# Patient Record
Sex: Male | Born: 1935 | Race: White | Hispanic: No | Marital: Married
Health system: Southern US, Community
[De-identification: ages and names within clinical notes are randomized; demographics above are authoritative.]

## PROBLEM LIST (undated history)

## (undated) DIAGNOSIS — K589 Irritable bowel syndrome without diarrhea: Secondary | ICD-10-CM

## (undated) DIAGNOSIS — N281 Cyst of kidney, acquired: Secondary | ICD-10-CM

## (undated) DIAGNOSIS — F329 Major depressive disorder, single episode, unspecified: Secondary | ICD-10-CM

## (undated) DIAGNOSIS — I483 Typical atrial flutter: Secondary | ICD-10-CM

## (undated) DIAGNOSIS — I501 Left ventricular failure: Secondary | ICD-10-CM

## (undated) DIAGNOSIS — I499 Cardiac arrhythmia, unspecified: Secondary | ICD-10-CM

## (undated) DIAGNOSIS — I2510 Atherosclerotic heart disease of native coronary artery without angina pectoris: Secondary | ICD-10-CM

## (undated) DIAGNOSIS — E785 Hyperlipidemia, unspecified: Secondary | ICD-10-CM

## (undated) DIAGNOSIS — J449 Chronic obstructive pulmonary disease, unspecified: Secondary | ICD-10-CM

## (undated) DIAGNOSIS — I10 Essential (primary) hypertension: Secondary | ICD-10-CM

## (undated) DIAGNOSIS — N390 Urinary tract infection, site not specified: Secondary | ICD-10-CM

## (undated) DIAGNOSIS — C801 Malignant (primary) neoplasm, unspecified: Secondary | ICD-10-CM

## (undated) DIAGNOSIS — I480 Paroxysmal atrial fibrillation: Secondary | ICD-10-CM

## (undated) DIAGNOSIS — I714 Abdominal aortic aneurysm, without rupture, unspecified: Secondary | ICD-10-CM

## (undated) DIAGNOSIS — M1990 Unspecified osteoarthritis, unspecified site: Secondary | ICD-10-CM

## (undated) DIAGNOSIS — J189 Pneumonia, unspecified organism: Secondary | ICD-10-CM

## (undated) DIAGNOSIS — F32A Depression, unspecified: Secondary | ICD-10-CM

## (undated) DIAGNOSIS — D649 Anemia, unspecified: Secondary | ICD-10-CM

## (undated) DIAGNOSIS — E079 Disorder of thyroid, unspecified: Secondary | ICD-10-CM

## (undated) DIAGNOSIS — I6529 Occlusion and stenosis of unspecified carotid artery: Secondary | ICD-10-CM

## (undated) DIAGNOSIS — I509 Heart failure, unspecified: Secondary | ICD-10-CM

## (undated) DIAGNOSIS — I82409 Acute embolism and thrombosis of unspecified deep veins of unspecified lower extremity: Secondary | ICD-10-CM

## (undated) DIAGNOSIS — IMO0001 Reserved for inherently not codable concepts without codable children: Secondary | ICD-10-CM

## (undated) DIAGNOSIS — K760 Fatty (change of) liver, not elsewhere classified: Secondary | ICD-10-CM

## (undated) DIAGNOSIS — K219 Gastro-esophageal reflux disease without esophagitis: Secondary | ICD-10-CM

## (undated) HISTORY — PX: KNEE SURGERY: SHX244

## (undated) HISTORY — PX: EYE SURGERY: SHX253

## (undated) HISTORY — DX: Pneumonia, unspecified organism: J18.9

## (undated) HISTORY — PX: INGUINAL HERNIA REPAIR: SUR1180

## (undated) HISTORY — DX: Abdominal aortic aneurysm, without rupture, unspecified: I71.40

## (undated) HISTORY — DX: Paroxysmal atrial fibrillation: I48.0

## (undated) HISTORY — PX: HERNIA REPAIR: SHX51

## (undated) HISTORY — PX: CARDIAC CATHETERIZATION: SHX172

## (undated) HISTORY — DX: Disorder of thyroid, unspecified: E07.9

## (undated) HISTORY — DX: Irritable bowel syndrome, unspecified: K58.9

## (undated) HISTORY — DX: Hyperlipidemia, unspecified: E78.5

## (undated) HISTORY — PX: CORONARY ANGIOPLASTY: SHX604

## (undated) HISTORY — DX: Anemia, unspecified: D64.9

## (undated) HISTORY — DX: Essential (primary) hypertension: I10

## (undated) HISTORY — DX: Acute embolism and thrombosis of unspecified deep veins of unspecified lower extremity: I82.409

## (undated) HISTORY — DX: Malignant (primary) neoplasm, unspecified: C80.1

## (undated) HISTORY — DX: Typical atrial flutter: I48.3

## (undated) HISTORY — DX: Depression, unspecified: F32.A

## (undated) HISTORY — DX: Gastro-esophageal reflux disease without esophagitis: K21.9

## (undated) HISTORY — DX: Abdominal aortic aneurysm, without rupture: I71.4

## (undated) HISTORY — DX: Atherosclerotic heart disease of native coronary artery without angina pectoris: I25.10

## (undated) HISTORY — DX: Heart failure, unspecified: I50.9

## (undated) HISTORY — DX: Major depressive disorder, single episode, unspecified: F32.9

## (undated) HISTORY — DX: Occlusion and stenosis of unspecified carotid artery: I65.29

## (undated) HISTORY — DX: Fatty (change of) liver, not elsewhere classified: K76.0

## (undated) HISTORY — DX: Left ventricular failure, unspecified: I50.1

## (undated) HISTORY — DX: Unspecified osteoarthritis, unspecified site: M19.90

---

## 1990-12-26 HISTORY — PX: CORONARY ARTERY BYPASS GRAFT: SHX141

## 2000-12-26 HISTORY — PX: ABDOMINAL AORTIC ANEURYSM REPAIR: SUR1152

## 2001-05-18 ENCOUNTER — Ambulatory Visit (HOSPITAL_COMMUNITY): Admission: RE | Admit: 2001-05-18 | Discharge: 2001-05-18 | Payer: Self-pay | Admitting: *Deleted

## 2001-05-22 ENCOUNTER — Inpatient Hospital Stay (HOSPITAL_COMMUNITY): Admission: AD | Admit: 2001-05-22 | Discharge: 2001-05-24 | Payer: Self-pay | Admitting: Cardiology

## 2001-05-22 ENCOUNTER — Encounter: Payer: Self-pay | Admitting: Cardiology

## 2001-05-23 ENCOUNTER — Encounter: Payer: Self-pay | Admitting: Cardiology

## 2001-09-14 ENCOUNTER — Encounter (INDEPENDENT_AMBULATORY_CARE_PROVIDER_SITE_OTHER): Payer: Self-pay | Admitting: *Deleted

## 2003-08-27 HISTORY — PX: QUADRICEPS TENDON REPAIR: SHX756

## 2003-09-07 ENCOUNTER — Encounter: Payer: Self-pay | Admitting: Emergency Medicine

## 2003-09-07 ENCOUNTER — Encounter: Payer: Self-pay | Admitting: Orthopedic Surgery

## 2003-09-07 ENCOUNTER — Inpatient Hospital Stay (HOSPITAL_COMMUNITY): Admission: EM | Admit: 2003-09-07 | Discharge: 2003-09-11 | Payer: Self-pay | Admitting: Emergency Medicine

## 2003-09-09 ENCOUNTER — Encounter: Payer: Self-pay | Admitting: Orthopedic Surgery

## 2003-09-10 ENCOUNTER — Encounter: Payer: Self-pay | Admitting: Internal Medicine

## 2003-09-11 ENCOUNTER — Inpatient Hospital Stay (HOSPITAL_COMMUNITY)
Admission: RE | Admit: 2003-09-11 | Discharge: 2003-09-18 | Payer: Self-pay | Admitting: Physical Medicine & Rehabilitation

## 2003-12-27 HISTORY — PX: CORONARY ANGIOPLASTY WITH STENT PLACEMENT: SHX49

## 2004-01-21 ENCOUNTER — Ambulatory Visit (HOSPITAL_COMMUNITY): Admission: RE | Admit: 2004-01-21 | Discharge: 2004-01-22 | Payer: Self-pay | Admitting: Cardiology

## 2004-11-23 ENCOUNTER — Ambulatory Visit: Payer: Self-pay | Admitting: Family Medicine

## 2004-12-30 ENCOUNTER — Ambulatory Visit: Payer: Self-pay | Admitting: Cardiology

## 2005-01-11 ENCOUNTER — Ambulatory Visit: Payer: Self-pay

## 2005-02-16 ENCOUNTER — Ambulatory Visit: Payer: Self-pay | Admitting: Family Medicine

## 2005-02-22 ENCOUNTER — Ambulatory Visit: Payer: Self-pay | Admitting: Family Medicine

## 2005-03-21 ENCOUNTER — Ambulatory Visit: Payer: Self-pay | Admitting: Family Medicine

## 2005-06-23 ENCOUNTER — Ambulatory Visit: Payer: Self-pay | Admitting: Family Medicine

## 2005-08-14 ENCOUNTER — Inpatient Hospital Stay (HOSPITAL_COMMUNITY): Admission: EM | Admit: 2005-08-14 | Discharge: 2005-08-15 | Payer: Self-pay | Admitting: Emergency Medicine

## 2005-08-14 ENCOUNTER — Ambulatory Visit: Payer: Self-pay | Admitting: Internal Medicine

## 2005-08-18 ENCOUNTER — Ambulatory Visit: Payer: Self-pay | Admitting: Family Medicine

## 2005-08-25 ENCOUNTER — Ambulatory Visit: Payer: Self-pay | Admitting: Family Medicine

## 2005-09-01 ENCOUNTER — Ambulatory Visit: Payer: Self-pay | Admitting: Family Medicine

## 2005-09-16 ENCOUNTER — Ambulatory Visit: Payer: Self-pay | Admitting: Family Medicine

## 2005-09-28 ENCOUNTER — Ambulatory Visit: Payer: Self-pay | Admitting: Family Medicine

## 2005-10-12 ENCOUNTER — Ambulatory Visit: Payer: Self-pay | Admitting: Family Medicine

## 2005-11-02 ENCOUNTER — Ambulatory Visit: Payer: Self-pay | Admitting: Family Medicine

## 2005-11-10 ENCOUNTER — Ambulatory Visit: Payer: Self-pay | Admitting: Family Medicine

## 2005-11-18 ENCOUNTER — Ambulatory Visit: Payer: Self-pay

## 2005-12-13 ENCOUNTER — Ambulatory Visit: Payer: Self-pay | Admitting: Family Medicine

## 2006-01-13 ENCOUNTER — Ambulatory Visit: Payer: Self-pay | Admitting: Family Medicine

## 2006-01-23 ENCOUNTER — Ambulatory Visit: Payer: Self-pay | Admitting: Cardiology

## 2006-01-27 ENCOUNTER — Ambulatory Visit: Payer: Self-pay | Admitting: Family Medicine

## 2006-02-13 ENCOUNTER — Ambulatory Visit: Payer: Self-pay

## 2006-02-23 ENCOUNTER — Ambulatory Visit: Payer: Self-pay | Admitting: Family Medicine

## 2006-03-27 ENCOUNTER — Ambulatory Visit: Payer: Self-pay | Admitting: Family Medicine

## 2006-04-25 ENCOUNTER — Ambulatory Visit: Payer: Self-pay | Admitting: Family Medicine

## 2006-04-26 ENCOUNTER — Ambulatory Visit: Payer: Self-pay | Admitting: Family Medicine

## 2006-05-10 ENCOUNTER — Ambulatory Visit: Payer: Self-pay | Admitting: Family Medicine

## 2006-05-30 ENCOUNTER — Ambulatory Visit: Payer: Self-pay | Admitting: Family Medicine

## 2006-06-27 ENCOUNTER — Ambulatory Visit: Payer: Self-pay | Admitting: Family Medicine

## 2006-07-27 ENCOUNTER — Ambulatory Visit: Payer: Self-pay | Admitting: Family Medicine

## 2006-08-31 ENCOUNTER — Ambulatory Visit: Payer: Self-pay | Admitting: Family Medicine

## 2006-09-19 ENCOUNTER — Ambulatory Visit: Payer: Self-pay | Admitting: Family Medicine

## 2006-10-03 ENCOUNTER — Ambulatory Visit: Payer: Self-pay | Admitting: Family Medicine

## 2006-10-31 ENCOUNTER — Ambulatory Visit: Payer: Self-pay | Admitting: Family Medicine

## 2006-11-01 ENCOUNTER — Ambulatory Visit: Payer: Self-pay | Admitting: Gastroenterology

## 2006-11-09 ENCOUNTER — Ambulatory Visit: Payer: Self-pay | Admitting: Family Medicine

## 2006-12-01 ENCOUNTER — Ambulatory Visit: Payer: Self-pay | Admitting: Family Medicine

## 2006-12-26 DIAGNOSIS — K760 Fatty (change of) liver, not elsewhere classified: Secondary | ICD-10-CM

## 2006-12-26 HISTORY — DX: Fatty (change of) liver, not elsewhere classified: K76.0

## 2007-01-02 ENCOUNTER — Ambulatory Visit: Payer: Self-pay | Admitting: Family Medicine

## 2007-01-04 ENCOUNTER — Ambulatory Visit: Payer: Self-pay | Admitting: Gastroenterology

## 2007-01-04 LAB — CONVERTED CEMR LAB: BUN: 22 mg/dL (ref 6–23)

## 2007-01-08 ENCOUNTER — Ambulatory Visit: Payer: Self-pay | Admitting: Cardiology

## 2007-02-13 ENCOUNTER — Ambulatory Visit: Payer: Self-pay | Admitting: Family Medicine

## 2007-02-27 ENCOUNTER — Ambulatory Visit: Payer: Self-pay | Admitting: Gastroenterology

## 2007-03-08 ENCOUNTER — Encounter (INDEPENDENT_AMBULATORY_CARE_PROVIDER_SITE_OTHER): Payer: Self-pay | Admitting: Specialist

## 2007-03-08 ENCOUNTER — Ambulatory Visit: Payer: Self-pay | Admitting: Gastroenterology

## 2007-03-08 ENCOUNTER — Encounter (INDEPENDENT_AMBULATORY_CARE_PROVIDER_SITE_OTHER): Payer: Self-pay | Admitting: *Deleted

## 2007-03-13 ENCOUNTER — Ambulatory Visit: Payer: Self-pay | Admitting: Family Medicine

## 2007-03-28 ENCOUNTER — Ambulatory Visit: Payer: Self-pay | Admitting: Family Medicine

## 2007-04-19 ENCOUNTER — Ambulatory Visit: Payer: Self-pay | Admitting: Family Medicine

## 2007-05-31 ENCOUNTER — Ambulatory Visit: Payer: Self-pay | Admitting: Family Medicine

## 2007-06-14 ENCOUNTER — Encounter: Payer: Self-pay | Admitting: Family Medicine

## 2007-06-14 DIAGNOSIS — Z86718 Personal history of other venous thrombosis and embolism: Secondary | ICD-10-CM

## 2007-06-14 DIAGNOSIS — E1169 Type 2 diabetes mellitus with other specified complication: Secondary | ICD-10-CM

## 2007-06-14 DIAGNOSIS — I10 Essential (primary) hypertension: Secondary | ICD-10-CM

## 2007-06-14 DIAGNOSIS — E1159 Type 2 diabetes mellitus with other circulatory complications: Secondary | ICD-10-CM

## 2007-06-14 DIAGNOSIS — E785 Hyperlipidemia, unspecified: Secondary | ICD-10-CM

## 2007-06-14 DIAGNOSIS — K219 Gastro-esophageal reflux disease without esophagitis: Secondary | ICD-10-CM | POA: Insufficient documentation

## 2007-07-03 ENCOUNTER — Ambulatory Visit: Payer: Self-pay | Admitting: Family Medicine

## 2007-07-31 ENCOUNTER — Ambulatory Visit: Payer: Self-pay | Admitting: Family Medicine

## 2007-07-31 LAB — CONVERTED CEMR LAB
INR: 3.1
Prothrombin Time: 21.3 s

## 2007-08-28 ENCOUNTER — Ambulatory Visit: Payer: Self-pay | Admitting: Family Medicine

## 2007-08-28 LAB — CONVERTED CEMR LAB
INR: 1.9
Prothrombin Time: 17.2 s

## 2007-10-16 ENCOUNTER — Ambulatory Visit: Payer: Self-pay | Admitting: Family Medicine

## 2007-10-16 LAB — CONVERTED CEMR LAB: INR: 2.6

## 2007-10-25 ENCOUNTER — Ambulatory Visit: Payer: Self-pay | Admitting: Cardiology

## 2007-11-06 ENCOUNTER — Encounter: Payer: Self-pay | Admitting: Family Medicine

## 2007-11-06 ENCOUNTER — Ambulatory Visit: Payer: Self-pay

## 2007-11-20 ENCOUNTER — Ambulatory Visit: Payer: Self-pay | Admitting: Family Medicine

## 2007-11-20 ENCOUNTER — Ambulatory Visit: Payer: Self-pay | Admitting: Vascular Surgery

## 2007-11-20 DIAGNOSIS — E1165 Type 2 diabetes mellitus with hyperglycemia: Secondary | ICD-10-CM | POA: Insufficient documentation

## 2007-11-20 DIAGNOSIS — E118 Type 2 diabetes mellitus with unspecified complications: Secondary | ICD-10-CM

## 2007-11-20 DIAGNOSIS — IMO0002 Reserved for concepts with insufficient information to code with codable children: Secondary | ICD-10-CM | POA: Insufficient documentation

## 2007-11-21 ENCOUNTER — Ambulatory Visit: Payer: Self-pay | Admitting: Family Medicine

## 2007-12-05 LAB — CONVERTED CEMR LAB
BUN: 21 mg/dL (ref 6–23)
Basophils Absolute: 0 10*3/uL (ref 0.0–0.1)
Chloride: 101 meq/L (ref 96–112)
Eosinophils Relative: 4 % (ref 0–5)
Glucose, Bld: 306 mg/dL — ABNORMAL HIGH (ref 70–99)
HCT: 41.2 % (ref 39.0–52.0)
Hgb A1c MFr Bld: 12.4 % — ABNORMAL HIGH (ref 4.6–6.1)
LDL Cholesterol: 84 mg/dL (ref 0–99)
Lymphocytes Relative: 24 % (ref 12–46)
Monocytes Relative: 10 % (ref 3–12)
Neutro Abs: 3.3 10*3/uL (ref 1.7–7.7)
Neutrophils Relative %: 62 % (ref 43–77)
PSA, Free: 0.3 ng/mL
PSA: 1.38 ng/mL (ref 0.10–4.00)
RDW: 12.5 % (ref 11.5–15.5)
Sodium: 141 meq/L (ref 135–145)
Total CHOL/HDL Ratio: 4.4
VLDL: 44 mg/dL — ABNORMAL HIGH (ref 0–40)
WBC: 5.4 10*3/uL (ref 4.0–10.5)

## 2007-12-10 ENCOUNTER — Encounter: Payer: Self-pay | Admitting: Family Medicine

## 2007-12-10 ENCOUNTER — Encounter: Admission: RE | Admit: 2007-12-10 | Discharge: 2007-12-11 | Payer: Self-pay | Admitting: Family Medicine

## 2008-01-16 ENCOUNTER — Telehealth: Payer: Self-pay | Admitting: Family Medicine

## 2008-01-29 ENCOUNTER — Ambulatory Visit: Payer: Self-pay | Admitting: Family Medicine

## 2008-01-29 DIAGNOSIS — K589 Irritable bowel syndrome without diarrhea: Secondary | ICD-10-CM

## 2008-02-21 LAB — CONVERTED CEMR LAB
Hgb A1c MFr Bld: 9.5 % — ABNORMAL HIGH (ref 4.6–6.0)
Microalb Creat Ratio: 58.5 mg/g — ABNORMAL HIGH (ref 0.0–30.0)
Microalb, Ur: 11.2 mg/dL — ABNORMAL HIGH (ref 0.0–1.9)

## 2008-03-12 ENCOUNTER — Telehealth: Payer: Self-pay | Admitting: Family Medicine

## 2008-05-15 ENCOUNTER — Ambulatory Visit: Payer: Self-pay | Admitting: *Deleted

## 2008-05-22 ENCOUNTER — Ambulatory Visit: Payer: Self-pay | Admitting: Cardiology

## 2008-05-27 ENCOUNTER — Ambulatory Visit: Payer: Self-pay | Admitting: Family Medicine

## 2008-05-27 DIAGNOSIS — M1990 Unspecified osteoarthritis, unspecified site: Secondary | ICD-10-CM | POA: Insufficient documentation

## 2008-09-25 ENCOUNTER — Ambulatory Visit: Payer: Self-pay | Admitting: Family Medicine

## 2008-10-30 ENCOUNTER — Ambulatory Visit: Payer: Self-pay | Admitting: Cardiology

## 2008-11-28 ENCOUNTER — Telehealth: Payer: Self-pay | Admitting: Family Medicine

## 2008-12-10 ENCOUNTER — Ambulatory Visit: Payer: Self-pay | Admitting: Cardiology

## 2008-12-10 ENCOUNTER — Inpatient Hospital Stay (HOSPITAL_COMMUNITY): Admission: AD | Admit: 2008-12-10 | Discharge: 2008-12-12 | Payer: Self-pay | Admitting: Cardiology

## 2008-12-12 ENCOUNTER — Encounter: Payer: Self-pay | Admitting: Cardiology

## 2009-02-03 ENCOUNTER — Ambulatory Visit: Payer: Self-pay | Admitting: Family Medicine

## 2009-02-03 DIAGNOSIS — E559 Vitamin D deficiency, unspecified: Secondary | ICD-10-CM | POA: Insufficient documentation

## 2009-02-03 DIAGNOSIS — R350 Frequency of micturition: Secondary | ICD-10-CM

## 2009-02-03 DIAGNOSIS — E039 Hypothyroidism, unspecified: Secondary | ICD-10-CM

## 2009-02-03 DIAGNOSIS — T50995A Adverse effect of other drugs, medicaments and biological substances, initial encounter: Secondary | ICD-10-CM

## 2009-02-03 DIAGNOSIS — I6529 Occlusion and stenosis of unspecified carotid artery: Secondary | ICD-10-CM

## 2009-02-03 LAB — CONVERTED CEMR LAB
Blood in Urine, dipstick: NEGATIVE
Glucose, Urine, Semiquant: NEGATIVE
Nitrite: NEGATIVE
Specific Gravity, Urine: 1.015

## 2009-02-03 LAB — HM COLONOSCOPY

## 2009-02-05 ENCOUNTER — Encounter: Payer: Self-pay | Admitting: Family Medicine

## 2009-02-05 ENCOUNTER — Ambulatory Visit: Payer: Self-pay

## 2009-02-09 LAB — CONVERTED CEMR LAB
ALT: 41 units/L (ref 0–53)
AST: 34 units/L (ref 0–37)
Albumin: 4 g/dL (ref 3.5–5.2)
Alkaline Phosphatase: 34 units/L — ABNORMAL LOW (ref 39–117)
BUN: 31 mg/dL — ABNORMAL HIGH (ref 6–23)
Basophils Absolute: 0 10*3/uL (ref 0.0–0.1)
Basophils Relative: 0.7 % (ref 0.0–3.0)
CO2: 31 meq/L (ref 19–32)
Calcium: 9.5 mg/dL (ref 8.4–10.5)
Cholesterol: 137 mg/dL (ref 0–200)
Eosinophils Absolute: 0.2 10*3/uL (ref 0.0–0.7)
GFR calc Af Amer: 47 mL/min
Glucose, Bld: 182 mg/dL — ABNORMAL HIGH (ref 70–99)
HCT: 39.4 % (ref 39.0–52.0)
HDL: 35.9 mg/dL — ABNORMAL LOW (ref >39.0)
Hemoglobin: 13.4 g/dL (ref 13.0–17.0)
Hgb A1c MFr Bld: 7.9 % — ABNORMAL HIGH (ref 4.6–6.0)
Monocytes Absolute: 0.5 10*3/uL (ref 0.1–1.0)
Monocytes Relative: 9 % (ref 3.0–12.0)
Neutro Abs: 3.7 10*3/uL (ref 1.4–7.7)
Potassium: 3.8 meq/L (ref 3.5–5.1)
RBC: 4.04 M/uL — ABNORMAL LOW (ref 4.22–5.81)
RDW: 11.7 % (ref 11.5–14.6)
Total CHOL/HDL Ratio: 3.8
Total Protein: 7.1 g/dL (ref 6.0–8.3)
Triglycerides: 143 mg/dL (ref 0–149)

## 2009-02-26 ENCOUNTER — Ambulatory Visit: Payer: Self-pay | Admitting: Cardiology

## 2009-02-26 ENCOUNTER — Ambulatory Visit: Payer: Self-pay | Admitting: Family Medicine

## 2009-02-26 LAB — CONVERTED CEMR LAB
OCCULT 1: NEGATIVE
OCCULT 3: NEGATIVE

## 2009-03-02 ENCOUNTER — Encounter: Payer: Self-pay | Admitting: Family Medicine

## 2009-05-07 ENCOUNTER — Ambulatory Visit: Payer: Self-pay | Admitting: Family Medicine

## 2009-05-21 ENCOUNTER — Ambulatory Visit: Payer: Self-pay | Admitting: *Deleted

## 2009-05-26 LAB — CONVERTED CEMR LAB: Hgb A1c MFr Bld: 6.9 % — ABNORMAL HIGH (ref 4.6–6.5)

## 2009-06-01 ENCOUNTER — Ambulatory Visit: Payer: Self-pay | Admitting: Cardiology

## 2009-11-03 ENCOUNTER — Telehealth (INDEPENDENT_AMBULATORY_CARE_PROVIDER_SITE_OTHER): Payer: Self-pay | Admitting: *Deleted

## 2009-11-04 ENCOUNTER — Ambulatory Visit: Payer: Self-pay | Admitting: Family Medicine

## 2010-01-12 ENCOUNTER — Ambulatory Visit: Payer: Self-pay | Admitting: Vascular Surgery

## 2010-02-09 ENCOUNTER — Encounter: Payer: Self-pay | Admitting: Family Medicine

## 2010-02-10 ENCOUNTER — Encounter: Payer: Self-pay | Admitting: Cardiology

## 2010-02-10 ENCOUNTER — Ambulatory Visit: Payer: Self-pay

## 2010-02-23 ENCOUNTER — Ambulatory Visit: Payer: Self-pay | Admitting: Cardiology

## 2010-02-23 DIAGNOSIS — I25810 Atherosclerosis of coronary artery bypass graft(s) without angina pectoris: Secondary | ICD-10-CM

## 2010-03-16 ENCOUNTER — Encounter (INDEPENDENT_AMBULATORY_CARE_PROVIDER_SITE_OTHER): Payer: Self-pay | Admitting: *Deleted

## 2010-03-18 ENCOUNTER — Ambulatory Visit: Payer: Self-pay | Admitting: Family Medicine

## 2010-03-18 ENCOUNTER — Encounter (INDEPENDENT_AMBULATORY_CARE_PROVIDER_SITE_OTHER): Payer: Self-pay | Admitting: *Deleted

## 2010-03-18 DIAGNOSIS — F325 Major depressive disorder, single episode, in full remission: Secondary | ICD-10-CM | POA: Insufficient documentation

## 2010-03-18 DIAGNOSIS — E669 Obesity, unspecified: Secondary | ICD-10-CM | POA: Insufficient documentation

## 2010-03-23 LAB — CONVERTED CEMR LAB
ALT: 51 units/L (ref 0–53)
AST: 37 units/L (ref 0–37)
Albumin: 4.1 g/dL (ref 3.5–5.2)
Alkaline Phosphatase: 40 units/L (ref 39–117)
Basophils Absolute: 0 10*3/uL (ref 0.0–0.1)
Basophils Relative: 0.4 % (ref 0.0–3.0)
Calcium: 9.3 mg/dL (ref 8.4–10.5)
Cholesterol: 147 mg/dL (ref 0–200)
Creatinine,U: 138.4 mg/dL
Eosinophils Relative: 3.9 % (ref 0.0–5.0)
HCT: 38.7 % — ABNORMAL LOW (ref 39.0–52.0)
HDL: 42 mg/dL (ref >39.00)
Hemoglobin: 13 g/dL (ref 13.0–17.0)
Hgb A1c MFr Bld: 8.6 % — ABNORMAL HIGH (ref 4.6–6.5)
Lymphs Abs: 1 10*3/uL (ref 0.7–4.0)
MCHC: 33.5 g/dL (ref 30.0–36.0)
MCV: 97.8 fL (ref 78.0–100.0)
Microalb Creat Ratio: 328.8 mg/g — ABNORMAL HIGH (ref 0.0–30.0)
Monocytes Absolute: 0.5 10*3/uL (ref 0.1–1.0)
Neutro Abs: 3.6 10*3/uL (ref 1.4–7.7)
PSA: 2.59 ng/mL (ref 0.10–4.00)
Platelets: 165 10*3/uL (ref 150.0–400.0)
Sodium: 142 meq/L (ref 135–145)
TSH: 1.77 microintl units/mL (ref 0.35–5.50)
Vit D, 25-Hydroxy: 36 ng/mL (ref 30–89)

## 2010-03-30 ENCOUNTER — Ambulatory Visit: Payer: Self-pay | Admitting: Family Medicine

## 2010-04-06 ENCOUNTER — Ambulatory Visit: Payer: Self-pay | Admitting: Family Medicine

## 2010-04-06 DIAGNOSIS — R609 Edema, unspecified: Secondary | ICD-10-CM

## 2010-04-08 ENCOUNTER — Ambulatory Visit: Payer: Self-pay | Admitting: Gastroenterology

## 2010-04-08 ENCOUNTER — Encounter (INDEPENDENT_AMBULATORY_CARE_PROVIDER_SITE_OTHER): Payer: Self-pay | Admitting: *Deleted

## 2010-04-10 LAB — CONVERTED CEMR LAB
OCCULT 1: NEGATIVE
OCCULT 2: NEGATIVE
OCCULT 3: NEGATIVE

## 2010-04-13 ENCOUNTER — Encounter: Payer: Self-pay | Admitting: Family Medicine

## 2010-05-12 ENCOUNTER — Ambulatory Visit: Payer: Self-pay | Admitting: Gastroenterology

## 2010-05-12 DIAGNOSIS — Z860100 Personal history of colon polyps, unspecified: Secondary | ICD-10-CM | POA: Insufficient documentation

## 2010-07-01 ENCOUNTER — Ambulatory Visit: Payer: Self-pay | Admitting: Family Medicine

## 2010-07-06 LAB — CONVERTED CEMR LAB: Hgb A1c MFr Bld: 8.9 % — ABNORMAL HIGH (ref 4.6–6.5)

## 2010-07-08 ENCOUNTER — Ambulatory Visit: Payer: Self-pay | Admitting: Family Medicine

## 2010-07-20 ENCOUNTER — Ambulatory Visit: Payer: Self-pay | Admitting: Vascular Surgery

## 2010-08-31 ENCOUNTER — Encounter: Payer: Self-pay | Admitting: Family Medicine

## 2010-08-31 ENCOUNTER — Ambulatory Visit: Payer: Self-pay | Admitting: Vascular Surgery

## 2010-09-09 ENCOUNTER — Ambulatory Visit: Payer: Self-pay | Admitting: Family Medicine

## 2010-09-10 ENCOUNTER — Ambulatory Visit: Payer: Self-pay | Admitting: Cardiology

## 2010-11-09 ENCOUNTER — Ambulatory Visit: Payer: Self-pay | Admitting: Cardiology

## 2010-11-09 DIAGNOSIS — I2510 Atherosclerotic heart disease of native coronary artery without angina pectoris: Secondary | ICD-10-CM | POA: Insufficient documentation

## 2010-11-10 ENCOUNTER — Telehealth: Payer: Self-pay | Admitting: Cardiology

## 2010-11-10 LAB — CONVERTED CEMR LAB
AST: 39 units/L — ABNORMAL HIGH (ref 0–37)
Albumin: 4.1 g/dL (ref 3.5–5.2)
Alkaline Phosphatase: 38 units/L — ABNORMAL LOW (ref 39–117)
Bilirubin, Direct: 0.1 mg/dL (ref 0.0–0.3)
HDL: 41.2 mg/dL (ref >39.00)
Total Bilirubin: 0.6 mg/dL (ref 0.3–1.2)
VLDL: 28.6 mg/dL (ref 0.0–40.0)

## 2011-01-04 ENCOUNTER — Encounter: Payer: Self-pay | Admitting: Family Medicine

## 2011-01-06 ENCOUNTER — Encounter: Payer: Self-pay | Admitting: Family Medicine

## 2011-01-27 NOTE — Letter (Signed)
Summary: [facility]  [facility]   Imported By: Laural Benes 09/20/2010 15:31:31  _____________________________________________________________________  External Attachment:    Type:   Image     Comment:   External Document

## 2011-01-27 NOTE — Assessment & Plan Note (Signed)
Summary: DIABETES F/U / REVIEW [place] // RS   Vital Signs:  Patient profile:   77 year old male Weight:      214 pounds BMI:     33.64 O2 Sat:      93 % Temp:     98 degrees F Pulse rate:   104 / minute Pulse rhythm:   regular BP sitting:   136 / 64  (left arm) Cuff size:   large  Vitals Entered By: Levora Angel, RN (July 08, 2010 9:20 AM) CC: go over labs states "feeling great"  Is Patient Diabetic? Yes Did you bring your meter with you today? 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 with lab tests revealing hemoglobin A1c of 89 He admits he does not follow the diet and has more snacks cookies and sweets His other complaints of that of a pack cough from postnasal drainage and would like something for this  Cardiac-wise no chest pain no shortness of breath or unusual shortness of breath blood pressure has been controlled       Allergies (verified): No Known Drug Allergies  Past History:  Past Medical History: Last updated: 05/06/2010 CORONARY ARTERY DISEASE (ICD-414.00) LEFT VENTRICULAR FAILURE (ICD-428.1) CAROTID ARTERY STENOSIS, RIGHT (ICD-433.10) HYPERTENSION (ICD-401.9) HYPERLIPIDEMIA (ICD-272.4) COUMADIN THERAPY (ICD-V58.61) DVT, HX OF (ICD-V12.51) GERD (ICD-530.81) DIABETES MELLITUS, TYPE II, UNCONTROLLED (ICD-250.02) SPECIAL SCREENING MALIGNANT NEOPLASM OF PROSTATE (ICD-V76.44) HYPOTHYROIDISM (ICD-244.9) ANEMIA (ICD-285.9) VITAMIN D DEFICIENCY (ICD-268.9) UNS ADVRS EFF OTH RX MEDICINAL&BIOLOGICAL SBSTNC (ICD-995.29) ARTHRITIS (ICD-716.90) IRRITABLE BOWEL SYNDROME (ICD-564.1) ENCOUNTER FOR THERAPEUTIC DRUG MONITORING (ICD-V58.83) FREQUENCY, URINARY (ICD-788.41)  Adenomatous Polyp 2008 Diverticulosis  Past Surgical History: Last updated: 04/11/2009 Abdominal aortic aneurysm repair Coronary artery bypass graft...1992 Total knee replacement  Family History: Last updated:  05/12/2010 Family History of Coronary Artery Disease: Brother Family History of Diabetes: Brother and grandmother Neg for stroke Family History of Colon Cancer: Father Brother has "abdominal cancer" unsure which organ ? 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  quit 1985 Alcohol Use - no Drug Use - no  Risk Factors: Smoking Status: quit (04/11/2009)  Review of Systems      See HPI  The patient denies anorexia, fever, weight loss, weight gain, vision loss, decreased hearing, hoarseness, chest pain, syncope, dyspnea on exertion, peripheral edema, prolonged cough, headaches, hemoptysis, abdominal pain, melena, hematochezia, severe indigestion/heartburn, hematuria, incontinence, genital sores, muscle weakness, suspicious skin lesions, transient blindness, difficulty walking, depression, unusual weight change, abnormal bleeding, enlarged lymph nodes, angioedema, breast masses, and testicular masses.    Physical Exam  General:  Well-developed,well-nourished,in no acute distress; alert,appropriate and cooperative throughout examinationoverweight-appearing.   Head:  Normocephalic and atraumatic without obvious abnormalities. No apparent alopecia or balding. Eyes:  No corneal or conjunctival inflammation noted. EOMI. Perrla. Funduscopic exam benign, without hemorrhages, exudates or papilledema. Vision grossly normal. Ears:  External ear exam shows no significant lesions or deformities.  Otoscopic examination reveals clear canals, tympanic membranes are intact bilaterally without bulging, retraction, inflammation or discharge. Hearing is grossly normal bilaterally. Nose:  swollen boggy nasal mucosa with with clear drainage anteriorly and posteriorly Mouth:  Oral mucosa and oropharynx without lesions or exudates.  Teeth in good repair. Lungs:  Normal respiratory effort, chest expands  symmetrically. Lungs are clear to auscultation, no crackles or wheezes. Heart:  Normal rate and regular rhythm. S1 and S2 normal without gallop, murmur,  click, rub or other extra sounds.   Impression & Recommendations:  Problem # 1:  DIABETES MELLITUS, TYPE II, UNCONTROLLED (ICD-250.02) Assessment Deteriorated  His updated medication list for this problem includes:    Aspirin Ec 325 Mg Tbec (Aspirin) .Marland Kitchen... Take one tablet by mouth daily    Diovan 80 Mg Tabs (Valsartan) .Marland Kitchen... Take 1 tablet by mouth once a day    Glucotrol Xl 10 Mg Tb24 (Glipizide) .Marland Kitchen..Marland Kitchen Two times a day    Glucophage 500 Mg Tabs (Metformin hcl) .Marland Kitchen... 1 two times a day for diabetes  Problem # 2:  PERIPHERAL EDEMA (ICD-782.3) Assessment: Improved  His updated medication list for this problem includes:    Furosemide 40 Mg Tabs (Furosemide) .Marland Kitchen... 1 tablet one times a day  Problem # 3:  ANXIETY DEPRESSION (ICD-300.4) Assessment: Improved  Problem # 4:  CAD, AUTOLOGOUS BYPASS GRAFT (ICD-414.02) Assessment: Improved  His updated medication list for this problem includes:    Aspirin Ec 325 Mg Tbec (Aspirin) .Marland Kitchen... Take one tablet by mouth daily    Diovan 80 Mg Tabs (Valsartan) .Marland Kitchen... Take 1 tablet by mouth once a day    Furosemide 40 Mg Tabs (Furosemide) .Marland Kitchen... 1 tablet one times a day    Nitroquick 0.4 Mg Subl (Nitroglycerin) .Marland Kitchen... Place 1 tablet under tongue as directed    Pletal 100 Mg Tabs (Cilostazol) .Marland Kitchen... Take 1 tablet by mouth twice a day    Nifedipine 30 Mg Tb24 (Nifedipine) ..... One by mouth daily    Plavix 75 Mg Tabs (Clopidogrel bisulfate) ..... Once daily    Toprol Xl 50 Mg Xr24h-tab (Metoprolol succinate) ..... One tablet once time a day  Problem # 5:  HYPERTENSION (ICD-401.9) Assessment: Improved  His updated medication list for this problem includes:    Diovan 80 Mg Tabs (Valsartan) .Marland Kitchen... Take 1 tablet by mouth once a day    Furosemide 40 Mg Tabs (Furosemide) .Marland Kitchen... 1 tablet one times a day    Nifedipine  30 Mg Tb24 (Nifedipine) ..... One by mouth daily    Toprol Xl 50 Mg Xr24h-tab (Metoprolol succinate) ..... One tablet once time a day  Problem # 6:  HYPERLIPIDEMIA (ICD-272.4) Assessment: Improved  His updated medication list for this problem includes:    Zetia 10 Mg Tabs (Ezetimibe) .Marland Kitchen... Take 1 tablet by mouth once a day    Simvastatin 80 Mg Tabs (Simvastatin) .Marland Kitchen... Take 1 at  bedtime.    Tricor 145 Mg Tabs (Fenofibrate) ..... Once daily  Problem # 7:  DVT, HX OF (ICD-V12.51) Assessment: Improved  Problem # 8:  ARTHRITIS (ICD-716.90) Assessment: Improved Celebrex 200 mg once or twice daily  Complete Medication List: 1)  Aspirin Ec 325 Mg Tbec (Aspirin) .... Take one tablet by mouth daily 2)  Alprazolam 0.25 Mg Tabs (Alprazolam) .... Take 1 tablet by mouth daily 3)  Diovan 80 Mg Tabs (Valsartan) .... Take 1 tablet by mouth once a day 4)  Furosemide 40 Mg Tabs (Furosemide) .Marland Kitchen.. 1 tablet one times a day 5)  Nitroquick 0.4 Mg Subl (Nitroglycerin) .... Place 1 tablet under tongue as directed 6)  Pletal 100 Mg Tabs (Cilostazol) .... Take 1 tablet by mouth twice a day 7)  Prozac 10 Mg Caps (Fluoxetine hcl) .... 2 qd 8)  Temazepam 30 Mg Caps (Temazepam) 9)  Zetia 10 Mg Tabs (Ezetimibe) .... Take 1 tablet by mouth once a day 10)  Celebrex 200 Mg Caps (Celecoxib) .Marland Kitchen.. 1 two times a day after meals 11)  Nifedipine 30  Mg Tb24 (Nifedipine) .... One by mouth daily 12)  Glucotrol Xl 10 Mg Tb24 (Glipizide) .... Two times a day 13)  Simvastatin 80 Mg Tabs (Simvastatin) .... Take 1 at  bedtime. 14)  Plavix 75 Mg Tabs (Clopidogrel bisulfate) .... Once daily 15)  Toprol Xl 50 Mg Xr24h-tab (Metoprolol succinate) .... One tablet once time a day 16)  Tricor 145 Mg Tabs (Fenofibrate) .... Once daily 17)  Otc Potassium  .Marland Kitchen.. 1 tab qd 18)  Fish Oil Oil (Fish oil) .... 2 tabs qd 19)  Multivitamins Tabs (Multiple vitamin) .Marland Kitchen.. 1 tab once daily 20)  Glucosamine Sulfate Powd (Glucosamine sulfate) .... 2  tabs once daily 21)  Accu-chek Aviva Strp (Glucose blood) .... Check blood sugar four times a day 22)  Glucophage 500 Mg Tabs (Metformin hcl) .Marland Kitchen.. 1 two times a day for diabetes  Patient Instructions: 1)  hGBa1c   8.9  2)  CONTINUE TO TAKE METFORMIN 500 MG two times a day 3)  GLIPIZIDE 10 MG  2 PER DAY 4)   pLEASE WATCH DIET MUCH BETTER 5)  CONTINUE  TO EXERCISE 6)  return 3 months for hemoglobin A1c Prescriptions: PROZAC 10 MG CAPS (FLUOXETINE HCL) 2 QD  #60 x 11   Entered and Authorized by:   Emeterio Reeve MD   Signed by:   Emeterio Reeve MD on 07/08/2010   Method used:   Electronically to        [facility] (retail)       [place], [place]  [id number]       Ph: [phone]       Fax: [phone]   RxID:   [id number]

## 2011-01-27 NOTE — Assessment & Plan Note (Signed)
Summary: 6 mo f/u ./cy  Medications Added FUROSEMIDE 40 MG TABS (FUROSEMIDE) 1 tablet one times a day PLETAL 100 MG TABS (CILOSTAZOL) Take 1 tablet by mouth twice a day TEMAZEPAM 30 MG CAPS (TEMAZEPAM) 1 cap at bedtime LIPITOR 40 MG TABS (ATORVASTATIN CALCIUM) Take 1 tablet at bedtime METOPROLOL TARTRATE 50 MG TABS (METOPROLOL TARTRATE) 1 tab two times a day      Allergies Added: NKDA  Visit Type:  6 mo f/u Primary Provider:  Lorenda Cahill   CC:  pt states the only time he has any chest discomfort is after any exertion...Marland Kitchenotherwise no other complaints today.  History of Present Illness: Andrew [name] times a day for his evaluation and management of coronary disease and mixed hyperlipidemia. He is having no angina or ischemic symptoms. He is on simvastatin 80 and also TriCor.  Blood work from earlier this year reviewed. His chart was rubs or increased despite this maximum therapy. His HDLs always been borderline low this time 42. LDL was not quite at goal at 78.6.  He's had a good summer. His weight is stable. He also has nonobstructive carotid disease but the studies were stable in February of this year. He is having no symptoms of TIAs or mini strokes.  Clinical Reports Reviewed:  Cardiac Cath:  12/11/2008: Cardiac Cath Findings:  HEMODYNAMIC FINDINGS:  Central aortic pressure 168/88.  Left ventricular   pressure 179/35.  End-diastolic pressure 48.      IMPRESSION:   1. Successful percutaneous coronary intervention with placement of a       bare-metal stent in a severely tortuous, ectatic right coronary       artery.   2. Single-vessel coronary artery disease status post three-vessel       coronary bypass surgery with all 3 of the grafts leading to the       right coronary artery.  Only one of these grafts is patent       currently.      RECOMMENDATIONS:  The patient will have an echocardiogram to assess his   left ventricular function.  He is to be continued on aspirin  and Plavix   for at least 6 weeks.  I would prefer that he remain on the Plavix for a   year if he tolerates this medication.  His end-diastolic filling   pressures are also elevated because of this, I think he would benefit   from some diuresis.               Lauree Chandler, MD   Electronically Signed      01/21/2004: Cardiac Cath Findings:   IMPRESSION/RECOMMENDATIONS:  Successful drug-eluding stent placement in the  severe stenosis of the posterior descending artery ostium.  Will plan on a  trial of medical therapy for the significant posterior left ventricle  lesion.  Its small size would be accompanied by a high restenosis rate.  It  is technically amenable to percutaneous coronary intervention should his  symptoms warrant.  I will treat him with Plavix for 1 year.  Aspirin will be  continued indefinitely.                                             Ethelle Lyon, M.D.  Carotid Doppler:  02/10/2010:  Impressions: Technically challenging study. Stable, mild carotid artery disease, bilaterally. 40-59% bilateral ICA stenosis.  Legrand Como  Burt Knack, MD  02/10/2010:  Impressions:  Technically challenging study. Stable, mild cartoid artery disease, bilaterally. 40-59% bilateral ICA stenosis.  Sherren Mocha, MD  Nuclear Study:  11/06/2007:  Excerise capacity: Adenosine study with no exercise  Blood Pressure response: Normal blood pressure response  Clinical symptoms:  ECG impression: Baseline: NSR;. No significant ST segement change with addenosine.  Overall impression: Abnormal stress nuclear study. Previous anterolateral infarct with mild to moderate peri-infarct ischemia. EF 37%.  Glori Bickers, MD      01/11/2005:  Final Interpretation: Abnormal Myoview with no diagnostic electrocardiographic changes. The scnitigraphic results showed a prior lateral infarct, and there is mild to moderate peri-infarct ischemia. The gated ejection fraction was 41%, and  there was akinesis at the lateral wall.  Biran S. Stanford Breed, MD, [facility]  Lipid Panel: 03/18/2010:  Chol:  147   HDL:  42.00    02/03/2009:  Chol:  137   HDL:  35.9   LDL:  73    11/21/2007:  Chol:  166   HDL:  38   LDL:  84      Current Medications (verified): 1)  Aspirin Ec 325 Mg Tbec (Aspirin) .... Take One Tablet By Mouth Daily 2)  Diovan 80 Mg Tabs (Valsartan) .... Take 1 Tablet By Mouth Once A Day 3)  Furosemide 40 Mg Tabs (Furosemide) .Marland Kitchen.. 1 Tablet One Times A Day 4)  Nitroquick 0.4 Mg Subl (Nitroglycerin) .... Place 1 Tablet Under Tongue As Directed 5)  Pletal 100 Mg Tabs (Cilostazol) .... Take 1 Tablet By Mouth Twice A Day 6)  Prozac 10 Mg Caps (Fluoxetine Hcl) .... 2 Qd 7)  Temazepam 30 Mg Caps (Temazepam) .Marland Kitchen.. 1 Cap At Bedtime 8)  Zetia 10 Mg Tabs (Ezetimibe) .... Take 1 Tablet By Mouth Once A Day 9)  Celebrex 200 Mg  Caps (Celecoxib) .Marland Kitchen.. 1 Two Times A Day After Meals 10)  Nifedipine 30 Mg  Tb24 (Nifedipine) .... One By Mouth Daily 11)  Glucotrol Xl 10 Mg  Tb24 (Glipizide) .... Two Times A Day 12)  Simvastatin 80 Mg  Tabs (Simvastatin) .... Take 1 At  Bedtime. 13)  Plavix 75 Mg Tabs (Clopidogrel Bisulfate) .... Once Daily 14)  Metoprolol Tartrate 50 Mg Tabs (Metoprolol Tartrate) .Marland Kitchen.. 1 Tab Two Times A Day 15)  Tricor 145 Mg Tabs (Fenofibrate) .... Once Daily 16)  Otc Potassium .Marland Kitchen.. 1 Tab Qd 17)  Fish Oil   Oil (Fish Oil) .... 2 Tabs Qd 18)  Multivitamins   Tabs (Multiple Vitamin) .Marland Kitchen.. 1 Tab Once Daily 19)  Glucosamine Sulfate   Powd (Glucosamine Sulfate) .... 2 Tabs Once Daily 20)  Accu-Chek Aviva  Strp (Glucose Blood) .... Check Blood Sugar Four Times A Day 21)  Glucophage 500 Mg Tabs (Metformin Hcl) .Marland Kitchen.. 1 Two Times A Day For Diabetes 22)  Alprazolam 0.5 Mg  Tabs (Alprazolam) .Marland Kitchen.. 1 By Mouth Three Times A Day Stress Anxiiety  Allergies (verified): No Known Drug Allergies  Past History:  Past Medical History: Last updated: 05/06/2010 CORONARY ARTERY DISEASE  (ICD-414.00) LEFT VENTRICULAR FAILURE (ICD-428.1) CAROTID ARTERY STENOSIS, RIGHT (ICD-433.10) HYPERTENSION (ICD-401.9) HYPERLIPIDEMIA (ICD-272.4) COUMADIN THERAPY (ICD-V58.61) DVT, HX OF (ICD-V12.51) GERD (ICD-530.81) DIABETES MELLITUS, TYPE II, UNCONTROLLED (ICD-250.02) SPECIAL SCREENING MALIGNANT NEOPLASM OF PROSTATE (ICD-V76.44) HYPOTHYROIDISM (ICD-244.9) ANEMIA (ICD-285.9) VITAMIN D DEFICIENCY (ICD-268.9) UNS ADVRS EFF OTH RX MEDICINAL&BIOLOGICAL SBSTNC (ICD-995.29) ARTHRITIS (ICD-716.90) IRRITABLE BOWEL SYNDROME (ICD-564.1) ENCOUNTER FOR THERAPEUTIC DRUG MONITORING (ICD-V58.83) FREQUENCY, URINARY (ICD-788.41)  Adenomatous Polyp 2008 Diverticulosis  Past Surgical History: Last updated: 04/11/2009 Abdominal  aortic aneurysm repair Coronary artery bypass graft...1992 Total knee replacement  Family History: Last updated: 05/12/2010 Family History of Coronary Artery Disease: Brother Family History of Diabetes: Brother and grandmother Neg for stroke Family History of Colon Cancer: Father Brother has "abdominal cancer" unsure which organ ? 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  quit 1985 Alcohol Use - no Drug Use - no  Risk Factors: Smoking Status: quit (04/11/2009)  Review of Systems       negative other than history of present illness  Vital Signs:  Patient profile:   77 year old male Height:      67 inches Weight:      214 pounds BMI:     33.64 Pulse rate:   100 / minute Pulse rhythm:   regular BP sitting:   118 / 70  (left arm) Cuff size:   large  Vitals Entered By: Julaine Hua, CMA (September 10, 2010 10:43 AM)  Physical Exam  General:  obese.  no acute distress Head:  normocephalic and atraumatic Eyes:  PERRLA/EOM intact; conjunctiva and lids normal. Neck:  Neck supple, no JVD. No masses, thyromegaly or abnormal cervical  nodes. Chest Wall:  no deformities or breast masses noted Lungs:  Clear bilaterally to auscultation and percussion. Heart:  PMI difficult to appreciate, normal S1-S2, no gallop. Carotids equal bilaterally without bruits Msk:  decreased ROM.   Pulses:  pulses normal in all 4 extremities Extremities:  No clubbing or cyanosis. Neurologic:  Alert and oriented x 3. Skin:  Intact without lesions or rashes. Psych:  Normal affect.   Impression & Recommendations:  Problem # 1:  CAD, AUTOLOGOUS BYPASS GRAFT (ICD-414.02) Assessment Unchanged  His updated medication list for this problem includes:    Aspirin Ec 325 Mg Tbec (Aspirin) .Marland Kitchen... Take one tablet by mouth daily    Nitroquick 0.4 Mg Subl (Nitroglycerin) .Marland Kitchen... Place 1 tablet under tongue as directed    Pletal 100 Mg Tabs (Cilostazol) .Marland Kitchen... Take 1 tablet by mouth twice a day    Nifedipine 30 Mg Tb24 (Nifedipine) ..... One by mouth daily    Plavix 75 Mg Tabs (Clopidogrel bisulfate) ..... Once daily    Metoprolol Tartrate 50 Mg Tabs (Metoprolol tartrate) .Marland Kitchen... 1 tab two times a day  Problem # 2:  CAROTID ARTERY STENOSIS, RIGHT (ICD-433.10) Assessment: Unchanged  His updated medication list for this problem includes:    Aspirin Ec 325 Mg Tbec (Aspirin) .Marland Kitchen... Take one tablet by mouth daily    Pletal 100 Mg Tabs (Cilostazol) .Marland Kitchen... Take 1 tablet by mouth twice a day    Plavix 75 Mg Tabs (Clopidogrel bisulfate) ..... Once daily  Problem # 3:  HYPERTENSION (ICD-401.9) Assessment: Improved  His updated medication list for this problem includes:    Aspirin Ec 325 Mg Tbec (Aspirin) .Marland Kitchen... Take one tablet by mouth daily    Diovan 80 Mg Tabs (Valsartan) .Marland Kitchen... Take 1 tablet by mouth once a day    Furosemide 40 Mg Tabs (Furosemide) .Marland Kitchen... 1 tablet one times a day    Nifedipine 30 Mg Tb24 (Nifedipine) ..... One by mouth daily    Metoprolol Tartrate 50 Mg Tabs (Metoprolol tartrate) .Marland Kitchen... 1 tab two times a day  Problem # 4:  HYPERLIPIDEMIA  (ICD-272.4) He needs to be changed to Lipitor 40. We'll discontinue high-dose simvastatin. Blood work in 6-8 weeks. His updated medication list for this problem includes:  Zetia 10 Mg Tabs (Ezetimibe) .Marland Kitchen... Take 1 tablet by mouth once a day    Lipitor 40 Mg Tabs (Atorvastatin calcium) .Marland Kitchen... Take 1 tablet at bedtime    Tricor 145 Mg Tabs (Fenofibrate) ..... Once daily  Patient Instructions: 1)  Your physician recommends that you schedule a follow-up appointment in: 6 months with Dr. Verl Blalock 2)  Your physician recommends that you return for a FASTING lipid profile, liver function test in 6-8 weeks272.4, 414. 3)  04.  A reminder will be sent to you to schedule this appt. 4)  Your physician has recommended you make the following change in your medication:  Prescriptions: LIPITOR 40 MG TABS (ATORVASTATIN CALCIUM) Take 1 tablet at bedtime  #30 x 11   Entered by:   Joelyn Oms RN   Authorized by:   Renella Cunas, MD, [facility]   Signed by:   Joelyn Oms RN on 09/10/2010   Method used:   Electronically to        [facility] (retail)       [place], [place]  [id number]       Ph: [phone]       Fax: [phone]   RxID:   [id number]

## 2011-01-27 NOTE — Letter (Signed)
Summary: New Patient letter  [facility]  [address] [place], [place] [address]   Phone: [phone]  Fax: [phone]       04/08/2010 MRN: [id number]  Andrew [name] [place], [place]  [id number]  Dear Andrew [name],  Welcome to the Gastroenterology Division at [facility].    You are scheduled to see Dr. Deatra Ina on 05/12/2010 at 10:00AM on the 3rd floor at [facility] [place] [employer].  We ask that you try to arrive at our office 15 minutes prior to your appointment time to allow for check-in.  We would like you to complete the enclosed self-administered evaluation form prior to your visit and bring it with you on the day of your appointment.  We will review it with you.  Also, please bring a complete list of all your medications or, if you prefer, bring the medication bottles and we will list them.  Please bring your insurance card so that we may make a copy of it.  If your insurance requires a referral to see a specialist, please bring your referral form from your primary care physician.  Co-payments are due at the time of your visit and may be paid by cash, check or credit card.     Your office visit will consist of a consult with your physician (includes a physical exam), any laboratory testing he/she may order, scheduling of any necessary diagnostic testing (e.g. x-ray, ultrasound, CT-scan), and scheduling of a procedure (e.g. Endoscopy, Colonoscopy) if required.  Please allow enough time on your schedule to allow for any/all of these possibilities.    If you cannot keep your appointment, please call [phone] to cancel or reschedule prior to your appointment date.  This allows [place] the opportunity to schedule an appointment for another patient in need of care.  If you do not cancel or reschedule by 5 p.m. the business day prior to your appointment date, you will be charged a $50.00 late cancellation/no-show fee.    Thank you for choosing  [facility] for your medical needs.  We appreciate the opportunity to care for you.  Please visit [place] at our website  to learn more about our practice.                     Sincerely,                                                             The Gastroenterology Division

## 2011-01-27 NOTE — Procedures (Signed)
Summary: colonoscopy   Andrew [name]   Colonoscopy  Procedure date:  09/14/2001  Findings:      Results: Diverticulosis.       Location:  [place].    Comments:      Repeat colonoscopy in 5 years.  Patient Name: Andrew, [name]. MRN:  Procedure Procedures: Colonoscopy CPT: [phone].  Personnel: Endoscopist: Clarene Reamer, MD.  Referred By: Bing Quarry, MD.  Exam Location: Exam performed in Outpatient Clinic. Outpatient  Patient Consent: Procedure, Alternatives, Risks and Benefits discussed, consent obtained, from patient.  Indications  Surveillance of: Adenomatous Polyp(s).  History  Pre-Exam Physical: Performed Sep 14, 2001. Cardio-pulmonary exam, Rectal exam, HEENT exam , Abdominal exam, Extremity exam, Mental status exam WNL.  Exam Exam: Extent of exam reached: Cecum, extent intended: Cecum.  The cecum was identified by appendiceal orifice and IC valve. Patient position: on left side. Colon retroflexion performed. Images were not taken. ASA Classification: II. Tolerance: good.  Monitoring: Pulse and BP monitoring, Oximetry used. Supplemental O2 given.  Colon Prep Prep results: good.  Sedation Meds: Patient assessed and found to be appropriate for moderate (conscious) sedation. Fentanyl 75 mcg. Versed 5 mg.  Findings - MUCOSAL ABNORMALITY: Ascending Colon to Sigmoid Colon.   Assessment Abnormal examination, see findings above.  Events  Unplanned Interventions: No intervention was required.  Unplanned Events: There were no complications. Plans Medication Plan: Continue current medications.  Patient Education: Patient given standard instructions for: Diverticulosis. Yearly hemoccult testing recommended. Patient instructed to get routine colonoscopy every 5 years.  Disposition: After procedure patient sent to recovery. After recovery patient sent home.   This report was created from the original endoscopy report, which was  reviewed and signed by the above listed endoscopist.

## 2011-01-27 NOTE — Letter (Signed)
Summary: Previsit letter  [facility]  [place], [place] [address]   Phone: [phone]  Fax: [phone]       03/18/2010 MRN: [id number]  DAMARE URY [place], [place]  [address]  Dear Mr. Pablo Ledger,  Welcome to the Gastroenterology Division at [facility].    You are scheduled to see a nurse for your pre-procedure visit on 4.14-11 at 10:00a.m. on the 3rd floor at [facility], [place] [employer].  We ask that you try to arrive at our office 15 minutes prior to your appointment time to allow for check-in.  Your nurse visit will consist of discussing your medical and surgical history, your immediate family medical history, and your medications.    Please bring a complete list of all your medications or, if you prefer, bring the medication bottles and we will list them.  We will need to be aware of both prescribed and over the counter drugs.  We will need to know exact dosage information as well.  If you are on blood thinners (Coumadin, Plavix, Aggrenox, Ticlid, etc.) please call our office today/prior to your appointment, as we need to consult with your physician about holding your medication.   Please be prepared to read and sign documents such as consent forms, a financial agreement, and acknowledgement forms.  If necessary, and with your consent, a friend or relative is welcome to sit-in on the nurse visit with you.  Please bring your insurance card so that we may make a copy of it.  If your insurance requires a referral to see a specialist, please bring your referral form from your primary care physician.  No co-pay is required for this nurse visit.     If you cannot keep your appointment, please call [phone] to cancel or reschedule prior to your appointment date.  This allows [place] the opportunity to schedule an appointment for another patient in need of care.    Thank you for choosing [facility] for your medical  needs.  We appreciate the opportunity to care for you.  Please visit [place] at our website  to learn more about our practice.                     Sincerely.                                                                                                                   The Gastroenterology Division

## 2011-01-27 NOTE — Letter (Signed)
Summary: Generic Letter  [name]   [place], [place] [address]   Phone: [phone]  Fax: [phone]    04/13/2010  ZALMAN GANSER [address] [place], [place]  [address]  Dear Mr. Parma,   Hemocult cards were negative.         Sincerely,   DR Haskell Riling STAFFORD,MD

## 2011-01-27 NOTE — Assessment & Plan Note (Signed)
Summary: pt req cortisone inj in back/per Dr. Felicie Morn   Vital Signs:  Patient profile:   77 year old male Weight:      220 pounds O2 Sat:      95 % Temp:     97 degrees F oral Pulse rate:   111 / minute BP sitting:   102 / 70  Vitals Entered By: Deanna Artis CMA (April 06, 2010 1:44 PM) CC: back pain Is Patient Diabetic? Yes Pain Assessment Patient in pain? yes     Location: lower back   History of Present Illness: This 77 year old white married male, nondiabetic with obesity and not complaining of pain in the back especially on the right side over the right sacroiliac joint as well as multiple in this addendum over the past 10 days duration. Has increased in severity over the past 2 days. No pain on coughing distal movement Started patient on metformin to better control his diabetes but the full tablet of metformin 500 mg b.i.d. call somehow some mental agitation so to decrease dosage to one half tab b.i.d. then recheck hemoglobin A1c in 3 months Has had persistent peripheral edema  Current Medications (verified): 1)  Aspirin Ec 325 Mg Tbec (Aspirin) .... Take One Tablet By Mouth Daily 2)  Alprazolam 0.25 Mg Tabs (Alprazolam) .... Take 1 Tablet By Mouth Daily 3)  Diovan 80 Mg Tabs (Valsartan) .... Take 1 Tablet By Mouth Once A Day 4)  Furosemide 40 Mg Tabs (Furosemide) .Marland Kitchen.. 1 Tablet One Times A Day 5)  Nitroquick 0.4 Mg Subl (Nitroglycerin) .... Place 1 Tablet Under Tongue As Directed 6)  Pletal 100 Mg Tabs (Cilostazol) .... Take 1 Tablet By Mouth Twice A Day 7)  Prozac 10 Mg Caps (Fluoxetine Hcl) .... 3 Capsule By Mouth Two Times A Day 8)  Temazepam 30 Mg Caps (Temazepam) 9)  Zetia 10 Mg Tabs (Ezetimibe) .... Take 1 Tablet By Mouth Once A Day 10)  Celebrex 200 Mg  Caps (Celecoxib) .Marland Kitchen.. 1 Two Times A Day After Meals 11)  Nifedipine 30 Mg  Tb24 (Nifedipine) .... One By Mouth Daily 12)  Glucotrol Xl 10 Mg  Tb24 (Glipizide) .... Two Times A Day 13)  Simvastatin 80 Mg  Tabs  (Simvastatin) .... Take 1 At  Bedtime. 14)  Plavix 75 Mg Tabs (Clopidogrel Bisulfate) .... Once Daily 15)  Toprol Xl 50 Mg Xr24h-Tab (Metoprolol Succinate) .... One Tablet Once Time A Day 16)  Tricor 145 Mg Tabs (Fenofibrate) .... Once Daily 17)  Otc Potassium .Marland Kitchen.. 1 Tab Qd 18)  Fish Oil   Oil (Fish Oil) .... 2 Tabs Qd 19)  Multivitamins   Tabs (Multiple Vitamin) .Marland Kitchen.. 1 Tab Once Daily 20)  Glucosamine Sulfate   Powd (Glucosamine Sulfate) .... 2 Tabs Once Daily 21)  Ketoconozole .... Apply To Affected Area Weekly For 2 Weeks 22)  Metformin Hcl 500 Mg Tabs (Metformin Hcl) .Marland Kitchen.. 1 Two Times A Day, Am, Pm For Diabetews 23)  Hydromet 5-1.5 Mg/2ml Syrp (Hydrocodone-Homatropine) .... Take 1-2 Tsp As Needed Cough 24)  Lotrisone 1-0.05 % Crea (Clotrimazole-Betamethasone) .... Apply B.i.d. For 14 Days  Allergies (verified): No Known Drug Allergies  Past History:  Past Medical History: Last updated: 04/11/2009 CORONARY ARTERY DISEASE (ICD-414.00) LEFT VENTRICULAR FAILURE (ICD-428.1) CAROTID ARTERY STENOSIS, RIGHT (ICD-433.10) HYPERTENSION (ICD-401.9) HYPERLIPIDEMIA (ICD-272.4) COUMADIN THERAPY (ICD-V58.61) DVT, HX OF (ICD-V12.51) GERD (ICD-530.81) DIABETES MELLITUS, TYPE II, UNCONTROLLED (ICD-250.02) SPECIAL SCREENING MALIGNANT NEOPLASM OF PROSTATE (ICD-V76.44) HYPOTHYROIDISM (ICD-244.9) ANEMIA (ICD-285.9) VITAMIN D DEFICIENCY (ICD-268.9) UNS ADVRS EFF  OTH RX MEDICINAL&BIOLOGICAL SBSTNC (ICD-995.29) ARTHRITIS (ICD-716.90) IRRITABLE BOWEL SYNDROME (ICD-564.1) ENCOUNTER FOR THERAPEUTIC DRUG MONITORING (ICD-V58.83) FREQUENCY, URINARY [id number])    Past Surgical History: Last updated: 04/11/2009 Abdominal aortic aneurysm repair Coronary artery bypass graft...1992 Total knee replacement  Social History: Last updated: 04/11/2009 Full Time Married  Tobacco Use - Former.  quit 1985 Alcohol Use - no Drug Use - no  Risk Factors: Smoking Status: quit (04/11/2009)  Review of  Systems      See HPI  The patient denies anorexia, fever, weight loss, weight gain, vision loss, decreased hearing, hoarseness, chest pain, syncope, dyspnea on exertion, peripheral edema, prolonged cough, headaches, hemoptysis, abdominal pain, melena, hematochezia, severe indigestion/heartburn, hematuria, incontinence, genital sores, muscle weakness, suspicious skin lesions, transient blindness, difficulty walking, depression, unusual weight change, abnormal bleeding, enlarged lymph nodes, angioedema, breast masses, and testicular masses.    Physical Exam  General:  Well-developed,well-nourished,in no acute distress; alert,appropriate and cooperative throughout examination Lungs:  Normal respiratory effort, chest expands symmetrically. Lungs are clear to auscultation, no crackles or wheezes. Heart:  Normal rate and regular rhythm. S1 and S2 normal without gallop, murmur, click, rub or other extra sounds. Abdomen:  obese, LS and J. nonpalpable Msk:  tender rt SI joint muscle spasm lumbar region no limitation straight  leg raising Extremities:  pretibial edema 1 plus  bilaterally   Impression & Recommendations:  Problem # 1:  PERIPHERAL EDEMA (ICD-782.3) Assessment Deteriorated  His updated medication list for this problem includes:    Furosemide 40 Mg Tabs (Furosemide) .Marland Kitchen... 1 tablet one times a day  Problem # 2:  SACROILIAC STRAIN, ACUTE (ICD-846.9) Assessment: New fexeril 10 mg three times a day celebrex 200 mg two times a day depomedrol 120 mg IM  Problem # 3:  LUMBOSACRAL STRAIN (ICD-846.0) Assessment: New  Flexeril 10 mg tid  Orders: Depo- Medrol 80mg  (J1040) Admin of Therapeutic Inj  intramuscular or subcutaneous [id number])  Problem # 4:  MUSCLE SPASM, BACK (ICD-724.8) Assessment: Deteriorated  His updated medication list for this problem includes:    Aspirin Ec 325 Mg Tbec (Aspirin) .Marland Kitchen... Take one tablet by mouth daily    Celebrex 200 Mg Caps (Celecoxib) .Marland Kitchen... 1 two  times a day after meals    Flexeril 10 Mg Tabs (Cyclobenzaprine hcl) .Marland Kitchen... 1 morn midafternoon and hs for muscle spasm  Orders: Prescription Created Electronically [phone])  Problem # 5:  ANXIETY DEPRESSION (ICD-300.4) Assessment: Improved  Problem # 6:  HYPERTENSION (ICD-401.9) Assessment: Improved  His updated medication list for this problem includes:    Diovan 80 Mg Tabs (Valsartan) .Marland Kitchen... Take 1 tablet by mouth once a day    Furosemide 40 Mg Tabs (Furosemide) .Marland Kitchen... 1 tablet one times a day    Nifedipine 30 Mg Tb24 (Nifedipine) ..... One by mouth daily    Toprol Xl 50 Mg Xr24h-tab (Metoprolol succinate) ..... One tablet once time a day  Problem # 7:  DIABETES MELLITUS, TYPE II, UNCONTROLLED (ICD-250.02) Assessment: Improved  His updated medication list for this problem includes:    Aspirin Ec 325 Mg Tbec (Aspirin) .Marland Kitchen... Take one tablet by mouth daily    Diovan 80 Mg Tabs (Valsartan) .Marland Kitchen... Take 1 tablet by mouth once a day    Glucotrol Xl 10 Mg Tb24 (Glipizide) .Marland Kitchen..Marland Kitchen Two times a day    Metformin Hcl 500 Mg Tabs (Metformin hcl) .Marland Kitchen... 1/2 tab hs  Problem # 8:  EXOGENOUS OBESITY (ICD-278.00) Assessment: Unchanged  Complete Medication List: 1)  Aspirin Ec 325 Mg Tbec (Aspirin) .Marland KitchenMarland KitchenMarland Kitchen  Take one tablet by mouth daily 2)  Alprazolam 0.25 Mg Tabs (Alprazolam) .... Take 1 tablet by mouth daily 3)  Diovan 80 Mg Tabs (Valsartan) .... Take 1 tablet by mouth once a day 4)  Furosemide 40 Mg Tabs (Furosemide) .Marland Kitchen.. 1 tablet one times a day 5)  Nitroquick 0.4 Mg Subl (Nitroglycerin) .... Place 1 tablet under tongue as directed 6)  Pletal 100 Mg Tabs (Cilostazol) .... Take 1 tablet by mouth twice a day 7)  Prozac 10 Mg Caps (Fluoxetine hcl) .... 3 capsule by mouth two times a day 8)  Temazepam 30 Mg Caps (Temazepam) 9)  Zetia 10 Mg Tabs (Ezetimibe) .... Take 1 tablet by mouth once a day 10)  Celebrex 200 Mg Caps (Celecoxib) .Marland Kitchen.. 1 two times a day after meals 11)  Nifedipine 30 Mg Tb24  (Nifedipine) .... One by mouth daily 12)  Glucotrol Xl 10 Mg Tb24 (Glipizide) .... Two times a day 13)  Simvastatin 80 Mg Tabs (Simvastatin) .... Take 1 at  bedtime. 14)  Plavix 75 Mg Tabs (Clopidogrel bisulfate) .... Once daily 15)  Toprol Xl 50 Mg Xr24h-tab (Metoprolol succinate) .... One tablet once time a day 16)  Tricor 145 Mg Tabs (Fenofibrate) .... Once daily 17)  Otc Potassium  .Marland Kitchen.. 1 tab qd 18)  Fish Oil Oil (Fish oil) .... 2 tabs qd 19)  Multivitamins Tabs (Multiple vitamin) .Marland Kitchen.. 1 tab once daily 20)  Glucosamine Sulfate Powd (Glucosamine sulfate) .... 2 tabs once daily 21)  Ketoconozole  .... Apply to affected area weekly for 2 weeks 22)  Metformin Hcl 500 Mg Tabs (Metformin hcl) .... 1/2 tab hs 23)  Hydromet 5-1.5 Mg/4ml Syrp (Hydrocodone-homatropine) .... Take 1-2 tsp as needed cough 24)  Lotrisone 1-0.05 % Crea (Clotrimazole-betamethasone) .... Apply b.i.d. for 14 days 25)  Flexeril 10 Mg Tabs (Cyclobenzaprine hcl) .Marland Kitchen.. 1 morn midafternoon and hs for muscle spasm  Patient Instructions: 1)  acute Ls strain with SI strain  2)  muscle spasm 3)  Flexeeril 10 mg three times a day 4)   for muscle spasm 5)  For edema legs take lasix when needed 6)  continue cellebrex 7)  take 1/2 tab metformin since it bothewrs you mentalyy 8)  return as needed 9)  Depo-Medrol 120 mg IM Prescriptions: FLEXERIL 10 MG TABS (CYCLOBENZAPRINE HCL) 1 morn midafternoon and hs for muscle spasm  #90 x 3   Entered and Authorized by:   Emeterio Reeve MD   Signed by:   Emeterio Reeve MD on 04/06/2010   Method used:   Electronically to        [facility] (retail)       [address] [place], [place]  [id number]       Ph: [phone]       Fax: [phone]   RxID:   [id number]    Medication Administration  Injection # 1:    Medication: Depo- Medrol 80mg     Diagnosis: LUMBOSACRAL STRAIN (ICD-846.0)    Route: IM    Site: RUOQ gluteus    Exp Date:  10/31/2012    Lot #: [id number]    Mfr: [employer]    Patient tolerated injection without complications    Given by: Deanna Artis CMA (April 06, 2010 2:40 PM)  Injection # 2:    Medication: Depo- Medrol 40mg     Diagnosis: LUMBOSACRAL STRAIN (ICD-846.0)    Route: IM    Site: RUOQ gluteus  Exp Date: 10/31/2012    Lot #: [id number]    Patient tolerated injection without complications    Given by: Deanna Artis CMA (April 06, 2010 2:41 PM)  Orders Added: 1)  Depo- Medrol 80mg  [J1040] 2)  Admin of Therapeutic Inj  intramuscular or subcutaneous [[id number]] 3)  Prescription Created Electronically [id number] 4)  Est. Patient Level III [id number]

## 2011-01-27 NOTE — Assessment & Plan Note (Signed)
Summary: rec col/bt's...as.   History of Present Illness Visit Type: new patient Primary GI MD: Erskine Emery MD [facility] Primary Provider: Lorenda Cahill  Chief Complaint: recall colon on plavix and pletal, pt denies any GI problems.  Pt wil be having cataract surgery next week History of Present Illness:   Andrew [name] is a pleasant 77 year old white male referred at the request of Dr. Joni Fears for recall colonoscopy.  In 2008 a 40mm  adenomatous polyp was removed from the ascending colon.  Diverticulosis also was seen.  He has no GI complaints including change in bowel habits, abdominal pain, melena or hematochezia.  Patient has coronary artery disease and peripheral vascular disease for which he is taking pletal,  aspirin, and Plavix  He is a non-insulin-dependent diabetic.   GI Review of Systems      Denies abdominal pain, acid reflux, belching, bloating, chest pain, dysphagia with liquids, dysphagia with solids, heartburn, loss of appetite, nausea, vomiting, vomiting blood, weight loss, and  weight gain.        Denies anal fissure, black tarry stools, change in bowel habit, constipation, diarrhea, diverticulosis, fecal incontinence, heme positive stool, hemorrhoids, irritable bowel syndrome, jaundice, light color stool, liver problems, rectal bleeding, and  rectal pain.    Current Medications (verified): 1)  Aspirin Ec 325 Mg Tbec (Aspirin) .... Take One Tablet By Mouth Daily 2)  Alprazolam 0.25 Mg Tabs (Alprazolam) .... Take 1 Tablet By Mouth Daily 3)  Diovan 80 Mg Tabs (Valsartan) .... Take 1 Tablet By Mouth Once A Day 4)  Furosemide 40 Mg Tabs (Furosemide) .Marland Kitchen.. 1 Tablet One Times A Day 5)  Nitroquick 0.4 Mg Subl (Nitroglycerin) .... Place 1 Tablet Under Tongue As Directed 6)  Pletal 100 Mg Tabs (Cilostazol) .... Take 1 Tablet By Mouth Twice A Day 7)  Prozac 10 Mg Caps (Fluoxetine Hcl) .... 3 Capsule By Mouth Two Times A Day 8)  Temazepam 30 Mg Caps (Temazepam) 9)  Zetia 10 Mg  Tabs (Ezetimibe) .... Take 1 Tablet By Mouth Once A Day 10)  Celebrex 200 Mg  Caps (Celecoxib) .Marland Kitchen.. 1 Two Times A Day After Meals 11)  Nifedipine 30 Mg  Tb24 (Nifedipine) .... One By Mouth Daily 12)  Glucotrol Xl 10 Mg  Tb24 (Glipizide) .... Two Times A Day 13)  Simvastatin 80 Mg  Tabs (Simvastatin) .... Take 1 At  Bedtime. 14)  Plavix 75 Mg Tabs (Clopidogrel Bisulfate) .... Once Daily 15)  Toprol Xl 50 Mg Xr24h-Tab (Metoprolol Succinate) .... One Tablet Once Time A Day 16)  Tricor 145 Mg Tabs (Fenofibrate) .... Once Daily 17)  Otc Potassium .Marland Kitchen.. 1 Tab Qd 18)  Fish Oil   Oil (Fish Oil) .... 2 Tabs Qd 19)  Multivitamins   Tabs (Multiple Vitamin) .Marland Kitchen.. 1 Tab Once Daily 20)  Glucosamine Sulfate   Powd (Glucosamine Sulfate) .... 2 Tabs Once Daily  Allergies (verified): No Known Drug Allergies  Past History:  Past Medical History: Reviewed history from 05/06/2010 and no changes required. CORONARY ARTERY DISEASE (ICD-414.00) LEFT VENTRICULAR FAILURE (ICD-428.1) CAROTID ARTERY STENOSIS, RIGHT (ICD-433.10) HYPERTENSION (ICD-401.9) HYPERLIPIDEMIA (ICD-272.4) COUMADIN THERAPY (ICD-V58.61) DVT, HX OF (ICD-V12.51) GERD (ICD-530.81) DIABETES MELLITUS, TYPE II, UNCONTROLLED (ICD-250.02) SPECIAL SCREENING MALIGNANT NEOPLASM OF PROSTATE (ICD-V76.44) HYPOTHYROIDISM (ICD-244.9) ANEMIA (ICD-285.9) VITAMIN D DEFICIENCY (ICD-268.9) UNS ADVRS EFF OTH RX MEDICINAL&BIOLOGICAL SBSTNC (ICD-995.29) ARTHRITIS (ICD-716.90) IRRITABLE BOWEL SYNDROME (ICD-564.1) ENCOUNTER FOR THERAPEUTIC DRUG MONITORING (ICD-V58.83) FREQUENCY, URINARY (ICD-788.41)  Adenomatous Polyp 2008 Diverticulosis  Past Surgical History: Reviewed history from 04/11/2009 and no changes  required. Abdominal aortic aneurysm repair Coronary artery bypass graft...1992 Total knee replacement  Family History: Family History of Coronary Artery Disease: Brother Family History of Diabetes: Brother and grandmother Neg for stroke Family  History of Colon Cancer: Father Brother has "abdominal cancer" unsure which organ ? 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  quit 1985 Alcohol Use - no Drug Use - no  Review of Systems  The patient denies allergy/sinus, anemia, anxiety-new, arthritis/joint pain, back pain, blood in urine, breast changes/lumps, confusion, cough, coughing up blood, depression-new, fainting, fatigue, fever, headaches-new, hearing problems, heart murmur, heart rhythm changes, itching, muscle pains/cramps, night sweats, nosebleeds, shortness of breath, skin rash, sleeping problems, sore throat, swelling of feet/legs, swollen lymph glands, thirst - excessive, urination - excessive, urination changes/pain, urine leakage, vision changes, and voice change.    Vital Signs:  Patient profile:   77 year old male Height:      67 inches Weight:      214 pounds BMI:     33.64 Pulse rate:   60 / minute Pulse rhythm:   regular BP sitting:   134 / 66  (left arm) Cuff size:   regular  Vitals Entered By: Abelino Derrick CMA Deborra Medina) (May 12, 2010 10:26 AM)  Physical Exam  Additional Exam:  Is well-developed well-nourished male  skin: anicteric HEENT: normocephalic; PEERLA; no nasal or pharyngeal abnormalities neck: supple nodes: no cervical lymphadenopathy chest: clear to ausculatation and percussion heart: no murmurs, gallops, or rubs abd: soft, nontender; BS normoactive; no abdominal masses, tenderness, organomegaly rectal: deferred ext: no cynanosis, clubbing, edema; chronic venous stasis changes are present in the lower extremities skeletal: no deformities neuro: oriented x 3; no focal abnormalities    Impression & Recommendations:  Problem # 1:  PERSONAL HISTORY OF COLONIC POLYPS (ICD-V12.72) 5 year followup is adequate in view of his prior colonoscopic findings.  Accordingly, colonoscopy will be deferred another  2 years.  Problem # 2:  CAROTID ARTERY STENOSIS, RIGHT (ICD-433.10) Assessment: Comment Only  Problem # 3:  DIABETES MELLITUS, TYPE II, UNCONTROLLED (ICD-250.02) Assessment: Comment Only  Patient Instructions: 1)  Copy sent to : Andrew Stafford,MD 2)  We will send you a letter when you are due for another colonoscopy. 3)  The medication list was reviewed and reconciled.  All changed / newly prescribed medications were explained.  A complete medication list was provided to the patient / caregiver.  Appended Document: rec col/bt's...as. reviewed

## 2011-01-27 NOTE — Assessment & Plan Note (Signed)
Summary: emp---will fast//ccm   Vital Signs:  Patient profile:   77 year old male Height:      67 inches Weight:      213 pounds BMI:     33.48 O2 Sat:      94 % Temp:     97.8 degrees F Pulse rate:   100 / minute Pulse rhythm:   regular BP sitting:   134 / 76  (left arm) Cuff size:   large  Vitals Entered By: Levora Angel, RN (March 18, 2010 8:50 AM) CC: REFILLS GO OVER PROBLEMS FASTING  Is Patient Diabetic? No   History of Present Illness: To 77 year old white male patient who has had CABG, diabetes, arthritis and has had a total knee replacement. He has had congestive heart failure and past but is on the good control at this time. Also has hypertension is under control today he is in no distress his problems as well as refill of all of his medications. He has a history of chronic depression and anxiety which had been controlled with alprazolam and Prozac. Would discuss his weight problem and he states he didn't attempting to lose weight but has been unsuccessful. He also relates his CBGs were 140s to 109 and sugar the diabetes is not under control. He has no cardiac symptoms at this time no dyspnea which he has had in the past Who was recently on a cruise and had no problems on the she'll poor eater at the airport Blood pressure control  Preventive Screening-Counseling & Management  Alcohol-Tobacco     Year Quit: 1981  Allergies (verified): No Known Drug Allergies  Past History:  Past Medical History: Last updated: 04/11/2009 CORONARY ARTERY DISEASE (ICD-414.00) LEFT VENTRICULAR FAILURE (ICD-428.1) CAROTID ARTERY STENOSIS, RIGHT (ICD-433.10) HYPERTENSION (ICD-401.9) HYPERLIPIDEMIA (ICD-272.4) COUMADIN THERAPY (ICD-V58.61) DVT, HX OF (ICD-V12.51) GERD (ICD-530.81) DIABETES MELLITUS, TYPE II, UNCONTROLLED (ICD-250.02) SPECIAL SCREENING MALIGNANT NEOPLASM OF PROSTATE (ICD-V76.44) HYPOTHYROIDISM (ICD-244.9) ANEMIA (ICD-285.9) VITAMIN D DEFICIENCY (ICD-268.9) UNS  ADVRS EFF OTH RX MEDICINAL&BIOLOGICAL SBSTNC (ICD-995.29) ARTHRITIS (ICD-716.90) IRRITABLE BOWEL SYNDROME (ICD-564.1) ENCOUNTER FOR THERAPEUTIC DRUG MONITORING (ICD-V58.83) FREQUENCY, URINARY [id number])    Past Surgical History: Last updated: 04/11/2009 Abdominal aortic aneurysm repair Coronary artery bypass graft...1992 Total knee replacement  Social History: Last updated: 04/11/2009 Full Time Married  Tobacco Use - Former.  quit 1985 Alcohol Use - no Drug Use - no  Risk Factors: Smoking Status: quit (04/11/2009)  Past History:  Care Management: Cardiology:  Dr Harold Hedge  Podiatry: [facility]  Gastroenterology: Dr Deatra Ina Ophthalmology: [facility].   Review of Systems  The patient denies anorexia, fever, weight loss, weight gain, vision loss, decreased hearing, hoarseness, chest pain, syncope, dyspnea on exertion, peripheral edema, prolonged cough, headaches, hemoptysis, abdominal pain, melena, hematochezia, severe indigestion/heartburn, hematuria, incontinence, genital sores, muscle weakness, suspicious skin lesions, transient blindness, difficulty walking, depression, unusual weight change, abnormal bleeding, enlarged lymph nodes, angioedema, breast masses, and testicular masses.    Physical Exam  General:  Well-developed,well-nourished,in no acute distress; alert,appropriate and cooperative throughout examinationoverweight-appearing.   Head:  Normocephalic and atraumatic without obvious abnormalities. No apparent alopecia or balding. Eyes:  No corneal or conjunctival inflammation noted. EOMI. Perrla. Funduscopic exam benign, without hemorrhages, exudates or papilledema. Vision grossly normal. Ears:  External ear exam shows no significant lesions or deformities.  Otoscopic examination reveals clear canals, tympanic membranes are intact bilaterally without bulging, retraction, inflammation or discharge. Hearing is grossly normal bilaterally. Nose:  External nasal  examination shows no deformity or inflammation.  Nasal mucosa are pink and moist without lesions or exudates. Mouth:  Oral mucosa and oropharynx without lesions or exudates.  Teeth in good repair. Neck:  No deformities, masses, or tenderness noted. Chest Wall:  CABG scar Breasts:  No masses or gynecomastia noted Lungs:  Normal respiratory effort, chest expands symmetrically. Lungs are clear to auscultation, no crackles or wheezes. Heart:  borderline cardiomegalynormal rate, regular rhythm, no murmur, no gallop, no rub, no JVD, and physiological split S2.   Abdomen:  obesenormal bowel sounds, no distention, no masses, no guarding, no rigidity, no rebound tenderness, no abdominal hernia, no inguinal hernia, no hepatomegaly, and no splenomegaly.   Rectal:  perianal fungal rash otherwise negative Genitalia:  Testes bilaterally descended without nodularity, tenderness or masses. No scrotal masses or lesions. No penis lesions or urethral discharge. Prostate:  1+ enlarged.   Msk:  right total knee replacement Pulses:  R and L carotid,radial,femoral,dorsalis pedis and posterior tibial pulses are full and equal bilaterally Extremities:  trace pretibial edema bilaterally Neurologic:  No cranial nerve deficits noted. Station and gait are normal. Plantar reflexes are down-going bilaterally. DTRs are symmetrical throughout. Sensory, motor and coordinative functions appear intact. Skin:  Intact without suspicious lesions or rashes Cervical Nodes:  No lymphadenopathy noted Axillary Nodes:  No palpable lymphadenopathy Inguinal Nodes:  No significant adenopathy Psych:  Cognition and judgment appear intact. Alert and cooperative with normal attention span and concentration. No apparent delusions, illusions, hallucinations   Impression & Recommendations:  Problem # 1:  TINEA CORPORIS (ICD-110.5) Assessment New Lotrisone two times a day form14 days  Problem # 2:  CAROTID ARTERY STENOSIS, RIGHT  (ICD-433.10) Assessment: Improved  His updated medication list for this problem includes:    Aspirin Ec 325 Mg Tbec (Aspirin) .Marland Kitchen... Take one tablet by mouth daily    Pletal 100 Mg Tabs (Cilostazol) .Marland Kitchen... Take 1 tablet by mouth twice a day    Plavix 75 Mg Tabs (Clopidogrel bisulfate) ..... Once daily  Problem # 3:  HYPERTENSION (ICD-401.9) Assessment: Improved  His updated medication list for this problem includes:    Diovan 80 Mg Tabs (Valsartan) .Marland Kitchen... Take 1 tablet by mouth once a day    Furosemide 40 Mg Tabs (Furosemide) .Marland Kitchen... 1 tablet one times a day    Nifedipine 30 Mg Tb24 (Nifedipine) ..... One by mouth daily    Toprol Xl 50 Mg Xr24h-tab (Metoprolol succinate) ..... One tablet once time a day  Orders: Prescription Created Electronically [id number])  Problem # 4:  HYPERLIPIDEMIA (ICD-272.4) Assessment: Improved  His updated medication list for this problem includes:    Zetia 10 Mg Tabs (Ezetimibe) .Marland Kitchen... Take 1 tablet by mouth once a day    Simvastatin 80 Mg Tabs (Simvastatin) .Marland Kitchen... Take 1 at  bedtime.    Tricor 145 Mg Tabs (Fenofibrate) ..... Once daily  Orders: TLB-Lipid Panel (80061-LIPID) TLB-Hepatic/Liver Function Pnl (80076-HEPATIC)  Problem # 5:  COUMADIN THERAPY (ICD-V58.61) Assessment: Unchanged  Problem # 6:  DVT, HX OF (ICD-V12.51) Assessment: Improved  His updated medication list for this problem includes:    Aspirin Ec 325 Mg Tbec (Aspirin) .Marland Kitchen... Take one tablet by mouth daily    Pletal 100 Mg Tabs (Cilostazol) .Marland Kitchen... Take 1 tablet by mouth twice a day    Plavix 75 Mg Tabs (Clopidogrel bisulfate) ..... Once daily  Problem # 7:  GERD (ICD-530.81) Assessment: Improved  Problem # 8:  DIABETES MELLITUS, TYPE II, UNCONTROLLED (ICD-250.02) Assessment: Deteriorated  His updated medication list for this problem includes:  Aspirin Ec 325 Mg Tbec (Aspirin) .Marland Kitchen... Take one tablet by mouth daily    Diovan 80 Mg Tabs (Valsartan) .Marland Kitchen... Take 1 tablet by mouth once a  day    Glucotrol Xl 10 Mg Tb24 (Glipizide) .Marland Kitchen..Marland Kitchen Two times a day    Metformin Hcl 500 Mg Tabs (Metformin hcl) .Marland Kitchen... 1 two times a day, am, pm for diabetews  Orders: Venipuncture [id number]) TLB-A1C / Hgb A1C (Glycohemoglobin) (83036-A1C) TLB-Microalbumin/Creat Ratio, Urine (82043-MALB) Prescription Created Electronically [phone])  Problem # 9:  ARTHRITIS (ICD-716.90) Assessment: Improved Celebrex 200 mg b.i.d.  Problem # 10:  FREQUENCY, URINARY (ICD-788.41) Assessment: Unchanged  Orders: UA Dipstick w/o Micro (automated)  (81003)  Problem # 11:  EXOGENOUS OBESITY (ICD-278.00) Assessment: Unchanged  Problem # 12:  ANXIETY DEPRESSION (ICD-300.4) Assessment: Unchanged Prozac 3 years and uses alprazolam p.r.n. for stress  Complete Medication List: 1)  Aspirin Ec 325 Mg Tbec (Aspirin) .... Take one tablet by mouth daily 2)  Alprazolam 0.25 Mg Tabs (Alprazolam) .... Take 1 tablet by mouth daily 3)  Diovan 80 Mg Tabs (Valsartan) .... Take 1 tablet by mouth once a day 4)  Furosemide 40 Mg Tabs (Furosemide) .Marland Kitchen.. 1 tablet one times a day 5)  Nitroquick 0.4 Mg Subl (Nitroglycerin) .... Place 1 tablet under tongue as directed 6)  Pletal 100 Mg Tabs (Cilostazol) .... Take 1 tablet by mouth twice a day 7)  Prozac 10 Mg Caps (Fluoxetine hcl) .... 3 capsule by mouth two times a day 8)  Temazepam 30 Mg Caps (Temazepam) 9)  Zetia 10 Mg Tabs (Ezetimibe) .... Take 1 tablet by mouth once a day 10)  Celebrex 200 Mg Caps (Celecoxib) .Marland Kitchen.. 1 two times a day after meals 11)  Nifedipine 30 Mg Tb24 (Nifedipine) .... One by mouth daily 12)  Glucotrol Xl 10 Mg Tb24 (Glipizide) .... Two times a day 13)  Simvastatin 80 Mg Tabs (Simvastatin) .... Take 1 at  bedtime. 14)  Plavix 75 Mg Tabs (Clopidogrel bisulfate) .... Once daily 15)  Toprol Xl 50 Mg Xr24h-tab (Metoprolol succinate) .... One tablet once time a day 16)  Tricor 145 Mg Tabs (Fenofibrate) .... Once daily 17)  Otc Potassium  .Marland Kitchen.. 1 tab qd 18)  Fish  Oil Oil (Fish oil) .... 2 tabs qd 19)  Multivitamins Tabs (Multiple vitamin) .Marland Kitchen.. 1 tab once daily 20)  Glucosamine Sulfate Powd (Glucosamine sulfate) .... 2 tabs once daily 21)  Ketoconozole  .... Apply to affected area weekly for 2 weeks 22)  Metformin Hcl 500 Mg Tabs (Metformin hcl) .Marland Kitchen.. 1 two times a day, am, pm for diabetews 23)  Hydromet 5-1.5 Mg/52ml Syrp (Hydrocodone-homatropine) .... Take 1-2 tsp as needed cough 24)  Lotrisone 1-0.05 % Crea (Clotrimazole-betamethasone) .... Apply b.i.d. for 14 days  Other Orders: T-Vitamin D (25-Hydroxy) [id number]) TLB-BMP (Basic Metabolic Panel-BMET) (99991111) TLB-CBC Platelet - w/Differential (85025-CBCD) TLB-TSH (Thyroid Stimulating Hormone) (84443-TSH) TLB-PSA (Prostate Specific Antigen) (84153-PSA)  Patient Instructions: 1)  you must work harder on weight reduction as well as better control of diabetes 2)  Increase her activities or exercise more 3)  Will refill medications 4)  We'll call results of lab Prescriptions: HYDROMET 5-1.5 MG/5ML SYRP (HYDROCODONE-HOMATROPINE) take 1-2 tsp as needed cough  #27ml x 1   Entered by:   Levora Angel, RN   Authorized by:   Emeterio Reeve MD   Signed by:   Emeterio Reeve MD on 03/26/2010   Method used:   Telephoned to [facility]  [facility] (retail)       [address] [place], [place]  [id number]       Ph: [phone]       Fax: [phone]   RxID:   [id number] METFORMIN HCL 500 MG TABS (METFORMIN HCL) 1 two times a day, AM, PM for diabetews  #60 x 11   Entered and Authorized by:   Emeterio Reeve MD   Signed by:   Emeterio Reeve MD on 03/23/2010   Method used:   Electronically to        [facility] (retail)       [address] [place], [place]  [id number]       Ph: [phone]       Fax: [phone]   RxID:   [id number] [place] apply to affected area weekly for 2 weeks  #30 gms x 1   Entered and  Authorized by:   Emeterio Reeve MD   Signed by:   Emeterio Reeve MD on 03/18/2010   Method used:   Print then Give to Patient   RxID:   [id number]

## 2011-01-27 NOTE — Letter (Signed)
Summary: Colonoscopy Letter  [facility]  [place], [place] [address]   Phone: [phone]  Fax: [phone]      March 16, 2010 MRN: [id number]   Andrew [name] [address] [place], [place]  [address]   Dear [name],   According to your medical record, it is time for you to schedule a Colonoscopy. The American Cancer Society recommends this procedure as a method to detect early colon cancer. Patients with a family history of colon cancer, or a personal history of colon polyps or inflammatory bowel disease are at increased risk.  This letter has beeen generated based on the recommendations made at the time of your procedure. If you feel that in your particular situation this may no longer apply, please contact our office.  Please call our office at [phone] to schedule this appointment or to update your records at your earliest convenience.  Thank you for cooperating with [place] to provide you with the very best care possible.   Sincerely,  Sandy Salaam. Deatra Ina, M.D.  [facility] [phone]

## 2011-01-27 NOTE — Procedures (Signed)
Summary: colonoscopy  [name]   Colonoscopy  Procedure date:  03/08/2007  Findings:      Results: Polyp.  Results: Hemorrhoids.     Results: Diverticulosis.       Location:  [facility].    Comments:      Repeat colonoscopy in 3 years.    Patient Name: Tevis, Kirin. MRN:  Procedure Procedures: Colonoscopy CPT: [phone].    with Hot Biopsy(s)CPT: [id number].  Personnel: Endoscopist: Clarene Reamer, MD.  Referred By: Tempie Hoist, MD.  Exam Location: Exam performed in Outpatient Clinic. Outpatient  Patient Consent: Procedure, Alternatives, Risks and Benefits discussed, consent obtained, from patient. Consent was obtained by the RN.  Indications  Surveillance of: Adenomatous Polyp(s).  Increased Risk Screening: For family history of colorectal neoplasia, in  parent  History  Current Medications: Patient is not currently taking Coumadin.  Pre-Exam Physical: Entire physical exam was normal.  Comments: Pt. history reviewed/updated, physical exam performed prior to initiation of sedation? Exam Exam: Extent of exam reached: Cecum, extent intended: Cecum.  The cecum was identified by appendiceal orifice and IC valve. Colon retroflexion performed. Images taken. ASA Classification: II. Tolerance: good.  Monitoring: Pulse and BP monitoring, Oximetry used. Supplemental O2 given.  Colon Prep Prep results: good.  Sedation Meds: Patient assessed and found to be appropriate for moderate (conscious) sedation. Fentanyl 50 mcg. given IV. Versed 5 mg. given IV.  Findings - DIVERTICULOSIS: Ascending Colon to Sigmoid Colon. ICD9: Diverticulosis, Colon: 562.10. Comments: mild-moderate.  POLYP: Ascending Colon, Maximum size: 4 mm. sessile polyp. Procedure:  hot biopsy, removed, retrieved, Polyp sent to pathology. ICD9: Colon Polyps: 211.3.  - HEMORRHOIDS: External. Size: Grade I. ICD9: Hemorrhoids, External: 455.3.   Assessment Abnormal examination, see  findings above.  Diagnoses: 562.10: Diverticulosis, Colon.  211.3: Colon Polyps.  455.3: Hemorrhoids, External.   Events  Unplanned Interventions: No intervention was required.  Unplanned Events: There were no complications. Plans Medication Plan: Await pathology. Continue current medications.  Patient Education: Patient given standard instructions for: Polyps. Diverticulosis. Hemorrhoids. Patient instructed to get routine colonoscopy every 3 years.  Disposition: After procedure patient sent to recovery. After recovery patient sent home.  This report was created from the original endoscopy report, which was reviewed and signed by the above listed endoscopist.    cc. Eligha Bridegroom

## 2011-01-27 NOTE — Medication Information (Signed)
Summary: Order for Diabetic Testing Supplies  Order for Diabetic Testing Supplies   Imported By: Laural Benes 01/11/2011 09:40:14  _____________________________________________________________________  External Attachment:    Type:   Image     Comment:   External Document

## 2011-01-27 NOTE — Medication Information (Signed)
Summary: Order for Diabetic Testing Supplies  Order for Diabetic Testing Supplies   Imported By: Laural Benes 01/13/2011 09:57:12  _____________________________________________________________________  External Attachment:    Type:   Image     Comment:   External Document

## 2011-01-27 NOTE — Assessment & Plan Note (Signed)
Summary: FLU SHOT/CJR   Nurse Visit   Review of Systems       Flu Vaccine Consent Questions     Do you have a history of severe allergic reactions to this vaccine? no    Any prior history of allergic reactions to egg and/or gelatin? no    Do you have a sensitivity to the preservative Thimersol? no    Do you have a past history of Guillan-Barre Syndrome? no    Do you currently have an acute febrile illness? no    Have you ever had a severe reaction to latex? no    Vaccine information given and explained to patient? yes    Are you currently pregnant? no    Lot Number:[id number]   Exp Date:06/25/2011   Site Given  Left Deltoid IM Gardenia Phlegm RMA  September 09, 2010 3:12 PM    Allergies: No Known Drug Allergies  Orders Added: 1)  Flu Vaccine 73yrs + MEDICARE PATIENTS [Q2039] 2)  Administration Flu vaccine - MCR [id number]

## 2011-01-27 NOTE — Miscellaneous (Signed)
Summary: [facility] Previsit/prep  Clinical Lists Changes     Pt. on Pletal and Plavix.  Ofiice visit scheduled.

## 2011-01-27 NOTE — Progress Notes (Signed)
Summary: pt rtn call  Medications Added LIPITOR 80 MG TABS (ATORVASTATIN CALCIUM) take 1 tablet at bedtime       Phone Note Call from Patient Call back at Home Phone [phone]   Caller: Patient Reason for Call: Talk to Nurse, Talk to Doctor Summary of Call: pt rtn call  Initial call taken by: Shelda Pal,  November 10, 2010 3:06 PM  Follow-up for Phone Call        Pt is aware of labs and medication change. Horton Chin RN    New/Updated Medications: LIPITOR 80 MG TABS (ATORVASTATIN CALCIUM) take 1 tablet at bedtime Prescriptions: LIPITOR 80 MG TABS (ATORVASTATIN CALCIUM) take 1 tablet at bedtime  #30 x 11   Entered by:   Joelyn Oms RN   Authorized by:   Renella Cunas, MD, [facility]   Signed by:   Joelyn Oms RN on 11/10/2010   Method used:   Electronically to        [facility] (retail)       [place], [place]  [id number]       Ph: [phone]       Fax: [phone]   RxID:   [id number]

## 2011-01-27 NOTE — Assessment & Plan Note (Signed)
Summary: CAD/ANAS      Allergies Added: NKDA  Primary Provider:  Emeterio Reeve MD   History of Present Illness: Andrew [name] returns today for evaluation and management of his coronary disease and carotid artery disease.  He is having no symptoms that he had prior to having a stent placed in his right coronary artery in December 2009. At that time he presented with exertional shortness of breath and some chest pain.  He denies any orthopnea, PND or peripheral edema. He's had noticed symptoms of strokes or mini strokes.  He's compliant with his medications. Laboratory data followed by Dr. Joni Fears of primary care  Clinical Reports Reviewed:  Cardiac Cath:  12/11/2008: Cardiac Cath Findings:  HEMODYNAMIC FINDINGS:  Central aortic pressure 168/88.  Left ventricular   pressure 179/35.  End-diastolic pressure 48.      IMPRESSION:   1. Successful percutaneous coronary intervention with placement of a       bare-metal stent in a severely tortuous, ectatic right coronary       artery.   2. Single-vessel coronary artery disease status post three-vessel       coronary bypass surgery with all 3 of the grafts leading to the       right coronary artery.  Only one of these grafts is patent       currently.      RECOMMENDATIONS:  The patient will have an echocardiogram to assess his   left ventricular function.  He is to be continued on aspirin and Plavix   for at least 6 weeks.  I would prefer that he remain on the Plavix for a   year if he tolerates this medication.  His end-diastolic filling   pressures are also elevated because of this, I think he would benefit   from some diuresis.               Lauree Chandler, MD   Electronically Signed      01/21/2004: Cardiac Cath Findings:   IMPRESSION/RECOMMENDATIONS:  Successful drug-eluding stent placement in the  severe stenosis of the posterior descending artery ostium.  Will plan on a  trial of medical therapy for the  significant posterior left ventricle  lesion.  Its small size would be accompanied by a high restenosis rate.  It  is technically amenable to percutaneous coronary intervention should his  symptoms warrant.  I will treat him with Plavix for 1 year.  Aspirin will be  continued indefinitely.                                             Ethelle Lyon, M.D.  Nuclear Study:  11/06/2007:  Excerise capacity: Adenosine study with no exercise  Blood Pressure response: Normal blood pressure response  Clinical symptoms:  ECG impression: Baseline: NSR;. No significant ST segement change with addenosine.  Overall impression: Abnormal stress nuclear study. Previous anterolateral infarct with mild to moderate peri-infarct ischemia. EF 37%.  Glori Bickers, MD      01/11/2005:  Final Interpretation: Abnormal Myoview with no diagnostic electrocardiographic changes. The scnitigraphic results showed a prior lateral infarct, and there is mild to moderate peri-infarct ischemia. The gated ejection fraction was 41%, and there was akinesis at the lateral Keyen Marban.  Biran S. Stanford Breed, MD, [facility]  Carotid Doppler:  02/10/2010:  Impressions:  Technically challenging study. Stable, mild cartoid artery disease,  bilaterally. 40-59% bilateral ICA stenosis.  Sherren Mocha, MD   Current Medications (verified): 1)  Aspirin Ec 325 Mg Tbec (Aspirin) .... Take One Tablet By Mouth Daily 2)  Alprazolam 0.25 Mg Tabs (Alprazolam) .... Take 1 Tablet By Mouth Three Times A Day 3)  Diovan 80 Mg Tabs (Valsartan) .... Take 1 Tablet By Mouth Once A Day 4)  Furosemide 40 Mg Tabs (Furosemide) .Marland Kitchen.. 1 Tablet One Times A Day 5)  Nitroquick 0.4 Mg Subl (Nitroglycerin) .... Place 1 Tablet Under Tongue As Directed 6)  Pletal 100 Mg Tabs (Cilostazol) .... Take 1 Tablet By Mouth Twice A Day 7)  Prozac 10 Mg Caps (Fluoxetine Hcl) .... 3 Capsule By Mouth Once A Day 8)  Temazepam 30 Mg Caps (Temazepam) 9)  Zetia 10 Mg Tabs  (Ezetimibe) .... Take 1 Tablet By Mouth Once A Day 10)  Celebrex 200 Mg  Caps (Celecoxib) .Marland Kitchen.. 1 Two Times A Day After Meals 11)  Nifedipine 30 Mg  Tb24 (Nifedipine) .... One By Mouth Daily 12)  Glucotrol Xl 10 Mg  Tb24 (Glipizide) .... Two Times A Day 13)  Simvastatin 80 Mg  Tabs (Simvastatin) .... Take 1 At  Bedtime. 14)  Plavix 75 Mg Tabs (Clopidogrel Bisulfate) .... Once Daily 15)  Toprol Xl 50 Mg Xr24h-Tab (Metoprolol Succinate) .... One Tablet Once Time A Day 16)  Tricor 145 Mg Tabs (Fenofibrate) .... Once Daily 17)  Otc Potassium .Marland Kitchen.. 1 Tab Qd 18)  Fish Oil   Oil (Fish Oil) .... 2 Tabs Qd 19)  Multivitamins   Tabs (Multiple Vitamin) .Marland Kitchen.. 1 Tab Once Daily 20)  Glucosamine Sulfate   Powd (Glucosamine Sulfate) .... 2 Tabs Once Daily  Allergies (verified): No Known Drug Allergies  Past History:  Past Medical History: Last updated: 04/11/2009 CORONARY ARTERY DISEASE (ICD-414.00) LEFT VENTRICULAR FAILURE (ICD-428.1) CAROTID ARTERY STENOSIS, RIGHT (ICD-433.10) HYPERTENSION (ICD-401.9) HYPERLIPIDEMIA (ICD-272.4) COUMADIN THERAPY (ICD-V58.61) DVT, HX OF (ICD-V12.51) GERD (ICD-530.81) DIABETES MELLITUS, TYPE II, UNCONTROLLED (ICD-250.02) SPECIAL SCREENING MALIGNANT NEOPLASM OF PROSTATE (ICD-V76.44) HYPOTHYROIDISM (ICD-244.9) ANEMIA (ICD-285.9) VITAMIN D DEFICIENCY (ICD-268.9) UNS ADVRS EFF OTH RX MEDICINAL&BIOLOGICAL SBSTNC (ICD-995.29) ARTHRITIS (ICD-716.90) IRRITABLE BOWEL SYNDROME (ICD-564.1) ENCOUNTER FOR THERAPEUTIC DRUG MONITORING (ICD-V58.83) FREQUENCY, URINARY [id number])    Past Surgical History: Last updated: 04/11/2009 Abdominal aortic aneurysm repair Coronary artery bypass graft...1992 Total knee replacement  Family History: Last updated: 04/11/2009 Family History of Coronary Artery Disease: Brother Family History of Diabetes: Brother and grandmother Neg for stroke  Social History: Last updated: 04/11/2009 Full Time Married  Tobacco Use - Former.   quit 1985 Alcohol Use - no Drug Use - no  Risk Factors: Smoking Status: quit (04/11/2009)  Review of Systems       negative history of present illness  Vital Signs:  Patient profile:   77 year old male Height:      67 inches Weight:      212 pounds Pulse rate:   102 / minute Pulse rhythm:   irregular BP sitting:   100 / 62  (left arm) Cuff size:   large  Vitals Entered By: Julaine Hua, CMA (February 23, 2010 2:36 PM)  Physical Exam  General:  obese.   Head:  normocephalic and atraumatic Eyes:  PERRLA/EOM intact; conjunctiva and lids normal. Mouth:  Teeth, gums and palate normal. Oral mucosa normal. Neck:  Neck supple, no JVD. No masses, thyromegaly or abnormal cervical nodes. Chest Elianis Fischbach:  no deformities or breast masses noted Lungs:  Clear bilaterally to auscultation and percussion. Heart:  Non-displaced  PMI, chest non-tender; regular rate and rhythm, S1, S2 without murmurs, rubs or gallops. Carotid upstroke normal, no bruit. Normal abdominal aortic size, no bruits. Femorals normal pulses, no bruits. Pedals normal pulses. No edema, no varicosities. Msk:  Back normal, normal gait. Muscle strength and tone normal. Pulses:  diminished in the lower extremities posterior tibials palpable Extremities:  trace left pedal edema and trace right pedal edema.   Neurologic:  Alert and oriented x 3. Skin:  Intact without lesions or rashes. Psych:  Normal affect.   Problems:  Medical Problems Added: 1)  Dx of Cad, Autologous Bypass Graft  (ICD-414.02)  Impression & Recommendations:  Problem # 1:  CAD, AUTOLOGOUS BYPASS GRAFT (ICD-414.02) Assessment Unchanged He is status post bare-metal stent to the right coronary artery over a year ago. He had typical symptoms prior to procedure. He is currently asymptomatic. Will forego any stress testing. Followup in 6 months.  His updated medication list for this problem includes:    Aspirin Ec 325 Mg Tbec (Aspirin) .Marland Kitchen... Take one tablet by  mouth daily    Nitroquick 0.4 Mg Subl (Nitroglycerin) .Marland Kitchen... Place 1 tablet under tongue as directed    Pletal 100 Mg Tabs (Cilostazol) .Marland Kitchen... Take 1 tablet by mouth twice a day    Nifedipine 30 Mg Tb24 (Nifedipine) ..... One by mouth daily    Plavix 75 Mg Tabs (Clopidogrel bisulfate) ..... Once daily    Toprol Xl 50 Mg Xr24h-tab (Metoprolol succinate) ..... One tablet once time a day  Problem # 2:  CAROTID ARTERY STENOSIS, RIGHT (ICD-433.10) Assessment: Unchanged  His updated medication list for this problem includes:    Aspirin Ec 325 Mg Tbec (Aspirin) .Marland Kitchen... Take one tablet by mouth daily    Pletal 100 Mg Tabs (Cilostazol) .Marland Kitchen... Take 1 tablet by mouth twice a day    Plavix 75 Mg Tabs (Clopidogrel bisulfate) ..... Once daily  Problem # 3:  HYPERTENSION (ICD-401.9) Assessment: Unchanged  His updated medication list for this problem includes:    Aspirin Ec 325 Mg Tbec (Aspirin) .Marland Kitchen... Take one tablet by mouth daily    Diovan 80 Mg Tabs (Valsartan) .Marland Kitchen... Take 1 tablet by mouth once a day    Furosemide 40 Mg Tabs (Furosemide) .Marland Kitchen... 1 tablet one times a day    Nifedipine 30 Mg Tb24 (Nifedipine) ..... One by mouth daily    Toprol Xl 50 Mg Xr24h-tab (Metoprolol succinate) ..... One tablet once time a day  His updated medication list for this problem includes:    Aspirin Ec 325 Mg Tbec (Aspirin) .Marland Kitchen... Take one tablet by mouth daily    Diovan 80 Mg Tabs (Valsartan) .Marland Kitchen... Take 1 tablet by mouth once a day    Furosemide 40 Mg Tabs (Furosemide) .Marland Kitchen... 1 tablet one times a day    Nifedipine 30 Mg Tb24 (Nifedipine) ..... One by mouth daily    Toprol Xl 50 Mg Xr24h-tab (Metoprolol succinate) ..... One tablet once time a day  Other Orders: EKG w/ Interpretation (93000)  Patient Instructions: 1)  Your physician recommends that you schedule a follow-up appointment in: [place] 2)  Your physician recommends that you continue on your current medications as directed. Please refer to the  Current Medication list given to you today.

## 2011-01-27 NOTE — Miscellaneous (Signed)
Summary: Orders Update  Clinical Lists Changes  Orders: Added new Test order of Carotid Duplex (Carotid Duplex) - Signed 

## 2011-01-31 ENCOUNTER — Other Ambulatory Visit: Payer: Self-pay | Admitting: Family Medicine

## 2011-01-31 DIAGNOSIS — I10 Essential (primary) hypertension: Secondary | ICD-10-CM

## 2011-02-03 ENCOUNTER — Other Ambulatory Visit: Payer: Self-pay | Admitting: *Deleted

## 2011-02-03 DIAGNOSIS — I6529 Occlusion and stenosis of unspecified carotid artery: Secondary | ICD-10-CM

## 2011-02-03 MED ORDER — CLOPIDOGREL BISULFATE 75 MG PO TABS: 75.0000 mg | ORAL_TABLET | Freq: Every day | ORAL | Status: DC

## 2011-02-11 ENCOUNTER — Encounter: Payer: Self-pay | Admitting: Cardiology

## 2011-02-16 NOTE — Miscellaneous (Signed)
Summary: Orders Update  Clinical Lists Changes  Orders: Added new Test order of Carotid Duplex (Carotid Duplex) - Signed 

## 2011-02-25 ENCOUNTER — Encounter: Payer: Self-pay | Admitting: Cardiology

## 2011-02-25 ENCOUNTER — Encounter (INDEPENDENT_AMBULATORY_CARE_PROVIDER_SITE_OTHER): Payer: Medicare Other

## 2011-02-25 DIAGNOSIS — I6529 Occlusion and stenosis of unspecified carotid artery: Secondary | ICD-10-CM

## 2011-03-17 ENCOUNTER — Other Ambulatory Visit: Payer: Self-pay | Admitting: Family Medicine

## 2011-03-22 ENCOUNTER — Encounter: Payer: Self-pay | Admitting: Family Medicine

## 2011-04-07 ENCOUNTER — Encounter: Payer: Self-pay | Admitting: Cardiology

## 2011-04-08 ENCOUNTER — Ambulatory Visit (INDEPENDENT_AMBULATORY_CARE_PROVIDER_SITE_OTHER): Payer: Medicare Other | Admitting: Cardiology

## 2011-04-08 ENCOUNTER — Telehealth: Payer: Self-pay | Admitting: Cardiology

## 2011-04-08 ENCOUNTER — Encounter: Payer: Self-pay | Admitting: Cardiology

## 2011-04-08 DIAGNOSIS — I2510 Atherosclerotic heart disease of native coronary artery without angina pectoris: Secondary | ICD-10-CM

## 2011-04-08 DIAGNOSIS — I6529 Occlusion and stenosis of unspecified carotid artery: Secondary | ICD-10-CM

## 2011-04-08 DIAGNOSIS — I10 Essential (primary) hypertension: Secondary | ICD-10-CM

## 2011-04-08 DIAGNOSIS — R000 Tachycardia, unspecified: Secondary | ICD-10-CM

## 2011-04-08 MED ORDER — METOPROLOL TARTRATE 50 MG PO TABS: ORAL_TABLET | ORAL | Status: DC

## 2011-04-08 NOTE — Telephone Encounter (Signed)
Metoprolol prescription called in to [facility]. Pt wife is aware. Horton Chin RN

## 2011-04-08 NOTE — Assessment & Plan Note (Signed)
Will stop nifedipine and increase his metoprolol to 75mg  bid.

## 2011-04-08 NOTE — Assessment & Plan Note (Signed)
stable °

## 2011-04-08 NOTE — Telephone Encounter (Signed)
Pt calling re metoprolol. Pt states its needs to be called [facility] to [facility] [phone]

## 2011-04-08 NOTE — Patient Instructions (Signed)
Your physician recommends that you schedule a follow-up appointment in:  02/2012 with Dr. Verl Blalock Your physician has recommended you make the following change in your medication:  Stop Nifedical Increase: Lopressor to 75mg  twice a day

## 2011-04-08 NOTE — Progress Notes (Signed)
   Patient ID: Andrew [name], male    DOB: 1928/11/29, 77 y.o.   MRN: [id number]  HPI  Andrew [name] comes in today for E and M of his CAD and CVD. He is very active in his yard and garden without angina or chest pain. Recent carotids are stable in March. Dr Joni Fears is checking his blood work. BP has been good. He thinks he took his metoprolol this a but will check back with [place].  EKG shows Sinus tachycardia with LAFB.    Review of Systems  All other systems reviewed and are negative.      Physical Exam  Constitutional: He is oriented to person, place, and time. He appears well-developed. No distress.       obese  HENT:  Head: Normocephalic and atraumatic.  Eyes: EOM are normal. Pupils are equal, round, and reactive to light.  Neck: Neck supple. No JVD present. No tracheal deviation present. No thyromegaly present.       Bilateral bruits  Cardiovascular: Normal rate, regular rhythm, normal heart sounds and intact distal pulses.  Exam reveals no gallop.   No murmur heard. Pulmonary/Chest: Effort normal and breath sounds normal.  Abdominal: Soft. Bowel sounds are normal.  Musculoskeletal: Normal range of motion. He exhibits edema.  Neurological: He is alert and oriented to person, place, and time.  Skin: Skin is warm and dry.  Psychiatric: He has a normal mood and affect.

## 2011-04-08 NOTE — Assessment & Plan Note (Signed)
Good, note med changes above

## 2011-04-08 NOTE — Assessment & Plan Note (Signed)
Stable, dopplers in March 2013.

## 2011-04-09 ENCOUNTER — Other Ambulatory Visit: Payer: Self-pay | Admitting: *Deleted

## 2011-04-09 DIAGNOSIS — I2510 Atherosclerotic heart disease of native coronary artery without angina pectoris: Secondary | ICD-10-CM

## 2011-04-09 MED ORDER — METOPROLOL TARTRATE 50 MG PO TABS: ORAL_TABLET | ORAL | Status: DC

## 2011-04-11 ENCOUNTER — Telehealth: Payer: Self-pay | Admitting: Cardiology

## 2011-04-11 NOTE — Telephone Encounter (Signed)
Pt calling re reaction to metoprolol-wants new med [facility]

## 2011-04-11 NOTE — Telephone Encounter (Signed)
This message should have gone to Dr. Yolanda Bonine nurse instead of the refill desk top pt wants to switch back to nifedipine last visit the directions for this pt was to stop nifedipine and increase his metoprolol to 75mg  bid.I spoke with pt he states he has had a reaction to metoprolol which was anxiety also increased heart rate pt has stopped taken metoprolol and begon  nifedipine once a day he wants to know if Dr. Verl Blalock agrees with this or if he should increase to BID

## 2011-04-12 ENCOUNTER — Ambulatory Visit (INDEPENDENT_AMBULATORY_CARE_PROVIDER_SITE_OTHER): Payer: Medicare Other | Admitting: Family Medicine

## 2011-04-12 ENCOUNTER — Encounter: Payer: Self-pay | Admitting: Family Medicine

## 2011-04-12 DIAGNOSIS — E119 Type 2 diabetes mellitus without complications: Secondary | ICD-10-CM

## 2011-04-12 DIAGNOSIS — E785 Hyperlipidemia, unspecified: Secondary | ICD-10-CM

## 2011-04-12 DIAGNOSIS — E559 Vitamin D deficiency, unspecified: Secondary | ICD-10-CM

## 2011-04-12 DIAGNOSIS — D649 Anemia, unspecified: Secondary | ICD-10-CM

## 2011-04-12 DIAGNOSIS — I10 Essential (primary) hypertension: Secondary | ICD-10-CM

## 2011-04-12 DIAGNOSIS — N138 Other obstructive and reflux uropathy: Secondary | ICD-10-CM

## 2011-04-12 DIAGNOSIS — I6529 Occlusion and stenosis of unspecified carotid artery: Secondary | ICD-10-CM

## 2011-04-12 DIAGNOSIS — N401 Enlarged prostate with lower urinary tract symptoms: Secondary | ICD-10-CM

## 2011-04-12 DIAGNOSIS — E039 Hypothyroidism, unspecified: Secondary | ICD-10-CM

## 2011-04-12 LAB — POCT URINALYSIS DIPSTICK
Bilirubin, UA: NEGATIVE
Ketones, UA: NEGATIVE
Leukocytes, UA: NEGATIVE
Nitrite, UA: NEGATIVE

## 2011-04-12 LAB — CBC WITH DIFFERENTIAL/PLATELET
Basophils Absolute: 0 10*3/uL (ref 0.0–0.1)
HCT: 37.6 % — ABNORMAL LOW (ref 39.0–52.0)
Lymphs Abs: 1.2 10*3/uL (ref 0.7–4.0)
MCHC: 34.8 g/dL (ref 30.0–36.0)
MCV: 95.9 fl (ref 78.0–100.0)
Monocytes Absolute: 0.5 10*3/uL (ref 0.1–1.0)
Neutro Abs: 4.4 10*3/uL (ref 1.4–7.7)
Platelets: 169 10*3/uL (ref 150.0–400.0)
RDW: 12.7 % (ref 11.5–14.6)

## 2011-04-12 LAB — HEPATIC FUNCTION PANEL
ALT: 58 U/L — ABNORMAL HIGH (ref 0–53)
AST: 43 U/L — ABNORMAL HIGH (ref 0–37)
Alkaline Phosphatase: 38 U/L — ABNORMAL LOW (ref 39–117)
Bilirubin, Direct: 0.1 mg/dL (ref 0.0–0.3)
Total Bilirubin: 0.6 mg/dL (ref 0.3–1.2)

## 2011-04-12 LAB — TSH: TSH: 2.51 u[IU]/mL (ref 0.35–5.50)

## 2011-04-12 LAB — BASIC METABOLIC PANEL
Calcium: 9.2 mg/dL (ref 8.4–10.5)
Chloride: 104 mEq/L (ref 96–112)
Creatinine, Ser: 1.3 mg/dL (ref 0.4–1.5)

## 2011-04-12 LAB — PSA: PSA: 2.51 ng/mL (ref 0.10–4.00)

## 2011-04-12 LAB — LIPID PANEL: Cholesterol: 127 mg/dL (ref 0–200)

## 2011-04-12 MED ORDER — CILOSTAZOL 100 MG PO TABS: 100.0000 mg | ORAL_TABLET | Freq: Two times a day (BID) | ORAL | Status: DC

## 2011-04-12 MED ORDER — CLOPIDOGREL BISULFATE 75 MG PO TABS: 75.0000 mg | ORAL_TABLET | Freq: Every day | ORAL | Status: DC

## 2011-04-12 MED ORDER — FLUOXETINE HCL 10 MG PO CAPS: 10.0000 mg | ORAL_CAPSULE | Freq: Every day | ORAL | Status: DC

## 2011-04-12 MED ORDER — NIFEDIPINE 20 MG PO CAPS: 20.0000 mg | ORAL_CAPSULE | Freq: Every day | ORAL | Status: DC

## 2011-04-12 MED ORDER — VALSARTAN 80 MG PO TABS: 80.0000 mg | ORAL_TABLET | Freq: Every day | ORAL | Status: DC

## 2011-04-12 MED ORDER — ATORVASTATIN CALCIUM 80 MG PO TABS: 80.0000 mg | ORAL_TABLET | Freq: Every day | ORAL | Status: DC

## 2011-04-12 MED ORDER — GLIPIZIDE ER 10 MG PO TB24: 10.0000 mg | ORAL_TABLET | Freq: Two times a day (BID) | ORAL | Status: DC

## 2011-04-12 MED ORDER — CELECOXIB 200 MG PO CAPS: 200.0000 mg | ORAL_CAPSULE | Freq: Two times a day (BID) | ORAL | Status: DC

## 2011-04-12 MED ORDER — EZETIMIBE 10 MG PO TABS: 10.0000 mg | ORAL_TABLET | Freq: Every day | ORAL | Status: DC

## 2011-04-12 MED ORDER — FENOFIBRATE 145 MG PO TABS: 145.0000 mg | ORAL_TABLET | Freq: Every day | ORAL | Status: DC

## 2011-04-12 MED ORDER — METFORMIN HCL 500 MG PO TABS: 500.0000 mg | ORAL_TABLET | Freq: Every day | ORAL | Status: DC

## 2011-04-12 NOTE — Telephone Encounter (Signed)
Pt is not taking metoprolol at this time due to his reaction as stated below.  He is on Nifedipine 30mg  once a day. Horton Chin RN

## 2011-04-13 LAB — VITAMIN D 25 HYDROXY (VIT D DEFICIENCY, FRACTURES): Vit D, 25-Hydroxy: 43 ng/mL (ref 30–89)

## 2011-04-14 ENCOUNTER — Other Ambulatory Visit: Payer: Self-pay

## 2011-04-14 ENCOUNTER — Telehealth: Payer: Self-pay

## 2011-04-14 MED ORDER — TAMSULOSIN HCL 0.4 MG PO CAPS: 0.4000 mg | ORAL_CAPSULE | Freq: Every day | ORAL | Status: DC

## 2011-04-14 NOTE — Telephone Encounter (Signed)
rx faxed to [facility]

## 2011-04-14 NOTE — Telephone Encounter (Signed)
Message from the pharmacist about two medications:  Procardia xl 30 mg on 02/01/11 but the most current rx was for procardia 20 mg qd that is not recommended for patients over 75 years old. There are interactions between the two medications.  Please advise or call the pharmacist back at [phone] option 2; reference number [id number]

## 2011-04-15 ENCOUNTER — Encounter: Payer: Self-pay | Admitting: Family Medicine

## 2011-04-15 NOTE — Telephone Encounter (Signed)
Discontinue procardia 20 mg per Dr. Joni Fears; will continue use of procardia xl 30 mg

## 2011-04-15 NOTE — Progress Notes (Signed)
  Subjective:    Patient ID: Andrew [name], male    DOB: 01-31-1928, 77 y.o.   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 He has had sinus obesity but has overt difficult time losing any weight Complaint of nocturia x6 Has had chronic anxiety and pressure in his dental medications for years. He is also a diabetic and controls as far well CBGs ranging from 90 to 1:30 Hypertension have been well controlled, but has not had as much problem with Exertionaldyspnea As a year ago       Review of Systemssee history of present illness     Objective:   Physical Exam this white male patient  Obese very cheerful and in no distress vital signs are good HEENT normal carotid pulses are good thyroid lump out will Chest examination reveals CABG scar Lungs clear to palpation percussion auscultation no rales no wheezing no dullness Abdomen obese Genitalia normal, rectal prostate 1+ enlarged Extremities trace peripheral edema Skin well tanned no nevi Neurological no positive         Assessment & Plan:  RRR disease and perirectal wall and to continue present medication BPH with urinary frequency and not. We'll start Flomax Arthritis generalized to increase Celebrex to one b.i.d. Exogenous obesity might effort to lose weight Anxiety depression continue his present medications Hypertension under control continue same treatment GERD and irritable bowel syndrome both doing well no change in treatment peripheral edema has improved continue furosemide 4 mg q.d. Diabetes mellitus attempt to diet better increase exercise

## 2011-04-15 NOTE — Patient Instructions (Signed)
In general I think your doing her well  Continue medications as prescribed We'll call results of lab studies Will start Flomax in an effort to improve nocturia Refill medications

## 2011-04-18 ENCOUNTER — Other Ambulatory Visit: Payer: Self-pay | Admitting: Family Medicine

## 2011-04-19 ENCOUNTER — Telehealth: Payer: Self-pay

## 2011-04-19 ENCOUNTER — Other Ambulatory Visit: Payer: Self-pay | Admitting: Family Medicine

## 2011-04-19 MED ORDER — METFORMIN HCL 500 MG PO TABS: ORAL_TABLET | ORAL | Status: DC

## 2011-04-19 NOTE — Telephone Encounter (Signed)
Faxing back a form to [facility] For certificate of medical necessity detailed written physician order/prior authorization request for home blood glucose testing and a form was faxed to to [facility] For to clarify for Procardia 30 mg and Fluoxetine 10 mg

## 2011-04-20 ENCOUNTER — Telehealth: Payer: Self-pay | Admitting: *Deleted

## 2011-04-20 NOTE — Telephone Encounter (Signed)
Dr Joni Fears called in cialis for this pt last night, but didn't give a strength- please ask dr Joni Fears and call [facility]

## 2011-04-20 NOTE — Telephone Encounter (Signed)
Pharmacy called Dr Joni Fears and he has already taken care of this

## 2011-04-29 ENCOUNTER — Other Ambulatory Visit: Payer: Self-pay

## 2011-04-29 MED ORDER — METFORMIN HCL 1000 MG PO TABS: 1000.0000 mg | ORAL_TABLET | Freq: Two times a day (BID) | ORAL | Status: DC

## 2011-04-29 NOTE — Telephone Encounter (Signed)
Fax sent to [facility] for metformin hcl 1000mg  #60 with 11 refills ok per Dr. Joni Fears

## 2011-05-10 NOTE — Assessment & Plan Note (Signed)
[facility]                            CARDIOLOGY OFFICE NOTE   NAME:Mcadam, Andrew [name]                     MRN:          [id number]  DATE:05/22/2008                            DOB:          10-10-1928    Andrew [name] comes in today for further management of his coronary  disease.  He has no complaints of angina or ischemia.  He has planted  his garden including his roses and he says it is just immaculate and he  is so excited!   He continues to eat without restriction and he is a full blown diabetic.  Last time he was thirsty and sure enough he tested positive for diabetes  with a hemoglobin A1c of 9.5%.  Dr. Joni Fears has started him on  glipizide.   He denies orthopnea, PND or peripheral edema.  He does have some dyspnea  on exertion.  His last stress Myoview was November 06, 2007:  EF 37%, LV  was dilated, anterior lateral hypokinesia, previous anterior lateral  wall infarct with mild peri-infarct ischemia.  This was felt to be  stable and he has been treated medically.   He is also seeing Dr. Drucie Opitz who feels like he has bilateral  superficial femoral artery stenosis, but is treating medically.   He has hypertension, obesity, mixed hyperlipidemia, ACE intolerance with  cough, but is on Diovan and tolerating.  Has a history of DVT and we  stopped this last time.   Current medications are:  1. Zetia 10 mg a day.  2. Aspirin 81 mg a day.  3. Multivitamin.  4. Nifedipine 30 mg a day.  5. Toprol XL 50 mg a day.  6. Diovan 80 mg a day.  7. Celebrex 400 mg  aday.  8. Alprazolam 0.25 p.o. daily  9. Glipizide 20 mg a day.  10.Cilostazol 200 mg a day.  11.Lasix40mg  a day.  12.Tricor 134 mg a day.  13.Lipitor 80 mg a day.   Blood pressure today is 103/58, his heart rate was 106.  EKG confirms  sinus tach with no acute EKG changes.  His weight is 217 which is  actually up 4.   HEENT is unchanged.  NECK: Carotids upstrokes are equal  bilaterally without bruits, no JVD.  Thyroid is not enlarged.  Trachea is midline.  LUNGS:  Clear.  HEART:  Reveals a poorly appreciated PMI.  He has no S3 gallop.  ABDOMINAL EXAM:  Protuberant with good bowel sounds.  There was no  tenderness.  Organomegaly could not be assessed.  EXTREMITIES:  Reveals no significant edema.  Pulses are intact.  NEURO:  Exam is intact.   ASSESSMENT/PLAN:  Andrew [name]' heart rate is up and I suspect some of  this is probably from dehydration.  He drinks a lot of caffeine and does  not like water.  His blood sugars are obviously out of control.   PLAN:  1. Increase hydration with free water.  2. Blood sugar control.  3. Increase Toprol XL to 50 mg p.o. b.i.d.   If his blood pressure becomes an issue, I would discontinue  his  nifedipine.   I will plan on seeing him back again in November.     Andrew C. Verl Blalock, MD, [facility]  Electronically Signed    TCW/MedQ  DD: 05/22/2008  DT: 05/22/2008  Job #: [id number]   cc:   Andrew Cisco., MD

## 2011-05-10 NOTE — Discharge Summary (Signed)
NAME:  Andrew [name], Andrew [name] NO.:  [id number]   MEDICAL RECORD NO.:  [id number]          PATIENT TYPE:  INP   LOCATION:  [id number]                         FACILITY:  [facility]   PHYSICIAN:  Marijo Conception. Wall, MD, FACCDATE OF BIRTH:  10-20-1928   DATE OF ADMISSION:  12/10/2008  DATE OF DISCHARGE:  12/12/2008                               DISCHARGE SUMMARY   PROCEDURES:  1. 2-D echocardiogram.  2. Cardiac catheterization.  3. Coronary arteriogram.  4. Saphenous vein graft angiogram.   PRIMARY/FINAL DISCHARGE DIAGNOSIS:  Unstable anginal pain, status post  percutaneous transluminal coronary angioplasty and bare-metal stent to  mid right coronary artery.   SECONDARY DIAGNOSES:  1. Status post aortocoronary bypass surgery in 1992 with right      internal mammary artery to posterior descending artery and      saphenous vein graft to posterolateral branches 1 and 2, right      internal mammary artery known to be occluded and saphenous vein      graft to posterolateral branch 1, only patent graft at this time.  2. Mild left ventricular dysfunction with an ejection fraction of 53%      at cath in 2005, echo pending.  3. Acute right lower extremity deep vein thrombosis in 2006.  4. Status post abdominal aortic aneurysm resection.  5. Diabetes.  6. Hypertension.  7. Hyperlipidemia.  8. Allergy or intolerance to PRINIVIL.  9. Peripheral vascular disease.  10.Status post bilateral knee replacements.  11.Family history of coronary artery disease in his brother.   TIME AT DISCHARGE:  36 minutes.   HOSPITAL COURSE:  Andrew [name] is a 77 year old male with known coronary  artery disease.  He was having increasing chest pain and symptoms  consistent with crescendo angina.  He came to the hospital and was  admitted for further evaluation.   He had a crescendo in his cardiac enzymes with the last set of enzymes  showing a CK-MB of 131/4.9, index 3.7, and troponin I of 0.2.  It was  decided that cardiac catheterization was indicated, and he went to the  cath lab on December 11, 2008.   The cardiac catheterization showed a left main that arises from the  right coronary cusp and atrial branch having an 80% stenosis  (unchanged).  Circumflex and LAD are small systems, and there was no  significant obstructive disease in either vessel.  The RCA was a  superdominant vessel and there was a 99% stenosis in the mid RCA between  2 large aneurysmal segments.  The stented segment of the PDA was patent.  There was no distal obstructive disease.  The SVG to PDA branch was  patent in the proximal limb, but the distal limb was occluded and the  RIMA was known to be occluded.  He had PTCA and a bare-metal stent  reducing the stenosis to 0.  He tolerated the procedure well.   On December 11, 2008, an echocardiogram is pending to assess his left  ventricular function.  Andrew [name] was seen by Cardiac Rehab and  ambulated without chest pain or shortness of breath.  He was evaluated  by Dr. Verl Blalock, who considered him stable for discharge with outpatient  followup arranged.   DISCHARGE INSTRUCTIONS:  1. His activity level is to be increased gradually with no driving for      2 days and no lifting for 2 weeks.  2. He is to call our office for problems with the cath site.  3. He is encouraged to stick to a low-fat diabetic diet.  4. He is to follow up with Dr. Verl Blalock on Friday, January 30, 2009 at      1:45.  He is to follow up with Dr. Joni Fears as needed.   DISCHARGE MEDICATIONS:  1. Zetia 10 mg daily.  2. Aspirin 325 mg daily.  3. Metoprolol 50 mg b.i.d.  4. Diovan 80 mg daily.  5. Celebrex 400 mg a day.  6. Alprazolam 0.25 mg as prior to admission.  7. Glipizide 20 mg b.i.d.  8. Cilostazol 200 mg daily.  9. Lasix 40 mg b.i.d.  10.TriCor 145 mg daily.  11.Simvastatin 80 mg a day.  12.Nifedipine 30 mg a day.  13.Potassium OTC daily.  14.Multivitamin daily.  15.Nitroglycerin  sublingual p.r.n.      Rosaria Ferries, PA-C      [name], MD, [name]  Electronically Signed    RB/MEDQ  D:  12/12/2008  T:  12/12/2008  Job:  [id number]   cc:   Shanna Cisco., MD

## 2011-05-10 NOTE — Assessment & Plan Note (Signed)
OFFICE VISIT   Andrew [name], Andrew [name]  DOB:  09-17-1928                                       05/15/2008  [id number]   The patient is a 77 year old gentleman with a history of abdominal  aortic aneurysm repair and left renal artery bypass carried out in 1997.  He underwent coronary artery bypass in 1992.   He has mild symptoms of right lower extremity claudication.  This is in  the right calf.  It occurs after two to three blocks.  With slow-paced  walking he can walk further than this.  Denies rest pain or night pain.  He is able to carry on his general activities of daily living without  significant disability.   PAST HISTORY:  Includes hypertension and coronary artery disease.   Lower extremity Doppler evaluation at this time reveals ankle brachial  indices 0.60 on the right, 0.78 on the left.  He has monophasic tibial  wave forms bilaterally.   On evaluation today, the patient is alert and oriented.  No acute  distress.  BP 103/59, pulse 93 per minute.  Lower extremity exam reveals  intact femoral pulses bilaterally.  No popliteal, posterior tibial,  dorsalis pedis pulses.  Feet well-perfused.  No ankle edema.   The patient's findings are most consistent with bilateral superficial  femoral artery occlusive disease.  No acute deterioration in symptoms.  Fairly stable claudication.  I have offered the option of potential  arteriography, he does not seem very interested in this at present.  Therefore, I will plan on following up with him again in 1 year with  lower extremity Dopplers.   Dorothea Glassman, M.D.  Electronically Signed   PGH/MEDQ  D:  05/15/2008  T:  05/16/2008  Job:  [id number]   cc:   Thomas C. Wall, MD, [facility]

## 2011-05-10 NOTE — Assessment & Plan Note (Signed)
[facility] OFFICE NOTE   LEAL, PASCALL                     MRN:          [id number]  DATE:02/26/2009                            DOB:          [birth year]    Ms. Lakeman comes in today after being admitted with unstable angina on  December 10, 2008.   His catheterization showed a large superdominant right coronary artery  that wrapped the apex of the heart and provided blood supply to at least  the distal third of the anterior wall and apex entirely.  He had a 99%  lesion in this vessel with 2 large aneurysmal segments.  A previous  stent the PDA was patent.  He had a sequential vein graft to a posterior  lateral segment with 1 arm of this graft being patent, the other  occluded.  This was known.  His LAD was a small vessel with no  significant disease.  The circumflex was relatively small vessel as well  with no significant disease.   He had a bare-metal stent placed to the ectatic, tortuous large right  coronary artery with success.   Since then, he has had no further chest discomfort and shortness of  breath has improved.   His other problems are listed on previous office notes as well as his  discharge summary.  They are extensive.   He had carotid Dopplers in February which showed nonobstructive plaque  and stable anatomy.  He is having no symptoms of TIAs.   His medications, please refer to the discharge summary in [place].   PHYSICAL EXAMINATION:  GENERAL:  His exam today, he is in no acute  distress.  He is very pleasant and funny as always.  VITAL SIGNS:  Blood pressure is excellent 106/57, his pulse is 90 and  regular, his weight is 219.  HEENT:  Normal.  Carotid upstrokes were equal bilaterally with a soft  right carotid bruit.  Thyroid is not enlarged.  Trachea is midline.  LUNGS:  Clear to auscultation and percussion.  HEART:  Nondisplaced PMI.  Normal S1 and S2.  No gallop or rub.  ABDOMEN:  Slightly protuberant.  Good bowel sounds.  No midline bruit.  EXTREMITIES:  No cyanosis, clubbing, or edema.  Pulses were reduced.  Capillary reflex is intact.  He has venous varicosities.  No sign of  DVT.  NEURO:  Intact.  SKIN:  Shows a few ecchymoses, otherwise negative.   ASSESSMENT/PLAN:  Ms. Demont his symptoms of angina and ischemia have  resolved.  I suspect the stent to the large ectatic right coronary  artery is open.  I have encouraged him to  continue with risk factor modification, continue the same medications,  and to see [place] back in June.  At that time, we will perform a perfusion  scan.     Thomas C. Verl Blalock, MD, [facility]  Electronically Signed    TCW/MedQ  DD: 02/26/2009  DT: 02/26/2009  Job #: [id number]

## 2011-05-10 NOTE — Consult Note (Signed)
NEW PATIENT CONSULTATION   Andrew [name], GROSECLOSE A  DOB:  [birth year]                                       08/31/2010  [id number]   The patient is a 77 year old male patient having previously been managed  by Dr. Drucie Opitz.  He had abdominal aortic aneurysm resection left  renal artery bypass performed in 1997 and has been followed for  claudication symptoms since that time which have been fairly stable.  His right leg is worse than the left but both calves were becoming tight  after walking about 2 blocks.  He is unable to walk much further and  rests briefly and then is able to proceed.  He has no history of rest  pain, nonhealing ulcers, infection, gangrene or other problems.   CHRONIC MEDICAL PROBLEMS:  1. Coronary artery disease status post coronary artery bypass grafting      in '92 followed by Dr. Verl Blalock.  2. Type 2 diabetes mellitus.  3. Hypertension.  4. Hyperlipidemia.  5. Chronic umbilical hernia.  6. Negative for COPD or stroke.   SOCIAL HISTORY:  He is married, has two children and is retired.  Has  not smoked in 25 years.  Does not use alcohol.   FAMILY HISTORY:  Positive for coronary artery disease in one brother,  diabetes in a grandmother.  Negative for stroke.   REVIEW OF SYSTEMS:  Positive for decreasing visual acuity, history of  DVT in the right leg a few years ago and briefly took Coumadin.  Has had  no further problems.  Does have arthritis and previous knee replacements  bilaterally, dyspnea on exertion which is mild and urinary frequency.  All other systems are negative.   PHYSICAL EXAMINATION:  VITAL SIGNS:  Blood pressure 107/62, heart rate  100, respirations 14, temperature 98.  General:  He is a well-developed,  well-nourished male in no apparent distress, alert and oriented x3.  HEENT:  Exam normal for age.  EOMs intact.  Lungs:  Clear to  auscultation.  No rhonchi or wheezes.  Cardiovascular:  Regular rhythm  with no  murmurs.  Carotid pulses 3+, no bruits.  Abdomen:  Is obese.  No  palpable masses.  He does have an easily reducible umbilical hernia.  Musculoskeletal:  Free of major deformities.  Neurological:  Normal.  Skin:  Free of rashes.  Lower extremity:  Exam reveals 3+ femoral and  popliteal pulses bilaterally with no distal pulses palpable.  Both feet  are warm and well-perfused.   I reviewed his lower extremity arterial Doppler exam and interpreted it  as well.  His ABIs are stable with ABI on the left being 1.16 and on the  right being 0.87 with biphasic flow distally.   This patient does have superficial femoral occlusive disease and tibial  disease, right worse than left, but he is quite stable and does not  require any further treatment.  He will continue to take his Pletal  twice daily which does seem to be improving the situation and we will  see him on an annual basis to follow this problem.  If his symptoms  worsen he will be in touch with me.     [name], M.D.  Electronically Signed   JDL/MEDQ  D:  08/31/2010  T:  09/01/2010  Job:  [id number]   cc:  Shanna Cisco., MD  Marijo Conception Wall, MD, [facility]

## 2011-05-10 NOTE — Consult Note (Signed)
VASCULAR SURGERY CONSULTATION   Andrew, [name] A  DOB:  [birth year]                                       11/20/2007  [id number]   This is a vascular surgery consultation.   The patient is a 77 year old male with a history of an abdominal aortic  aneurysm resection, left renal artery bypass graft performed by Dr. Drucie [name] in 1997.  He has also had coronary artery bypass graft in 1992.  He has recently developed increasing discomfort in his right calf with  ambulation usually about 2 blocks requiring him to stop for about a  minute and then able to proceed.  He has had no history of rest pain,  nonhealing ulcers, infection, or numbness in the foot.  He has no  symptoms in the contralateral left leg.  He does have history of deep  venous thrombosis in the right leg 2 years ago and was treated with  anticoagulation __________ Coumadin until recently when this was  discontinued by Dr. Mar [name].  He is currently on aspirin only.  There  is some question of a localized occlusion of his right superficial  femoral artery on a previous study and he has been followed by Dr. Rushie [name] and the [facility] prior to Dr. Albertine [name] moving out of town.   PAST MEDICAL HISTORY:  1. Hypertension.  2. Coronary artery disease status post coronary artery bypass graft in      1992.  3. Negative for diabetes, COPD, stroke, or hyperlipidemia.   PREVIOUS SURGERY:  1. Coronary artery bypass grafting.  2. Abdominal aortic aneurysm left renal artery bypass.  3. Bilateral knee surgery, no replacements.   FAMILY HISTORY:  Positive for coronary artery disease in a brother,  diabetes in a brother and a grandmother, and negative for stroke.   SOCIAL HISTORY:  He is married and has 2 children.  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  He has not used cigarettes since 1985  and does not use alcohol.   REVIEW OF SYSTEMS:  Please see health history form.   MEDICATIONS:   Please see health history form.   ALLERGIES:  NONE.   PHYSICAL EXAM:  Blood pressure 150/82.  Heart rate is 80.  Respirations  are 12.  General:  He is an obese, elderly male in no apparent distress.  Alert and oriented x3.  Neck:  Supple, 3+ carotid pulses are palpable.  No bruits are audible.  Neurologic:  Exam is normal.  No palpable  adenopathy in the neck.  Upper Extremities:  Pulses 3+ bilaterally.  Chest:  Clear to auscultation. Cardiovascular:  Exam reveals a regular  rhythm with no murmurs.  Skin:  Exam reveals some scaly dermatitis in  both lower extremities in the lower third of the legs with no  ulceration.  Abdomen:  Soft and obese.  No palpable masses.  He has 3+  femoral and 2+ popliteal pulses bilaterally.  Left leg has a 2+  posterior tibial and right leg a 1 to 2+ posterior tibial pulse.  He  appears to have some chronic venous insufficiency in both lower  extremities.   Lower extremity arterial Dopplers today in our office revealed ABI 0.87  on the right and 1.09 on the left with triphasic flow on the left and  biphasic flow on the  right.   I suspect he does have some right superficial femoral occlusive disease  but he is getting along relatively well at the present time and is not  being limited terribly.  He will try to increase his exercise and walk  longer distances to see if this will improve his walking ability.  If he  decides he would like to have this further evaluated in the short term  he will let us know and we will set him up for an angiogram by Dr. Amedeo [name]  with possible intervention to the right superficial femoral artery.  Otherwise, he will return in 6 months with repeat ABIs for further  evaluation by Dr. Amedeo [name].   Andrew [name], M.D.  Electronically Signed  Andrew [name]/MEDQ  D:  11/20/2007  T:  11/21/2007  Job:  [id number]   cc:   Andrew C. Verl Blalock, MD, [facility]  Andrew [name]., MD

## 2011-05-10 NOTE — Assessment & Plan Note (Signed)
[place] OFFICE NOTE   Andrew [name], Andrew [name]                     MRN:          [id number]  DATE:10/25/2007                            DOB:          [birth year]    SUBJECTIVE:  Andrew [name] returns today for further management of the  following issues:  1. Coronary artery disease, status post coronary artery bypass      grafting in 1995.  He has also had a stent placed in his posterior      descending vessel of the right coronary artery in January of 2005.      His last adenosine Myoview in 2006 showed an ejection fraction of      41% with mild to moderate peri-infarct ischemia of the lateral      wall.  He had akinesia of the lateral wall.  He had a posterior      lateral branch that was too small to stent.  We have been treating      him medically.  He is currently having no angina.  He does have      some dyspnea on exertion.  2. Peripheral vascular disease, status post abdominal aortic aneurysm      repair and left femoral artery bypass by Dr. Nancie Neas in 1997.      He has been treated off and on for claudication, particularly of      the right lower extremity.  Dr. Albertine Patricia has seen him in the past and      recommended medical therapy.  This is getting worse and happens      within about two blocks of walking.  He would really like a second      opinion.  Dr. Albertine Patricia has now moved to [place].  I am going to      send him back to Dr. Amedeo Plenty.  3. Hypertension.  4. Obesity.  5. Mixed hyperlipidemia.  6. ACE INHIBITOR INTOLERANCE secondary to cough.  7. His biggest complaint is being extra thirsty.  He says he has never      had much appeal for water but now it is his drink of choice. He is      worried about diabetes.  8. History of deep venous thrombosis of the right lower extremity in      2006.  He has been on Warfarin ever since.   CURRENT MEDICATIONS:  1. Zetia 10 mg daily.  2. Lipitor 20 mg  daily.  3. Tricor 145 mg daily.  4. Nexium p.r.n.  5. Aspirin 81 mg daily.  6. Multivitamin daily.  7. Lasix 40 mg every other day.  8. Nifedipine 30 mg a day.  9. Toprol XL 50 mg a day.  10.Diovan 80 mg daily.  11.Warfarin as directed.  12.Glucosamine chondroitin daily.  13.Potassium over the counter.   PHYSICAL EXAMINATION:  VITAL SIGNS:  His blood pressure today is 124/78,  his pulse is 87 and regular.  His electrocardiogram shows normal sinus  rhythm with left anterior fascicular block, no changes from before.  He  has somewhat low  voltage in the leads.  His weight is 213, which is down  7.  HEENT:  Normocephalic, atraumatic. Pupils equal, round, reactive to  light and accommodation. Extraocular movements intact. Sclerae clear.  Facial symmetry is normal.  He has a mustache.  NECK:  Carotid upstrokes are equal bilaterally without bruits.  No  jugular venous distention.  Thyroid is not enlarged.  Trachea is  midline.  LUNGS:  Clear.  HEART:  Reveals a regular rate and rhythm without gallop.  PMI could not  be appreciated.  ABDOMEN:  Protuberant, with good bowel sounds.  There is no obvious  bruit.  EXTREMITIES:  Reveal his feet to be dusky, particularly the right.  He  has chronic ischemic changes of both legs from the knees down.  His  dorsalis pedis pulse on the right is 1 at most.  Posterior tibial was  difficult to feel.  On the left he has a 2+/4+ dorsalis pedis pulse,  very difficult to feel posterior tibial.  Capillary refill time is  obviously down.  NEUROLOGICAL:  Exam is intact.  SKIN:  Unremarkable.   DISCUSSION:  I had a long talk with Andrew [name] today.  I am concerned  about his claudication worsening and he wants to look into this further.  I am going to send him back to Dr. Nancie Neas for reassessment.  In  addition, he has been on Warfarin for a long enough time for his history  of deep venous thrombosis.  We will go ahead and stop this also because   Dr. Amedeo Plenty is probably going to have to do a procedure.  In addition, I  have him to get fasting blood work including a blood sugar with Dr.  Joni Fears to rule out diabetes.   We will obtain an adenosine Myoview to rule out any increased ischemia  from his previous study.  If this is stable, we will see him back again  in six months.     Thomas C. Verl Blalock, MD, [facility]  Electronically Signed    TCW/MedQ  DD: 10/25/2007  DT: 10/26/2007  Job #: [id number]   cc:   Dorothea Glassman, M.D.  Shanna Cisco., MD

## 2011-05-10 NOTE — H&P (Signed)
NAME:  Andrew [name], Andrew [name] NO.:  [id number]   MEDICAL RECORD NO.:  [id number]          PATIENT TYPE:  INP   LOCATION:  [id number]                         FACILITY:  [facility]   PHYSICIAN:  Marijo Conception. Wall, MD, FACCDATE OF BIRTH:  09-Sep-1928   DATE OF ADMISSION:  12/10/2008  DATE OF DISCHARGE:                              HISTORY & PHYSICAL   CHIEF COMPLAINT:  Chest discomfort, even at night over the last 3 days.   HISTORY OF PRESENT ILLNESS:  [name] is a 77 year old gentleman,  well known to our service who comes in with what sounds like crescendo  angina.  He has had a history of some chronic angina with medically  treated coronary artery disease.  Please see the past medical history  for details.   Over the last 3 days, he has had substernal chest discomfort and at  times very sharp pain as well.  He responses to nitroglycerin within  about 5 minutes.  He has taken several nitroglycerin, which is very  unusual for him.  Usually, his symptoms are exertion related.   He denies any nausea, vomiting, pleuritic chest pain, or hemoptysis.   He came to the office with this story where we admitted him for further  evaluation and diagnostic catheterization.   PAST MEDICAL HISTORY:  He is intolerant of PRINIVIL with cough.   CURRENT MEDICATIONS:  1. Zetia 10 mg a day.  2. Aspirin 81 mg a day.  3. Multivitamin daily.  4. Nifedipine 30 mg per day.  5. Diovan 80 mg per day.  6. B vitamin every other day.  7. Glucosamine chondroitin b.i.d.  8. Potassium over the counter.  9. Celebrex 400 mg b.i.d.  10.Alprazolam 0.25 a half daily.  11.Glipizide 20 mg b.i.d.  12.Simvastatin 80 mg daily.  13.Toprol-XL 50 mg b.i.d.  14.TriCor 134 mg per day.  15.Lasix 40 one to two a day.  16.Pletal 200 mg per day.   He has a history of very complex coronary artery disease.  He is status  post coronary artery bypass grafting x3 in 1995.  His last  catheterization was in May 2002, at which  time he was found to have a  superdominant right coronary artery with a 95% lesion in the PDA.  This  was subsequently stented in the Taxus trial.  This was on May 22, 2001.  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  He has had a history of  chronic angina and some atypical chest pain but rarely uses  nitroglycerin until last 3 days.   His last adenosine Myoview was on November 06, 2007, which showed EF 37%  with anterolateral wall infarct with mild-to-moderate peri-infarct  ischemia.  This was very similar to a Myoview that had been done in  2006.  At that time, we reviewed his films and felt like the  posterolateral branch will be too small to intervene upon.   He also has poorly controlled diabetes.  He is very noncompliant with  his diet.  Hemoglobin A1c  is routinely run 9-11%.  He has history of  obesity, sedentary lifestyle, hypertension, chronic kidney disease with  a creatinine of 1.5 baseline, hyperlipidemia, and peripheral vascular  disease treated medically.  He is being followed by Dr. Nancie Neas for  bilateral superficial femoral artery stenoses.  He has a history of  abdominal aortic aneurysm with left renal artery bypass in the past.  He  has had bilateral knee replacements.   His family history is positive for coronary artery disease in a brother,  diabetes in a brother and is negative for CVA.   SOCIAL HISTORY:  He is married and has 2 children.  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  He has not smoked any  cigarettes since 1985 and does not use any alcohol.   His review of systems other than the HPI is negative.   PHYSICAL EXAMINATION:  GENERAL:  He is very pleasant as always.  He  seems more worried than usual, however.  He is usually fairly jovial.  VITAL SIGNS:  His blood pressure is 146/82.  His  pulse is 83 and  regular.  His weight is 218.  HEENT:  He has a somewhat ruddy complexion.  Arcus senilis.  PERRLA.  Extraocular movement is intact.  Facial symmetry is normal.  Dentition  need some repair.  NECK:  Supple.  Carotid upstrokes are equal bilaterally without obvious  bruits.  Thyroid is not enlarged.  Trachea is midline.  LUNGS:  Clear to auscultation and percussion.  HEART:  Poorly appreciated PMI, normal S1 and S2.  No murmur, rub, or  gallop.  ABDOMEN:  Obese with good bowel sounds.  No obvious midline bruit or  flank bruit, it was difficult to hear.  No obvious organomegaly.  EXTREMITIES:  Only 1+ pitting edema.  Pulses were 2+/4+ bilaterally  symmetrical.  Femoral pulses were palpable bilaterally.  NEURO:  Grossly intact.   EKG is remarkable that shows normal sinus rhythm with left anterior  fascicular block, which is stable.   ASSESSMENT:  [name] is a very complicated vascular patient who is  having unstable or crescendo angina.  He has used nitroglycerin over the  past 3 days, as he had 1 episode of nocturnal pain.  As outlined above,  he has a very complex coronary anatomy and has had previous coronary  artery bypass surgery x3 with his last cath on May 22, 2001, showing 2  posterolateral saphenous vein grafts to be patent with a superdominant  right coronary artery, which was subsequently stented on May 22, 2001.  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  He has had a stable Myoview over the last several  years with anterolateral wall infarct with peri-infarct ischemia, which  has been treated medically.  He has reduced left ventricular function  with ejection fraction around 40%.   He has uncontrolled risk factors including poorly controlled diabetes,  obesity, but is compliant with his medications.  He also has peripheral  vascular disease with abdominal aortic aneurysm, renal artery bypass, as  well as  lower extremity disease as well.   PLAN:  1. Admit to telemetry by EMS from the office.  2. IV heparin per Pharmacy.  3. Hydration.  4. IV nitroglycerin.  5. Check cardiac enzymes.  6. Cardiac catheterization with coronaries and grafts only in the  morning.  We will try to limit the amount of dye exposure.  7. A 2-D echocardiogram to assess LV function.   Indications, risks, and benefits were discussed with the patient.  He  agrees to proceed.      Thomas C. Verl Blalock, MD, [facility]  Electronically Signed     TCW/MEDQ  D:  12/10/2008  T:  12/11/2008  Job:  [id number]   cc:   Shanna Cisco., MD  [facility] [place]

## 2011-05-10 NOTE — Cardiovascular Report (Signed)
NAME:  Andrew [name], Andrew [name] NO.:  [id number]   MEDICAL RECORD NO.:  [id number]          PATIENT TYPE:  INP   LOCATION:  2807                         FACILITY:  [facility]   PHYSICIAN:  Lauree Chandler, MDDATE OF BIRTH:  November 02, 1928   DATE OF PROCEDURE:  12/11/2008  DATE OF DISCHARGE:                            CARDIAC CATHETERIZATION   PRIMARY CARDIOLOGIST:  Marcello Moores C. Wall, MD, [facility]   PROCEDURES PERFORMED:  1. Left heart catheterization  2. Selective coronary angiography.  3. Saphenous vein graft angiography.  4. Placement of a Vision bare-metal stent in the mid right coronary      artery.   OPERATOR:  Lauree Chandler, MD   INDICATIONS:  Unstable angina in a patient with known coronary artery  disease status post three-vessel CABG in 1992 and subsequent placement  of a drug-eluting stent in the posterior descending artery in 2005.   PROCEDURE IN DETAIL:  The patient was brought to Cardiac Catheterization  Laboratory after signing informed consent for the procedure.  The right  groin was prepped and draped in a sterile fashion.  A 5-French sheath  was inserted into the right femoral artery without difficulty.  Lidocaine 1% was used for local anesthesia.  I initially performed a  selective injection of the right coronary artery with a JR-4 catheter.  The JR-4 catheter was also used to selectively engage and inject the  saphenous vein graft to the PDA.  I did not attempt to engage the right  internal mammary artery graft and this was known to be occluded.  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  Following  selective injection of the left coronary system, a pigtail catheter was  used to cross the aortic valve.  No left ventricular angiogram was  performed.  The pigtail catheter was pullback across the aortic valve  with no significant pressure gradient  measured.   The patient had a 99% stenosis of the mid right coronary artery.  I  elected to perform intervention of this vessel.  During the diagnostic  portion of the procedure, the right coronary artery which is a very  large superdominant vessel had TIMI III flow.  When I initially engaged  my guiding catheter, there was no flow past the lesion in the mid right  coronary artery.  The patient began to complain of chest pain.  Angiomax  was being used for anticoagulation.  I also elected to use Integrilin in  this situation with complete shutdown of the vessel.  I initially  attempted to use a Cougar intracoronary wire, but could not cross the  lesion.  This midportion of the vessel was severely ectatic with  aneurysmal areas and because of this it was difficult to get a wire in  the true lumen.  I then used an over the wire balloon and positioned  this in the distal portion of the aneurysmal segment.  A Fielder wire  was then used to pass down the length of the right coronary artery.  Flow was reestablished  with wire placement.  A 1.5 x 15 mm balloon was  then inflated in the area of tightest stenosis.  I then used a 2.5 x 15  mm balloon for dilatation.  A 4.0 x 15 mm Vision bare-metal stent was  then placed in the mid right coronary artery.  A 4.5 x 12 mm  noncompliant balloon was used for post-dilatation of the lesion.  There  was an acceptable angiographic result.  Once again, this vessel was  severely tortuous with aneurysmal segments.  The stent was well-opposed  in the area of tightest stenosis.  The patient tolerated the procedure  well.  Angiomax was stopped in the cath lab.  The Integrilin will be  continued for 18 hours.  The patient was given 600 mg of Plavix on the  table.  Manual pressure will be used for hemostasis.   ANGIOGRAPHIC FINDINGS:  1. The right coronary artery is a very large superdominant vessel that      courses around the apex of the heart to supply at least  the distal      third of the anterior wall and the entirety of the apex.  The      proximal and mid right coronary artery are severely ectatic with      aneurysmal segments.  There is a 99% stenosis in the mid right      coronary artery between two large aneurysmal segments.  The stented      segment of the posterior descending artery is patent.  The      posterior descending artery wraps around the apex and has no      significant obstructive disease.  2. Sequential saphenous vein grafts to the posterolateral segment      showed that one arm of this graft to the posterolateral segment is      patent.  The other arm of the sequential vein graft is known to be      occluded.  In comparing this film to the film for 4 years ago,      there is a limb of the posterolateral segment that was filling      retrograde from this vein graft, however, now the vein graft only      supplies one posterolateral segment.  The right internal mammary      artery graft to the PDA is known to be occluded.  3. The left main coronary artery arises from the right coronary cusp.      There is an atrial branch that arises early and has an 80% proximal      stenosis, but is unchanged from the prior film.  The left main then      continues and gives rise to the circumflex and LAD that are both      very small systems.  There is some plaque disease noted in the      ostium of the left main, but this does not seem to be significant.  4. The left anterior descending is a small vessel that supplies only      approximately half of the anterior wall with the rest being      supplied by the right PDA.  There is no significant obstructive      disease in the left anterior descending vessel.  5. The circumflex artery is a relatively small vessel that gives rise      to a single obtuse marginal branch.  There is no significant  disease noted.  6. No left ventricular angiogram was performed.   HEMODYNAMIC FINDINGS:   Central aortic pressure 168/88.  Left ventricular  pressure 179/35.  End-diastolic pressure 48.   IMPRESSION:  1. Successful percutaneous coronary intervention with placement of a      bare-metal stent in a severely tortuous, ectatic right coronary      artery.  2. Single-vessel coronary artery disease status post three-vessel      coronary bypass surgery with all 3 of the grafts leading to the      right coronary artery.  Only one of these grafts is patent      currently.   RECOMMENDATIONS:  The patient will have an echocardiogram to assess his  left ventricular function.  He is to be continued on aspirin and Plavix  for at least 6 weeks.  I would prefer that he remain on the Plavix for a  year if he tolerates this medication.  His end-diastolic filling  pressures are also elevated because of this, I think he would benefit  from some diuresis.      Lauree Chandler, MD  Electronically Signed     CM/MEDQ  D:  12/11/2008  T:  12/11/2008  Job:  [id number]   cc:   Thomas C. Wall, MD, [facility]

## 2011-05-10 NOTE — Assessment & Plan Note (Signed)
[facility] OFFICE NOTE   Naetochukwu, Mastandrea TOLUWALASE MITROVIC                     MRN:          [id number]  DATE:10/30/2008                            DOB:          [birth year]    Hamp comes in today for followup.  He has been having a little bit  more of tightness in his chest and shortness of breath, particularly  after eating a large meal and trying to walk.  He denies any nocturnal  or rest discomfort.   He is also very much plagued by his knees.  I think there is a lot of  effort to walking with his weight and his knee problems.   His medications are unchanged since last visit.   His last stress Myoview was in November 2008, which showed no change.  He has anterolateral hypokinesia.  His LV is mildly dilated.  He has an  EF of 37% with moderate peri-infarct ischemia.  We have been treating  him medically.   PHYSICAL EXAMINATION:  He is very pleasant as usual.  His blood pressure  is 132/80, his pulse 78 and regular.  His weight is 221 which is up  additional 4 pounds.  HEENT is normal.  Neck shows no JVD.  Carotid  upstrokes were equal bilaterally without bruits.  No thyromegaly.  Lungs  are clear to auscultation and percussion.  Heart reveals soft S1 and S2.  PMI is hard to appreciate.  Abdominal exam is protuberant.  Good bowel  sounds.  No obvious pulsatile mass or bruit.  Extremities reveal some  chronic brawny edematous changes.  His pulses are reduced, but present.  Neuro exam is intact.   His EKG today shows normal sinus rhythm with a left anterior fascicular  block.  There has been no change.   I had a long talk with Iona Beard today.  I have encouraged him to take  nitroglycerin when he has chest discomfort.  If he takes more than 2, he  needs call 911.  In addition, I think cutting back on meal size and  trying to reduce his weight will help dramatically.  I do expect him to  have some exertional angina.   If his cycles or frequency increase, he will let me know.  Otherwise, I  will see him back again in 3 months for close followup.     Thomas C. Verl Blalock, MD, [facility]  Electronically Signed    TCW/MedQ  DD: 10/30/2008  DT: 10/30/2008  Job #: [id number]

## 2011-05-13 NOTE — Discharge Summary (Signed)
NAME:  Andrew [name], Andrew [name] NO.:  [id number]   MEDICAL RECORD NO.:  [id number]          PATIENT TYPE:  INP   LOCATION:  1408                         FACILITY:  [facility]   PHYSICIAN:  Gwendolyn Grant, M.D. LHCDATE OF BIRTH:  12/12/1928   DATE OF ADMISSION:  08/14/2005  DATE OF DISCHARGE:                                 DISCHARGE SUMMARY   OBSERVATION STAY   DISCHARGE DIAGNOSES:  1.  Acute right lower extremity deep venous thrombosis common femoral vein      by Doppler ultrasound. CT negative for pulmonary embolus. Continue      Lovenox plus Coumadin minimum 9-12 months anticoagulation treatment, to      be followed by primary M.D. Consider referral to Coumadin Clinic at      primary M.D.'s discretion. Interim follow-up with home health for      Lovenox administration and protime checks to be called to Dr. Joni Fears.  2.  History of coronary disease status post coronary artery bypass grafting      1992, status post catheterization January 2005 with stent to posterior      descending coronary artery. Continue medical management - Dr. Verl Blalock,      cardiologist, p.r.n.  3.  Dyslipidemia.  4.  Hypertension.  5.  Abdominal aortic aneurysm surgery in 1997.  6.  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  7.  History of gastroesophageal reflux disease.   DISCHARGE MEDICATIONS:  1.  Lovenox 100 mg subcu q.12h. x5-7 days until INR greater than or equal to      2 for 24-hour overlap.  2.  Coumadin 5 mg p.o. q.p.m. or doses to be determined by primary M.D.      according to Coumadin titration in the next several days.   HOSPITAL FOLLOW-UP:  With primary M.D., Dr. Joni Fears, for Thursday, August 18, 2005 at 1 p.m. In the meanwhile, home health will check a protime on  Wednesday August 23 and call these results of the INR to primary M.D.'s  office for further instructions on Coumadin. Continue Lovenox until INR  greater than or equal to 2 x24 hours.   OTHER  MEDICATIONS:  Are as prior to admission and include:  1.  Diovan 80 mg daily.  2.  Prozac 20 mg daily.  3.  Lasix 40 mg every-other day.  4.  Toprol-XL 50 mg daily.  5.  Xanax 0.25 mg daily.  6.  Crestor dose unknown one q.h.s., continue same.  7.  __________ 30 mg p.o. daily.  8.  Vitamin B three times weekly.  9.  Cilostazol 100 mg p.o. b.i.d.  10. Aspirin 81 mg daily (reduced from full-dose strength).  11. NitroQuick p.r.n.  12. Potassium p.r.n. with Lasix.  13. Multivitamin one daily.  14. Restoril 30 mg p.o. q.h.s. p.r.n.  15. Glucosamine b.i.d.  16. Aleve p.r.n., Tylenol p.r.n.  17. Also given a prescription for Vicodin one to two p.o. q.4h. p.r.n.      severe pain associated with right leg swelling.   DISPOSITION:  The patient is discharged home with explanation of diagnosis  and  outpatient treatment and need for follow-up. He will be followed by home  health being arranged at time of discharge assuming the patient is approved  by his insurance for outpatient treatment with Lovenox of his DVT.   HOSPITAL COURSE BY PROBLEM:  #1 - ACUTE RIGHT LOWER EXTREMITY DEEP VENOUS  THROMBOSIS. The patient is a pleasant 77 year old gentleman who had acute  onset swelling in his right lower extremity the day prior to admission. He  came to the emergency room because of continued pain and firmness in his  right thigh where lower extremity Dopplers confirmed a common femoral DVT  occlusion. He was seen in consultation by CVTS, Dr. Kellie Simmering, who agreed with  Lovenox and Coumadin and referred to primary care for hospital admission to  coordinate outpatient versus inpatient care. CT chest was negative for PE.  No other abnormalities could be identified besides the acute swelling  associated with DVT. The patient was treated with subcutaneous Lovenox  q.12h. per pharmacy protocol at a dosing of 1 mg/kg as well as initiation of  Coumadin. Prior to discharge the patient's pain and swelling had been  well  controlled with only Tylenol and he is ambulatory with minimal pain.  Discussed need for continued anticoagulation with Lovenox plus Coumadin in  the short interim, as well as long-term Coumadin 9-12 months, perhaps longer  depending on course though no other history of previous clots thus unlikely  to need lifelong Coumadin at this time. As stated above, will be followed by  home health for Lovenox administration, protime monitoring until further  follow-up with primary M.D. on Thursday of this week. The patient is  instructed to return to the emergency room for problems of increased pain,  swelling, bleeding, or other issues if unable to have these answered by  primary M.D. or home health.   #2 - OTHER MEDICAL ISSUES. The patient's other medical issues are as prior  to admission according to the problem list above. He is to continue his home  medications as prior to admission with the minor change of reducing full-  dose aspirin to 81 mg daily. Hospital follow-up with Dr. Verl Blalock of cardiology  on an as-needed basis.      Gwendolyn Grant, M.D. [facility]  Electronically Signed     VL/MEDQ  D:  08/15/2005  T:  08/15/2005  Job:  [id number]

## 2011-05-13 NOTE — H&P (Signed)
NAME:  Andrew [name], Andrew [name] NO.:  [id number]   MEDICAL RECORD NO.:  [id number]          PATIENT TYPE:  INP   LOCATION:  1408                         [facility]ACILITY:  [facility]   PHYSICIAN:  Evie Lacks. Plotnikov, M.D. LHCDATE OF BIRTH:  1928/11/04   DATE OF ADMISSION:  08/14/2005  DATE OF DISCHARGE:                                HISTORY & PHYSICAL   CHIEF COMPLAINT:  Right leg swelling and pain.   HISTORY OF PRESENT ILLNESS:  The patient is a 77 year old male who played  water volleyball yesterday around 4 p.m. and developed pain and swelling in  the right leg, continued to play for an hour, the pain was getting worse.  He went to ER, was seen by Dr. Kellie Simmering around 3:30 a.m., CT of the leg and  abdomen with pelvis was ordered.  He denies chest pain or shortness of  breath; no recent travel, no previous problems with blood clots.   CURRENT MEDICINES:  1.  Diovan 80 mg daily.  2.  Prozac 20 mg daily.  3.  Furosemide 40 mg daily.  4.  Toprol XL 50 mg daily.  5.  Alprazolam 0.25 mg daily.  6.  Foltx daily.  7.  Nifediac 30 mg daily.  8.  Pletal 100 mg twice a day.  9.  Temazepam p.r.n.  10. NitroQuick p.r.n.   ALLERGIES:  None.   PAST MEDICAL HISTORY:  Coronary disease, bypass surgery, AAA surgery by Dr.  Amedeo Plenty, hypertension, bilateral knee surgery remote.   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.  Quit smoking 20 years ago.  He is married.  Very little alcohol.   FAMILY HISTORY:  Negative for DVT.   REVIEW OF SYSTEMS:  Again, negative for chest pain, shortness of breath,  quite a bit of pain in the right leg, has a history of borderline diabetes,  occasional knee pain, he did not take aspirin for the past week, the rest is  negative.   PHYSICAL EXAMINATION:  VITAL SIGNS:  Temperature 97.8, blood pressure  137/71, heart rate 103, respirations 20, saturations 98% on room air.  GENERAL:  He is in no acute distress.  HEENT:  Moist mucosa.  Neck supple.  LUNGS:   Clear; no wheezes or rales.  HEART:  S1-S2; no murmur, no gallop.  ABDOMEN:  Soft, nontender; no organomegaly or mass felt.  EXTREMITIES:  Left lower extremity without edema.  Scars on both knees well  healed.  Right leg swollen up to upper thigh 1+ to 2+, tender to palpation,  slightly cooler.  Peripheral pulses decreased on the right, apparently  normal in the left.  Skin without much discoloration; no rashes.   LABORATORIES:  Hemoglobin 11, platelets 206, white count 7.1, sodium 137,  potassium 4, creatinine 1.3, glucose 179, ALT 45, alkaline phosphatase 41,  fibrin D-dimer 5.63 elevated, INR 1.   ASSESSMENT AND PLAN:  1.  Right lower extremity swelling and pain most likely acute deep vein      thrombosis.  CT scan is pending.  He was started on Lovenox.  CT of the  abdomen and leg pending.  Status post cardiovascular thoracic surgery      consultation by Dr. Kellie Simmering.  2.  Elevated glucose.  I will check A1c.  Start sliding scale Humalog.  3.  Hypertension.  On therapy.  4.  Coronary artery disease.  On therapy.  5.  Peripheral vascular disease.  On Pletal.           ______________________________  Evie Lacks Plotnikov, M.D. [facility]     AVP/MEDQ  D:  08/14/2005  T:  08/14/2005  Job:  [id number]   cc:   Dr. Joni Fears   Dr. [facility]

## 2011-05-13 NOTE — Consult Note (Signed)
NAME:  Andrew [name], PIVARNIK NO.:  [id number]   MEDICAL RECORD NO.:  [id number]          PATIENT TYPE:  INP   LOCATION:  [place]                         FACILITY:  [facility]   PHYSICIAN:  Nelda Severe. Kellie Simmering, M.D.  DATE OF BIRTH:  01/30/1928   DATE OF CONSULTATION:  08/14/2005  DATE OF DISCHARGE:                                   CONSULTATION   REFERRED BY:  Vikki Ports, M.D. in the [facility] Emergency Department.   CHIEF COMPLAINT:  Swelling and pain, right leg.   HISTORY OF PRESENT ILLNESS:  This 77 year old patient developed acute onset  of swelling and pain in the right leg beginning in the groin area and  extending down into the calf. This started at 4:00 p.m. while playing water  polo. He has no history of swelling in the right leg or deep venous  thrombosis or pulmonary emboli and has no symptoms in the contralateral left  leg. He does have a chronic history of right calf claudication and has been  told by Dr. Verl Blalock that he has blockages in his right leg. These symptoms  have been quite stable. He also notes that he has had some right-sided  discomfort over the left 3-4 months in the flank and back regions.   PAST MEDICAL HISTORY:  1.  Hypertension.  2.  Coronary artery disease.  3.  Borderline diabetes mellitus.  4.  Negative for CVA, COPD, or pulmonary emboli.   PAST SURGICAL HISTORY:  1.  Coronary artery bypass grafting.  2.  Right knee surgery.  3.  Abdominal aortic aneurysm resection (Dr. Amedeo Plenty).   ALLERGIES:  None known.   PHYSICAL EXAMINATION:  VITAL SIGNS:  Temperature 97.8, blood pressure  137/74, heart rate 100, respirations 20.  GENERAL:  He is an obese male patient who is in no apparent distress. He is  alert and oriented x3. His neck is supple; 3+ carotid pulses palpable. Upper  extremity pulses 3+ bilaterally.  CHEST:  Clear to auscultation.  CARDIOVASCULAR:  Reveals regular rhythm. No murmurs.  ABDOMEN:  Soft and nontender, slightly obese. He  has a midline abdominal  incision, well healed. He does have a umbilical hernia.  EXTREMITIES: Reveals diffuse 3+ edema throughout the right leg from the  groin to the ankle with the calf and thigh being tight and about 3-4 cm  larger than the contralateral left side. He has no swelling of the left leg.  Arterial pulses 3+ left femoral popliteal, dorsalis pedis area. Right leg  has a 2-3+ femoral pulse. Popliteal and distal pulses are not palpable. He  does have good Doppler flow in the right dorsalis pedis which is biphasic.  Bedside Doppler exam revealed very diminished flow in the right common  femoral vein with no augmentation to distal compression.   IMPRESSION:  1.  Acute iliofemoral deep venous thrombosis, right leg.  2.  History of coronary artery disease.  3.  Hypertension.  4.  Status post abdominal aortic aneurysm resection.  5.  History of stable claudication, right leg.   RECOMMENDATIONS:  Would admit the patient for IV heparin  and then convert to  Lovenox and Coumadin as an outpatient. He needs long-leg elastic compression  stocking on the right leg and maximal elevation to treat the edema and would  continue Coumadin for 9-12 months..           ______________________________  [name], M.D.     JDL/MEDQ  D:  08/14/2005  T:  08/14/2005  Job:  [id number]

## 2011-05-13 NOTE — Discharge Summary (Signed)
[place]. [facility] ([place])  Patient:    Andrew [name], Andrew [name]                     MRN: [id number] Adm. Date:  [id number] Disc. Date: [id number] Attending:  Fatima Sanger Dictator:   Jannette Fogo, M.D. CC:         Scharlene Gloss., M.D.  Thomas C. Wall, M.D. [facility]  Bruce R. Olevia Perches, M.D. [facility]   Discharge Summary  DISCHARGE DIAGNOSES: 1. Coronary artery disease, status post cardiac catheterization on May 24, and    stenting of the posterior descending branch of the right coronary artery    (95% to 0%) on May 22, 2001.  Postoperative bleeding complication at    catheterization site. 2. Hypertension. 3. Hyperlipidemia. 4. Status post abdominal aortic aneurysm repair with left renal artery graft    in 1997. 5. Status post coronary artery bypass graft x 3 in 1992. 6. 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  DISCHARGE MEDICATIONS: 1. Lasix 40 mg one p.o. q.d. 2. Pravachol 40 mg one p.o. q.h.s. 3. Xanax 0.5 mg 1/2 tablet p.o. q.d. 4. Coated aspirin daily. 5. Prozac 40 mg one p.o. q.d. 6. Procardia XL 30 mg one p.o. q.d. 7. Diovan 80 mg one p.o. q.d. 8. Toprol XL 50 mg one p.o. q.d. 9. Plavix 75 mg one p.o. q.d. x 4 weeks.  FOLLOWUP:  [name] has a follow-up appointment at [facility] on June 14, at 9:15 a.m. for a wound check.  An abdominal CT obtained on May 29, to rule out retroperitoneal hematoma showed some questionable low attenuation lesions in the liver as well as bilateral renal cysts.  These were incidental findings and will need to be followed up on an as needed basis by his primary care physician, Dr. Joni Fears.  CONSULTATIONS:  None.  PROCEDURES: 1. Cardiac catheterization with stenting of the posterior descending branch of    the right coronary artery with improvement and percent diameter narrowing    from 95% to 0%.  The patient enrolled in TAXUS IV trial.  Previous    catheterization by Dr.  Vicenta Aly on May 18, 2001, showed a 95% stenosis in    the posterior descending branch of the right coronary artery which was not    grafted.  A patent vein graft to the two big posterolateral branches of the    right coronary artery were noted.  The left coronary system arose from the    right coronary cusp and consisted of a small circumflex artery and a small    left anterior descending which had a 70% stenosis.  Left ventricular    function was good. 2. Computed tomography of head without contrast media on May 22, 2001.  No    hemorrhage, infarction, mass, lesion or other significant abnormality. 3. Computed tomography of abdomen/pelvis without contrast media on May 23, 2001.  Negative for retroperitoneal hematoma.  Low attenuation region in    the posterior right liver of uncertain significance.  A mass or a vascular    abnormality, including infarct, cannot be excluded.  A contrast-enhanced    liver computed tomography or magnetic resonance imaging is recommended for    further evaluation once the patient is stable.  Bilateral renal masses,    possibly cysts, are noted.  HISTORY OF PRESENT ILLNESS:  [name] is a 77 year old, white male with known coronary artery disease, status post coronary artery bypass graft x  3 in 1992, with numerous cardiac risks factors including obesity, hypertension and hyperlipidemia.  He was seen by Dr. Jenell Milliner on Apr 25, 2001, (for the first time in three years) and subsequently underwent a CL scan which showed significant inferior and mild to moderate anterolateral ischemia.  Ejection fraction was 52%.  He was admitted to [facility] on May 24, for cardiac catheterization by Dr. Elta Guadeloupe Pulsipher.  He was found to have diffuse ectasia in the native RCA with 95% stenosis in the posterior descending branch which was not grafted.  He had a patent graft to the two large posterolateral branches of the RCA.  The left coronary system arose from the right  coronary cusp and consisted of a small circumflex artery and a small LAD which had 70% stenosis.  Left ventricular function was good with an estimated ejection fraction of 58%.  He was discharged after the procedure and readmitted on May 28, for intervention of the posterior descending branch of the RCA.  LABORATORY DATA AND X-RAY FINDINGS:  None.  HOSPITAL COURSE:  [name] was admitted and underwent stenting of the posterior branch of the RCA as outlined above.  He tolerated the procedure well, however, he developed bleeding at the right groin catheterization site approximately two hours after sheath removal, when the wedge dressing covering the wound became dislodged.  He became hypotensive with a blood pressure in the 60s/30s and was given a fluid bolus.  His condition rapidly improved after the fluid bolus and reapplication of the pressure dressing.  Approximately four hours later, however, he suffered what appeared to be a seizure with generalized shaking of his extremities for approximately one minute.  This was witnessed by the patients wife and a nurse.  He was not postictal after the event.  Head CT was obtained to rule out intracranial hemorrhage and was negative.  He had no further seizure like activity during hospitalization.  On the following morning, an abdominal CT was obtained to rule out a retroperitoneal bleed and as indicated under the procedure section, was negative.  His hemoglobin, when the bleeding first began, was approximately 11.3 and dropped to a low of 9.0.  He received two units of packed red blood cells during his hospitalization.  On the morning of discharge, he felt fine and was asymptomatic with stable blood pressure.  DISCHARGE LABORATORY DATA AND X-RAY FINDINGS:  Sodium 138, potassium 3.9, chloride 105, bicarb 27, BUN 11, creatinine 1.2, glucose 117, hemoglobin 10.6, platelets 171. DD:  05/25/01  TD:  05/25/01 Job: [id number]

## 2011-05-13 NOTE — Cardiovascular Report (Signed)
NAME:  Andrew [name], Andrew [name]                        ACCOUNT NO.:  [id number]   MEDICAL RECORD NO.:  [id number]                   PATIENT TYPE:  OIB   LOCATION:  6522                                 FACILITY:  [facility]   PHYSICIAN:  Ethelle Lyon, M.D.             DATE OF BIRTH:  04/06/1928   DATE OF PROCEDURE:  01/21/2004  DATE OF DISCHARGE:  01/22/2004                              CARDIAC CATHETERIZATION   PROCEDURE:  Left heart catheterization, left ventriculography, coronary and  saphenous vein graft angiography, right subclavian angiography, drug-eluding  stent placement in the right posterior descending artery.   INDICATIONS:  Mr. Erdos is a 77 year old gentleman status post coronary  artery bypass grafting in 1992.  He is status post TAXUS stenting of the mid  PDA in May 2002.  He has generally done well.  However, he gets minimal  exercise.  He recently underwent exercise tolerance test.  This demonstrated  a predominantly fixed anterolateral defect with peri-infarct ischemia and  reversible inferior defect.  He was, therefore, referred for cardiac  catheterization and consideration of percutaneous revascularization.   PROCEDURAL TECHNIQUE:  Informed consent was obtained.  Under 1% lidocaine  local anesthesia, a 6-French sheath was placed in the right femoral artery.  Diagnostic angiography was performed using JR4 catheter for the native right  coronary and saphenous vein graft to the PLD.  The JL4 catheter was then  used to select the innominate artery.  Angiography was performed by hand  injection.  The catheter was then advanced over a wire into the right  subclavian artery.  Subclavian angiography to assess the in situ RIMA to PDA  was performed by hand injection.  This demonstrated the Sanford to be ectatic  so it was not selectively engaged.  The left main coronary artery arises  from the right cusp and is just superior and anterior to the takeoff of the  right coronary  artery with very nearly a common ostium.  This was eventually  selectively engaged using a Williams right catheter.  Ventriculography was  then performed by power injection.   Diagnostic images demonstrated occlusion of the RIMA to the PDA.  The  previously placed mid PDA stent was widely patent.  However, there was 90%  stenosis of the ostium of the right PDA.  Decision was made to treat this  percutaneously.  Anticoagulation was initiated with heparin and eptifibatide  to achieve and maintain an ACT of greater than 200 seconds.  Plavix 300 mg  was administered.  A 6-French JR4 guide was advanced over a wire and engaged  in the ostium of the right coronary artery.  A Luge wire was advanced  without difficulty into the distal portion of the PDA.  The lesion was  predilated using a 3 x 9-mm Maverick at 6 atm.  The lesion yielded readily.  Attempt was then made to place a 3.5 x 8-mm CYPHER.  However, I was  unable  to push the stent beyond the very sharp angle into the ostium of the PDA.  Therefore, an over-the-wire balloon catheter was advanced up the Luge wire  using a magnet.  The wire was removed and replaced with a Mailman wire.  The  balloon was then removed.  With this stiffer wire, I was able to manipulate  the 3.5 x 8-mm CYPHER into place.  It was deployed at 16 atm.  A 3.5 x 8-mm  Quantum was then used to postdilate the proximal portion of the stent at 16  atm.  The balloon was positioned so as to overhang the proximal margin of  the stent out into the very large RCA where the 3.5 balloon would not touch  the walls.  Final angiography demonstrated no residual stenosis and TIMI-3  flow to the distal vasculature.   Hand injection via the right femoral artery sheath was then performed to  assess the options for closure of the arteriotomy.  This demonstrated  occlusion of the right SFA proximally with the sheath entering at the  bifurcation.  The patient had no leg pain, a warm foot,  and no change in his  pulses from prior to the procedure.  He describes a longstanding history of  right calf claudication.  It was, therefore, supposed that this was a  chronic occlusion.  Plan was made to remove the sheath with manual  compression.  The patient was transferred to the holding room in stable  condition, having tolerated the procedure well.   COMPLICATIONS:  None.   FINDINGS:  1. LV:  EF 53% with mid anterior hypokinesis.  2. No aortic stenosis or mitral regurgitation.  3. Occluded right superficial femoral artery  4. RCA:  The RCA is a very large superdominant artery.  The PDA wraps     around the apex of the heart to supply at least the distal third of the     anterior wall and the entirety of the apex.  The proximal and mid vessel     are ectatic without significant stenosis.  The very large PDA had a 90%     stenosis at its ostium treated with drug-eluding stent as described     above.  The previously placed drug-eluding stent in the midportion of the     PDA is widely patent.  The RIMA graft to the PDA is occluded.  The very     large posterior left ventricular branch is occluded at its origin from     the distal RCA.  There was a sequential saphenous vein graft to 2     branches of this PLV.  The second limb of this graft is newly occluded.     There is an 80% stenosis of the proximal portion of the first     posterolateral branch which may inhibit retrograde filling of PL2 with     flow from the vein graft.  However, this is a 2-mm vessel though a     relatively large territory.  5. Left main:  The left main arises from the right coronary cusp as     described in detail above.  There is an atrial branch arising very early.     This atrial branch has an 80% proximal stenosis.  The left main then     continues.  There is no significant stenosis.  6. LAD:  The LAD is a small vessel which supplies only approximately half    the anterior  wall with the rest being  supplied by the right PDA.  There     is no significant disease.  7. Circumflex:  A relatively small vessel giving rise to a single obtuse     marginal.  There is no significant disease.   IMPRESSION/RECOMMENDATIONS:  Successful drug-eluding stent placement in the  severe stenosis of the posterior descending artery ostium.  Will plan on a  trial of medical therapy for the significant posterior left ventricle  lesion.  Its small size would be accompanied by a high restenosis rate.  It  is technically amenable to percutaneous coronary intervention should his  symptoms warrant.  I will treat him with Plavix for 1 year.  Aspirin will be  continued indefinitely.                                               Ethelle Lyon, M.D.    WED/MEDQ  D:  01/22/2004  T:  01/22/2004  Job:  [id number]   cc:   Thomas C. Wall, M.D.   Shanna Cisco., M.D.  [address] [place]  [place] [address]  Fax: [phone]

## 2011-05-13 NOTE — Op Note (Signed)
NAME:  Andrew [name], Andrew [name]                        ACCOUNT NO.:  [id number]   MEDICAL RECORD NO.:  [id number]                   PATIENT TYPE:  INP   LOCATION:  0101                                 FACILITY:  [facility]   PHYSICIAN:  [name] L. Rendall III, M.D.           DATE OF BIRTH:  08/19/1928   DATE OF PROCEDURE:  09/07/2003  DATE OF DISCHARGE:                                 OPERATIVE REPORT   PREOPERATIVE DIAGNOSIS:  Bilateral quadriceps tendon avulsion from the  patella.   OPERATION/PROCEDURE:  Bilateral repair of quadriceps tendon to the patella.   POSTOPERATIVE DIAGNOSIS:  Bilateral quadriceps tendon avulsion from the  patella.   SURGEON:  [name] L. Rendall, M.D.   ASSISTANTHarlin Heys.   ANESTHESIA:  General.   PATHOLOGY:  The patient fell earlier today, carrying a laundry basket,  landed on both knee caps and subsequently was unable to extend either knee.  The left knee had minimal swelling and a palpable defect.  The right knee  had a very swollen knee but absolutely no ability to straighten the leg and  to palpation when the knee was flexed, there was a suggestion of a defect.  At surgery, nearly identical avulsions were found.   DESCRIPTION OF PROCEDURE:  Under general anesthesia, both legs were prepared  with DuraPrep and draped as a sterile field.  The right leg was done first.  It was wrapped out with an Esmarch and tourniquet used to 300 mmHg.  Midline  incision approximately five inches in length was made over the center of the  patella up to the inferior quad tendon.  Dissection down through skin and  subcutaneous tissue evacuated a large hematoma clot.  This was washed out  with saline.  The proximal end of the patella was then freshened, removing  an overhanging bone ledge with bone cutter and rongeur.  The bone at the  proximal end of the patella was then freshened with the rongeur and a Key  elevator.  The quad tendon was then exposed.  It was divided for repair  purposes in half by repairing first one-half with a #2 fiber wire in a  modified Tajima stitch up into the tendon, placing it down through two drill  holes into the patella.  The other half of the quad tendon was then done  similarly.  Both were then pulled together and tied sequentially, repairing  the quad tendon to the patella.  Medial and lateral capsular and retinacular  avulsions were then repaired with #1 Ticron interrupted mattress sutures.  Once this was completed, subcutaneous tissue was closed with 0 Vicryl and 2-  0 Vicryl and skin clips.   Attention was then turned to the other knee where virtually identical  procedure was carried out.  At the end of repairing the second tendon, and  closing the wound, both knees were infiltrated with Marcaine 0.25% with  epinephrine, 20 mL each.  Sterile dressing and compression bandage with knee  immobilizer were applied to both knees.  Operative time - one hour for both  knees total.      JLR/MEDQ  D:  09/07/2003  T:  09/07/2003  Job:  [id number]

## 2011-05-13 NOTE — Cardiovascular Report (Signed)
[place]. [facility]  Patient:    Andrew [name], Andrew [name]                     MRN: [id number] Proc. Date: 05/22/01 Adm. Date:  [id number] Attending:  Fatima Sanger CC:         Allene Dillon, M.D. [facility] C. Wall, M.D. [facility]  Cath Lab   Cardiac Catheterization  INDICATIONS:  Mr. Scibelli is 77 years old and had bypass surgery 10 years ago and recently was admitted with unstable angina.  He was studied by Dr. Vicenta Aly and found to have diffuse ectasia in the native right coronary artery with a 95% stenosis in the posterior descending branch which was not grafted.  He had a patent vein graft to the two big posterolateral branches of the right coronary artery.  His left coronary system arose anomalacy from the right coronary cusp and consisted of a small circumflex artery and a small LAD which had a 70% stenosis.  His LV function was good.  We made a decision to proceed with intervention on the posterior descending branch of the right coronary artery.  The patient was done on Friday and because of the potential difficult nature of the procedure, he was scheduled for intervention this week and went home and came back as an outpatient.  DESCRIPTION OF PROCEDURE:  The procedure was performed via the right femoral artery using arterial sheath and 7 French right saphenuos vein graft bypass graft guiding catheter.  The patient was given weight-adjusted heparin for an ACT greater than 200 seconds and was given double bolus Integrilin infusion. He received aspirin and was on Plavix.  He was enrolled in the Taxus Trial. We were able to cross the lesion in the posterior descending branch of the right coronary artery with a long Luge wire without too much difficulty.  We initially predilated with a 3.25 x 15 mm Quantum Ranger, performing two inflations of 8 and 12 atmospheres for 40 and 37 seconds.  We then chose a 3.5 x 16 mm stent and received an Express stent  which was either coated or uncoated according to the Taxis randomization.  We deployed the stent with one inflation at 14 atmospheres for 47 seconds.  We then postdilated witha  3.75 x 12 mm Quantum Ranger performing one inflation of 19 atmospheres for 41 seconds.  Repeat diagnostic study was then performed through the guiding catheter.  The patient tolerated the procedure well and left the laboratory in satisfactory condition.  RESULTS:  Initially the stenosis in the posterior descending branch was estimated at 90 to 95%.  Following stenting, this improved to 0%.  There was no dissection seen.  CONCLUSION:  Successful stenting of the posterior descending branch of the right coronary artery with improvement in percent diameter narrowing from 95% to 0% (Taxus IV Trial).  DISPOSITION:  The patient was brought to the recovery room for further observation. DD:  05/22/01 TD:  05/22/01 Job: [id number]

## 2011-05-13 NOTE — Assessment & Plan Note (Signed)
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  He had one a number of years  ago.  His father had colon cancer and he thinks a brother had colon  cancer.  He has had some small polyps in the past himself but nothing of  any significance.  His last colonoscopy was in 1990 and he had two small  polyps and some mild diverticular disease.  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  I would assume Dr. Joni Fears advised that he  have this procedure done.   PAST MEDICAL HISTORY:  Reveals that he has had cardiac surgery, bypass  with stents, hemorrhoid surgery, and skin cancer.  He has also had some  eye surgery, aortic aneurysm, and knee surgery.   REVIEW OF SYSTEMS:  Reveals he has some swelling of feet sometimes,  muscle cramps and back pain at times.   PHYSICAL EXAMINATION:  He is slightly overweight, being 5 feet 7 inches  and weighs 215.  Blood pressure 124/60, pulse 78 and regular.  Neck,  heart, and extremities are all unremarkable.   IMPRESSION:  1. Status post colon polyps with a family history of colon cancer.  2. Mild obesity.  3. Dyslipidemia.  4. Hypertension.  5. Aortic aneurysm surgery.  6. Arteriosclerotic cardiovascular disease status post stents.  7. History of gastroesophageal reflux disease.  8. Right lower extremity deep venous thrombosis, patient on Coumadin.   Medications include Lovenox, Coumadin, Diovan, Prozac, Lasix, Toprol-XL,  Xanax, Crestor, vitamins, aspirin, NitroQuick, potassium, Restoril,  glucosamine, Aleve, and so on.   RECOMMENDATIONS:  Discussed with him per colonoscopic examination  sometime in the near future and because at  the end of our exam he  mentioned that he had some right-sided abdominal pain that was kind of  worrisome to him, it was kind of sharp, and has been going on for  several months, I thought that he certainly deserved to have a CT of his  abdomen to be sure there was nothing here significant.  I worry about  maybe some underlying occult lesions versus  maybe a kidney stone.  Hopefully the CT will be helpful to [place], and we  will also schedule  him for the upper endoscopy and keep him on the  above medications he is presently on.     Clarene Reamer, MD  Electronically Signed    SML/MedQ  DD: 01/04/2007  DT: 01/04/2007  Job #: [id number]   cc:   Shanna Cisco., MD

## 2011-05-13 NOTE — Cardiovascular Report (Signed)
[place]. [facility]  Patient:    JAESHAWN, DEMSKI                     MRN: [id number] Proc. Date: 05/18/01 Adm. Date:  [id number] Attending:  Allene Dillon CC:         Marijo Conception. Wall, M.D. [facility]  Cardiac Catheterization Laboratory   Cardiac Catheterization  PROCEDURES PERFORMED:  Left heart catheterization with coronary angiography, bypass graft angiography, and abdominal aortography.  INDICATIONS:  Mr. Fossett is a 77 year old male who is status post three-vessel coronary artery bypass surgery in 1992.  In 1997, cardiac catheterization showed a patent sequential vein graft to the first and second posterolateral branches and an occluded right internal mammary artery graft. He has been asymptomatic, but a recent Cardiolite showed significant ischemia of the inferior wall with mild anterolateral ischemia.  He also has a history of abdominal aortic aneurysm and is status post aneurysm repair.  Because of the significant ischemia on his Cardiolite, he was referred for cardiac catheterization.  PROCEDURAL NOTE:  A 6-French sheath was placed in the right femoral artery. Coronary angiography and bypass graft angiography was performed with a 6-French JR-4 catheter.  Left ventriculography was performed with an angled pigtail.  Contrast was Omnipaque.  There were no complications.  RESULTS:  HEMODYNAMICS:  Left ventricular pressure 136/18, aortic pressure 136/76. There was no aortic valve gradient.  LEFT VENTRICULOGRAM:  Wall motion is normal.  Ejection fraction is calculated at 58%.  There is no mitral regurgitation.  ABDOMINAL AORTOGRAM:  There is an intact abdominal aortic graft, status post abdominal aortic aneurysm repair.  It is difficult to delineate the renal arteries, but they do appear patent.  There is significant tortuosity of the left common iliac artery.  The right common iliac artery has a 40% stenosis proximally.  CORONARY ARTERIOGRAPHY  (RIGHT DOMINANT): The left main has an aberrant origin from the right coronary cusp.  The left main is very short in nature and of relatively small caliber.  It gives rise to a diminutive LAD, which supplies only the proximal portion of the anterior septum.  There is also a left circumflex arising from the left main which gives off some atrial branches and then two small obtuse marginal branches. In the proximal portion of the LAD, shortly after its origin from the left main, is a 70% stenosis.  The right coronary artery is a superdominant vessel supplying a large posterior descending artery and two relatively large posterolateral branches which supply the majority of the inferolateral wall.  The posterior descending artery curls the apex and supplies the distal anterior wall.  The right coronary artery has diffuse severe ectasia throughout the proximal mid and distal vessel.  In the mid vessel is a 60% stenosis and in the distal vessel a 30% stenosis.  Beyond the posterior descending artery in the AV groove portion of the right coronary artery there is a 95% stenosis followed by diffuse 80% stenosis.  The distal posterolateral is filled via saphenous vein graft.  The posterior descending artery itself is large in nature, as described above and has a 60% stenosis at its origin, followed by a relatively sharp bend, followed by a 95% stenosis in the mid body.  This is new in comparison with the last catheterization.  There is a sequential saphenous vein graft to the first and second posterolateral branches.  This is widely patent throughout its course.  The first posterolateral branch has a 40%  stenosis distally.  The second posterolateral branch has a 30% stenosis just after anastomosis of the vein graft, followed by a 40% stenosis in the mid to distal vessel.  The second posterolateral branch is 100% occluded proximally above the vein graft insertion.  Right internal mammary artery to  the posterior descending artery is 100% occluded in the proximal portion.  IMPRESSIONS: 1. Functional three-vessel coronary artery disease, as described, with a    superdominant right coronary artery and aberrant origin of the left    coronary arteries from the right coronary artery cusp.  There is severe    diffuse ectasia throughout the length of the right coronary artery.    There is a new critical lesion in the mid posterior descending artery. 2. Patent sequential graft to the first and second posterolateral branches    with an occluded right internal mammary to the posterior descending artery.  PLAN:  The patients anatomy presents a very difficult challenge.  Options at this point include high-risk percutaneous intervention versus redo single-vessel CABG versus medical therapy.  These findings will be reviewed with my colleagues with further recommendations to be made. DD:  05/18/01 TD:  05/18/01 Job: [id number]

## 2011-05-13 NOTE — Discharge Summary (Signed)
NAME:  Andrew [name], Andrew [name]                        ACCOUNT NO.:  [id number]   MEDICAL RECORD NO.:  [id number]                   PATIENT TYPE:  IPS   LOCATION:  [id number]                                 FACILITY:  [facility]   PHYSICIAN:  Meredith Staggers, M.D.             DATE OF BIRTH:  1928/03/26   DATE OF ADMISSION:  09/11/2003  DATE OF DISCHARGE:  09/18/2003                                 DISCHARGE SUMMARY   DISCHARGE DIAGNOSES:  1. Bilateral quadriceps tendon rupture with evulsion fracture status post     repair.  2. Hypertension.  3. Urinary retention, resolved.   HISTORY OF PRESENT ILLNESS:  This is a 78 year old male who tripped and fell  on September 07, 2003 sustaining bilateral quadriceps tendon tears with  evulsion fracture of the patellae.  He was evaluated by Dr. Telford Nab and  underwent tendon repair the same day.  He is currently weightbearing as  tolerated, knee immobilizers in place, and no range of motion of the knees  to be done.  He has had problems with hypoxia past surgery as well as  urinary retention.  Follow-up chest x-rays have shown no acute changes.  He  was started on Urecholine to help his voiding function.  Physical therapy  also initiated and the patient found to be total assist plus to 50% to  transfer, close supervision to ambulate 75% with a rolling walker.   PAST MEDICAL HISTORY:  1. Coronary artery disease status post CABG.  2. Hypertension.  3. Dyslipidemia.  4. Depression.  5. AAA repair.  6. Insomnia.  7. Frequency with nocturia.   ALLERGIES:  No known drug allergies.   SOCIAL HISTORY:  The patient is married, lives in a split-level home.  Was  independent and active prior to admission.  His wife has had surgery three  weeks prior to his admission.  The patient does not use any tobacco or  alcohol.   HOSPITAL COURSE:  [name] was admitted to rehab on September 11, 2003 for inpatient therapies to consist of PT/OT daily.  Past  admission he  was maintained on subcu Lovenox for DVT prophylaxis.  His knee incisions  were noted to be intact with some effusion bilaterally as well as some  tibial edema.  Labs done past admission showed hemoglobin 8.7, hematocrit  25.0, white count 6.3, platelets 203.  Sodium 138, potassium 3.9, chloride  104, CO2 29, BUN 30, creatinine 1.1, glucose 132.  The patient was noted to  have some problems with urinary retention initially with PVRs in the 120s to  200 mL.  He was started on Flomax and has been tapered off Urecholine by  time of discharge.  The patient is currently voiding without difficulty.  UA/UCS done was negative showing no growth.  The patient was maintained on  iron supplement for his postoperative anemia.  Follow-up H&H shows some  improvement with hemoglobin at 9.3,  hematocrit 26.6.  The patient's  incisions remain intact.  Staples were discontinued and Steri-Strips applied  to area on postoperative day #11 prior to discharge.  During his stay in  rehab [name] has progressed to being modified independent for ADLs with  assist for his donning and doffing of knee immobilizers.  He is modified  independent for transfers, modified independent for ambulating short  distances with the rolling walker.  Further follow-up therapies to include  home health PT/OT by [name].  Semi-electric hospital bed also  delivered to help with ease of transfers in and out of bed.  On September 18, 2003 the patient is discharged to home.   DISCHARGE MEDICATIONS:  1. Toprol-XL 50 mg a day.  2. Flomax 0.4 mg a day.  3. Lexapro 20 mg a day.  4. Diovan 80 mg a day.  5. Crestor 20 mg q.h.s.  6. Procardia XL 30 mg a day.  7. Trinsicon one p.o. b.i.d.  8. Senokot one to two q.h.s. p.r.n.  9. Oxycodone IR 5-10 mg q.4-6h. p.r.n. pain.   ACTIVITY:  Wear bilateral knee immobilizer at all times.  No flexion of  knees.   WOUND CARE:  Keep area clean and dry.   SPECIAL  INSTRUCTIONS:  1. No alcohol, no smoking, no driving.  2. [facility] to provide PT/OT and aide.   FOLLOW-UP:  The patient to follow up with Dr. Telford Nab for an appointment in  two to three weeks.  Follow up with Dr. Joni Fears in six to eight weeks.  Follow up with Dr. Naaman Plummer as needed.      Thornton Dales, P.A.                    Meredith Staggers, M.D.    PP/MEDQ  D:  09/18/2003  T:  09/20/2003  Job:  [id number]   cc:   Shanna Cisco., M.D.  [address] [place]  [place] [address]  Fax: [place] Rendall III, M.D.  [place] [place]  [place] [address]  Fax: [phone]

## 2011-05-13 NOTE — Discharge Summary (Signed)
NAME:  DAETON, LEFEVRE                        ACCOUNT NO.:  [id number]   MEDICAL RECORD NO.:  [id number]                   PATIENT TYPE:  OIB   LOCATION:  [id number]                                 FACILITY:  [facility]   PHYSICIAN:  Keslie Gritz Dictator                    DATE OF BIRTH:  01-Nov-1928   DATE OF ADMISSION:  01/21/2004  DATE OF DISCHARGE:  01/22/2004                           DISCHARGE SUMMARY - REFERRING   SUMMARY OF HISTORY:  Mr. Schuth is a 77 year old male usually followed by  Dr. Verl Blalock.  He was seen in the office on January 14, 2004.  The patient has  done well with his last Cardiolite in January of 2004 showing no ischemia.  However, in November of 2004, he was seen in the office with some complaints  of chest discomfort which were consistent with dyspepsia.  Improved symptom  on Protonix.  He was seen in followup with Dr. Verl Blalock on December 29, 2003, and  was set up for a screening Cardiolite which was performed on January 08, 2004.  The patient exercised for five minutes, achieving a workload to 6.3  mets and his heart rate responded to 92% of his maximum.  He denied any  chest discomfort and his EF was 51%.  However, his perfusion imaging  demonstrated a large perfusion defect involving the lateral and  anterolateral wall, which was partially reversible.  After reviewing the  findings, Dr. Verl Blalock felt that the patient should undergo cardiac  catheterization.   PAST MEDICAL HISTORY:  His history is notable for:  1. Known coronary artery disease, status post bypass grafting in 1992.  His     last catheterization in May of 2002 revealed a diminutive LAD supplying     the proximal portion of the anterior septum.  The circumflex rose from     the left main and gave off some atrial branches and two small obtuse     marginals.  There was a 70% stenosis in the proximal portion of the LAD.     The RCA was super dominant and had severe ectasia throughout the proximal     mid and distal  vessel.  There was a 60% mid RCA lesion and a 30% distal     RCA lesion.  Beyond the PDA and AV groove of the right coronary artery     there was a 95% stenosis followed by an 80% stenosis.  The distal PL     branch filled via the saphenous vein graft.  The PDA was large and had a     60% stenosis at the origin and 95% in the mid portion.  The sequential     vein grafts to the first and second PL branches were patent.  The right     internal mammary artery to the PDA was totally occluded, which was felt     to be an old finding.  He was enrolled in the TAXUS 4 trial and received     a TAXUS stent to the PDA, reducing the lesion to 0%.  2. Treated dyslipidemia.  3. Hypertension.  4. Status post abdominal aortic aneurysm repair with left renal artery graft     in 1997.  5. Left femoral artery bruit.  6. Status post bilateral knee surgery in September of 2004.  7. GERD.   ALLERGIES:  PRINIVIL causes a cough.   LABORATORY DATA:  The preadmission hemoglobin was 13.3, hematocrit 40.0,  normal indices, platelets 228, and WBC 7.0.  PT 14.4, INR 1.2, PTT 35.  Sodium 135, potassium 4.4, glucose 89, BUN 29, creatinine 1.3.  EKGs post  procedure showed normal sinus rhythm, normal intervals, left axis deviation,  left anterior fascicular block, nonspecific ST-T wave changes, and slight T-  wave inversion in 1 and aVL.  Post procedure and prior to discharge on  January 22, 2004, the hemoglobin was 11.9, hematocrit 34.1, normal indices,  platelets 196, and WBC 7.6.  Sodium 136, potassium 3.7, BUN 17, creatinine  1.0, glucose 86.  CK-MBs were within normal limits.   HISTORY AND PHYSICAL:  Mr. Valbuena was brought in for outpatient cardiac  catheterization.  This was performed on January 21, 2004, by Dr. Albertine Patricia.  According to Dr. Christy Sartorius note, he had a small LAD which did not reach the  apex.  He noted that the left coronary rose from the right cusp.  An atrial  branch off of the left main had an 80%  lesion.  The circumflex was intact.  The RCA was ectatic with a 100% PLA lesion, 80% proximal PLA, and 90%  proximal PDA.  The PDA stent was patent.  The RIMA to the PDA was occluded.  The left saphenous femoral artery was also occluded.  The EF was 53% with  mid anterior hypokinesis.  A drug-eluding Cypher stent was placed in PDA  ostium, reducing the 90% lesion to 0% without difficulty.  Dr. Albertine Patricia  recommended Plavix for at least one year and aspirin indefinitely.  Post  procedure, the patient was doing well.  Post bed rest, the patient was  ambulating without difficulty.  The catheterization site was intact.  After  review on January 22, 2004, Dr. Albertine Patricia felt that the patient could be  discharged home with outpatient followup with himself in regards to  claudication and peripheral vascular disease and with Dr. Verl Blalock for continue  cardiology followup.   DISCHARGE DIAGNOSES:  1. Positive stress Cardiolite.  2. Progressive coronary artery disease, status post drug-eluding Cypher     stent to the PDA as previously described.  3. History as previously described.   DISCHARGE MEDICATIONS:  He received new prescriptions for Plavix 75 mg daily  for at least one year and Pletal 100 mg p.o. b.i.d.  Dr. Albertine Patricia instructed  him that he may have some associated diarrhea and palpitations, but these  should resolve within less than a week.  He was asked to continue his  addition medications, which including coated aspirin 325 mg daily, Lasix 40  mg every other day, Xanax as previously, Diovan 80 mg daily, Toprol XL 50 mg  daily, Procardia 30 mg daily, Lexapro 20 mg daily, Foltx daily, Protonix 40  mg daily, Prozac 20 mg daily, Lipitor 20 mg q.h.s., nitroglycerin as needed,  multivitamins, glucosamine chondroitin, and potassium as previously.   ACTIVITY:  He was advised no lifting, driving, sexual activity, or heavy  exertion for two days.  DIET:  Maintain a low-salt, low-fat, low-cholesterol  diet.  WOUND CARE:  If he has any problems with his catheterization site, he was  asked to call the office.   FOLLOWUP:  The office will call him this afternoon at home with a followup  appointment with Dr. Verl Blalock for general cardiology management and with Dr.  Albertine Patricia for management of peripheral vascular disease.      Sharyl Nimrod, P.A. [facility]                    Elza Varricchio Dictator    EW/MEDQ  D:  01/22/2004  T:  01/22/2004  Job:  [id number]   cc:   Thomas C. Wall, M.D.   Shanna Cisco., M.D.  [address] [place]  [place] [address]  Fax: [phone]

## 2011-05-13 NOTE — Discharge Summary (Signed)
NAME:  Andrew [name], Andrew [name]                        ACCOUNT NO.:  [id number]   MEDICAL RECORD NO.:  [id number]                   PATIENT TYPE:  INP   LOCATION:  [place]                                 [facility]ACILITY:  [facility]   PHYSICIAN:  [name] L. Rendall, M.D.               DATE OF BIRTH:  03-08-1928   DATE OF ADMISSION:  09/07/2003  DATE OF DISCHARGE:  09/11/2003                                 DISCHARGE SUMMARY   ADMISSION DIAGNOSES:  1. Bilateral avulsion quadriceps tendon.  2. Coronary artery disease with history of myocardial infarction and     coronary artery bypass grafting.  3. Hypertension.  4. Hyperlipidemia.  5. Depression.   DISCHARGE DIAGNOSES:  1. Bilateral quadriceps tendon avulsion, status post open reduction and     internal fixation and repair.  2. Pyrexia.  3. Constipation.  4. Urinary retention.  5. Atelectasis.  6. Acute blood loss anemia secondary to surgery.  7. Coronary artery disease with history of myocardial infarction and     coronary artery bypass grafting.  8. Hypertension.  9. Hyperlipidemia.  10.      Depression.   SURGICAL PROCEDURE:  On September 07, 2003, [name] underwent open  reduction and internal fixation and repair of bilateral quadriceps tendon  avulsion by Dr. Jenny Reichmann L. Rendall, assisted by Erskine Emery, P.A.C.   COMPLICATIONS:  None.   CONSULTS:  1. Physical therapy and case management consult September 08, 2003.  2. Occupational therapy and rehabilitation medicine consult September 09, 2003.  3. Internal medicine consult by [facility] hospitalist September 10, 2003.   HISTORY OF PRESENT ILLNESS:  This 77 year old white male patient presented  to the ER with bilateral knee deformities after missing the last step or two  of a stairwell while carrying a laundry basket, and he fell, landing on both  knees.  He had swelling and pain since the fall, and he was unable to bear  weight and walk due to muscle weakness.  He was brought to the  ER where he  was found to have bilateral quadriceps tendon avulsion fractures.  He is  admitted for surgical fixation.   HOSPITAL COURSE:  [name] tolerated his surgical procedure well, without  immediate postoperative complications.  He was transferred to [facility] [address].  He  did have inability to void that night, and an I&O catheterization was done.  His t-max was 98.1, vital signs were stable.  Hemoglobin was 11, hematocrit  31.8.  Pain was well controlled.  He was weaned off oxygen, started on  therapy, and was to be assisted to stand to help with voiding.   On postoperative day #2 t-max was 103.7, vital signs were stable.  He had to  have his Foley placed during the night due to inability to void.  He was  started on Urecholine.  He did have some complaints of constipation, and  that was  treated with a laxative.  Chest x-ray showed possible increasing  left lower lobe atelectasis or infiltrate.  Aggressive pulmonary toilet was  started, and he was continued on O2.   He did well with therapy over the next day or two.  He still had an elevated  temperature and was unable to be weaned off oxygen, with saturations going  down to as low as 62%.  He did have a few crackles noted in his lungs.  Internal medicine consult was obtained, and they followed him throughout the  rest of his hospitalization.  Hemoglobin was 9.1, hematocrit 26.  He was  started on Rocephin on September 10, 2003, once medicine saw him and added  on albuterol nebulizers.   On postoperative day #4 he was doing well with therapy, but it was felt he  was probably not going to be doing well enough to go home.  A rehabilitation  bed was being searched for.  His O2 saturations had improved to 90-92% on 2  L of oxygen.  Repeat chest x-ray at that time was negative.  Hemoglobin was  9.4, hematocrit 26.9.  Knee incisions were both well approximated with  staples and minimal drainage.  Legs were neurovascularly intact.   Medicine  saw him at that time and thought he was fine to be switched to p.o.  antibiotics and transferred to rehabilitation.  He was transferred to  rehabilitation later that day.   DISCHARGE INSTRUCTIONS:  1. Diet:  He can continue his current hospitalization diet with adjustments     per the rehabilitation physicians.  2. Medications:  Continue current hospitalization medicines with adjustments     to be made per those physicians.  3. Activity:  He is to be out of bed, weightbearing as tolerated on both     knees with the knee immobilizers in place at all times.  He is to use the     walker.  PT and OT per rehabilitation protocols.  4. Wound care:  Please clean both knee incisions with Betadine daily and     apply a dry dressing.  Staples can be removed and Steri-Strips with     Benzoin applied on postoperative day #14 if he is still in rehabilitation     at that time.  Otherwise, he needs follow-up with Dr. Telford Nab at that     time.  Please notify Dr. Telford Nab if temperature greater than 101.5,     chills, pain unrelieved by pain medicines, or foul-smelling drainage from     the wound.  5. He needs to follow up with Dr. Telford Nab in our office at about     postoperative day #14 if his staples have not been removed while in     rehabilitation.  Otherwise, if staples have been removed in     rehabilitation, he can follow up with [place] one to two weeks after discharge     from rehabilitation.  He needs to follow up with his [place] primary care     doctor to follow up his anemia workup and possible sleep apnea.   LABORATORY DATA:  ABG done on September 10, 2003, showed a pH of 7.406, pCO2  of 41.2, pO2 of 56.5, bicarbonate of 25.3.   Hemoglobin ranged from a high of 12.8 with hematocrit of 36.8 on September  12, to a low of 9.1 hemoglobin and 26 hematocrit on September 10, 2003.  On September 11, 2003, white count was 7.8, hemoglobin 9.4, hematocrit 26.9,  and platelets 180.   On  September 07, 2003, PT was 13.4, PTT 43.   On September 08, 2003, glucose 103, calcium 7.8.  On September 09, 2003,  glucose was 122, calcium 8.   On September 10, 2003, iron was 15 mcg/dl, TIBC was 231, and percent  saturation was 6%.   Urinalysis on admission on September 07, 2003, showed cloudy yellow urine  with 100 mg/dl of protein, negative nitrite, leukocyte esterase, 0-2 white  cells, and 7-10 red cells.  Repeat UA on September 09, 2003, was negative  and normal.  All other laboratory studies were within normal limits.   Chest x-ray done on September 07, 2003, showed cardiomegaly without evidence  for acute pulmonary abnormality.  Chest x-ray done on September 09, 2003,  showed increase in left basilar opacity, atelectasis versus infiltrate, and  cardiomegaly.  Repeat chest x-ray done on September 10, 2003, showed no  active disease.  X-rays taken of both knees on September 07, 2003, showed on  the right knee a large joint effusion with possible occult fracture or  ligamentous injury, and follow-up films were recommended.  Left knee film  showed presence of a joint effusion which also raised suspicious for  fracture or ligamentous injury.  All other laboratory studies were within  normal limits.     Vonita Moss Duffy, P.A.                      [name] L. Telford Nab, M.D.    KED/MEDQ  D:  09/17/2003  T:  09/17/2003  Job:  [id number]

## 2011-05-16 ENCOUNTER — Other Ambulatory Visit: Payer: Self-pay | Admitting: Family Medicine

## 2011-06-14 ENCOUNTER — Other Ambulatory Visit: Payer: Self-pay | Admitting: Family Medicine

## 2011-06-27 ENCOUNTER — Telehealth: Payer: Self-pay | Admitting: Family Medicine

## 2011-06-27 NOTE — Telephone Encounter (Signed)
Pt called and is req to get a cortisone shot in back for severe back pain. Pt req work in appt asap.

## 2011-06-28 NOTE — Telephone Encounter (Signed)
Called and spoke with pt per Dr. Joni Fears.  Pt stated that he wanted an appointment on 06/30/2011.  Pt is aware that an rx has been set of 06/30/2011 at 3 pm.

## 2011-06-30 ENCOUNTER — Ambulatory Visit (INDEPENDENT_AMBULATORY_CARE_PROVIDER_SITE_OTHER): Payer: Medicare Other | Admitting: Family Medicine

## 2011-06-30 ENCOUNTER — Encounter: Payer: Self-pay | Admitting: Family Medicine

## 2011-06-30 VITALS — BP 124/64 | Temp 98.7°F | Wt 203.0 lb

## 2011-06-30 DIAGNOSIS — I10 Essential (primary) hypertension: Secondary | ICD-10-CM

## 2011-06-30 DIAGNOSIS — M129 Arthropathy, unspecified: Secondary | ICD-10-CM

## 2011-06-30 DIAGNOSIS — E119 Type 2 diabetes mellitus without complications: Secondary | ICD-10-CM

## 2011-06-30 DIAGNOSIS — S39012A Strain of muscle, fascia and tendon of lower back, initial encounter: Secondary | ICD-10-CM

## 2011-06-30 DIAGNOSIS — M1990 Unspecified osteoarthritis, unspecified site: Secondary | ICD-10-CM

## 2011-06-30 DIAGNOSIS — S336XXA Sprain of sacroiliac joint, initial encounter: Secondary | ICD-10-CM

## 2011-06-30 DIAGNOSIS — M62838 Other muscle spasm: Secondary | ICD-10-CM

## 2011-06-30 MED ORDER — METHYLPREDNISOLONE ACETATE 80 MG/ML IJ SUSP
160.0000 mg | Freq: Once | INTRAMUSCULAR | Status: AC
  Administered 2011-06-30: 160 mg via INTRAMUSCULAR

## 2011-07-01 LAB — HEMOGLOBIN A1C: Hgb A1c MFr Bld: 6.9 % — ABNORMAL HIGH (ref 4.6–6.5)

## 2011-07-20 ENCOUNTER — Encounter: Payer: Self-pay | Admitting: Podiatry

## 2011-07-26 NOTE — Progress Notes (Signed)
  Subjective:    Patient ID: Andrew [name], male    DOB: 08-20-1928, 77 y.o.   MRN: [id number] The patient is an 77 year old white male who is in today is a known diabetic complaining of low back pain over the past 2 weeks He has noted some muscle spasm along with the pain he relates his diabetes has been under good control 100 1:30 CBGs Hypertension has been well-controlled irritable bowel syndrome has been controlled Finished out problem with obesity weighing 203 HPI   Review of Systems see history of present illness patient does have carotid artery disease has had a bypass CABG Other symptoms on review     Objective:   Physical Exam the patient is a well-built well-nourished obese white male who appears to be in pain complaining of back pain on movement Heart examination reveals no acute changes and lung examination clear to palpation percussion and auscultation Examination of the low back reveals marked tenderness of both SI joints as well as moderate muscle spasm of the lumbar region no limitation straight leg raising              Assessment & Plan:  Acute lumbosacral strain especially sacroiliac strain with muscle spasm Treatment consists of Depo-Medrol 120 mg IM, Celebrex 200 mg twice a day as well as Flexeril 10 mg 3 times a day Diabetes mellitus to continue same treatment at this time however to get a hemoglobin A1c today Hypertension controlled Exogenous obesity recommend initiate and weight control with Weight [employer]

## 2011-07-26 NOTE — Patient Instructions (Signed)
Acute lumbo sacral strain and degerative arthritis, to treat with Depo-Medrol 120 mg I am as well as continue Celebrex 200 mg twice a day also continue Flexeril 10 mg 3 times a day  To get a hemoglobin A1c today

## 2011-08-30 ENCOUNTER — Encounter (INDEPENDENT_AMBULATORY_CARE_PROVIDER_SITE_OTHER): Payer: Medicare Other

## 2011-08-30 DIAGNOSIS — I739 Peripheral vascular disease, unspecified: Secondary | ICD-10-CM

## 2011-09-07 ENCOUNTER — Encounter: Payer: Self-pay | Admitting: Vascular Surgery

## 2011-09-15 ENCOUNTER — Other Ambulatory Visit: Payer: Self-pay | Admitting: Vascular Surgery

## 2011-09-15 DIAGNOSIS — Z48812 Encounter for surgical aftercare following surgery on the circulatory system: Secondary | ICD-10-CM

## 2011-09-15 DIAGNOSIS — I739 Peripheral vascular disease, unspecified: Secondary | ICD-10-CM

## 2011-09-15 DIAGNOSIS — I70219 Atherosclerosis of native arteries of extremities with intermittent claudication, unspecified extremity: Secondary | ICD-10-CM

## 2011-09-15 DIAGNOSIS — I714 Abdominal aortic aneurysm, without rupture: Secondary | ICD-10-CM

## 2011-09-15 DIAGNOSIS — I701 Atherosclerosis of renal artery: Secondary | ICD-10-CM

## 2011-09-29 LAB — BASIC METABOLIC PANEL
BUN: 12 mg/dL (ref 6–23)
BUN: 21 mg/dL (ref 6–23)
Calcium: 8.6 mg/dL (ref 8.4–10.5)
Calcium: 9.4 mg/dL (ref 8.4–10.5)
Creatinine, Ser: 0.96 mg/dL (ref 0.4–1.5)
Creatinine, Ser: 1.44 mg/dL (ref 0.4–1.5)
GFR calc Af Amer: >60 mL/min (ref >60)
GFR calc non Af Amer: 47 mL/min — ABNORMAL LOW (ref >60)
GFR calc non Af Amer: >60 mL/min (ref >60)
Glucose, Bld: 82 mg/dL (ref 70–99)

## 2011-09-29 LAB — CARDIAC PANEL(CRET KIN+CKTOT+MB+TROPI)
Relative Index: 2.4 (ref 0.0–2.5)
Relative Index: 2.8 — ABNORMAL HIGH (ref 0.0–2.5)
Total CK: 128 U/L (ref 7–232)
Total CK: 131 U/L (ref 7–232)
Troponin I: 0.01 ng/mL (ref 0.00–0.06)
Troponin I: 0.03 ng/mL (ref 0.00–0.06)
Troponin I: 0.2 ng/mL — ABNORMAL HIGH (ref 0.00–0.06)

## 2011-09-29 LAB — GLUCOSE, CAPILLARY
Glucose-Capillary: 123 mg/dL — ABNORMAL HIGH (ref 70–99)
Glucose-Capillary: 123 mg/dL — ABNORMAL HIGH (ref 70–99)
Glucose-Capillary: 124 mg/dL — ABNORMAL HIGH (ref 70–99)
Glucose-Capillary: 187 mg/dL — ABNORMAL HIGH (ref 70–99)
Glucose-Capillary: 94 mg/dL (ref 70–99)

## 2011-09-29 LAB — DIFFERENTIAL
Basophils Absolute: 0 10*3/uL (ref 0.0–0.1)
Eosinophils Absolute: 0.2 10*3/uL (ref 0.0–0.7)
Eosinophils Relative: 3 % (ref 0–5)
Lymphocytes Relative: 26 % (ref 12–46)
Monocytes Absolute: 0.8 10*3/uL (ref 0.1–1.0)

## 2011-09-29 LAB — MAGNESIUM: Magnesium: 2 mg/dL (ref 1.5–2.5)

## 2011-09-29 LAB — CBC
HCT: 37.3 % — ABNORMAL LOW (ref 39.0–52.0)
Hemoglobin: 12.7 g/dL — ABNORMAL LOW (ref 13.0–17.0)
MCV: 96.3 fL (ref 78.0–100.0)
Platelets: 162 10*3/uL (ref 150–400)
Platelets: 175 10*3/uL (ref 150–400)
RBC: 3.97 MIL/uL — ABNORMAL LOW (ref 4.22–5.81)
RDW: 12.2 % (ref 11.5–15.5)
WBC: 8.4 10*3/uL (ref 4.0–10.5)

## 2011-10-07 ENCOUNTER — Other Ambulatory Visit: Payer: Self-pay | Admitting: Family Medicine

## 2011-10-12 ENCOUNTER — Other Ambulatory Visit: Payer: Self-pay

## 2011-10-12 MED ORDER — CLOTRIMAZOLE-BETAMETHASONE 1-0.05 % EX CREA: TOPICAL_CREAM | Freq: Two times a day (BID) | CUTANEOUS | Status: DC

## 2011-10-12 NOTE — Telephone Encounter (Signed)
Ok per Dr. Joni Fears to fill Lotrisone cream.

## 2011-11-22 ENCOUNTER — Other Ambulatory Visit: Payer: Self-pay | Admitting: Family Medicine

## 2011-11-23 ENCOUNTER — Telehealth: Payer: Self-pay | Admitting: Family Medicine

## 2011-11-23 NOTE — Telephone Encounter (Signed)
Called to check on status of faxed req for DME for vac system that was sent on 11/21/11.

## 2011-11-24 NOTE — Telephone Encounter (Signed)
Fax received waiting on approval.

## 2011-12-02 ENCOUNTER — Telehealth: Payer: Self-pay | Admitting: Family Medicine

## 2011-12-02 NOTE — Telephone Encounter (Signed)
Mali contacted on behalf of the pt regarding Vac Pump approval. Requesting call back @ [phone] ex [phone]

## 2011-12-05 NOTE — Telephone Encounter (Signed)
Paperwork faxed back on 12/02/11.

## 2012-01-02 ENCOUNTER — Encounter: Payer: Self-pay | Admitting: Family

## 2012-01-02 ENCOUNTER — Ambulatory Visit (INDEPENDENT_AMBULATORY_CARE_PROVIDER_SITE_OTHER): Payer: Medicare Other | Admitting: Family

## 2012-01-02 VITALS — BP 116/60 | Temp 98.2°F | Wt 204.0 lb

## 2012-01-02 DIAGNOSIS — H9190 Unspecified hearing loss, unspecified ear: Secondary | ICD-10-CM

## 2012-01-02 DIAGNOSIS — H93239 Hyperacusis, unspecified ear: Secondary | ICD-10-CM

## 2012-01-02 DIAGNOSIS — H6120 Impacted cerumen, unspecified ear: Secondary | ICD-10-CM

## 2012-01-02 NOTE — Progress Notes (Signed)
  Subjective:    Patient ID: Andrew [name], male    DOB: 1928/04/28, 78 y.o.   MRN: [id number]  HPI 78 year old white male, former smoker, patient of Dr. Joni Fears in today with one week of decreased hearing in his left ear. He believes he may have a cerumen impaction. He has a history of cerumen impactions. He denies any sneezing, coughing, or congestion.   Review of Systems  Constitutional: Negative.   HENT: Positive for hearing loss.   Eyes: Negative.   Respiratory: Negative.   Cardiovascular: Negative.   Musculoskeletal: Negative.   Skin: Negative.   Neurological: Negative.   Psychiatric/Behavioral: Negative.        Objective:   Physical Exam  Constitutional: He is oriented to person, place, and time. He appears well-developed and well-nourished.  HENT:  Right Ear: External ear normal.  Nose: Nose normal.  Mouth/Throat: Oropharynx is clear and moist.       Left cerumen impaction  Neck: Normal range of motion. Neck supple.  Cardiovascular: Normal rate, regular rhythm and normal heart sounds.   Pulmonary/Chest: Effort normal and breath sounds normal.  Musculoskeletal: Normal range of motion.  Neurological: He is alert and oriented to person, place, and time.  Skin: Skin is warm and dry.  Psychiatric: He has a normal mood and affect.     Informed consent was obtained and peroxide gel was inserted into the left ear using the lavage kit the ear were lavaged until clean.Inspection with a cerumen spoon removed residual wax. Patient tolerated the procedure well.     Assessment & Plan:  Assessment: Acute hearing loss, cerumen impaction  Plan: Patient here was lavaged with great results. Over-the-counter Debrox advised for future cerumen impactions. Recheck with PCP as scheduled on the 21st and sooner when necessary.

## 2012-01-02 NOTE — Patient Instructions (Signed)
Cerumen Impaction A cerumen impaction is when the wax in your ear forms a plug. This plug usually causes reduced hearing. Sometimes it also causes an earache or dizziness. Removing a cerumen impaction can be difficult and painful. The wax sticks to the ear canal. The canal is sensitive and bleeds easily. If you try to remove a heavy wax buildup with a cotton tipped swab, you may push it in further. Irrigation with water, suction, and small ear curettes may be used to clear out the wax. If the impaction is fixed to the skin in the ear canal, ear drops may be needed for a few days to loosen the wax. People who build up a lot of wax frequently can use ear wax removal products available in your local drugstore. SEEK MEDICAL CARE IF:  You develop an earache, increased hearing loss, or marked dizziness. Document Released: 01/19/2005 Document Revised: 08/24/2011 Document Reviewed: 03/11/2010 [facility] Patient Information 2012 [place].

## 2012-01-16 ENCOUNTER — Other Ambulatory Visit (INDEPENDENT_AMBULATORY_CARE_PROVIDER_SITE_OTHER): Payer: Medicare Other

## 2012-01-16 ENCOUNTER — Ambulatory Visit (INDEPENDENT_AMBULATORY_CARE_PROVIDER_SITE_OTHER): Payer: Medicare Other | Admitting: Internal Medicine

## 2012-01-16 ENCOUNTER — Encounter: Payer: Self-pay | Admitting: Internal Medicine

## 2012-01-16 DIAGNOSIS — E039 Hypothyroidism, unspecified: Secondary | ICD-10-CM

## 2012-01-16 DIAGNOSIS — E785 Hyperlipidemia, unspecified: Secondary | ICD-10-CM

## 2012-01-16 DIAGNOSIS — Z23 Encounter for immunization: Secondary | ICD-10-CM

## 2012-01-16 DIAGNOSIS — R609 Edema, unspecified: Secondary | ICD-10-CM

## 2012-01-16 DIAGNOSIS — E559 Vitamin D deficiency, unspecified: Secondary | ICD-10-CM

## 2012-01-16 DIAGNOSIS — I10 Essential (primary) hypertension: Secondary | ICD-10-CM

## 2012-01-16 LAB — CBC WITH DIFFERENTIAL/PLATELET
Basophils Absolute: 0 10*3/uL (ref 0.0–0.1)
HCT: 34.9 % — ABNORMAL LOW (ref 39.0–52.0)
Hemoglobin: 12 g/dL — ABNORMAL LOW (ref 13.0–17.0)
Lymphs Abs: 1.2 10*3/uL (ref 0.7–4.0)
MCHC: 34.4 g/dL (ref 30.0–36.0)
MCV: 96 fl (ref 78.0–100.0)
Monocytes Absolute: 0.5 10*3/uL (ref 0.1–1.0)
Monocytes Relative: 10.5 % (ref 3.0–12.0)
Neutro Abs: 3.1 10*3/uL (ref 1.4–7.7)
RDW: 13.2 % (ref 11.5–14.6)

## 2012-01-16 LAB — LIPID PANEL: VLDL: 9.8 mg/dL (ref 0.0–40.0)

## 2012-01-16 LAB — HEMOGLOBIN A1C: Hgb A1c MFr Bld: 6 % (ref 4.6–6.5)

## 2012-01-16 LAB — URINALYSIS, ROUTINE W REFLEX MICROSCOPIC
Bilirubin Urine: NEGATIVE
Ketones, ur: NEGATIVE
Total Protein, Urine: NEGATIVE
pH: 5.5 (ref 5.0–8.0)

## 2012-01-16 LAB — COMPREHENSIVE METABOLIC PANEL
AST: 24 U/L (ref 0–37)
Albumin: 4 g/dL (ref 3.5–5.2)
Alkaline Phosphatase: 24 U/L — ABNORMAL LOW (ref 39–117)
Chloride: 105 mEq/L (ref 96–112)
Glucose, Bld: 113 mg/dL — ABNORMAL HIGH (ref 70–99)
Potassium: 4.2 mEq/L (ref 3.5–5.1)
Sodium: 141 mEq/L (ref 135–145)
Total Protein: 6.8 g/dL (ref 6.0–8.3)

## 2012-01-16 LAB — TSH: TSH: 2.81 u[IU]/mL (ref 0.35–5.50)

## 2012-01-16 NOTE — Assessment & Plan Note (Signed)
It sounds like his blood sugars are well controlled, I will check his [id number] today to be certain and I will also check his renal function

## 2012-01-16 NOTE — Assessment & Plan Note (Signed)
He is due for a TSH check today

## 2012-01-16 NOTE — Assessment & Plan Note (Signed)
He is doing well on lipitor 

## 2012-01-16 NOTE — Assessment & Plan Note (Signed)
It appears that this has improved

## 2012-01-16 NOTE — Progress Notes (Signed)
Subjective:    Patient ID: Andrew [name], male    DOB: October 14, 1928, 78 y.o.   MRN: [id number]  Diabetes He presents for his follow-up diabetic visit. He has type 2 diabetes mellitus. His disease course has been stable. There are no hypoglycemic associated symptoms. Pertinent negatives for hypoglycemia include no dizziness, headaches, pallor, seizures, speech difficulty, sweats or tremors. Pertinent negatives for diabetes include no blurred vision, no chest pain, no fatigue, no foot paresthesias, no foot ulcerations, no polydipsia, no polyphagia, no polyuria, no visual change, no weakness and no weight loss. There are no hypoglycemic complications. Symptoms are stable. Diabetic complications include heart disease. Current diabetic treatment includes oral agent (monotherapy). He is compliant with treatment most of the time. His weight is stable. He is following a generally healthy diet. Meal planning includes avoidance of concentrated sweets. He has not had a previous visit with a dietician. He participates in exercise intermittently. There is no change in his home blood glucose trend. An ACE inhibitor/angiotensin II receptor blocker is being taken. He does not see a podiatrist.Eye exam is current.  Hypertension This is a chronic problem. The current episode started more than 1 year ago. The problem has been gradually improving since onset. The problem is controlled. Pertinent negatives include no anxiety, blurred vision, chest pain, headaches, malaise/fatigue, neck pain, orthopnea, palpitations, peripheral edema, PND, shortness of breath or sweats. There are no associated agents to hypertension. Past treatments include calcium channel blockers, angiotensin blockers and diuretics. The current treatment provides moderate improvement. Compliance problems include exercise and diet.       Review of Systems  Constitutional: Negative for fever, chills, weight loss, malaise/fatigue, diaphoresis, activity  change, appetite change, fatigue and unexpected weight change.  HENT: Negative.  Negative for neck pain.   Eyes: Negative.  Negative for blurred vision.  Respiratory: Negative for cough, chest tightness, shortness of breath, wheezing and stridor.   Cardiovascular: Negative for chest pain, palpitations, orthopnea, leg swelling and PND.  Gastrointestinal: Negative for nausea, vomiting, abdominal pain, diarrhea, constipation, blood in stool and abdominal distention.  Genitourinary: Negative for dysuria, urgency, polyuria, frequency, hematuria, flank pain, decreased urine volume, enuresis and difficulty urinating.  Musculoskeletal: Negative for myalgias, back pain, joint swelling, arthralgias and gait problem.  Skin: Negative for color change, pallor, rash and wound.  Neurological: Negative for dizziness, tremors, seizures, syncope, facial asymmetry, speech difficulty, weakness, light-headedness, numbness and headaches.  Hematological: Negative for polydipsia, polyphagia and adenopathy. Does not bruise/bleed easily.  Psychiatric/Behavioral: Negative.        Objective:   Physical Exam  Vitals reviewed. Constitutional: He is oriented to person, place, and time. He appears well-developed and well-nourished. No distress.  HENT:  Head: Normocephalic and atraumatic.  Mouth/Throat: Oropharynx is clear and moist. No oropharyngeal exudate.  Eyes: Conjunctivae are normal. Right eye exhibits no discharge. Left eye exhibits no discharge. No scleral icterus.  Neck: Normal range of motion. Neck supple. No JVD present. No tracheal deviation present. No thyromegaly present.  Cardiovascular: Normal rate, regular rhythm, normal heart sounds and intact distal pulses.  Exam reveals no gallop and no friction rub.   No murmur heard. Pulmonary/Chest: Effort normal and breath sounds normal. No stridor. No respiratory distress. He has no wheezes. He has no rales. He exhibits no tenderness.  Abdominal: Soft. Bowel  sounds are normal. He exhibits no distension and no mass. There is no tenderness. There is no rebound and no guarding.  Musculoskeletal: Normal range of motion. He exhibits edema (trace edema  in both legs). He exhibits no tenderness.  Lymphadenopathy:    He has no cervical adenopathy.  Neurological: He is oriented to person, place, and time.  Skin: Skin is warm and dry. No rash noted. He is not diaphoretic. No erythema. No pallor.  Psychiatric: He has a normal mood and affect. His behavior is normal. Judgment and thought content normal.      Lab Results  Component Value Date   WBC 6.4 04/12/2011   HGB 13.1 04/12/2011   HCT 37.6* 04/12/2011   PLT 169.0 04/12/2011   GLUCOSE 186* 04/12/2011   CHOL 127 04/12/2011   TRIG 119.0 04/12/2011   HDL 37.20* 04/12/2011   LDLDIRECT 78.6 03/18/2010   LDLCALC 66 04/12/2011   ALT 58* 04/12/2011   AST 43* 04/12/2011   NA 142 04/12/2011   K 4.0 04/12/2011   CL 104 04/12/2011   CREATININE 1.3 04/12/2011   BUN 22 04/12/2011   CO2 29 04/12/2011   TSH 2.51 04/12/2011   PSA 2.51 04/12/2011   INR 1.0 12/10/2008   HGBA1C 6.9* 06/30/2011   MICROALBUR 45.5* 03/18/2010      Assessment & Plan:

## 2012-01-16 NOTE — Assessment & Plan Note (Signed)
His BP is well controlled, I will check his lytes and renal function 

## 2012-01-16 NOTE — Patient Instructions (Signed)

## 2012-01-27 ENCOUNTER — Telehealth: Payer: Self-pay

## 2012-01-27 NOTE — Telephone Encounter (Signed)
Patient called [facility] stating that per his lab letter he needs a two month follow up.

## 2012-02-21 ENCOUNTER — Other Ambulatory Visit: Payer: Self-pay | Admitting: Family Medicine

## 2012-03-12 ENCOUNTER — Encounter: Payer: Self-pay | Admitting: Gastroenterology

## 2012-03-19 ENCOUNTER — Ambulatory Visit (INDEPENDENT_AMBULATORY_CARE_PROVIDER_SITE_OTHER): Payer: Medicare Other | Admitting: Internal Medicine

## 2012-03-19 ENCOUNTER — Encounter: Payer: Self-pay | Admitting: Internal Medicine

## 2012-03-19 ENCOUNTER — Other Ambulatory Visit (INDEPENDENT_AMBULATORY_CARE_PROVIDER_SITE_OTHER): Payer: Medicare Other

## 2012-03-19 VITALS — BP 116/68 | HR 76 | Temp 98.4°F | Resp 16 | Wt 195.5 lb

## 2012-03-19 DIAGNOSIS — I10 Essential (primary) hypertension: Secondary | ICD-10-CM

## 2012-03-19 DIAGNOSIS — D510 Vitamin B12 deficiency anemia due to intrinsic factor deficiency: Secondary | ICD-10-CM

## 2012-03-19 DIAGNOSIS — H9192 Unspecified hearing loss, left ear: Secondary | ICD-10-CM | POA: Insufficient documentation

## 2012-03-19 DIAGNOSIS — E039 Hypothyroidism, unspecified: Secondary | ICD-10-CM

## 2012-03-19 DIAGNOSIS — D649 Anemia, unspecified: Secondary | ICD-10-CM

## 2012-03-19 DIAGNOSIS — H9190 Unspecified hearing loss, unspecified ear: Secondary | ICD-10-CM

## 2012-03-19 DIAGNOSIS — IMO0001 Reserved for inherently not codable concepts without codable children: Secondary | ICD-10-CM

## 2012-03-19 LAB — CBC WITH DIFFERENTIAL/PLATELET
Basophils Absolute: 0 10*3/uL (ref 0.0–0.1)
Eosinophils Absolute: 0.1 10*3/uL (ref 0.0–0.7)
HCT: 37.5 % — ABNORMAL LOW (ref 39.0–52.0)
Hemoglobin: 12.4 g/dL — ABNORMAL LOW (ref 13.0–17.0)
Lymphs Abs: 1.3 10*3/uL (ref 0.7–4.0)
MCHC: 33.2 g/dL (ref 30.0–36.0)
Monocytes Relative: 8 % (ref 3.0–12.0)
Neutro Abs: 4.6 10*3/uL (ref 1.4–7.7)
Platelets: 197 10*3/uL (ref 150.0–400.0)
RDW: 13.4 % (ref 11.5–14.6)

## 2012-03-19 LAB — COMPREHENSIVE METABOLIC PANEL
ALT: 28 U/L (ref 0–53)
AST: 29 U/L (ref 0–37)
Albumin: 4.1 g/dL (ref 3.5–5.2)
BUN: 26 mg/dL — ABNORMAL HIGH (ref 6–23)
Calcium: 9.3 mg/dL (ref 8.4–10.5)
Chloride: 106 mEq/L (ref 96–112)
Potassium: 3.9 mEq/L (ref 3.5–5.1)
Sodium: 141 mEq/L (ref 135–145)
Total Protein: 7.2 g/dL (ref 6.0–8.3)

## 2012-03-19 LAB — VITAMIN B12: Vitamin B-12: 561 pg/mL (ref 211–911)

## 2012-03-19 LAB — TSH: TSH: 2.06 u[IU]/mL (ref 0.35–5.50)

## 2012-03-19 LAB — IBC PANEL: Saturation Ratios: 26.9 % (ref 20.0–50.0)

## 2012-03-19 NOTE — Assessment & Plan Note (Signed)
Labs today

## 2012-03-19 NOTE — Progress Notes (Signed)
Subjective:    Patient ID: Andrew [name], male    DOB: June 03, 1928, 78 y.o.   MRN: [id number]  Anemia Presents for follow-up visit. There has been no abdominal pain, anorexia, bruising/bleeding easily, confusion, fever, leg swelling, light-headedness, malaise/fatigue, pallor, palpitations, paresthesias, pica or weight loss. Signs of blood loss that are not present include hematemesis, hematochezia and melena. There are no compliance problems.   Diabetes He presents for his follow-up diabetic visit. He has type 2 diabetes mellitus. His disease course has been stable. There are no hypoglycemic associated symptoms. Pertinent negatives for hypoglycemia include no confusion, dizziness, headaches, pallor, seizures, speech difficulty or tremors. Pertinent negatives for diabetes include no blurred vision, no chest pain, no fatigue, no foot paresthesias, no foot ulcerations, no polydipsia, no polyphagia, no polyuria, no visual change, no weakness and no weight loss. There are no hypoglycemic complications. Symptoms are stable. There are no diabetic complications. He is compliant with treatment all of the time. His weight is stable. He is following a generally healthy diet. Meal planning includes avoidance of concentrated sweets. He has not had a previous visit with a dietician. He participates in exercise intermittently. There is no change in his home blood glucose trend.      Review of Systems  Constitutional: Negative for fever, chills, weight loss, malaise/fatigue, diaphoresis, activity change, appetite change, fatigue and unexpected weight change.  HENT: Positive for hearing loss. Negative for ear pain, nosebleeds, congestion, sore throat, facial swelling, rhinorrhea, sneezing, drooling, mouth sores, trouble swallowing, neck pain, neck stiffness, dental problem, voice change, postnasal drip, sinus pressure, tinnitus and ear discharge.   Eyes: Negative.  Negative for blurred vision.  Respiratory:  Negative for apnea, cough, choking, chest tightness, shortness of breath, wheezing and stridor.   Cardiovascular: Negative for chest pain and palpitations.  Gastrointestinal: Negative.  Negative for abdominal pain, melena, hematochezia, anorexia and hematemesis.  Genitourinary: Negative.  Negative for polyuria.  Musculoskeletal: Negative for myalgias, back pain, joint swelling, arthralgias and gait problem.  Skin: Negative for pallor.  Neurological: Negative for dizziness, tremors, seizures, syncope, facial asymmetry, speech difficulty, weakness, light-headedness, numbness, headaches and paresthesias.  Hematological: Negative for polydipsia, polyphagia and adenopathy. Does not bruise/bleed easily.  Psychiatric/Behavioral: Negative.  Negative for confusion.       Objective:   Physical Exam  Vitals reviewed. Constitutional: He is oriented to person, place, and time. He appears well-developed and well-nourished. No distress.  HENT:  Head: Normocephalic and atraumatic.  Right Ear: Hearing, tympanic membrane, external ear and ear canal normal. No decreased hearing is noted.  Left Ear: Tympanic membrane, external ear and ear canal normal. Decreased hearing is noted.  Mouth/Throat: Oropharynx is clear and moist. No oropharyngeal exudate.  Eyes: Conjunctivae are normal. Right eye exhibits no discharge. Left eye exhibits no discharge. No scleral icterus.  Neck: Normal range of motion. Neck supple. No JVD present. No tracheal deviation present. No thyromegaly present.  Cardiovascular: Normal rate, regular rhythm, normal heart sounds and intact distal pulses.  Exam reveals no gallop and no friction rub.   No murmur heard. Pulmonary/Chest: Effort normal and breath sounds normal. No stridor. No respiratory distress. He has no wheezes. He has no rales. He exhibits no tenderness.  Abdominal: Soft. Bowel sounds are normal. He exhibits no distension and no mass. There is no tenderness. There is no rebound  and no guarding.  Musculoskeletal: Normal range of motion. He exhibits no edema and no tenderness.  Lymphadenopathy:    He has no cervical adenopathy.  Neurological: He is oriented to person, place, and time.  Skin: Skin is warm and dry. No rash noted. He is not diaphoretic. No erythema. No pallor.  Psychiatric: He has a normal mood and affect. His behavior is normal. Judgment and thought content normal.      Lab Results  Component Value Date   WBC 4.9 01/16/2012   HGB 12.0* 01/16/2012   HCT 34.9* 01/16/2012   PLT 163.0 01/16/2012   GLUCOSE 113* 01/16/2012   CHOL 101 01/16/2012   TRIG 49.0 01/16/2012   HDL 43.10 01/16/2012   LDLDIRECT 78.6 03/18/2010   LDLCALC 48 01/16/2012   ALT 22 01/16/2012   AST 24 01/16/2012   NA 141 01/16/2012   K 4.2 01/16/2012   CL 105 01/16/2012   CREATININE 1.5 01/16/2012   BUN 23 01/16/2012   CO2 28 01/16/2012   TSH 2.81 01/16/2012   PSA 2.51 04/12/2011   INR 1.0 12/10/2008   HGBA1C 6.0 01/16/2012   MICROALBUR 45.5* 03/18/2010      Assessment & Plan:

## 2012-03-19 NOTE — Assessment & Plan Note (Signed)
Check his TSH level today

## 2012-03-19 NOTE — Assessment & Plan Note (Signed)
Audiology referral.

## 2012-03-19 NOTE — Patient Instructions (Signed)
Diabetes, Type 2 Diabetes is a long-lasting (chronic) disease. In type 2 diabetes, the pancreas does not make enough insulin (a hormone), and the body does not respond normally to the insulin that is made. This type of diabetes was also previously called adult-onset diabetes. It usually occurs after the age of 22, but it can occur at any age.  CAUSES  Type 2 diabetes happens because the pancreasis not making enough insulin or your body has trouble using the insulin that your pancreas does make properly. SYMPTOMS   Drinking more than usual.   Urinating more than usual.   Blurred vision.   Dry, itchy skin.   Frequent infections.   Feeling more tired than usual (fatigue).  DIAGNOSIS The diagnosis of type 2 diabetes is usually made by one of the following tests:  Fasting blood glucose test. You will not eat for at least 8 hours and then take a blood test.   Random blood glucose test. Your blood glucose (sugar) is checked at any time of the day regardless of when you ate.   Oral glucose tolerance test (OGTT). Your blood glucose is measured after you have not eaten (fasted) and then after you drink a glucose containing beverage.  TREATMENT   Healthy eating.   Exercise.   Medicine, if needed.   Monitoring blood glucose.   Seeing your caregiver regularly.  HOME CARE INSTRUCTIONS   Check your blood glucose at least once a day. More frequent monitoring may be necessary, depending on your medicines and on how well your diabetes is controlled. Your caregiver will advise you.   Take your medicine as directed by your caregiver.   Do not smoke.   Make wise food choices. Ask your caregiver for information. Weight loss can improve your diabetes.   Learn about low blood glucose (hypoglycemia) and how to treat it.   Get your eyes checked regularly.   Have a yearly physical exam. Have your blood pressure checked and your blood and urine tested.   Wear a pendant or bracelet saying  that you have diabetes.   Check your feet every night for cuts, sores, blisters, and redness. Let your caregiver know if you have any problems.  SEEK MEDICAL CARE IF:   You have problems keeping your blood glucose in target range.   You have problems with your medicines.   You have symptoms of an illness that do not improve after 24 hours.   You have a sore or wound that is not healing.   You notice a change in vision or a new problem with your vision.   You have a fever.  MAKE SURE YOU:  Understand these instructions.   Will watch your condition.   Will get help right away if you are not doing well or get worse.  Document Released: 12/12/2005 Document Revised: 12/01/2011 Document Reviewed: 05/30/2011 [facility] Patient Information 2012 [email or web address], Nonspecific Your exam and blood tests show you are anemic. This means your blood (hemoglobin) level is low. Normal hemoglobin values are 12 to 15 g/dL for females and 14 to 17 g/dL for males. Make a note of your hemoglobin level today. The hematocrit percent is also used to measure anemia. A normal hematocrit is 38% to 46% in females and 42% to 49% in males. Make a note of your hematocrit level today. CAUSES  Anemia can be due to many different causes.  Excessive bleeding from periods (in women).   Intestinal bleeding.   Poor nutrition.   Kidney, thyroid,  liver, and bone marrow diseases.  SYMPTOMS  Anemia can come on suddenly (acute). It can also come on slowly. Symptoms can include:  Minor weakness.   Dizziness.   Palpitations.   Shortness of breath.  Symptoms may be absent until half your hemoglobin is missing if it comes on slowly. Anemia due to acute blood loss from an injury or internal bleeding may require blood transfusion if the loss is severe. Hospital care is needed if you are anemic and there is significant continual blood loss. TREATMENT   Stool tests for blood (Hemoccult) and additional lab tests are  often needed. This determines the best treatment.   Further checking on your condition and your response to treatment is very important. It often takes many weeks to correct anemia.  Depending on the cause, treatment can include:  Supplements of iron.   Vitamins [id number] and folic acid.   Hormone medicines.If your anemia is due to bleeding, finding the cause of the blood loss is very important. This will help avoid further problems.  SEEK IMMEDIATE MEDICAL CARE IF:   You develop fainting, extreme weakness, shortness of breath, or chest pain.   You develop heavy vaginal bleeding.   You develop bloody or black, tarry stools or vomit up blood.   You develop a high fever, rash, repeated vomiting, or dehydration.  Document Released: 01/19/2005 Document Revised: 12/01/2011 Document Reviewed: 10/27/2009 [facility] Patient Information 2012 [place].

## 2012-03-19 NOTE — Assessment & Plan Note (Signed)
He has no s/s of blood loss, today I will check his CBC and will screen him for vitamin deficiencies

## 2012-03-19 NOTE — Assessment & Plan Note (Signed)
I will check his a1c and will monitor his renal function 

## 2012-03-19 NOTE — Assessment & Plan Note (Signed)
His BP is well controlled, I will check his lytes today 

## 2012-03-20 ENCOUNTER — Other Ambulatory Visit: Payer: Self-pay | Admitting: *Deleted

## 2012-03-20 DIAGNOSIS — I6529 Occlusion and stenosis of unspecified carotid artery: Secondary | ICD-10-CM

## 2012-03-21 ENCOUNTER — Telehealth: Payer: Self-pay

## 2012-03-21 ENCOUNTER — Other Ambulatory Visit: Payer: Self-pay | Admitting: Family Medicine

## 2012-03-21 MED ORDER — CYCLOBENZAPRINE HCL 10 MG PO TABS: 10.0000 mg | ORAL_TABLET | Freq: Three times a day (TID) | ORAL | Status: AC | PRN

## 2012-03-21 NOTE — Telephone Encounter (Signed)
Received fax request from [facility] for cyclobenzaprine 10mg  TID PRN. Please advise if ok to fill Thanks

## 2012-03-21 NOTE — Telephone Encounter (Signed)
sure

## 2012-03-26 ENCOUNTER — Other Ambulatory Visit: Payer: Self-pay | Admitting: Family Medicine

## 2012-03-30 ENCOUNTER — Encounter (INDEPENDENT_AMBULATORY_CARE_PROVIDER_SITE_OTHER): Payer: Medicare Other

## 2012-03-30 DIAGNOSIS — I6529 Occlusion and stenosis of unspecified carotid artery: Secondary | ICD-10-CM

## 2012-04-09 ENCOUNTER — Encounter: Payer: Self-pay | Admitting: Internal Medicine

## 2012-04-11 ENCOUNTER — Telehealth: Payer: Self-pay | Admitting: *Deleted

## 2012-04-11 ENCOUNTER — Encounter: Payer: Self-pay | Admitting: Gastroenterology

## 2012-04-11 ENCOUNTER — Ambulatory Visit (INDEPENDENT_AMBULATORY_CARE_PROVIDER_SITE_OTHER): Payer: Medicare Other | Admitting: Gastroenterology

## 2012-04-11 VITALS — BP 118/60 | HR 80 | Ht 67.0 in | Wt 196.0 lb

## 2012-04-11 DIAGNOSIS — Z86010 Personal history of colonic polyps: Secondary | ICD-10-CM

## 2012-04-11 MED ORDER — PEG-KCL-NACL-NASULF-NA ASC-C 100 G PO SOLR: 1.0000 | Freq: Once | ORAL | Status: DC

## 2012-04-11 NOTE — Patient Instructions (Signed)
Your Colonoscopy is scheduled on 04/24/2012 at 9am We will contact Dr Ronnald Ramp about holding your Plavix prior to your procedure  Colonoscopy A colonoscopy is an exam to evaluate your entire colon. In this exam, your colon is cleansed. A long fiberoptic tube is inserted through your rectum and into your colon. The fiberoptic scope (endoscope) is a long bundle of enclosed and very flexible fibers. These fibers transmit light to the area examined and send images from that area to your caregiver. Discomfort is usually minimal. You may be given a drug to help you sleep (sedative) during or prior to the procedure. This exam helps to detect lumps (tumors), polyps, inflammation, and areas of bleeding. Your caregiver may also take a small piece of tissue (biopsy) that will be examined under a microscope. LET YOUR CAREGIVER KNOW ABOUT:   Allergies to food or medicine.   Medicines taken, including vitamins, herbs, eyedrops, over-the-counter medicines, and creams.   Use of steroids (by mouth or creams).   Previous problems with anesthetics or numbing medicines.   History of bleeding problems or blood clots.   Previous surgery.   Other health problems, including diabetes and kidney problems.   Possibility of pregnancy, if this applies.  BEFORE THE PROCEDURE   A clear liquid diet may be required for 2 days before the exam.   Ask your caregiver about changing or stopping your regular medications.   Liquid injections (enemas) or laxatives may be required.   A large amount of electrolyte solution may be given to you to drink over a short period of time. This solution is used to clean out your colon.   You should be present 60 minutes prior to your procedure or as directed by your caregiver.  AFTER THE PROCEDURE   If you received a sedative or pain relieving medication, you will need to arrange for someone to drive you home.   Occasionally, there is a little blood passed with the first bowel  movement. Do not be concerned.  FINDING OUT THE RESULTS OF YOUR TEST Not all test results are available during your visit. If your test results are not back during the visit, make an appointment with your caregiver to find out the results. Do not assume everything is normal if you have not heard from your caregiver or the medical facility. It is important for you to follow up on all of your test results. HOME CARE INSTRUCTIONS   It is not unusual to pass moderate amounts of gas and experience mild abdominal cramping following the procedure. This is due to air being used to inflate your colon during the exam. Walking or a warm pack on your belly (abdomen) may help.   You may resume all normal meals and activities after sedatives and medicines have worn off.   Only take over-the-counter or prescription medicines for pain, discomfort, or fever as directed by your caregiver. Do not use aspirin or blood thinners if a biopsy was taken. Consult your caregiver for medicine usage if biopsies were taken.  SEEK IMMEDIATE MEDICAL CARE IF:   You have a fever.   You pass large blood clots or fill a toilet with blood following the procedure. This may also occur 10 to 14 days following the procedure. This is more likely if a biopsy was taken.   You develop abdominal pain that keeps getting worse and cannot be relieved with medicine.  Document Released: 12/09/2000 Document Revised: 12/01/2011 Document Reviewed: 07/24/2008 [facility] Patient Information 2012 [place].

## 2012-04-11 NOTE — Telephone Encounter (Signed)
He should hold plavix for 2 weeks prior to the procedure

## 2012-04-11 NOTE — Telephone Encounter (Signed)
[place] [phone]   04/11/2012    RE: Andrew [name] DOB: 20-May-1928 MRN: [id number]   Dear  Dr Ronnald Ramp,    We have scheduled the above patient for an endoscopic procedure. Our records show that he is on anticoagulation therapy.   Please advise as to how long the patient may come off his therapy of Plavix prior to the procedure, which is scheduled for 04/24/2012.  Please fax back/ or route the completed form to [facility] at [phone].   Sincerely,  Genella Mech

## 2012-04-11 NOTE — Progress Notes (Signed)
History of Present Illness: Andrew [name] is a pleasant 78 year old white male with history of colon polyps referred at the request of Dr. Ronnald Ramp for followup colonoscopy. Adenomatous polyps were removed in 2008. He has no GI complaints including change of bowel habits, abdominal pain, melena or hematochezia. Family history is pertinent for his father who had colon cancer in his 62s. The patient has coronary artery disease and is on Plavix.    Past Medical History  Diagnosis Date  . CAD (coronary artery disease)   . LVF (left ventricular failure)   . Carotid artery stenosis   . Hyperlipidemia   . Hypertension   . GERD (gastroesophageal reflux disease)   . IBS (irritable bowel syndrome)   . Arthritis   . Diabetes mellitus   . Thyroid disease   . Anemia   . Vitamin d deficiency   . Depression   . Fatty liver 2008   Past Surgical History  Procedure Date  . Coronary artery bypass graft   . Total knee arthroplasty   . Abdominal aortic aneurysm repair    family history includes Cancer in his brother; Colon cancer in his father; Coronary artery disease in his brother; and Diabetes in his brother. Current Outpatient Prescriptions  Medication Sig Dispense Refill  . ALPRAZolam (XANAX) 0.5 MG tablet       . aspirin 325 MG tablet Take 325 mg by mouth daily.        Marland Kitchen atorvastatin (LIPITOR) 80 MG tablet TAKE 1 TABLET DAILY  90 tablet  2  . celecoxib (CELEBREX) 200 MG capsule Take 1 capsule (200 mg total) by mouth 2 (two) times daily.  180 capsule  3  . cilostazol (PLETAL) 100 MG tablet Take 1 tablet (100 mg total) by mouth 2 (two) times daily.  180 tablet  3  . clopidogrel (PLAVIX) 75 MG tablet Take 1 tablet (75 mg total) by mouth daily.  90 tablet  3  . DIOVAN 80 MG tablet TAKE 1 TABLET DAILY  90 tablet  2  . erythromycin with ethanol (THERAMYCIN) 2 % external solution APPLY TWICE DAILY.  60 mL  11  . ezetimibe (ZETIA) 10 MG tablet Take 1 tablet (10 mg total) by mouth daily.  90 tablet  3  .  fenofibrate (TRICOR) 145 MG tablet Take 1 tablet (145 mg total) by mouth daily.  90 tablet  3  . fish oil-omega-3 fatty acids 1000 MG capsule Take 2 g by mouth daily.        Marland Kitchen FLUoxetine (PROZAC) 10 MG capsule TAKE 1 CAPSULE DAILY  90 capsule  2  . furosemide (LASIX) 40 MG tablet TAKE 1 TABLET DAILY  180 tablet  0  . GLIPIZIDE XL 10 MG 24 hr tablet TAKE 1 TABLET TWICE A DAY  180 tablet  2  . glucosamine-chondroitin 500-400 MG tablet Take 1 tablet by mouth 2 (two) times daily.        Marland Kitchen glucose blood test strip 1 each by Other route as needed. Use as instructed       . metFORMIN (GLUCOPHAGE) 1000 MG tablet Take 1 tablet (1,000 mg total) by mouth 2 (two) times daily with a meal.  60 tablet  11  . Misc Natural Products (NF FORMULAS TESTOSTERONE) CAPS Take 2 capsules by mouth daily.      Marland Kitchen NIFEDICAL XL 30 MG 24 hr tablet TAKE 1 TABLET DAILY  90 tablet  2  . NIFEdipine (PROCARDIA-XL/ADALAT CC) 30 MG 24 hr tablet Take 30 mg  by mouth daily.        . nitroGLYCERIN (NITROSTAT) 0.4 MG SL tablet Place 0.4 mg under the tongue every 5 (five) minutes as needed.        . Tamsulosin HCl (FLOMAX) 0.4 MG CAPS Take 1 capsule (0.4 mg total) by mouth daily.  30 capsule  11  . temazepam (RESTORIL) 30 MG capsule Take 30 mg by mouth at bedtime as needed.         Allergies as of 04/11/2012 - Review Complete 04/11/2012  Allergen Reaction Noted  . Metoprolol  04/12/2011    reports that he has quit smoking. He has never used smokeless tobacco. He reports that he does not drink alcohol or use illicit drugs.     Review of Systems: He has occasional joint pains. Pertinent positive and negative review of systems were noted in the above HPI section. All other review of systems were otherwise negative.  Vital signs were reviewed in today's medical record Physical Exam: General: Well developed , well nourished, no acute distress Head: Normocephalic and atraumatic Eyes:  sclerae anicteric, EOMI Ears: Normal auditory  acuity Mouth: No deformity or lesions Neck: Supple, no masses or thyromegaly Lungs: Clear throughout to auscultation Heart: Regular rate and rhythm; no murmurs, rubs or bruits Abdomen: Soft, non tender and non distended. No masses, hepatosplenomegaly  noted. Normal Bowel sounds there is a small reducible umbilical hernia Rectal:deferred Musculoskeletal: Symmetrical with no gross deformities  Skin: No lesions on visible extremities Pulses:  Normal pulses noted Extremities: No clubbing, cyanosis, edema or deformities noted Neurological: Alert oriented x 4, grossly nonfocal Cervical Nodes:  No significant cervical adenopathy Inguinal Nodes: No significant inguinal adenopathy Psychological:  Alert and cooperative. Normal mood and affect

## 2012-04-11 NOTE — Assessment & Plan Note (Signed)
Plan followup colonoscopy. Plavix will be held if this is approved by his PCP

## 2012-04-12 NOTE — Telephone Encounter (Signed)
Dr Deatra Ina, Dr Ronnald Ramp wants patient to hold there Plavix 2 weeks. Do we need to hold the Plavix for more than 7 days?

## 2012-04-13 NOTE — Telephone Encounter (Signed)
7 days is sufficient

## 2012-04-13 NOTE — Telephone Encounter (Signed)
Called patient to inform ok for him to hold plavix 7 days before his  procedure

## 2012-04-24 ENCOUNTER — Encounter: Payer: Self-pay | Admitting: Gastroenterology

## 2012-04-24 ENCOUNTER — Ambulatory Visit (AMBULATORY_SURGERY_CENTER): Payer: Medicare Other | Admitting: Gastroenterology

## 2012-04-24 VITALS — BP 169/93 | HR 95 | Temp 97.2°F | Resp 15 | Ht 67.0 in | Wt 196.0 lb

## 2012-04-24 DIAGNOSIS — D126 Benign neoplasm of colon, unspecified: Secondary | ICD-10-CM

## 2012-04-24 DIAGNOSIS — Z86010 Personal history of colonic polyps: Secondary | ICD-10-CM

## 2012-04-24 DIAGNOSIS — K5730 Diverticulosis of large intestine without perforation or abscess without bleeding: Secondary | ICD-10-CM

## 2012-04-24 LAB — GLUCOSE, CAPILLARY
Glucose-Capillary: 74 mg/dL (ref 70–99)
Glucose-Capillary: 67 mg/dL — ABNORMAL LOW (ref 70–99)
Glucose-Capillary: 188 mg/dL — ABNORMAL HIGH (ref 70–99)

## 2012-04-24 MED ORDER — SODIUM CHLORIDE 0.9 % IV SOLN: 500.0000 mL | INTRAVENOUS | Status: DC

## 2012-04-24 NOTE — Op Note (Signed)
[place] [employer]. [place], [place]  [id number]  COLONOSCOPY PROCEDURE REPORT  PATIENT:  Andrew [name], Andrew [name]  MR#:  [id number] BIRTHDATE:  [birth year], 83 yrs. old  GENDER:  male ENDOSCOPIST:  Sandy Salaam. Deatra Ina, MD REF. BY: PROCEDURE DATE:  04/24/2012 PROCEDURE:  Colonoscopy with snare polypectomy ASA CLASS:  Class II INDICATIONS:  Screening, history of pre-cancerous (adenomatous) colon polyps Index polypectomy 2008 MEDICATIONS:   MAC sedation, administered by CRNA propofol 150mg IV  DESCRIPTION OF PROCEDURE:   After the risks benefits and alternatives of the procedure were thoroughly explained, informed consent was obtained.  Digital rectal exam was performed and revealed no abnormalities.   The LB CF-[id number] endoscope was introduced through the anus and advanced to the cecum, which was identified by the ileocecal valve, limited by poor preparation.  Moderate amount of retained liquid stool  The quality of the prep was Moviprep fair.  The instrument was then slowly withdrawn as the colon was fully examined. <<PROCEDUREIMAGES>>  FINDINGS:  A sessile polyp was found in the ascending colon. It was 3 mm in size. Sessile, flat polyp Polyp was snared without cautery. Retrieval was successful (see image4). snare polyp Moderate diverticulosis was found in the sigmoid colon (see image1).  This was otherwise a normal examination of the colon (see image3, image5, and image6).   Retroflexed views in the rectum revealed no abnormalities.    The time to cecum =  1) 5.50 minutes. The scope was then withdrawn in  1) 11.0  minutes from the cecum and the procedure completed. COMPLICATIONS:  None ENDOSCOPIC IMPRESSION: 1) 3 mm sessile polyp in the ascending colon 2) Moderate diverticulosis in the sigmoid colon 3) Otherwise normal examination RECOMMENDATIONS: 1) If the polyp(s) removed today are proven to be adenomatous (pre-cancerous) polyps, you will need a repeat  colonoscopy in 5 years. Otherwise you should continue to follow colorectal cancer screening guidelines for "routine risk" patients with colonoscopy in 10 years. You will receive a letter within 1-2 weeks with the results of your biopsy as well as final recommendations. Please call my office if you have not received a letter after 3 weeks. 2) Resume plavix in 5 days REPEAT EXAM:   You will receive a letter from Dr. Deatra Ina in 1-2 weeks, after reviewing the final pathology, with followup recommendations.  ______________________________ Sandy Salaam Deatra Ina, MD  CC:  Janith Lima, MD  n. Lorrin Mais:   Sandy Salaam. Alex Leahy at 04/24/2012 10:12 AM  Carlus Pavlov, [id number]

## 2012-04-24 NOTE — Progress Notes (Signed)
The pt tolerated the colonoscopy very well. Maw   

## 2012-04-24 NOTE — Progress Notes (Signed)
Propofol was administered by Levin Erp ,CRNA.  Per the pt he has been off plavix for 1 week.  Dr. Deatra Ina was made ware of this pre-procedure. Maw

## 2012-04-24 NOTE — Patient Instructions (Addendum)
Resume plavix in 5 days  YOU HAD AN ENDOSCOPIC PROCEDURE TODAY AT [facility]: Refer to the procedure report that was given to you for any specific questions about what was found during the examination.  If the procedure report does not answer your questions, please call your gastroenterologist to clarify.  If you requested that your care partner not be given the details of your procedure findings, then the procedure report has been included in a sealed envelope for you to review at your convenience later.  YOU SHOULD EXPECT: Some feelings of bloating in the abdomen. Passage of more gas than usual.  Walking can help get rid of the air that was put into your GI tract during the procedure and reduce the bloating. If you had a lower endoscopy (such as a colonoscopy or flexible sigmoidoscopy) you may notice spotting of blood in your stool or on the toilet paper. If you underwent a bowel prep for your procedure, then you may not have a normal bowel movement for a few days.  DIET: Your first meal following the procedure should be a light meal and then it is ok to progress to your normal diet.  A half-sandwich or bowl of soup is an example of a good first meal.  Heavy or fried foods are harder to digest and may make you feel nauseous or bloated.  Likewise meals heavy in dairy and vegetables can cause extra gas to form and this can also increase the bloating.  Drink plenty of fluids but you should avoid alcoholic beverages for 24 hours.  ACTIVITY: Your care partner should take you home directly after the procedure.  You should plan to take it easy, moving slowly for the rest of the day.  You can resume normal activity the day after the procedure however you should NOT DRIVE or use heavy machinery for 24 hours (because of the sedation medicines used during the test).    SYMPTOMS TO REPORT IMMEDIATELY: A gastroenterologist can be reached at any hour.  During normal business hours, 8:30 AM to 5:00  PM Monday through Friday, call [phone].  After hours and on weekends, please call the GI answering service at [phone] who will take a message and have the physician on call contact you.   Following lower endoscopy (colonoscopy or flexible sigmoidoscopy):  Excessive amounts of blood in the stool  Significant tenderness or worsening of abdominal pains  Swelling of the abdomen that is new, acute  Fever of 100F or higher  FOLLOW UP: If any biopsies were taken you will be contacted by phone or by letter within the next 1-3 weeks.  Call your gastroenterologist if you have not heard about the biopsies in 3 weeks.  Our staff will call the home number listed on your records the next business day following your procedure to check on you and address any questions or concerns that you may have at that time regarding the information given to you following your procedure. This is a courtesy call and so if there is no answer at the home number and we have not heard from you through the emergency physician on call, we will assume that you have returned to your regular daily activities without incident.  SIGNATURES/CONFIDENTIALITY: You and/or your care partner have signed paperwork which will be entered into your electronic medical record.  These signatures attest to the fact that that the information above on your After Visit Summary has been reviewed and is understood.  Full  responsibility of the confidentiality of this discharge information lies with you and/or your care-partner.  

## 2012-04-24 NOTE — Progress Notes (Signed)
Patient did not experience any of the following events: a burn prior to discharge; a fall within the facility; wrong site/side/patient/procedure/implant event; or a hospital transfer or hospital admission upon discharge from the facility. (G8907) Patient did not have preoperative order for IV antibiotic SSI prophylaxis. (G8918)  

## 2012-04-25 ENCOUNTER — Telehealth: Payer: Self-pay | Admitting: *Deleted

## 2012-04-25 ENCOUNTER — Other Ambulatory Visit: Payer: Self-pay | Admitting: Family Medicine

## 2012-04-25 NOTE — Telephone Encounter (Signed)
  Follow up Call-  Call back number 04/24/2012  Post procedure Call Back phone  # [phone]  Permission to leave phone message Yes     No answer and no answering machine picked up.

## 2012-04-26 ENCOUNTER — Other Ambulatory Visit: Payer: Self-pay | Admitting: Family Medicine

## 2012-04-27 LAB — HM COLONOSCOPY

## 2012-05-02 ENCOUNTER — Encounter: Payer: Self-pay | Admitting: Gastroenterology

## 2012-05-09 ENCOUNTER — Encounter: Payer: Self-pay | Admitting: Cardiology

## 2012-05-09 ENCOUNTER — Ambulatory Visit (INDEPENDENT_AMBULATORY_CARE_PROVIDER_SITE_OTHER): Payer: Medicare Other | Admitting: Cardiology

## 2012-05-09 VITALS — BP 140/64 | HR 96 | Ht 67.0 in | Wt 192.0 lb

## 2012-05-09 DIAGNOSIS — I2510 Atherosclerotic heart disease of native coronary artery without angina pectoris: Secondary | ICD-10-CM

## 2012-05-09 DIAGNOSIS — I10 Essential (primary) hypertension: Secondary | ICD-10-CM

## 2012-05-09 DIAGNOSIS — K219 Gastro-esophageal reflux disease without esophagitis: Secondary | ICD-10-CM

## 2012-05-09 DIAGNOSIS — I6529 Occlusion and stenosis of unspecified carotid artery: Secondary | ICD-10-CM

## 2012-05-09 DIAGNOSIS — E785 Hyperlipidemia, unspecified: Secondary | ICD-10-CM

## 2012-05-09 NOTE — Assessment & Plan Note (Signed)
I have asked him to enroll in the ACCELERATE trial. He would like to participate. This would give him an opportunity to significantly increase his HDL.

## 2012-05-09 NOTE — Progress Notes (Signed)
HPI Andrew [name] comes in today for evaluation and management of his coronary artery disease, carotid artery disease, and multiple cardiovascular risk factors. He is now being followed by Dr. Ronnald Ramp for primary care.  He's been very active in his garden. He's not having any chest discomfort or angina. He was intolerant to metoprolol with shortness of breath.  Meds reviewed he is compliant. His weight has been stable. Carotid Dopplers in April were stable.  His lipids are well controlled except for his HDL being low.    Past Medical History  Diagnosis Date  . CAD (coronary artery disease)   . LVF (left ventricular failure)   . Carotid artery stenosis   . Hyperlipidemia   . Hypertension   . GERD (gastroesophageal reflux disease)   . IBS (irritable bowel syndrome)   . Arthritis   . Diabetes mellitus   . Thyroid disease   . Anemia   . Vitamin d deficiency   . Depression   . Fatty liver 2008    Current Outpatient Prescriptions  Medication Sig Dispense Refill  . ALPRAZolam (XANAX) 0.5 MG tablet       . aspirin 325 MG tablet Take 325 mg by mouth daily.        Marland Kitchen atorvastatin (LIPITOR) 80 MG tablet TAKE 1 TABLET DAILY  90 tablet  2  . CELEBREX 200 MG capsule TAKE 1 CAPSULE TWICE DAILY  180 capsule  2  . cilostazol (PLETAL) 100 MG tablet TAKE 1 TABLET TWICE A DAY  180 tablet  2  . DIOVAN 80 MG tablet TAKE 1 TABLET DAILY  90 tablet  2  . ezetimibe (ZETIA) 10 MG tablet Take 1 tablet (10 mg total) by mouth daily.  90 tablet  3  . fenofibrate (TRICOR) 145 MG tablet Take 1 tablet (145 mg total) by mouth daily.  90 tablet  3  . fish oil-omega-3 fatty acids 1000 MG capsule Take 2 g by mouth daily.        Marland Kitchen FLUoxetine (PROZAC) 10 MG capsule TAKE 1 CAPSULE DAILY  90 capsule  2  . furosemide (LASIX) 40 MG tablet TAKE 1 TABLET DAILY  180 tablet  0  . GLIPIZIDE XL 10 MG 24 hr tablet TAKE 1 TABLET TWICE A DAY  180 tablet  2  . glucosamine-chondroitin 500-400 MG tablet Take 1 tablet by mouth 2 (two)  times daily.        Marland Kitchen glucose blood test strip 1 each by Other route as needed. Use as instructed       . Misc Natural Products (NF FORMULAS TESTOSTERONE) CAPS Take 2 capsules by mouth daily.      Marland Kitchen NIFEDICAL XL 30 MG 24 hr tablet TAKE 1 TABLET DAILY  90 tablet  2  . NIFEdipine (PROCARDIA-XL/ADALAT CC) 30 MG 24 hr tablet Take 30 mg by mouth daily.        . nitroGLYCERIN (NITROSTAT) 0.4 MG SL tablet Place 0.4 mg under the tongue every 5 (five) minutes as needed.        . Tamsulosin HCl (FLOMAX) 0.4 MG CAPS Take 1 capsule (0.4 mg total) by mouth daily.  30 capsule  11  . temazepam (RESTORIL) 30 MG capsule Take 30 mg by mouth at bedtime as needed.          Allergies  Allergen Reactions  . Metoprolol     Heart starts racing. Shallow breathing     Family History  Problem Relation Age of Onset  . Colon cancer  Father   . Coronary artery disease Brother   . Diabetes Brother   . Cancer Brother     throat and abdominal    History   Social History  . Marital Status: Married    Spouse Name: N/A    Number of Children: N/A  . Years of Education: N/A   Occupational History  . Retired    Social History Main Topics  . 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 Smokeless tobacco: Never Used   Comment: quit atleast 25 yrs ago, per pt  . Alcohol Use: Yes     socially  . Drug Use: No  . Sexually Active: Yes   Other Topics Concern  . Not on file   Social History Narrative  . No narrative on file    ROS ALL NEGATIVE EXCEPT THOSE NOTED IN HPI  PE  General Appearance: well developed, well nourished in no acute distress, overweight HEENT: symmetrical face, PERRLA, good dentition  Neck: no JVD, thyromegaly, or adenopathy, trachea midline Chest: symmetric without deformity Cardiac: PMI non-displaced, RRR, normal S1, S2, no gallop or murmur Lung: clear to ausculation and percussion Vascular: all pulses full without bruits  Abdominal: nondistended, nontender, good bowel sounds, no HSM, no  bruits Extremities: no cyanosis, clubbing or edema, no sign of DVT, no varicosities  Skin: normal color, no rashes Neuro: alert and oriented x 3, non-focal Pysch: normal affect  EKG Normal sinus rhythm with first-degree AV block, PVC. Left anterior fascicular block. No acute change BMET    Component Value Date/Time   NA 141 03/19/2012 1024   K 3.9 03/19/2012 1024   CL 106 03/19/2012 1024   CO2 27 03/19/2012 1024   GLUCOSE 60* 03/19/2012 1024   BUN 26* 03/19/2012 1024   CREATININE 1.4 03/19/2012 1024   CALCIUM 9.3 03/19/2012 1024   GFRNONAA 51.89 03/18/2010 0854   GFRAA 47 02/03/2009 0946    Lipid Panel     Component Value Date/Time   CHOL 101 01/16/2012 1121   TRIG 49.0 01/16/2012 1121   HDL 43.10 01/16/2012 1121   CHOLHDL 2 01/16/2012 1121   VLDL 9.8 01/16/2012 1121   LDLCALC 48 01/16/2012 1121    CBC    Component Value Date/Time   WBC 6.7 03/19/2012 1024   RBC 3.87* 03/19/2012 1024   HGB 12.4* 03/19/2012 1024   HCT 37.5* 03/19/2012 1024   PLT 197.0 03/19/2012 1024   MCV 96.9 03/19/2012 1024   MCHC 33.2 03/19/2012 1024   RDW 13.4 03/19/2012 1024   LYMPHSABS 1.3 03/19/2012 1024   MONOABS 0.5 03/19/2012 1024   EOSABS 0.1 03/19/2012 1024   BASOSABS 0.0 03/19/2012 1024

## 2012-05-09 NOTE — Assessment & Plan Note (Signed)
Stable. Continue secondary preventative therapy. He is intolerant to beta blockers.

## 2012-05-09 NOTE — Patient Instructions (Signed)
Your physician recommends that you continue on your current medications as directed. Please refer to the Current Medication list given to you today.  Your physician wants you to follow-up in: 1 year. You will receive a reminder letter in the mail two months in advance. If you don't receive a letter, please call our office to schedule the follow-up appointment.  

## 2012-05-14 ENCOUNTER — Other Ambulatory Visit: Payer: Self-pay | Admitting: Family Medicine

## 2012-05-25 ENCOUNTER — Encounter: Payer: Self-pay | Admitting: Internal Medicine

## 2012-05-25 ENCOUNTER — Ambulatory Visit (INDEPENDENT_AMBULATORY_CARE_PROVIDER_SITE_OTHER): Payer: Medicare Other | Admitting: Internal Medicine

## 2012-05-25 VITALS — BP 108/58 | HR 86 | Temp 98.1°F | Resp 16 | Wt 195.0 lb

## 2012-05-25 DIAGNOSIS — S39012A Strain of muscle, fascia and tendon of lower back, initial encounter: Secondary | ICD-10-CM

## 2012-05-25 DIAGNOSIS — I10 Essential (primary) hypertension: Secondary | ICD-10-CM

## 2012-05-25 NOTE — Assessment & Plan Note (Signed)
He has LBP but no s/s of radiculopathy. He was given an injection of depo-medrol IM to reduce any inflammation that may have developed from the digging activity, he will continue the other meds with no changes

## 2012-05-25 NOTE — Assessment & Plan Note (Signed)
His BP is well controlled 

## 2012-05-25 NOTE — Progress Notes (Signed)
Subjective:    Patient ID: Andrew [name], male    DOB: [birth year], 78 y.o.   MRN: [id number]  Back Pain This is a recurrent problem. The current episode started yesterday. The problem occurs intermittently. The problem is unchanged. The pain is present in the lumbar spine. The quality of the pain is described as aching. The pain does not radiate. The pain is at a severity of 2/10. The pain is mild. The pain is worse during the day. The symptoms are aggravated by bending. Stiffness is present all day. Pertinent negatives include no abdominal pain, bladder incontinence, bowel incontinence, chest pain, dysuria, fever, headaches, leg pain, numbness, paresis, paresthesias, pelvic pain, perianal numbness, tingling, weakness or weight loss. Risk factors: he did some digging yesterday. He has tried NSAIDs for the symptoms. The treatment provided moderate relief.      Review of Systems  Constitutional: Negative.  Negative for fever and weight loss.  HENT: Negative.   Eyes: Negative.   Respiratory: Negative for cough, chest tightness, shortness of breath, wheezing and stridor.   Cardiovascular: Negative for chest pain, palpitations and leg swelling.  Gastrointestinal: Negative for nausea, vomiting, abdominal pain, diarrhea, constipation, abdominal distention and bowel incontinence.  Genitourinary: Negative for bladder incontinence, dysuria and pelvic pain.  Musculoskeletal: Positive for back pain. Negative for myalgias, joint swelling, arthralgias and gait problem.  Skin: Negative.   Neurological: Negative.  Negative for tingling, weakness, numbness, headaches and paresthesias.  Hematological: Negative for adenopathy. Does not bruise/bleed easily.  Psychiatric/Behavioral: Negative.        Objective:   Physical Exam  Vitals reviewed. Constitutional: He is oriented to person, place, and time. He appears well-developed and well-nourished. No distress.  HENT:  Head: Normocephalic and atraumatic.    Mouth/Throat: Oropharynx is clear and moist. No oropharyngeal exudate.  Eyes: Conjunctivae are normal. Right eye exhibits no discharge. Left eye exhibits no discharge. No scleral icterus.  Neck: Normal range of motion. Neck supple. No JVD present. No tracheal deviation present. No thyromegaly present.  Cardiovascular: Normal rate, normal heart sounds and intact distal pulses.  Exam reveals no gallop and no friction rub.   No murmur heard. Pulmonary/Chest: Effort normal and breath sounds normal. No respiratory distress. He has no wheezes. He has no rales. He exhibits no tenderness.  Abdominal: Soft. Bowel sounds are normal. He exhibits no distension and no mass. There is no tenderness. There is no rebound and no guarding.  Musculoskeletal: Normal range of motion. He exhibits no edema.       Lumbar back: Normal. He exhibits normal range of motion, no tenderness, no bony tenderness, no swelling, no edema, no deformity, no laceration, no pain and no spasm.  Lymphadenopathy:    He has no cervical adenopathy.  Neurological: He is alert and oriented to person, place, and time. He has normal strength. He displays no atrophy, no tremor and normal reflexes. No cranial nerve deficit or sensory deficit. He exhibits normal muscle tone. He displays a negative Romberg sign. He displays no seizure activity. Coordination and gait normal. He displays no Babinski's sign on the right side. He displays no Babinski's sign on the left side.  Reflex Scores:      Tricep reflexes are 1+ on the right side and 1+ on the left side.      Bicep reflexes are 1+ on the right side.      Brachioradialis reflexes are 1+ on the right side and 1+ on the left side.  Patellar reflexes are 1+ on the right side and 1+ on the left side.      Achilles reflexes are 1+ on the right side and 1+ on the left side.      - SLR in BLE  Skin: Skin is warm and dry. No rash noted. He is not diaphoretic. No erythema. No pallor.  Psychiatric: He  has a normal mood and affect. His behavior is normal. Judgment and thought content normal.      Lab Results  Component Value Date   WBC 6.7 03/19/2012   HGB 12.4* 03/19/2012   HCT 37.5* 03/19/2012   PLT 197.0 03/19/2012   GLUCOSE 60* 03/19/2012   CHOL 101 01/16/2012   TRIG 49.0 01/16/2012   HDL 43.10 01/16/2012   LDLDIRECT 78.6 03/18/2010   LDLCALC 48 01/16/2012   ALT 28 03/19/2012   AST 29 03/19/2012   NA 141 03/19/2012   K 3.9 03/19/2012   CL 106 03/19/2012   CREATININE 1.4 03/19/2012   BUN 26* 03/19/2012   CO2 27 03/19/2012   TSH 2.06 03/19/2012   PSA 2.51 04/12/2011   INR 1.0 12/10/2008   HGBA1C 6.0 03/19/2012   MICROALBUR 45.5* 03/18/2010      Assessment & Plan:

## 2012-05-25 NOTE — Patient Instructions (Signed)
Lumbosacral Strain Lumbosacral strain is one of the most common causes of back pain. There are many causes of back pain. Most are not serious conditions. CAUSES  Your backbone (spinal column) is made up of 24 main vertebral bodies, the sacrum, and the coccyx. These are held together by muscles and tough, fibrous tissue (ligaments). Nerve roots pass through the openings between the vertebrae. A sudden move or injury to the back may cause injury to, or pressure on, these nerves. This may result in localized back pain or pain movement (radiation) into the buttocks, down the leg, and into the foot. Sharp, shooting pain from the buttock down the back of the leg (sciatica) is frequently associated with a ruptured (herniated) disk. Pain may be caused by muscle spasm alone. Your caregiver can often find the cause of your pain by the details of your symptoms and an exam. In some cases, you may need tests (such as X-rays). Your caregiver will work with you to decide if any tests are needed based on your specific exam. HOME CARE INSTRUCTIONS   Avoid an underactive lifestyle. Active exercise, as directed by your caregiver, is your greatest weapon against back pain.   Avoid hard physical activities (tennis, racquetball, waterskiing) if you are not in proper physical condition for it. This may aggravate or create problems.   If you have a back problem, avoid sports requiring sudden body movements. Swimming and walking are generally safer activities.   Maintain good posture.   Avoid becoming overweight (obese).   Use bed rest for only the most extreme, sudden (acute) episode. Your caregiver will help you determine how much bed rest is necessary.   For acute conditions, you may put ice on the injured area.   Put ice in a plastic bag.   Place a towel between your skin and the bag.   Leave the ice on for 15 to 20 minutes at a time, every 2 hours, or as needed.   After you are improved and more active, it  may help to apply heat for 30 minutes before activities.  See your caregiver if you are having pain that lasts longer than expected. Your caregiver can advise appropriate exercises or therapy if needed. With conditioning, most back problems can be avoided. SEEK IMMEDIATE MEDICAL CARE IF:   You have numbness, tingling, weakness, or problems with the use of your arms or legs.   You experience severe back pain not relieved with medicines.   There is a change in bowel or bladder control.   You have increasing pain in any area of the body, including your belly (abdomen).   You notice shortness of breath, dizziness, or feel faint.   You feel sick to your stomach (nauseous), are throwing up (vomiting), or become sweaty.   You notice discoloration of your toes or legs, or your feet get very cold.   Your back pain is getting worse.   You have a fever.  MAKE SURE YOU:   Understand these instructions.   Will watch your condition.   Will get help right away if you are not doing well or get worse.  Document Released: 09/21/2005 Document Revised: 12/01/2011 Document Reviewed: 03/13/2009 [employer] Patient Information 2012 [employer]. 

## 2012-05-25 NOTE — Progress Notes (Signed)
Addended by: Estell Harpin T on: 05/25/2012 11:55 AM   Modules accepted: Orders

## 2012-06-04 ENCOUNTER — Other Ambulatory Visit: Payer: Self-pay | Admitting: Family Medicine

## 2012-06-25 ENCOUNTER — Other Ambulatory Visit: Payer: Self-pay

## 2012-06-25 DIAGNOSIS — I6529 Occlusion and stenosis of unspecified carotid artery: Secondary | ICD-10-CM

## 2012-06-25 MED ORDER — CLOPIDOGREL BISULFATE 75 MG PO TABS: 75.0000 mg | ORAL_TABLET | Freq: Every day | ORAL | Status: DC

## 2012-06-25 MED ORDER — FENOFIBRATE 145 MG PO TABS: 145.0000 mg | ORAL_TABLET | Freq: Every day | ORAL | Status: DC

## 2012-07-02 ENCOUNTER — Ambulatory Visit (INDEPENDENT_AMBULATORY_CARE_PROVIDER_SITE_OTHER): Payer: Medicare Other | Admitting: Internal Medicine

## 2012-07-02 ENCOUNTER — Encounter: Payer: Self-pay | Admitting: Internal Medicine

## 2012-07-02 VITALS — BP 106/58 | HR 80 | Temp 98.6°F | Resp 16 | Wt 196.0 lb

## 2012-07-02 DIAGNOSIS — L989 Disorder of the skin and subcutaneous tissue, unspecified: Secondary | ICD-10-CM

## 2012-07-02 NOTE — Assessment & Plan Note (Signed)
This looks like a BCC so I have referred him to dermatology

## 2012-07-02 NOTE — Progress Notes (Signed)
  Subjective:    Patient ID: Andrew [name], male    DOB: [birth year], 78 y.o.   MRN: [id number]  HPI  He returns c/o a mole on his back that frequently gets irritated.  Review of Systems  Constitutional: Negative.   HENT: Negative.   Eyes: Negative.   Respiratory: Negative.   Cardiovascular: Negative.   Gastrointestinal: Negative.   Genitourinary: Negative.   Musculoskeletal: Negative.   Skin: Positive for color change. Negative for pallor, rash and wound.  Neurological: Negative.   Hematological: Negative.   Psychiatric/Behavioral: Negative.        Objective:   Physical Exam  Skin: Skin is warm, dry and intact. Lesion noted. No abrasion, no bruising, no burn, no ecchymosis, no laceration and no rash noted. Rash is not maculopapular. He is not diaphoretic. No erythema. No pallor.             Assessment & Plan:

## 2012-07-06 ENCOUNTER — Telehealth: Payer: Self-pay

## 2012-07-06 MED ORDER — ALPRAZOLAM 0.5 MG PO TABS: 0.5000 mg | ORAL_TABLET | Freq: Three times a day (TID) | ORAL | Status: DC | PRN

## 2012-07-06 MED ORDER — TEMAZEPAM 30 MG PO CAPS: 30.0000 mg | ORAL_CAPSULE | Freq: Every evening | ORAL | Status: DC | PRN

## 2012-07-06 NOTE — Telephone Encounter (Signed)
yes

## 2012-07-06 NOTE — Telephone Encounter (Signed)
Please advise if ok to refill temazepam 30 mg 1qhs and alprazolam 0.5 mg tid Thanks

## 2012-07-24 ENCOUNTER — Encounter: Payer: Self-pay | Admitting: Internal Medicine

## 2012-07-24 ENCOUNTER — Other Ambulatory Visit (INDEPENDENT_AMBULATORY_CARE_PROVIDER_SITE_OTHER): Payer: Medicare Other

## 2012-07-24 ENCOUNTER — Ambulatory Visit (INDEPENDENT_AMBULATORY_CARE_PROVIDER_SITE_OTHER): Payer: Medicare Other | Admitting: Internal Medicine

## 2012-07-24 VITALS — BP 118/58 | HR 72 | Temp 97.6°F | Resp 16 | Ht 64.0 in | Wt 193.0 lb

## 2012-07-24 DIAGNOSIS — E1165 Type 2 diabetes mellitus with hyperglycemia: Secondary | ICD-10-CM

## 2012-07-24 DIAGNOSIS — I10 Essential (primary) hypertension: Secondary | ICD-10-CM

## 2012-07-24 DIAGNOSIS — D649 Anemia, unspecified: Secondary | ICD-10-CM

## 2012-07-24 DIAGNOSIS — E039 Hypothyroidism, unspecified: Secondary | ICD-10-CM

## 2012-07-24 DIAGNOSIS — E785 Hyperlipidemia, unspecified: Secondary | ICD-10-CM

## 2012-07-24 LAB — CBC WITH DIFFERENTIAL/PLATELET
Basophils Absolute: 0 10*3/uL (ref 0.0–0.1)
Eosinophils Absolute: 0.2 10*3/uL (ref 0.0–0.7)
Eosinophils Relative: 3.8 % (ref 0.0–5.0)
Lymphs Abs: 1 10*3/uL (ref 0.7–4.0)
MCHC: 33.5 g/dL (ref 30.0–36.0)
MCV: 97.7 fl (ref 78.0–100.0)
Monocytes Absolute: 0.5 10*3/uL (ref 0.1–1.0)
Neutrophils Relative %: 66.3 % (ref 43.0–77.0)
Platelets: 175 10*3/uL (ref 150.0–400.0)
RDW: 13.2 % (ref 11.5–14.6)
WBC: 5.4 10*3/uL (ref 4.5–10.5)

## 2012-07-24 LAB — COMPREHENSIVE METABOLIC PANEL
ALT: 36 U/L (ref 0–53)
AST: 36 U/L (ref 0–37)
Albumin: 4 g/dL (ref 3.5–5.2)
Alkaline Phosphatase: 29 U/L — ABNORMAL LOW (ref 39–117)
Potassium: 4.3 mEq/L (ref 3.5–5.1)
Sodium: 140 mEq/L (ref 135–145)
Total Bilirubin: 0.8 mg/dL (ref 0.3–1.2)
Total Protein: 6.6 g/dL (ref 6.0–8.3)

## 2012-07-24 LAB — TSH: TSH: 2.31 u[IU]/mL (ref 0.35–5.50)

## 2012-07-24 LAB — URINALYSIS, ROUTINE W REFLEX MICROSCOPIC
Hgb urine dipstick: NEGATIVE
Ketones, ur: NEGATIVE
Leukocytes, UA: NEGATIVE
Specific Gravity, Urine: 1.01 (ref 1.000–1.030)
Urobilinogen, UA: 0.2 (ref 0.0–1.0)

## 2012-07-24 NOTE — Assessment & Plan Note (Signed)
He is doing well on lipitor and zetia

## 2012-07-24 NOTE — Progress Notes (Signed)
Subjective:    Patient ID: Andrew [name], male    DOB: 03-04-1928, 78 y.o.   MRN: [id number]  Diabetes He presents for his follow-up diabetic visit. He has type 2 diabetes mellitus. His disease course has been stable. Hypoglycemia symptoms include confusion, hunger and sweats. Pertinent negatives for hypoglycemia include no nervousness/anxiousness or pallor. Pertinent negatives for diabetes include no blurred vision, no chest pain, no fatigue, no foot paresthesias, no foot ulcerations, no polydipsia, no polyphagia, no polyuria, no visual change, no weakness and no weight loss. Hypoglycemia complications include nocturnal hypoglycemia. Diabetic complications include heart disease. Current diabetic treatment includes oral agent (monotherapy). He is compliant with treatment all of the time. His weight is stable. He is following a generally healthy diet. Meal planning includes avoidance of concentrated sweets. He has not had a previous visit with a dietician. He participates in exercise intermittently. His home blood glucose trend is decreasing steadily. An ACE inhibitor/angiotensin II receptor blocker is being taken. He does not see a podiatrist.Eye exam is current.      Review of Systems  Constitutional: Negative for fever, chills, weight loss, diaphoresis, activity change, appetite change, fatigue and unexpected weight change.  HENT: Negative.   Eyes: Negative.  Negative for blurred vision.  Respiratory: Negative for cough, chest tightness, shortness of breath, wheezing and stridor.   Cardiovascular: Negative for chest pain, palpitations and leg swelling.  Gastrointestinal: Negative for nausea, vomiting, abdominal pain, diarrhea, constipation and anal bleeding.  Genitourinary: Negative.  Negative for polyuria.  Musculoskeletal: Negative for myalgias, back pain, joint swelling, arthralgias and gait problem.  Skin: Negative for color change, pallor, rash and wound.  Neurological: Negative.   Negative for weakness.  Hematological: Negative for polydipsia, polyphagia and adenopathy. Does not bruise/bleed easily.  Psychiatric/Behavioral: Positive for confusion and decreased concentration. Negative for suicidal ideas, hallucinations, behavioral problems, disturbed wake/sleep cycle, self-injury, dysphoric mood and agitation. The patient is not nervous/anxious and is not hyperactive.        Objective:   Physical Exam  Vitals reviewed. Constitutional: He is oriented to person, place, and time. He appears well-developed and well-nourished. No distress.  HENT:  Head: Normocephalic and atraumatic.  Mouth/Throat: Oropharynx is clear and moist. No oropharyngeal exudate.  Eyes: Conjunctivae are normal. Right eye exhibits no discharge. Left eye exhibits no discharge. No scleral icterus.  Neck: Normal range of motion. Neck supple. No JVD present. No tracheal deviation present. No thyromegaly present.  Cardiovascular: Normal rate, regular rhythm, normal heart sounds and intact distal pulses.  Exam reveals no gallop and no friction rub.   No murmur heard. Pulmonary/Chest: Effort normal and breath sounds normal. No stridor. No respiratory distress. He has no wheezes. He has no rales. He exhibits no tenderness.  Abdominal: Soft. Bowel sounds are normal. He exhibits no distension and no mass. There is no tenderness. There is no rebound and no guarding.  Musculoskeletal: Normal range of motion. He exhibits no edema and no tenderness.  Lymphadenopathy:    He has no cervical adenopathy.  Neurological: He is oriented to person, place, and time.  Skin: Skin is warm and dry. No rash noted. He is not diaphoretic. No erythema. No pallor.  Psychiatric: He has a normal mood and affect. His behavior is normal. Judgment and thought content normal.      Lab Results  Component Value Date   WBC 6.7 03/19/2012   HGB 12.4* 03/19/2012   HCT 37.5* 03/19/2012   PLT 197.0 03/19/2012   GLUCOSE 60* 03/19/2012  CHOL 101 01/16/2012   TRIG 49.0 01/16/2012   HDL 43.10 01/16/2012   LDLDIRECT 78.6 03/18/2010   LDLCALC 48 01/16/2012   ALT 28 03/19/2012   AST 29 03/19/2012   NA 141 03/19/2012   K 3.9 03/19/2012   CL 106 03/19/2012   CREATININE 1.4 03/19/2012   BUN 26* 03/19/2012   CO2 27 03/19/2012   TSH 2.06 03/19/2012   PSA 2.51 04/12/2011   INR 1.0 12/10/2008   HGBA1C 6.0 03/19/2012   MICROALBUR 45.5* 03/18/2010      Assessment & Plan:

## 2012-07-24 NOTE — Assessment & Plan Note (Signed)
I will check his TSH today 

## 2012-07-24 NOTE — Assessment & Plan Note (Signed)
His A1C is low and her reports episodes of hypoglycemia so I have asked hi to stop taking glipizide, his renal function is stable

## 2012-07-24 NOTE — Assessment & Plan Note (Signed)
His BP is well controlled 

## 2012-07-24 NOTE — Assessment & Plan Note (Signed)
Repeat CBC today 

## 2012-07-24 NOTE — Patient Instructions (Signed)

## 2012-08-22 ENCOUNTER — Other Ambulatory Visit: Payer: Self-pay | Admitting: Dermatology

## 2012-09-06 ENCOUNTER — Ambulatory Visit: Payer: Medicare Other | Admitting: Neurosurgery

## 2012-09-13 ENCOUNTER — Encounter: Payer: Self-pay | Admitting: Neurosurgery

## 2012-09-14 ENCOUNTER — Encounter (INDEPENDENT_AMBULATORY_CARE_PROVIDER_SITE_OTHER): Payer: Medicare Other | Admitting: *Deleted

## 2012-09-14 ENCOUNTER — Encounter: Payer: Self-pay | Admitting: Neurosurgery

## 2012-09-14 ENCOUNTER — Ambulatory Visit (INDEPENDENT_AMBULATORY_CARE_PROVIDER_SITE_OTHER): Payer: Medicare Other | Admitting: Neurosurgery

## 2012-09-14 VITALS — BP 137/77 | HR 78 | Resp 16 | Ht 67.0 in | Wt 187.0 lb

## 2012-09-14 DIAGNOSIS — I739 Peripheral vascular disease, unspecified: Secondary | ICD-10-CM | POA: Insufficient documentation

## 2012-09-14 DIAGNOSIS — Z48812 Encounter for surgical aftercare following surgery on the circulatory system: Secondary | ICD-10-CM

## 2012-09-14 DIAGNOSIS — I70219 Atherosclerosis of native arteries of extremities with intermittent claudication, unspecified extremity: Secondary | ICD-10-CM | POA: Insufficient documentation

## 2012-09-14 NOTE — Progress Notes (Signed)
[facility] Office Note  CC: PVD surveillance Referring Physician: Kellie Simmering  History of Present Illness: 78 year old male patient of Dr. Kellie Simmering status post AAA resection left renal artery bypass graft 1997 by Dr. Amedeo Plenty. The patient denies claudication, rest pain, ulcerations on the lower extremities. The patient has no unusual abdominal or back pain and denies any new medical diagnoses or recent surgery. The patient states he is able to do yard work and walk without difficulty and feels like he is doing quite well at this point.  Past Medical History  Diagnosis Date  . CAD (coronary artery disease)   . LVF (left ventricular failure)   . Carotid artery stenosis   . Hyperlipidemia   . Hypertension   . GERD (gastroesophageal reflux disease)   . IBS (irritable bowel syndrome)   . Arthritis   . Diabetes mellitus   . Thyroid disease   . Anemia   . Vitamin d deficiency   . Depression   . Fatty liver 2008  . Cancer     Melanoma - Back    ROS: [x]  Positive   [ ]  Denies    General: [ ]  Weight loss, [ ]  Fever, [ ]  chills Neurologic: [ ]  Dizziness, [ ]  Blackouts, [ ]  Seizure [ ]  Stroke, [ ]  "Mini stroke", [ ]  Slurred speech, [ ]  Temporary blindness; [ ]  weakness in arms or legs, [ ]  Hoarseness Cardiac: [ ]  Chest pain/pressure, [ ]  Shortness of breath at rest [ ]  Shortness of breath with exertion, [ ]  Atrial fibrillation or irregular heartbeat Vascular: [ ]  Pain in legs with walking, [ ]  Pain in legs at rest, [ ]  Pain in legs at night,  [ ]  Non-healing ulcer, [ ]  Blood clot in vein/DVT,   Pulmonary: [ ]  Home oxygen, [ ]  Productive cough, [ ]  Coughing up blood, [ ]  Asthma,  [ ]  Wheezing Musculoskeletal:  [ ]  Arthritis, [ ]  Low back pain, [ ]  Joint pain Hematologic: [ ]  Easy Bruising, [ ]  Anemia; [ ]  Hepatitis Gastrointestinal: [ ]  Blood in stool, [ ]  Gastroesophageal Reflux/heartburn, [ ]  Trouble swallowing Urinary: [ ]  chronic Kidney disease, [ ]  on HD  - [ ]  MWF or [ ]  TTHS, [ ]  Burning with urination, [ ]  Difficulty urinating Skin: [ ]  Rashes, [ ]  Wounds Psychological: [ ]  Anxiety, [ ]  Depression   Social History History  Substance Use Topics  . 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 Smokeless tobacco: Never Used   Comment: quit atleast 25 yrs ago, per pt  . Alcohol Use: 1.8 oz/week    3 Shots of liquor per week     socially    Family History Family History  Problem Relation Age of Onset  . Colon cancer Father   . Coronary artery disease Brother   . Diabetes Brother   . Cancer Brother     throat and abdominal    Allergies  Allergen Reactions  . Metoprolol     Heart starts racing. Shallow breathing     Current Outpatient Prescriptions  Medication Sig Dispense Refill  . ALPRAZolam (XANAX) 0.5 MG tablet Take 1 tablet (0.5 mg total) by mouth 3 (three) times daily as needed for sleep.  90 tablet  5  . aspirin 325 MG tablet Take 325 mg by mouth daily.        Marland Kitchen atorvastatin (LIPITOR) 80 MG tablet TAKE 1 TABLET DAILY  90 tablet  2  . CELEBREX 200 MG  capsule TAKE 1 CAPSULE TWICE DAILY  180 capsule  2  . cilostazol (PLETAL) 100 MG tablet TAKE 1 TABLET TWICE A DAY  180 tablet  2  . clopidogrel (PLAVIX) 75 MG tablet Take 1 tablet (75 mg total) by mouth daily.  90 tablet  3  . DIOVAN 80 MG tablet TAKE 1 TABLET DAILY  90 tablet  2  . fenofibrate (TRICOR) 145 MG tablet Take 1 tablet (145 mg total) by mouth daily.  90 tablet  3  . fish oil-omega-3 fatty acids 1000 MG capsule Take 2 g by mouth daily.        Marland Kitchen FLUoxetine (PROZAC) 10 MG capsule TAKE 1 CAPSULE DAILY  90 capsule  2  . furosemide (LASIX) 40 MG tablet TAKE 1 TABLET DAILY  90 tablet  1  . glucosamine-chondroitin 500-400 MG tablet Take 1 tablet by mouth 2 (two) times daily.        Marland Kitchen glucose blood test strip 1 each by Other route as needed. Use as instructed       . Misc Natural Products (NF FORMULAS TESTOSTERONE) CAPS Take 2 capsules by mouth daily.      Marland Kitchen NIFEDICAL XL 30 MG 24  hr tablet TAKE 1 TABLET DAILY  90 tablet  2  . nitroGLYCERIN (NITROSTAT) 0.4 MG SL tablet Place 0.4 mg under the tongue every 5 (five) minutes as needed.        . Tamsulosin HCl (FLOMAX) 0.4 MG CAPS Take 1 capsule (0.4 mg total) by mouth daily.  30 capsule  11  . temazepam (RESTORIL) 30 MG capsule Take 1 capsule (30 mg total) by mouth at bedtime as needed.  30 capsule  5  . ZETIA 10 MG tablet TAKE 1 TABLET DAILY  90 tablet  2    Physical Examination  Filed Vitals:   09/14/12 1039  BP: 137/77  Pulse: 78  Resp: 16    Body mass index is 29.29 kg/(m^2).  General:  WDWN in NAD Gait: Normal HEENT: WNL Eyes: Pupils equal Pulmonary: normal non-labored breathing , without Rales, rhonchi,  wheezing Cardiac: RRR, without  Murmurs, rubs or gallops; No carotid bruits Abdomen: soft, NT, no masses Skin: no rashes, ulcers noted Vascular Exam/Pulses: Palpable femoral pulses bilaterally, no carotid bruits are heard, lower extremities are pink and well-perfused  Extremities without ischemic changes, no Gangrene , no cellulitis; no open wounds;  Musculoskeletal: no muscle wasting or atrophy  Neurologic: A&O X 3; Appropriate Affect ; SENSATION: normal; MOTOR FUNCTION:  moving all extremities equally. Speech is fluent/normal  Non-Invasive Vascular Imaging: ABIs today are 0.94 and biphasic on the right, 1.01 biphasic on the left  ASSESSMENT/PLAN: Asymptomatic patient with no complaints of claudication. The patient will followup here in one year with repeat ABI study, he is in agreement with this plan, his questions were encouraged and answered.  Beatris Ship ANP  Clinic M.D.: Bridgett Larsson

## 2012-09-14 NOTE — Addendum Note (Signed)
Addended by: Mena Goes on: 09/14/2012 02:45 PM   Modules accepted: Orders

## 2012-10-08 ENCOUNTER — Other Ambulatory Visit: Payer: Self-pay | Admitting: Internal Medicine

## 2012-10-23 ENCOUNTER — Other Ambulatory Visit: Payer: Self-pay | Admitting: Internal Medicine

## 2012-10-25 ENCOUNTER — Other Ambulatory Visit: Payer: Self-pay | Admitting: Internal Medicine

## 2012-11-23 ENCOUNTER — Other Ambulatory Visit: Payer: Self-pay | Admitting: Internal Medicine

## 2012-11-28 ENCOUNTER — Other Ambulatory Visit: Payer: Self-pay | Admitting: Internal Medicine

## 2012-12-24 ENCOUNTER — Other Ambulatory Visit: Payer: Self-pay

## 2012-12-24 MED ORDER — TAMSULOSIN HCL 0.4 MG PO CAPS: 0.4000 mg | ORAL_CAPSULE | Freq: Every day | ORAL | Status: DC

## 2012-12-26 DIAGNOSIS — J189 Pneumonia, unspecified organism: Secondary | ICD-10-CM

## 2012-12-26 HISTORY — DX: Pneumonia, unspecified organism: J18.9

## 2012-12-31 ENCOUNTER — Other Ambulatory Visit: Payer: Self-pay

## 2012-12-31 MED ORDER — TAMSULOSIN HCL 0.4 MG PO CAPS: 0.4000 mg | ORAL_CAPSULE | Freq: Every day | ORAL | Status: DC

## 2013-01-23 ENCOUNTER — Encounter: Payer: Self-pay | Admitting: Internal Medicine

## 2013-01-23 ENCOUNTER — Ambulatory Visit (INDEPENDENT_AMBULATORY_CARE_PROVIDER_SITE_OTHER): Payer: Medicare PPO | Admitting: Internal Medicine

## 2013-01-23 ENCOUNTER — Other Ambulatory Visit (INDEPENDENT_AMBULATORY_CARE_PROVIDER_SITE_OTHER): Payer: Medicare PPO

## 2013-01-23 VITALS — BP 94/52 | HR 92 | Temp 97.6°F | Resp 16 | Wt 197.0 lb

## 2013-01-23 DIAGNOSIS — R10815 Periumbilic abdominal tenderness: Secondary | ICD-10-CM

## 2013-01-23 DIAGNOSIS — D510 Vitamin B12 deficiency anemia due to intrinsic factor deficiency: Secondary | ICD-10-CM

## 2013-01-23 DIAGNOSIS — I10 Essential (primary) hypertension: Secondary | ICD-10-CM

## 2013-01-23 DIAGNOSIS — E039 Hypothyroidism, unspecified: Secondary | ICD-10-CM

## 2013-01-23 DIAGNOSIS — E1165 Type 2 diabetes mellitus with hyperglycemia: Secondary | ICD-10-CM

## 2013-01-23 DIAGNOSIS — E118 Type 2 diabetes mellitus with unspecified complications: Secondary | ICD-10-CM

## 2013-01-23 LAB — COMPREHENSIVE METABOLIC PANEL
Albumin: 3.9 g/dL (ref 3.5–5.2)
Alkaline Phosphatase: 27 U/L — ABNORMAL LOW (ref 39–117)
BUN: 25 mg/dL — ABNORMAL HIGH (ref 6–23)
CO2: 28 mEq/L (ref 19–32)
GFR: 41.75 mL/min — ABNORMAL LOW (ref >60.00)
Glucose, Bld: 96 mg/dL (ref 70–99)
Potassium: 4.1 mEq/L (ref 3.5–5.1)
Total Bilirubin: 0.7 mg/dL (ref 0.3–1.2)
Total Protein: 6.5 g/dL (ref 6.0–8.3)

## 2013-01-23 LAB — URINALYSIS, ROUTINE W REFLEX MICROSCOPIC
Bilirubin Urine: NEGATIVE
Hgb urine dipstick: NEGATIVE
Leukocytes, UA: NEGATIVE
Nitrite: NEGATIVE
Urobilinogen, UA: 0.2 (ref 0.0–1.0)
pH: 6 (ref 5.0–8.0)

## 2013-01-23 LAB — CBC WITH DIFFERENTIAL/PLATELET
Basophils Relative: 0.6 % (ref 0.0–3.0)
Eosinophils Relative: 4.1 % (ref 0.0–5.0)
HCT: 34 % — ABNORMAL LOW (ref 39.0–52.0)
Hemoglobin: 11.6 g/dL — ABNORMAL LOW (ref 13.0–17.0)
Lymphs Abs: 1.3 10*3/uL (ref 0.7–4.0)
MCV: 95.9 fl (ref 78.0–100.0)
Monocytes Absolute: 0.6 10*3/uL (ref 0.1–1.0)
Monocytes Relative: 9.1 % (ref 3.0–12.0)
RBC: 3.55 Mil/uL — ABNORMAL LOW (ref 4.22–5.81)
WBC: 6 10*3/uL (ref 4.5–10.5)

## 2013-01-23 LAB — TSH: TSH: 2.38 u[IU]/mL (ref 0.35–5.50)

## 2013-01-23 LAB — LIPID PANEL
LDL Cholesterol: 30 mg/dL (ref 0–99)
Total CHOL/HDL Ratio: 3
VLDL: 28 mg/dL (ref 0.0–40.0)

## 2013-01-23 LAB — AMYLASE: Amylase: 65 U/L (ref 27–131)

## 2013-01-23 NOTE — Progress Notes (Signed)
Subjective:    Patient ID: Andrew [name], male    DOB: [birth year], 79 y.o.   MRN: [id number]  Abdominal Pain This is a recurrent problem. Episode onset: 3 months. The onset quality is gradual. The problem occurs intermittently. The most recent episode lasted 3 months. The problem has been unchanged. The pain is located in the suprapubic region. The pain is at a severity of 1/10. The pain is mild. The quality of the pain is aching. The abdominal pain does not radiate. Pertinent negatives include no anorexia, arthralgias, belching, constipation, diarrhea, dysuria, fever, flatus, frequency, headaches, hematochezia, hematuria, melena, myalgias, nausea, vomiting or weight loss. Nothing aggravates the pain. The pain is relieved by nothing. He has tried nothing for the symptoms. The treatment provided no relief.      Review of Systems  Constitutional: Negative for fever, chills, weight loss, diaphoresis, activity change, appetite change, fatigue and unexpected weight change.  HENT: Negative.   Eyes: Negative.   Respiratory: Negative for cough, chest tightness, shortness of breath, wheezing and stridor.   Cardiovascular: Negative for chest pain, palpitations and leg swelling.  Gastrointestinal: Positive for abdominal pain. Negative for nausea, vomiting, diarrhea, constipation, blood in stool, melena, hematochezia, abdominal distention, anal bleeding, rectal pain, anorexia and flatus.  Genitourinary: Negative for dysuria, urgency, frequency, hematuria, flank pain, decreased urine volume, discharge, penile swelling, scrotal swelling, enuresis, difficulty urinating, genital sores, penile pain and testicular pain.  Musculoskeletal: Negative for myalgias, back pain, joint swelling, arthralgias and gait problem.  Skin: Negative.   Neurological: Negative for dizziness, seizures, weakness, light-headedness and headaches.  Hematological: Negative for adenopathy. Does not bruise/bleed easily.    Psychiatric/Behavioral: Negative.        Objective:   Physical Exam  Vitals reviewed. Constitutional: He is oriented to person, place, and time. He appears well-developed and well-nourished.  Non-toxic appearance. He does not have a sickly appearance. He does not appear ill. No distress.  HENT:  Head: Normocephalic and atraumatic.  Mouth/Throat: Oropharynx is clear and moist. No oropharyngeal exudate.  Eyes: Conjunctivae normal are normal. Right eye exhibits no discharge. Left eye exhibits no discharge. No scleral icterus.  Neck: Normal range of motion. Neck supple. No JVD present. No tracheal deviation present. No thyromegaly present.  Cardiovascular: Normal rate, regular rhythm, normal heart sounds and intact distal pulses.  Exam reveals no gallop and no friction rub.   No murmur heard. Pulmonary/Chest: Effort normal and breath sounds normal. No stridor. No respiratory distress. He has no wheezes. He has no rales. He exhibits no tenderness.  Abdominal: Soft. Normal appearance and bowel sounds are normal. He exhibits no shifting dullness, no distension, no pulsatile liver, no fluid wave, no abdominal bruit, no ascites, no pulsatile midline mass and no mass. There is no hepatosplenomegaly, splenomegaly or hepatomegaly. There is no tenderness. There is no rigidity, no rebound, no guarding, no CVA tenderness, no tenderness at McBurney's point and negative Murphy's sign. A hernia is present. Hernia confirmed positive in the ventral area. Hernia confirmed negative in the right inguinal area and confirmed negative in the left inguinal area.  Genitourinary: Rectum normal, prostate normal, testes normal and penis normal. Rectal exam shows no external hemorrhoid, no internal hemorrhoid, no fissure, no mass, no tenderness and anal tone normal. Guaiac negative stool. Prostate is not enlarged and not tender. Right testis shows no mass, no swelling and no tenderness. Right testis is descended. Left testis  shows no mass, no swelling and no tenderness. Left testis is descended. Uncircumcised. No  phimosis, paraphimosis, hypospadias, penile erythema or penile tenderness. No discharge found.  Musculoskeletal: Normal range of motion. He exhibits no edema and no tenderness.  Lymphadenopathy:    He has no cervical adenopathy.       Right: No inguinal adenopathy present.       Left: No inguinal adenopathy present.  Neurological: He is oriented to person, place, and time.  Skin: Skin is warm and dry. No rash noted. He is not diaphoretic. No erythema. No pallor.  Psychiatric: He has a normal mood and affect. His behavior is normal. Judgment and thought content normal.     Lab Results  Component Value Date   WBC 5.4 07/24/2012   HGB 11.7* 07/24/2012   HCT 34.9* 07/24/2012   PLT 175.0 07/24/2012   GLUCOSE 75 07/24/2012   CHOL 101 01/16/2012   TRIG 49.0 01/16/2012   HDL 43.10 01/16/2012   LDLDIRECT 78.6 03/18/2010   LDLCALC 48 01/16/2012   ALT 36 07/24/2012   AST 36 07/24/2012   NA 140 07/24/2012   K 4.3 07/24/2012   CL 104 07/24/2012   CREATININE 1.4 07/24/2012   BUN 25* 07/24/2012   CO2 29 07/24/2012   TSH 2.31 07/24/2012   PSA 2.51 04/12/2011   INR 1.0 12/10/2008   HGBA1C 5.7 07/24/2012   MICROALBUR 45.5* 03/18/2010       Assessment & Plan:

## 2013-01-23 NOTE — Patient Instructions (Signed)
Abdominal Pain  Abdominal pain can be caused by many things. Your caregiver decides the seriousness of your pain by an examination and possibly blood tests and X-rays. Many cases can be observed and treated at home. Most abdominal pain is not caused by a disease and will probably improve without treatment. However, in many cases, more time must pass before a clear cause of the pain can be found. Before that point, it may not be known if you need more testing, or if hospitalization or surgery is needed.  HOME CARE INSTRUCTIONS   · Do not take laxatives unless directed by your caregiver.  · Take pain medicine only as directed by your caregiver.  · Only take over-the-counter or prescription medicines for pain, discomfort, or fever as directed by your caregiver.  · Try a clear liquid diet (broth, tea, or water) for as long as directed by your caregiver. Slowly move to a bland diet as tolerated.  SEEK IMMEDIATE MEDICAL CARE IF:   · The pain does not go away.  · You have a fever.  · You keep throwing up (vomiting).  · The pain is felt only in portions of the abdomen. Pain in the right side could possibly be appendicitis. In an adult, pain in the left lower portion of the abdomen could be colitis or diverticulitis.  · You pass bloody or black tarry stools.  MAKE SURE YOU:   · Understand these instructions.  · Will watch your condition.  · Will get help right away if you are not doing well or get worse.  Document Released: 09/21/2005 Document Revised: 03/05/2012 Document Reviewed: 07/30/2008  [employer]® Patient Information ©2013 [employer].

## 2013-01-24 ENCOUNTER — Encounter: Payer: Self-pay | Admitting: Internal Medicine

## 2013-01-24 ENCOUNTER — Ambulatory Visit (INDEPENDENT_AMBULATORY_CARE_PROVIDER_SITE_OTHER)
Admission: RE | Admit: 2013-01-24 | Discharge: 2013-01-24 | Disposition: A | Discharge status: Home / Self Care | Payer: Medicare PPO | Source: Ambulatory Visit | Attending: Internal Medicine | Admitting: Internal Medicine

## 2013-01-24 DIAGNOSIS — R10815 Periumbilic abdominal tenderness: Secondary | ICD-10-CM

## 2013-01-24 NOTE — Assessment & Plan Note (Signed)
I will check his TSH and will adjust his dose if needed 

## 2013-01-24 NOTE — Assessment & Plan Note (Addendum)
I will check his labs to see if there is am explanation for his pain I will get a plain film done to see if he has stool retention, SBO, kidney stone, etc

## 2013-01-24 NOTE — Assessment & Plan Note (Signed)
His BP is well controlled 

## 2013-01-24 NOTE — Assessment & Plan Note (Signed)
I will check his a1c and will treat if it is indicated

## 2013-02-14 ENCOUNTER — Other Ambulatory Visit: Payer: Self-pay | Admitting: *Deleted

## 2013-02-14 ENCOUNTER — Other Ambulatory Visit (INDEPENDENT_AMBULATORY_CARE_PROVIDER_SITE_OTHER): Payer: Medicare PPO

## 2013-02-14 DIAGNOSIS — E118 Type 2 diabetes mellitus with unspecified complications: Secondary | ICD-10-CM

## 2013-02-14 DIAGNOSIS — I10 Essential (primary) hypertension: Secondary | ICD-10-CM

## 2013-02-14 LAB — COMPREHENSIVE METABOLIC PANEL
ALT: 19 U/L (ref 0–53)
AST: 24 U/L (ref 0–37)
CO2: 28 mEq/L (ref 19–32)
Creatinine, Ser: 1.5 mg/dL (ref 0.4–1.5)
GFR: 48.32 mL/min — ABNORMAL LOW (ref >60.00)
Sodium: 139 mEq/L (ref 135–145)
Total Bilirubin: 0.5 mg/dL (ref 0.3–1.2)
Total Protein: 6.7 g/dL (ref 6.0–8.3)

## 2013-02-14 LAB — HEMOGLOBIN A1C: Hgb A1c MFr Bld: 6.8 % — ABNORMAL HIGH (ref 4.6–6.5)

## 2013-02-15 ENCOUNTER — Telehealth: Payer: Self-pay | Admitting: Internal Medicine

## 2013-02-15 MED ORDER — ATORVASTATIN CALCIUM 80 MG PO TABS: 80.0000 mg | ORAL_TABLET | Freq: Every day | ORAL | Status: DC

## 2013-02-15 NOTE — Telephone Encounter (Signed)
Patient can no longer use [facility] due to his insurance and he needs a refill on his atorvastatin sent to [facility]

## 2013-02-16 ENCOUNTER — Telehealth: Payer: Self-pay

## 2013-02-16 NOTE — Telephone Encounter (Signed)
Please advise if ok to refill alprazolam 0.5 TID. Medication last filled 07/06/12 Thanks

## 2013-02-18 NOTE — Telephone Encounter (Signed)
yes

## 2013-02-19 MED ORDER — ALPRAZOLAM 0.5 MG PO TABS: 0.5000 mg | ORAL_TABLET | Freq: Three times a day (TID) | ORAL | Status: DC | PRN

## 2013-03-05 ENCOUNTER — Telehealth: Payer: Self-pay | Admitting: Internal Medicine

## 2013-03-05 DIAGNOSIS — I739 Peripheral vascular disease, unspecified: Secondary | ICD-10-CM

## 2013-03-05 DIAGNOSIS — M129 Arthropathy, unspecified: Secondary | ICD-10-CM

## 2013-03-05 DIAGNOSIS — S39012S Strain of muscle, fascia and tendon of lower back, sequela: Secondary | ICD-10-CM

## 2013-03-05 DIAGNOSIS — I70219 Atherosclerosis of native arteries of extremities with intermittent claudication, unspecified extremity: Secondary | ICD-10-CM

## 2013-03-05 NOTE — Telephone Encounter (Signed)
Caller: Cadence/Patient; Phone: [phone]; Reason for Call: [facility] no longer serving [place].  Pt would like Cilostazol and Celebrex refills sent to [facility], [phone].  Pt last seen on 01-23-13.  PLEASE REVIEW W/ MD FOR REFILL REQUEST.

## 2013-03-06 MED ORDER — CILOSTAZOL 100 MG PO TABS: 100.0000 mg | ORAL_TABLET | Freq: Two times a day (BID) | ORAL | Status: DC

## 2013-03-06 MED ORDER — CELECOXIB 200 MG PO CAPS: 200.0000 mg | ORAL_CAPSULE | Freq: Every day | ORAL | Status: DC

## 2013-03-06 NOTE — Telephone Encounter (Signed)
Notified pt rx sen to [place]...Andrew [name]

## 2013-03-06 NOTE — Telephone Encounter (Signed)
done

## 2013-03-14 ENCOUNTER — Telehealth: Payer: Self-pay

## 2013-03-14 DIAGNOSIS — M129 Arthropathy, unspecified: Secondary | ICD-10-CM

## 2013-03-14 DIAGNOSIS — S39012S Strain of muscle, fascia and tendon of lower back, sequela: Secondary | ICD-10-CM

## 2013-03-14 MED ORDER — CELECOXIB 200 MG PO CAPS: 200.0000 mg | ORAL_CAPSULE | Freq: Two times a day (BID) | ORAL | Status: DC

## 2013-03-14 NOTE — Telephone Encounter (Signed)
Received fax from pharmacy regarding rx for celebrex 200 mg qd, per pt he states bid, however Epic does not reflect that. Please advise if ok Thanks

## 2013-03-14 NOTE — Telephone Encounter (Signed)
done

## 2013-03-19 ENCOUNTER — Other Ambulatory Visit: Payer: Self-pay

## 2013-03-19 DIAGNOSIS — S39012S Strain of muscle, fascia and tendon of lower back, sequela: Secondary | ICD-10-CM

## 2013-03-19 DIAGNOSIS — M129 Arthropathy, unspecified: Secondary | ICD-10-CM

## 2013-03-19 MED ORDER — CLOPIDOGREL BISULFATE 75 MG PO TABS: 75.0000 mg | ORAL_TABLET | Freq: Every day | ORAL | Status: DC

## 2013-03-19 MED ORDER — FENOFIBRATE 145 MG PO TABS: 145.0000 mg | ORAL_TABLET | Freq: Every day | ORAL | Status: DC

## 2013-03-19 MED ORDER — CELECOXIB 200 MG PO CAPS: 200.0000 mg | ORAL_CAPSULE | Freq: Two times a day (BID) | ORAL | Status: DC

## 2013-03-19 NOTE — Telephone Encounter (Signed)
Received fax from [facility] requesting fluoxetine, plavix, tricor (90 day), no longer wants mail order

## 2013-03-20 ENCOUNTER — Other Ambulatory Visit: Payer: Self-pay | Admitting: Internal Medicine

## 2013-03-20 DIAGNOSIS — I2510 Atherosclerotic heart disease of native coronary artery without angina pectoris: Secondary | ICD-10-CM

## 2013-03-20 DIAGNOSIS — F341 Dysthymic disorder: Secondary | ICD-10-CM

## 2013-03-20 DIAGNOSIS — E785 Hyperlipidemia, unspecified: Secondary | ICD-10-CM

## 2013-03-20 MED ORDER — FLUOXETINE HCL 10 MG PO CAPS
10.0000 mg | ORAL_CAPSULE | Freq: Every day | ORAL | Status: DC
Start: 2013-03-20 — End: 2014-04-18

## 2013-03-20 MED ORDER — CLOPIDOGREL BISULFATE 75 MG PO TABS: 75.0000 mg | ORAL_TABLET | Freq: Every day | ORAL | Status: AC

## 2013-03-20 MED ORDER — FENOFIBRATE 145 MG PO TABS: 145.0000 mg | ORAL_TABLET | Freq: Every day | ORAL | Status: DC

## 2013-03-27 ENCOUNTER — Telehealth: Payer: Self-pay

## 2013-03-27 NOTE — Telephone Encounter (Signed)
[place] would like to know if patient should be taking both clopidogrel 75 mg and Pletel. Please advise

## 2013-03-28 ENCOUNTER — Encounter (HOSPITAL_COMMUNITY): Payer: Self-pay

## 2013-03-28 ENCOUNTER — Inpatient Hospital Stay (HOSPITAL_COMMUNITY): Payer: Medicare PPO

## 2013-03-28 ENCOUNTER — Emergency Department (HOSPITAL_COMMUNITY): Payer: Medicare PPO

## 2013-03-28 ENCOUNTER — Inpatient Hospital Stay (HOSPITAL_COMMUNITY)
Admission: EM | Admit: 2013-03-28 | Discharge: 2013-03-30 | DRG: 193 | Disposition: A | Discharge status: Home / Self Care | Payer: Medicare PPO | Attending: Internal Medicine | Admitting: Internal Medicine

## 2013-03-28 DIAGNOSIS — J9601 Acute respiratory failure with hypoxia: Secondary | ICD-10-CM

## 2013-03-28 DIAGNOSIS — E785 Hyperlipidemia, unspecified: Secondary | ICD-10-CM

## 2013-03-28 DIAGNOSIS — D696 Thrombocytopenia, unspecified: Secondary | ICD-10-CM | POA: Diagnosis present

## 2013-03-28 DIAGNOSIS — R0902 Hypoxemia: Secondary | ICD-10-CM

## 2013-03-28 DIAGNOSIS — Z87891 Personal history of nicotine dependence: Secondary | ICD-10-CM

## 2013-03-28 DIAGNOSIS — N181 Chronic kidney disease, stage 1: Secondary | ICD-10-CM | POA: Diagnosis present

## 2013-03-28 DIAGNOSIS — I714 Abdominal aortic aneurysm, without rupture, unspecified: Secondary | ICD-10-CM | POA: Diagnosis present

## 2013-03-28 DIAGNOSIS — K7689 Other specified diseases of liver: Secondary | ICD-10-CM | POA: Diagnosis present

## 2013-03-28 DIAGNOSIS — E039 Hypothyroidism, unspecified: Secondary | ICD-10-CM | POA: Diagnosis present

## 2013-03-28 DIAGNOSIS — Z79899 Other long term (current) drug therapy: Secondary | ICD-10-CM

## 2013-03-28 DIAGNOSIS — F411 Generalized anxiety disorder: Secondary | ICD-10-CM | POA: Diagnosis present

## 2013-03-28 DIAGNOSIS — I129 Hypertensive chronic kidney disease with stage 1 through stage 4 chronic kidney disease, or unspecified chronic kidney disease: Secondary | ICD-10-CM | POA: Diagnosis present

## 2013-03-28 DIAGNOSIS — E1165 Type 2 diabetes mellitus with hyperglycemia: Secondary | ICD-10-CM | POA: Diagnosis present

## 2013-03-28 DIAGNOSIS — I10 Essential (primary) hypertension: Secondary | ICD-10-CM

## 2013-03-28 DIAGNOSIS — Z96659 Presence of unspecified artificial knee joint: Secondary | ICD-10-CM

## 2013-03-28 DIAGNOSIS — F3289 Other specified depressive episodes: Secondary | ICD-10-CM | POA: Diagnosis present

## 2013-03-28 DIAGNOSIS — I2510 Atherosclerotic heart disease of native coronary artery without angina pectoris: Secondary | ICD-10-CM

## 2013-03-28 DIAGNOSIS — I152 Hypertension secondary to endocrine disorders: Secondary | ICD-10-CM | POA: Diagnosis present

## 2013-03-28 DIAGNOSIS — F341 Dysthymic disorder: Secondary | ICD-10-CM

## 2013-03-28 DIAGNOSIS — I70209 Unspecified atherosclerosis of native arteries of extremities, unspecified extremity: Secondary | ICD-10-CM | POA: Diagnosis present

## 2013-03-28 DIAGNOSIS — F329 Major depressive disorder, single episode, unspecified: Secondary | ICD-10-CM | POA: Diagnosis present

## 2013-03-28 DIAGNOSIS — D649 Anemia, unspecified: Secondary | ICD-10-CM

## 2013-03-28 DIAGNOSIS — R609 Edema, unspecified: Secondary | ICD-10-CM

## 2013-03-28 DIAGNOSIS — E118 Type 2 diabetes mellitus with unspecified complications: Secondary | ICD-10-CM

## 2013-03-28 DIAGNOSIS — I517 Cardiomegaly: Secondary | ICD-10-CM | POA: Diagnosis present

## 2013-03-28 DIAGNOSIS — K219 Gastro-esophageal reflux disease without esophagitis: Secondary | ICD-10-CM

## 2013-03-28 DIAGNOSIS — R6510 Systemic inflammatory response syndrome (SIRS) of non-infectious origin without acute organ dysfunction: Secondary | ICD-10-CM

## 2013-03-28 DIAGNOSIS — IMO0002 Reserved for concepts with insufficient information to code with codable children: Secondary | ICD-10-CM

## 2013-03-28 DIAGNOSIS — N179 Acute kidney failure, unspecified: Secondary | ICD-10-CM | POA: Diagnosis present

## 2013-03-28 DIAGNOSIS — E119 Type 2 diabetes mellitus without complications: Secondary | ICD-10-CM | POA: Diagnosis present

## 2013-03-28 DIAGNOSIS — I6529 Occlusion and stenosis of unspecified carotid artery: Secondary | ICD-10-CM | POA: Diagnosis present

## 2013-03-28 DIAGNOSIS — F325 Major depressive disorder, single episode, in full remission: Secondary | ICD-10-CM | POA: Diagnosis present

## 2013-03-28 DIAGNOSIS — E1169 Type 2 diabetes mellitus with other specified complication: Secondary | ICD-10-CM | POA: Diagnosis present

## 2013-03-28 DIAGNOSIS — J9600 Acute respiratory failure, unspecified whether with hypoxia or hypercapnia: Secondary | ICD-10-CM | POA: Diagnosis present

## 2013-03-28 DIAGNOSIS — I739 Peripheral vascular disease, unspecified: Secondary | ICD-10-CM | POA: Diagnosis present

## 2013-03-28 DIAGNOSIS — J189 Pneumonia, unspecified organism: Principal | ICD-10-CM | POA: Diagnosis present

## 2013-03-28 LAB — BLOOD GAS, ARTERIAL
Acid-Base Excess: 2 mmol/L (ref 0.0–2.0)
Bicarbonate: 25.6 mEq/L — ABNORMAL HIGH (ref 20.0–24.0)
Drawn by: 257701
O2 Content: 3 L/min
pCO2 arterial: 37.9 mmHg (ref 35.0–45.0)
pO2, Arterial: 75.9 mmHg — ABNORMAL LOW (ref 80.0–100.0)

## 2013-03-28 LAB — BASIC METABOLIC PANEL
CO2: 27 mEq/L (ref 19–32)
Chloride: 100 mEq/L (ref 96–112)
Creatinine, Ser: 1.33 mg/dL (ref 0.50–1.35)
GFR calc Af Amer: 56 mL/min — ABNORMAL LOW (ref >90)
Potassium: 3.6 mEq/L (ref 3.5–5.1)

## 2013-03-28 LAB — CBC WITH DIFFERENTIAL/PLATELET
Basophils Absolute: 0 10*3/uL (ref 0.0–0.1)
Basophils Relative: 0 % (ref 0–1)
Lymphocytes Relative: 7 % — ABNORMAL LOW (ref 12–46)
MCH: 32 pg (ref 26.0–34.0)
MCV: 95.1 fL (ref 78.0–100.0)
Monocytes Absolute: 0.5 10*3/uL (ref 0.1–1.0)
Neutro Abs: 6.2 10*3/uL (ref 1.7–7.7)
Platelets: 138 10*3/uL — ABNORMAL LOW (ref 150–400)
RBC: 4.06 MIL/uL — ABNORMAL LOW (ref 4.22–5.81)

## 2013-03-28 LAB — TSH: TSH: 1.416 u[IU]/mL (ref 0.350–4.500)

## 2013-03-28 LAB — URINALYSIS, ROUTINE W REFLEX MICROSCOPIC
Glucose, UA: NEGATIVE mg/dL
Ketones, ur: NEGATIVE mg/dL
Leukocytes, UA: NEGATIVE
Nitrite: NEGATIVE
Specific Gravity, Urine: 1.018 (ref 1.005–1.030)
pH: 6 (ref 5.0–8.0)

## 2013-03-28 LAB — PRO B NATRIURETIC PEPTIDE: Pro B Natriuretic peptide (BNP): 484.7 pg/mL — ABNORMAL HIGH (ref 0–450)

## 2013-03-28 LAB — URINE MICROSCOPIC-ADD ON

## 2013-03-28 LAB — MRSA PCR SCREENING: MRSA by PCR: NEGATIVE

## 2013-03-28 LAB — APTT: aPTT: 34 seconds (ref 24–37)

## 2013-03-28 LAB — INFLUENZA PANEL BY PCR (TYPE A & B)
Influenza A By PCR: NEGATIVE
Influenza B By PCR: NEGATIVE

## 2013-03-28 LAB — PHOSPHORUS: Phosphorus: 2.8 mg/dL (ref 2.3–4.6)

## 2013-03-28 LAB — GLUCOSE, CAPILLARY: Glucose-Capillary: 144 mg/dL — ABNORMAL HIGH (ref 70–99)

## 2013-03-28 MED ORDER — CELECOXIB 200 MG PO CAPS
200.0000 mg | ORAL_CAPSULE | Freq: Two times a day (BID) | ORAL | Status: DC
  Administered 2013-03-28 – 2013-03-30 (×4): 200 mg via ORAL
  Filled 2013-03-28 (×5): qty 1

## 2013-03-28 MED ORDER — SODIUM CHLORIDE 0.9 % IV SOLN: INTRAVENOUS | Status: DC

## 2013-03-28 MED ORDER — ASPIRIN 325 MG PO TABS
325.0000 mg | ORAL_TABLET | Freq: Every day | ORAL | Status: DC
  Administered 2013-03-29 – 2013-03-30 (×2): 325 mg via ORAL
  Filled 2013-03-28 (×3): qty 1

## 2013-03-28 MED ORDER — ALBUTEROL SULFATE (5 MG/ML) 0.5% IN NEBU: 2.5000 mg | INHALATION_SOLUTION | RESPIRATORY_TRACT | Status: AC | PRN

## 2013-03-28 MED ORDER — OMEGA-3-ACID ETHYL ESTERS 1 G PO CAPS
2.0000 g | ORAL_CAPSULE | Freq: Every day | ORAL | Status: DC
  Administered 2013-03-28 – 2013-03-30 (×3): 2 g via ORAL
  Filled 2013-03-28 (×3): qty 2

## 2013-03-28 MED ORDER — FENOFIBRATE 160 MG PO TABS
160.0000 mg | ORAL_TABLET | Freq: Every day | ORAL | Status: DC
  Administered 2013-03-28 – 2013-03-30 (×3): 160 mg via ORAL
  Filled 2013-03-28 (×3): qty 1

## 2013-03-28 MED ORDER — GLUCOSAMINE-CHONDROITIN 500-400 MG PO TABS: 1.0000 | ORAL_TABLET | Freq: Two times a day (BID) | ORAL | Status: DC

## 2013-03-28 MED ORDER — FLUOXETINE HCL 10 MG PO CAPS
10.0000 mg | ORAL_CAPSULE | Freq: Every day | ORAL | Status: DC
  Administered 2013-03-28 – 2013-03-30 (×3): 10 mg via ORAL
  Filled 2013-03-28 (×3): qty 1

## 2013-03-28 MED ORDER — ALBUTEROL SULFATE (5 MG/ML) 0.5% IN NEBU
2.5000 mg | INHALATION_SOLUTION | Freq: Four times a day (QID) | RESPIRATORY_TRACT | Status: DC
  Administered 2013-03-28: 2.5 mg via RESPIRATORY_TRACT
  Filled 2013-03-28: qty 0.5

## 2013-03-28 MED ORDER — IPRATROPIUM BROMIDE 0.02 % IN SOLN: 0.5000 mg | RESPIRATORY_TRACT | Status: DC | PRN

## 2013-03-28 MED ORDER — CEFTRIAXONE SODIUM 1 G IJ SOLR
1.0000 g | INTRAMUSCULAR | Status: DC
  Administered 2013-03-28 – 2013-03-30 (×3): 1 g via INTRAVENOUS
  Filled 2013-03-28 (×3): qty 10

## 2013-03-28 MED ORDER — ALBUTEROL SULFATE (5 MG/ML) 0.5% IN NEBU
5.0000 mg | INHALATION_SOLUTION | Freq: Once | RESPIRATORY_TRACT | Status: AC
  Administered 2013-03-28: 5 mg via RESPIRATORY_TRACT
  Filled 2013-03-28: qty 1

## 2013-03-28 MED ORDER — TAMSULOSIN HCL 0.4 MG PO CAPS
0.4000 mg | ORAL_CAPSULE | Freq: Every day | ORAL | Status: DC
  Administered 2013-03-28 – 2013-03-29 (×2): 0.4 mg via ORAL
  Filled 2013-03-28 (×4): qty 1

## 2013-03-28 MED ORDER — DEXTROSE 5 % IV SOLN: 500.0000 mg | INTRAVENOUS | Status: DC

## 2013-03-28 MED ORDER — SODIUM CHLORIDE 0.9 % IJ SOLN
3.0000 mL | Freq: Two times a day (BID) | INTRAMUSCULAR | Status: DC
  Administered 2013-03-28 – 2013-03-29 (×2): 3 mL via INTRAVENOUS

## 2013-03-28 MED ORDER — ACETAMINOPHEN 650 MG RE SUPP: 650.0000 mg | Freq: Four times a day (QID) | RECTAL | Status: DC | PRN

## 2013-03-28 MED ORDER — FLUOXETINE HCL 10 MG PO CAPS
10.0000 mg | ORAL_CAPSULE | Freq: Every day | ORAL | Status: DC
  Filled 2013-03-28: qty 1

## 2013-03-28 MED ORDER — ACETAMINOPHEN 325 MG PO TABS
650.0000 mg | ORAL_TABLET | Freq: Four times a day (QID) | ORAL | Status: DC | PRN
  Administered 2013-03-28: 650 mg via ORAL
  Filled 2013-03-28: qty 2

## 2013-03-28 MED ORDER — TEMAZEPAM 15 MG PO CAPS: 30.0000 mg | ORAL_CAPSULE | Freq: Every evening | ORAL | Status: DC | PRN

## 2013-03-28 MED ORDER — ATORVASTATIN CALCIUM 80 MG PO TABS
80.0000 mg | ORAL_TABLET | Freq: Every day | ORAL | Status: DC
  Administered 2013-03-28 – 2013-03-30 (×3): 80 mg via ORAL
  Filled 2013-03-28 (×3): qty 1

## 2013-03-28 MED ORDER — AZITHROMYCIN 500 MG PO TABS
500.0000 mg | ORAL_TABLET | Freq: Every day | ORAL | Status: DC
  Administered 2013-03-28: 500 mg via ORAL
  Filled 2013-03-28 (×2): qty 1

## 2013-03-28 MED ORDER — CLOPIDOGREL BISULFATE 75 MG PO TABS
75.0000 mg | ORAL_TABLET | Freq: Every day | ORAL | Status: DC
  Administered 2013-03-28 – 2013-03-30 (×3): 75 mg via ORAL
  Filled 2013-03-28 (×3): qty 1

## 2013-03-28 MED ORDER — CILOSTAZOL 100 MG PO TABS
100.0000 mg | ORAL_TABLET | Freq: Two times a day (BID) | ORAL | Status: DC
  Administered 2013-03-28 – 2013-03-30 (×4): 100 mg via ORAL
  Filled 2013-03-28 (×5): qty 1

## 2013-03-28 MED ORDER — NIFEDIPINE ER 30 MG PO TB24
30.0000 mg | ORAL_TABLET | Freq: Every day | ORAL | Status: DC
  Administered 2013-03-28 – 2013-03-30 (×3): 30 mg via ORAL
  Filled 2013-03-28 (×3): qty 1

## 2013-03-28 MED ORDER — ENOXAPARIN SODIUM 30 MG/0.3ML ~~LOC~~ SOLN
30.0000 mg | SUBCUTANEOUS | Status: DC
  Filled 2013-03-28: qty 0.3

## 2013-03-28 MED ORDER — FUROSEMIDE 40 MG PO TABS
40.0000 mg | ORAL_TABLET | Freq: Every day | ORAL | Status: DC
  Filled 2013-03-28: qty 1

## 2013-03-28 MED ORDER — IPRATROPIUM BROMIDE 0.02 % IN SOLN
0.5000 mg | Freq: Once | RESPIRATORY_TRACT | Status: AC
  Administered 2013-03-28: 0.5 mg via RESPIRATORY_TRACT
  Filled 2013-03-28: qty 2.5

## 2013-03-28 MED ORDER — OMEGA-3 FATTY ACIDS 1000 MG PO CAPS: 2.0000 g | ORAL_CAPSULE | Freq: Every day | ORAL | Status: DC

## 2013-03-28 MED ORDER — ACETAMINOPHEN 325 MG PO TABS
650.0000 mg | ORAL_TABLET | Freq: Once | ORAL | Status: AC
  Administered 2013-03-28: 650 mg via ORAL
  Filled 2013-03-28: qty 2

## 2013-03-28 MED ORDER — IRBESARTAN 75 MG PO TABS
75.0000 mg | ORAL_TABLET | Freq: Every day | ORAL | Status: DC
  Administered 2013-03-28 – 2013-03-30 (×3): 75 mg via ORAL
  Filled 2013-03-28 (×3): qty 1

## 2013-03-28 MED ORDER — ONDANSETRON HCL 4 MG PO TABS: 4.0000 mg | ORAL_TABLET | Freq: Four times a day (QID) | ORAL | Status: DC | PRN

## 2013-03-28 MED ORDER — IRBESARTAN 75 MG PO TABS
75.0000 mg | ORAL_TABLET | Freq: Every day | ORAL | Status: DC
  Filled 2013-03-28: qty 1

## 2013-03-28 MED ORDER — IOHEXOL 350 MG/ML SOLN
100.0000 mL | Freq: Once | INTRAVENOUS | Status: AC | PRN
  Administered 2013-03-28: 100 mL via INTRAVENOUS

## 2013-03-28 MED ORDER — ALPRAZOLAM 0.5 MG PO TABS: 0.5000 mg | ORAL_TABLET | Freq: Three times a day (TID) | ORAL | Status: DC | PRN

## 2013-03-28 MED ORDER — ONDANSETRON HCL 4 MG/2ML IJ SOLN: 4.0000 mg | Freq: Four times a day (QID) | INTRAMUSCULAR | Status: DC | PRN

## 2013-03-28 MED ORDER — EZETIMIBE 10 MG PO TABS
10.0000 mg | ORAL_TABLET | Freq: Every day | ORAL | Status: DC
  Administered 2013-03-28 – 2013-03-30 (×3): 10 mg via ORAL
  Filled 2013-03-28 (×3): qty 1

## 2013-03-28 MED ORDER — HYDROCODONE-ACETAMINOPHEN 5-325 MG PO TABS: 1.0000 | ORAL_TABLET | ORAL | Status: DC | PRN

## 2013-03-28 MED ORDER — SODIUM CHLORIDE 0.9 % IV SOLN
INTRAVENOUS | Status: AC
  Administered 2013-03-28: 10:00:00 via INTRAVENOUS

## 2013-03-28 MED ORDER — SODIUM CHLORIDE 0.9 % IV BOLUS (SEPSIS)
500.0000 mL | Freq: Once | INTRAVENOUS | Status: AC
  Administered 2013-03-28: 500 mL via INTRAVENOUS

## 2013-03-28 MED ORDER — NITROGLYCERIN 0.4 MG SL SUBL: 0.4000 mg | SUBLINGUAL_TABLET | SUBLINGUAL | Status: DC | PRN

## 2013-03-28 NOTE — ED Notes (Signed)
Pulse Ox while ambulating was 89%. HR 125. Domenic Moras PA, notified.

## 2013-03-28 NOTE — Telephone Encounter (Signed)
They have different mechanisms of action

## 2013-03-28 NOTE — Progress Notes (Signed)
PHARMACIST - PHYSICIAN ORDER COMMUNICATION  CONCERNING: P&T Medication Policy on Herbal Medications  DESCRIPTION:  This patient's order for:  Glucosamine-Chondroitin  has been noted.  This product(s) is classified as an "herbal" or natural product. Due to a lack of definitive safety studies or FDA approval, nonstandard manufacturing practices, plus the potential risk of unknown drug-drug interactions while on inpatient medications, the Pharmacy and Therapeutics Committee does not permit the use of "herbal" or natural products of this type within [place].   ACTION TAKEN: The pharmacy department is unable to verify this order at this time and your patient has been informed of this safety policy. Please reevaluate patient's clinical condition at discharge and address if the herbal or natural product(s) should be resumed at that time.   

## 2013-03-28 NOTE — ED Notes (Signed)
Rona Ravens PA at bedside.

## 2013-03-28 NOTE — H&P (Addendum)
[facility] History and Physical  Andrew [name] [id number] DOB: 12-Feb-1928 DOA: 03/28/2013  Referring physician: ED physician PCP: [name], MD   Chief Complaint: shortness of breath  HPI:  79 year old male with past medical history including but not limited to hypertension, diabetes, CAD, hypothyroidism who presented to [facility] ED 03/28/2013 with progressively worsening shortness of breath over past few days prior to this admission associated with non productive cough and fevers. Patient reported no associated chest pain, no palpitations. On EMS arrival patient was noted to have O2 saturation of 87% while breathing ambient air. No reportes of abdominal pain, no nausea or vomiting. No lightheadedness or dizziness or loss of consciousness. In ED, patient was found to have O2 saturation of 91% on 2 L nasal canula. His BP was 158/81 and HR of 127. His CBC except for mild thrombocytopenia of 138 was essentially unremarkable.CXR showed no acute cardiopulmonary process. CT angio chest ruled out pulmonary embolism but it did reveal right lower lobe pneumonia and additional lymph nodes in subcarinal, hilar and mediastinal area thought to be of reactive etiology.  Assessment and Plan:  Principal Problem:   *Acute respiratory failure with hypoxia - likely due to right lower lobe pneumonia as evidenced on CT angio chest. No evidence of pulmonary embolism - pneumonia order set in place; follow up blood culture results, legionella, H.flu and strep pneumoniae - started azithromycin and ceftriaxone for community acquire pneumonia - albuterol and atrovent nebulizers as needed every 4 hours - Oxygen support via nasal canula to keep O2 saturation above 90%  Active Problems:   Thrombocytopenia - mild, platelet count 138 on admission - use SCD's for DVT prophylaxis   Peripheral vascular disease - continue aspirin and pletal   Other and unspecified hyperlipidemia   ANXIETY AND DEPRESSION - continue  Prozac 10 mg daily   HYPERTENSION - continue nifedipine 30 mg daily, Avapro 75 mg daily   CORONARY ATHEROSCLEROSIS NATIVE CORONARY ARTERY - continue aspirin and Plavix - continue atorvastatin 80 mg daily   CAROTID ARTERY STENOSIS, RIGHT - on aspirin and plavix   PERIPHERAL EDEMA - hold Lasix 40 mg daily   DYSLIPIDEMIA  - continue atorvastatin 80 mg daily, Ezetimibe 10 mg daily and fenofibrate 160 mg daily - may continue lovaza 2 gm daily   DVT prophylaxis - use SCD's bilaterally  Code Status: Full Family Communication: Pt at bedside Disposition Plan: PT evaluation - follow up on recomendations  Antibiotics:  Azithromycin 03/28/2013 -->  Ceftriaxone 03/28/2013 -->  [facility] [id number]  Review of Systems:  Constitutional: Positive for fever, chills and malaise/fatigue. Negative for diaphoresis.  HENT: Negative for hearing loss, ear pain, nosebleeds, congestion, sore throat, neck pain, tinnitus and ear discharge.   Eyes: Negative for blurred vision, double vision, photophobia, pain, discharge and redness.  Respiratory: per HPI.   Cardiovascular: Negative for chest pain, palpitations, orthopnea, claudication and leg swelling.  Gastrointestinal: Negative for nausea, vomiting and abdominal pain. Negative for heartburn, constipation, blood in stool and melena.  Genitourinary: Negative for dysuria, urgency, frequency, hematuria and flank pain.  Musculoskeletal: Negative for myalgias, back pain, joint pain and falls.  Skin: Negative for itching and rash.  Neurological: Negative for dizziness and weakness. Negative for tingling, tremors, sensory change, speech change, focal weakness, loss of consciousness and headaches.  Endo/Heme/Allergies: Negative for environmental allergies and polydipsia. Does not bruise/bleed easily.  Psychiatric/Behavioral: Negative for suicidal ideas. The patient is not nervous/anxious.      Past Medical History  Diagnosis Date  . CAD (coronary artery  disease)   . LVF (left ventricular failure)   . Carotid artery stenosis   . Hyperlipidemia   . Hypertension   . GERD (gastroesophageal reflux disease)   . IBS (irritable bowel syndrome)   . Arthritis   . Diabetes mellitus   . Thyroid disease   . Anemia   . Vitamin D deficiency   . Depression   . Fatty liver 2008  . Cancer     Melanoma - Back  . AAA (abdominal aortic aneurysm)     Past Surgical History  Procedure Laterality Date  . Total knee arthroplasty      bilateral  . Cardiac stents  2005  . Coronary artery bypass graft  1992  . Joint replacement      Bilateral knee arthroplasty  . Eye surgery      Catarart  . Abdominal aortic aneurysm repair  2002    Social History:  reports that he has quit smoking. He has never used smokeless tobacco. He reports that he drinks about 1.8 ounces of alcohol per week. He reports that he does not use illicit drugs.  Allergies  Allergen Reactions  . Metoprolol     Heart starts racing. Shallow breathing     Family History  Problem Relation Age of Onset  . Colon cancer Father   . Coronary artery disease Brother   . Diabetes Brother   . Cancer Brother     throat and abdominal  . Heart disease Brother   . Hyperlipidemia Brother   . Peripheral vascular disease Brother     Varicose Veins  . Cancer Mother   . Diabetes Son   . Hyperlipidemia Son     Prior to Admission medications   Medication Sig Start Date End Date Taking? Authorizing Provider  ALPRAZolam Duanne Moron) 0.5 MG tablet Take 1 tablet (0.5 mg total) by mouth 3 (three) times daily as needed for sleep. 02/19/13  Yes Janith Lima, MD  aspirin 325 MG tablet Take 325 mg by mouth daily.     Yes Historical Provider, MD  atorvastatin (LIPITOR) 80 MG tablet Take 1 tablet (80 mg total) by mouth daily. 02/15/13  Yes Janith Lima, MD  celecoxib (CELEBREX) 200 MG capsule Take 1 capsule (200 mg total) by mouth 2 (two) times daily. 03/19/13  Yes Janith Lima, MD  clopidogrel (PLAVIX)  75 MG tablet Take 1 tablet (75 mg total) by mouth daily. 03/20/13 03/20/14 Yes Janith Lima, MD  ezetimibe (ZETIA) 10 MG tablet Take 10 mg by mouth daily.   Yes Historical Provider, MD  fenofibrate (TRICOR) 145 MG tablet Take 1 tablet (145 mg total) by mouth daily. 03/20/13  Yes Janith Lima, MD  fish oil-omega-3 fatty acids 1000 MG capsule Take 2 g by mouth daily.     Yes Historical Provider, MD  FLUoxetine (PROZAC) 10 MG capsule Take 1 capsule (10 mg total) by mouth daily. 03/20/13  Yes Janith Lima, MD  furosemide (LASIX) 40 MG tablet Take 40 mg by mouth daily.   Yes Historical Provider, MD  glucosamine-chondroitin 500-400 MG tablet Take 1 tablet by mouth 2 (two) times daily.     Yes Historical Provider, MD  NIFEdipine (PROCARDIA-XL/ADALAT-CC/NIFEDICAL-XL) 30 MG 24 hr tablet Take 30 mg by mouth daily.   Yes Historical Provider, MD  nitroGLYCERIN (NITROSTAT) 0.4 MG SL tablet Place 0.4 mg under the tongue every 5 (five) minutes as needed for chest pain.  Yes Historical Provider, MD  valsartan (DIOVAN) 80 MG tablet Take 80 mg by mouth daily.   Yes Historical Provider, MD  cilostazol (PLETAL) 100 MG tablet Take 1 tablet (100 mg total) by mouth 2 (two) times daily. 03/06/13   Janith Lima, MD  Misc Natural Products (NF FORMULAS TESTOSTERONE) CAPS Take 2 capsules by mouth daily.    Historical Provider, MD  Tamsulosin HCl (FLOMAX) 0.4 MG CAPS Take 1 capsule (0.4 mg total) by mouth daily. 12/31/12   Janith Lima, MD  temazepam (RESTORIL) 30 MG capsule Take 1 capsule (30 mg total) by mouth at bedtime as needed. 07/06/12   Janith Lima, MD    Physical Exam: Filed Vitals:   03/28/13 [id number] 03/28/13 [id number] 03/28/13 0949 03/28/13 1030  BP:  134/54 136/59 123/48  Pulse:  91 82 80  Temp:  102.7 F (39.3 C) 99.4 F (37.4 C) 99.4 F (37.4 C)  TempSrc:  Oral Oral Oral  Resp:  18  26  Height:    5\' 7"  (1.702 m)  Weight:    87.6 kg (193 lb 2 oz)  SpO2: 95% 95% 94% 96%    Physical Exam   Constitutional: Appears in mild distress.   HENT: Normocephalic. External right and left ear normal. Oropharynx is clear and moist.  Eyes: Conjunctivae and EOM are normal. PERRLA, no scleral icterus.  Neck: Normal ROM. Neck supple. No JVD. No tracheal deviation. No thyromegaly.  CVS: RRR, S1/S2 +, no murmurs, no gallops, no carotid bruit.  Pulmonary: diminished breath sounds, rhonchi in right mid lung lobe Abdominal: Soft. BS +,  no distension, tenderness, rebound or guarding.  Musculoskeletal: Normal range of motion. LE pitting edema but no tenderness.  Lymphadenopathy: No lymphadenopathy noted, cervical, inguinal. Neuro: Alert. Normal reflexes, muscle tone coordination. No cranial nerve deficit. Skin: Skin is warm and dry. No rash noted. Not diaphoretic. No erythema. No pallor.  Psychiatric: Normal mood and affect. Behavior, judgment, thought content normal.   Labs on Admission:  Basic Metabolic Panel:  Recent Labs Lab 03/28/13 0700  NA 138  K 3.6  CL 100  CO2 27  GLUCOSE 135*  BUN 21  CREATININE 1.33  CALCIUM 9.3   CBC:  Recent Labs Lab 03/28/13 0700  WBC 7.4  NEUTROABS 6.2  HGB 13.0  HCT 38.6*  MCV 95.1  PLT 138*   CBG:  Recent Labs Lab 03/28/13 0638  GLUCAP 144*    Radiological Exams on Admission: Dg Chest 2 View 03/28/2013  *IMPRESSION: No acute cardiopulmonary disease.  Cardiomegaly with mild to moderate right hemidiaphragm elevation; secondary right infrahilar volume loss.      Faye Ramsay, MD  [facility] Pager [phone]  If 7PM-7AM, please contact night-coverage [email or web address] Password [facility] 03/28/2013, 12:28 PM

## 2013-03-28 NOTE — ED Notes (Signed)
[id number] Expected date:<BR> Expected time:<BR> Means of arrival:<BR> Comments:<BR> EMS/79 yo male with SOB/chills

## 2013-03-28 NOTE — ED Provider Notes (Signed)
History     CSN: [id number]  Arrival date & time 03/28/13  [id number]   First MD Initiated Contact with Patient 03/28/13 0636      Chief Complaint  Patient presents with  . Shortness of Breath  . Fever    (Consider location/radiation/quality/duration/timing/severity/associated sxs/prior treatment) HPI  79 year old male with history of diabetes, CAD, and hypertension presents with cold symptoms. Patient reports for the past 3-4 days he has gradual onset of cold symptoms including throat irritation, nasal drainage, nonproductive cough. Last night he develops fever and chills with increased shortness of breath. He also endorsed pleuritic chest pain worsened with cough. The chills was getting progressively worse this AM prompting him to call EMS. EMS noticed that initially his oxygen level was 87% on room air, thus giving him O2 supplementation. Patient states he felt better after receiving supplemental oxygenation prior to arrival. Patient reports taking ibuprofen for the past several days with some relief. He denies headache, neck stiffness, dyspnea on exertion, hemoptysis, nausea, vomiting, diarrhea, abdominal pain, dysuria, or rash . Patient reports his wife has similar cold symptoms but has improved. He was a former smoker but has not smoked for more than 30 years. He has a significant history of cardiac disease, however denies any typical chest pain.    Past Medical History  Diagnosis Date  . CAD (coronary artery disease)   . LVF (left ventricular failure)   . Carotid artery stenosis   . Hyperlipidemia   . Hypertension   . GERD (gastroesophageal reflux disease)   . IBS (irritable bowel syndrome)   . Arthritis   . Diabetes mellitus   . Thyroid disease   . Anemia   . Vitamin D deficiency   . Depression   . Fatty liver 2008  . Cancer     Melanoma - Back  . AAA (abdominal aortic aneurysm)     Past Surgical History  Procedure Laterality Date  . Total knee arthroplasty       bilateral  . Cardiac stents  2005  . Coronary artery bypass graft  1992  . Joint replacement      Bilateral knee arthroplasty  . Eye surgery      Catarart  . Abdominal aortic aneurysm repair  2002    Family History  Problem Relation Age of Onset  . Colon cancer Father   . Coronary artery disease Brother   . Diabetes Brother   . Cancer Brother     throat and abdominal  . Heart disease Brother   . Hyperlipidemia Brother   . Peripheral vascular disease Brother     Varicose Veins  . Cancer Mother   . Diabetes Son   . Hyperlipidemia Son     History  Substance Use Topics  . 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 Smokeless tobacco: Never Used     Comment: quit atleast 25 yrs ago, per pt  . Alcohol Use: 1.8 oz/week    3 Shots of liquor per week     Comment: socially      Review of Systems  Constitutional:       A complete 10 system review of systems was obtained and all systems are negative except as noted in the HPI and PMH.    Allergies  Metoprolol  Home Medications   Current Outpatient Rx  Name  Route  Sig  Dispense  Refill  . ACCU-CHEK AVIVA PLUS test strip      CHECK BLOOD SUGAR 4 TIMES A DAY.  100 each   PRN   . ALPRAZolam (XANAX) 0.5 MG tablet   Oral   Take 1 tablet (0.5 mg total) by mouth 3 (three) times daily as needed for sleep.   90 tablet   5   . aspirin 325 MG tablet   Oral   Take 325 mg by mouth daily.           Marland Kitchen atorvastatin (LIPITOR) 80 MG tablet   Oral   Take 1 tablet (80 mg total) by mouth daily.   90 tablet   1   . celecoxib (CELEBREX) 200 MG capsule   Oral   Take 1 capsule (200 mg total) by mouth 2 (two) times daily.   180 capsule   3   . cilostazol (PLETAL) 100 MG tablet   Oral   Take 1 tablet (100 mg total) by mouth 2 (two) times daily.   180 tablet   3   . clopidogrel (PLAVIX) 75 MG tablet   Oral   Take 1 tablet (75 mg total) by mouth daily.   90 tablet   3   . DIOVAN 80 MG tablet      TAKE 1 TABLET DAILY   90  tablet   1   . fenofibrate (TRICOR) 145 MG tablet   Oral   Take 1 tablet (145 mg total) by mouth daily.   90 tablet   3   . fish oil-omega-3 fatty acids 1000 MG capsule   Oral   Take 2 g by mouth daily.           Marland Kitchen FLUoxetine (PROZAC) 10 MG capsule   Oral   Take 1 capsule (10 mg total) by mouth daily.   90 capsule   3   . furosemide (LASIX) 40 MG tablet      TAKE 1 TABLET DAILY   90 tablet   0   . glucosamine-chondroitin 500-400 MG tablet   Oral   Take 1 tablet by mouth 2 (two) times daily.           . Misc Natural Products (NF FORMULAS TESTOSTERONE) CAPS   Oral   Take 2 capsules by mouth daily.         Marland Kitchen NIFEdipine (PROCARDIA-XL/ADALAT-CC/NIFEDICAL-XL) 30 MG 24 hr tablet      TAKE 1 TABLET DAILY   90 tablet   1   . nitroGLYCERIN (NITROSTAT) 0.4 MG SL tablet   Sublingual   Place 0.4 mg under the tongue every 5 (five) minutes as needed.           . Tamsulosin HCl (FLOMAX) 0.4 MG CAPS   Oral   Take 1 capsule (0.4 mg total) by mouth daily.   30 capsule   6   . temazepam (RESTORIL) 30 MG capsule   Oral   Take 1 capsule (30 mg total) by mouth at bedtime as needed.   30 capsule   5   . ZETIA 10 MG tablet      TAKE 1 TABLET DAILY   90 tablet   2     SpO2 94%  Physical Exam  Nursing note and vitals reviewed. Constitutional: He is oriented to person, place, and time. He appears well-developed and well-nourished. No distress (in mild respiratory distress, tachypneic but speaking in full complete sentences.).  Awake, alert, nontoxic appearance  HENT:  Head: Atraumatic.  Right Ear: External ear normal.  Left Ear: External ear normal.  Nose: Nose normal.  Mouth/Throat: Oropharynx is clear and moist.  No oropharyngeal exudate.  Eyes: Conjunctivae are normal. Right eye exhibits no discharge. Left eye exhibits no discharge.  Neck: Normal range of motion. Neck supple. No JVD present.  Cardiovascular: Regular rhythm and intact distal pulses.    Tachycardia without murmurs, rubs, or gallops  Pulmonary/Chest: Effort normal. No respiratory distress. He has no wheezes. He exhibits no tenderness.  Scatter rhonchi is without rales, or wheezes heard  Abdominal: Soft. There is no tenderness. There is no rebound.  Musculoskeletal: He exhibits no edema and no tenderness.  ROM appears intact, no obvious focal weakness  Lymphadenopathy:    He has no cervical adenopathy.  Neurological: He is alert and oriented to person, place, and time.  Skin: Skin is warm and dry. No rash noted.  Psychiatric: He has a normal mood and affect.    ED Course  Procedures (including critical care time)   Date: 03/28/2013  Rate: 127  Rhythm: atrial flutter  QRS Axis: left  Intervals: normal  ST/T Wave abnormalities: nonspecific ST/T changes  Conduction Disutrbances:left anterior fascicular block  Narrative Interpretation:   Old EKG Reviewed: unchanged    6:57 AM Patient presents with cold symptoms, fever and shortness of breath.  Presents with rigors. He however appears to be much better after receiving O2 supplementation. Has a temperature of 100.5. Tylenol given. Albuterol and Atrovent retreatment given.work up initiated.   7:41 AM Chest x-ray shows no evidence of pneumonia. Will workup further with blood cultures, and we'll check a lactic acid. Patient will receive 500 mL of normal saline for tachycardia. We'll continue to monitor patient. If patient is unable to maintain a good oxygenation level while ambulating, we'll consider admission. Care discussed with attending.  8:44 AM Temp 102.  When ambulate, O2 is 89%, HR 125.  Will call for admission for further management.  My attending has seen pt and agrees with plan.  Pt meets SIRs criteria.  Pt also score moderate on Well's criteria with tachycardia and malignancy.  Will obtain CT chest angio to r/o PE.     Labs Reviewed  CBC WITH DIFFERENTIAL - Abnormal; Notable for the following:    RBC 4.06  (*)    HCT 38.6 (*)    Platelets 138 (*)    Neutrophils Relative 84 (*)    Lymphocytes Relative 7 (*)    Lymphs Abs 0.5 (*)    All other components within normal limits  BASIC METABOLIC PANEL - Abnormal; Notable for the following:    Glucose, Bld 135 (*)    GFR calc non Af Amer 48 (*)    GFR calc Af Amer 56 (*)    All other components within normal limits  URINALYSIS, ROUTINE W REFLEX MICROSCOPIC - Abnormal; Notable for the following:    Hgb urine dipstick TRACE (*)    Protein, ur 100 (*)    All other components within normal limits  PRO B NATRIURETIC PEPTIDE - Abnormal; Notable for the following:    Pro B Natriuretic peptide (BNP) 484.7 (*)    All other components within normal limits  URINE MICROSCOPIC-ADD ON - Abnormal; Notable for the following:    Casts GRANULAR CAST (*)    All other components within normal limits  CULTURE, BLOOD (ROUTINE X 2)  CULTURE, BLOOD (ROUTINE X 2)  BLOOD GAS, ARTERIAL  CG4 I-STAT (LACTIC ACID)   Dg Chest 2 View  03/28/2013  *RADIOLOGY REPORT*  Clinical Data: Cough.  Congestion.  Shortness of breath and fever for 2 days.  Coronary artery  disease.  Diabetes.  CHEST - 2 VIEW  Comparison: 01/24/2003  Findings: Lateral view degraded by patient arm position.  Prior median sternotomy. Midline trachea.  Moderate cardiomegaly. Mild to moderate right hemidiaphragm elevation. No pleural effusion or pneumothorax.  Mild right infrahilar volume loss, slightly increased. No lobar consolidation.  Mild basilar interstitial thickening which is nonspecific.  IMPRESSION: No acute cardiopulmonary disease.  Cardiomegaly with mild to moderate right hemidiaphragm elevation; secondary right infrahilar volume loss.   Original Report Authenticated By: Abigail Miyamoto, M.D.      1. SIRS (systemic inflammatory response syndrome)   2. Hypoxia       MDM  BP 134/62  Pulse 80  Temp(Src) 97.7 F (36.5 C) (Oral)  Resp 21  Ht 5\' 7"  (1.702 m)  Wt 194 lb 10.7 oz (88.3 kg)  BMI  30.48 kg/m2  SpO2 92%  I have reviewed nursing notes and vital signs. I personally reviewed the imaging tests through PACS system  I reviewed available ER/hospitalization records thought the EMR         Domenic Moras, PA-C 03/29/13 0600

## 2013-03-28 NOTE — ED Notes (Signed)
Report received. Airway intact. No S&S of distress noted.

## 2013-03-28 NOTE — ED Notes (Signed)
Per EMS pt c/o "cold" symptoms 2 days ago this AM woke up with fever chills and SOB. Fire Department report SPO2 of 87% on RA. Pt on NRB on EMS arrival SPO2 94%. Pt denies CP.

## 2013-03-28 NOTE — ED Notes (Signed)
NPO until CTA completed.

## 2013-03-28 NOTE — ED Notes (Signed)
RT notified of treatments.

## 2013-03-28 NOTE — Telephone Encounter (Signed)
Prior authorization needed per pharmacy in order for both meds to be covered. PA form received and pending completion.

## 2013-03-29 DIAGNOSIS — J189 Pneumonia, unspecified organism: Principal | ICD-10-CM

## 2013-03-29 LAB — LEGIONELLA ANTIGEN, URINE: Legionella Antigen, Urine: NEGATIVE

## 2013-03-29 LAB — GLUCOSE, CAPILLARY: Glucose-Capillary: 108 mg/dL — ABNORMAL HIGH (ref 70–99)

## 2013-03-29 LAB — COMPREHENSIVE METABOLIC PANEL
CO2: 29 mEq/L (ref 19–32)
Calcium: 8.9 mg/dL (ref 8.4–10.5)
Creatinine, Ser: 1.67 mg/dL — ABNORMAL HIGH (ref 0.50–1.35)
GFR calc Af Amer: 42 mL/min — ABNORMAL LOW (ref >90)
GFR calc non Af Amer: 37 mL/min — ABNORMAL LOW (ref >90)
Glucose, Bld: 104 mg/dL — ABNORMAL HIGH (ref 70–99)

## 2013-03-29 LAB — CBC
Hemoglobin: 11 g/dL — ABNORMAL LOW (ref 13.0–17.0)
RBC: 3.4 MIL/uL — ABNORMAL LOW (ref 4.22–5.81)
WBC: 9.2 10*3/uL (ref 4.0–10.5)

## 2013-03-29 MED ORDER — AZITHROMYCIN 500 MG PO TABS
500.0000 mg | ORAL_TABLET | Freq: Every day | ORAL | Status: DC
  Administered 2013-03-30: 500 mg via ORAL
  Filled 2013-03-29: qty 1

## 2013-03-29 MED ORDER — DEXTROSE 5 % IV SOLN
500.0000 mg | Freq: Once | INTRAVENOUS | Status: AC
  Administered 2013-03-29: 500 mg via INTRAVENOUS
  Filled 2013-03-29: qty 500

## 2013-03-29 MED ORDER — FUROSEMIDE 40 MG PO TABS
40.0000 mg | ORAL_TABLET | Freq: Every day | ORAL | Status: DC
  Administered 2013-03-30: 40 mg via ORAL
  Filled 2013-03-29: qty 1

## 2013-03-29 MED ORDER — FUROSEMIDE 40 MG PO TABS
40.0000 mg | ORAL_TABLET | Freq: Every day | ORAL | Status: DC
  Filled 2013-03-29: qty 1

## 2013-03-29 NOTE — Evaluation (Addendum)
Physical Therapy Evaluation Patient Details Name: Andrew [name] MRN: [id number] DOB: 1928/11/02 Today's Date: 03/29/2013 Time: [id number] PT Time Calculation (min): 22 min  PT Assessment / Plan / Recommendation Clinical Impression  Pt.  is 79 yo male admitted early 03/28/13 with chills, SOB. Pt. found to have pneumonia. Pt. was independent PTA. Pt. ambulated x 125 ft on RA with sats drop to 83% briefly. Quick return to 94% on 4 l after back to room. Pt. will benefit from pt WHILE IN ACUTE CARE TO RETURN TO INDEPENDENT LEVEL.May benefit from [facility] at DC.    PT Assessment  Patient needs continued PT services    Follow Up Recommendations  Home health PT    Does the patient have the potential to tolerate intense rehabilitation      Barriers to Discharge        Equipment Recommendations  None recommended by PT    Recommendations for Other Services OT consult   Frequency Min 3X/week    Precautions / Restrictions Precautions Precaution Comments: monitor sats.   Pertinent Vitals/Pain On 4 l 100%, 83% while walking on RA, replaced 4 l and back to 94% quickly.      Mobility  Bed Mobility Bed Mobility: Supine to Sit Supine to Sit: 5: Supervision;HOB elevated Transfers Transfers: Sit to Stand;Stand to Sit;Stand Pivot Transfers Sit to Stand: 4: Min assist from bed Stand to Sit: 5: Supervision to bed and recliner with armrests Stand Pivot Transfers: 4: Min assist with HH from bed to recliner. Details for Transfer Assistance: pt. unsteady upon standing up and required staedy assist. Ambulation/Gait Ambulation/Gait Assistance: 4: Min assist Ambulation Distance (Feet): 125 Feet Assistive device: Rolling walker Ambulation/Gait Assistance Details: pt. ambulates fast, better balance with RW this session. encouraged pursed lip breaths. Gait Pattern: Step-through pattern    Exercises     PT Diagnosis: Difficulty walking;Generalized weakness  PT Problem List: Decreased activity  tolerance;Decreased balance;Decreased mobility;Cardiopulmonary status limiting activity;Decreased knowledge of precautions;Decreased safety awareness;Decreased knowledge of use of DME PT Treatment Interventions: DME instruction;Gait training;Stair training;Functional mobility training;Therapeutic activities;Therapeutic exercise;Balance training;Patient/family education   PT Goals Acute Rehab PT Goals PT Goal Formulation: With patient Time For Goal Achievement: 04/12/13 Potential to Achieve Goals: Good Pt will go Supine/Side to Sit: Independently PT Goal: Supine/Side to Sit - Progress: Goal set today Pt will go Sit to Supine/Side: Independently PT Goal: Sit to Supine/Side - Progress: Goal set today Pt will go Sit to Stand: Independently PT Goal: Sit to Stand - Progress: Goal set today Pt will go Stand to Sit: Independently PT Goal: Stand to Sit - Progress: Goal set today Pt will Ambulate: >150 feet;with modified independence;with least restrictive assistive device PT Goal: Ambulate - Progress: Goal set today Pt will Go Up / Down Stairs: 3-5 stairs;with supervision;with least restrictive assistive device PT Goal: Up/Down Stairs - Progress: Goal set today Pt will Perform Home Exercise Program: Independently PT Goal: Perform Home Exercise Program - Progress: Goal set today  Visit Information  Last PT Received On: 03/29/13 Assistance Needed: +1    Subjective Data  Subjective: I am getting better. I feel  it in my bones. Patient Stated Goal: to walk   Prior Functioning  Home Living Lives With: Spouse Available Help at Discharge: Family;Available 24 hours/day Type of Home: House Home Access: Stairs to enter [employer] of Steps: 3 Entrance Stairs-Rails: Right;Left Home Layout: Two level;Able to live on main level with bedroom/bathroom Bathroom Toilet: Standard Home Adaptive Equipment: None Prior Function Level of Independence:  Independent Able to Take Stairs?:  Yes Driving: Yes Vocation: Part time employment Communication Communication: HOH    Cognition  Cognition Overall Cognitive Status: Appears within functional limits for tasks assessed/performed Arousal/Alertness: Awake/alert Orientation Level: Appears intact for tasks assessed Behavior During Session: [facility] for tasks performed    Extremity/Trunk Assessment Right Upper Extremity Assessment RUE ROM/Strength/Tone: Within functional levels Left Upper Extremity Assessment LUE ROM/Strength/Tone: Within functional levels Right Lower Extremity Assessment RLE ROM/Strength/Tone: Within functional levels Left Lower Extremity Assessment LLE ROM/Strength/Tone: Within functional levels Trunk Assessment Trunk Assessment: Normal   Balance Balance Balance Assessed: Yes Static Sitting Balance Static Sitting - Balance Support: No upper extremity supported Static Sitting - Level of Assistance: 4: Min assist Static Sitting - Comment/# of Minutes: supervision with RW.  End of Session PT - End of Session Activity Tolerance: Patient tolerated treatment well Patient left: in chair;with call bell/phone within reach Nurse Communication: Mobility status (sats dropped to 83% RA)  GP     Claretha Cooper 03/29/2013, 9:42 AM  Tresa Endo PT [phone]

## 2013-03-29 NOTE — Progress Notes (Signed)
Patient transferring to room 1345.  Report called to [place], Therapist, sports.  Patient to travel by wheelchair.  Will continue to monitor.

## 2013-03-29 NOTE — Progress Notes (Signed)
CARE MANAGEMENT NOTE 03/29/2013  Patient:  Andrew [name], Andrew [name]   Account Number:  [id number]  Date Initiated:  03/29/2013  Documentation initiated by:  DAVIS,RHONDA  Subjective/Objective Assessment:   pt with confirmed pna, o2 sats down to 89%     Action/Plan:   from home, lives with spouse is normal ind. in adls   Anticipated DC Date:  04/01/2013   Anticipated DC Plan:  HOME/SELF CARE  In-house referral  NA      DC Planning Services  NA      [facility] Choice  NA   Choice offered to / List presented to:  NA   DME arranged  NA      DME agency  NA     [place] arranged  NA      [place] agency  NA   Status of service:  In process, will continue to follow Medicare Important Message given?  NA - LOS <3 / Initial given by admissions (If response is "NO", the following Medicare IM given date fields will be blank) Date Medicare IM given:   Date Additional Medicare IM given:    Discharge Disposition:    Per UR Regulation:  Reviewed for med. necessity/level of care/duration of stay  If discussed at [place] of Stay Meetings, dates discussed:    Comments:  [id number] Rosana Hoes, RN, BSN, CCM:  CHART REVIEWED AND UPDATED.  Next chart review due on [id number]. NO DISCHARGE NEEDS PRESENT AT THIS TIME. CASE MANAGEMENT [phone]

## 2013-03-29 NOTE — Progress Notes (Signed)
Patient ID: Andrew [name], male   DOB: 09/18/1928, 79 y.o.   MRN: [id number]  [facility] PROGRESS NOTE  Andrew [name] [id number] DOB: 1928-05-06 DOA: 03/28/2013 PCP: [name], MD  Brief narrative: 79 year old male with past medical history including but not limited to hypertension, diabetes, CAD, hypothyroidism who presented to [facility] ED 03/28/2013 with progressively worsening shortness of breath over past few days prior to this admission associated with non productive cough and fevers. Patient reported no associated chest pain, no palpitations. On EMS arrival patient was noted to have O2 saturation of 87% while breathing ambient air. No reportes of abdominal pain, no nausea or vomiting. No lightheadedness or dizziness or loss of consciousness.   In ED, patient was found to have O2 saturation of 91% on 2 L nasal canula. His BP was 158/81 and HR of 127. His CBC except for mild thrombocytopenia of 138 was essentially unremarkable.CXR showed no acute cardiopulmonary process. CT angio chest ruled out pulmonary embolism but it did reveal right lower lobe pneumonia and additional lymph nodes in subcarinal, hilar and mediastinal area thought to be of reactive etiology.   Assessment and Plan:  Principal Problem:  *Acute respiratory failure with hypoxia  - likely due to right lower lobe pneumonia as evidenced on CT angio chest. No evidence of pulmonary embolism  - pneumonia order set in place; follow up blood culture results, legionella, H.flu and strep pneumoniae  - started azithromycin and ceftriaxone for community acquire pneumonia  - albuterol and atrovent nebulizers as needed every 4 hours  - Oxygen support via nasal canula to keep O2 saturation above 90%  Active Problems:  Thrombocytopenia  - mild, platelet count 138 on admission  - use SCD's for DVT prophylaxis   Peripheral vascular disease  - continue aspirin and pletal   ANXIETY AND DEPRESSION  - continue Prozac 10 mg daily   HYPERTENSION  - continue nifedipine 30 mg daily, Avapro 75 mg daily  - continue to monitor vitals per protocol  CORONARY ATHEROSCLEROSIS NATIVE CORONARY ARTERY  - continue aspirin and Plavix  - continue atorvastatin 80 mg daily   CAROTID ARTERY STENOSIS, RIGHT  - on aspirin and plavix   PERIPHERAL EDEMA  - resume Lasix   Acute renal failure - imposed on chronic kidney disease stage I - resume Lasix and obtain BMP in AM  DYSLIPIDEMIA  - continue atorvastatin 80 mg daily, Ezetimibe 10 mg daily and fenofibrate 160 mg daily  - may continue lovaza 2 gm daily   DVT prophylaxis  - use SCD's bilaterally   Code Status: Full  Family Communication: Pt at bedside  Disposition Plan: PT evaluation - follow up on recomendations   Antibiotics:  Azithromycin 03/28/2013 -->  Ceftriaxone 03/28/2013 -->  Procedures/Studies: Dg Chest 2 View 03/28/2013   No acute cardiopulmonary disease.  Cardiomegaly with mild to moderate right hemidiaphragm elevation; secondary right infrahilar volume loss.     Ct Angio Chest Pe W/cm &/or Wo Cm 03/28/2013   No evidence of pulmonary embolism.  Suspected right lower lobe pneumonia.     HPI/Subjective: No events overnight.   Objective: Filed Vitals:   03/29/13 1400 03/29/13 1500 03/29/13 1600 03/29/13 1700  BP: 138/46  101/80   Pulse: 90 87 97 81  Temp:   98.2 F (36.8 C)   TempSrc:   Oral   Resp: 21 20 21 17   Height:      Weight:      SpO2: 99% 96% 98% 97%  Intake/Output Summary (Last 24 hours) at 03/29/13 1736 Last data filed at 03/29/13 1700  Gross per 24 hour  Intake   1220 ml  Output    900 ml  Net    320 ml    Exam:   General:  Pt is alert, follows commands appropriately, not in acute distress  Cardiovascular: Regular rate and rhythm, S1/S2, no murmurs, no rubs, no gallops  Respiratory: Clear to auscultation bilaterally, no wheezing, no crackles, rales at bases   Abdomen: Soft, non tender, non distended, bowel sounds present, no  guarding  Extremities: No edema, pulses DP and PT palpable bilaterally  Neuro: Grossly nonfocal  Data Reviewed: Basic Metabolic Panel:  Recent Labs Lab 03/28/13 0700 03/28/13 1426 03/29/13 0341  NA 138  --  138  K 3.6  --  3.5  CL 100  --  102  CO2 27  --  29  GLUCOSE 135*  --  104*  BUN 21  --  26*  CREATININE 1.33  --  1.67*  CALCIUM 9.3  --  8.9  MG  --  1.8  --   PHOS  --  2.8  --    Liver Function Tests:  Recent Labs Lab 03/29/13 0341  AST 24  ALT 17  ALKPHOS 21*  BILITOT 0.3  PROT 6.0  ALBUMIN 3.2*   CBC:  Recent Labs Lab 03/28/13 0700 03/29/13 0341  WBC 7.4 9.2  NEUTROABS 6.2  --   HGB 13.0 11.0*  HCT 38.6* 32.2*  MCV 95.1 94.7  PLT 138* 116*   CBG:  Recent Labs Lab 03/28/13 0638 03/29/13 0830  GLUCAP 144* 108*    Recent Results (from the past 240 hour(s))  CULTURE, BLOOD (ROUTINE X 2)     Status: None   Collection Time    03/28/13  8:00 AM      Result Value Range Status   Specimen Description BLOOD RIGHT ANTECUBITAL   Final   Special Requests BOTTLES DRAWN AEROBIC AND ANAEROBIC 4CC EACH   Final   Culture  Setup Time 03/28/2013 10:43   Final   Culture     Final   Value:        BLOOD CULTURE RECEIVED NO GROWTH TO DATE CULTURE WILL BE HELD FOR 5 DAYS BEFORE ISSUING A FINAL NEGATIVE REPORT   Report Status PENDING   Incomplete  CULTURE, BLOOD (ROUTINE X 2)     Status: None   Collection Time    03/28/13  8:01 AM      Result Value Range Status   Specimen Description BLOOD LEFT ANTECUBITAL   Final   Special Requests BOTTLES DRAWN AEROBIC AND ANAEROBIC 5CC EACH   Final   Culture  Setup Time 03/28/2013 10:43   Final   Culture     Final   Value:        BLOOD CULTURE RECEIVED NO GROWTH TO DATE CULTURE WILL BE HELD FOR 5 DAYS BEFORE ISSUING A FINAL NEGATIVE REPORT   Report Status PENDING   Incomplete  MRSA PCR SCREENING     Status: None   Collection Time    03/28/13 10:32 AM      Result Value Range Status   MRSA by PCR NEGATIVE  NEGATIVE  Final   Comment:            The GeneXpert MRSA Assay (FDA     approved for NASAL specimens     only), is one component of a     comprehensive MRSA  colonization     surveillance program. It is not     intended to diagnose MRSA     infection nor to guide or     monitor treatment for     MRSA infections.     Scheduled Meds: . aspirin  325 mg Oral Daily  . atorvastatin  80 mg Oral Daily  . azithromycin  500 mg Oral Daily  . cefTRIAXone  IV  1 g Intravenous Q24H  . celecoxib  200 mg Oral BID  . cilostazol  100 mg Oral BID  . clopidogrel  75 mg Oral Daily  . ezetimibe  10 mg Oral Daily  . fenofibrate  160 mg Oral Daily  . FLUoxetine  10 mg Oral Daily  . irbesartan  75 mg Oral Daily  . NIFEdipine  30 mg Oral Daily  . tamsulosin  0.4 mg Oral QHS   Continuous Infusions: . sodium chloride Stopped (03/28/13 1700)   Faye Ramsay, MD  [facility] Pager [phone]  If 7PM-7AM, please contact night-coverage [email or web address] Password [email or web address] 03/29/2013, 5:36 PM   LOS: 1 day

## 2013-03-29 NOTE — ED Provider Notes (Signed)
Medical screening examination/treatment/procedure(s) were conducted as a shared visit with non-physician practitioner(s) and myself.  I personally evaluated the patient during the encounter  Patient presents with fever and shortness of breath. Chest x-ray does not show definitive pneumonia however symptoms are consistent with respiratory infection. Patient has hypoxia and tachycardia. His vitals suggest early SIRS criteria. Patient will be started and IV antibiotics and admitted to the hospital for further treatment  Kathalene Frames, MD 03/29/13 [phone]

## 2013-03-30 LAB — BASIC METABOLIC PANEL
CO2: 30 mEq/L (ref 19–32)
Calcium: 8.7 mg/dL (ref 8.4–10.5)
Chloride: 102 mEq/L (ref 96–112)
GFR calc Af Amer: 55 mL/min — ABNORMAL LOW (ref >90)
Sodium: 139 mEq/L (ref 135–145)

## 2013-03-30 LAB — CBC
Platelets: 122 10*3/uL — ABNORMAL LOW (ref 150–400)
RBC: 3.32 MIL/uL — ABNORMAL LOW (ref 4.22–5.81)
WBC: 6.2 10*3/uL (ref 4.0–10.5)

## 2013-03-30 LAB — GLUCOSE, CAPILLARY: Glucose-Capillary: 98 mg/dL (ref 70–99)

## 2013-03-30 MED ORDER — LEVOFLOXACIN 500 MG PO TABS: 500.0000 mg | ORAL_TABLET | Freq: Every day | ORAL | Status: DC

## 2013-03-30 NOTE — Discharge Summary (Signed)
Physician Discharge Summary  Andrew [name] [id number] DOB: 07-19-1928 DOA: 03/28/2013  PCP: [name], MD  Admit date: 03/28/2013 Discharge date: 03/30/2013  Recommendations for Outpatient Follow-up:  1. Follow up with PCP in 1-2 weeks post discharge or sooner if symptoms persist   Discharge Diagnoses:  Principal Problem:   Acute respiratory failure with hypoxia Active Problems:   Other and unspecified hyperlipidemia   ANXIETY DEPRESSION   HYPERTENSION   CORONARY ATHEROSCLEROSIS NATIVE CORONARY ARTERY   CAROTID ARTERY STENOSIS, RIGHT   GERD   PERIPHERAL EDEMA   Anemia   Thrombocytopenia, unspecified   CAP (community acquired pneumonia)  Discharge Condition: medically stable for discharge home today  Diet recommendation: as tolerated  History of present illness:  79 year old male with past medical history including but not limited to hypertension, diabetes, CAD, hypothyroidism who presented to [facility] ED 03/28/2013 with progressively worsening shortness of breath over past few days prior to this admission associated with non productive cough and fevers.  In ED, patient was found to have O2 saturation of 91% on 2 L nasal canula. His BP was 158/81 and HR of 127. His CBC except for mild thrombocytopenia of 138 was essentially unremarkable.CXR showed no acute cardiopulmonary process. CT angio chest ruled out pulmonary embolism but it did reveal right lower lobe pneumonia and additional lymph nodes in subcarinal, hilar and mediastinal area thought to be of reactive etiology.   Assessment and Plan:  Principal Problem:  *Acute respiratory failure with hypoxia  - likely due to right lower lobe pneumonia as evidenced on CT angio chest. No evidence of pulmonary embolism  - blood culture results to date are all negative; strep pneumoniae, legionella nd flu test are all negative  - continue Levaquin on discharge for 10 days  Active Problems:  Thrombocytopenia  - likely reactive; platelet  count 122  - no sign so of active bleed  Peripheral vascular disease  - continue aspirin and pletal  ANXIETY AND DEPRESSION  - continue Prozac 10 mg daily  HYPERTENSION  - continue nifedipine 30 mg daily, Avapro 75 mg daily  CORONARY ATHEROSCLEROSIS NATIVE CORONARY ARTERY  - continue aspirin and Plavix  - continue atorvastatin 80 mg daily  CAROTID ARTERY STENOSIS, RIGHT  - on aspirin and plavix  PERIPHERAL EDEMA  - resumed Lasix 40 mg daily PO  Acute renal failure  - imposed on chronic kidney disease stage I  - resumed lasix  - renal function WNL  DYSLIPIDEMIA  - continue atorvastatin 80 mg daily, Ezetimibe 10 mg daily and fenofibrate 160 mg daily  - continue lovaza 2 gm daily  DVT prophylaxis  - used SCD's bilaterally   Code Status: Full  Family Communication: family not at bedside   Antibiotics:  Azithromycin 03/28/2013 --> 03/30/13 Ceftriaxone 03/28/2013 --> 03/30/13  Procedures/Studies:  Dg Chest 2 View 03/28/2013 No acute cardiopulmonary disease. Cardiomegaly with mild to moderate right hemidiaphragm elevation; secondary right infrahilar volume loss.  Ct Angio Chest Pe W/cm &/or Wo Cm 03/28/2013 No evidence of pulmonary embolism. Suspected right lower lobe pneumonia   Leisa Lenz, MD  [facility]  Pager [phone]   Discharge Exam: Filed Vitals:   03/30/13 0533  BP: 138/81  Pulse: 100  Temp: 98 F (36.7 C)  Resp: 20   Filed Vitals:   03/30/13 0500 03/30/13 0533 03/30/13 0815 03/30/13 1520  BP:  138/81    Pulse:  100    Temp:  98 F (36.7 C)    TempSrc:  Oral  Resp:  20    Height:      Weight: 89.994 kg (198 lb 6.4 oz)     SpO2:  95% 99% 97%    General: Pt is alert, follows commands appropriately, not in acute distress Cardiovascular: Regular rate and rhythm, S1/S2 +, no murmurs, no rubs, no gallops Respiratory: diminished breath sounds bilaterally, no wheezing, no crackles, no rhonchi Abdominal: Soft, non tender, non distended, bowel sounds +, no  guarding Extremities: no edema, no cyanosis, pulses palpable bilaterally DP and PT Neuro: Grossly nonfocal  Discharge Instructions  Discharge Orders   Future Appointments Provider Department Dept Phone   04/05/2013 9:00 AM [facility] Pv 4 [id number] [id number]   05/22/2013 2:15 PM Renella Cunas, MD [place]) [phone]   09/17/2013 10:30 AM [facility] Lab 1 [facility] [phone]   09/17/2013 11:00 AM Princess Perna, NP [facility] [phone]   Future Orders Complete By Expires     Call MD for:  difficulty breathing, headache or visual disturbances  As directed     Call MD for:  persistant dizziness or light-headedness  As directed     Call MD for:  persistant nausea and vomiting  As directed     Call MD for:  severe uncontrolled pain  As directed     Diet - low sodium heart healthy  As directed     Increase activity slowly  As directed         Medication List    TAKE these medications       ALPRAZolam 0.5 MG tablet  Commonly known as:  XANAX  Take 0.25-0.5 mg by mouth 3 (three) times daily as needed for sleep or anxiety.     aspirin 325 MG tablet  Take 325 mg by mouth daily.     atorvastatin 80 MG tablet  Commonly known as:  LIPITOR  Take 1 tablet (80 mg total) by mouth daily.     celecoxib 200 MG capsule  Commonly known as:  CELEBREX  Take 1 capsule (200 mg total) by mouth 2 (two) times daily.     cilostazol 100 MG tablet  Commonly known as:  PLETAL  Take 1 tablet (100 mg total) by mouth 2 (two) times daily.     clopidogrel 75 MG tablet  Commonly known as:  PLAVIX  Take 1 tablet (75 mg total) by mouth daily.     ezetimibe 10 MG tablet  Commonly known as:  ZETIA  Take 10 mg by mouth daily.     fenofibrate 145 MG tablet  Commonly known as:  TRICOR  Take 1 tablet (145 mg total) by mouth daily.     FLUoxetine 10 MG capsule  Commonly known as:  PROZAC  Take 1 capsule (10 mg  total) by mouth daily.     furosemide 40 MG tablet  Commonly known as:  LASIX  Take 40 mg by mouth daily.     glucosamine-chondroitin 500-400 MG tablet  Take 1 tablet by mouth 2 (two) times daily.     levofloxacin 500 MG tablet  Commonly known as:  LEVAQUIN  Take 1 tablet (500 mg total) by mouth daily.     NF FORMULAS TESTOSTERONE Caps  Take 1 capsule by mouth 2 (two) times daily.     NIFEdipine 30 MG 24 hr tablet  Commonly known as:  PROCARDIA-XL/ADALAT-CC/NIFEDICAL-XL  Take 30 mg by mouth daily.     nitroGLYCERIN 0.4 MG SL tablet  Commonly known as:  NITROSTAT  Place 0.4 mg under the tongue every 5 (five) minutes as needed for chest pain.     omega-3 acid ethyl esters 1 G capsule  Commonly known as:  LOVAZA  Take 1 g by mouth 2 (two) times daily.     potassium gluconate 595 MG Tabs  Take 595 mg by mouth daily.     tamsulosin 0.4 MG Caps  Commonly known as:  FLOMAX  Take 1 capsule (0.4 mg total) by mouth daily.     temazepam 30 MG capsule  Commonly known as:  RESTORIL  Take 30 mg by mouth at bedtime as needed for sleep.     valsartan 80 MG tablet  Commonly known as:  DIOVAN  Take 80 mg by mouth daily.           Follow-up Information   Schedule an appointment as soon as possible for a visit with [name], MD.   Contact information:   [address], [address] [place] [address] [phone]        The results of significant diagnostics from this hospitalization (including imaging, microbiology, ancillary and laboratory) are listed below for reference.    Significant Diagnostic Studies: Dg Chest 2 View  03/28/2013  *RADIOLOGY REPORT*  Clinical Data: Cough.  Congestion.  Shortness of breath and fever for 2 days.  Coronary artery disease.  Diabetes.  CHEST - 2 VIEW  Comparison: 01/24/2003  Findings: Lateral view degraded by patient arm position.  Prior median sternotomy. Midline trachea.  Moderate cardiomegaly. Mild to moderate right  hemidiaphragm elevation. No pleural effusion or pneumothorax.  Mild right infrahilar volume loss, slightly increased. No lobar consolidation.  Mild basilar interstitial thickening which is nonspecific.  IMPRESSION: No acute cardiopulmonary disease.  Cardiomegaly with mild to moderate right hemidiaphragm elevation; secondary right infrahilar volume loss.   Original Report Authenticated By: Abigail Miyamoto, M.D.    Ct Angio Chest Pe W/cm &/or Wo Cm  03/28/2013  *RADIOLOGY REPORT*  Clinical Data: Fever, shortness of breath, history of AAA repair, evaluate for PE  CT ANGIOGRAPHY CHEST  Technique:  Multidetector CT imaging of the chest using the standard protocol during bolus administration of intravenous contrast. Multiplanar reconstructed images including MIPs were obtained and reviewed to evaluate the vascular anatomy.  Contrast: 176mL OMNIPAQUE IOHEXOL 350 MG/ML SOLN  Comparison: CTA chest dated 08/14/2005  Findings: No evidence of pulmonary embolism.  Mild patchy right lower lobe opacity, suspicious for pneumonia. Minimal patchy/nodular opacity in the right middle lobe (series 7/image 64), likely infectious/inflammatory. No pleural effusion or pneumothorax.  Visualized thyroid is grossly unremarkable.  The heart is normal in size.  No pericardial effusion.  Coronary atherosclerosis. Postsurgical changes related to prior CABG. Atherosclerotic calcifications of the aortic arch.  11 mm short-axis subcarinal node (series 4/image 52).  Additional subcentimeter mediastinal nodes.  9 mm short-axis right hilar node (series 4/image 56).  Findings are favored to be reactive.  No suspicious axillary lymphadenopathy.  Visualized upper abdomen is grossly unremarkable.  Degenerative changes of the visualized thoracolumbar spine.  IMPRESSION: No evidence of pulmonary embolism.  Suspected right lower lobe pneumonia.   Original Report Authenticated By: Julian Hy, M.D.     Microbiology: Recent Results (from the past 240  hour(s))  CULTURE, BLOOD (ROUTINE X 2)     Status: None   Collection Time    03/28/13  8:00 AM      Result Value Range Status   Specimen  Description BLOOD RIGHT ANTECUBITAL   Final   Special Requests BOTTLES DRAWN AEROBIC AND ANAEROBIC 4CC EACH   Final   Culture  Setup Time 03/28/2013 10:43   Final   Culture     Final   Value:        BLOOD CULTURE RECEIVED NO GROWTH TO DATE CULTURE WILL BE HELD FOR 5 DAYS BEFORE ISSUING A FINAL NEGATIVE REPORT   Report Status PENDING   Incomplete  CULTURE, BLOOD (ROUTINE X 2)     Status: None   Collection Time    03/28/13  8:01 AM      Result Value Range Status   Specimen Description BLOOD LEFT ANTECUBITAL   Final   Special Requests BOTTLES DRAWN AEROBIC AND ANAEROBIC 5CC EACH   Final   Culture  Setup Time 03/28/2013 10:43   Final   Culture     Final   Value:        BLOOD CULTURE RECEIVED NO GROWTH TO DATE CULTURE WILL BE HELD FOR 5 DAYS BEFORE ISSUING A FINAL NEGATIVE REPORT   Report Status PENDING   Incomplete  MRSA PCR SCREENING     Status: None   Collection Time    03/28/13 10:32 AM      Result Value Range Status   MRSA by PCR NEGATIVE  NEGATIVE Final   Comment:            The GeneXpert MRSA Assay (FDA     approved for NASAL specimens     only), is one component of a     comprehensive MRSA colonization     surveillance program. It is not     intended to diagnose MRSA     infection nor to guide or     monitor treatment for     MRSA infections.     Labs: Basic Metabolic Panel:  Recent Labs Lab 03/28/13 0700 03/28/13 1426 03/29/13 0341 03/30/13 0455  NA 138  --  138 139  K 3.6  --  3.5 3.9  CL 100  --  102 102  CO2 27  --  29 30  GLUCOSE 135*  --  104* 133*  BUN 21  --  26* 28*  CREATININE 1.33  --  1.67* 1.35  CALCIUM 9.3  --  8.9 8.7  MG  --  1.8  --   --   PHOS  --  2.8  --   --    Liver Function Tests:  Recent Labs Lab 03/29/13 0341  AST 24  ALT 17  ALKPHOS 21*  BILITOT 0.3  PROT 6.0  ALBUMIN 3.2*   No  results found for this basename: LIPASE, AMYLASE,  in the last 168 hours No results found for this basename: AMMONIA,  in the last 168 hours CBC:  Recent Labs Lab 03/28/13 0700 03/29/13 0341 03/30/13 0455  WBC 7.4 9.2 6.2  NEUTROABS 6.2  --   --   HGB 13.0 11.0* 10.6*  HCT 38.6* 32.2* 31.3*  MCV 95.1 94.7 94.3  PLT 138* 116* 122*   Cardiac Enzymes: No results found for this basename: CKTOTAL, CKMB, CKMBINDEX, TROPONINI,  in the last 168 hours BNP: BNP (last 3 results)  Recent Labs  03/28/13 0700  PROBNP 484.7*   CBG:  Recent Labs Lab 03/28/13 0638 03/29/13 0830 03/30/13 0803  GLUCAP 144* 108* 98    Time coordinating discharge: Over 30 minutes  Signed:  Leisa Lenz, MD  [facility]  03/30/2013, 4:55 PM  Pager #:  [phone]   

## 2013-03-30 NOTE — Progress Notes (Addendum)
At shift change, I noticed patient had cardiac monitoring orders to be continued upon transfer to floor.  Since this is not a tele floor , triad hospitalist, T. Rogue Bussing was notified.  Orders were received to transfer patient to a tele floor d/t heart history.  Patient was assessed, given meds, and sent to room 1409 via wheelchair by 2 RN techs.  Patients vitals and condition stable. Report given to [place], 4th floor RN.Azzie Glatter [place]

## 2013-03-30 NOTE — Progress Notes (Signed)
[facility] PROGRESS NOTE  DANILO MCGREGOR [id number] DOB: 17-Sep-1928 DOA: 03/28/2013 PCP: [name], MD  Brief narrative: 79 year old male with past medical history including but not limited to hypertension, diabetes, CAD, hypothyroidism who presented to [facility] ED 03/28/2013 with progressively worsening shortness of breath over past few days prior to this admission associated with non productive cough and fevers.  In ED, patient was found to have O2 saturation of 91% on 2 L nasal canula. His BP was 158/81 and HR of 127. His CBC except for mild thrombocytopenia of 138 was essentially unremarkable.CXR showed no acute cardiopulmonary process. CT angio chest ruled out pulmonary embolism but it did reveal right lower lobe pneumonia and additional lymph nodes in subcarinal, hilar and mediastinal area thought to be of reactive etiology.   Assessment and Plan:   Principal Problem:  *Acute respiratory failure with hypoxia  - likely due to right lower lobe pneumonia as evidenced on CT angio chest. No evidence of pulmonary embolism  - blood culture results to date are all negative; strep pneumoniae, legionella nd flu test are all negative - continue azithromycin and ceftriaxone  - albuterol and atrovent nebulizers as needed every 4 hours  - Oxygen support via nasal canula to keep O2 saturation above 90%  Active Problems:  Thrombocytopenia  - likely reactive; platelet count 122 today - no sign so f active bleed Peripheral vascular disease  - continue aspirin and pletal  ANXIETY AND DEPRESSION  - continue Prozac 10 mg daily  HYPERTENSION  - continue nifedipine 30 mg daily, Avapro 75 mg daily  CORONARY ATHEROSCLEROSIS NATIVE CORONARY ARTERY  - continue aspirin and Plavix  - continue atorvastatin 80 mg daily  CAROTID ARTERY STENOSIS, RIGHT  - on aspirin and plavix  PERIPHERAL EDEMA  - resumed Lasix 40 mg daily PO Acute renal failure  - imposed on chronic kidney disease stage I  -  resumed lasix - renal function WNL DYSLIPIDEMIA  - continue atorvastatin 80 mg daily, Ezetimibe 10 mg daily and fenofibrate 160 mg daily  - may continue lovaza 2 gm daily  DVT prophylaxis  - use SCD's bilaterally   Code Status: Full  Family Communication: family not at bedside Disposition Plan: home when stable  Antibiotics:   Azithromycin 03/28/2013 -->  Ceftriaxone 03/28/2013 --> Procedures/Studies:   Dg Chest 2 View 03/28/2013 No acute cardiopulmonary disease. Cardiomegaly with mild to moderate right hemidiaphragm elevation; secondary right infrahilar volume loss.   Ct Angio Chest Pe W/cm &/or Wo Cm 03/28/2013  No evidence of pulmonary embolism. Suspected right lower lobe pneumonia  Leisa Lenz, MD  [facility] Pager [phone]  If 7PM-7AM, please contact night-coverage [email or web address] Password [facility] 03/30/2013, 7:17 AM   LOS: 2 days   HPI/Subjective: No acute overnight events.  Objective: Filed Vitals:   03/29/13 1700 03/29/13 2144 03/30/13 0500 03/30/13 0533  BP:  147/59  138/81  Pulse: 81 87  100  Temp:  98.4 F (36.9 C)  98 F (36.7 C)  TempSrc:  Oral  Oral  Resp: 17 18  20   Height:      Weight:   89.994 kg (198 lb 6.4 oz)   SpO2: 97% 95%  95%    Intake/Output Summary (Last 24 hours) at 03/30/13 0717 Last data filed at 03/30/13 0630  Gross per 24 hour  Intake    860 ml  Output   1050 ml  Net   -190 ml    Exam:   General:  Pt is sleeping, not  in acute distress  Cardiovascular: Regular rate and rhythm, S1/S2 appreciated  Respiratory: mild wheezing in upper lobes  Abdomen: Soft, non tender, non distended, bowel sounds present, no guarding  Extremities: No edema, pulses DP and PT palpable bilaterally  Neuro: Grossly nonfocal  Data Reviewed: Basic Metabolic Panel:  Recent Labs Lab 03/28/13 0700 03/28/13 1426 03/29/13 0341 03/30/13 0455  NA 138  --  138 139  K 3.6  --  3.5 3.9  CL 100  --  102 102  CO2 27  --  29 30  GLUCOSE 135*  --  104* 133*  BUN  21  --  26* 28*  CREATININE 1.33  --  1.67* 1.35  CALCIUM 9.3  --  8.9 8.7  MG  --  1.8  --   --   PHOS  --  2.8  --   --    Liver Function Tests:  Recent Labs Lab 03/29/13 0341  AST 24  ALT 17  ALKPHOS 21*  BILITOT 0.3  PROT 6.0  ALBUMIN 3.2*   No results found for this basename: LIPASE, AMYLASE,  in the last 168 hours No results found for this basename: AMMONIA,  in the last 168 hours CBC:  Recent Labs Lab 03/28/13 0700 03/29/13 0341 03/30/13 0455  WBC 7.4 9.2 6.2  NEUTROABS 6.2  --   --   HGB 13.0 11.0* 10.6*  HCT 38.6* 32.2* 31.3*  MCV 95.1 94.7 94.3  PLT 138* 116* 122*   Cardiac Enzymes: No results found for this basename: CKTOTAL, CKMB, CKMBINDEX, TROPONINI,  in the last 168 hours BNP: No components found with this basename: POCBNP,  CBG:  Recent Labs Lab 03/28/13 0638 03/29/13 0830  GLUCAP 144* 108*    CULTURE, BLOOD (ROUTINE X 2)     Status: None   Collection Time    03/28/13  8:00 AM      Result Value Range Status   Culture  Setup Time 03/28/2013 10:43   Final   Culture     Final   Value:        BLOOD CULTURE RECEIVED NO GROWTH TO DATE    Report Status PENDING   Incomplete  CULTURE, BLOOD (ROUTINE X 2)     Status: None   Collection Time    03/28/13  8:01 AM      Result Value Range Status   Culture  Setup Time 03/28/2013 10:43   Final   Culture     Final   Value:        BLOOD CULTURE RECEIVED NO GROWTH TO DATE    Report Status PENDING   Incomplete  MRSA PCR SCREENING     Status: None   Collection Time    03/28/13 10:32 AM      Result Value Range Status   MRSA by PCR NEGATIVE  NEGATIVE Final     Studies: Ct Angio Chest Pe W/cm &/or Wo Cm 03/28/2013   IMPRESSION: No evidence of pulmonary embolism.  Suspected right lower lobe pneumonia.      Scheduled Meds: . aspirin  325 mg Oral Daily  . atorvastatin  80 mg Oral Daily  . azithromycin  500 mg Oral Daily  . cefTRIAXone   1 g Intravenous Q24H  . celecoxib  200 mg Oral BID  . cilostazol   100 mg Oral BID  . clopidogrel  75 mg Oral Daily  . ezetimibe  10 mg Oral Daily  . fenofibrate  160 mg Oral Daily  .  FLUoxetine  10 mg Oral Daily  . furosemide  40 mg Oral Daily  . irbesartan  75 mg Oral Daily  . NIFEdipine  30 mg Oral Daily  . omega-3 acid ethyl est  2 g Oral Daily  . tamsulosin  0.4 mg Oral QHS

## 2013-04-01 NOTE — Telephone Encounter (Signed)
PA faxed back to insurance

## 2013-04-02 NOTE — Telephone Encounter (Signed)
Medication approved per insurance and pharmacy notified

## 2013-04-03 ENCOUNTER — Other Ambulatory Visit: Payer: Self-pay | Admitting: Cardiology

## 2013-04-03 LAB — CULTURE, BLOOD (ROUTINE X 2)
Culture: NO GROWTH
Culture: NO GROWTH

## 2013-04-04 ENCOUNTER — Ambulatory Visit (INDEPENDENT_AMBULATORY_CARE_PROVIDER_SITE_OTHER): Payer: Medicare PPO | Admitting: Internal Medicine

## 2013-04-04 ENCOUNTER — Ambulatory Visit (INDEPENDENT_AMBULATORY_CARE_PROVIDER_SITE_OTHER)
Admission: RE | Admit: 2013-04-04 | Discharge: 2013-04-04 | Disposition: A | Discharge status: Home / Self Care | Payer: Medicare PPO | Source: Ambulatory Visit | Attending: Internal Medicine | Admitting: Internal Medicine

## 2013-04-04 ENCOUNTER — Encounter: Payer: Self-pay | Admitting: Internal Medicine

## 2013-04-04 VITALS — BP 110/58 | HR 91 | Temp 97.1°F | Resp 16 | Wt 192.0 lb

## 2013-04-04 DIAGNOSIS — Z23 Encounter for immunization: Secondary | ICD-10-CM

## 2013-04-04 DIAGNOSIS — E1129 Type 2 diabetes mellitus with other diabetic kidney complication: Secondary | ICD-10-CM | POA: Insufficient documentation

## 2013-04-04 DIAGNOSIS — E1165 Type 2 diabetes mellitus with hyperglycemia: Secondary | ICD-10-CM

## 2013-04-04 DIAGNOSIS — N2889 Other specified disorders of kidney and ureter: Secondary | ICD-10-CM

## 2013-04-04 DIAGNOSIS — IMO0002 Reserved for concepts with insufficient information to code with codable children: Secondary | ICD-10-CM

## 2013-04-04 DIAGNOSIS — N182 Chronic kidney disease, stage 2 (mild): Secondary | ICD-10-CM | POA: Insufficient documentation

## 2013-04-04 DIAGNOSIS — J189 Pneumonia, unspecified organism: Secondary | ICD-10-CM

## 2013-04-04 DIAGNOSIS — I10 Essential (primary) hypertension: Secondary | ICD-10-CM

## 2013-04-04 DIAGNOSIS — E785 Hyperlipidemia, unspecified: Secondary | ICD-10-CM

## 2013-04-04 DIAGNOSIS — E118 Type 2 diabetes mellitus with unspecified complications: Secondary | ICD-10-CM

## 2013-04-04 NOTE — Assessment & Plan Note (Signed)
Will control BP and BS He will avoid nephrotoxic meds

## 2013-04-04 NOTE — Addendum Note (Signed)
Addended by: Estell Harpin T on: 04/04/2013 11:15 AM   Modules accepted: Orders

## 2013-04-04 NOTE — Progress Notes (Signed)
  Subjective:    Patient ID: Andrew [name], male    DOB: 04/22/1928, 79 y.o.   MRN: [id number]  Cough This is a recurrent problem. The current episode started 1 to 4 weeks ago. The problem has been resolved. Pertinent negatives include no chest pain, chills, ear congestion, ear pain, fever, headaches, heartburn, hemoptysis, myalgias, nasal congestion, postnasal drip, rash, rhinorrhea, sore throat, shortness of breath, sweats, weight loss or wheezing. Treatments tried: levaquin. The treatment provided significant relief. His past medical history is significant for pneumonia.      Review of Systems  Constitutional: Negative for fever, chills and weight loss.  HENT: Negative for ear pain, sore throat, rhinorrhea and postnasal drip.   Respiratory: Positive for cough. Negative for hemoptysis, shortness of breath and wheezing.   Cardiovascular: Negative for chest pain.  Gastrointestinal: Negative for heartburn.  Musculoskeletal: Negative for myalgias.  Skin: Negative for rash.  Neurological: Negative for headaches.       Objective:   Physical Exam    Lab Results  Component Value Date   WBC 6.2 03/30/2013   HGB 10.6* 03/30/2013   HCT 31.3* 03/30/2013   PLT 122* 03/30/2013   GLUCOSE 133* 03/30/2013   CHOL 96 01/23/2013   TRIG 140.0 01/23/2013   HDL 37.70* 01/23/2013   LDLDIRECT 78.6 03/18/2010   LDLCALC 30 01/23/2013   ALT 17 03/29/2013   AST 24 03/29/2013   NA 139 03/30/2013   K 3.9 03/30/2013   CL 102 03/30/2013   CREATININE 1.35 03/30/2013   BUN 28* 03/30/2013   CO2 30 03/30/2013   TSH 1.416 03/28/2013   PSA 2.79 01/23/2013   INR 1.10 03/28/2013   HGBA1C 6.8* 02/14/2013   MICROALBUR 45.5* 03/18/2010      Assessment & Plan:

## 2013-04-04 NOTE — Patient Instructions (Signed)
Pneumonia, Adult °Pneumonia is an infection of the lungs.  °CAUSES °Pneumonia may be caused by bacteria or a virus. Usually, these infections are caused by breathing infectious particles into the lungs (respiratory tract). °SYMPTOMS  °· Cough. °· Fever. °· Chest pain. °· Increased rate of breathing. °· Wheezing. °· Mucus production. °DIAGNOSIS  °If you have the common symptoms of pneumonia, your caregiver will typically confirm the diagnosis with a chest X-ray. The X-ray will show an abnormality in the lung (pulmonary infiltrate) if you have pneumonia. Other tests of your blood, urine, or sputum may be done to find the specific cause of your pneumonia. Your caregiver may also do tests (blood gases or pulse oximetry) to see how well your lungs are working. °TREATMENT  °Some forms of pneumonia may be spread to other people when you cough or sneeze. You may be asked to wear a mask before and during your exam. Pneumonia that is caused by bacteria is treated with antibiotic medicine. Pneumonia that is caused by the influenza virus may be treated with an antiviral medicine. Most other viral infections must run their course. These infections will not respond to antibiotics.  °PREVENTION °A pneumococcal shot (vaccine) is available to prevent a common bacterial cause of pneumonia. This is usually suggested for: °· People over 65 years old. °· Patients on chemotherapy. °· People with chronic lung problems, such as bronchitis or emphysema. °· People with immune system problems. °If you are over 65 or have a high risk condition, you may receive the pneumococcal vaccine if you have not received it before. In some countries, a routine influenza vaccine is also recommended. This vaccine can help prevent some cases of pneumonia. You may be offered the influenza vaccine as part of your care. °If you smoke, it is time to quit. You may receive instructions on how to stop smoking. Your caregiver can provide medicines and counseling to  help you quit. °HOME CARE INSTRUCTIONS  °· Cough suppressants may be used if you are losing too much rest. However, coughing protects you by clearing your lungs. You should avoid using cough suppressants if you can. °· Your caregiver may have prescribed medicine if he or she thinks your pneumonia is caused by a bacteria or influenza. Finish your medicine even if you start to feel better. °· Your caregiver may also prescribe an expectorant. This loosens the mucus to be coughed up. °· Only take over-the-counter or prescription medicines for pain, discomfort, or fever as directed by your caregiver. °· Do not smoke. Smoking is a common cause of bronchitis and can contribute to pneumonia. If you are a smoker and continue to smoke, your cough may last several weeks after your pneumonia has cleared. °· A cold steam vaporizer or humidifier in your room or home may help loosen mucus. °· Coughing is often worse at night. Sleeping in a semi-upright position in a recliner or using a couple pillows under your head will help with this. °· Get rest as you feel it is needed. Your body will usually let you know when you need to rest. °SEEK IMMEDIATE MEDICAL CARE IF:  °· Your illness becomes worse. This is especially true if you are elderly or weakened from any other disease. °· You cannot control your cough with suppressants and are losing sleep. °· You begin coughing up blood. °· You develop pain which is getting worse or is uncontrolled with medicines. °· You have a fever. °· Any of the symptoms which initially brought you in for treatment   You begin coughing up blood.   You develop pain which is getting worse or is uncontrolled with medicines.   You have a fever.   Any of the symptoms which initially brought you in for treatment are getting worse rather than better.   You develop shortness of breath or chest pain.  MAKE SURE YOU:    Understand these instructions.   Will watch your condition.   Will get help right away if you are not doing well or get worse.  Document Released: 12/12/2005 Document Revised: 03/05/2012 Document Reviewed: 03/03/2011  [employer] Patient Information 2013 [employer].

## 2013-04-04 NOTE — Assessment & Plan Note (Signed)
His BP is well controlled 

## 2013-04-04 NOTE — Assessment & Plan Note (Signed)
He appears to be doing very well His CXR shows that this is clearing

## 2013-04-04 NOTE — Assessment & Plan Note (Signed)
He has achieved this goal

## 2013-04-05 ENCOUNTER — Encounter (INDEPENDENT_AMBULATORY_CARE_PROVIDER_SITE_OTHER): Payer: Medicare PPO

## 2013-04-05 DIAGNOSIS — I6529 Occlusion and stenosis of unspecified carotid artery: Secondary | ICD-10-CM

## 2013-04-17 ENCOUNTER — Telehealth: Payer: Self-pay

## 2013-04-17 DIAGNOSIS — H9192 Unspecified hearing loss, left ear: Secondary | ICD-10-CM

## 2013-04-17 NOTE — Telephone Encounter (Signed)
Pt has appt for hearing evaluation and need a referral sent to [facility]

## 2013-04-17 NOTE — Telephone Encounter (Signed)
done

## 2013-05-14 ENCOUNTER — Ambulatory Visit (INDEPENDENT_AMBULATORY_CARE_PROVIDER_SITE_OTHER): Payer: Medicare PPO | Admitting: Internal Medicine

## 2013-05-14 ENCOUNTER — Encounter: Payer: Self-pay | Admitting: Internal Medicine

## 2013-05-14 VITALS — BP 126/64 | HR 78 | Temp 97.6°F | Resp 16 | Wt 188.2 lb

## 2013-05-14 DIAGNOSIS — R270 Ataxia, unspecified: Secondary | ICD-10-CM

## 2013-05-14 DIAGNOSIS — R279 Unspecified lack of coordination: Secondary | ICD-10-CM

## 2013-05-14 DIAGNOSIS — R42 Dizziness and giddiness: Secondary | ICD-10-CM

## 2013-05-14 MED ORDER — MECLIZINE HCL 25 MG PO TABS: 25.0000 mg | ORAL_TABLET | Freq: Three times a day (TID) | ORAL | Status: DC | PRN

## 2013-05-14 NOTE — Assessment & Plan Note (Signed)
I have ordered an MRI of the brain to see if he has hydrocephalus, CVA, mass, tumor, atrophy, demyelination

## 2013-05-14 NOTE — Progress Notes (Signed)
Subjective:    Patient ID: Andrew [name], male    DOB: [birth year], 79 y.o.   MRN: [id number]  HPI  He returns c/o a one month hx of ataxia, dizziness, and mild vertigo.  Review of Systems  Constitutional: Negative.   HENT: Negative.   Eyes: Negative.   Respiratory: Negative.  Negative for cough, chest tightness, shortness of breath, wheezing and stridor.   Cardiovascular: Negative.  Negative for chest pain and leg swelling.  Gastrointestinal: Negative.   Endocrine: Negative.   Genitourinary: Negative.   Musculoskeletal: Positive for gait problem. Negative for myalgias, back pain and joint swelling.  Skin: Negative.   Allergic/Immunologic: Negative.   Neurological: Positive for dizziness. Negative for tremors, seizures, syncope, facial asymmetry, speech difficulty, weakness, light-headedness, numbness and headaches.  Hematological: Negative.  Negative for adenopathy. Does not bruise/bleed easily.  Psychiatric/Behavioral: Negative.        Objective:   Physical Exam  Vitals reviewed. Constitutional: He is oriented to person, place, and time. He appears well-developed and well-nourished. No distress.  HENT:  Head: Normocephalic and atraumatic.  Mouth/Throat: Oropharynx is clear and moist. No oropharyngeal exudate.  Eyes: Conjunctivae and EOM are normal. Pupils are equal, round, and reactive to light. Right eye exhibits no discharge. Left eye exhibits no discharge. No scleral icterus.  Neck: Normal range of motion. Neck supple. No JVD present. No tracheal deviation present. No thyromegaly present.  Cardiovascular: Normal rate, regular rhythm, normal heart sounds and intact distal pulses.  Exam reveals no gallop and no friction rub.   No murmur heard. Pulmonary/Chest: Effort normal and breath sounds normal. No stridor. No respiratory distress. He has no wheezes. He has no rales. He exhibits no tenderness.  Abdominal: Soft. Bowel sounds are normal. He exhibits no distension and no  mass. There is no tenderness. There is no rebound and no guarding.  Musculoskeletal: Normal range of motion. He exhibits no edema and no tenderness.  Lymphadenopathy:    He has no cervical adenopathy.  Neurological: He is alert and oriented to person, place, and time. He displays no atrophy, no tremor and normal reflexes. No cranial nerve deficit or sensory deficit. He exhibits abnormal muscle tone (mild diffuse weakness). He displays a negative Romberg sign. He displays no seizure activity. Coordination and gait (mild ataxia) abnormal. He displays no Babinski's sign on the right side. He displays no Babinski's sign on the left side.  Reflex Scores:      Tricep reflexes are 0 on the right side and 0 on the left side.      Bicep reflexes are 0 on the right side and 0 on the left side.      Brachioradialis reflexes are 0 on the right side and 0 on the left side.      Patellar reflexes are 0 on the right side and 0 on the left side.      Achilles reflexes are 0 on the right side and 0 on the left side. Skin: Skin is warm and dry. No rash noted. He is not diaphoretic. No erythema. No pallor.  Psychiatric: He has a normal mood and affect. His behavior is normal. Judgment and thought content normal.     Lab Results  Component Value Date   WBC 6.2 03/30/2013   HGB 10.6* 03/30/2013   HCT 31.3* 03/30/2013   PLT 122* 03/30/2013   GLUCOSE 133* 03/30/2013   CHOL 96 01/23/2013   TRIG 140.0 01/23/2013   HDL 37.70* 01/23/2013   LDLDIRECT 78.6 03/18/2010  LDLCALC 30 01/23/2013   ALT 17 03/29/2013   AST 24 03/29/2013   NA 139 03/30/2013   K 3.9 03/30/2013   CL 102 03/30/2013   CREATININE 1.35 03/30/2013   BUN 28* 03/30/2013   CO2 30 03/30/2013   TSH 1.416 03/28/2013   PSA 2.79 01/23/2013   INR 1.10 03/28/2013   HGBA1C 6.8* 02/14/2013   MICROALBUR 45.5* 03/18/2010       Assessment & Plan:

## 2013-05-14 NOTE — Patient Instructions (Signed)
Vertigo Vertigo means you feel like you or your surroundings are moving when they are not. Vertigo can be dangerous if it occurs when you are at work, driving, or performing difficult activities.  CAUSES  Vertigo occurs when there is a conflict of signals sent to your brain from the visual and sensory systems in your body. There are many different causes of vertigo, including:  Infections, especially in the inner ear.  A bad reaction to a drug or misuse of alcohol and medicines.  Withdrawal from drugs or alcohol.  Rapidly changing positions, such as lying down or rolling over in bed.  A migraine headache.  Decreased blood flow to the brain.  Increased pressure in the brain from a head injury, infection, tumor, or bleeding. SYMPTOMS  You may feel as though the world is spinning around or you are falling to the ground. Because your balance is upset, vertigo can cause nausea and vomiting. You may have involuntary eye movements (nystagmus). DIAGNOSIS  Vertigo is usually diagnosed by physical exam. If the cause of your vertigo is unknown, your caregiver may perform imaging tests, such as an MRI scan (magnetic resonance imaging). TREATMENT  Most cases of vertigo resolve on their own, without treatment. Depending on the cause, your caregiver may prescribe certain medicines. If your vertigo is related to body position issues, your caregiver may recommend movements or procedures to correct the problem. In rare cases, if your vertigo is caused by certain inner ear problems, you may need surgery. HOME CARE INSTRUCTIONS   Follow your caregiver's instructions.  Avoid driving.  Avoid operating heavy machinery.  Avoid performing any tasks that would be dangerous to you or others during a vertigo episode.  Tell your caregiver if you notice that certain medicines seem to be causing your vertigo. Some of the medicines used to treat vertigo episodes can actually make them worse in some people. SEEK  IMMEDIATE MEDICAL CARE IF:   Your medicines do not relieve your vertigo or are making it worse.  You develop problems with talking, walking, weakness, or using your arms, hands, or legs.  You develop severe headaches.  Your nausea or vomiting continues or gets worse.  You develop visual changes.  A family member notices behavioral changes.  Your condition gets worse. MAKE SURE YOU:  Understand these instructions.  Will watch your condition.  Will get help right away if you are not doing well or get worse. Document Released: 09/21/2005 Document Revised: 03/05/2012 Document Reviewed: 06/30/2011 [employer] Patient Information 2013 [employer].  

## 2013-05-14 NOTE — Assessment & Plan Note (Signed)
I reviewed his recent labs and see nothing that would explain dizziness Will try meclizine as needed for now for symptom relief

## 2013-05-22 ENCOUNTER — Ambulatory Visit
Admission: RE | Admit: 2013-05-22 | Discharge: 2013-05-22 | Disposition: A | Discharge status: Home / Self Care | Payer: Medicare PPO | Source: Ambulatory Visit | Attending: Internal Medicine | Admitting: Internal Medicine

## 2013-05-22 ENCOUNTER — Encounter: Payer: Self-pay | Admitting: Internal Medicine

## 2013-05-22 ENCOUNTER — Ambulatory Visit (INDEPENDENT_AMBULATORY_CARE_PROVIDER_SITE_OTHER): Payer: Medicare PPO | Admitting: Cardiology

## 2013-05-22 ENCOUNTER — Encounter: Payer: Self-pay | Admitting: Cardiology

## 2013-05-22 VITALS — BP 122/64 | HR 92 | Ht 67.0 in | Wt 189.4 lb

## 2013-05-22 DIAGNOSIS — I2510 Atherosclerotic heart disease of native coronary artery without angina pectoris: Secondary | ICD-10-CM

## 2013-05-22 DIAGNOSIS — R42 Dizziness and giddiness: Secondary | ICD-10-CM

## 2013-05-22 DIAGNOSIS — I6529 Occlusion and stenosis of unspecified carotid artery: Secondary | ICD-10-CM

## 2013-05-22 DIAGNOSIS — N182 Chronic kidney disease, stage 2 (mild): Secondary | ICD-10-CM

## 2013-05-22 DIAGNOSIS — E1165 Type 2 diabetes mellitus with hyperglycemia: Secondary | ICD-10-CM

## 2013-05-22 DIAGNOSIS — Z9889 Other specified postprocedural states: Secondary | ICD-10-CM

## 2013-05-22 DIAGNOSIS — E669 Obesity, unspecified: Secondary | ICD-10-CM

## 2013-05-22 DIAGNOSIS — E118 Type 2 diabetes mellitus with unspecified complications: Secondary | ICD-10-CM

## 2013-05-22 DIAGNOSIS — R270 Ataxia, unspecified: Secondary | ICD-10-CM

## 2013-05-22 NOTE — Progress Notes (Signed)
HPI Andrew [name] returns today for evaluation and management his coronary artery disease, carotid artery stenosis, hypertension, hyperlipidemia, and abdominal aortic aneurysm.  He denies any angina or ischemic symptoms. His blood work and blood pressure been under good control. He remains very active in his garden and playing piano. He has lost 35 pounds.  Past Medical History  Diagnosis Date  . CAD (coronary artery disease)   . LVF (left ventricular failure)   . Carotid artery stenosis   . Hyperlipidemia   . Hypertension   . GERD (gastroesophageal reflux disease)   . IBS (irritable bowel syndrome)   . Arthritis   . Diabetes mellitus   . Thyroid disease   . Anemia   . Vitamin D deficiency   . Depression   . Fatty liver 2008  . Cancer     Melanoma - Back  . AAA (abdominal aortic aneurysm)     Current Outpatient Prescriptions  Medication Sig Dispense Refill  . ALPRAZolam (XANAX) 0.5 MG tablet Take 0.25-0.5 mg by mouth 3 (three) times daily as needed for sleep or anxiety.      Marland Kitchen aspirin 325 MG tablet Take 325 mg by mouth daily.        Marland Kitchen atorvastatin (LIPITOR) 80 MG tablet Take 1 tablet (80 mg total) by mouth daily.  90 tablet  1  . celecoxib (CELEBREX) 200 MG capsule Take 1 capsule (200 mg total) by mouth 2 (two) times daily.  180 capsule  3  . cilostazol (PLETAL) 100 MG tablet Take 1 tablet (100 mg total) by mouth 2 (two) times daily.  180 tablet  3  . clopidogrel (PLAVIX) 75 MG tablet Take 1 tablet (75 mg total) by mouth daily.  90 tablet  3  . ezetimibe (ZETIA) 10 MG tablet Take 10 mg by mouth daily.      . fenofibrate (TRICOR) 145 MG tablet Take 1 tablet (145 mg total) by mouth daily.  90 tablet  3  . FLUoxetine (PROZAC) 10 MG capsule Take 1 capsule (10 mg total) by mouth daily.  90 capsule  3  . furosemide (LASIX) 40 MG tablet Take 40 mg by mouth daily.      Marland Kitchen glucosamine-chondroitin 500-400 MG tablet Take 1 tablet by mouth 2 (two) times daily.        . meclizine (ANTIVERT) 25 MG  tablet Take 1 tablet (25 mg total) by mouth 3 (three) times daily as needed.  60 tablet  1  . Misc Natural Products (NF FORMULAS TESTOSTERONE) CAPS Take 1 capsule by mouth 2 (two) times daily.      Marland Kitchen NIFEdipine (PROCARDIA-XL/ADALAT-CC/NIFEDICAL-XL) 30 MG 24 hr tablet Take 30 mg by mouth daily.      Marland Kitchen NITROSTAT 0.4 MG SL tablet DISSOLVE 1 TABLET UNDER TONGUE AS NEEDED FOR CHEST PAIN,MAY REPEAT IN5 MINUTES FOR 2 DOSES.  25 tablet  5  . omega-3 acid ethyl esters (LOVAZA) 1 G capsule Take 1 g by mouth 2 (two) times daily.      . potassium gluconate 595 MG TABS Take 595 mg by mouth daily.      . Tamsulosin HCl (FLOMAX) 0.4 MG CAPS Take 1 capsule (0.4 mg total) by mouth daily.  30 capsule  6  . temazepam (RESTORIL) 30 MG capsule Take 30 mg by mouth at bedtime as needed for sleep.      . valsartan (DIOVAN) 80 MG tablet Take 80 mg by mouth daily.       No current facility-administered medications for this  visit.    Allergies  Allergen Reactions  . Metformin And Related Nausea And Vomiting  . Metoprolol     Heart starts racing. Shallow breathing     Family History  Problem Relation Age of Onset  . Colon cancer Father   . Coronary artery disease Brother   . Diabetes Brother   . Cancer Brother     throat and abdominal  . Heart disease Brother   . Hyperlipidemia Brother   . Peripheral vascular disease Brother     Varicose Veins  . Cancer Mother   . Diabetes Son   . Hyperlipidemia Son     History   Social History  . Marital Status: Married    Spouse Name: N/A    Number of Children: N/A  . Years of Education: N/A   Occupational History  . Retired    Social History Main Topics  . 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 Smokeless tobacco: Never Used     Comment: quit atleast 25 yrs ago, per pt  . Alcohol Use: 1.8 oz/week    3 Shots of liquor per week     Comment: socially  . Drug Use: No  . Sexually Active: Yes   Other Topics Concern  . Not on file   Social History Narrative  .  No narrative on file    ROS ALL NEGATIVE EXCEPT THOSE NOTED IN HPI  PE  General Appearance: well developed, well nourished in no acute distress, much thinner HEENT: symmetrical face, PERRLA, good dentition  Neck: no JVD, thyromegaly, or adenopathy, trachea midline Chest: symmetric without deformity Cardiac: PMI non-displaced, RRR, normal S1, S2, no gallop or murmur Lung: clear to ausculation and percussion Vascular: all pulses full , soft carotid bruit Abdominal: nondistended, nontender, good bowel sounds, no HSM, no bruits Extremities: no cyanosis, clubbing or edema, no sign of DVT, no varicosities  Skin: normal color, no rashes Neuro: alert and oriented x 3, non-focal Pysch: normal affect  EKG Not repeated BMET    Component Value Date/Time   NA 139 03/30/2013 0455   K 3.9 03/30/2013 0455   CL 102 03/30/2013 0455   CO2 30 03/30/2013 0455   GLUCOSE 133* 03/30/2013 0455   BUN 28* 03/30/2013 0455   CREATININE 1.35 03/30/2013 0455   CALCIUM 8.7 03/30/2013 0455   GFRNONAA 47* 03/30/2013 0455   GFRAA 55* 03/30/2013 0455    Lipid Panel     Component Value Date/Time   CHOL 96 01/23/2013 1507   TRIG 140.0 01/23/2013 1507   HDL 37.70* 01/23/2013 1507   CHOLHDL 3 01/23/2013 1507   VLDL 28.0 01/23/2013 1507   LDLCALC 30 01/23/2013 1507    CBC    Component Value Date/Time   WBC 6.2 03/30/2013 0455   RBC 3.32* 03/30/2013 0455   HGB 10.6* 03/30/2013 0455   HCT 31.3* 03/30/2013 0455   PLT 122* 03/30/2013 0455   MCV 94.3 03/30/2013 0455   MCH 31.9 03/30/2013 0455   MCHC 33.9 03/30/2013 0455   RDW 12.8 03/30/2013 0455   LYMPHSABS 0.5* 03/28/2013 0700   MONOABS 0.5 03/28/2013 0700   EOSABS 0.2 03/28/2013 0700   BASOSABS 0.0 03/28/2013 0700

## 2013-05-22 NOTE — Patient Instructions (Addendum)
Your physician recommends that you continue on your current medications as directed. Please refer to the Current Medication list given to you today.  Your physician wants you to follow-up in: 1 year with Dr. [name]. You will receive a reminder letter in the mail two months in advance. If you don't receive a letter, please call our office to schedule the follow-up appointment.

## 2013-05-22 NOTE — Assessment & Plan Note (Signed)
Stable. Continue secondary preventative therapy. Return the office to see Dr. [name] in one year.

## 2013-05-22 NOTE — Assessment & Plan Note (Signed)
Stable. Repeat carotid Dopplers in one year. Continue secondary preventative therapy.

## 2013-06-10 ENCOUNTER — Encounter (INDEPENDENT_AMBULATORY_CARE_PROVIDER_SITE_OTHER): Payer: Medicare PPO

## 2013-06-10 DIAGNOSIS — R0989 Other specified symptoms and signs involving the circulatory and respiratory systems: Secondary | ICD-10-CM

## 2013-06-17 ENCOUNTER — Other Ambulatory Visit: Payer: Self-pay | Admitting: Internal Medicine

## 2013-07-10 ENCOUNTER — Other Ambulatory Visit: Payer: Self-pay | Admitting: Internal Medicine

## 2013-09-16 ENCOUNTER — Encounter: Payer: Self-pay | Admitting: Family

## 2013-09-17 ENCOUNTER — Encounter (INDEPENDENT_AMBULATORY_CARE_PROVIDER_SITE_OTHER): Payer: Medicare PPO | Admitting: *Deleted

## 2013-09-17 ENCOUNTER — Encounter: Payer: Self-pay | Admitting: Family

## 2013-09-17 ENCOUNTER — Ambulatory Visit (INDEPENDENT_AMBULATORY_CARE_PROVIDER_SITE_OTHER): Payer: Medicare PPO | Admitting: Family

## 2013-09-17 VITALS — BP 152/84 | HR 80 | Resp 16 | Ht 67.0 in | Wt 187.0 lb

## 2013-09-17 DIAGNOSIS — Z48812 Encounter for surgical aftercare following surgery on the circulatory system: Secondary | ICD-10-CM

## 2013-09-17 DIAGNOSIS — I70219 Atherosclerosis of native arteries of extremities with intermittent claudication, unspecified extremity: Secondary | ICD-10-CM

## 2013-09-17 DIAGNOSIS — I739 Peripheral vascular disease, unspecified: Secondary | ICD-10-CM

## 2013-09-17 DIAGNOSIS — I6529 Occlusion and stenosis of unspecified carotid artery: Secondary | ICD-10-CM

## 2013-09-17 NOTE — Progress Notes (Signed)
[facility] [place] HISTORY AND PHYSICAL   MRN : [id number]  History of Present Illness:   Andrew [name] is a 79 y.o. male patient of Dr. Kellie Simmering status post AAA resection left renal artery bypass graft 1997 by Dr. Amedeo Plenty. After walking a couple of blocks he has bilateral buttocks, thighs, and calves aching; this has been improving over time.  After resting for 30 seconds he can resume walking. He denies rest pain, denies non-healing wounds or ulcers. Patient states that his blood pressure is usually [id number] systolic.   Patient has Negative history of TIA or stroke symptom.  The patient denies amaurosis fugax or monocular blindness.  The patient  denies facial drooping.  Pt. denies hemiplegia.  The patient denies receptive or expressive aphasia.  Pt. denies weakness.   The patient has not had sudden back or abdominal pain.  Pt Diabetic: No, is now diet controlled Pt smoker: former smoker, quit at age 58  Current Outpatient Prescriptions  Medication Sig Dispense Refill  . ALPRAZolam (XANAX) 0.5 MG tablet Take 0.25-0.5 mg by mouth 3 (three) times daily as needed for sleep or anxiety.      Marland Kitchen aspirin 325 MG tablet Take 325 mg by mouth daily.        Marland Kitchen atorvastatin (LIPITOR) 80 MG tablet Take 1 tablet (80 mg total) by mouth daily.  90 tablet  1  . celecoxib (CELEBREX) 200 MG capsule Take 1 capsule (200 mg total) by mouth 2 (two) times daily.  180 capsule  3  . cilostazol (PLETAL) 100 MG tablet Take 1 tablet (100 mg total) by mouth 2 (two) times daily.  180 tablet  3  . clopidogrel (PLAVIX) 75 MG tablet Take 1 tablet (75 mg total) by mouth daily.  90 tablet  3  . DIOVAN 80 MG tablet TAKE 1 TABLET ONCE DAILY.  90 tablet  3  . erythromycin with ethanol (THERAMYCIN) 2 % external solution APPLY TWICE DAILY.  60 mL  11  . fenofibrate (TRICOR) 145 MG tablet Take 1 tablet (145 mg total) by mouth daily.  90 tablet  3  . FLUoxetine (PROZAC) 10 MG capsule Take 1 capsule (10 mg total)  by mouth daily.  90 capsule  3  . furosemide (LASIX) 40 MG tablet Take 40 mg by mouth daily.      Marland Kitchen glucosamine-chondroitin 500-400 MG tablet Take 1 tablet by mouth 2 (two) times daily.        . meclizine (ANTIVERT) 25 MG tablet Take 1 tablet (25 mg total) by mouth 3 (three) times daily as needed.  60 tablet  1  . Misc Natural Products (NF FORMULAS TESTOSTERONE) CAPS Take 1 capsule by mouth 2 (two) times daily.      Marland Kitchen NIFEdipine (PROCARDIA-XL/ADALAT CC) 30 MG 24 hr tablet TAKE 1 TABLET ONCE DAILY.  90 tablet  3  . NIFEdipine (PROCARDIA-XL/ADALAT-CC/NIFEDICAL-XL) 30 MG 24 hr tablet Take 30 mg by mouth daily.      Marland Kitchen NITROSTAT 0.4 MG SL tablet DISSOLVE 1 TABLET UNDER TONGUE AS NEEDED FOR CHEST PAIN,MAY REPEAT IN5 MINUTES FOR 2 DOSES.  25 tablet  5  . omega-3 acid ethyl esters (LOVAZA) 1 G capsule Take 1 g by mouth 2 (two) times daily.      . potassium gluconate 595 MG TABS Take 595 mg by mouth daily.      . Tamsulosin HCl (FLOMAX) 0.4 MG CAPS Take 1 capsule (0.4 mg total) by mouth daily.  30 capsule  6  . temazepam (RESTORIL) 30 MG capsule Take 30 mg by mouth at bedtime as needed for sleep.      Marland Kitchen ZETIA 10 MG tablet TAKE (1) TABLET DAILY.  90 tablet  3   No current facility-administered medications for this visit.    Pt meds include: Statin :Yes Betablocker: No ASA: Yes Other anticoagulants/antiplatelets: Plavix  Past Medical History  Diagnosis Date  . CAD (coronary artery disease)   . LVF (left ventricular failure)   . Carotid artery stenosis   . Hyperlipidemia   . Hypertension   . GERD (gastroesophageal reflux disease)   . IBS (irritable bowel syndrome)   . Arthritis   . Diabetes mellitus   . Thyroid disease   . Anemia   . Vitamin D deficiency   . Depression   . Fatty liver 2008  . Cancer     Melanoma - Back  . AAA (abdominal aortic aneurysm)     Past Surgical History  Procedure Laterality Date  . Total knee arthroplasty      bilateral  . Cardiac stents  2005  .  Coronary artery bypass graft  1992  . Joint replacement      Bilateral knee arthroplasty  . Eye surgery      Catarart  . Abdominal aortic aneurysm repair  2002    Social History History  Substance Use Topics  . 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 Smokeless tobacco: Never Used     Comment: quit atleast 25 yrs ago, per pt  . Alcohol Use: 1.8 oz/week    3 Shots of liquor per week     Comment: socially    Family History Family History  Problem Relation Age of Onset  . Colon cancer Father   . Coronary artery disease Brother   . Diabetes Brother   . Cancer Brother     throat and abdominal  . Heart disease Brother   . Hyperlipidemia Brother   . Peripheral vascular disease Brother     Varicose Veins  . Cancer Mother   . Diabetes Son   . Hyperlipidemia Son     Allergies  Allergen Reactions  . Metformin And Related Nausea And Vomiting  . Metoprolol     Heart starts racing. Shallow breathing      REVIEW OF SYSTEMS  General: [ ]  Weight loss, [ ]  Fever, [ ]  chills Neurologic: [ ]  Dizziness, [ ]  Blackouts, [ ]  Seizure [ ]  Stroke, [ ]  "Mini stroke", [ ]  Slurred speech, [ ]  Temporary blindness; [ ]  weakness in arms or legs, [ ]  Hoarseness [ ]  Dysphagia Cardiac: [ ]  Chest pain/pressure, [ ]  Shortness of breath at rest Valu.Nieves ] Shortness of breath with exertion, [ ]  Atrial fibrillation or irregular heartbeat  Vascular: Valu.Nieves ] Pain in legs with walking, [ ]  Pain in legs at rest, [ ]  Pain in legs at night,  [ ]  Non-healing ulcer, [ ]  Blood clot in vein/DVT,   Pulmonary: [ ]  Home oxygen, [ ]  Productive cough, [ ]  Coughing up blood, [ ]  Asthma,  [ ]  Wheezing [ ]  COPD Musculoskeletal:  Valu.Nieves ] Arthritis, hands [ ]  Low back pain, [ ]  Joint pain Hematologic: [ ]  Easy Bruising, [ ]  Anemia; [ ]  Hepatitis Gastrointestinal: [ ]  Blood in stool, [ ]  Gastroesophageal Reflux/heartburn, Urinary: [ ]  chronic Kidney disease, [ ]  on HD - [ ]  MWF or [ ]  TTHS, [ ]  Burning with urination, [ ]  Difficulty  urinating. Positive for frequent  urination. Skin: [ ]  Rashes, [ ]  Wounds Psychological: [ ]  Anxiety, [ ]  Depression  Physical Examination Filed Vitals:   09/17/13 1156  BP: 152/84  Pulse: 80  Resp: 16   Filed Weights   09/17/13 1156  Weight: 187 lb (84.823 kg)   Body mass index is 29.28 kg/(m^2).  General:  WDWN in NAD Gait: Normal HENT: WNL Eyes: Pupils equal Pulmonary: normal non-labored breathing , without Rales, rhonchi,  wheezing Cardiac: RRR, without  Murmurs, rubs or gallops; No carotid bruits Abdomen: soft, NT, no masses Skin: no rashes, ulcers noted;  no Gangrene , no cellulitis; no open wounds;   Vascular Exam/Pulses: VASCULAR EXAM  Carotid Bruits Left Right   Negative Negative                             VASCULAR EXAM: Extremities without ischemic changes  without Gangrene; without open wounds.                                                                                                          LE Pulses LEFT RIGHT       FEMORAL   palpable   palpable        POPLITEAL  not palpable   not palpable       POSTERIOR TIBIAL  not palpable   not palpable        DORSALIS PEDIS      ANTERIOR TIBIAL  palpable   palpable      Musculoskeletal: no muscle wasting or atrophy; no edema  Neurologic: A&O X 3; Appropriate Affect ;  SENSATION: normal; MOTOR FUNCTION: 5/5 Symmetric, CN 2-12 intact Speech is fluent/normal   Non-Invasive Vascular Imaging:   ABI's: right: 0.91, biphasic, left: 1.09, biphasic  ASSESSMENT:  Andrew [name] is a 79 y.o. male patient of Dr. Kellie Simmering status post AAA resection left renal artery bypass graft 1997 by Dr. Amedeo Plenty. No change in ABI's from previous study a year ago, normal. After walking a couple of blocks he has bilateral buttocks, thighs, and calves aching; this has been improving over time.  With his history of CAD, CABG, cardiac stents, AAA repair, and PAD, will all check for carotid disease when he returns in 1  year.   PLAN:  Return in 1 year for bilateral carotid Duplex, aorto-iliac Duplex, and LE Duplex, bilateral. I discussed in depth with the patient the nature of atherosclerosis, and emphasized the importance of maximal medical management including strict control of blood pressure, blood glucose, and lipid levels, obtaining regular exercise, and continued cessation of smoking.  The patient is aware that without maximal medical management the underlying atherosclerotic disease process will progress, limiting the benefit of any interventions. The patient was given information about stroke prevention and what symptoms should prompt the patient to seek immediate medical care. The patient was given information about AAA including signs, symptoms, treatment,  what symptoms should prompt the patient to seek immediate medical care, and how to minimize the risk of enlargement and  rupture of aneurysms. The patient was given information about PAD including signs, symptoms, treatment, what symptoms should prompt the patient to seek immediate medical care, and risk reduction measures to take.  Clemon Chambers, RN, MSN, FNP-C [facility] Office: [phone]  Clinic MD: Kellie Simmering 09/17/2013 10:53 AM

## 2013-09-17 NOTE — Patient Instructions (Signed)
Abdominal Aortic Aneurysm  An aneurysm is the enlargement (dilatation), bulging, or ballooning out of part of the wall of a vein or artery. An aortic aneurysm is a bulging in the largest artery of the body. This artery supplies blood from the heart to the rest of the body.  The first part of the aorta is called the thoracic aorta. It leaves the heart, rises (ascends), arches, and goes down (descends) through the chest until it reaches the diaphragm. The diaphragm is the muscular part between the chest and abdomen.  The second part of the aorta is called the abdominal aorta after it has passed the diaphragm and continues down through the abdomen. The abdominal aorta ends where it splits to form the two iliac arteries that go to the legs. Aortic aneurysms can develop anywhere along the length of the aorta. The majority are located along the abdominal aorta. The major concern with an aortic aneurysm is that it can enlarge and rupture. This can cause death unless diagnosed and treated promptly. Aneurysms can also develop blood clots or infections. CAUSES  Many aortic aneurysms are caused by arteriosclerosis. Arteriosclerosis can weaken the aortic wall. The pressure of the blood being pumped through the aorta causes it to balloon out at the site of weakness. Therefore, high blood pressure (hypertension) is associated with aneurysm. Other risk factors include:  Age over 31.  Tobacco use.  Being male.    White race.  Family history of aneurysm.  Less frequent causes of abdominal aortic aneurysms include:  Connective tissue diseases.  Abdominal trauma.  Inflammation of blood vessles (arteritis).  Inherited (congenital) malformations.  Infection. SYMPTOMS  The signs and symptoms of an unruptured aneurysm will partly depend on its size and rate of growth.   Abdominal aortic aneurysms may cause pain. The pain typically has a deep quality as if it is piercing into the person. It is felt most  often in the lower back area. The pain is usually steady but may be relieved by changing your body position.  The person may also become aware of an abnormally prominent pulse in the belly (abdominal pulsation). DIAGNOSIS  An aortic aneurysm may be discovered by chance on physical exam, or on X-ray studies done for other reasons. It may be suspected because of other problems such as back or abdominal pain. The following tests may help identify the problem.  X-rays of the abdomen can show calcium deposits in the aneurysm wall.  CT scanning of the abdomen, particularly with contrast medium, is accurate at showing the exact size and shape of the aneurysm.  Ultrasounds give a clear picture of the size of an aneurysm (about 98% accuracy).  MRI scanning is accurate, but often unnecessary.  An abdominal angiogram shows the source of the major blood vessels arising from the aorta. It reveals the size and extent of any aneurysm. It can also show a clot clinging to the wall of the aneurysm (mural thrombus). TREATMENT  Treating an abdominal aortic aneurysm depends on the size. A rupture of an aneurysm is uncommon when they are less than 5 cm wide (2 inches). Rupture is far more common in aneurysms that are over 6 cm wide (2.4 inches).  Surgical repair is usually recommended for all aneurysms over 6 cm wide (2.4 inches). This depends on the health, age, and other circumstances of the individual. This type of surgery consists of opening the abdomen, removing the aneurysm, and sewing a synthetic graft (similar to a cloth tube) in its  place. A less invasive form of this surgery, using stent grafts, is sometimes recommended.  For most patients, elective repair is recommended for aneurysms between 4 and 6 cm (1.6 and 2.4 inches). Elective means the surgery can be done at your convenience. This should not be put off too long if surgery is recommended.  If you smoke, stop immediately. Smoking is a major risk  factor for enlargement and rupture.  Medications may be used to help decrease complications  these include medicine to lower blood pressure and control cholesterol. HOME CARE INSTRUCTIONS   If you smoke, stop. Do not start smoking.  Take all medications as prescribed.  Your caregiver will tell you when to have your aneurysm rechecked, either by ultrasound or CT scan.  If your caregiver has given you a follow-up appointment, it is very important to keep that appointment. Not keeping the appointment could result in a chronic or permanent injury, pain, or disability. If there is any problem keeping the appointment, you must call back to this facility for assistance. SEEK MEDICAL CARE IF:   You develop mild abdominal pain or pressure.  You are able to feel or perceive your aneurysm, and you sense any change. SEEK IMMEDIATE MEDICAL CARE IF:   You develop severe abdominal pain, or severe pain moving (radiating) to your back.  You suddenly develop cold or blue toes or feet.  You suddenly develop lightheadedness or fainting spells. MAKE SURE YOU:   Understand these instructions.  Will watch your condition.  Will get help right away if you are not doing well or get worse. Document Released: 09/21/2005 Document Revised: 03/05/2012 Document Reviewed: 07/15/2008 [facility] Patient Information 2014 [place].   Peripheral Vascular Disease Peripheral Vascular Disease (PVD), also called Peripheral Arterial Disease (PAD), is a circulation problem caused by cholesterol (atherosclerotic plaque) deposits in the arteries. PVD commonly occurs in the lower extremities (legs) but it can occur in other areas of the body, such as your arms. The cholesterol buildup in the arteries reduces blood flow which can cause pain and other serious problems. The presence of PVD can place a person at risk for Coronary Artery Disease (CAD).  CAUSES  Causes of PVD can be many. It is usually associated with more  than one risk factor such as:   High Cholesterol.  Smoking.  Diabetes.  Lack of exercise or inactivity.  High blood pressure (hypertension).  Obesity.  Family history. SYMPTOMS   When the lower extremities are affected, patients with PVD may experience:  Leg pain with exertion or physical activity. This is called INTERMITTENT CLAUDICATION. This may present as cramping or numbness with physical activity. The location of the pain is associated with the level of blockage. For example, blockage at the abdominal level (distal abdominal aorta) may result in buttock or hip pain. Lower leg arterial blockage may result in calf pain.  As PVD becomes more severe, pain can develop with less physical activity.  In people with severe PVD, leg pain may occur at rest.  Other PVD signs and symptoms:  Leg numbness or weakness.  Coldness in the affected leg or foot, especially when compared to the other leg.  A change in leg color.  Patients with significant PVD are more prone to ulcers or sores on toes, feet or legs. These may take longer to heal or may reoccur. The ulcers or sores can become infected.  If signs and symptoms of PVD are ignored, gangrene may occur. This can result in the loss of  toes or loss of an entire limb.  Not all leg pain is related to PVD. Other medical conditions can cause leg pain such as:  Blood clots (embolism) or Deep Vein Thrombosis.  Inflammation of the blood vessels (vasculitis).  Spinal stenosis. DIAGNOSIS  Diagnosis of PVD can involve several different types of tests. These can include:  Pulse Volume Recording Method (PVR). This test is simple, painless and does not involve the use of X-rays. PVR involves measuring and comparing the blood pressure in the arms and legs. An ABI (Ankle-Brachial Index) is calculated. The normal ratio of blood pressures is 1. As this number becomes smaller, it indicates more severe disease.  < 0.95  indicates significant  narrowing in one or more leg vessels.  <0.8 there will usually be pain in the foot, leg or buttock with exercise.  <0.4 will usually have pain in the legs at rest.  <0.25  usually indicates limb threatening PVD.  Doppler detection of pulses in the legs. This test is painless and checks to see if you have a pulses in your legs/feet.  A dye or contrast material (a substance that highlights the blood vessels so they show up on x-ray) may be given to help your caregiver better see the arteries for the following tests. The dye is eliminated from your body by the kidney's. Your caregiver may order blood work to check your kidney function and other laboratory values before the following tests are performed:  Magnetic Resonance Angiography (MRA). An MRA is a picture study of the blood vessels and arteries. The MRA machine uses a large magnet to produce images of the blood vessels.  Computed Tomography Angiography (CTA). A CTA is a specialized x-ray that looks at how the blood flows in your blood vessels. An IV may be inserted into your arm so contrast dye can be injected.  Angiogram. Is a procedure that uses x-rays to look at your blood vessels. This procedure is minimally invasive, meaning a small incision (cut) is made in your groin. A small tube (catheter) is then inserted into the artery of your groin. The catheter is guided to the blood vessel or artery your caregiver wants to examine. Contrast dye is injected into the catheter. X-rays are then taken of the blood vessel or artery. After the images are obtained, the catheter is taken out. TREATMENT  Treatment of PVD involves many interventions which may include:  Lifestyle changes:  Quitting smoking.  Exercise.  Following a low fat, low cholesterol diet.  Control of diabetes.  Foot care is very important to the PVD patient. Good foot care can help prevent infection.  Medication:  Cholesterol-lowering medicine.  Blood pressure  medicine.  Anti-platelet drugs.  Certain medicines may reduce symptoms of Intermittent Claudication.  Interventional/Surgical options:  Angioplasty. An Angioplasty is a procedure that inflates a balloon in the blocked artery. This opens the blocked artery to improve blood flow.  Stent Implant. A wire mesh tube (stent) is placed in the artery. The stent expands and stays in place, allowing the artery to remain open.  Peripheral Bypass Surgery. This is a surgical procedure that reroutes the blood around a blocked artery to help improve blood flow. This type of procedure may be performed if Angioplasty or stent implants are not an option. SEEK IMMEDIATE MEDICAL CARE IF:   You develop pain or numbness in your arms or legs.  Your arm or leg turns cold, becomes blue in color.  You develop redness, warmth, swelling and pain in  your arms or legs. MAKE SURE YOU:   Understand these instructions.  Will watch your condition.  Will get help right away if you are not doing well or get worse. Document Released: 01/19/2005 Document Revised: 03/05/2012 Document Reviewed: 12/16/2008 [facility] Patient Information 2014 [place], [place].  Stroke Prevention Some medical conditions and behaviors are associated with an increased chance of having a stroke. You may prevent a stroke by making healthy choices and managing medical conditions. Reduce your risk of having a stroke by:  Staying physically active. Get at least 30 minutes of activity on most or all days.  Not smoking. It may also be helpful to avoid exposure to secondhand smoke.  Limiting alcohol use. Moderate alcohol use is considered to be:  No more than 2 drinks per day for men.  No more than 1 drink per day for nonpregnant women.  Eating healthy foods.  Include 5 or more servings of fruits and vegetables a day.  Certain diets may be prescribed to address high blood pressure, high cholesterol, diabetes, or obesity.  Managing your  cholesterol levels.  A low-saturated fat, low-trans fat, low-cholesterol, and high-fiber diet may control cholesterol levels.  Take any prescribed medicines to control cholesterol as directed by your caregiver.  Managing your diabetes.  A controlled-carbohydrate, controlled-sugar diet is recommended to manage diabetes.  Take any prescribed medicines to control diabetes as directed by your caregiver.  Controlling your high blood pressure (hypertension).  A low-salt (sodium), low-saturated fat, low-trans fat, and low-cholesterol diet is recommended to manage high blood pressure.  Take any prescribed medicines to control hypertension as directed by your caregiver.  Maintaining a healthy weight.  A reduced-calorie, low-sodium, low-saturated fat, low-trans fat, low-cholesterol diet is recommended to manage weight.  Stopping drug abuse.  Avoiding birth control pills.  Talk to your caregiver about the risks of taking birth control pills if you are over 19 years old, smoke, get migraines, or have ever had a blood clot.  Getting evaluated for sleep disorders (sleep apnea).  Talk to your caregiver about getting a sleep evaluation if you snore a lot or have excessive sleepiness.  Taking medicines as directed by your caregiver.  For some people, aspirin or blood thinners (anticoagulants) are helpful in reducing the risk of forming abnormal blood clots that can lead to stroke. If you have the irregular heart rhythm of atrial fibrillation, you should be on a blood thinner unless there is a good reason you cannot take them.  Understand all your medicine instructions. SEEK IMMEDIATE MEDICAL CARE IF:   You have sudden weakness or numbness of the face, arm, or leg, especially on one side of the body.  You have sudden confusion.  You have trouble speaking (aphasia) or understanding.  You have sudden trouble seeing in one or both eyes.  You have sudden trouble walking.  You have  dizziness.  You have a loss of balance or coordination.  You have a sudden, severe headache with no known cause.  You have new chest pain or an irregular heartbeat. Any of these symptoms may represent a serious problem that is an emergency. Do not wait to see if the symptoms will go away. Get medical help right away. Call your local emergency services (911 in [place] Do not drive yourself to the hospital. Document Released: 01/19/2005 Document Revised: 03/05/2012 Document Reviewed: 08/01/2011 [facility] Patient Information 2014 [place], [place].

## 2013-09-18 NOTE — Addendum Note (Signed)
Addended by: Mena Goes on: 09/18/2013 11:25 AM   Modules accepted: Orders

## 2013-10-04 ENCOUNTER — Other Ambulatory Visit: Payer: Self-pay | Admitting: Internal Medicine

## 2013-10-21 ENCOUNTER — Other Ambulatory Visit: Payer: Self-pay

## 2013-10-21 MED ORDER — TAMSULOSIN HCL 0.4 MG PO CAPS: 0.4000 mg | ORAL_CAPSULE | Freq: Every day | ORAL | Status: DC

## 2013-10-22 ENCOUNTER — Other Ambulatory Visit: Payer: Self-pay

## 2013-10-22 ENCOUNTER — Telehealth: Payer: Self-pay

## 2013-10-22 NOTE — Telephone Encounter (Signed)
Phoned in medication to [facility].

## 2013-10-22 NOTE — Telephone Encounter (Signed)
Fax came from pharmacy requesting a refill for Xanax 0.5mg  with 90 tabs. The patient was last seen on 05/14/2013, on the medication list the medicine is listed as being prescribed by a historical provider.   Please advise,   Thanks!

## 2013-10-22 NOTE — Telephone Encounter (Signed)
ok 

## 2013-10-23 ENCOUNTER — Ambulatory Visit (INDEPENDENT_AMBULATORY_CARE_PROVIDER_SITE_OTHER): Payer: Medicare PPO | Admitting: Podiatrist

## 2013-10-23 ENCOUNTER — Encounter: Payer: Self-pay | Admitting: Podiatrist

## 2013-10-23 VITALS — BP 134/86 | HR 65 | Resp 12 | Ht 67.0 in | Wt 185.0 lb

## 2013-10-23 DIAGNOSIS — M79609 Pain in unspecified limb: Secondary | ICD-10-CM

## 2013-10-23 DIAGNOSIS — B351 Tinea unguium: Secondary | ICD-10-CM

## 2013-10-23 NOTE — Progress Notes (Signed)
HPI:  Patient presents today for follow up of foot and nail care. Denies any new complaints today. Presents today with his daughter Helene Kelp. Patient is seen in his wheelchair today as well.  Objective:  Patients chart is reviewed.  Neurovascular status unchanged with faintly palpable pedal pulses. Rubor upon dependency also noted. Capillary refill time is immediate..  Patients nails are thickened, discolored, distrophic, friable and brittle with yellow-brown discoloration. Patient subjectively relates they are painful bilateral  Assessment:  Symptomatic onychomycosis  Plan:  Discussed treatment options and alternatives.  The symptomatic toenails were debrided through manual an mechanical means without complication.  Return appointment recommended at routine intervals of 3 months

## 2013-11-29 ENCOUNTER — Telehealth: Payer: Self-pay

## 2013-11-29 NOTE — Telephone Encounter (Signed)
He is due for a visit with me I can make these changes during a f/up visit

## 2013-11-29 NOTE — Telephone Encounter (Signed)
LM w/wife for him to call for an appt.

## 2013-11-29 NOTE — Telephone Encounter (Signed)
Received fax from pharmacy stating pt is trying to lower medication cost for 2015.He is requesting a cheaper alternative  to diovan. Pharmacy would like to know if there is a generic ARB he could try such as losartan. Please advise and  Rx is need for new medication.

## 2013-11-29 NOTE — Telephone Encounter (Signed)
Appt on Dec 8.

## 2013-11-29 NOTE — Telephone Encounter (Signed)
Pharmacy also asking if generic mobic is an option to replace celebrex. Thanks

## 2013-12-02 ENCOUNTER — Ambulatory Visit (INDEPENDENT_AMBULATORY_CARE_PROVIDER_SITE_OTHER): Payer: Medicare PPO | Admitting: Internal Medicine

## 2013-12-02 ENCOUNTER — Encounter: Payer: Self-pay | Admitting: Internal Medicine

## 2013-12-02 ENCOUNTER — Other Ambulatory Visit (INDEPENDENT_AMBULATORY_CARE_PROVIDER_SITE_OTHER): Payer: Medicare PPO

## 2013-12-02 VITALS — BP 120/70 | HR 66 | Temp 98.1°F | Resp 16 | Ht 67.0 in | Wt 193.0 lb

## 2013-12-02 DIAGNOSIS — D696 Thrombocytopenia, unspecified: Secondary | ICD-10-CM

## 2013-12-02 DIAGNOSIS — E1165 Type 2 diabetes mellitus with hyperglycemia: Secondary | ICD-10-CM

## 2013-12-02 DIAGNOSIS — IMO0002 Reserved for concepts with insufficient information to code with codable children: Secondary | ICD-10-CM

## 2013-12-02 DIAGNOSIS — I10 Essential (primary) hypertension: Secondary | ICD-10-CM

## 2013-12-02 DIAGNOSIS — N182 Chronic kidney disease, stage 2 (mild): Secondary | ICD-10-CM

## 2013-12-02 DIAGNOSIS — N2889 Other specified disorders of kidney and ureter: Secondary | ICD-10-CM

## 2013-12-02 DIAGNOSIS — M1710 Unilateral primary osteoarthritis, unspecified knee: Secondary | ICD-10-CM

## 2013-12-02 LAB — CBC WITH DIFFERENTIAL/PLATELET
Basophils Absolute: 0 10*3/uL (ref 0.0–0.1)
Eosinophils Absolute: 0.1 10*3/uL (ref 0.0–0.7)
Hemoglobin: 11.8 g/dL — ABNORMAL LOW (ref 13.0–17.0)
Lymphocytes Relative: 23 % (ref 12.0–46.0)
MCHC: 33.7 g/dL (ref 30.0–36.0)
Monocytes Relative: 9.5 % (ref 3.0–12.0)
Neutrophils Relative %: 64.3 % (ref 43.0–77.0)
Platelets: 163 10*3/uL (ref 150.0–400.0)
RDW: 13.2 % (ref 11.5–14.6)

## 2013-12-02 LAB — BASIC METABOLIC PANEL
CO2: 28 mEq/L (ref 19–32)
Calcium: 9.4 mg/dL (ref 8.4–10.5)
Creatinine, Ser: 1.4 mg/dL (ref 0.4–1.5)
GFR: 52.72 mL/min — ABNORMAL LOW (ref >60.00)
Glucose, Bld: 90 mg/dL (ref 70–99)
Sodium: 140 mEq/L (ref 135–145)

## 2013-12-02 LAB — HEMOGLOBIN A1C: Hgb A1c MFr Bld: 6.4 % (ref 4.6–6.5)

## 2013-12-02 MED ORDER — LOSARTAN POTASSIUM 100 MG PO TABS: 100.0000 mg | ORAL_TABLET | Freq: Every day | ORAL | Status: DC

## 2013-12-02 MED ORDER — MELOXICAM 7.5 MG PO TABS: 7.5000 mg | ORAL_TABLET | Freq: Every day | ORAL | Status: DC

## 2013-12-02 NOTE — Progress Notes (Signed)
Subjective:    Patient ID: Andrew [name], male    DOB: May 19, 1928, 79 y.o.   MRN: [id number]  Hypertension This is a chronic problem. The current episode started more than 1 year ago. The problem is unchanged. The problem is controlled. Pertinent negatives include no anxiety, blurred vision, chest pain, headaches, malaise/fatigue, neck pain, orthopnea, palpitations, peripheral edema, PND, shortness of breath or sweats. Agents associated with hypertension include NSAIDs. Risk factors for coronary artery disease include obesity. Past treatments include calcium channel blockers, angiotensin blockers and diuretics. The current treatment provides moderate improvement. Compliance problems include medication cost, exercise and diet.  Hypertensive end-organ damage includes PVD.      Review of Systems  Constitutional: Negative.  Negative for fever, chills, malaise/fatigue, diaphoresis, appetite change and fatigue.  HENT: Negative.   Eyes: Negative.  Negative for blurred vision.  Respiratory: Negative.  Negative for cough, choking, chest tightness, shortness of breath, wheezing and stridor.   Cardiovascular: Negative.  Negative for chest pain, palpitations, orthopnea, leg swelling and PND.  Gastrointestinal: Negative.  Negative for nausea, vomiting, abdominal pain, diarrhea, constipation and blood in stool.  Endocrine: Negative.   Genitourinary: Negative.   Musculoskeletal: Positive for arthralgias (both knees). Negative for back pain, gait problem, joint swelling, myalgias, neck pain and neck stiffness.  Skin: Negative.   Allergic/Immunologic: Negative.   Neurological: Negative.  Negative for dizziness, tremors, seizures, syncope, facial asymmetry, speech difficulty, light-headedness, numbness and headaches.  Hematological: Negative.  Negative for adenopathy. Does not bruise/bleed easily.  Psychiatric/Behavioral: Negative.        Objective:   Physical Exam  Vitals reviewed. Constitutional:  He is oriented to person, place, and time. He appears well-developed and well-nourished. No distress.  HENT:  Head: Normocephalic and atraumatic.  Mouth/Throat: Oropharynx is clear and moist. No oropharyngeal exudate.  Eyes: Conjunctivae are normal. Right eye exhibits no discharge. Left eye exhibits no discharge. No scleral icterus.  Neck: Normal range of motion. Neck supple. No JVD present. No tracheal deviation present. No thyromegaly present.  Cardiovascular: Normal rate, regular rhythm, normal heart sounds and intact distal pulses.  Exam reveals no gallop and no friction rub.   No murmur heard. Pulmonary/Chest: Effort normal and breath sounds normal. No stridor. No respiratory distress. He has no wheezes. He has no rales. He exhibits no tenderness.  Abdominal: Soft. Bowel sounds are normal. He exhibits no distension and no mass. There is no tenderness. There is no rebound and no guarding.  Musculoskeletal: Normal range of motion. He exhibits no tenderness. Edema: trace pitting edema in BLE.  Lymphadenopathy:    He has no cervical adenopathy.  Neurological: He is oriented to person, place, and time.  Skin: Skin is warm and dry. No rash noted. He is not diaphoretic. No erythema. No pallor.  Psychiatric: He has a normal mood and affect. His behavior is normal. Judgment and thought content normal.     Lab Results  Component Value Date   WBC 6.2 03/30/2013   HGB 10.6* 03/30/2013   HCT 31.3* 03/30/2013   PLT 122* 03/30/2013   GLUCOSE 133* 03/30/2013   CHOL 96 01/23/2013   TRIG 140.0 01/23/2013   HDL 37.70* 01/23/2013   LDLDIRECT 78.6 03/18/2010   LDLCALC 30 01/23/2013   ALT 17 03/29/2013   AST 24 03/29/2013   NA 139 03/30/2013   K 3.9 03/30/2013   CL 102 03/30/2013   CREATININE 1.35 03/30/2013   BUN 28* 03/30/2013   CO2 30 03/30/2013   TSH 1.416  03/28/2013   PSA 2.79 01/23/2013   INR 1.10 03/28/2013   HGBA1C 6.8* 02/14/2013   MICROALBUR 45.5* 03/18/2010       Assessment & Plan:

## 2013-12-02 NOTE — Assessment & Plan Note (Signed)
I will recheck his renal function today and will address if it is declining

## 2013-12-02 NOTE — Assessment & Plan Note (Signed)
His BP is well controlled Diovan was changed to losartan for cost considerations I will check his lytes and renal function today

## 2013-12-02 NOTE — Assessment & Plan Note (Signed)
I have changed celebrex to mobic for his cost considerations

## 2013-12-02 NOTE — Progress Notes (Signed)
Pre visit review using our clinic review tool, if applicable. No additional management support is needed unless otherwise documented below in the visit note. 

## 2013-12-02 NOTE — Assessment & Plan Note (Signed)
I think this is related to meds and EtOH I will recheck his level today and if it is not stable will evaluate further

## 2013-12-02 NOTE — Assessment & Plan Note (Signed)
I will recheck his A1C and will treat if it is above 7.5

## 2013-12-02 NOTE — Patient Instructions (Signed)
Type 2 Diabetes Mellitus, Adult Type 2 diabetes mellitus, often simply referred to as type 2 diabetes, is a long-lasting (chronic) disease. In type 2 diabetes, the pancreas does not make enough insulin (a hormone), the cells are less responsive to the insulin that is made (insulin resistance), or both. Normally, insulin moves sugars from food into the tissue cells. The tissue cells use the sugars for energy. The lack of insulin or the lack of normal response to insulin causes excess sugars to build up in the blood instead of going into the tissue cells. As a result, high blood sugar (hyperglycemia) develops. The effect of high sugar (glucose) levels can cause many complications. Type 2 diabetes was also previously called adult-onset diabetes but it can occur at any age.  RISK FACTORS  A person is predisposed to developing type 2 diabetes if someone in the family has the disease and also has one or more of the following primary risk factors:  Overweight.  An inactive lifestyle.  A history of consistently eating high-calorie foods. Maintaining a normal weight and regular physical activity can reduce the chance of developing type 2 diabetes. SYMPTOMS  A person with type 2 diabetes may not show symptoms initially. The symptoms of type 2 diabetes appear slowly. The symptoms include:  Increased thirst (polydipsia).  Increased urination (polyuria).  Increased urination during the night (nocturia).  Weight loss. This weight loss may be rapid.  Frequent, recurring infections.  Tiredness (fatigue).  Weakness.  Vision changes, such as blurred vision.  Fruity smell to your breath.  Abdominal pain.  Nausea or vomiting.  Cuts or bruises which are slow to heal.  Tingling or numbness in the hands or feet. DIAGNOSIS Type 2 diabetes is frequently not diagnosed until complications of diabetes are present. Type 2 diabetes is diagnosed when symptoms or complications are present and when blood  glucose levels are increased. Your blood glucose level may be checked by one or more of the following blood tests:  A fasting blood glucose test. You will not be allowed to eat for at least 8 hours before a blood sample is taken.  A random blood glucose test. Your blood glucose is checked at any time of the day regardless of when you ate.  A hemoglobin A1c blood glucose test. A hemoglobin A1c test provides information about blood glucose control over the previous 3 months.  An oral glucose tolerance test (OGTT). Your blood glucose is measured after you have not eaten (fasted) for 2 hours and then after you drink a glucose-containing beverage. TREATMENT   You may need to take insulin or diabetes medicine daily to keep blood glucose levels in the desired range.  You will need to match insulin dosing with exercise and healthy food choices. The treatment goal is to maintain the before meal blood sugar (preprandial glucose) level at 70 130 mg/dL. HOME CARE INSTRUCTIONS   Have your hemoglobin A1c level checked twice a year.  Perform daily blood glucose monitoring as directed by your caregiver.  Monitor urine ketones when you are ill and as directed by your caregiver.  Take your diabetes medicine or insulin as directed by your caregiver to maintain your blood glucose levels in the desired range.  Never run out of diabetes medicine or insulin. It is needed every day.  Adjust insulin based on your intake of carbohydrates. Carbohydrates can raise blood glucose levels but need to be included in your diet. Carbohydrates provide vitamins, minerals, and fiber which are an essential part of   a healthy diet. Carbohydrates are found in fruits, vegetables, whole grains, dairy products, legumes, and foods containing added sugars.    Eat healthy foods. Alternate 3 meals with 3 snacks.  Lose weight if overweight.  Carry a medical alert card or wear your medical alert jewelry.  Carry a 15 gram  carbohydrate snack with you at all times to treat low blood glucose (hypoglycemia). Some examples of 15 gram carbohydrate snacks include:  Glucose tablets, 3 or 4   Glucose gel, 15 gram tube  Raisins, 2 tablespoons (24 grams)  Jelly beans, 6  Animal crackers, 8  Regular pop, 4 ounces (120 mL)  Gummy treats, 9  Recognize hypoglycemia. Hypoglycemia occurs with blood glucose levels of 70 mg/dL and below. The risk for hypoglycemia increases when fasting or skipping meals, during or after intense exercise, and during sleep. Hypoglycemia symptoms can include:  Tremors or shakes.  Decreased ability to concentrate.  Sweating.  Increased heart rate.  Headache.  Dry mouth.  Hunger.  Irritability.  Anxiety.  Restless sleep.  Altered speech or coordination.  Confusion.  Treat hypoglycemia promptly. If you are alert and able to safely swallow, follow the 15:15 rule:  Take 15 20 grams of rapid-acting glucose or carbohydrate. Rapid-acting options include glucose gel, glucose tablets, or 4 ounces (120 mL) of fruit juice, regular soda, or low fat milk.  Check your blood glucose level 15 minutes after taking the glucose.  Take 15 20 grams more of glucose if the repeat blood glucose level is still 70 mg/dL or below.  Eat a meal or snack within 1 hour once blood glucose levels return to normal.    Be alert to polyuria and polydipsia which are early signs of hyperglycemia. An early awareness of hyperglycemia allows for prompt treatment. Treat hyperglycemia as directed by your caregiver.  Engage in at least 150 minutes of moderate-intensity physical activity a week, spread over at least 3 days of the week or as directed by your caregiver. In addition, you should engage in resistance exercise at least 2 times a week or as directed by your caregiver.  Adjust your medicine and food intake as needed if you start a new exercise or sport.  Follow your sick day plan at any time you  are unable to eat or drink as usual.  Avoid tobacco use.  Limit alcohol intake to no more than 1 drink per day for nonpregnant women and 2 drinks per day for men. You should drink alcohol only when you are also eating food. Talk with your caregiver whether alcohol is safe for you. Tell your caregiver if you drink alcohol several times a week.  Follow up with your caregiver regularly.  Schedule an eye exam soon after the diagnosis of type 2 diabetes and then annually.  Perform daily skin and foot care. Examine your skin and feet daily for cuts, bruises, redness, nail problems, bleeding, blisters, or sores. A foot exam by a caregiver should be done annually.  Brush your teeth and gums at least twice a day and floss at least once a day. Follow up with your dentist regularly.  Share your diabetes management plan with your workplace or school.  Stay up-to-date with immunizations.  Learn to manage stress.  Obtain ongoing diabetes education and support as needed.  Participate in, or seek rehabilitation as needed to maintain or improve independence and quality of life. Request a physical or occupational therapy referral if you are having foot or hand numbness or difficulties with grooming,   dressing, eating, or physical activity. SEEK MEDICAL CARE IF:   You are unable to eat food or drink fluids for more than 6 hours.  You have nausea and vomiting for more than 6 hours.  Your blood glucose level is over 240 mg/dL.  There is a change in mental status.  You develop an additional serious illness.  You have diarrhea for more than 6 hours.  You have been sick or have had a fever for a couple of days and are not getting better.  You have pain during any physical activity.  SEEK IMMEDIATE MEDICAL CARE IF:  You have difficulty breathing.  You have moderate to large ketone levels. MAKE SURE YOU:  Understand these instructions.  Will watch your condition.  Will get help right away if  you are not doing well or get worse. Document Released: 12/12/2005 Document Revised: 09/05/2012 Document Reviewed: 07/10/2012 Exit[employer] Patient Information 2014 [employer].  

## 2013-12-10 ENCOUNTER — Telehealth: Payer: Self-pay

## 2013-12-10 MED ORDER — ALPRAZOLAM 0.5 MG PO TABS: 0.2500 mg | ORAL_TABLET | Freq: Three times a day (TID) | ORAL | Status: DC | PRN

## 2013-12-10 NOTE — Telephone Encounter (Signed)
Yes, see Rx

## 2013-12-10 NOTE — Telephone Encounter (Signed)
Please advise if ok to fill alprazolam 0.5 mg TID #90 for this patient. Thanks

## 2014-01-02 ENCOUNTER — Encounter: Payer: Self-pay | Admitting: Podiatrist

## 2014-01-02 ENCOUNTER — Ambulatory Visit (INDEPENDENT_AMBULATORY_CARE_PROVIDER_SITE_OTHER): Payer: Medicare PPO | Admitting: Podiatrist

## 2014-01-02 VITALS — BP 179/80 | HR 99 | Resp 20

## 2014-01-02 DIAGNOSIS — I739 Peripheral vascular disease, unspecified: Secondary | ICD-10-CM

## 2014-01-02 DIAGNOSIS — M79609 Pain in unspecified limb: Secondary | ICD-10-CM

## 2014-01-02 DIAGNOSIS — B351 Tinea unguium: Secondary | ICD-10-CM

## 2014-01-02 NOTE — Progress Notes (Signed)
HPI:  Patient presents today for follow up of foot and nail care. Denies any new complaints today.  Objective:  Patients chart is reviewed.  Neurovascular status unchanged.  Patients nails are thickened, discolored, distrophic, friable and brittle with yellow-brown discoloration. Patient subjectively relates they are painful with shoes and with ambulation of bilateral feet.  Assessment:  Symptomatic onychomycosis  Plan:  Discussed treatment options and alternatives.  The symptomatic toenails were debrided through manual an mechanical means without complication.  Return appointment recommended at routine intervals of 3 months    Nikeia Henkes, DPM   

## 2014-02-18 ENCOUNTER — Other Ambulatory Visit: Payer: Self-pay | Admitting: Family

## 2014-02-18 DIAGNOSIS — I739 Peripheral vascular disease, unspecified: Secondary | ICD-10-CM

## 2014-02-18 DIAGNOSIS — I6529 Occlusion and stenosis of unspecified carotid artery: Secondary | ICD-10-CM

## 2014-03-07 ENCOUNTER — Encounter: Payer: Self-pay | Admitting: Cardiology

## 2014-04-01 ENCOUNTER — Other Ambulatory Visit: Payer: Self-pay | Admitting: Internal Medicine

## 2014-04-02 ENCOUNTER — Other Ambulatory Visit (HOSPITAL_COMMUNITY): Payer: Self-pay | Admitting: *Deleted

## 2014-04-02 DIAGNOSIS — I6529 Occlusion and stenosis of unspecified carotid artery: Secondary | ICD-10-CM

## 2014-04-03 ENCOUNTER — Ambulatory Visit (INDEPENDENT_AMBULATORY_CARE_PROVIDER_SITE_OTHER): Payer: Medicare PPO | Admitting: Podiatrist

## 2014-04-03 ENCOUNTER — Encounter: Payer: Self-pay | Admitting: Podiatrist

## 2014-04-03 VITALS — BP 116/49 | HR 92 | Resp 18 | Ht 67.0 in | Wt 185.0 lb

## 2014-04-03 DIAGNOSIS — B351 Tinea unguium: Secondary | ICD-10-CM

## 2014-04-03 DIAGNOSIS — M79609 Pain in unspecified limb: Secondary | ICD-10-CM

## 2014-04-03 NOTE — Progress Notes (Signed)
HPI:  Patient presents today for follow up of foot and nail care. Denies any new complaints today.  Objective:  Patients chart is reviewed.  Vascular status reveals pedal pulses noted at 2 out of 4 dp and pt bilateral .  Neurological sensation is Normal to Semmes Weinstein monofilament bilateral.  Patients nails are thickened, discolored, distrophic, friable and brittle with yellow-brown discoloration. Patient subjectively relates they are painful with shoes and with ambulation of bilateral feet.  Assessment:  Symptomatic onychomycosis  Plan:  Discussed treatment options and alternatives.  The symptomatic toenails were debrided through manual an mechanical means without complication.  Return appointment recommended at routine intervals of 3 months    Andrew [name], DPM   

## 2014-04-10 ENCOUNTER — Ambulatory Visit (HOSPITAL_COMMUNITY): Payer: Medicare PPO | Attending: Cardiology | Admitting: Cardiology

## 2014-04-10 ENCOUNTER — Encounter: Payer: Self-pay | Admitting: Cardiology

## 2014-04-10 DIAGNOSIS — I6529 Occlusion and stenosis of unspecified carotid artery: Secondary | ICD-10-CM

## 2014-04-10 NOTE — Progress Notes (Signed)
Carotid duplex performed 

## 2014-04-18 ENCOUNTER — Other Ambulatory Visit: Payer: Self-pay | Admitting: Internal Medicine

## 2014-04-28 ENCOUNTER — Other Ambulatory Visit: Payer: Self-pay | Admitting: Internal Medicine

## 2014-05-22 ENCOUNTER — Encounter: Payer: Self-pay | Admitting: *Deleted

## 2014-05-22 ENCOUNTER — Encounter: Payer: Self-pay | Admitting: Cardiology

## 2014-05-22 ENCOUNTER — Ambulatory Visit (INDEPENDENT_AMBULATORY_CARE_PROVIDER_SITE_OTHER): Payer: Medicare PPO | Admitting: Cardiology

## 2014-05-22 VITALS — BP 128/64 | HR 88 | Ht 67.0 in | Wt 188.0 lb

## 2014-05-22 DIAGNOSIS — R0602 Shortness of breath: Secondary | ICD-10-CM

## 2014-05-22 DIAGNOSIS — I2589 Other forms of chronic ischemic heart disease: Secondary | ICD-10-CM

## 2014-05-22 DIAGNOSIS — I739 Peripheral vascular disease, unspecified: Secondary | ICD-10-CM

## 2014-05-22 DIAGNOSIS — I6529 Occlusion and stenosis of unspecified carotid artery: Secondary | ICD-10-CM

## 2014-05-22 DIAGNOSIS — E785 Hyperlipidemia, unspecified: Secondary | ICD-10-CM

## 2014-05-22 DIAGNOSIS — I2510 Atherosclerotic heart disease of native coronary artery without angina pectoris: Secondary | ICD-10-CM

## 2014-05-22 DIAGNOSIS — I255 Ischemic cardiomyopathy: Secondary | ICD-10-CM

## 2014-05-22 LAB — BASIC METABOLIC PANEL
BUN: 29 mg/dL — AB (ref 6–23)
CHLORIDE: 105 meq/L (ref 96–112)
CO2: 28 mEq/L (ref 19–32)
CREATININE: 1.9 mg/dL — AB (ref 0.4–1.5)
Calcium: 9.4 mg/dL (ref 8.4–10.5)
GFR: 37.23 mL/min — ABNORMAL LOW (ref >60.00)
GLUCOSE: 92 mg/dL (ref 70–99)
Potassium: 3.9 mEq/L (ref 3.5–5.1)
Sodium: 140 mEq/L (ref 135–145)

## 2014-05-22 LAB — CBC WITH DIFFERENTIAL/PLATELET
Basophils Absolute: 0 10*3/uL (ref 0.0–0.1)
Basophils Relative: 0.5 % (ref 0.0–3.0)
EOS ABS: 0.2 10*3/uL (ref 0.0–0.7)
EOS PCT: 3 % (ref 0.0–5.0)
HCT: 33.2 % — ABNORMAL LOW (ref 39.0–52.0)
Hemoglobin: 11.2 g/dL — ABNORMAL LOW (ref 13.0–17.0)
LYMPHS ABS: 1.2 10*3/uL (ref 0.7–4.0)
Lymphocytes Relative: 23.4 % (ref 12.0–46.0)
MCHC: 33.8 g/dL (ref 30.0–36.0)
MCV: 96.2 fl (ref 78.0–100.0)
MONO ABS: 0.5 10*3/uL (ref 0.1–1.0)
Monocytes Relative: 9.1 % (ref 3.0–12.0)
Neutro Abs: 3.4 10*3/uL (ref 1.4–7.7)
Neutrophils Relative %: 64 % (ref 43.0–77.0)
PLATELETS: 174 10*3/uL (ref 150.0–400.0)
RBC: 3.45 Mil/uL — ABNORMAL LOW (ref 4.22–5.81)
RDW: 12.8 % (ref 11.5–15.5)
WBC: 5.3 10*3/uL (ref 4.0–10.5)

## 2014-05-22 LAB — LIPID PANEL
Cholesterol: 154 mg/dL (ref 0–200)
HDL: 41.1 mg/dL (ref >39.00)
LDL CALC: 91 mg/dL (ref 0–99)
TRIGLYCERIDES: 112 mg/dL (ref 0.0–149.0)
Total CHOL/HDL Ratio: 4
VLDL: 22.4 mg/dL (ref 0.0–40.0)

## 2014-05-22 NOTE — Patient Instructions (Signed)
Decrease aspirin to 81mg  daily.  Your physician recommends that you return for lab work today--Lipid profile/BNMET/CBCd  Your physician has requested that you have an echocardiogram. Echocardiography is a painless test that uses sound waves to create images of your heart. It provides your doctor with information about the size and shape of your heart and how well your heart's chambers and valves are working. This procedure takes approximately one hour. There are no restrictions for this procedure.  Your physician wants you to follow-up in: 6 months with Dr [name]. (November 2015).  You will receive a reminder letter in the mail two months in advance. If you don't receive a letter, please call our office to schedule the follow-up appointment.

## 2014-05-23 ENCOUNTER — Telehealth: Payer: Self-pay | Admitting: *Deleted

## 2014-05-23 DIAGNOSIS — R944 Abnormal results of kidney function studies: Secondary | ICD-10-CM

## 2014-05-23 DIAGNOSIS — I255 Ischemic cardiomyopathy: Secondary | ICD-10-CM | POA: Insufficient documentation

## 2014-05-23 DIAGNOSIS — Z79899 Other long term (current) drug therapy: Secondary | ICD-10-CM

## 2014-05-23 NOTE — Progress Notes (Signed)
Patient ID: CHENG FUGITT, male   DOB: Nov 15, 1928, 80 y.o.   MRN: [id number] PCP: Dr. Ronnald Ramp  80 yo with history of CKD, CAD s/p CABG, and ischemic cardiomyopathy presents for cardiology followup.  He has been seen by Dr. Verl Blalock in the past and is seen by me for the first time today.  Main complaint is stable leg pain. He has thigh and calf pain in both legs after walking about 2 blocks.  This resolves with rest.  He has a history of stable PAD followed at [facility].  He has rare, mild chest pain only with very heavy exertion.  No significant exertional dyspnea.  He can climb a flight of steps, walk his dog, and garden without problems.  He has lost 35 lbs over the last few years.    Labs (12/14): K 4.8, creatinine 1.5  ECG: NSR, 1st degree AV block, LAFB, poor anterior R wave progression  PMH: 1. CKD 2. HTN 3. Hyperlipidemia 4. AAA: s/p resection with left renal artery bypass in 1997 5. GERD 6. IBS 7. Melanoma s/p reception 8. Diabetes: Diet-controlled 9. Carotid stenosis: Carotid dopplers (4/15) with 40-59% bilateral stenosis.  10. PAD: 9/14 ABIs 0.91 right 1.09 left.  11. CAD: Has super-dominant RCA. s/p CABG 1992.  He had Taxus DES to mid PDA in 5/02. LHC (1/05) with 90% ostial PDA (had DES to this vessel), totally occluded RIMA-PDA, totally occluded PLV, sequential SVG-PLV with 1 branch occluded.  12. Ischemic cardiomyopathy: Echo (12/09) with EF 45-50%, basal to mid inferolateral hypokinesis.   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  FH: Brother with CAD.   ROS: All systems reviewed and negative except as per HPI.   Current Outpatient Prescriptions  Medication Sig Dispense Refill  . ALPRAZolam (XANAX) 0.5 MG tablet Take 0.5-1 tablets (0.25-0.5 mg total) by mouth 3 (three) times daily as needed for sleep or anxiety.  90 tablet  4  . atorvastatin (LIPITOR) 80 MG tablet TAKE 1 TABLET ONCE DAILY.  90 tablet  0  . cilostazol (PLETAL) 100  MG tablet TAKE 1 TABLET TWICE DAILY.  180 tablet  3  . clopidogrel (PLAVIX) 75 MG tablet TAKE 1 TABLET ONCE DAILY.  90 tablet  3  . erythromycin with ethanol (THERAMYCIN) 2 % external solution APPLY TWICE DAILY.  60 mL  11  . fenofibrate (TRICOR) 145 MG tablet TAKE 1 TABLET ONCE DAILY.  90 tablet  3  . FLUoxetine (PROZAC) 10 MG capsule TAKE (1) CAPSULE DAILY.  90 capsule  3  . furosemide (LASIX) 40 MG tablet Take 40 mg by mouth daily.      Marland Kitchen glucosamine-chondroitin 500-400 MG tablet Take 1 tablet by mouth 2 (two) times daily.        Marland Kitchen losartan (COZAAR) 100 MG tablet Take 1 tablet (100 mg total) by mouth daily.  90 tablet  3  . meloxicam (MOBIC) 7.5 MG tablet Take 1 tablet (7.5 mg total) by mouth daily.  90 tablet  3  . Misc Natural Products (NF FORMULAS TESTOSTERONE) CAPS Take 1 capsule by mouth 2 (two) times daily.      . Multiple Vitamin (MULTIVITAMIN) tablet Take 1 tablet by mouth daily.      Marland Kitchen NIFEdipine (PROCARDIA-XL/ADALAT-CC/NIFEDICAL-XL) 30 MG 24 hr tablet Take 30 mg by mouth daily.      Marland Kitchen NITROSTAT 0.4 MG SL tablet DISSOLVE 1 TABLET UNDER TONGUE AS NEEDED FOR CHEST PAIN,MAY REPEAT IN5 MINUTES FOR 2 DOSES.  25 tablet  5  . omega-3 acid ethyl esters (LOVAZA) 1 G capsule Take 1 g by mouth 2 (two) times daily.      . potassium gluconate 595 MG TABS Take 595 mg by mouth daily.      . tamsulosin (FLOMAX) 0.4 MG CAPS capsule Take 1 capsule (0.4 mg total) by mouth daily.  30 capsule  6  . temazepam (RESTORIL) 30 MG capsule Take 30 mg by mouth at bedtime as needed for sleep.      Marland Kitchen ZETIA 10 MG tablet TAKE (1) TABLET DAILY.  90 tablet  3  . aspirin EC 81 MG tablet Take 1 tablet (81 mg total) by mouth daily.       No current facility-administered medications for this visit.     BP 128/64  Pulse 88  Ht 5\' 7"  (1.702 m)  Wt 85.276 kg (188 lb)  BMI 29.44 kg/m2 General: NAD Neck: No JVD, no thyromegaly or thyroid nodule.  Lungs: Clear to auscultation bilaterally with normal respiratory  effort. CV: Nondisplaced PMI.  Heart regular S1/S2, no S3/S4, no murmur.  1+ ankle edema.  No carotid bruit.  Normal pedal pulses.  Abdomen: Soft, nontender, no hepatosplenomegaly, no distention.  Skin: Intact without lesions or rashes.  Neurologic: Alert and oriented x 3.  Psych: Normal affect. Extremities: No clubbing or cyanosis.   Assessment/Plan:  1. CAD: s/p CABG.  No significant ischemic symptoms.   - Can decrease ASA to 81 daily as he is also on Plavix. - Continue Plavix, statin, ARB.  2. Ischemic cardiomyopathy: EF 45-50% on last echo in 12/09.  He appears euvolemic, no significant dyspnea.  I will get an echo to reassess LV systolic function.  If EF is decreased still, he should be on a beta blocker.  3. Carotid stenosis: Repeat carotid dopplers 4/16.  4. PAD: Followed at [facility].  Stable claudication.  He is on cilostazol.  5. Hyperlipidemia: Will check lipids today.  6. CKD: Check BMET today.   Larey Dresser 05/23/2014 11:53 AM

## 2014-05-23 NOTE — Telephone Encounter (Signed)
Spoke with patient and he will return Monday for labs   Notes Recorded by Earvin Hansen on 05/23/2014 at 5:34 PM Left message to call back at home number, no answer work number. Will try again Notes Recorded by Earvin Hansen on 05/23/2014 at 5:33 PM Dr Harrington Challenger spoke with Dr [name] and will just have patient repeat on Monday.

## 2014-05-26 ENCOUNTER — Telehealth: Payer: Self-pay

## 2014-05-26 ENCOUNTER — Other Ambulatory Visit (INDEPENDENT_AMBULATORY_CARE_PROVIDER_SITE_OTHER): Payer: Medicare PPO

## 2014-05-26 ENCOUNTER — Telehealth: Payer: Self-pay | Admitting: Internal Medicine

## 2014-05-26 DIAGNOSIS — H9192 Unspecified hearing loss, left ear: Secondary | ICD-10-CM

## 2014-05-26 DIAGNOSIS — R944 Abnormal results of kidney function studies: Secondary | ICD-10-CM

## 2014-05-26 DIAGNOSIS — Z79899 Other long term (current) drug therapy: Secondary | ICD-10-CM

## 2014-05-26 LAB — BASIC METABOLIC PANEL
BUN: 23 mg/dL (ref 6–23)
CALCIUM: 9.3 mg/dL (ref 8.4–10.5)
CO2: 27 mEq/L (ref 19–32)
Chloride: 104 mEq/L (ref 96–112)
Creatinine, Ser: 1.6 mg/dL — ABNORMAL HIGH (ref 0.4–1.5)
GFR: 45.33 mL/min — AB (ref >60.00)
Glucose, Bld: 105 mg/dL — ABNORMAL HIGH (ref 70–99)
Potassium: 3.7 mEq/L (ref 3.5–5.1)
SODIUM: 139 meq/L (ref 135–145)

## 2014-05-26 MED ORDER — TEMAZEPAM 30 MG PO CAPS: 30.0000 mg | ORAL_CAPSULE | Freq: Every evening | ORAL | Status: DC | PRN

## 2014-05-26 NOTE — Telephone Encounter (Signed)
Pt request referral to [facility], pt is seeing them for hearing aid. Pt has [employer] for insurance and they require referral. Pt has an appt 06/16/14. Please advise.

## 2014-05-26 NOTE — Telephone Encounter (Signed)
Please advise if ok to refill temazepam for this patient. Thanks

## 2014-05-26 NOTE — Telephone Encounter (Signed)
yes

## 2014-05-27 ENCOUNTER — Telehealth: Payer: Self-pay | Admitting: Cardiology

## 2014-05-27 NOTE — Telephone Encounter (Signed)
LMTCB

## 2014-05-27 NOTE — Telephone Encounter (Signed)
New message ° ° ° ° ° °Returning a nurses call °

## 2014-05-27 NOTE — Telephone Encounter (Signed)
Spoke with patient about lab results.

## 2014-06-17 ENCOUNTER — Ambulatory Visit (HOSPITAL_COMMUNITY): Payer: Medicare PPO | Attending: Cardiology | Admitting: Radiology

## 2014-06-17 DIAGNOSIS — I714 Abdominal aortic aneurysm, without rupture, unspecified: Secondary | ICD-10-CM | POA: Insufficient documentation

## 2014-06-17 DIAGNOSIS — I2510 Atherosclerotic heart disease of native coronary artery without angina pectoris: Secondary | ICD-10-CM | POA: Insufficient documentation

## 2014-06-17 DIAGNOSIS — E785 Hyperlipidemia, unspecified: Secondary | ICD-10-CM | POA: Insufficient documentation

## 2014-06-17 DIAGNOSIS — I10 Essential (primary) hypertension: Secondary | ICD-10-CM | POA: Insufficient documentation

## 2014-06-17 DIAGNOSIS — R0602 Shortness of breath: Secondary | ICD-10-CM

## 2014-06-17 DIAGNOSIS — I2589 Other forms of chronic ischemic heart disease: Secondary | ICD-10-CM | POA: Insufficient documentation

## 2014-06-17 DIAGNOSIS — E119 Type 2 diabetes mellitus without complications: Secondary | ICD-10-CM | POA: Insufficient documentation

## 2014-06-17 DIAGNOSIS — Z87891 Personal history of nicotine dependence: Secondary | ICD-10-CM | POA: Insufficient documentation

## 2014-06-17 DIAGNOSIS — N289 Disorder of kidney and ureter, unspecified: Secondary | ICD-10-CM | POA: Insufficient documentation

## 2014-06-17 NOTE — Progress Notes (Signed)
Echocardiogram performed.  

## 2014-06-19 ENCOUNTER — Ambulatory Visit (INDEPENDENT_AMBULATORY_CARE_PROVIDER_SITE_OTHER): Payer: Medicare PPO | Admitting: Podiatrist

## 2014-06-19 DIAGNOSIS — M79609 Pain in unspecified limb: Secondary | ICD-10-CM

## 2014-06-19 DIAGNOSIS — M79673 Pain in unspecified foot: Secondary | ICD-10-CM

## 2014-06-19 DIAGNOSIS — B351 Tinea unguium: Secondary | ICD-10-CM

## 2014-06-19 DIAGNOSIS — I739 Peripheral vascular disease, unspecified: Secondary | ICD-10-CM

## 2014-06-19 NOTE — Progress Notes (Signed)
HPI: Patient presents today for follow up of foot and nail care. Denies any new complaints today.  Objective: Patients chart is reviewed. Vascular status reveals pedal pulses noted at 2 out of 4 dp and pt bilateral . Neurological sensation is Normal to Semmes Weinstein monofilament bilateral. Patients nails are thickened, discolored, distrophic, friable and brittle with yellow-brown discoloration. Patient subjectively relates they are painful with shoes and with ambulation of bilateral feet.  Assessment: Symptomatic onychomycosis  Plan: Discussed treatment options and alternatives. The symptomatic toenails were debrided through manual an mechanical means without complication. Return appointment recommended at routine intervals of 3 months   

## 2014-06-20 ENCOUNTER — Other Ambulatory Visit: Payer: Self-pay | Admitting: Internal Medicine

## 2014-06-23 ENCOUNTER — Other Ambulatory Visit: Payer: Self-pay | Admitting: Internal Medicine

## 2014-06-26 ENCOUNTER — Ambulatory Visit: Payer: Medicare PPO | Admitting: Podiatrist

## 2014-07-03 ENCOUNTER — Encounter: Payer: Self-pay | Admitting: Internal Medicine

## 2014-07-03 ENCOUNTER — Ambulatory Visit: Payer: Medicare PPO | Admitting: Podiatrist

## 2014-07-07 ENCOUNTER — Ambulatory Visit: Payer: Medicare PPO | Admitting: Internal Medicine

## 2014-07-09 ENCOUNTER — Other Ambulatory Visit: Payer: Self-pay | Admitting: Internal Medicine

## 2014-07-10 ENCOUNTER — Encounter: Payer: Self-pay | Admitting: Cardiology

## 2014-07-17 ENCOUNTER — Other Ambulatory Visit: Payer: Self-pay | Admitting: Internal Medicine

## 2014-07-21 ENCOUNTER — Other Ambulatory Visit (INDEPENDENT_AMBULATORY_CARE_PROVIDER_SITE_OTHER): Payer: Medicare PPO

## 2014-07-21 ENCOUNTER — Encounter: Payer: Self-pay | Admitting: Internal Medicine

## 2014-07-21 ENCOUNTER — Ambulatory Visit (INDEPENDENT_AMBULATORY_CARE_PROVIDER_SITE_OTHER): Payer: Medicare PPO | Admitting: Internal Medicine

## 2014-07-21 VITALS — BP 140/72 | HR 86 | Temp 99.0°F | Resp 16 | Ht 67.0 in | Wt 189.0 lb

## 2014-07-21 DIAGNOSIS — M545 Low back pain, unspecified: Secondary | ICD-10-CM | POA: Insufficient documentation

## 2014-07-21 DIAGNOSIS — F341 Dysthymic disorder: Secondary | ICD-10-CM

## 2014-07-21 DIAGNOSIS — E1165 Type 2 diabetes mellitus with hyperglycemia: Secondary | ICD-10-CM

## 2014-07-21 DIAGNOSIS — I6529 Occlusion and stenosis of unspecified carotid artery: Secondary | ICD-10-CM

## 2014-07-21 DIAGNOSIS — E1129 Type 2 diabetes mellitus with other diabetic kidney complication: Secondary | ICD-10-CM

## 2014-07-21 DIAGNOSIS — K219 Gastro-esophageal reflux disease without esophagitis: Secondary | ICD-10-CM

## 2014-07-21 DIAGNOSIS — I10 Essential (primary) hypertension: Secondary | ICD-10-CM

## 2014-07-21 DIAGNOSIS — E785 Hyperlipidemia, unspecified: Secondary | ICD-10-CM

## 2014-07-21 LAB — HEMOGLOBIN A1C: Hgb A1c MFr Bld: 6.1 % (ref 4.6–6.5)

## 2014-07-21 MED ORDER — METHYLPREDNISOLONE ACETATE 80 MG/ML IJ SUSP
80.0000 mg | Freq: Once | INTRAMUSCULAR | Status: AC
  Administered 2014-07-21: 80 mg via INTRAMUSCULAR

## 2014-07-21 NOTE — Assessment & Plan Note (Signed)
I will recheck his A1C and will address if needed

## 2014-07-21 NOTE — Patient Instructions (Signed)
Back Pain, Adult Low back pain is very common. About 1 in 5 people have back pain.The cause of low back pain is rarely dangerous. The pain often gets better over time.About half of people with a sudden onset of back pain feel better in just 2 weeks. About 8 in 10 people feel better by 6 weeks.  CAUSES Some common causes of back pain include:  Strain of the muscles or ligaments supporting the spine.  Wear and tear (degeneration) of the spinal discs.  Arthritis.  Direct injury to the back. DIAGNOSIS Most of the time, the direct cause of low back pain is not known.However, back pain can be treated effectively even when the exact cause of the pain is unknown.Answering your caregiver's questions about your overall health and symptoms is one of the most accurate ways to make sure the cause of your pain is not dangerous. If your caregiver needs more information, he or she may order lab work or imaging tests (X-rays or MRIs).However, even if imaging tests show changes in your back, this usually does not require surgery. HOME CARE INSTRUCTIONS For many people, back pain returns.Since low back pain is rarely dangerous, it is often a condition that people can learn to manageon their own.   Remain active. It is stressful on the back to sit or stand in one place. Do not sit, drive, or stand in one place for more than 30 minutes at a time. Take short walks on level surfaces as soon as pain allows.Try to increase the length of time you walk each day.  Do not stay in bed.Resting more than 1 or 2 days can delay your recovery.  Do not avoid exercise or work.Your body is made to move.It is not dangerous to be active, even though your back may hurt.Your back will likely heal faster if you return to being active before your pain is gone.  Pay attention to your body when you bend and lift. Many people have less discomfortwhen lifting if they bend their knees, keep the load close to their bodies,and  avoid twisting. Often, the most comfortable positions are those that put less stress on your recovering back.  Find a comfortable position to sleep. Use a firm mattress and lie on your side with your knees slightly bent. If you lie on your back, put a pillow under your knees.  Only take over-the-counter or prescription medicines as directed by your caregiver. Over-the-counter medicines to reduce pain and inflammation are often the most helpful.Your caregiver may prescribe muscle relaxant drugs.These medicines help dull your pain so you can more quickly return to your normal activities and healthy exercise.  Put ice on the injured area.  Put ice in a plastic bag.  Place a towel between your skin and the bag.  Leave the ice on for 15-20 minutes, 03-04 times a day for the first 2 to 3 days. After that, ice and heat may be alternated to reduce pain and spasms.  Ask your caregiver about trying back exercises and gentle massage. This may be of some benefit.  Avoid feeling anxious or stressed.Stress increases muscle tension and can worsen back pain.It is important to recognize when you are anxious or stressed and learn ways to manage it.Exercise is a great option. SEEK MEDICAL CARE IF:  You have pain that is not relieved with rest or medicine.  You have pain that does not improve in 1 week.  You have new symptoms.  You are generally not feeling well. SEEK   IMMEDIATE MEDICAL CARE IF:   You have pain that radiates from your back into your legs.  You develop new bowel or bladder control problems.  You have unusual weakness or numbness in your arms or legs.  You develop nausea or vomiting.  You develop abdominal pain.  You feel faint. Document Released: 12/12/2005 Document Revised: 06/12/2012 Document Reviewed: 04/15/2014 Exit[employer] Patient Information 2015 [employer]. This information is not intended to replace advice given to you by your health care provider. Make sure you  discuss any questions you have with your health care provider.  

## 2014-07-21 NOTE — Assessment & Plan Note (Signed)
His LBP is improving, he was not willing to do a plain film today He will cont the nsaids and will give an injection of depo-medrol IM for additional sx relief

## 2014-07-21 NOTE — Progress Notes (Signed)
Subjective:    Patient ID: Andrew [name], male    DOB: [birth year], 80 y.o.   MRN: [id number]  Back Pain This is a new problem. The current episode started 1 to 4 weeks ago. The problem occurs intermittently. The problem has been gradually improving since onset. The pain is present in the lumbar spine. The quality of the pain is described as aching. The pain does not radiate. The pain is at a severity of 3/10. The pain is mild. The pain is worse during the day. The symptoms are aggravated by bending and position. Pertinent negatives include no abdominal pain, bladder incontinence, bowel incontinence, chest pain, dysuria, fever, headaches, leg pain, numbness, paresis, paresthesias, pelvic pain, perianal numbness, tingling, weakness or weight loss. He has tried NSAIDs for the symptoms. The treatment provided moderate relief.      Review of Systems  Constitutional: Negative.  Negative for fever, chills, weight loss, diaphoresis, appetite change and fatigue.  HENT: Negative.   Eyes: Negative.   Respiratory: Negative.  Negative for cough, choking, chest tightness, shortness of breath and stridor.   Cardiovascular: Negative.  Negative for chest pain, palpitations and leg swelling.  Gastrointestinal: Negative.  Negative for nausea, vomiting, abdominal pain, diarrhea, constipation and bowel incontinence.  Endocrine: Negative.  Negative for polydipsia, polyphagia and polyuria.  Genitourinary: Negative.  Negative for bladder incontinence, dysuria and pelvic pain.  Musculoskeletal: Positive for back pain. Negative for arthralgias, joint swelling, myalgias, neck pain and neck stiffness.  Skin: Negative.  Negative for rash.  Allergic/Immunologic: Negative.   Neurological: Negative.  Negative for tingling, weakness, numbness, headaches and paresthesias.  Hematological: Negative.  Negative for adenopathy. Does not bruise/bleed easily.  Psychiatric/Behavioral: Negative.        Objective:   Physical  Exam  Vitals reviewed. Constitutional: He is oriented to person, place, and time. He appears well-developed and well-nourished. No distress.  HENT:  Head: Normocephalic and atraumatic.  Mouth/Throat: Oropharynx is clear and moist. No oropharyngeal exudate.  Eyes: Conjunctivae are normal. Right eye exhibits no discharge. Left eye exhibits no discharge. No scleral icterus.  Neck: Normal range of motion. Neck supple. No JVD present. No tracheal deviation present. No thyromegaly present.  Cardiovascular: Normal rate, regular rhythm, normal heart sounds and intact distal pulses.  Exam reveals no gallop and no friction rub.   No murmur heard. Pulmonary/Chest: Effort normal and breath sounds normal. No stridor. No respiratory distress. He has no wheezes. He has no rales. He exhibits no tenderness.  Abdominal: Soft. Bowel sounds are normal. He exhibits no distension and no mass. There is no tenderness. There is no rebound and no guarding.  Musculoskeletal: Normal range of motion. He exhibits no edema and no tenderness.       Lumbar back: Normal. He exhibits normal range of motion, no tenderness, no bony tenderness, no swelling, no edema, no deformity, no laceration, no pain, no spasm and normal pulse.  Lymphadenopathy:    He has no cervical adenopathy.  Neurological: He is alert and oriented to person, place, and time. He has normal strength. He displays no atrophy, no tremor and normal reflexes. No cranial nerve deficit or sensory deficit. He exhibits normal muscle tone. He displays a negative Romberg sign. He displays no seizure activity. Coordination and gait normal.  Reflex Scores:      Tricep reflexes are 0 on the right side and 0 on the left side.      Bicep reflexes are 0 on the right side and 0 on  the left side.      Brachioradialis reflexes are 0 on the right side and 0 on the left side.      Patellar reflexes are 0 on the right side and 0 on the left side.      Achilles reflexes are 0 on the  right side and 0 on the left side. Skin: Skin is warm and dry. No rash noted. He is not diaphoretic. No erythema. No pallor.  Psychiatric: He has a normal mood and affect. His behavior is normal. Judgment and thought content normal.     Lab Results  Component Value Date   WBC 5.3 05/22/2014   HGB 11.2* 05/22/2014   HCT 33.2* 05/22/2014   PLT 174.0 05/22/2014   GLUCOSE 105* 05/26/2014   CHOL 154 05/22/2014   TRIG 112.0 05/22/2014   HDL 41.10 05/22/2014   LDLDIRECT 78.6 03/18/2010   LDLCALC 91 05/22/2014   ALT 17 03/29/2013   AST 24 03/29/2013   NA 139 05/26/2014   K 3.7 05/26/2014   CL 104 05/26/2014   CREATININE 1.6* 05/26/2014   BUN 23 05/26/2014   CO2 27 05/26/2014   TSH 1.416 03/28/2013   PSA 2.79 01/23/2013   INR 1.10 03/28/2013   HGBA1C 6.4 12/02/2013   MICROALBUR 45.5* 03/18/2010       Assessment & Plan:

## 2014-07-21 NOTE — Assessment & Plan Note (Signed)
His BP is well controlled 

## 2014-07-21 NOTE — Progress Notes (Signed)
Pre visit review using our clinic review tool, if applicable. No additional management support is needed unless otherwise documented below in the visit note. 

## 2014-08-27 ENCOUNTER — Other Ambulatory Visit: Payer: Self-pay | Admitting: Internal Medicine

## 2014-08-28 ENCOUNTER — Ambulatory Visit: Payer: Medicare PPO | Admitting: Podiatrist

## 2014-09-11 ENCOUNTER — Ambulatory Visit (INDEPENDENT_AMBULATORY_CARE_PROVIDER_SITE_OTHER): Payer: Medicare PPO | Admitting: Podiatrist

## 2014-09-11 DIAGNOSIS — I739 Peripheral vascular disease, unspecified: Secondary | ICD-10-CM

## 2014-09-11 DIAGNOSIS — B351 Tinea unguium: Secondary | ICD-10-CM

## 2014-09-11 DIAGNOSIS — M79676 Pain in unspecified toe(s): Secondary | ICD-10-CM

## 2014-09-11 DIAGNOSIS — M79609 Pain in unspecified limb: Secondary | ICD-10-CM

## 2014-09-12 ENCOUNTER — Other Ambulatory Visit: Payer: Self-pay | Admitting: Internal Medicine

## 2014-09-12 NOTE — Progress Notes (Signed)
HPI: Patient presents today for follow up of foot and nail care. Denies any new complaints today.  Objective: Patients chart is reviewed. Vascular status reveals pedal pulses noted at 2 out of 4 dp and pt bilateral . Neurological sensation is Normal to Semmes Weinstein monofilament bilateral. Patients nails are thickened, discolored, distrophic, friable and brittle with yellow-brown discoloration. Patient subjectively relates they are painful with shoes and with ambulation of bilateral feet.  Assessment: Symptomatic onychomycosis  Plan: Discussed treatment options and alternatives. The symptomatic toenails were debrided through manual an mechanical means without complication. Return appointment recommended at routine intervals of 3 months   

## 2014-09-15 ENCOUNTER — Other Ambulatory Visit: Payer: Self-pay | Admitting: *Deleted

## 2014-09-15 DIAGNOSIS — Z48812 Encounter for surgical aftercare following surgery on the circulatory system: Secondary | ICD-10-CM

## 2014-09-15 DIAGNOSIS — I701 Atherosclerosis of renal artery: Secondary | ICD-10-CM

## 2014-09-29 ENCOUNTER — Encounter: Payer: Self-pay | Admitting: Family

## 2014-09-30 ENCOUNTER — Ambulatory Visit (HOSPITAL_COMMUNITY)
Admission: RE | Admit: 2014-09-30 | Discharge: 2014-09-30 | Disposition: A | Discharge status: Home / Self Care | Payer: Medicare PPO | Source: Ambulatory Visit | Attending: Family | Admitting: Family

## 2014-09-30 ENCOUNTER — Encounter: Payer: Self-pay | Admitting: Family

## 2014-09-30 ENCOUNTER — Ambulatory Visit (INDEPENDENT_AMBULATORY_CARE_PROVIDER_SITE_OTHER)
Admission: RE | Admit: 2014-09-30 | Discharge: 2014-09-30 | Disposition: A | Discharge status: Home / Self Care | Payer: Medicare PPO | Source: Ambulatory Visit | Attending: Family | Admitting: Family

## 2014-09-30 ENCOUNTER — Other Ambulatory Visit: Payer: Self-pay | Admitting: Family

## 2014-09-30 ENCOUNTER — Other Ambulatory Visit: Payer: Self-pay | Admitting: Vascular Surgery

## 2014-09-30 ENCOUNTER — Other Ambulatory Visit (HOSPITAL_COMMUNITY): Payer: Medicare PPO

## 2014-09-30 ENCOUNTER — Ambulatory Visit (INDEPENDENT_AMBULATORY_CARE_PROVIDER_SITE_OTHER): Payer: Medicare PPO | Admitting: Family

## 2014-09-30 VITALS — BP 141/78 | HR 44 | Resp 16 | Ht 67.0 in | Wt 184.0 lb

## 2014-09-30 DIAGNOSIS — I701 Atherosclerosis of renal artery: Secondary | ICD-10-CM

## 2014-09-30 DIAGNOSIS — I6523 Occlusion and stenosis of bilateral carotid arteries: Secondary | ICD-10-CM

## 2014-09-30 DIAGNOSIS — I6529 Occlusion and stenosis of unspecified carotid artery: Secondary | ICD-10-CM | POA: Insufficient documentation

## 2014-09-30 DIAGNOSIS — Z48812 Encounter for surgical aftercare following surgery on the circulatory system: Secondary | ICD-10-CM

## 2014-09-30 DIAGNOSIS — I714 Abdominal aortic aneurysm, without rupture, unspecified: Secondary | ICD-10-CM | POA: Insufficient documentation

## 2014-09-30 DIAGNOSIS — I739 Peripheral vascular disease, unspecified: Secondary | ICD-10-CM

## 2014-09-30 NOTE — Progress Notes (Signed)
[facility] [place] HISTORY AND PHYSICAL   MRN : [id number]  History of Present Illness:   Andrew [name] is a 80 y.o. male patient of Dr. Kellie [name] who is status post AAA repair and left renal artery bypass graft 1997 by Dr. Amedeo [name].  He also has PAD. He returns today for follow up. After walking a couple of blocks he has bilateral buttocks, thighs, and calves aching; this has been improving over time.  After resting for 30 seconds he can resume walking.  He does have known arthritis. He denies rest pain, denies non-healing wounds or ulcers.  Patient states that his blood pressure is usually 123456 systolic.   Patient has a negative history of TIA or stroke symptoms, specifically he denies amaurosis fugax or monocular blindness, denies facial drooping, denies hemiplegia, denies receptive or expressive aphasia.   The patient has not had sudden back or abdominal pain.  He uses a stair stepper exerciser three times/week.  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  He is in a study for a medication that raises HDL. He had an intentional weight loss of 40 pounds since early 2014.  Pt Diabetic: No, is now diet controlled  Pt smoker: former smoker, quit at age 60   Pt meds include:  Statin :Yes  Betablocker: No  ASA: Yes  Other anticoagulants/antiplatelets: Plavix, Pletal   Current Outpatient Prescriptions  Medication Sig Dispense Refill  . ALPRAZolam (XANAX) 0.5 MG tablet TAKE 1/2 TO 1 TABLET BY MOUTH 3 TIMES DAILY AS NEEDED FOR SLEEP/ANXIETY.  90 tablet  3  . aspirin EC 81 MG tablet Take 1 tablet (81 mg total) by mouth daily.      Marland Kitchen atorvastatin (LIPITOR) 80 MG tablet TAKE 1 TABLET ONCE DAILY.  90 tablet  0  . cilostazol (PLETAL) 100 MG tablet TAKE 1 TABLET TWICE DAILY.  180 tablet  3  . clopidogrel (PLAVIX) 75 MG tablet TAKE 1 TABLET ONCE DAILY.  90 tablet  3  . erythromycin with ethanol (THERAMYCIN) 2 % external solution APPLY TWICE DAILY.  60 mL  11  . fenofibrate (TRICOR)  145 MG tablet TAKE 1 TABLET ONCE DAILY.  90 tablet  3  . FLUoxetine (PROZAC) 10 MG capsule TAKE (1) CAPSULE DAILY.  90 capsule  3  . furosemide (LASIX) 40 MG tablet TAKE 1 TABLET EACH DAY.  90 tablet  1  . glucosamine-chondroitin 500-400 MG tablet Take 1 tablet by mouth 2 (two) times daily.        Marland Kitchen losartan (COZAAR) 100 MG tablet Take 1 tablet (100 mg total) by mouth daily.  90 tablet  3  . meloxicam (MOBIC) 7.5 MG tablet Take 1 tablet (7.5 mg total) by mouth daily.  90 tablet  3  . Misc Natural Products (NF FORMULAS TESTOSTERONE) CAPS Take 1 capsule by mouth 2 (two) times daily.      . Multiple Vitamin (MULTIVITAMIN) tablet Take 1 tablet by mouth daily.      Marland Kitchen NIFEdipine (PROCARDIA-XL/ADALAT-CC/NIFEDICAL-XL) 30 MG 24 hr tablet Take 30 mg by mouth daily.      Marland Kitchen NITROSTAT 0.4 MG SL tablet DISSOLVE 1 TABLET UNDER TONGUE AS NEEDED FOR CHEST PAIN,MAY REPEAT IN5 MINUTES FOR 2 DOSES.  25 tablet  5  . omega-3 acid ethyl esters (LOVAZA) 1 G capsule Take 1 g by mouth 2 (two) times daily.      . potassium gluconate 595 MG TABS Take 595 mg by mouth daily.      . tamsulosin (  FLOMAX) 0.4 MG CAPS capsule TAKE (1) CAPSULE DAILY.  30 capsule  11  . temazepam (RESTORIL) 30 MG capsule Take 1 capsule (30 mg total) by mouth at bedtime as needed for sleep.  30 capsule  5  . ZETIA 10 MG tablet TAKE (1) TABLET DAILY.  90 tablet  3   No current facility-administered medications for this visit.    Past Medical History  Diagnosis Date  . CAD (coronary artery disease)   . LVF (left ventricular failure)   . Carotid artery stenosis   . Hyperlipidemia   . Hypertension   . GERD (gastroesophageal reflux disease)   . IBS (irritable bowel syndrome)   . Arthritis   . Diabetes mellitus   . Thyroid disease   . Anemia   . Vitamin D deficiency   . Depression   . Fatty liver 2008  . Cancer     Melanoma - Back  . AAA (abdominal aortic aneurysm)   . Pneumonia Jan. 2014  . CHF (congestive heart failure)   . DVT (deep  venous thrombosis)     Social History History  Substance Use Topics  . 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 Smokeless tobacco: Never Used     Comment: quit atleast 25 yrs ago, per pt  . Alcohol Use: 1.8 oz/week    3 Shots of liquor per week     Comment: socially    Family History Family History  Problem Relation Age of Onset  . Colon cancer Father   . Cancer Father   . Coronary artery disease Brother   . Diabetes Brother   . Cancer Brother     throat and abdominal  . Heart disease Brother   . Hyperlipidemia Brother   . Peripheral vascular disease Brother     Varicose Veins  . Diabetes Son   . Hyperlipidemia Son     Surgical History Past Surgical History  Procedure Laterality Date  . Total knee arthroplasty      bilateral- Pt fell 3 steps  . Cardiac stents  2005  . Coronary artery bypass graft  1992  . Joint replacement      Bilateral knee arthroplasty  . Eye surgery      Catarart  . Abdominal aortic aneurysm repair  2002    Allergies  Allergen Reactions  . Metoprolol Shortness Of Breath and Palpitations    Heart starts racing. Shallow breathing   . Metformin And Related Nausea And Vomiting    Current Outpatient Prescriptions  Medication Sig Dispense Refill  . ALPRAZolam (XANAX) 0.5 MG tablet TAKE 1/2 TO 1 TABLET BY MOUTH 3 TIMES DAILY AS NEEDED FOR SLEEP/ANXIETY.  90 tablet  3  . aspirin EC 81 MG tablet Take 1 tablet (81 mg total) by mouth daily.      Marland Kitchen atorvastatin (LIPITOR) 80 MG tablet TAKE 1 TABLET ONCE DAILY.  90 tablet  0  . cilostazol (PLETAL) 100 MG tablet TAKE 1 TABLET TWICE DAILY.  180 tablet  3  . clopidogrel (PLAVIX) 75 MG tablet TAKE 1 TABLET ONCE DAILY.  90 tablet  3  . erythromycin with ethanol (THERAMYCIN) 2 % external solution APPLY TWICE DAILY.  60 mL  11  . fenofibrate (TRICOR) 145 MG tablet TAKE 1 TABLET ONCE DAILY.  90 tablet  3  . FLUoxetine (PROZAC) 10 MG capsule TAKE (1) CAPSULE DAILY.  90 capsule  3  . furosemide (LASIX) 40 MG tablet  TAKE 1 TABLET EACH DAY.  90 tablet  1  .  glucosamine-chondroitin 500-400 MG tablet Take 1 tablet by mouth 2 (two) times daily.        Marland Kitchen losartan (COZAAR) 100 MG tablet Take 1 tablet (100 mg total) by mouth daily.  90 tablet  3  . meloxicam (MOBIC) 7.5 MG tablet Take 1 tablet (7.5 mg total) by mouth daily.  90 tablet  3  . Misc Natural Products (NF FORMULAS TESTOSTERONE) CAPS Take 1 capsule by mouth 2 (two) times daily.      . Multiple Vitamin (MULTIVITAMIN) tablet Take 1 tablet by mouth daily.      Marland Kitchen NIFEdipine (PROCARDIA-XL/ADALAT-CC/NIFEDICAL-XL) 30 MG 24 hr tablet Take 30 mg by mouth daily.      Marland Kitchen NITROSTAT 0.4 MG SL tablet DISSOLVE 1 TABLET UNDER TONGUE AS NEEDED FOR CHEST PAIN,MAY REPEAT IN5 MINUTES FOR 2 DOSES.  25 tablet  5  . omega-3 acid ethyl esters (LOVAZA) 1 G capsule Take 1 g by mouth 2 (two) times daily.      . potassium gluconate 595 MG TABS Take 595 mg by mouth daily.      . tamsulosin (FLOMAX) 0.4 MG CAPS capsule TAKE (1) CAPSULE DAILY.  30 capsule  11  . temazepam (RESTORIL) 30 MG capsule Take 1 capsule (30 mg total) by mouth at bedtime as needed for sleep.  30 capsule  5  . ZETIA 10 MG tablet TAKE (1) TABLET DAILY.  90 tablet  3   No current facility-administered medications for this visit.     REVIEW OF SYSTEMS: See HPI for pertinent positives and negatives.  Physical Examination Filed Vitals:   09/30/14 1207 09/30/14 1210  BP: 134/77 141/78  Pulse: 71 44  Resp:  16  Height:  5\' 7"  (1.702 m)  Weight:  184 lb (83.462 kg)  SpO2:  97%   Body mass index is 28.81 kg/(m^2).  General: WDWN in NAD  Gait: Normal  HENT: WNL  Eyes: Pupils equal  Pulmonary: normal non-labored breathing , without Rales, rhonchi, wheezing  Cardiac: RRR, no detected Murmur.   Abdomen: soft, NT, no masses palpated. Skin: no rashes, no ulcers noted; no Gangrene , no cellulitis; no open wounds.  VASCULAR EXAM  Carotid Bruits  Left  Right    Negative  Negative    VASCULAR EXAM:   Extremities without ischemic changes  without Gangrene; without open wounds.   LE Pulses  LEFT  RIGHT   FEMORAL  palpable  palpable   POPLITEAL  not palpable  not palpable   POSTERIOR TIBIAL  not palpable  not palpable   DORSALIS PEDIS  ANTERIOR TIBIAL  2+palpable  2+palpable    Musculoskeletal: no muscle wasting or atrophy; no edema  Neurologic: A&O X 3; Appropriate Affect ;  SENSATION: normal;  MOTOR FUNCTION: 5/5 Symmetric, CN 2-12 intact  Speech is fluent/normal   Non-Invasive Vascular Imaging (09/30/2014):   CEREBROVASCULAR DUPLEX EVALUATION    INDICATION: Carotid artery stenosis    PREVIOUS INTERVENTION(S): N/A    DUPLEX EXAM:     RIGHT  LEFT  Peak Systolic Velocities (cm/s) End Diastolic Velocities (cm/s) Plaque LOCATION Peak Systolic Velocities (cm/s) End Diastolic Velocities (cm/s) Plaque  110 14  CCA PROXIMAL 119 11 HT  94 13 HT CCA MID 85 13 HT  72 14 CP CCA DISTAL 81 17 CP  115 10 CP ECA 116 8 CP  182 42 CP ICA PROXIMAL 112 26 CP  123 30  ICA MID 70 20   70 13  ICA DISTAL 72 13  1.94 ICA / CCA Ratio (PSV) 1.32  Antegrade Vertebral Flow Antegrade  [id number] Brachial Systolic Pressure (mmHg) 123456  Triphasic Brachial Artery Waveforms Triphasic    Plaque Morphology:  HM = Homogeneous, HT = Heterogeneous, CP = Calcific Plaque, SP = Smooth Plaque, IP = Irregular Plaque     ADDITIONAL FINDINGS:     IMPRESSION: Right internal carotid artery stenosis present in the 40%-59% range, which may be underestimated due to calcific plaque present making Doppler interrogation difficult. Left internal carotid artery stenosis present of less than 40%.    Compared to the previous exam:  No previous study to compare.     RENAL ARTERY DUPLEX EVALUATION    INDICATION: Renal artery stenosis    PREVIOUS INTERVENTION(S): Left renal artery bypass graft 1997    DUPLEX EXAM:     AORTA Peak Systolic Velocity (cm/s): 76    RIGHT  LEFT   Peak Systolic Velocities (cm/s)  Comments  Peak Systolic Velocities (cm/s) Comments  n/v  Renal Artery Origin/Proximal 133/69   n/v  Renal Artery Mid 77   n/v  Renal Artery Distal 50   n/v  Accessory Renal Artery (when present)    n/v  Renal / Aortic Ratio (RAR) 1.75  n/v Kidney Size (cm) 13.6  n/v  Renal Vein Spontaneous and Phasic    ADDITIONAL FINDINGS:     IMPRESSION: Patent abdominal aorta with history of resection for abdominal aortic repair, minimal hyperplasia present at the proximal resection without hemodynamically significant changes present. Note is made of left distal common iliac artery aneurysmal dilatation measuring 1.57cm x 1.96cm, without intramural thrombus present. Patent left renal artery bypass graft, no hemodynamically significant plaque or changes are visualized.    Compared to the previous exam:  No previous Duplex to compare.   LOWER EXTREMITY ARTERIAL DUPLEX EVALUATION    INDICATION: Peripheral vascular disease    PREVIOUS INTERVENTION(S): History of abdominal aortic aneurysm resection and left renal artery bypass 1997    DUPLEX EXAM:     RIGHT  LEFT   Peak Systolic Velocity (cm/s) Ratio (if abnormal) Waveform  Peak Systolic Velocity (cm/s) Ratio (if abnormal) Waveform  150  B Common Femoral Artery 198  B  162  B Deep Femoral Artery 158  B  0  occluded Superficial Femoral Artery Proximal 200/75/138  B/B/B  31  B Superficial Femoral Artery Mid 61/129/132 2.1 B/B/B  81  B Superficial Femoral Artery Distal 50/225/44 4.5 B/B/B  40/57  B/B Popliteal Artery 77/56  B/B  17  M Posterior Tibial Artery Dist 22  B  47  B Anterior Tibial Artery Distal 46  B  43  B Peroneal Artery Distal 35  B  1.03/1.35 Today's ABI / TBI 1.08/1.15  0.91/0.90 Previous ABI / TBI (09/17/2013  ) 1.09/0.99    Waveform:    M - Monophasic       B - Biphasic       T - Triphasic  If Ankle Brachial Index (ABI) or Toe Brachial Index (TBI) performed, please see complete report  ADDITIONAL FINDINGS:     IMPRESSION:  Hemodynamically significant stenosis of greater than 50% present involving the left mid/distal superficial femoral artery. No color or spectral Doppler waveform could be obtained involving the right proximal/mid superficial femoral artery suggestive of vessel occlusion. Remainder of the bilateral lower extremity arterial systems remain patent.    Compared to the previous exam:  Stable ankle brachial indices since previous study on 09/17/2013.     ASSESSMENT:  Andrew [name] is a 80 y.o. male who is status post AAA repair and left renal artery bypass graft 1997 by Dr. Amedeo [name].  He also has PAD with moderate claudication, no tissue loss. Bilateral LE arterial Duplex today demonstrates a hemodynamically significant stenosis of greater than 50% present involving the left mid/distal superficial femoral artery. No color or spectral Doppler waveform could be obtained involving the right proximal/mid superficial femoral artery suggestive of vessel occlusion. Normal and stable ankle brachial indices since previous study on 09/17/2013, waveforms are all biphasic. Remainder of the bilateral lower extremity arterial systems remain patent. Today's renal artery Duplex demonstrates a patent abdominal aorta with history of resection for abdominal aortic repair, minimal hyperplasia present at the proximal resection without hemodynamically significant changes present. Note is made of left distal common iliac artery aneurysmal dilatation measuring 1.57cm x 1.96cm, without intramural thrombus present. Patent left renal artery bypass graft, no hemodynamically significant plaque or changes are visualized. His blood pressure is normal.  Today's carotid Duplex demonstrates right internal carotid artery stenosis in the 40%-59% range, which may be underestimated due to calcific plaque present making Doppler interrogation difficult. Left internal carotid artery stenosis less than 40%. He has no history of TIA or stroke  but does have a history of CAD, LVF, and CHF. He exercises daily.   PLAN:   Based on today's exam and non-invasive vascular lab results, and after discussing with Dr. Kellie [name], the patient will follow up in 1 year with the following tests: aortoiliac Duplex, ABI's, carotid Duplex. I discussed in depth with the patient the nature of atherosclerosis, and emphasized the importance of maximal medical management including strict control of blood pressure, blood glucose, and lipid levels, obtaining regular exercise, and cessation of smoking.  The patient is aware that without maximal medical management the underlying atherosclerotic disease process will progress, limiting the benefit of any interventions.  The patient was given information about stroke prevention and what symptoms should prompt the patient to seek immediate medical care.  The patient was given information about PAD including signs, symptoms, treatment, what symptoms should prompt the patient to seek immediate medical care, and risk reduction measures to take. Thank you for allowing [place] to participate in this patient's care.  Clemon Chambers, RN, MSN, FNP-C [facility] Office: [phone]  Clinic MD: Early 09/30/2014 12:23 PM

## 2014-09-30 NOTE — Patient Instructions (Signed)

## 2014-10-02 ENCOUNTER — Other Ambulatory Visit: Payer: Self-pay | Admitting: *Deleted

## 2014-10-02 DIAGNOSIS — I739 Peripheral vascular disease, unspecified: Secondary | ICD-10-CM

## 2014-10-02 DIAGNOSIS — I714 Abdominal aortic aneurysm, without rupture, unspecified: Secondary | ICD-10-CM

## 2014-10-02 DIAGNOSIS — I779 Disorder of arteries and arterioles, unspecified: Secondary | ICD-10-CM

## 2014-10-28 ENCOUNTER — Ambulatory Visit (INDEPENDENT_AMBULATORY_CARE_PROVIDER_SITE_OTHER): Payer: Medicare PPO | Admitting: Cardiology

## 2014-10-28 ENCOUNTER — Telehealth: Payer: Self-pay | Admitting: Physician Assistant

## 2014-10-28 ENCOUNTER — Encounter: Payer: Self-pay | Admitting: *Deleted

## 2014-10-28 ENCOUNTER — Inpatient Hospital Stay (HOSPITAL_COMMUNITY): Admission: AD | Admit: 2014-10-28 | Payer: Medicare PPO | Source: Ambulatory Visit | Admitting: Cardiology

## 2014-10-28 ENCOUNTER — Encounter: Payer: Self-pay | Admitting: Cardiology

## 2014-10-28 ENCOUNTER — Other Ambulatory Visit: Payer: Self-pay | Admitting: *Deleted

## 2014-10-28 VITALS — BP 100/52 | HR 80 | Ht 67.0 in | Wt 184.8 lb

## 2014-10-28 DIAGNOSIS — I25110 Atherosclerotic heart disease of native coronary artery with unstable angina pectoris: Secondary | ICD-10-CM

## 2014-10-28 DIAGNOSIS — N189 Chronic kidney disease, unspecified: Secondary | ICD-10-CM

## 2014-10-28 DIAGNOSIS — I10 Essential (primary) hypertension: Secondary | ICD-10-CM

## 2014-10-28 DIAGNOSIS — I208 Other forms of angina pectoris: Secondary | ICD-10-CM | POA: Insufficient documentation

## 2014-10-28 DIAGNOSIS — E785 Hyperlipidemia, unspecified: Secondary | ICD-10-CM

## 2014-10-28 DIAGNOSIS — N182 Chronic kidney disease, stage 2 (mild): Secondary | ICD-10-CM

## 2014-10-28 DIAGNOSIS — I739 Peripheral vascular disease, unspecified: Secondary | ICD-10-CM

## 2014-10-28 LAB — CBC WITH DIFFERENTIAL/PLATELET
Basophils Absolute: 0 10*3/uL (ref 0.0–0.1)
Basophils Relative: 0.6 % (ref 0.0–3.0)
EOS ABS: 0.1 10*3/uL (ref 0.0–0.7)
Eosinophils Relative: 2 % (ref 0.0–5.0)
HCT: 38.1 % — ABNORMAL LOW (ref 39.0–52.0)
Hemoglobin: 12.6 g/dL — ABNORMAL LOW (ref 13.0–17.0)
LYMPHS PCT: 23.8 % (ref 12.0–46.0)
Lymphs Abs: 1.3 10*3/uL (ref 0.7–4.0)
MCHC: 33 g/dL (ref 30.0–36.0)
MCV: 98.5 fl (ref 78.0–100.0)
MONOS PCT: 9 % (ref 3.0–12.0)
Monocytes Absolute: 0.5 10*3/uL (ref 0.1–1.0)
NEUTROS PCT: 64.6 % (ref 43.0–77.0)
Neutro Abs: 3.7 10*3/uL (ref 1.4–7.7)
Platelets: 175 10*3/uL (ref 150.0–400.0)
RBC: 3.87 Mil/uL — AB (ref 4.22–5.81)
RDW: 13.3 % (ref 11.5–15.5)
WBC: 5.7 10*3/uL (ref 4.0–10.5)

## 2014-10-28 LAB — BASIC METABOLIC PANEL
BUN: 27 mg/dL — ABNORMAL HIGH (ref 6–23)
CO2: 31 mEq/L (ref 19–32)
CREATININE: 1.6 mg/dL — AB (ref 0.4–1.5)
Calcium: 9.8 mg/dL (ref 8.4–10.5)
Chloride: 103 mEq/L (ref 96–112)
GFR: 43.35 mL/min — ABNORMAL LOW (ref >60.00)
GLUCOSE: 94 mg/dL (ref 70–99)
Potassium: 3.9 mEq/L (ref 3.5–5.1)
Sodium: 142 mEq/L (ref 135–145)

## 2014-10-28 LAB — PROTIME-INR
INR: 1 ratio (ref 0.8–1.0)
PROTHROMBIN TIME: 11.5 s (ref 9.6–13.1)

## 2014-10-28 LAB — TROPONIN I: TROPONIN I: 0.05 ng/mL (ref <0.06)

## 2014-10-28 LAB — APTT: APTT: 27.8 s (ref 23.4–32.7)

## 2014-10-28 NOTE — Assessment & Plan Note (Signed)
controlled 

## 2014-10-28 NOTE — Assessment & Plan Note (Signed)
Followed by [facility]. Last ABIs were normal.

## 2014-10-28 NOTE — Assessment & Plan Note (Signed)
Check labs today.

## 2014-10-28 NOTE — Telephone Encounter (Signed)
     I spoke with Mr Litterio and notified him that his troponin returned negative. He was appreciative of the call.    Angelena Form PA-C  [facility]

## 2014-10-28 NOTE — Assessment & Plan Note (Signed)
Lipid Panel     Component Value Date/Time   CHOL 154 05/22/2014 1430   TRIG 112.0 05/22/2014 1430   HDL 41.10 05/22/2014 1430   CHOLHDL 4 05/22/2014 1430   VLDL 22.4 05/22/2014 1430   LDLCALC 91 05/22/2014 1430   LDLDIRECT 78.6 03/18/2010 0854   Continue current meds.

## 2014-10-28 NOTE — Assessment & Plan Note (Signed)
Hx CABG 1992, per cath report in 2009 3 VGs to RCA with hx of stent to PDA.

## 2014-10-28 NOTE — Assessment & Plan Note (Addendum)
Cresendo angina over the last 6 months, today with 30 min of angina at rest.  Will admit, r/o MI, serial troponin and plan for cath tomorrow.  Discussed 1% chance of stroke, heart attack or death on table.  Also chance of bleeding with artery.  Pt agrees to proceed.  Pt decided after bed was obtained that he could not miss work on [employer].  Therefore against our advice he will have labs today including troponin and come in for elective cath on Thursday.  If pain returns he will go to ER.  Dr. Radford Pax has seen pt as well.

## 2014-10-28 NOTE — Patient Instructions (Addendum)
Your physician recommends that you continue on your current medications as directed. Please refer to the Current Medication list given to you today.    Your physician has requested that you have a cardiac catheterization. Cardiac catheterization is used to diagnose and/or treat various heart conditions. Doctors may recommend this procedure for a number of different reasons. The most common reason is to evaluate chest pain. Chest pain can be a symptom of coronary artery disease (CAD), and cardiac catheterization can show whether plaque is narrowing or blocking your heart's arteries. This procedure is also used to evaluate the valves, as well as measure the blood flow and oxygen levels in different parts of your heart. For further information please visit [email or web address]. Please follow instruction sheet, as given.    Your physician recommends that you return for lab work  TODAY CBC W/DIFF BMP PT/INR  PTT

## 2014-10-28 NOTE — Progress Notes (Signed)
10/28/2014   PCP: [name], MD   Chief Complaint  Patient presents with  . Acute Visit    chest pain, cough    Primary Cardiologist:Dr. [name]  HPI:  80 yo with history of CKD, CAD s/p CABG, and ischemic cardiomyopathy presents for cardiology followup. He has been seen by Dr. Verl Blalock in the past and is now seen by Dr. [name].  This resolves with rest. He has a history of stable PAD followed at [facility]. He has rare, mild chest pain only with very heavy exertion. No significant exertional dyspnea. He can climb a flight of steps, walk his dog, and garden without problems. He exercises on the stair climber.  Over last 6 months his episodes of chest pain have increased to 2 a week. In August he was playing water volleyball and had to stop for chest pain and take NTG with relief.  Today after waking he developed chest pain midsternal without radiation and no associated symptoms.  It lasted 30 min, longer than the brief episodes he normally has. He was seen at [facility] today and sent for office visit.  No recurrence of chest pain.  EKG without acute changes.  He does have mild post nasal drip cough.    Last Echo 06/17/14   - Left ventricle: The cavity size was normal. Wall thickness was increased in a pattern of mild LVH. Systolic function was mildly reduced. The estimated ejection fraction was in the range of 45% to 50%. There is akinesis of the mid-apicalinferolateral myocardium. Doppler parameters are consistent with abnormal left ventricular relaxation (grade 1 diastolic dysfunction). Doppler parameters are consistent with high ventricular filling pressure. - Mitral valve: Moderately calcified annulus. - Left atrium: The atrium was moderately dilated.  Last cath 2009.  Allergies  Allergen Reactions  . Metoprolol Shortness Of Breath and Palpitations    Heart starts racing. Shallow breathing   . Metformin And Related Nausea And Vomiting    Current  Outpatient Prescriptions  Medication Sig Dispense Refill  . ALPRAZolam (XANAX) 0.5 MG tablet TAKE 1/2 TO 1 TABLET BY MOUTH 3 TIMES DAILY AS NEEDED FOR SLEEP/ANXIETY. 90 tablet 3  . aspirin EC 81 MG tablet Take 1 tablet (81 mg total) by mouth daily.    Marland Kitchen atorvastatin (LIPITOR) 80 MG tablet TAKE 1 TABLET ONCE DAILY. 90 tablet 0  . cilostazol (PLETAL) 100 MG tablet TAKE 1 TABLET TWICE DAILY. 180 tablet 3  . clopidogrel (PLAVIX) 75 MG tablet TAKE 1 TABLET ONCE DAILY. 90 tablet 3  . erythromycin with ethanol (THERAMYCIN) 2 % external solution APPLY TWICE DAILY. 60 mL 11  . fenofibrate (TRICOR) 145 MG tablet TAKE 1 TABLET ONCE DAILY. 90 tablet 3  . FLUoxetine (PROZAC) 10 MG capsule TAKE (1) CAPSULE DAILY. 90 capsule 3  . furosemide (LASIX) 40 MG tablet TAKE 1 TABLET EACH DAY. 90 tablet 1  . glucosamine-chondroitin 500-400 MG tablet Take 1 tablet by mouth 2 (two) times daily.      Marland Kitchen losartan (COZAAR) 100 MG tablet Take 1 tablet (100 mg total) by mouth daily. 90 tablet 3  . meloxicam (MOBIC) 7.5 MG tablet Take 1 tablet (7.5 mg total) by mouth daily. 90 tablet 3  . Misc Natural Products (NF FORMULAS TESTOSTERONE) CAPS Take 1 capsule by mouth 2 (two) times daily.    . Multiple Vitamin (MULTIVITAMIN) tablet Take 1 tablet by mouth daily.    Marland Kitchen NIFEdipine (PROCARDIA-XL/ADALAT-CC/NIFEDICAL-XL) 30 MG 24 hr tablet Take 30  mg by mouth daily.    Marland Kitchen NITROSTAT 0.4 MG SL tablet DISSOLVE 1 TABLET UNDER TONGUE AS NEEDED FOR CHEST PAIN,MAY REPEAT IN5 MINUTES FOR 2 DOSES. 25 tablet 5  . omega-3 acid ethyl esters (LOVAZA) 1 G capsule Take 1 g by mouth 2 (two) times daily.    . potassium gluconate 595 MG TABS Take 595 mg by mouth daily.    . tamsulosin (FLOMAX) 0.4 MG CAPS capsule TAKE (1) CAPSULE DAILY. 30 capsule 11  . temazepam (RESTORIL) 30 MG capsule Take 1 capsule (30 mg total) by mouth at bedtime as needed for sleep. 30 capsule 5  . ZETIA 10 MG tablet TAKE (1) TABLET DAILY. 90 tablet 3   No current  facility-administered medications for this visit.    Past Medical History  Diagnosis Date  . CAD (coronary artery disease)   . LVF (left ventricular failure)   . Carotid artery stenosis   . Hyperlipidemia   . Hypertension   . GERD (gastroesophageal reflux disease)   . IBS (irritable bowel syndrome)   . Arthritis   . Diabetes mellitus   . Thyroid disease   . Anemia   . Vitamin D deficiency   . Depression   . Fatty liver 2008  . Cancer     Melanoma - Back  . AAA (abdominal aortic aneurysm)   . Pneumonia Jan. 2014  . CHF (congestive heart failure)   . DVT (deep venous thrombosis)     Past Surgical History  Procedure Laterality Date  . Total knee arthroplasty      bilateral- Pt fell 3 steps  . Cardiac stents  2005  . Coronary artery bypass graft  1992  . Joint replacement      Bilateral knee arthroplasty  . Eye surgery      Catarart  . Abdominal aortic aneurysm repair  2002  . Cardiac catheterization      [id number] colds or fevers, no weight changes Skin:no rashes or ulcers HEENT:no blurred vision, no congestion CV:see HPI PUL:see HPI GI:no diarrhea constipation or melena, no indigestion GU:no hematuria, no dysuria MS:no joint pain, no claudication Neuro:no syncope, no lightheadedness Endo:no diabetes, no thyroid disease  Wt Readings from Last 3 Encounters:  10/28/14 184 lb 12.8 oz (83.825 kg)  09/30/14 184 lb (83.462 kg)  07/21/14 189 lb (85.73 kg)    PHYSICAL EXAM BP 100/52 mmHg  Pulse 80  Ht 5\' 7"  (1.702 m)  Wt 184 lb 12.8 oz (83.825 kg)  BMI 28.94 kg/m2  SpO2 97% General:Pleasant affect, NAD Skin:Warm and dry, brisk capillary refill HEENT:normocephalic, sclera clear, mucus membranes moist Neck:supple, no JVD, no bruits, no adenopathy  Heart:S1S2 RRR without murmur, gallup, rub or click Lungs:clear without rales, rhonchi, or wheezes [id number], non tender, + BS, do not palpate liver spleen or masses Ext:no lower ext edema, 2+ pedal pulses, 2+  radial pulses Neuro:alert and oriented X 3, MAE, follows commands, + facial symmetry  EKG:SR no acute changes from previous EKG  ASSESSMENT AND PLAN Angina decubitus Cresendo angina over the last 6 months, today with 30 min of angina at rest.  Will admit, r/o MI, serial troponin and plan for cath tomorrow.  Discussed 1% chance of stroke, heart attack or death on table.  Also chance of bleeding with artery.  Pt agrees to proceed.  Pt decided after bed was obtained that he could not miss work on [employer].  Therefore against our advice he will have labs today including troponin and come in for elective  cath on Thursday.  If pain returns he will go to ER.  Dr. Radford Pax has seen pt as well.   CORONARY ATHEROSCLEROSIS NATIVE CORONARY ARTERY Hx CABG 1992, per cath report in 2009 3 VGs to RCA with hx of stent to PDA.      Chronic renal insufficiency, stage II (mild) Check labs today  Hyperlipidemia with target LDL less than 100 Lipid Panel     Component Value Date/Time   CHOL 154 05/22/2014 1430   TRIG 112.0 05/22/2014 1430   HDL 41.10 05/22/2014 1430   CHOLHDL 4 05/22/2014 1430   VLDL 22.4 05/22/2014 1430   LDLCALC 91 05/22/2014 1430   LDLDIRECT 78.6 03/18/2010 0854   Continue current meds.  Peripheral vascular disease Followed by [facility]. Last ABIs were normal.  Essential hypertension controlled    I have personally seen, interviewed and examined patient.  He has a history of CAD with CABG in 1992 and subsequent PCI in 2005.  He has a history of chronic stable angina but over the past 6 months recently has increased.  He had an episode in August with exertion where he had to stop playing water volleyball due to CP and had to take NTG which relieved his CP.  Today he was sent over to the office due to chest pain that was midsternal and lasted about 30 minutes.  Currently he is pain free.  It was recommended that the patient be admitted for rule out and plan for cath in the am but patient refused.   He stated that he had too many things to do at home and could not go now.  I explained to him that he could be at risk for MI or death.  He understands and is willing to take the risk.  He is going to have cath as an outpt on Thursday.  He was instructed to go to the ER immediately if he has any chest pain that does not resolve with NTG or rest.  He agrees.

## 2014-10-30 ENCOUNTER — Ambulatory Visit (HOSPITAL_COMMUNITY)
Admission: RE | Admit: 2014-10-30 | Discharge: 2014-10-30 | Disposition: A | Discharge status: Home / Self Care | Payer: Medicare PPO | Source: Ambulatory Visit | Attending: Cardiology | Admitting: Cardiology

## 2014-10-30 ENCOUNTER — Encounter (HOSPITAL_COMMUNITY)
Admission: RE | Disposition: A | Discharge status: Home / Self Care | Payer: Self-pay | Source: Ambulatory Visit | Attending: Cardiology

## 2014-10-30 DIAGNOSIS — I2510 Atherosclerotic heart disease of native coronary artery without angina pectoris: Secondary | ICD-10-CM | POA: Insufficient documentation

## 2014-10-30 DIAGNOSIS — Z7902 Long term (current) use of antithrombotics/antiplatelets: Secondary | ICD-10-CM | POA: Insufficient documentation

## 2014-10-30 DIAGNOSIS — I2582 Chronic total occlusion of coronary artery: Secondary | ICD-10-CM | POA: Diagnosis not present

## 2014-10-30 DIAGNOSIS — N182 Chronic kidney disease, stage 2 (mild): Secondary | ICD-10-CM | POA: Insufficient documentation

## 2014-10-30 DIAGNOSIS — K219 Gastro-esophageal reflux disease without esophagitis: Secondary | ICD-10-CM | POA: Diagnosis not present

## 2014-10-30 DIAGNOSIS — E119 Type 2 diabetes mellitus without complications: Secondary | ICD-10-CM | POA: Diagnosis not present

## 2014-10-30 DIAGNOSIS — I129 Hypertensive chronic kidney disease with stage 1 through stage 4 chronic kidney disease, or unspecified chronic kidney disease: Secondary | ICD-10-CM | POA: Insufficient documentation

## 2014-10-30 DIAGNOSIS — Z7982 Long term (current) use of aspirin: Secondary | ICD-10-CM | POA: Diagnosis not present

## 2014-10-30 DIAGNOSIS — Z791 Long term (current) use of non-steroidal anti-inflammatories (NSAID): Secondary | ICD-10-CM | POA: Insufficient documentation

## 2014-10-30 DIAGNOSIS — Z79899 Other long term (current) drug therapy: Secondary | ICD-10-CM | POA: Diagnosis not present

## 2014-10-30 DIAGNOSIS — R079 Chest pain, unspecified: Secondary | ICD-10-CM | POA: Diagnosis present

## 2014-10-30 DIAGNOSIS — T82858A Stenosis of vascular prosthetic devices, implants and grafts, initial encounter: Secondary | ICD-10-CM | POA: Insufficient documentation

## 2014-10-30 DIAGNOSIS — Z951 Presence of aortocoronary bypass graft: Secondary | ICD-10-CM | POA: Diagnosis not present

## 2014-10-30 DIAGNOSIS — Y831 Surgical operation with implant of artificial internal device as the cause of abnormal reaction of the patient, or of later complication, without mention of misadventure at the time of the procedure: Secondary | ICD-10-CM | POA: Diagnosis not present

## 2014-10-30 DIAGNOSIS — I509 Heart failure, unspecified: Secondary | ICD-10-CM | POA: Insufficient documentation

## 2014-10-30 HISTORY — PX: LEFT HEART CATHETERIZATION WITH CORONARY ANGIOGRAM: SHX5451

## 2014-10-30 LAB — BASIC METABOLIC PANEL
ANION GAP: 12 (ref 5–15)
BUN: 30 mg/dL — ABNORMAL HIGH (ref 6–23)
CALCIUM: 9.7 mg/dL (ref 8.4–10.5)
CO2: 26 mEq/L (ref 19–32)
CREATININE: 1.54 mg/dL — AB (ref 0.50–1.35)
Chloride: 105 mEq/L (ref 96–112)
GFR calc Af Amer: 46 mL/min — ABNORMAL LOW (ref >90)
GFR calc non Af Amer: 40 mL/min — ABNORMAL LOW (ref >90)
Glucose, Bld: 92 mg/dL (ref 70–99)
Potassium: 4.1 mEq/L (ref 3.7–5.3)
Sodium: 143 mEq/L (ref 137–147)

## 2014-10-30 LAB — GLUCOSE, CAPILLARY: Glucose-Capillary: 90 mg/dL (ref 70–99)

## 2014-10-30 SURGERY — LEFT HEART CATHETERIZATION WITH CORONARY ANGIOGRAM
Anesthesia: LOCAL

## 2014-10-30 MED ORDER — SODIUM CHLORIDE 0.9 % IV SOLN
INTRAVENOUS | Status: DC
  Administered 2014-10-30: 12:00:00 via INTRAVENOUS

## 2014-10-30 MED ORDER — ASPIRIN 81 MG PO CHEW: 81.0000 mg | CHEWABLE_TABLET | ORAL | Status: DC

## 2014-10-30 MED ORDER — SODIUM CHLORIDE 0.9 % IJ SOLN: 3.0000 mL | INTRAMUSCULAR | Status: DC | PRN

## 2014-10-30 MED ORDER — HEPARIN (PORCINE) IN NACL 2-0.9 UNIT/ML-% IJ SOLN
INTRAMUSCULAR | Status: AC
  Filled 2014-10-30: qty 1500

## 2014-10-30 MED ORDER — NITROGLYCERIN 1 MG/10 ML FOR IR/CATH LAB
INTRA_ARTERIAL | Status: AC
  Filled 2014-10-30: qty 10

## 2014-10-30 MED ORDER — SODIUM CHLORIDE 0.9 % IV SOLN: 250.0000 mL | INTRAVENOUS | Status: DC | PRN

## 2014-10-30 MED ORDER — VERAPAMIL HCL 2.5 MG/ML IV SOLN
INTRAVENOUS | Status: AC
  Filled 2014-10-30: qty 2

## 2014-10-30 MED ORDER — ONDANSETRON HCL 4 MG/2ML IJ SOLN: 4.0000 mg | Freq: Four times a day (QID) | INTRAMUSCULAR | Status: DC | PRN

## 2014-10-30 MED ORDER — SODIUM CHLORIDE 0.9 % IV SOLN: INTRAVENOUS | Status: DC

## 2014-10-30 MED ORDER — LIDOCAINE HCL (PF) 1 % IJ SOLN
INTRAMUSCULAR | Status: AC
Start: 2014-10-30 — End: 2014-10-30
  Filled 2014-10-30: qty 30

## 2014-10-30 MED ORDER — MIDAZOLAM HCL 2 MG/2ML IJ SOLN
INTRAMUSCULAR | Status: AC
  Filled 2014-10-30: qty 2

## 2014-10-30 MED ORDER — HEPARIN SODIUM (PORCINE) 1000 UNIT/ML IJ SOLN
INTRAMUSCULAR | Status: AC
  Filled 2014-10-30: qty 1

## 2014-10-30 MED ORDER — FENTANYL CITRATE 0.05 MG/ML IJ SOLN
INTRAMUSCULAR | Status: AC
  Filled 2014-10-30: qty 2

## 2014-10-30 MED ORDER — ACETAMINOPHEN 325 MG PO TABS: 650.0000 mg | ORAL_TABLET | ORAL | Status: DC | PRN

## 2014-10-30 MED ORDER — SODIUM CHLORIDE 0.9 % IJ SOLN: 3.0000 mL | Freq: Two times a day (BID) | INTRAMUSCULAR | Status: DC

## 2014-10-30 NOTE — H&P (View-Only) (Signed)
10/28/2014   PCP: [name], MD   Chief Complaint  Patient presents with  . Acute Visit    chest pain, cough    Primary Cardiologist:Dr. [name]  HPI:  80 yo with history of CKD, CAD s/p CABG, and ischemic cardiomyopathy presents for cardiology followup. He has been seen by Dr. Verl Blalock in the past and is now seen by Dr. [name].  This resolves with rest. He has a history of stable PAD followed at [facility]. He has rare, mild chest pain only with very heavy exertion. No significant exertional dyspnea. He can climb a flight of steps, walk his dog, and garden without problems. He exercises on the stair climber.  Over last 6 months his episodes of chest pain have increased to 2 a week. In August he was playing water volleyball and had to stop for chest pain and take NTG with relief.  Today after waking he developed chest pain midsternal without radiation and no associated symptoms.  It lasted 30 min, longer than the brief episodes he normally has. He was seen at [facility] today and sent for office visit.  No recurrence of chest pain.  EKG without acute changes.  He does have mild post nasal drip cough.    Last Echo 06/17/14   - Left ventricle: The cavity size was normal. Wall thickness was increased in a pattern of mild LVH. Systolic function was mildly reduced. The estimated ejection fraction was in the range of 45% to 50%. There is akinesis of the mid-apicalinferolateral myocardium. Doppler parameters are consistent with abnormal left ventricular relaxation (grade 1 diastolic dysfunction). Doppler parameters are consistent with high ventricular filling pressure. - Mitral valve: Moderately calcified annulus. - Left atrium: The atrium was moderately dilated.  Last cath 2009.  Allergies  Allergen Reactions  . Metoprolol Shortness Of Breath and Palpitations    Heart starts racing. Shallow breathing   . Metformin And Related Nausea And Vomiting    Current  Outpatient Prescriptions  Medication Sig Dispense Refill  . ALPRAZolam (XANAX) 0.5 MG tablet TAKE 1/2 TO 1 TABLET BY MOUTH 3 TIMES DAILY AS NEEDED FOR SLEEP/ANXIETY. 90 tablet 3  . aspirin EC 81 MG tablet Take 1 tablet (81 mg total) by mouth daily.    Marland Kitchen atorvastatin (LIPITOR) 80 MG tablet TAKE 1 TABLET ONCE DAILY. 90 tablet 0  . cilostazol (PLETAL) 100 MG tablet TAKE 1 TABLET TWICE DAILY. 180 tablet 3  . clopidogrel (PLAVIX) 75 MG tablet TAKE 1 TABLET ONCE DAILY. 90 tablet 3  . erythromycin with ethanol (THERAMYCIN) 2 % external solution APPLY TWICE DAILY. 60 mL 11  . fenofibrate (TRICOR) 145 MG tablet TAKE 1 TABLET ONCE DAILY. 90 tablet 3  . FLUoxetine (PROZAC) 10 MG capsule TAKE (1) CAPSULE DAILY. 90 capsule 3  . furosemide (LASIX) 40 MG tablet TAKE 1 TABLET EACH DAY. 90 tablet 1  . glucosamine-chondroitin 500-400 MG tablet Take 1 tablet by mouth 2 (two) times daily.      Marland Kitchen losartan (COZAAR) 100 MG tablet Take 1 tablet (100 mg total) by mouth daily. 90 tablet 3  . meloxicam (MOBIC) 7.5 MG tablet Take 1 tablet (7.5 mg total) by mouth daily. 90 tablet 3  . Misc Natural Products (NF FORMULAS TESTOSTERONE) CAPS Take 1 capsule by mouth 2 (two) times daily.    . Multiple Vitamin (MULTIVITAMIN) tablet Take 1 tablet by mouth daily.    Marland Kitchen NIFEdipine (PROCARDIA-XL/ADALAT-CC/NIFEDICAL-XL) 30 MG 24 hr tablet Take 30  mg by mouth daily.    Marland Kitchen NITROSTAT 0.4 MG SL tablet DISSOLVE 1 TABLET UNDER TONGUE AS NEEDED FOR CHEST PAIN,MAY REPEAT IN5 MINUTES FOR 2 DOSES. 25 tablet 5  . omega-3 acid ethyl esters (LOVAZA) 1 G capsule Take 1 g by mouth 2 (two) times daily.    . potassium gluconate 595 MG TABS Take 595 mg by mouth daily.    . tamsulosin (FLOMAX) 0.4 MG CAPS capsule TAKE (1) CAPSULE DAILY. 30 capsule 11  . temazepam (RESTORIL) 30 MG capsule Take 1 capsule (30 mg total) by mouth at bedtime as needed for sleep. 30 capsule 5  . ZETIA 10 MG tablet TAKE (1) TABLET DAILY. 90 tablet 3   No current  facility-administered medications for this visit.    Past Medical History  Diagnosis Date  . CAD (coronary artery disease)   . LVF (left ventricular failure)   . Carotid artery stenosis   . Hyperlipidemia   . Hypertension   . GERD (gastroesophageal reflux disease)   . IBS (irritable bowel syndrome)   . Arthritis   . Diabetes mellitus   . Thyroid disease   . Anemia   . Vitamin D deficiency   . Depression   . Fatty liver 2008  . Cancer     Melanoma - Back  . AAA (abdominal aortic aneurysm)   . Pneumonia Jan. 2014  . CHF (congestive heart failure)   . DVT (deep venous thrombosis)     Past Surgical History  Procedure Laterality Date  . Total knee arthroplasty      bilateral- Pt fell 3 steps  . Cardiac stents  2005  . Coronary artery bypass graft  1992  . Joint replacement      Bilateral knee arthroplasty  . Eye surgery      Catarart  . Abdominal aortic aneurysm repair  2002  . Cardiac catheterization      [id number] colds or fevers, no weight changes Skin:no rashes or ulcers HEENT:no blurred vision, no congestion CV:see HPI PUL:see HPI GI:no diarrhea constipation or melena, no indigestion GU:no hematuria, no dysuria MS:no joint pain, no claudication Neuro:no syncope, no lightheadedness Endo:no diabetes, no thyroid disease  Wt Readings from Last 3 Encounters:  10/28/14 184 lb 12.8 oz (83.825 kg)  09/30/14 184 lb (83.462 kg)  07/21/14 189 lb (85.73 kg)    PHYSICAL EXAM BP 100/52 mmHg  Pulse 80  Ht 5\' 7"  (1.702 m)  Wt 184 lb 12.8 oz (83.825 kg)  BMI 28.94 kg/m2  SpO2 97% General:Pleasant affect, NAD Skin:Warm and dry, brisk capillary refill HEENT:normocephalic, sclera clear, mucus membranes moist Neck:supple, no JVD, no bruits, no adenopathy  Heart:S1S2 RRR without murmur, gallup, rub or click Lungs:clear without rales, rhonchi, or wheezes [id number], non tender, + BS, do not palpate liver spleen or masses Ext:no lower ext edema, 2+ pedal pulses, 2+  radial pulses Neuro:alert and oriented X 3, MAE, follows commands, + facial symmetry  EKG:SR no acute changes from previous EKG  ASSESSMENT AND PLAN Angina decubitus Cresendo angina over the last 6 months, today with 30 min of angina at rest.  Will admit, r/o MI, serial troponin and plan for cath tomorrow.  Discussed 1% chance of stroke, heart attack or death on table.  Also chance of bleeding with artery.  Pt agrees to proceed.  Pt decided after bed was obtained that he could not miss work on [employer].  Therefore against our advice he will have labs today including troponin and come in for elective  cath on Thursday.  If pain returns he will go to ER.  Dr. Radford Pax has seen pt as well.   CORONARY ATHEROSCLEROSIS NATIVE CORONARY ARTERY Hx CABG 1992, per cath report in 2009 3 VGs to RCA with hx of stent to PDA.      Chronic renal insufficiency, stage II (mild) Check labs today  Hyperlipidemia with target LDL less than 100 Lipid Panel     Component Value Date/Time   CHOL 154 05/22/2014 1430   TRIG 112.0 05/22/2014 1430   HDL 41.10 05/22/2014 1430   CHOLHDL 4 05/22/2014 1430   VLDL 22.4 05/22/2014 1430   LDLCALC 91 05/22/2014 1430   LDLDIRECT 78.6 03/18/2010 0854   Continue current meds.  Peripheral vascular disease Followed by [facility]. Last ABIs were normal.  Essential hypertension controlled    I have personally seen, interviewed and examined patient.  He has a history of CAD with CABG in 1992 and subsequent PCI in 2005.  He has a history of chronic stable angina but over the past 6 months recently has increased.  He had an episode in August with exertion where he had to stop playing water volleyball due to CP and had to take NTG which relieved his CP.  Today he was sent over to the office due to chest pain that was midsternal and lasted about 30 minutes.  Currently he is pain free.  It was recommended that the patient be admitted for rule out and plan for cath in the am but patient refused.   He stated that he had too many things to do at home and could not go now.  I explained to him that he could be at risk for MI or death.  He understands and is willing to take the risk.  He is going to have cath as an outpt on Thursday.  He was instructed to go to the ER immediately if he has any chest pain that does not resolve with NTG or rest.  He agrees.

## 2014-10-30 NOTE — Discharge Instructions (Signed)
Radial Site Care °Refer to this sheet in the next few weeks. These instructions provide you with information on caring for yourself after your procedure. Your caregiver may also give you more specific instructions. Your treatment has been planned according to current medical practices, but problems sometimes occur. Call your caregiver if you have any problems or questions after your procedure. °HOME CARE INSTRUCTIONS °· You may shower the day after the procedure. Remove the bandage (dressing) and gently wash the site with plain soap and water. Gently pat the site dry. °· Do not apply powder or lotion to the site. °· Do not submerge the affected site in water for 3 to 5 days. °· Inspect the site at least twice daily. °· Do not flex or bend the affected arm for 24 hours. °· No lifting over 5 pounds (2.3 kg) for 5 days after your procedure. °· Do not drive home if you are discharged the same day of the procedure. Have someone else drive you. °· You may drive 24 hours after the procedure unless otherwise instructed by your caregiver. °· Do not operate machinery or power tools for 24 hours. °· A responsible adult should be with you for the first 24 hours after you arrive home. °What to expect: °· Any bruising will usually fade within 1 to 2 weeks. °· Blood that collects in the tissue (hematoma) may be painful to the touch. It should usually decrease in size and tenderness within 1 to 2 weeks. °SEEK IMMEDIATE MEDICAL CARE IF: °· You have unusual pain at the radial site. °· You have redness, warmth, swelling, or pain at the radial site. °· You have drainage (other than a small amount of blood on the dressing). °· You have chills. °· You have a fever or persistent symptoms for more than 72 hours. °· You have a fever and your symptoms suddenly get worse. °· Your arm becomes pale, cool, tingly, or numb. °· You have heavy bleeding from the site. Hold pressure on the site and call 911. °Document Released: 01/14/2011 Document  Revised: 03/05/2012 Document Reviewed: 01/14/2011 °[employer]® Patient Information ©2015 [employer]. This information is not intended to replace advice given to you by your health care provider. Make sure you discuss any questions you have with your health care provider. ° °

## 2014-10-30 NOTE — Interval H&P Note (Signed)
Cath Lab Visit (complete for each Cath Lab visit)  Clinical Evaluation Leading to the Procedure:   ACS: No.  Non-ACS:    Anginal Classification: CCS IV  Anti-ischemic medical therapy: Minimal Therapy (1 class of medications)  Non-Invasive Test Results: No non-invasive testing performed  Prior CABG: Previous CABG      History and Physical Interval Note:  10/30/2014 12:32 PM  Andrew [name]  has presented today for surgery, with the diagnosis of cp at rest  The various methods of treatment have been discussed with the patient and family. After consideration of risks, benefits and other options for treatment, the patient has consented to  Procedure(s): LEFT HEART CATHETERIZATION WITH CORONARY ANGIOGRAM (N/A) as a surgical intervention .  The patient's history has been reviewed, patient examined, no change in status, stable for surgery.  I have reviewed the patient's chart and labs.  Questions were answered to the patient's satisfaction.     Andrew [name]

## 2014-10-30 NOTE — CV Procedure (Addendum)
    Cardiac Catheterization Procedure Note  Name: Andrew [name] MRN: [id number] DOB: 05-10-1928  Procedure: Left Heart Cath, Selective Coronary Angiography, SVG angiography  Indication: Chest pain concerning for accelerated angina.    Procedural Details: The right wrist was prepped, draped, and anesthetized with 1% lidocaine. Using the modified Seldinger technique, a 5 French sheath was introduced into the right radial artery. 3 mg of verapamil was administered through the sheath, weight-based unfractionated heparin was administered intravenously. JR4 was used to engage the SVG-PDA and the RCA, Williams right catheter used to engage anomalous left coronary artery. Catheter exchanges were performed over an exchange length guidewire. There were no immediate procedural complications. A TR band was used for radial hemostasis at the completion of the procedure.  The patient was transferred to the post catheterization recovery area for further monitoring.  Procedural Findings: Hemodynamics: AO 118/53 LV 118/11  Coronary angiography: Coronary dominance: right  Left main: Arises from the right cusp in very close proximity to the ostium of the RCA.  The left main is a long, tortuous vessel.  It is heavily calcified at the ostium with probably around 40-50% ostial stenosis.  There is a small atrial branch that arises from the right cusp in close proximity to the left main ostium.  This branch has 50-60% ostial/proximal stenosis.  Left circumflex (LCx): This vessel is relatively small and gives rise to several small obtuse marginal vessels.  There are mild to moderate diffuse luminal irregularities.    Left anterior descending (LAD): The LAD is occluded proximally.  The mid to distal LAD fills by collaterals from the PDA.  The LAD is a relatively small vessel also given the large RCA.   Right coronary artery (RCA): The RCA was a large, super-dominant vessel.  The proximal to mid RCA is severely  ectatic.  Mid RCA stent patent with about 50% in-stent restenosis and good flow down the vessel.  Diffuse mid to moderate disease throughout the large RCA in the 30-40% stenosis range.  50% ostial PDA. PDA wraps around the apex.  SVG-PLV is patent, supplying a relatively small PLV branch.  30% stenosis at the ostium of the graft.  The right internal mammary artery graft to the PDA is known to be occluded and was not injected.  The PDA provides collaterals to the totally occluded LAD.   Left ventriculography: Not done, CKD  Contrast: 108 cc  Final Conclusions:  Diffuse CAD, described above.  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  Patient has a chronic total occlusion of the LAD (relatively small LAD) with good collaterals from the RCA.  This would be very difficult to approach for PCI.  There is moderate disease in multiple locations, including calcified ostial left main with probably 40-50% ostial stenosis.  LM stenosis probably is nonobstructive but would also be very difficult to approach percutaneously.  Would plan medical management at this time.  Will start Imdur 30 mg daily.  Will hold losartan for 1 day and Lasix until Monday will hydrate with 100 cc/hr NS x 6 hours post-cath. Close followup.    Loralie Champagne MD, [facility] 10/30/2014, 1:28 PM

## 2014-11-14 ENCOUNTER — Ambulatory Visit (INDEPENDENT_AMBULATORY_CARE_PROVIDER_SITE_OTHER): Payer: Medicare PPO | Admitting: Cardiology

## 2014-11-14 VITALS — BP 118/70 | HR 76 | Ht 67.0 in | Wt 188.0 lb

## 2014-11-14 DIAGNOSIS — I2510 Atherosclerotic heart disease of native coronary artery without angina pectoris: Secondary | ICD-10-CM

## 2014-11-14 DIAGNOSIS — I739 Peripheral vascular disease, unspecified: Secondary | ICD-10-CM

## 2014-11-14 DIAGNOSIS — E785 Hyperlipidemia, unspecified: Secondary | ICD-10-CM

## 2014-11-14 DIAGNOSIS — I10 Essential (primary) hypertension: Secondary | ICD-10-CM

## 2014-11-14 LAB — LIPID PANEL
CHOL/HDL RATIO: 4
CHOLESTEROL: 172 mg/dL (ref 0–200)
HDL: 42.2 mg/dL (ref >39.00)
LDL CALC: 103 mg/dL — AB (ref 0–99)
NONHDL: 129.8
Triglycerides: 132 mg/dL (ref 0.0–149.0)
VLDL: 26.4 mg/dL (ref 0.0–40.0)

## 2014-11-14 LAB — BASIC METABOLIC PANEL
BUN: 29 mg/dL — AB (ref 6–23)
CO2: 28 meq/L (ref 19–32)
CREATININE: 1.8 mg/dL — AB (ref 0.4–1.5)
Calcium: 9.4 mg/dL (ref 8.4–10.5)
Chloride: 105 mEq/L (ref 96–112)
GFR: 39.65 mL/min — ABNORMAL LOW (ref >60.00)
Glucose, Bld: 125 mg/dL — ABNORMAL HIGH (ref 70–99)
POTASSIUM: 3.9 meq/L (ref 3.5–5.1)
Sodium: 139 mEq/L (ref 135–145)

## 2014-11-14 NOTE — Patient Instructions (Signed)
Your physician recommends that you have lab work today--Lipid profile/BMET.  Your physician recommends that you schedule a follow-up appointment in: 3 months with Dr [name].

## 2014-11-16 ENCOUNTER — Encounter: Payer: Self-pay | Admitting: Cardiology

## 2014-11-16 NOTE — Progress Notes (Signed)
Patient ID: SCIPIO PRESSNALL, male   DOB: 1928/09/25, 80 y.o.   MRN: [id number] PCP: Dr. Ronnald Ramp  80 yo with history of CKD, CAD s/p CABG, and ischemic cardiomyopathy presents for cardiology followup.  He has stable leg pain. He has thigh and calf pain in both legs after walking about 2 blocks.  This resolves with rest.  He has a history of PAD followed at [facility].    Over the last few months, he had developed exertional chest pressure (with heavier exertion like stairs).  Pain would resolve with rest and never occurred at rest. I took him for [facility] in 11/15. 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  He had had occlusion of a relatively small LAD, there were right to left collaterals and no intervention was done.  I started him on Imdur.  Since starting Imdur, he has had no further chest pain.  He also does not get exertional dyspnea.  As above, his main problem remains claudication.    Labs (12/14): K 4.8, creatinine 1.5 Labs (5/15): LDL 92 Labs (11/15): K 4.1, creatinine 1.5  ECG: NSR, 1st degree AV block, blocked PAC, LAFB  PMH: 1. CKD 2. HTN 3. Hyperlipidemia 4. AAA: s/p resection with left renal artery bypass in 1997 5. GERD 6. IBS 7. Melanoma s/p reception 8. Diabetes: Diet-controlled 9. Carotid stenosis: Carotid dopplers (4/15) with 40-59% bilateral stenosis.  10. PAD: 9/14 ABIs 0.91 right 1.09 left.  11. CAD: Has super-dominant RCA. s/p CABG 1992.  He had Taxus DES to mid PDA in 5/02. LHC (1/05) with 90% ostial PDA (had DES to this vessel), totally occluded RIMA-PDA, totally occluded PLV, sequential SVG-PLV with 1 branch occluded.  LHC (11/15) with left main off the right cusp, 40-50% ostial left main, total occlusion of the proximal LAD (small vessel) with right to left collaterals, large super-dominant RCA with 50% ISR mid RCA, 50% ostial PDA, patent SVG-PLV, RIMA-PAD known atretic.  12. Ischemic cardiomyopathy: Echo (12/09) with EF 45-50%, basal to mid inferolateral hypokinesis.  Echo (6/15) with EF 45-50%, akinesis of the mid to apical inferolateral wall.   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  FH: Brother with CAD.   ROS: All systems reviewed and negative except as per HPI.   Current Outpatient Prescriptions  Medication Sig Dispense Refill  . ALPRAZolam (XANAX) 0.5 MG tablet Take 0.25 mg by mouth 2 (two) times daily as needed for anxiety.    Marland Kitchen aspirin EC 81 MG tablet Take 1 tablet (81 mg total) by mouth daily.    Marland Kitchen atorvastatin (LIPITOR) 80 MG tablet Take 80 mg by mouth daily.    . cilostazol (PLETAL) 100 MG tablet Take 100 mg by mouth 2 (two) times daily.    . clopidogrel (PLAVIX) 75 MG tablet Take 75 mg by mouth daily.    Marland Kitchen erythromycin with ethanol (THERAMYCIN) 2 % external solution Apply 1 application topically daily as needed (for skin irritation around neck).    . ezetimibe (ZETIA) 10 MG tablet Take 10 mg by mouth daily.    . fenofibrate (TRICOR) 145 MG tablet Take 145 mg by mouth daily.    Marland Kitchen FLUoxetine (PROZAC) 10 MG capsule Take 10 mg by mouth daily.    Marland Kitchen FLUZONE HIGH-DOSE 0.5 ML SUSY     . furosemide (LASIX) 40 MG tablet Take 40 mg by mouth every morning.    Marland Kitchen glucosamine-chondroitin 500-400 MG tablet Take 1 tablet by mouth daily.     Marland Kitchen  Hypromellose (GENTEAL MILD) 0.2 % SOLN Place 1 drop into both eyes daily as needed (for dry eyes).     . isosorbide mononitrate (IMDUR) 30 MG 24 hr tablet     . losartan (COZAAR) 100 MG tablet Take 100 mg by mouth daily.    . meloxicam (MOBIC) 7.5 MG tablet Take 7.5 mg by mouth daily.    . Misc Natural Products (NF FORMULAS TESTOSTERONE) CAPS Take 1 capsule by mouth 2 (two) times daily.    . Multiple Vitamin (MULTIVITAMIN) tablet Take 1 tablet by mouth daily.    Marland Kitchen NIFEdipine (PROCARDIA-XL/ADALAT-CC/NIFEDICAL-XL) 30 MG 24 hr tablet Take 30 mg by mouth daily.    . nitroGLYCERIN (NITROSTAT) 0.4 MG SL tablet Place 0.4 mg under the tongue every 5 (five) minutes as needed  for chest pain.    Marland Kitchen omega-3 acid ethyl esters (LOVAZA) 1 G capsule Take 1 g by mouth 2 (two) times daily.    . potassium gluconate 595 MG TABS Take 595 mg by mouth daily.    . tamsulosin (FLOMAX) 0.4 MG CAPS capsule Take 0.4 mg by mouth daily after supper.    . temazepam (RESTORIL) 30 MG capsule Take 30 mg by mouth at bedtime as needed for sleep.     No current facility-administered medications for this visit.     BP 118/70 mmHg  Pulse 76  Ht 5\' 7"  (1.702 m)  Wt 188 lb (85.276 kg)  BMI 29.44 kg/m2 General: NAD Neck: No JVD, no thyromegaly or thyroid nodule.  Lungs: Clear to auscultation bilaterally with normal respiratory effort. CV: Nondisplaced PMI.  Heart regular S1/S2, no S3/S4, no murmur.  Trace ankle edema.  Right carotid bruit.  Normal pedal pulses.  Abdomen: Soft, nontender, no hepatosplenomegaly, no distention.  Skin: Intact without lesions or rashes.  Neurologic: Alert and oriented x 3.  Psych: Normal affect. Extremities: No clubbing or cyanosis.   Assessment/Plan:  1. CAD: s/p CABG.  Recent cath showed occluded LAD with left to right collaterals.  The LAD was a relatively small vessel (super-dominant right).  I managed him medically and started Imdur, which has resolved his anginal pain.  - Continue ASA 81 + Plavix.  - Continue statin, ARB, Imdur.  2. Ischemic cardiomyopathy: EF 45-50% on last echo in 6/15, stable compared to the past.  He appears euvolemic, no significant dyspnea.   3. Carotid stenosis: Repeat carotid dopplers 4/16.  4. PAD: Followed at [facility].  Stable claudication.  He is on cilostazol.  5. Hyperlipidemia: LDL 92 in 5/15. I will recheck lipids today. If LDL still, high, will change statin over to Crestor.  6. CKD: Check BMET today.   Loralie Champagne 11/16/2014 10:00 PM

## 2014-11-18 ENCOUNTER — Other Ambulatory Visit: Payer: Self-pay

## 2014-11-18 ENCOUNTER — Telehealth: Payer: Self-pay | Admitting: Cardiology

## 2014-11-18 DIAGNOSIS — E785 Hyperlipidemia, unspecified: Secondary | ICD-10-CM

## 2014-11-18 DIAGNOSIS — I10 Essential (primary) hypertension: Secondary | ICD-10-CM

## 2014-11-18 NOTE — Telephone Encounter (Signed)
The pts appointments have been clarified and he verbalized understanding.

## 2014-11-18 NOTE — Telephone Encounter (Signed)
New message     Patient calling stating he was suppose to be having lab work & office visit with Dr. [name] .     Read the last office visit AVS to patient regarding lab work drawn today . To be seen in  3 months.     Patient is asking for clarification on this.

## 2014-11-28 ENCOUNTER — Other Ambulatory Visit: Payer: Self-pay

## 2014-11-28 MED ORDER — ATORVASTATIN CALCIUM 80 MG PO TABS: 80.0000 mg | ORAL_TABLET | Freq: Every day | ORAL | Status: DC

## 2014-12-01 ENCOUNTER — Other Ambulatory Visit: Payer: Medicare PPO

## 2014-12-01 DIAGNOSIS — I10 Essential (primary) hypertension: Secondary | ICD-10-CM

## 2014-12-02 ENCOUNTER — Other Ambulatory Visit (INDEPENDENT_AMBULATORY_CARE_PROVIDER_SITE_OTHER): Payer: Medicare PPO | Admitting: *Deleted

## 2014-12-02 DIAGNOSIS — E785 Hyperlipidemia, unspecified: Secondary | ICD-10-CM

## 2014-12-02 LAB — HEPATIC FUNCTION PANEL
ALT: 22 U/L (ref 0–53)
AST: 27 U/L (ref 0–37)
Albumin: 4.1 g/dL (ref 3.5–5.2)
Alkaline Phosphatase: 19 U/L — ABNORMAL LOW (ref 39–117)
BILIRUBIN DIRECT: 0 mg/dL (ref 0.0–0.3)
BILIRUBIN TOTAL: 0.7 mg/dL (ref 0.2–1.2)
Total Protein: 6.9 g/dL (ref 6.0–8.3)

## 2014-12-02 LAB — LIPID PANEL
CHOLESTEROL: 177 mg/dL (ref 0–200)
HDL: 47.3 mg/dL (ref >39.00)
LDL CALC: 116 mg/dL — AB (ref 0–99)
NonHDL: 129.7
TRIGLYCERIDES: 71 mg/dL (ref 0.0–149.0)
Total CHOL/HDL Ratio: 4
VLDL: 14.2 mg/dL (ref 0.0–40.0)

## 2014-12-03 ENCOUNTER — Other Ambulatory Visit (INDEPENDENT_AMBULATORY_CARE_PROVIDER_SITE_OTHER): Payer: Medicare PPO

## 2014-12-03 DIAGNOSIS — E869 Volume depletion, unspecified: Secondary | ICD-10-CM

## 2014-12-03 LAB — BASIC METABOLIC PANEL
BUN: 24 mg/dL — ABNORMAL HIGH (ref 6–23)
CO2: 25 mEq/L (ref 19–32)
CREATININE: 1.5 mg/dL (ref 0.4–1.5)
Calcium: 9.2 mg/dL (ref 8.4–10.5)
Chloride: 103 mEq/L (ref 96–112)
GFR: 45.95 mL/min — ABNORMAL LOW (ref >60.00)
GLUCOSE: 98 mg/dL (ref 70–99)
POTASSIUM: 4 meq/L (ref 3.5–5.1)
Sodium: 138 mEq/L (ref 135–145)

## 2014-12-04 ENCOUNTER — Encounter (HOSPITAL_COMMUNITY): Payer: Self-pay | Admitting: Cardiology

## 2014-12-09 ENCOUNTER — Telehealth: Payer: Self-pay | Admitting: General Practice

## 2014-12-09 NOTE — Telephone Encounter (Signed)
ERROR

## 2014-12-09 NOTE — Telephone Encounter (Signed)
NEW MESSAGE        Patient returning call from yesterday regarding lab work, please call patient.

## 2014-12-09 NOTE — Telephone Encounter (Signed)
Lm w/wife for him to call back 

## 2014-12-09 NOTE — Telephone Encounter (Signed)
Notified of lab results. 

## 2014-12-11 ENCOUNTER — Encounter: Payer: Self-pay | Admitting: Podiatrist

## 2014-12-11 ENCOUNTER — Ambulatory Visit (INDEPENDENT_AMBULATORY_CARE_PROVIDER_SITE_OTHER): Payer: Medicare PPO | Admitting: Podiatrist

## 2014-12-11 DIAGNOSIS — M79676 Pain in unspecified toe(s): Secondary | ICD-10-CM

## 2014-12-11 DIAGNOSIS — B351 Tinea unguium: Secondary | ICD-10-CM

## 2014-12-15 ENCOUNTER — Encounter: Payer: Self-pay | Admitting: Cardiology

## 2014-12-15 NOTE — Progress Notes (Signed)
HPI: Patient presents today for follow up of foot and nail care. Denies any new complaints today.  Objective: Patients chart is reviewed. Vascular status reveals pedal pulses noted at 2 out of 4 dp and pt bilateral . Neurological sensation is Normal to Semmes Weinstein monofilament bilateral. Patients nails are thickened, discolored, distrophic, friable and brittle with yellow-brown discoloration. Patient subjectively relates they are painful with shoes and with ambulation of bilateral feet.  Assessment: Symptomatic onychomycosis  Plan: Discussed treatment options and alternatives. The symptomatic toenails were debrided through manual an mechanical means without complication. Return appointment recommended at routine intervals of 3 months   

## 2014-12-17 ENCOUNTER — Other Ambulatory Visit: Payer: Self-pay | Admitting: Internal Medicine

## 2015-01-19 ENCOUNTER — Other Ambulatory Visit (INDEPENDENT_AMBULATORY_CARE_PROVIDER_SITE_OTHER): Payer: Medicare PPO | Admitting: *Deleted

## 2015-01-19 DIAGNOSIS — E785 Hyperlipidemia, unspecified: Secondary | ICD-10-CM

## 2015-01-19 LAB — LIPID PANEL
CHOLESTEROL: 110 mg/dL (ref 0–200)
HDL: 47.2 mg/dL (ref >39.00)
LDL Cholesterol: 51 mg/dL (ref 0–99)
NonHDL: 62.8
Total CHOL/HDL Ratio: 2
Triglycerides: 57 mg/dL (ref 0.0–149.0)
VLDL: 11.4 mg/dL (ref 0.0–40.0)

## 2015-01-19 LAB — HEPATIC FUNCTION PANEL
ALT: 21 U/L (ref 0–53)
AST: 26 U/L (ref 0–37)
Albumin: 4.2 g/dL (ref 3.5–5.2)
Alkaline Phosphatase: 21 U/L — ABNORMAL LOW (ref 39–117)
Bilirubin, Direct: 0.2 mg/dL (ref 0.0–0.3)
Total Bilirubin: 0.6 mg/dL (ref 0.2–1.2)
Total Protein: 6.9 g/dL (ref 6.0–8.3)

## 2015-01-19 NOTE — Addendum Note (Signed)
Addended by: Eulis Foster on: 01/19/2015 10:35 AM   Modules accepted: Orders

## 2015-01-19 NOTE — Addendum Note (Signed)
Addended by: Eulis Foster on: 01/19/2015 10:36 AM   Modules accepted: Orders

## 2015-01-20 ENCOUNTER — Telehealth: Payer: Self-pay | Admitting: Cardiology

## 2015-01-20 ENCOUNTER — Other Ambulatory Visit: Payer: Self-pay | Admitting: Internal Medicine

## 2015-01-20 NOTE — Telephone Encounter (Signed)
New Msg ° ° ° ° ° ° °Pt returning call. ° ° °Please call back. °

## 2015-01-20 NOTE — Telephone Encounter (Signed)
Pt is aware of results. 

## 2015-02-13 ENCOUNTER — Encounter: Payer: Self-pay | Admitting: Cardiology

## 2015-02-13 ENCOUNTER — Ambulatory Visit (INDEPENDENT_AMBULATORY_CARE_PROVIDER_SITE_OTHER): Payer: Medicare PPO | Admitting: Cardiology

## 2015-02-13 ENCOUNTER — Encounter: Payer: Self-pay | Admitting: *Deleted

## 2015-02-13 VITALS — BP 136/60 | HR 103 | Ht 67.0 in | Wt 191.8 lb

## 2015-02-13 DIAGNOSIS — I739 Peripheral vascular disease, unspecified: Secondary | ICD-10-CM

## 2015-02-13 DIAGNOSIS — I6523 Occlusion and stenosis of bilateral carotid arteries: Secondary | ICD-10-CM

## 2015-02-13 DIAGNOSIS — I2510 Atherosclerotic heart disease of native coronary artery without angina pectoris: Secondary | ICD-10-CM

## 2015-02-13 LAB — BASIC METABOLIC PANEL
BUN: 32 mg/dL — ABNORMAL HIGH (ref 6–23)
CALCIUM: 9.5 mg/dL (ref 8.4–10.5)
CO2: 31 mEq/L (ref 19–32)
CREATININE: 1.62 mg/dL — AB (ref 0.40–1.50)
Chloride: 105 mEq/L (ref 96–112)
GFR: 43.32 mL/min — ABNORMAL LOW (ref >60.00)
GLUCOSE: 126 mg/dL — AB (ref 70–99)
Potassium: 3.9 mEq/L (ref 3.5–5.1)
Sodium: 141 mEq/L (ref 135–145)

## 2015-02-13 NOTE — Patient Instructions (Signed)
Your physician recommends that you have  lab work today--BMET  Your physician has requested that you have a carotid duplex. This test is an ultrasound of the carotid arteries in your neck. It looks at blood flow through these arteries that supply the brain with blood. Allow one hour for this exam. There are no restrictions or special instructions. April 2016  Your physician recommends that you schedule a follow-up appointment in: 4 months with Dr [name].

## 2015-02-15 NOTE — Progress Notes (Addendum)
Patient ID: Andrew [name], male   DOB: September 07, 1928, 81 y.o.   MRN: [id number] PPCP: Dr. Ronnald Ramp  81 yo with history of CKD, CAD s/p CABG, and ischemic cardiomyopathy presents for cardiology followup.  He has stable leg pain. He has thigh and calf pain in both legs after walking about 4-5 blocks.  This resolves with rest.  He has a history of PAD followed at [facility].    I took him for [facility] in 11/15. 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  He had had occlusion of a relatively small LAD, there were right to left collaterals and no intervention was done.  I started him on Imdur.  Since starting Imdur, he has had no further chest pain.  He also does not get exertional dyspnea.  As above, his main problem remains claudication.    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  He has been taking daily meloxicam.  Labs (12/14): K 4.8, creatinine 1.5 Labs (5/15): LDL 92 Labs (11/15): K 4.1, creatinine 1.5 Labs (12/15): K 4, creatinine 1.5 Labs (1/16): LFTs normal, LDL 51, HDL 47  PMH: 1. CKD 2. HTN 3. Hyperlipidemia 4. AAA: s/p resection with left renal artery bypass in 1997 5. GERD 6. IBS 7. Melanoma s/p reception 8. Diabetes: Diet-controlled 9. Carotid stenosis: Carotid dopplers (4/15) with 40-59% bilateral stenosis.  10. PAD: 9/14 ABIs 0.91 right 1.09 left.  11. CAD: Has super-dominant RCA. s/p CABG 1992.  He had Taxus DES to mid PDA in 5/02. LHC (1/05) with 90% ostial PDA (had DES to this vessel), totally occluded RIMA-PDA, totally occluded PLV, sequential SVG-PLV with 1 branch occluded.  LHC (11/15) with left main off the right cusp, 40-50% ostial left main, total occlusion of the proximal LAD (small vessel) with right to left collaterals, large super-dominant RCA with 50% ISR mid RCA, 50% ostial PDA, patent SVG-PLV, RIMA-PAD known atretic.  12. Ischemic cardiomyopathy: Echo (12/09) with EF 45-50%, basal to mid inferolateral hypokinesis. Echo (6/15)  with EF 45-50%, akinesis of the mid to apical inferolateral wall.  13. 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  FH: Brother with CAD.   ROS: All systems reviewed and negative except as per HPI.   Current Outpatient Prescriptions  Medication Sig Dispense Refill  . ALPRAZolam (XANAX) 0.5 MG tablet Take 0.25 mg by mouth 2 (two) times daily as needed for anxiety.    Marland Kitchen aspirin EC 81 MG tablet Take 1 tablet (81 mg total) by mouth daily.    Marland Kitchen atorvastatin (LIPITOR) 80 MG tablet Take 1 tablet (80 mg total) by mouth daily. 30 tablet 6  . cilostazol (PLETAL) 100 MG tablet Take 100 mg by mouth 2 (two) times daily.    . clopidogrel (PLAVIX) 75 MG tablet Take 75 mg by mouth daily.    Marland Kitchen erythromycin with ethanol (THERAMYCIN) 2 % external solution Apply 1 application topically daily as needed (for skin irritation around neck).    . ezetimibe (ZETIA) 10 MG tablet Take 10 mg by mouth daily.    . fenofibrate (TRICOR) 145 MG tablet Take 145 mg by mouth daily.    Marland Kitchen FLUoxetine (PROZAC) 10 MG capsule Take 10 mg by mouth daily.    Marland Kitchen FLUZONE HIGH-DOSE 0.5 ML SUSY     . furosemide (LASIX) 40 MG tablet Take 40 mg by mouth every morning.    Marland Kitchen glucosamine-chondroitin 500-400 MG tablet Take  1 tablet by mouth daily.     . Hypromellose (GENTEAL MILD) 0.2 % SOLN Place 1 drop into both eyes daily as needed (for dry eyes).     . isosorbide mononitrate (IMDUR) 30 MG 24 hr tablet Take 30 mg by mouth daily.     Marland Kitchen losartan (COZAAR) 100 MG tablet TAKE 1 TABLET ONCE DAILY. 90 tablet 3  . meloxicam (MOBIC) 7.5 MG tablet TAKE 1 TABLET ONCE DAILY. 90 tablet 3  . Misc Natural Products (NF FORMULAS TESTOSTERONE) CAPS Take 1 capsule by mouth 2 (two) times daily.    . Multiple Vitamin (MULTIVITAMIN) tablet Take 1 tablet by mouth daily.    Marland Kitchen NIFEdipine (PROCARDIA-XL/ADALAT-CC/NIFEDICAL-XL) 30 MG 24 hr tablet Take 30 mg by mouth daily.    . nitroGLYCERIN (NITROSTAT) 0.4 MG SL  tablet Place 0.4 mg under the tongue every 5 (five) minutes as needed for chest pain.    Marland Kitchen omega-3 acid ethyl esters (LOVAZA) 1 G capsule Take 1 g by mouth 2 (two) times daily.    . potassium gluconate 595 MG TABS Take 595 mg by mouth daily.    . tamsulosin (FLOMAX) 0.4 MG CAPS capsule Take 0.4 mg by mouth daily after supper.    . temazepam (RESTORIL) 30 MG capsule Take 30 mg by mouth at bedtime as needed for sleep.     No current facility-administered medications for this visit.     BP 136/60 mmHg  Pulse 103  Ht 5\' 7"  (1.702 m)  Wt 191 lb 12.8 oz (87 kg)  BMI 30.03 kg/m2  SpO2 97% General: NAD Neck: No JVD, no thyromegaly or thyroid nodule.  Lungs: Clear to auscultation bilaterally with normal respiratory effort. CV: Nondisplaced PMI.  Heart regular S1/S2, no S3/S4, 2/6 early SEM RUSB.  1+ bilateral ankle edema.  Right carotid bruit.  Difficult to palpate pedal pulses.  Abdomen: Soft, nontender, no hepatosplenomegaly, no distention.  Skin: Intact without lesions or rashes.  Neurologic: Alert and oriented x 3.  Psych: Normal affect. Extremities: No clubbing or cyanosis.   Assessment/Plan:  1. CAD: s/p CABG.  Recent cath showed occluded LAD with left to right collaterals.  The LAD was a relatively small vessel (super-dominant right).  I managed him medically and started Imdur, which has resolved his anginal pain.  - Continue ASA 81 + Plavix.  - Continue statin, ARB, Imdur.  2. Ischemic cardiomyopathy: EF 45-50% on last echo in 6/15, stable compared to the past.  He appears euvolemic, no significant dyspnea.   3. Carotid stenosis: Repeat carotid dopplers 4/16.  4. PAD: Followed at [facility].  Claudication seems a bit improved.  He is on cilostazol.  5. Hyperlipidemia: Good lipids in 1/16.  6. CKD: Stable creatinine. He has been using meloxicam daily.  I asked him to try to decrease use of meloxicam and to use Tylenol preferentially.    Followup in 4 months.    Loralie Champagne 02/15/2015

## 2015-02-20 ENCOUNTER — Other Ambulatory Visit: Payer: Self-pay | Admitting: Internal Medicine

## 2015-03-05 ENCOUNTER — Telehealth: Payer: Self-pay | Admitting: Internal Medicine

## 2015-03-05 DIAGNOSIS — F341 Dysthymic disorder: Secondary | ICD-10-CM

## 2015-03-05 MED ORDER — FLUOXETINE HCL 20 MG PO CAPS: 20.0000 mg | ORAL_CAPSULE | Freq: Every day | ORAL | Status: DC

## 2015-03-05 NOTE — Telephone Encounter (Signed)
Pt called stated that he need to up the dosage on FLUoxetine (PROZAC) 10 MG, pt stated he feel a little bit of anxiety.

## 2015-03-05 NOTE — Telephone Encounter (Signed)
Dose changed Please tell him that he is way overdue for an appt with me

## 2015-03-05 NOTE — Telephone Encounter (Signed)
Pt aware of the medication sent in and to make an appt with Dr. Ronnald Ramp

## 2015-03-19 ENCOUNTER — Encounter: Payer: Self-pay | Admitting: Podiatrist

## 2015-03-19 ENCOUNTER — Ambulatory Visit (INDEPENDENT_AMBULATORY_CARE_PROVIDER_SITE_OTHER): Payer: Medicare PPO | Admitting: Podiatrist

## 2015-03-19 DIAGNOSIS — M79676 Pain in unspecified toe(s): Secondary | ICD-10-CM

## 2015-03-19 DIAGNOSIS — B351 Tinea unguium: Secondary | ICD-10-CM

## 2015-03-23 ENCOUNTER — Encounter: Payer: Self-pay | Admitting: Internal Medicine

## 2015-03-23 ENCOUNTER — Other Ambulatory Visit (INDEPENDENT_AMBULATORY_CARE_PROVIDER_SITE_OTHER): Payer: Medicare PPO

## 2015-03-23 ENCOUNTER — Ambulatory Visit (INDEPENDENT_AMBULATORY_CARE_PROVIDER_SITE_OTHER): Payer: Medicare PPO | Admitting: Internal Medicine

## 2015-03-23 VITALS — BP 102/56 | HR 89 | Temp 98.5°F | Resp 16 | Wt 187.0 lb

## 2015-03-23 DIAGNOSIS — D696 Thrombocytopenia, unspecified: Secondary | ICD-10-CM | POA: Diagnosis not present

## 2015-03-23 DIAGNOSIS — Z0000 Encounter for general adult medical examination without abnormal findings: Secondary | ICD-10-CM

## 2015-03-23 DIAGNOSIS — E785 Hyperlipidemia, unspecified: Secondary | ICD-10-CM

## 2015-03-23 DIAGNOSIS — I10 Essential (primary) hypertension: Secondary | ICD-10-CM

## 2015-03-23 DIAGNOSIS — N189 Chronic kidney disease, unspecified: Secondary | ICD-10-CM

## 2015-03-23 DIAGNOSIS — E1122 Type 2 diabetes mellitus with diabetic chronic kidney disease: Secondary | ICD-10-CM

## 2015-03-23 DIAGNOSIS — N182 Chronic kidney disease, stage 2 (mild): Secondary | ICD-10-CM

## 2015-03-23 DIAGNOSIS — Z23 Encounter for immunization: Secondary | ICD-10-CM

## 2015-03-23 DIAGNOSIS — F341 Dysthymic disorder: Secondary | ICD-10-CM

## 2015-03-23 LAB — COMPREHENSIVE METABOLIC PANEL
ALT: 22 U/L (ref 0–53)
AST: 24 U/L (ref 0–37)
Albumin: 4 g/dL (ref 3.5–5.2)
Alkaline Phosphatase: 19 U/L — ABNORMAL LOW (ref 39–117)
BILIRUBIN TOTAL: 0.5 mg/dL (ref 0.2–1.2)
BUN: 32 mg/dL — AB (ref 6–23)
CO2: 30 mEq/L (ref 19–32)
Calcium: 9.6 mg/dL (ref 8.4–10.5)
Chloride: 106 mEq/L (ref 96–112)
Creatinine, Ser: 1.73 mg/dL — ABNORMAL HIGH (ref 0.40–1.50)
GFR: 40.15 mL/min — ABNORMAL LOW (ref >60.00)
GLUCOSE: 101 mg/dL — AB (ref 70–99)
Potassium: 4 mEq/L (ref 3.5–5.1)
Sodium: 139 mEq/L (ref 135–145)
TOTAL PROTEIN: 6.8 g/dL (ref 6.0–8.3)

## 2015-03-23 LAB — CBC WITH DIFFERENTIAL/PLATELET
BASOS ABS: 0 10*3/uL (ref 0.0–0.1)
Basophils Relative: 0.4 % (ref 0.0–3.0)
Eosinophils Absolute: 0.1 10*3/uL (ref 0.0–0.7)
Eosinophils Relative: 1.4 % (ref 0.0–5.0)
HEMATOCRIT: 32.3 % — AB (ref 39.0–52.0)
HEMOGLOBIN: 11.1 g/dL — AB (ref 13.0–17.0)
Lymphocytes Relative: 18.5 % (ref 12.0–46.0)
Lymphs Abs: 1.2 10*3/uL (ref 0.7–4.0)
MCHC: 34.3 g/dL (ref 30.0–36.0)
MCV: 96.3 fl (ref 78.0–100.0)
MONOS PCT: 8.7 % (ref 3.0–12.0)
Monocytes Absolute: 0.5 10*3/uL (ref 0.1–1.0)
NEUTROS ABS: 4.5 10*3/uL (ref 1.4–7.7)
Neutrophils Relative %: 71 % (ref 43.0–77.0)
Platelets: 179 10*3/uL (ref 150.0–400.0)
RBC: 3.35 Mil/uL — ABNORMAL LOW (ref 4.22–5.81)
RDW: 13.8 % (ref 11.5–15.5)
WBC: 6.3 10*3/uL (ref 4.0–10.5)

## 2015-03-23 LAB — URINALYSIS, ROUTINE W REFLEX MICROSCOPIC
Bilirubin Urine: NEGATIVE
HGB URINE DIPSTICK: NEGATIVE
Ketones, ur: NEGATIVE
Leukocytes, UA: NEGATIVE
NITRITE: NEGATIVE
Specific Gravity, Urine: 1.015 (ref 1.000–1.030)
UROBILINOGEN UA: 0.2 (ref 0.0–1.0)
Urine Glucose: NEGATIVE
pH: 5.5 (ref 5.0–8.0)

## 2015-03-23 LAB — MICROALBUMIN / CREATININE URINE RATIO
CREATININE, U: 121.4 mg/dL
MICROALB/CREAT RATIO: 10.9 mg/g (ref 0.0–30.0)
Microalb, Ur: 13.2 mg/dL — ABNORMAL HIGH (ref 0.0–1.9)

## 2015-03-23 LAB — TSH: TSH: 2.34 u[IU]/mL (ref 0.35–4.50)

## 2015-03-23 LAB — HEMOGLOBIN A1C: Hgb A1c MFr Bld: 6.4 % (ref 4.6–6.5)

## 2015-03-23 NOTE — Progress Notes (Signed)
Pre visit review using our clinic review tool, if applicable. No additional management support is needed unless otherwise documented below in the visit note. 

## 2015-03-23 NOTE — Progress Notes (Signed)
Subjective:    Patient ID: Andrew [name], male    DOB: Sep 01, 1928, 81 y.o.   MRN: [id number]  Diabetes Pertinent negatives for hypoglycemia include no dizziness, headaches, speech difficulty or sweats. Pertinent negatives for diabetes include no blurred vision, no chest pain and no fatigue. Diabetic complications include PVD.  Hyperlipidemia Pertinent negatives include no chest pain, myalgias or shortness of breath.  Hypertension This is a chronic problem. The current episode started more than 1 year ago. The problem is unchanged. The problem is controlled. Associated symptoms include anxiety. Pertinent negatives include no blurred vision, chest pain, headaches, malaise/fatigue, neck pain, orthopnea, palpitations, peripheral edema, PND, shortness of breath or sweats. Agents associated with hypertension include NSAIDs. Past treatments include diuretics, calcium channel blockers and angiotensin blockers. The current treatment provides moderate improvement. There are no compliance problems.  Hypertensive end-organ damage includes heart failure and PVD.      Review of Systems  Constitutional: Negative.  Negative for fever, chills, malaise/fatigue, diaphoresis, appetite change and fatigue.  HENT: Negative.   Eyes: Negative.  Negative for blurred vision.  Respiratory: Negative.  Negative for cough, choking, chest tightness, shortness of breath and stridor.   Cardiovascular: Negative.  Negative for chest pain, palpitations, orthopnea, leg swelling and PND.  Gastrointestinal: Negative.  Negative for nausea, vomiting, abdominal pain, diarrhea and blood in stool.  Endocrine: Negative.   Genitourinary: Negative.   Musculoskeletal: Positive for arthralgias. Negative for myalgias, back pain, gait problem and neck pain.  Skin: Negative.  Negative for rash.  Allergic/Immunologic: Negative.   Neurological: Negative.  Negative for dizziness, syncope, speech difficulty, light-headedness, numbness and  headaches.  Hematological: Negative.  Negative for adenopathy. Does not bruise/bleed easily.  Psychiatric/Behavioral: Negative.        Objective:   Physical Exam  Constitutional: He is oriented to person, place, and time. He appears well-developed and well-nourished. No distress.  HENT:  Head: Normocephalic and atraumatic.  Mouth/Throat: Oropharynx is clear and moist. No oropharyngeal exudate.  Eyes: Conjunctivae are normal. Right eye exhibits no discharge. Left eye exhibits no discharge. No scleral icterus.  Neck: Normal range of motion. Neck supple. No JVD present. No tracheal deviation present. No thyromegaly present.  Cardiovascular: Normal rate, regular rhythm, normal heart sounds and intact distal pulses.  Exam reveals no gallop and no friction rub.   No murmur heard. Pulmonary/Chest: Effort normal and breath sounds normal. No stridor. No respiratory distress. He has no wheezes. He has no rales. He exhibits no tenderness.  Abdominal: Soft. Bowel sounds are normal. He exhibits no distension and no mass. There is no tenderness. There is no rebound and no guarding.  Musculoskeletal: Normal range of motion. He exhibits no edema or tenderness.  Lymphadenopathy:    He has no cervical adenopathy.  Neurological: He is oriented to person, place, and time.  Skin: Skin is warm and dry. No rash noted. He is not diaphoretic. No erythema. No pallor.  Psychiatric: He has a normal mood and affect. His behavior is normal. Judgment and thought content normal.  Vitals reviewed.     Lab Results  Component Value Date   WBC 5.7 10/28/2014   HGB 12.6* 10/28/2014   HCT 38.1* 10/28/2014   PLT 175.0 10/28/2014   GLUCOSE 126* 02/13/2015   CHOL 110 01/19/2015   TRIG 57.0 01/19/2015   HDL 47.20 01/19/2015   LDLDIRECT 78.6 03/18/2010   LDLCALC 51 01/19/2015   ALT 21 01/19/2015   AST 26 01/19/2015   NA 141 02/13/2015  K 3.9 02/13/2015   CL 105 02/13/2015   CREATININE 1.62* 02/13/2015   BUN 32*  02/13/2015   CO2 31 02/13/2015   TSH 1.416 03/28/2013   PSA 2.79 01/23/2013   INR 1.0 10/28/2014   HGBA1C 6.1 07/21/2014   MICROALBUR 45.5* 03/18/2010      Assessment & Plan:

## 2015-03-23 NOTE — Patient Instructions (Signed)

## 2015-03-24 ENCOUNTER — Encounter: Payer: Self-pay | Admitting: Internal Medicine

## 2015-03-24 DIAGNOSIS — Z0000 Encounter for general adult medical examination without abnormal findings: Secondary | ICD-10-CM | POA: Insufficient documentation

## 2015-03-24 NOTE — Assessment & Plan Note (Signed)
His renal function is stable He is not willing to start taking nsaids Will control the blood pressure and blood sugar Will follow

## 2015-03-24 NOTE — Assessment & Plan Note (Signed)

## 2015-03-24 NOTE — Assessment & Plan Note (Signed)
His BP is well controlled Lytes and renal function are stable 

## 2015-03-24 NOTE — Assessment & Plan Note (Signed)
He has achieved his LDL goal 

## 2015-03-24 NOTE — Assessment & Plan Note (Signed)
This is in remission with prozac

## 2015-03-24 NOTE — Assessment & Plan Note (Signed)
There are no signs of bleeding or bruising The PLT ct is normal now - will follow

## 2015-03-28 NOTE — Progress Notes (Signed)
HPI: Patient presents today for follow up of foot and nail care. Denies any new complaints today.  Objective: Patients chart is reviewed. Vascular status reveals pedal pulses noted at 2 out of 4 dp and pt bilateral . Neurological sensation is Normal to Semmes Weinstein monofilament bilateral. Patients nails are thickened, discolored, distrophic, friable and brittle with yellow-brown discoloration. Patient subjectively relates they are painful with shoes and with ambulation of bilateral feet.  Assessment: Symptomatic onychomycosis  Plan: Discussed treatment options and alternatives. The symptomatic toenails were debrided through manual an mechanical means without complication. Return appointment recommended at routine intervals of 3 months   

## 2015-04-01 ENCOUNTER — Other Ambulatory Visit: Payer: Self-pay | Admitting: Internal Medicine

## 2015-04-16 ENCOUNTER — Telehealth: Payer: Self-pay

## 2015-04-16 MED ORDER — ALPRAZOLAM 0.5 MG PO TABS: 0.2500 mg | ORAL_TABLET | Freq: Two times a day (BID) | ORAL | Status: DC | PRN

## 2015-04-16 NOTE — Telephone Encounter (Signed)
ok 

## 2015-04-16 NOTE — Telephone Encounter (Signed)
Request for alprazolam 0.5 mg refill sent to [facility]

## 2015-04-24 ENCOUNTER — Other Ambulatory Visit: Payer: Self-pay | Admitting: Internal Medicine

## 2015-04-29 ENCOUNTER — Telehealth: Payer: Self-pay

## 2015-04-29 NOTE — Telephone Encounter (Signed)
Received pharmacy rejection stating that insurance will not cover Plavix  without a prior authorization. PA form received from [facility], completed and faxed back, approval now pending their decision.

## 2015-05-27 ENCOUNTER — Ambulatory Visit (HOSPITAL_COMMUNITY)
Admission: RE | Admit: 2015-05-27 | Discharge: 2015-05-27 | Disposition: A | Discharge status: Home / Self Care | Payer: Medicare PPO | Source: Ambulatory Visit | Attending: Cardiology | Admitting: Cardiology

## 2015-05-27 ENCOUNTER — Encounter (HOSPITAL_COMMUNITY): Payer: Self-pay

## 2015-05-27 VITALS — BP 120/45 | HR 98 | Resp 20 | Wt 188.8 lb

## 2015-05-27 DIAGNOSIS — Z7901 Long term (current) use of anticoagulants: Secondary | ICD-10-CM | POA: Diagnosis not present

## 2015-05-27 DIAGNOSIS — Z7982 Long term (current) use of aspirin: Secondary | ICD-10-CM | POA: Diagnosis not present

## 2015-05-27 DIAGNOSIS — I6529 Occlusion and stenosis of unspecified carotid artery: Secondary | ICD-10-CM | POA: Insufficient documentation

## 2015-05-27 DIAGNOSIS — E785 Hyperlipidemia, unspecified: Secondary | ICD-10-CM | POA: Insufficient documentation

## 2015-05-27 DIAGNOSIS — I739 Peripheral vascular disease, unspecified: Secondary | ICD-10-CM | POA: Insufficient documentation

## 2015-05-27 DIAGNOSIS — I4891 Unspecified atrial fibrillation: Secondary | ICD-10-CM | POA: Diagnosis not present

## 2015-05-27 DIAGNOSIS — I129 Hypertensive chronic kidney disease with stage 1 through stage 4 chronic kidney disease, or unspecified chronic kidney disease: Secondary | ICD-10-CM | POA: Diagnosis not present

## 2015-05-27 DIAGNOSIS — I255 Ischemic cardiomyopathy: Secondary | ICD-10-CM | POA: Diagnosis not present

## 2015-05-27 DIAGNOSIS — N189 Chronic kidney disease, unspecified: Secondary | ICD-10-CM

## 2015-05-27 DIAGNOSIS — I480 Paroxysmal atrial fibrillation: Secondary | ICD-10-CM

## 2015-05-27 DIAGNOSIS — E119 Type 2 diabetes mellitus without complications: Secondary | ICD-10-CM | POA: Diagnosis not present

## 2015-05-27 DIAGNOSIS — I2510 Atherosclerotic heart disease of native coronary artery without angina pectoris: Secondary | ICD-10-CM | POA: Insufficient documentation

## 2015-05-27 DIAGNOSIS — N182 Chronic kidney disease, stage 2 (mild): Secondary | ICD-10-CM

## 2015-05-27 DIAGNOSIS — Z951 Presence of aortocoronary bypass graft: Secondary | ICD-10-CM | POA: Insufficient documentation

## 2015-05-27 DIAGNOSIS — R0683 Snoring: Secondary | ICD-10-CM | POA: Diagnosis not present

## 2015-05-27 DIAGNOSIS — R0789 Other chest pain: Secondary | ICD-10-CM | POA: Diagnosis not present

## 2015-05-27 DIAGNOSIS — K219 Gastro-esophageal reflux disease without esophagitis: Secondary | ICD-10-CM | POA: Diagnosis not present

## 2015-05-27 DIAGNOSIS — Z87891 Personal history of nicotine dependence: Secondary | ICD-10-CM | POA: Diagnosis not present

## 2015-05-27 DIAGNOSIS — I6523 Occlusion and stenosis of bilateral carotid arteries: Secondary | ICD-10-CM

## 2015-05-27 DIAGNOSIS — I5032 Chronic diastolic (congestive) heart failure: Secondary | ICD-10-CM | POA: Insufficient documentation

## 2015-05-27 DIAGNOSIS — Z79899 Other long term (current) drug therapy: Secondary | ICD-10-CM | POA: Diagnosis not present

## 2015-05-27 DIAGNOSIS — D696 Thrombocytopenia, unspecified: Secondary | ICD-10-CM

## 2015-05-27 DIAGNOSIS — I25118 Atherosclerotic heart disease of native coronary artery with other forms of angina pectoris: Secondary | ICD-10-CM

## 2015-05-27 DIAGNOSIS — N390 Urinary tract infection, site not specified: Secondary | ICD-10-CM

## 2015-05-27 HISTORY — DX: Urinary tract infection, site not specified: N39.0

## 2015-05-27 LAB — BASIC METABOLIC PANEL
ANION GAP: 10 (ref 5–15)
BUN: 37 mg/dL — ABNORMAL HIGH (ref 6–20)
CALCIUM: 9.1 mg/dL (ref 8.9–10.3)
CO2: 23 mmol/L (ref 22–32)
Chloride: 106 mmol/L (ref 101–111)
Creatinine, Ser: 1.79 mg/dL — ABNORMAL HIGH (ref 0.61–1.24)
GFR calc Af Amer: 38 mL/min — ABNORMAL LOW (ref >60)
GFR calc non Af Amer: 33 mL/min — ABNORMAL LOW (ref >60)
Glucose, Bld: 142 mg/dL — ABNORMAL HIGH (ref 65–99)
POTASSIUM: 4.8 mmol/L (ref 3.5–5.1)
Sodium: 139 mmol/L (ref 135–145)

## 2015-05-27 LAB — CBC
HEMATOCRIT: 28.7 % — AB (ref 39.0–52.0)
Hemoglobin: 9.4 g/dL — ABNORMAL LOW (ref 13.0–17.0)
MCH: 31.8 pg (ref 26.0–34.0)
MCHC: 32.8 g/dL (ref 30.0–36.0)
MCV: 97 fL (ref 78.0–100.0)
Platelets: 304 10*3/uL (ref 150–400)
RBC: 2.96 MIL/uL — AB (ref 4.22–5.81)
RDW: 13.5 % (ref 11.5–15.5)
WBC: 9.8 10*3/uL (ref 4.0–10.5)

## 2015-05-27 LAB — BRAIN NATRIURETIC PEPTIDE: B NATRIURETIC PEPTIDE 5: 321.7 pg/mL — AB (ref 0.0–100.0)

## 2015-05-27 MED ORDER — FUROSEMIDE 40 MG PO TABS: ORAL_TABLET | ORAL | Status: DC

## 2015-05-27 MED ORDER — APIXABAN 2.5 MG PO TABS: 2.5000 mg | ORAL_TABLET | Freq: Two times a day (BID) | ORAL | Status: DC

## 2015-05-27 NOTE — Patient Instructions (Signed)
Stop Plavix  Stop Pletal  Start Eliquis 2.5 mg Twice daily   Increase Furosemide to 40 mg (1 tab) in AM and 20 mg (1/2 tab) in PM  Labs today  Your physician has requested that you have an echocardiogram. Echocardiography is a painless test that uses sound waves to create images of your heart. It provides your doctor with information about the size and shape of your heart and how well your heart's chambers and valves are working. This procedure takes approximately one hour. There are no restrictions for this procedure.  Your physician has requested that you have en exercise stress myoview. For further information please visit [email or web address]. Please follow instruction sheet, as given.  Your physician has requested that you have a carotid duplex. This test is an ultrasound of the carotid arteries in your neck. It looks at blood flow through these arteries that supply the brain with blood. Allow one hour for this exam. There are no restrictions or special instructions.  Your physician has requested that you have an ankle brachial index (ABI). During this test an ultrasound and blood pressure cuff are used to evaluate the arteries that supply the arms and legs with blood. Allow thirty minutes for this exam. There are no restrictions or special instructions.  Overnight oximetry test, [facility] will contact you to set this up in your home  Your physician has recommended that you have a sleep study. This test records several body functions during sleep, including: brain activity, eye movement, oxygen and carbon dioxide blood levels, heart rate and rhythm, breathing rate and rhythm, the flow of air through your mouth and nose, snoring, body muscle movements, and chest and belly movement.  Your physician recommends that you schedule a follow-up appointment in: 2 weeks with Dr [name]

## 2015-05-27 NOTE — Progress Notes (Signed)
Patient ambulated approx. 18ft to check sats on RA with ambulation, maintained 94-6% with no SOB noted.  Renee Pain

## 2015-05-28 ENCOUNTER — Telehealth (HOSPITAL_COMMUNITY): Payer: Self-pay | Admitting: Cardiology

## 2015-05-28 ENCOUNTER — Telehealth (HOSPITAL_COMMUNITY): Payer: Self-pay | Admitting: Surgery

## 2015-05-28 DIAGNOSIS — I5032 Chronic diastolic (congestive) heart failure: Secondary | ICD-10-CM | POA: Insufficient documentation

## 2015-05-28 DIAGNOSIS — I480 Paroxysmal atrial fibrillation: Secondary | ICD-10-CM | POA: Insufficient documentation

## 2015-05-28 NOTE — Telephone Encounter (Signed)
-----   Message from Larey Dresser, MD sent at 05/27/2015  5:57 PM EDT ----- Creatinine is stable, he is anemic.  May be product of recent hospitalization, will need to follow.

## 2015-05-28 NOTE — Telephone Encounter (Signed)
Pt aware and voiced understanding 

## 2015-05-28 NOTE — Telephone Encounter (Signed)
Called to inform patient of recent labwork.  Left message to return phone call.

## 2015-05-28 NOTE — Progress Notes (Signed)
Patient ID: Andrew [name], male   DOB: 1928/11/28, 81 y.o.   MRN: [id number] PCP: Dr. Ronnald Ramp  81 yo with history of CKD, CAD s/p CABG, and ischemic cardiomyopathy with primarily diastolic CHF presents for cardiology followup.  He has a history of PAD followed at [facility].  I took him for [facility] in 11/15. 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  He had had occlusion of a relatively small LAD, there were right to left collaterals and no intervention was done (medical management).    He was stable when I last saw him with no chest pain and minimal exertional dyspnea.  He and his wife went on a cruise along the [employer] [place] in 5/16.  He admits to considerable dietary indiscretion (high sodium diet).  It appears that he developed a hypertensive crisis along with chest pain while on the ship. He went into atrial fibrillation and developed CHF.  He was taken to the hospital in [place], [place].  He was in the ICU for about a week on Bipap intermittently.  Troponin peaked at 0.09 during this admission.  He was diuresed and after about 2 wks left the hospital and was able to fly home.  He got back home on Sunday.   Given the severity of his problems during this last hospitalization, Mr Ponzi seems to be doing quite well now.  Oxygen saturation is 93% at rest but rises to 94-96% with ambulation.  He has no dyspnea walking on flat ground.  He spent 3 hours working in his yard today without problems.  No further chest pain. He has hip and knee pain, but actually does not seem to have significant claudication-type symptoms.  He does, of note, have daytime sleepiness and snores loudly.   ECG: NSR, 1st degree AV block, LAFB, poor RWP  Labs (12/14): K 4.8, creatinine 1.5 Labs (5/15): LDL 92 Labs (11/15): K 4.1, creatinine 1.5 Labs (12/15): K 4, creatinine 1.5 Labs (1/16): LFTs normal, LDL 51, HDL 47 Labs (5/16): TnI 0.09  PMH: 1. CKD 2. HTN 3. Hyperlipidemia 4. AAA: s/p resection with left renal artery bypass  in 1997 5. GERD 6. IBS 7. Melanoma s/p reception 8. Diabetes: Diet-controlled 9. Carotid stenosis: Carotid dopplers (4/15) with 40-59% bilateral stenosis.  10. PAD: 9/14 ABIs 0.91 right 1.09 left.  11. CAD: Has super-dominant RCA. s/p CABG 1992.  He had Taxus DES to mid PDA in 5/02. LHC (1/05) with 90% ostial PDA (had DES to this vessel), totally occluded RIMA-PDA, totally occluded PLV, sequential SVG-PLV with 1 branch occluded.  LHC (11/15) with left main off the right cusp, 40-50% ostial left main, total occlusion of the proximal LAD (small vessel) with right to left collaterals, large super-dominant RCA with 50% ISR mid RCA, 50% ostial PDA, patent SVG-PLV, RIMA-PDA known atretic.  12. Ischemic cardiomyopathy: Echo (12/09) with EF 45-50%, basal to mid inferolateral hypokinesis. Echo (6/15) with EF 45-50%, akinesis of the mid to apical inferolateral wall.  13. OA 14. Atrial fibrillation: Paroxysmal.  Noted during 5/16 hospitalization in [place].   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  FH: Brother with CAD.   ROS: All systems reviewed and negative except as per HPI.   Current Outpatient Prescriptions  Medication Sig Dispense Refill  . ALPRAZolam (XANAX) 0.5 MG tablet Take 0.5 tablets (0.25 mg total) by mouth 2 (two) times daily as needed for anxiety. 60 tablet 5  . aspirin EC 81 MG tablet  Take 1 tablet (81 mg total) by mouth daily.    Marland Kitchen atorvastatin (LIPITOR) 80 MG tablet Take 1 tablet (80 mg total) by mouth daily. 30 tablet 6  . erythromycin with ethanol (THERAMYCIN) 2 % external solution Apply 1 application topically daily as needed (for skin irritation around neck).    . ezetimibe (ZETIA) 10 MG tablet Take 10 mg by mouth daily.    . fenofibrate (TRICOR) 145 MG tablet TAKE 1 TABLET ONCE DAILY. 90 tablet 1  . FLUoxetine (PROZAC) 20 MG capsule Take 1 capsule (20 mg total) by mouth daily. 90 capsule 3  . furosemide (LASIX) 40 MG tablet  Take 1 tab in AM and 1/2 tab in PM 90 tablet 1  . glucosamine-chondroitin 500-400 MG tablet Take 1 tablet by mouth daily.     . Hypromellose (GENTEAL MILD) 0.2 % SOLN Place 1 drop into both eyes daily as needed (for dry eyes).     Marland Kitchen losartan (COZAAR) 100 MG tablet TAKE 1 TABLET ONCE DAILY. 90 tablet 3  . meloxicam (MOBIC) 7.5 MG tablet TAKE 1 TABLET ONCE DAILY. 90 tablet 3  . Misc Natural Products (NF FORMULAS TESTOSTERONE) CAPS Take 1 capsule by mouth 2 (two) times daily.    . Multiple Vitamin (MULTIVITAMIN) tablet Take 1 tablet by mouth daily.    Marland Kitchen NIFEdipine (PROCARDIA-XL/ADALAT-CC/NIFEDICAL-XL) 30 MG 24 hr tablet Take 30 mg by mouth daily.    . nitroGLYCERIN (NITROSTAT) 0.4 MG SL tablet Place 0.4 mg under the tongue every 5 (five) minutes as needed for chest pain.    Marland Kitchen omega-3 acid ethyl esters (LOVAZA) 1 G capsule Take 1 g by mouth 2 (two) times daily.    . potassium gluconate 595 MG TABS Take 595 mg by mouth daily.    . tamsulosin (FLOMAX) 0.4 MG CAPS capsule Take 0.4 mg by mouth daily after supper.    . temazepam (RESTORIL) 30 MG capsule TAKE 1 CAPSULE AT BEDTIME AS NEEDED FOR SLEEP. 30 capsule 5  . apixaban (ELIQUIS) 2.5 MG TABS tablet Take 1 tablet (2.5 mg total) by mouth 2 (two) times daily. 60 tablet 3   No current facility-administered medications for this encounter.     BP 120/45 mmHg  Pulse 98  Resp 20  Wt 188 lb 12 oz (85.616 kg)  SpO2 93% General: NAD Neck: JVP 7 cm with HJR, no thyromegaly or thyroid nodule.  Lungs: Clear to auscultation bilaterally with normal respiratory effort. CV: Nondisplaced PMI.  Heart regular S1/S2, no S3/S4, 2/6 early SEM RUSB.  1+ bilateral ankle edema.  Right carotid bruit.  Difficult to palpate pedal pulses.  Abdomen: Soft, nontender, no hepatosplenomegaly, no distention.  Skin: Intact without lesions or rashes.  Neurologic: Alert and oriented x 3.  Psych: Normal affect. Extremities: No clubbing or cyanosis.   Assessment/Plan:  1. CAD:  s/p CABG.  Recent cath in 11/15 showed occluded LAD with left to right collaterals.  The LAD was a relatively small vessel (super-dominant right).  I managed him medically. He had chest pain in the setting of his recent admission to hospital in [place] with CHF exacerbation.  No chest pain since he has been home.  - Given atrial fibrillation, I am going to put him on Eliquis.  I will have him continue ASA 81 daily for now but stop Plavix.  - Continue statin, ARB.  - Given recent event in [place], I will arrange for ETT-Cardiolite to risk stratify.  2. Ischemic cardiomyopathy with primarily diastolic CHF: EF [id number]  on last echo in 6/15.  He was admitted to a hospital in [place] in 5/16 with CHF exacerbation in the setting of atrial fibrillation and chest pain.  On exam today, he is very mildly volume overloaded.  NYHA class II symptoms. - Increase Lasix to 40 qam, 20 qpm.  BMET today and at followup in 2 wks.  - I will arrange for a repeat echo.  3. Carotid stenosis: Due for repeat carotid dopplers.  4. PAD: Followed at [facility].  Leg symptoms now seem mainly joint-related rather than claudication.  As I am starting Eliquis and as he has developed CHF, I will stop cilostazol.  5. Hyperlipidemia: Good lipids in 1/16.  6. CKD: BMET today to followup on creatinine.  7. Atrial fibrillation: Paroxysmal, first noted in [place].  He is in NSR today.  As above, will start Eliquis 2.5 mg bid (based on age and renal function).  I will continue ASA 81 daily but will stop Plavix and cilostazol.  8. Suspected OSA: I suspect he has OSA.  Will arrange for sleep study.  As it may take a while to get this done, I am going to see if we can go ahead and get an overnight oximetry.   Followup in 2 wks    Loralie Champagne 05/28/2015

## 2015-05-28 NOTE — Telephone Encounter (Signed)
-----   Message from Larey Dresser, MD sent at 05/28/2015  1:18 AM EDT ----- Andrew [name]' creatinine is 1.79.  Needs to take Eliquis 2.5 mg bid rather than 5 mg bid.  Please call him to make this change, thanks.

## 2015-06-01 ENCOUNTER — Other Ambulatory Visit (HOSPITAL_COMMUNITY): Payer: Medicare PPO

## 2015-06-02 ENCOUNTER — Ambulatory Visit: Payer: Medicare PPO | Admitting: Family

## 2015-06-03 ENCOUNTER — Encounter: Payer: Self-pay | Admitting: Cardiology

## 2015-06-03 ENCOUNTER — Ambulatory Visit (HOSPITAL_COMMUNITY): Payer: Medicare PPO

## 2015-06-04 ENCOUNTER — Telehealth (HOSPITAL_COMMUNITY): Payer: Self-pay

## 2015-06-04 NOTE — Telephone Encounter (Signed)
Left message on voicemail in reference to upcoming appointment scheduled for 06-09-2015. Phone number given for a call back so details instructions can be given. Oletta Lamas, Jammal Sarr A

## 2015-06-08 ENCOUNTER — Telehealth (HOSPITAL_COMMUNITY): Payer: Self-pay | Admitting: *Deleted

## 2015-06-08 NOTE — Telephone Encounter (Signed)
Left message on voicemail in reference to upcoming appointment scheduled for 06/09/15. Phone number given for a call back so details instructions can be given. Andrew [name], Ranae Palms

## 2015-06-09 ENCOUNTER — Telehealth (HOSPITAL_COMMUNITY): Payer: Self-pay | Admitting: Cardiology

## 2015-06-09 ENCOUNTER — Telehealth (HOSPITAL_COMMUNITY): Payer: Self-pay | Admitting: Vascular Surgery

## 2015-06-09 ENCOUNTER — Ambulatory Visit (HOSPITAL_COMMUNITY): Payer: Medicare PPO | Attending: Cardiology

## 2015-06-09 DIAGNOSIS — I2510 Atherosclerotic heart disease of native coronary artery without angina pectoris: Secondary | ICD-10-CM | POA: Insufficient documentation

## 2015-06-09 DIAGNOSIS — R0789 Other chest pain: Secondary | ICD-10-CM

## 2015-06-09 DIAGNOSIS — I4891 Unspecified atrial fibrillation: Secondary | ICD-10-CM | POA: Diagnosis not present

## 2015-06-09 DIAGNOSIS — R9439 Abnormal result of other cardiovascular function study: Secondary | ICD-10-CM | POA: Diagnosis not present

## 2015-06-09 DIAGNOSIS — R0609 Other forms of dyspnea: Secondary | ICD-10-CM | POA: Diagnosis not present

## 2015-06-09 DIAGNOSIS — Z951 Presence of aortocoronary bypass graft: Secondary | ICD-10-CM | POA: Insufficient documentation

## 2015-06-09 LAB — MYOCARDIAL PERFUSION IMAGING
CHL CUP MPHR: 136 {beats}/min
CHL CUP NUCLEAR SRS: 16
CHL CUP RESTING HR STRESS: 77 {beats}/min
CSEPEW: 4.6 METS
Exercise duration (min): 4 min
Exercise duration (sec): 0 s
LV sys vol: 94 mL
LVDIAVOL: 164 mL
NUC STRESS EF: 42 %
Peak HR: 123 {beats}/min
Percent HR: 90 %
RATE: 0.42
SDS: 0
SSS: 16
TID: 0.97

## 2015-06-09 MED ORDER — TECHNETIUM TC 99M SESTAMIBI GENERIC - CARDIOLITE
11.0000 | Freq: Once | INTRAVENOUS | Status: AC | PRN
Start: 2015-06-09 — End: 2015-06-09
  Administered 2015-06-09: 11 via INTRAVENOUS

## 2015-06-09 MED ORDER — TECHNETIUM TC 99M SESTAMIBI GENERIC - CARDIOLITE
33.0000 | Freq: Once | INTRAVENOUS | Status: AC | PRN
  Administered 2015-06-09: 33 via INTRAVENOUS

## 2015-06-09 NOTE — Telephone Encounter (Signed)
Left pt second message about changing his echo before appt ON 6/17

## 2015-06-09 NOTE — Telephone Encounter (Signed)
Pt scheduled for sleep study per Dr.McLean on 08/11/15 Cpt code 95811 With pts current insurance [facility] Pre cert # [id number]

## 2015-06-11 NOTE — Telephone Encounter (Signed)
Echo resch to 6/17 at 8am

## 2015-06-12 ENCOUNTER — Encounter (HOSPITAL_COMMUNITY): Payer: Self-pay

## 2015-06-12 ENCOUNTER — Telehealth (HOSPITAL_COMMUNITY): Payer: Self-pay | Admitting: *Deleted

## 2015-06-12 ENCOUNTER — Ambulatory Visit (HOSPITAL_COMMUNITY)
Admission: RE | Admit: 2015-06-12 | Discharge: 2015-06-12 | Disposition: A | Discharge status: Home / Self Care | Payer: Medicare PPO | Source: Ambulatory Visit | Attending: Cardiology | Admitting: Cardiology

## 2015-06-12 ENCOUNTER — Other Ambulatory Visit (HOSPITAL_COMMUNITY): Payer: Medicare PPO

## 2015-06-12 ENCOUNTER — Ambulatory Visit (HOSPITAL_BASED_OUTPATIENT_CLINIC_OR_DEPARTMENT_OTHER)
Admission: RE | Admit: 2015-06-12 | Discharge: 2015-06-12 | Disposition: A | Discharge status: Home / Self Care | Payer: Medicare PPO | Source: Ambulatory Visit | Attending: Cardiology | Admitting: Cardiology

## 2015-06-12 VITALS — BP 130/72 | HR 71 | Wt 186.5 lb

## 2015-06-12 DIAGNOSIS — I6523 Occlusion and stenosis of bilateral carotid arteries: Secondary | ICD-10-CM | POA: Insufficient documentation

## 2015-06-12 DIAGNOSIS — I739 Peripheral vascular disease, unspecified: Secondary | ICD-10-CM | POA: Insufficient documentation

## 2015-06-12 DIAGNOSIS — I129 Hypertensive chronic kidney disease with stage 1 through stage 4 chronic kidney disease, or unspecified chronic kidney disease: Secondary | ICD-10-CM | POA: Diagnosis not present

## 2015-06-12 DIAGNOSIS — Z7982 Long term (current) use of aspirin: Secondary | ICD-10-CM | POA: Diagnosis not present

## 2015-06-12 DIAGNOSIS — Z7901 Long term (current) use of anticoagulants: Secondary | ICD-10-CM | POA: Diagnosis not present

## 2015-06-12 DIAGNOSIS — I5032 Chronic diastolic (congestive) heart failure: Secondary | ICD-10-CM | POA: Insufficient documentation

## 2015-06-12 DIAGNOSIS — Z951 Presence of aortocoronary bypass graft: Secondary | ICD-10-CM | POA: Diagnosis not present

## 2015-06-12 DIAGNOSIS — N189 Chronic kidney disease, unspecified: Secondary | ICD-10-CM | POA: Diagnosis not present

## 2015-06-12 DIAGNOSIS — I2510 Atherosclerotic heart disease of native coronary artery without angina pectoris: Secondary | ICD-10-CM | POA: Diagnosis not present

## 2015-06-12 DIAGNOSIS — E119 Type 2 diabetes mellitus without complications: Secondary | ICD-10-CM | POA: Insufficient documentation

## 2015-06-12 DIAGNOSIS — I255 Ischemic cardiomyopathy: Secondary | ICD-10-CM

## 2015-06-12 DIAGNOSIS — I480 Paroxysmal atrial fibrillation: Secondary | ICD-10-CM | POA: Diagnosis not present

## 2015-06-12 DIAGNOSIS — N183 Chronic kidney disease, stage 3 unspecified: Secondary | ICD-10-CM

## 2015-06-12 DIAGNOSIS — Z79899 Other long term (current) drug therapy: Secondary | ICD-10-CM | POA: Insufficient documentation

## 2015-06-12 DIAGNOSIS — E785 Hyperlipidemia, unspecified: Secondary | ICD-10-CM | POA: Insufficient documentation

## 2015-06-12 DIAGNOSIS — K219 Gastro-esophageal reflux disease without esophagitis: Secondary | ICD-10-CM | POA: Insufficient documentation

## 2015-06-12 MED ORDER — FUROSEMIDE 40 MG PO TABS: 40.0000 mg | ORAL_TABLET | Freq: Two times a day (BID) | ORAL | Status: DC

## 2015-06-12 MED ORDER — AMIODARONE HCL 200 MG PO TABS: 200.0000 mg | ORAL_TABLET | Freq: Two times a day (BID) | ORAL | Status: DC

## 2015-06-12 MED ORDER — CILOSTAZOL 100 MG PO TABS
100.0000 mg | ORAL_TABLET | Freq: Two times a day (BID) | ORAL | Status: DC
Start: 2015-06-12 — End: 2015-06-22

## 2015-06-12 NOTE — Progress Notes (Signed)
Echocardiogram 2D Echocardiogram has been performed.  Andrew [name] 06/12/2015, 8:51 AM

## 2015-06-12 NOTE — Telephone Encounter (Signed)
Spoke with pts wife advised her to tell pt to take 2.5mg  of eliquis bid instead of 5mg  bid (per Dr.McLean) she verbally stated she understood and would make the medication change for pt.

## 2015-06-12 NOTE — Patient Instructions (Addendum)
Increase Furosemide (Lasix) to 40 mg Twice daily   Restart Pletal 100 mg Twice daily   Start Amiodarone 200 mg Twice daily   Stop Meloxicam  Labs in 1 week  Your physician recommends that you schedule a follow-up appointment in: 2 weeks

## 2015-06-14 DIAGNOSIS — N183 Chronic kidney disease, stage 3 (moderate): Secondary | ICD-10-CM

## 2015-06-14 DIAGNOSIS — N1832 Chronic kidney disease, stage 3b: Secondary | ICD-10-CM | POA: Insufficient documentation

## 2015-06-14 NOTE — Progress Notes (Signed)
Patient ID: Andrew [name], male   DOB: 07/27/1928, 81 y.o.   MRN: [id number] PCP: Dr. Ronnald Ramp  81 yo with history of CKD, CAD s/p CABG, and ischemic cardiomyopathy with primarily diastolic CHF presents for cardiology followup.  He has a history of PAD followed at [facility].  I took him for [facility] in 11/15. 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  He had had occlusion of a relatively small LAD, there were right to left collaterals and no intervention was done (medical management).    He and his wife went on a cruise along the [employer] in 5/16.  He admits to considerable dietary indiscretion (high sodium diet).  It appears that he developed a hypertensive crisis along with chest pain while on the ship. He went into atrial fibrillation and developed CHF.  He was taken to the hospital in [place], [place].  He was in the ICU for about a week on Bipap intermittently.  Troponin peaked at 0.09 during this admission.  He was diuresed and after about 2 wks left the hospital and was able to fly home.    At last appointment, I started him on apixaban and had him stop Plavix and cilostazol.  His leg pain considerably worsened off cilostazol and he asked to restart it.  This is not ideal but very poor quality of life without it so he is back on it.  He gets pain in his calves and thighs now only with walking up hills.  No problems on flat ground.  Since last appointment, he had echo (6/16) showing EF 50-55%, mild LVH.  Cardiolite (6/16) showed EF 42%, prior anterolateral MI, no ischemia.  No chest pain other than an episode of reflux after a large meal.  Feeling better overall.  Not short of breath walking on flat ground.  Weight down 2 lbs.  Today, he was noted to be back in atrial fibrillation.   ECG: Atrial fibrillation, left axis deviation.   Labs (12/14): K 4.8, creatinine 1.5 Labs (5/15): LDL 92 Labs (11/15): K 4.1, creatinine 1.5 Labs (12/15): K 4, creatinine 1.5 Labs (1/16): LFTs normal, LDL 51, HDL  47 Labs (5/16): TnI 0.09 Labs (6/16): K 4.8, creatinine 1.79, HCT 28.7  PMH: 1. CKD 2. HTN 3. Hyperlipidemia 4. AAA: s/p resection with left renal artery bypass in 1997 5. GERD 6. IBS 7. Melanoma s/p reception 8. Diabetes: Diet-controlled 9. Carotid stenosis: Carotid dopplers (4/15) with 40-59% bilateral stenosis.  10. PAD: 9/14 ABIs 0.91 right 1.09 left.  11. CAD: Has super-dominant RCA. s/p CABG 1992.  He had Taxus DES to mid PDA in 5/02. LHC (1/05) with 90% ostial PDA (had DES to this vessel), totally occluded RIMA-PDA, totally occluded PLV, sequential SVG-PLV with 1 branch occluded.  LHC (11/15) with left main off the right cusp, 40-50% ostial left main, total occlusion of the proximal LAD (small vessel) with right to left collaterals, large super-dominant RCA with 50% ISR mid RCA, 50% ostial PDA, patent SVG-PLV, RIMA-PDA known atretic. Lexiscan Cardiolite (6/16) with EF 42%, prior anterolateral MI, no ischemia.  12. Ischemic cardiomyopathy: Echo (12/09) with EF 45-50%, basal to mid inferolateral hypokinesis. Echo (6/15) with EF 45-50%, akinesis of the mid to apical inferolateral wall.  Echo (6/16) with EF 50-55%, mild LVH, inferior hypokinesis, mild MR.  13. OA 14. Atrial fibrillation: Paroxysmal.  Noted during 5/16 hospitalization in [place] and again in 6/16.   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  FH: Brother with CAD.   ROS: All systems reviewed and negative except as per HPI.   Current Outpatient Prescriptions  Medication Sig Dispense Refill  . ALPRAZolam (XANAX) 0.5 MG tablet Take 0.5 tablets (0.25 mg total) by mouth 2 (two) times daily as needed for anxiety. 60 tablet 5  . apixaban (ELIQUIS) 2.5 MG TABS tablet Take 1 tablet (2.5 mg total) by mouth 2 (two) times daily. 60 tablet 3  . aspirin EC 81 MG tablet Take 1 tablet (81 mg total) by mouth daily.    Marland Kitchen atorvastatin (LIPITOR) 80 MG tablet Take 1 tablet (80 mg total) by  mouth daily. 30 tablet 6  . erythromycin with ethanol (THERAMYCIN) 2 % external solution Apply 1 application topically daily as needed (for skin irritation around neck).    . ezetimibe (ZETIA) 10 MG tablet Take 10 mg by mouth daily.    . fenofibrate (TRICOR) 145 MG tablet TAKE 1 TABLET ONCE DAILY. 90 tablet 1  . FLUoxetine (PROZAC) 20 MG capsule Take 1 capsule (20 mg total) by mouth daily. 90 capsule 3  . furosemide (LASIX) 40 MG tablet Take 1 tablet (40 mg total) by mouth 2 (two) times daily. 60 tablet 3  . glucosamine-chondroitin 500-400 MG tablet Take 1 tablet by mouth daily.     . Hypromellose (GENTEAL MILD) 0.2 % SOLN Place 1 drop into both eyes daily as needed (for dry eyes).     Marland Kitchen losartan (COZAAR) 100 MG tablet TAKE 1 TABLET ONCE DAILY. 90 tablet 3  . Misc Natural Products (NF FORMULAS TESTOSTERONE) CAPS Take 1 capsule by mouth 2 (two) times daily.    . Multiple Vitamin (MULTIVITAMIN) tablet Take 1 tablet by mouth daily.    Marland Kitchen NIFEdipine (PROCARDIA-XL/ADALAT-CC/NIFEDICAL-XL) 30 MG 24 hr tablet Take 30 mg by mouth daily.    . nitroGLYCERIN (NITROSTAT) 0.4 MG SL tablet Place 0.4 mg under the tongue every 5 (five) minutes as needed for chest pain.    Marland Kitchen omega-3 acid ethyl esters (LOVAZA) 1 G capsule Take 1 g by mouth 2 (two) times daily.    . potassium gluconate 595 MG TABS Take 595 mg by mouth daily.    . tamsulosin (FLOMAX) 0.4 MG CAPS capsule Take 0.4 mg by mouth daily after supper.    . temazepam (RESTORIL) 30 MG capsule TAKE 1 CAPSULE AT BEDTIME AS NEEDED FOR SLEEP. 30 capsule 5  . amiodarone (PACERONE) 200 MG tablet Take 1 tablet (200 mg total) by mouth 2 (two) times daily. 60 tablet 3  . cilostazol (PLETAL) 100 MG tablet Take 1 tablet (100 mg total) by mouth 2 (two) times daily.     No current facility-administered medications for this encounter.     BP 130/72 mmHg  Pulse 71  Wt 186 lb 8 oz (84.596 kg)  SpO2 97% General: NAD Neck: JVP 8-9 cm with HJR, no thyromegaly or thyroid  nodule.  Lungs: Clear to auscultation bilaterally with normal respiratory effort. CV: Nondisplaced PMI.  Heart irregular S1/S2, no S3/S4, 2/6 early SEM RUSB.  1+ edema to knees bilaterally.  Right carotid bruit.  Difficult to palpate pedal pulses.  Abdomen: Soft, nontender, no hepatosplenomegaly, no distention.  Skin: Intact without lesions or rashes.  Neurologic: Alert and oriented x 3.  Psych: Normal affect. Extremities: No clubbing or cyanosis.   Assessment/Plan:  1. CAD: s/p CABG.  Cath in 11/15 showed occluded LAD with left to right collaterals.  The LAD was a relatively small vessel (super-dominant right).  I managed him medically. He had chest pain in the setting of his recent admission to hospital in [place] with CHF exacerbation.  No chest pain since he has been home. Lexiscan Cardiolite in 6/16 with infarction but no ischemia.  - Continue ASA 81.   - Continue statin, ARB.  2. Ischemic cardiomyopathy with primarily diastolic CHF: EF [id number] on last echo in 6/16.  He was admitted to a hospital in [place] in 5/16 with CHF exacerbation in the setting of atrial fibrillation and chest pain.  NYHA class II symptoms but looks more volume overloaded today.  This may be due to going back into atrial fibrilaltion. - Increase Lasix to 40 mg bid.  BMET/BNP in 1 week.  3. Carotid stenosis: Due for repeat carotid dopplers, to be done at [facility].  4. PAD: Followed at [facility].  I stopped cilostazol due to CHF and starting Eliquis.  However, off cilostazol, he developed very severe claudication.  Although not ideal, given quality of life concerns I restarted cilostazol, and he feels much better.  5. Hyperlipidemia: Good lipids in 1/16.  6. CKD: BMET in 1 week.  7. Atrial fibrillation: He is back in atrial fibrillation today.  I suspect this may be contributing to worsening volume overload.   - Start amiodarone 200 mg bid.  Will need to follow LFTs, TSH and get regular eye exams. He will return in 2 wks.  If he  remains in atrial fibrillation, can plan DCCV (he has been on Eliquis).  - With Eliquis use, I asked him to stop meloxicam.  He will use Tylenol for pain.  8. Suspected OSA: Awaiting sleep study.  Followup in 2 wks    Loralie Champagne 06/14/2015

## 2015-06-17 ENCOUNTER — Ambulatory Visit (HOSPITAL_COMMUNITY)
Admission: RE | Admit: 2015-06-17 | Discharge: 2015-06-17 | Disposition: A | Discharge status: Home / Self Care | Payer: Medicare PPO | Source: Ambulatory Visit | Attending: Internal Medicine | Admitting: Internal Medicine

## 2015-06-17 DIAGNOSIS — I5032 Chronic diastolic (congestive) heart failure: Secondary | ICD-10-CM | POA: Diagnosis present

## 2015-06-17 DIAGNOSIS — I5022 Chronic systolic (congestive) heart failure: Secondary | ICD-10-CM | POA: Diagnosis not present

## 2015-06-17 LAB — BRAIN NATRIURETIC PEPTIDE: B Natriuretic Peptide: 245.4 pg/mL — ABNORMAL HIGH (ref 0.0–100.0)

## 2015-06-17 LAB — BASIC METABOLIC PANEL
Anion gap: 10 (ref 5–15)
BUN: 34 mg/dL — ABNORMAL HIGH (ref 6–20)
CHLORIDE: 103 mmol/L (ref 101–111)
CO2: 27 mmol/L (ref 22–32)
CREATININE: 2.26 mg/dL — AB (ref 0.61–1.24)
Calcium: 9.6 mg/dL (ref 8.9–10.3)
GFR calc Af Amer: 29 mL/min — ABNORMAL LOW (ref >60)
GFR calc non Af Amer: 25 mL/min — ABNORMAL LOW (ref >60)
GLUCOSE: 143 mg/dL — AB (ref 65–99)
Potassium: 4.4 mmol/L (ref 3.5–5.1)
Sodium: 140 mmol/L (ref 135–145)

## 2015-06-19 ENCOUNTER — Ambulatory Visit (INDEPENDENT_AMBULATORY_CARE_PROVIDER_SITE_OTHER): Payer: Medicare PPO | Admitting: Podiatry

## 2015-06-19 DIAGNOSIS — I10 Essential (primary) hypertension: Secondary | ICD-10-CM

## 2015-06-19 DIAGNOSIS — M79676 Pain in unspecified toe(s): Secondary | ICD-10-CM | POA: Diagnosis not present

## 2015-06-19 DIAGNOSIS — I739 Peripheral vascular disease, unspecified: Secondary | ICD-10-CM

## 2015-06-19 DIAGNOSIS — B351 Tinea unguium: Secondary | ICD-10-CM | POA: Diagnosis not present

## 2015-06-20 NOTE — Progress Notes (Signed)
Patient ID: Andrew [name], male   DOB: [birth year], 81 y.o.   MRN: [id number]  Subjective: 81 y.o.-year-old male returns the office today for painful, elongated, thickened toenails which he is unable to trim himself. Denies any redness or drainage around the nails. Denies any acute changes since last appointment and no new complaints today. Denies any systemic complaints such as fevers, chills, nausea, vomiting.   Objective: AAO 3, NAD DP/PT pulses 1/4, CRT less than 3 seconds Protective sensation intact with Simms Weinstein monofilament Nails hypertrophic, dystrophic, elongated, brittle, discolored 10. There is tenderness overlying the nails 1-5 bilaterally. There is no surrounding erythema or drainage along the nail sites. No open lesions identified.  No other areas of tenderness bilateral lower extremities. No overlying edema, erythema, increased warmth. No pain with calf compression, swelling, warmth, erythema.  Assessment: Patient presents with symptomatic onychomycosis  Plan: -Treatment options including alternatives, risks, complications were discussed -Nails sharply debrided 10 without complication/bleeding. -Discussed daily foot inspection. If there are any changes, to call the office immediately.  -Follow-up in 3 months or sooner if any problems are to arise. In the meantime, encouraged to call the office with any questions, concerns, changes symptoms.  Celesta Gentile, DPM

## 2015-06-22 ENCOUNTER — Other Ambulatory Visit: Payer: Self-pay | Admitting: Internal Medicine

## 2015-06-30 ENCOUNTER — Ambulatory Visit (HOSPITAL_COMMUNITY)
Admission: RE | Admit: 2015-06-30 | Discharge: 2015-06-30 | Disposition: A | Discharge status: Home / Self Care | Payer: Medicare PPO | Source: Ambulatory Visit | Attending: Cardiology | Admitting: Cardiology

## 2015-06-30 ENCOUNTER — Encounter (HOSPITAL_COMMUNITY): Payer: Self-pay

## 2015-06-30 VITALS — BP 111/53 | HR 73 | Resp 18 | Wt 183.5 lb

## 2015-06-30 DIAGNOSIS — N189 Chronic kidney disease, unspecified: Secondary | ICD-10-CM | POA: Insufficient documentation

## 2015-06-30 DIAGNOSIS — I129 Hypertensive chronic kidney disease with stage 1 through stage 4 chronic kidney disease, or unspecified chronic kidney disease: Secondary | ICD-10-CM | POA: Diagnosis not present

## 2015-06-30 DIAGNOSIS — I2510 Atherosclerotic heart disease of native coronary artery without angina pectoris: Secondary | ICD-10-CM | POA: Insufficient documentation

## 2015-06-30 DIAGNOSIS — I255 Ischemic cardiomyopathy: Secondary | ICD-10-CM | POA: Diagnosis not present

## 2015-06-30 DIAGNOSIS — Z79899 Other long term (current) drug therapy: Secondary | ICD-10-CM | POA: Diagnosis not present

## 2015-06-30 DIAGNOSIS — Z7982 Long term (current) use of aspirin: Secondary | ICD-10-CM | POA: Diagnosis not present

## 2015-06-30 DIAGNOSIS — I739 Peripheral vascular disease, unspecified: Secondary | ICD-10-CM

## 2015-06-30 DIAGNOSIS — E785 Hyperlipidemia, unspecified: Secondary | ICD-10-CM | POA: Diagnosis not present

## 2015-06-30 DIAGNOSIS — M1990 Unspecified osteoarthritis, unspecified site: Secondary | ICD-10-CM | POA: Diagnosis not present

## 2015-06-30 DIAGNOSIS — Z85820 Personal history of malignant melanoma of skin: Secondary | ICD-10-CM | POA: Insufficient documentation

## 2015-06-30 DIAGNOSIS — I6523 Occlusion and stenosis of bilateral carotid arteries: Secondary | ICD-10-CM | POA: Insufficient documentation

## 2015-06-30 DIAGNOSIS — I5032 Chronic diastolic (congestive) heart failure: Secondary | ICD-10-CM | POA: Diagnosis not present

## 2015-06-30 DIAGNOSIS — I480 Paroxysmal atrial fibrillation: Secondary | ICD-10-CM | POA: Diagnosis not present

## 2015-06-30 DIAGNOSIS — I5030 Unspecified diastolic (congestive) heart failure: Secondary | ICD-10-CM | POA: Insufficient documentation

## 2015-06-30 DIAGNOSIS — K219 Gastro-esophageal reflux disease without esophagitis: Secondary | ICD-10-CM | POA: Insufficient documentation

## 2015-06-30 DIAGNOSIS — Z7902 Long term (current) use of antithrombotics/antiplatelets: Secondary | ICD-10-CM | POA: Insufficient documentation

## 2015-06-30 DIAGNOSIS — Z951 Presence of aortocoronary bypass graft: Secondary | ICD-10-CM | POA: Diagnosis not present

## 2015-06-30 DIAGNOSIS — E1122 Type 2 diabetes mellitus with diabetic chronic kidney disease: Secondary | ICD-10-CM | POA: Diagnosis not present

## 2015-06-30 LAB — COMPREHENSIVE METABOLIC PANEL
ALK PHOS: 25 U/L — AB (ref 38–126)
ALT: 24 U/L (ref 17–63)
AST: 29 U/L (ref 15–41)
Albumin: 3.8 g/dL (ref 3.5–5.0)
Anion gap: 9 (ref 5–15)
BUN: 40 mg/dL — AB (ref 6–20)
CO2: 27 mmol/L (ref 22–32)
Calcium: 9.4 mg/dL (ref 8.9–10.3)
Chloride: 104 mmol/L (ref 101–111)
Creatinine, Ser: 2.21 mg/dL — ABNORMAL HIGH (ref 0.61–1.24)
GFR calc Af Amer: 30 mL/min — ABNORMAL LOW (ref >60)
GFR calc non Af Amer: 26 mL/min — ABNORMAL LOW (ref >60)
Glucose, Bld: 121 mg/dL — ABNORMAL HIGH (ref 65–99)
POTASSIUM: 5 mmol/L (ref 3.5–5.1)
Sodium: 140 mmol/L (ref 135–145)
TOTAL PROTEIN: 7 g/dL (ref 6.5–8.1)
Total Bilirubin: 0.6 mg/dL (ref 0.3–1.2)

## 2015-06-30 LAB — CBC
HEMATOCRIT: 31.2 % — AB (ref 39.0–52.0)
HEMOGLOBIN: 10.2 g/dL — AB (ref 13.0–17.0)
MCH: 31.3 pg (ref 26.0–34.0)
MCHC: 32.7 g/dL (ref 30.0–36.0)
MCV: 95.7 fL (ref 78.0–100.0)
Platelets: 191 10*3/uL (ref 150–400)
RBC: 3.26 MIL/uL — ABNORMAL LOW (ref 4.22–5.81)
RDW: 13.5 % (ref 11.5–15.5)
WBC: 7.4 10*3/uL (ref 4.0–10.5)

## 2015-06-30 LAB — TSH: TSH: 3.916 u[IU]/mL (ref 0.350–4.500)

## 2015-06-30 MED ORDER — AMIODARONE HCL 200 MG PO TABS: 200.0000 mg | ORAL_TABLET | Freq: Every day | ORAL | Status: DC

## 2015-06-30 NOTE — Patient Instructions (Signed)
DECREASE Amiodarone to 200mg  once daily.  Routine lab work today. Will notify you of abnormal results, otherwise no news is good news!  Follow up 6 weeks.  Do the following things EVERYDAY: 1) Weigh yourself in the morning before breakfast. Write it down and keep it in a log. 2) Take your medicines as prescribed 3) Eat low salt foods-Limit salt (sodium) to 2000 mg per day.  4) Stay as active as you can everyday 5) Limit all fluids for the day to less than 2 liters

## 2015-07-01 ENCOUNTER — Telehealth (HOSPITAL_COMMUNITY): Payer: Self-pay | Admitting: Cardiology

## 2015-07-01 MED ORDER — LOSARTAN POTASSIUM 100 MG PO TABS: 50.0000 mg | ORAL_TABLET | Freq: Every day | ORAL | Status: DC

## 2015-07-01 NOTE — Telephone Encounter (Signed)
Pt aware and voiced understanding Lab appt 7/16 @ 1130am

## 2015-07-01 NOTE — Progress Notes (Signed)
Patient ID: Andrew [name], male   DOB: [birth year], 81 y.o.   MRN: [id number] PCP: Dr. Ronnald Ramp  81 yo with history of CKD, CAD s/p CABG, and ischemic cardiomyopathy with primarily diastolic CHF presents for cardiology followup.  He has a history of PAD followed at [facility].  I took him for [facility] in 11/15. 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  He had had occlusion of a relatively small LAD, there were right to left collaterals and no intervention was done (medical management).    He and his wife went on a cruise along the [employer] in 5/16.  He admits to considerable dietary indiscretion (high sodium diet).  It appears that he developed a hypertensive crisis along with chest pain while on the ship. He went into atrial fibrillation and developed CHF.  He was taken to the hospital in [place], [place].  He was in the ICU for about a week on Bipap intermittently.  Troponin peaked at 0.09 during this admission.  He was diuresed and after about 2 wks left the hospital and was able to fly home.    At last appointment, Andrew [name] was back in atrial fibrillation and appeared more volume overloaded.  He was started on amiodarone and Lasix was increased to 40 mg bid.  He is back in NSR today.  Breathing is improved. He is not short of breath on flat ground.  He can work in his yard for up to 2 hours at a time.  Weight down 3 lbs.  No chest pain.  No lightheadedness.  No BRBPR/melena.   He gets pain in his calves and thighs now only with walking up hills.  No problems on flat ground.  He had echo (6/16) showing EF 50-55%, mild LVH.  Cardiolite (6/16) showed EF 42%, prior anterolateral MI, no ischemia.  He cancelled his sleep study.   ECG: NSR, 1st degree AVB, IVCD 126 msec, old anterior MI   Labs (12/14): K 4.8, creatinine 1.5 Labs (5/15): LDL 92 Labs (11/15): K 4.1, creatinine 1.5 Labs (12/15): K 4, creatinine 1.5 Labs (1/16): LFTs normal, LDL 51, HDL 47 Labs (5/16): TnI 0.09 Labs (6/16): K 4.8 => 4.4,  creatinine 1.79 => 2.26, HCT 28.7  PMH: 1. CKD 2. HTN 3. Hyperlipidemia 4. AAA: s/p resection with left renal artery bypass in 1997 5. GERD 6. IBS 7. Melanoma s/p reception 8. Diabetes: Diet-controlled 9. Carotid stenosis: Carotid dopplers (4/15) with 40-59% bilateral stenosis.  10. PAD: 9/14 ABIs 0.91 right 1.09 left.  11. CAD: Has super-dominant RCA. s/p CABG 1992.  He had Taxus DES to mid PDA in 5/02. LHC (1/05) with 90% ostial PDA (had DES to this vessel), totally occluded RIMA-PDA, totally occluded PLV, sequential SVG-PLV with 1 branch occluded.  LHC (11/15) with left main off the right cusp, 40-50% ostial left main, total occlusion of the proximal LAD (small vessel) with right to left collaterals, large super-dominant RCA with 50% ISR mid RCA, 50% ostial PDA, patent SVG-PLV, RIMA-PDA known atretic. Lexiscan Cardiolite (6/16) with EF 42%, prior anterolateral MI, no ischemia.  12. Ischemic cardiomyopathy: Echo (12/09) with EF 45-50%, basal to mid inferolateral hypokinesis. Echo (6/15) with EF 45-50%, akinesis of the mid to apical inferolateral wall.  Echo (6/16) with EF 50-55%, mild LVH, inferior hypokinesis, mild Andrew.  13. OA 14. Atrial fibrillation: Paroxysmal.  Noted during 5/16 hospitalization in [place] and again in 6/16.   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  FH: Brother with CAD.   ROS: All systems reviewed and negative except as per HPI.   Current Outpatient Prescriptions  Medication Sig Dispense Refill  . ALPRAZolam (XANAX) 0.5 MG tablet Take 0.5 tablets (0.25 mg total) by mouth 2 (two) times daily as needed for anxiety. 60 tablet 5  . amiodarone (PACERONE) 200 MG tablet Take 1 tablet (200 mg total) by mouth daily. 30 tablet 6  . apixaban (ELIQUIS) 2.5 MG TABS tablet Take 1 tablet (2.5 mg total) by mouth 2 (two) times daily. 60 tablet 3  . aspirin EC 81 MG tablet Take 1 tablet (81 mg total) by mouth daily.    Marland Kitchen  atorvastatin (LIPITOR) 80 MG tablet Take 1 tablet (80 mg total) by mouth daily. 30 tablet 6  . cilostazol (PLETAL) 100 MG tablet TAKE 1 TABLET TWICE DAILY. 180 tablet 3  . erythromycin with ethanol (THERAMYCIN) 2 % external solution Apply 1 application topically daily as needed (for skin irritation around neck).    . ezetimibe (ZETIA) 10 MG tablet Take 10 mg by mouth daily.    . fenofibrate (TRICOR) 145 MG tablet TAKE 1 TABLET ONCE DAILY. 90 tablet 1  . FLUoxetine (PROZAC) 20 MG capsule Take 1 capsule (20 mg total) by mouth daily. 90 capsule 3  . furosemide (LASIX) 40 MG tablet Take 1 tablet (40 mg total) by mouth 2 (two) times daily. 60 tablet 3  . glucosamine-chondroitin 500-400 MG tablet Take 1 tablet by mouth daily.     . Hypromellose (GENTEAL MILD) 0.2 % SOLN Place 1 drop into both eyes daily as needed (for dry eyes).     Marland Kitchen losartan (COZAAR) 100 MG tablet TAKE 1 TABLET ONCE DAILY. 90 tablet 3  . Misc Natural Products (NF FORMULAS TESTOSTERONE) CAPS Take 1 capsule by mouth 2 (two) times daily.    . Multiple Vitamin (MULTIVITAMIN) tablet Take 1 tablet by mouth daily.    Marland Kitchen NIFEdipine (PROCARDIA-XL/ADALAT-CC/NIFEDICAL-XL) 30 MG 24 hr tablet Take 30 mg by mouth daily.    . nitroGLYCERIN (NITROSTAT) 0.4 MG SL tablet Place 0.4 mg under the tongue every 5 (five) minutes as needed for chest pain.    Marland Kitchen omega-3 acid ethyl esters (LOVAZA) 1 G capsule Take 1 g by mouth 2 (two) times daily.    . potassium gluconate 595 MG TABS Take 595 mg by mouth daily.    . tamsulosin (FLOMAX) 0.4 MG CAPS capsule Take 0.4 mg by mouth daily after supper.    . temazepam (RESTORIL) 30 MG capsule TAKE 1 CAPSULE AT BEDTIME AS NEEDED FOR SLEEP. 30 capsule 5   No current facility-administered medications for this encounter.   BP 111/53 mmHg  Pulse 73  Resp 18  Wt 183 lb 8 oz (83.235 kg)  SpO2 92% General: NAD Neck: JVP not elevated, no thyromegaly or thyroid nodule.  Lungs: Clear to auscultation bilaterally with normal  respiratory effort. CV: Nondisplaced PMI.  Heart irregular S1/S2, no S3/S4, 2/6 early SEM RUSB.  Trace ankle edema.  Right carotid bruit.  Difficult to palpate pedal pulses.  Abdomen: Soft, nontender, no hepatosplenomegaly, no distention.  Skin: Intact without lesions or rashes.  Neurologic: Alert and oriented x 3.  Psych: Normal affect. Extremities: No clubbing or cyanosis.   Assessment/Plan:  1. CAD: s/p CABG.  Cath in 11/15 showed occluded LAD with left to right collaterals.  The LAD was a relatively small vessel (super-dominant right).  I managed him medically. He had chest pain in the setting  of his recent admission to hospital in [place] with CHF exacerbation.  No chest pain since he has been home. Lexiscan Cardiolite in 6/16 with infarction but no ischemia.  - Continue ASA 81.   - Continue statin, ARB.  2. Ischemic cardiomyopathy with primarily diastolic CHF: EF [id number] on last echo in 6/16.  He was admitted to a hospital in [place] in 5/16 with CHF exacerbation in the setting of atrial fibrillation and chest pain.  NYHA class II symptoms currently, volume status looks better.  Creatinine did increase with increase in Lasix.  - Continue Lasix to 40 mg bid.  BMET today.  3. Carotid stenosis: Due for repeat carotid dopplers, to be done at [facility].  4. PAD: Followed at [facility].  I stopped cilostazol due to CHF and starting Eliquis.  However, off cilostazol, he developed very severe claudication.  Although not ideal, given quality of life concerns I restarted cilostazol, and he feels much better.  5. Hyperlipidemia: Good lipids in 1/16.  6. CKD: BMET now. If creatinine continues to be above baseline, I may cut back on losartan.  7. Atrial fibrillation: He is back in NSR today.  Atrial fibrillation may have been contributing to worsened CHF at last appointment.    - Decrease amiodarone to 200 mg daily.  Check LFTs and TSH today.  Will need to get regular eye exams.  - Continue Eliquis.  8. Suspected OSA:  He cancelled his sleep study.  Followup in 6 wks    Loralie Champagne 07/01/2015

## 2015-07-01 NOTE — Telephone Encounter (Signed)
-----   Message from Larey Dresser, MD sent at 06/30/2015  5:30 PM EDT ----- Please call Mr Borsellino, have him lower his losartan to 50 mg daily (from 100 mg daily).  Stop KCl supplement for now.  Repeat BMET in 1 week.

## 2015-07-05 ENCOUNTER — Ambulatory Visit (INDEPENDENT_AMBULATORY_CARE_PROVIDER_SITE_OTHER): Payer: Medicare PPO | Admitting: Family Medicine

## 2015-07-05 ENCOUNTER — Ambulatory Visit (INDEPENDENT_AMBULATORY_CARE_PROVIDER_SITE_OTHER): Payer: Medicare PPO

## 2015-07-05 VITALS — BP 138/58 | HR 89 | Temp 99.2°F | Resp 16 | Ht 66.0 in | Wt 180.6 lb

## 2015-07-05 DIAGNOSIS — M5440 Lumbago with sciatica, unspecified side: Secondary | ICD-10-CM

## 2015-07-05 DIAGNOSIS — R509 Fever, unspecified: Secondary | ICD-10-CM

## 2015-07-05 DIAGNOSIS — R8281 Pyuria: Secondary | ICD-10-CM

## 2015-07-05 DIAGNOSIS — N390 Urinary tract infection, site not specified: Secondary | ICD-10-CM | POA: Diagnosis not present

## 2015-07-05 LAB — POCT URINALYSIS DIPSTICK
Glucose, UA: NEGATIVE
Ketones, UA: NEGATIVE
Nitrite, UA: NEGATIVE
Protein, UA: 100
Spec Grav, UA: 1.02
Urobilinogen, UA: 0.2
pH, UA: 5.5

## 2015-07-05 LAB — CBC
HCT: 29.7 % — ABNORMAL LOW (ref 39.0–52.0)
Hemoglobin: 10.1 g/dL — ABNORMAL LOW (ref 13.0–17.0)
MCH: 31.6 pg (ref 26.0–34.0)
MCHC: 34 g/dL (ref 30.0–36.0)
MCV: 92.8 fL (ref 78.0–100.0)
MPV: 8.8 fL (ref 8.6–12.4)
Platelets: 194 10*3/uL (ref 150–400)
RBC: 3.2 MIL/uL — ABNORMAL LOW (ref 4.22–5.81)
RDW: 13.4 % (ref 11.5–15.5)
WBC: 13.9 10*3/uL — ABNORMAL HIGH (ref 4.0–10.5)

## 2015-07-05 LAB — POCT UA - MICROSCOPIC ONLY
Casts, Ur, LPF, POC: NEGATIVE
Crystals, Ur, HPF, POC: NEGATIVE
Mucus, UA: NEGATIVE
Yeast, UA: NEGATIVE

## 2015-07-05 LAB — POCT SEDIMENTATION RATE: POCT SED RATE: 128 mm/hr — AB (ref 0–22)

## 2015-07-05 MED ORDER — LEVOFLOXACIN 500 MG PO TABS: 500.0000 mg | ORAL_TABLET | Freq: Every day | ORAL | Status: DC

## 2015-07-05 MED ORDER — METHYLPREDNISOLONE ACETATE 80 MG/ML IJ SUSP
80.0000 mg | Freq: Once | INTRAMUSCULAR | Status: AC
  Administered 2015-07-05: 80 mg via INTRAMUSCULAR

## 2015-07-05 MED ORDER — METHYLPREDNISOLONE ACETATE 40 MG/ML INJ SUSP (RADIOLOG: 80.0000 mg | Freq: Once | INTRAMUSCULAR | Status: DC

## 2015-07-05 NOTE — Addendum Note (Signed)
Addended by: Robyn Haber on: 07/05/2015 10:38 AM   Modules accepted: Orders

## 2015-07-05 NOTE — Progress Notes (Signed)
Subjective:    Patient ID: Andrew [name], male    DOB: 02-Jul-1928, 81 y.o.   MRN: [id number] This chart was scribed for Andrew Haber, MD by Zola Button, Medical Scribe. This patient was seen in Room 4 and the patient's care was started at 9:42 AM.   HPI HPI Comments: SOUMIL [name] is a 81 y.o. male with a hx of CHF, atrial fibrillation, and peripheral vascular disease who presents to the [facility] complaining of intermittent fever and chills that started 2 days ago. Patient was sent here by his cardiologist, Dr. [name], to have testing done. He does have urinary frequency at baseline due to taking Lasix. He denies any other urinary or bowel symptoms.  Patient has also had some mild abdominal pain. He does not have much pain currently. He denies nausea.  Patient also reports having aching, non-radiating, lower back pain, worse on the right side, that started 2 months ago. He had congestive heart failure while he was on a cruise at that time.   Patient is partially retired. 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.  Review of Systems  Constitutional: Positive for fever and chills.  Gastrointestinal: Positive for abdominal pain. Negative for vomiting, diarrhea and constipation.  Genitourinary: Positive for frequency. Negative for hematuria.  Musculoskeletal: Positive for back pain.       Objective:   Physical Exam CONSTITUTIONAL: Well developed/well nourished HEAD: Normocephalic/atraumatic EYES: EOM/PERRL ENMT: Mucous membranes moist NECK: supple no meningeal signs SPINE: entire spine nontender CV: S1/S2 noted, no murmurs/rubs/gallops noted LUNGS: Bibasilar rales ABDOMEN: Umbilical hernia, nontender with no masses palpated, no guarding or rebound GU: no cva tenderness NEURO: Pt is awake/alert, moves all extremitiesx4, he moves slowly when getting up from a lying position and seemed to have stiffness in  his back. EXTREMITIES: pulses normal, full ROM, no edema SKIN: warm, color normal PSYCH: no abnormalities of mood noted  [facility] (PRIMARY) x-ray report read by Dr. Joseph Art: Chest film shows hazy density at the left lower lobe. There is also suggestion of some right lower lobe atelectasis. No definite infiltrate is seen. Lumbar spine shows marked spondylosis and mild scoliosis. There are no erosions of the vertebral bodies.  Results for orders placed or performed in visit on 07/05/15  POCT UA - Microscopic Only  Result Value Ref Range   WBC, Ur, HPF, POC 15-20    RBC, urine, microscopic 8-10    Bacteria, U Microscopic 3+    Mucus, UA neg    Epithelial cells, urine per micros 2-3    Crystals, Ur, HPF, POC neg    Casts, Ur, LPF, POC neg    Yeast, UA neg   POCT urinalysis dipstick  Result Value Ref Range   Color, UA yellow    Clarity, UA cloudy    Glucose, UA neg    Bilirubin, UA small    Ketones, UA neg    Spec Grav, UA 1.020    Blood, UA mod    pH, UA 5.5    Protein, UA 100    Urobilinogen, UA 0.2    Nitrite, UA neg    Leukocytes, UA large (3+) (A) Negative       Assessment & Plan:  Patient's symptoms and signs are consistent with a urinary tract infection. He has the pyuria, back pain, fever and chills. The x-ray does not suggest a pneumonia. Nevertheless, patient does have some rales in  the bases so covering for early pneumonia is also indicated.  Although there is a drug interaction with amiodarone, I feel Levaquin is indicated because benefits outweigh risks.  This chart was scribed in my presence and reviewed by me personally.    ICD-9-CM ICD-10-CM   1. Fever and chills 780.60 R50.9 CBC     POCT UA - Microscopic Only     POCT urinalysis dipstick     DG Chest 2 View     DG Lumbar Spine 2-3 Views     POCT SEDIMENTATION RATE     Urine culture     levofloxacin (LEVAQUIN) 500 MG tablet  2. Midline low back pain with sciatica, sciatica laterality unspecified 724.3  M54.40 DG Lumbar Spine 2-3 Views     Urine culture     levofloxacin (LEVAQUIN) 500 MG tablet  3. Pyuria 791.9 N39.0 Urine culture     levofloxacin (LEVAQUIN) 500 MG tablet    Signed, Andrew Haber, MD   Andrew Haber, MD

## 2015-07-05 NOTE — Patient Instructions (Signed)
You have a urinary infection. This often happens tremendously get older because there is an infection in the bladder as well as the prostate. I want you to take the antibiotic will once a day. You need to have the urine rechecked in 3 days by your primary care provider to make sure that the infection is resolving.

## 2015-07-05 NOTE — Addendum Note (Signed)
Addended by: Ivor Reining on: 07/05/2015 10:50 AM   Modules accepted: Orders

## 2015-07-06 ENCOUNTER — Encounter (HOSPITAL_COMMUNITY): Payer: Self-pay

## 2015-07-06 ENCOUNTER — Inpatient Hospital Stay (HOSPITAL_COMMUNITY)
Admission: EM | Admit: 2015-07-06 | Discharge: 2015-07-12 | DRG: 689 | Disposition: A | Discharge status: Home Health | Payer: Medicare PPO | Attending: Internal Medicine | Admitting: Internal Medicine

## 2015-07-06 ENCOUNTER — Emergency Department (HOSPITAL_COMMUNITY): Payer: Medicare PPO

## 2015-07-06 DIAGNOSIS — N281 Cyst of kidney, acquired: Secondary | ICD-10-CM

## 2015-07-06 DIAGNOSIS — I480 Paroxysmal atrial fibrillation: Secondary | ICD-10-CM | POA: Diagnosis not present

## 2015-07-06 DIAGNOSIS — E860 Dehydration: Secondary | ICD-10-CM | POA: Diagnosis present

## 2015-07-06 DIAGNOSIS — J9601 Acute respiratory failure with hypoxia: Secondary | ICD-10-CM | POA: Diagnosis not present

## 2015-07-06 DIAGNOSIS — E876 Hypokalemia: Secondary | ICD-10-CM | POA: Diagnosis present

## 2015-07-06 DIAGNOSIS — Z8249 Family history of ischemic heart disease and other diseases of the circulatory system: Secondary | ICD-10-CM

## 2015-07-06 DIAGNOSIS — K760 Fatty (change of) liver, not elsewhere classified: Secondary | ICD-10-CM | POA: Diagnosis present

## 2015-07-06 DIAGNOSIS — I152 Hypertension secondary to endocrine disorders: Secondary | ICD-10-CM | POA: Diagnosis present

## 2015-07-06 DIAGNOSIS — R0600 Dyspnea, unspecified: Secondary | ICD-10-CM

## 2015-07-06 DIAGNOSIS — Z955 Presence of coronary angioplasty implant and graft: Secondary | ICD-10-CM

## 2015-07-06 DIAGNOSIS — Z951 Presence of aortocoronary bypass graft: Secondary | ICD-10-CM | POA: Diagnosis not present

## 2015-07-06 DIAGNOSIS — K219 Gastro-esophageal reflux disease without esophagitis: Secondary | ICD-10-CM | POA: Diagnosis present

## 2015-07-06 DIAGNOSIS — R911 Solitary pulmonary nodule: Secondary | ICD-10-CM | POA: Diagnosis present

## 2015-07-06 DIAGNOSIS — E1129 Type 2 diabetes mellitus with other diabetic kidney complication: Secondary | ICD-10-CM | POA: Diagnosis present

## 2015-07-06 DIAGNOSIS — Z79899 Other long term (current) drug therapy: Secondary | ICD-10-CM

## 2015-07-06 DIAGNOSIS — J441 Chronic obstructive pulmonary disease with (acute) exacerbation: Secondary | ICD-10-CM | POA: Diagnosis present

## 2015-07-06 DIAGNOSIS — N390 Urinary tract infection, site not specified: Secondary | ICD-10-CM | POA: Diagnosis present

## 2015-07-06 DIAGNOSIS — E1122 Type 2 diabetes mellitus with diabetic chronic kidney disease: Secondary | ICD-10-CM | POA: Diagnosis present

## 2015-07-06 DIAGNOSIS — D631 Anemia in chronic kidney disease: Secondary | ICD-10-CM | POA: Diagnosis present

## 2015-07-06 DIAGNOSIS — Z85820 Personal history of malignant melanoma of skin: Secondary | ICD-10-CM | POA: Diagnosis not present

## 2015-07-06 DIAGNOSIS — T502X5A Adverse effect of carbonic-anhydrase inhibitors, benzothiadiazides and other diuretics, initial encounter: Secondary | ICD-10-CM | POA: Diagnosis present

## 2015-07-06 DIAGNOSIS — Z7982 Long term (current) use of aspirin: Secondary | ICD-10-CM | POA: Diagnosis not present

## 2015-07-06 DIAGNOSIS — Z800 Family history of malignant neoplasm of digestive organs: Secondary | ICD-10-CM

## 2015-07-06 DIAGNOSIS — I10 Essential (primary) hypertension: Secondary | ICD-10-CM

## 2015-07-06 DIAGNOSIS — N2889 Other specified disorders of kidney and ureter: Secondary | ICD-10-CM | POA: Diagnosis present

## 2015-07-06 DIAGNOSIS — R1031 Right lower quadrant pain: Secondary | ICD-10-CM | POA: Diagnosis not present

## 2015-07-06 DIAGNOSIS — N179 Acute kidney failure, unspecified: Secondary | ICD-10-CM | POA: Diagnosis present

## 2015-07-06 DIAGNOSIS — Z833 Family history of diabetes mellitus: Secondary | ICD-10-CM

## 2015-07-06 DIAGNOSIS — E785 Hyperlipidemia, unspecified: Secondary | ICD-10-CM | POA: Diagnosis present

## 2015-07-06 DIAGNOSIS — M1990 Unspecified osteoarthritis, unspecified site: Secondary | ICD-10-CM | POA: Diagnosis present

## 2015-07-06 DIAGNOSIS — K5900 Constipation, unspecified: Secondary | ICD-10-CM

## 2015-07-06 DIAGNOSIS — N136 Pyonephrosis: Secondary | ICD-10-CM | POA: Diagnosis present

## 2015-07-06 DIAGNOSIS — R062 Wheezing: Secondary | ICD-10-CM

## 2015-07-06 DIAGNOSIS — E1169 Type 2 diabetes mellitus with other specified complication: Secondary | ICD-10-CM | POA: Diagnosis present

## 2015-07-06 DIAGNOSIS — K5901 Slow transit constipation: Secondary | ICD-10-CM | POA: Diagnosis not present

## 2015-07-06 DIAGNOSIS — N182 Chronic kidney disease, stage 2 (mild): Secondary | ICD-10-CM | POA: Diagnosis present

## 2015-07-06 DIAGNOSIS — R1030 Lower abdominal pain, unspecified: Secondary | ICD-10-CM | POA: Insufficient documentation

## 2015-07-06 DIAGNOSIS — Z8679 Personal history of other diseases of the circulatory system: Secondary | ICD-10-CM | POA: Diagnosis not present

## 2015-07-06 DIAGNOSIS — I129 Hypertensive chronic kidney disease with stage 1 through stage 4 chronic kidney disease, or unspecified chronic kidney disease: Secondary | ICD-10-CM | POA: Diagnosis present

## 2015-07-06 DIAGNOSIS — K589 Irritable bowel syndrome without diarrhea: Secondary | ICD-10-CM | POA: Diagnosis present

## 2015-07-06 DIAGNOSIS — I2510 Atherosclerotic heart disease of native coronary artery without angina pectoris: Secondary | ICD-10-CM | POA: Diagnosis present

## 2015-07-06 DIAGNOSIS — N200 Calculus of kidney: Secondary | ICD-10-CM

## 2015-07-06 DIAGNOSIS — K30 Functional dyspepsia: Secondary | ICD-10-CM | POA: Diagnosis present

## 2015-07-06 DIAGNOSIS — B9620 Unspecified Escherichia coli [E. coli] as the cause of diseases classified elsewhere: Secondary | ICD-10-CM | POA: Diagnosis present

## 2015-07-06 DIAGNOSIS — N189 Chronic kidney disease, unspecified: Secondary | ICD-10-CM | POA: Diagnosis not present

## 2015-07-06 DIAGNOSIS — Z87891 Personal history of nicotine dependence: Secondary | ICD-10-CM | POA: Diagnosis not present

## 2015-07-06 DIAGNOSIS — Z888 Allergy status to other drugs, medicaments and biological substances status: Secondary | ICD-10-CM | POA: Diagnosis not present

## 2015-07-06 DIAGNOSIS — I714 Abdominal aortic aneurysm, without rupture, unspecified: Secondary | ICD-10-CM | POA: Diagnosis present

## 2015-07-06 DIAGNOSIS — R509 Fever, unspecified: Secondary | ICD-10-CM

## 2015-07-06 DIAGNOSIS — I5032 Chronic diastolic (congestive) heart failure: Secondary | ICD-10-CM | POA: Diagnosis present

## 2015-07-06 DIAGNOSIS — I5033 Acute on chronic diastolic (congestive) heart failure: Secondary | ICD-10-CM

## 2015-07-06 DIAGNOSIS — E1159 Type 2 diabetes mellitus with other circulatory complications: Secondary | ICD-10-CM | POA: Diagnosis present

## 2015-07-06 DIAGNOSIS — R1011 Right upper quadrant pain: Secondary | ICD-10-CM

## 2015-07-06 LAB — COMPREHENSIVE METABOLIC PANEL
ALT: 27 U/L (ref 17–63)
AST: 28 U/L (ref 15–41)
Albumin: 3.4 g/dL — ABNORMAL LOW (ref 3.5–5.0)
Alkaline Phosphatase: 31 U/L — ABNORMAL LOW (ref 38–126)
Anion gap: 10 (ref 5–15)
BUN: 56 mg/dL — ABNORMAL HIGH (ref 6–20)
CO2: 23 mmol/L (ref 22–32)
Calcium: 8.9 mg/dL (ref 8.9–10.3)
Chloride: 100 mmol/L — ABNORMAL LOW (ref 101–111)
Creatinine, Ser: 2.75 mg/dL — ABNORMAL HIGH (ref 0.61–1.24)
GFR calc Af Amer: 23 mL/min — ABNORMAL LOW (ref >60)
GFR calc non Af Amer: 20 mL/min — ABNORMAL LOW (ref >60)
Glucose, Bld: 177 mg/dL — ABNORMAL HIGH (ref 65–99)
Potassium: 4.4 mmol/L (ref 3.5–5.1)
Sodium: 133 mmol/L — ABNORMAL LOW (ref 135–145)
Total Bilirubin: 0.7 mg/dL (ref 0.3–1.2)
Total Protein: 7.4 g/dL (ref 6.5–8.1)

## 2015-07-06 LAB — URINALYSIS, ROUTINE W REFLEX MICROSCOPIC
Bilirubin Urine: NEGATIVE
Glucose, UA: NEGATIVE mg/dL
Ketones, ur: NEGATIVE mg/dL
Nitrite: POSITIVE — AB
Protein, ur: 30 mg/dL — AB
Specific Gravity, Urine: 1.015 (ref 1.005–1.030)
Urobilinogen, UA: 0.2 mg/dL (ref 0.0–1.0)
pH: 6 (ref 5.0–8.0)

## 2015-07-06 LAB — PHOSPHORUS: Phosphorus: 2.2 mg/dL — ABNORMAL LOW (ref 2.5–4.6)

## 2015-07-06 LAB — CBC WITH DIFFERENTIAL/PLATELET
Basophils Absolute: 0 10*3/uL (ref 0.0–0.1)
Basophils Relative: 0 % (ref 0–1)
Eosinophils Absolute: 0 10*3/uL (ref 0.0–0.7)
Eosinophils Relative: 0 % (ref 0–5)
HCT: 27.5 % — ABNORMAL LOW (ref 39.0–52.0)
Hemoglobin: 9.1 g/dL — ABNORMAL LOW (ref 13.0–17.0)
Lymphocytes Relative: 3 % — ABNORMAL LOW (ref 12–46)
Lymphs Abs: 0.4 10*3/uL — ABNORMAL LOW (ref 0.7–4.0)
MCH: 31.6 pg (ref 26.0–34.0)
MCHC: 33.1 g/dL (ref 30.0–36.0)
MCV: 95.5 fL (ref 78.0–100.0)
Monocytes Absolute: 1.4 10*3/uL — ABNORMAL HIGH (ref 0.1–1.0)
Monocytes Relative: 10 % (ref 3–12)
Neutro Abs: 12.9 10*3/uL — ABNORMAL HIGH (ref 1.7–7.7)
Neutrophils Relative %: 87 % — ABNORMAL HIGH (ref 43–77)
Platelets: 182 10*3/uL (ref 150–400)
RBC: 2.88 MIL/uL — ABNORMAL LOW (ref 4.22–5.81)
RDW: 13.9 % (ref 11.5–15.5)
WBC: 14.7 10*3/uL — ABNORMAL HIGH (ref 4.0–10.5)

## 2015-07-06 LAB — URINE MICROSCOPIC-ADD ON

## 2015-07-06 LAB — MAGNESIUM: Magnesium: 2.2 mg/dL (ref 1.7–2.4)

## 2015-07-06 LAB — I-STAT CG4 LACTIC ACID, ED
Lactic Acid, Venous: 0.96 mmol/L (ref 0.5–2.0)
Lactic Acid, Venous: 0.37 mmol/L — ABNORMAL LOW (ref 0.5–2.0)
Lactic Acid, Venous: <0.3 mmol/L — ABNORMAL LOW (ref 0.5–2.0)

## 2015-07-06 LAB — LIPASE, BLOOD: Lipase: 18 U/L — ABNORMAL LOW (ref 22–51)

## 2015-07-06 MED ORDER — APIXABAN 2.5 MG PO TABS
2.5000 mg | ORAL_TABLET | Freq: Two times a day (BID) | ORAL | Status: DC
  Administered 2015-07-06 – 2015-07-12 (×12): 2.5 mg via ORAL
  Filled 2015-07-06 (×13): qty 1

## 2015-07-06 MED ORDER — CEFTRIAXONE SODIUM IN DEXTROSE 20 MG/ML IV SOLN
1.0000 g | INTRAVENOUS | Status: DC
  Administered 2015-07-07 – 2015-07-11 (×5): 1 g via INTRAVENOUS
  Filled 2015-07-06 (×6): qty 50

## 2015-07-06 MED ORDER — MORPHINE SULFATE 2 MG/ML IJ SOLN: 1.0000 mg | INTRAMUSCULAR | Status: DC | PRN

## 2015-07-06 MED ORDER — FENOFIBRATE 160 MG PO TABS
160.0000 mg | ORAL_TABLET | Freq: Every day | ORAL | Status: DC
  Administered 2015-07-06 – 2015-07-12 (×7): 160 mg via ORAL
  Filled 2015-07-06 (×7): qty 1

## 2015-07-06 MED ORDER — ALPRAZOLAM 0.25 MG PO TABS: 0.2500 mg | ORAL_TABLET | Freq: Two times a day (BID) | ORAL | Status: DC | PRN

## 2015-07-06 MED ORDER — SODIUM CHLORIDE 0.9 % IV BOLUS (SEPSIS)
1000.0000 mL | Freq: Once | INTRAVENOUS | Status: AC
  Administered 2015-07-06: 1000 mL via INTRAVENOUS

## 2015-07-06 MED ORDER — ATORVASTATIN CALCIUM 80 MG PO TABS
80.0000 mg | ORAL_TABLET | Freq: Every day | ORAL | Status: DC
  Administered 2015-07-06 – 2015-07-11 (×6): 80 mg via ORAL
  Filled 2015-07-06 (×7): qty 1

## 2015-07-06 MED ORDER — SODIUM CHLORIDE 0.9 % IV SOLN
INTRAVENOUS | Status: DC
  Administered 2015-07-06: 09:00:00 via INTRAVENOUS

## 2015-07-06 MED ORDER — NITROGLYCERIN 0.4 MG SL SUBL: 0.4000 mg | SUBLINGUAL_TABLET | SUBLINGUAL | Status: DC | PRN

## 2015-07-06 MED ORDER — CILOSTAZOL 100 MG PO TABS
100.0000 mg | ORAL_TABLET | Freq: Two times a day (BID) | ORAL | Status: DC
  Administered 2015-07-06 – 2015-07-12 (×12): 100 mg via ORAL
  Filled 2015-07-06 (×14): qty 1

## 2015-07-06 MED ORDER — AMIODARONE HCL 200 MG PO TABS
200.0000 mg | ORAL_TABLET | Freq: Every day | ORAL | Status: DC
  Administered 2015-07-06 – 2015-07-12 (×7): 200 mg via ORAL
  Filled 2015-07-06 (×7): qty 1

## 2015-07-06 MED ORDER — SODIUM CHLORIDE 0.9 % IV SOLN
INTRAVENOUS | Status: DC
  Administered 2015-07-06: 17:00:00 via INTRAVENOUS

## 2015-07-06 MED ORDER — HEPARIN SODIUM (PORCINE) 5000 UNIT/ML IJ SOLN
5000.0000 [IU] | Freq: Three times a day (TID) | INTRAMUSCULAR | Status: DC
  Filled 2015-07-06 (×2): qty 1

## 2015-07-06 MED ORDER — OMEGA-3-ACID ETHYL ESTERS 1 G PO CAPS
1.0000 g | ORAL_CAPSULE | Freq: Two times a day (BID) | ORAL | Status: DC
  Administered 2015-07-06 – 2015-07-12 (×12): 1 g via ORAL
  Filled 2015-07-06 (×13): qty 1

## 2015-07-06 MED ORDER — INSULIN ASPART 100 UNIT/ML ~~LOC~~ SOLN
0.0000 [IU] | Freq: Three times a day (TID) | SUBCUTANEOUS | Status: DC
  Administered 2015-07-07 (×2): 2 [IU] via SUBCUTANEOUS
  Administered 2015-07-07 – 2015-07-09 (×5): 3 [IU] via SUBCUTANEOUS
  Administered 2015-07-09: 2 [IU] via SUBCUTANEOUS
  Administered 2015-07-10: 3 [IU] via SUBCUTANEOUS
  Administered 2015-07-10 (×2): 2 [IU] via SUBCUTANEOUS
  Administered 2015-07-11: 11 [IU] via SUBCUTANEOUS
  Administered 2015-07-11: 5 [IU] via SUBCUTANEOUS
  Administered 2015-07-11: 2 [IU] via SUBCUTANEOUS
  Administered 2015-07-12: 5 [IU] via SUBCUTANEOUS
  Administered 2015-07-12: 3 [IU] via SUBCUTANEOUS

## 2015-07-06 MED ORDER — CEFTRIAXONE SODIUM 1 G IJ SOLR
1.0000 g | Freq: Once | INTRAMUSCULAR | Status: AC
  Administered 2015-07-06: 1 g via INTRAVENOUS
  Filled 2015-07-06: qty 10

## 2015-07-06 MED ORDER — OXYCODONE HCL 5 MG PO TABS
5.0000 mg | ORAL_TABLET | ORAL | Status: DC | PRN
  Administered 2015-07-06 – 2015-07-12 (×13): 5 mg via ORAL
  Filled 2015-07-06 (×14): qty 1

## 2015-07-06 MED ORDER — TAMSULOSIN HCL 0.4 MG PO CAPS
0.4000 mg | ORAL_CAPSULE | Freq: Every day | ORAL | Status: DC
  Administered 2015-07-06 – 2015-07-11 (×6): 0.4 mg via ORAL
  Filled 2015-07-06 (×7): qty 1

## 2015-07-06 MED ORDER — FLUOXETINE HCL 20 MG PO CAPS
20.0000 mg | ORAL_CAPSULE | Freq: Every day | ORAL | Status: DC
  Administered 2015-07-06 – 2015-07-12 (×7): 20 mg via ORAL
  Filled 2015-07-06 (×7): qty 1

## 2015-07-06 MED ORDER — NIFEDIPINE ER 30 MG PO TB24
30.0000 mg | ORAL_TABLET | Freq: Every day | ORAL | Status: DC
  Administered 2015-07-06 – 2015-07-12 (×7): 30 mg via ORAL
  Filled 2015-07-06 (×7): qty 1

## 2015-07-06 MED ORDER — SENNOSIDES-DOCUSATE SODIUM 8.6-50 MG PO TABS
1.0000 | ORAL_TABLET | Freq: Every evening | ORAL | Status: DC | PRN
  Administered 2015-07-09: 1 via ORAL
  Filled 2015-07-06: qty 1

## 2015-07-06 MED ORDER — EZETIMIBE 10 MG PO TABS
10.0000 mg | ORAL_TABLET | Freq: Every day | ORAL | Status: DC
  Administered 2015-07-06 – 2015-07-12 (×7): 10 mg via ORAL
  Filled 2015-07-06 (×7): qty 1

## 2015-07-06 MED ORDER — SODIUM CHLORIDE 0.9 % IV SOLN: INTRAVENOUS | Status: DC

## 2015-07-06 MED ORDER — ACETAMINOPHEN 325 MG PO TABS
650.0000 mg | ORAL_TABLET | Freq: Once | ORAL | Status: AC
  Administered 2015-07-06: 650 mg via ORAL
  Filled 2015-07-06: qty 2

## 2015-07-06 MED ORDER — ASPIRIN EC 81 MG PO TBEC
81.0000 mg | DELAYED_RELEASE_TABLET | Freq: Every day | ORAL | Status: DC
  Administered 2015-07-06 – 2015-07-12 (×7): 81 mg via ORAL
  Filled 2015-07-06 (×7): qty 1

## 2015-07-06 MED ORDER — ONDANSETRON HCL 4 MG/2ML IJ SOLN: 4.0000 mg | Freq: Three times a day (TID) | INTRAMUSCULAR | Status: DC | PRN

## 2015-07-06 NOTE — ED Provider Notes (Signed)
CSN: [id number]     Arrival date & time 07/06/15  0735 History   First MD Initiated Contact with Patient 07/06/15 0820     Chief Complaint  Patient presents with  . Abdominal Pain  . Back Pain  . Fever     (Consider location/radiation/quality/duration/timing/severity/associated sxs/prior Treatment) HPI   86yM with fever and generalized weakness. Onset about 3 days ago and progressively worsening. Seen at [facility] and diagnosed with UTI. Prescribed levaquin. Reports pharmacy would not fill it though because of potential interaction with amiodarone. Feels generally weak. Thirsty. Mild nausea. No vomiting. No specific urinary complaints. No diarrhea. Mid abdominal pain.   Past Medical History  Diagnosis Date  . CAD (coronary artery disease)   . LVF (left ventricular failure)   . Carotid artery stenosis   . Hyperlipidemia   . Hypertension   . GERD (gastroesophageal reflux disease)   . IBS (irritable bowel syndrome)   . Arthritis   . Diabetes mellitus   . Thyroid disease   . Anemia   . Vitamin D deficiency   . Depression   . Fatty liver 2008  . Cancer     Melanoma - Back  . AAA (abdominal aortic aneurysm)   . Pneumonia Jan. 2014  . CHF (congestive heart failure)   . DVT (deep venous thrombosis)    Past Surgical History  Procedure Laterality Date  . Total knee arthroplasty      bilateral- Pt fell 3 steps  . Cardiac stents  2005  . Coronary artery bypass graft  1992  . Joint replacement      Bilateral knee arthroplasty  . Eye surgery      Catarart  . Abdominal aortic aneurysm repair  2002  . Cardiac catheterization    . Left heart catheterization with coronary angiogram N/A 10/30/2014    Procedure: LEFT HEART CATHETERIZATION WITH CORONARY ANGIOGRAM;  Surgeon: Larey Dresser, MD;  Location: [facility] CATH LAB;  Service: Cardiovascular;  Laterality: N/A;   Family History  Problem Relation Age of Onset  . Colon cancer Father   . Cancer Father   . Coronary artery disease Brother    . Diabetes Brother   . Cancer Brother     throat and abdominal  . Heart disease Brother   . Hyperlipidemia Brother   . Peripheral vascular disease Brother     Varicose Veins  . Diabetes Son   . Hyperlipidemia Son    History  Substance Use Topics  . 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 Smokeless tobacco: Never Used     Comment: quit atleast 25 yrs ago, per pt  . Alcohol Use: 1.8 oz/week    3 Shots of liquor per week     Comment: socially    Review of Systems  All systems reviewed and negative, other than as noted in HPI.   Allergies  Metoprolol and Metformin and related  Home Medications   Prior to Admission medications   Medication Sig Start Date End Date Taking? Authorizing Provider  ALPRAZolam Duanne Moron) 0.5 MG tablet Take 0.5 tablets (0.25 mg total) by mouth 2 (two) times daily as needed for anxiety. 04/16/15   Janith Lima, MD  amiodarone (PACERONE) 200 MG tablet Take 1 tablet (200 mg total) by mouth daily. 06/30/15   Larey Dresser, MD  apixaban (ELIQUIS) 2.5 MG TABS tablet Take 1 tablet (2.5 mg total) by mouth 2 (two) times daily. 05/27/15   Larey Dresser, MD  aspirin EC 81 MG tablet Take  1 tablet (81 mg total) by mouth daily. 05/22/14   Larey Dresser, MD  atorvastatin (LIPITOR) 80 MG tablet Take 1 tablet (80 mg total) by mouth daily. 11/28/14   Larey Dresser, MD  cilostazol (PLETAL) 100 MG tablet TAKE 1 TABLET TWICE DAILY. 06/22/15   Janith Lima, MD  erythromycin with ethanol [facility]) 2 % external solution Apply 1 application topically daily as needed (for skin irritation around neck).    Historical Provider, MD  ezetimibe (ZETIA) 10 MG tablet Take 10 mg by mouth daily.    Historical Provider, MD  fenofibrate (TRICOR) 145 MG tablet TAKE 1 TABLET ONCE DAILY. 04/01/15   Janith Lima, MD  FLUoxetine (PROZAC) 20 MG capsule Take 1 capsule (20 mg total) by mouth daily. 03/05/15   Janith Lima, MD  furosemide (LASIX) 40 MG tablet Take 1 tablet (40 mg total) by mouth 2  (two) times daily. 06/12/15   Larey Dresser, MD  glucosamine-chondroitin 500-400 MG tablet Take 1 tablet by mouth daily.     Historical Provider, MD  Hypromellose (GENTEAL MILD) 0.2 % SOLN Place 1 drop into both eyes daily as needed (for dry eyes).     Historical Provider, MD  levofloxacin (LEVAQUIN) 500 MG tablet Take 1 tablet (500 mg total) by mouth daily. 07/05/15   Robyn Haber, MD  losartan (COZAAR) 100 MG tablet Take 0.5 tablets (50 mg total) by mouth daily. 07/01/15   Larey Dresser, MD  Misc Natural Products (NF FORMULAS TESTOSTERONE) CAPS Take 1 capsule by mouth 2 (two) times daily.    Historical Provider, MD  Multiple Vitamin (MULTIVITAMIN) tablet Take 1 tablet by mouth daily.    Historical Provider, MD  NIFEdipine (PROCARDIA-XL/ADALAT-CC/NIFEDICAL-XL) 30 MG 24 hr tablet Take 30 mg by mouth daily.    Historical Provider, MD  nitroGLYCERIN (NITROSTAT) 0.4 MG SL tablet Place 0.4 mg under the tongue every 5 (five) minutes as needed for chest pain.    Historical Provider, MD  omega-3 acid ethyl esters (LOVAZA) 1 G capsule Take 1 g by mouth 2 (two) times daily.    Historical Provider, MD  tamsulosin (FLOMAX) 0.4 MG CAPS capsule Take 0.4 mg by mouth daily after supper.    Historical Provider, MD  temazepam (RESTORIL) 30 MG capsule TAKE 1 CAPSULE AT BEDTIME AS NEEDED FOR SLEEP. 02/20/15   Janith Lima, MD   BP 123/59 mmHg  Pulse 99  Temp(Src) 101 F (38.3 C) (Oral)  Resp 26  Ht 5\' 8"  (1.727 m)  Wt 178 lb (80.74 kg)  BMI 27.07 kg/m2  SpO2 96% Physical Exam  Constitutional: He is oriented to person, place, and time. He appears well-developed and well-nourished. No distress.  HENT:  Head: Normocephalic and atraumatic.  Eyes: Conjunctivae are normal. Right eye exhibits no discharge. Left eye exhibits no discharge.  Neck: Neck supple.  Cardiovascular: Regular rhythm and normal heart sounds.  Exam reveals no gallop and no friction rub.   No murmur heard. Mild tachcyardia   Pulmonary/Chest: Effort normal and breath sounds normal. No respiratory distress.  Abdominal: Soft. He exhibits no distension. There is tenderness.  Tenderness RUQ  Musculoskeletal: He exhibits no edema or tenderness.  Neurological: He is alert and oriented to person, place, and time.  Skin: Skin is warm and dry.  Psychiatric: He has a normal mood and affect. His behavior is normal. Thought content normal.  Nursing note and vitals reviewed.   ED Course  Procedures (including critical care time) Labs Review  Labs Reviewed  CBC WITH DIFFERENTIAL/PLATELET - Abnormal; Notable for the following:    WBC 14.7 (*)    RBC 2.88 (*)    Hemoglobin 9.1 (*)    HCT 27.5 (*)    Neutrophils Relative % 87 (*)    Neutro Abs 12.9 (*)    Lymphocytes Relative 3 (*)    Lymphs Abs 0.4 (*)    Monocytes Absolute 1.4 (*)    All other components within normal limits  CULTURE, BLOOD (ROUTINE X 2)  CULTURE, BLOOD (ROUTINE X 2)  COMPREHENSIVE METABOLIC PANEL  URINALYSIS, ROUTINE W REFLEX MICROSCOPIC (NOT AT [facility])  LIPASE, BLOOD  I-STAT CG4 LACTIC ACID, ED    Imaging Review Dg Chest 2 View  07/05/2015   CLINICAL DATA:  Fever and chills for 2 days.  EXAM: CHEST  2 VIEW  COMPARISON:  04/04/2013 and prior chest radiographs  FINDINGS: The cardiomediastinal silhouette is unremarkable.  CABG changes again noted.  Mild bibasilar scarring and elevation of the right hemidiaphragm are unchanged.  There is no evidence of focal airspace disease, pulmonary edema, suspicious pulmonary nodule/mass, pleural effusion, or pneumothorax. No acute bony abnormalities are identified.  IMPRESSION: No active cardiopulmonary disease.   Electronically Signed   By: Margarette Canada M.D.   On: 07/05/2015 11:53   Dg Lumbar Spine 2-3 Views  07/05/2015   CLINICAL DATA:  Two month history of back pain.  EXAM: LUMBAR SPINE - 2-3 VIEW  COMPARISON:  CT scan from 2008.  FINDINGS: Severe degenerative lumbar spondylosis with severe multilevel disc  disease and facet disease. This appears progressive since the prior CT scan. There is also chronic L5 spondylolisthesis due to bilateral pars defects. No acute bony findings. Moderate aortoiliac calcifications are noted without definite aneurysm.  IMPRESSION: Severe degenerative lumbar spondylosis with severe multilevel disc disease and facet disease.  Bilateral pars defects at L5 with a grade 1 spondylolisthesis.  No acute bony findings.   Electronically Signed   By: Marijo Sanes M.D.   On: 07/05/2015 11:56   US Abdomen Limited Ruq  07/06/2015   CLINICAL DATA:  Right upper quadrant pain x3 weeks, intermittent fever x5 days  EXAM: US ABDOMEN LIMITED - RIGHT UPPER QUADRANT  COMPARISON:  CT abdomen pelvis dated 01/08/2007  FINDINGS: Gallbladder:  5 mm gallstone in the gallbladder neck. No gallbladder wall thickening or pericholecystic fluid. Negative sonographic Murphy's sign.  Common bile duct:  Diameter: 3 mm  Liver:  No focal lesion identified. Within normal limits in parenchymal echogenicity.  Additional comments:  Severe right hydronephrosis, chronic.  IMPRESSION: 5 mm gallstone, without associated sonographic findings suggest acute cholecystitis.  Severe right hydronephrosis, chronic.   Electronically Signed   By: Julian Hy M.D.   On: 07/06/2015 10:26     EKG Interpretation None      MDM   Final diagnoses:  UTI (lower urinary tract infection)  AKI (acute kidney injury)  Dehydration    86yM with fever and generalized weakness. UA from yesterday reviewed. Likely UTI. Rocephin, urine culture. No specific respiratory complaints. CXR from yesterday w/o acute abnormality. AKI. Baseline Cr seems to be around 1.7 - 1.8. IVF. RUQ pain which may simply be from cholelithiasis. No [place] evidence of cholecystitis.  Will discuss with medicine for admission.     Virgel Manifold, MD 07/06/15 1039

## 2015-07-06 NOTE — ED Notes (Signed)
Resting quietly with eye closed. Easily arousable. Verbally responsive. Resp even and unlabored. ABC's intact. IV x2 patent and intact. NAD noted. Family at bedside.

## 2015-07-06 NOTE — H&P (Signed)
[facility] History and Physical  Andrew [name] [id number] DOB: [birth year] DOA: 07/06/2015  Referring physician: Wilson Singer PCP: [name], MD   Chief Complaint: abdominal discomfort  HPI: Andrew [name] is a 81 y.o. male  Patient is an 81 year old Caucasian male with history of atrial fibrillation, congestive heart failure on ARB and Lasix, GERD, abdominal aortic aneurysm, hyperlipidemia, chronic renal insufficiency stage II. Who presents to the hospital complaining of 2 weeks of abdominal discomfort. He noticed that the pain was worse within the last week. Nothing he is aware of makes it better or worse and the pain is persistent. The pain is located in his right lower quadrant does not radiate. The pain has gradually gotten worse as a result patient presented to the ED for further evaluation recommendations.  While in the ED patient was noticed to have an elevated serum creatinine, elevated white blood cell count, and urinalysis suspicious for UTI. We were subsequently consulted for further medical evaluation recommendations.   Review of Systems:  Constitutional:  No weight loss, night sweats, + Fevers, chills, fatigue.  HEENT:  No headaches, Difficulty swallowing,Tooth/dental problems,Sore throat,  No sneezing, itching, ear ache, nasal congestion, post nasal drip,  Cardio-vascular:  No chest pain, Orthopnea, PND, swelling in lower extremities, anasarca, dizziness, palpitations  GI:  No heartburn, indigestion, + abdominal pain, nausea, vomiting, diarrhea, change in bowel habits, loss of appetite  Resp:  No shortness of breath with exertion or at rest. No excess mucus, no productive cough, No non-productive cough, No coughing up of blood.No change in color of mucus.No wheezing.No chest wall deformity  Skin:  no rash or lesions.  GU:  no dysuria, change in color of urine, no urgency or frequency. No flank pain.  Musculoskeletal:  No joint pain or swelling. No decreased  range of motion. No back pain.  Psych:  No change in mood or affect. No depression or anxiety. No memory loss.   Past Medical History  Diagnosis Date  . CAD (coronary artery disease)   . LVF (left ventricular failure)   . Carotid artery stenosis   . Hyperlipidemia   . Hypertension   . GERD (gastroesophageal reflux disease)   . IBS (irritable bowel syndrome)   . Arthritis   . Diabetes mellitus   . Thyroid disease   . Anemia   . Vitamin D deficiency   . Depression   . Fatty liver 2008  . Cancer     Melanoma - Back  . AAA (abdominal aortic aneurysm)   . Pneumonia Jan. 2014  . CHF (congestive heart failure)   . DVT (deep venous thrombosis)    Past Surgical History  Procedure Laterality Date  . Total knee arthroplasty      bilateral- Pt fell 3 steps  . Cardiac stents  2005  . Coronary artery bypass graft  1992  . Joint replacement      Bilateral knee arthroplasty  . Eye surgery      Catarart  . Abdominal aortic aneurysm repair  2002  . Cardiac catheterization    . Left heart catheterization with coronary angiogram N/A 10/30/2014    Procedure: LEFT HEART CATHETERIZATION WITH CORONARY ANGIOGRAM;  Surgeon: Larey Dresser, MD;  Location: [facility] CATH LAB;  Service: Cardiovascular;  Laterality: N/A;   Social History:  reports that he has quit smoking. He has never used smokeless tobacco. He reports that he drinks about 1.8 oz of alcohol per week. He reports that he does not use illicit drugs.  Allergies  Allergen Reactions  . Metoprolol Shortness Of Breath and Palpitations    Heart starts racing. Shallow breathing   . Metformin And Related Nausea And Vomiting    Family History  Problem Relation Age of Onset  . Colon cancer Father   . Cancer Father   . Coronary artery disease Brother   . Diabetes Brother   . Cancer Brother     throat and abdominal  . Heart disease Brother   . Hyperlipidemia Brother   . Peripheral vascular disease Brother     Varicose Veins  . Diabetes  Son   . Hyperlipidemia Son     Prior to Admission medications   Medication Sig Start Date End Date Taking? Authorizing Provider  acetaminophen (TYLENOL) 500 MG tablet Take 1,000 mg by mouth 3 (three) times daily.   Yes Historical Provider, MD  ALPRAZolam Duanne Moron) 0.5 MG tablet Take 0.5 tablets (0.25 mg total) by mouth 2 (two) times daily as needed for anxiety. 04/16/15  Yes Janith Lima, MD  amiodarone (PACERONE) 200 MG tablet Take 1 tablet (200 mg total) by mouth daily. 06/30/15  Yes Larey Dresser, MD  apixaban (ELIQUIS) 2.5 MG TABS tablet Take 1 tablet (2.5 mg total) by mouth 2 (two) times daily. 05/27/15  Yes Larey Dresser, MD  aspirin 325 MG tablet Take 650 mg by mouth every 4 (four) hours as needed for fever.   Yes Historical Provider, MD  aspirin EC 81 MG tablet Take 1 tablet (81 mg total) by mouth daily. 05/22/14  Yes Larey Dresser, MD  atorvastatin (LIPITOR) 80 MG tablet Take 1 tablet (80 mg total) by mouth daily. 11/28/14  Yes Larey Dresser, MD  cilostazol (PLETAL) 100 MG tablet TAKE 1 TABLET TWICE DAILY. 06/22/15  Yes Janith Lima, MD  Coenzyme Q10 (CO Q 10 PO) Take 1 tablet by mouth daily.   Yes Historical Provider, MD  erythromycin with ethanol (THERAMYCIN) 2 % external solution Apply 1 application topically daily as needed (for skin irritation around neck).   Yes Historical Provider, MD  ezetimibe (ZETIA) 10 MG tablet Take 10 mg by mouth daily.   Yes Historical Provider, MD  fenofibrate (TRICOR) 145 MG tablet TAKE 1 TABLET ONCE DAILY. 04/01/15  Yes Janith Lima, MD  FLUoxetine (PROZAC) 20 MG capsule Take 1 capsule (20 mg total) by mouth daily. 03/05/15  Yes Janith Lima, MD  furosemide (LASIX) 40 MG tablet Take 1 tablet (40 mg total) by mouth 2 (two) times daily. 06/12/15  Yes Larey Dresser, MD  glucosamine-chondroitin 500-400 MG tablet Take 1 tablet by mouth 2 (two) times daily.    Yes Historical Provider, MD  Hypromellose (GENTEAL MILD) 0.2 % SOLN Place 1 drop into both eyes  daily as needed (for dry eyes).    Yes Historical Provider, MD  losartan (COZAAR) 100 MG tablet Take 0.5 tablets (50 mg total) by mouth daily. 07/01/15  Yes Larey Dresser, MD  Misc Natural Products (NF FORMULAS TESTOSTERONE) CAPS Take 1 capsule by mouth 2 (two) times daily.   Yes Historical Provider, MD  Multiple Vitamin (MULTIVITAMIN) tablet Take 1 tablet by mouth daily.   Yes Historical Provider, MD  NIFEdipine (PROCARDIA-XL/ADALAT-CC/NIFEDICAL-XL) 30 MG 24 hr tablet Take 30 mg by mouth daily.   Yes Historical Provider, MD  nitroGLYCERIN (NITROSTAT) 0.4 MG SL tablet Place 0.4 mg under the tongue every 5 (five) minutes as needed for chest pain.   Yes Historical Provider, MD  omega-3 acid  ethyl esters (LOVAZA) 1 G capsule Take 1 g by mouth 2 (two) times daily.   Yes Historical Provider, MD  tamsulosin (FLOMAX) 0.4 MG CAPS capsule Take 0.4 mg by mouth daily after supper.   Yes Historical Provider, MD  temazepam (RESTORIL) 30 MG capsule TAKE 1 CAPSULE AT BEDTIME AS NEEDED FOR SLEEP. 02/20/15  Yes Janith Lima, MD  levofloxacin (LEVAQUIN) 500 MG tablet Take 1 tablet (500 mg total) by mouth daily. Patient not taking: Reported on 07/06/2015 07/05/15   Robyn Haber, MD   Physical Exam: Filed Vitals:   07/06/15 1000 07/06/15 1030 07/06/15 1200 07/06/15 1308  BP: 123/59 130/58 150/69   Pulse: 99 96 96   Temp: 99.3 F (37.4 C)  99 F (37.2 C)   TempSrc: Oral     Resp:   20   Height:    5\' 8"  (1.727 m)  Weight:    81.5 kg (179 lb 10.8 oz)  SpO2: 96% 91% 99%     Wt Readings from Last 3 Encounters:  07/06/15 81.5 kg (179 lb 10.8 oz)  07/05/15 81.92 kg (180 lb 9.6 oz)  06/30/15 83.235 kg (183 lb 8 oz)    General:  Appears calm and comfortable Eyes: PERRL, normal lids, irises & conjunctiva ENT: grossly normal hearing, lips & tongue Neck: no LAD, masses or thyromegaly Cardiovascular: RRR, no m/r/g. No LE edema. Respiratory: CTA bilaterally, no w/r/r. Normal respiratory effort. Abdomen:  Soft, discomfort at lower quadrants more right than left, no rebound tenderness, negative Murphy sign, hurting Skin: no rash or induration seen on limited exam Musculoskeletal: grossly normal tone BUE/BLE Psychiatric: grossly normal mood and affect, speech fluent and appropriate Neurologic: grossly non-focal.          Labs on Admission:  Basic Metabolic Panel:  Recent Labs Lab 06/30/15 1630 07/06/15 0855  NA 140 133*  K 5.0 4.4  CL 104 100*  CO2 27 23  GLUCOSE 121* 177*  BUN 40* 56*  CREATININE 2.21* 2.75*  CALCIUM 9.4 8.9   Liver Function Tests:  Recent Labs Lab 06/30/15 1630 07/06/15 0855  AST 29 28  ALT 24 27  ALKPHOS 25* 31*  BILITOT 0.6 0.7  PROT 7.0 7.4  ALBUMIN 3.8 3.4*    Recent Labs Lab 07/06/15 0855  LIPASE 18*   No results for input(s): AMMONIA in the last 168 hours. CBC:  Recent Labs Lab 06/30/15 1630 07/05/15 0950 07/06/15 0855  WBC 7.4 13.9* 14.7*  NEUTROABS  --   --  12.9*  HGB 10.2* 10.1* 9.1*  HCT 31.2* 29.7* 27.5*  MCV 95.7 92.8 95.5  PLT 191 194 182   Cardiac Enzymes: No results for input(s): CKTOTAL, CKMB, CKMBINDEX, TROPONINI in the last 168 hours.  BNP (last 3 results)  Recent Labs  05/27/15 1606 06/17/15 1050  BNP 321.7* 245.4*    ProBNP (last 3 results) No results for input(s): PROBNP in the last 8760 hours.  CBG: No results for input(s): GLUCAP in the last 168 hours.  Radiological Exams on Admission: Dg Chest 2 View  07/05/2015   CLINICAL DATA:  Fever and chills for 2 days.  EXAM: CHEST  2 VIEW  COMPARISON:  04/04/2013 and prior chest radiographs  FINDINGS: The cardiomediastinal silhouette is unremarkable.  CABG changes again noted.  Mild bibasilar scarring and elevation of the right hemidiaphragm are unchanged.  There is no evidence of focal airspace disease, pulmonary edema, suspicious pulmonary nodule/mass, pleural effusion, or pneumothorax. No acute bony abnormalities are identified.  IMPRESSION: No active  cardiopulmonary disease.   Electronically Signed   By: Margarette Canada M.D.   On: 07/05/2015 11:53   Dg Lumbar Spine 2-3 Views  07/05/2015   CLINICAL DATA:  Two month history of back pain.  EXAM: LUMBAR SPINE - 2-3 VIEW  COMPARISON:  CT scan from 2008.  FINDINGS: Severe degenerative lumbar spondylosis with severe multilevel disc disease and facet disease. This appears progressive since the prior CT scan. There is also chronic L5 spondylolisthesis due to bilateral pars defects. No acute bony findings. Moderate aortoiliac calcifications are noted without definite aneurysm.  IMPRESSION: Severe degenerative lumbar spondylosis with severe multilevel disc disease and facet disease.  Bilateral pars defects at L5 with a grade 1 spondylolisthesis.  No acute bony findings.   Electronically Signed   By: Marijo Sanes M.D.   On: 07/05/2015 11:56   US Abdomen Limited Ruq  07/06/2015   CLINICAL DATA:  Right upper quadrant pain x3 weeks, intermittent fever x5 days  EXAM: US ABDOMEN LIMITED - RIGHT UPPER QUADRANT  COMPARISON:  CT abdomen pelvis dated 01/08/2007  FINDINGS: Gallbladder:  5 mm gallstone in the gallbladder neck. No gallbladder wall thickening or pericholecystic fluid. Negative sonographic Murphy's sign.  Common bile duct:  Diameter: 3 mm  Liver:  No focal lesion identified. Within normal limits in parenchymal echogenicity.  Additional comments:  Severe right hydronephrosis, chronic.  IMPRESSION: 5 mm gallstone, without associated sonographic findings suggest acute cholecystitis.  Severe right hydronephrosis, chronic.   Electronically Signed   By: Julian Hy M.D.   On: 07/06/2015 10:26    EKG: Independently reviewed. Sinus rhythm with no ST elevations or depressions  Assessment/Plan Active Problems:     UTI (lower urinary tract infection) -We'll place patient on Rocephin -Monitor white blood cell count -Continue supportive therapy as suspect patient's abdominal discomfort reported as lower quadrant  discomfort is most likely secondary to UTI. Ultrasound obtained negative for findings consistent with acute cholecystitis based on report    AKI (acute kidney injury) -Most likely in the setting of dehydration and use of Lasix and ARB. We'll hold nephrotoxic agents - Gently hydrate given history of CHF - Reassess serum creatinine next a.m.  Hyperlipidemia with target LDL less than 100 - continue home regimen, stable    Essential hypertension -We'll hold ARB but otherwise continue home medication regimen.    DM (diabetes mellitus), type 2 with renal complications - Place on diabetic diet and sliding scale insulin    Chronic diastolic CHF (congestive heart failure) -As mentioned above will hold ARB and Lasix and will gently hydrate. Otherwise continue home medication regimen    Paroxysmal atrial fibrillation - Continue anticoagulation with eliquis although will have to be careful given his elevated serum creatinine and current creatinine clearance. -Patient is currently rate controlled with amiodarone   Code Status: Full DVT Prophylaxis: Patient is on Eliquis  Family Communication: Discussed directly with patient Disposition Plan: Med surg  Time spent: > 70 minutes  Velvet Bathe [facility] Pager [phone]

## 2015-07-06 NOTE — ED Notes (Addendum)
Pt reported RUQ pain with palpation and pt reported UTI per Urgent Care from yesterday.

## 2015-07-06 NOTE — ED Notes (Signed)
Awake. Verbally responsive. A/O x4. Resp even and unlabored. No audible adventitious breath sounds noted. ABC's intact. IV's patent and intact infusing NS.

## 2015-07-06 NOTE — ED Notes (Signed)
Awake. Verbally responsive. A/O x4. Resp even and unlabored. No audible adventitious breath sounds noted. ABC's intact. IV x2 patent and intact infusing NS without difficulty.

## 2015-07-06 NOTE — ED Notes (Signed)
US at bedside

## 2015-07-06 NOTE — ED Notes (Addendum)
Patient is aware we need urine. Will reassess him when fluid has been given

## 2015-07-06 NOTE — ED Notes (Signed)
Pt presents with c/o abdominal pain and centralized back pain that has been presents for approx 3 weeks. Pt reports he was diagnosed with a UTI yesterday at [facility] but the antibiotics they prescribed him were not compatible with his CHF medication according to his pharmacy. Pt also c/o constipation at this time as well.

## 2015-07-07 LAB — GLUCOSE, CAPILLARY
GLUCOSE-CAPILLARY: 129 mg/dL — AB (ref 65–99)
GLUCOSE-CAPILLARY: 156 mg/dL — AB (ref 65–99)
Glucose-Capillary: 138 mg/dL — ABNORMAL HIGH (ref 65–99)
Glucose-Capillary: 122 mg/dL — ABNORMAL HIGH (ref 65–99)
Glucose-Capillary: 184 mg/dL — ABNORMAL HIGH (ref 65–99)
Glucose-Capillary: 153 mg/dL — ABNORMAL HIGH (ref 65–99)
Glucose-Capillary: 141 mg/dL — ABNORMAL HIGH (ref 65–99)
Glucose-Capillary: 128 mg/dL — ABNORMAL HIGH (ref 65–99)

## 2015-07-07 LAB — CBC
HCT: 25.7 % — ABNORMAL LOW (ref 39.0–52.0)
Hemoglobin: 8.2 g/dL — ABNORMAL LOW (ref 13.0–17.0)
MCH: 30 pg (ref 26.0–34.0)
MCHC: 31.9 g/dL (ref 30.0–36.0)
MCV: 94.1 fL (ref 78.0–100.0)
Platelets: 153 10*3/uL (ref 150–400)
RBC: 2.73 MIL/uL — ABNORMAL LOW (ref 4.22–5.81)
RDW: 14.3 % (ref 11.5–15.5)
WBC: 13.8 10*3/uL — AB (ref 4.0–10.5)

## 2015-07-07 LAB — URINE CULTURE: Colony Count: >=100000

## 2015-07-07 LAB — COMPREHENSIVE METABOLIC PANEL
ALBUMIN: 3 g/dL — AB (ref 3.5–5.0)
ALT: 33 U/L (ref 17–63)
AST: 40 U/L (ref 15–41)
Alkaline Phosphatase: 28 U/L — ABNORMAL LOW (ref 38–126)
Anion gap: 10 (ref 5–15)
BUN: 51 mg/dL — ABNORMAL HIGH (ref 6–20)
CALCIUM: 8.2 mg/dL — AB (ref 8.9–10.3)
CHLORIDE: 104 mmol/L (ref 101–111)
CO2: 22 mmol/L (ref 22–32)
Creatinine, Ser: 2.02 mg/dL — ABNORMAL HIGH (ref 0.61–1.24)
GFR calc Af Amer: 33 mL/min — ABNORMAL LOW (ref >60)
GFR calc non Af Amer: 29 mL/min — ABNORMAL LOW (ref >60)
Glucose, Bld: 143 mg/dL — ABNORMAL HIGH (ref 65–99)
Potassium: 4.3 mmol/L (ref 3.5–5.1)
Sodium: 136 mmol/L (ref 135–145)
TOTAL PROTEIN: 7 g/dL (ref 6.5–8.1)
Total Bilirubin: 0.6 mg/dL (ref 0.3–1.2)

## 2015-07-07 MED ORDER — ALBUTEROL SULFATE (2.5 MG/3ML) 0.083% IN NEBU
2.5000 mg | INHALATION_SOLUTION | Freq: Four times a day (QID) | RESPIRATORY_TRACT | Status: DC | PRN
  Administered 2015-07-07 – 2015-07-09 (×4): 2.5 mg via RESPIRATORY_TRACT
  Filled 2015-07-07 (×4): qty 3

## 2015-07-07 MED ORDER — CETYLPYRIDINIUM CHLORIDE 0.05 % MT LIQD
7.0000 mL | Freq: Two times a day (BID) | OROMUCOSAL | Status: DC
  Administered 2015-07-07 – 2015-07-12 (×11): 7 mL via OROMUCOSAL

## 2015-07-07 NOTE — Progress Notes (Signed)
[facility] PROGRESS NOTE  Andrew [name] [id number] DOB: [birth year] DOA: 07/06/2015 PCP: [name], MD   Brief narrative: Patient is an 81 year old with history of paroxysmal atrial fibrillation, chronic diastolic CHF, DM type II, essential hypertension, hyperlipidemia. Presenting to the hospital complaining of lower abdominal discomfort. Diagnosed with UTI and AK on a secondary to dehydration and concurrent use of Lasix and ARB.  Assessment/Plan: Active Problems:  UTI (lower urinary tract infection) -Continue patient on Rocephin -Urine culture growing out Escherichia coli - Most likely source of abdominal discomfort given negative right upper quadrant ultrasound   AKI (acute kidney injury) -Most likely in the setting of dehydration and use of Lasix and ARB. We'll hold nephrotoxic agents - Continue gentle fluid hydration. - Reassess serum creatinine next a.m.  Hyperlipidemia with target LDL less than 100 - continue home regimen, stable   Essential hypertension -We'll hold ARB but otherwise continue home medication regimen.   DM (diabetes mellitus), type 2 with renal complications - Place on diabetic diet and sliding scale insulin   Chronic diastolic CHF (congestive heart failure) -As mentioned above will hold ARB and Lasix and will gently hydrate. Otherwise continue home medication regimen   Paroxysmal atrial fibrillation - Continue anticoagulation with eliquis although will have to be careful given his elevated serum creatinine and current creatinine clearance. -Patient is currently rate controlled with amiodarone   Code Status: Full Family Communication: Discussed with spouse and patient at bedside Disposition Plan: Pending improvement in condition. Would recommend transitioning to oral antibiotics once patient is fever free for more in 24 hours   Consultants:  None  Procedures:  None  Antibiotics:  Rocephin  HPI/Subjective: Patient has  no new complaints. No acute issues overnight.  Objective: Filed Vitals:   07/07/15 1518  BP: 134/52  Pulse: 84  Temp: 99.1 F (37.3 C)  Resp: 20    Intake/Output Summary (Last 24 hours) at 07/07/15 1710 Last data filed at 07/07/15 1518  Gross per 24 hour  Intake    720 ml  Output    800 ml  Net    -80 ml   Filed Weights   07/06/15 0908 07/06/15 1308  Weight: 80.74 kg (178 lb) 81.5 kg (179 lb 10.8 oz)    Exam:   General:  Patient in no acute distress, alert and awake  Cardiovascular: Regular rate and rhythm, no murmurs or rubs  Respiratory: Clear to auscultation bilaterally, no wheezes  Abdomen: Right lower quadrant discomfort, no guarding, soft  Musculoskeletal: No cyanosis or clubbing   Data Reviewed: Basic Metabolic Panel:  Recent Labs Lab 07/06/15 0855 07/07/15 0408  NA 133* 136  K 4.4 4.3  CL 100* 104  CO2 23 22  GLUCOSE 177* 143*  BUN 56* 51*  CREATININE 2.75* 2.02*  CALCIUM 8.9 8.2*  MG 2.2  --   PHOS 2.2*  --    Liver Function Tests:  Recent Labs Lab 07/06/15 0855 07/07/15 0408  AST 28 40  ALT 27 33  ALKPHOS 31* 28*  BILITOT 0.7 0.6  PROT 7.4 7.0  ALBUMIN 3.4* 3.0*    Recent Labs Lab 07/06/15 0855  LIPASE 18*   No results for input(s): AMMONIA in the last 168 hours. CBC:  Recent Labs Lab 07/05/15 0950 07/06/15 0855 07/07/15 0408  WBC 13.9* 14.7* 13.8*  NEUTROABS  --  12.9*  --   HGB 10.1* 9.1* 8.2*  HCT 29.7* 27.5* 25.7*  MCV 92.8 95.5 94.1  PLT 194 182 153   Cardiac  Enzymes: No results for input(s): CKTOTAL, CKMB, CKMBINDEX, TROPONINI in the last 168 hours. BNP (last 3 results)  Recent Labs  05/27/15 1606 06/17/15 1050  BNP 321.7* 245.4*    ProBNP (last 3 results) No results for input(s): PROBNP in the last 8760 hours.  CBG:  Recent Labs Lab 08/04/2015 1852 Aug 04, 2015 2350 07/07/15 0811 07/07/15 1233 07/07/15 1418  GLUCAP 129* 156* 122* 184* 153*    Recent Results (from the past 240 hour(s))  Urine  culture     Status: None   Collection Time: 07/05/15 10:52 AM  Result Value Ref Range Status   Culture ESCHERICHIA COLI  Final   Colony Count >=100,000 COLONIES/ML  Final   Organism ID, Bacteria ESCHERICHIA COLI  Final      Susceptibility   Escherichia coli -  (no method available)    AMPICILLIN <=2 Sensitive     AMOX/CLAVULANIC <=2 Sensitive     AMPICILLIN/SULBACTAM <=2 Sensitive     PIP/TAZO <=4 Sensitive     IMIPENEM <=0.25 Sensitive     CEFAZOLIN <=4 Sensitive     CEFTRIAXONE <=1 Sensitive     CEFTAZIDIME <=1 Sensitive     CEFEPIME <=1 Sensitive     GENTAMICIN <=1 Sensitive     TOBRAMYCIN <=1 Sensitive     CIPROFLOXACIN <=0.25 Sensitive     LEVOFLOXACIN <=0.12 Sensitive     NITROFURANTOIN <=16 Sensitive     TRIMETH/SULFA <=20 Sensitive   Blood culture (routine x 2)     Status: None (Preliminary result)   Collection Time: 08/04/2015  8:30 AM  Result Value Ref Range Status   Specimen Description BLOOD RIGHT ANTECUBITAL  Final   Special Requests BOTTLES DRAWN AEROBIC AND ANAEROBIC 5ML  Final   Culture   Final    NO GROWTH 1 DAY Performed at [facility]    Report Status PENDING  Incomplete  Blood culture (routine x 2)     Status: None (Preliminary result)   Collection Time: Aug 04, 2015  8:35 AM  Result Value Ref Range Status   Specimen Description BLOOD RIGHT FOREARM  Final   Special Requests BOTTLES DRAWN AEROBIC AND ANAEROBIC 5ML  Final   Culture   Final    NO GROWTH 1 DAY Performed at [facility]    Report Status PENDING  Incomplete  Urine culture     Status: None (Preliminary result)   Collection Time: August 04, 2015 10:57 AM  Result Value Ref Range Status   Specimen Description URINE, CLEAN CATCH  Final   Special Requests NONE  Final   Culture   Final    >=100,000 COLONIES/mL ESCHERICHIA COLI Performed at [facility]    Report Status PENDING  Incomplete     Studies: US Abdomen Limited Ruq  08/04/15   CLINICAL DATA:  Right upper quadrant  pain x3 weeks, intermittent fever x5 days  EXAM: US ABDOMEN LIMITED - RIGHT UPPER QUADRANT  COMPARISON:  CT abdomen pelvis dated 01/08/2007  FINDINGS: Gallbladder:  5 mm gallstone in the gallbladder neck. No gallbladder wall thickening or pericholecystic fluid. Negative sonographic Murphy's sign.  Common bile duct:  Diameter: 3 mm  Liver:  No focal lesion identified. Within normal limits in parenchymal echogenicity.  Additional comments:  Severe right hydronephrosis, chronic.  IMPRESSION: 5 mm gallstone, without associated sonographic findings suggest acute cholecystitis.  Severe right hydronephrosis, chronic.   Electronically Signed   By: Julian Hy M.D.   On: 08/04/2015 10:26    Scheduled Meds: . amiodarone  200  mg Oral Daily  . antiseptic oral rinse  7 mL Mouth Rinse BID  . apixaban  2.5 mg Oral BID  . aspirin EC  81 mg Oral Daily  . atorvastatin  80 mg Oral q1800  . cefTRIAXone (ROCEPHIN)  IV  1 g Intravenous Q24H  . cilostazol  100 mg Oral BID  . ezetimibe  10 mg Oral Daily  . fenofibrate  160 mg Oral Daily  . FLUoxetine  20 mg Oral Daily  . insulin aspart  0-15 Units Subcutaneous TID WC  . NIFEdipine  30 mg Oral Daily  . omega-3 acid ethyl esters  1 g Oral BID  . tamsulosin  0.4 mg Oral QPC supper   Continuous Infusions: . sodium chloride 75 mL/hr at 07/06/15 1719     Time spent: > 35 minutes    Velvet Bathe  [facility] Pager [phone]. If 7PM-7AM, please contact night-coverage at [email or web address], password [facility] 07/07/2015, 5:10 PM  LOS: 1 day

## 2015-07-07 NOTE — Evaluation (Signed)
Physical Therapy Evaluation Patient Details Name: Andrew [name] MRN: [id number] DOB: April 22, 1928 Today's Date: 07/07/2015   History of Present Illness  81 yo male admitted with UTI, weakness. Hx of Afib, CHF, AAA, HTN, CAD, chronic renal insufficiency, DM  Clinical Impression  On eval, pt required Min assist for mobility-able to ambulate ~125 feet with RW. Dyspnea 2/4 and pt fatigued fairly easily with activity. Pt had bowel incontinence during the session-total assist for hygiene. Pt will likely require 24 hour supervision initially.     Follow Up Recommendations Home health PT;Supervision/Assistance - 24 hour    Equipment Recommendations  Rolling walker with 5" wheels    Recommendations for Other Services       Precautions / Restrictions Precautions Precautions: Fall Restrictions Weight Bearing Restrictions: No      Mobility  Bed Mobility Overal bed mobility: Needs Assistance Bed Mobility: Supine to Sit;Sit to Supine     Supine to sit: Min guard Sit to supine: Min guard   General bed mobility comments: close guard for safety. Increased time.   Transfers Overall transfer level: Needs assistance Equipment used: Rolling walker (2 wheeled) Transfers: Sit to/from Stand Sit to Stand: Min assist         General transfer comment: Assist to rise, stabilize, control descent. Sit to stand x2.   Ambulation/Gait Ambulation/Gait assistance: Min assist Ambulation Distance (Feet): 115 Feet Assistive device: Rolling walker (2 wheeled)       General Gait Details: Assist to stabilize intermittently. Pt tends to move a quick pace, at times out of control.   Stairs            Wheelchair Mobility    Modified Rankin (Stroke Patients Only)       Balance Overall balance assessment: Needs assistance         Standing balance support: Bilateral upper extremity supported;During functional activity Standing balance-Leahy Scale: Poor                                Pertinent Vitals/Pain Pain Assessment: 0-10 Pain Score: 5  Pain Location: lower abdomen Pain Descriptors / Indicators: Sore Pain Intervention(s): Monitored during session    Home Living Family/patient expects to be discharged to:: Private residence Living Arrangements: Spouse/significant other Available Help at Discharge: Family Type of Home: House Home Access: Stairs to enter Entrance Stairs-Rails: Right Entrance Stairs-Number of Steps: 3 Home Layout: Two level;Able to live on main level with bedroom/bathroom Home Equipment: None      Prior Function Level of Independence: Independent with assistive device(s)               Hand Dominance        Extremity/Trunk Assessment   Upper Extremity Assessment: Generalized weakness           Lower Extremity Assessment: Generalized weakness      Cervical / Trunk Assessment: Kyphotic  Communication   Communication: HOH  Cognition Arousal/Alertness: Awake/alert Behavior During Therapy: WFL for tasks assessed/performed Overall Cognitive Status: Within Functional Limits for tasks assessed                      General Comments      Exercises        Assessment/Plan    PT Assessment Patient needs continued PT services  PT Diagnosis Difficulty walking;Generalized weakness;Acute pain   PT Problem List Decreased strength;Decreased activity tolerance;Decreased balance;Decreased mobility;Decreased knowledge of use of DME;Pain  PT Treatment Interventions DME instruction;Gait training;Functional mobility training;Therapeutic activities;Therapeutic exercise;Patient/family education;Balance training   PT Goals (Current goals can be found in the Care Plan section) Acute Rehab PT Goals Patient Stated Goal: to get stronger PT Goal Formulation: With patient Time For Goal Achievement: 07/21/15 Potential to Achieve Goals: Good    Frequency Min 3X/week   Barriers to discharge        Co-evaluation                End of Session Equipment Utilized During Treatment: Gait belt Activity Tolerance: Patient limited by fatigue Patient left: in bed;with call bell/phone within reach;with bed alarm set           Time: 1355-1426 PT Time Calculation (min) (ACUTE ONLY): 31 min   Charges:   PT Evaluation $Initial PT Evaluation Tier I: 1 Procedure PT Treatments $Gait Training: 8-22 mins   PT G Codes:        Weston Anna, MPT Pager: [phone]

## 2015-07-07 NOTE — Progress Notes (Signed)
Patient verbalized that he felt better. Comfortable  and on a upward position at this time. Denies SOB. Endorsed to night nurse.

## 2015-07-07 NOTE — Progress Notes (Signed)
At approximately 1810,patient c/o SOB,has audible wheezing. Patient alert and orientedx3. Vital signs obtained,O2 sat 92% at 2L,BP- 139/71,PR-63,RR-21.MD notified,ordered Albuterol neb and  D/c'd IVF. RT notified and Albuterol administered. Will continue to monitor.

## 2015-07-08 ENCOUNTER — Other Ambulatory Visit (HOSPITAL_COMMUNITY): Payer: Medicare PPO

## 2015-07-08 ENCOUNTER — Inpatient Hospital Stay (HOSPITAL_COMMUNITY): Payer: Medicare PPO

## 2015-07-08 DIAGNOSIS — E1122 Type 2 diabetes mellitus with diabetic chronic kidney disease: Secondary | ICD-10-CM

## 2015-07-08 DIAGNOSIS — I5032 Chronic diastolic (congestive) heart failure: Secondary | ICD-10-CM

## 2015-07-08 DIAGNOSIS — N189 Chronic kidney disease, unspecified: Secondary | ICD-10-CM

## 2015-07-08 DIAGNOSIS — N179 Acute kidney failure, unspecified: Secondary | ICD-10-CM

## 2015-07-08 LAB — URINE CULTURE: Culture: >=100000

## 2015-07-08 LAB — CBC
HCT: 25.6 % — ABNORMAL LOW (ref 39.0–52.0)
Hemoglobin: 8.6 g/dL — ABNORMAL LOW (ref 13.0–17.0)
MCH: 31.9 pg (ref 26.0–34.0)
MCHC: 33.6 g/dL (ref 30.0–36.0)
MCV: 94.8 fL (ref 78.0–100.0)
Platelets: 201 10*3/uL (ref 150–400)
RBC: 2.7 MIL/uL — ABNORMAL LOW (ref 4.22–5.81)
RDW: 14.5 % (ref 11.5–15.5)
WBC: 13.7 10*3/uL — AB (ref 4.0–10.5)

## 2015-07-08 LAB — BASIC METABOLIC PANEL
ANION GAP: 9 (ref 5–15)
BUN: 65 mg/dL — ABNORMAL HIGH (ref 6–20)
CALCIUM: 9 mg/dL (ref 8.9–10.3)
CO2: 24 mmol/L (ref 22–32)
Chloride: 105 mmol/L (ref 101–111)
Creatinine, Ser: 2.18 mg/dL — ABNORMAL HIGH (ref 0.61–1.24)
GFR calc Af Amer: 30 mL/min — ABNORMAL LOW (ref >60)
GFR calc non Af Amer: 26 mL/min — ABNORMAL LOW (ref >60)
Glucose, Bld: 165 mg/dL — ABNORMAL HIGH (ref 65–99)
Potassium: 4.1 mmol/L (ref 3.5–5.1)
Sodium: 138 mmol/L (ref 135–145)

## 2015-07-08 LAB — GLUCOSE, CAPILLARY
GLUCOSE-CAPILLARY: 195 mg/dL — AB (ref 65–99)
GLUCOSE-CAPILLARY: 150 mg/dL — AB (ref 65–99)
Glucose-Capillary: 149 mg/dL — ABNORMAL HIGH (ref 65–99)
Glucose-Capillary: 170 mg/dL — ABNORMAL HIGH (ref 65–99)

## 2015-07-08 MED ORDER — FUROSEMIDE 10 MG/ML IJ SOLN
40.0000 mg | Freq: Two times a day (BID) | INTRAMUSCULAR | Status: DC
  Administered 2015-07-08 – 2015-07-09 (×2): 40 mg via INTRAVENOUS
  Filled 2015-07-08 (×4): qty 4

## 2015-07-08 MED ORDER — POTASSIUM CHLORIDE CRYS ER 20 MEQ PO TBCR
20.0000 meq | EXTENDED_RELEASE_TABLET | Freq: Every day | ORAL | Status: DC
  Administered 2015-07-08 – 2015-07-11 (×4): 20 meq via ORAL
  Filled 2015-07-08 (×4): qty 1

## 2015-07-08 MED ORDER — IPRATROPIUM-ALBUTEROL 0.5-2.5 (3) MG/3ML IN SOLN
3.0000 mL | Freq: Three times a day (TID) | RESPIRATORY_TRACT | Status: DC
  Administered 2015-07-08: 3 mL via RESPIRATORY_TRACT
  Filled 2015-07-08: qty 3

## 2015-07-08 NOTE — Care Management Note (Signed)
Case Management Note  Patient Details  Name: Andrew [name] MRN: [id number] Date of Birth: 06-Jul-1928  Subjective/Objective:               81 yo admitted with UTI     Action/Plan: From home with wife  Expected Discharge Date:   (UNKNOWN)               Expected Discharge Plan:  Home/Self Care  In-House Referral:     Discharge planning Services  CM Consult  Post Acute Care Choice:    Choice offered to:     DME Arranged:    DME Agency:     HH Arranged:    HH Agency:     Status of Service:  In process, will continue to follow  Medicare Important Message Given:  Yes-second notification given Date Medicare IM Given:    Medicare IM give by:    Date Additional Medicare IM Given:    Additional Medicare Important Message give by:     If discussed at [place] of Stay Meetings, dates discussed:    Additional Comments: Met with pt at bedside to offer choice for HHPT. Pt politely declines [facility] services at this time. Pt states his wife helps him and he uses his walker at home. [facility] provider list left with pt and pt encouraged to let his RN know if he changes his mind about [facility] services. Pt appreciative of CM assistance. Lynnell Catalan, RN 07/08/2015, 2:54 PM

## 2015-07-08 NOTE — Care Management Important Message (Signed)
Important Message  Patient Details  Name: Andrew [name] MRN: [id number] Date of Birth: [birth year]   Medicare Important Message Given:  Yes-second notification given    Camillo Flaming 07/08/2015, 10:44 AMImportant Message  Patient Details  Name: Andrew [name] MRN: [id number] Date of Birth: March 02, 1928   Medicare Important Message Given:  Yes-second notification given    Camillo Flaming 07/08/2015, 10:44 AM

## 2015-07-08 NOTE — Clinical Documentation Improvement (Signed)
Possible Clinical Conditions?  Septicemia / Sepsis Severe Sepsis Septic Shock Sepsis with UTI Sepsis due to an internal device  Other Condition  Cannot clinically Determine   Supporting Information :(As per notes) pt has UTI and AKI  Vitals upon admission: BP 123/59 mmHg  Pulse 99  Temp(Src) 101 F (38.3 C) (Oral)  Resp 26  Ht 5\' 8"  (1.727 m)  Wt 178 lb (80.74 kg)  BMI 27.07 kg/m2  SpO2 96%  WBC= 14.7 (*)   Thank You, Alessandra Grout, RN, BSN, CCDS,Clinical Documentation Specialist:  [phone]  [phone]=[facility] [place]- Health Information Management

## 2015-07-08 NOTE — Progress Notes (Signed)
[facility] PROGRESS NOTE  Andrew [name] [id number] DOB: Oct 18, 1928 DOA: 07/06/2015 PCP: [name], MD  Brief Summary  Patient is an 81 year old with history of paroxysmal atrial fibrillation, chronic diastolic CHF, DM type II, essential hypertension, hyperlipidemia who presented with lower abdominal discomfort. Diagnosed with UTI and AKI on a secondary to dehydration and concurrent use of Lasix and ARB.  Assessment/Plan  UTI (lower urinary tract infection) -Continue Rocephin -E. Coli pansensitive - Most likely source of abdominal discomfort given negative right upper quadrant ultrasound  AKI (acute kidney injury), creatinine now stable near 2-2.2 - minimize nephrotoxins and renally dose medications - will need f/u with PCP and nephrology -  No acidosis or hyperkalemia  Acute on chronic diastolic CHF (congestive heart failure)  -  Resume lasix, but may need higher doses for response given GFR -  Daily weights -  Strict I/O -  Trend creatinine carefully  Hyperlipidemia with target LDL less than 100 - continue home regimen, stable   Essential hypertension - continue to hold ARB due to recent AKI  DM (diabetes mellitus), type 2 with renal complications, CBG well controlled - Place on diabetic diet and sliding scale insulin   Paroxysmal atrial fibrillation - Continue anticoagulation with eliquis although will have to be careful given his elevated serum creatinine and current creatinine clearance.  - Rate controlled with amiodarone  Anemia of chronic renal disease, hemoglobin stable at 8.6mg /dl  Mild leukocytosis, trending down with antibiotics -  Repeat CBC in AM  Diet:  diabetic Access:  PIV IVF:  off Proph:  apixaban  Code Status: full Family Communication: patient alone Disposition Plan: pending diuresis to help with breathing.  Will need ambulator pulse ox prior to discharge.  [facility] with 24h supervision and rolling walker with 5"  wheels   Consultants:  None  Procedures:  None  Antibiotics:  Rocephin 7/11 >   HPI/Subjective:  SOB at rest since last night.  Denies chest pains, nausea, vomiting, diarrhea.  Nebulizer treatments not helping much.    Objective: Filed Vitals:   07/08/15 0839 07/08/15 1008 07/08/15 1330 07/08/15 1349  BP:  138/62  142/72  Pulse:  82  82  Temp:    98.8 F (37.1 C)  TempSrc:    Oral  Resp:    20  Height:      Weight:      SpO2: 93%  92% 94%    Intake/Output Summary (Last 24 hours) at 07/08/15 1802 Last data filed at 07/08/15 1723  Gross per 24 hour  Intake    600 ml  Output    750 ml  Net   -150 ml   Filed Weights   07/06/15 0908 07/06/15 1308  Weight: 80.74 kg (178 lb) 81.5 kg (179 lb 10.8 oz)   Body mass index is 27.33 kg/(m^2).  Exam:   General:  Overweight male, mild respiratory distress, breathless when talking and needs breaks during speech to catch breath  HEENT:  NCAT, MMM  Cardiovascular:  IRRR, nl S1, S2 no mrg, 2+ pulses, warm extremities  Respiratory:  Rales at the bases and wheeze throughout, no rhonchi  Abdomen:   NABS, soft, mildly distended, nontender  MSK:   Normal tone and bulk, no LEE  Neuro:  Grossly intact  Data Reviewed: Basic Metabolic Panel:  Recent Labs Lab 07/06/15 0855 07/07/15 0408 07/08/15 0417  NA 133* 136 138  K 4.4 4.3 4.1  CL 100* 104 105  CO2 23 22 24   GLUCOSE 177*  143* 165*  BUN 56* 51* 65*  CREATININE 2.75* 2.02* 2.18*  CALCIUM 8.9 8.2* 9.0  MG 2.2  --   --   PHOS 2.2*  --   --    Liver Function Tests:  Recent Labs Lab 07/06/15 0855 07/07/15 0408  AST 28 40  ALT 27 33  ALKPHOS 31* 28*  BILITOT 0.7 0.6  PROT 7.4 7.0  ALBUMIN 3.4* 3.0*    Recent Labs Lab 07/06/15 0855  LIPASE 18*   No results for input(s): AMMONIA in the last 168 hours. CBC:  Recent Labs Lab 07/05/15 0950 07/06/15 0855 07/07/15 0408 07/08/15 0417  WBC 13.9* 14.7* 13.8* 13.7*  NEUTROABS  --  12.9*  --   --    HGB 10.1* 9.1* 8.2* 8.6*  HCT 29.7* 27.5* 25.7* 25.6*  MCV 92.8 95.5 94.1 94.8  PLT 194 182 153 201    Recent Results (from the past 240 hour(s))  Urine culture     Status: None   Collection Time: 07/05/15 10:52 AM  Result Value Ref Range Status   Culture ESCHERICHIA COLI  Final   Colony Count >=100,000 COLONIES/ML  Final   Organism ID, Bacteria ESCHERICHIA COLI  Final      Susceptibility   Escherichia coli -  (no method available)    AMPICILLIN <=2 Sensitive     AMOX/CLAVULANIC <=2 Sensitive     AMPICILLIN/SULBACTAM <=2 Sensitive     PIP/TAZO <=4 Sensitive     IMIPENEM <=0.25 Sensitive     CEFAZOLIN <=4 Sensitive     CEFTRIAXONE <=1 Sensitive     CEFTAZIDIME <=1 Sensitive     CEFEPIME <=1 Sensitive     GENTAMICIN <=1 Sensitive     TOBRAMYCIN <=1 Sensitive     CIPROFLOXACIN <=0.25 Sensitive     LEVOFLOXACIN <=0.12 Sensitive     NITROFURANTOIN <=16 Sensitive     TRIMETH/SULFA <=20 Sensitive   Blood culture (routine x 2)     Status: None (Preliminary result)   Collection Time: 07/06/15  8:30 AM  Result Value Ref Range Status   Specimen Description BLOOD RIGHT ANTECUBITAL  Final   Special Requests BOTTLES DRAWN AEROBIC AND ANAEROBIC 5ML  Final   Culture   Final    NO GROWTH 2 DAYS Performed at [facility]    Report Status PENDING  Incomplete  Blood culture (routine x 2)     Status: None (Preliminary result)   Collection Time: 07/06/15  8:35 AM  Result Value Ref Range Status   Specimen Description BLOOD RIGHT FOREARM  Final   Special Requests BOTTLES DRAWN AEROBIC AND ANAEROBIC 5ML  Final   Culture   Final    NO GROWTH 2 DAYS Performed at [facility]    Report Status PENDING  Incomplete  Urine culture     Status: None   Collection Time: 07/06/15 10:57 AM  Result Value Ref Range Status   Specimen Description URINE, CLEAN CATCH  Final   Special Requests NONE  Final   Culture   Final    >=100,000 COLONIES/mL ESCHERICHIA COLI Performed at [facility]    Report Status 07/08/2015 FINAL  Final   Organism ID, Bacteria ESCHERICHIA COLI  Final      Susceptibility   Escherichia coli - MIC*    AMPICILLIN <=2 SENSITIVE Sensitive     CEFAZOLIN <=4 SENSITIVE Sensitive     CEFTRIAXONE <=1 SENSITIVE Sensitive     CIPROFLOXACIN <=0.25 SENSITIVE Sensitive     GENTAMICIN <=  1 SENSITIVE Sensitive     IMIPENEM <=0.25 SENSITIVE Sensitive     NITROFURANTOIN <=16 SENSITIVE Sensitive     TRIMETH/SULFA <=20 SENSITIVE Sensitive     AMPICILLIN/SULBACTAM <=2 SENSITIVE Sensitive     PIP/TAZO <=4 SENSITIVE Sensitive     * >=100,000 COLONIES/mL ESCHERICHIA COLI     Studies: Dg Chest Port 1 View  07/08/2015   CLINICAL DATA:  Wheezing.  Nonproductive cough.  Fever.  EXAM: PORTABLE CHEST - 1 VIEW  COMPARISON:  07/05/2015  FINDINGS: Previous median sternotomy and CABG procedure. Aortic atherosclerosis noted. There is asymmetric elevation of right hemidiaphragm. Diffuse bronchial wall thickening is noted bilaterally. Atelectasis and volume loss is identified in the right lung base.  IMPRESSION: 1. Diffuse bronchitic changes. 2. Atelectasis and volume loss within the right lung base.   Electronically Signed   By: Kerby Moors M.D.   On: 07/08/2015 14:19    Scheduled Meds: . amiodarone  200 mg Oral Daily  . antiseptic oral rinse  7 mL Mouth Rinse BID  . apixaban  2.5 mg Oral BID  . aspirin EC  81 mg Oral Daily  . atorvastatin  80 mg Oral q1800  . cefTRIAXone (ROCEPHIN)  IV  1 g Intravenous Q24H  . cilostazol  100 mg Oral BID  . ezetimibe  10 mg Oral Daily  . fenofibrate  160 mg Oral Daily  . FLUoxetine  20 mg Oral Daily  . furosemide  40 mg Intravenous BID  . insulin aspart  0-15 Units Subcutaneous TID WC  . NIFEdipine  30 mg Oral Daily  . omega-3 acid ethyl esters  1 g Oral BID  . potassium chloride  20 mEq Oral Daily  . tamsulosin  0.4 mg Oral QPC supper   Continuous Infusions:   Active Problems:   Hyperlipidemia with target LDL less than  100   Essential hypertension   Chronic renal insufficiency, stage II (mild)   DM (diabetes mellitus), type 2 with renal complications   Chronic diastolic CHF (congestive heart failure)   Paroxysmal atrial fibrillation   UTI (lower urinary tract infection)   AKI (acute kidney injury)    Time spent: 30 min    Tracie Lindbloom, [place] [facility] Pager [phone]. If 7PM-7AM, please contact night-coverage at [email or web address], password [facility] 07/08/2015, 6:02 PM  LOS: 2 days

## 2015-07-09 ENCOUNTER — Ambulatory Visit: Payer: Medicare PPO | Admitting: Family Medicine

## 2015-07-09 DIAGNOSIS — I480 Paroxysmal atrial fibrillation: Secondary | ICD-10-CM

## 2015-07-09 DIAGNOSIS — I5033 Acute on chronic diastolic (congestive) heart failure: Secondary | ICD-10-CM

## 2015-07-09 LAB — CBC
HCT: 27.1 % — ABNORMAL LOW (ref 39.0–52.0)
Hemoglobin: 8.8 g/dL — ABNORMAL LOW (ref 13.0–17.0)
MCH: 30.6 pg (ref 26.0–34.0)
MCHC: 32.5 g/dL (ref 30.0–36.0)
MCV: 94.1 fL (ref 78.0–100.0)
PLATELETS: 238 10*3/uL (ref 150–400)
RBC: 2.88 MIL/uL — ABNORMAL LOW (ref 4.22–5.81)
RDW: 14.4 % (ref 11.5–15.5)
WBC: 13.4 10*3/uL — ABNORMAL HIGH (ref 4.0–10.5)

## 2015-07-09 LAB — GLUCOSE, CAPILLARY
GLUCOSE-CAPILLARY: 143 mg/dL — AB (ref 65–99)
GLUCOSE-CAPILLARY: 154 mg/dL — AB (ref 65–99)
Glucose-Capillary: 191 mg/dL — ABNORMAL HIGH (ref 65–99)
Glucose-Capillary: 154 mg/dL — ABNORMAL HIGH (ref 65–99)

## 2015-07-09 LAB — BASIC METABOLIC PANEL
ANION GAP: 10 (ref 5–15)
BUN: 66 mg/dL — ABNORMAL HIGH (ref 6–20)
CO2: 25 mmol/L (ref 22–32)
CREATININE: 2.01 mg/dL — AB (ref 0.61–1.24)
Calcium: 9.5 mg/dL (ref 8.9–10.3)
Chloride: 103 mmol/L (ref 101–111)
GFR calc non Af Amer: 29 mL/min — ABNORMAL LOW (ref >60)
GFR, EST AFRICAN AMERICAN: 33 mL/min — AB (ref >60)
Glucose, Bld: 160 mg/dL — ABNORMAL HIGH (ref 65–99)
Potassium: 4.4 mmol/L (ref 3.5–5.1)
SODIUM: 138 mmol/L (ref 135–145)

## 2015-07-09 MED ORDER — ALBUTEROL SULFATE (2.5 MG/3ML) 0.083% IN NEBU
2.5000 mg | INHALATION_SOLUTION | RESPIRATORY_TRACT | Status: DC | PRN
  Administered 2015-07-09 – 2015-07-10 (×2): 2.5 mg via RESPIRATORY_TRACT
  Filled 2015-07-09 (×2): qty 3

## 2015-07-09 MED ORDER — FUROSEMIDE 10 MG/ML IJ SOLN
60.0000 mg | Freq: Three times a day (TID) | INTRAMUSCULAR | Status: DC
  Administered 2015-07-09 – 2015-07-12 (×8): 60 mg via INTRAVENOUS
  Filled 2015-07-09 (×10): qty 6

## 2015-07-09 NOTE — Progress Notes (Signed)
[facility] PROGRESS NOTE  Andrew [name] [id number] DOB: [birth year] DOA: 07/06/2015 PCP: [name], MD  Brief Summary  Patient is an 81 year old with history of paroxysmal atrial fibrillation, chronic diastolic CHF, DM type II, essential hypertension, hyperlipidemia who presented with lower abdominal discomfort. Diagnosed with UTI and AKI on a secondary to dehydration and concurrent use of Lasix and ARB.  Assessment/Plan  UTI (lower urinary tract infection), present at time of admission -Continue Rocephin -E. Coli pansensitive - Most likely source of abdominal discomfort given negative right upper quadrant ultrasound  AKI (acute kidney injury), creatinine now stable near 2-2.2 - minimize nephrotoxins and renally dose medications - will need f/u with PCP and nephrology -  No acidosis or hyperkalemia  Acute on chronic diastolic CHF (congestive heart failure)  -  85 kg and only -280 mL -  Trend creatinine carefully  Hyperlipidemia with target LDL less than 100 - continue home regimen, stable  Essential hypertension - continue to hold ARB due to recent AKI  DM (diabetes mellitus), type 2 with renal complications, CBG well controlled - continue diabetic diet and sliding scale insulin  Paroxysmal atrial fibrillation - Continue anticoagulation with eliquis although will have to be careful given his elevated serum creatinine and current creatinine clearance.  - Rate controlled with amiodarone  Anemia of chronic renal disease, hemoglobin stable at 8.6mg /dl  Mild leukocytosis, trending down with antibiotics -  Repeat CBC in AM  Diet:  diabetic Access:  PIV IVF:  off Proph:  apixaban  Code Status: full Family Communication: patient alone Disposition Plan: pending further diuresis to help with breathing.  Will need ambulator pulse ox prior to discharge.  HH PT with 24h supervision and rolling walker with 5" wheels.  Will need home  O2   Consultants:  None  Procedures:  None  Antibiotics:  Rocephin 7/11 >   HPI/Subjective:  Still SOB at rest.  Has cough, wheeze, and chest tightness   Objective: Filed Vitals:   07/09/15 0748 07/09/15 1230 07/09/15 1417 07/09/15 1535  BP:   155/60   Pulse:   88   Temp:   98.7 F (37.1 C)   TempSrc:   Oral   Resp:   20   Height:      Weight: 42.3 kg (93 lb 4.1 oz) 85.276 kg (188 lb)    SpO2:   86% 92%    Intake/Output Summary (Last 24 hours) at 07/09/15 1735 Last data filed at 07/09/15 1400  Gross per 24 hour  Intake    290 ml  Output   1050 ml  Net   -760 ml   Filed Weights   07/06/15 1308 07/09/15 0748 07/09/15 1230  Weight: 81.5 kg (179 lb 10.8 oz) 42.3 kg (93 lb 4.1 oz) 85.276 kg (188 lb)   Body mass index is 28.59 kg/(m^2).  Exam:   General:  Overweight male, slightly less breathless this morning  HEENT:  NCAT, MMM  Cardiovascular:  IRRR, nl S1, S2 no mrg, 2+ pulses, warm extremities  Respiratory:  Rales at the bases and wheeze throughout, no rhonchi  Abdomen:   NABS, softer, less distended, nontender  MSK:   Normal tone and bulk, no LEE  Neuro:  Grossly intact  Data Reviewed: Basic Metabolic Panel:  Recent Labs Lab 07/06/15 0855 07/07/15 0408 07/08/15 0417 07/09/15 0405  NA 133* 136 138 138  K 4.4 4.3 4.1 4.4  CL 100* 104 105 103  CO2 23 22 24 25   GLUCOSE 177* 143* 165*  160*  BUN 56* 51* 65* 66*  CREATININE 2.75* 2.02* 2.18* 2.01*  CALCIUM 8.9 8.2* 9.0 9.5  MG 2.2  --   --   --   PHOS 2.2*  --   --   --    Liver Function Tests:  Recent Labs Lab 07/06/15 0855 07/07/15 0408  AST 28 40  ALT 27 33  ALKPHOS 31* 28*  BILITOT 0.7 0.6  PROT 7.4 7.0  ALBUMIN 3.4* 3.0*    Recent Labs Lab 07/06/15 0855  LIPASE 18*   No results for input(s): AMMONIA in the last 168 hours. CBC:  Recent Labs Lab 07/05/15 0950 07/06/15 0855 07/07/15 0408 07/08/15 0417 07/09/15 0405  WBC 13.9* 14.7* 13.8* 13.7* 13.4*  NEUTROABS   --  12.9*  --   --   --   HGB 10.1* 9.1* 8.2* 8.6* 8.8*  HCT 29.7* 27.5* 25.7* 25.6* 27.1*  MCV 92.8 95.5 94.1 94.8 94.1  PLT 194 182 153 201 238    Recent Results (from the past 240 hour(s))  Urine culture     Status: None   Collection Time: 07/05/15 10:52 AM  Result Value Ref Range Status   Culture ESCHERICHIA COLI  Final   Colony Count >=100,000 COLONIES/ML  Final   Organism ID, Bacteria ESCHERICHIA COLI  Final      Susceptibility   Escherichia coli -  (no method available)    AMPICILLIN <=2 Sensitive     AMOX/CLAVULANIC <=2 Sensitive     AMPICILLIN/SULBACTAM <=2 Sensitive     PIP/TAZO <=4 Sensitive     IMIPENEM <=0.25 Sensitive     CEFAZOLIN <=4 Sensitive     CEFTRIAXONE <=1 Sensitive     CEFTAZIDIME <=1 Sensitive     CEFEPIME <=1 Sensitive     GENTAMICIN <=1 Sensitive     TOBRAMYCIN <=1 Sensitive     CIPROFLOXACIN <=0.25 Sensitive     LEVOFLOXACIN <=0.12 Sensitive     NITROFURANTOIN <=16 Sensitive     TRIMETH/SULFA <=20 Sensitive   Blood culture (routine x 2)     Status: None (Preliminary result)   Collection Time: 07/06/15  8:30 AM  Result Value Ref Range Status   Specimen Description BLOOD RIGHT ANTECUBITAL  Final   Special Requests BOTTLES DRAWN AEROBIC AND ANAEROBIC 5ML  Final   Culture   Final    NO GROWTH 3 DAYS Performed at [facility]    Report Status PENDING  Incomplete  Blood culture (routine x 2)     Status: None (Preliminary result)   Collection Time: 07/06/15  8:35 AM  Result Value Ref Range Status   Specimen Description BLOOD RIGHT FOREARM  Final   Special Requests BOTTLES DRAWN AEROBIC AND ANAEROBIC 5ML  Final   Culture   Final    NO GROWTH 3 DAYS Performed at [facility]    Report Status PENDING  Incomplete  Urine culture     Status: None   Collection Time: 07/06/15 10:57 AM  Result Value Ref Range Status   Specimen Description URINE, CLEAN CATCH  Final   Special Requests NONE  Final   Culture   Final    >=100,000  COLONIES/mL ESCHERICHIA COLI Performed at [facility]    Report Status 07/08/2015 FINAL  Final   Organism ID, Bacteria ESCHERICHIA COLI  Final      Susceptibility   Escherichia coli - MIC*    AMPICILLIN <=2 SENSITIVE Sensitive     CEFAZOLIN <=4 SENSITIVE Sensitive  CEFTRIAXONE <=1 SENSITIVE Sensitive     CIPROFLOXACIN <=0.25 SENSITIVE Sensitive     GENTAMICIN <=1 SENSITIVE Sensitive     IMIPENEM <=0.25 SENSITIVE Sensitive     NITROFURANTOIN <=16 SENSITIVE Sensitive     TRIMETH/SULFA <=20 SENSITIVE Sensitive     AMPICILLIN/SULBACTAM <=2 SENSITIVE Sensitive     PIP/TAZO <=4 SENSITIVE Sensitive     * >=100,000 COLONIES/mL ESCHERICHIA COLI     Studies: Dg Chest Port 1 View  07/08/2015   CLINICAL DATA:  Wheezing.  Nonproductive cough.  Fever.  EXAM: PORTABLE CHEST - 1 VIEW  COMPARISON:  07/05/2015  FINDINGS: Previous median sternotomy and CABG procedure. Aortic atherosclerosis noted. There is asymmetric elevation of right hemidiaphragm. Diffuse bronchial wall thickening is noted bilaterally. Atelectasis and volume loss is identified in the right lung base.  IMPRESSION: 1. Diffuse bronchitic changes. 2. Atelectasis and volume loss within the right lung base.   Electronically Signed   By: Kerby Moors M.D.   On: 07/08/2015 14:19    Scheduled Meds: . amiodarone  200 mg Oral Daily  . antiseptic oral rinse  7 mL Mouth Rinse BID  . apixaban  2.5 mg Oral BID  . aspirin EC  81 mg Oral Daily  . atorvastatin  80 mg Oral q1800  . cefTRIAXone (ROCEPHIN)  IV  1 g Intravenous Q24H  . cilostazol  100 mg Oral BID  . ezetimibe  10 mg Oral Daily  . fenofibrate  160 mg Oral Daily  . FLUoxetine  20 mg Oral Daily  . furosemide  40 mg Intravenous BID  . insulin aspart  0-15 Units Subcutaneous TID WC  . NIFEdipine  30 mg Oral Daily  . omega-3 acid ethyl esters  1 g Oral BID  . potassium chloride  20 mEq Oral Daily  . tamsulosin  0.4 mg Oral QPC supper   Continuous Infusions:   Active  Problems:   Hyperlipidemia with target LDL less than 100   Essential hypertension   Chronic renal insufficiency, stage II (mild)   DM (diabetes mellitus), type 2 with renal complications   Chronic diastolic CHF (congestive heart failure)   Paroxysmal atrial fibrillation   UTI (lower urinary tract infection)   AKI (acute kidney injury)    Time spent: 30 min    Alonia Dibuono, [place] [facility] Pager [phone]. If 7PM-7AM, please contact night-coverage at [email or web address], password [email or web address] 07/09/2015, 5:35 PM  LOS: 3 days

## 2015-07-09 NOTE — Progress Notes (Signed)
Physical Therapy Treatment Patient Details Name: Andrew [name] MRN: [id number] DOB: [birth year] Today's Date: 07/09/2015    History of Present Illness 81 yo male admitted with UTI, weakness. Hx of Afib, CHF, AAA, HTN, CAD, chronic renal insufficiency, DM    PT Comments    Pt requiring min assist for mobility as well as supplemental oxygen (see below).  Spouse reports pt likely to refuse SNF however would like HHPT upon d/c.    Follow Up Recommendations  Home health PT;Supervision/Assistance - 24 hour   SATURATION QUALIFICATIONS: (This note is used to comply with regulatory documentation for home oxygen)  Patient Saturations on Room Air at Rest = 91%  Patient Saturations on Room Air while Ambulating = 86%  Patient Saturations on 3 Liters of oxygen while Ambulating = 88-91%  Please briefly explain why patient needs home oxygen: to maintain oxygen saturations above 88% during functional tasks such as ambulation   Equipment Recommendations  Rolling walker with 5" wheels    Recommendations for Other Services       Precautions / Restrictions Precautions Precautions: Fall Precaution Comments: monitor sats    Mobility  Bed Mobility Overal bed mobility: Needs Assistance Bed Mobility: Supine to Sit     Supine to sit: Supervision;HOB elevated        Transfers Overall transfer level: Needs assistance Equipment used: Rolling walker (2 wheeled) Transfers: Sit to/from Stand Sit to Stand: Min assist         General transfer comment: assist to rise and steady, verbal cues for safe technique  Ambulation/Gait Ambulation/Gait assistance: Min assist Ambulation Distance (Feet): 100 Feet Assistive device: Rolling walker (2 wheeled) Gait Pattern/deviations: Step-through pattern     General Gait Details: assist for steadying with turning, verbal cues for pursed lip breathing and rest break, dyspnea 3/4 requiring rest break and 3L O2   Stairs            Wheelchair  Mobility    Modified Rankin (Stroke Patients Only)       Balance                                    Cognition Arousal/Alertness: Awake/alert Behavior During Therapy: WFL for tasks assessed/performed Overall Cognitive Status: Within Functional Limits for tasks assessed                      Exercises      General Comments        Pertinent Vitals/Pain Pain Assessment: 0-10 Pain Score: 4  Pain Location: lower abdomen Pain Descriptors / Indicators: Sore Pain Intervention(s): Monitored during session;Repositioned    Home Living                      Prior Function            PT Goals (current goals can now be found in the care plan section) Progress towards PT goals: Progressing toward goals    Frequency  Min 3X/week    PT Plan Current plan remains appropriate    Co-evaluation             End of Session Equipment Utilized During Treatment: Oxygen Activity Tolerance: Patient limited by fatigue Patient left: with call bell/phone within reach;in chair;with family/visitor present     Time: 1131-1144 PT Time Calculation (min) (ACUTE ONLY): 13 min  Charges:  $Gait Training: 8-22 mins  G Codes:      Arjay Jaskiewicz,KATHrine E 2015/08/05, 1:28 PM Carmelia Bake, PT, DPT 08-05-2015 Pager: [phone]

## 2015-07-10 DIAGNOSIS — N390 Urinary tract infection, site not specified: Principal | ICD-10-CM

## 2015-07-10 DIAGNOSIS — I5033 Acute on chronic diastolic (congestive) heart failure: Secondary | ICD-10-CM

## 2015-07-10 LAB — BASIC METABOLIC PANEL
ANION GAP: 8 (ref 5–15)
BUN: 65 mg/dL — ABNORMAL HIGH (ref 6–20)
CO2: 26 mmol/L (ref 22–32)
Calcium: 9.6 mg/dL (ref 8.9–10.3)
Chloride: 102 mmol/L (ref 101–111)
Creatinine, Ser: 1.85 mg/dL — ABNORMAL HIGH (ref 0.61–1.24)
GFR calc non Af Amer: 32 mL/min — ABNORMAL LOW (ref >60)
GFR, EST AFRICAN AMERICAN: 37 mL/min — AB (ref >60)
GLUCOSE: 183 mg/dL — AB (ref 65–99)
POTASSIUM: 4.3 mmol/L (ref 3.5–5.1)
SODIUM: 136 mmol/L (ref 135–145)

## 2015-07-10 LAB — CBC
HEMATOCRIT: 26.3 % — AB (ref 39.0–52.0)
Hemoglobin: 8.6 g/dL — ABNORMAL LOW (ref 13.0–17.0)
MCH: 30.4 pg (ref 26.0–34.0)
MCHC: 32.7 g/dL (ref 30.0–36.0)
MCV: 92.9 fL (ref 78.0–100.0)
Platelets: 260 10*3/uL (ref 150–400)
RBC: 2.83 MIL/uL — ABNORMAL LOW (ref 4.22–5.81)
RDW: 14.5 % (ref 11.5–15.5)
WBC: 12.5 10*3/uL — AB (ref 4.0–10.5)

## 2015-07-10 LAB — GLUCOSE, CAPILLARY
GLUCOSE-CAPILLARY: 137 mg/dL — AB (ref 65–99)
GLUCOSE-CAPILLARY: 144 mg/dL — AB (ref 65–99)
Glucose-Capillary: 181 mg/dL — ABNORMAL HIGH (ref 65–99)
Glucose-Capillary: 178 mg/dL — ABNORMAL HIGH (ref 65–99)

## 2015-07-10 MED ORDER — IPRATROPIUM-ALBUTEROL 0.5-2.5 (3) MG/3ML IN SOLN
3.0000 mL | Freq: Three times a day (TID) | RESPIRATORY_TRACT | Status: DC
  Administered 2015-07-11 – 2015-07-12 (×5): 3 mL via RESPIRATORY_TRACT
  Filled 2015-07-10 (×5): qty 3

## 2015-07-10 MED ORDER — BISACODYL 10 MG RE SUPP
10.0000 mg | Freq: Once | RECTAL | Status: AC
  Administered 2015-07-10: 10 mg via RECTAL
  Filled 2015-07-10: qty 1

## 2015-07-10 MED ORDER — POLYETHYLENE GLYCOL 3350 17 G PO PACK
17.0000 g | PACK | Freq: Every day | ORAL | Status: DC
  Administered 2015-07-11 – 2015-07-12 (×2): 17 g via ORAL
  Filled 2015-07-10 (×2): qty 1

## 2015-07-10 MED ORDER — GI COCKTAIL ~~LOC~~
30.0000 mL | Freq: Three times a day (TID) | ORAL | Status: DC | PRN
  Filled 2015-07-10: qty 30

## 2015-07-10 MED ORDER — ALBUTEROL SULFATE (2.5 MG/3ML) 0.083% IN NEBU: 2.5000 mg | INHALATION_SOLUTION | Freq: Four times a day (QID) | RESPIRATORY_TRACT | Status: DC | PRN

## 2015-07-10 MED ORDER — PREDNISONE 50 MG PO TABS
60.0000 mg | ORAL_TABLET | Freq: Every day | ORAL | Status: DC
  Administered 2015-07-11 – 2015-07-12 (×2): 60 mg via ORAL
  Filled 2015-07-10 (×4): qty 1

## 2015-07-10 MED ORDER — IPRATROPIUM-ALBUTEROL 0.5-2.5 (3) MG/3ML IN SOLN
3.0000 mL | Freq: Four times a day (QID) | RESPIRATORY_TRACT | Status: DC
  Administered 2015-07-10: 3 mL via RESPIRATORY_TRACT
  Filled 2015-07-10: qty 3

## 2015-07-10 MED ORDER — FAMOTIDINE 20 MG PO TABS
20.0000 mg | ORAL_TABLET | Freq: Two times a day (BID) | ORAL | Status: DC
  Administered 2015-07-10 – 2015-07-12 (×4): 20 mg via ORAL
  Filled 2015-07-10 (×5): qty 1

## 2015-07-10 NOTE — Progress Notes (Addendum)
[facility] PROGRESS NOTE  Andrew [name] [id number] DOB: 30-Jun-1928 DOA: 07/06/2015 PCP: [name], MD  Brief Summary  Patient is an 81 year old with history of paroxysmal atrial fibrillation, chronic diastolic CHF, DM type II, essential hypertension, hyperlipidemia who presented with lower abdominal discomfort. Diagnosed with UTI and AKI on a secondary to dehydration and concurrent use of Lasix and ARB.  Assessment/Plan  UTI (lower urinary tract infection), present at time of admission -Continue Rocephin -E. Coli pansensitive - Most likely source of abdominal discomfort given negative right upper quadrant ultrasound  AKI (acute kidney injury), creatinine now stable near 2-2.2 - minimize nephrotoxins and renally dose medications - will need f/u with PCP and nephrology -  No acidosis or hyperkalemia  Acute hypoxic respiratory failure secondary to Acute on chronic diastolic CHF (congestive heart failure)  -  85 kg and only -935 mL -  Weight is a proximally stable -  Continue Lasix 60 mg IV 3 times a day -  Trend creatinine carefully  Although patient does not have a history of asthma, he states that the breathing treatments of helping him so will do a trial of treatment for acute asthma/COPD exacerbation -  Start scheduled duo nebs -  Start prednisone daily -  Already on antibiotics for urinary tract infection  Hyperlipidemia with target LDL less than 100 - continue home regimen, stable  Essential hypertension - continue to hold ARB due to recent AKI  DM (diabetes mellitus), type 2 with renal complications, CBG well controlled - continue diabetic diet and sliding scale insulin  Paroxysmal atrial fibrillation - Continue anticoagulation with eliquis although will have to be careful given his elevated serum creatinine and current creatinine clearance.  - Rate controlled with amiodarone  Anemia of chronic renal disease, hemoglobin stable at 8.6mg /dl  Mild  leukocytosis, trending down with antibiotics -  Repeat CBC in AM  GERD with indigestion, start PPI and GI cocktail when necessary  Constipation, start MiraLAX, bisacodyl  Diet:  diabetic Access:  PIV IVF:  off Proph:  apixaban  Code Status: full Family Communication: patient alone Disposition Plan: pending further diuresis to help with breathing.  HH PT with 24h supervision and rolling walker with 5" wheels.  Will need home O2   Consultants:  None  Procedures:  None  Antibiotics:  Rocephin 7/11 >   HPI/Subjective:  Still SOB at rest.  Has cough, wheeze, and chest tightness that are essentially unchanged from yesterday. Had some heartburn. Still no bowel movement since admission.   Objective: Filed Vitals:   07/10/15 0508 07/10/15 1202 07/10/15 1310 07/10/15 1411  BP: 143/65  138/65   Pulse: 76  98   Temp: 98.4 F (36.9 C)  97.7 F (36.5 C)   TempSrc: Oral  Oral   Resp: 18  16   Height:      Weight: 85.276 kg (188 lb)     SpO2: 94% 96% 93% 85%    Intake/Output Summary (Last 24 hours) at 07/10/15 2020 Last data filed at 07/10/15 1800  Gross per 24 hour  Intake    480 ml  Output   1500 ml  Net  -1020 ml   Filed Weights   07/09/15 0748 07/09/15 1230 07/10/15 0508  Weight: 42.3 kg (93 lb 4.1 oz) 85.276 kg (188 lb) 85.276 kg (188 lb)   Body mass index is 28.59 kg/(m^2).  Exam:   General:  Overweight male, still breathless, audible rales and wheeze from across the room  HEENT:  NCAT, MMM  Cardiovascular:  IRRR, nl S1, S2 no mrg, 2+ pulses, warm extremities  Respiratory:  Rales at the bases and wheeze throughout, very diminished throughout, no rhonchi  Abdomen:   NABS, soft, moderately distended, nontender  MSK:   Normal tone and bulk, no LEE  Neuro:  Grossly intact  Data Reviewed: Basic Metabolic Panel:  Recent Labs Lab 07/06/15 0855 07/07/15 0408 07/08/15 0417 07/09/15 0405 07/10/15 0350  NA 133* 136 138 138 136  K 4.4 4.3 4.1 4.4  4.3  CL 100* 104 105 103 102  CO2 23 22 24 25 26   GLUCOSE 177* 143* 165* 160* 183*  BUN 56* 51* 65* 66* 65*  CREATININE 2.75* 2.02* 2.18* 2.01* 1.85*  CALCIUM 8.9 8.2* 9.0 9.5 9.6  MG 2.2  --   --   --   --   PHOS 2.2*  --   --   --   --    Liver Function Tests:  Recent Labs Lab 07/06/15 0855 07/07/15 0408  AST 28 40  ALT 27 33  ALKPHOS 31* 28*  BILITOT 0.7 0.6  PROT 7.4 7.0  ALBUMIN 3.4* 3.0*    Recent Labs Lab 07/06/15 0855  LIPASE 18*   No results for input(s): AMMONIA in the last 168 hours. CBC:  Recent Labs Lab 07/06/15 0855 07/07/15 0408 07/08/15 0417 07/09/15 0405 07/10/15 0350  WBC 14.7* 13.8* 13.7* 13.4* 12.5*  NEUTROABS 12.9*  --   --   --   --   HGB 9.1* 8.2* 8.6* 8.8* 8.6*  HCT 27.5* 25.7* 25.6* 27.1* 26.3*  MCV 95.5 94.1 94.8 94.1 92.9  PLT 182 153 201 238 260    Recent Results (from the past 240 hour(s))  Urine culture     Status: None   Collection Time: 07/05/15 10:52 AM  Result Value Ref Range Status   Culture ESCHERICHIA COLI  Final   Colony Count >=100,000 COLONIES/ML  Final   Organism ID, Bacteria ESCHERICHIA COLI  Final      Susceptibility   Escherichia coli -  (no method available)    AMPICILLIN <=2 Sensitive     AMOX/CLAVULANIC <=2 Sensitive     AMPICILLIN/SULBACTAM <=2 Sensitive     PIP/TAZO <=4 Sensitive     IMIPENEM <=0.25 Sensitive     CEFAZOLIN <=4 Sensitive     CEFTRIAXONE <=1 Sensitive     CEFTAZIDIME <=1 Sensitive     CEFEPIME <=1 Sensitive     GENTAMICIN <=1 Sensitive     TOBRAMYCIN <=1 Sensitive     CIPROFLOXACIN <=0.25 Sensitive     LEVOFLOXACIN <=0.12 Sensitive     NITROFURANTOIN <=16 Sensitive     TRIMETH/SULFA <=20 Sensitive   Blood culture (routine x 2)     Status: None (Preliminary result)   Collection Time: 07/06/15  8:30 AM  Result Value Ref Range Status   Specimen Description BLOOD RIGHT ANTECUBITAL  Final   Special Requests BOTTLES DRAWN AEROBIC AND ANAEROBIC 5ML  Final   Culture   Final    NO  GROWTH 4 DAYS Performed at [facility]    Report Status PENDING  Incomplete  Blood culture (routine x 2)     Status: None (Preliminary result)   Collection Time: 07/06/15  8:35 AM  Result Value Ref Range Status   Specimen Description BLOOD RIGHT FOREARM  Final   Special Requests BOTTLES DRAWN AEROBIC AND ANAEROBIC 5ML  Final   Culture   Final    NO GROWTH 4 DAYS Performed at [facility]    Report Status PENDING  Incomplete  Urine culture     Status: None   Collection Time: 07/06/15 10:57 AM  Result Value Ref Range Status   Specimen Description URINE, CLEAN CATCH  Final   Special Requests NONE  Final   Culture   Final    >=100,000 COLONIES/mL ESCHERICHIA COLI Performed at [facility]    Report Status 07/08/2015 FINAL  Final   Organism ID, Bacteria ESCHERICHIA COLI  Final      Susceptibility   Escherichia coli - MIC*    AMPICILLIN <=2 SENSITIVE Sensitive     CEFAZOLIN <=4 SENSITIVE Sensitive     CEFTRIAXONE <=1 SENSITIVE Sensitive     CIPROFLOXACIN <=0.25 SENSITIVE Sensitive     GENTAMICIN <=1 SENSITIVE Sensitive     IMIPENEM <=0.25 SENSITIVE Sensitive     NITROFURANTOIN <=16 SENSITIVE Sensitive     TRIMETH/SULFA <=20 SENSITIVE Sensitive     AMPICILLIN/SULBACTAM <=2 SENSITIVE Sensitive     PIP/TAZO <=4 SENSITIVE Sensitive     * >=100,000 COLONIES/mL ESCHERICHIA COLI     Studies: No results found.  Scheduled Meds: . amiodarone  200 mg Oral Daily  . antiseptic oral rinse  7 mL Mouth Rinse BID  . apixaban  2.5 mg Oral BID  . aspirin EC  81 mg Oral Daily  . atorvastatin  80 mg Oral q1800  . cefTRIAXone (ROCEPHIN)  IV  1 g Intravenous Q24H  . cilostazol  100 mg Oral BID  . ezetimibe  10 mg Oral Daily  . famotidine  20 mg Oral BID  . fenofibrate  160 mg Oral Daily  . FLUoxetine  20 mg Oral Daily  . furosemide  60 mg Intravenous TID  . insulin aspart  0-15 Units Subcutaneous TID WC  . ipratropium-albuterol  3 mL Nebulization QID  . NIFEdipine   30 mg Oral Daily  . omega-3 acid ethyl esters  1 g Oral BID  . polyethylene glycol  17 g Oral Daily  . potassium chloride  20 mEq Oral Daily  . predniSONE  60 mg Oral Q breakfast  . tamsulosin  0.4 mg Oral QPC supper   Continuous Infusions:   Active Problems:   Hyperlipidemia with target LDL less than 100   Essential hypertension   Chronic renal insufficiency, stage II (mild)   DM (diabetes mellitus), type 2 with renal complications   Chronic diastolic CHF (congestive heart failure)   Paroxysmal atrial fibrillation   UTI (lower urinary tract infection)   AKI (acute kidney injury)   Acute on chronic diastolic heart failure    Time spent: 30 min    Cason Luffman, [place] [facility] Pager [phone]. If 7PM-7AM, please contact night-coverage at [email or web address], password [facility] 07/10/2015, 8:20 PM  LOS: 4 days

## 2015-07-10 NOTE — Progress Notes (Signed)
Physical Therapy Treatment Patient Details Name: Andrew [name] MRN: [id number] DOB: 1928-08-12 Today's Date: 07/10/2015    History of Present Illness 81 yo male admitted with UTI, weakness. Hx of Afib, CHF, AAA, HTN, CAD, chronic renal insufficiency, DM    PT Comments    Pt ambulated 150' with RW and 4L O2, SaO2 90%. SaO2 86% on RA walking. Pt reports he's feeling a bit better. He tolerated increased gait distance today.   Follow Up Recommendations  Home health PT;Supervision/Assistance - 24 hour     Equipment Recommendations  Rolling walker with 5" wheels;Other (comment) (oxygen)    Recommendations for Other Services       Precautions / Restrictions Precautions Precautions: Fall Precaution Comments: monitor sats Restrictions Weight Bearing Restrictions: No    Mobility  Bed Mobility Overal bed mobility: Modified Independent Bed Mobility: Supine to Sit           General bed mobility comments: HOB up, used rail  Transfers Overall transfer level: Needs assistance Equipment used: Rolling walker (2 wheeled) Transfers: Sit to/from Stand Sit to Stand: Min guard         General transfer comment: verbal cues for hand placment  Ambulation/Gait Ambulation/Gait assistance: Supervision Ambulation Distance (Feet): 150 Feet Assistive device: Rolling walker (2 wheeled) Gait Pattern/deviations: Step-through pattern   Gait velocity interpretation: Below normal speed for age/gender General Gait Details: 90% on 4L O2 [place], cues for pursed lip breathing, no LOB, 1/4 dyspnea   Stairs            Wheelchair Mobility    Modified Rankin (Stroke Patients Only)       Balance     Sitting balance-Leahy Scale: Good       Standing balance-Leahy Scale: Poor                      Cognition Arousal/Alertness: Awake/alert Behavior During Therapy: WFL for tasks assessed/performed Overall Cognitive Status: Within Functional Limits for tasks assessed                       Exercises      General Comments        Pertinent Vitals/Pain Pain Score: 3  Pain Location: lower abdomen Pain Descriptors / Indicators: Sore Pain Intervention(s): Monitored during session;Limited activity within patient's tolerance    Home Living                      Prior Function            PT Goals (current goals can now be found in the care plan section) Acute Rehab PT Goals Patient Stated 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, 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 PT Goal Formulation: With patient/family Time For Goal Achievement: 07/21/15 Potential to Achieve Goals: Good Progress towards PT goals: Progressing toward goals    Frequency  Min 3X/week    PT Plan Current plan remains appropriate    Co-evaluation             End of Session Equipment Utilized During Treatment: Oxygen Activity Tolerance: Patient tolerated treatment well Patient left: with call bell/phone within reach;in chair;with family/visitor present     Time: 1340-1410 PT Time Calculation (min) (ACUTE ONLY): 30 min  Charges:  $Gait Training: 8-22 mins $Therapeutic Activity: 8-22 mins                    G Codes:  Blondell Reveal Kistler 07/10/2015, 2:27 PM [phone]

## 2015-07-10 NOTE — Progress Notes (Signed)
SATURATION QUALIFICATIONS: (This note is used to comply with regulatory documentation for home oxygen)  Patient Saturations on Room Air at Rest = 90%  Patient Saturations on Room Air while Ambulating = 85%  Patient Saturations on 4 Liters of oxygen while Ambulating = 90%  Please briefly explain why patient needs home oxygen: to maintain SaO2 levels greater than 90%.  Blondell Reveal Kistler PT 07/10/2015  [phone]

## 2015-07-11 ENCOUNTER — Inpatient Hospital Stay (HOSPITAL_COMMUNITY): Payer: Medicare PPO

## 2015-07-11 DIAGNOSIS — R1030 Lower abdominal pain, unspecified: Secondary | ICD-10-CM | POA: Insufficient documentation

## 2015-07-11 DIAGNOSIS — R1031 Right lower quadrant pain: Secondary | ICD-10-CM

## 2015-07-11 LAB — CULTURE, BLOOD (ROUTINE X 2)
Culture: NO GROWTH
Culture: NO GROWTH

## 2015-07-11 LAB — GLUCOSE, CAPILLARY
GLUCOSE-CAPILLARY: 338 mg/dL — AB (ref 65–99)
Glucose-Capillary: 140 mg/dL — ABNORMAL HIGH (ref 65–99)
Glucose-Capillary: 238 mg/dL — ABNORMAL HIGH (ref 65–99)
Glucose-Capillary: 380 mg/dL — ABNORMAL HIGH (ref 65–99)

## 2015-07-11 LAB — BASIC METABOLIC PANEL
ANION GAP: 9 (ref 5–15)
BUN: 60 mg/dL — ABNORMAL HIGH (ref 6–20)
CALCIUM: 9.1 mg/dL (ref 8.9–10.3)
CO2: 28 mmol/L (ref 22–32)
Chloride: 101 mmol/L (ref 101–111)
Creatinine, Ser: 1.73 mg/dL — ABNORMAL HIGH (ref 0.61–1.24)
GFR calc Af Amer: 40 mL/min — ABNORMAL LOW (ref >60)
GFR, EST NON AFRICAN AMERICAN: 34 mL/min — AB (ref >60)
Glucose, Bld: 154 mg/dL — ABNORMAL HIGH (ref 65–99)
POTASSIUM: 3.4 mmol/L — AB (ref 3.5–5.1)
SODIUM: 138 mmol/L (ref 135–145)

## 2015-07-11 LAB — CBC
HCT: 25.7 % — ABNORMAL LOW (ref 39.0–52.0)
HEMOGLOBIN: 8.8 g/dL — AB (ref 13.0–17.0)
MCH: 31.5 pg (ref 26.0–34.0)
MCHC: 34.2 g/dL (ref 30.0–36.0)
MCV: 92.1 fL (ref 78.0–100.0)
PLATELETS: 337 10*3/uL (ref 150–400)
RBC: 2.79 MIL/uL — AB (ref 4.22–5.81)
RDW: 14.4 % (ref 11.5–15.5)
WBC: 11.7 10*3/uL — ABNORMAL HIGH (ref 4.0–10.5)

## 2015-07-11 MED ORDER — SENNOSIDES-DOCUSATE SODIUM 8.6-50 MG PO TABS
1.0000 | ORAL_TABLET | Freq: Two times a day (BID) | ORAL | Status: DC
  Administered 2015-07-11 – 2015-07-12 (×3): 1 via ORAL
  Filled 2015-07-11 (×4): qty 1

## 2015-07-11 MED ORDER — POTASSIUM CHLORIDE CRYS ER 20 MEQ PO TBCR
40.0000 meq | EXTENDED_RELEASE_TABLET | Freq: Every day | ORAL | Status: DC
  Administered 2015-07-12: 40 meq via ORAL
  Filled 2015-07-11: qty 2

## 2015-07-11 NOTE — Progress Notes (Signed)
[facility] PROGRESS NOTE  Andrew [name] [id number] DOB: 03/20/1928 DOA: 07/06/2015 PCP: [name], MD  Brief Summary  Patient is an 81 year old with history of paroxysmal atrial fibrillation, chronic diastolic CHF, DM type II, essential hypertension, hyperlipidemia who presented with lower abdominal discomfort. Diagnosed with UTI and AKI on a secondary to dehydration and concurrent use of Lasix and ARB.  Assessment/Plan  UTI (lower urinary tract infection), present at time of admission -Continue Rocephin, day 6 of 7 -E. Coli pansensitive  RLQ pain persistent despite treatment of UTI, relief of constipation - KUB:  Distended loops of bowel without obvious obstruction -  CT abd/pelvis with oral and without IV contrast to eval for appendicitis, hernia, diverticulitis, obstruction  AKI (acute kidney injury), creatinine trending down with diuresis - minimize nephrotoxins and renally dose medications - will need f/u with PCP and nephrology -  No acidosis or hyperkalemia  Acute hypoxic respiratory failure secondary to Acute on chronic diastolic CHF (congestive heart failure), states breathing is getting better -  CXR:  Worsening infiltrates -  85 kg, -1.8L -  Continue Lasix 60 mg IV 3 times a day -  Trend creatinine carefully  Although patient does not have a history of asthma, he states that the breathing treatments of helping him so will do a trial of treatment for acute asthma/COPD exacerbation -  continue scheduled duo nebs -  Continue prednisone daily day 2 -  Already on antibiotics for urinary tract infection, tomorrow is last day  Hyperlipidemia with target LDL less than 100 - continue home regimen, stable  Essential hypertension - continue to hold ARB due to recent AKI  DM (diabetes mellitus), type 2 with renal complications, CBG well controlled - continue diabetic diet and sliding scale insulin  Paroxysmal atrial fibrillation - Continue anticoagulation  with eliquis although will have to be careful given his elevated serum creatinine and current creatinine clearance.  - Rate controlled with amiodarone  Anemia of chronic renal disease, hemoglobin stable at 8.6mg /dl  Mild leukocytosis, trending down with antibiotics -  Repeat CBC in AM  GERD with indigestion, start PPI and GI cocktail when necessary  Constipation, continue MiraLAX, bisacodyl -  Schedule senna-colace  Hypokalemia due to diuresis -  Increase to potassium 34meq daily  Diet:  diabetic Access:  PIV IVF:  off Proph:  apixaban  Code Status: full Family Communication: patient alone Disposition Plan: pending further diuresis to help with breathing.  CT ab/pelvis. HH PT with 24h supervision and rolling walker with 5" wheels.  Will need home O2   Consultants:  None  Procedures:  None  Antibiotics:  Rocephin 7/11 >   HPI/Subjective:  States heartburn and breathing are better.  He had a large BM yesterday.  He continues to have severe pain in the RLQ that has not improved since admission despite BM, passing flatus, and treatment for UTI.  It is preventing him from sleeping.    Objective: Filed Vitals:   07/11/15 0504 07/11/15 0914 07/11/15 0944 07/11/15 1304  BP: 134/55  136/50 133/60  Pulse: 74  75 73  Temp: 98.4 F (36.9 C)   98.3 F (36.8 C)  TempSrc: Oral   Oral  Resp: 16   16  Height:      Weight: 85.004 kg (187 lb 6.4 oz)     SpO2: 94% 95%  96%    Intake/Output Summary (Last 24 hours) at 07/11/15 1512 Last data filed at 07/11/15 1304  Gross per 24 hour  Intake  960 ml  Output   2400 ml  Net  -1440 ml   Filed Weights   07/09/15 1230 07/10/15 0508 07/11/15 0504  Weight: 85.276 kg (188 lb) 85.276 kg (188 lb) 85.004 kg (187 lb 6.4 oz)   Body mass index is 28.5 kg/(m^2).  Exam:   General:  Overweight male, mild increased WOB after talking for a while  HEENT:  NCAT, MMM  Cardiovascular:  IRRR, nl S1, S2 no mrg, 2+ pulses, warm  extremities  Respiratory:  Very diminished throughout, no rhonchi, rales.  Scattered wheeze  Abdomen:   NABS, soft, moderately distended, nontender  MSK:   Normal tone and bulk, no LEE  Neuro:  Grossly intact  Data Reviewed: Basic Metabolic Panel:  Recent Labs Lab 07/06/15 0855 07/07/15 0408 07/08/15 0417 07/09/15 0405 07/10/15 0350 07/11/15 0530  NA 133* 136 138 138 136 138  K 4.4 4.3 4.1 4.4 4.3 3.4*  CL 100* 104 105 103 102 101  CO2 23 22 24 25 26 28   GLUCOSE 177* 143* 165* 160* 183* 154*  BUN 56* 51* 65* 66* 65* 60*  CREATININE 2.75* 2.02* 2.18* 2.01* 1.85* 1.73*  CALCIUM 8.9 8.2* 9.0 9.5 9.6 9.1  MG 2.2  --   --   --   --   --   PHOS 2.2*  --   --   --   --   --    Liver Function Tests:  Recent Labs Lab 07/06/15 0855 07/07/15 0408  AST 28 40  ALT 27 33  ALKPHOS 31* 28*  BILITOT 0.7 0.6  PROT 7.4 7.0  ALBUMIN 3.4* 3.0*    Recent Labs Lab 07/06/15 0855  LIPASE 18*   No results for input(s): AMMONIA in the last 168 hours. CBC:  Recent Labs Lab 07/06/15 0855 07/07/15 0408 07/08/15 0417 07/09/15 0405 07/10/15 0350 07/11/15 0530  WBC 14.7* 13.8* 13.7* 13.4* 12.5* 11.7*  NEUTROABS 12.9*  --   --   --   --   --   HGB 9.1* 8.2* 8.6* 8.8* 8.6* 8.8*  HCT 27.5* 25.7* 25.6* 27.1* 26.3* 25.7*  MCV 95.5 94.1 94.8 94.1 92.9 92.1  PLT 182 153 201 238 260 337    Recent Results (from the past 240 hour(s))  Urine culture     Status: None   Collection Time: 07/05/15 10:52 AM  Result Value Ref Range Status   Culture ESCHERICHIA COLI  Final   Colony Count >=100,000 COLONIES/ML  Final   Organism ID, Bacteria ESCHERICHIA COLI  Final      Susceptibility   Escherichia coli -  (no method available)    AMPICILLIN <=2 Sensitive     AMOX/CLAVULANIC <=2 Sensitive     AMPICILLIN/SULBACTAM <=2 Sensitive     PIP/TAZO <=4 Sensitive     IMIPENEM <=0.25 Sensitive     CEFAZOLIN <=4 Sensitive     CEFTRIAXONE <=1 Sensitive     CEFTAZIDIME <=1 Sensitive     CEFEPIME  <=1 Sensitive     GENTAMICIN <=1 Sensitive     TOBRAMYCIN <=1 Sensitive     CIPROFLOXACIN <=0.25 Sensitive     LEVOFLOXACIN <=0.12 Sensitive     NITROFURANTOIN <=16 Sensitive     TRIMETH/SULFA <=20 Sensitive   Blood culture (routine x 2)     Status: None   Collection Time: 07/06/15  8:30 AM  Result Value Ref Range Status   Specimen Description BLOOD RIGHT ANTECUBITAL  Final   Special Requests BOTTLES DRAWN AEROBIC AND ANAEROBIC 5ML  Final   Culture   Final    NO GROWTH 5 DAYS Performed at [facility]    Report Status 07/11/2015 FINAL  Final  Blood culture (routine x 2)     Status: None   Collection Time: 07/06/15  8:35 AM  Result Value Ref Range Status   Specimen Description BLOOD RIGHT FOREARM  Final   Special Requests BOTTLES DRAWN AEROBIC AND ANAEROBIC 5ML  Final   Culture   Final    NO GROWTH 5 DAYS Performed at [facility]    Report Status 07/11/2015 FINAL  Final  Urine culture     Status: None   Collection Time: 07/06/15 10:57 AM  Result Value Ref Range Status   Specimen Description URINE, CLEAN CATCH  Final   Special Requests NONE  Final   Culture   Final    >=100,000 COLONIES/mL ESCHERICHIA COLI Performed at [facility]    Report Status 07/08/2015 FINAL  Final   Organism ID, Bacteria ESCHERICHIA COLI  Final      Susceptibility   Escherichia coli - MIC*    AMPICILLIN <=2 SENSITIVE Sensitive     CEFAZOLIN <=4 SENSITIVE Sensitive     CEFTRIAXONE <=1 SENSITIVE Sensitive     CIPROFLOXACIN <=0.25 SENSITIVE Sensitive     GENTAMICIN <=1 SENSITIVE Sensitive     IMIPENEM <=0.25 SENSITIVE Sensitive     NITROFURANTOIN <=16 SENSITIVE Sensitive     TRIMETH/SULFA <=20 SENSITIVE Sensitive     AMPICILLIN/SULBACTAM <=2 SENSITIVE Sensitive     PIP/TAZO <=4 SENSITIVE Sensitive     * >=100,000 COLONIES/mL ESCHERICHIA COLI     Studies: Dg Chest Port 1 View  07/11/2015   CLINICAL DATA:  Dyspnea. Ongoing shortness of breath. No chest pain, cough,  congestion. History of CAD, cardiac stents, previous smoker.  EXAM: PORTABLE CHEST - 1 VIEW  COMPARISON:  07/08/2015  FINDINGS: Status post median sternotomy. Heart is mildly enlarged. There are patchy densities within the lungs bilaterally, more confluent at the left lung base and right mid lung zone compared prior studies. Findings favor infectious process.  IMPRESSION: Developing bilateral infiltrates, favoring infectious process.   Electronically Signed   By: Nolon Nations M.D.   On: 07/11/2015 08:24   Dg Abd Portable 1v  07/11/2015   CLINICAL DATA:  Right abdominal pain for 2 days.  EXAM: PORTABLE ABDOMEN - 1 VIEW  COMPARISON:  01/24/2013  FINDINGS: There is generalized increased bowel gas, but no evidence of obstruction. No convincing renal or ureteral stones. There are aortoiliac femoral vascular calcifications. Degenerative changes noted throughout the visualized spine.  IMPRESSION: Generalized increased bowel gas, but no evidence of bowel obstruction. No acute finding.   Electronically Signed   By: Lajean Manes M.D.   On: 07/11/2015 14:12    Scheduled Meds: . amiodarone  200 mg Oral Daily  . antiseptic oral rinse  7 mL Mouth Rinse BID  . apixaban  2.5 mg Oral BID  . aspirin EC  81 mg Oral Daily  . atorvastatin  80 mg Oral q1800  . cefTRIAXone (ROCEPHIN)  IV  1 g Intravenous Q24H  . cilostazol  100 mg Oral BID  . ezetimibe  10 mg Oral Daily  . famotidine  20 mg Oral BID  . fenofibrate  160 mg Oral Daily  . FLUoxetine  20 mg Oral Daily  . furosemide  60 mg Intravenous TID  . insulin aspart  0-15 Units Subcutaneous TID WC  . ipratropium-albuterol  3 mL Nebulization  TID  . NIFEdipine  30 mg Oral Daily  . omega-3 acid ethyl esters  1 g Oral BID  . polyethylene glycol  17 g Oral Daily  . potassium chloride  20 mEq Oral Daily  . predniSONE  60 mg Oral Q breakfast  . tamsulosin  0.4 mg Oral QPC supper   Continuous Infusions:   Active Problems:   Hyperlipidemia with target LDL less  than 100   Essential hypertension   Chronic renal insufficiency, stage II (mild)   DM (diabetes mellitus), type 2 with renal complications   Chronic diastolic CHF (congestive heart failure)   Paroxysmal atrial fibrillation   UTI (lower urinary tract infection)   AKI (acute kidney injury)   Acute on chronic diastolic heart failure    Time spent: 30 min    Parish Dubose, [facility] Pager [phone]. If 7PM-7AM, please contact night-coverage at [email or web address], password [email or web address] 07/11/2015, 3:12 PM  LOS: 5 days

## 2015-07-12 ENCOUNTER — Inpatient Hospital Stay (HOSPITAL_COMMUNITY): Payer: Medicare PPO

## 2015-07-12 ENCOUNTER — Other Ambulatory Visit: Payer: Self-pay | Admitting: Internal Medicine

## 2015-07-12 DIAGNOSIS — K5900 Constipation, unspecified: Secondary | ICD-10-CM

## 2015-07-12 DIAGNOSIS — J9601 Acute respiratory failure with hypoxia: Secondary | ICD-10-CM

## 2015-07-12 DIAGNOSIS — R911 Solitary pulmonary nodule: Secondary | ICD-10-CM

## 2015-07-12 DIAGNOSIS — N200 Calculus of kidney: Secondary | ICD-10-CM

## 2015-07-12 DIAGNOSIS — I10 Essential (primary) hypertension: Secondary | ICD-10-CM

## 2015-07-12 DIAGNOSIS — N281 Cyst of kidney, acquired: Secondary | ICD-10-CM

## 2015-07-12 DIAGNOSIS — K5901 Slow transit constipation: Secondary | ICD-10-CM

## 2015-07-12 LAB — BASIC METABOLIC PANEL
Anion gap: 10 (ref 5–15)
BUN: 63 mg/dL — AB (ref 6–20)
CALCIUM: 8.6 mg/dL — AB (ref 8.9–10.3)
CO2: 29 mmol/L (ref 22–32)
Chloride: 98 mmol/L — ABNORMAL LOW (ref 101–111)
Creatinine, Ser: 1.85 mg/dL — ABNORMAL HIGH (ref 0.61–1.24)
GFR calc non Af Amer: 32 mL/min — ABNORMAL LOW (ref >60)
GFR, EST AFRICAN AMERICAN: 37 mL/min — AB (ref >60)
GLUCOSE: 295 mg/dL — AB (ref 65–99)
Potassium: 3.5 mmol/L (ref 3.5–5.1)
SODIUM: 137 mmol/L (ref 135–145)

## 2015-07-12 LAB — GLUCOSE, CAPILLARY
GLUCOSE-CAPILLARY: 153 mg/dL — AB (ref 65–99)
Glucose-Capillary: 210 mg/dL — ABNORMAL HIGH (ref 65–99)

## 2015-07-12 LAB — CBC
HCT: 23.6 % — ABNORMAL LOW (ref 39.0–52.0)
Hemoglobin: 8.2 g/dL — ABNORMAL LOW (ref 13.0–17.0)
MCH: 31.9 pg (ref 26.0–34.0)
MCHC: 34.7 g/dL (ref 30.0–36.0)
MCV: 91.8 fL (ref 78.0–100.0)
PLATELETS: 342 10*3/uL (ref 150–400)
RBC: 2.57 MIL/uL — ABNORMAL LOW (ref 4.22–5.81)
RDW: 14.7 % (ref 11.5–15.5)
WBC: 11.7 10*3/uL — AB (ref 4.0–10.5)

## 2015-07-12 MED ORDER — FUROSEMIDE 40 MG PO TABS: 60.0000 mg | ORAL_TABLET | Freq: Two times a day (BID) | ORAL | Status: DC

## 2015-07-12 MED ORDER — ALBUTEROL SULFATE HFA 108 (90 BASE) MCG/ACT IN AERS: 2.0000 | INHALATION_SPRAY | RESPIRATORY_TRACT | Status: DC | PRN

## 2015-07-12 MED ORDER — POTASSIUM CHLORIDE CRYS ER 20 MEQ PO TBCR: 20.0000 meq | EXTENDED_RELEASE_TABLET | Freq: Every day | ORAL | Status: DC

## 2015-07-12 MED ORDER — IOHEXOL 300 MG/ML  SOLN
50.0000 mL | Freq: Once | INTRAMUSCULAR | Status: AC | PRN
  Administered 2015-07-12: 50 mL via ORAL

## 2015-07-12 MED ORDER — PREDNISONE 20 MG PO TABS: 40.0000 mg | ORAL_TABLET | Freq: Every day | ORAL | Status: DC

## 2015-07-12 MED ORDER — POLYETHYLENE GLYCOL 3350 17 GM/SCOOP PO POWD: 17.0000 g | Freq: Two times a day (BID) | ORAL | Status: DC

## 2015-07-12 NOTE — Care Management Note (Signed)
Case Management Note  Patient Details  Name: NOLON HANNEMAN MRN: [id number] Date of Birth: March 10, 1928  Subjective/Objective:        UTI, CHF, AKI            Action/Plan: oxygen  Expected Discharge Date:  07/12/2015              Expected Discharge Plan:  Home/Self Care  In-House Referral:     Discharge planning Services  CM Consult  Post Acute Care Choice:    Choice offered to:     DME Arranged:  Oxygen DME Agency:  [place]:  Patient Refused [facility] Agency:     Status of Service:  Completed, signed off  Medicare Important Message Given:  Yes-second notification given Date Medicare IM Given:    Medicare IM give by:    Date Additional Medicare IM Given:    Additional Medicare Important Message give by:     If discussed at [place] of Stay Meetings, dates discussed:    Additional Comments: Consulted for oxygen. NCM spoke to pt and explained [facility] will bring portable oxygen to his room and concentrator to his home once he arrives. Pt refusing HH at this time. Has RW at home.  Erenest Rasher, RN 07/12/2015, 2:20 PM

## 2015-07-12 NOTE — Progress Notes (Signed)
Pt after consulting with wife is agreeable to [facility]. Wife requesting [facility] for [facility]. Contacted [facility] for [facility] for home. Jonnie Finner RN CCM Case Mgmt phone [phone]

## 2015-07-12 NOTE — Discharge Summary (Signed)
Physician Discharge Summary  Andrew [name] [id number] DOB: November 01, 1928 DOA: 07/06/2015  PCP: [name], MD  Admit date: 07/06/2015 Discharge date: 07/12/2015  Recommendations for Outpatient Follow-up:  1. F/u with primary care doctor:  Has AAA that vascular surgery could monitor.  Radiologist recommending repeat abd [place] in 3 years, however, I believe this is chronic and already followed by vascular surgery.  RML lung nodule:  Recommending repeat CT chest in 6-12 months.  Bilateral renal cysts, not well characterized on this exam.  Consider [place] or noncontrast MRI (given kidney function) to better clarify.  Please refer for PFTs and sleep study 2. F/u with cardiology for heart failure exacerbation and weight check.  I stopped his fenofibrate due to borderline kidney function (and pill burden) and increased his lasix to 60mg  po BID.  I held his losartan due to his AKI for now but could be resumed at next visit if creatinine stable.   3. Started [name] prednisone burst and combivent inhaler - consider outpatient PFTs 4. Constipation - recommend treatment with multiple dose miralax  Discharge Diagnoses:  Principal Problem:   UTI (lower urinary tract infection) Active Problems:   Hyperlipidemia with target LDL less than 100   Essential hypertension   Chronic renal insufficiency, stage II (mild)   DM (diabetes mellitus), type 2 with renal complications   AAA (abdominal aortic aneurysm) without rupture   Chronic diastolic CHF (congestive heart failure)   Paroxysmal atrial fibrillation   AKI (acute kidney injury)   Acute on chronic diastolic heart failure   RLQ abdominal pain   Bilateral renal cysts   Lung nodule, 57mm RML CT 07/12/15   Constipation   Kidney stone on right side   Acute respiratory failure with hypoxia   Discharge Condition: stable, improved  Diet recommendation: diabetic diet  Wt Readings from Last 3 Encounters:  07/12/15 82.101 kg (181 lb)  07/05/15 81.92 kg (180 lb  9.6 oz)  06/30/15 83.235 kg (183 lb 8 oz)    History of present illness:  Patient is an 81 year old with history of paroxysmal atrial fibrillation, chronic diastolic CHF, DM type II, essential hypertension, hyperlipidemia who presented with lower abdominal discomfort. Diagnosed with UTI and AKI on a secondary to dehydration and concurrent use of Lasix and ARB.  Hospital Course:   RLQ pain was likely multifactorial.  He has chronic constipation and had partial relief after BM.  He may have had a kidney stone and/or pyelonephritis on the right which were demonstrated on CT scan on 7/17.  His pain resolved sponteneously on 7/16 and did not recur, making me think that he likely passed a kidney stone which responsible for the majority of his discomfort.    UTI (lower urinary tract infection), present at time of admission with pansensitive E.coli.  Finished 7-day course of antibiotics in hospital.    AKI (acute kidney injury), creatinine trended down with diuresis and passing of kidney stone.  No acidosis or hyperkalemia.  Acute hypoxic respiratory failure secondary to Acute on chronic diastolic CHF (congestive heart failure).  He developed SOB after receiving IVF for treatment of his AKI.  He was diuresed and had some improvement in his breathing but given his persistent wheeze and evidence of bronchial thickening on CXR, he was also started on treatment for acute COPD exacerbation with steroids and nebulizer treatments, which helped.  He had evidence of infiltrates on CXR, but I feel that this was due to pulmonary edema for heart failure and am less suspicious for  infection.  He responded to lasix 60mg  IV TID and his dose was increased to 60mg  po BID at discharge.  He was advised to weigh himself daily and call Dr. [name] if he gains more than 3-lbs in one day or 5-lbs in 1 week.  Regardless, he should f/u with cardiology in 1 week. He was set up for home oxygen.  PCP to please refer for PFTs since I am  not convinced that his symptoms were from "bronchitis."   Hyperlipidemia with target LDL less than 100, continued home regimen, stable  Essential hypertension, continue to hold ARB due to recent AKI.  F/u in 1-2 weeks for repeat BMP and if creatinine back to baseline, consider resuming ARB.  DM (diabetes mellitus), type 2 with renal complications, CBG well controlled.  Resume home regimen.   Paroxysmal atrial fibrillation, rate controlled on amiodarone.  Continued anticoagulation with eliquis.    Anemia of chronic renal disease, hemoglobin stable. Defer to PCP.    Mild leukocytosis, trended down with antibiotics  GERD with indigestion, start PPI and GI cocktail when necessary  Constipation, continue MiraLAX, bisacodyl.  Scheduled senna-colace  Hypokalemia due to diuresis.  Resolved with oral potassium supplementation.    Consultants:  None  Procedures:  CT abd/pelvis noncon:  Stranding on right side c/w passed kidney stone or pyelonephritis, AAA, RML lung nodule, multiple renal cysts which were not well characterized  Antibiotics:  Rocephin 7/11 > 7/17  Discharge Exam: Filed Vitals:   07/12/15 0452  BP: 131/62  Pulse: 85  Temp: 97.5 F (36.4 C)  Resp: 16   Filed Vitals:   07/11/15 2031 07/12/15 0452 07/12/15 0750 07/12/15 1222  BP: 125/55 131/62    Pulse: 74 85    Temp: 98.5 F (36.9 C) 97.5 F (36.4 C)    TempSrc: Oral Oral    Resp: 16 16    Height:      Weight:  40.9 kg (90 lb 2.7 oz)  82.101 kg (181 lb)  SpO2: 97% 97% 97%      General: Overweight male, NAD  HEENT: NCAT, MMM  Cardiovascular: IRRR, nl S1, S2 no mrg, 2+ pulses, warm extremities  Respiratory: Very diminished, no wheezes, rhonchi, rales.  Abdomen: NABS, soft, moderately distended, mild TTP in the left lateral abdomen over colon that feels full of stool.  MSK: Normal tone and bulk, no LEE  Neuro: Grossly intact  Discharge Instructions      Discharge Instructions     (HEART FAILURE PATIENTS) Call MD:  Anytime you have any of the following symptoms: 1) 3 pound weight gain in 24 hours or 5 pounds in 1 week 2) shortness of breath, with or without a dry hacking cough 3) swelling in the hands, feet or stomach 4) if you have to sleep on extra pillows at night in order to breathe.    Complete by:  As directed      Call MD for:  difficulty breathing, headache or visual disturbances    Complete by:  As directed      Call MD for:  extreme fatigue    Complete by:  As directed      Call MD for:  hives    Complete by:  As directed      Call MD for:  persistant dizziness or light-headedness    Complete by:  As directed      Call MD for:  persistant nausea and vomiting    Complete by:  As directed  Call MD for:  severe uncontrolled pain    Complete by:  As directed      Call MD for:  temperature >100.4    Complete by:  As directed      Diet - low sodium heart healthy    Complete by:  As directed      Diet Carb Modified    Complete by:  As directed      Discharge instructions    Complete by:  As directed   You likely had a urinary tract infection, probable kidney stone, and some constipation which caused your abdominal pain.  You have completed treatment for your urinary tract infection and you have passed your kidney stone.  For constipation, please take miralax 17gm twice a day and use either bisacodyl or senna (both available over the counter) to help have at least one bowel movement per day.  Please increase your lasix to 60mg  twice a day (one and a half tabs twice a day) until you can follow up with Dr. [name].  Please call the cardiology office on Monday to schedule an appointment.  Because your kidneys were not working well earlier during your hospitalization, please stop your losartan and your fenofibrate until your bloodwork can be repeated by either your primary care doctor or Dr. [name].  They will tell you when it may be safe to restart these medications.      Increase activity slowly    Complete by:  As directed             Medication List    STOP taking these medications        fenofibrate 145 MG tablet  Commonly known as:  TRICOR     levofloxacin 500 MG tablet  Commonly known as:  LEVAQUIN     losartan 100 MG tablet  Commonly known as:  COZAAR     temazepam 30 MG capsule  Commonly known as:  RESTORIL      TAKE these medications        acetaminophen 500 MG tablet  Commonly known as:  TYLENOL  Take 1,000 mg by mouth 3 (three) times daily.     albuterol 108 (90 BASE) MCG/ACT inhaler  Commonly known as:  PROVENTIL HFA;VENTOLIN HFA  Inhale 2 puffs into the lungs every 4 (four) hours as needed for wheezing or shortness of breath.     ALPRAZolam 0.5 MG tablet  Commonly known as:  XANAX  Take 0.5 tablets (0.25 mg total) by mouth 2 (two) times daily as needed for anxiety.     amiodarone 200 MG tablet  Commonly known as:  PACERONE  Take 1 tablet (200 mg total) by mouth daily.     apixaban 2.5 MG Tabs tablet  Commonly known as:  ELIQUIS  Take 1 tablet (2.5 mg total) by mouth 2 (two) times daily.     aspirin EC 81 MG tablet  Take 1 tablet (81 mg total) by mouth daily.     atorvastatin 80 MG tablet  Commonly known as:  LIPITOR  Take 1 tablet (80 mg total) by mouth daily.     cilostazol 100 MG tablet  Commonly known as:  PLETAL  TAKE 1 TABLET TWICE DAILY.     CO Q 10 PO  Take 1 tablet by mouth daily.     erythromycin with ethanol 2 % external solution  Commonly known as:  THERAMYCIN  Apply 1 application topically daily as needed (for skin irritation around neck).  ezetimibe 10 MG tablet  Commonly known as:  ZETIA  Take 10 mg by mouth daily.     FLUoxetine 20 MG capsule  Commonly known as:  PROZAC  Take 1 capsule (20 mg total) by mouth daily.     furosemide 40 MG tablet  Commonly known as:  LASIX  Take 1.5 tablets (60 mg total) by mouth 2 (two) times daily.     GENTEAL MILD 0.2 % Soln  Generic drug:   Hypromellose  Place 1 drop into both eyes daily as needed (for dry eyes).     glucosamine-chondroitin 500-400 MG tablet  Take 1 tablet by mouth 2 (two) times daily.     multivitamin tablet  Take 1 tablet by mouth daily.     NF FORMULAS TESTOSTERONE Caps  Take 1 capsule by mouth 2 (two) times daily.     NIFEdipine 30 MG 24 hr tablet  Commonly known as:  PROCARDIA-XL/ADALAT-CC/NIFEDICAL-XL  Take 30 mg by mouth daily.     nitroGLYCERIN 0.4 MG SL tablet  Commonly known as:  NITROSTAT  Place 0.4 mg under the tongue every 5 (five) minutes as needed for chest pain.     omega-3 acid ethyl esters 1 G capsule  Commonly known as:  LOVAZA  Take 1 g by mouth 2 (two) times daily.     polyethylene glycol powder powder  Commonly known as:  GLYCOLAX/MIRALAX  Take 17 g by mouth 2 (two) times daily.     potassium chloride SA 20 MEQ tablet  Commonly known as:  K-DUR,KLOR-CON  Take 1 tablet (20 mEq total) by mouth daily.     predniSONE 20 MG tablet  Commonly known as:  DELTASONE  Take 2 tablets (40 mg total) by mouth daily with breakfast.     tamsulosin 0.4 MG Caps capsule  Commonly known as:  FLOMAX  Take 0.4 mg by mouth daily after supper.       Follow-up Information    Follow up with [name], MD. Schedule an appointment as soon as possible for a visit in 1 week.   Specialty:  Internal Medicine   Contact information:   [address] [address] [phone]       Follow up with Loralie Champagne, MD. Schedule an appointment as soon as possible for a visit in 2 weeks.   Specialty:  Cardiology   Contact information:   [id number] [place] [address] [phone]        The results of significant diagnostics from this hospitalization (including imaging, microbiology, ancillary and laboratory) are listed below for reference.    Significant Diagnostic Studies: Ct Abdomen Pelvis Wo Contrast  07/12/2015   CLINICAL DATA:  Right  lower quadrant abdominal pain  EXAM: CT ABDOMEN AND PELVIS WITHOUT CONTRAST  TECHNIQUE: Multidetector CT imaging of the abdomen and pelvis was performed following the standard protocol without IV contrast.  COMPARISON:  1/14/8  FINDINGS: Lower chest: Small pleural effusions and mild pleural thickening is identified in the lung bases, left greater than right. There is a nodule identified in the right middle lobe measuring 6 mm, image 2/series 4.  Hepatobiliary: No focal liver abnormality. Gallbladder appears unremarkable. No biliary dilatation.  Pancreas: Normal appearance of the pancreas.  Spleen: The spleen is unremarkable.  Adrenals/Urinary Tract: The adrenal glands are both normal. Bilateral renal cysts are noted which are incompletely characterized without IV contrast. The right kidney contains several large renal sinus cysts. The largest measures  9 cm, image 93/series 5. Previously this measured 6.2 cm. Several hyper dense cysts are noted within both kidneys. The largest arises from the upper pole of the right kidney measuring 1 cm, image 76/series 5. Bilateral nephrolithiasis noted. The largest right renal calculus is in the inferior pole measuring 4 mm, image 82/series 5 the largest left renal calculus is in the inferior pole measuring 2-3 mm, image 104/ series 5. Bilateral renal vascular calcifications noted. Right-sided perinephric fat stranding has increased from previous exam and is indeterminate. No right-sided hydronephrosis or hydroureter noted. No left-sided hydronephrosis or hydroureter. The urinary bladder appears normal for degree of distention.  Stomach/Bowel: Small hiatal hernia. The small bowel loops have a normal course and caliber without evidence for bowel obstruction. The appendix is visualized and appears normal. Moderate stool burden identified within the colon. There is no evidence for bowel obstruction.  Vascular/Lymphatic: Calcified atherosclerotic disease involves the abdominal aorta.  The infrarenal abdominal aorta measures 3.2 cm in maximum AP dimension. No enlarged retroperitoneal or mesenteric adenopathy. No enlarged pelvic or inguinal lymph nodes.  Reproductive: Prostate gland and seminal vesicles are unremarkable.  Other: There is no ascites or focal fluid collections within the abdomen or pelvis.  Musculoskeletal: Degenerative disc disease is noted within the lumbar spine. There is a anterolisthesis of L5 on S1. Bilateral L5 pars defects noted.  IMPRESSION: 1. Bilateral nephrolithiasis without definite evidence for obstructive uropathy. 2. There is increase in nonspecific right-sided perinephric fat stranding. This may be a manifestation of pyelonephritis but could also be seen with passage of urinary tract calculi. 3. Bilateral renal cysts including a large right-sided parapelvic cyst measuring 9 cm. Cysts are of varying density and are incompletely characterized without IV contrast material. 4. Aortic atherosclerosis 5. Infrarenal abdominal aortic aneurysm. Recommend followup by ultrasound in 3 years. This recommendation follows ACR consensus guidelines: White Paper of the ACR Incidental Findings Committee II on Vascular Findings. J Am Coll Radiol 2013; [id number] 6. Lumbar spondylosis. 7. Bilateral L5 pars defects with anterolisthesis of L5 on S1. 8. Right middle lobe nodule measures 6 mm. If the patient is at high risk for bronchogenic carcinoma, follow-up chest CT at 6-12 months is recommended. If the patient is at low risk for bronchogenic carcinoma, follow-up chest CT at 12 months is recommended. This recommendation follows the consensus statement: Guidelines for Management of Small Pulmonary Nodules Detected on CT Scans: A Statement from the [place] as published in Radiology 2005;237:395-400.   Electronically Signed   By: Kerby Moors M.D.   On: 07/12/2015 10:08   Dg Chest 2 View  07/05/2015   CLINICAL DATA:  Fever and chills for 2 days.  EXAM: CHEST  2 VIEW   COMPARISON:  04/04/2013 and prior chest radiographs  FINDINGS: The cardiomediastinal silhouette is unremarkable.  CABG changes again noted.  Mild bibasilar scarring and elevation of the right hemidiaphragm are unchanged.  There is no evidence of focal airspace disease, pulmonary edema, suspicious pulmonary nodule/mass, pleural effusion, or pneumothorax. No acute bony abnormalities are identified.  IMPRESSION: No active cardiopulmonary disease.   Electronically Signed   By: Margarette Canada M.D.   On: 07/05/2015 11:53   Dg Lumbar Spine 2-3 Views  07/05/2015   CLINICAL DATA:  Two month history of back pain.  EXAM: LUMBAR SPINE - 2-3 VIEW  COMPARISON:  CT scan from 2008.  FINDINGS: Severe degenerative lumbar spondylosis with severe multilevel disc disease and facet disease. This appears progressive since the prior CT scan. There is  also chronic L5 spondylolisthesis due to bilateral pars defects. No acute bony findings. Moderate aortoiliac calcifications are noted without definite aneurysm.  IMPRESSION: Severe degenerative lumbar spondylosis with severe multilevel disc disease and facet disease.  Bilateral pars defects at L5 with a grade 1 spondylolisthesis.  No acute bony findings.   Electronically Signed   By: Marijo Sanes M.D.   On: 07/05/2015 11:56   Dg Chest Port 1 View  07/11/2015   CLINICAL DATA:  Dyspnea. Ongoing shortness of breath. No chest pain, cough, congestion. History of CAD, cardiac stents, previous smoker.  EXAM: PORTABLE CHEST - 1 VIEW  COMPARISON:  07/08/2015  FINDINGS: Status post median sternotomy. Heart is mildly enlarged. There are patchy densities within the lungs bilaterally, more confluent at the left lung base and right mid lung zone compared prior studies. Findings favor infectious process.  IMPRESSION: Developing bilateral infiltrates, favoring infectious process.   Electronically Signed   By: Nolon Nations M.D.   On: 07/11/2015 08:24   Dg Chest Port 1 View  07/08/2015   CLINICAL  DATA:  Wheezing.  Nonproductive cough.  Fever.  EXAM: PORTABLE CHEST - 1 VIEW  COMPARISON:  07/05/2015  FINDINGS: Previous median sternotomy and CABG procedure. Aortic atherosclerosis noted. There is asymmetric elevation of right hemidiaphragm. Diffuse bronchial wall thickening is noted bilaterally. Atelectasis and volume loss is identified in the right lung base.  IMPRESSION: 1. Diffuse bronchitic changes. 2. Atelectasis and volume loss within the right lung base.   Electronically Signed   By: Kerby Moors M.D.   On: 07/08/2015 14:19   Dg Abd Portable 1v  07/11/2015   CLINICAL DATA:  Right abdominal pain for 2 days.  EXAM: PORTABLE ABDOMEN - 1 VIEW  COMPARISON:  01/24/2013  FINDINGS: There is generalized increased bowel gas, but no evidence of obstruction. No convincing renal or ureteral stones. There are aortoiliac femoral vascular calcifications. Degenerative changes noted throughout the visualized spine.  IMPRESSION: Generalized increased bowel gas, but no evidence of bowel obstruction. No acute finding.   Electronically Signed   By: Lajean Manes M.D.   On: 07/11/2015 14:12   US Abdomen Limited Ruq  07/06/2015   CLINICAL DATA:  Right upper quadrant pain x3 weeks, intermittent fever x5 days  EXAM: US ABDOMEN LIMITED - RIGHT UPPER QUADRANT  COMPARISON:  CT abdomen pelvis dated 01/08/2007  FINDINGS: Gallbladder:  5 mm gallstone in the gallbladder neck. No gallbladder wall thickening or pericholecystic fluid. Negative sonographic Murphy's sign.  Common bile duct:  Diameter: 3 mm  Liver:  No focal lesion identified. Within normal limits in parenchymal echogenicity.  Additional comments:  Severe right hydronephrosis, chronic.  IMPRESSION: 5 mm gallstone, without associated sonographic findings suggest acute cholecystitis.  Severe right hydronephrosis, chronic.   Electronically Signed   By: Julian Hy M.D.   On: 07/06/2015 10:26    Microbiology: Recent Results (from the past 240 hour(s))  Urine  culture     Status: None   Collection Time: 07/05/15 10:52 AM  Result Value Ref Range Status   Culture ESCHERICHIA COLI  Final   Colony Count >=100,000 COLONIES/ML  Final   Organism ID, Bacteria ESCHERICHIA COLI  Final      Susceptibility   Escherichia coli -  (no method available)    AMPICILLIN <=2 Sensitive     AMOX/CLAVULANIC <=2 Sensitive     AMPICILLIN/SULBACTAM <=2 Sensitive     PIP/TAZO <=4 Sensitive     IMIPENEM <=0.25 Sensitive     CEFAZOLIN <=4  Sensitive     CEFTRIAXONE <=1 Sensitive     CEFTAZIDIME <=1 Sensitive     CEFEPIME <=1 Sensitive     GENTAMICIN <=1 Sensitive     TOBRAMYCIN <=1 Sensitive     CIPROFLOXACIN <=0.25 Sensitive     LEVOFLOXACIN <=0.12 Sensitive     NITROFURANTOIN <=16 Sensitive     TRIMETH/SULFA <=20 Sensitive   Blood culture (routine x 2)     Status: None   Collection Time: 07/06/15  8:30 AM  Result Value Ref Range Status   Specimen Description BLOOD RIGHT ANTECUBITAL  Final   Special Requests BOTTLES DRAWN AEROBIC AND ANAEROBIC 5ML  Final   Culture   Final    NO GROWTH 5 DAYS Performed at [facility]    Report Status 07/11/2015 FINAL  Final  Blood culture (routine x 2)     Status: None   Collection Time: 07/06/15  8:35 AM  Result Value Ref Range Status   Specimen Description BLOOD RIGHT FOREARM  Final   Special Requests BOTTLES DRAWN AEROBIC AND ANAEROBIC 5ML  Final   Culture   Final    NO GROWTH 5 DAYS Performed at [facility]    Report Status 07/11/2015 FINAL  Final  Urine culture     Status: None   Collection Time: 07/06/15 10:57 AM  Result Value Ref Range Status   Specimen Description URINE, CLEAN CATCH  Final   Special Requests NONE  Final   Culture   Final    >=100,000 COLONIES/mL ESCHERICHIA COLI Performed at [facility]    Report Status 07/08/2015 FINAL  Final   Organism ID, Bacteria ESCHERICHIA COLI  Final      Susceptibility   Escherichia coli - MIC*    AMPICILLIN <=2 SENSITIVE Sensitive      CEFAZOLIN <=4 SENSITIVE Sensitive     CEFTRIAXONE <=1 SENSITIVE Sensitive     CIPROFLOXACIN <=0.25 SENSITIVE Sensitive     GENTAMICIN <=1 SENSITIVE Sensitive     IMIPENEM <=0.25 SENSITIVE Sensitive     NITROFURANTOIN <=16 SENSITIVE Sensitive     TRIMETH/SULFA <=20 SENSITIVE Sensitive     AMPICILLIN/SULBACTAM <=2 SENSITIVE Sensitive     PIP/TAZO <=4 SENSITIVE Sensitive     * >=100,000 COLONIES/mL ESCHERICHIA COLI     Labs: Basic Metabolic Panel:  Recent Labs Lab 07/06/15 0855  07/08/15 0417 07/09/15 0405 07/10/15 0350 07/11/15 0530 07/12/15 0418  NA 133*  < > 138 138 136 138 137  K 4.4  < > 4.1 4.4 4.3 3.4* 3.5  CL 100*  < > 105 103 102 101 98*  CO2 23  < > 24 25 26 28 29   GLUCOSE 177*  < > 165* 160* 183* 154* 295*  BUN 56*  < > 65* 66* 65* 60* 63*  CREATININE 2.75*  < > 2.18* 2.01* 1.85* 1.73* 1.85*  CALCIUM 8.9  < > 9.0 9.5 9.6 9.1 8.6*  MG 2.2  --   --   --   --   --   --   PHOS 2.2*  --   --   --   --   --   --   < > = values in this interval not displayed. Liver Function Tests:  Recent Labs Lab 07/06/15 0855 07/07/15 0408  AST 28 40  ALT 27 33  ALKPHOS 31* 28*  BILITOT 0.7 0.6  PROT 7.4 7.0  ALBUMIN 3.4* 3.0*    Recent Labs Lab 07/06/15 0855  LIPASE 18*  No results for input(s): AMMONIA in the last 168 hours. CBC:  Recent Labs Lab 07/06/15 0855  07/08/15 0417 07/09/15 0405 07/10/15 0350 07/11/15 0530 07/12/15 0418  WBC 14.7*  < > 13.7* 13.4* 12.5* 11.7* 11.7*  NEUTROABS 12.9*  --   --   --   --   --   --   HGB 9.1*  < > 8.6* 8.8* 8.6* 8.8* 8.2*  HCT 27.5*  < > 25.6* 27.1* 26.3* 25.7* 23.6*  MCV 95.5  < > 94.8 94.1 92.9 92.1 91.8  PLT 182  < > 201 238 260 337 342  < > = values in this interval not displayed. Cardiac Enzymes: No results for input(s): CKTOTAL, CKMB, CKMBINDEX, TROPONINI in the last 168 hours. BNP: BNP (last 3 results)  Recent Labs  05/27/15 1606 06/17/15 1050  BNP 321.7* 245.4*    ProBNP (last 3 results) No results  for input(s): PROBNP in the last 8760 hours.  CBG:  Recent Labs Lab 07/11/15 1145 07/11/15 1652 07/11/15 2130 07/12/15 0725 07/12/15 1149  GLUCAP 238* 338* 380* 210* 153*    Time coordinating discharge: 35 minutes  Signed:  Dulce Martian  [facility] 07/12/2015, 12:40 PM

## 2015-07-13 ENCOUNTER — Telehealth: Payer: Self-pay

## 2015-07-13 NOTE — Telephone Encounter (Signed)
Pt has an appt with PCP on Wednesday 07/15/15. DX was UTI and Dehydration.   Transition Care Management Follow-up Telephone Call  Date discharged? 07/12/15  How have you been since you were released from the hospital? Pt is doing okay. Still has some discomfort 3/10 pain scale.   Do you understand why you were in the hospital?  Pt states that he understands.   Do you understand the discharge instrcutions? Pt states that he understands.   Where were you discharged to? Home  Items Reviewed:  Medications reviewed: Pt understands all of his medications.   Allergies reviewed: No new allergies  Dietary changes reviewed: No changes  Referrals reviewed: Reviewed.   Functional Questionnaire:   Activities of Daily Living (ADLs):   He states they are independent in the following: ALL ADL's   States they require assistance with the following: no issues with ADL's  Any transportation issues/concerns?: no  Any patient concerns? Wants to know about what is causing kidney issues.   Confirmed importance and date/time of follow-up visits scheduled: Pt understands the importance.   Provider Appointment booked with Dr. Ronnald Ramp (PCP).   Confirmed with patient if condition begins to worsen call PCP or go to the ER.  Patient was given the office number and encouraged to call back with question or concerns: Pt knows to call.

## 2015-07-14 ENCOUNTER — Encounter: Payer: Self-pay | Admitting: Family

## 2015-07-15 ENCOUNTER — Encounter: Payer: Self-pay | Admitting: Internal Medicine

## 2015-07-15 ENCOUNTER — Other Ambulatory Visit (INDEPENDENT_AMBULATORY_CARE_PROVIDER_SITE_OTHER): Payer: Medicare PPO

## 2015-07-15 ENCOUNTER — Ambulatory Visit (INDEPENDENT_AMBULATORY_CARE_PROVIDER_SITE_OTHER): Payer: Medicare PPO | Admitting: Internal Medicine

## 2015-07-15 VITALS — BP 130/68 | HR 75 | Temp 98.5°F | Resp 16 | Ht 68.0 in | Wt 172.0 lb

## 2015-07-15 DIAGNOSIS — N179 Acute kidney failure, unspecified: Secondary | ICD-10-CM

## 2015-07-15 DIAGNOSIS — D510 Vitamin B12 deficiency anemia due to intrinsic factor deficiency: Secondary | ICD-10-CM | POA: Insufficient documentation

## 2015-07-15 DIAGNOSIS — N390 Urinary tract infection, site not specified: Secondary | ICD-10-CM

## 2015-07-15 LAB — CBC WITH DIFFERENTIAL/PLATELET
BASOS PCT: 0.2 % (ref 0.0–3.0)
Basophils Absolute: 0 10*3/uL (ref 0.0–0.1)
Eosinophils Absolute: 0.1 10*3/uL (ref 0.0–0.7)
Eosinophils Relative: 0.4 % (ref 0.0–5.0)
HCT: 29.9 % — ABNORMAL LOW (ref 39.0–52.0)
Hemoglobin: 10 g/dL — ABNORMAL LOW (ref 13.0–17.0)
LYMPHS PCT: 11.8 % — AB (ref 12.0–46.0)
Lymphs Abs: 1.7 10*3/uL (ref 0.7–4.0)
MCHC: 33.6 g/dL (ref 30.0–36.0)
MCV: 94.1 fl (ref 78.0–100.0)
Monocytes Absolute: 0.8 10*3/uL (ref 0.1–1.0)
Monocytes Relative: 5.5 % (ref 3.0–12.0)
NEUTROS PCT: 82.1 % — AB (ref 43.0–77.0)
Neutro Abs: 12 10*3/uL — ABNORMAL HIGH (ref 1.4–7.7)
Platelets: 524 10*3/uL — ABNORMAL HIGH (ref 150.0–400.0)
RBC: 3.18 Mil/uL — ABNORMAL LOW (ref 4.22–5.81)
RDW: 15.3 % (ref 11.5–15.5)
WBC: 14.6 10*3/uL — ABNORMAL HIGH (ref 4.0–10.5)

## 2015-07-15 LAB — URINALYSIS, ROUTINE W REFLEX MICROSCOPIC
Bilirubin Urine: NEGATIVE
Hgb urine dipstick: NEGATIVE
Ketones, ur: NEGATIVE
Leukocytes, UA: NEGATIVE
Nitrite: NEGATIVE
PH: 7 (ref 5.0–8.0)
RBC / HPF: NONE SEEN (ref 0–?)
Specific Gravity, Urine: 1.01 (ref 1.000–1.030)
TOTAL PROTEIN, URINE-UPE24: NEGATIVE
Urine Glucose: NEGATIVE
Urobilinogen, UA: 0.2 (ref 0.0–1.0)
WBC UA: NONE SEEN (ref 0–?)

## 2015-07-15 LAB — BASIC METABOLIC PANEL
BUN: 58 mg/dL — ABNORMAL HIGH (ref 6–23)
CO2: 38 mEq/L — ABNORMAL HIGH (ref 19–32)
CREATININE: 1.98 mg/dL — AB (ref 0.40–1.50)
Calcium: 10.1 mg/dL (ref 8.4–10.5)
Chloride: 94 mEq/L — ABNORMAL LOW (ref 96–112)
GFR: 34.33 mL/min — ABNORMAL LOW (ref >60.00)
Glucose, Bld: 131 mg/dL — ABNORMAL HIGH (ref 70–99)
Potassium: 4.2 mEq/L (ref 3.5–5.1)
Sodium: 140 mEq/L (ref 135–145)

## 2015-07-15 LAB — VITAMIN B12: Vitamin B-12: 1355 pg/mL — ABNORMAL HIGH (ref 211–911)

## 2015-07-15 LAB — FERRITIN: Ferritin: 319.8 ng/mL (ref 22.0–322.0)

## 2015-07-15 LAB — IBC PANEL
IRON: 81 ug/dL (ref 42–165)
Saturation Ratios: 21.7 % (ref 20.0–50.0)
Transferrin: 267 mg/dL (ref 212.0–360.0)

## 2015-07-15 LAB — FOLATE: Folate: 16 ng/mL (ref >5.9)

## 2015-07-15 LAB — FECAL OCCULT BLOOD, GUAIAC
Fecal Occult Blood: NEGATIVE
Fecal Occult Blood: POSITIVE

## 2015-07-15 NOTE — Progress Notes (Signed)
Pre visit review using our clinic review tool, if applicable. No additional management support is needed unless otherwise documented below in the visit note. 

## 2015-07-15 NOTE — Patient Instructions (Signed)
Anemia, Nonspecific Anemia is a condition in which the concentration of red blood cells or hemoglobin in the blood is below normal. Hemoglobin is a substance in red blood cells that carries oxygen to the tissues of the body. Anemia results in not enough oxygen reaching these tissues.  CAUSES  Common causes of anemia include:   Excessive bleeding. Bleeding may be internal or external. This includes excessive bleeding from periods (in women) or from the intestine.   Poor nutrition.   Chronic kidney, thyroid, and liver disease.  Bone marrow disorders that decrease red blood cell production.  Cancer and treatments for cancer.  HIV, AIDS, and their treatments.  Spleen problems that increase red blood cell destruction.  Blood disorders.  Excess destruction of red blood cells due to infection, medicines, and autoimmune disorders. SIGNS AND SYMPTOMS   Minor weakness.   Dizziness.   Headache.  Palpitations.   Shortness of breath, especially with exercise.   Paleness.  Cold sensitivity.  Indigestion.  Nausea.  Difficulty sleeping.  Difficulty concentrating. Symptoms may occur suddenly or they may develop slowly.  DIAGNOSIS  Additional blood tests are often needed. These help your health care provider determine the best treatment. Your health care provider will check your stool for blood and look for other causes of blood loss.  TREATMENT  Treatment varies depending on the cause of the anemia. Treatment can include:   Supplements of iron, vitamin B12, or folic acid.   Hormone medicines.   A blood transfusion. This may be needed if blood loss is severe.   Hospitalization. This may be needed if there is significant continual blood loss.   Dietary changes.  Spleen removal. HOME CARE INSTRUCTIONS Keep all follow-up appointments. It often takes many weeks to correct anemia, and having your health care provider check on your condition and your response to  treatment is very important. SEEK IMMEDIATE MEDICAL CARE IF:   You develop extreme weakness, shortness of breath, or chest pain.   You become dizzy or have trouble concentrating.  You develop heavy vaginal bleeding.   You develop a rash.   You have bloody or black, tarry stools.   You faint.   You vomit up blood.   You vomit repeatedly.   You have abdominal pain.  You have a fever or persistent symptoms for more than 2-3 days.   You have a fever and your symptoms suddenly get worse.   You are dehydrated.  MAKE SURE YOU:  Understand these instructions.  Will watch your condition.  Will get help right away if you are not doing well or get worse. Document Released: 01/19/2005 Document Revised: 08/14/2013 Document Reviewed: 06/07/2013 [employer] Patient Information 2015 [employer]. This information is not intended to replace advice given to you by your health care provider. Make sure you discuss any questions you have with your health care provider.  

## 2015-07-15 NOTE — Progress Notes (Signed)
Subjective:  Patient ID: Andrew [name], male    DOB: November 30, 1928  Age: 81 y.o. MRN: [id number]  CC: Urinary Tract Infection and Anemia   HPI Andrew [name] presents for a hospital admission follow-up. He was treated for urosepsis related to a right-sided kidney stone. He tells me that he feels much better, his only complaint today is mild weakness and fatigue.Marland Kitchen He was found to be anemic in the hospital but he does not report any signs of blood loss.  Outpatient Prescriptions Prior to Visit  Medication Sig Dispense Refill  . acetaminophen (TYLENOL) 500 MG tablet Take 1,000 mg by mouth 3 (three) times daily.    Marland Kitchen albuterol (PROVENTIL HFA;VENTOLIN HFA) 108 (90 BASE) MCG/ACT inhaler Inhale 2 puffs into the lungs every 4 (four) hours as needed for wheezing or shortness of breath. 1 Inhaler 0  . ALPRAZolam (XANAX) 0.5 MG tablet Take 0.5 tablets (0.25 mg total) by mouth 2 (two) times daily as needed for anxiety. 60 tablet 5  . amiodarone (PACERONE) 200 MG tablet Take 1 tablet (200 mg total) by mouth daily. 30 tablet 6  . apixaban (ELIQUIS) 2.5 MG TABS tablet Take 1 tablet (2.5 mg total) by mouth 2 (two) times daily. 60 tablet 3  . aspirin EC 81 MG tablet Take 1 tablet (81 mg total) by mouth daily.    Marland Kitchen atorvastatin (LIPITOR) 80 MG tablet Take 1 tablet (80 mg total) by mouth daily. 30 tablet 6  . cilostazol (PLETAL) 100 MG tablet TAKE 1 TABLET TWICE DAILY. 180 tablet 3  . Coenzyme Q10 (CO Q 10 PO) Take 1 tablet by mouth daily.    Marland Kitchen erythromycin with ethanol (THERAMYCIN) 2 % external solution Apply 1 application topically daily as needed (for skin irritation around neck).    . ezetimibe (ZETIA) 10 MG tablet Take 10 mg by mouth daily.    Marland Kitchen FLUoxetine (PROZAC) 20 MG capsule Take 1 capsule (20 mg total) by mouth daily. 90 capsule 3  . furosemide (LASIX) 40 MG tablet Take 1.5 tablets (60 mg total) by mouth 2 (two) times daily. 90 tablet 0  . glucosamine-chondroitin 500-400 MG tablet Take 1 tablet by  mouth 2 (two) times daily.     . Hypromellose (GENTEAL MILD) 0.2 % SOLN Place 1 drop into both eyes daily as needed (for dry eyes).     . Misc Natural Products (NF FORMULAS TESTOSTERONE) CAPS Take 1 capsule by mouth 2 (two) times daily.    . Multiple Vitamin (MULTIVITAMIN) tablet Take 1 tablet by mouth daily.    Marland Kitchen NIFEdipine (PROCARDIA-XL/ADALAT-CC/NIFEDICAL-XL) 30 MG 24 hr tablet Take 30 mg by mouth daily.    Marland Kitchen NITROSTAT 0.4 MG SL tablet DISSOLVE 1 TABLET UNDER TONGUE AS NEEDED FOR CHEST PAIN,MAY REPEAT IN5 MINUTES FOR 2 DOSES. 25 tablet 3  . omega-3 acid ethyl esters (LOVAZA) 1 G capsule Take 1 g by mouth 2 (two) times daily.    . polyethylene glycol powder (GLYCOLAX/MIRALAX) powder Take 17 g by mouth 2 (two) times daily. 850 g 0  . potassium chloride SA (K-DUR,KLOR-CON) 20 MEQ tablet Take 1 tablet (20 mEq total) by mouth daily. 30 tablet 0  . predniSONE (DELTASONE) 20 MG tablet Take 2 tablets (40 mg total) by mouth daily with breakfast. 4 tablet 0  . tamsulosin (FLOMAX) 0.4 MG CAPS capsule Take 0.4 mg by mouth daily after supper.     No facility-administered medications prior to visit.    ROS Review of Systems  Constitutional: Positive  for fatigue. Negative for fever, chills, diaphoresis, activity change, appetite change and unexpected weight change.  HENT: Negative.  Negative for trouble swallowing.   Eyes: Negative.   Respiratory: Negative.  Negative for cough, choking, chest tightness, shortness of breath and stridor.   Cardiovascular: Negative.  Negative for chest pain, palpitations and leg swelling.  Gastrointestinal: Negative.  Negative for nausea, vomiting, abdominal pain, diarrhea, constipation and blood in stool.  Endocrine: Negative.   Genitourinary: Negative.  Negative for dysuria, urgency, frequency, hematuria, flank pain, decreased urine volume, enuresis and difficulty urinating.  Musculoskeletal: Negative.  Negative for myalgias, back pain, joint swelling and arthralgias.    Skin: Negative.  Negative for color change and rash.  Allergic/Immunologic: Negative.   Neurological: Positive for weakness. Negative for dizziness, tremors, light-headedness and numbness.  Hematological: Negative.  Negative for adenopathy. Does not bruise/bleed easily.  Psychiatric/Behavioral: Negative.     Objective:  BP 130/68 mmHg  Pulse 75  Temp(Src) 98.5 F (36.9 C) (Oral)  Resp 16  Ht 5\' 8"  (1.727 m)  Wt 172 lb (78.019 kg)  BMI 26.16 kg/m2  SpO2 96%  BP Readings from Last 3 Encounters:  07/15/15 130/68  07/12/15 148/67  07/05/15 138/58    Wt Readings from Last 3 Encounters:  07/15/15 172 lb (78.019 kg)  07/12/15 181 lb (82.101 kg)  07/05/15 180 lb 9.6 oz (81.92 kg)    Physical Exam  Constitutional: He is oriented to person, place, and time.  Non-toxic appearance. He does not have a sickly appearance. He does not appear ill. No distress.  HENT:  Mouth/Throat: Oropharynx is clear and moist. No oropharyngeal exudate.  Eyes: Conjunctivae are normal. Right eye exhibits no discharge. Left eye exhibits no discharge. No scleral icterus.  Neck: Normal range of motion. Neck supple. No JVD present. No tracheal deviation present. No thyromegaly present.  Cardiovascular: Normal rate, regular rhythm, normal heart sounds and intact distal pulses.  Exam reveals no gallop and no friction rub.   No murmur heard. Pulmonary/Chest: Effort normal and breath sounds normal. No stridor. No respiratory distress. He has no wheezes. He has no rales. He exhibits no tenderness.  Abdominal: Soft. Normal appearance and bowel sounds are normal. He exhibits no distension and no mass. There is no hepatosplenomegaly, splenomegaly or hepatomegaly. There is no tenderness. There is no rigidity, no rebound, no guarding, no CVA tenderness, no tenderness at McBurney's point and negative Murphy's sign. Hernia confirmed negative in the right inguinal area and confirmed negative in the left inguinal area.   Genitourinary: Testes normal. Rectal exam shows no internal hemorrhoid, no fissure, no mass, no tenderness and anal tone normal. Guaiac negative stool. Prostate is enlarged (1+ smooth symm BPH). Prostate is not tender. Right testis shows no mass, no swelling and no tenderness. Right testis is descended. Left testis shows no mass, no swelling and no tenderness. Left testis is descended. No penile erythema or penile tenderness. No discharge found.  Musculoskeletal: Normal range of motion. He exhibits no edema or tenderness.  Lymphadenopathy:    He has no cervical adenopathy.       Right: No inguinal adenopathy present.       Left: No inguinal adenopathy present.  Neurological: He is oriented to person, place, and time.  Skin: Skin is warm and dry. No rash noted. He is not diaphoretic. No erythema. No pallor.  Psychiatric: He has a normal mood and affect. His behavior is normal. Judgment and thought content normal.    Lab Results  Component  Value Date   WBC 14.6* 07/15/2015   HGB 10.0* 07/15/2015   HCT 29.9* 07/15/2015   PLT 524.0* 07/15/2015   GLUCOSE 131* 07/15/2015   CHOL 110 01/19/2015   TRIG 57.0 01/19/2015   HDL 47.20 01/19/2015   LDLDIRECT 78.6 03/18/2010   LDLCALC 51 01/19/2015   ALT 33 07/07/2015   AST 40 07/07/2015   NA 140 07/15/2015   K 4.2 07/15/2015   CL 94* 07/15/2015   CREATININE 1.98* 07/15/2015   BUN 58* 07/15/2015   CO2 38* 07/15/2015   TSH 3.916 06/30/2015   PSA 2.79 01/23/2013   INR 1.0 10/28/2014   HGBA1C 6.4 03/23/2015   MICROALBUR 13.2* 03/23/2015    US Abdomen Limited Ruq  07/06/2015   CLINICAL DATA:  Right upper quadrant pain x3 weeks, intermittent fever x5 days  EXAM: US ABDOMEN LIMITED - RIGHT UPPER QUADRANT  COMPARISON:  CT abdomen pelvis dated 01/08/2007  FINDINGS: Gallbladder:  5 mm gallstone in the gallbladder neck. No gallbladder wall thickening or pericholecystic fluid. Negative sonographic Murphy's sign.  Common bile duct:  Diameter: 3 mm   Liver:  No focal lesion identified. Within normal limits in parenchymal echogenicity.  Additional comments:  Severe right hydronephrosis, chronic.  IMPRESSION: 5 mm gallstone, without associated sonographic findings suggest acute cholecystitis.  Severe right hydronephrosis, chronic.   Electronically Signed   By: Julian Hy M.D.   On: 07/06/2015 10:26    Assessment & Plan:   Andrew [name] was seen today for urinary tract infection and anemia.  Diagnoses and all orders for this visit:  AKI (acute kidney injury)- his renal function is stable, his electrolytes are normal with the exception of a low chloride, he will avoid nephrotoxic agents, will continue to maintain good blood pressure control. Orders: -     Basic metabolic panel; Future  UTI (lower urinary tract infection)- this appears to have resolved, will recheck his UA and urine culture and proceed if there is an abnormal result. Orders: -     Basic metabolic panel; Future -     Urinalysis, Routine w reflex microscopic (not at [facility]); Future -     CULTURE, URINE COMPREHENSIVE; Future  Pernicious anemia- his hemoglobin and hematocrit have improved, his white cell count continues to be slightly elevated but there is no evidence of infection at this time, his vitamin levels are normal. This may be the anemia of chronic disease or acute inflammation and I will follow for now. Orders: -     CBC with Differential/Platelet; Future -     IBC panel; Future -     Folate; Future -     Ferritin; Future -     Vitamin B12; Future  I am having Andrew [name] maintain his glucosamine-chondroitin, NIFEdipine, omega-3 acid ethyl esters, NF FORMULAS TESTOSTERONE, multivitamin, aspirin EC, tamsulosin, ezetimibe, Hypromellose, erythromycin with ethanol, atorvastatin, FLUoxetine, ALPRAZolam, apixaban, cilostazol, amiodarone, acetaminophen, Coenzyme Q10 (CO Q 10 PO), polyethylene glycol powder, predniSONE, potassium chloride SA, furosemide, albuterol, and  NITROSTAT.  No orders of the defined types were placed in this encounter.     Follow-up: Return in about 4 weeks (around 08/12/2015).  [name], MD

## 2015-07-16 ENCOUNTER — Telehealth: Payer: Self-pay | Admitting: Internal Medicine

## 2015-07-16 NOTE — Telephone Encounter (Signed)
Margaretha Sheffield from [facility] [phone]) called to let you know patient got stuck in crawl space under his house and ems had to help get him out. The checked him out and Margaretha Sheffield did a nurse check on him. He had no apparent injures besides soreness and small abrasion to left knee. Ems put a bandaid on it.

## 2015-07-17 ENCOUNTER — Ambulatory Visit (INDEPENDENT_AMBULATORY_CARE_PROVIDER_SITE_OTHER)
Admission: RE | Admit: 2015-07-17 | Discharge: 2015-07-17 | Disposition: A | Discharge status: Home / Self Care | Payer: Medicare PPO | Source: Ambulatory Visit | Attending: Family | Admitting: Family

## 2015-07-17 ENCOUNTER — Ambulatory Visit (HOSPITAL_COMMUNITY)
Admission: RE | Admit: 2015-07-17 | Discharge: 2015-07-17 | Disposition: A | Discharge status: Home / Self Care | Payer: Medicare PPO | Source: Ambulatory Visit | Attending: Family | Admitting: Family

## 2015-07-17 ENCOUNTER — Ambulatory Visit (INDEPENDENT_AMBULATORY_CARE_PROVIDER_SITE_OTHER): Payer: Medicare PPO | Admitting: Family

## 2015-07-17 ENCOUNTER — Encounter: Payer: Self-pay | Admitting: Family

## 2015-07-17 ENCOUNTER — Other Ambulatory Visit (HOSPITAL_COMMUNITY): Payer: Self-pay | Admitting: *Deleted

## 2015-07-17 VITALS — BP 117/61 | HR 84 | Temp 97.5°F | Resp 16 | Ht 66.0 in | Wt 166.0 lb

## 2015-07-17 DIAGNOSIS — I714 Abdominal aortic aneurysm, without rupture, unspecified: Secondary | ICD-10-CM

## 2015-07-17 DIAGNOSIS — Z8679 Personal history of other diseases of the circulatory system: Secondary | ICD-10-CM

## 2015-07-17 DIAGNOSIS — I739 Peripheral vascular disease, unspecified: Secondary | ICD-10-CM | POA: Diagnosis not present

## 2015-07-17 DIAGNOSIS — I6523 Occlusion and stenosis of bilateral carotid arteries: Secondary | ICD-10-CM

## 2015-07-17 DIAGNOSIS — I70219 Atherosclerosis of native arteries of extremities with intermittent claudication, unspecified extremity: Secondary | ICD-10-CM

## 2015-07-17 DIAGNOSIS — Z48812 Encounter for surgical aftercare following surgery on the circulatory system: Secondary | ICD-10-CM

## 2015-07-17 DIAGNOSIS — Z9889 Other specified postprocedural states: Secondary | ICD-10-CM

## 2015-07-17 DIAGNOSIS — I779 Disorder of arteries and arterioles, unspecified: Secondary | ICD-10-CM

## 2015-07-17 DIAGNOSIS — Z4889 Encounter for other specified surgical aftercare: Secondary | ICD-10-CM

## 2015-07-17 LAB — CULTURE, URINE COMPREHENSIVE
COLONY COUNT: NO GROWTH
Organism ID, Bacteria: NO GROWTH

## 2015-07-17 MED ORDER — AMIODARONE HCL 200 MG PO TABS: 200.0000 mg | ORAL_TABLET | Freq: Every day | ORAL | Status: DC

## 2015-07-17 NOTE — Patient Instructions (Signed)

## 2015-07-17 NOTE — Progress Notes (Signed)
[facility] [place] HISTORY AND PHYSICAL   MRN : [id number]  History of Present Illness:   Andrew [name] is a 81 y.o. male patient of Dr. Kellie Simmering who is status post AAA repair and left renal artery bypass graft in 1997 by Dr. Amedeo Plenty.  He also has PAD and carotid artery stenosis. He returns today for follow up. After walking a couple of blocks he has bilateral buttocks, thighs, and calves aching; this has been improving over time.  He denies any known lumbar spine problems. After resting for 30 seconds he can resume walking.  He does have known arthritis. He denies rest pain, denies non-healing wounds or ulcers.  Patient states that his blood pressure is usually 123456 systolic.  He had CHF in June 2016 while in [place], then had severe UTI early July 2016; since then he has had dehydration, anemia, weakness; feels he is slowly improving, is walking with a cane.  Patient has a negative history of TIA or stroke symptoms, specifically he denies amaurosis fugax or monocular blindness, denies facial drooping, denies hemiplegia, denies receptive or expressive aphasia.   The patient has not had sudden back or abdominal pain.  He was using a stair stepper exerciser three times/week before he became ill in June 2016.  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   Pt Diabetic: No, is now diet controlled  Pt smoker: former smoker, quit at age 16   Pt meds include:  Statin :Yes  Betablocker: No  ASA: Yes  Other anticoagulants/antiplatelets:  Pletal; Eliquis started early June 2016, prescribed by his cardiologist (per pt). It appears that he had transient atrial fib.    Current Outpatient Prescriptions  Medication Sig Dispense Refill  . acetaminophen (TYLENOL) 500 MG tablet Take 1,000 mg by mouth 3 (three) times daily.    Marland Kitchen albuterol (PROVENTIL HFA;VENTOLIN HFA) 108 (90 BASE) MCG/ACT inhaler Inhale 2 puffs into the lungs every 4 (four) hours as needed for wheezing or shortness  of breath. 1 Inhaler 0  . ALPRAZolam (XANAX) 0.5 MG tablet Take 0.5 tablets (0.25 mg total) by mouth 2 (two) times daily as needed for anxiety. 60 tablet 5  . amiodarone (PACERONE) 200 MG tablet Take 1 tablet (200 mg total) by mouth daily. 30 tablet 6  . apixaban (ELIQUIS) 2.5 MG TABS tablet Take 1 tablet (2.5 mg total) by mouth 2 (two) times daily. 60 tablet 3  . aspirin EC 81 MG tablet Take 1 tablet (81 mg total) by mouth daily.    Marland Kitchen atorvastatin (LIPITOR) 80 MG tablet Take 1 tablet (80 mg total) by mouth daily. 30 tablet 6  . cilostazol (PLETAL) 100 MG tablet TAKE 1 TABLET TWICE DAILY. 180 tablet 3  . Coenzyme Q10 (CO Q 10 PO) Take 1 tablet by mouth daily.    Marland Kitchen erythromycin with ethanol (THERAMYCIN) 2 % external solution Apply 1 application topically daily as needed (for skin irritation around neck).    . ezetimibe (ZETIA) 10 MG tablet Take 10 mg by mouth daily.    Marland Kitchen FLUoxetine (PROZAC) 20 MG capsule Take 1 capsule (20 mg total) by mouth daily. 90 capsule 3  . furosemide (LASIX) 40 MG tablet Take 1.5 tablets (60 mg total) by mouth 2 (two) times daily. 90 tablet 0  . glucosamine-chondroitin 500-400 MG tablet Take 1 tablet by mouth 2 (two) times daily.     . Hypromellose (GENTEAL MILD) 0.2 % SOLN Place 1 drop into both eyes daily as needed (for dry eyes).     Marland Kitchen  Misc Natural Products (NF FORMULAS TESTOSTERONE) CAPS Take 1 capsule by mouth 2 (two) times daily.    . Multiple Vitamin (MULTIVITAMIN) tablet Take 1 tablet by mouth daily.    Marland Kitchen NIFEdipine (PROCARDIA-XL/ADALAT-CC/NIFEDICAL-XL) 30 MG 24 hr tablet Take 30 mg by mouth daily.    Marland Kitchen NITROSTAT 0.4 MG SL tablet DISSOLVE 1 TABLET UNDER TONGUE AS NEEDED FOR CHEST PAIN,MAY REPEAT IN5 MINUTES FOR 2 DOSES. 25 tablet 3  . omega-3 acid ethyl esters (LOVAZA) 1 G capsule Take 1 g by mouth 2 (two) times daily.    . polyethylene glycol powder (GLYCOLAX/MIRALAX) powder Take 17 g by mouth 2 (two) times daily. 850 g 0  . potassium chloride SA (K-DUR,KLOR-CON)  20 MEQ tablet Take 1 tablet (20 mEq total) by mouth daily. 30 tablet 0  . predniSONE (DELTASONE) 20 MG tablet Take 2 tablets (40 mg total) by mouth daily with breakfast. 4 tablet 0  . tamsulosin (FLOMAX) 0.4 MG CAPS capsule Take 0.4 mg by mouth daily after supper.     No current facility-administered medications for this visit.    Past Medical History  Diagnosis Date  . CAD (coronary artery disease)   . LVF (left ventricular failure)   . Carotid artery stenosis   . Hyperlipidemia   . Hypertension   . GERD (gastroesophageal reflux disease)   . IBS (irritable bowel syndrome)   . Arthritis   . Diabetes mellitus   . Thyroid disease   . Anemia   . Vitamin D deficiency   . Depression   . Fatty liver 2008  . Cancer     Melanoma - Back  . AAA (abdominal aortic aneurysm)   . Pneumonia Jan. 2014  . CHF (congestive heart failure)   . DVT (deep venous thrombosis)     Social History History  Substance Use Topics  . 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 Smokeless tobacco: Never Used     Comment: quit atleast 25 yrs ago, per pt  . Alcohol Use: 1.8 oz/week    3 Shots of liquor per week     Comment: socially    Family History Family History  Problem Relation Age of Onset  . Colon cancer Father   . Cancer Father   . Coronary artery disease Brother   . Diabetes Brother   . Cancer Brother     throat and abdominal  . Heart disease Brother   . Hyperlipidemia Brother   . Peripheral vascular disease Brother     Varicose Veins  . Diabetes Son   . Hyperlipidemia Son     Surgical History Past Surgical History  Procedure Laterality Date  . Total knee arthroplasty      bilateral- Pt fell 3 steps  . Cardiac stents  2005  . Coronary artery bypass graft  1992  . Joint replacement      Bilateral knee arthroplasty  . Eye surgery      Catarart  . Abdominal aortic aneurysm repair  2002  . Cardiac catheterization    . Left heart catheterization with coronary angiogram N/A 10/30/2014     Procedure: LEFT HEART CATHETERIZATION WITH CORONARY ANGIOGRAM;  Surgeon: Larey Dresser, MD;  Location[facility] CATH LAB;  Service: Cardiovascular;  Laterality: N/A;  . Uti  June 2016    While in [place]  2016    Allergies  Allergen Reactions  . Metoprolol Shortness Of Breath and Palpitations    Heart starts racing. Shallow breathing   . Metformin And Related Nausea And  Vomiting    Current Outpatient Prescriptions  Medication Sig Dispense Refill  . acetaminophen (TYLENOL) 500 MG tablet Take 1,000 mg by mouth 3 (three) times daily.    Marland Kitchen albuterol (PROVENTIL HFA;VENTOLIN HFA) 108 (90 BASE) MCG/ACT inhaler Inhale 2 puffs into the lungs every 4 (four) hours as needed for wheezing or shortness of breath. 1 Inhaler 0  . ALPRAZolam (XANAX) 0.5 MG tablet Take 0.5 tablets (0.25 mg total) by mouth 2 (two) times daily as needed for anxiety. 60 tablet 5  . amiodarone (PACERONE) 200 MG tablet Take 1 tablet (200 mg total) by mouth daily. 30 tablet 6  . apixaban (ELIQUIS) 2.5 MG TABS tablet Take 1 tablet (2.5 mg total) by mouth 2 (two) times daily. 60 tablet 3  . aspirin EC 81 MG tablet Take 1 tablet (81 mg total) by mouth daily.    Marland Kitchen atorvastatin (LIPITOR) 80 MG tablet Take 1 tablet (80 mg total) by mouth daily. 30 tablet 6  . cilostazol (PLETAL) 100 MG tablet TAKE 1 TABLET TWICE DAILY. 180 tablet 3  . Coenzyme Q10 (CO Q 10 PO) Take 1 tablet by mouth daily.    Marland Kitchen erythromycin with ethanol (THERAMYCIN) 2 % external solution Apply 1 application topically daily as needed (for skin irritation around neck).    . ezetimibe (ZETIA) 10 MG tablet Take 10 mg by mouth daily.    Marland Kitchen FLUoxetine (PROZAC) 20 MG capsule Take 1 capsule (20 mg total) by mouth daily. 90 capsule 3  . furosemide (LASIX) 40 MG tablet Take 1.5 tablets (60 mg total) by mouth 2 (two) times daily. 90 tablet 0  . glucosamine-chondroitin 500-400 MG tablet Take 1 tablet by mouth 2 (two) times daily.     . Hypromellose (GENTEAL MILD) 0.2 % SOLN Place 1 drop  into both eyes daily as needed (for dry eyes).     . Misc Natural Products (NF FORMULAS TESTOSTERONE) CAPS Take 1 capsule by mouth 2 (two) times daily.    . Multiple Vitamin (MULTIVITAMIN) tablet Take 1 tablet by mouth daily.    Marland Kitchen NIFEdipine (PROCARDIA-XL/ADALAT-CC/NIFEDICAL-XL) 30 MG 24 hr tablet Take 30 mg by mouth daily.    Marland Kitchen NITROSTAT 0.4 MG SL tablet DISSOLVE 1 TABLET UNDER TONGUE AS NEEDED FOR CHEST PAIN,MAY REPEAT IN5 MINUTES FOR 2 DOSES. 25 tablet 3  . omega-3 acid ethyl esters (LOVAZA) 1 G capsule Take 1 g by mouth 2 (two) times daily.    . polyethylene glycol powder (GLYCOLAX/MIRALAX) powder Take 17 g by mouth 2 (two) times daily. 850 g 0  . potassium chloride SA (K-DUR,KLOR-CON) 20 MEQ tablet Take 1 tablet (20 mEq total) by mouth daily. 30 tablet 0  . predniSONE (DELTASONE) 20 MG tablet Take 2 tablets (40 mg total) by mouth daily with breakfast. 4 tablet 0  . tamsulosin (FLOMAX) 0.4 MG CAPS capsule Take 0.4 mg by mouth daily after supper.     No current facility-administered medications for this visit.     REVIEW OF SYSTEMS: See HPI for pertinent positives and negatives.  Physical Examination Filed Vitals:   07/17/15 1138  BP: 117/61  Pulse: 84  Temp: 97.5 F (36.4 C)  TempSrc: Oral  Resp: 16  Height: 5\' 6"  (1.676 m)  Weight: 166 lb (75.297 kg)  SpO2: 100%   Body mass index is 26.81 kg/(m^2).  General: WDWN in NAD  Gait: slow, deliberate, using cane HENT: WNL  Eyes: Pupils equal  Pulmonary: normal non-labored breathing , without Rales, rhonchi, wheezing  Cardiac: RRR,  no detected Murmur.   Abdomen: soft, NT, moderate sized asymptomatic reducible ventral hernia with use of abdominal muscles. Skin: no rashes, no ulcers noted; no Gangrene , no cellulitis; no open wounds.  VASCULAR EXAM  Carotid Bruits  Left  Right    Negative  Negative    Radial pulses are 2+ palpable bilaterally Aorta is not palpable  VASCULAR EXAM:  Extremities without ischemic  changes  without Gangrene; without open wounds.   LE Pulses  LEFT  RIGHT   FEMORAL  palpable  palpable   POPLITEAL  not palpable  not palpable   POSTERIOR TIBIAL  not palpable  not palpable   DORSALIS PEDIS  ANTERIOR TIBIAL  Faintly palpable  2+palpable    Musculoskeletal: mild generalized muscle wasting and atrophy; no peripheral edema  Neurologic: A&O X 3; Appropriate Affect ;   MOTOR FUNCTION: 4/5 in upper extremities, 3/5 in lower extremities, Symmetric,  CN 2-12 intact except is somewhat hard of hearing. Speech is fluent/normal          Non-Invasive Vascular Imaging (07/17/2015):  CEREBROVASCULAR DUPLEX EVALUATION    INDICATION: Carotid artery disease    PREVIOUS INTERVENTION(S): None    DUPLEX EXAM: Carotid duplex    RIGHT  LEFT  Peak Systolic Velocities (cm/s) End Diastolic Velocities (cm/s) Plaque LOCATION Peak Systolic Velocities (cm/s) End Diastolic Velocities (cm/s) Plaque  80 11 HT CCA PROXIMAL 120 14 HT  80 9 HT CCA MID 80 11 HT  80 15 HT CCA DISTAL 87 12 HT  88 8  ECA 106 13 HT  167 33 CP ICA PROXIMAL 151 38 CP  99 20  ICA MID 110 19   93 23  ICA DISTAL 93 16     2.0 ICA / CCA Ratio (PSV) 1.8  Antegrade Vertebral Flow Antegrade  [id number] Brachial Systolic Pressure (mmHg) 123XX123  Triphasic Brachial Artery Waveforms Triphasic    Plaque Morphology:  HM = Homogeneous, HT = Heterogeneous, CP = Calcific Plaque, SP = Smooth Plaque, IP = Irregular Plaque  ADDITIONAL FINDINGS:     IMPRESSION: 1. Less than 40% bilateral internal carotid artery stenosis, calcific plaque may obscure higher velocity    Compared to the previous exam:  Unable to obtain higher velocity on the right      [facility] - ILIAC DUPLEX EVALUATION    INDICATION: PVD    PREVIOUS INTERVENTION(S): Abdominal aorta repair and left renal artery bypass graft 1997 by Dr. Amedeo Plenty    DUPLEX EXAM:      Peak Systolic Velocity (cm/s)  AORTA - Proximal 69  AORTA - Mid 83  AORTA -  Distal 39    RIGHT  LEFT  Peak Systolic Velocity (cm/s) Ratio (if abnormal) Waveform  Peak Systolic Velocity (cm/s) Ratio (if abnormal) Waveform  183  B Common Iliac Artery - Proximal 124  B  152  B Common Iliac Artery - Mid 106  B  109  B Common Iliac Artery - Distal 141  B  171  B External Iliac Artery - Proximal 194  B  159  B External Iliac Artery - Mid 125  B  159  B External Iliac Artery - Distal 68  B  -  B Internal Iliac Artery -  B  .86 Today's ABI / TBI 1.1  1.0 Previous ABI / TBI (  09/30/14) 1.0    Waveform:    M - Monophasic       B - Biphasic       T -  Triphasic  If Ankle Brachial Index (ABI) or Toe Brachial Index (TBI) performed, please see complete report     ADDITIONAL FINDINGS: Right CFA 76 cms biphasic, Left CFA 145 cms broad biphasic. Left renal artery bypass:  proximal 145 cms, distal 115 cms     IMPRESSION: 1. Less than 50% iliac stenosis 2. Left renal artery bypass appears patent    Compared to the previous exam:  No prior exam     ASSESSMENT:  Andrew [name] is a 81 y.o. male who is status post AAA repair and left renal artery bypass graft in 1997. He also has PAD and carotid artery stenosis. He has mild claudication with walking, no tissue loss in his lower extremities. He has no history of stroke or TIA. His blood pressure remains in control. Today's carotid Duplex suggests minimal bilateral ICA stenoses; this is improved in the right and stable in the left ICA. Today's bilateral aortoiliac Duplex suggests less than 50% bilateral iliac artery stenosis and a patent left renal artery bypass.   He is deconditioned from his two recent illnesses; he feels like he is slowly improving.   Face to face time with patient was 25 minutes. Over 50% of this time was spent on counseling and coordination of care.   PLAN:   Based on today's exam and non-invasive vascular lab results, the patient will follow up in 1 year with aortoiliac Duplex, ABI's, and carotid  Duplex.  I discussed in depth with the patient the nature of atherosclerosis, and emphasized the importance of maximal medical management including strict control of blood pressure, blood glucose, and lipid levels, obtaining regular exercise, and cessation of smoking.  The patient is aware that without maximal medical management the underlying atherosclerotic disease process will progress, limiting the benefit of any interventions.  The patient was given information about stroke prevention and what symptoms should prompt the patient to seek immediate medical care.  The patient was given information about PAD including signs, symptoms, treatment, what symptoms should prompt the patient to seek immediate medical care, and risk reduction measures to take. Thank you for allowing [place] to participate in this patient's care.  Clemon Chambers, RN, MSN, FNP-C [facility] Office: [phone]  Clinic MD: Bridgett Larsson  07/17/2015 11:45 AM

## 2015-07-19 ENCOUNTER — Other Ambulatory Visit: Payer: Self-pay | Admitting: Internal Medicine

## 2015-07-27 ENCOUNTER — Other Ambulatory Visit: Payer: Self-pay | Admitting: Cardiology

## 2015-07-27 ENCOUNTER — Telehealth: Payer: Self-pay | Admitting: Internal Medicine

## 2015-07-27 NOTE — Telephone Encounter (Signed)
Pt called in and said that he is having pain.  He wanted to know if Dr Ronnald Ramp could call in something for his pain.  Tried to setup an appt.  He just kept saying Dr Ronnald Ramp know what is going on with him.  Please advise?

## 2015-07-27 NOTE — Telephone Encounter (Signed)
When I last saw him he did not have any pain, where as his pain located today and what is the description of the pain?

## 2015-07-28 ENCOUNTER — Other Ambulatory Visit: Payer: Self-pay | Admitting: Internal Medicine

## 2015-07-28 DIAGNOSIS — N200 Calculus of kidney: Secondary | ICD-10-CM

## 2015-07-28 DIAGNOSIS — N281 Cyst of kidney, acquired: Secondary | ICD-10-CM

## 2015-07-28 MED ORDER — OXYCODONE-ACETAMINOPHEN 7.5-325 MG PO TABS: 1.0000 | ORAL_TABLET | ORAL | Status: DC | PRN

## 2015-07-28 NOTE — Telephone Encounter (Signed)
I wrote a prescription for Percocet. He will have to come pick up the prescription.

## 2015-07-28 NOTE — Telephone Encounter (Signed)
Can you also order something for pain until patient is seen?---please advise, thanks

## 2015-07-28 NOTE — Telephone Encounter (Signed)
Sounds like he is having another kidney stone, I have sent an urgent referral to urology

## 2015-07-28 NOTE — Telephone Encounter (Signed)
Pain is located in right upper and lower quadrant, sometimes radiates toward umbilicus---mostly constant mild pain with periods of sharp,stabbing pain that lasts for a few seconds---patient states sharp pains are almost unbearable---please advise, i will call him back, thanks

## 2015-07-28 NOTE — Telephone Encounter (Signed)
Advised patients wife that percocet rx is ready for pick up at front office--also that urgent referral has been sent to urology (verified with mary that referral has been placed)--and advised that if pain gets too bad to wait for urology appt, patient will need to go to ED--

## 2015-07-30 ENCOUNTER — Ambulatory Visit (HOSPITAL_COMMUNITY)
Admission: RE | Admit: 2015-07-30 | Discharge: 2015-07-30 | Disposition: A | Discharge status: Home / Self Care | Payer: Medicare PPO | Source: Ambulatory Visit | Attending: Cardiology | Admitting: Cardiology

## 2015-07-30 ENCOUNTER — Encounter (HOSPITAL_COMMUNITY): Payer: Self-pay | Admitting: Cardiology

## 2015-07-30 ENCOUNTER — Telehealth: Payer: Self-pay | Admitting: Internal Medicine

## 2015-07-30 VITALS — BP 136/52 | HR 84 | Wt 180.8 lb

## 2015-07-30 DIAGNOSIS — K219 Gastro-esophageal reflux disease without esophagitis: Secondary | ICD-10-CM | POA: Insufficient documentation

## 2015-07-30 DIAGNOSIS — I2510 Atherosclerotic heart disease of native coronary artery without angina pectoris: Secondary | ICD-10-CM | POA: Insufficient documentation

## 2015-07-30 DIAGNOSIS — I129 Hypertensive chronic kidney disease with stage 1 through stage 4 chronic kidney disease, or unspecified chronic kidney disease: Secondary | ICD-10-CM | POA: Diagnosis not present

## 2015-07-30 DIAGNOSIS — I6523 Occlusion and stenosis of bilateral carotid arteries: Secondary | ICD-10-CM | POA: Diagnosis not present

## 2015-07-30 DIAGNOSIS — N189 Chronic kidney disease, unspecified: Secondary | ICD-10-CM | POA: Diagnosis not present

## 2015-07-30 DIAGNOSIS — E785 Hyperlipidemia, unspecified: Secondary | ICD-10-CM | POA: Insufficient documentation

## 2015-07-30 DIAGNOSIS — I255 Ischemic cardiomyopathy: Secondary | ICD-10-CM | POA: Insufficient documentation

## 2015-07-30 DIAGNOSIS — Z7902 Long term (current) use of antithrombotics/antiplatelets: Secondary | ICD-10-CM | POA: Diagnosis not present

## 2015-07-30 DIAGNOSIS — Z79899 Other long term (current) drug therapy: Secondary | ICD-10-CM | POA: Insufficient documentation

## 2015-07-30 DIAGNOSIS — I4892 Unspecified atrial flutter: Secondary | ICD-10-CM | POA: Insufficient documentation

## 2015-07-30 DIAGNOSIS — Z95828 Presence of other vascular implants and grafts: Secondary | ICD-10-CM | POA: Insufficient documentation

## 2015-07-30 DIAGNOSIS — I4891 Unspecified atrial fibrillation: Secondary | ICD-10-CM | POA: Diagnosis not present

## 2015-07-30 DIAGNOSIS — Z7982 Long term (current) use of aspirin: Secondary | ICD-10-CM | POA: Diagnosis not present

## 2015-07-30 DIAGNOSIS — I5032 Chronic diastolic (congestive) heart failure: Secondary | ICD-10-CM | POA: Diagnosis not present

## 2015-07-30 DIAGNOSIS — I480 Paroxysmal atrial fibrillation: Secondary | ICD-10-CM

## 2015-07-30 DIAGNOSIS — Z87891 Personal history of nicotine dependence: Secondary | ICD-10-CM | POA: Diagnosis not present

## 2015-07-30 DIAGNOSIS — Z951 Presence of aortocoronary bypass graft: Secondary | ICD-10-CM | POA: Insufficient documentation

## 2015-07-30 DIAGNOSIS — E1122 Type 2 diabetes mellitus with diabetic chronic kidney disease: Secondary | ICD-10-CM | POA: Insufficient documentation

## 2015-07-30 DIAGNOSIS — N183 Chronic kidney disease, stage 3 unspecified: Secondary | ICD-10-CM

## 2015-07-30 DIAGNOSIS — Z85820 Personal history of malignant melanoma of skin: Secondary | ICD-10-CM | POA: Diagnosis not present

## 2015-07-30 LAB — CBC
HCT: 24.7 % — ABNORMAL LOW (ref 39.0–52.0)
HEMOGLOBIN: 8.2 g/dL — AB (ref 13.0–17.0)
MCH: 30.7 pg (ref 26.0–34.0)
MCHC: 33.2 g/dL (ref 30.0–36.0)
MCV: 92.5 fL (ref 78.0–100.0)
Platelets: 223 10*3/uL (ref 150–400)
RBC: 2.67 MIL/uL — ABNORMAL LOW (ref 4.22–5.81)
RDW: 14.7 % (ref 11.5–15.5)
WBC: 7.1 10*3/uL (ref 4.0–10.5)

## 2015-07-30 LAB — BRAIN NATRIURETIC PEPTIDE: B Natriuretic Peptide: 323.3 pg/mL — ABNORMAL HIGH (ref 0.0–100.0)

## 2015-07-30 MED ORDER — AMIODARONE HCL 200 MG PO TABS: 200.0000 mg | ORAL_TABLET | Freq: Two times a day (BID) | ORAL | Status: DC

## 2015-07-30 NOTE — Patient Instructions (Addendum)
INCREASE Amiodarone to 200 mg, one tab twice per day  Labs today  You have been referred to [facility]  Your physician has recommended that you have a Cardioversion (DCCV). Electrical Cardioversion uses a jolt of electricity to your heart either through paddles or wired patches attached to your chest. This is a controlled, usually prescheduled, procedure. Defibrillation is done under light anesthesia in the hospital, and you usually go home the day of the procedure. This is done to get your heart back into a normal rhythm. You are not awake for the procedure. Please see the instruction sheet given to you today.  Your physician has recommended that you have a pulmonary function test. Pulmonary Function Tests are a group of tests that measure how well air moves in and out of your lungs.   Your physician recommends that you schedule a follow-up appointment in: 4 weeks

## 2015-07-31 ENCOUNTER — Encounter (HOSPITAL_COMMUNITY): Payer: Self-pay | Admitting: Cardiology

## 2015-07-31 NOTE — Progress Notes (Signed)
Pt scheduled for DCCV with Dr.McLean on 08/11/2015 Cpt code 92960 With pts current insurance-[employer] No pre cert required

## 2015-08-01 NOTE — Progress Notes (Signed)
Patient ID: Andrew [name], male   DOB: [birth year], 81 y.o.   MRN: [id number] PCP: Dr. Ronnald Ramp  81 yo with history of CKD, CAD s/p CABG, and ischemic cardiomyopathy with primarily diastolic CHF presents for cardiology followup.  He has a history of PAD followed at [facility].  I took him for [facility] in 11/15. 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  He had had occlusion of a relatively small LAD, there were right to left collaterals and no intervention was done (medical management).    He and his wife went on a cruise along the [employer] in 5/16.  He admits to considerable dietary indiscretion (high sodium diet).  It appears that he developed a hypertensive crisis along with chest pain while on the ship. He went into atrial fibrillation and developed CHF.  He was taken to the hospital in [name], [place].  He was in the ICU for about a week on Bipap intermittently.  Troponin peaked at 0.09 during this admission.  He was diuresed and after about 2 wks left the hospital and was able to fly home.    He was admitted on 07/06/15 for RLQ pain and fever.  He was treated for UTI and had a kidney stone as well.  He developed SOB after IVF were given for AKI.  He was treated for acute COPD exacerbation as well as CHF decompensation.  He was diuresed with IV lasix.  His weight on admission was 179 and got as high as 188 after IVF. Discharge weight was 181.  He presents today for HF/ post hospital follow up. He was sent home from the hospital with lasix increased to 60 BID. We had increased it to 40 mg BID two visits ago. Weights at home stable around 175.  Has been as low as 166, without feeling dizzy or lightheadedness.  Breathing is greatly improved. Says it's been great since he's been out of the hospital. Thinks he would have to walk a few blocks quickly to get SOB.  Denies CP.  No BRBPR/melena.  Says he hasn't had any further claudication-like pain, but has not been very active.  No problem walking on flat grounds or  low incline hills. He had echo (6/16) showing EF 50-55%, mild LVH.  Cardiolite (6/16) showed EF 42%, prior anterolateral MI, no ischemia.  He cancelled his sleep study due to mask discomfort. Still having a lot of abd discomfort, in different places. Sees his PCP for that, waiting to see urologist.  Denies decreased energy level or palpitations but back in aflutter on ECG.  ECG: atrial flutter, old anterior MI   Labs (12/14): K 4.8, creatinine 1.5 Labs (5/15): LDL 92 Labs (11/15): K 4.1, creatinine 1.5 Labs (12/15): K 4, creatinine 1.5 Labs (1/16): LFTs normal, LDL 51, HDL 47 Labs (5/16): TnI 0.09 Labs (6/16): K 4.8 => 4.4, creatinine 1.79 => 2.26, HCT 28.7 Labs (7/16) K 4.2, creatinine 1.98, LFTs normal, TSH normal  PMH: 1. CKD 2. HTN 3. Hyperlipidemia 4. AAA: s/p resection with left renal artery bypass in 1997 5. GERD 6. IBS 7. Melanoma s/p reception 8. Diabetes: Diet-controlled 9. Carotid stenosis: Carotid dopplers (4/15) with 40-59% bilateral stenosis. Carotid dopplers (7/16) with < 40% BICA stenosis.  10. PAD: 9/14 ABIs 0.91 right 1.09 left.  7/16 ABIs 0.86 right, 1.1 left; aortoiliac duplex with < 50% bilateral iliac stenosis.  11. CAD: Has super-dominant RCA. s/p CABG 1992.  He had Taxus DES to mid PDA in 5/02. LHC (  1/05) with 90% ostial PDA (had DES to this vessel), totally occluded RIMA-PDA, totally occluded PLV, sequential SVG-PLV with 1 branch occluded.  LHC (11/15) with left main off the right cusp, 40-50% ostial left main, total occlusion of the proximal LAD (small vessel) with right to left collaterals, large super-dominant RCA with 50% ISR mid RCA, 50% ostial PDA, patent SVG-PLV, RIMA-PDA known atretic. Lexiscan Cardiolite (6/16) with EF 42%, prior anterolateral MI, no ischemia.  12. Ischemic cardiomyopathy: Echo (12/09) with EF 45-50%, basal to mid inferolateral hypokinesis. Echo (6/15) with EF 45-50%, akinesis of the mid to apical inferolateral wall.  Echo (6/16) with EF  50-55%, mild LVH, inferior hypokinesis, mild MR.  13. OA 14. Atrial fibrillation/flutter: Paroxysmal.  Noted during 5/16 hospitalization in [place] and in 6/16.  He developed atrial flutter in 8/16.   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  FH: Brother with CAD.   ROS: All systems reviewed and negative except as per HPI.   Current Outpatient Prescriptions  Medication Sig Dispense Refill  . acetaminophen (TYLENOL) 500 MG tablet Take 1,000 mg by mouth 3 (three) times daily.    Marland Kitchen albuterol (PROVENTIL HFA;VENTOLIN HFA) 108 (90 BASE) MCG/ACT inhaler Inhale 2 puffs into the lungs every 4 (four) hours as needed for wheezing or shortness of breath. 1 Inhaler 0  . ALPRAZolam (XANAX) 0.5 MG tablet Take 0.5 tablets (0.25 mg total) by mouth 2 (two) times daily as needed for anxiety. 60 tablet 5  . amiodarone (PACERONE) 200 MG tablet Take 1 tablet (200 mg total) by mouth 2 (two) times daily. 60 tablet 3  . apixaban (ELIQUIS) 2.5 MG TABS tablet Take 1 tablet (2.5 mg total) by mouth 2 (two) times daily. 60 tablet 3  . aspirin EC 81 MG tablet Take 1 tablet (81 mg total) by mouth daily.    Marland Kitchen atorvastatin (LIPITOR) 80 MG tablet TAKE 1 TABLET ONCE DAILY. 30 tablet 0  . cilostazol (PLETAL) 100 MG tablet TAKE 1 TABLET TWICE DAILY. 180 tablet 3  . Coenzyme Q10 (CO Q 10 PO) Take 1 tablet by mouth daily.    Marland Kitchen erythromycin with ethanol (THERAMYCIN) 2 % external solution Apply 1 application topically daily as needed (for skin irritation around neck).    Marland Kitchen FLUoxetine (PROZAC) 20 MG capsule Take 1 capsule (20 mg total) by mouth daily. 90 capsule 3  . furosemide (LASIX) 40 MG tablet Take 1.5 tablets (60 mg total) by mouth 2 (two) times daily. 90 tablet 0  . glucosamine-chondroitin 500-400 MG tablet Take 1 tablet by mouth 2 (two) times daily.     . Hypromellose (GENTEAL MILD) 0.2 % SOLN Place 1 drop into both eyes daily as needed (for dry eyes).     . Misc Natural  Products (NF FORMULAS TESTOSTERONE) CAPS Take 1 capsule by mouth 2 (two) times daily.    . Multiple Vitamin (MULTIVITAMIN) tablet Take 1 tablet by mouth daily.    Marland Kitchen NIFEdipine (PROCARDIA-XL/ADALAT-CC/NIFEDICAL-XL) 30 MG 24 hr tablet Take 30 mg by mouth daily.    Marland Kitchen NITROSTAT 0.4 MG SL tablet DISSOLVE 1 TABLET UNDER TONGUE AS NEEDED FOR CHEST PAIN,MAY REPEAT IN5 MINUTES FOR 2 DOSES. 25 tablet 3  . omega-3 acid ethyl esters (LOVAZA) 1 G capsule Take 1 g by mouth 2 (two) times daily.    Marland Kitchen oxyCODONE-acetaminophen (PERCOCET) 7.5-325 MG per tablet Take 1 tablet by mouth every 4 (four) hours as needed. 30 tablet 0  . polyethylene glycol powder (GLYCOLAX/MIRALAX)  powder Take 17 g by mouth 2 (two) times daily. 850 g 0  . potassium chloride SA (K-DUR,KLOR-CON) 20 MEQ tablet Take 1 tablet (20 mEq total) by mouth daily. 30 tablet 0  . tamsulosin (FLOMAX) 0.4 MG CAPS capsule Take 0.4 mg by mouth daily after supper.    Marland Kitchen ZETIA 10 MG tablet TAKE (1) TABLET DAILY. 90 tablet 3   No current facility-administered medications for this encounter.   BP 136/52 mmHg  Pulse 84  Wt 180 lb 12 oz (81.988 kg)  SpO2 92% General: NAD Neck: JVP 6-7, no thyromegaly or thyroid nodule.  Lungs: Clear to auscultation bilaterally with normal respiratory effort. CV: Nondisplaced PMI.  Heart irregular S1/S2, no S3/S4, 2/6 early SEM RUSB.  Trace ankle edema.  Right carotid bruit.  Difficult to palpate pedal pulses.  Abdomen: Soft, nontender, no hepatosplenomegaly, no distention.  Skin: Intact without lesions or rashes.  Neurologic: Alert and oriented x 3.  Psych: Normal affect. Extremities: No clubbing or cyanosis.   Assessment/Plan:  1. CAD: s/p CABG.  Cath in 11/15 showed occluded LAD with left to right collaterals.  The LAD was a relatively small vessel (super-dominant right).  Managed medically. He had chest pain in the setting of an admission to hospital in [place] with CHF exacerbation.  No chest pain since. Lexiscan  Cardiolite in 6/16 with infarction but no ischemia.  - Continue ASA 81.   - Continue statin.  2. Ischemic cardiomyopathy with primarily diastolic CHF: EF [id number] on last echo in 6/16.  NYHA class II symptoms currently, volume status looks ok. Became fluid overloaded during admission on 07/06/15 due to IVF but seems to be near-euvolemic on Lasix 60 mg bid. - Continue Lasix to 60 mg bid.  BMET today.  3. Carotid stenosis: Stable, followed at [facility].  4. PAD: Followed at [facility].  Stable ABIs recently.  Lots of pain when tried to come off cilostazol so he is continuing it.  5. Hyperlipidemia: Good lipids in 1/16.  6. CKD: BMET today.  Off losartan since last admission due to elevated creatinine.  7. Atrial fibrillation/flutter: He is in atrial flutter today.  UTI may have triggered this.  He has tolerated atrial arrhythmias poorly. - Increase amiodarone to 200 mg BID.  LFTs and TSH normal in July.  Will need to get regular eye exams.  - Continue Eliquis 2.5 bid.  - Schedule outpatient DCCV the week after next.  No TEE if stays on Eliquis. - Refer to EP => tolerates atrial arrhythmias poorly, ?flutter/fibrillation ablation.  8. Suspected OSA: He cancelled his sleep study due to mask discomfort 9. History of smoking/low oxygen saturation: O2 sat 92% today after walking in.  ?COPD component given prior smoking.  I will get PFTs.   Followup in 6 wks    Legrand Como "Jonni Sanger" Tillery PA-C 07/30/15  Patient seen with PA, agree with the above note.  Volume stable.  Back in atrial flutter, tolerates atrial arrhythmias poorly.  Will plan DCCV the week after next (has been on Eliquis without missing doses) and will increase amiodarone to 200 mg bid prior.  I will refer him to EP to assess for candidacy for atrial flutter/fibrillation ablation.   Loralie Champagne 08/01/2015

## 2015-08-04 ENCOUNTER — Other Ambulatory Visit: Payer: Self-pay | Admitting: *Deleted

## 2015-08-04 ENCOUNTER — Other Ambulatory Visit: Payer: Self-pay | Admitting: Internal Medicine

## 2015-08-05 DIAGNOSIS — Z87440 Personal history of urinary (tract) infections: Secondary | ICD-10-CM | POA: Diagnosis not present

## 2015-08-05 DIAGNOSIS — N281 Cyst of kidney, acquired: Secondary | ICD-10-CM | POA: Diagnosis not present

## 2015-08-07 ENCOUNTER — Other Ambulatory Visit: Payer: Self-pay | Admitting: Urology

## 2015-08-07 ENCOUNTER — Ambulatory Visit (HOSPITAL_COMMUNITY)
Admission: RE | Admit: 2015-08-07 | Discharge: 2015-08-07 | Disposition: A | Discharge status: Home / Self Care | Payer: Medicare PPO | Source: Ambulatory Visit | Attending: Cardiology | Admitting: Cardiology

## 2015-08-07 ENCOUNTER — Telehealth (HOSPITAL_COMMUNITY): Payer: Self-pay | Admitting: *Deleted

## 2015-08-07 DIAGNOSIS — N281 Cyst of kidney, acquired: Secondary | ICD-10-CM

## 2015-08-07 DIAGNOSIS — I5032 Chronic diastolic (congestive) heart failure: Secondary | ICD-10-CM

## 2015-08-07 LAB — PULMONARY FUNCTION TEST
DL/VA % pred: 65 %
DL/VA: 2.81 ml/min/mmHg/L
DLCO COR % PRED: 40 %
DLCO COR: 11.05 ml/min/mmHg
DLCO UNC: 8.36 ml/min/mmHg
DLCO unc % pred: 31 %
FEF 25-75 PRE: 0.37 L/s
FEF 25-75 Post: 0.93 L/sec
FEF2575-%Change-Post: 150 %
FEF2575-%PRED-PRE: 27 %
FEF2575-%Pred-Post: 67 %
FEV1-%Change-Post: 16 %
FEV1-%Pred-Post: 86 %
FEV1-%Pred-Pre: 74 %
FEV1-Post: 1.88 L
FEV1-Pre: 1.62 L
FEV1FVC-%Change-Post: 22 %
FEV1FVC-%Pred-Pre: 81 %
FEV6-%Change-Post: 10 %
FEV6-%PRED-POST: 89 %
FEV6-%PRED-PRE: 80 %
FEV6-PRE: 2.35 L
FEV6-Post: 2.59 L
FEV6FVC-%Change-Post: 16 %
FEV6FVC-%PRED-POST: 106 %
FEV6FVC-%Pred-Pre: 91 %
FVC-%Change-Post: -4 %
FVC-%Pred-Post: 84 %
FVC-%Pred-Pre: 88 %
FVC-POST: 2.66 L
FVC-PRE: 2.79 L
POST FEV6/FVC RATIO: 98 %
Post FEV1/FVC ratio: 71 %
Pre FEV1/FVC ratio: 58 %
Pre FEV6/FVC Ratio: 84 %
RV % pred: 104 %
RV: 2.67 L
TLC % PRED: 83 %
TLC: 5.25 L

## 2015-08-07 MED ORDER — ALBUTEROL SULFATE (2.5 MG/3ML) 0.083% IN NEBU
2.5000 mg | INHALATION_SOLUTION | Freq: Once | RESPIRATORY_TRACT | Status: AC
  Administered 2015-08-07: 2.5 mg via RESPIRATORY_TRACT

## 2015-08-07 NOTE — Telephone Encounter (Signed)
Received call from [facility], pt is scheduled to have a peripelvic cyst removed on 8/18 and they need clearance for him to be off all blood thinning agents: 1-ASA 5 days prior 2-Pletal 4 days prior 3-Eliquis 2 days prior   They would like note of clearance faxed to Dr Jeffie Pollock at [phone]  Will send to Dr [name] for review/approval

## 2015-08-09 NOTE — Telephone Encounter (Signed)
This would be ok.  We will need to reschedule his DCCV, which was to be this week, for 1 month after he has restarted Eliquis.

## 2015-08-11 ENCOUNTER — Ambulatory Visit (HOSPITAL_COMMUNITY): Admission: RE | Admit: 2015-08-11 | Payer: Medicare PPO | Source: Ambulatory Visit | Admitting: Cardiology

## 2015-08-11 ENCOUNTER — Ambulatory Visit (HOSPITAL_BASED_OUTPATIENT_CLINIC_OR_DEPARTMENT_OTHER): Payer: Medicare PPO

## 2015-08-11 ENCOUNTER — Encounter (HOSPITAL_COMMUNITY): Admission: RE | Payer: Self-pay | Source: Ambulatory Visit

## 2015-08-11 SURGERY — CARDIOVERSION
Anesthesia: Monitor Anesthesia Care

## 2015-08-11 NOTE — Telephone Encounter (Signed)
Note faxed.

## 2015-08-12 ENCOUNTER — Other Ambulatory Visit: Payer: Self-pay | Admitting: Radiology

## 2015-08-12 ENCOUNTER — Ambulatory Visit: Payer: Medicare PPO | Admitting: Internal Medicine

## 2015-08-12 DIAGNOSIS — I5032 Chronic diastolic (congestive) heart failure: Secondary | ICD-10-CM | POA: Diagnosis not present

## 2015-08-13 ENCOUNTER — Ambulatory Visit (HOSPITAL_COMMUNITY)
Admission: RE | Admit: 2015-08-13 | Discharge: 2015-08-13 | Disposition: A | Discharge status: Home / Self Care | Payer: Medicare PPO | Source: Ambulatory Visit | Attending: Urology | Admitting: Urology

## 2015-08-13 ENCOUNTER — Encounter (HOSPITAL_COMMUNITY): Payer: Medicare PPO

## 2015-08-13 ENCOUNTER — Encounter (HOSPITAL_COMMUNITY): Payer: Self-pay

## 2015-08-13 DIAGNOSIS — Z79899 Other long term (current) drug therapy: Secondary | ICD-10-CM | POA: Insufficient documentation

## 2015-08-13 DIAGNOSIS — N281 Cyst of kidney, acquired: Secondary | ICD-10-CM | POA: Diagnosis not present

## 2015-08-13 LAB — PROTIME-INR
INR: 1.19 (ref 0.00–1.49)
Prothrombin Time: 15.3 seconds — ABNORMAL HIGH (ref 11.6–15.2)

## 2015-08-13 LAB — CBC
HEMATOCRIT: 27.1 % — AB (ref 39.0–52.0)
HEMOGLOBIN: 8.5 g/dL — AB (ref 13.0–17.0)
MCH: 29 pg (ref 26.0–34.0)
MCHC: 31.4 g/dL (ref 30.0–36.0)
MCV: 92.5 fL (ref 78.0–100.0)
Platelets: 246 10*3/uL (ref 150–400)
RBC: 2.93 MIL/uL — AB (ref 4.22–5.81)
RDW: 14.5 % (ref 11.5–15.5)
WBC: 7.1 10*3/uL (ref 4.0–10.5)

## 2015-08-13 LAB — APTT: APTT: 39 s — AB (ref 24–37)

## 2015-08-13 MED ORDER — MIDAZOLAM HCL 2 MG/2ML IJ SOLN
INTRAMUSCULAR | Status: AC
  Filled 2015-08-13: qty 6

## 2015-08-13 MED ORDER — FENTANYL CITRATE (PF) 100 MCG/2ML IJ SOLN
INTRAMUSCULAR | Status: DC
Start: 2015-08-13 — End: 2015-08-13
  Filled 2015-08-13: qty 4

## 2015-08-13 MED ORDER — SODIUM CHLORIDE 0.9 % IV SOLN
Freq: Once | INTRAVENOUS | Status: AC
  Administered 2015-08-13: 12:00:00 via INTRAVENOUS

## 2015-08-13 NOTE — Progress Notes (Signed)
Procedure rescheduled for next week.  Due to medication

## 2015-08-13 NOTE — Progress Notes (Signed)
Pre procedure instructions given by Abigail Butts PA. Procedure card with time and date given to patient. Instructed to cll # on card if any questions.

## 2015-08-18 ENCOUNTER — Other Ambulatory Visit: Payer: Self-pay | Admitting: Radiology

## 2015-08-19 ENCOUNTER — Ambulatory Visit (HOSPITAL_COMMUNITY)
Admission: RE | Admit: 2015-08-19 | Discharge: 2015-08-19 | Disposition: A | Discharge status: Home / Self Care | Payer: Medicare PPO | Source: Ambulatory Visit | Attending: Urology | Admitting: Urology

## 2015-08-19 ENCOUNTER — Encounter (HOSPITAL_COMMUNITY): Payer: Self-pay

## 2015-08-19 DIAGNOSIS — N281 Cyst of kidney, acquired: Secondary | ICD-10-CM | POA: Insufficient documentation

## 2015-08-19 DIAGNOSIS — Z86718 Personal history of other venous thrombosis and embolism: Secondary | ICD-10-CM | POA: Diagnosis not present

## 2015-08-19 DIAGNOSIS — R109 Unspecified abdominal pain: Secondary | ICD-10-CM | POA: Diagnosis not present

## 2015-08-19 DIAGNOSIS — E785 Hyperlipidemia, unspecified: Secondary | ICD-10-CM | POA: Insufficient documentation

## 2015-08-19 DIAGNOSIS — Z7902 Long term (current) use of antithrombotics/antiplatelets: Secondary | ICD-10-CM | POA: Insufficient documentation

## 2015-08-19 DIAGNOSIS — Z833 Family history of diabetes mellitus: Secondary | ICD-10-CM | POA: Diagnosis not present

## 2015-08-19 DIAGNOSIS — E119 Type 2 diabetes mellitus without complications: Secondary | ICD-10-CM | POA: Insufficient documentation

## 2015-08-19 DIAGNOSIS — Z87891 Personal history of nicotine dependence: Secondary | ICD-10-CM | POA: Insufficient documentation

## 2015-08-19 DIAGNOSIS — Z79899 Other long term (current) drug therapy: Secondary | ICD-10-CM | POA: Insufficient documentation

## 2015-08-19 DIAGNOSIS — Z7982 Long term (current) use of aspirin: Secondary | ICD-10-CM | POA: Insufficient documentation

## 2015-08-19 DIAGNOSIS — Z8249 Family history of ischemic heart disease and other diseases of the circulatory system: Secondary | ICD-10-CM | POA: Insufficient documentation

## 2015-08-19 DIAGNOSIS — I10 Essential (primary) hypertension: Secondary | ICD-10-CM | POA: Insufficient documentation

## 2015-08-19 DIAGNOSIS — E079 Disorder of thyroid, unspecified: Secondary | ICD-10-CM | POA: Diagnosis not present

## 2015-08-19 DIAGNOSIS — K219 Gastro-esophageal reflux disease without esophagitis: Secondary | ICD-10-CM | POA: Insufficient documentation

## 2015-08-19 DIAGNOSIS — I2510 Atherosclerotic heart disease of native coronary artery without angina pectoris: Secondary | ICD-10-CM | POA: Diagnosis not present

## 2015-08-19 LAB — CBC
HCT: 25.1 % — ABNORMAL LOW (ref 39.0–52.0)
HEMOGLOBIN: 8.2 g/dL — AB (ref 13.0–17.0)
MCH: 30.5 pg (ref 26.0–34.0)
MCHC: 32.7 g/dL (ref 30.0–36.0)
MCV: 93.3 fL (ref 78.0–100.0)
Platelets: 233 10*3/uL (ref 150–400)
RBC: 2.69 MIL/uL — AB (ref 4.22–5.81)
RDW: 15 % (ref 11.5–15.5)
WBC: 7.2 10*3/uL (ref 4.0–10.5)

## 2015-08-19 LAB — APTT: APTT: 39 s — AB (ref 24–37)

## 2015-08-19 LAB — PROTIME-INR
INR: 1.27 (ref 0.00–1.49)
PROTHROMBIN TIME: 16.1 s — AB (ref 11.6–15.2)

## 2015-08-19 MED ORDER — MIDAZOLAM HCL 2 MG/2ML IJ SOLN
INTRAMUSCULAR | Status: AC
  Filled 2015-08-19: qty 2

## 2015-08-19 MED ORDER — FENTANYL CITRATE (PF) 100 MCG/2ML IJ SOLN
INTRAMUSCULAR | Status: AC | PRN
  Administered 2015-08-19: 25 ug via INTRAVENOUS

## 2015-08-19 MED ORDER — FENTANYL CITRATE (PF) 100 MCG/2ML IJ SOLN
INTRAMUSCULAR | Status: DC
Start: 2015-08-19 — End: 2015-08-20
  Filled 2015-08-19: qty 2

## 2015-08-19 MED ORDER — MIDAZOLAM HCL 2 MG/2ML IJ SOLN
INTRAMUSCULAR | Status: AC | PRN
  Administered 2015-08-19: 0.5 mg via INTRAVENOUS

## 2015-08-19 MED ORDER — LIDOCAINE-EPINEPHRINE 2 %-1:100000 IJ SOLN
INTRAMUSCULAR | Status: AC
  Filled 2015-08-19: qty 1

## 2015-08-19 MED ORDER — SODIUM CHLORIDE 0.9 % IV SOLN
Freq: Once | INTRAVENOUS | Status: AC
  Administered 2015-08-19: 12:00:00 via INTRAVENOUS

## 2015-08-19 NOTE — H&P (Signed)
Chief Complaint: Patient was seen in consultation today for abdominal pain- parapelvic lymphatic cyst at the request of Andrew [name]  Referring Physician(s): Andrew [name]  History of Present Illness: Andrew [name] is a 81 y.o. male   Pt developed abdominal pain 4 weeks ago Was treated for UTI Continues to be some painful Noted parapelvic lymphatic cyst on work up CT 07/12/15  Bilateral renal cysts including a large right-sided parapelvic cyst measuring 9 cm. Cysts are of varying density and are incompletely characterized without IV contrast material.  Request for aspiration of cyst per Dr Jeffie Pollock Now scheduled for same LD Eliquis Sunday LD Pletal 1 week ago  Past Medical History  Diagnosis Date  . CAD (coronary artery disease)   . LVF (left ventricular failure)   . Carotid artery stenosis   . Hyperlipidemia   . Hypertension   . GERD (gastroesophageal reflux disease)   . IBS (irritable bowel syndrome)   . Arthritis   . Diabetes mellitus   . Thyroid disease   . Anemia   . Vitamin D deficiency   . Depression   . Fatty liver 2008  . Cancer     Melanoma - Back  . AAA (abdominal aortic aneurysm)   . Pneumonia Jan. 2014  . CHF (congestive heart failure)   . DVT (deep venous thrombosis)     Past Surgical History  Procedure Laterality Date  . Total knee arthroplasty      bilateral- Pt fell 3 steps  . Cardiac stents  2005  . Coronary artery bypass graft  1992  . Joint replacement      Bilateral knee arthroplasty  . Eye surgery      Catarart  . Abdominal aortic aneurysm repair  2002  . Cardiac catheterization    . Left heart catheterization with coronary angiogram N/A 10/30/2014    Procedure: LEFT HEART CATHETERIZATION WITH CORONARY ANGIOGRAM;  Surgeon: Larey Dresser, MD;  Location: [facility] CATH LAB;  Service: Cardiovascular;  Laterality: N/A;  . Uti  June 2016    While in [place]  2016    Allergies: Metoprolol and Metformin and related  Medications: Prior to  Admission medications   Medication Sig Start Date End Date Taking? Authorizing Provider  acetaminophen (TYLENOL) 500 MG tablet Take 1,000 mg by mouth 3 (three) times daily.   Yes Historical Provider, MD  ALPRAZolam Duanne Moron) 0.5 MG tablet Take 0.5 tablets (0.25 mg total) by mouth 2 (two) times daily as needed for anxiety. 04/16/15  Yes Janith Lima, MD  Coenzyme Q10 (CO Q 10 PO) Take 1 tablet by mouth daily.   Yes Historical Provider, MD  FLUoxetine (PROZAC) 20 MG capsule Take 1 capsule (20 mg total) by mouth daily. 03/05/15  Yes Janith Lima, MD  furosemide (LASIX) 40 MG tablet Take 1.5 tablets (60 mg total) by mouth 2 (two) times daily. 07/12/15  Yes Janece Canterbury, MD  glucosamine-chondroitin 500-400 MG tablet Take 1 tablet by mouth 2 (two) times daily.    Yes Historical Provider, MD  Multiple Vitamin (MULTIVITAMIN) tablet Take 1 tablet by mouth daily.   Yes Historical Provider, MD  NIFEdipine (PROCARDIA-XL/ADALAT CC) 30 MG 24 hr tablet TAKE 1 TABLET ONCE DAILY. 08/04/15  Yes Janith Lima, MD  omega-3 acid ethyl esters (LOVAZA) 1 G capsule Take 1 g by mouth 2 (two) times daily.   Yes Historical Provider, MD  potassium chloride SA (K-DUR,KLOR-CON) 20 MEQ tablet Take 1 tablet (20 mEq total) by mouth daily. 07/12/15  Yes Janece Canterbury, MD  tamsulosin (FLOMAX) 0.4 MG CAPS capsule TAKE (1) CAPSULE DAILY. 08/04/15  Yes Janith Lima, MD  temazepam (RESTORIL) 30 MG capsule Take 30 mg by mouth at bedtime as needed for sleep.   Yes Historical Provider, MD  ZETIA 10 MG tablet TAKE (1) TABLET DAILY. 07/19/15  Yes Janith Lima, MD  albuterol (PROVENTIL HFA;VENTOLIN HFA) 108 (90 BASE) MCG/ACT inhaler Inhale 2 puffs into the lungs every 4 (four) hours as needed for wheezing or shortness of breath. 07/12/15   Janece Canterbury, MD  amiodarone (PACERONE) 200 MG tablet Take 1 tablet (200 mg total) by mouth 2 (two) times daily. 07/30/15   Larey Dresser, MD  apixaban (ELIQUIS) 2.5 MG TABS tablet Take 1 tablet (2.5 mg  total) by mouth 2 (two) times daily. 05/27/15   Larey Dresser, MD  aspirin EC 81 MG tablet Take 1 tablet (81 mg total) by mouth daily. 05/22/14   Larey Dresser, MD  atorvastatin (LIPITOR) 80 MG tablet TAKE 1 TABLET ONCE DAILY. 07/27/15   Larey Dresser, MD  cilostazol (PLETAL) 100 MG tablet TAKE 1 TABLET TWICE DAILY. 06/22/15   Janith Lima, MD  erythromycin with ethanol [facility]) 2 % external solution Apply 1 application topically daily as needed (for skin irritation around neck).    Historical Provider, MD  Hypromellose (GENTEAL MILD) 0.2 % SOLN Place 1 drop into both eyes daily as needed (for dry eyes).     Historical Provider, MD  losartan (COZAAR) 100 MG tablet Take 100 mg by mouth daily.    Historical Provider, MD  Misc Natural Products (NF FORMULAS TESTOSTERONE) CAPS Take 1 capsule by mouth 2 (two) times daily.    Historical Provider, MD  NIFEdipine (PROCARDIA-XL/ADALAT-CC/NIFEDICAL-XL) 30 MG 24 hr tablet Take 30 mg by mouth daily.    Historical Provider, MD  NITROSTAT 0.4 MG SL tablet DISSOLVE 1 TABLET UNDER TONGUE AS NEEDED FOR CHEST PAIN,MAY REPEAT IN5 MINUTES FOR 2 DOSES. 07/13/15   Janith Lima, MD  oxyCODONE-acetaminophen (PERCOCET) 7.5-325 MG per tablet Take 1 tablet by mouth every 4 (four) hours as needed. Patient taking differently: Take 1 tablet by mouth every 4 (four) hours as needed (pain).  07/28/15   Janith Lima, MD  polyethylene glycol powder (GLYCOLAX/MIRALAX) powder Take 17 g by mouth 2 (two) times daily. 07/12/15   Janece Canterbury, MD     Family History  Problem Relation Age of Onset  . Colon cancer Father   . Cancer Father   . Coronary artery disease Brother   . Diabetes Brother   . Cancer Brother     throat and abdominal  . Heart disease Brother   . Hyperlipidemia Brother   . Peripheral vascular disease Brother     Varicose Veins  . Diabetes Son   . Hyperlipidemia Son     Social History   Social History  . Marital Status: Married    Spouse Name: N/A    . Number of Children: N/A  . Years of Education: N/A   Occupational History  . Retired    Social History Main Topics  . 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 Smokeless tobacco: Never Used     Comment: quit atleast 25 yrs ago, per pt  . Alcohol Use: 1.8 oz/week    3 Shots of liquor per week     Comment: socially  . Drug Use: No  . Sexual Activity: Yes   Other Topics Concern  . None  Social History Narrative    Review of Systems: A 12 point ROS discussed and pertinent positives are indicated in the HPI above.  All other systems are negative.  Review of Systems  Constitutional: Positive for activity change. Negative for fever and fatigue.  Respiratory: Negative for shortness of breath.   Gastrointestinal: Positive for abdominal pain.  Genitourinary: Negative for difficulty urinating.  Musculoskeletal: Positive for back pain.  Neurological: Negative for weakness.  Psychiatric/Behavioral: Negative for behavioral problems and confusion.    Vital Signs: BP 149/65 mmHg  Pulse 70  Temp(Src) 97.6 F (36.4 C) (Oral)  Resp 18  SpO2 98%  Physical Exam  Constitutional: He is oriented to person, place, and time.  Cardiovascular: Normal rate.   No murmur heard. Irreg rate  Pulmonary/Chest: Effort normal and breath sounds normal. He has no wheezes.  Abdominal: Soft. Bowel sounds are normal. There is no tenderness.  Musculoskeletal: Normal range of motion.  Neurological: He is alert and oriented to person, place, and time.  Skin: Skin is warm and dry.  Psychiatric: He has a normal mood and affect. His behavior is normal. Judgment and thought content normal.  Nursing note and vitals reviewed.   Mallampati Score:  MD Evaluation Airway: WNL Heart: WNL Abdomen: WNL Chest/ Lungs: WNL ASA  Classification: 3 Mallampati/Airway Score: One  Imaging: No results found.  Labs:  CBC:  Recent Labs  07/15/15 1149 07/30/15 1030 08/13/15 1125 08/19/15 1111  WBC 14.6* 7.1 7.1  7.2  HGB 10.0* 8.2* 8.5* 8.2*  HCT 29.9* 24.7* 27.1* 25.1*  PLT 524.0* 223 246 233    COAGS:  Recent Labs  10/28/14 1516 08/13/15 1125 08/19/15 1111  INR 1.0 1.19 1.27  APTT 27.8 39* 39*    BMP:  Recent Labs  07/09/15 0405 07/10/15 0350 07/11/15 0530 07/12/15 0418 07/15/15 1149  NA 138 136 138 137 140  K 4.4 4.3 3.4* 3.5 4.2  CL 103 102 101 98* 94*  CO2 25 26 28 29  38*  GLUCOSE 160* 183* 154* 295* 131*  BUN 66* 65* 60* 63* 58*  CALCIUM 9.5 9.6 9.1 8.6* 10.1  CREATININE 2.01* 1.85* 1.73* 1.85* 1.98*  GFRNONAA 29* 32* 34* 32*  --   GFRAA 33* 37* 40* 37*  --     LIVER FUNCTION TESTS:  Recent Labs  03/23/15 1504 06/30/15 1630 07/06/15 0855 07/07/15 0408  BILITOT 0.5 0.6 0.7 0.6  AST 24 29 28  40  ALT 22 24 27  33  ALKPHOS 19* 25* 31* 28*  PROT 6.8 7.0 7.4 7.0  ALBUMIN 4.0 3.8 3.4* 3.0*    TUMOR MARKERS: No results for input(s): AFPTM, CEA, CA199, CHROMGRNA in the last 8760 hours.  Assessment and Plan:  Parapelvic cyst Increasing abd pain Now scheduled for cyst aspiration in IR Risks and Benefits discussed with the patient including, but not limited to bleeding, infection, damage to adjacent structures or low yield requiring additional tests. All of the patient's questions were answered, patient is agreeable to proceed. Consent signed and in chart.   Thank you for this interesting consult.  I greatly enjoyed meeting Dontaye Hrabik Milbrath and look forward to participating in their care.  A copy of this report was sent to the requesting provider on this date.  Signed: Ahmaya Ostermiller A 08/19/2015, 12:57 PM   I spent a total of  30 Minutes   in face to face in clinical consultation, greater than 50% of which was counseling/coordinating care for parapelvic cyst aspiration

## 2015-08-19 NOTE — Procedures (Signed)
Technically successful US guided R parapelvic renal cyst aspiration yielding approximately 320 cc of tan colored cloudy fluid. No immediate post procedural complications.

## 2015-08-19 NOTE — Discharge Instructions (Signed)
Incision Care  An incision is a surgical cut to open your skin. You need to take care of your incision. This helps you to not get an infection. HOME CARE  Only take medicine as told by your doctor.  Do not take off your bandage (dressing) or get your incision wet until your doctor approves. Change the bandage and call your doctor if the bandage gets wet, dirty, or starts to smell.  Take showers. Do not take baths, swim, or do anything that may soak your incision until it heals.  Return to your normal diet and activities as told or allowed by your doctor.  Avoid lifting any weight until your doctor approves.  Put medicine that helps lessen itching on your incision as told by your doctor. Do not pick or scratch at your incision.  Keep your doctor visit to have your stitches (sutures) or staples removed.  Drink enough fluids to keep your pee (urine) clear or pale yellow. GET HELP RIGHT AWAY IF:  You have a fever.  You have a rash.  You are dizzy, or you pass out (faint) while standing.  You have trouble breathing.  You have a reaction or side effects to medicine given to you.  You have redness, puffiness (swelling), or more pain in the incision and medicine does not help.  You have fluid, blood, or yellowish-white fluid (pus) coming from the incision lasting over 1 day.  You have muscle aches, chills, or you feel sick.  You have a bad smell coming from the incision or bandage.  Your incision opens up after stitches, staples, or sticky strips have been removed.  You keep feeling sick to your stomach (nauseous) or keep throwing up (vomiting). MAKE SURE YOU:   Understand these instructions.  Will watch your condition.  Will get help right away if you are not doing well or get worse. Document Released: 03/05/2012 Document Reviewed: 02/05/2014 [facility] Patient Information 2015 [place]. This information is not intended to replace advice given to you by your health  care provider. Make sure you discuss any questions you have with your health care provider.  Fine Needle Aspiration Fine needle aspiration is a procedure used to remove a piece of tissue. The tissue may be removed from a swelling or abnormal growth (tumor). It is also used to confirm a cyst. A cyst is a fluid filled sac. The procedure may be done by your caregiver or a specialist. LET YOUR CAREGIVER KNOW ABOUT:   Any medications you take, especially blood thinners like aspirin.  Any problems you may have had with similar procedures in the past. RISKS AND COMPLICATIONS This is a safe procedure. There is a very small risk of infection and or bleeding. Other complications can occur if the aspiration site is deep in the body. Your caregiver or specialist will explain this to you.  BEFORE THE PROCEDURE   No special preparation is needed in most cases. Your caregiver will let you know if there are special requirements, such as an empty stomach.  Be sure to ask your caregiver any questions before the procedure starts. PROCEDURE   This procedure is done under local or no anesthetic.  The skin is cleaned carefully.  A thin needle is directed into a lump. The needle is directed in several different directions into the lump while suction is applied to the needle. When samples are removed, the needle is withdrawn.  The contents obtained are placed on a slide. They are then fixed, stained and  examined under the microscope. The slide is examined by a specialist in the examination of tissue (pathologist).  A diagnosis can then be made. The pathologist will decide if the specimen is cancerous (malignant) or not cancerous (benign). If fluid is taken from a cyst, cells from the fluid can be examined. If no material is obtained from a fine needle aspiration, the sample may not rule out a problem. Sometimes the procedure is done again. The pathologist may need several days before a result is available. AFTER  THE PROCEDURE   There are usually no limits on diet or activity after a fine needle aspiration.  Your caregiver may give you instructions regarding the aspiration site. This will include information about keeping the site clean and dry. Follow these instructions carefully.  Call for your test results as instructed by your caregiver. Remember, it is your responsibility to get the results of your testing. Do not think everything is fine if you have not heard from your caregiver.  Keep a close watch on the aspiration site. Report any redness, swelling or drainage.  You should not have much pain at the aspiration site.  Take any medications as told by your caregiver. SEEK MEDICAL CARE IF:   You have pain or drainage at the aspiration site that does not go away.  Swelling at the aspiration site does not gradually go away. SEEK IMMEDIATE MEDICAL CARE IF:   You develop a fever a day or two after the aspiration.  You develop severe pain at the aspiration site.  You develop a warm, tender swelling at the aspiration site. Document Released: 12/09/2000 Document Revised: 03/05/2012 Document Reviewed: 08/22/2008 [facility] Patient Information 2015 [place], [place]. This information is not intended to replace advice given to you by your health care provider. Make sure you discuss any questions you have with your health care provider. Conscious Sedation, Adult, Care After Refer to this sheet in the next few weeks. These instructions provide you with information on caring for yourself after your procedure. Your health care provider may also give you more specific instructions. Your treatment has been planned according to current medical practices, but problems sometimes occur. Call your health care provider if you have any problems or questions after your procedure. WHAT TO EXPECT AFTER THE PROCEDURE  After your procedure:  You may feel sleepy, clumsy, and have poor balance for several  hours.  Vomiting may occur if you eat too soon after the procedure. HOME CARE INSTRUCTIONS  Do not participate in any activities where you could become injured for at least 24 hours. Do not:  Drive.  Swim.  Ride a bicycle.  Operate heavy machinery.  Cook.  Use power tools.  Climb ladders.  Work from a high place.  Do not make important decisions or sign legal documents until you are improved.  If you vomit, drink water, juice, or soup when you can drink without vomiting. Make sure you have little or no nausea before eating solid foods.  Only take over-the-counter or prescription medicines for pain, discomfort, or fever as directed by your health care provider.  Make sure you and your family fully understand everything about the medicines given to you, including what side effects may occur.  You should not drink alcohol, take sleeping pills, or take medicines that cause drowsiness for at least 24 hours.  If you smoke, do not smoke without supervision.  If you are feeling better, you may resume normal activities 24 hours after you were sedated.  Keep  all appointments with your health care provider. SEEK MEDICAL CARE IF:  Your skin is pale or bluish in color.  You continue to feel nauseous or vomit.  Your pain is getting worse and is not helped by medicine.  You have bleeding or swelling.  You are still sleepy or feeling clumsy after 24 hours. SEEK IMMEDIATE MEDICAL CARE IF:  You develop a rash.  You have difficulty breathing.  You develop any type of allergic problem.  You have a fever. MAKE SURE YOU:  Understand these instructions.  Will watch your condition.  Will get help right away if you are not doing well or get worse. Document Released: 10/02/2013 Document Reviewed: 10/02/2013 [facility] Patient Information 2015 [place], [place]. This information is not intended to replace advice given to you by your health care provider. Make sure you discuss any  questions you have with your health care provider.

## 2015-08-22 ENCOUNTER — Other Ambulatory Visit: Payer: Self-pay | Admitting: Internal Medicine

## 2015-08-22 LAB — CULTURE, ROUTINE-ABSCESS
CULTURE: NO GROWTH
Special Requests: NORMAL

## 2015-08-26 ENCOUNTER — Encounter: Payer: Self-pay | Admitting: Internal Medicine

## 2015-08-26 ENCOUNTER — Ambulatory Visit (INDEPENDENT_AMBULATORY_CARE_PROVIDER_SITE_OTHER): Payer: Medicare PPO | Admitting: Internal Medicine

## 2015-08-26 VITALS — BP 110/50 | HR 86 | Ht 66.0 in | Wt 175.4 lb

## 2015-08-26 DIAGNOSIS — I5032 Chronic diastolic (congestive) heart failure: Secondary | ICD-10-CM | POA: Diagnosis not present

## 2015-08-26 DIAGNOSIS — I483 Typical atrial flutter: Secondary | ICD-10-CM | POA: Diagnosis not present

## 2015-08-26 DIAGNOSIS — I4892 Unspecified atrial flutter: Secondary | ICD-10-CM | POA: Insufficient documentation

## 2015-08-26 DIAGNOSIS — H43813 Vitreous degeneration, bilateral: Secondary | ICD-10-CM | POA: Diagnosis not present

## 2015-08-26 DIAGNOSIS — E119 Type 2 diabetes mellitus without complications: Secondary | ICD-10-CM | POA: Diagnosis not present

## 2015-08-26 DIAGNOSIS — H52203 Unspecified astigmatism, bilateral: Secondary | ICD-10-CM | POA: Diagnosis not present

## 2015-08-26 DIAGNOSIS — H35373 Puckering of macula, bilateral: Secondary | ICD-10-CM | POA: Diagnosis not present

## 2015-08-26 DIAGNOSIS — I480 Paroxysmal atrial fibrillation: Secondary | ICD-10-CM

## 2015-08-26 LAB — HM DIABETES EYE EXAM

## 2015-08-26 NOTE — Patient Instructions (Signed)
Medication Instructions:  Your physician recommends that you continue on your current medications as directed. Please refer to the Current Medication list given to you today.3  Labwork: None ordered  Testing/Procedures: None ordered  Follow-Up: No follow up is needed with Dr. Rayann Heman at this time.  He will see you on an as needed basis.  Any Other Special Instructions Will Be Listed Below (If Applicable). Thank you for choosing [facility]!!     Janan Halter, RN [phone]

## 2015-08-26 NOTE — Progress Notes (Signed)
Electrophysiology Office Note   Date:  08/26/2015   ID:  Andrew [name], DOB 03/08/1928, MRN [id number]  PCP:  [name], MD  Cardiologist:  Dr [name] Primary Electrophysiologist: Thompson Grayer, MD    Chief Complaint  Patient presents with  . PAF  . Chronic Diastolic CHF     History of Present Illness: Andrew [name] is a 81 y.o. male who presents today for electrophysiology evaluation.   He has a h/o afib and atrial flutter (typical).  He has had symptoms of CHF secondary to AF.  This appears to have initially presented in [place] 5/16.  He had afib at that time with CHF.  He was evaluated by Dr [name] and documented to have afib 6/16.  He was placed on eliquis and amiodarone.  He converted to sinus rhythm.  He developed UTI.  Upon follow-up with Dr [name] he was noted to have atrial flutter.  He converted spontaneously to sinus rhythm.  He presents today for further evaluation.  He is doing well presently. Today, he denies symptoms of palpitations, chest pain, shortness of breath, orthopnea, PND, lower extremity edema, claudication, dizziness, presyncope, syncope, bleeding, or neurologic sequela. The patient is tolerating medications without difficulties and is otherwise without complaint today.    Past Medical History  Diagnosis Date  . CAD (coronary artery disease)   . LVF (left ventricular failure)   . Carotid artery stenosis   . Hyperlipidemia   . Hypertension   . GERD (gastroesophageal reflux disease)   . IBS (irritable bowel syndrome)   . Arthritis   . Diabetes mellitus   . Thyroid disease   . Anemia   . Vitamin D deficiency   . Depression   . Fatty liver 2008  . Cancer     Melanoma - Back  . AAA (abdominal aortic aneurysm)   . Pneumonia Jan. 2014  . CHF (congestive heart failure)   . DVT (deep venous thrombosis)   . Paroxysmal atrial fibrillation   . Typical atrial flutter    Past Surgical History  Procedure Laterality Date  . Total knee arthroplasty        bilateral- Pt fell 3 steps  . Cardiac stents  2005  . Coronary artery bypass graft  1992  . Joint replacement      Bilateral knee arthroplasty  . Eye surgery      Catarart  . Abdominal aortic aneurysm repair  2002  . Cardiac catheterization    . Left heart catheterization with coronary angiogram N/A 10/30/2014    Procedure: LEFT HEART CATHETERIZATION WITH CORONARY ANGIOGRAM;  Surgeon: Larey Dresser, MD;  Location: [facility] CATH LAB;  Service: Cardiovascular;  Laterality: N/A;  . [place]  June 2016    While in [place]  2016     Current Outpatient Prescriptions  Medication Sig Dispense Refill  . acetaminophen (TYLENOL) 500 MG tablet Take 1,000 mg by mouth 2 (two) times daily.     Marland Kitchen albuterol (PROVENTIL HFA;VENTOLIN HFA) 108 (90 BASE) MCG/ACT inhaler Inhale 2 puffs into the lungs every 4 (four) hours as needed for wheezing or shortness of breath. 1 Inhaler 0  . ALPRAZolam (XANAX) 0.5 MG tablet Take 0.5 mg by mouth 2 (two) times daily as needed for anxiety or sleep.    Marland Kitchen amiodarone (PACERONE) 200 MG tablet Take 1 tablet (200 mg total) by mouth 2 (two) times daily. 60 tablet 3  . apixaban (ELIQUIS) 2.5 MG TABS tablet Take 1 tablet (2.5 mg total) by mouth  2 (two) times daily. 60 tablet 3  . aspirin EC 81 MG tablet Take 1 tablet (81 mg total) by mouth daily.    Marland Kitchen atorvastatin (LIPITOR) 80 MG tablet Take 80 mg by mouth daily.    . cilostazol (PLETAL) 100 MG tablet Take 100 mg by mouth 2 (two) times daily.    . clopidogrel (PLAVIX) 75 MG tablet Take 1 tablet by mouth daily.    . Coenzyme Q10 (CO Q 10 PO) Take 1 tablet by mouth daily.    Marland Kitchen erythromycin with ethanol (THERAMYCIN) 2 % external solution Apply 1 application topically daily as needed (for skin irritation around neck).    . ezetimibe (ZETIA) 10 MG tablet Take 10 mg by mouth daily.    Marland Kitchen FLUoxetine (PROZAC) 20 MG capsule Take 1 capsule (20 mg total) by mouth daily. 90 capsule 3  . furosemide (LASIX) 40 MG tablet Take 1.5 tablets (60 mg total)  by mouth 2 (two) times daily. 90 tablet 0  . glucosamine-chondroitin 500-400 MG tablet Take 1 tablet by mouth 2 (two) times daily.     . Hypromellose (GENTEAL MILD) 0.2 % SOLN Place 1 drop into both eyes daily as needed (for dry eyes).     Marland Kitchen losartan (COZAAR) 100 MG tablet Take 100 mg by mouth daily.    . Misc Natural Products (NF FORMULAS TESTOSTERONE) CAPS Take 1 capsule by mouth daily.     . Multiple Vitamin (MULTIVITAMIN) tablet Take 1 tablet by mouth daily.    Marland Kitchen NIFEdipine (PROCARDIA-XL/ADALAT-CC/NIFEDICAL-XL) 30 MG 24 hr tablet Take 30 mg by mouth daily.    Marland Kitchen NITROSTAT 0.4 MG SL tablet DISSOLVE 1 TABLET UNDER TONGUE AS NEEDED FOR CHEST PAIN,MAY REPEAT IN5 MINUTES FOR 2 DOSES. 25 tablet 3  . omega-3 acid ethyl esters (LOVAZA) 1 G capsule Take 1 g by mouth 2 (two) times daily.    Marland Kitchen oxyCODONE-acetaminophen (PERCOCET) 7.5-325 MG per tablet Take 1 tablet by mouth every 4 (four) hours as needed. (Patient taking differently: Take 1 tablet by mouth every 4 (four) hours as needed (pain). ) 30 tablet 0  . polyethylene glycol (MIRALAX / GLYCOLAX) packet Take 17 g by mouth daily as needed (constipation).    . potassium chloride SA (K-DUR,KLOR-CON) 20 MEQ tablet Take 20 mEq by mouth daily.    . tamsulosin (FLOMAX) 0.4 MG CAPS capsule Take 0.4 mg by mouth daily.    . temazepam (RESTORIL) 30 MG capsule Take 30 mg by mouth at bedtime as needed for sleep.     No current facility-administered medications for this visit.    Allergies:   Metoprolol and Metformin and related   Social History:  The patient  reports that he has quit smoking. He has never used smokeless tobacco. He reports that he drinks about 1.8 oz of alcohol per week. He reports that he does not use illicit drugs.   Family History:  The patient's  family history includes Cancer in his brother and father; Colon cancer in his father; Coronary artery disease in his brother; Diabetes in his brother and son; Heart disease in his brother;  Hyperlipidemia in his brother and son; Peripheral vascular disease in his brother.    ROS:  Please see the history of present illness.   All other systems are reviewed and negative.    PHYSICAL EXAM: VS:  BP 110/50 mmHg  Pulse 86  Ht 5\' 6"  (1.676 m)  Wt 79.561 kg (175 lb 6.4 oz)  BMI 28.32 kg/m2 , BMI Body  mass index is 28.32 kg/(m^2). GEN: Well nourished, well developed, in no acute distress HEENT: normal Neck: no JVD, carotid bruits, or masses Cardiac: RRR;,+1 chronic edema  Respiratory:  clear to auscultation bilaterally, normal work of breathing GI: soft, nontender, nondistended, + BS MS: age appropriate atrophy Skin: warm and dry  Neuro:  Strength and sensation are intact Psych: euthymic mood, full affect  EKG:  EKG is ordered today. The ekg ordered today shows sinus rhythm 86 bpm, PR 264, IVCD Prior ekgs documenting afib (6/16) and typical atrial flutter (8/16) are reviewed   Recent Labs: 06/30/2015: TSH 3.916 07/06/2015: Magnesium 2.2 07/07/2015: ALT 33 07/15/2015: BUN 58*; Creatinine, Ser 1.98*; Potassium 4.2; Sodium 140 07/30/2015: B Natriuretic Peptide 323.3* 08/19/2015: Hemoglobin 8.2*; Platelets 233    Lipid Panel     Component Value Date/Time   CHOL 110 01/19/2015 1036   TRIG 57.0 01/19/2015 1036   HDL 47.20 01/19/2015 1036   CHOLHDL 2 01/19/2015 1036   VLDL 11.4 01/19/2015 1036   LDLCALC 51 01/19/2015 1036   LDLDIRECT 78.6 03/18/2010 0854     Wt Readings from Last 3 Encounters:  08/26/15 79.561 kg (175 lb 6.4 oz)  08/13/15 79.379 kg (175 lb)  07/30/15 81.988 kg (180 lb 12 oz)      Other studies Reviewed: Additional studies/ records that were reviewed today include: Dr Oleh Genin notes, prior echos   ASSESSMENT AND PLAN:  Pt with multiple comoribidites including vascular disease, renal failure now presents for AF/AFl eval.  He is 1 hour late to his appointment and is wearing bedroom slippers to the office today.  1.  paroxysmal atrial fibrillation and  atrial flutter The patient has relatively recent diagnoses of afib and typical atrial flutter.  He has been initiated on amiodarone and is in sinus today.  His chads2vasc score is quite high.  He is appropriately anticoagulated with renally dosed eliquis.  Given renal failure and evidence of electrical system disease by ekg, his AAD options are limited.  He appears to be tolerating amiodarone and is in sinus rhythm today. Given advanced age, renal failure, and comorbidities, I would advise that we avoid ablation if possible.  Continue amiodarone with close follow-up with Dr [name].  Dr [name] to reduce amiodarone to 200mg  daily in the next few weeks.  Continue long term anticoagulation. If he has additional difficulty with atrial arrhythmias we could consider ablation at that time, though I think that a more conservative route would be preferred.  He will follow closely with Dr [name] and I will see as needed going forward.   Current medicines are reviewed at length with the patient today.   The patient does not have concerns regarding his medicines.  The following changes were made today:  none   Signed, Thompson Grayer, MD  08/26/2015 12:43 PM     [place] [address] [phone] (office) [phone] (fax)

## 2015-08-27 ENCOUNTER — Telehealth: Payer: Self-pay | Admitting: Internal Medicine

## 2015-08-27 ENCOUNTER — Inpatient Hospital Stay (HOSPITAL_COMMUNITY): Admission: RE | Admit: 2015-08-27 | Payer: Medicare PPO | Source: Ambulatory Visit

## 2015-08-27 NOTE — Telephone Encounter (Signed)
[facility] called to inform you that pt had fallen outside and EMS was called to assist. Only injuries pt had was scrapes on back of elbow, head and knee.

## 2015-08-29 ENCOUNTER — Other Ambulatory Visit: Payer: Self-pay | Admitting: Cardiology

## 2015-09-01 ENCOUNTER — Telehealth: Payer: Self-pay | Admitting: Cardiology

## 2015-09-01 DIAGNOSIS — N281 Cyst of kidney, acquired: Secondary | ICD-10-CM | POA: Diagnosis not present

## 2015-09-01 NOTE — Telephone Encounter (Signed)
Pt no showed for his appt w/Dr [name] in the CHF clinic on 8/18, [place] please call pt to reschedule appt

## 2015-09-01 NOTE — Telephone Encounter (Signed)
Patient followed in Heart Failure Clinic, will forward to HF.

## 2015-09-01 NOTE — Telephone Encounter (Signed)
New problem    Pt and his wife stated they spoke personally to Dr [name] and he stated his nurse would call them with an appt in 2wks, and they were waiting on call back from nurse with an appt. Please advise. They also stated they would see him at church tonight.

## 2015-09-03 ENCOUNTER — Telehealth: Payer: Self-pay | Admitting: Internal Medicine

## 2015-09-03 ENCOUNTER — Ambulatory Visit (INDEPENDENT_AMBULATORY_CARE_PROVIDER_SITE_OTHER): Payer: Medicare PPO

## 2015-09-03 ENCOUNTER — Ambulatory Visit (INDEPENDENT_AMBULATORY_CARE_PROVIDER_SITE_OTHER): Payer: Medicare PPO | Admitting: Family Medicine

## 2015-09-03 VITALS — BP 120/60 | HR 89 | Temp 97.1°F | Resp 18 | Ht 66.0 in | Wt 180.0 lb

## 2015-09-03 DIAGNOSIS — M25531 Pain in right wrist: Secondary | ICD-10-CM

## 2015-09-03 DIAGNOSIS — S0083XA Contusion of other part of head, initial encounter: Secondary | ICD-10-CM

## 2015-09-03 DIAGNOSIS — S5011XA Contusion of right forearm, initial encounter: Secondary | ICD-10-CM

## 2015-09-03 NOTE — Progress Notes (Addendum)
Subjective:  This chart was scribed for Andrew Parish, MD by [name], Medical Scribe. This patient was seen in Room 4 and the patient's care was started at 11:04 AM.   Patient ID: [name], male    DOB: July 14, 1928, 81 y.o.   MRN: [id number]  Chief Complaint  Patient presents with  . Arm Pain    right arm/ pt. fell in his home, x 2 days  . swelling and brusing    x 2 days   HPI HPI Comments: Andrew [name] is a 81 y.o. male with a past medical history of AAA and CHF who presents to [facility] complaining of a right arm injury, onset 2 days ago.  Pt notes that he fell by feeling unsteady in his feet and tripping on some furniture at home. Pt states that he has a previous history of frequent falls due to his unsteady gait. He indicates that the area on his arm is accompanied with swelling and bruising. He also indicates that he has injured his chin, however he would not like it to be evaluated.  Pt also presents with a wound on the scalp, also secondary to the fall. Pt denies loss of consciousness. Pt is on blood thinners.     Patient Active Problem List   Diagnosis Date Noted  . Atrial flutter 08/26/2015  . Peripelvic (lymphatic) cyst   . Pernicious anemia 07/15/2015  . Bilateral renal cysts 07/12/2015  . Lung nodule, 85mm RML CT 07/12/15 07/12/2015  . Constipation 07/12/2015  . Kidney stone on right side 07/12/2015  . UTI (lower urinary tract infection) 07/06/2015  . AKI (acute kidney injury) 07/06/2015  . CKD (chronic kidney disease), stage III 06/14/2015  . Chronic diastolic CHF (congestive heart failure) 05/28/2015  . Paroxysmal atrial fibrillation 05/28/2015  . Routine general medical examination at a health care facility 03/24/2015  . AAA (abdominal aortic aneurysm) without rupture 09/30/2014  . Left-sided low back pain without sciatica 07/21/2014  . Cardiomyopathy, ischemic 05/23/2014  . Peripheral vascular disease 09/17/2013  . DM  (diabetes mellitus), type 2 with renal complications 0000000  . Thrombocytopenia 03/28/2013  . CORONARY ATHEROSCLEROSIS NATIVE CORONARY ARTERY 11/09/2010  . ANXIETY DEPRESSION 03/18/2010  . Hyperlipidemia with target LDL less than 100 06/14/2007  . Essential hypertension 06/14/2007  . GERD 06/14/2007   Past Medical History  Diagnosis Date  . CAD (coronary artery disease)   . LVF (left ventricular failure)   . Carotid artery stenosis   . Hyperlipidemia   . Hypertension   . GERD (gastroesophageal reflux disease)   . IBS (irritable bowel syndrome)   . Arthritis   . Diabetes mellitus   . Thyroid disease   . Anemia   . Vitamin D deficiency   . Depression   . Fatty liver 2008  . Cancer     Melanoma - Back  . AAA (abdominal aortic aneurysm)   . Pneumonia Jan. 2014  . CHF (congestive heart failure)   . DVT (deep venous thrombosis)   . Paroxysmal atrial fibrillation   . Typical atrial flutter    Past Surgical History  Procedure Laterality Date  . Total knee arthroplasty      bilateral- Pt fell 3 steps  . Cardiac stents  2005  . Coronary artery bypass graft  1992  . Joint replacement      Bilateral knee arthroplasty  . Eye surgery      Catarart  . Abdominal aortic aneurysm repair  2002  . Cardiac catheterization    . Left heart catheterization with coronary angiogram N/A 10/30/2014    Procedure: LEFT HEART CATHETERIZATION WITH CORONARY ANGIOGRAM;  Surgeon: Larey Dresser, MD;  Location: [facility] CATH LAB;  Service: Cardiovascular;  Laterality: N/A;  . Uti  June 2016    While in [place]  2016   Allergies  Allergen Reactions  . Metoprolol Shortness Of Breath and Palpitations    Heart starts racing. Shallow breathing   . Metformin And Related Nausea And Vomiting   Prior to Admission medications   Medication Sig Start Date End Date Taking? Authorizing Provider  acetaminophen (TYLENOL) 500 MG tablet Take 1,000 mg by mouth 2 (two) times daily.    Yes Historical Provider, MD    ALPRAZolam Duanne Moron) 0.5 MG tablet Take 0.5 mg by mouth 2 (two) times daily as needed for anxiety or sleep.   Yes Historical Provider, MD  apixaban (ELIQUIS) 2.5 MG TABS tablet Take 1 tablet (2.5 mg total) by mouth 2 (two) times daily. 05/27/15  Yes Larey Dresser, MD  aspirin EC 81 MG tablet Take 1 tablet (81 mg total) by mouth daily. 05/22/14  Yes Larey Dresser, MD  atorvastatin (LIPITOR) 80 MG tablet TAKE 1 TABLET ONCE DAILY. 09/01/15  Yes Larey Dresser, MD  cilostazol (PLETAL) 100 MG tablet Take 100 mg by mouth 2 (two) times daily.   Yes Historical Provider, MD  clopidogrel (PLAVIX) 75 MG tablet Take 1 tablet by mouth daily. 08/05/15  Yes Historical Provider, MD  Coenzyme Q10 (CO Q 10 PO) Take 1 tablet by mouth daily.   Yes Historical Provider, MD  ezetimibe (ZETIA) 10 MG tablet Take 10 mg by mouth daily.   Yes Historical Provider, MD  FLUoxetine (PROZAC) 20 MG capsule Take 1 capsule (20 mg total) by mouth daily. 03/05/15  Yes Janith Lima, MD  furosemide (LASIX) 40 MG tablet Take 1.5 tablets (60 mg total) by mouth 2 (two) times daily. 07/12/15  Yes Janece Canterbury, MD  glucosamine-chondroitin 500-400 MG tablet Take 1 tablet by mouth 2 (two) times daily.    Yes Historical Provider, MD  losartan (COZAAR) 100 MG tablet Take 100 mg by mouth daily.   Yes Historical Provider, MD  Misc Natural Products (NF FORMULAS TESTOSTERONE) CAPS Take 1 capsule by mouth daily.    Yes Historical Provider, MD  Multiple Vitamin (MULTIVITAMIN) tablet Take 1 tablet by mouth daily.   Yes Historical Provider, MD  NIFEdipine (PROCARDIA-XL/ADALAT-CC/NIFEDICAL-XL) 30 MG 24 hr tablet Take 30 mg by mouth daily.   Yes Historical Provider, MD  omega-3 acid ethyl esters (LOVAZA) 1 G capsule Take 1 g by mouth 2 (two) times daily.   Yes Historical Provider, MD  polyethylene glycol (MIRALAX / GLYCOLAX) packet Take 17 g by mouth daily as needed (constipation).   Yes Historical Provider, MD  potassium chloride SA (K-DUR,KLOR-CON) 20  MEQ tablet Take 20 mEq by mouth daily.   Yes Historical Provider, MD  tamsulosin (FLOMAX) 0.4 MG CAPS capsule Take 0.4 mg by mouth daily.   Yes Historical Provider, MD  temazepam (RESTORIL) 30 MG capsule Take 30 mg by mouth at bedtime as needed for sleep.   Yes Historical Provider, MD  albuterol (PROVENTIL HFA;VENTOLIN HFA) 108 (90 BASE) MCG/ACT inhaler Inhale 2 puffs into the lungs every 4 (four) hours as needed for wheezing or shortness of breath. Patient not taking: Reported on 09/03/2015 07/12/15   Janece Canterbury, MD  amiodarone (PACERONE) 200 MG tablet Take 1 tablet (  200 mg total) by mouth 2 (two) times daily. Patient not taking: Reported on 09/03/2015 07/30/15   Larey Dresser, MD  erythromycin with ethanol [facility]) 2 % external solution Apply 1 application topically daily as needed (for skin irritation around neck).    Historical Provider, MD  Hypromellose (GENTEAL MILD) 0.2 % SOLN Place 1 drop into both eyes daily as needed (for dry eyes).     Historical Provider, MD  NITROSTAT 0.4 MG SL tablet DISSOLVE 1 TABLET UNDER TONGUE AS NEEDED FOR CHEST PAIN,MAY REPEAT IN5 MINUTES FOR 2 DOSES. Patient not taking: Reported on 09/03/2015 07/13/15   Janith Lima, MD  oxyCODONE-acetaminophen (PERCOCET) 7.5-325 MG per tablet Take 1 tablet by mouth every 4 (four) hours as needed. Patient not taking: Reported on 09/03/2015 07/28/15   Janith Lima, MD   Social History   Social History  . Marital Status: Married    Spouse Name: N/A  . Number of Children: N/A  . Years of Education: N/A   Occupational History  . Retired    Social History Main Topics  . 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 Smokeless tobacco: Never Used     Comment: quit atleast 25 yrs ago, per pt  . Alcohol Use: 1.8 oz/week    3 Shots of liquor per week     Comment: socially  . Drug Use: No  . Sexual Activity: Yes   Other Topics Concern  . Not on file   Social History Narrative   Pt lives in [place] with spouse.   Retired from  [employer].  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    Review of Systems  Musculoskeletal: Positive for gait problem.  Skin: Positive for color change and wound.      Objective:   Physical Exam  Constitutional: He is oriented to person, place, and time. He appears well-developed and well-nourished. No distress.  HENT:  Head: Normocephalic and atraumatic.  Eyes: EOM are normal. Pupils are equal, round, and reactive to light.  Neck: Neck supple.  Cardiovascular: Normal rate.   Pulmonary/Chest: Effort normal.  Musculoskeletal:  Right elbow- Full range of motion, no bony tenderness,  Right forearm- tenderness with some soft tissue swelling over the distal ulna, very large hematoma/soft tissue swelling on the mid to proximal area with dried blood in overlying bandage both here and on lateral elbow surface. After cleaning the area, there are two abrasions with a clot/ scab over the abrasion of his lateral elbow, and extensor surface of his forearm, no active bleeding.  Right wrist- non tender, equal range of motion to the left side.  Hand- Equal grip strength. Full range of motion of the fingers.    Chin- small abrasion at the mid aspect with ecchymosis with the left side, otherwise skin is intact. No bony tenderness.  Mouth- Bruising inside the lower lip. No bruised teeth. No open wound.    Scalp- Small abrasions, does not widen with tension.     Neurological: He is alert and oriented to person, place, and time. No cranial nerve deficit.  Skin: Skin is warm and dry.  Psychiatric: He has a normal mood and affect. His behavior is normal.  Nursing note and vitals reviewed.  Filed Vitals:   09/03/15 1040  BP: 120/60  Pulse: 89  Temp: 97.1 F (36.2 C)  TempSrc: Oral  Resp: 18  Height: 5\' 6"  (1.676 m)  Weight: 180 lb (81.647 kg)  SpO2: 96%   [facility] (  PRIMARY) x-ray report read by Dr. Corliss [name]- Right forearm: no apparent fracture.  There is a small abnormal area on the middle third of the radius at 10 cm distal to the radial head, but no fracture seen on other views.      Assessment & Plan:  Andrew [name] is a 81 y.o. male Pain in joint, forearm, right - Plan: DG Forearm Right  Contusion of chin, initial encounter  Contusion, forearm, right, initial encounter - Plan: DG Forearm Right  Multiple contusions and abrasions including chin, scalp, and arm with soft tissue swelling of forearm from likely bony contusion.. No apparent fracture. Wound care discussed, symptomatic care, and return to clinic precautions for these areas were discussed. Understanding expressed.  Wound care also discussed for the various abrasions.   No orders of the defined types were placed in this encounter.   Patient Instructions  I do not see any broken bones on your x-ray today, but will have the radiologist also read it. Continue Ace bandage as needed for the swelling, soap and water cleansing overall the abrasions and wounds, if any surrounding redness or drainage or other signs of infection, return here or your primary care provider right away to examine these areas. If the swelling and discomfort your forearm is not improving the next week to 10 days, recommend follow-up with myself or your primary care provider.  For the wounds on your scalp and chin, continue soap and water cleansing, but if any new headaches, lightheadedness, dizziness, blurry vision, or other new symptoms, recommend evaluation through the emergency room.  Contusion A contusion is a deep bruise. Contusions are the result of an injury that caused bleeding under the skin. The contusion may turn blue, purple, or yellow. Minor injuries will give you a painless contusion, but more severe contusions may stay painful and swollen for a few weeks.  CAUSES  A contusion is usually caused by a blow, trauma, or direct force to an area of the body. SYMPTOMS   Swelling and redness  of the injured area.  Bruising of the injured area.  Tenderness and soreness of the injured area.  Pain. DIAGNOSIS  The diagnosis can be made by taking a history and physical exam. An X-ray, CT scan, or MRI may be needed to determine if there were any associated injuries, such as fractures. TREATMENT  Specific treatment will depend on what area of the body was injured. In general, the best treatment for a contusion is resting, icing, elevating, and applying cold compresses to the injured area. Over-the-counter medicines may also be recommended for pain control. Ask your caregiver what the best treatment is for your contusion. HOME CARE INSTRUCTIONS   Put ice on the injured area.  Put ice in a plastic bag.  Place a towel between your skin and the bag.  Leave the ice on for 15-20 minutes, 3-4 times a day, or as directed by your health care provider.  Only take over-the-counter or prescription medicines for pain, discomfort, or fever as directed by your caregiver. Your caregiver may recommend avoiding anti-inflammatory medicines (aspirin, ibuprofen, and naproxen) for 48 hours because these medicines may increase bruising.  Rest the injured area.  If possible, elevate the injured area to reduce swelling. SEEK IMMEDIATE MEDICAL CARE IF:   You have increased bruising or swelling.  You have pain that is getting worse.  Your swelling or pain is not relieved with medicines. MAKE SURE YOU:   Understand these instructions.  Will watch your  condition.  Will get help right away if you are not doing well or get worse. Document Released: 09/21/2005 Document Revised: 12/17/2013 Document Reviewed: 10/17/2011 [facility] Patient Information 2015 [place], [place]. This information is not intended to replace advice given to you by your health care provider. Make sure you discuss any questions you have with your health care provider.  Abrasion An abrasion is a cut or scrape of the skin. Abrasions  do not extend through all layers of the skin and most heal within 10 days. It is important to care for your abrasion properly to prevent infection. CAUSES  Most abrasions are caused by falling on, or gliding across, the ground or other surface. When your skin rubs on something, the outer and inner layer of skin rubs off, causing an abrasion. DIAGNOSIS  Your caregiver will be able to diagnose an abrasion during a physical exam.  TREATMENT  Your treatment depends on how large and deep the abrasion is. Generally, your abrasion will be cleaned with water and a mild soap to remove any dirt or debris. An antibiotic ointment may be put over the abrasion to prevent an infection. A bandage (dressing) may be wrapped around the abrasion to keep it from getting dirty.  You may need a tetanus shot if:  You cannot remember when you had your last tetanus shot.  You have never had a tetanus shot.  The injury broke your skin. If you get a tetanus shot, your arm may swell, get red, and feel warm to the touch. This is common and not a problem. If you need a tetanus shot and you choose not to have one, there is a rare chance of getting tetanus. Sickness from tetanus can be serious.  HOME CARE INSTRUCTIONS   If a dressing was applied, change it at least once a day or as directed by your caregiver. If the bandage sticks, soak it off with warm water.   Wash the area with water and a mild soap to remove all the ointment 2 times a day. Rinse off the soap and pat the area dry with a clean towel.   Reapply any ointment as directed by your caregiver. This will help prevent infection and keep the bandage from sticking. Use gauze over the wound and under the dressing to help keep the bandage from sticking.   Change your dressing right away if it becomes wet or dirty.   Only take over-the-counter or prescription medicines for pain, discomfort, or fever as directed by your caregiver.   Follow up with your caregiver  within 24-48 hours for a wound check, or as directed. If you were not given a wound-check appointment, look closely at your abrasion for redness, swelling, or pus. These are signs of infection. SEEK IMMEDIATE MEDICAL CARE IF:   You have increasing pain in the wound.   You have redness, swelling, or tenderness around the wound.   You have pus coming from the wound.   You have a fever or persistent symptoms for more than 2-3 days.  You have a fever and your symptoms suddenly get worse.  You have a bad smell coming from the wound or dressing.  MAKE SURE YOU:   Understand these instructions.  Will watch your condition.  Will get help right away if you are not doing well or get worse. Document Released: 09/21/2005 Document Revised: 11/28/2012 Document Reviewed: 11/15/2011 [facility] Patient Information 2015 [place], [place]. This information is not intended to replace advice given to you by your  health care provider. Make sure you discuss any questions you have with your health care provider.     \ I personally performed the services described in this documentation, which was scribed in my presence. The recorded information has been reviewed and considered, and addended by me as needed.
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
## 2015-09-03 NOTE — Patient Instructions (Signed)
I do not see any broken bones on your x-ray today, but will have the radiologist also read it. Continue Ace bandage as needed for the swelling, soap and water cleansing overall the abrasions and wounds, if any surrounding redness or drainage or other signs of infection, return here or your primary care provider right away to examine these areas. If the swelling and discomfort your forearm is not improving the next week to 10 days, recommend follow-up with myself or your primary care provider.  For the wounds on your scalp and chin, continue soap and water cleansing, but if any new headaches, lightheadedness, dizziness, blurry vision, or other new symptoms, recommend evaluation through the emergency room.  Contusion A contusion is a deep bruise. Contusions are the result of an injury that caused bleeding under the skin. The contusion may turn blue, purple, or yellow. Minor injuries will give you a painless contusion, but more severe contusions may stay painful and swollen for a few weeks.  CAUSES  A contusion is usually caused by a blow, trauma, or direct force to an area of the body. SYMPTOMS   Swelling and redness of the injured area.  Bruising of the injured area.  Tenderness and soreness of the injured area.  Pain. DIAGNOSIS  The diagnosis can be made by taking a history and physical exam. An X-ray, CT scan, or MRI may be needed to determine if there were any associated injuries, such as fractures. TREATMENT  Specific treatment will depend on what area of the body was injured. In general, the best treatment for a contusion is resting, icing, elevating, and applying cold compresses to the injured area. Over-the-counter medicines may also be recommended for pain control. Ask your caregiver what the best treatment is for your contusion. HOME CARE INSTRUCTIONS   Put ice on the injured area.  Put ice in a plastic bag.  Place a towel between your skin and the bag.  Leave the ice on for  15-20 minutes, 3-4 times a day, or as directed by your health care provider.  Only take over-the-counter or prescription medicines for pain, discomfort, or fever as directed by your caregiver. Your caregiver may recommend avoiding anti-inflammatory medicines (aspirin, ibuprofen, and naproxen) for 48 hours because these medicines may increase bruising.  Rest the injured area.  If possible, elevate the injured area to reduce swelling. SEEK IMMEDIATE MEDICAL CARE IF:   You have increased bruising or swelling.  You have pain that is getting worse.  Your swelling or pain is not relieved with medicines. MAKE SURE YOU:   Understand these instructions.  Will watch your condition.  Will get help right away if you are not doing well or get worse. Document Released: 09/21/2005 Document Revised: 12/17/2013 Document Reviewed: 10/17/2011 [facility] Patient Information 2015 [place], [place]. This information is not intended to replace advice given to you by your health care provider. Make sure you discuss any questions you have with your health care provider.  Abrasion An abrasion is a cut or scrape of the skin. Abrasions do not extend through all layers of the skin and most heal within 10 days. It is important to care for your abrasion properly to prevent infection. CAUSES  Most abrasions are caused by falling on, or gliding across, the ground or other surface. When your skin rubs on something, the outer and inner layer of skin rubs off, causing an abrasion. DIAGNOSIS  Your caregiver will be able to diagnose an abrasion during a physical exam.  TREATMENT  Your treatment depends on how large and deep the abrasion is. Generally, your abrasion will be cleaned with water and a mild soap to remove any dirt or debris. An antibiotic ointment may be put over the abrasion to prevent an infection. A bandage (dressing) may be wrapped around the abrasion to keep it from getting dirty.  You may need a tetanus shot  if:  You cannot remember when you had your last tetanus shot.  You have never had a tetanus shot.  The injury broke your skin. If you get a tetanus shot, your arm may swell, get red, and feel warm to the touch. This is common and not a problem. If you need a tetanus shot and you choose not to have one, there is a rare chance of getting tetanus. Sickness from tetanus can be serious.  HOME CARE INSTRUCTIONS   If a dressing was applied, change it at least once a day or as directed by your caregiver. If the bandage sticks, soak it off with warm water.   Wash the area with water and a mild soap to remove all the ointment 2 times a day. Rinse off the soap and pat the area dry with a clean towel.   Reapply any ointment as directed by your caregiver. This will help prevent infection and keep the bandage from sticking. Use gauze over the wound and under the dressing to help keep the bandage from sticking.   Change your dressing right away if it becomes wet or dirty.   Only take over-the-counter or prescription medicines for pain, discomfort, or fever as directed by your caregiver.   Follow up with your caregiver within 24-48 hours for a wound check, or as directed. If you were not given a wound-check appointment, look closely at your abrasion for redness, swelling, or pus. These are signs of infection. SEEK IMMEDIATE MEDICAL CARE IF:   You have increasing pain in the wound.   You have redness, swelling, or tenderness around the wound.   You have pus coming from the wound.   You have a fever or persistent symptoms for more than 2-3 days.  You have a fever and your symptoms suddenly get worse.  You have a bad smell coming from the wound or dressing.  MAKE SURE YOU:   Understand these instructions.  Will watch your condition.  Will get help right away if you are not doing well or get worse. Document Released: 09/21/2005 Document Revised: 11/28/2012 Document Reviewed:  11/15/2011 [facility] Patient Information 2015 [place], [place]. This information is not intended to replace advice given to you by your health care provider. Make sure you discuss any questions you have with your health care provider.

## 2015-09-07 ENCOUNTER — Encounter: Payer: Self-pay | Admitting: Internal Medicine

## 2015-09-07 ENCOUNTER — Ambulatory Visit (INDEPENDENT_AMBULATORY_CARE_PROVIDER_SITE_OTHER): Payer: Medicare PPO | Admitting: Internal Medicine

## 2015-09-07 VITALS — BP 118/54 | HR 82 | Temp 98.3°F | Resp 16 | Wt 182.0 lb

## 2015-09-07 DIAGNOSIS — R270 Ataxia, unspecified: Secondary | ICD-10-CM | POA: Diagnosis not present

## 2015-09-07 DIAGNOSIS — T798XXA Other early complications of trauma, initial encounter: Secondary | ICD-10-CM

## 2015-09-07 DIAGNOSIS — N281 Cyst of kidney, acquired: Secondary | ICD-10-CM | POA: Diagnosis not present

## 2015-09-07 DIAGNOSIS — I10 Essential (primary) hypertension: Secondary | ICD-10-CM | POA: Diagnosis not present

## 2015-09-07 DIAGNOSIS — L089 Local infection of the skin and subcutaneous tissue, unspecified: Secondary | ICD-10-CM | POA: Insufficient documentation

## 2015-09-07 DIAGNOSIS — T148XXA Other injury of unspecified body region, initial encounter: Secondary | ICD-10-CM

## 2015-09-07 MED ORDER — MUPIROCIN CALCIUM 2 % EX CREA: 1.0000 "application " | TOPICAL_CREAM | Freq: Two times a day (BID) | CUTANEOUS | Status: DC

## 2015-09-07 MED ORDER — AMOXICILLIN-POT CLAVULANATE 875-125 MG PO TABS: 1.0000 | ORAL_TABLET | Freq: Two times a day (BID) | ORAL | Status: DC

## 2015-09-07 NOTE — Progress Notes (Signed)
Subjective:  Patient ID: Andrew [name], male    DOB: [birth year]  Age: 81 y.o. MRN: [id number]  CC: Wound Infection   HPI Andrew [name] presents for wounds on both lower extremities. He fell about 3 weeks ago and was stuck outside laying on the ground for about an hour or maybe an hour and a half. During that time his lower legs were exposed to sun and he developed a sunburn. He felt the sunburn was healing pretty well but now for the last few days on the left lower extremity he has noticed worsening pain, redness, swelling, some discharge from some of the scab and chills. His wife reports that over the last few months he has had multiple falls. He complains of ataxia but no other focal symptoms. He is not willing to adjust his activity level, to change his living situation, or do physical therapy for the ataxia. He wants to see a neurologist.  Outpatient Prescriptions Prior to Visit  Medication Sig Dispense Refill  . acetaminophen (TYLENOL) 500 MG tablet Take 1,000 mg by mouth 2 (two) times daily.     Marland Kitchen ALPRAZolam (XANAX) 0.5 MG tablet Take 0.5 mg by mouth 2 (two) times daily as needed for anxiety or sleep.    Marland Kitchen apixaban (ELIQUIS) 2.5 MG TABS tablet Take 1 tablet (2.5 mg total) by mouth 2 (two) times daily. 60 tablet 3  . aspirin EC 81 MG tablet Take 1 tablet (81 mg total) by mouth daily.    Marland Kitchen atorvastatin (LIPITOR) 80 MG tablet TAKE 1 TABLET ONCE DAILY. 30 tablet 0  . cilostazol (PLETAL) 100 MG tablet Take 100 mg by mouth 2 (two) times daily.    . clopidogrel (PLAVIX) 75 MG tablet Take 1 tablet by mouth daily.    . Coenzyme Q10 (CO Q 10 PO) Take 1 tablet by mouth daily.    Marland Kitchen erythromycin with ethanol (THERAMYCIN) 2 % external solution Apply 1 application topically daily as needed (for skin irritation around neck).    . ezetimibe (ZETIA) 10 MG tablet Take 10 mg by mouth daily.    Marland Kitchen FLUoxetine (PROZAC) 20 MG capsule Take 1 capsule (20 mg total) by mouth daily. 90 capsule 3  . furosemide  (LASIX) 40 MG tablet Take 1.5 tablets (60 mg total) by mouth 2 (two) times daily. 90 tablet 0  . glucosamine-chondroitin 500-400 MG tablet Take 1 tablet by mouth 2 (two) times daily.     . Hypromellose (GENTEAL MILD) 0.2 % SOLN Place 1 drop into both eyes daily as needed (for dry eyes).     Marland Kitchen losartan (COZAAR) 100 MG tablet Take 100 mg by mouth daily.    . Misc Natural Products (NF FORMULAS TESTOSTERONE) CAPS Take 1 capsule by mouth daily.     . Multiple Vitamin (MULTIVITAMIN) tablet Take 1 tablet by mouth daily.    Marland Kitchen NIFEdipine (PROCARDIA-XL/ADALAT-CC/NIFEDICAL-XL) 30 MG 24 hr tablet Take 30 mg by mouth daily.    Marland Kitchen NITROSTAT 0.4 MG SL tablet DISSOLVE 1 TABLET UNDER TONGUE AS NEEDED FOR CHEST PAIN,MAY REPEAT IN5 MINUTES FOR 2 DOSES. 25 tablet 3  . omega-3 acid ethyl esters (LOVAZA) 1 G capsule Take 1 g by mouth 2 (two) times daily.    . polyethylene glycol (MIRALAX / GLYCOLAX) packet Take 17 g by mouth daily as needed (constipation).    . potassium chloride SA (K-DUR,KLOR-CON) 20 MEQ tablet Take 20 mEq by mouth daily.    . tamsulosin (FLOMAX) 0.4 MG CAPS capsule Take  0.4 mg by mouth daily.    . temazepam (RESTORIL) 30 MG capsule Take 30 mg by mouth at bedtime as needed for sleep.     No facility-administered medications prior to visit.    ROS Review of Systems  Constitutional: Positive for chills. Negative for fever, diaphoresis, activity change, appetite change, fatigue and unexpected weight change.  HENT: Negative.   Eyes: Negative.   Respiratory: Negative.  Negative for cough, choking, chest tightness, shortness of breath and stridor.   Cardiovascular: Negative.  Negative for chest pain, palpitations and leg swelling.  Gastrointestinal: Negative.  Negative for nausea, vomiting, abdominal pain, diarrhea, constipation and blood in stool.  Endocrine: Negative.   Genitourinary: Negative.   Musculoskeletal: Negative.   Skin: Positive for color change and wound. Negative for pallor and rash.   Allergic/Immunologic: Negative.   Neurological: Negative.   Hematological: Negative.  Negative for adenopathy. Does not bruise/bleed easily.  Psychiatric/Behavioral: Negative.     Objective:  BP 118/54 mmHg  Pulse 82  Temp(Src) 98.3 F (36.8 C) (Oral)  Resp 16  Wt 182 lb (82.555 kg)  SpO2 96%  BP Readings from Last 3 Encounters:  09/07/15 118/54  09/03/15 120/60  08/26/15 110/50    Wt Readings from Last 3 Encounters:  09/07/15 182 lb (82.555 kg)  09/03/15 180 lb (81.647 kg)  08/26/15 175 lb 6.4 oz (79.561 kg)    Physical Exam  Constitutional: He is oriented to person, place, and time.  Non-toxic appearance. He does not have a sickly appearance. He does not appear ill. No distress.  HENT:  Mouth/Throat: Oropharynx is clear and moist. No oropharyngeal exudate.  Eyes: Conjunctivae are normal. Right eye exhibits no discharge. Left eye exhibits no discharge. No scleral icterus.  Neck: Normal range of motion. Neck supple. No JVD present. No tracheal deviation present. No thyromegaly present.  Cardiovascular: Normal rate, regular rhythm and intact distal pulses.  Exam reveals no gallop and no friction rub.   No murmur heard. Pulmonary/Chest: Effort normal and breath sounds normal. No stridor. No respiratory distress. He has no wheezes. He has no rales. He exhibits no tenderness.  Abdominal: Soft. Bowel sounds are normal. He exhibits no distension and no mass. There is no tenderness. There is no rebound and no guarding.  Musculoskeletal: Normal range of motion. He exhibits no edema or tenderness.       Legs: Lymphadenopathy:    He has no cervical adenopathy.  Neurological: He is oriented to person, place, and time.  Skin: Skin is warm and dry. No rash noted. He is not diaphoretic. There is erythema. No pallor.  Vitals reviewed.   Lab Results  Component Value Date   WBC 7.2 08/19/2015   HGB 8.2* 08/19/2015   HCT 25.1* 08/19/2015   PLT 233 08/19/2015   GLUCOSE 131*  07/15/2015   CHOL 110 01/19/2015   TRIG 57.0 01/19/2015   HDL 47.20 01/19/2015   LDLDIRECT 78.6 03/18/2010   LDLCALC 51 01/19/2015   ALT 33 07/07/2015   AST 40 07/07/2015   NA 140 07/15/2015   K 4.2 07/15/2015   CL 94* 07/15/2015   CREATININE 1.98* 07/15/2015   BUN 58* 07/15/2015   CO2 38* 07/15/2015   TSH 3.916 06/30/2015   PSA 2.79 01/23/2013   INR 1.27 08/19/2015   HGBA1C 6.4 03/23/2015   MICROALBUR 13.2* 03/23/2015    US Biopsy  08/19/2015   INDICATION: Concern for infection of large right-sided parapelvic cyst. Please perform ultrasound-guided aspiration for therapeutic and diagnostic purposes.  EXAM: ULTRASOUND GUIDED RIGHT-SIDED PARAPELVIC CYST ASPIRATION  COMPARISON:  CT abdomen pelvis - 07/12/2015 ; 01/08/2007  MEDICATIONS: Fentanyl 25 mcg IV; Versed 0.5 mg IV  ANESTHESIA/SEDATION: Total Moderate Sedation time  9 minutes  COMPLICATIONS: None immediate  PROCEDURE: Informed written consent was obtained from the patient after a discussion of the risks, benefits and alternatives to treatment. The patient understands and consents the procedure. A timeout was performed prior to the initiation of the procedure.  Ultrasound scanning was performed of the right flank demonstrates a large anechoic cyst compatible with the known dominant parapelvic cyst seen on preceding abdominal CT.  The midline of the right mid back was prepped and draped in usual sterile fashion. Utilizing a medial to lateral trajectory, the parapelvic cyst was accessed with a 10 cm 5-French multi side-hole Yueh sheath needle followed by the aspiration of approximately 320 cc of tan colored slightly cloudy appearing fluid. Ultrasound images were saved for documentation purposes. A sample of the aspirated fluid was capped and sent to the laboratory for analysis. Post procedural scanning demonstrated significant decompression/near resolution of right-sided parapelvic cyst. A dressing was placed. The patient tolerated the  procedure well without immediate post procedural complication.  IMPRESSION: Technically successful ultrasound guided aspiration of dominant right-sided para for pelvic cysts yielding approximately 320 cc of tan colored slightly cloudy appearing fluid. A sample of aspirated fluid was sent to the laboratory for cytologic and gram stain analysis.   Electronically Signed   By: Sandi Mariscal M.D.   On: 08/19/2015 14:57    Assessment & Plan:   Andrew [name] was seen today for wound infection.  Diagnoses and all orders for this visit:  Essential hypertension- his blood pressure is adequately well controlled.  Post-traumatic wound infection, initial encounter- there is no evidence of abscess or foreign body but there does appear to be a mild cellulitis. Will treat systemically with Augmentin and will treat topically with Bactroban cream. -     amoxicillin-clavulanate (AUGMENTIN) 875-125 MG per tablet; Take 1 tablet by mouth 2 (two) times daily. -     mupirocin cream (BACTROBAN) 2 %; Apply 1 application topically 2 (two) times daily.  Ataxia- he will see neurology for further evaluation of this. -     Ambulatory referral to Neurology  I am having Andrew [name] start on amoxicillin-clavulanate and mupirocin cream. I am also having him maintain his glucosamine-chondroitin, NIFEdipine, omega-3 acid ethyl esters, NF FORMULAS TESTOSTERONE, multivitamin, aspirin EC, Hypromellose, erythromycin with ethanol, FLUoxetine, apixaban, acetaminophen, Coenzyme Q10 (CO Q 10 PO), furosemide, NITROSTAT, temazepam, losartan, ALPRAZolam, polyethylene glycol, clopidogrel, cilostazol, tamsulosin, ezetimibe, potassium chloride SA, and atorvastatin.  Meds ordered this encounter  Medications  . amoxicillin-clavulanate (AUGMENTIN) 875-125 MG per tablet    Sig: Take 1 tablet by mouth 2 (two) times daily.    Dispense:  20 tablet    Refill:  1  . mupirocin cream (BACTROBAN) 2 %    Sig: Apply 1 application topically 2 (two) times daily.      Dispense:  30 g    Refill:  1     Follow-up: Return in about 1 week (around 09/14/2015).  [name], MD

## 2015-09-07 NOTE — Patient Instructions (Signed)
Wound Infection °A wound infection happens when a type of germ (bacteria) starts growing in the wound. In some cases, this can cause the wound to break open. If cared for properly, the infected wound will heal from the inside to the outside. Wound infections need treatment. °CAUSES °An infection is caused by bacteria growing in the wound.  °SYMPTOMS  °· Increase in redness, swelling, or pain at the wound site. °· Increase in drainage at the wound site. °· Wound or bandage (dressing) starts to smell bad. °· Fever. °· Feeling tired or fatigued. °· Pus draining from the wound. °TREATMENT  °Your health care provider will prescribe antibiotic medicine. The wound infection should improve within 24 to 48 hours. Any redness around the wound should stop spreading and the wound should be less painful.  °HOME CARE INSTRUCTIONS  °· Only take over-the-counter or prescription medicines for pain, discomfort, or fever as directed by your health care provider. °· Take your antibiotics as directed. Finish them even if you start to feel better. °· Gently wash the area with mild soap and water 2 times a day, or as directed. Rinse off the soap. Pat the area dry with a clean towel. Do not rub the wound. This may cause bleeding. °· Follow your health care provider's instructions for how often you need to change the dressing. °· Apply ointment and a dressing to the wound as directed. °· If the dressing sticks, moisten it with soapy water and gently remove it. °· Change the bandage right away if it becomes wet, dirty, or develops a bad smell. °· Take showers. Do not take tub baths, swim, or do anything that may soak the wound until it is healed. °· Avoid exercises that make you sweat heavily. °· Use anti-itch medicine as directed by your health care provider. The wound may itch when it is healing. Do not pick or scratch at the wound. °· Follow up with your health care provider to get your wound rechecked as directed. °SEEK MEDICAL CARE  IF: °· You have an increase in swelling, pain, or redness around the wound. °· You have an increase in the amount of pus coming from the wound. °· There is a bad smell coming from the wound. °· More of the wound breaks open. °· You have a fever. °MAKE SURE YOU:  °· Understand these instructions. °· Will watch your condition. °· Will get help right away if you are not doing well or get worse. °Document Released: 09/10/2003 Document Revised: 12/17/2013 Document Reviewed: 04/17/2011 °ExitCare® Patient Information ©2015 [employer]. This information is not intended to replace advice given to you by your health care provider. Make sure you discuss any questions you have with your health care provider. ° °

## 2015-09-07 NOTE — Progress Notes (Signed)
Pre visit review using our clinic review tool, if applicable. No additional management support is needed unless otherwise documented below in the visit note. 

## 2015-09-08 ENCOUNTER — Telehealth: Payer: Self-pay | Admitting: Internal Medicine

## 2015-09-08 NOTE — Telephone Encounter (Signed)
Yes

## 2015-09-08 NOTE — Telephone Encounter (Signed)
Ok to give verbal 

## 2015-09-08 NOTE — Telephone Encounter (Signed)
Andrew [name] [phone] [facility]  Need verbals   Was Pt and skilled nursing  1 week 4

## 2015-09-09 NOTE — Telephone Encounter (Signed)
Left message with Elleen: Verbal okay per PCP.

## 2015-09-10 ENCOUNTER — Ambulatory Visit (HOSPITAL_COMMUNITY)
Admission: RE | Admit: 2015-09-10 | Discharge: 2015-09-10 | Disposition: A | Discharge status: Home / Self Care | Payer: Medicare PPO | Source: Ambulatory Visit | Attending: Cardiology | Admitting: Cardiology

## 2015-09-10 ENCOUNTER — Encounter (HOSPITAL_COMMUNITY): Payer: Self-pay

## 2015-09-10 VITALS — BP 124/50 | HR 99 | Wt 183.8 lb

## 2015-09-10 DIAGNOSIS — Z87891 Personal history of nicotine dependence: Secondary | ICD-10-CM | POA: Diagnosis not present

## 2015-09-10 DIAGNOSIS — Z8249 Family history of ischemic heart disease and other diseases of the circulatory system: Secondary | ICD-10-CM | POA: Insufficient documentation

## 2015-09-10 DIAGNOSIS — I5032 Chronic diastolic (congestive) heart failure: Secondary | ICD-10-CM

## 2015-09-10 DIAGNOSIS — I4892 Unspecified atrial flutter: Secondary | ICD-10-CM | POA: Diagnosis not present

## 2015-09-10 DIAGNOSIS — J439 Emphysema, unspecified: Secondary | ICD-10-CM | POA: Insufficient documentation

## 2015-09-10 DIAGNOSIS — I6523 Occlusion and stenosis of bilateral carotid arteries: Secondary | ICD-10-CM | POA: Insufficient documentation

## 2015-09-10 DIAGNOSIS — I480 Paroxysmal atrial fibrillation: Secondary | ICD-10-CM

## 2015-09-10 DIAGNOSIS — N183 Chronic kidney disease, stage 3 unspecified: Secondary | ICD-10-CM

## 2015-09-10 DIAGNOSIS — I2510 Atherosclerotic heart disease of native coronary artery without angina pectoris: Secondary | ICD-10-CM | POA: Diagnosis not present

## 2015-09-10 DIAGNOSIS — Z85820 Personal history of malignant melanoma of skin: Secondary | ICD-10-CM | POA: Diagnosis not present

## 2015-09-10 DIAGNOSIS — Z79899 Other long term (current) drug therapy: Secondary | ICD-10-CM | POA: Insufficient documentation

## 2015-09-10 DIAGNOSIS — E785 Hyperlipidemia, unspecified: Secondary | ICD-10-CM | POA: Diagnosis not present

## 2015-09-10 DIAGNOSIS — N189 Chronic kidney disease, unspecified: Secondary | ICD-10-CM | POA: Diagnosis not present

## 2015-09-10 DIAGNOSIS — R296 Repeated falls: Secondary | ICD-10-CM | POA: Diagnosis not present

## 2015-09-10 DIAGNOSIS — Z951 Presence of aortocoronary bypass graft: Secondary | ICD-10-CM | POA: Insufficient documentation

## 2015-09-10 DIAGNOSIS — Z7902 Long term (current) use of antithrombotics/antiplatelets: Secondary | ICD-10-CM | POA: Insufficient documentation

## 2015-09-10 DIAGNOSIS — I129 Hypertensive chronic kidney disease with stage 1 through stage 4 chronic kidney disease, or unspecified chronic kidney disease: Secondary | ICD-10-CM | POA: Diagnosis not present

## 2015-09-10 DIAGNOSIS — I255 Ischemic cardiomyopathy: Secondary | ICD-10-CM | POA: Insufficient documentation

## 2015-09-10 DIAGNOSIS — E1122 Type 2 diabetes mellitus with diabetic chronic kidney disease: Secondary | ICD-10-CM | POA: Diagnosis not present

## 2015-09-10 DIAGNOSIS — K219 Gastro-esophageal reflux disease without esophagitis: Secondary | ICD-10-CM | POA: Insufficient documentation

## 2015-09-10 DIAGNOSIS — Z7982 Long term (current) use of aspirin: Secondary | ICD-10-CM | POA: Diagnosis not present

## 2015-09-10 LAB — CBC
HCT: 29.2 % — ABNORMAL LOW (ref 39.0–52.0)
Hemoglobin: 9.1 g/dL — ABNORMAL LOW (ref 13.0–17.0)
MCH: 29.3 pg (ref 26.0–34.0)
MCHC: 31.2 g/dL (ref 30.0–36.0)
MCV: 93.9 fL (ref 78.0–100.0)
PLATELETS: 193 10*3/uL (ref 150–400)
RBC: 3.11 MIL/uL — AB (ref 4.22–5.81)
RDW: 16.1 % — AB (ref 11.5–15.5)
WBC: 7.8 10*3/uL (ref 4.0–10.5)

## 2015-09-10 LAB — COMPREHENSIVE METABOLIC PANEL
ALT: 34 U/L (ref 17–63)
ANION GAP: 9 (ref 5–15)
AST: 32 U/L (ref 15–41)
Albumin: 3.5 g/dL (ref 3.5–5.0)
Alkaline Phosphatase: 57 U/L (ref 38–126)
BUN: 36 mg/dL — ABNORMAL HIGH (ref 6–20)
CHLORIDE: 105 mmol/L (ref 101–111)
CO2: 27 mmol/L (ref 22–32)
Calcium: 9.8 mg/dL (ref 8.9–10.3)
Creatinine, Ser: 1.88 mg/dL — ABNORMAL HIGH (ref 0.61–1.24)
GFR calc non Af Amer: 31 mL/min — ABNORMAL LOW (ref >60)
GFR, EST AFRICAN AMERICAN: 36 mL/min — AB (ref >60)
Glucose, Bld: 134 mg/dL — ABNORMAL HIGH (ref 65–99)
Potassium: 4.5 mmol/L (ref 3.5–5.1)
SODIUM: 141 mmol/L (ref 135–145)
Total Bilirubin: 0.6 mg/dL (ref 0.3–1.2)
Total Protein: 7.8 g/dL (ref 6.5–8.1)

## 2015-09-10 LAB — TSH: TSH: 4.761 u[IU]/mL — ABNORMAL HIGH (ref 0.350–4.500)

## 2015-09-10 MED ORDER — AMIODARONE HCL 200 MG PO TABS: 200.0000 mg | ORAL_TABLET | Freq: Every day | ORAL | Status: DC

## 2015-09-10 MED ORDER — FUROSEMIDE 40 MG PO TABS: ORAL_TABLET | ORAL | Status: DC

## 2015-09-10 NOTE — Progress Notes (Signed)
Patient ID: Andrew [name], male   DOB: April 22, 1928, 81 y.o.   MRN: [id number] PCP: Dr. Ronnald Ramp HF. Dr. [name]   81 yo with history of CKD, CAD s/p CABG, and ischemic cardiomyopathy with primarily diastolic CHF presents for cardiology followup.  He has a history of PAD followed at [facility].  I took him for [facility] in 11/15. 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  He had had occlusion of a relatively small LAD, there were right to left collaterals and no intervention was done (medical management).    He and his wife went on a cruise along the [employer] in 5/16.  He admits to considerable dietary indiscretion (high sodium diet).  It appears that he developed a hypertensive crisis along with chest pain while on the ship. He went into atrial fibrillation and developed CHF.  He was taken to the hospital in [place], [place].  He was in the ICU for about a week on Bipap intermittently.  Troponin peaked at 0.09 during this admission.  He was diuresed and after about 2 wks left the hospital and was able to fly home.    He had echo (6/16) showing EF 50-55%, mild LVH.  Cardiolite (6/16) showed EF 42%, prior anterolateral MI, no ischemia.  He was admitted on 07/06/15 for RLQ pain and fever.  He was treated for UTI and had a kidney stone as well.  He developed SOB after IVF were given for AKI.  He was treated for acute COPD exacerbation as well as CHF decompensation.  He was diuresed with IV lasix.  His weight on admission was 179 and got as high as 188 after IVF. Discharge weight was 181.  He presents today for HF follow up. Weight up 3 lb from previous visit.  He had gone into atrial flutter but now back in NSR on amiodarone. Had renal cyst aspiration 08/19/15.  Abdominal pain was good for a few weeks but is increasing again. To see Dr Jeffie Pollock. Breathing is fine, but has not been walking much with falls. To see a neurologist due to imbalance. Denies lightheadedness or dizziness.  Falls usually from losing balance. No CP  or bleeding problems. Unsure about claudication with ongoing leg wounds. Weights at home around 175 - 180. Energy level still down.  He is still taking Plavix though we thought he had stopped it.   ECG: Sinus rhythm with 1st degree AV block, left axis deviation, IVCD 132 msec  Labs (12/14): K 4.8, creatinine 1.5 Labs (5/15): LDL 92 Labs (11/15): K 4.1, creatinine 1.5 Labs (12/15): K 4, creatinine 1.5 Labs (1/16): LFTs normal, LDL 51, HDL 47 Labs (5/16): TnI 0.09 Labs (6/16): K 4.8 => 4.4, creatinine 1.79 => 2.26, HCT 28.7 Labs (7/16) K 4.2, creatinine 1.98, LFTs normal, TSH normal Labs (8/16): HCT 27.1  PMH: 1. CKD 2. HTN 3. Hyperlipidemia 4. AAA: s/p surgery with left renal artery bypass in 1997 5. GERD 6. IBS 7. Melanoma s/p reception 8. Diabetes: Diet-controlled 9. Carotid stenosis: Carotid dopplers (4/15) with 40-59% bilateral stenosis. Carotid dopplers (7/16) with < 40% BICA stenosis.  10. PAD: 9/14 ABIs 0.91 right 1.09 left.  7/16 ABIs 0.86 right, 1.1 left; aortoiliac duplex with < 50% bilateral iliac stenosis.  11. CAD: Has super-dominant RCA. s/p CABG 1992.  He had Taxus DES to mid PDA in 5/02. LHC (1/05) with 90% ostial PDA (had DES to this vessel), totally occluded RIMA-PDA, totally occluded PLV, sequential SVG-PLV with 1 branch occluded.  LHC (11/15) with left main off the right cusp, 40-50% ostial left main, total occlusion of the proximal LAD (small vessel) with right to left collaterals, large super-dominant RCA with 50% ISR mid RCA, 50% ostial PDA, patent SVG-PLV, RIMA-PDA known atretic. Lexiscan Cardiolite (6/16) with EF 42%, prior anterolateral MI, no ischemia.  12. Ischemic cardiomyopathy: Echo (12/09) with EF 45-50%, basal to mid inferolateral hypokinesis. Echo (6/15) with EF 45-50%, akinesis of the mid to apical inferolateral wall.  Echo (6/16) with EF 50-55%, mild LVH, inferior hypokinesis, mild MR.  13. OA 14. Atrial fibrillation/flutter: Paroxysmal.  Noted during  5/16 hospitalization in [place] and in 6/16.  He developed atrial flutter in 8/16, back in NSR by 9/16.  15. Emphysema: PFTs (8/16) with FVC 88%, FEV1 74%, ratio 81%, TLC 83%, DLCO 31%.  16. 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  FH: Brother with CAD.   ROS: All systems reviewed and negative except as per HPI.   Current Outpatient Prescriptions  Medication Sig Dispense Refill  . acetaminophen (TYLENOL) 500 MG tablet Take 1,000 mg by mouth 2 (two) times daily.     Marland Kitchen ALPRAZolam (XANAX) 0.5 MG tablet Take 0.5 mg by mouth 2 (two) times daily as needed for anxiety or sleep.    Marland Kitchen amiodarone (PACERONE) 200 MG tablet Take 200 mg by mouth 2 (two) times daily.    Marland Kitchen amoxicillin-clavulanate (AUGMENTIN) 875-125 MG per tablet Take 1 tablet by mouth 2 (two) times daily. 20 tablet 1  . apixaban (ELIQUIS) 2.5 MG TABS tablet Take 1 tablet (2.5 mg total) by mouth 2 (two) times daily. 60 tablet 3  . aspirin EC 81 MG tablet Take 1 tablet (81 mg total) by mouth daily.    Marland Kitchen atorvastatin (LIPITOR) 80 MG tablet TAKE 1 TABLET ONCE DAILY. 30 tablet 0  . cilostazol (PLETAL) 100 MG tablet Take 100 mg by mouth 2 (two) times daily.    . clopidogrel (PLAVIX) 75 MG tablet Take 1 tablet by mouth daily.    . Coenzyme Q10 (CO Q 10 PO) Take 1 tablet by mouth daily.    Marland Kitchen erythromycin with ethanol (THERAMYCIN) 2 % external solution Apply 1 application topically daily as needed (for skin irritation around neck).    . ezetimibe (ZETIA) 10 MG tablet Take 10 mg by mouth daily.    Marland Kitchen FLUoxetine (PROZAC) 20 MG capsule Take 1 capsule (20 mg total) by mouth daily. 90 capsule 3  . furosemide (LASIX) 40 MG tablet Take 1.5 tablets (60 mg total) by mouth 2 (two) times daily. 90 tablet 0  . glucosamine-chondroitin 500-400 MG tablet Take 1 tablet by mouth 2 (two) times daily.     . Hypromellose (GENTEAL MILD) 0.2 % SOLN Place 1 drop into both eyes daily as needed (for dry eyes).      Marland Kitchen losartan (COZAAR) 100 MG tablet Take 100 mg by mouth daily.    . Misc Natural Products (NF FORMULAS TESTOSTERONE) CAPS Take 1 capsule by mouth daily.     . Multiple Vitamin (MULTIVITAMIN) tablet Take 1 tablet by mouth daily.    . mupirocin cream (BACTROBAN) 2 % Apply 1 application topically 2 (two) times daily. 30 g 1  . NIFEdipine (PROCARDIA-XL/ADALAT-CC/NIFEDICAL-XL) 30 MG 24 hr tablet Take 30 mg by mouth daily.    Marland Kitchen NITROSTAT 0.4 MG SL tablet DISSOLVE 1 TABLET UNDER TONGUE AS NEEDED FOR CHEST PAIN,MAY REPEAT IN5 MINUTES FOR 2 DOSES. 25 tablet 3  .  omega-3 acid ethyl esters (LOVAZA) 1 G capsule Take 1 g by mouth 2 (two) times daily.    . polyethylene glycol (MIRALAX / GLYCOLAX) packet Take 17 g by mouth daily as needed (constipation).    . potassium chloride SA (K-DUR,KLOR-CON) 20 MEQ tablet Take 20 mEq by mouth daily.    . tamsulosin (FLOMAX) 0.4 MG CAPS capsule Take 0.4 mg by mouth daily.    . temazepam (RESTORIL) 30 MG capsule Take 30 mg by mouth at bedtime as needed for sleep.     No current facility-administered medications for this encounter.   BP 124/50 mmHg  Pulse 99  Wt 183 lb 12.8 oz (83.371 kg)  SpO2 93%   General: NAD Neck: JVP 8-9 cm, no thyromegaly or thyroid nodule.  Lungs: Clear to auscultation bilaterally with normal respiratory effort. CV: Nondisplaced PMI.  Heart irregular S1/S2, no S3/S4, 2/6 early SEM RUSB.  1+ ankle edema bilaterally.  Right carotid bruit. Difficult to palpate pedal pulses.  Abdomen: Soft, nontender, non-distended, no hepatosplenomegaly.  Skin: Bilateral LE wounds, covered. Dressing clean, dry, and intact. Neurologic: Alert and oriented x 3.  Psych: Normal affect. Extremities: No clubbing or cyanosis.   Assessment/Plan:  1. CAD: s/p CABG.  Cath in 11/15 showed occluded LAD with left to right collaterals.  The LAD was a relatively small vessel (super-dominant right).  Managed medically. He had chest pain in the setting of an admission to  hospital in [place] with CHF exacerbation.  No chest pain since. Lexiscan Cardiolite in 6/16 with infarction but no ischemia.  - Continue ASA 81.  Stop Plavix (thought he was already off this).   - Continue statin.  2. Ischemic cardiomyopathy with primarily diastolic CHF: EF [id number] on last echo in 6/16.  NYHA class II symptoms currently.  Weight is up, and he appears to have some volume overload on exam. - Increase Lasix to 80 mg am 60 mg pm.  BMET today.  3. Carotid stenosis: Stable, followed at [facility].  4. PAD: Followed at [facility].  Stable ABIs recently.  Continue cilostazol, unable to come off due to severe calf pain when he stops it.  5. Hyperlipidemia: Good lipids in 1/16.  6. CKD: BMET today.  Off losartan since last admission due to elevated creatinine.  7. Atrial fibrillation/flutter:  NSR today. - Decrease amiodarone to 200 mg.  LFTs and TSH today.  Will need to get regular eye exams.  - Continue Eliquis 2.5 bid. STOP Plavix as above. 8. Suspected OSA: He cancelled his sleep study due to mask discomfort 9. History of smoking/low oxygen saturation:  O2 sat 93% today after walking in. PFTs suggestive of emphysema.  10. Leg wounds - From prolonged sun exposure after fall in August.  Seeing PCP for same.  - On Augmentin and Bactroban. - Consider wound center if poor healing. 11. Frequent falls: To see neurology for evaluation of poor balance.  - Highly encouraged to use cane when walking.  Legrand Como "Jonni Sanger" Tillery PA-C 07/30/15  Patient seen with PA, agree with the above note.  He is in NSR, will decrease amiodarone to 200 mg daily and check CMET/TSH.  He is volume overloaded, will increase Lasix to 80 qam, 60 qpm.  He needs to stop Plavix, as above.   Loralie Champagne 09/11/2015

## 2015-09-10 NOTE — Patient Instructions (Signed)
Stop Plavix  Decrease Amiodarone to 200 mg daily  Increase Furosemide (Lasix) to 80 mg (2 tabs) in AM and 60 mg (1 & 1/2 tabs) in PM  Labs today  Your physician recommends that you schedule a follow-up appointment in: 1 month with Dr [name]

## 2015-09-11 ENCOUNTER — Telehealth: Payer: Self-pay | Admitting: Internal Medicine

## 2015-09-11 DIAGNOSIS — I129 Hypertensive chronic kidney disease with stage 1 through stage 4 chronic kidney disease, or unspecified chronic kidney disease: Secondary | ICD-10-CM | POA: Diagnosis not present

## 2015-09-11 DIAGNOSIS — N182 Chronic kidney disease, stage 2 (mild): Secondary | ICD-10-CM | POA: Diagnosis not present

## 2015-09-11 DIAGNOSIS — F329 Major depressive disorder, single episode, unspecified: Secondary | ICD-10-CM | POA: Diagnosis not present

## 2015-09-11 DIAGNOSIS — D649 Anemia, unspecified: Secondary | ICD-10-CM | POA: Diagnosis not present

## 2015-09-11 DIAGNOSIS — I2510 Atherosclerotic heart disease of native coronary artery without angina pectoris: Secondary | ICD-10-CM | POA: Diagnosis not present

## 2015-09-11 DIAGNOSIS — E1122 Type 2 diabetes mellitus with diabetic chronic kidney disease: Secondary | ICD-10-CM | POA: Diagnosis not present

## 2015-09-11 DIAGNOSIS — I5032 Chronic diastolic (congestive) heart failure: Secondary | ICD-10-CM | POA: Diagnosis not present

## 2015-09-11 DIAGNOSIS — Z9181 History of falling: Secondary | ICD-10-CM | POA: Diagnosis not present

## 2015-09-11 DIAGNOSIS — I4891 Unspecified atrial fibrillation: Secondary | ICD-10-CM | POA: Diagnosis not present

## 2015-09-11 NOTE — Telephone Encounter (Signed)
done

## 2015-09-11 NOTE — Telephone Encounter (Signed)
Clair Gulling, PT, called request verbal order to treat patient in home for fall risk reduction 2 times a week for 3 week and 1 time a week for 2 week. Please call him back

## 2015-09-12 DIAGNOSIS — I5032 Chronic diastolic (congestive) heart failure: Secondary | ICD-10-CM | POA: Diagnosis not present

## 2015-09-14 ENCOUNTER — Telehealth: Payer: Self-pay | Admitting: Internal Medicine

## 2015-09-14 ENCOUNTER — Other Ambulatory Visit: Payer: Self-pay | Admitting: Urology

## 2015-09-14 ENCOUNTER — Other Ambulatory Visit: Payer: Self-pay | Admitting: Internal Medicine

## 2015-09-14 DIAGNOSIS — I4891 Unspecified atrial fibrillation: Secondary | ICD-10-CM | POA: Diagnosis not present

## 2015-09-14 DIAGNOSIS — D649 Anemia, unspecified: Secondary | ICD-10-CM | POA: Diagnosis not present

## 2015-09-14 DIAGNOSIS — Z9181 History of falling: Secondary | ICD-10-CM | POA: Diagnosis not present

## 2015-09-14 DIAGNOSIS — N182 Chronic kidney disease, stage 2 (mild): Secondary | ICD-10-CM | POA: Diagnosis not present

## 2015-09-14 DIAGNOSIS — I5032 Chronic diastolic (congestive) heart failure: Secondary | ICD-10-CM | POA: Diagnosis not present

## 2015-09-14 DIAGNOSIS — I129 Hypertensive chronic kidney disease with stage 1 through stage 4 chronic kidney disease, or unspecified chronic kidney disease: Secondary | ICD-10-CM | POA: Diagnosis not present

## 2015-09-14 DIAGNOSIS — F329 Major depressive disorder, single episode, unspecified: Secondary | ICD-10-CM | POA: Diagnosis not present

## 2015-09-14 DIAGNOSIS — I2510 Atherosclerotic heart disease of native coronary artery without angina pectoris: Secondary | ICD-10-CM | POA: Diagnosis not present

## 2015-09-14 DIAGNOSIS — E1122 Type 2 diabetes mellitus with diabetic chronic kidney disease: Secondary | ICD-10-CM | POA: Diagnosis not present

## 2015-09-14 DIAGNOSIS — N281 Cyst of kidney, acquired: Secondary | ICD-10-CM

## 2015-09-14 NOTE — Telephone Encounter (Signed)
[name]  [phone]  Med review a level 1 was identified.  The name of the meds are amiodarone and cilostazol

## 2015-09-15 ENCOUNTER — Telehealth (HOSPITAL_COMMUNITY): Payer: Self-pay | Admitting: *Deleted

## 2015-09-15 ENCOUNTER — Encounter (HOSPITAL_COMMUNITY): Payer: Self-pay | Admitting: *Deleted

## 2015-09-15 ENCOUNTER — Emergency Department (HOSPITAL_COMMUNITY): Payer: Medicare PPO

## 2015-09-15 ENCOUNTER — Emergency Department (HOSPITAL_COMMUNITY)
Admission: EM | Admit: 2015-09-15 | Discharge: 2015-09-15 | Disposition: A | Discharge status: Home / Self Care | Payer: Medicare PPO | Attending: Emergency Medicine | Admitting: Emergency Medicine

## 2015-09-15 ENCOUNTER — Telehealth: Payer: Self-pay | Admitting: Internal Medicine

## 2015-09-15 DIAGNOSIS — Z86718 Personal history of other venous thrombosis and embolism: Secondary | ICD-10-CM | POA: Insufficient documentation

## 2015-09-15 DIAGNOSIS — M7989 Other specified soft tissue disorders: Secondary | ICD-10-CM | POA: Diagnosis not present

## 2015-09-15 DIAGNOSIS — I10 Essential (primary) hypertension: Secondary | ICD-10-CM | POA: Diagnosis not present

## 2015-09-15 DIAGNOSIS — R2243 Localized swelling, mass and lump, lower limb, bilateral: Secondary | ICD-10-CM | POA: Diagnosis present

## 2015-09-15 DIAGNOSIS — Z8701 Personal history of pneumonia (recurrent): Secondary | ICD-10-CM | POA: Diagnosis not present

## 2015-09-15 DIAGNOSIS — Z7982 Long term (current) use of aspirin: Secondary | ICD-10-CM | POA: Diagnosis not present

## 2015-09-15 DIAGNOSIS — N182 Chronic kidney disease, stage 2 (mild): Secondary | ICD-10-CM | POA: Diagnosis not present

## 2015-09-15 DIAGNOSIS — Z7901 Long term (current) use of anticoagulants: Secondary | ICD-10-CM | POA: Insufficient documentation

## 2015-09-15 DIAGNOSIS — Z79899 Other long term (current) drug therapy: Secondary | ICD-10-CM | POA: Diagnosis not present

## 2015-09-15 DIAGNOSIS — Z85828 Personal history of other malignant neoplasm of skin: Secondary | ICD-10-CM | POA: Insufficient documentation

## 2015-09-15 DIAGNOSIS — Z8719 Personal history of other diseases of the digestive system: Secondary | ICD-10-CM | POA: Insufficient documentation

## 2015-09-15 DIAGNOSIS — I509 Heart failure, unspecified: Secondary | ICD-10-CM | POA: Diagnosis not present

## 2015-09-15 DIAGNOSIS — E785 Hyperlipidemia, unspecified: Secondary | ICD-10-CM | POA: Insufficient documentation

## 2015-09-15 DIAGNOSIS — Z951 Presence of aortocoronary bypass graft: Secondary | ICD-10-CM | POA: Diagnosis not present

## 2015-09-15 DIAGNOSIS — E119 Type 2 diabetes mellitus without complications: Secondary | ICD-10-CM | POA: Diagnosis not present

## 2015-09-15 DIAGNOSIS — L03116 Cellulitis of left lower limb: Secondary | ICD-10-CM | POA: Insufficient documentation

## 2015-09-15 DIAGNOSIS — D649 Anemia, unspecified: Secondary | ICD-10-CM | POA: Diagnosis not present

## 2015-09-15 DIAGNOSIS — Z87891 Personal history of nicotine dependence: Secondary | ICD-10-CM | POA: Insufficient documentation

## 2015-09-15 DIAGNOSIS — I2510 Atherosclerotic heart disease of native coronary artery without angina pectoris: Secondary | ICD-10-CM | POA: Diagnosis not present

## 2015-09-15 DIAGNOSIS — I129 Hypertensive chronic kidney disease with stage 1 through stage 4 chronic kidney disease, or unspecified chronic kidney disease: Secondary | ICD-10-CM | POA: Diagnosis not present

## 2015-09-15 DIAGNOSIS — F329 Major depressive disorder, single episode, unspecified: Secondary | ICD-10-CM | POA: Insufficient documentation

## 2015-09-15 DIAGNOSIS — E1122 Type 2 diabetes mellitus with diabetic chronic kidney disease: Secondary | ICD-10-CM | POA: Diagnosis not present

## 2015-09-15 DIAGNOSIS — M1990 Unspecified osteoarthritis, unspecified site: Secondary | ICD-10-CM | POA: Diagnosis not present

## 2015-09-15 DIAGNOSIS — L03115 Cellulitis of right lower limb: Secondary | ICD-10-CM | POA: Diagnosis not present

## 2015-09-15 DIAGNOSIS — I5032 Chronic diastolic (congestive) heart failure: Secondary | ICD-10-CM | POA: Diagnosis not present

## 2015-09-15 DIAGNOSIS — Z9889 Other specified postprocedural states: Secondary | ICD-10-CM | POA: Insufficient documentation

## 2015-09-15 DIAGNOSIS — I480 Paroxysmal atrial fibrillation: Secondary | ICD-10-CM | POA: Insufficient documentation

## 2015-09-15 DIAGNOSIS — I4891 Unspecified atrial fibrillation: Secondary | ICD-10-CM | POA: Diagnosis not present

## 2015-09-15 DIAGNOSIS — Z9861 Coronary angioplasty status: Secondary | ICD-10-CM | POA: Insufficient documentation

## 2015-09-15 DIAGNOSIS — Z9181 History of falling: Secondary | ICD-10-CM | POA: Diagnosis not present

## 2015-09-15 LAB — CBC WITH DIFFERENTIAL/PLATELET
BASOS PCT: 0 %
Basophils Absolute: 0 10*3/uL (ref 0.0–0.1)
EOS ABS: 0.1 10*3/uL (ref 0.0–0.7)
EOS PCT: 2 %
HCT: 25.1 % — ABNORMAL LOW (ref 39.0–52.0)
HEMOGLOBIN: 8.2 g/dL — AB (ref 13.0–17.0)
Lymphocytes Relative: 13 %
Lymphs Abs: 0.8 10*3/uL (ref 0.7–4.0)
MCH: 30.9 pg (ref 26.0–34.0)
MCHC: 32.7 g/dL (ref 30.0–36.0)
MCV: 94.7 fL (ref 78.0–100.0)
Monocytes Absolute: 0.6 10*3/uL (ref 0.1–1.0)
Monocytes Relative: 9 %
NEUTROS PCT: 76 %
Neutro Abs: 5 10*3/uL (ref 1.7–7.7)
PLATELETS: 184 10*3/uL (ref 150–400)
RBC: 2.65 MIL/uL — AB (ref 4.22–5.81)
RDW: 15.6 % — ABNORMAL HIGH (ref 11.5–15.5)
WBC: 6.6 10*3/uL (ref 4.0–10.5)

## 2015-09-15 LAB — COMPREHENSIVE METABOLIC PANEL
ALK PHOS: 54 U/L (ref 38–126)
ALT: 29 U/L (ref 17–63)
AST: 30 U/L (ref 15–41)
Albumin: 3.3 g/dL — ABNORMAL LOW (ref 3.5–5.0)
Anion gap: 9 (ref 5–15)
BILIRUBIN TOTAL: 0.4 mg/dL (ref 0.3–1.2)
BUN: 48 mg/dL — AB (ref 6–20)
CALCIUM: 8.7 mg/dL — AB (ref 8.9–10.3)
CO2: 26 mmol/L (ref 22–32)
CREATININE: 1.81 mg/dL — AB (ref 0.61–1.24)
Chloride: 105 mmol/L (ref 101–111)
GFR, EST AFRICAN AMERICAN: 38 mL/min — AB (ref >60)
GFR, EST NON AFRICAN AMERICAN: 33 mL/min — AB (ref >60)
Glucose, Bld: 117 mg/dL — ABNORMAL HIGH (ref 65–99)
Potassium: 4.3 mmol/L (ref 3.5–5.1)
Sodium: 140 mmol/L (ref 135–145)
TOTAL PROTEIN: 7.4 g/dL (ref 6.5–8.1)

## 2015-09-15 MED ORDER — CLINDAMYCIN HCL 150 MG PO CAPS: 300.0000 mg | ORAL_CAPSULE | Freq: Three times a day (TID) | ORAL | Status: DC

## 2015-09-15 MED ORDER — CLINDAMYCIN PHOSPHATE 900 MG/50ML IV SOLN
900.0000 mg | Freq: Once | INTRAVENOUS | Status: AC
  Administered 2015-09-15: 900 mg via INTRAVENOUS
  Filled 2015-09-15: qty 50

## 2015-09-15 NOTE — Discharge Instructions (Signed)

## 2015-09-15 NOTE — Telephone Encounter (Signed)
[name] at [facility] called to see if Andrew [name] could hold his Eliquis for 2 days. Due to pt having a u/s ablation (cyst drainage).  Will message Dr [name] for ok.   Per Dr [name] ok for Andrew [name] to come off of his Eliquis for two days. Called IR @ [facility] and left message for them. Called pt and LMTCB.
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
## 2015-09-15 NOTE — Telephone Encounter (Signed)
[facility] [phone]   She saw the pt and and the right leg look worse the left leg, swollen and redness

## 2015-09-15 NOTE — Telephone Encounter (Signed)
He needs to be seen
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
## 2015-09-16 ENCOUNTER — Other Ambulatory Visit: Payer: Self-pay | Admitting: Radiology

## 2015-09-16 NOTE — Telephone Encounter (Signed)
Called pt to try no schedule. Several rings no VM

## 2015-09-16 NOTE — Telephone Encounter (Signed)
Called nurse no answer LMOM with md response. Also tried calling pt no answer x's 10 rings...Andrew [name]

## 2015-09-17 ENCOUNTER — Encounter (HOSPITAL_COMMUNITY): Payer: Self-pay

## 2015-09-17 ENCOUNTER — Other Ambulatory Visit: Payer: Self-pay | Admitting: Urology

## 2015-09-17 ENCOUNTER — Ambulatory Visit (HOSPITAL_COMMUNITY)
Admission: RE | Admit: 2015-09-17 | Discharge: 2015-09-17 | Disposition: A | Discharge status: Home / Self Care | Payer: Medicare PPO | Source: Ambulatory Visit | Attending: Urology | Admitting: Urology

## 2015-09-17 ENCOUNTER — Telehealth: Payer: Self-pay | Admitting: Internal Medicine

## 2015-09-17 DIAGNOSIS — I4891 Unspecified atrial fibrillation: Secondary | ICD-10-CM | POA: Diagnosis not present

## 2015-09-17 DIAGNOSIS — Z9181 History of falling: Secondary | ICD-10-CM | POA: Diagnosis not present

## 2015-09-17 DIAGNOSIS — Z7982 Long term (current) use of aspirin: Secondary | ICD-10-CM | POA: Insufficient documentation

## 2015-09-17 DIAGNOSIS — Z7902 Long term (current) use of antithrombotics/antiplatelets: Secondary | ICD-10-CM | POA: Insufficient documentation

## 2015-09-17 DIAGNOSIS — I2510 Atherosclerotic heart disease of native coronary artery without angina pectoris: Secondary | ICD-10-CM | POA: Diagnosis not present

## 2015-09-17 DIAGNOSIS — R0609 Other forms of dyspnea: Secondary | ICD-10-CM | POA: Diagnosis not present

## 2015-09-17 DIAGNOSIS — Z79899 Other long term (current) drug therapy: Secondary | ICD-10-CM | POA: Insufficient documentation

## 2015-09-17 DIAGNOSIS — N182 Chronic kidney disease, stage 2 (mild): Secondary | ICD-10-CM | POA: Diagnosis not present

## 2015-09-17 DIAGNOSIS — D649 Anemia, unspecified: Secondary | ICD-10-CM | POA: Diagnosis not present

## 2015-09-17 DIAGNOSIS — I129 Hypertensive chronic kidney disease with stage 1 through stage 4 chronic kidney disease, or unspecified chronic kidney disease: Secondary | ICD-10-CM | POA: Diagnosis not present

## 2015-09-17 DIAGNOSIS — I5032 Chronic diastolic (congestive) heart failure: Secondary | ICD-10-CM | POA: Diagnosis not present

## 2015-09-17 DIAGNOSIS — N281 Cyst of kidney, acquired: Secondary | ICD-10-CM

## 2015-09-17 DIAGNOSIS — F329 Major depressive disorder, single episode, unspecified: Secondary | ICD-10-CM | POA: Diagnosis not present

## 2015-09-17 DIAGNOSIS — E1122 Type 2 diabetes mellitus with diabetic chronic kidney disease: Secondary | ICD-10-CM | POA: Diagnosis not present

## 2015-09-17 LAB — CBC WITH DIFFERENTIAL/PLATELET
Basophils Absolute: 0 10*3/uL (ref 0.0–0.1)
Basophils Relative: 0 %
Eosinophils Absolute: 0.2 10*3/uL (ref 0.0–0.7)
Eosinophils Relative: 3 %
HEMATOCRIT: 25.5 % — AB (ref 39.0–52.0)
HEMOGLOBIN: 8.1 g/dL — AB (ref 13.0–17.0)
LYMPHS ABS: 0.9 10*3/uL (ref 0.7–4.0)
LYMPHS PCT: 17 %
MCH: 29.8 pg (ref 26.0–34.0)
MCHC: 31.8 g/dL (ref 30.0–36.0)
MCV: 93.8 fL (ref 78.0–100.0)
MONO ABS: 0.6 10*3/uL (ref 0.1–1.0)
MONOS PCT: 11 %
NEUTROS ABS: 3.7 10*3/uL (ref 1.7–7.7)
NEUTROS PCT: 69 %
Platelets: 198 10*3/uL (ref 150–400)
RBC: 2.72 MIL/uL — ABNORMAL LOW (ref 4.22–5.81)
RDW: 15.6 % — AB (ref 11.5–15.5)
WBC: 5.4 10*3/uL (ref 4.0–10.5)

## 2015-09-17 LAB — GRAM STAIN

## 2015-09-17 LAB — PROTIME-INR
INR: 1.18 (ref 0.00–1.49)
Prothrombin Time: 15.2 seconds (ref 11.6–15.2)

## 2015-09-17 LAB — GLUCOSE, CAPILLARY: Glucose-Capillary: 95 mg/dL (ref 65–99)

## 2015-09-17 LAB — APTT: aPTT: 44 seconds — ABNORMAL HIGH (ref 24–37)

## 2015-09-17 MED ORDER — MIDAZOLAM HCL 2 MG/2ML IJ SOLN
INTRAMUSCULAR | Status: AC | PRN
  Administered 2015-09-17: 0.5 mg via INTRAVENOUS

## 2015-09-17 MED ORDER — CEFAZOLIN SODIUM-DEXTROSE 2-3 GM-% IV SOLR: 2.0000 g | Freq: Once | INTRAVENOUS | Status: DC

## 2015-09-17 MED ORDER — SODIUM CHLORIDE 0.9 % IV SOLN
INTRAVENOUS | Status: DC
  Administered 2015-09-17: 500 mL via INTRAVENOUS

## 2015-09-17 MED ORDER — FLUMAZENIL 0.5 MG/5ML IV SOLN
INTRAVENOUS | Status: AC
  Filled 2015-09-17: qty 5

## 2015-09-17 MED ORDER — FENTANYL CITRATE (PF) 100 MCG/2ML IJ SOLN
INTRAMUSCULAR | Status: AC
  Filled 2015-09-17: qty 2

## 2015-09-17 MED ORDER — NALOXONE HCL 0.4 MG/ML IJ SOLN
INTRAMUSCULAR | Status: AC
  Filled 2015-09-17: qty 1

## 2015-09-17 MED ORDER — MIDAZOLAM HCL 2 MG/2ML IJ SOLN
INTRAMUSCULAR | Status: AC
  Filled 2015-09-17: qty 2

## 2015-09-17 MED ORDER — FENTANYL CITRATE (PF) 100 MCG/2ML IJ SOLN
INTRAMUSCULAR | Status: AC | PRN
  Administered 2015-09-17: 25 ug via INTRAVENOUS

## 2015-09-17 NOTE — Telephone Encounter (Signed)
Returned call. Order given

## 2015-09-17 NOTE — Procedures (Signed)
US guided aspiration of dominant right renal cyst.  250 ml of cloudy brown/red fluid was removed.  No immediate complication.  Minimal blood loss.

## 2015-09-17 NOTE — Telephone Encounter (Signed)
Pt has return call & made f/u appt with Dr Linna Darner for tomorrow 09/18/15...Johny Chess
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
## 2015-09-17 NOTE — Discharge Instructions (Signed)
Fine Needle Aspiration °Fine needle aspiration is a procedure used to remove a piece of tissue. The tissue may be removed from a swelling or abnormal growth (tumor). It is also used to confirm a cyst. A cyst is a fluid filled sac. The procedure may be done by your caregiver or a specialist. °LET YOUR CAREGIVER KNOW ABOUT:  °· Any medications you take, especially blood thinners like aspirin. °· Any problems you may have had with similar procedures in the past. °RISKS AND COMPLICATIONS °This is a safe procedure. There is a very small risk of infection and or bleeding. Other complications can occur if the aspiration site is deep in the body. Your caregiver or specialist will explain this to you.  °BEFORE THE PROCEDURE  °· No special preparation is needed in most cases. Your caregiver will let you know if there are special requirements, such as an empty stomach. °· Be sure to ask your caregiver any questions before the procedure starts. °PROCEDURE  °· This procedure is done under local or no anesthetic. °· The skin is cleaned carefully. °· A thin needle is directed into a lump. The needle is directed in several different directions into the lump while suction is applied to the needle. When samples are removed, the needle is withdrawn. °· The contents obtained are placed on a slide. They are then fixed, stained and examined under the microscope. The slide is examined by a specialist in the examination of tissue (pathologist). °· A diagnosis can then be made. The pathologist will decide if the specimen is cancerous (malignant) or not cancerous (benign). If fluid is taken from a cyst, cells from the fluid can be examined. If no material is obtained from a fine needle aspiration, the sample may not rule out a problem. Sometimes the procedure is done again. The pathologist may need several days before a result is available. °AFTER THE PROCEDURE  °· There are usually no limits on diet or activity after a fine needle  aspiration. °· Your caregiver may give you instructions regarding the aspiration site. This will include information about keeping the site clean and dry. Follow these instructions carefully. °· Call for your test results as instructed by your caregiver. Remember, it is your responsibility to get the results of your testing. Do not think everything is fine if you have not heard from your caregiver. °· Keep a close watch on the aspiration site. Report any redness, swelling or drainage. °· You should not have much pain at the aspiration site. °· Take any medications as told by your caregiver. °SEEK MEDICAL CARE IF:  °· You have pain or drainage at the aspiration site that does not go away. °· Swelling at the aspiration site does not gradually go away. °SEEK IMMEDIATE MEDICAL CARE IF:  °· You develop a fever a day or two after the aspiration. °· You develop severe pain at the aspiration site. °· You develop a warm, tender swelling at the aspiration site. °Document Released: 12/09/2000 Document Revised: 03/05/2012 Document Reviewed: 08/22/2008 °[employer]® Patient Information ©2015 [employer]. This information is not intended to replace advice given to you by your health care provider. Make sure you discuss any questions you have with your health care provider. °Conscious Sedation, Adult, Care After °Refer to this sheet in the next few weeks. These instructions provide you with information on caring for yourself after your procedure. Your health care provider may also give you more specific instructions. Your treatment has been planned according to current medical   practices, but problems sometimes occur. Call your health care provider if you have any problems or questions after your procedure. °WHAT TO EXPECT AFTER THE PROCEDURE  °After your procedure: °· You may feel sleepy, clumsy, and have poor balance for several hours. °· Vomiting may occur if you eat too soon after the procedure. °HOME CARE INSTRUCTIONS °· Do not  participate in any activities where you could become injured for at least 24 hours. Do not: °¨ Drive. °¨ Swim. °¨ Ride a bicycle. °¨ Operate heavy machinery. °¨ Cook. °¨ Use power tools. °¨ Climb ladders. °¨ Work from a high place. °· Do not make important decisions or sign legal documents until you are improved. °· If you vomit, drink water, juice, or soup when you can drink without vomiting. Make sure you have little or no nausea before eating solid foods. °· Only take over-the-counter or prescription medicines for pain, discomfort, or fever as directed by your health care provider. °· Make sure you and your family fully understand everything about the medicines given to you, including what side effects may occur. °· You should not drink alcohol, take sleeping pills, or take medicines that cause drowsiness for at least 24 hours. °· If you smoke, do not smoke without supervision. °· If you are feeling better, you may resume normal activities 24 hours after you were sedated. °· Keep all appointments with your health care provider. °SEEK MEDICAL CARE IF: °· Your skin is pale or bluish in color. °· You continue to feel nauseous or vomit. °· Your pain is getting worse and is not helped by medicine. °· You have bleeding or swelling. °· You are still sleepy or feeling clumsy after 24 hours. °SEEK IMMEDIATE MEDICAL CARE IF: °· You develop a rash. °· You have difficulty breathing. °· You develop any type of allergic problem. °· You have a fever. °MAKE SURE YOU: °· Understand these instructions. °· Will watch your condition. °· Will get help right away if you are not doing well or get worse. °Document Released: 10/02/2013 Document Reviewed: 10/02/2013 °Exit[employer]® Patient Information ©2015 [employer]. This information is not intended to replace advice given to you by your health care provider. Make sure you discuss any questions you have with your health care provider. ° °

## 2015-09-17 NOTE — Telephone Encounter (Signed)
[facility] from [facility] called and states patient is having some cognitive issues and she is requesting orders for speech therapy and cognitive evaluation. She also states he seems confused about the home health and physical therapy and needs closer evaluation. She can be reached at [phone]
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
## 2015-09-18 ENCOUNTER — Encounter: Payer: Self-pay | Admitting: Internal Medicine

## 2015-09-18 ENCOUNTER — Ambulatory Visit (INDEPENDENT_AMBULATORY_CARE_PROVIDER_SITE_OTHER): Payer: Medicare PPO | Admitting: Internal Medicine

## 2015-09-18 VITALS — BP 136/60 | HR 91 | Temp 98.4°F | Resp 18 | Ht 66.0 in | Wt 189.0 lb

## 2015-09-18 DIAGNOSIS — L03119 Cellulitis of unspecified part of limb: Secondary | ICD-10-CM

## 2015-09-18 DIAGNOSIS — Q6102 Congenital multiple renal cysts: Secondary | ICD-10-CM

## 2015-09-18 DIAGNOSIS — N281 Cyst of kidney, acquired: Secondary | ICD-10-CM

## 2015-09-18 DIAGNOSIS — N183 Chronic kidney disease, stage 3 unspecified: Secondary | ICD-10-CM

## 2015-09-18 NOTE — Patient Instructions (Addendum)
Please take a probiotic , Florastor OR Align, every day if the bowels are loose. This will replace the normal bacteria which  are necessary for formation of normal stool and processing of food. Do not apply any medication to the shin scabs.
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
## 2015-09-18 NOTE — Progress Notes (Signed)
Pre visit review using our clinic review tool, if applicable. No additional management support is needed unless otherwise documented below in the visit note. 

## 2015-09-21 ENCOUNTER — Other Ambulatory Visit: Payer: Self-pay | Admitting: Internal Medicine

## 2015-09-22 ENCOUNTER — Encounter: Payer: Self-pay | Admitting: Podiatry

## 2015-09-22 ENCOUNTER — Ambulatory Visit (INDEPENDENT_AMBULATORY_CARE_PROVIDER_SITE_OTHER): Payer: Medicare PPO | Admitting: Podiatry

## 2015-09-22 DIAGNOSIS — N182 Chronic kidney disease, stage 2 (mild): Secondary | ICD-10-CM | POA: Diagnosis not present

## 2015-09-22 DIAGNOSIS — M79676 Pain in unspecified toe(s): Secondary | ICD-10-CM

## 2015-09-22 DIAGNOSIS — I4891 Unspecified atrial fibrillation: Secondary | ICD-10-CM | POA: Diagnosis not present

## 2015-09-22 DIAGNOSIS — D649 Anemia, unspecified: Secondary | ICD-10-CM | POA: Diagnosis not present

## 2015-09-22 DIAGNOSIS — I2510 Atherosclerotic heart disease of native coronary artery without angina pectoris: Secondary | ICD-10-CM | POA: Diagnosis not present

## 2015-09-22 DIAGNOSIS — B351 Tinea unguium: Secondary | ICD-10-CM | POA: Diagnosis not present

## 2015-09-22 DIAGNOSIS — Z9181 History of falling: Secondary | ICD-10-CM | POA: Diagnosis not present

## 2015-09-22 DIAGNOSIS — I5032 Chronic diastolic (congestive) heart failure: Secondary | ICD-10-CM | POA: Diagnosis not present

## 2015-09-22 DIAGNOSIS — I129 Hypertensive chronic kidney disease with stage 1 through stage 4 chronic kidney disease, or unspecified chronic kidney disease: Secondary | ICD-10-CM | POA: Diagnosis not present

## 2015-09-22 DIAGNOSIS — F329 Major depressive disorder, single episode, unspecified: Secondary | ICD-10-CM | POA: Diagnosis not present

## 2015-09-22 DIAGNOSIS — E1122 Type 2 diabetes mellitus with diabetic chronic kidney disease: Secondary | ICD-10-CM | POA: Diagnosis not present

## 2015-09-22 LAB — CULTURE, BODY FLUID W GRAM STAIN -BOTTLE

## 2015-09-22 LAB — CULTURE, BODY FLUID-BOTTLE: CULTURE: NO GROWTH

## 2015-09-22 NOTE — Progress Notes (Signed)
Patient ID: Andrew [name], male   DOB: November 04, 1928, 81 y.o.   MRN: [id number] Complaint:  Visit Type: Patient returns to my office for continued preventative foot care services. Complaint: Patient states" my nails have grown long and thick and become painful to walk and wear shoes" Patient has been diagnosed with DM with no foot complications. The patient presents for preventative foot care services. No changes to ROS  Podiatric Exam: Vascular: dorsalis pedis and posterior tibial pulses are palpable bilateral. Capillary return is immediate. Temperature gradient is WNL. Skin turgor WNL  Sensorium: Normal Semmes Weinstein monofilament test. Normal tactile sensation bilaterally. Nail Exam: Pt has thick disfigured discolored nails with subungual debris noted bilateral entire nail hallux through fifth toenails Ulcer Exam: There is no evidence of ulcer or pre-ulcerative changes or infection. Orthopedic Exam: Muscle tone and strength are WNL. No limitations in general ROM. No crepitus or effusions noted. Foot type and digits show no abnormalities. Bony prominences are unremarkable. 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 Skin: No Porokeratosis. No infection or ulcers  Diagnosis:  Onychomycosis, , Pain in right toe, pain in left toes  Treatment & Plan Procedures and Treatment: Consent by patient was obtained for treatment procedures. The patient understood the discussion of treatment and procedures well. All questions were answered thoroughly reviewed. Debridement of mycotic and hypertrophic toenails, 1 through 5 bilateral and clearing of subungual debris. No ulceration, no infection noted.  Return Visit-Office Procedure: Patient instructed to return to the office for a follow up visit 3 months for continued evaluation and treatment.

## 2015-09-23 ENCOUNTER — Ambulatory Visit: Payer: Medicare PPO | Admitting: Internal Medicine

## 2015-09-24 DIAGNOSIS — E1122 Type 2 diabetes mellitus with diabetic chronic kidney disease: Secondary | ICD-10-CM | POA: Diagnosis not present

## 2015-09-24 DIAGNOSIS — I2510 Atherosclerotic heart disease of native coronary artery without angina pectoris: Secondary | ICD-10-CM | POA: Diagnosis not present

## 2015-09-24 DIAGNOSIS — Z9181 History of falling: Secondary | ICD-10-CM | POA: Diagnosis not present

## 2015-09-24 DIAGNOSIS — F329 Major depressive disorder, single episode, unspecified: Secondary | ICD-10-CM | POA: Diagnosis not present

## 2015-09-24 DIAGNOSIS — I129 Hypertensive chronic kidney disease with stage 1 through stage 4 chronic kidney disease, or unspecified chronic kidney disease: Secondary | ICD-10-CM | POA: Diagnosis not present

## 2015-09-24 DIAGNOSIS — I4891 Unspecified atrial fibrillation: Secondary | ICD-10-CM | POA: Diagnosis not present

## 2015-09-24 DIAGNOSIS — I5032 Chronic diastolic (congestive) heart failure: Secondary | ICD-10-CM | POA: Diagnosis not present

## 2015-09-24 DIAGNOSIS — D649 Anemia, unspecified: Secondary | ICD-10-CM | POA: Diagnosis not present

## 2015-09-24 DIAGNOSIS — N182 Chronic kidney disease, stage 2 (mild): Secondary | ICD-10-CM | POA: Diagnosis not present

## 2015-09-25 ENCOUNTER — Encounter: Payer: Self-pay | Admitting: Internal Medicine

## 2015-09-25 ENCOUNTER — Ambulatory Visit (INDEPENDENT_AMBULATORY_CARE_PROVIDER_SITE_OTHER): Payer: Medicare PPO | Admitting: Internal Medicine

## 2015-09-25 VITALS — BP 136/84 | HR 80 | Temp 97.9°F | Resp 16 | Wt 185.0 lb

## 2015-09-25 DIAGNOSIS — S5011XS Contusion of right forearm, sequela: Secondary | ICD-10-CM

## 2015-09-25 DIAGNOSIS — M25561 Pain in right knee: Secondary | ICD-10-CM | POA: Diagnosis not present

## 2015-09-25 DIAGNOSIS — M2550 Pain in unspecified joint: Secondary | ICD-10-CM | POA: Diagnosis not present

## 2015-09-25 MED ORDER — TRAMADOL HCL 50 MG PO TABS: 50.0000 mg | ORAL_TABLET | Freq: Two times a day (BID) | ORAL | Status: DC | PRN
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
## 2015-09-25 NOTE — Patient Instructions (Addendum)
Use warm moist compresses 3- 4 times a day to the right forearm.  You do not need additional antibiotic; complete the present supply

## 2015-09-25 NOTE — Progress Notes (Signed)
Pre visit review using our clinic review tool, if applicable. No additional management support is needed unless otherwise documented below in the visit note. 

## 2015-09-28 DIAGNOSIS — Z9181 History of falling: Secondary | ICD-10-CM | POA: Diagnosis not present

## 2015-09-28 DIAGNOSIS — I5032 Chronic diastolic (congestive) heart failure: Secondary | ICD-10-CM | POA: Diagnosis not present

## 2015-09-28 DIAGNOSIS — D649 Anemia, unspecified: Secondary | ICD-10-CM | POA: Diagnosis not present

## 2015-09-28 DIAGNOSIS — N182 Chronic kidney disease, stage 2 (mild): Secondary | ICD-10-CM | POA: Diagnosis not present

## 2015-09-28 DIAGNOSIS — E1122 Type 2 diabetes mellitus with diabetic chronic kidney disease: Secondary | ICD-10-CM | POA: Diagnosis not present

## 2015-09-28 DIAGNOSIS — I4891 Unspecified atrial fibrillation: Secondary | ICD-10-CM | POA: Diagnosis not present

## 2015-09-28 DIAGNOSIS — I2510 Atherosclerotic heart disease of native coronary artery without angina pectoris: Secondary | ICD-10-CM | POA: Diagnosis not present

## 2015-09-28 DIAGNOSIS — F329 Major depressive disorder, single episode, unspecified: Secondary | ICD-10-CM | POA: Diagnosis not present

## 2015-09-28 DIAGNOSIS — I129 Hypertensive chronic kidney disease with stage 1 through stage 4 chronic kidney disease, or unspecified chronic kidney disease: Secondary | ICD-10-CM | POA: Diagnosis not present

## 2015-09-29 DIAGNOSIS — E1122 Type 2 diabetes mellitus with diabetic chronic kidney disease: Secondary | ICD-10-CM | POA: Diagnosis not present

## 2015-09-29 DIAGNOSIS — I2510 Atherosclerotic heart disease of native coronary artery without angina pectoris: Secondary | ICD-10-CM | POA: Diagnosis not present

## 2015-09-29 DIAGNOSIS — N182 Chronic kidney disease, stage 2 (mild): Secondary | ICD-10-CM | POA: Diagnosis not present

## 2015-09-29 DIAGNOSIS — D649 Anemia, unspecified: Secondary | ICD-10-CM | POA: Diagnosis not present

## 2015-09-29 DIAGNOSIS — I4891 Unspecified atrial fibrillation: Secondary | ICD-10-CM | POA: Diagnosis not present

## 2015-09-29 DIAGNOSIS — Z9181 History of falling: Secondary | ICD-10-CM | POA: Diagnosis not present

## 2015-09-29 DIAGNOSIS — I129 Hypertensive chronic kidney disease with stage 1 through stage 4 chronic kidney disease, or unspecified chronic kidney disease: Secondary | ICD-10-CM | POA: Diagnosis not present

## 2015-09-29 DIAGNOSIS — I5032 Chronic diastolic (congestive) heart failure: Secondary | ICD-10-CM | POA: Diagnosis not present

## 2015-09-29 DIAGNOSIS — F329 Major depressive disorder, single episode, unspecified: Secondary | ICD-10-CM | POA: Diagnosis not present

## 2015-09-30 DIAGNOSIS — I2510 Atherosclerotic heart disease of native coronary artery without angina pectoris: Secondary | ICD-10-CM | POA: Diagnosis not present

## 2015-09-30 DIAGNOSIS — I129 Hypertensive chronic kidney disease with stage 1 through stage 4 chronic kidney disease, or unspecified chronic kidney disease: Secondary | ICD-10-CM | POA: Diagnosis not present

## 2015-09-30 DIAGNOSIS — D649 Anemia, unspecified: Secondary | ICD-10-CM | POA: Diagnosis not present

## 2015-09-30 DIAGNOSIS — Z9181 History of falling: Secondary | ICD-10-CM | POA: Diagnosis not present

## 2015-09-30 DIAGNOSIS — I5032 Chronic diastolic (congestive) heart failure: Secondary | ICD-10-CM | POA: Diagnosis not present

## 2015-09-30 DIAGNOSIS — F329 Major depressive disorder, single episode, unspecified: Secondary | ICD-10-CM | POA: Diagnosis not present

## 2015-09-30 DIAGNOSIS — I4891 Unspecified atrial fibrillation: Secondary | ICD-10-CM | POA: Diagnosis not present

## 2015-09-30 DIAGNOSIS — E1122 Type 2 diabetes mellitus with diabetic chronic kidney disease: Secondary | ICD-10-CM | POA: Diagnosis not present

## 2015-09-30 DIAGNOSIS — N182 Chronic kidney disease, stage 2 (mild): Secondary | ICD-10-CM | POA: Diagnosis not present

## 2015-10-01 DIAGNOSIS — I4891 Unspecified atrial fibrillation: Secondary | ICD-10-CM | POA: Diagnosis not present

## 2015-10-01 DIAGNOSIS — I129 Hypertensive chronic kidney disease with stage 1 through stage 4 chronic kidney disease, or unspecified chronic kidney disease: Secondary | ICD-10-CM | POA: Diagnosis not present

## 2015-10-01 DIAGNOSIS — E1122 Type 2 diabetes mellitus with diabetic chronic kidney disease: Secondary | ICD-10-CM | POA: Diagnosis not present

## 2015-10-01 DIAGNOSIS — D649 Anemia, unspecified: Secondary | ICD-10-CM | POA: Diagnosis not present

## 2015-10-01 DIAGNOSIS — N182 Chronic kidney disease, stage 2 (mild): Secondary | ICD-10-CM | POA: Diagnosis not present

## 2015-10-01 DIAGNOSIS — Z9181 History of falling: Secondary | ICD-10-CM | POA: Diagnosis not present

## 2015-10-01 DIAGNOSIS — I5032 Chronic diastolic (congestive) heart failure: Secondary | ICD-10-CM | POA: Diagnosis not present

## 2015-10-01 DIAGNOSIS — I2510 Atherosclerotic heart disease of native coronary artery without angina pectoris: Secondary | ICD-10-CM | POA: Diagnosis not present

## 2015-10-01 DIAGNOSIS — F329 Major depressive disorder, single episode, unspecified: Secondary | ICD-10-CM | POA: Diagnosis not present

## 2015-10-02 ENCOUNTER — Encounter: Payer: Self-pay | Admitting: Neurology

## 2015-10-02 ENCOUNTER — Other Ambulatory Visit (INDEPENDENT_AMBULATORY_CARE_PROVIDER_SITE_OTHER): Payer: Medicare PPO

## 2015-10-02 ENCOUNTER — Ambulatory Visit (INDEPENDENT_AMBULATORY_CARE_PROVIDER_SITE_OTHER): Payer: Medicare PPO | Admitting: Neurology

## 2015-10-02 VITALS — BP 104/80 | HR 70 | Ht 66.0 in | Wt 176.6 lb

## 2015-10-02 DIAGNOSIS — G609 Hereditary and idiopathic neuropathy, unspecified: Secondary | ICD-10-CM

## 2015-10-02 DIAGNOSIS — R270 Ataxia, unspecified: Secondary | ICD-10-CM | POA: Diagnosis not present
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
## 2015-10-02 NOTE — Progress Notes (Addendum)
NEUROLOGY CONSULTATION NOTE  RIKY DEGRAY MRN: [id number] DOB: 1928/12/07  Referring provider: Dr. Ronnald Ramp Primary care provider: Dr. Ronnald Ramp  Reason for consult:  ataxia  HISTORY OF PRESENT ILLNESS: Andrew [name] is an 81 year old right-handed male with paroxysmal atrial fibrillation (on anticoagulation), peripheral vascular disease, carotid artery stenosis, hypertension, CKD stage 3, hyperlipidemia, CHF, who presents for ataxia.  History obtained by patient and PCP note.  Images of brain MRI from 2014 reviewed.  Labs reviewed.  He has had multiple medical issues over the summer.  In May, he developed acute back pain that required injections in the back.  Then, he developed CHF exacerbation while on a cruise in the [place].  He was hospitalized in [place] for 2 weeks.  When he got home, he became ill and was found to have a UTI.  He then developed cellulitis on his legs.  After recovering from the UTI, he suddenly noted problems with balance.  While walking, he feels pretty good.  If he stands still, he feels his body swaying.  He denies dizziness, leg weakness or inability feeling the ground.  He denies neck pain or urinary incontinence.  He no longer has back pain.  He had two falls.  One fall occurred while he was outside in the yard trimming the hedges.  He turned and lost his balance.  He had another fall, in which he lost his balance when turning his body.  He currently receives home PT, which has helped.  In hindsight, he has had mild balance problems in the past, but never as pronounced as now.    His most recent head imaging was an MRI of the brain without contrast performed on 05/22/13 to evaluate dizziness.  It revealed generalized atrophy and mild to moderate chronic small vessel ischemic changes in the white matter.  Labs from last month include CBC which revealed chronic anemia, CMP with chronic elevated BUN and Cr of 48 and 1.81.  TSH was found to be mildly elevated at  4.761.  He has right carotid artery disease with 40-59% stenosis, which is monitored annually.  PAST MEDICAL HISTORY: Past Medical History  Diagnosis Date  . CAD (coronary artery disease)   . LVF (left ventricular failure) ([place])   . Carotid artery stenosis   . Hyperlipidemia   . Hypertension   . GERD (gastroesophageal reflux disease)   . IBS (irritable bowel syndrome)   . Arthritis   . Diabetes mellitus   . Thyroid disease   . Anemia   . Vitamin D deficiency   . Depression   . Fatty liver 2008  . Cancer ([place])     Melanoma - Back  . AAA (abdominal aortic aneurysm) ([place])   . Pneumonia Jan. 2014  . CHF (congestive heart failure) ([place])   . DVT (deep venous thrombosis) ([place])   . Paroxysmal atrial fibrillation ([facility])   . Typical atrial flutter ([place])     PAST SURGICAL HISTORY: Past Surgical History  Procedure Laterality Date  . Total knee arthroplasty      bilateral- Pt fell 3 steps  . Cardiac stents  2005  . Coronary artery bypass graft  1992  . Joint replacement      Bilateral knee arthroplasty  . Eye surgery      [place]  . Abdominal aortic aneurysm repair  2002  . Cardiac catheterization    . Left heart catheterization with coronary angiogram N/A 10/30/2014    Procedure: LEFT HEART CATHETERIZATION WITH CORONARY ANGIOGRAM;  Surgeon: Larey Dresser, MD;  Location: [facility] CATH [facility];  Service: Cardiovascular;  Laterality: N/A;  . Uti  June 2016    While in [place]  2016    MEDICATIONS: Current Outpatient Prescriptions on File Prior to Visit  Medication Sig Dispense Refill  . acetaminophen (TYLENOL) 500 MG tablet Take 1,000 mg by mouth every 8 (eight) hours as needed for moderate pain.     Marland Kitchen ALPRAZolam (XANAX) 0.5 MG tablet Take 0.25-0.5 mg by mouth 2 (two) times daily as needed for anxiety or sleep.     Marland Kitchen amiodarone (PACERONE) 200 MG tablet Take 1 tablet (200 mg total) by mouth daily.    Marland Kitchen apixaban (ELIQUIS) 2.5 MG TABS tablet Take 1 tablet (2.5 mg total) by mouth 2 (two)  times daily. 60 tablet 3  . aspirin EC 81 MG tablet Take 1 tablet (81 mg total) by mouth daily.    Marland Kitchen atorvastatin (LIPITOR) 80 MG tablet TAKE 1 TABLET ONCE DAILY. 30 tablet 0  . cilostazol (PLETAL) 100 MG tablet Take 100 mg by mouth 2 (two) times daily.    . clindamycin (CLEOCIN) 150 MG capsule Take 2 capsules (300 mg total) by mouth 3 (three) times daily. May dispense as 150mg  capsules 60 capsule 0  . Coenzyme Q10 (CO Q 10 PO) Take 1 tablet by mouth daily.    Marland Kitchen erythromycin with ethanol (THERAMYCIN) 2 % external solution Apply 1 application topically daily as needed (for skin irritation around neck).    . ezetimibe (ZETIA) 10 MG tablet Take 10 mg by mouth daily.    Marland Kitchen FLUoxetine (PROZAC) 20 MG capsule Take 1 capsule (20 mg total) by mouth daily. 90 capsule 3  . furosemide (LASIX) 40 MG tablet Take 2 tabs in AM and 1 (Patient taking differently: Take 40-60 mg by mouth 2 (two) times daily. Take 2 tabs in AM and 1.5 tab in the evening) 90 tablet 0  . glucosamine-chondroitin 500-400 MG tablet Take 1 tablet by mouth 2 (two) times daily.     . Hypromellose (GENTEAL MILD) 0.2 % SOLN Place 1 drop into both eyes daily as needed (for dry eyes).     Marland Kitchen losartan (COZAAR) 100 MG tablet Take 100 mg by mouth daily.    . Misc Natural Products (NF FORMULAS TESTOSTERONE) CAPS Take 1 capsule by mouth daily.     . Multiple Vitamin (MULTIVITAMIN) tablet Take 1 tablet by mouth daily.    . mupirocin cream (BACTROBAN) 2 % Apply 1 application topically 2 (two) times daily. 30 g 1  . NIFEdipine (PROCARDIA-XL/ADALAT-CC/NIFEDICAL-XL) 30 MG 24 hr tablet Take 30 mg by mouth daily.    Marland Kitchen NITROSTAT 0.4 MG SL tablet DISSOLVE 1 TABLET UNDER TONGUE AS NEEDED FOR CHEST PAIN,MAY REPEAT IN5 MINUTES FOR 2 DOSES. 25 tablet 3  . omega-3 acid ethyl esters (LOVAZA) 1 G capsule Take 1 g by mouth 2 (two) times daily.    . polyethylene glycol (MIRALAX / GLYCOLAX) packet Take 17 g by mouth daily as needed (constipation).    . potassium chloride  SA (K-DUR,KLOR-CON) 20 MEQ tablet TAKE 1 TABLET ONCE DAILY. 30 tablet 11  . tamsulosin (FLOMAX) 0.4 MG CAPS capsule Take 0.4 mg by mouth daily.    . temazepam (RESTORIL) 30 MG capsule TAKE 1 CAPSULE AT BEDTIME AS NEEDED FOR SLEEP. 30 capsule 3  . traMADol (ULTRAM) 50 MG tablet Take 1 tablet (50 mg total) by mouth every 12 (twelve) hours as needed. 30 tablet 0   No current  facility-administered medications on file prior to visit.    ALLERGIES: Allergies  Allergen Reactions  . Metoprolol Shortness Of Breath and Palpitations    Heart starts racing. Shallow breathing   . Metformin And Related Nausea And Vomiting    FAMILY HISTORY: Family History  Problem Relation Age of Onset  . Colon cancer Father   . Cancer Father   . Coronary artery disease Brother   . Diabetes Brother   . Cancer Brother     throat and abdominal  . Heart disease Brother   . Hyperlipidemia Brother   . Peripheral vascular disease Brother     Varicose Veins  . Diabetes Son   . Hyperlipidemia Son     SOCIAL HISTORY: Social History   Social History  . Marital Status: Married    Spouse Name: N/A  . Number of Children: N/A  . Years of Education: N/A   Occupational History  . Retired    Social History Main Topics  . 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 Smokeless tobacco: Never Used     Comment: quit atleast 25 yrs ago, per pt  . Alcohol Use: 1.8 oz/week    3 Shots of liquor per week     Comment: socially  . Drug Use: No  . Sexual Activity: Yes   Other Topics Concern  . Not on file   Social History Narrative   Pt lives in [place] with spouse.   Retired from [employer].  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    REVIEW OF SYSTEMS: Constitutional: No fevers, chills, or sweats, no generalized fatigue, change in appetite Eyes: No visual changes, double vision, eye pain Ear, nose and throat: No hearing loss, ear pain, nasal congestion, sore  throat Cardiovascular: No chest pain, palpitations Respiratory:  No shortness of breath at rest or with exertion, wheezes GastrointestinaI: No nausea, vomiting, diarrhea, abdominal pain, fecal incontinence Genitourinary:  No dysuria, urinary retention or frequency Musculoskeletal:  No neck pain, back pain Integumentary: healing cellulitis on legs Neurological: as above Psychiatric: No depression, insomnia, anxiety Endocrine: No palpitations, fatigue, diaphoresis, mood swings, change in appetite, change in weight, increased thirst Hematologic/Lymphatic:  No anemia, purpura, petechiae. Allergic/Immunologic: no itchy/runny eyes, nasal congestion, recent allergic reactions, rashes  PHYSICAL EXAM: Filed Vitals:   10/02/15 1246  BP: 104/80  Pulse: 70   General: No acute distress.  Patient appears well-groomed.  Head:  Normocephalic/atraumatic Eyes:  fundi unremarkable, without vessel changes, exudates, hemorrhages or papilledema. Neck: supple, no paraspinal tenderness, full range of motion Back: No paraspinal tenderness Heart: regular rate and rhythm Lungs: Clear to auscultation bilaterally. Vascular: No carotid bruits. Neurological Exam: Mental status: alert and oriented to person, place, and time, recent and remote memory intact, fund of knowledge intact, attention and concentration intact, speech fluent and not dysarthric, language intact. Cranial nerves: CN I: not tested CN II: pupils equal, round and reactive to light, visual fields intact, fundi unremarkable, without vessel changes, exudates, hemorrhages or papilledema. CN III, IV, VI:  full range of motion, no nystagmus, no ptosis CN V: facial sensation intact CN VII: upper and lower face symmetric CN VIII: hearing intact CN IX, X: gag intact, uvula midline CN XI: sternocleidomastoid and trapezius muscles intact CN XII: tongue midline Bulk & Tone: normal, no fasciculations. Motor:  5/5 throughout  Sensation:  Decreased  pinprick sensation in feet with significant decreased vibration sensation up to the knees. Deep Tendon Reflexes:  Trace in upper extremities, absent in  lower extremities, toes downgoing.  Finger to nose testing:  Postural and kinetic tremor but without dysmetria.   Heel to shin:  Without dysmetria.  Gait:  Mildly wide-based gait with some stumbling and mild veering.  Able to turn.  Unable to tandem walk.  Romberg with sway.  IMPRESSION & PLAN: Ataxia.  Probably secondary to an idiopathic peripheral neuropathy.    He does endorse some balance problems in the past, however it may have become more pronounced due to some deconditioning related to his recent illnesses.  Nevertheless, since he says this gait instability is so sudden onset, we will check MRI of the brain to look for any intracranial explanation for symptoms.  He does not exhibit symptoms of myelopathy.  We will also check B12 level.  I also advised him to use a cane since he is a fall risk on anticoagulation.  Thank you for allowing me to take part in the care of this patient.  Metta Clines, DO  CC:  [name], MD

## 2015-10-02 NOTE — Patient Instructions (Addendum)
I think the balance problems are due to neuropathy in your feet.  However, since the balance problems were so sudden, we will check MRI of the brain without contrast.  We will also check a vitamin B12 level, which may be a cause for neuropathy and balance problems.  We will contact you with results.  Your MRI is scheduled at [facility] on 1st floor on 10/16/2015 at 5pm arrive at 4:45pm

## 2015-10-05 ENCOUNTER — Other Ambulatory Visit: Payer: Self-pay | Admitting: Cardiology

## 2015-10-05 DIAGNOSIS — F329 Major depressive disorder, single episode, unspecified: Secondary | ICD-10-CM | POA: Diagnosis not present

## 2015-10-05 DIAGNOSIS — I129 Hypertensive chronic kidney disease with stage 1 through stage 4 chronic kidney disease, or unspecified chronic kidney disease: Secondary | ICD-10-CM | POA: Diagnosis not present

## 2015-10-05 DIAGNOSIS — Z9181 History of falling: Secondary | ICD-10-CM | POA: Diagnosis not present

## 2015-10-05 DIAGNOSIS — I5032 Chronic diastolic (congestive) heart failure: Secondary | ICD-10-CM | POA: Diagnosis not present

## 2015-10-05 DIAGNOSIS — D649 Anemia, unspecified: Secondary | ICD-10-CM | POA: Diagnosis not present

## 2015-10-05 DIAGNOSIS — I4891 Unspecified atrial fibrillation: Secondary | ICD-10-CM | POA: Diagnosis not present

## 2015-10-05 DIAGNOSIS — N182 Chronic kidney disease, stage 2 (mild): Secondary | ICD-10-CM | POA: Diagnosis not present

## 2015-10-05 DIAGNOSIS — I2510 Atherosclerotic heart disease of native coronary artery without angina pectoris: Secondary | ICD-10-CM | POA: Diagnosis not present

## 2015-10-05 DIAGNOSIS — E1122 Type 2 diabetes mellitus with diabetic chronic kidney disease: Secondary | ICD-10-CM | POA: Diagnosis not present

## 2015-10-05 LAB — VITAMIN B12: Vitamin B-12: 1086 pg/mL — ABNORMAL HIGH (ref 211–911)

## 2015-10-06 ENCOUNTER — Encounter (HOSPITAL_COMMUNITY): Payer: Medicare PPO

## 2015-10-06 ENCOUNTER — Other Ambulatory Visit (HOSPITAL_COMMUNITY): Payer: Medicare PPO

## 2015-10-06 ENCOUNTER — Ambulatory Visit: Payer: Medicare PPO | Admitting: Family

## 2015-10-06 DIAGNOSIS — F329 Major depressive disorder, single episode, unspecified: Secondary | ICD-10-CM | POA: Diagnosis not present

## 2015-10-06 DIAGNOSIS — Z9181 History of falling: Secondary | ICD-10-CM | POA: Diagnosis not present

## 2015-10-06 DIAGNOSIS — N182 Chronic kidney disease, stage 2 (mild): Secondary | ICD-10-CM | POA: Diagnosis not present

## 2015-10-06 DIAGNOSIS — I2510 Atherosclerotic heart disease of native coronary artery without angina pectoris: Secondary | ICD-10-CM | POA: Diagnosis not present

## 2015-10-06 DIAGNOSIS — I129 Hypertensive chronic kidney disease with stage 1 through stage 4 chronic kidney disease, or unspecified chronic kidney disease: Secondary | ICD-10-CM | POA: Diagnosis not present

## 2015-10-06 DIAGNOSIS — D649 Anemia, unspecified: Secondary | ICD-10-CM | POA: Diagnosis not present

## 2015-10-06 DIAGNOSIS — E1122 Type 2 diabetes mellitus with diabetic chronic kidney disease: Secondary | ICD-10-CM | POA: Diagnosis not present

## 2015-10-06 DIAGNOSIS — I5032 Chronic diastolic (congestive) heart failure: Secondary | ICD-10-CM | POA: Diagnosis not present

## 2015-10-06 DIAGNOSIS — I4891 Unspecified atrial fibrillation: Secondary | ICD-10-CM | POA: Diagnosis not present

## 2015-10-12 ENCOUNTER — Ambulatory Visit (HOSPITAL_COMMUNITY)
Admission: RE | Admit: 2015-10-12 | Discharge: 2015-10-12 | Disposition: A | Discharge status: Home / Self Care | Payer: Medicare PPO | Source: Ambulatory Visit | Attending: Cardiology | Admitting: Cardiology

## 2015-10-12 VITALS — BP 124/58 | HR 71 | Wt 180.0 lb

## 2015-10-12 DIAGNOSIS — N189 Chronic kidney disease, unspecified: Secondary | ICD-10-CM | POA: Diagnosis not present

## 2015-10-12 DIAGNOSIS — N183 Chronic kidney disease, stage 3 unspecified: Secondary | ICD-10-CM

## 2015-10-12 DIAGNOSIS — E1122 Type 2 diabetes mellitus with diabetic chronic kidney disease: Secondary | ICD-10-CM | POA: Insufficient documentation

## 2015-10-12 DIAGNOSIS — I6523 Occlusion and stenosis of bilateral carotid arteries: Secondary | ICD-10-CM | POA: Diagnosis not present

## 2015-10-12 DIAGNOSIS — I4891 Unspecified atrial fibrillation: Secondary | ICD-10-CM | POA: Insufficient documentation

## 2015-10-12 DIAGNOSIS — J449 Chronic obstructive pulmonary disease, unspecified: Secondary | ICD-10-CM | POA: Diagnosis not present

## 2015-10-12 DIAGNOSIS — I255 Ischemic cardiomyopathy: Secondary | ICD-10-CM | POA: Diagnosis not present

## 2015-10-12 DIAGNOSIS — G609 Hereditary and idiopathic neuropathy, unspecified: Secondary | ICD-10-CM | POA: Insufficient documentation

## 2015-10-12 DIAGNOSIS — Z7902 Long term (current) use of antithrombotics/antiplatelets: Secondary | ICD-10-CM | POA: Diagnosis not present

## 2015-10-12 DIAGNOSIS — Z8249 Family history of ischemic heart disease and other diseases of the circulatory system: Secondary | ICD-10-CM | POA: Insufficient documentation

## 2015-10-12 DIAGNOSIS — E785 Hyperlipidemia, unspecified: Secondary | ICD-10-CM | POA: Diagnosis not present

## 2015-10-12 DIAGNOSIS — I480 Paroxysmal atrial fibrillation: Secondary | ICD-10-CM

## 2015-10-12 DIAGNOSIS — K219 Gastro-esophageal reflux disease without esophagitis: Secondary | ICD-10-CM | POA: Diagnosis not present

## 2015-10-12 DIAGNOSIS — I129 Hypertensive chronic kidney disease with stage 1 through stage 4 chronic kidney disease, or unspecified chronic kidney disease: Secondary | ICD-10-CM | POA: Diagnosis not present

## 2015-10-12 DIAGNOSIS — I4892 Unspecified atrial flutter: Secondary | ICD-10-CM

## 2015-10-12 DIAGNOSIS — I5032 Chronic diastolic (congestive) heart failure: Secondary | ICD-10-CM | POA: Diagnosis not present

## 2015-10-12 DIAGNOSIS — Z87891 Personal history of nicotine dependence: Secondary | ICD-10-CM | POA: Insufficient documentation

## 2015-10-12 DIAGNOSIS — Z79899 Other long term (current) drug therapy: Secondary | ICD-10-CM | POA: Diagnosis not present

## 2015-10-12 DIAGNOSIS — I2510 Atherosclerotic heart disease of native coronary artery without angina pectoris: Secondary | ICD-10-CM | POA: Diagnosis not present

## 2015-10-12 DIAGNOSIS — I739 Peripheral vascular disease, unspecified: Secondary | ICD-10-CM | POA: Insufficient documentation

## 2015-10-12 DIAGNOSIS — Z951 Presence of aortocoronary bypass graft: Secondary | ICD-10-CM | POA: Diagnosis not present

## 2015-10-12 LAB — BASIC METABOLIC PANEL
Anion gap: 7 (ref 5–15)
BUN: 40 mg/dL — ABNORMAL HIGH (ref 6–20)
CALCIUM: 9.5 mg/dL (ref 8.9–10.3)
CO2: 27 mmol/L (ref 22–32)
Chloride: 107 mmol/L (ref 101–111)
Creatinine, Ser: 1.79 mg/dL — ABNORMAL HIGH (ref 0.61–1.24)
GFR calc Af Amer: 38 mL/min — ABNORMAL LOW (ref >60)
GFR calc non Af Amer: 33 mL/min — ABNORMAL LOW (ref >60)
GLUCOSE: 113 mg/dL — AB (ref 65–99)
Potassium: 4.5 mmol/L (ref 3.5–5.1)
Sodium: 141 mmol/L (ref 135–145)

## 2015-10-12 LAB — BRAIN NATRIURETIC PEPTIDE: B Natriuretic Peptide: 376.3 pg/mL — ABNORMAL HIGH (ref 0.0–100.0)

## 2015-10-12 LAB — TSH: TSH: 5.086 u[IU]/mL — AB (ref 0.350–4.500)

## 2015-10-12 LAB — T4, FREE: Free T4: 1.05 ng/dL (ref 0.61–1.12)

## 2015-10-12 NOTE — Patient Instructions (Signed)
Continue current medications  Labs today will call if abnormal   Follow up in 2 months  Do the following things EVERYDAY: 1. Weigh yourself in the morning before breakfast. Write it down and keep it in a log. 2. Take your medicines as prescribed 3. Eat low salt foods-Limit salt (sodium) to 2000 mg per day.  4. Stay as active as you can everyday 5. Limit all fluids for the day to less than 2 liters

## 2015-10-12 NOTE — Progress Notes (Signed)
Advanced Heart Failure Medication Review by a Pharmacist  Does the patient  feel that his/her medications are working for him/her?  yes  Has the patient been experiencing any side effects to the medications prescribed?  no  Does the patient measure his/her own blood pressure or blood glucose at home?  yes   Does the patient have any problems obtaining medications due to transportation or finances?   no  Understanding of regimen: good Understanding of indications: good Potential of compliance: good Patient understands to avoid NSAIDs. Patient understands to avoid decongestants.  Issues to address at subsequent visits: None   Pharmacist comments: Andrew [name] is a pleasant 80 yo M presenting without a medication list but able to verify most of his medications including dosages to me. He reports good compliance but states that he may forget to take 1-2 of his afternoon/evening doses a week. He did not have any specific medication-related questions or concerns for me at this time.   Ruta Hinds. Velva Harman, PharmD, BCPS, CPP Clinical Pharmacist Pager: [phone] Phone: [phone] 10/12/2015 10:22 AM    Time with patient: 6 minutes Preparation and documentation time: 2 mintues Total time: 8 minutes

## 2015-10-12 NOTE — Progress Notes (Signed)
Patient ID: Andrew [name], male   DOB: 27-Apr-1928, 81 y.o.   MRN: [id number] PCP: Dr. Ronnald Ramp HF. Dr. [name]   81 yo with history of CKD, CAD s/p CABG, and ischemic cardiomyopathy with primarily diastolic CHF presents for cardiology followup.  He has a history of PAD followed at [facility].  I took him for [facility] in 11/15. 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  He had had occlusion of a relatively small LAD, there were right to left collaterals and no intervention was done (medical management).    He and his wife went on a cruise along the [place] in 5/16.  He admits to considerable dietary indiscretion (high sodium diet).  It appears that he developed a hypertensive crisis along with chest pain while on the ship. He went into atrial fibrillation and developed CHF.  He was taken to the hospital in [place], [place].  He was in the ICU for about a week on Bipap intermittently.  Troponin peaked at 0.09 during this admission.  He was diuresed and after about 2 wks left the hospital and was able to fly home.    He had echo (6/16) showing EF 50-55%, mild LVH.  Cardiolite (6/16) showed EF 42%, prior anterolateral MI, no ischemia.  He was admitted on 07/06/15 for RLQ pain and fever.  He was treated for UTI and had a kidney stone as well.  He developed SOB after IVF were given for AKI.  He was treated for acute COPD exacerbation as well as CHF decompensation.  He was diuresed with IV lasix.  His weight on admission was 179 and got as high as 188 after IVF. Discharge weight was 181.  At last appointment, I increased his Lasix due to volume overload.  He has lost 3 lbs.  He has dyspnea only with heavy exertion, no shortness of breath walking around his house, yard, or stores.  No chest pain.  He continues to get claudication in the bilateral calves after walking his dog 2-3 blocks, not limited by dyspnea with that distance however.  He has some balance problems that have been attributed to idiopathic peripheral  neuropathy (saw neurology recently).    ECG: Sinus rhythm with 1st degree AV block, IVCD  Labs (12/14): K 4.8, creatinine 1.5 Labs (5/15): LDL 92 Labs (11/15): K 4.1, creatinine 1.5 Labs (12/15): K 4, creatinine 1.5 Labs (1/16): LFTs normal, LDL 51, HDL 47 Labs (5/16): TnI 0.09 Labs (6/16): K 4.8 => 4.4, creatinine 1.79 => 2.26, HCT 28.7 Labs (7/16) K 4.2, creatinine 1.98, LFTs normal, TSH normal Labs (8/16): HCT 27.1 Labs (9/16): hgb 8.1, K 4.3, creatinine 1.8, LFTs normal, TSH elevated  PMH: 1. CKD 2. HTN 3. Hyperlipidemia 4. AAA: s/p surgery with left renal artery bypass in 1997 5. GERD 6. IBS 7. Melanoma s/p reception 8. Diabetes: Diet-controlled 9. Carotid stenosis: Carotid dopplers (4/15) with 40-59% bilateral stenosis. Carotid dopplers (7/16) with < 40% BICA stenosis.  10. PAD: 9/14 ABIs 0.91 right 1.09 left.  7/16 ABIs 0.86 right, 1.1 left; aortoiliac duplex with < 50% bilateral iliac stenosis.  11. CAD: Has super-dominant RCA. s/p CABG 1992.  He had Taxus DES to mid PDA in 5/02. LHC (1/05) with 90% ostial PDA (had DES to this vessel), totally occluded RIMA-PDA, totally occluded PLV, sequential SVG-PLV with 1 branch occluded.  LHC (11/15) with left main off the right cusp, 40-50% ostial left main, total occlusion of the proximal LAD (small vessel) with right to left  collaterals, large super-dominant RCA with 50% ISR mid RCA, 50% ostial PDA, patent SVG-PLV, RIMA-PDA known atretic. Lexiscan Cardiolite (6/16) with EF 42%, prior anterolateral MI, no ischemia.  12. Ischemic cardiomyopathy: Echo (12/09) with EF 45-50%, basal to mid inferolateral hypokinesis. Echo (6/15) with EF 45-50%, akinesis of the mid to apical inferolateral wall.  Echo (6/16) with EF 50-55%, mild LVH, inferior hypokinesis, mild MR.  13. OA 14. Atrial fibrillation/flutter: Paroxysmal.  Noted during 5/16 hospitalization in [place] and in 6/16.  He developed atrial flutter in 8/16, back in NSR by 9/16.  15. Emphysema:  PFTs (8/16) with FVC 88%, FEV1 74%, ratio 81%, TLC 83%, DLCO 31%.  16. Renal cysts 17. Idiopathic peripheral neuropathy.  18. Anemia.   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  FH: Brother with CAD.   ROS: All systems reviewed and negative except as per HPI.   Current Outpatient Prescriptions  Medication Sig Dispense Refill  . acetaminophen (TYLENOL) 500 MG tablet Take 1,000 mg by mouth every 8 (eight) hours as needed for moderate pain.     Marland Kitchen ALPRAZolam (XANAX) 0.5 MG tablet Take 0.25-0.5 mg by mouth 2 (two) times daily as needed for anxiety or sleep.     Marland Kitchen amiodarone (PACERONE) 200 MG tablet Take 1 tablet (200 mg total) by mouth daily.    Marland Kitchen apixaban (ELIQUIS) 2.5 MG TABS tablet Take 1 tablet (2.5 mg total) by mouth 2 (two) times daily. 60 tablet 3  . aspirin EC 81 MG tablet Take 1 tablet (81 mg total) by mouth daily.    Marland Kitchen atorvastatin (LIPITOR) 80 MG tablet TAKE 1 TABLET ONCE DAILY. 30 tablet 0  . cilostazol (PLETAL) 100 MG tablet Take 100 mg by mouth 2 (two) times daily.    . Coenzyme Q10 (CO Q 10 PO) Take 1 tablet by mouth daily.    Marland Kitchen erythromycin with ethanol (THERAMYCIN) 2 % external solution Apply 1 application topically daily as needed (for skin irritation around neck).    . ezetimibe (ZETIA) 10 MG tablet Take 10 mg by mouth daily.    Marland Kitchen FLUoxetine (PROZAC) 20 MG capsule Take 1 capsule (20 mg total) by mouth daily. 90 capsule 3  . furosemide (LASIX) 40 MG tablet Take 60-80 mg by mouth 2 (two) times daily. Take 80 mg (2 tablets) in the morning and 60 mg (1 and 1/2 tablets) in the afternoon    . glucosamine-chondroitin 500-400 MG tablet Take 1 tablet by mouth 2 (two) times daily.     . Hypromellose (GENTEAL MILD) 0.2 % SOLN Place 1 drop into both eyes daily as needed (for dry eyes).     Marland Kitchen losartan (COZAAR) 100 MG tablet Take 100 mg by mouth daily.    . Misc Natural Products (NF FORMULAS TESTOSTERONE) CAPS Take 1 capsule by mouth  daily.     . Multiple Vitamin (MULTIVITAMIN) tablet Take 1 tablet by mouth daily.    Marland Kitchen NIFEdipine (PROCARDIA-XL/ADALAT-CC/NIFEDICAL-XL) 30 MG 24 hr tablet Take 30 mg by mouth daily.    Marland Kitchen omega-3 acid ethyl esters (LOVAZA) 1 G capsule Take 1 g by mouth 2 (two) times daily.    . polyethylene glycol (MIRALAX / GLYCOLAX) packet Take 17 g by mouth daily as needed (constipation).    . potassium chloride SA (K-DUR,KLOR-CON) 20 MEQ tablet TAKE 1 TABLET ONCE DAILY. 30 tablet 11  . tamsulosin (FLOMAX) 0.4 MG CAPS capsule Take 0.4 mg by mouth daily.    . temazepam (RESTORIL)  30 MG capsule TAKE 1 CAPSULE AT BEDTIME AS NEEDED FOR SLEEP. 30 capsule 3  . NITROSTAT 0.4 MG SL tablet DISSOLVE 1 TABLET UNDER TONGUE AS NEEDED FOR CHEST PAIN,MAY REPEAT IN5 MINUTES FOR 2 DOSES. (Patient not taking: Reported on 10/12/2015) 25 tablet 3   No current facility-administered medications for this encounter.   BP 124/58 mmHg  Pulse 71  Wt 180 lb (81.647 kg)  SpO2 97%   General: NAD Neck: JVP 7 cm, no thyromegaly or thyroid nodule.  Lungs: Clear to auscultation bilaterally with normal respiratory effort. CV: Nondisplaced PMI.  Heart irregular S1/S2, no S3/S4, 2/6 early SEM RUSB.  1+ ankle edema bilaterally.  Right carotid bruit. Difficult to palpate pedal pulses.  Abdomen: Soft, nontender, non-distended, no hepatosplenomegaly.  Skin: Bilateral LE wounds, covered. Dressing clean, dry, and intact. Neurologic: Alert and oriented x 3.  Psych: Normal affect. Extremities: No clubbing or cyanosis.   Assessment/Plan:  1. CAD: s/p CABG.  Cath in 11/15 showed occluded LAD with left to right collaterals.  The LAD was a relatively small vessel (super-dominant right).  Managed medically. He had chest pain in the setting of an admission to hospital in [place] with CHF exacerbation.  No chest pain since. Lexiscan Cardiolite in 6/16 with infarction but no ischemia.  - Continue ASA 81.   - Continue statin.  2. Ischemic cardiomyopathy  with primarily diastolic CHF: EF 99991111 on last echo in 6/16.  NYHA class II symptoms currently.  Weight is down, volume status looks better today.  - Continue Lasix to 80 mg am 60 mg pm.  BMET/BNP today.  3. Carotid stenosis: Stable, followed at [facility].  4. PAD: Followed at [facility].  Stable ABIs recently.  Continue cilostazol, unable to come off due to severe calf pain when he stops it. He has claudication with moderate exertion chronically.  5. Hyperlipidemia: Good lipids in 1/16.  6. CKD: BMET today.  He is back on losartan now, will make sure that creatinine has not gone up.  7. Atrial fibrillation/flutter:  NSR today. - Continue amiodarone to 200 mg.  Recent LFTs normal but TSH high.  Will repeat TSH with free T3 and free T4.  Will need to get regular eye exams.  - Continue Eliquis 2.5 bid.  8. Suspected OSA: He cancelled his sleep study due to mask discomfort 9. COPD: History of smoking. PFTs suggestive of emphysema.  10. Leg wounds:  From prolonged sun exposure after fall in August.  Now healing.  11. Frequent falls:  No falls since last appointment.  Saw neurology, falls thought to be related to peripheral neuropathy.   Loralie Champagne 10/12/2015

## 2015-10-13 ENCOUNTER — Encounter: Payer: Self-pay | Admitting: *Deleted

## 2015-10-13 DIAGNOSIS — N182 Chronic kidney disease, stage 2 (mild): Secondary | ICD-10-CM | POA: Diagnosis not present

## 2015-10-13 DIAGNOSIS — F329 Major depressive disorder, single episode, unspecified: Secondary | ICD-10-CM | POA: Diagnosis not present

## 2015-10-13 DIAGNOSIS — I129 Hypertensive chronic kidney disease with stage 1 through stage 4 chronic kidney disease, or unspecified chronic kidney disease: Secondary | ICD-10-CM | POA: Diagnosis not present

## 2015-10-13 DIAGNOSIS — I5032 Chronic diastolic (congestive) heart failure: Secondary | ICD-10-CM | POA: Diagnosis not present

## 2015-10-13 DIAGNOSIS — Z006 Encounter for examination for normal comparison and control in clinical research program: Secondary | ICD-10-CM

## 2015-10-13 DIAGNOSIS — Z9181 History of falling: Secondary | ICD-10-CM | POA: Diagnosis not present

## 2015-10-13 DIAGNOSIS — D649 Anemia, unspecified: Secondary | ICD-10-CM | POA: Diagnosis not present

## 2015-10-13 DIAGNOSIS — E1122 Type 2 diabetes mellitus with diabetic chronic kidney disease: Secondary | ICD-10-CM | POA: Diagnosis not present

## 2015-10-13 DIAGNOSIS — I2510 Atherosclerotic heart disease of native coronary artery without angina pectoris: Secondary | ICD-10-CM | POA: Diagnosis not present

## 2015-10-13 DIAGNOSIS — I4891 Unspecified atrial fibrillation: Secondary | ICD-10-CM | POA: Diagnosis not present

## 2015-10-13 LAB — T3, FREE: T3, Free: 2 pg/mL (ref 2.0–4.4)

## 2015-10-13 NOTE — Progress Notes (Signed)
STATUS FIRST Informed Consent    Subject Name: Andrew [name]  Subject met inclusion and exclusion criteria.The informed consent form, study requirements and expectations were reviewed with the subject and questions and concerns were addressed prior to the signing of the consent form. The subject verbalized understanding of the trail requirements. The subject agreed to participate in the STATUS FIRST research study and signed the informed consent at 10:48 am on 10-12-15. The informed consent was obtained prior to performance of any protocol-specific procedures for the subject. A copy of the signed informed consent was given to the subject and a copy was placed in the subjects's medical record.      10/12/2015 10:48 

## 2015-10-15 DIAGNOSIS — N182 Chronic kidney disease, stage 2 (mild): Secondary | ICD-10-CM | POA: Diagnosis not present

## 2015-10-15 DIAGNOSIS — I5032 Chronic diastolic (congestive) heart failure: Secondary | ICD-10-CM | POA: Diagnosis not present

## 2015-10-15 DIAGNOSIS — F329 Major depressive disorder, single episode, unspecified: Secondary | ICD-10-CM | POA: Diagnosis not present

## 2015-10-15 DIAGNOSIS — I129 Hypertensive chronic kidney disease with stage 1 through stage 4 chronic kidney disease, or unspecified chronic kidney disease: Secondary | ICD-10-CM | POA: Diagnosis not present

## 2015-10-15 DIAGNOSIS — E1122 Type 2 diabetes mellitus with diabetic chronic kidney disease: Secondary | ICD-10-CM | POA: Diagnosis not present

## 2015-10-15 DIAGNOSIS — I2510 Atherosclerotic heart disease of native coronary artery without angina pectoris: Secondary | ICD-10-CM | POA: Diagnosis not present

## 2015-10-15 DIAGNOSIS — D649 Anemia, unspecified: Secondary | ICD-10-CM | POA: Diagnosis not present

## 2015-10-15 DIAGNOSIS — Z9181 History of falling: Secondary | ICD-10-CM | POA: Diagnosis not present

## 2015-10-15 DIAGNOSIS — I4891 Unspecified atrial fibrillation: Secondary | ICD-10-CM | POA: Diagnosis not present

## 2015-10-16 ENCOUNTER — Inpatient Hospital Stay (HOSPITAL_COMMUNITY)
Admission: EM | Admit: 2015-10-16 | Discharge: 2015-10-18 | DRG: 065 | Disposition: A | Discharge status: Home Health | Payer: Medicare PPO | Attending: Internal Medicine | Admitting: Internal Medicine

## 2015-10-16 ENCOUNTER — Emergency Department (HOSPITAL_COMMUNITY): Payer: Medicare PPO

## 2015-10-16 ENCOUNTER — Other Ambulatory Visit: Payer: Self-pay

## 2015-10-16 ENCOUNTER — Encounter (HOSPITAL_COMMUNITY): Payer: Self-pay | Admitting: Emergency Medicine

## 2015-10-16 ENCOUNTER — Telehealth: Payer: Self-pay | Admitting: Neurology

## 2015-10-16 ENCOUNTER — Ambulatory Visit (HOSPITAL_COMMUNITY)
Admission: RE | Admit: 2015-10-16 | Discharge: 2015-10-16 | Disposition: A | Discharge status: Home / Self Care | Payer: Medicare PPO | Source: Ambulatory Visit | Attending: Neurology | Admitting: Neurology

## 2015-10-16 DIAGNOSIS — I5042 Chronic combined systolic (congestive) and diastolic (congestive) heart failure: Secondary | ICD-10-CM | POA: Diagnosis present

## 2015-10-16 DIAGNOSIS — Z79899 Other long term (current) drug therapy: Secondary | ICD-10-CM

## 2015-10-16 DIAGNOSIS — Z7901 Long term (current) use of anticoagulants: Secondary | ICD-10-CM | POA: Diagnosis not present

## 2015-10-16 DIAGNOSIS — K760 Fatty (change of) liver, not elsewhere classified: Secondary | ICD-10-CM | POA: Diagnosis present

## 2015-10-16 DIAGNOSIS — I739 Peripheral vascular disease, unspecified: Secondary | ICD-10-CM | POA: Diagnosis present

## 2015-10-16 DIAGNOSIS — Z96653 Presence of artificial knee joint, bilateral: Secondary | ICD-10-CM | POA: Diagnosis present

## 2015-10-16 DIAGNOSIS — E1165 Type 2 diabetes mellitus with hyperglycemia: Secondary | ICD-10-CM | POA: Diagnosis present

## 2015-10-16 DIAGNOSIS — Z955 Presence of coronary angioplasty implant and graft: Secondary | ICD-10-CM | POA: Diagnosis not present

## 2015-10-16 DIAGNOSIS — I63441 Cerebral infarction due to embolism of right cerebellar artery: Principal | ICD-10-CM | POA: Diagnosis present

## 2015-10-16 DIAGNOSIS — Z888 Allergy status to other drugs, medicaments and biological substances status: Secondary | ICD-10-CM | POA: Diagnosis not present

## 2015-10-16 DIAGNOSIS — K219 Gastro-esophageal reflux disease without esophagitis: Secondary | ICD-10-CM | POA: Diagnosis present

## 2015-10-16 DIAGNOSIS — I130 Hypertensive heart and chronic kidney disease with heart failure and stage 1 through stage 4 chronic kidney disease, or unspecified chronic kidney disease: Secondary | ICD-10-CM | POA: Diagnosis present

## 2015-10-16 DIAGNOSIS — E559 Vitamin D deficiency, unspecified: Secondary | ICD-10-CM | POA: Diagnosis present

## 2015-10-16 DIAGNOSIS — I483 Typical atrial flutter: Secondary | ICD-10-CM | POA: Diagnosis not present

## 2015-10-16 DIAGNOSIS — Z7982 Long term (current) use of aspirin: Secondary | ICD-10-CM

## 2015-10-16 DIAGNOSIS — I480 Paroxysmal atrial fibrillation: Secondary | ICD-10-CM | POA: Diagnosis present

## 2015-10-16 DIAGNOSIS — G609 Hereditary and idiopathic neuropathy, unspecified: Secondary | ICD-10-CM

## 2015-10-16 DIAGNOSIS — N183 Chronic kidney disease, stage 3 (moderate): Secondary | ICD-10-CM | POA: Diagnosis not present

## 2015-10-16 DIAGNOSIS — E1169 Type 2 diabetes mellitus with other specified complication: Secondary | ICD-10-CM | POA: Diagnosis present

## 2015-10-16 DIAGNOSIS — Z8249 Family history of ischemic heart disease and other diseases of the circulatory system: Secondary | ICD-10-CM

## 2015-10-16 DIAGNOSIS — D649 Anemia, unspecified: Secondary | ICD-10-CM | POA: Diagnosis not present

## 2015-10-16 DIAGNOSIS — Z87891 Personal history of nicotine dependence: Secondary | ICD-10-CM

## 2015-10-16 DIAGNOSIS — E1151 Type 2 diabetes mellitus with diabetic peripheral angiopathy without gangrene: Secondary | ICD-10-CM | POA: Diagnosis not present

## 2015-10-16 DIAGNOSIS — E785 Hyperlipidemia, unspecified: Secondary | ICD-10-CM | POA: Diagnosis present

## 2015-10-16 DIAGNOSIS — I5032 Chronic diastolic (congestive) heart failure: Secondary | ICD-10-CM | POA: Diagnosis present

## 2015-10-16 DIAGNOSIS — I63541 Cerebral infarction due to unspecified occlusion or stenosis of right cerebellar artery: Secondary | ICD-10-CM | POA: Diagnosis not present

## 2015-10-16 DIAGNOSIS — I639 Cerebral infarction, unspecified: Secondary | ICD-10-CM

## 2015-10-16 DIAGNOSIS — E118 Type 2 diabetes mellitus with unspecified complications: Secondary | ICD-10-CM | POA: Diagnosis not present

## 2015-10-16 DIAGNOSIS — Z951 Presence of aortocoronary bypass graft: Secondary | ICD-10-CM

## 2015-10-16 DIAGNOSIS — Z833 Family history of diabetes mellitus: Secondary | ICD-10-CM | POA: Diagnosis not present

## 2015-10-16 DIAGNOSIS — IMO0002 Reserved for concepts with insufficient information to code with codable children: Secondary | ICD-10-CM | POA: Diagnosis present

## 2015-10-16 DIAGNOSIS — Z86718 Personal history of other venous thrombosis and embolism: Secondary | ICD-10-CM | POA: Diagnosis not present

## 2015-10-16 DIAGNOSIS — E1122 Type 2 diabetes mellitus with diabetic chronic kidney disease: Secondary | ICD-10-CM | POA: Diagnosis not present

## 2015-10-16 DIAGNOSIS — E039 Hypothyroidism, unspecified: Secondary | ICD-10-CM | POA: Diagnosis present

## 2015-10-16 DIAGNOSIS — I10 Essential (primary) hypertension: Secondary | ICD-10-CM

## 2015-10-16 DIAGNOSIS — N1832 Chronic kidney disease, stage 3b: Secondary | ICD-10-CM | POA: Diagnosis present

## 2015-10-16 DIAGNOSIS — R270 Ataxia, unspecified: Secondary | ICD-10-CM

## 2015-10-16 DIAGNOSIS — I6789 Other cerebrovascular disease: Secondary | ICD-10-CM | POA: Diagnosis not present

## 2015-10-16 DIAGNOSIS — F329 Major depressive disorder, single episode, unspecified: Secondary | ICD-10-CM | POA: Diagnosis present

## 2015-10-16 DIAGNOSIS — I2510 Atherosclerotic heart disease of native coronary artery without angina pectoris: Secondary | ICD-10-CM | POA: Diagnosis present

## 2015-10-16 LAB — CBC WITH DIFFERENTIAL/PLATELET
BASOS ABS: 0 10*3/uL (ref 0.0–0.1)
BASOS PCT: 0 %
EOS ABS: 0.2 10*3/uL (ref 0.0–0.7)
EOS PCT: 4 %
HEMATOCRIT: 29.4 % — AB (ref 39.0–52.0)
Hemoglobin: 9.5 g/dL — ABNORMAL LOW (ref 13.0–17.0)
Lymphocytes Relative: 20 %
Lymphs Abs: 1.2 10*3/uL (ref 0.7–4.0)
MCH: 30.5 pg (ref 26.0–34.0)
MCHC: 32.3 g/dL (ref 30.0–36.0)
MCV: 94.5 fL (ref 78.0–100.0)
MONO ABS: 0.5 10*3/uL (ref 0.1–1.0)
MONOS PCT: 8 %
NEUTROS ABS: 4.3 10*3/uL (ref 1.7–7.7)
Neutrophils Relative %: 68 %
PLATELETS: 211 10*3/uL (ref 150–400)
RBC: 3.11 MIL/uL — ABNORMAL LOW (ref 4.22–5.81)
RDW: 15.3 % (ref 11.5–15.5)
WBC: 6.3 10*3/uL (ref 4.0–10.5)

## 2015-10-16 LAB — BASIC METABOLIC PANEL
ANION GAP: 10 (ref 5–15)
BUN: 51 mg/dL — ABNORMAL HIGH (ref 6–20)
CALCIUM: 9 mg/dL (ref 8.9–10.3)
CO2: 25 mmol/L (ref 22–32)
CREATININE: 2.23 mg/dL — AB (ref 0.61–1.24)
Chloride: 100 mmol/L — ABNORMAL LOW (ref 101–111)
GFR, EST AFRICAN AMERICAN: 29 mL/min — AB (ref >60)
GFR, EST NON AFRICAN AMERICAN: 25 mL/min — AB (ref >60)
Glucose, Bld: 90 mg/dL (ref 65–99)
Potassium: 4.2 mmol/L (ref 3.5–5.1)
SODIUM: 135 mmol/L (ref 135–145)

## 2015-10-16 LAB — CBG MONITORING, ED: Glucose-Capillary: 92 mg/dL (ref 65–99)

## 2015-10-16 MED ORDER — INSULIN ASPART 100 UNIT/ML ~~LOC~~ SOLN
0.0000 [IU] | Freq: Three times a day (TID) | SUBCUTANEOUS | Status: DC
  Administered 2015-10-17 – 2015-10-18 (×2): 3 [IU] via SUBCUTANEOUS

## 2015-10-16 MED ORDER — INSULIN ASPART 100 UNIT/ML ~~LOC~~ SOLN: 0.0000 [IU] | Freq: Every day | SUBCUTANEOUS | Status: DC

## 2015-10-16 NOTE — Consult Note (Signed)
Neurology Consultation Reason for consult: Stroke Referring Physician: Lacinda Axon, B  CC: Ataxia  History is obtained from: Patient  HPI: Andrew [name] is a 81 y.o. male with a history of several months with ataxia for which she was evaluated by Dr. Tomi Likens earlier this month. Dr. Tomi Likens ordered an MRI to further evaluate the patient which was performed today and showed an acute cerebellar infarct. The patient's symptoms are not clearly correlated with this infarct as they have been going on for quite some time, and he feels that he might even be slightly better now than he was at the time of his evaluation by Dr. Tomi Likens.  He saw Dr. Tomi Likens not because his symptoms were necessarily worsening, but because he did have a fall. He points to the middle of the summer as when his symptoms started.  He has not noticed any changes over the past few days to weeks and denies vertigo, worsening gait problems, double vision. He does notice that he might of had a slight increase in clumsiness while playing the piano over the past day or 2.  On review of symptoms he does note that he has had significant increase in sleepiness over the past few months.  LKW: Unknown tpa given?: no, unknown time of onset  ROS: A 14 point ROS was performed and is negative except as noted in the HPI.   Past Medical History  Diagnosis Date  . CAD (coronary artery disease)   . LVF (left ventricular failure) ([place])   . Carotid artery stenosis   . Hyperlipidemia   . Hypertension   . GERD (gastroesophageal reflux disease)   . IBS (irritable bowel syndrome)   . Arthritis   . Diabetes mellitus   . Thyroid disease   . Anemia   . Vitamin D deficiency   . Depression   . Fatty liver 2008  . Cancer ([place])     Melanoma - Back  . AAA (abdominal aortic aneurysm) ([place])   . Pneumonia Jan. 2014  . CHF (congestive heart failure) ([place])   . DVT (deep venous thrombosis) ([place])   . Paroxysmal atrial fibrillation (HCC)   . Typical atrial  flutter (HCC)      Family History  Problem Relation Age of Onset  . Colon cancer Father   . Cancer Father   . Coronary artery disease Brother   . Diabetes Brother   . Cancer Brother     throat and abdominal  . Heart disease Brother   . Hyperlipidemia Brother   . Peripheral vascular disease Brother     Varicose Veins  . Diabetes Son   . Hyperlipidemia Son      Social History:  reports that he has quit smoking. He has never used smokeless tobacco. He reports that he drinks about 1.8 oz of alcohol per week. He reports that he does not use illicit drugs.   Exam: Current vital signs: BP 144/64 mmHg  Pulse 75  Temp(Src) 97.6 F (36.4 C) (Oral)  Resp 16  Ht 5\' 6"  (1.676 m)  Wt 79.833 kg (176 lb)  BMI 28.42 kg/m2  SpO2 92% Vital signs in last 24 hours: Temp:  [97.6 F (36.4 C)] 97.6 F (36.4 C) (10/21 1859) Pulse Rate:  [66-76] 75 (10/21 2145) Resp:  [15-20] 16 (10/21 2145) BP: (127-149)/(60-68) 144/64 mmHg (10/21 2145) SpO2:  [92 %-100 %] 92 % (10/21 2145) Weight:  [79.833 kg (176 lb)] 79.833 kg (176 lb) (10/21 1859)  Physical Exam  Constitutional: Appears well-developed  and well-nourished.  Psych: Affect appropriate to situation Eyes: No scleral injection HENT: No OP obstrucion Head: Normocephalic.  Cardiovascular: Normal rate and regular rhythm.  Respiratory: Effort normal and breath sounds normal to anterior ascultation GI: Soft.  No distension. There is no tenderness.  Skin: Bilateral eschar remaining from his cellulitis over the anterior shins.  Neuro: Mental Status: Patient is awake, alert, oriented to person, place, month, year, and situation. Patient is able to give a clear and coherent history. No signs of aphasia or neglect Cranial Nerves: II: Visual Fields are full. Pupils are equal, round, and reactive to light.   III,IV, VI: EOMI without ptosis or diploplia.  V: Facial sensation is symmetric to temperature VII: Facial movement is symmetric.   VIII: hearing is intact to voice X: Uvula elevates symmetrically XI: Shoulder shrug is symmetric. XII: tongue is midline without atrophy or fasciculations.  Motor: Tone is normal. Bulk is normal. 5/5 strength was present in all four extremities.  Sensory: Sensation is symmetric to light touch and temperature in the arms and legs. Deep Tendon Reflexes: 2+ and symmetric in the biceps and patellae.  Plantars: Toes are downgoing bilaterally.  Cerebellar: He does have mild ataxia in the right arm on finger-nose-finger, difficult to test heel-knee-shin due to     I have reviewed labs in epic and the results pertinent to this consultation are: Mildly elevated creatinine.  I have reviewed the images obtained: MRI brain-acute right cerebellar infarct with previous cerebellar infarcts  Impression: 81 year old male with acute cerebellar infarct. I suspect that he has had previous infarcts accounting for his symptoms and I'm concerned that this represents failure of medical therapy given that he is already on high-dose statin and anticoagulation.  Further imaging of the posterior circulation would be very helpful, but he does appear to have an acute increase in his creatinine be hesitant to give IV contrast at this time. It could be given safely after hydration, etc. then a CT angiogram of the head and neck may be helpful.  MR angiogram of the head can be performed without contrast and I would perform this.  Alternatively, this could be due to his atrial fibrillation.  Recommendations: 1. HgbA1c, fasting lipid panel 2. MRI, MRA  of the brain without contrast 3. Frequent neuro checks 4. Echocardiogram 5. Carotid dopplers if no other imaging modalities of the neck vessels are performed. 6. Prophylactic therapy-eliquis 7. Risk factor modification 8. Telemetry monitoring 9. PT consult, OT consult, Speech consult    Roland Rack, MD [facility] [phone]  If  7pm- 7am, please page neurology on call as listed in [place].

## 2015-10-16 NOTE — ED Notes (Signed)
CBG- 92 

## 2015-10-16 NOTE — Telephone Encounter (Signed)
Received call from radiology, Dr. Ashok Croon, that pt had acute cerebellar infarct on scan.  Dr. Ashok Croon had already sent pt to ED for further evaluation

## 2015-10-16 NOTE — ED Notes (Signed)
Dr Leonel Ramsay Neurologist in room with patient.

## 2015-10-16 NOTE — ED Provider Notes (Signed)
CSN: [id number]     Arrival date & time 10/16/15  1849 History   First MD Initiated Contact with Patient 10/16/15 1911     Chief Complaint  Patient presents with  . Weakness  . Abnormal Lab     (Consider location/radiation/quality/duration/timing/severity/associated sxs/prior Treatment) HPI....... unsteady gait since July 2016. Patient was initially seen by his primary care doctor and referred to a neurologist.  Outpatient MRI of brain ordered today.  Radiologist noted cerebellar infarct.  Pt states ataxic gait.  No motor or sensory deficits.  No fever, chills, stiff neck.  Severity of symptoms is mild to moderate.  Past Medical History  Diagnosis Date  . CAD (coronary artery disease)   . LVF (left ventricular failure) ([place])   . Carotid artery stenosis   . Hyperlipidemia   . Hypertension   . GERD (gastroesophageal reflux disease)   . IBS (irritable bowel syndrome)   . Arthritis   . Diabetes mellitus   . Thyroid disease   . Anemia   . Vitamin D deficiency   . Depression   . Fatty liver 2008  . Cancer ([place])     Melanoma - Back  . AAA (abdominal aortic aneurysm) ([place])   . Pneumonia Jan. 2014  . CHF (congestive heart failure) ([place])   . DVT (deep venous thrombosis) ([place])   . Paroxysmal atrial fibrillation (H[facility])   . Typical atrial flutter [facility])    Past Surgical History  Procedure Laterality Date  . Total knee arthroplasty      bilateral- Pt fell 3 steps  . Cardiac stents  2005  . Coronary artery bypass graft  1992  . Joint replacement      Bilateral knee arthroplasty  . Eye surgery      [place]  . Abdominal aortic aneurysm repair  2002  . Cardiac catheterization    . Left heart catheterization with coronary angiogram N/A 10/30/2014    Procedure: LEFT HEART CATHETERIZATION WITH CORONARY ANGIOGRAM;  Surgeon: Larey Dresser, MD;  Location: [facility] CATH LAB;  Service: Cardiovascular;  Laterality: N/A;  . [place]  June 2016    While in [place]  2016   Family History  Problem  Relation Age of Onset  . Colon cancer Father   . Cancer Father   . Coronary artery disease Brother   . Diabetes Brother   . Cancer Brother     throat and abdominal  . Heart disease Brother   . Hyperlipidemia Brother   . Peripheral vascular disease Brother     Varicose Veins  . Diabetes Son   . Hyperlipidemia Son    Social History  Substance Use Topics  . 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 Smokeless tobacco: Never Used     Comment: quit atleast 25 yrs ago, per pt  . Alcohol Use: 1.8 oz/week    3 Shots of liquor per week     Comment: socially    Review of Systems  All other systems reviewed and are negative.     Allergies  Metoprolol and Metformin and related  Home Medications   Prior to Admission medications   Medication Sig Start Date End Date Taking? Authorizing Provider  acetaminophen (TYLENOL) 500 MG tablet Take 1,000 mg by mouth every 8 (eight) hours as needed for moderate pain.     Historical Provider, MD  ALPRAZolam Duanne Moron) 0.5 MG tablet Take 0.25-0.5 mg by mouth 2 (two) times daily as needed for anxiety or sleep.     Historical Provider, MD  amiodarone (PACERONE) 200 MG tablet Take 1 tablet (200 mg total) by mouth daily. 09/10/15   Larey Dresser, MD  apixaban (ELIQUIS) 2.5 MG TABS tablet Take 1 tablet (2.5 mg total) by mouth 2 (two) times daily. 05/27/15   Larey Dresser, MD  aspirin EC 81 MG tablet Take 1 tablet (81 mg total) by mouth daily. 05/22/14   Larey Dresser, MD  atorvastatin (LIPITOR) 80 MG tablet TAKE 1 TABLET ONCE DAILY. 10/05/15   Larey Dresser, MD  cilostazol (PLETAL) 100 MG tablet Take 100 mg by mouth 2 (two) times daily.    Historical Provider, MD  Coenzyme Q10 (CO Q 10 PO) Take 1 tablet by mouth daily.    Historical Provider, MD  erythromycin with ethanol (THERAMYCIN) 2 % external solution Apply 1 application topically daily as needed (for skin irritation around neck).    Historical Provider, MD  ezetimibe (ZETIA) 10 MG tablet Take 10 mg by mouth  daily.    Historical Provider, MD  FLUoxetine (PROZAC) 20 MG capsule Take 1 capsule (20 mg total) by mouth daily. 03/05/15   Janith Lima, MD  furosemide (LASIX) 40 MG tablet Take 60-80 mg by mouth 2 (two) times daily. Take 80 mg (2 tablets) in the morning and 60 mg (1 and 1/2 tablets) in the afternoon    Historical Provider, MD  glucosamine-chondroitin 500-400 MG tablet Take 1 tablet by mouth 2 (two) times daily.     Historical Provider, MD  Hypromellose (GENTEAL MILD) 0.2 % SOLN Place 1 drop into both eyes daily as needed (for dry eyes).     Historical Provider, MD  losartan (COZAAR) 100 MG tablet Take 100 mg by mouth daily.    Historical Provider, MD  Misc Natural Products (NF FORMULAS TESTOSTERONE) CAPS Take 1 capsule by mouth daily.     Historical Provider, MD  Multiple Vitamin (MULTIVITAMIN) tablet Take 1 tablet by mouth daily.    Historical Provider, MD  NIFEdipine (PROCARDIA-XL/ADALAT-CC/NIFEDICAL-XL) 30 MG 24 hr tablet Take 30 mg by mouth daily.    Historical Provider, MD  NITROSTAT 0.4 MG SL tablet DISSOLVE 1 TABLET UNDER TONGUE AS NEEDED FOR CHEST PAIN,MAY REPEAT IN5 MINUTES FOR 2 DOSES. Patient not taking: Reported on 10/12/2015 07/13/15   Janith Lima, MD  omega-3 acid ethyl esters (LOVAZA) 1 G capsule Take 1 g by mouth 2 (two) times daily.    Historical Provider, MD  polyethylene glycol (MIRALAX / GLYCOLAX) packet Take 17 g by mouth daily as needed (constipation).    Historical Provider, MD  potassium chloride SA (K-DUR,KLOR-CON) 20 MEQ tablet TAKE 1 TABLET ONCE DAILY. 09/21/15   Janith Lima, MD  tamsulosin (FLOMAX) 0.4 MG CAPS capsule Take 0.4 mg by mouth daily.    Historical Provider, MD  temazepam (RESTORIL) 30 MG capsule TAKE 1 CAPSULE AT BEDTIME AS NEEDED FOR SLEEP. 09/14/15   Janith Lima, MD   BP 153/69 mmHg  Pulse 79  Temp(Src) 98.1 F (36.7 C) (Oral)  Resp 14  Ht 5\' 6"  (1.676 m)  Wt 176 lb (79.833 kg)  BMI 28.42 kg/m2  SpO2 97% Physical Exam  Constitutional: He  is oriented to person, place, and time. He appears well-developed and well-nourished.  HENT:  Head: Normocephalic and atraumatic.  Eyes: Conjunctivae and EOM are normal. Pupils are equal, round, and reactive to light.  Neck: Normal range of motion. Neck supple.  Cardiovascular: Normal rate and regular rhythm.   Pulmonary/Chest: Effort normal and breath sounds normal.  Abdominal: Soft. Bowel sounds are normal.  Musculoskeletal: Normal range of motion.  Neurological: He is alert and oriented to person, place, and time.  Ataxic gait by history.  Skin: Skin is warm and dry.  Psychiatric: He has a normal mood and affect. His behavior is normal.  Nursing note and vitals reviewed.   ED Course  Procedures (including critical care time) Labs Review Labs Reviewed  CBC WITH DIFFERENTIAL/PLATELET - Abnormal; Notable for the following:    RBC 3.11 (*)    Hemoglobin 9.5 (*)    HCT 29.4 (*)    All other components within normal limits  BASIC METABOLIC PANEL - Abnormal; Notable for the following:    Chloride 100 (*)    BUN 51 (*)    Creatinine, Ser 2.23 (*)    GFR calc non Af Amer 25 (*)    GFR calc Af Amer 29 (*)    All other components within normal limits    Imaging Review Mr Brain Wo Contrast  10/16/2015  CLINICAL DATA:  Hereditary neuropathy. Ataxia over the last few months. EXAM: MRI HEAD WITHOUT CONTRAST TECHNIQUE: Multiplanar, multiecho pulse sequences of the brain and surrounding structures were obtained without intravenous contrast. COMPARISON:  MRI brain 05/22/2013. FINDINGS: Acute nonhemorrhagic 1.5 cm infarct is present in the right cerebellum posteriorly. This likely accounts for the patient's ataxia. Moderate atrophy and periventricular white matter disease is otherwise stable. No acute hemorrhage or mass lesion present. The ventricles are proportionate to the degree of atrophy. Basal ganglia are intact. Dilated perivascular spaces are again noted, particularly in the right  caudate head. Flow is present in the major intracranial arteries the internal auditory canals are within normal limits. Bilateral lens replacements are present. The globes and orbits are otherwise intact. The paranasal sinuses are clear. There is some fluid in the mastoid air cells. No obstructing nasopharyngeal lesion is evident. The skullbase is within normal limits. Mild degenerative changes are present in the upper cervical spine. Midline structures are otherwise unremarkable. IMPRESSION: 1. Acute nonhemorrhagic 1.5 cm infarct within the right cerebellum. 2. Moderate generalized atrophy and white matter disease is otherwise stable. This is compatible with chronic microvascular ischemic change. 3. Mild degenerative changes within the upper cervical spine are also similar to the prior exam. Electronically Signed   By: San Morelle M.D.   On: 10/16/2015 18:52   I have personally reviewed and evaluated these images and lab results as part of my medical decision-making.   EKG Interpretation None      MDM   Final diagnoses:  Cerebellar infarct [facility])    CT scan shows an acute nonhemorrhagic 1.5 cm infarct in the right cerebellum. Discussed with neurology who will consult. Admit to general medicine.    Nat Christen, MD 10/16/15 [phone]

## 2015-10-16 NOTE — ED Notes (Signed)
Pt from PCP for eval of abnormal MRI that was done today. Pt states has been unsteady on his feet for the past few months so went to pcp to have a follow up and was told to have MRI done. Sent here for possible admission-MRI shows acute infarct. Pt alert and oriented, mild weakness noted to RUE. Pt states "I don't feel as unsteady as I did before, I actually feel better. "

## 2015-10-16 NOTE — H&P (Signed)
[facility] Admission History and Physical       Andrew [name] [id number] DOB: 1928-08-25 DOA: 10/16/2015  Referring physician: EDP PCP: [name], MD  Specialists:   Chief Complaint: Unsteady on his feet  HPI: Andrew [name] is a 81 y.o. male with Multiple medical problems who complaints of having intermittent dizziness, and being unsteady on his feet for the past 3-4 weeks who had an outpatient MRI performed today which revealed an acute nonhemorrhagic infarct of the right cerebellum and was sent to the ED for further evaluation.  He denies having any chest pain, or headaches or visual changes.  He was seen by Neurology in the ED dr. Leonel Ramsay and was referred for medical admission.      Review of Systems:  Constitutional: No Weight Loss, No Weight Gain, Night Sweats, Fevers, Chills, +Dizziness, Light Headedness, Fatigue, or Generalized Weakness HEENT: No Headaches, Difficulty Swallowing,Tooth/Dental Problems,Sore Throat,  No Sneezing, Rhinitis, Ear Ache, Nasal Congestion, or Post Nasal Drip,  Cardio-vascular:  No Chest pain, Orthopnea, PND, Edema in Lower Extremities, Anasarca, Dizziness, Palpitations  Resp: No Dyspnea, No DOE, No Productive Cough, No Non-Productive Cough, No Hemoptysis, No Wheezing.    GI: No Heartburn, Indigestion, Abdominal Pain, Nausea, Vomiting, Diarrhea, Constipation, Hematemesis, Hematochezia, Melena, Change in Bowel Habits,  Loss of Appetite  GU: No Dysuria, No Change in Color of Urine, No Urgency or Urinary Frequency, No Flank pain.  Musculoskeletal: No Joint Pain or Swelling, No Decreased Range of Motion, No Back Pain.  Neurologic: No Syncope, No Seizures, Muscle Weakness, Paresthesia, Vision Disturbance or Loss, No Diplopia, No Vertigo, +Ataxia, No Difficulty Walking,  Skin: No Rash or Lesions. Psych: No Change in Mood or Affect, No Depression or Anxiety, No Memory loss, No Confusion, or Hallucinations   Past Medical History    Diagnosis Date  . CAD (coronary artery disease)   . LVF (left ventricular failure) ([place])   . Carotid artery stenosis   . Hyperlipidemia   . Hypertension   . GERD (gastroesophageal reflux disease)   . IBS (irritable bowel syndrome)   . Arthritis   . Diabetes mellitus   . Thyroid disease   . Anemia   . Vitamin D deficiency   . Depression   . Fatty liver 2008  . Cancer ([place])     Melanoma - Back  . AAA (abdominal aortic aneurysm) ([place])   . Pneumonia Jan. 2014  . CHF (congestive heart failure) ([place])   . DVT (deep venous thrombosis) ([place])   . Paroxysmal atrial fibrillation (HCC)   . Typical atrial flutter [facility])      Past Surgical History  Procedure Laterality Date  . Total knee arthroplasty      bilateral- Pt fell 3 steps  . Cardiac stents  2005  . Coronary artery bypass graft  1992  . Joint replacement      Bilateral knee arthroplasty  . Eye surgery      Catarart  . Abdominal aortic aneurysm repair  2002  . Cardiac catheterization    . Left heart catheterization with coronary angiogram N/A 10/30/2014    Procedure: LEFT HEART CATHETERIZATION WITH CORONARY ANGIOGRAM;  Surgeon: Larey Dresser, MD;  Location: [facility] CATH LAB;  Service: Cardiovascular;  Laterality: N/A;  . [place]  June 2016    While in [place]  2016      Prior to Admission medications   Medication Sig Start Date End Date Taking? Authorizing Provider  acetaminophen (TYLENOL) 500 MG tablet Take 1,000 mg by  mouth every 8 (eight) hours as needed for moderate pain.     Historical Provider, MD  ALPRAZolam Duanne Moron) 0.5 MG tablet Take 0.25-0.5 mg by mouth 2 (two) times daily as needed for anxiety or sleep.     Historical Provider, MD  amiodarone (PACERONE) 200 MG tablet Take 1 tablet (200 mg total) by mouth daily. 09/10/15   Larey Dresser, MD  apixaban (ELIQUIS) 2.5 MG TABS tablet Take 1 tablet (2.5 mg total) by mouth 2 (two) times daily. 05/27/15   Larey Dresser, MD  aspirin EC 81 MG tablet Take 1 tablet (81 mg total) by  mouth daily. 05/22/14   Larey Dresser, MD  atorvastatin (LIPITOR) 80 MG tablet TAKE 1 TABLET ONCE DAILY. 10/05/15   Larey Dresser, MD  cilostazol (PLETAL) 100 MG tablet Take 100 mg by mouth 2 (two) times daily.    Historical Provider, MD  Coenzyme Q10 (CO Q 10 PO) Take 1 tablet by mouth daily.    Historical Provider, MD  erythromycin with ethanol (THERAMYCIN) 2 % external solution Apply 1 application topically daily as needed (for skin irritation around neck).    Historical Provider, MD  ezetimibe (ZETIA) 10 MG tablet Take 10 mg by mouth daily.    Historical Provider, MD  FLUoxetine (PROZAC) 20 MG capsule Take 1 capsule (20 mg total) by mouth daily. 03/05/15   Janith Lima, MD  furosemide (LASIX) 40 MG tablet Take 60-80 mg by mouth 2 (two) times daily. Take 80 mg (2 tablets) in the morning and 60 mg (1 and 1/2 tablets) in the afternoon    Historical Provider, MD  glucosamine-chondroitin 500-400 MG tablet Take 1 tablet by mouth 2 (two) times daily.     Historical Provider, MD  Hypromellose (GENTEAL MILD) 0.2 % SOLN Place 1 drop into both eyes daily as needed (for dry eyes).     Historical Provider, MD  losartan (COZAAR) 100 MG tablet Take 100 mg by mouth daily.    Historical Provider, MD  Misc Natural Products (NF FORMULAS TESTOSTERONE) CAPS Take 1 capsule by mouth daily.     Historical Provider, MD  Multiple Vitamin (MULTIVITAMIN) tablet Take 1 tablet by mouth daily.    Historical Provider, MD  NIFEdipine (PROCARDIA-XL/ADALAT-CC/NIFEDICAL-XL) 30 MG 24 hr tablet Take 30 mg by mouth daily.    Historical Provider, MD  NITROSTAT 0.4 MG SL tablet DISSOLVE 1 TABLET UNDER TONGUE AS NEEDED FOR CHEST PAIN,MAY REPEAT IN5 MINUTES FOR 2 DOSES. Patient not taking: Reported on 10/12/2015 07/13/15   Janith Lima, MD  omega-3 acid ethyl esters (LOVAZA) 1 G capsule Take 1 g by mouth 2 (two) times daily.    Historical Provider, MD  polyethylene glycol (MIRALAX / GLYCOLAX) packet Take 17 g by mouth daily as  needed (constipation).    Historical Provider, MD  potassium chloride SA (K-DUR,KLOR-CON) 20 MEQ tablet TAKE 1 TABLET ONCE DAILY. 09/21/15   Janith Lima, MD  tamsulosin (FLOMAX) 0.4 MG CAPS capsule Take 0.4 mg by mouth daily.    Historical Provider, MD  temazepam (RESTORIL) 30 MG capsule TAKE 1 CAPSULE AT BEDTIME AS NEEDED FOR SLEEP. 09/14/15   Janith Lima, MD     Allergies  Allergen Reactions  . Metoprolol Shortness Of Breath and Palpitations    Heart starts racing. Shallow breathing   . Metformin And Related Nausea And Vomiting    Social History:  reports that he has quit smoking. He has never used smokeless tobacco. He reports that  he drinks about 1.8 oz of alcohol per week. He reports that he does not use illicit drugs.    Family History  Problem Relation Age of Onset  . Colon cancer Father   . Cancer Father   . Coronary artery disease Brother   . Diabetes Brother   . Cancer Brother     throat and abdominal  . Heart disease Brother   . Hyperlipidemia Brother   . Peripheral vascular disease Brother     Varicose Veins  . Diabetes Son   . Hyperlipidemia Son        Physical Exam:  GEN:  Pleasant Elderly Well developed 81 y.o. Caucasian male examined and in no acute distress; cooperative with exam Filed Vitals:   10/16/15 2130 10/16/15 2145 10/16/15 2200 10/16/15 2215  BP: 147/64 144/64 148/62 153/69  Pulse: 75 75 79 79  Temp:      TempSrc:      Resp: 15 16 24 14   Height:      Weight:      SpO2: 95% 92% 93% 97%   Blood pressure 153/69, pulse 79, temperature 98.1 F (36.7 C), temperature source Oral, resp. rate 14, height 5\' 6"  (1.676 m), weight 79.833 kg (176 lb), SpO2 97 %. PSYCH: He is alert and oriented x4; does not appear anxious does not appear depressed; affect is normal HEENT: Normocephalic and Atraumatic, Mucous membranes pink; PERRLA; EOM intact; Fundi:  Benign;  No scleral icterus, Nares: Patent, Oropharynx: Clear, Fair Dentition,    Neck:  FROM, No  Cervical Lymphadenopathy nor Thyromegaly or Carotid Bruit; No JVD; Breasts:: Not examined CHEST WALL: No tenderness CHEST: Normal respiration, clear to auscultation bilaterally HEART: Regular rate and rhythm; no murmurs rubs or gallops BACK: No kyphosis or scoliosis; No CVA tenderness ABDOMEN: Positive Bowel Sounds, Obese, Soft Non-Tender, No Rebound or Guarding; No Masses, No Organomegaly Rectal Exam: Not done EXTREMITIES: No Cyanosis, Clubbing, or Edema; No Ulcerations. Genitalia: not examined PULSES: 2+ and symmetric SKIN: Normal hydration no rash or ulceration  CNS:  Alert and Oriented x 4, No Focal Deficits  Mental Status:  Alert, Oriented, Thought Content Appropriate. Speech Fluent without evidence of Aphasia. Able to follow 3 step commands without difficulty.  In No obvious pain.   Cranial Nerves:  II: Discs flat bilaterally; Visual fields Intact, Pupils equal and reactive.    III,IV, VI: Extra-ocular motions intact bilaterally    V,VII: smile symmetric, facial light touch sensation normal bilaterally    VIII: hearing intact bilaterally    IX,X: gag reflex present    XI: bilateral shoulder shrug    XII: midline tongue extension   Motor:  Right:  Upper extremity 5/5     Left:  Upper extremity 5/5     Right:  Lower extremity 5/5    Left:  Lower extremity 5/5     Tone and Bulk:  normal tone throughout; no atrophy noted   Sensory:  Pinprick and light touch intact throughout, bilaterally   Deep Tendon Reflexes: 2+ and symmetric throughout   Plantars/ Babinski:  Right: normal Left: normal    Cerebellar:  Finger to nose without difficulty.   Gait: deferred    Vascular: pulses palpable throughout    Labs on Admission:  Basic Metabolic Panel:  Recent Labs Lab 10/12/15 1058 10/16/15 2045  NA 141 135  K 4.5 4.2  CL 107 100*  CO2 27 25  GLUCOSE 113* 90  BUN 40* 51*  CREATININE 1.79* 2.23*  CALCIUM 9.5 9.0  Liver Function Tests: No results for input(s): AST,  ALT, ALKPHOS, BILITOT, PROT, ALBUMIN in the last 168 hours. No results for input(s): LIPASE, AMYLASE in the last 168 hours. No results for input(s): AMMONIA in the last 168 hours. CBC:  Recent Labs Lab 10/16/15 2045  WBC 6.3  NEUTROABS 4.3  HGB 9.5*  HCT 29.4*  MCV 94.5  PLT 211   Cardiac Enzymes: No results for input(s): CKTOTAL, CKMB, CKMBINDEX, TROPONINI in the last 168 hours.  BNP (last 3 results)  Recent Labs  06/17/15 1050 07/30/15 1030 10/12/15 1058  BNP 245.4* 323.3* 376.3*    ProBNP (last 3 results) No results for input(s): PROBNP in the last 8760 hours.  CBG: No results for input(s): GLUCAP in the last 168 hours.  Radiological Exams on Admission: Mr Brain Wo Contrast  10/16/2015  CLINICAL DATA:  Hereditary neuropathy. Ataxia over the last few months. EXAM: MRI HEAD WITHOUT CONTRAST TECHNIQUE: Multiplanar, multiecho pulse sequences of the brain and surrounding structures were obtained without intravenous contrast. COMPARISON:  MRI brain 05/22/2013. FINDINGS: Acute nonhemorrhagic 1.5 cm infarct is present in the right cerebellum posteriorly. This likely accounts for the patient's ataxia. Moderate atrophy and periventricular white matter disease is otherwise stable. No acute hemorrhage or mass lesion present. The ventricles are proportionate to the degree of atrophy. Basal ganglia are intact. Dilated perivascular spaces are again noted, particularly in the right caudate head. Flow is present in the major intracranial arteries the internal auditory canals are within normal limits. Bilateral lens replacements are present. The globes and orbits are otherwise intact. The paranasal sinuses are clear. There is some fluid in the mastoid air cells. No obstructing nasopharyngeal lesion is evident. The skullbase is within normal limits. Mild degenerative changes are present in the upper cervical spine. Midline structures are otherwise unremarkable. IMPRESSION: 1. Acute  nonhemorrhagic 1.5 cm infarct within the right cerebellum. 2. Moderate generalized atrophy and white matter disease is otherwise stable. This is compatible with chronic microvascular ischemic change. 3. Mild degenerative changes within the upper cervical spine are also similar to the prior exam. Electronically Signed   By: San Morelle M.D.   On: 10/16/2015 18:52     EKG: Independently reviewed.     Assessment/Plan:      81 y.o. male with  Principal Problem:   1.    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    CORONARY ATHEROSCLEROSIS NATIVE CORONARY ARTERY   Continue ASA, NTG, Atorvastatin, and         3.    Chronic diastolic CHF (congestive heart failure) (HCC)   Stable   Continue lasix, Losartan Rx     4.    Paroxysmal Atrial Fibrillation   Continue Amiodarone and Eliquis Rx     5.    Essential HTN   Continue Lasix, Losartan,       6.    Hypothyroidism   Continue Levothyroxine Rx     7.    DM (diabetes mellitus), type 2, uncontrolled with complications (HCC)   SSI coverage PRN     8.    Hyperlipidemia with target LDL less than 100   Continue Atorvastatin, Zetia, and Omega 3 Fatty Acids  9.    Peripheral vascular disease, unspecified ([place])   Continue Pletal Rx    10.    CKD (chronic kidney disease), stage III   Monitor BUN/Cr    11.    DVT Prophylaxis   Continue Eliquis Rx    Code Status:     FULL CODE       Family Communication:   No Family Present    Disposition Plan:    Inpatient Status        Time spent:   [facility] Pager [phone]   If [place] Please Contact the Day Rounding Team MD for [facility]  If 7PM-7AM, Please Contact Night-Floor Coverage   [email or web address] Password [facility] 10/16/2015, 11:11 PM   Patient was seen and examined on 10/16/2015

## 2015-10-17 ENCOUNTER — Other Ambulatory Visit (HOSPITAL_COMMUNITY): Payer: Medicare PPO

## 2015-10-17 ENCOUNTER — Inpatient Hospital Stay (HOSPITAL_COMMUNITY): Payer: Medicare PPO

## 2015-10-17 DIAGNOSIS — E039 Hypothyroidism, unspecified: Secondary | ICD-10-CM

## 2015-10-17 DIAGNOSIS — I63441 Cerebral infarction due to embolism of right cerebellar artery: Secondary | ICD-10-CM

## 2015-10-17 DIAGNOSIS — N183 Chronic kidney disease, stage 3 (moderate): Secondary | ICD-10-CM

## 2015-10-17 DIAGNOSIS — E1165 Type 2 diabetes mellitus with hyperglycemia: Secondary | ICD-10-CM

## 2015-10-17 DIAGNOSIS — I739 Peripheral vascular disease, unspecified: Secondary | ICD-10-CM

## 2015-10-17 DIAGNOSIS — I5032 Chronic diastolic (congestive) heart failure: Secondary | ICD-10-CM

## 2015-10-17 DIAGNOSIS — I2510 Atherosclerotic heart disease of native coronary artery without angina pectoris: Secondary | ICD-10-CM

## 2015-10-17 DIAGNOSIS — E118 Type 2 diabetes mellitus with unspecified complications: Secondary | ICD-10-CM

## 2015-10-17 DIAGNOSIS — E785 Hyperlipidemia, unspecified: Secondary | ICD-10-CM

## 2015-10-17 LAB — LIPID PANEL
CHOLESTEROL: 126 mg/dL (ref 0–200)
HDL: 45 mg/dL (ref >40)
LDL CALC: 74 mg/dL (ref 0–99)
TRIGLYCERIDES: 33 mg/dL (ref <150)
Total CHOL/HDL Ratio: 2.8 RATIO
VLDL: 7 mg/dL (ref 0–40)

## 2015-10-17 LAB — GLUCOSE, CAPILLARY
GLUCOSE-CAPILLARY: 98 mg/dL (ref 65–99)
Glucose-Capillary: 99 mg/dL (ref 65–99)
Glucose-Capillary: 216 mg/dL — ABNORMAL HIGH (ref 65–99)
Glucose-Capillary: 87 mg/dL (ref 65–99)

## 2015-10-17 MED ORDER — STROKE: EARLY STAGES OF RECOVERY BOOK
Freq: Once | Status: AC
  Administered 2015-10-17: 01:00:00
  Filled 2015-10-17: qty 1

## 2015-10-17 MED ORDER — TAMSULOSIN HCL 0.4 MG PO CAPS
0.4000 mg | ORAL_CAPSULE | Freq: Every day | ORAL | Status: DC
  Administered 2015-10-17 – 2015-10-18 (×2): 0.4 mg via ORAL
  Filled 2015-10-17 (×2): qty 1

## 2015-10-17 MED ORDER — ASPIRIN 81 MG PO CHEW
81.0000 mg | CHEWABLE_TABLET | Freq: Every day | ORAL | Status: DC
  Administered 2015-10-17 – 2015-10-18 (×2): 81 mg via ORAL
  Filled 2015-10-17 (×2): qty 1

## 2015-10-17 MED ORDER — SENNOSIDES-DOCUSATE SODIUM 8.6-50 MG PO TABS: 1.0000 | ORAL_TABLET | Freq: Every evening | ORAL | Status: DC | PRN

## 2015-10-17 MED ORDER — APIXABAN 2.5 MG PO TABS
2.5000 mg | ORAL_TABLET | Freq: Two times a day (BID) | ORAL | Status: DC
  Administered 2015-10-17 – 2015-10-18 (×4): 2.5 mg via ORAL
  Filled 2015-10-17 (×4): qty 1

## 2015-10-17 MED ORDER — LOSARTAN POTASSIUM 50 MG PO TABS
100.0000 mg | ORAL_TABLET | Freq: Every day | ORAL | Status: DC
  Administered 2015-10-17: 100 mg via ORAL
  Filled 2015-10-17: qty 2

## 2015-10-17 MED ORDER — AMIODARONE HCL 200 MG PO TABS
200.0000 mg | ORAL_TABLET | Freq: Every day | ORAL | Status: DC
  Administered 2015-10-17 – 2015-10-18 (×2): 200 mg via ORAL
  Filled 2015-10-17 (×2): qty 1

## 2015-10-17 MED ORDER — ADULT MULTIVITAMIN W/MINERALS CH
1.0000 | ORAL_TABLET | Freq: Every day | ORAL | Status: DC
  Administered 2015-10-17 – 2015-10-18 (×2): 1 via ORAL
  Filled 2015-10-17 (×2): qty 1

## 2015-10-17 MED ORDER — ALPRAZOLAM 0.25 MG PO TABS: 0.2500 mg | ORAL_TABLET | Freq: Two times a day (BID) | ORAL | Status: DC | PRN

## 2015-10-17 MED ORDER — FUROSEMIDE 40 MG PO TABS: 60.0000 mg | ORAL_TABLET | Freq: Every day | ORAL | Status: DC

## 2015-10-17 MED ORDER — ATORVASTATIN CALCIUM 80 MG PO TABS
80.0000 mg | ORAL_TABLET | Freq: Every day | ORAL | Status: DC
  Administered 2015-10-17 – 2015-10-18 (×2): 80 mg via ORAL
  Filled 2015-10-17 (×2): qty 1

## 2015-10-17 MED ORDER — TEMAZEPAM 15 MG PO CAPS: 30.0000 mg | ORAL_CAPSULE | Freq: Every evening | ORAL | Status: DC | PRN

## 2015-10-17 MED ORDER — OMEGA-3-ACID ETHYL ESTERS 1 G PO CAPS
1.0000 g | ORAL_CAPSULE | Freq: Two times a day (BID) | ORAL | Status: DC
  Administered 2015-10-17 – 2015-10-18 (×3): 1 g via ORAL
  Filled 2015-10-17 (×3): qty 1

## 2015-10-17 MED ORDER — POTASSIUM CHLORIDE CRYS ER 20 MEQ PO TBCR
20.0000 meq | EXTENDED_RELEASE_TABLET | Freq: Every day | ORAL | Status: DC
  Administered 2015-10-17 – 2015-10-18 (×2): 20 meq via ORAL
  Filled 2015-10-17 (×2): qty 1

## 2015-10-17 MED ORDER — CILOSTAZOL 50 MG PO TABS
100.0000 mg | ORAL_TABLET | Freq: Two times a day (BID) | ORAL | Status: DC
  Administered 2015-10-17 – 2015-10-18 (×4): 100 mg via ORAL
  Filled 2015-10-17 (×4): qty 2

## 2015-10-17 MED ORDER — EZETIMIBE 10 MG PO TABS
10.0000 mg | ORAL_TABLET | Freq: Every day | ORAL | Status: DC
  Administered 2015-10-17 – 2015-10-18 (×2): 10 mg via ORAL
  Filled 2015-10-17 (×2): qty 1

## 2015-10-17 MED ORDER — FLUOXETINE HCL 20 MG PO CAPS
20.0000 mg | ORAL_CAPSULE | Freq: Every day | ORAL | Status: DC
  Administered 2015-10-17 – 2015-10-18 (×2): 20 mg via ORAL
  Filled 2015-10-17 (×2): qty 1

## 2015-10-17 MED ORDER — FUROSEMIDE 40 MG PO TABS
80.0000 mg | ORAL_TABLET | Freq: Every day | ORAL | Status: DC
  Administered 2015-10-17: 80 mg via ORAL
  Filled 2015-10-17: qty 2

## 2015-10-17 MED ORDER — POLYETHYLENE GLYCOL 3350 17 G PO PACK: 17.0000 g | PACK | Freq: Every day | ORAL | Status: DC | PRN

## 2015-10-17 MED ORDER — NIFEDIPINE ER 30 MG PO TB24
30.0000 mg | ORAL_TABLET | Freq: Every day | ORAL | Status: DC
  Administered 2015-10-17 – 2015-10-18 (×2): 30 mg via ORAL
  Filled 2015-10-17 (×2): qty 1

## 2015-10-17 MED ORDER — ASPIRIN 325 MG PO TABS: 325.0000 mg | ORAL_TABLET | Freq: Every day | ORAL | Status: DC

## 2015-10-17 MED ORDER — NITROGLYCERIN 0.4 MG SL SUBL: 0.4000 mg | SUBLINGUAL_TABLET | SUBLINGUAL | Status: DC | PRN

## 2015-10-17 MED ORDER — ASPIRIN 300 MG RE SUPP: 300.0000 mg | Freq: Every day | RECTAL | Status: DC

## 2015-10-17 NOTE — Progress Notes (Signed)
[facility]                                                                              Patient Demographics  Andrew [name], is a 81 y.o. male, DOB - 10/12/1928, [id number]  Admit date - 10/16/2015   Admitting Physician Theressa Millard, MD  Outpatient Primary MD for the patient is [name], MD  LOS - 1   Chief Complaint  Patient presents with  . Weakness  . Abnormal Lab      HPI on 10/16/2015 by Dr. [name] Andrew [name] is a 81 y.o. male with Multiple medical problems who complaints of having intermittent dizziness, and being unsteady on his feet for the past 3-4 weeks who had an outpatient MRI performed today which revealed an acute nonhemorrhagic infarct of the right cerebellum and was sent to the ED for further evaluation. He denies having any chest pain, or headaches or visual changes. He was seen by Neurology in the ED dr. Leonel Ramsay and was referred for medical admission.   Assessment & Plan   Acute CVA -MRI brain: Acute nonhemorrhagic 1.5 cm infarct in the right cerebellum -Echocardiogram: pending -Carotid doppler: pending -LDL 74 -hemoglobin A1c pending -PT/OT consulted -Neurology consulted and appreciated -Continue Aspirin, statin, Eliquis  Coronary atherosclerosis -Currently stable, continue aspirin, statin  Chronic diastolic heart failure -Currently euvolemic and compensated -Lasix, losartan held -Echocardiogram pending  Paroxysmal  atrial fibrillation -CHADSVASC 8 (based on CVA, age, HTN, CHF, PVD, DM) -Continue amiodarone and Eliquis  Essential hypertension -Hold Lasix and losartan due to AKI  Hypothyroidism -Continue Synthroid  Diabetes mellitus, type II -Hemoglobin A1c pending -Continue insulin sliding scale CBG monitoring  Hyperlipidemia -Continue statin -Lipid panel: TC 126, TG 33, HDL 45, LDL 74  Peripheral vascular disease -Continue Pletal  Acute on Chronic kidney disease, stage III -Creatinine  currently 2.23 -Baseline creatinine 1.8 -Will hold Lasix and losartan  Normocytic anemia, chronic -Hemoglobin currently 9.5, baseline between 8-9 -Continue to monitor CBC  Code Status: Full  Family Communication: None at bedside  Disposition Plan: Admitted. Pending workup  Time Spent in minutes   30 minutes  Procedures  None  Consults   Neurology  DVT Prophylaxis  Eliquis  Lab Results  Component Value Date   PLT 211 10/16/2015    Medications  Scheduled Meds: . amiodarone  200 mg Oral Daily  . apixaban  2.5 mg Oral BID  . aspirin  81 mg Oral Daily  . atorvastatin  80 mg Oral Daily  . cilostazol  100 mg Oral BID  . ezetimibe  10 mg Oral Daily  . FLUoxetine  20 mg Oral Daily  . furosemide  60 mg Oral q1800  . furosemide  80 mg Oral Q breakfast  . insulin aspart  0-5 Units Subcutaneous QHS  . insulin aspart  0-9 Units Subcutaneous TID WC  . losartan  100 mg Oral Daily  . multivitamin with minerals  1 tablet Oral Daily  . NIFEdipine  30 mg Oral Daily  . omega-3 acid ethyl esters  1 g Oral BID  . potassium chloride SA  20 mEq Oral Daily  . tamsulosin  0.4 mg Oral Daily  Continuous Infusions:  PRN Meds:.ALPRAZolam, nitroGLYCERIN, polyethylene glycol, senna-docusate, temazepam  Antibiotics    Anti-infectives    None      Subjective:   Andrew [name] seen and examined today.  Patient denies pain at this time. States he has been unsteady for "a while."  Denies chest pain, shortness of breath, abdominal pain.   Objective:   Filed Vitals:   10/17/15 0405 10/17/15 0600 10/17/15 0800 10/17/15 0933  BP: 141/62 148/66 130/58 144/66  Pulse: 74 75    Temp: 98.2 F (36.8 C) 98.2 F (36.8 C)  98.1 F (36.7 C)  TempSrc: Oral Oral  Oral  Resp: 20 18  18   Height:      Weight:      SpO2: 97% 97% 97% 97%    Wt Readings from Last 3 Encounters:  10/17/15 80.797 kg (178 lb 2 oz)  10/12/15 81.647 kg (180 lb)  10/02/15 80.105 kg (176 lb 9.6 oz)      Intake/Output Summary (Last 24 hours) at 10/17/15 1115 Last data filed at 10/17/15 0100  Gross per 24 hour  Intake    230 ml  Output    650 ml  Net   -420 ml    Exam  General: Well developed, well nourished, NAD, appears stated age  HEENT: NCAT,  mucous membranes moist.   Cardiovascular: S1 S2 auscultated, no rubs, murmurs or gallops. IRR  Respiratory: Clear to auscultation bilaterally with equal chest rise  Abdomen: Soft, nontender, nondistended, + bowel sounds  Extremities: warm dry without cyanosis clubbing or edema.  Healing cellulitis/wounds on LE anteriorly   Neuro: AAOx3, nonfocal  Psych: Normal affect and demeanor with intact judgement and insight  Data Review   Micro Results No results found for this or any previous visit (from the past 240 hour(s)).  Radiology Reports Mr [name] Contrast  10/16/2015  CLINICAL DATA:  Initial evaluation for acute stroke, abnormal MRI performed earlier today. Patient has been unsteady on feet for past few months. EXAM: MRA HEAD WITHOUT CONTRAST TECHNIQUE: Angiographic images of the Circle of Willis were obtained using MRA technique without intravenous contrast. COMPARISON:  Prior MRI from earlier the same day. FINDINGS: ANTERIOR CIRCULATION: Visualized distal cervical segments of the internal carotid arteries are widely patent with antegrade flow. The petrous, cavernous, and supraclinoid segments are widely patent. A1 segments well opacified bilaterally. Anterior communicating artery normal. Anterior cerebral arteries widely patent. M1 segments well opacified without focal stenosis or occlusion. MCA bifurcations are normal. Distal MCA branches are well opacified and symmetric bilaterally. POSTERIOR CIRCULATION: The right vertebral artery is dominant and widely patent to the vertebrobasilar junction. The left vertebral artery is diminutive with attenuated flow with multi focal irregularity. The diminutive left vertebral artery  divides at the takeoff of the left posterior inferior cerebral artery with a smaller hypoplastic branch ascending towards the vertebrobasilar junction. The left posterior inferior cerebellar artery itself is opacified. The right posterior inferior cerebral artery is not visualized. Basilar artery is widely patent. There appears to be a dominant right anterior inferior cerebral artery. Basilar tip is somewhat ectatic without focal aneurysm. Superior cerebellar arteries patent bilaterally. Both posterior cerebral arteries arise in the basilar artery and are well opacified to their distal aspects. No aneurysm or vascular malformation. IMPRESSION: 1. Diminutive and attenuated left vertebral artery, which does remain patent to the vertebrobasilar junction. The dominant right vertebral artery is widely patent without focal stenosis or other acute abnormality. 2. Nonvisualization of the right posterior inferior cerebral  artery. There does appear to be a dominant right anterior inferior cerebral artery. 3. Widely patent anterior circulation. 4. Mild ectasia of the basilar tip without focal aneurysm. Electronically Signed   By: Jeannine Boga M.D.   On: 10/16/2015 23:44   Mr Brain Wo Contrast  10/16/2015  CLINICAL DATA:  Hereditary neuropathy. Ataxia over the last few months. EXAM: MRI HEAD WITHOUT CONTRAST TECHNIQUE: Multiplanar, multiecho pulse sequences of the brain and surrounding structures were obtained without intravenous contrast. COMPARISON:  MRI brain 05/22/2013. FINDINGS: Acute nonhemorrhagic 1.5 cm infarct is present in the right cerebellum posteriorly. This likely accounts for the patient's ataxia. Moderate atrophy and periventricular white matter disease is otherwise stable. No acute hemorrhage or mass lesion present. The ventricles are proportionate to the degree of atrophy. Basal ganglia are intact. Dilated perivascular spaces are again noted, particularly in the right caudate head. Flow is present  in the major intracranial arteries the internal auditory canals are within normal limits. Bilateral lens replacements are present. The globes and orbits are otherwise intact. The paranasal sinuses are clear. There is some fluid in the mastoid air cells. No obstructing nasopharyngeal lesion is evident. The skullbase is within normal limits. Mild degenerative changes are present in the upper cervical spine. Midline structures are otherwise unremarkable. IMPRESSION: 1. Acute nonhemorrhagic 1.5 cm infarct within the right cerebellum. 2. Moderate generalized atrophy and white matter disease is otherwise stable. This is compatible with chronic microvascular ischemic change. 3. Mild degenerative changes within the upper cervical spine are also similar to the prior exam. Electronically Signed   By: San Morelle M.D.   On: 10/16/2015 18:52   US Aspiration  09/17/2015  CLINICAL DATA:  81 year old with symptomatic right renal cyst. Patient complains of occasional pain and discomfort in the right flank which we believe is related to a parapelvic cyst. Patient had aspiration of this cyst on 08/19/2015 with temporary relief. He is having occasional symptoms again. EXAM: ULTRASOUND-GUIDED ASPIRATION OF RIGHT RENAL CYST Physician: Stephan Minister. Anselm Pancoast, MD FLUOROSCOPY TIME:  None MEDICATIONS: 0.5 mg Versed, 25 mcg fentanyl. A radiology nurse monitored the patient for moderate sedation. ANESTHESIA/SEDATION: Moderate sedation time: 10 minutes PROCEDURE: Patient was referred to intervention Radiology for sclerotherapy of this renal cyst. I discussed sclerotherapy of this renal cyst with the patient in depth. The parapelvic cyst appeared to be complex fluid during the previous aspiration. I explained to the patient that he would most likely need a percutaneous drain for few days before or after sclerotherapy to optimize treatment and for safety. After discussing placement of a drainage catheter, the patient preferred to undergo only  an aspiration procedure. The patient was placed on his left side. A large hypoechoic structure with internal echoes was identified in the right parapelvic region. This correspond with the previously aspirated cyst. Skin was prepped and draped in a sterile fashion. Skin was anesthetized with 1% lidocaine. 10 cm Yueh catheter was directed this collection with ultrasound guidance from an inferior approach, without going through renal parenchyma. Catheter placement confirmed within this cyst. 250 mL of cloudy brown/red fluid was removed. Yueh catheter was removed. Bandage placed over the puncture site. FINDINGS: Large round right parapelvic cyst measuring 9.6 x 7.8 x 9.6 cm. This structure has internal echoes. 250 mL of brown/red cloudy fluid was removed. After most of the fluid was removed, the cyst walls appeared thickened and slightly irregular. There is an adjacent anechoic cyst in the right kidney. Estimated blood loss: Minimal COMPLICATIONS: None IMPRESSION: Successful ultrasound-guided  aspiration of the complex right parapelvic cyst. 250 mL of fluid removed. This fluid was again sent for culture. Patient will contact us if he has recurrent symptoms from this renal cyst in the future. Based on the appearance of this cyst and the material which has been aspirated, I explained to the patient that he may be a better candidate for aspiration only rather than sclerotherapy. Electronically Signed   By: Markus Daft M.D.   On: 09/17/2015 18:09    CBC  Recent Labs Lab 10/16/15 2045  WBC 6.3  HGB 9.5*  HCT 29.4*  PLT 211  MCV 94.5  MCH 30.5  MCHC 32.3  RDW 15.3  LYMPHSABS 1.2  MONOABS 0.5  EOSABS 0.2  BASOSABS 0.0    Chemistries   Recent Labs Lab 10/12/15 1058 10/16/15 2045  NA 141 135  K 4.5 4.2  CL 107 100*  CO2 27 25  GLUCOSE 113* 90  BUN 40* 51*  CREATININE 1.79* 2.23*  CALCIUM 9.5 9.0    ------------------------------------------------------------------------------------------------------------------ estimated creatinine clearance is 24.2 mL/min (by C-G formula based on Cr of 2.23). ------------------------------------------------------------------------------------------------------------------ No results for input(s): HGBA1C in the last 72 hours. ------------------------------------------------------------------------------------------------------------------  Recent Labs  10/17/15 0413  CHOL 126  HDL 45  LDLCALC 74  TRIG 33  CHOLHDL 2.8   ------------------------------------------------------------------------------------------------------------------ No results for input(s): TSH, T4TOTAL, T3FREE, THYROIDAB in the last 72 hours.  Invalid input(s): FREET3 ------------------------------------------------------------------------------------------------------------------ No results for input(s): VITAMINB12, FOLATE, FERRITIN, TIBC, IRON, RETICCTPCT in the last 72 hours.  Coagulation profile No results for input(s): INR, PROTIME in the last 168 hours.  No results for input(s): DDIMER in the last 72 hours.  Cardiac Enzymes No results for input(s): CKMB, TROPONINI, MYOGLOBIN in the last 168 hours.  Invalid input(s): CK ------------------------------------------------------------------------------------------------------------------ Invalid input(s): POCBNP    Shanquita Ronning D.O. on 10/17/2015 at 11:15 AM  Between 7am to 7pm - Pager - [phone]  After 7pm go to [email or web address] - password [email or web address]  And look for the night coverage person covering for me after hours  [facility] Office  [phone]

## 2015-10-17 NOTE — Progress Notes (Signed)
Stroke Team Progress Note  HISTORY  Andrew [name] is a 81 y.o. male with a history of several months with ataxia for which she was evaluated by Dr. Tomi [name] earlier this month. Dr. Tomi [name] ordered an MRI to further evaluate the patient which was performed today and showed an acute cerebellar infarct. The patient's symptoms are not clearly correlated with this infarct as they have been going on for quite some time, and he feels that he might even be slightly better now than he was at the time of his evaluation by Dr. Tomi [name].  He saw Dr. Tomi [name] not because his symptoms were necessarily worsening, but because he did have a fall. He points to the middle of the summer as when his symptoms started.  He has not noticed any changes over the past few days to weeks and denies vertigo, worsening gait problems, double vision. He does notice that he might of had a slight increase in clumsiness while playing the piano over the past day or 2.  On review of symptoms he does note that he has had significant increase in sleepiness over the past few months.  LKW: Unknown tpa given?: no, unknown time of onset  SUBJECTIVE No family is at bedside. The patient is alert and cooperative, no significant complaints at this time.  OBJECTIVE Most recent Vital Signs: Filed Vitals:   10/17/15 0405 10/17/15 0600 10/17/15 0800 10/17/15 0933  BP: 141/62 148/66 130/58 144/66  Pulse: 74 75    Temp: 98.2 F (36.8 C) 98.2 F (36.8 C)  98.1 F (36.7 C)  TempSrc: Oral Oral  Oral  Resp: 20 18  18   Height:      Weight:      SpO2: 97% 97% 97% 97%   CBG (last 3)   Recent Labs  10/16/15 2349 10/17/15 0631  GLUCAP 92 99    IV Fluid Intake:     MEDICATIONS  . amiodarone  200 mg Oral Daily  . apixaban  2.5 mg Oral BID  . aspirin  81 mg Oral Daily  . atorvastatin  80 mg Oral Daily  . cilostazol  100 mg Oral BID  . ezetimibe  10 mg Oral Daily  . FLUoxetine  20 mg Oral Daily  . furosemide  60 mg Oral q1800  .  furosemide  80 mg Oral Q breakfast  . insulin aspart  0-5 Units Subcutaneous QHS  . insulin aspart  0-9 Units Subcutaneous TID WC  . losartan  100 mg Oral Daily  . multivitamin with minerals  1 tablet Oral Daily  . NIFEdipine  30 mg Oral Daily  . omega-3 acid ethyl esters  1 g Oral BID  . potassium chloride SA  20 mEq Oral Daily  . tamsulosin  0.4 mg Oral Daily   PRN:  ALPRAZolam, nitroGLYCERIN, polyethylene glycol, senna-docusate, temazepam  Diet:  Diet Heart Room service appropriate?: Yes; Fluid consistency:: Thin  liquids Activity:  Up with assistance DVT Prophylaxis:  apixaban  CLINICALLY SIGNIFICANT STUDIES Basic Metabolic Panel:  Recent Labs Lab 10/12/15 1058 10/16/15 2045  NA 141 135  K 4.5 4.2  CL 107 100*  CO2 27 25  GLUCOSE 113* 90  BUN 40* 51*  CREATININE 1.79* 2.23*  CALCIUM 9.5 9.0   Liver Function Tests: No results for input(s): AST, ALT, ALKPHOS, BILITOT, PROT, ALBUMIN in the last 168 hours. CBC:  Recent Labs Lab 10/16/15 2045  WBC 6.3  NEUTROABS 4.3  HGB 9.5*  HCT 29.4*  MCV 94.5  PLT  211   Coagulation: No results for input(s): LABPROT, INR in the last 168 hours. Cardiac Enzymes: No results for input(s): CKTOTAL, CKMB, CKMBINDEX, TROPONINI in the last 168 hours. Urinalysis: No results for input(s): COLORURINE, LABSPEC, PHURINE, GLUCOSEU, HGBUR, BILIRUBINUR, KETONESUR, PROTEINUR, UROBILINOGEN, NITRITE, LEUKOCYTESUR in the last 168 hours.  Invalid input(s): APPERANCEUR Lipid Panel    Component Value Date/Time   CHOL 126 10/17/2015 0413   TRIG 33 10/17/2015 0413   HDL 45 10/17/2015 0413   CHOLHDL 2.8 10/17/2015 0413   VLDL 7 10/17/2015 0413   LDLCALC 74 10/17/2015 0413   HgbA1C  Lab Results  Component Value Date   HGBA1C 6.4 03/23/2015    Urine Drug Screen:  No results found for: LABOPIA, COCAINSCRNUR, LABBENZ, AMPHETMU, THCU, LABBARB  Alcohol Level: No results for input(s): ETH in the last 168 hours.  Mr Andrew [name] Wo  Contrast  10/16/2015  CLINICAL DATA:  Initial evaluation for acute stroke, abnormal MRI performed earlier today. Patient has been unsteady on feet for past few months. EXAM: MRA HEAD WITHOUT CONTRAST TECHNIQUE: Angiographic images of the Circle of [place] were obtained using MRA technique without intravenous contrast. COMPARISON:  Prior MRI from earlier the same day. FINDINGS: ANTERIOR CIRCULATION: Visualized distal cervical segments of the internal carotid arteries are widely patent with antegrade flow. The petrous, cavernous, and supraclinoid segments are widely patent. A1 segments well opacified bilaterally. Anterior communicating artery normal. Anterior cerebral arteries widely patent. M1 segments well opacified without focal stenosis or occlusion. MCA bifurcations are normal. Distal MCA branches are well opacified and symmetric bilaterally. POSTERIOR CIRCULATION: The right vertebral artery is dominant and widely patent to the vertebrobasilar junction. The left vertebral artery is diminutive with attenuated flow with multi focal irregularity. The diminutive left vertebral artery divides at the takeoff of the left posterior inferior cerebral artery with a smaller hypoplastic branch ascending towards the vertebrobasilar junction. The left posterior inferior cerebellar artery itself is opacified. The right posterior inferior cerebral artery is not visualized. Basilar artery is widely patent. There appears to be a dominant right anterior inferior cerebral artery. Basilar tip is somewhat ectatic without focal aneurysm. Superior cerebellar arteries patent bilaterally. Both posterior cerebral arteries arise in the basilar artery and are well opacified to their distal aspects. No aneurysm or vascular malformation. IMPRESSION: 1. Diminutive and attenuated left vertebral artery, which does remain patent to the vertebrobasilar junction. The dominant right vertebral artery is widely patent without focal stenosis or other  acute abnormality. 2. Nonvisualization of the right posterior inferior cerebral artery. There does appear to be a dominant right anterior inferior cerebral artery. 3. Widely patent anterior circulation. 4. Mild ectasia of the basilar tip without focal aneurysm. Electronically Signed   By: Jeannine Boga M.D.   On: 10/16/2015 23:44   Mr Brain Wo Contrast  10/16/2015  CLINICAL DATA:  Hereditary neuropathy. Ataxia over the last few months. EXAM: MRI HEAD WITHOUT CONTRAST TECHNIQUE: Multiplanar, multiecho pulse sequences of the brain and surrounding structures were obtained without intravenous contrast. COMPARISON:  MRI brain 05/22/2013. FINDINGS: Acute nonhemorrhagic 1.5 cm infarct is present in the right cerebellum posteriorly. This likely accounts for the patient's ataxia. Moderate atrophy and periventricular white matter disease is otherwise stable. No acute hemorrhage or mass lesion present. The ventricles are proportionate to the degree of atrophy. Basal ganglia are intact. Dilated perivascular spaces are again noted, particularly in the right caudate head. Flow is present in the major intracranial arteries the internal auditory canals are within normal limits. Bilateral  lens replacements are present. The globes and orbits are otherwise intact. The paranasal sinuses are clear. There is some fluid in the mastoid air cells. No obstructing nasopharyngeal lesion is evident. The skullbase is within normal limits. Mild degenerative changes are present in the upper cervical spine. Midline structures are otherwise unremarkable. IMPRESSION: 1. Acute nonhemorrhagic 1.5 cm infarct within the right cerebellum. 2. Moderate generalized atrophy and white matter disease is otherwise stable. This is compatible with chronic microvascular ischemic change. 3. Mild degenerative changes within the upper cervical spine are also similar to the prior exam. Electronically Signed   By: San Morelle M.D.   On: 10/16/2015  18:52      MRI of the brain   IMPRESSION: 1. Acute nonhemorrhagic 1.5 cm infarct within the right cerebellum. 2. Moderate generalized atrophy and white matter disease is otherwise stable. This is compatible with chronic microvascular ischemic change. 3. Mild degenerative changes within the upper cervical spine are also similar to the prior exam. MRA of the brain   IMPRESSION: 1. Diminutive and attenuated left vertebral artery, which does remain patent to the vertebrobasilar junction. The dominant right vertebral artery is widely patent without focal stenosis or other acute abnormality. 2. Nonvisualization of the right posterior inferior cerebral artery. There does appear to be a dominant right anterior inferior cerebral artery. 3. Widely patent anterior circulation. 4. Mild ectasia of the basilar tip without focal aneurysm.  2D Echocardiogram  pending  Carotid Doppler  pending  EKG   Sinus rhythm with 1st degree A-V block Left axis deviation Non-specific intra-ventricular conduction block Cannot rule out Anterior infarct , age undetermined Abnormal ECG   Therapy Recommendations pending  Physical Exam  General: The patient is alert and cooperative at the time of the examination.  Respiratory: Lung fields are clear  Cardiovascular examination: Irregular heart rhythm, no murmurs  Skin: No significant peripheral edema is noted.   Neurologic Exam  Mental status: The patient is alert and oriented x 3 at the time of the examination. The patient has apparent normal recent and remote memory, with an apparently normal attention span and concentration ability.   Cranial nerves: Facial symmetry is present. Speech is normal, no aphasia or dysarthria is noted. Extraocular movements are full. Visual fields are full.  Motor: The patient has good strength in all 4 extremities.  Sensory examination: Soft touch sensation is symmetric on the face, arms, and legs.  Coordination:  The patient has good finger-nose-finger and heel-to-shin bilaterally.  Gait and station: The gait was not tested.  Reflexes: Deep tendon reflexes are symmetric.    ASSESSMENT [name] is a 81 y.o. male presenting with right cerebellar stroke, no definite clinical correlate. The patient was being evaluated for a pre-existing history of gait instability, he noted some right arm discomfort the day prior to admission, no definite change in coordination, gait change, headache, slurred speech, double vision, syncope. The patient was on Elavil requests and low-dose aspirin 81 mg prior to admission. He has a history of atrial fibrillation, diabetes, coronary artery disease, dyslipidemia, and hypertension.   Right cerebellar stroke  Atrial fibrillation  Coronary artery disease  Left ventricular failure  History of carotid artery stenosis  Hypertension  Diabetes  History of congestive heart failure  History of deep venous thrombosis  Hospital day # 1  This patient has multiple risk factors for stroke, the cerebellar stroke event is with unknown time of onset, patient was not a TPA candidate. The patient has minimal deficits  from this, undergoing some workup. He is already on maximal medical therapy. He likely will not require any physical occupational therapy.  TREATMENT/PLAN  No change medical therapy, the patient is on antiplatelet agents and anticoagulant medication at this time  2-D echocardiogram pending  Carotid Doppler study  Hemoglobin A1c pending  LDL 74, patient is on Zetia and Lipitor  Physical, occupational therapy evaluation  Mobilize patient  10/17/2015 10:40 AM  Lenor Coffin Phone [phone]

## 2015-10-17 NOTE — Evaluation (Signed)
Physical Therapy Evaluation and Discharge Patient Details Name: Andrew [name] MRN: [id number] DOB: [birth year] Today's Date: 10/17/2015   History of Present Illness  adm after 4 week h/o decreasing balance, with OP MRI showed acute Rt cerebellar CVA PMHx-pt reports since August hospitalization in [place] for CHF, he had noted declining strength/balance resulting in fall in his yard and could not get up (down 1.5 hrs with severe sunburn to bil lower legs and then cellulitis), a second fall prompted MD to order HHPT and pt had been improving until past 4 weeks    Clinical Impression  Patient evaluated by Physical Therapy with no further acute PT needs identified. All education has been completed and the patient has no further questions.  All further needs can be met by HHPT (and we discussed possible transition to OPPT when appropriate). PT is signing off. Thank you for this referral.     Follow Up Recommendations Home health PT    Equipment Recommendations  None recommended by PT    Recommendations for Other Services OT consult     Precautions / Restrictions Precautions Precautions: Fall      Mobility  Bed Mobility Overal bed mobility: Modified Independent             General bed mobility comments: hob flat, no rail, incr effort and 2 tries (reports this is his baseline)  Transfers Overall transfer level: Modified independent Equipment used: Straight cane;None             General transfer comment: x 6 throughout session  Ambulation/Gait Ambulation/Gait assistance: Supervision;Min guard Ambulation Distance (Feet): 400 Feet Assistive device: Straight cane;None Gait Pattern/deviations: Step-through pattern;Decreased stride length;Wide base of support Gait velocity: WFL   General Gait Details: minguard with higher level gait tasks; supervision with/without cane level surfaces  Stairs Stairs: Yes Stairs assistance: Min guard Stair Management: One rail  Left;Alternating pattern;Step to pattern;Sideways;Forwards Number of Stairs: 6 General stair comments: pt ascends forwards; descends sideways  Wheelchair Mobility    Modified Rankin (Stroke Patients Only) Modified Rankin (Stroke Patients Only) Pre-Morbid Rankin Score: No significant disability Modified Rankin: Slight disability     Balance Overall balance assessment: Needs assistance                           High level balance activites: Direction changes;Turns;Sudden stops;Head turns High Level Balance Comments: no imbalance with high level tasks except when he tried to turn head to Lt and turned entire shoulders/torso with LOB; when corrected and only turned head, he maintained balance Standardized Balance Assessment Standardized Balance Assessment : Berg Balance Test Berg Balance Test Sit to Stand: Able to stand  independently using hands Standing Unsupported: Able to stand safely 2 minutes Sitting with Back Unsupported but Feet Supported on Floor or Stool: Able to sit safely and securely 2 minutes Stand to Sit: Sits safely with minimal use of hands Transfers: Able to transfer safely, minor use of hands Standing Unsupported with Eyes Closed: Able to stand 10 seconds with supervision Standing Ubsupported with Feet Together: Able to place feet together independently but unable to hold for 30 seconds From Standing, Reach Forward with Outstretched Arm: Can reach forward >12 cm safely (5") From Standing Position, Pick up Object from Floor: Able to pick up shoe, needs supervision From Standing Position, Turn to Look Behind Over each Shoulder: Turn sideways only but maintains balance Turn 360 Degrees: Able to turn 360 degrees safely one side only in 4 seconds  or less Standing Unsupported, Alternately Place Feet on Step/Stool: Able to complete >2 steps/needs minimal assist Standing Unsupported, One Foot in Front: Able to plae foot ahead of the other independently and hold 30  seconds Standing on One Leg: Tries to lift leg/unable to hold 3 seconds but remains standing independently Total Score: 40         Pertinent Vitals/Pain Pain Assessment: No/denies pain    Home Living Family/patient expects to be discharged to:: Private residence Living Arrangements: Spouse/significant other Available Help at Discharge: Family;Available 24 hours/day Type of Home: House Home Access: Stairs to enter Entrance Stairs-Rails: Right;Left;Can reach both Entrance Stairs-Number of Steps: 6 Home Layout: Bed/bath upstairs;Multi-level Home Equipment: Cane - single point;Grab bars - tub/shower      Prior Function Level of Independence: Independent with assistive device(s)         Comments: walked with cane at times; ~4 wks declining balance with 2 falls; began using personal trainer     Hand Dominance   Dominant Hand: Right    Extremity/Trunk Assessment   Upper Extremity Assessment: Defer to OT evaluation           Lower Extremity Assessment: RLE deficits/detail      Cervical / Trunk Assessment: Kyphotic  Communication   Communication: HOH  Cognition Arousal/Alertness: Awake/alert Behavior During Therapy: WFL for tasks assessed/performed Overall Cognitive Status: Within Functional Limits for tasks assessed                      General Comments General comments (skin integrity, edema, etc.): Pt with excellent questions re: f/u HHPT vs OPPT. Pt with excellent safety awareness and is working hard to prevent another fall.    Exercises Other Exercises Other Exercises: Discussed how to progress standing exercises the HHPT had given him (lessening UE support/feedback)      Assessment/Plan    PT Assessment All further PT needs can be met in the next venue of care  PT Diagnosis Difficulty walking   PT Problem List Decreased balance;Decreased mobility  PT Treatment Interventions     PT Goals ([name] goals can be found in the Care Plan section)  Acute Rehab PT Goals PT Goal Formulation: All assessment and education complete, DC therapy    Frequency     Barriers to discharge        Co-evaluation               End of Session Equipment Utilized During Treatment: Gait belt Activity Tolerance: Patient tolerated treatment well Patient left: in chair;with call bell/phone within reach;with chair alarm set Nurse Communication: Mobility status;Other (comment) (consider sign-off sheet)         Time: 1350-1500 PT Time Calculation (min) (ACUTE ONLY): 70 min   Charges:   PT Evaluation $Initial PT Evaluation Tier I: 1 Procedure PT Treatments $Gait Training: 38-52 mins $Therapeutic Exercise: 8-22 mins   PT G Codes:        , 11-10-15, 3:28 PM Pager [phone]

## 2015-10-17 NOTE — Progress Notes (Signed)
OT Cancellation Note  Patient Details Name: Andrew [name] MRN: [id number] DOB: February 15, 1928   Cancelled Treatment:    Reason Eval/Treat Not Completed: Patient at procedure or test/ unavailable (in room Doppler)  Britt Bottom 10/17/2015, 11:59 AM

## 2015-10-17 NOTE — Progress Notes (Signed)
*  PRELIMINARY RESULTS* Vascular Ultrasound Carotid Duplex (Doppler) has been completed.  Preliminary findings: Bilateral 1-39% ICA stenosis, based on diastolic velocity. 40-59% ICA stenosis, based on systolic velocity and ratio.   Landry Mellow, RDMS, RVT  10/17/2015, 12:28 PM

## 2015-10-17 NOTE — Progress Notes (Signed)
Pt admitted from ED with stroke diagnosis, alert and oriented, denies any pain ,pt settled in bed with tele monitor on, call light within pt's reach, will however continue to monitor. [place], Akelia Husted Efe

## 2015-10-18 ENCOUNTER — Inpatient Hospital Stay (HOSPITAL_COMMUNITY): Payer: Medicare PPO

## 2015-10-18 DIAGNOSIS — N179 Acute kidney failure, unspecified: Secondary | ICD-10-CM

## 2015-10-18 DIAGNOSIS — I6789 Other cerebrovascular disease: Secondary | ICD-10-CM

## 2015-10-18 LAB — CBC
HCT: 29.8 % — ABNORMAL LOW (ref 39.0–52.0)
Hemoglobin: 9.4 g/dL — ABNORMAL LOW (ref 13.0–17.0)
MCH: 29.7 pg (ref 26.0–34.0)
MCHC: 31.5 g/dL (ref 30.0–36.0)
MCV: 94.3 fL (ref 78.0–100.0)
PLATELETS: 191 10*3/uL (ref 150–400)
RBC: 3.16 MIL/uL — AB (ref 4.22–5.81)
RDW: 14.9 % (ref 11.5–15.5)
WBC: 6.5 10*3/uL (ref 4.0–10.5)

## 2015-10-18 LAB — BASIC METABOLIC PANEL
Anion gap: 9 (ref 5–15)
BUN: 35 mg/dL — AB (ref 6–20)
CHLORIDE: 102 mmol/L (ref 101–111)
CO2: 26 mmol/L (ref 22–32)
CREATININE: 1.7 mg/dL — AB (ref 0.61–1.24)
Calcium: 8.9 mg/dL (ref 8.9–10.3)
GFR calc Af Amer: 41 mL/min — ABNORMAL LOW (ref >60)
GFR calc non Af Amer: 35 mL/min — ABNORMAL LOW (ref >60)
GLUCOSE: 99 mg/dL (ref 65–99)
POTASSIUM: 4 mmol/L (ref 3.5–5.1)
SODIUM: 137 mmol/L (ref 135–145)

## 2015-10-18 LAB — GLUCOSE, CAPILLARY
GLUCOSE-CAPILLARY: 106 mg/dL — AB (ref 65–99)
GLUCOSE-CAPILLARY: 204 mg/dL — AB (ref 65–99)

## 2015-10-18 NOTE — Care Management Note (Addendum)
Case Management Note  Patient Details  Name: Andrew [name] MRN: [id number] Date of Birth: 1928/12/13  Subjective/Objective:                  Chief Complaint: Unsteady on his feet  DX: Acute CVA  Action/Plan: CM spoke to patient at the bedside re: discharge planning and need for HHPT, RN, and ST. CM offered patient choice and pt would like to use [facility] PT, RN, and ST as he has used them before and was using prior to admission so only needed resumption. CM called and spoke with [name] for [facility] to advise of referral and referral accepted. Pt states that he lives at home with his spouse and is well supported. Pt denies DME needs. Has walker and cane at home along with grab bars in the shower. No difficulty obtaining medications per pt. CM remains available for further discharge planning needs.   Expected Discharge Date:  10/18/15               Expected Discharge Plan:  [place]  In-House Referral:     Discharge planning Services  CM Consult  Post Acute Care Choice:    Choice offered to:  Patient  DME Arranged:    DME Agency:     HH Arranged:  PT [place]:  [place]  Status of Service:  Completed, signed off  Medicare Important Message Given:    Date Medicare IM Given:    Medicare IM give by:    Date Additional Medicare IM Given:    Additional Medicare Important Message give by:     If discussed at [place] of Stay Meetings, dates discussed:    Additional Comments:  Guido Sander, RN 10/18/2015, 2:12 PM

## 2015-10-18 NOTE — Progress Notes (Signed)
Stroke Team Progress Note  HISTORY  Andrew [name] is a 81 y.o. male with a history of several months with ataxia for which she was evaluated by Dr. Tomi Likens earlier this month. Dr. Tomi Likens ordered an MRI to further evaluate the patient which was performed today and showed an acute cerebellar infarct. The patient's symptoms are not clearly correlated with this infarct as they have been going on for quite some time, and he feels that he might even be slightly better now than he was at the time of his evaluation by Dr. Tomi Likens.  He saw Dr. Tomi Likens not because his symptoms were necessarily worsening, but because he did have a fall. He points to the middle of the summer as when his symptoms started.  He has not noticed any changes over the past few days to weeks and denies vertigo, worsening gait problems, double vision. He does notice that he might of had a slight increase in clumsiness while playing the piano over the past day or 2.  On review of symptoms he does note that he has had significant increase in sleepiness over the past few months.  LKW: Unknown tpa given?: no, unknown time of onset  SUBJECTIVE No family is at bedside. The patient is alert and cooperative, no significant complaints at this time. The patient was ambulating yesterday, no significant issues.  OBJECTIVE Most recent Vital Signs: Filed Vitals:   10/17/15 2145 10/18/15 0119 10/18/15 0549 10/18/15 0919  BP: 149/66 141/64 147/67 125/62  Pulse: 67 86 84 85  Temp: 98.3 F (36.8 C) 98.1 F (36.7 C) 98.3 F (36.8 C) 98.1 F (36.7 C)  TempSrc: Oral Oral Oral Oral  Resp: 20 18 20 20   Height:      Weight:      SpO2: 96% 94% 94% 92%   CBG (last 3)   Recent Labs  10/17/15 1614 10/17/15 2140 10/18/15 0623  GLUCAP 216* 87 106*    IV Fluid Intake:     MEDICATIONS  . amiodarone  200 mg Oral Daily  . apixaban  2.5 mg Oral BID  . aspirin  81 mg Oral Daily  . atorvastatin  80 mg Oral Daily  . cilostazol  100 mg Oral BID   . ezetimibe  10 mg Oral Daily  . FLUoxetine  20 mg Oral Daily  . insulin aspart  0-5 Units Subcutaneous QHS  . insulin aspart  0-9 Units Subcutaneous TID WC  . multivitamin with minerals  1 tablet Oral Daily  . NIFEdipine  30 mg Oral Daily  . omega-3 acid ethyl esters  1 g Oral BID  . potassium chloride SA  20 mEq Oral Daily  . tamsulosin  0.4 mg Oral Daily   PRN:  ALPRAZolam, nitroGLYCERIN, polyethylene glycol, senna-docusate, temazepam  Diet:  Diet Heart Room service appropriate?: Yes; Fluid consistency:: Thin  liquids Activity:  Up with assistance DVT Prophylaxis:  apixaban  CLINICALLY SIGNIFICANT STUDIES Basic Metabolic Panel:   Recent Labs Lab 10/16/15 2045 10/18/15 0512  NA 135 137  K 4.2 4.0  CL 100* 102  CO2 25 26  GLUCOSE 90 99  BUN 51* 35*  CREATININE 2.23* 1.70*  CALCIUM 9.0 8.9   Liver Function Tests: No results for input(s): AST, ALT, ALKPHOS, BILITOT, PROT, ALBUMIN in the last 168 hours. CBC:   Recent Labs Lab 10/16/15 2045 10/18/15 0512  WBC 6.3 6.5  NEUTROABS 4.3  --   HGB 9.5* 9.4*  HCT 29.4* 29.8*  MCV 94.5 94.3  PLT  211 191   Coagulation: No results for input(s): LABPROT, INR in the last 168 hours. Cardiac Enzymes: No results for input(s): CKTOTAL, CKMB, CKMBINDEX, TROPONINI in the last 168 hours. Urinalysis: No results for input(s): COLORURINE, LABSPEC, PHURINE, GLUCOSEU, HGBUR, BILIRUBINUR, KETONESUR, PROTEINUR, UROBILINOGEN, NITRITE, LEUKOCYTESUR in the last 168 hours.  Invalid input(s): APPERANCEUR Lipid Panel    Component Value Date/Time   CHOL 126 10/17/2015 0413   TRIG 33 10/17/2015 0413   HDL 45 10/17/2015 0413   CHOLHDL 2.8 10/17/2015 0413   VLDL 7 10/17/2015 0413   LDLCALC 74 10/17/2015 0413   HgbA1C  Lab Results  Component Value Date   HGBA1C 6.4 03/23/2015    Urine Drug Screen:  No results found for: LABOPIA, COCAINSCRNUR, LABBENZ, AMPHETMU, THCU, LABBARB  Alcohol Level: No results for input(s): ETH in the last 168  hours.  Mr [name] Contrast  10/16/2015  CLINICAL DATA:  Initial evaluation for acute stroke, abnormal MRI performed earlier today. Patient has been unsteady on feet for past few months. EXAM: MRA HEAD WITHOUT CONTRAST TECHNIQUE: Angiographic images of the Circle of Willis were obtained using MRA technique without intravenous contrast. COMPARISON:  Prior MRI from earlier the same day. FINDINGS: ANTERIOR CIRCULATION: Visualized distal cervical segments of the internal carotid arteries are widely patent with antegrade flow. The petrous, cavernous, and supraclinoid segments are widely patent. A1 segments well opacified bilaterally. Anterior communicating artery normal. Anterior cerebral arteries widely patent. M1 segments well opacified without focal stenosis or occlusion. MCA bifurcations are normal. Distal MCA branches are well opacified and symmetric bilaterally. POSTERIOR CIRCULATION: The right vertebral artery is dominant and widely patent to the vertebrobasilar junction. The left vertebral artery is diminutive with attenuated flow with multi focal irregularity. The diminutive left vertebral artery divides at the takeoff of the left posterior inferior cerebral artery with a smaller hypoplastic branch ascending towards the vertebrobasilar junction. The left posterior inferior cerebellar artery itself is opacified. The right posterior inferior cerebral artery is not visualized. Basilar artery is widely patent. There appears to be a dominant right anterior inferior cerebral artery. Basilar tip is somewhat ectatic without focal aneurysm. Superior cerebellar arteries patent bilaterally. Both posterior cerebral arteries arise in the basilar artery and are well opacified to their distal aspects. No aneurysm or vascular malformation. IMPRESSION: 1. Diminutive and attenuated left vertebral artery, which does remain patent to the vertebrobasilar junction. The dominant right vertebral artery is widely patent without  focal stenosis or other acute abnormality. 2. Nonvisualization of the right posterior inferior cerebral artery. There does appear to be a dominant right anterior inferior cerebral artery. 3. Widely patent anterior circulation. 4. Mild ectasia of the basilar tip without focal aneurysm. Electronically Signed   By: Jeannine Boga M.D.   On: 10/16/2015 23:44   Mr Brain Wo Contrast  10/16/2015  CLINICAL DATA:  Hereditary neuropathy. Ataxia over the last few months. EXAM: MRI HEAD WITHOUT CONTRAST TECHNIQUE: Multiplanar, multiecho pulse sequences of the brain and surrounding structures were obtained without intravenous contrast. COMPARISON:  MRI brain 05/22/2013. FINDINGS: Acute nonhemorrhagic 1.5 cm infarct is present in the right cerebellum posteriorly. This likely accounts for the patient's ataxia. Moderate atrophy and periventricular white matter disease is otherwise stable. No acute hemorrhage or mass lesion present. The ventricles are proportionate to the degree of atrophy. Basal ganglia are intact. Dilated perivascular spaces are again noted, particularly in the right caudate head. Flow is present in the major intracranial arteries the internal auditory canals are within normal limits.  Bilateral lens replacements are present. The globes and orbits are otherwise intact. The paranasal sinuses are clear. There is some fluid in the mastoid air cells. No obstructing nasopharyngeal lesion is evident. The skullbase is within normal limits. Mild degenerative changes are present in the upper cervical spine. Midline structures are otherwise unremarkable. IMPRESSION: 1. Acute nonhemorrhagic 1.5 cm infarct within the right cerebellum. 2. Moderate generalized atrophy and white matter disease is otherwise stable. This is compatible with chronic microvascular ischemic change. 3. Mild degenerative changes within the upper cervical spine are also similar to the prior exam. Electronically Signed   By: San Morelle  M.D.   On: 10/16/2015 18:52      MRI of the brain   IMPRESSION: 1. Acute nonhemorrhagic 1.5 cm infarct within the right cerebellum. 2. Moderate generalized atrophy and white matter disease is otherwise stable. This is compatible with chronic microvascular ischemic change. 3. Mild degenerative changes within the upper cervical spine are also similar to the prior exam. MRA of the brain   IMPRESSION: 1. Diminutive and attenuated left vertebral artery, which does remain patent to the vertebrobasilar junction. The dominant right vertebral artery is widely patent without focal stenosis or other acute abnormality. 2. Nonvisualization of the right posterior inferior cerebral artery. There does appear to be a dominant right anterior inferior cerebral artery. 3. Widely patent anterior circulation. 4. Mild ectasia of the basilar tip without focal aneurysm.  2D Echocardiogram  pending  Carotid Doppler   Vascular Ultrasound Carotid Duplex (Doppler) has been completed. Preliminary findings: Bilateral 1-39% ICA stenosis, based on diastolic velocity. 40-59% ICA stenosis, based on systolic velocity and ratio. EKG   Sinus rhythm with 1st degree A-V block Left axis deviation Non-specific intra-ventricular conduction block Cannot rule out Anterior infarct , age undetermined Abnormal ECG   Therapy Recommendations pending  Physical Exam  General: The patient is alert and cooperative at the time of the examination.  Respiratory: Lung fields are clear  Cardiovascular examination: Irregular heart rhythm, no murmurs  Skin: No significant peripheral edema is noted.   Neurologic Exam  Mental status: The patient is alert and oriented x 3 at the time of the examination. The patient has apparent normal recent and remote memory, with an apparently normal attention span and concentration ability.   Cranial nerves: Facial symmetry is present. Speech is normal, no aphasia or dysarthria is noted.  Extraocular movements are full. Visual fields are full.  Motor: The patient has good strength in all 4 extremities.  Sensory examination: Soft touch sensation is symmetric on the face, arms, and legs.  Coordination: The patient has good finger-nose-finger and heel-to-shin bilaterally.  Gait and station: The gait was not tested.  Reflexes: Deep tendon reflexes are symmetric.    ASSESSMENT Andrew [name] is a 80 y.o. male presenting with right cerebellar stroke, no definite clinical correlate. The patient was being evaluated for a pre-existing history of gait instability, he noted some right arm discomfort the day prior to admission, no definite change in coordination, gait change, headache, slurred speech, double vision, syncope. The patient was on Elavil requests and low-dose aspirin 81 mg prior to admission. He has a history of atrial fibrillation, diabetes, coronary artery disease, dyslipidemia, and hypertension.   Right cerebellar stroke  Atrial fibrillation  Coronary artery disease  Left ventricular failure  History of carotid artery stenosis  Hypertension  Diabetes  History of congestive heart failure  History of deep venous thrombosis  Hospital day # 2  This patient  has multiple risk factors for stroke, the cerebellar stroke event is with unknown time of onset, patient was not a TPA candidate. The patient has minimal deficits from this, undergoing some workup. He is already on maximal medical therapy. He likely will not require any physical occupational therapy in the hospital.  TREATMENT/PLAN  No change medical therapy, the patient is on antiplatelet agents and anticoagulant medication at this time  2-D echocardiogram pending, in progress  Carotid Doppler study unremarkable  Hemoglobin A1c pending  LDL 74, patient is on Zetia and Lipitor  Physical, occupational therapy evaluation  Mobilize patient, physical therapy recommends home health  therapy  Probable discharge today following 2-D echocardiogram follow-up with Dr. Tomi Likens following admission.  10/18/2015 10:59 AM  Lenor Coffin Phone [phone]

## 2015-10-18 NOTE — Progress Notes (Signed)
Patient is d/c home. D/c instructions given and patient verbalized understanding. Consent given for [facility] management.

## 2015-10-18 NOTE — Discharge Summary (Signed)
Physician Discharge Summary  NITISH KESTEL [id number] DOB: October 06, 1928 DOA: 10/16/2015  PCP: [name], MD  Admit date: 10/16/2015 Discharge date: 10/18/2015  Time spent: 45 minutes  Recommendations for Outpatient Follow-up:  Patient will be discharged to home with home health PT.  Patient will need to follow up with primary care provider within one week of discharge.  Patient will also need to follow up with Dr. Tomi Likens. Follow up with Dr. [name], cardiology, call his office to set up appointment.  Patient should continue medications as prescribed.  Patient should follow a heart heathy/carb modified diet.   Discharge Diagnoses:  Acute CVA Coronary atherosclerosis Chronic diastolic heart failure Paroxysmal atrial fibrillation Essential hypertension Hypothyroidism Diabetes mellitus, type II Hyperlipidemia Peripheral vascular disease Acute on chronic kidney disease, stage III Normocytic anemia  Discharge Condition: Stable   Diet recommendation: heart heathy/carb modified  Filed Weights   10/16/15 1859 10/17/15 0155  Weight: 79.833 kg (176 lb) 80.797 kg (178 lb 2 oz)    History of present illness:  on 10/16/2015 by Dr. Bonney Roussel is a 81 y.o. male with Multiple medical problems who complaints of having intermittent dizziness, and being unsteady on his feet for the past 3-4 weeks who had an outpatient MRI performed today which revealed an acute nonhemorrhagic infarct of the right cerebellum and was sent to the ED for further evaluation. He denies having any chest pain, or headaches or visual changes. He was seen by Neurology in the ED dr. Leonel Ramsay and was referred for medical admission.   Hospital Course:  Acute CVA -MRI brain: Acute nonhemorrhagic 1.5 cm infarct in the right cerebellum -Echocardiogram:  EF 40-45% -Carotid doppler:Bilateral 1-39% ICA stenosis, based on diastolic velocity. 40-59% ICA stenosis, based on systolic velocity and  ratio. -LDL 74 -hemoglobin A1c 6.4  -PT consulted- recommended home health -Neurology consulted and appreciated -Continue Aspirin, statin, Eliquis  Coronary atherosclerosis -Currently stable, continue aspirin, statin  Chronic systolic and diastolic heart failure -Currently euvolemic and compensated -Lasix, losartan held -Echocardiogram EF 40-45% -Past echocardigram show EF ranging from 45-50% -Spoke with the patient, he willl follow up with Dr. [name]  Paroxysmal atrial fibrillation -CHADSVASC 8 (based on CVA, age, HTN, CHF, PVD, DM) -Continue amiodarone and Eliquis  Essential hypertension -Held Lasix and losartan due to AKI, continue at discharge -Continue procardia  Hypothyroidism -Continue Synthroid  Diabetes mellitus, type II -Hemoglobin A1c 6.4 -Was placed on insulin sliding scale CBG monitoring -Patient states he is diet controlled.  Advised patient to follow up with PCP regarding management.  Hyperlipidemia -Continue statin -Lipid panel: TC 126, TG 33, HDL 45, LDL 74  Peripheral vascular disease -Continue Pletal  Acute on Chronic kidney disease, stage III -Creatinine currently 1.7 -Baseline creatinine 1.8 -Held Lasix and losartan- may resume at discharge  Normocytic anemia, chronic -Hemoglobin currently 9.4, baseline between 8-9  Procedures  Echocardiogram Carotid doppler  Consults  Neurology  Discharge Exam: Filed Vitals:   10/18/15 0919  BP: 125/62  Pulse: 85  Temp: 98.1 F (36.7 C)  Resp: 20   Exam  General: Well developed, well nourished, NAD  HEENT: NCAT, mucous membranes moist.   Cardiovascular: S1 S2 auscultated, IRR  Respiratory: Clear to auscultation bilaterally   Abdomen: Soft, nontender, nondistended, + bowel sounds  Extremities: warm dry without cyanosis clubbing or edema. Healing cellulitis/wounds on LE anteriorly  Neuro: AAOx3, nonfocal  Psych: Normal affect and demeanor   Discharge Instructions        Discharge Instructions  Discharge instructions    Complete by:  As directed   Patient will be discharged to home with home health PT.  Patient will need to follow up with primary care provider within one week of discharge.  Patient will also need to follow up with Dr. Tomi Likens. Follow up with Dr. [name], cardiology, call his office to set up appointment.  Patient should continue medications as prescribed.  Patient should follow a heart heathy/carb modified diet.            Medication List    TAKE these medications        acetaminophen 500 MG tablet  Commonly known as:  TYLENOL  Take 1,000 mg by mouth every 8 (eight) hours as needed for moderate pain.     ALPRAZolam 0.5 MG tablet  Commonly known as:  XANAX  Take 0.25-0.5 mg by mouth 2 (two) times daily as needed for anxiety or sleep.     amiodarone 200 MG tablet  Commonly known as:  PACERONE  Take 1 tablet (200 mg total) by mouth daily.     apixaban 2.5 MG Tabs tablet  Commonly known as:  ELIQUIS  Take 1 tablet (2.5 mg total) by mouth 2 (two) times daily.     aspirin EC 81 MG tablet  Take 1 tablet (81 mg total) by mouth daily.     atorvastatin 80 MG tablet  Commonly known as:  LIPITOR  TAKE 1 TABLET ONCE DAILY.     cilostazol 100 MG tablet  Commonly known as:  PLETAL  Take 100 mg by mouth 2 (two) times daily.     CO Q 10 PO  Take 1 tablet by mouth daily.     erythromycin with ethanol 2 % external solution  Commonly known as:  THERAMYCIN  Apply 1 application topically daily as needed (for skin irritation around neck).     ezetimibe 10 MG tablet  Commonly known as:  ZETIA  Take 10 mg by mouth daily.     FLUoxetine 20 MG capsule  Commonly known as:  PROZAC  Take 1 capsule (20 mg total) by mouth daily.     furosemide 40 MG tablet  Commonly known as:  LASIX  Take 60-80 mg by mouth 2 (two) times daily. Take 80 mg (2 tablets) in the morning and 60 mg (1 and 1/2 tablets) in the afternoon     GENTEAL MILD 0.2 % Soln   Generic drug:  Hypromellose  Place 1 drop into both eyes daily as needed (for dry eyes).     glucosamine-chondroitin 500-400 MG tablet  Take 1 tablet by mouth 2 (two) times daily.     losartan 100 MG tablet  Commonly known as:  COZAAR  Take 100 mg by mouth daily.     multivitamin tablet  Take 1 tablet by mouth daily.     NF FORMULAS TESTOSTERONE Caps  Take 1 capsule by mouth daily.     NIFEdipine 30 MG 24 hr tablet  Commonly known as:  PROCARDIA-XL/ADALAT-CC/NIFEDICAL-XL  Take 30 mg by mouth daily.     NITROSTAT 0.4 MG SL tablet  Generic drug:  nitroGLYCERIN  DISSOLVE 1 TABLET UNDER TONGUE AS NEEDED FOR CHEST PAIN,MAY REPEAT IN5 MINUTES FOR 2 DOSES.     omega-3 acid ethyl esters 1 G capsule  Commonly known as:  LOVAZA  Take 1 g by mouth 2 (two) times daily.     polyethylene glycol packet  Commonly known as:  MIRALAX / GLYCOLAX  Take 17  g by mouth daily as needed (constipation).     potassium chloride SA 20 MEQ tablet  Commonly known as:  K-DUR,KLOR-CON  TAKE 1 TABLET ONCE DAILY.     tamsulosin 0.4 MG Caps capsule  Commonly known as:  FLOMAX  Take 0.4 mg by mouth daily.     temazepam 30 MG capsule  Commonly known as:  RESTORIL  TAKE 1 CAPSULE AT BEDTIME AS NEEDED FOR SLEEP.       Allergies  Allergen Reactions  . Metoprolol Shortness Of Breath and Palpitations    Heart starts racing. Shallow breathing   . Metformin And Related Nausea And Vomiting   Follow-up Information    Follow up with [name], MD. Schedule an appointment as soon as possible for a visit in 1 week.   Specialty:  Internal Medicine   Why:  Hospital follow up   Contact information:   [address] [address] [phone]       Follow up with Dudley Major, DO. Call in 2 weeks.   Specialty:  Neurology   Why:  Hospital follow up   Contact information:   [address] [place] [address] [phone]       Follow up with Loralie Champagne, MD.   Specialty:  Cardiology   Why:  Hospital follow up   Contact information:   [id number] [place] [address] [phone]        The results of significant diagnostics from this hospitalization (including imaging, microbiology, ancillary and laboratory) are listed below for reference.    Significant Diagnostic Studies: Mr [name] Contrast  10/16/2015  CLINICAL DATA:  Initial evaluation for acute stroke, abnormal MRI performed earlier today. Patient has been unsteady on feet for past few months. EXAM: MRA HEAD WITHOUT CONTRAST TECHNIQUE: Angiographic images of the Circle of Willis were obtained using MRA technique without intravenous contrast. COMPARISON:  Prior MRI from earlier the same day. FINDINGS: ANTERIOR CIRCULATION: Visualized distal cervical segments of the internal carotid arteries are widely patent with antegrade flow. The petrous, cavernous, and supraclinoid segments are widely patent. A1 segments well opacified bilaterally. Anterior communicating artery normal. Anterior cerebral arteries widely patent. M1 segments well opacified without focal stenosis or occlusion. MCA bifurcations are normal. Distal MCA branches are well opacified and symmetric bilaterally. POSTERIOR CIRCULATION: The right vertebral artery is dominant and widely patent to the vertebrobasilar junction. The left vertebral artery is diminutive with attenuated flow with multi focal irregularity. The diminutive left vertebral artery divides at the takeoff of the left posterior inferior cerebral artery with a smaller hypoplastic branch ascending towards the vertebrobasilar junction. The left posterior inferior cerebellar artery itself is opacified. The right posterior inferior cerebral artery is not visualized. Basilar artery is widely patent. There appears to be a dominant right anterior inferior cerebral artery. Basilar tip is somewhat ectatic without focal aneurysm. Superior cerebellar  arteries patent bilaterally. Both posterior cerebral arteries arise in the basilar artery and are well opacified to their distal aspects. No aneurysm or vascular malformation. IMPRESSION: 1. Diminutive and attenuated left vertebral artery, which does remain patent to the vertebrobasilar junction. The dominant right vertebral artery is widely patent without focal stenosis or other acute abnormality. 2. Nonvisualization of the right posterior inferior cerebral artery. There does appear to be a dominant right anterior inferior cerebral artery. 3. Widely patent anterior circulation. 4. Mild ectasia of the basilar tip without focal aneurysm. Electronically  Signed   By: Jeannine Boga M.D.   On: 10/16/2015 23:44   Mr Brain Wo Contrast  10/16/2015  CLINICAL DATA:  Hereditary neuropathy. Ataxia over the last few months. EXAM: MRI HEAD WITHOUT CONTRAST TECHNIQUE: Multiplanar, multiecho pulse sequences of the brain and surrounding structures were obtained without intravenous contrast. COMPARISON:  MRI brain 05/22/2013. FINDINGS: Acute nonhemorrhagic 1.5 cm infarct is present in the right cerebellum posteriorly. This likely accounts for the patient's ataxia. Moderate atrophy and periventricular white matter disease is otherwise stable. No acute hemorrhage or mass lesion present. The ventricles are proportionate to the degree of atrophy. Basal ganglia are intact. Dilated perivascular spaces are again noted, particularly in the right caudate head. Flow is present in the major intracranial arteries the internal auditory canals are within normal limits. Bilateral lens replacements are present. The globes and orbits are otherwise intact. The paranasal sinuses are clear. There is some fluid in the mastoid air cells. No obstructing nasopharyngeal lesion is evident. The skullbase is within normal limits. Mild degenerative changes are present in the upper cervical spine. Midline structures are otherwise unremarkable.  IMPRESSION: 1. Acute nonhemorrhagic 1.5 cm infarct within the right cerebellum. 2. Moderate generalized atrophy and white matter disease is otherwise stable. This is compatible with chronic microvascular ischemic change. 3. Mild degenerative changes within the upper cervical spine are also similar to the prior exam. Electronically Signed   By: San Morelle M.D.   On: 10/16/2015 18:52    Microbiology: No results found for this or any previous visit (from the past 240 hour(s)).   Labs: Basic Metabolic Panel:  Recent Labs Lab 10/12/15 1058 10/16/15 2045 10/18/15 0512  NA 141 135 137  K 4.5 4.2 4.0  CL 107 100* 102  CO2 27 25 26   GLUCOSE 113* 90 99  BUN 40* 51* 35*  CREATININE 1.79* 2.23* 1.70*  CALCIUM 9.5 9.0 8.9   Liver Function Tests: No results for input(s): AST, ALT, ALKPHOS, BILITOT, PROT, ALBUMIN in the last 168 hours. No results for input(s): LIPASE, AMYLASE in the last 168 hours. No results for input(s): AMMONIA in the last 168 hours. CBC:  Recent Labs Lab 10/16/15 2045 10/18/15 0512  WBC 6.3 6.5  NEUTROABS 4.3  --   HGB 9.5* 9.4*  HCT 29.4* 29.8*  MCV 94.5 94.3  PLT 211 191   Cardiac Enzymes: No results for input(s): CKTOTAL, CKMB, CKMBINDEX, TROPONINI in the last 168 hours. BNP: BNP (last 3 results)  Recent Labs  06/17/15 1050 07/30/15 1030 10/12/15 1058  BNP 245.4* 323.3* 376.3*    ProBNP (last 3 results) No results for input(s): PROBNP in the last 8760 hours.  CBG:  Recent Labs Lab 10/17/15 1137 10/17/15 1614 10/17/15 2140 10/18/15 0623 10/18/15 1107  GLUCAP 98 216* 87 106* 204*    Signed:  Kahlen Boyde  [facility] 10/18/2015, 1:29 PM

## 2015-10-18 NOTE — Progress Notes (Signed)
  Echocardiogram 2D Echocardiogram has been performed.  Andrew [name] 10/18/2015, 11:22 AM

## 2015-10-18 NOTE — Progress Notes (Signed)
Case manager called to notify about patient's home health PT. Awaiting call back.

## 2015-10-18 NOTE — Progress Notes (Signed)
Paged Echo to see when patient's procedure can be done. Got a call back and was told he is on the list for today.

## 2015-10-18 NOTE — Discharge Instructions (Signed)
Stroke Prevention Some medical conditions and behaviors are associated with an increased chance of having a stroke. You may prevent a stroke by making healthy choices and managing medical conditions. HOW CAN I REDUCE MY RISK OF HAVING A STROKE?   Stay physically active. Get at least 30 minutes of activity on most or all days.  Do not smoke. It may also be helpful to avoid exposure to secondhand smoke.  Limit alcohol use. Moderate alcohol use is considered to be:  No more than 2 drinks per day for men.  No more than 1 drink per day for nonpregnant women.  Eat healthy foods. This involves:  Eating 5 or more servings of fruits and vegetables a day.  Making dietary changes that address high blood pressure (hypertension), high cholesterol, diabetes, or obesity.  Manage your cholesterol levels.  Making food choices that are high in fiber and low in saturated fat, trans fat, and cholesterol may control cholesterol levels.  Take any prescribed medicines to control cholesterol as directed by your health care provider.  Manage your diabetes.  Controlling your carbohydrate and sugar intake is recommended to manage diabetes.  Take any prescribed medicines to control diabetes as directed by your health care provider.  Control your hypertension.  Making food choices that are low in salt (sodium), saturated fat, trans fat, and cholesterol is recommended to manage hypertension.  Ask your health care provider if you need treatment to lower your blood pressure. Take any prescribed medicines to control hypertension as directed by your health care provider.  If you are 18-39 years of age, have your blood pressure checked every 3-5 years. If you are 40 years of age or older, have your blood pressure checked every year.  Maintain a healthy weight.  Reducing calorie intake and making food choices that are low in sodium, saturated fat, trans fat, and cholesterol are recommended to manage  weight.  Stop drug abuse.  Avoid taking birth control pills.  Talk to your health care provider about the risks of taking birth control pills if you are over 35 years old, smoke, get migraines, or have ever had a blood clot.  Get evaluated for sleep disorders (sleep apnea).  Talk to your health care provider about getting a sleep evaluation if you snore a lot or have excessive sleepiness.  Take medicines only as directed by your health care provider.  For some people, aspirin or blood thinners (anticoagulants) are helpful in reducing the risk of forming abnormal blood clots that can lead to stroke. If you have the irregular heart rhythm of atrial fibrillation, you should be on a blood thinner unless there is a good reason you cannot take them.  Understand all your medicine instructions.  Make sure that other conditions (such as anemia or atherosclerosis) are addressed. SEEK IMMEDIATE MEDICAL CARE IF:   You have sudden weakness or numbness of the face, arm, or leg, especially on one side of the body.  Your face or eyelid droops to one side.  You have sudden confusion.  You have trouble speaking (aphasia) or understanding.  You have sudden trouble seeing in one or both eyes.  You have sudden trouble walking.  You have dizziness.  You have a loss of balance or coordination.  You have a sudden, severe headache with no known cause.  You have new chest pain or an irregular heartbeat. Any of these symptoms may represent a serious problem that is an emergency. Do not wait to see if the symptoms will   go away. Get medical help at once. Call your local emergency services (911 in [place] Do not drive yourself to the hospital.   This information is not intended to replace advice given to you by your health care provider. Make sure you discuss any questions you have with your health care provider.   Document Released: 01/19/2005 Document Revised: 01/02/2015 Document Reviewed:  06/14/2013 Elsevier Interactive Patient Education 2016 [employer]  

## 2015-10-18 NOTE — Evaluation (Signed)
SLP Cancellation Note  Patient Details Name: Andrew [name] MRN: [id number] DOB: Feb 05, 1928   Cancelled treatment:       Reason Eval/Treat Not Completed: SLP screened, no needs identified, will sign off   Luanna Salk, [facility] SLP [phone]

## 2015-10-19 ENCOUNTER — Telehealth: Payer: Self-pay | Admitting: Cardiology

## 2015-10-19 LAB — HEMOGLOBIN A1C
HEMOGLOBIN A1C: 5.9 % — AB (ref 4.8–5.6)
MEAN PLASMA GLUCOSE: 123 mg/dL

## 2015-10-19 NOTE — Telephone Encounter (Signed)
New Message   Pt calling he states he has a minor stroke this weekend and the results shown   For his heart is that his heart in in a 50 range and he wants Dr. [name] to be aware and to have a RN  Call him back to see what the next step will be for medications

## 2015-10-19 NOTE — Telephone Encounter (Signed)
Pt was in hospital for stroke, will send to Dr [name] to review

## 2015-10-19 NOTE — Telephone Encounter (Signed)
Make sure he is taking his Eliquis and would have him followup as scheduled as long as he is feeling ok.

## 2015-10-20 ENCOUNTER — Telehealth: Payer: Self-pay | Admitting: Internal Medicine

## 2015-10-20 ENCOUNTER — Telehealth: Payer: Self-pay | Admitting: *Deleted

## 2015-10-20 ENCOUNTER — Encounter: Payer: Self-pay | Admitting: Neurology

## 2015-10-20 ENCOUNTER — Telehealth: Payer: Self-pay | Admitting: Neurology

## 2015-10-20 ENCOUNTER — Ambulatory Visit (INDEPENDENT_AMBULATORY_CARE_PROVIDER_SITE_OTHER): Payer: Medicare PPO | Admitting: Neurology

## 2015-10-20 VITALS — BP 90/48 | HR 78 | Wt 183.0 lb

## 2015-10-20 DIAGNOSIS — E1122 Type 2 diabetes mellitus with diabetic chronic kidney disease: Secondary | ICD-10-CM | POA: Diagnosis not present

## 2015-10-20 DIAGNOSIS — D649 Anemia, unspecified: Secondary | ICD-10-CM | POA: Diagnosis not present

## 2015-10-20 DIAGNOSIS — I4891 Unspecified atrial fibrillation: Secondary | ICD-10-CM | POA: Diagnosis not present

## 2015-10-20 DIAGNOSIS — N182 Chronic kidney disease, stage 2 (mild): Secondary | ICD-10-CM | POA: Diagnosis not present

## 2015-10-20 DIAGNOSIS — I63441 Cerebral infarction due to embolism of right cerebellar artery: Secondary | ICD-10-CM | POA: Diagnosis not present

## 2015-10-20 DIAGNOSIS — I5032 Chronic diastolic (congestive) heart failure: Secondary | ICD-10-CM | POA: Diagnosis not present

## 2015-10-20 DIAGNOSIS — I2510 Atherosclerotic heart disease of native coronary artery without angina pectoris: Secondary | ICD-10-CM | POA: Diagnosis not present

## 2015-10-20 DIAGNOSIS — I129 Hypertensive chronic kidney disease with stage 1 through stage 4 chronic kidney disease, or unspecified chronic kidney disease: Secondary | ICD-10-CM | POA: Diagnosis not present

## 2015-10-20 DIAGNOSIS — F329 Major depressive disorder, single episode, unspecified: Secondary | ICD-10-CM | POA: Diagnosis not present

## 2015-10-20 DIAGNOSIS — Z9181 History of falling: Secondary | ICD-10-CM | POA: Diagnosis not present

## 2015-10-20 NOTE — Telephone Encounter (Signed)
Transition Care Management Follow-up Telephone Call   Date discharged? 10/18/15   How have you been since you were released from the hospital? Pt states he is alright   Do you understand why you were in the hospital? YES   Do you understand the discharge instructions? YES   Where were you discharged to? Home   Items Reviewed:  Medications reviewed: YES  Allergies reviewed: YES  Dietary changes reviewed: YES  Referrals reviewed: YES, he states he has not been contacted yet for PT   Functional Questionnaire:   Activities of Daily Living (ADLs):   He states they are independent in the following: ambulation, bathing and hygiene, feeding, continence, grooming, toileting and dressing States he require assistance with the following: ambulation   Any transportation issues/concerns?: YES   Any patient concerns? YES   Confirmed importance and date/time of follow-up visits scheduled YES, appt 10/21/15  Provider Appointment booked with Dr. Ronnald Ramp  Confirmed with patient if condition begins to worsen call PCP or go to the ER.  Patient was given the office number and encouraged to call back with question or concerns.  : YES
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
## 2015-10-20 NOTE — Patient Instructions (Signed)
Continue current management Follow up in 3 months
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
## 2015-10-20 NOTE — Telephone Encounter (Signed)
Spoke with pt and he is still taking Eliquis.  He is feeling well.

## 2015-10-21 ENCOUNTER — Other Ambulatory Visit: Payer: Self-pay | Admitting: Cardiology

## 2015-10-21 ENCOUNTER — Encounter: Payer: Self-pay | Admitting: Internal Medicine

## 2015-10-21 ENCOUNTER — Ambulatory Visit (INDEPENDENT_AMBULATORY_CARE_PROVIDER_SITE_OTHER): Payer: Medicare PPO | Admitting: Internal Medicine

## 2015-10-21 VITALS — BP 138/70 | HR 77 | Temp 98.5°F | Resp 16 | Ht 66.0 in | Wt 182.0 lb

## 2015-10-21 DIAGNOSIS — N183 Chronic kidney disease, stage 3 unspecified: Secondary | ICD-10-CM

## 2015-10-21 DIAGNOSIS — E1122 Type 2 diabetes mellitus with diabetic chronic kidney disease: Secondary | ICD-10-CM

## 2015-10-21 DIAGNOSIS — I63441 Cerebral infarction due to embolism of right cerebellar artery: Secondary | ICD-10-CM | POA: Diagnosis not present

## 2015-10-21 DIAGNOSIS — I480 Paroxysmal atrial fibrillation: Secondary | ICD-10-CM

## 2015-10-21 DIAGNOSIS — I639 Cerebral infarction, unspecified: Secondary | ICD-10-CM

## 2015-10-21 DIAGNOSIS — E038 Other specified hypothyroidism: Secondary | ICD-10-CM

## 2015-10-21 DIAGNOSIS — I10 Essential (primary) hypertension: Secondary | ICD-10-CM

## 2015-10-21 DIAGNOSIS — I5032 Chronic diastolic (congestive) heart failure: Secondary | ICD-10-CM | POA: Diagnosis not present

## 2015-10-21 NOTE — Assessment & Plan Note (Signed)
TSh is slightly elevated Will follow for now with no changes in dose

## 2015-10-21 NOTE — Patient Instructions (Signed)
Hypertension Hypertension, commonly called high blood pressure, is when the force of blood pumping through your arteries is too strong. Your arteries are the blood vessels that carry blood from your heart throughout your body. A blood pressure reading consists of a higher number over a lower number, such as 110/72. The higher number (systolic) is the pressure inside your arteries when your heart pumps. The lower number (diastolic) is the pressure inside your arteries when your heart relaxes. Ideally you want your blood pressure below 120/80. Hypertension forces your heart to work harder to pump blood. Your arteries may become narrow or stiff. Having untreated or uncontrolled hypertension can cause heart attack, stroke, kidney disease, and other problems. RISK FACTORS Some risk factors for high blood pressure are controllable. Others are not.  Risk factors you cannot control include:   Race. You may be at higher risk if you are African American.  Age. Risk increases with age.  Gender. Men are at higher risk than women before age 45 years. After age 65, women are at higher risk than men. Risk factors you can control include:  Not getting enough exercise or physical activity.  Being overweight.  Getting too much fat, sugar, calories, or salt in your diet.  Drinking too much alcohol. SIGNS AND SYMPTOMS Hypertension does not usually cause signs or symptoms. Extremely high blood pressure (hypertensive crisis) may cause headache, anxiety, shortness of breath, and nosebleed. DIAGNOSIS To check if you have hypertension, your health care provider will measure your blood pressure while you are seated, with your arm held at the level of your heart. It should be measured at least twice using the same arm. Certain conditions can cause a difference in blood pressure between your right and left arms. A blood pressure reading that is higher than normal on one occasion does not mean that you need treatment. If  it is not clear whether you have high blood pressure, you may be asked to return on a different day to have your blood pressure checked again. Or, you may be asked to monitor your blood pressure at home for 1 or more weeks. TREATMENT Treating high blood pressure includes making lifestyle changes and possibly taking medicine. Living a healthy lifestyle can help lower high blood pressure. You may need to change some of your habits. Lifestyle changes may include:  Following the DASH diet. This diet is high in fruits, vegetables, and whole grains. It is low in salt, red meat, and added sugars.  Keep your sodium intake below 2,300 mg per day.  Getting at least 30-45 minutes of aerobic exercise at least 4 times per week.  Losing weight if necessary.  Not smoking.  Limiting alcoholic beverages.  Learning ways to reduce stress. Your health care provider may prescribe medicine if lifestyle changes are not enough to get your blood pressure under control, and if one of the following is true:  You are 18-59 years of age and your systolic blood pressure is above 140.  You are 60 years of age or older, and your systolic blood pressure is above 150.  Your diastolic blood pressure is above 90.  You have diabetes, and your systolic blood pressure is over 140 or your diastolic blood pressure is over 90.  You have kidney disease and your blood pressure is above 140/90.  You have heart disease and your blood pressure is above 140/90. Your personal target blood pressure may vary depending on your medical conditions, your age, and other factors. HOME CARE INSTRUCTIONS    Have your blood pressure rechecked as directed by your health care provider.   Take medicines only as directed by your health care provider. Follow the directions carefully. Blood pressure medicines must be taken as prescribed. The medicine does not work as well when you skip doses. Skipping doses also puts you at risk for  problems.  Do not smoke.   Monitor your blood pressure at home as directed by your health care provider. SEEK MEDICAL CARE IF:   You think you are having a reaction to medicines taken.  You have recurrent headaches or feel dizzy.  You have swelling in your ankles.  You have trouble with your vision. SEEK IMMEDIATE MEDICAL CARE IF:  You develop a severe headache or confusion.  You have unusual weakness, numbness, or feel faint.  You have severe chest or abdominal pain.  You vomit repeatedly.  You have trouble breathing. MAKE SURE YOU:   Understand these instructions.  Will watch your condition.  Will get help right away if you are not doing well or get worse.   This information is not intended to replace advice given to you by your health care provider. Make sure you discuss any questions you have with your health care provider.   Document Released: 12/12/2005 Document Revised: 04/28/2015 Document Reviewed: 10/04/2013 Elsevier Interactive Patient Education 2016 [employer]  

## 2015-10-21 NOTE — Assessment & Plan Note (Signed)
His renal function is stable/improved

## 2015-10-21 NOTE — Progress Notes (Signed)
Subjective:  Patient ID: Andrew [name], male    DOB: Jul 16, 1928  Age: 81 y.o. 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 He is making a speedy recovery. He is back to doing all of his normal activities and other than some mild ataxia and dizziness, he does not offer any specific neurological complaints. He was told at the hospital recheck with me about treating his diabetes but his A1c is only 5.9% so he does not need a medication for diabetes. He is already taking all of the medications to help with secondary stroke prevention.  Outpatient Prescriptions Prior to Visit  Medication Sig Dispense Refill  . acetaminophen (TYLENOL) 500 MG tablet Take 1,000 mg by mouth every 8 (eight) hours as needed for moderate pain.     Marland Kitchen ALPRAZolam (XANAX) 0.5 MG tablet Take 0.25-0.5 mg by mouth 2 (two) times daily as needed for anxiety or sleep.     Marland Kitchen amiodarone (PACERONE) 200 MG tablet Take 1 tablet (200 mg total) by mouth daily.    Marland Kitchen apixaban (ELIQUIS) 2.5 MG TABS tablet Take 1 tablet (2.5 mg total) by mouth 2 (two) times daily. 60 tablet 3  . aspirin EC 81 MG tablet Take 1 tablet (81 mg total) by mouth daily.    Marland Kitchen atorvastatin (LIPITOR) 80 MG tablet TAKE 1 TABLET ONCE DAILY. 30 tablet 0  . cilostazol (PLETAL) 100 MG tablet Take 100 mg by mouth 2 (two) times daily.    . Coenzyme Q10 (CO Q 10 PO) Take 1 tablet by mouth daily.    Marland Kitchen erythromycin with ethanol (THERAMYCIN) 2 % external solution Apply 1 application topically daily as needed (for skin irritation around neck).    . ezetimibe (ZETIA) 10 MG tablet Take 10 mg by mouth daily.    Marland Kitchen FLUoxetine (PROZAC) 20 MG capsule Take 1 capsule (20 mg total) by mouth daily. 90 capsule 3  . furosemide (LASIX) 40 MG tablet Take 60-80 mg by mouth 2 (two) times daily. Take 80 mg (2 tablets) in the morning and 60  mg (1 and 1/2 tablets) in the afternoon    . glucosamine-chondroitin 500-400 MG tablet Take 1 tablet by mouth 2 (two) times daily.     . Hypromellose (GENTEAL MILD) 0.2 % SOLN Place 1 drop into both eyes daily as needed (for dry eyes).     Marland Kitchen losartan (COZAAR) 100 MG tablet Take 100 mg by mouth daily.    . Misc Natural Products (NF FORMULAS TESTOSTERONE) CAPS Take 1 capsule by mouth daily.     . Multiple Vitamin (MULTIVITAMIN) tablet Take 1 tablet by mouth daily.    Marland Kitchen NIFEdipine (PROCARDIA-XL/ADALAT-CC/NIFEDICAL-XL) 30 MG 24 hr tablet Take 30 mg by mouth daily.    Marland Kitchen NITROSTAT 0.4 MG SL tablet DISSOLVE 1 TABLET UNDER TONGUE AS NEEDED FOR CHEST PAIN,MAY REPEAT IN5 MINUTES FOR 2 DOSES. 25 tablet 3  . omega-3 acid ethyl esters (LOVAZA) 1 G capsule Take 1 g by mouth 2 (two) times daily.    . polyethylene glycol (MIRALAX / GLYCOLAX) packet Take 17 g by mouth daily as needed (constipation).    . potassium chloride SA (K-DUR,KLOR-CON) 20 MEQ tablet TAKE 1 TABLET ONCE DAILY. 30 tablet 11  . tamsulosin (FLOMAX) 0.4 MG CAPS capsule Take 0.4 mg by mouth daily.    . temazepam (RESTORIL) 30 MG capsule TAKE 1 CAPSULE AT  BEDTIME AS NEEDED FOR SLEEP. 30 capsule 3   No facility-administered medications prior to visit.    ROS Review of Systems  Constitutional: Negative.  Negative for fever, chills, diaphoresis, appetite change and fatigue.  HENT: Negative.   Eyes: Negative.   Respiratory: Negative.  Negative for cough, choking, chest tightness, shortness of breath and stridor.   Cardiovascular: Negative.  Negative for chest pain, palpitations and leg swelling.  Gastrointestinal: Negative.  Negative for nausea, vomiting, abdominal pain, diarrhea, constipation and blood in stool.  Endocrine: Negative.  Negative for polydipsia, polyphagia and polyuria.  Genitourinary: Negative.  Negative for dysuria, urgency, hematuria and difficulty urinating.  Musculoskeletal: Positive for gait problem. Negative for myalgias,  back pain, joint swelling, arthralgias, neck pain and neck stiffness.  Skin: Negative.  Negative for color change and rash.  Allergic/Immunologic: Negative.   Neurological: Positive for dizziness.  Hematological: Negative.  Negative for adenopathy. Does not bruise/bleed easily.  Psychiatric/Behavioral: Negative for sleep disturbance and decreased concentration. The patient is nervous/anxious.     Objective:  BP 138/70 mmHg  Pulse 77  Temp(Src) 98.5 F (36.9 C) (Oral)  Resp 16  Ht 5\' 6"  (1.676 m)  Wt 182 lb (82.555 kg)  BMI 29.39 kg/m2  SpO2 97%  BP Readings from Last 3 Encounters:  10/21/15 138/70  10/20/15 90/48  10/18/15 127/56    Wt Readings from Last 3 Encounters:  10/21/15 182 lb (82.555 kg)  10/20/15 183 lb (83.008 kg)  10/17/15 178 lb 2 oz (80.797 kg)    Physical Exam  Constitutional: No distress.  HENT:  Head: Normocephalic and atraumatic.  Mouth/Throat: Oropharynx is clear and moist. No oropharyngeal exudate.  Eyes: Conjunctivae are normal. Right eye exhibits no discharge. Left eye exhibits no discharge. No scleral icterus.  Neck: Normal range of motion. Neck supple. No JVD present. No tracheal deviation present. No thyromegaly present.  Cardiovascular: Normal rate, regular rhythm, normal heart sounds and intact distal pulses.  Exam reveals no gallop and no friction rub.   No murmur heard. Pulmonary/Chest: Effort normal and breath sounds normal. No stridor. No respiratory distress. He has no wheezes. He has no rales. He exhibits no tenderness.  Abdominal: Soft. Bowel sounds are normal. He exhibits no distension and no mass. There is no tenderness. There is no rebound and no guarding.  Lymphadenopathy:    He has no cervical adenopathy.  Neurological: He is alert. He has normal strength. He displays no atrophy, no tremor and normal reflexes. No cranial nerve deficit or sensory deficit. He exhibits normal muscle tone. He displays no seizure activity. Coordination  abnormal. Gait normal. He displays no Babinski's sign on the right side. He displays no Babinski's sign on the left side.  Reflex Scores:      Tricep reflexes are 1+ on the right side and 1+ on the left side.      Bicep reflexes are 1+ on the right side and 1+ on the left side.      Brachioradialis reflexes are 1+ on the right side and 1+ on the left side.      Patellar reflexes are 1+ on the right side and 1+ on the left side.      Achilles reflexes are 1+ on the right side and 1+ on the left side. Skin: Skin is warm and dry. No rash noted. He is not diaphoretic. No erythema. No pallor.  There are healed scabs over both shins anteriorly  Vitals reviewed.   Lab Results  Component Value Date  WBC 6.5 10/18/2015   HGB 9.4* 10/18/2015   HCT 29.8* 10/18/2015   PLT 191 10/18/2015   GLUCOSE 99 10/18/2015   CHOL 126 10/17/2015   TRIG 33 10/17/2015   HDL 45 10/17/2015   LDLDIRECT 78.6 03/18/2010   LDLCALC 74 10/17/2015   ALT 29 09/15/2015   AST 30 09/15/2015   NA 137 10/18/2015   K 4.0 10/18/2015   CL 102 10/18/2015   CREATININE 1.70* 10/18/2015   BUN 35* 10/18/2015   CO2 26 10/18/2015   TSH 5.086* 10/12/2015   PSA 2.79 01/23/2013   INR 1.18 09/17/2015   HGBA1C 5.9* 10/17/2015   MICROALBUR 13.2* 03/23/2015    Mr Mra Head Wo Contrast  10/16/2015  CLINICAL DATA:  Initial evaluation for acute stroke, abnormal MRI performed earlier today. Patient has been unsteady on feet for past few months. EXAM: MRA HEAD WITHOUT CONTRAST TECHNIQUE: Angiographic images of the Circle of Willis were obtained using MRA technique without intravenous contrast. COMPARISON:  Prior MRI from earlier the same day. FINDINGS: ANTERIOR CIRCULATION: Visualized distal cervical segments of the internal carotid arteries are widely patent with antegrade flow. The petrous, cavernous, and supraclinoid segments are widely patent. A1 segments well opacified bilaterally. Anterior communicating artery normal. Anterior  cerebral arteries widely patent. M1 segments well opacified without focal stenosis or occlusion. MCA bifurcations are normal. Distal MCA branches are well opacified and symmetric bilaterally. POSTERIOR CIRCULATION: The right vertebral artery is dominant and widely patent to the vertebrobasilar junction. The left vertebral artery is diminutive with attenuated flow with multi focal irregularity. The diminutive left vertebral artery divides at the takeoff of the left posterior inferior cerebral artery with a smaller hypoplastic branch ascending towards the vertebrobasilar junction. The left posterior inferior cerebellar artery itself is opacified. The right posterior inferior cerebral artery is not visualized. Basilar artery is widely patent. There appears to be a dominant right anterior inferior cerebral artery. Basilar tip is somewhat ectatic without focal aneurysm. Superior cerebellar arteries patent bilaterally. Both posterior cerebral arteries arise in the basilar artery and are well opacified to their distal aspects. No aneurysm or vascular malformation. IMPRESSION: 1. Diminutive and attenuated left vertebral artery, which does remain patent to the vertebrobasilar junction. The dominant right vertebral artery is widely patent without focal stenosis or other acute abnormality. 2. Nonvisualization of the right posterior inferior cerebral artery. There does appear to be a dominant right anterior inferior cerebral artery. 3. Widely patent anterior circulation. 4. Mild ectasia of the basilar tip without focal aneurysm. Electronically Signed   By: Jeannine Boga M.D.   On: 10/16/2015 23:44   Mr Brain Wo Contrast  10/16/2015  CLINICAL DATA:  Hereditary neuropathy. Ataxia over the last few months. EXAM: MRI HEAD WITHOUT CONTRAST TECHNIQUE: Multiplanar, multiecho pulse sequences of the brain and surrounding structures were obtained without intravenous contrast. COMPARISON:  MRI brain 05/22/2013. FINDINGS: Acute  nonhemorrhagic 1.5 cm infarct is present in the right cerebellum posteriorly. This likely accounts for the patient's ataxia. Moderate atrophy and periventricular white matter disease is otherwise stable. No acute hemorrhage or mass lesion present. The ventricles are proportionate to the degree of atrophy. Basal ganglia are intact. Dilated perivascular spaces are again noted, particularly in the right caudate head. Flow is present in the major intracranial arteries the internal auditory canals are within normal limits. Bilateral lens replacements are present. The globes and orbits are otherwise intact. The paranasal sinuses are clear. There is some fluid in the mastoid air cells. No obstructing nasopharyngeal lesion  is evident. The skullbase is within normal limits. Mild degenerative changes are present in the upper cervical spine. Midline structures are otherwise unremarkable. IMPRESSION: 1. Acute nonhemorrhagic 1.5 cm infarct within the right cerebellum. 2. Moderate generalized atrophy and white matter disease is otherwise stable. This is compatible with chronic microvascular ischemic change. 3. Mild degenerative changes within the upper cervical spine are also similar to the prior exam. Electronically Signed   By: San Morelle M.D.   On: 10/16/2015 18:52    Assessment & Plan:   There are no diagnoses linked to this encounter. I am having Mr. Cappell maintain his glucosamine-chondroitin, NIFEdipine, omega-3 acid ethyl esters, NF FORMULAS TESTOSTERONE, multivitamin, aspirin EC, Hypromellose, erythromycin with ethanol, FLUoxetine, apixaban, acetaminophen, Coenzyme Q10 (CO Q 10 PO), NITROSTAT, losartan, ALPRAZolam, polyethylene glycol, cilostazol, tamsulosin, ezetimibe, amiodarone, temazepam, potassium chloride SA, atorvastatin, and furosemide.  No orders of the defined types were placed in this encounter.     Follow-up: Return in about 4 months (around 02/21/2016).  [name], MD

## 2015-10-21 NOTE — Telephone Encounter (Signed)
Pt is in office today for f/u

## 2015-10-21 NOTE — Assessment & Plan Note (Signed)
He has good rate and rhythm control Will cont Eliquis

## 2015-10-21 NOTE — Progress Notes (Signed)
Pre visit review using our clinic review tool, if applicable. No additional management support is needed unless otherwise documented below in the visit note. 

## 2015-10-21 NOTE — Assessment & Plan Note (Signed)
He is improving with no complications Will cont with OT and PT Will cont current meds for secondary risk reduction

## 2015-10-21 NOTE — Assessment & Plan Note (Signed)
His BP is well controlled 

## 2015-10-21 NOTE — Assessment & Plan Note (Signed)
His A1C is only 5.9% No meds or insulin are needed at this time

## 2015-10-22 ENCOUNTER — Telehealth: Payer: Self-pay | Admitting: Internal Medicine

## 2015-10-22 DIAGNOSIS — E1122 Type 2 diabetes mellitus with diabetic chronic kidney disease: Secondary | ICD-10-CM | POA: Diagnosis not present

## 2015-10-22 DIAGNOSIS — D649 Anemia, unspecified: Secondary | ICD-10-CM | POA: Diagnosis not present

## 2015-10-22 DIAGNOSIS — I5032 Chronic diastolic (congestive) heart failure: Secondary | ICD-10-CM | POA: Diagnosis not present

## 2015-10-22 DIAGNOSIS — I129 Hypertensive chronic kidney disease with stage 1 through stage 4 chronic kidney disease, or unspecified chronic kidney disease: Secondary | ICD-10-CM | POA: Diagnosis not present

## 2015-10-22 DIAGNOSIS — N182 Chronic kidney disease, stage 2 (mild): Secondary | ICD-10-CM | POA: Diagnosis not present

## 2015-10-22 DIAGNOSIS — I2510 Atherosclerotic heart disease of native coronary artery without angina pectoris: Secondary | ICD-10-CM | POA: Diagnosis not present

## 2015-10-22 DIAGNOSIS — F329 Major depressive disorder, single episode, unspecified: Secondary | ICD-10-CM | POA: Diagnosis not present

## 2015-10-22 DIAGNOSIS — I4891 Unspecified atrial fibrillation: Secondary | ICD-10-CM | POA: Diagnosis not present

## 2015-10-22 DIAGNOSIS — Z9181 History of falling: Secondary | ICD-10-CM | POA: Diagnosis not present

## 2015-10-22 NOTE — Telephone Encounter (Signed)
ok 
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
## 2015-10-22 NOTE — Telephone Encounter (Signed)
Notified Jim with ms response...Johny Chess

## 2015-10-26 DIAGNOSIS — Z9181 History of falling: Secondary | ICD-10-CM | POA: Diagnosis not present

## 2015-10-26 DIAGNOSIS — I2510 Atherosclerotic heart disease of native coronary artery without angina pectoris: Secondary | ICD-10-CM | POA: Diagnosis not present

## 2015-10-26 DIAGNOSIS — E1122 Type 2 diabetes mellitus with diabetic chronic kidney disease: Secondary | ICD-10-CM | POA: Diagnosis not present

## 2015-10-26 DIAGNOSIS — N182 Chronic kidney disease, stage 2 (mild): Secondary | ICD-10-CM | POA: Diagnosis not present

## 2015-10-26 DIAGNOSIS — I4891 Unspecified atrial fibrillation: Secondary | ICD-10-CM | POA: Diagnosis not present

## 2015-10-26 DIAGNOSIS — I5032 Chronic diastolic (congestive) heart failure: Secondary | ICD-10-CM | POA: Diagnosis not present

## 2015-10-26 DIAGNOSIS — F329 Major depressive disorder, single episode, unspecified: Secondary | ICD-10-CM | POA: Diagnosis not present

## 2015-10-26 DIAGNOSIS — I129 Hypertensive chronic kidney disease with stage 1 through stage 4 chronic kidney disease, or unspecified chronic kidney disease: Secondary | ICD-10-CM | POA: Diagnosis not present

## 2015-10-26 DIAGNOSIS — D649 Anemia, unspecified: Secondary | ICD-10-CM | POA: Diagnosis not present

## 2015-10-27 DIAGNOSIS — Z9181 History of falling: Secondary | ICD-10-CM | POA: Diagnosis not present

## 2015-10-27 DIAGNOSIS — E1122 Type 2 diabetes mellitus with diabetic chronic kidney disease: Secondary | ICD-10-CM | POA: Diagnosis not present

## 2015-10-27 DIAGNOSIS — N182 Chronic kidney disease, stage 2 (mild): Secondary | ICD-10-CM | POA: Diagnosis not present

## 2015-10-27 DIAGNOSIS — D649 Anemia, unspecified: Secondary | ICD-10-CM | POA: Diagnosis not present

## 2015-10-27 DIAGNOSIS — I2510 Atherosclerotic heart disease of native coronary artery without angina pectoris: Secondary | ICD-10-CM | POA: Diagnosis not present

## 2015-10-27 DIAGNOSIS — I129 Hypertensive chronic kidney disease with stage 1 through stage 4 chronic kidney disease, or unspecified chronic kidney disease: Secondary | ICD-10-CM | POA: Diagnosis not present

## 2015-10-27 DIAGNOSIS — I5032 Chronic diastolic (congestive) heart failure: Secondary | ICD-10-CM | POA: Diagnosis not present

## 2015-10-27 DIAGNOSIS — I4891 Unspecified atrial fibrillation: Secondary | ICD-10-CM | POA: Diagnosis not present

## 2015-10-27 DIAGNOSIS — F329 Major depressive disorder, single episode, unspecified: Secondary | ICD-10-CM | POA: Diagnosis not present

## 2015-10-29 DIAGNOSIS — D649 Anemia, unspecified: Secondary | ICD-10-CM | POA: Diagnosis not present

## 2015-10-29 DIAGNOSIS — E1122 Type 2 diabetes mellitus with diabetic chronic kidney disease: Secondary | ICD-10-CM | POA: Diagnosis not present

## 2015-10-29 DIAGNOSIS — I2510 Atherosclerotic heart disease of native coronary artery without angina pectoris: Secondary | ICD-10-CM | POA: Diagnosis not present

## 2015-10-29 DIAGNOSIS — Z9181 History of falling: Secondary | ICD-10-CM | POA: Diagnosis not present

## 2015-10-29 DIAGNOSIS — F329 Major depressive disorder, single episode, unspecified: Secondary | ICD-10-CM | POA: Diagnosis not present

## 2015-10-29 DIAGNOSIS — I129 Hypertensive chronic kidney disease with stage 1 through stage 4 chronic kidney disease, or unspecified chronic kidney disease: Secondary | ICD-10-CM | POA: Diagnosis not present

## 2015-10-29 DIAGNOSIS — I4891 Unspecified atrial fibrillation: Secondary | ICD-10-CM | POA: Diagnosis not present

## 2015-10-29 DIAGNOSIS — N182 Chronic kidney disease, stage 2 (mild): Secondary | ICD-10-CM | POA: Diagnosis not present

## 2015-10-29 DIAGNOSIS — I5032 Chronic diastolic (congestive) heart failure: Secondary | ICD-10-CM | POA: Diagnosis not present

## 2015-10-30 ENCOUNTER — Other Ambulatory Visit: Payer: Self-pay

## 2015-10-30 ENCOUNTER — Other Ambulatory Visit: Payer: Self-pay | Admitting: Internal Medicine

## 2015-10-30 NOTE — Telephone Encounter (Signed)
Incoming fax for requested med. Ok to fill?

## 2015-11-02 DIAGNOSIS — I129 Hypertensive chronic kidney disease with stage 1 through stage 4 chronic kidney disease, or unspecified chronic kidney disease: Secondary | ICD-10-CM | POA: Diagnosis not present

## 2015-11-02 DIAGNOSIS — Z9181 History of falling: Secondary | ICD-10-CM | POA: Diagnosis not present

## 2015-11-02 DIAGNOSIS — E1122 Type 2 diabetes mellitus with diabetic chronic kidney disease: Secondary | ICD-10-CM | POA: Diagnosis not present

## 2015-11-02 DIAGNOSIS — N182 Chronic kidney disease, stage 2 (mild): Secondary | ICD-10-CM | POA: Diagnosis not present

## 2015-11-02 DIAGNOSIS — F329 Major depressive disorder, single episode, unspecified: Secondary | ICD-10-CM | POA: Diagnosis not present

## 2015-11-02 DIAGNOSIS — I5032 Chronic diastolic (congestive) heart failure: Secondary | ICD-10-CM | POA: Diagnosis not present

## 2015-11-02 DIAGNOSIS — I2510 Atherosclerotic heart disease of native coronary artery without angina pectoris: Secondary | ICD-10-CM | POA: Diagnosis not present

## 2015-11-02 DIAGNOSIS — I4891 Unspecified atrial fibrillation: Secondary | ICD-10-CM | POA: Diagnosis not present

## 2015-11-02 DIAGNOSIS — D649 Anemia, unspecified: Secondary | ICD-10-CM | POA: Diagnosis not present

## 2015-11-04 DIAGNOSIS — I5032 Chronic diastolic (congestive) heart failure: Secondary | ICD-10-CM | POA: Diagnosis not present

## 2015-11-04 DIAGNOSIS — I129 Hypertensive chronic kidney disease with stage 1 through stage 4 chronic kidney disease, or unspecified chronic kidney disease: Secondary | ICD-10-CM | POA: Diagnosis not present

## 2015-11-04 DIAGNOSIS — I2510 Atherosclerotic heart disease of native coronary artery without angina pectoris: Secondary | ICD-10-CM | POA: Diagnosis not present

## 2015-11-04 DIAGNOSIS — N182 Chronic kidney disease, stage 2 (mild): Secondary | ICD-10-CM | POA: Diagnosis not present

## 2015-11-04 DIAGNOSIS — F329 Major depressive disorder, single episode, unspecified: Secondary | ICD-10-CM | POA: Diagnosis not present

## 2015-11-04 DIAGNOSIS — Z9181 History of falling: Secondary | ICD-10-CM | POA: Diagnosis not present

## 2015-11-04 DIAGNOSIS — E1122 Type 2 diabetes mellitus with diabetic chronic kidney disease: Secondary | ICD-10-CM | POA: Diagnosis not present

## 2015-11-04 DIAGNOSIS — D649 Anemia, unspecified: Secondary | ICD-10-CM | POA: Diagnosis not present

## 2015-11-04 DIAGNOSIS — I4891 Unspecified atrial fibrillation: Secondary | ICD-10-CM | POA: Diagnosis not present

## 2015-11-05 ENCOUNTER — Other Ambulatory Visit: Payer: Self-pay | Admitting: Internal Medicine

## 2015-11-05 DIAGNOSIS — I129 Hypertensive chronic kidney disease with stage 1 through stage 4 chronic kidney disease, or unspecified chronic kidney disease: Secondary | ICD-10-CM | POA: Diagnosis not present

## 2015-11-05 DIAGNOSIS — I4891 Unspecified atrial fibrillation: Secondary | ICD-10-CM | POA: Diagnosis not present

## 2015-11-05 DIAGNOSIS — I5032 Chronic diastolic (congestive) heart failure: Secondary | ICD-10-CM | POA: Diagnosis not present

## 2015-11-05 DIAGNOSIS — Z9181 History of falling: Secondary | ICD-10-CM | POA: Diagnosis not present

## 2015-11-05 DIAGNOSIS — D649 Anemia, unspecified: Secondary | ICD-10-CM | POA: Diagnosis not present

## 2015-11-05 DIAGNOSIS — I2510 Atherosclerotic heart disease of native coronary artery without angina pectoris: Secondary | ICD-10-CM | POA: Diagnosis not present

## 2015-11-05 DIAGNOSIS — E1122 Type 2 diabetes mellitus with diabetic chronic kidney disease: Secondary | ICD-10-CM | POA: Diagnosis not present

## 2015-11-05 DIAGNOSIS — N182 Chronic kidney disease, stage 2 (mild): Secondary | ICD-10-CM | POA: Diagnosis not present

## 2015-11-05 DIAGNOSIS — F329 Major depressive disorder, single episode, unspecified: Secondary | ICD-10-CM | POA: Diagnosis not present

## 2015-11-05 MED ORDER — FUROSEMIDE 40 MG PO TABS: ORAL_TABLET | ORAL | Status: DC

## 2015-11-05 NOTE — Telephone Encounter (Signed)
Can you check the sig on this before I send? Dr. Ronnald Ramp out of office

## 2015-11-05 NOTE — Addendum Note (Signed)
Addended by: Levonne Lapping on: 11/05/2015 05:01 PM   Modules accepted: Orders

## 2015-11-05 NOTE — Telephone Encounter (Signed)
Pt called in and needs refill on his furosemide (LASIX) 40 MG tablet Andrew [name]

## 2015-11-06 ENCOUNTER — Other Ambulatory Visit: Payer: Self-pay

## 2015-11-06 DIAGNOSIS — I5032 Chronic diastolic (congestive) heart failure: Secondary | ICD-10-CM | POA: Diagnosis not present

## 2015-11-06 DIAGNOSIS — N182 Chronic kidney disease, stage 2 (mild): Secondary | ICD-10-CM | POA: Diagnosis not present

## 2015-11-06 DIAGNOSIS — I129 Hypertensive chronic kidney disease with stage 1 through stage 4 chronic kidney disease, or unspecified chronic kidney disease: Secondary | ICD-10-CM | POA: Diagnosis not present

## 2015-11-06 DIAGNOSIS — Z9181 History of falling: Secondary | ICD-10-CM | POA: Diagnosis not present

## 2015-11-06 DIAGNOSIS — F329 Major depressive disorder, single episode, unspecified: Secondary | ICD-10-CM | POA: Diagnosis not present

## 2015-11-06 DIAGNOSIS — I4891 Unspecified atrial fibrillation: Secondary | ICD-10-CM | POA: Diagnosis not present

## 2015-11-06 DIAGNOSIS — E1122 Type 2 diabetes mellitus with diabetic chronic kidney disease: Secondary | ICD-10-CM | POA: Diagnosis not present

## 2015-11-06 DIAGNOSIS — D649 Anemia, unspecified: Secondary | ICD-10-CM | POA: Diagnosis not present

## 2015-11-06 DIAGNOSIS — I2510 Atherosclerotic heart disease of native coronary artery without angina pectoris: Secondary | ICD-10-CM | POA: Diagnosis not present

## 2015-11-06 MED ORDER — ATORVASTATIN CALCIUM 80 MG PO TABS: 80.0000 mg | ORAL_TABLET | Freq: Every day | ORAL | Status: DC

## 2015-11-09 NOTE — Telephone Encounter (Signed)
Error

## 2015-11-10 ENCOUNTER — Telehealth: Payer: Self-pay | Admitting: Neurology

## 2015-11-10 ENCOUNTER — Telehealth: Payer: Self-pay | Admitting: Internal Medicine
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
## 2015-11-10 NOTE — Telephone Encounter (Signed)
[name] Vinnie Level called in regards to him giving up his drivers license/Dawn CB# [phone]

## 2015-11-10 NOTE — Telephone Encounter (Signed)
Returned call. No answer. VM left.

## 2015-11-11 ENCOUNTER — Observation Stay (HOSPITAL_COMMUNITY)
Admission: EM | Admit: 2015-11-11 | Discharge: 2015-11-12 | Disposition: A | Discharge status: Home / Self Care | Payer: Medicare PPO | Attending: Cardiology | Admitting: Cardiology

## 2015-11-11 ENCOUNTER — Emergency Department (HOSPITAL_COMMUNITY): Payer: Medicare PPO

## 2015-11-11 ENCOUNTER — Encounter (HOSPITAL_COMMUNITY): Payer: Self-pay | Admitting: General Practice

## 2015-11-11 DIAGNOSIS — Z87891 Personal history of nicotine dependence: Secondary | ICD-10-CM | POA: Diagnosis not present

## 2015-11-11 DIAGNOSIS — Z9181 History of falling: Secondary | ICD-10-CM | POA: Diagnosis not present

## 2015-11-11 DIAGNOSIS — Z7982 Long term (current) use of aspirin: Secondary | ICD-10-CM | POA: Insufficient documentation

## 2015-11-11 DIAGNOSIS — E785 Hyperlipidemia, unspecified: Secondary | ICD-10-CM | POA: Insufficient documentation

## 2015-11-11 DIAGNOSIS — Z96653 Presence of artificial knee joint, bilateral: Secondary | ICD-10-CM | POA: Diagnosis not present

## 2015-11-11 DIAGNOSIS — K589 Irritable bowel syndrome without diarrhea: Secondary | ICD-10-CM | POA: Diagnosis not present

## 2015-11-11 DIAGNOSIS — J449 Chronic obstructive pulmonary disease, unspecified: Secondary | ICD-10-CM | POA: Insufficient documentation

## 2015-11-11 DIAGNOSIS — Z951 Presence of aortocoronary bypass graft: Secondary | ICD-10-CM | POA: Insufficient documentation

## 2015-11-11 DIAGNOSIS — Z7901 Long term (current) use of anticoagulants: Secondary | ICD-10-CM | POA: Insufficient documentation

## 2015-11-11 DIAGNOSIS — K219 Gastro-esophageal reflux disease without esophagitis: Secondary | ICD-10-CM | POA: Insufficient documentation

## 2015-11-11 DIAGNOSIS — R918 Other nonspecific abnormal finding of lung field: Secondary | ICD-10-CM | POA: Diagnosis not present

## 2015-11-11 DIAGNOSIS — Z79899 Other long term (current) drug therapy: Secondary | ICD-10-CM | POA: Insufficient documentation

## 2015-11-11 DIAGNOSIS — I110 Hypertensive heart disease with heart failure: Principal | ICD-10-CM | POA: Insufficient documentation

## 2015-11-11 DIAGNOSIS — F329 Major depressive disorder, single episode, unspecified: Secondary | ICD-10-CM | POA: Diagnosis not present

## 2015-11-11 DIAGNOSIS — I5043 Acute on chronic combined systolic (congestive) and diastolic (congestive) heart failure: Secondary | ICD-10-CM | POA: Diagnosis not present

## 2015-11-11 DIAGNOSIS — Z7902 Long term (current) use of antithrombotics/antiplatelets: Secondary | ICD-10-CM | POA: Diagnosis not present

## 2015-11-11 DIAGNOSIS — I480 Paroxysmal atrial fibrillation: Secondary | ICD-10-CM | POA: Diagnosis not present

## 2015-11-11 DIAGNOSIS — I255 Ischemic cardiomyopathy: Secondary | ICD-10-CM | POA: Diagnosis not present

## 2015-11-11 DIAGNOSIS — N183 Chronic kidney disease, stage 3 (moderate): Secondary | ICD-10-CM | POA: Diagnosis not present

## 2015-11-11 DIAGNOSIS — I2510 Atherosclerotic heart disease of native coronary artery without angina pectoris: Secondary | ICD-10-CM | POA: Insufficient documentation

## 2015-11-11 DIAGNOSIS — I6529 Occlusion and stenosis of unspecified carotid artery: Secondary | ICD-10-CM | POA: Diagnosis not present

## 2015-11-11 DIAGNOSIS — I4892 Unspecified atrial flutter: Secondary | ICD-10-CM | POA: Insufficient documentation

## 2015-11-11 DIAGNOSIS — I501 Left ventricular failure: Secondary | ICD-10-CM | POA: Diagnosis not present

## 2015-11-11 DIAGNOSIS — Z8673 Personal history of transient ischemic attack (TIA), and cerebral infarction without residual deficits: Secondary | ICD-10-CM | POA: Diagnosis not present

## 2015-11-11 DIAGNOSIS — I739 Peripheral vascular disease, unspecified: Secondary | ICD-10-CM | POA: Insufficient documentation

## 2015-11-11 DIAGNOSIS — R0602 Shortness of breath: Secondary | ICD-10-CM | POA: Diagnosis not present

## 2015-11-11 DIAGNOSIS — R0600 Dyspnea, unspecified: Secondary | ICD-10-CM

## 2015-11-11 HISTORY — DX: Cyst of kidney, acquired: N28.1

## 2015-11-11 HISTORY — DX: Reserved for inherently not codable concepts without codable children: IMO0001

## 2015-11-11 LAB — COMPREHENSIVE METABOLIC PANEL
ALBUMIN: 3.3 g/dL — AB (ref 3.5–5.0)
ALT: 89 U/L — ABNORMAL HIGH (ref 17–63)
ANION GAP: 9 (ref 5–15)
AST: 84 U/L — ABNORMAL HIGH (ref 15–41)
Alkaline Phosphatase: 75 U/L (ref 38–126)
BUN: 27 mg/dL — ABNORMAL HIGH (ref 6–20)
CO2: 24 mmol/L (ref 22–32)
Calcium: 8.9 mg/dL (ref 8.9–10.3)
Chloride: 110 mmol/L (ref 101–111)
Creatinine, Ser: 1.37 mg/dL — ABNORMAL HIGH (ref 0.61–1.24)
GFR calc non Af Amer: 45 mL/min — ABNORMAL LOW (ref >60)
GFR, EST AFRICAN AMERICAN: 53 mL/min — AB (ref >60)
GLUCOSE: 132 mg/dL — AB (ref 65–99)
POTASSIUM: 4.1 mmol/L (ref 3.5–5.1)
SODIUM: 143 mmol/L (ref 135–145)
Total Bilirubin: 0.3 mg/dL (ref 0.3–1.2)
Total Protein: 7.1 g/dL (ref 6.5–8.1)

## 2015-11-11 LAB — CBC
HEMATOCRIT: 32.4 % — AB (ref 39.0–52.0)
Hemoglobin: 10.1 g/dL — ABNORMAL LOW (ref 13.0–17.0)
MCH: 29.5 pg (ref 26.0–34.0)
MCHC: 31.2 g/dL (ref 30.0–36.0)
MCV: 94.7 fL (ref 78.0–100.0)
PLATELETS: 144 10*3/uL — AB (ref 150–400)
RBC: 3.42 MIL/uL — ABNORMAL LOW (ref 4.22–5.81)
RDW: 14.3 % (ref 11.5–15.5)
WBC: 7.7 10*3/uL (ref 4.0–10.5)

## 2015-11-11 LAB — I-STAT TROPONIN, ED: TROPONIN I, POC: 0.02 ng/mL (ref 0.00–0.08)

## 2015-11-11 LAB — URINE MICROSCOPIC-ADD ON: Bacteria, UA: NONE SEEN

## 2015-11-11 LAB — URINALYSIS, ROUTINE W REFLEX MICROSCOPIC
Bilirubin Urine: NEGATIVE
GLUCOSE, UA: NEGATIVE mg/dL
Ketones, ur: NEGATIVE mg/dL
LEUKOCYTES UA: NEGATIVE
NITRITE: NEGATIVE
PH: 5.5 (ref 5.0–8.0)
PROTEIN: NEGATIVE mg/dL
Specific Gravity, Urine: 1.01 (ref 1.005–1.030)

## 2015-11-11 LAB — BRAIN NATRIURETIC PEPTIDE: B Natriuretic Peptide: 506.6 pg/mL — ABNORMAL HIGH (ref 0.0–100.0)

## 2015-11-11 MED ORDER — LOSARTAN POTASSIUM 50 MG PO TABS
50.0000 mg | ORAL_TABLET | Freq: Every day | ORAL | Status: DC
  Administered 2015-11-11: 50 mg via ORAL
  Filled 2015-11-11: qty 1

## 2015-11-11 MED ORDER — ATORVASTATIN CALCIUM 80 MG PO TABS
80.0000 mg | ORAL_TABLET | Freq: Every day | ORAL | Status: DC
  Administered 2015-11-11 – 2015-11-12 (×2): 80 mg via ORAL
  Filled 2015-11-11 (×2): qty 1

## 2015-11-11 MED ORDER — SODIUM CHLORIDE 0.9 % IV SOLN: 250.0000 mL | INTRAVENOUS | Status: DC | PRN

## 2015-11-11 MED ORDER — SODIUM CHLORIDE 0.9 % IJ SOLN
3.0000 mL | Freq: Two times a day (BID) | INTRAMUSCULAR | Status: DC
  Administered 2015-11-11 – 2015-11-12 (×3): 3 mL via INTRAVENOUS

## 2015-11-11 MED ORDER — ALBUTEROL SULFATE (2.5 MG/3ML) 0.083% IN NEBU
5.0000 mg | INHALATION_SOLUTION | Freq: Once | RESPIRATORY_TRACT | Status: AC
  Administered 2015-11-11: 5 mg via RESPIRATORY_TRACT
  Filled 2015-11-11: qty 6

## 2015-11-11 MED ORDER — ALPRAZOLAM 0.5 MG PO TABS
0.5000 mg | ORAL_TABLET | Freq: Two times a day (BID) | ORAL | Status: DC | PRN
  Filled 2015-11-11: qty 1

## 2015-11-11 MED ORDER — POTASSIUM CHLORIDE CRYS ER 20 MEQ PO TBCR
20.0000 meq | EXTENDED_RELEASE_TABLET | Freq: Every day | ORAL | Status: DC
  Administered 2015-11-11 – 2015-11-12 (×2): 20 meq via ORAL
  Filled 2015-11-11 (×2): qty 1

## 2015-11-11 MED ORDER — ONDANSETRON HCL 4 MG/2ML IJ SOLN: 4.0000 mg | Freq: Four times a day (QID) | INTRAMUSCULAR | Status: DC | PRN

## 2015-11-11 MED ORDER — ACETAMINOPHEN 500 MG PO TABS
1000.0000 mg | ORAL_TABLET | Freq: Three times a day (TID) | ORAL | Status: DC | PRN
Start: 2015-11-11 — End: 2015-11-11

## 2015-11-11 MED ORDER — AMIODARONE HCL 200 MG PO TABS
200.0000 mg | ORAL_TABLET | Freq: Every day | ORAL | Status: DC
  Administered 2015-11-11 – 2015-11-12 (×2): 200 mg via ORAL
  Filled 2015-11-11 (×2): qty 1

## 2015-11-11 MED ORDER — FLUOXETINE HCL 20 MG PO CAPS
20.0000 mg | ORAL_CAPSULE | Freq: Every day | ORAL | Status: DC
  Administered 2015-11-11 – 2015-11-12 (×2): 20 mg via ORAL
  Filled 2015-11-11 (×2): qty 1

## 2015-11-11 MED ORDER — TAMSULOSIN HCL 0.4 MG PO CAPS
0.4000 mg | ORAL_CAPSULE | Freq: Every day | ORAL | Status: DC
  Administered 2015-11-11 – 2015-11-12 (×2): 0.4 mg via ORAL
  Filled 2015-11-11 (×2): qty 1

## 2015-11-11 MED ORDER — SODIUM CHLORIDE 0.9 % IJ SOLN: 3.0000 mL | INTRAMUSCULAR | Status: DC | PRN

## 2015-11-11 MED ORDER — EZETIMIBE 10 MG PO TABS
10.0000 mg | ORAL_TABLET | Freq: Every day | ORAL | Status: DC
  Administered 2015-11-11 – 2015-11-12 (×2): 10 mg via ORAL
  Filled 2015-11-11 (×2): qty 1

## 2015-11-11 MED ORDER — ACETAMINOPHEN 325 MG PO TABS: 650.0000 mg | ORAL_TABLET | ORAL | Status: DC | PRN

## 2015-11-11 MED ORDER — POLYETHYLENE GLYCOL 3350 17 G PO PACK: 17.0000 g | PACK | Freq: Every day | ORAL | Status: DC | PRN

## 2015-11-11 MED ORDER — FUROSEMIDE 10 MG/ML IJ SOLN
80.0000 mg | Freq: Once | INTRAMUSCULAR | Status: AC
  Administered 2015-11-11: 80 mg via INTRAVENOUS
  Filled 2015-11-11: qty 8

## 2015-11-11 MED ORDER — FUROSEMIDE 10 MG/ML IJ SOLN
80.0000 mg | Freq: Two times a day (BID) | INTRAMUSCULAR | Status: DC
  Administered 2015-11-11 – 2015-11-12 (×2): 80 mg via INTRAVENOUS
  Filled 2015-11-11 (×2): qty 8

## 2015-11-11 MED ORDER — ASPIRIN 81 MG PO CHEW
81.0000 mg | CHEWABLE_TABLET | Freq: Every day | ORAL | Status: DC
  Administered 2015-11-12: 81 mg via ORAL
  Filled 2015-11-11: qty 1

## 2015-11-11 MED ORDER — APIXABAN 2.5 MG PO TABS
2.5000 mg | ORAL_TABLET | Freq: Two times a day (BID) | ORAL | Status: DC
  Administered 2015-11-11 – 2015-11-12 (×3): 2.5 mg via ORAL
  Filled 2015-11-11 (×3): qty 1
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
## 2015-11-11 NOTE — Progress Notes (Signed)
During pt's skin assessment, on the BLLE, skin abnormalities noted (black irregularities cursted skin), according to the patient it is already treated (he was on ABX for that), pt refused to consult the wound nurse for that, will continue to monitor the patient.

## 2015-11-11 NOTE — Telephone Encounter (Signed)
Do you want him in for an OV to address?
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
## 2015-11-11 NOTE — Telephone Encounter (Signed)
Called pt wife to inform of note below. She could maybe call the dmv and see how to proceed on that part. If there was a form to fill on on competence we could help a little more. Other than that she will have to take the keys away. Message is for wife only 1st attempt LMVOM

## 2015-11-11 NOTE — Progress Notes (Signed)
CM went to speak with patient regarding d/c planning needs and support system but patient was resting with eyes closed. No family at the bedside. CM will continue to monitor for discharge planning needs.
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
## 2015-11-11 NOTE — ED Provider Notes (Signed)
CSN: [id number]     Arrival date & time 11/11/15  0757 History   First MD Initiated Contact with Patient 11/11/15 0801     Chief Complaint  Patient presents with  . Shortness of Breath  . Chest Pain   HPI  Andrew [name] is an 81 year old male with PMHx of CAD, HTN, CHF and a fib on amiodarone and eliquis presenting with chest pain and shortness of breath. Patient reports that he woke at approximately 3 AM with shortness of breath and chest pain. The chest pain is described as a pressure over his central chest. He took 2 nitroglycerin tablets which did not relieve the pressure. He also reports taking a "sleeping pill" because he thought this would help with the pain. He denies radiation of the pain. He states that he had an unusually active day yesterday and spent the day gardening. He denies having chest pain or shortness of breath during this time. When EMS arrived, patient was satting at 87% on room air. He denies a history of oxygen requirement. When placed on oxygen by EMS, he reports resolution of his chest pain. He is now complaining only of shortness of breath. He states that he is having trouble catching his breath when trying to speak. His wife is present at bedside reports that he ate a large japanese meal last evening before onset of CP and SOB. Denies fevers, chills, dizziness, syncope, headache, cough, abdominal pain, nausea or vomiting  Past Medical History  Diagnosis Date  . CAD (coronary artery disease)   . LVF (left ventricular failure) ([place])   . Carotid artery stenosis   . Hyperlipidemia   . Hypertension   . GERD (gastroesophageal reflux disease)   . IBS (irritable bowel syndrome)   . Arthritis   . Diabetes mellitus   . Thyroid disease   . Anemia   . Vitamin D deficiency   . Depression   . Fatty liver 2008  . Cancer ([place])     Melanoma - Back  . AAA (abdominal aortic aneurysm) ([place])   . Pneumonia Jan. 2014  . CHF (congestive heart failure) ([place])   . DVT (deep venous  thrombosis) ([place])   . Paroxysmal atrial fibrillation (HCC)   . Typical atrial flutter [facility])    Past Surgical History  Procedure Laterality Date  . Total knee arthroplasty      bilateral- Pt fell 3 steps  . Cardiac stents  2005  . Coronary artery bypass graft  1992  . Joint replacement      Bilateral knee arthroplasty  . Eye surgery      [place]  . Abdominal aortic aneurysm repair  2002  . Cardiac catheterization    . Left heart catheterization with coronary angiogram N/A 10/30/2014    Procedure: LEFT HEART CATHETERIZATION WITH CORONARY ANGIOGRAM;  Surgeon: Larey Dresser, MD;  Location: [facility] CATH LAB;  Service: Cardiovascular;  Laterality: N/A;  . [place]  June 2016    While in [place]  2016   Family History  Problem Relation Age of Onset  . Colon cancer Father   . Cancer Father   . Coronary artery disease Brother   . Diabetes Brother   . Cancer Brother     throat and abdominal  . Heart disease Brother   . Hyperlipidemia Brother   . Peripheral vascular disease Brother     Varicose Veins  . Diabetes Son   . Hyperlipidemia Son    Social History  Substance Use Topics  .  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 Smokeless tobacco: Never Used     Comment: quit atleast 25 yrs ago, per pt  . Alcohol Use: 1.8 oz/week    3 Shots of liquor per week     Comment: socially    Review of Systems  Constitutional: Negative for fever and chills.  HENT: Negative for congestion and rhinorrhea.   Respiratory: Positive for shortness of breath. Negative for cough.   Cardiovascular: Positive for chest pain and leg swelling. Negative for palpitations.  Gastrointestinal: Negative for nausea, vomiting and abdominal pain.  Genitourinary: Negative for dysuria and flank pain.  Musculoskeletal: Negative for myalgias, arthralgias and neck pain.  Skin: Negative for rash.  Neurological: Negative for dizziness, syncope and headaches.  All other systems reviewed and are negative.     Allergies  Metoprolol  and Metformin and related  Home Medications   Prior to Admission medications   Medication Sig Start Date End Date Taking? Authorizing Provider  acetaminophen (TYLENOL) 500 MG tablet Take 1,000 mg by mouth every 8 (eight) hours as needed for moderate pain.    Yes Historical Provider, MD  ALPRAZolam (XANAX) 0.5 MG tablet TAKE 1/2 TABLET TWICE DAILY AS NEEDED FOR ANXIETY. 11/01/15  Yes Janith Lima, MD  amiodarone (PACERONE) 200 MG tablet Take 1 tablet (200 mg total) by mouth daily. 09/10/15  Yes Larey Dresser, MD  aspirin EC 81 MG tablet Take 1 tablet (81 mg total) by mouth daily. 05/22/14  Yes Larey Dresser, MD  atorvastatin (LIPITOR) 80 MG tablet Take 1 tablet (80 mg total) by mouth daily. 11/06/15  Yes Larey Dresser, MD  cilostazol (PLETAL) 100 MG tablet Take 100 mg by mouth 2 (two) times daily.   Yes Historical Provider, MD  Coenzyme Q10 (CO Q 10 PO) Take 1 tablet by mouth daily.   Yes Historical Provider, MD  ELIQUIS 2.5 MG TABS tablet TAKE 1 TABLET TWICE DAILY. 10/21/15  Yes Larey Dresser, MD  ezetimibe (ZETIA) 10 MG tablet Take 10 mg by mouth daily.   Yes Historical Provider, MD  FLUoxetine (PROZAC) 20 MG capsule Take 1 capsule (20 mg total) by mouth daily. 03/05/15  Yes Janith Lima, MD  furosemide (LASIX) 40 MG tablet Take 80 mg (2 tablets) in the morning and 60 mg (1 and 1/2 tablets) in the afternoon Patient taking differently: Take 60-80 mg by mouth 2 (two) times daily. Take 80 mg (2 tablets) in the morning and 60 mg (1 and 1/2 tablets) in the afternoon 11/05/15  Yes Golden Circle, FNP  glucosamine-chondroitin 500-400 MG tablet Take 1 tablet by mouth 2 (two) times daily.    Yes Historical Provider, MD  losartan (COZAAR) 100 MG tablet Take 100 mg by mouth daily.   Yes Historical Provider, MD  Misc Natural Products (NF FORMULAS TESTOSTERONE) CAPS Take 1 capsule by mouth daily.    Yes Historical Provider, MD  Multiple Vitamin (MULTIVITAMIN) tablet Take 1 tablet by mouth daily.    Yes Historical Provider, MD  NIFEdipine (PROCARDIA-XL/ADALAT-CC/NIFEDICAL-XL) 30 MG 24 hr tablet Take 30 mg by mouth daily.   Yes Historical Provider, MD  omega-3 acid ethyl esters (LOVAZA) 1 G capsule Take 1 g by mouth 2 (two) times daily.   Yes Historical Provider, MD  potassium chloride SA (K-DUR,KLOR-CON) 20 MEQ tablet TAKE 1 TABLET ONCE DAILY. 09/21/15  Yes Janith Lima, MD  tamsulosin (FLOMAX) 0.4 MG CAPS capsule Take 0.4 mg by mouth daily.   Yes Historical Provider,  MD  temazepam (RESTORIL) 30 MG capsule TAKE 1 CAPSULE AT BEDTIME AS NEEDED FOR SLEEP. 09/14/15  Yes Janith Lima, MD  erythromycin with ethanol [facility]) 2 % external solution Apply 1 application topically daily as needed (for skin irritation around neck).    Historical Provider, MD  furosemide (LASIX) 40 MG tablet Take 80 mg (2 tablets) in the morning and 60 mg (1 and 1/2 tablets) in the afternoon Patient not taking: Reported on 11/11/2015 11/05/15   Golden Circle, FNP  Hypromellose (GENTEAL MILD) 0.2 % SOLN Place 1 drop into both eyes daily as needed (for dry eyes).     Historical Provider, MD  NITROSTAT 0.4 MG SL tablet DISSOLVE 1 TABLET UNDER TONGUE AS NEEDED FOR CHEST PAIN,MAY REPEAT IN5 MINUTES FOR 2 DOSES. 07/13/15   Janith Lima, MD  polyethylene glycol [facility] Floria Raveling) packet Take 17 g by mouth daily as needed (constipation).    Historical Provider, MD   BP 173/80 mmHg  Pulse 83  Temp(Src) 97.7 F (36.5 C) (Oral)  Resp 18  Ht 5' 6.5" (1.689 m)  Wt 180 lb 3.2 oz (81.738 kg)  BMI 28.65 kg/m2  SpO2 98% Physical Exam  Constitutional: He is oriented to person, place, and time. He appears well-developed and well-nourished. No distress.  Drowsy  HENT:  Head: Normocephalic and atraumatic.  Mouth/Throat: Oropharynx is clear and moist.  Eyes: Conjunctivae and EOM are normal. Pupils are equal, round, and reactive to light. Right eye exhibits no discharge. Left eye exhibits no discharge. No scleral icterus.   Neck: Normal range of motion. Neck supple.  Cardiovascular: Normal rate, regular rhythm and normal heart sounds.   2+ pitting edema.   Pulmonary/Chest: Effort normal. No respiratory distress. He has no wheezes. He has rhonchi.  Increased effort in breathing. Able to speak in short sentences though it obviously causes him to become short of breath. Scattered rhonchi. No wheezes or rales.  Abdominal: Soft. Bowel sounds are normal. He exhibits no distension. There is no tenderness.  Musculoskeletal: Normal range of motion. He exhibits no edema or tenderness.  Moves extremities spontaneously  Neurological: He is alert and oriented to person, place, and time. No cranial nerve deficit. He exhibits normal muscle tone. Coordination normal.  Drowsy but oriented to person, place and time. Cranial nerves III through XII tested and intact.  Skin: Skin is warm and dry.  Healed scabs to bilateral anterior shins.  Psychiatric: He has a normal mood and affect. His behavior is normal.  Nursing note and vitals reviewed.   ED Course  Procedures (including critical care time) Labs Review Labs Reviewed  CBC - Abnormal; Notable for the following:    RBC 3.42 (*)    Hemoglobin 10.1 (*)    HCT 32.4 (*)    Platelets 144 (*)    All other components within normal limits  COMPREHENSIVE METABOLIC PANEL - Abnormal; Notable for the following:    Glucose, Bld 132 (*)    BUN 27 (*)    Creatinine, Ser 1.37 (*)    Albumin 3.3 (*)    AST 84 (*)    ALT 89 (*)    GFR calc non Af Amer 45 (*)    GFR calc Af Amer 53 (*)    All other components within normal limits  BRAIN NATRIURETIC PEPTIDE - Abnormal; Notable for the following:    B Natriuretic Peptide 506.6 (*)    All other components within normal limits  URINALYSIS, ROUTINE W REFLEX MICROSCOPIC (  NOT AT [facility])  Randolm Idol, ED    Imaging Review Dg Chest 2 View  11/11/2015  CLINICAL DATA:  Shortness of breath, coronary artery disease, diabetes mellitus,  hypertension, hyperlipidemia, personal history of CHF an LEFT ventricular failure, paroxysmal atrial fibrillation EXAM: CHEST  2 VIEW COMPARISON:  07/11/2015 FINDINGS: Enlargement of cardiac silhouette post CABG. Atherosclerotic calcification aorta. Pulmonary vascular congestion. BILATERAL pulmonary infiltrates LEFT greater than RIGHT question asymmetric pulmonary edema versus infection. Portion of density in the LEFT mid lung is somewhat more confluent/ nodular in appearance requiring follow-up. No gross pleural effusion or pneumothorax. Persistent elevation of RIGHT diaphragm. Osseous demineralization with diffuse idiopathic skeletal hyperostosis. IMPRESSION: Enlargement of cardiac silhouette with pulmonary vascular congestion post CABG. Asymmetric infiltrates LEFT greater than RIGHT question asymmetric edema versus infection. Due to more confluent/nodular appearance of an opacity in the LEFT mid lung, followup radiographs until resolution recommended to exclude tumor. Electronically Signed   By: Lavonia Dana M.D.   On: 11/11/2015 08:57   I have personally reviewed and evaluated these images and lab results as part of my medical decision-making.   EKG Interpretation   Date/Time:  Wednesday November 11 2015 08:05:30 EST Ventricular Rate:  90 PR Interval:  201 QRS Duration: 143 QT Interval:  408 QTC Calculation: 499 R Axis:   -75 Text Interpretation:  Sinus or ectopic atrial rhythm Nonspecific IVCD with  LAD Consider anterior infarct No significant change since last tracing  Confirmed by Maryan Rued  MD, Loree Fee ([id number]) on 11/11/2015 8:12:08 AM      MDM   Final diagnoses:  Dyspnea   Patient presenting with chest pain and shortness of breath. Acute onset at 3 AM this morning area and pain not relieved with nitroglycerin. Patient found to be satting 87% on room air by EMS. Patient reports resolution of symptoms after 4 L nasal cannula. Patient is nontoxic appearing he is drowsy, likely due to  taking sleeping pill before arrival. Effort of breathing increased with rhonchi throughout. Nonfocal neuro exam. BNP 506. Troponin 0.02. EKG unchanged. Consulted to cardiology for CHF exacerbation and new oxygen requirement. Cardiology will admit.    Josephina Gip, PA-C 11/11/15 [place], MD 11/13/15 [id number]

## 2015-11-12 ENCOUNTER — Observation Stay (HOSPITAL_COMMUNITY): Payer: Medicare PPO

## 2015-11-12 DIAGNOSIS — R918 Other nonspecific abnormal finding of lung field: Secondary | ICD-10-CM | POA: Diagnosis not present

## 2015-11-12 DIAGNOSIS — R0600 Dyspnea, unspecified: Secondary | ICD-10-CM | POA: Diagnosis not present

## 2015-11-12 DIAGNOSIS — I5043 Acute on chronic combined systolic (congestive) and diastolic (congestive) heart failure: Secondary | ICD-10-CM | POA: Diagnosis not present

## 2015-11-12 DIAGNOSIS — I480 Paroxysmal atrial fibrillation: Secondary | ICD-10-CM | POA: Diagnosis not present

## 2015-11-12 DIAGNOSIS — I5032 Chronic diastolic (congestive) heart failure: Secondary | ICD-10-CM | POA: Diagnosis not present

## 2015-11-12 DIAGNOSIS — N183 Chronic kidney disease, stage 3 (moderate): Secondary | ICD-10-CM | POA: Diagnosis not present

## 2015-11-12 LAB — BASIC METABOLIC PANEL
ANION GAP: 8 (ref 5–15)
BUN: 31 mg/dL — ABNORMAL HIGH (ref 6–20)
CO2: 29 mmol/L (ref 22–32)
Calcium: 8.6 mg/dL — ABNORMAL LOW (ref 8.9–10.3)
Chloride: 105 mmol/L (ref 101–111)
Creatinine, Ser: 1.47 mg/dL — ABNORMAL HIGH (ref 0.61–1.24)
GFR calc Af Amer: 48 mL/min — ABNORMAL LOW (ref >60)
GFR calc non Af Amer: 42 mL/min — ABNORMAL LOW (ref >60)
GLUCOSE: 107 mg/dL — AB (ref 65–99)
POTASSIUM: 3.5 mmol/L (ref 3.5–5.1)
Sodium: 142 mmol/L (ref 135–145)

## 2015-11-12 MED ORDER — FUROSEMIDE 80 MG PO TABS: 80.0000 mg | ORAL_TABLET | Freq: Two times a day (BID) | ORAL | Status: DC

## 2015-11-12 MED ORDER — LOSARTAN POTASSIUM 50 MG PO TABS
100.0000 mg | ORAL_TABLET | Freq: Every day | ORAL | Status: DC
  Administered 2015-11-12: 100 mg via ORAL
  Filled 2015-11-12: qty 2

## 2015-11-12 MED ORDER — FUROSEMIDE 40 MG PO TABS: 80.0000 mg | ORAL_TABLET | Freq: Two times a day (BID) | ORAL | Status: DC
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
## 2015-11-12 NOTE — Discharge Instructions (Signed)
Heart Failure  Heart failure means your heart has trouble pumping blood. This makes it hard for your body to work well. Heart failure is usually a long-term (chronic) condition. You must take good care of yourself and follow your doctor's treatment plan.  HOME CARE   Take your heart medicine as told by your doctor.    Do not stop taking medicine unless your doctor tells you to.    Do not skip any dose of medicine.    Refill your medicines before they run out.    Take other medicines only as told by your doctor or pharmacist.   Stay active if told by your doctor. The elderly and people with severe heart failure should talk with a doctor about physical activity.   Eat heart-healthy foods. Choose foods that are without trans fat and are low in saturated fat, cholesterol, and salt (sodium). This includes fresh or frozen fruits and vegetables, fish, lean meats, fat-free or low-fat dairy foods, whole grains, and high-fiber foods. Lentils and dried peas and beans (legumes) are also good choices.   Limit salt if told by your doctor.   Cook in a healthy way. Roast, grill, broil, bake, poach, steam, or stir-fry foods.   Limit fluids as told by your doctor.   Weigh yourself every morning. Do this after you pee (urinate) and before you eat breakfast. Write down your weight to give to your doctor.   Take your blood pressure and write it down if your doctor tells you to.   Ask your doctor how to check your pulse. Check your pulse as told.   Lose weight if told by your doctor.   Stop smoking or chewing tobacco. Do not use gum or patches that help you quit without your doctor's approval.   Schedule and go to doctor visits as told.   Nonpregnant women should have no more than 1 drink a day. Men should have no more than 2 drinks a day. Talk to your doctor about drinking alcohol.   Stop illegal drug use.   Stay current with shots (immunizations).   Manage your health conditions as told by your doctor.   Learn to  manage your stress.   Rest when you are tired.   If it is really hot outside:    Avoid intense activities.    Use air conditioning or fans, or get in a cooler place.    Avoid caffeine and alcohol.    Wear loose-fitting, lightweight, and light-colored clothing.   If it is really cold outside:    Avoid intense activities.    Layer your clothing.    Wear mittens or gloves, a hat, and a scarf when going outside.    Avoid alcohol.   Learn about heart failure and get support as needed.   Get help to maintain or improve your quality of life and your ability to care for yourself as needed.  GET HELP IF:    You gain weight quickly.   You are more short of breath than usual.   You cannot do your normal activities.   You tire easily.   You cough more than normal, especially with activity.   You have any or more puffiness (swelling) in areas such as your hands, feet, ankles, or belly (abdomen).   You cannot sleep because it is hard to breathe.   You feel like your heart is beating fast (palpitations).   You get dizzy or light-headed when you stand up.  GET HELP   RIGHT AWAY IF:    You have trouble breathing.   There is a change in mental status, such as becoming less alert or not being able to focus.   You have chest pain or discomfort.   You faint.  MAKE SURE YOU:    Understand these instructions.   Will watch your condition.   Will get help right away if you are not doing well or get worse.     This information is not intended to replace advice given to you by your health care provider. Make sure you discuss any questions you have with your health care provider.     Document Released: 09/20/2008 Document Revised: 01/02/2015 Document Reviewed: 01/28/2013  Elsevier Interactive Patient Education 2016 [employer]

## 2015-11-12 NOTE — Telephone Encounter (Signed)
I agree with those recommendations.
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
## 2015-11-12 NOTE — Discharge Summary (Signed)
Pt got discharged, discharge instructions provided and patient showed understanding to it, IV taken out,Telemonitor DC,pt left unit in wheelchair with all of the belongings accompanied with his wife.

## 2015-11-13 ENCOUNTER — Telehealth: Payer: Self-pay

## 2015-11-13 NOTE — Telephone Encounter (Signed)
Pt is on TCM List  DC to HF clinic

## 2015-11-15 ENCOUNTER — Other Ambulatory Visit: Payer: Self-pay | Admitting: Cardiology

## 2015-11-16 ENCOUNTER — Other Ambulatory Visit: Payer: Self-pay | Admitting: Pharmacist

## 2015-11-16 NOTE — Patient Outreach (Signed)
[facility]) Care Management  [place]   11/16/2015  Hayston Pingree Hanlon July 13, 1928 [id number]  Subjective: Andrew [name] is a 81yo with a recent hospitalization from 11/11/15 to 11/12/15 for acute on chronic combined systolic/diastolic heart failure (EF 40-45% on 10/18/15).  I identified patient as eligible for pharmacy transition of care project.  Chart reviewed prior to initiation of call and medication review completed.  I called patient and read consent.  Patient declined to participate in transition of care call.    Patient has hospital follow up scheduled for 12/01/15 at 3:20 PM.       Objective:   Current Medications: Current Outpatient Prescriptions  Medication Sig Dispense Refill  . acetaminophen (TYLENOL) 500 MG tablet Take 1,000 mg by mouth every 8 (eight) hours as needed for moderate pain.     Marland Kitchen ALPRAZolam (XANAX) 0.5 MG tablet TAKE 1/2 TABLET TWICE DAILY AS NEEDED FOR ANXIETY. 60 tablet 3  . amiodarone (PACERONE) 200 MG tablet Take 1 tablet (200 mg total) by mouth daily.    Marland Kitchen aspirin EC 81 MG tablet Take 1 tablet (81 mg total) by mouth daily.    Marland Kitchen atorvastatin (LIPITOR) 80 MG tablet Take 1 tablet (80 mg total) by mouth daily. 30 tablet 6  . cilostazol (PLETAL) 100 MG tablet Take 100 mg by mouth 2 (two) times daily.    . Coenzyme Q10 (CO Q 10 PO) Take 1 tablet by mouth daily.    Marland Kitchen ELIQUIS 2.5 MG TABS tablet TAKE 1 TABLET TWICE DAILY. 60 tablet 0  . erythromycin with ethanol (THERAMYCIN) 2 % external solution Apply 1 application topically daily as needed (for skin irritation around neck).    . ezetimibe (ZETIA) 10 MG tablet Take 10 mg by mouth daily.    Marland Kitchen FLUoxetine (PROZAC) 20 MG capsule Take 1 capsule (20 mg total) by mouth daily. 90 capsule 3  . furosemide (LASIX) 40 MG tablet Take 2 tablets (80 mg total) by mouth 2 (two) times daily. T 120 tablet 6  . glucosamine-chondroitin 500-400 MG tablet Take 1 tablet by mouth 2 (two) times daily.     .  Hypromellose (GENTEAL MILD) 0.2 % SOLN Place 1 drop into both eyes daily as needed (for dry eyes).     Marland Kitchen losartan (COZAAR) 100 MG tablet Take 100 mg by mouth daily.    . Misc Natural Products (NF FORMULAS TESTOSTERONE) CAPS Take 1 capsule by mouth daily.     . Multiple Vitamin (MULTIVITAMIN) tablet Take 1 tablet by mouth daily.    Marland Kitchen NIFEdipine (PROCARDIA-XL/ADALAT-CC/NIFEDICAL-XL) 30 MG 24 hr tablet Take 30 mg by mouth daily.    Marland Kitchen NITROSTAT 0.4 MG SL tablet DISSOLVE 1 TABLET UNDER TONGUE AS NEEDED FOR CHEST PAIN,MAY REPEAT IN5 MINUTES FOR 2 DOSES. 25 tablet 3  . omega-3 acid ethyl esters (LOVAZA) 1 G capsule Take 1 g by mouth 2 (two) times daily.    . polyethylene glycol (MIRALAX / GLYCOLAX) packet Take 17 g by mouth daily as needed (constipation).    . potassium chloride SA (K-DUR,KLOR-CON) 20 MEQ tablet TAKE 1 TABLET ONCE DAILY. 30 tablet 11  . tamsulosin (FLOMAX) 0.4 MG CAPS capsule Take 0.4 mg by mouth daily.    . temazepam (RESTORIL) 30 MG capsule TAKE 1 CAPSULE AT BEDTIME AS NEEDED FOR SLEEP. 30 capsule 3   No current facility-administered medications for this visit.   Functional Status: In your present state of health, do you have any difficulty performing the following activities:  11/11/2015 10/17/2015  Hearing? Tempie Donning  Vision? N N  Difficulty concentrating or making decisions? N N  Walking or climbing stairs? Y N  Dressing or bathing? N N  Doing errands, shopping? N N   Fall/Depression Screening: PHQ 2/9 Scores 10/21/2015 09/03/2015 07/05/2015 03/24/2015 03/23/2015  PHQ - 2 Score 0 0 0 0 0    Assessment: 1.  Medication review:  Drugs sorted by system:  Neurologic/Psychologic: alprazolam, fluoxetine, temazepam   Cardiovascular: amiodarone, apixaban, aspirin, atorvastatin, cilostazol, ezetimibe, furosemide, losartan, nifedipine, nitroglycerin SL, omega-3-acid ethyl esters, potassium  Pulmonary/Allergy: none  Gastrointestinal: polyethylene glycol  Endocrine: none  Renal:  none  Topical: erythromycin solution, hypromellose solution  Pain: acetaminophen  Vitamins/Minerals: multivitamin  Infectious Diseases: none  Miscellaneous: coenzyme Q10, glucosamine-chondroitin, tamsulosin   Duplications in therapy: benzodiazepines - alprazolam and temazepam  Gaps in therapy: beta-blocker for CAD - patient is not currently on beta-blocker due to allergy (history of shortness of breath, palpitations with metoprolol) Medications to avoid in the elderly: benzodiazepines (increased risk of cognitive impairment, delirium, falls, fractures, and motor vehicle crashes in older adults) Drug interactions: Eliquis and aspirin - increased risk of bleeding Other issues noted: cilostazol should be avoided in patient's with heart failure - there is evidence of decreased survival rates with phosphodiesterase inhibitors in patients with class III-IV heart failure, but warning is for patients with heart failure of any severity   2.  Medication adherence:  Unable to assess  Plan: 1.  Medication review:  Noted patient is on cilostazol for PAD.  It is recommended to avoid cilostazol in heart failure. Cardiologist previously states unable to stop due to severe calf pain.  Please continue to reassess use of cilostazol weighing the risks versus benefits.  Patient is also on Eliquis and aspirin which increases risk of bleeding.  Careful clinical monitoring for signs and symptoms of bleeding is warranted.  Please use caution with medications on the [facility] list.  Will send a fax to patient's cardiologist and primary care provider with this information.    2.  Medication adherence:  Unable to assess  3.  Patient has follow up with cardiology on 12/01/15 at 3:20 PM.     Elisabeth Most, Pharm.D. Pharmacy Resident [place] [phone]

## 2015-12-01 ENCOUNTER — Ambulatory Visit (HOSPITAL_COMMUNITY)
Admission: RE | Admit: 2015-12-01 | Discharge: 2015-12-01 | Disposition: A | Discharge status: Home / Self Care | Payer: Medicare PPO | Source: Ambulatory Visit | Attending: Cardiology | Admitting: Cardiology

## 2015-12-01 ENCOUNTER — Encounter (HOSPITAL_COMMUNITY): Payer: Self-pay

## 2015-12-01 VITALS — BP 102/58 | HR 84 | Wt 184.5 lb

## 2015-12-01 DIAGNOSIS — I252 Old myocardial infarction: Secondary | ICD-10-CM | POA: Diagnosis not present

## 2015-12-01 DIAGNOSIS — I5042 Chronic combined systolic (congestive) and diastolic (congestive) heart failure: Secondary | ICD-10-CM | POA: Insufficient documentation

## 2015-12-01 DIAGNOSIS — Z7982 Long term (current) use of aspirin: Secondary | ICD-10-CM | POA: Insufficient documentation

## 2015-12-01 DIAGNOSIS — Z79899 Other long term (current) drug therapy: Secondary | ICD-10-CM | POA: Insufficient documentation

## 2015-12-01 DIAGNOSIS — I5032 Chronic diastolic (congestive) heart failure: Secondary | ICD-10-CM | POA: Diagnosis not present

## 2015-12-01 DIAGNOSIS — I4891 Unspecified atrial fibrillation: Secondary | ICD-10-CM | POA: Diagnosis not present

## 2015-12-01 DIAGNOSIS — K589 Irritable bowel syndrome without diarrhea: Secondary | ICD-10-CM | POA: Insufficient documentation

## 2015-12-01 DIAGNOSIS — Z951 Presence of aortocoronary bypass graft: Secondary | ICD-10-CM | POA: Insufficient documentation

## 2015-12-01 DIAGNOSIS — I5022 Chronic systolic (congestive) heart failure: Secondary | ICD-10-CM | POA: Diagnosis not present

## 2015-12-01 DIAGNOSIS — R079 Chest pain, unspecified: Secondary | ICD-10-CM | POA: Diagnosis not present

## 2015-12-01 DIAGNOSIS — I129 Hypertensive chronic kidney disease with stage 1 through stage 4 chronic kidney disease, or unspecified chronic kidney disease: Secondary | ICD-10-CM | POA: Insufficient documentation

## 2015-12-01 DIAGNOSIS — Z85820 Personal history of malignant melanoma of skin: Secondary | ICD-10-CM | POA: Diagnosis not present

## 2015-12-01 DIAGNOSIS — I255 Ischemic cardiomyopathy: Secondary | ICD-10-CM | POA: Diagnosis not present

## 2015-12-01 DIAGNOSIS — E785 Hyperlipidemia, unspecified: Secondary | ICD-10-CM | POA: Insufficient documentation

## 2015-12-01 DIAGNOSIS — E119 Type 2 diabetes mellitus without complications: Secondary | ICD-10-CM | POA: Insufficient documentation

## 2015-12-01 DIAGNOSIS — Z87891 Personal history of nicotine dependence: Secondary | ICD-10-CM | POA: Diagnosis not present

## 2015-12-01 DIAGNOSIS — I2510 Atherosclerotic heart disease of native coronary artery without angina pectoris: Secondary | ICD-10-CM | POA: Diagnosis not present

## 2015-12-01 DIAGNOSIS — I639 Cerebral infarction, unspecified: Secondary | ICD-10-CM

## 2015-12-01 DIAGNOSIS — Z7901 Long term (current) use of anticoagulants: Secondary | ICD-10-CM | POA: Insufficient documentation

## 2015-12-01 DIAGNOSIS — I6523 Occlusion and stenosis of bilateral carotid arteries: Secondary | ICD-10-CM | POA: Insufficient documentation

## 2015-12-01 DIAGNOSIS — K219 Gastro-esophageal reflux disease without esophagitis: Secondary | ICD-10-CM | POA: Diagnosis not present

## 2015-12-01 DIAGNOSIS — I739 Peripheral vascular disease, unspecified: Secondary | ICD-10-CM | POA: Diagnosis not present

## 2015-12-01 DIAGNOSIS — I480 Paroxysmal atrial fibrillation: Secondary | ICD-10-CM | POA: Diagnosis not present

## 2015-12-01 LAB — CBC
HEMATOCRIT: 31.3 % — AB (ref 39.0–52.0)
HEMOGLOBIN: 10 g/dL — AB (ref 13.0–17.0)
MCH: 29.5 pg (ref 26.0–34.0)
MCHC: 31.9 g/dL (ref 30.0–36.0)
MCV: 92.3 fL (ref 78.0–100.0)
Platelets: 166 10*3/uL (ref 150–400)
RBC: 3.39 MIL/uL — AB (ref 4.22–5.81)
RDW: 14.2 % (ref 11.5–15.5)
WBC: 6.8 10*3/uL (ref 4.0–10.5)

## 2015-12-01 LAB — COMPREHENSIVE METABOLIC PANEL
ALBUMIN: 3.5 g/dL (ref 3.5–5.0)
ALK PHOS: 55 U/L (ref 38–126)
ALT: 42 U/L (ref 17–63)
ANION GAP: 9 (ref 5–15)
AST: 43 U/L — AB (ref 15–41)
BILIRUBIN TOTAL: 0.6 mg/dL (ref 0.3–1.2)
BUN: 46 mg/dL — AB (ref 6–20)
CHLORIDE: 101 mmol/L (ref 101–111)
CO2: 26 mmol/L (ref 22–32)
Calcium: 8.9 mg/dL (ref 8.9–10.3)
Creatinine, Ser: 2.28 mg/dL — ABNORMAL HIGH (ref 0.61–1.24)
GFR, EST AFRICAN AMERICAN: 28 mL/min — AB (ref >60)
GFR, EST NON AFRICAN AMERICAN: 25 mL/min — AB (ref >60)
Glucose, Bld: 135 mg/dL — ABNORMAL HIGH (ref 65–99)
POTASSIUM: 4.7 mmol/L (ref 3.5–5.1)
SODIUM: 136 mmol/L (ref 135–145)
TOTAL PROTEIN: 6.9 g/dL (ref 6.5–8.1)

## 2015-12-01 MED ORDER — CARVEDILOL 3.125 MG PO TABS
3.1250 mg | ORAL_TABLET | Freq: Two times a day (BID) | ORAL | Status: DC
Start: 2015-12-01 — End: 2016-01-01

## 2015-12-01 NOTE — Patient Instructions (Signed)
Stop nifedipine  Start Carvedilol 6.25 mg Twice daily   Labs today  Your physician recommends that you schedule a follow-up appointment in: 1 month

## 2015-12-02 DIAGNOSIS — I5022 Chronic systolic (congestive) heart failure: Secondary | ICD-10-CM | POA: Insufficient documentation
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
## 2015-12-04 ENCOUNTER — Ambulatory Visit (INDEPENDENT_AMBULATORY_CARE_PROVIDER_SITE_OTHER): Payer: Medicare PPO | Admitting: Internal Medicine

## 2015-12-04 ENCOUNTER — Other Ambulatory Visit (INDEPENDENT_AMBULATORY_CARE_PROVIDER_SITE_OTHER): Payer: Medicare PPO

## 2015-12-04 ENCOUNTER — Encounter: Payer: Self-pay | Admitting: Internal Medicine

## 2015-12-04 VITALS — BP 136/60 | HR 65 | Temp 97.6°F | Ht 66.0 in | Wt 186.1 lb

## 2015-12-04 DIAGNOSIS — R195 Other fecal abnormalities: Secondary | ICD-10-CM

## 2015-12-04 DIAGNOSIS — S8992XA Unspecified injury of left lower leg, initial encounter: Secondary | ICD-10-CM | POA: Diagnosis not present

## 2015-12-04 DIAGNOSIS — E1122 Type 2 diabetes mellitus with diabetic chronic kidney disease: Secondary | ICD-10-CM

## 2015-12-04 DIAGNOSIS — I5032 Chronic diastolic (congestive) heart failure: Secondary | ICD-10-CM | POA: Diagnosis not present

## 2015-12-04 LAB — HEMOGLOBIN A1C: HEMOGLOBIN A1C: 6.8 % — AB (ref 4.6–6.5)

## 2015-12-04 NOTE — Patient Instructions (Addendum)
There is no sign of cellulitis or abscess at this time. Protect the shins as we discussed.  I reviewed Dr Oleh Genin' notes 12/01/15. He wants you to take carvedilol 3.125 mg twice a day to prevent heart failure.  Please take a probiotic , Florastor OR Align, every day if the bowels are loose. This will replace the normal bacteria which  are necessary for formation of normal stool and processing of food.

## 2015-12-04 NOTE — Progress Notes (Signed)
Pre visit review using our clinic review tool, if applicable. No additional management support is needed unless otherwise documented below in the visit note. 

## 2015-12-04 NOTE — Progress Notes (Signed)
   Subjective:    Patient ID: Andrew [name], male    DOB: 04/13/1928, 81 y.o.   MRN: [id number]  HPI He is here to rule out active cellulitis. He recently bruised his left shin & was concerned that this may be a recurrence of cellulitis. He has dense eschar lesions over the shins which are slowly improving.  He was seen 12/01/15 by Dr. [name] whe recommended continuing amiodarone. That note was reviewed. Thyroid function tests are up-to-date. TSH was mildly elevated at 5.086 on 10/17. He recommended taking carvedilol 3.125 mg twice a day; the patient is NOT doing this. He has continued the amiodarone.  He states that amiodarone may be causing loose stools.  His A1c was 5.9% on 10/17/59  Review of Systems He denies associated fever, chills, sweats, weight loss. There has been no purulence from the shin lesions.  Chest pain, palpitations, tachycardia, exertional dyspnea, paroxysmal nocturnal dyspnea, claudication or edema are absent.  He denies unexplained weight loss, abdominal pain, significant dyspepsia, dysphagia, melena, rectal bleeding, or persistently small caliber stools.     Objective:   Physical Exam  Pertinent or positive findings include: Pattern alopecia is present. He has a mustache. Bilateral ptosis is present. Heart sounds are markedly distant. There is neck vein distention 5 cm at 0. Large ventral hernia is present. He has 1/2+ pitting edema. Posterior tibial pulses are decreased. He has large irregular eschar lesions over the shins with associated hyperpigmentation around these. There is no evidence of cellulitis or pustule formation.  General appearance :adequately nourished; in no distress.  Eyes: No conjunctival inflammation or scleral icterus is present.  Lungs:Chest clear to auscultation; no wheezes, rhonchi,rales ,or rubs present.No increased work of breathing.   Abdomen: bowel sounds normal, soft and non-tender without masses, or  organomegaly  noted.  No  guarding or rebound.   Vascular : all pulses equal ; no bruits present.  Skin:Warm & dry.  Intact without suspicious lesions or rashes ; no tenting or jaundice   Lymphatic: No lymphadenopathy is noted about the head, neck, axilla.   Neuro: Strength, tone decreased.     Assessment & Plan:  #1 shin injury; no evidence of cellulitis. Prior wounds with associated cellulitis had resolved. He was reassured that there is no evidence of active cellulitis.  #2 chronic CHF. He'll be asked to verify that he is taking carvedilol as directed by his cardiologist.  #3 loose stools which he attributes to amiodarone. A probiotic will be recommended as needed only.  #4 hyperglycemia; off DM meds. A1c recheck

## 2015-12-08 ENCOUNTER — Telehealth (HOSPITAL_COMMUNITY): Payer: Self-pay | Admitting: *Deleted

## 2015-12-08 MED ORDER — LOSARTAN POTASSIUM 100 MG PO TABS: 50.0000 mg | ORAL_TABLET | Freq: Every day | ORAL | Status: DC

## 2015-12-08 MED ORDER — FUROSEMIDE 40 MG PO TABS: ORAL_TABLET | ORAL | Status: DC

## 2015-12-08 NOTE — Telephone Encounter (Signed)
Notes Recorded by [name], RN on 12/08/2015 at 2:43 PM Finally spoke w/he is aware, agreeable and verbalizes understanding of med changes, repeat labs sch for 12/19

## 2015-12-08 NOTE — Telephone Encounter (Signed)
-----   Message from Larey Dresser, MD sent at 12/01/2015 10:29 PM EST ----- Creatinine is higher.  Decrease Lasix to 80 qam/40 qpm and decrease losartan to 50 mg daily.  BMET in 1 week.

## 2015-12-12 DIAGNOSIS — I5032 Chronic diastolic (congestive) heart failure: Secondary | ICD-10-CM | POA: Diagnosis not present

## 2015-12-14 ENCOUNTER — Ambulatory Visit (HOSPITAL_COMMUNITY)
Admission: RE | Admit: 2015-12-14 | Discharge: 2015-12-14 | Disposition: A | Discharge status: Home / Self Care | Payer: Medicare PPO | Source: Ambulatory Visit | Attending: Cardiology | Admitting: Cardiology

## 2015-12-14 DIAGNOSIS — I5022 Chronic systolic (congestive) heart failure: Secondary | ICD-10-CM | POA: Diagnosis not present

## 2015-12-14 LAB — BASIC METABOLIC PANEL
Anion gap: 8 (ref 5–15)
BUN: 29 mg/dL — AB (ref 6–20)
CHLORIDE: 106 mmol/L (ref 101–111)
CO2: 28 mmol/L (ref 22–32)
CREATININE: 1.58 mg/dL — AB (ref 0.61–1.24)
Calcium: 9.1 mg/dL (ref 8.9–10.3)
GFR, EST AFRICAN AMERICAN: 44 mL/min — AB (ref >60)
GFR, EST NON AFRICAN AMERICAN: 38 mL/min — AB (ref >60)
Glucose, Bld: 176 mg/dL — ABNORMAL HIGH (ref 65–99)
POTASSIUM: 4.3 mmol/L (ref 3.5–5.1)
SODIUM: 142 mmol/L (ref 135–145)

## 2015-12-29 ENCOUNTER — Encounter: Payer: Self-pay | Admitting: Podiatry

## 2015-12-29 ENCOUNTER — Ambulatory Visit (INDEPENDENT_AMBULATORY_CARE_PROVIDER_SITE_OTHER): Payer: Medicare PPO | Admitting: Podiatry

## 2015-12-29 DIAGNOSIS — B351 Tinea unguium: Secondary | ICD-10-CM | POA: Diagnosis not present

## 2015-12-29 DIAGNOSIS — M79676 Pain in unspecified toe(s): Secondary | ICD-10-CM | POA: Diagnosis not present

## 2015-12-29 NOTE — Progress Notes (Signed)
Patient ID: MONDRE CROME, male   DOB: 04-28-1928, 82 y.o.   MRN: [id number] Complaint:  Visit Type: Patient returns to my office for continued preventative foot care services. Complaint: Patient states" my nails have grown long and thick and become painful to walk and wear shoes" Patient has been diagnosed with DM with no foot complications. The patient presents for preventative foot care services. No changes to ROS  Podiatric Exam: Vascular: dorsalis pedis and posterior tibial pulses are palpable bilateral. Capillary return is immediate. Temperature gradient is WNL. Skin turgor WNL  Sensorium: Normal Semmes Weinstein monofilament test. Normal tactile sensation bilaterally. Nail Exam: Pt has thick disfigured discolored nails with subungual debris noted bilateral entire nail hallux through fifth toenails Ulcer Exam: There is no evidence of ulcer or pre-ulcerative changes or infection. Orthopedic Exam: Muscle tone and strength are WNL. No limitations in general ROM. No crepitus or effusions noted. Foot type and digits show no abnormalities. Bony prominences are unremarkable. 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 Skin: No Porokeratosis. No infection or ulcers  Diagnosis:  Onychomycosis, , Pain in right toe, pain in left toes  Treatment & Plan Procedures and Treatment: Consent by patient was obtained for treatment procedures. The patient understood the discussion of treatment and procedures well. All questions were answered thoroughly reviewed. Debridement of mycotic and hypertrophic toenails, 1 through 5 bilateral and clearing of subungual debris. No ulceration, no infection noted.  Return Visit-Office Procedure: Patient instructed to return to the office for a follow up visit 3 months for continued evaluation and treatment.   Gardiner Barefoot DPM

## 2016-01-01 ENCOUNTER — Ambulatory Visit (HOSPITAL_COMMUNITY)
Admission: RE | Admit: 2016-01-01 | Discharge: 2016-01-01 | Disposition: A | Discharge status: Home / Self Care | Payer: Medicare Other | Source: Ambulatory Visit | Attending: Cardiology | Admitting: Cardiology

## 2016-01-01 VITALS — BP 116/54 | HR 79 | Wt 181.0 lb

## 2016-01-01 DIAGNOSIS — Z7982 Long term (current) use of aspirin: Secondary | ICD-10-CM | POA: Insufficient documentation

## 2016-01-01 DIAGNOSIS — Z951 Presence of aortocoronary bypass graft: Secondary | ICD-10-CM | POA: Diagnosis not present

## 2016-01-01 DIAGNOSIS — Z79899 Other long term (current) drug therapy: Secondary | ICD-10-CM | POA: Diagnosis not present

## 2016-01-01 DIAGNOSIS — Z7901 Long term (current) use of anticoagulants: Secondary | ICD-10-CM | POA: Insufficient documentation

## 2016-01-01 DIAGNOSIS — I2510 Atherosclerotic heart disease of native coronary artery without angina pectoris: Secondary | ICD-10-CM | POA: Insufficient documentation

## 2016-01-01 DIAGNOSIS — I4891 Unspecified atrial fibrillation: Secondary | ICD-10-CM | POA: Insufficient documentation

## 2016-01-01 DIAGNOSIS — J449 Chronic obstructive pulmonary disease, unspecified: Secondary | ICD-10-CM | POA: Insufficient documentation

## 2016-01-01 DIAGNOSIS — Z87891 Personal history of nicotine dependence: Secondary | ICD-10-CM | POA: Diagnosis not present

## 2016-01-01 DIAGNOSIS — E119 Type 2 diabetes mellitus without complications: Secondary | ICD-10-CM | POA: Diagnosis not present

## 2016-01-01 DIAGNOSIS — I739 Peripheral vascular disease, unspecified: Secondary | ICD-10-CM | POA: Diagnosis not present

## 2016-01-01 DIAGNOSIS — I5022 Chronic systolic (congestive) heart failure: Secondary | ICD-10-CM | POA: Insufficient documentation

## 2016-01-01 DIAGNOSIS — I255 Ischemic cardiomyopathy: Secondary | ICD-10-CM | POA: Diagnosis not present

## 2016-01-01 DIAGNOSIS — N183 Chronic kidney disease, stage 3 unspecified: Secondary | ICD-10-CM

## 2016-01-01 DIAGNOSIS — I129 Hypertensive chronic kidney disease with stage 1 through stage 4 chronic kidney disease, or unspecified chronic kidney disease: Secondary | ICD-10-CM | POA: Diagnosis not present

## 2016-01-01 DIAGNOSIS — I480 Paroxysmal atrial fibrillation: Secondary | ICD-10-CM | POA: Diagnosis not present

## 2016-01-01 DIAGNOSIS — E785 Hyperlipidemia, unspecified: Secondary | ICD-10-CM | POA: Insufficient documentation

## 2016-01-01 DIAGNOSIS — I6523 Occlusion and stenosis of bilateral carotid arteries: Secondary | ICD-10-CM | POA: Diagnosis not present

## 2016-01-01 DIAGNOSIS — Z8673 Personal history of transient ischemic attack (TIA), and cerebral infarction without residual deficits: Secondary | ICD-10-CM | POA: Insufficient documentation

## 2016-01-01 LAB — COMPREHENSIVE METABOLIC PANEL
ALT: 43 U/L (ref 17–63)
AST: 42 U/L — AB (ref 15–41)
Albumin: 3.4 g/dL — ABNORMAL LOW (ref 3.5–5.0)
Alkaline Phosphatase: 55 U/L (ref 38–126)
Anion gap: 9 (ref 5–15)
BUN: 45 mg/dL — AB (ref 6–20)
CHLORIDE: 104 mmol/L (ref 101–111)
CO2: 28 mmol/L (ref 22–32)
CREATININE: 1.81 mg/dL — AB (ref 0.61–1.24)
Calcium: 8.8 mg/dL — ABNORMAL LOW (ref 8.9–10.3)
GFR calc Af Amer: 38 mL/min — ABNORMAL LOW (ref >60)
GFR calc non Af Amer: 32 mL/min — ABNORMAL LOW (ref >60)
Glucose, Bld: 139 mg/dL — ABNORMAL HIGH (ref 65–99)
POTASSIUM: 4.2 mmol/L (ref 3.5–5.1)
SODIUM: 141 mmol/L (ref 135–145)
Total Bilirubin: 0.4 mg/dL (ref 0.3–1.2)
Total Protein: 6.7 g/dL (ref 6.5–8.1)

## 2016-01-01 LAB — CBC
HEMATOCRIT: 30.1 % — AB (ref 39.0–52.0)
Hemoglobin: 9.6 g/dL — ABNORMAL LOW (ref 13.0–17.0)
MCH: 29.7 pg (ref 26.0–34.0)
MCHC: 31.9 g/dL (ref 30.0–36.0)
MCV: 93.2 fL (ref 78.0–100.0)
PLATELETS: 139 10*3/uL — AB (ref 150–400)
RBC: 3.23 MIL/uL — AB (ref 4.22–5.81)
RDW: 14.5 % (ref 11.5–15.5)
WBC: 5.9 10*3/uL (ref 4.0–10.5)

## 2016-01-01 MED ORDER — CARVEDILOL 6.25 MG PO TABS: 6.2500 mg | ORAL_TABLET | Freq: Two times a day (BID) | ORAL | Status: DC
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
## 2016-01-01 NOTE — Addendum Note (Signed)
Encounter addended by: [name], RN on: 01/01/2016  9:59 AM<BR>     Documentation filed: Orders, Dx Association, Patient Instructions Section

## 2016-01-01 NOTE — Patient Instructions (Signed)
Increase Carvedilol to 6.25 mg Twice daily, we have sent you in a new prescription for 6.25 mg tablets  Labs today  We will contact you in 3 months to schedule your next appointment.

## 2016-01-03 ENCOUNTER — Other Ambulatory Visit: Payer: Self-pay | Admitting: Cardiology

## 2016-01-05 ENCOUNTER — Ambulatory Visit: Payer: Medicare PPO | Admitting: Internal Medicine

## 2016-01-12 ENCOUNTER — Encounter: Payer: Self-pay | Admitting: Neurology

## 2016-01-12 ENCOUNTER — Ambulatory Visit (INDEPENDENT_AMBULATORY_CARE_PROVIDER_SITE_OTHER): Payer: Medicare Other | Admitting: Neurology

## 2016-01-12 ENCOUNTER — Other Ambulatory Visit: Payer: Self-pay | Admitting: Cardiology

## 2016-01-12 VITALS — BP 124/60 | HR 77 | Wt 182.3 lb

## 2016-01-12 DIAGNOSIS — I63441 Cerebral infarction due to embolism of right cerebellar artery: Secondary | ICD-10-CM | POA: Diagnosis not present

## 2016-01-12 DIAGNOSIS — E1122 Type 2 diabetes mellitus with diabetic chronic kidney disease: Secondary | ICD-10-CM

## 2016-01-12 DIAGNOSIS — I480 Paroxysmal atrial fibrillation: Secondary | ICD-10-CM

## 2016-01-12 DIAGNOSIS — E785 Hyperlipidemia, unspecified: Secondary | ICD-10-CM | POA: Diagnosis not present

## 2016-01-12 DIAGNOSIS — I6529 Occlusion and stenosis of unspecified carotid artery: Secondary | ICD-10-CM | POA: Insufficient documentation

## 2016-01-12 DIAGNOSIS — I6521 Occlusion and stenosis of right carotid artery: Secondary | ICD-10-CM

## 2016-01-12 NOTE — Patient Instructions (Signed)
Continue Eliquis Continue Lipitor and Zetia, however have Dr. Ronnald Ramp adjust dose further as LDL goal should now be less than 70 Blood pressure and glycemic control Monitor carotid arteries with vascular surgeon Mediterranean diet    Why follow it? Research shows. . Those who follow the Mediterranean diet have a reduced risk of heart disease  . The diet is associated with a reduced incidence of Parkinson's and Alzheimer's diseases . People following the diet may have longer life expectancies and lower rates of chronic diseases  . The Dietary Guidelines for Americans recommends the Mediterranean diet as an eating plan to promote health and prevent disease  What Is the Mediterranean Diet?  . Healthy eating plan based on typical foods and recipes of Mediterranean-style cooking . The diet is primarily a plant based diet; these foods should make up a majority of meals   Starches - Plant based foods should make up a majority of meals - They are an important sources of vitamins, minerals, energy, antioxidants, and fiber - Choose whole grains, foods high in fiber and minimally processed items  - Typical grain sources include wheat, oats, barley, corn, brown rice, bulgar, farro, millet, polenta, couscous  - Various types of beans include chickpeas, lentils, fava beans, black beans, white beans   Fruits  Veggies - Large quantities of antioxidant rich fruits & veggies; 6 or more servings  - Vegetables can be eaten raw or lightly drizzled with oil and cooked  - Vegetables common to the traditional Mediterranean Diet include: artichokes, arugula, beets, broccoli, brussel sprouts, cabbage, carrots, celery, collard greens, cucumbers, eggplant, kale, leeks, lemons, lettuce, mushrooms, okra, onions, peas, peppers, potatoes, pumpkin, radishes, rutabaga, shallots, spinach, sweet potatoes, turnips, zucchini - Fruits common to the Mediterranean Diet include: apples, apricots, avocados, cherries, clementines, dates,  figs, grapefruits, grapes, melons, nectarines, oranges, peaches, pears, pomegranates, strawberries, tangerines  Fats - Replace butter and margarine with healthy oils, such as olive oil, canola oil, and tahini  - Limit nuts to no more than a handful a day  - Nuts include walnuts, almonds, pecans, pistachios, pine nuts  - Limit or avoid candied, honey roasted or heavily salted nuts - Olives are central to the [employer] - can be eaten whole or used in a variety of dishes   Meats Protein - Limiting red meat: no more than a few times a month - When eating red meat: choose lean cuts and keep the portion to the size of deck of cards - Eggs: approx. 0 to 4 times a week  - Fish and lean poultry: at least 2 a week  - Healthy protein sources include, chicken, [place], lean beef, lamb - Increase intake of seafood such as tuna, salmon, trout, mackerel, shrimp, scallops - Avoid or limit high fat processed meats such as sausage and bacon  Dairy - Include moderate amounts of low fat dairy products  - Focus on healthy dairy such as fat free yogurt, skim milk, low or reduced fat cheese - Limit dairy products higher in fat such as whole or 2% milk, cheese, ice cream  Alcohol - Moderate amounts of red wine is ok  - No more than 5 oz daily for women (all ages) and men older than age 44  - No more than 10 oz of wine daily for men younger than 43  Other - Limit sweets and other desserts  - Use herbs and spices instead of salt to flavor foods  - Herbs and spices common to the traditional Mediterranean  Diet include: basil, bay leaves, chives, cloves, cumin, fennel, garlic, lavender, marjoram, mint, oregano, parsley, pepper, rosemary, sage, savory, sumac, tarragon, thyme   It's not just a diet, it's a lifestyle:  . The Mediterranean diet includes lifestyle factors typical of those in the region  . Foods, drinks and meals are best eaten with others and savored . Daily physical activity is important for  overall good health . This could be strenuous exercise like running and aerobics . This could also be more leisurely activities such as walking, housework, yard-work, or taking the stairs . Moderation is the key; a balanced and healthy diet accommodates most foods and drinks . Consider portion sizes and frequency of consumption of certain foods   Meal Ideas & Options:  . Breakfast:  o Whole wheat toast or whole wheat English muffins with peanut butter & hard boiled egg o Steel cut oats topped with apples & cinnamon and skim milk  o Fresh fruit: banana, strawberries, melon, berries, peaches  o Smoothies: strawberries, bananas, greek yogurt, peanut butter o Low fat greek yogurt with blueberries and granola  o Egg white omelet with spinach and mushrooms o Breakfast couscous: whole wheat couscous, apricots, skim milk, cranberries  . Sandwiches:  o Hummus and grilled vegetables (peppers, zucchini, squash) on whole wheat bread   o Grilled chicken on whole wheat pita with lettuce, tomatoes, cucumbers or tzatziki  o Tuna salad on whole wheat bread: tuna salad made with greek yogurt, olives, red peppers, capers, green onions o Garlic rosemary lamb pita: lamb sauted with garlic, rosemary, salt & pepper; add lettuce, cucumber, greek yogurt to pita - flavor with lemon juice and black pepper  . Seafood:  o Mediterranean grilled salmon, seasoned with garlic, basil, parsley, lemon juice and black pepper o Shrimp, lemon, and spinach whole-grain pasta salad made with low fat greek yogurt  o Seared scallops with lemon orzo  o Seared tuna steaks seasoned salt, pepper, coriander topped with tomato mixture of olives, tomatoes, olive oil, minced garlic, parsley, green onions and cappers  . Meats:  o Herbed greek chicken salad with kalamata olives, cucumber, feta  o Red bell peppers stuffed with spinach, bulgur, lean ground beef (or lentils) & topped with feta   o Kebabs: skewers of chicken, tomatoes, onions,  zucchini, squash  o [place] burgers: made with red onions, mint, dill, lemon juice, feta cheese topped with roasted red peppers . Vegetarian o Cucumber salad: cucumbers, artichoke hearts, celery, red onion, feta cheese, tossed in olive oil & lemon juice  o Hummus and whole grain pita points with a greek salad (lettuce, tomato, feta, olives, cucumbers, red onion) o Lentil soup with celery, carrots made with vegetable broth, garlic, salt and pepper  o Tabouli salad: parsley, bulgur, mint, scallions, cucumbers, tomato, radishes, lemon juice, olive oil, salt and pepper. Exercise Follow up as needed

## 2016-01-12 NOTE — Progress Notes (Signed)
NEUROLOGY FOLLOW UP OFFICE NOTE  BURK RAGNO [id number]  HISTORY OF PRESENT ILLNESS: Andrew [name] is an 82 year old right-handed male with paroxysmal atrial fibrillation (on anticoagulation), peripheral vascular disease, carotid artery stenosis, hypertension, CKD stage 3, hyperlipidemia, CHF, who follows up for acute stroke.  Labs reviewed.  UPDATE: He was advised to continue ASA, Eliquis, Lipitor and Zetia.  He has been doing well.  No recurrent symptoms, including dizziness.  Currently he has some low back pain, which is being evaluated by another physician.  Hgb A1c 6.8  HISTORY: He has had multiple medical issues over the summer.  In May, he developed acute back pain that required injections in the back.  Then, he developed CHF exacerbation while on a cruise in the [place].  He was hospitalized in [place] for 2 weeks.  When he got home, he became ill and was found to have a UTI.  He then developed cellulitis on his legs.  After recovering from the UTI, he suddenly noted problems with balance.  While walking, he feels pretty good.  If he stands still, he feels his body swaying.  He denies dizziness, leg weakness or inability feeling the ground.  He denies neck pain or urinary incontinence.  He no longer has back pain.  He had two falls.  One fall occurred while he was outside in the yard trimming the hedges.  He turned and lost his balance.  He had another fall, in which he lost his balance when turning his body.  He currently receives home PT, which has helped.  In hindsight, he has had mild balance problems in the past, but never as pronounced as now.    MRI of brain from 10/16/15 showed acute 1.5 cm ischemic infarct in the right cerebellum.  He was admitted to [facility] for further evaluation.  MRA of head showed nonvisualization of the right PICA.  Echo showed EF 40-45%.  Carotid doppler showed 1-39% bilateral ICA stenosis based on diastolic velocity, but [id number] based on  systolic velocity and ratio.  LDL from October was 74.    He has right carotid artery disease with 40-59% stenosis, which is monitored annually.  PAST MEDICAL HISTORY: Past Medical History  Diagnosis Date  . CAD (coronary artery disease)   . LVF (left ventricular failure) ([place])   . Carotid artery stenosis   . Hyperlipidemia   . Hypertension   . GERD (gastroesophageal reflux disease)   . IBS (irritable bowel syndrome)   . Arthritis   . Diabetes mellitus   . Thyroid disease   . Anemia   . Vitamin D deficiency   . Depression   . Fatty liver 2008  . Cancer ([place])     Melanoma - Back  . AAA (abdominal aortic aneurysm) ([place]   . Pneumonia Jan. 2014  . CHF (congestive heart failure) ([place])   . DVT (deep venous thrombosis) ([place])   . Paroxysmal atrial fibrillation ([facility])   . Typical atrial flutter ([place])   . Shortness of breath dyspnea   . Cyst of kidney, acquired     MEDICATIONS: Current Outpatient Prescriptions on File Prior to Visit  Medication Sig Dispense Refill  . acetaminophen (TYLENOL) 500 MG tablet Take 1,000 mg by mouth every 8 (eight) hours as needed for moderate pain.     Marland Kitchen ALPRAZolam (XANAX) 0.5 MG tablet TAKE 1/2 TABLET TWICE DAILY AS NEEDED FOR ANXIETY. 60 tablet 3  . amiodarone (PACERONE) 200 MG tablet Take 1 tablet (200 mg  total) by mouth daily.    Marland Kitchen aspirin EC 81 MG tablet Take 1 tablet (81 mg total) by mouth daily.    Marland Kitchen atorvastatin (LIPITOR) 80 MG tablet Take 1 tablet (80 mg total) by mouth daily. 30 tablet 6  . carvedilol (COREG) 6.25 MG tablet Take 1 tablet (6.25 mg total) by mouth 2 (two) times daily. 60 tablet 3  . cilostazol (PLETAL) 100 MG tablet Take 100 mg by mouth 2 (two) times daily.    . Coenzyme Q10 (CO Q 10 PO) Take 1 tablet by mouth daily.    Marland Kitchen ELIQUIS 2.5 MG TABS tablet TAKE 1 TABLET TWICE DAILY. 60 tablet 3  . erythromycin with ethanol (THERAMYCIN) 2 % external solution Apply 1 application topically daily as needed (for skin irritation around neck).     . ezetimibe (ZETIA) 10 MG tablet Take 10 mg by mouth daily.    Marland Kitchen FLUoxetine (PROZAC) 20 MG capsule Take 1 capsule (20 mg total) by mouth daily. 90 capsule 3  . furosemide (LASIX) 40 MG tablet Take 80 mg in AM and 40 mg in PM 120 tablet 6  . glucosamine-chondroitin 500-400 MG tablet Take 1 tablet by mouth 2 (two) times daily.     . Hypromellose (GENTEAL MILD) 0.2 % SOLN Place 1 drop into both eyes daily as needed (for dry eyes).     Marland Kitchen losartan (COZAAR) 100 MG tablet Take 0.5 tablets (50 mg total) by mouth daily.    . Misc Natural Products (NF FORMULAS TESTOSTERONE) CAPS Take 1 capsule by mouth daily.     . Multiple Vitamin (MULTIVITAMIN) tablet Take 1 tablet by mouth daily.    Marland Kitchen NITROSTAT 0.4 MG SL tablet DISSOLVE 1 TABLET UNDER TONGUE AS NEEDED FOR CHEST PAIN,MAY REPEAT IN5 MINUTES FOR 2 DOSES. 25 tablet 3  . omega-3 acid ethyl esters (LOVAZA) 1 G capsule Take 1 g by mouth 2 (two) times daily.    . polyethylene glycol (MIRALAX / GLYCOLAX) packet Take 17 g by mouth daily as needed (constipation).    . potassium chloride SA (K-DUR,KLOR-CON) 20 MEQ tablet TAKE 1 TABLET ONCE DAILY. 30 tablet 11  . tamsulosin (FLOMAX) 0.4 MG CAPS capsule Take 0.4 mg by mouth daily.    . temazepam (RESTORIL) 30 MG capsule TAKE 1 CAPSULE AT BEDTIME AS NEEDED FOR SLEEP. 30 capsule 3   No current facility-administered medications on file prior to visit.    ALLERGIES: Allergies  Allergen Reactions  . Metoprolol Shortness Of Breath and Palpitations    Heart starts racing. Shallow breathing   . Metformin And Related Nausea And Vomiting    FAMILY HISTORY: Family History  Problem Relation Age of Onset  . Colon cancer Father   . Cancer Father   . Coronary artery disease Brother   . Diabetes Brother   . Cancer Brother     throat and abdominal  . Heart disease Brother   . Hyperlipidemia Brother   . Peripheral vascular disease Brother     Varicose Veins  . Diabetes Son   . Hyperlipidemia Son     SOCIAL  HISTORY: Social History   Social History  . Marital Status: Married    Spouse Name: N/A  . Number of Children: N/A  . Years of Education: N/A   Occupational History  . Retired    Social History Main Topics  . 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 Smokeless tobacco: Never Used     Comment: quit atleast 25 yrs ago, per pt  .  Alcohol Use: 1.8 oz/week    3 Shots of liquor per week     Comment: socially  . Drug Use: No  . Sexual Activity: Yes   Other Topics Concern  . Not on file   Social History Narrative   Pt lives in [place] with spouse.   Retired from [employer].  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    REVIEW OF SYSTEMS: Constitutional: No fevers, chills, or sweats, no generalized fatigue, change in appetite Eyes: No visual changes, double vision, eye pain Ear, nose and throat: No hearing loss, ear pain, nasal congestion, sore throat Cardiovascular: No chest pain, palpitations Respiratory:  No shortness of breath at rest or with exertion, wheezes GastrointestinaI: No nausea, vomiting, diarrhea, abdominal pain, fecal incontinence Genitourinary:  No dysuria, urinary retention or frequency Musculoskeletal:  back pain Integumentary: No rash, pruritus, skin lesions Neurological: as above Psychiatric: No depression, insomnia, anxiety Endocrine: No palpitations, fatigue, diaphoresis, mood swings, change in appetite, change in weight, increased thirst Hematologic/Lymphatic:  No anemia, purpura, petechiae. Allergic/Immunologic: no itchy/runny eyes, nasal congestion, recent allergic reactions, rashes  PHYSICAL EXAM: Filed Vitals:   01/12/16 0956  BP: 124/60  Pulse: 77   General: No acute distress.  Patient appears well-groomed.   Head:  Normocephalic/atraumatic Eyes:  Fundoscopic exam unremarkable without vessel changes, exudates, hemorrhages or papilledema. Neck: supple, no paraspinal tenderness, full range of  motion Heart:  Regular rate and rhythm Lungs:  Clear to auscultation bilaterally Back: low bilateral tenderness to palpation Neurological Exam: alert and oriented to person, place, and time. Attention span and concentration intact, recent and remote memory intact, fund of knowledge intact.  Speech fluent and not dysarthric, language intact.  CN II-XII intact. Fundoscopic exam unremarkable without vessel changes, exudates, hemorrhages or papilledema.  Bulk and tone normal, muscle strength 5/5 throughout.  Sensation to light touch intact.  Deep tendon reflexes 1+ throughout.  Finger to nose and heel to shin testing intact.  Gait antalgic due to back pain, Romberg negative.  IMPRESSION: 1.  Acute right cerebellar infarct, cryptogenic but possibly cardioembolic given history of paroxysmal atrial fibrillation.  It appears to be unrelated to his symptoms of gait instability, as his symptoms have been ongoing since the summer. 2.  Right carotid artery stenosis 3.  Paroxysmal atrial fibrillation 4.  Hypertension 5.  Hyperlipidemia 6.  DM type 2  PLAN: 1.  Continue Eliquis for secondary stroke prevention 2.  On Lipitor 80mg  and Zetia.  Recommend PCP to see if can get LDL less than 70 (this should be the goal) 3.  Monitoring of carotid artery stenosis by vascular surgery 4.  Continue blood pressure and glycemic control 5.  Mediterranean diet and exercise 6.  Follow up as needed.  15 minutes spent face to face with patient, over 50% spent discussing management.  Metta Clines, DO  CC: [name], MD

## 2016-01-13 ENCOUNTER — Ambulatory Visit (INDEPENDENT_AMBULATORY_CARE_PROVIDER_SITE_OTHER): Payer: Medicare Other | Admitting: Internal Medicine

## 2016-01-13 ENCOUNTER — Other Ambulatory Visit (INDEPENDENT_AMBULATORY_CARE_PROVIDER_SITE_OTHER): Payer: Medicare Other

## 2016-01-13 ENCOUNTER — Encounter: Payer: Self-pay | Admitting: Internal Medicine

## 2016-01-13 VITALS — BP 106/50 | HR 73 | Temp 98.2°F | Resp 16 | Ht 66.0 in | Wt 180.0 lb

## 2016-01-13 DIAGNOSIS — D510 Vitamin B12 deficiency anemia due to intrinsic factor deficiency: Secondary | ICD-10-CM

## 2016-01-13 DIAGNOSIS — E038 Other specified hypothyroidism: Secondary | ICD-10-CM | POA: Diagnosis not present

## 2016-01-13 DIAGNOSIS — N183 Chronic kidney disease, stage 3 unspecified: Secondary | ICD-10-CM

## 2016-01-13 DIAGNOSIS — I10 Essential (primary) hypertension: Secondary | ICD-10-CM | POA: Diagnosis not present

## 2016-01-13 LAB — CBC WITH DIFFERENTIAL/PLATELET
Basophils Absolute: 0 10*3/uL (ref 0.0–0.1)
Basophils Relative: 0.4 % (ref 0.0–3.0)
EOS PCT: 3.1 % (ref 0.0–5.0)
Eosinophils Absolute: 0.2 10*3/uL (ref 0.0–0.7)
HEMATOCRIT: 33.9 % — AB (ref 39.0–52.0)
HEMOGLOBIN: 11.1 g/dL — AB (ref 13.0–17.0)
LYMPHS ABS: 1.3 10*3/uL (ref 0.7–4.0)
LYMPHS PCT: 20 % (ref 12.0–46.0)
MCHC: 32.8 g/dL (ref 30.0–36.0)
MCV: 91.6 fl (ref 78.0–100.0)
MONOS PCT: 10.3 % (ref 3.0–12.0)
Monocytes Absolute: 0.7 10*3/uL (ref 0.1–1.0)
Neutro Abs: 4.2 10*3/uL (ref 1.4–7.7)
Neutrophils Relative %: 66.2 % (ref 43.0–77.0)
Platelets: 193 10*3/uL (ref 150.0–400.0)
RBC: 3.7 Mil/uL — AB (ref 4.22–5.81)
RDW: 15.8 % — ABNORMAL HIGH (ref 11.5–15.5)
WBC: 6.4 10*3/uL (ref 4.0–10.5)

## 2016-01-13 LAB — URINALYSIS, ROUTINE W REFLEX MICROSCOPIC
Bilirubin Urine: NEGATIVE
HGB URINE DIPSTICK: NEGATIVE
KETONES UR: NEGATIVE
Leukocytes, UA: NEGATIVE
NITRITE: NEGATIVE
RBC / HPF: NONE SEEN (ref 0–?)
SPECIFIC GRAVITY, URINE: 1.01 (ref 1.000–1.030)
TOTAL PROTEIN, URINE-UPE24: NEGATIVE
URINE GLUCOSE: NEGATIVE
Urobilinogen, UA: 0.2 (ref 0.0–1.0)
pH: 6 (ref 5.0–8.0)

## 2016-01-13 LAB — BASIC METABOLIC PANEL
BUN: 48 mg/dL — AB (ref 6–23)
CALCIUM: 9 mg/dL (ref 8.4–10.5)
CHLORIDE: 102 meq/L (ref 96–112)
CO2: 32 meq/L (ref 19–32)
CREATININE: 2.15 mg/dL — AB (ref 0.40–1.50)
GFR: 31.18 mL/min — ABNORMAL LOW (ref >60.00)
GLUCOSE: 108 mg/dL — AB (ref 70–99)
Potassium: 4.5 mEq/L (ref 3.5–5.1)
Sodium: 141 mEq/L (ref 135–145)

## 2016-01-13 LAB — IBC PANEL
Iron: 107 ug/dL (ref 42–165)
Saturation Ratios: 27.4 % (ref 20.0–50.0)
Transferrin: 279 mg/dL (ref 212.0–360.0)

## 2016-01-13 LAB — FOLATE

## 2016-01-13 LAB — TSH: TSH: 5.39 u[IU]/mL — ABNORMAL HIGH (ref 0.35–4.50)

## 2016-01-13 LAB — VITAMIN B12: Vitamin B-12: 1053 pg/mL — ABNORMAL HIGH (ref 211–911)

## 2016-01-13 LAB — FERRITIN: FERRITIN: 35 ng/mL (ref 22.0–322.0)

## 2016-01-13 NOTE — Patient Instructions (Signed)
Anemia, Nonspecific Anemia is a condition in which the concentration of red blood cells or hemoglobin in the blood is below normal. Hemoglobin is a substance in red blood cells that carries oxygen to the tissues of the body. Anemia results in not enough oxygen reaching these tissues.  CAUSES  Common causes of anemia include:   Excessive bleeding. Bleeding may be internal or external. This includes excessive bleeding from periods (in women) or from the intestine.   Poor nutrition.   Chronic kidney, thyroid, and liver disease.  Bone marrow disorders that decrease red blood cell production.  Cancer and treatments for cancer.  HIV, AIDS, and their treatments.  Spleen problems that increase red blood cell destruction.  Blood disorders.  Excess destruction of red blood cells due to infection, medicines, and autoimmune disorders. SIGNS AND SYMPTOMS   Minor weakness.   Dizziness.   Headache.  Palpitations.   Shortness of breath, especially with exercise.   Paleness.  Cold sensitivity.  Indigestion.  Nausea.  Difficulty sleeping.  Difficulty concentrating. Symptoms may occur suddenly or they may develop slowly.  DIAGNOSIS  Additional blood tests are often needed. These help your health care provider determine the best treatment. Your health care provider will check your stool for blood and look for other causes of blood loss.  TREATMENT  Treatment varies depending on the cause of the anemia. Treatment can include:   Supplements of iron, vitamin B12, or folic acid.   Hormone medicines.   A blood transfusion. This may be needed if blood loss is severe.   Hospitalization. This may be needed if there is significant continual blood loss.   Dietary changes.  Spleen removal. HOME CARE INSTRUCTIONS Keep all follow-up appointments. It often takes many weeks to correct anemia, and having your health care provider check on your condition and your response to  treatment is very important. SEEK IMMEDIATE MEDICAL CARE IF:   You develop extreme weakness, shortness of breath, or chest pain.   You become dizzy or have trouble concentrating.  You develop heavy vaginal bleeding.   You develop a rash.   You have bloody or black, tarry stools.   You faint.   You vomit up blood.   You vomit repeatedly.   You have abdominal pain.  You have a fever or persistent symptoms for more than 2-3 days.   You have a fever and your symptoms suddenly get worse.   You are dehydrated.  MAKE SURE YOU:  Understand these instructions.  Will watch your condition.  Will get help right away if you are not doing well or get worse.   This information is not intended to replace advice given to you by your health care provider. Make sure you discuss any questions you have with your health care provider.   Document Released: 01/19/2005 Document Revised: 08/14/2013 Document Reviewed: 06/07/2013 Elsevier Interactive Patient Education 2016 Elsevier Inc.  

## 2016-01-13 NOTE — Progress Notes (Signed)
Subjective:  Patient ID: Andrew [name], male    DOB: Jun 19, 1928  Age: 82 y.o. MRN: [id number]  CC: Anemia and Hypertension   HPI Andrew [name] presents for a follow-up. He complains of chronic low back pain but offers no other symptoms today.  Outpatient Prescriptions Prior to Visit  Medication Sig Dispense Refill  . acetaminophen (TYLENOL) 500 MG tablet Take 1,000 mg by mouth every 8 (eight) hours as needed for moderate pain.     Marland Kitchen ALPRAZolam (XANAX) 0.5 MG tablet TAKE 1/2 TABLET TWICE DAILY AS NEEDED FOR ANXIETY. 60 tablet 3  . amiodarone (PACERONE) 200 MG tablet TAKE 1 TABLET TWICE DAILY. 60 tablet 0  . aspirin EC 81 MG tablet Take 1 tablet (81 mg total) by mouth daily.    Marland Kitchen atorvastatin (LIPITOR) 80 MG tablet Take 1 tablet (80 mg total) by mouth daily. 30 tablet 6  . carvedilol (COREG) 6.25 MG tablet Take 1 tablet (6.25 mg total) by mouth 2 (two) times daily. 60 tablet 3  . cilostazol (PLETAL) 100 MG tablet Take 100 mg by mouth 2 (two) times daily.    . Coenzyme Q10 (CO Q 10 PO) Take 1 tablet by mouth daily.    Marland Kitchen ELIQUIS 2.5 MG TABS tablet TAKE 1 TABLET TWICE DAILY. 60 tablet 3  . erythromycin with ethanol (THERAMYCIN) 2 % external solution Apply 1 application topically daily as needed (for skin irritation around neck).    . ezetimibe (ZETIA) 10 MG tablet Take 10 mg by mouth daily.    Marland Kitchen FLUoxetine (PROZAC) 20 MG capsule Take 1 capsule (20 mg total) by mouth daily. 90 capsule 3  . furosemide (LASIX) 40 MG tablet Take 80 mg in AM and 40 mg in PM 120 tablet 6  . glucosamine-chondroitin 500-400 MG tablet Take 1 tablet by mouth 2 (two) times daily.     . Hypromellose (GENTEAL MILD) 0.2 % SOLN Place 1 drop into both eyes daily as needed (for dry eyes).     Marland Kitchen losartan (COZAAR) 100 MG tablet Take 0.5 tablets (50 mg total) by mouth daily.    . Misc Natural Products (NF FORMULAS TESTOSTERONE) CAPS Take 1 capsule by mouth daily.     . Multiple Vitamin (MULTIVITAMIN) tablet Take 1 tablet  by mouth daily.    Marland Kitchen NITROSTAT 0.4 MG SL tablet DISSOLVE 1 TABLET UNDER TONGUE AS NEEDED FOR CHEST PAIN,MAY REPEAT IN5 MINUTES FOR 2 DOSES. 25 tablet 3  . omega-3 acid ethyl esters (LOVAZA) 1 G capsule Take 1 g by mouth 2 (two) times daily.    . polyethylene glycol (MIRALAX / GLYCOLAX) packet Take 17 g by mouth daily as needed (constipation).    . potassium chloride SA (K-DUR,KLOR-CON) 20 MEQ tablet TAKE 1 TABLET ONCE DAILY. 30 tablet 11  . tamsulosin (FLOMAX) 0.4 MG CAPS capsule Take 0.4 mg by mouth daily.    . temazepam (RESTORIL) 30 MG capsule TAKE 1 CAPSULE AT BEDTIME AS NEEDED FOR SLEEP. 30 capsule 3   No facility-administered medications prior to visit.    ROS Review of Systems  Constitutional: Negative.  Negative for chills, fatigue and unexpected weight change.  HENT: Negative.   Eyes: Negative.   Respiratory: Negative.  Negative for cough, choking, chest tightness, shortness of breath and stridor.   Cardiovascular: Negative.  Negative for chest pain, palpitations and leg swelling.  Gastrointestinal: Negative.  Negative for nausea, vomiting, abdominal pain, diarrhea and blood in stool.  Endocrine: Negative.   Genitourinary: Negative.  Negative  for dysuria, frequency, hematuria, flank pain and difficulty urinating.  Musculoskeletal: Positive for back pain. Negative for myalgias and arthralgias.  Skin: Negative.  Negative for color change, pallor, rash and wound.  Allergic/Immunologic: Negative.   Neurological: Negative.  Negative for dizziness, tremors, weakness, light-headedness, numbness and headaches.  Hematological: Negative.  Negative for adenopathy. Does not bruise/bleed easily.  Psychiatric/Behavioral: Negative.     Objective:  BP 106/50 mmHg  Pulse 73  Temp(Src) 98.2 F (36.8 C) (Oral)  Resp 16  Ht 5\' 6"  (1.676 m)  Wt 180 lb (81.647 kg)  BMI 29.07 kg/m2  SpO2 95%  BP Readings from Last 3 Encounters:  01/13/16 106/50  01/12/16 124/60  01/01/16 116/54    Wt  Readings from Last 3 Encounters:  01/13/16 180 lb (81.647 kg)  01/12/16 182 lb 4.8 oz (82.691 kg)  01/01/16 181 lb (82.101 kg)    Physical Exam  Constitutional: He is oriented to person, place, and time. He appears well-developed and well-nourished. No distress.  HENT:  Head: Normocephalic and atraumatic.  Mouth/Throat: Oropharynx is clear and moist. No oropharyngeal exudate.  Eyes: Conjunctivae are normal. Right eye exhibits no discharge. Left eye exhibits no discharge. No scleral icterus.  Neck: Normal range of motion. Neck supple. No JVD present. No tracheal deviation present. No thyromegaly present.  Cardiovascular: Normal rate, regular rhythm, normal heart sounds and intact distal pulses.  Exam reveals no gallop and no friction rub.   No murmur heard. Pulmonary/Chest: Effort normal and breath sounds normal. No stridor. No respiratory distress. He has no wheezes. He has no rales. He exhibits no tenderness.  Abdominal: Soft. Bowel sounds are normal. He exhibits no distension and no mass. There is no tenderness. There is no rebound and no guarding.  Musculoskeletal: Normal range of motion. He exhibits no edema or tenderness.  Lymphadenopathy:    He has no cervical adenopathy.  Neurological: He is oriented to person, place, and time.  Skin: Skin is warm and dry. No rash noted. He is not diaphoretic. No erythema. No pallor.  Vitals reviewed.   Lab Results  Component Value Date   WBC 6.4 01/13/2016   HGB 11.1* 01/13/2016   HCT 33.9* 01/13/2016   PLT 193.0 01/13/2016   GLUCOSE 108* 01/13/2016   CHOL 126 10/17/2015   TRIG 33 10/17/2015   HDL 45 10/17/2015   LDLDIRECT 78.6 03/18/2010   LDLCALC 74 10/17/2015   ALT 43 01/01/2016   AST 42* 01/01/2016   NA 141 01/13/2016   K 4.5 01/13/2016   CL 102 01/13/2016   CREATININE 2.15* 01/13/2016   BUN 48* 01/13/2016   CO2 32 01/13/2016   TSH 5.39* 01/13/2016   PSA 2.79 01/23/2013   INR 1.18 09/17/2015   HGBA1C 6.8* 12/04/2015    MICROALBUR 13.2* 03/23/2015    No results found.  Assessment & Plan:   Aldie was seen today for anemia and hypertension.  Diagnoses and all orders for this visit:  Essential hypertension- his blood pressure is well-controlled -     CBC with Differential/Platelet; Future -     Basic metabolic panel; Future  Other specified hypothyroidism- his TSH is stable in the 5-6 range, I do not think he would benefit from starting thyroid replacement therapy. -     TSH; Future  CKD (chronic kidney disease), stage III- his renal function is stable, he will continue to avoid nephrotoxic agents and will control his blood pressure. -     CBC with Differential/Platelet; Future -  Basic metabolic panel; Future -     Urinalysis, Routine w reflex microscopic (not at [facility]); Future  Pernicious anemia- improvement noted, his vitamin levels are normal, will continue to follow. -     IBC panel; Future -     Folate; Future -     Ferritin; Future -     Vitamin B12; Future  I am having Mr. Pacha maintain his glucosamine-chondroitin, omega-3 acid ethyl esters, NF FORMULAS TESTOSTERONE, multivitamin, aspirin EC, Hypromellose, erythromycin with ethanol, FLUoxetine, acetaminophen, Coenzyme Q10 (CO Q 10 PO), NITROSTAT, polyethylene glycol, cilostazol, tamsulosin, ezetimibe, temazepam, potassium chloride SA, ALPRAZolam, atorvastatin, furosemide, losartan, carvedilol, ELIQUIS, and amiodarone.  No orders of the defined types were placed in this encounter.     Follow-up: Return in about 3 months (around 04/12/2016).  [name], MD

## 2016-01-13 NOTE — Progress Notes (Signed)
Pre visit review using our clinic review tool, if applicable. No additional management support is needed unless otherwise documented below in the visit note. 

## 2016-01-14 ENCOUNTER — Encounter: Payer: Self-pay | Admitting: Internal Medicine

## 2016-02-03 ENCOUNTER — Encounter: Payer: Self-pay | Admitting: Internal Medicine

## 2016-02-03 ENCOUNTER — Ambulatory Visit (INDEPENDENT_AMBULATORY_CARE_PROVIDER_SITE_OTHER): Payer: Medicare Other | Admitting: Internal Medicine

## 2016-02-03 VITALS — BP 110/60 | HR 64 | Temp 97.8°F | Resp 16 | Ht 66.0 in | Wt 181.0 lb

## 2016-02-03 DIAGNOSIS — S61402D Unspecified open wound of left hand, subsequent encounter: Secondary | ICD-10-CM

## 2016-02-03 NOTE — Progress Notes (Signed)
Pre visit review using our clinic review tool, if applicable. No additional management support is needed unless otherwise documented below in the visit note. 

## 2016-02-04 NOTE — Progress Notes (Signed)
Subjective:  Patient ID: Andrew [name], male    DOB: 1928/02/23  Age: 82 y.o. MRN: [id number]  CC: Wound Check   HPI Crowley Krasner Cdebaca presents for a wound check on his left hand. About 18 days ago he was in [place] when he fell on escalator and injured his left hand. He ended up getting 4 stitches in his left hand that need to be removed. He feels like he has recovered from the injury. There is no pain or swelling in the left hand.  Outpatient Prescriptions Prior to Visit  Medication Sig Dispense Refill  . acetaminophen (TYLENOL) 500 MG tablet Take 1,000 mg by mouth every 8 (eight) hours as needed for moderate pain.     Marland Kitchen ALPRAZolam (XANAX) 0.5 MG tablet TAKE 1/2 TABLET TWICE DAILY AS NEEDED FOR ANXIETY. 60 tablet 3  . amiodarone (PACERONE) 200 MG tablet TAKE 1 TABLET TWICE DAILY. 60 tablet 0  . aspirin EC 81 MG tablet Take 1 tablet (81 mg total) by mouth daily.    Marland Kitchen atorvastatin (LIPITOR) 80 MG tablet Take 1 tablet (80 mg total) by mouth daily. 30 tablet 6  . carvedilol (COREG) 6.25 MG tablet Take 1 tablet (6.25 mg total) by mouth 2 (two) times daily. 60 tablet 3  . cilostazol (PLETAL) 100 MG tablet Take 100 mg by mouth 2 (two) times daily.    . Coenzyme Q10 (CO Q 10 PO) Take 1 tablet by mouth daily.    Marland Kitchen ELIQUIS 2.5 MG TABS tablet TAKE 1 TABLET TWICE DAILY. 60 tablet 3  . erythromycin with ethanol (THERAMYCIN) 2 % external solution Apply 1 application topically daily as needed (for skin irritation around neck).    . ezetimibe (ZETIA) 10 MG tablet Take 10 mg by mouth daily.    Marland Kitchen FLUoxetine (PROZAC) 20 MG capsule Take 1 capsule (20 mg total) by mouth daily. 90 capsule 3  . furosemide (LASIX) 40 MG tablet Take 80 mg in AM and 40 mg in PM 120 tablet 6  . glucosamine-chondroitin 500-400 MG tablet Take 1 tablet by mouth 2 (two) times daily.     . Hypromellose (GENTEAL MILD) 0.2 % SOLN Place 1 drop into both eyes daily as needed (for dry eyes).     Marland Kitchen losartan (COZAAR) 100 MG tablet  Take 0.5 tablets (50 mg total) by mouth daily.    . Misc Natural Products (NF FORMULAS TESTOSTERONE) CAPS Take 1 capsule by mouth daily.     . Multiple Vitamin (MULTIVITAMIN) tablet Take 1 tablet by mouth daily.    Marland Kitchen NITROSTAT 0.4 MG SL tablet DISSOLVE 1 TABLET UNDER TONGUE AS NEEDED FOR CHEST PAIN,MAY REPEAT IN5 MINUTES FOR 2 DOSES. 25 tablet 3  . omega-3 acid ethyl esters (LOVAZA) 1 G capsule Take 1 g by mouth 2 (two) times daily.    . polyethylene glycol (MIRALAX / GLYCOLAX) packet Take 17 g by mouth daily as needed (constipation).    . potassium chloride SA (K-DUR,KLOR-CON) 20 MEQ tablet TAKE 1 TABLET ONCE DAILY. 30 tablet 11  . tamsulosin (FLOMAX) 0.4 MG CAPS capsule Take 0.4 mg by mouth daily.    . temazepam (RESTORIL) 30 MG capsule TAKE 1 CAPSULE AT BEDTIME AS NEEDED FOR SLEEP. 30 capsule 3   No facility-administered medications prior to visit.    ROS Review of Systems  All other systems reviewed and are negative.   Objective:  BP 110/60 mmHg  Pulse 64  Temp(Src) 97.8 F (36.6 C) (Oral)  Resp 16  Ht 5\' 6"  (1.676 m)  Wt 181 lb (82.101 kg)  BMI 29.23 kg/m2  SpO2 94%  BP Readings from Last 3 Encounters:  02/03/16 110/60  01/13/16 106/50  01/12/16 124/60    Wt Readings from Last 3 Encounters:  02/03/16 181 lb (82.101 kg)  01/13/16 180 lb (81.647 kg)  01/12/16 182 lb 4.8 oz (82.691 kg)    Physical Exam  Musculoskeletal:       Hands:   Lab Results  Component Value Date   WBC 6.4 01/13/2016   HGB 11.1* 01/13/2016   HCT 33.9* 01/13/2016   PLT 193.0 01/13/2016   GLUCOSE 108* 01/13/2016   CHOL 126 10/17/2015   TRIG 33 10/17/2015   HDL 45 10/17/2015   LDLDIRECT 78.6 03/18/2010   LDLCALC 74 10/17/2015   ALT 43 01/01/2016   AST 42* 01/01/2016   NA 141 01/13/2016   K 4.5 01/13/2016   CL 102 01/13/2016   CREATININE 2.15* 01/13/2016   BUN 48* 01/13/2016   CO2 32 01/13/2016   TSH 5.39* 01/13/2016   PSA 2.79 01/23/2013   INR 1.18 09/17/2015   HGBA1C 6.8*  12/04/2015   MICROALBUR 13.2* 03/23/2015    No results found.  Assessment & Plan:   Dream was seen today for wound check.  Diagnoses and all orders for this visit:  Open wound of hand with complication, left, subsequent encounter- sutures removed, wound is healing well, he was given patient education material regarding wound care he will let me know if he develops any new symptoms.   I am having Mr. Mancilla maintain his glucosamine-chondroitin, omega-3 acid ethyl esters, NF FORMULAS TESTOSTERONE, multivitamin, aspirin EC, Hypromellose, erythromycin with ethanol, FLUoxetine, acetaminophen, Coenzyme Q10 (CO Q 10 PO), NITROSTAT, polyethylene glycol, cilostazol, tamsulosin, ezetimibe, temazepam, potassium chloride SA, ALPRAZolam, atorvastatin, furosemide, losartan, carvedilol, ELIQUIS, and amiodarone.  No orders of the defined types were placed in this encounter.     Follow-up: Return if symptoms worsen or fail to improve.  [name], MD

## 2016-02-04 NOTE — Patient Instructions (Signed)
Wound Care °Taking care of your wound properly can help to prevent pain and infection. It can also help your wound to heal more quickly.  °HOW TO CARE FOR YOUR WOUND  °· Take or apply over-the-counter and prescription medicines only as told by your health care provider. °· If you were prescribed antibiotic medicine, take or apply it as told by your health care provider. Do not stop using the antibiotic even if your condition improves. °· Clean the wound each day or as told by your health care provider. °¨ Wash the wound with mild soap and water. °¨ Rinse the wound with water to remove all soap. °¨ Pat the wound dry with a clean towel. Do not rub it. °· There are many different ways to close and cover a wound. For example, a wound can be covered with stitches (sutures), skin glue, or adhesive strips. Follow instructions from your health care provider about: °¨ How to take care of your wound. °¨ When and how you should change your bandage (dressing). °¨ When you should remove your dressing. °¨ Removing whatever was used to close your wound. °· Check your wound every day for signs of infection. Watch for: °¨ Redness, swelling, or pain. °¨ Fluid, blood, or pus. °· Keep the dressing dry until your health care provider says it can be removed. Do not take baths, swim, use a hot tub, or do anything that would put your wound underwater until your health care provider approves. °· Raise (elevate) the injured area above the level of your heart while you are sitting or lying down. °· Do not scratch or pick at the wound. °· Keep all follow-up visits as told by your health care provider. This is important. °SEEK MEDICAL CARE IF: °· You received a tetanus shot and you have swelling, severe pain, redness, or bleeding at the injection site. °· You have a fever. °· Your pain is not controlled with medicine. °· You have increased redness, swelling, or pain at the site of your wound. °· You have fluid, blood, or pus coming from your  wound. °· You notice a bad smell coming from your wound or your dressing. °SEEK IMMEDIATE MEDICAL CARE IF: °· You have a red streak going away from your wound. °  °This information is not intended to replace advice given to you by your health care provider. Make sure you discuss any questions you have with your health care provider. °  °Document Released: 09/20/2008 Document Revised: 04/28/2015 Document Reviewed: 12/08/2014 °Elsevier Interactive Patient Education ©2016 [employer]. ° °

## 2016-02-09 ENCOUNTER — Telehealth: Payer: Self-pay

## 2016-02-09 DIAGNOSIS — Z006 Encounter for examination for normal comparison and control in clinical research program: Secondary | ICD-10-CM

## 2016-02-09 NOTE — Telephone Encounter (Signed)
Prior auth for Eliquis 2.5mg  sent to [facility].

## 2016-02-09 NOTE — Progress Notes (Signed)
Patient ID: Andrew [name], male   DOB: October 23, 1928, 82 y.o.   MRN: [id number]

## 2016-02-09 NOTE — Progress Notes (Signed)
Patient present for screening visit to participate in [place]. Discussed study and patient signed informed consent before any research activity began. Vital signs, study related questions and labs followed.    Patient pleasant and states "I have had food poison related to a chicken I purchased at [employer]. I vomited for two days, but today I feel much better. I have not drank any water, so I know I am dry." Patient's blood is 92/47, 88/45 and 81/40. Pt states he feels ok, and request water. Pt consumed 2 cups of water and blood pressure rechecked 97/50. Otherwise vital signs are normal. Dr. Marigene Ehlers made aware and verbal order given to hold evening blood pressure medication today and hold lasix in the morning. Pt to be seen in the CHF clinic this week. Pt aware and verbalizes understanding to instructions. CHF nurse Nira Conn will schedule patient in clinic. Pt verbalized he will seek medical attention if he develops any hypotensive symptoms. Pt ambulated to check-out area without problem; his wife is here to drive him.

## 2016-02-10 ENCOUNTER — Telehealth: Payer: Self-pay

## 2016-02-10 NOTE — Telephone Encounter (Signed)
Eliquis approved through 12/25/2016 [id number].

## 2016-02-11 ENCOUNTER — Ambulatory Visit (HOSPITAL_COMMUNITY)
Admission: RE | Admit: 2016-02-11 | Discharge: 2016-02-11 | Disposition: A | Discharge status: Home / Self Care | Payer: Medicare Other | Source: Ambulatory Visit | Attending: Internal Medicine | Admitting: Internal Medicine

## 2016-02-11 ENCOUNTER — Telehealth (HOSPITAL_COMMUNITY): Payer: Self-pay | Admitting: Cardiology

## 2016-02-11 VITALS — BP 114/42 | HR 66 | Wt 178.2 lb

## 2016-02-11 DIAGNOSIS — I4892 Unspecified atrial flutter: Secondary | ICD-10-CM | POA: Insufficient documentation

## 2016-02-11 DIAGNOSIS — I2510 Atherosclerotic heart disease of native coronary artery without angina pectoris: Secondary | ICD-10-CM | POA: Diagnosis not present

## 2016-02-11 DIAGNOSIS — Z7902 Long term (current) use of antithrombotics/antiplatelets: Secondary | ICD-10-CM | POA: Insufficient documentation

## 2016-02-11 DIAGNOSIS — E1122 Type 2 diabetes mellitus with diabetic chronic kidney disease: Secondary | ICD-10-CM | POA: Diagnosis not present

## 2016-02-11 DIAGNOSIS — I69398 Other sequelae of cerebral infarction: Secondary | ICD-10-CM | POA: Insufficient documentation

## 2016-02-11 DIAGNOSIS — K219 Gastro-esophageal reflux disease without esophagitis: Secondary | ICD-10-CM | POA: Diagnosis not present

## 2016-02-11 DIAGNOSIS — R2689 Other abnormalities of gait and mobility: Secondary | ICD-10-CM | POA: Insufficient documentation

## 2016-02-11 DIAGNOSIS — Z8249 Family history of ischemic heart disease and other diseases of the circulatory system: Secondary | ICD-10-CM | POA: Diagnosis not present

## 2016-02-11 DIAGNOSIS — N183 Chronic kidney disease, stage 3 unspecified: Secondary | ICD-10-CM

## 2016-02-11 DIAGNOSIS — I5022 Chronic systolic (congestive) heart failure: Secondary | ICD-10-CM | POA: Insufficient documentation

## 2016-02-11 DIAGNOSIS — I739 Peripheral vascular disease, unspecified: Secondary | ICD-10-CM | POA: Diagnosis not present

## 2016-02-11 DIAGNOSIS — Z79899 Other long term (current) drug therapy: Secondary | ICD-10-CM | POA: Diagnosis not present

## 2016-02-11 DIAGNOSIS — I255 Ischemic cardiomyopathy: Secondary | ICD-10-CM

## 2016-02-11 DIAGNOSIS — I6523 Occlusion and stenosis of bilateral carotid arteries: Secondary | ICD-10-CM | POA: Insufficient documentation

## 2016-02-11 DIAGNOSIS — Z7982 Long term (current) use of aspirin: Secondary | ICD-10-CM | POA: Diagnosis not present

## 2016-02-11 DIAGNOSIS — E785 Hyperlipidemia, unspecified: Secondary | ICD-10-CM | POA: Insufficient documentation

## 2016-02-11 DIAGNOSIS — Z85820 Personal history of malignant melanoma of skin: Secondary | ICD-10-CM | POA: Insufficient documentation

## 2016-02-11 DIAGNOSIS — I130 Hypertensive heart and chronic kidney disease with heart failure and stage 1 through stage 4 chronic kidney disease, or unspecified chronic kidney disease: Secondary | ICD-10-CM | POA: Diagnosis not present

## 2016-02-11 DIAGNOSIS — Z87891 Personal history of nicotine dependence: Secondary | ICD-10-CM | POA: Insufficient documentation

## 2016-02-11 DIAGNOSIS — J449 Chronic obstructive pulmonary disease, unspecified: Secondary | ICD-10-CM | POA: Diagnosis not present

## 2016-02-11 DIAGNOSIS — I480 Paroxysmal atrial fibrillation: Secondary | ICD-10-CM

## 2016-02-11 DIAGNOSIS — M1990 Unspecified osteoarthritis, unspecified site: Secondary | ICD-10-CM | POA: Insufficient documentation

## 2016-02-11 DIAGNOSIS — Z951 Presence of aortocoronary bypass graft: Secondary | ICD-10-CM | POA: Diagnosis not present

## 2016-02-11 DIAGNOSIS — I4891 Unspecified atrial fibrillation: Secondary | ICD-10-CM | POA: Insufficient documentation

## 2016-02-11 MED ORDER — FUROSEMIDE 40 MG PO TABS: 40.0000 mg | ORAL_TABLET | Freq: Two times a day (BID) | ORAL | Status: DC
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
## 2016-02-11 NOTE — Progress Notes (Signed)
Advanced Heart Failure Medication Review by a Pharmacist  Does the patient  feel that his/her medications are working for him/her?  yes  Has the patient been experiencing any side effects to the medications prescribed?  no  Does the patient measure his/her own blood pressure or blood glucose at home?  yes   Does the patient have any problems obtaining medications due to transportation or finances?   no  Understanding of regimen: good Understanding of indications: good Potential of compliance: good Patient understands to avoid NSAIDs. Patient understands to avoid decongestants.  Issues to address at subsequent visits: None   Pharmacist comments:  Mr. Rierson is a pleasant 82 yo M presenting without a medication list but with great recall of his regimen including dosages. He reports good compliance with his medications but does state that he was taking lasix 80 mg BID instead of 80/40 mg as previously recommended since his bottle still had the old directions. He did not have any specific medication-related questions or concerns for me at this time.   Ruta Hinds. Velva Harman, PharmD, BCPS, CPP Clinical Pharmacist Pager: [phone] Phone: [phone] 02/11/2016 3:06 PM      Time with patient: 10 minutes Preparation and documentation time: 2 minutes Total time: 12 minutes

## 2016-02-11 NOTE — Telephone Encounter (Signed)
Per vo amy clegg,np patient should decrease coreg to 3.125 mg twice a day

## 2016-02-11 NOTE — Patient Instructions (Signed)
DECREASE Lasix to 80 mg (2 tabs) in the AM and 40 mg (1 tab) in the PM  Labs today  Your physician recommends that you schedule a follow-up appointment in: 2 months with Dr. [name]  Do the following things EVERYDAY: 1) Weigh yourself in the morning before breakfast. Write it down and keep it in a log. 2) Take your medicines as prescribed 3) Eat low salt foods-Limit salt (sodium) to 2000 mg per day.  4) Stay as active as you can everyday 5) Limit all fluids for the day to less than 2 liters 6)

## 2016-02-15 MED ORDER — CARVEDILOL 3.125 MG PO TABS: 3.1250 mg | ORAL_TABLET | Freq: Two times a day (BID) | ORAL | Status: DC

## 2016-02-15 NOTE — Telephone Encounter (Signed)
Pt aware and voiced understanding 

## 2016-02-17 ENCOUNTER — Other Ambulatory Visit: Payer: Self-pay | Admitting: Cardiology

## 2016-02-19 ENCOUNTER — Other Ambulatory Visit (HOSPITAL_COMMUNITY): Payer: Self-pay | Admitting: Cardiology

## 2016-02-29 DIAGNOSIS — Z006 Encounter for examination for normal comparison and control in clinical research program: Secondary | ICD-10-CM

## 2016-02-29 NOTE — Progress Notes (Signed)
Subject in today for Randomization into [employer]. Subject states "I feel great." Subject's vital signs are WNL's before and after study drug was administered. Subjected monitored by Study Coordinator for 1 1/2 hours after study medication was administered; per protocol, again, vital signs stable and subject verbalized understanding of study medication regimen. Subjected also signed consent for Future Biomedical Research witnessed by Sandie Ano RN. Next visit scheduled March 15, 2016 at 10am.

## 2016-03-14 ENCOUNTER — Other Ambulatory Visit (HOSPITAL_COMMUNITY): Payer: Self-pay | Admitting: Cardiology

## 2016-03-15 DIAGNOSIS — Z006 Encounter for examination for normal comparison and control in clinical research program: Secondary | ICD-10-CM

## 2016-03-18 NOTE — Progress Notes (Signed)
Subject in to clinic for Visit 3. Doing well, no complaints, verbalizes he has been compliant with Study Drug. Study drug titrated up per Study Protocol. Will return April 4th.

## 2016-03-22 ENCOUNTER — Emergency Department (HOSPITAL_COMMUNITY)
Admission: EM | Admit: 2016-03-22 | Discharge: 2016-03-22 | Disposition: A | Discharge status: Home / Self Care | Payer: Medicare Other | Attending: Emergency Medicine | Admitting: Emergency Medicine

## 2016-03-22 ENCOUNTER — Emergency Department (HOSPITAL_COMMUNITY): Payer: Medicare Other

## 2016-03-22 ENCOUNTER — Encounter (HOSPITAL_COMMUNITY): Payer: Self-pay

## 2016-03-22 DIAGNOSIS — Z9889 Other specified postprocedural states: Secondary | ICD-10-CM | POA: Diagnosis not present

## 2016-03-22 DIAGNOSIS — W01198A Fall on same level from slipping, tripping and stumbling with subsequent striking against other object, initial encounter: Secondary | ICD-10-CM | POA: Diagnosis not present

## 2016-03-22 DIAGNOSIS — S50311A Abrasion of right elbow, initial encounter: Secondary | ICD-10-CM

## 2016-03-22 DIAGNOSIS — I509 Heart failure, unspecified: Secondary | ICD-10-CM | POA: Insufficient documentation

## 2016-03-22 DIAGNOSIS — F329 Major depressive disorder, single episode, unspecified: Secondary | ICD-10-CM | POA: Insufficient documentation

## 2016-03-22 DIAGNOSIS — Z9861 Coronary angioplasty status: Secondary | ICD-10-CM | POA: Diagnosis not present

## 2016-03-22 DIAGNOSIS — Y998 Other external cause status: Secondary | ICD-10-CM | POA: Insufficient documentation

## 2016-03-22 DIAGNOSIS — Z79899 Other long term (current) drug therapy: Secondary | ICD-10-CM | POA: Insufficient documentation

## 2016-03-22 DIAGNOSIS — I2510 Atherosclerotic heart disease of native coronary artery without angina pectoris: Secondary | ICD-10-CM | POA: Diagnosis not present

## 2016-03-22 DIAGNOSIS — I480 Paroxysmal atrial fibrillation: Secondary | ICD-10-CM | POA: Insufficient documentation

## 2016-03-22 DIAGNOSIS — Z8719 Personal history of other diseases of the digestive system: Secondary | ICD-10-CM | POA: Insufficient documentation

## 2016-03-22 DIAGNOSIS — Z85820 Personal history of malignant melanoma of skin: Secondary | ICD-10-CM | POA: Diagnosis not present

## 2016-03-22 DIAGNOSIS — I10 Essential (primary) hypertension: Secondary | ICD-10-CM | POA: Diagnosis not present

## 2016-03-22 DIAGNOSIS — Y92007 Garden or yard of unspecified non-institutional (private) residence as the place of occurrence of the external cause: Secondary | ICD-10-CM | POA: Diagnosis not present

## 2016-03-22 DIAGNOSIS — S0001XA Abrasion of scalp, initial encounter: Secondary | ICD-10-CM | POA: Diagnosis not present

## 2016-03-22 DIAGNOSIS — Z7982 Long term (current) use of aspirin: Secondary | ICD-10-CM | POA: Insufficient documentation

## 2016-03-22 DIAGNOSIS — S0990XA Unspecified injury of head, initial encounter: Secondary | ICD-10-CM | POA: Diagnosis present

## 2016-03-22 DIAGNOSIS — Z862 Personal history of diseases of the blood and blood-forming organs and certain disorders involving the immune mechanism: Secondary | ICD-10-CM | POA: Insufficient documentation

## 2016-03-22 DIAGNOSIS — E785 Hyperlipidemia, unspecified: Secondary | ICD-10-CM | POA: Insufficient documentation

## 2016-03-22 DIAGNOSIS — Y9301 Activity, walking, marching and hiking: Secondary | ICD-10-CM | POA: Diagnosis not present

## 2016-03-22 DIAGNOSIS — Z951 Presence of aortocoronary bypass graft: Secondary | ICD-10-CM | POA: Insufficient documentation

## 2016-03-22 DIAGNOSIS — E119 Type 2 diabetes mellitus without complications: Secondary | ICD-10-CM | POA: Insufficient documentation

## 2016-03-22 DIAGNOSIS — S0091XA Abrasion of unspecified part of head, initial encounter: Secondary | ICD-10-CM

## 2016-03-22 DIAGNOSIS — Z86718 Personal history of other venous thrombosis and embolism: Secondary | ICD-10-CM | POA: Diagnosis not present

## 2016-03-22 DIAGNOSIS — Z8701 Personal history of pneumonia (recurrent): Secondary | ICD-10-CM | POA: Insufficient documentation

## 2016-03-22 DIAGNOSIS — M1990 Unspecified osteoarthritis, unspecified site: Secondary | ICD-10-CM | POA: Insufficient documentation

## 2016-03-22 DIAGNOSIS — S3992XA Unspecified injury of lower back, initial encounter: Secondary | ICD-10-CM | POA: Insufficient documentation

## 2016-03-22 DIAGNOSIS — N281 Cyst of kidney, acquired: Secondary | ICD-10-CM | POA: Diagnosis not present

## 2016-03-22 DIAGNOSIS — Z7901 Long term (current) use of anticoagulants: Secondary | ICD-10-CM | POA: Insufficient documentation

## 2016-03-22 DIAGNOSIS — W19XXXA Unspecified fall, initial encounter: Secondary | ICD-10-CM

## 2016-03-22 DIAGNOSIS — M549 Dorsalgia, unspecified: Secondary | ICD-10-CM

## 2016-03-22 MED ORDER — HYDROCODONE-ACETAMINOPHEN 5-325 MG PO TABS
1.0000 | ORAL_TABLET | Freq: Once | ORAL | Status: AC
  Administered 2016-03-22: 1 via ORAL
  Filled 2016-03-22: qty 1

## 2016-03-22 NOTE — Discharge Instructions (Signed)
You were seen in the emergency room today for evaluation after a fall. Your CT scan and x-rays showed no evidence of acute injury. You may keep the abrasions clean with warm water and soap. Take tylenol as needed for pain. You decided to hold off on a prescription for a stronger painkiller today. Please call your primary care provider to schedule a follow up appointment within one week. Return to the ER for new or worsening symptoms.

## 2016-03-22 NOTE — ED Notes (Signed)
Pt being transported to dialysis at this time. Report to be given to RN when patient gets room assigned.

## 2016-03-22 NOTE — ED Provider Notes (Signed)
CSN: [id number]     Arrival date & time 03/22/16  1842 History   First MD Initiated Contact with Patient 03/22/16 1851     Chief Complaint  Patient presents with  . Fall    HPI   Andrew [name] is an 82 y.o. male with history of stroke (09/2015), CAD, CHF, AAA, afib on eliquis who presents to the ED for evaluation after a fall. Pt states he was working in his garden and was walking on a stone pathway when he lost his balance and fell backwards. He states he hit the back of his head and his right elbow. He reports some soreness in the middle of his back. Denies LOC. Denies headache, dizziness, numbness, tingling, weakness, visual disturbance. Pt states he has had some issues with balance and gait instability over the past year, even before his stroke. Declines pain meds at this time.  Past Medical History  Diagnosis Date  . CAD (coronary artery disease)   . LVF (left ventricular failure) ([place])   . Carotid artery stenosis   . Hyperlipidemia   . Hypertension   . GERD (gastroesophageal reflux disease)   . IBS (irritable bowel syndrome)   . Arthritis   . Diabetes mellitus   . Thyroid disease   . Anemia   . Vitamin D deficiency   . Depression   . Fatty liver 2008  . Cancer ([place])     Melanoma - Back  . AAA (abdominal aortic aneurysm) ([place])   . Pneumonia Jan. 2014  . CHF (congestive heart failure) ([place])   . DVT (deep venous thrombosis) ([place])   . Paroxysmal atrial fibrillation ([facility])   . Typical atrial flutter ([place])   . Shortness of breath dyspnea   . Cyst of kidney, acquired    Past Surgical History  Procedure Laterality Date  . Total knee arthroplasty      bilateral- Pt fell 3 steps  . Cardiac stents  2005  . Coronary artery bypass graft  1992  . Joint replacement      Bilateral knee arthroplasty  . Eye surgery      [place]atar[place]  . Abdominal aortic aneurysm repair  2002  . Cardiac catheterization    . Left heart catheterization with coronary angiogram N/A 10/30/2014    Procedure:  LEFT HEART CATHETERIZATION WITH CORONARY ANGIOGRAM;  Surgeon: Larey Dresser, MD;  Location: [facility] CATH LAB;  Service: Cardiovascular;  Laterality: N/A;  . [place]  June 2016    While in [place]  2016   Family History  Problem Relation Age of Onset  . Colon cancer Father   . Cancer Father   . Coronary artery disease Brother   . Diabetes Brother   . Cancer Brother     throat and abdominal  . Heart disease Brother   . Hyperlipidemia Brother   . Peripheral vascular disease Brother     Varicose Veins  . Diabetes Son   . Hyperlipidemia Son    Social History  Substance Use Topics  . 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 Smokeless tobacco: Never Used     Comment: quit atleast 25 yrs ago, per pt  . Alcohol Use: 1.8 oz/week    3 Shots of liquor per week     Comment: socially    Review of Systems  All other systems reviewed and are negative.     Allergies  Metoprolol and Metformin and related  Home Medications   Prior to Admission medications   Medication Sig Start Date  End Date Taking? Authorizing Provider  acetaminophen (TYLENOL) 500 MG tablet Take 1,000 mg by mouth every 8 (eight) hours as needed for moderate pain.     Historical Provider, MD  ALPRAZolam (XANAX) 0.5 MG tablet TAKE 1/2 TABLET TWICE DAILY AS NEEDED FOR ANXIETY. 11/01/15   Janith Lima, MD  amiodarone (PACERONE) 200 MG tablet TAKE 1 TABLET TWICE DAILY. 02/17/16   Larey Dresser, MD  aspirin EC 81 MG tablet Take 1 tablet (81 mg total) by mouth daily. 05/22/14   Larey Dresser, MD  atorvastatin (LIPITOR) 80 MG tablet Take 1 tablet (80 mg total) by mouth daily. 11/06/15   Larey Dresser, MD  carvedilol (COREG) 3.125 MG tablet Take 1 tablet (3.125 mg total) by mouth 2 (two) times daily. 02/15/16   Amy D Ninfa Meeker, NP  cilostazol (PLETAL) 100 MG tablet Take 100 mg by mouth 2 (two) times daily.    Historical Provider, MD  Coenzyme Q10 (CO Q 10 PO) Take 1 tablet by mouth daily.    Historical Provider, MD  ELIQUIS 2.5 MG TABS tablet  TAKE 1 TABLET TWICE DAILY. 01/04/16   Larey Dresser, MD  erythromycin with ethanol [facility]) 2 % external solution Apply 1 application topically daily as needed (for skin irritation around neck).    Historical Provider, MD  ezetimibe (ZETIA) 10 MG tablet Take 10 mg by mouth daily.    Historical Provider, MD  FLUoxetine (PROZAC) 20 MG capsule Take 1 capsule (20 mg total) by mouth daily. 03/05/15   Janith Lima, MD  furosemide (LASIX) 40 MG tablet Take 1-2 tablets (40-80 mg total) by mouth 2 (two) times daily. Take 80 mg(2 tabs) in the AM and take 40 mg(1 tab) in the PM 02/11/16   Amy D Clegg, NP  glucosamine-chondroitin 500-400 MG tablet Take 1 tablet by mouth 2 (two) times daily.     Historical Provider, MD  Hypromellose (GENTEAL MILD) 0.2 % SOLN Place 1 drop into both eyes daily as needed (for dry eyes).     Historical Provider, MD  losartan (COZAAR) 100 MG tablet Take 0.5 tablets (50 mg total) by mouth daily. 12/08/15   Larey Dresser, MD  Misc Natural Products (NF FORMULAS TESTOSTERONE) CAPS Take 1 capsule by mouth daily.     Historical Provider, MD  Multiple Vitamin (MULTIVITAMIN) tablet Take 1 tablet by mouth daily.    Historical Provider, MD  NITROSTAT 0.4 MG SL tablet DISSOLVE 1 TABLET UNDER TONGUE AS NEEDED FOR CHEST PAIN,MAY REPEAT IN5 MINUTES FOR 2 DOSES. Patient not taking: Reported on 02/11/2016 07/13/15   Janith Lima, MD  omega-3 acid ethyl esters (LOVAZA) 1 G capsule Take 1 g by mouth 2 (two) times daily.    Historical Provider, MD  polyethylene glycol (MIRALAX / GLYCOLAX) packet Take 17 g by mouth daily as needed (constipation).    Historical Provider, MD  potassium chloride SA (K-DUR,KLOR-CON) 20 MEQ tablet TAKE 1 TABLET ONCE DAILY. 09/21/15   Janith Lima, MD  tamsulosin (FLOMAX) 0.4 MG CAPS capsule Take 0.4 mg by mouth daily.    Historical Provider, MD  temazepam (RESTORIL) 30 MG capsule TAKE 1 CAPSULE AT BEDTIME AS NEEDED FOR SLEEP. Patient not taking: Reported on 02/11/2016  09/14/15   Janith Lima, MD   BP 176/83 mmHg  SpO2 95% Physical Exam  Constitutional: He is oriented to person, place, and time.  HENT:  Head:    Right Ear: External ear normal.  Left Ear: External ear  normal.  Nose: Nose normal.  Mouth/Throat: Oropharynx is clear and moist. No oropharyngeal exudate.  Eyes: Conjunctivae and EOM are normal. Pupils are equal, round, and reactive to light.  Neck: Normal range of motion. Neck supple.  Cardiovascular: Normal rate, regular rhythm, normal heart sounds and intact distal pulses.   Pulmonary/Chest: Effort normal and breath sounds normal. No respiratory distress. He has no wheezes. He exhibits no tenderness.  Abdominal: Soft. Bowel sounds are normal. He exhibits no distension. There is no tenderness. There is no rebound and no guarding.  Musculoskeletal: He exhibits no edema.  Right elbow with 2cm posterior abrasion/skin tear. Bleeding well controlled. Mild posterior edema. No bruising.   No c-spine, t-spine, or l-spine tenderness  Neurological: He is alert and oriented to person, place, and time. No cranial nerve deficit.  Skin: Skin is warm and dry.  Psychiatric: He has a normal mood and affect.  Nursing note and vitals reviewed.   ED Course  Procedures (including critical care time) Labs Review Labs Reviewed - No data to display  Imaging Review Dg Thoracic Spine 2 View  03/22/2016  CLINICAL DATA:  Fall with upper back pain. EXAM: THORACIC SPINE 2 VIEWS COMPARISON:  11/12/2015 chest radiographs. FINDINGS: Thoracic vertebral body heights appear preserved, with no fracture, subluxation or suspicious focal osseous lesion. Moderate degenerative changes throughout the thoracic spine. Sternotomy wires appear aligned and intact. Atherosclerotic thoracic aorta. IMPRESSION: No fracture or subluxation detected in the thoracic spine. Moderate degenerative changes in the thoracic spine. Electronically Signed   By: Ilona Sorrel M.D.   On: 03/22/2016  21:29   Dg Elbow Complete Right  03/22/2016  CLINICAL DATA:  Posterior right elbow abrasion from fall. Anticoagulated. EXAM: RIGHT ELBOW - COMPLETE 3+ VIEW COMPARISON:  09/03/2015 right forearm radiographs. FINDINGS: The lateral view is slightly oblique, limiting evaluation for a joint effusion. No definite joint effusion. No fracture or dislocation. No suspicious focal osseous lesion. Small spurs are noted at the medial and lateral epicondyles. Tiny marginal osteophytes are present in the right elbow joint. Small enthesophyte at the right olecranon. Stable nonspecific soft tissue calcification anterior to the right distal humerus. IMPRESSION: 1. No fracture, joint effusion or malalignment, with limitations as described. 2. 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 Electronically Signed   By: Ilona Sorrel M.D.   On: 03/22/2016 20:26   Ct Head Wo Contrast  03/22/2016  CLINICAL DATA:  82 year old male with fall EXAM: CT HEAD WITHOUT CONTRAST TECHNIQUE: Contiguous axial images were obtained from the base of the skull through the vertex without intravenous contrast. COMPARISON:  Brain MRI dated 05/22/2013 FINDINGS: The ventricles are dilated and the sulci are prominent compatible with age-related atrophy. Periventricular and deep white matter hypodensities represent chronic microvascular ischemic changes. There is no intracranial hemorrhage. No mass effect or midline shift identified. The visualized paranasal sinuses and mastoid air cells are well aerated. The calvarium is intact. There is small posterior scalp hematoma. IMPRESSION: No acute intracranial hemorrhage. Age-related atrophy and chronic microvascular ischemic disease. Electronically Signed   By: Anner Crete M.D.   On: 03/22/2016 20:11   I have personally reviewed and evaluated these images and lab results as part of my medical decision-making.   EKG Interpretation None      MDM   Final diagnoses:  Fall, initial  encounter  Abrasion of head, initial encounter  Elbow abrasion, right, initial encounter  Mid back pain    CT and x-ray of elbow negative for acute  abnormality. After initial imaging pt states the pain in his back is getting worse. He continues to deny midline tenderness but would like an x-ray of his back. He states his pain is in his lower thoracic area.   Thoracic spine x-ray negative. Pt with pain relief with norco. Wounds were cleaned and dressed. Instructed to f/u with PCP within one week. Pt declines rx for pain meds at home, states he will take tylenol. Strict ER return precautions given. Pt verbalized his agreement and understanding with the plan.       Anne Ng, PA-C 03/23/16 0024  Carmin Muskrat, MD 03/29/16 [phone]

## 2016-03-22 NOTE — ED Notes (Signed)
Pt arrived via EMS from home c/o fall outside.  Pt fell backwards and hit head, denies LOC.  Abrasion to back of head, right arm and left knee, mid back pain.  Takes Eliquis.  No active bleeding.

## 2016-03-29 ENCOUNTER — Ambulatory Visit (INDEPENDENT_AMBULATORY_CARE_PROVIDER_SITE_OTHER): Payer: Medicare Other | Admitting: Podiatry

## 2016-03-29 ENCOUNTER — Encounter: Payer: Self-pay | Admitting: Podiatry

## 2016-03-29 DIAGNOSIS — M79676 Pain in unspecified toe(s): Secondary | ICD-10-CM

## 2016-03-29 DIAGNOSIS — I739 Peripheral vascular disease, unspecified: Secondary | ICD-10-CM

## 2016-03-29 DIAGNOSIS — B351 Tinea unguium: Secondary | ICD-10-CM | POA: Diagnosis not present

## 2016-03-29 NOTE — Progress Notes (Signed)
Patient ID: ABDIAZIZ KRETZER, male   DOB: 1928/01/08, 82 y.o.   MRN: [id number] Complaint:  Visit Type: Patient returns to my office for continued preventative foot care services. Complaint: Patient states" my nails have grown long and thick and become painful to walk and wear shoes" Patient has been diagnosed with DM with no foot complications. The patient presents for preventative foot care services. No changes to ROS  Podiatric Exam: Vascular: dorsalis pedis and posterior tibial pulses are palpable bilateral. Capillary return is immediate. Temperature gradient is WNL. Skin turgor WNL  Sensorium: Normal Semmes Weinstein monofilament test. Normal tactile sensation bilaterally. Nail Exam: Pt has thick disfigured discolored nails with subungual debris noted bilateral entire nail hallux through fifth toenails Ulcer Exam: There is no evidence of ulcer or pre-ulcerative changes or infection. Orthopedic Exam: Muscle tone and strength are WNL. No limitations in general ROM. No crepitus or effusions noted. Foot type and digits show no abnormalities. Bony prominences are unremarkable. 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 Skin: No Porokeratosis. No infection or ulcers  Diagnosis:  Onychomycosis, , Pain in right toe, pain in left toes  Treatment & Plan Procedures and Treatment: Consent by patient was obtained for treatment procedures. The patient understood the discussion of treatment and procedures well. All questions were answered thoroughly reviewed. Debridement of mycotic and hypertrophic toenails, 1 through 5 bilateral and clearing of subungual debris. No ulceration, no infection noted.  Return Visit-Office Procedure: Patient instructed to return to the office for a follow up visit 3 months for continued evaluation and treatment.   Gardiner Barefoot DPM

## 2016-03-30 ENCOUNTER — Other Ambulatory Visit (HOSPITAL_COMMUNITY): Payer: Self-pay | Admitting: Cardiology

## 2016-03-30 VITALS — BP 104/43 | HR 61 | Temp 98.6°F | Resp 18 | Wt 183.6 lb

## 2016-03-30 DIAGNOSIS — Z006 Encounter for examination for normal comparison and control in clinical research program: Secondary | ICD-10-CM

## 2016-03-31 ENCOUNTER — Telehealth (HOSPITAL_COMMUNITY): Payer: Self-pay | Admitting: *Deleted

## 2016-03-31 NOTE — Telephone Encounter (Signed)
Per Dr [name], [name] w/research stated pt's wt is up and he is having swelling in his legs, he has been non compliant with diet and has been eating [place] food.  Per Dr [name] pt needs to take extra dose of lasix for 3 days.  Called and spoke w/pt, he is aware and agreeable

## 2016-03-31 NOTE — Research (Signed)
1400 Spoke to subject on the phone following up on his weight gain. Up to 183.06 lbs from 174 lbs two weeks ago. Subject states he ate the rest of his asian food yesterday after he left here. Today his legs and feet are swollen. Instructed him writer will speak to Dr. [name] and I or Heart Failure clinic will follow up with him. He should call the Heart Failure Clinic himself if he starts to feel any symptoms of heart failure, SOB etc. He verbalized understanding.   1600 Dr. [name] aware of weight status, blood pressures and fall subject incurred on 3/28. Dr. [name] will have Nira Conn, RN call subject and increase his diuretics.

## 2016-03-31 NOTE — Progress Notes (Signed)
03-30-18 11am Late entry: Subject arrived for Research visit, states he is doing well. Subject stated that he fell, tripped over garden stones on 3/28 and came to the emergency room for assessment. Subject verbalized that all radiograph films were negative, ice and acetaminophen were prescribed for his discomfort. Subject states he is healing well, but continue to have mild discomfort in his right elbow, and posterior scalp. Overall, patient is very delightful. Subject realizes his weight is up as he weighs daily at home. States "I ate asian food last night and I know that is to salty, I will do better." Subject in no distress. All medications including study medication reviewed with subject. Subject  meets criteria to increase study drug dose from 5mg  to 10mg  as systolic blood pressure is >100. Subject very atune to the fact that the study drug may be "real or placebo."  No other questions from subject, next visit June 28th. Verbalizes he will call [employer] with any questions.

## 2016-04-07 ENCOUNTER — Other Ambulatory Visit: Payer: Self-pay | Admitting: Internal Medicine

## 2016-04-14 ENCOUNTER — Ambulatory Visit (HOSPITAL_COMMUNITY)
Admission: RE | Admit: 2016-04-14 | Discharge: 2016-04-14 | Disposition: A | Discharge status: Home / Self Care | Payer: Medicare Other | Source: Ambulatory Visit | Attending: Cardiology | Admitting: Cardiology

## 2016-04-14 ENCOUNTER — Encounter (HOSPITAL_COMMUNITY): Payer: Self-pay

## 2016-04-14 ENCOUNTER — Telehealth (HOSPITAL_COMMUNITY): Payer: Self-pay

## 2016-04-14 VITALS — BP 110/60 | HR 68 | Wt 173.8 lb

## 2016-04-14 DIAGNOSIS — I255 Ischemic cardiomyopathy: Secondary | ICD-10-CM | POA: Diagnosis not present

## 2016-04-14 DIAGNOSIS — E785 Hyperlipidemia, unspecified: Secondary | ICD-10-CM | POA: Diagnosis not present

## 2016-04-14 DIAGNOSIS — Z951 Presence of aortocoronary bypass graft: Secondary | ICD-10-CM | POA: Diagnosis not present

## 2016-04-14 DIAGNOSIS — Z87891 Personal history of nicotine dependence: Secondary | ICD-10-CM | POA: Insufficient documentation

## 2016-04-14 DIAGNOSIS — I6529 Occlusion and stenosis of unspecified carotid artery: Secondary | ICD-10-CM | POA: Diagnosis not present

## 2016-04-14 DIAGNOSIS — I69398 Other sequelae of cerebral infarction: Secondary | ICD-10-CM | POA: Diagnosis not present

## 2016-04-14 DIAGNOSIS — I739 Peripheral vascular disease, unspecified: Secondary | ICD-10-CM | POA: Insufficient documentation

## 2016-04-14 DIAGNOSIS — Z79899 Other long term (current) drug therapy: Secondary | ICD-10-CM | POA: Insufficient documentation

## 2016-04-14 DIAGNOSIS — I130 Hypertensive heart and chronic kidney disease with heart failure and stage 1 through stage 4 chronic kidney disease, or unspecified chronic kidney disease: Secondary | ICD-10-CM | POA: Insufficient documentation

## 2016-04-14 DIAGNOSIS — N183 Chronic kidney disease, stage 3 (moderate): Secondary | ICD-10-CM | POA: Insufficient documentation

## 2016-04-14 DIAGNOSIS — I5022 Chronic systolic (congestive) heart failure: Secondary | ICD-10-CM | POA: Diagnosis not present

## 2016-04-14 DIAGNOSIS — K589 Irritable bowel syndrome without diarrhea: Secondary | ICD-10-CM | POA: Diagnosis not present

## 2016-04-14 DIAGNOSIS — J449 Chronic obstructive pulmonary disease, unspecified: Secondary | ICD-10-CM | POA: Insufficient documentation

## 2016-04-14 DIAGNOSIS — Z85820 Personal history of malignant melanoma of skin: Secondary | ICD-10-CM | POA: Diagnosis not present

## 2016-04-14 DIAGNOSIS — Z7982 Long term (current) use of aspirin: Secondary | ICD-10-CM | POA: Diagnosis not present

## 2016-04-14 DIAGNOSIS — E1122 Type 2 diabetes mellitus with diabetic chronic kidney disease: Secondary | ICD-10-CM | POA: Insufficient documentation

## 2016-04-14 DIAGNOSIS — I252 Old myocardial infarction: Secondary | ICD-10-CM | POA: Insufficient documentation

## 2016-04-14 DIAGNOSIS — I480 Paroxysmal atrial fibrillation: Secondary | ICD-10-CM | POA: Insufficient documentation

## 2016-04-14 DIAGNOSIS — I2510 Atherosclerotic heart disease of native coronary artery without angina pectoris: Secondary | ICD-10-CM | POA: Diagnosis not present

## 2016-04-14 LAB — BASIC METABOLIC PANEL
ANION GAP: 10 (ref 5–15)
BUN: 40 mg/dL — AB (ref 6–20)
CALCIUM: 9.2 mg/dL (ref 8.9–10.3)
CO2: 31 mmol/L (ref 22–32)
Chloride: 104 mmol/L (ref 101–111)
Creatinine, Ser: 1.73 mg/dL — ABNORMAL HIGH (ref 0.61–1.24)
GFR calc Af Amer: 40 mL/min — ABNORMAL LOW (ref >60)
GFR calc non Af Amer: 34 mL/min — ABNORMAL LOW (ref >60)
GLUCOSE: 184 mg/dL — AB (ref 65–99)
POTASSIUM: 3.8 mmol/L (ref 3.5–5.1)
Sodium: 145 mmol/L (ref 135–145)

## 2016-04-14 MED ORDER — AMIODARONE HCL 200 MG PO TABS: 200.0000 mg | ORAL_TABLET | Freq: Every day | ORAL | Status: DC

## 2016-04-14 NOTE — Telephone Encounter (Signed)
Patient called to inform Dr. [name] after today's CHF clinic OV that he had been taking 2 amiodarone 200 mg tablets once daily.  Will now take 1 200 mg tablet onc edaily. Also reports taking 2 40 mg lasix tabs per day. Will forward to provider to make him aware.  Renee Pain

## 2016-04-14 NOTE — Patient Instructions (Signed)
PLEASE CALL us BACK TODAY ABOUT YOUR FUROSEMIDE (LASIX) DOSE  MAKE SURE TO ONLY TAKE AMIODARONE 200 MG DAILY  Labs today  We will contact you in 3 months to schedule your next appointment.

## 2016-04-16 NOTE — Progress Notes (Signed)
Patient ID: Andrew [name], male   DOB: May 24, 1928, 82 y.o.   MRN: [id number] PCP: Dr. Ronnald Ramp Cardiology: Dr. [name]   82 yo with history of CKD, CAD s/p CABG, and ischemic cardiomyopathy with primarily diastolic CHF presents for cardiology followup.  He has a history of PAD followed at [facility].  I took him for [facility] in 11/15. 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  He had had occlusion of a relatively small LAD, there were right to left collaterals and no intervention was done (medical management).    He and his wife went on a cruise along the [employer] in 5/16.  He admits to considerable dietary indiscretion (high sodium diet).  It appears that he developed a hypertensive crisis along with chest pain while on the ship. He went into atrial fibrillation and developed CHF.  He was taken to the hospital in [place], [place].  He was in the ICU for about a week on Bipap intermittently.  Troponin peaked at 0.09 during this admission.  He was diuresed and after about 2 wks left the hospital and was able to fly home.    Cardiolite (6/16) showed EF 42%, prior anterolateral MI, no ischemia. Echo in 10/16 with EF 40-45%.    He was admitted on 07/06/15 for RLQ pain and fever.  He was treated for UTI and had a kidney stone as well.  He developed SOB after IVF were given for AKI.  He was treated for acute COPD exacerbation as well as CHF decompensation.  He was diuresed with IV lasix.  His weight on admission was 179 and got as high as 188 after IVF. Discharge weight was 181.  He was admitted in 10/16 with a cerebellar CVA.  He has some resultant imbalance.   He was again admitted in 11/16 with acute on chronic systolic CHF.  This was a short admission that appeared to be precipitated by a sodium load from eating at a [employer] .   He returns for HF follow up. Weight is down about 5 lbs.  Overall, feels like he is doing well.  Now enrolled in the VICTORIA study.  He gardens and walks his dog, no  dyspnea with mild to moderate activity.  No chest pain.  No orthopnea or PND.  No BRBPR or melena. He is in NSR today.  Still has some imbalance but no fall in the last few weeks.   Labs (12/14): K 4.8, creatinine 1.5 Labs (5/15): LDL 92 Labs (11/15): K 4.1, creatinine 1.5 Labs (12/15): K 4, creatinine 1.5 Labs (1/16): LFTs normal, LDL 51, HDL 47 Labs (5/16): TnI 0.09 Labs (6/16): K 4.8 => 4.4, creatinine 1.79 => 2.26, HCT 28.7 Labs (7/16) K 4.2, creatinine 1.98, LFTs normal, TSH normal Labs (8/16): HCT 27.1 Labs (9/16): hgb 8.1, K 4.3, creatinine 1.8, LFTs normal, TSH elevated Labs (10/16): LDL 74, HDL 45, TSH 5.19 (increased), free T3 and free T4 normal.  Labs (11/16): K 3.5, creatinine 1.47, AST 84, ALT 89 Labs (12/16): K 4.3, creatinine 1.58, AST 43, ALT 42 Labs (3/17): K 4.2, creatinine 1.4, hgb 10.7, AST 36, ALT 45  ECG: NSR, 1st degree AV block, IVCD QRS 136 msec  PMH: 1. CKD 2. HTN 3. Hyperlipidemia 4. AAA: s/p surgery with left renal artery bypass in 1997 5. GERD 6. IBS 7. Melanoma s/p reception 8. Diabetes: Diet-controlled 9. Carotid stenosis: Carotid dopplers (4/15) with 40-59% bilateral stenosis. Carotid dopplers (7/16) with < 40% BICA stenosis.  Carotid dopplers (10/16) with 40-59% BICA stenosis.  10. PAD: 9/14 ABIs 0.91 right 1.09 left.  7/16 ABIs 0.86 right, 1.1 left; aortoiliac duplex with < 50% bilateral iliac stenosis.  11. CAD: Has super-dominant RCA. s/p CABG 1992.  He had Taxus DES to mid PDA in 5/02. LHC (1/05) with 90% ostial PDA (had DES to this vessel), totally occluded RIMA-PDA, totally occluded PLV, sequential SVG-PLV with 1 branch occluded.  LHC (11/15) with left main off the right cusp, 40-50% ostial left main, total occlusion of the proximal LAD (small vessel) with right to left collaterals, large super-dominant RCA with 50% ISR mid RCA, 50% ostial PDA, patent SVG-PLV, RIMA-PDA known atretic. Lexiscan Cardiolite (6/16) with EF 42%, prior anterolateral MI,  no ischemia.  12. Ischemic cardiomyopathy: Echo (12/09) with EF 45-50%, basal to mid inferolateral hypokinesis. Echo (6/15) with EF 45-50%, akinesis of the mid to apical inferolateral wall.  Echo (6/16) with EF 50-55%, mild LVH, inferior hypokinesis, mild MR.  Echo (10/16) with EF 40-45%, moderate LVH, mildly decreased RV systolic function.  13. OA 14. Atrial fibrillation/flutter: Paroxysmal.  Noted during 5/16 hospitalization in [place] and in 6/16.  He developed atrial flutter in 8/16, back in NSR by 9/16.  15. Emphysema: PFTs (8/16) with FVC 88%, FEV1 74%, ratio 81%, TLC 83%, DLCO 31%.  16. Renal cysts 17. Idiopathic peripheral neuropathy.  18. Anemia.  44. 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  FH: Brother with CAD.   ROS: All systems reviewed and negative except as per HPI.   Current Outpatient Prescriptions  Medication Sig Dispense Refill  . acetaminophen (TYLENOL) 500 MG tablet Take 1,000 mg by mouth every 8 (eight) hours as needed for moderate pain.     Marland Kitchen ALPRAZolam (XANAX) 0.5 MG tablet TAKE 1/2 TABLET TWICE DAILY AS NEEDED FOR ANXIETY. 60 tablet 3  . amiodarone (PACERONE) 200 MG tablet Take 1 tablet (200 mg total) by mouth daily. 30 tablet 6  . aspirin EC 81 MG tablet Take 1 tablet (81 mg total) by mouth daily.    Marland Kitchen atorvastatin (LIPITOR) 80 MG tablet Take 1 tablet (80 mg total) by mouth daily. 30 tablet 6  . carvedilol (COREG) 3.125 MG tablet Take 1 tablet (3.125 mg total) by mouth 2 (two) times daily. 60 tablet   . cilostazol (PLETAL) 100 MG tablet Take 100 mg by mouth 2 (two) times daily.    . Coenzyme Q10 (CO Q 10 PO) Take 1 tablet by mouth daily.    Marland Kitchen ELIQUIS 2.5 MG TABS tablet TAKE 1 TABLET TWICE DAILY. 60 tablet 3  . erythromycin with ethanol (THERAMYCIN) 2 % external solution Apply 1 application topically daily as needed (for skin irritation around neck).    . ezetimibe (ZETIA) 10 MG tablet  Take 10 mg by mouth daily.    Marland Kitchen FLUoxetine (PROZAC) 20 MG capsule TAKE (1) CAPSULE DAILY. 90 capsule 1  . furosemide (LASIX) 40 MG tablet Take 40 mg by mouth 2 (two) times daily.    Marland Kitchen glucosamine-chondroitin 500-400 MG tablet Take 1 tablet by mouth 2 (two) times daily.     . Hypromellose (GENTEAL MILD) 0.2 % SOLN Place 1 drop into both eyes daily as needed (for dry eyes).     Marland Kitchen losartan (COZAAR) 100 MG tablet TAKE 1 TABLET ONCE DAILY. 90 tablet 1  . Misc Natural Products (NF FORMULAS TESTOSTERONE) CAPS Take 1 capsule by mouth daily.     Marland Kitchen  Multiple Vitamin (MULTIVITAMIN) tablet Take 1 tablet by mouth daily.    Marland Kitchen NITROSTAT 0.4 MG SL tablet DISSOLVE 1 TABLET UNDER TONGUE AS NEEDED FOR CHEST PAIN,MAY REPEAT IN5 MINUTES FOR 2 DOSES. 25 tablet 3  . omega-3 acid ethyl esters (LOVAZA) 1 G capsule Take 1 g by mouth 2 (two) times daily.    . polyethylene glycol (MIRALAX / GLYCOLAX) packet Take 17 g by mouth daily as needed (constipation).    . potassium chloride SA (K-DUR,KLOR-CON) 20 MEQ tablet TAKE 1 TABLET ONCE DAILY. 30 tablet 11  . tamsulosin (FLOMAX) 0.4 MG CAPS capsule Take 0.4 mg by mouth daily.    . temazepam (RESTORIL) 30 MG capsule TAKE 1 CAPSULE AT BEDTIME AS NEEDED FOR SLEEP. 30 capsule 3   No current facility-administered medications for this encounter.   BP 110/60 mmHg  Pulse 68  Wt 173 lb 12 oz (78.812 kg)  SpO2 96%  General: NAD. Ambulated in the clinic without difficulty Neck: JVP flat . No thyromegaly or thyroid nodule.  Lungs: Clear to auscultation bilaterally with normal respiratory effort. CV: Nondisplaced PMI.  Heart irregular S1/S2, no S3/S4, 2/6 early SEM RUSB. No edema.  Right carotid bruit. Difficult to palpate pedal pulses.  Abdomen: Soft, nontender, non-distended, no hepatosplenomegaly.  Skin: Bilateral LE wounds, covered. Dressing clean, dry, and intact. Neurologic: Alert and oriented x 3.  Psych: Normal affect. Extremities: No clubbing or cyanosis. LUE  sling.  Assessment/Plan:  1. CAD: s/p CABG.  Cath in 11/15 showed occluded LAD with left to right collaterals.  The LAD was a relatively small vessel (super-dominant right).  Managed medically. He had chest pain in the setting of an admission to hospital in [place] with CHF exacerbation.  No chest pain since. Lexiscan Cardiolite in 6/16 with infarction but no ischemia.  - Continue ASA 81.   - Continue statin.  2. Ischemic cardiomyopathy with systolic CHF: EF [id number] on 10/16 echo.  NYHA class II symptoms currently.  Volume status ok.   - Continue current losartan 100 mg daily and Coreg 3.125 mg bid.  - Continue current Lasix, 80 mg bid.   3. Carotid stenosis: Stable, followed at [facility].  4. PAD: Followed at [facility].  Stable ABIs recently.  Continue cilostazol, unable to come off due to severe calf pain when he stops it. He has claudication with moderate exertion chronically.  5. Hyperlipidemia: Good lipids in 10/16.  6. CKD III: BMET today.   7. Atrial fibrillation/flutter:  Paroxysmal, NSR today.  - Continue amiodarone to 200 mg.  Slight LFT elevation in 3/17, continue to follow.  Will need to get regular eye exams.  - Continue Eliquis 2.5 bid.  8. Suspected OSA: He cancelled his sleep study due to mask discomfort 9. COPD: History of smoking. PFTs suggestive of emphysema.  10. CVA: Cerebellar, 10/16.  He has had some resulting imbalance.  He is no longer driving.   Follow up 3 months.   Loralie Champagne 04/16/2016

## 2016-05-01 ENCOUNTER — Other Ambulatory Visit (HOSPITAL_COMMUNITY): Payer: Self-pay | Admitting: Cardiology

## 2016-05-10 ENCOUNTER — Other Ambulatory Visit: Payer: Self-pay | Admitting: Internal Medicine

## 2016-05-11 NOTE — Telephone Encounter (Signed)
Faxed script back to [facility].../lmb 

## 2016-05-18 ENCOUNTER — Other Ambulatory Visit (HOSPITAL_COMMUNITY): Payer: Self-pay | Admitting: Cardiology

## 2016-05-25 ENCOUNTER — Other Ambulatory Visit (INDEPENDENT_AMBULATORY_CARE_PROVIDER_SITE_OTHER): Payer: Medicare Other

## 2016-05-25 ENCOUNTER — Encounter: Payer: Self-pay | Admitting: Internal Medicine

## 2016-05-25 ENCOUNTER — Ambulatory Visit (INDEPENDENT_AMBULATORY_CARE_PROVIDER_SITE_OTHER): Payer: Medicare Other | Admitting: Internal Medicine

## 2016-05-25 ENCOUNTER — Ambulatory Visit (HOSPITAL_COMMUNITY)
Admission: RE | Admit: 2016-05-25 | Discharge: 2016-05-25 | Disposition: A | Discharge status: Home / Self Care | Payer: Medicare Other | Source: Ambulatory Visit | Attending: Internal Medicine | Admitting: Internal Medicine

## 2016-05-25 VITALS — BP 136/62 | HR 64 | Temp 98.2°F | Resp 16 | Ht 66.0 in | Wt 180.0 lb

## 2016-05-25 DIAGNOSIS — R600 Localized edema: Secondary | ICD-10-CM | POA: Diagnosis not present

## 2016-05-25 DIAGNOSIS — E119 Type 2 diabetes mellitus without complications: Secondary | ICD-10-CM | POA: Diagnosis not present

## 2016-05-25 DIAGNOSIS — K219 Gastro-esophageal reflux disease without esophagitis: Secondary | ICD-10-CM | POA: Diagnosis not present

## 2016-05-25 DIAGNOSIS — E785 Hyperlipidemia, unspecified: Secondary | ICD-10-CM | POA: Insufficient documentation

## 2016-05-25 DIAGNOSIS — I5022 Chronic systolic (congestive) heart failure: Secondary | ICD-10-CM

## 2016-05-25 DIAGNOSIS — M79662 Pain in left lower leg: Secondary | ICD-10-CM | POA: Insufficient documentation

## 2016-05-25 DIAGNOSIS — M7989 Other specified soft tissue disorders: Secondary | ICD-10-CM | POA: Diagnosis not present

## 2016-05-25 DIAGNOSIS — I509 Heart failure, unspecified: Secondary | ICD-10-CM | POA: Insufficient documentation

## 2016-05-25 DIAGNOSIS — I10 Essential (primary) hypertension: Secondary | ICD-10-CM | POA: Diagnosis not present

## 2016-05-25 DIAGNOSIS — M79605 Pain in left leg: Secondary | ICD-10-CM | POA: Diagnosis not present

## 2016-05-25 DIAGNOSIS — I110 Hypertensive heart disease with heart failure: Secondary | ICD-10-CM | POA: Diagnosis not present

## 2016-05-25 DIAGNOSIS — M79669 Pain in unspecified lower leg: Secondary | ICD-10-CM | POA: Insufficient documentation

## 2016-05-25 DIAGNOSIS — E038 Other specified hypothyroidism: Secondary | ICD-10-CM

## 2016-05-25 DIAGNOSIS — F329 Major depressive disorder, single episode, unspecified: Secondary | ICD-10-CM | POA: Diagnosis not present

## 2016-05-25 LAB — CBC WITH DIFFERENTIAL/PLATELET
BASOS ABS: 0 10*3/uL (ref 0.0–0.1)
Basophils Relative: 0.3 % (ref 0.0–3.0)
EOS ABS: 0.2 10*3/uL (ref 0.0–0.7)
Eosinophils Relative: 2.7 % (ref 0.0–5.0)
HEMATOCRIT: 32.5 % — AB (ref 39.0–52.0)
Hemoglobin: 10.9 g/dL — ABNORMAL LOW (ref 13.0–17.0)
LYMPHS PCT: 18.7 % (ref 12.0–46.0)
Lymphs Abs: 1 10*3/uL (ref 0.7–4.0)
MCHC: 33.4 g/dL (ref 30.0–36.0)
MCV: 98 fl (ref 78.0–100.0)
Monocytes Absolute: 0.5 10*3/uL (ref 0.1–1.0)
Monocytes Relative: 8.2 % (ref 3.0–12.0)
NEUTROS ABS: 3.9 10*3/uL (ref 1.4–7.7)
NEUTROS PCT: 70.1 % (ref 43.0–77.0)
PLATELETS: 158 10*3/uL (ref 150.0–400.0)
RBC: 3.32 Mil/uL — ABNORMAL LOW (ref 4.22–5.81)
RDW: 14.1 % (ref 11.5–15.5)
WBC: 5.5 10*3/uL (ref 4.0–10.5)

## 2016-05-25 LAB — BRAIN NATRIURETIC PEPTIDE: Pro B Natriuretic peptide (BNP): 389 pg/mL — ABNORMAL HIGH (ref 0.0–100.0)

## 2016-05-25 LAB — BASIC METABOLIC PANEL
BUN: 40 mg/dL — ABNORMAL HIGH (ref 6–23)
CALCIUM: 9.3 mg/dL (ref 8.4–10.5)
CO2: 31 meq/L (ref 19–32)
CREATININE: 1.69 mg/dL — AB (ref 0.40–1.50)
Chloride: 105 mEq/L (ref 96–112)
GFR: 41.13 mL/min — ABNORMAL LOW (ref >60.00)
Glucose, Bld: 145 mg/dL — ABNORMAL HIGH (ref 70–99)
Potassium: 4 mEq/L (ref 3.5–5.1)
Sodium: 142 mEq/L (ref 135–145)

## 2016-05-25 LAB — TSH: TSH: 6.23 u[IU]/mL — AB (ref 0.35–4.50)

## 2016-05-25 NOTE — Patient Instructions (Signed)
Edema °Edema is an abnormal buildup of fluids in your body tissues. Edema is somewhat dependent on gravity to pull the fluid to the lowest place in your body. That makes the condition more common in the legs and thighs (lower extremities). Painless swelling of the feet and ankles is common and becomes more likely as you get older. It is also common in looser tissues, like around your eyes.  °When the affected area is squeezed, the fluid may move out of that spot and leave a dent for a few moments. This dent is called pitting.  °CAUSES  °There are many possible causes of edema. Eating too much salt and being on your feet or sitting for a long time can cause edema in your legs and ankles. Hot weather may make edema worse. Common medical causes of edema include: °· Heart failure. °· Liver disease. °· Kidney disease. °· Weak blood vessels in your legs. °· Cancer. °· An injury. °· Pregnancy. °· Some medications. °· Obesity.  °SYMPTOMS  °Edema is usually painless. Your skin may look swollen or shiny.  °DIAGNOSIS  °Your health care provider may be able to diagnose edema by asking about your medical history and doing a physical exam. You may need to have tests such as X-rays, an electrocardiogram, or blood tests to check for medical conditions that may cause edema.  °TREATMENT  °Edema treatment depends on the cause. If you have heart, liver, or kidney disease, you need the treatment appropriate for these conditions. General treatment may include: °· Elevation of the affected body part above the level of your heart. °· Compression of the affected body part. Pressure from elastic bandages or support stockings squeezes the tissues and forces fluid back into the blood vessels. This keeps fluid from entering the tissues. °· Restriction of fluid and salt intake. °· Use of a water pill (diuretic). These medications are appropriate only for some types of edema. They pull fluid out of your body and make you urinate more often. This  gets rid of fluid and reduces swelling, but diuretics can have side effects. Only use diuretics as directed by your health care provider. °HOME CARE INSTRUCTIONS  °· Keep the affected body part above the level of your heart when you are lying down.   °· Do not sit still or stand for prolonged periods.   °· Do not put anything directly under your knees when lying down. °· Do not wear constricting clothing or garters on your upper legs.   °· Exercise your legs to work the fluid back into your blood vessels. This may help the swelling go down.   °· Wear elastic bandages or support stockings to reduce ankle swelling as directed by your health care provider.   °· Eat a low-salt diet to reduce fluid if your health care provider recommends it.   °· Only take medicines as directed by your health care provider.  °SEEK MEDICAL CARE IF:  °· Your edema is not responding to treatment. °· You have heart, liver, or kidney disease and notice symptoms of edema. °· You have edema in your legs that does not improve after elevating them.   °· You have sudden and unexplained weight gain. °SEEK IMMEDIATE MEDICAL CARE IF:  °· You develop shortness of breath or chest pain.   °· You cannot breathe when you lie down. °· You develop pain, redness, or warmth in the swollen areas.   °· You have heart, liver, or kidney disease and suddenly get edema. °· You have a fever and your symptoms suddenly get worse. °MAKE SURE YOU:  °·   Understand these instructions. °· Will watch your condition. °· Will get help right away if you are not doing well or get worse. °  °This information is not intended to replace advice given to you by your health care provider. Make sure you discuss any questions you have with your health care provider. °  °Document Released: 12/12/2005 Document Revised: 01/02/2015 Document Reviewed: 10/04/2013 °Elsevier Interactive Patient Education ©2016 [employer] ° °

## 2016-05-25 NOTE — Progress Notes (Signed)
Pre visit review using our clinic review tool, if applicable. No additional management support is needed unless otherwise documented below in the visit note. 

## 2016-05-25 NOTE — Progress Notes (Signed)
Subjective:  Patient ID: Andrew [name], male    DOB: 1928/02/15  Age: 82 y.o. MRN: [id number]  CC: Leg Swelling; Hypertension; Hypothyroidism; and Congestive Heart Failure   HPI [name] presents for Follow-up on multiple medical problems but his main complaint today is a 2-3 week history of left calf pain and swelling. He denies trauma or injury. The symptoms are unilateral on the left side. He denies episodes of chest pain, shortness of breath, palpitations, coughing, hemoptysis, edema, or fatigue.  Outpatient Prescriptions Prior to Visit  Medication Sig Dispense Refill  . acetaminophen (TYLENOL) 500 MG tablet Take 1,000 mg by mouth every 8 (eight) hours as needed for moderate pain.     Marland Kitchen ALPRAZolam (XANAX) 0.5 MG tablet TAKE 1/2 TABLET TWICE DAILY AS NEEDED FOR ANXIETY. 60 tablet 5  . amiodarone (PACERONE) 200 MG tablet Take 1 tablet (200 mg total) by mouth daily. 30 tablet 6  . aspirin EC 81 MG tablet Take 1 tablet (81 mg total) by mouth daily.    Marland Kitchen atorvastatin (LIPITOR) 80 MG tablet TAKE 1 TABLET ONCE DAILY. 90 tablet 3  . carvedilol (COREG) 3.125 MG tablet Take 1 tablet (3.125 mg total) by mouth 2 (two) times daily. 60 tablet   . carvedilol (COREG) 6.25 MG tablet TAKE 1 TABLET TWICE DAILY. 60 tablet 3  . cilostazol (PLETAL) 100 MG tablet Take 100 mg by mouth 2 (two) times daily.    . Coenzyme Q10 (CO Q 10 PO) Take 1 tablet by mouth daily.    Marland Kitchen ELIQUIS 2.5 MG TABS tablet TAKE 1 TABLET TWICE DAILY. 60 tablet 3  . erythromycin with ethanol (THERAMYCIN) 2 % external solution Apply 1 application topically daily as needed (for skin irritation around neck).    . ezetimibe (ZETIA) 10 MG tablet Take 10 mg by mouth daily.    Marland Kitchen FLUoxetine (PROZAC) 20 MG capsule TAKE (1) CAPSULE DAILY. 90 capsule 1  . furosemide (LASIX) 40 MG tablet Take 40 mg by mouth 2 (two) times daily.    Marland Kitchen glucosamine-chondroitin 500-400 MG tablet Take 1 tablet by mouth 2 (two) times daily.     . Hypromellose  (GENTEAL MILD) 0.2 % SOLN Place 1 drop into both eyes daily as needed (for dry eyes).     Marland Kitchen losartan (COZAAR) 100 MG tablet TAKE 1 TABLET ONCE DAILY. 90 tablet 1  . Misc Natural Products (NF FORMULAS TESTOSTERONE) CAPS Take 1 capsule by mouth daily.     . Multiple Vitamin (MULTIVITAMIN) tablet Take 1 tablet by mouth daily.    Marland Kitchen NITROSTAT 0.4 MG SL tablet DISSOLVE 1 TABLET UNDER TONGUE AS NEEDED FOR CHEST PAIN,MAY REPEAT IN5 MINUTES FOR 2 DOSES. 25 tablet 3  . omega-3 acid ethyl esters (LOVAZA) 1 G capsule Take 1 g by mouth 2 (two) times daily.    . polyethylene glycol (MIRALAX / GLYCOLAX) packet Take 17 g by mouth daily as needed (constipation).    . potassium chloride SA (K-DUR,KLOR-CON) 20 MEQ tablet TAKE 1 TABLET ONCE DAILY. 30 tablet 11  . tamsulosin (FLOMAX) 0.4 MG CAPS capsule Take 0.4 mg by mouth daily.    . temazepam (RESTORIL) 30 MG capsule TAKE 1 CAPSULE AT BEDTIME AS NEEDED FOR SLEEP. 30 capsule 3   No facility-administered medications prior to visit.    ROS Review of Systems  Constitutional: Negative.  Negative for fever, chills, diaphoresis, appetite change and fatigue.  HENT: Negative.   Eyes: Negative.   Respiratory: Negative.  Negative for cough,  choking, chest tightness, shortness of breath and stridor.   Cardiovascular: Positive for leg swelling. Negative for chest pain and palpitations.  Gastrointestinal: Negative.  Negative for nausea, vomiting, abdominal pain, diarrhea, constipation and blood in stool.  Endocrine: Negative.   Genitourinary: Negative.   Musculoskeletal: Negative.  Negative for myalgias, back pain, arthralgias and neck pain.  Skin: Negative.  Negative for color change, pallor and rash.  Allergic/Immunologic: Negative.   Neurological: Negative.  Negative for dizziness, tremors, syncope, numbness and headaches.  Hematological: Negative.  Negative for adenopathy. Does not bruise/bleed easily.  Psychiatric/Behavioral: Negative.     Objective:  BP  136/62 mmHg  Pulse 64  Temp(Src) 98.2 F (36.8 C) (Oral)  Resp 16  Ht 5\' 6"  (1.676 m)  Wt 180 lb (81.647 kg)  BMI 29.07 kg/m2  SpO2 94%  BP Readings from Last 3 Encounters:  05/25/16 136/62  04/14/16 110/60  03/30/16 104/43    Wt Readings from Last 3 Encounters:  05/25/16 180 lb (81.647 kg)  04/14/16 173 lb 12 oz (78.812 kg)  03/30/16 183 lb 9.6 oz (83.28 kg)    Physical Exam  Constitutional: He is oriented to person, place, and time. No distress.  HENT:  Mouth/Throat: Oropharynx is clear and moist. No oropharyngeal exudate.  Eyes: Conjunctivae are normal. Right eye exhibits no discharge. Left eye exhibits no discharge. No scleral icterus.  Neck: Normal range of motion. Neck supple. No JVD present. No tracheal deviation present. No thyromegaly present.  Cardiovascular: Normal rate, regular rhythm, normal heart sounds and intact distal pulses.  Exam reveals no gallop and no friction rub.   No murmur heard. Pulmonary/Chest: Effort normal and breath sounds normal. No stridor. No respiratory distress. He has no wheezes. He has no rales. He exhibits no tenderness.  Abdominal: Soft. Bowel sounds are normal. He exhibits no distension and no mass. There is no tenderness. There is no rebound and no guarding.  Musculoskeletal: Normal range of motion. He exhibits edema (1+ pitting edema in the left lower extremity).       Left lower leg: He exhibits tenderness, swelling and edema. He exhibits no bony tenderness, no deformity and no laceration.  Lymphadenopathy:    He has no cervical adenopathy.  Neurological: He is oriented to person, place, and time.  Skin: Skin is warm and dry. No rash noted. He is not diaphoretic. No erythema. No pallor.  Vitals reviewed.   Lab Results  Component Value Date   WBC 5.5 05/25/2016   HGB 10.9* 05/25/2016   HCT 32.5* 05/25/2016   PLT 158.0 05/25/2016   GLUCOSE 145* 05/25/2016   CHOL 126 10/17/2015   TRIG 33 10/17/2015   HDL 45 10/17/2015    LDLDIRECT 78.6 03/18/2010   LDLCALC 74 10/17/2015   ALT 43 01/01/2016   AST 42* 01/01/2016   NA 142 05/25/2016   K 4.0 05/25/2016   CL 105 05/25/2016   CREATININE 1.69* 05/25/2016   BUN 40* 05/25/2016   CO2 31 05/25/2016   TSH 6.23* 05/25/2016   PSA 2.79 01/23/2013   INR 1.18 09/17/2015   HGBA1C 6.8* 12/04/2015   MICROALBUR 13.2* 03/23/2015    No results found.  Assessment & Plan:   Orlyn was seen today for leg swelling, hypertension, hypothyroidism and congestive heart failure.  Diagnoses and all orders for this visit:  Calf pain, left- his d-dimer is slightly elevated but I don't think the elevation is significant in light of his age and other comorbid BLD illnesses, the vascular ultrasound is  positive for a small Baker's cyst but is negative for deep venous thrombosis, nonetheless he is artery anticoagulated with Ellik was -     D-dimer, quantitative (not at [facility]); Future -     VAS [place] LOWER EXTREMITY VENOUS (DVT); Future  Leg edema, left- see above, the ultrasound shows a small Baker's cyst but I believe it's too small for any intervention, will reassure him that he does not have a deep venous thrombosis and ask him to elevate and ice the left lower extremity. -     D-dimer, quantitative (not at [facility]); Future -     Brain natriuretic peptide; Future -     VAS [place] LOWER EXTREMITY VENOUS (DVT); Future  Chronic systolic CHF (congestive heart failure) ([place])- he has unilateral edema caused by a Baker's cyst but there is no evidence of fluid overload elsewhere at this time, his BNP is lower than it was before so I think overall his heart failure is adequately well controlled. -     Brain natriuretic peptide; Future  Essential hypertension- his blood pressure is well-controlled, electrolytes and renal function are stable. -     Basic metabolic panel; Future -     CBC with Differential/Platelet; Future  Other specified hypothyroidism- this is most likely related to his amiodarone  therapy, his TSH is minimally elevated so for now will not treat but we'll continue to observe the trend in his TSH level and monitoring for any symptoms. -     TSH; Future   I am having Mr. Malcomson maintain his glucosamine-chondroitin, omega-3 acid ethyl esters, NF FORMULAS TESTOSTERONE, multivitamin, aspirin EC, Hypromellose, erythromycin with ethanol, acetaminophen, Coenzyme Q10 (CO Q 10 PO), NITROSTAT, polyethylene glycol, cilostazol, tamsulosin, ezetimibe, temazepam, potassium chloride SA, ELIQUIS, carvedilol, losartan, FLUoxetine, furosemide, amiodarone, atorvastatin, ALPRAZolam, and carvedilol.  No orders of the defined types were placed in this encounter.     Follow-up: Return in about 3 weeks (around 06/15/2016).  [name], MD

## 2016-05-26 LAB — D-DIMER, QUANTITATIVE: D-Dimer, Quant: 1.57 ug/mL-FEU — ABNORMAL HIGH (ref 0.00–0.48)

## 2016-05-30 ENCOUNTER — Other Ambulatory Visit (HOSPITAL_COMMUNITY): Payer: Self-pay | Admitting: *Deleted

## 2016-05-30 MED ORDER — FUROSEMIDE 40 MG PO TABS: 80.0000 mg | ORAL_TABLET | Freq: Two times a day (BID) | ORAL | Status: DC

## 2016-05-30 MED ORDER — FUROSEMIDE 40 MG PO TABS
40.0000 mg | ORAL_TABLET | Freq: Two times a day (BID) | ORAL | Status: DC
Start: 2016-05-30 — End: 2016-05-30

## 2016-06-03 ENCOUNTER — Other Ambulatory Visit (HOSPITAL_COMMUNITY): Payer: Self-pay | Admitting: Cardiology

## 2016-06-05 ENCOUNTER — Other Ambulatory Visit (HOSPITAL_COMMUNITY): Payer: Self-pay | Admitting: Cardiology

## 2016-06-09 DIAGNOSIS — Z006 Encounter for examination for normal comparison and control in clinical research program: Secondary | ICD-10-CM

## 2016-06-09 NOTE — Progress Notes (Signed)
Subject present for Visit [place]. States "I am doing well, my weight is down and I feel really good." No questions or concerns today. Vitals baseline. Will return for Research Visit Oct 5 @ 11am.

## 2016-07-05 ENCOUNTER — Ambulatory Visit (INDEPENDENT_AMBULATORY_CARE_PROVIDER_SITE_OTHER): Payer: Medicare Other | Admitting: Podiatry

## 2016-07-05 ENCOUNTER — Encounter: Payer: Self-pay | Admitting: Podiatry

## 2016-07-05 DIAGNOSIS — B351 Tinea unguium: Secondary | ICD-10-CM

## 2016-07-05 DIAGNOSIS — M79676 Pain in unspecified toe(s): Secondary | ICD-10-CM | POA: Diagnosis not present

## 2016-07-05 DIAGNOSIS — I739 Peripheral vascular disease, unspecified: Secondary | ICD-10-CM

## 2016-07-05 NOTE — Progress Notes (Signed)
Patient ID: Andrew [name], male   DOB: Nov 07, 1928, 82 y.o.   MRN: [id number] Complaint:  Visit Type: Patient returns to my office for continued preventative foot care services. Complaint: Patient states" my nails have grown long and thick and become painful to walk and wear shoes" Patient has been diagnosed with DM with no foot complications. The patient presents for preventative foot care services. No changes to ROS  Podiatric Exam: Vascular: dorsalis pedis and posterior tibial pulses are palpable bilateral. Capillary return is immediate. Temperature gradient is WNL. Skin turgor WNL  Sensorium: Normal Semmes Weinstein monofilament test. Normal tactile sensation bilaterally. Nail Exam: Pt has thick disfigured discolored nails with subungual debris noted bilateral entire nail hallux through fifth toenails Ulcer Exam: There is no evidence of ulcer or pre-ulcerative changes or infection. Orthopedic Exam: Muscle tone and strength are WNL. No limitations in general ROM. No crepitus or effusions noted. Foot type and digits show no abnormalities. Bony prominences are unremarkable. 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 Skin: No Porokeratosis. No infection or ulcers  Diagnosis:  Onychomycosis, , Pain in right toe, pain in left toes  Treatment & Plan Procedures and Treatment: Consent by patient was obtained for treatment procedures. The patient understood the discussion of treatment and procedures well. All questions were answered thoroughly reviewed. Debridement of mycotic and hypertrophic toenails, 1 through 5 bilateral and clearing of subungual debris. No ulceration, no infection noted.  Return Visit-Office Procedure: Patient instructed to return to the office for a follow up visit 3 months for continued evaluation and treatment.   Gardiner Barefoot DPM

## 2016-07-11 ENCOUNTER — Ambulatory Visit (INDEPENDENT_AMBULATORY_CARE_PROVIDER_SITE_OTHER): Payer: Medicare Other | Admitting: Family Medicine

## 2016-07-11 VITALS — BP 120/70 | HR 73 | Temp 98.9°F | Resp 16 | Ht 66.0 in | Wt 182.8 lb

## 2016-07-11 DIAGNOSIS — Z85828 Personal history of other malignant neoplasm of skin: Secondary | ICD-10-CM | POA: Diagnosis not present

## 2016-07-11 DIAGNOSIS — D225 Melanocytic nevi of trunk: Secondary | ICD-10-CM | POA: Diagnosis not present

## 2016-07-11 NOTE — Patient Instructions (Signed)
     IF you received an x-ray today, you will receive an invoice from [facility]. Please contact [facility] at [phone] with questions or concerns regarding your invoice.   IF you received labwork today, you will receive an invoice from [employer]. Please contact [facility] at [phone] with questions or concerns regarding your invoice.   Our billing staff will not be able to assist you with questions regarding bills from these companies.  You will be contacted with the lab results as soon as they are available. The fastest way to get your results is to activate your My Chart account. Instructions are located on the last page of this paperwork. If you have not heard from [place] regarding the results in 2 weeks, please contact this office.      The area on your back is suspicious for possible skin cancer. For that reason, I do not think we should biopsy here, but should be evaluated by dermatology. I have placed a referral to the dermatologist, so me know if you do not hear from their office within the next 10 days. Okay to place a bandage over the area to keep it from getting irritated or bleeding, but if any bleeding that does not stop, return here or emergency room for evaluation.

## 2016-07-11 NOTE — Progress Notes (Signed)
By signing my name below I, Tereasa Coop, attest that this documentation has been prepared under the direction and in the presence of Wendie Agreste, MD. Electonically Signed. Tereasa Coop, Scribe 07/11/2016 at 11:44 AM  Subjective:    Patient ID: Andrew [name], male    DOB: January 01, 1928, 81 y.o.   MRN: [id number]  Chief Complaint  Patient presents with  . mole removal    on back. bleeding and painful     HPI Andrew [name] is a 82 y.o. male who presents to the [name] complaining of bleeding and painful mole on his back. The mole has been bleeding off and on for the past 3 weeks and became painful and tender within the past 3 days. Pt reports having a cancerous mole on his left hand that was removed more than 10 years ago. Pt has not seen his dermatologist in several years. Pt used antibiotic ointment on mole.  Pt has history of A-fib and takes eliquis. Pt also has history of DM.    Patient Active Problem List   Diagnosis Date Noted  . Calf pain 05/25/2016  . Leg edema, left 05/25/2016  . Carotid artery stenosis 01/12/2016  . Chronic systolic CHF (congestive heart failure) ([place]) 12/02/2015  . Cerebellar infarct ([place]) 10/16/2015  . 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 ([place]) 10/16/2015  . PVD (peripheral vascular disease) ([place])   . Hereditary and idiopathic peripheral neuropathy 10/02/2015  . Peripelvic (lymphatic) cyst   . Pernicious anemia 07/15/2015  . Bilateral renal cysts 07/12/2015  . Lung nodule, 63mm RML CT 07/12/15 07/12/2015  . Constipation 07/12/2015  . Kidney stone on right side 07/12/2015  . CKD (chronic kidney disease), stage III 06/14/2015  . Chronic diastolic CHF (congestive heart failure) ([place]) 05/28/2015  . Paroxysmal atrial fibrillation ([place]) 05/28/2015  . Routine general medical examination at a [name] facility 03/24/2015  . AAA (abdominal aortic aneurysm) without rupture ([place]) 09/30/2014  . Left-sided low back  pain without sciatica 07/21/2014  . Cardiomyopathy, ischemic 05/23/2014  . DM (diabetes mellitus), type 2 with renal complications ([place]) 0000000  . CORONARY ATHEROSCLEROSIS NATIVE CORONARY ARTERY 11/09/2010  . ANXIETY DEPRESSION 03/18/2010  . Hypothyroidism 02/03/2009  . Hyperlipidemia with target LDL less than 100 06/14/2007  . Essential hypertension 06/14/2007  . GERD 06/14/2007   Past Medical History  Diagnosis Date  . CAD (coronary artery disease)   . LVF (left ventricular failure) ([place])   . Carotid artery stenosis   . Hyperlipidemia   . Hypertension   . GERD (gastroesophageal reflux disease)   . IBS (irritable bowel syndrome)   . Arthritis   . Diabetes mellitus   . Thyroid disease   . Anemia   . Vitamin D deficiency   . Depression   . Fatty liver 2008  . Cancer ([place])     Melanoma - Back  . AAA (abdominal aortic aneurysm) ([place])   . Pneumonia Jan. 2014  . CHF (congestive heart failure) ([place])   . DVT (deep venous thrombosis) ([place])   . Paroxysmal atrial fibrillation ([facility])   . Typical atrial flutter ([place])   . Shortness of breath dyspnea   . Cyst of kidney, acquired    Past Surgical History  Procedure Laterality Date  . Total knee arthroplasty      bilateral- Pt fell 3 steps  . Cardiac stents  2005  . Coronary artery bypass graft  1992  . Joint replacement  Bilateral knee arthroplasty  . Eye surgery      [place]atar[place]  . Abdominal aortic aneurysm repair  2002  . Cardiac catheterization    . Left heart catheterization with coronary angiogram N/A 10/30/2014    Procedure: LEFT HEART CATHETERIZATION WITH CORONARY ANGIOGRAM;  Surgeon: Larey Dresser, MD;  Location: [facility] CATH LAB;  Service: Cardiovascular;  Laterality: N/A;  . [place]  June 2016    While in [place]  2016   Allergies  Allergen Reactions  . Metoprolol Shortness Of Breath and Palpitations    Heart starts racing. Shallow breathing   . Metformin And Related Nausea And Vomiting   Prior to Admission  medications   Medication Sig Start Date End Date Taking? Authorizing Provider  acetaminophen (TYLENOL) 500 MG tablet Take 1,000 mg by mouth every 8 (eight) hours as needed for moderate pain.    Yes Historical Provider, MD  ALPRAZolam (XANAX) 0.5 MG tablet TAKE 1/2 TABLET TWICE DAILY AS NEEDED FOR ANXIETY. 05/11/16  Yes Janith Lima, MD  amiodarone (PACERONE) 200 MG tablet Take 1 tablet (200 mg total) by mouth daily. 04/14/16  Yes Larey Dresser, MD  aspirin EC 81 MG tablet Take 1 tablet (81 mg total) by mouth daily. 05/22/14  Yes Larey Dresser, MD  atorvastatin (LIPITOR) 80 MG tablet TAKE 1 TABLET ONCE DAILY. 05/02/16  Yes Larey Dresser, MD  carvedilol (COREG) 3.125 MG tablet Take 1 tablet (3.125 mg total) by mouth 2 (two) times daily. 02/15/16  Yes Amy D Clegg, NP  cilostazol (PLETAL) 100 MG tablet Take 100 mg by mouth 2 (two) times daily.   Yes Historical Provider, MD  ELIQUIS 2.5 MG TABS tablet TAKE 1 TABLET TWICE DAILY. 06/07/16  Yes Larey Dresser, MD  erythromycin with ethanol [facility]) 2 % external solution Apply 1 application topically daily as needed (for skin irritation around neck).   Yes Historical Provider, MD  ezetimibe (ZETIA) 10 MG tablet Take 10 mg by mouth daily.   Yes Historical Provider, MD  FLUoxetine (PROZAC) 20 MG capsule TAKE (1) CAPSULE DAILY. 04/07/16  Yes Janith Lima, MD  furosemide (LASIX) 40 MG tablet Take 2 tablets (80 mg total) by mouth 2 (two) times daily. 05/30/16  Yes Amy D Clegg, NP  glucosamine-chondroitin 500-400 MG tablet Take 1 tablet by mouth 2 (two) times daily.    Yes Historical Provider, MD  Hypromellose (GENTEAL MILD) 0.2 % SOLN Place 1 drop into both eyes daily as needed (for dry eyes).    Yes Historical Provider, MD  losartan (COZAAR) 100 MG tablet TAKE 1 TABLET ONCE DAILY. 04/07/16  Yes Janith Lima, MD  Multiple Vitamin (MULTIVITAMIN) tablet Take 1 tablet by mouth daily.   Yes Historical Provider, MD  NITROSTAT 0.4 MG SL tablet DISSOLVE 1 TABLET  UNDER TONGUE AS NEEDED FOR CHEST PAIN,MAY REPEAT IN5 MINUTES FOR 2 DOSES. 07/13/15  Yes Janith Lima, MD  potassium chloride SA (K-DUR,KLOR-CON) 20 MEQ tablet TAKE 1 TABLET ONCE DAILY. 09/21/15  Yes Janith Lima, MD  tamsulosin (FLOMAX) 0.4 MG CAPS capsule Take 0.4 mg by mouth daily.   Yes Historical Provider, MD  temazepam (RESTORIL) 30 MG capsule TAKE 1 CAPSULE AT BEDTIME AS NEEDED FOR SLEEP. 09/14/15  Yes Janith Lima, MD   Social History   Social History  . Marital Status: Married    Spouse Name: N/A  . Number of Children: N/A  . Years of Education: N/A   Occupational History  . Retired  Social History Main Topics  . 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 Smokeless tobacco: Never Used     Comment: quit atleast 25 yrs ago, per pt  . Alcohol Use: 1.8 oz/week    3 Shots of liquor per week     Comment: socially  . Drug Use: No  . Sexual Activity: Yes   Other Topics Concern  . Not on file   Social History Narrative   Pt lives in [place] with spouse.   Retired from [employer].  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      Review of Systems  Constitutional: Negative for fever.  Skin: Positive for wound (bleeding, tender mole on back).       Objective:   Physical Exam  Constitutional: He is oriented to person, place, and time. He appears well-developed and well-nourished. No distress.  HENT:  Head: Normocephalic and atraumatic.  Eyes: Conjunctivae are normal. Pupils are equal, round, and reactive to light.  Neck: Neck supple.  Cardiovascular: Normal rate.   Pulmonary/Chest: Effort normal.  Musculoskeletal: Normal range of motion.  Neurological: He is alert and oriented to person, place, and time.  Skin: Skin is warm and dry.  Pt has a raw open appearing area measuring 69mm by 61mm, on pt's Mid back center aspect, with slightly elevated surrounding pink to red skin, measuring 72mm by 67mm. There are some lines radiating  out from central lesion.   Psychiatric: He has a normal mood and affect. His behavior is normal.  Nursing note and vitals reviewed.    Filed Vitals:   07/11/16 1128  BP: 120/70  Pulse: 73  Temp: 98.9 F (37.2 C)  TempSrc: Oral  Resp: 16  Height: 5\' 6"  (1.676 m)  Weight: 182 lb 12.8 oz (82.918 kg)  SpO2: 95%        Assessment & Plan:   Andrew [name] is a 82 y.o. male Nevus of back - Plan: Ambulatory referral to Dermatology  History of skin cancer - Plan: Ambulatory referral to Dermatology  Lesion on back suspicious for skin cancer. We'll have him evaluated by dermatology, biopsy was not performed in our office. Continue to keep covered with bandage as needed, and RTC precautions given if bleeding or worsening.  No orders of the defined types were placed in this encounter.   Patient Instructions       IF you received an x-ray today, you will receive an invoice from [facility]. Please contact [facility] Radiology at [phone] with questions or concerns regarding your invoice.   IF you received labwork today, you will receive an invoice from [employer]. Please contact [facility] at [phone] with questions or concerns regarding your invoice.   Our billing staff will not be able to assist you with questions regarding bills from these companies.  You will be contacted with the lab results as soon as they are available. The fastest way to get your results is to activate your My Chart account. Instructions are located on the last page of this paperwork. If you have not heard from [place] regarding the results in 2 weeks, please contact this office.      The area on your back is suspicious for possible skin cancer. For that reason, I do not think we should biopsy here, but should be evaluated by dermatology. I have placed a referral to the dermatologist, so me know if you do not hear from their office within the next 10 days.  Okay to  place a bandage over the area to keep it from getting irritated or bleeding, but if any bleeding that does not stop, return here or emergency room for evaluation.     I personally performed the services described in this documentation, which was scribed in my presence. The recorded information has been reviewed and considered, and addended by me as needed.   Signed,   Merri Ray, MD [facility].  07/11/2016 11:47 AM

## 2016-07-13 ENCOUNTER — Telehealth: Payer: Self-pay

## 2016-07-13 ENCOUNTER — Encounter (HOSPITAL_COMMUNITY): Payer: Medicare Other

## 2016-07-13 NOTE — Telephone Encounter (Signed)
Patient is on the list for [employer] 2017 and may be a good candidate for an AWV in 2017. Please let me know if/when appt is scheduled.   

## 2016-07-19 ENCOUNTER — Encounter (HOSPITAL_COMMUNITY): Payer: Medicare PPO

## 2016-07-19 ENCOUNTER — Ambulatory Visit: Payer: Medicare Other | Admitting: Family

## 2016-07-25 ENCOUNTER — Other Ambulatory Visit: Payer: Self-pay | Admitting: Internal Medicine

## 2016-07-26 ENCOUNTER — Ambulatory Visit (HOSPITAL_COMMUNITY)
Admission: RE | Admit: 2016-07-26 | Discharge: 2016-07-26 | Disposition: A | Discharge status: Home / Self Care | Payer: Medicare Other | Source: Ambulatory Visit | Attending: Cardiology | Admitting: Cardiology

## 2016-07-26 ENCOUNTER — Encounter (HOSPITAL_COMMUNITY): Payer: Self-pay

## 2016-07-26 VITALS — BP 108/56 | HR 68 | Wt 182.2 lb

## 2016-07-26 DIAGNOSIS — Z85820 Personal history of malignant melanoma of skin: Secondary | ICD-10-CM | POA: Insufficient documentation

## 2016-07-26 DIAGNOSIS — I639 Cerebral infarction, unspecified: Secondary | ICD-10-CM

## 2016-07-26 DIAGNOSIS — Z951 Presence of aortocoronary bypass graft: Secondary | ICD-10-CM | POA: Diagnosis not present

## 2016-07-26 DIAGNOSIS — Z8673 Personal history of transient ischemic attack (TIA), and cerebral infarction without residual deficits: Secondary | ICD-10-CM | POA: Insufficient documentation

## 2016-07-26 DIAGNOSIS — E785 Hyperlipidemia, unspecified: Secondary | ICD-10-CM | POA: Diagnosis not present

## 2016-07-26 DIAGNOSIS — E1122 Type 2 diabetes mellitus with diabetic chronic kidney disease: Secondary | ICD-10-CM | POA: Diagnosis not present

## 2016-07-26 DIAGNOSIS — I6523 Occlusion and stenosis of bilateral carotid arteries: Secondary | ICD-10-CM | POA: Insufficient documentation

## 2016-07-26 DIAGNOSIS — I130 Hypertensive heart and chronic kidney disease with heart failure and stage 1 through stage 4 chronic kidney disease, or unspecified chronic kidney disease: Secondary | ICD-10-CM | POA: Diagnosis not present

## 2016-07-26 DIAGNOSIS — Z8249 Family history of ischemic heart disease and other diseases of the circulatory system: Secondary | ICD-10-CM | POA: Diagnosis not present

## 2016-07-26 DIAGNOSIS — I255 Ischemic cardiomyopathy: Secondary | ICD-10-CM | POA: Diagnosis not present

## 2016-07-26 DIAGNOSIS — E038 Other specified hypothyroidism: Secondary | ICD-10-CM | POA: Diagnosis not present

## 2016-07-26 DIAGNOSIS — N183 Chronic kidney disease, stage 3 (moderate): Secondary | ICD-10-CM | POA: Diagnosis not present

## 2016-07-26 DIAGNOSIS — I480 Paroxysmal atrial fibrillation: Secondary | ICD-10-CM

## 2016-07-26 DIAGNOSIS — I739 Peripheral vascular disease, unspecified: Secondary | ICD-10-CM | POA: Diagnosis not present

## 2016-07-26 DIAGNOSIS — K589 Irritable bowel syndrome without diarrhea: Secondary | ICD-10-CM | POA: Diagnosis not present

## 2016-07-26 DIAGNOSIS — I2510 Atherosclerotic heart disease of native coronary artery without angina pectoris: Secondary | ICD-10-CM | POA: Diagnosis not present

## 2016-07-26 DIAGNOSIS — K219 Gastro-esophageal reflux disease without esophagitis: Secondary | ICD-10-CM | POA: Insufficient documentation

## 2016-07-26 DIAGNOSIS — I5022 Chronic systolic (congestive) heart failure: Secondary | ICD-10-CM | POA: Insufficient documentation

## 2016-07-26 DIAGNOSIS — Z7901 Long term (current) use of anticoagulants: Secondary | ICD-10-CM | POA: Diagnosis not present

## 2016-07-26 DIAGNOSIS — J449 Chronic obstructive pulmonary disease, unspecified: Secondary | ICD-10-CM | POA: Diagnosis not present

## 2016-07-26 DIAGNOSIS — Z7982 Long term (current) use of aspirin: Secondary | ICD-10-CM | POA: Insufficient documentation

## 2016-07-26 DIAGNOSIS — I5032 Chronic diastolic (congestive) heart failure: Secondary | ICD-10-CM

## 2016-07-26 DIAGNOSIS — Z79899 Other long term (current) drug therapy: Secondary | ICD-10-CM | POA: Diagnosis not present

## 2016-07-26 DIAGNOSIS — Z87891 Personal history of nicotine dependence: Secondary | ICD-10-CM | POA: Insufficient documentation

## 2016-07-26 LAB — T4, FREE: Free T4: 0.91 ng/dL (ref 0.61–1.12)

## 2016-07-26 LAB — COMPREHENSIVE METABOLIC PANEL
ALBUMIN: 3.6 g/dL (ref 3.5–5.0)
ALT: 35 U/L (ref 17–63)
AST: 37 U/L (ref 15–41)
Alkaline Phosphatase: 55 U/L (ref 38–126)
Anion gap: 7 (ref 5–15)
BUN: 44 mg/dL — AB (ref 6–20)
CHLORIDE: 108 mmol/L (ref 101–111)
CO2: 25 mmol/L (ref 22–32)
CREATININE: 1.71 mg/dL — AB (ref 0.61–1.24)
Calcium: 9.3 mg/dL (ref 8.9–10.3)
GFR calc Af Amer: 40 mL/min — ABNORMAL LOW (ref >60)
GFR, EST NON AFRICAN AMERICAN: 35 mL/min — AB (ref >60)
GLUCOSE: 135 mg/dL — AB (ref 65–99)
POTASSIUM: 4.2 mmol/L (ref 3.5–5.1)
Sodium: 140 mmol/L (ref 135–145)
Total Bilirubin: 0.5 mg/dL (ref 0.3–1.2)
Total Protein: 6.3 g/dL — ABNORMAL LOW (ref 6.5–8.1)

## 2016-07-26 LAB — CBC
HEMATOCRIT: 32.4 % — AB (ref 39.0–52.0)
HEMOGLOBIN: 10.4 g/dL — AB (ref 13.0–17.0)
MCH: 32.3 pg (ref 26.0–34.0)
MCHC: 32.1 g/dL (ref 30.0–36.0)
MCV: 100.6 fL — AB (ref 78.0–100.0)
Platelets: 143 10*3/uL — ABNORMAL LOW (ref 150–400)
RBC: 3.22 MIL/uL — AB (ref 4.22–5.81)
RDW: 12.9 % (ref 11.5–15.5)
WBC: 5.7 10*3/uL (ref 4.0–10.5)

## 2016-07-26 LAB — TSH: TSH: 8.65 u[IU]/mL — AB (ref 0.350–4.500)

## 2016-07-26 NOTE — Patient Instructions (Signed)
Labs today  We will contact you in 3 months to schedule your next appointment.  

## 2016-07-26 NOTE — Progress Notes (Signed)
Patient ID: Andrew [name], male   DOB: 1928-09-16, 82 y.o.   MRN: [id number] PCP: Dr. Ronnald Ramp Cardiology: Dr. [name]   82 yo with history of CKD, CAD s/p CABG, and ischemic cardiomyopathy with primarily diastolic CHF presents for cardiology followup.  He has a history of PAD followed at [facility].  I took him for [facility] in 11/15. 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  He had had occlusion of a relatively small LAD, there were right to left collaterals and no intervention was done (medical management).    He and his wife went on a cruise along the [place] in 5/16.  He admits to considerable dietary indiscretion (high sodium diet).  It appears that he developed a hypertensive crisis along with chest pain while on the ship. He went into atrial fibrillation and developed CHF.  He was taken to the hospital in [place], [place].  He was in the ICU for about a week on Bipap intermittently.  Troponin peaked at 0.09 during this admission.  He was diuresed and after about 2 wks left the hospital and was able to fly home.    Cardiolite (6/16) showed EF 42%, prior anterolateral MI, no ischemia. Echo in 10/16 with EF 40-45%.    He was admitted on 07/06/15 for RLQ pain and fever.  He was treated for UTI and had a kidney stone as well.  He developed SOB after IVF were given for AKI.  He was treated for acute COPD exacerbation as well as CHF decompensation.  He was diuresed with IV lasix.  His weight on admission was 179 and got as high as 188 after IVF. Discharge weight was 181.  He was admitted in 10/16 with a cerebellar CVA.  He has some resultant imbalance.   He was again admitted in 11/16 with acute on chronic systolic CHF.  This was a short admission that appeared to be precipitated by a sodium load from eating at a [place] [employer] .   He returns for HF follow up. Weight is up again today.  Says he has been eating more and not exercising as much due to hot weather.  Overall, feels like he is doing well.   He gardens and walks his dog, no dyspnea with mild to moderate activity.  Mild dyspnea walking up a hill.  No chest pain.  No orthopnea or PND.  No BRBPR or melena. He is in NSR today.  No recent falls.    Labs (12/14): K 4.8, creatinine 1.5 Labs (5/15): LDL 92 Labs (11/15): K 4.1, creatinine 1.5 Labs (12/15): K 4, creatinine 1.5 Labs (1/16): LFTs normal, LDL 51, HDL 47 Labs (5/16): TnI 0.09 Labs (6/16): K 4.8 => 4.4, creatinine 1.79 => 2.26, HCT 28.7 Labs (7/16) K 4.2, creatinine 1.98, LFTs normal, TSH normal Labs (8/16): HCT 27.1 Labs (9/16): hgb 8.1, K 4.3, creatinine 1.8, LFTs normal, TSH elevated Labs (10/16): LDL 74, HDL 45, TSH 5.19 (increased), free T3 and free T4 normal.  Labs (11/16): K 3.5, creatinine 1.47, AST 84, ALT 89 Labs (12/16): K 4.3, creatinine 1.58, AST 43, ALT 42 Labs (3/17): K 4.2, creatinine 1.4, hgb 10.7, AST 36, ALT 45 Labs (6/17): K 4, creatinine 1.69, hgb 10.9, proBNP 389, TSH mildly elevated  ECG: NSR, 1st degree AV block, IVCD QRS 132 msec  PMH: 1. CKD 2. HTN 3. Hyperlipidemia 4. AAA: s/p surgery with left renal artery bypass in 1997 5. GERD 6. IBS 7. Melanoma s/p reception 8. Diabetes:  Diet-controlled 9. Carotid stenosis: Carotid dopplers (4/15) with 40-59% bilateral stenosis. Carotid dopplers (7/16) with < 40% BICA stenosis.  Carotid dopplers (10/16) with 40-59% BICA stenosis.  10. PAD: 9/14 ABIs 0.91 right 1.09 left.  7/16 ABIs 0.86 right, 1.1 left; aortoiliac duplex with < 50% bilateral iliac stenosis.  11. CAD: Has super-dominant RCA. s/p CABG 1992.  He had Taxus DES to mid PDA in 5/02. LHC (1/05) with 90% ostial PDA (had DES to this vessel), totally occluded RIMA-PDA, totally occluded PLV, sequential SVG-PLV with 1 branch occluded.  LHC (11/15) with left main off the right cusp, 40-50% ostial left main, total occlusion of the proximal LAD (small vessel) with right to left collaterals, large super-dominant RCA with 50% ISR mid RCA, 50% ostial PDA,  patent SVG-PLV, RIMA-PDA known atretic. Lexiscan Cardiolite (6/16) with EF 42%, prior anterolateral MI, no ischemia.  12. Ischemic cardiomyopathy: Echo (12/09) with EF 45-50%, basal to mid inferolateral hypokinesis. Echo (6/15) with EF 45-50%, akinesis of the mid to apical inferolateral wall.  Echo (6/16) with EF 50-55%, mild LVH, inferior hypokinesis, mild MR.  Echo (10/16) with EF 40-45%, moderate LVH, mildly decreased RV systolic function.  13. OA 14. Atrial fibrillation/flutter: Paroxysmal.  Noted during 5/16 hospitalization in [place] and in 6/16.  He developed atrial flutter in 8/16, back in NSR by 9/16.  15. Emphysema: PFTs (8/16) with FVC 88%, FEV1 74%, ratio 81%, TLC 83%, DLCO 31%.  16. Renal cysts 17. Idiopathic peripheral neuropathy.  18. Anemia.  68. 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  FH: Brother with CAD.   ROS: All systems reviewed and negative except as per HPI.   Current Outpatient Prescriptions  Medication Sig Dispense Refill  . acetaminophen (TYLENOL) 500 MG tablet Take 1,000 mg by mouth every 8 (eight) hours as needed for moderate pain.     Marland Kitchen ALPRAZolam (XANAX) 0.5 MG tablet TAKE 1/2 TABLET TWICE DAILY AS NEEDED FOR ANXIETY. 60 tablet 5  . amiodarone (PACERONE) 200 MG tablet Take 1 tablet (200 mg total) by mouth daily. 30 tablet 6  . aspirin EC 81 MG tablet Take 1 tablet (81 mg total) by mouth daily.    Marland Kitchen atorvastatin (LIPITOR) 80 MG tablet TAKE 1 TABLET ONCE DAILY. 90 tablet 3  . carvedilol (COREG) 3.125 MG tablet Take 1 tablet (3.125 mg total) by mouth 2 (two) times daily. 60 tablet   . cilostazol (PLETAL) 100 MG tablet Take 100 mg by mouth 2 (two) times daily.    Marland Kitchen ELIQUIS 2.5 MG TABS tablet TAKE 1 TABLET TWICE DAILY. 60 tablet 0  . erythromycin with ethanol (THERAMYCIN) 2 % external solution Apply 1 application topically daily as needed (for skin irritation around neck).    .  ezetimibe (ZETIA) 10 MG tablet TAKE (1) TABLET DAILY. 90 tablet 3  . FLUoxetine (PROZAC) 20 MG capsule TAKE (1) CAPSULE DAILY. 90 capsule 1  . furosemide (LASIX) 40 MG tablet Take 2 tablets (80 mg total) by mouth 2 (two) times daily. 120 tablet 3  . glucosamine-chondroitin 500-400 MG tablet Take 1 tablet by mouth 2 (two) times daily.     . Hypromellose (GENTEAL MILD) 0.2 % SOLN Place 1 drop into both eyes daily as needed (for dry eyes).     Marland Kitchen losartan (COZAAR) 100 MG tablet TAKE 1 TABLET ONCE DAILY. 90 tablet 1  . Multiple Vitamin (MULTIVITAMIN) tablet Take 1 tablet by mouth daily.    Marland Kitchen  potassium chloride SA (K-DUR,KLOR-CON) 20 MEQ tablet TAKE 1 TABLET ONCE DAILY. 30 tablet 11  . tamsulosin (FLOMAX) 0.4 MG CAPS capsule Take 0.4 mg by mouth daily.    . temazepam (RESTORIL) 30 MG capsule TAKE 1 CAPSULE AT BEDTIME AS NEEDED FOR SLEEP. 30 capsule 3  . NITROSTAT 0.4 MG SL tablet DISSOLVE 1 TABLET UNDER TONGUE AS NEEDED FOR CHEST PAIN,MAY REPEAT IN5 MINUTES FOR 2 DOSES. (Patient not taking: Reported on 07/26/2016) 25 tablet 3   No current facility-administered medications for this encounter.    BP (!) 108/56   Pulse 68   Wt 182 lb 4 oz (82.7 kg)   SpO2 97%   BMI 29.42 kg/m   General: NAD. Ambulated in the clinic without difficulty Neck: JVP flat . No thyromegaly or thyroid nodule.  Lungs: Clear to auscultation bilaterally with normal respiratory effort. CV: Nondisplaced PMI.  Heart irregular S1/S2, no S3/S4, 2/6 early SEM RUSB. Trace ankle edema.  Right carotid bruit. Difficult to palpate pedal pulses.  Abdomen: Soft, nontender, non-distended, no hepatosplenomegaly.  Skin: Bilateral LE wounds, covered. Dressing clean, dry, and intact. Neurologic: Alert and oriented x 3.  Psych: Normal affect. Extremities: No clubbing or cyanosis. LUE sling.  Assessment/Plan:  1. CAD: s/p CABG.  Cath in 11/15 showed occluded LAD with left to right collaterals.  The LAD was a relatively small vessel  (super-dominant right).  Managed medically. He had chest pain in the setting of an admission to hospital in [place] with CHF exacerbation.  No chest pain since. Lexiscan Cardiolite in 6/16 with infarction but no ischemia.  - Continue ASA 81.   - Continue statin.  2. Ischemic cardiomyopathy with systolic CHF: EF [id number] on 10/16 echo.  NYHA class II symptoms currently.  Volume status ok on exam.  I think his weight gain is caloric rather than CHF-related.   - Continue current losartan 100 mg daily and Coreg 3.125 mg bid.  - Continue current Lasix, 80 mg bid.   3. Carotid stenosis: Stable, followed at [facility].  4. PAD: Followed at [facility].  Stable ABIs when last done.  Continue cilostazol, unable to come off due to severe calf pain when he stops it. He has claudication with moderate exertion chronically.  5. Hyperlipidemia: Good lipids in 10/16.  6. CKD III: BMET today.   7. Atrial fibrillation/flutter:  Paroxysmal, NSR today.  - Continue amiodarone to 200 mg.  Check LFTs today.  Mildly elevated TSH recently, will repeat TSH and check free T4/free T3.  Will need to get regular eye exams.  - Continue Eliquis 2.5 bid.  8. Suspected OSA: He cancelled his sleep study due to mask discomfort 9. COPD: History of smoking. PFTs suggestive of emphysema.  10. CVA: Cerebellar, 10/16.  He has had some resulting imbalance.  He is no longer driving.   Follow up 3 months.   Loralie Champagne 07/26/2016

## 2016-07-27 LAB — T3, FREE: T3 FREE: 2.3 pg/mL (ref 2.0–4.4)

## 2016-08-02 ENCOUNTER — Other Ambulatory Visit (HOSPITAL_COMMUNITY): Payer: Self-pay | Admitting: Cardiology

## 2016-08-02 ENCOUNTER — Other Ambulatory Visit: Payer: Self-pay | Admitting: Internal Medicine

## 2016-08-03 ENCOUNTER — Encounter: Payer: Self-pay | Admitting: Internal Medicine

## 2016-08-08 NOTE — Progress Notes (Unsigned)
[facility] results sent to scan.

## 2016-08-10 ENCOUNTER — Encounter: Payer: Self-pay | Admitting: Family Medicine

## 2016-08-23 ENCOUNTER — Encounter: Payer: Self-pay | Admitting: Family

## 2016-08-25 ENCOUNTER — Ambulatory Visit (INDEPENDENT_AMBULATORY_CARE_PROVIDER_SITE_OTHER)
Admission: RE | Admit: 2016-08-25 | Discharge: 2016-08-25 | Disposition: A | Discharge status: Home / Self Care | Payer: Medicare Other | Source: Ambulatory Visit | Attending: Family | Admitting: Family

## 2016-08-25 ENCOUNTER — Ambulatory Visit (INDEPENDENT_AMBULATORY_CARE_PROVIDER_SITE_OTHER): Payer: Medicare Other | Admitting: Family

## 2016-08-25 ENCOUNTER — Encounter: Payer: Self-pay | Admitting: Family

## 2016-08-25 ENCOUNTER — Ambulatory Visit (HOSPITAL_COMMUNITY)
Admission: RE | Admit: 2016-08-25 | Discharge: 2016-08-25 | Disposition: A | Discharge status: Home / Self Care | Payer: Medicare Other | Source: Ambulatory Visit | Attending: Family | Admitting: Family

## 2016-08-25 VITALS — BP 143/72 | HR 65 | Temp 100.3°F | Resp 20 | Ht 66.0 in | Wt 175.0 lb

## 2016-08-25 DIAGNOSIS — I70219 Atherosclerosis of native arteries of extremities with intermittent claudication, unspecified extremity: Secondary | ICD-10-CM

## 2016-08-25 DIAGNOSIS — Z9889 Other specified postprocedural states: Secondary | ICD-10-CM | POA: Diagnosis not present

## 2016-08-25 DIAGNOSIS — I779 Disorder of arteries and arterioles, unspecified: Secondary | ICD-10-CM

## 2016-08-25 DIAGNOSIS — Z8679 Personal history of other diseases of the circulatory system: Secondary | ICD-10-CM

## 2016-08-25 DIAGNOSIS — Z4889 Encounter for other specified surgical aftercare: Secondary | ICD-10-CM

## 2016-08-25 DIAGNOSIS — Z87891 Personal history of nicotine dependence: Secondary | ICD-10-CM | POA: Insufficient documentation

## 2016-08-25 DIAGNOSIS — I6523 Occlusion and stenosis of bilateral carotid arteries: Secondary | ICD-10-CM | POA: Diagnosis not present

## 2016-08-25 DIAGNOSIS — R938 Abnormal findings on diagnostic imaging of other specified body structures: Secondary | ICD-10-CM | POA: Insufficient documentation

## 2016-08-25 DIAGNOSIS — I714 Abdominal aortic aneurysm, without rupture, unspecified: Secondary | ICD-10-CM

## 2016-08-25 DIAGNOSIS — E119 Type 2 diabetes mellitus without complications: Secondary | ICD-10-CM | POA: Diagnosis not present

## 2016-08-25 DIAGNOSIS — Z48812 Encounter for surgical aftercare following surgery on the circulatory system: Secondary | ICD-10-CM

## 2016-08-25 LAB — VAS US CAROTID
LCCADDIAS: -13 cm/s
LCCADSYS: -72 cm/s
LEFT ECA DIAS: -6 cm/s
LEFT VERTEBRAL DIAS: 12 cm/s
LICADDIAS: -26 cm/s
LICADSYS: -79 cm/s
LICAPDIAS: 25 cm/s
LICAPSYS: 90 cm/s
Left CCA prox dias: 14 cm/s
Left CCA prox sys: 79 cm/s
RCCADSYS: -66 cm/s
RCCAPSYS: -83 cm/s
RIGHT CCA MID DIAS: 14 cm/s
RIGHT ECA DIAS: -7 cm/s
Right CCA prox dias: -13 cm/s

## 2016-08-25 NOTE — Progress Notes (Signed)
[facility] [place] HISTORY AND PHYSICAL   MRN : [id number]  History of Present Illness:   Andrew [name] is a 82 y.o. male patient of Dr. Kellie Simmering who is status post AAA repair and left renal artery bypass graft in 1997 by Dr. Amedeo Plenty.  He also has PAD and carotid artery stenosis. He returns today for follow up. After walking 2 blocks he has bilateral buttocks, thighs, and calves aching; this has been improving over time.  He denies any known lumbar spine problems. After resting for 30 seconds he can resume walking.  He does have known arthritis. He denies rest pain, denies non-healing wounds or ulcers.  Patient states that his blood pressure is usually 123456 systolic.  He had CHF in June 2016 while in [place], then had severe UTI early July 2016; since then he has had dehydration, anemia, weakness; feels he is slowly improving, is walking with a cane.  He denies any history of TIA or stroke symptoms, specifically he denies a history of amaurosis fugax or monocular blindness, unilateral facial drooping, hemiplegia, or receptive or expressive aphasia.   He denies any new back or abdominal pain.  He remains physically active. 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   Pt Diabetic: No, is now diet controlled  Pt smoker: former smoker, quit at age 78   Pt meds include:  Statin :Yes  Betablocker: No  ASA: no  Other anticoagulants/antiplatelets:  Pletal; Eliquis started early June 2016, prescribed by his cardiologist (per pt). It appears that he had transient atrial fib.    Current Outpatient Prescriptions  Medication Sig Dispense Refill  . acetaminophen (TYLENOL) 500 MG tablet Take 1,000 mg by mouth every 8 (eight) hours as needed for moderate pain.     Marland Kitchen ALPRAZolam (XANAX) 0.5 MG tablet TAKE 1/2 TABLET TWICE DAILY AS NEEDED FOR ANXIETY. 60 tablet 5  . amiodarone (PACERONE) 200 MG tablet Take 1 tablet (200 mg total) by mouth daily. 30 tablet 6  . aspirin EC 81  MG tablet Take 1 tablet (81 mg total) by mouth daily.    Marland Kitchen atorvastatin (LIPITOR) 80 MG tablet TAKE 1 TABLET ONCE DAILY. 90 tablet 3  . carvedilol (COREG) 3.125 MG tablet Take 1 tablet (3.125 mg total) by mouth 2 (two) times daily. 60 tablet   . cilostazol (PLETAL) 100 MG tablet TAKE 1 TABLET TWICE DAILY. 180 tablet 3  . ELIQUIS 2.5 MG TABS tablet TAKE 1 TABLET TWICE DAILY. 60 tablet 3  . erythromycin with ethanol (THERAMYCIN) 2 % external solution Apply 1 application topically daily as needed (for skin irritation around neck).    . ezetimibe (ZETIA) 10 MG tablet TAKE (1) TABLET DAILY. 90 tablet 3  . FLUoxetine (PROZAC) 20 MG capsule TAKE (1) CAPSULE DAILY. 90 capsule 1  . furosemide (LASIX) 40 MG tablet Take 2 tablets (80 mg total) by mouth 2 (two) times daily. 120 tablet 3  . glucosamine-chondroitin 500-400 MG tablet Take 1 tablet by mouth 2 (two) times daily.     . Hypromellose (GENTEAL MILD) 0.2 % SOLN Place 1 drop into both eyes daily as needed (for dry eyes).     Marland Kitchen losartan (COZAAR) 100 MG tablet TAKE 1 TABLET ONCE DAILY. 90 tablet 1  . Multiple Vitamin (MULTIVITAMIN) tablet Take 1 tablet by mouth daily.    Marland Kitchen NITROSTAT 0.4 MG SL tablet DISSOLVE 1 TABLET UNDER TONGUE AS NEEDED FOR CHEST PAIN,MAY REPEAT IN5 MINUTES FOR 2 DOSES. 25 tablet 3  . potassium chloride  SA (K-DUR,KLOR-CON) 20 MEQ tablet TAKE 1 TABLET ONCE DAILY. 30 tablet 11  . tamsulosin (FLOMAX) 0.4 MG CAPS capsule Take 0.4 mg by mouth daily.    . temazepam (RESTORIL) 30 MG capsule TAKE 1 CAPSULE AT BEDTIME AS NEEDED FOR SLEEP. 30 capsule 3   No current facility-administered medications for this visit.     Past Medical History:  Diagnosis Date  . AAA (abdominal aortic aneurysm) ([place])   . Anemia   . Arthritis   . CAD (coronary artery disease)   . Cancer ([place])    Melanoma - Back  . Carotid artery stenosis   . CHF (congestive heart failure) ([place])   . Cyst of kidney, acquired   . Depression   . Diabetes mellitus   . DVT  (deep venous thrombosis) ([place])   . Fatty liver 2008  . GERD (gastroesophageal reflux disease)   . Hyperlipidemia   . Hypertension   . IBS (irritable bowel syndrome)   . LVF (left ventricular failure) ([place])   . Paroxysmal atrial fibrillation ([facility])   . Pneumonia Jan. 2014  . Shortness of breath dyspnea   . Thyroid disease   . Typical atrial flutter ([place])   . Vitamin D deficiency     Social History Social History  Substance Use Topics  . 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 Smokeless tobacco: Never Used     Comment: quit atleast 25 yrs ago, per pt  . Alcohol use 1.8 oz/week    3 Shots of liquor per week     Comment: socially    Family History Family History  Problem Relation Age of Onset  . Colon cancer Father   . Cancer Father   . Coronary artery disease Brother   . Diabetes Brother   . Cancer Brother     throat and abdominal  . Heart disease Brother   . Hyperlipidemia Brother   . Peripheral vascular disease Brother     Varicose Veins  . Diabetes Son   . Hyperlipidemia Son     Surgical History Past Surgical History:  Procedure Laterality Date  . ABDOMINAL AORTIC ANEURYSM REPAIR  2002  . CARDIAC CATHETERIZATION    . cardiac stents  2005  . CORONARY ARTERY BYPASS GRAFT  1992  . EYE SURGERY     [place]  . JOINT REPLACEMENT     Bilateral knee arthroplasty  . LEFT HEART CATHETERIZATION WITH CORONARY ANGIOGRAM N/A 10/30/2014   Procedure: LEFT HEART CATHETERIZATION WITH CORONARY ANGIOGRAM;  Surgeon: Larey Dresser, MD;  Location[facility] CATH LAB;  Service: Cardiovascular;  Laterality: N/A;  . TOTAL KNEE ARTHROPLASTY     bilateral- Pt fell 3 steps  . UTI  June 2016   While in [place]  2016    Allergies  Allergen Reactions  . Metoprolol Shortness Of Breath and Palpitations    Heart starts racing. Shallow breathing   . Metformin And Related Nausea And Vomiting    Current Outpatient Prescriptions  Medication Sig Dispense Refill  . acetaminophen (TYLENOL) 500 MG tablet  Take 1,000 mg by mouth every 8 (eight) hours as needed for moderate pain.     Marland Kitchen ALPRAZolam (XANAX) 0.5 MG tablet TAKE 1/2 TABLET TWICE DAILY AS NEEDED FOR ANXIETY. 60 tablet 5  . amiodarone (PACERONE) 200 MG tablet Take 1 tablet (200 mg total) by mouth daily. 30 tablet 6  . aspirin EC 81 MG tablet Take 1 tablet (81 mg total) by mouth daily.    Marland Kitchen atorvastatin (LIPITOR) 80 MG  tablet TAKE 1 TABLET ONCE DAILY. 90 tablet 3  . carvedilol (COREG) 3.125 MG tablet Take 1 tablet (3.125 mg total) by mouth 2 (two) times daily. 60 tablet   . cilostazol (PLETAL) 100 MG tablet TAKE 1 TABLET TWICE DAILY. 180 tablet 3  . ELIQUIS 2.5 MG TABS tablet TAKE 1 TABLET TWICE DAILY. 60 tablet 3  . erythromycin with ethanol (THERAMYCIN) 2 % external solution Apply 1 application topically daily as needed (for skin irritation around neck).    . ezetimibe (ZETIA) 10 MG tablet TAKE (1) TABLET DAILY. 90 tablet 3  . FLUoxetine (PROZAC) 20 MG capsule TAKE (1) CAPSULE DAILY. 90 capsule 1  . furosemide (LASIX) 40 MG tablet Take 2 tablets (80 mg total) by mouth 2 (two) times daily. 120 tablet 3  . glucosamine-chondroitin 500-400 MG tablet Take 1 tablet by mouth 2 (two) times daily.     . Hypromellose (GENTEAL MILD) 0.2 % SOLN Place 1 drop into both eyes daily as needed (for dry eyes).     Marland Kitchen losartan (COZAAR) 100 MG tablet TAKE 1 TABLET ONCE DAILY. 90 tablet 1  . Multiple Vitamin (MULTIVITAMIN) tablet Take 1 tablet by mouth daily.    Marland Kitchen NITROSTAT 0.4 MG SL tablet DISSOLVE 1 TABLET UNDER TONGUE AS NEEDED FOR CHEST PAIN,MAY REPEAT IN5 MINUTES FOR 2 DOSES. 25 tablet 3  . potassium chloride SA (K-DUR,KLOR-CON) 20 MEQ tablet TAKE 1 TABLET ONCE DAILY. 30 tablet 11  . tamsulosin (FLOMAX) 0.4 MG CAPS capsule Take 0.4 mg by mouth daily.    . temazepam (RESTORIL) 30 MG capsule TAKE 1 CAPSULE AT BEDTIME AS NEEDED FOR SLEEP. 30 capsule 3   No current facility-administered medications for this visit.      REVIEW OF SYSTEMS: See HPI for  pertinent positives and negatives.  Physical Examination Vitals:   08/25/16 1118 08/25/16 1120  BP: (!) 143/75 (!) 143/72  Pulse: 65   Resp: 20   Temp: 100.3 F (37.9 C)   TempSrc: Oral   SpO2: 97%   Weight: 175 lb (79.4 kg)   Height: 5\' 6"  (1.676 m)    Body mass index is 28.25 kg/m.  General: WDWN in NAD  Gait: normal HENT: WNL  Eyes: Pupils equal  Pulmonary: normal non-labored breathing, CTAB Cardiac: RRR, no detected murmur.   Abdomen: soft, NT, moderate sized asymptomatic reducible ventral hernia with use of abdominal muscles. Skin: no rashes, no ulcers, no cellulitis.  VASCULAR EXAM  Carotid Bruits  Left  Right    Negative  Negative    Radial pulses are 2+ palpable bilaterally Aorta is not palpable  VASCULAR EXAM:  Extremities without ischemic changes  without Gangrene; without open wounds.   LE Pulses  LEFT  RIGHT   FEMORAL  palpable  palpable   POPLITEAL  not palpable  not palpable   POSTERIOR TIBIAL  not palpable  not palpable   DORSALIS PEDIS  ANTERIOR TIBIAL  Faintly palpable  2+palpable    Musculoskeletal: mild generalized muscle wasting and atrophy; no peripheral edema  Neurologic: A&O X 3; Appropriate Affect ;   MOTOR FUNCTION: 4/5 in upper extremities, 3/5 in lower extremities, Symmetric,  CN 2-12 intact except is somewhat hard of hearing. Speech is fluent/normal     Non-Invasive Vascular Imaging:   ASSESSMENT:  Andrew [name] is a 82 y.o. male who is status post AAA repair and left renal artery bypass graft in 1997. He also has PAD and carotid artery stenosis. He has mild claudication with walking,  no signs of ischemia in his lower extremities. He has no history of stroke or TIA.  DATA Today's carotid Duplex suggests minimal bilateral ICA stenoses; vertebral arteries are antegrade, subclavian arteries are multiphasic.  Today's bilateral aortoiliac Duplex suggests less than 50% bilateral iliac  artery stenosis and a patent left renal artery bypass. No significant change compared to exam of 07/17/15. ABI's indicate normal perfusion in the right leg with mono and triphasic waveforms (normal TBI), and non compressible vessels in the left leg with mono and triphasic waveforms ( 0.66 TBI).  No significant stenosis in his iliac arteries to account for pain in his hips with walking.  He has fallen a few times, once with an evaluation recently in the ED. He is not a candidate for a graduated walking program, see Plan.  PLAN:   Daily seated leg exercises as discussed and demonstrated.  Based on today's exam and non-invasive vascular lab results, the patient will follow up in 1 year with the following tests: ABI's and carotid duplex.  I discussed in depth with the patient the nature of atherosclerosis, and emphasized the importance of maximal medical management including strict control of blood pressure, blood glucose, and lipid levels, obtaining regular exercise, and cessation of smoking.  The patient is aware that without maximal medical management the underlying atherosclerotic disease process will progress, limiting the benefit of any interventions.  The patient was given information about stroke prevention and what symptoms should prompt the patient to seek immediate medical care.  The patient was given information about PAD including signs, symptoms, treatment, what symptoms should prompt the patient to seek immediate medical care, and risk reduction measures to take. Thank you for allowing [place] to participate in this patient's care.  Clemon Chambers, RN, MSN, FNP-C [facility] Office: [phone]  Clinic MD: [facility] 08/25/2016 11:36 AM

## 2016-08-25 NOTE — Patient Instructions (Signed)
Peripheral Vascular Disease Peripheral vascular disease (PVD) is a disease of the blood vessels that are not part of your heart and brain. A simple term for PVD is poor circulation. In most cases, PVD narrows the blood vessels that carry blood from your heart to the rest of your body. This can result in a decreased supply of blood to your arms, legs, and internal organs, like your stomach or kidneys. However, it most often affects a person's lower legs and feet. There are two types of PVD.  Organic PVD. This is the more common type. It is caused by damage to the structure of blood vessels.  Functional PVD. This is caused by conditions that make blood vessels contract and tighten (spasm). Without treatment, PVD tends to get worse over time. PVD can also lead to acute ischemic limb. This is when an arm or limb suddenly has trouble getting enough blood. This is a medical emergency. CAUSES Each type of PVD has many different causes. The most common cause of PVD is buildup of a fatty material (plaque) inside of your arteries (atherosclerosis). Small amounts of plaque can break off from the walls of the blood vessels and become lodged in a smaller artery. This blocks blood flow and can cause acute ischemic limb. Other common causes of PVD include:  Blood clots that form inside of blood vessels.  Injuries to blood vessels.  Diseases that cause inflammation of blood vessels or cause blood vessel spasms.  Health behaviors and health history that increase your risk of developing PVD. RISK FACTORS  You may have a greater risk of PVD if you:  Have a family history of PVD.  Have certain medical conditions, including:  High cholesterol.  Diabetes.  High blood pressure (hypertension).  Coronary heart disease.  Past problems with blood clots.  Past injury, such as burns or a broken bone. These may have damaged blood vessels in your limbs.  Buerger disease. This is caused by inflamed blood  vessels in your hands and feet.  Some forms of arthritis.  Rare birth defects that affect the arteries in your legs.  Use tobacco.  Do not get enough exercise.  Are obese.  Are age 50 or older. SIGNS AND SYMPTOMS  PVD may cause many different symptoms. Your symptoms depend on what part of your body is not getting enough blood. Some common signs and symptoms include:  Cramps in your lower legs. This may be a symptom of poor leg circulation (claudication).  Pain and weakness in your legs while you are physically active that goes away when you rest (intermittent claudication).  Leg pain when at rest.  Leg numbness, tingling, or weakness.  Coldness in a leg or foot, especially when compared with the other leg.  Skin or hair changes. These can include:  Hair loss.  Shiny skin.  Pale or bluish skin.  Thick toenails.  Inability to get or maintain an erection (erectile dysfunction). People with PVD are more prone to developing ulcers and sores on their toes, feet, or legs. These may take longer than normal to heal. DIAGNOSIS Your health care provider may diagnose PVD from your signs and symptoms. The health care provider will also do a physical exam. You may have tests to find out what is causing your PVD and determine its severity. Tests may include:  Blood pressure recordings from your arms and legs and measurements of the strength of your pulses (pulse volume recordings).  Imaging studies using sound waves to take pictures of   the blood flow through your blood vessels (Doppler ultrasound).  Injecting a dye into your blood vessels before having imaging studies using:  X-rays (angiogram or arteriogram).  Computer-generated X-rays (CT angiogram).  A powerful electromagnetic field and a computer (magnetic resonance angiogram or MRA). TREATMENT Treatment for PVD depends on the cause of your condition and the severity of your symptoms. It also depends on your age. Underlying  causes need to be treated and controlled. These include long-lasting (chronic) conditions, such as diabetes, high cholesterol, and high blood pressure. You may need to first try making lifestyle changes and taking medicines. Surgery may be needed if these do not work. Lifestyle changes may include:  Quitting smoking.  Exercising regularly.  Following a low-fat, low-cholesterol diet. Medicines may include:  Blood thinners to prevent blood clots.  Medicines to improve blood flow.  Medicines to improve your blood cholesterol levels. Surgical procedures may include:  A procedure that uses an inflated balloon to open a blocked artery and improve blood flow (angioplasty).  A procedure to put in a tube (stent) to keep a blocked artery open (stent implant).  Surgery to reroute blood flow around a blocked artery (peripheral bypass surgery).  Surgery to remove dead tissue from an infected wound on the affected limb.  Amputation. This is surgical removal of the affected limb. This may be necessary in cases of acute ischemic limb that are not improved through medical or surgical treatments. HOME CARE INSTRUCTIONS  Take medicines only as directed by your health care provider.  Do not use any tobacco products, including cigarettes, chewing tobacco, or electronic cigarettes. If you need help quitting, ask your health care provider.  Lose weight if you are overweight, and maintain a healthy weight as directed by your health care provider.  Eat a diet that is low in fat and cholesterol. If you need help, ask your health care provider.  Exercise regularly. Ask your health care provider to suggest some good activities for you.  Use compression stockings or other mechanical devices as directed by your health care provider.  Take good care of your feet.  Wear comfortable shoes that fit well.  Check your feet often for any cuts or sores. SEEK MEDICAL CARE IF:  You have cramps in your legs  while walking.  You have leg pain when you are at rest.  You have coldness in a leg or foot.  Your skin changes.  You have erectile dysfunction.  You have cuts or sores on your feet that are not healing. SEEK IMMEDIATE MEDICAL CARE IF:  Your arm or leg turns cold and blue.  Your arms or legs become red, warm, swollen, painful, or numb.  You have chest pain or trouble breathing.  You suddenly have weakness in your face, arm, or leg.  You become very confused or lose the ability to speak.  You suddenly have a very bad headache or lose your vision.   This information is not intended to replace advice given to you by your health care provider. Make sure you discuss any questions you have with your health care provider.   Document Released: 01/19/2005 Document Revised: 01/02/2015 Document Reviewed: 05/22/2014 Elsevier Interactive Patient Education 2016 Elsevier Inc.    Stroke Prevention Some medical conditions and behaviors are associated with an increased chance of having a stroke. You may prevent a stroke by making healthy choices and managing medical conditions. HOW CAN I REDUCE MY RISK OF HAVING A STROKE?   Stay physically active. Get at   least 30 minutes of activity on most or all days.  Do not smoke. It may also be helpful to avoid exposure to secondhand smoke.  Limit alcohol use. Moderate alcohol use is considered to be:  No more than 2 drinks per day for men.  No more than 1 drink per day for nonpregnant women.  Eat healthy foods. This involves:  Eating 5 or more servings of fruits and vegetables a day.  Making dietary changes that address high blood pressure (hypertension), high cholesterol, diabetes, or obesity.  Manage your cholesterol levels.  Making food choices that are high in fiber and low in saturated fat, trans fat, and cholesterol may control cholesterol levels.  Take any prescribed medicines to control cholesterol as directed by your health care  provider.  Manage your diabetes.  Controlling your carbohydrate and sugar intake is recommended to manage diabetes.  Take any prescribed medicines to control diabetes as directed by your health care provider.  Control your hypertension.  Making food choices that are low in salt (sodium), saturated fat, trans fat, and cholesterol is recommended to manage hypertension.  Ask your health care provider if you need treatment to lower your blood pressure. Take any prescribed medicines to control hypertension as directed by your health care provider.  If you are 18-39 years of age, have your blood pressure checked every 3-5 years. If you are 40 years of age or older, have your blood pressure checked every year.  Maintain a healthy weight.  Reducing calorie intake and making food choices that are low in sodium, saturated fat, trans fat, and cholesterol are recommended to manage weight.  Stop drug abuse.  Avoid taking birth control pills.  Talk to your health care provider about the risks of taking birth control pills if you are over 35 years old, smoke, get migraines, or have ever had a blood clot.  Get evaluated for sleep disorders (sleep apnea).  Talk to your health care provider about getting a sleep evaluation if you snore a lot or have excessive sleepiness.  Take medicines only as directed by your health care provider.  For some people, aspirin or blood thinners (anticoagulants) are helpful in reducing the risk of forming abnormal blood clots that can lead to stroke. If you have the irregular heart rhythm of atrial fibrillation, you should be on a blood thinner unless there is a good reason you cannot take them.  Understand all your medicine instructions.  Make sure that other conditions (such as anemia or atherosclerosis) are addressed. SEEK IMMEDIATE MEDICAL CARE IF:   You have sudden weakness or numbness of the face, arm, or leg, especially on one side of the body.  Your face  or eyelid droops to one side.  You have sudden confusion.  You have trouble speaking (aphasia) or understanding.  You have sudden trouble seeing in one or both eyes.  You have sudden trouble walking.  You have dizziness.  You have a loss of balance or coordination.  You have a sudden, severe headache with no known cause.  You have new chest pain or an irregular heartbeat. Any of these symptoms may represent a serious problem that is an emergency. Do not wait to see if the symptoms will go away. Get medical help at once. Call your local emergency services (911 in [place] Do not drive yourself to the hospital.   This information is not intended to replace advice given to you by your health care provider. Make sure you discuss any questions   you have with your health care provider.   Document Released: 01/19/2005 Document Revised: 01/02/2015 Document Reviewed: 06/14/2013 Elsevier Interactive Patient Education 2016 Elsevier Inc.  

## 2016-08-26 LAB — HM DIABETES EYE EXAM

## 2016-08-26 NOTE — Addendum Note (Signed)
Addended by: Reola Calkins on: 08/26/2016 03:49 PM   Modules accepted: Orders

## 2016-08-30 ENCOUNTER — Encounter: Payer: Self-pay | Admitting: Internal Medicine

## 2016-08-30 ENCOUNTER — Telehealth: Payer: Self-pay

## 2016-08-30 ENCOUNTER — Other Ambulatory Visit: Payer: Self-pay | Admitting: Internal Medicine

## 2016-08-30 NOTE — Telephone Encounter (Signed)
Call to speak to Andrew [name];  Agreed to schedule fup with Dr. Ronnald Ramp at 10:30 am on 10/11 and will have his AWV p his visit at 11am   Also asked about test strips and they have been reordered

## 2016-09-22 ENCOUNTER — Other Ambulatory Visit: Payer: Self-pay | Admitting: Internal Medicine

## 2016-09-28 ENCOUNTER — Ambulatory Visit (INDEPENDENT_AMBULATORY_CARE_PROVIDER_SITE_OTHER): Payer: Medicare Other | Admitting: Cardiology

## 2016-09-28 ENCOUNTER — Encounter: Payer: Self-pay | Admitting: Cardiology

## 2016-09-28 ENCOUNTER — Ambulatory Visit: Payer: Medicare Other

## 2016-09-28 VITALS — BP 102/40 | HR 62 | Ht 66.0 in | Wt 182.0 lb

## 2016-09-28 DIAGNOSIS — I739 Peripheral vascular disease, unspecified: Secondary | ICD-10-CM

## 2016-09-28 DIAGNOSIS — I480 Paroxysmal atrial fibrillation: Secondary | ICD-10-CM | POA: Diagnosis not present

## 2016-09-28 DIAGNOSIS — I5022 Chronic systolic (congestive) heart failure: Secondary | ICD-10-CM | POA: Diagnosis not present

## 2016-09-28 DIAGNOSIS — I70213 Atherosclerosis of native arteries of extremities with intermittent claudication, bilateral legs: Secondary | ICD-10-CM | POA: Diagnosis not present

## 2016-09-28 NOTE — Patient Instructions (Signed)
Medication Instructions:  Your physician recommends that you continue on your current medications as directed. Please refer to the Current Medication list given to you today.   Labwork: None  Testing/Procedures: Your physician has requested that you have a lower extremity arterial duplex. This test is an ultrasound of the arteries in the legs . It looks at arterial blood flow in the legs . Allow one hour for Lower  Arterial scans. There are no restrictions or special instructions  Schedule an appointment for an aorta iliac duplex.  Follow-Up: Your physician recommends that you schedule a follow-up appointment in: December 2017 with Dr [name] in the [facility].        If you need a refill on your cardiac medications before your next appointment, please call your pharmacy.

## 2016-09-29 ENCOUNTER — Other Ambulatory Visit: Payer: Self-pay | Admitting: Cardiology

## 2016-09-29 DIAGNOSIS — I779 Disorder of arteries and arterioles, unspecified: Secondary | ICD-10-CM

## 2016-09-29 NOTE — Progress Notes (Signed)
Patient ID: Andrew [name], male   DOB: February 05, 1928, 82 y.o.   MRN: [id number] PCP: Dr. Ronnald Ramp Cardiology: Dr. [name]   82 yo with history of CKD, CAD s/p CABG, and ischemic cardiomyopathy with primarily diastolic CHF presents for cardiology followup.  He has a history of PAD followed at [facility].  I took him for [facility] in 11/15. 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  He had had occlusion of a relatively small LAD, there were right to left collaterals and no intervention was done (medical management).    He and his wife went on a cruise along the [employer] in 5/16.  He admits to considerable dietary indiscretion (high sodium diet).  It appears that he developed a hypertensive crisis along with chest pain while on the ship. He went into atrial fibrillation and developed CHF.  He was taken to the hospital in [place], [place].  He was in the ICU for about a week on Bipap intermittently.  Troponin peaked at 0.09 during this admission.  He was diuresed and after about 2 wks left the hospital and was able to fly home.    Cardiolite (6/16) showed EF 42%, prior anterolateral MI, no ischemia. Echo in 10/16 with EF 40-45%.    He was admitted on 07/06/15 for RLQ pain and fever.  He was treated for UTI and had a kidney stone as well.  He developed SOB after IVF were given for AKI.  He was treated for acute COPD exacerbation as well as CHF decompensation.  He was diuresed with IV lasix.  His weight on admission was 179 and got as high as 188 after IVF. Discharge weight was 181.  He was admitted in 10/16 with a cerebellar CVA.  He has some resultant imbalance.   He was again admitted in 11/16 with acute on chronic systolic CHF.  This was a short admission that appeared to be precipitated by a sodium load from eating at a [employer] .   No chest pain, palpitations, orthopnea/PND.  He was added on today due to increased leg pain with walking.  For the last several days, he has had pain in the thighs and  buttocks bilaterally after walking about 20 feet.  No pain at rest.  Aortoiliac doppler evaluation in 8/17 actually did not show severe disease.  However, he says that the current symptoms are a definite change.      Labs (12/14): K 4.8, creatinine 1.5 Labs (5/15): LDL 92 Labs (11/15): K 4.1, creatinine 1.5 Labs (12/15): K 4, creatinine 1.5 Labs (1/16): LFTs normal, LDL 51, HDL 47 Labs (5/16): TnI 0.09 Labs (6/16): K 4.8 => 4.4, creatinine 1.79 => 2.26, HCT 28.7 Labs (7/16) K 4.2, creatinine 1.98, LFTs normal, TSH normal Labs (8/16): HCT 27.1 Labs (9/16): hgb 8.1, K 4.3, creatinine 1.8, LFTs normal, TSH elevated Labs (10/16): LDL 74, HDL 45, TSH 5.19 (increased), free T3 and free T4 normal.  Labs (11/16): K 3.5, creatinine 1.47, AST 84, ALT 89 Labs (12/16): K 4.3, creatinine 1.58, AST 43, ALT 42 Labs (3/17): K 4.2, creatinine 1.4, hgb 10.7, AST 36, ALT 45 Labs (6/17): K 4, creatinine 1.69, hgb 10.9, proBNP 389, TSH mildly elevated Labs (8/17): Free T4 and T3 normal, TSH mildly elevated, K 4.2, creatinine 1.7, LFTs normal, hgb 10.4  PMH: 1. CKD 2. HTN 3. Hyperlipidemia 4. AAA: s/p surgery with left renal artery bypass in 1997 5. GERD 6. IBS 7. Melanoma s/p reception 8. Diabetes: Diet-controlled 9.  Carotid stenosis: Carotid dopplers (4/15) with 40-59% bilateral stenosis. Carotid dopplers (7/16) with < 40% BICA stenosis.  Carotid dopplers (10/16) with 40-59% BICA stenosis.  - Carotids (8/17) with minimal disease.  10. PAD: 9/14 ABIs 0.91 right 1.09 left.  7/16 ABIs 0.86 right, 1.1 left; aortoiliac duplex with < 50% bilateral iliac stenosis.  - Aortoiliac duplex (8/17) with < 50% bilateral iliac stenosis.   11. CAD: Has super-dominant RCA. s/p CABG 1992.  He had Taxus DES to mid PDA in 5/02. LHC (1/05) with 90% ostial PDA (had DES to this vessel), totally occluded RIMA-PDA, totally occluded PLV, sequential SVG-PLV with 1 branch occluded.  LHC (11/15) with left main off the right cusp,  40-50% ostial left main, total occlusion of the proximal LAD (small vessel) with right to left collaterals, large super-dominant RCA with 50% ISR mid RCA, 50% ostial PDA, patent SVG-PLV, RIMA-PDA known atretic. Lexiscan Cardiolite (6/16) with EF 42%, prior anterolateral MI, no ischemia.  12. Ischemic cardiomyopathy: Echo (12/09) with EF 45-50%, basal to mid inferolateral hypokinesis. Echo (6/15) with EF 45-50%, akinesis of the mid to apical inferolateral wall.  Echo (6/16) with EF 50-55%, mild LVH, inferior hypokinesis, mild MR.  Echo (10/16) with EF 40-45%, moderate LVH, mildly decreased RV systolic function.  13. OA 14. Atrial fibrillation/flutter: Paroxysmal.  Noted during 5/16 hospitalization in [place] and in 6/16.  He developed atrial flutter in 8/16, back in NSR by 9/16.  15. Emphysema: PFTs (8/16) with FVC 88%, FEV1 74%, ratio 81%, TLC 83%, DLCO 31%.  16. Renal cysts 17. Idiopathic peripheral neuropathy.  18. Anemia.  78. 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  FH: Brother with CAD.   ROS: All systems reviewed and negative except as per HPI.   Current Outpatient Prescriptions  Medication Sig Dispense Refill  . ACCU-CHEK AVIVA PLUS test strip CHECK BLOOD SUGAR 4 TIMES A DAY. 100 each 11  . acetaminophen (TYLENOL) 500 MG tablet Take 1,000 mg by mouth every 8 (eight) hours as needed for moderate pain.     Marland Kitchen ALPRAZolam (XANAX) 0.5 MG tablet TAKE 1/2 TABLET TWICE DAILY AS NEEDED FOR ANXIETY. 60 tablet 5  . amiodarone (PACERONE) 200 MG tablet Take 1 tablet (200 mg total) by mouth daily. 30 tablet 6  . aspirin EC 81 MG tablet Take 1 tablet (81 mg total) by mouth daily.    Marland Kitchen atorvastatin (LIPITOR) 80 MG tablet TAKE 1 TABLET ONCE DAILY. 90 tablet 3  . carvedilol (COREG) 3.125 MG tablet Take 1 tablet (3.125 mg total) by mouth 2 (two) times daily. 60 tablet   . cilostazol (PLETAL) 100 MG tablet TAKE 1 TABLET TWICE  DAILY. 180 tablet 3  . ELIQUIS 2.5 MG TABS tablet TAKE 1 TABLET TWICE DAILY. 60 tablet 3  . erythromycin with ethanol (THERAMYCIN) 2 % external solution Apply 1 application topically daily as needed (for skin irritation around neck).    . ezetimibe (ZETIA) 10 MG tablet TAKE (1) TABLET DAILY. 90 tablet 3  . FLUoxetine (PROZAC) 20 MG capsule TAKE (1) CAPSULE DAILY. 90 capsule 1  . furosemide (LASIX) 40 MG tablet Take 2 tablets (80 mg total) by mouth 2 (two) times daily. 120 tablet 3  . glucosamine-chondroitin 500-400 MG tablet Take 1 tablet by mouth 2 (two) times daily.     . Hypromellose (GENTEAL MILD) 0.2 % SOLN Place 1 drop into both eyes daily as needed (for dry eyes).     Marland Kitchen  losartan (COZAAR) 100 MG tablet TAKE 1 TABLET ONCE DAILY. 90 tablet 1  . Multiple Vitamin (MULTIVITAMIN) tablet Take 1 tablet by mouth daily.    Marland Kitchen NITROSTAT 0.4 MG SL tablet DISSOLVE 1 TABLET UNDER TONGUE AS NEEDED FOR CHEST PAIN,MAY REPEAT IN5 MINUTES FOR 2 DOSES. 25 tablet 3  . potassium chloride SA (K-DUR,KLOR-CON) 20 MEQ tablet TAKE 1 TABLET ONCE DAILY. 30 tablet 11  . tamsulosin (FLOMAX) 0.4 MG CAPS capsule Take 0.4 mg by mouth daily.    . temazepam (RESTORIL) 30 MG capsule TAKE 1 CAPSULE AT BEDTIME AS NEEDED FOR SLEEP. 30 capsule 3   No current facility-administered medications for this visit.    BP (!) 102/40   Pulse 62   Ht 5\' 6"  (1.676 m)   Wt 182 lb (82.6 kg)   BMI 29.38 kg/m   General: NAD. Ambulated in the clinic without difficulty Neck: JVP flat . No thyromegaly or thyroid nodule.  Lungs: Clear to auscultation bilaterally with normal respiratory effort. CV: Nondisplaced PMI.  Heart irregular S1/S2, no S3/S4, 2/6 early SEM RUSB. Trace ankle edema.  Right carotid bruit. Difficult to palpate pedal pulses.  Abdomen: Soft, nontender, non-distended, no hepatosplenomegaly.  Skin: Bilateral LE wounds, covered. Dressing clean, dry, and intact. Neurologic: Alert and oriented x 3.  Psych: Normal  affect. Extremities: No clubbing or cyanosis. LUE sling.  Assessment/Plan:  1. CAD: s/p CABG.  Cath in 11/15 showed occluded LAD with left to right collaterals.  The LAD was a relatively small vessel (super-dominant right).  Managed medically. He had chest pain in the setting of an admission to hospital in [place] with CHF exacerbation.  No chest pain since. Lexiscan Cardiolite in 6/16 with infarction but no ischemia.  - Continue ASA 81.   - Continue statin.  2. Ischemic cardiomyopathy with systolic CHF: EF 53-61% on 10/16 echo.  NYHA class II symptoms currently.  Volume status ok on exam.  - Continue current losartan 100 mg daily and Coreg 3.125 mg bid.  - Continue current Lasix, 80 mg bid.   3. Carotid stenosis: Stable, followed at [facility].  4. PAD: Increased claudication-like symptoms bilaterally over the last week, there has been a definite change.  - Continue cilostazol, unable to come off due to severe calf pain when he stops it.  - He had peripheral vascular evaluation in 8/17, but given significant recent change in his symptoms, I will repeat a peripheral arterial doppler evaluation.  If there has been significant change, he will need to go back to [facility]. 5. Hyperlipidemia: Good lipids in 10/16, repeat after next appointment.  6. CKD III.   7. Atrial fibrillation/flutter:  Paroxysmal, NSR today.  - Continue amiodarone 200 mg.  Recent LFTs normal.  Mildly elevated TSH recently but free T3 and T4 normal.  Will need to get regular eye exams. - Continue Eliquis 2.5 bid.  8. Suspected OSA: He cancelled his sleep study due to mask discomfort 9. COPD: History of smoking. PFTs suggestive of emphysema.  10. CVA: Cerebellar, 10/16.  He has had some resulting imbalance.  He is no longer driving.   Follow up in 12/17 in CHF clinic.    Loralie Champagne 09/29/2016

## 2016-09-30 NOTE — Addendum Note (Signed)
Addended by: Katrine Coho on: 09/30/2016 09:46 AM   Modules accepted: Orders

## 2016-10-03 DIAGNOSIS — Z006 Encounter for examination for normal comparison and control in clinical research program: Secondary | ICD-10-CM

## 2016-10-03 NOTE — Progress Notes (Signed)
Patient present for [place] Heart Failure Study visit 6. He states "I am feeling good, I have been taking my study medication without any problems." No adverse events per patient report. Good medication compliance as patient did return study medication bottles. Blood pressure within limits to dispense medication at maintained dose. Patient completed study questionnaires. No other questions or concerns today. Visit 7 scheduled for Jan 19, 2017.

## 2016-10-04 ENCOUNTER — Encounter: Payer: Self-pay | Admitting: Podiatry

## 2016-10-04 ENCOUNTER — Ambulatory Visit (INDEPENDENT_AMBULATORY_CARE_PROVIDER_SITE_OTHER): Payer: Medicare Other | Admitting: Podiatry

## 2016-10-04 DIAGNOSIS — B351 Tinea unguium: Secondary | ICD-10-CM | POA: Diagnosis not present

## 2016-10-04 DIAGNOSIS — I739 Peripheral vascular disease, unspecified: Secondary | ICD-10-CM

## 2016-10-04 DIAGNOSIS — M79676 Pain in unspecified toe(s): Secondary | ICD-10-CM | POA: Diagnosis not present

## 2016-10-04 NOTE — Progress Notes (Signed)
Patient ID: Andrew [name], male   DOB: 03/01/1928, 82 y.o.   MRN: [id number] Complaint:  Visit Type: Patient returns to my office for continued preventative foot care services. Complaint: Patient states" my nails have grown long and thick and become painful to walk and wear shoes" Patient has been diagnosed with DM with no foot complications. The patient presents for preventative foot care services. No changes to ROS  Podiatric Exam: Vascular: dorsalis pedis and posterior tibial pulses are palpable bilateral. Capillary return is immediate. Temperature gradient is WNL. Skin turgor WNL  Sensorium: Normal Semmes Weinstein monofilament test. Normal tactile sensation bilaterally. Nail Exam: Pt has thick disfigured discolored nails with subungual debris noted bilateral entire nail hallux through fifth toenails Ulcer Exam: There is no evidence of ulcer or pre-ulcerative changes or infection. Orthopedic Exam: Muscle tone and strength are WNL. No limitations in general ROM. No crepitus or effusions noted. Foot type and digits show no abnormalities. Bony prominences are unremarkable. 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 Skin: No Porokeratosis. No infection or ulcers  Diagnosis:  Onychomycosis, , Pain in right toe, pain in left toes  Treatment & Plan Procedures and Treatment: Consent by patient was obtained for treatment procedures. The patient understood the discussion of treatment and procedures well. All questions were answered thoroughly reviewed. Debridement of mycotic and hypertrophic toenails, 1 through 5 bilateral and clearing of subungual debris. No ulceration, no infection noted.  Return Visit-Office Procedure: Patient instructed to return to the office for a follow up visit 3 months for continued evaluation and treatment.   Gardiner Barefoot DPM

## 2016-10-05 ENCOUNTER — Ambulatory Visit: Payer: Medicare Other | Admitting: Internal Medicine

## 2016-10-10 ENCOUNTER — Other Ambulatory Visit: Payer: Self-pay | Admitting: Internal Medicine

## 2016-10-21 ENCOUNTER — Ambulatory Visit (HOSPITAL_COMMUNITY)
Admission: RE | Admit: 2016-10-21 | Discharge: 2016-10-21 | Disposition: A | Discharge status: Home / Self Care | Payer: Medicare Other | Source: Ambulatory Visit | Attending: Cardiology | Admitting: Cardiology

## 2016-10-21 DIAGNOSIS — I739 Peripheral vascular disease, unspecified: Secondary | ICD-10-CM | POA: Insufficient documentation

## 2016-10-21 DIAGNOSIS — I779 Disorder of arteries and arterioles, unspecified: Secondary | ICD-10-CM | POA: Insufficient documentation

## 2016-10-26 ENCOUNTER — Other Ambulatory Visit: Payer: Self-pay | Admitting: Internal Medicine

## 2016-11-03 ENCOUNTER — Other Ambulatory Visit: Payer: Self-pay | Admitting: Cardiology

## 2016-11-04 ENCOUNTER — Other Ambulatory Visit: Payer: Self-pay | Admitting: Internal Medicine

## 2016-11-09 ENCOUNTER — Other Ambulatory Visit: Payer: Self-pay | Admitting: Cardiology

## 2016-11-11 NOTE — Addendum Note (Signed)
Addended by: Lianne Cure A on: 11/11/2016 03:00 PM   Modules accepted: Orders

## 2016-11-19 ENCOUNTER — Other Ambulatory Visit: Payer: Self-pay | Admitting: Internal Medicine

## 2016-11-21 NOTE — Telephone Encounter (Signed)
rx faxed

## 2016-11-28 ENCOUNTER — Encounter (HOSPITAL_COMMUNITY): Payer: Self-pay

## 2016-11-28 ENCOUNTER — Ambulatory Visit (HOSPITAL_COMMUNITY)
Admission: RE | Admit: 2016-11-28 | Discharge: 2016-11-28 | Disposition: A | Discharge status: Home / Self Care | Payer: Medicare Other | Source: Ambulatory Visit | Attending: Cardiology | Admitting: Cardiology

## 2016-11-28 VITALS — BP 124/64 | HR 82 | Wt 190.8 lb

## 2016-11-28 DIAGNOSIS — Z7982 Long term (current) use of aspirin: Secondary | ICD-10-CM | POA: Diagnosis not present

## 2016-11-28 DIAGNOSIS — Z951 Presence of aortocoronary bypass graft: Secondary | ICD-10-CM | POA: Insufficient documentation

## 2016-11-28 DIAGNOSIS — E785 Hyperlipidemia, unspecified: Secondary | ICD-10-CM | POA: Insufficient documentation

## 2016-11-28 DIAGNOSIS — I4892 Unspecified atrial flutter: Secondary | ICD-10-CM | POA: Diagnosis not present

## 2016-11-28 DIAGNOSIS — K219 Gastro-esophageal reflux disease without esophagitis: Secondary | ICD-10-CM | POA: Insufficient documentation

## 2016-11-28 DIAGNOSIS — Z87891 Personal history of nicotine dependence: Secondary | ICD-10-CM | POA: Diagnosis not present

## 2016-11-28 DIAGNOSIS — I5022 Chronic systolic (congestive) heart failure: Secondary | ICD-10-CM | POA: Insufficient documentation

## 2016-11-28 DIAGNOSIS — Z8673 Personal history of transient ischemic attack (TIA), and cerebral infarction without residual deficits: Secondary | ICD-10-CM | POA: Diagnosis not present

## 2016-11-28 DIAGNOSIS — N281 Cyst of kidney, acquired: Secondary | ICD-10-CM | POA: Diagnosis not present

## 2016-11-28 DIAGNOSIS — Z9889 Other specified postprocedural states: Secondary | ICD-10-CM | POA: Diagnosis not present

## 2016-11-28 DIAGNOSIS — I252 Old myocardial infarction: Secondary | ICD-10-CM | POA: Insufficient documentation

## 2016-11-28 DIAGNOSIS — I130 Hypertensive heart and chronic kidney disease with heart failure and stage 1 through stage 4 chronic kidney disease, or unspecified chronic kidney disease: Secondary | ICD-10-CM | POA: Insufficient documentation

## 2016-11-28 DIAGNOSIS — D649 Anemia, unspecified: Secondary | ICD-10-CM | POA: Diagnosis not present

## 2016-11-28 DIAGNOSIS — N183 Chronic kidney disease, stage 3 (moderate): Secondary | ICD-10-CM | POA: Diagnosis not present

## 2016-11-28 DIAGNOSIS — J439 Emphysema, unspecified: Secondary | ICD-10-CM | POA: Insufficient documentation

## 2016-11-28 DIAGNOSIS — I6523 Occlusion and stenosis of bilateral carotid arteries: Secondary | ICD-10-CM | POA: Diagnosis not present

## 2016-11-28 DIAGNOSIS — I255 Ischemic cardiomyopathy: Secondary | ICD-10-CM | POA: Diagnosis not present

## 2016-11-28 DIAGNOSIS — I739 Peripheral vascular disease, unspecified: Secondary | ICD-10-CM | POA: Diagnosis not present

## 2016-11-28 DIAGNOSIS — K589 Irritable bowel syndrome without diarrhea: Secondary | ICD-10-CM | POA: Insufficient documentation

## 2016-11-28 DIAGNOSIS — I480 Paroxysmal atrial fibrillation: Secondary | ICD-10-CM | POA: Insufficient documentation

## 2016-11-28 DIAGNOSIS — I2510 Atherosclerotic heart disease of native coronary artery without angina pectoris: Secondary | ICD-10-CM | POA: Insufficient documentation

## 2016-11-28 DIAGNOSIS — Z85820 Personal history of malignant melanoma of skin: Secondary | ICD-10-CM | POA: Diagnosis not present

## 2016-11-28 DIAGNOSIS — E1122 Type 2 diabetes mellitus with diabetic chronic kidney disease: Secondary | ICD-10-CM | POA: Insufficient documentation

## 2016-11-28 LAB — LIPID PANEL
Cholesterol: 103 mg/dL (ref 0–200)
HDL: 39 mg/dL — ABNORMAL LOW (ref >40)
LDL CALC: 45 mg/dL (ref 0–99)
TRIGLYCERIDES: 93 mg/dL (ref <150)
Total CHOL/HDL Ratio: 2.6 RATIO
VLDL: 19 mg/dL (ref 0–40)

## 2016-11-28 LAB — COMPREHENSIVE METABOLIC PANEL
ALK PHOS: 45 U/L (ref 38–126)
ALT: 51 U/L (ref 17–63)
AST: 49 U/L — AB (ref 15–41)
Albumin: 3.6 g/dL (ref 3.5–5.0)
Anion gap: 6 (ref 5–15)
BUN: 37 mg/dL — AB (ref 6–20)
CALCIUM: 9.1 mg/dL (ref 8.9–10.3)
CO2: 27 mmol/L (ref 22–32)
CREATININE: 1.83 mg/dL — AB (ref 0.61–1.24)
Chloride: 107 mmol/L (ref 101–111)
GFR, EST AFRICAN AMERICAN: 37 mL/min — AB (ref >60)
GFR, EST NON AFRICAN AMERICAN: 32 mL/min — AB (ref >60)
Glucose, Bld: 84 mg/dL (ref 65–99)
Potassium: 4.4 mmol/L (ref 3.5–5.1)
SODIUM: 140 mmol/L (ref 135–145)
Total Bilirubin: 0.9 mg/dL (ref 0.3–1.2)
Total Protein: 6.4 g/dL — ABNORMAL LOW (ref 6.5–8.1)

## 2016-11-28 LAB — CBC
HEMATOCRIT: 32.9 % — AB (ref 39.0–52.0)
Hemoglobin: 10.8 g/dL — ABNORMAL LOW (ref 13.0–17.0)
MCH: 32.7 pg (ref 26.0–34.0)
MCHC: 32.8 g/dL (ref 30.0–36.0)
MCV: 99.7 fL (ref 78.0–100.0)
PLATELETS: 143 10*3/uL — AB (ref 150–400)
RBC: 3.3 MIL/uL — ABNORMAL LOW (ref 4.22–5.81)
RDW: 13 % (ref 11.5–15.5)
WBC: 6.2 10*3/uL (ref 4.0–10.5)

## 2016-11-28 LAB — BRAIN NATRIURETIC PEPTIDE: B Natriuretic Peptide: 238.8 pg/mL — ABNORMAL HIGH (ref 0.0–100.0)

## 2016-11-28 LAB — TSH: TSH: 10.504 u[IU]/mL — ABNORMAL HIGH (ref 0.350–4.500)

## 2016-11-28 MED ORDER — FUROSEMIDE 80 MG PO TABS: ORAL_TABLET | ORAL | 3 refills | Status: DC

## 2016-11-28 NOTE — Progress Notes (Signed)
Patient ID: Andrew [name], male   DOB: 1928-10-27, 82 y.o.   MRN: [id number] PCP: Dr. Ronnald Ramp Cardiology: Dr. [name]   82 yo with history of CKD, CAD s/p CABG, and ischemic cardiomyopathy with primarily diastolic CHF presents for cardiology followup.  He has a history of PAD followed at [facility].  I took him for [facility] in 11/15. 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  He had had occlusion of a relatively small LAD, there were right to left collaterals and no intervention was done (medical management).    He and his wife went on a cruise along the [employer] in 5/16.  He admits to considerable dietary indiscretion (high sodium diet).  It appears that he developed a hypertensive crisis along with chest pain while on the ship. He went into atrial fibrillation and developed CHF.  He was taken to the hospital in [place], [place].  He was in the ICU for about a week on Bipap intermittently.  Troponin peaked at 0.09 during this admission.  He was diuresed and after about 2 wks left the hospital and was able to fly home.    Cardiolite (6/16) showed EF 42%, prior anterolateral MI, no ischemia. Echo in 10/16 with EF 40-45%.    He was admitted on 07/06/15 for RLQ pain and fever.  He was treated for UTI and had a kidney stone as well.  He developed SOB after IVF were given for AKI.  He was treated for acute COPD exacerbation as well as CHF decompensation.  He was diuresed with IV lasix.  His weight on admission was 179 and got as high as 188 after IVF. Discharge weight was 181.  He was admitted in 10/16 with a cerebellar CVA.  He has some resultant imbalance.   He was again admitted in 11/16 with acute on chronic systolic CHF.  This was a short admission that appeared to be precipitated by a sodium load from eating at a [employer] .   No chest pain, palpitations, orthopnea/PND.  He is able to walk about 1 block, then has bilateral calf pain. He is not short of breath after walking a block.  Weight is up  8 lbs and he has more lower extremity edema.  Ok walking up a flight of steps.    Labs (12/14): K 4.8, creatinine 1.5 Labs (5/15): LDL 92 Labs (11/15): K 4.1, creatinine 1.5 Labs (12/15): K 4, creatinine 1.5 Labs (1/16): LFTs normal, LDL 51, HDL 47 Labs (5/16): TnI 0.09 Labs (6/16): K 4.8 => 4.4, creatinine 1.79 => 2.26, HCT 28.7 Labs (7/16) K 4.2, creatinine 1.98, LFTs normal, TSH normal Labs (8/16): HCT 27.1 Labs (9/16): hgb 8.1, K 4.3, creatinine 1.8, LFTs normal, TSH elevated Labs (10/16): LDL 74, HDL 45, TSH 5.19 (increased), free T3 and free T4 normal.  Labs (11/16): K 3.5, creatinine 1.47, AST 84, ALT 89 Labs (12/16): K 4.3, creatinine 1.58, AST 43, ALT 42 Labs (3/17): K 4.2, creatinine 1.4, hgb 10.7, AST 36, ALT 45 Labs (6/17): K 4, creatinine 1.69, hgb 10.9, proBNP 389, TSH mildly elevated Labs (8/17): Free T4 and T3 normal, TSH mildly elevated, K 4.2, creatinine 1.7, LFTs normal, hgb 10.4  PMH: 1. CKD 2. HTN 3. Hyperlipidemia 4. AAA: s/p surgery with left renal artery bypass in 1997 5. GERD 6. IBS 7. Melanoma s/p reception 8. Diabetes: Diet-controlled 9. Carotid stenosis: Carotid dopplers (4/15) with 40-59% bilateral stenosis. Carotid dopplers (7/16) with < 40% BICA stenosis.  Carotid dopplers (  10/16) with 40-59% BICA stenosis.  - Carotids (8/17) with minimal disease.  10. PAD: 9/14 ABIs 0.91 right 1.09 left.  7/16 ABIs 0.86 right, 1.1 left; aortoiliac duplex with < 50% bilateral iliac stenosis.  - Aortoiliac duplex (8/17) with < 50% bilateral iliac stenosis.   - ABIs (10/17): left 0.91, right 0.98, left TBI 0.44 (abnormal).  11. CAD: Has super-dominant RCA. s/p CABG 1992.  He had Taxus DES to mid PDA in 5/02. LHC (1/05) with 90% ostial PDA (had DES to this vessel), totally occluded RIMA-PDA, totally occluded PLV, sequential SVG-PLV with 1 branch occluded.  LHC (11/15) with left main off the right cusp, 40-50% ostial left main, total occlusion of the proximal LAD (small  vessel) with right to left collaterals, large super-dominant RCA with 50% ISR mid RCA, 50% ostial PDA, patent SVG-PLV, RIMA-PDA known atretic. Lexiscan Cardiolite (6/16) with EF 42%, prior anterolateral MI, no ischemia.  12. Ischemic cardiomyopathy: Echo (12/09) with EF 45-50%, basal to mid inferolateral hypokinesis. Echo (6/15) with EF 45-50%, akinesis of the mid to apical inferolateral wall.  Echo (6/16) with EF 50-55%, mild LVH, inferior hypokinesis, mild MR.  Echo (10/16) with EF 40-45%, moderate LVH, mildly decreased RV systolic function.  13. OA 14. Atrial fibrillation/flutter: Paroxysmal.  Noted during 5/16 hospitalization in [place] and in 6/16.  He developed atrial flutter in 8/16, back in NSR by 9/16.  15. Emphysema: PFTs (8/16) with FVC 88%, FEV1 74%, ratio 81%, TLC 83%, DLCO 31%.  16. Renal cysts 17. Idiopathic peripheral neuropathy.  18. Anemia.  17. 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  FH: Brother with CAD.   ROS: All systems reviewed and negative except as per HPI.   Current Outpatient Prescriptions  Medication Sig Dispense Refill  . ACCU-CHEK AVIVA PLUS test strip CHECK BLOOD SUGAR 4 TIMES A DAY. 100 each 11  . acetaminophen (TYLENOL) 500 MG tablet Take 1,000 mg by mouth every 8 (eight) hours as needed for moderate pain.     Andrew Kitchen ALPRAZolam (XANAX) 0.5 MG tablet TAKE 1/2 TABLET TWICE DAILY AS NEEDED FOR ANXIETY. 60 tablet 5  . amiodarone (PACERONE) 200 MG tablet Take 1 tablet (200 mg total) by mouth daily. 30 tablet 6  . aspirin EC 81 MG tablet Take 1 tablet (81 mg total) by mouth daily.    Andrew Kitchen atorvastatin (LIPITOR) 80 MG tablet TAKE 1 TABLET ONCE DAILY. 90 tablet 3  . carvedilol (COREG) 3.125 MG tablet Take 1 tablet (3.125 mg total) by mouth 2 (two) times daily. 60 tablet 3  . cilostazol (PLETAL) 100 MG tablet TAKE 1 TABLET TWICE DAILY. 180 tablet 3  . ELIQUIS 2.5 MG TABS tablet TAKE 1 TABLET  TWICE DAILY. 60 tablet 3  . erythromycin with ethanol (THERAMYCIN) 2 % external solution Apply 1 application topically daily as needed (for skin irritation around neck).    . ezetimibe (ZETIA) 10 MG tablet TAKE (1) TABLET DAILY. 90 tablet 3  . FLUoxetine (PROZAC) 20 MG capsule TAKE (1) CAPSULE DAILY. 90 capsule 3  . furosemide (LASIX) 80 MG tablet 120mg  (1.5 Tabs) in the AM, take 80mg  (1 Tab) in the PM. 75 tablet 3  . glucosamine-chondroitin 500-400 MG tablet Take 1 tablet by mouth 2 (two) times daily.     . Hypromellose (GENTEAL MILD) 0.2 % SOLN Place 1 drop into both eyes daily as needed (for dry eyes).     Andrew Kitchen losartan (COZAAR) 100 MG  tablet TAKE 1 TABLET ONCE DAILY. 90 tablet 3  . Multiple Vitamin (MULTIVITAMIN) tablet Take 1 tablet by mouth daily.    Andrew Kitchen NITROSTAT 0.4 MG SL tablet DISSOLVE 1 TABLET UNDER TONGUE AS NEEDED FOR CHEST PAIN,MAY REPEAT IN5 MINUTES FOR 2 DOSES. 10 tablet 1  . potassium chloride SA (K-DUR,KLOR-CON) 20 MEQ tablet TAKE 1 TABLET ONCE DAILY. 30 tablet 11  . tamsulosin (FLOMAX) 0.4 MG CAPS capsule Take 0.4 mg by mouth daily.    . temazepam (RESTORIL) 30 MG capsule TAKE 1 CAPSULE AT BEDTIME AS NEEDED FOR SLEEP. 30 capsule 3   No current facility-administered medications for this encounter.    BP 124/64   Pulse 82   Wt 190 lb 12.8 oz (86.5 kg)   SpO2 95%   BMI 30.80 kg/m   General: NAD. Ambulated in the clinic without difficulty Neck: JVP 8-9 cm. No thyromegaly or thyroid nodule.  Lungs: Clear to auscultation bilaterally with normal respiratory effort. CV: Nondisplaced PMI.  Heart irregular S1/S2, no S3/S4, 2/6 early SEM RUSB. 1+ edema to knees.  Right carotid bruit. Difficult to palpate pedal pulses.  Abdomen: Soft, nontender, non-distended, no hepatosplenomegaly.  Neurologic: Alert and oriented x 3.  Psych: Normal affect. Extremities: No clubbing or cyanosis. LUE sling.  Assessment/Plan:  1. CAD: s/p CABG.  Cath in 11/15 showed occluded LAD with left to right  collaterals.  The LAD was a relatively small vessel (super-dominant right).  Managed medically. He had chest pain in the setting of an admission to hospital in [place] with CHF exacerbation.  No chest pain since. Lexiscan Cardiolite in 6/16 with infarction but no ischemia.  - Continue ASA 81.   - Continue statin.  2. Ischemic cardiomyopathy with systolic CHF: EF 58-30% on 10/16 echo.  NYHA class II symptoms currently.  He has gained weight and has volume overload on exam.   - Continue current losartan 100 mg daily and Coreg 3.125 mg bid.  - Increase Lasix to 120 qam/80 qpm. BMET/BNP today and repeat BMET in 10 days. 3. Carotid stenosis: Stable, followed at [facility].  4. PAD: Stable claudication.  Most recent ABIs not markedly different from the past.    - Continue cilostazol, unable to come off due to severe calf pain when he stops it.  - Follows with [facility].  5. Hyperlipidemia: Check lipids today.  6. CKD III: BMET today. 7. Atrial fibrillation/flutter:  Paroxysmal, NSR today.  - Continue amiodarone 200 mg.  Check LFTs and TSH.  Will need to get regular eye exams. - Continue Eliquis 2.5 bid. CBC today.  8. Suspected OSA: He cancelled his sleep study due to mask discomfort 9. COPD: History of smoking. PFTs suggestive of emphysema.  10. CVA: Cerebellar, 10/16.  He has had some resulting imbalance.  He is no longer driving.   Followup in 1 month.     Loralie Champagne 11/28/2016

## 2016-11-28 NOTE — Patient Instructions (Signed)
Labs today (will call for abnormal results, otherwise no news is good news)  Labs in 10 Days  Increase lasix to 120 mg (1.5 Tab) in the AM, take 80mg  (1 Tab) in the PM.  Follow up in 1 month

## 2016-11-29 ENCOUNTER — Telehealth (HOSPITAL_COMMUNITY): Payer: Self-pay | Admitting: *Deleted

## 2016-11-29 DIAGNOSIS — R7989 Other specified abnormal findings of blood chemistry: Secondary | ICD-10-CM

## 2016-11-29 NOTE — Telephone Encounter (Signed)
-----   Message from Larey Dresser, MD sent at 11/28/2016  9:32 PM EST ----- Stable labs generally.  Needs free T3 and free T4 checked with high TSH.

## 2016-11-29 NOTE — Telephone Encounter (Signed)
Notes Recorded by Harvie Junior, CMA on 11/29/2016 at 2:10 PM EST Labs added to lab schedule on 12/14 ------  Notes Recorded by Larey Dresser, MD on 11/28/2016 at 9:32 PM EST Stable labs generally. Needs free T3 and free T4 checked with high TSH.    Ref Range & Units 1d ago 70mo ago   TSH 0.350 - 4.500 uIU/mL 10.504   8.650

## 2016-11-30 LAB — T4, FREE: FREE T4: 0.7 ng/dL (ref 0.61–1.12)

## 2016-12-01 LAB — T3, FREE: T3, Free: 2.5 pg/mL (ref 2.0–4.4)

## 2016-12-05 ENCOUNTER — Other Ambulatory Visit: Payer: Self-pay | Admitting: Internal Medicine

## 2016-12-08 ENCOUNTER — Other Ambulatory Visit (HOSPITAL_COMMUNITY): Payer: Self-pay | Admitting: *Deleted

## 2016-12-08 ENCOUNTER — Ambulatory Visit (HOSPITAL_COMMUNITY)
Admission: RE | Admit: 2016-12-08 | Discharge: 2016-12-08 | Disposition: A | Discharge status: Home / Self Care | Payer: Medicare Other | Source: Ambulatory Visit | Attending: Internal Medicine | Admitting: Internal Medicine

## 2016-12-08 DIAGNOSIS — R5383 Other fatigue: Secondary | ICD-10-CM | POA: Diagnosis present

## 2016-12-08 DIAGNOSIS — I5022 Chronic systolic (congestive) heart failure: Secondary | ICD-10-CM | POA: Diagnosis not present

## 2016-12-08 DIAGNOSIS — I5032 Chronic diastolic (congestive) heart failure: Secondary | ICD-10-CM

## 2016-12-08 LAB — TSH: TSH: 12.142 u[IU]/mL — ABNORMAL HIGH (ref 0.350–4.500)

## 2016-12-08 LAB — BASIC METABOLIC PANEL
ANION GAP: 7 (ref 5–15)
BUN: 38 mg/dL — ABNORMAL HIGH (ref 6–20)
CHLORIDE: 109 mmol/L (ref 101–111)
CO2: 26 mmol/L (ref 22–32)
Calcium: 9.3 mg/dL (ref 8.9–10.3)
Creatinine, Ser: 1.91 mg/dL — ABNORMAL HIGH (ref 0.61–1.24)
GFR calc Af Amer: 35 mL/min — ABNORMAL LOW (ref >60)
GFR, EST NON AFRICAN AMERICAN: 30 mL/min — AB (ref >60)
GLUCOSE: 142 mg/dL — AB (ref 65–99)
POTASSIUM: 4.5 mmol/L (ref 3.5–5.1)
Sodium: 142 mmol/L (ref 135–145)

## 2016-12-08 LAB — T4, FREE: Free T4: 0.65 ng/dL (ref 0.61–1.12)

## 2016-12-09 LAB — T3, FREE: T3 FREE: 2.5 pg/mL (ref 2.0–4.4)

## 2016-12-13 ENCOUNTER — Encounter: Payer: Self-pay | Admitting: Internal Medicine

## 2016-12-13 NOTE — Progress Notes (Signed)
Dematopathology report received from [facility].

## 2016-12-14 ENCOUNTER — Other Ambulatory Visit: Payer: Self-pay | Admitting: Cardiology

## 2016-12-22 ENCOUNTER — Other Ambulatory Visit: Payer: Self-pay | Admitting: Cardiology

## 2017-01-05 ENCOUNTER — Ambulatory Visit (HOSPITAL_COMMUNITY)
Admission: RE | Admit: 2017-01-05 | Discharge: 2017-01-05 | Disposition: A | Discharge status: Home / Self Care | Payer: Medicare Other | Source: Ambulatory Visit | Attending: Cardiology | Admitting: Cardiology

## 2017-01-05 ENCOUNTER — Encounter (HOSPITAL_COMMUNITY): Payer: Self-pay

## 2017-01-05 VITALS — BP 124/68 | HR 76 | Wt 184.5 lb

## 2017-01-05 DIAGNOSIS — K219 Gastro-esophageal reflux disease without esophagitis: Secondary | ICD-10-CM | POA: Diagnosis not present

## 2017-01-05 DIAGNOSIS — Z8673 Personal history of transient ischemic attack (TIA), and cerebral infarction without residual deficits: Secondary | ICD-10-CM | POA: Insufficient documentation

## 2017-01-05 DIAGNOSIS — E1151 Type 2 diabetes mellitus with diabetic peripheral angiopathy without gangrene: Secondary | ICD-10-CM | POA: Insufficient documentation

## 2017-01-05 DIAGNOSIS — I2510 Atherosclerotic heart disease of native coronary artery without angina pectoris: Secondary | ICD-10-CM | POA: Diagnosis not present

## 2017-01-05 DIAGNOSIS — Z87891 Personal history of nicotine dependence: Secondary | ICD-10-CM | POA: Diagnosis not present

## 2017-01-05 DIAGNOSIS — I252 Old myocardial infarction: Secondary | ICD-10-CM | POA: Insufficient documentation

## 2017-01-05 DIAGNOSIS — K589 Irritable bowel syndrome without diarrhea: Secondary | ICD-10-CM | POA: Insufficient documentation

## 2017-01-05 DIAGNOSIS — E039 Hypothyroidism, unspecified: Secondary | ICD-10-CM | POA: Diagnosis not present

## 2017-01-05 DIAGNOSIS — I6523 Occlusion and stenosis of bilateral carotid arteries: Secondary | ICD-10-CM | POA: Diagnosis not present

## 2017-01-05 DIAGNOSIS — Z7982 Long term (current) use of aspirin: Secondary | ICD-10-CM | POA: Insufficient documentation

## 2017-01-05 DIAGNOSIS — Z9889 Other specified postprocedural states: Secondary | ICD-10-CM | POA: Diagnosis not present

## 2017-01-05 DIAGNOSIS — I480 Paroxysmal atrial fibrillation: Secondary | ICD-10-CM

## 2017-01-05 DIAGNOSIS — N183 Chronic kidney disease, stage 3 (moderate): Secondary | ICD-10-CM | POA: Insufficient documentation

## 2017-01-05 DIAGNOSIS — Z951 Presence of aortocoronary bypass graft: Secondary | ICD-10-CM | POA: Insufficient documentation

## 2017-01-05 DIAGNOSIS — I739 Peripheral vascular disease, unspecified: Secondary | ICD-10-CM | POA: Insufficient documentation

## 2017-01-05 DIAGNOSIS — I70213 Atherosclerosis of native arteries of extremities with intermittent claudication, bilateral legs: Secondary | ICD-10-CM

## 2017-01-05 DIAGNOSIS — E1122 Type 2 diabetes mellitus with diabetic chronic kidney disease: Secondary | ICD-10-CM | POA: Diagnosis not present

## 2017-01-05 DIAGNOSIS — I255 Ischemic cardiomyopathy: Secondary | ICD-10-CM | POA: Diagnosis not present

## 2017-01-05 DIAGNOSIS — I4891 Unspecified atrial fibrillation: Secondary | ICD-10-CM | POA: Insufficient documentation

## 2017-01-05 DIAGNOSIS — J449 Chronic obstructive pulmonary disease, unspecified: Secondary | ICD-10-CM | POA: Insufficient documentation

## 2017-01-05 DIAGNOSIS — I4892 Unspecified atrial flutter: Secondary | ICD-10-CM | POA: Diagnosis not present

## 2017-01-05 DIAGNOSIS — Z85820 Personal history of malignant melanoma of skin: Secondary | ICD-10-CM | POA: Insufficient documentation

## 2017-01-05 DIAGNOSIS — I779 Disorder of arteries and arterioles, unspecified: Secondary | ICD-10-CM | POA: Diagnosis not present

## 2017-01-05 DIAGNOSIS — I5022 Chronic systolic (congestive) heart failure: Secondary | ICD-10-CM | POA: Insufficient documentation

## 2017-01-05 DIAGNOSIS — I5032 Chronic diastolic (congestive) heart failure: Secondary | ICD-10-CM | POA: Diagnosis not present

## 2017-01-05 DIAGNOSIS — E785 Hyperlipidemia, unspecified: Secondary | ICD-10-CM | POA: Diagnosis not present

## 2017-01-05 LAB — BASIC METABOLIC PANEL
ANION GAP: 9 (ref 5–15)
BUN: 36 mg/dL — AB (ref 6–20)
CHLORIDE: 103 mmol/L (ref 101–111)
CO2: 28 mmol/L (ref 22–32)
Calcium: 9.5 mg/dL (ref 8.9–10.3)
Creatinine, Ser: 1.93 mg/dL — ABNORMAL HIGH (ref 0.61–1.24)
GFR calc Af Amer: 35 mL/min — ABNORMAL LOW (ref >60)
GFR calc non Af Amer: 30 mL/min — ABNORMAL LOW (ref >60)
GLUCOSE: 116 mg/dL — AB (ref 65–99)
POTASSIUM: 4.4 mmol/L (ref 3.5–5.1)
Sodium: 140 mmol/L (ref 135–145)

## 2017-01-05 NOTE — Progress Notes (Signed)
Patient ID: Andrew [name], male   DOB: 05-28-1928, 83 y.o.   MRN: [id number] PCP: Dr. Ronnald Ramp Cardiology: Dr. [name]   83 yo with history of CKD, CAD s/p CABG, and ischemic cardiomyopathy with primarily diastolic CHF presents for cardiology followup.  He has a history of PAD followed at [facility].  I took him for [facility] in 11/15. 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  He had had occlusion of a relatively small LAD, there were right to left collaterals and no intervention was done (medical management).    He and his wife went on a cruise along the [employer] in 5/16.  He admits to considerable dietary indiscretion (high sodium diet).  It appears that he developed a hypertensive crisis along with chest pain while on the ship. He went into atrial fibrillation and developed CHF.  He was taken to the hospital in [place], [place].  He was in the ICU for about a week on Bipap intermittently.  Troponin peaked at 0.09 during this admission.  He was diuresed and after about 2 wks left the hospital and was able to fly home.    Cardiolite (6/16) showed EF 42%, prior anterolateral MI, no ischemia. Echo in 10/16 with EF 40-45%.    He was admitted on 07/06/15 for RLQ pain and fever.  He was treated for UTI and had a kidney stone as well.  He developed SOB after IVF were given for AKI.  He was treated for acute COPD exacerbation as well as CHF decompensation.  He was diuresed with IV lasix.  His weight on admission was 179 and got as high as 188 after IVF. Discharge weight was 181.  He was admitted in 10/16 with a cerebellar CVA.  He has some resultant imbalance.   He was again admitted in 11/16 with acute on chronic systolic CHF.  This was a short admission that appeared to be precipitated by a sodium load from eating at a [employer] .   No chest pain, palpitations, orthopnea/PND.  He is able to walk about 1 block, then has bilateral calf pain. This is stable. He is not short of breath after walking a  block.  Ok walking up a flight of steps.  Weight is down 6 lbs on higher Lasix dose.   ECG: NSR, 1st degree AVB, left axis deviation, poor RWP  Labs (12/14): K 4.8, creatinine 1.5 Labs (5/15): LDL 92 Labs (11/15): K 4.1, creatinine 1.5 Labs (12/15): K 4, creatinine 1.5 Labs (1/16): LFTs normal, LDL 51, HDL 47 Labs (5/16): TnI 0.09 Labs (6/16): K 4.8 => 4.4, creatinine 1.79 => 2.26, HCT 28.7 Labs (7/16) K 4.2, creatinine 1.98, LFTs normal, TSH normal Labs (8/16): HCT 27.1 Labs (9/16): hgb 8.1, K 4.3, creatinine 1.8, LFTs normal, TSH elevated Labs (10/16): LDL 74, HDL 45, TSH 5.19 (increased), free T3 and free T4 normal.  Labs (11/16): K 3.5, creatinine 1.47, AST 84, ALT 89 Labs (12/16): K 4.3, creatinine 1.58, AST 43, ALT 42 Labs (3/17): K 4.2, creatinine 1.4, hgb 10.7, AST 36, ALT 45 Labs (6/17): K 4, creatinine 1.69, hgb 10.9, proBNP 389, TSH mildly elevated Labs (8/17): Free T4 and T3 normal, TSH mildly elevated, K 4.2, creatinine 1.7, LFTs normal, hgb 10.4 Labs (12/17): TSH elevated but free T3 and T4 normal, K 4.5, creatinine 1.9, LDL 45, HDL 39, LFTs normal, HCT 32.9  PMH: 1. CKD 2. HTN 3. Hyperlipidemia 4. AAA: s/p surgery with left renal artery bypass in 1997  5. GERD 6. IBS 7. Melanoma s/p reception 8. Diabetes: Diet-controlled 9. Carotid stenosis: Carotid dopplers (4/15) with 40-59% bilateral stenosis. Carotid dopplers (7/16) with < 40% BICA stenosis.  Carotid dopplers (10/16) with 40-59% BICA stenosis.  - Carotids (8/17) with minimal disease.  10. PAD: 9/14 ABIs 0.91 right 1.09 left.  7/16 ABIs 0.86 right, 1.1 left; aortoiliac duplex with < 50% bilateral iliac stenosis.  - Aortoiliac duplex (8/17) with < 50% bilateral iliac stenosis.   - ABIs (10/17): left 0.91, right 0.98, left TBI 0.44 (abnormal).  11. CAD: Has super-dominant RCA. s/p CABG 1992.  He had Taxus DES to mid PDA in 5/02. LHC (1/05) with 90% ostial PDA (had DES to this vessel), totally occluded RIMA-PDA,  totally occluded PLV, sequential SVG-PLV with 1 branch occluded.  LHC (11/15) with left main off the right cusp, 40-50% ostial left main, total occlusion of the proximal LAD (small vessel) with right to left collaterals, large super-dominant RCA with 50% ISR mid RCA, 50% ostial PDA, patent SVG-PLV, RIMA-PDA known atretic. Lexiscan Cardiolite (6/16) with EF 42%, prior anterolateral MI, no ischemia.  12. Ischemic cardiomyopathy: Echo (12/09) with EF 45-50%, basal to mid inferolateral hypokinesis. Echo (6/15) with EF 45-50%, akinesis of the mid to apical inferolateral wall.  Echo (6/16) with EF 50-55%, mild LVH, inferior hypokinesis, mild MR.  Echo (10/16) with EF 40-45%, moderate LVH, mildly decreased RV systolic function.  13. OA 14. Atrial fibrillation/flutter: Paroxysmal.  Noted during 5/16 hospitalization in [place] and in 6/16.  He developed atrial flutter in 8/16, back in NSR by 9/16.  15. Emphysema: PFTs (8/16) with FVC 88%, FEV1 74%, ratio 81%, TLC 83%, DLCO 31%.  16. Renal cysts 17. Idiopathic peripheral neuropathy.  18. Anemia.  45. 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  FH: Brother with CAD.   ROS: All systems reviewed and negative except as per HPI.   Current Outpatient Prescriptions  Medication Sig Dispense Refill  . ACCU-CHEK AVIVA PLUS test strip CHECK BLOOD SUGAR 4 TIMES A DAY. 100 each 11  . acetaminophen (TYLENOL) 500 MG tablet Take 1,000 mg by mouth every 8 (eight) hours as needed for moderate pain.     Marland Kitchen ALPRAZolam (XANAX) 0.5 MG tablet TAKE 1/2 TABLET TWICE DAILY AS NEEDED FOR ANXIETY. 60 tablet 5  . amiodarone (PACERONE) 200 MG tablet TAKE 1 TABLET ONCE DAILY. 30 tablet 6  . aspirin EC 81 MG tablet Take 1 tablet (81 mg total) by mouth daily.    Marland Kitchen atorvastatin (LIPITOR) 80 MG tablet TAKE 1 TABLET ONCE DAILY. 90 tablet 3  . carvedilol (COREG) 3.125 MG tablet Take 1 tablet (3.125 mg total) by  mouth 2 (two) times daily. 60 tablet 3  . cilostazol (PLETAL) 100 MG tablet TAKE 1 TABLET TWICE DAILY. 180 tablet 3  . ELIQUIS 2.5 MG TABS tablet TAKE 1 TABLET TWICE DAILY. 60 tablet 0  . erythromycin with ethanol (THERAMYCIN) 2 % external solution Apply 1 application topically daily as needed (for skin irritation around neck).    . ezetimibe (ZETIA) 10 MG tablet TAKE (1) TABLET DAILY. 90 tablet 3  . FLUoxetine (PROZAC) 20 MG capsule TAKE (1) CAPSULE DAILY. 90 capsule 3  . furosemide (LASIX) 80 MG tablet 120mg  (1.5 Tabs) in the AM, take 80mg  (1 Tab) in the PM. 75 tablet 3  . glucosamine-chondroitin 500-400 MG tablet Take 1 tablet by mouth 2 (two) times daily.     Marland Kitchen  Hypromellose (GENTEAL MILD) 0.2 % SOLN Place 1 drop into both eyes daily as needed (for dry eyes).     Marland Kitchen losartan (COZAAR) 100 MG tablet TAKE 1 TABLET ONCE DAILY. 90 tablet 3  . Multiple Vitamin (MULTIVITAMIN) tablet Take 1 tablet by mouth daily.    Marland Kitchen NIFEdipine (PROCARDIA-XL/ADALAT CC) 30 MG 24 hr tablet TAKE 1 TABLET ONCE DAILY. 90 tablet 3  . potassium chloride SA (K-DUR,KLOR-CON) 20 MEQ tablet TAKE 1 TABLET ONCE DAILY. 30 tablet 11  . tamsulosin (FLOMAX) 0.4 MG CAPS capsule Take 0.4 mg by mouth daily.    . temazepam (RESTORIL) 30 MG capsule TAKE 1 CAPSULE AT BEDTIME AS NEEDED FOR SLEEP. 30 capsule 3  . NITROSTAT 0.4 MG SL tablet DISSOLVE 1 TABLET UNDER TONGUE AS NEEDED FOR CHEST PAIN,MAY REPEAT IN5 MINUTES FOR 2 DOSES. (Patient not taking: Reported on 01/05/2017) 10 tablet 1   No current facility-administered medications for this encounter.    BP 124/68   Pulse 76   Wt 184 lb 8 oz (83.7 kg)   SpO2 97%   BMI 29.78 kg/m   General: NAD. Ambulated in the clinic without difficulty Neck: JVP not elevated. No thyromegaly or thyroid nodule.  Lungs: Clear to auscultation bilaterally with normal respiratory effort. CV: Nondisplaced PMI.  Heart irregular S1/S2, no S3/S4, 2/6 early SEM RUSB. No edema.  Right carotid bruit. Difficult to  palpate pedal pulses.  Abdomen: Soft, nontender, non-distended, no hepatosplenomegaly.  Neurologic: Alert and oriented x 3.  Psych: Normal affect. Extremities: No clubbing or cyanosis.  Assessment/Plan:  1. CAD: s/p CABG.  Cath in 11/15 showed occluded LAD with left to right collaterals.  The LAD was a relatively small vessel (super-dominant right).  Managed medically. He had chest pain in the setting of an admission to hospital in [place] with CHF exacerbation.  No chest pain since. Lexiscan Cardiolite in 6/16 with infarction but no ischemia.  - Continue ASA 81.   - Continue statin.  2. Ischemic cardiomyopathy with systolic CHF: EF 09-38% on 10/16 echo.  NYHA class II symptoms currently.  Volume status looks better on higher Lasix.    - Continue current losartan 100 mg daily and Coreg 3.125 mg bid.  - Continue Lasix 120 qam/80 qpm. BMET today. 3. Carotid stenosis: Stable, followed at [facility].  4. PAD: Stable claudication.  Most recent ABIs not markedly different from the past.    - Continue cilostazol, unable to come off due to severe calf pain when he stops it.  - Follows with [facility].  5. Hyperlipidemia: Good lipids recently.  6. CKD III: BMET today. 7. Atrial fibrillation/flutter:  Paroxysmal, NSR today.  - Continue amiodarone 200 mg.  Recent LFTs normal.  Has subclinical hypothyroidism.  Will need to get regular eye exams. - Continue Eliquis 2.5 bid. CBC today.  8. Suspected OSA: He cancelled his sleep study due to mask discomfort 9. COPD: History of smoking. PFTs suggestive of emphysema.  10. CVA: Cerebellar, 10/16.  He has had some resulting imbalance.  He is no longer driving.  11. Hypertension: BP controlled.  He is on nifedipine XL.  I did not realize he was taking this but he says he has been on it for "years."  I asked him to bring all his meds to next appt to make sure we have an accurate list.   Loralie Champagne 01/05/2017

## 2017-01-05 NOTE — Patient Instructions (Signed)
Labs today (will call for abnormal results, otherwise no news is good news)  PLEASE bring all medications at your next appointment with [place]  Follow up in 3 months

## 2017-01-10 ENCOUNTER — Encounter: Payer: Self-pay | Admitting: Podiatry

## 2017-01-10 ENCOUNTER — Ambulatory Visit (INDEPENDENT_AMBULATORY_CARE_PROVIDER_SITE_OTHER): Payer: Medicare Other | Admitting: Podiatry

## 2017-01-10 DIAGNOSIS — B351 Tinea unguium: Secondary | ICD-10-CM

## 2017-01-10 DIAGNOSIS — M79676 Pain in unspecified toe(s): Secondary | ICD-10-CM | POA: Diagnosis not present

## 2017-01-10 DIAGNOSIS — I739 Peripheral vascular disease, unspecified: Secondary | ICD-10-CM

## 2017-01-10 NOTE — Progress Notes (Signed)
Patient ID: Andrew [name], male   DOB: 12/24/1928, 83 y.o.   MRN: [id number] Complaint:  Visit Type: Patient returns to my office for continued preventative foot care services. Complaint: Patient states" my nails have grown long and thick and become painful to walk and wear shoes" Patient has been diagnosed with DM with no foot complications. The patient presents for preventative foot care services. No changes to ROS  Podiatric Exam: Vascular: dorsalis pedis and posterior tibial pulses are palpable bilateral. Capillary return is immediate. Temperature gradient is WNL. Skin turgor WNL  Sensorium: Normal Semmes Weinstein monofilament test. Normal tactile sensation bilaterally. Nail Exam: Pt has thick disfigured discolored nails with subungual debris noted bilateral entire nail hallux through fifth toenails Ulcer Exam: There is no evidence of ulcer or pre-ulcerative changes or infection. Orthopedic Exam: Muscle tone and strength are WNL. No limitations in general ROM. No crepitus or effusions noted. Foot type and digits show no abnormalities. Bony prominences are unremarkable. 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 Skin: No Porokeratosis. No infection or ulcers  Diagnosis:  Onychomycosis, , Pain in right toe, pain in left toes  Treatment & Plan Procedures and Treatment: Consent by patient was obtained for treatment procedures. The patient understood the discussion of treatment and procedures well. All questions were answered thoroughly reviewed. Debridement of mycotic and hypertrophic toenails, 1 through 5 bilateral and clearing of subungual debris. No ulceration, no infection noted.  Return Visit-Office Procedure: Patient instructed to return to the office for a follow up visit 3 months for continued evaluation and treatment.   Gardiner Barefoot DPM

## 2017-01-15 ENCOUNTER — Other Ambulatory Visit: Payer: Self-pay | Admitting: Cardiology

## 2017-01-25 DIAGNOSIS — Z006 Encounter for examination for normal comparison and control in clinical research program: Secondary | ICD-10-CM

## 2017-01-30 NOTE — Progress Notes (Signed)
Patient present for [facility] HF Study visit 7. States he has been doing well. No use of nitroglycerin or ACS symptoms. States he did have excess water weight for which Dr. [name] increased his diuretic for a few days; weight returned to normal and he has been well. Verbalizes compliance with study medications and has no questions regarding study. Study questionnaires completed, labs drawn. Study IP dispensed. Patient states he left one bottle of IP at home, returned 3 of 4. Will call patient to schedule next appt. Very pleasant.

## 2017-02-02 ENCOUNTER — Encounter: Payer: Self-pay | Admitting: Gastroenterology

## 2017-02-23 ENCOUNTER — Ambulatory Visit (HOSPITAL_COMMUNITY)
Admission: RE | Admit: 2017-02-23 | Discharge: 2017-02-23 | Disposition: A | Discharge status: Home / Self Care | Payer: Medicare Other | Source: Ambulatory Visit | Attending: Cardiology | Admitting: Cardiology

## 2017-02-23 ENCOUNTER — Encounter (HOSPITAL_COMMUNITY): Payer: Self-pay

## 2017-02-23 VITALS — BP 124/63 | HR 87 | Wt 189.5 lb

## 2017-02-23 DIAGNOSIS — N183 Chronic kidney disease, stage 3 (moderate): Secondary | ICD-10-CM | POA: Insufficient documentation

## 2017-02-23 DIAGNOSIS — Z7982 Long term (current) use of aspirin: Secondary | ICD-10-CM | POA: Insufficient documentation

## 2017-02-23 DIAGNOSIS — I252 Old myocardial infarction: Secondary | ICD-10-CM | POA: Diagnosis not present

## 2017-02-23 DIAGNOSIS — Z8673 Personal history of transient ischemic attack (TIA), and cerebral infarction without residual deficits: Secondary | ICD-10-CM | POA: Diagnosis not present

## 2017-02-23 DIAGNOSIS — I5022 Chronic systolic (congestive) heart failure: Secondary | ICD-10-CM | POA: Insufficient documentation

## 2017-02-23 DIAGNOSIS — Z9889 Other specified postprocedural states: Secondary | ICD-10-CM | POA: Diagnosis not present

## 2017-02-23 DIAGNOSIS — E1122 Type 2 diabetes mellitus with diabetic chronic kidney disease: Secondary | ICD-10-CM | POA: Diagnosis not present

## 2017-02-23 DIAGNOSIS — R079 Chest pain, unspecified: Secondary | ICD-10-CM | POA: Diagnosis not present

## 2017-02-23 DIAGNOSIS — K589 Irritable bowel syndrome without diarrhea: Secondary | ICD-10-CM | POA: Diagnosis not present

## 2017-02-23 DIAGNOSIS — I4891 Unspecified atrial fibrillation: Secondary | ICD-10-CM | POA: Insufficient documentation

## 2017-02-23 DIAGNOSIS — I255 Ischemic cardiomyopathy: Secondary | ICD-10-CM | POA: Insufficient documentation

## 2017-02-23 DIAGNOSIS — I2510 Atherosclerotic heart disease of native coronary artery without angina pectoris: Secondary | ICD-10-CM | POA: Diagnosis not present

## 2017-02-23 DIAGNOSIS — J439 Emphysema, unspecified: Secondary | ICD-10-CM | POA: Diagnosis not present

## 2017-02-23 DIAGNOSIS — I4892 Unspecified atrial flutter: Secondary | ICD-10-CM | POA: Insufficient documentation

## 2017-02-23 DIAGNOSIS — I130 Hypertensive heart and chronic kidney disease with heart failure and stage 1 through stage 4 chronic kidney disease, or unspecified chronic kidney disease: Secondary | ICD-10-CM | POA: Diagnosis not present

## 2017-02-23 DIAGNOSIS — Z87891 Personal history of nicotine dependence: Secondary | ICD-10-CM | POA: Insufficient documentation

## 2017-02-23 DIAGNOSIS — I70213 Atherosclerosis of native arteries of extremities with intermittent claudication, bilateral legs: Secondary | ICD-10-CM

## 2017-02-23 DIAGNOSIS — N189 Chronic kidney disease, unspecified: Secondary | ICD-10-CM | POA: Insufficient documentation

## 2017-02-23 DIAGNOSIS — K219 Gastro-esophageal reflux disease without esophagitis: Secondary | ICD-10-CM | POA: Insufficient documentation

## 2017-02-23 DIAGNOSIS — I480 Paroxysmal atrial fibrillation: Secondary | ICD-10-CM

## 2017-02-23 DIAGNOSIS — Z951 Presence of aortocoronary bypass graft: Secondary | ICD-10-CM | POA: Diagnosis not present

## 2017-02-23 DIAGNOSIS — Z85820 Personal history of malignant melanoma of skin: Secondary | ICD-10-CM | POA: Diagnosis not present

## 2017-02-23 DIAGNOSIS — Z7901 Long term (current) use of anticoagulants: Secondary | ICD-10-CM | POA: Diagnosis not present

## 2017-02-23 DIAGNOSIS — I6523 Occlusion and stenosis of bilateral carotid arteries: Secondary | ICD-10-CM | POA: Diagnosis not present

## 2017-02-23 DIAGNOSIS — E785 Hyperlipidemia, unspecified: Secondary | ICD-10-CM | POA: Insufficient documentation

## 2017-02-23 LAB — LIPID PANEL
Cholesterol: 139 mg/dL (ref 0–200)
HDL: 49 mg/dL (ref >40)
LDL CALC: 75 mg/dL (ref 0–99)
Total CHOL/HDL Ratio: 2.8 RATIO
Triglycerides: 77 mg/dL (ref <150)
VLDL: 15 mg/dL (ref 0–40)

## 2017-02-23 LAB — COMPREHENSIVE METABOLIC PANEL
ALK PHOS: 68 U/L (ref 38–126)
ALT: 49 U/L (ref 17–63)
ANION GAP: 8 (ref 5–15)
AST: 33 U/L (ref 15–41)
Albumin: 3.6 g/dL (ref 3.5–5.0)
BUN: 34 mg/dL — ABNORMAL HIGH (ref 6–20)
CALCIUM: 9.4 mg/dL (ref 8.9–10.3)
CO2: 29 mmol/L (ref 22–32)
CREATININE: 1.87 mg/dL — AB (ref 0.61–1.24)
Chloride: 102 mmol/L (ref 101–111)
GFR, EST AFRICAN AMERICAN: 36 mL/min — AB (ref >60)
GFR, EST NON AFRICAN AMERICAN: 31 mL/min — AB (ref >60)
Glucose, Bld: 104 mg/dL — ABNORMAL HIGH (ref 65–99)
Potassium: 4.3 mmol/L (ref 3.5–5.1)
SODIUM: 139 mmol/L (ref 135–145)
TOTAL PROTEIN: 6.9 g/dL (ref 6.5–8.1)
Total Bilirubin: 0.7 mg/dL (ref 0.3–1.2)

## 2017-02-23 LAB — BRAIN NATRIURETIC PEPTIDE: B Natriuretic Peptide: 187.4 pg/mL — ABNORMAL HIGH (ref 0.0–100.0)

## 2017-02-23 LAB — CBC
HEMATOCRIT: 33.7 % — AB (ref 39.0–52.0)
Hemoglobin: 11.1 g/dL — ABNORMAL LOW (ref 13.0–17.0)
MCH: 32.5 pg (ref 26.0–34.0)
MCHC: 32.9 g/dL (ref 30.0–36.0)
MCV: 98.5 fL (ref 78.0–100.0)
Platelets: 174 10*3/uL (ref 150–400)
RBC: 3.42 MIL/uL — AB (ref 4.22–5.81)
RDW: 13.5 % (ref 11.5–15.5)
WBC: 9 10*3/uL (ref 4.0–10.5)

## 2017-02-23 NOTE — Patient Instructions (Signed)
Increase Furosemide to 120 mg (1 & 1/2 tabs) Twice daily FOR 5 DAYS ONLY, then back to 120 mg in AM and 80 mg in PM  Labs today  Your physician has requested that you have an echocardiogram. Echocardiography is a painless test that uses sound waves to create images of your heart. It provides your doctor with information about the size and shape of your heart and how well your heart's chambers and valves are working. This procedure takes approximately one hour. There are no restrictions for this procedure.  Your physician has requested that you have a lexiscan myoview. For further information please visit [email or web address]. Please follow instruction sheet, as given.  Your physician recommends that you schedule a follow-up appointment in: 1 month

## 2017-02-23 NOTE — Progress Notes (Signed)
Patient ID: Andrew [name], male   DOB: [birth year], 83 y.o.   MRN: [id number] PCP: Dr. Ronnald Ramp Cardiology: Dr. [name]   83 yo with history of CKD, CAD s/p CABG, and ischemic cardiomyopathy with primarily diastolic CHF presents for cardiology followup.  He has a history of PAD followed at [facility].  I took him for [facility] in 11/15. 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  He had had occlusion of a relatively small LAD, there were right to left collaterals and no intervention was done (medical management).    He and his wife went on a cruise along the [employer] in 5/16.  He admits to considerable dietary indiscretion (high sodium diet).  It appears that he developed a hypertensive crisis along with chest pain while on the ship. He went into atrial fibrillation and developed CHF.  He was taken to the hospital in [place], [place].  He was in the ICU for about a week on Bipap intermittently.  Troponin peaked at 0.09 during this admission.  He was diuresed and after about 2 wks left the hospital and was able to fly home.    Cardiolite (6/16) showed EF 42%, prior anterolateral MI, no ischemia. Echo in 10/16 with EF 40-45%.    He was admitted on 07/06/15 for RLQ pain and fever.  He was treated for UTI and had a kidney stone as well.  He developed SOB after IVF were given for AKI.  He was treated for acute COPD exacerbation as well as CHF decompensation.  He was diuresed with IV lasix.  His weight on admission was 179 and got as high as 188 after IVF. Discharge weight was 181.  He was admitted in 10/16 with a cerebellar CVA.  He has some resultant imbalance.   He was again admitted in 11/16 with acute on chronic systolic CHF.  This was a short admission that appeared to be precipitated by a sodium load from eating at a [employer] .   He recently went on a cruise to the [place].  He was not watching his diet on the cruise and his weight went up 11 lbs.  He noted dyspnea after walking about 1 block.  He  still has his chronic claudication in the calves after walking about a block as well.  He started to notice central chest tightness with moderate exertion (such as hoisting himself up on his bunk), but not with walking, while on his cruise.  He took NTG x 1 but says that it really did not seem to help (pain resolved on its own).  Since he has been back from this cruise, his weight has started to trend down and he has had no more chest pain.    ECG (personally reviewed): NSR, 1st degree AVB, IVCD 130 msec, poor RWP  Labs (12/14): K 4.8, creatinine 1.5 Labs (5/15): LDL 92 Labs (11/15): K 4.1, creatinine 1.5 Labs (12/15): K 4, creatinine 1.5 Labs (1/16): LFTs normal, LDL 51, HDL 47 Labs (5/16): TnI 0.09 Labs (6/16): K 4.8 => 4.4, creatinine 1.79 => 2.26, HCT 28.7 Labs (7/16) K 4.2, creatinine 1.98, LFTs normal, TSH normal Labs (8/16): HCT 27.1 Labs (9/16): hgb 8.1, K 4.3, creatinine 1.8, LFTs normal, TSH elevated Labs (10/16): LDL 74, HDL 45, TSH 5.19 (increased), free T3 and free T4 normal.  Labs (11/16): K 3.5, creatinine 1.47, AST 84, ALT 89 Labs (12/16): K 4.3, creatinine 1.58, AST 43, ALT 42 Labs (3/17): K 4.2, creatinine 1.4, hgb  10.7, AST 36, ALT 45 Labs (6/17): K 4, creatinine 1.69, hgb 10.9, proBNP 389, TSH mildly elevated Labs (8/17): Free T4 and T3 normal, TSH mildly elevated, K 4.2, creatinine 1.7, LFTs normal, hgb 10.4 Labs (12/17): TSH elevated but free T3 and T4 normal, K 4.5, creatinine 1.9, LDL 45, HDL 39, LFTs normal, HCT 32.9 Labs (1/18): K 4.4, creatinine 1.93  PMH: 1. CKD 2. HTN 3. Hyperlipidemia 4. AAA: s/p surgery with left renal artery bypass in 1997 5. GERD 6. IBS 7. Melanoma s/p reception 8. Diabetes: Diet-controlled 9. Carotid stenosis: Carotid dopplers (4/15) with 40-59% bilateral stenosis. Carotid dopplers (7/16) with < 40% BICA stenosis.  Carotid dopplers (10/16) with 40-59% BICA stenosis.  - Carotids (8/17) with minimal disease.  10. PAD: 9/14 ABIs 0.91  right 1.09 left.  7/16 ABIs 0.86 right, 1.1 left; aortoiliac duplex with < 50% bilateral iliac stenosis.  - Aortoiliac duplex (8/17) with < 50% bilateral iliac stenosis.   - ABIs (10/17): left 0.91, right 0.98, left TBI 0.44 (abnormal).  11. CAD: Has super-dominant RCA. s/p CABG 1992.  He had Taxus DES to mid PDA in 5/02. LHC (1/05) with 90% ostial PDA (had DES to this vessel), totally occluded RIMA-PDA, totally occluded PLV, sequential SVG-PLV with 1 branch occluded.  LHC (11/15) with left main off the right cusp, 40-50% ostial left main, total occlusion of the proximal LAD (small vessel) with right to left collaterals, large super-dominant RCA with 50% ISR mid RCA, 50% ostial PDA, patent SVG-PLV, RIMA-PDA known atretic. Lexiscan Cardiolite (6/16) with EF 42%, prior anterolateral MI, no ischemia.  12. Ischemic cardiomyopathy: Echo (12/09) with EF 45-50%, basal to mid inferolateral hypokinesis. Echo (6/15) with EF 45-50%, akinesis of the mid to apical inferolateral wall.  Echo (6/16) with EF 50-55%, mild LVH, inferior hypokinesis, mild MR.  Echo (10/16) with EF 40-45%, moderate LVH, mildly decreased RV systolic function.  13. OA 14. Atrial fibrillation/flutter: Paroxysmal.  Noted during 5/16 hospitalization in [place] and in 6/16.  He developed atrial flutter in 8/16, back in NSR by 9/16.  15. Emphysema: PFTs (8/16) with FVC 88%, FEV1 74%, ratio 81%, TLC 83%, DLCO 31%.  16. Renal cysts 17. Idiopathic peripheral neuropathy.  18. Anemia.  28. 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  FH: Brother with CAD.   ROS: All systems reviewed and negative except as per HPI.   Current Outpatient Prescriptions  Medication Sig Dispense Refill  . ACCU-CHEK AVIVA PLUS test strip CHECK BLOOD SUGAR 4 TIMES A DAY. 100 each 11  . acetaminophen (TYLENOL) 500 MG tablet Take 1,000 mg by mouth every 8 (eight) hours as needed for moderate pain.      Marland Kitchen ALPRAZolam (XANAX) 0.5 MG tablet TAKE 1/2 TABLET TWICE DAILY AS NEEDED FOR ANXIETY. 60 tablet 5  . amiodarone (PACERONE) 200 MG tablet TAKE 1 TABLET ONCE DAILY. 30 tablet 6  . aspirin EC 81 MG tablet Take 1 tablet (81 mg total) by mouth daily.    Marland Kitchen atorvastatin (LIPITOR) 80 MG tablet TAKE 1 TABLET ONCE DAILY. 90 tablet 3  . carvedilol (COREG) 3.125 MG tablet Take 1 tablet (3.125 mg total) by mouth 2 (two) times daily. 60 tablet 3  . cilostazol (PLETAL) 100 MG tablet TAKE 1 TABLET TWICE DAILY. 180 tablet 3  . ELIQUIS 2.5 MG TABS tablet TAKE 1 TABLET TWICE DAILY. 60 tablet 5  . erythromycin with ethanol (THERAMYCIN) 2 % external solution Apply  1 application topically daily as needed (for skin irritation around neck).    . ezetimibe (ZETIA) 10 MG tablet TAKE (1) TABLET DAILY. 90 tablet 3  . FLUoxetine (PROZAC) 20 MG capsule TAKE (1) CAPSULE DAILY. 90 capsule 3  . furosemide (LASIX) 80 MG tablet 120mg  (1.5 Tabs) in the AM, take 80mg  (1 Tab) in the PM. 75 tablet 3  . glucosamine-chondroitin 500-400 MG tablet Take 1 tablet by mouth 2 (two) times daily.     . Hypromellose (GENTEAL MILD) 0.2 % SOLN Place 1 drop into both eyes daily as needed (for dry eyes).     Marland Kitchen losartan (COZAAR) 100 MG tablet TAKE 1 TABLET ONCE DAILY. 90 tablet 3  . Multiple Vitamin (MULTIVITAMIN) tablet Take 1 tablet by mouth daily.    Marland Kitchen NIFEdipine (PROCARDIA-XL/ADALAT CC) 30 MG 24 hr tablet TAKE 1 TABLET ONCE DAILY. 90 tablet 3  . potassium chloride SA (K-DUR,KLOR-CON) 20 MEQ tablet TAKE 1 TABLET ONCE DAILY. 30 tablet 11  . tamsulosin (FLOMAX) 0.4 MG CAPS capsule Take 0.4 mg by mouth daily.    Marland Kitchen NITROSTAT 0.4 MG SL tablet DISSOLVE 1 TABLET UNDER TONGUE AS NEEDED FOR CHEST PAIN,MAY REPEAT IN5 MINUTES FOR 2 DOSES. (Patient not taking: Reported on 02/23/2017) 10 tablet 1  . temazepam (RESTORIL) 30 MG capsule TAKE 1 CAPSULE AT BEDTIME AS NEEDED FOR SLEEP. (Patient not taking: Reported on 02/23/2017) 30 capsule 3   No current  facility-administered medications for this encounter.    BP 124/63   Pulse 87   Wt 189 lb 8 oz (86 kg)   SpO2 95%   BMI 30.59 kg/m   General: NAD. Ambulated in the clinic without difficulty Neck: JVP 8-9 cm with HJR. No thyromegaly or thyroid nodule.  Lungs: Occasional rhonchi.  CV: Nondisplaced PMI.  Heart regular S1/S2, no S3/S4, 2/6 early SEM RUSB. 1+ edema 3/4 to knees bilaterally.  Right carotid bruit. Difficult to palpate pedal pulses.  Abdomen: Soft, nontender, non-distended, no hepatosplenomegaly.  Neurologic: Alert and oriented x 3.  Psych: Normal affect. Extremities: No clubbing or cyanosis.  Assessment/Plan:  1. CAD: s/p CABG.  Cath in 11/15 showed occluded LAD with left to right collaterals.  The LAD was a relatively small vessel (super-dominant right).  Managed medically. Lexiscan Cardiolite in 6/16 with infarction but no ischemia.  In the past, he has tended to get chest heaviness in association with CHF exacerbation.  He noted chest heaviness on recent cruise when weight went up 11 lbs.  No chest pain since he has been back from cruise and weight has been going down.  - Continue ASA 81.   - Continue statin. - Continue Coreg.   - I will arrange for Lexiscan Cardiolite to make sure that he does not have a new large area of ischemia.  Would have high threshold for cath given CKD.  2. Ischemic cardiomyopathy with systolic CHF: EF 14-78% on 10/16 echo.  NYHA class III symptoms currently.  He is volume overloaded, likely due to dietary indiscretion on recent cruise.  He is trying to cut back on sodium now and weight has started to trend down.     - Continue current losartan 100 mg daily and Coreg 3.125 mg bid.  - Increase Lasix to 120 mg bid x 5 days then decreased back to 120 qam/80 qpm.  BMET/BNP today.  - I am going to arrange an echocardiogram.  3. Carotid stenosis: Stable, followed at [facility].  4. PAD: Stable claudication.  Most recent ABIs  not markedly different from the past.     - Continue cilostazol, unable to come off due to severe calf pain when he stops it.  - Follows with [facility].  5. Hyperlipidemia: Check lipids today.  6. CKD III: BMET today. 7. Atrial fibrillation/flutter:  Paroxysmal, NSR today.  - Continue amiodarone 200 mg.  Check LFTs.  Had TSH recently.  Will need to get regular eye exams. - Continue Eliquis 2.5 bid. CBC today.  8. Suspected OSA: He cancelled his sleep study due to mask discomfort 9. COPD: History of smoking. PFTs suggestive of emphysema.  10. CVA: Cerebellar, 10/16.  He has had some resulting imbalance.  He is no longer driving.  11. Hypertension: BP controlled.    Loralie Champagne 02/23/2017

## 2017-03-02 ENCOUNTER — Other Ambulatory Visit (HOSPITAL_COMMUNITY): Payer: Self-pay | Admitting: Cardiology

## 2017-03-08 ENCOUNTER — Telehealth (HOSPITAL_COMMUNITY): Payer: Self-pay | Admitting: *Deleted

## 2017-03-08 NOTE — Telephone Encounter (Signed)
Patient given detailed instructions per Myocardial Perfusion Study Information Sheet for the test on 03/13/17 at 1000. Patient notified to arrive 15 minutes early and that it is imperative to arrive on time for appointment to keep from having the test rescheduled.  If you need to cancel or reschedule your appointment, please call the office within 24 hours of your appointment. Failure to do so may result in a cancellation of your appointment, and a $50 no show fee. Patient verbalized understanding.Buffi Ewton, Ranae Palms

## 2017-03-13 ENCOUNTER — Ambulatory Visit (HOSPITAL_COMMUNITY): Payer: Medicare Other | Attending: Cardiovascular Disease

## 2017-03-13 ENCOUNTER — Other Ambulatory Visit: Payer: Self-pay

## 2017-03-13 ENCOUNTER — Ambulatory Visit (HOSPITAL_BASED_OUTPATIENT_CLINIC_OR_DEPARTMENT_OTHER): Payer: Medicare Other

## 2017-03-13 DIAGNOSIS — N189 Chronic kidney disease, unspecified: Secondary | ICD-10-CM | POA: Insufficient documentation

## 2017-03-13 DIAGNOSIS — I5022 Chronic systolic (congestive) heart failure: Secondary | ICD-10-CM | POA: Insufficient documentation

## 2017-03-13 DIAGNOSIS — I2510 Atherosclerotic heart disease of native coronary artery without angina pectoris: Secondary | ICD-10-CM | POA: Insufficient documentation

## 2017-03-13 DIAGNOSIS — Z951 Presence of aortocoronary bypass graft: Secondary | ICD-10-CM | POA: Diagnosis not present

## 2017-03-13 DIAGNOSIS — I4891 Unspecified atrial fibrillation: Secondary | ICD-10-CM | POA: Insufficient documentation

## 2017-03-13 DIAGNOSIS — R079 Chest pain, unspecified: Secondary | ICD-10-CM | POA: Diagnosis not present

## 2017-03-13 LAB — MYOCARDIAL PERFUSION IMAGING
CHL CUP NUCLEAR SDS: 1
CHL CUP RESTING HR STRESS: 73 {beats}/min
LHR: 0.35
LV dias vol: 156 mL (ref 62–150)
LVSYSVOL: 76 mL
NUC STRESS TID: 0.92
Peak HR: 82 {beats}/min
SRS: 12
SSS: 13

## 2017-03-13 MED ORDER — TECHNETIUM TC 99M TETROFOSMIN IV KIT
10.9000 | PACK | Freq: Once | INTRAVENOUS | Status: AC | PRN
  Administered 2017-03-13: 10.9 via INTRAVENOUS
  Filled 2017-03-13: qty 11

## 2017-03-13 MED ORDER — REGADENOSON 0.4 MG/5ML IV SOLN
0.4000 mg | Freq: Once | INTRAVENOUS | Status: AC
  Administered 2017-03-13: 0.4 mg via INTRAVENOUS

## 2017-03-13 MED ORDER — TECHNETIUM TC 99M TETROFOSMIN IV KIT
32.6000 | PACK | Freq: Once | INTRAVENOUS | Status: AC | PRN
  Administered 2017-03-13: 32.6 via INTRAVENOUS
  Filled 2017-03-13: qty 33

## 2017-03-15 ENCOUNTER — Telehealth (HOSPITAL_COMMUNITY): Payer: Self-pay | Admitting: *Deleted

## 2017-03-15 NOTE — Telephone Encounter (Signed)
Myocardial Perfusion Imaging  Order: [id number]  Status:  Final result Visible to patient:  No (Not Released) Dx:  Chest pain, unspecified type  Notes recorded by Kennieth Rad, RN on 03/15/2017 at 10:50 AM EDT Spoke with patient and he is aware of results, no further questions at this time. ------  Notes recorded by Larey Dresser, MD on 03/13/2017 at 10:29 PM EDT Low risk study, no ischemia.

## 2017-03-15 NOTE — Telephone Encounter (Signed)
ECHOCARDIOGRAM COMPLETE  Order: [id number]  Status:  Final result Visible to patient:  No (Not Released) Dx:  Chronic systolic CHF (congestive hear...  Notes recorded by Kennieth Rad, RN on 03/15/2017 at 10:53 AM EDT Called and spoke with patient he is aware, no further questions. ------  Notes recorded by Larey Dresser, MD on 03/13/2017 at 10:28 PM EDT Stable LV function, EF 40-45%.

## 2017-03-20 ENCOUNTER — Other Ambulatory Visit (HOSPITAL_COMMUNITY): Payer: Self-pay | Admitting: Cardiology

## 2017-04-07 ENCOUNTER — Ambulatory Visit (HOSPITAL_COMMUNITY)
Admission: RE | Admit: 2017-04-07 | Discharge: 2017-04-07 | Disposition: A | Discharge status: Home / Self Care | Payer: Medicare Other | Source: Ambulatory Visit | Attending: Cardiology | Admitting: Cardiology

## 2017-04-07 ENCOUNTER — Encounter (HOSPITAL_COMMUNITY): Payer: Self-pay

## 2017-04-07 VITALS — BP 132/64 | HR 76 | Wt 185.2 lb

## 2017-04-07 DIAGNOSIS — N183 Chronic kidney disease, stage 3 (moderate): Secondary | ICD-10-CM | POA: Diagnosis not present

## 2017-04-07 DIAGNOSIS — I252 Old myocardial infarction: Secondary | ICD-10-CM | POA: Insufficient documentation

## 2017-04-07 DIAGNOSIS — J449 Chronic obstructive pulmonary disease, unspecified: Secondary | ICD-10-CM | POA: Insufficient documentation

## 2017-04-07 DIAGNOSIS — I6523 Occlusion and stenosis of bilateral carotid arteries: Secondary | ICD-10-CM | POA: Insufficient documentation

## 2017-04-07 DIAGNOSIS — Z7901 Long term (current) use of anticoagulants: Secondary | ICD-10-CM | POA: Insufficient documentation

## 2017-04-07 DIAGNOSIS — E785 Hyperlipidemia, unspecified: Secondary | ICD-10-CM | POA: Insufficient documentation

## 2017-04-07 DIAGNOSIS — E1122 Type 2 diabetes mellitus with diabetic chronic kidney disease: Secondary | ICD-10-CM | POA: Insufficient documentation

## 2017-04-07 DIAGNOSIS — K589 Irritable bowel syndrome without diarrhea: Secondary | ICD-10-CM | POA: Insufficient documentation

## 2017-04-07 DIAGNOSIS — I255 Ischemic cardiomyopathy: Secondary | ICD-10-CM | POA: Diagnosis not present

## 2017-04-07 DIAGNOSIS — I130 Hypertensive heart and chronic kidney disease with heart failure and stage 1 through stage 4 chronic kidney disease, or unspecified chronic kidney disease: Secondary | ICD-10-CM | POA: Diagnosis not present

## 2017-04-07 DIAGNOSIS — Z85820 Personal history of malignant melanoma of skin: Secondary | ICD-10-CM | POA: Insufficient documentation

## 2017-04-07 DIAGNOSIS — Z8673 Personal history of transient ischemic attack (TIA), and cerebral infarction without residual deficits: Secondary | ICD-10-CM | POA: Insufficient documentation

## 2017-04-07 DIAGNOSIS — K219 Gastro-esophageal reflux disease without esophagitis: Secondary | ICD-10-CM | POA: Diagnosis not present

## 2017-04-07 DIAGNOSIS — I480 Paroxysmal atrial fibrillation: Secondary | ICD-10-CM | POA: Insufficient documentation

## 2017-04-07 DIAGNOSIS — I5022 Chronic systolic (congestive) heart failure: Secondary | ICD-10-CM | POA: Diagnosis not present

## 2017-04-07 DIAGNOSIS — Z7982 Long term (current) use of aspirin: Secondary | ICD-10-CM | POA: Insufficient documentation

## 2017-04-07 DIAGNOSIS — Z951 Presence of aortocoronary bypass graft: Secondary | ICD-10-CM | POA: Insufficient documentation

## 2017-04-07 DIAGNOSIS — I2510 Atherosclerotic heart disease of native coronary artery without angina pectoris: Secondary | ICD-10-CM | POA: Diagnosis not present

## 2017-04-07 DIAGNOSIS — E1151 Type 2 diabetes mellitus with diabetic peripheral angiopathy without gangrene: Secondary | ICD-10-CM | POA: Insufficient documentation

## 2017-04-07 DIAGNOSIS — Z87891 Personal history of nicotine dependence: Secondary | ICD-10-CM | POA: Insufficient documentation

## 2017-04-07 DIAGNOSIS — I4892 Unspecified atrial flutter: Secondary | ICD-10-CM | POA: Diagnosis not present

## 2017-04-07 LAB — BASIC METABOLIC PANEL
Anion gap: 9 (ref 5–15)
BUN: 36 mg/dL — ABNORMAL HIGH (ref 6–20)
CHLORIDE: 102 mmol/L (ref 101–111)
CO2: 27 mmol/L (ref 22–32)
CREATININE: 1.7 mg/dL — AB (ref 0.61–1.24)
Calcium: 9.3 mg/dL (ref 8.9–10.3)
GFR calc non Af Amer: 35 mL/min — ABNORMAL LOW (ref >60)
GFR, EST AFRICAN AMERICAN: 40 mL/min — AB (ref >60)
GLUCOSE: 114 mg/dL — AB (ref 65–99)
Potassium: 4.1 mmol/L (ref 3.5–5.1)
Sodium: 138 mmol/L (ref 135–145)

## 2017-04-07 MED ORDER — CARVEDILOL 6.25 MG PO TABS
9.3750 mg | ORAL_TABLET | Freq: Two times a day (BID) | ORAL | 6 refills | Status: DC
Start: 2017-04-07 — End: 2017-04-21

## 2017-04-07 NOTE — Patient Instructions (Signed)
STOP Nifedipine.  INCREASE Carvedilol to 9.375 mg twice daily. Can take 3 tabs twice daily of current pills at home. New prescription has been sent to your pharmacy.  Once you run out of your current tablets at home, you will take 1.5 tabs twice daily of this new prescription.  Call in 2 weeks to report blood pressure.  Routine lab work today. Will notify you of abnormal results, otherwise no news is good news!  Follow up 2 months with Dr. [name].  Do the following things EVERYDAY: 1) Weigh yourself in the morning before breakfast. Write it down and keep it in a log. 2) Take your medicines as prescribed 3) Eat low salt foods-Limit salt (sodium) to 2000 mg per day.  4) Stay as active as you can everyday 5) Limit all fluids for the day to less than 2 liters

## 2017-04-09 NOTE — Progress Notes (Signed)
Patient ID: Andrew [name], male   DOB: 1928/11/27, 83 y.o.   MRN: [id number] PCP: Dr. Ronnald Ramp Cardiology: Dr. [name]   83 yo with history of CKD, CAD s/p CABG, and ischemic cardiomyopathy with primarily diastolic CHF presents for cardiology followup.  He has a history of PAD followed at [facility].  I took him for [facility] in 11/15. 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  He had had occlusion of a relatively small LAD, there were right to left collaterals and no intervention was done (medical management).    He and his wife went on a cruise along the [employer] in 5/16.  He admits to considerable dietary indiscretion (high sodium diet).  It appears that he developed a hypertensive crisis along with chest pain while on the ship. He went into atrial fibrillation and developed CHF.  He was taken to the hospital in [place], [place].  He was in the ICU for about a week on Bipap intermittently.  Troponin peaked at 0.09 during this admission.  He was diuresed and after about 2 wks left the hospital and was able to fly home.    Cardiolite (6/16) showed EF 42%, prior anterolateral MI, no ischemia. Echo in 10/16 with EF 40-45%.    He was admitted on 07/06/15 for RLQ pain and fever.  He was treated for UTI and had a kidney stone as well.  He developed SOB after IVF were given for AKI.  He was treated for acute COPD exacerbation as well as CHF decompensation.  He was diuresed with IV lasix.  His weight on admission was 179 and got as high as 188 after IVF. Discharge weight was 181.  He was admitted in 10/16 with a cerebellar CVA.  He has some resultant imbalance.   He was again admitted in 11/16 with acute on chronic systolic CHF.  This was a short admission that appeared to be precipitated by a sodium load from eating at a [employer] .   At last appointment, he was volume overloaded.  I increased his Lasix.  3/18 Echo showed stable EF 40-45%.  He was also having chest pain so I did a Cardiolite in 3/18, no  ischemia. Today, he is doing better.  Weight down 4 lbs.  Decreased dyspnea.  No problems walking on flat ground, able to do yardwork (garden, etc).  He has been sleeping in his [employer] long-term for his back.  No definite orthopnea/PND.  No lightheadedness.  BP controlled.   Labs (12/14): K 4.8, creatinine 1.5 Labs (5/15): LDL 92 Labs (11/15): K 4.1, creatinine 1.5 Labs (12/15): K 4, creatinine 1.5 Labs (1/16): LFTs normal, LDL 51, HDL 47 Labs (5/16): TnI 0.09 Labs (6/16): K 4.8 => 4.4, creatinine 1.79 => 2.26, HCT 28.7 Labs (7/16) K 4.2, creatinine 1.98, LFTs normal, TSH normal Labs (8/16): HCT 27.1 Labs (9/16): hgb 8.1, K 4.3, creatinine 1.8, LFTs normal, TSH elevated Labs (10/16): LDL 74, HDL 45, TSH 5.19 (increased), free T3 and free T4 normal.  Labs (11/16): K 3.5, creatinine 1.47, AST 84, ALT 89 Labs (12/16): K 4.3, creatinine 1.58, AST 43, ALT 42 Labs (3/17): K 4.2, creatinine 1.4, hgb 10.7, AST 36, ALT 45 Labs (6/17): K 4, creatinine 1.69, hgb 10.9, proBNP 389, TSH mildly elevated Labs (8/17): Free T4 and T3 normal, TSH mildly elevated, K 4.2, creatinine 1.7, LFTs normal, hgb 10.4 Labs (12/17): TSH elevated but free T3 and T4 normal, K 4.5, creatinine 1.9, LDL 45, HDL 39, LFTs  normal, HCT 32.9 Labs (1/18): K 4.4, creatinine 1.93 Labs (3/18): K 4.3, creatinine 1.87, BNP 187, hgb 11.1, LDL 75, LFTs normal  PMH: 1. CKD 2. HTN 3. Hyperlipidemia 4. AAA: s/p surgery with left renal artery bypass in 1997 5. GERD 6. IBS 7. Melanoma s/p reception 8. Diabetes: Diet-controlled 9. Carotid stenosis: Carotid dopplers (4/15) with 40-59% bilateral stenosis. Carotid dopplers (7/16) with < 40% BICA stenosis.  Carotid dopplers (10/16) with 40-59% BICA stenosis.  - Carotids (8/17) with minimal disease.  10. PAD: 9/14 ABIs 0.91 right 1.09 left.  7/16 ABIs 0.86 right, 1.1 left; aortoiliac duplex with < 50% bilateral iliac stenosis.  - Aortoiliac duplex (8/17) with < 50% bilateral iliac  stenosis.   - ABIs (10/17): left 0.91, right 0.98, left TBI 0.44 (abnormal).  11. CAD: Has super-dominant RCA. s/p CABG 1992.  He had Taxus DES to mid PDA in 5/02. LHC (1/05) with 90% ostial PDA (had DES to this vessel), totally occluded RIMA-PDA, totally occluded PLV, sequential SVG-PLV with 1 branch occluded.  LHC (11/15) with left main off the right cusp, 40-50% ostial left main, total occlusion of the proximal LAD (small vessel) with right to left collaterals, large super-dominant RCA with 50% ISR mid RCA, 50% ostial PDA, patent SVG-PLV, RIMA-PDA known atretic. Lexiscan Cardiolite (6/16) with EF 42%, prior anterolateral MI, no ischemia.  - Lexiscan Cardiolite (3/18): EF 51%, basal to mid anterolateral fixed defect with no ischemia.  12. Ischemic cardiomyopathy: Echo (12/09) with EF 45-50%, basal to mid inferolateral hypokinesis. Echo (6/15) with EF 45-50%, akinesis of the mid to apical inferolateral wall.  Echo (6/16) with EF 50-55%, mild LVH, inferior hypokinesis, mild MR.  Echo (10/16) with EF 40-45%, moderate LVH, mildly decreased RV systolic function.  - Echo (3/18) with EF 40-45%, mid anterior hypokinesis.  13. OA 14. Atrial fibrillation/flutter: Paroxysmal.  Noted during 5/16 hospitalization in [place] and in 6/16.  He developed atrial flutter in 8/16, back in NSR by 9/16.  15. Emphysema: PFTs (8/16) with FVC 88%, FEV1 74%, ratio 81%, TLC 83%, DLCO 31%.  16. Renal cysts 17. Idiopathic peripheral neuropathy.  18. Anemia.  35. 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  FH: Brother with CAD.   ROS: All systems reviewed and negative except as per HPI.   Current Outpatient Prescriptions  Medication Sig Dispense Refill  . ACCU-CHEK AVIVA PLUS test strip CHECK BLOOD SUGAR 4 TIMES A DAY. 100 each 11  . acetaminophen (TYLENOL) 500 MG tablet Take 1,000 mg by mouth every 8 (eight) hours as needed for moderate pain.      Marland Kitchen ALPRAZolam (XANAX) 0.5 MG tablet TAKE 1/2 TABLET TWICE DAILY AS NEEDED FOR ANXIETY. 60 tablet 5  . amiodarone (PACERONE) 200 MG tablet TAKE 1 TABLET ONCE DAILY. 30 tablet 6  . aspirin EC 81 MG tablet Take 1 tablet (81 mg total) by mouth daily.    Marland Kitchen atorvastatin (LIPITOR) 80 MG tablet TAKE 1 TABLET ONCE DAILY. 90 tablet 3  . carvedilol (COREG) 6.25 MG tablet Take 1.5 tablets (9.375 mg total) by mouth 2 (two) times daily. 90 tablet 6  . cilostazol (PLETAL) 100 MG tablet TAKE 1 TABLET TWICE DAILY. 180 tablet 3  . ELIQUIS 2.5 MG TABS tablet TAKE 1 TABLET TWICE DAILY. 60 tablet 5  . erythromycin with ethanol (THERAMYCIN) 2 % external solution Apply 1 application topically daily as needed (for skin irritation around neck).    Marland Kitchen  ezetimibe (ZETIA) 10 MG tablet TAKE (1) TABLET DAILY. 90 tablet 3  . FLUoxetine (PROZAC) 20 MG capsule TAKE (1) CAPSULE DAILY. 90 capsule 3  . furosemide (LASIX) 80 MG tablet 120mg  (1.5 Tabs) in the AM, take 80mg  (1 Tab) in the PM. 75 tablet 3  . glucosamine-chondroitin 500-400 MG tablet Take 1 tablet by mouth 2 (two) times daily.     . Hypromellose (GENTEAL MILD) 0.2 % SOLN Place 1 drop into both eyes daily as needed (for dry eyes).     Marland Kitchen losartan (COZAAR) 100 MG tablet TAKE 1 TABLET ONCE DAILY. 90 tablet 3  . Multiple Vitamin (MULTIVITAMIN) tablet Take 1 tablet by mouth daily.    . potassium chloride SA (K-DUR,KLOR-CON) 20 MEQ tablet TAKE 1 TABLET ONCE DAILY. 30 tablet 11  . tamsulosin (FLOMAX) 0.4 MG CAPS capsule Take 0.4 mg by mouth daily.    Marland Kitchen NITROSTAT 0.4 MG SL tablet DISSOLVE 1 TABLET UNDER TONGUE AS NEEDED FOR CHEST PAIN,MAY REPEAT IN5 MINUTES FOR 2 DOSES. (Patient not taking: Reported on 02/23/2017) 10 tablet 1  . temazepam (RESTORIL) 30 MG capsule TAKE 1 CAPSULE AT BEDTIME AS NEEDED FOR SLEEP. (Patient not taking: Reported on 02/23/2017) 30 capsule 3   No current facility-administered medications for this encounter.    BP 132/64   Pulse 76   Wt 185 lb 4 oz (84 kg)    SpO2 98%   BMI 29.01 kg/m   General: NAD. Ambulated in the clinic without difficulty Neck: JVP 7 cm. No thyromegaly or thyroid nodule.  Lungs: Occasional rhonchi.  CV: Nondisplaced PMI.  Heart regular S1/S2, no S3/S4, 2/6 early SEM RUSB. Trace ankle edema.  Right carotid bruit. Difficult to palpate pedal pulses.  Abdomen: Soft, nontender, non-distended, no hepatosplenomegaly.  Neurologic: Alert and oriented x 3.  Psych: Normal affect. Extremities: No clubbing or cyanosis.  Assessment/Plan:  1. CAD: s/p CABG.  Cath in 11/15 showed occluded LAD with left to right collaterals.  The LAD was a relatively small vessel (super-dominant right).  Managed medically. Lexiscan Cardiolite in 6/16 with infarction but no ischemia.  Cardiolite 3/18 with infarction, no ischemia. No further chest pain.  - Continue ASA 81.   - Continue statin. - Continue Coreg.   2. Ischemic cardiomyopathy with systolic CHF: EF 93-81% on 3/18 echo.  NYHA class II symptoms, improved.  He looks near-euvolemic with weight down. - Continue current losartan 100 mg daily  - I will have him stop nifedipine CR and increase Coreg to 9.375 mg bid.  He will check BP daily and call us with readings in 2 wks.   - Continue Lasix 120 qam/80 qpm.  BMET today.  3. Carotid stenosis: Stable, followed at [facility].  4. PAD: Stable claudication.  Most recent ABIs not markedly different from the past.    - Continue cilostazol, unable to come off due to severe calf pain when he stops it.  - Follows with [facility].  5. Hyperlipidemia: Good lipids in 3/18.  6. CKD III: BMET today. 7. Atrial fibrillation/flutter:  Paroxysmal, NSR today.  - Continue amiodarone 200 mg.  LFTs and TSH recently normal.  Will need to get regular eye exams. - Continue Eliquis 2.5 bid.  8. Suspected OSA: He cancelled his sleep study due to mask discomfort 9. COPD: History of smoking. PFTs suggestive of emphysema.  10. CVA: Cerebellar, 10/16.  He has had some resulting imbalance.   He is no longer driving.  11. Hypertension: Stopping nifedipine XR and increasing Coreg as  above.     Loralie Champagne 04/09/2017

## 2017-04-11 ENCOUNTER — Encounter: Payer: Self-pay | Admitting: Podiatry

## 2017-04-11 ENCOUNTER — Ambulatory Visit (INDEPENDENT_AMBULATORY_CARE_PROVIDER_SITE_OTHER): Payer: Medicare Other | Admitting: Podiatry

## 2017-04-11 DIAGNOSIS — B351 Tinea unguium: Secondary | ICD-10-CM | POA: Diagnosis not present

## 2017-04-11 DIAGNOSIS — M79676 Pain in unspecified toe(s): Secondary | ICD-10-CM

## 2017-04-11 DIAGNOSIS — I739 Peripheral vascular disease, unspecified: Secondary | ICD-10-CM

## 2017-04-11 NOTE — Progress Notes (Signed)
Patient ID: Andrew [name], male   DOB: [birth year], 83 y.o.   MRN: [id number] Complaint:  Visit Type: Patient returns to my office for continued preventative foot care services. Complaint: Patient states" my nails have grown long and thick and become painful to walk and wear shoes" Patient has been diagnosed with DM with no foot complications. The patient presents for preventative foot care services. No changes to ROS  Podiatric Exam: Vascular: dorsalis pedis and posterior tibial pulses are palpable bilateral. Capillary return is immediate. Temperature gradient is WNL. Skin turgor WNL  Sensorium: Normal Semmes Weinstein monofilament test. Normal tactile sensation bilaterally. Nail Exam: Pt has thick disfigured discolored nails with subungual debris noted bilateral entire nail hallux through fifth toenails Ulcer Exam: There is no evidence of ulcer or pre-ulcerative changes or infection. Orthopedic Exam: Muscle tone and strength are WNL. No limitations in general ROM. No crepitus or effusions noted. Foot type and digits show no abnormalities. Bony prominences are unremarkable. 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 Skin: No Porokeratosis. No infection or ulcers  Diagnosis:  Onychomycosis, , Pain in right toe, pain in left toes  Treatment & Plan Procedures and Treatment: Consent by patient was obtained for treatment procedures. The patient understood the discussion of treatment and procedures well. All questions were answered thoroughly reviewed. Debridement of mycotic and hypertrophic toenails, 1 through 5 bilateral and clearing of subungual debris. No ulceration, no infection noted.  Return Visit-Office Procedure: Patient instructed to return to the office for a follow up visit 10 weeks. for continued evaluation and treatment.   Gardiner Barefoot DPM

## 2017-04-21 ENCOUNTER — Telehealth (HOSPITAL_COMMUNITY): Payer: Self-pay | Admitting: *Deleted

## 2017-04-21 MED ORDER — CARVEDILOL 12.5 MG PO TABS: 12.5000 mg | ORAL_TABLET | Freq: Two times a day (BID) | ORAL | 3 refills | Status: DC

## 2017-04-21 NOTE — Telephone Encounter (Signed)
-----   Message from Larey Dresser, MD sent at 04/21/2017  2:50 PM EDT ----- Increase Coreg to 12.5 mg bid.

## 2017-04-21 NOTE — Telephone Encounter (Signed)
Pt aware of medication change 

## 2017-05-10 DIAGNOSIS — Z006 Encounter for examination for normal comparison and control in clinical research program: Secondary | ICD-10-CM

## 2017-05-10 NOTE — Progress Notes (Addendum)
Patient present for [facility] HF Study visit 8. States he has been doing well. States he used SL nitroglycerin x1 on Feb 10th the last night of his recent cruise at approximately 8:50pm; he was going to bed. 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  He states "I do not feel the chest pain I had was heart related as there was not difference after taking the nitroglycerin, it resolved in 10 minutes on it's own. I over ate salt the entire week of my cruise. I saw Dr. [name] at the beginning of March and I had a Cardiolite test that showed no damage to my heart." States he did have excess water weight for which Dr. [name] increased his diuretic for a 5 days; weight returned to normal and he has been well. Verbalizes compliance with study medications and has no questions regarding study. Study IP dispensed. Returned all used IP bottles.  Next research appointment Sept 6th 2018.  Very pleasant.

## 2017-05-16 ENCOUNTER — Other Ambulatory Visit (HOSPITAL_COMMUNITY): Payer: Self-pay | Admitting: Cardiology

## 2017-06-14 ENCOUNTER — Encounter (HOSPITAL_COMMUNITY): Payer: Medicare Other

## 2017-06-20 ENCOUNTER — Encounter: Payer: Self-pay | Admitting: Podiatry

## 2017-06-20 ENCOUNTER — Ambulatory Visit (INDEPENDENT_AMBULATORY_CARE_PROVIDER_SITE_OTHER): Payer: Medicare Other | Admitting: Podiatry

## 2017-06-20 DIAGNOSIS — E1159 Type 2 diabetes mellitus with other circulatory complications: Secondary | ICD-10-CM

## 2017-06-20 DIAGNOSIS — M79676 Pain in unspecified toe(s): Secondary | ICD-10-CM | POA: Diagnosis not present

## 2017-06-20 DIAGNOSIS — B351 Tinea unguium: Secondary | ICD-10-CM

## 2017-06-20 DIAGNOSIS — I739 Peripheral vascular disease, unspecified: Secondary | ICD-10-CM

## 2017-06-20 NOTE — Progress Notes (Signed)
Patient ID: DAY DEERY, male   DOB: [birth year], 83 y.o.   MRN: [id number] Complaint:  Visit Type: Patient returns to my office for continued preventative foot care services. Complaint: Patient states" my nails have grown long and thick and become painful to walk and wear shoes" Patient has been diagnosed with DM with no foot complications. The patient presents for preventative foot care services. No changes to ROS  Podiatric Exam: Vascular: dorsalis pedis and posterior tibial pulses are palpable bilateral. Capillary return is immediate. Temperature gradient is WNL. Skin turgor WNL  Sensorium: Normal Semmes Weinstein monofilament test. Normal tactile sensation bilaterally. Nail Exam: Pt has thick disfigured discolored nails with subungual debris noted bilateral entire nail hallux through fifth toenails Ulcer Exam: There is no evidence of ulcer or pre-ulcerative changes or infection. Orthopedic Exam: Muscle tone and strength are WNL. No limitations in general ROM. No crepitus or effusions noted. Foot type and digits show no abnormalities. Bony prominences are unremarkable. 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 Skin: No Porokeratosis. No infection or ulcers  Diagnosis:  Onychomycosis, , Pain in right toe, pain in left toes  Treatment & Plan Procedures and Treatment: Consent by patient was obtained for treatment procedures. The patient understood the discussion of treatment and procedures well. All questions were answered thoroughly reviewed. Debridement of mycotic and hypertrophic toenails, 1 through 5 bilateral and clearing of subungual debris. No ulceration, no infection noted.  Return Visit-Office Procedure: Patient instructed to return to the office for a follow up visit 10 weeks. for continued evaluation and treatment.   Gardiner Barefoot DPM

## 2017-06-22 ENCOUNTER — Other Ambulatory Visit: Payer: Self-pay | Admitting: *Deleted

## 2017-06-22 ENCOUNTER — Ambulatory Visit (HOSPITAL_COMMUNITY)
Admission: RE | Admit: 2017-06-22 | Discharge: 2017-06-22 | Disposition: A | Discharge status: Home / Self Care | Payer: Medicare Other | Source: Ambulatory Visit | Attending: Cardiology | Admitting: Cardiology

## 2017-06-22 ENCOUNTER — Encounter (HOSPITAL_COMMUNITY): Payer: Self-pay

## 2017-06-22 VITALS — BP 100/58 | HR 63 | Wt 189.8 lb

## 2017-06-22 DIAGNOSIS — K219 Gastro-esophageal reflux disease without esophagitis: Secondary | ICD-10-CM | POA: Diagnosis not present

## 2017-06-22 DIAGNOSIS — I6529 Occlusion and stenosis of unspecified carotid artery: Secondary | ICD-10-CM | POA: Diagnosis not present

## 2017-06-22 DIAGNOSIS — I2510 Atherosclerotic heart disease of native coronary artery without angina pectoris: Secondary | ICD-10-CM | POA: Diagnosis present

## 2017-06-22 DIAGNOSIS — I70213 Atherosclerosis of native arteries of extremities with intermittent claudication, bilateral legs: Secondary | ICD-10-CM | POA: Diagnosis not present

## 2017-06-22 DIAGNOSIS — Z7901 Long term (current) use of anticoagulants: Secondary | ICD-10-CM | POA: Diagnosis not present

## 2017-06-22 DIAGNOSIS — I2582 Chronic total occlusion of coronary artery: Secondary | ICD-10-CM | POA: Diagnosis not present

## 2017-06-22 DIAGNOSIS — E1122 Type 2 diabetes mellitus with diabetic chronic kidney disease: Secondary | ICD-10-CM | POA: Insufficient documentation

## 2017-06-22 DIAGNOSIS — Z7982 Long term (current) use of aspirin: Secondary | ICD-10-CM | POA: Diagnosis not present

## 2017-06-22 DIAGNOSIS — I130 Hypertensive heart and chronic kidney disease with heart failure and stage 1 through stage 4 chronic kidney disease, or unspecified chronic kidney disease: Secondary | ICD-10-CM | POA: Insufficient documentation

## 2017-06-22 DIAGNOSIS — E1151 Type 2 diabetes mellitus with diabetic peripheral angiopathy without gangrene: Secondary | ICD-10-CM | POA: Diagnosis not present

## 2017-06-22 DIAGNOSIS — I480 Paroxysmal atrial fibrillation: Secondary | ICD-10-CM | POA: Insufficient documentation

## 2017-06-22 DIAGNOSIS — I252 Old myocardial infarction: Secondary | ICD-10-CM | POA: Insufficient documentation

## 2017-06-22 DIAGNOSIS — E785 Hyperlipidemia, unspecified: Secondary | ICD-10-CM | POA: Insufficient documentation

## 2017-06-22 DIAGNOSIS — Z8673 Personal history of transient ischemic attack (TIA), and cerebral infarction without residual deficits: Secondary | ICD-10-CM | POA: Diagnosis not present

## 2017-06-22 DIAGNOSIS — I714 Abdominal aortic aneurysm, without rupture: Secondary | ICD-10-CM | POA: Diagnosis not present

## 2017-06-22 DIAGNOSIS — Z951 Presence of aortocoronary bypass graft: Secondary | ICD-10-CM | POA: Diagnosis present

## 2017-06-22 DIAGNOSIS — K589 Irritable bowel syndrome without diarrhea: Secondary | ICD-10-CM | POA: Diagnosis not present

## 2017-06-22 DIAGNOSIS — I255 Ischemic cardiomyopathy: Secondary | ICD-10-CM | POA: Diagnosis not present

## 2017-06-22 DIAGNOSIS — N189 Chronic kidney disease, unspecified: Secondary | ICD-10-CM | POA: Insufficient documentation

## 2017-06-22 DIAGNOSIS — Z87891 Personal history of nicotine dependence: Secondary | ICD-10-CM | POA: Diagnosis not present

## 2017-06-22 DIAGNOSIS — I70219 Atherosclerosis of native arteries of extremities with intermittent claudication, unspecified extremity: Secondary | ICD-10-CM

## 2017-06-22 DIAGNOSIS — I5022 Chronic systolic (congestive) heart failure: Secondary | ICD-10-CM

## 2017-06-22 LAB — BASIC METABOLIC PANEL
Anion gap: 6 (ref 5–15)
BUN: 42 mg/dL — AB (ref 6–20)
CHLORIDE: 102 mmol/L (ref 101–111)
CO2: 29 mmol/L (ref 22–32)
Calcium: 9.2 mg/dL (ref 8.9–10.3)
Creatinine, Ser: 2.37 mg/dL — ABNORMAL HIGH (ref 0.61–1.24)
GFR calc Af Amer: 27 mL/min — ABNORMAL LOW (ref >60)
GFR calc non Af Amer: 23 mL/min — ABNORMAL LOW (ref >60)
GLUCOSE: 137 mg/dL — AB (ref 65–99)
POTASSIUM: 5.3 mmol/L — AB (ref 3.5–5.1)
Sodium: 137 mmol/L (ref 135–145)

## 2017-06-22 LAB — CBC
HCT: 36.5 % — ABNORMAL LOW (ref 39.0–52.0)
Hemoglobin: 11.8 g/dL — ABNORMAL LOW (ref 13.0–17.0)
MCH: 31.6 pg (ref 26.0–34.0)
MCHC: 32.3 g/dL (ref 30.0–36.0)
MCV: 97.6 fL (ref 78.0–100.0)
Platelets: 151 10*3/uL (ref 150–400)
RBC: 3.74 MIL/uL — ABNORMAL LOW (ref 4.22–5.81)
RDW: 13.4 % (ref 11.5–15.5)
WBC: 7.1 10*3/uL (ref 4.0–10.5)

## 2017-06-22 NOTE — Patient Instructions (Addendum)
Labs today (will call for abnormal results, otherwise no news is good news)  You need to schedule a follow up with [facility] as soon as possible.   Follow up in 3 Months.

## 2017-06-23 NOTE — Progress Notes (Signed)
Patient ID: Andrew [name], male   DOB: 1928/12/10, 83 y.o.   MRN: [id number] PCP: Dr. Ronnald Ramp Cardiology: Dr. [name]   83 yo with history of CKD, CAD s/p CABG, and ischemic cardiomyopathy with primarily diastolic CHF presents for cardiology followup.  He has a history of PAD followed at [facility].  I took him for [facility] in 11/15. 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  He had had occlusion of a relatively small LAD, there were right to left collaterals and no intervention was done (medical management).    He and his wife went on a cruise along the [employer] in 5/16.  He admits to considerable dietary indiscretion (high sodium diet).  It appears that he developed a hypertensive crisis along with chest pain while on the ship. He went into atrial fibrillation and developed CHF.  He was taken to the hospital in [place], [place].  He was in the ICU for about a week on Bipap intermittently.  Troponin peaked at 0.09 during this admission.  He was diuresed and after about 2 wks left the hospital and was able to fly home.    Cardiolite (6/16) showed EF 42%, prior anterolateral MI, no ischemia. Echo in 10/16 with EF 40-45%.    He was admitted on 07/06/15 for RLQ pain and fever.  He was treated for UTI and had a kidney stone as well.  He developed SOB after IVF were given for AKI.  He was treated for acute COPD exacerbation as well as CHF decompensation.  He was diuresed with IV lasix.  His weight on admission was 179 and got as high as 188 after IVF. Discharge weight was 181.  He was admitted in 10/16 with a cerebellar CVA.  He has some resultant imbalance.   He was again admitted in 11/16 with acute on chronic systolic CHF.  This was a short admission that appeared to be precipitated by a sodium load from eating at a [employer] .   3/18 Echo showed stable EF 40-45%.  He was also having chest pain so I did a Cardiolite in 3/18, no ischemia.   He returns for followup today.  Main complaint is  increased calf cramping with walking.  He has to stop after about a block.  He does not get short of breath with normal exertion.  He is able to work in his garden without problems.  He has been playing pool volleyball at [facility].  He has been sleeping in his [place] long-term for his back.  No definite orthopnea/PND.  No lightheadedness.  BP controlled.   ECG (personally reviewed): NSR, 1st degree AV block, poor RWP  Labs (12/14): K 4.8, creatinine 1.5 Labs (5/15): LDL 92 Labs (11/15): K 4.1, creatinine 1.5 Labs (12/15): K 4, creatinine 1.5 Labs (1/16): LFTs normal, LDL 51, HDL 47 Labs (5/16): TnI 0.09 Labs (6/16): K 4.8 => 4.4, creatinine 1.79 => 2.26, HCT 28.7 Labs (7/16) K 4.2, creatinine 1.98, LFTs normal, TSH normal Labs (8/16): HCT 27.1 Labs (9/16): hgb 8.1, K 4.3, creatinine 1.8, LFTs normal, TSH elevated Labs (10/16): LDL 74, HDL 45, TSH 5.19 (increased), free T3 and free T4 normal.  Labs (11/16): K 3.5, creatinine 1.47, AST 84, ALT 89 Labs (12/16): K 4.3, creatinine 1.58, AST 43, ALT 42 Labs (3/17): K 4.2, creatinine 1.4, hgb 10.7, AST 36, ALT 45 Labs (6/17): K 4, creatinine 1.69, hgb 10.9, proBNP 389, TSH mildly elevated Labs (8/17): Free T4 and T3 normal,  TSH mildly elevated, K 4.2, creatinine 1.7, LFTs normal, hgb 10.4 Labs (12/17): TSH elevated but free T3 and T4 normal, K 4.5, creatinine 1.9, LDL 45, HDL 39, LFTs normal, HCT 32.9 Labs (1/18): K 4.4, creatinine 1.93 Labs (3/18): K 4.3, creatinine 1.87, BNP 187, hgb 11.1, LDL 75, LFTs normal Labs (4/18): K 4.1, creatinine 1.7  PMH: 1. CKD 2. HTN 3. Hyperlipidemia 4. AAA: s/p surgery with left renal artery bypass in 1997 5. GERD 6. IBS 7. Melanoma s/p reception 8. Diabetes: Diet-controlled 9. Carotid stenosis: Carotid dopplers (4/15) with 40-59% bilateral stenosis. Carotid dopplers (7/16) with < 40% BICA stenosis.  Carotid dopplers (10/16) with 40-59% BICA stenosis.  - Carotids (8/17) with minimal disease.   10. PAD: 9/14 ABIs 0.91 right 1.09 left.  7/16 ABIs 0.86 right, 1.1 left; aortoiliac duplex with < 50% bilateral iliac stenosis.  - Aortoiliac duplex (8/17) with < 50% bilateral iliac stenosis.   - ABIs (10/17): left 0.91, right 0.98, left TBI 0.44 (abnormal).  11. CAD: Has super-dominant RCA. s/p CABG 1992.  He had Taxus DES to mid PDA in 5/02. LHC (1/05) with 90% ostial PDA (had DES to this vessel), totally occluded RIMA-PDA, totally occluded PLV, sequential SVG-PLV with 1 branch occluded.  LHC (11/15) with left main off the right cusp, 40-50% ostial left main, total occlusion of the proximal LAD (small vessel) with right to left collaterals, large super-dominant RCA with 50% ISR mid RCA, 50% ostial PDA, patent SVG-PLV, RIMA-PDA known atretic. Lexiscan Cardiolite (6/16) with EF 42%, prior anterolateral MI, no ischemia.  - Lexiscan Cardiolite (3/18): EF 51%, basal to mid anterolateral fixed defect with no ischemia.  12. Ischemic cardiomyopathy: Echo (12/09) with EF 45-50%, basal to mid inferolateral hypokinesis. Echo (6/15) with EF 45-50%, akinesis of the mid to apical inferolateral wall.  Echo (6/16) with EF 50-55%, mild LVH, inferior hypokinesis, mild MR.  Echo (10/16) with EF 40-45%, moderate LVH, mildly decreased RV systolic function.  - Echo (3/18) with EF 40-45%, mid anterior hypokinesis.  13. OA 14. Atrial fibrillation/flutter: Paroxysmal.  Noted during 5/16 hospitalization in [place] and in 6/16.  He developed atrial flutter in 8/16, back in NSR by 9/16.  15. Emphysema: PFTs (8/16) with FVC 88%, FEV1 74%, ratio 81%, TLC 83%, DLCO 31%.  16. Renal cysts 17. Idiopathic peripheral neuropathy.  18. Anemia.  37. 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  FH: Brother with CAD.   ROS: All systems reviewed and negative except as per HPI.   Current Outpatient Prescriptions  Medication Sig Dispense Refill  . ACCU-CHEK  AVIVA PLUS test strip CHECK BLOOD SUGAR 4 TIMES A DAY. 100 each 11  . acetaminophen (TYLENOL) 500 MG tablet Take 1,000 mg by mouth every 8 (eight) hours as needed for moderate pain.     Marland Kitchen ALPRAZolam (XANAX) 0.5 MG tablet TAKE 1/2 TABLET TWICE DAILY AS NEEDED FOR ANXIETY. 60 tablet 5  . amiodarone (PACERONE) 200 MG tablet TAKE 1 TABLET ONCE DAILY. 30 tablet 6  . aspirin EC 81 MG tablet Take 1 tablet (81 mg total) by mouth daily.    Marland Kitchen atorvastatin (LIPITOR) 80 MG tablet TAKE 1 TABLET ONCE DAILY. 90 tablet 3  . carvedilol (COREG) 12.5 MG tablet Take 1 tablet (12.5 mg total) by mouth 2 (two) times daily. 60 tablet 3  . cilostazol (PLETAL) 100 MG tablet TAKE 1 TABLET TWICE DAILY. 180 tablet 3  . ELIQUIS 2.5 MG  TABS tablet TAKE 1 TABLET TWICE DAILY. 60 tablet 5  . erythromycin with ethanol (THERAMYCIN) 2 % external solution Apply 1 application topically daily as needed (for skin irritation around neck).    . ezetimibe (ZETIA) 10 MG tablet TAKE (1) TABLET DAILY. 90 tablet 3  . FLUoxetine (PROZAC) 20 MG capsule TAKE (1) CAPSULE DAILY. 90 capsule 3  . furosemide (LASIX) 80 MG tablet TAKE 1 & 1/2 TABLETS IN THE MORNING AND 1 EACH EVENING AS DIRECTED. 75 tablet 3  . glucosamine-chondroitin 500-400 MG tablet Take 1 tablet by mouth 2 (two) times daily.     . Hypromellose (GENTEAL MILD) 0.2 % SOLN Place 1 drop into both eyes daily as needed (for dry eyes).     Marland Kitchen losartan (COZAAR) 100 MG tablet TAKE 1 TABLET ONCE DAILY. 90 tablet 3  . Multiple Vitamin (MULTIVITAMIN) tablet Take 1 tablet by mouth daily.    Marland Kitchen NITROSTAT 0.4 MG SL tablet DISSOLVE 1 TABLET UNDER TONGUE AS NEEDED FOR CHEST PAIN,MAY REPEAT IN5 MINUTES FOR 2 DOSES. 10 tablet 1  . potassium chloride SA (K-DUR,KLOR-CON) 20 MEQ tablet TAKE 1 TABLET ONCE DAILY. 30 tablet 11  . tamsulosin (FLOMAX) 0.4 MG CAPS capsule Take 0.4 mg by mouth daily.    . temazepam (RESTORIL) 30 MG capsule TAKE 1 CAPSULE AT BEDTIME AS NEEDED FOR SLEEP. 30 capsule 3   No current  facility-administered medications for this encounter.    BP (!) 100/58   Pulse 63   Wt 189 lb 12 oz (86.1 kg)   SpO2 98%   BMI 29.72 kg/m   General: NAD. Ambulated in the clinic without difficulty Neck: JVP 7 cm. No thyromegaly or thyroid nodule.  Lungs: Clear to auscultation bilaterally.  CV: Nondisplaced PMI.  Heart regular S1/S2, no S3/S4, 2/6 early SEM RUSB. Trace ankle edema bilaterally.  Right carotid bruit. Difficult to palpate pedal pulses.  Abdomen: Soft, nontender, non-distended, no hepatosplenomegaly.  Neurologic: Alert and oriented x 3.  Psych: Normal affect. Extremities: No clubbing or cyanosis.  Assessment/Plan:  1. CAD: s/p CABG.  Cath in 11/15 showed occluded LAD with left to right collaterals.  The LAD was a relatively small vessel (super-dominant right).  Managed medically. Lexiscan Cardiolite in 6/16 with infarction but no ischemia.  Cardiolite 3/18 with infarction, no ischemia. No further chest pain.  - Continue ASA 81.   - Continue statin. - Continue Coreg.   2. Ischemic cardiomyopathy with systolic CHF: EF 16-10% on 3/18 echo.  NYHA class II symptoms, improved.  He looks near-euvolemic.  - Continue current losartan 100 mg daily  - Continue Coreg 12.5 mg bid.    - Continue Lasix 120 qam/80 qpm.  BMET today.  3. Carotid stenosis: Stable, followed at [facility].  4. PAD: Claudication seems to be worsening. Has to stop after 1 block.  - Continue cilostazol, unable to come off due to severe calf pain when he stops it.  - I will work on getting him an appt at [facility] where his PAD is followed.  5. Hyperlipidemia: Good lipids in 3/18.  6. CKD III: BMET today. 7. Atrial fibrillation/flutter:  Paroxysmal, NSR today.  - Continue amiodarone 200 mg.  LFTs and TSH recently normal.  Will need to get regular eye exams. - Continue Eliquis 2.5 bid.  8. Suspected OSA: He cancelled his sleep study due to mask discomfort 9. COPD: History of smoking. PFTs suggestive of emphysema.  10. CVA:  Cerebellar, 10/16.  He has had some resulting imbalance.  He  is no longer driving.  11. Hypertension: BP controlled.      Loralie Champagne 06/23/2017

## 2017-06-26 ENCOUNTER — Telehealth (HOSPITAL_COMMUNITY): Payer: Self-pay | Admitting: *Deleted

## 2017-06-26 ENCOUNTER — Telehealth: Payer: Self-pay

## 2017-06-26 DIAGNOSIS — I5022 Chronic systolic (congestive) heart failure: Secondary | ICD-10-CM

## 2017-06-26 MED ORDER — FUROSEMIDE 80 MG PO TABS: 80.0000 mg | ORAL_TABLET | Freq: Two times a day (BID) | ORAL | 3 refills | Status: DC

## 2017-06-26 NOTE — Telephone Encounter (Signed)
Pt aware of lab results and med changes 

## 2017-06-26 NOTE — Telephone Encounter (Signed)
Pt is due for an appt.  

## 2017-06-26 NOTE — Telephone Encounter (Signed)
-----   Message from Larey Dresser, MD sent at 06/25/2017  9:26 PM EDT ----- Stop KCl. Decrease Lasix to 80 mg bid.  Cut back on sodium in diet.  Repeat BMET in 1 week.

## 2017-06-26 NOTE — Addendum Note (Signed)
Addended by: Harvie Junior on: 06/26/2017 04:07 PM   Modules accepted: Orders

## 2017-06-27 ENCOUNTER — Ambulatory Visit: Payer: Medicare Other | Admitting: Family

## 2017-06-27 ENCOUNTER — Encounter (HOSPITAL_COMMUNITY): Payer: Medicare Other

## 2017-06-29 ENCOUNTER — Encounter: Payer: Self-pay | Admitting: Family

## 2017-06-29 ENCOUNTER — Ambulatory Visit (HOSPITAL_COMMUNITY)
Admission: RE | Admit: 2017-06-29 | Discharge: 2017-06-29 | Disposition: A | Discharge status: Home / Self Care | Payer: Medicare Other | Source: Ambulatory Visit | Attending: Vascular Surgery | Admitting: Vascular Surgery

## 2017-06-29 ENCOUNTER — Ambulatory Visit (INDEPENDENT_AMBULATORY_CARE_PROVIDER_SITE_OTHER): Payer: Medicare Other | Admitting: Family

## 2017-06-29 VITALS — BP 114/60 | HR 75 | Temp 97.2°F | Resp 20 | Ht 67.0 in | Wt 187.0 lb

## 2017-06-29 DIAGNOSIS — Z8679 Personal history of other diseases of the circulatory system: Secondary | ICD-10-CM

## 2017-06-29 DIAGNOSIS — I872 Venous insufficiency (chronic) (peripheral): Secondary | ICD-10-CM

## 2017-06-29 DIAGNOSIS — Z87891 Personal history of nicotine dependence: Secondary | ICD-10-CM

## 2017-06-29 DIAGNOSIS — Z9889 Other specified postprocedural states: Secondary | ICD-10-CM | POA: Diagnosis not present

## 2017-06-29 DIAGNOSIS — I70219 Atherosclerosis of native arteries of extremities with intermittent claudication, unspecified extremity: Secondary | ICD-10-CM | POA: Diagnosis not present

## 2017-06-29 DIAGNOSIS — N184 Chronic kidney disease, stage 4 (severe): Secondary | ICD-10-CM | POA: Diagnosis not present

## 2017-06-29 DIAGNOSIS — I779 Disorder of arteries and arterioles, unspecified: Secondary | ICD-10-CM

## 2017-06-29 DIAGNOSIS — I714 Abdominal aortic aneurysm, without rupture, unspecified: Secondary | ICD-10-CM

## 2017-06-29 DIAGNOSIS — I6523 Occlusion and stenosis of bilateral carotid arteries: Secondary | ICD-10-CM

## 2017-06-29 NOTE — Telephone Encounter (Signed)
Tried to call pt but no answer

## 2017-06-29 NOTE — Progress Notes (Signed)
[facility] [place] HISTORY AND PHYSICAL   MRN : [id number]  History of Present Illness:   Andrew [name] is a 83 y.o. male patient of Dr. Kellie Simmering who is status post AAA repair and left renal artery bypass graft in 1997 by Dr. Amedeo Plenty.  He also has PAD and carotid artery stenosis. He returns at the request of Dr. [name] due to worsening claudication.  After walking less than 1 block both calves start to cramp, relieved by rest, this has worsened from 2 blocks walking when he had he had bilateral buttocks, thighs, and calves aching. He does not currently c/o buttocks and thighs pain or weakness with walking.  He denies any known lumbar spine problems.  He does have known arthritis. He denies rest pain, denies non-healing wounds or ulcers.   He had an exacerbation of CHF in June 2016 while in [place], then had severe UTI early July 2016; since then he has had dehydration, anemia, weakness; feels he is slowly improving, is walking with a cane.  He denies any history of TIA or stroke symptoms, specifically he denies a history of amaurosis fugax or monocular blindness, unilateral facial drooping, hemiplegia, or receptive or expressive aphasia.   He denies any new back or abdominal pain.  He remains physically active. 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   Pt Diabetic: No, is now diet controlled, last A1C result on file was 6.2 on 01-25-17 His serum creatinine on 06-22-17 was 2.37, eGFR was 23 (stage 4 CKD).  Pt smoker: former smoker, quit at age 55  Pt meds include:  Statin :Yes Betablocker: No ASA: no Other anticoagulants/antiplatelets: Eliquis started early June 2016, prescribed by his cardiologist (per pt). It appears that he had transient atrial fib.    Current Outpatient Prescriptions  Medication Sig Dispense Refill  . ACCU-CHEK AVIVA PLUS test strip CHECK BLOOD SUGAR 4 TIMES A DAY. 100 each 11  . acetaminophen (TYLENOL) 500 MG tablet Take 1,000 mg by  mouth every 8 (eight) hours as needed for moderate pain.     Marland Kitchen ALPRAZolam (XANAX) 0.5 MG tablet TAKE 1/2 TABLET TWICE DAILY AS NEEDED FOR ANXIETY. 60 tablet 5  . amiodarone (PACERONE) 200 MG tablet TAKE 1 TABLET ONCE DAILY. 30 tablet 6  . aspirin EC 81 MG tablet Take 1 tablet (81 mg total) by mouth daily.    Marland Kitchen atorvastatin (LIPITOR) 80 MG tablet TAKE 1 TABLET ONCE DAILY. 90 tablet 3  . carvedilol (COREG) 12.5 MG tablet Take 1 tablet (12.5 mg total) by mouth 2 (two) times daily. 60 tablet 3  . cilostazol (PLETAL) 100 MG tablet TAKE 1 TABLET TWICE DAILY. 180 tablet 3  . ELIQUIS 2.5 MG TABS tablet TAKE 1 TABLET TWICE DAILY. 60 tablet 5  . erythromycin with ethanol (THERAMYCIN) 2 % external solution Apply 1 application topically daily as needed (for skin irritation around neck).    . ezetimibe (ZETIA) 10 MG tablet TAKE (1) TABLET DAILY. 90 tablet 3  . FLUoxetine (PROZAC) 20 MG capsule TAKE (1) CAPSULE DAILY. 90 capsule 3  . furosemide (LASIX) 80 MG tablet Take 1 tablet (80 mg total) by mouth 2 (two) times daily. 60 tablet 3  . glucosamine-chondroitin 500-400 MG tablet Take 1 tablet by mouth 2 (two) times daily.     . Hypromellose (GENTEAL MILD) 0.2 % SOLN Place 1 drop into both eyes daily as needed (for dry eyes).     Marland Kitchen losartan (COZAAR) 100 MG tablet TAKE 1 TABLET ONCE DAILY.  90 tablet 3  . Multiple Vitamin (MULTIVITAMIN) tablet Take 1 tablet by mouth daily.    Marland Kitchen NITROSTAT 0.4 MG SL tablet DISSOLVE 1 TABLET UNDER TONGUE AS NEEDED FOR CHEST PAIN,MAY REPEAT IN5 MINUTES FOR 2 DOSES. 10 tablet 1  . tamsulosin (FLOMAX) 0.4 MG CAPS capsule Take 0.4 mg by mouth daily.    . temazepam (RESTORIL) 30 MG capsule TAKE 1 CAPSULE AT BEDTIME AS NEEDED FOR SLEEP. 30 capsule 3   No current facility-administered medications for this visit.     Past Medical History:  Diagnosis Date  . AAA (abdominal aortic aneurysm) ([place])   . Anemia   . Arthritis   . CAD (coronary artery disease)   . Cancer ([place])    Melanoma  - Back  . Carotid artery stenosis   . CHF (congestive heart failure) ([place])   . Cyst of kidney, acquired   . Depression   . Diabetes mellitus   . DVT (deep venous thrombosis) ([place])   . Fatty liver 2008  . GERD (gastroesophageal reflux disease)   . Hyperlipidemia   . Hypertension   . IBS (irritable bowel syndrome)   . LVF (left ventricular failure) ([place])   . Paroxysmal atrial fibrillation ([facility])   . Pneumonia Jan. 2014  . Shortness of breath dyspnea   . Thyroid disease   . Typical atrial flutter ([place])   . Vitamin D deficiency     Social History Social History  Substance Use Topics  . 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 Smokeless tobacco: Never Used     Comment: quit atleast 25 yrs ago, per pt  . Alcohol use 1.8 oz/week    3 Shots of liquor per week     Comment: socially    Family History Family History  Problem Relation Age of Onset  . Colon cancer Father   . Coronary artery disease Brother   . Diabetes Brother   . Heart disease Brother   . Hyperlipidemia Brother   . Peripheral vascular disease Brother        Varicose Veins  . Throat cancer Brother        abdominal cancer?   . Diabetes Son   . Hyperlipidemia Son     Surgical History Past Surgical History:  Procedure Laterality Date  . ABDOMINAL AORTIC ANEURYSM REPAIR  2002  . CARDIAC CATHETERIZATION    . cardiac stents  2005  . CORONARY ARTERY BYPASS GRAFT  1992  . EYE SURGERY     [place]  . JOINT REPLACEMENT     Bilateral knee arthroplasty  . LEFT HEART CATHETERIZATION WITH CORONARY ANGIOGRAM N/A 10/30/2014   Procedure: LEFT HEART CATHETERIZATION WITH CORONARY ANGIOGRAM;  Surgeon: Larey Dresser, MD;  [facility] C[facility] LAB;  Service: Cardiovascular;  Laterality: N/A;  . TOTAL KNEE ARTHROPLASTY     bilateral- Pt fell 3 steps  . UTI  June 2016   While in [place]  2016    Allergies  Allergen Reactions  . Metoprolol Shortness Of Breath and Palpitations    Heart starts racing. Shallow breathing   .  Metformin And Related Nausea And Vomiting    Current Outpatient Prescriptions  Medication Sig Dispense Refill  . ACCU-CHEK AVIVA PLUS test strip CHECK BLOOD SUGAR 4 TIMES A DAY. 100 each 11  . acetaminophen (TYLENOL) 500 MG tablet Take 1,000 mg by mouth every 8 (eight) hours as needed for moderate pain.     Marland Kitchen ALPRAZolam (XANAX) 0.5 MG tablet TAKE 1/2 TABLET TWICE DAILY AS  NEEDED FOR ANXIETY. 60 tablet 5  . amiodarone (PACERONE) 200 MG tablet TAKE 1 TABLET ONCE DAILY. 30 tablet 6  . aspirin EC 81 MG tablet Take 1 tablet (81 mg total) by mouth daily.    Marland Kitchen atorvastatin (LIPITOR) 80 MG tablet TAKE 1 TABLET ONCE DAILY. 90 tablet 3  . carvedilol (COREG) 12.5 MG tablet Take 1 tablet (12.5 mg total) by mouth 2 (two) times daily. 60 tablet 3  . cilostazol (PLETAL) 100 MG tablet TAKE 1 TABLET TWICE DAILY. 180 tablet 3  . ELIQUIS 2.5 MG TABS tablet TAKE 1 TABLET TWICE DAILY. 60 tablet 5  . erythromycin with ethanol (THERAMYCIN) 2 % external solution Apply 1 application topically daily as needed (for skin irritation around neck).    . ezetimibe (ZETIA) 10 MG tablet TAKE (1) TABLET DAILY. 90 tablet 3  . FLUoxetine (PROZAC) 20 MG capsule TAKE (1) CAPSULE DAILY. 90 capsule 3  . furosemide (LASIX) 80 MG tablet Take 1 tablet (80 mg total) by mouth 2 (two) times daily. 60 tablet 3  . glucosamine-chondroitin 500-400 MG tablet Take 1 tablet by mouth 2 (two) times daily.     . Hypromellose (GENTEAL MILD) 0.2 % SOLN Place 1 drop into both eyes daily as needed (for dry eyes).     Marland Kitchen losartan (COZAAR) 100 MG tablet TAKE 1 TABLET ONCE DAILY. 90 tablet 3  . Multiple Vitamin (MULTIVITAMIN) tablet Take 1 tablet by mouth daily.    Marland Kitchen NITROSTAT 0.4 MG SL tablet DISSOLVE 1 TABLET UNDER TONGUE AS NEEDED FOR CHEST PAIN,MAY REPEAT IN5 MINUTES FOR 2 DOSES. 10 tablet 1  . tamsulosin (FLOMAX) 0.4 MG CAPS capsule Take 0.4 mg by mouth daily.    . temazepam (RESTORIL) 30 MG capsule TAKE 1 CAPSULE AT BEDTIME AS NEEDED FOR SLEEP. 30  capsule 3   No current facility-administered medications for this visit.      REVIEW OF SYSTEMS: See HPI for pertinent positives and negatives.  Physical Examination Vitals:   06/29/17 0942  BP: 114/60  Pulse: 75  Resp: 20  Temp: (!) 97.2 F (36.2 C)  TempSrc: Oral  SpO2: 93%  Weight: 187 lb (84.8 kg)  Height: 5' 7"  (1.702 m)   Body mass index is 29.29 kg/m.  General:  WDWN male in NAD  Gait: normal HENT: WNL  Eyes: PERRLA Pulmonary: normal non-labored breathing, CTAB, fair air moement Cardiac: RRR, no detected murmur.   Abdomen: soft, NT, moderate sized asymptomatic reducible ventral hernia with use of abdominal muscles. Skin: no rashes, no ulcers, no cellulitis, see Extremities.  VASCULAR EXAM Carotid Bruits Left Right   Negative  Negative    Radial pulses are 2+ palpable bilaterally Abdominal aortic pulse is not palpable  VASCULAR EXAM: Extremitieswithout ischemic changes  without Gangrene; without open wounds. Both lower legs with venous stasis dermatitis changes, with trace edema.   LE Pulses  LEFT  RIGHT   FEMORAL  palpable palpable   POPLITEAL  not palpable  not palpable  POSTERIOR TIBIAL  not palpable  not palpable   DORSALIS PEDIS ANTERIOR TIBIAL  Faintly palpable  Not palpable    Musculoskeletal: mild generalized muscle wasting and atrophy. Neurologic:A&O X 3; appropriate affect; MOTOR FUNCTION: 4/5 throughout, CN 2-12 intact except is somewhat hard of hearing. Speech is fluent/normal    ASSESSMENT:  Andrew [name] is a 83 y.o. male who is status post AAA repair and left renal artery bypass graft in 1997. He also has PAD and carotid artery stenosis. He  has worsening claudication in both calves with walking, no signs of ischemia in his lower extremities. The discoloration in his lower legs is consistent with venous stasis dermatitis changes, no edema in his lower legs at this time.  He has no history  of stroke or TIA.  I discussed with Dr. Oneida Alar pt worsening calf claudication, and also his serum creatinine last month of 2.37, eGFR was 23 (stage 4 CKD).  The next diagnostic and possible treatment step would be an arteriogram; the contrast used with this has the potential of causing the loss of his remaining renal function.  He has fallen a few times, once with an evaluation recently in the ED. He is not a candidate for a graduated walking program, see Plan.  Discussed with Andrew [name] that we advise conservative measures to address his PAOD: daily seated leg exercises in lieu of graduated walking program (since he has fallen several times) and maximal medical management. He is in agreement with this.   DATA  ABI (Date: 06/29/2017):  R:   ABI: 0.75 (was 0.98 on 08-25-16),   PT: mono (was mono)  DP: mono (was tri)  TBI:  0.46 (was 1.02)  L:   ABI: 0.97 (was 0.36 and Non Compressible),   PT: mono (was mono)  DP: bi (was tri)  TBI: 0.54 (was 0.66)   Bilateral Aortoiliac Duplex (08-25-16): < 50% bilateral iliac artery stenosis and a patent left renal artery bypass. No significant change compared to exam of 07/17/15.  Carotid Duplex (08-25-16): 1-39% bilateral ICA stenoses; vertebral arteries are antegrade, subclavian arteries are multiphasic. No change compared to 07-17-15.    PLAN:   Daily seated leg exercises as discussed and demonstrated.  Based on today's exam and non-invasive vascular lab results, the patient will follow up in 6 months with the following tests: ABI's and carotid duplex.  I advised him to notify us if he develops worsening symptoms in his legs or feet.   I discussed in depth with the patient the nature of atherosclerosis, and emphasized the importance of maximal medical management including strict control of blood pressure, blood glucose, and lipid levels, obtaining regular exercise, and cessation of smoking.  The patient is aware that without maximal  medical management the underlying atherosclerotic disease process will progress, limiting the benefit of any interventions.  The patient was given information about stroke prevention and what symptoms should prompt the patient to seek immediate medical care.  The patient was given information about PAD including signs, symptoms, treatment, what symptoms should prompt the patient to seek immediate medical care, and risk reduction measures to take.  Thank you for allowing [place] to participate in this patient's care.  Clemon Chambers, RN, MSN, FNP-C [facility] Office: [phone]  Clinic MD: [facility] 06/29/2017 9:48 AM

## 2017-06-29 NOTE — Patient Instructions (Signed)
Peripheral Vascular Disease Peripheral vascular disease (PVD) is a disease of the blood vessels that are not part of your heart and brain. A simple term for PVD is poor circulation. In most cases, PVD narrows the blood vessels that carry blood from your heart to the rest of your body. This can result in a decreased supply of blood to your arms, legs, and internal organs, like your stomach or kidneys. However, it most often affects a person's lower legs and feet. There are two types of PVD.  Organic PVD. This is the more common type. It is caused by damage to the structure of blood vessels.  Functional PVD. This is caused by conditions that make blood vessels contract and tighten (spasm).  Without treatment, PVD tends to get worse over time. PVD can also lead to acute ischemic limb. This is when an arm or limb suddenly has trouble getting enough blood. This is a medical emergency. Follow these instructions at home:  Take medicines only as told by your doctor.  Do not use any tobacco products, including cigarettes, chewing tobacco, or electronic cigarettes. If you need help quitting, ask your doctor.  Lose weight if you are overweight, and maintain a healthy weight as told by your doctor.  Eat a diet that is low in fat and cholesterol. If you need help, ask your doctor.  Exercise regularly. Ask your doctor for some good activities for you.  Take good care of your feet. ? Wear comfortable shoes that fit well. ? Check your feet often for any cuts or sores. Contact a doctor if:  You have cramps in your legs while walking.  You have leg pain when you are at rest.  You have coldness in a leg or foot.  Your skin changes.  You are unable to get or have an erection (erectile dysfunction).  You have cuts or sores on your feet that are not healing. Get help right away if:  Your arm or leg turns cold and blue.  Your arms or legs become red, warm, swollen, painful, or numb.  You have  chest pain or trouble breathing.  You suddenly have weakness in your face, arm, or leg.  You become very confused or you cannot speak.  You suddenly have a very bad headache.  You suddenly cannot see. This information is not intended to replace advice given to you by your health care provider. Make sure you discuss any questions you have with your health care provider. Document Released: 03/08/2010 Document Revised: 05/19/2016 Document Reviewed: 05/22/2014 Elsevier Interactive Patient Education  2017 [place].      Preventing Cerebrovascular Disease Arteries are blood vessels that carry blood that contains oxygen from the heart to all parts of the body. Cerebrovascular disease affects arteries that supply the brain. Any condition that blocks or disrupts blood flow to the brain can cause cerebrovascular disease. Brain cells that lose blood supply start to die within minutes (stroke). Stroke is the main danger of cerebrovascular disease. Atherosclerosis and high blood pressure are common causes of cerebrovascular disease. Atherosclerosis is narrowing and hardening of an artery that results when fat, cholesterol, calcium, or other substances (plaque) build up inside an artery. Plaque reduces blood flow through the artery. High blood pressure increases the risk of bleeding inside the brain. Making diet and lifestyle changes to prevent atherosclerosis and high blood pressure lowers your risk of cerebrovascular disease. What nutrition changes can be made? Eat more fruits, vegetables, and whole grains. Reduce how much saturated  fat you eat. To do this, eat less red meat and fewer full-fat dairy products. Eat healthy proteins instead of red meat. Healthy proteins include: Fish. Eat fish that contains heart-healthy omega-3 fatty acids, twice a week. Examples include salmon, albacore tuna, mackerel, and herring. Chicken. Nuts. Low-fat or nonfat yogurt. Avoid processed meats, like bacon and  lunchmeat. Avoid foods that contain: A lot of sugar, such as sweets and drinks with added sugar. A lot of salt (sodium). Avoid adding extra salt to your food, as told by your health care provider. Trans fats, such as margarine and baked goods. Trans fats may be listed as "partially hydrogenated oils" on food labels. Check food labels to see how much sodium, sugar, and trans fats are in foods. Use vegetable oils that contain low amounts of saturated fat, such as olive oil or canola oil. What lifestyle changes can be made? Drink alcohol in moderation. This means no more than 1 drink a day for nonpregnant women and 2 drinks a day for men. One drink equals 12 oz of beer, 5 oz of wine, or 1 oz of hard liquor. If you are overweight, ask your health care provider to recommend a weight-loss plan for you. Losing 5-10 lb (2.2-4.5 kg) can reduce your risk of diabetes, atherosclerosis, and high blood pressure. Exercise for 30?60 minutes on most days, or as much as told by your health care provider. Do moderate-intensity exercise, such as brisk walking, bicycling, and water aerobics. Ask your health care provider which activities are safe for you. Do not use any products that contain nicotine or tobacco, such as cigarettes and e-cigarettes. If you need help quitting, ask your health care provider. Why are these changes important? Making these changes lowers your risk of many diseases that can cause cerebrovascular disease and stroke. Stroke is a leading cause of death and disability. Making these changes also improves your overall health and quality of life. What can I do to lower my risk? The following factors make you more likely to develop cerebrovascular disease: Being overweight. Smoking. Being physically inactive. Eating a high-fat diet. Having certain health conditions, such as: Diabetes. High blood pressure. Heart disease. Atherosclerosis. High cholesterol. Sickle cell disease.  Talk with  your health care provider about your risk for cerebrovascular disease. Work with your health care provider to control diseases that you have that may contribute to cerebrovascular disease. Your health care provider may prescribe medicines to help prevent major causes of cerebrovascular disease. Where to find more information: Learn more about preventing cerebrovascular disease from: [place], Lung, and [place]: [employer] Centers for Disease Control and Prevention: http://www.curry-wood.biz/  Summary Cerebrovascular disease can lead to a stroke. Atherosclerosis and high blood pressure are major causes of cerebrovascular disease. Making diet and lifestyle changes can reduce your risk of cerebrovascular disease. Work with your health care provider to get your risk factors under control to reduce your risk of cerebrovascular disease. This information is not intended to replace advice given to you by your health care provider. Make sure you discuss any questions you have with your health care provider. Document Released: 12/27/2015 Document Revised: 07/01/2016 Document Reviewed: 12/27/2015 Elsevier Interactive Patient Education  2018 [employer].    Stroke Prevention Some medical conditions and behaviors are associated with an increased chance of having a stroke. You may prevent a stroke by making healthy choices and managing medical conditions. How can I reduce my risk of having a stroke?  Stay physically active. Get at least 30 minutes  of activity on most or all days.  Do not smoke. It may also be helpful to avoid exposure to secondhand smoke.  Limit alcohol use. Moderate alcohol use is considered to be: ? No more than 2 drinks per day for men. ? No more than 1 drink per day for nonpregnant women.  Eat healthy foods. This involves: ? Eating 5 or more servings of fruits and vegetables a day. ? Making dietary changes that address high  blood pressure (hypertension), high cholesterol, diabetes, or obesity.  Manage your cholesterol levels. ? Making food choices that are high in fiber and low in saturated fat, trans fat, and cholesterol may control cholesterol levels. ? Take any prescribed medicines to control cholesterol as directed by your health care provider.  Manage your diabetes. ? Controlling your carbohydrate and sugar intake is recommended to manage diabetes. ? Take any prescribed medicines to control diabetes as directed by your health care provider.  Control your hypertension. ? Making food choices that are low in salt (sodium), saturated fat, trans fat, and cholesterol is recommended to manage hypertension. ? Ask your health care provider if you need treatment to lower your blood pressure. Take any prescribed medicines to control hypertension as directed by your health care provider. ? If you are 83-57 years of age, have your blood pressure checked every 3-5 years. If you are 37 years of age or older, have your blood pressure checked every year.  Maintain a healthy weight. ? Reducing calorie intake and making food choices that are low in sodium, saturated fat, trans fat, and cholesterol are recommended to manage weight.  Stop drug abuse.  Avoid taking birth control pills. ? Talk to your health care provider about the risks of taking birth control pills if you are over 55 years old, smoke, get migraines, or have ever had a blood clot.  Get evaluated for sleep disorders (sleep apnea). ? Talk to your health care provider about getting a sleep evaluation if you snore a lot or have excessive sleepiness.  Take medicines only as directed by your health care provider. ? For some people, aspirin or blood thinners (anticoagulants) are helpful in reducing the risk of forming abnormal blood clots that can lead to stroke. If you have the irregular heart rhythm of atrial fibrillation, you should be on a blood thinner unless  there is a good reason you cannot take them. ? Understand all your medicine instructions.  Make sure that other conditions (such as anemia or atherosclerosis) are addressed. Get help right away if:  You have sudden weakness or numbness of the face, arm, or leg, especially on one side of the body.  Your face or eyelid droops to one side.  You have sudden confusion.  You have trouble speaking (aphasia) or understanding.  You have sudden trouble seeing in one or both eyes.  You have sudden trouble walking.  You have dizziness.  You have a loss of balance or coordination.  You have a sudden, severe headache with no known cause.  You have new chest pain or an irregular heartbeat. Any of these symptoms may represent a serious problem that is an emergency. Do not wait to see if the symptoms will go away. Get medical help at once. Call your local emergency services (911 in [place] Do not drive yourself to the hospital. This information is not intended to replace advice given to you by your health care provider. Make sure you discuss any questions you have with your health care  provider. Document Released: 01/19/2005 Document Revised: 05/19/2016 Document Reviewed: 06/14/2013 Elsevier Interactive Patient Education  2017 [employer].

## 2017-07-01 ENCOUNTER — Encounter (HOSPITAL_COMMUNITY): Payer: Self-pay | Admitting: Emergency Medicine

## 2017-07-01 ENCOUNTER — Emergency Department (HOSPITAL_COMMUNITY)
Admission: EM | Admit: 2017-07-01 | Discharge: 2017-07-01 | Disposition: A | Discharge status: Home / Self Care | Payer: Medicare Other | Attending: Emergency Medicine | Admitting: Emergency Medicine

## 2017-07-01 DIAGNOSIS — W57XXXA Bitten or stung by nonvenomous insect and other nonvenomous arthropods, initial encounter: Secondary | ICD-10-CM | POA: Insufficient documentation

## 2017-07-01 DIAGNOSIS — I4891 Unspecified atrial fibrillation: Secondary | ICD-10-CM | POA: Diagnosis not present

## 2017-07-01 DIAGNOSIS — Y939 Activity, unspecified: Secondary | ICD-10-CM | POA: Diagnosis not present

## 2017-07-01 DIAGNOSIS — E119 Type 2 diabetes mellitus without complications: Secondary | ICD-10-CM | POA: Diagnosis not present

## 2017-07-01 DIAGNOSIS — I509 Heart failure, unspecified: Secondary | ICD-10-CM | POA: Diagnosis not present

## 2017-07-01 DIAGNOSIS — S90861A Insect bite (nonvenomous), right foot, initial encounter: Secondary | ICD-10-CM | POA: Diagnosis present

## 2017-07-01 DIAGNOSIS — Y929 Unspecified place or not applicable: Secondary | ICD-10-CM | POA: Insufficient documentation

## 2017-07-01 DIAGNOSIS — Z7901 Long term (current) use of anticoagulants: Secondary | ICD-10-CM | POA: Insufficient documentation

## 2017-07-01 DIAGNOSIS — Z79899 Other long term (current) drug therapy: Secondary | ICD-10-CM | POA: Diagnosis not present

## 2017-07-01 DIAGNOSIS — Z87891 Personal history of nicotine dependence: Secondary | ICD-10-CM | POA: Diagnosis not present

## 2017-07-01 DIAGNOSIS — Z86718 Personal history of other venous thrombosis and embolism: Secondary | ICD-10-CM | POA: Diagnosis not present

## 2017-07-01 DIAGNOSIS — Y999 Unspecified external cause status: Secondary | ICD-10-CM | POA: Diagnosis not present

## 2017-07-01 DIAGNOSIS — I10 Essential (primary) hypertension: Secondary | ICD-10-CM | POA: Diagnosis not present

## 2017-07-01 MED ORDER — TRIAMCINOLONE 0.1 % CREAM:EUCERIN CREAM 1:1
TOPICAL_CREAM | Freq: Three times a day (TID) | CUTANEOUS | Status: DC | PRN
  Administered 2017-07-01: 1 via TOPICAL
  Filled 2017-07-01: qty 1

## 2017-07-01 NOTE — ED Notes (Signed)
Pt from home with complaints of itching on the back of the back of his left heel. Pt states he did not feel a bug bite him but he has been itching since last night. Pt denies pain. No swelling. Pt states he tried to put ice on the affected area with minimal relief. Pt has strong distal pulse

## 2017-07-01 NOTE — Discharge Instructions (Signed)
Please apply cream to affected area every 6 hrs as needed for your discomfort.  Monitor for sign of infection.  If you develop fever, increasing pain, or red streak extending from the affected site, please return for further care.  Otherwise follow up with your primary care provider.

## 2017-07-01 NOTE — ED Provider Notes (Signed)
[facility] DEPT Provider Note   CSN: [id number] Arrival date & time: 07/01/17  1438     History   Chief Complaint Chief Complaint  Patient presents with  . Insect Bite    HPI Andrew [name] is a 83 y.o. male.  HPI   83 year old male with history of melanoma, CHF, DVT, paroxysmal atrial fibrillation currently on Eliquis, diabetes presenting to ED from home with complaints of R foot discomfort. Patient states yesterday he noticed some localized skin irritation to the back of his right heel. He described it as an itchy sensation and felt that something may have bit him. He monitor that site and today it has become increasingly more painful. He described as a throbbing sensation, prompting him to come here for further evaluation. He denies any specific treatment tried. No associated fever, calf pain, focal numbness or weakness. He did not saw any particular insect that bit him. He has no other complaint.   Past Medical History:  Diagnosis Date  . AAA (abdominal aortic aneurysm) ([place])   . Anemia   . Arthritis   . CAD (coronary artery disease)   . Cancer ([place])    Melanoma - Back  . Carotid artery stenosis   . CHF (congestive heart failure) ([place])   . Cyst of kidney, acquired   . Depression   . Diabetes mellitus   . DVT (deep venous thrombosis) ([place])   . Fatty liver 2008  . GERD (gastroesophageal reflux disease)   . Hyperlipidemia   . Hypertension   . IBS (irritable bowel syndrome)   . LVF (left ventricular failure) ([place])   . Paroxysmal atrial fibrillation ([facility])   . Pneumonia Jan. 2014  . Shortness of breath dyspnea   . Thyroid disease   . Typical atrial flutter ([place])   . Vitamin D deficiency     Patient Active Problem List   Diagnosis Date Noted  . Calf pain 05/25/2016  . Leg edema, left 05/25/2016  . Carotid artery stenosis 01/12/2016  . Chronic systolic CHF (congestive heart failure) ([place]) 12/02/2015  . Cerebellar infarct ([place]) 10/16/2015  . 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 PVD (peripheral vascular disease) ([place])   . Hereditary and idiopathic peripheral neuropathy 10/02/2015  . Peripelvic (lymphatic) cyst   . Pernicious anemia 07/15/2015  . Bilateral renal cysts 07/12/2015  . Lung nodule, 86mm RML CT 07/12/15 07/12/2015  . Constipation 07/12/2015  . Kidney stone on right side 07/12/2015  . CKD (chronic kidney disease), stage III 06/14/2015  . Chronic diastolic CHF (congestive heart failure) ([place]) 05/28/2015  . Paroxysmal atrial fibrillation ([place]) 05/28/2015  . Routine general medical examination at a health care facility 03/24/2015  . AAA (abdominal aortic aneurysm) without rupture ([place]) 09/30/2014  . Left-sided low back pain without sciatica 07/21/2014  . Cardiomyopathy, ischemic 05/23/2014  . DM (diabetes mellitus), type 2 with renal complications ([place]) 92/10/9416  . CORONARY ATHEROSCLEROSIS NATIVE CORONARY ARTERY 11/09/2010  . ANXIETY DEPRESSION 03/18/2010  . Hypothyroidism 02/03/2009  . Hyperlipidemia with target LDL less than 100 06/14/2007  . Essential hypertension 06/14/2007  . GERD 06/14/2007    Past Surgical History:  Procedure Laterality Date  . ABDOMINAL AORTIC ANEURYSM REPAIR  2002  . CARDIAC CATHETERIZATION    . cardiac stents  2005  . CORONARY ARTERY BYPASS GRAFT  1992  . EYE SURGERY     [place]  . JOINT REPLACEMENT     Bilateral knee arthroplasty  . LEFT HEART CATHETERIZATION WITH  CORONARY ANGIOGRAM N/A 10/30/2014   Procedure: LEFT HEART CATHETERIZATION WITH CORONARY ANGIOGRAM;  Surgeon: Larey Dresser, MD;  Location: [facility] CATH LAB;  Service: Cardiovascular;  Laterality: N/A;  . TOTAL KNEE ARTHROPLASTY     bilateral- Pt fell 3 steps  . UTI  June 2016   While in [place]  2016       Home Medications    Prior to Admission medications   Medication Sig Start Date End Date Taking? Authorizing Provider  ACCU-CHEK AVIVA PLUS test strip CHECK BLOOD SUGAR 4 TIMES A DAY. 08/30/16    Janith Lima, MD  acetaminophen (TYLENOL) 500 MG tablet Take 1,000 mg by mouth every 8 (eight) hours as needed for moderate pain.     [provider]  ALPRAZolam (XANAX) 0.5 MG tablet TAKE 1/2 TABLET TWICE DAILY AS NEEDED FOR ANXIETY. 11/20/16   Janith Lima, MD  amiodarone (PACERONE) 200 MG tablet TAKE 1 TABLET ONCE DAILY. 12/22/16   Larey Dresser, MD  aspirin EC 81 MG tablet Take 1 tablet (81 mg total) by mouth daily. 05/22/14   Larey Dresser, MD  atorvastatin (LIPITOR) 80 MG tablet TAKE 1 TABLET ONCE DAILY. 05/02/16   Larey Dresser, MD  carvedilol (COREG) 12.5 MG tablet Take 1 tablet (12.5 mg total) by mouth 2 (two) times daily. 04/21/17   Larey Dresser, MD  cilostazol (PLETAL) 100 MG tablet TAKE 1 TABLET TWICE DAILY. 08/02/16   Janith Lima, MD  ELIQUIS 2.5 MG TABS tablet TAKE 1 TABLET TWICE DAILY. 01/16/17   Larey Dresser, MD  erythromycin with ethanol [facility]) 2 % external solution Apply 1 application topically daily as needed (for skin irritation around neck).    [provider]  ezetimibe (ZETIA) 10 MG tablet TAKE (1) TABLET DAILY. 07/25/16   Janith Lima, MD  FLUoxetine (PROZAC) 20 MG capsule TAKE (1) CAPSULE DAILY. 10/26/16   Janith Lima, MD  furosemide (LASIX) 80 MG tablet Take 1 tablet (80 mg total) by mouth 2 (two) times daily. 06/26/17   Larey Dresser, MD  glucosamine-chondroitin 500-400 MG tablet Take 1 tablet by mouth 2 (two) times daily.     [provider]  Hypromellose (GENTEAL MILD) 0.2 % SOLN Place 1 drop into both eyes daily as needed (for dry eyes).     [provider]  losartan (COZAAR) 100 MG tablet TAKE 1 TABLET ONCE DAILY. 10/10/16   Janith Lima, MD  Multiple Vitamin (MULTIVITAMIN) tablet Take 1 tablet by mouth daily.    [provider]  NITROSTAT 0.4 MG SL tablet DISSOLVE 1 TABLET UNDER TONGUE AS NEEDED FOR CHEST PAIN,MAY REPEAT IN5 MINUTES FOR 2 DOSES. 11/04/16   Janith Lima, MD  tamsulosin  (FLOMAX) 0.4 MG CAPS capsule Take 0.4 mg by mouth daily.    [provider]  temazepam (RESTORIL) 30 MG capsule TAKE 1 CAPSULE AT BEDTIME AS NEEDED FOR SLEEP. 09/14/15   Janith Lima, MD    Family History Family History  Problem Relation Age of Onset  . Colon cancer Father   . Coronary artery disease Brother   . Diabetes Brother   . Heart disease Brother   . Hyperlipidemia Brother   . Peripheral vascular disease Brother        Varicose Veins  . Throat cancer Brother        abdominal cancer?   . Diabetes Son   . Hyperlipidemia Son     Social History Social History  Substance Use Topics  . 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 Smokeless tobacco: Never Used     Comment: quit atleast 25 yrs ago, per pt  . Alcohol use 1.8 oz/week    3 Shots of liquor per week     Comment: socially     Allergies   Metoprolol and Metformin and related   Review of Systems Review of Systems  Constitutional: Negative for fever.  Skin: Negative for rash and wound.  Neurological: Negative for numbness.     Physical Exam Updated Vital Signs BP (!) 106/47 (BP Location: Left Arm)   Pulse 72   Temp 97.8 F (36.6 C) (Oral)   Resp 16   Ht 5\' 7"  (1.702 m)   Wt 81.6 kg (180 lb)   SpO2 95%   BMI 28.19 kg/m   Physical Exam  Constitutional: He appears well-developed and well-nourished. No distress.  Elderly male, well appearing in no acute discomfort.  HENT:  Head: Atraumatic.  Eyes: Conjunctivae are normal.  Neck: Neck supple.  Musculoskeletal:  Ambulate without difficulty.  Neurological: He is alert.  Skin: No rash noted.  Right foot: At the posterior heel there is a localized skin irritation approximately 4 mm in diameter without any significant signs of infection. Right ankle with full range of motion, no gross deformity. Dorsalis pedis pulse palpable.  Psychiatric: He has a normal mood and affect.  Nursing note and vitals reviewed.    ED Treatments / Results  Labs (all  labs ordered are listed, but only abnormal results are displayed) Labs Reviewed - No data to display  EKG  EKG Interpretation None       Radiology No results found.  Procedures Procedures (including critical care time)  Medications Ordered in ED Medications - No data to display   Initial Impression / Assessment and Plan / ED Course  I have reviewed the triage vital signs and the nursing notes.  Pertinent labs & imaging results that were available during my care of the patient were reviewed by me and considered in my medical decision making (see chart for details).     BP (!) 106/47 (BP Location: Left Arm)   Pulse 72   Temp 97.8 F (36.6 C) (Oral)   Resp 16   Ht 5\' 7"  (1.702 m)   Wt 81.6 kg (180 lb)   SpO2 95%   BMI 28.19 kg/m    Final Clinical Impressions(s) / ED Diagnoses   Final diagnoses:  Insect bite, initial encounter    New Prescriptions New Prescriptions   No medications on file   4:25 PM Patient here for evaluation of a localized skin irritation to the back of his right foot. This is likely due to an insect bite. It does not appears infected. No suspicion for septic joint. He is neurovascularly intact. Patient requesting for treatment, will treat with steroid cream as needed. Encourage patient to monitor for signs of infection to return if condition worsen. Otherwise he is stable for discharge. No suspicion for DVT.   Domenic Moras, PA-C 07/01/17 1652    Charlesetta Shanks, MD 07/01/17 [phone]

## 2017-07-04 ENCOUNTER — Ambulatory Visit (HOSPITAL_COMMUNITY)
Admission: RE | Admit: 2017-07-04 | Discharge: 2017-07-04 | Disposition: A | Discharge status: Home / Self Care | Payer: Medicare Other | Source: Ambulatory Visit | Attending: Internal Medicine | Admitting: Internal Medicine

## 2017-07-04 DIAGNOSIS — I5022 Chronic systolic (congestive) heart failure: Secondary | ICD-10-CM

## 2017-07-04 LAB — BASIC METABOLIC PANEL
ANION GAP: 11 (ref 5–15)
BUN: 39 mg/dL — ABNORMAL HIGH (ref 6–20)
CALCIUM: 9.2 mg/dL (ref 8.9–10.3)
CHLORIDE: 101 mmol/L (ref 101–111)
CO2: 25 mmol/L (ref 22–32)
CREATININE: 1.93 mg/dL — AB (ref 0.61–1.24)
GFR calc Af Amer: 35 mL/min — ABNORMAL LOW (ref >60)
GFR, EST NON AFRICAN AMERICAN: 30 mL/min — AB (ref >60)
GLUCOSE: 171 mg/dL — AB (ref 65–99)
POTASSIUM: 4.6 mmol/L (ref 3.5–5.1)
Sodium: 137 mmol/L (ref 135–145)

## 2017-07-05 ENCOUNTER — Other Ambulatory Visit: Payer: Self-pay | Admitting: Internal Medicine

## 2017-07-10 NOTE — Addendum Note (Signed)
Addended by: Lianne Cure A on: 07/10/2017 03:58 PM   Modules accepted: Orders

## 2017-07-20 ENCOUNTER — Ambulatory Visit (INDEPENDENT_AMBULATORY_CARE_PROVIDER_SITE_OTHER): Payer: Medicare Other | Admitting: Family Medicine

## 2017-07-20 ENCOUNTER — Encounter: Payer: Self-pay | Admitting: Family Medicine

## 2017-07-20 ENCOUNTER — Other Ambulatory Visit: Payer: Self-pay | Admitting: Internal Medicine

## 2017-07-20 ENCOUNTER — Other Ambulatory Visit: Payer: Medicare Other

## 2017-07-20 VITALS — BP 92/50 | HR 75 | Temp 98.0°F | Ht 67.0 in | Wt 189.0 lb

## 2017-07-20 DIAGNOSIS — L0291 Cutaneous abscess, unspecified: Secondary | ICD-10-CM | POA: Insufficient documentation

## 2017-07-20 MED ORDER — CLINDAMYCIN HCL 150 MG PO CAPS: 150.0000 mg | ORAL_CAPSULE | Freq: Three times a day (TID) | ORAL | 0 refills | Status: DC

## 2017-07-20 NOTE — Assessment & Plan Note (Signed)
Area of the abscess on his left tibia. Appears to have some local infection with swelling distal to this area. No signs of DVT - incised and drained today. - Wound culture collected - Clindamycin started - Advised to follow-up tomorrow to monitor.

## 2017-07-20 NOTE — Progress Notes (Signed)
Andrew [name] - 83 y.o. male MRN [id number]  Date of birth: Jul 09, 1928  SUBJECTIVE:  Including CC & ROS.  Chief Complaint  Patient presents with  . Leg Swelling    swelling in ankle and calf, nodule on lower left leg X3 weeks tender to the touch and itchy also dark red in color, patient states he thought it was a bug bite   Andrew [name] is an 83 year old male that is presenting with a left anterior leg pain. He reports this started about 3 weeks ago. He denies any trauma to the area. He denies any history of similar problems. He has had swelling of his left foot and ankle. He denies any medication use. He feels like it has gotten worse during this time. There is pain in this area. There is some fluctuance in this area.He denies any travel.   Review of Systems  Constitutional: Negative for chills and fever.  Respiratory: Negative for shortness of breath.   Cardiovascular: Negative for chest pain.  Musculoskeletal: Positive for joint swelling.  Skin: Positive for color change. Negative for rash.       Abscess on left anterior shin at the mid tibia   Allergic/Immunologic: Positive for environmental allergies.  Psychiatric/Behavioral: Negative for confusion.  Otherwise negative  HISTORY: Past Medical, Surgical, Social, and Family History Reviewed & Updated per EMR.   Pertinent Historical Findings include:  Past Medical History:  Diagnosis Date  . AAA (abdominal aortic aneurysm) ([place])   . Anemia   . Arthritis   . CAD (coronary artery disease)   . Cancer ([place])    Melanoma - Back  . Carotid artery stenosis   . CHF (congestive heart failure) ([place])   . Cyst of kidney, acquired   . Depression   . Diabetes mellitus   . DVT (deep venous thrombosis) ([place])   . Fatty liver 2008  . GERD (gastroesophageal reflux disease)   . Hyperlipidemia   . Hypertension   . IBS (irritable bowel syndrome)   . LVF (left ventricular failure) ([place])   . Paroxysmal atrial fibrillation (HCC)   . Pneumonia  Jan. 2014  . Shortness of breath dyspnea   . Thyroid disease   . Typical atrial flutter ([place])   . Vitamin D deficiency     Past Surgical History:  Procedure Laterality Date  . ABDOMINAL AORTIC ANEURYSM REPAIR  2002  . CARDIAC CATHETERIZATION    . cardiac stents  2005  . CORONARY ARTERY BYPASS GRAFT  1992  . EYE SURGERY     [facility]  . JOINT REPLACEMENT     Bilateral knee arthroplasty  . LEFT HEART CATHETERIZATION WITH CORONARY ANGIOGRAM N/A 10/30/2014   Procedure: LEFT HEART CATHETERIZATION WITH CORONARY ANGIOGRAM;  Surgeon: Larey Dresser, MD;  Location[facility] CATH LAB;  Service: Cardiovascular;  Laterality: N/A;  . TOTAL KNEE ARTHROPLASTY     bilateral- Pt fell 3 steps  . UTI  June 2016   While in [place]  2016    Allergies  Allergen Reactions  . Metoprolol Shortness Of Breath and Palpitations    Heart starts racing. Shallow breathing   . Metformin And Related Nausea And Vomiting    Family History  Problem Relation Age of Onset  . Colon cancer Father   . Coronary artery disease Brother   . Diabetes Brother   . Heart disease Brother   . Hyperlipidemia Brother   . Peripheral vascular disease Brother        Varicose Veins  . Throat  cancer Brother        abdominal cancer?   . Diabetes Son   . Hyperlipidemia Son      Social History   Social History  . Marital status: Married    Spouse name: N/A  . Number of children: N/A  . Years of education: N/A   Occupational History  . Retired    Social History Main Topics  . 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 Smokeless tobacco: Never Used     Comment: quit atleast 25 yrs ago, per pt  . Alcohol use 1.8 oz/week    3 Shots of liquor per week     Comment: socially  . Drug use: No  . Sexual activity: Yes   Other Topics Concern  . Not on file   Social History Narrative   Pt lives in [place] with spouse.   Retired from [employer].  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       PHYSICAL EXAM:  VS: BP (!) 92/50 (BP Location: Left Arm, Patient Position: Sitting, Cuff Size: Normal)   Pulse 75   Temp 98 F (36.7 C) (Oral)   Ht 5\' 7"  (1.702 m)   Wt 189 lb (85.7 kg)   SpO2 96%   BMI 29.60 kg/m  PHYSICAL EXAM: Gen: NAD, alert, cooperative with exam,  ENT: normal lips, normal nasal mucosa,  Eye: PERRL, normal conjunctiva and lids CV:  Regular rate and rhythm, +2 pedal pulses   Resp: no accessory muscle use, non-labored,  Skin: Area of fluctuance on his left anterior mid tibia, no overlying erythema, tenderness to palpation in this area fluctuance. Neuro: +2 patellar DTR's, normal sensation to touch Psych:  normal insight, alert and oriented MSK: normal gait, distal left ankle and foot swelling.  Incision and Drainage Procedure Note:  The affected area was cleaned and draped in a sterile fashion. Anesthesia was achieved using 6 mL of 2 % Lidocaine without epinephrine injected around the wound area using a 25-guage 1.5 inch needle. An 11-blade scalpel was used to incise the wound. A culture was obtained. A hemostat was used to break any loculations that were present.  A sterile dressing was applied to the area. The patient tolerated the procedure well. No complications were encountered.    ASSESSMENT & PLAN:   Abscess Area of the abscess on his left tibia. Appears to have some local infection with swelling distal to this area. No signs of DVT - incised and drained today. - Wound culture collected - Clindamycin started - Advised to follow-up tomorrow to monitor.

## 2017-07-20 NOTE — Patient Instructions (Signed)
Thank you for coming in,   Please follow-up with me tomorrow sometime between 9 and 3 to see how this is going. Please do not shower before you come in. Please pick up the antibiotics from the pharmacy and start these. Please try to take a probiotic when you're taking antibiotics.   Please feel free to call with any questions or concerns at any time, at [phone]. --Dr. Raeford Razor

## 2017-07-21 ENCOUNTER — Ambulatory Visit (INDEPENDENT_AMBULATORY_CARE_PROVIDER_SITE_OTHER): Payer: Medicare Other | Admitting: Family Medicine

## 2017-07-21 ENCOUNTER — Encounter: Payer: Self-pay | Admitting: Family Medicine

## 2017-07-21 DIAGNOSIS — L0291 Cutaneous abscess, unspecified: Secondary | ICD-10-CM

## 2017-07-21 NOTE — Patient Instructions (Signed)
Thank you for coming in,   Please take a probiotic with the antibiotics. Please follow-up with [place] if the leg appears worse redevelop a fever.   Please feel free to call with any questions or concerns at any time, at [phone]. --Dr. Raeford Razor

## 2017-07-21 NOTE — Progress Notes (Signed)
Andrew [name] - 83 y.o. male MRN [id number]  Date of birth: [birth year]  SUBJECTIVE:  Including CC & ROS.  Chief Complaint  Patient presents with  . Follow-up    leg I&D   [name] Is an 83 year male and is following up after his incision and drainage He is having some soreness over the area where his abscess was in size. He has been taking the antibiotics no complications. He denies any fevers or chills. He reports some swelling in his lower legs still.   Review of Systems  Constitutional: Negative for chills and fever.  Musculoskeletal: Negative for joint swelling.  Skin: Positive for wound. Negative for rash.    HISTORY: Past Medical, Surgical, Social, and Family History Reviewed & Updated per EMR.   Pertinent Historical Findings include:  Past Medical History:  Diagnosis Date  . AAA (abdominal aortic aneurysm) ([place])   . Anemia   . Arthritis   . CAD (coronary artery disease)   . Cancer ([place])    Melanoma - Back  . Carotid artery stenosis   . CHF (congestive heart failure) ([place])   . Cyst of kidney, acquired   . Depression   . Diabetes mellitus   . DVT (deep venous thrombosis) ([place])   . Fatty liver 2008  . GERD (gastroesophageal reflux disease)   . Hyperlipidemia   . Hypertension   . IBS (irritable bowel syndrome)   . LVF (left ventricular failure) ([place])   . Paroxysmal atrial fibrillation (HCC)   . Pneumonia Jan. 2014  . Shortness of breath dyspnea   . Thyroid disease   . Typical atrial flutter ([place])   . Vitamin D deficiency     Past Surgical History:  Procedure Laterality Date  . ABDOMINAL AORTIC ANEURYSM REPAIR  2002  . CARDIAC CATHETERIZATION    . cardiac stents  2005  . CORONARY ARTERY BYPASS GRAFT  1992  . EYE SURGERY     [facility]  . JOINT REPLACEMENT     Bilateral knee arthroplasty  . LEFT HEART CATHETERIZATION WITH CORONARY ANGIOGRAM N/A 10/30/2014   Procedure: LEFT HEART CATHETERIZATION WITH CORONARY ANGIOGRAM;  Surgeon: Larey Dresser, MD;   Location: [facility] CATH LAB;  Service: Cardiovascular;  Laterality: N/A;  . TOTAL KNEE ARTHROPLASTY     bilateral- Pt fell 3 steps  . UTI  June 2016   While in [place]  2016    Allergies  Allergen Reactions  . Metoprolol Shortness Of Breath and Palpitations    Heart starts racing. Shallow breathing   . Metformin And Related Nausea And Vomiting    Family History  Problem Relation Age of Onset  . Colon cancer Father   . Coronary artery disease Brother   . Diabetes Brother   . Heart disease Brother   . Hyperlipidemia Brother   . Peripheral vascular disease Brother        Varicose Veins  . Throat cancer Brother        abdominal cancer?   . Diabetes Son   . Hyperlipidemia Son      Social History   Social History  . Marital status: Married    Spouse name: N/A  . Number of children: N/A  . Years of education: N/A   Occupational History  . Retired    Social History Main Topics  . 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 Smokeless tobacco: Never Used     Comment: quit atleast 25 yrs ago, per pt  . Alcohol use 1.8 oz/week  3 Shots of liquor per week     Comment: socially  . Drug use: No  . Sexual activity: Yes   Other Topics Concern  . Not on file   Social History Narrative   Pt lives in [place] with spouse.   Retired from [employer].  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     PHYSICAL EXAM:  VS: BP (!) 150/80 (BP Location: Left Arm, Patient Position: Sitting, Cuff Size: Normal)   Pulse 70   Temp 97.8 F (36.6 C) (Oral)   Ht 5\' 7"  (1.702 m)   Wt 189 lb (85.7 kg)   SpO2 98%   BMI 29.60 kg/m  Physical Exam  Constitutional: He is oriented to person, place, and time. He appears well-developed and well-nourished.  HENT:  Head: Normocephalic and atraumatic.  Eyes: Conjunctivae and EOM are normal.  Neck: Normal range of motion. Neck supple.  Musculoskeletal: Normal range of motion. He exhibits tenderness.  Incision of  his abscess occurred on his mid tibia with no overlying erythema. Some swelling of his distal lower extremity to the abscess.   Neurological: He is alert and oriented to person, place, and time.  Skin: Skin is warm and dry.     ASSESSMENT & PLAN:   Abscess Some improvement of his  Swelling from yesterday. Has been taken the antibiotics with no complications -  Continue the clindamycin -  Follow-up if he has no improvement or symptoms worsen.

## 2017-07-21 NOTE — Assessment & Plan Note (Signed)
Some improvement of his  Swelling from yesterday. Has been taken the antibiotics with no complications -  Continue the clindamycin -  Follow-up if he has no improvement or symptoms worsen.

## 2017-07-22 LAB — WOUND CULTURE
Gram Stain: NONE SEEN
Gram Stain: NONE SEEN
Gram Stain: NONE SEEN
Organism ID, Bacteria: NO GROWTH

## 2017-07-31 ENCOUNTER — Other Ambulatory Visit (HOSPITAL_COMMUNITY): Payer: Self-pay | Admitting: Cardiology

## 2017-08-02 ENCOUNTER — Telehealth: Payer: Self-pay | Admitting: Internal Medicine

## 2017-08-02 NOTE — Telephone Encounter (Signed)
Patient would like to transfer to Dr. Raeford Razor when Cherylann Banas opens in October.

## 2017-08-02 NOTE — Telephone Encounter (Signed)
Ok with me 

## 2017-08-08 ENCOUNTER — Other Ambulatory Visit (HOSPITAL_COMMUNITY): Payer: Self-pay | Admitting: Cardiology

## 2017-08-09 ENCOUNTER — Other Ambulatory Visit (INDEPENDENT_AMBULATORY_CARE_PROVIDER_SITE_OTHER): Payer: Medicare Other

## 2017-08-09 ENCOUNTER — Ambulatory Visit (INDEPENDENT_AMBULATORY_CARE_PROVIDER_SITE_OTHER): Payer: Medicare Other | Admitting: Family Medicine

## 2017-08-09 ENCOUNTER — Encounter: Payer: Self-pay | Admitting: Family Medicine

## 2017-08-09 VITALS — BP 130/68 | HR 67 | Temp 97.7°F | Ht 67.0 in | Wt 192.0 lb

## 2017-08-09 DIAGNOSIS — N183 Chronic kidney disease, stage 3 unspecified: Secondary | ICD-10-CM

## 2017-08-09 DIAGNOSIS — L0291 Cutaneous abscess, unspecified: Secondary | ICD-10-CM

## 2017-08-09 LAB — BASIC METABOLIC PANEL
BUN: 47 mg/dL — ABNORMAL HIGH (ref 6–23)
CHLORIDE: 101 meq/L (ref 96–112)
CO2: 32 meq/L (ref 19–32)
Calcium: 9.3 mg/dL (ref 8.4–10.5)
Creatinine, Ser: 1.88 mg/dL — ABNORMAL HIGH (ref 0.40–1.50)
GFR: 36.27 mL/min — ABNORMAL LOW (ref >60.00)
Glucose, Bld: 131 mg/dL — ABNORMAL HIGH (ref 70–99)
Potassium: 4.1 mEq/L (ref 3.5–5.1)
Sodium: 141 mEq/L (ref 135–145)

## 2017-08-09 NOTE — Patient Instructions (Addendum)
Thank you for coming in,   I will call you with results from today. We will refer you to the kidney doctor.   Please feel free to call with any questions or concerns at any time, at [phone]. --Dr. Raeford Razor

## 2017-08-09 NOTE — Progress Notes (Signed)
Andrew [name] - 83 y.o. male MRN [id number]  Date of birth: Oct 23, 1928  SUBJECTIVE:  Including CC & ROS.  Chief Complaint  Patient presents with  . Groin Pain    X1 month sharp constant pain that radiates from right side to left side   Andrew [name] is presenting with chronic kidney disease in follow-up for his abscess. He reports his abscess has completely healed and has no pain anymore. He denies any fevers or chills.  He has a history of chronic kidney disease that has been occurring for some time. He has never seen a kidney doctor. He denies any changes in his voiding. He does take Lasix and an ACE inhibitor.  Has no leg swelling. He does have a history of heart failure. He is followed by cardiology.     Review of Systems See history of present illness   HISTORY: Past Medical, Surgical, Social, and Family History Reviewed & Updated per EMR.   Pertinent Historical Findings include:  Past Medical History:  Diagnosis Date  . AAA (abdominal aortic aneurysm) ([place])   . Anemia   . Arthritis   . CAD (coronary artery disease)   . Cancer ([place])    Melanoma - Back  . Carotid artery stenosis   . CHF (congestive heart failure) ([place])   . Cyst of kidney, acquired   . Depression   . Diabetes mellitus   . DVT (deep venous thrombosis) ([place])   . Fatty liver 2008  . GERD (gastroesophageal reflux disease)   . Hyperlipidemia   . Hypertension   . IBS (irritable bowel syndrome)   . LVF (left ventricular failure) ([place])   . Paroxysmal atrial fibrillation (HCC)   . Pneumonia Jan. 2014  . Shortness of breath dyspnea   . Thyroid disease   . Typical atrial flutter ([place])   . Vitamin D deficiency     Past Surgical History:  Procedure Laterality Date  . ABDOMINAL AORTIC ANEURYSM REPAIR  2002  . CARDIAC CATHETERIZATION    . cardiac stents  2005  . CORONARY ARTERY BYPASS GRAFT  1992  . EYE SURGERY     [facility]  . JOINT REPLACEMENT     Bilateral knee arthroplasty  . LEFT HEART  CATHETERIZATION WITH CORONARY ANGIOGRAM N/A 10/30/2014   Procedure: LEFT HEART CATHETERIZATION WITH CORONARY ANGIOGRAM;  Surgeon: Larey Dresser, MD;  [facility] CATH LAB;  Service: Cardiovascular;  Laterality: N/A;  . TOTAL KNEE ARTHROPLASTY     bilateral- Pt fell 3 steps  . UTI  June 2016   While in [place]  2016    Allergies  Allergen Reactions  . Metoprolol Shortness Of Breath and Palpitations    Heart starts racing. Shallow breathing   . Metformin And Related Nausea And Vomiting    Family History  Problem Relation Age of Onset  . Colon cancer Father   . Coronary artery disease Brother   . Diabetes Brother   . Heart disease Brother   . Hyperlipidemia Brother   . Peripheral vascular disease Brother        Varicose Veins  . Throat cancer Brother        abdominal cancer?   . Diabetes Son   . Hyperlipidemia Son      Social History   Social History  . Marital status: Married    Spouse name: N/A  . Number of children: N/A  . Years of education: N/A   Occupational History  . Retired    Social History Main  Topics  . 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 Smokeless tobacco: Never Used     Comment: quit atleast 25 yrs ago, per pt  . Alcohol use 1.8 oz/week    3 Shots of liquor per week     Comment: socially  . Drug use: No  . Sexual activity: Yes   Other Topics Concern  . Not on file   Social History Narrative   Pt lives in [place] with spouse.   Retired from [employer].  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     PHYSICAL EXAM:  VS: BP 130/68 (BP Location: Left Arm, Patient Position: Sitting, Cuff Size: Normal)   Pulse 67   Temp 97.7 F (36.5 C) (Oral)   Ht 5\' 7"  (1.702 m)   Wt 192 lb (87.1 kg)   SpO2 98%   BMI 30.07 kg/m  Physical Exam Gen: NAD, alert, cooperative with exam, well-appearing ENT: normal lips, normal nasal mucosa,  Eye: normal EOM, normal conjunctiva and lids CV:  no edema, +2 pedal  pulses   Resp: no accessory muscle use, non-labored,  Skin: no rashes, no area of erythema over the prior abscess site. Some scar tissue formation. Neuro: normal tone, normal sensation to touch Psych:  normal insight, alert and oriented MSK: Normal gait, normal strength      ASSESSMENT & PLAN:   Abscess This seems to have resolved and he is feeling much better.  Stage 3 chronic kidney disease His creatinine has trended up and down after some time. I reviewed his last metabolic panel which showed an decrease in his creatinine. - We'll obtain BMP today - Refer to nephrology

## 2017-08-09 NOTE — Assessment & Plan Note (Signed)
This seems to have resolved and he is feeling much better.

## 2017-08-09 NOTE — Assessment & Plan Note (Signed)
His creatinine has trended up and down after some time. I reviewed his last metabolic panel which showed an decrease in his creatinine. - We'll obtain BMP today - Refer to nephrology

## 2017-08-22 ENCOUNTER — Ambulatory Visit (INDEPENDENT_AMBULATORY_CARE_PROVIDER_SITE_OTHER): Payer: Medicare Other | Admitting: Podiatry

## 2017-08-22 DIAGNOSIS — I739 Peripheral vascular disease, unspecified: Secondary | ICD-10-CM

## 2017-08-22 DIAGNOSIS — M79676 Pain in unspecified toe(s): Secondary | ICD-10-CM | POA: Diagnosis not present

## 2017-08-22 DIAGNOSIS — E1159 Type 2 diabetes mellitus with other circulatory complications: Secondary | ICD-10-CM

## 2017-08-22 DIAGNOSIS — B351 Tinea unguium: Secondary | ICD-10-CM

## 2017-08-22 NOTE — Progress Notes (Signed)
Patient ID: ZAKAR BROSCH, male   DOB: [birth year], 83 y.o.   MRN: [id number] Complaint:  Visit Type: Patient returns to my office for continued preventative foot care services. Complaint: Patient states" my nails have grown long and thick and become painful to walk and wear shoes" Patient has been diagnosed with DM with no foot complications. The patient presents for preventative foot care services. No changes to ROS  Podiatric Exam: Vascular: dorsalis pedis and posterior tibial pulses are palpable bilateral. Capillary return is immediate. Temperature gradient is WNL. Skin turgor WNL  Sensorium: Normal Semmes Weinstein monofilament test. Normal tactile sensation bilaterally. Nail Exam: Pt has thick disfigured discolored nails with subungual debris noted bilateral entire nail hallux through fifth toenails Ulcer Exam: There is no evidence of ulcer or pre-ulcerative changes or infection. Orthopedic Exam: Muscle tone and strength are WNL. No limitations in general ROM. No crepitus or effusions noted. Foot type and digits show no abnormalities. Bony prominences are unremarkable. 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 Skin: No Porokeratosis. No infection or ulcers  Diagnosis:  Onychomycosis, , Pain in right toe, pain in left toes  Treatment & Plan Procedures and Treatment: Consent by patient was obtained for treatment procedures. The patient understood the discussion of treatment and procedures well. All questions were answered thoroughly reviewed. Debridement of mycotic and hypertrophic toenails, 1 through 5 bilateral and clearing of subungual debris. No ulceration, no infection noted.  Return Visit-Office Procedure: Patient instructed to return to the office for a follow up visit 10 weeks. for continued evaluation and treatment.   Gardiner Barefoot DPM

## 2017-08-27 ENCOUNTER — Encounter (HOSPITAL_COMMUNITY): Payer: Self-pay | Admitting: *Deleted

## 2017-08-27 ENCOUNTER — Emergency Department (HOSPITAL_COMMUNITY): Payer: Medicare Other

## 2017-08-27 ENCOUNTER — Emergency Department (HOSPITAL_COMMUNITY)
Admission: EM | Admit: 2017-08-27 | Discharge: 2017-08-27 | Disposition: A | Discharge status: Home / Self Care | Payer: Medicare Other | Attending: Emergency Medicine | Admitting: Emergency Medicine

## 2017-08-27 DIAGNOSIS — I130 Hypertensive heart and chronic kidney disease with heart failure and stage 1 through stage 4 chronic kidney disease, or unspecified chronic kidney disease: Secondary | ICD-10-CM | POA: Diagnosis not present

## 2017-08-27 DIAGNOSIS — R109 Unspecified abdominal pain: Secondary | ICD-10-CM

## 2017-08-27 DIAGNOSIS — Z79899 Other long term (current) drug therapy: Secondary | ICD-10-CM | POA: Diagnosis not present

## 2017-08-27 DIAGNOSIS — E1122 Type 2 diabetes mellitus with diabetic chronic kidney disease: Secondary | ICD-10-CM | POA: Insufficient documentation

## 2017-08-27 DIAGNOSIS — E039 Hypothyroidism, unspecified: Secondary | ICD-10-CM | POA: Insufficient documentation

## 2017-08-27 DIAGNOSIS — Z85820 Personal history of malignant melanoma of skin: Secondary | ICD-10-CM | POA: Insufficient documentation

## 2017-08-27 DIAGNOSIS — N183 Chronic kidney disease, stage 3 (moderate): Secondary | ICD-10-CM | POA: Insufficient documentation

## 2017-08-27 DIAGNOSIS — I5022 Chronic systolic (congestive) heart failure: Secondary | ICD-10-CM | POA: Insufficient documentation

## 2017-08-27 DIAGNOSIS — Z87891 Personal history of nicotine dependence: Secondary | ICD-10-CM | POA: Diagnosis not present

## 2017-08-27 DIAGNOSIS — Z96653 Presence of artificial knee joint, bilateral: Secondary | ICD-10-CM | POA: Diagnosis not present

## 2017-08-27 DIAGNOSIS — N289 Disorder of kidney and ureter, unspecified: Secondary | ICD-10-CM | POA: Diagnosis not present

## 2017-08-27 DIAGNOSIS — I2510 Atherosclerotic heart disease of native coronary artery without angina pectoris: Secondary | ICD-10-CM | POA: Insufficient documentation

## 2017-08-27 LAB — I-STAT CHEM 8, ED
BUN: 44 mg/dL — AB (ref 6–20)
CALCIUM ION: 1.14 mmol/L — AB (ref 1.15–1.40)
Chloride: 104 mmol/L (ref 101–111)
Creatinine, Ser: 1.8 mg/dL — ABNORMAL HIGH (ref 0.61–1.24)
Glucose, Bld: 127 mg/dL — ABNORMAL HIGH (ref 65–99)
HEMATOCRIT: 34 % — AB (ref 39.0–52.0)
Hemoglobin: 11.6 g/dL — ABNORMAL LOW (ref 13.0–17.0)
Potassium: 4 mmol/L (ref 3.5–5.1)
SODIUM: 142 mmol/L (ref 135–145)
TCO2: 29 mmol/L (ref 22–32)

## 2017-08-27 LAB — URINALYSIS, ROUTINE W REFLEX MICROSCOPIC
BACTERIA UA: NONE SEEN
BILIRUBIN URINE: NEGATIVE
Glucose, UA: NEGATIVE mg/dL
Hgb urine dipstick: NEGATIVE
Ketones, ur: NEGATIVE mg/dL
Leukocytes, UA: NEGATIVE
Nitrite: NEGATIVE
PROTEIN: NEGATIVE mg/dL
SPECIFIC GRAVITY, URINE: 1.006 (ref 1.005–1.030)
SQUAMOUS EPITHELIAL / LPF: NONE SEEN
pH: 5 (ref 5.0–8.0)

## 2017-08-27 LAB — CBC WITH DIFFERENTIAL/PLATELET
Basophils Absolute: 0 10*3/uL (ref 0.0–0.1)
Basophils Relative: 0 %
EOS PCT: 4 %
Eosinophils Absolute: 0.2 10*3/uL (ref 0.0–0.7)
HEMATOCRIT: 35.7 % — AB (ref 39.0–52.0)
Hemoglobin: 11.9 g/dL — ABNORMAL LOW (ref 13.0–17.0)
LYMPHS ABS: 1.6 10*3/uL (ref 0.7–4.0)
LYMPHS PCT: 25 %
MCH: 32.5 pg (ref 26.0–34.0)
MCHC: 33.3 g/dL (ref 30.0–36.0)
MCV: 97.5 fL (ref 78.0–100.0)
MONO ABS: 0.6 10*3/uL (ref 0.1–1.0)
MONOS PCT: 9 %
Neutro Abs: 3.9 10*3/uL (ref 1.7–7.7)
Neutrophils Relative %: 62 %
PLATELETS: 148 10*3/uL — AB (ref 150–400)
RBC: 3.66 MIL/uL — ABNORMAL LOW (ref 4.22–5.81)
RDW: 13.3 % (ref 11.5–15.5)
WBC: 6.3 10*3/uL (ref 4.0–10.5)

## 2017-08-27 MED ORDER — TRAMADOL HCL 50 MG PO TABS: 50.0000 mg | ORAL_TABLET | Freq: Four times a day (QID) | ORAL | 0 refills | Status: DC | PRN

## 2017-08-27 NOTE — ED Triage Notes (Signed)
Patient is alert and oriented x4.  He is being seen for RLQ pain that started  A month ago.  Patient states that he was seen by his PCP and referred to [place] [facility].  He has tried to schedule an appointment but no one would return his calls.  Currently he rates his pain 7 of 10.

## 2017-08-27 NOTE — ED Provider Notes (Signed)
[facility] DEPT Provider Note   CSN: [id number] Arrival date & time: 08/27/17  1107     History   Chief Complaint Chief Complaint  Patient presents with  . Abdominal Pain    HPI Andrew [name] is a 83 y.o. male.  HPI Patient presents with right lower abdominal pain. Has had it for the last month. States he went to his primary care doctor's been referred to the kidney doctors. States it feels as if the "fluid is back.". He states he has had kidney problems before. States the fluid had to be drained. States it had like a ball and socket out the went through his abdomen. States was seen by neurology for. He cannot really give much history on it. This was a couple years ago. Reviewing records appears both he has had both a renal cyst that is repeatedly require draining and has had hydronephrosis on the right side. Primary care doctor was referring to [facility] for mildly elevated creatinine that has been improving. No fevers. No constipation. No dysuria. Past Medical History:  Diagnosis Date  . AAA (abdominal aortic aneurysm) ([place])   . Anemia   . Arthritis   . CAD (coronary artery disease)   . Cancer ([place])    Melanoma - Back  . Carotid artery stenosis   . CHF (congestive heart failure) ([place])   . Cyst of kidney, acquired   . Depression   . Diabetes mellitus   . DVT (deep venous thrombosis) ([place])   . Fatty liver 2008  . GERD (gastroesophageal reflux disease)   . Hyperlipidemia   . Hypertension   . IBS (irritable bowel syndrome)   . LVF (left ventricular failure) ([place])   . Paroxysmal atrial fibrillation ([facility])   . Pneumonia Jan. 2014  . Shortness of breath dyspnea   . Thyroid disease   . Typical atrial flutter ([place])   . Vitamin D deficiency     Patient Active Problem List   Diagnosis Date Noted  . Abscess 07/20/2017  . Calf pain 05/25/2016  . Carotid artery stenosis 01/12/2016  . Chronic systolic CHF (congestive heart failure) ([place]) 12/02/2015  . Cerebellar  infarct ([place]) 10/16/2015  . 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 PVD (peripheral vascular disease) ([place])   . Hereditary and idiopathic peripheral neuropathy 10/02/2015  . Peripelvic (lymphatic) cyst   . Pernicious anemia 07/15/2015  . Bilateral renal cysts 07/12/2015  . Lung nodule, 14mm RML CT 07/12/15 07/12/2015  . Constipation 07/12/2015  . Kidney stone on right side 07/12/2015  . Stage 3 chronic kidney disease 06/14/2015  . Chronic diastolic CHF (congestive heart failure) ([place]) 05/28/2015  . Paroxysmal atrial fibrillation ([place]) 05/28/2015  . Routine general medical examination at a health care facility 03/24/2015  . AAA (abdominal aortic aneurysm) without rupture ([place]) 09/30/2014  . Left-sided low back pain without sciatica 07/21/2014  . Cardiomyopathy, ischemic 05/23/2014  . DM (diabetes mellitus), type 2 with renal complications ([place]) 31/49/7026  . CORONARY ATHEROSCLEROSIS NATIVE CORONARY ARTERY 11/09/2010  . ANXIETY DEPRESSION 03/18/2010  . Hypothyroidism 02/03/2009  . Hyperlipidemia with target LDL less than 100 06/14/2007  . Essential hypertension 06/14/2007  . GERD 06/14/2007    Past Surgical History:  Procedure Laterality Date  . ABDOMINAL AORTIC ANEURYSM REPAIR  2002  . CARDIAC CATHETERIZATION    . cardiac stents  2005  . CORONARY ARTERY BYPASS GRAFT  1992  . EYE SURGERY     [facility]  . JOINT REPLACEMENT  Bilateral knee arthroplasty  . LEFT HEART CATHETERIZATION WITH CORONARY ANGIOGRAM N/A 10/30/2014   Procedure: LEFT HEART CATHETERIZATION WITH CORONARY ANGIOGRAM;  Surgeon: Larey Dresser, MD;  Location: [facility] CATH LAB;  Service: Cardiovascular;  Laterality: N/A;  . TOTAL KNEE ARTHROPLASTY     bilateral- Pt fell 3 steps  . UTI  June 2016   While in [place]  2016       Home Medications    Prior to Admission medications   Medication Sig Start Date End Date Taking? Authorizing Provider  ACCU-CHEK AVIVA PLUS test strip CHECK  BLOOD SUGAR 4 TIMES A DAY. 08/30/16  Yes Janith Lima, MD  acetaminophen (TYLENOL) 500 MG tablet Take 1,000 mg by mouth every 8 (eight) hours as needed for moderate pain.    Yes [provider]  ALPRAZolam (XANAX) 0.5 MG tablet TAKE 1/2 TABLET TWICE DAILY AS NEEDED FOR ANXIETY. 11/20/16  Yes Janith Lima, MD  amiodarone (PACERONE) 200 MG tablet TAKE 1 TABLET ONCE DAILY. 08/09/17  Yes Bensimhon, Shaune Pascal, MD  atorvastatin (LIPITOR) 80 MG tablet TAKE 1 TABLET ONCE DAILY. 05/02/16  Yes Larey Dresser, MD  carvedilol (COREG) 12.5 MG tablet Take 1 tablet (12.5 mg total) by mouth 2 (two) times daily. 04/21/17  Yes Larey Dresser, MD  cilostazol (PLETAL) 100 MG tablet TAKE 1 TABLET TWICE DAILY. 08/02/16  Yes Janith Lima, MD  ELIQUIS 2.5 MG TABS tablet TAKE 1 TABLET TWICE DAILY. 07/31/17  Yes Larey Dresser, MD  ezetimibe (ZETIA) 10 MG tablet TAKE (1) TABLET DAILY. 07/25/16  Yes Janith Lima, MD  FLUoxetine (PROZAC) 20 MG capsule TAKE (1) CAPSULE DAILY. 10/26/16  Yes Janith Lima, MD  furosemide (LASIX) 80 MG tablet Take 1 tablet (80 mg total) by mouth 2 (two) times daily. Patient taking differently: Take 80-120 mg by mouth 2 (two) times daily. 120 mg in am and 80 in evening 06/26/17  Yes Larey Dresser, MD  glucosamine-chondroitin 500-400 MG tablet Take 1 tablet by mouth daily.    Yes [provider]  losartan (COZAAR) 100 MG tablet TAKE 1 TABLET ONCE DAILY. 10/10/16  Yes Janith Lima, MD  Multiple Vitamin (MULTIVITAMIN) tablet Take 1 tablet by mouth daily.   Yes [provider]  tamsulosin (FLOMAX) 0.4 MG CAPS capsule Take 0.4 mg by mouth daily.   Yes [provider]  NITROSTAT 0.4 MG SL tablet DISSOLVE 1 TABLET UNDER TONGUE AS NEEDED FOR CHEST PAIN,MAY REPEAT IN5 MINUTES FOR 2 DOSES. 07/21/17   Larey Dresser, MD  temazepam (RESTORIL) 30 MG capsule TAKE 1 CAPSULE AT BEDTIME AS NEEDED FOR SLEEP. Patient not taking: Reported on 08/27/2017 09/14/15   Janith Lima, MD  traMADol (ULTRAM) 50 MG tablet Take 1 tablet (50 mg total) by mouth every 6 (six) hours as needed. 08/27/17   Davonna Belling, MD    Family History Family History  Problem Relation Age of Onset  . Colon cancer Father   . Coronary artery disease Brother   . Diabetes Brother   . Heart disease Brother   . Hyperlipidemia Brother   . Peripheral vascular disease Brother        Varicose Veins  . Throat cancer Brother        abdominal cancer?   . Diabetes Son   . Hyperlipidemia Son     Social History Social History  Substance Use Topics  . 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 Smokeless tobacco: Never Used  Comment: quit atleast 25 yrs ago, per pt  . Alcohol use 1.8 oz/week    3 Shots of liquor per week     Comment: socially     Allergies   Metoprolol and Metformin and related   Review of Systems Review of Systems  Constitutional: Negative for appetite change.  HENT: Negative for congestion.   Respiratory: Negative for shortness of breath.   Cardiovascular: Negative for chest pain.  Gastrointestinal: Positive for abdominal pain.  Genitourinary: Negative for dysuria, frequency and genital sores.  Musculoskeletal: Negative for back pain.  Neurological: Negative for seizures.  Hematological: Negative for adenopathy.  Psychiatric/Behavioral: Negative for confusion.     Physical Exam Updated Vital Signs BP (!) 150/74 (BP Location: Right Arm)   Pulse 78   Temp 98.1 F (36.7 C) (Oral)   Resp 16   Ht 5\' 7"  (1.702 m)   Wt 86.2 kg (190 lb)   SpO2 98%   BMI 29.76 kg/m   Physical Exam  Constitutional: He appears well-developed.  HENT:  Head: Atraumatic.  Eyes: Pupils are equal, round, and reactive to light.  Neck: Neck supple.  Cardiovascular: Normal rate.   Pulmonary/Chest: Effort normal.  Abdominal: Soft. He exhibits no mass. There is no tenderness.  Genitourinary: Penis normal.  Genitourinary Comments: Testicular tenderness.  Musculoskeletal: He  exhibits no edema.  Neurological: He is alert.  Skin: Skin is warm. Capillary refill takes less than 2 seconds.     ED Treatments / Results  Labs (all labs ordered are listed, but only abnormal results are displayed) Labs Reviewed  CBC WITH DIFFERENTIAL/PLATELET - Abnormal; Notable for the following:       Result Value   RBC 3.66 (*)    Hemoglobin 11.9 (*)    HCT 35.7 (*)    Platelets 148 (*)    All other components within normal limits  URINALYSIS, ROUTINE W REFLEX MICROSCOPIC - Abnormal; Notable for the following:    Color, Urine STRAW (*)    All other components within normal limits  I-STAT CHEM 8, ED - Abnormal; Notable for the following:    BUN 44 (*)    Creatinine, Ser 1.80 (*)    Glucose, Bld 127 (*)    Calcium, Ion 1.14 (*)    Hemoglobin 11.6 (*)    HCT 34.0 (*)    All other components within normal limits    EKG  EKG Interpretation None       Radiology Ct Renal Stone Study  Result Date: 08/27/2017 CLINICAL DATA:  Right lower quadrant abdomen pain starting 1 month ago. EXAM: CT ABDOMEN AND PELVIS WITHOUT CONTRAST TECHNIQUE: Multidetector CT imaging of the abdomen and pelvis was performed following the standard protocol without IV contrast. COMPARISON:  July 12, 2015 FINDINGS: Lower chest: Mild dependent atelectasis of posterior lung bases are noted. 6 mm nodule in the right middle lobe is unchanged compared to July of 2016. Two year stability is established. The heart size is normal. Hepatobiliary: No focal liver abnormality is seen. No gallstones, gallbladder wall thickening, or biliary dilatation. Pancreas: Unremarkable. No pancreatic ductal dilatation or surrounding inflammatory changes. Spleen: Normal in size without focal abnormality. Adrenals/Urinary Tract: The adrenal glands are normal. Bilateral kidney low-density cysts are identified. There are bilateral hyperdense renal lesions present in 2016 likely hemorrhagic cysts. Bilateral renal vascular calcifications  are noted. There is no hydronephrosis bilaterally. The bladder is partially decompressed with diffuse thickened wall. Stomach/Bowel: Stomach is within normal limits. Appendix appears normal. No evidence of bowel  wall thickening, distention, or inflammatory changes. Vascular/Lymphatic: Aortic atherosclerosis. Small infrarenal abdominal aortic aneurysm measuring 3.1 cm is unchanged. No abdominal lymphadenopathy is noted. Reproductive: Prostate is unremarkable. Other: Bilateral inguinal herniation of mesenteric fat is noted. Musculoskeletal: Degenerative joint changes are noted throughout the lumbar spine. Bilateral pars defects of L5 are noted, unchanged. IMPRESSION: Normal appendix.  No bowel obstruction. Kidney cysts unchanged compared prior exam. Electronically Signed   By: Abelardo Diesel M.D.   On: 08/27/2017 16:27    Procedures Procedures (including critical care time)  Medications Ordered in ED Medications - No data to display   Initial Impression / Assessment and Plan / ED Course  I have reviewed the triage vital signs and the nursing notes.  Pertinent labs & imaging results that were available during my care of the patient were reviewed by me and considered in my medical decision making (see chart for details).     Patient with nonspecific abdominal pain. Has had it for the last month. CAT scan reassuring. Benign exam. Kidney function mildly elevated. CT scan done and stable to before. No hydronephrosis and stable renal cysts. Will need to follow with PCP and kidney as planned.  Final Clinical Impressions(s) / ED Diagnoses   Final diagnoses:  Abdominal pain, unspecified abdominal location  Renal insufficiency    New Prescriptions New Prescriptions   TRAMADOL (ULTRAM) 50 MG TABLET    Take 1 tablet (50 mg total) by mouth every 6 (six) hours as needed.     Davonna Belling, MD 08/27/17 [id number]

## 2017-08-27 NOTE — ED Notes (Signed)
MD at bedside. 

## 2017-08-29 ENCOUNTER — Encounter (HOSPITAL_COMMUNITY): Payer: Medicare Other

## 2017-08-29 ENCOUNTER — Ambulatory Visit: Payer: Medicare Other | Admitting: Family

## 2017-08-30 LAB — HM DIABETES EYE EXAM

## 2017-08-31 ENCOUNTER — Encounter: Payer: Self-pay | Admitting: Family Medicine

## 2017-08-31 DIAGNOSIS — Z006 Encounter for examination for normal comparison and control in clinical research program: Secondary | ICD-10-CM

## 2017-08-31 NOTE — Progress Notes (Signed)
Patient here for [place] Heart Failure Study Visit 9. He states he feels great from a heart failure standpoint. He has experienced some low abdominal pain for which he will see his PCP. He states he went to the emergency room and was told it was "nothing serious" and he should follow up with his PCP. He states "in the past I have had benign cyst on both kidney's, perhaps they are flaring up." He verbalized excellent IP compliance and good diet compliance. All aspects of study visit completed. Next research appointment Dec 27th @ 11am.

## 2017-08-31 NOTE — Progress Notes (Signed)
Abstracted and sent to scan  

## 2017-09-03 ENCOUNTER — Other Ambulatory Visit: Payer: Self-pay | Admitting: Internal Medicine

## 2017-09-03 ENCOUNTER — Other Ambulatory Visit (HOSPITAL_COMMUNITY): Payer: Self-pay | Admitting: Cardiology

## 2017-09-05 ENCOUNTER — Telehealth: Payer: Self-pay | Admitting: Family Medicine

## 2017-09-05 NOTE — Telephone Encounter (Signed)
Discussed with patient about his kidney issue. We will continue to monitor for now. His abdominal pain is his main concern.   Rosemarie Ax, MD [facility] 09/05/2017, 2:32 PM

## 2017-09-11 ENCOUNTER — Ambulatory Visit (INDEPENDENT_AMBULATORY_CARE_PROVIDER_SITE_OTHER): Payer: Medicare Other | Admitting: Family Medicine

## 2017-09-11 ENCOUNTER — Encounter: Payer: Self-pay | Admitting: Family Medicine

## 2017-09-11 VITALS — BP 122/56 | HR 76 | Temp 97.8°F | Ht 67.0 in | Wt 191.8 lb

## 2017-09-11 DIAGNOSIS — R1030 Lower abdominal pain, unspecified: Secondary | ICD-10-CM | POA: Diagnosis not present

## 2017-09-11 DIAGNOSIS — E1122 Type 2 diabetes mellitus with diabetic chronic kidney disease: Secondary | ICD-10-CM | POA: Diagnosis not present

## 2017-09-11 MED ORDER — TRAMADOL HCL 50 MG PO TABS: 50.0000 mg | ORAL_TABLET | Freq: Four times a day (QID) | ORAL | 0 refills | Status: DC | PRN

## 2017-09-11 NOTE — Assessment & Plan Note (Signed)
CT scan did show hernia of the mesenteric fat. Unclear if this is the source of his pain. The aortic aneurysm is unchanged from prior studies. Previous urinalysis did not show infection. CT did not show a stone. - Provided a refill on his tramadol today - Referral to surgery

## 2017-09-11 NOTE — Progress Notes (Signed)
Andrew [name] - 83 y.o. male MRN [id number]  Date of birth: 26-Mar-1928  SUBJECTIVE:  Including CC & ROS.  Chief Complaint  Patient presents with  . Abdominal Pain    Patient is here today C/O lower abd pain that is worse in RLQ.  Has been going on for 3-4 weeks now and states that he has been told that he has a small cyst on both kidneys but should not cause pain and were harmelss.  Denies any bloating, painful urination, or radiating to back. He has run out of Tramadol which helped to control the pain when he takes them at 1/2 tablet at a time.  Is requesting a refill.    Andrew [name] is an 83 year old male that is presenting with lower quadrant abdominal pain. He reports the pain has been present for about 2-3 weeks. He denies the pain being worse with food or movement. He reports the pain is constant and roughly a 3 out of 10. He was seen in the emergency department on 9/2. There is a nonspecific abdominal pain diagnosis at that time. He denies any trauma to his abdomen. Urinalysis from that visit did not show any infection. He denies any fevers or chills. The pain is dull in nature. He reports normal bowel movements. He has a history of kidney stones and this does not feel like that.   Review of his visit from the emergency department on 9/2 shows normal white count, some mild anemia, and improvement of his serum creatinine.  Review of his CT shows normal appendix and no bowel obstruction and kidney cysts that are unchanged from prior exam. It does show bilateral inguinal herniation of mesenteric fat.   Wife reports that he had an episode earlier last week where he was out of it. This lasted for about 30 minutes. He has had a history of a stroke. He has not had any more episodes of this.  Review of Systems  Constitutional: Negative for fever.  Gastrointestinal: Positive for abdominal pain. Negative for abdominal distention.  Musculoskeletal: Negative for gait problem.  Neurological: Negative  for weakness and numbness.    HISTORY: Past Medical, Surgical, Social, and Family History Reviewed & Updated per EMR.   Pertinent Historical Findings include:  Past Medical History:  Diagnosis Date  . AAA (abdominal aortic aneurysm) ([place])   . Anemia   . Arthritis   . CAD (coronary artery disease)   . Cancer ([place])    Melanoma - Back  . Carotid artery stenosis   . CHF (congestive heart failure) ([place])   . Cyst of kidney, acquired   . Depression   . Diabetes mellitus   . DVT (deep venous thrombosis) ([place])   . Fatty liver 2008  . GERD (gastroesophageal reflux disease)   . Hyperlipidemia   . Hypertension   . IBS (irritable bowel syndrome)   . LVF (left ventricular failure) ([place])   . Paroxysmal atrial fibrillation ([facility])   . Pneumonia Jan. 2014  . Shortness of breath dyspnea   . Thyroid disease   . Typical atrial flutter ([place])   . Vitamin D deficiency     Past Surgical History:  Procedure Laterality Date  . ABDOMINAL AORTIC ANEURYSM REPAIR  2002  . CARDIAC CATHETERIZATION    . cardiac stents  2005  . CORONARY ARTERY BYPASS GRAFT  1992  . EYE SURGERY     [facility]  . JOINT REPLACEMENT     Bilateral knee arthroplasty  . LEFT HEART CATHETERIZATION WITH  CORONARY ANGIOGRAM N/A 10/30/2014   Procedure: LEFT HEART CATHETERIZATION WITH CORONARY ANGIOGRAM;  Surgeon: Larey Dresser, MD;  Location: [facility] CATH LAB;  Service: Cardiovascular;  Laterality: N/A;  . TOTAL KNEE ARTHROPLASTY     bilateral- Pt fell 3 steps  . UTI  June 2016   While in [place]  2016    Allergies  Allergen Reactions  . Metoprolol Shortness Of Breath and Palpitations    Heart starts racing. Shallow breathing   . Metformin And Related Nausea And Vomiting    Family History  Problem Relation Age of Onset  . Colon cancer Father   . Coronary artery disease Brother   . Diabetes Brother   . Heart disease Brother   . Hyperlipidemia Brother   . Peripheral vascular disease Brother        Varicose Veins  . Throat  cancer Brother        abdominal cancer?   . Diabetes Son   . Hyperlipidemia Son      Social History   Social History  . Marital status: Married    Spouse name: N/A  . Number of children: N/A  . Years of education: N/A   Occupational History  . Retired    Social History Main Topics  . 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 Smokeless tobacco: Never Used     Comment: quit atleast 25 yrs ago, per pt  . Alcohol use 1.8 oz/week    3 Shots of liquor per week     Comment: socially  . Drug use: No  . Sexual activity: Yes   Other Topics Concern  . Not on file   Social History Narrative   Pt lives in [place] with spouse.   Retired from [employer].  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     PHYSICAL EXAM:  VS: BP (!) 122/56 (BP Location: Left Arm, Patient Position: Sitting, Cuff Size: Large)   Pulse 76   Temp 97.8 F (36.6 C) (Oral)   Ht 5\' 7"  (1.702 m)   Wt 191 lb 12.8 oz (87 kg)   SpO2 94%   BMI 30.04 kg/m  Physical Exam Gen: NAD, alert, cooperative with exam, well-appearing ENT: normal lips, normal nasal mucosa,  Eye: normal EOM, normal conjunctiva and lids CV:  no edema, +2 pedal pulses, S1-S2, regular rate and rhythm   Resp: no accessory muscle use, non-labored, clear to auscultation bilaterally GI: no masses or tenderness, no hernia, soft, nondistended, positive bowel sounds  Skin: no rashes, no areas of induration  Neuro: normal tone, normal sensation to touch Psych:  normal insight, alert and oriented MSK: Normal gait, normal strength      ASSESSMENT & PLAN:   Lower abdominal pain CT scan did show hernia of the mesenteric fat. Unclear if this is the source of his pain. The aortic aneurysm is unchanged from prior studies. Previous urinalysis did not show infection. CT did not show a stone. - Provided a refill on his tramadol today - Referral to surgery   DM (diabetes mellitus), type 2 with renal  complications He had an episode no sounds to be related to hypoglycemia or hyperglycemia. Does not sound like a TIA event but possible. He is not currently on any medications. His last A1c from 2016 was 6.8. - Advised to take his blood sugar when this occurs.

## 2017-09-11 NOTE — Patient Instructions (Signed)
Thank you for coming in,   I have made a referral to surgery. Please let us know if you don't hear from them.    Please feel free to call with any questions or concerns at any time, at [phone]. --Dr. Raeford Razor

## 2017-09-11 NOTE — Assessment & Plan Note (Addendum)
He had an episode no sounds to be related to hypoglycemia or hyperglycemia. Does not sound like a TIA event but possible. He is not currently on any medications. His last A1c from 2016 was 6.8. - Advised to take his blood sugar when this occurs.

## 2017-09-22 ENCOUNTER — Encounter (HOSPITAL_COMMUNITY): Payer: Medicare Other | Admitting: Cardiology

## 2017-09-25 ENCOUNTER — Ambulatory Visit (HOSPITAL_COMMUNITY)
Admission: RE | Admit: 2017-09-25 | Discharge: 2017-09-25 | Disposition: A | Discharge status: Home / Self Care | Payer: Medicare Other | Source: Ambulatory Visit | Attending: Cardiology | Admitting: Cardiology

## 2017-09-25 ENCOUNTER — Encounter (HOSPITAL_COMMUNITY): Payer: Self-pay | Admitting: Cardiology

## 2017-09-25 VITALS — BP 112/86 | HR 85 | Wt 193.5 lb

## 2017-09-25 DIAGNOSIS — J449 Chronic obstructive pulmonary disease, unspecified: Secondary | ICD-10-CM | POA: Diagnosis not present

## 2017-09-25 DIAGNOSIS — M1990 Unspecified osteoarthritis, unspecified site: Secondary | ICD-10-CM | POA: Diagnosis not present

## 2017-09-25 DIAGNOSIS — I130 Hypertensive heart and chronic kidney disease with heart failure and stage 1 through stage 4 chronic kidney disease, or unspecified chronic kidney disease: Secondary | ICD-10-CM | POA: Diagnosis not present

## 2017-09-25 DIAGNOSIS — I714 Abdominal aortic aneurysm, without rupture: Secondary | ICD-10-CM | POA: Insufficient documentation

## 2017-09-25 DIAGNOSIS — I2582 Chronic total occlusion of coronary artery: Secondary | ICD-10-CM | POA: Insufficient documentation

## 2017-09-25 DIAGNOSIS — I480 Paroxysmal atrial fibrillation: Secondary | ICD-10-CM | POA: Diagnosis not present

## 2017-09-25 DIAGNOSIS — E1151 Type 2 diabetes mellitus with diabetic peripheral angiopathy without gangrene: Secondary | ICD-10-CM | POA: Diagnosis not present

## 2017-09-25 DIAGNOSIS — K589 Irritable bowel syndrome without diarrhea: Secondary | ICD-10-CM | POA: Diagnosis not present

## 2017-09-25 DIAGNOSIS — I4892 Unspecified atrial flutter: Secondary | ICD-10-CM | POA: Diagnosis not present

## 2017-09-25 DIAGNOSIS — N183 Chronic kidney disease, stage 3 (moderate): Secondary | ICD-10-CM | POA: Insufficient documentation

## 2017-09-25 DIAGNOSIS — Z87891 Personal history of nicotine dependence: Secondary | ICD-10-CM | POA: Insufficient documentation

## 2017-09-25 DIAGNOSIS — I2510 Atherosclerotic heart disease of native coronary artery without angina pectoris: Secondary | ICD-10-CM | POA: Insufficient documentation

## 2017-09-25 DIAGNOSIS — Z951 Presence of aortocoronary bypass graft: Secondary | ICD-10-CM | POA: Diagnosis not present

## 2017-09-25 DIAGNOSIS — E785 Hyperlipidemia, unspecified: Secondary | ICD-10-CM | POA: Insufficient documentation

## 2017-09-25 DIAGNOSIS — I5022 Chronic systolic (congestive) heart failure: Secondary | ICD-10-CM

## 2017-09-25 DIAGNOSIS — Z9889 Other specified postprocedural states: Secondary | ICD-10-CM | POA: Insufficient documentation

## 2017-09-25 DIAGNOSIS — I252 Old myocardial infarction: Secondary | ICD-10-CM | POA: Diagnosis not present

## 2017-09-25 DIAGNOSIS — Z8673 Personal history of transient ischemic attack (TIA), and cerebral infarction without residual deficits: Secondary | ICD-10-CM | POA: Diagnosis not present

## 2017-09-25 DIAGNOSIS — E1122 Type 2 diabetes mellitus with diabetic chronic kidney disease: Secondary | ICD-10-CM | POA: Insufficient documentation

## 2017-09-25 DIAGNOSIS — I255 Ischemic cardiomyopathy: Secondary | ICD-10-CM | POA: Insufficient documentation

## 2017-09-25 DIAGNOSIS — Z85820 Personal history of malignant melanoma of skin: Secondary | ICD-10-CM | POA: Diagnosis not present

## 2017-09-25 DIAGNOSIS — E038 Other specified hypothyroidism: Secondary | ICD-10-CM

## 2017-09-25 DIAGNOSIS — K219 Gastro-esophageal reflux disease without esophagitis: Secondary | ICD-10-CM | POA: Diagnosis not present

## 2017-09-25 DIAGNOSIS — I6529 Occlusion and stenosis of unspecified carotid artery: Secondary | ICD-10-CM | POA: Diagnosis not present

## 2017-09-25 DIAGNOSIS — Z7902 Long term (current) use of antithrombotics/antiplatelets: Secondary | ICD-10-CM | POA: Insufficient documentation

## 2017-09-25 DIAGNOSIS — Z8249 Family history of ischemic heart disease and other diseases of the circulatory system: Secondary | ICD-10-CM | POA: Insufficient documentation

## 2017-09-25 LAB — COMPREHENSIVE METABOLIC PANEL
ALBUMIN: 3.8 g/dL (ref 3.5–5.0)
ALT: 28 U/L (ref 17–63)
ANION GAP: 9 (ref 5–15)
AST: 35 U/L (ref 15–41)
Alkaline Phosphatase: 48 U/L (ref 38–126)
BUN: 27 mg/dL — ABNORMAL HIGH (ref 6–20)
CO2: 28 mmol/L (ref 22–32)
Calcium: 9 mg/dL (ref 8.9–10.3)
Chloride: 101 mmol/L (ref 101–111)
Creatinine, Ser: 1.69 mg/dL — ABNORMAL HIGH (ref 0.61–1.24)
GFR calc Af Amer: 40 mL/min — ABNORMAL LOW (ref >60)
GFR calc non Af Amer: 35 mL/min — ABNORMAL LOW (ref >60)
GLUCOSE: 101 mg/dL — AB (ref 65–99)
POTASSIUM: 3.7 mmol/L (ref 3.5–5.1)
SODIUM: 138 mmol/L (ref 135–145)
TOTAL PROTEIN: 6.9 g/dL (ref 6.5–8.1)
Total Bilirubin: 0.7 mg/dL (ref 0.3–1.2)

## 2017-09-25 LAB — TSH: TSH: 126.043 u[IU]/mL — AB (ref 0.350–4.500)

## 2017-09-25 NOTE — Patient Instructions (Signed)
Labs drawn today (if we do not call you, then your lab work was stable)    Your physician recommends that you schedule a follow-up appointment in: 3 months   

## 2017-09-26 ENCOUNTER — Ambulatory Visit (HOSPITAL_COMMUNITY)
Admission: RE | Admit: 2017-09-26 | Discharge: 2017-09-26 | Disposition: A | Discharge status: Home / Self Care | Payer: Medicare Other | Source: Ambulatory Visit | Attending: Cardiology | Admitting: Cardiology

## 2017-09-26 ENCOUNTER — Telehealth (HOSPITAL_COMMUNITY): Payer: Self-pay

## 2017-09-26 DIAGNOSIS — E0590 Thyrotoxicosis, unspecified without thyrotoxic crisis or storm: Secondary | ICD-10-CM

## 2017-09-26 LAB — TSH: TSH: 139.264 u[IU]/mL — ABNORMAL HIGH (ref 0.350–4.500)

## 2017-09-26 LAB — T4, FREE: Free T4: 0.41 ng/dL — ABNORMAL LOW (ref 0.61–1.12)

## 2017-09-26 NOTE — Telephone Encounter (Signed)
Notes recorded by Shirley Muscat, RN on 09/26/2017 at 10:34 AM EDT Pt aware of results and agreeable to decrease amiodarone to 100 mg. Pt has apt to have labs done at 1330 09/26/17. ------  Notes recorded by Larey Dresser, MD on 09/26/2017 at 8:29 AM EDT TSH is very high. Suggestive of hypothyroidism. He needs repeat TSH along with free T3 and T4. Have him come in today for repeat, will need to start Levoxyl but would recommend coordinating this with his PCP, Dr. Raeford Razor who will need to follow this long-term. This may be related to amiodarone.

## 2017-09-26 NOTE — Addendum Note (Signed)
Encounter addended by: Larey Dresser, MD on: 09/26/2017  8:49 AM<BR>    Actions taken: Sign clinical note

## 2017-09-26 NOTE — Progress Notes (Addendum)
Patient ID: Andrew [name], male   DOB: 11/27/1928, 83 y.o.   MRN: [id number] PCP: Dr. Raeford Razor Cardiology: Dr. [name]   83 y.o. with history of CKD, CAD s/p CABG, and ischemic cardiomyopathy with primarily diastolic CHF presents for followup of CHF and CAD.  He has a history of PAD followed at [facility].  I took him for [facility] in 11/15. 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  He had had occlusion of a relatively small LAD, there were right to left collaterals and no intervention was done (medical management).    He and his wife went on a cruise along the [employer] in 5/16.  He admits to considerable dietary indiscretion (high sodium diet).  It appears that he developed a hypertensive crisis along with chest pain while on the ship. He went into atrial fibrillation and developed CHF.  He was taken to the hospital in [place], [place].  He was in the ICU for about a week on Bipap intermittently.  Troponin peaked at 0.09 during this admission.  He was diuresed and after about 2 wks left the hospital and was able to fly home.    Cardiolite (6/16) showed EF 42%, prior anterolateral MI, no ischemia. Echo in 10/16 with EF 40-45%.    He was admitted on 07/06/15 for RLQ pain and fever.  He was treated for UTI and had a kidney stone as well.  He developed SOB after IVF were given for AKI.  He was treated for acute COPD exacerbation as well as CHF decompensation.  He was diuresed with IV lasix.  His weight on admission was 179 and got as high as 188 after IVF. Discharge weight was 181.  He was admitted in 10/16 with a cerebellar CVA.  He has some resultant imbalance.   He was again admitted in 11/16 with acute on chronic systolic CHF.  This was a short admission that appeared to be precipitated by a sodium load from eating at a [employer] .   3/18 Echo showed stable EF 40-45%.  He was also having chest pain so I did a Cardiolite in 3/18, no ischemia.   He has stable calf cramping after walking about a  block.  No pedal ulcers.  He saw [facility] recently with progression of disease by ABIs but given stable symptoms, plan for medical management.  No chest pain.  No dyspnea walking on flat ground or up a flight of steps.  No orthopnea/PND.  +Bendopnea.  Has slept in [place] for a long time because of back pain.  Weight up about 4 lbs.    ECG (personally reviewed): NSR, 1st degree AV block, IVCD 136 msec  Labs (12/14): K 4.8, creatinine 1.5 Labs (5/15): LDL 92 Labs (11/15): K 4.1, creatinine 1.5 Labs (12/15): K 4, creatinine 1.5 Labs (1/16): LFTs normal, LDL 51, HDL 47 Labs (5/16): TnI 0.09 Labs (6/16): K 4.8 => 4.4, creatinine 1.79 => 2.26, HCT 28.7 Labs (7/16) K 4.2, creatinine 1.98, LFTs normal, TSH normal Labs (8/16): HCT 27.1 Labs (9/16): hgb 8.1, K 4.3, creatinine 1.8, LFTs normal, TSH elevated Labs (10/16): LDL 74, HDL 45, TSH 5.19 (increased), free T3 and free T4 normal.  Labs (11/16): K 3.5, creatinine 1.47, AST 84, ALT 89 Labs (12/16): K 4.3, creatinine 1.58, AST 43, ALT 42 Labs (3/17): K 4.2, creatinine 1.4, hgb 10.7, AST 36, ALT 45 Labs (6/17): K 4, creatinine 1.69, hgb 10.9, proBNP 389, TSH mildly elevated Labs (8/17): Free T4 and T3  normal, TSH mildly elevated, K 4.2, creatinine 1.7, LFTs normal, hgb 10.4 Labs (12/17): TSH elevated but free T3 and T4 normal, K 4.5, creatinine 1.9, LDL 45, HDL 39, LFTs normal, HCT 32.9 Labs (1/18): K 4.4, creatinine 1.93 Labs (3/18): K 4.3, creatinine 1.87, BNP 187, hgb 11.1, LDL 75, LFTs normal Labs (4/18): K 4.1, creatinine 1.7 Labs (9/18): K 4, creatinine 1.8, hgb 11.9  PMH: 1. CKD 2. HTN 3. Hyperlipidemia 4. AAA: s/p surgery with left renal artery bypass in 1997 5. GERD 6. IBS 7. Melanoma s/p reception 8. Diabetes: Diet-controlled 9. Carotid stenosis: Carotid dopplers (4/15) with 40-59% bilateral stenosis. Carotid dopplers (7/16) with < 40% BICA stenosis.  Carotid dopplers (10/16) with 40-59% BICA stenosis.  - Carotids (8/17) with  minimal disease.  10. PAD: 9/14 ABIs 0.91 right 1.09 left.  7/16 ABIs 0.86 right, 1.1 left; aortoiliac duplex with < 50% bilateral iliac stenosis.  - Aortoiliac duplex (8/17) with < 50% bilateral iliac stenosis.   - ABIs (10/17): left 0.91, right 0.98, left TBI 0.44 (abnormal).  - ABIs (7/18): right 0.97, right 0.75 11. CAD: Has super-dominant RCA. s/p CABG 1992.  He had Taxus DES to mid PDA in 5/02. LHC (1/05) with 90% ostial PDA (had DES to this vessel), totally occluded RIMA-PDA, totally occluded PLV, sequential SVG-PLV with 1 branch occluded.  LHC (11/15) with left main off the right cusp, 40-50% ostial left main, total occlusion of the proximal LAD (small vessel) with right to left collaterals, large super-dominant RCA with 50% ISR mid RCA, 50% ostial PDA, patent SVG-PLV, RIMA-PDA known atretic. Lexiscan Cardiolite (6/16) with EF 42%, prior anterolateral MI, no ischemia.  - Lexiscan Cardiolite (3/18): EF 51%, basal to mid anterolateral fixed defect with no ischemia.  12. Ischemic cardiomyopathy: Echo (12/09) with EF 45-50%, basal to mid inferolateral hypokinesis. Echo (6/15) with EF 45-50%, akinesis of the mid to apical inferolateral wall.  Echo (6/16) with EF 50-55%, mild LVH, inferior hypokinesis, mild MR.  Echo (10/16) with EF 40-45%, moderate LVH, mildly decreased RV systolic function.  - Echo (3/18) with EF 40-45%, mid anterior hypokinesis.  13. OA 14. Atrial fibrillation/flutter: Paroxysmal.  Noted during 5/16 hospitalization in [place] and in 6/16.  He developed atrial flutter in 8/16, back in NSR by 9/16.  15. Emphysema: PFTs (8/16) with FVC 88%, FEV1 74%, ratio 81%, TLC 83%, DLCO 31%.  16. Renal cysts 17. Idiopathic peripheral neuropathy.  18. Anemia.  87. 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  FH: Brother with CAD.   ROS: All systems reviewed and negative except as per HPI.   Current Outpatient  Prescriptions  Medication Sig Dispense Refill  . ACCU-CHEK AVIVA PLUS test strip CHECK BLOOD SUGAR 4 TIMES A DAY. 100 each 11  . acetaminophen (TYLENOL) 500 MG tablet Take 1,000 mg by mouth every 8 (eight) hours as needed for moderate pain.     Marland Kitchen ALPRAZolam (XANAX) 0.5 MG tablet TAKE 1/2 TABLET TWICE DAILY AS NEEDED FOR ANXIETY. 60 tablet 5  . amiodarone (PACERONE) 200 MG tablet TAKE 1 TABLET ONCE DAILY. 30 tablet 3  . atorvastatin (LIPITOR) 80 MG tablet TAKE 1 TABLET ONCE DAILY. 90 tablet 3  . carvedilol (COREG) 12.5 MG tablet Take 1 tablet (12.5 mg total) by mouth 2 (two) times daily. 60 tablet 3  . cilostazol (PLETAL) 100 MG tablet TAKE 1 TABLET TWICE DAILY. 180 tablet 3  . ELIQUIS 2.5 MG TABS tablet  TAKE 1 TABLET TWICE DAILY. 60 tablet 3  . ezetimibe (ZETIA) 10 MG tablet TAKE (1) TABLET DAILY. 90 tablet 3  . FLUoxetine (PROZAC) 20 MG capsule TAKE (1) CAPSULE DAILY. 90 capsule 3  . furosemide (LASIX) 80 MG tablet Take by mouth as directed. 120 mg in am and 80 in evening    . glucosamine-chondroitin 500-400 MG tablet Take 1 tablet by mouth daily.     Marland Kitchen losartan (COZAAR) 100 MG tablet TAKE 1 TABLET ONCE DAILY. 90 tablet 3  . Multiple Vitamin (MULTIVITAMIN) tablet Take 1 tablet by mouth daily.    Marland Kitchen NITROSTAT 0.4 MG SL tablet DISSOLVE 1 TABLET UNDER TONGUE AS NEEDED FOR CHEST PAIN,MAY REPEAT IN5 MINUTES FOR 2 DOSES. 25 tablet 6  . tamsulosin (FLOMAX) 0.4 MG CAPS capsule Take 0.4 mg by mouth daily.    . temazepam (RESTORIL) 30 MG capsule TAKE 1 CAPSULE AT BEDTIME AS NEEDED FOR SLEEP. 30 capsule 3  . traMADol (ULTRAM) 50 MG tablet Take 1 tablet (50 mg total) by mouth every 6 (six) hours as needed. 12 tablet 0   No current facility-administered medications for this encounter.    BP 112/86   Pulse 85   Wt 193 lb 8 oz (87.8 kg)   SpO2 94%   BMI 30.31 kg/m   General: NAD Neck: No JVD, no thyromegaly or thyroid nodule.  Lungs: Clear to auscultation bilaterally with normal respiratory  effort. CV: Nondisplaced PMI.  Heart regular S1/S2, no S3/S4, 1/6 SEM RUSB. 1+ edema 1/2 to knee on left, 1+ right ankle edema.  No carotid bruit.  Normal pedal pulses.  Abdomen: Soft, nontender, no hepatosplenomegaly, no distention.  Skin: Intact without lesions or rashes.  Neurologic: Alert and oriented x 3.  Psych: Normal affect. Extremities: No clubbing or cyanosis.  HEENT: Normal.   Assessment/Plan:  1. CAD: s/p CABG.  Cath in 11/15 showed occluded LAD with left to right collaterals.  The LAD was a relatively small vessel (super-dominant right).  Managed medically. Lexiscan Cardiolite in 6/16 with infarction but no ischemia.  Cardiolite 3/18 with infarction, no ischemia. No chest pain.  - Continue ASA 81.   - Continue statin. - Continue Coreg.   2. Ischemic cardiomyopathy with systolic CHF: EF 81-19% on 3/18 echo.  NYHA class II symptoms.  He looks near-euvolemic.  - Continue current losartan 100 mg daily  - Continue Coreg 12.5 mg bid.    - Continue Lasix 120 qam/80 qpm.  BMET today.  3. Carotid stenosis: Stable, followed at [facility].  4. PAD: Stable claudication, worsening of ABIs in 7/18.  No pedal ulcers.  Seen by [facility], plan for medical management.  - Continue cilostazol, unable to come off due to worsening claudication when he stops it.  5. Hyperlipidemia: Good lipids in 3/18.  6. CKD III: BMET today. 7. Atrial fibrillation/flutter:  Paroxysmal, NSR today.  Today's TSH came back markedly high suggesting hypothyroidism.  - Cut back on amiodarone to 100 mg daily. Will need to recheck TSH and free T3/T4 to confirm.  Will need hypothyroidism treatment, will send labs to PCP.  - Check LFTs today.  Will need to get regular eye exams with amiodarone use. - Continue Eliquis 2.5 bid.  8. Suspected OSA: He cancelled his sleep study due to mask discomfort 9. COPD: History of smoking. PFTs suggestive of emphysema.  10. CVA: Cerebellar, 10/16.  He has had some resulting imbalance.  He is no longer  driving.  11. Hypertension: BP controlled.  Loralie Champagne 09/26/2017

## 2017-09-27 LAB — T3, FREE: T3 FREE: 1.9 pg/mL — AB (ref 2.0–4.4)

## 2017-10-02 ENCOUNTER — Other Ambulatory Visit (HOSPITAL_COMMUNITY): Payer: Self-pay | Admitting: Cardiology

## 2017-10-11 ENCOUNTER — Other Ambulatory Visit: Payer: Self-pay | Admitting: Internal Medicine

## 2017-10-18 ENCOUNTER — Ambulatory Visit (HOSPITAL_COMMUNITY)
Admission: RE | Admit: 2017-10-18 | Discharge: 2017-10-18 | Disposition: A | Discharge status: Home / Self Care | Payer: Medicare Other | Source: Ambulatory Visit | Attending: Cardiology | Admitting: Cardiology

## 2017-10-18 DIAGNOSIS — I6523 Occlusion and stenosis of bilateral carotid arteries: Secondary | ICD-10-CM

## 2017-10-19 ENCOUNTER — Other Ambulatory Visit: Payer: Self-pay | Admitting: *Deleted

## 2017-10-19 DIAGNOSIS — I714 Abdominal aortic aneurysm, without rupture, unspecified: Secondary | ICD-10-CM

## 2017-10-27 ENCOUNTER — Other Ambulatory Visit (HOSPITAL_COMMUNITY): Payer: Self-pay | Admitting: Cardiology

## 2017-10-27 ENCOUNTER — Other Ambulatory Visit: Payer: Self-pay | Admitting: Internal Medicine

## 2017-10-31 ENCOUNTER — Encounter: Payer: Self-pay | Admitting: Podiatry

## 2017-10-31 ENCOUNTER — Ambulatory Visit: Payer: Medicare Other | Admitting: Podiatry

## 2017-10-31 DIAGNOSIS — E1159 Type 2 diabetes mellitus with other circulatory complications: Secondary | ICD-10-CM

## 2017-10-31 DIAGNOSIS — M79676 Pain in unspecified toe(s): Secondary | ICD-10-CM

## 2017-10-31 DIAGNOSIS — I739 Peripheral vascular disease, unspecified: Secondary | ICD-10-CM

## 2017-10-31 DIAGNOSIS — B351 Tinea unguium: Secondary | ICD-10-CM | POA: Diagnosis not present

## 2017-10-31 NOTE — Progress Notes (Signed)
Patient ID: Andrew [name], male   DOB: 08-05-1928, 83 y.o.   MRN: [id number] Complaint:  Visit Type: Patient returns to my office for continued preventative foot care services. Complaint: Patient states" my nails have grown long and thick and become painful to walk and wear shoes" Patient has been diagnosed with DM with no foot complications. The patient presents for preventative foot care services. No changes to ROS  Podiatric Exam: Vascular: dorsalis pedis and posterior tibial pulses are palpable bilateral. Capillary return is immediate. Temperature gradient is WNL. Skin turgor WNL  Sensorium: Normal Semmes Weinstein monofilament test. Normal tactile sensation bilaterally. Nail Exam: Pt has thick disfigured discolored nails with subungual debris noted bilateral entire nail hallux through fifth toenails Ulcer Exam: There is no evidence of ulcer or pre-ulcerative changes or infection. Orthopedic Exam: Muscle tone and strength are WNL. No limitations in general ROM. No crepitus or effusions noted. Foot type and digits show no abnormalities. Bony prominences are unremarkable. 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 Skin: No Porokeratosis. No infection or ulcers  Diagnosis:  Onychomycosis, , Pain in right toe, pain in left toes  Treatment & Plan Procedures and Treatment: Consent by patient was obtained for treatment procedures. The patient understood the discussion of treatment and procedures well. All questions were answered thoroughly reviewed. Debridement of mycotic and hypertrophic toenails, 1 through 5 bilateral and clearing of subungual debris. No ulceration, no infection noted.  Return Visit-Office Procedure: Patient instructed to return to the office for a follow up visit 10 weeks. for continued evaluation and treatment.   Gardiner Barefoot DPM

## 2017-11-12 ENCOUNTER — Other Ambulatory Visit: Payer: Self-pay | Admitting: Internal Medicine

## 2017-11-13 ENCOUNTER — Other Ambulatory Visit: Payer: Self-pay | Admitting: Internal Medicine

## 2017-11-15 ENCOUNTER — Ambulatory Visit (HOSPITAL_COMMUNITY)
Admission: RE | Admit: 2017-11-15 | Discharge: 2017-11-15 | Disposition: A | Discharge status: Home / Self Care | Payer: Medicare Other | Source: Ambulatory Visit | Attending: Cardiology | Admitting: Cardiology

## 2017-11-15 ENCOUNTER — Other Ambulatory Visit: Payer: Self-pay | Admitting: Internal Medicine

## 2017-11-15 DIAGNOSIS — I714 Abdominal aortic aneurysm, without rupture, unspecified: Secondary | ICD-10-CM

## 2017-11-17 NOTE — Telephone Encounter (Signed)
rq to refill fluoxetine. erx sent.

## 2017-11-20 ENCOUNTER — Ambulatory Visit: Payer: Self-pay | Admitting: Surgery

## 2017-11-20 NOTE — H&P (View-Only) (Signed)
History of Present Illness  The patient is a 83 year old male who presents with an inguinal hernia.  Referred by Dr. Clearance Coots for evaluation of inguinal hernias Cardiology - Dr. [name]  This is an 83 year old male with multiple medical and cardiac comorbidities who presented with vague RLQ/ right groin pain about 1 month ago. He does not remember any single initiating event. He was evaluated in the ED and a CT stone study was peformed on 08/27/17. This showed no kidney stones, normal bowel and appendix, bilateral renal cysts, and bilateral inguinal hernias containing only fat. The patient describes occasional "soreness" across his lower abdomen/ bilateral groins. Right side more uncomfortable than left. He has been managing this with just Tylenol. The patient requires the use of a cane for ambulation and doesn't spend much time on his feet. He denies any change in his bowel movements.  He has a previous midline incision from AAA repair in 2002.    Problem List/Past Medical  BILATERAL INGUINAL HERNIA WITHOUT OBSTRUCTION OR GANGRENE (O27.74)  UMBILICAL HERNIA WITHOUT OBSTRUCTION OR GANGRENE (K42.9)   Allergies  MetFORMIN HCl *ANTIDIABETICS*  Metoprolol Succinate *BETA BLOCKERS*   Medication History  Acetaminophen (500MG  Tablet, Oral) Active. ALPRAZolam (0.5MG  Tablet, Oral) Active. Amiodarone HCl (200MG  Tablet, Oral) Active. Eliquis (2.5MG  Tablet, Oral) Active. Atorvastatin Calcium (80MG  Tablet, Oral) Active. Carvedilol (12.5MG  Tablet, Oral) Active. Cilostazol (100MG  Tablet, Oral) Active. Ezetimibe (10MG  Tablet, Oral) Active. FLUoxetine HCl (20MG  Capsule, Oral) Active. Furosemide (80MG  Tablet, Oral) Active. Losartan Potassium (100MG  Tablet, Oral) Active. Nitroglycerin (0.4MG  Tab Sublingual, Sublingual) Active. Tamsulosin HCl (0.4MG  Capsule, Oral) Active. Temazepam (30MG  Capsule, Oral) Active. TraMADol HCl (50MG  Tablet, Oral) Active. NIFEdipine ER  (30MG  Tablet ER 24HR, Oral) Active. Medications Reconciled  Other Problems  Congestive Heart Failure  Diabetes Mellitus     Review of Systems  HEENT Present- Hearing Loss and Wears glasses/contact lenses. Not Present- Earache, Hoarseness, Nose Bleed, Oral Ulcers, Ringing in the Ears, Seasonal Allergies, Sinus Pain, Sore Throat, Visual Disturbances and Yellow Eyes. Respiratory Not Present- Bloody sputum, Chronic Cough, Difficulty Breathing, Snoring and Wheezing. Cardiovascular Present- Leg Cramps. Not Present- Chest Pain, Difficulty Breathing Lying Down, Palpitations, Rapid Heart Rate, Shortness of Breath and Swelling of Extremities. Gastrointestinal Present- Abdominal Pain. Not Present- Bloating, Bloody Stool, Change in Bowel Habits, Chronic diarrhea, Constipation, Difficulty Swallowing, Excessive gas, Gets full quickly at meals, Hemorrhoids, Indigestion, Nausea, Rectal Pain and Vomiting. Musculoskeletal Present- Back Pain. Not Present- Joint Pain, Joint Stiffness, Muscle Pain, Muscle Weakness and Swelling of Extremities. Neurological Not Present- Decreased Memory, Fainting, Headaches, Numbness, Seizures, Tingling, Tremor, Trouble walking and Weakness. Psychiatric Not Present- Anxiety, Bipolar, Change in Sleep Pattern, Depression, Fearful and Frequent crying. Hematology Present- Blood Thinners. Not Present- Easy Bruising, Excessive bleeding, Gland problems, HIV and Persistent Infections.  Vitals Weight: 194.6 lb Height: 67in Body Surface Area: 2 m Body Mass Index: 30.48 kg/m  Temp.: 97.38F(Oral)  Pulse: 74 (Regular)  BP: 102/62 (Sitting, Left Arm, Standard)       Physical Exam The physical exam findings are as follows: Note:WDWN in NAD - frail Eyes: Pupils equal, round; sclera anicteric Very hard of hearing HENT: Oral mucosa moist; good dentition Neck: No masses palpated, no thyromegaly Lungs: CTA bilaterally; normal respiratory effort CV: Regular rate and  rhythm; no murmurs; extremities well-perfused with no edema Abd: +bowel sounds, soft, non-tender, no palpable organomegaly; long midline incision - well-healed; protruding umbilical hernia - easily reduced GU: significant groin/ suprapubic adipose tissue. I am not able to palpate  an inguinal hernia on either side. He is mildly tender with direct pressure over the internal inguinal rings bilaterally Skin: Warm, dry; no sign of jaundice Psychiatric - alert and oriented x 4; calm mood and affect    Assessment & Plan  BILATERAL INGUINAL HERNIA WITHOUT OBSTRUCTION OR GANGRENE (I71.95) UMBILICAL HERNIA WITHOUT OBSTRUCTION OR GANGRENE (K42.9) Current Plans   The patient is minimally symptomatic at this time. There does not seem to be any involvement of the small intestine or colon. His discomfort has worsened and he would like to proceed with surgery.  We have obtained cardiac clearance.    Imogene Burn. Georgette Dover, MD, [facility]  General/ Trauma Surgery  11/20/2017 11:32 AM

## 2017-11-20 NOTE — H&P (Signed)
History of Present Illness  The patient is a 83 year old male who presents with an inguinal hernia.  Referred by Dr. Clearance Coots for evaluation of inguinal hernias Cardiology - Dr. [name]  This is an 83 year old male with multiple medical and cardiac comorbidities who presented with vague RLQ/ right groin pain about 1 month ago. He does not remember any single initiating event. He was evaluated in the ED and a CT stone study was peformed on 08/27/17. This showed no kidney stones, normal bowel and appendix, bilateral renal cysts, and bilateral inguinal hernias containing only fat. The patient describes occasional "soreness" across his lower abdomen/ bilateral groins. Right side more uncomfortable than left. He has been managing this with just Tylenol. The patient requires the use of a cane for ambulation and doesn't spend much time on his feet. He denies any change in his bowel movements.  He has a previous midline incision from AAA repair in 2002.    Problem List/Past Medical  BILATERAL INGUINAL HERNIA WITHOUT OBSTRUCTION OR GANGRENE (S06.30)  UMBILICAL HERNIA WITHOUT OBSTRUCTION OR GANGRENE (K42.9)   Allergies  MetFORMIN HCl *ANTIDIABETICS*  Metoprolol Succinate *BETA BLOCKERS*   Medication History  Acetaminophen (500MG  Tablet, Oral) Active. ALPRAZolam (0.5MG  Tablet, Oral) Active. Amiodarone HCl (200MG  Tablet, Oral) Active. Eliquis (2.5MG  Tablet, Oral) Active. Atorvastatin Calcium (80MG  Tablet, Oral) Active. Carvedilol (12.5MG  Tablet, Oral) Active. Cilostazol (100MG  Tablet, Oral) Active. Ezetimibe (10MG  Tablet, Oral) Active. FLUoxetine HCl (20MG  Capsule, Oral) Active. Furosemide (80MG  Tablet, Oral) Active. Losartan Potassium (100MG  Tablet, Oral) Active. Nitroglycerin (0.4MG  Tab Sublingual, Sublingual) Active. Tamsulosin HCl (0.4MG  Capsule, Oral) Active. Temazepam (30MG  Capsule, Oral) Active. TraMADol HCl (50MG  Tablet, Oral) Active. NIFEdipine ER  (30MG  Tablet ER 24HR, Oral) Active. Medications Reconciled  Other Problems  Congestive Heart Failure  Diabetes Mellitus     Review of Systems  HEENT Present- Hearing Loss and Wears glasses/contact lenses. Not Present- Earache, Hoarseness, Nose Bleed, Oral Ulcers, Ringing in the Ears, Seasonal Allergies, Sinus Pain, Sore Throat, Visual Disturbances and Yellow Eyes. Respiratory Not Present- Bloody sputum, Chronic Cough, Difficulty Breathing, Snoring and Wheezing. Cardiovascular Present- Leg Cramps. Not Present- Chest Pain, Difficulty Breathing Lying Down, Palpitations, Rapid Heart Rate, Shortness of Breath and Swelling of Extremities. Gastrointestinal Present- Abdominal Pain. Not Present- Bloating, Bloody Stool, Change in Bowel Habits, Chronic diarrhea, Constipation, Difficulty Swallowing, Excessive gas, Gets full quickly at meals, Hemorrhoids, Indigestion, Nausea, Rectal Pain and Vomiting. Musculoskeletal Present- Back Pain. Not Present- Joint Pain, Joint Stiffness, Muscle Pain, Muscle Weakness and Swelling of Extremities. Neurological Not Present- Decreased Memory, Fainting, Headaches, Numbness, Seizures, Tingling, Tremor, Trouble walking and Weakness. Psychiatric Not Present- Anxiety, Bipolar, Change in Sleep Pattern, Depression, Fearful and Frequent crying. Hematology Present- Blood Thinners. Not Present- Easy Bruising, Excessive bleeding, Gland problems, HIV and Persistent Infections.  Vitals Weight: 194.6 lb Height: 67in Body Surface Area: 2 m Body Mass Index: 30.48 kg/m  Temp.: 97.49F(Oral)  Pulse: 74 (Regular)  BP: 102/62 (Sitting, Left Arm, Standard)       Physical Exam The physical exam findings are as follows: Note:WDWN in NAD - frail Eyes: Pupils equal, round; sclera anicteric Very hard of hearing HENT: Oral mucosa moist; good dentition Neck: No masses palpated, no thyromegaly Lungs: CTA bilaterally; normal respiratory effort CV: Regular rate and  rhythm; no murmurs; extremities well-perfused with no edema Abd: +bowel sounds, soft, non-tender, no palpable organomegaly; long midline incision - well-healed; protruding umbilical hernia - easily reduced GU: significant groin/ suprapubic adipose tissue. I am not able to palpate  an inguinal hernia on either side. He is mildly tender with direct pressure over the internal inguinal rings bilaterally Skin: Warm, dry; no sign of jaundice Psychiatric - alert and oriented x 4; calm mood and affect    Assessment & Plan  BILATERAL INGUINAL HERNIA WITHOUT OBSTRUCTION OR GANGRENE (M76.80) UMBILICAL HERNIA WITHOUT OBSTRUCTION OR GANGRENE (K42.9) Current Plans   The patient is minimally symptomatic at this time. There does not seem to be any involvement of the small intestine or colon. His discomfort has worsened and he would like to proceed with surgery.  We have obtained cardiac clearance.    Imogene Burn. Georgette Dover, MD, [facility] Surgery  General/ Trauma Surgery  11/20/2017 11:32 AM

## 2017-11-27 ENCOUNTER — Encounter (HOSPITAL_COMMUNITY)
Admission: RE | Admit: 2017-11-27 | Discharge: 2017-11-27 | Disposition: A | Discharge status: Home / Self Care | Payer: Medicare Other | Source: Ambulatory Visit | Attending: Surgery | Admitting: Surgery

## 2017-11-27 ENCOUNTER — Encounter (HOSPITAL_COMMUNITY): Payer: Self-pay | Admitting: Anesthesiology

## 2017-11-27 ENCOUNTER — Other Ambulatory Visit: Payer: Self-pay

## 2017-11-27 ENCOUNTER — Encounter (HOSPITAL_COMMUNITY): Payer: Self-pay

## 2017-11-27 ENCOUNTER — Other Ambulatory Visit (HOSPITAL_COMMUNITY): Payer: Self-pay | Admitting: *Deleted

## 2017-11-27 DIAGNOSIS — Z7984 Long term (current) use of oral hypoglycemic drugs: Secondary | ICD-10-CM | POA: Diagnosis not present

## 2017-11-27 DIAGNOSIS — I2510 Atherosclerotic heart disease of native coronary artery without angina pectoris: Secondary | ICD-10-CM | POA: Diagnosis not present

## 2017-11-27 DIAGNOSIS — I110 Hypertensive heart disease with heart failure: Secondary | ICD-10-CM | POA: Diagnosis not present

## 2017-11-27 DIAGNOSIS — Z7902 Long term (current) use of antithrombotics/antiplatelets: Secondary | ICD-10-CM | POA: Diagnosis not present

## 2017-11-27 DIAGNOSIS — R296 Repeated falls: Secondary | ICD-10-CM | POA: Diagnosis not present

## 2017-11-27 DIAGNOSIS — Z87891 Personal history of nicotine dependence: Secondary | ICD-10-CM | POA: Diagnosis not present

## 2017-11-27 DIAGNOSIS — I714 Abdominal aortic aneurysm, without rupture: Secondary | ICD-10-CM | POA: Diagnosis not present

## 2017-11-27 DIAGNOSIS — K4020 Bilateral inguinal hernia, without obstruction or gangrene, not specified as recurrent: Secondary | ICD-10-CM | POA: Diagnosis not present

## 2017-11-27 DIAGNOSIS — I509 Heart failure, unspecified: Secondary | ICD-10-CM | POA: Diagnosis not present

## 2017-11-27 DIAGNOSIS — E039 Hypothyroidism, unspecified: Secondary | ICD-10-CM | POA: Diagnosis not present

## 2017-11-27 DIAGNOSIS — E1151 Type 2 diabetes mellitus with diabetic peripheral angiopathy without gangrene: Secondary | ICD-10-CM | POA: Diagnosis not present

## 2017-11-27 DIAGNOSIS — E785 Hyperlipidemia, unspecified: Secondary | ICD-10-CM | POA: Diagnosis not present

## 2017-11-27 DIAGNOSIS — I480 Paroxysmal atrial fibrillation: Secondary | ICD-10-CM | POA: Diagnosis not present

## 2017-11-27 DIAGNOSIS — R2681 Unsteadiness on feet: Secondary | ICD-10-CM | POA: Diagnosis not present

## 2017-11-27 DIAGNOSIS — K429 Umbilical hernia without obstruction or gangrene: Secondary | ICD-10-CM | POA: Diagnosis not present

## 2017-11-27 DIAGNOSIS — K219 Gastro-esophageal reflux disease without esophagitis: Secondary | ICD-10-CM | POA: Diagnosis not present

## 2017-11-27 DIAGNOSIS — Z79899 Other long term (current) drug therapy: Secondary | ICD-10-CM | POA: Diagnosis not present

## 2017-11-27 DIAGNOSIS — F329 Major depressive disorder, single episode, unspecified: Secondary | ICD-10-CM | POA: Diagnosis not present

## 2017-11-27 HISTORY — DX: Cardiac arrhythmia, unspecified: I49.9

## 2017-11-27 LAB — BASIC METABOLIC PANEL
Anion gap: 8 (ref 5–15)
BUN: 39 mg/dL — AB (ref 6–20)
CHLORIDE: 104 mmol/L (ref 101–111)
CO2: 31 mmol/L (ref 22–32)
CREATININE: 1.89 mg/dL — AB (ref 0.61–1.24)
Calcium: 9.4 mg/dL (ref 8.9–10.3)
GFR calc Af Amer: 35 mL/min — ABNORMAL LOW (ref >60)
GFR calc non Af Amer: 30 mL/min — ABNORMAL LOW (ref >60)
GLUCOSE: 108 mg/dL — AB (ref 65–99)
Potassium: 3.8 mmol/L (ref 3.5–5.1)
SODIUM: 143 mmol/L (ref 135–145)

## 2017-11-27 LAB — HEMOGLOBIN A1C
Hgb A1c MFr Bld: 6.6 % — ABNORMAL HIGH (ref 4.8–5.6)
Mean Plasma Glucose: 142.72 mg/dL

## 2017-11-27 LAB — GLUCOSE, CAPILLARY: GLUCOSE-CAPILLARY: 121 mg/dL — AB (ref 65–99)

## 2017-11-27 LAB — CBC
HEMATOCRIT: 37.4 % — AB (ref 39.0–52.0)
Hemoglobin: 12.1 g/dL — ABNORMAL LOW (ref 13.0–17.0)
MCH: 33.2 pg (ref 26.0–34.0)
MCHC: 32.4 g/dL (ref 30.0–36.0)
MCV: 102.5 fL — AB (ref 78.0–100.0)
PLATELETS: 138 10*3/uL — AB (ref 150–400)
RBC: 3.65 MIL/uL — ABNORMAL LOW (ref 4.22–5.81)
RDW: 13.5 % (ref 11.5–15.5)
WBC: 6.4 10*3/uL (ref 4.0–10.5)

## 2017-11-27 NOTE — Progress Notes (Signed)
Cardilogist Dr. Loralie Champagne   Cardiac Clearance in chart.

## 2017-11-27 NOTE — Pre-Procedure Instructions (Addendum)
[place]  11/27/2017      [place], [place] [address] Phone: [phone] Fax: [phone]    Your procedure is scheduled on 11-28-2017 Tuesday .  Report to [facility] Admitting at 5:30 A.M.   Call this number if you have problems the morning of surgery:  [phone]   Remember:  Do not eat food or drink liquids after midnight.   Take these medicines the morning of surgery with A SIP OF WATER Tylenol if needed,alprazolam(Xanax) if needed,amiodarone(Pacerone) carvedilol(Coreg),Fluoxetine(Prozac),Nifedipine(Procardia XL).tramadol(Ultram) if needed   Drink bottle of Ensure Pre-Surgical before leaving for the hospital   STOP ELIQUIS 3 DAYS PRIOR TO SURGERY STOP ASPIRIN 1 DAY PROIR TO SURGERI STOP PLENTAL  1 DAY PRIOR TO SURGERY     Do not wear jewelry,   Do not wear lotions, powders, or perfumes, or deoderant.  Do not shave 48 hours prior to surgery.  Men may shave face and neck.  Do not bring valuables to the hospital.   [facility] is not responsible for any belongings or valuables.  Contacts, dentures or bridgework may not be worn into surgery.  Leave your suitcase in the car.  After surgery it may be brought to your room.  For patients admitted to the hospital, discharge time will be determined by your treatment team.  Patients discharged the day of surgery will not be allowed to drive home.    Special Instructions: [place] - Preparing for Surgery  Before surgery, you can play an important role.  Because skin is not sterile, your skin needs to be as free of germs as possible.  You can reduce the number of germs on you skin by washing with CHG (chlorahexidine gluconate) soap before surgery.  CHG is an antiseptic cleaner which kills germs and bonds with the skin to continue killing germs even after washing.  Please DO NOT use if you have an allergy to  CHG or antibacterial soaps.  If your skin becomes reddened/irritated stop using the CHG and inform your nurse when you arrive at Short Stay.  Do not shave (including legs and underarms) for at least 48 hours prior to the first CHG shower.  You may shave your face.  Please follow these instructions carefully:   1.  Shower with CHG Soap the night before surgery and the   morning of Surgery.  2.  If you choose to wash your hair, wash your hair first as usual with your normal shampoo.  3.  After you shampoo, rinse your hair and body thoroughly to remove the  Shampoo.  4.  Use CHG as you would any other liquid soap.  You can apply chg directly  to the skin and wash gently with scrungie or a clean washcloth.  5.  Apply the CHG Soap to your body ONLY FROM THE NECK DOWN.   Do not use on open wounds or open sores.  Avoid contact with your eyes,  ears, mouth and genitals (private parts).  Wash genitals (private parts) with your normal soap.  6.  Wash thoroughly, paying special attention to the area where your surgery will be performed.  7.  Thoroughly rinse your body with warm water from the neck down.  8.  DO NOT shower/wash with your normal soap after using and rinsing o  the CHG Soap.  9.  Pat yourself dry with a clean towel.  10.  Wear clean pajamas.            11.  Place clean sheets on your bed the night of your first shower and do not sleep with pets.  Day of Surgery  Do not apply any lotions/deodorants the morning of surgery.  Please wear clean clothes to the hospital/surgery center.   Please read over the following fact sheets that you were given. Coughing and Deep Breathing and Surgical Site Infection Prevention

## 2017-11-27 NOTE — Progress Notes (Signed)
Anesthesia Chart Review:  Pt is an 83 year old male scheduled for laparoscopic bilateral inguinal hernia repair with mesh, umbilical hernia repair with mesh on 11/28/2017 with Donnie Mesa, MD  - PCP is Clearance Coots, MD - Cardiologist is Loralie Champagne, MD who cleared pt for surgery. Last office visit 09/25/17.    PMH includes:  CAD, CHF, PAF, AAA (s/p repair 2002), HTN, DM, hyperlipidemia, carotid artery stenosis, fatty liver, anemia, thyroid disease, GERD. Former smoker.  Medications include: Amiodarone, Lipitor, carvedilol, Pletal, Eliquis, Zetia, Lasix, losartan, nifedipine. Pt to stop eliquis 3 days before surgery, to stop pletal 1 day before surgery.   Preoperative labs reviewed.   - Hba1c 6.6, glucose 108 - Cr 1.89, BUN 39.  This is consistent with prior results.   EKG 09/25/17: NSR. First degree heart block. Left axis deviation. Non-specific intra-ventricular conduction block. Cannot rule out Anteroseptal infarct, age undetermined.   AAA duplex 11/15/17:  - There is evidence of abnormal dilitation of the Mid Abdominal aorta. The largest aortic measurement is 3.2 cm. The largest aortic diameter remains essentially unchanged compared to prior exam. Previous diameter measurement was 3.5 cm   Carotid duplex 10/18/17:  - Right Carotid: There is evidence in the right ICA of a 1-39% stenosis. Non-hemodynamically significant plaque <50% noted in the CCA. - Left Carotid: There is evidence in the left ICA of a high-end 1-39% stenosis.Non-hemodynamically significant plaque noted in the CCA. Technically challenging study to to neck girth. - Vertebrals: Both vertebral arteries were patent with antegrade flow. - Subclavians: Normal flow hemodynamics were seen in bilateral subclavian arteries.  Nuclear stress test 03/13/17:   Nuclear stress EF: 51%.  There was no ST segment deviation noted during stress.  No T wave inversion was noted during stress.  Defect 1: There is a small defect of  severe severity present in the basal anterolateral and mid anterolateral location.  Findings consistent with prior myocardial infarction.  The left ventricular ejection fraction is mildly decreased (45-54%).  This is a low risk study.  Echo 03/13/17:  - Left ventricle: The cavity size was normal. There was mild concentric hypertrophy. Systolic function was mildly to moderately reduced. The estimated ejection fraction was in the range of 40% to 45%. Hypokinesis of the mid-apicalanterior myocardium. Anterior septum motion is preserved. Doppler parameters are consistent with abnormal left ventricular relaxation (grade 1 diastolic dysfunction). Doppler parameters are consistent with elevated mean left atrial filling pressure. - Ventricular septum: Septal motion showed paradox. These changes are consistent with a post-thoracotomy state. - Mitral valve: Moderately calcified annulus. Mildly thickened, mildly calcified leaflets . - Left atrium: The atrium was mildly dilated.  Cardiac cath 10/30/14:  - LM: Arises from the right cusp in very close proximity to the ostium of the RCA.  The left main is a long, tortuous vessel.  It is heavily calcified at the ostium with probably around 40-50% ostial stenosis.  There is a small atrial branch that arises from the right cusp in close proximity to the left main ostium.  This branch has 50-60% ostial/proximal stenosis. - LCx: This vessel is relatively small and gives rise to several small obtuse marginal vessels.  There are mild to moderate diffuse luminal irregularities.   - LAD: The LAD is occluded proximally.  The mid to distal LAD fills by collaterals from the PDA.  The LAD is a relatively small vessel also given the large RCA.  - RCA: The RCA was a large, super-dominant vessel.  The proximal to mid  RCA is severely ectatic.  Mid RCA stent patent with about 50% in-stent restenosis and good flow down the vessel.  Diffuse mid to moderate disease throughout the large  RCA in the 30-40% stenosis range.  50% ostial PDA. PDA wraps around the apex.  SVG-PLV is patent, supplying a relatively small PLV branch.  30% stenosis at the ostium of the graft.  The right internal mammary artery graft to the PDA is known to be occluded and was not injected.  The PDA provides collaterals to the totally occluded LAD.   If no changes, I anticipate pt can proceed with surgery as scheduled.   Willeen Cass, FNP-BC [facility]/Anesthesiology Phone: [phone] 11/27/2017 3:36 PM

## 2017-11-27 NOTE — Anesthesia Preprocedure Evaluation (Addendum)
Anesthesia Evaluation  Patient identified by MRN, date of birth, ID band Patient awake    Reviewed: Allergy & Precautions, NPO status , Patient's Chart, lab work & pertinent test results  Airway Mallampati: III  TM Distance: >3 FB Neck ROM: Full    Dental  (+) Teeth Intact, Dental Advisory Given   Pulmonary former smoker,    breath sounds clear to auscultation       Cardiovascular hypertension, Pt. on home beta blockers and Pt. on medications + CAD, + CABG, + Peripheral Vascular Disease and +CHF  + dysrhythmias Atrial Fibrillation  Rhythm:Regular Rate:Normal     Neuro/Psych PSYCHIATRIC DISORDERS Depression  Neuromuscular disease CVA    GI/Hepatic GERD  ,  Endo/Other  diabetesHypothyroidism   Renal/GU Renal disease     Musculoskeletal  (+) Arthritis ,   Abdominal (+) + obese,   Peds  Hematology   Anesthesia Other Findings   Reproductive/Obstetrics                            Lab Results  Component Value Date   WBC 6.4 11/27/2017   HGB 12.1 (L) 11/27/2017   HCT 37.4 (L) 11/27/2017   MCV 102.5 (H) 11/27/2017   PLT 138 (L) 11/27/2017   Lab Results  Component Value Date   CREATININE 1.89 (H) 11/27/2017   BUN 39 (H) 11/27/2017   NA 143 11/27/2017   K 3.8 11/27/2017   CL 104 11/27/2017   CO2 31 11/27/2017   Lab Results  Component Value Date   INR 1.18 09/17/2015   INR 1.27 08/19/2015   INR 1.19 08/13/2015   ECho: Left ventricle: The cavity size was normal. There was mild   concentric hypertrophy. Systolic function was mildly to   moderately reduced. The estimated ejection fraction was in the   range of 40% to 45%. Hypokinesis of the mid-apicalanterior   myocardium. Anterior septum motion is preserved. Doppler   parameters are consistent with abnormal left ventricular   relaxation (grade 1 diastolic dysfunction). Doppler parameters   are consistent with elevated mean left atrial  filling pressure. - Ventricular septum: Septal motion showed paradox. These changes   are consistent with a post-thoracotomy state. - Mitral valve: Moderately calcified annulus. Mildly thickened,   mildly calcified leaflets . - Left atrium: The atrium was mildly dilated.  EKG: NSR, 1st degree AV Block  Anesthesia Physical Anesthesia Plan  ASA: III  Anesthesia Plan: General   Post-op Pain Management:  Regional for Post-op pain   Induction: Intravenous  PONV Risk Score and Plan: 3 and Ondansetron, Dexamethasone and Treatment may vary due to age or medical condition  Airway Management Planned: Oral ETT  Additional Equipment:   Intra-op Plan:   Post-operative Plan: Extubation in OR  Informed Consent: I have reviewed the patients History and Physical, chart, labs and discussed the procedure including the risks, benefits and alternatives for the proposed anesthesia with the patient or authorized representative who has indicated his/her understanding and acceptance.   Dental advisory given  Plan Discussed with: CRNA  Anesthesia Plan Comments:         Anesthesia Quick Evaluation

## 2017-11-28 ENCOUNTER — Encounter (HOSPITAL_COMMUNITY): Payer: Self-pay | Admitting: *Deleted

## 2017-11-28 ENCOUNTER — Ambulatory Visit (HOSPITAL_COMMUNITY)
Admission: RE | Admit: 2017-11-28 | Discharge: 2017-11-29 | Disposition: A | Discharge status: Skilled Nursing Facility | Payer: Medicare Other | Source: Ambulatory Visit | Attending: Surgery | Admitting: Surgery

## 2017-11-28 ENCOUNTER — Encounter (HOSPITAL_COMMUNITY)
Admission: RE | Disposition: A | Discharge status: Skilled Nursing Facility | Payer: Self-pay | Source: Ambulatory Visit | Attending: Surgery

## 2017-11-28 ENCOUNTER — Ambulatory Visit (HOSPITAL_COMMUNITY): Payer: Medicare Other | Admitting: Anesthesiology

## 2017-11-28 ENCOUNTER — Other Ambulatory Visit: Payer: Self-pay

## 2017-11-28 ENCOUNTER — Ambulatory Visit (HOSPITAL_COMMUNITY): Payer: Medicare Other | Admitting: Emergency Medicine

## 2017-11-28 DIAGNOSIS — I509 Heart failure, unspecified: Secondary | ICD-10-CM | POA: Diagnosis not present

## 2017-11-28 DIAGNOSIS — F329 Major depressive disorder, single episode, unspecified: Secondary | ICD-10-CM | POA: Insufficient documentation

## 2017-11-28 DIAGNOSIS — I2510 Atherosclerotic heart disease of native coronary artery without angina pectoris: Secondary | ICD-10-CM | POA: Insufficient documentation

## 2017-11-28 DIAGNOSIS — K4021 Bilateral inguinal hernia, without obstruction or gangrene, recurrent: Secondary | ICD-10-CM | POA: Diagnosis present

## 2017-11-28 DIAGNOSIS — I480 Paroxysmal atrial fibrillation: Secondary | ICD-10-CM | POA: Insufficient documentation

## 2017-11-28 DIAGNOSIS — K4020 Bilateral inguinal hernia, without obstruction or gangrene, not specified as recurrent: Secondary | ICD-10-CM | POA: Diagnosis not present

## 2017-11-28 DIAGNOSIS — R2681 Unsteadiness on feet: Secondary | ICD-10-CM | POA: Insufficient documentation

## 2017-11-28 DIAGNOSIS — K429 Umbilical hernia without obstruction or gangrene: Secondary | ICD-10-CM | POA: Diagnosis not present

## 2017-11-28 DIAGNOSIS — I110 Hypertensive heart disease with heart failure: Secondary | ICD-10-CM | POA: Insufficient documentation

## 2017-11-28 DIAGNOSIS — Z87891 Personal history of nicotine dependence: Secondary | ICD-10-CM | POA: Insufficient documentation

## 2017-11-28 DIAGNOSIS — K219 Gastro-esophageal reflux disease without esophagitis: Secondary | ICD-10-CM | POA: Insufficient documentation

## 2017-11-28 DIAGNOSIS — E039 Hypothyroidism, unspecified: Secondary | ICD-10-CM | POA: Insufficient documentation

## 2017-11-28 DIAGNOSIS — Z79899 Other long term (current) drug therapy: Secondary | ICD-10-CM | POA: Insufficient documentation

## 2017-11-28 DIAGNOSIS — Z7902 Long term (current) use of antithrombotics/antiplatelets: Secondary | ICD-10-CM | POA: Insufficient documentation

## 2017-11-28 DIAGNOSIS — Z7984 Long term (current) use of oral hypoglycemic drugs: Secondary | ICD-10-CM | POA: Insufficient documentation

## 2017-11-28 DIAGNOSIS — I714 Abdominal aortic aneurysm, without rupture: Secondary | ICD-10-CM | POA: Insufficient documentation

## 2017-11-28 DIAGNOSIS — E1151 Type 2 diabetes mellitus with diabetic peripheral angiopathy without gangrene: Secondary | ICD-10-CM | POA: Insufficient documentation

## 2017-11-28 DIAGNOSIS — R296 Repeated falls: Secondary | ICD-10-CM | POA: Insufficient documentation

## 2017-11-28 DIAGNOSIS — E785 Hyperlipidemia, unspecified: Secondary | ICD-10-CM | POA: Insufficient documentation

## 2017-11-28 HISTORY — PX: INSERTION OF MESH: SHX5868

## 2017-11-28 HISTORY — DX: Urinary tract infection, site not specified: N39.0

## 2017-11-28 HISTORY — PX: UMBILICAL HERNIA REPAIR: SHX196

## 2017-11-28 HISTORY — PX: LAPAROSCOPIC INGUINAL HERNIA WITH UMBILICAL HERNIA: SHX5658

## 2017-11-28 LAB — GLUCOSE, CAPILLARY: Glucose-Capillary: 247 mg/dL — ABNORMAL HIGH (ref 65–99)

## 2017-11-28 SURGERY — LAPAROSCOPIC INGUINAL HERNIA WITH UMBILICAL HERNIA
Anesthesia: General | Site: Abdomen

## 2017-11-28 MED ORDER — FENTANYL CITRATE (PF) 100 MCG/2ML IJ SOLN
INTRAMUSCULAR | Status: DC | PRN
  Administered 2017-11-28 (×2): 50 ug via INTRAVENOUS

## 2017-11-28 MED ORDER — BUPIVACAINE-EPINEPHRINE (PF) 0.25% -1:200000 IJ SOLN
INTRAMUSCULAR | Status: DC | PRN
  Administered 2017-11-28 (×2): 30 mL

## 2017-11-28 MED ORDER — GABAPENTIN 300 MG PO CAPS
ORAL_CAPSULE | ORAL | Status: AC
  Administered 2017-11-28: 300 mg via ORAL
  Filled 2017-11-28: qty 1

## 2017-11-28 MED ORDER — ACETAMINOPHEN 500 MG PO TABS
1000.0000 mg | ORAL_TABLET | ORAL | Status: AC
  Administered 2017-11-28: 1000 mg via ORAL

## 2017-11-28 MED ORDER — ROCURONIUM BROMIDE 10 MG/ML (PF) SYRINGE
PREFILLED_SYRINGE | INTRAVENOUS | Status: AC
  Filled 2017-11-28: qty 5

## 2017-11-28 MED ORDER — HYDRALAZINE HCL 20 MG/ML IJ SOLN: 10.0000 mg | INTRAMUSCULAR | Status: DC | PRN

## 2017-11-28 MED ORDER — DIPHENHYDRAMINE HCL 12.5 MG/5ML PO ELIX: 12.5000 mg | ORAL_SOLUTION | Freq: Four times a day (QID) | ORAL | Status: DC | PRN

## 2017-11-28 MED ORDER — HYDROCODONE-ACETAMINOPHEN 5-325 MG PO TABS
1.0000 | ORAL_TABLET | ORAL | Status: DC | PRN
  Administered 2017-11-28 (×2): 2 via ORAL
  Filled 2017-11-28 (×3): qty 2

## 2017-11-28 MED ORDER — DEXAMETHASONE SODIUM PHOSPHATE 10 MG/ML IJ SOLN
INTRAMUSCULAR | Status: AC
  Filled 2017-11-28: qty 1

## 2017-11-28 MED ORDER — ACETAMINOPHEN 325 MG PO TABS: 650.0000 mg | ORAL_TABLET | Freq: Four times a day (QID) | ORAL | Status: DC | PRN

## 2017-11-28 MED ORDER — EPHEDRINE SULFATE 50 MG/ML IJ SOLN
INTRAMUSCULAR | Status: DC | PRN
  Administered 2017-11-28: 5 mg via INTRAVENOUS
  Administered 2017-11-28 (×4): 10 mg via INTRAVENOUS
  Administered 2017-11-28: 5 mg via INTRAVENOUS

## 2017-11-28 MED ORDER — BUPIVACAINE-EPINEPHRINE (PF) 0.25% -1:200000 IJ SOLN
INTRAMUSCULAR | Status: AC
  Filled 2017-11-28: qty 30

## 2017-11-28 MED ORDER — PROPOFOL 10 MG/ML IV BOLUS
INTRAVENOUS | Status: DC | PRN
  Administered 2017-11-28: 130 mg via INTRAVENOUS

## 2017-11-28 MED ORDER — LACTATED RINGERS IV SOLN
INTRAVENOUS | Status: DC | PRN
  Administered 2017-11-28 (×2): via INTRAVENOUS

## 2017-11-28 MED ORDER — ONDANSETRON HCL 4 MG/2ML IJ SOLN
INTRAMUSCULAR | Status: DC | PRN
  Administered 2017-11-28: 4 mg via INTRAVENOUS

## 2017-11-28 MED ORDER — FUROSEMIDE 80 MG PO TABS
80.0000 mg | ORAL_TABLET | Freq: Every evening | ORAL | Status: DC
  Administered 2017-11-28 – 2017-11-29 (×2): 80 mg via ORAL
  Filled 2017-11-28 (×2): qty 1

## 2017-11-28 MED ORDER — GABAPENTIN 300 MG PO CAPS
300.0000 mg | ORAL_CAPSULE | Freq: Two times a day (BID) | ORAL | Status: DC
  Administered 2017-11-28 – 2017-11-29 (×3): 300 mg via ORAL
  Filled 2017-11-28 (×3): qty 1

## 2017-11-28 MED ORDER — LIDOCAINE 2% (20 MG/ML) 5 ML SYRINGE
INTRAMUSCULAR | Status: AC
  Filled 2017-11-28: qty 5

## 2017-11-28 MED ORDER — BUPIVACAINE-EPINEPHRINE 0.25% -1:200000 IJ SOLN
INTRAMUSCULAR | Status: DC | PRN
  Administered 2017-11-28: 30 mL

## 2017-11-28 MED ORDER — LIDOCAINE HCL (CARDIAC) 20 MG/ML IV SOLN
INTRAVENOUS | Status: DC | PRN
  Administered 2017-11-28: 100 mg via INTRAVENOUS

## 2017-11-28 MED ORDER — GLYCOPYRROLATE 0.2 MG/ML IJ SOLN
INTRAMUSCULAR | Status: DC | PRN
  Administered 2017-11-28: 0.2 mg via INTRAVENOUS
  Administered 2017-11-28: 0.4 mg via INTRAVENOUS

## 2017-11-28 MED ORDER — NEOSTIGMINE METHYLSULFATE 10 MG/10ML IV SOLN
INTRAVENOUS | Status: DC | PRN
  Administered 2017-11-28: 1 mg via INTRAVENOUS

## 2017-11-28 MED ORDER — CEFAZOLIN SODIUM-DEXTROSE 2-4 GM/100ML-% IV SOLN
2.0000 g | Freq: Three times a day (TID) | INTRAVENOUS | Status: AC
  Administered 2017-11-28: 2 g via INTRAVENOUS
  Filled 2017-11-28: qty 100

## 2017-11-28 MED ORDER — CEFAZOLIN SODIUM-DEXTROSE 2-4 GM/100ML-% IV SOLN
INTRAVENOUS | Status: AC
  Filled 2017-11-28: qty 100

## 2017-11-28 MED ORDER — SENNOSIDES-DOCUSATE SODIUM 8.6-50 MG PO TABS: 1.0000 | ORAL_TABLET | Freq: Every evening | ORAL | Status: DC | PRN

## 2017-11-28 MED ORDER — CEFAZOLIN SODIUM-DEXTROSE 2-4 GM/100ML-% IV SOLN
2.0000 g | INTRAVENOUS | Status: AC
  Administered 2017-11-28: 2 g via INTRAVENOUS

## 2017-11-28 MED ORDER — ALPRAZOLAM 0.25 MG PO TABS: 0.2500 mg | ORAL_TABLET | Freq: Two times a day (BID) | ORAL | Status: DC | PRN

## 2017-11-28 MED ORDER — EPHEDRINE 5 MG/ML INJ
INTRAVENOUS | Status: AC
  Filled 2017-11-28: qty 10

## 2017-11-28 MED ORDER — DEXAMETHASONE SODIUM PHOSPHATE 10 MG/ML IJ SOLN
INTRAMUSCULAR | Status: DC | PRN
  Administered 2017-11-28: 10 mg via INTRAVENOUS

## 2017-11-28 MED ORDER — DIPHENHYDRAMINE HCL 50 MG/ML IJ SOLN: 12.5000 mg | Freq: Four times a day (QID) | INTRAMUSCULAR | Status: DC | PRN

## 2017-11-28 MED ORDER — ONDANSETRON HCL 4 MG/2ML IJ SOLN: 4.0000 mg | Freq: Four times a day (QID) | INTRAMUSCULAR | Status: DC | PRN

## 2017-11-28 MED ORDER — CARVEDILOL 12.5 MG PO TABS
12.5000 mg | ORAL_TABLET | Freq: Two times a day (BID) | ORAL | Status: DC
  Administered 2017-11-28 – 2017-11-29 (×3): 12.5 mg via ORAL
  Filled 2017-11-28 (×3): qty 1

## 2017-11-28 MED ORDER — FLUOXETINE HCL 20 MG PO CAPS
20.0000 mg | ORAL_CAPSULE | Freq: Every day | ORAL | Status: DC
Start: 2017-11-29 — End: 2017-11-29
  Administered 2017-11-29: 20 mg via ORAL
  Filled 2017-11-28: qty 1

## 2017-11-28 MED ORDER — ROCURONIUM BROMIDE 100 MG/10ML IV SOLN
INTRAVENOUS | Status: DC | PRN
  Administered 2017-11-28: 10 mg via INTRAVENOUS
  Administered 2017-11-28: 50 mg via INTRAVENOUS

## 2017-11-28 MED ORDER — STERILE WATER FOR IRRIGATION IR SOLN
Status: DC | PRN
  Administered 2017-11-28: 200 mL

## 2017-11-28 MED ORDER — 0.9 % SODIUM CHLORIDE (POUR BTL) OPTIME
TOPICAL | Status: DC | PRN
  Administered 2017-11-28: 1000 mL

## 2017-11-28 MED ORDER — MIDAZOLAM HCL 2 MG/2ML IJ SOLN
INTRAMUSCULAR | Status: AC
  Filled 2017-11-28: qty 2

## 2017-11-28 MED ORDER — CHLORHEXIDINE GLUCONATE CLOTH 2 % EX PADS: 6.0000 | MEDICATED_PAD | Freq: Once | CUTANEOUS | Status: DC

## 2017-11-28 MED ORDER — MORPHINE SULFATE (PF) 4 MG/ML IV SOLN: 2.0000 mg | INTRAVENOUS | Status: DC | PRN

## 2017-11-28 MED ORDER — FENTANYL CITRATE (PF) 250 MCG/5ML IJ SOLN
INTRAMUSCULAR | Status: AC
  Filled 2017-11-28: qty 5

## 2017-11-28 MED ORDER — MIDAZOLAM HCL 5 MG/5ML IJ SOLN
INTRAMUSCULAR | Status: DC | PRN
  Administered 2017-11-28: 2 mg via INTRAVENOUS

## 2017-11-28 MED ORDER — NEOSTIGMINE METHYLSULFATE 5 MG/5ML IV SOSY
PREFILLED_SYRINGE | INTRAVENOUS | Status: AC
  Filled 2017-11-28: qty 5

## 2017-11-28 MED ORDER — ONDANSETRON 4 MG PO TBDP: 4.0000 mg | ORAL_TABLET | Freq: Four times a day (QID) | ORAL | Status: DC | PRN

## 2017-11-28 MED ORDER — GABAPENTIN 300 MG PO CAPS
300.0000 mg | ORAL_CAPSULE | ORAL | Status: AC
  Administered 2017-11-28: 300 mg via ORAL

## 2017-11-28 MED ORDER — FUROSEMIDE 80 MG PO TABS
120.0000 mg | ORAL_TABLET | Freq: Every day | ORAL | Status: DC
  Administered 2017-11-29: 120 mg via ORAL
  Filled 2017-11-28: qty 1

## 2017-11-28 MED ORDER — PROPOFOL 10 MG/ML IV BOLUS
INTRAVENOUS | Status: AC
  Filled 2017-11-28: qty 20

## 2017-11-28 MED ORDER — ACETAMINOPHEN 500 MG PO TABS
ORAL_TABLET | ORAL | Status: AC
  Administered 2017-11-28: 1000 mg via ORAL
  Filled 2017-11-28: qty 2

## 2017-11-28 MED ORDER — ACETAMINOPHEN 650 MG RE SUPP: 650.0000 mg | Freq: Four times a day (QID) | RECTAL | Status: DC | PRN

## 2017-11-28 MED ORDER — POTASSIUM CHLORIDE IN NACL 20-0.9 MEQ/L-% IV SOLN
INTRAVENOUS | Status: DC
  Administered 2017-11-28: 14:00:00 via INTRAVENOUS
  Filled 2017-11-28 (×2): qty 1000

## 2017-11-28 MED ORDER — MORPHINE SULFATE (PF) 2 MG/ML IV SOLN: 2.0000 mg | INTRAVENOUS | Status: DC | PRN

## 2017-11-28 MED ORDER — TRAMADOL HCL 50 MG PO TABS: 50.0000 mg | ORAL_TABLET | Freq: Four times a day (QID) | ORAL | Status: DC | PRN

## 2017-11-28 MED ORDER — LOSARTAN POTASSIUM 50 MG PO TABS
100.0000 mg | ORAL_TABLET | Freq: Every day | ORAL | Status: DC
  Administered 2017-11-28 – 2017-11-29 (×2): 100 mg via ORAL
  Filled 2017-11-28 (×2): qty 2

## 2017-11-28 MED ORDER — NIFEDIPINE ER OSMOTIC RELEASE 30 MG PO TB24
30.0000 mg | ORAL_TABLET | Freq: Every day | ORAL | Status: DC
  Administered 2017-11-29: 30 mg via ORAL
  Filled 2017-11-28: qty 1

## 2017-11-28 MED ORDER — NITROGLYCERIN 0.4 MG SL SUBL: 0.4000 mg | SUBLINGUAL_TABLET | SUBLINGUAL | Status: DC | PRN

## 2017-11-28 MED ORDER — SODIUM CHLORIDE 0.9 % IR SOLN
Status: DC | PRN
  Administered 2017-11-28: 1000 mL

## 2017-11-28 MED ORDER — ONDANSETRON HCL 4 MG/2ML IJ SOLN
INTRAMUSCULAR | Status: AC
  Filled 2017-11-28: qty 2

## 2017-11-28 MED ORDER — AMIODARONE HCL 200 MG PO TABS
200.0000 mg | ORAL_TABLET | Freq: Every day | ORAL | Status: DC
  Administered 2017-11-29: 200 mg via ORAL
  Filled 2017-11-28: qty 1

## 2017-11-28 SURGICAL SUPPLY — 56 items
APL SKNCLS STERI-STRIP NONHPOA (GAUZE/BANDAGES/DRESSINGS) ×2
APPLIER CLIP LOGIC TI 5 (MISCELLANEOUS) IMPLANT
APR CLP MED LRG 33X5 (MISCELLANEOUS)
BENZOIN TINCTURE PRP APPL 2/3 (GAUZE/BANDAGES/DRESSINGS) ×4 IMPLANT
CANISTER SUCT 3000ML PPV (MISCELLANEOUS) ×2 IMPLANT
CLOSURE WOUND 1/2 X4 (GAUZE/BANDAGES/DRESSINGS) ×1
COVER SURGICAL LIGHT HANDLE (MISCELLANEOUS) ×4 IMPLANT
DEVICE SECURE STRAP 25 ABSORB (INSTRUMENTS) ×4 IMPLANT
DISSECT BALLN SPACEMKR + OVL (BALLOONS) ×4
DISSECTOR BALLN SPACEMKR + OVL (BALLOONS) ×2 IMPLANT
DISSECTOR BLUNT TIP ENDO 5MM (MISCELLANEOUS) IMPLANT
DRSG TEGADERM 2-3/8X2-3/4 SM (GAUZE/BANDAGES/DRESSINGS) ×6 IMPLANT
DRSG TEGADERM 4X4.75 (GAUZE/BANDAGES/DRESSINGS) ×4 IMPLANT
ELECT CAUTERY BLADE 6.4 (BLADE) ×2 IMPLANT
ELECT PENCIL ROCKER SW 15FT (MISCELLANEOUS) ×2 IMPLANT
ELECT REM PT RETURN 9FT ADLT (ELECTROSURGICAL) ×4
ELECTRODE REM PT RTRN 9FT ADLT (ELECTROSURGICAL) ×2 IMPLANT
GLOVE BIO SURGEON STRL SZ7 (GLOVE) ×4 IMPLANT
GLOVE BIO SURGEON STRL SZ7.5 (GLOVE) ×2 IMPLANT
GLOVE BIOGEL PI IND STRL 7.5 (GLOVE) ×2 IMPLANT
GLOVE BIOGEL PI IND STRL 8 (GLOVE) IMPLANT
GLOVE BIOGEL PI IND STRL 9 (GLOVE) IMPLANT
GLOVE BIOGEL PI INDICATOR 7.5 (GLOVE) ×4
GLOVE BIOGEL PI INDICATOR 8 (GLOVE) ×4
GLOVE BIOGEL PI INDICATOR 9 (GLOVE) ×2
GLOVE SURG SS PI 6.0 STRL IVOR (GLOVE) ×2 IMPLANT
GLOVE SURG SS PI 8.5 STRL IVOR (GLOVE) ×4
GLOVE SURG SS PI 8.5 STRL STRW (GLOVE) IMPLANT
GOWN STRL REUS W/ TWL LRG LVL3 (GOWN DISPOSABLE) ×6 IMPLANT
GOWN STRL REUS W/ TWL XL LVL3 (GOWN DISPOSABLE) IMPLANT
GOWN STRL REUS W/TWL LRG LVL3 (GOWN DISPOSABLE) ×12
GOWN STRL REUS W/TWL XL LVL3 (GOWN DISPOSABLE) ×4
KIT BASIN OR (CUSTOM PROCEDURE TRAY) ×4 IMPLANT
KIT ROOM TURNOVER OR (KITS) ×4 IMPLANT
MESH 3DMAX LIGHT 4.1X6.2 LT LR (Mesh General) ×2 IMPLANT
MESH 3DMAX LIGHT 4.1X6.2 RT LR (Mesh General) ×2 IMPLANT
MESH VENTRALEX ST 1-7/10 CRC S (Mesh General) ×2 IMPLANT
NS IRRIG 1000ML POUR BTL (IV SOLUTION) ×4 IMPLANT
PAD ARMBOARD 7.5X6 YLW CONV (MISCELLANEOUS) ×8 IMPLANT
PENCIL FOOT CONTROL (ELECTROSURGICAL) ×2 IMPLANT
SCISSORS LAP 5X35 DISP (ENDOMECHANICALS) IMPLANT
SET IRRIG TUBING LAPAROSCOPIC (IRRIGATION / IRRIGATOR) ×2 IMPLANT
SET TROCAR LAP APPLE-HUNT 5MM (ENDOMECHANICALS) ×4 IMPLANT
SLEEVE SURGEON STRL (DRAPES) ×2 IMPLANT
STRIP CLOSURE SKIN 1/2X4 (GAUZE/BANDAGES/DRESSINGS) ×3 IMPLANT
SUT MNCRL AB 4-0 PS2 18 (SUTURE) ×4 IMPLANT
SUT NOVA 1 T20/GS 25DT (SUTURE) ×2 IMPLANT
SUT NOVA NAB GS-21 0 18 T12 DT (SUTURE) ×2 IMPLANT
SUT VIC AB 3-0 SH 27 (SUTURE) ×4
SUT VIC AB 3-0 SH 27XBRD (SUTURE) IMPLANT
TOWEL OR 17X24 6PK STRL BLUE (TOWEL DISPOSABLE) ×4 IMPLANT
TOWEL OR 17X26 10 PK STRL BLUE (TOWEL DISPOSABLE) ×4 IMPLANT
TRAY FOLEY CATH SILVER 16FR (SET/KITS/TRAYS/PACK) ×4 IMPLANT
TRAY LAPAROSCOPIC MC (CUSTOM PROCEDURE TRAY) ×4 IMPLANT
TROCAR BLADELESS 5MM (ENDOMECHANICALS) ×2 IMPLANT
TUBING INSUFFLATION (TUBING) ×6 IMPLANT

## 2017-11-28 NOTE — Interval H&P Note (Signed)
History and Physical Interval Note:  11/28/2017 7:18 AM  Andrew [name]  has presented today for surgery, with the diagnosis of bilateral inguinal hernia / umbilical hernia  The various methods of treatment have been discussed with the patient and family. After consideration of risks, benefits and other options for treatment, the patient has consented to  Procedure(s) with comments: LAPAROSCOPIC BILATERAL INGUINAL HERNIA REPAIR WITH MESH, UMBILICAL HERNIA REPAIR WITH MESH (N/A) - GENERAL AND TAP BLOCK INSERTION OF MESH (N/A) - GENERAL AND TAP BLOCK as a surgical intervention .  The patient's history has been reviewed, patient examined, no change in status, stable for surgery.  I have reviewed the patient's chart and labs.  Questions were answered to the patient's satisfaction.     [name]

## 2017-11-28 NOTE — Transfer of Care (Signed)
Immediate Anesthesia Transfer of Care Note  Patient: Andrew [name]  Procedure(s) Performed: LAPAROSCOPIC BILATERAL INGUINAL HERNIA REPAIR WITH MESH, UMBILICAL HERNIA REPAIR WITH MESH (Bilateral Abdomen) INSERTION OF MESH (N/A )  Patient Location: PACU  Anesthesia Type:General  Level of Consciousness: awake and patient cooperative  Airway & Oxygen Therapy: Patient Spontanous Breathing and Patient connected to face mask oxygen  Post-op Assessment: Report given to RN and Post -op Vital signs reviewed and stable  Post vital signs: Reviewed and stable  Last Vitals:  Vitals:   11/28/17 0700  BP: (!) 145/74  Pulse: 80  Resp: 18  Temp: 36.8 C  SpO2: 95%    Last Pain:  Vitals:   11/28/17 0700  TempSrc: Oral         Complications: No apparent anesthesia complications

## 2017-11-28 NOTE — Op Note (Signed)
Preop diagnosis: Bilateral inguinal hernias and umbilical hernia Postop diagnosis: Same Procedure performed: Laparoscopic preperitoneal bilateral inguinal hernia repairs with mesh and open umbilical hernia repair with mesh Surgeon: Donnie Mesa, MD Assistant: Sharyn Dross RNFA Anesthesia: General endotracheal with bilateral TaP blocks Indications: This is an 83 year old male with multiple medical and cardiac comorbidities who presented with vague RLQ/ right groin pain about 1 month ago. He does not remember any single initiating event. He was evaluated in the ED and a CT stone study was peformed on 08/27/17. This showed no kidney stones, normal bowel and appendix, bilateral renal cysts, and bilateral inguinal hernias containing only fat. The patient describes occasional "soreness" across his lower abdomen/ bilateral groins. Right side more uncomfortable than left. He has been managing this with just Tylenol. The patient requires the use of a cane for ambulation and doesn't spend much time on his feet. He denies any change in his bowel movements.  Due to the persistent discomfort, he would like to have these hernias repaired.  He also has an umbilical incisional hernia that has begun to protrude.  He has a previous midline incision from AAA repair in 2002.    Description of procedure: The patient is brought to the operating room after having T AP blocks placed.  He was placed in the supine position on the operating room table.  After an adequate level of general anesthesia was obtained, a Foley catheter was placed under sterile technique.  The patient's abdomen was shaved, prepped with ChloraPrep and draped in sterile fashion.  A timeout was taken to ensure the proper patient and proper procedure.  We began with the laparoscopic preperitoneal repair.  I made an incision several centimeters below the umbilicus just to the left of midline.  We dissected down to the anterior rectus sheath.  The  rectus sheath was incised transversely.  We entered the retro-muscular space.  We were able to introduce the Spacemaker balloon into this retro-muscular space.  We then inflated the balloon under direct vision with the laparoscope inside the balloon.  We held this in place for several minutes for hemostasis.  The balloon was deflated and removed.  We insufflated CO2 into the preperitoneal space maintaining a maximum pressure of 15 mmHg.  The patient seemed to tolerate this with no adverse effects.  We placed 2 5 mm ports in the lower midline.  We then opened the right preperitoneal space bluntly.  I identified the symphysis pubis as well as the inferior epigastric vessels.  We identified a fairly large indirect defect.  Reduce the fat out of this defect.  There is no direct defect.  Once we were satisfied with our dissection.  We took a right-sided large Bard 3 DMax light mesh and inserted it.  We deployed this across the hernia defects in the right side.  The anterior part of the mesh was secured with secure strap.  The lower edge of the mesh was tucked underneath the edge of the peritoneum.  We then turned our attention to the left side.  There was some oozing on the side from our dissection with the balloon.  We suctioned out any blood.  There is no active bleeding.  We then performed a similar dissection.  He also has indirect defect on the left side.  We reduced fat out of the hernia sac.  A left-sided Bard 3 DMax light mesh was then deployed.  We secured this with secure strap.  We inspected for hemostasis.  We released  our pneumoperitoneum under direct vision.  The trochars were removed.  The fascia was closed with 0 Vicryl.  The incisions were closed with 4-0 Monocryl.  We then turned our attention to the umbilical hernia.  I opened part of his midline incision just adjacent to the umbilicus.  We dissected behind the umbilicus and reduced a hernia sac.  The defect was approximately 1.8 cm across.  We opened  the preperitoneal space for the mesh.  I used a small ventral Lex mesh which we deployed in the preperitoneal space.  This was secured with 0 Novafil sutures.  The fascia was reapproximated with #1 Novafil sutures.  The base of the umbilicus was tacked down with 3-0 Vicryl.  3-0 Vicryl was used to close subcutaneous tissues and 4-0 Monocryl was used to close the skin.  Benzoin Steri-Strips were applied.  The patient was then extubated and brought to recovery room in stable condition.  All sponge, instrument, and needle counts are correct.  Imogene Burn. Georgette Dover, MD, [facility] Surgery  General/ Trauma Surgery  11/28/2017 9:53 AM

## 2017-11-28 NOTE — Anesthesia Postprocedure Evaluation (Signed)
Anesthesia Post Note  Patient: Andrew [name]  Procedure(s) Performed: LAPAROSCOPIC BILATERAL INGUINAL HERNIA REPAIR WITH MESH, UMBILICAL HERNIA REPAIR WITH MESH (Bilateral Abdomen) INSERTION OF MESH (N/A )     Patient location during evaluation: PACU Anesthesia Type: General and Regional Level of consciousness: awake and alert Pain management: pain level controlled Vital Signs Assessment: post-procedure vital signs reviewed and stable Respiratory status: spontaneous breathing, nonlabored ventilation, respiratory function stable and patient connected to nasal cannula oxygen Cardiovascular status: blood pressure returned to baseline and stable Postop Assessment: no apparent nausea or vomiting Anesthetic complications: no    Last Vitals:  Vitals:   11/28/17 1228 11/28/17 1245  BP:  119/62  Pulse: (!) 56 64  Resp: 16 16  Temp: (!) 36.2 C   SpO2: 94% 93%    Last Pain:  Vitals:   11/28/17 1245  TempSrc: Oral  PainSc:                  Effie Berkshire

## 2017-11-28 NOTE — Anesthesia Procedure Notes (Signed)
Anesthesia Regional Block: TAP block   Pre-Anesthetic Checklist: ,, timeout performed, Correct Patient, Correct Site, Correct Laterality, Correct Procedure, Correct Position, site marked, Risks and benefits discussed,  Surgical consent,  Pre-op evaluation,  At surgeon's request and post-op pain management  Laterality: Right  Prep: chloraprep       Needles:  Injection technique: Single-shot  Needle Type: Echogenic Needle     Needle Length: 9cm  Needle Gauge: 21     Additional Needles:   Procedures:,,,, ultrasound used (permanent image in chart),,,,  Narrative:  Start time: 11/28/2017 7:23 AM End time: 11/28/2017 7:30 AM Injection made incrementally with aspirations every 5 mL.  Performed by: Personally  Anesthesiologist: Effie Berkshire, MD  Additional Notes: Patient tolerated the procedure well. Local anesthetic introduced in an incremental fashion under minimal resistance after negative aspirations. No paresthesias were elicited. After completion of the procedure, no acute issues were identified and patient continued to be monitored by RN.

## 2017-11-28 NOTE — Anesthesia Procedure Notes (Signed)
Procedure Name: Intubation Date/Time: 11/28/2017 7:48 AM Performed by: Audry Pili, MD Pre-anesthesia Checklist: Patient identified, Emergency Drugs available, Suction available and Patient being monitored Patient Re-evaluated:Patient Re-evaluated prior to induction Oxygen Delivery Method: Circle system utilized Preoxygenation: Pre-oxygenation with 100% oxygen Induction Type: IV induction Ventilation: Mask ventilation without difficulty and Oral airway inserted - appropriate to patient size Laryngoscope Size: Glidescope and 4 Grade View: Grade I Tube type: Oral Tube size: 7.5 mm Number of attempts: 3 (First attempt by CRNA with Sabra Heck 3, grade 2B view. Second attempt by Dr. Fransisco Beau with Mil 3, grade 2B view. Third attempt with Glidescope) Airway Equipment and Method: Oral airway,  Video-laryngoscopy and Rigid stylet Placement Confirmation: ETT inserted through vocal cords under direct vision,  positive ETCO2 and breath sounds checked- equal and bilateral Secured at: 23 cm Tube secured with: Tape Dental Injury: Teeth and Oropharynx as per pre-operative assessment  Difficulty Due To: Difficult Airway- due to reduced neck mobility and Difficulty was anticipated Comments: Significant periglottic redundant tissue made visualization difficult, in addition to reduced neck mobility. Recommend glidescope as first line for future intubations.

## 2017-11-28 NOTE — Progress Notes (Signed)
Patient arrived 6n11, alert and oriented, VSS, IV fluids running, scds on. Patient noted to have a few lap sites on abdomen with steri strips and tegaderm, one site with new drainage. Patient reports mild pain. On 2L of oxygen at the moment. Family notified from PACU nurse that he was brought to [facility]. Oriented to room and staff will continue to monitor.

## 2017-11-28 NOTE — Anesthesia Procedure Notes (Signed)
Anesthesia Regional Block: TAP block   Pre-Anesthetic Checklist: ,, timeout performed, Correct Patient, Correct Site, Correct Laterality, Correct Procedure, Correct Position, site marked, Risks and benefits discussed,  Surgical consent,  Pre-op evaluation,  At surgeon's request and post-op pain management  Laterality: Left  Prep: chloraprep       Needles:  Injection technique: Single-shot  Needle Type: Echogenic Needle     Needle Length: 9cm  Needle Gauge: 21     Additional Needles:   Procedures:,,,, ultrasound used (permanent image in chart),,,,  Narrative:  Start time: 11/28/2017 7:15 AM End time: 11/28/2017 7:22 AM Injection made incrementally with aspirations every 5 mL.  Performed by: Personally  Anesthesiologist: Effie Berkshire, MD  Additional Notes: Patient tolerated the procedure well. Local anesthetic introduced in an incremental fashion under minimal resistance after negative aspirations. No paresthesias were elicited. After completion of the procedure, no acute issues were identified and patient continued to be monitored by RN.

## 2017-11-29 ENCOUNTER — Encounter (HOSPITAL_COMMUNITY): Payer: Self-pay | Admitting: Surgery

## 2017-11-29 DIAGNOSIS — K4020 Bilateral inguinal hernia, without obstruction or gangrene, not specified as recurrent: Secondary | ICD-10-CM | POA: Diagnosis not present

## 2017-11-29 LAB — CBC
HCT: 37.4 % — ABNORMAL LOW (ref 39.0–52.0)
Hemoglobin: 12.2 g/dL — ABNORMAL LOW (ref 13.0–17.0)
MCH: 32.7 pg (ref 26.0–34.0)
MCHC: 32.6 g/dL (ref 30.0–36.0)
MCV: 100.3 fL — AB (ref 78.0–100.0)
PLATELETS: 150 10*3/uL (ref 150–400)
RBC: 3.73 MIL/uL — AB (ref 4.22–5.81)
RDW: 13 % (ref 11.5–15.5)
WBC: 10.9 10*3/uL — AB (ref 4.0–10.5)

## 2017-11-29 LAB — BASIC METABOLIC PANEL
Anion gap: 13 (ref 5–15)
BUN: 37 mg/dL — AB (ref 6–20)
CALCIUM: 8.9 mg/dL (ref 8.9–10.3)
CO2: 29 mmol/L (ref 22–32)
CREATININE: 1.76 mg/dL — AB (ref 0.61–1.24)
Chloride: 95 mmol/L — ABNORMAL LOW (ref 101–111)
GFR calc Af Amer: 38 mL/min — ABNORMAL LOW (ref >60)
GFR calc non Af Amer: 33 mL/min — ABNORMAL LOW (ref >60)
Glucose, Bld: 175 mg/dL — ABNORMAL HIGH (ref 65–99)
Potassium: 3.9 mmol/L (ref 3.5–5.1)
Sodium: 137 mmol/L (ref 135–145)

## 2017-11-29 MED ORDER — HYDROCODONE-ACETAMINOPHEN 5-325 MG PO TABS: 1.0000 | ORAL_TABLET | ORAL | 0 refills | Status: DC | PRN

## 2017-11-29 NOTE — [place] FL2 (Signed)
[place] MEDICAID FL2 LEVEL OF CARE SCREENING TOOL     IDENTIFICATION  Patient Name: Andrew [name] Birthdate: Feb 03, 1928 Sex: male Admission Date (Current Location): 11/28/2017  [facility] and [place] Number:  Herbalist and Address:  [facility] [place], [place] [address] [place], [place] [address]      Provider Number: [id number]  Attending Physician Name and Address:  Donnie Mesa, MD  Relative Name and Phone Number:  Dervin Vore, spouse, [phone]    Current Level of Care: Hospital Recommended Level of Care: [place] Prior Approval Number:    Date Approved/Denied:   PASRR Number: [id number]  Discharge Plan: SNF    Current Diagnoses: Patient Active Problem List   Diagnosis Date Noted  . Bilateral recurrent inguinal hernias 11/28/2017  . Calf pain 05/25/2016  . Carotid artery stenosis 01/12/2016  . Chronic systolic CHF (congestive heart failure) ([place]) 12/02/2015  . Cerebellar infarct ([place]) 10/16/2015  . 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 ([place]) 10/16/2015  . PVD (peripheral vascular disease) ([place])   . Hereditary and idiopathic peripheral neuropathy 10/02/2015  . Peripelvic (lymphatic) cyst   . Pernicious anemia 07/15/2015  . Bilateral renal cysts 07/12/2015  . Lung nodule, 4mm RML CT 07/12/15 07/12/2015  . Constipation 07/12/2015  . Kidney stone on right side 07/12/2015  . Lower abdominal pain   . Stage 3 chronic kidney disease ([place]) 06/14/2015  . Chronic diastolic CHF (congestive heart failure) ([place]) 05/28/2015  . Paroxysmal atrial fibrillation ([place]) 05/28/2015  . Routine general medical examination at a health care facility 03/24/2015  . AAA (abdominal aortic aneurysm) without rupture ([place]) 09/30/2014  . Left-sided low back pain without sciatica 07/21/2014  . Cardiomyopathy, ischemic 05/23/2014  . DM (diabetes mellitus), type 2 with renal complications ([place]) 53/64/6803  . CORONARY  ATHEROSCLEROSIS NATIVE CORONARY ARTERY 11/09/2010  . ANXIETY DEPRESSION 03/18/2010  . Hypothyroidism 02/03/2009  . Hyperlipidemia with target LDL less than 100 06/14/2007  . Essential hypertension 06/14/2007  . GERD 06/14/2007    Orientation RESPIRATION BLADDER Height & Weight     Self, Time, Situation, Place  O2(nasal cannula 2L) Continent Weight: 189 lb (85.7 kg) Height:  5\' 7"  (170.2 cm)  BEHAVIORAL SYMPTOMS/MOOD NEUROLOGICAL BOWEL NUTRITION STATUS      Continent Diet(heart diet; please see DC summary)  AMBULATORY STATUS COMMUNICATION OF NEEDS Skin   Limited Assist(mod assist) Verbally Surgical wounds(closed incision, abdomen)                       Personal Care Assistance Level of Assistance  Bathing, Feeding, Dressing Bathing Assistance: Limited assistance Feeding assistance: Independent Dressing Assistance: Limited assistance     Functional Limitations Info  Sight, Hearing, Speech Sight Info: Adequate Hearing Info: Impaired Speech Info: Adequate    SPECIAL CARE FACTORS FREQUENCY  PT (By licensed PT)     PT Frequency: 5x/week              Contractures Contractures Info: Not present    Additional Factors Info  Code Status, Allergies Code Status Info: Full Allergies Info: Metoprolol, Metformin And Related           Current Medications (11/29/2017):  This is the current hospital active medication list Current Facility-Administered Medications  Medication Dose Route Frequency Provider Last Rate Last Dose  . acetaminophen (TYLENOL) tablet 650 mg  650 mg Oral Q6H PRN Donnie Mesa, MD       Or  . acetaminophen (TYLENOL) suppository 650  mg  650 mg Rectal Q6H PRN Donnie Mesa, MD      . ALPRAZolam Duanne Moron) tablet 0.25 mg  0.25 mg Oral BID PRN Donnie Mesa, MD      . amiodarone (PACERONE) tablet 200 mg  200 mg Oral Daily Donnie Mesa, MD   200 mg at 11/29/17 7628  . carvedilol (COREG) tablet 12.5 mg  12.5 mg Oral BID Charna Archer Donnie Mesa, MD   12.5 mg  at 11/29/17 3151  . diphenhydrAMINE (BENADRYL) 12.5 MG/5ML elixir 12.5 mg  12.5 mg Oral Q6H PRN Donnie Mesa, MD       Or  . diphenhydrAMINE (BENADRYL) injection 12.5 mg  12.5 mg Intravenous Q6H PRN Donnie Mesa, MD      . FLUoxetine (PROZAC) capsule 20 mg  20 mg Oral Daily Donnie Mesa, MD   20 mg at 11/29/17 7616  . furosemide (LASIX) tablet 120 mg  120 mg Oral QPC breakfast Donnie Mesa, MD   120 mg at 11/29/17 0737  . furosemide (LASIX) tablet 80 mg  80 mg Oral QPM Donnie Mesa, MD   80 mg at 11/28/17 1804  . gabapentin (NEURONTIN) capsule 300 mg  300 mg Oral BID Donnie Mesa, MD   300 mg at 11/29/17 1062  . hydrALAZINE (APRESOLINE) injection 10 mg  10 mg Intravenous Q2H PRN Donnie Mesa, MD      . HYDROcodone-acetaminophen (NORCO/VICODIN) 5-325 MG per tablet 1-2 tablet  1-2 tablet Oral Q4H PRN Donnie Mesa, MD   2 tablet at 11/28/17 2136  . losartan (COZAAR) tablet 100 mg  100 mg Oral Daily Donnie Mesa, MD   100 mg at 11/29/17 6948  . morphine 4 MG/ML injection 2-4 mg  2-4 mg Intravenous Q2H PRN Donnie Mesa, MD      . NIFEdipine (PROCARDIA-XL/ADALAT-CC/NIFEDICAL-XL) 24 hr tablet 30 mg  30 mg Oral Daily Donnie Mesa, MD   30 mg at 11/29/17 5462  . nitroGLYCERIN (NITROSTAT) SL tablet 0.4 mg  0.4 mg Sublingual Q5 min PRN Donnie Mesa, MD      . ondansetron (ZOFRAN-ODT) disintegrating tablet 4 mg  4 mg Oral Q6H PRN Donnie Mesa, MD       Or  . ondansetron (ZOFRAN) injection 4 mg  4 mg Intravenous Q6H PRN Donnie Mesa, MD      . senna-docusate (Senokot-S) tablet 1 tablet  1 tablet Oral QHS PRN Donnie Mesa, MD         Discharge Medications: Please see discharge summary for a list of discharge medications.  Relevant Imaging Results:  Relevant Lab Results:   Additional Information SSN: [id number]  Estanislado Emms, LCSW

## 2017-11-29 NOTE — Clinical Social Work Note (Signed)
Clinical Social Work Assessment  Patient Details  Name: Andrew [name] MRN: [id number] Date of Birth: 09-Dec-1928  Date of referral:  11/29/17               Reason for consult:  Facility Placement                Permission sought to share information with:  Facility Sport and exercise psychologist, Family Supports Permission granted to share information::  Yes, Verbal Permission Granted  Name::     Andrew [name]  Agency::  SNFs  Relationship::  spouse  Contact Information:  [phone]  Housing/Transportation Living arrangements for the past 2 months:  [place] of Information:  Patient, Spouse Patient Interpreter Needed:  None Criminal Activity/Legal Involvement Pertinent to Current Situation/Hospitalization:  No - Comment as needed Significant Relationships:  Spouse Lives with:  Spouse Do you feel safe going back to the place where you live?  Yes Need for family participation in patient care:  No (Coment)  Care giving concerns: Patient from home with spouse. PT recommending SNF.   Social Worker assessment / plan: CSW spoke to patient's wife via phone and met with patient at bedside. Patient alert and oriented. Wife hopeful for SNF placement, indicating that patient would have difficulty moving around the house, as they live in a split level home. 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 Patient and wife choose [facility]. CSW sent initial referrals and left message for [facility] admissions, awaiting call back. CSW to support with discharge to SNF today.  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  Patient/Family's Understanding of and Emotional Response to Diagnosis, Current Treatment, and Prognosis:  Patient and wife with understanding of patient's condition and agreeable to SNF.  Emotional Assessment Appearance:  Appears stated age Attitude/Demeanor/Rapport:  Other(appropriate) Affect (typically observed):  Calm, Pleasant Orientation:  Oriented to Self, Oriented to Place, Oriented to  Time, Oriented to Situation Alcohol / Substance use:  Not Applicable Psych involvement (Current and /or in the community):  No (Comment)  Discharge Needs  Concerns to be addressed:  Care Coordination, Discharge Planning Concerns Readmission within the last 30 days:  No Current discharge risk:  Physical Impairment Barriers to Discharge:  No Barriers Identified   Andrew Emms, LCSW 11/29/2017, 12:23 PM

## 2017-11-29 NOTE — Progress Notes (Signed)
Patient was d/c home this morning but MD wanted PT consult and after consult, PT is recommending SNF/Rehab. Since MD Tsuei is off call now, I notified PA [place]. She said she would change the d/c order. I also notified social work about the change, she said she would come in and talk with the patient.

## 2017-11-29 NOTE — Progress Notes (Signed)
Patient being transferred to [facility] facility. Report called to facility, patient stable upon d/c, all belongings taken with him. Wife was called by social work earlier and notified of transfer.

## 2017-11-29 NOTE — Evaluation (Addendum)
Physical Therapy Evaluation Patient Details Name: Andrew [name] MRN: [id number] DOB: 02-Sep-1928 Today's Date: 11/29/2017   History of Present Illness  Andrew [name]  has presented today for surgery, with the diagnosis of bilateral inguinal hernia / umbilical hernia  The various methods of treatment have been discussed with the patient and family. After consideration of risks, benefits and other options for treatment, the patient has consented to  Procedure(s) with comments:  Clinical Impression  Patient received seated in chair, pleasant but confused and willing to work with skilled PT services. Patient at 88-89% on Room air, placed on 2LPM O2 and increased to 92% on 2LPM [place]; very distractable and impulsive this morning, requiring cues for safety. Patient requires Mod assist and Max cues for safety during gait today, very impulsive and very unsafe. Noted O2 desat to 89% on 2LPM during gait as well, recovered with rest. Strongly recommend SNF moving forward due to poor safety awareness and high risk for fall at home. RN aware of recommendation by PT and safety concerns. Patient left sitting upright in chair with alarm set, O2 on 2LPM, and all other needs met.    Follow Up Recommendations SNF    Equipment Recommendations  None recommended by PT    Recommendations for Other Services       Precautions / Restrictions Precautions Precautions: Fall Precaution Comments: abdominal incisions Restrictions Weight Bearing Restrictions: No      Mobility  Bed Mobility               General bed mobility comments: DNT, patient received in chair   Transfers Overall transfer level: Needs assistance Equipment used: Rolling walker (2 wheeled) Transfers: Sit to/from Stand Sit to Stand: Min assist         General transfer comment: very unstady, cues for safety   Ambulation/Gait Ambulation/Gait assistance: Mod assist Ambulation Distance (Feet): 30 Feet Assistive device: Rolling  walker (2 wheeled) Gait Pattern/deviations: Step-through pattern;Decreased dorsiflexion - right;Decreased dorsiflexion - left;Trunk flexed     General Gait Details: very unsteady, Mod assist/Max cues for safety with walker   Stairs            Wheelchair Mobility    Modified Rankin (Stroke Patients Only)       Balance Overall balance assessment: Needs assistance Sitting-balance support: No upper extremity supported Sitting balance-Leahy Scale: Fair     Standing balance support: Bilateral upper extremity supported Standing balance-Leahy Scale: Poor                               Pertinent Vitals/Pain Pain Assessment: Faces Faces Pain Scale: No hurt    Home Living Family/patient expects to be discharged to:: Private residence Living Arrangements: Spouse/significant other Available Help at Discharge: Family             Additional Comments: patient distractable and impulsive, poor historian; uanble to establish prior level of function/prior equipment     Prior Function Level of Independence: Independent with assistive device(s)               Hand Dominance        Extremity/Trunk Assessment        Lower Extremity Assessment Lower Extremity Assessment: Overall WFL for tasks assessed    Cervical / Trunk Assessment Cervical / Trunk Assessment: Kyphotic  Communication   Communication: HOH  Cognition Arousal/Alertness: Awake/alert Behavior During Therapy: Impulsive;Restless  General Comments General comments (skin integrity, edema, etc.): very unsteady, max cues for safety     Exercises     Assessment/Plan    PT Assessment Patient needs continued PT services  PT Problem List Decreased mobility;Decreased safety awareness;Decreased coordination;Decreased activity tolerance;Decreased balance       PT Treatment Interventions DME instruction;Therapeutic activities;Gait  training;Therapeutic exercise;Patient/family education;Stair training;Balance training;Functional mobility training;Neuromuscular re-education;Manual techniques    PT Goals (Current goals can be found in the Care Plan section)  Acute Rehab PT Goals PT Goal Formulation: Patient unable to participate in goal setting    Frequency Min 3X/week   Barriers to discharge        Co-evaluation               AM-PAC PT "6 Clicks" Daily Activity  Outcome Measure Difficulty turning over in bed (including adjusting bedclothes, sheets and blankets)?: Unable Difficulty moving from lying on back to sitting on the side of the bed? : Unable Difficulty sitting down on and standing up from a chair with arms (e.g., wheelchair, bedside commode, etc,.)?: Unable Help needed moving to and from a bed to chair (including a wheelchair)?: A Lot Help needed walking in hospital room?: A Lot Help needed climbing 3-5 steps with a railing? : A Lot 6 Click Score: 9    End of Session Equipment Utilized During Treatment: Gait belt;Oxygen Activity Tolerance: Patient tolerated treatment well Patient left: in chair;with call bell/phone within reach;with chair alarm set Nurse Communication: Mobility status;Other (comment)(PT safety concerns ) PT Visit Diagnosis: Unsteadiness on feet (R26.81);Other abnormalities of gait and mobility (R26.89);Repeated falls (R29.6);History of falling (Z91.81)    Time: 5284-1324 PT Time Calculation (min) (ACUTE ONLY): 25 min   Charges:   PT Evaluation $PT Eval Low Complexity: 1 Low PT Treatments $Gait Training: 8-22 mins   PT G Codes:   PT G-Codes **NOT FOR INPATIENT CLASS** Functional Assessment Tool Used: AM-PAC 6 Clicks Basic Mobility;Clinical judgement Functional Limitation: Mobility: Walking and moving around Mobility: Walking and Moving Around Current Status (M0102): At least 60 percent but less than 80 percent impaired, limited or restricted Mobility: Walking and Moving  Around Goal Status [phone]): At least 40 percent but less than 60 percent impaired, limited or restricted    Deniece Ree PT, DPT, CBIS  Supplemental Physical Therapist [facility]

## 2017-11-29 NOTE — Discharge Instructions (Signed)
[facility]  UMBILICAL AND INGUINAL HERNIA REPAIR: POST OP INSTRUCTIONS  Always review your discharge instruction sheet given to you by the facility where your surgery was performed. IF YOU HAVE DISABILITY OR FAMILY LEAVE FORMS, YOU MUST BRING THEM TO THE OFFICE FOR PROCESSING.   DO NOT GIVE THEM TO YOUR DOCTOR.  1. A  prescription for pain medication may be given to you upon discharge.  Take your pain medication as prescribed, if needed.  If narcotic pain medicine is not needed, then you may take acetaminophen (Tylenol) or ibuprofen (Advil) as needed. 2. Take your usually prescribed medications unless otherwise directed. 3. If you need a refill on your pain medication, please contact your pharmacy.  They will contact our office to request authorization. Prescriptions will not be filled after 5 pm or on week-ends. 4. You should follow a light diet the first 24 hours after arrival home, such as soup and crackers, etc.  Be sure to include lots of fluids daily.  Resume your normal diet the day after surgery. 5. Most patients will experience some swelling and bruising around the umbilicus or in the groin and scrotum.  Ice packs and reclining will help.  Swelling and bruising can take several days to resolve.  6. It is common to experience some constipation if taking pain medication after surgery.  Increasing fluid intake and taking a stool softener (such as Colace) will usually help or prevent this problem from occurring.  A mild laxative (Milk of Magnesia or Miralax) should be taken according to package directions if there are no bowel movements after 48 hours. 7. Unless discharge instructions indicate otherwise, you may remove your bandages 24-48 hours after surgery, and you may shower at that time.  You will have steri-strips (small skin tapes) in place directly over the incision.  These strips should be left on the skin for 7-10 days. 8. ACTIVITIES:  You may resume regular (light)  daily activities beginning the next day--such as daily self-care, walking, climbing stairs--gradually increasing activities as tolerated.  You may have sexual intercourse when it is comfortable.  Refrain from any heavy lifting or straining until approved by your doctor. a. You may drive when you are no longer taking prescription pain medication, you can comfortably wear a seatbelt, and you can safely maneuver your car and apply brakes. b. RETURN TO WORK:  2-3 weeks with light duty - no lifting over 15 lbs. 9. You should see your doctor in the office for a follow-up appointment approximately 2-3 weeks after your surgery.  Make sure that you call for this appointment within a day or two after you arrive home to insure a convenient appointment time. 10. OTHER INSTRUCTIONS:  __________________________________________________________________________________________________________________________________________________________________________________________  WHEN TO CALL YOUR DOCTOR: 1. Fever over 101.0 2. Inability to urinate 3. Nausea and/or vomiting 4. Extreme swelling or bruising 5. Continued bleeding from incision. 6. Increased pain, redness, or drainage from the incision  The clinic staff is available to answer your questions during regular business hours.  Please dont hesitate to call and ask to speak to one of the nurses for clinical concerns.  If you have a medical emergency, go to the nearest emergency room or call 911.  A surgeon from [facility] is always on call at the hospital   [address], [place], [place], [place]  [address] ?  [address] [place], [place], [place]   [address] [phone]    FAX [phone] Web  site: [email or web address]    

## 2017-11-29 NOTE — Clinical Social Work Placement (Signed)
   CLINICAL SOCIAL WORK PLACEMENT  NOTE  Date:  11/29/2017  Patient Details  Name: Andrew [name] MRN: [id number] Date of Birth: 1928-08-15  Clinical Social Work is seeking post-discharge placement for this patient at the [place] level of care (*CSW will initial, date and re-position this form in  chart as items are completed):  Yes   Patient/family provided with [place] Work Department's list of facilities offering this level of care within the geographic area requested by the patient (or if unable, by the patient's family).  Yes   Patient/family informed of their freedom to choose among providers that offer the needed level of care, that participate in Medicare, Medicaid or managed care program needed by the patient, have an available bed and are willing to accept the patient.  Yes   Patient/family informed of [facility]'s ownership interest in [facility] and [facility], as well as of the fact that they are under no obligation to receive care at these facilities.  PASRR submitted to EDS on 11/29/17     PASRR number received on 11/29/17     Existing PASRR number confirmed on       FL2 transmitted to all facilities in geographic area requested by pt/family on 11/29/17     FL2 transmitted to all facilities within larger geographic area on       Patient informed that his/her managed care company has contracts with or will negotiate with certain facilities, including the following:  [facility]     Yes   Patient/family informed of bed offers received.  Patient chooses bed at [facility]     Physician recommends and patient chooses bed at      Patient to be transferred to [facility] on 11/29/17.  Patient to be transferred to facility by [facility]AR     Patient family notified on 11/29/17 of transfer.  Name of family member notified:  Andrew [name], spouse     PHYSICIAN Please sign FL2     Additional Comment:     _______________________________________________ [place]anislado Emms, LCSW 11/29/2017, 2:29 PM

## 2017-11-29 NOTE — Progress Notes (Signed)
Patient will discharge to [facility] Anticipated discharge date: 11/29/17 Family notified: Less [place], spouse Transportation by: Corey Harold  Nurse to call report to [phone].   CSW signing off.  Estanislado Emms, [place]  Clinical Social Worker

## 2017-11-29 NOTE — Discharge Summary (Signed)
Physician Discharge Summary  Patient ID: Andrew [name] MRN: [id number] DOB/AGE: 26-Apr-1928 83 y.o.  Admit date: 11/28/2017 Discharge date: 11/29/2017  Admission Diagnoses:  Bilateral inguinal hernias/ umbilical incisional hernia  Discharge Diagnoses:  Active Problems:   Bilateral recurrent inguinal hernias   Discharged Condition: good  Hospital Course: 11/28/17 - laparoscopic preperitoneal bilateral inguinal hernia repairs with mesh and open umbilical hernia repair with mesh.  Patient kept overnight for comorbidities and cardiac monitoring.  He has been voiding well and tolerating diet with minimal pain.  We have asked PT to evaluate him prior to discharge.  He does use a walker or cane prior to surgery.  Consults: physical therapy.    Treatments: surgery: as above  Discharge Exam: Blood pressure (!) 151/85, pulse 73, temperature 98.4 F (36.9 C), temperature source Oral, resp. rate 16, height 5\' 7"  (1.702 m), weight 85.7 kg (189 lb), SpO2 93 %. General appearance: alert, cooperative and no distress Resp: clear to auscultation bilaterally Cardio: regular rate and rhythm, S1, S2 normal, no murmur, click, rub or gallop GI: soft, minimal incisional tenderness; slight oozing underneath each dressing GU - slight swelling; no bruising  Disposition: 01-Home or Self Care  Discharge Instructions    Call MD for:  persistant nausea and vomiting   Complete by:  As directed    Call MD for:  redness, tenderness, or signs of infection (pain, swelling, redness, odor or green/yellow discharge around incision site)   Complete by:  As directed    Call MD for:  severe uncontrolled pain   Complete by:  As directed    Call MD for:  temperature >100.4   Complete by:  As directed    Diet general   Complete by:  As directed    Driving Restrictions   Complete by:  As directed    Do not drive while taking pain medications   Increase activity slowly   Complete by:  As directed    May shower /  Bathe   Complete by:  As directed      Allergies as of 11/29/2017      Reactions   Metoprolol Shortness Of Breath, Palpitations, Other (See Comments)   Heart starts racing. Shallow breathing    Metformin And Related Nausea And Vomiting      Medication List    TAKE these medications   ACCU-CHEK AVIVA PLUS test strip Generic drug:  glucose blood CHECK BLOOD SUGAR 4 TIMES A DAY.   acetaminophen 500 MG tablet Commonly known as:  TYLENOL Take 1,000 mg by mouth every 8 (eight) hours as needed for moderate pain.   ALPRAZolam 0.5 MG tablet Commonly known as:  XANAX TAKE 1/2 TABLET TWICE DAILY AS NEEDED FOR ANXIETY. What changed:  See the new instructions.   amiodarone 200 MG tablet Commonly known as:  PACERONE TAKE 1 TABLET ONCE DAILY. What changed:    how much to take  how to take this  when to take this   atorvastatin 80 MG tablet Commonly known as:  LIPITOR TAKE 1 TABLET ONCE DAILY.   carvedilol 12.5 MG tablet Commonly known as:  COREG TAKE 1 TABLET TWICE DAILY. What changed:    how much to take  how to take this  when to take this   cilostazol 100 MG tablet Commonly known as:  PLETAL TAKE 1 TABLET TWICE DAILY. What changed:    how much to take  how to take this  when to take this   ELIQUIS 2.5 MG  Tabs tablet Generic drug:  apixaban TAKE 1 TABLET TWICE DAILY. What changed:    how much to take  how to take this  when to take this   ezetimibe 10 MG tablet Commonly known as:  ZETIA TAKE (1) TABLET DAILY.   FLUoxetine 20 MG capsule Commonly known as:  PROZAC TAKE (1) CAPSULE DAILY. What changed:  See the new instructions.   furosemide 80 MG tablet Commonly known as:  LASIX TAKE 1 & 1/2 TABLETS IN THE MORNING AND 1 EACH EVENING AS DIRECTED. What changed:  See the new instructions.   HYDROcodone-acetaminophen 5-325 MG tablet Commonly known as:  NORCO/VICODIN Take 1 tablet by mouth every 4 (four) hours as needed for moderate pain.    losartan 100 MG tablet Commonly known as:  COZAAR TAKE 1 TABLET ONCE DAILY. What changed:    how much to take  how to take this  when to take this   multivitamin tablet Take 1 tablet by mouth daily.   NIFEdipine 30 MG 24 hr tablet Commonly known as:  PROCARDIA-XL/ADALAT CC Take 30 mg by mouth daily.   NITROSTAT 0.4 MG SL tablet Generic drug:  nitroGLYCERIN DISSOLVE 1 TABLET UNDER TONGUE AS NEEDED FOR CHEST PAIN,MAY REPEAT IN5 MINUTES FOR 2 DOSES. What changed:  See the new instructions.   temazepam 30 MG capsule Commonly known as:  RESTORIL TAKE 1 CAPSULE AT BEDTIME AS NEEDED FOR SLEEP. What changed:  See the new instructions.   traMADol 50 MG tablet Commonly known as:  ULTRAM Take 1 tablet (50 mg total) by mouth every 6 (six) hours as needed. What changed:  reasons to take this      Follow-up Information    Donnie Mesa, MD. Schedule an appointment as soon as possible for a visit in 1 month(s).   Specialty:  General Surgery Contact information: [address] [place] [address] [phone]           Signed: [name] 11/29/2017, 8:59 AM

## 2017-12-07 ENCOUNTER — Telehealth: Payer: Self-pay | Admitting: *Deleted

## 2017-12-07 NOTE — Telephone Encounter (Signed)
Spoke with Mrs. Hellmer on the phone about Mr. Knisley potential discharge from [place].  The plan is for Mr. Purdum to discharge home with home PT tomorrow, December 14th.  Mrs. Raliegh Ip is concerned because Kr. K's SpO2 drops to the low to mid 80s with ambulation and then goes above 90% at rest.  She states that Mr. Colley is insistent that he will not have oxygen at home.  She is getting an SpO2 monitor for home.  Spoke with [name].  [facility] appointment made for next Thursday.  Confirmed appointment with wife.

## 2017-12-10 ENCOUNTER — Other Ambulatory Visit: Payer: Self-pay | Admitting: Family Medicine

## 2017-12-10 ENCOUNTER — Other Ambulatory Visit: Payer: Self-pay | Admitting: Internal Medicine

## 2017-12-11 ENCOUNTER — Telehealth: Payer: Self-pay | Admitting: Family Medicine

## 2017-12-11 NOTE — Telephone Encounter (Signed)
Copied from [place] [id number]. Topic: Quick Communication - Rx Refill/Question >> Dec 11, 2017  2:21 PM Robina Ade, Helene Kelp D wrote: Has the patient contacted their pharmacy? Yes (Agent: If no, request that the patient contact the pharmacy for the refill.) Preferred Pharmacy (with phone number or street name): [place], [place]. Agent: Please be advised that RX refills may take up to 3 business days. We ask that you follow-up with your pharmacy. Patient needs refill on his ALPRAZolam (XANAX) 0.5 MG tablet.

## 2017-12-12 NOTE — Telephone Encounter (Signed)
Medication refill request for Xanax / LOV 09/11/17 with Dr. Hulan Saas /

## 2017-12-12 NOTE — Telephone Encounter (Signed)
Patient is transferring care to [place]. I don't see where he had a transfer care appointment yet, He has seen him for other things.

## 2017-12-14 ENCOUNTER — Encounter (HOSPITAL_COMMUNITY): Payer: Self-pay | Admitting: Cardiology

## 2017-12-14 ENCOUNTER — Ambulatory Visit (HOSPITAL_COMMUNITY)
Admission: RE | Admit: 2017-12-14 | Discharge: 2017-12-14 | Disposition: A | Discharge status: Home / Self Care | Payer: Medicare Other | Source: Ambulatory Visit | Attending: Cardiology | Admitting: Cardiology

## 2017-12-14 VITALS — BP 118/71 | HR 68 | Wt 202.0 lb

## 2017-12-14 DIAGNOSIS — Z8249 Family history of ischemic heart disease and other diseases of the circulatory system: Secondary | ICD-10-CM | POA: Diagnosis not present

## 2017-12-14 DIAGNOSIS — Z951 Presence of aortocoronary bypass graft: Secondary | ICD-10-CM | POA: Insufficient documentation

## 2017-12-14 DIAGNOSIS — E785 Hyperlipidemia, unspecified: Secondary | ICD-10-CM | POA: Diagnosis not present

## 2017-12-14 DIAGNOSIS — I2582 Chronic total occlusion of coronary artery: Secondary | ICD-10-CM | POA: Insufficient documentation

## 2017-12-14 DIAGNOSIS — I129 Hypertensive chronic kidney disease with stage 1 through stage 4 chronic kidney disease, or unspecified chronic kidney disease: Secondary | ICD-10-CM | POA: Diagnosis not present

## 2017-12-14 DIAGNOSIS — N189 Chronic kidney disease, unspecified: Secondary | ICD-10-CM | POA: Insufficient documentation

## 2017-12-14 DIAGNOSIS — I480 Paroxysmal atrial fibrillation: Secondary | ICD-10-CM

## 2017-12-14 DIAGNOSIS — E1151 Type 2 diabetes mellitus with diabetic peripheral angiopathy without gangrene: Secondary | ICD-10-CM | POA: Diagnosis not present

## 2017-12-14 DIAGNOSIS — Z87891 Personal history of nicotine dependence: Secondary | ICD-10-CM | POA: Insufficient documentation

## 2017-12-14 DIAGNOSIS — E1122 Type 2 diabetes mellitus with diabetic chronic kidney disease: Secondary | ICD-10-CM | POA: Insufficient documentation

## 2017-12-14 DIAGNOSIS — Z7901 Long term (current) use of anticoagulants: Secondary | ICD-10-CM | POA: Insufficient documentation

## 2017-12-14 DIAGNOSIS — Z85820 Personal history of malignant melanoma of skin: Secondary | ICD-10-CM | POA: Diagnosis not present

## 2017-12-14 DIAGNOSIS — I714 Abdominal aortic aneurysm, without rupture: Secondary | ICD-10-CM | POA: Insufficient documentation

## 2017-12-14 DIAGNOSIS — I6529 Occlusion and stenosis of unspecified carotid artery: Secondary | ICD-10-CM | POA: Diagnosis not present

## 2017-12-14 DIAGNOSIS — I4892 Unspecified atrial flutter: Secondary | ICD-10-CM | POA: Insufficient documentation

## 2017-12-14 DIAGNOSIS — E032 Hypothyroidism due to medicaments and other exogenous substances: Secondary | ICD-10-CM

## 2017-12-14 DIAGNOSIS — K219 Gastro-esophageal reflux disease without esophagitis: Secondary | ICD-10-CM | POA: Insufficient documentation

## 2017-12-14 DIAGNOSIS — J449 Chronic obstructive pulmonary disease, unspecified: Secondary | ICD-10-CM | POA: Diagnosis not present

## 2017-12-14 DIAGNOSIS — I2510 Atherosclerotic heart disease of native coronary artery without angina pectoris: Secondary | ICD-10-CM | POA: Insufficient documentation

## 2017-12-14 DIAGNOSIS — Z8673 Personal history of transient ischemic attack (TIA), and cerebral infarction without residual deficits: Secondary | ICD-10-CM | POA: Diagnosis not present

## 2017-12-14 DIAGNOSIS — I70213 Atherosclerosis of native arteries of extremities with intermittent claudication, bilateral legs: Secondary | ICD-10-CM | POA: Diagnosis not present

## 2017-12-14 DIAGNOSIS — I5022 Chronic systolic (congestive) heart failure: Secondary | ICD-10-CM

## 2017-12-14 DIAGNOSIS — Z79899 Other long term (current) drug therapy: Secondary | ICD-10-CM | POA: Insufficient documentation

## 2017-12-14 DIAGNOSIS — Z79891 Long term (current) use of opiate analgesic: Secondary | ICD-10-CM | POA: Insufficient documentation

## 2017-12-14 DIAGNOSIS — I255 Ischemic cardiomyopathy: Secondary | ICD-10-CM | POA: Insufficient documentation

## 2017-12-14 DIAGNOSIS — I252 Old myocardial infarction: Secondary | ICD-10-CM | POA: Insufficient documentation

## 2017-12-14 DIAGNOSIS — K589 Irritable bowel syndrome without diarrhea: Secondary | ICD-10-CM | POA: Diagnosis not present

## 2017-12-14 MED ORDER — LEVOTHYROXINE SODIUM 25 MCG PO TABS: 25.0000 ug | ORAL_TABLET | Freq: Every day | ORAL | 3 refills | Status: DC

## 2017-12-14 MED ORDER — AMIODARONE HCL 100 MG PO TABS: 100.0000 mg | ORAL_TABLET | Freq: Every day | ORAL | 3 refills | Status: DC

## 2017-12-14 MED ORDER — POTASSIUM CHLORIDE ER 20 MEQ PO TBCR: 20.0000 meq | EXTENDED_RELEASE_TABLET | Freq: Every day | ORAL | 3 refills | Status: DC

## 2017-12-14 MED ORDER — TIOTROPIUM BROMIDE MONOHYDRATE 18 MCG IN CAPS: 18.0000 ug | ORAL_CAPSULE | Freq: Every day | RESPIRATORY_TRACT | 12 refills | Status: DC

## 2017-12-14 NOTE — Progress Notes (Signed)
Patient ID: Andrew [name], male   DOB: [birth year], 83 y.o.   MRN: [id number] PCP: Dr. Raeford Razor Cardiology: Dr. [name]   83 y.o. with history of CKD, CAD s/p CABG, and ischemic cardiomyopathy with primarily diastolic CHF presents for followup of CHF and CAD.  He has a history of PAD followed at [facility].  I took him for [facility] in 11/15. 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  He had had occlusion of a relatively small LAD, there were right to left collaterals and no intervention was done (medical management).    He and his wife went on a cruise along the [employer] in 5/16.  He admits to considerable dietary indiscretion (high sodium diet).  It appears that he developed a hypertensive crisis along with chest pain while on the ship. He went into atrial fibrillation and developed CHF.  He was taken to the hospital in [place], [place].  He was in the ICU for about a week on Bipap intermittently.  Troponin peaked at 0.09 during this admission.  He was diuresed and after about 2 wks left the hospital and was able to fly home.    Cardiolite (6/16) showed EF 42%, prior anterolateral MI, no ischemia. Echo in 10/16 with EF 40-45%.    He was admitted on 07/06/15 for RLQ pain and fever.  He was treated for UTI and had a kidney stone as well.  He developed SOB after IVF were given for AKI.  He was treated for acute COPD exacerbation as well as CHF decompensation.  He was diuresed with IV lasix.  His weight on admission was 179 and got as high as 188 after IVF. Discharge weight was 181.  He was admitted in 10/16 with a cerebellar CVA.  He has some resultant imbalance.   He was again admitted in 11/16 with acute on chronic systolic CHF.  This was a short admission that appeared to be precipitated by a sodium load from eating at a [employer] .   3/18 Echo showed stable EF 40-45%.  He was also having chest pain so I did a Cardiolite in 3/18, no ischemia.   He was admitted in 11/18 for elective hernia  repair. Post-op, he developed hypoxemia and had a rehab stay.  He is now home and off oxygen.   He returns for followup of CHF.  He has stable calf claudication after walking about a block, no lower extremity ulcers.  No chest pain.  He is short of breath walking about 1 block.  He is not short of breath walking in his house or and only mildly short of breath walking up a flight of stairs. Dyspnea is at least mildly worse than pre-op.  No PND.  Has slept in [place] for a long time because of back pain.  Weight up about 4 lbs.    Labs (12/14): K 4.8, creatinine 1.5 Labs (5/15): LDL 92 Labs (11/15): K 4.1, creatinine 1.5 Labs (12/15): K 4, creatinine 1.5 Labs (1/16): LFTs normal, LDL 51, HDL 47 Labs (5/16): TnI 0.09 Labs (6/16): K 4.8 => 4.4, creatinine 1.79 => 2.26, HCT 28.7 Labs (7/16) K 4.2, creatinine 1.98, LFTs normal, TSH normal Labs (8/16): HCT 27.1 Labs (9/16): hgb 8.1, K 4.3, creatinine 1.8, LFTs normal, TSH elevated Labs (10/16): LDL 74, HDL 45, TSH 5.19 (increased), free T3 and free T4 normal.  Labs (11/16): K 3.5, creatinine 1.47, AST 84, ALT 89 Labs (12/16): K 4.3, creatinine 1.58, AST 43, ALT 42  Labs (3/17): K 4.2, creatinine 1.4, hgb 10.7, AST 36, ALT 45 Labs (6/17): K 4, creatinine 1.69, hgb 10.9, proBNP 389, TSH mildly elevated Labs (8/17): Free T4 and T3 normal, TSH mildly elevated, K 4.2, creatinine 1.7, LFTs normal, hgb 10.4 Labs (12/17): TSH elevated but free T3 and T4 normal, K 4.5, creatinine 1.9, LDL 45, HDL 39, LFTs normal, HCT 32.9 Labs (1/18): K 4.4, creatinine 1.93 Labs (3/18): K 4.3, creatinine 1.87, BNP 187, hgb 11.1, LDL 75, LFTs normal Labs (4/18): K 4.1, creatinine 1.7 Labs (9/18): K 4, creatinine 1.8, hgb 11.9 Labs (10/18): free T4 0.4, free T3 1.9, TSH 139  Labs (12/18): K 3.4, creatinine 1.76, hgb 12.2  PMH: 1. CKD 2. HTN 3. Hyperlipidemia 4. AAA: s/p surgery with left renal artery bypass in 1997 - Abdominal US 10/18 with 3.2 AAA 5. GERD 6.  IBS 7. Melanoma s/p reception 8. Diabetes: Diet-controlled 9. Carotid stenosis: Carotid dopplers (4/15) with 40-59% bilateral stenosis. Carotid dopplers (7/16) with < 40% BICA stenosis.  Carotid dopplers (10/16) with 40-59% BICA stenosis.  - Carotids (8/17) with minimal disease.  - Carotids (10/18) with 1-39% BICA stenosis.  10. PAD: 9/14 ABIs 0.91 right 1.09 left.  7/16 ABIs 0.86 right, 1.1 left; aortoiliac duplex with < 50% bilateral iliac stenosis.  - Aortoiliac duplex (8/17) with < 50% bilateral iliac stenosis.   - ABIs (10/17): left 0.91, right 0.98, left TBI 0.44 (abnormal).  - ABIs (7/18): right 0.97, right 0.75 11. CAD: Has super-dominant RCA. s/p CABG 1992.  He had Taxus DES to mid PDA in 5/02. LHC (1/05) with 90% ostial PDA (had DES to this vessel), totally occluded RIMA-PDA, totally occluded PLV, sequential SVG-PLV with 1 branch occluded.  LHC (11/15) with left main off the right cusp, 40-50% ostial left main, total occlusion of the proximal LAD (small vessel) with right to left collaterals, large super-dominant RCA with 50% ISR mid RCA, 50% ostial PDA, patent SVG-PLV, RIMA-PDA known atretic. Lexiscan Cardiolite (6/16) with EF 42%, prior anterolateral MI, no ischemia.  - Lexiscan Cardiolite (3/18): EF 51%, basal to mid anterolateral fixed defect with no ischemia.  12. Ischemic cardiomyopathy: Echo (12/09) with EF 45-50%, basal to mid inferolateral hypokinesis. Echo (6/15) with EF 45-50%, akinesis of the mid to apical inferolateral wall.  Echo (6/16) with EF 50-55%, mild LVH, inferior hypokinesis, mild MR.  Echo (10/16) with EF 40-45%, moderate LVH, mildly decreased RV systolic function.  - Echo (3/18) with EF 40-45%, mid anterior hypokinesis.  13. OA 14. Atrial fibrillation/flutter: Paroxysmal.  Noted during 5/16 hospitalization in [place] and in 6/16.  He developed atrial flutter in 8/16, back in NSR by 9/16.  15. Emphysema: PFTs (8/16) with FVC 88%, FEV1 74%, ratio 81%, TLC 83%, DLCO 31%.   16. Renal cysts 17. Idiopathic peripheral neuropathy.  18. Anemia.  76. 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  20. 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  FH: Brother with CAD.   ROS: All systems reviewed and negative except as per HPI.   Current Outpatient Medications  Medication Sig Dispense Refill  . ACCU-CHEK AVIVA PLUS test strip CHECK BLOOD SUGAR 4 TIMES A DAY. 100 each 11  . acetaminophen (TYLENOL) 500 MG tablet Take 1,000 mg by mouth every 8 (eight) hours as needed for moderate pain.     Marland Kitchen ALPRAZolam (XANAX) 0.5 MG tablet TAKE 1/2 TABLET TWICE DAILY AS NEEDED FOR ANXIETY. (Patient taking differently: Take 0.25 mg by mouth  twice daily as needed for anxiety) 60 tablet 5  . amiodarone (PACERONE) 100 MG tablet Take 1 tablet (100 mg total) by mouth daily. 30 tablet 3  . atorvastatin (LIPITOR) 80 MG tablet TAKE 1 TABLET ONCE DAILY. 90 tablet 3  . carvedilol (COREG) 12.5 MG tablet TAKE 1 TABLET TWICE DAILY. (Patient taking differently: Take 12.5 mg by mouth twice daily) 60 tablet 3  . cilostazol (PLETAL) 100 MG tablet TAKE 1 TABLET TWICE DAILY. (Patient taking differently: Take 100 mg by mouth twice daily) 180 tablet 3  . ELIQUIS 2.5 MG TABS tablet TAKE 1 TABLET TWICE DAILY. (Patient taking differently: Take 2.5 mg by mouth twice daily) 60 tablet 3  . ezetimibe (ZETIA) 10 MG tablet TAKE (1) TABLET DAILY. 90 tablet 3  . FLUoxetine (PROZAC) 20 MG capsule TAKE (1) CAPSULE DAILY. (Patient taking differently: Take 20 mg by mouth daily) 90 capsule 0  . furosemide (LASIX) 80 MG tablet TAKE 1 & 1/2 TABLETS IN THE MORNING AND 1 EACH EVENING AS DIRECTED. (Patient taking differently: Take 120 mg by mouth in the morning and take 80 mg by mouth in the evening) 75 tablet 6  . HYDROcodone-acetaminophen (NORCO/VICODIN) 5-325 MG tablet Take 1 tablet by mouth every 4 (four) hours as needed for moderate pain. 24 tablet 0  . losartan (COZAAR)  100 MG tablet TAKE 1 TABLET ONCE DAILY. (Patient taking differently: Take 100 mg by mouth once daily) 90 tablet 0  . Multiple Vitamin (MULTIVITAMIN) tablet Take 1 tablet by mouth daily.    Marland Kitchen NIFEdipine (PROCARDIA-XL/ADALAT CC) 30 MG 24 hr tablet Take 30 mg by mouth daily.    Marland Kitchen NITROSTAT 0.4 MG SL tablet DISSOLVE 1 TABLET UNDER TONGUE AS NEEDED FOR CHEST PAIN,MAY REPEAT IN5 MINUTES FOR 2 DOSES. (Patient taking differently: DISSOLVE 0.4 MG UNDER TONGUE AS NEEDED FOR CHEST PAIN,MAY REPEAT IN5 MINUTES FOR 2 DOSES.) 25 tablet 6  . temazepam (RESTORIL) 30 MG capsule TAKE 1 CAPSULE AT BEDTIME AS NEEDED FOR SLEEP. (Patient taking differently: Take 30 mg by mouth at bedtime as needed for sleep) 30 capsule 3  . traMADol (ULTRAM) 50 MG tablet Take 1 tablet (50 mg total) by mouth every 6 (six) hours as needed. (Patient taking differently: Take 50 mg by mouth every 6 (six) hours as needed for moderate pain. ) 12 tablet 0  . levothyroxine (SYNTHROID, LEVOTHROID) 25 MCG tablet Take 1 tablet (25 mcg total) by mouth daily before breakfast. 30 tablet 3  . potassium chloride 20 MEQ TBCR Take 20 mEq by mouth daily. 30 tablet 3  . tiotropium (SPIRIVA HANDIHALER) 18 MCG inhalation capsule Place 1 capsule (18 mcg total) into inhaler and inhale daily. 30 capsule 12   No current facility-administered medications for this encounter.    BP 118/71 (BP Location: Left Arm, Patient Position: Sitting, Cuff Size: Normal)   Pulse 68   Wt 202 lb (91.6 kg)   SpO2 92%   BMI 31.64 kg/m   General: NAD Neck: JVP 8 cm with HJR, no thyromegaly or thyroid nodule.  Lungs: Rhonchi bilaterally. CV: Nondisplaced PMI.  Heart regular S1/S2, no S3/S4, no murmur.  1+ ankle edema bilaterally.  No carotid bruit.  Difficult to palpate pedal pulses.  Abdomen: Soft, nontender, no hepatosplenomegaly, no distention.  Skin: Intact without lesions or rashes.  Neurologic: Alert and oriented x 3.  Psych: Normal affect. Extremities: No clubbing or  cyanosis.  HEENT: Normal.   Assessment/Plan:  1. CAD: s/p CABG.  Cath in 11/15 showed occluded  LAD with left to right collaterals.  The LAD was a relatively small vessel (super-dominant right).  Managed medically. Lexiscan Cardiolite in 6/16 with infarction but no ischemia.  Cardiolite 3/18 with infarction, no ischemia. No chest pain.  - Continue ASA 81.   - Continue statin. - Continue Coreg.   2. Ischemic cardiomyopathy with systolic CHF: EF 40-08% on 3/18 echo.  NYHA class III symptoms.  Suspect mild volume overload on exam.  - Continue current losartan 100 mg daily  - Continue Coreg 12.5 mg bid.    - Increase Lasix to 120 mg bid x 4 days then back to 120 qam/80 qpm.  Add KCl 20 daily.  BMET in 10 days.  3. Carotid stenosis: Stable, followed at [facility].  4. PAD: Stable claudication, worsening of ABIs in 7/18.  No pedal ulcers.  Seen by [facility], plan for medical management.  - Continue cilostazol, unable to come off due to worsening claudication when he stops it.  5. Hyperlipidemia: Good lipids in 3/18.  6. CKD III: BMET in 10 days.  7. Atrial fibrillation/flutter:  Paroxysmal, NSR today by exam.  Recent TSH was very high with low free T3 and free T4 suggestive of hypothyroidism, likely related to amiodarone.  - Decrease amiodarone to 100 mg daily.  - I will start him on Synthroid 25 mcg daily, likely will need to titrate up. Will send message to PCP. Will need to repeat TSH in a month.  - Will need to get regular eye exams with amiodarone use. - Continue Eliquis 2.5 bid.  8. Suspected OSA: He cancelled his sleep study due to mask discomfort 9. COPD: He had an oxygen requirement recently when in the hospital, rhonchi on lung exam.  - I will repeat PFTs to see if these have worsened.  Has history of emphysema (also on amiodarone).  - For now, will start on Spiriva daily.  10. CVA: Cerebellar, 10/16.  He has had some resulting imbalance.  He is no longer driving.  11. Hypertension: BP controlled.       Loralie Champagne 12/14/2017

## 2017-12-14 NOTE — Patient Instructions (Signed)
INCREASE Lasix to 120 mg Twice Daily for 4 days only.  Then resume previous dose of 120 mg in the AM and 80 mg in the PM.   START Spiriva inhaler Once Daily  START Synthroid 25 mcg Once Daily before breakfast.  START Potassium 20 mEq (1 Tablet) Once daily  DECREASE amiodarone to 100 mg Once Daily  Labs in 10 days (bmet)  Pulmonary Function Test has been ordered for you, we will schedule at checkout.   Follow up in 1 Month

## 2017-12-20 ENCOUNTER — Ambulatory Visit (HOSPITAL_COMMUNITY)
Admission: RE | Admit: 2017-12-20 | Discharge: 2017-12-20 | Disposition: A | Discharge status: Home / Self Care | Payer: Medicare Other | Source: Ambulatory Visit | Attending: Cardiology | Admitting: Cardiology

## 2017-12-20 DIAGNOSIS — I5022 Chronic systolic (congestive) heart failure: Secondary | ICD-10-CM | POA: Insufficient documentation

## 2017-12-20 LAB — PULMONARY FUNCTION TEST
DL/VA % pred: 57 %
DL/VA: 2.46 ml/min/mmHg/L
DLCO unc % pred: 33 %
DLCO unc: 9.02 ml/min/mmHg
FEF 25-75 Post: 1.13 L/sec
FEF 25-75 Pre: 0.87 L/sec
FEF2575-%CHANGE-POST: 30 %
FEF2575-%PRED-PRE: 69 %
FEF2575-%Pred-Post: 90 %
FEV1-%Change-Post: 2 %
FEV1-%Pred-Post: 71 %
FEV1-%Pred-Pre: 69 %
FEV1-PRE: 1.45 L
FEV1-Post: 1.49 L
FEV1FVC-%CHANGE-POST: -1 %
FEV1FVC-%Pred-Pre: 105 %
FEV6-%CHANGE-POST: 2 %
FEV6-%Pred-Post: 72 %
FEV6-%Pred-Pre: 70 %
FEV6-PRE: 1.96 L
FEV6-Post: 2.01 L
FEV6FVC-%PRED-PRE: 109 %
FEV6FVC-%Pred-Post: 109 %
FVC-%CHANGE-POST: 4 %
FVC-%Pred-Post: 67 %
FVC-%Pred-Pre: 64 %
FVC-Post: 2.05 L
FVC-Pre: 1.96 L
POST FEV1/FVC RATIO: 72 %
Post FEV6/FVC ratio: 100 %
Pre FEV1/FVC ratio: 74 %
Pre FEV6/FVC Ratio: 100 %
RV % pred: 119 %
RV: 3.11 L
TLC % pred: 82 %
TLC: 5.16 L

## 2017-12-20 MED ORDER — ALBUTEROL SULFATE (2.5 MG/3ML) 0.083% IN NEBU
2.5000 mg | INHALATION_SOLUTION | Freq: Once | RESPIRATORY_TRACT | Status: AC
  Administered 2017-12-20: 2.5 mg via RESPIRATORY_TRACT

## 2017-12-22 ENCOUNTER — Ambulatory Visit (INDEPENDENT_AMBULATORY_CARE_PROVIDER_SITE_OTHER): Payer: Medicare Other | Admitting: Family Medicine

## 2017-12-22 ENCOUNTER — Encounter: Payer: Self-pay | Admitting: Family Medicine

## 2017-12-22 VITALS — BP 120/64 | HR 66 | Temp 97.7°F | Ht 67.0 in | Wt 196.8 lb

## 2017-12-22 DIAGNOSIS — F325 Major depressive disorder, single episode, in full remission: Secondary | ICD-10-CM

## 2017-12-22 DIAGNOSIS — E032 Hypothyroidism due to medicaments and other exogenous substances: Secondary | ICD-10-CM

## 2017-12-22 DIAGNOSIS — E1159 Type 2 diabetes mellitus with other circulatory complications: Secondary | ICD-10-CM | POA: Diagnosis not present

## 2017-12-22 DIAGNOSIS — E1169 Type 2 diabetes mellitus with other specified complication: Secondary | ICD-10-CM

## 2017-12-22 DIAGNOSIS — E785 Hyperlipidemia, unspecified: Secondary | ICD-10-CM

## 2017-12-22 DIAGNOSIS — I10 Essential (primary) hypertension: Secondary | ICD-10-CM | POA: Diagnosis not present

## 2017-12-22 DIAGNOSIS — R490 Dysphonia: Secondary | ICD-10-CM | POA: Diagnosis not present

## 2017-12-22 DIAGNOSIS — E1122 Type 2 diabetes mellitus with diabetic chronic kidney disease: Secondary | ICD-10-CM

## 2017-12-22 NOTE — Patient Instructions (Addendum)
Please start flonase or fluticasone for your post-nasal drip.  No other med changes today.  Come back in 1 month.  Take care, Dr Jerline Pain

## 2017-12-22 NOTE — Assessment & Plan Note (Signed)
- 

## 2017-12-22 NOTE — Progress Notes (Signed)
Subjective:  Andrew [name] is a 83 y.o. male who presents today with a chief complaint of voice hoarseness and to establish care.   HPI:  Voice hoarseness, new issue Present for several months.  Stable over that time.  No clear precipitating events.  Does note that he clears his throat frequently and has an occasional cough.  No treatment tried.  No obvious alleviating or aggravating factors.  Hypothyroidism, new issue Incidentally noted while hospitalized 2 months ago.  His cardiologist started him on levothyroxine 25 mcg daily about 8 days ago.  Patient is not sure if he has noticed any symptoms improvement from this.  ROS: Per HPI, otherwise a 10 point review of systems was performed and was negative  PMH:  The following were reviewed and entered/updated in epic: Past Medical History:  Diagnosis Date  . AAA (abdominal aortic aneurysm) ([place])   . Anemia   . Arthritis   . CAD (coronary artery disease)   . Cancer ([place])    Melanoma - Back  . Carotid artery stenosis   . CHF (congestive heart failure) ([place])   . Cyst of kidney, acquired   . Depression   . Diabetes mellitus   . DVT (deep venous thrombosis) ([place])   . Dysrhythmia   . Fatty liver 2008  . GERD (gastroesophageal reflux disease)   . Hyperlipidemia   . Hypertension   . IBS (irritable bowel syndrome)   . LVF (left ventricular failure) ([name])   . Paroxysmal atrial fibrillation (H[facility])   . Pneumonia Jan. 2014  . Shortness of breath dyspnea   . Thyroid disease   . Typical atrial flutter ([place])   . UTI (urinary tract infection) 05/2015   While in [place]   . Vitamin D deficiency    Patient Active Problem List   Diagnosis Date Noted  . Bilateral recurrent inguinal hernias 11/28/2017  . Calf pain 05/25/2016  . Carotid artery stenosis 01/12/2016  . Chronic systolic CHF (congestive heart failure) ([place]) 12/02/2015  . Cerebellar infarct ([place]) 10/16/2015  . 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 ([place])  10/16/2015  . PVD (peripheral vascular disease) ([place])   . Hereditary and idiopathic peripheral neuropathy 10/02/2015  . Peripelvic (lymphatic) cyst   . Pernicious anemia 07/15/2015  . Bilateral renal cysts 07/12/2015  . Lung nodule, 51mm RML CT 07/12/15 07/12/2015  . Constipation 07/12/2015  . Kidney stone on right side 07/12/2015  . Stage 3 chronic kidney disease ([place]) 06/14/2015  . Chronic diastolic CHF (congestive heart failure) ([place]) 05/28/2015  . Paroxysmal atrial fibrillation ([place]) 05/28/2015  . AAA (abdominal aortic aneurysm) without rupture ([place]) 09/30/2014  . Left-sided low back pain without sciatica 07/21/2014  . Cardiomyopathy, ischemic 05/23/2014  . DM (diabetes mellitus), type 2 with renal complications ([place]) 09/98/3382  . CORONARY ATHEROSCLEROSIS NATIVE CORONARY ARTERY 11/09/2010  . Depression, major, single episode, complete remission ([place]) 03/18/2010  . Hypothyroidism 02/03/2009  . Hyperlipidemia associated with type 2 diabetes mellitus ([place]) 06/14/2007  . Hypertension associated with diabetes ([place]) 06/14/2007  . GERD 06/14/2007   Past Surgical History:  Procedure Laterality Date  . ABDOMINAL AORTIC ANEURYSM REPAIR  2002  . CARDIAC CATHETERIZATION    . CORONARY ANGIOPLASTY    . CORONARY ANGIOPLASTY WITH STENT PLACEMENT  2005  . CORONARY ARTERY BYPASS GRAFT  1992  . EYE SURGERY     [place]  . HERNIA REPAIR    . INGUINAL HERNIA REPAIR Bilateral    w/mesh  . INSERTION OF MESH  N/A 11/28/2017   Procedure: INSERTION OF MESH;  Surgeon: Donnie Mesa, MD;  Location: [facility];  Service: General;  Laterality: N/A;  GENERAL AND TAP BLOCK  . KNEE SURGERY Bilateral   . LAPAROSCOPIC INGUINAL HERNIA WITH UMBILICAL HERNIA Bilateral 11/28/2017   Procedure: LAPAROSCOPIC BILATERAL INGUINAL HERNIA REPAIR WITH MESH, UMBILICAL HERNIA REPAIR WITH MESH;  Surgeon: Donnie Mesa, MD;  Location: [facility];  Service: General;  Laterality: Bilateral;  GENERAL AND TAP BLOCK  . LEFT HEART  CATHETERIZATION WITH CORONARY ANGIOGRAM N/A 10/30/2014   Procedure: LEFT HEART CATHETERIZATION WITH CORONARY ANGIOGRAM;  Surgeon: Larey Dresser, MD;  Location: [facility] CATH LAB;  Service: Cardiovascular;  Laterality: N/A;  . QUADRICEPS TENDON REPAIR Bilateral 08/2003   Archie Endo 05/10/2011  . UMBILICAL HERNIA REPAIR  11/28/2017   w/mesh    Family History  Problem Relation Age of Onset  . Colon cancer Father   . Coronary artery disease Brother   . Diabetes Brother   . Heart disease Brother   . Hyperlipidemia Brother   . Peripheral vascular disease Brother        Varicose Veins  . Throat cancer Brother        abdominal cancer?   . Diabetes Son   . Hyperlipidemia Son     Medications- reviewed and updated Current Outpatient Medications  Medication Sig Dispense Refill  . ACCU-CHEK AVIVA PLUS test strip CHECK BLOOD SUGAR 4 TIMES A DAY. 100 each 11  . acetaminophen (TYLENOL) 500 MG tablet Take 1,000 mg by mouth every 8 (eight) hours as needed for moderate pain.     Marland Kitchen ALPRAZolam (XANAX) 0.5 MG tablet TAKE 1/2 TABLET TWICE DAILY AS NEEDED FOR ANXIETY. (Patient taking differently: Take 0.25 mg by mouth twice daily as needed for anxiety) 60 tablet 5  . amiodarone (PACERONE) 100 MG tablet Take 1 tablet (100 mg total) by mouth daily. 30 tablet 3  . atorvastatin (LIPITOR) 80 MG tablet TAKE 1 TABLET ONCE DAILY. 90 tablet 3  . carvedilol (COREG) 12.5 MG tablet TAKE 1 TABLET TWICE DAILY. (Patient taking differently: Take 12.5 mg by mouth twice daily) 60 tablet 3  . cilostazol (PLETAL) 100 MG tablet TAKE 1 TABLET TWICE DAILY. (Patient taking differently: Take 100 mg by mouth twice daily) 180 tablet 3  . ELIQUIS 2.5 MG TABS tablet TAKE 1 TABLET TWICE DAILY. (Patient taking differently: Take 2.5 mg by mouth twice daily) 60 tablet 3  . ezetimibe (ZETIA) 10 MG tablet TAKE (1) TABLET DAILY. 90 tablet 3  . FLUoxetine (PROZAC) 20 MG capsule TAKE (1) CAPSULE DAILY. (Patient taking differently: Take 20 mg by mouth  daily) 90 capsule 0  . furosemide (LASIX) 80 MG tablet TAKE 1 & 1/2 TABLETS IN THE MORNING AND 1 EACH EVENING AS DIRECTED. (Patient taking differently: Take 120 mg by mouth in the morning and take 80 mg by mouth in the evening) 75 tablet 6  . HYDROcodone-acetaminophen (NORCO/VICODIN) 5-325 MG tablet Take 1 tablet by mouth every 4 (four) hours as needed for moderate pain. 24 tablet 0  . levothyroxine (SYNTHROID, LEVOTHROID) 25 MCG tablet Take 1 tablet (25 mcg total) by mouth daily before breakfast. 30 tablet 3  . losartan (COZAAR) 100 MG tablet TAKE 1 TABLET ONCE DAILY. (Patient taking differently: Take 100 mg by mouth once daily) 90 tablet 0  . Multiple Vitamin (MULTIVITAMIN) tablet Take 1 tablet by mouth daily.    Marland Kitchen NIFEdipine (PROCARDIA-XL/ADALAT CC) 30 MG 24 hr tablet Take 30 mg by mouth daily.    Marland Kitchen  NITROSTAT 0.4 MG SL tablet DISSOLVE 1 TABLET UNDER TONGUE AS NEEDED FOR CHEST PAIN,MAY REPEAT IN5 MINUTES FOR 2 DOSES. (Patient taking differently: DISSOLVE 0.4 MG UNDER TONGUE AS NEEDED FOR CHEST PAIN,MAY REPEAT IN5 MINUTES FOR 2 DOSES.) 25 tablet 6  . potassium chloride 20 MEQ TBCR Take 20 mEq by mouth daily. 30 tablet 3  . temazepam (RESTORIL) 30 MG capsule TAKE 1 CAPSULE AT BEDTIME AS NEEDED FOR SLEEP. (Patient taking differently: Take 30 mg by mouth at bedtime as needed for sleep) 30 capsule 3  . tiotropium (SPIRIVA HANDIHALER) 18 MCG inhalation capsule Place 1 capsule (18 mcg total) into inhaler and inhale daily. 30 capsule 12  . traMADol (ULTRAM) 50 MG tablet Take 1 tablet (50 mg total) by mouth every 6 (six) hours as needed. (Patient taking differently: Take 50 mg by mouth every 6 (six) hours as needed for moderate pain. ) 12 tablet 0   No current facility-administered medications for this visit.     Allergies-reviewed and updated Allergies  Allergen Reactions  . Metoprolol Shortness Of Breath, Palpitations and Other (See Comments)    Heart starts racing. Shallow breathing   . Metformin  And Related Nausea And Vomiting    Social History   Socioeconomic History  . Marital status: Married    Spouse name: None  . Number of children: None  . Years of education: None  . Highest education level: None  Social Needs  . Financial resource strain: None  . Food insecurity - worry: None  . Food insecurity - inability: None  . Transportation needs - medical: None  . Transportation needs - non-medical: None  Occupational History  . Occupation: Retired  Tobacco Use  . Smoking status: Former Smoker    Packs/day: 1.50    Years: 35.00    Pack years: 52.50    Types: Cigarettes  . Smokeless tobacco: Never Used  . Tobacco comment: quit atleast 25 yrs ago, per pt  Substance and Sexual Activity  . Alcohol use: Yes    Alcohol/week: 1.8 oz    Types: 3 Shots of liquor per week    Comment: socially  . Drug use: No  . Sexual activity: Yes  Other Topics Concern  . None  Social History Narrative   Pt lives in [place] with spouse.   Retired from [employer].  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      Objective:  Physical Exam: BP 120/64 (BP Location: Left Arm, Patient Position: Sitting, Cuff Size: Normal)   Pulse 66   Temp 97.7 F (36.5 C) (Oral)   Ht 5\' 7"  (1.702 m)   Wt 196 lb 12.8 oz (89.3 kg)   SpO2 94%   BMI 30.82 kg/m   Gen: NAD, resting comfortably HEENT: Right TM clear.  Left TM obscured by hearing aid.  Nasal mucosa erythematous with clear nasal discharge.  Oropharynx erythematous without exudate. CV: RRR with no murmurs appreciated Pulm: NWOB, CTAB with no crackles, wheezes, or rhonchi GI: Normal bowel sounds present. Soft, Nontender, Nondistended. MSK: No edema, cyanosis, or clubbing noted Skin: Warm, dry Neuro: Grossly normal, moves all extremities Psych: Normal affect and thought content  Assessment/Plan:  Hypertension associated with diabetes ([place]) At goal.  Continue Coreg, losartan, Procardia.   Would aim for 120/80 or lower as tolerated given his history of AAA.  DM (diabetes mellitus), type 2 with renal complications Last J8S 6.6 earlier this month.  Currently diet controlled without any medications.  Plan  on recheck in about 6 months.  Depression, major, single episode, complete remission (HCC) Stable.  Continue Prozac.  Hypothyroidism Last TSH significantly elevated into the 120s.  He was started on levothyroxine 25 mcg daily about 8 days ago.  He is tolerating this dose well.  We will plan on recheck thyroid function labs in about 4 weeks.  Hyperlipidemia associated with type 2 diabetes mellitus (HCC) Last LDL of 75.  Continue atorvastatin and Zetia.  Hoarseness Likely secondary to postnasal drip.  Start intranasal fluticasone.  Follow-up in 1 month.  If no improvement, would consider referral to ENT.  Algis Greenhouse. Jerline Pain, MD 12/22/2017 2:18 PM

## 2017-12-22 NOTE — Assessment & Plan Note (Signed)
Last TSH significantly elevated into the 120s.  He was started on levothyroxine 25 mcg daily about 8 days ago.  He is tolerating this dose well.  We will plan on recheck thyroid function labs in about 4 weeks.

## 2017-12-22 NOTE — Assessment & Plan Note (Signed)
Last A1c 6.6 earlier this month.  Currently diet controlled without any medications.  Plan on recheck in about 6 months.

## 2017-12-22 NOTE — Assessment & Plan Note (Signed)
At goal.  Continue Coreg, losartan, Procardia.  Would aim for 120/80 or lower as tolerated given his history of AAA.

## 2017-12-22 NOTE — Assessment & Plan Note (Signed)
Last LDL of 75.  Continue atorvastatin and Zetia.

## 2017-12-22 NOTE — Telephone Encounter (Signed)
Patient has an appointment scheduled with Dr. Dimas Chyle to establish care.

## 2017-12-25 ENCOUNTER — Telehealth (HOSPITAL_COMMUNITY): Payer: Self-pay

## 2017-12-25 DIAGNOSIS — R942 Abnormal results of pulmonary function studies: Secondary | ICD-10-CM

## 2017-12-25 NOTE — Telephone Encounter (Signed)
Notes recorded by Shirley Muscat, RN on 12/25/2017 at 9:03 AM EST Pt aware of results and referral placed   ------  Notes recorded by Larey Dresser, MD on 12/20/2017 at 10:51 PM EST Mild obstructive defect, severe diffusion defect. 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

## 2017-12-27 ENCOUNTER — Ambulatory Visit (HOSPITAL_COMMUNITY)
Admission: RE | Admit: 2017-12-27 | Discharge: 2017-12-27 | Disposition: A | Discharge status: Home / Self Care | Payer: Medicare Other | Source: Ambulatory Visit | Attending: Cardiology | Admitting: Cardiology

## 2017-12-27 ENCOUNTER — Telehealth: Payer: Self-pay | Admitting: Family Medicine

## 2017-12-27 ENCOUNTER — Other Ambulatory Visit (HOSPITAL_COMMUNITY): Payer: Self-pay | Admitting: Cardiology

## 2017-12-27 ENCOUNTER — Other Ambulatory Visit: Payer: Self-pay | Admitting: Internal Medicine

## 2017-12-27 ENCOUNTER — Encounter: Payer: Medicare Other | Admitting: *Deleted

## 2017-12-27 VITALS — BP 124/72 | HR 75 | Wt 194.0 lb

## 2017-12-27 DIAGNOSIS — I5022 Chronic systolic (congestive) heart failure: Secondary | ICD-10-CM | POA: Diagnosis not present

## 2017-12-27 DIAGNOSIS — Z006 Encounter for examination for normal comparison and control in clinical research program: Secondary | ICD-10-CM

## 2017-12-27 LAB — BASIC METABOLIC PANEL
Anion gap: 11 (ref 5–15)
BUN: 60 mg/dL — AB (ref 6–20)
CO2: 28 mmol/L (ref 22–32)
Calcium: 9.2 mg/dL (ref 8.9–10.3)
Chloride: 97 mmol/L — ABNORMAL LOW (ref 101–111)
Creatinine, Ser: 2.7 mg/dL — ABNORMAL HIGH (ref 0.61–1.24)
GFR calc Af Amer: 23 mL/min — ABNORMAL LOW (ref >60)
GFR, EST NON AFRICAN AMERICAN: 20 mL/min — AB (ref >60)
GLUCOSE: 138 mg/dL — AB (ref 65–99)
POTASSIUM: 4 mmol/L (ref 3.5–5.1)
SODIUM: 136 mmol/L (ref 135–145)

## 2017-12-27 NOTE — Telephone Encounter (Signed)
Copied from [place] [id number]. Topic: Inquiry >> Dec 27, 2017  4:54 PM Neva Seat wrote: [facility] [phone]  Requesting skilled nursing visits for disease management and med teaching.  Please call Margaretha Sheffield for verbal approval.

## 2017-12-27 NOTE — Progress Notes (Signed)
RESEARCH ENCOUNTER  Patient ID: Andrew [name]  DOB: February 15, 1928  Andrew [name] presented to the [facility] for Visit 10/2-year visit of the [employer].  No signs/symptoms of ACS since the last visit.  IP returned, additional IP dispensed, labs collected and surveys completed.  Discussed medication compliance with subject.    Subject stated that he is feeling much better since the hernia repair surgery.  Currently, he is not having any abdominal pain and his physical mobility is improving.  He stated that he has had intermittent voice hoarseness for the past 6 months and he saw his PCP for this issue.  Subject states that he is using his inhaler and the hoarseness is improving.   Patient will follow [facility] with [facility] April.

## 2017-12-27 NOTE — Telephone Encounter (Signed)
Please call to advise.

## 2017-12-28 NOTE — Telephone Encounter (Signed)
Ok with me. Please place any necessary orders. 

## 2017-12-29 ENCOUNTER — Telehealth: Payer: Self-pay | Admitting: Family Medicine

## 2017-12-29 NOTE — Telephone Encounter (Signed)
Called and gave verbal orders via voicemail with callback number for any questions.

## 2017-12-29 NOTE — Telephone Encounter (Signed)
Called and left message giving verbal orders for additional PT visits.

## 2017-12-29 NOTE — Telephone Encounter (Signed)
Please see note below and contact to advise.    Copied from [place]. Topic: General - Other >> Dec 29, 2017  1:09 PM Cecelia Byars, NT wrote: Patient being treated in by P.T would benefit from additional visits recomend   next week 2 times for 1 week   following 1 time a week 3 weeks  please call  [phone]

## 2018-01-04 ENCOUNTER — Encounter: Payer: Self-pay | Admitting: Family

## 2018-01-04 ENCOUNTER — Ambulatory Visit (HOSPITAL_COMMUNITY)
Admission: RE | Admit: 2018-01-04 | Discharge: 2018-01-04 | Disposition: A | Discharge status: Home / Self Care | Payer: Medicare Other | Source: Ambulatory Visit | Attending: Family | Admitting: Family

## 2018-01-04 ENCOUNTER — Ambulatory Visit (INDEPENDENT_AMBULATORY_CARE_PROVIDER_SITE_OTHER)
Admission: RE | Admit: 2018-01-04 | Discharge: 2018-01-04 | Disposition: A | Discharge status: Home / Self Care | Payer: Medicare Other | Source: Ambulatory Visit | Attending: Family | Admitting: Family

## 2018-01-04 ENCOUNTER — Ambulatory Visit (INDEPENDENT_AMBULATORY_CARE_PROVIDER_SITE_OTHER): Payer: Medicare Other | Admitting: Family

## 2018-01-04 VITALS — BP 134/79 | HR 78 | Resp 20 | Ht 67.0 in | Wt 197.0 lb

## 2018-01-04 DIAGNOSIS — Z8679 Personal history of other diseases of the circulatory system: Secondary | ICD-10-CM | POA: Diagnosis not present

## 2018-01-04 DIAGNOSIS — I6523 Occlusion and stenosis of bilateral carotid arteries: Secondary | ICD-10-CM | POA: Insufficient documentation

## 2018-01-04 DIAGNOSIS — E1151 Type 2 diabetes mellitus with diabetic peripheral angiopathy without gangrene: Secondary | ICD-10-CM | POA: Diagnosis not present

## 2018-01-04 DIAGNOSIS — N184 Chronic kidney disease, stage 4 (severe): Secondary | ICD-10-CM

## 2018-01-04 DIAGNOSIS — Z87891 Personal history of nicotine dependence: Secondary | ICD-10-CM | POA: Diagnosis not present

## 2018-01-04 DIAGNOSIS — Z9889 Other specified postprocedural states: Secondary | ICD-10-CM

## 2018-01-04 DIAGNOSIS — I779 Disorder of arteries and arterioles, unspecified: Secondary | ICD-10-CM

## 2018-01-04 DIAGNOSIS — R0989 Other specified symptoms and signs involving the circulatory and respiratory systems: Secondary | ICD-10-CM | POA: Diagnosis not present

## 2018-01-04 DIAGNOSIS — I872 Venous insufficiency (chronic) (peripheral): Secondary | ICD-10-CM

## 2018-01-04 LAB — VAS US CAROTID
LCCAPDIAS: 11 cm/s
LEFT ECA DIAS: -16 cm/s
LICADDIAS: -11 cm/s
LICADSYS: -32 cm/s
LICAPDIAS: 30 cm/s
LICAPSYS: 90 cm/s
Left CCA dist dias: 10 cm/s
Left CCA dist sys: 35 cm/s
Left CCA prox sys: 60 cm/s
RIGHT CCA MID DIAS: 15 cm/s
RIGHT ECA DIAS: -14 cm/s
Right CCA prox dias: -17 cm/s
Right CCA prox sys: -80 cm/s
Right cca dist sys: -34 cm/s

## 2018-01-04 NOTE — Progress Notes (Signed)
[facility] [place] HISTORY AND PHYSICAL   MRN : [id number]  History of Present Illness:   Andrew [name] is a 84 y.o. male  patient of Dr. Kellie Simmering who is status post AAA repair and left renal artery bypass graft in 1997 by Dr. Amedeo Plenty.  He also has PAD and carotid artery stenosis. He returns for follow up.  He feels that claudication sx's are improving, his legs feel better with walking.   He denies any known lumbar spine problems.  He does have known arthritis. He denies rest pain, denies non-healing wounds or ulcers.   He had an exacerbation of CHF in June 2016 while in [place], then had severe UTI early July 2016; since then he has had dehydration, anemia, weakness. He feels well now.   He denies anyhistory of TIA or stroke symptoms, specifically he denies a history ofamaurosis fugax or monocular blindness, unilateralfacial drooping, hemiplegia, orreceptive or expressive aphasia.   He denies any new back or abdominal pain.  He remains physically active. 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   He sees a podiatrist on a regular basis.   Pt Diabetic: No, is now diet controlled, last A1C result on file was 6.6 on 11-27-17.  His serum creatinine on 12-27-17 was 2.7, eGFR was 20 (stage 4 CKD).  Pt smoker: former smoker, quit at age 83  Pt meds include:  Statin :Yes Betablocker: No ASA: no Other anticoagulants/antiplatelets: Eliquis started early June 2016, prescribed by his cardiologist (per pt). It appears that he had transient atrial fib.   Current Outpatient Medications  Medication Sig Dispense Refill  . ACCU-CHEK AVIVA PLUS test strip CHECK BLOOD SUGAR 4 TIMES A DAY. 100 each 11  . acetaminophen (TYLENOL) 500 MG tablet Take 1,000 mg by mouth every 8 (eight) hours as needed for moderate pain.     Marland Kitchen ALPRAZolam (XANAX) 0.5 MG tablet TAKE 1/2 TABLET TWICE DAILY AS NEEDED FOR ANXIETY. (Patient taking differently: Take 0.25 mg by mouth twice daily  as needed for anxiety) 60 tablet 5  . amiodarone (PACERONE) 100 MG tablet Take 1 tablet (100 mg total) by mouth daily. 30 tablet 3  . atorvastatin (LIPITOR) 80 MG tablet TAKE 1 TABLET ONCE DAILY. 90 tablet 3  . carvedilol (COREG) 12.5 MG tablet TAKE 1 TABLET TWICE DAILY. (Patient taking differently: Take 12.5 mg by mouth twice daily) 60 tablet 3  . cilostazol (PLETAL) 100 MG tablet TAKE 1 TABLET TWICE DAILY. (Patient taking differently: Take 100 mg by mouth twice daily) 180 tablet 3  . ELIQUIS 2.5 MG TABS tablet TAKE 1 TABLET TWICE DAILY. (Patient taking differently: Take 2.5 mg by mouth twice daily) 60 tablet 3  . ezetimibe (ZETIA) 10 MG tablet TAKE (1) TABLET DAILY. 90 tablet 3  . FLUoxetine (PROZAC) 20 MG capsule TAKE (1) CAPSULE DAILY. (Patient taking differently: Take 20 mg by mouth daily) 90 capsule 0  . furosemide (LASIX) 80 MG tablet TAKE 1 & 1/2 TABLETS IN THE MORNING AND 1 EACH EVENING AS DIRECTED. (Patient taking differently: Take 120 mg by mouth in the morning and take 80 mg by mouth in the evening) 75 tablet 6  . HYDROcodone-acetaminophen (NORCO/VICODIN) 5-325 MG tablet Take 1 tablet by mouth every 4 (four) hours as needed for moderate pain. 24 tablet 0  . levothyroxine (SYNTHROID, LEVOTHROID) 25 MCG tablet Take 1 tablet (25 mcg total) by mouth daily before breakfast. 30 tablet 3  . losartan (COZAAR) 100 MG tablet TAKE 1 TABLET ONCE DAILY. (Patient  taking differently: Take 100 mg by mouth once daily) 90 tablet 0  . Multiple Vitamin (MULTIVITAMIN) tablet Take 1 tablet by mouth daily.    Marland Kitchen NIFEdipine (PROCARDIA-XL/ADALAT CC) 30 MG 24 hr tablet Take 30 mg by mouth daily.    Marland Kitchen NIFEdipine (PROCARDIA-XL/ADALAT CC) 30 MG 24 hr tablet TAKE 1 TABLET ONCE DAILY. 90 tablet 0  . NITROSTAT 0.4 MG SL tablet DISSOLVE 1 TABLET UNDER TONGUE AS NEEDED FOR CHEST PAIN,MAY REPEAT IN5 MINUTES FOR 2 DOSES. (Patient taking differently: DISSOLVE 0.4 MG UNDER TONGUE AS NEEDED FOR CHEST PAIN,MAY REPEAT IN5 MINUTES  FOR 2 DOSES.) 25 tablet 6  . potassium chloride 20 MEQ TBCR Take 20 mEq by mouth daily. 30 tablet 3  . temazepam (RESTORIL) 30 MG capsule TAKE 1 CAPSULE AT BEDTIME AS NEEDED FOR SLEEP. (Patient taking differently: Take 30 mg by mouth at bedtime as needed for sleep) 30 capsule 3  . tiotropium (SPIRIVA HANDIHALER) 18 MCG inhalation capsule Place 1 capsule (18 mcg total) into inhaler and inhale daily. 30 capsule 12  . traMADol (ULTRAM) 50 MG tablet Take 1 tablet (50 mg total) by mouth every 6 (six) hours as needed. (Patient taking differently: Take 50 mg by mouth every 6 (six) hours as needed for moderate pain. ) 12 tablet 0   No current facility-administered medications for this visit.     Past Medical History:  Diagnosis Date  . AAA (abdominal aortic aneurysm) ([facility])   . Anemia   . Arthritis   . CAD (coronary artery disease)   . Cancer ([facility])    Melanoma - Back  . Carotid artery stenosis   . CHF (congestive heart failure) ([facility])   . Cyst of kidney, acquired   . Depression   . Diabetes mellitus   . DVT (deep venous thrombosis) ([facility])   . Dysrhythmia   . Fatty liver 2008  . GERD (gastroesophageal reflux disease)   . Hyperlipidemia   . Hypertension   . IBS (irritable bowel syndrome)   . LVF (left ventricular failure) ([facility])   . Paroxysmal atrial fibrillation ([facility])   . Pneumonia Jan. 2014  . Shortness of breath dyspnea   . Thyroid disease   . Typical atrial flutter ([facility])   . UTI (urinary tract infection) 05/2015   While in [place]   . Vitamin D deficiency     Social History Social History   Tobacco Use  . Smoking status: Former Smoker    Packs/day: 1.50    Years: 35.00    Pack years: 52.50    Types: Cigarettes  . Smokeless tobacco: Never Used  . Tobacco comment: quit atleast 25 yrs ago, per pt  Substance Use Topics  . Alcohol use: Yes    Alcohol/week: 1.8 oz    Types: 3 Shots of liquor per week    Comment: socially  . Drug use: No    Family History Family History   Problem Relation Age of Onset  . Colon cancer Father   . Coronary artery disease Brother   . Diabetes Brother   . Heart disease Brother   . Hyperlipidemia Brother   . Peripheral vascular disease Brother        Varicose Veins  . Throat cancer Brother        abdominal cancer?   . Diabetes Son   . Hyperlipidemia Son     Surgical History Past Surgical History:  Procedure Laterality Date  . ABDOMINAL AORTIC ANEURYSM REPAIR  2002  . CARDIAC CATHETERIZATION    .  CORONARY ANGIOPLASTY    . CORONARY ANGIOPLASTY WITH STENT PLACEMENT  2005  . CORONARY ARTERY BYPASS GRAFT  1992  . EYE SURGERY     [place]  . HERNIA REPAIR    . INGUINAL HERNIA REPAIR Bilateral    w/mesh  . INSERTION OF MESH N/A 11/28/2017   Procedure: INSERTION OF MESH;  Surgeon: Donnie Mesa, MD;  Location: [facility];  Service: General;  Laterality: N/A;  GENERAL AND TAP BLOCK  . KNEE SURGERY Bilateral   . LAPAROSCOPIC INGUINAL HERNIA WITH UMBILICAL HERNIA Bilateral 11/28/2017   Procedure: LAPAROSCOPIC BILATERAL INGUINAL HERNIA REPAIR WITH MESH, UMBILICAL HERNIA REPAIR WITH MESH;  Surgeon: Donnie Mesa, MD;  Location: [facility];  Service: General;  Laterality: Bilateral;  GENERAL AND TAP BLOCK  . LEFT HEART CATHETERIZATION WITH CORONARY ANGIOGRAM N/A 10/30/2014   Procedure: LEFT HEART CATHETERIZATION WITH CORONARY ANGIOGRAM;  Surgeon: Larey Dresser, MD;  Location: [facility] CATH LAB;  Service: Cardiovascular;  Laterality: N/A;  . QUADRICEPS TENDON REPAIR Bilateral 08/2003   Archie Endo 05/10/2011  . UMBILICAL HERNIA REPAIR  11/28/2017   w/mesh    Allergies  Allergen Reactions  . Metoprolol Shortness Of Breath, Palpitations and Other (See Comments)    Heart starts racing. Shallow breathing   . Metformin And Related Nausea And Vomiting    Current Outpatient Medications  Medication Sig Dispense Refill  . ACCU-CHEK AVIVA PLUS test strip CHECK BLOOD SUGAR 4 TIMES A DAY. 100 each 11  . acetaminophen (TYLENOL) 500 MG tablet Take 1,000  mg by mouth every 8 (eight) hours as needed for moderate pain.     Marland Kitchen ALPRAZolam (XANAX) 0.5 MG tablet TAKE 1/2 TABLET TWICE DAILY AS NEEDED FOR ANXIETY. (Patient taking differently: Take 0.25 mg by mouth twice daily as needed for anxiety) 60 tablet 5  . amiodarone (PACERONE) 100 MG tablet Take 1 tablet (100 mg total) by mouth daily. 30 tablet 3  . atorvastatin (LIPITOR) 80 MG tablet TAKE 1 TABLET ONCE DAILY. 90 tablet 3  . carvedilol (COREG) 12.5 MG tablet TAKE 1 TABLET TWICE DAILY. (Patient taking differently: Take 12.5 mg by mouth twice daily) 60 tablet 3  . cilostazol (PLETAL) 100 MG tablet TAKE 1 TABLET TWICE DAILY. (Patient taking differently: Take 100 mg by mouth twice daily) 180 tablet 3  . ELIQUIS 2.5 MG TABS tablet TAKE 1 TABLET TWICE DAILY. (Patient taking differently: Take 2.5 mg by mouth twice daily) 60 tablet 3  . ezetimibe (ZETIA) 10 MG tablet TAKE (1) TABLET DAILY. 90 tablet 3  . FLUoxetine (PROZAC) 20 MG capsule TAKE (1) CAPSULE DAILY. (Patient taking differently: Take 20 mg by mouth daily) 90 capsule 0  . furosemide (LASIX) 80 MG tablet TAKE 1 & 1/2 TABLETS IN THE MORNING AND 1 EACH EVENING AS DIRECTED. (Patient taking differently: Take 120 mg by mouth in the morning and take 80 mg by mouth in the evening) 75 tablet 6  . HYDROcodone-acetaminophen (NORCO/VICODIN) 5-325 MG tablet Take 1 tablet by mouth every 4 (four) hours as needed for moderate pain. 24 tablet 0  . levothyroxine (SYNTHROID, LEVOTHROID) 25 MCG tablet Take 1 tablet (25 mcg total) by mouth daily before breakfast. 30 tablet 3  . losartan (COZAAR) 100 MG tablet TAKE 1 TABLET ONCE DAILY. (Patient taking differently: Take 100 mg by mouth once daily) 90 tablet 0  . Multiple Vitamin (MULTIVITAMIN) tablet Take 1 tablet by mouth daily.    Marland Kitchen NIFEdipine (PROCARDIA-XL/ADALAT CC) 30 MG 24 hr tablet Take 30 mg by mouth daily.    Marland Kitchen  NIFEdipine (PROCARDIA-XL/ADALAT CC) 30 MG 24 hr tablet TAKE 1 TABLET ONCE DAILY. 90 tablet 0  .  NITROSTAT 0.4 MG SL tablet DISSOLVE 1 TABLET UNDER TONGUE AS NEEDED FOR CHEST PAIN,MAY REPEAT IN5 MINUTES FOR 2 DOSES. (Patient taking differently: DISSOLVE 0.4 MG UNDER TONGUE AS NEEDED FOR CHEST PAIN,MAY REPEAT IN5 MINUTES FOR 2 DOSES.) 25 tablet 6  . potassium chloride 20 MEQ TBCR Take 20 mEq by mouth daily. 30 tablet 3  . temazepam (RESTORIL) 30 MG capsule TAKE 1 CAPSULE AT BEDTIME AS NEEDED FOR SLEEP. (Patient taking differently: Take 30 mg by mouth at bedtime as needed for sleep) 30 capsule 3  . tiotropium (SPIRIVA HANDIHALER) 18 MCG inhalation capsule Place 1 capsule (18 mcg total) into inhaler and inhale daily. 30 capsule 12  . traMADol (ULTRAM) 50 MG tablet Take 1 tablet (50 mg total) by mouth every 6 (six) hours as needed. (Patient taking differently: Take 50 mg by mouth every 6 (six) hours as needed for moderate pain. ) 12 tablet 0   No current facility-administered medications for this visit.      REVIEW OF SYSTEMS: See HPI for pertinent positives and negatives.  Physical Examination Vitals:   01/04/18 1109  BP: 132/70  Pulse: 78  Resp: 20  SpO2: 95%  Weight: 197 lb (89.4 kg)  Height: 5' 7"  (1.702 m)   Body mass index is 30.85 kg/m.  General:  WDWN male in NAD  Gait: normal HENT: WNL  Eyes: PERRLA Pulmonary: normal non-labored breathing, CTAB, fair air moement Cardiac: RRR, no detected murmur.   Abdomen: soft, NT, moderate sized asymptomatic reducible ventral hernia with use of abdominal muscles.  VASCULAR EXAM Carotid Bruits Left Right   Negative  Negative    Radial pulses are 2+ palpable bilaterally Abdominal aortic pulse is not palpable  VASCULAR EXAM: Extremitieswithout ischemic changes  without Gangrene; without open wounds. Both lower legs with venous stasis dermatitis changes, with trace pitting edema.   LE Pulses  LEFT  RIGHT   FEMORAL  palpable palpable   POPLITEAL  not palpable  not palpable  POSTERIOR TIBIAL   not palpable  not palpable   DORSALIS PEDIS ANTERIOR TIBIAL  Not palpable  1+ palpable    Musculoskeletal: mild generalized muscle wasting and atrophy. Skin: No rash, no cellulitis, no ulcers noted. Thick callus right heel.  Neurologic:A&O X 3; appropriate affect; MOTOR FUNCTION: 4/5 throughout, CN 2-12 intact except is somewhat hard of hearing. Speech is fluent/normal     ASSESSMENT:  Andrew [name] is a 84 y.o. male  who is status post AAA repair and left renal artery bypass graft in 1997. He also has PAD and carotid artery stenosis. His legs feel better with walking, no signs of ischemia in his feet or legs. He feels better in general.  He has no history of stroke or TIA.  I discussed with Dr. Oneida Alar at a previous visit pt worsening calf claudication, and also his serum creatinine last month of 2.37, eGFR was 23 (stage 4 CKD).  The next diagnostic and possible treatment step would be an arteriogram; the contrast used with this has the potential of causing the loss of his remaining renal function.   Discussed with Mr. Mixson that we advise conservative measures to address his PAOD: daily seated leg exercises, graduated walking program, and maximal medical management. He is in agreement with this.    DATA  Carotid Duplex (01/04/18): 1-39% bilateral ICA stenosis. Bilateral vertebral artery flow is antegrade.  Bilateral  subclavian artery waveforms are normal.  No significant change compared to exam on 08-25-16.  ABI (Date: 01/04/2018):  R:   ABI: 1.04 (was 0.75 on 06-29-17),   PT: mono  DP: bi  TBI:  0.80  L:   ABI: 1.03 (was 0.97),   PT: mono  DP: bi  TBI: 0.65  Significant improvement in the right ABI (now normal), stable and normal in the left ABI; bi and monophasic waveforms bilaterally.  Bilateral Aortoiliac Duplex (08-25-16): < 50% bilateral iliac artery stenosis and a patent left renal artery bypass. No significant change compared to exam of  07/17/15.   PLAN:   Daily seated leg exercises as discussed and demonstrated.  Based on today's exam and non-invasive vascular lab results, the patient will follow up in 6 monthswith the following tests: ABI's, and carotid duplex in a year.  I advised him to notify us if he develops worsening symptoms in his legs or feet.   I discussed in depth with the patient the nature of atherosclerosis, and emphasized the importance of maximal medical management including strict control of blood pressure, blood glucose, and lipid levels, obtaining regular exercise, and cessation of smoking.  The patient is aware that without maximal medical management the underlying atherosclerotic disease process will progress, limiting the benefit of any interventions.  The patient was given information about stroke prevention and what symptoms should prompt the patient to seek immediate medical care.  The patient was given information about PAD including signs, symptoms, treatment, what symptoms should prompt the patient to seek immediate medical care, and risk reduction measures to take.  Thank you for allowing [place] to participate in this patient's care.  Clemon Chambers, RN, MSN, FNP-C [facility] Office: [phone]  Clinic MD: [facility] 01/04/2018 11:11 AM

## 2018-01-04 NOTE — Patient Instructions (Addendum)
Peripheral Vascular Disease Peripheral vascular disease (PVD) is a disease of the blood vessels that are not part of your heart and brain. A simple term for PVD is poor circulation. In most cases, PVD narrows the blood vessels that carry blood from your heart to the rest of your body. This can result in a decreased supply of blood to your arms, legs, and internal organs, like your stomach or kidneys. However, it most often affects a person's lower legs and feet. There are two types of PVD.  Organic PVD. This is the more common type. It is caused by damage to the structure of blood vessels.  Functional PVD. This is caused by conditions that make blood vessels contract and tighten (spasm).  Without treatment, PVD tends to get worse over time. PVD can also lead to acute ischemic limb. This is when an arm or limb suddenly has trouble getting enough blood. This is a medical emergency. Follow these instructions at home:  Take medicines only as told by your doctor.  Do not use any tobacco products, including cigarettes, chewing tobacco, or electronic cigarettes. If you need help quitting, ask your doctor.  Lose weight if you are overweight, and maintain a healthy weight as told by your doctor.  Eat a diet that is low in fat and cholesterol. If you need help, ask your doctor.  Exercise regularly. Ask your doctor for some good activities for you.  Take good care of your feet. ? Wear comfortable shoes that fit well. ? Check your feet often for any cuts or sores. Contact a doctor if:  You have cramps in your legs while walking.  You have leg pain when you are at rest.  You have coldness in a leg or foot.  Your skin changes.  You are unable to get or have an erection (erectile dysfunction).  You have cuts or sores on your feet that are not healing. Get help right away if:  Your arm or leg turns cold and blue.  Your arms or legs become red, warm, swollen, painful, or numb.  You have  chest pain or trouble breathing.  You suddenly have weakness in your face, arm, or leg.  You become very confused or you cannot speak.  You suddenly have a very bad headache.  You suddenly cannot see. This information is not intended to replace advice given to you by your health care provider. Make sure you discuss any questions you have with your health care provider. Document Released: 03/08/2010 Document Revised: 05/19/2016 Document Reviewed: 05/22/2014 Elsevier Interactive Patient Education  2017 Elsevier Inc.       Stroke Prevention Some health problems and behaviors may make it more likely for you to have a stroke. Below are ways to lessen your risk of having a stroke.  Be active for at least 30 minutes on most or all days.  Do not smoke. Try not to be around others who smoke.  Do not drink too much alcohol. ? Do not have more than 2 drinks a day if you are a man. ? Do not have more than 1 drink a day if you are a woman and are not pregnant.  Eat healthy foods, such as fruits and vegetables. If you were put on a specific diet, follow the diet as told.  Keep your cholesterol levels under control through diet and medicines. Look for foods that are low in saturated fat, trans fat, cholesterol, and are high in fiber.  If you have diabetes, follow   all diet plans and take your medicine as told.  Ask your doctor if you need treatment to lower your blood pressure. If you have high blood pressure (hypertension), follow all diet plans and take your medicine as told by your doctor.  If you are 18-39 years old, have your blood pressure checked every 3-5 years. If you are age 40 or older, have your blood pressure checked every year.  Keep a healthy weight. Eat foods that are low in calories, salt, saturated fat, trans fat, and cholesterol.  Do not take drugs.  Avoid birth control pills, if this applies. Talk to your doctor about the risks of taking birth control pills.  Talk to  your doctor if you have sleep problems (sleep apnea).  Take all medicine as told by your doctor. ? You may be told to take aspirin or blood thinner medicine. Take this medicine as told by your doctor. ? Understand your medicine instructions.  Make sure any other conditions you have are being taken care of.  Get help right away if:  You suddenly lose feeling (you feel numb) or have weakness in your face, arm, or leg.  Your face or eyelid hangs down to one side.  You suddenly feel confused.  You have trouble talking (aphasia) or understanding what people are saying.  You suddenly have trouble seeing in one or both eyes.  You suddenly have trouble walking.  You are dizzy.  You lose your balance or your movements are clumsy (uncoordinated).  You suddenly have a very bad headache and you do not know the cause.  You have new chest pain.  Your heart feels like it is fluttering or skipping a beat (irregular heartbeat). Do not wait to see if the symptoms above go away. Get help right away. Call your local emergency services (911 in [place] Do not drive yourself to the hospital. This information is not intended to replace advice given to you by your health care provider. Make sure you discuss any questions you have with your health care provider. Document Released: 06/12/2012 Document Revised: 05/19/2016 Document Reviewed: 06/14/2013 Elsevier Interactive Patient Education  2018 Elsevier Inc.  

## 2018-01-08 ENCOUNTER — Encounter (HOSPITAL_COMMUNITY): Payer: Self-pay | Admitting: *Deleted

## 2018-01-09 NOTE — Addendum Note (Signed)
Addended by: Lianne Cure A on: 01/09/2018 02:50 PM   Modules accepted: Orders

## 2018-01-10 ENCOUNTER — Encounter: Payer: Self-pay | Admitting: Podiatry

## 2018-01-10 ENCOUNTER — Ambulatory Visit: Payer: Medicare Other | Admitting: Podiatry

## 2018-01-10 DIAGNOSIS — M79676 Pain in unspecified toe(s): Secondary | ICD-10-CM | POA: Diagnosis not present

## 2018-01-10 DIAGNOSIS — B351 Tinea unguium: Secondary | ICD-10-CM

## 2018-01-10 DIAGNOSIS — E1159 Type 2 diabetes mellitus with other circulatory complications: Secondary | ICD-10-CM

## 2018-01-10 DIAGNOSIS — I739 Peripheral vascular disease, unspecified: Secondary | ICD-10-CM

## 2018-01-10 NOTE — Progress Notes (Signed)
Patient ID: Andrew [name], male   DOB: 04/10/1928, 84 y.o.   MRN: [id number] Complaint:  Visit Type: Patient returns to my office for continued preventative foot care services. Complaint: Patient states" my nails have grown long and thick and become painful to walk and wear shoes" Patient has been diagnosed with DM with no foot complications. The patient presents for preventative foot care services. No changes to ROS.  Patient is on eliquiss and pletal.  Podiatric Exam: Vascular: dorsalis pedis and posterior tibial pulses are palpable bilateral. Capillary return is immediate. Temperature gradient is WNL. Skin turgor WNL  Sensorium: Normal Semmes Weinstein monofilament test. Normal tactile sensation bilaterally. Nail Exam: Pt has thick disfigured discolored nails with subungual debris noted bilateral entire nail hallux through fifth toenails Ulcer Exam: There is no evidence of ulcer or pre-ulcerative changes or infection. Orthopedic Exam: Muscle tone and strength are WNL. No limitations in general ROM. No crepitus or effusions noted. Foot type and digits show no abnormalities. Bony prominences are unremarkable. 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 Skin: No Porokeratosis. No infection or ulcers  Diagnosis:  Onychomycosis, , Pain in right toe, pain in left toes  Treatment & Plan Procedures and Treatment: Consent by patient was obtained for treatment procedures. The patient understood the discussion of treatment and procedures well. All questions were answered thoroughly reviewed. Debridement of mycotic and hypertrophic toenails, 1 through 5 bilateral and clearing of subungual debris. No ulceration, no infection noted.  Return Visit-Office Procedure: Patient instructed to return to the office for a follow up visit 10 weeks. for continued evaluation and treatment.   Caeleb Batalla DPM 

## 2018-01-13 ENCOUNTER — Other Ambulatory Visit: Payer: Self-pay | Admitting: Internal Medicine

## 2018-01-13 ENCOUNTER — Other Ambulatory Visit (HOSPITAL_COMMUNITY): Payer: Self-pay | Admitting: Cardiology

## 2018-01-18 ENCOUNTER — Telehealth: Payer: Self-pay | Admitting: *Deleted

## 2018-01-18 NOTE — Telephone Encounter (Signed)
I left a message on pt's wife, mobile phone to contact me to discuss the callous on pt's right foot and an appt.

## 2018-01-18 NOTE — Telephone Encounter (Signed)
I spoke with pt, informed that Clair Gulling, PT was concerned and he felt the callous on his right foot should be evaluated. Pt stated I would need to talk with his wife to schedule.

## 2018-01-18 NOTE — Telephone Encounter (Signed)
Andrew [name], [place] called 01/16/2018 and I had accidentally pushed the message under my phone and had seen it when I drug the phone while talking and filing. I apologized to Andrew [name], PT and told him in some cases surgery would be advised for the underlining problem causing the callous, but at pt's age our office often performed palliative care to shave the callous down for comfort, soft shoes and padding the area off. Andrew [name], Pt states the callous is peeling away. I told him I would contact the pt to get him in because cracking may possibly occur and the area worsen. Andrew [name], PT states pt may have some symptoms of dementia, and I should speak with pt's wife.

## 2018-01-19 ENCOUNTER — Ambulatory Visit (HOSPITAL_COMMUNITY)
Admission: RE | Admit: 2018-01-19 | Discharge: 2018-01-19 | Disposition: A | Discharge status: Home / Self Care | Payer: Medicare Other | Source: Ambulatory Visit | Attending: Cardiology | Admitting: Cardiology

## 2018-01-19 ENCOUNTER — Encounter (HOSPITAL_COMMUNITY): Payer: Self-pay | Admitting: Cardiology

## 2018-01-19 VITALS — BP 102/58 | HR 74 | Wt 200.0 lb

## 2018-01-19 DIAGNOSIS — I5032 Chronic diastolic (congestive) heart failure: Secondary | ICD-10-CM | POA: Diagnosis not present

## 2018-01-19 DIAGNOSIS — J449 Chronic obstructive pulmonary disease, unspecified: Secondary | ICD-10-CM | POA: Insufficient documentation

## 2018-01-19 DIAGNOSIS — I5022 Chronic systolic (congestive) heart failure: Secondary | ICD-10-CM | POA: Insufficient documentation

## 2018-01-19 DIAGNOSIS — K589 Irritable bowel syndrome without diarrhea: Secondary | ICD-10-CM | POA: Diagnosis not present

## 2018-01-19 DIAGNOSIS — E1122 Type 2 diabetes mellitus with diabetic chronic kidney disease: Secondary | ICD-10-CM | POA: Diagnosis not present

## 2018-01-19 DIAGNOSIS — N183 Chronic kidney disease, stage 3 (moderate): Secondary | ICD-10-CM | POA: Insufficient documentation

## 2018-01-19 DIAGNOSIS — E785 Hyperlipidemia, unspecified: Secondary | ICD-10-CM | POA: Diagnosis not present

## 2018-01-19 DIAGNOSIS — E039 Hypothyroidism, unspecified: Secondary | ICD-10-CM | POA: Insufficient documentation

## 2018-01-19 DIAGNOSIS — E032 Hypothyroidism due to medicaments and other exogenous substances: Secondary | ICD-10-CM

## 2018-01-19 DIAGNOSIS — I714 Abdominal aortic aneurysm, without rupture: Secondary | ICD-10-CM | POA: Insufficient documentation

## 2018-01-19 DIAGNOSIS — I779 Disorder of arteries and arterioles, unspecified: Secondary | ICD-10-CM | POA: Diagnosis not present

## 2018-01-19 DIAGNOSIS — Z85820 Personal history of malignant melanoma of skin: Secondary | ICD-10-CM | POA: Diagnosis not present

## 2018-01-19 DIAGNOSIS — R942 Abnormal results of pulmonary function studies: Secondary | ICD-10-CM

## 2018-01-19 DIAGNOSIS — Z8249 Family history of ischemic heart disease and other diseases of the circulatory system: Secondary | ICD-10-CM | POA: Insufficient documentation

## 2018-01-19 DIAGNOSIS — K219 Gastro-esophageal reflux disease without esophagitis: Secondary | ICD-10-CM | POA: Diagnosis not present

## 2018-01-19 DIAGNOSIS — I480 Paroxysmal atrial fibrillation: Secondary | ICD-10-CM | POA: Insufficient documentation

## 2018-01-19 DIAGNOSIS — Z79899 Other long term (current) drug therapy: Secondary | ICD-10-CM | POA: Insufficient documentation

## 2018-01-19 DIAGNOSIS — Z87891 Personal history of nicotine dependence: Secondary | ICD-10-CM | POA: Diagnosis not present

## 2018-01-19 DIAGNOSIS — Z8673 Personal history of transient ischemic attack (TIA), and cerebral infarction without residual deficits: Secondary | ICD-10-CM | POA: Insufficient documentation

## 2018-01-19 DIAGNOSIS — I2510 Atherosclerotic heart disease of native coronary artery without angina pectoris: Secondary | ICD-10-CM | POA: Diagnosis not present

## 2018-01-19 DIAGNOSIS — Z951 Presence of aortocoronary bypass graft: Secondary | ICD-10-CM | POA: Diagnosis not present

## 2018-01-19 DIAGNOSIS — Z9889 Other specified postprocedural states: Secondary | ICD-10-CM | POA: Diagnosis not present

## 2018-01-19 DIAGNOSIS — I4892 Unspecified atrial flutter: Secondary | ICD-10-CM | POA: Diagnosis not present

## 2018-01-19 DIAGNOSIS — Z7902 Long term (current) use of antithrombotics/antiplatelets: Secondary | ICD-10-CM | POA: Diagnosis not present

## 2018-01-19 DIAGNOSIS — J849 Interstitial pulmonary disease, unspecified: Secondary | ICD-10-CM | POA: Diagnosis not present

## 2018-01-19 DIAGNOSIS — I252 Old myocardial infarction: Secondary | ICD-10-CM | POA: Insufficient documentation

## 2018-01-19 DIAGNOSIS — I6529 Occlusion and stenosis of unspecified carotid artery: Secondary | ICD-10-CM | POA: Insufficient documentation

## 2018-01-19 DIAGNOSIS — I255 Ischemic cardiomyopathy: Secondary | ICD-10-CM | POA: Insufficient documentation

## 2018-01-19 DIAGNOSIS — E1151 Type 2 diabetes mellitus with diabetic peripheral angiopathy without gangrene: Secondary | ICD-10-CM | POA: Insufficient documentation

## 2018-01-19 DIAGNOSIS — N179 Acute kidney failure, unspecified: Secondary | ICD-10-CM | POA: Diagnosis not present

## 2018-01-19 LAB — CBC
HEMATOCRIT: 35.3 % — AB (ref 39.0–52.0)
HEMOGLOBIN: 11.5 g/dL — AB (ref 13.0–17.0)
MCH: 33.2 pg (ref 26.0–34.0)
MCHC: 32.6 g/dL (ref 30.0–36.0)
MCV: 102 fL — AB (ref 78.0–100.0)
Platelets: 187 10*3/uL (ref 150–400)
RBC: 3.46 MIL/uL — AB (ref 4.22–5.81)
RDW: 13.6 % (ref 11.5–15.5)
WBC: 9 10*3/uL (ref 4.0–10.5)

## 2018-01-19 LAB — COMPREHENSIVE METABOLIC PANEL
ALT: 21 U/L (ref 17–63)
ANION GAP: 12 (ref 5–15)
AST: 29 U/L (ref 15–41)
Albumin: 3.8 g/dL (ref 3.5–5.0)
Alkaline Phosphatase: 48 U/L (ref 38–126)
BUN: 56 mg/dL — ABNORMAL HIGH (ref 6–20)
CHLORIDE: 101 mmol/L (ref 101–111)
CO2: 27 mmol/L (ref 22–32)
Calcium: 9 mg/dL (ref 8.9–10.3)
Creatinine, Ser: 2.97 mg/dL — ABNORMAL HIGH (ref 0.61–1.24)
GFR calc non Af Amer: 18 mL/min — ABNORMAL LOW (ref >60)
GFR, EST AFRICAN AMERICAN: 20 mL/min — AB (ref >60)
Glucose, Bld: 134 mg/dL — ABNORMAL HIGH (ref 65–99)
Potassium: 4.9 mmol/L (ref 3.5–5.1)
SODIUM: 140 mmol/L (ref 135–145)
Total Bilirubin: 0.8 mg/dL (ref 0.3–1.2)
Total Protein: 7.1 g/dL (ref 6.5–8.1)

## 2018-01-19 LAB — TSH: TSH: 179.927 u[IU]/mL — ABNORMAL HIGH (ref 0.350–4.500)

## 2018-01-19 MED ORDER — LOSARTAN POTASSIUM 50 MG PO TABS: 50.0000 mg | ORAL_TABLET | Freq: Every day | ORAL | 3 refills | Status: DC

## 2018-01-19 MED ORDER — FUROSEMIDE 80 MG PO TABS: 80.0000 mg | ORAL_TABLET | Freq: Two times a day (BID) | ORAL | 6 refills | Status: DC

## 2018-01-19 NOTE — Patient Instructions (Signed)
Stop Nifedipine   Decrease Furosamide 80 mg (1 tab), twice a day  Decrease Losartan 50 mg (1 tab) daily  Labs drawn today (if we do not call you, then your lab work was stable)   You have been referred to Pulmonologist Dr. Waunita Schooner call to get appointment  Non-Cardiac CT scanning, (CAT scanning), is a noninvasive, special x-ray that produces cross-sectional images of the body using x-rays and a computer. CT scans help physicians diagnose and treat medical conditions. For some CT exams, a contrast material is used to enhance visibility in the area of the body being studied. CT scans provide greater clarity and reveal more details than regular x-ray exams.  Your physician recommends that you schedule a follow-up appointment in: 3 week with Dr. [name]

## 2018-01-21 NOTE — Progress Notes (Signed)
Patient ID: Andrew [name], male   DOB: 1928/09/17, 84 y.o.   MRN: [id number] PCP: Dr. Jerline Pain Cardiology: Dr. [name]   84 y.o. with history of CKD, CAD s/p CABG, and ischemic cardiomyopathy with primarily diastolic CHF presents for followup of CHF and CAD.  He has a history of PAD followed at [facility].  I took him for [facility] in 11/15. 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  He had had occlusion of a relatively small LAD, there were right to left collaterals and no intervention was done (medical management).    He and his wife went on a cruise along the [employer] in 5/16.  He admits to considerable dietary indiscretion (high sodium diet).  It appears that he developed a hypertensive crisis along with chest pain while on the ship. He went into atrial fibrillation and developed CHF.  He was taken to the hospital in [place], [place].  He was in the ICU for about a week on Bipap intermittently.  Troponin peaked at 0.09 during this admission.  He was diuresed and after about 2 wks left the hospital and was able to fly home.    Cardiolite (6/16) showed EF 42%, prior anterolateral MI, no ischemia. Echo in 10/16 with EF 40-45%.    He was admitted on 07/06/15 for RLQ pain and fever.  He was treated for UTI and had a kidney stone as well.  He developed SOB after IVF were given for AKI.  He was treated for acute COPD exacerbation as well as CHF decompensation.  He was diuresed with IV lasix.  His weight on admission was 179 and got as high as 188 after IVF. Discharge weight was 181.  He was admitted in 10/16 with a cerebellar CVA.  He has some resultant imbalance.   He was again admitted in 11/16 with acute on chronic systolic CHF.  This was a short admission that appeared to be precipitated by a sodium load from eating at a [employer] .   3/18 Echo showed stable EF 40-45%.  He was also having chest pain so I did a Cardiolite in 3/18, no ischemia.   He was admitted in 11/18 for elective hernia  repair. Post-op, he developed hypoxemia and had a rehab stay.  He is now home and off oxygen.   After last appointment, creatinine was noted to be up to 2.7.  Unfortunately, we were unable to get in touch with him despite multiple phone calls to change his meds.  Creatinine today is 2.97.   He returns for followup of CHF.  He has stable calf claudication after walking about a block, no lower extremity ulcers.  Recent ABIs done at [facility] showed improvement. No chest pain.  He is short of breath walking about a block.  He sleeps in a recliner but has done this for years.  No PND.  Symptomatically, he says that he is unchanged.   Labs (12/14): K 4.8, creatinine 1.5 Labs (5/15): LDL 92 Labs (11/15): K 4.1, creatinine 1.5 Labs (12/15): K 4, creatinine 1.5 Labs (1/16): LFTs normal, LDL 51, HDL 47 Labs (5/16): TnI 0.09 Labs (6/16): K 4.8 => 4.4, creatinine 1.79 => 2.26, HCT 28.7 Labs (7/16) K 4.2, creatinine 1.98, LFTs normal, TSH normal Labs (8/16): HCT 27.1 Labs (9/16): hgb 8.1, K 4.3, creatinine 1.8, LFTs normal, TSH elevated Labs (10/16): LDL 74, HDL 45, TSH 5.19 (increased), free T3 and free T4 normal.  Labs (11/16): K 3.5, creatinine 1.47, AST 84,  ALT 89 Labs (12/16): K 4.3, creatinine 1.58, AST 43, ALT 42 Labs (3/17): K 4.2, creatinine 1.4, hgb 10.7, AST 36, ALT 45 Labs (6/17): K 4, creatinine 1.69, hgb 10.9, proBNP 389, TSH mildly elevated Labs (8/17): Free T4 and T3 normal, TSH mildly elevated, K 4.2, creatinine 1.7, LFTs normal, hgb 10.4 Labs (12/17): TSH elevated but free T3 and T4 normal, K 4.5, creatinine 1.9, LDL 45, HDL 39, LFTs normal, HCT 32.9 Labs (1/18): K 4.4, creatinine 1.93 Labs (3/18): K 4.3, creatinine 1.87, BNP 187, hgb 11.1, LDL 75, LFTs normal Labs (4/18): K 4.1, creatinine 1.7 Labs (9/18): K 4, creatinine 1.8, hgb 11.9 Labs (10/18): free T4 0.4, free T3 1.9, TSH 139  Labs (12/18): K 3.4, creatinine 1.76, hgb 12.2 Labs (1/19): K 4 => 4.9, creatinine 2.7 => 2.97, TSH  179  PMH: 1. CKD stage 3 2. HTN 3. Hyperlipidemia 4. AAA: s/p surgery with left renal artery bypass in 1997 - Abdominal US 10/18 with 3.2 AAA 5. GERD 6. IBS 7. Melanoma s/p reception 8. Diabetes: Diet-controlled 9. Carotid stenosis: Carotid dopplers (4/15) with 40-59% bilateral stenosis. Carotid dopplers (7/16) with < 40% BICA stenosis.  Carotid dopplers (10/16) with 40-59% BICA stenosis.  - Carotids (8/17) with minimal disease.  - Carotids (10/18) with 1-39% BICA stenosis.  - Carotids (1/19): 1-39% BICA stenosis 10. PAD: 9/14 ABIs 0.91 right 1.09 left.  7/16 ABIs 0.86 right, 1.1 left; aortoiliac duplex with < 50% bilateral iliac stenosis.  - Aortoiliac duplex (8/17) with < 50% bilateral iliac stenosis.   - ABIs (10/17): left 0.91, right 0.98, left TBI 0.44 (abnormal).  - ABIs (7/18): right 0.97, right 0.75 - ABIs (1/19): right 1.04, left 1.03 11. CAD: Has super-dominant RCA. s/p CABG 1992.  He had Taxus DES to mid PDA in 5/02. LHC (1/05) with 90% ostial PDA (had DES to this vessel), totally occluded RIMA-PDA, totally occluded PLV, sequential SVG-PLV with 1 branch occluded.  LHC (11/15) with left main off the right cusp, 40-50% ostial left main, total occlusion of the proximal LAD (small vessel) with right to left collaterals, large super-dominant RCA with 50% ISR mid RCA, 50% ostial PDA, patent SVG-PLV, RIMA-PDA known atretic. Lexiscan Cardiolite (6/16) with EF 42%, prior anterolateral MI, no ischemia.  - Lexiscan Cardiolite (3/18): EF 51%, basal to mid anterolateral fixed defect with no ischemia.  12. Ischemic cardiomyopathy: Echo (12/09) with EF 45-50%, basal to mid inferolateral hypokinesis. Echo (6/15) with EF 45-50%, akinesis of the mid to apical inferolateral wall.  Echo (6/16) with EF 50-55%, mild LVH, inferior hypokinesis, mild MR.  Echo (10/16) with EF 40-45%, moderate LVH, mildly decreased RV systolic function.  - Echo (3/18) with EF 40-45%, mid anterior hypokinesis.  13. OA 14.  Atrial fibrillation/flutter: Paroxysmal.  Noted during 5/16 hospitalization in [place] and in 6/16.  He developed atrial flutter in 8/16, back in NSR by 9/16.  15. Emphysema: PFTs (8/16) with FVC 88%, FEV1 74%, ratio 81%, TLC 83%, DLCO 31%. - PFTs (12/18): FVC 64%, FEV1 69%, ratio 105%, TLC 82%, DLCO 33% => mild obstruction, severely decreased DLCO (similar to prior).   16. Renal cysts 17. Idiopathic peripheral neuropathy.  18. Anemia.  22. 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  20. Hernia repair (11/18) 21. 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  FH: Brother with CAD.   ROS: All systems reviewed and negative except as per HPI.   Current Outpatient Medications  Medication Sig Dispense Refill  .  atorvastatin (LIPITOR) 80 MG tablet TAKE 1 TABLET ONCE DAILY. 90 tablet 3  . carvedilol (COREG) 12.5 MG tablet TAKE 1 TABLET TWICE DAILY. (Patient taking differently: Take 12.5 mg by mouth twice daily) 60 tablet 3  . ELIQUIS 2.5 MG TABS tablet TAKE 1 TABLET TWICE DAILY. (Patient taking differently: Take 2.5 mg by mouth twice daily) 60 tablet 3  . furosemide (LASIX) 80 MG tablet Take 1 tablet (80 mg total) by mouth 2 (two) times daily. 60 tablet 6  . losartan (COZAAR) 50 MG tablet Take 1 tablet (50 mg total) by mouth daily. 30 tablet 3  . ACCU-CHEK AVIVA PLUS test strip CHECK BLOOD SUGAR 4 TIMES A DAY. 100 each 11  . acetaminophen (TYLENOL) 500 MG tablet Take 1,000 mg by mouth every 8 (eight) hours as needed for moderate pain.     Marland Kitchen ALPRAZolam (XANAX) 0.5 MG tablet TAKE 1/2 TABLET TWICE DAILY AS NEEDED FOR ANXIETY. (Patient taking differently: Take 0.25 mg by mouth twice daily as needed for anxiety) 60 tablet 5  . amiodarone (PACERONE) 100 MG tablet Take 1 tablet (100 mg total) by mouth daily. 30 tablet 3  . cilostazol (PLETAL) 100 MG tablet TAKE 1 TABLET TWICE DAILY. (Patient taking differently: Take 100 mg by mouth twice daily) 180 tablet 3  .  ezetimibe (ZETIA) 10 MG tablet TAKE (1) TABLET DAILY. 90 tablet 3  . FLUoxetine (PROZAC) 20 MG capsule TAKE (1) CAPSULE DAILY. (Patient taking differently: Take 20 mg by mouth daily) 90 capsule 0  . HYDROcodone-acetaminophen (NORCO/VICODIN) 5-325 MG tablet Take 1 tablet by mouth every 4 (four) hours as needed for moderate pain. 24 tablet 0  . levothyroxine (SYNTHROID, LEVOTHROID) 25 MCG tablet Take 1 tablet (25 mcg total) by mouth daily before breakfast. 30 tablet 3  . Multiple Vitamin (MULTIVITAMIN) tablet Take 1 tablet by mouth daily.    Marland Kitchen NITROSTAT 0.4 MG SL tablet DISSOLVE 1 TABLET UNDER TONGUE AS NEEDED FOR CHEST PAIN,MAY REPEAT IN5 MINUTES FOR 2 DOSES. (Patient taking differently: DISSOLVE 0.4 MG UNDER TONGUE AS NEEDED FOR CHEST PAIN,MAY REPEAT IN5 MINUTES FOR 2 DOSES.) 25 tablet 6  . potassium chloride 20 MEQ TBCR Take 20 mEq by mouth daily. 30 tablet 3  . temazepam (RESTORIL) 30 MG capsule TAKE 1 CAPSULE AT BEDTIME AS NEEDED FOR SLEEP. (Patient taking differently: Take 30 mg by mouth at bedtime as needed for sleep) 30 capsule 3  . tiotropium (SPIRIVA HANDIHALER) 18 MCG inhalation capsule Place 1 capsule (18 mcg total) into inhaler and inhale daily. 30 capsule 12  . traMADol (ULTRAM) 50 MG tablet Take 1 tablet (50 mg total) by mouth every 6 (six) hours as needed. (Patient taking differently: Take 50 mg by mouth every 6 (six) hours as needed for moderate pain. ) 12 tablet 0   No current facility-administered medications for this encounter.    BP (!) 102/58 (BP Location: Right Arm, Patient Position: Sitting, Cuff Size: Normal)   Pulse 74   Wt 200 lb (90.7 kg)   SpO2 92%   BMI 31.32 kg/m   General: NAD Neck: JVP 7-8 cm, no thyromegaly or thyroid nodule.  Lungs: Clear with prolonged expiratory phase.  CV: Nondisplaced PMI.  Heart regular S1/S2, no S3/S4, no murmur.  1+ edema 1/2 up lower legs bilaterally.  No carotid bruit.  Unable to palpate pedal pulses.  Abdomen: Soft, nontender, no  hepatosplenomegaly, no distention.  Skin: Intact without lesions or rashes.  Neurologic: Alert and oriented x 3.  Psych: Normal  affect. Extremities: No clubbing or cyanosis.  HEENT: Normal.   Assessment/Plan:  1. CAD: s/p CABG.  Cath in 11/15 showed occluded LAD with left to right collaterals.  The LAD was a relatively small vessel (super-dominant right).  Managed medically. Lexiscan Cardiolite in 6/16 with infarction but no ischemia.  Cardiolite 3/18 with infarction, no ischemia. No chest pain.  - Continue ASA 81.   - Continue statin. - Continue Coreg.   2. Ischemic cardiomyopathy with systolic CHF: EF 35-36% on 3/18 echo.  NYHA class III symptoms, stable. He is not volume overloaded on exam.   - I will have him stop losartan with AKI.  - Continue Coreg 12.5 mg bid.    - Hold Lasix x 2 days then decrease to 80 mg daily.  Weigh daily, add Lasix 80 mg qpm x 2 days with rise in weight > 2-3 lbs overnight or 4 lbs in a week.  3. Carotid stenosis: Stable, followed at [facility].  4. PAD: Stable claudication, ABIs with some improvement in 1/19.  No pedal ulcers.  Seen by [facility], plan for medical management.  - Continue cilostazol, unable to come off due to worsening claudication when he stops it.  5. Hyperlipidemia: Good lipids in 3/18.  6. CKD III: BMET done today, creatinine up to 2.97.  - He will stop losartan and cut back on Lasix as above.  Repeat BMET 10 days.  7. Atrial fibrillation/flutter:  Paroxysmal, NSR today by exam.   - Continue amiodarone 100 mg daily. Will need to get regular eye exams with amiodarone use.  Check LFTs today.  Given abnormal PFTs, will get high resolution CT chest to look for any evidence of amiodarone lung toxicity.  - Continue Eliquis 2.5 bid. CBC today.  8. Suspected OSA: He cancelled his sleep study due to mask discomfort 9. COPD: He is now back off oxygen.  - Based on PFT abnormality with mild obstruction but severely decreased DLCO, will get high resolution CT chest  as above to assess for any evidence of amiodarone lung toxicity.   - I would like him to be seen by pulmonology, will refer.   10. CVA: Cerebellar, 10/16.  He has had some resulting imbalance.  He is no longer driving.  11. Hypertension: BP on the low side, as above, will stop losartan.  12. Hypothyroidism: Likely related to amiodarone.  At last appointment, I added levothyroxine.  TSH was still very high when checked today.  - Increase levothyroxine to 75 mcg daily, send lab to PCP for ongoing followup.      Followup 3 wks.   Loralie Champagne 01/21/2018

## 2018-01-22 ENCOUNTER — Ambulatory Visit: Payer: Medicare Other | Admitting: Family Medicine

## 2018-01-24 ENCOUNTER — Telehealth: Payer: Self-pay | Admitting: *Deleted

## 2018-01-24 ENCOUNTER — Other Ambulatory Visit (HOSPITAL_COMMUNITY): Payer: Self-pay | Admitting: *Deleted

## 2018-01-24 MED ORDER — LEVOTHYROXINE SODIUM 75 MCG PO TABS: 75.0000 ug | ORAL_TABLET | Freq: Every day | ORAL | 3 refills | Status: DC

## 2018-01-24 NOTE — Telephone Encounter (Signed)
I informed pt's wife, Vinnie Level, that I was calling to help her get an appt for her husband to be seen for the callous on the right foot, and I had left a message on her mobile phone 01/18/2018, but this time I would transfer her to the scheduler to get an appt.

## 2018-01-24 NOTE — Telephone Encounter (Signed)
Andrew [name], [place] states both the pt and wife states they have not heard from our office concerning the callous on the right foot.

## 2018-01-24 NOTE — Telephone Encounter (Signed)
Left message informing Andrew [name], PT I had spoken to pt on 01/18/2018 concerning the right foot callous and pt told me to contact his wife to schedule, and I had left a message on wife's mobile phone to contact our office for an appt.

## 2018-01-25 ENCOUNTER — Telehealth: Payer: Self-pay | Admitting: Family Medicine

## 2018-01-25 ENCOUNTER — Ambulatory Visit (HOSPITAL_COMMUNITY)
Admission: RE | Admit: 2018-01-25 | Discharge: 2018-01-25 | Disposition: A | Discharge status: Home / Self Care | Payer: Medicare Other | Source: Ambulatory Visit | Attending: Cardiology | Admitting: Cardiology

## 2018-01-25 DIAGNOSIS — R911 Solitary pulmonary nodule: Secondary | ICD-10-CM | POA: Insufficient documentation

## 2018-01-25 DIAGNOSIS — J849 Interstitial pulmonary disease, unspecified: Secondary | ICD-10-CM | POA: Insufficient documentation

## 2018-01-25 DIAGNOSIS — J479 Bronchiectasis, uncomplicated: Secondary | ICD-10-CM | POA: Diagnosis not present

## 2018-01-25 DIAGNOSIS — I700 Atherosclerosis of aorta: Secondary | ICD-10-CM | POA: Insufficient documentation

## 2018-01-25 NOTE — Telephone Encounter (Signed)
Spoke with Jim and gave verbal orders. 

## 2018-01-25 NOTE — Telephone Encounter (Signed)
Please advise 

## 2018-01-25 NOTE — Telephone Encounter (Signed)
Copied from [place] [id number]. Topic: Inquiry >> Jan 25, 2018 12:53 PM Corie Chiquito, [place] wrote: Reason for CRM: Clair Gulling called from [facility] to let Dr.Parker know that patient had a fall on Jan 16th  with no injuries Plan was to discharge patient on jan 30th but due to fall and recent changes to cardiac medication requesting to continue 1 time a week for 2 week which is the end of the certification period for physical therapy. If someone could give him a call back about this at [phone]

## 2018-01-26 ENCOUNTER — Other Ambulatory Visit (HOSPITAL_COMMUNITY): Payer: Self-pay

## 2018-01-26 ENCOUNTER — Telehealth: Payer: Self-pay | Admitting: Physician Assistant

## 2018-01-26 NOTE — Telephone Encounter (Signed)
Pt wife called because she is concerned that she missed a message about his heart medications.  Reviewed noted from 01/25 and the med changes at that time, she is aware.  Advised her that phone notes since then are all dealing with a foot callus, not heart meds.  Rosaria Ferries, PA-C 01/26/2018 6:13 PM [place] [phone]

## 2018-01-31 ENCOUNTER — Ambulatory Visit: Payer: Medicare Other | Admitting: Family Medicine

## 2018-02-01 ENCOUNTER — Telehealth (HOSPITAL_COMMUNITY): Payer: Self-pay | Admitting: *Deleted

## 2018-02-01 ENCOUNTER — Telehealth: Payer: Self-pay | Admitting: Family Medicine

## 2018-02-01 DIAGNOSIS — I5022 Chronic systolic (congestive) heart failure: Secondary | ICD-10-CM

## 2018-02-01 MED ORDER — FUROSEMIDE 80 MG PO TABS: ORAL_TABLET | ORAL | 6 refills | Status: DC

## 2018-02-01 NOTE — Telephone Encounter (Signed)
I want him to increase Lasix to 120 mg bid x 2 days then 120 mg qam/80 mg qpm.  Have him come in for BMET tomorrow and see if we can fit him on Andy's schedule.  Otherwise will need to see me Monday or Tuesday.   Katrina: this is different from the staff message I just sent you, go with the above instructions.

## 2018-02-01 NOTE — Telephone Encounter (Signed)
Please advise 

## 2018-02-01 NOTE — Telephone Encounter (Signed)
Verbal orders given  

## 2018-02-01 NOTE — Telephone Encounter (Signed)
Copied from [place]. Topic: General - Other >> Feb 01, 2018  2:00 PM Darl Householder, RMA wrote: Reason for CRM: Margaretha Sheffield [facility] is requesting verbal orders for additional skilled nursing once a week x4 weeks, due to pt dementia would like to do tele monitoring for weight and vital signs for disease management and med teaching, please return call to [phone]

## 2018-02-01 NOTE — Telephone Encounter (Signed)
pts wife aware and agreeable with plan.

## 2018-02-01 NOTE — Telephone Encounter (Signed)
Clair Gulling called to report that patient is c/o increased shortness of breath. He has +3/+4 lower extremity edema. O2 saturation 86-87%. Pts weight is up from 198lbs on 2/4 to 205lbs today. Pt is taking all medications as prescribed. Clair Gulling asks that we call patients wife with any medication changes or recommendations.  Message routed to Dr. [name] for advice.

## 2018-02-02 ENCOUNTER — Ambulatory Visit (HOSPITAL_COMMUNITY)
Admission: RE | Admit: 2018-02-02 | Discharge: 2018-02-02 | Disposition: A | Discharge status: Home / Self Care | Payer: Medicare Other | Source: Ambulatory Visit | Attending: Cardiology | Admitting: Cardiology

## 2018-02-02 ENCOUNTER — Telehealth (HOSPITAL_COMMUNITY): Payer: Self-pay | Admitting: *Deleted

## 2018-02-02 DIAGNOSIS — I5022 Chronic systolic (congestive) heart failure: Secondary | ICD-10-CM | POA: Diagnosis not present

## 2018-02-02 LAB — BASIC METABOLIC PANEL
Anion gap: 13 (ref 5–15)
BUN: 35 mg/dL — AB (ref 6–20)
CO2: 21 mmol/L — ABNORMAL LOW (ref 22–32)
CREATININE: 2.19 mg/dL — AB (ref 0.61–1.24)
Calcium: 8.6 mg/dL — ABNORMAL LOW (ref 8.9–10.3)
Chloride: 106 mmol/L (ref 101–111)
GFR calc Af Amer: 29 mL/min — ABNORMAL LOW (ref >60)
GFR, EST NON AFRICAN AMERICAN: 25 mL/min — AB (ref >60)
GLUCOSE: 181 mg/dL — AB (ref 65–99)
Potassium: 4.6 mmol/L (ref 3.5–5.1)
Sodium: 140 mmol/L (ref 135–145)

## 2018-02-02 NOTE — Telephone Encounter (Signed)
That would be fine 

## 2018-02-02 NOTE — Telephone Encounter (Signed)
Advanced Heart Failure Triage Encounter  Patient Name: Andrew [name]  Date of Call: 02/02/18  Problem:   Home health called asking if we can place IV lasix protocol for patient.    Plan:  Will forward to Dr. [name].  Once he says ok I will fax order to [facility].   Darron Doom, RN

## 2018-02-05 ENCOUNTER — Ambulatory Visit (HOSPITAL_COMMUNITY)
Admission: RE | Admit: 2018-02-05 | Discharge: 2018-02-05 | Disposition: A | Discharge status: Home / Self Care | Payer: Medicare Other | Source: Ambulatory Visit | Attending: Cardiology | Admitting: Cardiology

## 2018-02-05 ENCOUNTER — Other Ambulatory Visit: Payer: Self-pay | Admitting: *Deleted

## 2018-02-05 ENCOUNTER — Encounter: Payer: Self-pay | Admitting: *Deleted

## 2018-02-05 ENCOUNTER — Telehealth: Payer: Self-pay | Admitting: *Deleted

## 2018-02-05 ENCOUNTER — Other Ambulatory Visit: Payer: Self-pay

## 2018-02-05 VITALS — BP 100/54 | HR 81 | Wt 201.2 lb

## 2018-02-05 DIAGNOSIS — K589 Irritable bowel syndrome without diarrhea: Secondary | ICD-10-CM | POA: Insufficient documentation

## 2018-02-05 DIAGNOSIS — Z951 Presence of aortocoronary bypass graft: Secondary | ICD-10-CM | POA: Insufficient documentation

## 2018-02-05 DIAGNOSIS — I739 Peripheral vascular disease, unspecified: Secondary | ICD-10-CM | POA: Diagnosis not present

## 2018-02-05 DIAGNOSIS — I5032 Chronic diastolic (congestive) heart failure: Secondary | ICD-10-CM | POA: Insufficient documentation

## 2018-02-05 DIAGNOSIS — I252 Old myocardial infarction: Secondary | ICD-10-CM | POA: Diagnosis not present

## 2018-02-05 DIAGNOSIS — I779 Disorder of arteries and arterioles, unspecified: Secondary | ICD-10-CM | POA: Diagnosis not present

## 2018-02-05 DIAGNOSIS — K219 Gastro-esophageal reflux disease without esophagitis: Secondary | ICD-10-CM | POA: Diagnosis not present

## 2018-02-05 DIAGNOSIS — I255 Ischemic cardiomyopathy: Secondary | ICD-10-CM | POA: Insufficient documentation

## 2018-02-05 DIAGNOSIS — I714 Abdominal aortic aneurysm, without rupture: Secondary | ICD-10-CM | POA: Insufficient documentation

## 2018-02-05 DIAGNOSIS — I480 Paroxysmal atrial fibrillation: Secondary | ICD-10-CM | POA: Insufficient documentation

## 2018-02-05 DIAGNOSIS — I2582 Chronic total occlusion of coronary artery: Secondary | ICD-10-CM | POA: Insufficient documentation

## 2018-02-05 DIAGNOSIS — E039 Hypothyroidism, unspecified: Secondary | ICD-10-CM | POA: Diagnosis not present

## 2018-02-05 DIAGNOSIS — E1151 Type 2 diabetes mellitus with diabetic peripheral angiopathy without gangrene: Secondary | ICD-10-CM | POA: Insufficient documentation

## 2018-02-05 DIAGNOSIS — I2510 Atherosclerotic heart disease of native coronary artery without angina pectoris: Secondary | ICD-10-CM | POA: Diagnosis present

## 2018-02-05 DIAGNOSIS — Z8673 Personal history of transient ischemic attack (TIA), and cerebral infarction without residual deficits: Secondary | ICD-10-CM | POA: Insufficient documentation

## 2018-02-05 DIAGNOSIS — J479 Bronchiectasis, uncomplicated: Secondary | ICD-10-CM | POA: Diagnosis not present

## 2018-02-05 DIAGNOSIS — I130 Hypertensive heart and chronic kidney disease with heart failure and stage 1 through stage 4 chronic kidney disease, or unspecified chronic kidney disease: Secondary | ICD-10-CM | POA: Diagnosis present

## 2018-02-05 DIAGNOSIS — R911 Solitary pulmonary nodule: Secondary | ICD-10-CM | POA: Diagnosis not present

## 2018-02-05 DIAGNOSIS — Z85820 Personal history of malignant melanoma of skin: Secondary | ICD-10-CM | POA: Diagnosis not present

## 2018-02-05 DIAGNOSIS — J449 Chronic obstructive pulmonary disease, unspecified: Secondary | ICD-10-CM | POA: Insufficient documentation

## 2018-02-05 DIAGNOSIS — Z79899 Other long term (current) drug therapy: Secondary | ICD-10-CM | POA: Insufficient documentation

## 2018-02-05 DIAGNOSIS — Z87891 Personal history of nicotine dependence: Secondary | ICD-10-CM | POA: Insufficient documentation

## 2018-02-05 DIAGNOSIS — I5022 Chronic systolic (congestive) heart failure: Secondary | ICD-10-CM

## 2018-02-05 DIAGNOSIS — R942 Abnormal results of pulmonary function studies: Secondary | ICD-10-CM

## 2018-02-05 DIAGNOSIS — I454 Nonspecific intraventricular block: Secondary | ICD-10-CM | POA: Insufficient documentation

## 2018-02-05 DIAGNOSIS — N183 Chronic kidney disease, stage 3 (moderate): Secondary | ICD-10-CM | POA: Insufficient documentation

## 2018-02-05 DIAGNOSIS — Z006 Encounter for examination for normal comparison and control in clinical research program: Secondary | ICD-10-CM

## 2018-02-05 DIAGNOSIS — I4892 Unspecified atrial flutter: Secondary | ICD-10-CM | POA: Insufficient documentation

## 2018-02-05 DIAGNOSIS — E785 Hyperlipidemia, unspecified: Secondary | ICD-10-CM | POA: Insufficient documentation

## 2018-02-05 DIAGNOSIS — E1122 Type 2 diabetes mellitus with diabetic chronic kidney disease: Secondary | ICD-10-CM | POA: Insufficient documentation

## 2018-02-05 DIAGNOSIS — I6523 Occlusion and stenosis of bilateral carotid arteries: Secondary | ICD-10-CM | POA: Insufficient documentation

## 2018-02-05 DIAGNOSIS — Z7901 Long term (current) use of anticoagulants: Secondary | ICD-10-CM | POA: Insufficient documentation

## 2018-02-05 LAB — BASIC METABOLIC PANEL
ANION GAP: 14 (ref 5–15)
BUN: 45 mg/dL — AB (ref 6–20)
CHLORIDE: 103 mmol/L (ref 101–111)
CO2: 24 mmol/L (ref 22–32)
Calcium: 8.8 mg/dL — ABNORMAL LOW (ref 8.9–10.3)
Creatinine, Ser: 2.39 mg/dL — ABNORMAL HIGH (ref 0.61–1.24)
GFR, EST AFRICAN AMERICAN: 26 mL/min — AB (ref >60)
GFR, EST NON AFRICAN AMERICAN: 23 mL/min — AB (ref >60)
Glucose, Bld: 138 mg/dL — ABNORMAL HIGH (ref 65–99)
POTASSIUM: 4 mmol/L (ref 3.5–5.1)
SODIUM: 141 mmol/L (ref 135–145)

## 2018-02-05 MED ORDER — HYDRALAZINE HCL 25 MG PO TABS: 25.0000 mg | ORAL_TABLET | Freq: Three times a day (TID) | ORAL | 3 refills | Status: DC

## 2018-02-05 MED ORDER — ISOSORBIDE MONONITRATE ER 30 MG PO TB24: 30.0000 mg | ORAL_TABLET | Freq: Every day | ORAL | 3 refills | Status: DC

## 2018-02-05 MED ORDER — STUDY - INVESTIGATIONAL MEDICATION: 1.0000 | Freq: Every day | 99 refills | Status: DC

## 2018-02-05 NOTE — Progress Notes (Signed)
RESEARCH ENCOUNTER  Patient ID: Andrew [name]  DOB: [birth year]  Mr. Yuvan Baskerville had scheduled appt with [facility].  Research RN spoke with subject and wife while present for Confirmed no potential endpoints (CV hospitalization, urgent HF visit, and/or death) occurred since last visit.

## 2018-02-05 NOTE — Progress Notes (Signed)
Was seen by Dr. [name]  02/05/18

## 2018-02-05 NOTE — Addendum Note (Signed)
Addended by: Duncan Dull on: 02/05/2018 04:15 PM   Modules accepted: Orders

## 2018-02-05 NOTE — Patient Instructions (Signed)
STOP Losartan.  START Hydralazine 25mg  (1 tablet) three times daily.  START IMDUR 30mg  (1 tablet) daily.  INCREASE Lasix to 120mg  twice daily for 4 days... Then go back to 120mg  in the AM and 80mg  in the PM.  Routine lab work today. Will notify you of abnormal results  Repeat labs (bmet) in 2 weeks.  Your provider requests you have an echocardiogram before your follow up appointment.  Follow up in 3 weeks.

## 2018-02-05 NOTE — Progress Notes (Signed)
Patient ID: Andrew [name], male   DOB: Aug 13, 1928, 84 y.o.   MRN: [id number] PCP: Dr. Jerline Pain Cardiology: Dr. [name]   84 y.o. with history of CKD, CAD s/p CABG, and ischemic cardiomyopathy with primarily diastolic CHF presents for followup of CHF and CAD.  He has a history of PAD followed at [facility].  I took him for [facility] in 11/15. 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  He had had occlusion of a relatively small LAD, there were right to left collaterals and no intervention was done (medical management).    He and his wife went on a cruise along the [employer] in 5/16.  He admits to considerable dietary indiscretion (high sodium diet).  It appears that he developed a hypertensive crisis along with chest pain while on the ship. He went into atrial fibrillation and developed CHF.  He was taken to the hospital in [place], [place].  He was in the ICU for about a week on Bipap intermittently.  Troponin peaked at 0.09 during this admission.  He was diuresed and after about 2 wks left the hospital and was able to fly home.    Cardiolite (6/16) showed EF 42%, prior anterolateral MI, no ischemia. Echo in 10/16 with EF 40-45%.    He was admitted on 07/06/15 for RLQ pain and fever.  He was treated for UTI and had a kidney stone as well.  He developed SOB after IVF were given for AKI.  He was treated for acute COPD exacerbation as well as CHF decompensation.  He was diuresed with IV lasix.  His weight on admission was 179 and got as high as 188 after IVF. Discharge weight was 181.  He was admitted in 10/16 with a cerebellar CVA.  He has some resultant imbalance.   He was again admitted in 11/16 with acute on chronic systolic CHF.  This was a short admission that appeared to be precipitated by a sodium load from eating at a [employer] .   3/18 Echo showed stable EF 40-45%.  He was also having chest pain so I did a Cardiolite in 3/18, no ischemia.   He was admitted in 11/18 for elective hernia  repair. Post-op, he developed hypoxemia and had a rehab stay.  He is now home and off oxygen.   Creatinine increased to 2.97 in 1/19.  I cut back on losartan and Lasix.  He developed increased lower extremity edema and we increased Lasix back to 120 qam/80 qpm.   He returns for followup of CHF.  He has stable calf claudication after walking about a block, no lower extremity ulcers.  No chest pain.  He remains short of breath after walking about a block.  He has bendopnea.  Uses cane for balance.  No orthopnea/PND.  More easily fatigued than in the past.  Legs are swelling.  BP has been controlled, mostly SBP < 140 when he checks at home.  Weight is stable.   ECG: NSR, 1st degree AVB, IVCD 136 msec (personally reviewed).   Labs (12/14): K 4.8, creatinine 1.5 Labs (5/15): LDL 92 Labs (11/15): K 4.1, creatinine 1.5 Labs (12/15): K 4, creatinine 1.5 Labs (1/16): LFTs normal, LDL 51, HDL 47 Labs (5/16): TnI 0.09 Labs (6/16): K 4.8 => 4.4, creatinine 1.79 => 2.26, HCT 28.7 Labs (7/16) K 4.2, creatinine 1.98, LFTs normal, TSH normal Labs (8/16): HCT 27.1 Labs (9/16): hgb 8.1, K 4.3, creatinine 1.8, LFTs normal, TSH elevated Labs (10/16): LDL 74,  HDL 45, TSH 5.19 (increased), free T3 and free T4 normal.  Labs (11/16): K 3.5, creatinine 1.47, AST 84, ALT 89 Labs (12/16): K 4.3, creatinine 1.58, AST 43, ALT 42 Labs (3/17): K 4.2, creatinine 1.4, hgb 10.7, AST 36, ALT 45 Labs (6/17): K 4, creatinine 1.69, hgb 10.9, proBNP 389, TSH mildly elevated Labs (8/17): Free T4 and T3 normal, TSH mildly elevated, K 4.2, creatinine 1.7, LFTs normal, hgb 10.4 Labs (12/17): TSH elevated but free T3 and T4 normal, K 4.5, creatinine 1.9, LDL 45, HDL 39, LFTs normal, HCT 32.9 Labs (1/18): K 4.4, creatinine 1.93 Labs (3/18): K 4.3, creatinine 1.87, BNP 187, hgb 11.1, LDL 75, LFTs normal Labs (4/18): K 4.1, creatinine 1.7 Labs (9/18): K 4, creatinine 1.8, hgb 11.9 Labs (10/18): free T4 0.4, free T3 1.9, TSH 139   Labs (12/18): K 3.4, creatinine 1.76, hgb 12.2 Labs (1/19): K 4 => 4.9, creatinine 2.7 => 2.97, TSH 179, LFTs normal Labs (2/19): K 4.6, creatinine 2.19  PMH: 1. CKD stage 3 2. HTN 3. Hyperlipidemia 4. AAA: s/p surgery with left renal artery bypass in 1997 - Abdominal US 10/18 with 3.2 AAA 5. GERD 6. IBS 7. Melanoma s/p reception 8. Diabetes: Diet-controlled 9. Carotid stenosis: Carotid dopplers (4/15) with 40-59% bilateral stenosis. Carotid dopplers (7/16) with < 40% BICA stenosis.  Carotid dopplers (10/16) with 40-59% BICA stenosis.  - Carotids (8/17) with minimal disease.  - Carotids (10/18) with 1-39% BICA stenosis.  - Carotids (1/19): 1-39% BICA stenosis 10. PAD: 9/14 ABIs 0.91 right 1.09 left.  7/16 ABIs 0.86 right, 1.1 left; aortoiliac duplex with < 50% bilateral iliac stenosis.  - Aortoiliac duplex (8/17) with < 50% bilateral iliac stenosis.   - ABIs (10/17): left 0.91, right 0.98, left TBI 0.44 (abnormal).  - ABIs (7/18): right 0.97, right 0.75 - ABIs (1/19): right 1.04, left 1.03 11. CAD: Has super-dominant RCA. s/p CABG 1992.  He had Taxus DES to mid PDA in 5/02. LHC (1/05) with 90% ostial PDA (had DES to this vessel), totally occluded RIMA-PDA, totally occluded PLV, sequential SVG-PLV with 1 branch occluded.  LHC (11/15) with left main off the right cusp, 40-50% ostial left main, total occlusion of the proximal LAD (small vessel) with right to left collaterals, large super-dominant RCA with 50% ISR mid RCA, 50% ostial PDA, patent SVG-PLV, RIMA-PDA known atretic. Lexiscan Cardiolite (6/16) with EF 42%, prior anterolateral MI, no ischemia.  - Lexiscan Cardiolite (3/18): EF 51%, basal to mid anterolateral fixed defect with no ischemia.  12. Ischemic cardiomyopathy: Echo (12/09) with EF 45-50%, basal to mid inferolateral hypokinesis. Echo (6/15) with EF 45-50%, akinesis of the mid to apical inferolateral wall.  Echo (6/16) with EF 50-55%, mild LVH, inferior hypokinesis, mild MR.   Echo (10/16) with EF 40-45%, moderate LVH, mildly decreased RV systolic function.  - Echo (3/18) with EF 40-45%, mid anterior hypokinesis.  13. OA 14. Atrial fibrillation/flutter: Paroxysmal.  Noted during 5/16 hospitalization in [place] and in 6/16.  He developed atrial flutter in 8/16, back in NSR by 9/16.  15. Emphysema: PFTs (8/16) with FVC 88%, FEV1 74%, ratio 81%, TLC 83%, DLCO 31%. - PFTs (12/18): FVC 64%, FEV1 69%, ratio 105%, TLC 82%, DLCO 33% => mild obstruction, severely decreased DLCO (similar to prior).   16. Renal cysts 17. Idiopathic peripheral neuropathy.  18. Anemia.  49. 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  20. Hernia repair (11/18) 21. Hypothyroidism 22. Bronchiectasis: CT chest 1/19 with bronchiectasis.  23. Lung nodule: 1.3 cm RML  nodule on 1/19 CT, was noted in 2014 => slow growing.   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  FH: Brother with CAD.   ROS: All systems reviewed and negative except as per HPI.   Current Outpatient Medications  Medication Sig Dispense Refill  . ACCU-CHEK AVIVA PLUS test strip CHECK BLOOD SUGAR 4 TIMES A DAY. 100 each 11  . acetaminophen (TYLENOL) 500 MG tablet Take 1,000 mg by mouth every 8 (eight) hours as needed for moderate pain.     Marland Kitchen ALPRAZolam (XANAX) 0.5 MG tablet TAKE 1/2 TABLET TWICE DAILY AS NEEDED FOR ANXIETY. (Patient taking differently: Take 0.25 mg by mouth twice daily as needed for anxiety) 60 tablet 5  . amiodarone (PACERONE) 100 MG tablet Take 1 tablet (100 mg total) by mouth daily. 30 tablet 3  . atorvastatin (LIPITOR) 80 MG tablet TAKE 1 TABLET ONCE DAILY. 90 tablet 3  . carvedilol (COREG) 12.5 MG tablet TAKE 1 TABLET TWICE DAILY. 60 tablet 3  . cilostazol (PLETAL) 100 MG tablet TAKE 1 TABLET TWICE DAILY. 180 tablet 3  . ELIQUIS 2.5 MG TABS tablet TAKE 1 TABLET TWICE DAILY. 60 tablet 3  . ezetimibe (ZETIA) 10 MG tablet TAKE (1) TABLET DAILY. 90 tablet 3  . FLUoxetine  (PROZAC) 20 MG capsule TAKE (1) CAPSULE DAILY. 90 capsule 0  . furosemide (LASIX) 80 MG tablet Take 120mg  (1.5 tablets)  in the AM and 80mg (1 tablet)  in the PM 75 tablet 6  . HYDROcodone-acetaminophen (NORCO/VICODIN) 5-325 MG tablet Take 1 tablet by mouth every 4 (four) hours as needed for moderate pain. 24 tablet 0  . levothyroxine (SYNTHROID, LEVOTHROID) 75 MCG tablet Take 1 tablet (75 mcg total) by mouth daily before breakfast. 30 tablet 3  . Multiple Vitamin (MULTIVITAMIN) tablet Take 1 tablet by mouth daily.    Marland Kitchen NITROSTAT 0.4 MG SL tablet DISSOLVE 1 TABLET UNDER TONGUE AS NEEDED FOR CHEST PAIN,MAY REPEAT IN5 MINUTES FOR 2 DOSES. 25 tablet 6  . potassium chloride 20 MEQ TBCR Take 20 mEq by mouth daily. 30 tablet 3  . temazepam (RESTORIL) 30 MG capsule TAKE 1 CAPSULE AT BEDTIME AS NEEDED FOR SLEEP. 30 capsule 3  . tiotropium (SPIRIVA HANDIHALER) 18 MCG inhalation capsule Place 1 capsule (18 mcg total) into inhaler and inhale daily. 30 capsule 12  . traMADol (ULTRAM) 50 MG tablet Take 1 tablet (50 mg total) by mouth every 6 (six) hours as needed. 12 tablet 0  . hydrALAZINE (APRESOLINE) 25 MG tablet Take 1 tablet (25 mg total) by mouth 3 (three) times daily. D/c order for Losartan. 90 tablet 3  . Investigational - Study Medication Take 1 tablet by mouth daily. Study name: [place]  Additional study details: [id number] (vericiguat) or Placebo 1 each PRN   No current facility-administered medications for this encounter.    BP (!) 100/54   Pulse 81   Wt 201 lb 4 oz (91.3 kg)   SpO2 91%   BMI 31.52 kg/m   General: NAD Neck: JVP 8-9 cm, no thyromegaly or thyroid nodule.  Lungs: Rhonchi noted.  CV: Nondisplaced PMI.  Heart regular S1/S2, no S3/S4, no murmur.  2+ edema to knees bilaterally.  No carotid bruit.  Unable to palpate pedal pulses.  Abdomen: Soft, nontender, no hepatosplenomegaly, no distention.  Skin: Intact without lesions or rashes.  Neurologic: Alert and oriented x 3.  Psych:  Normal affect. Extremities: No clubbing or cyanosis.  HEENT: Normal.  Assessment/Plan:  1. CAD: s/p CABG.  Cath in 11/15 showed occluded LAD with left to right collaterals.  The LAD was a relatively small vessel (super-dominant right).  Managed medically. Lexiscan Cardiolite in 6/16 with infarction but no ischemia.  Cardiolite 3/18 with infarction, no ischemia. No chest pain.  - Continue ASA 81.   - Continue statin. - Continue Coreg.   2. Ischemic cardiomyopathy with systolic CHF: EF 71-69% on 3/18 echo.  NYHA class III symptoms, fairly stable. He is volume overloaded on exam with marked lower extremity edema.  - He will stop losartan completely with recent rise in creatinine.  He will instead use hydralazine 25 mg tid in addition to [place] study drug (vericiguat versus placebo).  - Continue Coreg 12.5 mg bid.    - Increase Lasix to 120 mg bid x 4 days, then back to 120 qam/80 qpm.  BMET today and again in 2 wks.  - I will have him wear graded compression stockings during the day.  - Repeat echo.  3. Carotid stenosis: Stable, followed at [facility].  4. PAD: Stable claudication, ABIs with some improvement in 1/19.  No pedal ulcers.  Seen by [facility], plan for medical management.  - Continue cilostazol, unable to come off due to worsening claudication when he stops it.  5. Hyperlipidemia: Good lipids in 3/18.  6. CKD III: BMET done today, creatinine up to 2.97 in 1/19, down to 2.19 most recently.  - He will stop losartan as above. 7. Atrial fibrillation/flutter:  Paroxysmal, NSR today by exam.   - Continue amiodarone 100 mg daily. Will need to get regular eye exams with amiodarone use.  Normal LFTs in 1/19.  High resolution CT in 1/19 did not appear to show evidence for amiodarone lung toxicity.  - Continue Eliquis 2.5 bid.   8. Suspected OSA: He cancelled his sleep study due to mask discomfort 9. COPD: He is now back off oxygen.  - Based on PFT abnormality with mild obstruction but severely  decreased DLCO.  1/19 high resolution CT chest showed bronchiectasis. He has appointment with Dr. Lake Bells scheduled.  10. CVA: Cerebellar, 10/16.  He has had some resulting imbalance.  He is no longer driving.  11. Hypertension: He will stop losartan and start hydralazine, as above.  12. Hypothyroidism: Likely related to amiodarone. I started him on levothyroxine and recently increased it.  TSH was still very high last checked.  - I will defer to PCP for further management.  13. Pulmonary nodule: 1.3 cm RML nodule on 1/19 CT chest.  It was also seen in 2014 => slow growing, think repeat CT 1 year is most appropriate.  He will see pulmonary soon and will get their opinion.      Followup 3 wks with NP/PA.   Loralie Champagne 02/05/2018

## 2018-02-05 NOTE — Telephone Encounter (Signed)
Prescription for IV diuretic protocol faxed to [facility] @ [phone].    RN is aware.

## 2018-02-05 NOTE — Telephone Encounter (Addendum)
RESEARCH ENCOUNTER  Patient ID: Andrew [name]  DOB: June 13, 1928  Confirmed with PI Dr. [name] that patient should not be taking Imdur while in the [facility].  Verbal orders given to D/C Imdur.  Spoke with patients wife on the phone about the Imdur prescription written today.  Andrew [name] confirmed that Andrew [name] has not taken Imdur, but it was filled at the pharmacy today.  Wife verbalized understanding that Andrew [name] will not take the prescribed Imdur, will continue the [place] study medication, and will store the Imdur bottle.

## 2018-02-06 ENCOUNTER — Other Ambulatory Visit (HOSPITAL_COMMUNITY): Payer: Self-pay | Admitting: Cardiology

## 2018-02-06 NOTE — Progress Notes (Signed)
Notes recorded by Harvie Junior, CMA on 01/24/2018 at 10:47 AM EST Pt and wife aware of results and med change. Labs forwarded to PCP.

## 2018-02-07 ENCOUNTER — Telehealth: Payer: Self-pay | Admitting: Family Medicine

## 2018-02-07 NOTE — Telephone Encounter (Signed)
Copied from [place]. Topic: Inquiry >> Feb 07, 2018  5:04 PM Andrew [name] wrote: Reason for CRM: [facility] called to get clarification on Rx's Lipitor and Zetia, as well on how often the pt should check his blood sugar, contact [facility] to advise

## 2018-02-08 ENCOUNTER — Telehealth (HOSPITAL_COMMUNITY): Payer: Self-pay

## 2018-02-08 ENCOUNTER — Ambulatory Visit: Payer: Medicare Other | Admitting: Family Medicine

## 2018-02-08 ENCOUNTER — Telehealth: Payer: Self-pay | Admitting: Family Medicine

## 2018-02-08 ENCOUNTER — Encounter: Payer: Self-pay | Admitting: Family Medicine

## 2018-02-08 VITALS — BP 132/78 | HR 80 | Temp 97.6°F | Ht 67.0 in | Wt 201.2 lb

## 2018-02-08 DIAGNOSIS — E039 Hypothyroidism, unspecified: Secondary | ICD-10-CM | POA: Diagnosis not present

## 2018-02-08 LAB — TSH: TSH: 139.28 u[IU]/mL — AB (ref 0.35–4.50)

## 2018-02-08 NOTE — Progress Notes (Signed)
    Subjective:  Andrew [name] is a 84 y.o. male who presents today with a chief complaint of Hypothyroidism.   HPI:  Hypothyroidism, established problem, uncontrolled Patient seen about 2 months ago for this.  At that time he was only recently started on levothyroxine 25 mcg daily.  Patient was instructed to follow-up 4 weeks after that visit however was unable to do so.  He saw his cardiologist 2 weeks ago who checked his TSH and found that it was still significantly elevated.  At that time, his levothyroxine dose was increased to 75 mg daily.  He has been compliant with that dose of medication.  States that he takes it every morning while on empty stomach.  Denies any missed doses.  Has not noticed any difference in his symptoms since being on the increased dose.  No palpitations.  No chest pain or shortness of breath.  No hair or skin changes.  ROS: Per HPI  Objective:  Physical Exam: BP 132/78 (BP Location: Left Arm, Patient Position: Sitting, Cuff Size: Normal)   Pulse 80   Temp 97.6 F (36.4 C) (Oral)   Ht 5\' 7"  (1.702 m)   Wt 201 lb 3.2 oz (91.3 kg)   SpO2 94%   BMI 31.51 kg/m   Gen: NAD, resting comfortably HEENT: No appreciable thyromegaly. CV: Regular rate.  No murmurs. Pulm: Normal work of breathing.  Diffuse rhonchi.  Speaks in full sentences. Extremities: 2+ pitting edema to knees bilaterally.  A few areas of skin breakdown without surrounding erythema or purulence.  Assessment/Plan:  Hypothyroidism Check TSH today, though would doubt significant improvement considering he is only been on increased dose for about 2 weeks.  For the meantime, we will continue levothyroxine 75 mcg daily.  He will follow-up in 4 weeks to recheck TSH and will adjust his medication accordingly.  Discussed reasons to return to care sooner.   Algis Greenhouse. Jerline Pain, MD 02/08/2018 3:17 PM

## 2018-02-08 NOTE — Telephone Encounter (Signed)
Contacted pt regarding appointment and new referral-he had a hard time hearing so he wanted me to talk to his wife-she is very excited about help with his meds as this is very over whelming for her. I plan to go out on Monday.   02/08/18 Marylouise Stacks, [employer]

## 2018-02-08 NOTE — Patient Instructions (Signed)
No changes today.  Please come back in 4 weeks for a lab visit to recheck.  Take care, Dr Jerline Pain

## 2018-02-08 NOTE — Assessment & Plan Note (Signed)
Check TSH today, though would doubt significant improvement considering he is only been on increased dose for about 2 weeks.  For the meantime, we will continue levothyroxine 75 mcg daily.  He will follow-up in 4 weeks to recheck TSH and will adjust his medication accordingly.  Discussed reasons to return to care sooner.

## 2018-02-08 NOTE — Telephone Encounter (Signed)
Copied from [place]. Topic: Inquiry >> Feb 07, 2018  5:04 PM Oliver Pila B wrote: Reason for CRM: [facility] called to get clarification on Rx's Lipitor and Zetia, as well on how often the pt should check his blood sugar, contact [facility] to advise

## 2018-02-08 NOTE — Telephone Encounter (Signed)
Please contact the wife to advise.   Copied from [place] [id number]. Topic: Inquiry >> Feb 08, 2018 12:50 PM Oliver Pila B wrote: Reason for CRM: wife of pt called to make sure the phone call she missed from the office was about if Dr. Jerline Pain has the medical records of the pt's thyroid, contact pt asap

## 2018-02-08 NOTE — Telephone Encounter (Signed)
Patient was seen in the office today

## 2018-02-12 ENCOUNTER — Other Ambulatory Visit (HOSPITAL_COMMUNITY): Payer: Self-pay

## 2018-02-12 ENCOUNTER — Other Ambulatory Visit (HOSPITAL_COMMUNITY): Payer: Self-pay | Admitting: Cardiology

## 2018-02-12 ENCOUNTER — Telehealth: Payer: Self-pay | Admitting: Family Medicine

## 2018-02-12 ENCOUNTER — Other Ambulatory Visit: Payer: Self-pay | Admitting: Internal Medicine

## 2018-02-12 NOTE — Telephone Encounter (Signed)
[facility] called - please call back at [phone]

## 2018-02-12 NOTE — Telephone Encounter (Signed)
Copied from [place] [id number]. Topic: Quick Communication - See Telephone Encounter >> Feb 12, 2018  2:50 PM Arletha Grippe wrote: CRM for notification. See Telephone encounter for:   02/12/18. [facility] called - they state that pt fell on Friday getting into/ out of car. Pt hit his forehead, has bruising ariound eye, and hit his knee. EMS was called, but pt refused to go the hospital. Cb is [phone]

## 2018-02-12 NOTE — Telephone Encounter (Signed)
See note

## 2018-02-12 NOTE — Progress Notes (Signed)
Paramedicine Encounter    Patient ID: Andrew [name], male    DOB: [birth year], 84 y.o.   MRN: [id number]   Patient Care Team: [name], MD as PCP - General (Family Medicine) Larey Dresser, MD as Consulting Physician (Cardiology)  Patient Active Problem List   Diagnosis Date Noted  . Bilateral recurrent inguinal hernias 11/28/2017  . Calf pain 05/25/2016  . Carotid artery stenosis 01/12/2016  . Chronic systolic CHF (congestive heart failure) ([place]) 12/02/2015  . Cerebellar infarct ([place]) 10/16/2015  . 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 ([place]) 10/16/2015  . PVD (peripheral vascular disease) ([name])   . Hereditary and idiopathic peripheral neuropathy 10/02/2015  . Peripelvic (lymphatic) cyst   . Pernicious anemia 07/15/2015  . Bilateral renal cysts 07/12/2015  . Lung nodule, 69mm RML CT 07/12/15 07/12/2015  . Constipation 07/12/2015  . Kidney stone on right side 07/12/2015  . Stage 3 chronic kidney disease ([place]) 06/14/2015  . Chronic diastolic CHF (congestive heart failure) ([place]) 05/28/2015  . Paroxysmal atrial fibrillation ([place]) 05/28/2015  . AAA (abdominal aortic aneurysm) without rupture ([place]) 09/30/2014  . Left-sided low back pain without sciatica 07/21/2014  . Cardiomyopathy, ischemic 05/23/2014  . DM (diabetes mellitus), type 2 with renal complications ([employer]) 47/08/6282  . CORONARY ATHEROSCLEROSIS NATIVE CORONARY ARTERY 11/09/2010  . Depression, major, single episode, complete remission ([place]) 03/18/2010  . Hypothyroidism 02/03/2009  . Hyperlipidemia associated with type 2 diabetes mellitus ([place]) 06/14/2007  . Hypertension associated with diabetes ([place]) 06/14/2007  . GERD 06/14/2007    Current Outpatient Medications:  .  ACCU-CHEK AVIVA PLUS test strip, CHECK BLOOD SUGAR 4 TIMES A DAY., Disp: 100 each, Rfl: 11 .  acetaminophen (TYLENOL) 500 MG tablet, Take 1,000 mg by mouth every 8 (eight) hours as needed for moderate pain. , Disp: , Rfl:  .  ALPRAZolam  (XANAX) 0.5 MG tablet, TAKE 1/2 TABLET TWICE DAILY AS NEEDED FOR ANXIETY. (Patient taking differently: Take 0.25 mg by mouth twice daily as needed for anxiety), Disp: 60 tablet, Rfl: 5 .  amiodarone (PACERONE) 100 MG tablet, Take 1 tablet (100 mg total) by mouth daily., Disp: 30 tablet, Rfl: 3 .  amoxicillin (AMOXIL) 875 MG tablet, Take 875 mg by mouth 2 (two) times daily., Disp: , Rfl:  .  atorvastatin (LIPITOR) 80 MG tablet, TAKE 1 TABLET ONCE DAILY., Disp: 90 tablet, Rfl: 3 .  carvedilol (COREG) 12.5 MG tablet, TAKE 1 TABLET TWICE DAILY., Disp: 60 tablet, Rfl: 3 .  cilostazol (PLETAL) 100 MG tablet, TAKE 1 TABLET TWICE DAILY., Disp: 180 tablet, Rfl: 3 .  ELIQUIS 2.5 MG TABS tablet, TAKE 1 TABLET BY MOUTH TWICE DAILY., Disp: 60 tablet, Rfl: 11 .  FLUoxetine (PROZAC) 20 MG capsule, TAKE (1) CAPSULE DAILY., Disp: 90 capsule, Rfl: 0 .  HYDROcodone-acetaminophen (NORCO/VICODIN) 5-325 MG tablet, Take 1 tablet by mouth every 4 (four) hours as needed for moderate pain., Disp: 24 tablet, Rfl: 0 .  Investigational - Study Medication, Take 1 tablet by mouth daily. Study name: [place]  Additional study details: MK-1242 (vericiguat) or Placebo, Disp: 1 each, Rfl: PRN .  levothyroxine (SYNTHROID, LEVOTHROID) 75 MCG tablet, Take 1 tablet (75 mcg total) by mouth daily before breakfast., Disp: 30 tablet, Rfl: 3 .  Multiple Vitamin (MULTIVITAMIN) tablet, Take 1 tablet by mouth daily., Disp: , Rfl:  .  NITROSTAT 0.4 MG SL tablet, DISSOLVE 1 TABLET UNDER TONGUE AS NEEDED FOR CHEST PAIN,MAY REPEAT IN5 MINUTES FOR 2 DOSES., Disp: 25 tablet, Rfl: 6 .  potassium chloride 20 MEQ TBCR, Take 20 mEq by mouth daily., Disp: 30 tablet, Rfl: 3 .  temazepam (RESTORIL) 30 MG capsule, TAKE 1 CAPSULE AT BEDTIME AS NEEDED FOR SLEEP., Disp: 30 capsule, Rfl: 3 .  tiotropium (SPIRIVA HANDIHALER) 18 MCG inhalation capsule, Place 1 capsule (18 mcg total) into inhaler and inhale daily., Disp: 30 capsule, Rfl: 12 .  ezetimibe (ZETIA) 10 MG  tablet, TAKE (1) TABLET DAILY., Disp: 90 tablet, Rfl: 3 .  furosemide (LASIX) 80 MG tablet, Take 120mg  (1.5 tablets)  in the AM and 80mg (1 tablet)  in the PM, Disp: 75 tablet, Rfl: 6 .  hydrALAZINE (APRESOLINE) 25 MG tablet, Take 1 tablet (25 mg total) by mouth 3 (three) times daily. D/c order for Losartan., Disp: 90 tablet, Rfl: 3 .  traMADol (ULTRAM) 50 MG tablet, Take 1 tablet (50 mg total) by mouth every 6 (six) hours as needed. (Patient not taking: Reported on 02/12/2018), Disp: 12 tablet, Rfl: 0 Allergies  Allergen Reactions  . Metoprolol Shortness Of Breath, Palpitations and Other (See Comments)    Heart starts racing. Shallow breathing   . Metformin And Related Nausea And Vomiting      Social History   Socioeconomic History  . Marital status: Married    Spouse name: Not on file  . Number of children: Not on file  . Years of education: Not on file  . Highest education level: Not on file  Social Needs  . Financial resource strain: Not on file  . Food insecurity - worry: Not on file  . Food insecurity - inability: Not on file  . Transportation needs - medical: Not on file  . Transportation needs - non-medical: Not on file  Occupational History  . Occupation: Retired  Tobacco Use  . Smoking status: Former Smoker    Packs/day: 1.50    Years: 35.00    Pack years: 52.50    Types: Cigarettes  . Smokeless tobacco: Never Used  . Tobacco comment: quit atleast 25 yrs ago, per pt  Substance and Sexual Activity  . Alcohol use: Yes    Alcohol/week: 1.8 oz    Types: 3 Shots of liquor per week    Comment: socially  . Drug use: No  . Sexual activity: Yes  Other Topics Concern  . Not on file  Social History Narrative   Pt lives in [place] with spouse.   Retired from [employer].  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    Physical Exam      Future Appointments  Date Time Provider [place]  02/15/2018  2:30 PM  Gardiner Barefoot, DPM [facility]  02/19/2018  1:00 PM [place] [facility] 1-BUZZ [facility]  02/19/2018  2:00 PM [facility] LAB [facility] None  02/20/2018  2:20 PM Larey Dresser, MD [facility] None  02/22/2018  3:00 PM Juanito Doom, MD LBPU-PULCARE None  02/23/2018  8:30 AM Gardiner Barefoot, DPM [facility]  02/26/2018  1:30 PM [facility] PA/NP [facility] None  03/12/2018  1:40 PM [name], MD [facility] P[facility]  04/10/2018  3:00 PM Gardiner Barefoot, DPM [facility]  04/11/2018 11:00 AM [place] 1 LBRE-CVRES None  07/06/2018  2:00 PM MC-CV HS VASC 4 [facility]I [facility]  07/06/2018  2:45 PM Nickel, Sharmon Leyden, NP [facility]    BP (!) 150/88   Pulse 82   Resp 15   Wt 198 lb (89.8 kg)   SpO2 92%   BMI 31.01 kg/m  Weight yesterday-195   Pt is a new referral to [facility] program, pt does have advanced home health nurse weekly, no longer has PT, he had a fall last Friday where he tripped and he struck his head on pavement, EMS was called to assist-he refused txp to hosp at that time- he is noted to be on blood thinner therapy-he has bruising to forehead and bruising under both eyes-he did not have LOC. He denies any h/a post fall, no dizziness either.  He lives at home with wife. He is not using pill box at this time but has one he would like for me to fill for him.  He uses a cane and usually keeps it close by. Uses it more frequently when he is out.  He weighs daily. He has telemonitoring Equipment with [facility].  Pt states sometimes he misses the evening doses of meds. Always takes the mornings.  He states he tries to limit the high sodium foods, he states he uses salt substitute and I cautioned him that it contains potassium so use sparingly.  He goes to pulmologist soon.  He states he sleeps at night, he sleeps in recliner chair that can about lay flat.  He has his wife to take him to all appointments-no issues with transportation.  [facility].  Wife picks meds  up.  He is out of his eliquis for 2 days. He is out of his atorvastatin as well, out of zetia-this was called in to pharmacy. Wife states she will get it tomor afternoon and will call me once she is home and I will go back out to place in pill box.   Pillbox filled. Explained the use of it to him and wife and he seemed to understand. He states the mid dose meds he may have trouble remembering as that is new for him so I just encouraged him to try to remember, set an alarm to help remind him.   Marylouise Stacks, [facility] Paramedic  02/13/18

## 2018-02-12 NOTE — Telephone Encounter (Signed)
L/m at contact number to call office. CRM Started ok to give message.

## 2018-02-13 ENCOUNTER — Other Ambulatory Visit: Payer: Self-pay | Admitting: Internal Medicine

## 2018-02-13 ENCOUNTER — Other Ambulatory Visit (HOSPITAL_COMMUNITY): Payer: Self-pay

## 2018-02-13 ENCOUNTER — Other Ambulatory Visit (HOSPITAL_COMMUNITY): Payer: Self-pay | Admitting: *Deleted

## 2018-02-13 DIAGNOSIS — E7800 Pure hypercholesterolemia, unspecified: Secondary | ICD-10-CM

## 2018-02-13 NOTE — Telephone Encounter (Signed)
Again LM on voicemail.  CRM created.  Ok to [employer].

## 2018-02-13 NOTE — Progress Notes (Signed)
Came by today for med rec--wife went to get meds from pharmacy today that we called in yesterday. Placed the eliquis and zetia in pill box. He did not have the atorvastatin bottle yesterday-unknown how long he has not been taking it--he has lab work on monday and then clinic visit Tuesday and I will meet them there at clinic visit. Lab work will check atorvastatin and at that point it will be determined if he needs both zetia and atorvastatin.   Marylouise Stacks, EMT-Paramedic 02/13/18

## 2018-02-13 NOTE — Telephone Encounter (Signed)
I have reached out to the patient and have not received a call back.

## 2018-02-14 NOTE — Telephone Encounter (Signed)
LM for patient to return call.

## 2018-02-15 ENCOUNTER — Ambulatory Visit: Payer: Medicare Other | Admitting: Podiatry

## 2018-02-16 NOTE — Telephone Encounter (Signed)
LM again to return call.  Closing this encounter.  Multiple messages have been left on voicemail for Andrew [name] at [facility] with no return call.  Will address issues if/when Andrew [name] returns call.

## 2018-02-19 ENCOUNTER — Ambulatory Visit (HOSPITAL_BASED_OUTPATIENT_CLINIC_OR_DEPARTMENT_OTHER)
Admission: RE | Admit: 2018-02-19 | Discharge: 2018-02-19 | Disposition: A | Discharge status: Home / Self Care | Payer: Medicare Other | Source: Ambulatory Visit

## 2018-02-19 ENCOUNTER — Ambulatory Visit (HOSPITAL_COMMUNITY)
Admission: RE | Admit: 2018-02-19 | Discharge: 2018-02-19 | Disposition: A | Discharge status: Home / Self Care | Payer: Medicare Other | Source: Ambulatory Visit | Attending: Cardiology | Admitting: Cardiology

## 2018-02-19 ENCOUNTER — Other Ambulatory Visit: Payer: Self-pay

## 2018-02-19 DIAGNOSIS — I5032 Chronic diastolic (congestive) heart failure: Secondary | ICD-10-CM | POA: Insufficient documentation

## 2018-02-19 DIAGNOSIS — E119 Type 2 diabetes mellitus without complications: Secondary | ICD-10-CM | POA: Diagnosis not present

## 2018-02-19 DIAGNOSIS — I4891 Unspecified atrial fibrillation: Secondary | ICD-10-CM | POA: Insufficient documentation

## 2018-02-19 DIAGNOSIS — E785 Hyperlipidemia, unspecified: Secondary | ICD-10-CM | POA: Diagnosis not present

## 2018-02-19 DIAGNOSIS — Z951 Presence of aortocoronary bypass graft: Secondary | ICD-10-CM | POA: Insufficient documentation

## 2018-02-19 DIAGNOSIS — I2510 Atherosclerotic heart disease of native coronary artery without angina pectoris: Secondary | ICD-10-CM | POA: Insufficient documentation

## 2018-02-19 DIAGNOSIS — E7800 Pure hypercholesterolemia, unspecified: Secondary | ICD-10-CM

## 2018-02-19 DIAGNOSIS — I110 Hypertensive heart disease with heart failure: Secondary | ICD-10-CM | POA: Insufficient documentation

## 2018-02-19 LAB — BASIC METABOLIC PANEL
ANION GAP: 12 (ref 5–15)
BUN: 40 mg/dL — ABNORMAL HIGH (ref 6–20)
CALCIUM: 8.9 mg/dL (ref 8.9–10.3)
CO2: 27 mmol/L (ref 22–32)
Chloride: 100 mmol/L — ABNORMAL LOW (ref 101–111)
Creatinine, Ser: 1.86 mg/dL — ABNORMAL HIGH (ref 0.61–1.24)
GFR calc non Af Amer: 31 mL/min — ABNORMAL LOW (ref >60)
GFR, EST AFRICAN AMERICAN: 36 mL/min — AB (ref >60)
Glucose, Bld: 131 mg/dL — ABNORMAL HIGH (ref 65–99)
Potassium: 4.9 mmol/L (ref 3.5–5.1)
Sodium: 139 mmol/L (ref 135–145)

## 2018-02-19 LAB — LIPID PANEL
CHOLESTEROL: 251 mg/dL — AB (ref 0–200)
HDL: 38 mg/dL — ABNORMAL LOW (ref >40)
LDL CALC: 159 mg/dL — AB (ref 0–99)
Total CHOL/HDL Ratio: 6.6 RATIO
Triglycerides: 270 mg/dL — ABNORMAL HIGH (ref <150)
VLDL: 54 mg/dL — AB (ref 0–40)

## 2018-02-19 MED ORDER — LANCETS MISC: 11 refills | Status: DC

## 2018-02-19 MED ORDER — GLUCOSE BLOOD VI STRP: ORAL_STRIP | 11 refills | Status: DC

## 2018-02-19 NOTE — Progress Notes (Signed)
  Echocardiogram 2D Echocardiogram has been performed.  Andrew [name] 02/19/2018, 2:08 PM

## 2018-02-20 ENCOUNTER — Ambulatory Visit (HOSPITAL_COMMUNITY)
Admission: RE | Admit: 2018-02-20 | Discharge: 2018-02-20 | Disposition: A | Discharge status: Home / Self Care | Payer: Medicare Other | Source: Ambulatory Visit | Attending: Cardiology | Admitting: Cardiology

## 2018-02-20 ENCOUNTER — Other Ambulatory Visit (HOSPITAL_COMMUNITY): Payer: Self-pay

## 2018-02-20 ENCOUNTER — Encounter (HOSPITAL_COMMUNITY): Payer: Self-pay | Admitting: Cardiology

## 2018-02-20 ENCOUNTER — Other Ambulatory Visit: Payer: Self-pay

## 2018-02-20 VITALS — BP 119/52 | HR 80 | Wt 197.5 lb

## 2018-02-20 DIAGNOSIS — I779 Disorder of arteries and arterioles, unspecified: Secondary | ICD-10-CM

## 2018-02-20 DIAGNOSIS — E039 Hypothyroidism, unspecified: Secondary | ICD-10-CM | POA: Diagnosis not present

## 2018-02-20 DIAGNOSIS — I130 Hypertensive heart and chronic kidney disease with heart failure and stage 1 through stage 4 chronic kidney disease, or unspecified chronic kidney disease: Secondary | ICD-10-CM | POA: Insufficient documentation

## 2018-02-20 DIAGNOSIS — Z7901 Long term (current) use of anticoagulants: Secondary | ICD-10-CM | POA: Diagnosis not present

## 2018-02-20 DIAGNOSIS — I5030 Unspecified diastolic (congestive) heart failure: Secondary | ICD-10-CM | POA: Insufficient documentation

## 2018-02-20 DIAGNOSIS — Z8673 Personal history of transient ischemic attack (TIA), and cerebral infarction without residual deficits: Secondary | ICD-10-CM | POA: Diagnosis not present

## 2018-02-20 DIAGNOSIS — N183 Chronic kidney disease, stage 3 (moderate): Secondary | ICD-10-CM | POA: Insufficient documentation

## 2018-02-20 DIAGNOSIS — I739 Peripheral vascular disease, unspecified: Secondary | ICD-10-CM | POA: Diagnosis not present

## 2018-02-20 DIAGNOSIS — I4892 Unspecified atrial flutter: Secondary | ICD-10-CM | POA: Insufficient documentation

## 2018-02-20 DIAGNOSIS — E1169 Type 2 diabetes mellitus with other specified complication: Secondary | ICD-10-CM | POA: Diagnosis not present

## 2018-02-20 DIAGNOSIS — Z951 Presence of aortocoronary bypass graft: Secondary | ICD-10-CM | POA: Insufficient documentation

## 2018-02-20 DIAGNOSIS — I252 Old myocardial infarction: Secondary | ICD-10-CM | POA: Insufficient documentation

## 2018-02-20 DIAGNOSIS — E1122 Type 2 diabetes mellitus with diabetic chronic kidney disease: Secondary | ICD-10-CM | POA: Insufficient documentation

## 2018-02-20 DIAGNOSIS — R911 Solitary pulmonary nodule: Secondary | ICD-10-CM | POA: Diagnosis not present

## 2018-02-20 DIAGNOSIS — Z85820 Personal history of malignant melanoma of skin: Secondary | ICD-10-CM | POA: Insufficient documentation

## 2018-02-20 DIAGNOSIS — Z87891 Personal history of nicotine dependence: Secondary | ICD-10-CM | POA: Insufficient documentation

## 2018-02-20 DIAGNOSIS — E785 Hyperlipidemia, unspecified: Secondary | ICD-10-CM | POA: Diagnosis not present

## 2018-02-20 DIAGNOSIS — E1151 Type 2 diabetes mellitus with diabetic peripheral angiopathy without gangrene: Secondary | ICD-10-CM | POA: Insufficient documentation

## 2018-02-20 DIAGNOSIS — K589 Irritable bowel syndrome without diarrhea: Secondary | ICD-10-CM | POA: Insufficient documentation

## 2018-02-20 DIAGNOSIS — K219 Gastro-esophageal reflux disease without esophagitis: Secondary | ICD-10-CM | POA: Diagnosis not present

## 2018-02-20 DIAGNOSIS — J439 Emphysema, unspecified: Secondary | ICD-10-CM | POA: Diagnosis not present

## 2018-02-20 DIAGNOSIS — I255 Ischemic cardiomyopathy: Secondary | ICD-10-CM | POA: Diagnosis not present

## 2018-02-20 DIAGNOSIS — I5032 Chronic diastolic (congestive) heart failure: Secondary | ICD-10-CM

## 2018-02-20 DIAGNOSIS — I2510 Atherosclerotic heart disease of native coronary artery without angina pectoris: Secondary | ICD-10-CM | POA: Insufficient documentation

## 2018-02-20 DIAGNOSIS — J479 Bronchiectasis, uncomplicated: Secondary | ICD-10-CM | POA: Insufficient documentation

## 2018-02-20 DIAGNOSIS — I480 Paroxysmal atrial fibrillation: Secondary | ICD-10-CM | POA: Diagnosis not present

## 2018-02-20 DIAGNOSIS — I4430 Unspecified atrioventricular block: Secondary | ICD-10-CM | POA: Insufficient documentation

## 2018-02-20 DIAGNOSIS — I6529 Occlusion and stenosis of unspecified carotid artery: Secondary | ICD-10-CM | POA: Insufficient documentation

## 2018-02-20 DIAGNOSIS — Z79899 Other long term (current) drug therapy: Secondary | ICD-10-CM | POA: Insufficient documentation

## 2018-02-20 MED ORDER — ATORVASTATIN CALCIUM 80 MG PO TABS: 80.0000 mg | ORAL_TABLET | Freq: Every day | ORAL | 3 refills | Status: DC

## 2018-02-20 NOTE — Patient Instructions (Signed)
Start Atorvastatin 80 mg (1 tab) daily  Your physician recommends that you return for lab work in: 2 months   Your physician recommends that you schedule a follow-up appointment in: 6 weeks with Dr. [name]

## 2018-02-20 NOTE — Progress Notes (Signed)
Paramedicine Encounter   Patient ID: LISTON THUM , male,   DOB: 06/13/1928,84 y.o.,  MRN: [id number]   Met patient in clinic today with provider.  Time spent with patient   Weight @ clinic-197 Weight @ home-198  Pt states he is feeling well, no sob noted. Weight stable at home. No missed doses of meds. Adding back atorvastatin to his regimen and they advised will p/u on way home.  Pill box refilled here in clinic. Pt is done with your amoxicillan.    Marylouise Stacks, EMT-Paramedic 02/20/2018   ACTION: Home visit completed

## 2018-02-21 NOTE — Progress Notes (Signed)
Patient ID: EINAR NOLASCO, male   DOB: 07/10/1928, 84 y.o.   MRN: [id number] PCP: Dr. Jerline Pain Cardiology: Dr. [name]   84 y.o. with history of CKD, CAD s/p CABG, and ischemic cardiomyopathy with primarily diastolic CHF presents for followup of CHF and CAD.  He has a history of PAD followed at [facility].  I took him for [facility] in 11/15. 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  He had had occlusion of a relatively small LAD, there were right to left collaterals and no intervention was done (medical management).    He and his wife went on a cruise along the [place] in 5/16.  He admits to considerable dietary indiscretion (high sodium diet).  It appears that he developed a hypertensive crisis along with chest pain while on the ship. He went into atrial fibrillation and developed CHF.  He was taken to the hospital in [place], [place].  He was in the ICU for about a week on Bipap intermittently.  Troponin peaked at 0.09 during this admission.  He was diuresed and after about 2 wks left the hospital and was able to fly home.    Cardiolite (6/16) showed EF 42%, prior anterolateral MI, no ischemia. Echo in 10/16 with EF 40-45%.    He was admitted on 07/06/15 for RLQ pain and fever.  He was treated for UTI and had a kidney stone as well.  He developed SOB after IVF were given for AKI.  He was treated for acute COPD exacerbation as well as CHF decompensation.  He was diuresed with IV lasix.  His weight on admission was 179 and got as high as 188 after IVF. Discharge weight was 181.  He was admitted in 10/16 with a cerebellar CVA.  He has some resultant imbalance.   He was again admitted in 11/16 with acute on chronic systolic CHF.  This was a short admission that appeared to be precipitated by a sodium load from eating at a [place] [employer] .   3/18 Echo showed stable EF 40-45%.  He was also having chest pain so I did a Cardiolite in 3/18, no ischemia.   He was admitted in 11/18 for elective hernia  repair. Post-op, he developed hypoxemia and had a rehab stay.  He is now home and off oxygen.   Creatinine increased to 2.97 in 1/19.  I cut back on losartan and Lasix.  He developed increased lower extremity edema and we increased Lasix back to 120 qam/80 qpm.   Most recent echo in 2/19 showed EF up to 60-65%.    He returns for followup of CHF.  He has stable calf claudication after walking about a block, no lower extremity ulcers.  No chest pain.  Breathing has improved, weight is down 4 lbs with less peripheral edema.  No dyspnea walking on flat ground.  No orthopnea/PND.   ECG (personally reviewed): NSR, 1st degree AV block, IVCD 136 msec, poor RWP  Labs (12/14): K 4.8, creatinine 1.5 Labs (5/15): LDL 92 Labs (11/15): K 4.1, creatinine 1.5 Labs (12/15): K 4, creatinine 1.5 Labs (1/16): LFTs normal, LDL 51, HDL 47 Labs (5/16): TnI 0.09 Labs (6/16): K 4.8 => 4.4, creatinine 1.79 => 2.26, HCT 28.7 Labs (7/16) K 4.2, creatinine 1.98, LFTs normal, TSH normal Labs (8/16): HCT 27.1 Labs (9/16): hgb 8.1, K 4.3, creatinine 1.8, LFTs normal, TSH elevated Labs (10/16): LDL 74, HDL 45, TSH 5.19 (increased), free T3 and free T4 normal.  Labs (11/16): K  3.5, creatinine 1.47, AST 84, ALT 89 Labs (12/16): K 4.3, creatinine 1.58, AST 43, ALT 42 Labs (3/17): K 4.2, creatinine 1.4, hgb 10.7, AST 36, ALT 45 Labs (6/17): K 4, creatinine 1.69, hgb 10.9, proBNP 389, TSH mildly elevated Labs (8/17): Free T4 and T3 normal, TSH mildly elevated, K 4.2, creatinine 1.7, LFTs normal, hgb 10.4 Labs (12/17): TSH elevated but free T3 and T4 normal, K 4.5, creatinine 1.9, LDL 45, HDL 39, LFTs normal, HCT 32.9 Labs (1/18): K 4.4, creatinine 1.93 Labs (3/18): K 4.3, creatinine 1.87, BNP 187, hgb 11.1, LDL 75, LFTs normal Labs (4/18): K 4.1, creatinine 1.7 Labs (9/18): K 4, creatinine 1.8, hgb 11.9 Labs (10/18): free T4 0.4, free T3 1.9, TSH 139  Labs (12/18): K 3.4, creatinine 1.76, hgb 12.2 Labs (1/19): K 4 =>  4.9, creatinine 2.7 => 2.97, TSH 179, LFTs normal Labs (2/19): K 4.6, creatinine 2.19 => 1.86, LDL 159, HDL 38  PMH: 1. CKD stage 3 2. HTN 3. Hyperlipidemia 4. AAA: s/p surgery with left renal artery bypass in 1997 - Abdominal US 10/18 with 3.2 AAA 5. GERD 6. IBS 7. Melanoma s/p reception 8. Diabetes: Diet-controlled 9. Carotid stenosis: Carotid dopplers (4/15) with 40-59% bilateral stenosis. Carotid dopplers (7/16) with < 40% BICA stenosis.  Carotid dopplers (10/16) with 40-59% BICA stenosis.  - Carotids (8/17) with minimal disease.  - Carotids (10/18) with 1-39% BICA stenosis.  - Carotids (1/19): 1-39% BICA stenosis 10. PAD: 9/14 ABIs 0.91 right 1.09 left.  7/16 ABIs 0.86 right, 1.1 left; aortoiliac duplex with < 50% bilateral iliac stenosis.  - Aortoiliac duplex (8/17) with < 50% bilateral iliac stenosis.   - ABIs (10/17): left 0.91, right 0.98, left TBI 0.44 (abnormal).  - ABIs (7/18): right 0.97, right 0.75 - ABIs (1/19): right 1.04, left 1.03 11. CAD: Has super-dominant RCA. s/p CABG 1992.  He had Taxus DES to mid PDA in 5/02. LHC (1/05) with 90% ostial PDA (had DES to this vessel), totally occluded RIMA-PDA, totally occluded PLV, sequential SVG-PLV with 1 branch occluded.  LHC (11/15) with left main off the right cusp, 40-50% ostial left main, total occlusion of the proximal LAD (small vessel) with right to left collaterals, large super-dominant RCA with 50% ISR mid RCA, 50% ostial PDA, patent SVG-PLV, RIMA-PDA known atretic. Lexiscan Cardiolite (6/16) with EF 42%, prior anterolateral MI, no ischemia.  - Lexiscan Cardiolite (3/18): EF 51%, basal to mid anterolateral fixed defect with no ischemia.  12. Ischemic cardiomyopathy: Echo (12/09) with EF 45-50%, basal to mid inferolateral hypokinesis. Echo (6/15) with EF 45-50%, akinesis of the mid to apical inferolateral wall.  Echo (6/16) with EF 50-55%, mild LVH, inferior hypokinesis, mild MR.  Echo (10/16) with EF 40-45%, moderate LVH,  mildly decreased RV systolic function.  - Echo (3/18) with EF 40-45%, mid anterior hypokinesis.  - Echo (2/19): EF 60-65%, mild LVH, moderate diastolic dysfunction.  13. OA 14. Atrial fibrillation/flutter: Paroxysmal.  Noted during 5/16 hospitalization in [place] and in 6/16.  He developed atrial flutter in 8/16, back in NSR by 9/16.  15. Emphysema: PFTs (8/16) with FVC 88%, FEV1 74%, ratio 81%, TLC 83%, DLCO 31%. - PFTs (12/18): FVC 64%, FEV1 69%, ratio 105%, TLC 82%, DLCO 33% => mild obstruction, severely decreased DLCO (similar to prior).   16. Renal cysts 17. Idiopathic peripheral neuropathy.  18. Anemia.  102. 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  20. Hernia repair (11/18) 21. Hypothyroidism 22. Bronchiectasis: CT chest 1/19 with bronchiectasis.  23. Lung nodule: 1.3  cm RML nodule on 1/19 CT, was noted in 2014 => slow growing.   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  FH: Brother with CAD.   ROS: All systems reviewed and negative except as per HPI.   Current Outpatient Medications  Medication Sig Dispense Refill  . acetaminophen (TYLENOL) 500 MG tablet Take 1,000 mg by mouth every 8 (eight) hours as needed for moderate pain.     Marland Kitchen ALPRAZolam (XANAX) 0.5 MG tablet TAKE 1/2 TABLET TWICE DAILY AS NEEDED FOR ANXIETY. (Patient taking differently: Take 0.25 mg by mouth twice daily as needed for anxiety) 60 tablet 5  . amiodarone (PACERONE) 100 MG tablet Take 1 tablet (100 mg total) by mouth daily. 30 tablet 3  . amoxicillin (AMOXIL) 875 MG tablet Take 875 mg by mouth 2 (two) times daily.    . carvedilol (COREG) 12.5 MG tablet TAKE 1 TABLET TWICE DAILY. 60 tablet 3  . cilostazol (PLETAL) 100 MG tablet TAKE 1 TABLET TWICE DAILY. 180 tablet 3  . ELIQUIS 2.5 MG TABS tablet TAKE 1 TABLET BY MOUTH TWICE DAILY. 60 tablet 11  . ezetimibe (ZETIA) 10 MG tablet TAKE (1) TABLET DAILY. 90 tablet 0  . FLUoxetine (PROZAC) 20 MG capsule TAKE (1) CAPSULE  DAILY. 90 capsule 0  . furosemide (LASIX) 80 MG tablet Take 120mg  (1.5 tablets)  in the AM and 80mg (1 tablet)  in the PM 75 tablet 6  . glucose blood (ACCU-CHEK AVIVA PLUS) test strip CHECK BLOOD SUGAR 4 TIMES A DAY. 100 each 11  . hydrALAZINE (APRESOLINE) 25 MG tablet Take 1 tablet (25 mg total) by mouth 3 (three) times daily. D/c order for Losartan. 90 tablet 3  . HYDROcodone-acetaminophen (NORCO/VICODIN) 5-325 MG tablet Take 1 tablet by mouth every 4 (four) hours as needed for moderate pain. 24 tablet 0  . Investigational - Study Medication Take 1 tablet by mouth daily. Study name: [place]  Additional study details: MK-1242 (vericiguat) or Placebo 1 each PRN  . Lancets MISC Check blood sugar 4 times a day 100 each 11  . levothyroxine (SYNTHROID, LEVOTHROID) 75 MCG tablet Take 1 tablet (75 mcg total) by mouth daily before breakfast. 30 tablet 3  . Multiple Vitamin (MULTIVITAMIN) tablet Take 1 tablet by mouth daily.    Marland Kitchen NITROSTAT 0.4 MG SL tablet DISSOLVE 1 TABLET UNDER TONGUE AS NEEDED FOR CHEST PAIN,MAY REPEAT IN5 MINUTES FOR 2 DOSES. (Patient not taking: Reported on 02/20/2018) 25 tablet 6  . potassium chloride 20 MEQ TBCR Take 20 mEq by mouth daily. 30 tablet 3  . temazepam (RESTORIL) 30 MG capsule TAKE 1 CAPSULE AT BEDTIME AS NEEDED FOR SLEEP. 30 capsule 0  . tiotropium (SPIRIVA HANDIHALER) 18 MCG inhalation capsule Place 1 capsule (18 mcg total) into inhaler and inhale daily. 30 capsule 12  . traMADol (ULTRAM) 50 MG tablet Take 1 tablet (50 mg total) by mouth every 6 (six) hours as needed. 12 tablet 0  . atorvastatin (LIPITOR) 80 MG tablet Take 1 tablet (80 mg total) by mouth daily. 30 tablet 3   No current facility-administered medications for this encounter.    BP (!) 119/52   Pulse 80   Wt 197 lb 8 oz (89.6 kg)   SpO2 94%   BMI 30.93 kg/m   General: NAD Neck: JVP 7-8 cm, no thyromegaly or thyroid nodule.  Lungs: Clear to auscultation bilaterally with normal respiratory  effort. CV: Nondisplaced PMI.  Heart regular S1/S2, no S3/S4, no  murmur.  1+ ankle edema.  No carotid bruit.  Difficult to palpate pedal pulses.  Abdomen: Soft, nontender, no hepatosplenomegaly, no distention.  Skin: Intact without lesions or rashes.  Neurologic: Alert and oriented x 3.  Psych: Normal affect. Extremities: No clubbing or cyanosis.  HEENT: Normal.    Assessment/Plan:  1. CAD: s/p CABG.  Cath in 11/15 showed occluded LAD with left to right collaterals.  The LAD was a relatively small vessel (super-dominant right).  Managed medically. Lexiscan Cardiolite in 6/16 with infarction but no ischemia.  Cardiolite 3/18 with infarction, no ischemia. No chest pain.  - Continue ASA 81.   - Continue statin. - Continue Coreg.   2. Ischemic cardiomyopathy: EF 40-45% on 3/18 echo, improved to 60-65% on echo in 2/19.  He is doing better symptomatically, NYHA class II-III.  Volume status looks better and weight is down.  - Continue hydralazine 25 mg tid in addition to [place] study drug (vericiguat versus placebo).  - Continue Coreg 12.5 mg bid.    - Continue Lasix 120 qam/80 qpm.  - Continue to wear graded compression stockings during the day.  3. Carotid stenosis: Stable, followed at [facility].  4. PAD: Stable claudication, ABIs with some improvement in 1/19.  No pedal ulcers.  Seen by [facility], plan for medical management.  - Continue cilostazol, unable to come off due to worsening claudication when he stops it.  5. Hyperlipidemia: LDL is was much higher when recently checked.  On discussion with him, it appears that he has not taken atorvastatin for many months now.  He thinks he just has not picked it up from the pharmacy, no side effects.   - Restart atorvastatin 80 mg daily with lipids/LFTs in 2 months.  6. CKD III: BMET done today, creatinine up to 2.97 in 1/19, down to 1.86 most recently.  - He is going to stay off losartan.  7. Atrial fibrillation/flutter:  Paroxysmal, NSR today.   - Continue  amiodarone 100 mg daily. Will need to get regular eye exams with amiodarone use.  Normal LFTs in 1/19.  High resolution CT in 1/19 did not appear to show evidence for amiodarone lung toxicity.  - Continue Eliquis 2.5 bid.   8. Suspected OSA: He cancelled his sleep study due to mask discomfort 9. COPD: He is now back off oxygen.  - Based on PFT abnormality with mild obstruction but severely decreased DLCO.  1/19 high resolution CT chest showed bronchiectasis. He has appointment with Dr. Lake Bells scheduled.  10. CVA: Cerebellar, 10/16.  He has had some resulting imbalance.  He is no longer driving.  11. Hypertension: BP controlled on current regimen.  12. Hypothyroidism: Likely related to amiodarone. I started him on levothyroxine and recently increased it.  TSH was still very high last checked.  - I will defer to PCP for further management.  13. Pulmonary nodule: 1.3 cm RML nodule on 1/19 CT chest.  It was also seen in 2014 => slow growing, think repeat CT 1 year is most appropriate.  He will see pulmonary soon and will get their opinion.      Followup 6 wks  Loralie Champagne 02/21/2018

## 2018-02-22 ENCOUNTER — Ambulatory Visit: Payer: Medicare Other | Admitting: Pulmonary Disease

## 2018-02-22 ENCOUNTER — Encounter: Payer: Self-pay | Admitting: Pulmonary Disease

## 2018-02-22 VITALS — BP 130/60 | HR 80 | Ht 67.0 in | Wt 199.0 lb

## 2018-02-22 DIAGNOSIS — J849 Interstitial pulmonary disease, unspecified: Secondary | ICD-10-CM

## 2018-02-22 DIAGNOSIS — I5022 Chronic systolic (congestive) heart failure: Secondary | ICD-10-CM | POA: Diagnosis not present

## 2018-02-22 DIAGNOSIS — R911 Solitary pulmonary nodule: Secondary | ICD-10-CM | POA: Diagnosis not present

## 2018-02-22 NOTE — Progress Notes (Signed)
Subjective:   PATIENT ID: Andrew [name] GENDER: male DOB: 11-Nov-1928, MRN: [id number]  Synopsis: Referred in February 2019 by Dr. Algernon [name] for an abnormal PFT.  History of chronic kidney disease coronary disease status post coronary artery bypass grafting ischemic cardiomyopathy with primarily diastolic heart failure.  He also has a history of peripheral arterial disease.  CABG was performed in 1992.  He has had various stents placed since then.  2019 echocardiogram showed an LVEF of 60-65% with mild LVH.  HPI  Chief Complaint  Patient presents with  . Advice Only    referred by Dr. Aundra [name] for abn pft.     Andrew [name] was sent to me for assessment of some dyspnea: > it occurs on exertion > it doesn't happen every time he exerts himself > it has been present for a year > it has not limited his activity > he hasn't adjusted his routine due to it  He will occasionally cough.   No problems swallowing, never had food get stuck.  He smoked until 1955.  He smoked for about 7 years in his late teenage years.  He has a long history of CHF: > he follows with Dr. Aundra [name] for this > this will worsen with sodium intake > he says that his weight has been steady in the last year, near the 190's   Past Medical History:  Diagnosis Date  . AAA (abdominal aortic aneurysm) ([place])   . Anemia   . Arthritis   . CAD (coronary artery disease)   . Cancer ([place])    Melanoma - Back  . Carotid artery stenosis   . CHF (congestive heart failure) ([place])   . Cyst of kidney, acquired   . Depression   . Diabetes mellitus   . DVT (deep venous thrombosis) ([place])   . Dysrhythmia   . Fatty liver 2008  . GERD (gastroesophageal reflux disease)   . Hyperlipidemia   . Hypertension   . IBS (irritable bowel syndrome)   . LVF (left ventricular failure) ([place])   . Paroxysmal atrial fibrillation ([facility])   . Pneumonia Jan. 2014  . Shortness of breath dyspnea   . Thyroid disease   . Typical atrial flutter ([place])     . UTI (urinary tract infection) 05/2015   While in [place]   . Vitamin D deficiency      Family History  Problem Relation Age of Onset  . Colon cancer Father   . Coronary artery disease Brother   . Diabetes Brother   . Heart disease Brother   . Hyperlipidemia Brother   . Peripheral vascular disease Brother        Varicose Veins  . Throat cancer Brother        abdominal cancer?   . Diabetes Son   . Hyperlipidemia Son      Social History   Socioeconomic History  . Marital status: Married    Spouse name: Not on file  . Number of children: Not on file  . Years of education: Not on file  . Highest education level: Not on file  Social Needs  . Financial resource strain: Not on file  . Food insecurity - worry: Not on file  . Food insecurity - inability: Not on file  . Transportation needs - medical: Not on file  . Transportation needs - non-medical: Not on file  Occupational History  . Occupation: Retired  Tobacco Use  . Smoking status: Former Smoker    Packs/day: 1.50  Years: 35.00    Pack years: 52.50    Types: Cigarettes  . Smokeless tobacco: Never Used  . Tobacco comment: quit atleast 25 yrs ago, per pt  Substance and Sexual Activity  . Alcohol use: Yes    Alcohol/week: 1.8 oz    Types: 3 Shots of liquor per week    Comment: socially  . Drug use: No  . Sexual activity: Yes  Other Topics Concern  . Not on file  Social History Narrative   Pt lives in [place] with spouse.   Retired from [employer].  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     Allergies  Allergen Reactions  . Metoprolol Shortness Of Breath, Palpitations and Other (See Comments)    Heart starts racing. Shallow breathing   . Metformin And Related Nausea And Vomiting     Outpatient Medications Prior to Visit  Medication Sig Dispense Refill  . acetaminophen (TYLENOL) 500 MG tablet Take 1,000 mg by mouth every 8 (eight) hours as needed for  moderate pain.     Marland Kitchen ALPRAZolam (XANAX) 0.5 MG tablet TAKE 1/2 TABLET TWICE DAILY AS NEEDED FOR ANXIETY. (Patient taking differently: Take 0.25 mg by mouth twice daily as needed for anxiety) 60 tablet 5  . amiodarone (PACERONE) 100 MG tablet Take 1 tablet (100 mg total) by mouth daily. 30 tablet 3  . atorvastatin (LIPITOR) 80 MG tablet Take 1 tablet (80 mg total) by mouth daily. 30 tablet 3  . carvedilol (COREG) 12.5 MG tablet TAKE 1 TABLET TWICE DAILY. 60 tablet 3  . cilostazol (PLETAL) 100 MG tablet TAKE 1 TABLET TWICE DAILY. 180 tablet 3  . ELIQUIS 2.5 MG TABS tablet TAKE 1 TABLET BY MOUTH TWICE DAILY. 60 tablet 11  . ezetimibe (ZETIA) 10 MG tablet TAKE (1) TABLET DAILY. 90 tablet 0  . FLUoxetine (PROZAC) 20 MG capsule TAKE (1) CAPSULE DAILY. 90 capsule 0  . furosemide (LASIX) 80 MG tablet Take 120mg  (1.5 tablets)  in the AM and 80mg (1 tablet)  in the PM 75 tablet 6  . glucose blood (ACCU-CHEK AVIVA PLUS) test strip CHECK BLOOD SUGAR 4 TIMES A DAY. 100 each 11  . hydrALAZINE (APRESOLINE) 25 MG tablet Take 1 tablet (25 mg total) by mouth 3 (three) times daily. D/c order for Losartan. 90 tablet 3  . HYDROcodone-acetaminophen (NORCO/VICODIN) 5-325 MG tablet Take 1 tablet by mouth every 4 (four) hours as needed for moderate pain. 24 tablet 0  . Investigational - Study Medication Take 1 tablet by mouth daily. Study name: [place]  Additional study details: MK-1242 (vericiguat) or Placebo 1 each PRN  . Lancets MISC Check blood sugar 4 times a day 100 each 11  . levothyroxine (SYNTHROID, LEVOTHROID) 75 MCG tablet Take 1 tablet (75 mcg total) by mouth daily before breakfast. 30 tablet 3  . Multiple Vitamin (MULTIVITAMIN) tablet Take 1 tablet by mouth daily.    Marland Kitchen NITROSTAT 0.4 MG SL tablet DISSOLVE 1 TABLET UNDER TONGUE AS NEEDED FOR CHEST PAIN,MAY REPEAT IN5 MINUTES FOR 2 DOSES. 25 tablet 6  . potassium chloride 20 MEQ TBCR Take 20 mEq by mouth daily. 30 tablet 3  . temazepam (RESTORIL) 30 MG capsule  TAKE 1 CAPSULE AT BEDTIME AS NEEDED FOR SLEEP. 30 capsule 0  . tiotropium (SPIRIVA HANDIHALER) 18 MCG inhalation capsule Place 1 capsule (18 mcg total) into inhaler and inhale daily. 30 capsule 12  . traMADol (ULTRAM) 50 MG tablet Take 1 tablet (50 mg total) by  mouth every 6 (six) hours as needed. 12 tablet 0  . amoxicillin (AMOXIL) 875 MG tablet Take 875 mg by mouth 2 (two) times daily.     No facility-administered medications prior to visit.     ROS    Objective:  Physical Exam   Vitals:   02/22/18 1510  BP: 130/60  Pulse: 80  SpO2: 92%  Weight: 199 lb (90.3 kg)  Height: 5\' 7"  (1.702 m)    /Gen: chronically ill appearing, no acute distress HENT: NCAT, OP clear, neck supple without masses Eyes: PERRL, EOMi Lymph: no cervical lymphadenopathy PULM: Crackles bases  CV: RRR, no mgr, no JVD GI: BS+, soft, nontender, no hsm Derm: no rash or skin breakdown MSK: normal bulk and tone Neuro: A&Ox4, CN II-XII intact, strength 5/5 in all 4 extremities Psyche: normal mood and affect   CBC    Component Value Date/Time   WBC 9.0 01/19/2018 1629   RBC 3.46 (L) 01/19/2018 1629   HGB 11.5 (L) 01/19/2018 1629   HCT 35.3 (L) 01/19/2018 1629   PLT 187 01/19/2018 1629   MCV 102.0 (H) 01/19/2018 1629   MCH 33.2 01/19/2018 1629   MCHC 32.6 01/19/2018 1629   RDW 13.6 01/19/2018 1629   LYMPHSABS 1.6 08/27/2017 1551   MONOABS 0.6 08/27/2017 1551   EOSABS 0.2 08/27/2017 1551   BASOSABS 0.0 08/27/2017 1551     Chest imaging: January 2019 CT chest images independently reviewed showing a patulous esophagus, basal predominant nonspecific groundglass and fibrotic changes no reticular changes, some bronchiectasis in the bases, a small groundglass area in the right middle lobe of undetermined significance.  PFT: December 2018 lung function testing ratio 72%, FEV1 1.49 L 71% predicted, FVC 2.05 L 67% predicted, total lung capacity 5.2 L 82% predicted, DLCO 9.02 mL 33%  predicted  Labs:  Path:  Echo: 2019 echocardiogram showed an LVEF of 60-65% with mild LVH. Normal RV   Heart Catheterization:       Assessment & Plan:   No diagnosis found.  Discussion: This is a pleasant 84 year old male with heart failure who comes to my clinic today for an abnormal lung function test and CT scan which shows nonspecific fibrotic changes and bronchiectasis in the bases of his lungs.  I think he likely has aspiration related pneumonitis.  The differential diagnosis includes a nonspecific form of interstitial pneumonia but I think that is less likely considering his advanced age and lack of underlying connective tissue disease.  However, we do know that some patients with long-standing congestive heart failure can develop an NSIP-like picture.  I think the best approach moving forward is to assess his esophagus with a barium swallow to look for evidence of either esophageal obstruction or poor esophageal muscular contraction.  In addition he has the unpleasant finding of a 1.3 cm nodule in the right middle lobe which has been growing slowly over time.  In all likelihood this is a slowly growing adenocarcinoma.  I really have no idea what to do with this.  I doubt he will die from adenocarcinoma of the lung considering his advanced age.  However, we could potentially biopsy this and offer treatment options.  I am going to reach out to his heart failure physician and some of my other colleagues to discuss.  Plan: Abnormal CT chest/findings of interstitial lung disease: As we discussed today in clinic I think this is related to chronic aspiration We will get another lung function test in May 2019 We will also  get a barium swallow test to look to see if you have trouble swallowing  Dilated esophagus seen on CT scan of the chest: We will order a barium swallow  Pulmonary nodule in the right middle lobe: I do worry that this represents a slow-growing malignancy I am  going to talk to Dr. Algernon [name] and some of my colleagues about the risks and benefits of performing a biopsy  We will see you back in 2 months or sooner if needed    Current Outpatient Medications:  .  acetaminophen (TYLENOL) 500 MG tablet, Take 1,000 mg by mouth every 8 (eight) hours as needed for moderate pain. , Disp: , Rfl:  .  ALPRAZolam (XANAX) 0.5 MG tablet, TAKE 1/2 TABLET TWICE DAILY AS NEEDED FOR ANXIETY. (Patient taking differently: Take 0.25 mg by mouth twice daily as needed for anxiety), Disp: 60 tablet, Rfl: 5 .  amiodarone (PACERONE) 100 MG tablet, Take 1 tablet (100 mg total) by mouth daily., Disp: 30 tablet, Rfl: 3 .  atorvastatin (LIPITOR) 80 MG tablet, Take 1 tablet (80 mg total) by mouth daily., Disp: 30 tablet, Rfl: 3 .  carvedilol (COREG) 12.5 MG tablet, TAKE 1 TABLET TWICE DAILY., Disp: 60 tablet, Rfl: 3 .  cilostazol (PLETAL) 100 MG tablet, TAKE 1 TABLET TWICE DAILY., Disp: 180 tablet, Rfl: 3 .  ELIQUIS 2.5 MG TABS tablet, TAKE 1 TABLET BY MOUTH TWICE DAILY., Disp: 60 tablet, Rfl: 11 .  ezetimibe (ZETIA) 10 MG tablet, TAKE (1) TABLET DAILY., Disp: 90 tablet, Rfl: 0 .  FLUoxetine (PROZAC) 20 MG capsule, TAKE (1) CAPSULE DAILY., Disp: 90 capsule, Rfl: 0 .  furosemide (LASIX) 80 MG tablet, Take 120mg  (1.5 tablets)  in the AM and 80mg (1 tablet)  in the PM, Disp: 75 tablet, Rfl: 6 .  glucose blood (ACCU-CHEK AVIVA PLUS) test strip, CHECK BLOOD SUGAR 4 TIMES A DAY., Disp: 100 each, Rfl: 11 .  hydrALAZINE (APRESOLINE) 25 MG tablet, Take 1 tablet (25 mg total) by mouth 3 (three) times daily. D/c order for Losartan., Disp: 90 tablet, Rfl: 3 .  HYDROcodone-acetaminophen (NORCO/VICODIN) 5-325 MG tablet, Take 1 tablet by mouth every 4 (four) hours as needed for moderate pain., Disp: 24 tablet, Rfl: 0 .  Investigational - Study Medication, Take 1 tablet by mouth daily. Study name: [place]  Additional study details: MK-1242 (vericiguat) or Placebo, Disp: 1 each, Rfl: PRN .  Lancets  MISC, Check blood sugar 4 times a day, Disp: 100 each, Rfl: 11 .  levothyroxine (SYNTHROID, LEVOTHROID) 75 MCG tablet, Take 1 tablet (75 mcg total) by mouth daily before breakfast., Disp: 30 tablet, Rfl: 3 .  Multiple Vitamin (MULTIVITAMIN) tablet, Take 1 tablet by mouth daily., Disp: , Rfl:  .  NITROSTAT 0.4 MG SL tablet, DISSOLVE 1 TABLET UNDER TONGUE AS NEEDED FOR CHEST PAIN,MAY REPEAT IN5 MINUTES FOR 2 DOSES., Disp: 25 tablet, Rfl: 6 .  potassium chloride 20 MEQ TBCR, Take 20 mEq by mouth daily., Disp: 30 tablet, Rfl: 3 .  temazepam (RESTORIL) 30 MG capsule, TAKE 1 CAPSULE AT BEDTIME AS NEEDED FOR SLEEP., Disp: 30 capsule, Rfl: 0 .  tiotropium (SPIRIVA HANDIHALER) 18 MCG inhalation capsule, Place 1 capsule (18 mcg total) into inhaler and inhale daily., Disp: 30 capsule, Rfl: 12 .  traMADol (ULTRAM) 50 MG tablet, Take 1 tablet (50 mg total) by mouth every 6 (six) hours as needed., Disp: 12 tablet, Rfl: 0

## 2018-02-22 NOTE — Patient Instructions (Signed)
Abnormal CT chest/findings of interstitial lung disease: As we discussed today in clinic I think this is related to chronic aspiration We will get another lung function test in May 2019 We will also get a barium swallow test to look to see if you have trouble swallowing  Dilated esophagus seen on CT scan of the chest: We will order a barium swallow  Pulmonary nodule in the right middle lobe: I do worry that this represents a slow-growing malignancy I am going to talk to Dr. Algernon Huxley and some of my colleagues about the risks and benefits of performing a biopsy  We will see you back in 2 months or sooner if needed

## 2018-02-23 ENCOUNTER — Other Ambulatory Visit (HOSPITAL_COMMUNITY): Payer: Self-pay | Admitting: Cardiology

## 2018-02-23 ENCOUNTER — Ambulatory Visit: Payer: Medicare Other | Admitting: Podiatry

## 2018-02-26 ENCOUNTER — Encounter (HOSPITAL_COMMUNITY): Payer: Medicare Other

## 2018-02-27 ENCOUNTER — Other Ambulatory Visit (HOSPITAL_COMMUNITY): Payer: Self-pay | Admitting: Pharmacist

## 2018-02-27 ENCOUNTER — Other Ambulatory Visit (HOSPITAL_COMMUNITY): Payer: Self-pay

## 2018-02-27 ENCOUNTER — Other Ambulatory Visit: Payer: Self-pay | Admitting: Family Medicine

## 2018-02-27 ENCOUNTER — Telehealth: Payer: Self-pay | Admitting: Family Medicine

## 2018-02-27 MED ORDER — FUROSEMIDE 80 MG PO TABS: ORAL_TABLET | ORAL | 6 refills | Status: DC

## 2018-02-27 NOTE — Telephone Encounter (Signed)
See note

## 2018-02-27 NOTE — Telephone Encounter (Signed)
Copied from [place] [id number]. Topic: Quick Communication - See Telephone Encounter >> Feb 27, 2018  1:56 PM Ether Griffins B wrote: CRM for notification. See Telephone encounter for:  Andrew [name] with [facility] calling needing verbal orders for additional skilled nursing for disease management and med teaching 1x a week for 4 weeks. Call back number [phone].  02/27/18.

## 2018-02-27 NOTE — Progress Notes (Signed)
Paramedicine Encounter    Patient ID: Andrew [name], male    DOB: [birth year], 84 y.o.   MRN: [id number]   Patient Care Team: [name], MD as PCP - General (Family Medicine) Larey Dresser, MD as Consulting Physician (Cardiology)  Patient Active Problem List   Diagnosis Date Noted  . Bilateral recurrent inguinal hernias 11/28/2017  . Calf pain 05/25/2016  . Carotid artery stenosis 01/12/2016  . Chronic systolic CHF (congestive heart failure) ([place]) 12/02/2015  . Cerebellar infarct ([place]) 10/16/2015  . 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 PVD (peripheral vascular disease) ([place])   . Hereditary and idiopathic peripheral neuropathy 10/02/2015  . Peripelvic (lymphatic) cyst   . Pernicious anemia 07/15/2015  . Bilateral renal cysts 07/12/2015  . Lung nodule, 83mm RML CT 07/12/15 07/12/2015  . Constipation 07/12/2015  . Kidney stone on right side 07/12/2015  . Stage 3 chronic kidney disease ([place]) 06/14/2015  . Chronic diastolic CHF (congestive heart failure) ([place]) 05/28/2015  . Paroxysmal atrial fibrillation ([place]) 05/28/2015  . AAA (abdominal aortic aneurysm) without rupture ([place]) 09/30/2014  . Left-sided low back pain without sciatica 07/21/2014  . Cardiomyopathy, ischemic 05/23/2014  . DM (diabetes mellitus), type 2 with renal complications ([place]) 54/00/8676  . CORONARY ATHEROSCLEROSIS NATIVE CORONARY ARTERY 11/09/2010  . Depression, major, single episode, complete remission ([place]) 03/18/2010  . Hypothyroidism 02/03/2009  . Hyperlipidemia associated with type 2 diabetes mellitus ([place]) 06/14/2007  . Hypertension associated with diabetes ([place]) 06/14/2007  . GERD 06/14/2007    Current Outpatient Medications:  .  acetaminophen (TYLENOL) 500 MG tablet, Take 1,000 mg by mouth every 8 (eight) hours as needed for moderate pain. , Disp: , Rfl:  .  ALPRAZolam (XANAX) 0.5 MG tablet, TAKE 1/2 TABLET TWICE DAILY AS NEEDED FOR ANXIETY. (Patient taking  differently: Take 0.25 mg by mouth twice daily as needed for anxiety), Disp: 60 tablet, Rfl: 5 .  amiodarone (PACERONE) 100 MG tablet, Take 1 tablet (100 mg total) by mouth daily., Disp: 30 tablet, Rfl: 3 .  atorvastatin (LIPITOR) 80 MG tablet, Take 1 tablet (80 mg total) by mouth daily., Disp: 30 tablet, Rfl: 3 .  carvedilol (COREG) 12.5 MG tablet, TAKE 1 TABLET TWICE DAILY., Disp: 60 tablet, Rfl: 3 .  cilostazol (PLETAL) 100 MG tablet, TAKE 1 TABLET TWICE DAILY., Disp: 180 tablet, Rfl: 3 .  ELIQUIS 2.5 MG TABS tablet, TAKE 1 TABLET BY MOUTH TWICE DAILY., Disp: 60 tablet, Rfl: 11 .  ezetimibe (ZETIA) 10 MG tablet, TAKE (1) TABLET DAILY., Disp: 90 tablet, Rfl: 0 .  FLUoxetine (PROZAC) 20 MG capsule, TAKE (1) CAPSULE DAILY., Disp: 90 capsule, Rfl: 0 .  glucose blood (ACCU-CHEK AVIVA PLUS) test strip, CHECK BLOOD SUGAR 4 TIMES A DAY., Disp: 100 each, Rfl: 11 .  hydrALAZINE (APRESOLINE) 25 MG tablet, Take 1 tablet (25 mg total) by mouth 3 (three) times daily. D/c order for Losartan., Disp: 90 tablet, Rfl: 3 .  HYDROcodone-acetaminophen (NORCO/VICODIN) 5-325 MG tablet, Take 1 tablet by mouth every 4 (four) hours as needed for moderate pain., Disp: 24 tablet, Rfl: 0 .  Investigational - Study Medication, Take 1 tablet by mouth daily. Study name: [place]  Additional study details: MK-1242 (vericiguat) or Placebo, Disp: 1 each, Rfl: PRN .  Lancets MISC, Check blood sugar 4 times a day, Disp: 100 each, Rfl: 11 .  levothyroxine (SYNTHROID, LEVOTHROID) 75 MCG tablet, Take 1 tablet (75 mcg total) by mouth daily before breakfast., Disp: 30 tablet, Rfl:  3 .  Multiple Vitamin (MULTIVITAMIN) tablet, Take 1 tablet by mouth daily., Disp: , Rfl:  .  potassium chloride 20 MEQ TBCR, Take 20 mEq by mouth daily., Disp: 30 tablet, Rfl: 3 .  temazepam (RESTORIL) 30 MG capsule, TAKE 1 CAPSULE AT BEDTIME AS NEEDED FOR SLEEP., Disp: 30 capsule, Rfl: 0 .  tiotropium (SPIRIVA HANDIHALER) 18 MCG inhalation capsule, Place 1  capsule (18 mcg total) into inhaler and inhale daily., Disp: 30 capsule, Rfl: 12 .  traMADol (ULTRAM) 50 MG tablet, Take 1 tablet (50 mg total) by mouth every 6 (six) hours as needed., Disp: 12 tablet, Rfl: 0 .  furosemide (LASIX) 80 MG tablet, Take 120mg  (1 AND 1/2 tablets)  in the AM and 80mg  (1 tablet)  in the PM, Disp: 75 tablet, Rfl: 6 .  NITROSTAT 0.4 MG SL tablet, DISSOLVE 1 TABLET UNDER TONGUE AS NEEDED FOR CHEST PAIN,MAY REPEAT IN5 MINUTES FOR 2 DOSES. (Patient not taking: Reported on 02/27/2018), Disp: 25 tablet, Rfl: 6 Allergies  Allergen Reactions  . Metoprolol Shortness Of Breath, Palpitations and Other (See Comments)    Heart starts racing. Shallow breathing   . Metformin And Related Nausea And Vomiting      Social History   Socioeconomic History  . Marital status: Married    Spouse name: Not on file  . Number of children: Not on file  . Years of education: Not on file  . Highest education level: Not on file  Social Needs  . Financial resource strain: Not on file  . Food insecurity - worry: Not on file  . Food insecurity - inability: Not on file  . Transportation needs - medical: Not on file  . Transportation needs - non-medical: Not on file  Occupational History  . Occupation: Retired  Tobacco Use  . Smoking status: Former Smoker    Packs/day: 1.50    Years: 35.00    Pack years: 52.50    Types: Cigarettes  . Smokeless tobacco: Never Used  . Tobacco comment: quit atleast 25 yrs ago, per pt  Substance and Sexual Activity  . Alcohol use: Yes    Alcohol/week: 1.8 oz    Types: 3 Shots of liquor per week    Comment: socially  . Drug use: No  . Sexual activity: Yes  Other Topics Concern  . Not on file  Social History Narrative   Pt lives in [place] with spouse.   Retired from [employer].  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    Physical Exam      Future Appointments  Date Time Provider [place]  03/12/2018 10:30 AM [facility] R/F 1 [facility] [place]  03/12/2018  1:40 PM [name], MD [facility] PEC  04/03/2018  3:20 PM Larey Dresser, MD [facility] None  04/10/2018  3:00 PM Gardiner Barefoot, DPM [facility]  04/11/2018 11:00 AM [place] 1 [facility] None  04/19/2018 10:00 AM [facility] LAB [facility] None  05/02/2018 10:00 AM [facility] RM [facility] None  05/02/2018 11:00 AM Juanito Doom, MD [facility]UL[facility] None  07/06/2018  2:00 PM [facility]-CV HS VASC 4 [facility]I VVS  07/06/2018  2:45 PM Nickel, Sharmon Leyden, NP [facility]    BP (!) 150/80   Pulse 84   Resp 15   Wt 191 lb (86.6 kg)   SpO2 93%   BMI 29.91 kg/m  B/P recheck--140/80 Weight yesterday-191 Last visit weight-198  CBG EMS-144  Pt reports he is doing  good, no more falls recently. Pt has home health nurse-it appears she req further visits from PCP.  Called in to pharmacy 4 meds--levo, fluoxetine, hydralazine and for his lasix they have 80mg  BID so I contacted [facility] and req the new dosage be sent in there. He has a 7lb weight loss from last week. Pt denies sob, no dizziness, no h/a. He reports his b/p when he checks it at home has been elevated-174/83 No missed doses of meds this week. Good compliance with the pill box.  B/p recheck with his machine again--123/87. He walked back in to the room with me and I got 150/80 however that was after he walked--rechecked   It after a few min of rest and it was-- He reports he thinks he hadnt taken his meds yet with the higher reading.  meds verified and pill box refilled. He does have increased swelling to his rt leg, left leg appears normal. He said it switches legs at times, it used to be the left leg with swelling but now its the rt.   Marylouise Stacks, [facility] Paramedic  02/27/18

## 2018-03-01 NOTE — Telephone Encounter (Signed)
Spoke with Andrew [name].  Verbal orders given.

## 2018-03-05 ENCOUNTER — Telehealth: Payer: Self-pay | Admitting: Pulmonary Disease

## 2018-03-05 NOTE — Telephone Encounter (Signed)
I called his wife to discuss the pulmonary nodule.  After reviewing the images with some of my colleagues and his cardiology team we think the best approach would be to not perform a biopsy.  She told me that she agrees completely with this plan and states that Andrew [name] has really had a hard time over the last 3 years with his health and she would prefer him not to undergo a biopsy or even treatment if it were lung cancer.

## 2018-03-06 ENCOUNTER — Telehealth (HOSPITAL_COMMUNITY): Payer: Self-pay | Admitting: *Deleted

## 2018-03-06 ENCOUNTER — Other Ambulatory Visit (HOSPITAL_COMMUNITY): Payer: Self-pay | Admitting: Cardiology

## 2018-03-06 ENCOUNTER — Other Ambulatory Visit (HOSPITAL_COMMUNITY): Payer: Self-pay

## 2018-03-06 NOTE — Progress Notes (Signed)
Paramedicine Encounter    Patient ID: Andrew [name], male    DOB: 10/21/1928, 84 y.o.   MRN: [id number]   Patient Care Team: [name], MD as PCP - General (Family Medicine) Larey Dresser, MD as Consulting Physician (Cardiology)  Patient Active Problem List   Diagnosis Date Noted  . Bilateral recurrent inguinal hernias 11/28/2017  . Calf pain 05/25/2016  . Carotid artery stenosis 01/12/2016  . Chronic systolic CHF (congestive heart failure) ([place]) 12/02/2015  . Cerebellar infarct ([place]) 10/16/2015  . 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 ([place]) 10/16/2015  . PVD (peripheral vascular disease) ([place])   . Hereditary and idiopathic peripheral neuropathy 10/02/2015  . Peripelvic (lymphatic) cyst   . Pernicious anemia 07/15/2015  . Bilateral renal cysts 07/12/2015  . Lung nodule, 68mm RML CT 07/12/15 07/12/2015  . Constipation 07/12/2015  . Kidney stone on right side 07/12/2015  . Stage 3 chronic kidney disease ([place]) 06/14/2015  . Chronic diastolic CHF (congestive heart failure) ([place]) 05/28/2015  . Paroxysmal atrial fibrillation ([place]) 05/28/2015  . AAA (abdominal aortic aneurysm) without rupture ([place]) 09/30/2014  . Left-sided low back pain without sciatica 07/21/2014  . Cardiomyopathy, ischemic 05/23/2014  . DM (diabetes mellitus), type 2 with renal complications ([place]) 17/00/1749  . CORONARY ATHEROSCLEROSIS NATIVE CORONARY ARTERY 11/09/2010  . Depression, major, single episode, complete remission ([place]) 03/18/2010  . Hypothyroidism 02/03/2009  . Hyperlipidemia associated with type 2 diabetes mellitus ([place]) 06/14/2007  . Hypertension associated with diabetes ([place]) 06/14/2007  . GERD 06/14/2007    Current Outpatient Medications:  .  acetaminophen (TYLENOL) 500 MG tablet, Take 1,000 mg by mouth every 8 (eight) hours as needed for moderate pain. , Disp: , Rfl:  .  ALPRAZolam (XANAX) 0.5 MG tablet, TAKE 1/2 TABLET TWICE DAILY AS NEEDED FOR ANXIETY. (Patient taking  differently: Take 0.25 mg by mouth twice daily as needed for anxiety), Disp: 60 tablet, Rfl: 5 .  amiodarone (PACERONE) 100 MG tablet, Take 1 tablet (100 mg total) by mouth daily., Disp: 30 tablet, Rfl: 3 .  atorvastatin (LIPITOR) 80 MG tablet, Take 1 tablet (80 mg total) by mouth daily., Disp: 30 tablet, Rfl: 3 .  carvedilol (COREG) 12.5 MG tablet, TAKE 1 TABLET TWICE DAILY., Disp: 60 tablet, Rfl: 3 .  cilostazol (PLETAL) 100 MG tablet, TAKE 1 TABLET TWICE DAILY., Disp: 180 tablet, Rfl: 3 .  ELIQUIS 2.5 MG TABS tablet, TAKE 1 TABLET BY MOUTH TWICE DAILY., Disp: 60 tablet, Rfl: 11 .  ezetimibe (ZETIA) 10 MG tablet, TAKE (1) TABLET DAILY., Disp: 90 tablet, Rfl: 0 .  FLUoxetine (PROZAC) 20 MG capsule, TAKE (1) CAPSULE DAILY., Disp: 90 capsule, Rfl: 0 .  furosemide (LASIX) 80 MG tablet, Take 120mg  (1 AND 1/2 tablets)  in the AM and 80mg  (1 tablet)  in the PM, Disp: 75 tablet, Rfl: 6 .  glucose blood (ACCU-CHEK AVIVA PLUS) test strip, CHECK BLOOD SUGAR 4 TIMES A DAY., Disp: 100 each, Rfl: 11 .  hydrALAZINE (APRESOLINE) 25 MG tablet, Take 1 tablet (25 mg total) by mouth 3 (three) times daily. D/c order for Losartan., Disp: 90 tablet, Rfl: 3 .  HYDROcodone-acetaminophen (NORCO/VICODIN) 5-325 MG tablet, Take 1 tablet by mouth every 4 (four) hours as needed for moderate pain., Disp: 24 tablet, Rfl: 0 .  Investigational - Study Medication, Take 1 tablet by mouth daily. Study name: [place]  Additional study details: MK-1242 (vericiguat) or Placebo, Disp: 1 each, Rfl: PRN .  Lancets MISC, Check blood sugar 4 times  a day, Disp: 100 each, Rfl: 11 .  levothyroxine (SYNTHROID, LEVOTHROID) 75 MCG tablet, Take 1 tablet (75 mcg total) by mouth daily before breakfast., Disp: 30 tablet, Rfl: 3 .  Multiple Vitamin (MULTIVITAMIN) tablet, Take 1 tablet by mouth daily., Disp: , Rfl:  .  potassium chloride 20 MEQ TBCR, Take 20 mEq by mouth daily., Disp: 30 tablet, Rfl: 3 .  tiotropium (SPIRIVA HANDIHALER) 18 MCG inhalation  capsule, Place 1 capsule (18 mcg total) into inhaler and inhale daily., Disp: 30 capsule, Rfl: 12 .  NITROSTAT 0.4 MG SL tablet, DISSOLVE 1 TABLET UNDER TONGUE AS NEEDED FOR CHEST PAIN,MAY REPEAT IN5 MINUTES FOR 2 DOSES. (Patient not taking: Reported on 02/27/2018), Disp: 25 tablet, Rfl: 6 .  temazepam (RESTORIL) 30 MG capsule, TAKE 1 CAPSULE AT BEDTIME AS NEEDED FOR SLEEP. (Patient not taking: Reported on 03/06/2018), Disp: 30 capsule, Rfl: 0 .  traMADol (ULTRAM) 50 MG tablet, Take 1 tablet (50 mg total) by mouth every 6 (six) hours as needed. (Patient not taking: Reported on 03/06/2018), Disp: 12 tablet, Rfl: 0 Allergies  Allergen Reactions  . Metoprolol Shortness Of Breath, Palpitations and Other (See Comments)    Heart starts racing. Shallow breathing   . Metformin And Related Nausea And Vomiting      Social History   Socioeconomic History  . Marital status: Married    Spouse name: Not on file  . Number of children: Not on file  . Years of education: Not on file  . Highest education level: Not on file  Social Needs  . Financial resource strain: Not on file  . Food insecurity - worry: Not on file  . Food insecurity - inability: Not on file  . Transportation needs - medical: Not on file  . Transportation needs - non-medical: Not on file  Occupational History  . Occupation: Retired  Tobacco Use  . Smoking status: Former Smoker    Packs/day: 1.50    Years: 35.00    Pack years: 52.50    Types: Cigarettes  . Smokeless tobacco: Never Used  . Tobacco comment: quit atleast 25 yrs ago, per pt  Substance and Sexual Activity  . Alcohol use: Yes    Alcohol/week: 1.8 oz    Types: 3 Shots of liquor per week    Comment: socially  . Drug use: No  . Sexual activity: Yes  Other Topics Concern  . Not on file  Social History Narrative   Pt lives in [place] with spouse.   Retired from [employer].  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     Physical Exam      Future Appointments  Date Time Provider [place]  03/12/2018 10:30 AM [facility] R/F 1 [facility]-DG   03/12/2018  1:40 PM [name], MD [facility] PEC  04/03/2018  3:20 PM Larey Dresser, MD [facility] None  04/10/2018  3:00 PM Gardiner Barefoot, DPM [facility]  04/11/2018 11:00 AM [place] 1 [facility] None  04/19/2018 10:00 AM [facility] LAB [facility] None  05/02/2018 10:00 AM [facility] RM [facility] None  05/02/2018 11:00 AM Juanito Doom, MD [facility] None  07/06/2018  2:00 PM [facility]-CV HS VASC 4 [facility]-H[facility]I VVS  07/06/2018  2:45 PM Nickel, Sharmon Leyden, NP [facility]    BP 140/70   Pulse 70   Resp 14   Wt 196 lb (88.9 kg)   SpO2 94%   BMI 30.70 kg/m   Weight yesterday-195 Last visit  weight-191 CBG EMS-168  Pt reports feeling well overall, some sob, a couple missed doses of his meds.  meds verified and pill box refilled. His weight is up 5lbs from last visit, he states he ate spaghetti last night for dinner. He has cut back today on his food and fluids. He has swelling to his rt leg, his left leg appears normal. He has been keeping his legs elevated today. He denies any increased sob, no increased swelling to his abd. Spoke to clinic and susie advised that per amy to increase his lasix  120mg  bid for 2days so that change was made in his pill box. B/p slightly elevated but it is time for him to take the afternoon dose of meds.   Andrew [name], Andrew [name]  03/06/18

## 2018-03-06 NOTE — Telephone Encounter (Signed)
Advanced Heart Failure Triage Encounter  Patient Name: Andrew [name]  Date of Call: 03/06/18  Problem: wt gain  Joellen Jersey called to report patient has had a 5 lb weight gain overnight.  No other symptoms reported.   Plan:  Spoke with Darrick Grinder, NP and she advised increasing lasix to 120 mg BID for 2 days only then resume previous dose.  Joellen Jersey is aware and will let patient know.   Darron Doom, RN

## 2018-03-12 ENCOUNTER — Ambulatory Visit (HOSPITAL_COMMUNITY)
Admission: RE | Admit: 2018-03-12 | Discharge: 2018-03-12 | Disposition: A | Discharge status: Home / Self Care | Payer: Medicare Other | Source: Ambulatory Visit | Attending: Pulmonary Disease | Admitting: Pulmonary Disease

## 2018-03-12 ENCOUNTER — Ambulatory Visit: Payer: Medicare Other | Admitting: Family Medicine

## 2018-03-12 ENCOUNTER — Other Ambulatory Visit (INDEPENDENT_AMBULATORY_CARE_PROVIDER_SITE_OTHER): Payer: Medicare Other

## 2018-03-12 ENCOUNTER — Other Ambulatory Visit: Payer: Self-pay

## 2018-03-12 DIAGNOSIS — K228 Other specified diseases of esophagus: Secondary | ICD-10-CM | POA: Diagnosis not present

## 2018-03-12 DIAGNOSIS — E039 Hypothyroidism, unspecified: Secondary | ICD-10-CM

## 2018-03-12 DIAGNOSIS — J849 Interstitial pulmonary disease, unspecified: Secondary | ICD-10-CM | POA: Diagnosis not present

## 2018-03-12 LAB — TSH: TSH: 76.83 u[IU]/mL — AB (ref 0.35–4.50)

## 2018-03-13 ENCOUNTER — Other Ambulatory Visit: Payer: Self-pay

## 2018-03-13 ENCOUNTER — Other Ambulatory Visit (HOSPITAL_COMMUNITY): Payer: Self-pay

## 2018-03-13 DIAGNOSIS — E039 Hypothyroidism, unspecified: Secondary | ICD-10-CM

## 2018-03-13 MED ORDER — LEVOTHYROXINE SODIUM 100 MCG PO TABS: 100.0000 ug | ORAL_TABLET | Freq: Every day | ORAL | 3 refills | Status: DC

## 2018-03-13 NOTE — Progress Notes (Signed)
Spoke with pt and made appointment on 04/11/2018 for Lab.

## 2018-03-13 NOTE — Progress Notes (Signed)
Paramedicine Encounter    Patient ID: Andrew [name], male    DOB: 1928/06/23, 84 y.o.   MRN: [id number]   Patient Care Team: [name], MD as PCP - General (Family Medicine) Larey Dresser, MD as Consulting Physician (Cardiology)  Patient Active Problem List   Diagnosis Date Noted  . Bilateral recurrent inguinal hernias 11/28/2017  . Calf pain 05/25/2016  . Carotid artery stenosis 01/12/2016  . Chronic systolic CHF (congestive heart failure) ([place]) 12/02/2015  . Cerebellar infarct ([place]) 10/16/2015  . 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 ([place]) 10/16/2015  . PVD (peripheral vascular disease) ([place])   . Hereditary and idiopathic peripheral neuropathy 10/02/2015  . Peripelvic (lymphatic) cyst   . Pernicious anemia 07/15/2015  . Bilateral renal cysts 07/12/2015  . Lung nodule, 69mm RML CT 07/12/15 07/12/2015  . Constipation 07/12/2015  . Kidney stone on right side 07/12/2015  . Stage 3 chronic kidney disease ([place]) 06/14/2015  . Chronic diastolic CHF (congestive heart failure) ([place]) 05/28/2015  . Paroxysmal atrial fibrillation ([place]) 05/28/2015  . AAA (abdominal aortic aneurysm) without rupture ([place]) 09/30/2014  . Left-sided low back pain without sciatica 07/21/2014  . Cardiomyopathy, ischemic 05/23/2014  . DM (diabetes mellitus), type 2 with renal complications ([place]) 46/65/9935  . CORONARY ATHEROSCLEROSIS NATIVE CORONARY ARTERY 11/09/2010  . Depression, major, single episode, complete remission ([place]) 03/18/2010  . Hypothyroidism 02/03/2009  . Hyperlipidemia associated with type 2 diabetes mellitus ([place]) 06/14/2007  . Hypertension associated with diabetes ([place]) 06/14/2007  . GERD 06/14/2007    Current Outpatient Medications:  .  acetaminophen (TYLENOL) 500 MG tablet, Take 1,000 mg by mouth every 8 (eight) hours as needed for moderate pain. , Disp: , Rfl:  .  ALPRAZolam (XANAX) 0.5 MG tablet, TAKE 1/2 TABLET TWICE DAILY AS NEEDED FOR ANXIETY. (Patient taking  differently: Take 0.25 mg by mouth twice daily as needed for anxiety), Disp: 60 tablet, Rfl: 5 .  amiodarone (PACERONE) 100 MG tablet, Take 1 tablet (100 mg total) by mouth daily., Disp: 30 tablet, Rfl: 3 .  atorvastatin (LIPITOR) 80 MG tablet, Take 1 tablet (80 mg total) by mouth daily., Disp: 30 tablet, Rfl: 3 .  carvedilol (COREG) 12.5 MG tablet, TAKE 1 TABLET BY MOUTH TWICE DAILY., Disp: 60 tablet, Rfl: 3 .  cilostazol (PLETAL) 100 MG tablet, TAKE 1 TABLET TWICE DAILY., Disp: 180 tablet, Rfl: 3 .  ELIQUIS 2.5 MG TABS tablet, TAKE 1 TABLET BY MOUTH TWICE DAILY., Disp: 60 tablet, Rfl: 11 .  ezetimibe (ZETIA) 10 MG tablet, TAKE (1) TABLET DAILY., Disp: 90 tablet, Rfl: 0 .  FLUoxetine (PROZAC) 20 MG capsule, TAKE (1) CAPSULE DAILY., Disp: 90 capsule, Rfl: 0 .  furosemide (LASIX) 80 MG tablet, Take 120mg  (1 AND 1/2 tablets)  in the AM and 80mg  (1 tablet)  in the PM, Disp: 75 tablet, Rfl: 6 .  glucose blood (ACCU-CHEK AVIVA PLUS) test strip, CHECK BLOOD SUGAR 4 TIMES A DAY., Disp: 100 each, Rfl: 11 .  hydrALAZINE (APRESOLINE) 25 MG tablet, Take 1 tablet (25 mg total) by mouth 3 (three) times daily. D/c order for Losartan., Disp: 90 tablet, Rfl: 3 .  HYDROcodone-acetaminophen (NORCO/VICODIN) 5-325 MG tablet, Take 1 tablet by mouth every 4 (four) hours as needed for moderate pain., Disp: 24 tablet, Rfl: 0 .  Investigational - Study Medication, Take 1 tablet by mouth daily. Study name: [place]  Additional study details: MK-1242 (vericiguat) or Placebo, Disp: 1 each, Rfl: PRN .  Lancets MISC, Check blood sugar  4 times a day, Disp: 100 each, Rfl: 11 .  levothyroxine (SYNTHROID, LEVOTHROID) 100 MCG tablet, Take 1 tablet (100 mcg total) by mouth daily., Disp: 30 tablet, Rfl: 3 .  Multiple Vitamin (MULTIVITAMIN) tablet, Take 1 tablet by mouth daily., Disp: , Rfl:  .  potassium chloride 20 MEQ TBCR, Take 20 mEq by mouth daily., Disp: 30 tablet, Rfl: 3 .  tiotropium (SPIRIVA HANDIHALER) 18 MCG inhalation  capsule, Place 1 capsule (18 mcg total) into inhaler and inhale daily., Disp: 30 capsule, Rfl: 12 .  traMADol (ULTRAM) 50 MG tablet, Take 1 tablet (50 mg total) by mouth every 6 (six) hours as needed., Disp: 12 tablet, Rfl: 0 .  NITROSTAT 0.4 MG SL tablet, DISSOLVE 1 TABLET UNDER TONGUE AS NEEDED FOR CHEST PAIN,MAY REPEAT IN5 MINUTES FOR 2 DOSES. (Patient not taking: Reported on 02/27/2018), Disp: 25 tablet, Rfl: 6 .  temazepam (RESTORIL) 30 MG capsule, TAKE 1 CAPSULE AT BEDTIME AS NEEDED FOR SLEEP. (Patient not taking: Reported on 03/06/2018), Disp: 30 capsule, Rfl: 0 Allergies  Allergen Reactions  . Metoprolol Shortness Of Breath, Palpitations and Other (See Comments)    Heart starts racing. Shallow breathing   . Metformin And Related Nausea And Vomiting      Social History   Socioeconomic History  . Marital status: Married    Spouse name: Not on file  . Number of children: Not on file  . Years of education: Not on file  . Highest education level: Not on file  Social Needs  . Financial resource strain: Not on file  . Food insecurity - worry: Not on file  . Food insecurity - inability: Not on file  . Transportation needs - medical: Not on file  . Transportation needs - non-medical: Not on file  Occupational History  . Occupation: Retired  Tobacco Use  . Smoking status: Former Smoker    Packs/day: 1.50    Years: 35.00    Pack years: 52.50    Types: Cigarettes  . Smokeless tobacco: Never Used  . Tobacco comment: quit atleast 25 yrs ago, per pt  Substance and Sexual Activity  . Alcohol use: Yes    Alcohol/week: 1.8 oz    Types: 3 Shots of liquor per week    Comment: socially  . Drug use: No  . Sexual activity: Yes  Other Topics Concern  . Not on file  Social History Narrative   Pt lives in [place] with spouse.   Retired from [employer].  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    Physical Exam      Future  Appointments  Date Time Provider [place]  03/14/2018  2:30 PM [facility] PA/NP [facility] None  04/03/2018  3:20 PM Larey Dresser, MD [facility] None  04/10/2018  3:00 PM Gardiner Barefoot, DPM [facility]  04/11/2018  9:00 AM [facility] LAB [facility] PEC  04/11/2018 11:00 AM [place] 1 [facility] None  04/19/2018 10:00 AM [facility] LAB [facility] None  05/02/2018 10:00 AM [facility]PFT RM [facility] None  05/02/2018 11:00 AM Juanito Doom, MD [facility] None  07/06/2018  2:00 PM [facility] HS VASC 4 [facility]I VVS  07/06/2018  2:45 PM Nickel, Sharmon Leyden, NP [facility]    BP 132/68   Pulse 78   Resp 16   Wt 196 lb (88.9 kg)   SpO2 93%   BMI 30.70 kg/m   Weight yesterday-197 Last visit weight-196 CBG EMS-76  Pt reports he is feeling  fine, he has a new Rx for increased dose of levothyroxine to start today. He has missed 2 afternoon doses of his meds and 2 evening doses of meds this week. He says he forgets at times. meds verified and pill box refilled. He has clinic appoint tomor and will see him there.  Wife reports his b/p was elevated this morning.  No change in the extra lasix dosing last week with weights.  Compared CBG readings to his machine and mine and his was 76 and mine was 335-my machine was flashing a temp gauge so im sure mine was inaccurate and that I will correct. So for now we will go with what his read.   Marylouise Stacks, [place] [facility] Paramedic  03/13/18

## 2018-03-14 ENCOUNTER — Encounter (HOSPITAL_COMMUNITY): Payer: Self-pay

## 2018-03-14 ENCOUNTER — Ambulatory Visit (HOSPITAL_COMMUNITY)
Admission: RE | Admit: 2018-03-14 | Discharge: 2018-03-14 | Disposition: A | Discharge status: Home / Self Care | Payer: Medicare Other | Source: Ambulatory Visit | Attending: Cardiology | Admitting: Cardiology

## 2018-03-14 ENCOUNTER — Other Ambulatory Visit (HOSPITAL_COMMUNITY): Payer: Self-pay

## 2018-03-14 VITALS — BP 128/76 | HR 78 | Wt 203.4 lb

## 2018-03-14 DIAGNOSIS — E039 Hypothyroidism, unspecified: Secondary | ICD-10-CM | POA: Diagnosis not present

## 2018-03-14 DIAGNOSIS — K219 Gastro-esophageal reflux disease without esophagitis: Secondary | ICD-10-CM | POA: Insufficient documentation

## 2018-03-14 DIAGNOSIS — I5022 Chronic systolic (congestive) heart failure: Secondary | ICD-10-CM | POA: Insufficient documentation

## 2018-03-14 DIAGNOSIS — I2582 Chronic total occlusion of coronary artery: Secondary | ICD-10-CM | POA: Insufficient documentation

## 2018-03-14 DIAGNOSIS — I2510 Atherosclerotic heart disease of native coronary artery without angina pectoris: Secondary | ICD-10-CM | POA: Insufficient documentation

## 2018-03-14 DIAGNOSIS — I6529 Occlusion and stenosis of unspecified carotid artery: Secondary | ICD-10-CM | POA: Insufficient documentation

## 2018-03-14 DIAGNOSIS — Z951 Presence of aortocoronary bypass graft: Secondary | ICD-10-CM | POA: Insufficient documentation

## 2018-03-14 DIAGNOSIS — I5032 Chronic diastolic (congestive) heart failure: Secondary | ICD-10-CM

## 2018-03-14 DIAGNOSIS — I252 Old myocardial infarction: Secondary | ICD-10-CM | POA: Insufficient documentation

## 2018-03-14 DIAGNOSIS — E1159 Type 2 diabetes mellitus with other circulatory complications: Secondary | ICD-10-CM | POA: Diagnosis not present

## 2018-03-14 DIAGNOSIS — J479 Bronchiectasis, uncomplicated: Secondary | ICD-10-CM | POA: Diagnosis not present

## 2018-03-14 DIAGNOSIS — I480 Paroxysmal atrial fibrillation: Secondary | ICD-10-CM | POA: Diagnosis not present

## 2018-03-14 DIAGNOSIS — K589 Irritable bowel syndrome without diarrhea: Secondary | ICD-10-CM | POA: Insufficient documentation

## 2018-03-14 DIAGNOSIS — E785 Hyperlipidemia, unspecified: Secondary | ICD-10-CM | POA: Insufficient documentation

## 2018-03-14 DIAGNOSIS — N183 Chronic kidney disease, stage 3 unspecified: Secondary | ICD-10-CM

## 2018-03-14 DIAGNOSIS — R911 Solitary pulmonary nodule: Secondary | ICD-10-CM | POA: Diagnosis not present

## 2018-03-14 DIAGNOSIS — I255 Ischemic cardiomyopathy: Secondary | ICD-10-CM | POA: Insufficient documentation

## 2018-03-14 DIAGNOSIS — I6521 Occlusion and stenosis of right carotid artery: Secondary | ICD-10-CM | POA: Diagnosis not present

## 2018-03-14 DIAGNOSIS — I714 Abdominal aortic aneurysm, without rupture: Secondary | ICD-10-CM | POA: Insufficient documentation

## 2018-03-14 DIAGNOSIS — Z7901 Long term (current) use of anticoagulants: Secondary | ICD-10-CM | POA: Insufficient documentation

## 2018-03-14 DIAGNOSIS — I152 Hypertension secondary to endocrine disorders: Secondary | ICD-10-CM

## 2018-03-14 DIAGNOSIS — I10 Essential (primary) hypertension: Secondary | ICD-10-CM | POA: Insufficient documentation

## 2018-03-14 DIAGNOSIS — E1122 Type 2 diabetes mellitus with diabetic chronic kidney disease: Secondary | ICD-10-CM | POA: Insufficient documentation

## 2018-03-14 DIAGNOSIS — Z85820 Personal history of malignant melanoma of skin: Secondary | ICD-10-CM | POA: Diagnosis not present

## 2018-03-14 DIAGNOSIS — Z87891 Personal history of nicotine dependence: Secondary | ICD-10-CM | POA: Insufficient documentation

## 2018-03-14 DIAGNOSIS — Z9111 Patient's noncompliance with dietary regimen: Secondary | ICD-10-CM | POA: Diagnosis not present

## 2018-03-14 DIAGNOSIS — Z79891 Long term (current) use of opiate analgesic: Secondary | ICD-10-CM | POA: Insufficient documentation

## 2018-03-14 DIAGNOSIS — Z8249 Family history of ischemic heart disease and other diseases of the circulatory system: Secondary | ICD-10-CM | POA: Insufficient documentation

## 2018-03-14 DIAGNOSIS — I4892 Unspecified atrial flutter: Secondary | ICD-10-CM | POA: Diagnosis not present

## 2018-03-14 DIAGNOSIS — Z79899 Other long term (current) drug therapy: Secondary | ICD-10-CM | POA: Insufficient documentation

## 2018-03-14 DIAGNOSIS — Z8673 Personal history of transient ischemic attack (TIA), and cerebral infarction without residual deficits: Secondary | ICD-10-CM | POA: Diagnosis not present

## 2018-03-14 DIAGNOSIS — E1151 Type 2 diabetes mellitus with diabetic peripheral angiopathy without gangrene: Secondary | ICD-10-CM | POA: Diagnosis not present

## 2018-03-14 LAB — BASIC METABOLIC PANEL
ANION GAP: 11 (ref 5–15)
BUN: 33 mg/dL — ABNORMAL HIGH (ref 6–20)
CO2: 28 mmol/L (ref 22–32)
Calcium: 8.8 mg/dL — ABNORMAL LOW (ref 8.9–10.3)
Chloride: 101 mmol/L (ref 101–111)
Creatinine, Ser: 1.54 mg/dL — ABNORMAL HIGH (ref 0.61–1.24)
GFR, EST AFRICAN AMERICAN: 45 mL/min — AB (ref >60)
GFR, EST NON AFRICAN AMERICAN: 39 mL/min — AB (ref >60)
Glucose, Bld: 104 mg/dL — ABNORMAL HIGH (ref 65–99)
Potassium: 3.8 mmol/L (ref 3.5–5.1)
Sodium: 140 mmol/L (ref 135–145)

## 2018-03-14 NOTE — Patient Instructions (Addendum)
Routine lab work today. Will notify you of abnormal results, otherwise no news is good news!  Take lasix 120 mg (1.5 tabs) twice daily for TWO DAYS. Then reduce back to normal daily dose of 120 mg (1.5 tabs) in am and 80 mg (1 tab) in pm.  Follow up as scheduled.  Take all medication as prescribed the day of your appointment. Bring all medications with you to your appointment.  Do the following things EVERYDAY: 1) Weigh yourself in the morning before breakfast. Write it down and keep it in a log. 2) Take your medicines as prescribed 3) Eat low salt foods-Limit salt (sodium) to 2000 mg per day.  4) Stay as active as you can everyday 5) Limit all fluids for the day to less than 2 liters

## 2018-03-14 NOTE — Progress Notes (Signed)
Patient ID: Andrew [name], male   DOB: 06-19-1928, 84 y.o.   MRN: [id number] PCP: Dr. Jerline Pain Cardiology: Dr. Christella Noa is a 84 y.o. male with history of CKD, CAD s/p CABG, and ischemic cardiomyopathy with primarily diastolic CHF presents for followup of CHF and CAD.  He has a history of PAD followed at [facility].  I took him for [facility] in 11/15. 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  He had had occlusion of a relatively small LAD, there were right to left collaterals and no intervention was done (medical management).    He and his wife went on a cruise along the [employer] in 5/16.  He admits to considerable dietary indiscretion (high sodium diet).  It appears that he developed a hypertensive crisis along with chest pain while on the ship. He went into atrial fibrillation and developed CHF.  He was taken to the hospital in [place], [place].  He was in the ICU for about a week on Bipap intermittently.  Troponin peaked at 0.09 during this admission.  He was diuresed and after about 2 wks left the hospital and was able to fly home.    Cardiolite (6/16) showed EF 42%, prior anterolateral MI, no ischemia. Echo in 10/16 with EF 40-45%.    He was admitted on 07/06/15 for RLQ pain and fever.  He was treated for UTI and had a kidney stone as well.  He developed SOB after IVF were given for AKI.  He was treated for acute COPD exacerbation as well as CHF decompensation.  He was diuresed with IV lasix.  His weight on admission was 179 and got as high as 188 after IVF. Discharge weight was 181.  He was admitted in 10/16 with a cerebellar CVA.  He has some resultant imbalance.   He was again admitted in 11/16 with acute on chronic systolic CHF.  This was a short admission that appeared to be precipitated by a sodium load from eating at a [place] [employer] .   3/18 Echo showed stable EF 40-45%.  He was also having chest pain so I did a Cardiolite in 3/18, no ischemia.   He was admitted in 11/18  for elective hernia repair. Post-op, he developed hypoxemia and had a rehab stay.  He is now home and off oxygen.   Creatinine increased to 2.97 in 1/19.  I cut back on losartan and Lasix.  He developed increased lower extremity edema and we increased Lasix back to 120 qam/80 qpm.   He presents today for add on due to elevated BP and edema. Called our office yesterday with solitary BP measurement in 190s. Repeat was normal, and Paramedicine follow up later in the day had normal BP.  He also c/o swelling. He has a scratch on his leg from his dog, that has prevented him from donning his compression hose. Weight up 6 lbs by our scales. He is eating salty foods and drinking > 2L daily. He denies lightheadedness or dizziness. He is SOB after walking short distances. Uses a cane for balance.   ECG: NSR, 1st degree AVB, IVCD 136 msec (personally reviewed).   Labs (12/14): K 4.8, creatinine 1.5 Labs (5/15): LDL 92 Labs (11/15): K 4.1, creatinine 1.5 Labs (12/15): K 4, creatinine 1.5 Labs (1/16): LFTs normal, LDL 51, HDL 47 Labs (5/16): TnI 0.09 Labs (6/16): K 4.8 => 4.4, creatinine 1.79 => 2.26, HCT 28.7 Labs (7/16) K 4.2, creatinine 1.98, LFTs normal,  TSH normal Labs (8/16): HCT 27.1 Labs (9/16): hgb 8.1, K 4.3, creatinine 1.8, LFTs normal, TSH elevated Labs (10/16): LDL 74, HDL 45, TSH 5.19 (increased), free T3 and free T4 normal.  Labs (11/16): K 3.5, creatinine 1.47, AST 84, ALT 89 Labs (12/16): K 4.3, creatinine 1.58, AST 43, ALT 42 Labs (3/17): K 4.2, creatinine 1.4, hgb 10.7, AST 36, ALT 45 Labs (6/17): K 4, creatinine 1.69, hgb 10.9, proBNP 389, TSH mildly elevated Labs (8/17): Free T4 and T3 normal, TSH mildly elevated, K 4.2, creatinine 1.7, LFTs normal, hgb 10.4 Labs (12/17): TSH elevated but free T3 and T4 normal, K 4.5, creatinine 1.9, LDL 45, HDL 39, LFTs normal, HCT 32.9 Labs (1/18): K 4.4, creatinine 1.93 Labs (3/18): K 4.3, creatinine 1.87, BNP 187, hgb 11.1, LDL 75, LFTs  normal Labs (4/18): K 4.1, creatinine 1.7 Labs (9/18): K 4, creatinine 1.8, hgb 11.9 Labs (10/18): free T4 0.4, free T3 1.9, TSH 139  Labs (12/18): K 3.4, creatinine 1.76, hgb 12.2 Labs (1/19): K 4 => 4.9, creatinine 2.7 => 2.97, TSH 179, LFTs normal Labs (2/19): K 4.6, creatinine 2.19  PMH: 1. CKD stage 3 2. HTN 3. Hyperlipidemia 4. AAA: s/p surgery with left renal artery bypass in 1997 - Abdominal US 10/18 with 3.2 AAA 5. GERD 6. IBS 7. Melanoma s/p reception 8. Diabetes: Diet-controlled 9. Carotid stenosis: Carotid dopplers (4/15) with 40-59% bilateral stenosis. Carotid dopplers (7/16) with < 40% BICA stenosis.  Carotid dopplers (10/16) with 40-59% BICA stenosis.  - Carotids (8/17) with minimal disease.  - Carotids (10/18) with 1-39% BICA stenosis.  - Carotids (1/19): 1-39% BICA stenosis 10. PAD: 9/14 ABIs 0.91 right 1.09 left.  7/16 ABIs 0.86 right, 1.1 left; aortoiliac duplex with < 50% bilateral iliac stenosis.  - Aortoiliac duplex (8/17) with < 50% bilateral iliac stenosis.   - ABIs (10/17): left 0.91, right 0.98, left TBI 0.44 (abnormal).  - ABIs (7/18): right 0.97, right 0.75 - ABIs (1/19): right 1.04, left 1.03 11. CAD: Has super-dominant RCA. s/p CABG 1992.  He had Taxus DES to mid PDA in 5/02. LHC (1/05) with 90% ostial PDA (had DES to this vessel), totally occluded RIMA-PDA, totally occluded PLV, sequential SVG-PLV with 1 branch occluded.  LHC (11/15) with left main off the right cusp, 40-50% ostial left main, total occlusion of the proximal LAD (small vessel) with right to left collaterals, large super-dominant RCA with 50% ISR mid RCA, 50% ostial PDA, patent SVG-PLV, RIMA-PDA known atretic. Lexiscan Cardiolite (6/16) with EF 42%, prior anterolateral MI, no ischemia.  - Lexiscan Cardiolite (3/18): EF 51%, basal to mid anterolateral fixed defect with no ischemia.  12. Ischemic cardiomyopathy: Echo (12/09) with EF 45-50%, basal to mid inferolateral hypokinesis. Echo (6/15)  with EF 45-50%, akinesis of the mid to apical inferolateral wall.  Echo (6/16) with EF 50-55%, mild LVH, inferior hypokinesis, mild MR.  Echo (10/16) with EF 40-45%, moderate LVH, mildly decreased RV systolic function.  - Echo (3/18) with EF 40-45%, mid anterior hypokinesis.  13. OA 14. Atrial fibrillation/flutter: Paroxysmal.  Noted during 5/16 hospitalization in [place] and in 6/16.  He developed atrial flutter in 8/16, back in NSR by 9/16.  15. Emphysema: PFTs (8/16) with FVC 88%, FEV1 74%, ratio 81%, TLC 83%, DLCO 31%. - PFTs (12/18): FVC 64%, FEV1 69%, ratio 105%, TLC 82%, DLCO 33% => mild obstruction, severely decreased DLCO (similar to prior).   16. Renal cysts 17. Idiopathic peripheral neuropathy.  18. Anemia.  2. CVA: 10/16, cerebellar  CVA.  20. Hernia repair (11/18) 21. Hypothyroidism 22. Bronchiectasis: CT chest 1/19 with bronchiectasis.  23. Lung nodule: 1.3 cm RML nodule on 1/19 CT, was noted in 2014 => slow growing.   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  FH: Brother with CAD.   Review of systems complete and found to be negative unless listed in HPI.    Current Outpatient Medications  Medication Sig Dispense Refill  . acetaminophen (TYLENOL) 500 MG tablet Take 1,000 mg by mouth every 8 (eight) hours as needed for moderate pain.     Marland Kitchen ALPRAZolam (XANAX) 0.5 MG tablet TAKE 1/2 TABLET TWICE DAILY AS NEEDED FOR ANXIETY. (Patient taking differently: Take 0.25 mg by mouth twice daily as needed for anxiety) 60 tablet 5  . amiodarone (PACERONE) 100 MG tablet Take 1 tablet (100 mg total) by mouth daily. 30 tablet 3  . atorvastatin (LIPITOR) 80 MG tablet Take 1 tablet (80 mg total) by mouth daily. 30 tablet 3  . carvedilol (COREG) 12.5 MG tablet TAKE 1 TABLET BY MOUTH TWICE DAILY. 60 tablet 3  . cilostazol (PLETAL) 100 MG tablet TAKE 1 TABLET TWICE DAILY. 180 tablet 3  . ELIQUIS 2.5 MG TABS tablet TAKE 1 TABLET BY MOUTH  TWICE DAILY. 60 tablet 11  . ezetimibe (ZETIA) 10 MG tablet TAKE (1) TABLET DAILY. 90 tablet 0  . FLUoxetine (PROZAC) 20 MG capsule TAKE (1) CAPSULE DAILY. 90 capsule 0  . furosemide (LASIX) 80 MG tablet Take 120mg  (1 AND 1/2 tablets)  in the AM and 80mg  (1 tablet)  in the PM 75 tablet 6  . glucose blood (ACCU-CHEK AVIVA PLUS) test strip CHECK BLOOD SUGAR 4 TIMES A DAY. 100 each 11  . hydrALAZINE (APRESOLINE) 25 MG tablet Take 1 tablet (25 mg total) by mouth 3 (three) times daily. D/c order for Losartan. 90 tablet 3  . HYDROcodone-acetaminophen (NORCO/VICODIN) 5-325 MG tablet Take 1 tablet by mouth every 4 (four) hours as needed for moderate pain. 24 tablet 0  . Investigational - Study Medication Take 1 tablet by mouth daily. Study name: [place]  Additional study details: MK-1242 (vericiguat) or Placebo 1 each PRN  . Lancets MISC Check blood sugar 4 times a day 100 each 11  . levothyroxine (SYNTHROID, LEVOTHROID) 100 MCG tablet Take 1 tablet (100 mcg total) by mouth daily. 30 tablet 3  . Multiple Vitamin (MULTIVITAMIN) tablet Take 1 tablet by mouth daily.    Marland Kitchen NITROSTAT 0.4 MG SL tablet DISSOLVE 1 TABLET UNDER TONGUE AS NEEDED FOR CHEST PAIN,MAY REPEAT IN5 MINUTES FOR 2 DOSES. 25 tablet 6  . potassium chloride 20 MEQ TBCR Take 20 mEq by mouth daily. 30 tablet 3  . temazepam (RESTORIL) 30 MG capsule TAKE 1 CAPSULE AT BEDTIME AS NEEDED FOR SLEEP. 30 capsule 0  . tiotropium (SPIRIVA HANDIHALER) 18 MCG inhalation capsule Place 1 capsule (18 mcg total) into inhaler and inhale daily. 30 capsule 12  . traMADol (ULTRAM) 50 MG tablet Take 1 tablet (50 mg total) by mouth every 6 (six) hours as needed. 12 tablet 0   No current facility-administered medications for this encounter.    Vitals:   03/14/18 1438  BP: 128/76  Pulse: 78  SpO2: 93%  Weight: 203 lb 6.4 oz (92.3 kg)   Wt Readings from Last 3 Encounters:  03/14/18 203 lb 6.4 oz (92.3 kg)  03/13/18 196 lb (88.9 kg)  03/06/18 196 lb (88.9 kg)     Physical Exam  General: Elderly appearing. No resp difficulty. HEENT: Normal Neck: Supple. JVP 8-9 cm. Carotids 2+ bilat; no bruits. No thyromegaly or nodule noted. Cor: PMI nondisplaced. RRR, No M/G/R noted Lungs: Slightly diminished basilar sounds with scattered crackles.  Abdomen: Soft, non-tender, non-distended, no HSM. No bruits or masses. +BS  Extremities: No cyanosis, clubbing, or rash. BLE with chronic venous stasis changes and 1-2+ edema.  Neuro: Alert & orientedx3, cranial nerves grossly intact. moves all 4 extremities w/o difficulty. Affect pleasant   Assessment/Plan:  1. CAD: s/p CABG.  Cath in 11/15 showed occluded LAD with left to right collaterals.  The LAD was a relatively small vessel (super-dominant right).  Managed medically. Lexiscan Cardiolite in 6/16 with infarction but no ischemia.  Cardiolite 3/18 with infarction, no ischemia.  - No s/s of ischemia.    - Continue ASA and statin.  2. Ischemic cardiomyopathy with systolic CHF: EF 66-44% on 3/18 echo.  - Echo 02/19/18 LVEF 60-65%, Grade 2 DD.  - NYHA III chronically.  - Volume status elevated on exam in setting of fluid and dietary non-compliance.  - Take lasix 120 mg BID x 2 days and then give lasix 120 mg q am/80 q pm. BMET today.  - Losartan stopped with AKI.  - Continue hydralazine 25 mg TID.  - No imdur on [place] study drug (vericiguat versus placebo).  - Continue Coreg 12.5 mg bid.    - Encouraged daily compression hose.  - Reinforced fluid restriction to < 2 L daily, sodium restriction to less than 2000 mg daily, and the importance of daily weights.   3. Carotid stenosis:  - Stable. Followed at [facility].  4. PAD: Stable claudication, ABIs with some improvement in 1/19.  No pedal ulcers.   - Follows at [facility]. Plan for medical management.  - Continue cilostazol, unable to come off due to worsening claudication when he stops it.  5. Hyperlipidemia: Good lipids in 3/18. No change.  6. CKD III:   - BMET today.   - Losartan stopped.  7. Atrial fibrillation/flutter:  Paroxysmal - Regular on exam.  - Continue amiodarone 100 mg daily. Will need to get regular eye exams with amiodarone use.  Normal LFTs in 1/19.  High resolution CT in 1/19 did not appear to show evidence for amiodarone lung toxicity.  - Continue Eliquis 2.5 bid.   8. Suspected OSA: - Cancelled sleep study due to mask discomfort.  9. COPD: He is now back off oxygen.  - Based on PFT abnormality with mild obstruction but severely decreased DLCO.  1/19 high resolution CT chest showed bronchiectasis.  - Follows with Dr. Lake Bells.  10. CVA: Cerebellar, 10/16.  He has had some resulting imbalance.  - He is no longer driving.  11. Hypertension:  - Pressure stable on hydralazine 25 mg TID.  12. Hypothyroidism: Likely related to amiodarone. -  levothyroxine and recently increased it.  TSH was still very high last checked.  - I will defer to PCP for further management.  13. Pulmonary nodule: 1.3 cm RML nodule on 1/19 CT chest.  It was also seen in 2014 => slow growing, think repeat CT 1 year is most appropriate.  He will see pulmonary soon and will get their opinion.      Doing well. Suspect that BP was an outlier. Volume status as above.   Shirley Friar, PA-C  03/14/2018  Greater than 50% of the 25 minute visit was spent in counseling/coordination of care regarding disease state education, salt/fluid restriction, sliding  scale diuretics, and medication compliance.

## 2018-03-14 NOTE — Progress Notes (Signed)
Paramedicine Encounter   Patient ID: Andrew [name] , male,   DOB: July 05, 1928,84 y.o.,  MRN: [id number]   Met patient in clinic today with provider.  Time spent with patient 80mn Today was a check in due to them getting a high b/p reading yesterday morning.  AJonni Sangerwants to increase his lasix for 2 days to 120 BID X 2 days.   Weight @ [place]-Paramedic 03/19/2018   ACTION: Home visit completed

## 2018-03-19 ENCOUNTER — Telehealth: Payer: Self-pay | Admitting: Family Medicine

## 2018-03-19 NOTE — Telephone Encounter (Signed)
Copied from [place] [id number]. Topic: Inquiry >> Mar 19, 2018  2:54 PM Oliver Pila B wrote: Reason for CRM: [facility] is having trouble w/ compliance  and w/ taking his blood sugars and monitoring, contact [facility] @ [phone]

## 2018-03-20 ENCOUNTER — Other Ambulatory Visit (HOSPITAL_COMMUNITY): Payer: Self-pay

## 2018-03-20 ENCOUNTER — Telehealth: Payer: Self-pay | Admitting: Family Medicine

## 2018-03-20 NOTE — Progress Notes (Signed)
Paramedicine Encounter    Patient ID: Andrew [name], male    DOB: [birth year], 84 y.o.   MRN: [id number]   Patient Care Team: [name], MD as PCP - General (Family Medicine) Larey Dresser, MD as Consulting Physician (Cardiology)  Patient Active Problem List   Diagnosis Date Noted  . Bilateral recurrent inguinal hernias 11/28/2017  . Calf pain 05/25/2016  . Carotid artery stenosis 01/12/2016  . Chronic systolic CHF (congestive heart failure) ([place]) 12/02/2015  . Cerebellar infarct ([place]) 10/16/2015  . 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 ([place]) 10/16/2015  . PVD (peripheral vascular disease) ([place])   . Hereditary and idiopathic peripheral neuropathy 10/02/2015  . Peripelvic (lymphatic) cyst   . Pernicious anemia 07/15/2015  . Bilateral renal cysts 07/12/2015  . Lung nodule, 38mm RML CT 07/12/15 07/12/2015  . Constipation 07/12/2015  . Kidney stone on right side 07/12/2015  . Stage 3 chronic kidney disease ([place]) 06/14/2015  . Chronic diastolic CHF (congestive heart failure) ([place]) 05/28/2015  . Paroxysmal atrial fibrillation ([place]) 05/28/2015  . AAA (abdominal aortic aneurysm) without rupture ([place]) 09/30/2014  . Left-sided low back pain without sciatica 07/21/2014  . Cardiomyopathy, ischemic 05/23/2014  . DM (diabetes mellitus), type 2 with renal complications ([place]) 40/34/7425  . CORONARY ATHEROSCLEROSIS NATIVE CORONARY ARTERY 11/09/2010  . Depression, major, single episode, complete remission ([place]) 03/18/2010  . Hypothyroidism 02/03/2009  . Hyperlipidemia associated with type 2 diabetes mellitus ([place]) 06/14/2007  . Hypertension associated with diabetes ([place]) 06/14/2007  . GERD 06/14/2007    Current Outpatient Medications:  .  acetaminophen (TYLENOL) 500 MG tablet, Take 1,000 mg by mouth every 8 (eight) hours as needed for moderate pain. , Disp: , Rfl:  .  ALPRAZolam (XANAX) 0.5 MG tablet, TAKE 1/2 TABLET TWICE DAILY AS NEEDED FOR ANXIETY. (Patient taking  differently: Take 0.25 mg by mouth twice daily as needed for anxiety), Disp: 60 tablet, Rfl: 5 .  amiodarone (PACERONE) 100 MG tablet, Take 1 tablet (100 mg total) by mouth daily., Disp: 30 tablet, Rfl: 3 .  atorvastatin (LIPITOR) 80 MG tablet, Take 1 tablet (80 mg total) by mouth daily., Disp: 30 tablet, Rfl: 3 .  carvedilol (COREG) 12.5 MG tablet, TAKE 1 TABLET BY MOUTH TWICE DAILY., Disp: 60 tablet, Rfl: 3 .  cilostazol (PLETAL) 100 MG tablet, TAKE 1 TABLET TWICE DAILY., Disp: 180 tablet, Rfl: 3 .  ELIQUIS 2.5 MG TABS tablet, TAKE 1 TABLET BY MOUTH TWICE DAILY., Disp: 60 tablet, Rfl: 11 .  ezetimibe (ZETIA) 10 MG tablet, TAKE (1) TABLET DAILY., Disp: 90 tablet, Rfl: 0 .  FLUoxetine (PROZAC) 20 MG capsule, TAKE (1) CAPSULE DAILY., Disp: 90 capsule, Rfl: 0 .  furosemide (LASIX) 80 MG tablet, Take 120mg  (1 AND 1/2 tablets)  in the AM and 80mg  (1 tablet)  in the PM, Disp: 75 tablet, Rfl: 6 .  hydrALAZINE (APRESOLINE) 25 MG tablet, Take 1 tablet (25 mg total) by mouth 3 (three) times daily. D/c order for Losartan., Disp: 90 tablet, Rfl: 3 .  Investigational - Study Medication, Take 1 tablet by mouth daily. Study name: [place]  Additional study details: MK-1242 (vericiguat) or Placebo, Disp: 1 each, Rfl: PRN .  levothyroxine (SYNTHROID, LEVOTHROID) 100 MCG tablet, Take 1 tablet (100 mcg total) by mouth daily., Disp: 30 tablet, Rfl: 3 .  Multiple Vitamin (MULTIVITAMIN) tablet, Take 1 tablet by mouth daily., Disp: , Rfl:  .  tiotropium (SPIRIVA HANDIHALER) 18 MCG inhalation capsule, Place 1 capsule (18 mcg total) into  inhaler and inhale daily., Disp: 30 capsule, Rfl: 12 .  glucose blood (ACCU-CHEK AVIVA PLUS) test strip, CHECK BLOOD SUGAR 4 TIMES A DAY. (Patient not taking: Reported on 03/20/2018), Disp: 100 each, Rfl: 11 .  HYDROcodone-acetaminophen (NORCO/VICODIN) 5-325 MG tablet, Take 1 tablet by mouth every 4 (four) hours as needed for moderate pain. (Patient not taking: Reported on 03/20/2018), Disp:  24 tablet, Rfl: 0 .  Lancets MISC, Check blood sugar 4 times a day (Patient not taking: Reported on 03/20/2018), Disp: 100 each, Rfl: 11 .  NITROSTAT 0.4 MG SL tablet, DISSOLVE 1 TABLET UNDER TONGUE AS NEEDED FOR CHEST PAIN,MAY REPEAT IN5 MINUTES FOR 2 DOSES. (Patient not taking: Reported on 03/20/2018), Disp: 25 tablet, Rfl: 6 .  potassium chloride 20 MEQ TBCR, Take 20 mEq by mouth daily., Disp: 30 tablet, Rfl: 3 .  temazepam (RESTORIL) 30 MG capsule, TAKE 1 CAPSULE AT BEDTIME AS NEEDED FOR SLEEP. (Patient not taking: Reported on 03/20/2018), Disp: 30 capsule, Rfl: 0 .  traMADol (ULTRAM) 50 MG tablet, Take 1 tablet (50 mg total) by mouth every 6 (six) hours as needed. (Patient not taking: Reported on 03/20/2018), Disp: 12 tablet, Rfl: 0 Allergies  Allergen Reactions  . Metoprolol Shortness Of Breath, Palpitations and Other (See Comments)    Heart starts racing. Shallow breathing   . Metformin And Related Nausea And Vomiting      Social History   Socioeconomic History  . Marital status: Married    Spouse name: Not on file  . Number of children: Not on file  . Years of education: Not on file  . Highest education level: Not on file  Occupational History  . 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 Financial resource strain: Not on file  . Food insecurity:    Worry: Not on file    Inability: Not on file  . Transportation needs:    Medical: Not on file    Non-medical: Not on file  Tobacco Use  . Smoking status: Former Smoker    Packs/day: 1.50    Years: 35.00    Pack years: 52.50    Types: Cigarettes  . Smokeless tobacco: Never Used  . Tobacco comment: quit atleast 25 yrs ago, per pt  Substance and Sexual Activity  . Alcohol use: Yes    Alcohol/week: 1.8 oz    Types: 3 Shots of liquor per week    Comment: socially  . Drug use: No  . Sexual activity: Yes  Lifestyle  . Physical activity:    Days per week: Not on file    Minutes per session: Not on file  . Stress: Not on file   Relationships  . Social connections:    Talks on phone: Not on file    Gets together: Not on file    Attends religious service: Not on file    Active member of club or organization: Not on file    Attends meetings of clubs or organizations: Not on file    Relationship status: Not on file  . Intimate partner violence:    Fear of current or ex partner: Not on file    Emotionally abused: Not on file    Physically abused: Not on file    Forced sexual activity: Not on file  Other Topics Concern  . Not on file  Social History Narrative   Pt lives in [place] with spouse.   Retired from [employer].  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  Physical Exam      Future Appointments  Date Time Provider [place]  04/03/2018  3:20 PM Larey Dresser, MD [facility] None  04/10/2018  3:00 PM Gardiner Barefoot, DPM [facility]  04/11/2018  9:00 AM [facility] LAB [facility] PEC  04/11/2018 11:00 AM [place] 1 [facility] None  04/19/2018 10:00 AM [facility] LAB [facility] None  05/02/2018 10:00 AM [facility]FT RM [facility] None  05/02/2018 11:00 AM Juanito Doom, MD [facility] None  07/06/2018  2:00 PM [facility] VASC 4 - SS [facility]  07/06/2018  2:45 PM Nickel, Sharmon Leyden, NP [facility]    BP 126/70   Pulse 72   Resp 15   Wt 196 lb (88.9 kg)   SpO2 95%   BMI 30.70 kg/m   Weight yesterday-195 Last visit weight-203 @ clinic  [facility]-144 Pt reports he is feeling ok, no missed doses this week in his pill box. meds verified and pill box refilled.  Pt denies any sob, he has been elevating his legs more. He needs to get another battery or glucometer-I checked his device with mine and 2 of his read 44 and 26 and mine was 144. Pt needs new glucometer-if he cant get new device then I suggested he could try a new battery. Will come back out Monday to fill pill box he was short one tab for next Tuesday of amio and wife  needs to p/u from pharmacy.   Marylouise Stacks, [name] Paramedic  03/20/18

## 2018-03-20 NOTE — Telephone Encounter (Signed)
Copied from [place] [id number]. Topic: Quick Communication - Rx Refill/Question >> Mar 20, 2018  4:49 PM Andrew [name] wrote: Pt called and states that he is needing a new test strips and blood sugar machine; pt is not aware of the name that he is needing PEC did ask if he is needing ACCU CHECK but pt stated WHATEVER WORKS; call pt if needed to get the right brand

## 2018-03-20 NOTE — Telephone Encounter (Signed)
Spoke with [facility] and they are having compliance issues with pt. She relates his 02 sats are running 91-94 and his BP is up and down, this week 220/94.Told [facility] he did not need to monitor his Blood sugars, not on any diabetic medication (allergy).

## 2018-03-21 NOTE — Telephone Encounter (Signed)
See note

## 2018-03-21 NOTE — Telephone Encounter (Signed)
Patient is requesting a new glucose machine and supplies- he has  [facility] Medicare and needs whatever they cover sent in.

## 2018-03-21 NOTE — Telephone Encounter (Signed)
Per Dr. Jerline Pain, patient does not need to check his blood sugar.  Patient is not on medication for diabetes.

## 2018-03-26 ENCOUNTER — Other Ambulatory Visit (HOSPITAL_COMMUNITY): Payer: Self-pay

## 2018-03-26 NOTE — Progress Notes (Signed)
Paramedicine Encounter    Patient ID: Andrew [name], male    DOB: Apr 27, 1928, 84 y.o.   MRN: [id number]   Patient Care Team: [name], MD as PCP - General (Family Medicine) Larey Dresser, MD as Consulting Physician (Cardiology)  Patient Active Problem List   Diagnosis Date Noted  . Bilateral recurrent inguinal hernias 11/28/2017  . Calf pain 05/25/2016  . Carotid artery stenosis 01/12/2016  . Chronic systolic CHF (congestive heart failure) ([place]) 12/02/2015  . Cerebellar infarct ([place]) 10/16/2015  . 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 ([place]) 10/16/2015  . PVD (peripheral vascular disease) ([place])   . Hereditary and idiopathic peripheral neuropathy 10/02/2015  . Peripelvic (lymphatic) cyst   . Pernicious anemia 07/15/2015  . Bilateral renal cysts 07/12/2015  . Lung nodule, 37mm RML CT 07/12/15 07/12/2015  . Constipation 07/12/2015  . Kidney stone on right side 07/12/2015  . Stage 3 chronic kidney disease ([place]) 06/14/2015  . Chronic diastolic CHF (congestive heart failure) ([place]) 05/28/2015  . Paroxysmal atrial fibrillation ([place]) 05/28/2015  . AAA (abdominal aortic aneurysm) without rupture ([place]) 09/30/2014  . Left-sided low back pain without sciatica 07/21/2014  . Cardiomyopathy, ischemic 05/23/2014  . DM (diabetes mellitus), type 2 with renal complications ([place]) 47/82/9562  . CORONARY ATHEROSCLEROSIS NATIVE CORONARY ARTERY 11/09/2010  . Depression, major, single episode, complete remission ([place]) 03/18/2010  . Hypothyroidism 02/03/2009  . Hyperlipidemia associated with type 2 diabetes mellitus ([place]) 06/14/2007  . Hypertension associated with diabetes ([place]) 06/14/2007  . GERD 06/14/2007    Current Outpatient Medications:  .  acetaminophen (TYLENOL) 500 MG tablet, Take 1,000 mg by mouth every 8 (eight) hours as needed for moderate pain. , Disp: , Rfl:  .  ALPRAZolam (XANAX) 0.5 MG tablet, TAKE 1/2 TABLET TWICE DAILY AS NEEDED FOR ANXIETY. (Patient taking  differently: Take 0.25 mg by mouth twice daily as needed for anxiety), Disp: 60 tablet, Rfl: 5 .  amiodarone (PACERONE) 100 MG tablet, Take 1 tablet (100 mg total) by mouth daily., Disp: 30 tablet, Rfl: 3 .  atorvastatin (LIPITOR) 80 MG tablet, Take 1 tablet (80 mg total) by mouth daily., Disp: 30 tablet, Rfl: 3 .  carvedilol (COREG) 12.5 MG tablet, TAKE 1 TABLET BY MOUTH TWICE DAILY., Disp: 60 tablet, Rfl: 3 .  cilostazol (PLETAL) 100 MG tablet, TAKE 1 TABLET TWICE DAILY., Disp: 180 tablet, Rfl: 3 .  ELIQUIS 2.5 MG TABS tablet, TAKE 1 TABLET BY MOUTH TWICE DAILY., Disp: 60 tablet, Rfl: 11 .  ezetimibe (ZETIA) 10 MG tablet, TAKE (1) TABLET DAILY., Disp: 90 tablet, Rfl: 0 .  FLUoxetine (PROZAC) 20 MG capsule, TAKE (1) CAPSULE DAILY., Disp: 90 capsule, Rfl: 0 .  furosemide (LASIX) 80 MG tablet, Take 120mg  (1 AND 1/2 tablets)  in the AM and 80mg  (1 tablet)  in the PM, Disp: 75 tablet, Rfl: 6 .  hydrALAZINE (APRESOLINE) 25 MG tablet, Take 1 tablet (25 mg total) by mouth 3 (three) times daily. D/c order for Losartan., Disp: 90 tablet, Rfl: 3 .  Investigational - Study Medication, Take 1 tablet by mouth daily. Study name: [place]  Additional study details: MK-1242 (vericiguat) or Placebo, Disp: 1 each, Rfl: PRN .  levothyroxine (SYNTHROID, LEVOTHROID) 100 MCG tablet, Take 1 tablet (100 mcg total) by mouth daily., Disp: 30 tablet, Rfl: 3 .  Multiple Vitamin (MULTIVITAMIN) tablet, Take 1 tablet by mouth daily., Disp: , Rfl:  .  tiotropium (SPIRIVA HANDIHALER) 18 MCG inhalation capsule, Place 1 capsule (18 mcg total) into  inhaler and inhale daily., Disp: 30 capsule, Rfl: 12 .  glucose blood (ACCU-CHEK AVIVA PLUS) test strip, CHECK BLOOD SUGAR 4 TIMES A DAY. (Patient not taking: Reported on 03/20/2018), Disp: 100 each, Rfl: 11 .  HYDROcodone-acetaminophen (NORCO/VICODIN) 5-325 MG tablet, Take 1 tablet by mouth every 4 (four) hours as needed for moderate pain. (Patient not taking: Reported on 03/20/2018), Disp:  24 tablet, Rfl: 0 .  Lancets MISC, Check blood sugar 4 times a day (Patient not taking: Reported on 03/20/2018), Disp: 100 each, Rfl: 11 .  NITROSTAT 0.4 MG SL tablet, DISSOLVE 1 TABLET UNDER TONGUE AS NEEDED FOR CHEST PAIN,MAY REPEAT IN5 MINUTES FOR 2 DOSES. (Patient not taking: Reported on 03/20/2018), Disp: 25 tablet, Rfl: 6 .  potassium chloride 20 MEQ TBCR, Take 20 mEq by mouth daily., Disp: 30 tablet, Rfl: 3 .  temazepam (RESTORIL) 30 MG capsule, TAKE 1 CAPSULE AT BEDTIME AS NEEDED FOR SLEEP. (Patient not taking: Reported on 03/20/2018), Disp: 30 capsule, Rfl: 0 .  traMADol (ULTRAM) 50 MG tablet, Take 1 tablet (50 mg total) by mouth every 6 (six) hours as needed. (Patient not taking: Reported on 03/20/2018), Disp: 12 tablet, Rfl: 0 Allergies  Allergen Reactions  . Metoprolol Shortness Of Breath, Palpitations and Other (See Comments)    Heart starts racing. Shallow breathing   . Metformin And Related Nausea And Vomiting      Social History   Socioeconomic History  . Marital status: Married    Spouse name: Not on file  . Number of children: Not on file  . Years of education: Not on file  . Highest education level: Not on file  Occupational History  . 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 Financial resource strain: Not on file  . Food insecurity:    Worry: Not on file    Inability: Not on file  . Transportation needs:    Medical: Not on file    Non-medical: Not on file  Tobacco Use  . Smoking status: Former Smoker    Packs/day: 1.50    Years: 35.00    Pack years: 52.50    Types: Cigarettes  . Smokeless tobacco: Never Used  . Tobacco comment: quit atleast 25 yrs ago, per pt  Substance and Sexual Activity  . Alcohol use: Yes    Alcohol/week: 1.8 oz    Types: 3 Shots of liquor per week    Comment: socially  . Drug use: No  . Sexual activity: Yes  Lifestyle  . Physical activity:    Days per week: Not on file    Minutes per session: Not on file  . Stress: Not on file   Relationships  . Social connections:    Talks on phone: Not on file    Gets together: Not on file    Attends religious service: Not on file    Active member of club or organization: Not on file    Attends meetings of clubs or organizations: Not on file    Relationship status: Not on file  . Intimate partner violence:    Fear of current or ex partner: Not on file    Emotionally abused: Not on file    Physically abused: Not on file    Forced sexual activity: Not on file  Other Topics Concern  . Not on file  Social History Narrative   Pt lives in [place] with spouse.   Retired from [employer].  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  Physical Exam      Future Appointments  Date Time Provider [place]  04/03/2018  3:20 PM Larey Dresser, MD [facility] None  04/10/2018  3:00 PM Gardiner Barefoot, DPM [facility]  04/11/2018  9:00 AM [facility] LAB [facility] PEC  04/11/2018 11:00 AM [place] 1 [facility] None  04/19/2018 10:00 AM [facility] LAB [facility] None  05/02/2018 10:00 AM [facility] None  05/02/2018 11:00 AM Juanito Doom, MD [facility] None  07/06/2018  2:00 PM [facility] VASC 4 - SS [facility]  07/06/2018  2:45 PM Nickel, Sharmon Leyden, NP [facility]    BP (!) 122/58   Pulse 70   Resp 15   Wt 192 lb (87.1 kg)   SpO2 94%   BMI 30.07 kg/m   Weight yesterday-192 Last visit weight-196  Pt reports he feels fine. He was cutting up a pineapple when we arrived.  Pt missed 2 doses of his meds this week. meds verified and pill box refilled.according to his PCP notes he does not need a glucometer as he isnt taking anything for diabetes. Weight is down 4lbs from last visit. Reviewed with him again about his fluid intake, sometimes he goes more than 2L-asked him to be cautious and cut back his fluids. He reports low sodium diet.   Marylouise Stacks, [facility] Paramedic  03/26/18

## 2018-04-02 ENCOUNTER — Other Ambulatory Visit (HOSPITAL_COMMUNITY): Payer: Self-pay

## 2018-04-02 NOTE — Progress Notes (Signed)
Paramedicine Encounter    Patient ID: ADITHYA DIFRANCESCO, male    DOB: 03/09/1928, 84 y.o.   MRN: [id number]   Patient Care Team: [name], MD as PCP - General (Family Medicine) Larey Dresser, MD as Consulting Physician (Cardiology)  Patient Active Problem List   Diagnosis Date Noted  . Bilateral recurrent inguinal hernias 11/28/2017  . Calf pain 05/25/2016  . Carotid artery stenosis 01/12/2016  . Chronic systolic CHF (congestive heart failure) ([place]) 12/02/2015  . Cerebellar infarct ([place]) 10/16/2015  . 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 ([place]) 10/16/2015  . PVD (peripheral vascular disease) ([place])   . Hereditary and idiopathic peripheral neuropathy 10/02/2015  . Peripelvic (lymphatic) cyst   . Pernicious anemia 07/15/2015  . Bilateral renal cysts 07/12/2015  . Lung nodule, 47mm RML CT 07/12/15 07/12/2015  . Constipation 07/12/2015  . Kidney stone on right side 07/12/2015  . Stage 3 chronic kidney disease ([place]) 06/14/2015  . Chronic diastolic CHF (congestive heart failure) ([place]) 05/28/2015  . Paroxysmal atrial fibrillation ([place]) 05/28/2015  . AAA (abdominal aortic aneurysm) without rupture ([place]) 09/30/2014  . Left-sided low back pain without sciatica 07/21/2014  . Cardiomyopathy, ischemic 05/23/2014  . DM (diabetes mellitus), type 2 with renal complications ([place]) 84/66/5993  . CORONARY ATHEROSCLEROSIS NATIVE CORONARY ARTERY 11/09/2010  . Depression, major, single episode, complete remission ([place]) 03/18/2010  . Hypothyroidism 02/03/2009  . Hyperlipidemia associated with type 2 diabetes mellitus ([place]) 06/14/2007  . Hypertension associated with diabetes ([place]) 06/14/2007  . GERD 06/14/2007    Current Outpatient Medications:  .  acetaminophen (TYLENOL) 500 MG tablet, Take 1,000 mg by mouth every 8 (eight) hours as needed for moderate pain. , Disp: , Rfl:  .  ALPRAZolam (XANAX) 0.5 MG tablet, TAKE 1/2 TABLET TWICE DAILY AS NEEDED FOR ANXIETY. (Patient taking  differently: Take 0.25 mg by mouth twice daily as needed for anxiety), Disp: 60 tablet, Rfl: 5 .  amiodarone (PACERONE) 100 MG tablet, Take 1 tablet (100 mg total) by mouth daily., Disp: 30 tablet, Rfl: 3 .  atorvastatin (LIPITOR) 80 MG tablet, Take 1 tablet (80 mg total) by mouth daily., Disp: 30 tablet, Rfl: 3 .  carvedilol (COREG) 12.5 MG tablet, TAKE 1 TABLET BY MOUTH TWICE DAILY., Disp: 60 tablet, Rfl: 3 .  cilostazol (PLETAL) 100 MG tablet, TAKE 1 TABLET TWICE DAILY., Disp: 180 tablet, Rfl: 3 .  ELIQUIS 2.5 MG TABS tablet, TAKE 1 TABLET BY MOUTH TWICE DAILY., Disp: 60 tablet, Rfl: 11 .  ezetimibe (ZETIA) 10 MG tablet, TAKE (1) TABLET DAILY., Disp: 90 tablet, Rfl: 0 .  FLUoxetine (PROZAC) 20 MG capsule, TAKE (1) CAPSULE DAILY., Disp: 90 capsule, Rfl: 0 .  furosemide (LASIX) 80 MG tablet, Take 120mg  (1 AND 1/2 tablets)  in the AM and 80mg  (1 tablet)  in the PM, Disp: 75 tablet, Rfl: 6 .  hydrALAZINE (APRESOLINE) 25 MG tablet, Take 1 tablet (25 mg total) by mouth 3 (three) times daily. D/c order for Losartan., Disp: 90 tablet, Rfl: 3 .  Investigational - Study Medication, Take 1 tablet by mouth daily. Study name: [place]  Additional study details: MK-1242 (vericiguat) or Placebo, Disp: 1 each, Rfl: PRN .  levothyroxine (SYNTHROID, LEVOTHROID) 100 MCG tablet, Take 1 tablet (100 mcg total) by mouth daily., Disp: 30 tablet, Rfl: 3 .  Multiple Vitamin (MULTIVITAMIN) tablet, Take 1 tablet by mouth daily., Disp: , Rfl:  .  tiotropium (SPIRIVA HANDIHALER) 18 MCG inhalation capsule, Place 1 capsule (18 mcg total) into  inhaler and inhale daily., Disp: 30 capsule, Rfl: 12 .  glucose blood (ACCU-CHEK AVIVA PLUS) test strip, CHECK BLOOD SUGAR 4 TIMES A DAY. (Patient not taking: Reported on 03/20/2018), Disp: 100 each, Rfl: 11 .  HYDROcodone-acetaminophen (NORCO/VICODIN) 5-325 MG tablet, Take 1 tablet by mouth every 4 (four) hours as needed for moderate pain. (Patient not taking: Reported on 03/20/2018), Disp:  24 tablet, Rfl: 0 .  Lancets MISC, Check blood sugar 4 times a day (Patient not taking: Reported on 03/20/2018), Disp: 100 each, Rfl: 11 .  NITROSTAT 0.4 MG SL tablet, DISSOLVE 1 TABLET UNDER TONGUE AS NEEDED FOR CHEST PAIN,MAY REPEAT IN5 MINUTES FOR 2 DOSES. (Patient not taking: Reported on 03/20/2018), Disp: 25 tablet, Rfl: 6 .  potassium chloride 20 MEQ TBCR, Take 20 mEq by mouth daily., Disp: 30 tablet, Rfl: 3 .  temazepam (RESTORIL) 30 MG capsule, TAKE 1 CAPSULE AT BEDTIME AS NEEDED FOR SLEEP. (Patient not taking: Reported on 03/20/2018), Disp: 30 capsule, Rfl: 0 .  traMADol (ULTRAM) 50 MG tablet, Take 1 tablet (50 mg total) by mouth every 6 (six) hours as needed. (Patient not taking: Reported on 03/20/2018), Disp: 12 tablet, Rfl: 0 Allergies  Allergen Reactions  . Metoprolol Shortness Of Breath, Palpitations and Other (See Comments)    Heart starts racing. Shallow breathing   . Metformin And Related Nausea And Vomiting      Social History   Socioeconomic History  . Marital status: Married    Spouse name: Not on file  . Number of children: Not on file  . Years of education: Not on file  . Highest education level: Not on file  Occupational History  . 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 Financial resource strain: Not on file  . Food insecurity:    Worry: Not on file    Inability: Not on file  . Transportation needs:    Medical: Not on file    Non-medical: Not on file  Tobacco Use  . Smoking status: Former Smoker    Packs/day: 1.50    Years: 35.00    Pack years: 52.50    Types: Cigarettes  . Smokeless tobacco: Never Used  . Tobacco comment: quit atleast 25 yrs ago, per pt  Substance and Sexual Activity  . Alcohol use: Yes    Alcohol/week: 1.8 oz    Types: 3 Shots of liquor per week    Comment: socially  . Drug use: No  . Sexual activity: Yes  Lifestyle  . Physical activity:    Days per week: Not on file    Minutes per session: Not on file  . Stress: Not on file   Relationships  . Social connections:    Talks on phone: Not on file    Gets together: Not on file    Attends religious service: Not on file    Active member of club or organization: Not on file    Attends meetings of clubs or organizations: Not on file    Relationship status: Not on file  . Intimate partner violence:    Fear of current or ex partner: Not on file    Emotionally abused: Not on file    Physically abused: Not on file    Forced sexual activity: Not on file  Other Topics Concern  . Not on file  Social History Narrative   Pt lives in [place] with spouse.   Retired from [employer].  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  Physical Exam      Future Appointments  Date Time Provider [place]  04/03/2018  3:20 PM Larey Dresser, MD [facility] None  04/10/2018  3:00 PM Gardiner Barefoot, DPM [facility]  04/11/2018  9:00 AM [facility] LAB [facility] PEC  04/11/2018 11:00 AM [place] 1 [facility] None  04/19/2018 10:00 AM [facility] LAB [facility] None  05/02/2018 10:00 AM [facility] RM [facility] None  05/02/2018 11:00 AM Juanito Doom, MD [facility] None  07/06/2018  2:00 PM [facility] HS VASC 4 - SS [facility] VVS  07/06/2018  2:45 PM Nickel, Sharmon Leyden, NP [facility]    BP 126/60   Pulse 68   Resp 15   Wt 197 lb (89.4 kg)   SpO2 92%   BMI 30.85 kg/m   Weight yesterday-?  Last visit weight-192  Pt reports he is feeling ok, he had a fall yesterday, he was leaning over and then just fell all the way over and he is c/o sore shoulders. Did not strike his head, no loc he reports. It took him about an hour and half to get up by himself. His wife was gone.  He took his afternoon dose of meds when I arrived, he missed only one pm dose of meds yesterday for the whole week so that has improved.  meds verified and pill box refilled. Ordered 5 meds for him.  Swelling has improved.  Lungs clear. He  no longer has the telemonitoring scales. He used his old scales today for weighing, its been a few days since he has weighed. His weight is up from last week but that is using a different scale. He goes to clinic tomor. If there are any med changes I will have to meet up with him Wednesday.   Marylouise Stacks, [facility] Paramedic  04/02/18

## 2018-04-03 ENCOUNTER — Ambulatory Visit (HOSPITAL_COMMUNITY)
Admission: RE | Admit: 2018-04-03 | Discharge: 2018-04-03 | Disposition: A | Discharge status: Home / Self Care | Payer: Medicare Other | Source: Ambulatory Visit | Attending: Cardiology | Admitting: Cardiology

## 2018-04-03 ENCOUNTER — Other Ambulatory Visit: Payer: Self-pay

## 2018-04-03 ENCOUNTER — Encounter (HOSPITAL_COMMUNITY): Payer: Self-pay | Admitting: Cardiology

## 2018-04-03 VITALS — BP 132/70 | HR 81 | Wt 197.8 lb

## 2018-04-03 DIAGNOSIS — Z9981 Dependence on supplemental oxygen: Secondary | ICD-10-CM | POA: Diagnosis not present

## 2018-04-03 DIAGNOSIS — N183 Chronic kidney disease, stage 3 unspecified: Secondary | ICD-10-CM

## 2018-04-03 DIAGNOSIS — I480 Paroxysmal atrial fibrillation: Secondary | ICD-10-CM

## 2018-04-03 DIAGNOSIS — R911 Solitary pulmonary nodule: Secondary | ICD-10-CM | POA: Diagnosis not present

## 2018-04-03 DIAGNOSIS — I714 Abdominal aortic aneurysm, without rupture: Secondary | ICD-10-CM | POA: Insufficient documentation

## 2018-04-03 DIAGNOSIS — Z79890 Hormone replacement therapy (postmenopausal): Secondary | ICD-10-CM | POA: Diagnosis not present

## 2018-04-03 DIAGNOSIS — Z85820 Personal history of malignant melanoma of skin: Secondary | ICD-10-CM | POA: Diagnosis not present

## 2018-04-03 DIAGNOSIS — J439 Emphysema, unspecified: Secondary | ICD-10-CM | POA: Insufficient documentation

## 2018-04-03 DIAGNOSIS — E1169 Type 2 diabetes mellitus with other specified complication: Secondary | ICD-10-CM | POA: Diagnosis not present

## 2018-04-03 DIAGNOSIS — I2510 Atherosclerotic heart disease of native coronary artery without angina pectoris: Secondary | ICD-10-CM | POA: Insufficient documentation

## 2018-04-03 DIAGNOSIS — I255 Ischemic cardiomyopathy: Secondary | ICD-10-CM | POA: Diagnosis not present

## 2018-04-03 DIAGNOSIS — Z8673 Personal history of transient ischemic attack (TIA), and cerebral infarction without residual deficits: Secondary | ICD-10-CM | POA: Diagnosis not present

## 2018-04-03 DIAGNOSIS — I4892 Unspecified atrial flutter: Secondary | ICD-10-CM | POA: Insufficient documentation

## 2018-04-03 DIAGNOSIS — E1122 Type 2 diabetes mellitus with diabetic chronic kidney disease: Secondary | ICD-10-CM | POA: Insufficient documentation

## 2018-04-03 DIAGNOSIS — E039 Hypothyroidism, unspecified: Secondary | ICD-10-CM | POA: Diagnosis not present

## 2018-04-03 DIAGNOSIS — K589 Irritable bowel syndrome without diarrhea: Secondary | ICD-10-CM | POA: Diagnosis not present

## 2018-04-03 DIAGNOSIS — Z7901 Long term (current) use of anticoagulants: Secondary | ICD-10-CM | POA: Insufficient documentation

## 2018-04-03 DIAGNOSIS — I252 Old myocardial infarction: Secondary | ICD-10-CM | POA: Insufficient documentation

## 2018-04-03 DIAGNOSIS — I6523 Occlusion and stenosis of bilateral carotid arteries: Secondary | ICD-10-CM | POA: Insufficient documentation

## 2018-04-03 DIAGNOSIS — Z79899 Other long term (current) drug therapy: Secondary | ICD-10-CM | POA: Insufficient documentation

## 2018-04-03 DIAGNOSIS — I130 Hypertensive heart and chronic kidney disease with heart failure and stage 1 through stage 4 chronic kidney disease, or unspecified chronic kidney disease: Secondary | ICD-10-CM | POA: Insufficient documentation

## 2018-04-03 DIAGNOSIS — I5032 Chronic diastolic (congestive) heart failure: Secondary | ICD-10-CM | POA: Diagnosis not present

## 2018-04-03 DIAGNOSIS — I779 Disorder of arteries and arterioles, unspecified: Secondary | ICD-10-CM | POA: Diagnosis not present

## 2018-04-03 DIAGNOSIS — K219 Gastro-esophageal reflux disease without esophagitis: Secondary | ICD-10-CM | POA: Insufficient documentation

## 2018-04-03 DIAGNOSIS — Z9111 Patient's noncompliance with dietary regimen: Secondary | ICD-10-CM | POA: Insufficient documentation

## 2018-04-03 DIAGNOSIS — E785 Hyperlipidemia, unspecified: Secondary | ICD-10-CM | POA: Insufficient documentation

## 2018-04-03 DIAGNOSIS — Z951 Presence of aortocoronary bypass graft: Secondary | ICD-10-CM | POA: Diagnosis not present

## 2018-04-03 DIAGNOSIS — G609 Hereditary and idiopathic neuropathy, unspecified: Secondary | ICD-10-CM | POA: Diagnosis not present

## 2018-04-03 DIAGNOSIS — I5022 Chronic systolic (congestive) heart failure: Secondary | ICD-10-CM | POA: Diagnosis not present

## 2018-04-03 DIAGNOSIS — E1151 Type 2 diabetes mellitus with diabetic peripheral angiopathy without gangrene: Secondary | ICD-10-CM | POA: Insufficient documentation

## 2018-04-03 DIAGNOSIS — Z87891 Personal history of nicotine dependence: Secondary | ICD-10-CM | POA: Insufficient documentation

## 2018-04-03 LAB — COMPREHENSIVE METABOLIC PANEL
ALT: 32 U/L (ref 17–63)
AST: 33 U/L (ref 15–41)
Albumin: 3.5 g/dL (ref 3.5–5.0)
Alkaline Phosphatase: 59 U/L (ref 38–126)
Anion gap: 10 (ref 5–15)
BILIRUBIN TOTAL: 0.5 mg/dL (ref 0.3–1.2)
BUN: 35 mg/dL — ABNORMAL HIGH (ref 6–20)
CO2: 27 mmol/L (ref 22–32)
Calcium: 9.1 mg/dL (ref 8.9–10.3)
Chloride: 101 mmol/L (ref 101–111)
Creatinine, Ser: 1.74 mg/dL — ABNORMAL HIGH (ref 0.61–1.24)
GFR, EST AFRICAN AMERICAN: 39 mL/min — AB (ref >60)
GFR, EST NON AFRICAN AMERICAN: 34 mL/min — AB (ref >60)
Glucose, Bld: 187 mg/dL — ABNORMAL HIGH (ref 65–99)
POTASSIUM: 3.6 mmol/L (ref 3.5–5.1)
Sodium: 138 mmol/L (ref 135–145)
TOTAL PROTEIN: 6.4 g/dL — AB (ref 6.5–8.1)

## 2018-04-03 MED ORDER — FUROSEMIDE 80 MG PO TABS: 120.0000 mg | ORAL_TABLET | Freq: Two times a day (BID) | ORAL | 6 refills | Status: DC

## 2018-04-03 NOTE — Progress Notes (Signed)
Patient ID: Andrew [name], male   DOB: 07/27/1928, 84 y.o.   MRN: [id number] PCP: Dr. Jerline Pain Cardiology: Dr. Christella Noa is a 84 y.o. male with history of CKD, CAD s/p CABG, and ischemic cardiomyopathy with primarily diastolic CHF presents for followup of CHF and CAD.  He has a history of PAD followed at [facility].  I took him for [facility] in 11/15. 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  He had had occlusion of a relatively small LAD, there were right to left collaterals and no intervention was done (medical management).    He and his wife went on a cruise along the [employer] in 5/16.  He admits to considerable dietary indiscretion (high sodium diet).  It appears that he developed a hypertensive crisis along with chest pain while on the ship. He went into atrial fibrillation and developed CHF.  He was taken to the hospital in [place], [place].  He was in the ICU for about a week on Bipap intermittently.  Troponin peaked at 0.09 during this admission.  He was diuresed and after about 2 wks left the hospital and was able to fly home.    Cardiolite (6/16) showed EF 42%, prior anterolateral MI, no ischemia. Echo in 10/16 with EF 40-45%.    He was admitted on 07/06/15 for RLQ pain and fever.  He was treated for UTI and had a kidney stone as well.  He developed SOB after IVF were given for AKI.  He was treated for acute COPD exacerbation as well as CHF decompensation.  He was diuresed with IV lasix.  His weight on admission was 179 and got as high as 188 after IVF. Discharge weight was 181.  He was admitted in 10/16 with a cerebellar CVA.  He has some resultant imbalance.   He was again admitted in 11/16 with acute on chronic systolic CHF.  This was a short admission that appeared to be precipitated by a sodium load from eating at a [employer] .   3/18 Echo showed stable EF 40-45%.  He was also having chest pain so I did a Cardiolite in 3/18, no ischemia.   He was admitted in 11/18  for elective hernia repair. Post-op, he developed hypoxemia and had a rehab stay.  He is now home and off oxygen.   Creatinine increased to 2.97 in 1/19.  I cut back on losartan and Lasix.  He developed increased lower extremity edema and we increased Lasix back to 120 qam/80 qpm.   He presents today for regular HF follow up. Last visit he was volume overload and was instructed to take extra lasix x2 days, then back to 120/80. Overall, feeling great. SOB is better. SOB with moving quickly. No SOB with stairs or ADLs. Activity limited by leg pain. Sleeps in a recliner at baseline for back pain. No PND. Mild edema in RLE. No CP or dizziness. His BP has been running 120s at home. Weights: ~195 lbs at home. Compliant with meds. Trying to limit salt. Usually drinks about 2L. Ate at [facility] last night.   Labs (12/14): K 4.8, creatinine 1.5 Labs (5/15): LDL 92 Labs (11/15): K 4.1, creatinine 1.5 Labs (12/15): K 4, creatinine 1.5 Labs (1/16): LFTs normal, LDL 51, HDL 47 Labs (5/16): TnI 0.09 Labs (6/16): K 4.8 => 4.4, creatinine 1.79 => 2.26, HCT 28.7 Labs (7/16) K 4.2, creatinine 1.98, LFTs normal, TSH normal Labs (8/16): HCT 27.1 Labs (9/16): hgb 8.1,  K 4.3, creatinine 1.8, LFTs normal, TSH elevated Labs (10/16): LDL 74, HDL 45, TSH 5.19 (increased), free T3 and free T4 normal.  Labs (11/16): K 3.5, creatinine 1.47, AST 84, ALT 89 Labs (12/16): K 4.3, creatinine 1.58, AST 43, ALT 42 Labs (3/17): K 4.2, creatinine 1.4, hgb 10.7, AST 36, ALT 45 Labs (6/17): K 4, creatinine 1.69, hgb 10.9, proBNP 389, TSH mildly elevated Labs (8/17): Free T4 and T3 normal, TSH mildly elevated, K 4.2, creatinine 1.7, LFTs normal, hgb 10.4 Labs (12/17): TSH elevated but free T3 and T4 normal, K 4.5, creatinine 1.9, LDL 45, HDL 39, LFTs normal, HCT 32.9 Labs (1/18): K 4.4, creatinine 1.93 Labs (3/18): K 4.3, creatinine 1.87, BNP 187, hgb 11.1, LDL 75, LFTs normal Labs (4/18): K 4.1, creatinine 1.7 Labs (9/18): K 4,  creatinine 1.8, hgb 11.9 Labs (10/18): free T4 0.4, free T3 1.9, TSH 139  Labs (12/18): K 3.4, creatinine 1.76, hgb 12.2 Labs (1/19): K 4 => 4.9, creatinine 2.7 => 2.97, TSH 179, LFTs normal Labs (2/19): K 4.6, creatinine 2.19  PMH: 1. CKD stage 3 2. HTN 3. Hyperlipidemia 4. AAA: s/p surgery with left renal artery bypass in 1997 - Abdominal US 10/18 with 3.2 AAA 5. GERD 6. IBS 7. Melanoma s/p reception 8. Diabetes: Diet-controlled 9. Carotid stenosis: Carotid dopplers (4/15) with 40-59% bilateral stenosis. Carotid dopplers (7/16) with < 40% BICA stenosis.  Carotid dopplers (10/16) with 40-59% BICA stenosis.  - Carotids (8/17) with minimal disease.  - Carotids (10/18) with 1-39% BICA stenosis.  - Carotids (1/19): 1-39% BICA stenosis 10. PAD: 9/14 ABIs 0.91 right 1.09 left.  7/16 ABIs 0.86 right, 1.1 left; aortoiliac duplex with < 50% bilateral iliac stenosis.  - Aortoiliac duplex (8/17) with < 50% bilateral iliac stenosis.   - ABIs (10/17): left 0.91, right 0.98, left TBI 0.44 (abnormal).  - ABIs (7/18): right 0.97, right 0.75 - ABIs (1/19): right 1.04, left 1.03 11. CAD: Has super-dominant RCA. s/p CABG 1992.  He had Taxus DES to mid PDA in 5/02. LHC (1/05) with 90% ostial PDA (had DES to this vessel), totally occluded RIMA-PDA, totally occluded PLV, sequential SVG-PLV with 1 branch occluded.  LHC (11/15) with left main off the right cusp, 40-50% ostial left main, total occlusion of the proximal LAD (small vessel) with right to left collaterals, large super-dominant RCA with 50% ISR mid RCA, 50% ostial PDA, patent SVG-PLV, RIMA-PDA known atretic. Lexiscan Cardiolite (6/16) with EF 42%, prior anterolateral MI, no ischemia.  - Lexiscan Cardiolite (3/18): EF 51%, basal to mid anterolateral fixed defect with no ischemia.  12. Ischemic cardiomyopathy: Echo (12/09) with EF 45-50%, basal to mid inferolateral hypokinesis. Echo (6/15) with EF 45-50%, akinesis of the mid to apical inferolateral  wall.  Echo (6/16) with EF 50-55%, mild LVH, inferior hypokinesis, mild MR.  Echo (10/16) with EF 40-45%, moderate LVH, mildly decreased RV systolic function.  - Echo (3/18) with EF 40-45%, mid anterior hypokinesis.  - Echo (2/19) with EF 60-65%, mild LVH, trade II diastolic dysfunction.  13. OA 14. Atrial fibrillation/flutter: Paroxysmal.  Noted during 5/16 hospitalization in [place] and in 6/16.  He developed atrial flutter in 8/16, back in NSR by 9/16.  15. Emphysema: PFTs (8/16) with FVC 88%, FEV1 74%, ratio 81%, TLC 83%, DLCO 31%. - PFTs (12/18): FVC 64%, FEV1 69%, ratio 105%, TLC 82%, DLCO 33% => mild obstruction, severely decreased DLCO (similar to prior).   16. Renal cysts 17. Idiopathic peripheral neuropathy.  18. Anemia.  19.  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  20. Hernia repair (11/18) 21. Hypothyroidism 22. Bronchiectasis: CT chest 1/19 with bronchiectasis.  23. Lung nodule: 1.3 cm RML nodule on 1/19 CT, was noted in 2014 => slow growing. Possibly malignant.  Decision made with pulmonary to watch and avoid biopsy.  24. 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  FH: Brother with CAD.   Review of systems complete and found to be negative unless listed in HPI.    Current Outpatient Medications  Medication Sig Dispense Refill  . acetaminophen (TYLENOL) 500 MG tablet Take 1,000 mg by mouth every 8 (eight) hours as needed for moderate pain.     Marland Kitchen ALPRAZolam (XANAX) 0.5 MG tablet TAKE 1/2 TABLET TWICE DAILY AS NEEDED FOR ANXIETY. (Patient taking differently: Take 0.25 mg by mouth twice daily as needed for anxiety) 60 tablet 5  . amiodarone (PACERONE) 100 MG tablet Take 1 tablet (100 mg total) by mouth daily. 30 tablet 3  . atorvastatin (LIPITOR) 80 MG tablet Take 1 tablet (80 mg total) by mouth daily. 30 tablet 3  . carvedilol (COREG) 12.5 MG tablet TAKE 1 TABLET BY MOUTH TWICE DAILY. 60 tablet 3  .  cilostazol (PLETAL) 100 MG tablet TAKE 1 TABLET TWICE DAILY. 180 tablet 3  . ELIQUIS 2.5 MG TABS tablet TAKE 1 TABLET BY MOUTH TWICE DAILY. 60 tablet 11  . ezetimibe (ZETIA) 10 MG tablet TAKE (1) TABLET DAILY. 90 tablet 0  . FLUoxetine (PROZAC) 20 MG capsule TAKE (1) CAPSULE DAILY. 90 capsule 0  . furosemide (LASIX) 80 MG tablet Take 120mg  (1 AND 1/2 tablets)  in the AM and 80mg  (1 tablet)  in the PM 75 tablet 6  . glucose blood (ACCU-CHEK AVIVA PLUS) test strip CHECK BLOOD SUGAR 4 TIMES A DAY. 100 each 11  . hydrALAZINE (APRESOLINE) 25 MG tablet Take 1 tablet (25 mg total) by mouth 3 (three) times daily. D/c order for Losartan. 90 tablet 3  . HYDROcodone-acetaminophen (NORCO/VICODIN) 5-325 MG tablet Take 1 tablet by mouth every 4 (four) hours as needed for moderate pain. 24 tablet 0  . Investigational - Study Medication Take 1 tablet by mouth daily. Study name: [place]  Additional study details: MK-1242 (vericiguat) or Placebo 1 each PRN  . Lancets MISC Check blood sugar 4 times a day 100 each 11  . levothyroxine (SYNTHROID, LEVOTHROID) 100 MCG tablet Take 1 tablet (100 mcg total) by mouth daily. 30 tablet 3  . Multiple Vitamin (MULTIVITAMIN) tablet Take 1 tablet by mouth daily.    Marland Kitchen NITROSTAT 0.4 MG SL tablet DISSOLVE 1 TABLET UNDER TONGUE AS NEEDED FOR CHEST PAIN,MAY REPEAT IN5 MINUTES FOR 2 DOSES. 25 tablet 6  . potassium chloride 20 MEQ TBCR Take 20 mEq by mouth daily. 30 tablet 3  . temazepam (RESTORIL) 30 MG capsule TAKE 1 CAPSULE AT BEDTIME AS NEEDED FOR SLEEP. 30 capsule 0  . tiotropium (SPIRIVA HANDIHALER) 18 MCG inhalation capsule Place 1 capsule (18 mcg total) into inhaler and inhale daily. 30 capsule 12  . traMADol (ULTRAM) 50 MG tablet Take 1 tablet (50 mg total) by mouth every 6 (six) hours as needed. 12 tablet 0   No current facility-administered medications for this encounter.    Vitals:   04/03/18 1539  BP: 132/70  Pulse: 81  SpO2: 95%  Weight: 197 lb 12 oz (89.7 kg)    Wt Readings from Last 3 Encounters:  04/03/18 197 lb 12 oz (  89.7 kg)  04/02/18 197 lb (89.4 kg)  03/26/18 192 lb (87.1 kg)    Physical Exam General: Elderly appearing. No resp difficulty. HEENT: Normal Neck: Supple. JVP ~10. Carotids 2+ bilat; no bruits. No thyromegaly or nodule noted. Cor: PMI nondisplaced. RRR, No M/G/R noted Lungs: fine crackles in bases.  Abdomen: Soft, non-tender, non-distended, no HSM. No bruits or masses. +BS  Extremities: No cyanosis, clubbing, or rash. R and LLE 2+ edema. BLE with chronic venous stasis changes  Neuro: Alert & orientedx3, cranial nerves grossly intact. moves all 4 extremities w/o difficulty. Affect pleasant  Assessment/Plan:  1. CAD: s/p CABG.  Cath in 11/15 showed occluded LAD with left to right collaterals.  The LAD was a relatively small vessel (super-dominant right).  Managed medically. Lexiscan Cardiolite in 6/16 with infarction but no ischemia.  Cardiolite 3/18 with infarction, no ischemia.  - No s/s ischemia - Continue ASA and statin.  2. Ischemic cardiomyopathy with systolic CHF: EF 83-41% on 3/18 echo.  Echo 02/19/18 LVEF 60-65%, Grade 2 DD. NYHA II-III chronically.  - Weight down 6 lbs since last visit, but volume status remains elevated on exam in setting of fluid and dietary non-compliance.   - Increase lasix to 120 mg BID. CMET today.  - Losartan stopped with AKI.  - Continue hydralazine 25 mg TID.  - No imdur on [place] study drug (vericiguat versus placebo).  - Continue Coreg 12.5 mg bid.    - Encouraged daily compression hose.  - Reinforced fluid restriction to < 2 L daily, sodium restriction to less than 2000 mg daily, and the importance of daily weights.   3. Carotid stenosis:  - Stable. Followed at [facility].  4. PAD: Stable claudication, ABIs with some improvement in 1/19.  No pedal ulcers.   - Follows at [facility]. Plan for medical management.  - Continue cilostazol, unable to come off due to worsening claudication when he stops  it.  5. Hyperlipidemia:  - LDL elevated 159 on 01/2018. Restarted atorvastatin. Will recheck lipids in 2 weeks.  6. CKD III:   - CMET today. Creatinine 1.54 03/14/18 - Losartan stopped.  7. Atrial fibrillation/flutter:  Paroxysmal - Regular on exam.  - Continue amiodarone 100 mg daily. Will need to get regular eye exams with amiodarone use.  Normal LFTs in 1/19.  High resolution CT in 1/19 did not appear to show evidence for amiodarone lung toxicity. CMET today.  - Continue Eliquis 2.5 bid.   8. Suspected OSA: - Cancelled sleep study due to mask discomfort.  9. COPD: He is now back off oxygen.  - Based on PFT abnormality with mild obstruction but severely decreased DLCO.  1/19 high resolution CT chest showed bronchiectasis.  - Follows with Dr. Lake Bells.  10. CVA: Cerebellar, 10/16.  He has had some resulting imbalance.  - He is no longer driving.  11. Hypertension:  - Pressure stable on hydralazine 25 mg TID. SBP 120s with Paramedicine checks 12. Hypothyroidism: Likely related to amiodarone. -  levothyroxine and recently increased it.  TSH was still very high last checked.  - I will defer to PCP for further management.  13. Pulmonary nodule: 1.3 cm RML nodule on 1/19 CT chest.  It was also seen in 2014 => slow growing, think repeat CT 1 year is most appropriate.   - Dr Lake Bells has discussed with wife and they have decided not to go forward with biopsy. They would not be interested in lung cancer treatment.   CMET today BMET, lipid  panel in 2 weeks Increase lasix to 120 mg BID   Andrew Shore, NP  04/03/2018   Patient seen with NP, agree with the above note.  Dr. 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  He is mildly volume overloaded, likely due to dietary noncompliance. Last echo showed EF 29-02%, diastolic CHF.  - I will have him increase Lasix to 120 mg bid.  BVMET today and again in 2 wks.   Continue amiodarone, will need to check LFTs.  PCP follows  thyroid (hypothyroid).  Check lipids in 2 wks (2 months back on statin).   I discussed the lung nodule finding with Dr. Lake Bells, who talked to the patient and wife.  The nodule appears to be grown very slowly, they decided against biopsy or treatment.   Loralie Champagne 04/04/2018

## 2018-04-03 NOTE — Patient Instructions (Signed)
Increase Furosemide 120 mg (1.5 tab), twice a day  Labs drawn today (if we do not call you, then your lab work was stable)   Your physician recommends that you return for lab work in: 04/19/2018 (see next page)  Your physician recommends that you schedule a follow-up appointment in: 2 months with Dr. [name]

## 2018-04-04 ENCOUNTER — Other Ambulatory Visit (HOSPITAL_COMMUNITY): Payer: Self-pay

## 2018-04-04 NOTE — Progress Notes (Signed)
me out today for med rec from clinic visit yesterday- his lasix was increased to 120mg  BID. That change was made in pill box.  Will come back out on Monday.   B/P-128/70 P-70 R-16 SP02-94%  Marylouise Stacks, EMT-Paramedic 04/04/18

## 2018-04-05 ENCOUNTER — Telehealth: Payer: Self-pay | Admitting: Family Medicine

## 2018-04-05 NOTE — Telephone Encounter (Signed)
Copied from [place] [id number]. Topic: Quick Communication - See Telephone Encounter >> Apr 05, 2018  4:38 PM Clack, Laban Emperor wrote: CRM for notification. See Telephone encounter for: 04/05/18.  Margaretha Sheffield calling [facility] to let Dr. Jerline Pain know that skilled nursing is completed and pt is discharge.  Contact # [phone]

## 2018-04-06 NOTE — Telephone Encounter (Signed)
Noted  

## 2018-04-09 ENCOUNTER — Other Ambulatory Visit (HOSPITAL_COMMUNITY): Payer: Self-pay

## 2018-04-09 NOTE — Progress Notes (Signed)
Paramedicine Encounter    Patient ID: Andrew [name], male    DOB: January 28, 1928, 84 y.o.   MRN: [id number]   Patient Care Team: [name], MD as PCP - General (Family Medicine) Larey Dresser, MD as Consulting Physician (Cardiology)  Patient Active Problem List   Diagnosis Date Noted  . Bilateral recurrent inguinal hernias 11/28/2017  . Calf pain 05/25/2016  . Carotid artery stenosis 01/12/2016  . Chronic systolic CHF (congestive heart failure) ([place]) 12/02/2015  . Cerebellar infarct ([place]) 10/16/2015  . 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 ([place]) 10/16/2015  . PVD (peripheral vascular disease) ([place])   . Hereditary and idiopathic peripheral neuropathy 10/02/2015  . Peripelvic (lymphatic) cyst   . Pernicious anemia 07/15/2015  . Bilateral renal cysts 07/12/2015  . Lung nodule, 14mm RML CT 07/12/15 07/12/2015  . Constipation 07/12/2015  . Kidney stone on right side 07/12/2015  . Stage 3 chronic kidney disease ([place]) 06/14/2015  . Chronic diastolic CHF (congestive heart failure) ([place]) 05/28/2015  . Paroxysmal atrial fibrillation ([place]) 05/28/2015  . AAA (abdominal aortic aneurysm) without rupture ([place]) 09/30/2014  . Left-sided low back pain without sciatica 07/21/2014  . Cardiomyopathy, ischemic 05/23/2014  . DM (diabetes mellitus), type 2 with renal complications ([place]) 56/21/3086  . CORONARY ATHEROSCLEROSIS NATIVE CORONARY ARTERY 11/09/2010  . Depression, major, single episode, complete remission ([place]) 03/18/2010  . Hypothyroidism 02/03/2009  . Hyperlipidemia associated with type 2 diabetes mellitus ([place]) 06/14/2007  . Hypertension associated with diabetes ([place]) 06/14/2007  . GERD 06/14/2007    Current Outpatient Medications:  .  acetaminophen (TYLENOL) 500 MG tablet, Take 1,000 mg by mouth every 8 (eight) hours as needed for moderate pain. , Disp: , Rfl:  .  amiodarone (PACERONE) 100 MG tablet, Take 1 tablet (100 mg total) by mouth daily., Disp: 30 tablet, Rfl:  3 .  atorvastatin (LIPITOR) 80 MG tablet, Take 1 tablet (80 mg total) by mouth daily., Disp: 30 tablet, Rfl: 3 .  carvedilol (COREG) 12.5 MG tablet, TAKE 1 TABLET BY MOUTH TWICE DAILY., Disp: 60 tablet, Rfl: 3 .  cilostazol (PLETAL) 100 MG tablet, TAKE 1 TABLET TWICE DAILY., Disp: 180 tablet, Rfl: 3 .  ELIQUIS 2.5 MG TABS tablet, TAKE 1 TABLET BY MOUTH TWICE DAILY., Disp: 60 tablet, Rfl: 11 .  ezetimibe (ZETIA) 10 MG tablet, TAKE (1) TABLET DAILY., Disp: 90 tablet, Rfl: 0 .  FLUoxetine (PROZAC) 20 MG capsule, TAKE (1) CAPSULE DAILY., Disp: 90 capsule, Rfl: 0 .  furosemide (LASIX) 80 MG tablet, Take 1.5 tablets (120 mg total) by mouth 2 (two) times daily., Disp: 75 tablet, Rfl: 6 .  hydrALAZINE (APRESOLINE) 25 MG tablet, Take 1 tablet (25 mg total) by mouth 3 (three) times daily. D/c order for Losartan., Disp: 90 tablet, Rfl: 3 .  Investigational - Study Medication, Take 1 tablet by mouth daily. Study name: [place]  Additional study details: MK-1242 (vericiguat) or Placebo, Disp: 1 each, Rfl: PRN .  levothyroxine (SYNTHROID, LEVOTHROID) 100 MCG tablet, Take 1 tablet (100 mcg total) by mouth daily., Disp: 30 tablet, Rfl: 3 .  Multiple Vitamin (MULTIVITAMIN) tablet, Take 1 tablet by mouth daily., Disp: , Rfl:  .  potassium chloride 20 MEQ TBCR, Take 20 mEq by mouth daily., Disp: 30 tablet, Rfl: 3 .  tiotropium (SPIRIVA HANDIHALER) 18 MCG inhalation capsule, Place 1 capsule (18 mcg total) into inhaler and inhale daily., Disp: 30 capsule, Rfl: 12 .  ALPRAZolam (XANAX) 0.5 MG tablet, TAKE 1/2 TABLET TWICE DAILY AS NEEDED  FOR ANXIETY. (Patient not taking: Reported on 04/04/2018), Disp: 60 tablet, Rfl: 5 .  glucose blood (ACCU-CHEK AVIVA PLUS) test strip, CHECK BLOOD SUGAR 4 TIMES A DAY. (Patient not taking: Reported on 04/04/2018), Disp: 100 each, Rfl: 11 .  HYDROcodone-acetaminophen (NORCO/VICODIN) 5-325 MG tablet, Take 1 tablet by mouth every 4 (four) hours as needed for moderate pain. (Patient not taking:  Reported on 04/09/2018), Disp: 24 tablet, Rfl: 0 .  Lancets MISC, Check blood sugar 4 times a day (Patient not taking: Reported on 04/04/2018), Disp: 100 each, Rfl: 11 .  NITROSTAT 0.4 MG SL tablet, DISSOLVE 1 TABLET UNDER TONGUE AS NEEDED FOR CHEST PAIN,MAY REPEAT IN5 MINUTES FOR 2 DOSES. (Patient not taking: Reported on 04/04/2018), Disp: 25 tablet, Rfl: 6 .  temazepam (RESTORIL) 30 MG capsule, TAKE 1 CAPSULE AT BEDTIME AS NEEDED FOR SLEEP. (Patient not taking: Reported on 04/04/2018), Disp: 30 capsule, Rfl: 0 .  traMADol (ULTRAM) 50 MG tablet, Take 1 tablet (50 mg total) by mouth every 6 (six) hours as needed. (Patient not taking: Reported on 04/04/2018), Disp: 12 tablet, Rfl: 0 Allergies  Allergen Reactions  . Metoprolol Shortness Of Breath, Palpitations and Other (See Comments)    Heart starts racing. Shallow breathing   . Metformin And Related Nausea And Vomiting      Social History   Socioeconomic History  . Marital status: Married    Spouse name: Not on file  . Number of children: Not on file  . Years of education: Not on file  . Highest education level: Not on file  Occupational History  . 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 Financial resource strain: Not on file  . Food insecurity:    Worry: Not on file    Inability: Not on file  . Transportation needs:    Medical: Not on file    Non-medical: Not on file  Tobacco Use  . Smoking status: Former Smoker    Packs/day: 1.50    Years: 35.00    Pack years: 52.50    Types: Cigarettes  . Smokeless tobacco: Never Used  . Tobacco comment: quit atleast 25 yrs ago, per pt  Substance and Sexual Activity  . Alcohol use: Yes    Alcohol/week: 1.8 oz    Types: 3 Shots of liquor per week    Comment: socially  . Drug use: No  . Sexual activity: Yes  Lifestyle  . Physical activity:    Days per week: Not on file    Minutes per session: Not on file  . Stress: Not on file  Relationships  . Social connections:    Talks on phone: Not  on file    Gets together: Not on file    Attends religious service: Not on file    Active member of club or organization: Not on file    Attends meetings of clubs or organizations: Not on file    Relationship status: Not on file  . Intimate partner violence:    Fear of current or ex partner: Not on file    Emotionally abused: Not on file    Physically abused: Not on file    Forced sexual activity: Not on file  Other Topics Concern  . Not on file  Social History Narrative   Pt lives in [place] with spouse.   Retired from [employer].  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    Physical Exam  Constitutional: He is oriented to person, place, and  time. He appears well-developed.  HENT:  Head: Normocephalic.  Cardiovascular: Normal rate.  Pulmonary/Chest: Breath sounds normal.  Abdominal: Soft.  Neurological: He is oriented to person, place, and time.        Future Appointments  Date Time Provider [place]  04/10/2018  3:00 PM Gardiner Barefoot, DPM [facility]  04/11/2018  9:00 AM [facility] LAB [facility] PEC  04/11/2018 11:00 AM [place] 1 [facility] None  04/19/2018 10:00 AM [facility] LAB [facility] None  05/02/2018 10:00 AM [facility] None  05/02/2018 11:00 AM Juanito Doom, MD [facility] None  06/11/2018  2:20 PM Larey Dresser, MD [facility] None  07/06/2018  2:00 PM [facility] VASC 4 - SS [facility]  07/06/2018  2:45 PM Nickel, Sharmon Leyden, NP [facility]    BP (!) 108/58   Pulse 65   Resp 16   Wt 194 lb (88 kg)   SpO2 93%   BMI 30.38 kg/m   Weight yesterday-? Last visit weight-197  Pt reports that he is feeling just fine, he missed numerous doses of his meds last week.  meds verified and pill box refilled. Advised him the importance of taking his all his doses. Pt denies any increased sob, no falls noted.  He states he is doing his best to stay below 2L but he isnt so  much. His legs appear to be smaller, no swelling noted. He ate vietnamese last night for supper and had general tso's chicken.  Also reminded him those foods are high in sodium. He states that he will be more careful.   Marylouise Stacks, [place] [facility] Paramedic  04/09/18

## 2018-04-10 ENCOUNTER — Encounter: Payer: Self-pay | Admitting: Podiatry

## 2018-04-10 ENCOUNTER — Ambulatory Visit: Payer: Medicare Other | Admitting: Podiatry

## 2018-04-10 DIAGNOSIS — B351 Tinea unguium: Secondary | ICD-10-CM

## 2018-04-10 DIAGNOSIS — M79676 Pain in unspecified toe(s): Secondary | ICD-10-CM | POA: Diagnosis not present

## 2018-04-10 DIAGNOSIS — E1159 Type 2 diabetes mellitus with other circulatory complications: Secondary | ICD-10-CM | POA: Diagnosis not present

## 2018-04-10 DIAGNOSIS — I739 Peripheral vascular disease, unspecified: Secondary | ICD-10-CM

## 2018-04-10 NOTE — Progress Notes (Signed)
Patient ID: Andrew [name], male   DOB: 09/16/1928, 84 y.o.   MRN: [id number] Complaint:  Visit Type: Patient returns to my office for continued preventative foot care services. Complaint: Patient states" my nails have grown long and thick and become painful to walk and wear shoes" Patient has been diagnosed with DM with no foot complications. The patient presents for preventative foot care services. No changes to ROS.  Patient is on eliquiss and pletal.  Podiatric Exam: Vascular: dorsalis pedis and posterior tibial pulses are palpable bilateral. Capillary return is immediate. Temperature gradient is WNL. Skin turgor WNL  Sensorium: Normal Semmes Weinstein monofilament test. Normal tactile sensation bilaterally. Nail Exam: Pt has thick disfigured discolored nails with subungual debris noted bilateral entire nail hallux through fifth toenails Ulcer Exam: There is no evidence of ulcer or pre-ulcerative changes or infection. Orthopedic Exam: Muscle tone and strength are WNL. No limitations in general ROM. No crepitus or effusions noted. Foot type and digits show no abnormalities. Bony prominences are unremarkable. 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 Skin: No Porokeratosis. No infection or ulcers  Diagnosis:  Onychomycosis, , Pain in right toe, pain in left toes  Treatment & Plan Procedures and Treatment: Consent by patient was obtained for treatment procedures. The patient understood the discussion of treatment and procedures well. All questions were answered thoroughly reviewed. Debridement of mycotic and hypertrophic toenails, 1 through 5 bilateral and clearing of subungual debris. No ulceration, no infection noted.  Return Visit-Office Procedure: Patient instructed to return to the office for a follow up visit 10 weeks. for continued evaluation and treatment.   [name] 

## 2018-04-11 ENCOUNTER — Other Ambulatory Visit (INDEPENDENT_AMBULATORY_CARE_PROVIDER_SITE_OTHER): Payer: Medicare Other

## 2018-04-11 ENCOUNTER — Encounter: Payer: Medicare Other | Admitting: *Deleted

## 2018-04-11 VITALS — BP 109/73 | HR 72 | Wt 194.2 lb

## 2018-04-11 DIAGNOSIS — Z006 Encounter for examination for normal comparison and control in clinical research program: Secondary | ICD-10-CM

## 2018-04-11 DIAGNOSIS — E039 Hypothyroidism, unspecified: Secondary | ICD-10-CM

## 2018-04-11 LAB — TSH: TSH: 36.93 u[IU]/mL — ABNORMAL HIGH (ref 0.35–4.50)

## 2018-04-12 NOTE — Progress Notes (Signed)
RESEARCH ENCOUNTER  Patient ID: Andrew [name]  DOB: 25-Aug-1928  Andrew [name] presented to the [facility] for the Visit 11/Month 28 visit of the [employer].  No syncope, symptomatic hypotension, or ACS since the last visit. Subject did have a mechanical fall when he was leaning over on April 01, 2018; no LOC.  Subject states compliance with IP.  Unfortunately, subject forgot to return medication and bottles.  Spoke with subject and his wife about returning study IP and bottles to the [employer] [facility]  Reinforced the need to start the new bottles and place the old bottles to the side in order to accurately calculate compliance.  Subject and wife verbalized understanding.  Called Marylouise Stacks with Paramedicine and discussed the study medication and the new IP dispensed today.    [facility].

## 2018-04-16 ENCOUNTER — Telehealth: Payer: Self-pay | Admitting: Family Medicine

## 2018-04-16 ENCOUNTER — Other Ambulatory Visit (HOSPITAL_COMMUNITY): Payer: Self-pay

## 2018-04-16 NOTE — Telephone Encounter (Signed)
Copied from [place] [id number]. Topic: Quick Communication - Lab Results >> Apr 12, 2018  3:20 PM Mervyn Skeeters, CMA wrote: Called patient to inform them of  lab results. When patient returns call, triage nurse may disclose results.  cb is [phone]

## 2018-04-16 NOTE — Progress Notes (Signed)
Paramedicine Encounter    Patient ID: Andrew [name], male    DOB: [birth year], 84 y.o.   MRN: [id number]   Patient Care Team: [name], MD as PCP - General (Family Medicine) Larey Dresser, MD as Consulting Physician (Cardiology)  Patient Active Problem List   Diagnosis Date Noted  . Bilateral recurrent inguinal hernias 11/28/2017  . Calf pain 05/25/2016  . Carotid artery stenosis 01/12/2016  . Chronic systolic CHF (congestive heart failure) ([place]) 12/02/2015  . Cerebellar infarct ([place]) 10/16/2015  . 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 ([place]) 10/16/2015  . PVD (peripheral vascular disease) ([place])   . Hereditary and idiopathic peripheral neuropathy 10/02/2015  . Peripelvic (lymphatic) cyst   . Pernicious anemia 07/15/2015  . Bilateral renal cysts 07/12/2015  . Lung nodule, 15mm RML CT 07/12/15 07/12/2015  . Constipation 07/12/2015  . Kidney stone on right side 07/12/2015  . Stage 3 chronic kidney disease ([place]) 06/14/2015  . Chronic diastolic CHF (congestive heart failure) ([place]) 05/28/2015  . Paroxysmal atrial fibrillation ([place]) 05/28/2015  . AAA (abdominal aortic aneurysm) without rupture ([place]) 09/30/2014  . Left-sided low back pain without sciatica 07/21/2014  . Cardiomyopathy, ischemic 05/23/2014  . DM (diabetes mellitus), type 2 with renal complications ([place]) 56/43/3295  . CORONARY ATHEROSCLEROSIS NATIVE CORONARY ARTERY 11/09/2010  . Depression, major, single episode, complete remission ([place]) 03/18/2010  . Hypothyroidism 02/03/2009  . Hyperlipidemia associated with type 2 diabetes mellitus ([place]) 06/14/2007  . Hypertension associated with diabetes ([place]) 06/14/2007  . GERD 06/14/2007    Current Outpatient Medications:  .  acetaminophen (TYLENOL) 500 MG tablet, Take 1,000 mg by mouth every 8 (eight) hours as needed for moderate pain. , Disp: , Rfl:  .  ALPRAZolam (XANAX) 0.5 MG tablet, TAKE 1/2 TABLET TWICE DAILY AS NEEDED FOR ANXIETY., Disp: 60 tablet,  Rfl: 5 .  amiodarone (PACERONE) 100 MG tablet, Take 1 tablet (100 mg total) by mouth daily., Disp: 30 tablet, Rfl: 3 .  atorvastatin (LIPITOR) 80 MG tablet, Take 1 tablet (80 mg total) by mouth daily., Disp: 30 tablet, Rfl: 3 .  carvedilol (COREG) 12.5 MG tablet, TAKE 1 TABLET BY MOUTH TWICE DAILY., Disp: 60 tablet, Rfl: 3 .  cilostazol (PLETAL) 100 MG tablet, TAKE 1 TABLET TWICE DAILY., Disp: 180 tablet, Rfl: 3 .  ELIQUIS 2.5 MG TABS tablet, TAKE 1 TABLET BY MOUTH TWICE DAILY., Disp: 60 tablet, Rfl: 11 .  ezetimibe (ZETIA) 10 MG tablet, TAKE (1) TABLET DAILY., Disp: 90 tablet, Rfl: 0 .  FLUoxetine (PROZAC) 20 MG capsule, TAKE (1) CAPSULE DAILY., Disp: 90 capsule, Rfl: 0 .  furosemide (LASIX) 80 MG tablet, Take 1.5 tablets (120 mg total) by mouth 2 (two) times daily., Disp: 75 tablet, Rfl: 6 .  hydrALAZINE (APRESOLINE) 25 MG tablet, Take 1 tablet (25 mg total) by mouth 3 (three) times daily. D/c order for Losartan., Disp: 90 tablet, Rfl: 3 .  HYDROcodone-acetaminophen (NORCO/VICODIN) 5-325 MG tablet, Take 1 tablet by mouth every 4 (four) hours as needed for moderate pain., Disp: 24 tablet, Rfl: 0 .  Investigational - Study Medication, Take 1 tablet by mouth daily. Study name: [place]  Additional study details: MK-1242 (vericiguat) or Placebo, Disp: 1 each, Rfl: PRN .  levothyroxine (SYNTHROID, LEVOTHROID) 100 MCG tablet, Take 1 tablet (100 mcg total) by mouth daily., Disp: 30 tablet, Rfl: 3 .  Multiple Vitamin (MULTIVITAMIN) tablet, Take 1 tablet by mouth daily., Disp: , Rfl:  .  potassium chloride 20 MEQ TBCR, Take 20 mEq  by mouth daily., Disp: 30 tablet, Rfl: 3 .  tiotropium (SPIRIVA HANDIHALER) 18 MCG inhalation capsule, Place 1 capsule (18 mcg total) into inhaler and inhale daily., Disp: 30 capsule, Rfl: 12 .  glucose blood (ACCU-CHEK AVIVA PLUS) test strip, CHECK BLOOD SUGAR 4 TIMES A DAY. (Patient not taking: Reported on 04/16/2018), Disp: 100 each, Rfl: 11 .  Lancets MISC, Check blood sugar 4  times a day (Patient not taking: Reported on 04/16/2018), Disp: 100 each, Rfl: 11 .  NITROSTAT 0.4 MG SL tablet, DISSOLVE 1 TABLET UNDER TONGUE AS NEEDED FOR CHEST PAIN,MAY REPEAT IN5 MINUTES FOR 2 DOSES. (Patient not taking: Reported on 04/16/2018), Disp: 25 tablet, Rfl: 6 .  temazepam (RESTORIL) 30 MG capsule, TAKE 1 CAPSULE AT BEDTIME AS NEEDED FOR SLEEP. (Patient not taking: Reported on 04/16/2018), Disp: 30 capsule, Rfl: 0 .  traMADol (ULTRAM) 50 MG tablet, Take 1 tablet (50 mg total) by mouth every 6 (six) hours as needed. (Patient not taking: Reported on 04/16/2018), Disp: 12 tablet, Rfl: 0 Allergies  Allergen Reactions  . Metoprolol Shortness Of Breath, Palpitations and Other (See Comments)    Heart starts racing. Shallow breathing   . Metformin And Related Nausea And Vomiting      Social History   Socioeconomic History  . Marital status: Married    Spouse name: Not on file  . Number of children: Not on file  . Years of education: Not on file  . Highest education level: Not on file  Occupational History  . 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 Financial resource strain: Not on file  . Food insecurity:    Worry: Not on file    Inability: Not on file  . Transportation needs:    Medical: Not on file    Non-medical: Not on file  Tobacco Use  . Smoking status: Former Smoker    Packs/day: 1.50    Years: 35.00    Pack years: 52.50    Types: Cigarettes  . Smokeless tobacco: Never Used  . Tobacco comment: quit atleast 25 yrs ago, per pt  Substance and Sexual Activity  . Alcohol use: Yes    Alcohol/week: 1.8 oz    Types: 3 Shots of liquor per week    Comment: socially  . Drug use: No  . Sexual activity: Yes  Lifestyle  . Physical activity:    Days per week: Not on file    Minutes per session: Not on file  . Stress: Not on file  Relationships  . Social connections:    Talks on phone: Not on file    Gets together: Not on file    Attends religious service: Not on file     Active member of club or organization: Not on file    Attends meetings of clubs or organizations: Not on file    Relationship status: Not on file  . Intimate partner violence:    Fear of current or ex partner: Not on file    Emotionally abused: Not on file    Physically abused: Not on file    Forced sexual activity: Not on file  Other Topics Concern  . Not on file  Social History Narrative   Pt lives in [place] with spouse.   Retired from [employer].  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    Physical Exam      Future Appointments  Date Time Provider [place]  04/19/2018 10:00 AM [facility] LAB [facility] None  05/02/2018 10:00 AM LBPU-PFT RM L[facility]U-PULCARE None  05/02/2018 11:00 AM Juanito Doom, MD LBPU-PULCARE None  06/11/2018  2:20 PM Larey Dresser, MD MC-[facility]SC None  07/06/2018  2:00 PM MC-CV HS VASC 4 - SS MC-HCVI VVS  07/06/2018  2:45 PM Nickel, Sharmon Leyden, NP [facility] VVS  07/10/2018  3:15 PM Gardiner Barefoot, DPM [facility]  07/18/2018  1:00 PM [place] 2 LBRE-CVRES None    BP (!) 150/74   Pulse 74   Wt 194 lb (88 kg)   SpO2 93%   BMI 30.38 kg/m   Weight yesterday-194 Last visit weight-194  Pt has improved compliance with meds-he only missed 1 am dose of meds and 1 om dose of meds. Encouraged him to be sure to take all doses of meds.  We started with a new pack of the research medicine per Burman Nieves and wife will take back the other bottles at his lab visit this week.  He went to PCP last week for labs for thyroid and his dose was increased but the pharmacy has not gotten the refill yet-wife is calling in to PCP to have it resent. Filled up pill box with the 114mcg tabs for now.  He reports breathing is doing fine-"waxes and wanes" He denies any c/p, no dizziness.  Called in [place]-- wife called PCP and waiting to hear back about that increased dose of thyroid med.  meds verified and  pill box refilled.   Marylouise Stacks, [facility] Paramedic  04/16/18

## 2018-04-19 ENCOUNTER — Ambulatory Visit (HOSPITAL_COMMUNITY)
Admission: RE | Admit: 2018-04-19 | Discharge: 2018-04-19 | Disposition: A | Discharge status: Home / Self Care | Payer: Medicare Other | Source: Ambulatory Visit | Attending: Internal Medicine | Admitting: Internal Medicine

## 2018-04-19 DIAGNOSIS — E1169 Type 2 diabetes mellitus with other specified complication: Secondary | ICD-10-CM

## 2018-04-19 DIAGNOSIS — I5032 Chronic diastolic (congestive) heart failure: Secondary | ICD-10-CM | POA: Insufficient documentation

## 2018-04-19 DIAGNOSIS — E785 Hyperlipidemia, unspecified: Secondary | ICD-10-CM | POA: Diagnosis not present

## 2018-04-19 LAB — HEPATIC FUNCTION PANEL
ALBUMIN: 3.8 g/dL (ref 3.5–5.0)
ALT: 38 U/L (ref 17–63)
AST: 43 U/L — AB (ref 15–41)
Alkaline Phosphatase: 57 U/L (ref 38–126)
BILIRUBIN INDIRECT: 0.6 mg/dL (ref 0.3–0.9)
Bilirubin, Direct: 0.1 mg/dL (ref 0.1–0.5)
TOTAL PROTEIN: 6.5 g/dL (ref 6.5–8.1)
Total Bilirubin: 0.7 mg/dL (ref 0.3–1.2)

## 2018-04-19 LAB — LIPID PANEL
CHOL/HDL RATIO: 3.2 ratio
Cholesterol: 115 mg/dL (ref 0–200)
HDL: 36 mg/dL — ABNORMAL LOW (ref >40)
LDL CALC: 61 mg/dL (ref 0–99)
Triglycerides: 88 mg/dL (ref <150)
VLDL: 18 mg/dL (ref 0–40)

## 2018-04-19 LAB — BASIC METABOLIC PANEL
Anion gap: 13 (ref 5–15)
BUN: 29 mg/dL — AB (ref 6–20)
CHLORIDE: 102 mmol/L (ref 101–111)
CO2: 25 mmol/L (ref 22–32)
Calcium: 8.8 mg/dL — ABNORMAL LOW (ref 8.9–10.3)
Creatinine, Ser: 1.77 mg/dL — ABNORMAL HIGH (ref 0.61–1.24)
GFR calc Af Amer: 38 mL/min — ABNORMAL LOW (ref >60)
GFR calc non Af Amer: 33 mL/min — ABNORMAL LOW (ref >60)
Glucose, Bld: 116 mg/dL — ABNORMAL HIGH (ref 65–99)
POTASSIUM: 3.9 mmol/L (ref 3.5–5.1)
SODIUM: 140 mmol/L (ref 135–145)

## 2018-04-20 ENCOUNTER — Encounter (HOSPITAL_COMMUNITY): Payer: Self-pay

## 2018-04-23 ENCOUNTER — Telehealth: Payer: Self-pay | Admitting: Family Medicine

## 2018-04-23 ENCOUNTER — Other Ambulatory Visit (HOSPITAL_COMMUNITY): Payer: Self-pay

## 2018-04-23 NOTE — Progress Notes (Signed)
Paramedicine Encounter    Patient ID: Andrew [name], male    DOB: Nov 22, 1928, 84 y.o.   MRN: [id number]   Patient Care Team: [name], MD as PCP - General (Family Medicine) Larey Dresser, MD as Consulting Physician (Cardiology)  Patient Active Problem List   Diagnosis Date Noted  . Bilateral recurrent inguinal hernias 11/28/2017  . Calf pain 05/25/2016  . Carotid artery stenosis 01/12/2016  . Chronic systolic CHF (congestive heart failure) ([place]) 12/02/2015  . Cerebellar infarct ([place]) 10/16/2015  . 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 ([place]) 10/16/2015  . PVD (peripheral vascular disease) ([place])   . Hereditary and idiopathic peripheral neuropathy 10/02/2015  . Peripelvic (lymphatic) cyst   . Pernicious anemia 07/15/2015  . Bilateral renal cysts 07/12/2015  . Lung nodule, 72mm RML CT 07/12/15 07/12/2015  . Constipation 07/12/2015  . Kidney stone on right side 07/12/2015  . Stage 3 chronic kidney disease ([place]) 06/14/2015  . Chronic diastolic CHF (congestive heart failure) ([place]) 05/28/2015  . Paroxysmal atrial fibrillation ([place]) 05/28/2015  . AAA (abdominal aortic aneurysm) without rupture ([place]) 09/30/2014  . Left-sided low back pain without sciatica 07/21/2014  . Cardiomyopathy, ischemic 05/23/2014  . DM (diabetes mellitus), type 2 with renal complications ([place]) 46/96/2952  . CORONARY ATHEROSCLEROSIS NATIVE CORONARY ARTERY 11/09/2010  . Depression, major, single episode, complete remission ([place]) 03/18/2010  . Hypothyroidism 02/03/2009  . Hyperlipidemia associated with type 2 diabetes mellitus ([place]) 06/14/2007  . Hypertension associated with diabetes ([place]) 06/14/2007  . GERD 06/14/2007    Current Outpatient Medications:  .  acetaminophen (TYLENOL) 500 MG tablet, Take 1,000 mg by mouth every 8 (eight) hours as needed for moderate pain. , Disp: , Rfl:  .  amiodarone (PACERONE) 100 MG tablet, Take 1 tablet (100 mg total) by mouth daily., Disp: 30 tablet, Rfl:  3 .  atorvastatin (LIPITOR) 80 MG tablet, Take 1 tablet (80 mg total) by mouth daily., Disp: 30 tablet, Rfl: 3 .  carvedilol (COREG) 12.5 MG tablet, TAKE 1 TABLET BY MOUTH TWICE DAILY., Disp: 60 tablet, Rfl: 3 .  cilostazol (PLETAL) 100 MG tablet, TAKE 1 TABLET TWICE DAILY., Disp: 180 tablet, Rfl: 3 .  ELIQUIS 2.5 MG TABS tablet, TAKE 1 TABLET BY MOUTH TWICE DAILY., Disp: 60 tablet, Rfl: 11 .  ezetimibe (ZETIA) 10 MG tablet, TAKE (1) TABLET DAILY., Disp: 90 tablet, Rfl: 0 .  FLUoxetine (PROZAC) 20 MG capsule, TAKE (1) CAPSULE DAILY., Disp: 90 capsule, Rfl: 0 .  furosemide (LASIX) 80 MG tablet, Take 1.5 tablets (120 mg total) by mouth 2 (two) times daily., Disp: 75 tablet, Rfl: 6 .  hydrALAZINE (APRESOLINE) 25 MG tablet, Take 1 tablet (25 mg total) by mouth 3 (three) times daily. D/c order for Losartan., Disp: 90 tablet, Rfl: 3 .  HYDROcodone-acetaminophen (NORCO/VICODIN) 5-325 MG tablet, Take 1 tablet by mouth every 4 (four) hours as needed for moderate pain., Disp: 24 tablet, Rfl: 0 .  Investigational - Study Medication, Take 1 tablet by mouth daily. Study name: [place]  Additional study details: MK-1242 (vericiguat) or Placebo, Disp: 1 each, Rfl: PRN .  levothyroxine (SYNTHROID, LEVOTHROID) 100 MCG tablet, Take 1 tablet (100 mcg total) by mouth daily., Disp: 30 tablet, Rfl: 3 .  Multiple Vitamin (MULTIVITAMIN) tablet, Take 1 tablet by mouth daily., Disp: , Rfl:  .  potassium chloride 20 MEQ TBCR, Take 20 mEq by mouth daily., Disp: 30 tablet, Rfl: 3 .  tiotropium (SPIRIVA HANDIHALER) 18 MCG inhalation capsule, Place 1 capsule (18  mcg total) into inhaler and inhale daily., Disp: 30 capsule, Rfl: 12 .  ALPRAZolam (XANAX) 0.5 MG tablet, TAKE 1/2 TABLET TWICE DAILY AS NEEDED FOR ANXIETY. (Patient not taking: Reported on 04/23/2018), Disp: 60 tablet, Rfl: 5 .  glucose blood (ACCU-CHEK AVIVA PLUS) test strip, CHECK BLOOD SUGAR 4 TIMES A DAY. (Patient not taking: Reported on 04/16/2018), Disp: 100 each,  Rfl: 11 .  Lancets MISC, Check blood sugar 4 times a day (Patient not taking: Reported on 04/16/2018), Disp: 100 each, Rfl: 11 .  NITROSTAT 0.4 MG SL tablet, DISSOLVE 1 TABLET UNDER TONGUE AS NEEDED FOR CHEST PAIN,MAY REPEAT IN5 MINUTES FOR 2 DOSES. (Patient not taking: Reported on 04/16/2018), Disp: 25 tablet, Rfl: 6 .  temazepam (RESTORIL) 30 MG capsule, TAKE 1 CAPSULE AT BEDTIME AS NEEDED FOR SLEEP. (Patient not taking: Reported on 04/16/2018), Disp: 30 capsule, Rfl: 0 .  traMADol (ULTRAM) 50 MG tablet, Take 1 tablet (50 mg total) by mouth every 6 (six) hours as needed. (Patient not taking: Reported on 04/16/2018), Disp: 12 tablet, Rfl: 0 Allergies  Allergen Reactions  . Metoprolol Shortness Of Breath, Palpitations and Other (See Comments)    Heart starts racing. Shallow breathing   . Metformin And Related Nausea And Vomiting      Social History   Socioeconomic History  . Marital status: Married    Spouse name: Not on file  . Number of children: Not on file  . Years of education: Not on file  . Highest education level: Not on file  Occupational History  . 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 Financial resource strain: Not on file  . Food insecurity:    Worry: Not on file    Inability: Not on file  . Transportation needs:    Medical: Not on file    Non-medical: Not on file  Tobacco Use  . Smoking status: Former Smoker    Packs/day: 1.50    Years: 35.00    Pack years: 52.50    Types: Cigarettes  . Smokeless tobacco: Never Used  . Tobacco comment: quit atleast 25 yrs ago, per pt  Substance and Sexual Activity  . Alcohol use: Yes    Alcohol/week: 1.8 oz    Types: 3 Shots of liquor per week    Comment: socially  . Drug use: No  . Sexual activity: Yes  Lifestyle  . Physical activity:    Days per week: Not on file    Minutes per session: Not on file  . Stress: Not on file  Relationships  . Social connections:    Talks on phone: Not on file    Gets together: Not on file     Attends religious service: Not on file    Active member of club or organization: Not on file    Attends meetings of clubs or organizations: Not on file    Relationship status: Not on file  . Intimate partner violence:    Fear of current or ex partner: Not on file    Emotionally abused: Not on file    Physically abused: Not on file    Forced sexual activity: Not on file  Other Topics Concern  . Not on file  Social History Narrative   Pt lives in [place] with spouse.   Retired from [employer].  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    Physical Exam      Future Appointments  Date Time Provider [place]  05/02/2018  10:00 AM LBPU-PFT RM L[facility]U-PULCARE None  05/02/2018 11:00 AM Juanito Doom, MD LBPU-PULCARE None  06/11/2018  2:20 PM Larey Dresser, MD MC-[facility] None  07/06/2018  2:00 PM MC-CV HS VASC 4 - SS MC-H[facility]I VVS  07/06/2018  2:45 PM Nickel, Sharmon Leyden, NP [facility]  07/10/2018  3:15 PM Gardiner Barefoot, DPM [facility]  07/18/2018  1:00 PM [place] 2 LBRE-CVRES None    BP 122/64   Pulse 70   Resp 20   Wt 191 lb 12.8 oz (87 kg)   SpO2 95%   BMI 30.04 kg/m   Weight yesterday-191.8 Last visit weight-194  Visit with pt at home.  Looking at pt med box he has missed 2 1/2 days of meds in last week.  Amiodarone called in to be refilled.  Meds verified and med box filled. Contacted Dr Marigene Ehlers office about change in Levothroxyn dose, New dose prescription of 112 mcg, per Dr Ellwood Handler office they will fax over. Med box filled today with 166mcg dose till new prescription is filled, will change out.  Pt denies dizziness or headaches.  Pt reports breathing is doing fine-"waxes and wanes".  Advised wife to pick up prescriptions at pharmacy.  Lungs sounds clear.  Swelling in extremities more in right leg, but good compared to past visits.  Overall pt states feels good.  Marylouise Stacks,  [facility] Paramedic  04/23/18

## 2018-04-23 NOTE — Telephone Encounter (Signed)
Copied from [place]. Topic: Quick Communication - Rx Refill/Question >> Apr 23, 2018  4:05 PM Robina Ade, Helene Kelp D wrote: Medication:levothyroxine (SYNTHROID, LEVOTHROID) 100 MCG tablet new rx doze sent Has the patient contacted their pharmacy? Yes (Agent: If no, request that the patient contact the pharmacy: [facility] [place] [facility]: Please be advised that RX refills may take up to 3 business days. We ask that you follow-up with your pharmacy.

## 2018-04-24 ENCOUNTER — Other Ambulatory Visit: Payer: Self-pay

## 2018-04-24 MED ORDER — LEVOTHYROXINE SODIUM 100 MCG PO TABS: 100.0000 ug | ORAL_TABLET | Freq: Every day | ORAL | 3 refills | Status: DC

## 2018-04-24 NOTE — Telephone Encounter (Signed)
Rx called in 

## 2018-04-24 NOTE — Telephone Encounter (Signed)
Refill for levothyroxine.   Last TSH  36.93  According to lab result from 04/12/18, levothyroxine should have been increased from 100 mcg to 112.5 mg  LOV  04/10/18 NOV  No appointment noted  Provider: Dr. Zack Seal  Pharmacy  [facility]  Please review

## 2018-04-25 ENCOUNTER — Other Ambulatory Visit: Payer: Self-pay

## 2018-04-25 DIAGNOSIS — E039 Hypothyroidism, unspecified: Secondary | ICD-10-CM

## 2018-04-26 ENCOUNTER — Telehealth: Payer: Self-pay | Admitting: Family Medicine

## 2018-04-26 ENCOUNTER — Other Ambulatory Visit (HOSPITAL_COMMUNITY): Payer: Self-pay

## 2018-04-26 NOTE — Progress Notes (Signed)
Paramedicine Encounter    Patient ID: Andrew [name], male    DOB: 01/09/1928, 84 y.o.   MRN: [id number]   Patient Care Team: [name], MD as PCP - General (Family Medicine) Larey Dresser, MD as Consulting Physician (Cardiology)  Patient Active Problem List   Diagnosis Date Noted  . Bilateral recurrent inguinal hernias 11/28/2017  . Calf pain 05/25/2016  . Carotid artery stenosis 01/12/2016  . Chronic systolic CHF (congestive heart failure) ([place]) 12/02/2015  . Cerebellar infarct ([place]) 10/16/2015  . 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 ([place]) 10/16/2015  . PVD (peripheral vascular disease) ([place])   . Hereditary and idiopathic peripheral neuropathy 10/02/2015  . Peripelvic (lymphatic) cyst   . Pernicious anemia 07/15/2015  . Bilateral renal cysts 07/12/2015  . Lung nodule, 70mm RML CT 07/12/15 07/12/2015  . Constipation 07/12/2015  . Kidney stone on right side 07/12/2015  . Stage 3 chronic kidney disease ([place]) 06/14/2015  . Chronic diastolic CHF (congestive heart failure) ([place]) 05/28/2015  . Paroxysmal atrial fibrillation ([place]) 05/28/2015  . AAA (abdominal aortic aneurysm) without rupture ([place]) 09/30/2014  . Left-sided low back pain without sciatica 07/21/2014  . Cardiomyopathy, ischemic 05/23/2014  . DM (diabetes mellitus), type 2 with renal complications ([place]) 08/67/6195  . CORONARY ATHEROSCLEROSIS NATIVE CORONARY ARTERY 11/09/2010  . Depression, major, single episode, complete remission ([place]) 03/18/2010  . Hypothyroidism 02/03/2009  . Hyperlipidemia associated with type 2 diabetes mellitus ([name]) 06/14/2007  . Hypertension associated with diabetes ([place]) 06/14/2007  . GERD 06/14/2007    Current Outpatient Medications:  .  amiodarone (PACERONE) 100 MG tablet, Take 1 tablet (100 mg total) by mouth daily., Disp: 30 tablet, Rfl: 3 .  atorvastatin (LIPITOR) 80 MG tablet, Take 1 tablet (80 mg total) by mouth daily., Disp: 30 tablet, Rfl: 3 .  carvedilol (COREG)  12.5 MG tablet, TAKE 1 TABLET BY MOUTH TWICE DAILY., Disp: 60 tablet, Rfl: 3 .  cilostazol (PLETAL) 100 MG tablet, TAKE 1 TABLET TWICE DAILY., Disp: 180 tablet, Rfl: 3 .  ELIQUIS 2.5 MG TABS tablet, TAKE 1 TABLET BY MOUTH TWICE DAILY., Disp: 60 tablet, Rfl: 11 .  ezetimibe (ZETIA) 10 MG tablet, TAKE (1) TABLET DAILY., Disp: 90 tablet, Rfl: 0 .  FLUoxetine (PROZAC) 20 MG capsule, TAKE (1) CAPSULE DAILY., Disp: 90 capsule, Rfl: 0 .  furosemide (LASIX) 80 MG tablet, Take 1.5 tablets (120 mg total) by mouth 2 (two) times daily., Disp: 75 tablet, Rfl: 6 .  hydrALAZINE (APRESOLINE) 25 MG tablet, Take 1 tablet (25 mg total) by mouth 3 (three) times daily. D/c order for Losartan., Disp: 90 tablet, Rfl: 3 .  Investigational - Study Medication, Take 1 tablet by mouth daily. Study name: [place]  Additional study details: MK-1242 (vericiguat) or Placebo, Disp: 1 each, Rfl: PRN .  levothyroxine (SYNTHROID, LEVOTHROID) 100 MCG tablet, Take 1 tablet (100 mcg total) by mouth daily., Disp: 30 tablet, Rfl: 3 .  Multiple Vitamin (MULTIVITAMIN) tablet, Take 1 tablet by mouth daily., Disp: , Rfl:  .  potassium chloride 20 MEQ TBCR, Take 20 mEq by mouth daily., Disp: 30 tablet, Rfl: 3 .  tiotropium (SPIRIVA HANDIHALER) 18 MCG inhalation capsule, Place 1 capsule (18 mcg total) into inhaler and inhale daily., Disp: 30 capsule, Rfl: 12 .  acetaminophen (TYLENOL) 500 MG tablet, Take 1,000 mg by mouth every 8 (eight) hours as needed for moderate pain. , Disp: , Rfl:  .  ALPRAZolam (XANAX) 0.5 MG tablet, TAKE 1/2 TABLET TWICE DAILY AS NEEDED  FOR ANXIETY. (Patient not taking: Reported on 04/23/2018), Disp: 60 tablet, Rfl: 5 .  glucose blood (ACCU-CHEK AVIVA PLUS) test strip, CHECK BLOOD SUGAR 4 TIMES A DAY. (Patient not taking: Reported on 04/16/2018), Disp: 100 each, Rfl: 11 .  HYDROcodone-acetaminophen (NORCO/VICODIN) 5-325 MG tablet, Take 1 tablet by mouth every 4 (four) hours as needed for moderate pain. (Patient not taking:  Reported on 04/26/2018), Disp: 24 tablet, Rfl: 0 .  Lancets MISC, Check blood sugar 4 times a day (Patient not taking: Reported on 04/26/2018), Disp: 100 each, Rfl: 11 .  NITROSTAT 0.4 MG SL tablet, DISSOLVE 1 TABLET UNDER TONGUE AS NEEDED FOR CHEST PAIN,MAY REPEAT IN5 MINUTES FOR 2 DOSES. (Patient not taking: Reported on 04/16/2018), Disp: 25 tablet, Rfl: 6 .  temazepam (RESTORIL) 30 MG capsule, TAKE 1 CAPSULE AT BEDTIME AS NEEDED FOR SLEEP. (Patient not taking: Reported on 04/16/2018), Disp: 30 capsule, Rfl: 0 .  traMADol (ULTRAM) 50 MG tablet, Take 1 tablet (50 mg total) by mouth every 6 (six) hours as needed. (Patient not taking: Reported on 04/16/2018), Disp: 12 tablet, Rfl: 0 Allergies  Allergen Reactions  . Metoprolol Shortness Of Breath, Palpitations and Other (See Comments)    Heart starts racing. Shallow breathing   . Metformin And Related Nausea And Vomiting      Social History   Socioeconomic History  . Marital status: Married    Spouse name: Not on file  . Number of children: Not on file  . Years of education: Not on file  . Highest education level: Not on file  Occupational History  . 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 Financial resource strain: Not on file  . Food insecurity:    Worry: Not on file    Inability: Not on file  . Transportation needs:    Medical: Not on file    Non-medical: Not on file  Tobacco Use  . Smoking status: Former Smoker    Packs/day: 1.50    Years: 35.00    Pack years: 52.50    Types: Cigarettes  . Smokeless tobacco: Never Used  . Tobacco comment: quit atleast 25 yrs ago, per pt  Substance and Sexual Activity  . Alcohol use: Yes    Alcohol/week: 1.8 oz    Types: 3 Shots of liquor per week    Comment: socially  . Drug use: No  . Sexual activity: Yes  Lifestyle  . Physical activity:    Days per week: Not on file    Minutes per session: Not on file  . Stress: Not on file  Relationships  . Social connections:    Talks on phone: Not  on file    Gets together: Not on file    Attends religious service: Not on file    Active member of club or organization: Not on file    Attends meetings of clubs or organizations: Not on file    Relationship status: Not on file  . Intimate partner violence:    Fear of current or ex partner: Not on file    Emotionally abused: Not on file    Physically abused: Not on file    Forced sexual activity: Not on file  Other Topics Concern  . Not on file  Social History Narrative   Pt lives in [place] with spouse.   Retired from [employer].  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    Physical Exam      Future Appointments  Date  Time Provider [place]  05/02/2018 10:00 AM [facility]-PFT RM [facility]ULC[facility] None  05/02/2018 11:00 AM Juanito Doom, MD [facility]-PULCARE None  06/06/2018  9:30 AM [facility] LAB [facility] PEC  06/11/2018  2:20 PM Larey Dresser, MD [facility] None  07/06/2018  2:00 PM MC-CV HS VASC 4 - SS [facility]-HCVI VVS  07/06/2018  2:45 PM Nickel, Sharmon Leyden, NP [facility] VVS  07/10/2018  3:15 PM Gardiner Barefoot, DPM [facility]  07/18/2018  1:00 PM [place] 2 LBRE-CVRES None    BP (!) 110/50   Pulse 66   Resp 15   SpO2 92%    Came out today for med rec--he needed amio placed in pill box-wife went to pharmacy to get it--he also needs the increased dose of levothyroxine of the 161mcg dose called in to pharmacy-we are still using the 160mcg dose.  meds verified and pill box refilled. Called PCP office dr Jerline Pain and advised them he is still using the 180mcg dose-nurse is suppose to talk with dr Jerline Pain about what pt needs to be doing and I advised them if he needs to be increased to 198mcg then a rx needs to be sent to pharm.   Marylouise Stacks, [facility] Paramedic  04/26/18

## 2018-04-26 NOTE — Telephone Encounter (Signed)
Copied from [place] [id number]. Topic: Quick Communication - See Telephone Encounter >> Apr 26, 2018  3:59 PM Ether Griffins B wrote: CRM for notification. See Telephone encounter for: 04/26/18.  Katie EMT who comes out and sets up the pts pill box states his levothyroxine (SYNTHROID, LEVOTHROID) 100 MCG tablet has been increased to 112 MCG. It was called in as a 100MCG on 04/24/18. Please advise. Katie CB# [phone]

## 2018-04-27 ENCOUNTER — Other Ambulatory Visit: Payer: Self-pay

## 2018-04-27 MED ORDER — LEVOTHYROXINE SODIUM 112 MCG PO TABS: 112.0000 ug | ORAL_TABLET | Freq: Every day | ORAL | 3 refills | Status: DC

## 2018-04-27 NOTE — Telephone Encounter (Signed)
Called in levothyroxine (SYNTHROID, LEVOTHROID 112mg . LVMOM for Katie, (EMT that puts his meds together) that patient needs to come into office for a LAB only appointment for repeat of TSH

## 2018-04-27 NOTE — Telephone Encounter (Signed)
Spoke with wife on 04/23/2018 and let her know CMP wanted patient to recheck TSH and make a LAB only appointment. Wife states she was leaving and couldn't schedule patient's lab appt due to patient having several appointments and to check his calendar.  She hasn't called to schedule. LVMOM for EMT that put his meds together that patient's wife needs to schedule a LAB only appointment.

## 2018-04-27 NOTE — Progress Notes (Signed)
OK 

## 2018-05-02 ENCOUNTER — Ambulatory Visit: Payer: Medicare Other | Admitting: Pulmonary Disease

## 2018-05-02 ENCOUNTER — Encounter: Payer: Self-pay | Admitting: Pulmonary Disease

## 2018-05-02 VITALS — BP 130/62 | HR 72 | Ht 67.0 in | Wt 190.2 lb

## 2018-05-02 DIAGNOSIS — R911 Solitary pulmonary nodule: Secondary | ICD-10-CM | POA: Diagnosis not present

## 2018-05-02 DIAGNOSIS — J849 Interstitial pulmonary disease, unspecified: Secondary | ICD-10-CM | POA: Diagnosis not present

## 2018-05-02 DIAGNOSIS — I5022 Chronic systolic (congestive) heart failure: Secondary | ICD-10-CM | POA: Diagnosis not present

## 2018-05-02 NOTE — Progress Notes (Signed)
Subjective:   PATIENT ID: Andrew [name] GENDER: male DOB: 06-15-1928, MRN: [id number]  Synopsis: Referred in February 2019 by Dr. Marigene Ehlers for an abnormal PFT.  History of chronic kidney disease coronary disease status post coronary artery bypass grafting ischemic cardiomyopathy with primarily diastolic heart failure.  He also has a history of peripheral arterial disease.  CABG was performed in 1992.  He has had various stents placed since then.  2019 echocardiogram showed an LVEF of 60-65% with mild LVH.  HPI  Chief Complaint  Patient presents with  . Follow-up    ROV   Andrew [name] says that Andrew breathing is "fine".  He says that he is going to the bathroom a lot since the diuretic dose was adjusted.  He has not had cough cold or congestion.  He denies any sort of shortness of breath.  He has not had weight loss or hemoptysis.  He denies trouble swallowing.  He says he has no ankle swelling.   Past Medical History:  Diagnosis Date  . AAA (abdominal aortic aneurysm) ([place])   . Anemia   . Arthritis   . CAD (coronary artery disease)   . Cancer ([place])    Melanoma - Back  . Carotid artery stenosis   . CHF (congestive heart failure) ([place])   . Cyst of kidney, acquired   . Depression   . Diabetes mellitus   . DVT (deep venous thrombosis) ([place])   . Dysrhythmia   . Fatty liver 2008  . GERD (gastroesophageal reflux disease)   . Hyperlipidemia   . Hypertension   . IBS (irritable bowel syndrome)   . LVF (left ventricular failure) ([place])   . Paroxysmal atrial fibrillation ([facility])   . Pneumonia Jan. 2014  . Shortness of breath dyspnea   . Thyroid disease   . Typical atrial flutter ([place])   . UTI (urinary tract infection) 05/2015   While in [place]   . Vitamin D deficiency        Review of Systems  Constitutional: Negative for chills, diaphoresis and malaise/fatigue.  HENT: Negative for congestion, ear discharge and sinus pain.   Respiratory: Negative for cough, hemoptysis and sputum  production.   Cardiovascular: Negative for palpitations, claudication and leg swelling.      Objective:  Physical Exam   Vitals:   05/02/18 1108  BP: 130/62  Pulse: 72  SpO2: 94%  Weight: 190 lb 3.2 oz (86.3 kg)  Height: 5\' 7"  (1.702 m)    Gen: well appearing HENT: OP clear, TM's clear, neck supple PULM: Crackles bases B, normal percussion CV: RRR, no mgr, trace edema GI: BS+, soft, nontender Derm: no cyanosis or rash Psyche: normal mood and affect   CBC    Component Value Date/Time   WBC 9.0 01/19/2018 1629   RBC 3.46 (L) 01/19/2018 1629   HGB 11.5 (L) 01/19/2018 1629   HCT 35.3 (L) 01/19/2018 1629   PLT 187 01/19/2018 1629   MCV 102.0 (H) 01/19/2018 1629   MCH 33.2 01/19/2018 1629   MCHC 32.6 01/19/2018 1629   RDW 13.6 01/19/2018 1629   LYMPHSABS 1.6 08/27/2017 1551   MONOABS 0.6 08/27/2017 1551   EOSABS 0.2 08/27/2017 1551   BASOSABS 0.0 08/27/2017 1551     Chest imaging: January 2019 CT chest images independently reviewed showing a patulous esophagus, basal predominant nonspecific groundglass and fibrotic changes no reticular changes, some bronchiectasis in the bases, a small groundglass area in the right middle lobe of undetermined significance.  PFT: December 2018 lung function testing ratio 72%, FEV1 1.49 L 71% predicted, FVC 2.05 L 67% predicted, total lung capacity 5.2 L 82% predicted, DLCO 9.02 mL 33% predicted  Labs:  Path:  Echo: 2019 echocardiogram showed an LVEF of 60-65% with mild LVH. Normal RV  Other imaging: March 2019 barium esophagram showed mild presbyesophagus, mild smooth narrowing of the distal esophagus but the 13 mm barium tablet could pass beyond this, no laryngeal penetration or aspiration noted  Heart Catheterization:  Records from Andrew visit with the advanced heart failure clinic and on April 03, 2018 reviewed where Lasix was increased     Assessment & Plan:   ILD (interstitial lung disease) ([place])  Chronic systolic  CHF (congestive heart failure) ([place])  Pulmonary nodule  Discussion: [place] has bronchiectasis and nonspecific scarring in the bases of Andrew lungs which I think is probably due to oropharyngeal aspiration considering Andrew mild dementia.  The esophagram showed no evidence of a physical obstruction.  The differential diagnosis includes a nonspecific interstitial pneumonitis but considering Andrew advanced age and Andrew [name]'s stated goals of [name] for him as well as Andrew dementia I do not think that pursuing this further is wise.  Even if we discovered progressive nonspecific interstitial pneumonitis the treatment would be more harsh than observation alone.  I think the best approach moving forward is just to deal with the consequences of bronchiectasis which would mean that we should treat with antibiotics in the event of a cough with mucus production.  Hand hygiene was encouraged today, Andrew immunizations are up-to-date.  Plan: Pulmonary nodule: This was discussed with the patient, Andrew [name], and multiple physicians.  After some consideration we have elected to not pursue this further.  The patient's [name] states that he would not be capable of handling any sort of cancer treatment so she would prefer to not work it up further.  She understands that we are concerned this likely represents a slowly progressive malignancy.  Congestive heart failure: Keep follow-up with the heart failure clinic Continue diuretics (Lasix) as directed by the heart failure clinic  Bronchiectasis: This term means that you have scarring in your lungs which makes you susceptible to respiratory infections If you have a cough with mucus production please call sooner rather than later so that we can start an antibiotic  We will see you back in 1 year or sooner if needed   Current Outpatient Medications:  .  acetaminophen (TYLENOL) 500 MG tablet, Take 1,000 mg by mouth every 8 (eight) hours as needed for moderate pain. , Disp: , Rfl:   .  ALPRAZolam (XANAX) 0.5 MG tablet, TAKE 1/2 TABLET TWICE DAILY AS NEEDED FOR ANXIETY., Disp: 60 tablet, Rfl: 5 .  amiodarone (PACERONE) 100 MG tablet, Take 1 tablet (100 mg total) by mouth daily., Disp: 30 tablet, Rfl: 3 .  atorvastatin (LIPITOR) 80 MG tablet, Take 1 tablet (80 mg total) by mouth daily., Disp: 30 tablet, Rfl: 3 .  carvedilol (COREG) 12.5 MG tablet, TAKE 1 TABLET BY MOUTH TWICE DAILY., Disp: 60 tablet, Rfl: 3 .  cilostazol (PLETAL) 100 MG tablet, TAKE 1 TABLET TWICE DAILY., Disp: 180 tablet, Rfl: 3 .  ELIQUIS 2.5 MG TABS tablet, TAKE 1 TABLET BY MOUTH TWICE DAILY., Disp: 60 tablet, Rfl: 11 .  ezetimibe (ZETIA) 10 MG tablet, TAKE (1) TABLET DAILY., Disp: 90 tablet, Rfl: 0 .  FLUoxetine (PROZAC) 20 MG capsule, TAKE (1) CAPSULE DAILY., Disp: 90 capsule, Rfl: 0 .  furosemide (LASIX) 80  MG tablet, Take 1.5 tablets (120 mg total) by mouth 2 (two) times daily., Disp: 75 tablet, Rfl: 6 .  glucose blood (ACCU-CHEK AVIVA PLUS) test strip, CHECK BLOOD SUGAR 4 TIMES A DAY., Disp: 100 each, Rfl: 11 .  hydrALAZINE (APRESOLINE) 25 MG tablet, Take 1 tablet (25 mg total) by mouth 3 (three) times daily. D/c order for Losartan., Disp: 90 tablet, Rfl: 3 .  HYDROcodone-acetaminophen (NORCO/VICODIN) 5-325 MG tablet, Take 1 tablet by mouth every 4 (four) hours as needed for moderate pain., Disp: 24 tablet, Rfl: 0 .  Investigational - Study Medication, Take 1 tablet by mouth daily. Study name: [place]  Additional study details: MK-1242 (vericiguat) or Placebo, Disp: 1 each, Rfl: PRN .  Lancets MISC, Check blood sugar 4 times a day, Disp: 100 each, Rfl: 11 .  levothyroxine (SYNTHROID, LEVOTHROID) 112 MCG tablet, Take 1 tablet (112 mcg total) by mouth daily., Disp: 90 tablet, Rfl: 3 .  Multiple Vitamin (MULTIVITAMIN) tablet, Take 1 tablet by mouth daily., Disp: , Rfl:  .  NITROSTAT 0.4 MG SL tablet, DISSOLVE 1 TABLET UNDER TONGUE AS NEEDED FOR CHEST PAIN,MAY REPEAT IN5 MINUTES FOR 2 DOSES., Disp: 25  tablet, Rfl: 6 .  potassium chloride 20 MEQ TBCR, Take 20 mEq by mouth daily., Disp: 30 tablet, Rfl: 3 .  temazepam (RESTORIL) 30 MG capsule, TAKE 1 CAPSULE AT BEDTIME AS NEEDED FOR SLEEP., Disp: 30 capsule, Rfl: 0 .  tiotropium (SPIRIVA HANDIHALER) 18 MCG inhalation capsule, Place 1 capsule (18 mcg total) into inhaler and inhale daily., Disp: 30 capsule, Rfl: 12 .  traMADol (ULTRAM) 50 MG tablet, Take 1 tablet (50 mg total) by mouth every 6 (six) hours as needed., Disp: 12 tablet, Rfl: 0

## 2018-05-02 NOTE — Patient Instructions (Signed)
Congestive heart failure: Keep follow-up with the heart failure clinic Continue diuretics (Lasix) as directed by the heart failure clinic  Bronchiectasis: This term means that you have scarring in your lungs which makes you susceptible to respiratory infections If you have a cough with mucus production please call sooner rather than later so that we can start an antibiotic  We will see you back in 1 year or sooner if needed

## 2018-05-03 ENCOUNTER — Other Ambulatory Visit (HOSPITAL_COMMUNITY): Payer: Self-pay

## 2018-05-03 ENCOUNTER — Other Ambulatory Visit (HOSPITAL_COMMUNITY): Payer: Self-pay | Admitting: Cardiology

## 2018-05-03 NOTE — Progress Notes (Signed)
Paramedicine Encounter    Patient ID: Andrew [name], male    DOB: 01/17/1928, 84 y.o.   MRN: [id number]   Patient Care Team: [name], MD as PCP - General (Family Medicine) Larey Dresser, MD as Consulting Physician (Cardiology)  Patient Active Problem List   Diagnosis Date Noted  . Bilateral recurrent inguinal hernias 11/28/2017  . Calf pain 05/25/2016  . Carotid artery stenosis 01/12/2016  . Chronic systolic CHF (congestive heart failure) ([place]) 12/02/2015  . Cerebellar infarct ([place]) 10/16/2015  . 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 ([place]) 10/16/2015  . PVD (peripheral vascular disease) ([place])   . Hereditary and idiopathic peripheral neuropathy 10/02/2015  . Peripelvic (lymphatic) cyst   . Pernicious anemia 07/15/2015  . Bilateral renal cysts 07/12/2015  . Lung nodule, 47mm RML CT 07/12/15 07/12/2015  . Constipation 07/12/2015  . Kidney stone on right side 07/12/2015  . Stage 3 chronic kidney disease ([place]) 06/14/2015  . Chronic diastolic CHF (congestive heart failure) ([place]) 05/28/2015  . Paroxysmal atrial fibrillation ([place]) 05/28/2015  . AAA (abdominal aortic aneurysm) without rupture ([place]) 09/30/2014  . Left-sided low back pain without sciatica 07/21/2014  . Cardiomyopathy, ischemic 05/23/2014  . DM (diabetes mellitus), type 2 with renal complications ([place]) 24/23/5361  . CORONARY ATHEROSCLEROSIS NATIVE CORONARY ARTERY 11/09/2010  . Depression, major, single episode, complete remission ([place]) 03/18/2010  . Hypothyroidism 02/03/2009  . Hyperlipidemia associated with type 2 diabetes mellitus ([place]) 06/14/2007  . Hypertension associated with diabetes ([place]) 06/14/2007  . GERD 06/14/2007    Current Outpatient Medications:  .  acetaminophen (TYLENOL) 500 MG tablet, Take 1,000 mg by mouth every 8 (eight) hours as needed for moderate pain. , Disp: , Rfl:  .  ALPRAZolam (XANAX) 0.5 MG tablet, TAKE 1/2 TABLET TWICE DAILY AS NEEDED FOR ANXIETY., Disp: 60 tablet,  Rfl: 5 .  amiodarone (PACERONE) 100 MG tablet, Take 1 tablet (100 mg total) by mouth daily., Disp: 30 tablet, Rfl: 3 .  atorvastatin (LIPITOR) 80 MG tablet, Take 1 tablet (80 mg total) by mouth daily., Disp: 30 tablet, Rfl: 3 .  carvedilol (COREG) 12.5 MG tablet, TAKE 1 TABLET BY MOUTH TWICE DAILY., Disp: 60 tablet, Rfl: 3 .  cilostazol (PLETAL) 100 MG tablet, TAKE 1 TABLET TWICE DAILY., Disp: 180 tablet, Rfl: 3 .  ELIQUIS 2.5 MG TABS tablet, TAKE 1 TABLET BY MOUTH TWICE DAILY., Disp: 60 tablet, Rfl: 11 .  ezetimibe (ZETIA) 10 MG tablet, TAKE (1) TABLET DAILY., Disp: 90 tablet, Rfl: 0 .  FLUoxetine (PROZAC) 20 MG capsule, TAKE (1) CAPSULE DAILY., Disp: 90 capsule, Rfl: 0 .  furosemide (LASIX) 80 MG tablet, Take 1.5 tablets (120 mg total) by mouth 2 (two) times daily., Disp: 75 tablet, Rfl: 6 .  glucose blood (ACCU-CHEK AVIVA PLUS) test strip, CHECK BLOOD SUGAR 4 TIMES A DAY., Disp: 100 each, Rfl: 11 .  hydrALAZINE (APRESOLINE) 25 MG tablet, Take 1 tablet (25 mg total) by mouth 3 (three) times daily. D/c order for Losartan., Disp: 90 tablet, Rfl: 3 .  HYDROcodone-acetaminophen (NORCO/VICODIN) 5-325 MG tablet, Take 1 tablet by mouth every 4 (four) hours as needed for moderate pain., Disp: 24 tablet, Rfl: 0 .  Investigational - Study Medication, Take 1 tablet by mouth daily. Study name: [place]  Additional study details: MK-1242 (vericiguat) or Placebo, Disp: 1 each, Rfl: PRN .  Lancets MISC, Check blood sugar 4 times a day, Disp: 100 each, Rfl: 11 .  levothyroxine (SYNTHROID, LEVOTHROID) 112 MCG tablet, Take 1 tablet (  112 mcg total) by mouth daily., Disp: 90 tablet, Rfl: 3 .  Multiple Vitamin (MULTIVITAMIN) tablet, Take 1 tablet by mouth daily., Disp: , Rfl:  .  NITROSTAT 0.4 MG SL tablet, DISSOLVE 1 TABLET UNDER TONGUE AS NEEDED FOR CHEST PAIN,MAY REPEAT IN5 MINUTES FOR 2 DOSES., Disp: 25 tablet, Rfl: 6 .  potassium chloride 20 MEQ TBCR, Take 20 mEq by mouth daily., Disp: 30 tablet, Rfl: 3 .   temazepam (RESTORIL) 30 MG capsule, TAKE 1 CAPSULE AT BEDTIME AS NEEDED FOR SLEEP., Disp: 30 capsule, Rfl: 0 .  tiotropium (SPIRIVA HANDIHALER) 18 MCG inhalation capsule, Place 1 capsule (18 mcg total) into inhaler and inhale daily., Disp: 30 capsule, Rfl: 12 .  traMADol (ULTRAM) 50 MG tablet, Take 1 tablet (50 mg total) by mouth every 6 (six) hours as needed., Disp: 12 tablet, Rfl: 0 Allergies  Allergen Reactions  . Metoprolol Shortness Of Breath, Palpitations and Other (See Comments)    Heart starts racing. Shallow breathing   . Metformin And Related Nausea And Vomiting      Social History   Socioeconomic History  . Marital status: Married    Spouse name: Not on file  . Number of children: Not on file  . Years of education: Not on file  . Highest education level: Not on file  Occupational History  . 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 Financial resource strain: Not on file  . Food insecurity:    Worry: Not on file    Inability: Not on file  . Transportation needs:    Medical: Not on file    Non-medical: Not on file  Tobacco Use  . Smoking status: Former Smoker    Packs/day: 1.50    Years: 35.00    Pack years: 52.50    Types: Cigarettes  . Smokeless tobacco: Never Used  . Tobacco comment: quit atleast 25 yrs ago, per pt  Substance and Sexual Activity  . Alcohol use: Yes    Alcohol/week: 1.8 oz    Types: 3 Shots of liquor per week    Comment: socially  . Drug use: No  . Sexual activity: Yes  Lifestyle  . Physical activity:    Days per week: Not on file    Minutes per session: Not on file  . Stress: Not on file  Relationships  . Social connections:    Talks on phone: Not on file    Gets together: Not on file    Attends religious service: Not on file    Active member of club or organization: Not on file    Attends meetings of clubs or organizations: Not on file    Relationship status: Not on file  . Intimate partner violence:    Fear of current or ex partner:  Not on file    Emotionally abused: Not on file    Physically abused: Not on file    Forced sexual activity: Not on file  Other Topics Concern  . Not on file  Social History Narrative   Pt lives in [place] with spouse.   Retired from [employer].  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    Physical Exam      Future Appointments  Date Time Provider [place]  06/06/2018  9:30 AM [facility] LAB [facility] PEC  06/11/2018  2:20 PM Larey Dresser, MD [facility] None  07/06/2018  2:00 PM MC-CV HS VASC 4 - SS [facility]-H[facility]I VVS  07/06/2018  2:45 PM  Nickel, Sharmon Leyden, NP [facility]  07/10/2018  3:15 PM Gardiner Barefoot, DPM [facility]  07/18/2018  1:00 PM [place] 2 [facility]-CV[facility] None    There were no vitals taken for this visit.  Weight yesterday-190 @ doc office  Last visit weight-191  Pt reports he feels fine, he was giving piano lesson when I arrived.  meds verified and pill box refilled. He missed 2 doses of his pm meds. Ordered 3 meds for him to be p/u-hydralazine, lasix, potassium. Pt didn't want to have his b/p checked since he was in mid of piano lesson however he was just seen at doc yesterday and it was checked and within normal limits then. Will see him next week.    Marylouise Stacks, [place] [facility] Paramedic  05/03/18

## 2018-05-10 ENCOUNTER — Other Ambulatory Visit (HOSPITAL_COMMUNITY): Payer: Self-pay

## 2018-05-10 ENCOUNTER — Other Ambulatory Visit: Payer: Self-pay | Admitting: Family Medicine

## 2018-05-10 NOTE — Progress Notes (Signed)
Paramedicine Encounter    Patient ID: Andrew [name], male    DOB: 1928/04/03, 84 y.o.   MRN: [id number]   Patient Care Team: [name], MD as PCP - General (Family Medicine) Larey Dresser, MD as Consulting Physician (Cardiology)  Patient Active Problem List   Diagnosis Date Noted  . Bilateral recurrent inguinal hernias 11/28/2017  . Calf pain 05/25/2016  . Carotid artery stenosis 01/12/2016  . Chronic systolic CHF (congestive heart failure) ([place]) 12/02/2015  . Cerebellar infarct ([place]) 10/16/2015  . 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 PVD (peripheral vascular disease) ([place])   . Hereditary and idiopathic peripheral neuropathy 10/02/2015  . Peripelvic (lymphatic) cyst   . Pernicious anemia 07/15/2015  . Bilateral renal cysts 07/12/2015  . Lung nodule, 65mm RML CT 07/12/15 07/12/2015  . Constipation 07/12/2015  . Kidney stone on right side 07/12/2015  . Stage 3 chronic kidney disease ([place]) 06/14/2015  . Chronic diastolic CHF (congestive heart failure) ([place]) 05/28/2015  . Paroxysmal atrial fibrillation ([place]) 05/28/2015  . AAA (abdominal aortic aneurysm) without rupture ([place]) 09/30/2014  . Left-sided low back pain without sciatica 07/21/2014  . Cardiomyopathy, ischemic 05/23/2014  . DM (diabetes mellitus), type 2 with renal complications ([place]) 82/42/3536  . CORONARY ATHEROSCLEROSIS NATIVE CORONARY ARTERY 11/09/2010  . Depression, major, single episode, complete remission ([place]) 03/18/2010  . Hypothyroidism 02/03/2009  . Hyperlipidemia associated with type 2 diabetes mellitus ([place]) 06/14/2007  . Hypertension associated with diabetes ([place]) 06/14/2007  . GERD 06/14/2007    Current Outpatient Medications:  .  acetaminophen (TYLENOL) 500 MG tablet, Take 1,000 mg by mouth every 8 (eight) hours as needed for moderate pain. , Disp: , Rfl:  .  amiodarone (PACERONE) 100 MG tablet, Take 1 tablet (100 mg total) by mouth daily., Disp: 30 tablet, Rfl:  3 .  atorvastatin (LIPITOR) 80 MG tablet, Take 1 tablet (80 mg total) by mouth daily., Disp: 30 tablet, Rfl: 3 .  carvedilol (COREG) 12.5 MG tablet, TAKE 1 TABLET BY MOUTH TWICE DAILY., Disp: 60 tablet, Rfl: 3 .  cilostazol (PLETAL) 100 MG tablet, TAKE 1 TABLET TWICE DAILY., Disp: 180 tablet, Rfl: 3 .  ELIQUIS 2.5 MG TABS tablet, TAKE 1 TABLET BY MOUTH TWICE DAILY., Disp: 60 tablet, Rfl: 11 .  ezetimibe (ZETIA) 10 MG tablet, TAKE (1) TABLET DAILY., Disp: 90 tablet, Rfl: 0 .  FLUoxetine (PROZAC) 20 MG capsule, TAKE (1) CAPSULE DAILY., Disp: 90 capsule, Rfl: 0 .  furosemide (LASIX) 80 MG tablet, Take 1.5 tablets (120 mg total) by mouth 2 (two) times daily., Disp: 75 tablet, Rfl: 6 .  hydrALAZINE (APRESOLINE) 25 MG tablet, Take 1 tablet (25 mg total) by mouth 3 (three) times daily. D/c order for Losartan., Disp: 90 tablet, Rfl: 3 .  Investigational - Study Medication, Take 1 tablet by mouth daily. Study name: [place]  Additional study details: MK-1242 (vericiguat) or Placebo, Disp: 1 each, Rfl: PRN .  levothyroxine (SYNTHROID, LEVOTHROID) 112 MCG tablet, Take 1 tablet (112 mcg total) by mouth daily., Disp: 90 tablet, Rfl: 3 .  Multiple Vitamin (MULTIVITAMIN) tablet, Take 1 tablet by mouth daily., Disp: , Rfl:  .  potassium chloride 20 MEQ TBCR, Take 20 mEq by mouth daily., Disp: 30 tablet, Rfl: 3 .  tiotropium (SPIRIVA HANDIHALER) 18 MCG inhalation capsule, Place 1 capsule (18 mcg total) into inhaler and inhale daily., Disp: 30 capsule, Rfl: 12 .  ALPRAZolam (XANAX) 0.5 MG tablet, TAKE 1/2 TABLET TWICE DAILY AS NEEDED  FOR ANXIETY. (Patient not taking: Reported on 05/03/2018), Disp: 60 tablet, Rfl: 5 .  glucose blood (ACCU-CHEK AVIVA PLUS) test strip, CHECK BLOOD SUGAR 4 TIMES A DAY. (Patient not taking: Reported on 05/03/2018), Disp: 100 each, Rfl: 11 .  HYDROcodone-acetaminophen (NORCO/VICODIN) 5-325 MG tablet, Take 1 tablet by mouth every 4 (four) hours as needed for moderate pain. (Patient not taking:  Reported on 05/03/2018), Disp: 24 tablet, Rfl: 0 .  Lancets MISC, Check blood sugar 4 times a day (Patient not taking: Reported on 05/03/2018), Disp: 100 each, Rfl: 11 .  NITROSTAT 0.4 MG SL tablet, DISSOLVE 1 TABLET UNDER TONGUE AS NEEDED FOR CHEST PAIN,MAY REPEAT IN5 MINUTES FOR 2 DOSES. (Patient not taking: Reported on 05/03/2018), Disp: 25 tablet, Rfl: 6 .  potassium chloride SA (K-DUR,KLOR-CON) 20 MEQ tablet, TAKE 1 TABLET ONCE DAILY. (Patient not taking: Reported on 05/10/2018), Disp: 30 tablet, Rfl: 0 .  temazepam (RESTORIL) 30 MG capsule, TAKE 1 CAPSULE AT BEDTIME AS NEEDED FOR SLEEP. (Patient not taking: Reported on 05/03/2018), Disp: 30 capsule, Rfl: 0 .  traMADol (ULTRAM) 50 MG tablet, Take 1 tablet (50 mg total) by mouth every 6 (six) hours as needed. (Patient not taking: Reported on 05/03/2018), Disp: 12 tablet, Rfl: 0 Allergies  Allergen Reactions  . Metoprolol Shortness Of Breath, Palpitations and Other (See Comments)    Heart starts racing. Shallow breathing   . Metformin And Related Nausea And Vomiting      Social History   Socioeconomic History  . Marital status: Married    Spouse name: Not on file  . Number of children: Not on file  . Years of education: Not on file  . Highest education level: Not on file  Occupational History  . 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 Financial resource strain: Not on file  . Food insecurity:    Worry: Not on file    Inability: Not on file  . Transportation needs:    Medical: Not on file    Non-medical: Not on file  Tobacco Use  . Smoking status: Former Smoker    Packs/day: 1.50    Years: 35.00    Pack years: 52.50    Types: Cigarettes  . Smokeless tobacco: Never Used  . Tobacco comment: quit atleast 25 yrs ago, per pt  Substance and Sexual Activity  . Alcohol use: Yes    Alcohol/week: 1.8 oz    Types: 3 Shots of liquor per week    Comment: socially  . Drug use: No  . Sexual activity: Yes  Lifestyle  . Physical activity:     Days per week: Not on file    Minutes per session: Not on file  . Stress: Not on file  Relationships  . Social connections:    Talks on phone: Not on file    Gets together: Not on file    Attends religious service: Not on file    Active member of club or organization: Not on file    Attends meetings of clubs or organizations: Not on file    Relationship status: Not on file  . Intimate partner violence:    Fear of current or ex partner: Not on file    Emotionally abused: Not on file    Physically abused: Not on file    Forced sexual activity: Not on file  Other Topics Concern  . Not on file  Social History Narrative   Pt lives in [place] with spouse.   Retired from [employer].  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    Physical Exam      Future Appointments  Date Time Provider [place]  06/06/2018  9:30 AM [facility]PC LAB [facility] PEC  06/11/2018  2:20 PM Larey Dresser, MD [facility] None  07/06/2018  2:00 PM MC-CV HS VASC 4 - SS [facility]CVI VVS  07/06/2018  2:45 PM Nickel, Sharmon Leyden, NP [facility] VVS  07/10/2018  3:15 PM Gardiner Barefoot, DPM [facility]  07/18/2018  1:00 PM [place] 2 [facility] None    BP 130/74   Pulse 74   Resp 15   Wt 187 lb (84.8 kg)   SpO2 95%   BMI 29.29 kg/m   Weight yesterday-? Last visit weight-191-few wks ago  Pt still in bed when I arrived at 1030 for a visit this morning, he has not taken his meds yet. He has missed 4 doses of his night time meds this past week and he missed 2 afternoon doses of meds last week.  meds verified and pill box refilled. Once pt got up from bed he took a shower prior to coming down for our visit.   Spoke with pharmacy and they are going to sync his meds up for monthly fills to 90 day supply to get everything filled at once vs her going back and forth.   Advised pt he needs to remember to take his meds daily and at same time if  at all possible and advised him of the risks of missing his meds.  He took his meds during our visit. Pt denies any increased sob, no dizziness, no h/a, no c/p. His weight is actually down a few lbs from a few wks ago. Pt overall feels good.   Marylouise Stacks, [facility] Paramedic  05/10/18

## 2018-05-14 ENCOUNTER — Other Ambulatory Visit (HOSPITAL_COMMUNITY): Payer: Self-pay | Admitting: Cardiology

## 2018-05-17 ENCOUNTER — Other Ambulatory Visit (HOSPITAL_COMMUNITY): Payer: Self-pay

## 2018-05-17 NOTE — Progress Notes (Signed)
Paramedicine Encounter    Patient ID: Andrew [name], male    DOB: 01-Nov-1928, 84 y.o.   MRN: [id number]   Patient Care Team: [name], MD as PCP - General (Family Medicine) Larey Dresser, MD as Consulting Physician (Cardiology)  Patient Active Problem List   Diagnosis Date Noted  . Bilateral recurrent inguinal hernias 11/28/2017  . Calf pain 05/25/2016  . Carotid artery stenosis 01/12/2016  . Chronic systolic CHF (congestive heart failure) ([place]) 12/02/2015  . Cerebellar infarct ([place]) 10/16/2015  . 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 PVD (peripheral vascular disease) ([place])   . Hereditary and idiopathic peripheral neuropathy 10/02/2015  . Peripelvic (lymphatic) cyst   . Pernicious anemia 07/15/2015  . Bilateral renal cysts 07/12/2015  . Lung nodule, 17mm RML CT 07/12/15 07/12/2015  . Constipation 07/12/2015  . Kidney stone on right side 07/12/2015  . Stage 3 chronic kidney disease ([place]) 06/14/2015  . Chronic diastolic CHF (congestive heart failure) ([place]) 05/28/2015  . Paroxysmal atrial fibrillation ([place]) 05/28/2015  . AAA (abdominal aortic aneurysm) without rupture ([place]) 09/30/2014  . Left-sided low back pain without sciatica 07/21/2014  . Cardiomyopathy, ischemic 05/23/2014  . DM (diabetes mellitus), type 2 with renal complications ([place]) 96/78/9381  . CORONARY ATHEROSCLEROSIS NATIVE CORONARY ARTERY 11/09/2010  . Depression, major, single episode, complete remission ([place]) 03/18/2010  . Hypothyroidism 02/03/2009  . Hyperlipidemia associated with type 2 diabetes mellitus ([place]) 06/14/2007  . Hypertension associated with diabetes ([place]) 06/14/2007  . GERD 06/14/2007    Current Outpatient Medications:  .  amiodarone (PACERONE) 100 MG tablet, Take 1 tablet (100 mg total) by mouth daily., Disp: 30 tablet, Rfl: 3 .  atorvastatin (LIPITOR) 80 MG tablet, Take 1 tablet (80 mg total) by mouth daily., Disp: 30 tablet, Rfl: 3 .  carvedilol (COREG)  12.5 MG tablet, TAKE 1 TABLET BY MOUTH TWICE DAILY., Disp: 180 tablet, Rfl: 0 .  cilostazol (PLETAL) 100 MG tablet, TAKE 1 TABLET TWICE DAILY., Disp: 180 tablet, Rfl: 3 .  ELIQUIS 2.5 MG TABS tablet, TAKE 1 TABLET BY MOUTH TWICE DAILY., Disp: 60 tablet, Rfl: 11 .  ezetimibe (ZETIA) 10 MG tablet, TAKE (1) TABLET DAILY., Disp: 90 tablet, Rfl: 0 .  FLUoxetine (PROZAC) 20 MG capsule, TAKE (1) CAPSULE DAILY., Disp: 90 capsule, Rfl: 0 .  furosemide (LASIX) 80 MG tablet, Take 1.5 tablets (120 mg total) by mouth 2 (two) times daily., Disp: 75 tablet, Rfl: 6 .  hydrALAZINE (APRESOLINE) 25 MG tablet, Take 1 tablet (25 mg total) by mouth 3 (three) times daily. D/c order for Losartan., Disp: 90 tablet, Rfl: 3 .  Investigational - Study Medication, Take 1 tablet by mouth daily. Study name: [place]  Additional study details: MK-1242 (vericiguat) or Placebo, Disp: 1 each, Rfl: PRN .  levothyroxine (SYNTHROID, LEVOTHROID) 112 MCG tablet, Take 1 tablet (112 mcg total) by mouth daily., Disp: 90 tablet, Rfl: 3 .  Multiple Vitamin (MULTIVITAMIN) tablet, Take 1 tablet by mouth daily., Disp: , Rfl:  .  potassium chloride 20 MEQ TBCR, Take 20 mEq by mouth daily., Disp: 30 tablet, Rfl: 3 .  tiotropium (SPIRIVA HANDIHALER) 18 MCG inhalation capsule, Place 1 capsule (18 mcg total) into inhaler and inhale daily., Disp: 30 capsule, Rfl: 12 .  acetaminophen (TYLENOL) 500 MG tablet, Take 1,000 mg by mouth every 8 (eight) hours as needed for moderate pain. , Disp: , Rfl:  .  ALPRAZolam (XANAX) 0.5 MG tablet, TAKE 1/2 TABLET TWICE DAILY AS NEEDED  FOR ANXIETY. (Patient not taking: Reported on 05/03/2018), Disp: 60 tablet, Rfl: 5 .  glucose blood (ACCU-CHEK AVIVA PLUS) test strip, CHECK BLOOD SUGAR 4 TIMES A DAY. (Patient not taking: Reported on 05/03/2018), Disp: 100 each, Rfl: 11 .  HYDROcodone-acetaminophen (NORCO/VICODIN) 5-325 MG tablet, Take 1 tablet by mouth every 4 (four) hours as needed for moderate pain. (Patient not taking:  Reported on 05/03/2018), Disp: 24 tablet, Rfl: 0 .  Lancets MISC, Check blood sugar 4 times a day (Patient not taking: Reported on 05/03/2018), Disp: 100 each, Rfl: 11 .  NITROSTAT 0.4 MG SL tablet, DISSOLVE 1 TABLET UNDER TONGUE AS NEEDED FOR CHEST PAIN,MAY REPEAT IN5 MINUTES FOR 2 DOSES. (Patient not taking: Reported on 05/03/2018), Disp: 25 tablet, Rfl: 6 .  potassium chloride SA (K-DUR,KLOR-CON) 20 MEQ tablet, TAKE 1 TABLET ONCE DAILY. (Patient not taking: Reported on 05/10/2018), Disp: 30 tablet, Rfl: 0 .  temazepam (RESTORIL) 30 MG capsule, TAKE 1 CAPSULE AT BEDTIME AS NEEDED FOR SLEEP. (Patient not taking: Reported on 05/03/2018), Disp: 30 capsule, Rfl: 0 .  traMADol (ULTRAM) 50 MG tablet, Take 1 tablet (50 mg total) by mouth every 6 (six) hours as needed. (Patient not taking: Reported on 05/03/2018), Disp: 12 tablet, Rfl: 0 Allergies  Allergen Reactions  . Metoprolol Shortness Of Breath, Palpitations and Other (See Comments)    Heart starts racing. Shallow breathing   . Metformin And Related Nausea And Vomiting      Social History   Socioeconomic History  . Marital status: Married    Spouse name: Not on file  . Number of children: Not on file  . Years of education: Not on file  . Highest education level: Not on file  Occupational History  . 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 Financial resource strain: Not on file  . Food insecurity:    Worry: Not on file    Inability: Not on file  . Transportation needs:    Medical: Not on file    Non-medical: Not on file  Tobacco Use  . Smoking status: Former Smoker    Packs/day: 1.50    Years: 35.00    Pack years: 52.50    Types: Cigarettes  . Smokeless tobacco: Never Used  . Tobacco comment: quit atleast 25 yrs ago, per pt  Substance and Sexual Activity  . Alcohol use: Yes    Alcohol/week: 1.8 oz    Types: 3 Shots of liquor per week    Comment: socially  . Drug use: No  . Sexual activity: Yes  Lifestyle  . Physical activity:     Days per week: Not on file    Minutes per session: Not on file  . Stress: Not on file  Relationships  . Social connections:    Talks on phone: Not on file    Gets together: Not on file    Attends religious service: Not on file    Active member of club or organization: Not on file    Attends meetings of clubs or organizations: Not on file    Relationship status: Not on file  . Intimate partner violence:    Fear of current or ex partner: Not on file    Emotionally abused: Not on file    Physically abused: Not on file    Forced sexual activity: Not on file  Other Topics Concern  . Not on file  Social History Narrative   Pt lives in [place] with spouse.   Retired from [employer].  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    Physical Exam      Future Appointments  Date Time Provider [place]  06/06/2018  9:30 AM [facility] LAB [facility] PEC  06/11/2018  2:20 PM Larey Dresser, MD [facility] None  06/13/2018  3:00 PM [place] 2 [facility] None  07/06/2018  2:00 PM MC-CV HS VASC 4 - SS [facility] VVS  07/06/2018  2:45 PM Nickel, Sharmon Leyden, NP [facility]  07/10/2018  3:15 PM Gardiner Barefoot, DPM [facility]    BP 112/64   Pulse 76   Resp 15   Wt 188 lb (85.3 kg)   SpO2 90%   BMI 29.44 kg/m   Weight yesterday-? Last visit weight-187  Pt reports he is doing well, he didn't miss any med doses this week!!  meds verified and pill box refilled. Pt denies any sob, no dizziness, no bleeding issues. Wife will pick up refills next week. No swelling noted.    Marylouise Stacks, [place] [facility] Paramedic  05/17/18

## 2018-05-23 ENCOUNTER — Other Ambulatory Visit: Payer: Self-pay | Admitting: Family Medicine

## 2018-05-23 ENCOUNTER — Other Ambulatory Visit (HOSPITAL_COMMUNITY): Payer: Self-pay | Admitting: Cardiology

## 2018-05-24 ENCOUNTER — Other Ambulatory Visit (HOSPITAL_COMMUNITY): Payer: Self-pay

## 2018-05-24 NOTE — Progress Notes (Signed)
Paramedicine Encounter    Patient ID: Andrew [name], male    DOB: 1928-05-16, 84 y.o.   MRN: [id number]   Patient Care Team: [name], MD as PCP - General (Family Medicine) Larey Dresser, MD as Consulting Physician (Cardiology)  Patient Active Problem List   Diagnosis Date Noted  . Bilateral recurrent inguinal hernias 11/28/2017  . Calf pain 05/25/2016  . Carotid artery stenosis 01/12/2016  . Chronic systolic CHF (congestive heart failure) ([place]) 12/02/2015  . Cerebellar infarct ([place]) 10/16/2015  . 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 PVD (peripheral vascular disease) ([place])   . Hereditary and idiopathic peripheral neuropathy 10/02/2015  . Peripelvic (lymphatic) cyst   . Pernicious anemia 07/15/2015  . Bilateral renal cysts 07/12/2015  . Lung nodule, 73mm RML CT 07/12/15 07/12/2015  . Constipation 07/12/2015  . Kidney stone on right side 07/12/2015  . Stage 3 chronic kidney disease ([place]) 06/14/2015  . Chronic diastolic CHF (congestive heart failure) ([place]) 05/28/2015  . Paroxysmal atrial fibrillation ([place]) 05/28/2015  . AAA (abdominal aortic aneurysm) without rupture ([place]) 09/30/2014  . Left-sided low back pain without sciatica 07/21/2014  . Cardiomyopathy, ischemic 05/23/2014  . DM (diabetes mellitus), type 2 with renal complications ([place]) 08/65/7846  . CORONARY ATHEROSCLEROSIS NATIVE CORONARY ARTERY 11/09/2010  . Depression, major, single episode, complete remission ([place]) 03/18/2010  . Hypothyroidism 02/03/2009  . Hyperlipidemia associated with type 2 diabetes mellitus ([place]) 06/14/2007  . Hypertension associated with diabetes ([place]) 06/14/2007  . GERD 06/14/2007    Current Outpatient Medications:  .  acetaminophen (TYLENOL) 500 MG tablet, Take 1,000 mg by mouth every 8 (eight) hours as needed for moderate pain. , Disp: , Rfl:  .  amiodarone (PACERONE) 100 MG tablet, Take 1 tablet (100 mg total) by mouth daily., Disp: 30 tablet, Rfl:  3 .  carvedilol (COREG) 12.5 MG tablet, TAKE 1 TABLET BY MOUTH TWICE DAILY., Disp: 180 tablet, Rfl: 0 .  cilostazol (PLETAL) 100 MG tablet, TAKE 1 TABLET TWICE DAILY., Disp: 180 tablet, Rfl: 3 .  ELIQUIS 2.5 MG TABS tablet, TAKE 1 TABLET BY MOUTH TWICE DAILY., Disp: 60 tablet, Rfl: 11 .  ezetimibe (ZETIA) 10 MG tablet, TAKE (1) TABLET DAILY., Disp: 90 tablet, Rfl: 0 .  FLUoxetine (PROZAC) 20 MG capsule, TAKE (1) CAPSULE DAILY., Disp: 90 capsule, Rfl: 0 .  ALPRAZolam (XANAX) 0.5 MG tablet, TAKE 1/2 TABLET TWICE DAILY AS NEEDED FOR ANXIETY. (Patient not taking: Reported on 05/03/2018), Disp: 60 tablet, Rfl: 5 .  atorvastatin (LIPITOR) 80 MG tablet, Take 1 tablet (80 mg total) by mouth daily., Disp: 30 tablet, Rfl: 3 .  furosemide (LASIX) 80 MG tablet, Take 1.5 tablets (120 mg total) by mouth 2 (two) times daily., Disp: 75 tablet, Rfl: 6 .  glucose blood (ACCU-CHEK AVIVA PLUS) test strip, CHECK BLOOD SUGAR 4 TIMES A DAY. (Patient not taking: Reported on 05/03/2018), Disp: 100 each, Rfl: 11 .  hydrALAZINE (APRESOLINE) 25 MG tablet, Take 1 tablet (25 mg total) by mouth 3 (three) times daily. D/c order for Losartan., Disp: 90 tablet, Rfl: 3 .  hydrALAZINE (APRESOLINE) 25 MG tablet, TAKE 1 TABLET BY MOUTH 3 TIMES A DAY., Disp: 90 tablet, Rfl: 3 .  HYDROcodone-acetaminophen (NORCO/VICODIN) 5-325 MG tablet, Take 1 tablet by mouth every 4 (four) hours as needed for moderate pain. (Patient not taking: Reported on 05/03/2018), Disp: 24 tablet, Rfl: 0 .  Investigational - Study Medication, Take 1 tablet by mouth daily. Study name: [place]  Additional study details: MK-1242 (vericiguat) or Placebo, Disp: 1 each, Rfl: PRN .  Lancets MISC, Check blood sugar 4 times a day (Patient not taking: Reported on 05/03/2018), Disp: 100 each, Rfl: 11 .  levothyroxine (SYNTHROID, LEVOTHROID) 112 MCG tablet, Take 1 tablet (112 mcg total) by mouth daily., Disp: 90 tablet, Rfl: 3 .  Multiple Vitamin (MULTIVITAMIN) tablet, Take 1 tablet  by mouth daily., Disp: , Rfl:  .  NITROSTAT 0.4 MG SL tablet, DISSOLVE 1 TABLET UNDER TONGUE AS NEEDED FOR CHEST PAIN,MAY REPEAT IN5 MINUTES FOR 2 DOSES. (Patient not taking: Reported on 05/03/2018), Disp: 25 tablet, Rfl: 6 .  potassium chloride 20 MEQ TBCR, Take 20 mEq by mouth daily., Disp: 30 tablet, Rfl: 3 .  potassium chloride SA (K-DUR,KLOR-CON) 20 MEQ tablet, TAKE 1 TABLET ONCE DAILY. (Patient not taking: Reported on 05/10/2018), Disp: 30 tablet, Rfl: 0 .  temazepam (RESTORIL) 30 MG capsule, TAKE 1 CAPSULE AT BEDTIME AS NEEDED FOR SLEEP. (Patient not taking: Reported on 05/03/2018), Disp: 30 capsule, Rfl: 0 .  tiotropium (SPIRIVA HANDIHALER) 18 MCG inhalation capsule, Place 1 capsule (18 mcg total) into inhaler and inhale daily., Disp: 30 capsule, Rfl: 12 .  traMADol (ULTRAM) 50 MG tablet, Take 1 tablet (50 mg total) by mouth every 6 (six) hours as needed. (Patient not taking: Reported on 05/03/2018), Disp: 12 tablet, Rfl: 0 Allergies  Allergen Reactions  . Metoprolol Shortness Of Breath, Palpitations and Other (See Comments)    Heart starts racing. Shallow breathing   . Metformin And Related Nausea And Vomiting      Social History   Socioeconomic History  . Marital status: Married    Spouse name: Not on file  . Number of children: Not on file  . Years of education: Not on file  . Highest education level: Not on file  Occupational History  . 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 Financial resource strain: Not on file  . Food insecurity:    Worry: Not on file    Inability: Not on file  . Transportation needs:    Medical: Not on file    Non-medical: Not on file  Tobacco Use  . Smoking status: Former Smoker    Packs/day: 1.50    Years: 35.00    Pack years: 52.50    Types: Cigarettes  . Smokeless tobacco: Never Used  . Tobacco comment: quit atleast 25 yrs ago, per pt  Substance and Sexual Activity  . Alcohol use: Yes    Alcohol/week: 1.8 oz    Types: 3 Shots of liquor per week     Comment: socially  . Drug use: No  . Sexual activity: Yes  Lifestyle  . Physical activity:    Days per week: Not on file    Minutes per session: Not on file  . Stress: Not on file  Relationships  . Social connections:    Talks on phone: Not on file    Gets together: Not on file    Attends religious service: Not on file    Active member of club or organization: Not on file    Attends meetings of clubs or organizations: Not on file    Relationship status: Not on file  . Intimate partner violence:    Fear of current or ex partner: Not on file    Emotionally abused: Not on file    Physically abused: Not on file    Forced sexual activity: Not on file  Other Topics Concern  . Not  on file  Social History Narrative   Pt lives in [place] with spouse.   Retired from [employer].  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    Physical Exam      Future Appointments  Date Time Provider [place]  06/06/2018  9:30 AM [facility] LAB [facility] PEC  06/11/2018  2:20 PM Larey Dresser, MD [facility] None  06/13/2018  3:00 PM [place] 2 LBRE-CVRES None  07/06/2018  2:00 PM MC-CV HS VASC 4 - SS [facility]I VVS  07/06/2018  2:45 PM Nickel, Sharmon Leyden, NP [facility]  07/10/2018  3:15 PM Gardiner Barefoot, DPM [facility]    BP 126/70   Pulse 68   Resp 15   Wt 189 lb (85.7 kg)   SpO2 92%   BMI 29.60 kg/m    Last visit weight-188.   Pt reports he is feeling just fine, no sob, no dizziness, meds verified and pill box refilled. No missed doses of his meds this week which is a huge improvement. No swelling noted. Pt reports he is sleeping well at night. Will see him next week.   Marylouise Stacks, [place] [facility] Paramedic  05/24/18

## 2018-05-29 ENCOUNTER — Other Ambulatory Visit (HOSPITAL_COMMUNITY): Payer: Self-pay | Admitting: Cardiology

## 2018-05-31 ENCOUNTER — Other Ambulatory Visit (HOSPITAL_COMMUNITY): Payer: Self-pay

## 2018-05-31 NOTE — Progress Notes (Signed)
Paramedicine Encounter    Patient ID: Andrew [name], male    DOB: 03/11/1928, 84 y.o.   MRN: [id number]   Patient Care Team: [name], MD as PCP - General (Family Medicine) Larey Dresser, MD as Consulting Physician (Cardiology)  Patient Active Problem List   Diagnosis Date Noted  . Bilateral recurrent inguinal hernias 11/28/2017  . Calf pain 05/25/2016  . Carotid artery stenosis 01/12/2016  . Chronic systolic CHF (congestive heart failure) ([place]) 12/02/2015  . Cerebellar infarct ([place]) 10/16/2015  . 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 PVD (peripheral vascular disease) ([place])   . Hereditary and idiopathic peripheral neuropathy 10/02/2015  . Peripelvic (lymphatic) cyst   . Pernicious anemia 07/15/2015  . Bilateral renal cysts 07/12/2015  . Lung nodule, 60mm RML CT 07/12/15 07/12/2015  . Constipation 07/12/2015  . Kidney stone on right side 07/12/2015  . Stage 3 chronic kidney disease ([place]) 06/14/2015  . Chronic diastolic CHF (congestive heart failure) ([place]) 05/28/2015  . Paroxysmal atrial fibrillation ([place]) 05/28/2015  . AAA (abdominal aortic aneurysm) without rupture ([place]) 09/30/2014  . Left-sided low back pain without sciatica 07/21/2014  . Cardiomyopathy, ischemic 05/23/2014  . DM (diabetes mellitus), type 2 with renal complications ([place]) 40/98/1191  . CORONARY ATHEROSCLEROSIS NATIVE CORONARY ARTERY 11/09/2010  . Depression, major, single episode, complete remission ([place]) 03/18/2010  . Hypothyroidism 02/03/2009  . Hyperlipidemia associated with type 2 diabetes mellitus ([place]) 06/14/2007  . Hypertension associated with diabetes ([place]) 06/14/2007  . GERD 06/14/2007    Current Outpatient Medications:  .  acetaminophen (TYLENOL) 500 MG tablet, Take 1,000 mg by mouth every 8 (eight) hours as needed for moderate pain. , Disp: , Rfl:  .  amiodarone (PACERONE) 100 MG tablet, Take 1 tablet (100 mg total) by mouth daily., Disp: 30 tablet, Rfl:  3 .  carvedilol (COREG) 12.5 MG tablet, TAKE 1 TABLET BY MOUTH TWICE DAILY., Disp: 180 tablet, Rfl: 0 .  cilostazol (PLETAL) 100 MG tablet, TAKE 1 TABLET TWICE DAILY., Disp: 180 tablet, Rfl: 3 .  ELIQUIS 2.5 MG TABS tablet, TAKE 1 TABLET BY MOUTH TWICE DAILY., Disp: 60 tablet, Rfl: 11 .  ezetimibe (ZETIA) 10 MG tablet, TAKE (1) TABLET DAILY., Disp: 90 tablet, Rfl: 0 .  FLUoxetine (PROZAC) 20 MG capsule, TAKE (1) CAPSULE DAILY., Disp: 90 capsule, Rfl: 0 .  furosemide (LASIX) 80 MG tablet, Take 1.5 tablets (120 mg total) by mouth 2 (two) times daily., Disp: 75 tablet, Rfl: 6 .  hydrALAZINE (APRESOLINE) 25 MG tablet, Take 1 tablet (25 mg total) by mouth 3 (three) times daily. D/c order for Losartan., Disp: 90 tablet, Rfl: 3 .  hydrALAZINE (APRESOLINE) 25 MG tablet, TAKE 1 TABLET BY MOUTH 3 TIMES A DAY., Disp: 90 tablet, Rfl: 3 .  Investigational - Study Medication, Take 1 tablet by mouth daily. Study name: [place]  Additional study details: MK-1242 (vericiguat) or Placebo, Disp: 1 each, Rfl: PRN .  levothyroxine (SYNTHROID, LEVOTHROID) 112 MCG tablet, Take 1 tablet (112 mcg total) by mouth daily., Disp: 90 tablet, Rfl: 3 .  Multiple Vitamin (MULTIVITAMIN) tablet, Take 1 tablet by mouth daily., Disp: , Rfl:  .  potassium chloride 20 MEQ TBCR, Take 20 mEq by mouth daily., Disp: 30 tablet, Rfl: 3 .  tiotropium (SPIRIVA HANDIHALER) 18 MCG inhalation capsule, Place 1 capsule (18 mcg total) into inhaler and inhale daily., Disp: 30 capsule, Rfl: 12 .  ALPRAZolam (XANAX) 0.5 MG tablet, TAKE 1/2 TABLET TWICE DAILY AS NEEDED  FOR ANXIETY. (Patient not taking: Reported on 05/03/2018), Disp: 60 tablet, Rfl: 5 .  atorvastatin (LIPITOR) 80 MG tablet, Take 1 tablet (80 mg total) by mouth daily., Disp: 30 tablet, Rfl: 3 .  glucose blood (ACCU-CHEK AVIVA PLUS) test strip, CHECK BLOOD SUGAR 4 TIMES A DAY. (Patient not taking: Reported on 05/03/2018), Disp: 100 each, Rfl: 11 .  HYDROcodone-acetaminophen (NORCO/VICODIN) 5-325  MG tablet, Take 1 tablet by mouth every 4 (four) hours as needed for moderate pain. (Patient not taking: Reported on 05/03/2018), Disp: 24 tablet, Rfl: 0 .  Lancets MISC, Check blood sugar 4 times a day (Patient not taking: Reported on 05/03/2018), Disp: 100 each, Rfl: 11 .  NITROSTAT 0.4 MG SL tablet, DISSOLVE 1 TABLET UNDER TONGUE AS NEEDED FOR CHEST PAIN,MAY REPEAT IN5 MINUTES FOR 2 DOSES. (Patient not taking: Reported on 05/03/2018), Disp: 25 tablet, Rfl: 6 .  potassium chloride SA (K-DUR,KLOR-CON) 20 MEQ tablet, TAKE 1 TABLET ONCE DAILY. (Patient not taking: Reported on 05/10/2018), Disp: 30 tablet, Rfl: 0 .  potassium chloride SA (K-DUR,KLOR-CON) 20 MEQ tablet, TAKE 1 TABLET ONCE DAILY., Disp: 90 tablet, Rfl: 0 .  temazepam (RESTORIL) 30 MG capsule, TAKE 1 CAPSULE AT BEDTIME AS NEEDED FOR SLEEP. (Patient not taking: Reported on 05/03/2018), Disp: 30 capsule, Rfl: 0 .  traMADol (ULTRAM) 50 MG tablet, Take 1 tablet (50 mg total) by mouth every 6 (six) hours as needed. (Patient not taking: Reported on 05/03/2018), Disp: 12 tablet, Rfl: 0 Allergies  Allergen Reactions  . Metoprolol Shortness Of Breath, Palpitations and Other (See Comments)    Heart starts racing. Shallow breathing   . Metformin And Related Nausea And Vomiting      Social History   Socioeconomic History  . Marital status: Married    Spouse name: Not on file  . Number of children: Not on file  . Years of education: Not on file  . Highest education level: Not on file  Occupational History  . 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 Financial resource strain: Not on file  . Food insecurity:    Worry: Not on file    Inability: Not on file  . Transportation needs:    Medical: Not on file    Non-medical: Not on file  Tobacco Use  . Smoking status: Former Smoker    Packs/day: 1.50    Years: 35.00    Pack years: 52.50    Types: Cigarettes  . Smokeless tobacco: Never Used  . Tobacco comment: quit atleast 25 yrs ago, per pt   Substance and Sexual Activity  . Alcohol use: Yes    Alcohol/week: 1.8 oz    Types: 3 Shots of liquor per week    Comment: socially  . Drug use: No  . Sexual activity: Yes  Lifestyle  . Physical activity:    Days per week: Not on file    Minutes per session: Not on file  . Stress: Not on file  Relationships  . Social connections:    Talks on phone: Not on file    Gets together: Not on file    Attends religious service: Not on file    Active member of club or organization: Not on file    Attends meetings of clubs or organizations: Not on file    Relationship status: Not on file  . Intimate partner violence:    Fear of current or ex partner: Not on file    Emotionally abused: Not on file    Physically abused:  Not on file    Forced sexual activity: Not on file  Other Topics Concern  . Not on file  Social History Narrative   Pt lives in [place] with spouse.   Retired from [employer].  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    Physical Exam      Future Appointments  Date Time Provider [place]  06/06/2018  9:30 AM [facility] LAB [facility] PEC  06/11/2018  2:20 PM Larey Dresser, MD [facility] None  06/13/2018  3:00 PM [place] 2 LBRE-CVRES None  07/06/2018  2:00 PM MC-CV HS VASC 4 - SS [facility] VVS  07/06/2018  2:45 PM Nickel, Sharmon Leyden, NP [facility]  07/10/2018  3:15 PM Gardiner Barefoot, DPM [facility]    BP 134/70   Pulse 75   Resp 15   Wt 187 lb (84.8 kg)   SpO2 94%   BMI 29.29 kg/m   Weight yesterday-? Last visit weight-189  Pt reports that he is feeling good, no missed doses this week. He ran out of his multivitamin--meds verified and pill box refilled.  He denies any dizziness, no sob.    Marylouise Stacks, [place] [facility] Paramedic  05/31/18

## 2018-06-06 ENCOUNTER — Telehealth: Payer: Self-pay | Admitting: Family Medicine

## 2018-06-06 ENCOUNTER — Other Ambulatory Visit (INDEPENDENT_AMBULATORY_CARE_PROVIDER_SITE_OTHER): Payer: Medicare Other

## 2018-06-06 DIAGNOSIS — E039 Hypothyroidism, unspecified: Secondary | ICD-10-CM

## 2018-06-06 LAB — TSH: TSH: 10.34 u[IU]/mL — AB (ref 0.35–4.50)

## 2018-06-06 NOTE — Telephone Encounter (Signed)
The patient requests to know when Dr. Jerline Pain would like to see him back in the office.

## 2018-06-07 ENCOUNTER — Telehealth (HOSPITAL_COMMUNITY): Payer: Self-pay | Admitting: Cardiology

## 2018-06-07 ENCOUNTER — Other Ambulatory Visit (HOSPITAL_COMMUNITY): Payer: Self-pay

## 2018-06-07 MED ORDER — POTASSIUM CHLORIDE ER 20 MEQ PO TBCR: 20.0000 meq | EXTENDED_RELEASE_TABLET | Freq: Every day | ORAL | 3 refills | Status: DC

## 2018-06-07 NOTE — Progress Notes (Signed)
Paramedicine Encounter    Patient ID: Andrew [name], male    DOB: [birth year], 84 y.o.   MRN: [id number]   Patient Care Team: [name], MD as PCP - General (Family Medicine) Larey Dresser, MD as Consulting Physician (Cardiology)  Patient Active Problem List   Diagnosis Date Noted  . Bilateral recurrent inguinal hernias 11/28/2017  . Calf pain 05/25/2016  . Carotid artery stenosis 01/12/2016  . Chronic systolic CHF (congestive heart failure) ([place]) 12/02/2015  . Cerebellar infarct ([place]) 10/16/2015  . 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 PVD (peripheral vascular disease) ([place])   . Hereditary and idiopathic peripheral neuropathy 10/02/2015  . Peripelvic (lymphatic) cyst   . Pernicious anemia 07/15/2015  . Bilateral renal cysts 07/12/2015  . Lung nodule, 68mm RML CT 07/12/15 07/12/2015  . Constipation 07/12/2015  . Kidney stone on right side 07/12/2015  . Stage 3 chronic kidney disease ([place]) 06/14/2015  . Chronic diastolic CHF (congestive heart failure) ([place]) 05/28/2015  . Paroxysmal atrial fibrillation ([place]) 05/28/2015  . AAA (abdominal aortic aneurysm) without rupture ([place]) 09/30/2014  . Left-sided low back pain without sciatica 07/21/2014  . Cardiomyopathy, ischemic 05/23/2014  . DM (diabetes mellitus), type 2 with renal complications ([place]) 98/92/1194  . CORONARY ATHEROSCLEROSIS NATIVE CORONARY ARTERY 11/09/2010  . Depression, major, single episode, complete remission ([place]) 03/18/2010  . Hypothyroidism 02/03/2009  . Hyperlipidemia associated with type 2 diabetes mellitus ([place]) 06/14/2007  . Hypertension associated with diabetes ([place]) 06/14/2007  . GERD 06/14/2007    Current Outpatient Medications:  .  acetaminophen (TYLENOL) 500 MG tablet, Take 1,000 mg by mouth every 8 (eight) hours as needed for moderate pain. , Disp: , Rfl:  .  amiodarone (PACERONE) 100 MG tablet, Take 1 tablet (100 mg total) by mouth daily., Disp: 30 tablet, Rfl:  3 .  atorvastatin (LIPITOR) 80 MG tablet, Take 1 tablet (80 mg total) by mouth daily., Disp: 30 tablet, Rfl: 3 .  carvedilol (COREG) 12.5 MG tablet, TAKE 1 TABLET BY MOUTH TWICE DAILY., Disp: 180 tablet, Rfl: 0 .  cilostazol (PLETAL) 100 MG tablet, TAKE 1 TABLET TWICE DAILY., Disp: 180 tablet, Rfl: 3 .  ELIQUIS 2.5 MG TABS tablet, TAKE 1 TABLET BY MOUTH TWICE DAILY., Disp: 60 tablet, Rfl: 11 .  ezetimibe (ZETIA) 10 MG tablet, TAKE (1) TABLET DAILY., Disp: 90 tablet, Rfl: 0 .  FLUoxetine (PROZAC) 20 MG capsule, TAKE (1) CAPSULE DAILY., Disp: 90 capsule, Rfl: 0 .  furosemide (LASIX) 80 MG tablet, Take 1.5 tablets (120 mg total) by mouth 2 (two) times daily., Disp: 75 tablet, Rfl: 6 .  glucose blood (ACCU-CHEK AVIVA PLUS) test strip, CHECK BLOOD SUGAR 4 TIMES A DAY., Disp: 100 each, Rfl: 11 .  hydrALAZINE (APRESOLINE) 25 MG tablet, Take 1 tablet (25 mg total) by mouth 3 (three) times daily. D/c order for Losartan., Disp: 90 tablet, Rfl: 3 .  hydrALAZINE (APRESOLINE) 25 MG tablet, TAKE 1 TABLET BY MOUTH 3 TIMES A DAY., Disp: 90 tablet, Rfl: 3 .  Investigational - Study Medication, Take 1 tablet by mouth daily. Study name: [place]  Additional study details: MK-1242 (vericiguat) or Placebo, Disp: 1 each, Rfl: PRN .  levothyroxine (SYNTHROID, LEVOTHROID) 112 MCG tablet, Take 1 tablet (112 mcg total) by mouth daily., Disp: 90 tablet, Rfl: 3 .  temazepam (RESTORIL) 30 MG capsule, TAKE 1 CAPSULE AT BEDTIME AS NEEDED FOR SLEEP., Disp: 30 capsule, Rfl: 0 .  ALPRAZolam (XANAX) 0.5 MG tablet, TAKE 1/2 TABLET  TWICE DAILY AS NEEDED FOR ANXIETY. (Patient not taking: Reported on 05/03/2018), Disp: 60 tablet, Rfl: 5 .  HYDROcodone-acetaminophen (NORCO/VICODIN) 5-325 MG tablet, Take 1 tablet by mouth every 4 (four) hours as needed for moderate pain. (Patient not taking: Reported on 05/03/2018), Disp: 24 tablet, Rfl: 0 .  Lancets MISC, Check blood sugar 4 times a day (Patient not taking: Reported on 05/03/2018), Disp: 100  each, Rfl: 11 .  Multiple Vitamin (MULTIVITAMIN) tablet, Take 1 tablet by mouth daily., Disp: , Rfl:  .  NITROSTAT 0.4 MG SL tablet, DISSOLVE 1 TABLET UNDER TONGUE AS NEEDED FOR CHEST PAIN,MAY REPEAT IN5 MINUTES FOR 2 DOSES. (Patient not taking: Reported on 05/03/2018), Disp: 25 tablet, Rfl: 6 .  Potassium Chloride ER 20 MEQ TBCR, Take 20 mEq by mouth daily., Disp: 30 tablet, Rfl: 3 .  tiotropium (SPIRIVA HANDIHALER) 18 MCG inhalation capsule, Place 1 capsule (18 mcg total) into inhaler and inhale daily., Disp: 30 capsule, Rfl: 12 .  traMADol (ULTRAM) 50 MG tablet, Take 1 tablet (50 mg total) by mouth every 6 (six) hours as needed. (Patient not taking: Reported on 05/03/2018), Disp: 12 tablet, Rfl: 0 Allergies  Allergen Reactions  . Metoprolol Shortness Of Breath, Palpitations and Other (See Comments)    Heart starts racing. Shallow breathing   . Metformin And Related Nausea And Vomiting      Social History   Socioeconomic History  . Marital status: Married    Spouse name: Not on file  . Number of children: Not on file  . Years of education: Not on file  . Highest education level: Not on file  Occupational History  . 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 Financial resource strain: Not on file  . Food insecurity:    Worry: Not on file    Inability: Not on file  . Transportation needs:    Medical: Not on file    Non-medical: Not on file  Tobacco Use  . Smoking status: Former Smoker    Packs/day: 1.50    Years: 35.00    Pack years: 52.50    Types: Cigarettes  . Smokeless tobacco: Never Used  . Tobacco comment: quit atleast 25 yrs ago, per pt  Substance and Sexual Activity  . Alcohol use: Yes    Alcohol/week: 1.8 oz    Types: 3 Shots of liquor per week    Comment: socially  . Drug use: No  . Sexual activity: Yes  Lifestyle  . Physical activity:    Days per week: Not on file    Minutes per session: Not on file  . Stress: Not on file  Relationships  . Social connections:     Talks on phone: Not on file    Gets together: Not on file    Attends religious service: Not on file    Active member of club or organization: Not on file    Attends meetings of clubs or organizations: Not on file    Relationship status: Not on file  . Intimate partner violence:    Fear of current or ex partner: Not on file    Emotionally abused: Not on file    Physically abused: Not on file    Forced sexual activity: Not on file  Other Topics Concern  . Not on file  Social History Narrative   Pt lives in [place] with spouse.   Retired from [employer].  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    Physical  Exam  Constitutional: He is oriented to person, place, and time.  Cardiovascular: Normal rate and regular rhythm.  Pulmonary/Chest: Effort normal and breath sounds normal.  Abdominal: Soft.  Musculoskeletal: Normal range of motion. He exhibits no edema.  Neurological: He is alert and oriented to person, place, and time.  Skin: Skin is warm and dry.  Psychiatric: He has a normal mood and affect.        Future Appointments  Date Time Provider [place]  06/11/2018  2:20 PM Larey Dresser, MD [facility] None  06/13/2018  3:00 PM [place] 2 [facility]-CV[facility] None  07/06/2018  2:00 PM MC-CV HS VASC 4 - SS [facility] VVS  07/06/2018  2:45 PM Nickel, Sharmon Leyden, NP [facility]  07/10/2018  3:15 PM Gardiner Barefoot, DPM [facility]    BP 124/70 (BP Location: Left Arm, Patient Position: Sitting, Cuff Size: Large)   Pulse 76   Resp 16   Wt 187 lb (84.8 kg)   SpO2 90%   BMI 29.29 kg/m   Weight yesterday- 187 lb Last visit weight- 187 lb  Andrew [name] was seen at home and reported feeling generally well, denying headache, dizziness SOB or orthopnea. He stated he has been compliant with his medications however he is not weighing daily. He says he feels comfortable just weighing himself twice a week and  knows if he begins to gain weight. I explained the importance of daily weights but he does not seem motivated to change his routine. His medications were verified and his pillbox was refilled.   [name], EMT 06/07/18  ACTION: Home visit completed Next visit planned for 1 week

## 2018-06-07 NOTE — Telephone Encounter (Signed)
Thedore Mins called to request refill and medication reconciliation

## 2018-06-08 ENCOUNTER — Other Ambulatory Visit: Payer: Self-pay

## 2018-06-08 MED ORDER — LEVOTHYROXINE SODIUM 125 MCG PO TABS: 125.0000 ug | ORAL_TABLET | Freq: Every day | ORAL | 1 refills | Status: DC

## 2018-06-08 NOTE — Telephone Encounter (Signed)
Please advise 

## 2018-06-08 NOTE — Telephone Encounter (Signed)
Would ideally like to see him every 3-6 months - looks like his last appt was in February. Would be ok to schedule appt over the next couple of weeks.  Algis Greenhouse. Jerline Pain, MD 06/08/2018 2:47 PM

## 2018-06-11 ENCOUNTER — Other Ambulatory Visit: Payer: Self-pay

## 2018-06-11 ENCOUNTER — Encounter (HOSPITAL_COMMUNITY): Payer: Self-pay | Admitting: Cardiology

## 2018-06-11 ENCOUNTER — Ambulatory Visit (HOSPITAL_COMMUNITY)
Admission: RE | Admit: 2018-06-11 | Discharge: 2018-06-11 | Disposition: A | Discharge status: Home / Self Care | Payer: Medicare Other | Source: Ambulatory Visit | Attending: Cardiology | Admitting: Cardiology

## 2018-06-11 VITALS — BP 116/50 | HR 73 | Wt 188.5 lb

## 2018-06-11 DIAGNOSIS — Z9981 Dependence on supplemental oxygen: Secondary | ICD-10-CM | POA: Diagnosis not present

## 2018-06-11 DIAGNOSIS — I480 Paroxysmal atrial fibrillation: Secondary | ICD-10-CM | POA: Diagnosis not present

## 2018-06-11 DIAGNOSIS — K219 Gastro-esophageal reflux disease without esophagitis: Secondary | ICD-10-CM | POA: Insufficient documentation

## 2018-06-11 DIAGNOSIS — I4892 Unspecified atrial flutter: Secondary | ICD-10-CM | POA: Diagnosis not present

## 2018-06-11 DIAGNOSIS — I255 Ischemic cardiomyopathy: Secondary | ICD-10-CM | POA: Insufficient documentation

## 2018-06-11 DIAGNOSIS — I2582 Chronic total occlusion of coronary artery: Secondary | ICD-10-CM | POA: Insufficient documentation

## 2018-06-11 DIAGNOSIS — Z8673 Personal history of transient ischemic attack (TIA), and cerebral infarction without residual deficits: Secondary | ICD-10-CM | POA: Diagnosis not present

## 2018-06-11 DIAGNOSIS — E039 Hypothyroidism, unspecified: Secondary | ICD-10-CM | POA: Diagnosis not present

## 2018-06-11 DIAGNOSIS — J479 Bronchiectasis, uncomplicated: Secondary | ICD-10-CM | POA: Insufficient documentation

## 2018-06-11 DIAGNOSIS — J449 Chronic obstructive pulmonary disease, unspecified: Secondary | ICD-10-CM | POA: Diagnosis not present

## 2018-06-11 DIAGNOSIS — I779 Disorder of arteries and arterioles, unspecified: Secondary | ICD-10-CM

## 2018-06-11 DIAGNOSIS — K589 Irritable bowel syndrome without diarrhea: Secondary | ICD-10-CM | POA: Insufficient documentation

## 2018-06-11 DIAGNOSIS — I6529 Occlusion and stenosis of unspecified carotid artery: Secondary | ICD-10-CM | POA: Insufficient documentation

## 2018-06-11 DIAGNOSIS — E1122 Type 2 diabetes mellitus with diabetic chronic kidney disease: Secondary | ICD-10-CM | POA: Insufficient documentation

## 2018-06-11 DIAGNOSIS — I130 Hypertensive heart and chronic kidney disease with heart failure and stage 1 through stage 4 chronic kidney disease, or unspecified chronic kidney disease: Secondary | ICD-10-CM | POA: Insufficient documentation

## 2018-06-11 DIAGNOSIS — I714 Abdominal aortic aneurysm, without rupture: Secondary | ICD-10-CM | POA: Diagnosis not present

## 2018-06-11 DIAGNOSIS — Z951 Presence of aortocoronary bypass graft: Secondary | ICD-10-CM | POA: Insufficient documentation

## 2018-06-11 DIAGNOSIS — I447 Left bundle-branch block, unspecified: Secondary | ICD-10-CM | POA: Insufficient documentation

## 2018-06-11 DIAGNOSIS — I2510 Atherosclerotic heart disease of native coronary artery without angina pectoris: Secondary | ICD-10-CM | POA: Insufficient documentation

## 2018-06-11 DIAGNOSIS — I5022 Chronic systolic (congestive) heart failure: Secondary | ICD-10-CM

## 2018-06-11 DIAGNOSIS — E785 Hyperlipidemia, unspecified: Secondary | ICD-10-CM | POA: Insufficient documentation

## 2018-06-11 DIAGNOSIS — Z79899 Other long term (current) drug therapy: Secondary | ICD-10-CM | POA: Insufficient documentation

## 2018-06-11 DIAGNOSIS — N183 Chronic kidney disease, stage 3 unspecified: Secondary | ICD-10-CM

## 2018-06-11 DIAGNOSIS — I5032 Chronic diastolic (congestive) heart failure: Secondary | ICD-10-CM

## 2018-06-11 DIAGNOSIS — E1151 Type 2 diabetes mellitus with diabetic peripheral angiopathy without gangrene: Secondary | ICD-10-CM | POA: Insufficient documentation

## 2018-06-11 DIAGNOSIS — R911 Solitary pulmonary nodule: Secondary | ICD-10-CM | POA: Diagnosis not present

## 2018-06-11 DIAGNOSIS — Z85820 Personal history of malignant melanoma of skin: Secondary | ICD-10-CM | POA: Insufficient documentation

## 2018-06-11 DIAGNOSIS — Z7901 Long term (current) use of anticoagulants: Secondary | ICD-10-CM | POA: Diagnosis not present

## 2018-06-11 LAB — COMPREHENSIVE METABOLIC PANEL
ALK PHOS: 49 U/L (ref 38–126)
ALT: 35 U/L (ref 17–63)
ANION GAP: 9 (ref 5–15)
AST: 40 U/L (ref 15–41)
Albumin: 3.7 g/dL (ref 3.5–5.0)
BUN: 53 mg/dL — ABNORMAL HIGH (ref 6–20)
CALCIUM: 9 mg/dL (ref 8.9–10.3)
CO2: 31 mmol/L (ref 22–32)
Chloride: 101 mmol/L (ref 101–111)
Creatinine, Ser: 2.09 mg/dL — ABNORMAL HIGH (ref 0.61–1.24)
GFR calc non Af Amer: 27 mL/min — ABNORMAL LOW (ref >60)
GFR, EST AFRICAN AMERICAN: 31 mL/min — AB (ref >60)
Glucose, Bld: 122 mg/dL — ABNORMAL HIGH (ref 65–99)
Potassium: 3.8 mmol/L (ref 3.5–5.1)
SODIUM: 141 mmol/L (ref 135–145)
TOTAL PROTEIN: 6.8 g/dL (ref 6.5–8.1)
Total Bilirubin: 0.8 mg/dL (ref 0.3–1.2)

## 2018-06-11 LAB — CBC
HCT: 36.3 % — ABNORMAL LOW (ref 39.0–52.0)
Hemoglobin: 11.7 g/dL — ABNORMAL LOW (ref 13.0–17.0)
MCH: 31.8 pg (ref 26.0–34.0)
MCHC: 32.2 g/dL (ref 30.0–36.0)
MCV: 98.6 fL (ref 78.0–100.0)
PLATELETS: 133 10*3/uL — AB (ref 150–400)
RBC: 3.68 MIL/uL — AB (ref 4.22–5.81)
RDW: 13.1 % (ref 11.5–15.5)
WBC: 6.9 10*3/uL (ref 4.0–10.5)

## 2018-06-11 NOTE — Telephone Encounter (Signed)
Could we get the patient scheduled for an appointment within the next few weeks?  Thanks!

## 2018-06-11 NOTE — Patient Instructions (Signed)
Labs drawn today (if we do not call you, then your lab work was stable)   Your physician recommends that you schedule a follow-up appointment in: 3 months with Dr. McLean    

## 2018-06-11 NOTE — Telephone Encounter (Signed)
LVM to schedule

## 2018-06-12 NOTE — Progress Notes (Signed)
Patient ID: Andrew [name], male   DOB: 1928-10-02, 84 y.o.   MRN: [id number] PCP: Dr. Jerline Pain Cardiology: Dr. Christella Noa is a 84 y.o. male with history of CKD, CAD s/p CABG, and ischemic cardiomyopathy with primarily diastolic CHF presents for followup of CHF and CAD.  He has a history of PAD followed at [facility].  I took him for [facility] in 11/15. 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  He had had occlusion of a relatively small LAD, there were right to left collaterals and no intervention was done (medical management).   He and his wife went on a cruise along the [employer] in 5/16.  He admits to considerable dietary indiscretion (high sodium diet).  It appears that he developed a hypertensive crisis along with chest pain while on the ship. He went into atrial fibrillation and developed CHF.  He was taken to the hospital in [place], [place].  He was in the ICU for about a week on Bipap intermittently.  Troponin peaked at 0.09 during this admission.  He was diuresed and after about 2 wks left the hospital and was able to fly home.    Cardiolite (6/16) showed EF 42%, prior anterolateral MI, no ischemia. Echo in 10/16 with EF 40-45%.    He was admitted on 07/06/15 for RLQ pain and fever.  He was treated for UTI and had a kidney stone as well.  He developed SOB after IVF were given for AKI.  He was treated for acute COPD exacerbation as well as CHF decompensation.  He was diuresed with IV lasix.  His weight on admission was 179 and got as high as 188 after IVF. Discharge weight was 181.  He was admitted in 10/16 with a cerebellar CVA.  He has some resultant imbalance.   He was again admitted in 11/16 with acute on chronic systolic CHF.  This was a short admission that appeared to be precipitated by a sodium load from eating at a [employer] .   3/18 Echo showed stable EF 40-45%.  He was also having chest pain so I did a Cardiolite in 3/18, no ischemia.   He was admitted in 11/18  for elective hernia repair. Post-op, he developed hypoxemia and had a rehab stay.  He is now home and off oxygen.   Creatinine increased to 2.97 in 1/19.  I cut back on losartan and Lasix.  He developed increased lower extremity edema and we increased Lasix back to 120 qam/80 qpm, later increased to 120 bid.   He has been doing well recently.  Weight is down 9 lbs.  No dyspnea walking on flat ground.  Stable leg fatigue/claudication.  No chest pain.  He has been walking in a pool this summer for exercise. No lightheadedness, no palpitations.   ECG (personally reviewed): NSR, LBBB, 1st degree AVB  Labs (12/14): K 4.8, creatinine 1.5 Labs (5/15): LDL 92 Labs (11/15): K 4.1, creatinine 1.5 Labs (12/15): K 4, creatinine 1.5 Labs (1/16): LFTs normal, LDL 51, HDL 47 Labs (5/16): TnI 0.09 Labs (6/16): K 4.8 => 4.4, creatinine 1.79 => 2.26, HCT 28.7 Labs (7/16) K 4.2, creatinine 1.98, LFTs normal, TSH normal Labs (8/16): HCT 27.1 Labs (9/16): hgb 8.1, K 4.3, creatinine 1.8, LFTs normal, TSH elevated Labs (10/16): LDL 74, HDL 45, TSH 5.19 (increased), free T3 and free T4 normal.  Labs (11/16): K 3.5, creatinine 1.47, AST 84, ALT 89 Labs (12/16): K 4.3, creatinine 1.58,  AST 43, ALT 42 Labs (3/17): K 4.2, creatinine 1.4, hgb 10.7, AST 36, ALT 45 Labs (6/17): K 4, creatinine 1.69, hgb 10.9, proBNP 389, TSH mildly elevated Labs (8/17): Free T4 and T3 normal, TSH mildly elevated, K 4.2, creatinine 1.7, LFTs normal, hgb 10.4 Labs (12/17): TSH elevated but free T3 and T4 normal, K 4.5, creatinine 1.9, LDL 45, HDL 39, LFTs normal, HCT 32.9 Labs (1/18): K 4.4, creatinine 1.93 Labs (3/18): K 4.3, creatinine 1.87, BNP 187, hgb 11.1, LDL 75, LFTs normal Labs (4/18): K 4.1, creatinine 1.7 Labs (9/18): K 4, creatinine 1.8, hgb 11.9 Labs (10/18): free T4 0.4, free T3 1.9, TSH 139  Labs (12/18): K 3.4, creatinine 1.76, hgb 12.2 Labs (1/19): K 4 => 4.9, creatinine 2.7 => 2.97, TSH 179, LFTs normal Labs (2/19):  K 4.6, creatinine 2.19 Labs (4/19): LDL 61, HDL 36, K 3.9, creatinine 1.77, LFTs normal   PMH: 1. CKD stage 3 2. HTN 3. Hyperlipidemia 4. AAA: s/p surgery with left renal artery bypass in 1997 - Abdominal US 10/18 with 3.2 AAA 5. GERD 6. IBS 7. Melanoma s/p reception 8. Diabetes: Diet-controlled 9. Carotid stenosis: Carotid dopplers (4/15) with 40-59% bilateral stenosis. Carotid dopplers (7/16) with < 40% BICA stenosis.  Carotid dopplers (10/16) with 40-59% BICA stenosis.  - Carotids (8/17) with minimal disease.  - Carotids (10/18) with 1-39% BICA stenosis.  - Carotids (1/19): 1-39% BICA stenosis 10. PAD: 9/14 ABIs 0.91 right 1.09 left.  7/16 ABIs 0.86 right, 1.1 left; aortoiliac duplex with < 50% bilateral iliac stenosis.  - Aortoiliac duplex (8/17) with < 50% bilateral iliac stenosis.   - ABIs (10/17): left 0.91, right 0.98, left TBI 0.44 (abnormal).  - ABIs (7/18): right 0.97, right 0.75 - ABIs (1/19): right 1.04, left 1.03 11. CAD: Has super-dominant RCA. s/p CABG 1992.  He had Taxus DES to mid PDA in 5/02. LHC (1/05) with 90% ostial PDA (had DES to this vessel), totally occluded RIMA-PDA, totally occluded PLV, sequential SVG-PLV with 1 branch occluded.  LHC (11/15) with left main off the right cusp, 40-50% ostial left main, total occlusion of the proximal LAD (small vessel) with right to left collaterals, large super-dominant RCA with 50% ISR mid RCA, 50% ostial PDA, patent SVG-PLV, RIMA-PDA known atretic. Lexiscan Cardiolite (6/16) with EF 42%, prior anterolateral MI, no ischemia.  - Lexiscan Cardiolite (3/18): EF 51%, basal to mid anterolateral fixed defect with no ischemia.  12. Ischemic cardiomyopathy: Echo (12/09) with EF 45-50%, basal to mid inferolateral hypokinesis. Echo (6/15) with EF 45-50%, akinesis of the mid to apical inferolateral wall.  Echo (6/16) with EF 50-55%, mild LVH, inferior hypokinesis, mild MR.  Echo (10/16) with EF 40-45%, moderate LVH, mildly decreased RV  systolic function.  - Echo (3/18) with EF 40-45%, mid anterior hypokinesis.  - Echo (2/19) with EF 60-65%, mild LVH, trade II diastolic dysfunction.  13. OA 14. Atrial fibrillation/flutter: Paroxysmal.  Noted during 5/16 hospitalization in [place] and in 6/16.  He developed atrial flutter in 8/16, back in NSR by 9/16.  15. Emphysema: PFTs (8/16) with FVC 88%, FEV1 74%, ratio 81%, TLC 83%, DLCO 31%. - PFTs (12/18): FVC 64%, FEV1 69%, ratio 105%, TLC 82%, DLCO 33% => mild obstruction, severely decreased DLCO (similar to prior).   16. Renal cysts 17. Idiopathic peripheral neuropathy.  18. Anemia.  20. 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  20. Hernia repair (11/18) 21. Hypothyroidism 22. Bronchiectasis: CT chest 1/19 with bronchiectasis.  23. Lung nodule: 1.3 cm RML nodule on  1/19 CT, was noted in 2014 => slow growing. Possibly malignant.  Decision made with pulmonary to watch and avoid biopsy.  24. Suspect chronic aspiration with bronchiectasis.   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  FH: Brother with CAD.   Review of systems complete and found to be negative unless listed in HPI.    Current Outpatient Medications  Medication Sig Dispense Refill  . acetaminophen (TYLENOL) 500 MG tablet Take 1,000 mg by mouth every 8 (eight) hours as needed for moderate pain.     Marland Kitchen ALPRAZolam (XANAX) 0.5 MG tablet TAKE 1/2 TABLET TWICE DAILY AS NEEDED FOR ANXIETY. 60 tablet 5  . amiodarone (PACERONE) 100 MG tablet Take 1 tablet (100 mg total) by mouth daily. 30 tablet 3  . atorvastatin (LIPITOR) 80 MG tablet Take 1 tablet (80 mg total) by mouth daily. 30 tablet 3  . carvedilol (COREG) 12.5 MG tablet TAKE 1 TABLET BY MOUTH TWICE DAILY. 180 tablet 0  . cilostazol (PLETAL) 100 MG tablet TAKE 1 TABLET TWICE DAILY. 180 tablet 3  . ELIQUIS 2.5 MG TABS tablet TAKE 1 TABLET BY MOUTH TWICE DAILY. 60 tablet 11  . ezetimibe (ZETIA) 10 MG tablet TAKE (1) TABLET DAILY.  90 tablet 0  . FLUoxetine (PROZAC) 20 MG capsule TAKE (1) CAPSULE DAILY. 90 capsule 0  . furosemide (LASIX) 80 MG tablet Take 1.5 tablets (120 mg total) by mouth 2 (two) times daily. 75 tablet 6  . glucose blood (ACCU-CHEK AVIVA PLUS) test strip CHECK BLOOD SUGAR 4 TIMES A DAY. 100 each 11  . hydrALAZINE (APRESOLINE) 25 MG tablet Take 1 tablet (25 mg total) by mouth 3 (three) times daily. D/c order for Losartan. 90 tablet 3  . HYDROcodone-acetaminophen (NORCO/VICODIN) 5-325 MG tablet Take 1 tablet by mouth every 4 (four) hours as needed for moderate pain. 24 tablet 0  . Investigational - Study Medication Take 1 tablet by mouth daily. Study name: [place]  Additional study details: MK-1242 (vericiguat) or Placebo 1 each PRN  . Lancets MISC Check blood sugar 4 times a day 100 each 11  . levothyroxine (SYNTHROID, LEVOTHROID) 125 MCG tablet Take 1 tablet (125 mcg total) by mouth daily. 90 tablet 1  . Multiple Vitamin (MULTIVITAMIN) tablet Take 1 tablet by mouth daily.    Marland Kitchen NITROSTAT 0.4 MG SL tablet DISSOLVE 1 TABLET UNDER TONGUE AS NEEDED FOR CHEST PAIN,MAY REPEAT IN5 MINUTES FOR 2 DOSES. 25 tablet 6  . Potassium Chloride ER 20 MEQ TBCR Take 20 mEq by mouth daily. 30 tablet 3  . temazepam (RESTORIL) 30 MG capsule TAKE 1 CAPSULE AT BEDTIME AS NEEDED FOR SLEEP. 30 capsule 0  . tiotropium (SPIRIVA HANDIHALER) 18 MCG inhalation capsule Place 1 capsule (18 mcg total) into inhaler and inhale daily. 30 capsule 12  . traMADol (ULTRAM) 50 MG tablet Take 1 tablet (50 mg total) by mouth every 6 (six) hours as needed. 12 tablet 0   No current facility-administered medications for this encounter.    Vitals:   06/11/18 1439  BP: (!) 116/50  Pulse: 73  SpO2: 92%  Weight: 188 lb 8 oz (85.5 kg)   Wt Readings from Last 3 Encounters:  06/11/18 188 lb 8 oz (85.5 kg)  06/07/18 187 lb (84.8 kg)  05/31/18 187 lb (84.8 kg)    Physical Exam General: NAD Neck: No JVD, no thyromegaly or thyroid nodule.  Lungs:  Clear to auscultation bilaterally with normal respiratory effort. CV: Nondisplaced PMI.  Heart regular S1/S2, no S3/S4, no murmur.  No peripheral edema.  No carotid bruit.  Unable to palpate pedal pulses.  Abdomen: Soft, nontender, no hepatosplenomegaly, no distention.  Skin: Intact without lesions or rashes.  Neurologic: Alert and oriented x 3.  Psych: Normal affect. Extremities: No clubbing or cyanosis.  HEENT: Normal.   Assessment/Plan:  1. CAD: s/p CABG.  Cath in 11/15 showed occluded LAD with left to right collaterals.  The LAD was a relatively small vessel (super-dominant right).  Managed medically. Lexiscan Cardiolite in 6/16 with infarction but no ischemia.  Cardiolite 3/18 with infarction, no ischemia. No chest pain.  - Continue ASA and statin.  2. Ischemic cardiomyopathy with systolic CHF: EF 64-15% on 3/18 echo.  Echo 2/19 with LVEF improved to 60-65%. NYHA II-III chronically. He is not volume overloaded on exam and weight is down.  He is off losartan with elevated creatinine.  - Continue Lasix 120 mg bid.  Will get BMET.  - Continue hydralazine 25 mg TID.  - No Imdur on [place] study drug (vericiguat versus placebo).  - Continue Coreg 12.5 mg bid.    3. Carotid stenosis: Stable. Followed at [facility].  4. PAD: Stable claudication, ABIs with some improvement in 1/19.  No pedal ulcers.   - Follows at [facility]. Plan for medical management.  - Continue cilostazol, unable to come off due to worsening claudication when he stops it.  5. Hyperlipidemia: Good lipids in 4/19, continue statin.   6. CKD III:  BMET today. Off losartan.  7. Atrial fibrillation/flutter:  Paroxysmal.  NSR today.  - Continue amiodarone 100 mg daily. Will need to get regular eye exams with amiodarone use.  High resolution CT in 1/19 did not appear to show evidence for amiodarone lung toxicity. CMET today, TSH followed by PCP (hypothyroidism).  - Continue Eliquis 2.5 bid.  CBC today.  8. Suspected OSA:  Cancelled sleep  study due to mask discomfort.  9. COPD: He is now back off oxygen. He has bronchiectasis thought to be due to chronic aspiration. - Follows with Dr. Lake Bells.  10. CVA: Cerebellar, 10/16.  He has had some resulting imbalance.  - He is no longer driving.  11. Hypertension: Pressure stable on hydralazine 25 mg TID.  12. Hypothyroidism: Likely related to amiodarone. -  Improving under PCP's management.  13. Pulmonary nodule: 1.3 cm RML nodule on 1/19 CT chest.  It was also seen in 2014 => slow growing.  Dr Lake Bells has discussed with wife and they have decided not to go forward with biopsy. They would not be interested in lung cancer treatment.   Followup in 3 months.   Loralie Champagne, MD  06/12/2018

## 2018-06-13 ENCOUNTER — Other Ambulatory Visit: Payer: Self-pay

## 2018-06-14 ENCOUNTER — Other Ambulatory Visit (HOSPITAL_COMMUNITY): Payer: Self-pay

## 2018-06-14 ENCOUNTER — Telehealth (HOSPITAL_COMMUNITY): Payer: Self-pay

## 2018-06-14 MED ORDER — FUROSEMIDE 80 MG PO TABS: ORAL_TABLET | ORAL | 6 refills | Status: DC

## 2018-06-14 NOTE — Progress Notes (Signed)
Paramedicine Encounter    Patient ID: Andrew [name], male    DOB: 12/27/1927, 84 y.o.   MRN: [id number]   Patient Care Team: [name], MD as PCP - General (Family Medicine) Larey Dresser, MD as Consulting Physician (Cardiology)  Patient Active Problem List   Diagnosis Date Noted  . Bilateral recurrent inguinal hernias 11/28/2017  . Calf pain 05/25/2016  . Carotid artery stenosis 01/12/2016  . Chronic systolic CHF (congestive heart failure) ([place]) 12/02/2015  . Cerebellar infarct ([place]) 10/16/2015  . 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 PVD (peripheral vascular disease) ([place])   . Hereditary and idiopathic peripheral neuropathy 10/02/2015  . Peripelvic (lymphatic) cyst   . Pernicious anemia 07/15/2015  . Bilateral renal cysts 07/12/2015  . Lung nodule, 55mm RML CT 07/12/15 07/12/2015  . Constipation 07/12/2015  . Kidney stone on right side 07/12/2015  . Stage 3 chronic kidney disease ([place]) 06/14/2015  . Chronic diastolic CHF (congestive heart failure) ([place]) 05/28/2015  . Paroxysmal atrial fibrillation ([place]) 05/28/2015  . AAA (abdominal aortic aneurysm) without rupture ([place]) 09/30/2014  . Left-sided low back pain without sciatica 07/21/2014  . Cardiomyopathy, ischemic 05/23/2014  . DM (diabetes mellitus), type 2 with renal complications ([place]) 59/74/1638  . CORONARY ATHEROSCLEROSIS NATIVE CORONARY ARTERY 11/09/2010  . Depression, major, single episode, complete remission ([place]) 03/18/2010  . Hypothyroidism 02/03/2009  . Hyperlipidemia associated with type 2 diabetes mellitus ([place]) 06/14/2007  . Hypertension associated with diabetes ([place]) 06/14/2007  . GERD 06/14/2007    Current Outpatient Medications:  .  acetaminophen (TYLENOL) 500 MG tablet, Take 1,000 mg by mouth every 8 (eight) hours as needed for moderate pain. , Disp: , Rfl:  .  ALPRAZolam (XANAX) 0.5 MG tablet, TAKE 1/2 TABLET TWICE DAILY AS NEEDED FOR ANXIETY., Disp: 60 tablet,  Rfl: 5 .  amiodarone (PACERONE) 100 MG tablet, Take 1 tablet (100 mg total) by mouth daily., Disp: 30 tablet, Rfl: 3 .  atorvastatin (LIPITOR) 80 MG tablet, Take 1 tablet (80 mg total) by mouth daily., Disp: 30 tablet, Rfl: 3 .  carvedilol (COREG) 12.5 MG tablet, TAKE 1 TABLET BY MOUTH TWICE DAILY., Disp: 180 tablet, Rfl: 0 .  cilostazol (PLETAL) 100 MG tablet, TAKE 1 TABLET TWICE DAILY., Disp: 180 tablet, Rfl: 3 .  ELIQUIS 2.5 MG TABS tablet, TAKE 1 TABLET BY MOUTH TWICE DAILY., Disp: 60 tablet, Rfl: 11 .  ezetimibe (ZETIA) 10 MG tablet, TAKE (1) TABLET DAILY., Disp: 90 tablet, Rfl: 0 .  FLUoxetine (PROZAC) 20 MG capsule, TAKE (1) CAPSULE DAILY., Disp: 90 capsule, Rfl: 0 .  furosemide (LASIX) 80 MG tablet, Take 1.5 tablets (120 mg total) by mouth every morning AND 1 tablet (80 mg total) every evening., Disp: 75 tablet, Rfl: 6 .  glucose blood (ACCU-CHEK AVIVA PLUS) test strip, CHECK BLOOD SUGAR 4 TIMES A DAY., Disp: 100 each, Rfl: 11 .  hydrALAZINE (APRESOLINE) 25 MG tablet, Take 1 tablet (25 mg total) by mouth 3 (three) times daily. D/c order for Losartan., Disp: 90 tablet, Rfl: 3 .  HYDROcodone-acetaminophen (NORCO/VICODIN) 5-325 MG tablet, Take 1 tablet by mouth every 4 (four) hours as needed for moderate pain., Disp: 24 tablet, Rfl: 0 .  Lancets MISC, Check blood sugar 4 times a day, Disp: 100 each, Rfl: 11 .  levothyroxine (SYNTHROID, LEVOTHROID) 125 MCG tablet, Take 1 tablet (125 mcg total) by mouth daily., Disp: 90 tablet, Rfl: 1 .  Multiple Vitamin (MULTIVITAMIN) tablet, Take 1 tablet by mouth  daily., Disp: , Rfl:  .  Potassium Chloride ER 20 MEQ TBCR, Take 20 mEq by mouth daily., Disp: 30 tablet, Rfl: 3 .  temazepam (RESTORIL) 30 MG capsule, TAKE 1 CAPSULE AT BEDTIME AS NEEDED FOR SLEEP., Disp: 30 capsule, Rfl: 0 .  tiotropium (SPIRIVA HANDIHALER) 18 MCG inhalation capsule, Place 1 capsule (18 mcg total) into inhaler and inhale daily., Disp: 30 capsule, Rfl: 12 .  traMADol (ULTRAM) 50 MG  tablet, Take 1 tablet (50 mg total) by mouth every 6 (six) hours as needed., Disp: 12 tablet, Rfl: 0 .  Investigational - Study Medication, Take 1 tablet by mouth daily. Study name: [place]  Additional study details: MK-1242 (vericiguat) or Placebo, Disp: 1 each, Rfl: PRN .  NITROSTAT 0.4 MG SL tablet, DISSOLVE 1 TABLET UNDER TONGUE AS NEEDED FOR CHEST PAIN,MAY REPEAT IN5 MINUTES FOR 2 DOSES. (Patient not taking: Reported on 06/14/2018), Disp: 25 tablet, Rfl: 6 Allergies  Allergen Reactions  . Metoprolol Shortness Of Breath, Palpitations and Other (See Comments)    Heart starts racing. Shallow breathing   . Metformin And Related Nausea And Vomiting      Social History   Socioeconomic History  . Marital status: Married    Spouse name: Not on file  . Number of children: Not on file  . Years of education: Not on file  . Highest education level: Not on file  Occupational History  . 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 Financial resource strain: Not on file  . Food insecurity:    Worry: Not on file    Inability: Not on file  . Transportation needs:    Medical: Not on file    Non-medical: Not on file  Tobacco Use  . Smoking status: Former Smoker    Packs/day: 1.50    Years: 35.00    Pack years: 52.50    Types: Cigarettes  . Smokeless tobacco: Never Used  . Tobacco comment: quit atleast 25 yrs ago, per pt  Substance and Sexual Activity  . Alcohol use: Yes    Alcohol/week: 1.8 oz    Types: 3 Shots of liquor per week    Comment: socially  . Drug use: No  . Sexual activity: Yes  Lifestyle  . Physical activity:    Days per week: Not on file    Minutes per session: Not on file  . Stress: Not on file  Relationships  . Social connections:    Talks on phone: Not on file    Gets together: Not on file    Attends religious service: Not on file    Active member of club or organization: Not on file    Attends meetings of clubs or organizations: Not on file    Relationship status:  Not on file  . Intimate partner violence:    Fear of current or ex partner: Not on file    Emotionally abused: Not on file    Physically abused: Not on file    Forced sexual activity: Not on file  Other Topics Concern  . Not on file  Social History Narrative   Pt lives in [place] with spouse.   Retired from [employer].  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    Physical Exam  Constitutional: He is oriented to person, place, and time.  Cardiovascular: Normal rate and regular rhythm.  Pulmonary/Chest: Effort normal and breath sounds normal.  Abdominal: Soft.  Musculoskeletal: Normal range of motion. He exhibits no edema.  Neurological: He is alert and oriented to person, place, and time.  Skin: Skin is warm and dry.  Psychiatric: He has a normal mood and affect.        Future Appointments  Date Time Provider [place]  07/06/2018  2:00 PM [facility] - [facility]  07/06/2018  2:45 PM Nickel, Sharmon Leyden, NP [facility]  07/10/2018  3:15 PM Gardiner Barefoot, DPM [facility]  09/14/2018  2:40 PM Larey Dresser, MD [facility] None    BP 130/64 (BP Location: Left Arm, Patient Position: Sitting, Cuff Size: Large)   Pulse 78   Resp 16   Wt 189 lb (85.7 kg)   SpO2 92%   BMI 29.60 kg/m   Weight yesterday- did not weigh Last visit weight- 187 lb  Mr Lumadue was seen at home today and reported feeling generally well. He has been compliant with his medications as was apparent by his pillbox being empty. He denied SOB, headache, dizziness, or orthopnea. His medications were verified and his pillbox was refilled.   [name], EMT 06/14/18  ACTION: Home visit completed Next visit planned for 1 week

## 2018-06-14 NOTE — Telephone Encounter (Signed)
Notes recorded by Shirley Muscat, RN on 06/14/2018 at 10:33 AM EDT [facility] made aware. Edwyna Ready is going there today. Changes made in [facility] ------  Notes recorded by Shirley Muscat, RN on 06/12/2018 at 11:15 AM EDT Left message to call back   ------  Notes recorded by Larey Dresser, MD on 06/11/2018 at 9:37 PM EDT Hold Lasix for a day, then decrease to 120 qam/80 qpm.

## 2018-06-19 DIAGNOSIS — M79644 Pain in right finger(s): Secondary | ICD-10-CM | POA: Insufficient documentation

## 2018-06-21 ENCOUNTER — Other Ambulatory Visit (HOSPITAL_COMMUNITY): Payer: Self-pay

## 2018-06-21 NOTE — Progress Notes (Signed)
Paramedicine Encounter    Patient ID: Andrew [name], male    DOB: May 16, 1928, 84 y.o.   MRN: [id number]   Patient Care Team: [name], MD as PCP - General (Family Medicine) Larey Dresser, MD as Consulting Physician (Cardiology)  Patient Active Problem List   Diagnosis Date Noted  . Bilateral recurrent inguinal hernias 11/28/2017  . Calf pain 05/25/2016  . Carotid artery stenosis 01/12/2016  . Chronic systolic CHF (congestive heart failure) ([place]) 12/02/2015  . Cerebellar infarct ([place]) 10/16/2015  . 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 ([place]) 10/16/2015  . PVD (peripheral vascular disease) ([place])   . Hereditary and idiopathic peripheral neuropathy 10/02/2015  . Peripelvic (lymphatic) cyst   . Pernicious anemia 07/15/2015  . Bilateral renal cysts 07/12/2015  . Lung nodule, 67mm RML CT 07/12/15 07/12/2015  . Constipation 07/12/2015  . Kidney stone on right side 07/12/2015  . Stage 3 chronic kidney disease ([place]) 06/14/2015  . Chronic diastolic CHF (congestive heart failure) ([place]) 05/28/2015  . Paroxysmal atrial fibrillation ([place]) 05/28/2015  . AAA (abdominal aortic aneurysm) without rupture ([place]) 09/30/2014  . Left-sided low back pain without sciatica 07/21/2014  . Cardiomyopathy, ischemic 05/23/2014  . DM (diabetes mellitus), type 2 with renal complications ([place]) 83/15/1761  . CORONARY ATHEROSCLEROSIS NATIVE CORONARY ARTERY 11/09/2010  . Depression, major, single episode, complete remission ([place]) 03/18/2010  . Hypothyroidism 02/03/2009  . Hyperlipidemia associated with type 2 diabetes mellitus ([place]) 06/14/2007  . Hypertension associated with diabetes ([place]) 06/14/2007  . GERD 06/14/2007    Current Outpatient Medications:  .  acetaminophen (TYLENOL) 500 MG tablet, Take 1,000 mg by mouth every 8 (eight) hours as needed for moderate pain. , Disp: , Rfl:  .  ALPRAZolam (XANAX) 0.5 MG tablet, TAKE 1/2 TABLET TWICE DAILY AS NEEDED FOR ANXIETY., Disp: 60 tablet,  Rfl: 5 .  amiodarone (PACERONE) 100 MG tablet, Take 1 tablet (100 mg total) by mouth daily., Disp: 30 tablet, Rfl: 3 .  atorvastatin (LIPITOR) 80 MG tablet, Take 1 tablet (80 mg total) by mouth daily., Disp: 30 tablet, Rfl: 3 .  carvedilol (COREG) 12.5 MG tablet, TAKE 1 TABLET BY MOUTH TWICE DAILY., Disp: 180 tablet, Rfl: 0 .  cilostazol (PLETAL) 100 MG tablet, TAKE 1 TABLET TWICE DAILY., Disp: 180 tablet, Rfl: 3 .  ELIQUIS 2.5 MG TABS tablet, TAKE 1 TABLET BY MOUTH TWICE DAILY., Disp: 60 tablet, Rfl: 11 .  ezetimibe (ZETIA) 10 MG tablet, TAKE (1) TABLET DAILY., Disp: 90 tablet, Rfl: 0 .  FLUoxetine (PROZAC) 20 MG capsule, TAKE (1) CAPSULE DAILY., Disp: 90 capsule, Rfl: 0 .  furosemide (LASIX) 80 MG tablet, Take 1.5 tablets (120 mg total) by mouth every morning AND 1 tablet (80 mg total) every evening., Disp: 75 tablet, Rfl: 6 .  glucose blood (ACCU-CHEK AVIVA PLUS) test strip, CHECK BLOOD SUGAR 4 TIMES A DAY., Disp: 100 each, Rfl: 11 .  hydrALAZINE (APRESOLINE) 25 MG tablet, Take 1 tablet (25 mg total) by mouth 3 (three) times daily. D/c order for Losartan., Disp: 90 tablet, Rfl: 3 .  HYDROcodone-acetaminophen (NORCO/VICODIN) 5-325 MG tablet, Take 1 tablet by mouth every 4 (four) hours as needed for moderate pain., Disp: 24 tablet, Rfl: 0 .  levothyroxine (SYNTHROID, LEVOTHROID) 125 MCG tablet, Take 1 tablet (125 mcg total) by mouth daily., Disp: 90 tablet, Rfl: 1 .  Multiple Vitamin (MULTIVITAMIN) tablet, Take 1 tablet by mouth daily., Disp: , Rfl:  .  Potassium Chloride ER 20 MEQ TBCR, Take 20 mEq  by mouth daily., Disp: 30 tablet, Rfl: 3 .  temazepam (RESTORIL) 30 MG capsule, TAKE 1 CAPSULE AT BEDTIME AS NEEDED FOR SLEEP., Disp: 30 capsule, Rfl: 0 .  tiotropium (SPIRIVA HANDIHALER) 18 MCG inhalation capsule, Place 1 capsule (18 mcg total) into inhaler and inhale daily., Disp: 30 capsule, Rfl: 12 .  Investigational - Study Medication, Take 1 tablet by mouth daily. Study name: [place]  Additional  study details: MK-1242 (vericiguat) or Placebo (Patient not taking: Reported on 06/21/2018), Disp: 1 each, Rfl: PRN .  Lancets MISC, Check blood sugar 4 times a day, Disp: 100 each, Rfl: 11 .  NITROSTAT 0.4 MG SL tablet, DISSOLVE 1 TABLET UNDER TONGUE AS NEEDED FOR CHEST PAIN,MAY REPEAT IN5 MINUTES FOR 2 DOSES. (Patient not taking: Reported on 06/14/2018), Disp: 25 tablet, Rfl: 6 .  traMADol (ULTRAM) 50 MG tablet, Take 1 tablet (50 mg total) by mouth every 6 (six) hours as needed. (Patient not taking: Reported on 06/21/2018), Disp: 12 tablet, Rfl: 0 Allergies  Allergen Reactions  . Metoprolol Shortness Of Breath, Palpitations and Other (See Comments)    Heart starts racing. Shallow breathing   . Metformin And Related Nausea And Vomiting      Social History   Socioeconomic History  . Marital status: Married    Spouse name: Not on file  . Number of children: Not on file  . Years of education: Not on file  . Highest education level: Not on file  Occupational History  . 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 Financial resource strain: Not on file  . Food insecurity:    Worry: Not on file    Inability: Not on file  . Transportation needs:    Medical: Not on file    Non-medical: Not on file  Tobacco Use  . Smoking status: Former Smoker    Packs/day: 1.50    Years: 35.00    Pack years: 52.50    Types: Cigarettes  . Smokeless tobacco: Never Used  . Tobacco comment: quit atleast 25 yrs ago, per pt  Substance and Sexual Activity  . Alcohol use: Yes    Alcohol/week: 1.8 oz    Types: 3 Shots of liquor per week    Comment: socially  . Drug use: No  . Sexual activity: Yes  Lifestyle  . Physical activity:    Days per week: Not on file    Minutes per session: Not on file  . Stress: Not on file  Relationships  . Social connections:    Talks on phone: Not on file    Gets together: Not on file    Attends religious service: Not on file    Active member of club or organization: Not on  file    Attends meetings of clubs or organizations: Not on file    Relationship status: Not on file  . Intimate partner violence:    Fear of current or ex partner: Not on file    Emotionally abused: Not on file    Physically abused: Not on file    Forced sexual activity: Not on file  Other Topics Concern  . Not on file  Social History Narrative   Pt lives in [place] with spouse.   Retired from [employer].  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    Physical Exam  Constitutional: He is oriented to person, place, and time.  Cardiovascular: Normal rate and regular rhythm.  Pulmonary/Chest: Effort normal and breath sounds normal.  Abdominal: Soft.  Musculoskeletal: Normal range of motion. He exhibits edema.  Neurological: He is alert and oriented to person, place, and time.  Skin: Skin is warm and dry.  Psychiatric: He has a normal mood and affect.        Future Appointments  Date Time Provider [place]  07/06/2018  2:00 PM [facility] 4 - [facility]  07/06/2018  2:45 PM Nickel, Sharmon Leyden, NP [facility]  07/10/2018  3:15 PM Gardiner Barefoot, DPM [facility]  09/14/2018  2:40 PM Larey Dresser, MD [facility] None    BP (!) 120/58 (BP Location: Left Arm, Patient Position: Sitting, Cuff Size: Large)   Pulse 78   Resp 16   Wt 191 lb 4.8 oz (86.8 kg)   SpO2 90%   BMI 29.96 kg/m   Weight yesterday- Can't remember Last visit weight- 189 lb  Mr Calabretta was seen at home today and reported feeling generally well. He denied SOB, headache dizziness or orthopnea. He has been compliant with his medications which were verified, though I did not see his investigational study medication, and his pillbox was refilled. He stated the study was completed and he was no longer to take those medications. Nobody was home to confirm but I will try to validate with his wife later.   [name],  EMT 06/21/18  ACTION: Home visit completed Next visit planned for 1 week

## 2018-06-25 ENCOUNTER — Telehealth (HOSPITAL_COMMUNITY): Payer: Self-pay

## 2018-06-26 NOTE — Telephone Encounter (Signed)
I called Andrew [name] to schedule an appointment. He did not answer so I left a voicemail requesting he call me back.

## 2018-06-27 ENCOUNTER — Other Ambulatory Visit (HOSPITAL_COMMUNITY): Payer: Self-pay

## 2018-06-27 ENCOUNTER — Telehealth (HOSPITAL_COMMUNITY): Payer: Self-pay

## 2018-06-27 NOTE — Progress Notes (Signed)
Paramedicine Encounter    Patient ID: Andrew [name], male    DOB: 12/27/1927, 84 y.o.   MRN: [id number]   Patient Care Team: [name], MD as PCP - General (Family Medicine) Larey Dresser, MD as Consulting Physician (Cardiology)  Patient Active Problem List   Diagnosis Date Noted  . Bilateral recurrent inguinal hernias 11/28/2017  . Calf pain 05/25/2016  . Carotid artery stenosis 01/12/2016  . Chronic systolic CHF (congestive heart failure) ([place]) 12/02/2015  . Cerebellar infarct ([place]) 10/16/2015  . 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 PVD (peripheral vascular disease) ([place])   . Hereditary and idiopathic peripheral neuropathy 10/02/2015  . Peripelvic (lymphatic) cyst   . Pernicious anemia 07/15/2015  . Bilateral renal cysts 07/12/2015  . Lung nodule, 55mm RML CT 07/12/15 07/12/2015  . Constipation 07/12/2015  . Kidney stone on right side 07/12/2015  . Stage 3 chronic kidney disease ([place]) 06/14/2015  . Chronic diastolic CHF (congestive heart failure) ([place]) 05/28/2015  . Paroxysmal atrial fibrillation ([place]) 05/28/2015  . AAA (abdominal aortic aneurysm) without rupture ([place]) 09/30/2014  . Left-sided low back pain without sciatica 07/21/2014  . Cardiomyopathy, ischemic 05/23/2014  . DM (diabetes mellitus), type 2 with renal complications ([place]) 59/74/1638  . CORONARY ATHEROSCLEROSIS NATIVE CORONARY ARTERY 11/09/2010  . Depression, major, single episode, complete remission ([place]) 03/18/2010  . Hypothyroidism 02/03/2009  . Hyperlipidemia associated with type 2 diabetes mellitus ([place]) 06/14/2007  . Hypertension associated with diabetes ([place]) 06/14/2007  . GERD 06/14/2007    Current Outpatient Medications:  .  acetaminophen (TYLENOL) 500 MG tablet, Take 1,000 mg by mouth every 8 (eight) hours as needed for moderate pain. , Disp: , Rfl:  .  ALPRAZolam (XANAX) 0.5 MG tablet, TAKE 1/2 TABLET TWICE DAILY AS NEEDED FOR ANXIETY., Disp: 60 tablet,  Rfl: 5 .  amiodarone (PACERONE) 100 MG tablet, Take 1 tablet (100 mg total) by mouth daily., Disp: 30 tablet, Rfl: 3 .  atorvastatin (LIPITOR) 80 MG tablet, Take 1 tablet (80 mg total) by mouth daily., Disp: 30 tablet, Rfl: 3 .  carvedilol (COREG) 12.5 MG tablet, TAKE 1 TABLET BY MOUTH TWICE DAILY., Disp: 180 tablet, Rfl: 0 .  cilostazol (PLETAL) 100 MG tablet, TAKE 1 TABLET TWICE DAILY., Disp: 180 tablet, Rfl: 3 .  ELIQUIS 2.5 MG TABS tablet, TAKE 1 TABLET BY MOUTH TWICE DAILY., Disp: 60 tablet, Rfl: 11 .  ezetimibe (ZETIA) 10 MG tablet, TAKE (1) TABLET DAILY., Disp: 90 tablet, Rfl: 0 .  FLUoxetine (PROZAC) 20 MG capsule, TAKE (1) CAPSULE DAILY., Disp: 90 capsule, Rfl: 0 .  furosemide (LASIX) 80 MG tablet, Take 1.5 tablets (120 mg total) by mouth every morning AND 1 tablet (80 mg total) every evening., Disp: 75 tablet, Rfl: 6 .  glucose blood (ACCU-CHEK AVIVA PLUS) test strip, CHECK BLOOD SUGAR 4 TIMES A DAY., Disp: 100 each, Rfl: 11 .  hydrALAZINE (APRESOLINE) 25 MG tablet, Take 1 tablet (25 mg total) by mouth 3 (three) times daily. D/c order for Losartan., Disp: 90 tablet, Rfl: 3 .  HYDROcodone-acetaminophen (NORCO/VICODIN) 5-325 MG tablet, Take 1 tablet by mouth every 4 (four) hours as needed for moderate pain., Disp: 24 tablet, Rfl: 0 .  Lancets MISC, Check blood sugar 4 times a day, Disp: 100 each, Rfl: 11 .  levothyroxine (SYNTHROID, LEVOTHROID) 125 MCG tablet, Take 1 tablet (125 mcg total) by mouth daily., Disp: 90 tablet, Rfl: 1 .  Multiple Vitamin (MULTIVITAMIN) tablet, Take 1 tablet by mouth  daily., Disp: , Rfl:  .  Potassium Chloride ER 20 MEQ TBCR, Take 20 mEq by mouth daily., Disp: 30 tablet, Rfl: 3 .  temazepam (RESTORIL) 30 MG capsule, TAKE 1 CAPSULE AT BEDTIME AS NEEDED FOR SLEEP., Disp: 30 capsule, Rfl: 0 .  tiotropium (SPIRIVA HANDIHALER) 18 MCG inhalation capsule, Place 1 capsule (18 mcg total) into inhaler and inhale daily., Disp: 30 capsule, Rfl: 12 .  Investigational - Study  Medication, Take 1 tablet by mouth daily. Study name: [place]  Additional study details: MK-1242 (vericiguat) or Placebo (Patient not taking: Reported on 06/21/2018), Disp: 1 each, Rfl: PRN .  NITROSTAT 0.4 MG SL tablet, DISSOLVE 1 TABLET UNDER TONGUE AS NEEDED FOR CHEST PAIN,MAY REPEAT IN5 MINUTES FOR 2 DOSES. (Patient not taking: Reported on 06/14/2018), Disp: 25 tablet, Rfl: 6 .  traMADol (ULTRAM) 50 MG tablet, Take 1 tablet (50 mg total) by mouth every 6 (six) hours as needed. (Patient not taking: Reported on 06/21/2018), Disp: 12 tablet, Rfl: 0 Allergies  Allergen Reactions  . Metoprolol Shortness Of Breath, Palpitations and Other (See Comments)    Heart starts racing. Shallow breathing   . Metformin And Related Nausea And Vomiting      Social History   Socioeconomic History  . Marital status: Married    Spouse name: Not on file  . Number of children: Not on file  . Years of education: Not on file  . Highest education level: Not on file  Occupational History  . 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 Financial resource strain: Not on file  . Food insecurity:    Worry: Not on file    Inability: Not on file  . Transportation needs:    Medical: Not on file    Non-medical: Not on file  Tobacco Use  . Smoking status: Former Smoker    Packs/day: 1.50    Years: 35.00    Pack years: 52.50    Types: Cigarettes  . Smokeless tobacco: Never Used  . Tobacco comment: quit atleast 25 yrs ago, per pt  Substance and Sexual Activity  . Alcohol use: Yes    Alcohol/week: 1.8 oz    Types: 3 Shots of liquor per week    Comment: socially  . Drug use: No  . Sexual activity: Yes  Lifestyle  . Physical activity:    Days per week: Not on file    Minutes per session: Not on file  . Stress: Not on file  Relationships  . Social connections:    Talks on phone: Not on file    Gets together: Not on file    Attends religious service: Not on file    Active member of club or organization: Not on  file    Attends meetings of clubs or organizations: Not on file    Relationship status: Not on file  . Intimate partner violence:    Fear of current or ex partner: Not on file    Emotionally abused: Not on file    Physically abused: Not on file    Forced sexual activity: Not on file  Other Topics Concern  . Not on file  Social History Narrative   Pt lives in [place] with spouse.   Retired from [employer].  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    Physical Exam  Constitutional: He is oriented to person, place, and time.  Cardiovascular: Normal rate and regular rhythm.  Pulmonary/Chest: Effort normal and breath sounds normal.  Abdominal: Soft.  Musculoskeletal: Normal range of motion. He exhibits edema.  Neurological: He is alert and oriented to person, place, and time.  Skin: Skin is warm and dry.  Psychiatric: He has a normal mood and affect.        Future Appointments  Date Time Provider [place]  07/06/2018  2:00 PM [facility] VASC 4 - SS [facility]  07/06/2018  2:45 PM Nickel, Sharmon Leyden, NP [facility]  07/10/2018  3:15 PM Gardiner Barefoot, DPM [facility]  09/14/2018  2:40 PM Larey Dresser, MD [facility] None    BP (!) 106/50 (BP Location: Left Arm, Patient Position: Sitting, Cuff Size: Large)   Pulse 77   Resp 16   Wt 189 lb (85.7 kg)   SpO2 92%   BMI 29.60 kg/m   Weight yesterday- Can not recall Last visit weight- 191 lb  Andrew [name] was seen at home today and reported feeling generally well. He denied SOB, headache, dizziness or orthopnea. He missed Friday and Saturday evening medications last week but was otherwise compliant. When I asked about this he responded, "Well that happens sometimes." His medications were verified and his pillbox was refilled. He could not recall what his weight was yesterday but today he was down two pounds from last week. No changes were necessary at this time.  [name], EMT 06/27/18  ACTION: Home visit completed Next visit planned for 1 week

## 2018-06-27 NOTE — Telephone Encounter (Signed)
I called Andrew [name] to schedule an appointment. His wife answered and said they would be available this afternoon.

## 2018-06-29 ENCOUNTER — Telehealth: Payer: Self-pay

## 2018-06-29 NOTE — Telephone Encounter (Signed)
Contacted patient for Final Phone visit in the [place] research study.  Patient had no complaints and no ae's or sae's to report.

## 2018-07-05 ENCOUNTER — Other Ambulatory Visit (HOSPITAL_COMMUNITY): Payer: Self-pay

## 2018-07-05 NOTE — Progress Notes (Signed)
Paramedicine Encounter    Patient ID: Andrew [name], male    DOB: 1928/11/12, 84 y.o.   MRN: [id number]   Patient Care Team: [name], MD as PCP - General (Family Medicine) Larey Dresser, MD as Consulting Physician (Cardiology)  Patient Active Problem List   Diagnosis Date Noted  . Bilateral recurrent inguinal hernias 11/28/2017  . Calf pain 05/25/2016  . Carotid artery stenosis 01/12/2016  . Chronic systolic CHF (congestive heart failure) ([place]) 12/02/2015  . Cerebellar infarct ([place]) 10/16/2015  . 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 PVD (peripheral vascular disease) ([place])   . Hereditary and idiopathic peripheral neuropathy 10/02/2015  . Peripelvic (lymphatic) cyst   . Pernicious anemia 07/15/2015  . Bilateral renal cysts 07/12/2015  . Lung nodule, 41mm RML CT 07/12/15 07/12/2015  . Constipation 07/12/2015  . Kidney stone on right side 07/12/2015  . Stage 3 chronic kidney disease ([place]) 06/14/2015  . Chronic diastolic CHF (congestive heart failure) ([place]) 05/28/2015  . Paroxysmal atrial fibrillation ([place]) 05/28/2015  . AAA (abdominal aortic aneurysm) without rupture ([place]) 09/30/2014  . Left-sided low back pain without sciatica 07/21/2014  . Cardiomyopathy, ischemic 05/23/2014  . DM (diabetes mellitus), type 2 with renal complications ([place]) 00/86/7619  . CORONARY ATHEROSCLEROSIS NATIVE CORONARY ARTERY 11/09/2010  . Depression, major, single episode, complete remission ([place]) 03/18/2010  . Hypothyroidism 02/03/2009  . Hyperlipidemia associated with type 2 diabetes mellitus ([place]) 06/14/2007  . Hypertension associated with diabetes ([place]) 06/14/2007  . GERD 06/14/2007    Current Outpatient Medications:  .  acetaminophen (TYLENOL) 500 MG tablet, Take 1,000 mg by mouth every 8 (eight) hours as needed for moderate pain. , Disp: , Rfl:  .  ALPRAZolam (XANAX) 0.5 MG tablet, TAKE 1/2 TABLET TWICE DAILY AS NEEDED FOR ANXIETY., Disp: 60 tablet,  Rfl: 5 .  amiodarone (PACERONE) 100 MG tablet, Take 1 tablet (100 mg total) by mouth daily., Disp: 30 tablet, Rfl: 3 .  atorvastatin (LIPITOR) 80 MG tablet, Take 1 tablet (80 mg total) by mouth daily., Disp: 30 tablet, Rfl: 3 .  carvedilol (COREG) 12.5 MG tablet, TAKE 1 TABLET BY MOUTH TWICE DAILY., Disp: 180 tablet, Rfl: 0 .  cilostazol (PLETAL) 100 MG tablet, TAKE 1 TABLET TWICE DAILY., Disp: 180 tablet, Rfl: 3 .  ELIQUIS 2.5 MG TABS tablet, TAKE 1 TABLET BY MOUTH TWICE DAILY., Disp: 60 tablet, Rfl: 11 .  ezetimibe (ZETIA) 10 MG tablet, TAKE (1) TABLET DAILY., Disp: 90 tablet, Rfl: 0 .  FLUoxetine (PROZAC) 20 MG capsule, TAKE (1) CAPSULE DAILY., Disp: 90 capsule, Rfl: 0 .  furosemide (LASIX) 80 MG tablet, Take 1.5 tablets (120 mg total) by mouth every morning AND 1 tablet (80 mg total) every evening., Disp: 75 tablet, Rfl: 6 .  glucose blood (ACCU-CHEK AVIVA PLUS) test strip, CHECK BLOOD SUGAR 4 TIMES A DAY., Disp: 100 each, Rfl: 11 .  hydrALAZINE (APRESOLINE) 25 MG tablet, Take 1 tablet (25 mg total) by mouth 3 (three) times daily. D/c order for Losartan., Disp: 90 tablet, Rfl: 3 .  Lancets MISC, Check blood sugar 4 times a day, Disp: 100 each, Rfl: 11 .  levothyroxine (SYNTHROID, LEVOTHROID) 125 MCG tablet, Take 1 tablet (125 mcg total) by mouth daily., Disp: 90 tablet, Rfl: 1 .  Multiple Vitamin (MULTIVITAMIN) tablet, Take 1 tablet by mouth daily., Disp: , Rfl:  .  Potassium Chloride ER 20 MEQ TBCR, Take 20 mEq by mouth daily., Disp: 30 tablet, Rfl: 3 .  temazepam (RESTORIL) 30 MG capsule, TAKE 1 CAPSULE AT BEDTIME AS NEEDED FOR SLEEP., Disp: 30 capsule, Rfl: 0 .  tiotropium (SPIRIVA HANDIHALER) 18 MCG inhalation capsule, Place 1 capsule (18 mcg total) into inhaler and inhale daily., Disp: 30 capsule, Rfl: 12 .  HYDROcodone-acetaminophen (NORCO/VICODIN) 5-325 MG tablet, Take 1 tablet by mouth every 4 (four) hours as needed for moderate pain., Disp: 24 tablet, Rfl: 0 .  Investigational - Study  Medication, Take 1 tablet by mouth daily. Study name: [place]  Additional study details: MK-1242 (vericiguat) or Placebo (Patient not taking: Reported on 06/21/2018), Disp: 1 each, Rfl: PRN .  NITROSTAT 0.4 MG SL tablet, DISSOLVE 1 TABLET UNDER TONGUE AS NEEDED FOR CHEST PAIN,MAY REPEAT IN5 MINUTES FOR 2 DOSES. (Patient not taking: Reported on 06/14/2018), Disp: 25 tablet, Rfl: 6 .  traMADol (ULTRAM) 50 MG tablet, Take 1 tablet (50 mg total) by mouth every 6 (six) hours as needed. (Patient not taking: Reported on 06/21/2018), Disp: 12 tablet, Rfl: 0 Allergies  Allergen Reactions  . Metoprolol Shortness Of Breath, Palpitations and Other (See Comments)    Heart starts racing. Shallow breathing   . Metformin And Related Nausea And Vomiting      Social History   Socioeconomic History  . Marital status: Married    Spouse name: Not on file  . Number of children: Not on file  . Years of education: Not on file  . Highest education level: Not on file  Occupational History  . 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 Financial resource strain: Not on file  . Food insecurity:    Worry: Not on file    Inability: Not on file  . Transportation needs:    Medical: Not on file    Non-medical: Not on file  Tobacco Use  . Smoking status: Former Smoker    Packs/day: 1.50    Years: 35.00    Pack years: 52.50    Types: Cigarettes  . Smokeless tobacco: Never Used  . Tobacco comment: quit atleast 25 yrs ago, per pt  Substance and Sexual Activity  . Alcohol use: Yes    Alcohol/week: 1.8 oz    Types: 3 Shots of liquor per week    Comment: socially  . Drug use: No  . Sexual activity: Yes  Lifestyle  . Physical activity:    Days per week: Not on file    Minutes per session: Not on file  . Stress: Not on file  Relationships  . Social connections:    Talks on phone: Not on file    Gets together: Not on file    Attends religious service: Not on file    Active member of club or organization: Not on  file    Attends meetings of clubs or organizations: Not on file    Relationship status: Not on file  . Intimate partner violence:    Fear of current or ex partner: Not on file    Emotionally abused: Not on file    Physically abused: Not on file    Forced sexual activity: Not on file  Other Topics Concern  . Not on file  Social History Narrative   Pt lives in [place] with spouse.   Retired from [employer].  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    Physical Exam  Constitutional: He is oriented to person, place, and time.  Cardiovascular: Normal rate and regular rhythm.  Pulmonary/Chest: Effort normal and breath sounds normal.  Abdominal: Soft.  Musculoskeletal: Normal range of motion. He exhibits edema.  Neurological: He is alert and oriented to person, place, and time.  Skin: Skin is warm and dry.  Psychiatric: He has a normal mood and affect.        Future Appointments  Date Time Provider [place]  07/06/2018  2:00 PM [facility] VASC 4 - [facility]  07/06/2018  2:45 PM Nickel, Sharmon Leyden, NP [facility]  07/10/2018  3:15 PM Gardiner Barefoot, DPM [facility]  09/14/2018  2:40 PM Larey Dresser, MD [facility] None    BP (!) 110/58 (BP Location: Left Arm, Patient Position: Sitting, Cuff Size: Large)   Pulse 90   Resp 16   Wt 193 lb 6.4 oz (87.7 kg)   SpO2 90%   BMI 30.29 kg/m   Weight yesterday-Did not weigh Last visit weight- 189 lb  Mr Mccoin was seen at home today and reported feeling well. He denied SOB, headache, dizziness or orthopnea. He had missed one evening dose of medications over the past week despite his wife saying she is constantly reminding him to take them. His wife also expressed concern over the color of his lower legs. They appeared slightly more red than usual however he stated they had been in the garden all afternoon which could have contributed to this. His medications were verified  and his pillbox was refilled.   [name], EMT 07/05/18  ACTION: Home visit completed Next visit planned for 1 week

## 2018-07-06 ENCOUNTER — Ambulatory Visit (HOSPITAL_COMMUNITY)
Admission: RE | Admit: 2018-07-06 | Discharge: 2018-07-06 | Disposition: A | Discharge status: Home / Self Care | Payer: Medicare Other | Source: Ambulatory Visit | Attending: Vascular Surgery | Admitting: Vascular Surgery

## 2018-07-06 ENCOUNTER — Ambulatory Visit: Payer: Medicare Other | Admitting: Family

## 2018-07-06 VITALS — BP 159/76 | HR 63 | Temp 98.5°F | Resp 16 | Ht 67.0 in | Wt 194.0 lb

## 2018-07-06 DIAGNOSIS — I779 Disorder of arteries and arterioles, unspecified: Secondary | ICD-10-CM | POA: Insufficient documentation

## 2018-07-06 DIAGNOSIS — Z87891 Personal history of nicotine dependence: Secondary | ICD-10-CM | POA: Insufficient documentation

## 2018-07-06 DIAGNOSIS — I6523 Occlusion and stenosis of bilateral carotid arteries: Secondary | ICD-10-CM | POA: Diagnosis not present

## 2018-07-06 DIAGNOSIS — Z9889 Other specified postprocedural states: Secondary | ICD-10-CM | POA: Diagnosis not present

## 2018-07-06 DIAGNOSIS — N184 Chronic kidney disease, stage 4 (severe): Secondary | ICD-10-CM | POA: Diagnosis not present

## 2018-07-06 DIAGNOSIS — Z8679 Personal history of other diseases of the circulatory system: Secondary | ICD-10-CM

## 2018-07-06 NOTE — Progress Notes (Signed)
[facility] [place] HISTORY AND PHYSICAL   CC: Follow up peripheral artery occlusive disease.    History of Present Illness:   Andrew [name] is a 84 y.o. male who is status post open AAA repair and left renal artery bypass graft in 1997 by Dr. Amedeo Plenty.  He also has PAD and carotid artery stenosis. Dr. Kellie Simmering monitored pt after Dr. Amedeo Plenty departure. He returns for follow up.  He feels that claudication sx's are improving, his legs feel better with walking.   He denies any known lumbar spine problems.  He does have known arthritis. He denies rest pain, denies non-healing wounds or ulcers.   He hadan exacerbation ofCHF in June 2016 while in [place], then had severe UTI early July 2016; since then he has had dehydration, anemia, weakness.  He feels well now, and better than her has felt in a long time.   He continues to take cilostizol to help with claudication.   He denies anyhistory of TIA or stroke symptoms, specifically he denies a history ofamaurosis fugax or monocular blindness, unilateralfacial drooping, hemiplegia, orreceptive or expressive aphasia.   He denies any new back or abdominal pain.  He remains physically active. 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   He sees a podiatrist on a regular basis.   Pt Diabetic: No, is now diet controlled, last A1C result on file was 6.6 on 11-27-17. His serum creatinine on 06-11-18 was 2.09, eGFR was 27(stage 4 CKD). Pt smoker: former smoker, quit at age 25  Pt meds include:  Statin :Yes Betablocker: No ASA: no Other anticoagulants/antiplatelets: Eliquis started early June 2016, prescribed by his cardiologist (per pt). It appears that he had transient atrial fib.   Current Outpatient Medications  Medication Sig Dispense Refill  . acetaminophen (TYLENOL) 500 MG tablet Take 1,000 mg by mouth every 8 (eight) hours as needed for moderate pain.     Marland Kitchen ALPRAZolam (XANAX) 0.5 MG tablet TAKE 1/2  TABLET TWICE DAILY AS NEEDED FOR ANXIETY. 60 tablet 5  . amiodarone (PACERONE) 100 MG tablet Take 1 tablet (100 mg total) by mouth daily. 30 tablet 3  . atorvastatin (LIPITOR) 80 MG tablet Take 1 tablet (80 mg total) by mouth daily. 30 tablet 3  . carvedilol (COREG) 12.5 MG tablet TAKE 1 TABLET BY MOUTH TWICE DAILY. 180 tablet 0  . cilostazol (PLETAL) 100 MG tablet TAKE 1 TABLET TWICE DAILY. 180 tablet 3  . ELIQUIS 2.5 MG TABS tablet TAKE 1 TABLET BY MOUTH TWICE DAILY. 60 tablet 11  . ezetimibe (ZETIA) 10 MG tablet TAKE (1) TABLET DAILY. 90 tablet 0  . FLUoxetine (PROZAC) 20 MG capsule TAKE (1) CAPSULE DAILY. 90 capsule 0  . furosemide (LASIX) 80 MG tablet Take 1.5 tablets (120 mg total) by mouth every morning AND 1 tablet (80 mg total) every evening. 75 tablet 6  . glucose blood (ACCU-CHEK AVIVA PLUS) test strip CHECK BLOOD SUGAR 4 TIMES A DAY. 100 each 11  . hydrALAZINE (APRESOLINE) 25 MG tablet Take 1 tablet (25 mg total) by mouth 3 (three) times daily. D/c order for Losartan. 90 tablet 3  . HYDROcodone-acetaminophen (NORCO/VICODIN) 5-325 MG tablet Take 1 tablet by mouth every 4 (four) hours as needed for moderate pain. 24 tablet 0  . Investigational - Study Medication Take 1 tablet by mouth daily. Study name: [place]  Additional study details: MK-1242 (vericiguat) or Placebo 1 each PRN  . Lancets MISC Check blood sugar 4 times a day 100 each 11  .  levothyroxine (SYNTHROID, LEVOTHROID) 125 MCG tablet Take 1 tablet (125 mcg total) by mouth daily. 90 tablet 1  . Multiple Vitamin (MULTIVITAMIN) tablet Take 1 tablet by mouth daily.    Marland Kitchen NITROSTAT 0.4 MG SL tablet DISSOLVE 1 TABLET UNDER TONGUE AS NEEDED FOR CHEST PAIN,MAY REPEAT IN5 MINUTES FOR 2 DOSES. 25 tablet 6  . Potassium Chloride ER 20 MEQ TBCR Take 20 mEq by mouth daily. 30 tablet 3  . temazepam (RESTORIL) 30 MG capsule TAKE 1 CAPSULE AT BEDTIME AS NEEDED FOR SLEEP. 30 capsule 0  . tiotropium (SPIRIVA HANDIHALER) 18 MCG inhalation capsule  Place 1 capsule (18 mcg total) into inhaler and inhale daily. 30 capsule 12  . traMADol (ULTRAM) 50 MG tablet Take 1 tablet (50 mg total) by mouth every 6 (six) hours as needed. 12 tablet 0   No current facility-administered medications for this visit.     Past Medical History:  Diagnosis Date  . AAA (abdominal aortic aneurysm) ([place])   . Anemia   . Arthritis   . CAD (coronary artery disease)   . Cancer ([place])    Melanoma - Back  . Carotid artery stenosis   . CHF (congestive heart failure) ([place])   . Cyst of kidney, acquired   . Depression   . Diabetes mellitus   . DVT (deep venous thrombosis) ([place])   . Dysrhythmia   . Fatty liver 2008  . GERD (gastroesophageal reflux disease)   . Hyperlipidemia   . Hypertension   . IBS (irritable bowel syndrome)   . LVF (left ventricular failure) ([place])   . Paroxysmal atrial fibrillation ([facility])   . Pneumonia Jan. 2014  . Shortness of breath dyspnea   . Thyroid disease   . Typical atrial flutter ([place])   . UTI (urinary tract infection) 05/2015   While in [place]   . Vitamin D deficiency     Social History Social History   Tobacco Use  . Smoking status: Former Smoker    Packs/day: 1.50    Years: 35.00    Pack years: 52.50    Types: Cigarettes  . Smokeless tobacco: Never Used  . Tobacco comment: quit atleast 25 yrs ago, per pt  Substance Use Topics  . Alcohol use: Yes    Alcohol/week: 1.8 oz    Types: 3 Shots of liquor per week    Comment: socially  . Drug use: No    Family History Family History  Problem Relation Age of Onset  . Colon cancer Father   . Coronary artery disease Brother   . Diabetes Brother   . Heart disease Brother   . Hyperlipidemia Brother   . Peripheral vascular disease Brother        Varicose Veins  . Throat cancer Brother        abdominal cancer?   . Diabetes Son   . Hyperlipidemia Son     Surgical History Past Surgical History:  Procedure Laterality Date  . ABDOMINAL AORTIC ANEURYSM REPAIR  2002   . CARDIAC CATHETERIZATION    . CORONARY ANGIOPLASTY    . CORONARY ANGIOPLASTY WITH STENT PLACEMENT  2005  . CORONARY ARTERY BYPASS GRAFT  1992  . EYE SURGERY     [place]  . HERNIA REPAIR    . INGUINAL HERNIA REPAIR Bilateral    w/mesh  . INSERTION OF MESH N/A 11/28/2017   Procedure: INSERTION OF MESH;  Surgeon: Donnie Mesa, MD;  Location: [facility];  Service: General;  Laterality: N/A;  GENERAL AND TAP  BLOCK  . KNEE SURGERY Bilateral   . LAPAROSCOPIC INGUINAL HERNIA WITH UMBILICAL HERNIA Bilateral 11/28/2017   Procedure: LAPAROSCOPIC BILATERAL INGUINAL HERNIA REPAIR WITH MESH, UMBILICAL HERNIA REPAIR WITH MESH;  Surgeon: Donnie Mesa, MD;  Location: [facility];  Service: General;  Laterality: Bilateral;  GENERAL AND TAP BLOCK  . LEFT HEART CATHETERIZATION WITH CORONARY ANGIOGRAM N/A 10/30/2014   Procedure: LEFT HEART CATHETERIZATION WITH CORONARY ANGIOGRAM;  Surgeon: Larey Dresser, MD;  Location: [facility] C[facility];  Service: Cardiovascular;  Laterality: N/A;  . QUADRICEPS TENDON REPAIR Bilateral 08/2003   Archie Endo 05/10/2011  . UMBILICAL HERNIA REPAIR  11/28/2017   w/mesh    Allergies  Allergen Reactions  . Metoprolol Shortness Of Breath, Palpitations and Other (See Comments)    Heart starts racing. Shallow breathing   . Metformin And Related Nausea And Vomiting    Current Outpatient Medications  Medication Sig Dispense Refill  . acetaminophen (TYLENOL) 500 MG tablet Take 1,000 mg by mouth every 8 (eight) hours as needed for moderate pain.     Marland Kitchen ALPRAZolam (XANAX) 0.5 MG tablet TAKE 1/2 TABLET TWICE DAILY AS NEEDED FOR ANXIETY. 60 tablet 5  . amiodarone (PACERONE) 100 MG tablet Take 1 tablet (100 mg total) by mouth daily. 30 tablet 3  . atorvastatin (LIPITOR) 80 MG tablet Take 1 tablet (80 mg total) by mouth daily. 30 tablet 3  . carvedilol (COREG) 12.5 MG tablet TAKE 1 TABLET BY MOUTH TWICE DAILY. 180 tablet 0  . cilostazol (PLETAL) 100 MG tablet TAKE 1 TABLET TWICE DAILY. 180 tablet 3  .  ELIQUIS 2.5 MG TABS tablet TAKE 1 TABLET BY MOUTH TWICE DAILY. 60 tablet 11  . ezetimibe (ZETIA) 10 MG tablet TAKE (1) TABLET DAILY. 90 tablet 0  . FLUoxetine (PROZAC) 20 MG capsule TAKE (1) CAPSULE DAILY. 90 capsule 0  . furosemide (LASIX) 80 MG tablet Take 1.5 tablets (120 mg total) by mouth every morning AND 1 tablet (80 mg total) every evening. 75 tablet 6  . glucose blood (ACCU-CHEK AVIVA PLUS) test strip CHECK BLOOD SUGAR 4 TIMES A DAY. 100 each 11  . hydrALAZINE (APRESOLINE) 25 MG tablet Take 1 tablet (25 mg total) by mouth 3 (three) times daily. D/c order for Losartan. 90 tablet 3  . HYDROcodone-acetaminophen (NORCO/VICODIN) 5-325 MG tablet Take 1 tablet by mouth every 4 (four) hours as needed for moderate pain. 24 tablet 0  . Investigational - Study Medication Take 1 tablet by mouth daily. Study name: [place]  Additional study details: MK-1242 (vericiguat) or Placebo 1 each PRN  . Lancets MISC Check blood sugar 4 times a day 100 each 11  . levothyroxine (SYNTHROID, LEVOTHROID) 125 MCG tablet Take 1 tablet (125 mcg total) by mouth daily. 90 tablet 1  . Multiple Vitamin (MULTIVITAMIN) tablet Take 1 tablet by mouth daily.    Marland Kitchen NITROSTAT 0.4 MG SL tablet DISSOLVE 1 TABLET UNDER TONGUE AS NEEDED FOR CHEST PAIN,MAY REPEAT IN5 MINUTES FOR 2 DOSES. 25 tablet 6  . Potassium Chloride ER 20 MEQ TBCR Take 20 mEq by mouth daily. 30 tablet 3  . temazepam (RESTORIL) 30 MG capsule TAKE 1 CAPSULE AT BEDTIME AS NEEDED FOR SLEEP. 30 capsule 0  . tiotropium (SPIRIVA HANDIHALER) 18 MCG inhalation capsule Place 1 capsule (18 mcg total) into inhaler and inhale daily. 30 capsule 12  . traMADol (ULTRAM) 50 MG tablet Take 1 tablet (50 mg total) by mouth every 6 (six) hours as needed. 12 tablet 0   No current facility-administered medications  for this visit.      REVIEW OF SYSTEMS: See HPI for pertinent positives and negatives.  Physical Examination Vitals:   07/06/18 1504 07/06/18 1509  BP: (!) 154/80 (!)  159/76  Pulse: 63   Resp: 16   Temp: 98.5 F (36.9 C)   TempSrc: Oral   SpO2: 90%   Weight: 194 lb (88 kg)   Height: 5' 7"  (1.702 m)    Body mass index is 30.38 kg/m.  General:  WDWN elderly obese male in NAD Gait: Normal HENT: WNL Eyes: Pupils equal Pulmonary: normal non-labored breathing, fair air movement in all fields, CTAB, no rales, rhonchi, or wheezing Cardiac: RRR, no murmur detected Abdomen: soft, NT, moderate sized asymptomatic reducible ventral hernia with use of abdominal muscles. Old surgical scar from AAA repair.  Skin: no rashes, no ulcers, no cellulitis. Tan, healthy appearing.   VASCULAR EXAM  Carotid Bruits Right Left   Negative Negative      Radial pulses are 2+ palpable bilaterally   Adominal aortic pulse is not palpable                      VASCULAR EXAM: Extremities without ischemic changes, without Gangrene; without open wounds.Both lower legs with mild venous stasis dermatitis changes, with no edema.Great toenails are thick, long toenails.                                                                                                            LE Pulses Right Left       FEMORAL  faintly palpable  2+ palpable        POPLITEAL  not palpable   not palpable       POSTERIOR TIBIAL  not palpable   not palpable        DORSALIS PEDIS      ANTERIOR TIBIAL 2+ palpable  1+ palpable     Musculoskeletal: no muscle wasting or atrophy.  Neurologic:  A&O X 3; appropriate affect, sensation is normal; speech is normal, CN 2-12 intact, pain and light touch intact in extremities, motor exam as listed above. Psychiatric: Normal thought content, mood appropriate to clinical situation.     ASSESSMENT:  Andrew [name] is a 84 y.o. male who is status post open AAA repair and left renal artery bypass graft in 1997. He also has PAD and carotid artery stenosis. His legs feel better with walking, no signs of ischemia in his feet or legs. He feels better in  general.  He has no history of stroke or TIA.  I discussed with Dr. Oneida Alar at a previous visit pt worsening calf claudication, and also his stage 4 CKD. The next diagnostic and possible treatment step would be an arteriogram; the contrast used with this has the potential ofcausing theloss ofhisremaining renal function.   Discussed with Mr. Eissler that we advise conservative measures to address his PAOD: daily seated leg exercises, graduated walking program, and maximal medical management. He is in agreement with this.   He has improved his  ABI's to normal.    DATA  ABI (Date: 07/06/2018):  R:   ABI: 1.13 (was 1.04 on 01-04-18),   PT: bi (was mono)  DP: tri (was bi)  TBI:  1.08 (was 0.80)  L:   ABI: [place] (was 1.03),   PT: bi (was mono)  DP: bi (was bi)  TBI: 0.93 (was 0.65) Improved bilateral ABI and TBI, normal bilaterally; improved quality of bilateral waveforms.     BilateralAortoiliac Duplex(08-25-16): <50% bilateral iliac artery stenosis and a patent left renal artery bypass. No significant change compared to exam of 07/17/15.  Carotid Duplex (01/04/18): 1-39% bilateral ICA stenosis. Bilateral vertebral artery flow is antegrade.  Bilateral subclavian artery waveforms are normal.  No significant change compared to exam on 08-25-16.   PLAN:   Daily seated leg exercises as discussed and demonstrated.  Continue excellent walking program.  Based on today's exam and non-invasive vascular lab results, the patient will follow up in1 yearwith the following tests: ABI's, and carotid duplex in 2 years.  I advised him to notify us if he develops worsening symptoms in his legs or feet    I discussed in depth with the patient the nature of atherosclerosis, and emphasized the importance of maximal medical management including strict control of blood pressure, blood glucose, and lipid levels, obtaining regular exercise, and cessation of smoking.  The patient is  aware that without maximal medical management the underlying atherosclerotic disease process will progress, limiting the benefit of any interventions.  The patient was given information about stroke prevention and what symptoms should prompt the patient to seek immediate medical care.  The patient was given information about PAD including signs, symptoms, treatment, what symptoms should prompt the patient to seek immediate medical care, and risk reduction measures to take.  Thank you for allowing [place] to participate in this patient's care.  Clemon Chambers, RN, MSN, FNP-C [facility] Office: [phone]  Clinic MD: Donzetta Matters on call 07/06/2018 3:19 PM

## 2018-07-06 NOTE — Patient Instructions (Signed)
Peripheral Vascular Disease Peripheral vascular disease (PVD) is a disease of the blood vessels that are not part of your heart and brain. A simple term for PVD is poor circulation. In most cases, PVD narrows the blood vessels that carry blood from your heart to the rest of your body. This can result in a decreased supply of blood to your arms, legs, and internal organs, like your stomach or kidneys. However, it most often affects a person's lower legs and feet. There are two types of PVD.  Organic PVD. This is the more common type. It is caused by damage to the structure of blood vessels.  Functional PVD. This is caused by conditions that make blood vessels contract and tighten (spasm).  Without treatment, PVD tends to get worse over time. PVD can also lead to acute ischemic limb. This is when an arm or limb suddenly has trouble getting enough blood. This is a medical emergency. Follow these instructions at home:  Take medicines only as told by your doctor.  Do not use any tobacco products, including cigarettes, chewing tobacco, or electronic cigarettes. If you need help quitting, ask your doctor.  Lose weight if you are overweight, and maintain a healthy weight as told by your doctor.  Eat a diet that is low in fat and cholesterol. If you need help, ask your doctor.  Exercise regularly. Ask your doctor for some good activities for you.  Take good care of your feet. ? Wear comfortable shoes that fit well. ? Check your feet often for any cuts or sores. Contact a doctor if:  You have cramps in your legs while walking.  You have leg pain when you are at rest.  You have coldness in a leg or foot.  Your skin changes.  You are unable to get or have an erection (erectile dysfunction).  You have cuts or sores on your feet that are not healing. Get help right away if:  Your arm or leg turns cold and blue.  Your arms or legs become red, warm, swollen, painful, or numb.  You have  chest pain or trouble breathing.  You suddenly have weakness in your face, arm, or leg.  You become very confused or you cannot speak.  You suddenly have a very bad headache.  You suddenly cannot see. This information is not intended to replace advice given to you by your health care provider. Make sure you discuss any questions you have with your health care provider. Document Released: 03/08/2010 Document Revised: 05/19/2016 Document Reviewed: 05/22/2014 Elsevier Interactive Patient Education  2017 Elsevier Inc.       Stroke Prevention Some health problems and behaviors may make it more likely for you to have a stroke. Below are ways to lessen your risk of having a stroke.  Be active for at least 30 minutes on most or all days.  Do not smoke. Try not to be around others who smoke.  Do not drink too much alcohol. ? Do not have more than 2 drinks a day if you are a man. ? Do not have more than 1 drink a day if you are a woman and are not pregnant.  Eat healthy foods, such as fruits and vegetables. If you were put on a specific diet, follow the diet as told.  Keep your cholesterol levels under control through diet and medicines. Look for foods that are low in saturated fat, trans fat, cholesterol, and are high in fiber.  If you have diabetes, follow   all diet plans and take your medicine as told.  Ask your doctor if you need treatment to lower your blood pressure. If you have high blood pressure (hypertension), follow all diet plans and take your medicine as told by your doctor.  If you are 18-39 years old, have your blood pressure checked every 3-5 years. If you are age 40 or older, have your blood pressure checked every year.  Keep a healthy weight. Eat foods that are low in calories, salt, saturated fat, trans fat, and cholesterol.  Do not take drugs.  Avoid birth control pills, if this applies. Talk to your doctor about the risks of taking birth control pills.  Talk to  your doctor if you have sleep problems (sleep apnea).  Take all medicine as told by your doctor. ? You may be told to take aspirin or blood thinner medicine. Take this medicine as told by your doctor. ? Understand your medicine instructions.  Make sure any other conditions you have are being taken care of.  Get help right away if:  You suddenly lose feeling (you feel numb) or have weakness in your face, arm, or leg.  Your face or eyelid hangs down to one side.  You suddenly feel confused.  You have trouble talking (aphasia) or understanding what people are saying.  You suddenly have trouble seeing in one or both eyes.  You suddenly have trouble walking.  You are dizzy.  You lose your balance or your movements are clumsy (uncoordinated).  You suddenly have a very bad headache and you do not know the cause.  You have new chest pain.  Your heart feels like it is fluttering or skipping a beat (irregular heartbeat). Do not wait to see if the symptoms above go away. Get help right away. Call your local emergency services (911 in [place] Do not drive yourself to the hospital. This information is not intended to replace advice given to you by your health care provider. Make sure you discuss any questions you have with your health care provider. Document Released: 06/12/2012 Document Revised: 05/19/2016 Document Reviewed: 06/14/2013 Elsevier Interactive Patient Education  2018 Elsevier Inc.  

## 2018-07-08 ENCOUNTER — Encounter: Payer: Self-pay | Admitting: Family

## 2018-07-10 ENCOUNTER — Encounter: Payer: Self-pay | Admitting: Podiatry

## 2018-07-10 ENCOUNTER — Ambulatory Visit: Payer: Medicare Other | Admitting: Podiatry

## 2018-07-10 DIAGNOSIS — E1159 Type 2 diabetes mellitus with other circulatory complications: Secondary | ICD-10-CM

## 2018-07-10 DIAGNOSIS — I739 Peripheral vascular disease, unspecified: Secondary | ICD-10-CM

## 2018-07-10 DIAGNOSIS — M79676 Pain in unspecified toe(s): Secondary | ICD-10-CM | POA: Diagnosis not present

## 2018-07-10 DIAGNOSIS — B351 Tinea unguium: Secondary | ICD-10-CM

## 2018-07-10 NOTE — Progress Notes (Signed)
Patient ID: Andrew [name], male   DOB: [birth year], 84 y.o.   MRN: [id number] Complaint:  Visit Type: Patient returns to my office for continued preventative foot care services. Complaint: Patient states" my nails have grown long and thick and become painful to walk and wear shoes" Patient has been diagnosed with DM with no foot complications. The patient presents for preventative foot care services. No changes to ROS.  Patient is on eliquiss and pletal.  Podiatric Exam: Vascular: dorsalis pedis and posterior tibial pulses are palpable bilateral. Capillary return is immediate. Temperature gradient is WNL. Skin turgor WNL  Sensorium: Normal Semmes Weinstein monofilament test. Normal tactile sensation bilaterally. Nail Exam: Pt has thick disfigured discolored nails with subungual debris noted bilateral entire nail hallux through fifth toenails Ulcer Exam: There is no evidence of ulcer or pre-ulcerative changes or infection. Orthopedic Exam: Muscle tone and strength are WNL. No limitations in general ROM. No crepitus or effusions noted. Foot type and digits show no abnormalities. Bony prominences are unremarkable. 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 Skin: No Porokeratosis. No infection or ulcers  Diagnosis:  Onychomycosis, , Pain in right toe, pain in left toes  Treatment & Plan Procedures and Treatment: Consent by patient was obtained for treatment procedures. The patient understood the discussion of treatment and procedures well. All questions were answered thoroughly reviewed. Debridement of mycotic and hypertrophic toenails, 1 through 5 bilateral and clearing of subungual debris. No ulceration, no infection noted.  Return Visit-Office Procedure: Patient instructed to return to the office for a follow up visit 10 weeks. for continued evaluation and treatment.   Gardiner Barefoot DPM

## 2018-07-12 ENCOUNTER — Other Ambulatory Visit (HOSPITAL_COMMUNITY): Payer: Self-pay

## 2018-07-12 NOTE — Progress Notes (Signed)
Paramedicine Encounter    Patient ID: Andrew [name], male    DOB: [birth year], 84 y.o.   MRN: [id number]   Patient Care Team: [name], MD as PCP - General (Family Medicine) Larey Dresser, MD as Consulting Physician (Cardiology)  Patient Active Problem List   Diagnosis Date Noted  . Bilateral recurrent inguinal hernias 11/28/2017  . Calf pain 05/25/2016  . Carotid artery stenosis 01/12/2016  . Chronic systolic CHF (congestive heart failure) ([place]) 12/02/2015  . Cerebellar infarct ([place]) 10/16/2015  . 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 PVD (peripheral vascular disease) ([place])   . Hereditary and idiopathic peripheral neuropathy 10/02/2015  . Peripelvic (lymphatic) cyst   . Pernicious anemia 07/15/2015  . Bilateral renal cysts 07/12/2015  . Lung nodule, 53mm RML CT 07/12/15 07/12/2015  . Constipation 07/12/2015  . Kidney stone on right side 07/12/2015  . Stage 3 chronic kidney disease ([place]) 06/14/2015  . Chronic diastolic CHF (congestive heart failure) ([place]) 05/28/2015  . Paroxysmal atrial fibrillation ([place]) 05/28/2015  . AAA (abdominal aortic aneurysm) without rupture ([place]) 09/30/2014  . Left-sided low back pain without sciatica 07/21/2014  . Cardiomyopathy, ischemic 05/23/2014  . DM (diabetes mellitus), type 2 with renal complications ([place]) 84/66/5993  . CORONARY ATHEROSCLEROSIS NATIVE CORONARY ARTERY 11/09/2010  . Depression, major, single episode, complete remission ([place]) 03/18/2010  . Hypothyroidism 02/03/2009  . Hyperlipidemia associated with type 2 diabetes mellitus ([place]) 06/14/2007  . Hypertension associated with diabetes ([place]) 06/14/2007  . GERD 06/14/2007    Current Outpatient Medications:  .  acetaminophen (TYLENOL) 500 MG tablet, Take 1,000 mg by mouth every 8 (eight) hours as needed for moderate pain. , Disp: , Rfl:  .  ALPRAZolam (XANAX) 0.5 MG tablet, TAKE 1/2 TABLET TWICE DAILY AS NEEDED FOR ANXIETY., Disp: 60 tablet,  Rfl: 5 .  amiodarone (PACERONE) 100 MG tablet, Take 1 tablet (100 mg total) by mouth daily., Disp: 30 tablet, Rfl: 3 .  atorvastatin (LIPITOR) 80 MG tablet, Take 1 tablet (80 mg total) by mouth daily., Disp: 30 tablet, Rfl: 3 .  carvedilol (COREG) 12.5 MG tablet, TAKE 1 TABLET BY MOUTH TWICE DAILY., Disp: 180 tablet, Rfl: 0 .  cilostazol (PLETAL) 100 MG tablet, TAKE 1 TABLET TWICE DAILY., Disp: 180 tablet, Rfl: 3 .  ELIQUIS 2.5 MG TABS tablet, TAKE 1 TABLET BY MOUTH TWICE DAILY., Disp: 60 tablet, Rfl: 11 .  ezetimibe (ZETIA) 10 MG tablet, TAKE (1) TABLET DAILY., Disp: 90 tablet, Rfl: 0 .  FLUoxetine (PROZAC) 20 MG capsule, TAKE (1) CAPSULE DAILY., Disp: 90 capsule, Rfl: 0 .  furosemide (LASIX) 80 MG tablet, Take 1.5 tablets (120 mg total) by mouth every morning AND 1 tablet (80 mg total) every evening., Disp: 75 tablet, Rfl: 6 .  glucose blood (ACCU-CHEK AVIVA PLUS) test strip, CHECK BLOOD SUGAR 4 TIMES A DAY., Disp: 100 each, Rfl: 11 .  hydrALAZINE (APRESOLINE) 25 MG tablet, Take 1 tablet (25 mg total) by mouth 3 (three) times daily. D/c order for Losartan., Disp: 90 tablet, Rfl: 3 .  HYDROcodone-acetaminophen (NORCO/VICODIN) 5-325 MG tablet, Take 1 tablet by mouth every 4 (four) hours as needed for moderate pain., Disp: 24 tablet, Rfl: 0 .  levothyroxine (SYNTHROID, LEVOTHROID) 125 MCG tablet, Take 1 tablet (125 mcg total) by mouth daily., Disp: 90 tablet, Rfl: 1 .  Multiple Vitamin (MULTIVITAMIN) tablet, Take 1 tablet by mouth daily., Disp: , Rfl:  .  Potassium Chloride ER 20 MEQ TBCR, Take 20 mEq  by mouth daily., Disp: 30 tablet, Rfl: 3 .  temazepam (RESTORIL) 30 MG capsule, TAKE 1 CAPSULE AT BEDTIME AS NEEDED FOR SLEEP., Disp: 30 capsule, Rfl: 0 .  tiotropium (SPIRIVA HANDIHALER) 18 MCG inhalation capsule, Place 1 capsule (18 mcg total) into inhaler and inhale daily., Disp: 30 capsule, Rfl: 12 .  Investigational - Study Medication, Take 1 tablet by mouth daily. Study name: [place]  Additional  study details: MK-1242 (vericiguat) or Placebo (Patient not taking: Reported on 07/12/2018), Disp: 1 each, Rfl: PRN .  Lancets MISC, Check blood sugar 4 times a day, Disp: 100 each, Rfl: 11 .  NITROSTAT 0.4 MG SL tablet, DISSOLVE 1 TABLET UNDER TONGUE AS NEEDED FOR CHEST PAIN,MAY REPEAT IN5 MINUTES FOR 2 DOSES. (Patient not taking: Reported on 07/12/2018), Disp: 25 tablet, Rfl: 6 .  traMADol (ULTRAM) 50 MG tablet, Take 1 tablet (50 mg total) by mouth every 6 (six) hours as needed. (Patient not taking: Reported on 07/12/2018), Disp: 12 tablet, Rfl: 0 Allergies  Allergen Reactions  . Metoprolol Shortness Of Breath, Palpitations and Other (See Comments)    Heart starts racing. Shallow breathing   . Metformin   . Metformin And Related Nausea And Vomiting      Social History   Socioeconomic History  . Marital status: Married    Spouse name: Not on file  . Number of children: Not on file  . Years of education: Not on file  . Highest education level: Not on file  Occupational History  . 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 Financial resource strain: Not on file  . Food insecurity:    Worry: Not on file    Inability: Not on file  . Transportation needs:    Medical: Not on file    Non-medical: Not on file  Tobacco Use  . Smoking status: Former Smoker    Packs/day: 1.50    Years: 35.00    Pack years: 52.50    Types: Cigarettes  . Smokeless tobacco: Never Used  . Tobacco comment: quit atleast 25 yrs ago, per pt  Substance and Sexual Activity  . Alcohol use: Yes    Alcohol/week: 1.8 oz    Types: 3 Shots of liquor per week    Comment: socially  . Drug use: No  . Sexual activity: Yes  Lifestyle  . Physical activity:    Days per week: Not on file    Minutes per session: Not on file  . Stress: Not on file  Relationships  . Social connections:    Talks on phone: Not on file    Gets together: Not on file    Attends religious service: Not on file    Active member of club or  organization: Not on file    Attends meetings of clubs or organizations: Not on file    Relationship status: Not on file  . Intimate partner violence:    Fear of current or ex partner: Not on file    Emotionally abused: Not on file    Physically abused: Not on file    Forced sexual activity: Not on file  Other Topics Concern  . Not on file  Social History Narrative   Pt lives in [place] with spouse.   Retired from [employer].  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    Physical Exam      Future Appointments  Date Time Provider [place]  09/14/2018  2:40 PM Larey Dresser, MD  [facility] None  09/18/2018  3:15 PM Gardiner Barefoot, DPM [facility]    BP (!) 104/56 (BP Location: Left Arm, Patient Position: Sitting, Cuff Size: Large)   Pulse 74   Resp 16   Wt 191 lb 8 oz (86.9 kg)   SpO2 92%   BMI 29.99 kg/m   Weight yesterday-  Last visit weight- 193 lb  Mr Viviano was seen at home today and reported feeling well. He denied SOB, headache, dizziness or orthopnea. His medications were verified and his pillbox was refilled.  [name], EMT 07/12/18  ACTION: Home visit completed Next visit planned for 1 week

## 2018-07-16 ENCOUNTER — Other Ambulatory Visit (HOSPITAL_COMMUNITY): Payer: Self-pay | Admitting: Cardiology

## 2018-07-19 ENCOUNTER — Other Ambulatory Visit (HOSPITAL_COMMUNITY): Payer: Self-pay

## 2018-07-19 NOTE — Progress Notes (Signed)
Paramedicine Encounter    Patient ID: Andrew [name], male    DOB: February 23, 1928, 84 y.o.   MRN: [id number]   Patient Care Team: [name], MD as PCP - General (Family Medicine) Larey Dresser, MD as Consulting Physician (Cardiology)  Patient Active Problem List   Diagnosis Date Noted  . Bilateral recurrent inguinal hernias 11/28/2017  . Calf pain 05/25/2016  . Carotid artery stenosis 01/12/2016  . Chronic systolic CHF (congestive heart failure) ([place]) 12/02/2015  . Cerebellar infarct ([place]) 10/16/2015  . 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 ([place]) 10/16/2015  . PVD (peripheral vascular disease) ([place])   . Hereditary and idiopathic peripheral neuropathy 10/02/2015  . Peripelvic (lymphatic) cyst   . Pernicious anemia 07/15/2015  . Bilateral renal cysts 07/12/2015  . Lung nodule, 55mm RML CT 07/12/15 07/12/2015  . Constipation 07/12/2015  . Kidney stone on right side 07/12/2015  . Stage 3 chronic kidney disease ([place]) 06/14/2015  . Chronic diastolic CHF (congestive heart failure) ([place]) 05/28/2015  . Paroxysmal atrial fibrillation ([place]) 05/28/2015  . AAA (abdominal aortic aneurysm) without rupture ([place]) 09/30/2014  . Left-sided low back pain without sciatica 07/21/2014  . Cardiomyopathy, ischemic 05/23/2014  . DM (diabetes mellitus), type 2 with renal complications ([place]) 73/41/9379  . CORONARY ATHEROSCLEROSIS NATIVE CORONARY ARTERY 11/09/2010  . Depression, major, single episode, complete remission ([place]) 03/18/2010  . Hypothyroidism 02/03/2009  . Hyperlipidemia associated with type 2 diabetes mellitus ([place]) 06/14/2007  . Hypertension associated with diabetes ([place]) 06/14/2007  . GERD 06/14/2007    Current Outpatient Medications:  .  acetaminophen (TYLENOL) 500 MG tablet, Take 1,000 mg by mouth every 8 (eight) hours as needed for moderate pain. , Disp: , Rfl:  .  ALPRAZolam (XANAX) 0.5 MG tablet, TAKE 1/2 TABLET TWICE DAILY AS NEEDED FOR ANXIETY., Disp: 60 tablet,  Rfl: 5 .  amiodarone (PACERONE) 100 MG tablet, Take 1 tablet (100 mg total) by mouth daily., Disp: 30 tablet, Rfl: 3 .  atorvastatin (LIPITOR) 80 MG tablet, Take 1 tablet (80 mg total) by mouth daily., Disp: 30 tablet, Rfl: 3 .  carvedilol (COREG) 12.5 MG tablet, TAKE 1 TABLET BY MOUTH TWICE DAILY., Disp: 180 tablet, Rfl: 0 .  cilostazol (PLETAL) 100 MG tablet, TAKE 1 TABLET BY MOUTH TWICE DAILY., Disp: 180 tablet, Rfl: 3 .  ELIQUIS 2.5 MG TABS tablet, TAKE 1 TABLET BY MOUTH TWICE DAILY., Disp: 60 tablet, Rfl: 11 .  ezetimibe (ZETIA) 10 MG tablet, TAKE (1) TABLET DAILY., Disp: 90 tablet, Rfl: 0 .  FLUoxetine (PROZAC) 20 MG capsule, TAKE (1) CAPSULE DAILY., Disp: 90 capsule, Rfl: 0 .  furosemide (LASIX) 80 MG tablet, Take 1.5 tablets (120 mg total) by mouth every morning AND 1 tablet (80 mg total) every evening., Disp: 75 tablet, Rfl: 6 .  hydrALAZINE (APRESOLINE) 25 MG tablet, Take 1 tablet (25 mg total) by mouth 3 (three) times daily. D/c order for Losartan., Disp: 90 tablet, Rfl: 3 .  HYDROcodone-acetaminophen (NORCO/VICODIN) 5-325 MG tablet, Take 1 tablet by mouth every 4 (four) hours as needed for moderate pain., Disp: 24 tablet, Rfl: 0 .  levothyroxine (SYNTHROID, LEVOTHROID) 125 MCG tablet, Take 1 tablet (125 mcg total) by mouth daily., Disp: 90 tablet, Rfl: 1 .  Multiple Vitamin (MULTIVITAMIN) tablet, Take 1 tablet by mouth daily., Disp: , Rfl:  .  Potassium Chloride ER 20 MEQ TBCR, Take 20 mEq by mouth daily., Disp: 30 tablet, Rfl: 3 .  temazepam (RESTORIL) 30 MG capsule, TAKE 1 CAPSULE AT  BEDTIME AS NEEDED FOR SLEEP., Disp: 30 capsule, Rfl: 0 .  tiotropium (SPIRIVA HANDIHALER) 18 MCG inhalation capsule, Place 1 capsule (18 mcg total) into inhaler and inhale daily., Disp: 30 capsule, Rfl: 12 .  glucose blood (ACCU-CHEK AVIVA PLUS) test strip, CHECK BLOOD SUGAR 4 TIMES A DAY. (Patient not taking: Reported on 07/19/2018), Disp: 100 each, Rfl: 11 .  Investigational - Study Medication, Take 1  tablet by mouth daily. Study name: [place]  Additional study details: MK-1242 (vericiguat) or Placebo (Patient not taking: Reported on 07/12/2018), Disp: 1 each, Rfl: PRN .  Lancets MISC, Check blood sugar 4 times a day, Disp: 100 each, Rfl: 11 .  NITROSTAT 0.4 MG SL tablet, DISSOLVE 1 TABLET UNDER TONGUE AS NEEDED FOR CHEST PAIN,MAY REPEAT IN5 MINUTES FOR 2 DOSES. (Patient not taking: Reported on 07/12/2018), Disp: 25 tablet, Rfl: 6 .  traMADol (ULTRAM) 50 MG tablet, Take 1 tablet (50 mg total) by mouth every 6 (six) hours as needed. (Patient not taking: Reported on 07/12/2018), Disp: 12 tablet, Rfl: 0 Allergies  Allergen Reactions  . Metoprolol Shortness Of Breath, Palpitations and Other (See Comments)    Heart starts racing. Shallow breathing   . Metformin   . Metformin And Related Nausea And Vomiting      Social History   Socioeconomic History  . Marital status: Married    Spouse name: Not on file  . Number of children: Not on file  . Years of education: Not on file  . Highest education level: Not on file  Occupational History  . 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 Financial resource strain: Not on file  . Food insecurity:    Worry: Not on file    Inability: Not on file  . Transportation needs:    Medical: Not on file    Non-medical: Not on file  Tobacco Use  . Smoking status: Former Smoker    Packs/day: 1.50    Years: 35.00    Pack years: 52.50    Types: Cigarettes  . Smokeless tobacco: Never Used  . Tobacco comment: quit atleast 25 yrs ago, per pt  Substance and Sexual Activity  . Alcohol use: Yes    Alcohol/week: 1.8 oz    Types: 3 Shots of liquor per week    Comment: socially  . Drug use: No  . Sexual activity: Yes  Lifestyle  . Physical activity:    Days per week: Not on file    Minutes per session: Not on file  . Stress: Not on file  Relationships  . Social connections:    Talks on phone: Not on file    Gets together: Not on file    Attends religious  service: Not on file    Active member of club or organization: Not on file    Attends meetings of clubs or organizations: Not on file    Relationship status: Not on file  . Intimate partner violence:    Fear of current or ex partner: Not on file    Emotionally abused: Not on file    Physically abused: Not on file    Forced sexual activity: Not on file  Other Topics Concern  . Not on file  Social History Narrative   Pt lives in [place] with spouse.   Retired from [employer].  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    Physical Exam  Constitutional: He is oriented to person, place, and time.  Cardiovascular: Normal rate  and regular rhythm.  Pulmonary/Chest: Effort normal and breath sounds normal.  Musculoskeletal: Normal range of motion. He exhibits edema.  Neurological: He is alert and oriented to person, place, and time.  Skin: Skin is warm and dry.  Psychiatric: He has a normal mood and affect.        Future Appointments  Date Time Provider [place]  09/14/2018  2:40 PM Larey Dresser, MD [facility] None  09/18/2018  3:15 PM Gardiner Barefoot, DPM [facility]    BP 106/60 (BP Location: Left Arm, Patient Position: Sitting, Cuff Size: Large)   Pulse 71   Resp 16   Wt 194 lb (88 kg)   SpO2 93%   BMI 30.38 kg/m   Weight yesterday- 194 lb Last visit weight-  191. 5 lb  Andrew [name] was seen at home today and reported feeling well. He denied SOB, headaches, dizziness or orthopnea. He missed a few doses of medications last week including all of Saturday and a few other doses sporadically over the week. When asked about missing his medications, he said "Sometimes it slips my mind. It happens." I encouraged him to set reminders so he doesn't forget moving forward. His medications were verified and his pillbox was refilled.   [name], EMT 07/19/18  ACTION: Home visit completed Next visit planned for 1  week

## 2018-07-26 ENCOUNTER — Other Ambulatory Visit: Payer: Self-pay | Admitting: Family Medicine

## 2018-07-26 ENCOUNTER — Other Ambulatory Visit (HOSPITAL_COMMUNITY): Payer: Self-pay

## 2018-07-26 ENCOUNTER — Other Ambulatory Visit (HOSPITAL_COMMUNITY): Payer: Self-pay | Admitting: Cardiology

## 2018-07-26 ENCOUNTER — Other Ambulatory Visit: Payer: Self-pay | Admitting: Internal Medicine

## 2018-07-26 NOTE — Progress Notes (Signed)
Paramedicine Encounter    Patient ID: Andrew [name], male    DOB: [birth year], 84 y.o.   MRN: [id number]   Patient Care Team: [name], MD as PCP - General (Family Medicine) Larey Dresser, MD as Consulting Physician (Cardiology)  Patient Active Problem List   Diagnosis Date Noted  . Bilateral recurrent inguinal hernias 11/28/2017  . Calf pain 05/25/2016  . Carotid artery stenosis 01/12/2016  . Chronic systolic CHF (congestive heart failure) ([place]) 12/02/2015  . Cerebellar infarct ([place]) 10/16/2015  . 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 PVD (peripheral vascular disease) ([place])   . Hereditary and idiopathic peripheral neuropathy 10/02/2015  . Peripelvic (lymphatic) cyst   . Pernicious anemia 07/15/2015  . Bilateral renal cysts 07/12/2015  . Lung nodule, 2mm RML CT 07/12/15 07/12/2015  . Constipation 07/12/2015  . Kidney stone on right side 07/12/2015  . Stage 3 chronic kidney disease ([place]) 06/14/2015  . Chronic diastolic CHF (congestive heart failure) ([place]) 05/28/2015  . Paroxysmal atrial fibrillation ([place]) 05/28/2015  . AAA (abdominal aortic aneurysm) without rupture ([place]) 09/30/2014  . Left-sided low back pain without sciatica 07/21/2014  . Cardiomyopathy, ischemic 05/23/2014  . DM (diabetes mellitus), type 2 with renal complications ([place]) 48/54/6270  . CORONARY ATHEROSCLEROSIS NATIVE CORONARY ARTERY 11/09/2010  . Depression, major, single episode, complete remission ([place]) 03/18/2010  . Hypothyroidism 02/03/2009  . Hyperlipidemia associated with type 2 diabetes mellitus ([place]) 06/14/2007  . Hypertension associated with diabetes ([place]) 06/14/2007  . GERD 06/14/2007    Current Outpatient Medications:  .  acetaminophen (TYLENOL) 500 MG tablet, Take 1,000 mg by mouth every 8 (eight) hours as needed for moderate pain. , Disp: , Rfl:  .  ALPRAZolam (XANAX) 0.5 MG tablet, TAKE 1/2 TABLET TWICE DAILY AS NEEDED FOR ANXIETY., Disp: 60 tablet,  Rfl: 5 .  amiodarone (PACERONE) 200 MG tablet, Take 0.5 tablets (100 mg total) by mouth daily., Disp: 45 tablet, Rfl: 3 .  atorvastatin (LIPITOR) 80 MG tablet, Take 1 tablet (80 mg total) by mouth daily., Disp: 30 tablet, Rfl: 3 .  carvedilol (COREG) 12.5 MG tablet, TAKE 1 TABLET BY MOUTH TWICE DAILY., Disp: 180 tablet, Rfl: 3 .  cilostazol (PLETAL) 100 MG tablet, TAKE 1 TABLET BY MOUTH TWICE DAILY., Disp: 180 tablet, Rfl: 3 .  ELIQUIS 2.5 MG TABS tablet, TAKE 1 TABLET BY MOUTH TWICE DAILY., Disp: 60 tablet, Rfl: 11 .  ezetimibe (ZETIA) 10 MG tablet, TAKE (1) TABLET DAILY., Disp: 90 tablet, Rfl: 0 .  FLUoxetine (PROZAC) 20 MG capsule, TAKE (1) CAPSULE DAILY., Disp: 90 capsule, Rfl: 0 .  furosemide (LASIX) 80 MG tablet, Take 1.5 tablets (120 mg total) by mouth every morning AND 1 tablet (80 mg total) every evening., Disp: 75 tablet, Rfl: 6 .  hydrALAZINE (APRESOLINE) 25 MG tablet, Take 1 tablet (25 mg total) by mouth 3 (three) times daily. D/c order for Losartan., Disp: 90 tablet, Rfl: 3 .  HYDROcodone-acetaminophen (NORCO/VICODIN) 5-325 MG tablet, Take 1 tablet by mouth every 4 (four) hours as needed for moderate pain., Disp: 24 tablet, Rfl: 0 .  levothyroxine (SYNTHROID, LEVOTHROID) 125 MCG tablet, Take 1 tablet (125 mcg total) by mouth daily., Disp: 90 tablet, Rfl: 1 .  Multiple Vitamin (MULTIVITAMIN) tablet, Take 1 tablet by mouth daily., Disp: , Rfl:  .  potassium chloride SA (K-DUR,KLOR-CON) 20 MEQ tablet, Take 1 tablet (20 mEq total) by mouth daily., Disp: 90 tablet, Rfl: 3 .  temazepam (RESTORIL) 30 MG capsule,  TAKE 1 CAPSULE AT BEDTIME AS NEEDED FOR SLEEP., Disp: 30 capsule, Rfl: 0 .  tiotropium (SPIRIVA HANDIHALER) 18 MCG inhalation capsule, Place 1 capsule (18 mcg total) into inhaler and inhale daily., Disp: 30 capsule, Rfl: 12 .  amiodarone (PACERONE) 100 MG tablet, Take 1 tablet (100 mg total) by mouth daily., Disp: 30 tablet, Rfl: 3 .  glucose blood (ACCU-CHEK AVIVA PLUS) test strip,  CHECK BLOOD SUGAR 4 TIMES A DAY. (Patient not taking: Reported on 07/19/2018), Disp: 100 each, Rfl: 11 .  Investigational - Study Medication, Take 1 tablet by mouth daily. Study name: [place]  Additional study details: MK-1242 (vericiguat) or Placebo (Patient not taking: Reported on 07/12/2018), Disp: 1 each, Rfl: PRN .  Lancets MISC, Check blood sugar 4 times a day, Disp: 100 each, Rfl: 11 .  NITROSTAT 0.4 MG SL tablet, DISSOLVE 1 TABLET UNDER TONGUE AS NEEDED FOR CHEST PAIN,MAY REPEAT IN5 MINUTES FOR 2 DOSES. (Patient not taking: Reported on 07/12/2018), Disp: 25 tablet, Rfl: 6 .  Potassium Chloride ER 20 MEQ TBCR, Take 20 mEq by mouth daily., Disp: 30 tablet, Rfl: 3 .  traMADol (ULTRAM) 50 MG tablet, Take 1 tablet (50 mg total) by mouth every 6 (six) hours as needed. (Patient not taking: Reported on 07/12/2018), Disp: 12 tablet, Rfl: 0 Allergies  Allergen Reactions  . Metoprolol Shortness Of Breath, Palpitations and Other (See Comments)    Heart starts racing. Shallow breathing   . Metformin   . Metformin And Related Nausea And Vomiting      Social History   Socioeconomic History  . Marital status: Married    Spouse name: Not on file  . Number of children: Not on file  . Years of education: Not on file  . Highest education level: Not on file  Occupational History  . 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 Financial resource strain: Not on file  . Food insecurity:    Worry: Not on file    Inability: Not on file  . Transportation needs:    Medical: Not on file    Non-medical: Not on file  Tobacco Use  . Smoking status: Former Smoker    Packs/day: 1.50    Years: 35.00    Pack years: 52.50    Types: Cigarettes  . Smokeless tobacco: Never Used  . Tobacco comment: quit atleast 25 yrs ago, per pt  Substance and Sexual Activity  . Alcohol use: Yes    Alcohol/week: 1.8 oz    Types: 3 Shots of liquor per week    Comment: socially  . Drug use: No  . Sexual activity: Yes  Lifestyle   . Physical activity:    Days per week: Not on file    Minutes per session: Not on file  . Stress: Not on file  Relationships  . Social connections:    Talks on phone: Not on file    Gets together: Not on file    Attends religious service: Not on file    Active member of club or organization: Not on file    Attends meetings of clubs or organizations: Not on file    Relationship status: Not on file  . Intimate partner violence:    Fear of current or ex partner: Not on file    Emotionally abused: Not on file    Physically abused: Not on file    Forced sexual activity: Not on file  Other Topics Concern  . Not on file  Social History Narrative  Pt lives in [place] with spouse.   Retired from [employer].  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    Physical Exam  Constitutional: He is oriented to person, place, and time.  Cardiovascular: Normal rate and regular rhythm.  Pulmonary/Chest: Effort normal and breath sounds normal.  Abdominal: Soft.  Musculoskeletal: Normal range of motion. He exhibits edema.  Neurological: He is alert and oriented to person, place, and time.  Skin: Skin is warm and dry.  Psychiatric: He has a normal mood and affect.        Future Appointments  Date Time Provider [place]  09/14/2018  2:40 PM Larey Dresser, MD [facility] None  09/18/2018  3:15 PM Gardiner Barefoot, DPM [facility]    BP 130/70 (BP Location: Left Arm, Patient Position: Sitting, Cuff Size: Large)   Pulse 87   Resp 16   Wt 194 lb (88 kg)   SpO2 93%   BMI 30.38 kg/m   Weight yesterday- cant recall  Last visit weight- 194 lb  Andrew [name] was see at home today and rpeorted feeling generally well. He denied SOB, headache, dizziness or orthopnea. He has been compliant with his medications over the past week. His medications were verified and his pillbox was refilled. No further assistance was needed during the time of  my visit.   [name], EMT 07/26/18  ACTION: Home visit completed Next visit planned for 1 week

## 2018-08-02 ENCOUNTER — Other Ambulatory Visit (HOSPITAL_COMMUNITY): Payer: Self-pay

## 2018-08-02 NOTE — Progress Notes (Signed)
Paramedicine Encounter    Patient ID: Andrew [name], male    DOB: 31-May-1928, 84 y.o.   MRN: [id number]   Patient Care Team: [name], MD as PCP - General (Family Medicine) Larey Dresser, MD as Consulting Physician (Cardiology)  Patient Active Problem List   Diagnosis Date Noted  . Bilateral recurrent inguinal hernias 11/28/2017  . Calf pain 05/25/2016  . Carotid artery stenosis 01/12/2016  . Chronic systolic CHF (congestive heart failure) ([place]) 12/02/2015  . Cerebellar infarct ([place]) 10/16/2015  . 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 PVD (peripheral vascular disease) ([place])   . Hereditary and idiopathic peripheral neuropathy 10/02/2015  . Peripelvic (lymphatic) cyst   . Pernicious anemia 07/15/2015  . Bilateral renal cysts 07/12/2015  . Lung nodule, 50mm RML CT 07/12/15 07/12/2015  . Constipation 07/12/2015  . Kidney stone on right side 07/12/2015  . Stage 3 chronic kidney disease ([place]) 06/14/2015  . Chronic diastolic CHF (congestive heart failure) ([place]) 05/28/2015  . Paroxysmal atrial fibrillation ([place]) 05/28/2015  . AAA (abdominal aortic aneurysm) without rupture ([place]) 09/30/2014  . Left-sided low back pain without sciatica 07/21/2014  . Cardiomyopathy, ischemic 05/23/2014  . DM (diabetes mellitus), type 2 with renal complications ([place]) 54/65/6812  . CORONARY ATHEROSCLEROSIS NATIVE CORONARY ARTERY 11/09/2010  . Depression, major, single episode, complete remission ([place]) 03/18/2010  . Hypothyroidism 02/03/2009  . Hyperlipidemia associated with type 2 diabetes mellitus ([place]) 06/14/2007  . Hypertension associated with diabetes ([place]) 06/14/2007  . GERD 06/14/2007    Current Outpatient Medications:  .  acetaminophen (TYLENOL) 500 MG tablet, Take 1,000 mg by mouth every 8 (eight) hours as needed for moderate pain. , Disp: , Rfl:  .  ALPRAZolam (XANAX) 0.5 MG tablet, TAKE 1/2 TABLET TWICE DAILY AS NEEDED FOR ANXIETY., Disp: 60 tablet,  Rfl: 5 .  amiodarone (PACERONE) 200 MG tablet, Take 0.5 tablets (100 mg total) by mouth daily., Disp: 45 tablet, Rfl: 3 .  atorvastatin (LIPITOR) 80 MG tablet, Take 1 tablet (80 mg total) by mouth daily., Disp: 30 tablet, Rfl: 3 .  carvedilol (COREG) 12.5 MG tablet, TAKE 1 TABLET BY MOUTH TWICE DAILY., Disp: 180 tablet, Rfl: 3 .  cilostazol (PLETAL) 100 MG tablet, TAKE 1 TABLET BY MOUTH TWICE DAILY., Disp: 180 tablet, Rfl: 3 .  ELIQUIS 2.5 MG TABS tablet, TAKE 1 TABLET BY MOUTH TWICE DAILY., Disp: 60 tablet, Rfl: 11 .  ezetimibe (ZETIA) 10 MG tablet, TAKE (1) TABLET DAILY., Disp: 90 tablet, Rfl: 0 .  FLUoxetine (PROZAC) 20 MG capsule, TAKE (1) CAPSULE DAILY., Disp: 90 capsule, Rfl: 0 .  furosemide (LASIX) 80 MG tablet, Take 1.5 tablets (120 mg total) by mouth every morning AND 1 tablet (80 mg total) every evening., Disp: 75 tablet, Rfl: 6 .  hydrALAZINE (APRESOLINE) 25 MG tablet, Take 1 tablet (25 mg total) by mouth 3 (three) times daily. D/c order for Losartan., Disp: 90 tablet, Rfl: 3 .  HYDROcodone-acetaminophen (NORCO/VICODIN) 5-325 MG tablet, Take 1 tablet by mouth every 4 (four) hours as needed for moderate pain., Disp: 24 tablet, Rfl: 0 .  Lancets MISC, Check blood sugar 4 times a day, Disp: 100 each, Rfl: 11 .  levothyroxine (SYNTHROID, LEVOTHROID) 125 MCG tablet, Take 1 tablet (125 mcg total) by mouth daily., Disp: 90 tablet, Rfl: 1 .  Multiple Vitamin (MULTIVITAMIN) tablet, Take 1 tablet by mouth daily., Disp: , Rfl:  .  potassium chloride SA (K-DUR,KLOR-CON) 20 MEQ tablet, Take 1 tablet (20 mEq  total) by mouth daily., Disp: 90 tablet, Rfl: 3 .  temazepam (RESTORIL) 30 MG capsule, TAKE 1 CAPSULE AT BEDTIME AS NEEDED FOR SLEEP., Disp: 30 capsule, Rfl: 0 .  tiotropium (SPIRIVA HANDIHALER) 18 MCG inhalation capsule, Place 1 capsule (18 mcg total) into inhaler and inhale daily., Disp: 30 capsule, Rfl: 12 .  amiodarone (PACERONE) 100 MG tablet, Take 1 tablet (100 mg total) by mouth daily.  (Patient not taking: Reported on 08/02/2018), Disp: 30 tablet, Rfl: 3 .  glucose blood (ACCU-CHEK AVIVA PLUS) test strip, CHECK BLOOD SUGAR 4 TIMES A DAY. (Patient not taking: Reported on 07/19/2018), Disp: 100 each, Rfl: 11 .  Investigational - Study Medication, Take 1 tablet by mouth daily. Study name: [place]  Additional study details: MK-1242 (vericiguat) or Placebo (Patient not taking: Reported on 07/12/2018), Disp: 1 each, Rfl: PRN .  NITROSTAT 0.4 MG SL tablet, DISSOLVE 1 TABLET UNDER TONGUE AS NEEDED FOR CHEST PAIN,MAY REPEAT IN5 MINUTES FOR 2 DOSES. (Patient not taking: Reported on 07/12/2018), Disp: 25 tablet, Rfl: 6 .  Potassium Chloride ER 20 MEQ TBCR, Take 20 mEq by mouth daily., Disp: 30 tablet, Rfl: 3 .  traMADol (ULTRAM) 50 MG tablet, Take 1 tablet (50 mg total) by mouth every 6 (six) hours as needed. (Patient not taking: Reported on 07/12/2018), Disp: 12 tablet, Rfl: 0 Allergies  Allergen Reactions  . Metoprolol Shortness Of Breath, Palpitations and Other (See Comments)    Heart starts racing. Shallow breathing   . Metformin   . Metformin And Related Nausea And Vomiting      Social History   Socioeconomic History  . Marital status: Married    Spouse name: Not on file  . Number of children: Not on file  . Years of education: Not on file  . Highest education level: Not on file  Occupational History  . 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 Financial resource strain: Not on file  . Food insecurity:    Worry: Not on file    Inability: Not on file  . Transportation needs:    Medical: Not on file    Non-medical: Not on file  Tobacco Use  . Smoking status: Former Smoker    Packs/day: 1.50    Years: 35.00    Pack years: 52.50    Types: Cigarettes  . Smokeless tobacco: Never Used  . Tobacco comment: quit atleast 25 yrs ago, per pt  Substance and Sexual Activity  . Alcohol use: Yes    Alcohol/week: 3.0 standard drinks    Types: 3 Shots of liquor per week    Comment:  socially  . Drug use: No  . Sexual activity: Yes  Lifestyle  . Physical activity:    Days per week: Not on file    Minutes per session: Not on file  . Stress: Not on file  Relationships  . Social connections:    Talks on phone: Not on file    Gets together: Not on file    Attends religious service: Not on file    Active member of club or organization: Not on file    Attends meetings of clubs or organizations: Not on file    Relationship status: Not on file  . Intimate partner violence:    Fear of current or ex partner: Not on file    Emotionally abused: Not on file    Physically abused: Not on file    Forced sexual activity: Not on file  Other Topics Concern  . Not  on file  Social History Narrative   Pt lives in [place] with spouse.   Retired from [employer].  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    Physical Exam  Constitutional: He is oriented to person, place, and time.  Cardiovascular: Normal rate and regular rhythm.  Pulmonary/Chest: Effort normal and breath sounds normal.  Abdominal: Soft.  Musculoskeletal: Normal range of motion. He exhibits edema.  Neurological: He is alert and oriented to person, place, and time.  Skin: Skin is warm and dry.  Psychiatric: He has a normal mood and affect.        Future Appointments  Date Time Provider [place]  09/14/2018  2:40 PM Larey Dresser, MD [facility] None  09/18/2018  3:15 PM Gardiner Barefoot, DPM [facility]    BP 116/60 (BP Location: Left Arm, Patient Position: Sitting, Cuff Size: Large)   Pulse 78   Resp 16   Wt 196 lb (88.9 kg)   SpO2 93%   BMI 30.70 kg/m   Weight yesterday- 196 lb Last visit weight- 194 lb  Mr Therien was seen at home today and reported feeling generally well. He denied SOB, headache, dizziness or orthopnea. He has been mostly compliant with his medications, though it appeared that he missed a couple of evening doses  last week. This is not uncommon for him and we have addressed it in the past. His medications were verified and his pillbox was refilled.  [name], EMT 08/02/18  ACTION: Home visit completed Next visit planned for 1 week

## 2018-08-03 ENCOUNTER — Other Ambulatory Visit (HOSPITAL_COMMUNITY): Payer: Self-pay | Admitting: Cardiology

## 2018-08-09 ENCOUNTER — Other Ambulatory Visit (HOSPITAL_COMMUNITY): Payer: Self-pay

## 2018-08-09 NOTE — Progress Notes (Signed)
Paramedicine Encounter    Patient ID: Andrew [name], male    DOB: [birth year], 83 y.o.   MRN: [id number]   Patient Care Team: [name], MD as PCP - General (Family Medicine) Larey Dresser, MD as Consulting Physician (Cardiology)  Patient Active Problem List   Diagnosis Date Noted  . Bilateral recurrent inguinal hernias 11/28/2017  . Calf pain 05/25/2016  . Carotid artery stenosis 01/12/2016  . Chronic systolic CHF (congestive heart failure) ([place]) 12/02/2015  . Cerebellar infarct ([place]) 10/16/2015  . 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 PVD (peripheral vascular disease) ([place])   . Hereditary and idiopathic peripheral neuropathy 10/02/2015  . Peripelvic (lymphatic) cyst   . Pernicious anemia 07/15/2015  . Bilateral renal cysts 07/12/2015  . Lung nodule, 75mm RML CT 07/12/15 07/12/2015  . Constipation 07/12/2015  . Kidney stone on right side 07/12/2015  . Stage 3 chronic kidney disease ([place]) 06/14/2015  . Chronic diastolic CHF (congestive heart failure) ([place]) 05/28/2015  . Paroxysmal atrial fibrillation ([place]) 05/28/2015  . AAA (abdominal aortic aneurysm) without rupture ([place]) 09/30/2014  . Left-sided low back pain without sciatica 07/21/2014  . Cardiomyopathy, ischemic 05/23/2014  . DM (diabetes mellitus), type 2 with renal complications ([place]) 49/44/9675  . CORONARY ATHEROSCLEROSIS NATIVE CORONARY ARTERY 11/09/2010  . Depression, major, single episode, complete remission ([place]) 03/18/2010  . Hypothyroidism 02/03/2009  . Hyperlipidemia associated with type 2 diabetes mellitus ([place]) 06/14/2007  . Hypertension associated with diabetes ([place]) 06/14/2007  . GERD 06/14/2007    Current Outpatient Medications:  .  acetaminophen (TYLENOL) 500 MG tablet, Take 1,000 mg by mouth every 8 (eight) hours as needed for moderate pain. , Disp: , Rfl:  .  ALPRAZolam (XANAX) 0.5 MG tablet, TAKE 1/2 TABLET TWICE DAILY AS NEEDED FOR ANXIETY., Disp: 60 tablet,  Rfl: 5 .  amiodarone (PACERONE) 100 MG tablet, Take 1 tablet (100 mg total) by mouth daily., Disp: 30 tablet, Rfl: 3 .  atorvastatin (LIPITOR) 80 MG tablet, Take 1 tablet (80 mg total) by mouth daily. (Patient taking differently: Take 80 mg by mouth every evening. ), Disp: 30 tablet, Rfl: 3 .  carvedilol (COREG) 12.5 MG tablet, TAKE 1 TABLET BY MOUTH TWICE DAILY., Disp: 180 tablet, Rfl: 3 .  cilostazol (PLETAL) 100 MG tablet, TAKE 1 TABLET BY MOUTH TWICE DAILY., Disp: 180 tablet, Rfl: 3 .  ELIQUIS 2.5 MG TABS tablet, TAKE 1 TABLET BY MOUTH TWICE DAILY., Disp: 60 tablet, Rfl: 11 .  ezetimibe (ZETIA) 10 MG tablet, TAKE (1) TABLET DAILY., Disp: 90 tablet, Rfl: 0 .  FLUoxetine (PROZAC) 20 MG capsule, TAKE (1) CAPSULE DAILY., Disp: 90 capsule, Rfl: 0 .  furosemide (LASIX) 80 MG tablet, Take 1.5 tablets (120 mg total) by mouth every morning AND 1 tablet (80 mg total) every evening., Disp: 75 tablet, Rfl: 6 .  hydrALAZINE (APRESOLINE) 25 MG tablet, Take 1 tablet (25 mg total) by mouth 3 (three) times daily. D/c order for Losartan., Disp: 90 tablet, Rfl: 3 .  levothyroxine (SYNTHROID, LEVOTHROID) 125 MCG tablet, Take 1 tablet (125 mcg total) by mouth daily., Disp: 90 tablet, Rfl: 1 .  Potassium Chloride ER 20 MEQ TBCR, Take 20 mEq by mouth daily., Disp: 30 tablet, Rfl: 3 .  potassium chloride SA (K-DUR,KLOR-CON) 20 MEQ tablet, Take 1 tablet (20 mEq total) by mouth daily., Disp: 90 tablet, Rfl: 3 .  tiotropium (SPIRIVA HANDIHALER) 18 MCG inhalation capsule, Place 1 capsule (18 mcg total) into inhaler and inhale  daily., Disp: 30 capsule, Rfl: 12 .  amiodarone (PACERONE) 200 MG tablet, Take 0.5 tablets (100 mg total) by mouth daily. (Patient not taking: Reported on 08/09/2018), Disp: 45 tablet, Rfl: 3 .  glucose blood (ACCU-CHEK AVIVA PLUS) test strip, CHECK BLOOD SUGAR 4 TIMES A DAY. (Patient not taking: Reported on 07/19/2018), Disp: 100 each, Rfl: 11 .  HYDROcodone-acetaminophen (NORCO/VICODIN) 5-325 MG  tablet, Take 1 tablet by mouth every 4 (four) hours as needed for moderate pain. (Patient not taking: Reported on 08/09/2018), Disp: 24 tablet, Rfl: 0 .  Investigational - Study Medication, Take 1 tablet by mouth daily. Study name: [place]  Additional study details: MK-1242 (vericiguat) or Placebo (Patient not taking: Reported on 07/12/2018), Disp: 1 each, Rfl: PRN .  Lancets MISC, Check blood sugar 4 times a day (Patient not taking: Reported on 08/09/2018), Disp: 100 each, Rfl: 11 .  Multiple Vitamin (MULTIVITAMIN) tablet, Take 1 tablet by mouth daily., Disp: , Rfl:  .  NITROSTAT 0.4 MG SL tablet, DISSOLVE 1 TABLET UNDER TONGUE AS NEEDED FOR CHEST PAIN,MAY REPEAT IN5 MINUTES FOR 2 DOSES. (Patient not taking: Reported on 07/12/2018), Disp: 25 tablet, Rfl: 6 .  temazepam (RESTORIL) 30 MG capsule, TAKE 1 CAPSULE AT BEDTIME AS NEEDED FOR SLEEP. (Patient not taking: Reported on 08/09/2018), Disp: 30 capsule, Rfl: 0 .  traMADol (ULTRAM) 50 MG tablet, Take 1 tablet (50 mg total) by mouth every 6 (six) hours as needed. (Patient not taking: Reported on 07/12/2018), Disp: 12 tablet, Rfl: 0 Allergies  Allergen Reactions  . Metoprolol Shortness Of Breath, Palpitations and Other (See Comments)    Heart starts racing. Shallow breathing   . Metformin   . Metformin And Related Nausea And Vomiting      Social History   Socioeconomic History  . Marital status: Married    Spouse name: Not on file  . Number of children: Not on file  . Years of education: Not on file  . Highest education level: Not on file  Occupational History  . 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 Financial resource strain: Not on file  . Food insecurity:    Worry: Not on file    Inability: Not on file  . Transportation needs:    Medical: Not on file    Non-medical: Not on file  Tobacco Use  . Smoking status: Former Smoker    Packs/day: 1.50    Years: 35.00    Pack years: 52.50    Types: Cigarettes  . Smokeless tobacco: Never Used   . Tobacco comment: quit atleast 25 yrs ago, per pt  Substance and Sexual Activity  . Alcohol use: Yes    Alcohol/week: 3.0 standard drinks    Types: 3 Shots of liquor per week    Comment: socially  . Drug use: No  . Sexual activity: Yes  Lifestyle  . Physical activity:    Days per week: Not on file    Minutes per session: Not on file  . Stress: Not on file  Relationships  . Social connections:    Talks on phone: Not on file    Gets together: Not on file    Attends religious service: Not on file    Active member of club or organization: Not on file    Attends meetings of clubs or organizations: Not on file    Relationship status: Not on file  . Intimate partner violence:    Fear of current or ex partner: Not on file  Emotionally abused: Not on file    Physically abused: Not on file    Forced sexual activity: Not on file  Other Topics Concern  . Not on file  Social History Narrative   Pt lives in [place] with spouse.   Retired from [employer].  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    Physical Exam      Future Appointments  Date Time Provider [place]  09/14/2018  2:40 PM Larey Dresser, MD [facility] None  09/18/2018  3:15 PM Gardiner Barefoot, DPM [facility]    BP 140/68   Pulse 78   Resp 15   SpO2 95%   Weight yesterday-191 Last visit weight-196  Pt was in shower when I arrived, wife reports he is doing better but still needs to be diligent about taking meds daily esp the spiriva, so I suggested to place the spiriva on top of the pill box to use as a reminder to take with his pills.  Pt has missed 2 doses of his afternoon meds last week which isnt uncommon for him.  The atorvastatin changed colors-he only had 5 left in the oldest bottle but I placed it in the new bottle but it is the same medication.  meds verified and pill box refilled. Pt denies any sob. No c/p, no dizziness, no complaints  or issues at this time.   Marylouise Stacks, [facility] Paramedic  08/09/18

## 2018-08-16 ENCOUNTER — Other Ambulatory Visit (HOSPITAL_COMMUNITY): Payer: Self-pay

## 2018-08-16 NOTE — Progress Notes (Signed)
Paramedicine Encounter    Patient ID: JAHAAD PENADO, male    DOB: December 22, 1928, 84 y.o.   MRN: [id number]   Patient Care Team: [name], MD as PCP - General (Family Medicine) Larey Dresser, MD as Consulting Physician (Cardiology)  Patient Active Problem List   Diagnosis Date Noted  . Bilateral recurrent inguinal hernias 11/28/2017  . Calf pain 05/25/2016  . Carotid artery stenosis 01/12/2016  . Chronic systolic CHF (congestive heart failure) ([place]) 12/02/2015  . Cerebellar infarct ([place]) 10/16/2015  . 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 PVD (peripheral vascular disease) ([place])   . Hereditary and idiopathic peripheral neuropathy 10/02/2015  . Peripelvic (lymphatic) cyst   . Pernicious anemia 07/15/2015  . Bilateral renal cysts 07/12/2015  . Lung nodule, 43mm RML CT 07/12/15 07/12/2015  . Constipation 07/12/2015  . Kidney stone on right side 07/12/2015  . Stage 3 chronic kidney disease ([place]) 06/14/2015  . Chronic diastolic CHF (congestive heart failure) ([place]) 05/28/2015  . Paroxysmal atrial fibrillation ([place]) 05/28/2015  . AAA (abdominal aortic aneurysm) without rupture ([place]) 09/30/2014  . Left-sided low back pain without sciatica 07/21/2014  . Cardiomyopathy, ischemic 05/23/2014  . DM (diabetes mellitus), type 2 with renal complications ([place]) 80/99/8338  . CORONARY ATHEROSCLEROSIS NATIVE CORONARY ARTERY 11/09/2010  . Depression, major, single episode, complete remission ([place]) 03/18/2010  . Hypothyroidism 02/03/2009  . Hyperlipidemia associated with type 2 diabetes mellitus ([place]) 06/14/2007  . Hypertension associated with diabetes ([place]) 06/14/2007  . GERD 06/14/2007    Current Outpatient Medications:  .  acetaminophen (TYLENOL) 500 MG tablet, Take 1,000 mg by mouth every 8 (eight) hours as needed for moderate pain. , Disp: , Rfl:  .  ALPRAZolam (XANAX) 0.5 MG tablet, TAKE 1/2 TABLET TWICE DAILY AS NEEDED FOR ANXIETY., Disp: 60 tablet,  Rfl: 5 .  amiodarone (PACERONE) 200 MG tablet, Take 0.5 tablets (100 mg total) by mouth daily., Disp: 45 tablet, Rfl: 3 .  atorvastatin (LIPITOR) 80 MG tablet, Take 1 tablet (80 mg total) by mouth daily. (Patient taking differently: Take 80 mg by mouth every evening. ), Disp: 30 tablet, Rfl: 3 .  carvedilol (COREG) 12.5 MG tablet, TAKE 1 TABLET BY MOUTH TWICE DAILY., Disp: 180 tablet, Rfl: 3 .  cilostazol (PLETAL) 100 MG tablet, TAKE 1 TABLET BY MOUTH TWICE DAILY., Disp: 180 tablet, Rfl: 3 .  ELIQUIS 2.5 MG TABS tablet, TAKE 1 TABLET BY MOUTH TWICE DAILY., Disp: 60 tablet, Rfl: 11 .  ezetimibe (ZETIA) 10 MG tablet, TAKE (1) TABLET DAILY., Disp: 90 tablet, Rfl: 0 .  FLUoxetine (PROZAC) 20 MG capsule, TAKE (1) CAPSULE DAILY., Disp: 90 capsule, Rfl: 0 .  furosemide (LASIX) 80 MG tablet, Take 1.5 tablets (120 mg total) by mouth every morning AND 1 tablet (80 mg total) every evening., Disp: 75 tablet, Rfl: 6 .  glucose blood (ACCU-CHEK AVIVA PLUS) test strip, CHECK BLOOD SUGAR 4 TIMES A DAY., Disp: 100 each, Rfl: 11 .  hydrALAZINE (APRESOLINE) 25 MG tablet, Take 1 tablet (25 mg total) by mouth 3 (three) times daily. D/c order for Losartan., Disp: 90 tablet, Rfl: 3 .  HYDROcodone-acetaminophen (NORCO/VICODIN) 5-325 MG tablet, Take 1 tablet by mouth every 4 (four) hours as needed for moderate pain., Disp: 24 tablet, Rfl: 0 .  Lancets MISC, Check blood sugar 4 times a day, Disp: 100 each, Rfl: 11 .  levothyroxine (SYNTHROID, LEVOTHROID) 125 MCG tablet, Take 1 tablet (125 mcg total) by mouth daily., Disp: 90 tablet,  Rfl: 1 .  Multiple Vitamin (MULTIVITAMIN) tablet, Take 1 tablet by mouth daily., Disp: , Rfl:  .  Potassium Chloride ER 20 MEQ TBCR, Take 20 mEq by mouth daily., Disp: 30 tablet, Rfl: 3 .  temazepam (RESTORIL) 30 MG capsule, TAKE 1 CAPSULE AT BEDTIME AS NEEDED FOR SLEEP., Disp: 30 capsule, Rfl: 0 .  tiotropium (SPIRIVA HANDIHALER) 18 MCG inhalation capsule, Place 1 capsule (18 mcg total) into  inhaler and inhale daily., Disp: 30 capsule, Rfl: 12 .  amiodarone (PACERONE) 100 MG tablet, Take 1 tablet (100 mg total) by mouth daily. (Patient not taking: Reported on 08/16/2018), Disp: 30 tablet, Rfl: 3 .  Investigational - Study Medication, Take 1 tablet by mouth daily. Study name: [place]  Additional study details: MK-1242 (vericiguat) or Placebo (Patient not taking: Reported on 07/12/2018), Disp: 1 each, Rfl: PRN .  NITROSTAT 0.4 MG SL tablet, DISSOLVE 1 TABLET UNDER TONGUE AS NEEDED FOR CHEST PAIN,MAY REPEAT IN5 MINUTES FOR 2 DOSES. (Patient not taking: Reported on 07/12/2018), Disp: 25 tablet, Rfl: 6 .  potassium chloride SA (K-DUR,KLOR-CON) 20 MEQ tablet, Take 1 tablet (20 mEq total) by mouth daily., Disp: 90 tablet, Rfl: 3 .  traMADol (ULTRAM) 50 MG tablet, Take 1 tablet (50 mg total) by mouth every 6 (six) hours as needed. (Patient not taking: Reported on 07/12/2018), Disp: 12 tablet, Rfl: 0 Allergies  Allergen Reactions  . Metoprolol Shortness Of Breath, Palpitations and Other (See Comments)    Heart starts racing. Shallow breathing   . Metformin   . Metformin And Related Nausea And Vomiting      Social History   Socioeconomic History  . Marital status: Married    Spouse name: Not on file  . Number of children: Not on file  . Years of education: Not on file  . Highest education level: Not on file  Occupational History  . 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 Financial resource strain: Not on file  . Food insecurity:    Worry: Not on file    Inability: Not on file  . Transportation needs:    Medical: Not on file    Non-medical: Not on file  Tobacco Use  . Smoking status: Former Smoker    Packs/day: 1.50    Years: 35.00    Pack years: 52.50    Types: Cigarettes  . Smokeless tobacco: Never Used  . Tobacco comment: quit atleast 25 yrs ago, per pt  Substance and Sexual Activity  . Alcohol use: Yes    Alcohol/week: 3.0 standard drinks    Types: 3 Shots of liquor per  week    Comment: socially  . Drug use: No  . Sexual activity: Yes  Lifestyle  . Physical activity:    Days per week: Not on file    Minutes per session: Not on file  . Stress: Not on file  Relationships  . Social connections:    Talks on phone: Not on file    Gets together: Not on file    Attends religious service: Not on file    Active member of club or organization: Not on file    Attends meetings of clubs or organizations: Not on file    Relationship status: Not on file  . Intimate partner violence:    Fear of current or ex partner: Not on file    Emotionally abused: Not on file    Physically abused: Not on file    Forced sexual activity: Not on file  Other  Topics Concern  . Not on file  Social History Narrative   Pt lives in [place] with spouse.   Retired from [employer].  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    Physical Exam  Constitutional: He is oriented to person, place, and time.  Cardiovascular: Normal rate and regular rhythm.  Pulmonary/Chest: Effort normal and breath sounds normal.  Abdominal: Soft.  Musculoskeletal: Normal range of motion.  Neurological: He is alert and oriented to person, place, and time.  Skin: Skin is warm and dry.  Psychiatric: He has a normal mood and affect.        Future Appointments  Date Time Provider [place]  09/14/2018  2:40 PM Larey Dresser, MD [facility] None  09/18/2018  3:15 PM Gardiner Barefoot, DPM [facility]    BP (!) 126/58 (BP Location: Left Arm, Patient Position: Sitting, Cuff Size: Large)   Pulse 65   Resp 16   Wt 191 lb (86.6 kg)   SpO2 93%   BMI 29.91 kg/m   Weight yesterday- 191 lb Last visit weight- 191 lb  Mr Nall was seen at home today and reported feeling generally well. He denied SOB, headache, dizziness or orthopnea. He has been compliant with his medications which were verified and his pillbox was refilled. Nothing further  was necessary at the time of our visit.   [name], EMT 08/16/18  ACTION: Home visit completed Next visit planned for 1 week

## 2018-08-23 ENCOUNTER — Other Ambulatory Visit (HOSPITAL_COMMUNITY): Payer: Self-pay

## 2018-08-23 NOTE — Progress Notes (Signed)
Paramedicine Encounter    Patient ID: Andrew [name], male    DOB: [birth year], 84 y.o.   MRN: [id number]   Patient Care Team: [name], MD as PCP - General (Family Medicine) Larey Dresser, MD as Consulting Physician (Cardiology)  Patient Active Problem List   Diagnosis Date Noted  . Bilateral recurrent inguinal hernias 11/28/2017  . Calf pain 05/25/2016  . Carotid artery stenosis 01/12/2016  . Chronic systolic CHF (congestive heart failure) ([place]) 12/02/2015  . Cerebellar infarct ([place]) 10/16/2015  . 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 ([place]) 10/16/2015  . PVD (peripheral vascular disease) ([place])   . Hereditary and idiopathic peripheral neuropathy 10/02/2015  . Peripelvic (lymphatic) cyst   . Pernicious anemia 07/15/2015  . Bilateral renal cysts 07/12/2015  . Lung nodule, 51mm RML CT 07/12/15 07/12/2015  . Constipation 07/12/2015  . Kidney stone on right side 07/12/2015  . Stage 3 chronic kidney disease ([place]) 06/14/2015  . Chronic diastolic CHF (congestive heart failure) ([place]) 05/28/2015  . Paroxysmal atrial fibrillation ([place]) 05/28/2015  . AAA (abdominal aortic aneurysm) without rupture ([place]) 09/30/2014  . Left-sided low back pain without sciatica 07/21/2014  . Cardiomyopathy, ischemic 05/23/2014  . DM (diabetes mellitus), type 2 with renal complications ([place]) 52/84/1324  . CORONARY ATHEROSCLEROSIS NATIVE CORONARY ARTERY 11/09/2010  . Depression, major, single episode, complete remission ([place]) 03/18/2010  . Hypothyroidism 02/03/2009  . Hyperlipidemia associated with type 2 diabetes mellitus ([place]) 06/14/2007  . Hypertension associated with diabetes ([place]) 06/14/2007  . GERD 06/14/2007    Current Outpatient Medications:  .  ALPRAZolam (XANAX) 0.5 MG tablet, TAKE 1/2 TABLET TWICE DAILY AS NEEDED FOR ANXIETY., Disp: 60 tablet, Rfl: 5 .  amiodarone (PACERONE) 200 MG tablet, Take 0.5 tablets (100 mg total) by mouth daily., Disp: 45 tablet, Rfl: 3 .   atorvastatin (LIPITOR) 80 MG tablet, Take 1 tablet (80 mg total) by mouth daily. (Patient taking differently: Take 80 mg by mouth every evening. ), Disp: 30 tablet, Rfl: 3 .  carvedilol (COREG) 12.5 MG tablet, TAKE 1 TABLET BY MOUTH TWICE DAILY., Disp: 180 tablet, Rfl: 3 .  cilostazol (PLETAL) 100 MG tablet, TAKE 1 TABLET BY MOUTH TWICE DAILY., Disp: 180 tablet, Rfl: 3 .  ELIQUIS 2.5 MG TABS tablet, TAKE 1 TABLET BY MOUTH TWICE DAILY., Disp: 60 tablet, Rfl: 11 .  ezetimibe (ZETIA) 10 MG tablet, TAKE (1) TABLET DAILY., Disp: 90 tablet, Rfl: 0 .  FLUoxetine (PROZAC) 20 MG capsule, TAKE (1) CAPSULE DAILY., Disp: 90 capsule, Rfl: 0 .  furosemide (LASIX) 80 MG tablet, Take 1.5 tablets (120 mg total) by mouth every morning AND 1 tablet (80 mg total) every evening., Disp: 75 tablet, Rfl: 6 .  glucose blood (ACCU-CHEK AVIVA PLUS) test strip, CHECK BLOOD SUGAR 4 TIMES A DAY., Disp: 100 each, Rfl: 11 .  hydrALAZINE (APRESOLINE) 25 MG tablet, Take 1 tablet (25 mg total) by mouth 3 (three) times daily. D/c order for Losartan., Disp: 90 tablet, Rfl: 3 .  HYDROcodone-acetaminophen (NORCO/VICODIN) 5-325 MG tablet, Take 1 tablet by mouth every 4 (four) hours as needed for moderate pain., Disp: 24 tablet, Rfl: 0 .  Lancets MISC, Check blood sugar 4 times a day, Disp: 100 each, Rfl: 11 .  levothyroxine (SYNTHROID, LEVOTHROID) 125 MCG tablet, Take 1 tablet (125 mcg total) by mouth daily., Disp: 90 tablet, Rfl: 1 .  Multiple Vitamin (MULTIVITAMIN) tablet, Take 1 tablet by mouth daily., Disp: , Rfl:  .  potassium chloride SA (K-DUR,KLOR-CON) 20 MEQ  tablet, Take 1 tablet (20 mEq total) by mouth daily., Disp: 90 tablet, Rfl: 3 .  temazepam (RESTORIL) 30 MG capsule, TAKE 1 CAPSULE AT BEDTIME AS NEEDED FOR SLEEP., Disp: 30 capsule, Rfl: 0 .  tiotropium (SPIRIVA HANDIHALER) 18 MCG inhalation capsule, Place 1 capsule (18 mcg total) into inhaler and inhale daily., Disp: 30 capsule, Rfl: 12 .  acetaminophen (TYLENOL) 500 MG  tablet, Take 1,000 mg by mouth every 8 (eight) hours as needed for moderate pain. , Disp: , Rfl:  .  amiodarone (PACERONE) 100 MG tablet, Take 1 tablet (100 mg total) by mouth daily. (Patient not taking: Reported on 08/23/2018), Disp: 30 tablet, Rfl: 3 .  Investigational - Study Medication, Take 1 tablet by mouth daily. Study name: [place]  Additional study details: MK-1242 (vericiguat) or Placebo (Patient not taking: Reported on 07/12/2018), Disp: 1 each, Rfl: PRN .  NITROSTAT 0.4 MG SL tablet, DISSOLVE 1 TABLET UNDER TONGUE AS NEEDED FOR CHEST PAIN,MAY REPEAT IN5 MINUTES FOR 2 DOSES. (Patient not taking: Reported on 07/12/2018), Disp: 25 tablet, Rfl: 6 .  Potassium Chloride ER 20 MEQ TBCR, Take 20 mEq by mouth daily., Disp: 30 tablet, Rfl: 3 .  traMADol (ULTRAM) 50 MG tablet, Take 1 tablet (50 mg total) by mouth every 6 (six) hours as needed. (Patient not taking: Reported on 07/12/2018), Disp: 12 tablet, Rfl: 0 Allergies  Allergen Reactions  . Metoprolol Shortness Of Breath, Palpitations and Other (See Comments)    Heart starts racing. Shallow breathing   . Metformin   . Metformin And Related Nausea And Vomiting      Social History   Socioeconomic History  . Marital status: Married    Spouse name: Not on file  . Number of children: Not on file  . Years of education: Not on file  . Highest education level: Not on file  Occupational History  . 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 Financial resource strain: Not on file  . Food insecurity:    Worry: Not on file    Inability: Not on file  . Transportation needs:    Medical: Not on file    Non-medical: Not on file  Tobacco Use  . Smoking status: Former Smoker    Packs/day: 1.50    Years: 35.00    Pack years: 52.50    Types: Cigarettes  . Smokeless tobacco: Never Used  . Tobacco comment: quit atleast 25 yrs ago, per pt  Substance and Sexual Activity  . Alcohol use: Yes    Alcohol/week: 3.0 standard drinks    Types: 3 Shots of  liquor per week    Comment: socially  . Drug use: No  . Sexual activity: Yes  Lifestyle  . Physical activity:    Days per week: Not on file    Minutes per session: Not on file  . Stress: Not on file  Relationships  . Social connections:    Talks on phone: Not on file    Gets together: Not on file    Attends religious service: Not on file    Active member of club or organization: Not on file    Attends meetings of clubs or organizations: Not on file    Relationship status: Not on file  . Intimate partner violence:    Fear of current or ex partner: Not on file    Emotionally abused: Not on file    Physically abused: Not on file    Forced sexual activity: Not on file  Other  Topics Concern  . Not on file  Social History Narrative   Pt lives in [place] with spouse.   Retired from [employer].  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    Physical Exam  Constitutional: He is oriented to person, place, and time.  Cardiovascular: Normal rate and regular rhythm.  Pulmonary/Chest: Effort normal and breath sounds normal.  Abdominal: Soft.  Musculoskeletal: Normal range of motion. He exhibits no edema.  Neurological: He is alert and oriented to person, place, and time.  Skin: Skin is warm and dry.  Psychiatric: He has a normal mood and affect.        Future Appointments  Date Time Provider [place]  09/14/2018  2:40 PM Larey Dresser, MD [facility] None  09/18/2018  3:15 PM Gardiner Barefoot, DPM [facility]    BP 120/60 (BP Location: Left Arm, Patient Position: Sitting, Cuff Size: Large)   Pulse 77   Resp 16   Wt 192 lb 9.6 oz (87.4 kg)   SpO2 91%   BMI 30.17 kg/m   Weight yesterday- Did not weigh Last visit weight- 191 lb  Mr Bade was seen at home today and reported feeling well. He denied SOB, headache, dizziness or orthopnea. He has been compliant with his medication over the past week. His medications  were verified and his pillbox was refilled. Nothing further was necessary during the time of our visit.   [name], EMT 08/23/18  ACTION: Home visit completed Next visit planned for 1 week

## 2018-08-30 ENCOUNTER — Other Ambulatory Visit (HOSPITAL_COMMUNITY): Payer: Self-pay

## 2018-08-30 ENCOUNTER — Telehealth (HOSPITAL_COMMUNITY): Payer: Self-pay

## 2018-08-30 NOTE — Telephone Encounter (Signed)
I returned a phone call from Andrew [name]. He had called me to say he has an appointment at 14:30 so wanted me to come around 13:30 or earlier. I agreed and planned to meet with him at 13:00.

## 2018-08-30 NOTE — Progress Notes (Signed)
Paramedicine Encounter    Patient ID: Andrew [name], male    DOB: 07-19-1928, 84 y.o.   MRN: [id number]   Patient Care Team: [name], MD as PCP - General (Family Medicine) Larey Dresser, MD as Consulting Physician (Cardiology)  Patient Active Problem List   Diagnosis Date Noted  . Bilateral recurrent inguinal hernias 11/28/2017  . Calf pain 05/25/2016  . Carotid artery stenosis 01/12/2016  . Chronic systolic CHF (congestive heart failure) ([place]) 12/02/2015  . Cerebellar infarct ([place]) 10/16/2015  . 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 PVD (peripheral vascular disease) ([place])   . Hereditary and idiopathic peripheral neuropathy 10/02/2015  . Peripelvic (lymphatic) cyst   . Pernicious anemia 07/15/2015  . Bilateral renal cysts 07/12/2015  . Lung nodule, 20mm RML CT 07/12/15 07/12/2015  . Constipation 07/12/2015  . Kidney stone on right side 07/12/2015  . Stage 3 chronic kidney disease ([place]) 06/14/2015  . Chronic diastolic CHF (congestive heart failure) ([place]) 05/28/2015  . Paroxysmal atrial fibrillation ([place]) 05/28/2015  . AAA (abdominal aortic aneurysm) without rupture ([place]) 09/30/2014  . Left-sided low back pain without sciatica 07/21/2014  . Cardiomyopathy, ischemic 05/23/2014  . DM (diabetes mellitus), type 2 with renal complications ([place]) 09/32/3557  . CORONARY ATHEROSCLEROSIS NATIVE CORONARY ARTERY 11/09/2010  . Depression, major, single episode, complete remission ([place]) 03/18/2010  . Hypothyroidism 02/03/2009  . Hyperlipidemia associated with type 2 diabetes mellitus ([place]) 06/14/2007  . Hypertension associated with diabetes ([place]) 06/14/2007  . GERD 06/14/2007    Current Outpatient Medications:  .  acetaminophen (TYLENOL) 500 MG tablet, Take 1,000 mg by mouth every 8 (eight) hours as needed for moderate pain. , Disp: , Rfl:  .  ALPRAZolam (XANAX) 0.5 MG tablet, TAKE 1/2 TABLET TWICE DAILY AS NEEDED FOR ANXIETY., Disp: 60 tablet,  Rfl: 5 .  amiodarone (PACERONE) 200 MG tablet, Take 0.5 tablets (100 mg total) by mouth daily., Disp: 45 tablet, Rfl: 3 .  atorvastatin (LIPITOR) 80 MG tablet, Take 1 tablet (80 mg total) by mouth daily. (Patient taking differently: Take 80 mg by mouth every evening. ), Disp: 30 tablet, Rfl: 3 .  carvedilol (COREG) 12.5 MG tablet, TAKE 1 TABLET BY MOUTH TWICE DAILY., Disp: 180 tablet, Rfl: 3 .  cilostazol (PLETAL) 100 MG tablet, TAKE 1 TABLET BY MOUTH TWICE DAILY., Disp: 180 tablet, Rfl: 3 .  ELIQUIS 2.5 MG TABS tablet, TAKE 1 TABLET BY MOUTH TWICE DAILY., Disp: 60 tablet, Rfl: 11 .  ezetimibe (ZETIA) 10 MG tablet, TAKE (1) TABLET DAILY., Disp: 90 tablet, Rfl: 0 .  FLUoxetine (PROZAC) 20 MG capsule, TAKE (1) CAPSULE DAILY., Disp: 90 capsule, Rfl: 0 .  furosemide (LASIX) 80 MG tablet, Take 1.5 tablets (120 mg total) by mouth every morning AND 1 tablet (80 mg total) every evening., Disp: 75 tablet, Rfl: 6 .  hydrALAZINE (APRESOLINE) 25 MG tablet, Take 1 tablet (25 mg total) by mouth 3 (three) times daily. D/c order for Losartan., Disp: 90 tablet, Rfl: 3 .  HYDROcodone-acetaminophen (NORCO/VICODIN) 5-325 MG tablet, Take 1 tablet by mouth every 4 (four) hours as needed for moderate pain., Disp: 24 tablet, Rfl: 0 .  levothyroxine (SYNTHROID, LEVOTHROID) 125 MCG tablet, Take 1 tablet (125 mcg total) by mouth daily., Disp: 90 tablet, Rfl: 1 .  Multiple Vitamin (MULTIVITAMIN) tablet, Take 1 tablet by mouth daily., Disp: , Rfl:  .  Potassium Chloride ER 20 MEQ TBCR, Take 20 mEq by mouth daily., Disp: 30 tablet, Rfl: 3 .  temazepam (RESTORIL) 30 MG capsule, TAKE 1 CAPSULE AT BEDTIME AS NEEDED FOR SLEEP., Disp: 30 capsule, Rfl: 0 .  tiotropium (SPIRIVA HANDIHALER) 18 MCG inhalation capsule, Place 1 capsule (18 mcg total) into inhaler and inhale daily., Disp: 30 capsule, Rfl: 12 .  amiodarone (PACERONE) 100 MG tablet, Take 1 tablet (100 mg total) by mouth daily. (Patient not taking: Reported on 08/23/2018), Disp:  30 tablet, Rfl: 3 .  glucose blood (ACCU-CHEK AVIVA PLUS) test strip, CHECK BLOOD SUGAR 4 TIMES A DAY., Disp: 100 each, Rfl: 11 .  Investigational - Study Medication, Take 1 tablet by mouth daily. Study name: [place]  Additional study details: MK-1242 (vericiguat) or Placebo (Patient not taking: Reported on 07/12/2018), Disp: 1 each, Rfl: PRN .  Lancets MISC, Check blood sugar 4 times a day, Disp: 100 each, Rfl: 11 .  NITROSTAT 0.4 MG SL tablet, DISSOLVE 1 TABLET UNDER TONGUE AS NEEDED FOR CHEST PAIN,MAY REPEAT IN5 MINUTES FOR 2 DOSES. (Patient not taking: Reported on 07/12/2018), Disp: 25 tablet, Rfl: 6 .  potassium chloride SA (K-DUR,KLOR-CON) 20 MEQ tablet, Take 1 tablet (20 mEq total) by mouth daily., Disp: 90 tablet, Rfl: 3 .  traMADol (ULTRAM) 50 MG tablet, Take 1 tablet (50 mg total) by mouth every 6 (six) hours as needed. (Patient not taking: Reported on 07/12/2018), Disp: 12 tablet, Rfl: 0 Allergies  Allergen Reactions  . Metoprolol Shortness Of Breath, Palpitations and Other (See Comments)    Heart starts racing. Shallow breathing   . Metformin   . Metformin And Related Nausea And Vomiting      Social History   Socioeconomic History  . Marital status: Married    Spouse name: Not on file  . Number of children: Not on file  . Years of education: Not on file  . Highest education level: Not on file  Occupational History  . 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 Financial resource strain: Not on file  . Food insecurity:    Worry: Not on file    Inability: Not on file  . Transportation needs:    Medical: Not on file    Non-medical: Not on file  Tobacco Use  . Smoking status: Former Smoker    Packs/day: 1.50    Years: 35.00    Pack years: 52.50    Types: Cigarettes  . Smokeless tobacco: Never Used  . Tobacco comment: quit atleast 25 yrs ago, per pt  Substance and Sexual Activity  . Alcohol use: Yes    Alcohol/week: 3.0 standard drinks    Types: 3 Shots of liquor per week     Comment: socially  . Drug use: No  . Sexual activity: Yes  Lifestyle  . Physical activity:    Days per week: Not on file    Minutes per session: Not on file  . Stress: Not on file  Relationships  . Social connections:    Talks on phone: Not on file    Gets together: Not on file    Attends religious service: Not on file    Active member of club or organization: Not on file    Attends meetings of clubs or organizations: Not on file    Relationship status: Not on file  . Intimate partner violence:    Fear of current or ex partner: Not on file    Emotionally abused: Not on file    Physically abused: Not on file    Forced sexual activity: Not on file  Other Topics Concern  .  Not on file  Social History Narrative   Pt lives in [place] with spouse.   Retired from [employer].  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    Physical Exam  Constitutional: He is oriented to person, place, and time.  Cardiovascular: Normal rate and regular rhythm.  Pulmonary/Chest: Effort normal and breath sounds normal.  Abdominal: Soft.  Musculoskeletal: Normal range of motion. He exhibits edema.  Neurological: He is alert and oriented to person, place, and time.  Skin: Skin is warm and dry.  Psychiatric: He has a normal mood and affect.        Future Appointments  Date Time Provider [place]  09/14/2018  2:40 PM Larey Dresser, MD [facility] None  09/18/2018  3:15 PM Gardiner Barefoot, DPM [facility]    BP 124/60 (BP Location: Left Arm, Patient Position: Sitting, Cuff Size: Large)   Pulse 71   Resp 16   Wt 194 lb 6.4 oz (88.2 kg)   SpO2 91%   BMI 30.45 kg/m   Weight yesterday- 194 lb Last visit weight- 192 lb  Mr Mcglasson was seen at home today and reported feeling well. He stated he had been compliant with his medications which was mostly true and his weight has been stable since last week. He denied SOB, headache,  dizziness or orthopnea. His medications were verified and his pillbox was refilled. Nothing further was necessary during the tim of my visit.   [name], EMT 08/30/18  ACTION: Home visit completed Next visit planned for 1 week

## 2018-09-03 LAB — HM DIABETES EYE EXAM

## 2018-09-05 ENCOUNTER — Telehealth (HOSPITAL_COMMUNITY): Payer: Self-pay

## 2018-09-06 ENCOUNTER — Other Ambulatory Visit (HOSPITAL_COMMUNITY): Payer: Self-pay

## 2018-09-06 NOTE — Progress Notes (Signed)
Paramedicine Encounter    Patient ID: Andrew [name], male    DOB: July 03, 1928, 84 y.o.   MRN: [id number]   Patient Care Team: [name], MD as PCP - General (Family Medicine) Larey Dresser, MD as Consulting Physician (Cardiology)  Patient Active Problem List   Diagnosis Date Noted  . Bilateral recurrent inguinal hernias 11/28/2017  . Calf pain 05/25/2016  . Carotid artery stenosis 01/12/2016  . Chronic systolic CHF (congestive heart failure) ([place]) 12/02/2015  . Cerebellar infarct ([place]) 10/16/2015  . 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 PVD (peripheral vascular disease) ([place])   . Hereditary and idiopathic peripheral neuropathy 10/02/2015  . Peripelvic (lymphatic) cyst   . Pernicious anemia 07/15/2015  . Bilateral renal cysts 07/12/2015  . Lung nodule, 62mm RML CT 07/12/15 07/12/2015  . Constipation 07/12/2015  . Kidney stone on right side 07/12/2015  . Stage 3 chronic kidney disease ([place]) 06/14/2015  . Chronic diastolic CHF (congestive heart failure) ([place]) 05/28/2015  . Paroxysmal atrial fibrillation ([place]) 05/28/2015  . AAA (abdominal aortic aneurysm) without rupture ([place]) 09/30/2014  . Left-sided low back pain without sciatica 07/21/2014  . Cardiomyopathy, ischemic 05/23/2014  . DM (diabetes mellitus), type 2 with renal complications ([place]) 99/24/2683  . CORONARY ATHEROSCLEROSIS NATIVE CORONARY ARTERY 11/09/2010  . Depression, major, single episode, complete remission ([place]) 03/18/2010  . Hypothyroidism 02/03/2009  . Hyperlipidemia associated with type 2 diabetes mellitus ([place]) 06/14/2007  . Hypertension associated with diabetes ([place]) 06/14/2007  . GERD 06/14/2007    Current Outpatient Medications:  .  acetaminophen (TYLENOL) 500 MG tablet, Take 1,000 mg by mouth every 8 (eight) hours as needed for moderate pain. , Disp: , Rfl:  .  ALPRAZolam (XANAX) 0.5 MG tablet, TAKE 1/2 TABLET TWICE DAILY AS NEEDED FOR ANXIETY., Disp: 60 tablet,  Rfl: 5 .  amiodarone (PACERONE) 200 MG tablet, Take 0.5 tablets (100 mg total) by mouth daily., Disp: 45 tablet, Rfl: 3 .  atorvastatin (LIPITOR) 80 MG tablet, Take 1 tablet (80 mg total) by mouth daily. (Patient taking differently: Take 80 mg by mouth every evening. ), Disp: 30 tablet, Rfl: 3 .  carvedilol (COREG) 12.5 MG tablet, TAKE 1 TABLET BY MOUTH TWICE DAILY., Disp: 180 tablet, Rfl: 3 .  cilostazol (PLETAL) 100 MG tablet, TAKE 1 TABLET BY MOUTH TWICE DAILY., Disp: 180 tablet, Rfl: 3 .  ELIQUIS 2.5 MG TABS tablet, TAKE 1 TABLET BY MOUTH TWICE DAILY., Disp: 60 tablet, Rfl: 11 .  ezetimibe (ZETIA) 10 MG tablet, TAKE (1) TABLET DAILY., Disp: 90 tablet, Rfl: 0 .  FLUoxetine (PROZAC) 20 MG capsule, TAKE (1) CAPSULE DAILY., Disp: 90 capsule, Rfl: 0 .  furosemide (LASIX) 80 MG tablet, Take 1.5 tablets (120 mg total) by mouth every morning AND 1 tablet (80 mg total) every evening., Disp: 75 tablet, Rfl: 6 .  hydrALAZINE (APRESOLINE) 25 MG tablet, Take 1 tablet (25 mg total) by mouth 3 (three) times daily. D/c order for Losartan., Disp: 90 tablet, Rfl: 3 .  HYDROcodone-acetaminophen (NORCO/VICODIN) 5-325 MG tablet, Take 1 tablet by mouth every 4 (four) hours as needed for moderate pain., Disp: 24 tablet, Rfl: 0 .  levothyroxine (SYNTHROID, LEVOTHROID) 125 MCG tablet, Take 1 tablet (125 mcg total) by mouth daily., Disp: 90 tablet, Rfl: 1 .  Multiple Vitamin (MULTIVITAMIN) tablet, Take 1 tablet by mouth daily., Disp: , Rfl:  .  potassium chloride SA (K-DUR,KLOR-CON) 20 MEQ tablet, Take 1 tablet (20 mEq total) by mouth daily., Disp:  90 tablet, Rfl: 3 .  temazepam (RESTORIL) 30 MG capsule, TAKE 1 CAPSULE AT BEDTIME AS NEEDED FOR SLEEP., Disp: 30 capsule, Rfl: 0 .  tiotropium (SPIRIVA HANDIHALER) 18 MCG inhalation capsule, Place 1 capsule (18 mcg total) into inhaler and inhale daily., Disp: 30 capsule, Rfl: 12 .  amiodarone (PACERONE) 100 MG tablet, Take 1 tablet (100 mg total) by mouth daily. (Patient not  taking: Reported on 08/23/2018), Disp: 30 tablet, Rfl: 3 .  glucose blood (ACCU-CHEK AVIVA PLUS) test strip, CHECK BLOOD SUGAR 4 TIMES A DAY., Disp: 100 each, Rfl: 11 .  Investigational - Study Medication, Take 1 tablet by mouth daily. Study name: [place]  Additional study details: MK-1242 (vericiguat) or Placebo (Patient not taking: Reported on 07/12/2018), Disp: 1 each, Rfl: PRN .  Lancets MISC, Check blood sugar 4 times a day, Disp: 100 each, Rfl: 11 .  NITROSTAT 0.4 MG SL tablet, DISSOLVE 1 TABLET UNDER TONGUE AS NEEDED FOR CHEST PAIN,MAY REPEAT IN5 MINUTES FOR 2 DOSES. (Patient not taking: Reported on 07/12/2018), Disp: 25 tablet, Rfl: 6 .  Potassium Chloride ER 20 MEQ TBCR, Take 20 mEq by mouth daily., Disp: 30 tablet, Rfl: 3 .  traMADol (ULTRAM) 50 MG tablet, Take 1 tablet (50 mg total) by mouth every 6 (six) hours as needed. (Patient not taking: Reported on 07/12/2018), Disp: 12 tablet, Rfl: 0 Allergies  Allergen Reactions  . Metoprolol Shortness Of Breath, Palpitations and Other (See Comments)    Heart starts racing. Shallow breathing   . Metformin   . Metformin And Related Nausea And Vomiting      Social History   Socioeconomic History  . Marital status: Married    Spouse name: Not on file  . Number of children: Not on file  . Years of education: Not on file  . Highest education level: Not on file  Occupational History  . 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 Financial resource strain: Not on file  . Food insecurity:    Worry: Not on file    Inability: Not on file  . Transportation needs:    Medical: Not on file    Non-medical: Not on file  Tobacco Use  . Smoking status: Former Smoker    Packs/day: 1.50    Years: 35.00    Pack years: 52.50    Types: Cigarettes  . Smokeless tobacco: Never Used  . Tobacco comment: quit atleast 25 yrs ago, per pt  Substance and Sexual Activity  . Alcohol use: Yes    Alcohol/week: 3.0 standard drinks    Types: 3 Shots of liquor per  week    Comment: socially  . Drug use: No  . Sexual activity: Yes  Lifestyle  . Physical activity:    Days per week: Not on file    Minutes per session: Not on file  . Stress: Not on file  Relationships  . Social connections:    Talks on phone: Not on file    Gets together: Not on file    Attends religious service: Not on file    Active member of club or organization: Not on file    Attends meetings of clubs or organizations: Not on file    Relationship status: Not on file  . Intimate partner violence:    Fear of current or ex partner: Not on file    Emotionally abused: Not on file    Physically abused: Not on file    Forced sexual activity: Not on file  Other  Topics Concern  . Not on file  Social History Narrative   Pt lives in [place] with spouse.   Retired from [employer].  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    Physical Exam  Constitutional: He is oriented to person, place, and time.  Cardiovascular: Normal rate and regular rhythm.  Pulmonary/Chest: Effort normal and breath sounds normal.  Abdominal: Soft.  Musculoskeletal: Normal range of motion. He exhibits no edema.  Neurological: He is alert and oriented to person, place, and time.  Skin: Skin is warm.  Psychiatric: He has a normal mood and affect.        Future Appointments  Date Time Provider [place]  09/14/2018  2:40 PM Larey Dresser, MD [facility] None  09/18/2018  3:15 PM Gardiner Barefoot, DPM [facility]    BP 122/64 (BP Location: Left Arm, Patient Position: Sitting, Cuff Size: Large)   Pulse 80   Resp 16   Wt 193 lb 3.2 oz (87.6 kg)   SpO2 91%   BMI 30.26 kg/m   Weight yesterday- 194 lb Last visit weight- 194 lb  Mr Jipson was seen at home today and reported feeling well. He denied SOB, headache, dizziness or orthopnea. He stated he has been compliant with his medications and his weigh has been stable since last week. His  medications were verified and his pillbox was refilled.   [name], EMT 09/06/18  ACTION: Home visit completed Next visit planned for 1 week

## 2018-09-06 NOTE — Telephone Encounter (Signed)
Andrew [name] called me to say he has an appointment on Thursday during out regularly schedule meeting time and asked if I could come by a little earlier. We agreed to meet around 13:00.

## 2018-09-12 ENCOUNTER — Telehealth (HOSPITAL_COMMUNITY): Payer: Self-pay

## 2018-09-13 ENCOUNTER — Other Ambulatory Visit (HOSPITAL_COMMUNITY): Payer: Self-pay

## 2018-09-13 NOTE — Progress Notes (Signed)
Paramedicine Encounter    Patient ID: Andrew [name], male    DOB: Feb 27, 1928, 84 y.o.   MRN: [id number]   Patient Care Team: [name], MD as PCP - General (Family Medicine) Larey Dresser, MD as Consulting Physician (Cardiology)  Patient Active Problem List   Diagnosis Date Noted  . Bilateral recurrent inguinal hernias 11/28/2017  . Calf pain 05/25/2016  . Carotid artery stenosis 01/12/2016  . Chronic systolic CHF (congestive heart failure) ([place]) 12/02/2015  . Cerebellar infarct ([place]) 10/16/2015  . 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 ([place]) 10/16/2015  . PVD (peripheral vascular disease) ([place])   . Hereditary and idiopathic peripheral neuropathy 10/02/2015  . Peripelvic (lymphatic) cyst   . Pernicious anemia 07/15/2015  . Bilateral renal cysts 07/12/2015  . Lung nodule, 50mm RML CT 07/12/15 07/12/2015  . Constipation 07/12/2015  . Kidney stone on right side 07/12/2015  . Stage 3 chronic kidney disease ([place]) 06/14/2015  . Chronic diastolic CHF (congestive heart failure) ([place]) 05/28/2015  . Paroxysmal atrial fibrillation ([place]) 05/28/2015  . AAA (abdominal aortic aneurysm) without rupture ([place]) 09/30/2014  . Left-sided low back pain without sciatica 07/21/2014  . Cardiomyopathy, ischemic 05/23/2014  . DM (diabetes mellitus), type 2 with renal complications ([place]) 99/37/1696  . CORONARY ATHEROSCLEROSIS NATIVE CORONARY ARTERY 11/09/2010  . Depression, major, single episode, complete remission ([place]) 03/18/2010  . Hypothyroidism 02/03/2009  . Hyperlipidemia associated with type 2 diabetes mellitus ([place]) 06/14/2007  . Hypertension associated with diabetes ([place]) 06/14/2007  . GERD 06/14/2007    Current Outpatient Medications:  .  acetaminophen (TYLENOL) 500 MG tablet, Take 1,000 mg by mouth every 8 (eight) hours as needed for moderate pain. , Disp: , Rfl:  .  ALPRAZolam (XANAX) 0.5 MG tablet, TAKE 1/2 TABLET TWICE DAILY AS NEEDED FOR ANXIETY., Disp: 60 tablet,  Rfl: 5 .  amiodarone (PACERONE) 200 MG tablet, Take 0.5 tablets (100 mg total) by mouth daily., Disp: 45 tablet, Rfl: 3 .  atorvastatin (LIPITOR) 80 MG tablet, Take 1 tablet (80 mg total) by mouth daily. (Patient taking differently: Take 80 mg by mouth every evening. ), Disp: 30 tablet, Rfl: 3 .  carvedilol (COREG) 12.5 MG tablet, TAKE 1 TABLET BY MOUTH TWICE DAILY., Disp: 180 tablet, Rfl: 3 .  cilostazol (PLETAL) 100 MG tablet, TAKE 1 TABLET BY MOUTH TWICE DAILY., Disp: 180 tablet, Rfl: 3 .  ELIQUIS 2.5 MG TABS tablet, TAKE 1 TABLET BY MOUTH TWICE DAILY., Disp: 60 tablet, Rfl: 11 .  ezetimibe (ZETIA) 10 MG tablet, TAKE (1) TABLET DAILY., Disp: 90 tablet, Rfl: 0 .  FLUoxetine (PROZAC) 20 MG capsule, TAKE (1) CAPSULE DAILY., Disp: 90 capsule, Rfl: 0 .  furosemide (LASIX) 80 MG tablet, Take 1.5 tablets (120 mg total) by mouth every morning AND 1 tablet (80 mg total) every evening., Disp: 75 tablet, Rfl: 6 .  hydrALAZINE (APRESOLINE) 25 MG tablet, Take 1 tablet (25 mg total) by mouth 3 (three) times daily. D/c order for Losartan., Disp: 90 tablet, Rfl: 3 .  HYDROcodone-acetaminophen (NORCO/VICODIN) 5-325 MG tablet, Take 1 tablet by mouth every 4 (four) hours as needed for moderate pain., Disp: 24 tablet, Rfl: 0 .  levothyroxine (SYNTHROID, LEVOTHROID) 125 MCG tablet, Take 1 tablet (125 mcg total) by mouth daily., Disp: 90 tablet, Rfl: 1 .  Multiple Vitamin (MULTIVITAMIN) tablet, Take 1 tablet by mouth daily., Disp: , Rfl:  .  potassium chloride SA (K-DUR,KLOR-CON) 20 MEQ tablet, Take 1 tablet (20 mEq total) by mouth daily., Disp:  90 tablet, Rfl: 3 .  temazepam (RESTORIL) 30 MG capsule, TAKE 1 CAPSULE AT BEDTIME AS NEEDED FOR SLEEP., Disp: 30 capsule, Rfl: 0 .  tiotropium (SPIRIVA HANDIHALER) 18 MCG inhalation capsule, Place 1 capsule (18 mcg total) into inhaler and inhale daily., Disp: 30 capsule, Rfl: 12 .  amiodarone (PACERONE) 100 MG tablet, Take 1 tablet (100 mg total) by mouth daily. (Patient not  taking: Reported on 08/23/2018), Disp: 30 tablet, Rfl: 3 .  glucose blood (ACCU-CHEK AVIVA PLUS) test strip, CHECK BLOOD SUGAR 4 TIMES A DAY., Disp: 100 each, Rfl: 11 .  Investigational - Study Medication, Take 1 tablet by mouth daily. Study name: [place]  Additional study details: MK-1242 (vericiguat) or Placebo (Patient not taking: Reported on 07/12/2018), Disp: 1 each, Rfl: PRN .  Lancets MISC, Check blood sugar 4 times a day, Disp: 100 each, Rfl: 11 .  NITROSTAT 0.4 MG SL tablet, DISSOLVE 1 TABLET UNDER TONGUE AS NEEDED FOR CHEST PAIN,MAY REPEAT IN5 MINUTES FOR 2 DOSES. (Patient not taking: Reported on 07/12/2018), Disp: 25 tablet, Rfl: 6 .  Potassium Chloride ER 20 MEQ TBCR, Take 20 mEq by mouth daily., Disp: 30 tablet, Rfl: 3 .  traMADol (ULTRAM) 50 MG tablet, Take 1 tablet (50 mg total) by mouth every 6 (six) hours as needed. (Patient not taking: Reported on 07/12/2018), Disp: 12 tablet, Rfl: 0 Allergies  Allergen Reactions  . Metoprolol Shortness Of Breath, Palpitations and Other (See Comments)    Heart starts racing. Shallow breathing   . Metformin   . Metformin And Related Nausea And Vomiting      Social History   Socioeconomic History  . Marital status: Married    Spouse name: Not on file  . Number of children: Not on file  . Years of education: Not on file  . Highest education level: Not on file  Occupational History  . 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 Financial resource strain: Not on file  . Food insecurity:    Worry: Not on file    Inability: Not on file  . Transportation needs:    Medical: Not on file    Non-medical: Not on file  Tobacco Use  . Smoking status: Former Smoker    Packs/day: 1.50    Years: 35.00    Pack years: 52.50    Types: Cigarettes  . Smokeless tobacco: Never Used  . Tobacco comment: quit atleast 25 yrs ago, per pt  Substance and Sexual Activity  . Alcohol use: Yes    Alcohol/week: 3.0 standard drinks    Types: 3 Shots of liquor per  week    Comment: socially  . Drug use: No  . Sexual activity: Yes  Lifestyle  . Physical activity:    Days per week: Not on file    Minutes per session: Not on file  . Stress: Not on file  Relationships  . Social connections:    Talks on phone: Not on file    Gets together: Not on file    Attends religious service: Not on file    Active member of club or organization: Not on file    Attends meetings of clubs or organizations: Not on file    Relationship status: Not on file  . Intimate partner violence:    Fear of current or ex partner: Not on file    Emotionally abused: Not on file    Physically abused: Not on file    Forced sexual activity: Not on file  Other  Topics Concern  . Not on file  Social History Narrative   Pt lives in [place] with spouse.   Retired from [employer].  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    Physical Exam  Constitutional: He is oriented to person, place, and time.  Cardiovascular: Normal rate and regular rhythm.  Pulmonary/Chest: Effort normal and breath sounds normal.  Abdominal: Soft.  Musculoskeletal: Normal range of motion. He exhibits no edema.  Neurological: He is alert and oriented to person, place, and time.  Skin: Skin is warm and dry.  Psychiatric: He has a normal mood and affect.        Future Appointments  Date Time Provider [place]  09/14/2018  2:40 PM Larey Dresser, MD [facility] None  09/18/2018  3:15 PM Gardiner Barefoot, DPM [facility]    BP 140/78 (BP Location: Left Arm, Patient Position: Standing, Cuff Size: Large)   Pulse 72   Resp 16   Wt 195 lb (88.5 kg)   SpO2 94%   BMI 30.54 kg/m   Weight yesterday- Did not weigh Last visit weight- 193 lb  Mr Goeken was seen at home today and reported feeling well. He denied SOB, headache, dizziness or orthopnea. He reported being compliant with his medications over the past week and he weight has been  stable. His medications were verified and his pillbox was refilled.   [name], EMT 09/13/18  ACTION: Home visit completed Next visit planned for 1 week

## 2018-09-13 NOTE — Telephone Encounter (Signed)
Ms Ramthun called me to confirm my appointment with her husband tomorrow. She stated she would not be home in the morning and asked if I could come by in the early afternoon. We agreed on 13:30.

## 2018-09-14 ENCOUNTER — Ambulatory Visit (HOSPITAL_COMMUNITY)
Admission: RE | Admit: 2018-09-14 | Discharge: 2018-09-14 | Disposition: A | Discharge status: Home / Self Care | Payer: Medicare Other | Source: Ambulatory Visit | Attending: Cardiology | Admitting: Cardiology

## 2018-09-14 VITALS — BP 170/92 | HR 67 | Wt 195.2 lb

## 2018-09-14 DIAGNOSIS — I5032 Chronic diastolic (congestive) heart failure: Secondary | ICD-10-CM | POA: Diagnosis not present

## 2018-09-14 DIAGNOSIS — E785 Hyperlipidemia, unspecified: Secondary | ICD-10-CM | POA: Diagnosis not present

## 2018-09-14 DIAGNOSIS — I779 Disorder of arteries and arterioles, unspecified: Secondary | ICD-10-CM

## 2018-09-14 DIAGNOSIS — I480 Paroxysmal atrial fibrillation: Secondary | ICD-10-CM | POA: Diagnosis not present

## 2018-09-14 DIAGNOSIS — Z85820 Personal history of malignant melanoma of skin: Secondary | ICD-10-CM | POA: Insufficient documentation

## 2018-09-14 DIAGNOSIS — I739 Peripheral vascular disease, unspecified: Secondary | ICD-10-CM | POA: Diagnosis not present

## 2018-09-14 DIAGNOSIS — Z951 Presence of aortocoronary bypass graft: Secondary | ICD-10-CM | POA: Diagnosis not present

## 2018-09-14 DIAGNOSIS — I130 Hypertensive heart and chronic kidney disease with heart failure and stage 1 through stage 4 chronic kidney disease, or unspecified chronic kidney disease: Secondary | ICD-10-CM | POA: Insufficient documentation

## 2018-09-14 DIAGNOSIS — I252 Old myocardial infarction: Secondary | ICD-10-CM | POA: Insufficient documentation

## 2018-09-14 DIAGNOSIS — E1122 Type 2 diabetes mellitus with diabetic chronic kidney disease: Secondary | ICD-10-CM | POA: Insufficient documentation

## 2018-09-14 DIAGNOSIS — Z7901 Long term (current) use of anticoagulants: Secondary | ICD-10-CM | POA: Diagnosis not present

## 2018-09-14 DIAGNOSIS — I5022 Chronic systolic (congestive) heart failure: Secondary | ICD-10-CM | POA: Insufficient documentation

## 2018-09-14 DIAGNOSIS — Z87891 Personal history of nicotine dependence: Secondary | ICD-10-CM | POA: Diagnosis not present

## 2018-09-14 DIAGNOSIS — M1990 Unspecified osteoarthritis, unspecified site: Secondary | ICD-10-CM | POA: Insufficient documentation

## 2018-09-14 DIAGNOSIS — I2510 Atherosclerotic heart disease of native coronary artery without angina pectoris: Secondary | ICD-10-CM | POA: Diagnosis not present

## 2018-09-14 DIAGNOSIS — Z79890 Hormone replacement therapy (postmenopausal): Secondary | ICD-10-CM | POA: Insufficient documentation

## 2018-09-14 DIAGNOSIS — N183 Chronic kidney disease, stage 3 (moderate): Secondary | ICD-10-CM | POA: Insufficient documentation

## 2018-09-14 DIAGNOSIS — I255 Ischemic cardiomyopathy: Secondary | ICD-10-CM | POA: Diagnosis not present

## 2018-09-14 DIAGNOSIS — R911 Solitary pulmonary nodule: Secondary | ICD-10-CM | POA: Insufficient documentation

## 2018-09-14 DIAGNOSIS — I4892 Unspecified atrial flutter: Secondary | ICD-10-CM | POA: Diagnosis not present

## 2018-09-14 DIAGNOSIS — I6529 Occlusion and stenosis of unspecified carotid artery: Secondary | ICD-10-CM | POA: Insufficient documentation

## 2018-09-14 DIAGNOSIS — E039 Hypothyroidism, unspecified: Secondary | ICD-10-CM | POA: Diagnosis not present

## 2018-09-14 DIAGNOSIS — Z8673 Personal history of transient ischemic attack (TIA), and cerebral infarction without residual deficits: Secondary | ICD-10-CM | POA: Insufficient documentation

## 2018-09-14 DIAGNOSIS — Z79899 Other long term (current) drug therapy: Secondary | ICD-10-CM | POA: Diagnosis not present

## 2018-09-14 DIAGNOSIS — J449 Chronic obstructive pulmonary disease, unspecified: Secondary | ICD-10-CM | POA: Insufficient documentation

## 2018-09-14 LAB — COMPREHENSIVE METABOLIC PANEL
ALT: 33 U/L (ref 0–44)
ANION GAP: 11 (ref 5–15)
AST: 38 U/L (ref 15–41)
Albumin: 3.7 g/dL (ref 3.5–5.0)
Alkaline Phosphatase: 59 U/L (ref 38–126)
BILIRUBIN TOTAL: 0.9 mg/dL (ref 0.3–1.2)
BUN: 37 mg/dL — ABNORMAL HIGH (ref 8–23)
CO2: 27 mmol/L (ref 22–32)
Calcium: 9.1 mg/dL (ref 8.9–10.3)
Chloride: 102 mmol/L (ref 98–111)
Creatinine, Ser: 1.69 mg/dL — ABNORMAL HIGH (ref 0.61–1.24)
GFR, EST AFRICAN AMERICAN: 40 mL/min — AB (ref >60)
GFR, EST NON AFRICAN AMERICAN: 35 mL/min — AB (ref >60)
Glucose, Bld: 119 mg/dL — ABNORMAL HIGH (ref 70–99)
POTASSIUM: 3.9 mmol/L (ref 3.5–5.1)
Sodium: 140 mmol/L (ref 135–145)
TOTAL PROTEIN: 7 g/dL (ref 6.5–8.1)

## 2018-09-14 MED ORDER — ISOSORBIDE MONONITRATE ER 30 MG PO TB24: 30.0000 mg | ORAL_TABLET | Freq: Every day | ORAL | 6 refills | Status: DC

## 2018-09-14 MED ORDER — HYDRALAZINE HCL 50 MG PO TABS: 50.0000 mg | ORAL_TABLET | Freq: Three times a day (TID) | ORAL | 3 refills | Status: DC

## 2018-09-14 MED ORDER — ATORVASTATIN CALCIUM 80 MG PO TABS: 80.0000 mg | ORAL_TABLET | Freq: Every day | ORAL | 6 refills | Status: DC

## 2018-09-14 NOTE — Patient Instructions (Signed)
Increase Hydralazine to 50 mg Three times a day   Start Imdur (Isosorbide) 30 mg daily  Restart Atorvastatin 80 mg daily  Labs today  Your physician recommends that you schedule a follow-up appointment in: 3 months

## 2018-09-16 ENCOUNTER — Emergency Department (HOSPITAL_COMMUNITY): Payer: Medicare Other

## 2018-09-16 ENCOUNTER — Observation Stay (HOSPITAL_COMMUNITY)
Admission: EM | Admit: 2018-09-16 | Discharge: 2018-09-16 | Disposition: A | Discharge status: Home / Self Care | Payer: Medicare Other | Attending: Emergency Medicine | Admitting: Emergency Medicine

## 2018-09-16 ENCOUNTER — Other Ambulatory Visit: Payer: Self-pay

## 2018-09-16 ENCOUNTER — Encounter (HOSPITAL_COMMUNITY): Payer: Self-pay | Admitting: Obstetrics and Gynecology

## 2018-09-16 DIAGNOSIS — Z79899 Other long term (current) drug therapy: Secondary | ICD-10-CM | POA: Insufficient documentation

## 2018-09-16 DIAGNOSIS — R1031 Right lower quadrant pain: Secondary | ICD-10-CM

## 2018-09-16 DIAGNOSIS — E1151 Type 2 diabetes mellitus with diabetic peripheral angiopathy without gangrene: Secondary | ICD-10-CM | POA: Diagnosis not present

## 2018-09-16 DIAGNOSIS — Z951 Presence of aortocoronary bypass graft: Secondary | ICD-10-CM | POA: Insufficient documentation

## 2018-09-16 DIAGNOSIS — G8929 Other chronic pain: Secondary | ICD-10-CM | POA: Diagnosis not present

## 2018-09-16 DIAGNOSIS — Z8673 Personal history of transient ischemic attack (TIA), and cerebral infarction without residual deficits: Secondary | ICD-10-CM | POA: Diagnosis not present

## 2018-09-16 DIAGNOSIS — E1129 Type 2 diabetes mellitus with other diabetic kidney complication: Secondary | ICD-10-CM | POA: Diagnosis present

## 2018-09-16 DIAGNOSIS — I5042 Chronic combined systolic (congestive) and diastolic (congestive) heart failure: Secondary | ICD-10-CM | POA: Diagnosis not present

## 2018-09-16 DIAGNOSIS — Z87891 Personal history of nicotine dependence: Secondary | ICD-10-CM | POA: Insufficient documentation

## 2018-09-16 DIAGNOSIS — E1159 Type 2 diabetes mellitus with other circulatory complications: Secondary | ICD-10-CM | POA: Diagnosis present

## 2018-09-16 DIAGNOSIS — I130 Hypertensive heart and chronic kidney disease with heart failure and stage 1 through stage 4 chronic kidney disease, or unspecified chronic kidney disease: Secondary | ICD-10-CM | POA: Insufficient documentation

## 2018-09-16 DIAGNOSIS — M545 Low back pain: Secondary | ICD-10-CM | POA: Insufficient documentation

## 2018-09-16 DIAGNOSIS — J9611 Chronic respiratory failure with hypoxia: Secondary | ICD-10-CM | POA: Diagnosis present

## 2018-09-16 DIAGNOSIS — E785 Hyperlipidemia, unspecified: Secondary | ICD-10-CM | POA: Diagnosis not present

## 2018-09-16 DIAGNOSIS — E1122 Type 2 diabetes mellitus with diabetic chronic kidney disease: Secondary | ICD-10-CM | POA: Insufficient documentation

## 2018-09-16 DIAGNOSIS — R0902 Hypoxemia: Secondary | ICD-10-CM | POA: Diagnosis not present

## 2018-09-16 DIAGNOSIS — I10 Essential (primary) hypertension: Secondary | ICD-10-CM

## 2018-09-16 DIAGNOSIS — E039 Hypothyroidism, unspecified: Secondary | ICD-10-CM | POA: Insufficient documentation

## 2018-09-16 DIAGNOSIS — R1030 Lower abdominal pain, unspecified: Secondary | ICD-10-CM | POA: Diagnosis present

## 2018-09-16 DIAGNOSIS — Z86718 Personal history of other venous thrombosis and embolism: Secondary | ICD-10-CM | POA: Diagnosis not present

## 2018-09-16 DIAGNOSIS — Z85820 Personal history of malignant melanoma of skin: Secondary | ICD-10-CM | POA: Insufficient documentation

## 2018-09-16 DIAGNOSIS — Z7901 Long term (current) use of anticoagulants: Secondary | ICD-10-CM | POA: Insufficient documentation

## 2018-09-16 DIAGNOSIS — Z955 Presence of coronary angioplasty implant and graft: Secondary | ICD-10-CM | POA: Diagnosis not present

## 2018-09-16 DIAGNOSIS — N183 Chronic kidney disease, stage 3 (moderate): Secondary | ICD-10-CM | POA: Diagnosis not present

## 2018-09-16 DIAGNOSIS — I2510 Atherosclerotic heart disease of native coronary artery without angina pectoris: Secondary | ICD-10-CM | POA: Insufficient documentation

## 2018-09-16 DIAGNOSIS — I480 Paroxysmal atrial fibrillation: Secondary | ICD-10-CM | POA: Diagnosis not present

## 2018-09-16 DIAGNOSIS — I152 Hypertension secondary to endocrine disorders: Secondary | ICD-10-CM | POA: Diagnosis present

## 2018-09-16 DIAGNOSIS — I714 Abdominal aortic aneurysm, without rupture: Secondary | ICD-10-CM | POA: Diagnosis not present

## 2018-09-16 DIAGNOSIS — M1990 Unspecified osteoarthritis, unspecified site: Secondary | ICD-10-CM | POA: Diagnosis present

## 2018-09-16 DIAGNOSIS — Z79890 Hormone replacement therapy (postmenopausal): Secondary | ICD-10-CM | POA: Diagnosis not present

## 2018-09-16 DIAGNOSIS — N1832 Chronic kidney disease, stage 3b: Secondary | ICD-10-CM | POA: Diagnosis present

## 2018-09-16 DIAGNOSIS — I5032 Chronic diastolic (congestive) heart failure: Secondary | ICD-10-CM | POA: Diagnosis present

## 2018-09-16 LAB — CBC WITH DIFFERENTIAL/PLATELET
BASOS ABS: 0 10*3/uL (ref 0.0–0.1)
Basophils Relative: 0 %
Eosinophils Absolute: 0.2 10*3/uL (ref 0.0–0.7)
Eosinophils Relative: 2 %
HCT: 42 % (ref 39.0–52.0)
HEMOGLOBIN: 13.9 g/dL (ref 13.0–17.0)
Lymphocytes Relative: 26 %
Lymphs Abs: 2.1 10*3/uL (ref 0.7–4.0)
MCH: 32.3 pg (ref 26.0–34.0)
MCHC: 33.1 g/dL (ref 30.0–36.0)
MCV: 97.4 fL (ref 78.0–100.0)
Monocytes Absolute: 0.6 10*3/uL (ref 0.1–1.0)
Monocytes Relative: 8 %
NEUTROS ABS: 5.2 10*3/uL (ref 1.7–7.7)
NEUTROS PCT: 64 %
PLATELETS: 193 10*3/uL (ref 150–400)
RBC: 4.31 MIL/uL (ref 4.22–5.81)
RDW: 13.4 % (ref 11.5–15.5)
WBC: 8.2 10*3/uL (ref 4.0–10.5)

## 2018-09-16 LAB — URINALYSIS, ROUTINE W REFLEX MICROSCOPIC
BILIRUBIN URINE: NEGATIVE
Bacteria, UA: NONE SEEN
Glucose, UA: NEGATIVE mg/dL
HGB URINE DIPSTICK: NEGATIVE
Ketones, ur: NEGATIVE mg/dL
LEUKOCYTES UA: NEGATIVE
Nitrite: NEGATIVE
PH: 5 (ref 5.0–8.0)
Protein, ur: 30 mg/dL — AB
SPECIFIC GRAVITY, URINE: 1.014 (ref 1.005–1.030)

## 2018-09-16 LAB — BASIC METABOLIC PANEL
ANION GAP: 12 (ref 5–15)
BUN: 39 mg/dL — ABNORMAL HIGH (ref 8–23)
CHLORIDE: 100 mmol/L (ref 98–111)
CO2: 30 mmol/L (ref 22–32)
Calcium: 9.7 mg/dL (ref 8.9–10.3)
Creatinine, Ser: 1.69 mg/dL — ABNORMAL HIGH (ref 0.61–1.24)
GFR calc Af Amer: 40 mL/min — ABNORMAL LOW (ref >60)
GFR, EST NON AFRICAN AMERICAN: 35 mL/min — AB (ref >60)
Glucose, Bld: 105 mg/dL — ABNORMAL HIGH (ref 70–99)
POTASSIUM: 3.9 mmol/L (ref 3.5–5.1)
Sodium: 142 mmol/L (ref 135–145)

## 2018-09-16 LAB — I-STAT TROPONIN, ED: Troponin i, poc: 0.01 ng/mL (ref 0.00–0.08)

## 2018-09-16 MED ORDER — TRAMADOL HCL 50 MG PO TABS: 50.0000 mg | ORAL_TABLET | Freq: Four times a day (QID) | ORAL | Status: DC | PRN

## 2018-09-16 MED ORDER — EZETIMIBE 10 MG PO TABS: 10.0000 mg | ORAL_TABLET | Freq: Every day | ORAL | Status: DC

## 2018-09-16 MED ORDER — APIXABAN 2.5 MG PO TABS: 2.5000 mg | ORAL_TABLET | Freq: Two times a day (BID) | ORAL | Status: DC

## 2018-09-16 MED ORDER — METHOCARBAMOL 1000 MG/10ML IJ SOLN
500.0000 mg | Freq: Once | INTRAMUSCULAR | Status: DC
  Filled 2018-09-16: qty 5

## 2018-09-16 MED ORDER — ATORVASTATIN CALCIUM 80 MG PO TABS: 80.0000 mg | ORAL_TABLET | Freq: Every day | ORAL | Status: DC

## 2018-09-16 MED ORDER — TEMAZEPAM 15 MG PO CAPS: 30.0000 mg | ORAL_CAPSULE | Freq: Every evening | ORAL | Status: DC | PRN

## 2018-09-16 MED ORDER — ACETAMINOPHEN 325 MG PO TABS: 650.0000 mg | ORAL_TABLET | Freq: Four times a day (QID) | ORAL | Status: DC | PRN

## 2018-09-16 MED ORDER — OXYCODONE HCL 5 MG PO TABS: 5.0000 mg | ORAL_TABLET | ORAL | 0 refills | Status: AC | PRN

## 2018-09-16 MED ORDER — ONDANSETRON HCL 4 MG PO TABS: 4.0000 mg | ORAL_TABLET | Freq: Four times a day (QID) | ORAL | Status: DC | PRN

## 2018-09-16 MED ORDER — ACETAMINOPHEN 650 MG RE SUPP: 650.0000 mg | Freq: Four times a day (QID) | RECTAL | Status: DC | PRN

## 2018-09-16 MED ORDER — ENOXAPARIN SODIUM 40 MG/0.4ML ~~LOC~~ SOLN: 40.0000 mg | SUBCUTANEOUS | Status: DC

## 2018-09-16 MED ORDER — CARVEDILOL 12.5 MG PO TABS: 12.5000 mg | ORAL_TABLET | Freq: Two times a day (BID) | ORAL | Status: DC

## 2018-09-16 MED ORDER — POTASSIUM CHLORIDE CRYS ER 20 MEQ PO TBCR: 20.0000 meq | EXTENDED_RELEASE_TABLET | Freq: Every day | ORAL | Status: DC

## 2018-09-16 MED ORDER — ISOSORBIDE MONONITRATE ER 30 MG PO TB24: 30.0000 mg | ORAL_TABLET | Freq: Every day | ORAL | Status: DC

## 2018-09-16 MED ORDER — AMIODARONE HCL 100 MG PO TABS: 100.0000 mg | ORAL_TABLET | Freq: Every day | ORAL | Status: DC

## 2018-09-16 MED ORDER — FUROSEMIDE 40 MG PO TABS: 80.0000 mg | ORAL_TABLET | Freq: Two times a day (BID) | ORAL | Status: DC

## 2018-09-16 MED ORDER — HYDRALAZINE HCL 50 MG PO TABS: 50.0000 mg | ORAL_TABLET | Freq: Three times a day (TID) | ORAL | Status: DC

## 2018-09-16 MED ORDER — TIOTROPIUM BROMIDE MONOHYDRATE 18 MCG IN CAPS: 18.0000 ug | ORAL_CAPSULE | Freq: Every day | RESPIRATORY_TRACT | Status: DC

## 2018-09-16 MED ORDER — ACETAMINOPHEN 500 MG PO TABS
1000.0000 mg | ORAL_TABLET | Freq: Once | ORAL | Status: AC
  Administered 2018-09-16: 1000 mg via ORAL
  Filled 2018-09-16: qty 2

## 2018-09-16 MED ORDER — CILOSTAZOL 100 MG PO TABS: 100.0000 mg | ORAL_TABLET | Freq: Two times a day (BID) | ORAL | Status: DC

## 2018-09-16 MED ORDER — MORPHINE SULFATE (PF) 4 MG/ML IV SOLN
6.0000 mg | Freq: Once | INTRAVENOUS | Status: AC
  Administered 2018-09-16: 6 mg via INTRAVENOUS
  Filled 2018-09-16: qty 2

## 2018-09-16 MED ORDER — ONE-DAILY MULTI VITAMINS PO TABS: 1.0000 | ORAL_TABLET | Freq: Every day | ORAL | Status: DC

## 2018-09-16 MED ORDER — LEVOTHYROXINE SODIUM 125 MCG PO TABS: 125.0000 ug | ORAL_TABLET | Freq: Every day | ORAL | Status: DC

## 2018-09-16 MED ORDER — ONDANSETRON HCL 4 MG/2ML IJ SOLN: 4.0000 mg | Freq: Four times a day (QID) | INTRAMUSCULAR | Status: DC | PRN

## 2018-09-16 MED ORDER — HYDROMORPHONE HCL 1 MG/ML IJ SOLN
1.0000 mg | Freq: Once | INTRAMUSCULAR | Status: AC
  Administered 2018-09-16: 1 mg via INTRAMUSCULAR
  Filled 2018-09-16: qty 1

## 2018-09-16 NOTE — Progress Notes (Signed)
Patient ID: NOEMI ISHMAEL, male   DOB: 03/27/1928, 84 y.o.   MRN: [id number] PCP: Dr. Jerline Pain Cardiology: Dr. Christella Noa is a 84 y.o. male with history of CKD, CAD s/p CABG, and ischemic cardiomyopathy with primarily diastolic CHF presents for followup of CHF and CAD.  He has a history of PAD followed at [facility].  I took him for [facility] in 11/15. 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  He had had occlusion of a relatively small LAD, there were right to left collaterals and no intervention was done (medical management).   He and his wife went on a cruise along the [employer] in 5/16.  He admits to considerable dietary indiscretion (high sodium diet).  It appears that he developed a hypertensive crisis along with chest pain while on the ship. He went into atrial fibrillation and developed CHF.  He was taken to the hospital in [place], [place].  He was in the ICU for about a week on Bipap intermittently.  Troponin peaked at 0.09 during this admission.  He was diuresed and after about 2 wks left the hospital and was able to fly home.    Cardiolite (6/16) showed EF 42%, prior anterolateral MI, no ischemia. Echo in 10/16 with EF 40-45%.    He was admitted on 07/06/15 for RLQ pain and fever.  He was treated for UTI and had a kidney stone as well.  He developed SOB after IVF were given for AKI.  He was treated for acute COPD exacerbation as well as CHF decompensation.  He was diuresed with IV lasix.  His weight on admission was 179 and got as high as 188 after IVF. Discharge weight was 181.  He was admitted in 10/16 with a cerebellar CVA.  He has some resultant imbalance.   He was again admitted in 11/16 with acute on chronic systolic CHF.  This was a short admission that appeared to be precipitated by a sodium load from eating at a [employer] .   3/18 Echo showed stable EF 40-45%.  He was also having chest pain so I did a Cardiolite in 3/18, no ischemia.   He was admitted in 11/18  for elective hernia repair. Post-op, he developed hypoxemia and had a rehab stay.  He is now home and off oxygen.   Creatinine increased to 2.97 in 1/19.  I cut back on losartan and Lasix.  He developed increased lower extremity edema and we increased Lasix back to 120 qam/80 qpm, later increased to 120 bid.   He has been doing well recently from a cardiac standpoint, but he has been having a lot of trouble with low back pain for a couple of weeks.  He has not been moving much and weight is up. No significant dyspnea though not very active.  Claudication seems to have improved in recent months. No chest pain.  No orthopnea/PND. Paramedicine has helped a lot with medication compliance.  BP is high today.  He is not taking atorvastatin, not sure why not.   Labs (12/14): K 4.8, creatinine 1.5 Labs (5/15): LDL 92 Labs (11/15): K 4.1, creatinine 1.5 Labs (12/15): K 4, creatinine 1.5 Labs (1/16): LFTs normal, LDL 51, HDL 47 Labs (5/16): TnI 0.09 Labs (6/16): K 4.8 => 4.4, creatinine 1.79 => 2.26, HCT 28.7 Labs (7/16) K 4.2, creatinine 1.98, LFTs normal, TSH normal Labs (8/16): HCT 27.1 Labs (9/16): hgb 8.1, K 4.3, creatinine 1.8, LFTs normal, TSH elevated  Labs (10/16): LDL 74, HDL 45, TSH 5.19 (increased), free T3 and free T4 normal.  Labs (11/16): K 3.5, creatinine 1.47, AST 84, ALT 89 Labs (12/16): K 4.3, creatinine 1.58, AST 43, ALT 42 Labs (3/17): K 4.2, creatinine 1.4, hgb 10.7, AST 36, ALT 45 Labs (6/17): K 4, creatinine 1.69, hgb 10.9, proBNP 389, TSH mildly elevated Labs (8/17): Free T4 and T3 normal, TSH mildly elevated, K 4.2, creatinine 1.7, LFTs normal, hgb 10.4 Labs (12/17): TSH elevated but free T3 and T4 normal, K 4.5, creatinine 1.9, LDL 45, HDL 39, LFTs normal, HCT 32.9 Labs (1/18): K 4.4, creatinine 1.93 Labs (3/18): K 4.3, creatinine 1.87, BNP 187, hgb 11.1, LDL 75, LFTs normal Labs (4/18): K 4.1, creatinine 1.7 Labs (9/18): K 4, creatinine 1.8, hgb 11.9 Labs (10/18): free T4  0.4, free T3 1.9, TSH 139  Labs (12/18): K 3.4, creatinine 1.76, hgb 12.2 Labs (1/19): K 4 => 4.9, creatinine 2.7 => 2.97, TSH 179, LFTs normal Labs (2/19): K 4.6, creatinine 2.19 Labs (4/19): LDL 61, HDL 36, K 3.9, creatinine 1.77, LFTs normal  Labs (6/19): hgb 11.7, K 3.8, creatinine 2.09, TSH 10  PMH: 1. CKD stage 3 2. HTN 3. Hyperlipidemia 4. AAA: s/p surgery with left renal artery bypass in 1997 - Abdominal US 10/18 with 3.2 AAA 5. GERD 6. IBS 7. Melanoma s/p reception 8. Diabetes: Diet-controlled 9. Carotid stenosis: Carotid dopplers (4/15) with 40-59% bilateral stenosis. Carotid dopplers (7/16) with < 40% BICA stenosis.  Carotid dopplers (10/16) with 40-59% BICA stenosis.  - Carotids (8/17) with minimal disease.  - Carotids (10/18) with 1-39% BICA stenosis.  - Carotids (1/19): 1-39% BICA stenosis 10. PAD: 9/14 ABIs 0.91 right 1.09 left.  7/16 ABIs 0.86 right, 1.1 left; aortoiliac duplex with < 50% bilateral iliac stenosis.  - Aortoiliac duplex (8/17) with < 50% bilateral iliac stenosis.   - ABIs (10/17): left 0.91, right 0.98, left TBI 0.44 (abnormal).  - ABIs (7/18): right 0.97, right 0.75 - ABIs (1/19): right 1.04, left 1.03 - ABIs (7/19): right 1.13, noncompressible left ABI but TBI 0.93 11. CAD: Has super-dominant RCA. s/p CABG 1992.  He had Taxus DES to mid PDA in 5/02. LHC (1/05) with 90% ostial PDA (had DES to this vessel), totally occluded RIMA-PDA, totally occluded PLV, sequential SVG-PLV with 1 branch occluded.  LHC (11/15) with left main off the right cusp, 40-50% ostial left main, total occlusion of the proximal LAD (small vessel) with right to left collaterals, large super-dominant RCA with 50% ISR mid RCA, 50% ostial PDA, patent SVG-PLV, RIMA-PDA known atretic. Lexiscan Cardiolite (6/16) with EF 42%, prior anterolateral MI, no ischemia.  - Lexiscan Cardiolite (3/18): EF 51%, basal to mid anterolateral fixed defect with no ischemia.  12. Ischemic cardiomyopathy: Echo  (12/09) with EF 45-50%, basal to mid inferolateral hypokinesis. Echo (6/15) with EF 45-50%, akinesis of the mid to apical inferolateral wall.  Echo (6/16) with EF 50-55%, mild LVH, inferior hypokinesis, mild MR.  Echo (10/16) with EF 40-45%, moderate LVH, mildly decreased RV systolic function.  - Echo (3/18) with EF 40-45%, mid anterior hypokinesis.  - Echo (2/19) with EF 60-65%, mild LVH, trade II diastolic dysfunction.  13. OA 14. Atrial fibrillation/flutter: Paroxysmal.  Noted during 5/16 hospitalization in [place] and in 6/16.  He developed atrial flutter in 8/16, back in NSR by 9/16.  15. Emphysema: PFTs (8/16) with FVC 88%, FEV1 74%, ratio 81%, TLC 83%, DLCO 31%. - PFTs (12/18): FVC 64%, FEV1 69%, ratio 105%, TLC  82%, DLCO 33% => mild obstruction, severely decreased DLCO (similar to prior).   16. Renal cysts 17. Idiopathic peripheral neuropathy.  18. Anemia.  12. 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  20. Hernia repair (11/18) 21. Hypothyroidism 22. Bronchiectasis: CT chest 1/19 with bronchiectasis.  23. Lung nodule: 1.3 cm RML nodule on 1/19 CT, was noted in 2014 => slow growing. Possibly malignant.  Decision made with pulmonary to watch and avoid biopsy.  24. Suspect chronic aspiration with bronchiectasis.   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  FH: Brother with CAD.   Review of systems complete and found to be negative unless listed in HPI.    No current facility-administered medications for this encounter.    Current Outpatient Medications  Medication Sig Dispense Refill  . amiodarone (PACERONE) 200 MG tablet Take 0.5 tablets (100 mg total) by mouth daily. 45 tablet 3  . carvedilol (COREG) 12.5 MG tablet TAKE 1 TABLET BY MOUTH TWICE DAILY. 180 tablet 3  . cilostazol (PLETAL) 100 MG tablet TAKE 1 TABLET BY MOUTH TWICE DAILY. 180 tablet 3  . ELIQUIS 2.5 MG TABS tablet TAKE 1 TABLET BY MOUTH TWICE DAILY. 60 tablet 11  . ezetimibe  (ZETIA) 10 MG tablet TAKE (1) TABLET DAILY. 90 tablet 0  . furosemide (LASIX) 80 MG tablet Take 1.5 tablets (120 mg total) by mouth every morning AND 1 tablet (80 mg total) every evening. 75 tablet 6  . glucose blood (ACCU-CHEK AVIVA PLUS) test strip CHECK BLOOD SUGAR 4 TIMES A DAY. 100 each 11  . hydrALAZINE (APRESOLINE) 50 MG tablet Take 1 tablet (50 mg total) by mouth 3 (three) times daily. 90 tablet 3  . Lancets MISC Check blood sugar 4 times a day 100 each 11  . levothyroxine (SYNTHROID, LEVOTHROID) 125 MCG tablet Take 1 tablet (125 mcg total) by mouth daily. 90 tablet 1  . Multiple Vitamin (MULTIVITAMIN) tablet Take 1 tablet by mouth daily.    . Potassium Chloride ER 20 MEQ TBCR Take 20 mEq by mouth daily. 30 tablet 3  . potassium chloride SA (K-DUR,KLOR-CON) 20 MEQ tablet Take 1 tablet (20 mEq total) by mouth daily. 90 tablet 3  . temazepam (RESTORIL) 30 MG capsule TAKE 1 CAPSULE AT BEDTIME AS NEEDED FOR SLEEP. 30 capsule 0  . tiotropium (SPIRIVA HANDIHALER) 18 MCG inhalation capsule Place 1 capsule (18 mcg total) into inhaler and inhale daily. 30 capsule 12  . atorvastatin (LIPITOR) 80 MG tablet Take 1 tablet (80 mg total) by mouth daily. 30 tablet 6  . isosorbide mononitrate (IMDUR) 30 MG 24 hr tablet Take 1 tablet (30 mg total) by mouth daily. 30 tablet 6  . NITROSTAT 0.4 MG SL tablet DISSOLVE 1 TABLET UNDER TONGUE AS NEEDED FOR CHEST PAIN,MAY REPEAT IN5 MINUTES FOR 2 DOSES. 25 tablet 6  . traMADol (ULTRAM) 50 MG tablet Take 1 tablet (50 mg total) by mouth every 6 (six) hours as needed. (Patient not taking: Reported on 07/12/2018) 12 tablet 0   Facility-Administered Medications Ordered in Other Encounters  Medication Dose Route Frequency Provider Last Rate Last Dose  . acetaminophen (TYLENOL) tablet 650 mg  650 mg Oral Q6H PRN Etta Quill, DO       Or  . acetaminophen (TYLENOL) suppository 650 mg  650 mg Rectal Q6H PRN Etta Quill, DO      . [START ON 09/17/2018] amiodarone  (PACERONE) tablet 100 mg  100 mg Oral Daily Etta Quill, DO      .  apixaban (ELIQUIS) tablet 2.5 mg  2.5 mg Oral BID Etta Quill, DO      . [START ON 09/17/2018] atorvastatin (LIPITOR) tablet 80 mg  80 mg Oral Daily Alcario Drought, Jared M, DO      . carvedilol (COREG) tablet 12.5 mg  12.5 mg Oral BID Jennette Kettle M, DO      . cilostazol (PLETAL) tablet 100 mg  100 mg Oral BID Etta Quill, DO      . [START ON 09/17/2018] ezetimibe (ZETIA) tablet 10 mg  10 mg Oral Daily Etta Quill, DO      . [START ON 09/17/2018] furosemide (LASIX) tablet 80-120 mg  80-120 mg Oral BID Jennette Kettle M, DO      . hydrALAZINE (APRESOLINE) tablet 50 mg  50 mg Oral TID Etta Quill, DO      . [START ON 09/17/2018] isosorbide mononitrate (IMDUR) 24 hr tablet 30 mg  30 mg Oral Daily Etta Quill, DO      . [START ON 09/17/2018] levothyroxine (SYNTHROID, LEVOTHROID) tablet 125 mcg  125 mcg Oral Daily Alcario Drought, Jared M, DO      . methocarbamol (ROBAXIN) injection 500 mg  500 mg Intramuscular Once Kinnie Feil, PA-C   Stopped at 09/16/18 1759  . [START ON 09/17/2018] multivitamin tablet 1 tablet  1 tablet Oral Daily Jennette Kettle M, DO      . ondansetron [facility]) tablet 4 mg  4 mg Oral Q6H PRN Etta Quill, DO       Or  . ondansetron [facility]) injection 4 mg  4 mg Intravenous Q6H PRN Etta Quill, DO      . [START ON 09/17/2018] potassium chloride SA (K-DUR,KLOR-CON) CR tablet 20 mEq  20 mEq Oral Daily Alcario Drought, Jared M, DO      . temazepam (RESTORIL) capsule 30 mg  30 mg Oral QHS PRN Etta Quill, DO      . [START ON 09/17/2018] tiotropium (SPIRIVA) inhalation capsule 18 mcg  18 mcg Inhalation Daily Etta Quill, DO       Vitals:   09/14/18 1457  BP: (!) 170/92  Pulse: 67  SpO2: 96%  Weight: 88.5 kg (195 lb 3.2 oz)   Wt Readings from Last 3 Encounters:  09/14/18 88.5 kg (195 lb 3.2 oz)  09/13/18 88.5 kg (195 lb)  09/06/18 87.6 kg (193 lb 3.2 oz)    Physical Exam General:  NAD Neck: No JVD, no thyromegaly or thyroid nodule.  Lungs: Clear to auscultation bilaterally with normal respiratory effort. CV: Nondisplaced PMI.  Heart regular S1/S2, no S3/S4, no murmur.  No peripheral edema.  No carotid bruit.  Unable to palpate pedal pulses.  Abdomen: Soft, nontender, no hepatosplenomegaly, no distention.  Skin: Intact without lesions or rashes.  Neurologic: Alert and oriented x 3.  Psych: Normal affect. Extremities: No clubbing or cyanosis.  HEENT: Normal.   Assessment/Plan:  1. CAD: s/p CABG.  Cath in 11/15 showed occluded LAD with left to right collaterals.  The LAD was a relatively small vessel (super-dominant right).  Managed medically. Lexiscan Cardiolite in 6/16 with infarction but no ischemia.  Cardiolite 3/18 with infarction, no ischemia. No chest pain.  - Continue ASA and statin.  2. Ischemic cardiomyopathy with systolic CHF: EF 50-27% on 3/18 echo.  Echo 2/19 with LVEF improved to 60-65%. NYHA II-III chronically. He is not volume overloaded on exam. Weight is up but he has been fairly immobile the last couple of weeks with  back pain.  He is off losartan with elevated creatinine.  - Continue Lasix 120 qam/80 qpm.  Will get BMET.  - With elevated BP, increase hydralazine to 50 mg tid.   - Start Imdur 30 mg daily now that he is off [place] study drug.   - Continue Coreg 12.5 mg bid.    3. Carotid stenosis: Stable. Followed at [facility].  4. PAD: Improved claudication, ABIs have normalized.  No pedal ulcers.   - Follows at [facility]. Plan for medical management.  - Continue cilostazol, unable to come off due to worsening claudication when he stops it.  5. Hyperlipidemia: Good lipids in 4/19, restart atorvastatin 80 mg daily (not sure why he is off it).   6. CKD III:  BMET today. Off losartan.  7. Atrial fibrillation/flutter:  Paroxysmal.  NSR today.  - Continue amiodarone 100 mg daily. Will need to get regular eye exams with amiodarone use.  High resolution CT in 1/19 did  not appear to show evidence for amiodarone lung toxicity. Check LFTs today, TSH followed by PCP (hypothyroidism).  - Continue Eliquis 2.5 bid.   8. Suspected OSA:  Cancelled sleep study due to mask discomfort.  9. COPD: He is now off oxygen. He has bronchiectasis thought to be due to chronic aspiration. - Follows with Dr. Lake Bells.  10. CVA: Cerebellar, 10/16.  He has had some resulting imbalance.  - He is no longer driving.  11. Hypertension: Increasing hydralazine to 50 mg tid.   12. Hypothyroidism: Likely related to amiodarone. -  Improving under PCP's management.  13. Pulmonary nodule: 1.3 cm RML nodule on 1/19 CT chest.  It was also seen in 2014 => slow growing.  Dr Lake Bells has discussed with wife and they have decided not to go forward with biopsy. They would not be interested in lung cancer treatment.   Followup in 3 months.   Loralie Champagne, MD  09/16/2018

## 2018-09-16 NOTE — Plan of Care (Addendum)
Patient with asymptomatic hypoxia with activity noted during ED visit for unrelated issue.  Suspect this to be chronic hypoxia given his known history of ILD with elevated DLCO (see also Dr. Anastasia Pall office note from a couple of months ago).  Planned on admitting on O2 overnight to try and set up home O2, but pt and wife declining admission.

## 2018-09-16 NOTE — ED Triage Notes (Signed)
Pt reports he has an abscess on his abdomen near the right side of his abdomen. Pt reports he has back pain as well in the lumbar region.

## 2018-09-16 NOTE — ED Provider Notes (Addendum)
[facility] DEPT Provider Note   CSN: [id number] Arrival date & time: 09/16/18  1148     History   Chief Complaint Chief Complaint  Patient presents with  . Back Pain  . Abscess    HPI Andrew [name] is a 84 y.o. male with complex past medical history is here for evaluation of right groin pain.  Onset 1 year ago but gradually worsening, severe, initially intermittent but now has become more continuous.  Pain is exacerbated with any movement of the right hip.  Specifically, patient states the pain is most severe when he sneezes.  Additionally notes he has had diffuse low back pain for several years that is unchanged, sometimes he feels like the right groin pain radiates to his back.  He has been taking over-the-counter NSAIDs and applying creams without relief.  Describes the pain as sharp.  He denies any falls or recent trauma, previous surgeries or procedures to his back or hips.  He denies abdominal pain, dysuria, scrotal swelling or pain, saddle anesthesia, weakness paresthesias or loss of sensation to his extremities.  HPI  Past Medical History:  Diagnosis Date  . AAA (abdominal aortic aneurysm) ([place])   . Anemia   . Arthritis   . CAD (coronary artery disease)   . Cancer ([place])    Melanoma - Back  . Carotid artery stenosis   . CHF (congestive heart failure) ([place])   . Cyst of kidney, acquired   . Depression   . Diabetes mellitus   . DVT (deep venous thrombosis) ([place])   . Dysrhythmia   . Fatty liver 2008  . GERD (gastroesophageal reflux disease)   . Hyperlipidemia   . Hypertension   . IBS (irritable bowel syndrome)   . LVF (left ventricular failure) ([place])   . Paroxysmal atrial fibrillation ([facility])   . Pneumonia Jan. 2014  . Shortness of breath dyspnea   . Thyroid disease   . Typical atrial flutter ([place])   . UTI (urinary tract infection) 05/2015   While in [place]   . Vitamin D deficiency     Patient Active Problem List   Diagnosis Date  Noted  . Chronic respiratory failure with hypoxia ([place]) 09/16/2018  . Bilateral recurrent inguinal hernias 11/28/2017  . Calf pain 05/25/2016  . Carotid artery stenosis 01/12/2016  . Chronic systolic CHF (congestive heart failure) ([place]) 12/02/2015  . Cerebellar infarct ([place]) 10/16/2015  . 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 ([place]) 10/16/2015  . PVD (peripheral vascular disease) ([place])   . Hereditary and idiopathic peripheral neuropathy 10/02/2015  . Peripelvic (lymphatic) cyst   . Pernicious anemia 07/15/2015  . Bilateral renal cysts 07/12/2015  . Lung nodule, 4mm RML CT 07/12/15 07/12/2015  . Constipation 07/12/2015  . Kidney stone on right side 07/12/2015  . Stage 3 chronic kidney disease ([place]) 06/14/2015  . Chronic diastolic CHF (congestive heart failure) ([place]) 05/28/2015  . Paroxysmal atrial fibrillation ([place]) 05/28/2015  . AAA (abdominal aortic aneurysm) without rupture ([place]) 09/30/2014  . Left-sided low back pain without sciatica 07/21/2014  . Cardiomyopathy, ischemic 05/23/2014  . DM (diabetes mellitus), type 2 with renal complications ([place]) 70/62/3762  . CORONARY ATHEROSCLEROSIS NATIVE CORONARY ARTERY 11/09/2010  . Depression, major, single episode, complete remission ([place]) 03/18/2010  . Hypothyroidism 02/03/2009  . Osteoarthritis 05/27/2008  . Hyperlipidemia associated with type 2 diabetes mellitus ([place]) 06/14/2007  . Hypertension associated with diabetes ([place]) 06/14/2007  . GERD 06/14/2007    Past Surgical History:  Procedure Laterality  Date  . ABDOMINAL AORTIC ANEURYSM REPAIR  2002  . CARDIAC CATHETERIZATION    . CORONARY ANGIOPLASTY    . CORONARY ANGIOPLASTY WITH STENT PLACEMENT  2005  . CORONARY ARTERY BYPASS GRAFT  1992  . EYE SURGERY     [place]  . HERNIA REPAIR    . INGUINAL HERNIA REPAIR Bilateral    w/mesh  . INSERTION OF MESH N/A 11/28/2017   Procedure: INSERTION OF MESH;  Surgeon: Donnie Mesa, MD;  Location: [facility];  Service: General;   Laterality: N/A;  GENERAL AND TAP BLOCK  . KNEE SURGERY Bilateral   . LAPAROSCOPIC INGUINAL HERNIA WITH UMBILICAL HERNIA Bilateral 11/28/2017   Procedure: LAPAROSCOPIC BILATERAL INGUINAL HERNIA REPAIR WITH MESH, UMBILICAL HERNIA REPAIR WITH MESH;  Surgeon: Donnie Mesa, MD;  Location: [facility];  Service: General;  Laterality: Bilateral;  GENERAL AND TAP BLOCK  . LEFT HEART CATHETERIZATION WITH CORONARY ANGIOGRAM N/A 10/30/2014   Procedure: LEFT HEART CATHETERIZATION WITH CORONARY ANGIOGRAM;  Surgeon: Larey Dresser, MD;  Location: [facility] CATH LAB;  Service: Cardiovascular;  Laterality: N/A;  . QUADRICEPS TENDON REPAIR Bilateral 08/2003   Archie Endo 05/10/2011  . UMBILICAL HERNIA REPAIR  11/28/2017   w/mesh        Home Medications    Prior to Admission medications   Medication Sig Start Date End Date Taking? Authorizing Provider  amiodarone (PACERONE) 200 MG tablet Take 0.5 tablets (100 mg total) by mouth daily. 07/26/18  Yes Larey Dresser, MD  atorvastatin (LIPITOR) 80 MG tablet Take 1 tablet (80 mg total) by mouth daily. 09/14/18 12/13/18 Yes Larey Dresser, MD  carvedilol (COREG) 12.5 MG tablet TAKE 1 TABLET BY MOUTH TWICE DAILY. 07/26/18  Yes Larey Dresser, MD  cilostazol (PLETAL) 100 MG tablet TAKE 1 TABLET BY MOUTH TWICE DAILY. 07/16/18  Yes Larey Dresser, MD  ELIQUIS 2.5 MG TABS tablet TAKE 1 TABLET BY MOUTH TWICE DAILY. 02/07/18  Yes Larey Dresser, MD  ezetimibe (ZETIA) 10 MG tablet TAKE (1) TABLET DAILY. 07/26/18  Yes [name], MD  furosemide (LASIX) 80 MG tablet Take 1.5 tablets (120 mg total) by mouth every morning AND 1 tablet (80 mg total) every evening. 06/14/18  Yes Larey Dresser, MD  glucose blood (ACCU-CHEK AVIVA PLUS) test strip CHECK BLOOD SUGAR 4 TIMES A DAY. 02/19/18  Yes [name], MD  hydrALAZINE (APRESOLINE) 50 MG tablet Take 1 tablet (50 mg total) by mouth 3 (three) times daily. 09/14/18 09/14/19 Yes Larey Dresser, MD  isosorbide mononitrate (IMDUR) 30 MG  24 hr tablet Take 1 tablet (30 mg total) by mouth daily. 09/14/18 12/13/18 Yes Larey Dresser, MD  Lancets MISC Check blood sugar 4 times a day 02/19/18  Yes [name], MD  levothyroxine (SYNTHROID, LEVOTHROID) 125 MCG tablet Take 1 tablet (125 mcg total) by mouth daily. 06/08/18  Yes [name], MD  Multiple Vitamin (MULTIVITAMIN) tablet Take 1 tablet by mouth daily.   Yes [provider]  NITROSTAT 0.4 MG SL tablet DISSOLVE 1 TABLET UNDER TONGUE AS NEEDED FOR CHEST PAIN,MAY REPEAT IN5 MINUTES FOR 2 DOSES. 07/21/17  Yes Larey Dresser, MD  potassium chloride SA (K-DUR,KLOR-CON) 20 MEQ tablet Take 1 tablet (20 mEq total) by mouth daily. 07/26/18  Yes Larey Dresser, MD  tiotropium (SPIRIVA HANDIHALER) 18 MCG inhalation capsule Place 1 capsule (18 mcg total) into inhaler and inhale daily. 12/14/17  Yes Larey Dresser, MD  oxyCODONE (ROXICODONE) 5 MG immediate release  tablet Take 1 tablet (5 mg total) by mouth every 4 (four) hours as needed for up to 2 days for severe pain. 09/16/18 09/18/18  Kinnie Feil, PA-C  Potassium Chloride ER 20 MEQ TBCR Take 20 mEq by mouth daily. 06/07/18 09/14/18  Bensimhon, Shaune Pascal, MD  temazepam (RESTORIL) 30 MG capsule TAKE 1 CAPSULE AT BEDTIME AS NEEDED FOR SLEEP. 02/13/18   [name], MD  traMADol (ULTRAM) 50 MG tablet Take 1 tablet (50 mg total) by mouth every 6 (six) hours as needed. Patient not taking: Reported on 07/12/2018 09/11/17   Rosemarie Ax, MD    Family History Family History  Problem Relation Age of Onset  . Colon cancer Father   . Coronary artery disease Brother   . Diabetes Brother   . Heart disease Brother   . Hyperlipidemia Brother   . Peripheral vascular disease Brother        Varicose Veins  . Throat cancer Brother        abdominal cancer?   . Diabetes Son   . Hyperlipidemia Son     Social History Social History   Tobacco Use  . Smoking status: Former Smoker    Packs/day: 1.50    Years: 35.00     Pack years: 52.50    Types: Cigarettes  . Smokeless tobacco: Never Used  . Tobacco comment: quit atleast 25 yrs ago, per pt  Substance Use Topics  . Alcohol use: Yes    Alcohol/week: 3.0 standard drinks    Types: 3 Shots of liquor per week    Comment: socially  . Drug use: No     Allergies   Metoprolol; Metformin; and Metformin and related   Review of Systems Review of Systems  Musculoskeletal: Positive for arthralgias, back pain and gait problem.  All other systems reviewed and are negative.    Physical Exam Updated Vital Signs BP (!) 179/93 (BP Location: Right Arm)   Pulse 80   Temp 97.9 F (36.6 C) (Oral)   Resp 18   SpO2 91%   Physical Exam  Constitutional: He is oriented to person, place, and time. He appears well-developed and well-nourished. No distress.  Pleasant, no distress.   HENT:  Head: Normocephalic and atraumatic.  Right Ear: External ear normal.  Left Ear: External ear normal.  Nose: Nose normal.  Eyes: Conjunctivae and EOM are normal.  Neck: Normal range of motion. Neck supple.  Cardiovascular: Normal rate, regular rhythm and normal heart sounds.  1+ radial and DP pulses bilaterally  Pulmonary/Chest: Effort normal and breath sounds normal.  Abdominal: Soft. There is no tenderness.  Genitourinary:  Genitourinary Comments: Skin throughout inguinal folds with moist, erythematous malodorous patches with satellite lesions bilaterally, non tender. Femoral pulses 1+ symmetric bilaterally. No inguinal bulging. No testicular edema or tenderness.   Musculoskeletal: Normal range of motion. He exhibits tenderness. He exhibits no deformity.  Pelvis: no AP/L instability with compression, no leg shortening or rotation.  Minimal tenderness to right inguinal fold. Moderate tenderness to anterior and lateral right hip. Pain with passive IR/ER of right hip, no crepitus.  Decreased hip flexion secondary to pain.   L-spine: no midline tenderness. Diffuse paraspinal  tenderness, bilateral SI joint tenderness. Pt has significant pain with movement/rolling in bed.   Neurological: He is alert and oriented to person, place, and time.  Sensation to light touch intact in lower extremities. 5/5 strength with ankle dorsiflexion/plantar flexion and hip abd/add and F/E.   Skin: Skin is warm  and dry. Capillary refill takes less than 2 seconds.  Purple discoloration to lower legs, dry skin throughout legs.   Psychiatric: He has a normal mood and affect. His behavior is normal. Judgment and thought content normal.  Nursing note and vitals reviewed.    ED Treatments / Results  Labs (all labs ordered are listed, but only abnormal results are displayed) Labs Reviewed  BASIC METABOLIC PANEL - Abnormal; Notable for the following components:      Result Value   Glucose, Bld 105 (*)    BUN 39 (*)    Creatinine, Ser 1.69 (*)    GFR calc non Af Amer 35 (*)    GFR calc Af Amer 40 (*)    All other components within normal limits  URINALYSIS, ROUTINE W REFLEX MICROSCOPIC - Abnormal; Notable for the following components:   Protein, ur 30 (*)    All other components within normal limits  CBC WITH DIFFERENTIAL/PLATELET  I-STAT TROPONIN, ED    EKG EKG Interpretation  Date/Time:  Sunday September 16 2018 20:22:21 EDT Ventricular Rate:  71 PR Interval:    QRS Duration: 160 QT Interval:  474 QTC Calculation: 516 R Axis:   -66 Text Interpretation:  Sinus or ectopic atrial rhythm Prolonged PR interval RBBB and LAFB Baseline wander in lead(s) V5 Artifact Confirmed by Fredia Sorrow [phone]) on 09/16/2018 8:40:09 PM   Radiology Dg Chest 2 View  Result Date: 09/16/2018 CLINICAL DATA:  Hypoxia on ambulation EXAM: CHEST - 2 VIEW COMPARISON:  11/12/2015 chest radiograph. FINDINGS: Intact sternotomy wires. Stable cardiomediastinal silhouette with normal heart size. No pneumothorax. No pleural effusion. No pulmonary edema. No acute consolidative airspace disease. IMPRESSION:  No active cardiopulmonary disease. Electronically Signed   By: Ilona Sorrel M.D.   On: 09/16/2018 20:44   Dg Lumbar Spine Complete  Result Date: 09/16/2018 CLINICAL DATA:  Pt reported he has an abscess on his abdomen near the right side of his abdomen and lower back pain. EXAM: LUMBAR SPINE - COMPLETE 4+ VIEW COMPARISON:  CT 08/27/2017 FINDINGS: L5 pars defects with L5-S1 anterolisthesis as before. Extensive bridging osteophytes throughout the visualized lower thoracic and lumbar spine. No acute fracture. Mild narrowing of the L2-3, L4-5, and L5-S1 interspaces as before. Aortoiliac atheromatous calcifications. Surgical clips in the left mid abdomen. Bilateral hip DJD left greater than right. IMPRESSION: 1.  Negative for fracture or other acute bone abnormality. 2. Multilevel degenerative disc disease with stable L5-S1 anterolisthesis. Electronically Signed   By: Lucrezia Europe M.D.   On: 09/16/2018 16:42   Dg Hip Unilat W Or Wo Pelvis 2-3 Views Right  Result Date: 09/16/2018 CLINICAL DATA:  Pt reported he has an abscess on his abdomen near the right side of his abdomen and lower back pain. EXAM: DG HIP (WITH OR WITHOUT PELVIS) 2-3V RIGHT COMPARISON:  CT 08/27/2017 FINDINGS: No fracture or dislocation. Extensive iliofemoral arterial calcifications. Bilateral hip DJD left worse than right. Spondylitic changes in the visualized lower lumbar spine. Extensive pelvic enthesopathy. Bilateral pelvic phleboliths. IMPRESSION: 1. No fracture or dislocation. 2. Chronic and degenerative changes as above Electronically Signed   By: Lucrezia Europe M.D.   On: 09/16/2018 16:45    Procedures .Critical Care Performed by: Kinnie Feil, PA-C Authorized by: Kinnie Feil, PA-C   Critical care provider statement:    Critical care time (minutes):  45   Critical care was necessary to treat or prevent imminent or life-threatening deterioration of the following conditions:  Respiratory failure  Critical care was time  spent personally by me on the following activities:  Discussions with consultants, evaluation of patient's response to treatment, examination of patient, ordering and performing treatments and interventions, ordering and review of laboratory studies, ordering and review of radiographic studies, pulse oximetry, re-evaluation of patient's condition, obtaining history from patient or surrogate and review of old charts   I assumed direction of critical care for this patient from another provider in my specialty: no     (including critical care time)  Medications Ordered in ED Medications  methocarbamol (ROBAXIN) injection 500 mg (500 mg Intramuscular Refused 09/16/18 2016)  acetaminophen (TYLENOL) tablet 650 mg (has no administration in time range)    Or  acetaminophen (TYLENOL) suppository 650 mg (has no administration in time range)  ondansetron (ZOFRAN) tablet 4 mg (has no administration in time range)    Or  ondansetron (ZOFRAN) injection 4 mg (has no administration in time range)  amiodarone (PACERONE) tablet 100 mg (has no administration in time range)  atorvastatin (LIPITOR) tablet 80 mg (has no administration in time range)  cilostazol (PLETAL) tablet 100 mg (has no administration in time range)  apixaban (ELIQUIS) tablet 2.5 mg (has no administration in time range)  ezetimibe (ZETIA) tablet 10 mg (has no administration in time range)  furosemide (LASIX) tablet 80-120 mg (has no administration in time range)  hydrALAZINE (APRESOLINE) tablet 50 mg (has no administration in time range)  isosorbide mononitrate (IMDUR) 24 hr tablet 30 mg (has no administration in time range)  levothyroxine (SYNTHROID, LEVOTHROID) tablet 125 mcg (has no administration in time range)  tiotropium (SPIRIVA) inhalation capsule 18 mcg (has no administration in time range)  temazepam (RESTORIL) capsule 30 mg (has no administration in time range)  potassium chloride SA (K-DUR,KLOR-CON) CR tablet 20 mEq (has no  administration in time range)  multivitamin tablet 1 tablet (has no administration in time range)  carvedilol (COREG) tablet 12.5 mg (has no administration in time range)  HYDROmorphone (DILAUDID) injection 1 mg (1 mg Intramuscular Given 09/16/18 1506)  morphine 4 MG/ML injection 6 mg (6 mg Intravenous Given 09/16/18 1713)  acetaminophen (TYLENOL) tablet 1,000 mg (1,000 mg Oral Given 09/16/18 1720)     Initial Impression / Assessment and Plan / ED Course  I have reviewed the triage vital signs and the nursing notes.  Pertinent labs & imaging results that were available during my care of the patient were reviewed by me and considered in my medical decision making (see chart for details).  Clinical Course as of Sep 16 2334  Nancy Fetter Sep 16, 2018  1648 IMPRESSION: 1. Negative for fracture or other acute bone abnormality. 2. Multilevel degenerative disc disease with stable L5-S1 anterolisthesis.  DG Lumbar Spine Complete [CG]  1648 FINDINGS: No fracture or dislocation. Extensive iliofemoral arterial calcifications. Bilateral hip DJD left worse than right. Spondylitic changes in the visualized lower lumbar spine. Extensive pelvic enthesopathy. Bilateral pelvic phleboliths.  IMPRESSION: 1. No fracture or dislocation. 2. Chronic and degenerative changes as above  DG Hip Unilat W or Wo Pelvis 2-3 Views Right [CG]  1824 Creatinine(!): 1.69 [CG]  1824 GFR, Est Non African American(!): 35 [CG]  1824 RN notified me MRI tech found out pt has an implanted hearing aid and cannot obtain MRI.     [CG]    Clinical Course User Index [CG] Kinnie Feil, PA-C    84 year old is here for atraumatic right groin/hip pain for 1 year, worsening.  In setting of chronic low back pain.  Exam reveals reproducible anterior/lateral hip tenderness and pain with IR/ER.  No signs of unstable, pelvis disruption.  Extremity is MVI.  GU exam without inguinal bulging, hernia, cellulitis.  Given age, exam, high suspicion  for advanced arthritis versus cartilage/soft tissue etiology.  Less likely fracture given lack of trauma.  He has no abdominal pain, dysuria making kidney stone unlikely.  No signs of cauda equina.  Doubt epidural abscess, dissection as these don't fit clinical picture. No red flag features of back pain present such as saddle anesthesia, bladder/bowel incontinence or retention, fevers, h/o cancer, IVDU, preceding trauma or falls, unilateral weakness, urinary symptoms.  Will obtain x-rays, give pain meds, reassess.   2005: X-rays negative for acute injury but DDD and bilateral hip DJD L>R.  Pain was refractory to dilaudid, ordered MRI however pt has implanted hearing aid and cannot obtain MRI.  We discussed plan to attempt ambulation with hopes of DC home with pain control and close ortho f/u.  Pt agreed, he felt better after morphine.  Pt ambulated with tolerable pain but desaturated to 84% on RA during ambulation. He denies CP, SOB, light-headedness. Given his age, risk, h/o CAD s/p MI and CABG, cardiomyopathy, HF EF 40-45%, I recommended initiation of SOB/CP work up. Pt and wife in agreement.   2150: Pt ambulated twice.  Pt desaturated to 82% with good wave form. He remains asymptomatic. He does not have home oxygen.  Will request admission for hypoxia likely from chronic lung disease. Will need home oxygen.   2330: Patient was accepted for overnight observations by Dr. Fabio Neighbors.  RN notified me patient was refusing admission.  I spoke to patient and family at bedside.  Again, discussed reasons for admission including exertional hypoxia.  Patient states he did not have any symptoms and "I did not come here for this".  I discussed risks of leaving AGAINST MEDICAL ADVICE including worsening hypoxia, respiratory failure, death.  Patient and wife verbalized understanding of risk and requested discharge.  I encouraged patient to call his PCP tomorrow morning for reevaluation in office and further management of low  back/right hip pain. Encouraged f/u with orthopedics. Strict return precautions given.  Final Clinical Impressions(s) / ED Diagnoses   Final diagnoses:  Hypoxia  Chronic bilateral low back pain without sciatica  Right groin pain    ED Discharge Orders         Ordered    oxyCODONE (ROXICODONE) 5 MG immediate release tablet  Every 4 hours PRN     09/16/18 2334             Kinnie Feil, PA-C 09/16/18 2335    Sherwood Gambler, MD 09/18/18 1459

## 2018-09-16 NOTE — ED Notes (Signed)
Pt to MRI

## 2018-09-16 NOTE — ED Provider Notes (Signed)
Medical screening examination/treatment/procedure(s) were conducted as a shared visit with non-physician practitioner(s) and myself.  I personally evaluated the patient during the encounter.  EKG Interpretation  Date/Time:  Sunday September 16 2018 20:22:21 EDT Ventricular Rate:  71 PR Interval:    QRS Duration: 160 QT Interval:  474 QTC Calculation: 516 R Axis:   -66 Text Interpretation:  Sinus or ectopic atrial rhythm Prolonged PR interval RBBB and LAFB Baseline wander in lead(s) V5 Artifact Confirmed by Fredia Sorrow [phone]) on 09/16/2018 8:40:09 PM  Results for orders placed or performed during the hospital encounter of 09/16/18  CBC with Differential  Result Value Ref Range   WBC 8.2 4.0 - 10.5 K/uL   RBC 4.31 4.22 - 5.81 MIL/uL   Hemoglobin 13.9 13.0 - 17.0 g/dL   HCT 42.0 39.0 - 52.0 %   MCV 97.4 78.0 - 100.0 fL   MCH 32.3 26.0 - 34.0 pg   MCHC 33.1 30.0 - 36.0 g/dL   RDW 13.4 11.5 - 15.5 %   Platelets 193 150 - 400 K/uL   Neutrophils Relative % 64 %   Neutro Abs 5.2 1.7 - 7.7 K/uL   Lymphocytes Relative 26 %   Lymphs Abs 2.1 0.7 - 4.0 K/uL   Monocytes Relative 8 %   Monocytes Absolute 0.6 0.1 - 1.0 K/uL   Eosinophils Relative 2 %   Eosinophils Absolute 0.2 0.0 - 0.7 K/uL   Basophils Relative 0 %   Basophils Absolute 0.0 0.0 - 0.1 K/uL  Basic metabolic panel  Result Value Ref Range   Sodium 142 135 - 145 mmol/L   Potassium 3.9 3.5 - 5.1 mmol/L   Chloride 100 98 - 111 mmol/L   CO2 30 22 - 32 mmol/L   Glucose, Bld 105 (H) 70 - 99 mg/dL   BUN 39 (H) 8 - 23 mg/dL   Creatinine, Ser 1.69 (H) 0.61 - 1.24 mg/dL   Calcium 9.7 8.9 - 10.3 mg/dL   GFR calc non Af Amer 35 (L) >60 mL/min   GFR calc Af Amer 40 (L) >60 mL/min   Anion gap 12 5 - 15  Urinalysis, Routine w reflex microscopic  Result Value Ref Range   Color, Urine YELLOW YELLOW   APPearance CLEAR CLEAR   Specific Gravity, Urine 1.014 1.005 - 1.030   pH 5.0 5.0 - 8.0   Glucose, UA NEGATIVE NEGATIVE mg/dL   Hgb urine dipstick NEGATIVE NEGATIVE   Bilirubin Urine NEGATIVE NEGATIVE   Ketones, ur NEGATIVE NEGATIVE mg/dL   Protein, ur 30 (A) NEGATIVE mg/dL   Nitrite NEGATIVE NEGATIVE   Leukocytes, UA NEGATIVE NEGATIVE   RBC / HPF 0-5 0 - 5 RBC/hpf   WBC, UA 0-5 0 - 5 WBC/hpf   Bacteria, UA NONE SEEN NONE SEEN   Mucus PRESENT    Hyaline Casts, UA PRESENT    Granular Casts, UA PRESENT   I-Stat Troponin, ED (not at [facility])  Result Value Ref Range   Troponin i, poc 0.01 0.00 - 0.08 ng/mL   Comment 3           Dg Chest 2 View  Result Date: 09/16/2018 CLINICAL DATA:  Hypoxia on ambulation EXAM: CHEST - 2 VIEW COMPARISON:  11/12/2015 chest radiograph. FINDINGS: Intact sternotomy wires. Stable cardiomediastinal silhouette with normal heart size. No pneumothorax. No pleural effusion. No pulmonary edema. No acute consolidative airspace disease. IMPRESSION: No active cardiopulmonary disease. Electronically Signed   By: Ilona Sorrel M.D.   On: 09/16/2018 20:44  Dg Lumbar Spine Complete  Result Date: 09/16/2018 CLINICAL DATA:  Pt reported he has an abscess on his abdomen near the right side of his abdomen and lower back pain. EXAM: LUMBAR SPINE - COMPLETE 4+ VIEW COMPARISON:  CT 08/27/2017 FINDINGS: L5 pars defects with L5-S1 anterolisthesis as before. Extensive bridging osteophytes throughout the visualized lower thoracic and lumbar spine. No acute fracture. Mild narrowing of the L2-3, L4-5, and L5-S1 interspaces as before. Aortoiliac atheromatous calcifications. Surgical clips in the left mid abdomen. Bilateral hip DJD left greater than right. IMPRESSION: 1.  Negative for fracture or other acute bone abnormality. 2. Multilevel degenerative disc disease with stable L5-S1 anterolisthesis. Electronically Signed   By: Lucrezia Europe M.D.   On: 09/16/2018 16:42   Dg Hip Unilat W Or Wo Pelvis 2-3 Views Right  Result Date: 09/16/2018 CLINICAL DATA:  Pt reported he has an abscess on his abdomen near the right side of his  abdomen and lower back pain. EXAM: DG HIP (WITH OR WITHOUT PELVIS) 2-3V RIGHT COMPARISON:  CT 08/27/2017 FINDINGS: No fracture or dislocation. Extensive iliofemoral arterial calcifications. Bilateral hip DJD left worse than right. Spondylitic changes in the visualized lower lumbar spine. Extensive pelvic enthesopathy. Bilateral pelvic phleboliths. IMPRESSION: 1. No fracture or dislocation. 2. Chronic and degenerative changes as above Electronically Signed   By: Lucrezia Europe M.D.   On: 09/16/2018 16:45    Patient seen by me along with the physician assistant.  Patient initially with concerns for back pain and increased hip pain on the right side.  Concern got raised due to the severity of the pain and difficulty controlling it that may be there was infection in that area so the plan was to do MRI lumbar and MRI hip area.  However patient has a cochlear implant therefore not a candidate for MRI.  She did get better pain control.  Was able to ambulate.  But with ambulation we noted that he desaturated down to around 86% although he was asymptomatic.  Patient has occasionally needed oxygen for brief periods of time but does not have a unit at home.  Patient probably has had these decrease in sats for period of time states he gets his pulse ox checked 3 times a week at home but is always at rest.  And is usually around 94.  At rest here is 92 is 91.  But when ambulating he will drop down.  Discussed with the hospitalist will need to be admitted for arrangement of some home oxygen per night.  Patient otherwise stable.   Fredia Sorrow, MD 09/16/18 2206

## 2018-09-16 NOTE — ED Notes (Signed)
Pt ambulated on pulse oximetry. Initial oxygen saturation laying down on room air was 89%. While ambulating his saturation ranged between 84-86%. However, he was able to walk from room 6 all the way down to room 1.

## 2018-09-16 NOTE — Discharge Instructions (Addendum)
You came to ER for back and right groin pain.  We managed your pain which improved slightly.  X-rays showed degenerative changes to your low back and hips.  Your oxygen dropped to 82% with activity and walking.  We recommended overnight observation to assist setting up home oxygen device.  You declined and left against medical advice.   Please take oxycodone for severe break through pain.  Can take [phone] mg acetaminophen every 8 hours.    Call your primary care doctor in the morning and schedule an appointment for further evaluation of oxygen issues and continued management of back pain.   Return for shortness of breath, chest pain, weakness, light-headedness, cough, fevers, increased body swelling or weight.

## 2018-09-16 NOTE — ED Notes (Signed)
MRI called. Will be at the hospital shortly.

## 2018-09-16 NOTE — ED Notes (Signed)
Pt on 3 L [place] after getting 6mg  of morphine

## 2018-09-17 NOTE — Care Management Note (Signed)
Case Management Note  CM consulted for home O2 DME needs.  Pt's are not able to start home O2 through the ED.  CM noted pt and spouse did not want to stay for observation placement to initiate home O2 orders.  CM called and spoke with pt's spouse.  She advised she has already been in contact with pt's cardiologist who last saw the pt on Fri.  She, the pt, and the cardiologist are discussing the decision of home O2.  Updated Dr. Rogene Houston and Donney Rankins, [place].  No further CM needs noted at this time.  Dymir Neeson, Benjaman Lobe, RN 09/17/2018, 9:35 AM

## 2018-09-18 ENCOUNTER — Ambulatory Visit: Payer: Medicare Other | Admitting: Podiatry

## 2018-09-18 ENCOUNTER — Encounter: Payer: Self-pay | Admitting: Podiatry

## 2018-09-18 DIAGNOSIS — I739 Peripheral vascular disease, unspecified: Secondary | ICD-10-CM

## 2018-09-18 DIAGNOSIS — B351 Tinea unguium: Secondary | ICD-10-CM

## 2018-09-18 DIAGNOSIS — M79676 Pain in unspecified toe(s): Secondary | ICD-10-CM | POA: Diagnosis not present

## 2018-09-18 DIAGNOSIS — E1159 Type 2 diabetes mellitus with other circulatory complications: Secondary | ICD-10-CM

## 2018-09-18 NOTE — Progress Notes (Signed)
Patient ID: Andrew [name], male   DOB: Apr 29, 1928, 84 y.o.   MRN: [id number] Complaint:  Visit Type: Patient returns to my office for continued preventative foot care services. Complaint: Patient states" my nails have grown long and thick and become painful to walk and wear shoes" Patient has been diagnosed with DM with no foot complications. The patient presents for preventative foot care services. No changes to ROS.  Patient is on eliquiss and pletal.  Podiatric Exam: Vascular: dorsalis pedis and posterior tibial pulses are palpable bilateral. Capillary return is immediate. Temperature gradient is WNL. Skin turgor WNL  Sensorium: Normal Semmes Weinstein monofilament test. Normal tactile sensation bilaterally. Nail Exam: Pt has thick disfigured discolored nails with subungual debris noted bilateral entire nail hallux through fifth toenails Ulcer Exam: There is no evidence of ulcer or pre-ulcerative changes or infection. Orthopedic Exam: Muscle tone and strength are WNL. No limitations in general ROM. No crepitus or effusions noted. Foot type and digits show no abnormalities. Bony prominences are unremarkable. 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 Skin: No Porokeratosis. No infection or ulcers  Diagnosis:  Onychomycosis, , Pain in right toe, pain in left toes  Treatment & Plan Procedures and Treatment: Consent by patient was obtained for treatment procedures. The patient understood the discussion of treatment and procedures well. All questions were answered thoroughly reviewed. Debridement of mycotic and hypertrophic toenails, 1 through 5 bilateral and clearing of subungual debris. No ulceration, no infection noted.  Return Visit-Office Procedure: Patient instructed to return to the office for a follow up visit 10 weeks. for continued evaluation and treatment.   Gardiner Barefoot DPM

## 2018-09-19 ENCOUNTER — Telehealth (HOSPITAL_COMMUNITY): Payer: Self-pay

## 2018-09-19 NOTE — Telephone Encounter (Signed)
I called Andrew [name] to confirm our standing Thursday appointment. He stated he would be home at 13:00 would be a good time to meet.

## 2018-09-20 ENCOUNTER — Telehealth (HOSPITAL_COMMUNITY): Payer: Self-pay | Admitting: *Deleted

## 2018-09-20 ENCOUNTER — Other Ambulatory Visit (HOSPITAL_COMMUNITY): Payer: Self-pay

## 2018-09-20 NOTE — Progress Notes (Signed)
Paramedicine Encounter    Patient ID: Andrew [name], male    DOB: February 18, 1928, 84 y.o.   MRN: [id number]   Patient Care Team: [name], MD as PCP - General (Family Medicine) Larey Dresser, MD as Consulting Physician (Cardiology)  Patient Active Problem List   Diagnosis Date Noted  . Chronic respiratory failure with hypoxia ([place]) 09/16/2018  . Bilateral recurrent inguinal hernias 11/28/2017  . Calf pain 05/25/2016  . Carotid artery stenosis 01/12/2016  . Chronic systolic CHF (congestive heart failure) ([place]) 12/02/2015  . Cerebellar infarct ([place]) 10/16/2015  . 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 ([employer] [place]) 10/16/2015  . PVD (peripheral vascular disease) ([place])   . Hereditary and idiopathic peripheral neuropathy 10/02/2015  . Peripelvic (lymphatic) cyst   . Pernicious anemia 07/15/2015  . Bilateral renal cysts 07/12/2015  . Lung nodule, 79mm RML CT 07/12/15 07/12/2015  . Constipation 07/12/2015  . Kidney stone on right side 07/12/2015  . Stage 3 chronic kidney disease ([place]) 06/14/2015  . Chronic diastolic CHF (congestive heart failure) ([place]) 05/28/2015  . Paroxysmal atrial fibrillation ([place]) 05/28/2015  . AAA (abdominal aortic aneurysm) without rupture ([place]) 09/30/2014  . Left-sided low back pain without sciatica 07/21/2014  . Cardiomyopathy, ischemic 05/23/2014  . DM (diabetes mellitus), type 2 with renal complications ([place]) 57/12/7791  . CORONARY ATHEROSCLEROSIS NATIVE CORONARY ARTERY 11/09/2010  . Depression, major, single episode, complete remission ([place]) 03/18/2010  . Hypothyroidism 02/03/2009  . Osteoarthritis 05/27/2008  . Hyperlipidemia associated with type 2 diabetes mellitus ([place]) 06/14/2007  . Hypertension associated with diabetes ([place]) 06/14/2007  . GERD 06/14/2007    Current Outpatient Medications:  .  amiodarone (PACERONE) 200 MG tablet, Take 0.5 tablets (100 mg total) by mouth daily., Disp: 45 tablet, Rfl: 3 .  atorvastatin (LIPITOR) 80 MG  tablet, Take 1 tablet (80 mg total) by mouth daily., Disp: 30 tablet, Rfl: 6 .  carvedilol (COREG) 12.5 MG tablet, TAKE 1 TABLET BY MOUTH TWICE DAILY., Disp: 180 tablet, Rfl: 3 .  cilostazol (PLETAL) 100 MG tablet, TAKE 1 TABLET BY MOUTH TWICE DAILY., Disp: 180 tablet, Rfl: 3 .  ELIQUIS 2.5 MG TABS tablet, TAKE 1 TABLET BY MOUTH TWICE DAILY., Disp: 60 tablet, Rfl: 11 .  ezetimibe (ZETIA) 10 MG tablet, TAKE (1) TABLET DAILY., Disp: 90 tablet, Rfl: 0 .  FLUoxetine (PROZAC) 20 MG capsule, fluoxetine 20 mg capsule, Disp: , Rfl:  .  furosemide (LASIX) 80 MG tablet, Take 1.5 tablets (120 mg total) by mouth every morning AND 1 tablet (80 mg total) every evening., Disp: 75 tablet, Rfl: 6 .  hydrALAZINE (APRESOLINE) 50 MG tablet, Take 1 tablet (50 mg total) by mouth 3 (three) times daily., Disp: 90 tablet, Rfl: 3 .  isosorbide mononitrate (IMDUR) 30 MG 24 hr tablet, Take 1 tablet (30 mg total) by mouth daily., Disp: 30 tablet, Rfl: 6 .  levothyroxine (SYNTHROID, LEVOTHROID) 125 MCG tablet, Take 1 tablet (125 mcg total) by mouth daily., Disp: 90 tablet, Rfl: 1 .  Multiple Vitamin (MULTIVITAMIN) tablet, Take 1 tablet by mouth daily., Disp: , Rfl:  .  potassium chloride SA (K-DUR,KLOR-CON) 20 MEQ tablet, Take 1 tablet (20 mEq total) by mouth daily., Disp: 90 tablet, Rfl: 3 .  temazepam (RESTORIL) 30 MG capsule, TAKE 1 CAPSULE AT BEDTIME AS NEEDED FOR SLEEP., Disp: 30 capsule, Rfl: 0 .  tiotropium (SPIRIVA HANDIHALER) 18 MCG inhalation capsule, Place 1 capsule (18 mcg total) into inhaler and inhale daily., Disp: 30 capsule, Rfl: 12 .  amoxicillin (AMOXIL) 500 MG capsule, amoxicillin 500 mg capsule, Disp: , Rfl:  .  glucose blood (ACCU-CHEK AVIVA PLUS) test strip, CHECK BLOOD SUGAR 4 TIMES A DAY., Disp: 100 each, Rfl: 11 .  HYDROcodone-acetaminophen (NORCO/VICODIN) 5-325 MG tablet, hydrocodone 5 mg-acetaminophen 325 mg tablet, Disp: , Rfl:  .  Lancets MISC, Check blood sugar 4 times a day, Disp: 100 each, Rfl:  11 .  losartan (COZAAR) 50 MG tablet, losartan 50 mg tablet, Disp: , Rfl:  .  NITROSTAT 0.4 MG SL tablet, DISSOLVE 1 TABLET UNDER TONGUE AS NEEDED FOR CHEST PAIN,MAY REPEAT IN5 MINUTES FOR 2 DOSES. (Patient not taking: Reported on 09/20/2018), Disp: 25 tablet, Rfl: 6 .  Potassium Chloride ER 20 MEQ TBCR, Take 20 mEq by mouth daily., Disp: 30 tablet, Rfl: 3 .  traMADol (ULTRAM) 50 MG tablet, Take 1 tablet (50 mg total) by mouth every 6 (six) hours as needed. (Patient not taking: Reported on 09/20/2018), Disp: 12 tablet, Rfl: 0 .  UNABLE TO FIND, nifedipine ER 30 mg tablet,extended release, Disp: , Rfl:  Allergies  Allergen Reactions  . Metoprolol Shortness Of Breath, Palpitations and Other (See Comments)    Heart starts racing. Shallow breathing; pt states he also get skin irritation on his legs  . Metformin Hives and Swelling    On legs  . Metformin And Related Nausea And Vomiting      Social History   Socioeconomic History  . Marital status: Married    Spouse name: Not on file  . Number of children: Not on file  . Years of education: Not on file  . Highest education level: Not on file  Occupational History  . 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 Financial resource strain: Not on file  . Food insecurity:    Worry: Not on file    Inability: Not on file  . Transportation needs:    Medical: Not on file    Non-medical: Not on file  Tobacco Use  . Smoking status: Former Smoker    Packs/day: 1.50    Years: 35.00    Pack years: 52.50    Types: Cigarettes  . Smokeless tobacco: Never Used  . Tobacco comment: quit atleast 25 yrs ago, per pt  Substance and Sexual Activity  . Alcohol use: Yes    Alcohol/week: 3.0 standard drinks    Types: 3 Shots of liquor per week    Comment: socially  . Drug use: No  . Sexual activity: Yes  Lifestyle  . Physical activity:    Days per week: Not on file    Minutes per session: Not on file  . Stress: Not on file  Relationships  . Social  connections:    Talks on phone: Not on file    Gets together: Not on file    Attends religious service: Not on file    Active member of club or organization: Not on file    Attends meetings of clubs or organizations: Not on file    Relationship status: Not on file  . Intimate partner violence:    Fear of current or ex partner: Not on file    Emotionally abused: Not on file    Physically abused: Not on file    Forced sexual activity: Not on file  Other Topics Concern  . Not on file  Social History Narrative   Pt lives in [place] with spouse.   Retired from [employer].  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    Physical Exam  Constitutional: He is oriented to person, place, and time.  Cardiovascular: Normal rate and regular rhythm.  Pulmonary/Chest: Effort normal and breath sounds normal.  Musculoskeletal: Normal range of motion. He exhibits no edema.  Neurological: He is alert and oriented to person, place, and time.  Skin: Skin is warm and dry.  Psychiatric: He has a normal mood and affect.        Future Appointments  Date Time Provider [place]  12/11/2018  4:00 PM Gardiner Barefoot, DPM [facility]  12/24/2018 11:40 AM Larey Dresser, MD [facility] None    BP 114/68 (BP Location: Left Arm, Patient Position: Sitting, Cuff Size: Large)   Pulse 76   Resp 16   Wt 193 lb (87.5 kg)   SpO2 91%   BMI 30.23 kg/m   Weight yesterday- Did not weigh Last visit weight- 195 lb  Andrew [name] was seen at home today and reported feeling well. He stated he has been compliant with his medications over the past week. He denied SOB, headache, dizziness or orthopnea. While verifying his medications, I noted that Losartan had been added to his list. I called his pharmacy who advised they did not have any new prescriptions for Losartan. I contacted the clinic who was unaware of any change being made and would remove the  medication from his list. His pillbox was refilled.   [name], EMT 09/20/18  ACTION: Home visit completed Next visit planned for 1 week

## 2018-09-20 NOTE — Telephone Encounter (Signed)
Andrew [name] with paramedicine called to ask if patient was suppose to be taking Losartan.  Was stopped according to last OV note due to rise in Creatine.  BP today was 114/68, per Darrick Grinder, NP patient may remain off losartan.  MAR updated.

## 2018-09-27 ENCOUNTER — Other Ambulatory Visit (HOSPITAL_COMMUNITY): Payer: Self-pay

## 2018-09-27 NOTE — Progress Notes (Signed)
Paramedicine Encounter    Patient ID: Andrew [name], male    DOB: 08/05/1928, 84 y.o.   MRN: [id number]   Patient Care Team: [name], MD as PCP - General (Family Medicine) Larey Dresser, MD as Consulting Physician (Cardiology)  Patient Active Problem List   Diagnosis Date Noted  . Chronic respiratory failure with hypoxia ([place]) 09/16/2018  . Bilateral recurrent inguinal hernias 11/28/2017  . Calf pain 05/25/2016  . Carotid artery stenosis 01/12/2016  . Chronic systolic CHF (congestive heart failure) ([place]) 12/02/2015  . Cerebellar infarct ([place]) 10/16/2015  . 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 PVD (peripheral vascular disease) ([place])   . Hereditary and idiopathic peripheral neuropathy 10/02/2015  . Peripelvic (lymphatic) cyst   . Pernicious anemia 07/15/2015  . Bilateral renal cysts 07/12/2015  . Lung nodule, 67mm RML CT 07/12/15 07/12/2015  . Constipation 07/12/2015  . Kidney stone on right side 07/12/2015  . Stage 3 chronic kidney disease ([place]) 06/14/2015  . Chronic diastolic CHF (congestive heart failure) ([place]) 05/28/2015  . Paroxysmal atrial fibrillation ([employer]) 05/28/2015  . AAA (abdominal aortic aneurysm) without rupture ([place]) 09/30/2014  . Left-sided low back pain without sciatica 07/21/2014  . Cardiomyopathy, ischemic 05/23/2014  . DM (diabetes mellitus), type 2 with renal complications ([place]) 50/38/8828  . CORONARY ATHEROSCLEROSIS NATIVE CORONARY ARTERY 11/09/2010  . Depression, major, single episode, complete remission ([place]) 03/18/2010  . Hypothyroidism 02/03/2009  . Osteoarthritis 05/27/2008  . Hyperlipidemia associated with type 2 diabetes mellitus ([place]) 06/14/2007  . Hypertension associated with diabetes ([place]) 06/14/2007  . GERD 06/14/2007    Current Outpatient Medications:  .  amiodarone (PACERONE) 200 MG tablet, Take 0.5 tablets (100 mg total) by mouth daily., Disp: 45 tablet, Rfl: 3 .  atorvastatin (LIPITOR) 80 MG  tablet, Take 1 tablet (80 mg total) by mouth daily., Disp: 30 tablet, Rfl: 6 .  carvedilol (COREG) 12.5 MG tablet, TAKE 1 TABLET BY MOUTH TWICE DAILY., Disp: 180 tablet, Rfl: 3 .  cilostazol (PLETAL) 100 MG tablet, TAKE 1 TABLET BY MOUTH TWICE DAILY., Disp: 180 tablet, Rfl: 3 .  ELIQUIS 2.5 MG TABS tablet, TAKE 1 TABLET BY MOUTH TWICE DAILY., Disp: 60 tablet, Rfl: 11 .  ezetimibe (ZETIA) 10 MG tablet, TAKE (1) TABLET DAILY., Disp: 90 tablet, Rfl: 0 .  FLUoxetine (PROZAC) 20 MG capsule, fluoxetine 20 mg capsule, Disp: , Rfl:  .  furosemide (LASIX) 80 MG tablet, Take 1.5 tablets (120 mg total) by mouth every morning AND 1 tablet (80 mg total) every evening., Disp: 75 tablet, Rfl: 6 .  glucose blood (ACCU-CHEK AVIVA PLUS) test strip, CHECK BLOOD SUGAR 4 TIMES A DAY., Disp: 100 each, Rfl: 11 .  hydrALAZINE (APRESOLINE) 50 MG tablet, Take 1 tablet (50 mg total) by mouth 3 (three) times daily., Disp: 90 tablet, Rfl: 3 .  HYDROcodone-acetaminophen (NORCO/VICODIN) 5-325 MG tablet, hydrocodone 5 mg-acetaminophen 325 mg tablet, Disp: , Rfl:  .  isosorbide mononitrate (IMDUR) 30 MG 24 hr tablet, Take 1 tablet (30 mg total) by mouth daily., Disp: 30 tablet, Rfl: 6 .  levothyroxine (SYNTHROID, LEVOTHROID) 125 MCG tablet, Take 1 tablet (125 mcg total) by mouth daily., Disp: 90 tablet, Rfl: 1 .  Multiple Vitamin (MULTIVITAMIN) tablet, Take 1 tablet by mouth daily., Disp: , Rfl:  .  potassium chloride SA (K-DUR,KLOR-CON) 20 MEQ tablet, Take 1 tablet (20 mEq total) by mouth daily., Disp: 90 tablet, Rfl: 3 .  temazepam (RESTORIL) 30 MG capsule, TAKE 1 CAPSULE  AT BEDTIME AS NEEDED FOR SLEEP., Disp: 30 capsule, Rfl: 0 .  tiotropium (SPIRIVA HANDIHALER) 18 MCG inhalation capsule, Place 1 capsule (18 mcg total) into inhaler and inhale daily., Disp: 30 capsule, Rfl: 12 .  amoxicillin (AMOXIL) 500 MG capsule, amoxicillin 500 mg capsule, Disp: , Rfl:  .  Lancets MISC, Check blood sugar 4 times a day, Disp: 100 each, Rfl:  11 .  NITROSTAT 0.4 MG SL tablet, DISSOLVE 1 TABLET UNDER TONGUE AS NEEDED FOR CHEST PAIN,MAY REPEAT IN5 MINUTES FOR 2 DOSES. (Patient not taking: Reported on 09/20/2018), Disp: 25 tablet, Rfl: 6 .  Potassium Chloride ER 20 MEQ TBCR, Take 20 mEq by mouth daily., Disp: 30 tablet, Rfl: 3 .  traMADol (ULTRAM) 50 MG tablet, Take 1 tablet (50 mg total) by mouth every 6 (six) hours as needed. (Patient not taking: Reported on 09/20/2018), Disp: 12 tablet, Rfl: 0 .  UNABLE TO FIND, nifedipine ER 30 mg tablet,extended release, Disp: , Rfl:  Allergies  Allergen Reactions  . Metoprolol Shortness Of Breath, Palpitations and Other (See Comments)    Heart starts racing. Shallow breathing; pt states he also get skin irritation on his legs  . Metformin Hives and Swelling    On legs  . Metformin And Related Nausea And Vomiting      Social History   Socioeconomic History  . Marital status: Married    Spouse name: Not on file  . Number of children: Not on file  . Years of education: Not on file  . Highest education level: Not on file  Occupational History  . 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 Financial resource strain: Not on file  . Food insecurity:    Worry: Not on file    Inability: Not on file  . Transportation needs:    Medical: Not on file    Non-medical: Not on file  Tobacco Use  . Smoking status: Former Smoker    Packs/day: 1.50    Years: 35.00    Pack years: 52.50    Types: Cigarettes  . Smokeless tobacco: Never Used  . Tobacco comment: quit atleast 25 yrs ago, per pt  Substance and Sexual Activity  . Alcohol use: Yes    Alcohol/week: 3.0 standard drinks    Types: 3 Shots of liquor per week    Comment: socially  . Drug use: No  . Sexual activity: Yes  Lifestyle  . Physical activity:    Days per week: Not on file    Minutes per session: Not on file  . Stress: Not on file  Relationships  . Social connections:    Talks on phone: Not on file    Gets together: Not on file     Attends religious service: Not on file    Active member of club or organization: Not on file    Attends meetings of clubs or organizations: Not on file    Relationship status: Not on file  . Intimate partner violence:    Fear of current or ex partner: Not on file    Emotionally abused: Not on file    Physically abused: Not on file    Forced sexual activity: Not on file  Other Topics Concern  . Not on file  Social History Narrative   Pt lives in [place] with spouse.   Retired from [employer].  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    Physical Exam  Constitutional: He is oriented to person, place,  and time.  Cardiovascular: Normal rate and regular rhythm.  Pulmonary/Chest: Effort normal.  Abdominal: Soft.  Musculoskeletal: Normal range of motion. He exhibits no edema.  Neurological: He is alert and oriented to person, place, and time.  Skin: Skin is warm and dry.  Psychiatric: He has a normal mood and affect.        Future Appointments  Date Time Provider [place]  12/11/2018  4:00 PM Gardiner Barefoot, DPM [facility]  12/24/2018 11:40 AM Larey Dresser, MD [facility] None    BP (!) 150/80 (BP Location: Left Arm, Patient Position: Sitting, Cuff Size: Large)   Pulse 65   Resp 16   Wt 193 lb (87.5 kg)   SpO2 93%   BMI 30.23 kg/m   Weight yesterday- 193 lb Last visit weight-  Mr Throne was seen at home today and reported feeling well. He denied SOB, headache, dizziness or orthopnea. He said he had missed a few doses of medications over the past few days but has been mostly compliant. His medications were verified and his pillbox was refilled.   [name], EMT 09/28/18  ACTION: Home visit completed Next visit planned for 1 week

## 2018-10-04 ENCOUNTER — Other Ambulatory Visit (HOSPITAL_COMMUNITY): Payer: Self-pay

## 2018-10-04 ENCOUNTER — Telehealth (HOSPITAL_COMMUNITY): Payer: Self-pay

## 2018-10-04 DIAGNOSIS — M47816 Spondylosis without myelopathy or radiculopathy, lumbar region: Secondary | ICD-10-CM | POA: Insufficient documentation

## 2018-10-04 NOTE — Telephone Encounter (Signed)
I called Andrew [name] to see if he would be available if I came a bit earlier today than scheduled. He stated he had just gotten home so any time would work for him.

## 2018-10-04 NOTE — Progress Notes (Signed)
Paramedicine Encounter    Patient ID: SILVIA MARKUSON, male    DOB: September 11, 1928, 84 y.o.   MRN: [id number]   Patient Care Team: [name], MD as PCP - General (Family Medicine) Larey Dresser, MD as Consulting Physician (Cardiology)  Patient Active Problem List   Diagnosis Date Noted  . Chronic respiratory failure with hypoxia ([place]) 09/16/2018  . Bilateral recurrent inguinal hernias 11/28/2017  . Calf pain 05/25/2016  . Carotid artery stenosis 01/12/2016  . Chronic systolic CHF (congestive heart failure) ([place]) 12/02/2015  . Cerebellar infarct ([place]) 10/16/2015  . 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 PVD (peripheral vascular disease) ([place])   . Hereditary and idiopathic peripheral neuropathy 10/02/2015  . Peripelvic (lymphatic) cyst   . Pernicious anemia 07/15/2015  . Bilateral renal cysts 07/12/2015  . Lung nodule, 2mm RML CT 07/12/15 07/12/2015  . Constipation 07/12/2015  . Kidney stone on right side 07/12/2015  . Stage 3 chronic kidney disease ([place]) 06/14/2015  . Chronic diastolic CHF (congestive heart failure) ([place]) 05/28/2015  . Paroxysmal atrial fibrillation ([place]) 05/28/2015  . AAA (abdominal aortic aneurysm) without rupture ([place]) 09/30/2014  . Left-sided low back pain without sciatica 07/21/2014  . Cardiomyopathy, ischemic 05/23/2014  . DM (diabetes mellitus), type 2 with renal complications ([place]) 47/65/4650  . CORONARY ATHEROSCLEROSIS NATIVE CORONARY ARTERY 11/09/2010  . Depression, major, single episode, complete remission ([place]) 03/18/2010  . Hypothyroidism 02/03/2009  . Osteoarthritis 05/27/2008  . Hyperlipidemia associated with type 2 diabetes mellitus ([place]) 06/14/2007  . Hypertension associated with diabetes ([place]) 06/14/2007  . GERD 06/14/2007    Current Outpatient Medications:  .  amiodarone (PACERONE) 200 MG tablet, Take 0.5 tablets (100 mg total) by mouth daily., Disp: 45 tablet, Rfl: 3 .  atorvastatin (LIPITOR) 80 MG  tablet, Take 1 tablet (80 mg total) by mouth daily., Disp: 30 tablet, Rfl: 6 .  carvedilol (COREG) 12.5 MG tablet, TAKE 1 TABLET BY MOUTH TWICE DAILY., Disp: 180 tablet, Rfl: 3 .  ELIQUIS 2.5 MG TABS tablet, TAKE 1 TABLET BY MOUTH TWICE DAILY., Disp: 60 tablet, Rfl: 11 .  ezetimibe (ZETIA) 10 MG tablet, TAKE (1) TABLET DAILY., Disp: 90 tablet, Rfl: 0 .  FLUoxetine (PROZAC) 20 MG capsule, fluoxetine 20 mg capsule, Disp: , Rfl:  .  furosemide (LASIX) 80 MG tablet, Take 1.5 tablets (120 mg total) by mouth every morning AND 1 tablet (80 mg total) every evening., Disp: 75 tablet, Rfl: 6 .  glucose blood (ACCU-CHEK AVIVA PLUS) test strip, CHECK BLOOD SUGAR 4 TIMES A DAY., Disp: 100 each, Rfl: 11 .  hydrALAZINE (APRESOLINE) 50 MG tablet, Take 1 tablet (50 mg total) by mouth 3 (three) times daily., Disp: 90 tablet, Rfl: 3 .  HYDROcodone-acetaminophen (NORCO/VICODIN) 5-325 MG tablet, hydrocodone 5 mg-acetaminophen 325 mg tablet, Disp: , Rfl:  .  isosorbide mononitrate (IMDUR) 30 MG 24 hr tablet, Take 1 tablet (30 mg total) by mouth daily., Disp: 30 tablet, Rfl: 6 .  levothyroxine (SYNTHROID, LEVOTHROID) 125 MCG tablet, Take 1 tablet (125 mcg total) by mouth daily., Disp: 90 tablet, Rfl: 1 .  Multiple Vitamin (MULTIVITAMIN) tablet, Take 1 tablet by mouth daily., Disp: , Rfl:  .  potassium chloride SA (K-DUR,KLOR-CON) 20 MEQ tablet, Take 1 tablet (20 mEq total) by mouth daily., Disp: 90 tablet, Rfl: 3 .  temazepam (RESTORIL) 30 MG capsule, TAKE 1 CAPSULE AT BEDTIME AS NEEDED FOR SLEEP., Disp: 30 capsule, Rfl: 0 .  tiotropium (SPIRIVA HANDIHALER) 18 MCG inhalation  capsule, Place 1 capsule (18 mcg total) into inhaler and inhale daily., Disp: 30 capsule, Rfl: 12 .  amoxicillin (AMOXIL) 500 MG capsule, amoxicillin 500 mg capsule, Disp: , Rfl:  .  cilostazol (PLETAL) 100 MG tablet, TAKE 1 TABLET BY MOUTH TWICE DAILY., Disp: 180 tablet, Rfl: 3 .  Lancets MISC, Check blood sugar 4 times a day, Disp: 100 each, Rfl:  11 .  NITROSTAT 0.4 MG SL tablet, DISSOLVE 1 TABLET UNDER TONGUE AS NEEDED FOR CHEST PAIN,MAY REPEAT IN5 MINUTES FOR 2 DOSES. (Patient not taking: Reported on 09/20/2018), Disp: 25 tablet, Rfl: 6 .  Potassium Chloride ER 20 MEQ TBCR, Take 20 mEq by mouth daily., Disp: 30 tablet, Rfl: 3 .  traMADol (ULTRAM) 50 MG tablet, Take 1 tablet (50 mg total) by mouth every 6 (six) hours as needed. (Patient not taking: Reported on 09/20/2018), Disp: 12 tablet, Rfl: 0 .  UNABLE TO FIND, nifedipine ER 30 mg tablet,extended release, Disp: , Rfl:  Allergies  Allergen Reactions  . Metoprolol Shortness Of Breath, Palpitations and Other (See Comments)    Heart starts racing. Shallow breathing; pt states he also get skin irritation on his legs  . Metformin Hives and Swelling    On legs  . Metformin And Related Nausea And Vomiting      Social History   Socioeconomic History  . Marital status: Married    Spouse name: Not on file  . Number of children: Not on file  . Years of education: Not on file  . Highest education level: Not on file  Occupational History  . 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 Financial resource strain: Not on file  . Food insecurity:    Worry: Not on file    Inability: Not on file  . Transportation needs:    Medical: Not on file    Non-medical: Not on file  Tobacco Use  . Smoking status: Former Smoker    Packs/day: 1.50    Years: 35.00    Pack years: 52.50    Types: Cigarettes  . Smokeless tobacco: Never Used  . Tobacco comment: quit atleast 25 yrs ago, per pt  Substance and Sexual Activity  . Alcohol use: Yes    Alcohol/week: 3.0 standard drinks    Types: 3 Shots of liquor per week    Comment: socially  . Drug use: No  . Sexual activity: Yes  Lifestyle  . Physical activity:    Days per week: Not on file    Minutes per session: Not on file  . Stress: Not on file  Relationships  . Social connections:    Talks on phone: Not on file    Gets together: Not on file     Attends religious service: Not on file    Active member of club or organization: Not on file    Attends meetings of clubs or organizations: Not on file    Relationship status: Not on file  . Intimate partner violence:    Fear of current or ex partner: Not on file    Emotionally abused: Not on file    Physically abused: Not on file    Forced sexual activity: Not on file  Other Topics Concern  . Not on file  Social History Narrative   Pt lives in [place] with spouse.   Retired from [employer].  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    Physical Exam  Constitutional: He is oriented to person, place,  and time.  Cardiovascular: Normal rate and regular rhythm.  Pulmonary/Chest: Effort normal. He has rhonchi in the right lower field and the left lower field.  Abdominal: Soft.  Musculoskeletal: Normal range of motion. He exhibits no edema.  Neurological: He is alert and oriented to person, place, and time.  Skin: Skin is warm and dry.  Psychiatric: He has a normal mood and affect.        Future Appointments  Date Time Provider [place]  12/11/2018  4:00 PM Gardiner Barefoot, DPM [facility]  12/24/2018 11:40 AM Larey Dresser, MD [facility] None    BP 110/62 (BP Location: Left Arm, Patient Position: Sitting, Cuff Size: Large)   Pulse 62   Resp 16   Wt 193 lb (87.5 kg)   SpO2 92%   BMI 30.23 kg/m   Weight yesterday- Did not weigh Last visit weight- 193 lb  Mr Nijjar was seen at home today and reported feeling well. He denied any SOB, headache, dizziness or increased orthopnea. He stated he has been compliant with his medications for the most past, which was evident by the few missed doses from last week, still in his pillbox. He denied having any weight fluctuation since last week but I am unsure if he is weighing daily. I stressed the importance of getting daily weights and he said he understood. His  medications were verified and his pillbox was refilled.   [name], EMT 10/04/18  ACTION: Home visit completed Next visit planned for 1 week

## 2018-10-11 ENCOUNTER — Other Ambulatory Visit (HOSPITAL_COMMUNITY): Payer: Self-pay

## 2018-10-11 ENCOUNTER — Other Ambulatory Visit (HOSPITAL_COMMUNITY): Payer: Self-pay | Admitting: Cardiology

## 2018-10-11 NOTE — Progress Notes (Signed)
Paramedicine Encounter    Patient ID: Andrew [name], male    DOB: 1928/10/07, 84 y.o.   MRN: [id number]   Patient Care Team: [name], MD as PCP - General (Family Medicine) Larey Dresser, MD as Consulting Physician (Cardiology)  Patient Active Problem List   Diagnosis Date Noted  . Chronic respiratory failure with hypoxia ([place]) 09/16/2018  . Bilateral recurrent inguinal hernias 11/28/2017  . Calf pain 05/25/2016  . Carotid artery stenosis 01/12/2016  . Chronic systolic CHF (congestive heart failure) ([place]) 12/02/2015  . Cerebellar infarct ([place]) 10/16/2015  . 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 PVD (peripheral vascular disease) ([place])   . Hereditary and idiopathic peripheral neuropathy 10/02/2015  . Peripelvic (lymphatic) cyst   . Pernicious anemia 07/15/2015  . Bilateral renal cysts 07/12/2015  . Lung nodule, 66mm RML CT 07/12/15 07/12/2015  . Constipation 07/12/2015  . Kidney stone on right side 07/12/2015  . Stage 3 chronic kidney disease ([place]) 06/14/2015  . Chronic diastolic CHF (congestive heart failure) ([place]) 05/28/2015  . Paroxysmal atrial fibrillation ([place]) 05/28/2015  . AAA (abdominal aortic aneurysm) without rupture ([place]) 09/30/2014  . Left-sided low back pain without sciatica 07/21/2014  . Cardiomyopathy, ischemic 05/23/2014  . DM (diabetes mellitus), type 2 with renal complications ([place]) 65/46/5035  . CORONARY ATHEROSCLEROSIS NATIVE CORONARY ARTERY 11/09/2010  . Depression, major, single episode, complete remission ([place]) 03/18/2010  . Hypothyroidism 02/03/2009  . Osteoarthritis 05/27/2008  . Hyperlipidemia associated with type 2 diabetes mellitus ([place]) 06/14/2007  . Hypertension associated with diabetes ([place]) 06/14/2007  . GERD 06/14/2007    Current Outpatient Medications:  .  amiodarone (PACERONE) 200 MG tablet, Take 0.5 tablets (100 mg total) by mouth daily., Disp: 45 tablet, Rfl: 3 .  atorvastatin (LIPITOR) 80 MG  tablet, Take 1 tablet (80 mg total) by mouth daily., Disp: 30 tablet, Rfl: 6 .  carvedilol (COREG) 12.5 MG tablet, TAKE 1 TABLET BY MOUTH TWICE DAILY., Disp: 180 tablet, Rfl: 3 .  cilostazol (PLETAL) 100 MG tablet, TAKE 1 TABLET BY MOUTH TWICE DAILY., Disp: 180 tablet, Rfl: 3 .  ELIQUIS 2.5 MG TABS tablet, TAKE 1 TABLET BY MOUTH TWICE DAILY., Disp: 60 tablet, Rfl: 11 .  ezetimibe (ZETIA) 10 MG tablet, TAKE (1) TABLET DAILY., Disp: 90 tablet, Rfl: 0 .  FLUoxetine (PROZAC) 20 MG capsule, fluoxetine 20 mg capsule, Disp: , Rfl:  .  furosemide (LASIX) 80 MG tablet, Take 1.5 tablets (120 mg total) by mouth every morning AND 1 tablet (80 mg total) every evening., Disp: 75 tablet, Rfl: 6 .  glucose blood (ACCU-CHEK AVIVA PLUS) test strip, CHECK BLOOD SUGAR 4 TIMES A DAY., Disp: 100 each, Rfl: 11 .  hydrALAZINE (APRESOLINE) 50 MG tablet, Take 1 tablet (50 mg total) by mouth 3 (three) times daily., Disp: 90 tablet, Rfl: 3 .  HYDROcodone-acetaminophen (NORCO/VICODIN) 5-325 MG tablet, hydrocodone 5 mg-acetaminophen 325 mg tablet, Disp: , Rfl:  .  isosorbide mononitrate (IMDUR) 30 MG 24 hr tablet, Take 1 tablet (30 mg total) by mouth daily., Disp: 30 tablet, Rfl: 6 .  levothyroxine (SYNTHROID, LEVOTHROID) 125 MCG tablet, Take 1 tablet (125 mcg total) by mouth daily., Disp: 90 tablet, Rfl: 1 .  Multiple Vitamin (MULTIVITAMIN) tablet, Take 1 tablet by mouth daily., Disp: , Rfl:  .  potassium chloride SA (K-DUR,KLOR-CON) 20 MEQ tablet, Take 1 tablet (20 mEq total) by mouth daily., Disp: 90 tablet, Rfl: 3 .  temazepam (RESTORIL) 30 MG capsule, TAKE 1 CAPSULE  AT BEDTIME AS NEEDED FOR SLEEP., Disp: 30 capsule, Rfl: 0 .  tiotropium (SPIRIVA HANDIHALER) 18 MCG inhalation capsule, Place 1 capsule (18 mcg total) into inhaler and inhale daily., Disp: 30 capsule, Rfl: 12 .  amoxicillin (AMOXIL) 500 MG capsule, amoxicillin 500 mg capsule, Disp: , Rfl:  .  Lancets MISC, Check blood sugar 4 times a day, Disp: 100 each, Rfl:  11 .  NITROSTAT 0.4 MG SL tablet, DISSOLVE 1 TABLET UNDER TONGUE AS NEEDED FOR CHEST PAIN,MAY REPEAT IN5 MINUTES FOR 2 DOSES. (Patient not taking: Reported on 09/20/2018), Disp: 25 tablet, Rfl: 6 .  Potassium Chloride ER 20 MEQ TBCR, Take 20 mEq by mouth daily., Disp: 30 tablet, Rfl: 3 .  traMADol (ULTRAM) 50 MG tablet, Take 1 tablet (50 mg total) by mouth every 6 (six) hours as needed. (Patient not taking: Reported on 09/20/2018), Disp: 12 tablet, Rfl: 0 .  UNABLE TO FIND, nifedipine ER 30 mg tablet,extended release, Disp: , Rfl:  Allergies  Allergen Reactions  . Metoprolol Shortness Of Breath, Palpitations and Other (See Comments)    Heart starts racing. Shallow breathing; pt states he also get skin irritation on his legs  . Metformin Hives and Swelling    On legs  . Metformin And Related Nausea And Vomiting      Social History   Socioeconomic History  . Marital status: Married    Spouse name: Not on file  . Number of children: Not on file  . Years of education: Not on file  . Highest education level: Not on file  Occupational History  . 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 Financial resource strain: Not on file  . Food insecurity:    Worry: Not on file    Inability: Not on file  . Transportation needs:    Medical: Not on file    Non-medical: Not on file  Tobacco Use  . Smoking status: Former Smoker    Packs/day: 1.50    Years: 35.00    Pack years: 52.50    Types: Cigarettes  . Smokeless tobacco: Never Used  . Tobacco comment: quit atleast 25 yrs ago, per pt  Substance and Sexual Activity  . Alcohol use: Yes    Alcohol/week: 3.0 standard drinks    Types: 3 Shots of liquor per week    Comment: socially  . Drug use: No  . Sexual activity: Yes  Lifestyle  . Physical activity:    Days per week: Not on file    Minutes per session: Not on file  . Stress: Not on file  Relationships  . Social connections:    Talks on phone: Not on file    Gets together: Not on file     Attends religious service: Not on file    Active member of club or organization: Not on file    Attends meetings of clubs or organizations: Not on file    Relationship status: Not on file  . Intimate partner violence:    Fear of current or ex partner: Not on file    Emotionally abused: Not on file    Physically abused: Not on file    Forced sexual activity: Not on file  Other Topics Concern  . Not on file  Social History Narrative   Pt lives in [place] with spouse.   Retired from [employer].  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    Physical Exam  Constitutional: He is oriented to person, place,  and time.  Cardiovascular: Normal rate and regular rhythm.  Pulmonary/Chest: Effort normal and breath sounds normal.  Abdominal: Soft.  Musculoskeletal: Normal range of motion. He exhibits no edema.  Neurological: He is alert and oriented to person, place, and time.  Skin: Skin is warm and dry.  Psychiatric: He has a normal mood and affect.        Future Appointments  Date Time Provider [place]  12/11/2018  4:00 PM Gardiner Barefoot, DPM [facility]  12/24/2018 11:40 AM Larey Dresser, MD [facility] None    BP 128/66 (BP Location: Left Arm, Patient Position: Sitting, Cuff Size: Large)   Pulse 70   Resp 16   Wt 194 lb (88 kg)   SpO2 91%   BMI 30.38 kg/m   Weight yesterday- Did not weigh Last visit weight- 193 lb  Mr Coombs was seen at home today and reported feeling well. He stated he has been compliant with his medications and his weight has been stable, though he continues failing to weigh himself daily. I encouraged him to follow through on daily weights because it helps up track his fluid retention. He denied SOB, headache, dizziness or orthopnea. His medications were verified and his pillbox was refilled.   [name], EMT 10/11/18  ACTION: Home visit completed Next visit planned for 1  week

## 2018-10-18 ENCOUNTER — Telehealth (HOSPITAL_COMMUNITY): Payer: Self-pay

## 2018-10-18 ENCOUNTER — Other Ambulatory Visit (HOSPITAL_COMMUNITY): Payer: Self-pay

## 2018-10-18 NOTE — Telephone Encounter (Signed)
I called Andrew [name] to remind him I would be stopping by this afternoon. He stated he would be home so I could come whenever. I advised I would be there within the next 15 minutes and he agreed.

## 2018-10-18 NOTE — Progress Notes (Signed)
Paramedicine Encounter    Patient ID: Andrew [name], male    DOB: [birth year], 84 y.o.   MRN: [id number]   Patient Care Team: [name], MD as PCP - General (Family Medicine) Larey Dresser, MD as Consulting Physician (Cardiology)  Patient Active Problem List   Diagnosis Date Noted  . Chronic respiratory failure with hypoxia ([place]) 09/16/2018  . Bilateral recurrent inguinal hernias 11/28/2017  . Calf pain 05/25/2016  . Carotid artery stenosis 01/12/2016  . Chronic systolic CHF (congestive heart failure) ([place]) 12/02/2015  . Cerebellar infarct ([place]) 10/16/2015  . 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 ([place]) 10/16/2015  . PVD (peripheral vascular disease) ([place])   . Hereditary and idiopathic peripheral neuropathy 10/02/2015  . Peripelvic (lymphatic) cyst   . Pernicious anemia 07/15/2015  . Bilateral renal cysts 07/12/2015  . Lung nodule, 17mm RML CT 07/12/15 07/12/2015  . Constipation 07/12/2015  . Kidney stone on right side 07/12/2015  . Stage 3 chronic kidney disease ([place]) 06/14/2015  . Chronic diastolic CHF (congestive heart failure) ([place]) 05/28/2015  . Paroxysmal atrial fibrillation ([place]) 05/28/2015  . AAA (abdominal aortic aneurysm) without rupture ([place]) 09/30/2014  . Left-sided low back pain without sciatica 07/21/2014  . Cardiomyopathy, ischemic 05/23/2014  . DM (diabetes mellitus), type 2 with renal complications ([place]) 70/35/0093  . CORONARY ATHEROSCLEROSIS NATIVE CORONARY ARTERY 11/09/2010  . Depression, major, single episode, complete remission ([place]) 03/18/2010  . Hypothyroidism 02/03/2009  . Osteoarthritis 05/27/2008  . Hyperlipidemia associated with type 2 diabetes mellitus ([place]) 06/14/2007  . Hypertension associated with diabetes ([place]) 06/14/2007  . GERD 06/14/2007    Current Outpatient Medications:  .  amiodarone (PACERONE) 200 MG tablet, Take 0.5 tablets (100 mg total) by mouth daily., Disp: 45 tablet, Rfl: 3 .  atorvastatin (LIPITOR) 80 MG  tablet, Take 1 tablet (80 mg total) by mouth daily., Disp: 30 tablet, Rfl: 6 .  carvedilol (COREG) 12.5 MG tablet, TAKE 1 TABLET BY MOUTH TWICE DAILY., Disp: 180 tablet, Rfl: 3 .  cilostazol (PLETAL) 100 MG tablet, TAKE 1 TABLET BY MOUTH TWICE DAILY., Disp: 180 tablet, Rfl: 3 .  ELIQUIS 2.5 MG TABS tablet, TAKE 1 TABLET BY MOUTH TWICE DAILY., Disp: 60 tablet, Rfl: 11 .  ezetimibe (ZETIA) 10 MG tablet, TAKE (1) TABLET DAILY., Disp: 90 tablet, Rfl: 0 .  FLUoxetine (PROZAC) 20 MG capsule, fluoxetine 20 mg capsule, Disp: , Rfl:  .  furosemide (LASIX) 80 MG tablet, Take 1.5 tablets (120 mg total) by mouth every morning AND 1 tablet (80 mg total) every evening., Disp: 75 tablet, Rfl: 6 .  hydrALAZINE (APRESOLINE) 50 MG tablet, Take 1 tablet (50 mg total) by mouth 3 (three) times daily., Disp: 90 tablet, Rfl: 3 .  HYDROcodone-acetaminophen (NORCO/VICODIN) 5-325 MG tablet, hydrocodone 5 mg-acetaminophen 325 mg tablet, Disp: , Rfl:  .  isosorbide mononitrate (IMDUR) 30 MG 24 hr tablet, Take 1 tablet (30 mg total) by mouth daily., Disp: 30 tablet, Rfl: 6 .  levothyroxine (SYNTHROID, LEVOTHROID) 125 MCG tablet, Take 1 tablet (125 mcg total) by mouth daily., Disp: 90 tablet, Rfl: 1 .  Multiple Vitamin (MULTIVITAMIN) tablet, Take 1 tablet by mouth daily., Disp: , Rfl:  .  potassium chloride SA (K-DUR,KLOR-CON) 20 MEQ tablet, Take 1 tablet (20 mEq total) by mouth daily., Disp: 90 tablet, Rfl: 3 .  temazepam (RESTORIL) 30 MG capsule, TAKE 1 CAPSULE AT BEDTIME AS NEEDED FOR SLEEP., Disp: 30 capsule, Rfl: 0 .  tiotropium (SPIRIVA HANDIHALER) 18 MCG inhalation capsule, Place  1 capsule (18 mcg total) into inhaler and inhale daily., Disp: 30 capsule, Rfl: 12 .  amoxicillin (AMOXIL) 500 MG capsule, amoxicillin 500 mg capsule, Disp: , Rfl:  .  glucose blood (ACCU-CHEK AVIVA PLUS) test strip, CHECK BLOOD SUGAR 4 TIMES A DAY., Disp: 100 each, Rfl: 11 .  Lancets MISC, Check blood sugar 4 times a day, Disp: 100 each, Rfl:  11 .  nitroGLYCERIN (NITROSTAT) 0.4 MG SL tablet, DISSOLVE 1 TABLET UNDER TONGUE AS NEEDED FOR CHEST PAIN,MAY REPEAT IN5 MINUTES FOR 2 DOSES., Disp: 25 tablet, Rfl: 3 .  Potassium Chloride ER 20 MEQ TBCR, Take 20 mEq by mouth daily., Disp: 30 tablet, Rfl: 3 .  traMADol (ULTRAM) 50 MG tablet, Take 1 tablet (50 mg total) by mouth every 6 (six) hours as needed. (Patient not taking: Reported on 09/20/2018), Disp: 12 tablet, Rfl: 0 .  UNABLE TO FIND, nifedipine ER 30 mg tablet,extended release, Disp: , Rfl:  Allergies  Allergen Reactions  . Metoprolol Shortness Of Breath, Palpitations and Other (See Comments)    Heart starts racing. Shallow breathing; pt states he also get skin irritation on his legs  . Metformin Hives and Swelling    On legs  . Metformin And Related Nausea And Vomiting      Social History   Socioeconomic History  . Marital status: Married    Spouse name: Not on file  . Number of children: Not on file  . Years of education: Not on file  . Highest education level: Not on file  Occupational History  . 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 Financial resource strain: Not on file  . Food insecurity:    Worry: Not on file    Inability: Not on file  . Transportation needs:    Medical: Not on file    Non-medical: Not on file  Tobacco Use  . Smoking status: Former Smoker    Packs/day: 1.50    Years: 35.00    Pack years: 52.50    Types: Cigarettes  . Smokeless tobacco: Never Used  . Tobacco comment: quit atleast 25 yrs ago, per pt  Substance and Sexual Activity  . Alcohol use: Yes    Alcohol/week: 3.0 standard drinks    Types: 3 Shots of liquor per week    Comment: socially  . Drug use: No  . Sexual activity: Yes  Lifestyle  . Physical activity:    Days per week: Not on file    Minutes per session: Not on file  . Stress: Not on file  Relationships  . Social connections:    Talks on phone: Not on file    Gets together: Not on file    Attends religious service:  Not on file    Active member of club or organization: Not on file    Attends meetings of clubs or organizations: Not on file    Relationship status: Not on file  . Intimate partner violence:    Fear of current or ex partner: Not on file    Emotionally abused: Not on file    Physically abused: Not on file    Forced sexual activity: Not on file  Other Topics Concern  . Not on file  Social History Narrative   Pt lives in [place] with spouse.   Retired from [employer].  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    Physical Exam  Constitutional: He is oriented to person, place, and time.  Cardiovascular: Normal  rate and regular rhythm.  Pulmonary/Chest: Effort normal and breath sounds normal.  Abdominal: Soft. Bowel sounds are normal.  Musculoskeletal: Normal range of motion. He exhibits no edema.  Neurological: He is alert and oriented to person, place, and time.  Skin: Skin is warm and dry.  Psychiatric: He has a normal mood and affect.        Future Appointments  Date Time Provider [place]  12/11/2018  4:00 PM Gardiner Barefoot, DPM [facility]  12/24/2018 11:40 AM Larey Dresser, MD [facility] None    BP 124/62 (BP Location: Left Arm, Patient Position: Sitting, Cuff Size: Large)   Pulse 78   Resp 16   Wt 191 lb (86.6 kg)   SpO2 90%   BMI 29.91 kg/m   Weight yesterday-191 lb Last visit weight- 194 lb  Mr Bozard was sen at home today and reported feeling well. He denied SOB, headache, dizziness or orthopnea. He reported being compliant with his medications over the past week and his weights have been stable. His medications were verified and his pillbox was refilled.   [name], EMT 10/18/18  ACTION: Home visit completed Next visit planned for 1 week

## 2018-10-22 ENCOUNTER — Other Ambulatory Visit (HOSPITAL_COMMUNITY): Payer: Self-pay | Admitting: Cardiology

## 2018-10-22 ENCOUNTER — Other Ambulatory Visit: Payer: Self-pay | Admitting: Family Medicine

## 2018-10-25 ENCOUNTER — Other Ambulatory Visit (HOSPITAL_COMMUNITY): Payer: Self-pay

## 2018-10-25 ENCOUNTER — Other Ambulatory Visit (HOSPITAL_COMMUNITY): Payer: Self-pay | Admitting: Pharmacist

## 2018-10-25 MED ORDER — FUROSEMIDE 80 MG PO TABS: ORAL_TABLET | ORAL | 5 refills | Status: DC

## 2018-10-25 NOTE — Progress Notes (Signed)
Paramedicine Encounter    Patient ID: Andrew [name], male    DOB: October 18, 1928, 84 y.o.   MRN: [id number]   Patient Care Team: [name], MD as PCP - General (Family Medicine) Larey Dresser, MD as Consulting Physician (Cardiology)  Patient Active Problem List   Diagnosis Date Noted  . Chronic respiratory failure with hypoxia ([place]) 09/16/2018  . Bilateral recurrent inguinal hernias 11/28/2017  . Calf pain 05/25/2016  . Carotid artery stenosis 01/12/2016  . Chronic systolic CHF (congestive heart failure) ([place]) 12/02/2015  . Cerebellar infarct ([place]) 10/16/2015  . 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 ([place]) 10/16/2015  . PVD (peripheral vascular disease) ([place])   . Hereditary and idiopathic peripheral neuropathy 10/02/2015  . Peripelvic (lymphatic) cyst   . Pernicious anemia 07/15/2015  . Bilateral renal cysts 07/12/2015  . Lung nodule, 70mm RML CT 07/12/15 07/12/2015  . Constipation 07/12/2015  . Kidney stone on right side 07/12/2015  . Stage 3 chronic kidney disease ([place]) 06/14/2015  . Chronic diastolic CHF (congestive heart failure) ([place]) 05/28/2015  . Paroxysmal atrial fibrillation ([place]) 05/28/2015  . AAA (abdominal aortic aneurysm) without rupture ([place]) 09/30/2014  . Left-sided low back pain without sciatica 07/21/2014  . Cardiomyopathy, ischemic 05/23/2014  . DM (diabetes mellitus), type 2 with renal complications ([place]) 46/96/2952  . CORONARY ATHEROSCLEROSIS NATIVE CORONARY ARTERY 11/09/2010  . Depression, major, single episode, complete remission ([place]) 03/18/2010  . Hypothyroidism 02/03/2009  . Osteoarthritis 05/27/2008  . Hyperlipidemia associated with type 2 diabetes mellitus ([place]) 06/14/2007  . Hypertension associated with diabetes ([place]) 06/14/2007  . GERD 06/14/2007    Current Outpatient Medications:  .  amiodarone (PACERONE) 200 MG tablet, Take 0.5 tablets (100 mg total) by mouth daily., Disp: 45 tablet, Rfl: 3 .  atorvastatin (LIPITOR) 80 MG  tablet, Take 1 tablet (80 mg total) by mouth daily., Disp: 30 tablet, Rfl: 6 .  carvedilol (COREG) 12.5 MG tablet, TAKE 1 TABLET BY MOUTH TWICE DAILY., Disp: 180 tablet, Rfl: 3 .  cilostazol (PLETAL) 100 MG tablet, TAKE 1 TABLET BY MOUTH TWICE DAILY., Disp: 180 tablet, Rfl: 3 .  ELIQUIS 2.5 MG TABS tablet, TAKE 1 TABLET BY MOUTH TWICE DAILY., Disp: 60 tablet, Rfl: 11 .  ezetimibe (ZETIA) 10 MG tablet, TAKE (1) TABLET DAILY., Disp: 90 tablet, Rfl: 0 .  FLUoxetine (PROZAC) 20 MG capsule, TAKE (1) CAPSULE DAILY., Disp: 90 capsule, Rfl: 0 .  furosemide (LASIX) 80 MG tablet, Take 1 and 1/2 tablets (120 mg) in the morning and 1 tablet (80 mg) in the afternoon, Disp: 75 tablet, Rfl: 5 .  hydrALAZINE (APRESOLINE) 50 MG tablet, TAKE 1 TABLET BY MOUTH 3 TIMES A DAY., Disp: 270 tablet, Rfl: 0 .  HYDROcodone-acetaminophen (NORCO/VICODIN) 5-325 MG tablet, hydrocodone 5 mg-acetaminophen 325 mg tablet, Disp: , Rfl:  .  isosorbide mononitrate (IMDUR) 30 MG 24 hr tablet, Take 1 tablet (30 mg total) by mouth daily., Disp: 30 tablet, Rfl: 6 .  levothyroxine (SYNTHROID, LEVOTHROID) 125 MCG tablet, TAKE 1 TABLET EACH DAY., Disp: 90 tablet, Rfl: 0 .  Multiple Vitamin (MULTIVITAMIN) tablet, Take 1 tablet by mouth daily., Disp: , Rfl:  .  potassium chloride SA (K-DUR,KLOR-CON) 20 MEQ tablet, Take 1 tablet (20 mEq total) by mouth daily., Disp: 90 tablet, Rfl: 3 .  temazepam (RESTORIL) 30 MG capsule, TAKE 1 CAPSULE AT BEDTIME AS NEEDED FOR SLEEP., Disp: 30 capsule, Rfl: 0 .  tiotropium (SPIRIVA HANDIHALER) 18 MCG inhalation capsule, Place 1 capsule (18 mcg total) into  inhaler and inhale daily., Disp: 30 capsule, Rfl: 12 .  amoxicillin (AMOXIL) 500 MG capsule, amoxicillin 500 mg capsule, Disp: , Rfl:  .  glucose blood (ACCU-CHEK AVIVA PLUS) test strip, CHECK BLOOD SUGAR 4 TIMES A DAY., Disp: 100 each, Rfl: 11 .  Lancets MISC, Check blood sugar 4 times a day, Disp: 100 each, Rfl: 11 .  nitroGLYCERIN (NITROSTAT) 0.4 MG SL  tablet, DISSOLVE 1 TABLET UNDER TONGUE AS NEEDED FOR CHEST PAIN,MAY REPEAT IN5 MINUTES FOR 2 DOSES. (Patient not taking: Reported on 10/25/2018), Disp: 25 tablet, Rfl: 3 .  Potassium Chloride ER 20 MEQ TBCR, Take 20 mEq by mouth daily., Disp: 30 tablet, Rfl: 3 .  traMADol (ULTRAM) 50 MG tablet, Take 1 tablet (50 mg total) by mouth every 6 (six) hours as needed. (Patient not taking: Reported on 09/20/2018), Disp: 12 tablet, Rfl: 0 .  UNABLE TO FIND, nifedipine ER 30 mg tablet,extended release, Disp: , Rfl:  Allergies  Allergen Reactions  . Metoprolol Shortness Of Breath, Palpitations and Other (See Comments)    Heart starts racing. Shallow breathing; pt states he also get skin irritation on his legs  . Metformin Hives and Swelling    On legs  . Metformin And Related Nausea And Vomiting      Social History   Socioeconomic History  . Marital status: Married    Spouse name: Not on file  . Number of children: Not on file  . Years of education: Not on file  . Highest education level: Not on file  Occupational History  . 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 Financial resource strain: Not on file  . Food insecurity:    Worry: Not on file    Inability: Not on file  . Transportation needs:    Medical: Not on file    Non-medical: Not on file  Tobacco Use  . Smoking status: Former Smoker    Packs/day: 1.50    Years: 35.00    Pack years: 52.50    Types: Cigarettes  . Smokeless tobacco: Never Used  . Tobacco comment: quit atleast 25 yrs ago, per pt  Substance and Sexual Activity  . Alcohol use: Yes    Alcohol/week: 3.0 standard drinks    Types: 3 Shots of liquor per week    Comment: socially  . Drug use: No  . Sexual activity: Yes  Lifestyle  . Physical activity:    Days per week: Not on file    Minutes per session: Not on file  . Stress: Not on file  Relationships  . Social connections:    Talks on phone: Not on file    Gets together: Not on file    Attends religious  service: Not on file    Active member of club or organization: Not on file    Attends meetings of clubs or organizations: Not on file    Relationship status: Not on file  . Intimate partner violence:    Fear of current or ex partner: Not on file    Emotionally abused: Not on file    Physically abused: Not on file    Forced sexual activity: Not on file  Other Topics Concern  . Not on file  Social History Narrative   Pt lives in [place] with spouse.   Retired from [employer].  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    Physical Exam  Constitutional: He is oriented to person, place, and time.  Cardiovascular: Normal  rate and regular rhythm.  Pulmonary/Chest: Effort normal and breath sounds normal.  Abdominal: Soft.  Musculoskeletal: Normal range of motion. He exhibits no edema.  Neurological: He is alert and oriented to person, place, and time.  Skin: Skin is warm and dry.  Psychiatric: He has a normal mood and affect.        Future Appointments  Date Time Provider [place]  12/11/2018  4:00 PM Gardiner Barefoot, DPM [facility]  12/24/2018 11:40 AM Larey Dresser, MD [facility] None    BP (!) 102/58 (BP Location: Left Arm, Patient Position: Sitting, Cuff Size: Large)   Pulse 64   Resp 18   Wt 192 lb 6.4 oz (87.3 kg)   SpO2 95%   BMI 30.13 kg/m   Weight yesterday- Did not weigh Last visit weight- 191 lb  Mr Knoop was seen at home today and reported feeling generally well. He denied SOB, headache, dizziness or orthopnea. He reported being compliant with his medications and his weight has been stable. His medications were verified and an error was found in his chart with regard to furosemide. I contacted Ileene Patrick, Pharm. D, at the [facility] clinic who was able to correct the charting error. His pillbox was refilled and the necessary refills were ordered.    [name], EMT 10/25/18  ACTION: Home  visit completed Next visit planned for 1 week

## 2018-10-26 ENCOUNTER — Telehealth (HOSPITAL_COMMUNITY): Payer: Self-pay

## 2018-10-26 ENCOUNTER — Ambulatory Visit: Payer: Self-pay

## 2018-10-26 NOTE — Telephone Encounter (Signed)
I returned a phone call to Mr and Mrs Gunderman. They picked up a prescription from the pharmacy and found that there were new instructions on the bottle. I explained that it was an error in ordering but his pillbox was correct and the issue has been resolved with the prescription. They were understanding.

## 2018-10-26 NOTE — Telephone Encounter (Signed)
Pt. Reports pain and swelling to right inguinal area. Noticed it one week ago. Denies any fever, nausea, vomiting. Appointment made for Monday. Will only see Dr. Jerline Pain - offered appointment for today.Instructed if pain and swelling worsen over the weekend to go to ED for evaluation.  Reason for Disposition . [1] New-onset hernia suspected (reducible bulge in groin or abdomen; non-tender) AND [2] NO pain or vomiting  Answer Assessment - Initial Assessment Questions 1. ONSET:  "When did this first appear?"     Noticed this 1 week ago 2. APPEARANCE: "What does it look like?"     Lump  3. SIZE: "How big is it?" (inches, cm or compare to coins, fruit)     Silver dollar 4. LOCATION: "Where exactly is the hernia located?"     Right 5. PATTERN: "Does the swelling come and go, or has it been constant since it started?"     Constant 6. PAIN: "Is there any pain?" If so, ask: "How bad is it?"  (Scale 1-10; or mild, moderate, severe)     5 7. DIAGNOSIS: "Have you been seen by a doctor for this?" "Did the doctor diagnose you as having a hernia?"     Unsure 8. OTHER SYMPTOMS: "Do you have any other symptoms?" (e.g., fever, abdominal pain, vomiting)     No 9. PREGNANCY: "Is there any chance you are pregnant?" "When was your last menstrual period?"     n/a  Protocols used: HERNIA-A-AH

## 2018-10-29 ENCOUNTER — Encounter: Payer: Self-pay | Admitting: Family Medicine

## 2018-10-29 ENCOUNTER — Ambulatory Visit: Payer: Medicare Other | Admitting: Family Medicine

## 2018-10-29 VITALS — BP 132/74 | HR 82 | Temp 97.5°F | Ht 67.0 in | Wt 194.0 lb

## 2018-10-29 DIAGNOSIS — R1909 Other intra-abdominal and pelvic swelling, mass and lump: Secondary | ICD-10-CM | POA: Diagnosis not present

## 2018-10-29 NOTE — Patient Instructions (Signed)
It was very nice to see you today!  I will refer you back to your general surgeon.  No other changes today.  Come back to see me in 3 months, or sooner as needed.  Take care, Dr Jerline Pain

## 2018-10-29 NOTE — Progress Notes (Signed)
   Subjective:  Andrew [name] is a 84 y.o. male who presents today with a chief complaint of groin pain.   HPI:  Groin Pain, new problem Started 3 weeks ago.  Pain is located in his right lower groin.  Stable at that time.  Worse with certain maneuvers including standing and coughing.  He has had a bulge in the area as well.  No vomiting.  No constipation or diarrhea.  No treatments tried.  ROS: Per HPI  Objective:  Physical Exam: BP 132/74 (BP Location: Left Arm, Patient Position: Sitting, Cuff Size: Normal)   Pulse 82   Temp (!) 97.5 F (36.4 C) (Oral)   Ht 5\' 7"  (1.702 m)   Wt 194 lb (88 kg)   SpO2 94%   BMI 30.38 kg/m   Gen: NAD, resting comfortably CV: RRR with no murmurs appreciated Pulm: NWOB, CTAB with no crackles, wheezes, or rhonchi GI: Normal bowel sounds present. Soft, Nontender, Nondistended. GU: Limited exam due to body habitus.  Approximately 2 to 3 cm bulge noted in right inguinal area, worsened by Valsalva maneuvers.  Assessment/Plan:  Right inguinal mass Concerning for right inguinal hernia.  No red flag signs or symptoms.  Will place referral back to his general surgery-had inguinal hernia repair around this time last year.  Discussed reasons return to care and seek emergent care.  Continue over-the-counter analgesics as needed.  Algis Greenhouse. Jerline Pain, MD 10/29/2018 3:31 PM

## 2018-11-01 ENCOUNTER — Other Ambulatory Visit (HOSPITAL_COMMUNITY): Payer: Self-pay

## 2018-11-01 ENCOUNTER — Telehealth (HOSPITAL_COMMUNITY): Payer: Self-pay

## 2018-11-01 NOTE — Telephone Encounter (Signed)
I called Andrew [name] to let her know I would be getting to their house around 14:00. She was understanding and agreeable.

## 2018-11-01 NOTE — Progress Notes (Signed)
Paramedicine Encounter    Patient ID: Andrew [name], male    DOB: 07-29-1928, 84 y.o.   MRN: [id number]   Patient Care Team: [name], MD as PCP - General (Family Medicine) Larey Dresser, MD as Consulting Physician (Cardiology)  Patient Active Problem List   Diagnosis Date Noted  . Chronic respiratory failure with hypoxia ([place]) 09/16/2018  . Bilateral recurrent inguinal hernias 11/28/2017  . Calf pain 05/25/2016  . Carotid artery stenosis 01/12/2016  . Chronic systolic CHF (congestive heart failure) ([place]) 12/02/2015  . Cerebellar infarct ([place]) 10/16/2015  . 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 ([place]) 10/16/2015  . PVD (peripheral vascular disease) ([place])   . Hereditary and idiopathic peripheral neuropathy 10/02/2015  . Peripelvic (lymphatic) cyst   . Pernicious anemia 07/15/2015  . Bilateral renal cysts 07/12/2015  . Lung nodule, 58mm RML CT 07/12/15 07/12/2015  . Constipation 07/12/2015  . Kidney stone on right side 07/12/2015  . Stage 3 chronic kidney disease ([place]) 06/14/2015  . Chronic diastolic CHF (congestive heart failure) ([place]) 05/28/2015  . Paroxysmal atrial fibrillation ([place]) 05/28/2015  . AAA (abdominal aortic aneurysm) without rupture ([place]) 09/30/2014  . Left-sided low back pain without sciatica 07/21/2014  . Cardiomyopathy, ischemic 05/23/2014  . DM (diabetes mellitus), type 2 with renal complications ([place]) 12/28/7251  . CORONARY ATHEROSCLEROSIS NATIVE CORONARY ARTERY 11/09/2010  . Depression, major, single episode, complete remission ([place]) 03/18/2010  . Hypothyroidism 02/03/2009  . Osteoarthritis 05/27/2008  . Hyperlipidemia associated with type 2 diabetes mellitus ([place]) 06/14/2007  . Hypertension associated with diabetes ([place]) 06/14/2007  . GERD 06/14/2007    Current Outpatient Medications:  .  amiodarone (PACERONE) 200 MG tablet, Take 0.5 tablets (100 mg total) by mouth daily., Disp: 45 tablet, Rfl: 3 .  atorvastatin (LIPITOR) 80 MG  tablet, Take 1 tablet (80 mg total) by mouth daily., Disp: 30 tablet, Rfl: 6 .  carvedilol (COREG) 12.5 MG tablet, TAKE 1 TABLET BY MOUTH TWICE DAILY., Disp: 180 tablet, Rfl: 3 .  cilostazol (PLETAL) 100 MG tablet, TAKE 1 TABLET BY MOUTH TWICE DAILY., Disp: 180 tablet, Rfl: 3 .  ELIQUIS 2.5 MG TABS tablet, TAKE 1 TABLET BY MOUTH TWICE DAILY., Disp: 60 tablet, Rfl: 11 .  ezetimibe (ZETIA) 10 MG tablet, TAKE (1) TABLET DAILY., Disp: 90 tablet, Rfl: 0 .  FLUoxetine (PROZAC) 20 MG capsule, TAKE (1) CAPSULE DAILY., Disp: 90 capsule, Rfl: 0 .  furosemide (LASIX) 80 MG tablet, Take 1 and 1/2 tablets (120 mg) in the morning and 1 tablet (80 mg) in the afternoon, Disp: 75 tablet, Rfl: 5 .  hydrALAZINE (APRESOLINE) 50 MG tablet, TAKE 1 TABLET BY MOUTH 3 TIMES A DAY., Disp: 270 tablet, Rfl: 0 .  HYDROcodone-acetaminophen (NORCO/VICODIN) 5-325 MG tablet, hydrocodone 5 mg-acetaminophen 325 mg tablet, Disp: , Rfl:  .  isosorbide mononitrate (IMDUR) 30 MG 24 hr tablet, Take 1 tablet (30 mg total) by mouth daily., Disp: 30 tablet, Rfl: 6 .  levothyroxine (SYNTHROID, LEVOTHROID) 125 MCG tablet, TAKE 1 TABLET EACH DAY., Disp: 90 tablet, Rfl: 0 .  Multiple Vitamin (MULTIVITAMIN) tablet, Take 1 tablet by mouth daily., Disp: , Rfl:  .  potassium chloride SA (K-DUR,KLOR-CON) 20 MEQ tablet, Take 1 tablet (20 mEq total) by mouth daily., Disp: 90 tablet, Rfl: 3 .  temazepam (RESTORIL) 30 MG capsule, TAKE 1 CAPSULE AT BEDTIME AS NEEDED FOR SLEEP., Disp: 30 capsule, Rfl: 0 .  tiotropium (SPIRIVA HANDIHALER) 18 MCG inhalation capsule, Place 1 capsule (18 mcg total) into  inhaler and inhale daily., Disp: 30 capsule, Rfl: 12 .  glucose blood (ACCU-CHEK AVIVA PLUS) test strip, CHECK BLOOD SUGAR 4 TIMES A DAY., Disp: 100 each, Rfl: 11 .  Lancets MISC, Check blood sugar 4 times a day, Disp: 100 each, Rfl: 11 .  nitroGLYCERIN (NITROSTAT) 0.4 MG SL tablet, DISSOLVE 1 TABLET UNDER TONGUE AS NEEDED FOR CHEST PAIN,MAY REPEAT IN5 MINUTES  FOR 2 DOSES. (Patient not taking: Reported on 11/01/2018), Disp: 25 tablet, Rfl: 3 .  Potassium Chloride ER 20 MEQ TBCR, Take 20 mEq by mouth daily., Disp: 30 tablet, Rfl: 3 .  traMADol (ULTRAM) 50 MG tablet, Take 1 tablet (50 mg total) by mouth every 6 (six) hours as needed. (Patient not taking: Reported on 11/01/2018), Disp: 12 tablet, Rfl: 0 .  UNABLE TO FIND, nifedipine ER 30 mg tablet,extended release, Disp: , Rfl:  Allergies  Allergen Reactions  . Metoprolol Shortness Of Breath, Palpitations and Other (See Comments)    Heart starts racing. Shallow breathing; pt states he also get skin irritation on his legs  . Metformin Hives and Swelling    On legs  . Metformin And Related Nausea And Vomiting      Social History   Socioeconomic History  . Marital status: Married    Spouse name: Not on file  . Number of children: Not on file  . Years of education: Not on file  . Highest education level: Not on file  Occupational History  . 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 Financial resource strain: Not on file  . Food insecurity:    Worry: Not on file    Inability: Not on file  . Transportation needs:    Medical: Not on file    Non-medical: Not on file  Tobacco Use  . Smoking status: Former Smoker    Packs/day: 1.50    Years: 35.00    Pack years: 52.50    Types: Cigarettes  . Smokeless tobacco: Never Used  . Tobacco comment: quit atleast 25 yrs ago, per pt  Substance and Sexual Activity  . Alcohol use: Yes    Alcohol/week: 3.0 standard drinks    Types: 3 Shots of liquor per week    Comment: socially  . Drug use: No  . Sexual activity: Yes  Lifestyle  . Physical activity:    Days per week: Not on file    Minutes per session: Not on file  . Stress: Not on file  Relationships  . Social connections:    Talks on phone: Not on file    Gets together: Not on file    Attends religious service: Not on file    Active member of club or organization: Not on file    Attends  meetings of clubs or organizations: Not on file    Relationship status: Not on file  . Intimate partner violence:    Fear of current or ex partner: Not on file    Emotionally abused: Not on file    Physically abused: Not on file    Forced sexual activity: Not on file  Other Topics Concern  . Not on file  Social History Narrative   Pt lives in [place] with spouse.   Retired from [employer].  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    Physical Exam  Constitutional: He is oriented to person, place, and time.  Cardiovascular: Normal rate and regular rhythm.  Pulmonary/Chest: Effort normal and breath sounds normal.  Abdominal: Soft.  Musculoskeletal: Normal range of motion. He exhibits no edema.  Neurological: He is alert and oriented to person, place, and time.  Skin: Skin is warm and dry.  Psychiatric: He has a normal mood and affect.        Future Appointments  Date Time Provider [place]  12/11/2018  4:00 PM Gardiner Barefoot, DPM [facility]  12/24/2018 11:40 AM Larey Dresser, MD [facility] None    BP (!) 100/58 (BP Location: Left Arm, Patient Position: Sitting, Cuff Size: Large)   Pulse 74   Resp 16   Wt 194 lb 8 oz (88.2 kg)   SpO2 91%   BMI 30.46 kg/m   Weight yesterday- Can't recall Last visit weight- 192 lb  Mr Knierim was seen at home today and reported feeling well. He denied SOB, headache, dizziness or increased orthopnea. He reported he had been complaint with his medications with the exception of yesterday, when he forgot to take his afternoon and evening doses. His medications were verified and his pillbox was refilled. All necessary refills were called in and the pharmacy advised they were already in the process of being refilled. Ms Cogburn stated she would pick them up tomorrow.   [name], EMT 11/01/18  ACTION: Home visit completed Next visit planned for 1 week

## 2018-11-08 ENCOUNTER — Other Ambulatory Visit (HOSPITAL_COMMUNITY): Payer: Self-pay

## 2018-11-08 NOTE — Progress Notes (Signed)
Paramedicine Encounter    Patient ID: Andrew [name], male    DOB: 01-Jun-1928, 84 y.o.   MRN: [id number]   Patient Care Team: [name], MD as PCP - General (Family Medicine) Larey Dresser, MD as Consulting Physician (Cardiology)  Patient Active Problem List   Diagnosis Date Noted  . Chronic respiratory failure with hypoxia ([place]) 09/16/2018  . Bilateral recurrent inguinal hernias 11/28/2017  . Calf pain 05/25/2016  . Carotid artery stenosis 01/12/2016  . Chronic systolic CHF (congestive heart failure) ([place]) 12/02/2015  . Cerebellar infarct ([place]) 10/16/2015  . 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 ([place]) 10/16/2015  . PVD (peripheral vascular disease) ([place])   . Hereditary and idiopathic peripheral neuropathy 10/02/2015  . Peripelvic (lymphatic) cyst   . Pernicious anemia 07/15/2015  . Bilateral renal cysts 07/12/2015  . Lung nodule, 76mm RML CT 07/12/15 07/12/2015  . Constipation 07/12/2015  . Kidney stone on right side 07/12/2015  . Stage 3 chronic kidney disease ([place]) 06/14/2015  . Chronic diastolic CHF (congestive heart failure) ([place]) 05/28/2015  . Paroxysmal atrial fibrillation ([place]) 05/28/2015  . AAA (abdominal aortic aneurysm) without rupture ([place]) 09/30/2014  . Left-sided low back pain without sciatica 07/21/2014  . Cardiomyopathy, ischemic 05/23/2014  . DM (diabetes mellitus), type 2 with renal complications ([place]) 95/63/8756  . CORONARY ATHEROSCLEROSIS NATIVE CORONARY ARTERY 11/09/2010  . Depression, major, single episode, complete remission ([place]) 03/18/2010  . Hypothyroidism 02/03/2009  . Osteoarthritis 05/27/2008  . Hyperlipidemia associated with type 2 diabetes mellitus ([place]) 06/14/2007  . Hypertension associated with diabetes ([place]) 06/14/2007  . GERD 06/14/2007    Current Outpatient Medications:  .  amiodarone (PACERONE) 200 MG tablet, Take 0.5 tablets (100 mg total) by mouth daily., Disp: 45 tablet, Rfl: 3 .  atorvastatin (LIPITOR) 80 MG  tablet, Take 1 tablet (80 mg total) by mouth daily., Disp: 30 tablet, Rfl: 6 .  carvedilol (COREG) 12.5 MG tablet, TAKE 1 TABLET BY MOUTH TWICE DAILY., Disp: 180 tablet, Rfl: 3 .  cilostazol (PLETAL) 100 MG tablet, TAKE 1 TABLET BY MOUTH TWICE DAILY., Disp: 180 tablet, Rfl: 3 .  ELIQUIS 2.5 MG TABS tablet, TAKE 1 TABLET BY MOUTH TWICE DAILY., Disp: 60 tablet, Rfl: 11 .  ezetimibe (ZETIA) 10 MG tablet, TAKE (1) TABLET DAILY., Disp: 90 tablet, Rfl: 0 .  FLUoxetine (PROZAC) 20 MG capsule, TAKE (1) CAPSULE DAILY., Disp: 90 capsule, Rfl: 0 .  furosemide (LASIX) 80 MG tablet, Take 1 and 1/2 tablets (120 mg) in the morning and 1 tablet (80 mg) in the afternoon, Disp: 75 tablet, Rfl: 5 .  hydrALAZINE (APRESOLINE) 50 MG tablet, TAKE 1 TABLET BY MOUTH 3 TIMES A DAY., Disp: 270 tablet, Rfl: 0 .  HYDROcodone-acetaminophen (NORCO/VICODIN) 5-325 MG tablet, hydrocodone 5 mg-acetaminophen 325 mg tablet, Disp: , Rfl:  .  isosorbide mononitrate (IMDUR) 30 MG 24 hr tablet, Take 1 tablet (30 mg total) by mouth daily., Disp: 30 tablet, Rfl: 6 .  levothyroxine (SYNTHROID, LEVOTHROID) 125 MCG tablet, TAKE 1 TABLET EACH DAY., Disp: 90 tablet, Rfl: 0 .  Multiple Vitamin (MULTIVITAMIN) tablet, Take 1 tablet by mouth daily., Disp: , Rfl:  .  potassium chloride SA (K-DUR,KLOR-CON) 20 MEQ tablet, Take 1 tablet (20 mEq total) by mouth daily., Disp: 90 tablet, Rfl: 3 .  temazepam (RESTORIL) 30 MG capsule, TAKE 1 CAPSULE AT BEDTIME AS NEEDED FOR SLEEP., Disp: 30 capsule, Rfl: 0 .  tiotropium (SPIRIVA HANDIHALER) 18 MCG inhalation capsule, Place 1 capsule (18 mcg total) into  inhaler and inhale daily., Disp: 30 capsule, Rfl: 12 .  glucose blood (ACCU-CHEK AVIVA PLUS) test strip, CHECK BLOOD SUGAR 4 TIMES A DAY., Disp: 100 each, Rfl: 11 .  Lancets MISC, Check blood sugar 4 times a day, Disp: 100 each, Rfl: 11 .  nitroGLYCERIN (NITROSTAT) 0.4 MG SL tablet, DISSOLVE 1 TABLET UNDER TONGUE AS NEEDED FOR CHEST PAIN,MAY REPEAT IN5 MINUTES  FOR 2 DOSES. (Patient not taking: Reported on 11/01/2018), Disp: 25 tablet, Rfl: 3 .  Potassium Chloride ER 20 MEQ TBCR, Take 20 mEq by mouth daily., Disp: 30 tablet, Rfl: 3 .  traMADol (ULTRAM) 50 MG tablet, Take 1 tablet (50 mg total) by mouth every 6 (six) hours as needed. (Patient not taking: Reported on 11/01/2018), Disp: 12 tablet, Rfl: 0 .  UNABLE TO FIND, nifedipine ER 30 mg tablet,extended release, Disp: , Rfl:  Allergies  Allergen Reactions  . Metoprolol Shortness Of Breath, Palpitations and Other (See Comments)    Heart starts racing. Shallow breathing; pt states he also get skin irritation on his legs  . Metformin Hives and Swelling    On legs  . Metformin And Related Nausea And Vomiting      Social History   Socioeconomic History  . Marital status: Married    Spouse name: Not on file  . Number of children: Not on file  . Years of education: Not on file  . Highest education level: Not on file  Occupational History  . 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 Financial resource strain: Not on file  . Food insecurity:    Worry: Not on file    Inability: Not on file  . Transportation needs:    Medical: Not on file    Non-medical: Not on file  Tobacco Use  . Smoking status: Former Smoker    Packs/day: 1.50    Years: 35.00    Pack years: 52.50    Types: Cigarettes  . Smokeless tobacco: Never Used  . Tobacco comment: quit atleast 25 yrs ago, per pt  Substance and Sexual Activity  . Alcohol use: Yes    Alcohol/week: 3.0 standard drinks    Types: 3 Shots of liquor per week    Comment: socially  . Drug use: No  . Sexual activity: Yes  Lifestyle  . Physical activity:    Days per week: Not on file    Minutes per session: Not on file  . Stress: Not on file  Relationships  . Social connections:    Talks on phone: Not on file    Gets together: Not on file    Attends religious service: Not on file    Active member of club or organization: Not on file    Attends  meetings of clubs or organizations: Not on file    Relationship status: Not on file  . Intimate partner violence:    Fear of current or ex partner: Not on file    Emotionally abused: Not on file    Physically abused: Not on file    Forced sexual activity: Not on file  Other Topics Concern  . Not on file  Social History Narrative   Pt lives in [place] with spouse.   Retired from [employer].  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    Physical Exam  Constitutional: He is oriented to person, place, and time.  Cardiovascular: Normal rate and regular rhythm.  Pulmonary/Chest: Effort normal and breath sounds normal.  Abdominal: Soft.  Musculoskeletal: Normal range of motion. He exhibits no edema.  Neurological: He is alert and oriented to person, place, and time.  Skin: Skin is warm and dry.  Psychiatric: He has a normal mood and affect.        Future Appointments  Date Time Provider [place]  12/11/2018  4:00 PM Gardiner Barefoot, DPM [facility]  12/24/2018 11:40 AM Larey Dresser, MD [facility] None    BP 126/68 (BP Location: Left Arm, Patient Position: Sitting, Cuff Size: Large)   Pulse 64   Resp 16   Wt 194 lb (88 kg)   SpO2 94%   BMI 30.38 kg/m   Weight yesterday-194 lb Last visit weight- 194.5 lb  Mr Ketcher was seen at home today and reported feeling well. He denied chest pain, SOB, headache, dizziness or orthopnea. He has been compliant with his medications for the past week and reported his weights have been stable. His medications were verified and his pillbox was refilled.   [name], EMT 11/08/18  ACTION: Home visit completed Next visit planned for 1 week

## 2018-11-09 ENCOUNTER — Other Ambulatory Visit: Payer: Self-pay | Admitting: Surgery

## 2018-11-09 DIAGNOSIS — R1031 Right lower quadrant pain: Secondary | ICD-10-CM

## 2018-11-15 ENCOUNTER — Other Ambulatory Visit: Payer: Self-pay | Admitting: Surgery

## 2018-11-15 ENCOUNTER — Ambulatory Visit
Admission: RE | Admit: 2018-11-15 | Discharge: 2018-11-15 | Disposition: A | Discharge status: Home / Self Care | Payer: Medicare Other | Source: Ambulatory Visit | Attending: Surgery | Admitting: Surgery

## 2018-11-15 ENCOUNTER — Other Ambulatory Visit (HOSPITAL_COMMUNITY): Payer: Self-pay

## 2018-11-15 DIAGNOSIS — R1031 Right lower quadrant pain: Secondary | ICD-10-CM

## 2018-11-15 NOTE — Progress Notes (Signed)
Paramedicine Encounter    Patient ID: TEYON ODETTE, male    DOB: [birth year], 84 y.o.   MRN: [id number]   Patient Care Team: [name], MD as PCP - General (Family Medicine) Larey Dresser, MD as Consulting Physician (Cardiology)  Patient Active Problem List   Diagnosis Date Noted  . Chronic respiratory failure with hypoxia ([place]) 09/16/2018  . Bilateral recurrent inguinal hernias 11/28/2017  . Calf pain 05/25/2016  . Carotid artery stenosis 01/12/2016  . Chronic systolic CHF (congestive heart failure) ([place]) 12/02/2015  . Cerebellar infarct ([place]) 10/16/2015  . 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 ([place]) 10/16/2015  . PVD (peripheral vascular disease) ([place])   . Hereditary and idiopathic peripheral neuropathy 10/02/2015  . Peripelvic (lymphatic) cyst   . Pernicious anemia 07/15/2015  . Bilateral renal cysts 07/12/2015  . Lung nodule, 23mm RML CT 07/12/15 07/12/2015  . Constipation 07/12/2015  . Kidney stone on right side 07/12/2015  . Stage 3 chronic kidney disease ([place]) 06/14/2015  . Chronic diastolic CHF (congestive heart failure) ([place]) 05/28/2015  . Paroxysmal atrial fibrillation ([place]) 05/28/2015  . AAA (abdominal aortic aneurysm) without rupture ([place]) 09/30/2014  . Left-sided low back pain without sciatica 07/21/2014  . Cardiomyopathy, ischemic 05/23/2014  . DM (diabetes mellitus), type 2 with renal complications ([place]) 48/12/6551  . CORONARY ATHEROSCLEROSIS NATIVE CORONARY ARTERY 11/09/2010  . Depression, major, single episode, complete remission ([place]) 03/18/2010  . Hypothyroidism 02/03/2009  . Osteoarthritis 05/27/2008  . Hyperlipidemia associated with type 2 diabetes mellitus ([place]) 06/14/2007  . Hypertension associated with diabetes ([place]) 06/14/2007  . GERD 06/14/2007    Current Outpatient Medications:  .  amiodarone (PACERONE) 200 MG tablet, Take 0.5 tablets (100 mg total) by mouth daily., Disp: 45 tablet, Rfl: 3 .  atorvastatin (LIPITOR) 80 MG  tablet, Take 1 tablet (80 mg total) by mouth daily., Disp: 30 tablet, Rfl: 6 .  carvedilol (COREG) 12.5 MG tablet, TAKE 1 TABLET BY MOUTH TWICE DAILY., Disp: 180 tablet, Rfl: 3 .  cilostazol (PLETAL) 100 MG tablet, TAKE 1 TABLET BY MOUTH TWICE DAILY., Disp: 180 tablet, Rfl: 3 .  ELIQUIS 2.5 MG TABS tablet, TAKE 1 TABLET BY MOUTH TWICE DAILY., Disp: 60 tablet, Rfl: 11 .  ezetimibe (ZETIA) 10 MG tablet, TAKE (1) TABLET DAILY., Disp: 90 tablet, Rfl: 0 .  FLUoxetine (PROZAC) 20 MG capsule, TAKE (1) CAPSULE DAILY., Disp: 90 capsule, Rfl: 0 .  furosemide (LASIX) 80 MG tablet, Take 1 and 1/2 tablets (120 mg) in the morning and 1 tablet (80 mg) in the afternoon, Disp: 75 tablet, Rfl: 5 .  hydrALAZINE (APRESOLINE) 50 MG tablet, TAKE 1 TABLET BY MOUTH 3 TIMES A DAY., Disp: 270 tablet, Rfl: 0 .  HYDROcodone-acetaminophen (NORCO/VICODIN) 5-325 MG tablet, hydrocodone 5 mg-acetaminophen 325 mg tablet, Disp: , Rfl:  .  isosorbide mononitrate (IMDUR) 30 MG 24 hr tablet, Take 1 tablet (30 mg total) by mouth daily., Disp: 30 tablet, Rfl: 6 .  levothyroxine (SYNTHROID, LEVOTHROID) 125 MCG tablet, TAKE 1 TABLET EACH DAY., Disp: 90 tablet, Rfl: 0 .  Multiple Vitamin (MULTIVITAMIN) tablet, Take 1 tablet by mouth daily., Disp: , Rfl:  .  potassium chloride SA (K-DUR,KLOR-CON) 20 MEQ tablet, Take 1 tablet (20 mEq total) by mouth daily., Disp: 90 tablet, Rfl: 3 .  temazepam (RESTORIL) 30 MG capsule, TAKE 1 CAPSULE AT BEDTIME AS NEEDED FOR SLEEP., Disp: 30 capsule, Rfl: 0 .  tiotropium (SPIRIVA HANDIHALER) 18 MCG inhalation capsule, Place 1 capsule (18 mcg total) into  inhaler and inhale daily., Disp: 30 capsule, Rfl: 12 .  glucose blood (ACCU-CHEK AVIVA PLUS) test strip, CHECK BLOOD SUGAR 4 TIMES A DAY., Disp: 100 each, Rfl: 11 .  Lancets MISC, Check blood sugar 4 times a day, Disp: 100 each, Rfl: 11 .  nitroGLYCERIN (NITROSTAT) 0.4 MG SL tablet, DISSOLVE 1 TABLET UNDER TONGUE AS NEEDED FOR CHEST PAIN,MAY REPEAT IN5 MINUTES  FOR 2 DOSES. (Patient not taking: Reported on 11/01/2018), Disp: 25 tablet, Rfl: 3 .  Potassium Chloride ER 20 MEQ TBCR, Take 20 mEq by mouth daily., Disp: 30 tablet, Rfl: 3 .  traMADol (ULTRAM) 50 MG tablet, Take 1 tablet (50 mg total) by mouth every 6 (six) hours as needed. (Patient not taking: Reported on 11/01/2018), Disp: 12 tablet, Rfl: 0 .  UNABLE TO FIND, nifedipine ER 30 mg tablet,extended release, Disp: , Rfl:  Allergies  Allergen Reactions  . Metoprolol Shortness Of Breath, Palpitations and Other (See Comments)    Heart starts racing. Shallow breathing; pt states he also get skin irritation on his legs  . Metformin Hives and Swelling    On legs  . Metformin And Related Nausea And Vomiting      Social History   Socioeconomic History  . Marital status: Married    Spouse name: Not on file  . Number of children: Not on file  . Years of education: Not on file  . Highest education level: Not on file  Occupational History  . 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 Financial resource strain: Not on file  . Food insecurity:    Worry: Not on file    Inability: Not on file  . Transportation needs:    Medical: Not on file    Non-medical: Not on file  Tobacco Use  . Smoking status: Former Smoker    Packs/day: 1.50    Years: 35.00    Pack years: 52.50    Types: Cigarettes  . Smokeless tobacco: Never Used  . Tobacco comment: quit atleast 25 yrs ago, per pt  Substance and Sexual Activity  . Alcohol use: Yes    Alcohol/week: 3.0 standard drinks    Types: 3 Shots of liquor per week    Comment: socially  . Drug use: No  . Sexual activity: Yes  Lifestyle  . Physical activity:    Days per week: Not on file    Minutes per session: Not on file  . Stress: Not on file  Relationships  . Social connections:    Talks on phone: Not on file    Gets together: Not on file    Attends religious service: Not on file    Active member of club or organization: Not on file    Attends  meetings of clubs or organizations: Not on file    Relationship status: Not on file  . Intimate partner violence:    Fear of current or ex partner: Not on file    Emotionally abused: Not on file    Physically abused: Not on file    Forced sexual activity: Not on file  Other Topics Concern  . Not on file  Social History Narrative   Pt lives in [place] with spouse.   Retired from [employer].  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    Physical Exam  Constitutional: He is oriented to person, place, and time.  Cardiovascular: Normal rate and regular rhythm.  Pulmonary/Chest: Effort normal. No respiratory distress. He has rhonchi in  the right lower field and the left lower field.  Abdominal: Soft.  Musculoskeletal: Normal range of motion. He exhibits no edema.  Neurological: He is alert and oriented to person, place, and time.  Skin: Skin is warm and dry.  Psychiatric: He has a normal mood and affect.        Future Appointments  Date Time Provider [place]  12/11/2018  4:00 PM Gardiner Barefoot, DPM [facility]  12/24/2018 11:40 AM Larey Dresser, MD [facility] None    BP 100/60 (BP Location: Left Arm, Patient Position: Sitting, Cuff Size: Normal)   Pulse 74   Resp 18   Wt 192 lb (87.1 kg)   SpO2 (!) 88%   BMI 30.07 kg/m   Weight yesterday- 192 lb Last visit weight- 193 lb  Mr Skillen was seen at home today and rpeorted feeling well. He denied chest pain, SOB, headache, dizziness or orthopnea. He stated he has been compliant with his medications however a few doses had been missed over the week. He reported his weights have been stable. He was noted to have a lower than normal oxygen saturation at 88% so he was instructed to use his inhaler and incentive spirometer. He expressed understanding and was agreeable. His medications were verified and his pillbox was refilled.   [name],  EMT 11/15/18  ACTION: Home visit completed Next visit planned for 1 week

## 2018-11-21 ENCOUNTER — Other Ambulatory Visit (HOSPITAL_COMMUNITY): Payer: Self-pay

## 2018-11-21 NOTE — Progress Notes (Signed)
Paramedicine Encounter    Patient ID: Andrew [name], male    DOB: 28-Sep-1928, 84 y.o.   MRN: [id number]   Patient Care Team: [name], MD as PCP - General (Family Medicine) Larey Dresser, MD as Consulting Physician (Cardiology)  Patient Active Problem List   Diagnosis Date Noted  . Chronic respiratory failure with hypoxia ([place]) 09/16/2018  . Bilateral recurrent inguinal hernias 11/28/2017  . Calf pain 05/25/2016  . Carotid artery stenosis 01/12/2016  . Chronic systolic CHF (congestive heart failure) ([place]) 12/02/2015  . Cerebellar infarct ([place]) 10/16/2015  . 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 ([place]) 10/16/2015  . PVD (peripheral vascular disease) ([place])   . Hereditary and idiopathic peripheral neuropathy 10/02/2015  . Peripelvic (lymphatic) cyst   . Pernicious anemia 07/15/2015  . Bilateral renal cysts 07/12/2015  . Lung nodule, 53mm RML CT 07/12/15 07/12/2015  . Constipation 07/12/2015  . Kidney stone on right side 07/12/2015  . Stage 3 chronic kidney disease ([place]) 06/14/2015  . Chronic diastolic CHF (congestive heart failure) ([place]) 05/28/2015  . Paroxysmal atrial fibrillation ([place]) 05/28/2015  . AAA (abdominal aortic aneurysm) without rupture ([place]) 09/30/2014  . Left-sided low back pain without sciatica 07/21/2014  . Cardiomyopathy, ischemic 05/23/2014  . DM (diabetes mellitus), type 2 with renal complications ([place]) 71/24/5809  . CORONARY ATHEROSCLEROSIS NATIVE CORONARY ARTERY 11/09/2010  . Depression, major, single episode, complete remission ([place]) 03/18/2010  . Hypothyroidism 02/03/2009  . Osteoarthritis 05/27/2008  . Hyperlipidemia associated with type 2 diabetes mellitus ([place]) 06/14/2007  . Hypertension associated with diabetes ([place]) 06/14/2007  . GERD 06/14/2007    Current Outpatient Medications:  .  amiodarone (PACERONE) 200 MG tablet, Take 0.5 tablets (100 mg total) by mouth daily., Disp: 45 tablet, Rfl: 3 .  atorvastatin (LIPITOR) 80 MG  tablet, Take 1 tablet (80 mg total) by mouth daily., Disp: 30 tablet, Rfl: 6 .  carvedilol (COREG) 12.5 MG tablet, TAKE 1 TABLET BY MOUTH TWICE DAILY., Disp: 180 tablet, Rfl: 3 .  cilostazol (PLETAL) 100 MG tablet, TAKE 1 TABLET BY MOUTH TWICE DAILY., Disp: 180 tablet, Rfl: 3 .  ELIQUIS 2.5 MG TABS tablet, TAKE 1 TABLET BY MOUTH TWICE DAILY., Disp: 60 tablet, Rfl: 11 .  ezetimibe (ZETIA) 10 MG tablet, TAKE (1) TABLET DAILY., Disp: 90 tablet, Rfl: 0 .  FLUoxetine (PROZAC) 20 MG capsule, TAKE (1) CAPSULE DAILY., Disp: 90 capsule, Rfl: 0 .  furosemide (LASIX) 80 MG tablet, Take 1 and 1/2 tablets (120 mg) in the morning and 1 tablet (80 mg) in the afternoon, Disp: 75 tablet, Rfl: 5 .  glucose blood (ACCU-CHEK AVIVA PLUS) test strip, CHECK BLOOD SUGAR 4 TIMES A DAY., Disp: 100 each, Rfl: 11 .  hydrALAZINE (APRESOLINE) 50 MG tablet, TAKE 1 TABLET BY MOUTH 3 TIMES A DAY., Disp: 270 tablet, Rfl: 0 .  HYDROcodone-acetaminophen (NORCO/VICODIN) 5-325 MG tablet, hydrocodone 5 mg-acetaminophen 325 mg tablet, Disp: , Rfl:  .  isosorbide mononitrate (IMDUR) 30 MG 24 hr tablet, Take 1 tablet (30 mg total) by mouth daily., Disp: 30 tablet, Rfl: 6 .  levothyroxine (SYNTHROID, LEVOTHROID) 125 MCG tablet, TAKE 1 TABLET EACH DAY., Disp: 90 tablet, Rfl: 0 .  Multiple Vitamin (MULTIVITAMIN) tablet, Take 1 tablet by mouth daily., Disp: , Rfl:  .  potassium chloride SA (K-DUR,KLOR-CON) 20 MEQ tablet, Take 1 tablet (20 mEq total) by mouth daily., Disp: 90 tablet, Rfl: 3 .  temazepam (RESTORIL) 30 MG capsule, TAKE 1 CAPSULE AT BEDTIME AS NEEDED FOR SLEEP.,  Disp: 30 capsule, Rfl: 0 .  tiotropium (SPIRIVA HANDIHALER) 18 MCG inhalation capsule, Place 1 capsule (18 mcg total) into inhaler and inhale daily., Disp: 30 capsule, Rfl: 12 .  Lancets MISC, Check blood sugar 4 times a day, Disp: 100 each, Rfl: 11 .  nitroGLYCERIN (NITROSTAT) 0.4 MG SL tablet, DISSOLVE 1 TABLET UNDER TONGUE AS NEEDED FOR CHEST PAIN,MAY REPEAT IN5 MINUTES  FOR 2 DOSES. (Patient not taking: Reported on 11/01/2018), Disp: 25 tablet, Rfl: 3 .  Potassium Chloride ER 20 MEQ TBCR, Take 20 mEq by mouth daily., Disp: 30 tablet, Rfl: 3 .  traMADol (ULTRAM) 50 MG tablet, Take 1 tablet (50 mg total) by mouth every 6 (six) hours as needed. (Patient not taking: Reported on 11/01/2018), Disp: 12 tablet, Rfl: 0 .  UNABLE TO FIND, nifedipine ER 30 mg tablet,extended release, Disp: , Rfl:  Allergies  Allergen Reactions  . Metoprolol Shortness Of Breath, Palpitations and Other (See Comments)    Heart starts racing. Shallow breathing; pt states he also get skin irritation on his legs  . Metformin Hives and Swelling    On legs  . Metformin And Related Nausea And Vomiting      Social History   Socioeconomic History  . Marital status: Married    Spouse name: Not on file  . Number of children: Not on file  . Years of education: Not on file  . Highest education level: Not on file  Occupational History  . 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 Financial resource strain: Not on file  . Food insecurity:    Worry: Not on file    Inability: Not on file  . Transportation needs:    Medical: Not on file    Non-medical: Not on file  Tobacco Use  . Smoking status: Former Smoker    Packs/day: 1.50    Years: 35.00    Pack years: 52.50    Types: Cigarettes  . Smokeless tobacco: Never Used  . Tobacco comment: quit atleast 25 yrs ago, per pt  Substance and Sexual Activity  . Alcohol use: Yes    Alcohol/week: 3.0 standard drinks    Types: 3 Shots of liquor per week    Comment: socially  . Drug use: No  . Sexual activity: Yes  Lifestyle  . Physical activity:    Days per week: Not on file    Minutes per session: Not on file  . Stress: Not on file  Relationships  . Social connections:    Talks on phone: Not on file    Gets together: Not on file    Attends religious service: Not on file    Active member of club or organization: Not on file    Attends  meetings of clubs or organizations: Not on file    Relationship status: Not on file  . Intimate partner violence:    Fear of current or ex partner: Not on file    Emotionally abused: Not on file    Physically abused: Not on file    Forced sexual activity: Not on file  Other Topics Concern  . Not on file  Social History Narrative   Pt lives in [place] with spouse.   Retired from [employer].  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    Physical Exam  Constitutional: He is oriented to person, place, and time.  Cardiovascular: Normal rate and regular rhythm.  Pulmonary/Chest: Effort normal and breath sounds normal.  Musculoskeletal: Normal  range of motion. He exhibits no edema.  Neurological: He is alert and oriented to person, place, and time.  Skin: Skin is warm and dry.  Psychiatric: He has a normal mood and affect.        Future Appointments  Date Time Provider [place]  12/11/2018  4:00 PM Gardiner Barefoot, DPM [facility]  12/24/2018 11:40 AM Larey Dresser, MD [facility] None    BP 136/68 (BP Location: Left Arm, Patient Position: Sitting, Cuff Size: Normal)   Pulse 76   Resp 18   Wt 191 lb (86.6 kg)   SpO2 93%   BMI 29.91 kg/m   Weight yesterday- 191 lb Last visit weight- 192 lb   Mr Strassman was seen at home today and reported feeling well. He denied chest pain, SOB, headache, dizziness or orthopnea. He reported being mostly compliant with his medications, though he had missed 3 slots of his medications over the past week. His weight was stable since our last visit. His medications were verified and his pillbox was refilled.  [name], EMT 11/21/18  ACTION: Home visit completed Next visit planned for 1 week

## 2018-11-26 NOTE — Progress Notes (Signed)
Please call the patient and let them know that their CT scan showed that he has small amounts of fat at each inguinal canal, but no sign of his intestines being involved.  At his age, if the discomfort is getting better, we should hold off any further surgery.  I can recheck him in January.

## 2018-11-28 ENCOUNTER — Other Ambulatory Visit (HOSPITAL_COMMUNITY): Payer: Self-pay

## 2018-11-28 NOTE — Progress Notes (Signed)
Paramedicine Encounter    Patient ID: Andrew [name], male    DOB: Jan 23, 1928, 84 y.o.   MRN: [id number]   Patient Care Team: [name], MD as PCP - General (Family Medicine) Larey Dresser, MD as Consulting Physician (Cardiology)  Patient Active Problem List   Diagnosis Date Noted  . Chronic respiratory failure with hypoxia ([place]) 09/16/2018  . Bilateral recurrent inguinal hernias 11/28/2017  . Calf pain 05/25/2016  . Carotid artery stenosis 01/12/2016  . Chronic systolic CHF (congestive heart failure) ([place]) 12/02/2015  . Cerebellar infarct ([place]) 10/16/2015  . 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 ([place]) 10/16/2015  . PVD (peripheral vascular disease) ([employer])   . Hereditary and idiopathic peripheral neuropathy 10/02/2015  . Peripelvic (lymphatic) cyst   . Pernicious anemia 07/15/2015  . Bilateral renal cysts 07/12/2015  . Lung nodule, 76mm RML CT 07/12/15 07/12/2015  . Constipation 07/12/2015  . Kidney stone on right side 07/12/2015  . Stage 3 chronic kidney disease ([place]) 06/14/2015  . Chronic diastolic CHF (congestive heart failure) ([place]) 05/28/2015  . Paroxysmal atrial fibrillation ([place]) 05/28/2015  . AAA (abdominal aortic aneurysm) without rupture ([place]) 09/30/2014  . Left-sided low back pain without sciatica 07/21/2014  . Cardiomyopathy, ischemic 05/23/2014  . DM (diabetes mellitus), type 2 with renal complications ([place]) 32/35/5732  . CORONARY ATHEROSCLEROSIS NATIVE CORONARY ARTERY 11/09/2010  . Depression, major, single episode, complete remission ([place]) 03/18/2010  . Hypothyroidism 02/03/2009  . Osteoarthritis 05/27/2008  . Hyperlipidemia associated with type 2 diabetes mellitus ([place]) 06/14/2007  . Hypertension associated with diabetes ([place]) 06/14/2007  . GERD 06/14/2007    Current Outpatient Medications:  .  amiodarone (PACERONE) 200 MG tablet, Take 0.5 tablets (100 mg total) by mouth daily., Disp: 45 tablet, Rfl: 3 .  atorvastatin (LIPITOR) 80 MG  tablet, Take 1 tablet (80 mg total) by mouth daily., Disp: 30 tablet, Rfl: 6 .  carvedilol (COREG) 12.5 MG tablet, TAKE 1 TABLET BY MOUTH TWICE DAILY., Disp: 180 tablet, Rfl: 3 .  cilostazol (PLETAL) 100 MG tablet, TAKE 1 TABLET BY MOUTH TWICE DAILY., Disp: 180 tablet, Rfl: 3 .  ELIQUIS 2.5 MG TABS tablet, TAKE 1 TABLET BY MOUTH TWICE DAILY., Disp: 60 tablet, Rfl: 11 .  ezetimibe (ZETIA) 10 MG tablet, TAKE (1) TABLET DAILY., Disp: 90 tablet, Rfl: 0 .  FLUoxetine (PROZAC) 20 MG capsule, TAKE (1) CAPSULE DAILY., Disp: 90 capsule, Rfl: 0 .  furosemide (LASIX) 80 MG tablet, Take 1 and 1/2 tablets (120 mg) in the morning and 1 tablet (80 mg) in the afternoon, Disp: 75 tablet, Rfl: 5 .  hydrALAZINE (APRESOLINE) 50 MG tablet, TAKE 1 TABLET BY MOUTH 3 TIMES A DAY., Disp: 270 tablet, Rfl: 0 .  HYDROcodone-acetaminophen (NORCO/VICODIN) 5-325 MG tablet, hydrocodone 5 mg-acetaminophen 325 mg tablet, Disp: , Rfl:  .  isosorbide mononitrate (IMDUR) 30 MG 24 hr tablet, Take 1 tablet (30 mg total) by mouth daily., Disp: 30 tablet, Rfl: 6 .  levothyroxine (SYNTHROID, LEVOTHROID) 125 MCG tablet, TAKE 1 TABLET EACH DAY., Disp: 90 tablet, Rfl: 0 .  Multiple Vitamin (MULTIVITAMIN) tablet, Take 1 tablet by mouth daily., Disp: , Rfl:  .  potassium chloride SA (K-DUR,KLOR-CON) 20 MEQ tablet, Take 1 tablet (20 mEq total) by mouth daily., Disp: 90 tablet, Rfl: 3 .  temazepam (RESTORIL) 30 MG capsule, TAKE 1 CAPSULE AT BEDTIME AS NEEDED FOR SLEEP., Disp: 30 capsule, Rfl: 0 .  tiotropium (SPIRIVA HANDIHALER) 18 MCG inhalation capsule, Place 1 capsule (18 mcg total) into  inhaler and inhale daily., Disp: 30 capsule, Rfl: 12 .  glucose blood (ACCU-CHEK AVIVA PLUS) test strip, CHECK BLOOD SUGAR 4 TIMES A DAY., Disp: 100 each, Rfl: 11 .  Lancets MISC, Check blood sugar 4 times a day, Disp: 100 each, Rfl: 11 .  nitroGLYCERIN (NITROSTAT) 0.4 MG SL tablet, DISSOLVE 1 TABLET UNDER TONGUE AS NEEDED FOR CHEST PAIN,MAY REPEAT IN5 MINUTES  FOR 2 DOSES. (Patient not taking: Reported on 11/01/2018), Disp: 25 tablet, Rfl: 3 .  Potassium Chloride ER 20 MEQ TBCR, Take 20 mEq by mouth daily., Disp: 30 tablet, Rfl: 3 .  traMADol (ULTRAM) 50 MG tablet, Take 1 tablet (50 mg total) by mouth every 6 (six) hours as needed. (Patient not taking: Reported on 11/01/2018), Disp: 12 tablet, Rfl: 0 .  UNABLE TO FIND, nifedipine ER 30 mg tablet,extended release, Disp: , Rfl:  Allergies  Allergen Reactions  . Metoprolol Shortness Of Breath, Palpitations and Other (See Comments)    Heart starts racing. Shallow breathing; pt states he also get skin irritation on his legs  . Metformin Hives and Swelling    On legs  . Metformin And Related Nausea And Vomiting      Social History   Socioeconomic History  . Marital status: Married    Spouse name: Not on file  . Number of children: Not on file  . Years of education: Not on file  . Highest education level: Not on file  Occupational History  . 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 Financial resource strain: Not on file  . Food insecurity:    Worry: Not on file    Inability: Not on file  . Transportation needs:    Medical: Not on file    Non-medical: Not on file  Tobacco Use  . Smoking status: Former Smoker    Packs/day: 1.50    Years: 35.00    Pack years: 52.50    Types: Cigarettes  . Smokeless tobacco: Never Used  . Tobacco comment: quit atleast 25 yrs ago, per pt  Substance and Sexual Activity  . Alcohol use: Yes    Alcohol/week: 3.0 standard drinks    Types: 3 Shots of liquor per week    Comment: socially  . Drug use: No  . Sexual activity: Yes  Lifestyle  . Physical activity:    Days per week: Not on file    Minutes per session: Not on file  . Stress: Not on file  Relationships  . Social connections:    Talks on phone: Not on file    Gets together: Not on file    Attends religious service: Not on file    Active member of club or organization: Not on file    Attends  meetings of clubs or organizations: Not on file    Relationship status: Not on file  . Intimate partner violence:    Fear of current or ex partner: Not on file    Emotionally abused: Not on file    Physically abused: Not on file    Forced sexual activity: Not on file  Other Topics Concern  . Not on file  Social History Narrative   Pt lives in [place] with spouse.   Retired from [employer].  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    Physical Exam  Constitutional: He is oriented to person, place, and time.  Cardiovascular: Normal rate and regular rhythm.  Pulmonary/Chest: Effort normal and breath sounds normal.  Musculoskeletal: Normal  range of motion. He exhibits no edema.  Neurological: He is oriented to person, place, and time.  Skin: Skin is warm and dry.  Psychiatric: He has a normal mood and affect.        Future Appointments  Date Time Provider [place]  12/11/2018  4:00 PM Gardiner Barefoot, DPM [facility]  12/24/2018 11:40 AM Larey Dresser, MD [facility] None    BP 120/60 (BP Location: Left Arm, Patient Position: Sitting, Cuff Size: Normal)   Resp 16   Wt 194 lb (88 kg)   SpO2 91%   BMI 30.38 kg/m   Weight yesterday- Did not weigh Last visit weight- 191 lb  Mr Kina was seen at home today and reported feeling well. He denied chest pain SOB, headache, dizziness or orthopnea. He stated he had been compliant with his medications for the past week and said his weight has been stable His medications were verified and his pillbox was refilled.   [name], EMT 11/28/18  ACTION: Home visit completed Next visit planned for 1 week

## 2018-12-06 ENCOUNTER — Other Ambulatory Visit (HOSPITAL_COMMUNITY): Payer: Self-pay

## 2018-12-06 NOTE — Progress Notes (Signed)
Paramedicine Encounter    Patient ID: Andrew [name], male    DOB: 11-30-1928, 84 y.o.   MRN: [id number]   Patient Care Team: [name], MD as PCP - General (Family Medicine) Larey Dresser, MD as Consulting Physician (Cardiology) Jorge Ny, LCSW as Social Worker (Licensed Clinical Social Worker)  Patient Active Problem List   Diagnosis Date Noted  . Chronic respiratory failure with hypoxia ([place]) 09/16/2018  . Bilateral recurrent inguinal hernias 11/28/2017  . Calf pain 05/25/2016  . Carotid artery stenosis 01/12/2016  . Chronic systolic CHF (congestive heart failure) ([place]) 12/02/2015  . Cerebellar infarct ([place]) 10/16/2015  . 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 ([place]) 10/16/2015  . PVD (peripheral vascular disease) ([place])   . Hereditary and idiopathic peripheral neuropathy 10/02/2015  . Peripelvic (lymphatic) cyst   . Pernicious anemia 07/15/2015  . Bilateral renal cysts 07/12/2015  . Lung nodule, 60mm RML CT 07/12/15 07/12/2015  . Constipation 07/12/2015  . Kidney stone on right side 07/12/2015  . Stage 3 chronic kidney disease ([place]) 06/14/2015  . Chronic diastolic CHF (congestive heart failure) ([place]) 05/28/2015  . Paroxysmal atrial fibrillation ([place]) 05/28/2015  . AAA (abdominal aortic aneurysm) without rupture ([place]) 09/30/2014  . Left-sided low back pain without sciatica 07/21/2014  . Cardiomyopathy, ischemic 05/23/2014  . DM (diabetes mellitus), type 2 with renal complications ([place]) 40/98/1191  . CORONARY ATHEROSCLEROSIS NATIVE CORONARY ARTERY 11/09/2010  . Depression, major, single episode, complete remission ([place]) 03/18/2010  . Hypothyroidism 02/03/2009  . Osteoarthritis 05/27/2008  . Hyperlipidemia associated with type 2 diabetes mellitus ([place]) 06/14/2007  . Hypertension associated with diabetes ([place]) 06/14/2007  . GERD 06/14/2007    Current Outpatient Medications:  .  amiodarone (PACERONE) 200 MG tablet, Take 0.5 tablets (100 mg total) by  mouth daily., Disp: 45 tablet, Rfl: 3 .  atorvastatin (LIPITOR) 80 MG tablet, Take 1 tablet (80 mg total) by mouth daily., Disp: 30 tablet, Rfl: 6 .  carvedilol (COREG) 12.5 MG tablet, TAKE 1 TABLET BY MOUTH TWICE DAILY., Disp: 180 tablet, Rfl: 3 .  cilostazol (PLETAL) 100 MG tablet, TAKE 1 TABLET BY MOUTH TWICE DAILY., Disp: 180 tablet, Rfl: 3 .  ELIQUIS 2.5 MG TABS tablet, TAKE 1 TABLET BY MOUTH TWICE DAILY., Disp: 60 tablet, Rfl: 11 .  ezetimibe (ZETIA) 10 MG tablet, TAKE (1) TABLET DAILY., Disp: 90 tablet, Rfl: 0 .  FLUoxetine (PROZAC) 20 MG capsule, TAKE (1) CAPSULE DAILY., Disp: 90 capsule, Rfl: 0 .  furosemide (LASIX) 80 MG tablet, Take 1 and 1/2 tablets (120 mg) in the morning and 1 tablet (80 mg) in the afternoon, Disp: 75 tablet, Rfl: 5 .  hydrALAZINE (APRESOLINE) 50 MG tablet, TAKE 1 TABLET BY MOUTH 3 TIMES A DAY., Disp: 270 tablet, Rfl: 0 .  isosorbide mononitrate (IMDUR) 30 MG 24 hr tablet, Take 1 tablet (30 mg total) by mouth daily., Disp: 30 tablet, Rfl: 6 .  levothyroxine (SYNTHROID, LEVOTHROID) 125 MCG tablet, TAKE 1 TABLET EACH DAY., Disp: 90 tablet, Rfl: 0 .  Multiple Vitamin (MULTIVITAMIN) tablet, Take 1 tablet by mouth daily., Disp: , Rfl:  .  potassium chloride SA (K-DUR,KLOR-CON) 20 MEQ tablet, Take 1 tablet (20 mEq total) by mouth daily., Disp: 90 tablet, Rfl: 3 .  temazepam (RESTORIL) 30 MG capsule, TAKE 1 CAPSULE AT BEDTIME AS NEEDED FOR SLEEP., Disp: 30 capsule, Rfl: 0 .  tiotropium (SPIRIVA HANDIHALER) 18 MCG inhalation capsule, Place 1 capsule (18 mcg total) into inhaler and inhale daily., Disp: 30  capsule, Rfl: 12 .  glucose blood (ACCU-CHEK AVIVA PLUS) test strip, CHECK BLOOD SUGAR 4 TIMES A DAY., Disp: 100 each, Rfl: 11 .  HYDROcodone-acetaminophen (NORCO/VICODIN) 5-325 MG tablet, hydrocodone 5 mg-acetaminophen 325 mg tablet, Disp: , Rfl:  .  Lancets MISC, Check blood sugar 4 times a day, Disp: 100 each, Rfl: 11 .  nitroGLYCERIN (NITROSTAT) 0.4 MG SL tablet, DISSOLVE  1 TABLET UNDER TONGUE AS NEEDED FOR CHEST PAIN,MAY REPEAT IN5 MINUTES FOR 2 DOSES. (Patient not taking: Reported on 11/01/2018), Disp: 25 tablet, Rfl: 3 .  Potassium Chloride ER 20 MEQ TBCR, Take 20 mEq by mouth daily., Disp: 30 tablet, Rfl: 3 .  traMADol (ULTRAM) 50 MG tablet, Take 1 tablet (50 mg total) by mouth every 6 (six) hours as needed. (Patient not taking: Reported on 11/01/2018), Disp: 12 tablet, Rfl: 0 .  UNABLE TO FIND, nifedipine ER 30 mg tablet,extended release, Disp: , Rfl:  Allergies  Allergen Reactions  . Metoprolol Shortness Of Breath, Palpitations and Other (See Comments)    Heart starts racing. Shallow breathing; pt states he also get skin irritation on his legs  . Metformin Hives and Swelling    On legs  . Metformin And Related Nausea And Vomiting      Social History   Socioeconomic History  . Marital status: Married    Spouse name: Not on file  . Number of children: Not on file  . Years of education: Not on file  . Highest education level: Not on file  Occupational History  . 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 Financial resource strain: Not on file  . Food insecurity:    Worry: Not on file    Inability: Not on file  . Transportation needs:    Medical: Not on file    Non-medical: Not on file  Tobacco Use  . Smoking status: Former Smoker    Packs/day: 1.50    Years: 35.00    Pack years: 52.50    Types: Cigarettes  . Smokeless tobacco: Never Used  . Tobacco comment: quit atleast 25 yrs ago, per pt  Substance and Sexual Activity  . Alcohol use: Yes    Alcohol/week: 3.0 standard drinks    Types: 3 Shots of liquor per week    Comment: socially  . Drug use: No  . Sexual activity: Yes  Lifestyle  . Physical activity:    Days per week: Not on file    Minutes per session: Not on file  . Stress: Not on file  Relationships  . Social connections:    Talks on phone: Not on file    Gets together: Not on file    Attends religious service: Not on file     Active member of club or organization: Not on file    Attends meetings of clubs or organizations: Not on file    Relationship status: Not on file  . Intimate partner violence:    Fear of current or ex partner: Not on file    Emotionally abused: Not on file    Physically abused: Not on file    Forced sexual activity: Not on file  Other Topics Concern  . Not on file  Social History Narrative   Pt lives in [place] with spouse.   Retired from [employer].  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    Physical Exam Cardiovascular:     Rate and Rhythm: Normal rate and regular rhythm.  Pulmonary:  Effort: Pulmonary effort is normal.     Breath sounds: Normal breath sounds.  Musculoskeletal: Normal range of motion.     Right lower leg: No edema.     Left lower leg: No edema.  Skin:    General: Skin is warm and dry.     Capillary Refill: Capillary refill takes less than 2 seconds.  Neurological:     Mental Status: He is alert and oriented to person, place, and time.  Psychiatric:        Mood and Affect: Mood normal.         Future Appointments  Date Time Provider [place]  12/11/2018  4:00 PM Gardiner Barefoot, DPM [facility]  12/24/2018 11:40 AM Larey Dresser, MD [facility] None    BP 136/84 (BP Location: Right Arm, Patient Position: Sitting, Cuff Size: Large)   Pulse 70   Resp 15   Wt 194 lb (88 kg)   SpO2 95%   BMI 30.38 kg/m   Weight yesterday- Did not weigh Last visit weight- 194 lb  Mr [place] was seen at home today and reported feeling well. He denied chest pain. SOB, headache, dizziness or orthopnea. He had missed multiple doses of medications over the past week and stated he just has been forgetting. I stressed the importance of taking his medications as prescribed and he expressed understanding. His medications were verified and his pillbox was refilled.   [name],  EMT 12/07/18  ACTION: Home visit completed Next visit planned for 1 week

## 2018-12-11 ENCOUNTER — Ambulatory Visit: Payer: Medicare Other | Admitting: Podiatry

## 2018-12-12 ENCOUNTER — Ambulatory Visit: Payer: Medicare Other | Admitting: Podiatry

## 2018-12-12 ENCOUNTER — Encounter: Payer: Self-pay | Admitting: Podiatry

## 2018-12-12 DIAGNOSIS — I739 Peripheral vascular disease, unspecified: Secondary | ICD-10-CM

## 2018-12-12 DIAGNOSIS — M79676 Pain in unspecified toe(s): Secondary | ICD-10-CM

## 2018-12-12 DIAGNOSIS — E1159 Type 2 diabetes mellitus with other circulatory complications: Secondary | ICD-10-CM

## 2018-12-12 DIAGNOSIS — B351 Tinea unguium: Secondary | ICD-10-CM | POA: Diagnosis not present

## 2018-12-12 NOTE — Progress Notes (Signed)
Patient ID: Andrew [name], male   DOB: 09/30/1928, 84 y.o.   MRN: [id number] Complaint:  Visit Type: Patient returns to my office for continued preventative foot care services. Complaint: Patient states" my nails have grown long and thick and become painful to walk and wear shoes" Patient has been diagnosed with DM with no foot complications. The patient presents for preventative foot care services. No changes to ROS.  Patient is on eliquiss and pletal.  Podiatric Exam: Vascular: dorsalis pedis and posterior tibial pulses are palpable bilateral. Capillary return is immediate. Temperature gradient is WNL. Skin turgor WNL  Sensorium: Normal Semmes Weinstein monofilament test. Normal tactile sensation bilaterally. Nail Exam: Pt has thick disfigured discolored nails with subungual debris noted bilateral entire nail hallux through fifth toenails Ulcer Exam: There is no evidence of ulcer or pre-ulcerative changes or infection. Orthopedic Exam: Muscle tone and strength are WNL. No limitations in general ROM. No crepitus or effusions noted. Foot type and digits show no abnormalities. Bony prominences are unremarkable. 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 Skin: No Porokeratosis. No infection or ulcers.  Asymptomatic callus right foot  Diagnosis:  Onychomycosis, , Pain in right toe, pain in left toes  Treatment & Plan Procedures and Treatment: Consent by patient was obtained for treatment procedures. The patient understood the discussion of treatment and procedures well. All questions were answered thoroughly reviewed. Debridement of mycotic and hypertrophic toenails, 1 through 5 bilateral and clearing of subungual debris. No ulceration, no infection noted.  Return Visit-Office Procedure: Patient instructed to return to the office for a follow up visit 10 weeks. for continued evaluation and treatment.   Gardiner Barefoot DPM

## 2018-12-13 ENCOUNTER — Other Ambulatory Visit (HOSPITAL_COMMUNITY): Payer: Self-pay

## 2018-12-13 NOTE — Progress Notes (Signed)
Paramedicine Encounter    Patient ID: Andrew [name], male    DOB: [birth year], 84 y.o.   MRN: [id number]   Patient Care Team: [name], MD as PCP - General (Family Medicine) Larey Dresser, MD as Consulting Physician (Cardiology) Jorge Ny, LCSW as Social Worker (Licensed Clinical Social Worker)  Patient Active Problem List   Diagnosis Date Noted  . Chronic respiratory failure with hypoxia ([place]) 09/16/2018  . Bilateral recurrent inguinal hernias 11/28/2017  . Calf pain 05/25/2016  . Carotid artery stenosis 01/12/2016  . Chronic systolic CHF (congestive heart failure) ([place]) 12/02/2015  . Cerebellar infarct ([place]) 10/16/2015  . 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 ([place]) 10/16/2015  . PVD (peripheral vascular disease) ([place])   . Hereditary and idiopathic peripheral neuropathy 10/02/2015  . Peripelvic (lymphatic) cyst   . Pernicious anemia 07/15/2015  . Bilateral renal cysts 07/12/2015  . Lung nodule, 49mm RML CT 07/12/15 07/12/2015  . Constipation 07/12/2015  . Kidney stone on right side 07/12/2015  . Stage 3 chronic kidney disease ([place]) 06/14/2015  . Chronic diastolic CHF (congestive heart failure) ([place]) 05/28/2015  . Paroxysmal atrial fibrillation ([place]) 05/28/2015  . AAA (abdominal aortic aneurysm) without rupture ([place]) 09/30/2014  . Left-sided low back pain without sciatica 07/21/2014  . Cardiomyopathy, ischemic 05/23/2014  . DM (diabetes mellitus), type 2 with renal complications ([place]) 56/25/6389  . CORONARY ATHEROSCLEROSIS NATIVE CORONARY ARTERY 11/09/2010  . Depression, major, single episode, complete remission ([place]) 03/18/2010  . Hypothyroidism 02/03/2009  . Osteoarthritis 05/27/2008  . Hyperlipidemia associated with type 2 diabetes mellitus ([place]) 06/14/2007  . Hypertension associated with diabetes ([place]) 06/14/2007  . GERD 06/14/2007    Current Outpatient Medications:  .  amiodarone (PACERONE) 200 MG tablet, Take 0.5 tablets (100 mg total) by  mouth daily., Disp: 45 tablet, Rfl: 3 .  atorvastatin (LIPITOR) 80 MG tablet, Take 1 tablet (80 mg total) by mouth daily., Disp: 30 tablet, Rfl: 6 .  carvedilol (COREG) 12.5 MG tablet, TAKE 1 TABLET BY MOUTH TWICE DAILY., Disp: 180 tablet, Rfl: 3 .  cilostazol (PLETAL) 100 MG tablet, TAKE 1 TABLET BY MOUTH TWICE DAILY., Disp: 180 tablet, Rfl: 3 .  ELIQUIS 2.5 MG TABS tablet, TAKE 1 TABLET BY MOUTH TWICE DAILY., Disp: 60 tablet, Rfl: 11 .  ezetimibe (ZETIA) 10 MG tablet, TAKE (1) TABLET DAILY., Disp: 90 tablet, Rfl: 0 .  FLUoxetine (PROZAC) 20 MG capsule, TAKE (1) CAPSULE DAILY., Disp: 90 capsule, Rfl: 0 .  furosemide (LASIX) 80 MG tablet, Take 1 and 1/2 tablets (120 mg) in the morning and 1 tablet (80 mg) in the afternoon, Disp: 75 tablet, Rfl: 5 .  hydrALAZINE (APRESOLINE) 50 MG tablet, TAKE 1 TABLET BY MOUTH 3 TIMES A DAY., Disp: 270 tablet, Rfl: 0 .  HYDROcodone-acetaminophen (NORCO/VICODIN) 5-325 MG tablet, hydrocodone 5 mg-acetaminophen 325 mg tablet, Disp: , Rfl:  .  isosorbide mononitrate (IMDUR) 30 MG 24 hr tablet, Take 1 tablet (30 mg total) by mouth daily., Disp: 30 tablet, Rfl: 6 .  levothyroxine (SYNTHROID, LEVOTHROID) 125 MCG tablet, TAKE 1 TABLET EACH DAY., Disp: 90 tablet, Rfl: 0 .  Multiple Vitamin (MULTIVITAMIN) tablet, Take 1 tablet by mouth daily., Disp: , Rfl:  .  potassium chloride SA (K-DUR,KLOR-CON) 20 MEQ tablet, Take 1 tablet (20 mEq total) by mouth daily., Disp: 90 tablet, Rfl: 3 .  temazepam (RESTORIL) 30 MG capsule, TAKE 1 CAPSULE AT BEDTIME AS NEEDED FOR SLEEP., Disp: 30 capsule, Rfl: 0 .  tiotropium (SPIRIVA HANDIHALER)  18 MCG inhalation capsule, Place 1 capsule (18 mcg total) into inhaler and inhale daily., Disp: 30 capsule, Rfl: 12 .  glucose blood (ACCU-CHEK AVIVA PLUS) test strip, CHECK BLOOD SUGAR 4 TIMES A DAY., Disp: 100 each, Rfl: 11 .  Influenza Vac A&B Surf Ant Adj (FLUAD) 0.5 ML SUSY, Fluad 2019-20 57yr up(PF)45 mcg(15 mcgx3)/0.5 mL intramuscular syringe  ADM  0.5ML IM UTD, Disp: , Rfl:  .  Lancets MISC, Check blood sugar 4 times a day, Disp: 100 each, Rfl: 11 .  nitroGLYCERIN (NITROSTAT) 0.4 MG SL tablet, DISSOLVE 1 TABLET UNDER TONGUE AS NEEDED FOR CHEST PAIN,MAY REPEAT IN5 MINUTES FOR 2 DOSES., Disp: 25 tablet, Rfl: 3 .  OXYCODONE HCL PO, oxycodone 5 mg tablet, Disp: , Rfl:  .  Potassium Chloride ER 20 MEQ TBCR, Take 20 mEq by mouth daily., Disp: 30 tablet, Rfl: 3 .  predniSONE (DELTASONE) 10 MG tablet, prednisone 10 mg tablet, Disp: , Rfl:  .  traMADol (ULTRAM) 50 MG tablet, Take 1 tablet (50 mg total) by mouth every 6 (six) hours as needed., Disp: 12 tablet, Rfl: 0 .  UNABLE TO FIND, nifedipine ER 30 mg tablet,extended release, Disp: , Rfl:  Allergies  Allergen Reactions  . Metoprolol Shortness Of Breath, Palpitations and Other (See Comments)    Heart starts racing. Shallow breathing; pt states he also get skin irritation on his legs  . Metformin Hives and Swelling    On legs  . Metformin And Related Nausea And Vomiting      Social History   Socioeconomic History  . Marital status: Married    Spouse name: Not on file  . Number of children: Not on file  . Years of education: Not on file  . Highest education level: Not on file  Occupational History  . 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 Financial resource strain: Not on file  . Food insecurity:    Worry: Not on file    Inability: Not on file  . Transportation needs:    Medical: Not on file    Non-medical: Not on file  Tobacco Use  . Smoking status: Former Smoker    Packs/day: 1.50    Years: 35.00    Pack years: 52.50    Types: Cigarettes  . Smokeless tobacco: Never Used  . Tobacco comment: quit atleast 25 yrs ago, per pt  Substance and Sexual Activity  . Alcohol use: Yes    Alcohol/week: 3.0 standard drinks    Types: 3 Shots of liquor per week    Comment: socially  . Drug use: No  . Sexual activity: Yes  Lifestyle  . Physical activity:    Days per week: Not on file     Minutes per session: Not on file  . Stress: Not on file  Relationships  . Social connections:    Talks on phone: Not on file    Gets together: Not on file    Attends religious service: Not on file    Active member of club or organization: Not on file    Attends meetings of clubs or organizations: Not on file    Relationship status: Not on file  . Intimate partner violence:    Fear of current or ex partner: Not on file    Emotionally abused: Not on file    Physically abused: Not on file    Forced sexual activity: Not on file  Other Topics Concern  . Not on file  Social History Narrative  Pt lives in [place] with spouse.   Retired from [employer].  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    Physical Exam Cardiovascular:     Rate and Rhythm: Normal rate and regular rhythm.  Pulmonary:     Effort: Pulmonary effort is normal.     Breath sounds: Normal breath sounds.  Musculoskeletal:     Right lower leg: No edema.     Left lower leg: No edema.  Skin:    General: Skin is warm and dry.     Capillary Refill: Capillary refill takes less than 2 seconds.  Neurological:     Mental Status: He is alert and oriented to person, place, and time.  Psychiatric:        Mood and Affect: Mood normal.         Future Appointments  Date Time Provider [place]  12/24/2018 11:40 AM Larey Dresser, MD [facility] None  02/20/2019  3:45 PM Gardiner Barefoot, DPM [facility]    BP 118/60 (BP Location: Left Arm, Patient Position: Sitting, Cuff Size: Normal)   Pulse 76   Resp 16   Wt 193 lb (87.5 kg)   SpO2 92%   BMI 30.23 kg/m   Weight yesterday- 192 lb Last visit weight-   Mr Cohick was seen at home today and reported feeling generally well. He denied chest pain, SOB, headache, dizziness or orthopnea. He had been mostly compliant with his medications over the past week, but continues to miss a few doses per week. His  medications were verified and his pillbox was refilled.   [name], EMT 12/13/18  ACTION: Home visit completed Next visit planned for 1 week

## 2018-12-20 ENCOUNTER — Other Ambulatory Visit (HOSPITAL_COMMUNITY): Payer: Self-pay

## 2018-12-20 NOTE — Progress Notes (Signed)
Paramedicine Encounter    Patient ID: Andrew [name], male    DOB: Dec 04, 1928, 84 y.o.   MRN: [id number]   Patient Care Team: [name], MD as PCP - General (Family Medicine) Larey Dresser, MD as Consulting Physician (Cardiology) Jorge Ny, LCSW as Social Worker (Licensed Clinical Social Worker)  Patient Active Problem List   Diagnosis Date Noted  . Chronic respiratory failure with hypoxia ([place]) 09/16/2018  . Bilateral recurrent inguinal hernias 11/28/2017  . Calf pain 05/25/2016  . Carotid artery stenosis 01/12/2016  . Chronic systolic CHF (congestive heart failure) ([place]) 12/02/2015  . Cerebellar infarct ([place]) 10/16/2015  . 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 PVD (peripheral vascular disease) ([place])   . Hereditary and idiopathic peripheral neuropathy 10/02/2015  . Peripelvic (lymphatic) cyst   . Pernicious anemia 07/15/2015  . Bilateral renal cysts 07/12/2015  . Lung nodule, 12mm RML CT 07/12/15 07/12/2015  . Constipation 07/12/2015  . Kidney stone on right side 07/12/2015  . Stage 3 chronic kidney disease ([place]) 06/14/2015  . Chronic diastolic CHF (congestive heart failure) ([place]) 05/28/2015  . Paroxysmal atrial fibrillation ([place]) 05/28/2015  . AAA (abdominal aortic aneurysm) without rupture ([place]) 09/30/2014  . Left-sided low back pain without sciatica 07/21/2014  . Cardiomyopathy, ischemic 05/23/2014  . DM (diabetes mellitus), type 2 with renal complications ([place]) 31/49/7026  . CORONARY ATHEROSCLEROSIS NATIVE CORONARY ARTERY 11/09/2010  . Depression, major, single episode, complete remission ([place]) 03/18/2010  . Hypothyroidism 02/03/2009  . Osteoarthritis 05/27/2008  . Hyperlipidemia associated with type 2 diabetes mellitus ([place]) 06/14/2007  . Hypertension associated with diabetes ([place]) 06/14/2007  . GERD 06/14/2007    Current Outpatient Medications:  .  amiodarone (PACERONE) 200 MG tablet, Take 0.5 tablets (100 mg total) by  mouth daily., Disp: 45 tablet, Rfl: 3 .  atorvastatin (LIPITOR) 80 MG tablet, Take 1 tablet (80 mg total) by mouth daily., Disp: 30 tablet, Rfl: 6 .  carvedilol (COREG) 12.5 MG tablet, TAKE 1 TABLET BY MOUTH TWICE DAILY., Disp: 180 tablet, Rfl: 3 .  cilostazol (PLETAL) 100 MG tablet, TAKE 1 TABLET BY MOUTH TWICE DAILY., Disp: 180 tablet, Rfl: 3 .  ELIQUIS 2.5 MG TABS tablet, TAKE 1 TABLET BY MOUTH TWICE DAILY., Disp: 60 tablet, Rfl: 11 .  ezetimibe (ZETIA) 10 MG tablet, TAKE (1) TABLET DAILY., Disp: 90 tablet, Rfl: 0 .  FLUoxetine (PROZAC) 20 MG capsule, TAKE (1) CAPSULE DAILY., Disp: 90 capsule, Rfl: 0 .  furosemide (LASIX) 80 MG tablet, Take 1 and 1/2 tablets (120 mg) in the morning and 1 tablet (80 mg) in the afternoon, Disp: 75 tablet, Rfl: 5 .  hydrALAZINE (APRESOLINE) 50 MG tablet, TAKE 1 TABLET BY MOUTH 3 TIMES A DAY., Disp: 270 tablet, Rfl: 0 .  HYDROcodone-acetaminophen (NORCO/VICODIN) 5-325 MG tablet, hydrocodone 5 mg-acetaminophen 325 mg tablet, Disp: , Rfl:  .  isosorbide mononitrate (IMDUR) 30 MG 24 hr tablet, Take 1 tablet (30 mg total) by mouth daily., Disp: 30 tablet, Rfl: 6 .  levothyroxine (SYNTHROID, LEVOTHROID) 125 MCG tablet, TAKE 1 TABLET EACH DAY., Disp: 90 tablet, Rfl: 0 .  Multiple Vitamin (MULTIVITAMIN) tablet, Take 1 tablet by mouth daily., Disp: , Rfl:  .  potassium chloride SA (K-DUR,KLOR-CON) 20 MEQ tablet, Take 1 tablet (20 mEq total) by mouth daily., Disp: 90 tablet, Rfl: 3 .  temazepam (RESTORIL) 30 MG capsule, TAKE 1 CAPSULE AT BEDTIME AS NEEDED FOR SLEEP., Disp: 30 capsule, Rfl: 0 .  tiotropium (SPIRIVA HANDIHALER)  18 MCG inhalation capsule, Place 1 capsule (18 mcg total) into inhaler and inhale daily., Disp: 30 capsule, Rfl: 12 .  glucose blood (ACCU-CHEK AVIVA PLUS) test strip, CHECK BLOOD SUGAR 4 TIMES A DAY., Disp: 100 each, Rfl: 11 .  Influenza Vac A&B Surf Ant Adj (FLUAD) 0.5 ML SUSY, Fluad 2019-20 39yr up(PF)45 mcg(15 mcgx3)/0.5 mL intramuscular syringe  ADM  0.5ML IM UTD, Disp: , Rfl:  .  Lancets MISC, Check blood sugar 4 times a day, Disp: 100 each, Rfl: 11 .  nitroGLYCERIN (NITROSTAT) 0.4 MG SL tablet, DISSOLVE 1 TABLET UNDER TONGUE AS NEEDED FOR CHEST PAIN,MAY REPEAT IN5 MINUTES FOR 2 DOSES. (Patient not taking: Reported on 12/20/2018), Disp: 25 tablet, Rfl: 3 .  OXYCODONE HCL PO, oxycodone 5 mg tablet, Disp: , Rfl:  .  Potassium Chloride ER 20 MEQ TBCR, Take 20 mEq by mouth daily., Disp: 30 tablet, Rfl: 3 .  predniSONE (DELTASONE) 10 MG tablet, prednisone 10 mg tablet, Disp: , Rfl:  .  traMADol (ULTRAM) 50 MG tablet, Take 1 tablet (50 mg total) by mouth every 6 (six) hours as needed. (Patient not taking: Reported on 12/20/2018), Disp: 12 tablet, Rfl: 0 .  UNABLE TO FIND, nifedipine ER 30 mg tablet,extended release, Disp: , Rfl:  Allergies  Allergen Reactions  . Metoprolol Shortness Of Breath, Palpitations and Other (See Comments)    Heart starts racing. Shallow breathing; pt states he also get skin irritation on his legs  . Metformin Hives and Swelling    On legs  . Metformin And Related Nausea And Vomiting      Social History   Socioeconomic History  . Marital status: Married    Spouse name: Not on file  . Number of children: Not on file  . Years of education: Not on file  . Highest education level: Not on file  Occupational History  . 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 Financial resource strain: Not on file  . Food insecurity:    Worry: Not on file    Inability: Not on file  . Transportation needs:    Medical: Not on file    Non-medical: Not on file  Tobacco Use  . Smoking status: Former Smoker    Packs/day: 1.50    Years: 35.00    Pack years: 52.50    Types: Cigarettes  . Smokeless tobacco: Never Used  . Tobacco comment: quit atleast 25 yrs ago, per pt  Substance and Sexual Activity  . Alcohol use: Yes    Alcohol/week: 3.0 standard drinks    Types: 3 Shots of liquor per week    Comment: socially  . Drug use: No   . Sexual activity: Yes  Lifestyle  . Physical activity:    Days per week: Not on file    Minutes per session: Not on file  . Stress: Not on file  Relationships  . Social connections:    Talks on phone: Not on file    Gets together: Not on file    Attends religious service: Not on file    Active member of club or organization: Not on file    Attends meetings of clubs or organizations: Not on file    Relationship status: Not on file  . Intimate partner violence:    Fear of current or ex partner: Not on file    Emotionally abused: Not on file    Physically abused: Not on file    Forced sexual activity: Not on file  Other Topics  Concern  . Not on file  Social History Narrative   Pt lives in [place] with spouse.   Retired from [employer].  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    Physical Exam Cardiovascular:     Rate and Rhythm: Normal rate and regular rhythm.  Pulmonary:     Effort: Pulmonary effort is normal.     Breath sounds: Normal breath sounds.  Abdominal:     General: There is no distension.  Musculoskeletal: Normal range of motion.     Right lower leg: No edema.     Left lower leg: No edema.  Neurological:     Mental Status: He is alert.  Psychiatric:        Mood and Affect: Mood normal.         Future Appointments  Date Time Provider [place]  12/24/2018 11:40 AM Larey Dresser, MD [facility] None  02/20/2019  3:45 PM Gardiner Barefoot, DPM [facility]    BP (!) 108/58 (BP Location: Left Arm, Patient Position: Sitting, Cuff Size: Large)   Pulse 77   Resp 16   Wt 193 lb (87.5 kg)   SpO2 92%   BMI 30.23 kg/m   Weight yesterday- did not weigh Last visit weight- 193 lb  Mr Zuercher was seen at home today and reported feeling generally well. He denied chest pain, SOB, headache, dizziness or orthopnea. He reported being "mostly" compliant with his medications over the past week, though  he continues to miss multiple doses per week. His medications were verified and his pillbox was refilled.   [name], EMT 12/20/18  ACTION: Home visit completed Next visit planned for 1 week

## 2018-12-24 ENCOUNTER — Ambulatory Visit (HOSPITAL_COMMUNITY)
Admission: RE | Admit: 2018-12-24 | Discharge: 2018-12-24 | Disposition: A | Discharge status: Home / Self Care | Payer: Medicare Other | Source: Ambulatory Visit | Attending: Cardiology | Admitting: Cardiology

## 2018-12-24 ENCOUNTER — Encounter (HOSPITAL_COMMUNITY): Payer: Self-pay | Admitting: Cardiology

## 2018-12-24 VITALS — BP 140/70 | HR 84 | Wt 191.4 lb

## 2018-12-24 DIAGNOSIS — Z79899 Other long term (current) drug therapy: Secondary | ICD-10-CM | POA: Insufficient documentation

## 2018-12-24 DIAGNOSIS — J449 Chronic obstructive pulmonary disease, unspecified: Secondary | ICD-10-CM | POA: Insufficient documentation

## 2018-12-24 DIAGNOSIS — I5022 Chronic systolic (congestive) heart failure: Secondary | ICD-10-CM | POA: Diagnosis not present

## 2018-12-24 DIAGNOSIS — Z8673 Personal history of transient ischemic attack (TIA), and cerebral infarction without residual deficits: Secondary | ICD-10-CM | POA: Insufficient documentation

## 2018-12-24 DIAGNOSIS — Z87891 Personal history of nicotine dependence: Secondary | ICD-10-CM | POA: Diagnosis not present

## 2018-12-24 DIAGNOSIS — R2689 Other abnormalities of gait and mobility: Secondary | ICD-10-CM | POA: Diagnosis not present

## 2018-12-24 DIAGNOSIS — I2510 Atherosclerotic heart disease of native coronary artery without angina pectoris: Secondary | ICD-10-CM | POA: Insufficient documentation

## 2018-12-24 DIAGNOSIS — Z79890 Hormone replacement therapy (postmenopausal): Secondary | ICD-10-CM | POA: Insufficient documentation

## 2018-12-24 DIAGNOSIS — I130 Hypertensive heart and chronic kidney disease with heart failure and stage 1 through stage 4 chronic kidney disease, or unspecified chronic kidney disease: Secondary | ICD-10-CM | POA: Diagnosis present

## 2018-12-24 DIAGNOSIS — E1122 Type 2 diabetes mellitus with diabetic chronic kidney disease: Secondary | ICD-10-CM | POA: Diagnosis not present

## 2018-12-24 DIAGNOSIS — I480 Paroxysmal atrial fibrillation: Secondary | ICD-10-CM | POA: Insufficient documentation

## 2018-12-24 DIAGNOSIS — E785 Hyperlipidemia, unspecified: Secondary | ICD-10-CM | POA: Diagnosis not present

## 2018-12-24 DIAGNOSIS — I4892 Unspecified atrial flutter: Secondary | ICD-10-CM | POA: Insufficient documentation

## 2018-12-24 DIAGNOSIS — Z951 Presence of aortocoronary bypass graft: Secondary | ICD-10-CM | POA: Insufficient documentation

## 2018-12-24 DIAGNOSIS — E039 Hypothyroidism, unspecified: Secondary | ICD-10-CM | POA: Diagnosis not present

## 2018-12-24 DIAGNOSIS — I5032 Chronic diastolic (congestive) heart failure: Secondary | ICD-10-CM | POA: Diagnosis not present

## 2018-12-24 DIAGNOSIS — Z85820 Personal history of malignant melanoma of skin: Secondary | ICD-10-CM | POA: Diagnosis not present

## 2018-12-24 DIAGNOSIS — N183 Chronic kidney disease, stage 3 unspecified: Secondary | ICD-10-CM

## 2018-12-24 DIAGNOSIS — Z7901 Long term (current) use of anticoagulants: Secondary | ICD-10-CM | POA: Diagnosis not present

## 2018-12-24 DIAGNOSIS — K589 Irritable bowel syndrome without diarrhea: Secondary | ICD-10-CM | POA: Insufficient documentation

## 2018-12-24 DIAGNOSIS — I255 Ischemic cardiomyopathy: Secondary | ICD-10-CM | POA: Insufficient documentation

## 2018-12-24 DIAGNOSIS — K219 Gastro-esophageal reflux disease without esophagitis: Secondary | ICD-10-CM | POA: Insufficient documentation

## 2018-12-24 DIAGNOSIS — I779 Disorder of arteries and arterioles, unspecified: Secondary | ICD-10-CM | POA: Diagnosis not present

## 2018-12-24 DIAGNOSIS — R911 Solitary pulmonary nodule: Secondary | ICD-10-CM | POA: Diagnosis not present

## 2018-12-24 DIAGNOSIS — I252 Old myocardial infarction: Secondary | ICD-10-CM | POA: Diagnosis not present

## 2018-12-24 LAB — COMPREHENSIVE METABOLIC PANEL
ALT: 31 U/L (ref 0–44)
AST: 40 U/L (ref 15–41)
Albumin: 3.6 g/dL (ref 3.5–5.0)
Alkaline Phosphatase: 51 U/L (ref 38–126)
Anion gap: 12 (ref 5–15)
BUN: 28 mg/dL — ABNORMAL HIGH (ref 8–23)
CO2: 26 mmol/L (ref 22–32)
CREATININE: 1.69 mg/dL — AB (ref 0.61–1.24)
Calcium: 9.2 mg/dL (ref 8.9–10.3)
Chloride: 101 mmol/L (ref 98–111)
GFR calc Af Amer: 41 mL/min — ABNORMAL LOW (ref >60)
GFR, EST NON AFRICAN AMERICAN: 35 mL/min — AB (ref >60)
Glucose, Bld: 198 mg/dL — ABNORMAL HIGH (ref 70–99)
Potassium: 3.5 mmol/L (ref 3.5–5.1)
Sodium: 139 mmol/L (ref 135–145)
Total Bilirubin: 0.8 mg/dL (ref 0.3–1.2)
Total Protein: 6.6 g/dL (ref 6.5–8.1)

## 2018-12-24 LAB — LIPID PANEL
Cholesterol: 133 mg/dL (ref 0–200)
HDL: 39 mg/dL — ABNORMAL LOW (ref >40)
LDL Cholesterol: 75 mg/dL (ref 0–99)
Total CHOL/HDL Ratio: 3.4 RATIO
Triglycerides: 96 mg/dL (ref <150)
VLDL: 19 mg/dL (ref 0–40)

## 2018-12-24 LAB — TSH: TSH: 3.664 u[IU]/mL (ref 0.350–4.500)

## 2018-12-24 MED ORDER — LEVOTHYROXINE SODIUM 125 MCG PO TABS: 125.0000 ug | ORAL_TABLET | Freq: Every day | ORAL | 1 refills | Status: DC

## 2018-12-24 MED ORDER — HYDRALAZINE HCL 50 MG PO TABS: 50.0000 mg | ORAL_TABLET | Freq: Three times a day (TID) | ORAL | 1 refills | Status: DC

## 2018-12-24 NOTE — Progress Notes (Signed)
Patient ID: Andrew [name], male   DOB: [birth year], 84 y.o.   MRN: [id number] PCP: Dr. Jerline Pain Cardiology: Dr. Christella Noa is a 84 y.o. male with history of CKD, CAD s/p CABG, and ischemic cardiomyopathy with primarily diastolic CHF presents for followup of CHF and CAD.  He has a history of PAD followed at [facility].  I took him for [facility] in 11/15. 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  He had had occlusion of a relatively small LAD, there were right to left collaterals and no intervention was done (medical management).   He and his wife went on a cruise along the [employer] in 5/16.  He admits to considerable dietary indiscretion (high sodium diet).  It appears that he developed a hypertensive crisis along with chest pain while on the ship. He went into atrial fibrillation and developed CHF.  He was taken to the hospital in [place], [place].  He was in the ICU for about a week on Bipap intermittently.  Troponin peaked at 0.09 during this admission.  He was diuresed and after about 2 wks left the hospital and was able to fly home.    Cardiolite (6/16) showed EF 42%, prior anterolateral MI, no ischemia. Echo in 10/16 with EF 40-45%.    He was admitted on 07/06/15 for RLQ pain and fever.  He was treated for UTI and had a kidney stone as well.  He developed SOB after IVF were given for AKI.  He was treated for acute COPD exacerbation as well as CHF decompensation.  He was diuresed with IV lasix.  His weight on admission was 179 and got as high as 188 after IVF. Discharge weight was 181.  He was admitted in 10/16 with a cerebellar CVA.  He has some resultant imbalance.   He was again admitted in 11/16 with acute on chronic systolic CHF.  This was a short admission that appeared to be precipitated by a sodium load from eating at a [employer] .   3/18 Echo showed stable EF 40-45%.  He was also having chest pain so I did a Cardiolite in 3/18, no ischemia.   He was admitted in 11/18  for elective hernia repair. Post-op, he developed hypoxemia and had a rehab stay.  He is now off oxygen.   Creatinine increased to 2.97 in 1/19.  I cut back on losartan and Lasix.  He developed increased lower extremity edema and we increased Lasix back to 120 qam/80 qpm.  He has been doing well symptomatically.  Weight is down 4 lbs.  No BRBPR/melena.  No dyspnea walking on flat ground though he is not very active in general.  Minimal claudication.  No chest pain.  No lightheadedness.  SBP is mildly elevated today.  He is not sure if he is taking hydralazine or Levoxyl.   ECG (personally reviewed): NSR, 1st degree AVB, LBBB 148 msec  Labs (12/14): K 4.8, creatinine 1.5 Labs (5/15): LDL 92 Labs (11/15): K 4.1, creatinine 1.5 Labs (12/15): K 4, creatinine 1.5 Labs (1/16): LFTs normal, LDL 51, HDL 47 Labs (5/16): TnI 0.09 Labs (6/16): K 4.8 => 4.4, creatinine 1.79 => 2.26, HCT 28.7 Labs (7/16) K 4.2, creatinine 1.98, LFTs normal, TSH normal Labs (8/16): HCT 27.1 Labs (9/16): hgb 8.1, K 4.3, creatinine 1.8, LFTs normal, TSH elevated Labs (10/16): LDL 74, HDL 45, TSH 5.19 (increased), free T3 and free T4 normal.  Labs (11/16): K 3.5, creatinine 1.47, AST  84, ALT 89 Labs (12/16): K 4.3, creatinine 1.58, AST 43, ALT 42 Labs (3/17): K 4.2, creatinine 1.4, hgb 10.7, AST 36, ALT 45 Labs (6/17): K 4, creatinine 1.69, hgb 10.9, proBNP 389, TSH mildly elevated Labs (8/17): Free T4 and T3 normal, TSH mildly elevated, K 4.2, creatinine 1.7, LFTs normal, hgb 10.4 Labs (12/17): TSH elevated but free T3 and T4 normal, K 4.5, creatinine 1.9, LDL 45, HDL 39, LFTs normal, HCT 32.9 Labs (1/18): K 4.4, creatinine 1.93 Labs (3/18): K 4.3, creatinine 1.87, BNP 187, hgb 11.1, LDL 75, LFTs normal Labs (4/18): K 4.1, creatinine 1.7 Labs (9/18): K 4, creatinine 1.8, hgb 11.9 Labs (10/18): free T4 0.4, free T3 1.9, TSH 139  Labs (12/18): K 3.4, creatinine 1.76, hgb 12.2 Labs (1/19): K 4 => 4.9, creatinine 2.7 =>  2.97, TSH 179, LFTs normal Labs (2/19): K 4.6, creatinine 2.19 Labs (4/19): LDL 61, HDL 36, K 3.9, creatinine 1.77, LFTs normal  Labs (6/19): hgb 11.7, K 3.8, creatinine 2.09, TSH 10 Labs (9/19): K 3.9, creatinine 1.69  PMH: 1. CKD stage 3 2. HTN 3. Hyperlipidemia 4. AAA: s/p surgery with left renal artery bypass in 1997 - Abdominal US 10/18 with 3.2 AAA 5. GERD 6. IBS 7. Melanoma s/p reception 8. Diabetes: Diet-controlled 9. Carotid stenosis: Carotid dopplers (4/15) with 40-59% bilateral stenosis. Carotid dopplers (7/16) with < 40% BICA stenosis.  Carotid dopplers (10/16) with 40-59% BICA stenosis.  - Carotids (8/17) with minimal disease.  - Carotids (10/18) with 1-39% BICA stenosis.  - Carotids (1/19): 1-39% BICA stenosis 10. PAD: 9/14 ABIs 0.91 right 1.09 left.  7/16 ABIs 0.86 right, 1.1 left; aortoiliac duplex with < 50% bilateral iliac stenosis.  - Aortoiliac duplex (8/17) with < 50% bilateral iliac stenosis.   - ABIs (10/17): left 0.91, right 0.98, left TBI 0.44 (abnormal).  - ABIs (7/18): right 0.97, right 0.75 - ABIs (1/19): right 1.04, left 1.03 - ABIs (7/19): right 1.13, noncompressible left ABI but TBI 0.93 11. CAD: Has super-dominant RCA. s/p CABG 1992.  He had Taxus DES to mid PDA in 5/02. LHC (1/05) with 90% ostial PDA (had DES to this vessel), totally occluded RIMA-PDA, totally occluded PLV, sequential SVG-PLV with 1 branch occluded.  LHC (11/15) with left main off the right cusp, 40-50% ostial left main, total occlusion of the proximal LAD (small vessel) with right to left collaterals, large super-dominant RCA with 50% ISR mid RCA, 50% ostial PDA, patent SVG-PLV, RIMA-PDA known atretic. Lexiscan Cardiolite (6/16) with EF 42%, prior anterolateral MI, no ischemia.  - Lexiscan Cardiolite (3/18): EF 51%, basal to mid anterolateral fixed defect with no ischemia.  12. Ischemic cardiomyopathy: Echo (12/09) with EF 45-50%, basal to mid inferolateral hypokinesis. Echo (6/15) with  EF 45-50%, akinesis of the mid to apical inferolateral wall.  Echo (6/16) with EF 50-55%, mild LVH, inferior hypokinesis, mild MR.  Echo (10/16) with EF 40-45%, moderate LVH, mildly decreased RV systolic function.  - Echo (3/18) with EF 40-45%, mid anterior hypokinesis.  - Echo (2/19) with EF 60-65%, mild LVH, trade II diastolic dysfunction.  13. OA 14. Atrial fibrillation/flutter: Paroxysmal.  Noted during 5/16 hospitalization in [place] and in 6/16.  He developed atrial flutter in 8/16, back in NSR by 9/16.  15. Emphysema: PFTs (8/16) with FVC 88%, FEV1 74%, ratio 81%, TLC 83%, DLCO 31%. - PFTs (12/18): FVC 64%, FEV1 69%, ratio 105%, TLC 82%, DLCO 33% => mild obstruction, severely decreased DLCO (similar to prior).   16. Renal cysts  17. Idiopathic peripheral neuropathy.  18. Anemia.  57. 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  20. Hernia repair (11/18) 21. Hypothyroidism 22. Bronchiectasis: CT chest 1/19 with bronchiectasis.  23. Lung nodule: 1.3 cm RML nodule on 1/19 CT, was noted in 2014 => slow growing. Possibly malignant.  Decision made with pulmonary to watch and avoid biopsy.  24. Suspect chronic aspiration with bronchiectasis.   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  FH: Brother with CAD.   Review of systems complete and found to be negative unless listed in HPI.    Current Outpatient Medications  Medication Sig Dispense Refill  . amiodarone (PACERONE) 200 MG tablet Take 0.5 tablets (100 mg total) by mouth daily. 45 tablet 3  . atorvastatin (LIPITOR) 80 MG tablet Take 1 tablet (80 mg total) by mouth daily. 30 tablet 6  . carvedilol (COREG) 12.5 MG tablet TAKE 1 TABLET BY MOUTH TWICE DAILY. 180 tablet 3  . cilostazol (PLETAL) 100 MG tablet TAKE 1 TABLET BY MOUTH TWICE DAILY. 180 tablet 3  . ELIQUIS 2.5 MG TABS tablet TAKE 1 TABLET BY MOUTH TWICE DAILY. 60 tablet 11  . ezetimibe (ZETIA) 10 MG tablet TAKE (1) TABLET DAILY. 90 tablet 0   . FLUoxetine (PROZAC) 20 MG capsule TAKE (1) CAPSULE DAILY. 90 capsule 0  . furosemide (LASIX) 80 MG tablet Take 1 and 1/2 tablets (120 mg) in the morning and 1 tablet (80 mg) in the afternoon 75 tablet 5  . isosorbide mononitrate (IMDUR) 30 MG 24 hr tablet Take 1 tablet (30 mg total) by mouth daily. 30 tablet 6  . potassium chloride SA (K-DUR,KLOR-CON) 20 MEQ tablet Take 1 tablet (20 mEq total) by mouth daily. 90 tablet 3  . predniSONE (DELTASONE) 10 MG tablet prednisone 10 mg tablet    . temazepam (RESTORIL) 30 MG capsule TAKE 1 CAPSULE AT BEDTIME AS NEEDED FOR SLEEP. 30 capsule 0  . tiotropium (SPIRIVA HANDIHALER) 18 MCG inhalation capsule Place 1 capsule (18 mcg total) into inhaler and inhale daily. 30 capsule 12  . UNABLE TO FIND nifedipine ER 30 mg tablet,extended release    . glucose blood (ACCU-CHEK AVIVA PLUS) test strip CHECK BLOOD SUGAR 4 TIMES A DAY. (Patient not taking: Reported on 12/24/2018) 100 each 11  . hydrALAZINE (APRESOLINE) 50 MG tablet Take 1 tablet (50 mg total) by mouth 3 (three) times daily. 270 tablet 1  . HYDROcodone-acetaminophen (NORCO/VICODIN) 5-325 MG tablet hydrocodone 5 mg-acetaminophen 325 mg tablet    . Influenza Vac A&B Surf Ant Adj (FLUAD) 0.5 ML SUSY Fluad 2019-20 50yr up(PF)45 mcg(15 mcgx3)/0.5 mL intramuscular syringe  ADM 0.5ML IM UTD    . Lancets MISC Check blood sugar 4 times a day (Patient not taking: Reported on 12/24/2018) 100 each 11  . levothyroxine (SYNTHROID, LEVOTHROID) 125 MCG tablet Take 1 tablet (125 mcg total) by mouth daily before breakfast. 90 tablet 1  . Multiple Vitamin (MULTIVITAMIN) tablet Take 1 tablet by mouth daily.    . nitroGLYCERIN (NITROSTAT) 0.4 MG SL tablet DISSOLVE 1 TABLET UNDER TONGUE AS NEEDED FOR CHEST PAIN,MAY REPEAT IN5 MINUTES FOR 2 DOSES. (Patient not taking: Reported on 12/20/2018) 25 tablet 3  . OXYCODONE HCL PO oxycodone 5 mg tablet    . Potassium Chloride ER 20 MEQ TBCR Take 20 mEq by mouth daily. 30 tablet 3  .  traMADol (ULTRAM) 50 MG tablet Take 1 tablet (50 mg total) by mouth every 6 (six) hours as needed. (Patient not taking:  Reported on 12/20/2018) 12 tablet 0   No current facility-administered medications for this encounter.    Vitals:   12/24/18 1155  BP: 140/70  Pulse: 84  SpO2: 90%  Weight: 86.8 kg (191 lb 6.4 oz)   Wt Readings from Last 3 Encounters:  12/24/18 86.8 kg (191 lb 6.4 oz)  12/20/18 87.5 kg (193 lb)  12/13/18 87.5 kg (193 lb)    Physical Exam General: NAD Neck: No JVD, no thyromegaly or thyroid nodule.  Lungs: Clear to auscultation bilaterally with normal respiratory effort. CV: Nondisplaced PMI.  Heart regular S1/S2, no S3/S4, no murmur.  No peripheral edema.  No carotid bruit.  Unable to palpate pedal pulses.  Abdomen: Soft, nontender, no hepatosplenomegaly, no distention.  Skin: Intact without lesions or rashes.  Neurologic: Alert and oriented x 3.  Psych: Normal affect. Extremities: No clubbing or cyanosis.  HEENT: Normal.   Assessment/Plan:  1. CAD: s/p CABG.  Cath in 11/15 showed occluded LAD with left to right collaterals.  The LAD was a relatively small vessel (super-dominant right).  Managed medically. Lexiscan Cardiolite in 6/16 with infarction but no ischemia.  Cardiolite 3/18 with infarction, no ischemia. No chest pain.  - Continue ASA and statin.  2. Ischemic cardiomyopathy with systolic CHF: EF 01-09% on 3/18 echo.  Echo 2/19 with LVEF improved to 60-65%. NYHA II. He is not volume overloaded on exam. Weight is down.  He is off losartan with elevated creatinine.  - Continue Lasix 120 qam/80 qpm.  Will get BMET.  - Continue hydralazine 50 mg tid for BP control, he is not sure that he is taking this and will check when he gets home.  I will send in a prescription in case he is not taking it.   - Continue Imdur 30 mg daily.    - Continue Coreg 12.5 mg bid.    3. Carotid stenosis: Stable. Followed at [facility].  4. PAD: Improved claudication, ABIs have normalized  when last checked.  No pedal ulcers.   - Follows at [facility]. Plan for medical management.  - Continue cilostazol, unable to come off due to worsening claudication when he stops it.  5. Hyperlipidemia: Continue atorvastatin, check lipids today.    6. CKD III:  BMET today. Off losartan.  7. Atrial fibrillation/flutter:  Paroxysmal.  NSR today.  - Continue amiodarone 100 mg daily. Will need to get regular eye exams with amiodarone use.  High resolution CT in 1/19 did not appear to show evidence for amiodarone lung toxicity. Check LFTs today, TSH followed by PCP (hypothyroidism).  - Continue Eliquis 2.5 bid.   8. Suspected OSA:  Says that he would not use CPAP.  9. COPD: He is now off oxygen. He has bronchiectasis thought to be due to chronic aspiration. - Follows with Dr. Lake Bells.  10. CVA: Cerebellar, 10/16.  He has had some resulting imbalance.  - He is no longer driving.  11. Hypertension: He will make sure that he is taking hydralazine.    12. Hypothyroidism: Likely related to amiodarone. -  Improving under PCP's management.  Needs to check to make sure he is on Levoxyl, I will send in prescription in case he is not.  13. Pulmonary nodule: 1.3 cm RML nodule on 1/19 CT chest.  It was also seen in 2014 => slow growing.  Dr Lake Bells has discussed with wife and they have decided not to go forward with biopsy. They would not be interested in lung cancer treatment.   Followup in  3 months.   Loralie Champagne, MD  12/24/2018

## 2018-12-24 NOTE — Patient Instructions (Signed)
Today you have been seen at the Heart failure clinic at [facility] had an EKG done today  You had Lab work done today:  We will call you if your lab work is abnormal.. No news is good news!!   Follow up with Dr. [name] in 3 months  Do the following things EVERYDAY: 1) Weigh yourself in the morning before breakfast. Write it down and keep it in a log. 2) Take your medicines as prescribed 3) Eat low salt foods-Limit salt (sodium) to 2000 mg per day.  4) Stay as active as you can everyday 5) Limit all fluids for the day to less than 2 liters

## 2018-12-27 ENCOUNTER — Other Ambulatory Visit (HOSPITAL_COMMUNITY): Payer: Self-pay

## 2018-12-27 NOTE — Progress Notes (Signed)
Paramedicine Encounter    Patient ID: Andrew [name], male    DOB: 1928/03/16, 85 y.o.   MRN: [id number]   Patient Care Team: [name], MD as PCP - General (Family Medicine) Larey Dresser, MD as Consulting Physician (Cardiology) Jorge Ny, LCSW as Social Worker (Licensed Clinical Social Worker)  Patient Active Problem List   Diagnosis Date Noted  . Chronic respiratory failure with hypoxia ([place]) 09/16/2018  . Bilateral recurrent inguinal hernias 11/28/2017  . Calf pain 05/25/2016  . Carotid artery stenosis 01/12/2016  . Chronic systolic CHF (congestive heart failure) ([place]) 12/02/2015  . Cerebellar infarct ([place]) 10/16/2015  . 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 ([place]) 10/16/2015  . PVD (peripheral vascular disease) ([place])   . Hereditary and idiopathic peripheral neuropathy 10/02/2015  . Peripelvic (lymphatic) cyst   . Pernicious anemia 07/15/2015  . Bilateral renal cysts 07/12/2015  . Lung nodule, 64mm RML CT 07/12/15 07/12/2015  . Constipation 07/12/2015  . Kidney stone on right side 07/12/2015  . Stage 3 chronic kidney disease ([place]) 06/14/2015  . Chronic diastolic CHF (congestive heart failure) ([place]) 05/28/2015  . Paroxysmal atrial fibrillation ([place]) 05/28/2015  . AAA (abdominal aortic aneurysm) without rupture ([place]) 09/30/2014  . Left-sided low back pain without sciatica 07/21/2014  . Cardiomyopathy, ischemic 05/23/2014  . DM (diabetes mellitus), type 2 with renal complications ([place]) 95/28/4132  . CORONARY ATHEROSCLEROSIS NATIVE CORONARY ARTERY 11/09/2010  . Depression, major, single episode, complete remission ([place]) 03/18/2010  . Hypothyroidism 02/03/2009  . Osteoarthritis 05/27/2008  . Hyperlipidemia associated with type 2 diabetes mellitus ([place]) 06/14/2007  . Hypertension associated with diabetes ([place]) 06/14/2007  . GERD 06/14/2007    Current Outpatient Medications:  .  amiodarone (PACERONE) 200 MG tablet, Take 0.5 tablets (100 mg total) by  mouth daily., Disp: 45 tablet, Rfl: 3 .  atorvastatin (LIPITOR) 80 MG tablet, Take 1 tablet (80 mg total) by mouth daily., Disp: 30 tablet, Rfl: 6 .  carvedilol (COREG) 12.5 MG tablet, TAKE 1 TABLET BY MOUTH TWICE DAILY., Disp: 180 tablet, Rfl: 3 .  cilostazol (PLETAL) 100 MG tablet, TAKE 1 TABLET BY MOUTH TWICE DAILY., Disp: 180 tablet, Rfl: 3 .  ELIQUIS 2.5 MG TABS tablet, TAKE 1 TABLET BY MOUTH TWICE DAILY., Disp: 60 tablet, Rfl: 11 .  ezetimibe (ZETIA) 10 MG tablet, TAKE (1) TABLET DAILY., Disp: 90 tablet, Rfl: 0 .  FLUoxetine (PROZAC) 20 MG capsule, TAKE (1) CAPSULE DAILY., Disp: 90 capsule, Rfl: 0 .  furosemide (LASIX) 80 MG tablet, Take 1 and 1/2 tablets (120 mg) in the morning and 1 tablet (80 mg) in the afternoon, Disp: 75 tablet, Rfl: 5 .  hydrALAZINE (APRESOLINE) 50 MG tablet, Take 1 tablet (50 mg total) by mouth 3 (three) times daily., Disp: 270 tablet, Rfl: 1 .  HYDROcodone-acetaminophen (NORCO/VICODIN) 5-325 MG tablet, hydrocodone 5 mg-acetaminophen 325 mg tablet, Disp: , Rfl:  .  isosorbide mononitrate (IMDUR) 30 MG 24 hr tablet, Take 1 tablet (30 mg total) by mouth daily., Disp: 30 tablet, Rfl: 6 .  levothyroxine (SYNTHROID, LEVOTHROID) 125 MCG tablet, Take 1 tablet (125 mcg total) by mouth daily before breakfast., Disp: 90 tablet, Rfl: 1 .  Multiple Vitamin (MULTIVITAMIN) tablet, Take 1 tablet by mouth daily., Disp: , Rfl:  .  potassium chloride SA (K-DUR,KLOR-CON) 20 MEQ tablet, Take 1 tablet (20 mEq total) by mouth daily., Disp: 90 tablet, Rfl: 3 .  temazepam (RESTORIL) 30 MG capsule, TAKE 1 CAPSULE AT BEDTIME AS NEEDED FOR SLEEP., Disp:  30 capsule, Rfl: 0 .  tiotropium (SPIRIVA HANDIHALER) 18 MCG inhalation capsule, Place 1 capsule (18 mcg total) into inhaler and inhale daily., Disp: 30 capsule, Rfl: 12 .  glucose blood (ACCU-CHEK AVIVA PLUS) test strip, CHECK BLOOD SUGAR 4 TIMES A DAY. (Patient not taking: Reported on 12/24/2018), Disp: 100 each, Rfl: 11 .  Influenza Vac A&B  Surf Ant Adj (FLUAD) 0.5 ML SUSY, Fluad 2019-20 23yr up(PF)45 mcg(15 mcgx3)/0.5 mL intramuscular syringe  ADM 0.5ML IM UTD, Disp: , Rfl:  .  Lancets MISC, Check blood sugar 4 times a day (Patient not taking: Reported on 12/24/2018), Disp: 100 each, Rfl: 11 .  nitroGLYCERIN (NITROSTAT) 0.4 MG SL tablet, DISSOLVE 1 TABLET UNDER TONGUE AS NEEDED FOR CHEST PAIN,MAY REPEAT IN5 MINUTES FOR 2 DOSES. (Patient not taking: Reported on 12/20/2018), Disp: 25 tablet, Rfl: 3 .  OXYCODONE HCL PO, oxycodone 5 mg tablet, Disp: , Rfl:  .  Potassium Chloride ER 20 MEQ TBCR, Take 20 mEq by mouth daily., Disp: 30 tablet, Rfl: 3 .  predniSONE (DELTASONE) 10 MG tablet, prednisone 10 mg tablet, Disp: , Rfl:  .  traMADol (ULTRAM) 50 MG tablet, Take 1 tablet (50 mg total) by mouth every 6 (six) hours as needed. (Patient not taking: Reported on 12/20/2018), Disp: 12 tablet, Rfl: 0 .  UNABLE TO FIND, nifedipine ER 30 mg tablet,extended release, Disp: , Rfl:  Allergies  Allergen Reactions  . Metoprolol Shortness Of Breath, Palpitations and Other (See Comments)    Heart starts racing. Shallow breathing; pt states he also get skin irritation on his legs  . Metformin Hives and Swelling    On legs  . Metformin And Related Nausea And Vomiting      Social History   Socioeconomic History  . Marital status: Married    Spouse name: Not on file  . Number of children: Not on file  . Years of education: Not on file  . Highest education level: Not on file  Occupational History  . 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 Financial resource strain: Not on file  . Food insecurity:    Worry: Not on file    Inability: Not on file  . Transportation needs:    Medical: Not on file    Non-medical: Not on file  Tobacco Use  . Smoking status: Former Smoker    Packs/day: 1.50    Years: 35.00    Pack years: 52.50    Types: Cigarettes  . Smokeless tobacco: Never Used  . Tobacco comment: quit atleast 25 yrs ago, per pt  Substance  and Sexual Activity  . Alcohol use: Yes    Alcohol/week: 3.0 standard drinks    Types: 3 Shots of liquor per week    Comment: socially  . Drug use: No  . Sexual activity: Yes  Lifestyle  . Physical activity:    Days per week: Not on file    Minutes per session: Not on file  . Stress: Not on file  Relationships  . Social connections:    Talks on phone: Not on file    Gets together: Not on file    Attends religious service: Not on file    Active member of club or organization: Not on file    Attends meetings of clubs or organizations: Not on file    Relationship status: Not on file  . Intimate partner violence:    Fear of current or ex partner: Not on file    Emotionally abused: Not on  file    Physically abused: Not on file    Forced sexual activity: Not on file  Other Topics Concern  . Not on file  Social History Narrative   Pt lives in [place] with spouse.   Retired from [employer].  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    Physical Exam Cardiovascular:     Rate and Rhythm: Normal rate and regular rhythm.  Pulmonary:     Effort: Pulmonary effort is normal.     Breath sounds: Normal breath sounds.  Musculoskeletal: Normal range of motion.     Right lower leg: No edema.     Left lower leg: No edema.  Skin:    General: Skin is warm and dry.     Capillary Refill: Capillary refill takes less than 2 seconds.  Neurological:     Mental Status: He is alert and oriented to person, place, and time.  Psychiatric:        Mood and Affect: Mood normal.         Future Appointments  Date Time Provider [place]  02/20/2019  3:45 PM Gardiner Barefoot, DPM [facility]  03/22/2019  1:40 PM Larey Dresser, MD [facility] None    BP 124/60 (BP Location: Left Arm, Patient Position: Sitting, Cuff Size: Normal)   Pulse 88   Resp 18   Wt 193 lb (87.5 kg)   SpO2 91%   BMI 30.23 kg/m   Weight yesterday- Did not  weigh Last visit weight- 191 lb   Mr Fowles was seen at home today and reported feeling well. He denied chest pain, SOB, headache, dizziness or orthopnea. He reported being compliant with his medications for the most part but forgot to take any on Sunday. His weight has also been stable over the past week. His medications were verified and his pillbox was refilled.   [name], EMT 12/27/18  ACTION: Home visit completed Next visit planned for 1 week

## 2019-01-03 ENCOUNTER — Other Ambulatory Visit (HOSPITAL_COMMUNITY): Payer: Self-pay

## 2019-01-03 NOTE — Progress Notes (Signed)
Paramedicine Encounter    Patient ID: Andrew [name], male    DOB: [birth year], 85 y.o.   MRN: [id number]   Patient Care Team: [name], MD as PCP - General (Family Medicine) Larey Dresser, MD as Consulting Physician (Cardiology) Jorge Ny, LCSW as Social Worker (Licensed Clinical Social Worker)  Patient Active Problem List   Diagnosis Date Noted  . Chronic respiratory failure with hypoxia ([place]) 09/16/2018  . Bilateral recurrent inguinal hernias 11/28/2017  . Calf pain 05/25/2016  . Carotid artery stenosis 01/12/2016  . Chronic systolic CHF (congestive heart failure) ([place]) 12/02/2015  . Cerebellar infarct ([place]) 10/16/2015  . 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 ([place]) 10/16/2015  . PVD (peripheral vascular disease) ([place])   . Hereditary and idiopathic peripheral neuropathy 10/02/2015  . Peripelvic (lymphatic) cyst   . Pernicious anemia 07/15/2015  . Bilateral renal cysts 07/12/2015  . Lung nodule, 7mm RML CT 07/12/15 07/12/2015  . Constipation 07/12/2015  . Kidney stone on right side 07/12/2015  . Stage 3 chronic kidney disease ([place]) 06/14/2015  . Chronic diastolic CHF (congestive heart failure) ([place]) 05/28/2015  . Paroxysmal atrial fibrillation ([place]) 05/28/2015  . AAA (abdominal aortic aneurysm) without rupture ([place]) 09/30/2014  . Left-sided low back pain without sciatica 07/21/2014  . Cardiomyopathy, ischemic 05/23/2014  . DM (diabetes mellitus), type 2 with renal complications ([place]) 74/11/8785  . CORONARY ATHEROSCLEROSIS NATIVE CORONARY ARTERY 11/09/2010  . Depression, major, single episode, complete remission ([place]) 03/18/2010  . Hypothyroidism 02/03/2009  . Osteoarthritis 05/27/2008  . Hyperlipidemia associated with type 2 diabetes mellitus ([place]) 06/14/2007  . Hypertension associated with diabetes ([place]) 06/14/2007  . GERD 06/14/2007    Current Outpatient Medications:  .  amiodarone (PACERONE) 200 MG tablet, Take 0.5 tablets (100 mg total) by  mouth daily., Disp: 45 tablet, Rfl: 3 .  atorvastatin (LIPITOR) 80 MG tablet, Take 1 tablet (80 mg total) by mouth daily., Disp: 30 tablet, Rfl: 6 .  carvedilol (COREG) 12.5 MG tablet, TAKE 1 TABLET BY MOUTH TWICE DAILY., Disp: 180 tablet, Rfl: 3 .  cilostazol (PLETAL) 100 MG tablet, TAKE 1 TABLET BY MOUTH TWICE DAILY., Disp: 180 tablet, Rfl: 3 .  ELIQUIS 2.5 MG TABS tablet, TAKE 1 TABLET BY MOUTH TWICE DAILY., Disp: 60 tablet, Rfl: 11 .  ezetimibe (ZETIA) 10 MG tablet, TAKE (1) TABLET DAILY., Disp: 90 tablet, Rfl: 0 .  FLUoxetine (PROZAC) 20 MG capsule, TAKE (1) CAPSULE DAILY., Disp: 90 capsule, Rfl: 0 .  furosemide (LASIX) 80 MG tablet, Take 1 and 1/2 tablets (120 mg) in the morning and 1 tablet (80 mg) in the afternoon, Disp: 75 tablet, Rfl: 5 .  hydrALAZINE (APRESOLINE) 50 MG tablet, Take 1 tablet (50 mg total) by mouth 3 (three) times daily., Disp: 270 tablet, Rfl: 1 .  HYDROcodone-acetaminophen (NORCO/VICODIN) 5-325 MG tablet, hydrocodone 5 mg-acetaminophen 325 mg tablet, Disp: , Rfl:  .  isosorbide mononitrate (IMDUR) 30 MG 24 hr tablet, Take 1 tablet (30 mg total) by mouth daily., Disp: 30 tablet, Rfl: 6 .  levothyroxine (SYNTHROID, LEVOTHROID) 125 MCG tablet, Take 1 tablet (125 mcg total) by mouth daily before breakfast., Disp: 90 tablet, Rfl: 1 .  Multiple Vitamin (MULTIVITAMIN) tablet, Take 1 tablet by mouth daily., Disp: , Rfl:  .  potassium chloride SA (K-DUR,KLOR-CON) 20 MEQ tablet, Take 1 tablet (20 mEq total) by mouth daily., Disp: 90 tablet, Rfl: 3 .  temazepam (RESTORIL) 30 MG capsule, TAKE 1 CAPSULE AT BEDTIME AS NEEDED FOR SLEEP., Disp:  30 capsule, Rfl: 0 .  tiotropium (SPIRIVA HANDIHALER) 18 MCG inhalation capsule, Place 1 capsule (18 mcg total) into inhaler and inhale daily., Disp: 30 capsule, Rfl: 12 .  glucose blood (ACCU-CHEK AVIVA PLUS) test strip, CHECK BLOOD SUGAR 4 TIMES A DAY. (Patient not taking: Reported on 12/24/2018), Disp: 100 each, Rfl: 11 .  Influenza Vac A&B  Surf Ant Adj (FLUAD) 0.5 ML SUSY, Fluad 2019-20 66yr up(PF)45 mcg(15 mcgx3)/0.5 mL intramuscular syringe  ADM 0.5ML IM UTD, Disp: , Rfl:  .  Lancets MISC, Check blood sugar 4 times a day (Patient not taking: Reported on 12/24/2018), Disp: 100 each, Rfl: 11 .  nitroGLYCERIN (NITROSTAT) 0.4 MG SL tablet, DISSOLVE 1 TABLET UNDER TONGUE AS NEEDED FOR CHEST PAIN,MAY REPEAT IN5 MINUTES FOR 2 DOSES. (Patient not taking: Reported on 12/20/2018), Disp: 25 tablet, Rfl: 3 .  OXYCODONE HCL PO, oxycodone 5 mg tablet, Disp: , Rfl:  .  Potassium Chloride ER 20 MEQ TBCR, Take 20 mEq by mouth daily., Disp: 30 tablet, Rfl: 3 .  predniSONE (DELTASONE) 10 MG tablet, prednisone 10 mg tablet, Disp: , Rfl:  .  traMADol (ULTRAM) 50 MG tablet, Take 1 tablet (50 mg total) by mouth every 6 (six) hours as needed. (Patient not taking: Reported on 12/20/2018), Disp: 12 tablet, Rfl: 0 .  UNABLE TO FIND, nifedipine ER 30 mg tablet,extended release, Disp: , Rfl:  Allergies  Allergen Reactions  . Metoprolol Shortness Of Breath, Palpitations and Other (See Comments)    Heart starts racing. Shallow breathing; pt states Andrew [name] also get skin irritation on his legs  . Metformin Hives and Swelling    On legs  . Metformin And Related Nausea And Vomiting      Social History   Socioeconomic History  . Marital status: Married    Spouse name: Not on file  . Number of children: Not on file  . Years of education: Not on file  . Highest education level: Not on file  Occupational History  . 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 Financial resource strain: Not on file  . Food insecurity:    Worry: Not on file    Inability: Not on file  . Transportation needs:    Medical: Not on file    Non-medical: Not on file  Tobacco Use  . Smoking status: Former Smoker    Packs/day: 1.50    Years: 35.00    Pack years: 52.50    Types: Cigarettes  . Smokeless tobacco: Never Used  . Tobacco comment: quit atleast 25 yrs ago, per pt  Substance  and Sexual Activity  . Alcohol use: Yes    Alcohol/week: 3.0 standard drinks    Types: 3 Shots of liquor per week    Comment: socially  . Drug use: No  . Sexual activity: Yes  Lifestyle  . Physical activity:    Days per week: Not on file    Minutes per session: Not on file  . Stress: Not on file  Relationships  . Social connections:    Talks on phone: Not on file    Gets together: Not on file    Attends religious service: Not on file    Active member of club or organization: Not on file    Attends meetings of clubs or organizations: Not on file    Relationship status: Not on file  . Intimate partner violence:    Fear of current or ex partner: Not on file    Emotionally abused: Not on  file    Physically abused: Not on file    Forced sexual activity: Not on file  Other Topics Concern  . Not on file  Social History Narrative   Pt lives in [place] with spouse.   Retired from [employer].  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    Physical Exam Cardiovascular:     Rate and Rhythm: Normal rate and regular rhythm.  Pulmonary:     Breath sounds: Normal breath sounds.  Musculoskeletal:     Right lower leg: No edema.     Left lower leg: No edema.  Skin:    General: Skin is warm and dry.     Capillary Refill: Capillary refill takes less than 2 seconds.  Neurological:     Mental Status: Andrew [name] is alert.  Psychiatric:        Mood and Affect: Mood normal.         Future Appointments  Date Time Provider [place]  02/20/2019  3:45 PM Gardiner Barefoot, DPM [facility]  03/22/2019  1:40 PM Larey Dresser, MD [facility] None    BP 140/80 (BP Location: Right Arm, Patient Position: Sitting, Cuff Size: Large)   Pulse 74   Resp 16   Wt 189 lb (85.7 kg)   SpO2 93%   BMI 29.60 kg/m   Weight yesterday- Did not weigh Last visit weight- 193 lb  Andrew [name] was seen at home today and reported feeling well. Andrew [name] denied  chest apin. SOB, headache, dizziness or orthopnea. Andrew [name] stated Andrew [name] has been compliant with his medications, though Andrew [name] admits to missing a few times. His weight has been stable and actually dropped a bit over the week. His medications were verified and his pillbox was refilled.   [name], EMT 01/03/19  ACTION: Home visit completed Next visit planned for 1 week

## 2019-01-10 ENCOUNTER — Other Ambulatory Visit (HOSPITAL_COMMUNITY): Payer: Self-pay

## 2019-01-10 NOTE — Progress Notes (Signed)
Paramedicine Encounter    Patient ID: Andrew [name], male    DOB: 11-Oct-1928, 85 y.o.   MRN: [id number]   Patient Care Team: [name], MD as PCP - General (Family Medicine) Larey Dresser, MD as Consulting Physician (Cardiology) Jorge Ny, LCSW as Social Worker (Licensed Clinical Social Worker)  Patient Active Problem List   Diagnosis Date Noted  . Chronic respiratory failure with hypoxia ([place]) 09/16/2018  . Bilateral recurrent inguinal hernias 11/28/2017  . Calf pain 05/25/2016  . Carotid artery stenosis 01/12/2016  . Chronic systolic CHF (congestive heart failure) ([place]) 12/02/2015  . Cerebellar infarct ([place]) 10/16/2015  . 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 ([place]) 10/16/2015  . PVD (peripheral vascular disease) ([place])   . Hereditary and idiopathic peripheral neuropathy 10/02/2015  . Peripelvic (lymphatic) cyst   . Pernicious anemia 07/15/2015  . Bilateral renal cysts 07/12/2015  . Lung nodule, 48mm RML CT 07/12/15 07/12/2015  . Constipation 07/12/2015  . Kidney stone on right side 07/12/2015  . Stage 3 chronic kidney disease ([place]) 06/14/2015  . Chronic diastolic CHF (congestive heart failure) ([place]) 05/28/2015  . Paroxysmal atrial fibrillation ([place]) 05/28/2015  . AAA (abdominal aortic aneurysm) without rupture ([place]) 09/30/2014  . Left-sided low back pain without sciatica 07/21/2014  . Cardiomyopathy, ischemic 05/23/2014  . DM (diabetes mellitus), type 2 with renal complications ([place]) 85/27/7824  . CORONARY ATHEROSCLEROSIS NATIVE CORONARY ARTERY 11/09/2010  . Depression, major, single episode, complete remission ([place]) 03/18/2010  . Hypothyroidism 02/03/2009  . Osteoarthritis 05/27/2008  . Hyperlipidemia associated with type 2 diabetes mellitus ([place]) 06/14/2007  . Hypertension associated with diabetes ([place]) 06/14/2007  . GERD 06/14/2007    Current Outpatient Medications:  .  amiodarone (PACERONE) 200 MG tablet, Take 0.5 tablets (100 mg total) by  mouth daily., Disp: 45 tablet, Rfl: 3 .  atorvastatin (LIPITOR) 80 MG tablet, Take 1 tablet (80 mg total) by mouth daily., Disp: 30 tablet, Rfl: 6 .  carvedilol (COREG) 12.5 MG tablet, TAKE 1 TABLET BY MOUTH TWICE DAILY., Disp: 180 tablet, Rfl: 3 .  cilostazol (PLETAL) 100 MG tablet, TAKE 1 TABLET BY MOUTH TWICE DAILY., Disp: 180 tablet, Rfl: 3 .  ELIQUIS 2.5 MG TABS tablet, TAKE 1 TABLET BY MOUTH TWICE DAILY., Disp: 60 tablet, Rfl: 11 .  ezetimibe (ZETIA) 10 MG tablet, TAKE (1) TABLET DAILY., Disp: 90 tablet, Rfl: 0 .  FLUoxetine (PROZAC) 20 MG capsule, TAKE (1) CAPSULE DAILY., Disp: 90 capsule, Rfl: 0 .  furosemide (LASIX) 80 MG tablet, Take 1 and 1/2 tablets (120 mg) in the morning and 1 tablet (80 mg) in the afternoon, Disp: 75 tablet, Rfl: 5 .  hydrALAZINE (APRESOLINE) 50 MG tablet, Take 1 tablet (50 mg total) by mouth 3 (three) times daily., Disp: 270 tablet, Rfl: 1 .  HYDROcodone-acetaminophen (NORCO/VICODIN) 5-325 MG tablet, hydrocodone 5 mg-acetaminophen 325 mg tablet, Disp: , Rfl:  .  isosorbide mononitrate (IMDUR) 30 MG 24 hr tablet, Take 1 tablet (30 mg total) by mouth daily., Disp: 30 tablet, Rfl: 6 .  levothyroxine (SYNTHROID, LEVOTHROID) 125 MCG tablet, Take 1 tablet (125 mcg total) by mouth daily before breakfast., Disp: 90 tablet, Rfl: 1 .  Multiple Vitamin (MULTIVITAMIN) tablet, Take 1 tablet by mouth daily., Disp: , Rfl:  .  potassium chloride SA (K-DUR,KLOR-CON) 20 MEQ tablet, Take 1 tablet (20 mEq total) by mouth daily., Disp: 90 tablet, Rfl: 3 .  temazepam (RESTORIL) 30 MG capsule, TAKE 1 CAPSULE AT BEDTIME AS NEEDED FOR SLEEP., Disp:  30 capsule, Rfl: 0 .  tiotropium (SPIRIVA HANDIHALER) 18 MCG inhalation capsule, Place 1 capsule (18 mcg total) into inhaler and inhale daily., Disp: 30 capsule, Rfl: 12 .  glucose blood (ACCU-CHEK AVIVA PLUS) test strip, CHECK BLOOD SUGAR 4 TIMES A DAY. (Patient not taking: Reported on 12/24/2018), Disp: 100 each, Rfl: 11 .  Influenza Vac A&B  Surf Ant Adj (FLUAD) 0.5 ML SUSY, Fluad 2019-20 9yr up(PF)45 mcg(15 mcgx3)/0.5 mL intramuscular syringe  ADM 0.5ML IM UTD, Disp: , Rfl:  .  Lancets MISC, Check blood sugar 4 times a day (Patient not taking: Reported on 12/24/2018), Disp: 100 each, Rfl: 11 .  nitroGLYCERIN (NITROSTAT) 0.4 MG SL tablet, DISSOLVE 1 TABLET UNDER TONGUE AS NEEDED FOR CHEST PAIN,MAY REPEAT IN5 MINUTES FOR 2 DOSES. (Patient not taking: Reported on 12/20/2018), Disp: 25 tablet, Rfl: 3 .  OXYCODONE HCL PO, oxycodone 5 mg tablet, Disp: , Rfl:  .  Potassium Chloride ER 20 MEQ TBCR, Take 20 mEq by mouth daily., Disp: 30 tablet, Rfl: 3 .  predniSONE (DELTASONE) 10 MG tablet, prednisone 10 mg tablet, Disp: , Rfl:  .  traMADol (ULTRAM) 50 MG tablet, Take 1 tablet (50 mg total) by mouth every 6 (six) hours as needed. (Patient not taking: Reported on 12/20/2018), Disp: 12 tablet, Rfl: 0 .  UNABLE TO FIND, nifedipine ER 30 mg tablet,extended release, Disp: , Rfl:  Allergies  Allergen Reactions  . Metoprolol Shortness Of Breath, Palpitations and Other (See Comments)    Heart starts racing. Shallow breathing; pt states he also get skin irritation on his legs  . Metformin Hives and Swelling    On legs  . Metformin And Related Nausea And Vomiting      Social History   Socioeconomic History  . Marital status: Married    Spouse name: Not on file  . Number of children: Not on file  . Years of education: Not on file  . Highest education level: Not on file  Occupational History  . 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 Financial resource strain: Not on file  . Food insecurity:    Worry: Not on file    Inability: Not on file  . Transportation needs:    Medical: Not on file    Non-medical: Not on file  Tobacco Use  . Smoking status: Former Smoker    Packs/day: 1.50    Years: 35.00    Pack years: 52.50    Types: Cigarettes  . Smokeless tobacco: Never Used  . Tobacco comment: quit atleast 25 yrs ago, per pt  Substance  and Sexual Activity  . Alcohol use: Yes    Alcohol/week: 3.0 standard drinks    Types: 3 Shots of liquor per week    Comment: socially  . Drug use: No  . Sexual activity: Yes  Lifestyle  . Physical activity:    Days per week: Not on file    Minutes per session: Not on file  . Stress: Not on file  Relationships  . Social connections:    Talks on phone: Not on file    Gets together: Not on file    Attends religious service: Not on file    Active member of club or organization: Not on file    Attends meetings of clubs or organizations: Not on file    Relationship status: Not on file  . Intimate partner violence:    Fear of current or ex partner: Not on file    Emotionally abused: Not on  file    Physically abused: Not on file    Forced sexual activity: Not on file  Other Topics Concern  . Not on file  Social History Narrative   Pt lives in [place] with spouse.   Retired from [employer].  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    Physical Exam Cardiovascular:     Rate and Rhythm: Normal rate and regular rhythm.     Pulses: Normal pulses.  Pulmonary:     Effort: Pulmonary effort is normal.     Breath sounds: Normal breath sounds.  Musculoskeletal: Normal range of motion.     Right lower leg: No edema.     Left lower leg: No edema.  Skin:    General: Skin is dry.  Neurological:     Mental Status: He is alert and oriented to person, place, and time.  Psychiatric:        Mood and Affect: Mood normal.         Future Appointments  Date Time Provider [place]  02/20/2019  3:45 PM Gardiner Barefoot, DPM [facility]  03/22/2019  1:40 PM Larey Dresser, MD [facility] None    BP 124/66 (BP Location: Left Arm, Patient Position: Standing, Cuff Size: Normal)   Pulse 70   Resp 16   Wt 193 lb (87.5 kg)   SpO2 91%   BMI 30.23 kg/m   Weight yesterday- 191 lb Last visit weight- 189 lb  Mr Flott was seen  at home today and reported feeling well. He denied chest pain, SOB, headache, dizziness or orthopnea. He stated he has been compliant with his medications and his weight has been stable. His medications were verified and his pillbox was refilled.   [name], EMT 01/10/19  ACTION: Home visit completed Next visit planned for 1 week

## 2019-01-17 ENCOUNTER — Other Ambulatory Visit (HOSPITAL_COMMUNITY): Payer: Self-pay

## 2019-01-17 NOTE — Progress Notes (Signed)
Paramedicine Encounter    Patient ID: Andrew [name], male    DOB: 12/05/1928, 85 y.o.   MRN: [id number]   Patient Care Team: [name], MD as PCP - General (Family Medicine) Larey Dresser, MD as Consulting Physician (Cardiology) Jorge Ny, LCSW as Social Worker (Licensed Clinical Social Worker)  Patient Active Problem List   Diagnosis Date Noted  . Chronic respiratory failure with hypoxia ([place]) 09/16/2018  . Bilateral recurrent inguinal hernias 11/28/2017  . Calf pain 05/25/2016  . Carotid artery stenosis 01/12/2016  . Chronic systolic CHF (congestive heart failure) ([place]) 12/02/2015  . Cerebellar infarct ([place]) 10/16/2015  . 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 ([place]) 10/16/2015  . PVD (peripheral vascular disease) ([name])   . Hereditary and idiopathic peripheral neuropathy 10/02/2015  . Peripelvic (lymphatic) cyst   . Pernicious anemia 07/15/2015  . Bilateral renal cysts 07/12/2015  . Lung nodule, 27mm RML CT 07/12/15 07/12/2015  . Constipation 07/12/2015  . Kidney stone on right side 07/12/2015  . Stage 3 chronic kidney disease ([place]) 06/14/2015  . Chronic diastolic CHF (congestive heart failure) ([place]) 05/28/2015  . Paroxysmal atrial fibrillation ([place]) 05/28/2015  . AAA (abdominal aortic aneurysm) without rupture ([place]) 09/30/2014  . Left-sided low back pain without sciatica 07/21/2014  . Cardiomyopathy, ischemic 05/23/2014  . DM (diabetes mellitus), type 2 with renal complications ([place]) 92/10/9416  . CORONARY ATHEROSCLEROSIS NATIVE CORONARY ARTERY 11/09/2010  . Depression, major, single episode, complete remission ([place]) 03/18/2010  . Hypothyroidism 02/03/2009  . Osteoarthritis 05/27/2008  . Hyperlipidemia associated with type 2 diabetes mellitus ([place]) 06/14/2007  . Hypertension associated with diabetes ([place]) 06/14/2007  . GERD 06/14/2007    Current Outpatient Medications:  .  amiodarone (PACERONE) 200 MG tablet, Take 0.5 tablets (100 mg total) by  mouth daily., Disp: 45 tablet, Rfl: 3 .  atorvastatin (LIPITOR) 80 MG tablet, Take 1 tablet (80 mg total) by mouth daily., Disp: 30 tablet, Rfl: 6 .  carvedilol (COREG) 12.5 MG tablet, TAKE 1 TABLET BY MOUTH TWICE DAILY., Disp: 180 tablet, Rfl: 3 .  cilostazol (PLETAL) 100 MG tablet, TAKE 1 TABLET BY MOUTH TWICE DAILY., Disp: 180 tablet, Rfl: 3 .  ELIQUIS 2.5 MG TABS tablet, TAKE 1 TABLET BY MOUTH TWICE DAILY., Disp: 60 tablet, Rfl: 11 .  ezetimibe (ZETIA) 10 MG tablet, TAKE (1) TABLET DAILY., Disp: 90 tablet, Rfl: 0 .  FLUoxetine (PROZAC) 20 MG capsule, TAKE (1) CAPSULE DAILY., Disp: 90 capsule, Rfl: 0 .  hydrALAZINE (APRESOLINE) 50 MG tablet, Take 1 tablet (50 mg total) by mouth 3 (three) times daily., Disp: 270 tablet, Rfl: 1 .  isosorbide mononitrate (IMDUR) 30 MG 24 hr tablet, Take 1 tablet (30 mg total) by mouth daily., Disp: 30 tablet, Rfl: 6 .  potassium chloride SA (K-DUR,KLOR-CON) 20 MEQ tablet, Take 1 tablet (20 mEq total) by mouth daily., Disp: 90 tablet, Rfl: 3 .  temazepam (RESTORIL) 30 MG capsule, TAKE 1 CAPSULE AT BEDTIME AS NEEDED FOR SLEEP., Disp: 30 capsule, Rfl: 0 .  tiotropium (SPIRIVA HANDIHALER) 18 MCG inhalation capsule, Place 1 capsule (18 mcg total) into inhaler and inhale daily., Disp: 30 capsule, Rfl: 12 .  furosemide (LASIX) 80 MG tablet, Take 1 and 1/2 tablets (120 mg) in the morning and 1 tablet (80 mg) in the afternoon, Disp: 75 tablet, Rfl: 5 .  glucose blood (ACCU-CHEK AVIVA PLUS) test strip, CHECK BLOOD SUGAR 4 TIMES A DAY. (Patient not taking: Reported on 12/24/2018), Disp: 100 each, Rfl: 11 .  HYDROcodone-acetaminophen (NORCO/VICODIN) 5-325 MG tablet, hydrocodone 5 mg-acetaminophen 325 mg tablet, Disp: , Rfl:  .  Influenza Vac A&B Surf Ant Adj (FLUAD) 0.5 ML SUSY, Fluad 2019-20 61yr up(PF)45 mcg(15 mcgx3)/0.5 mL intramuscular syringe  ADM 0.5ML IM UTD, Disp: , Rfl:  .  Lancets MISC, Check blood sugar 4 times a day (Patient not taking: Reported on 12/24/2018),  Disp: 100 each, Rfl: 11 .  levothyroxine (SYNTHROID, LEVOTHROID) 125 MCG tablet, Take 1 tablet (125 mcg total) by mouth daily before breakfast., Disp: 90 tablet, Rfl: 1 .  Multiple Vitamin (MULTIVITAMIN) tablet, Take 1 tablet by mouth daily., Disp: , Rfl:  .  nitroGLYCERIN (NITROSTAT) 0.4 MG SL tablet, DISSOLVE 1 TABLET UNDER TONGUE AS NEEDED FOR CHEST PAIN,MAY REPEAT IN5 MINUTES FOR 2 DOSES. (Patient not taking: Reported on 12/20/2018), Disp: 25 tablet, Rfl: 3 .  OXYCODONE HCL PO, oxycodone 5 mg tablet, Disp: , Rfl:  .  Potassium Chloride ER 20 MEQ TBCR, Take 20 mEq by mouth daily., Disp: 30 tablet, Rfl: 3 .  predniSONE (DELTASONE) 10 MG tablet, prednisone 10 mg tablet, Disp: , Rfl:  .  traMADol (ULTRAM) 50 MG tablet, Take 1 tablet (50 mg total) by mouth every 6 (six) hours as needed. (Patient not taking: Reported on 12/20/2018), Disp: 12 tablet, Rfl: 0 .  UNABLE TO FIND, nifedipine ER 30 mg tablet,extended release, Disp: , Rfl:  Allergies  Allergen Reactions  . Metoprolol Shortness Of Breath, Palpitations and Other (See Comments)    Heart starts racing. Shallow breathing; pt states he also get skin irritation on his legs  . Metformin Hives and Swelling    On legs  . Metformin And Related Nausea And Vomiting      Social History   Socioeconomic History  . Marital status: Married    Spouse name: Not on file  . Number of children: Not on file  . Years of education: Not on file  . Highest education level: Not on file  Occupational History  . 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 Financial resource strain: Not on file  . Food insecurity:    Worry: Not on file    Inability: Not on file  . Transportation needs:    Medical: Not on file    Non-medical: Not on file  Tobacco Use  . Smoking status: Former Smoker    Packs/day: 1.50    Years: 35.00    Pack years: 52.50    Types: Cigarettes  . Smokeless tobacco: Never Used  . Tobacco comment: quit atleast 25 yrs ago, per pt  Substance  and Sexual Activity  . Alcohol use: Yes    Alcohol/week: 3.0 standard drinks    Types: 3 Shots of liquor per week    Comment: socially  . Drug use: No  . Sexual activity: Yes  Lifestyle  . Physical activity:    Days per week: Not on file    Minutes per session: Not on file  . Stress: Not on file  Relationships  . Social connections:    Talks on phone: Not on file    Gets together: Not on file    Attends religious service: Not on file    Active member of club or organization: Not on file    Attends meetings of clubs or organizations: Not on file    Relationship status: Not on file  . Intimate partner violence:    Fear of current or ex partner: Not on file    Emotionally abused: Not on file  Physically abused: Not on file    Forced sexual activity: Not on file  Other Topics Concern  . Not on file  Social History Narrative   Pt lives in [place] with spouse.   Retired from [employer].  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    Physical Exam Cardiovascular:     Rate and Rhythm: Normal rate and regular rhythm.     Pulses: Normal pulses.  Pulmonary:     Effort: Pulmonary effort is normal.     Breath sounds: Normal breath sounds.  Abdominal:     General: There is no distension.  Musculoskeletal: Normal range of motion.     Right lower leg: No edema.     Left lower leg: No edema.  Skin:    General: Skin is warm and dry.     Capillary Refill: Capillary refill takes less than 2 seconds.  Neurological:     Mental Status: He is alert and oriented to person, place, and time.  Psychiatric:        Mood and Affect: Mood normal.         Future Appointments  Date Time Provider [place]  02/20/2019  3:45 PM Gardiner Barefoot, DPM [facility]  03/22/2019  1:40 PM Larey Dresser, MD [facility] None    BP 116/60 (BP Location: Left Arm, Patient Position: Sitting, Cuff Size: Large)   Pulse 64   Resp 16   Wt 194  lb (88 kg)   SpO2 90%   BMI 30.38 kg/m   Weight yesterday- 195 lb Last visit weight- 193 lb  Mr Bjorkman was seen at home today and reported feeling well. He denied chest pain, SOB, headache or dizziness. He had been compliant with his medications over the past week and his weight remains stable. His medications were verified and his pillbox was refilled.   [name], EMT 01/17/19  ACTION: Home visit completed Next visit planned for 1 week

## 2019-01-23 ENCOUNTER — Other Ambulatory Visit (HOSPITAL_COMMUNITY): Payer: Self-pay

## 2019-01-23 ENCOUNTER — Other Ambulatory Visit: Payer: Self-pay | Admitting: Family Medicine

## 2019-01-23 ENCOUNTER — Other Ambulatory Visit (HOSPITAL_COMMUNITY): Payer: Self-pay | Admitting: Cardiology

## 2019-01-23 NOTE — Progress Notes (Signed)
Paramedicine Encounter    Patient ID: Andrew [name], male    DOB: 1928-02-26, 85 y.o.   MRN: [id number]   Patient Care Team: [name], MD as PCP - General (Family Medicine) Larey Dresser, MD as Consulting Physician (Cardiology) Jorge Ny, LCSW as Social Worker (Licensed Clinical Social Worker)  Patient Active Problem List   Diagnosis Date Noted  . Chronic respiratory failure with hypoxia ([place]) 09/16/2018  . Bilateral recurrent inguinal hernias 11/28/2017  . Calf pain 05/25/2016  . Carotid artery stenosis 01/12/2016  . Chronic systolic CHF (congestive heart failure) ([place]) 12/02/2015  . Cerebellar infarct ([place]) 10/16/2015  . 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 ([place]) 10/16/2015  . PVD (peripheral vascular disease) ([place])   . Hereditary and idiopathic peripheral neuropathy 10/02/2015  . Peripelvic (lymphatic) cyst   . Pernicious anemia 07/15/2015  . Bilateral renal cysts 07/12/2015  . Lung nodule, 30mm RML CT 07/12/15 07/12/2015  . Constipation 07/12/2015  . Kidney stone on right side 07/12/2015  . Stage 3 chronic kidney disease ([place]) 06/14/2015  . Chronic diastolic CHF (congestive heart failure) ([place]) 05/28/2015  . Paroxysmal atrial fibrillation ([place]) 05/28/2015  . AAA (abdominal aortic aneurysm) without rupture ([place]) 09/30/2014  . Left-sided low back pain without sciatica 07/21/2014  . Cardiomyopathy, ischemic 05/23/2014  . DM (diabetes mellitus), type 2 with renal complications ([place]) 36/64/4034  . CORONARY ATHEROSCLEROSIS NATIVE CORONARY ARTERY 11/09/2010  . Depression, major, single episode, complete remission ([place]) 03/18/2010  . Hypothyroidism 02/03/2009  . Osteoarthritis 05/27/2008  . Hyperlipidemia associated with type 2 diabetes mellitus ([place]) 06/14/2007  . Hypertension associated with diabetes ([place]) 06/14/2007  . GERD 06/14/2007    Current Outpatient Medications:  .  amiodarone (PACERONE) 200 MG tablet, Take 0.5 tablets (100 mg total) by  mouth daily., Disp: 45 tablet, Rfl: 3 .  atorvastatin (LIPITOR) 80 MG tablet, Take 1 tablet (80 mg total) by mouth daily., Disp: 30 tablet, Rfl: 6 .  carvedilol (COREG) 12.5 MG tablet, TAKE 1 TABLET BY MOUTH TWICE DAILY., Disp: 180 tablet, Rfl: 3 .  cilostazol (PLETAL) 100 MG tablet, TAKE 1 TABLET BY MOUTH TWICE DAILY., Disp: 180 tablet, Rfl: 3 .  ELIQUIS 2.5 MG TABS tablet, TAKE 1 TABLET BY MOUTH TWICE DAILY., Disp: 180 tablet, Rfl: 0 .  ezetimibe (ZETIA) 10 MG tablet, TAKE (1) TABLET DAILY., Disp: 90 tablet, Rfl: 0 .  FLUoxetine (PROZAC) 20 MG capsule, TAKE (1) CAPSULE DAILY., Disp: 90 capsule, Rfl: 0 .  furosemide (LASIX) 80 MG tablet, Take 1 and 1/2 tablets (120 mg) in the morning and 1 tablet (80 mg) in the afternoon, Disp: 75 tablet, Rfl: 5 .  glucose blood (ACCU-CHEK AVIVA PLUS) test strip, CHECK BLOOD SUGAR 4 TIMES A DAY. (Patient not taking: Reported on 12/24/2018), Disp: 100 each, Rfl: 11 .  hydrALAZINE (APRESOLINE) 50 MG tablet, Take 1 tablet (50 mg total) by mouth 3 (three) times daily., Disp: 270 tablet, Rfl: 1 .  HYDROcodone-acetaminophen (NORCO/VICODIN) 5-325 MG tablet, hydrocodone 5 mg-acetaminophen 325 mg tablet, Disp: , Rfl:  .  Influenza Vac A&B Surf Ant Adj (FLUAD) 0.5 ML SUSY, Fluad 2019-20 68yr up(PF)45 mcg(15 mcgx3)/0.5 mL intramuscular syringe  ADM 0.5ML IM UTD, Disp: , Rfl:  .  isosorbide mononitrate (IMDUR) 30 MG 24 hr tablet, Take 1 tablet (30 mg total) by mouth daily., Disp: 30 tablet, Rfl: 6 .  Lancets MISC, Check blood sugar 4 times a day (Patient not taking: Reported on 12/24/2018), Disp: 100 each, Rfl: 11 .  levothyroxine (SYNTHROID, LEVOTHROID) 125 MCG tablet, Take 1 tablet (125 mcg total) by mouth daily before breakfast., Disp: 90 tablet, Rfl: 1 .  Multiple Vitamin (MULTIVITAMIN) tablet, Take 1 tablet by mouth daily., Disp: , Rfl:  .  nitroGLYCERIN (NITROSTAT) 0.4 MG SL tablet, DISSOLVE 1 TABLET UNDER TONGUE AS NEEDED FOR CHEST PAIN,MAY REPEAT IN5 MINUTES FOR 2  DOSES. (Patient not taking: Reported on 12/20/2018), Disp: 25 tablet, Rfl: 3 .  OXYCODONE HCL PO, oxycodone 5 mg tablet, Disp: , Rfl:  .  Potassium Chloride ER 20 MEQ TBCR, Take 20 mEq by mouth daily., Disp: 30 tablet, Rfl: 3 .  potassium chloride SA (K-DUR,KLOR-CON) 20 MEQ tablet, Take 1 tablet (20 mEq total) by mouth daily., Disp: 90 tablet, Rfl: 3 .  predniSONE (DELTASONE) 10 MG tablet, prednisone 10 mg tablet, Disp: , Rfl:  .  SPIRIVA HANDIHALER 18 MCG inhalation capsule, PLACE 1 CAPSULE INTO INHALER AND INHALE ONCE DAILY., Disp: 90 capsule, Rfl: 0 .  temazepam (RESTORIL) 30 MG capsule, TAKE 1 CAPSULE AT BEDTIME AS NEEDED FOR SLEEP., Disp: 30 capsule, Rfl: 0 .  traMADol (ULTRAM) 50 MG tablet, Take 1 tablet (50 mg total) by mouth every 6 (six) hours as needed. (Patient not taking: Reported on 12/20/2018), Disp: 12 tablet, Rfl: 0 .  UNABLE TO FIND, nifedipine ER 30 mg tablet,extended release, Disp: , Rfl:  Allergies  Allergen Reactions  . Metoprolol Shortness Of Breath, Palpitations and Other (See Comments)    Heart starts racing. Shallow breathing; pt states he also get skin irritation on his legs  . Metformin Hives and Swelling    On legs  . Metformin And Related Nausea And Vomiting      Social History   Socioeconomic History  . Marital status: Married    Spouse name: Not on file  . Number of children: Not on file  . Years of education: Not on file  . Highest education level: Not on file  Occupational History  . 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 Financial resource strain: Not on file  . Food insecurity:    Worry: Not on file    Inability: Not on file  . Transportation needs:    Medical: Not on file    Non-medical: Not on file  Tobacco Use  . Smoking status: Former Smoker    Packs/day: 1.50    Years: 35.00    Pack years: 52.50    Types: Cigarettes  . Smokeless tobacco: Never Used  . Tobacco comment: quit atleast 25 yrs ago, per pt  Substance and Sexual Activity  .  Alcohol use: Yes    Alcohol/week: 3.0 standard drinks    Types: 3 Shots of liquor per week    Comment: socially  . Drug use: No  . Sexual activity: Yes  Lifestyle  . Physical activity:    Days per week: Not on file    Minutes per session: Not on file  . Stress: Not on file  Relationships  . Social connections:    Talks on phone: Not on file    Gets together: Not on file    Attends religious service: Not on file    Active member of club or organization: Not on file    Attends meetings of clubs or organizations: Not on file    Relationship status: Not on file  . Intimate partner violence:    Fear of current or ex partner: Not on file    Emotionally abused: Not on file  Physically abused: Not on file    Forced sexual activity: Not on file  Other Topics Concern  . Not on file  Social History Narrative   Pt lives in [place] with spouse.   Retired from [employer].  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    Physical Exam      Future Appointments  Date Time Provider [place]  02/20/2019  3:45 PM Gardiner Barefoot, DPM [facility]  03/22/2019  1:40 PM Larey Dresser, MD [facility] None    There were no vitals taken for this visit.  Mr Engel was seen at home today and reported feeling well. He denied chest pain, SOB, headache dizziness or increased orthopnea. He stated he has been compliant with his Medications over the past week and he thought his weight has been generally stable. He stated he had been at the dentist today due to losing a filling earlier in the week and had to have a temporary crown placed. His only complaint was pain from that procedure as his numbing medication wore off. Due to his tumultuous day, I advised that I would not bother him with obtaining vital signs and he expressed his gratitude. His medications were verified and his pillbox was refilled.   [name],  EMT 01/23/19  ACTION: Home visit completed Next visit planned for 1 week

## 2019-01-31 ENCOUNTER — Other Ambulatory Visit (HOSPITAL_COMMUNITY): Payer: Self-pay

## 2019-01-31 NOTE — Progress Notes (Signed)
Paramedicine Encounter    Patient ID: Andrew [name], male    DOB: August 07, 1928, 85 y.o.   MRN: [id number]   Patient Care Team: [name], MD as PCP - General (Family Medicine) Larey Dresser, MD as Consulting Physician (Cardiology) Jorge Ny, LCSW as Social Worker (Licensed Clinical Social Worker)  Patient Active Problem List   Diagnosis Date Noted  . Chronic respiratory failure with hypoxia ([place]) 09/16/2018  . Bilateral recurrent inguinal hernias 11/28/2017  . Calf pain 05/25/2016  . Carotid artery stenosis 01/12/2016  . Chronic systolic CHF (congestive heart failure) ([place]) 12/02/2015  . Cerebellar infarct ([place]) 10/16/2015  . 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 ([place]) 10/16/2015  . PVD (peripheral vascular disease) ([place])   . Hereditary and idiopathic peripheral neuropathy 10/02/2015  . Peripelvic (lymphatic) cyst   . Pernicious anemia 07/15/2015  . Bilateral renal cysts 07/12/2015  . Lung nodule, 72mm RML CT 07/12/15 07/12/2015  . Constipation 07/12/2015  . Kidney stone on right side 07/12/2015  . Stage 3 chronic kidney disease ([place]) 06/14/2015  . Chronic diastolic CHF (congestive heart failure) ([place]) 05/28/2015  . Paroxysmal atrial fibrillation ([place]) 05/28/2015  . AAA (abdominal aortic aneurysm) without rupture ([place]) 09/30/2014  . Left-sided low back pain without sciatica 07/21/2014  . Cardiomyopathy, ischemic 05/23/2014  . DM (diabetes mellitus), type 2 with renal complications ([place]) 35/46/5681  . CORONARY ATHEROSCLEROSIS NATIVE CORONARY ARTERY 11/09/2010  . Depression, major, single episode, complete remission ([place]) 03/18/2010  . Hypothyroidism 02/03/2009  . Osteoarthritis 05/27/2008  . Hyperlipidemia associated with type 2 diabetes mellitus ([place]) 06/14/2007  . Hypertension associated with diabetes ([place]) 06/14/2007  . GERD 06/14/2007    Current Outpatient Medications:  .  amiodarone (PACERONE) 200 MG tablet, Take 0.5 tablets (100 mg total) by  mouth daily., Disp: 45 tablet, Rfl: 3 .  atorvastatin (LIPITOR) 80 MG tablet, Take 1 tablet (80 mg total) by mouth daily., Disp: 30 tablet, Rfl: 6 .  carvedilol (COREG) 12.5 MG tablet, TAKE 1 TABLET BY MOUTH TWICE DAILY., Disp: 180 tablet, Rfl: 3 .  cilostazol (PLETAL) 100 MG tablet, TAKE 1 TABLET BY MOUTH TWICE DAILY., Disp: 180 tablet, Rfl: 3 .  ELIQUIS 2.5 MG TABS tablet, TAKE 1 TABLET BY MOUTH TWICE DAILY., Disp: 180 tablet, Rfl: 0 .  ezetimibe (ZETIA) 10 MG tablet, TAKE (1) TABLET DAILY., Disp: 90 tablet, Rfl: 0 .  FLUoxetine (PROZAC) 20 MG capsule, TAKE (1) CAPSULE DAILY., Disp: 90 capsule, Rfl: 0 .  furosemide (LASIX) 80 MG tablet, Take 1 and 1/2 tablets (120 mg) in the morning and 1 tablet (80 mg) in the afternoon, Disp: 75 tablet, Rfl: 5 .  hydrALAZINE (APRESOLINE) 50 MG tablet, Take 1 tablet (50 mg total) by mouth 3 (three) times daily., Disp: 270 tablet, Rfl: 1 .  HYDROcodone-acetaminophen (NORCO/VICODIN) 5-325 MG tablet, hydrocodone 5 mg-acetaminophen 325 mg tablet, Disp: , Rfl:  .  isosorbide mononitrate (IMDUR) 30 MG 24 hr tablet, Take 1 tablet (30 mg total) by mouth daily., Disp: 30 tablet, Rfl: 6 .  levothyroxine (SYNTHROID, LEVOTHROID) 125 MCG tablet, Take 1 tablet (125 mcg total) by mouth daily before breakfast., Disp: 90 tablet, Rfl: 1 .  Multiple Vitamin (MULTIVITAMIN) tablet, Take 1 tablet by mouth daily., Disp: , Rfl:  .  potassium chloride SA (K-DUR,KLOR-CON) 20 MEQ tablet, Take 1 tablet (20 mEq total) by mouth daily., Disp: 90 tablet, Rfl: 3 .  SPIRIVA HANDIHALER 18 MCG inhalation capsule, PLACE 1 CAPSULE INTO INHALER AND INHALE ONCE DAILY.,  Disp: 90 capsule, Rfl: 0 .  temazepam (RESTORIL) 30 MG capsule, TAKE 1 CAPSULE AT BEDTIME AS NEEDED FOR SLEEP., Disp: 30 capsule, Rfl: 0 .  glucose blood (ACCU-CHEK AVIVA PLUS) test strip, CHECK BLOOD SUGAR 4 TIMES A DAY., Disp: 100 each, Rfl: 11 .  Influenza Vac A&B Surf Ant Adj (FLUAD) 0.5 ML SUSY, Fluad 2019-20 23yr up(PF)45 mcg(15  mcgx3)/0.5 mL intramuscular syringe  ADM 0.5ML IM UTD, Disp: , Rfl:  .  Lancets MISC, Check blood sugar 4 times a day (Patient not taking: Reported on 12/24/2018), Disp: 100 each, Rfl: 11 .  nitroGLYCERIN (NITROSTAT) 0.4 MG SL tablet, DISSOLVE 1 TABLET UNDER TONGUE AS NEEDED FOR CHEST PAIN,MAY REPEAT IN5 MINUTES FOR 2 DOSES. (Patient not taking: Reported on 12/20/2018), Disp: 25 tablet, Rfl: 3 .  OXYCODONE HCL PO, oxycodone 5 mg tablet, Disp: , Rfl:  .  Potassium Chloride ER 20 MEQ TBCR, Take 20 mEq by mouth daily., Disp: 30 tablet, Rfl: 3 .  predniSONE (DELTASONE) 10 MG tablet, prednisone 10 mg tablet, Disp: , Rfl:  .  traMADol (ULTRAM) 50 MG tablet, Take 1 tablet (50 mg total) by mouth every 6 (six) hours as needed. (Patient not taking: Reported on 12/20/2018), Disp: 12 tablet, Rfl: 0 .  UNABLE TO FIND, nifedipine ER 30 mg tablet,extended release, Disp: , Rfl:  Allergies  Allergen Reactions  . Metoprolol Shortness Of Breath, Palpitations and Other (See Comments)    Heart starts racing. Shallow breathing; pt states he also get skin irritation on his legs  . Metformin Hives and Swelling    On legs  . Metformin And Related Nausea And Vomiting      Social History   Socioeconomic History  . Marital status: Married    Spouse name: Not on file  . Number of children: Not on file  . Years of education: Not on file  . Highest education level: Not on file  Occupational History  . 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 Financial resource strain: Not on file  . Food insecurity:    Worry: Not on file    Inability: Not on file  . Transportation needs:    Medical: Not on file    Non-medical: Not on file  Tobacco Use  . Smoking status: Former Smoker    Packs/day: 1.50    Years: 35.00    Pack years: 52.50    Types: Cigarettes  . Smokeless tobacco: Never Used  . Tobacco comment: quit atleast 25 yrs ago, per pt  Substance and Sexual Activity  . Alcohol use: Yes    Alcohol/week: 3.0  standard drinks    Types: 3 Shots of liquor per week    Comment: socially  . Drug use: No  . Sexual activity: Yes  Lifestyle  . Physical activity:    Days per week: Not on file    Minutes per session: Not on file  . Stress: Not on file  Relationships  . Social connections:    Talks on phone: Not on file    Gets together: Not on file    Attends religious service: Not on file    Active member of club or organization: Not on file    Attends meetings of clubs or organizations: Not on file    Relationship status: Not on file  . Intimate partner violence:    Fear of current or ex partner: Not on file    Emotionally abused: Not on file    Physically abused: Not on file  Forced sexual activity: Not on file  Other Topics Concern  . Not on file  Social History Narrative   Pt lives in [place] with spouse.   Retired from [employer].  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    Physical Exam Cardiovascular:     Rate and Rhythm: Normal rate and regular rhythm.     Pulses: Normal pulses.  Pulmonary:     Effort: Pulmonary effort is normal.     Breath sounds: Normal breath sounds.  Musculoskeletal: Normal range of motion.     Right lower leg: No edema.     Left lower leg: No edema.  Skin:    General: Skin is warm and dry.     Capillary Refill: Capillary refill takes less than 2 seconds.  Neurological:     Mental Status: He is alert and oriented to person, place, and time.  Psychiatric:        Mood and Affect: Mood normal.         Future Appointments  Date Time Provider [place]  02/20/2019  3:45 PM Gardiner Barefoot, DPM [facility]  03/22/2019  1:40 PM Larey Dresser, MD [facility] None    BP 102/60 (BP Location: Right Arm, Patient Position: Sitting, Cuff Size: Large)   Pulse 74   Resp 16   Wt 192 lb 3.2 oz (87.2 kg)   SpO2 93%   BMI 30.10 kg/m   Weight yesterday- Last visit weight- 194 lb  Mr  Domangue was seen at home today and reported feeling generally well. He denied chest pain, SOB, headache, dizziness or orthopnea. He did say he has been having trouble sleeping due to having to urinate frequent. As a result we are moving his evening lasix to an afternoon dose. I explained this to Mr Stocks and her expressed understanding. He stated he had been compliant with his medications over the past week and his weight has been stable. His medications were verified and his pillbox was refilled.   [name], EMT 01/31/19  ACTION: Home visit completed Next visit planned for 1 week

## 2019-02-07 ENCOUNTER — Other Ambulatory Visit (HOSPITAL_COMMUNITY): Payer: Self-pay

## 2019-02-07 NOTE — Progress Notes (Signed)
I saw Andrew [name] at home today. He was working with a Ship broker so I just verified his medications and refilled his pillbox. His wife, who was present, denied any changes in Andrew [name]'s health over the past week. I advised that I would follow up next week and they could call me if there were any problems. They were understanding and agreeable.

## 2019-02-14 ENCOUNTER — Other Ambulatory Visit (HOSPITAL_COMMUNITY): Payer: Self-pay

## 2019-02-14 NOTE — Progress Notes (Signed)
Paramedicine Encounter    Patient ID: Andrew [name], male    DOB: 31-Jan-1928, 85 y.o.   MRN: [id number]   Patient Care Team: [name], MD as PCP - General (Family Medicine) Larey Dresser, MD as Consulting Physician (Cardiology) Jorge Ny, LCSW as Social Worker (Licensed Clinical Social Worker)  Patient Active Problem List   Diagnosis Date Noted  . Chronic respiratory failure with hypoxia ([place]) 09/16/2018  . Bilateral recurrent inguinal hernias 11/28/2017  . Calf pain 05/25/2016  . Carotid artery stenosis 01/12/2016  . Chronic systolic CHF (congestive heart failure) ([place]) 12/02/2015  . Cerebellar infarct ([place]) 10/16/2015  . 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 ([place]) 10/16/2015  . PVD (peripheral vascular disease) ([place])   . Hereditary and idiopathic peripheral neuropathy 10/02/2015  . Peripelvic (lymphatic) cyst   . Pernicious anemia 07/15/2015  . Bilateral renal cysts 07/12/2015  . Lung nodule, 20mm RML CT 07/12/15 07/12/2015  . Constipation 07/12/2015  . Kidney stone on right side 07/12/2015  . Stage 3 chronic kidney disease ([place]) 06/14/2015  . Chronic diastolic CHF (congestive heart failure) ([place]) 05/28/2015  . Paroxysmal atrial fibrillation ([place]) 05/28/2015  . AAA (abdominal aortic aneurysm) without rupture ([place]) 09/30/2014  . Left-sided low back pain without sciatica 07/21/2014  . Cardiomyopathy, ischemic 05/23/2014  . DM (diabetes mellitus), type 2 with renal complications ([place]) 62/94/7654  . CORONARY ATHEROSCLEROSIS NATIVE CORONARY ARTERY 11/09/2010  . Depression, major, single episode, complete remission ([place]) 03/18/2010  . Hypothyroidism 02/03/2009  . Osteoarthritis 05/27/2008  . Hyperlipidemia associated with type 2 diabetes mellitus ([place]) 06/14/2007  . Hypertension associated with diabetes ([place]) 06/14/2007  . GERD 06/14/2007    Current Outpatient Medications:  .  amiodarone (PACERONE) 200 MG tablet, Take 0.5 tablets (100 mg total) by  mouth daily., Disp: 45 tablet, Rfl: 3 .  atorvastatin (LIPITOR) 80 MG tablet, Take 1 tablet (80 mg total) by mouth daily., Disp: 30 tablet, Rfl: 6 .  carvedilol (COREG) 12.5 MG tablet, TAKE 1 TABLET BY MOUTH TWICE DAILY., Disp: 180 tablet, Rfl: 3 .  cilostazol (PLETAL) 100 MG tablet, TAKE 1 TABLET BY MOUTH TWICE DAILY., Disp: 180 tablet, Rfl: 3 .  ELIQUIS 2.5 MG TABS tablet, TAKE 1 TABLET BY MOUTH TWICE DAILY., Disp: 180 tablet, Rfl: 0 .  ezetimibe (ZETIA) 10 MG tablet, TAKE (1) TABLET DAILY., Disp: 90 tablet, Rfl: 0 .  FLUoxetine (PROZAC) 20 MG capsule, TAKE (1) CAPSULE DAILY., Disp: 90 capsule, Rfl: 0 .  furosemide (LASIX) 80 MG tablet, Take 1 and 1/2 tablets (120 mg) in the morning and 1 tablet (80 mg) in the afternoon, Disp: 75 tablet, Rfl: 5 .  glucose blood (ACCU-CHEK AVIVA PLUS) test strip, CHECK BLOOD SUGAR 4 TIMES A DAY., Disp: 100 each, Rfl: 11 .  hydrALAZINE (APRESOLINE) 50 MG tablet, Take 1 tablet (50 mg total) by mouth 3 (three) times daily., Disp: 270 tablet, Rfl: 1 .  HYDROcodone-acetaminophen (NORCO/VICODIN) 5-325 MG tablet, hydrocodone 5 mg-acetaminophen 325 mg tablet, Disp: , Rfl:  .  isosorbide mononitrate (IMDUR) 30 MG 24 hr tablet, Take 1 tablet (30 mg total) by mouth daily., Disp: 30 tablet, Rfl: 6 .  levothyroxine (SYNTHROID, LEVOTHROID) 125 MCG tablet, Take 1 tablet (125 mcg total) by mouth daily before breakfast., Disp: 90 tablet, Rfl: 1 .  Multiple Vitamin (MULTIVITAMIN) tablet, Take 1 tablet by mouth daily., Disp: , Rfl:  .  potassium chloride SA (K-DUR,KLOR-CON) 20 MEQ tablet, Take 1 tablet (20 mEq total) by mouth daily., Disp:  90 tablet, Rfl: 3 .  SPIRIVA HANDIHALER 18 MCG inhalation capsule, PLACE 1 CAPSULE INTO INHALER AND INHALE ONCE DAILY., Disp: 90 capsule, Rfl: 0 .  temazepam (RESTORIL) 30 MG capsule, TAKE 1 CAPSULE AT BEDTIME AS NEEDED FOR SLEEP., Disp: 30 capsule, Rfl: 0 .  Influenza Vac A&B Surf Ant Adj (FLUAD) 0.5 ML SUSY, Fluad 2019-20 53yr up(PF)45 mcg(15  mcgx3)/0.5 mL intramuscular syringe  ADM 0.5ML IM UTD, Disp: , Rfl:  .  Lancets MISC, Check blood sugar 4 times a day (Patient not taking: Reported on 12/24/2018), Disp: 100 each, Rfl: 11 .  nitroGLYCERIN (NITROSTAT) 0.4 MG SL tablet, DISSOLVE 1 TABLET UNDER TONGUE AS NEEDED FOR CHEST PAIN,MAY REPEAT IN5 MINUTES FOR 2 DOSES. (Patient not taking: Reported on 12/20/2018), Disp: 25 tablet, Rfl: 3 .  OXYCODONE HCL PO, oxycodone 5 mg tablet, Disp: , Rfl:  .  Potassium Chloride ER 20 MEQ TBCR, Take 20 mEq by mouth daily., Disp: 30 tablet, Rfl: 3 .  predniSONE (DELTASONE) 10 MG tablet, prednisone 10 mg tablet, Disp: , Rfl:  .  traMADol (ULTRAM) 50 MG tablet, Take 1 tablet (50 mg total) by mouth every 6 (six) hours as needed. (Patient not taking: Reported on 12/20/2018), Disp: 12 tablet, Rfl: 0 .  UNABLE TO FIND, nifedipine ER 30 mg tablet,extended release, Disp: , Rfl:  Allergies  Allergen Reactions  . Metoprolol Shortness Of Breath, Palpitations and Other (See Comments)    Heart starts racing. Shallow breathing; pt states he also get skin irritation on his legs  . Metformin Hives and Swelling    On legs  . Metformin And Related Nausea And Vomiting      Social History   Socioeconomic History  . Marital status: Married    Spouse name: Not on file  . Number of children: Not on file  . Years of education: Not on file  . Highest education level: Not on file  Occupational History  . 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 Financial resource strain: Not on file  . Food insecurity:    Worry: Not on file    Inability: Not on file  . Transportation needs:    Medical: Not on file    Non-medical: Not on file  Tobacco Use  . Smoking status: Former Smoker    Packs/day: 1.50    Years: 35.00    Pack years: 52.50    Types: Cigarettes  . Smokeless tobacco: Never Used  . Tobacco comment: quit atleast 25 yrs ago, per pt  Substance and Sexual Activity  . Alcohol use: Yes    Alcohol/week: 3.0  standard drinks    Types: 3 Shots of liquor per week    Comment: socially  . Drug use: No  . Sexual activity: Yes  Lifestyle  . Physical activity:    Days per week: Not on file    Minutes per session: Not on file  . Stress: Not on file  Relationships  . Social connections:    Talks on phone: Not on file    Gets together: Not on file    Attends religious service: Not on file    Active member of club or organization: Not on file    Attends meetings of clubs or organizations: Not on file    Relationship status: Not on file  . Intimate partner violence:    Fear of current or ex partner: Not on file    Emotionally abused: Not on file    Physically abused: Not on file  Forced sexual activity: Not on file  Other Topics Concern  . Not on file  Social History Narrative   Pt lives in [place] with spouse.   Retired from [employer].  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    Physical Exam Cardiovascular:     Rate and Rhythm: Normal rate and regular rhythm.     Pulses: Normal pulses.  Pulmonary:     Effort: Pulmonary effort is normal.     Breath sounds: Normal breath sounds.  Musculoskeletal: Normal range of motion.     Right lower leg: No edema.     Left lower leg: No edema.  Skin:    General: Skin is warm and dry.     Capillary Refill: Capillary refill takes less than 2 seconds.  Neurological:     Mental Status: He is alert and oriented to person, place, and time.  Psychiatric:        Mood and Affect: Mood normal.         Future Appointments  Date Time Provider [place]  02/20/2019  3:45 PM Gardiner Barefoot, DPM [facility]  03/22/2019  1:40 PM Larey Dresser, MD [facility] None    BP 134/74 (BP Location: Left Arm, Patient Position: Sitting, Cuff Size: Normal)   Pulse 70   Resp 16   Wt 193 lb (87.5 kg)   SpO2 92%   BMI 30.23 kg/m   Weight yesterday- 193 lb Last visit weight- 193 lb  Mr  Allman was seen at home today and reported feeling well. He denied chest pain, SOB, headache, dizziness or orthopnea. He stated he has been compliant with his medications however there was one day where he missed his afternoon and evening medications. His weight has been stable. His medications were verified and his pillbox was refilled.  [name], EMT 02/14/19  ACTION: Home visit completed Next visit planned for 1 week

## 2019-02-20 ENCOUNTER — Ambulatory Visit: Payer: Medicare Other | Admitting: Podiatry

## 2019-02-20 ENCOUNTER — Encounter: Payer: Self-pay | Admitting: Podiatry

## 2019-02-20 DIAGNOSIS — B351 Tinea unguium: Secondary | ICD-10-CM

## 2019-02-20 DIAGNOSIS — M79676 Pain in unspecified toe(s): Secondary | ICD-10-CM

## 2019-02-20 DIAGNOSIS — I739 Peripheral vascular disease, unspecified: Secondary | ICD-10-CM | POA: Diagnosis not present

## 2019-02-20 DIAGNOSIS — E1159 Type 2 diabetes mellitus with other circulatory complications: Secondary | ICD-10-CM

## 2019-02-20 NOTE — Progress Notes (Signed)
Patient ID: Andrew [name], male   DOB: February 17, 1928, 85 y.o.   MRN: [id number] Complaint:  Visit Type: Patient returns to my office for continued preventative foot care services. Complaint: Patient states" my nails have grown long and thick and become painful to walk and wear shoes" Patient has been diagnosed with DM with no foot complications. The patient presents for preventative foot care services. No changes to ROS.  Patient is on eliquiss and pletal.  Podiatric Exam: Vascular: dorsalis pedis and posterior tibial pulses are palpable bilateral. Capillary return is immediate. Temperature gradient is WNL. Skin turgor WNL  Sensorium: Normal Semmes Weinstein monofilament test. Normal tactile sensation bilaterally. Nail Exam: Pt has thick disfigured discolored nails with subungual debris noted bilateral entire nail hallux through fifth toenails Ulcer Exam: There is no evidence of ulcer or pre-ulcerative changes or infection. Orthopedic Exam: Muscle tone and strength are WNL. No limitations in general ROM. No crepitus or effusions noted. Foot type and digits show no abnormalities. Bony prominences are unremarkable. 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 Skin: No Porokeratosis. No infection or ulcers.  Asymptomatic callus right foot  Diagnosis:  Onychomycosis, , Pain in right toe, pain in left toes  Treatment & Plan Procedures and Treatment: Consent by patient was obtained for treatment procedures. The patient understood the discussion of treatment and procedures well. All questions were answered thoroughly reviewed. Debridement of mycotic and hypertrophic toenails, 1 through 5 bilateral and clearing of subungual debris. No ulceration, no infection noted.  Return Visit-Office Procedure: Patient instructed to return to the office for a follow up visit 10 weeks. for continued evaluation and treatment.   Gardiner Barefoot DPM

## 2019-02-21 ENCOUNTER — Other Ambulatory Visit (HOSPITAL_COMMUNITY): Payer: Self-pay

## 2019-02-21 NOTE — Progress Notes (Signed)
Paramedicine Encounter    Patient ID: Andrew [name], male    DOB: Nov 09, 1928, 85 y.o.   MRN: [id number]   Patient Care Team: [name], MD as PCP - General (Family Medicine) Larey Dresser, MD as Consulting Physician (Cardiology) Jorge Ny, LCSW as Social Worker (Licensed Clinical Social Worker)  Patient Active Problem List   Diagnosis Date Noted  . Chronic respiratory failure with hypoxia ([place]) 09/16/2018  . Bilateral recurrent inguinal hernias 11/28/2017  . Calf pain 05/25/2016  . Carotid artery stenosis 01/12/2016  . Chronic systolic CHF (congestive heart failure) ([place]) 12/02/2015  . Cerebellar infarct ([place]) 10/16/2015  . 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 ([place]) 10/16/2015  . PVD (peripheral vascular disease) ([place])   . Hereditary and idiopathic peripheral neuropathy 10/02/2015  . Peripelvic (lymphatic) cyst   . Pernicious anemia 07/15/2015  . Bilateral renal cysts 07/12/2015  . Lung nodule, 87mm RML CT 07/12/15 07/12/2015  . Constipation 07/12/2015  . Kidney stone on right side 07/12/2015  . Stage 3 chronic kidney disease ([place]) 06/14/2015  . Chronic diastolic CHF (congestive heart failure) ([place]) 05/28/2015  . Paroxysmal atrial fibrillation ([place]) 05/28/2015  . AAA (abdominal aortic aneurysm) without rupture ([place]) 09/30/2014  . Left-sided low back pain without sciatica 07/21/2014  . Cardiomyopathy, ischemic 05/23/2014  . DM (diabetes mellitus), type 2 with renal complications ([place]) 40/98/1191  . CORONARY ATHEROSCLEROSIS NATIVE CORONARY ARTERY 11/09/2010  . Depression, major, single episode, complete remission ([place]) 03/18/2010  . Hypothyroidism 02/03/2009  . Osteoarthritis 05/27/2008  . Hyperlipidemia associated with type 2 diabetes mellitus ([place]) 06/14/2007  . Hypertension associated with diabetes ([place]) 06/14/2007  . GERD 06/14/2007    Current Outpatient Medications:  .  amiodarone (PACERONE) 200 MG tablet, Take 0.5 tablets (100 mg total) by  mouth daily., Disp: 45 tablet, Rfl: 3 .  atorvastatin (LIPITOR) 80 MG tablet, Take 1 tablet (80 mg total) by mouth daily., Disp: 30 tablet, Rfl: 6 .  carvedilol (COREG) 12.5 MG tablet, TAKE 1 TABLET BY MOUTH TWICE DAILY., Disp: 180 tablet, Rfl: 3 .  cilostazol (PLETAL) 100 MG tablet, TAKE 1 TABLET BY MOUTH TWICE DAILY., Disp: 180 tablet, Rfl: 3 .  ELIQUIS 2.5 MG TABS tablet, TAKE 1 TABLET BY MOUTH TWICE DAILY., Disp: 180 tablet, Rfl: 0 .  ezetimibe (ZETIA) 10 MG tablet, TAKE (1) TABLET DAILY., Disp: 90 tablet, Rfl: 0 .  FLUoxetine (PROZAC) 20 MG capsule, TAKE (1) CAPSULE DAILY., Disp: 90 capsule, Rfl: 0 .  furosemide (LASIX) 80 MG tablet, Take 1 and 1/2 tablets (120 mg) in the morning and 1 tablet (80 mg) in the afternoon, Disp: 75 tablet, Rfl: 5 .  hydrALAZINE (APRESOLINE) 50 MG tablet, Take 1 tablet (50 mg total) by mouth 3 (three) times daily., Disp: 270 tablet, Rfl: 1 .  HYDROcodone-acetaminophen (NORCO/VICODIN) 5-325 MG tablet, hydrocodone 5 mg-acetaminophen 325 mg tablet, Disp: , Rfl:  .  isosorbide mononitrate (IMDUR) 30 MG 24 hr tablet, Take 1 tablet (30 mg total) by mouth daily., Disp: 30 tablet, Rfl: 6 .  levothyroxine (SYNTHROID, LEVOTHROID) 125 MCG tablet, Take 1 tablet (125 mcg total) by mouth daily before breakfast., Disp: 90 tablet, Rfl: 1 .  Multiple Vitamin (MULTIVITAMIN) tablet, Take 1 tablet by mouth daily., Disp: , Rfl:  .  Potassium Chloride ER 20 MEQ TBCR, Take 20 mEq by mouth daily., Disp: 30 tablet, Rfl: 3 .  potassium chloride SA (K-DUR,KLOR-CON) 20 MEQ tablet, Take 1 tablet (20 mEq total) by mouth daily., Disp: 90 tablet,  Rfl: 3 .  SPIRIVA HANDIHALER 18 MCG inhalation capsule, PLACE 1 CAPSULE INTO INHALER AND INHALE ONCE DAILY., Disp: 90 capsule, Rfl: 0 .  temazepam (RESTORIL) 30 MG capsule, TAKE 1 CAPSULE AT BEDTIME AS NEEDED FOR SLEEP., Disp: 30 capsule, Rfl: 0 .  glucose blood (ACCU-CHEK AVIVA PLUS) test strip, CHECK BLOOD SUGAR 4 TIMES A DAY., Disp: 100 each, Rfl: 11 .   Influenza Vac A&B Surf Ant Adj (FLUAD) 0.5 ML SUSY, Fluad 2019-20 78yr up(PF)45 mcg(15 mcgx3)/0.5 mL intramuscular syringe  ADM 0.5ML IM UTD, Disp: , Rfl:  .  Lancets MISC, Check blood sugar 4 times a day, Disp: 100 each, Rfl: 11 .  nitroGLYCERIN (NITROSTAT) 0.4 MG SL tablet, DISSOLVE 1 TABLET UNDER TONGUE AS NEEDED FOR CHEST PAIN,MAY REPEAT IN5 MINUTES FOR 2 DOSES. (Patient not taking: Reported on 02/21/2019), Disp: 25 tablet, Rfl: 3 .  OXYCODONE HCL PO, oxycodone 5 mg tablet, Disp: , Rfl:  .  predniSONE (DELTASONE) 10 MG tablet, prednisone 10 mg tablet, Disp: , Rfl:  .  traMADol (ULTRAM) 50 MG tablet, Take 1 tablet (50 mg total) by mouth every 6 (six) hours as needed. (Patient not taking: Reported on 02/21/2019), Disp: 12 tablet, Rfl: 0 .  UNABLE TO FIND, nifedipine ER 30 mg tablet,extended release, Disp: , Rfl:  Allergies  Allergen Reactions  . Metoprolol Shortness Of Breath, Palpitations and Other (See Comments)    Heart starts racing. Shallow breathing; pt states he also get skin irritation on his legs  . Metformin Hives and Swelling    On legs  . Metformin And Related Nausea And Vomiting      Social History   Socioeconomic History  . Marital status: Married    Spouse name: Not on file  . Number of children: Not on file  . Years of education: Not on file  . Highest education level: Not on file  Occupational History  . 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 Financial resource strain: Not on file  . Food insecurity:    Worry: Not on file    Inability: Not on file  . Transportation needs:    Medical: Not on file    Non-medical: Not on file  Tobacco Use  . Smoking status: Former Smoker    Packs/day: 1.50    Years: 35.00    Pack years: 52.50    Types: Cigarettes  . Smokeless tobacco: Never Used  . Tobacco comment: quit atleast 25 yrs ago, per pt  Substance and Sexual Activity  . Alcohol use: Yes    Alcohol/week: 3.0 standard drinks    Types: 3 Shots of liquor per week     Comment: socially  . Drug use: No  . Sexual activity: Yes  Lifestyle  . Physical activity:    Days per week: Not on file    Minutes per session: Not on file  . Stress: Not on file  Relationships  . Social connections:    Talks on phone: Not on file    Gets together: Not on file    Attends religious service: Not on file    Active member of club or organization: Not on file    Attends meetings of clubs or organizations: Not on file    Relationship status: Not on file  . Intimate partner violence:    Fear of current or ex partner: Not on file    Emotionally abused: Not on file    Physically abused: Not on file    Forced sexual activity:  Not on file  Other Topics Concern  . Not on file  Social History Narrative   Pt lives in [place] with spouse.   Retired from [employer].  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    Physical Exam Cardiovascular:     Rate and Rhythm: Normal rate and regular rhythm.     Pulses: Normal pulses.  Pulmonary:     Effort: Pulmonary effort is normal.     Breath sounds: Normal breath sounds.  Musculoskeletal: Normal range of motion.     Right lower leg: No edema.     Left lower leg: No edema.  Skin:    General: Skin is warm and dry.     Capillary Refill: Capillary refill takes less than 2 seconds.  Neurological:     Mental Status: He is alert and oriented to person, place, and time.  Psychiatric:        Mood and Affect: Mood normal.         Future Appointments  Date Time Provider [place]  03/22/2019  1:40 PM Larey Dresser, MD [facility] None  05/22/2019  2:45 PM [place] Barefoot, DPM [facility]    BP 100/66 (BP Location: Right Arm, Patient Position: Sitting, Cuff Size: Large)   Pulse 70   Resp 16   Wt 191 lb (86.6 kg)   SpO2 93%   BMI 29.91 kg/m   Weight yesterday- 192 Last visit weight- 193  Mr Aldridge was seen at home today and reported feeling generally well. He  denied chest pain, SOB, headache, dizziness or increased orthopnea. He had been mostly compliant with his medications over the past week, having missed to morning slots and two evening slots of medications. His weight is slightly down over the week but generally stable. His medications were verified and his pillbox was refilled.   [name], EMT 02/21/19  ACTION: Home visit completed Next visit planned for 1 week

## 2019-02-28 ENCOUNTER — Other Ambulatory Visit (HOSPITAL_COMMUNITY): Payer: Self-pay

## 2019-02-28 NOTE — Progress Notes (Signed)
Paramedicine Encounter    Patient ID: Andrew [name], male    DOB: 12-15-1928, 85 y.o.   MRN: [id number]   Patient Care Team: [name], MD as PCP - General (Family Medicine) Larey Dresser, MD as Consulting Physician (Cardiology) Jorge Ny, LCSW as Social Worker (Licensed Clinical Social Worker)  Patient Active Problem List   Diagnosis Date Noted  . Chronic respiratory failure with hypoxia ([place]) 09/16/2018  . Bilateral recurrent inguinal hernias 11/28/2017  . Calf pain 05/25/2016  . Carotid artery stenosis 01/12/2016  . Chronic systolic CHF (congestive heart failure) ([place]) 12/02/2015  . Cerebellar infarct ([place]) 10/16/2015  . 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 ([place]) 10/16/2015  . PVD (peripheral vascular disease) ([place])   . Hereditary and idiopathic peripheral neuropathy 10/02/2015  . Peripelvic (lymphatic) cyst   . Pernicious anemia 07/15/2015  . Bilateral renal cysts 07/12/2015  . Lung nodule, 62mm RML CT 07/12/15 07/12/2015  . Constipation 07/12/2015  . Kidney stone on right side 07/12/2015  . Stage 3 chronic kidney disease ([place]) 06/14/2015  . Chronic diastolic CHF (congestive heart failure) ([place]) 05/28/2015  . Paroxysmal atrial fibrillation ([place]) 05/28/2015  . AAA (abdominal aortic aneurysm) without rupture ([place]) 09/30/2014  . Left-sided low back pain without sciatica 07/21/2014  . Cardiomyopathy, ischemic 05/23/2014  . DM (diabetes mellitus), type 2 with renal complications ([place]) 13/24/4010  . CORONARY ATHEROSCLEROSIS NATIVE CORONARY ARTERY 11/09/2010  . Depression, major, single episode, complete remission ([place]) 03/18/2010  . Hypothyroidism 02/03/2009  . Osteoarthritis 05/27/2008  . Hyperlipidemia associated with type 2 diabetes mellitus ([place]) 06/14/2007  . Hypertension associated with diabetes ([place]) 06/14/2007  . GERD 06/14/2007    Current Outpatient Medications:  .  amiodarone (PACERONE) 200 MG tablet, Take 0.5 tablets (100 mg total) by  mouth daily., Disp: 45 tablet, Rfl: 3 .  atorvastatin (LIPITOR) 80 MG tablet, Take 1 tablet (80 mg total) by mouth daily., Disp: 30 tablet, Rfl: 6 .  carvedilol (COREG) 12.5 MG tablet, TAKE 1 TABLET BY MOUTH TWICE DAILY., Disp: 180 tablet, Rfl: 3 .  cilostazol (PLETAL) 100 MG tablet, TAKE 1 TABLET BY MOUTH TWICE DAILY., Disp: 180 tablet, Rfl: 3 .  ELIQUIS 2.5 MG TABS tablet, TAKE 1 TABLET BY MOUTH TWICE DAILY., Disp: 180 tablet, Rfl: 0 .  ezetimibe (ZETIA) 10 MG tablet, TAKE (1) TABLET DAILY., Disp: 90 tablet, Rfl: 0 .  FLUoxetine (PROZAC) 20 MG capsule, TAKE (1) CAPSULE DAILY., Disp: 90 capsule, Rfl: 0 .  furosemide (LASIX) 80 MG tablet, Take 1 and 1/2 tablets (120 mg) in the morning and 1 tablet (80 mg) in the afternoon, Disp: 75 tablet, Rfl: 5 .  glucose blood (ACCU-CHEK AVIVA PLUS) test strip, CHECK BLOOD SUGAR 4 TIMES A DAY., Disp: 100 each, Rfl: 11 .  hydrALAZINE (APRESOLINE) 50 MG tablet, Take 1 tablet (50 mg total) by mouth 3 (three) times daily., Disp: 270 tablet, Rfl: 1 .  HYDROcodone-acetaminophen (NORCO/VICODIN) 5-325 MG tablet, hydrocodone 5 mg-acetaminophen 325 mg tablet, Disp: , Rfl:  .  isosorbide mononitrate (IMDUR) 30 MG 24 hr tablet, Take 1 tablet (30 mg total) by mouth daily., Disp: 30 tablet, Rfl: 6 .  levothyroxine (SYNTHROID, LEVOTHROID) 125 MCG tablet, Take 1 tablet (125 mcg total) by mouth daily before breakfast., Disp: 90 tablet, Rfl: 1 .  Multiple Vitamin (MULTIVITAMIN) tablet, Take 1 tablet by mouth daily., Disp: , Rfl:  .  potassium chloride SA (K-DUR,KLOR-CON) 20 MEQ tablet, Take 1 tablet (20 mEq total) by mouth daily., Disp:  90 tablet, Rfl: 3 .  SPIRIVA HANDIHALER 18 MCG inhalation capsule, PLACE 1 CAPSULE INTO INHALER AND INHALE ONCE DAILY., Disp: 90 capsule, Rfl: 0 .  temazepam (RESTORIL) 30 MG capsule, TAKE 1 CAPSULE AT BEDTIME AS NEEDED FOR SLEEP., Disp: 30 capsule, Rfl: 0 .  Influenza Vac A&B Surf Ant Adj (FLUAD) 0.5 ML SUSY, Fluad 2019-20 66yr up(PF)45 mcg(15  mcgx3)/0.5 mL intramuscular syringe  ADM 0.5ML IM UTD, Disp: , Rfl:  .  Lancets MISC, Check blood sugar 4 times a day, Disp: 100 each, Rfl: 11 .  nitroGLYCERIN (NITROSTAT) 0.4 MG SL tablet, DISSOLVE 1 TABLET UNDER TONGUE AS NEEDED FOR CHEST PAIN,MAY REPEAT IN5 MINUTES FOR 2 DOSES. (Patient not taking: Reported on 02/21/2019), Disp: 25 tablet, Rfl: 3 .  OXYCODONE HCL PO, oxycodone 5 mg tablet, Disp: , Rfl:  .  Potassium Chloride ER 20 MEQ TBCR, Take 20 mEq by mouth daily., Disp: 30 tablet, Rfl: 3 .  predniSONE (DELTASONE) 10 MG tablet, prednisone 10 mg tablet, Disp: , Rfl:  .  traMADol (ULTRAM) 50 MG tablet, Take 1 tablet (50 mg total) by mouth every 6 (six) hours as needed. (Patient not taking: Reported on 02/21/2019), Disp: 12 tablet, Rfl: 0 .  UNABLE TO FIND, nifedipine ER 30 mg tablet,extended release, Disp: , Rfl:  Allergies  Allergen Reactions  . Metoprolol Shortness Of Breath, Palpitations and Other (See Comments)    Heart starts racing. Shallow breathing; pt states he also get skin irritation on his legs  . Metformin Hives and Swelling    On legs  . Metformin And Related Nausea And Vomiting      Social History   Socioeconomic History  . Marital status: Married    Spouse name: Not on file  . Number of children: Not on file  . Years of education: Not on file  . Highest education level: Not on file  Occupational History  . 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 Financial resource strain: Not on file  . Food insecurity:    Worry: Not on file    Inability: Not on file  . Transportation needs:    Medical: Not on file    Non-medical: Not on file  Tobacco Use  . Smoking status: Former Smoker    Packs/day: 1.50    Years: 35.00    Pack years: 52.50    Types: Cigarettes  . Smokeless tobacco: Never Used  . Tobacco comment: quit atleast 25 yrs ago, per pt  Substance and Sexual Activity  . Alcohol use: Yes    Alcohol/week: 3.0 standard drinks    Types: 3 Shots of liquor per week     Comment: socially  . Drug use: No  . Sexual activity: Yes  Lifestyle  . Physical activity:    Days per week: Not on file    Minutes per session: Not on file  . Stress: Not on file  Relationships  . Social connections:    Talks on phone: Not on file    Gets together: Not on file    Attends religious service: Not on file    Active member of club or organization: Not on file    Attends meetings of clubs or organizations: Not on file    Relationship status: Not on file  . Intimate partner violence:    Fear of current or ex partner: Not on file    Emotionally abused: Not on file    Physically abused: Not on file    Forced sexual activity:  Not on file  Other Topics Concern  . Not on file  Social History Narrative   Pt lives in [place] with spouse.   Retired from [employer].  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    Physical Exam Cardiovascular:     Rate and Rhythm: Normal rate and regular rhythm.     Pulses: Normal pulses.  Pulmonary:     Effort: Pulmonary effort is normal.     Breath sounds: Normal breath sounds.  Abdominal:     General: There is no distension.     Palpations: Abdomen is soft.  Musculoskeletal: Normal range of motion.     Right lower leg: No edema.     Left lower leg: No edema.  Skin:    General: Skin is warm and dry.     Capillary Refill: Capillary refill takes less than 2 seconds.  Neurological:     Mental Status: He is oriented to person, place, and time.  Psychiatric:        Mood and Affect: Mood normal.         Future Appointments  Date Time Provider [place]  03/22/2019  1:40 PM Larey Dresser, MD [facility] None  05/22/2019  2:45 PM [place] Barefoot, DPM [facility]    BP 130/68 (BP Location: Right Arm, Patient Position: Sitting, Cuff Size: Large)   Pulse 74   Resp 16   Wt 192 lb 8 oz (87.3 kg)   SpO2 90%   BMI 30.15 kg/m   Weight yesterday- Did not weigh Last  visit weight- 191 lb  Mr Oscar was seen at home today and reported feeling generally well. He denied chest pain, SOB, headache, dizziness or orthopnea. He had missed two days worth of medications over the past week but reports his weight as stable. His medications were verified and his pillbox was refilled. I will follow up again next week.   [name], EMT 02/28/19  ACTION: Home visit completed Next visit planned for 1 week

## 2019-03-07 ENCOUNTER — Other Ambulatory Visit (HOSPITAL_COMMUNITY): Payer: Self-pay

## 2019-03-07 NOTE — Progress Notes (Signed)
Paramedicine Encounter    Patient ID: Andrew [name], male    DOB: [birth year], 85 y.o.   MRN: [id number]   Patient Care Team: [name], MD as PCP - General (Family Medicine) Larey Dresser, MD as Consulting Physician (Cardiology) Jorge Ny, LCSW as Social Worker (Licensed Clinical Social Worker)  Patient Active Problem List   Diagnosis Date Noted  . Chronic respiratory failure with hypoxia ([place]) 09/16/2018  . Bilateral recurrent inguinal hernias 11/28/2017  . Calf pain 05/25/2016  . Carotid artery stenosis 01/12/2016  . Chronic systolic CHF (congestive heart failure) ([place]) 12/02/2015  . Cerebellar infarct ([place]) 10/16/2015  . 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 ([place]) 10/16/2015  . PVD (peripheral vascular disease) ([place])   . Hereditary and idiopathic peripheral neuropathy 10/02/2015  . Peripelvic (lymphatic) cyst   . Pernicious anemia 07/15/2015  . Bilateral renal cysts 07/12/2015  . Lung nodule, 46mm RML CT 07/12/15 07/12/2015  . Constipation 07/12/2015  . Kidney stone on right side 07/12/2015  . Stage 3 chronic kidney disease ([place]) 06/14/2015  . Chronic diastolic CHF (congestive heart failure) ([place]) 05/28/2015  . Paroxysmal atrial fibrillation ([place]) 05/28/2015  . AAA (abdominal aortic aneurysm) without rupture ([place]) 09/30/2014  . Left-sided low back pain without sciatica 07/21/2014  . Cardiomyopathy, ischemic 05/23/2014  . DM (diabetes mellitus), type 2 with renal complications ([place]) 78/67/6720  . CORONARY ATHEROSCLEROSIS NATIVE CORONARY ARTERY 11/09/2010  . Depression, major, single episode, complete remission ([place]) 03/18/2010  . Hypothyroidism 02/03/2009  . Osteoarthritis 05/27/2008  . Hyperlipidemia associated with type 2 diabetes mellitus ([place]) 06/14/2007  . Hypertension associated with diabetes ([place]) 06/14/2007  . GERD 06/14/2007    Current Outpatient Medications:  .  amiodarone (PACERONE) 200 MG tablet, Take 0.5 tablets (100 mg total) by  mouth daily., Disp: 45 tablet, Rfl: 3 .  atorvastatin (LIPITOR) 80 MG tablet, Take 1 tablet (80 mg total) by mouth daily., Disp: 30 tablet, Rfl: 6 .  carvedilol (COREG) 12.5 MG tablet, TAKE 1 TABLET BY MOUTH TWICE DAILY., Disp: 180 tablet, Rfl: 3 .  cilostazol (PLETAL) 100 MG tablet, TAKE 1 TABLET BY MOUTH TWICE DAILY., Disp: 180 tablet, Rfl: 3 .  ELIQUIS 2.5 MG TABS tablet, TAKE 1 TABLET BY MOUTH TWICE DAILY., Disp: 180 tablet, Rfl: 0 .  ezetimibe (ZETIA) 10 MG tablet, TAKE (1) TABLET DAILY., Disp: 90 tablet, Rfl: 0 .  FLUoxetine (PROZAC) 20 MG capsule, TAKE (1) CAPSULE DAILY., Disp: 90 capsule, Rfl: 0 .  furosemide (LASIX) 80 MG tablet, Take 1 and 1/2 tablets (120 mg) in the morning and 1 tablet (80 mg) in the afternoon, Disp: 75 tablet, Rfl: 5 .  hydrALAZINE (APRESOLINE) 50 MG tablet, Take 1 tablet (50 mg total) by mouth 3 (three) times daily., Disp: 270 tablet, Rfl: 1 .  HYDROcodone-acetaminophen (NORCO/VICODIN) 5-325 MG tablet, hydrocodone 5 mg-acetaminophen 325 mg tablet, Disp: , Rfl:  .  isosorbide mononitrate (IMDUR) 30 MG 24 hr tablet, Take 1 tablet (30 mg total) by mouth daily., Disp: 30 tablet, Rfl: 6 .  levothyroxine (SYNTHROID, LEVOTHROID) 125 MCG tablet, Take 1 tablet (125 mcg total) by mouth daily before breakfast., Disp: 90 tablet, Rfl: 1 .  Multiple Vitamin (MULTIVITAMIN) tablet, Take 1 tablet by mouth daily., Disp: , Rfl:  .  nitroGLYCERIN (NITROSTAT) 0.4 MG SL tablet, DISSOLVE 1 TABLET UNDER TONGUE AS NEEDED FOR CHEST PAIN,MAY REPEAT IN5 MINUTES FOR 2 DOSES., Disp: 25 tablet, Rfl: 3 .  OXYCODONE HCL PO, oxycodone 5 mg tablet, Disp: ,  Rfl:  .  potassium chloride SA (K-DUR,KLOR-CON) 20 MEQ tablet, Take 1 tablet (20 mEq total) by mouth daily., Disp: 90 tablet, Rfl: 3 .  SPIRIVA HANDIHALER 18 MCG inhalation capsule, PLACE 1 CAPSULE INTO INHALER AND INHALE ONCE DAILY., Disp: 90 capsule, Rfl: 0 .  temazepam (RESTORIL) 30 MG capsule, TAKE 1 CAPSULE AT BEDTIME AS NEEDED FOR SLEEP., Disp:  30 capsule, Rfl: 0 .  glucose blood (ACCU-CHEK AVIVA PLUS) test strip, CHECK BLOOD SUGAR 4 TIMES A DAY., Disp: 100 each, Rfl: 11 .  Influenza Vac A&B Surf Ant Adj (FLUAD) 0.5 ML SUSY, Fluad 2019-20 40yr up(PF)45 mcg(15 mcgx3)/0.5 mL intramuscular syringe  ADM 0.5ML IM UTD, Disp: , Rfl:  .  Lancets MISC, Check blood sugar 4 times a day, Disp: 100 each, Rfl: 11 .  Potassium Chloride ER 20 MEQ TBCR, Take 20 mEq by mouth daily., Disp: 30 tablet, Rfl: 3 .  predniSONE (DELTASONE) 10 MG tablet, prednisone 10 mg tablet, Disp: , Rfl:  .  traMADol (ULTRAM) 50 MG tablet, Take 1 tablet (50 mg total) by mouth every 6 (six) hours as needed. (Patient not taking: Reported on 02/21/2019), Disp: 12 tablet, Rfl: 0 .  UNABLE TO FIND, nifedipine ER 30 mg tablet,extended release, Disp: , Rfl:  Allergies  Allergen Reactions  . Metoprolol Shortness Of Breath, Palpitations and Other (See Comments)    Heart starts racing. Shallow breathing; pt states he also get skin irritation on his legs  . Metformin Hives and Swelling    On legs  . Metformin And Related Nausea And Vomiting      Social History   Socioeconomic History  . Marital status: Married    Spouse name: Not on file  . Number of children: Not on file  . Years of education: Not on file  . Highest education level: Not on file  Occupational History  . 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 Financial resource strain: Not on file  . Food insecurity:    Worry: Not on file    Inability: Not on file  . Transportation needs:    Medical: Not on file    Non-medical: Not on file  Tobacco Use  . Smoking status: Former Smoker    Packs/day: 1.50    Years: 35.00    Pack years: 52.50    Types: Cigarettes  . Smokeless tobacco: Never Used  . Tobacco comment: quit atleast 25 yrs ago, per pt  Substance and Sexual Activity  . Alcohol use: Yes    Alcohol/week: 3.0 standard drinks    Types: 3 Shots of liquor per week    Comment: socially  . Drug use: No  .  Sexual activity: Yes  Lifestyle  . Physical activity:    Days per week: Not on file    Minutes per session: Not on file  . Stress: Not on file  Relationships  . Social connections:    Talks on phone: Not on file    Gets together: Not on file    Attends religious service: Not on file    Active member of club or organization: Not on file    Attends meetings of clubs or organizations: Not on file    Relationship status: Not on file  . Intimate partner violence:    Fear of current or ex partner: Not on file    Emotionally abused: Not on file    Physically abused: Not on file    Forced sexual activity: Not on file  Other Topics  Concern  . Not on file  Social History Narrative   Pt lives in [place] with spouse.   Retired from [employer].  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    Physical Exam Cardiovascular:     Rate and Rhythm: Normal rate and regular rhythm.     Pulses: Normal pulses.  Pulmonary:     Effort: Pulmonary effort is normal.     Breath sounds: Normal breath sounds.  Musculoskeletal: Normal range of motion.     Right lower leg: No edema.     Left lower leg: No edema.  Skin:    General: Skin is warm and dry.     Capillary Refill: Capillary refill takes less than 2 seconds.  Neurological:     Mental Status: He is alert and oriented to person, place, and time.  Psychiatric:        Mood and Affect: Mood normal.         Future Appointments  Date Time Provider [place]  03/22/2019  1:40 PM Larey Dresser, MD [facility] None  05/22/2019  2:45 PM Gardiner Barefoot, DPM [facility]    BP 118/60 (BP Location: Left Arm, Patient Position: Sitting, Cuff Size: Large)   Pulse 76   Resp 16   Wt 189 lb (85.7 kg)   SpO2 92%   BMI 29.60 kg/m   Weight yesterday- 188 lb Last visit weight- 193 lb  Mr Huynh was seen at home today and reported feeling well. He denied chest pain, SOB, headache, dizziness  or orthopnea. He stated he has been compliant with his medications over the past week and his weight has been stable. I did note while preparing to fill his pillbox that he had missed two days worth of medications last week and he said he would try to be better about it. His medications were verified and his pillbox was refilled.   [name], EMT 03/07/19  ACTION: Home visit completed Next visit planned for 1 week

## 2019-03-13 ENCOUNTER — Telehealth (HOSPITAL_COMMUNITY): Payer: Self-pay

## 2019-03-13 NOTE — Telephone Encounter (Signed)
Left vm regarding 3/27 appt to resched

## 2019-03-14 ENCOUNTER — Other Ambulatory Visit (HOSPITAL_COMMUNITY): Payer: Self-pay

## 2019-03-14 NOTE — Progress Notes (Signed)
Paramedicine Encounter    Patient ID: Andrew [name], male    DOB: [birth year], 85 y.o.   MRN: [id number]   Patient Care Team: [name], MD as PCP - General (Family Medicine) Larey Dresser, MD as Consulting Physician (Cardiology) Jorge Ny, LCSW as Social Worker (Licensed Clinical Social Worker)  Patient Active Problem List   Diagnosis Date Noted  . Chronic respiratory failure with hypoxia ([place]) 09/16/2018  . Bilateral recurrent inguinal hernias 11/28/2017  . Calf pain 05/25/2016  . Carotid artery stenosis 01/12/2016  . Chronic systolic CHF (congestive heart failure) ([place]) 12/02/2015  . Cerebellar infarct ([place]) 10/16/2015  . 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 ([place]) 10/16/2015  . PVD (peripheral vascular disease) ([place])   . Hereditary and idiopathic peripheral neuropathy 10/02/2015  . Peripelvic (lymphatic) cyst   . Pernicious anemia 07/15/2015  . Bilateral renal cysts 07/12/2015  . Lung nodule, 85mm RML CT 07/12/15 07/12/2015  . Constipation 07/12/2015  . Kidney stone on right side 07/12/2015  . Stage 3 chronic kidney disease ([place]) 06/14/2015  . Chronic diastolic CHF (congestive heart failure) ([place]) 05/28/2015  . Paroxysmal atrial fibrillation ([place]) 05/28/2015  . AAA (abdominal aortic aneurysm) without rupture ([place]) 09/30/2014  . Left-sided low back pain without sciatica 07/21/2014  . Cardiomyopathy, ischemic 05/23/2014  . DM (diabetes mellitus), type 2 with renal complications ([place]) 82/95/6213  . CORONARY ATHEROSCLEROSIS NATIVE CORONARY ARTERY 11/09/2010  . Depression, major, single episode, complete remission ([place]) 03/18/2010  . Hypothyroidism 02/03/2009  . Osteoarthritis 05/27/2008  . Hyperlipidemia associated with type 2 diabetes mellitus ([place]) 06/14/2007  . Hypertension associated with diabetes ([place]) 06/14/2007  . GERD 06/14/2007    Current Outpatient Medications:  .  amiodarone (PACERONE) 200 MG tablet, Take 0.5 tablets (100 mg total) by  mouth daily., Disp: 45 tablet, Rfl: 3 .  atorvastatin (LIPITOR) 80 MG tablet, Take 1 tablet (80 mg total) by mouth daily., Disp: 30 tablet, Rfl: 6 .  carvedilol (COREG) 12.5 MG tablet, TAKE 1 TABLET BY MOUTH TWICE DAILY., Disp: 180 tablet, Rfl: 3 .  cilostazol (PLETAL) 100 MG tablet, TAKE 1 TABLET BY MOUTH TWICE DAILY., Disp: 180 tablet, Rfl: 3 .  ELIQUIS 2.5 MG TABS tablet, TAKE 1 TABLET BY MOUTH TWICE DAILY., Disp: 180 tablet, Rfl: 0 .  ezetimibe (ZETIA) 10 MG tablet, TAKE (1) TABLET DAILY., Disp: 90 tablet, Rfl: 0 .  FLUoxetine (PROZAC) 20 MG capsule, TAKE (1) CAPSULE DAILY., Disp: 90 capsule, Rfl: 0 .  furosemide (LASIX) 80 MG tablet, Take 1 and 1/2 tablets (120 mg) in the morning and 1 tablet (80 mg) in the afternoon, Disp: 75 tablet, Rfl: 5 .  hydrALAZINE (APRESOLINE) 50 MG tablet, Take 1 tablet (50 mg total) by mouth 3 (three) times daily., Disp: 270 tablet, Rfl: 1 .  HYDROcodone-acetaminophen (NORCO/VICODIN) 5-325 MG tablet, hydrocodone 5 mg-acetaminophen 325 mg tablet, Disp: , Rfl:  .  isosorbide mononitrate (IMDUR) 30 MG 24 hr tablet, Take 1 tablet (30 mg total) by mouth daily., Disp: 30 tablet, Rfl: 6 .  levothyroxine (SYNTHROID, LEVOTHROID) 125 MCG tablet, Take 1 tablet (125 mcg total) by mouth daily before breakfast., Disp: 90 tablet, Rfl: 1 .  Multiple Vitamin (MULTIVITAMIN) tablet, Take 1 tablet by mouth daily., Disp: , Rfl:  .  potassium chloride SA (K-DUR,KLOR-CON) 20 MEQ tablet, Take 1 tablet (20 mEq total) by mouth daily., Disp: 90 tablet, Rfl: 3 .  SPIRIVA HANDIHALER 18 MCG inhalation capsule, PLACE 1 CAPSULE INTO INHALER AND INHALE ONCE DAILY.,  Disp: 90 capsule, Rfl: 0 .  temazepam (RESTORIL) 30 MG capsule, TAKE 1 CAPSULE AT BEDTIME AS NEEDED FOR SLEEP., Disp: 30 capsule, Rfl: 0 .  glucose blood (ACCU-CHEK AVIVA PLUS) test strip, CHECK BLOOD SUGAR 4 TIMES A DAY., Disp: 100 each, Rfl: 11 .  Influenza Vac A&B Surf Ant Adj (FLUAD) 0.5 ML SUSY, Fluad 2019-20 72yr up(PF)45 mcg(15  mcgx3)/0.5 mL intramuscular syringe  ADM 0.5ML IM UTD, Disp: , Rfl:  .  Lancets MISC, Check blood sugar 4 times a day, Disp: 100 each, Rfl: 11 .  nitroGLYCERIN (NITROSTAT) 0.4 MG SL tablet, DISSOLVE 1 TABLET UNDER TONGUE AS NEEDED FOR CHEST PAIN,MAY REPEAT IN5 MINUTES FOR 2 DOSES., Disp: 25 tablet, Rfl: 3 .  OXYCODONE HCL PO, oxycodone 5 mg tablet, Disp: , Rfl:  .  Potassium Chloride ER 20 MEQ TBCR, Take 20 mEq by mouth daily., Disp: 30 tablet, Rfl: 3 .  predniSONE (DELTASONE) 10 MG tablet, prednisone 10 mg tablet, Disp: , Rfl:  .  traMADol (ULTRAM) 50 MG tablet, Take 1 tablet (50 mg total) by mouth every 6 (six) hours as needed. (Patient not taking: Reported on 02/21/2019), Disp: 12 tablet, Rfl: 0 .  UNABLE TO FIND, nifedipine ER 30 mg tablet,extended release, Disp: , Rfl:  Allergies  Allergen Reactions  . Metoprolol Shortness Of Breath, Palpitations and Other (See Comments)    Heart starts racing. Shallow breathing; pt states he also get skin irritation on his legs  . Metformin Hives and Swelling    On legs  . Metformin And Related Nausea And Vomiting      Social History   Socioeconomic History  . Marital status: Married    Spouse name: Not on file  . Number of children: Not on file  . Years of education: Not on file  . Highest education level: Not on file  Occupational History  . 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 Financial resource strain: Not on file  . Food insecurity:    Worry: Not on file    Inability: Not on file  . Transportation needs:    Medical: Not on file    Non-medical: Not on file  Tobacco Use  . Smoking status: Former Smoker    Packs/day: 1.50    Years: 35.00    Pack years: 52.50    Types: Cigarettes  . Smokeless tobacco: Never Used  . Tobacco comment: quit atleast 25 yrs ago, per pt  Substance and Sexual Activity  . Alcohol use: Yes    Alcohol/week: 3.0 standard drinks    Types: 3 Shots of liquor per week    Comment: socially  . Drug use: No  .  Sexual activity: Yes  Lifestyle  . Physical activity:    Days per week: Not on file    Minutes per session: Not on file  . Stress: Not on file  Relationships  . Social connections:    Talks on phone: Not on file    Gets together: Not on file    Attends religious service: Not on file    Active member of club or organization: Not on file    Attends meetings of clubs or organizations: Not on file    Relationship status: Not on file  . Intimate partner violence:    Fear of current or ex partner: Not on file    Emotionally abused: Not on file    Physically abused: Not on file    Forced sexual activity: Not on file  Other Topics  Concern  . Not on file  Social History Narrative   Pt lives in [place] with spouse.   Retired from [employer].  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    Physical Exam Cardiovascular:     Rate and Rhythm: Normal rate and regular rhythm.     Pulses: Normal pulses.  Pulmonary:     Effort: Pulmonary effort is normal.     Breath sounds: Normal breath sounds.  Musculoskeletal: Normal range of motion.  Skin:    General: Skin is warm and dry.         Future Appointments  Date Time Provider [place]  03/22/2019  1:40 PM Larey Dresser, MD [facility] None  05/22/2019  2:45 PM Gardiner Barefoot, DPM [facility]    BP (!) 156/99 (BP Location: Left Arm, Patient Position: Sitting, Cuff Size: Large)   Pulse 71   Resp 16   Wt 190 lb (86.2 kg)   SpO2 96%   BMI 29.76 kg/m   Weight yesterday- Did not weigh Last visit weight- 189 lb  Mr Roker was seen at home today and reported feeling well. He denied chest pain, SOB, headache, dizziness or orthopnea. He had missed several days of his medications over the past week but his weight remains stable. I spoke to him about making sure he takes his medications and prescribed and he said he would do better. His medications were verified and his pillbox  was refilled. I will follow up next week.    [name], EMT 03/14/19  ACTION: Home visit completed Next visit planned for 1 week

## 2019-03-15 ENCOUNTER — Telehealth (HOSPITAL_COMMUNITY): Payer: Self-pay | Admitting: Licensed Clinical Social Worker

## 2019-03-15 NOTE — Telephone Encounter (Signed)
CSW reached out to pt to check in regarding food and medication status at this time.  No concerns reported at this time.  CSW encouraged pt to reach out with any concerns and will continue to follow and assist as needed  Jorge Ny, [place] Worker [facility] [phone]

## 2019-03-21 ENCOUNTER — Other Ambulatory Visit (HOSPITAL_COMMUNITY): Payer: Self-pay

## 2019-03-21 NOTE — Progress Notes (Signed)
Paramedicine Encounter    Patient ID: Andrew [name], male    DOB: 1928/01/04, 85 y.o.   MRN: [id number]   Patient Care Team: [name], MD as PCP - General (Family Medicine) Larey Dresser, MD as PCP - Advanced Heart Failure (Cardiology) Larey Dresser, MD as Consulting Physician (Cardiology) Jorge Ny, LCSW as Social Worker (Licensed Clinical Social Worker)  Patient Active Problem List   Diagnosis Date Noted  . Chronic respiratory failure with hypoxia ([place]) 09/16/2018  . Bilateral recurrent inguinal hernias 11/28/2017  . Calf pain 05/25/2016  . Carotid artery stenosis 01/12/2016  . Chronic systolic CHF (congestive heart failure) ([place]) 12/02/2015  . Cerebellar infarct ([place]) 10/16/2015  . 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 ([place]) 10/16/2015  . PVD (peripheral vascular disease) ([place])   . Hereditary and idiopathic peripheral neuropathy 10/02/2015  . Peripelvic (lymphatic) cyst   . Pernicious anemia 07/15/2015  . Bilateral renal cysts 07/12/2015  . Lung nodule, 57mm RML CT 07/12/15 07/12/2015  . Constipation 07/12/2015  . Kidney stone on right side 07/12/2015  . Stage 3 chronic kidney disease ([place]) 06/14/2015  . Chronic diastolic CHF (congestive heart failure) ([place]) 05/28/2015  . Paroxysmal atrial fibrillation ([place]) 05/28/2015  . AAA (abdominal aortic aneurysm) without rupture ([place]) 09/30/2014  . Left-sided low back pain without sciatica 07/21/2014  . Cardiomyopathy, ischemic 05/23/2014  . DM (diabetes mellitus), type 2 with renal complications ([place]) 27/78/2423  . CORONARY ATHEROSCLEROSIS NATIVE CORONARY ARTERY 11/09/2010  . Depression, major, single episode, complete remission ([place]) 03/18/2010  . Hypothyroidism 02/03/2009  . Osteoarthritis 05/27/2008  . Hyperlipidemia associated with type 2 diabetes mellitus ([place]) 06/14/2007  . Hypertension associated with diabetes ([place]) 06/14/2007  . GERD 06/14/2007    Current Outpatient Medications:  .   amiodarone (PACERONE) 200 MG tablet, Take 0.5 tablets (100 mg total) by mouth daily., Disp: 45 tablet, Rfl: 3 .  atorvastatin (LIPITOR) 80 MG tablet, Take 1 tablet (80 mg total) by mouth daily., Disp: 30 tablet, Rfl: 6 .  carvedilol (COREG) 12.5 MG tablet, TAKE 1 TABLET BY MOUTH TWICE DAILY., Disp: 180 tablet, Rfl: 3 .  cilostazol (PLETAL) 100 MG tablet, TAKE 1 TABLET BY MOUTH TWICE DAILY., Disp: 180 tablet, Rfl: 3 .  ELIQUIS 2.5 MG TABS tablet, TAKE 1 TABLET BY MOUTH TWICE DAILY., Disp: 180 tablet, Rfl: 0 .  ezetimibe (ZETIA) 10 MG tablet, TAKE (1) TABLET DAILY., Disp: 90 tablet, Rfl: 0 .  FLUoxetine (PROZAC) 20 MG capsule, TAKE (1) CAPSULE DAILY., Disp: 90 capsule, Rfl: 0 .  furosemide (LASIX) 80 MG tablet, Take 1 and 1/2 tablets (120 mg) in the morning and 1 tablet (80 mg) in the afternoon, Disp: 75 tablet, Rfl: 5 .  hydrALAZINE (APRESOLINE) 50 MG tablet, Take 1 tablet (50 mg total) by mouth 3 (three) times daily., Disp: 270 tablet, Rfl: 1 .  HYDROcodone-acetaminophen (NORCO/VICODIN) 5-325 MG tablet, hydrocodone 5 mg-acetaminophen 325 mg tablet, Disp: , Rfl:  .  isosorbide mononitrate (IMDUR) 30 MG 24 hr tablet, Take 1 tablet (30 mg total) by mouth daily., Disp: 30 tablet, Rfl: 6 .  levothyroxine (SYNTHROID, LEVOTHROID) 125 MCG tablet, Take 1 tablet (125 mcg total) by mouth daily before breakfast., Disp: 90 tablet, Rfl: 1 .  Multiple Vitamin (MULTIVITAMIN) tablet, Take 1 tablet by mouth daily., Disp: , Rfl:  .  nitroGLYCERIN (NITROSTAT) 0.4 MG SL tablet, DISSOLVE 1 TABLET UNDER TONGUE AS NEEDED FOR CHEST PAIN,MAY REPEAT IN5 MINUTES FOR 2 DOSES., Disp: 25 tablet, Rfl: 3 .  potassium chloride SA (K-DUR,KLOR-CON) 20 MEQ tablet, Take 1 tablet (20 mEq total) by mouth daily., Disp: 90 tablet, Rfl: 3 .  SPIRIVA HANDIHALER 18 MCG inhalation capsule, PLACE 1 CAPSULE INTO INHALER AND INHALE ONCE DAILY., Disp: 90 capsule, Rfl: 0 .  temazepam (RESTORIL) 30 MG capsule, TAKE 1 CAPSULE AT BEDTIME AS NEEDED FOR  SLEEP., Disp: 30 capsule, Rfl: 0 .  glucose blood (ACCU-CHEK AVIVA PLUS) test strip, CHECK BLOOD SUGAR 4 TIMES A DAY., Disp: 100 each, Rfl: 11 .  Influenza Vac A&B Surf Ant Adj (FLUAD) 0.5 ML SUSY, Fluad 2019-20 83yr up(PF)45 mcg(15 mcgx3)/0.5 mL intramuscular syringe  ADM 0.5ML IM UTD, Disp: , Rfl:  .  Lancets MISC, Check blood sugar 4 times a day, Disp: 100 each, Rfl: 11 .  OXYCODONE HCL PO, oxycodone 5 mg tablet, Disp: , Rfl:  .  Potassium Chloride ER 20 MEQ TBCR, Take 20 mEq by mouth daily., Disp: 30 tablet, Rfl: 3 .  predniSONE (DELTASONE) 10 MG tablet, prednisone 10 mg tablet, Disp: , Rfl:  .  traMADol (ULTRAM) 50 MG tablet, Take 1 tablet (50 mg total) by mouth every 6 (six) hours as needed. (Patient not taking: Reported on 02/21/2019), Disp: 12 tablet, Rfl: 0 .  UNABLE TO FIND, nifedipine ER 30 mg tablet,extended release, Disp: , Rfl:  Allergies  Allergen Reactions  . Metoprolol Shortness Of Breath, Palpitations and Other (See Comments)    Heart starts racing. Shallow breathing; pt states he also get skin irritation on his legs  . Metformin Hives and Swelling    On legs  . Metformin And Related Nausea And Vomiting      Social History   Socioeconomic History  . Marital status: Married    Spouse name: Not on file  . Number of children: Not on file  . Years of education: Not on file  . Highest education level: Not on file  Occupational History  . 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 Financial resource strain: Not on file  . Food insecurity:    Worry: Not on file    Inability: Not on file  . Transportation needs:    Medical: Not on file    Non-medical: Not on file  Tobacco Use  . Smoking status: Former Smoker    Packs/day: 1.50    Years: 35.00    Pack years: 52.50    Types: Cigarettes  . Smokeless tobacco: Never Used  . Tobacco comment: quit atleast 25 yrs ago, per pt  Substance and Sexual Activity  . Alcohol use: Yes    Alcohol/week: 3.0 standard drinks    Types: 3  Shots of liquor per week    Comment: socially  . Drug use: No  . Sexual activity: Yes  Lifestyle  . Physical activity:    Days per week: Not on file    Minutes per session: Not on file  . Stress: Not on file  Relationships  . Social connections:    Talks on phone: Not on file    Gets together: Not on file    Attends religious service: Not on file    Active member of club or organization: Not on file    Attends meetings of clubs or organizations: Not on file    Relationship status: Not on file  . Intimate partner violence:    Fear of current or ex partner: Not on file    Emotionally abused: Not on file    Physically abused: Not on file  Forced sexual activity: Not on file  Other Topics Concern  . Not on file  Social History Narrative   Pt lives in [place] with spouse.   Retired from [employer].  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    Physical Exam Cardiovascular:     Rate and Rhythm: Normal rate and regular rhythm.     Pulses: Normal pulses.  Pulmonary:     Effort: Pulmonary effort is normal.     Breath sounds: Normal breath sounds.  Musculoskeletal: Normal range of motion.     Right lower leg: No edema.     Left lower leg: No edema.  Skin:    General: Skin is warm and dry.     Capillary Refill: Capillary refill takes less than 2 seconds.  Neurological:     Mental Status: He is alert and oriented to person, place, and time.  Psychiatric:        Mood and Affect: Mood normal.         Future Appointments  Date Time Provider [place]  04/26/2019  3:00 PM Larey Dresser, MD [facility] None  05/22/2019  2:45 PM Gardiner Barefoot, DPM [facility]    BP (!) 151/75 (BP Location: Left Arm, Patient Position: Sitting, Cuff Size: Normal)   Pulse 74   Resp 18   Wt 189 lb (85.7 kg)   SpO2 90%   BMI 29.60 kg/m   Weight yesterday- did not weigh Last visit weight- 190 lb  Andrew [name] was seen at home  today and reported feeling well. He denied chest pain, SOB, headache, dizziness or orthopnea. He reported being compliant with his medications and his weight has been stable. His medications were verified and his pillbox was refilled.   [name], EMT 03/21/19  ACTION: Home visit completed Next visit planned for 1 week

## 2019-03-22 ENCOUNTER — Encounter (HOSPITAL_COMMUNITY): Payer: Medicare Other | Admitting: Cardiology

## 2019-03-28 ENCOUNTER — Other Ambulatory Visit (HOSPITAL_COMMUNITY): Payer: Self-pay

## 2019-03-28 NOTE — Progress Notes (Signed)
Paramedicine Encounter    Patient ID: Andrew [name], male    DOB: 1928/12/22, 85 y.o.   MRN: [id number]   Patient Care Team: [name], MD as PCP - General (Family Medicine) Larey Dresser, MD as PCP - Advanced Heart Failure (Cardiology) Larey Dresser, MD as Consulting Physician (Cardiology) Jorge Ny, LCSW as Social Worker (Licensed Clinical Social Worker)  Patient Active Problem List   Diagnosis Date Noted  . Chronic respiratory failure with hypoxia ([place]) 09/16/2018  . Bilateral recurrent inguinal hernias 11/28/2017  . Calf pain 05/25/2016  . Carotid artery stenosis 01/12/2016  . Chronic systolic CHF (congestive heart failure) ([place]) 12/02/2015  . Cerebellar infarct ([place]) 10/16/2015  . 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 ([place]) 10/16/2015  . PVD (peripheral vascular disease) ([place])   . Hereditary and idiopathic peripheral neuropathy 10/02/2015  . Peripelvic (lymphatic) cyst   . Pernicious anemia 07/15/2015  . Bilateral renal cysts 07/12/2015  . Lung nodule, 23mm RML CT 07/12/15 07/12/2015  . Constipation 07/12/2015  . Kidney stone on right side 07/12/2015  . Stage 3 chronic kidney disease ([place]) 06/14/2015  . Chronic diastolic CHF (congestive heart failure) ([place]) 05/28/2015  . Paroxysmal atrial fibrillation ([place]) 05/28/2015  . AAA (abdominal aortic aneurysm) without rupture ([place]) 09/30/2014  . Left-sided low back pain without sciatica 07/21/2014  . Cardiomyopathy, ischemic 05/23/2014  . DM (diabetes mellitus), type 2 with renal complications ([place]) 42/35/3614  . CORONARY ATHEROSCLEROSIS NATIVE CORONARY ARTERY 11/09/2010  . Depression, major, single episode, complete remission ([place]) 03/18/2010  . Hypothyroidism 02/03/2009  . Osteoarthritis 05/27/2008  . Hyperlipidemia associated with type 2 diabetes mellitus ([place]) 06/14/2007  . Hypertension associated with diabetes ([place]) 06/14/2007  . GERD 06/14/2007    Current Outpatient Medications:  .   amiodarone (PACERONE) 200 MG tablet, Take 0.5 tablets (100 mg total) by mouth daily., Disp: 45 tablet, Rfl: 3 .  atorvastatin (LIPITOR) 80 MG tablet, Take 1 tablet (80 mg total) by mouth daily., Disp: 30 tablet, Rfl: 6 .  carvedilol (COREG) 12.5 MG tablet, TAKE 1 TABLET BY MOUTH TWICE DAILY., Disp: 180 tablet, Rfl: 3 .  cilostazol (PLETAL) 100 MG tablet, TAKE 1 TABLET BY MOUTH TWICE DAILY., Disp: 180 tablet, Rfl: 3 .  ELIQUIS 2.5 MG TABS tablet, TAKE 1 TABLET BY MOUTH TWICE DAILY., Disp: 180 tablet, Rfl: 0 .  ezetimibe (ZETIA) 10 MG tablet, TAKE (1) TABLET DAILY., Disp: 90 tablet, Rfl: 0 .  FLUoxetine (PROZAC) 20 MG capsule, TAKE (1) CAPSULE DAILY., Disp: 90 capsule, Rfl: 0 .  furosemide (LASIX) 80 MG tablet, Take 1 and 1/2 tablets (120 mg) in the morning and 1 tablet (80 mg) in the afternoon, Disp: 75 tablet, Rfl: 5 .  glucose blood (ACCU-CHEK AVIVA PLUS) test strip, CHECK BLOOD SUGAR 4 TIMES A DAY., Disp: 100 each, Rfl: 11 .  hydrALAZINE (APRESOLINE) 50 MG tablet, Take 1 tablet (50 mg total) by mouth 3 (three) times daily., Disp: 270 tablet, Rfl: 1 .  HYDROcodone-acetaminophen (NORCO/VICODIN) 5-325 MG tablet, hydrocodone 5 mg-acetaminophen 325 mg tablet, Disp: , Rfl:  .  isosorbide mononitrate (IMDUR) 30 MG 24 hr tablet, Take 1 tablet (30 mg total) by mouth daily., Disp: 30 tablet, Rfl: 6 .  levothyroxine (SYNTHROID, LEVOTHROID) 125 MCG tablet, Take 1 tablet (125 mcg total) by mouth daily before breakfast., Disp: 90 tablet, Rfl: 1 .  Multiple Vitamin (MULTIVITAMIN) tablet, Take 1 tablet by mouth daily., Disp: , Rfl:  .  nitroGLYCERIN (NITROSTAT) 0.4 MG SL tablet,  DISSOLVE 1 TABLET UNDER TONGUE AS NEEDED FOR CHEST PAIN,MAY REPEAT IN5 MINUTES FOR 2 DOSES., Disp: 25 tablet, Rfl: 3 .  potassium chloride SA (K-DUR,KLOR-CON) 20 MEQ tablet, Take 1 tablet (20 mEq total) by mouth daily., Disp: 90 tablet, Rfl: 3 .  SPIRIVA HANDIHALER 18 MCG inhalation capsule, PLACE 1 CAPSULE INTO INHALER AND INHALE ONCE  DAILY., Disp: 90 capsule, Rfl: 0 .  temazepam (RESTORIL) 30 MG capsule, TAKE 1 CAPSULE AT BEDTIME AS NEEDED FOR SLEEP., Disp: 30 capsule, Rfl: 0 .  Influenza Vac A&B Surf Ant Adj (FLUAD) 0.5 ML SUSY, Fluad 2019-20 76yr up(PF)45 mcg(15 mcgx3)/0.5 mL intramuscular syringe  ADM 0.5ML IM UTD, Disp: , Rfl:  .  Lancets MISC, Check blood sugar 4 times a day, Disp: 100 each, Rfl: 11 .  OXYCODONE HCL PO, oxycodone 5 mg tablet, Disp: , Rfl:  .  Potassium Chloride ER 20 MEQ TBCR, Take 20 mEq by mouth daily., Disp: 30 tablet, Rfl: 3 .  predniSONE (DELTASONE) 10 MG tablet, prednisone 10 mg tablet, Disp: , Rfl:  .  traMADol (ULTRAM) 50 MG tablet, Take 1 tablet (50 mg total) by mouth every 6 (six) hours as needed. (Patient not taking: Reported on 02/21/2019), Disp: 12 tablet, Rfl: 0 .  UNABLE TO FIND, nifedipine ER 30 mg tablet,extended release, Disp: , Rfl:  Allergies  Allergen Reactions  . Metoprolol Shortness Of Breath, Palpitations and Other (See Comments)    Heart starts racing. Shallow breathing; pt states he also get skin irritation on his legs  . Metformin Hives and Swelling    On legs  . Metformin And Related Nausea And Vomiting      Social History   Socioeconomic History  . Marital status: Married    Spouse name: Not on file  . Number of children: Not on file  . Years of education: Not on file  . Highest education level: Not on file  Occupational History  . 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 Financial resource strain: Not on file  . Food insecurity:    Worry: Not on file    Inability: Not on file  . Transportation needs:    Medical: Not on file    Non-medical: Not on file  Tobacco Use  . Smoking status: Former Smoker    Packs/day: 1.50    Years: 35.00    Pack years: 52.50    Types: Cigarettes  . Smokeless tobacco: Never Used  . Tobacco comment: quit atleast 25 yrs ago, per pt  Substance and Sexual Activity  . Alcohol use: Yes    Alcohol/week: 3.0 standard drinks    Types:  3 Shots of liquor per week    Comment: socially  . Drug use: No  . Sexual activity: Yes  Lifestyle  . Physical activity:    Days per week: Not on file    Minutes per session: Not on file  . Stress: Not on file  Relationships  . Social connections:    Talks on phone: Not on file    Gets together: Not on file    Attends religious service: Not on file    Active member of club or organization: Not on file    Attends meetings of clubs or organizations: Not on file    Relationship status: Not on file  . Intimate partner violence:    Fear of current or ex partner: Not on file    Emotionally abused: Not on file    Physically abused: Not on file  Forced sexual activity: Not on file  Other Topics Concern  . Not on file  Social History Narrative   Pt lives in [place] with spouse.   Retired from [employer].  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    Physical Exam Cardiovascular:     Rate and Rhythm: Normal rate and regular rhythm.     Pulses: Normal pulses.  Pulmonary:     Effort: Pulmonary effort is normal.     Breath sounds: Normal breath sounds.  Musculoskeletal: Normal range of motion.     Right lower leg: No edema.     Left lower leg: No edema.  Skin:    General: Skin is warm and dry.     Capillary Refill: Capillary refill takes less than 2 seconds.  Neurological:     Mental Status: He is alert and oriented to person, place, and time.  Psychiatric:        Mood and Affect: Mood normal.         Future Appointments  Date Time Provider [place]  04/26/2019  3:00 PM Larey Dresser, MD [facility] None  05/22/2019  2:45 PM Gardiner Barefoot, DPM [facility]    BP 134/79 (BP Location: Left Arm, Patient Position: Sitting, Cuff Size: Normal)   Pulse 60   Resp 16   Wt 188 lb (85.3 kg)   SpO2 94%   BMI 29.44 kg/m   Weight yesterday- Did not weigh Last visit weight- 189 lb  Mr Bromwell was seen at home  today and reported feeling well. He denied chest pain, SOB, headache, dizziness, orthopnea, cough or fever. He stated he has been compliant with his medications over the past week and his weight has been stable. His medications were verified and his pillbox was refilled. I will follow up next week.   [name], EMT 03/28/19  ACTION: Home visit completed Next visit planned for 1 week

## 2019-04-04 ENCOUNTER — Other Ambulatory Visit (HOSPITAL_COMMUNITY): Payer: Self-pay

## 2019-04-04 NOTE — Progress Notes (Signed)
Paramedicine Encounter    Patient ID: Andrew [name], male    DOB: 02-Aug-1928, 85 y.o.   MRN: [id number]   Patient Care Team: [name], MD as PCP - General (Family Medicine) Larey Dresser, MD as PCP - Advanced Heart Failure (Cardiology) Larey Dresser, MD as Consulting Physician (Cardiology) Jorge Ny, LCSW as Social Worker (Licensed Clinical Social Worker)  Patient Active Problem List   Diagnosis Date Noted  . Chronic respiratory failure with hypoxia ([place]) 09/16/2018  . Bilateral recurrent inguinal hernias 11/28/2017  . Calf pain 05/25/2016  . Carotid artery stenosis 01/12/2016  . Chronic systolic CHF (congestive heart failure) ([place]) 12/02/2015  . Cerebellar infarct ([place]) 10/16/2015  . 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 ([place]) 10/16/2015  . PVD (peripheral vascular disease) ([place])   . Hereditary and idiopathic peripheral neuropathy 10/02/2015  . Peripelvic (lymphatic) cyst   . Pernicious anemia 07/15/2015  . Bilateral renal cysts 07/12/2015  . Lung nodule, 11mm RML CT 07/12/15 07/12/2015  . Constipation 07/12/2015  . Kidney stone on right side 07/12/2015  . Stage 3 chronic kidney disease ([place]) 06/14/2015  . Chronic diastolic CHF (congestive heart failure) ([place]) 05/28/2015  . Paroxysmal atrial fibrillation ([place]) 05/28/2015  . AAA (abdominal aortic aneurysm) without rupture ([place]) 09/30/2014  . Left-sided low back pain without sciatica 07/21/2014  . Cardiomyopathy, ischemic 05/23/2014  . DM (diabetes mellitus), type 2 with renal complications ([place]) 31/54/0086  . CORONARY ATHEROSCLEROSIS NATIVE CORONARY ARTERY 11/09/2010  . Depression, major, single episode, complete remission ([place]) 03/18/2010  . Hypothyroidism 02/03/2009  . Osteoarthritis 05/27/2008  . Hyperlipidemia associated with type 2 diabetes mellitus ([place]) 06/14/2007  . Hypertension associated with diabetes ([place]) 06/14/2007  . GERD 06/14/2007    Current Outpatient Medications:  .   amiodarone (PACERONE) 200 MG tablet, Take 0.5 tablets (100 mg total) by mouth daily., Disp: 45 tablet, Rfl: 3 .  atorvastatin (LIPITOR) 80 MG tablet, Take 1 tablet (80 mg total) by mouth daily., Disp: 30 tablet, Rfl: 6 .  carvedilol (COREG) 12.5 MG tablet, TAKE 1 TABLET BY MOUTH TWICE DAILY., Disp: 180 tablet, Rfl: 3 .  cilostazol (PLETAL) 100 MG tablet, TAKE 1 TABLET BY MOUTH TWICE DAILY., Disp: 180 tablet, Rfl: 3 .  ELIQUIS 2.5 MG TABS tablet, TAKE 1 TABLET BY MOUTH TWICE DAILY., Disp: 180 tablet, Rfl: 0 .  ezetimibe (ZETIA) 10 MG tablet, TAKE (1) TABLET DAILY., Disp: 90 tablet, Rfl: 0 .  FLUoxetine (PROZAC) 20 MG capsule, TAKE (1) CAPSULE DAILY., Disp: 90 capsule, Rfl: 0 .  furosemide (LASIX) 80 MG tablet, Take 1 and 1/2 tablets (120 mg) in the morning and 1 tablet (80 mg) in the afternoon, Disp: 75 tablet, Rfl: 5 .  hydrALAZINE (APRESOLINE) 50 MG tablet, Take 1 tablet (50 mg total) by mouth 3 (three) times daily., Disp: 270 tablet, Rfl: 1 .  HYDROcodone-acetaminophen (NORCO/VICODIN) 5-325 MG tablet, hydrocodone 5 mg-acetaminophen 325 mg tablet, Disp: , Rfl:  .  isosorbide mononitrate (IMDUR) 30 MG 24 hr tablet, Take 1 tablet (30 mg total) by mouth daily., Disp: 30 tablet, Rfl: 6 .  levothyroxine (SYNTHROID, LEVOTHROID) 125 MCG tablet, Take 1 tablet (125 mcg total) by mouth daily before breakfast., Disp: 90 tablet, Rfl: 1 .  Multiple Vitamin (MULTIVITAMIN) tablet, Take 1 tablet by mouth daily., Disp: , Rfl:  .  nitroGLYCERIN (NITROSTAT) 0.4 MG SL tablet, DISSOLVE 1 TABLET UNDER TONGUE AS NEEDED FOR CHEST PAIN,MAY REPEAT IN5 MINUTES FOR 2 DOSES., Disp: 25 tablet, Rfl: 3 .  potassium chloride SA (K-DUR,KLOR-CON) 20 MEQ tablet, Take 1 tablet (20 mEq total) by mouth daily., Disp: 90 tablet, Rfl: 3 .  SPIRIVA HANDIHALER 18 MCG inhalation capsule, PLACE 1 CAPSULE INTO INHALER AND INHALE ONCE DAILY., Disp: 90 capsule, Rfl: 0 .  temazepam (RESTORIL) 30 MG capsule, TAKE 1 CAPSULE AT BEDTIME AS NEEDED FOR  SLEEP., Disp: 30 capsule, Rfl: 0 .  glucose blood (ACCU-CHEK AVIVA PLUS) test strip, CHECK BLOOD SUGAR 4 TIMES A DAY., Disp: 100 each, Rfl: 11 .  Influenza Vac A&B Surf Ant Adj (FLUAD) 0.5 ML SUSY, , Disp: , Rfl:  .  Lancets MISC, Check blood sugar 4 times a day, Disp: 100 each, Rfl: 11 .  OXYCODONE HCL PO, oxycodone 5 mg tablet, Disp: , Rfl:  .  Potassium Chloride ER 20 MEQ TBCR, Take 20 mEq by mouth daily., Disp: 30 tablet, Rfl: 3 .  predniSONE (DELTASONE) 10 MG tablet, prednisone 10 mg tablet, Disp: , Rfl:  .  traMADol (ULTRAM) 50 MG tablet, Take 1 tablet (50 mg total) by mouth every 6 (six) hours as needed. (Patient not taking: Reported on 02/21/2019), Disp: 12 tablet, Rfl: 0 .  UNABLE TO FIND, nifedipine ER 30 mg tablet,extended release, Disp: , Rfl:  Allergies  Allergen Reactions  . Metoprolol Shortness Of Breath, Palpitations and Other (See Comments)    Heart starts racing. Shallow breathing; pt states he also get skin irritation on his legs  . Metformin Hives and Swelling    On legs  . Metformin And Related Nausea And Vomiting      Social History   Socioeconomic History  . Marital status: Married    Spouse name: Not on file  . Number of children: Not on file  . Years of education: Not on file  . Highest education level: Not on file  Occupational History  . 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 Financial resource strain: Not on file  . Food insecurity:    Worry: Not on file    Inability: Not on file  . Transportation needs:    Medical: Not on file    Non-medical: Not on file  Tobacco Use  . Smoking status: Former Smoker    Packs/day: 1.50    Years: 35.00    Pack years: 52.50    Types: Cigarettes  . Smokeless tobacco: Never Used  . Tobacco comment: quit atleast 25 yrs ago, per pt  Substance and Sexual Activity  . Alcohol use: Yes    Alcohol/week: 3.0 standard drinks    Types: 3 Shots of liquor per week    Comment: socially  . Drug use: No  . Sexual activity:  Yes  Lifestyle  . Physical activity:    Days per week: Not on file    Minutes per session: Not on file  . Stress: Not on file  Relationships  . Social connections:    Talks on phone: Not on file    Gets together: Not on file    Attends religious service: Not on file    Active member of club or organization: Not on file    Attends meetings of clubs or organizations: Not on file    Relationship status: Not on file  . Intimate partner violence:    Fear of current or ex partner: Not on file    Emotionally abused: Not on file    Physically abused: Not on file    Forced sexual activity: Not on file  Other Topics Concern  . Not  on file  Social History Narrative   Pt lives in [place] with spouse.   Retired from [employer].  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    Physical Exam Cardiovascular:     Rate and Rhythm: Normal rate and regular rhythm.     Pulses: Normal pulses.  Pulmonary:     Effort: Pulmonary effort is normal.     Breath sounds: Normal breath sounds.  Musculoskeletal: Normal range of motion.     Right lower leg: No edema.     Left lower leg: No edema.  Skin:    General: Skin is warm and dry.     Capillary Refill: Capillary refill takes less than 2 seconds.  Neurological:     Mental Status: He is alert and oriented to person, place, and time.  Psychiatric:        Mood and Affect: Mood normal.         Future Appointments  Date Time Provider [place]  04/26/2019  3:00 PM Larey Dresser, MD [facility] None  05/22/2019  2:45 PM Gardiner Barefoot, DPM [facility]    BP (!) 115/56 (BP Location: Right Arm, Patient Position: Sitting, Cuff Size: Normal)   Pulse 70   Resp 16   Wt 189 lb (85.7 kg)   SpO2 90%   BMI 29.60 kg/m   Weight yesterday- Did not weigh Last visit weight- 188 lb  Mr Stegmann was seen at home and he reported feeling generally well. He denied chest pain, SOB, headache,  dizziness, orthopnea, cough or fever. He stated he has been compliant with his medications over the past week and his weight has been stable. His medications were verified and his pillbox was refilled. I gave detailed instruction to his wife on how his medications are put in his pillbox so she would be prepared in the even that paramedicine visits are suspended.   [name], EMT 04/04/19  ACTION: Home visit completed Next visit planned for 1 week

## 2019-04-11 ENCOUNTER — Other Ambulatory Visit (HOSPITAL_COMMUNITY): Payer: Self-pay

## 2019-04-11 NOTE — Progress Notes (Signed)
Paramedicine Encounter    Patient ID: PATRICE MOATES, male    DOB: 1928-03-21, 85 y.o.   MRN: [id number]   Patient Care Team: [name], MD as PCP - General (Family Medicine) Larey Dresser, MD as PCP - Advanced Heart Failure (Cardiology) Larey Dresser, MD as Consulting Physician (Cardiology) Jorge Ny, LCSW as Social Worker (Licensed Clinical Social Worker)  Patient Active Problem List   Diagnosis Date Noted  . Chronic respiratory failure with hypoxia ([place]) 09/16/2018  . Bilateral recurrent inguinal hernias 11/28/2017  . Calf pain 05/25/2016  . Carotid artery stenosis 01/12/2016  . Chronic systolic CHF (congestive heart failure) ([place]) 12/02/2015  . Cerebellar infarct ([place]) 10/16/2015  . 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 ([place]) 10/16/2015  . PVD (peripheral vascular disease) ([place])   . Hereditary and idiopathic peripheral neuropathy 10/02/2015  . Peripelvic (lymphatic) cyst   . Pernicious anemia 07/15/2015  . Bilateral renal cysts 07/12/2015  . Lung nodule, 69mm RML CT 07/12/15 07/12/2015  . Constipation 07/12/2015  . Kidney stone on right side 07/12/2015  . Stage 3 chronic kidney disease ([place]) 06/14/2015  . Chronic diastolic CHF (congestive heart failure) ([place]) 05/28/2015  . Paroxysmal atrial fibrillation ([place]) 05/28/2015  . AAA (abdominal aortic aneurysm) without rupture ([place]) 09/30/2014  . Left-sided low back pain without sciatica 07/21/2014  . Cardiomyopathy, ischemic 05/23/2014  . DM (diabetes mellitus), type 2 with renal complications ([place]) 29/92/4268  . CORONARY ATHEROSCLEROSIS NATIVE CORONARY ARTERY 11/09/2010  . Depression, major, single episode, complete remission ([place]) 03/18/2010  . Hypothyroidism 02/03/2009  . Osteoarthritis 05/27/2008  . Hyperlipidemia associated with type 2 diabetes mellitus ([place]) 06/14/2007  . Hypertension associated with diabetes ([place]) 06/14/2007  . GERD 06/14/2007    Current Outpatient Medications:  .   amiodarone (PACERONE) 200 MG tablet, Take 0.5 tablets (100 mg total) by mouth daily., Disp: 45 tablet, Rfl: 3 .  atorvastatin (LIPITOR) 80 MG tablet, Take 1 tablet (80 mg total) by mouth daily., Disp: 30 tablet, Rfl: 6 .  carvedilol (COREG) 12.5 MG tablet, TAKE 1 TABLET BY MOUTH TWICE DAILY., Disp: 180 tablet, Rfl: 3 .  cilostazol (PLETAL) 100 MG tablet, TAKE 1 TABLET BY MOUTH TWICE DAILY., Disp: 180 tablet, Rfl: 3 .  ELIQUIS 2.5 MG TABS tablet, TAKE 1 TABLET BY MOUTH TWICE DAILY., Disp: 180 tablet, Rfl: 0 .  ezetimibe (ZETIA) 10 MG tablet, TAKE (1) TABLET DAILY., Disp: 90 tablet, Rfl: 0 .  FLUoxetine (PROZAC) 20 MG capsule, TAKE (1) CAPSULE DAILY., Disp: 90 capsule, Rfl: 0 .  furosemide (LASIX) 80 MG tablet, Take 1 and 1/2 tablets (120 mg) in the morning and 1 tablet (80 mg) in the afternoon, Disp: 75 tablet, Rfl: 5 .  glucose blood (ACCU-CHEK AVIVA PLUS) test strip, CHECK BLOOD SUGAR 4 TIMES A DAY., Disp: 100 each, Rfl: 11 .  hydrALAZINE (APRESOLINE) 50 MG tablet, Take 1 tablet (50 mg total) by mouth 3 (three) times daily., Disp: 270 tablet, Rfl: 1 .  HYDROcodone-acetaminophen (NORCO/VICODIN) 5-325 MG tablet, hydrocodone 5 mg-acetaminophen 325 mg tablet, Disp: , Rfl:  .  isosorbide mononitrate (IMDUR) 30 MG 24 hr tablet, Take 1 tablet (30 mg total) by mouth daily., Disp: 30 tablet, Rfl: 6 .  levothyroxine (SYNTHROID, LEVOTHROID) 125 MCG tablet, Take 1 tablet (125 mcg total) by mouth daily before breakfast., Disp: 90 tablet, Rfl: 1 .  Multiple Vitamin (MULTIVITAMIN) tablet, Take 1 tablet by mouth daily., Disp: , Rfl:  .  nitroGLYCERIN (NITROSTAT) 0.4 MG SL tablet,  DISSOLVE 1 TABLET UNDER TONGUE AS NEEDED FOR CHEST PAIN,MAY REPEAT IN5 MINUTES FOR 2 DOSES., Disp: 25 tablet, Rfl: 3 .  potassium chloride SA (K-DUR,KLOR-CON) 20 MEQ tablet, Take 1 tablet (20 mEq total) by mouth daily., Disp: 90 tablet, Rfl: 3 .  SPIRIVA HANDIHALER 18 MCG inhalation capsule, PLACE 1 CAPSULE INTO INHALER AND INHALE ONCE  DAILY., Disp: 90 capsule, Rfl: 0 .  temazepam (RESTORIL) 30 MG capsule, TAKE 1 CAPSULE AT BEDTIME AS NEEDED FOR SLEEP., Disp: 30 capsule, Rfl: 0 .  Influenza Vac A&B Surf Ant Adj (FLUAD) 0.5 ML SUSY, , Disp: , Rfl:  .  Lancets MISC, Check blood sugar 4 times a day, Disp: 100 each, Rfl: 11 .  OXYCODONE HCL PO, oxycodone 5 mg tablet, Disp: , Rfl:  .  Potassium Chloride ER 20 MEQ TBCR, Take 20 mEq by mouth daily., Disp: 30 tablet, Rfl: 3 .  predniSONE (DELTASONE) 10 MG tablet, prednisone 10 mg tablet, Disp: , Rfl:  .  traMADol (ULTRAM) 50 MG tablet, Take 1 tablet (50 mg total) by mouth every 6 (six) hours as needed. (Patient not taking: Reported on 02/21/2019), Disp: 12 tablet, Rfl: 0 .  UNABLE TO FIND, nifedipine ER 30 mg tablet,extended release, Disp: , Rfl:  Allergies  Allergen Reactions  . Metoprolol Shortness Of Breath, Palpitations and Other (See Comments)    Heart starts racing. Shallow breathing; pt states he also get skin irritation on his legs  . Metformin Hives and Swelling    On legs  . Metformin And Related Nausea And Vomiting      Social History   Socioeconomic History  . Marital status: Married    Spouse name: Not on file  . Number of children: Not on file  . Years of education: Not on file  . Highest education level: Not on file  Occupational History  . 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 Financial resource strain: Not on file  . Food insecurity:    Worry: Not on file    Inability: Not on file  . Transportation needs:    Medical: Not on file    Non-medical: Not on file  Tobacco Use  . Smoking status: Former Smoker    Packs/day: 1.50    Years: 35.00    Pack years: 52.50    Types: Cigarettes  . Smokeless tobacco: Never Used  . Tobacco comment: quit atleast 25 yrs ago, per pt  Substance and Sexual Activity  . Alcohol use: Yes    Alcohol/week: 3.0 standard drinks    Types: 3 Shots of liquor per week    Comment: socially  . Drug use: No  . Sexual activity:  Yes  Lifestyle  . Physical activity:    Days per week: Not on file    Minutes per session: Not on file  . Stress: Not on file  Relationships  . Social connections:    Talks on phone: Not on file    Gets together: Not on file    Attends religious service: Not on file    Active member of club or organization: Not on file    Attends meetings of clubs or organizations: Not on file    Relationship status: Not on file  . Intimate partner violence:    Fear of current or ex partner: Not on file    Emotionally abused: Not on file    Physically abused: Not on file    Forced sexual activity: Not on file  Other Topics Concern  .  Not on file  Social History Narrative   Pt lives in [place] with spouse.   Retired from [employer].  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    Physical Exam Cardiovascular:     Rate and Rhythm: Normal rate and regular rhythm.     Pulses: Normal pulses.  Pulmonary:     Effort: Pulmonary effort is normal.     Breath sounds: Normal breath sounds.  Musculoskeletal: Normal range of motion.     Right lower leg: No edema.     Left lower leg: No edema.  Skin:    General: Skin is warm and dry.     Capillary Refill: Capillary refill takes less than 2 seconds.  Neurological:     Mental Status: He is alert and oriented to person, place, and time.  Psychiatric:        Mood and Affect: Mood normal.         Future Appointments  Date Time Provider [place]  04/26/2019  3:00 PM [place] None  05/22/2019  2:45 PM Gardiner Barefoot, DPM [facility]    BP (!) 118/57 (BP Location: Left Arm, Patient Position: Sitting, Cuff Size: Normal)   Pulse 80   Resp 16   Wt 189 lb (85.7 kg)   SpO2 93%   BMI 29.60 kg/m   Weight yesterday- 189 lb Last visit weight- 189 lb  Mr Kiporpes was seen at home today and reported feeling well. He denied chest pain, SOB, headache, dizziness, orthopnea, cough  or fever since our last visit. He stated he has been compliant with his medications and his weight remains stable. His medications were verified and  His pillbox was refilled.   [name], EMT 04/11/19  ACTION: Home visit completed Next visit planned for 1 week

## 2019-04-18 ENCOUNTER — Other Ambulatory Visit (HOSPITAL_COMMUNITY): Payer: Self-pay

## 2019-04-18 NOTE — Progress Notes (Signed)
Paramedicine Encounter    Patient ID: Andrew [name], male    DOB: 1928/07/23, 85 y.o.   MRN: [id number]   Patient Care Team: [name], MD as PCP - General (Family Medicine) Larey Dresser, MD as PCP - Advanced Heart Failure (Cardiology) Larey Dresser, MD as Consulting Physician (Cardiology) Jorge Ny, LCSW as Social Worker (Licensed Clinical Social Worker)  Patient Active Problem List   Diagnosis Date Noted  . Chronic respiratory failure with hypoxia ([place]) 09/16/2018  . Bilateral recurrent inguinal hernias 11/28/2017  . Calf pain 05/25/2016  . Carotid artery stenosis 01/12/2016  . Chronic systolic CHF (congestive heart failure) ([place]) 12/02/2015  . Cerebellar infarct ([place]) 10/16/2015  . 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 ([place]) 10/16/2015  . PVD (peripheral vascular disease) ([place])   . Hereditary and idiopathic peripheral neuropathy 10/02/2015  . Peripelvic (lymphatic) cyst   . Pernicious anemia 07/15/2015  . Bilateral renal cysts 07/12/2015  . Lung nodule, 23mm RML CT 07/12/15 07/12/2015  . Constipation 07/12/2015  . Kidney stone on right side 07/12/2015  . Stage 3 chronic kidney disease ([place]) 06/14/2015  . Chronic diastolic CHF (congestive heart failure) ([place]) 05/28/2015  . Paroxysmal atrial fibrillation ([place]) 05/28/2015  . AAA (abdominal aortic aneurysm) without rupture ([place]) 09/30/2014  . Left-sided low back pain without sciatica 07/21/2014  . Cardiomyopathy, ischemic 05/23/2014  . DM (diabetes mellitus), type 2 with renal complications ([place]) 33/82/5053  . CORONARY ATHEROSCLEROSIS NATIVE CORONARY ARTERY 11/09/2010  . Depression, major, single episode, complete remission ([place]) 03/18/2010  . Hypothyroidism 02/03/2009  . Osteoarthritis 05/27/2008  . Hyperlipidemia associated with type 2 diabetes mellitus ([place]) 06/14/2007  . Hypertension associated with diabetes ([place]) 06/14/2007  . GERD 06/14/2007    Current Outpatient Medications:  .   amiodarone (PACERONE) 200 MG tablet, Take 0.5 tablets (100 mg total) by mouth daily., Disp: 45 tablet, Rfl: 3 .  atorvastatin (LIPITOR) 80 MG tablet, Take 1 tablet (80 mg total) by mouth daily., Disp: 30 tablet, Rfl: 6 .  carvedilol (COREG) 12.5 MG tablet, TAKE 1 TABLET BY MOUTH TWICE DAILY., Disp: 180 tablet, Rfl: 3 .  cilostazol (PLETAL) 100 MG tablet, TAKE 1 TABLET BY MOUTH TWICE DAILY., Disp: 180 tablet, Rfl: 3 .  ELIQUIS 2.5 MG TABS tablet, TAKE 1 TABLET BY MOUTH TWICE DAILY., Disp: 180 tablet, Rfl: 0 .  ezetimibe (ZETIA) 10 MG tablet, TAKE (1) TABLET DAILY., Disp: 90 tablet, Rfl: 0 .  FLUoxetine (PROZAC) 20 MG capsule, TAKE (1) CAPSULE DAILY., Disp: 90 capsule, Rfl: 0 .  furosemide (LASIX) 80 MG tablet, Take 1 and 1/2 tablets (120 mg) in the morning and 1 tablet (80 mg) in the afternoon, Disp: 75 tablet, Rfl: 5 .  hydrALAZINE (APRESOLINE) 50 MG tablet, Take 1 tablet (50 mg total) by mouth 3 (three) times daily., Disp: 270 tablet, Rfl: 1 .  HYDROcodone-acetaminophen (NORCO/VICODIN) 5-325 MG tablet, hydrocodone 5 mg-acetaminophen 325 mg tablet, Disp: , Rfl:  .  isosorbide mononitrate (IMDUR) 30 MG 24 hr tablet, Take 1 tablet (30 mg total) by mouth daily., Disp: 30 tablet, Rfl: 6 .  levothyroxine (SYNTHROID, LEVOTHROID) 125 MCG tablet, Take 1 tablet (125 mcg total) by mouth daily before breakfast., Disp: 90 tablet, Rfl: 1 .  Multiple Vitamin (MULTIVITAMIN) tablet, Take 1 tablet by mouth daily., Disp: , Rfl:  .  nitroGLYCERIN (NITROSTAT) 0.4 MG SL tablet, DISSOLVE 1 TABLET UNDER TONGUE AS NEEDED FOR CHEST PAIN,MAY REPEAT IN5 MINUTES FOR 2 DOSES., Disp: 25 tablet, Rfl: 3 .  potassium chloride SA (K-DUR,KLOR-CON) 20 MEQ tablet, Take 1 tablet (20 mEq total) by mouth daily., Disp: 90 tablet, Rfl: 3 .  SPIRIVA HANDIHALER 18 MCG inhalation capsule, PLACE 1 CAPSULE INTO INHALER AND INHALE ONCE DAILY., Disp: 90 capsule, Rfl: 0 .  temazepam (RESTORIL) 30 MG capsule, TAKE 1 CAPSULE AT BEDTIME AS NEEDED FOR  SLEEP., Disp: 30 capsule, Rfl: 0 .  glucose blood (ACCU-CHEK AVIVA PLUS) test strip, CHECK BLOOD SUGAR 4 TIMES A DAY., Disp: 100 each, Rfl: 11 .  Influenza Vac A&B Surf Ant Adj (FLUAD) 0.5 ML SUSY, , Disp: , Rfl:  .  Lancets MISC, Check blood sugar 4 times a day, Disp: 100 each, Rfl: 11 .  OXYCODONE HCL PO, oxycodone 5 mg tablet, Disp: , Rfl:  .  Potassium Chloride ER 20 MEQ TBCR, Take 20 mEq by mouth daily., Disp: 30 tablet, Rfl: 3 .  predniSONE (DELTASONE) 10 MG tablet, prednisone 10 mg tablet, Disp: , Rfl:  .  traMADol (ULTRAM) 50 MG tablet, Take 1 tablet (50 mg total) by mouth every 6 (six) hours as needed. (Patient not taking: Reported on 02/21/2019), Disp: 12 tablet, Rfl: 0 .  UNABLE TO FIND, nifedipine ER 30 mg tablet,extended release, Disp: , Rfl:  Allergies  Allergen Reactions  . Metoprolol Shortness Of Breath, Palpitations and Other (See Comments)    Heart starts racing. Shallow breathing; pt states he also get skin irritation on his legs  . Metformin Hives and Swelling    On legs  . Metformin And Related Nausea And Vomiting      Social History   Socioeconomic History  . Marital status: Married    Spouse name: Not on file  . Number of children: Not on file  . Years of education: Not on file  . Highest education level: Not on file  Occupational History  . 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 Financial resource strain: Not on file  . Food insecurity:    Worry: Not on file    Inability: Not on file  . Transportation needs:    Medical: Not on file    Non-medical: Not on file  Tobacco Use  . Smoking status: Former Smoker    Packs/day: 1.50    Years: 35.00    Pack years: 52.50    Types: Cigarettes  . Smokeless tobacco: Never Used  . Tobacco comment: quit atleast 25 yrs ago, per pt  Substance and Sexual Activity  . Alcohol use: Yes    Alcohol/week: 3.0 standard drinks    Types: 3 Shots of liquor per week    Comment: socially  . Drug use: No  . Sexual activity:  Yes  Lifestyle  . Physical activity:    Days per week: Not on file    Minutes per session: Not on file  . Stress: Not on file  Relationships  . Social connections:    Talks on phone: Not on file    Gets together: Not on file    Attends religious service: Not on file    Active member of club or organization: Not on file    Attends meetings of clubs or organizations: Not on file    Relationship status: Not on file  . Intimate partner violence:    Fear of current or ex partner: Not on file    Emotionally abused: Not on file    Physically abused: Not on file    Forced sexual activity: Not on file  Other Topics Concern  . Not  on file  Social History Narrative   Pt lives in [place] with spouse.   Retired from [employer].  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    Physical Exam Cardiovascular:     Rate and Rhythm: Normal rate and regular rhythm.     Pulses: Normal pulses.  Pulmonary:     Effort: Pulmonary effort is normal.  Musculoskeletal: Normal range of motion.     Right lower leg: No edema.     Left lower leg: No edema.  Skin:    General: Skin is warm and dry.     Capillary Refill: Capillary refill takes less than 2 seconds.  Neurological:     Mental Status: He is alert and oriented to person, place, and time.  Psychiatric:        Mood and Affect: Mood normal.         Future Appointments  Date Time Provider [place]  04/26/2019  3:00 PM Larey Dresser, MD [facility] None  05/22/2019  2:45 PM Gardiner Barefoot, DPM [facility]    BP (!) 137/57 (BP Location: Left Arm, Patient Position: Sitting, Cuff Size: Normal)   Pulse 72   Resp 16   Wt 189 lb (85.7 kg)   SpO2 94%   BMI 29.60 kg/m   Weight yesterday- 189 lb Last visit weight- 189 lb  Mr Renstrom was seen at home today and reported feeling generally well. He denied chest pain, SOB, headache, dizziness, orthopnea, cough or fever since our last  visit. He stated he has been compliant with his medications and his weight has been overall stable. Upon filling his pillbox, I noted 3 missed slots of medications last week though the majority of the medications had been taken. His medications were verified and his pillbox was refilled. I will follow up next week.   [name], EMT 04/18/19  ACTION: Home visit completed Next visit planned for 1 week

## 2019-04-20 ENCOUNTER — Other Ambulatory Visit (HOSPITAL_COMMUNITY): Payer: Self-pay | Admitting: Cardiology

## 2019-04-20 ENCOUNTER — Other Ambulatory Visit: Payer: Self-pay | Admitting: Family Medicine

## 2019-04-20 ENCOUNTER — Other Ambulatory Visit: Payer: Self-pay | Admitting: Internal Medicine

## 2019-04-26 ENCOUNTER — Other Ambulatory Visit: Payer: Self-pay

## 2019-04-26 ENCOUNTER — Ambulatory Visit (HOSPITAL_COMMUNITY)
Admission: RE | Admit: 2019-04-26 | Discharge: 2019-04-26 | Disposition: A | Discharge status: Home / Self Care | Payer: Medicare Other | Source: Ambulatory Visit | Attending: Cardiology | Admitting: Cardiology

## 2019-04-26 ENCOUNTER — Encounter (HOSPITAL_COMMUNITY): Payer: Medicare Other

## 2019-04-26 ENCOUNTER — Other Ambulatory Visit (HOSPITAL_COMMUNITY): Payer: Self-pay

## 2019-04-26 ENCOUNTER — Telehealth (HOSPITAL_COMMUNITY): Payer: Self-pay

## 2019-04-26 DIAGNOSIS — I5032 Chronic diastolic (congestive) heart failure: Secondary | ICD-10-CM

## 2019-04-26 DIAGNOSIS — I2510 Atherosclerotic heart disease of native coronary artery without angina pectoris: Secondary | ICD-10-CM | POA: Diagnosis not present

## 2019-04-26 NOTE — Telephone Encounter (Signed)
AVS reviewed scheduled lab with wife on 5 may @1030 

## 2019-04-26 NOTE — Progress Notes (Signed)
Paramedicine Encounter    Patient ID: Andrew [name], male    DOB: 06-23-1928, 85 y.o.   MRN: [id number]   Patient Care Team: [name], MD as PCP - General (Family Medicine) Larey Dresser, MD as PCP - Advanced Heart Failure (Cardiology) Larey Dresser, MD as Consulting Physician (Cardiology) Jorge Ny, LCSW as Social Worker (Licensed Clinical Social Worker)  Patient Active Problem List   Diagnosis Date Noted  . Chronic respiratory failure with hypoxia ([place]) 09/16/2018  . Bilateral recurrent inguinal hernias 11/28/2017  . Calf pain 05/25/2016  . Carotid artery stenosis 01/12/2016  . Chronic systolic CHF (congestive heart failure) ([place]) 12/02/2015  . Cerebellar infarct ([place]) 10/16/2015  . 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 ([place]) 10/16/2015  . PVD (peripheral vascular disease) ([place])   . Hereditary and idiopathic peripheral neuropathy 10/02/2015  . Peripelvic (lymphatic) cyst   . Pernicious anemia 07/15/2015  . Bilateral renal cysts 07/12/2015  . Lung nodule, 38mm RML CT 07/12/15 07/12/2015  . Constipation 07/12/2015  . Kidney stone on right side 07/12/2015  . Stage 3 chronic kidney disease ([place]) 06/14/2015  . Chronic diastolic CHF (congestive heart failure) ([place]) 05/28/2015  . Paroxysmal atrial fibrillation ([place]) 05/28/2015  . AAA (abdominal aortic aneurysm) without rupture ([place]) 09/30/2014  . Left-sided low back pain without sciatica 07/21/2014  . Cardiomyopathy, ischemic 05/23/2014  . DM (diabetes mellitus), type 2 with renal complications ([place]) 69/62/9528  . CORONARY ATHEROSCLEROSIS NATIVE CORONARY ARTERY 11/09/2010  . Depression, major, single episode, complete remission ([place]) 03/18/2010  . Hypothyroidism 02/03/2009  . Osteoarthritis 05/27/2008  . Hyperlipidemia associated with type 2 diabetes mellitus ([place]) 06/14/2007  . Hypertension associated with diabetes ([place]) 06/14/2007  . GERD 06/14/2007    Current Outpatient Medications:  .   amiodarone (PACERONE) 200 MG tablet, Take 0.5 tablets (100 mg total) by mouth daily., Disp: 45 tablet, Rfl: 3 .  atorvastatin (LIPITOR) 80 MG tablet, Take 1 tablet (80 mg total) by mouth daily., Disp: 30 tablet, Rfl: 6 .  carvedilol (COREG) 12.5 MG tablet, TAKE 1 TABLET BY MOUTH TWICE DAILY., Disp: 180 tablet, Rfl: 3 .  cilostazol (PLETAL) 100 MG tablet, TAKE 1 TABLET BY MOUTH TWICE DAILY., Disp: 180 tablet, Rfl: 0 .  ELIQUIS 2.5 MG TABS tablet, TAKE 1 TABLET BY MOUTH TWICE DAILY., Disp: 180 tablet, Rfl: 0 .  ezetimibe (ZETIA) 10 MG tablet, TAKE (1) TABLET DAILY., Disp: 90 tablet, Rfl: 0 .  FLUoxetine (PROZAC) 20 MG capsule, TAKE (1) CAPSULE DAILY., Disp: 90 capsule, Rfl: 0 .  furosemide (LASIX) 80 MG tablet, TAKE 1&1/2 TABLETS IN THE MORNING AND 1 TABLET IN THE AFTERNOON., Disp: 225 tablet, Rfl: 0 .  glucose blood (ACCU-CHEK AVIVA PLUS) test strip, CHECK BLOOD SUGAR 4 TIMES A DAY., Disp: 100 each, Rfl: 11 .  hydrALAZINE (APRESOLINE) 50 MG tablet, Take 1 tablet (50 mg total) by mouth 3 (three) times daily., Disp: 270 tablet, Rfl: 1 .  HYDROcodone-acetaminophen (NORCO/VICODIN) 5-325 MG tablet, hydrocodone 5 mg-acetaminophen 325 mg tablet, Disp: , Rfl:  .  Influenza Vac A&B Surf Ant Adj (FLUAD) 0.5 ML SUSY, , Disp: , Rfl:  .  isosorbide mononitrate (IMDUR) 30 MG 24 hr tablet, TAKE 1 TABLET EACH DAY., Disp: 90 tablet, Rfl: 0 .  Lancets MISC, Check blood sugar 4 times a day, Disp: 100 each, Rfl: 11 .  levothyroxine (SYNTHROID, LEVOTHROID) 125 MCG tablet, Take 1 tablet (125 mcg total) by mouth daily before breakfast., Disp: 90 tablet, Rfl: 1 .  Multiple Vitamin (MULTIVITAMIN) tablet, Take 1 tablet by mouth daily., Disp: , Rfl:  .  nitroGLYCERIN (NITROSTAT) 0.4 MG SL tablet, DISSOLVE 1 TABLET UNDER TONGUE AS NEEDED FOR CHEST PAIN,MAY REPEAT IN5 MINUTES FOR 2 DOSES., Disp: 25 tablet, Rfl: 3 .  OXYCODONE HCL PO, oxycodone 5 mg tablet, Disp: , Rfl:  .  Potassium Chloride ER 20 MEQ TBCR, Take 20 mEq by mouth  daily., Disp: 30 tablet, Rfl: 3 .  potassium chloride SA (K-DUR) 20 MEQ tablet, TAKE 1 TABLET ONCE DAILY., Disp: 90 tablet, Rfl: 0 .  predniSONE (DELTASONE) 10 MG tablet, prednisone 10 mg tablet, Disp: , Rfl:  .  SPIRIVA HANDIHALER 18 MCG inhalation capsule, PLACE 1 CAPSULE INTO INHALER AND INHALE ONCE DAILY., Disp: 90 capsule, Rfl: 0 .  temazepam (RESTORIL) 30 MG capsule, TAKE 1 CAPSULE AT BEDTIME AS NEEDED FOR SLEEP., Disp: 30 capsule, Rfl: 0 .  traMADol (ULTRAM) 50 MG tablet, Take 1 tablet (50 mg total) by mouth every 6 (six) hours as needed. (Patient not taking: Reported on 02/21/2019), Disp: 12 tablet, Rfl: 0 .  UNABLE TO FIND, nifedipine ER 30 mg tablet,extended release, Disp: , Rfl:  Allergies  Allergen Reactions  . Metoprolol Shortness Of Breath, Palpitations and Other (See Comments)    Heart starts racing. Shallow breathing; pt states he also get skin irritation on his legs  . Metformin Hives and Swelling    On legs  . Metformin And Related Nausea And Vomiting      Social History   Socioeconomic History  . Marital status: Married    Spouse name: Not on file  . Number of children: Not on file  . Years of education: Not on file  . Highest education level: Not on file  Occupational History  . 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 Financial resource strain: Not on file  . Food insecurity:    Worry: Not on file    Inability: Not on file  . Transportation needs:    Medical: Not on file    Non-medical: Not on file  Tobacco Use  . Smoking status: Former Smoker    Packs/day: 1.50    Years: 35.00    Pack years: 52.50    Types: Cigarettes  . Smokeless tobacco: Never Used  . Tobacco comment: quit atleast 25 yrs ago, per pt  Substance and Sexual Activity  . Alcohol use: Yes    Alcohol/week: 3.0 standard drinks    Types: 3 Shots of liquor per week    Comment: socially  . Drug use: No  . Sexual activity: Yes  Lifestyle  . Physical activity:    Days per week: Not on file     Minutes per session: Not on file  . Stress: Not on file  Relationships  . Social connections:    Talks on phone: Not on file    Gets together: Not on file    Attends religious service: Not on file    Active member of club or organization: Not on file    Attends meetings of clubs or organizations: Not on file    Relationship status: Not on file  . Intimate partner violence:    Fear of current or ex partner: Not on file    Emotionally abused: Not on file    Physically abused: Not on file    Forced sexual activity: Not on file  Other Topics Concern  . Not on file  Social History Narrative   Pt lives in [place] with  spouse.   Retired from [employer].  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    Physical Exam Cardiovascular:     Rate and Rhythm: Normal rate and regular rhythm.     Pulses: Normal pulses.  Pulmonary:     Effort: Pulmonary effort is normal.     Breath sounds: Normal breath sounds.  Musculoskeletal: Normal range of motion.     Right lower leg: No edema.     Left lower leg: No edema.  Skin:    General: Skin is warm and dry.  Neurological:     Mental Status: He is alert and oriented to person, place, and time.  Psychiatric:        Mood and Affect: Mood normal.         Future Appointments  Date Time Provider [place]  05/22/2019  2:45 PM Gardiner Barefoot, DPM [facility]    BP (!) 160/82 (BP Location: Left Arm, Patient Position: Sitting, Cuff Size: Normal)   Pulse 66   Resp 16   Wt 188 lb (85.3 kg)   SpO2 94%   BMI 29.44 kg/m   Weight yesterday- 188 lb Last visit weight- 189 lb  Andrew [name] was seen at home in conjunction with Dr [name] today and reported feeling well. He denied chest pain, SOB, headache, dizziness, orthopnea, cough or fever since our last visit. Andrew [name] reported being compliant with his medications but admitted to missing a couple of slots including today. This  missed dosing was reflected in his blood pressure today but since he has been generally stable, Dr [name] did not see the need for a medication change at this time. His medications were verified and his pillbox was refilled.   [name], EMT 04/26/19  ACTION: Home visit completed Next visit planned for 1 week

## 2019-04-28 NOTE — Progress Notes (Signed)
Heart Failure TeleHealth Note  Due to national recommendations of social distancing due to [place] 19, Audio/video telehealth visit is felt to be most appropriate for this patient at this time.  See MyChart message from today for patient consent regarding telehealth for [facility].  Date:  04/28/2019   ID:  Andrew [name], DOB [birth year], MRN [id number]  Location: Home  Provider location: [place] [facility] Type of Visit: Established patient   PCP:  [name], MD  Cardiologist:  Dr. [name]  Chief Complaint: Shortness of breath   History of Present Illness: Andrew [name] is a 85 y.o. male who presents via audio/video conferencing for a telehealth visit today.     he denies symptoms worrisome for COVID 19.   Patient has a history of CKD, CAD s/p CABG, and ischemic cardiomyopathy with primarily diastolic CHF.  He has PAD followed at [facility].  I took him for [facility] in 11/15. 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  He had had occlusion of a relatively small LAD, there were right to left collaterals and no intervention was done (medical management).    He and his wife went on a cruise along the [employer] in 5/16.  He admits to considerable dietary indiscretion (high sodium diet). It appears that he developed a hypertensive crisis along with chest pain while on the ship. He went into atrial fibrillation and developed CHF.  He was taken to the hospital in [place], [place].  He was in the ICU for about a week on Bipap intermittently.  Troponin peaked at 0.09 during this admission.  He was diuresed and after about 2 wks left the hospital and was able to fly home.    Cardiolite (6/16) showed EF 42%, prior anterolateral MI, no ischemia. Echo in 10/16 with EF 40-45%.    He was admitted on 07/06/15 for RLQ pain and fever.  He was treated for UTI and had a kidney stone as well.  He developed SOB after IVF were given for AKI.  He was treated for acute COPD exacerbation as  well as CHF decompensation.  He was diuresed with IV lasix.  His weight on admission was 179 and got as high as 188 after IVF. Discharge weight was 181.  He was admitted in 10/16 with a cerebellar CVA.  He has some resultant imbalance.   He was again admitted in 11/16 with acute on chronic systolic CHF.  This was a short admission that appeared to be precipitated by a sodium load from eating at a [employer] .   3/18 Echo showed stable EF 40-45%.  He was also having chest pain so I did a Cardiolite in 3/18, no ischemia.   He was admitted in 11/18 for elective hernia repair. Post-op, he developed hypoxemia and had a rehab stay.  He is now off oxygen.   Creatinine increased to 2.97 in 1/19.  I cut back on losartan and Lasix.  He developed increased lower extremity edema and we increased Lasix back to 120 qam/80 qpm.  He has been stable recently.  BP is high today but he has not taken his morning meds yet.  Dyspnea with moderate to heavy activity only, no problems with ADLs.  No chest pain.  No orthopnea/PND.  Minimal claudication.    Labs (12/14): K 4.8, creatinine 1.5 Labs (5/15): LDL 92 Labs (11/15): K 4.1, creatinine 1.5 Labs (12/15): K 4, creatinine 1.5 Labs (1/16): LFTs normal, LDL  51, HDL 47 Labs (5/16): TnI 0.09 Labs (6/16): K 4.8 => 4.4, creatinine 1.79 => 2.26, HCT 28.7 Labs (7/16) K 4.2, creatinine 1.98, LFTs normal, TSH normal Labs (8/16): HCT 27.1 Labs (9/16): hgb 8.1, K 4.3, creatinine 1.8, LFTs normal, TSH elevated Labs (10/16): LDL 74, HDL 45, TSH 5.19 (increased), free T3 and free T4 normal.  Labs (11/16): K 3.5, creatinine 1.47, AST 84, ALT 89 Labs (12/16): K 4.3, creatinine 1.58, AST 43, ALT 42 Labs (3/17): K 4.2, creatinine 1.4, hgb 10.7, AST 36, ALT 45 Labs (6/17): K 4, creatinine 1.69, hgb 10.9, proBNP 389, TSH mildly elevated Labs (8/17): Free T4 and T3 normal, TSH mildly elevated, K 4.2, creatinine 1.7, LFTs normal, hgb 10.4 Labs (12/17): TSH elevated  but free T3 and T4 normal, K 4.5, creatinine 1.9, LDL 45, HDL 39, LFTs normal, HCT 32.9 Labs (1/18): K 4.4, creatinine 1.93 Labs (3/18): K 4.3, creatinine 1.87, BNP 187, hgb 11.1, LDL 75, LFTs normal Labs (4/18): K 4.1, creatinine 1.7 Labs (9/18): K 4, creatinine 1.8, hgb 11.9 Labs (10/18): free T4 0.4, free T3 1.9, TSH 139  Labs (12/18): K 3.4, creatinine 1.76, hgb 12.2 Labs (1/19): K 4 => 4.9, creatinine 2.7 => 2.97, TSH 179, LFTs normal Labs (2/19): K 4.6, creatinine 2.19 Labs (4/19): LDL 61, HDL 36, K 3.9, creatinine 1.77, LFTs normal  Labs (6/19): hgb 11.7, K 3.8, creatinine 2.09, TSH 10 Labs (9/19): K 3.9, creatinine 1.69 Labs (12/19): K 3.5, creatinine 1.69, LDL 75, HDL 39, TSH normal  PMH: 1. CKD stage 3 2. HTN 3. Hyperlipidemia 4. AAA: s/p surgery with left renal artery bypass in 1997 - Abdominal US 10/18 with 3.2 AAA 5. GERD 6. IBS 7. Melanoma s/p reception 8. Diabetes: Diet-controlled 9. Carotid stenosis: Carotid dopplers (4/15) with 40-59% bilateral stenosis. Carotid dopplers (7/16) with < 40% BICA stenosis.  Carotid dopplers (10/16) with 40-59% BICA stenosis.  - Carotids (8/17) with minimal disease.  - Carotids (10/18) with 1-39% BICA stenosis.  - Carotids (1/19): 1-39% BICA stenosis 10. PAD: 9/14 ABIs 0.91 right 1.09 left.  7/16 ABIs 0.86 right, 1.1 left; aortoiliac duplex with < 50% bilateral iliac stenosis.  - Aortoiliac duplex (8/17) with < 50% bilateral iliac stenosis.   - ABIs (10/17): left 0.91, right 0.98, left TBI 0.44 (abnormal).  - ABIs (7/18): right 0.97, right 0.75 - ABIs (1/19): right 1.04, left 1.03 - ABIs (7/19): right 1.13, noncompressible left ABI but TBI 0.93 11. CAD: Has super-dominant RCA. s/p CABG 1992.  He had Taxus DES to mid PDA in 5/02. LHC (1/05) with 90% ostial PDA (had DES to this vessel), totally occluded RIMA-PDA, totally occluded PLV, sequential SVG-PLV with 1 branch occluded.  LHC (11/15) with left main off the right cusp, 40-50% ostial  left main, total occlusion of the proximal LAD (small vessel) with right to left collaterals, large super-dominant RCA with 50% ISR mid RCA, 50% ostial PDA, patent SVG-PLV, RIMA-PDA known atretic. Lexiscan Cardiolite (6/16) with EF 42%, prior anterolateral MI, no ischemia.  - Lexiscan Cardiolite (3/18): EF 51%, basal to mid anterolateral fixed defect with no ischemia.  12. Ischemic cardiomyopathy: Echo (12/09) with EF 45-50%, basal to mid inferolateral hypokinesis. Echo (6/15) with EF 45-50%, akinesis of the mid to apical inferolateral wall.  Echo (6/16) with EF 50-55%, mild LVH, inferior hypokinesis, mild MR.  Echo (10/16) with EF 40-45%, moderate LVH, mildly decreased RV systolic function.  - Echo (3/18) with EF 40-45%, mid anterior hypokinesis.  - Echo (2/19) with  EF 60-65%, mild LVH, trade II diastolic dysfunction.  13. OA 14. Atrial fibrillation/flutter: Paroxysmal.  Noted during 5/16 hospitalization in [place] and in 6/16.  He developed atrial flutter in 8/16, back in NSR by 9/16.  15. Emphysema: PFTs (8/16) with FVC 88%, FEV1 74%, ratio 81%, TLC 83%, DLCO 31%. - PFTs (12/18): FVC 64%, FEV1 69%, ratio 105%, TLC 82%, DLCO 33% => mild obstruction, severely decreased DLCO (similar to prior).   16. Renal cysts 17. Idiopathic peripheral neuropathy.  18. Anemia.  4. 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  20. Hernia repair (11/18) 21. Hypothyroidism 22. Bronchiectasis: CT chest 1/19 with bronchiectasis.  23. Lung nodule: 1.3 cm RML nodule on 1/19 CT, was noted in 2014 => slow growing. Possibly malignant.  Decision made with pulmonary to watch and avoid biopsy.  24. Suspect chronic aspiration with bronchiectasis.    Current Outpatient Medications  Medication Sig Dispense Refill  . amiodarone (PACERONE) 200 MG tablet Take 0.5 tablets (100 mg total) by mouth daily. 45 tablet 3  . atorvastatin (LIPITOR) 80 MG tablet Take 1 tablet (80 mg total) by mouth daily. 30 tablet 6  . carvedilol (COREG) 12.5 MG tablet  TAKE 1 TABLET BY MOUTH TWICE DAILY. 180 tablet 3  . cilostazol (PLETAL) 100 MG tablet TAKE 1 TABLET BY MOUTH TWICE DAILY. 180 tablet 0  . ELIQUIS 2.5 MG TABS tablet TAKE 1 TABLET BY MOUTH TWICE DAILY. 180 tablet 0  . ezetimibe (ZETIA) 10 MG tablet TAKE (1) TABLET DAILY. 90 tablet 0  . FLUoxetine (PROZAC) 20 MG capsule TAKE (1) CAPSULE DAILY. 90 capsule 0  . furosemide (LASIX) 80 MG tablet TAKE 1&1/2 TABLETS IN THE MORNING AND 1 TABLET IN THE AFTERNOON. 225 tablet 0  . glucose blood (ACCU-CHEK AVIVA PLUS) test strip CHECK BLOOD SUGAR 4 TIMES A DAY. 100 each 11  . hydrALAZINE (APRESOLINE) 50 MG tablet Take 1 tablet (50 mg total) by mouth 3 (three) times daily. 270 tablet 1  . HYDROcodone-acetaminophen (NORCO/VICODIN) 5-325 MG tablet hydrocodone 5 mg-acetaminophen 325 mg tablet    . Influenza Vac A&B Surf Ant Adj (FLUAD) 0.5 ML SUSY     . isosorbide mononitrate (IMDUR) 30 MG 24 hr tablet TAKE 1 TABLET EACH DAY. 90 tablet 0  . Lancets MISC Check blood sugar 4 times a day 100 each 11  . levothyroxine (SYNTHROID, LEVOTHROID) 125 MCG tablet Take 1 tablet (125 mcg total) by mouth daily before breakfast. 90 tablet 1  . Multiple Vitamin (MULTIVITAMIN) tablet Take 1 tablet by mouth daily.    . nitroGLYCERIN (NITROSTAT) 0.4 MG SL tablet DISSOLVE 1 TABLET UNDER TONGUE AS NEEDED FOR CHEST PAIN,MAY REPEAT IN5 MINUTES FOR 2 DOSES. 25 tablet 3  . OXYCODONE HCL PO oxycodone 5 mg tablet    . Potassium Chloride ER 20 MEQ TBCR Take 20 mEq by mouth daily. 30 tablet 3  . potassium chloride SA (K-DUR) 20 MEQ tablet TAKE 1 TABLET ONCE DAILY. 90 tablet 0  . predniSONE (DELTASONE) 10 MG tablet prednisone 10 mg tablet    . SPIRIVA HANDIHALER 18 MCG inhalation capsule PLACE 1 CAPSULE INTO INHALER AND INHALE ONCE DAILY. 90 capsule 0  . temazepam (RESTORIL) 30 MG capsule TAKE 1 CAPSULE AT BEDTIME AS NEEDED FOR SLEEP. 30 capsule 0  . traMADol (ULTRAM) 50 MG tablet Take 1 tablet (50 mg total) by mouth every 6 (six) hours as  needed. (Patient not taking: Reported on 02/21/2019) 12 tablet 0  . UNABLE TO FIND nifedipine ER 30 mg tablet,extended release  No current facility-administered medications for this encounter.     Allergies:   Metoprolol; Metformin; and Metformin and related   Social History:  The patient  reports that he has quit smoking. His smoking use included cigarettes. He has a 52.50 pack-year smoking history. He has never used smokeless tobacco. He reports current alcohol use of about 3.0 standard drinks of alcohol per week. He reports that he does not use drugs.   Family History:  The patient's family history includes Colon cancer in his father; Coronary artery disease in his brother; Diabetes in his brother and son; Heart disease in his brother; Hyperlipidemia in his brother and son; Peripheral vascular disease in his brother; Throat cancer in his brother.   ROS:  Please see the history of present illness.   All other systems are personally reviewed and negative.   Exam:  (Video/Tele Health Call; Exam is subjective and or/visual.) BP 160/82, HR 66, oxygen saturation 94% General:  Speaks in full sentences. No resp difficulty. Neck: No JVD Lungs: Normal respiratory effort with conversation.  Abdomen: Non-distended per patient report Extremities: Pt denies edema. Neuro: Alert & oriented x 3.   Recent Labs: 09/16/2018: Hemoglobin 13.9; Platelets 193 12/24/2018: ALT 31; BUN 28; Creatinine, Ser 1.69; Potassium 3.5; Sodium 139; TSH 3.664  Personally reviewed   Wt Readings from Last 3 Encounters:  04/26/19 85.3 kg (188 lb)  04/18/19 85.7 kg (189 lb)  04/11/19 85.7 kg (189 lb)      ASSESSMENT AND PLAN:  1. CAD: s/p CABG.  Cath in 11/15 showed occluded LAD with left to right collaterals.  The LAD was a relatively small vessel (super-dominant right).  Managed medically. Lexiscan Cardiolite in 6/16 with infarction but no ischemia.  Cardiolite 3/18 with infarction, no ischemia. No chest pain.  -  Continue ASA and statin.  2. Ischemic cardiomyopathy with systolic CHF: EF 89-38% on 3/18 echo.  Echo 2/19 with LVEF improved to 60-65%. NYHA II. He is not volume overloaded on exam. Weight is down about 2 lbs.  He is off losartan with elevated creatinine.  - Continue Lasix 120 qam/80 qpm.  I will arrange for a BMET.  - Continue hydralazine 50 mg tid.    - Continue Imdur 30 mg daily.    - Continue Coreg 12.5 mg bid.  - Not on ACEi/ARB with elevated creatinine.    3. Carotid stenosis: Stable. Followed at [facility].  4. PAD: Improved claudication, ABIs have normalized when last checked.  No pedal ulcers.   - Follows at [facility]. Plan for medical management.  - Continue cilostazol, unable to come off due to worsening claudication when he stops it.  5. Hyperlipidemia: Continue atorvastatin, good lipids in 12/19.   6. CKD III:  BMET today. Off losartan.  7. Atrial fibrillation/flutter:  Paroxysmal.  NSR today.  - Continue amiodarone 100 mg daily. Will need to get regular eye exams with amiodarone use.  High resolution CT in 1/19 did not appear to show evidence for amiodarone lung toxicity. I will arrange for LFTs, TSH followed by PCP (hypothyroidism).  - Continue Eliquis 2.5 bid. I will arrange for CBC.  8. Suspected OSA:  Says that he would not use CPAP.  9. COPD: He is now off oxygen. He has bronchiectasis thought to be due to chronic aspiration. - Follows with Dr. Lake Bells.  10. CVA: Cerebellar, 10/16.  He has had some resulting imbalance.  - He is no longer driving.  11. Hypertension: BP has been controlled when he takes  his medications.  Discussed compliance again, followed by paramedicine.     12. Hypothyroidism: Likely related to amiodarone. - Followed by PCP.  13. Pulmonary nodule: 1.3 cm RML nodule on 1/19 CT chest.  It was also seen in 2014 => slow growing.  Dr Lake Bells has discussed with wife and they have decided not to go forward with biopsy. They would not be interested in lung cancer treatment.    COVID screen The patient does not have any symptoms that suggest any further testing/ screening at this time.  Social distancing reinforced today.  Patient Risk: After full review of this patients clinical status, I feel that they are at moderate risk for cardiac decompensation at this time.  Relevant cardiac medications were reviewed at length with the patient today. The patient does not have concerns regarding their medications at this time.   Recommended follow-up:  3 months.   Today, I have spent 18 minutes with the patient with telehealth technology discussing the above issues .    Signed, Loralie Champagne, MD  04/28/2019  [place] [address] [facility] [place] [address] [phone] (office) [phone] (fax)

## 2019-04-30 ENCOUNTER — Ambulatory Visit (HOSPITAL_COMMUNITY)
Admission: RE | Admit: 2019-04-30 | Discharge: 2019-04-30 | Disposition: A | Discharge status: Home / Self Care | Payer: Medicare Other | Source: Ambulatory Visit | Attending: Internal Medicine | Admitting: Internal Medicine

## 2019-04-30 ENCOUNTER — Other Ambulatory Visit: Payer: Self-pay

## 2019-04-30 DIAGNOSIS — I5032 Chronic diastolic (congestive) heart failure: Secondary | ICD-10-CM | POA: Insufficient documentation

## 2019-04-30 LAB — COMPREHENSIVE METABOLIC PANEL
ALT: 32 U/L (ref 0–44)
AST: 33 U/L (ref 15–41)
Albumin: 3.8 g/dL (ref 3.5–5.0)
Alkaline Phosphatase: 60 U/L (ref 38–126)
Anion gap: 10 (ref 5–15)
BUN: 31 mg/dL — ABNORMAL HIGH (ref 8–23)
CO2: 27 mmol/L (ref 22–32)
Calcium: 9.1 mg/dL (ref 8.9–10.3)
Chloride: 104 mmol/L (ref 98–111)
Creatinine, Ser: 1.89 mg/dL — ABNORMAL HIGH (ref 0.61–1.24)
GFR calc Af Amer: 36 mL/min — ABNORMAL LOW (ref >60)
GFR calc non Af Amer: 31 mL/min — ABNORMAL LOW (ref >60)
Glucose, Bld: 161 mg/dL — ABNORMAL HIGH (ref 70–99)
Potassium: 3.9 mmol/L (ref 3.5–5.1)
Sodium: 141 mmol/L (ref 135–145)
Total Bilirubin: 0.6 mg/dL (ref 0.3–1.2)
Total Protein: 6.7 g/dL (ref 6.5–8.1)

## 2019-05-02 ENCOUNTER — Other Ambulatory Visit (HOSPITAL_COMMUNITY): Payer: Self-pay

## 2019-05-02 NOTE — Progress Notes (Signed)
Paramedicine Encounter    Patient ID: Andrew [name], male    DOB: 1928-08-29, 85 y.o.   MRN: [id number]   Patient Care Team: [name], MD as PCP - General (Family Medicine) Larey Dresser, MD as PCP - Advanced Heart Failure (Cardiology) Larey Dresser, MD as Consulting Physician (Cardiology) Jorge Ny, LCSW as Social Worker (Licensed Clinical Social Worker)  Patient Active Problem List   Diagnosis Date Noted  . Chronic respiratory failure with hypoxia ([place]) 09/16/2018  . Bilateral recurrent inguinal hernias 11/28/2017  . Calf pain 05/25/2016  . Carotid artery stenosis 01/12/2016  . Chronic systolic CHF (congestive heart failure) ([place]) 12/02/2015  . Cerebellar infarct ([place]) 10/16/2015  . 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 ([place]) 10/16/2015  . PVD (peripheral vascular disease) ([place])   . Hereditary and idiopathic peripheral neuropathy 10/02/2015  . Peripelvic (lymphatic) cyst   . Pernicious anemia 07/15/2015  . Bilateral renal cysts 07/12/2015  . Lung nodule, 106mm RML CT 07/12/15 07/12/2015  . Constipation 07/12/2015  . Kidney stone on right side 07/12/2015  . Stage 3 chronic kidney disease ([place]) 06/14/2015  . Chronic diastolic CHF (congestive heart failure) ([place]) 05/28/2015  . Paroxysmal atrial fibrillation ([place]) 05/28/2015  . AAA (abdominal aortic aneurysm) without rupture ([place]) 09/30/2014  . Left-sided low back pain without sciatica 07/21/2014  . Cardiomyopathy, ischemic 05/23/2014  . DM (diabetes mellitus), type 2 with renal complications ([place]) 72/53/6644  . CORONARY ATHEROSCLEROSIS NATIVE CORONARY ARTERY 11/09/2010  . Depression, major, single episode, complete remission ([place]) 03/18/2010  . Hypothyroidism 02/03/2009  . Osteoarthritis 05/27/2008  . Hyperlipidemia associated with type 2 diabetes mellitus ([place]) 06/14/2007  . Hypertension associated with diabetes ([place]) 06/14/2007  . GERD 06/14/2007    Current Outpatient Medications:  .   amiodarone (PACERONE) 200 MG tablet, Take 0.5 tablets (100 mg total) by mouth daily., Disp: 45 tablet, Rfl: 3 .  atorvastatin (LIPITOR) 80 MG tablet, Take 1 tablet (80 mg total) by mouth daily., Disp: 30 tablet, Rfl: 6 .  carvedilol (COREG) 12.5 MG tablet, TAKE 1 TABLET BY MOUTH TWICE DAILY., Disp: 180 tablet, Rfl: 3 .  cilostazol (PLETAL) 100 MG tablet, TAKE 1 TABLET BY MOUTH TWICE DAILY., Disp: 180 tablet, Rfl: 0 .  ELIQUIS 2.5 MG TABS tablet, TAKE 1 TABLET BY MOUTH TWICE DAILY., Disp: 180 tablet, Rfl: 0 .  ezetimibe (ZETIA) 10 MG tablet, TAKE (1) TABLET DAILY., Disp: 90 tablet, Rfl: 0 .  FLUoxetine (PROZAC) 20 MG capsule, TAKE (1) CAPSULE DAILY., Disp: 90 capsule, Rfl: 0 .  furosemide (LASIX) 80 MG tablet, TAKE 1&1/2 TABLETS IN THE MORNING AND 1 TABLET IN THE AFTERNOON., Disp: 225 tablet, Rfl: 0 .  hydrALAZINE (APRESOLINE) 50 MG tablet, Take 1 tablet (50 mg total) by mouth 3 (three) times daily., Disp: 270 tablet, Rfl: 1 .  HYDROcodone-acetaminophen (NORCO/VICODIN) 5-325 MG tablet, hydrocodone 5 mg-acetaminophen 325 mg tablet, Disp: , Rfl:  .  isosorbide mononitrate (IMDUR) 30 MG 24 hr tablet, TAKE 1 TABLET EACH DAY., Disp: 90 tablet, Rfl: 0 .  Lancets MISC, Check blood sugar 4 times a day, Disp: 100 each, Rfl: 11 .  levothyroxine (SYNTHROID, LEVOTHROID) 125 MCG tablet, Take 1 tablet (125 mcg total) by mouth daily before breakfast., Disp: 90 tablet, Rfl: 1 .  Multiple Vitamin (MULTIVITAMIN) tablet, Take 1 tablet by mouth daily., Disp: , Rfl:  .  nitroGLYCERIN (NITROSTAT) 0.4 MG SL tablet, DISSOLVE 1 TABLET UNDER TONGUE AS NEEDED FOR CHEST PAIN,MAY REPEAT IN5 MINUTES FOR 2  DOSES., Disp: 25 tablet, Rfl: 3 .  potassium chloride SA (K-DUR) 20 MEQ tablet, TAKE 1 TABLET ONCE DAILY., Disp: 90 tablet, Rfl: 0 .  SPIRIVA HANDIHALER 18 MCG inhalation capsule, PLACE 1 CAPSULE INTO INHALER AND INHALE ONCE DAILY., Disp: 90 capsule, Rfl: 0 .  temazepam (RESTORIL) 30 MG capsule, TAKE 1 CAPSULE AT BEDTIME AS  NEEDED FOR SLEEP., Disp: 30 capsule, Rfl: 0 .  glucose blood (ACCU-CHEK AVIVA PLUS) test strip, CHECK BLOOD SUGAR 4 TIMES A DAY., Disp: 100 each, Rfl: 11 .  Influenza Vac A&B Surf Ant Adj (FLUAD) 0.5 ML SUSY, , Disp: , Rfl:  .  OXYCODONE HCL PO, oxycodone 5 mg tablet, Disp: , Rfl:  .  Potassium Chloride ER 20 MEQ TBCR, Take 20 mEq by mouth daily., Disp: 30 tablet, Rfl: 3 .  predniSONE (DELTASONE) 10 MG tablet, prednisone 10 mg tablet, Disp: , Rfl:  .  traMADol (ULTRAM) 50 MG tablet, Take 1 tablet (50 mg total) by mouth every 6 (six) hours as needed. (Patient not taking: Reported on 02/21/2019), Disp: 12 tablet, Rfl: 0 .  UNABLE TO FIND, nifedipine ER 30 mg tablet,extended release, Disp: , Rfl:  Allergies  Allergen Reactions  . Metoprolol Shortness Of Breath, Palpitations and Other (See Comments)    Heart starts racing. Shallow breathing; pt states he also get skin irritation on his legs  . Metformin Hives and Swelling    On legs  . Metformin And Related Nausea And Vomiting      Social History   Socioeconomic History  . Marital status: Married    Spouse name: Not on file  . Number of children: Not on file  . Years of education: Not on file  . Highest education level: Not on file  Occupational History  . 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 Financial resource strain: Not on file  . Food insecurity:    Worry: Not on file    Inability: Not on file  . Transportation needs:    Medical: Not on file    Non-medical: Not on file  Tobacco Use  . Smoking status: Former Smoker    Packs/day: 1.50    Years: 35.00    Pack years: 52.50    Types: Cigarettes  . Smokeless tobacco: Never Used  . Tobacco comment: quit atleast 25 yrs ago, per pt  Substance and Sexual Activity  . Alcohol use: Yes    Alcohol/week: 3.0 standard drinks    Types: 3 Shots of liquor per week    Comment: socially  . Drug use: No  . Sexual activity: Yes  Lifestyle  . Physical activity:    Days per week: Not on  file    Minutes per session: Not on file  . Stress: Not on file  Relationships  . Social connections:    Talks on phone: Not on file    Gets together: Not on file    Attends religious service: Not on file    Active member of club or organization: Not on file    Attends meetings of clubs or organizations: Not on file    Relationship status: Not on file  . Intimate partner violence:    Fear of current or ex partner: Not on file    Emotionally abused: Not on file    Physically abused: Not on file    Forced sexual activity: Not on file  Other Topics Concern  . Not on file  Social History Narrative   Pt lives in [place]  with spouse.   Retired from [employer].  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    Physical Exam Cardiovascular:     Rate and Rhythm: Normal rate and regular rhythm.     Pulses: Normal pulses.  Pulmonary:     Effort: Pulmonary effort is normal.     Breath sounds: Normal breath sounds.  Musculoskeletal: Normal range of motion.     Right lower leg: No edema.     Left lower leg: No edema.  Skin:    General: Skin is warm and dry.     Capillary Refill: Capillary refill takes less than 2 seconds.  Neurological:     Mental Status: He is alert and oriented to person, place, and time.  Psychiatric:        Mood and Affect: Mood normal.         Future Appointments  Date Time Provider [place]  05/22/2019  2:45 PM Gardiner Barefoot, DPM [facility]  07/30/2019  1:00 PM [facility] ECHO [facility]  07/30/2019  2:00 PM Larey Dresser, MD [facility] None    BP 125/60 (BP Location: Left Arm, Patient Position: Sitting, Cuff Size: Normal)   Pulse 80   Resp 16   Wt 189 lb (85.7 kg)   SpO2 92%   BMI 29.60 kg/m   Weight yesterday- Did not weigh Last visit weight- 188 lb  Andrew [name] was seen at home today and re[ported feeling well. He denied chest pain, SOB, headache, dizziness, orthopnea, cough or  fever since our last visit. He stated he has been compliant with his medications over the past week however while preparing to refill his pillbox, I found that he had missed 5 night doses over the past week. He stated he did not realize he was missing the evening doses so often and he would work on doing better. I suggested he use an alarm on their cell phone to remind him to take the medications at dinner time. He was agreeable and had me set his evening alarm for 18:30. I will follow up with him over the next several days to ensure this is working. His medications were verified and his pillbox was refilled.   [name], EMT 05/02/19  ACTION: Home visit completed Next visit planned for 1 week

## 2019-05-03 ENCOUNTER — Ambulatory Visit: Payer: Self-pay | Admitting: Surgery

## 2019-05-03 ENCOUNTER — Other Ambulatory Visit: Payer: Self-pay | Admitting: Surgery

## 2019-05-03 DIAGNOSIS — R102 Pelvic and perineal pain: Secondary | ICD-10-CM

## 2019-05-06 ENCOUNTER — Other Ambulatory Visit: Payer: Self-pay

## 2019-05-06 ENCOUNTER — Ambulatory Visit
Admission: RE | Admit: 2019-05-06 | Discharge: 2019-05-06 | Disposition: A | Discharge status: Home / Self Care | Payer: Medicare Other | Source: Ambulatory Visit | Attending: Surgery | Admitting: Surgery

## 2019-05-06 DIAGNOSIS — R102 Pelvic and perineal pain: Secondary | ICD-10-CM

## 2019-05-08 ENCOUNTER — Telehealth (HOSPITAL_COMMUNITY): Payer: Self-pay

## 2019-05-08 NOTE — Telephone Encounter (Signed)
I called Mr Andrew [name] to schedule an appointment. He did not answer and there was no vocemail available to leave a message. I will reach out again tomorrow.

## 2019-05-08 NOTE — Telephone Encounter (Signed)
Andrew [name] called me and advised she had not been home when I called and Andrew [name] was playing piano and did not hear the phone ring. She stated they would be available anytime tomorrow so we agreed to meet at 11:30. I advised I would reach out if anything should change before then.

## 2019-05-09 ENCOUNTER — Other Ambulatory Visit (HOSPITAL_COMMUNITY): Payer: Self-pay

## 2019-05-09 NOTE — Progress Notes (Signed)
Paramedicine Encounter    Patient ID: Andrew [name], male    DOB: [birth year], 85 y.o.   MRN: [id number]   Patient Care Team: [name], MD as PCP - General (Family Medicine) Larey Dresser, MD as PCP - Advanced Heart Failure (Cardiology) Larey Dresser, MD as Consulting Physician (Cardiology) Jorge Ny, LCSW as Social Worker (Licensed Clinical Social Worker)  Patient Active Problem List   Diagnosis Date Noted  . Chronic respiratory failure with hypoxia ([place]) 09/16/2018  . Bilateral recurrent inguinal hernias 11/28/2017  . Calf pain 05/25/2016  . Carotid artery stenosis 01/12/2016  . Chronic systolic CHF (congestive heart failure) ([place]) 12/02/2015  . Cerebellar infarct ([place]) 10/16/2015  . 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 ([place]) 10/16/2015  . PVD (peripheral vascular disease) ([place])   . Hereditary and idiopathic peripheral neuropathy 10/02/2015  . Peripelvic (lymphatic) cyst   . Pernicious anemia 07/15/2015  . Bilateral renal cysts 07/12/2015  . Lung nodule, 43mm RML CT 07/12/15 07/12/2015  . Constipation 07/12/2015  . Kidney stone on right side 07/12/2015  . Stage 3 chronic kidney disease ([place]) 06/14/2015  . Chronic diastolic CHF (congestive heart failure) ([place]) 05/28/2015  . Paroxysmal atrial fibrillation ([place]) 05/28/2015  . AAA (abdominal aortic aneurysm) without rupture ([place]) 09/30/2014  . Left-sided low back pain without sciatica 07/21/2014  . Cardiomyopathy, ischemic 05/23/2014  . DM (diabetes mellitus), type 2 with renal complications ([place]) 50/08/3817  . CORONARY ATHEROSCLEROSIS NATIVE CORONARY ARTERY 11/09/2010  . Depression, major, single episode, complete remission ([place]) 03/18/2010  . Hypothyroidism 02/03/2009  . Osteoarthritis 05/27/2008  . Hyperlipidemia associated with type 2 diabetes mellitus ([place]) 06/14/2007  . Hypertension associated with diabetes ([place]) 06/14/2007  . GERD 06/14/2007    Current Outpatient Medications:  .   amiodarone (PACERONE) 200 MG tablet, Take 0.5 tablets (100 mg total) by mouth daily., Disp: 45 tablet, Rfl: 3 .  atorvastatin (LIPITOR) 80 MG tablet, Take 1 tablet (80 mg total) by mouth daily., Disp: 30 tablet, Rfl: 6 .  carvedilol (COREG) 12.5 MG tablet, TAKE 1 TABLET BY MOUTH TWICE DAILY., Disp: 180 tablet, Rfl: 3 .  cilostazol (PLETAL) 100 MG tablet, TAKE 1 TABLET BY MOUTH TWICE DAILY., Disp: 180 tablet, Rfl: 0 .  ELIQUIS 2.5 MG TABS tablet, TAKE 1 TABLET BY MOUTH TWICE DAILY., Disp: 180 tablet, Rfl: 0 .  ezetimibe (ZETIA) 10 MG tablet, TAKE (1) TABLET DAILY., Disp: 90 tablet, Rfl: 0 .  FLUoxetine (PROZAC) 20 MG capsule, TAKE (1) CAPSULE DAILY., Disp: 90 capsule, Rfl: 0 .  furosemide (LASIX) 80 MG tablet, TAKE 1&1/2 TABLETS IN THE MORNING AND 1 TABLET IN THE AFTERNOON., Disp: 225 tablet, Rfl: 0 .  hydrALAZINE (APRESOLINE) 50 MG tablet, Take 1 tablet (50 mg total) by mouth 3 (three) times daily., Disp: 270 tablet, Rfl: 1 .  HYDROcodone-acetaminophen (NORCO/VICODIN) 5-325 MG tablet, hydrocodone 5 mg-acetaminophen 325 mg tablet, Disp: , Rfl:  .  isosorbide mononitrate (IMDUR) 30 MG 24 hr tablet, TAKE 1 TABLET EACH DAY., Disp: 90 tablet, Rfl: 0 .  levothyroxine (SYNTHROID, LEVOTHROID) 125 MCG tablet, Take 1 tablet (125 mcg total) by mouth daily before breakfast., Disp: 90 tablet, Rfl: 1 .  Multiple Vitamin (MULTIVITAMIN) tablet, Take 1 tablet by mouth daily., Disp: , Rfl:  .  nitroGLYCERIN (NITROSTAT) 0.4 MG SL tablet, DISSOLVE 1 TABLET UNDER TONGUE AS NEEDED FOR CHEST PAIN,MAY REPEAT IN5 MINUTES FOR 2 DOSES., Disp: 25 tablet, Rfl: 3 .  potassium chloride SA (K-DUR) 20 MEQ tablet, TAKE  1 TABLET ONCE DAILY., Disp: 90 tablet, Rfl: 0 .  SPIRIVA HANDIHALER 18 MCG inhalation capsule, PLACE 1 CAPSULE INTO INHALER AND INHALE ONCE DAILY., Disp: 90 capsule, Rfl: 0 .  temazepam (RESTORIL) 30 MG capsule, TAKE 1 CAPSULE AT BEDTIME AS NEEDED FOR SLEEP., Disp: 30 capsule, Rfl: 0 .  glucose blood (ACCU-CHEK AVIVA  PLUS) test strip, CHECK BLOOD SUGAR 4 TIMES A DAY., Disp: 100 each, Rfl: 11 .  Influenza Vac A&B Surf Ant Adj (FLUAD) 0.5 ML SUSY, , Disp: , Rfl:  .  Lancets MISC, Check blood sugar 4 times a day, Disp: 100 each, Rfl: 11 .  OXYCODONE HCL PO, oxycodone 5 mg tablet, Disp: , Rfl:  .  Potassium Chloride ER 20 MEQ TBCR, Take 20 mEq by mouth daily., Disp: 30 tablet, Rfl: 3 .  predniSONE (DELTASONE) 10 MG tablet, prednisone 10 mg tablet, Disp: , Rfl:  .  traMADol (ULTRAM) 50 MG tablet, Take 1 tablet (50 mg total) by mouth every 6 (six) hours as needed. (Patient not taking: Reported on 02/21/2019), Disp: 12 tablet, Rfl: 0 .  UNABLE TO FIND, nifedipine ER 30 mg tablet,extended release, Disp: , Rfl:  Allergies  Allergen Reactions  . Metoprolol Shortness Of Breath, Palpitations and Other (See Comments)    Heart starts racing. Shallow breathing; pt states he also get skin irritation on his legs  . Metformin Hives and Swelling    On legs  . Metformin And Related Nausea And Vomiting      Social History   Socioeconomic History  . Marital status: Married    Spouse name: Not on file  . Number of children: Not on file  . Years of education: Not on file  . Highest education level: Not on file  Occupational History  . 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 Financial resource strain: Not on file  . Food insecurity:    Worry: Not on file    Inability: Not on file  . Transportation needs:    Medical: Not on file    Non-medical: Not on file  Tobacco Use  . Smoking status: Former Smoker    Packs/day: 1.50    Years: 35.00    Pack years: 52.50    Types: Cigarettes  . Smokeless tobacco: Never Used  . Tobacco comment: quit atleast 25 yrs ago, per pt  Substance and Sexual Activity  . Alcohol use: Yes    Alcohol/week: 3.0 standard drinks    Types: 3 Shots of liquor per week    Comment: socially  . Drug use: No  . Sexual activity: Yes  Lifestyle  . Physical activity:    Days per week: Not on file     Minutes per session: Not on file  . Stress: Not on file  Relationships  . Social connections:    Talks on phone: Not on file    Gets together: Not on file    Attends religious service: Not on file    Active member of club or organization: Not on file    Attends meetings of clubs or organizations: Not on file    Relationship status: Not on file  . Intimate partner violence:    Fear of current or ex partner: Not on file    Emotionally abused: Not on file    Physically abused: Not on file    Forced sexual activity: Not on file  Other Topics Concern  . Not on file  Social History Narrative   Pt lives in [place]  with spouse.   Retired from [employer].  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    Physical Exam Cardiovascular:     Rate and Rhythm: Normal rate and regular rhythm.     Pulses: Normal pulses.  Pulmonary:     Effort: Pulmonary effort is normal.     Breath sounds: Normal breath sounds.  Musculoskeletal: Normal range of motion.     Right lower leg: No edema.     Left lower leg: No edema.  Skin:    General: Skin is warm and dry.     Capillary Refill: Capillary refill takes less than 2 seconds.  Neurological:     Mental Status: He is alert and oriented to person, place, and time.  Psychiatric:        Mood and Affect: Mood normal.         Future Appointments  Date Time Provider [place]  05/22/2019  2:45 PM Gardiner Barefoot, DPM [facility]  07/30/2019  1:00 PM [facility] E[facility]  07/30/2019  2:00 PM Larey Dresser, MD [facility] None    BP 106/64 (BP Location: Left Arm, Patient Position: Sitting, Cuff Size: Normal)   Pulse 72   Resp 16   Wt 187 lb (84.8 kg)   SpO2 95%   BMI 29.29 kg/m   Weight yesterday- 187 lb Last visit weight- 187 lb  Mr Christoffel was seen at home today and reported feeling well. He denied chest pain, SOB, headache, dizziness, orthopnea, cough or fever. He  stated he has been compliant with his medications and his weight has been stable. He has been using the alarm I set on his wife's phone to remind him to take his evening medications and it has been working very well. He had not missed any evening medications last week and only missed one morning dose. His medications were verified and his pillbox was refilled. I will follow up next week.   [name], EMT 05/09/19  ACTION: Home visit completed Next visit planned for 1 week

## 2019-05-16 ENCOUNTER — Other Ambulatory Visit (HOSPITAL_COMMUNITY): Payer: Self-pay

## 2019-05-16 NOTE — Progress Notes (Signed)
Paramedicine Encounter    Patient ID: Andrew [name], male    DOB: 1928-08-22, 85 y.o.   MRN: [id number]   Patient Care Team: [name], MD as PCP - General (Family Medicine) Larey Dresser, MD as PCP - Advanced Heart Failure (Cardiology) Larey Dresser, MD as Consulting Physician (Cardiology) Jorge Ny, LCSW as Social Worker (Licensed Clinical Social Worker)  Patient Active Problem List   Diagnosis Date Noted  . Chronic respiratory failure with hypoxia ([place]) 09/16/2018  . Bilateral recurrent inguinal hernias 11/28/2017  . Calf pain 05/25/2016  . Carotid artery stenosis 01/12/2016  . Chronic systolic CHF (congestive heart failure) ([place]) 12/02/2015  . Cerebellar infarct ([place]) 10/16/2015  . 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 ([place]) 10/16/2015  . PVD (peripheral vascular disease) ([place])   . Hereditary and idiopathic peripheral neuropathy 10/02/2015  . Peripelvic (lymphatic) cyst   . Pernicious anemia 07/15/2015  . Bilateral renal cysts 07/12/2015  . Lung nodule, 74m RML CT 07/12/15 07/12/2015  . Constipation 07/12/2015  . Kidney stone on right side 07/12/2015  . Stage 3 chronic kidney disease ([place] 06/14/2015  . Chronic diastolic CHF (congestive heart failure) ([place] 05/28/2015  . Paroxysmal atrial fibrillation ([place] 05/28/2015  . AAA (abdominal aortic aneurysm) without rupture ([place] 09/30/2014  . Left-sided low back pain without sciatica 07/21/2014  . Cardiomyopathy, ischemic 05/23/2014  . DM (diabetes mellitus), type 2 with renal complications ([place] 063/84/6659 . CORONARY ATHEROSCLEROSIS NATIVE CORONARY ARTERY 11/09/2010  . Depression, major, single episode, complete remission ([place] 03/18/2010  . Hypothyroidism 02/03/2009  . Osteoarthritis 05/27/2008  . Hyperlipidemia associated with type 2 diabetes mellitus ([place] 06/14/2007  . Hypertension associated with diabetes ([facility] 06/14/2007  . GERD 06/14/2007    Current Outpatient Medications:  .   amiodarone (PACERONE) 200 MG tablet, Take 0.5 tablets (100 mg total) by mouth daily., Disp: 45 tablet, Rfl: 3 .  atorvastatin (LIPITOR) 80 MG tablet, Take 1 tablet (80 mg total) by mouth daily., Disp: 30 tablet, Rfl: 6 .  carvedilol (COREG) 12.5 MG tablet, TAKE 1 TABLET BY MOUTH TWICE DAILY., Disp: 180 tablet, Rfl: 3 .  cilostazol (PLETAL) 100 MG tablet, TAKE 1 TABLET BY MOUTH TWICE DAILY., Disp: 180 tablet, Rfl: 0 .  ELIQUIS 2.5 MG TABS tablet, TAKE 1 TABLET BY MOUTH TWICE DAILY., Disp: 180 tablet, Rfl: 0 .  ezetimibe (ZETIA) 10 MG tablet, TAKE (1) TABLET DAILY., Disp: 90 tablet, Rfl: 0 .  FLUoxetine (PROZAC) 20 MG capsule, TAKE (1) CAPSULE DAILY., Disp: 90 capsule, Rfl: 0 .  furosemide (LASIX) 80 MG tablet, TAKE 1&1/2 TABLETS IN THE MORNING AND 1 TABLET IN THE AFTERNOON., Disp: 225 tablet, Rfl: 0 .  glucose blood (ACCU-CHEK AVIVA PLUS) test strip, CHECK BLOOD SUGAR 4 TIMES A DAY., Disp: 100 each, Rfl: 11 .  hydrALAZINE (APRESOLINE) 50 MG tablet, Take 1 tablet (50 mg total) by mouth 3 (three) times daily., Disp: 270 tablet, Rfl: 1 .  HYDROcodone-acetaminophen (NORCO/VICODIN) 5-325 MG tablet, hydrocodone 5 mg-acetaminophen 325 mg tablet, Disp: , Rfl:  .  isosorbide mononitrate (IMDUR) 30 MG 24 hr tablet, TAKE 1 TABLET EACH DAY., Disp: 90 tablet, Rfl: 0 .  levothyroxine (SYNTHROID, LEVOTHROID) 125 MCG tablet, Take 1 tablet (125 mcg total) by mouth daily before breakfast., Disp: 90 tablet, Rfl: 1 .  Multiple Vitamin (MULTIVITAMIN) tablet, Take 1 tablet by mouth daily., Disp: , Rfl:  .  nitroGLYCERIN (NITROSTAT) 0.4 MG SL tablet, DISSOLVE 1 TABLET UNDER TONGUE AS NEEDED FOR CHEST PAIN,MAY  REPEAT IN5 MINUTES FOR 2 DOSES., Disp: 25 tablet, Rfl: 3 .  potassium chloride SA (K-DUR) 20 MEQ tablet, TAKE 1 TABLET ONCE DAILY., Disp: 90 tablet, Rfl: 0 .  SPIRIVA HANDIHALER 18 MCG inhalation capsule, PLACE 1 CAPSULE INTO INHALER AND INHALE ONCE DAILY., Disp: 90 capsule, Rfl: 0 .  temazepam (RESTORIL) 30 MG  capsule, TAKE 1 CAPSULE AT BEDTIME AS NEEDED FOR SLEEP., Disp: 30 capsule, Rfl: 0 .  Influenza Vac A&B Surf Ant Adj (FLUAD) 0.5 ML SUSY, , Disp: , Rfl:  .  Lancets MISC, Check blood sugar 4 times a day, Disp: 100 each, Rfl: 11 .  OXYCODONE HCL PO, oxycodone 5 mg tablet, Disp: , Rfl:  .  Potassium Chloride ER 20 MEQ TBCR, Take 20 mEq by mouth daily., Disp: 30 tablet, Rfl: 3 .  predniSONE (DELTASONE) 10 MG tablet, prednisone 10 mg tablet, Disp: , Rfl:  .  traMADol (ULTRAM) 50 MG tablet, Take 1 tablet (50 mg total) by mouth every 6 (six) hours as needed. (Patient not taking: Reported on 02/21/2019), Disp: 12 tablet, Rfl: 0 .  UNABLE TO FIND, nifedipine ER 30 mg tablet,extended release, Disp: , Rfl:  Allergies  Allergen Reactions  . Metoprolol Shortness Of Breath, Palpitations and Other (See Comments)    Heart starts racing. Shallow breathing; pt states he also get skin irritation on his legs  . Metformin Hives and Swelling    On legs  . Metformin And Related Nausea And Vomiting      Social History   Socioeconomic History  . Marital status: Married    Spouse name: Not on file  . Number of children: Not on file  . Years of education: Not on file  . Highest education level: Not on file  Occupational History  . 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 Financial resource strain: Not on file  . Food insecurity:    Worry: Not on file    Inability: Not on file  . Transportation needs:    Medical: Not on file    Non-medical: Not on file  Tobacco Use  . Smoking status: Former Smoker    Packs/day: 1.50    Years: 35.00    Pack years: 52.50    Types: Cigarettes  . Smokeless tobacco: Never Used  . Tobacco comment: quit atleast 25 yrs ago, per pt  Substance and Sexual Activity  . Alcohol use: Yes    Alcohol/week: 3.0 standard drinks    Types: 3 Shots of liquor per week    Comment: socially  . Drug use: No  . Sexual activity: Yes  Lifestyle  . Physical activity:    Days per week: Not on  file    Minutes per session: Not on file  . Stress: Not on file  Relationships  . Social connections:    Talks on phone: Not on file    Gets together: Not on file    Attends religious service: Not on file    Active member of club or organization: Not on file    Attends meetings of clubs or organizations: Not on file    Relationship status: Not on file  . Intimate partner violence:    Fear of current or ex partner: Not on file    Emotionally abused: Not on file    Physically abused: Not on file    Forced sexual activity: Not on file  Other Topics Concern  . Not on file  Social History Narrative   Pt lives in [place]  with spouse.   Retired from [employer].  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    Physical Exam Cardiovascular:     Rate and Rhythm: Normal rate and regular rhythm.     Pulses: Normal pulses.  Pulmonary:     Effort: Pulmonary effort is normal.     Breath sounds: Normal breath sounds.  Musculoskeletal:     Right lower leg: No edema.     Left lower leg: No edema.  Skin:    General: Skin is warm and dry.     Capillary Refill: Capillary refill takes less than 2 seconds.  Neurological:     Mental Status: He is alert and oriented to person, place, and time.  Psychiatric:        Mood and Affect: Mood normal.         Future Appointments  Date Time Provider [place]  05/22/2019  2:45 PM Gardiner Barefoot, DPM [facility]  07/30/2019  1:00 PM [place] E[facility] OP 1 [facility]  07/30/2019  2:00 PM Larey Dresser, MD [facility] None    BP 107/61 (BP Location: Left Arm, Patient Position: Sitting, Cuff Size: Normal)   Pulse 65   Resp 16   Wt 184 lb 9.6 oz (83.7 kg)   SpO2 95%   BMI 28.91 kg/m   Weight yesterday- Did not weigh Last visit weight- 187 lb  Andrew [name] was seen at home today and reported feeling generally well. He denied chest pain,. SOB, headache, dizziness, orthopnea, cough or fever since  we last met. He stated he had been compliant with his medications over the past week and upon seeing his pillbox, he had not missed any doses. His weight is trending down however he stated he has not been eating as much lately due to a pain in his lower abdomen. He is scheduled to see a doctor for this tomorrow. His medications were verified and his pillbox was refilled. I asked that either Andrew or Andrew [name] call me after they leave the doctor tomorrow if they need assistance in adding any medications to his pillbox. The were understanding and agreeable.   [name], EMT 05/16/19  ACTION: Home visit completed Next visit planned for 1 week

## 2019-05-17 ENCOUNTER — Other Ambulatory Visit: Payer: Self-pay | Admitting: Surgery

## 2019-05-17 DIAGNOSIS — R102 Pelvic and perineal pain: Secondary | ICD-10-CM

## 2019-05-21 ENCOUNTER — Encounter

## 2019-05-22 ENCOUNTER — Encounter: Payer: Self-pay | Admitting: Podiatry

## 2019-05-22 ENCOUNTER — Other Ambulatory Visit: Payer: Self-pay

## 2019-05-22 ENCOUNTER — Ambulatory Visit: Payer: Medicare Other | Admitting: Podiatry

## 2019-05-22 VITALS — Temp 97.7°F

## 2019-05-22 DIAGNOSIS — B351 Tinea unguium: Secondary | ICD-10-CM | POA: Diagnosis not present

## 2019-05-22 DIAGNOSIS — M79676 Pain in unspecified toe(s): Secondary | ICD-10-CM | POA: Diagnosis not present

## 2019-05-22 DIAGNOSIS — E1159 Type 2 diabetes mellitus with other circulatory complications: Secondary | ICD-10-CM

## 2019-05-22 DIAGNOSIS — I739 Peripheral vascular disease, unspecified: Secondary | ICD-10-CM

## 2019-05-22 NOTE — Progress Notes (Signed)
Patient ID: Andrew [name], male   DOB: [birth year], 85 y.o.   MRN: [id number] Complaint:  Visit Type: Patient returns to my office for continued preventative foot care services. Complaint: Patient states" my nails have grown long and thick and become painful to walk and wear shoes" Patient has been diagnosed with DM with no foot complications. The patient presents for preventative foot care services. No changes to ROS.  Patient is on eliquiss and pletal.  Podiatric Exam: Vascular: dorsalis pedis and posterior tibial pulses are palpable bilateral. Capillary return is immediate. Temperature gradient is WNL. Skin turgor WNL  Sensorium: Normal Semmes Weinstein monofilament test. Normal tactile sensation bilaterally. Nail Exam: Pt has thick disfigured discolored nails with subungual debris noted bilateral entire nail hallux through fifth toenails Ulcer Exam: There is no evidence of ulcer or pre-ulcerative changes or infection. Orthopedic Exam: Muscle tone and strength are WNL. No limitations in general ROM. No crepitus or effusions noted. Foot type and digits show no abnormalities. Bony prominences are unremarkable. 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 Skin: No Porokeratosis. No infection or ulcers.  Asymptomatic callus right foot  Diagnosis:  Onychomycosis, , Pain in right toe, pain in left toes  Treatment & Plan Procedures and Treatment: Consent by patient was obtained for treatment procedures. The patient understood the discussion of treatment and procedures well. All questions were answered thoroughly reviewed. Debridement of mycotic and hypertrophic toenails, 1 through 5 bilateral and clearing of subungual debris. No ulceration, no infection noted.  Return Visit-Office Procedure: Patient instructed to return to the office for a follow up visit 10 weeks. for continued evaluation and treatment.   Gardiner Barefoot DPM

## 2019-05-23 ENCOUNTER — Other Ambulatory Visit (HOSPITAL_COMMUNITY): Payer: Self-pay

## 2019-05-23 NOTE — Progress Notes (Signed)
Paramedicine Encounter    Patient ID: Andrew [name], male    DOB: 30-Jan-1928, 85 y.o.   MRN: [id number]   Patient Care Team: [name], MD as PCP - General (Family Medicine) Larey Dresser, MD as PCP - Advanced Heart Failure (Cardiology) Larey Dresser, MD as Consulting Physician (Cardiology) Jorge Ny, LCSW as Social Worker (Licensed Clinical Social Worker)  Patient Active Problem List   Diagnosis Date Noted  . Chronic respiratory failure with hypoxia ([place]) 09/16/2018  . Bilateral recurrent inguinal hernias 11/28/2017  . Calf pain 05/25/2016  . Carotid artery stenosis 01/12/2016  . Chronic systolic CHF (congestive heart failure) ([place]) 12/02/2015  . Cerebellar infarct ([place]) 10/16/2015  . 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 ([place]) 10/16/2015  . PVD (peripheral vascular disease) ([place])   . Hereditary and idiopathic peripheral neuropathy 10/02/2015  . Peripelvic (lymphatic) cyst   . Pernicious anemia 07/15/2015  . Bilateral renal cysts 07/12/2015  . Lung nodule, 35mm RML CT 07/12/15 07/12/2015  . Constipation 07/12/2015  . Kidney stone on right side 07/12/2015  . Stage 3 chronic kidney disease ([place]) 06/14/2015  . Chronic diastolic CHF (congestive heart failure) ([place]) 05/28/2015  . Paroxysmal atrial fibrillation ([place]) 05/28/2015  . AAA (abdominal aortic aneurysm) without rupture ([place]) 09/30/2014  . Left-sided low back pain without sciatica 07/21/2014  . Cardiomyopathy, ischemic 05/23/2014  . DM (diabetes mellitus), type 2 with renal complications ([place]) 82/99/3716  . CORONARY ATHEROSCLEROSIS NATIVE CORONARY ARTERY 11/09/2010  . Depression, major, single episode, complete remission ([place]) 03/18/2010  . Hypothyroidism 02/03/2009  . Osteoarthritis 05/27/2008  . Hyperlipidemia associated with type 2 diabetes mellitus ([place]) 06/14/2007  . Hypertension associated with diabetes ([place]) 06/14/2007  . GERD 06/14/2007    Current Outpatient Medications:  .   amiodarone (PACERONE) 200 MG tablet, Take 0.5 tablets (100 mg total) by mouth daily., Disp: 45 tablet, Rfl: 3 .  atorvastatin (LIPITOR) 80 MG tablet, Take 1 tablet (80 mg total) by mouth daily., Disp: 30 tablet, Rfl: 6 .  carvedilol (COREG) 12.5 MG tablet, TAKE 1 TABLET BY MOUTH TWICE DAILY., Disp: 180 tablet, Rfl: 3 .  cilostazol (PLETAL) 100 MG tablet, TAKE 1 TABLET BY MOUTH TWICE DAILY., Disp: 180 tablet, Rfl: 0 .  ELIQUIS 2.5 MG TABS tablet, TAKE 1 TABLET BY MOUTH TWICE DAILY., Disp: 180 tablet, Rfl: 0 .  ezetimibe (ZETIA) 10 MG tablet, TAKE (1) TABLET DAILY., Disp: 90 tablet, Rfl: 0 .  FLUoxetine (PROZAC) 20 MG capsule, TAKE (1) CAPSULE DAILY., Disp: 90 capsule, Rfl: 0 .  furosemide (LASIX) 80 MG tablet, TAKE 1&1/2 TABLETS IN THE MORNING AND 1 TABLET IN THE AFTERNOON., Disp: 225 tablet, Rfl: 0 .  hydrALAZINE (APRESOLINE) 50 MG tablet, Take 1 tablet (50 mg total) by mouth 3 (three) times daily., Disp: 270 tablet, Rfl: 1 .  isosorbide mononitrate (IMDUR) 30 MG 24 hr tablet, TAKE 1 TABLET EACH DAY., Disp: 90 tablet, Rfl: 0 .  levothyroxine (SYNTHROID, LEVOTHROID) 125 MCG tablet, Take 1 tablet (125 mcg total) by mouth daily before breakfast., Disp: 90 tablet, Rfl: 1 .  Multiple Vitamin (MULTIVITAMIN) tablet, Take 1 tablet by mouth daily., Disp: , Rfl:  .  nitroGLYCERIN (NITROSTAT) 0.4 MG SL tablet, DISSOLVE 1 TABLET UNDER TONGUE AS NEEDED FOR CHEST PAIN,MAY REPEAT IN5 MINUTES FOR 2 DOSES., Disp: 25 tablet, Rfl: 3 .  potassium chloride SA (K-DUR) 20 MEQ tablet, TAKE 1 TABLET ONCE DAILY., Disp: 90 tablet, Rfl: 0 .  SPIRIVA HANDIHALER 18 MCG inhalation capsule,  PLACE 1 CAPSULE INTO INHALER AND INHALE ONCE DAILY., Disp: 90 capsule, Rfl: 0 .  temazepam (RESTORIL) 30 MG capsule, TAKE 1 CAPSULE AT BEDTIME AS NEEDED FOR SLEEP., Disp: 30 capsule, Rfl: 0 .  glucose blood (ACCU-CHEK AVIVA PLUS) test strip, CHECK BLOOD SUGAR 4 TIMES A DAY. (Patient not taking: Reported on 05/23/2019), Disp: 100 each, Rfl: 11 .   HYDROcodone-acetaminophen (NORCO/VICODIN) 5-325 MG tablet, hydrocodone 5 mg-acetaminophen 325 mg tablet, Disp: , Rfl:  .  Influenza Vac A&B Surf Ant Adj (FLUAD) 0.5 ML SUSY, , Disp: , Rfl:  .  Lancets MISC, Check blood sugar 4 times a day, Disp: 100 each, Rfl: 11 .  OXYCODONE HCL PO, oxycodone 5 mg tablet, Disp: , Rfl:  .  Potassium Chloride ER 20 MEQ TBCR, Take 20 mEq by mouth daily., Disp: 30 tablet, Rfl: 3 .  predniSONE (DELTASONE) 10 MG tablet, prednisone 10 mg tablet, Disp: , Rfl:  .  traMADol (ULTRAM) 50 MG tablet, Take 1 tablet (50 mg total) by mouth every 6 (six) hours as needed. (Patient not taking: Reported on 02/21/2019), Disp: 12 tablet, Rfl: 0 .  UNABLE TO FIND, nifedipine ER 30 mg tablet,extended release, Disp: , Rfl:  Allergies  Allergen Reactions  . Metoprolol Shortness Of Breath, Palpitations and Other (See Comments)    Heart starts racing. Shallow breathing; pt states he also get skin irritation on his legs  . Metformin Hives and Swelling    On legs  . Metformin And Related Nausea And Vomiting      Social History   Socioeconomic History  . Marital status: Married    Spouse name: Not on file  . Number of children: Not on file  . Years of education: Not on file  . Highest education level: Not on file  Occupational History  . 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 Financial resource strain: Not on file  . Food insecurity:    Worry: Not on file    Inability: Not on file  . Transportation needs:    Medical: Not on file    Non-medical: Not on file  Tobacco Use  . Smoking status: Former Smoker    Packs/day: 1.50    Years: 35.00    Pack years: 52.50    Types: Cigarettes  . Smokeless tobacco: Never Used  . Tobacco comment: quit atleast 25 yrs ago, per pt  Substance and Sexual Activity  . Alcohol use: Yes    Alcohol/week: 3.0 standard drinks    Types: 3 Shots of liquor per week    Comment: socially  . Drug use: No  . Sexual activity: Yes  Lifestyle  .  Physical activity:    Days per week: Not on file    Minutes per session: Not on file  . Stress: Not on file  Relationships  . Social connections:    Talks on phone: Not on file    Gets together: Not on file    Attends religious service: Not on file    Active member of club or organization: Not on file    Attends meetings of clubs or organizations: Not on file    Relationship status: Not on file  . Intimate partner violence:    Fear of current or ex partner: Not on file    Emotionally abused: Not on file    Physically abused: Not on file    Forced sexual activity: Not on file  Other Topics Concern  . Not on file  Social History Narrative  Pt lives in [place] with spouse.   Retired from [employer].  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    Physical Exam Cardiovascular:     Rate and Rhythm: Normal rate and regular rhythm.     Pulses: Normal pulses.  Pulmonary:     Effort: Pulmonary effort is normal.     Breath sounds: Normal breath sounds.  Musculoskeletal: Normal range of motion.     Right lower leg: Edema present.     Left lower leg: Edema present.  Skin:    General: Skin is warm and dry.     Capillary Refill: Capillary refill takes less than 2 seconds.  Neurological:     Mental Status: He is alert and oriented to person, place, and time.  Psychiatric:        Mood and Affect: Mood normal.         Future Appointments  Date Time Provider [place]  07/30/2019  1:00 PM [facility] ECHO OP 1 [facility]  07/30/2019  2:00 PM Larey Dresser, MD [facility] None  08/27/2019  2:45 PM Gardiner Barefoot, DPM [facility]    BP (!) 113/57 (BP Location: Left Arm, Patient Position: Sitting, Cuff Size: Normal)   Pulse 62   Resp 16   Wt 184 lb (83.5 kg)   SpO2 92%   BMI 28.82 kg/m   Weight yesterday- did not weigh Last visit weight- 184 lb  Mr Weare was seen at home today and reported feeling well. He denied chest  pain, SOB, headache, dizziness, orthopnea, cough or fever over the past week. He saw a specialist regarding his chronic abdominal pain and was put on 100 mg of gabapentin TID. He has been compliant with his medications over the past week and his weight has been stable. His medications were verified and his pillbox was refilled.   [name], EMT 05/23/19  ACTION: Home visit completed Next visit planned for 1 week

## 2019-05-30 ENCOUNTER — Other Ambulatory Visit (HOSPITAL_COMMUNITY): Payer: Self-pay

## 2019-05-30 NOTE — Progress Notes (Signed)
Paramedicine Encounter    Patient ID: Andrew [name], male    DOB: Sep 16, 1928, 85 y.o.   MRN: [id number]   Patient Care Team: [name], MD as PCP - General (Family Medicine) Larey Dresser, MD as PCP - Advanced Heart Failure (Cardiology) Larey Dresser, MD as Consulting Physician (Cardiology) Jorge Ny, LCSW as Social Worker (Licensed Clinical Social Worker)  Patient Active Problem List   Diagnosis Date Noted  . Chronic respiratory failure with hypoxia ([place]) 09/16/2018  . Bilateral recurrent inguinal hernias 11/28/2017  . Calf pain 05/25/2016  . Carotid artery stenosis 01/12/2016  . Chronic systolic CHF (congestive heart failure) ([place]) 12/02/2015  . Cerebellar infarct ([place]) 10/16/2015  . 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 ([place]) 10/16/2015  . PVD (peripheral vascular disease) ([place])   . Hereditary and idiopathic peripheral neuropathy 10/02/2015  . Peripelvic (lymphatic) cyst   . Pernicious anemia 07/15/2015  . Bilateral renal cysts 07/12/2015  . Lung nodule, 52mm RML CT 07/12/15 07/12/2015  . Constipation 07/12/2015  . Kidney stone on right side 07/12/2015  . Stage 3 chronic kidney disease ([place]) 06/14/2015  . Chronic diastolic CHF (congestive heart failure) ([place]) 05/28/2015  . Paroxysmal atrial fibrillation ([place]) 05/28/2015  . AAA (abdominal aortic aneurysm) without rupture ([place]) 09/30/2014  . Left-sided low back pain without sciatica 07/21/2014  . Cardiomyopathy, ischemic 05/23/2014  . DM (diabetes mellitus), type 2 with renal complications ([place]) 51/88/4166  . CORONARY ATHEROSCLEROSIS NATIVE CORONARY ARTERY 11/09/2010  . Depression, major, single episode, complete remission ([place]) 03/18/2010  . Hypothyroidism 02/03/2009  . Osteoarthritis 05/27/2008  . Hyperlipidemia associated with type 2 diabetes mellitus ([place]) 06/14/2007  . Hypertension associated with diabetes ([place]) 06/14/2007  . GERD 06/14/2007    Current Outpatient Medications:  .   amiodarone (PACERONE) 200 MG tablet, Take 0.5 tablets (100 mg total) by mouth daily., Disp: 45 tablet, Rfl: 3 .  atorvastatin (LIPITOR) 80 MG tablet, Take 1 tablet (80 mg total) by mouth daily., Disp: 30 tablet, Rfl: 6 .  carvedilol (COREG) 12.5 MG tablet, TAKE 1 TABLET BY MOUTH TWICE DAILY., Disp: 180 tablet, Rfl: 3 .  cilostazol (PLETAL) 100 MG tablet, TAKE 1 TABLET BY MOUTH TWICE DAILY., Disp: 180 tablet, Rfl: 0 .  ELIQUIS 2.5 MG TABS tablet, TAKE 1 TABLET BY MOUTH TWICE DAILY., Disp: 180 tablet, Rfl: 0 .  ezetimibe (ZETIA) 10 MG tablet, TAKE (1) TABLET DAILY., Disp: 90 tablet, Rfl: 0 .  FLUoxetine (PROZAC) 20 MG capsule, TAKE (1) CAPSULE DAILY., Disp: 90 capsule, Rfl: 0 .  furosemide (LASIX) 80 MG tablet, TAKE 1&1/2 TABLETS IN THE MORNING AND 1 TABLET IN THE AFTERNOON., Disp: 225 tablet, Rfl: 0 .  hydrALAZINE (APRESOLINE) 50 MG tablet, Take 1 tablet (50 mg total) by mouth 3 (three) times daily., Disp: 270 tablet, Rfl: 1 .  HYDROcodone-acetaminophen (NORCO/VICODIN) 5-325 MG tablet, hydrocodone 5 mg-acetaminophen 325 mg tablet, Disp: , Rfl:  .  isosorbide mononitrate (IMDUR) 30 MG 24 hr tablet, TAKE 1 TABLET EACH DAY., Disp: 90 tablet, Rfl: 0 .  levothyroxine (SYNTHROID, LEVOTHROID) 125 MCG tablet, Take 1 tablet (125 mcg total) by mouth daily before breakfast., Disp: 90 tablet, Rfl: 1 .  Multiple Vitamin (MULTIVITAMIN) tablet, Take 1 tablet by mouth daily., Disp: , Rfl:  .  nitroGLYCERIN (NITROSTAT) 0.4 MG SL tablet, DISSOLVE 1 TABLET UNDER TONGUE AS NEEDED FOR CHEST PAIN,MAY REPEAT IN5 MINUTES FOR 2 DOSES., Disp: 25 tablet, Rfl: 3 .  potassium chloride SA (K-DUR) 20 MEQ tablet, TAKE  1 TABLET ONCE DAILY., Disp: 90 tablet, Rfl: 0 .  SPIRIVA HANDIHALER 18 MCG inhalation capsule, PLACE 1 CAPSULE INTO INHALER AND INHALE ONCE DAILY., Disp: 90 capsule, Rfl: 0 .  temazepam (RESTORIL) 30 MG capsule, TAKE 1 CAPSULE AT BEDTIME AS NEEDED FOR SLEEP., Disp: 30 capsule, Rfl: 0 .  glucose blood (ACCU-CHEK AVIVA  PLUS) test strip, CHECK BLOOD SUGAR 4 TIMES A DAY. (Patient not taking: Reported on 05/23/2019), Disp: 100 each, Rfl: 11 .  Influenza Vac A&B Surf Ant Adj (FLUAD) 0.5 ML SUSY, , Disp: , Rfl:  .  Lancets MISC, Check blood sugar 4 times a day, Disp: 100 each, Rfl: 11 .  OXYCODONE HCL PO, oxycodone 5 mg tablet, Disp: , Rfl:  .  Potassium Chloride ER 20 MEQ TBCR, Take 20 mEq by mouth daily., Disp: 30 tablet, Rfl: 3 .  predniSONE (DELTASONE) 10 MG tablet, prednisone 10 mg tablet, Disp: , Rfl:  .  traMADol (ULTRAM) 50 MG tablet, Take 1 tablet (50 mg total) by mouth every 6 (six) hours as needed. (Patient not taking: Reported on 02/21/2019), Disp: 12 tablet, Rfl: 0 .  UNABLE TO FIND, nifedipine ER 30 mg tablet,extended release, Disp: , Rfl:  Allergies  Allergen Reactions  . Metoprolol Shortness Of Breath, Palpitations and Other (See Comments)    Heart starts racing. Shallow breathing; pt states he also get skin irritation on his legs  . Metformin Hives and Swelling    On legs  . Metformin And Related Nausea And Vomiting      Social History   Socioeconomic History  . Marital status: Married    Spouse name: Not on file  . Number of children: Not on file  . Years of education: Not on file  . Highest education level: Not on file  Occupational History  . 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 Financial resource strain: Not on file  . Food insecurity:    Worry: Not on file    Inability: Not on file  . Transportation needs:    Medical: Not on file    Non-medical: Not on file  Tobacco Use  . Smoking status: Former Smoker    Packs/day: 1.50    Years: 35.00    Pack years: 52.50    Types: Cigarettes  . Smokeless tobacco: Never Used  . Tobacco comment: quit atleast 25 yrs ago, per pt  Substance and Sexual Activity  . Alcohol use: Yes    Alcohol/week: 3.0 standard drinks    Types: 3 Shots of liquor per week    Comment: socially  . Drug use: No  . Sexual activity: Yes  Lifestyle  .  Physical activity:    Days per week: Not on file    Minutes per session: Not on file  . Stress: Not on file  Relationships  . Social connections:    Talks on phone: Not on file    Gets together: Not on file    Attends religious service: Not on file    Active member of club or organization: Not on file    Attends meetings of clubs or organizations: Not on file    Relationship status: Not on file  . Intimate partner violence:    Fear of current or ex partner: Not on file    Emotionally abused: Not on file    Physically abused: Not on file    Forced sexual activity: Not on file  Other Topics Concern  . Not on file  Social History Narrative  Pt lives in [place] with spouse.   Retired from [employer].  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    Physical Exam Cardiovascular:     Rate and Rhythm: Normal rate and regular rhythm.     Pulses: Normal pulses.  Pulmonary:     Effort: Pulmonary effort is normal.     Breath sounds: Normal breath sounds.  Musculoskeletal: Normal range of motion.     Right lower leg: Edema present.     Left lower leg: Edema present.  Skin:    General: Skin is warm and dry.     Capillary Refill: Capillary refill takes less than 2 seconds.  Neurological:     Mental Status: He is alert and oriented to person, place, and time.  Psychiatric:        Mood and Affect: Mood normal.         Future Appointments  Date Time Provider [place]  05/31/2019 11:30 AM [facility] 1 [facility] [address] [facility]  07/30/2019  1:00 PM [place] ECHO OP 1 [facility]  07/30/2019  2:00 PM Larey Dresser, MD [facility] None  08/27/2019  2:45 PM Gardiner Barefoot, DPM [facility]    BP (!) 117/55 (BP Location: Left Arm, Patient Position: Sitting, Cuff Size: Normal)   Pulse 70   Resp 18   Wt 188 lb (85.3 kg)   SpO2 92%   BMI 29.44 kg/m   Weight yesterday- 187 lb Last visit weight- 184 lb  Andrew [name] was seen at [facility]  today and reported feeling well. He denied chest pain. SOB, headache, dizziness, orthopnea, cough or fever since our last visit. He stated he has been compliant with his medications over the past week and his weight has been stable. His medications were verified and his pillbox was refilled. I will follow up next week.   [name], EMT 05/30/19  ACTION: Home visit completed Next visit planned for 1 week

## 2019-05-31 ENCOUNTER — Ambulatory Visit
Admission: RE | Admit: 2019-05-31 | Discharge: 2019-05-31 | Disposition: A | Discharge status: Home / Self Care | Payer: Medicare Other | Source: Ambulatory Visit | Attending: Surgery | Admitting: Surgery

## 2019-05-31 ENCOUNTER — Other Ambulatory Visit: Payer: Self-pay

## 2019-05-31 DIAGNOSIS — R102 Pelvic and perineal pain: Secondary | ICD-10-CM

## 2019-06-06 ENCOUNTER — Other Ambulatory Visit (HOSPITAL_COMMUNITY): Payer: Self-pay

## 2019-06-06 MED ORDER — POTASSIUM CHLORIDE CRYS ER 20 MEQ PO TBCR: 20.0000 meq | EXTENDED_RELEASE_TABLET | Freq: Every day | ORAL | 1 refills | Status: DC

## 2019-06-06 NOTE — Progress Notes (Signed)
Paramedicine Encounter    Patient ID: VI WHITESEL, male    DOB: 1928-12-02, 85 y.o.   MRN: [id number]   Patient Care Team: [name], MD as PCP - General (Family Medicine) Larey Dresser, MD as PCP - Advanced Heart Failure (Cardiology) Larey Dresser, MD as Consulting Physician (Cardiology) Jorge Ny, LCSW as Social Worker (Licensed Clinical Social Worker)  Patient Active Problem List   Diagnosis Date Noted  . Chronic respiratory failure with hypoxia ([place]) 09/16/2018  . Bilateral recurrent inguinal hernias 11/28/2017  . Calf pain 05/25/2016  . Carotid artery stenosis 01/12/2016  . Chronic systolic CHF (congestive heart failure) ([place]) 12/02/2015  . Cerebellar infarct ([place]) 10/16/2015  . 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 ([place]) 10/16/2015  . PVD (peripheral vascular disease) ([place])   . Hereditary and idiopathic peripheral neuropathy 10/02/2015  . Peripelvic (lymphatic) cyst   . Pernicious anemia 07/15/2015  . Bilateral renal cysts 07/12/2015  . Lung nodule, 79mm RML CT 07/12/15 07/12/2015  . Constipation 07/12/2015  . Kidney stone on right side 07/12/2015  . Stage 3 chronic kidney disease ([place]) 06/14/2015  . Chronic diastolic CHF (congestive heart failure) ([place]) 05/28/2015  . Paroxysmal atrial fibrillation ([place]) 05/28/2015  . AAA (abdominal aortic aneurysm) without rupture ([place]) 09/30/2014  . Left-sided low back pain without sciatica 07/21/2014  . Cardiomyopathy, ischemic 05/23/2014  . DM (diabetes mellitus), type 2 with renal complications ([place]) 38/46/6599  . CORONARY ATHEROSCLEROSIS NATIVE CORONARY ARTERY 11/09/2010  . Depression, major, single episode, complete remission ([place]) 03/18/2010  . Hypothyroidism 02/03/2009  . Osteoarthritis 05/27/2008  . Hyperlipidemia associated with type 2 diabetes mellitus ([place]) 06/14/2007  . Hypertension associated with diabetes ([place]) 06/14/2007  . GERD 06/14/2007    Current Outpatient Medications:  .   amiodarone (PACERONE) 200 MG tablet, Take 0.5 tablets (100 mg total) by mouth daily., Disp: 45 tablet, Rfl: 3 .  atorvastatin (LIPITOR) 80 MG tablet, Take 1 tablet (80 mg total) by mouth daily., Disp: 30 tablet, Rfl: 6 .  carvedilol (COREG) 12.5 MG tablet, TAKE 1 TABLET BY MOUTH TWICE DAILY., Disp: 180 tablet, Rfl: 3 .  cilostazol (PLETAL) 100 MG tablet, TAKE 1 TABLET BY MOUTH TWICE DAILY., Disp: 180 tablet, Rfl: 0 .  ELIQUIS 2.5 MG TABS tablet, TAKE 1 TABLET BY MOUTH TWICE DAILY., Disp: 180 tablet, Rfl: 0 .  ezetimibe (ZETIA) 10 MG tablet, TAKE (1) TABLET DAILY., Disp: 90 tablet, Rfl: 0 .  FLUoxetine (PROZAC) 20 MG capsule, TAKE (1) CAPSULE DAILY., Disp: 90 capsule, Rfl: 0 .  furosemide (LASIX) 80 MG tablet, TAKE 1&1/2 TABLETS IN THE MORNING AND 1 TABLET IN THE AFTERNOON., Disp: 225 tablet, Rfl: 0 .  hydrALAZINE (APRESOLINE) 50 MG tablet, Take 1 tablet (50 mg total) by mouth 3 (three) times daily., Disp: 270 tablet, Rfl: 1 .  HYDROcodone-acetaminophen (NORCO/VICODIN) 5-325 MG tablet, hydrocodone 5 mg-acetaminophen 325 mg tablet, Disp: , Rfl:  .  isosorbide mononitrate (IMDUR) 30 MG 24 hr tablet, TAKE 1 TABLET EACH DAY., Disp: 90 tablet, Rfl: 0 .  levothyroxine (SYNTHROID, LEVOTHROID) 125 MCG tablet, Take 1 tablet (125 mcg total) by mouth daily before breakfast., Disp: 90 tablet, Rfl: 1 .  Multiple Vitamin (MULTIVITAMIN) tablet, Take 1 tablet by mouth daily., Disp: , Rfl:  .  nitroGLYCERIN (NITROSTAT) 0.4 MG SL tablet, DISSOLVE 1 TABLET UNDER TONGUE AS NEEDED FOR CHEST PAIN,MAY REPEAT IN5 MINUTES FOR 2 DOSES., Disp: 25 tablet, Rfl: 3 .  SPIRIVA HANDIHALER 18 MCG inhalation capsule, PLACE 1  CAPSULE INTO INHALER AND INHALE ONCE DAILY., Disp: 90 capsule, Rfl: 0 .  temazepam (RESTORIL) 30 MG capsule, TAKE 1 CAPSULE AT BEDTIME AS NEEDED FOR SLEEP., Disp: 30 capsule, Rfl: 0 .  glucose blood (ACCU-CHEK AVIVA PLUS) test strip, CHECK BLOOD SUGAR 4 TIMES A DAY. (Patient not taking: Reported on 05/23/2019),  Disp: 100 each, Rfl: 11 .  Influenza Vac A&B Surf Ant Adj (FLUAD) 0.5 ML SUSY, , Disp: , Rfl:  .  Lancets MISC, Check blood sugar 4 times a day, Disp: 100 each, Rfl: 11 .  OXYCODONE HCL PO, oxycodone 5 mg tablet, Disp: , Rfl:  .  Potassium Chloride ER 20 MEQ TBCR, Take 20 mEq by mouth daily., Disp: 30 tablet, Rfl: 3 .  potassium chloride SA (K-DUR) 20 MEQ tablet, Take 1 tablet (20 mEq total) by mouth daily., Disp: 90 tablet, Rfl: 1 .  predniSONE (DELTASONE) 10 MG tablet, prednisone 10 mg tablet, Disp: , Rfl:  .  traMADol (ULTRAM) 50 MG tablet, Take 1 tablet (50 mg total) by mouth every 6 (six) hours as needed. (Patient not taking: Reported on 02/21/2019), Disp: 12 tablet, Rfl: 0 .  UNABLE TO FIND, nifedipine ER 30 mg tablet,extended release, Disp: , Rfl:  Allergies  Allergen Reactions  . Metoprolol Shortness Of Breath, Palpitations and Other (See Comments)    Heart starts racing. Shallow breathing; pt states he also get skin irritation on his legs  . Metformin Hives and Swelling    On legs  . Metformin And Related Nausea And Vomiting      Social History   Socioeconomic History  . Marital status: Married    Spouse name: Not on file  . Number of children: Not on file  . Years of education: Not on file  . Highest education level: Not on file  Occupational History  . 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 Financial resource strain: Not on file  . Food insecurity    Worry: Not on file    Inability: Not on file  . Transportation needs    Medical: Not on file    Non-medical: Not on file  Tobacco Use  . Smoking status: Former Smoker    Packs/day: 1.50    Years: 35.00    Pack years: 52.50    Types: Cigarettes  . Smokeless tobacco: Never Used  . Tobacco comment: quit atleast 25 yrs ago, per pt  Substance and Sexual Activity  . Alcohol use: Yes    Alcohol/week: 3.0 standard drinks    Types: 3 Shots of liquor per week    Comment: socially  . Drug use: No  . Sexual activity: Yes   Lifestyle  . Physical activity    Days per week: Not on file    Minutes per session: Not on file  . Stress: Not on file  Relationships  . Social Herbalist on phone: Not on file    Gets together: Not on file    Attends religious service: Not on file    Active member of club or organization: Not on file    Attends meetings of clubs or organizations: Not on file    Relationship status: Not on file  . Intimate partner violence    Fear of current or ex partner: Not on file    Emotionally abused: Not on file    Physically abused: Not on file    Forced sexual activity: Not on file  Other Topics Concern  . Not on file  Social History Narrative   Pt lives in [place] with spouse.   Retired from [employer].  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    Physical Exam Cardiovascular:     Rate and Rhythm: Normal rate and regular rhythm.     Pulses: Normal pulses.  Pulmonary:     Effort: Pulmonary effort is normal.     Breath sounds: Normal breath sounds.  Musculoskeletal: Normal range of motion.     Right lower leg: No edema.     Left lower leg: No edema.  Skin:    General: Skin is warm and dry.     Capillary Refill: Capillary refill takes less than 2 seconds.  Neurological:     Mental Status: He is alert and oriented to person, place, and time.  Psychiatric:        Mood and Affect: Mood normal.         Future Appointments  Date Time Provider [place]  07/30/2019  1:00 PM [facility] ECHO OP 1 [facility]  07/30/2019  2:00 PM Larey Dresser, MD [facility] None  08/27/2019  2:45 PM Gardiner Barefoot, DPM [facility]    BP 117/60 (BP Location: Left Arm, Patient Position: Sitting, Cuff Size: Normal)   Pulse 61   Resp 16   Wt 188 lb (85.3 kg)   SpO2 91%   BMI 29.44 kg/m   Weight yesterday- 189 lb Last visit weight- 188 lb  Mr Carandang was seen at home today and reported feeling well. He denied chest pain,  SOB, headache, dizziness, orthopnea, cough or fever since our last visit. He stated he has been compliant with his medications over the past week and his weight has been stable. His medications were verified and his pillbox was refilled.   [name], EMT 06/06/19  ACTION: Home visit completed Next visit planned for 1 week

## 2019-06-13 ENCOUNTER — Other Ambulatory Visit (HOSPITAL_COMMUNITY): Payer: Self-pay

## 2019-06-13 ENCOUNTER — Other Ambulatory Visit (HOSPITAL_COMMUNITY): Payer: Self-pay | Admitting: *Deleted

## 2019-06-13 MED ORDER — POTASSIUM CHLORIDE CRYS ER 20 MEQ PO TBCR: 20.0000 meq | EXTENDED_RELEASE_TABLET | Freq: Every day | ORAL | 1 refills | Status: DC

## 2019-06-13 NOTE — Progress Notes (Signed)
Paramedicine Encounter    Patient ID: Andrew [name], male    DOB: [birth year], 85 y.o.   MRN: [id number]   Patient Care Team: [name], MD as PCP - General (Family Medicine) Larey Dresser, MD as PCP - Advanced Heart Failure (Cardiology) Larey Dresser, MD as Consulting Physician (Cardiology) Jorge Ny, LCSW as Social Worker (Licensed Clinical Social Worker)  Patient Active Problem List   Diagnosis Date Noted  . Chronic respiratory failure with hypoxia ([place]) 09/16/2018  . Bilateral recurrent inguinal hernias 11/28/2017  . Calf pain 05/25/2016  . Carotid artery stenosis 01/12/2016  . Chronic systolic CHF (congestive heart failure) ([place]) 12/02/2015  . Cerebellar infarct ([place]) 10/16/2015  . 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 ([place]) 10/16/2015  . PVD (peripheral vascular disease) ([place])   . Hereditary and idiopathic peripheral neuropathy 10/02/2015  . Peripelvic (lymphatic) cyst   . Pernicious anemia 07/15/2015  . Bilateral renal cysts 07/12/2015  . Lung nodule, 35mm RML CT 07/12/15 07/12/2015  . Constipation 07/12/2015  . Kidney stone on right side 07/12/2015  . Stage 3 chronic kidney disease ([place]) 06/14/2015  . Chronic diastolic CHF (congestive heart failure) ([place]) 05/28/2015  . Paroxysmal atrial fibrillation ([place]) 05/28/2015  . AAA (abdominal aortic aneurysm) without rupture ([place]) 09/30/2014  . Left-sided low back pain without sciatica 07/21/2014  . Cardiomyopathy, ischemic 05/23/2014  . DM (diabetes mellitus), type 2 with renal complications ([place]) 83/66/2947  . CORONARY ATHEROSCLEROSIS NATIVE CORONARY ARTERY 11/09/2010  . Depression, major, single episode, complete remission ([place]) 03/18/2010  . Hypothyroidism 02/03/2009  . Osteoarthritis 05/27/2008  . Hyperlipidemia associated with type 2 diabetes mellitus ([place]) 06/14/2007  . Hypertension associated with diabetes ([place]) 06/14/2007  . GERD 06/14/2007    Current Outpatient Medications:  .   amiodarone (PACERONE) 200 MG tablet, Take 0.5 tablets (100 mg total) by mouth daily., Disp: 45 tablet, Rfl: 3 .  atorvastatin (LIPITOR) 80 MG tablet, Take 1 tablet (80 mg total) by mouth daily., Disp: 30 tablet, Rfl: 6 .  carvedilol (COREG) 12.5 MG tablet, TAKE 1 TABLET BY MOUTH TWICE DAILY., Disp: 180 tablet, Rfl: 3 .  cilostazol (PLETAL) 100 MG tablet, TAKE 1 TABLET BY MOUTH TWICE DAILY., Disp: 180 tablet, Rfl: 0 .  ELIQUIS 2.5 MG TABS tablet, TAKE 1 TABLET BY MOUTH TWICE DAILY., Disp: 180 tablet, Rfl: 0 .  ezetimibe (ZETIA) 10 MG tablet, TAKE (1) TABLET DAILY., Disp: 90 tablet, Rfl: 0 .  FLUoxetine (PROZAC) 20 MG capsule, TAKE (1) CAPSULE DAILY., Disp: 90 capsule, Rfl: 0 .  furosemide (LASIX) 80 MG tablet, TAKE 1&1/2 TABLETS IN THE MORNING AND 1 TABLET IN THE AFTERNOON., Disp: 225 tablet, Rfl: 0 .  gabapentin (NEURONTIN) 100 MG capsule, Take 100 mg by mouth 3 (three) times daily., Disp: , Rfl:  .  hydrALAZINE (APRESOLINE) 50 MG tablet, Take 1 tablet (50 mg total) by mouth 3 (three) times daily., Disp: 270 tablet, Rfl: 1 .  HYDROcodone-acetaminophen (NORCO/VICODIN) 5-325 MG tablet, hydrocodone 5 mg-acetaminophen 325 mg tablet, Disp: , Rfl:  .  isosorbide mononitrate (IMDUR) 30 MG 24 hr tablet, TAKE 1 TABLET EACH DAY., Disp: 90 tablet, Rfl: 0 .  levothyroxine (SYNTHROID, LEVOTHROID) 125 MCG tablet, Take 1 tablet (125 mcg total) by mouth daily before breakfast., Disp: 90 tablet, Rfl: 1 .  Multiple Vitamin (MULTIVITAMIN) tablet, Take 1 tablet by mouth daily., Disp: , Rfl:  .  SPIRIVA HANDIHALER 18 MCG inhalation capsule, PLACE 1 CAPSULE INTO INHALER AND INHALE ONCE DAILY., Disp: 90  capsule, Rfl: 0 .  glucose blood (ACCU-CHEK AVIVA PLUS) test strip, CHECK BLOOD SUGAR 4 TIMES A DAY. (Patient not taking: Reported on 05/23/2019), Disp: 100 each, Rfl: 11 .  Influenza Vac A&B Surf Ant Adj (FLUAD) 0.5 ML SUSY, , Disp: , Rfl:  .  Lancets MISC, Check blood sugar 4 times a day (Patient not taking: Reported on  06/13/2019), Disp: 100 each, Rfl: 11 .  nitroGLYCERIN (NITROSTAT) 0.4 MG SL tablet, DISSOLVE 1 TABLET UNDER TONGUE AS NEEDED FOR CHEST PAIN,MAY REPEAT IN5 MINUTES FOR 2 DOSES. (Patient not taking: Reported on 06/13/2019), Disp: 25 tablet, Rfl: 3 .  OXYCODONE HCL PO, oxycodone 5 mg tablet, Disp: , Rfl:  .  Potassium Chloride ER 20 MEQ TBCR, Take 20 mEq by mouth daily., Disp: 30 tablet, Rfl: 3 .  potassium chloride SA (K-DUR) 20 MEQ tablet, Take 1 tablet (20 mEq total) by mouth daily., Disp: 90 tablet, Rfl: 1 .  predniSONE (DELTASONE) 10 MG tablet, prednisone 10 mg tablet, Disp: , Rfl:  .  temazepam (RESTORIL) 30 MG capsule, TAKE 1 CAPSULE AT BEDTIME AS NEEDED FOR SLEEP. (Patient not taking: Reported on 06/13/2019), Disp: 30 capsule, Rfl: 0 .  traMADol (ULTRAM) 50 MG tablet, Take 1 tablet (50 mg total) by mouth every 6 (six) hours as needed. (Patient not taking: Reported on 02/21/2019), Disp: 12 tablet, Rfl: 0 Allergies  Allergen Reactions  . Metoprolol Shortness Of Breath, Palpitations and Other (See Comments)    Heart starts racing. Shallow breathing; pt states he also get skin irritation on his legs  . Metformin Hives and Swelling    On legs  . Metformin And Related Nausea And Vomiting      Social History   Socioeconomic History  . Marital status: Married    Spouse name: Not on file  . Number of children: Not on file  . Years of education: Not on file  . Highest education level: Not on file  Occupational History  . 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 Financial resource strain: Not on file  . Food insecurity    Worry: Not on file    Inability: Not on file  . Transportation needs    Medical: Not on file    Non-medical: Not on file  Tobacco Use  . Smoking status: Former Smoker    Packs/day: 1.50    Years: 35.00    Pack years: 52.50    Types: Cigarettes  . Smokeless tobacco: Never Used  . Tobacco comment: quit atleast 25 yrs ago, per pt  Substance and Sexual Activity  .  Alcohol use: Yes    Alcohol/week: 3.0 standard drinks    Types: 3 Shots of liquor per week    Comment: socially  . Drug use: No  . Sexual activity: Yes  Lifestyle  . Physical activity    Days per week: Not on file    Minutes per session: Not on file  . Stress: Not on file  Relationships  . Social Herbalist on phone: Not on file    Gets together: Not on file    Attends religious service: Not on file    Active member of club or organization: Not on file    Attends meetings of clubs or organizations: Not on file    Relationship status: Not on file  . Intimate partner violence    Fear of current or ex partner: Not on file    Emotionally abused: Not on file  Physically abused: Not on file    Forced sexual activity: Not on file  Other Topics Concern  . Not on file  Social History Narrative   Pt lives in [place] with spouse.   Retired from [employer].  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    Physical Exam      Future Appointments  Date Time Provider [place]  07/30/2019  1:00 PM [facility] ECHO OP 1 [facility]  07/30/2019  2:00 PM Larey Dresser, MD [facility] None  08/27/2019  2:45 PM Gardiner Barefoot, DPM [facility]    BP 128/68   Pulse 76   Temp (!) 97.5 F (36.4 C)   Resp 16   Wt 186 lb (84.4 kg)   SpO2 93%   BMI 29.13 kg/m   Weight yesterday-186 Last visit weight-188  Pt reports he is doing well, he states he has been having abd pains and he is going to be evaluated tomor for it. He denies c/p, no increased sob. He missed 1 dose of am pills, 1 dose of afternoon and 1 dose of evening meds this week---(not in the same day) meds verified and pill box refilled.  Ordered potassium for refill.   Marylouise Stacks, [facility] Paramedic  06/13/19

## 2019-06-19 ENCOUNTER — Telehealth (HOSPITAL_COMMUNITY): Payer: Self-pay

## 2019-06-19 NOTE — Telephone Encounter (Signed)
Left voicemail for [place] to return my call for appointment for tomorrow.

## 2019-06-20 ENCOUNTER — Other Ambulatory Visit (HOSPITAL_COMMUNITY): Payer: Self-pay

## 2019-06-20 ENCOUNTER — Telehealth (HOSPITAL_COMMUNITY): Payer: Self-pay

## 2019-06-20 NOTE — Telephone Encounter (Signed)
Spoke to [place] confirming appointment for 4 o clock

## 2019-06-20 NOTE — Progress Notes (Signed)
Paramedicine Encounter    Patient ID: Andrew [name], male    DOB: 1928-12-16, 85 y.o.   MRN: [id number]   Patient Care Team: [name], MD as PCP - General (Family Medicine) Larey Dresser, MD as PCP - Advanced Heart Failure (Cardiology) Larey Dresser, MD as Consulting Physician (Cardiology) Jorge Ny, LCSW as Social Worker (Licensed Clinical Social Worker)  Patient Active Problem List   Diagnosis Date Noted  . Chronic respiratory failure with hypoxia ([place]) 09/16/2018  . Bilateral recurrent inguinal hernias 11/28/2017  . Calf pain 05/25/2016  . Carotid artery stenosis 01/12/2016  . Chronic systolic CHF (congestive heart failure) ([place]) 12/02/2015  . Cerebellar infarct ([name]) 10/16/2015  . 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 ([place]) 10/16/2015  . PVD (peripheral vascular disease) ([place])   . Hereditary and idiopathic peripheral neuropathy 10/02/2015  . Peripelvic (lymphatic) cyst   . Pernicious anemia 07/15/2015  . Bilateral renal cysts 07/12/2015  . Lung nodule, 31mm RML CT 07/12/15 07/12/2015  . Constipation 07/12/2015  . Kidney stone on right side 07/12/2015  . Stage 3 chronic kidney disease ([place]) 06/14/2015  . Chronic diastolic CHF (congestive heart failure) ([place]) 05/28/2015  . Paroxysmal atrial fibrillation ([place]) 05/28/2015  . AAA (abdominal aortic aneurysm) without rupture ([place]) 09/30/2014  . Left-sided low back pain without sciatica 07/21/2014  . Cardiomyopathy, ischemic 05/23/2014  . DM (diabetes mellitus), type 2 with renal complications ([place]) 97/98/9211  . CORONARY ATHEROSCLEROSIS NATIVE CORONARY ARTERY 11/09/2010  . Depression, major, single episode, complete remission ([place]) 03/18/2010  . Hypothyroidism 02/03/2009  . Osteoarthritis 05/27/2008  . Hyperlipidemia associated with type 2 diabetes mellitus ([place]) 06/14/2007  . Hypertension associated with diabetes ([place]) 06/14/2007  . GERD 06/14/2007    Current Outpatient Medications:  .   amiodarone (PACERONE) 200 MG tablet, Take 0.5 tablets (100 mg total) by mouth daily., Disp: 45 tablet, Rfl: 3 .  atorvastatin (LIPITOR) 80 MG tablet, Take 1 tablet (80 mg total) by mouth daily., Disp: 30 tablet, Rfl: 6 .  carvedilol (COREG) 12.5 MG tablet, TAKE 1 TABLET BY MOUTH TWICE DAILY., Disp: 180 tablet, Rfl: 3 .  cilostazol (PLETAL) 100 MG tablet, TAKE 1 TABLET BY MOUTH TWICE DAILY., Disp: 180 tablet, Rfl: 0 .  ELIQUIS 2.5 MG TABS tablet, TAKE 1 TABLET BY MOUTH TWICE DAILY., Disp: 180 tablet, Rfl: 0 .  ezetimibe (ZETIA) 10 MG tablet, TAKE (1) TABLET DAILY., Disp: 90 tablet, Rfl: 0 .  FLUoxetine (PROZAC) 20 MG capsule, TAKE (1) CAPSULE DAILY., Disp: 90 capsule, Rfl: 0 .  furosemide (LASIX) 80 MG tablet, TAKE 1&1/2 TABLETS IN THE MORNING AND 1 TABLET IN THE AFTERNOON., Disp: 225 tablet, Rfl: 0 .  gabapentin (NEURONTIN) 100 MG capsule, Take 100 mg by mouth 3 (three) times daily., Disp: , Rfl:  .  hydrALAZINE (APRESOLINE) 50 MG tablet, Take 1 tablet (50 mg total) by mouth 3 (three) times daily., Disp: 270 tablet, Rfl: 1 .  isosorbide mononitrate (IMDUR) 30 MG 24 hr tablet, TAKE 1 TABLET EACH DAY., Disp: 90 tablet, Rfl: 0 .  levothyroxine (SYNTHROID, LEVOTHROID) 125 MCG tablet, Take 1 tablet (125 mcg total) by mouth daily before breakfast., Disp: 90 tablet, Rfl: 1 .  Multiple Vitamin (MULTIVITAMIN) tablet, Take 1 tablet by mouth daily., Disp: , Rfl:  .  potassium chloride SA (K-DUR) 20 MEQ tablet, Take 1 tablet (20 mEq total) by mouth daily., Disp: 90 tablet, Rfl: 1 .  SPIRIVA HANDIHALER 18 MCG inhalation capsule, PLACE 1 CAPSULE INTO INHALER  AND INHALE ONCE DAILY., Disp: 90 capsule, Rfl: 0 .  glucose blood (ACCU-CHEK AVIVA PLUS) test strip, CHECK BLOOD SUGAR 4 TIMES A DAY. (Patient not taking: Reported on 05/23/2019), Disp: 100 each, Rfl: 11 .  HYDROcodone-acetaminophen (NORCO/VICODIN) 5-325 MG tablet, hydrocodone 5 mg-acetaminophen 325 mg tablet, Disp: , Rfl:  .  Influenza Vac A&B Surf Ant Adj  (FLUAD) 0.5 ML SUSY, , Disp: , Rfl:  .  Lancets MISC, Check blood sugar 4 times a day (Patient not taking: Reported on 06/13/2019), Disp: 100 each, Rfl: 11 .  nitroGLYCERIN (NITROSTAT) 0.4 MG SL tablet, DISSOLVE 1 TABLET UNDER TONGUE AS NEEDED FOR CHEST PAIN,MAY REPEAT IN5 MINUTES FOR 2 DOSES. (Patient not taking: Reported on 06/13/2019), Disp: 25 tablet, Rfl: 3 .  OXYCODONE HCL PO, oxycodone 5 mg tablet, Disp: , Rfl:  .  Potassium Chloride ER 20 MEQ TBCR, Take 20 mEq by mouth daily., Disp: 30 tablet, Rfl: 3 .  predniSONE (DELTASONE) 10 MG tablet, prednisone 10 mg tablet, Disp: , Rfl:  .  temazepam (RESTORIL) 30 MG capsule, TAKE 1 CAPSULE AT BEDTIME AS NEEDED FOR SLEEP. (Patient not taking: Reported on 06/13/2019), Disp: 30 capsule, Rfl: 0 .  traMADol (ULTRAM) 50 MG tablet, Take 1 tablet (50 mg total) by mouth every 6 (six) hours as needed. (Patient not taking: Reported on 02/21/2019), Disp: 12 tablet, Rfl: 0 Allergies  Allergen Reactions  . Metoprolol Shortness Of Breath, Palpitations and Other (See Comments)    Heart starts racing. Shallow breathing; pt states he also get skin irritation on his legs  . Metformin Hives and Swelling    On legs  . Metformin And Related Nausea And Vomiting      Social History   Socioeconomic History  . Marital status: Married    Spouse name: Not on file  . Number of children: Not on file  . Years of education: Not on file  . Highest education level: Not on file  Occupational History  . 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 Financial resource strain: Not on file  . Food insecurity    Worry: Not on file    Inability: Not on file  . Transportation needs    Medical: Not on file    Non-medical: Not on file  Tobacco Use  . Smoking status: Former Smoker    Packs/day: 1.50    Years: 35.00    Pack years: 52.50    Types: Cigarettes  . Smokeless tobacco: Never Used  . Tobacco comment: quit atleast 25 yrs ago, per pt  Substance and Sexual Activity  .  Alcohol use: Yes    Alcohol/week: 3.0 standard drinks    Types: 3 Shots of liquor per week    Comment: socially  . Drug use: No  . Sexual activity: Yes  Lifestyle  . Physical activity    Days per week: Not on file    Minutes per session: Not on file  . Stress: Not on file  Relationships  . Social Herbalist on phone: Not on file    Gets together: Not on file    Attends religious service: Not on file    Active member of club or organization: Not on file    Attends meetings of clubs or organizations: Not on file    Relationship status: Not on file  . Intimate partner violence    Fear of current or ex partner: Not on file    Emotionally abused: Not on file  Physically abused: Not on file    Forced sexual activity: Not on file  Other Topics Concern  . Not on file  Social History Narrative   Pt lives in [place] with spouse.   Retired from [employer].  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    Physical Exam      Future Appointments  Date Time Provider [place]  07/30/2019  1:00 PM [facility] ECHO OP 1 [facility]  07/30/2019  2:00 PM Larey Dresser, MD [facility] None  08/27/2019  2:45 PM Gardiner Barefoot, DPM [facility]    BP 114/60   Pulse 80   Temp (!) 97.2 F (36.2 C)   Resp 18   Wt 186 lb (84.4 kg)   SpO2 98%   BMI 29.13 kg/m   Weight yesterday-186 Last visit weight-186  Pt states he is doing well, he denies increased sob. He denies dizziness, no c/p. No complaints noted. No edema noted. His wife reports that they are starting to go work out in the pool and do water exercises and walking.  meds verified and pill box refilled. No missed doses this week.  Called in refill of carvedilol.   Marylouise Stacks, [facility] Paramedic  06/20/19

## 2019-06-27 ENCOUNTER — Other Ambulatory Visit (HOSPITAL_COMMUNITY): Payer: Self-pay

## 2019-06-27 NOTE — Progress Notes (Signed)
Paramedicine Encounter    Patient ID: Andrew [name], male    DOB: 1928/07/07, 85 y.o.   MRN: [id number]   Patient Care Team: [name], MD as PCP - General (Family Medicine) Larey Dresser, MD as PCP - Advanced Heart Failure (Cardiology) Larey Dresser, MD as Consulting Physician (Cardiology) Jorge Ny, LCSW as Social Worker (Licensed Clinical Social Worker)  Patient Active Problem List   Diagnosis Date Noted  . Chronic respiratory failure with hypoxia ([place]) 09/16/2018  . Bilateral recurrent inguinal hernias 11/28/2017  . Calf pain 05/25/2016  . Carotid artery stenosis 01/12/2016  . Chronic systolic CHF (congestive heart failure) ([place]) 12/02/2015  . Cerebellar infarct ([place]) 10/16/2015  . 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 ([place]) 10/16/2015  . PVD (peripheral vascular disease) ([place])   . Hereditary and idiopathic peripheral neuropathy 10/02/2015  . Peripelvic (lymphatic) cyst   . Pernicious anemia 07/15/2015  . Bilateral renal cysts 07/12/2015  . Lung nodule, 40mm RML CT 07/12/15 07/12/2015  . Constipation 07/12/2015  . Kidney stone on right side 07/12/2015  . Stage 3 chronic kidney disease ([place]) 06/14/2015  . Chronic diastolic CHF (congestive heart failure) ([place]) 05/28/2015  . Paroxysmal atrial fibrillation ([place]) 05/28/2015  . AAA (abdominal aortic aneurysm) without rupture ([place]) 09/30/2014  . Left-sided low back pain without sciatica 07/21/2014  . Cardiomyopathy, ischemic 05/23/2014  . DM (diabetes mellitus), type 2 with renal complications ([place]) 28/36/6294  . CORONARY ATHEROSCLEROSIS NATIVE CORONARY ARTERY 11/09/2010  . Depression, major, single episode, complete remission ([place]) 03/18/2010  . Hypothyroidism 02/03/2009  . Osteoarthritis 05/27/2008  . Hyperlipidemia associated with type 2 diabetes mellitus ([place]) 06/14/2007  . Hypertension associated with diabetes ([place]) 06/14/2007  . GERD 06/14/2007    Current Outpatient Medications:  .   amiodarone (PACERONE) 200 MG tablet, Take 0.5 tablets (100 mg total) by mouth daily., Disp: 45 tablet, Rfl: 3 .  atorvastatin (LIPITOR) 80 MG tablet, Take 1 tablet (80 mg total) by mouth daily., Disp: 30 tablet, Rfl: 6 .  carvedilol (COREG) 12.5 MG tablet, TAKE 1 TABLET BY MOUTH TWICE DAILY., Disp: 180 tablet, Rfl: 3 .  cilostazol (PLETAL) 100 MG tablet, TAKE 1 TABLET BY MOUTH TWICE DAILY., Disp: 180 tablet, Rfl: 0 .  ELIQUIS 2.5 MG TABS tablet, TAKE 1 TABLET BY MOUTH TWICE DAILY., Disp: 180 tablet, Rfl: 0 .  ezetimibe (ZETIA) 10 MG tablet, TAKE (1) TABLET DAILY., Disp: 90 tablet, Rfl: 0 .  FLUoxetine (PROZAC) 20 MG capsule, TAKE (1) CAPSULE DAILY., Disp: 90 capsule, Rfl: 0 .  furosemide (LASIX) 80 MG tablet, TAKE 1&1/2 TABLETS IN THE MORNING AND 1 TABLET IN THE AFTERNOON., Disp: 225 tablet, Rfl: 0 .  gabapentin (NEURONTIN) 100 MG capsule, Take 100 mg by mouth 3 (three) times daily., Disp: , Rfl:  .  hydrALAZINE (APRESOLINE) 50 MG tablet, Take 1 tablet (50 mg total) by mouth 3 (three) times daily., Disp: 270 tablet, Rfl: 1 .  isosorbide mononitrate (IMDUR) 30 MG 24 hr tablet, TAKE 1 TABLET EACH DAY., Disp: 90 tablet, Rfl: 0 .  levothyroxine (SYNTHROID, LEVOTHROID) 125 MCG tablet, Take 1 tablet (125 mcg total) by mouth daily before breakfast., Disp: 90 tablet, Rfl: 1 .  Multiple Vitamin (MULTIVITAMIN) tablet, Take 1 tablet by mouth daily., Disp: , Rfl:  .  potassium chloride SA (K-DUR) 20 MEQ tablet, Take 1 tablet (20 mEq total) by mouth daily., Disp: 90 tablet, Rfl: 1 .  SPIRIVA HANDIHALER 18 MCG inhalation capsule, PLACE 1 CAPSULE INTO INHALER  AND INHALE ONCE DAILY., Disp: 90 capsule, Rfl: 0 .  temazepam (RESTORIL) 30 MG capsule, TAKE 1 CAPSULE AT BEDTIME AS NEEDED FOR SLEEP., Disp: 30 capsule, Rfl: 0 .  glucose blood (ACCU-CHEK AVIVA PLUS) test strip, CHECK BLOOD SUGAR 4 TIMES A DAY. (Patient not taking: Reported on 05/23/2019), Disp: 100 each, Rfl: 11 .  HYDROcodone-acetaminophen (NORCO/VICODIN)  5-325 MG tablet, hydrocodone 5 mg-acetaminophen 325 mg tablet, Disp: , Rfl:  .  Influenza Vac A&B Surf Ant Adj (FLUAD) 0.5 ML SUSY, , Disp: , Rfl:  .  Lancets MISC, Check blood sugar 4 times a day (Patient not taking: Reported on 06/13/2019), Disp: 100 each, Rfl: 11 .  nitroGLYCERIN (NITROSTAT) 0.4 MG SL tablet, DISSOLVE 1 TABLET UNDER TONGUE AS NEEDED FOR CHEST PAIN,MAY REPEAT IN5 MINUTES FOR 2 DOSES. (Patient not taking: Reported on 06/13/2019), Disp: 25 tablet, Rfl: 3 .  OXYCODONE HCL PO, oxycodone 5 mg tablet, Disp: , Rfl:  .  Potassium Chloride ER 20 MEQ TBCR, Take 20 mEq by mouth daily., Disp: 30 tablet, Rfl: 3 .  predniSONE (DELTASONE) 10 MG tablet, prednisone 10 mg tablet, Disp: , Rfl:  .  traMADol (ULTRAM) 50 MG tablet, Take 1 tablet (50 mg total) by mouth every 6 (six) hours as needed. (Patient not taking: Reported on 02/21/2019), Disp: 12 tablet, Rfl: 0 Allergies  Allergen Reactions  . Metoprolol Shortness Of Breath, Palpitations and Other (See Comments)    Heart starts racing. Shallow breathing; pt states he also get skin irritation on his legs  . Metformin Hives and Swelling    On legs  . Metformin And Related Nausea And Vomiting      Social History   Socioeconomic History  . Marital status: Married    Spouse name: Not on file  . Number of children: Not on file  . Years of education: Not on file  . Highest education level: Not on file  Occupational History  . 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 Financial resource strain: Not on file  . Food insecurity    Worry: Not on file    Inability: Not on file  . Transportation needs    Medical: Not on file    Non-medical: Not on file  Tobacco Use  . Smoking status: Former Smoker    Packs/day: 1.50    Years: 35.00    Pack years: 52.50    Types: Cigarettes  . Smokeless tobacco: Never Used  . Tobacco comment: quit atleast 25 yrs ago, per pt  Substance and Sexual Activity  . Alcohol use: Yes    Alcohol/week: 3.0 standard  drinks    Types: 3 Shots of liquor per week    Comment: socially  . Drug use: No  . Sexual activity: Yes  Lifestyle  . Physical activity    Days per week: Not on file    Minutes per session: Not on file  . Stress: Not on file  Relationships  . Social Herbalist on phone: Not on file    Gets together: Not on file    Attends religious service: Not on file    Active member of club or organization: Not on file    Attends meetings of clubs or organizations: Not on file    Relationship status: Not on file  . Intimate partner violence    Fear of current or ex partner: Not on file    Emotionally abused: Not on file    Physically abused: Not on file  Forced sexual activity: Not on file  Other Topics Concern  . Not on file  Social History Narrative   Pt lives in [place] with spouse.   Retired from [employer].  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    Physical Exam Cardiovascular:     Rate and Rhythm: Normal rate and regular rhythm.     Pulses: Normal pulses.  Pulmonary:     Effort: Pulmonary effort is normal.     Breath sounds: Normal breath sounds.  Musculoskeletal: Normal range of motion.     Right lower leg: No edema.     Left lower leg: No edema.  Skin:    General: Skin is warm and dry.     Capillary Refill: Capillary refill takes less than 2 seconds.  Neurological:     Mental Status: He is alert and oriented to person, place, and time.         Future Appointments  Date Time Provider [place]  07/30/2019  1:00 PM [facility] ECHO OP 1 [facility]  07/30/2019  2:00 PM Larey Dresser, MD [facility] None  08/27/2019  2:45 PM Gardiner Barefoot, DPM [facility]    BP (!) 110/58 (BP Location: Left Arm, Patient Position: Sitting, Cuff Size: Normal)   Pulse 73   Resp 16   Wt 186 lb (84.4 kg)   SpO2 93%   BMI 29.13 kg/m   Weight yesterday- Did not weigh Last visit weight- 186 lb  Mr Schunk was  seen at home today and reported feeling well. He denied chest pain, SOB, headache, dizziness, orthopnea, cough or fever since our last visit. He stated he has been compliant with his medications and his weight has been generally stable, although he does not weigh daily. He did say he had a fall last week and struck his head but refused to seek evaluation at the hospital. Per Ms Wilmeth, Dr [name] was consulted via telephone and advised they hold Eliquis for two days. They advised the bruising and swelling had decreased since last week and appeared to be healing well. He denied any issues with visual disturbances or dizziness since the fall but advised he would seek evaluation if any such symptoms occur. No other issues were noted today. His medications were verified ad his pillbox was refilled. He ran out of carvedilol while I was filling his box however the refill had already been called in and Ms Faughnan stated she would pick it up tomorrow. I marked the box to denote where the carvedilol should be placed when it is picked up. She was understanding and agreeable. I will follow up next week.   [name], EMT 06/27/19  ACTION: Home visit completed Next visit planned for 1 week

## 2019-07-02 ENCOUNTER — Encounter (HOSPITAL_COMMUNITY): Payer: Self-pay | Admitting: Emergency Medicine

## 2019-07-02 ENCOUNTER — Inpatient Hospital Stay (HOSPITAL_COMMUNITY)
Admission: EM | Admit: 2019-07-02 | Discharge: 2019-07-04 | DRG: 189 | Disposition: A | Discharge status: Home Health | Payer: Medicare Other | Attending: Student | Admitting: Student

## 2019-07-02 ENCOUNTER — Telehealth (HOSPITAL_COMMUNITY): Payer: Self-pay

## 2019-07-02 ENCOUNTER — Other Ambulatory Visit: Payer: Self-pay

## 2019-07-02 ENCOUNTER — Inpatient Hospital Stay (HOSPITAL_COMMUNITY): Payer: Medicare Other

## 2019-07-02 ENCOUNTER — Emergency Department (HOSPITAL_COMMUNITY): Payer: Medicare Other

## 2019-07-02 DIAGNOSIS — M545 Low back pain: Secondary | ICD-10-CM | POA: Diagnosis not present

## 2019-07-02 DIAGNOSIS — Z7951 Long term (current) use of inhaled steroids: Secondary | ICD-10-CM | POA: Diagnosis not present

## 2019-07-02 DIAGNOSIS — Z951 Presence of aortocoronary bypass graft: Secondary | ICD-10-CM | POA: Diagnosis not present

## 2019-07-02 DIAGNOSIS — R6510 Systemic inflammatory response syndrome (SIRS) of non-infectious origin without acute organ dysfunction: Secondary | ICD-10-CM | POA: Diagnosis present

## 2019-07-02 DIAGNOSIS — E1165 Type 2 diabetes mellitus with hyperglycemia: Secondary | ICD-10-CM | POA: Diagnosis present

## 2019-07-02 DIAGNOSIS — E785 Hyperlipidemia, unspecified: Secondary | ICD-10-CM | POA: Diagnosis present

## 2019-07-02 DIAGNOSIS — Z87891 Personal history of nicotine dependence: Secondary | ICD-10-CM | POA: Diagnosis not present

## 2019-07-02 DIAGNOSIS — Z79890 Hormone replacement therapy (postmenopausal): Secondary | ICD-10-CM

## 2019-07-02 DIAGNOSIS — Z7952 Long term (current) use of systemic steroids: Secondary | ICD-10-CM

## 2019-07-02 DIAGNOSIS — Z955 Presence of coronary angioplasty implant and graft: Secondary | ICD-10-CM | POA: Diagnosis not present

## 2019-07-02 DIAGNOSIS — E1122 Type 2 diabetes mellitus with diabetic chronic kidney disease: Secondary | ICD-10-CM | POA: Diagnosis present

## 2019-07-02 DIAGNOSIS — F329 Major depressive disorder, single episode, unspecified: Secondary | ICD-10-CM | POA: Diagnosis present

## 2019-07-02 DIAGNOSIS — N400 Enlarged prostate without lower urinary tract symptoms: Secondary | ICD-10-CM | POA: Diagnosis present

## 2019-07-02 DIAGNOSIS — R0602 Shortness of breath: Secondary | ICD-10-CM | POA: Diagnosis not present

## 2019-07-02 DIAGNOSIS — W19XXXA Unspecified fall, initial encounter: Secondary | ICD-10-CM | POA: Diagnosis not present

## 2019-07-02 DIAGNOSIS — Z79899 Other long term (current) drug therapy: Secondary | ICD-10-CM | POA: Diagnosis not present

## 2019-07-02 DIAGNOSIS — J441 Chronic obstructive pulmonary disease with (acute) exacerbation: Secondary | ICD-10-CM | POA: Diagnosis present

## 2019-07-02 DIAGNOSIS — J9601 Acute respiratory failure with hypoxia: Principal | ICD-10-CM

## 2019-07-02 DIAGNOSIS — I5032 Chronic diastolic (congestive) heart failure: Secondary | ICD-10-CM | POA: Diagnosis not present

## 2019-07-02 DIAGNOSIS — Z9181 History of falling: Secondary | ICD-10-CM

## 2019-07-02 DIAGNOSIS — Z79891 Long term (current) use of opiate analgesic: Secondary | ICD-10-CM | POA: Diagnosis not present

## 2019-07-02 DIAGNOSIS — K760 Fatty (change of) liver, not elsewhere classified: Secondary | ICD-10-CM | POA: Diagnosis present

## 2019-07-02 DIAGNOSIS — N184 Chronic kidney disease, stage 4 (severe): Secondary | ICD-10-CM | POA: Diagnosis present

## 2019-07-02 DIAGNOSIS — M549 Dorsalgia, unspecified: Secondary | ICD-10-CM

## 2019-07-02 DIAGNOSIS — I2510 Atherosclerotic heart disease of native coronary artery without angina pectoris: Secondary | ICD-10-CM | POA: Diagnosis present

## 2019-07-02 DIAGNOSIS — Z7901 Long term (current) use of anticoagulants: Secondary | ICD-10-CM | POA: Diagnosis not present

## 2019-07-02 DIAGNOSIS — E039 Hypothyroidism, unspecified: Secondary | ICD-10-CM | POA: Diagnosis present

## 2019-07-02 DIAGNOSIS — Z85820 Personal history of malignant melanoma of skin: Secondary | ICD-10-CM | POA: Diagnosis not present

## 2019-07-02 DIAGNOSIS — I130 Hypertensive heart and chronic kidney disease with heart failure and stage 1 through stage 4 chronic kidney disease, or unspecified chronic kidney disease: Secondary | ICD-10-CM | POA: Diagnosis present

## 2019-07-02 DIAGNOSIS — Y92009 Unspecified place in unspecified non-institutional (private) residence as the place of occurrence of the external cause: Secondary | ICD-10-CM

## 2019-07-02 DIAGNOSIS — J9600 Acute respiratory failure, unspecified whether with hypoxia or hypercapnia: Secondary | ICD-10-CM | POA: Diagnosis present

## 2019-07-02 DIAGNOSIS — Z1159 Encounter for screening for other viral diseases: Secondary | ICD-10-CM

## 2019-07-02 DIAGNOSIS — I4821 Permanent atrial fibrillation: Secondary | ICD-10-CM | POA: Diagnosis not present

## 2019-07-02 DIAGNOSIS — M4316 Spondylolisthesis, lumbar region: Secondary | ICD-10-CM | POA: Diagnosis present

## 2019-07-02 DIAGNOSIS — I480 Paroxysmal atrial fibrillation: Secondary | ICD-10-CM | POA: Diagnosis present

## 2019-07-02 DIAGNOSIS — K219 Gastro-esophageal reflux disease without esophagitis: Secondary | ICD-10-CM | POA: Diagnosis present

## 2019-07-02 DIAGNOSIS — M47816 Spondylosis without myelopathy or radiculopathy, lumbar region: Secondary | ICD-10-CM | POA: Diagnosis present

## 2019-07-02 DIAGNOSIS — R0902 Hypoxemia: Secondary | ICD-10-CM | POA: Diagnosis not present

## 2019-07-02 DIAGNOSIS — D72825 Bandemia: Secondary | ICD-10-CM | POA: Diagnosis not present

## 2019-07-02 LAB — LACTIC ACID, PLASMA
Lactic Acid, Venous: 1.4 mmol/L (ref 0.5–1.9)
Lactic Acid, Venous: 1.2 mmol/L (ref 0.5–1.9)

## 2019-07-02 LAB — CBC WITH DIFFERENTIAL/PLATELET
Abs Immature Granulocytes: 0.08 10*3/uL — ABNORMAL HIGH (ref 0.00–0.07)
Basophils Absolute: 0 10*3/uL (ref 0.0–0.1)
Basophils Relative: 0 %
Eosinophils Absolute: 0.2 10*3/uL (ref 0.0–0.5)
Eosinophils Relative: 1 %
HCT: 40.9 % (ref 39.0–52.0)
Hemoglobin: 13.3 g/dL (ref 13.0–17.0)
Immature Granulocytes: 1 %
Lymphocytes Relative: 6 %
Lymphs Abs: 0.8 10*3/uL (ref 0.7–4.0)
MCH: 32.3 pg (ref 26.0–34.0)
MCHC: 32.5 g/dL (ref 30.0–36.0)
MCV: 99.3 fL (ref 80.0–100.0)
Monocytes Absolute: 1.1 10*3/uL — ABNORMAL HIGH (ref 0.1–1.0)
Monocytes Relative: 8 %
Neutro Abs: 11.3 10*3/uL — ABNORMAL HIGH (ref 1.7–7.7)
Neutrophils Relative %: 84 %
Platelets: 168 10*3/uL (ref 150–400)
RBC: 4.12 MIL/uL — ABNORMAL LOW (ref 4.22–5.81)
RDW: 12.7 % (ref 11.5–15.5)
WBC: 13.5 10*3/uL — ABNORMAL HIGH (ref 4.0–10.5)
nRBC: 0 % (ref 0.0–0.2)

## 2019-07-02 LAB — COMPREHENSIVE METABOLIC PANEL
ALT: 31 U/L (ref 0–44)
AST: 32 U/L (ref 15–41)
Albumin: 4 g/dL (ref 3.5–5.0)
Alkaline Phosphatase: 70 U/L (ref 38–126)
Anion gap: 14 (ref 5–15)
BUN: 34 mg/dL — ABNORMAL HIGH (ref 8–23)
CO2: 27 mmol/L (ref 22–32)
Calcium: 9.4 mg/dL (ref 8.9–10.3)
Chloride: 101 mmol/L (ref 98–111)
Creatinine, Ser: 1.76 mg/dL — ABNORMAL HIGH (ref 0.61–1.24)
GFR calc Af Amer: 39 mL/min — ABNORMAL LOW (ref >60)
GFR calc non Af Amer: 34 mL/min — ABNORMAL LOW (ref >60)
Glucose, Bld: 151 mg/dL — ABNORMAL HIGH (ref 70–99)
Potassium: 3.6 mmol/L (ref 3.5–5.1)
Sodium: 142 mmol/L (ref 135–145)
Total Bilirubin: 1 mg/dL (ref 0.3–1.2)
Total Protein: 7.1 g/dL (ref 6.5–8.1)

## 2019-07-02 LAB — URINALYSIS, ROUTINE W REFLEX MICROSCOPIC
Bacteria, UA: NONE SEEN
Bilirubin Urine: NEGATIVE
Glucose, UA: NEGATIVE mg/dL
Ketones, ur: NEGATIVE mg/dL
Leukocytes,Ua: NEGATIVE
Nitrite: NEGATIVE
Protein, ur: 100 mg/dL — AB
Specific Gravity, Urine: 1.009 (ref 1.005–1.030)
pH: 7 (ref 5.0–8.0)

## 2019-07-02 LAB — GLUCOSE, CAPILLARY
Glucose-Capillary: 182 mg/dL — ABNORMAL HIGH (ref 70–99)
Glucose-Capillary: 254 mg/dL — ABNORMAL HIGH (ref 70–99)
Glucose-Capillary: 366 mg/dL — ABNORMAL HIGH (ref 70–99)

## 2019-07-02 LAB — PROCALCITONIN: Procalcitonin: <0.1 ng/mL

## 2019-07-02 LAB — SARS CORONAVIRUS 2 BY RT PCR (HOSPITAL ORDER, PERFORMED IN ~~LOC~~ HOSPITAL LAB): SARS Coronavirus 2: NEGATIVE

## 2019-07-02 LAB — TSH: TSH: 1.528 u[IU]/mL (ref 0.350–4.500)

## 2019-07-02 LAB — STREP PNEUMONIAE URINARY ANTIGEN: Strep Pneumo Urinary Antigen: NEGATIVE

## 2019-07-02 LAB — HEMOGLOBIN A1C
Hgb A1c MFr Bld: 7.3 % — ABNORMAL HIGH (ref 4.8–5.6)
Mean Plasma Glucose: 162.81 mg/dL

## 2019-07-02 LAB — BRAIN NATRIURETIC PEPTIDE: B Natriuretic Peptide: 393.5 pg/mL — ABNORMAL HIGH (ref 0.0–100.0)

## 2019-07-02 LAB — CBG MONITORING, ED: Glucose-Capillary: 147 mg/dL — ABNORMAL HIGH (ref 70–99)

## 2019-07-02 MED ORDER — FUROSEMIDE 20 MG PO TABS: 80.0000 mg | ORAL_TABLET | Freq: Two times a day (BID) | ORAL | Status: DC

## 2019-07-02 MED ORDER — METHYLPREDNISOLONE SODIUM SUCC 125 MG IJ SOLR
125.0000 mg | Freq: Once | INTRAMUSCULAR | Status: AC
  Administered 2019-07-02: 125 mg via INTRAVENOUS
  Filled 2019-07-02: qty 2

## 2019-07-02 MED ORDER — ATORVASTATIN CALCIUM 80 MG PO TABS
80.0000 mg | ORAL_TABLET | Freq: Every day | ORAL | Status: DC
  Administered 2019-07-02 – 2019-07-03 (×2): 80 mg via ORAL
  Filled 2019-07-02 (×2): qty 1

## 2019-07-02 MED ORDER — ALBUTEROL SULFATE (2.5 MG/3ML) 0.083% IN NEBU: 2.5000 mg | INHALATION_SOLUTION | RESPIRATORY_TRACT | Status: DC | PRN

## 2019-07-02 MED ORDER — CARVEDILOL 12.5 MG PO TABS
12.5000 mg | ORAL_TABLET | Freq: Two times a day (BID) | ORAL | Status: DC
  Administered 2019-07-02 – 2019-07-04 (×5): 12.5 mg via ORAL
  Filled 2019-07-02 (×5): qty 1

## 2019-07-02 MED ORDER — SODIUM CHLORIDE 0.9 % IV SOLN
2.0000 g | INTRAVENOUS | Status: DC
  Administered 2019-07-02 – 2019-07-04 (×3): 2 g via INTRAVENOUS
  Filled 2019-07-02 (×4): qty 20

## 2019-07-02 MED ORDER — ALBUTEROL SULFATE (2.5 MG/3ML) 0.083% IN NEBU
2.5000 mg | INHALATION_SOLUTION | Freq: Two times a day (BID) | RESPIRATORY_TRACT | Status: DC
  Administered 2019-07-02: 2.5 mg via RESPIRATORY_TRACT
  Filled 2019-07-02: qty 3

## 2019-07-02 MED ORDER — INSULIN ASPART 100 UNIT/ML ~~LOC~~ SOLN
0.0000 [IU] | Freq: Every day | SUBCUTANEOUS | Status: DC
  Administered 2019-07-02: 5 [IU] via SUBCUTANEOUS

## 2019-07-02 MED ORDER — ACETAMINOPHEN 325 MG PO TABS: 650.0000 mg | ORAL_TABLET | Freq: Four times a day (QID) | ORAL | Status: DC | PRN

## 2019-07-02 MED ORDER — SODIUM CHLORIDE 0.9 % IV SOLN
500.0000 mg | INTRAVENOUS | Status: DC
  Administered 2019-07-02 – 2019-07-04 (×3): 500 mg via INTRAVENOUS
  Filled 2019-07-02 (×5): qty 500

## 2019-07-02 MED ORDER — UMECLIDINIUM BROMIDE 62.5 MCG/INH IN AEPB
1.0000 | INHALATION_SPRAY | Freq: Every day | RESPIRATORY_TRACT | Status: DC
  Administered 2019-07-03 – 2019-07-04 (×2): 1 via RESPIRATORY_TRACT
  Filled 2019-07-02: qty 7

## 2019-07-02 MED ORDER — TEMAZEPAM 15 MG PO CAPS
30.0000 mg | ORAL_CAPSULE | Freq: Every evening | ORAL | Status: DC | PRN
  Administered 2019-07-03: 30 mg via ORAL
  Filled 2019-07-02: qty 2

## 2019-07-02 MED ORDER — HYDROCODONE-ACETAMINOPHEN 5-325 MG PO TABS: 1.0000 | ORAL_TABLET | Freq: Every day | ORAL | Status: DC | PRN

## 2019-07-02 MED ORDER — GABAPENTIN 100 MG PO CAPS
100.0000 mg | ORAL_CAPSULE | Freq: Three times a day (TID) | ORAL | Status: DC
  Administered 2019-07-02 – 2019-07-04 (×6): 100 mg via ORAL
  Filled 2019-07-02 (×6): qty 1

## 2019-07-02 MED ORDER — FUROSEMIDE 80 MG PO TABS
80.0000 mg | ORAL_TABLET | Freq: Every day | ORAL | Status: DC
  Administered 2019-07-03: 80 mg via ORAL
  Filled 2019-07-02: qty 1

## 2019-07-02 MED ORDER — TIOTROPIUM BROMIDE MONOHYDRATE 18 MCG IN CAPS: 18.0000 ug | ORAL_CAPSULE | Freq: Every day | RESPIRATORY_TRACT | Status: DC

## 2019-07-02 MED ORDER — FLUOXETINE HCL 20 MG PO CAPS
20.0000 mg | ORAL_CAPSULE | Freq: Every day | ORAL | Status: DC
  Administered 2019-07-02 – 2019-07-04 (×3): 20 mg via ORAL
  Filled 2019-07-02 (×3): qty 1

## 2019-07-02 MED ORDER — MORPHINE SULFATE (PF) 2 MG/ML IV SOLN
2.0000 mg | INTRAVENOUS | Status: AC
  Administered 2019-07-02: 2 mg via INTRAVENOUS
  Filled 2019-07-02: qty 1

## 2019-07-02 MED ORDER — LEVOTHYROXINE SODIUM 25 MCG PO TABS
125.0000 ug | ORAL_TABLET | Freq: Every day | ORAL | Status: DC
  Administered 2019-07-03 – 2019-07-04 (×2): 125 ug via ORAL
  Filled 2019-07-02 (×3): qty 1

## 2019-07-02 MED ORDER — GUAIFENESIN ER 600 MG PO TB12
600.0000 mg | ORAL_TABLET | Freq: Two times a day (BID) | ORAL | Status: DC
  Administered 2019-07-02 – 2019-07-04 (×5): 600 mg via ORAL
  Filled 2019-07-02 (×5): qty 1

## 2019-07-02 MED ORDER — FUROSEMIDE 80 MG PO TABS
120.0000 mg | ORAL_TABLET | Freq: Every day | ORAL | Status: DC
  Administered 2019-07-02 – 2019-07-04 (×3): 120 mg via ORAL
  Filled 2019-07-02 (×3): qty 1

## 2019-07-02 MED ORDER — EZETIMIBE 10 MG PO TABS
10.0000 mg | ORAL_TABLET | Freq: Every day | ORAL | Status: DC
  Administered 2019-07-02 – 2019-07-04 (×3): 10 mg via ORAL
  Filled 2019-07-02 (×3): qty 1

## 2019-07-02 MED ORDER — POTASSIUM CHLORIDE ER 20 MEQ PO TBCR: 20.0000 meq | EXTENDED_RELEASE_TABLET | Freq: Every day | ORAL | Status: DC

## 2019-07-02 MED ORDER — HYDRALAZINE HCL 50 MG PO TABS
50.0000 mg | ORAL_TABLET | Freq: Three times a day (TID) | ORAL | Status: DC
  Administered 2019-07-02 – 2019-07-04 (×6): 50 mg via ORAL
  Filled 2019-07-02 (×6): qty 2

## 2019-07-02 MED ORDER — POTASSIUM CHLORIDE CRYS ER 20 MEQ PO TBCR
20.0000 meq | EXTENDED_RELEASE_TABLET | Freq: Every day | ORAL | Status: DC
  Administered 2019-07-02 – 2019-07-04 (×3): 20 meq via ORAL
  Filled 2019-07-02 (×3): qty 1

## 2019-07-02 MED ORDER — ONDANSETRON HCL 4 MG/2ML IJ SOLN: 4.0000 mg | Freq: Four times a day (QID) | INTRAMUSCULAR | Status: DC | PRN

## 2019-07-02 MED ORDER — CILOSTAZOL 100 MG PO TABS
100.0000 mg | ORAL_TABLET | Freq: Two times a day (BID) | ORAL | Status: DC
  Administered 2019-07-02 – 2019-07-04 (×5): 100 mg via ORAL
  Filled 2019-07-02 (×6): qty 1

## 2019-07-02 MED ORDER — ONDANSETRON HCL 4 MG PO TABS: 4.0000 mg | ORAL_TABLET | Freq: Four times a day (QID) | ORAL | Status: DC | PRN

## 2019-07-02 MED ORDER — ACETAMINOPHEN 650 MG RE SUPP: 650.0000 mg | Freq: Four times a day (QID) | RECTAL | Status: DC | PRN

## 2019-07-02 MED ORDER — APIXABAN 2.5 MG PO TABS
2.5000 mg | ORAL_TABLET | Freq: Two times a day (BID) | ORAL | Status: DC
  Administered 2019-07-02 – 2019-07-04 (×5): 2.5 mg via ORAL
  Filled 2019-07-02 (×5): qty 1

## 2019-07-02 MED ORDER — AMIODARONE HCL 200 MG PO TABS
100.0000 mg | ORAL_TABLET | Freq: Every day | ORAL | Status: DC
  Administered 2019-07-02 – 2019-07-04 (×3): 100 mg via ORAL
  Filled 2019-07-02 (×3): qty 1

## 2019-07-02 MED ORDER — INSULIN ASPART 100 UNIT/ML ~~LOC~~ SOLN
0.0000 [IU] | Freq: Three times a day (TID) | SUBCUTANEOUS | Status: DC
  Administered 2019-07-03 (×2): 3 [IU] via SUBCUTANEOUS

## 2019-07-02 MED ORDER — ISOSORBIDE MONONITRATE ER 30 MG PO TB24
30.0000 mg | ORAL_TABLET | Freq: Every day | ORAL | Status: DC
  Administered 2019-07-02 – 2019-07-04 (×3): 30 mg via ORAL
  Filled 2019-07-02 (×3): qty 1

## 2019-07-02 NOTE — ED Notes (Addendum)
Patient was assisted to the edge of the bed for ambulation, during this process patient was short of breath and oxygen saturation dropped to 85%. Patient was placed back on 4lpm via [place]. Patient also admitted to having severe back pain that was inhibiting him from getting to a standing position to ambulate.

## 2019-07-02 NOTE — ED Notes (Signed)
ED TO INPATIENT HANDOFF REPORT  ED Nurse Name and Phone #: Arby Barrette RN [phone]  S Name/Age/Gender Andrew [name] 85 y.o. male Room/Bed: 039C/039C  Code Status   Code Status: Full Code  Home/SNF/Other Home Patient oriented to: self, place and time & situation  Is this baseline? Yes   Triage Complete: Triage complete  Chief Complaint Fall   Triage Note Pt arrives to ED via [facility] EMS s/p seizure like activity at home.  [name] heard a loud noise and found him face down "shaking" per spouse.  Pt does not recall the incident.    Pt is on Eliquis with a hematoma noted to the right eye.  Pt has a fall on 06/24/19 and was not evaluated.  EMS reports pt's abd is distended, taught and tender. Pt complains of right flank pain and has right chest bruising.     Allergies Allergies  Allergen Reactions  . Metoprolol Shortness Of Breath, Palpitations and Other (See Comments)    Heart starts racing. Shallow breathing; pt states he also get skin irritation on his legs  . Metformin Hives and Swelling    On legs  . Metformin And Related Nausea And Vomiting    Level of Care/Admitting Diagnosis ED Disposition    ED Disposition Condition [facility] Area: [place] [[id number]]  Level of Care: Telemetry Medical [104]  Covid Evaluation: Asymptomatic Screening Protocol (No Symptoms)  Diagnosis: Acute respiratory failure ([place]) [518.81.ICD-9-CM]  Admitting Physician: Norval Morton [[id number]]  Attending Physician: Norval Morton [[id number]]  Estimated length of stay: past midnight tomorrow  Certification:: I certify this patient will need inpatient services for at least 2 midnights  PT Class (Do Not Modify): Inpatient [101]  PT Acc Code (Do Not Modify): Private [1]       B Medical/Surgery History Past Medical History:  Diagnosis Date  . AAA (abdominal aortic aneurysm) ([place])   . Anemia   . Arthritis   . CAD (coronary artery disease)   . Cancer ([place])     Melanoma - Back  . Carotid artery stenosis   . CHF (congestive heart failure) ([place])   . Cyst of kidney, acquired   . Depression   . Diabetes mellitus   . DVT (deep venous thrombosis) ([place])   . Dysrhythmia   . Fatty liver 2008  . GERD (gastroesophageal reflux disease)   . Hyperlipidemia   . Hypertension   . IBS (irritable bowel syndrome)   . LVF (left ventricular failure) ([place])   . Paroxysmal atrial fibrillation ([facility])   . Pneumonia Jan. 2014  . Shortness of breath dyspnea   . Thyroid disease   . Typical atrial flutter ([place])   . UTI (urinary tract infection) 05/2015   While in [place]   . Vitamin D deficiency    Past Surgical History:  Procedure Laterality Date  . ABDOMINAL AORTIC ANEURYSM REPAIR  2002  . CARDIAC CATHETERIZATION    . CORONARY ANGIOPLASTY    . CORONARY ANGIOPLASTY WITH STENT PLACEMENT  2005  . CORONARY ARTERY BYPASS GRAFT  1992  . EYE SURGERY     [place]  . HERNIA REPAIR    . INGUINAL HERNIA REPAIR Bilateral    w/mesh  . INSERTION OF MESH N/A 11/28/2017   Procedure: INSERTION OF MESH;  Surgeon: Donnie Mesa, MD;  Location: [place];  Service: General;  Laterality: N/A;  GENERAL AND TAP BLOCK  . KNEE SURGERY Bilateral   . LAPAROSCOPIC INGUINAL HERNIA WITH  UMBILICAL HERNIA Bilateral 11/28/2017   Procedure: LAPAROSCOPIC BILATERAL INGUINAL HERNIA REPAIR WITH MESH, UMBILICAL HERNIA REPAIR WITH MESH;  Surgeon: Donnie Mesa, MD;  Location: [place];  Service: General;  Laterality: Bilateral;  GENERAL AND TAP BLOCK  . LEFT HEART CATHETERIZATION WITH CORONARY ANGIOGRAM N/A 10/30/2014   Procedure: LEFT HEART CATHETERIZATION WITH CORONARY ANGIOGRAM;  Surgeon: Larey Dresser, MD;  Location[facility] CATH LAB;  Service: Cardiovascular;  Laterality: N/A;  . QUADRICEPS TENDON REPAIR Bilateral 08/2003   Archie Endo 05/10/2011  . UMBILICAL HERNIA REPAIR  11/28/2017   w/mesh     A IV Location/Drains/Wounds Patient Lines/Drains/Airways Status   Active Line/Drains/Airways    Name:    Placement date:   Placement time:   Site:   Days:   Peripheral IV 07/02/19 Right Antecubital   07/02/19    0300    Antecubital   less than 1   Incision (Closed) 11/28/17 Abdomen Other (Comment)   11/28/17    0903     581   Incision - 3 Ports Abdomen 1: Umbilicus 2: Medial 3: Distal   11/28/17    0802     581   Wound / Incision (Open or Dehisced) 10/17/15 Other (Comment) Leg Right;Left healing cellulitis   10/17/15    0048    Leg   1354          Intake/Output Last 24 hours No intake or output data in the 24 hours ending 07/02/19 0851  Labs/Imaging Results for orders placed or performed during the hospital encounter of 07/02/19 (from the past 48 hour(s))  CBG monitoring, ED     Status: Abnormal   Collection Time: 07/02/19  2:50 AM  Result Value Ref Range   Glucose-Capillary 147 (H) 70 - 99 mg/dL  Lactic acid, plasma     Status: None   Collection Time: 07/02/19  2:51 AM  Result Value Ref Range   Lactic Acid, Venous 1.4 0.5 - 1.9 mmol/L    Comment: Performed at [facility], [place] [address] [place], [place] [address]  Comprehensive metabolic panel     Status: Abnormal   Collection Time: 07/02/19  2:51 AM  Result Value Ref Range   Sodium 142 135 - 145 mmol/L   Potassium 3.6 3.5 - 5.1 mmol/L   Chloride 101 98 - 111 mmol/L   CO2 27 22 - 32 mmol/L   Glucose, Bld 151 (H) 70 - 99 mg/dL   BUN 34 (H) 8 - 23 mg/dL   Creatinine, Ser 1.76 (H) 0.61 - 1.24 mg/dL   Calcium 9.4 8.9 - 10.3 mg/dL   Total Protein 7.1 6.5 - 8.1 g/dL   Albumin 4.0 3.5 - 5.0 g/dL   AST 32 15 - 41 U/L   ALT 31 0 - 44 U/L   Alkaline Phosphatase 70 38 - 126 U/L   Total Bilirubin 1.0 0.3 - 1.2 mg/dL   GFR calc non Af Amer 34 (L) >60 mL/min   GFR calc Af Amer 39 (L) >60 mL/min   Anion gap 14 5 - 15    Comment: Performed at [facility] [address] [place], [place] [address]  CBC WITH DIFFERENTIAL     Status: Abnormal   Collection Time: 07/02/19  2:51 AM  Result Value Ref Range   WBC 13.5 (H) 4.0 -  10.5 K/uL   RBC 4.12 (L) 4.22 - 5.81 MIL/uL   Hemoglobin 13.3 13.0 - 17.0 g/dL   HCT 40.9 39.0 - 52.0 %  MCV 99.3 80.0 - 100.0 fL   MCH 32.3 26.0 - 34.0 pg   MCHC 32.5 30.0 - 36.0 g/dL   RDW 12.7 11.5 - 15.5 %   Platelets 168 150 - 400 K/uL   nRBC 0.0 0.0 - 0.2 %   Neutrophils Relative % 84 %   Neutro Abs 11.3 (H) 1.7 - 7.7 K/uL   Lymphocytes Relative 6 %   Lymphs Abs 0.8 0.7 - 4.0 K/uL   Monocytes Relative 8 %   Monocytes Absolute 1.1 (H) 0.1 - 1.0 K/uL   Eosinophils Relative 1 %   Eosinophils Absolute 0.2 0.0 - 0.5 K/uL   Basophils Relative 0 %   Basophils Absolute 0.0 0.0 - 0.1 K/uL   Immature Granulocytes 1 %   Abs Immature Granulocytes 0.08 (H) 0.00 - 0.07 K/uL    Comment: Performed at [facility] [address] [place], [place] [address]  Brain natriuretic peptide     Status: Abnormal   Collection Time: 07/02/19  2:51 AM  Result Value Ref Range   B Natriuretic Peptide 393.5 (H) 0.0 - 100.0 pg/mL    Comment: Performed at [facility] [address], [place], [place] [address]  Urinalysis, Routine w reflex microscopic     Status: Abnormal   Collection Time: 07/02/19  2:59 AM  Result Value Ref Range   Color, Urine STRAW (A) YELLOW   APPearance CLEAR CLEAR   Specific Gravity, Urine 1.009 1.005 - 1.030   pH 7.0 5.0 - 8.0   Glucose, UA NEGATIVE NEGATIVE mg/dL   Hgb urine dipstick SMALL (A) NEGATIVE   Bilirubin Urine NEGATIVE NEGATIVE   Ketones, ur NEGATIVE NEGATIVE mg/dL   Protein, ur 100 (A) NEGATIVE mg/dL   Nitrite NEGATIVE NEGATIVE   Leukocytes,Ua NEGATIVE NEGATIVE   RBC / HPF 0-5 0 - 5 RBC/hpf   WBC, UA 0-5 0 - 5 WBC/hpf   Bacteria, UA NONE SEEN NONE SEEN    Comment: Performed at [facility] [address], [place], [place] [address]  SARS Coronavirus 2 (CEPHEID- Performed in [facility] hospital lab), Hosp Order     Status: None   Collection Time: 07/02/19  3:17 AM   Specimen: Nasopharyngeal Swab  Result Value Ref Range   SARS  Coronavirus 2 NEGATIVE NEGATIVE    Comment: (NOTE) If result is NEGATIVE SARS-CoV-2 target nucleic acids are NOT DETECTED. The SARS-CoV-2 RNA is generally detectable in upper and lower  respiratory specimens during the acute phase of infection. The lowest  concentration of SARS-CoV-2 viral copies this assay can detect is 250  copies / mL. A negative result does not preclude SARS-CoV-2 infection  and should not be used as the sole basis for treatment or other  patient management decisions.  A negative result may occur with  improper specimen collection / handling, submission of specimen other  than nasopharyngeal swab, presence of viral mutation(s) within the  areas targeted by this assay, and inadequate number of viral copies  (<250 copies / mL). A negative result must be combined with clinical  observations, patient history, and epidemiological information. If result is POSITIVE SARS-CoV-2 target nucleic acids are DETECTED. The SARS-CoV-2 RNA is generally detectable in upper and lower  respiratory specimens dur ing the acute phase of infection.  Positive  results are indicative of active infection with SARS-CoV-2.  Clinical  correlation with patient history and other diagnostic information is  necessary to determine patient infection status.  Positive results do  not rule out bacterial infection or co-infection with other viruses. If result is PRESUMPTIVE POSTIVE SARS-CoV-2 nucleic acids MAY BE PRESENT.   A presumptive positive result was obtained on the submitted specimen  and confirmed on repeat testing.  While 2019 novel coronavirus  (SARS-CoV-2) nucleic acids may be present in the submitted sample  additional confirmatory testing may be necessary for epidemiological  and / or clinical management purposes  to differentiate between  SARS-CoV-2 and other Sarbecovirus currently known to infect humans.  If clinically indicated additional testing with an alternate test  methodology  [phone]) is advised. The SARS-CoV-2 RNA is generally  detectable in upper and lower respiratory sp ecimens during the acute  phase of infection. The expected result is Negative. Fact Sheet for Patients:  StrictlyIdeas.no Fact Sheet for Healthcare Providers: BankingDealers.co.za This test is not yet approved or cleared by the [place] FDA and has been authorized for detection and/or diagnosis of SARS-CoV-2 by FDA under an Emergency Use Authorization (EUA).  This EUA will remain in effect (meaning this test can be used) for the duration of the COVID-19 declaration under Section 564(b)(1) of the Act, 21 U.S.C. section 360bbb-3(b)(1), unless the authorization is terminated or revoked sooner. Performed at [facility], [place] [address], [place], [place] [address]   Lactic acid, plasma     Status: None   Collection Time: 07/02/19  4:16 AM  Result Value Ref Range   Lactic Acid, Venous 1.2 0.5 - 1.9 mmol/L    Comment: Performed at [facility] [address], [place], [place] [address]   Ct Head Wo Contrast  Result Date: 07/02/2019 CLINICAL DATA:  Head trauma secondary to seizure like activity at home. Soft tissue swelling over the right orbit. EXAM: CT HEAD WITHOUT CONTRAST TECHNIQUE: Contiguous axial images were obtained from the base of the skull through the vertex without intravenous contrast. COMPARISON:  CT scan dated 03/22/2016 FINDINGS: Brain: No acute hemorrhage. No mass lesion. Diffuse stable atrophy with secondary ventricular dilatation. Periventricular white matter lucency is consistent with chronic small vessel ischemic change, stable. Vascular: No hyperdense vessel or unexpected calcification. Skull: Normal. Negative for fracture or focal lesion. Sinuses/Orbits: Normal. Other: Slight soft tissue swelling adjacent to the right superior orbital rim consistent with soft tissue contusion. IMPRESSION: No acute intracranial  abnormality. Soft tissue contusion over the right orbit. Electronically Signed   By: Lorriane Shire M.D.   On: 07/02/2019 05:14   Dg Chest Port 1 View  Result Date: 07/02/2019 CLINICAL DATA:  Shortness of breath. EXAM: PORTABLE CHEST 1 VIEW COMPARISON:  05/06/2019 FINDINGS: Post median sternotomy. Unchanged heart size and mediastinal contours with aortic atherosclerosis and tortuosity. Chronic elevation of right hemidiaphragm. No pulmonary edema. No pleural fluid or pneumothorax. Mild left lung base atelectasis, no confluent airspace disease. IMPRESSION: 1. Mild left lung base atelectasis. 2.  Aortic Atherosclerosis (ICD10-I70.0). Electronically Signed   By: Keith Rake M.D.   On: 07/02/2019 03:31    Pending Labs Unresulted Labs (From admission, onward)    Start     Ordered   07/03/19 0500  CBC  Tomorrow morning,   R     07/02/19 0808   07/03/19 0737  Basic metabolic panel  Tomorrow morning,   R     07/02/19 0808   07/03/19 0500  Procalcitonin  Daily,   R     07/02/19 0808   07/02/19 0816  TSH  Add-on,   AD     07/02/19 1062  07/02/19 0809  Procalcitonin - Baseline  Add-on,   AD     07/02/19 0808   07/02/19 0807  Culture, sputum-assessment  Once,   R     07/02/19 0808   07/02/19 0807  Legionella Pneumophila Serogp 1 Ur Ag  Once,   STAT     07/02/19 0808   07/02/19 0807  Strep pneumoniae urinary antigen  Once,   STAT     07/02/19 0808   07/02/19 0245  Blood Culture (routine x 2)  BLOOD CULTURE X 2,   STAT     07/02/19 0247          Vitals/Pain Today's Vitals   07/02/19 0700 07/02/19 0800 07/02/19 0830 07/02/19 0846  BP: 138/74 (!) 145/63 (!) 169/85 (!) 158/81  Pulse:    81  Resp:      Temp:      TempSrc:      SpO2:    99%    Isolation Precautions Droplet and Contact precautions  Medications Medications  cefTRIAXone (ROCEPHIN) 2 g in sodium chloride 0.9 % 100 mL IVPB (0 g Intravenous Stopped 07/02/19 0343)  azithromycin (ZITHROMAX) 500 mg in sodium chloride 0.9 %  250 mL IVPB (0 mg Intravenous Stopped 07/02/19 0528)  acetaminophen (TYLENOL) tablet 650 mg (has no administration in time range)    Or  acetaminophen (TYLENOL) suppository 650 mg (has no administration in time range)  ondansetron (ZOFRAN) tablet 4 mg (has no administration in time range)    Or  ondansetron (ZOFRAN) injection 4 mg (has no administration in time range)  albuterol (PROVENTIL) (2.5 MG/3ML) 0.083% nebulizer solution 2.5 mg (has no administration in time range)  guaiFENesin (MUCINEX) 12 hr tablet 600 mg (has no administration in time range)  morphine 2 MG/ML injection 2 mg (has no administration in time range)  methylPREDNISolone sodium succinate (SOLU-MEDROL) 125 mg/2 mL injection 125 mg (has no administration in time range)  albuterol (PROVENTIL) (2.5 MG/3ML) 0.083% nebulizer solution 2.5 mg (has no administration in time range)    Mobility walks High fall risk   Focused Assessments Cardiac Assessment Handoff:  Cardiac Rhythm: Atrial fibrillation Lab Results  Component Value Date   CKTOTAL 131 12/11/2008   CKMB 4.9 (H) 12/11/2008   TROPONINI 0.05 10/28/2014   Lab Results  Component Value Date   DDIMER 1.57 (H) 05/25/2016   Does the Patient currently have chest pain? No  , Neuro Assessment Handoff:  Cardiac Rhythm: Atrial fibrillation       Neuro Assessment:   Neuro Checks:      Last Documented NIHSS Modified Score:   Has TPA been given? No If patient is a Neuro Trauma and patient is going to OR before floor call report to [place] nurse: [phone] or [phone]     R Recommendations: See Admitting Provider Note  Report given to: 2W01  Additional Notes:

## 2019-07-02 NOTE — ED Notes (Signed)
Spoke with floor about pt's precautions, MD being contacted to verify before moving pt upstairs.

## 2019-07-02 NOTE — Telephone Encounter (Signed)
I received a call from Ms Carruthers stating that Andrew [name] had fallen last night and was being admitted to the hospital. I advised that I would make the HF clinic aware and asked that she keep me informed of his progress as she knows more. She was understanding and agreeable.

## 2019-07-02 NOTE — Progress Notes (Signed)
Dr.Schorr notified of new order for neb tx and unable to do neb tx on this side r/t Covid

## 2019-07-02 NOTE — H&P (Signed)
History and Physical    Andrew [name] TWK:[id number] DOB: December 20, 1928 DOA: 07/02/2019  Referring MD/NP/PA: Thayer Jew, MD PCP: [name], MD  Patient coming from: Home via EMS  Chief Complaint: Chills  I have personally briefly reviewed patient's old medical records in [place]   HPI: Andrew [name] is a 85 y.o. male with medical history significant of HTN, HLD, diastolic CHF, CAD, PAF on Eliquis, DM type II, DVT, hypothyroidism, remote history of tobacco use, and AAA s/p repair; who presented with complaints of chills and back pain.  History is obtained with talks with the patient and his wife over the phone.  His wife notes that she found him this morning after hearing a loud noise and found him in his recliner shaking and everything around him on the floor as if he had fallen.  He states that he never lost consciousness and had very bad chills.  Apparently, symptoms started in the last 2 days after he had fallen onto his right side hitting his head.  He was not formally evaluated for this at that time.  Since that time he complains of right side pain, shortness of breath most notably with exertion, pain in the center of his lower back.  Associated symptoms include complaints of a intermittent nonproductive cough, mild wheezing, weight loss of approximately 5 pounds, and decreased p.o. intake.  At baseline patient is not on oxygen at home.  He denies having any significant fever, leg swelling, chest pain, nausea, vomiting, or dysuria symptoms.  Notes that he did not take anything besides Tylenol for symptoms symptoms.  ED Course: Upon admission into the emergency department patient was noted to be febrile up to 101.2 F, pulse 72-87, respirations 20-31, blood pressures 113/67 -170/70, and O2 saturations 92-99% on 4 L nasal cannula oxygen.  Patient's O2 saturations were noted to drop as low as 88% with ambulation.  Labs revealed WBC 13.5, BUN 34, creatinine 1.76, BNP 393.5, and  lactic acid 1.4.  CT scan of the brain showed soft tissue contusion over the right orbit, but no intracranial abnormalities.  Chest x-ray showing mild left lung base atelectasis.  Urinalysis negative for any significant signs of infection.  Based of his presentation and concern for underlying pneumonia he was started empirically on ceftriaxone and azithromycin.  Review of Systems  Constitutional: Positive for chills and malaise/fatigue.  HENT: Negative for ear discharge and nosebleeds.   Eyes: Negative for double vision and photophobia.  Respiratory: Positive for cough, shortness of breath and wheezing. Negative for sputum production.   Cardiovascular: Negative for chest pain and leg swelling.  Gastrointestinal: Negative for abdominal pain, diarrhea, nausea and vomiting.  Genitourinary: Negative for dysuria and hematuria.  Musculoskeletal: Positive for back pain and falls.  Skin: Negative for rash.  Neurological: Negative for focal weakness and loss of consciousness.  Psychiatric/Behavioral: Negative for substance abuse. The patient has insomnia.     Past Medical History:  Diagnosis Date  . AAA (abdominal aortic aneurysm) ([place])   . Anemia   . Arthritis   . CAD (coronary artery disease)   . Cancer ([place])    Melanoma - Back  . Carotid artery stenosis   . CHF (congestive heart failure) ([place])   . Cyst of kidney, acquired   . Depression   . Diabetes mellitus   . DVT (deep venous thrombosis) ([place])   . Dysrhythmia   . Fatty liver 2008  . GERD (gastroesophageal reflux disease)   . Hyperlipidemia   .  Hypertension   . IBS (irritable bowel syndrome)   . LVF (left ventricular failure) ([place])   . Paroxysmal atrial fibrillation ([facility])   . Pneumonia Jan. 2014  . Shortness of breath dyspnea   . Thyroid disease   . Typical atrial flutter ([place])   . UTI (urinary tract infection) 05/2015   While in [place]   . Vitamin D deficiency     Past Surgical History:  Procedure Laterality Date  .  ABDOMINAL AORTIC ANEURYSM REPAIR  2002  . CARDIAC CATHETERIZATION    . CORONARY ANGIOPLASTY    . CORONARY ANGIOPLASTY WITH STENT PLACEMENT  2005  . CORONARY ARTERY BYPASS GRAFT  1992  . EYE SURGERY     [place]  . HERNIA REPAIR    . INGUINAL HERNIA REPAIR Bilateral    w/mesh  . INSERTION OF MESH N/A 11/28/2017   Procedure: INSERTION OF MESH;  Surgeon: Donnie Mesa, MD;  Location: [facility];  Service: General;  Laterality: N/A;  GENERAL AND TAP BLOCK  . KNEE SURGERY Bilateral   . LAPAROSCOPIC INGUINAL HERNIA WITH UMBILICAL HERNIA Bilateral 11/28/2017   Procedure: LAPAROSCOPIC BILATERAL INGUINAL HERNIA REPAIR WITH MESH, UMBILICAL HERNIA REPAIR WITH MESH;  Surgeon: Donnie Mesa, MD;  Location: [facility];  Service: General;  Laterality: Bilateral;  GENERAL AND TAP BLOCK  . LEFT HEART CATHETERIZATION WITH CORONARY ANGIOGRAM N/A 10/30/2014   Procedure: LEFT HEART CATHETERIZATION WITH CORONARY ANGIOGRAM;  Surgeon: Larey Dresser, MD;  Location[facility];  Service: Cardiovascular;  Laterality: N/A;  . QUADRICEPS TENDON REPAIR Bilateral 08/2003   Archie Endo 05/10/2011  . UMBILICAL HERNIA REPAIR  11/28/2017   w/mesh     reports that he has quit smoking. His smoking use included cigarettes. He has a 52.50 pack-year smoking history. He has never used smokeless tobacco. He reports current alcohol use of about 3.0 standard drinks of alcohol per week. He reports that he does not use drugs.  Allergies  Allergen Reactions  . Metoprolol Shortness Of Breath, Palpitations and Other (See Comments)    Heart starts racing. Shallow breathing; pt states he also get skin irritation on his legs  . Metformin Hives and Swelling    On legs  . Metformin And Related Nausea And Vomiting    Family History  Problem Relation Age of Onset  . Colon cancer Father   . Coronary artery disease Brother   . Diabetes Brother   . Heart disease Brother   . Hyperlipidemia Brother   . Peripheral vascular disease Brother         Varicose Veins  . Throat cancer Brother        abdominal cancer?   . Diabetes Son   . Hyperlipidemia Son     Prior to Admission medications   Medication Sig Start Date End Date Taking? Authorizing Provider  amiodarone (PACERONE) 200 MG tablet Take 0.5 tablets (100 mg total) by mouth daily. 07/26/18   Larey Dresser, MD  atorvastatin (LIPITOR) 80 MG tablet Take 1 tablet (80 mg total) by mouth daily. 09/14/18 12/25/19  Larey Dresser, MD  carvedilol (COREG) 12.5 MG tablet TAKE 1 TABLET BY MOUTH TWICE DAILY. 07/26/18   Larey Dresser, MD  cilostazol (PLETAL) 100 MG tablet TAKE 1 TABLET BY MOUTH TWICE DAILY. 04/22/19   Larey Dresser, MD  ELIQUIS 2.5 MG TABS tablet TAKE 1 TABLET BY MOUTH TWICE DAILY. 04/22/19   Larey Dresser, MD  ezetimibe (ZETIA) 10 MG tablet TAKE (1) TABLET DAILY. 04/22/19  [name], MD  FLUoxetine (PROZAC) 20 MG capsule TAKE (1) CAPSULE DAILY. 04/22/19   [name], MD  furosemide (LASIX) 80 MG tablet TAKE 1&1/2 TABLETS IN THE MORNING AND 1 TABLET IN THE AFTERNOON. 04/22/19   Larey Dresser, MD  gabapentin (NEURONTIN) 100 MG capsule Take 100 mg by mouth 3 (three) times daily.    [provider]  glucose blood (ACCU-CHEK AVIVA PLUS) test strip CHECK BLOOD SUGAR 4 TIMES A DAY. Patient not taking: Reported on 05/23/2019 02/19/18   [name], MD  hydrALAZINE (APRESOLINE) 50 MG tablet Take 1 tablet (50 mg total) by mouth 3 (three) times daily. 12/24/18   Larey Dresser, MD  HYDROcodone-acetaminophen (NORCO/VICODIN) 5-325 MG tablet hydrocodone 5 mg-acetaminophen 325 mg tablet    [provider]  Influenza Vac A&B Surf Ant Adj (FLUAD) 0.5 ML SUSY     [provider]  isosorbide mononitrate (IMDUR) 30 MG 24 hr tablet TAKE 1 TABLET EACH DAY. 04/22/19   Larey Dresser, MD  Lancets MISC Check blood sugar 4 times a day Patient not taking: Reported on 06/13/2019 02/19/18   [name], MD  levothyroxine (SYNTHROID, LEVOTHROID) 125 MCG  tablet Take 1 tablet (125 mcg total) by mouth daily before breakfast. 12/24/18   Larey Dresser, MD  Multiple Vitamin (MULTIVITAMIN) tablet Take 1 tablet by mouth daily.    [provider]  nitroGLYCERIN (NITROSTAT) 0.4 MG SL tablet DISSOLVE 1 TABLET UNDER TONGUE AS NEEDED FOR CHEST PAIN,MAY REPEAT IN5 MINUTES FOR 2 DOSES. Patient not taking: Reported on 06/13/2019 10/12/18   Larey Dresser, MD  OXYCODONE HCL PO oxycodone 5 mg tablet    [provider]  Potassium Chloride ER 20 MEQ TBCR Take 20 mEq by mouth daily. 06/07/18 05/02/19  Bensimhon, Shaune Pascal, MD  potassium chloride SA (K-DUR) 20 MEQ tablet Take 1 tablet (20 mEq total) by mouth daily. 06/13/19   Larey Dresser, MD  predniSONE (DELTASONE) 10 MG tablet prednisone 10 mg tablet    [provider]  SPIRIVA HANDIHALER 18 MCG inhalation capsule PLACE 1 CAPSULE INTO INHALER AND INHALE ONCE DAILY. 01/23/19   Larey Dresser, MD  temazepam (RESTORIL) 30 MG capsule TAKE 1 CAPSULE AT BEDTIME AS NEEDED FOR SLEEP. 02/13/18   [name], MD  traMADol (ULTRAM) 50 MG tablet Take 1 tablet (50 mg total) by mouth every 6 (six) hours as needed. Patient not taking: Reported on 02/21/2019 09/11/17   Rosemarie Ax, MD    Physical Exam:  Constitutional: Obese appearing elderly male who appears sick, but in no acute distress Vitals:   07/02/19 0500 07/02/19 0515 07/02/19 0530 07/02/19 0615  BP: 121/68 115/63 113/67 134/75  Pulse: 78 92 92   Resp: (!) 26 (!) 31 20   Temp:      TempSrc:      SpO2: 96% 97% 99%    Eyes: PERRL, right periorbital swelling ENMT: Mucous membranes are dry. Posterior pharynx clear of any exudate or lesions.  Neck: normal, supple, no masses, no thyromegaly Respiratory: clear to auscultation bilaterally, no wheezing, no crackles. Normal respiratory effort. No accessory muscle use.  Cardiovascular: Irregularly irregular, no murmurs / rubs / gallops.  Trace bilateral lower extremity edema. 2+ pedal  pulses. No carotid bruits.  Abdomen: Protuberant abdomen, mild tenderness to palpation on the right flank, no masses palpated. No hepatosplenomegaly. Bowel sounds positive.  Musculoskeletal: no clubbing / cyanosis.  Tenderness to palpation of the lumbar spine.  Skin: Venous stasis noted of the bilateral lower extremities Neurologic: CN 2-12 grossly intact. Sensation intact, DTR normal. Strength 5/5 in all 4.  Psychiatric: Normal judgment and insight. Alert and oriented x 3. Normal mood.     Labs on Admission: I have personally reviewed following labs and imaging studies  CBC: Recent Labs  Lab 07/02/19 0251  WBC 13.5*  NEUTROABS 11.3*  HGB 13.3  HCT 40.9  MCV 99.3  PLT 419   Basic Metabolic Panel: Recent Labs  Lab 07/02/19 0251  NA 142  K 3.6  CL 101  CO2 27  GLUCOSE 151*  BUN 34*  CREATININE 1.76*  CALCIUM 9.4   GFR: CrCl cannot be calculated (Unknown ideal weight.). Liver Function Tests: Recent Labs  Lab 07/02/19 0251  AST 32  ALT 31  ALKPHOS 70  BILITOT 1.0  PROT 7.1  ALBUMIN 4.0   No results for input(s): LIPASE, AMYLASE in the last 168 hours. No results for input(s): AMMONIA in the last 168 hours. Coagulation Profile: No results for input(s): INR, PROTIME in the last 168 hours. Cardiac Enzymes: No results for input(s): CKTOTAL, CKMB, CKMBINDEX, TROPONINI in the last 168 hours. BNP (last 3 results) No results for input(s): PROBNP in the last 8760 hours. HbA1C: No results for input(s): HGBA1C in the last 72 hours. CBG: Recent Labs  Lab 07/02/19 0250  GLUCAP 147*   Lipid Profile: No results for input(s): CHOL, HDL, LDLCALC, TRIG, CHOLHDL, LDLDIRECT in the last 72 hours. Thyroid Function Tests: No results for input(s): TSH, T4TOTAL, FREET4, T3FREE, THYROIDAB in the last 72 hours. Anemia Panel: No results for input(s): VITAMINB12, FOLATE, FERRITIN, TIBC, IRON, RETICCTPCT in the last 72 hours. Urine analysis:    Component Value Date/Time    COLORURINE STRAW (A) 07/02/2019 0259   APPEARANCEUR CLEAR 07/02/2019 0259   LABSPEC 1.009 07/02/2019 0259   PHURINE 7.0 07/02/2019 0259   GLUCOSEU NEGATIVE 07/02/2019 0259   GLUCOSEU NEGATIVE 01/13/2016 1342   HGBUR SMALL (A) 07/02/2019 0259   HGBUR negative 02/03/2009 0912   BILIRUBINUR NEGATIVE 07/02/2019 0259   BILIRUBINUR small 07/05/2015 1020   KETONESUR NEGATIVE 07/02/2019 0259   PROTEINUR 100 (A) 07/02/2019 0259   UROBILINOGEN 0.2 01/13/2016 1342   NITRITE NEGATIVE 07/02/2019 0259   LEUKOCYTESUR NEGATIVE 07/02/2019 0259   Sepsis Labs: Recent Results (from the past 240 hour(s))  SARS Coronavirus 2 (CEPHEID- Performed in [facility] hospital lab), Hosp Order     Status: None   Collection Time: 07/02/19  3:17 AM   Specimen: Nasopharyngeal Swab  Result Value Ref Range Status   SARS Coronavirus 2 NEGATIVE NEGATIVE Final    Comment: (NOTE) If result is NEGATIVE SARS-CoV-2 target nucleic acids are NOT DETECTED. The SARS-CoV-2 RNA is generally detectable in upper and lower  respiratory specimens during the acute phase of infection. The lowest  concentration of SARS-CoV-2 viral copies this assay can detect is 250  copies / mL. A negative result does not preclude SARS-CoV-2 infection  and should not be used as the sole basis for treatment or other  patient management decisions.  A negative result may occur with  improper specimen collection / handling, submission of specimen other  than nasopharyngeal swab, presence of viral mutation(s) within the  areas targeted by this assay, and inadequate number of viral copies  (<250 copies / mL). A negative result must be combined with clinical  observations, patient history, and epidemiological information. If result is POSITIVE SARS-CoV-2 target nucleic acids are DETECTED. The SARS-CoV-2 RNA is  generally detectable in upper and lower  respiratory specimens dur ing the acute phase of infection.  Positive  results are indicative of active  infection with SARS-CoV-2.  Clinical  correlation with patient history and other diagnostic information is  necessary to determine patient infection status.  Positive results do  not rule out bacterial infection or co-infection with other viruses. If result is PRESUMPTIVE POSTIVE SARS-CoV-2 nucleic acids MAY BE PRESENT.   A presumptive positive result was obtained on the submitted specimen  and confirmed on repeat testing.  While 2019 novel coronavirus  (SARS-CoV-2) nucleic acids may be present in the submitted sample  additional confirmatory testing may be necessary for epidemiological  and / or clinical management purposes  to differentiate between  SARS-CoV-2 and other Sarbecovirus currently known to infect humans.  If clinically indicated additional testing with an alternate test  methodology [phone]) is advised. The SARS-CoV-2 RNA is generally  detectable in upper and lower respiratory sp ecimens during the acute  phase of infection. The expected result is Negative. Fact Sheet for Patients:  StrictlyIdeas.no Fact Sheet for Healthcare Providers: BankingDealers.co.za This test is not yet approved or cleared by the [place] FDA and has been authorized for detection and/or diagnosis of SARS-CoV-2 by FDA under an Emergency Use Authorization (EUA).  This EUA will remain in effect (meaning this test can be used) for the duration of the COVID-19 declaration under Section 564(b)(1) of the Act, 21 U.S.C. section 360bbb-3(b)(1), unless the authorization is terminated or revoked sooner. Performed at [facility], [place] [address], [place], [place] [address]      Radiological Exams on Admission: Ct Head Wo Contrast  Result Date: 07/02/2019 CLINICAL DATA:  Head trauma secondary to seizure like activity at home. Soft tissue swelling over the right orbit. EXAM: CT HEAD WITHOUT CONTRAST TECHNIQUE: Contiguous axial images were obtained  from the base of the skull through the vertex without intravenous contrast. COMPARISON:  CT scan dated 03/22/2016 FINDINGS: Brain: No acute hemorrhage. No mass lesion. Diffuse stable atrophy with secondary ventricular dilatation. Periventricular white matter lucency is consistent with chronic small vessel ischemic change, stable. Vascular: No hyperdense vessel or unexpected calcification. Skull: Normal. Negative for fracture or focal lesion. Sinuses/Orbits: Normal. Other: Slight soft tissue swelling adjacent to the right superior orbital rim consistent with soft tissue contusion. IMPRESSION: No acute intracranial abnormality. Soft tissue contusion over the right orbit. Electronically Signed   By: Lorriane Shire M.D.   On: 07/02/2019 05:14   Dg Chest Port 1 View  Result Date: 07/02/2019 CLINICAL DATA:  Shortness of breath. EXAM: PORTABLE CHEST 1 VIEW COMPARISON:  05/06/2019 FINDINGS: Post median sternotomy. Unchanged heart size and mediastinal contours with aortic atherosclerosis and tortuosity. Chronic elevation of right hemidiaphragm. No pulmonary edema. No pleural fluid or pneumothorax. Mild left lung base atelectasis, no confluent airspace disease. IMPRESSION: 1. Mild left lung base atelectasis. 2.  Aortic Atherosclerosis (ICD10-I70.0). Electronically Signed   By: Keith Rake M.D.   On: 07/02/2019 03:31    EKG: Independently reviewed.  Atrial fibrillation at 85 bpm  Assessment/Plan Sepsis secondary to suspected community-acquired pneumonia: Acute.  Patient presents febrile up to 101.2 F, tachypneic, and leukocytosis.  Lactic acid was reassuring at 1.4.  Chest x-ray showing left lung base atelectasis. -Admit to a medical telemetry -Follow-up blood and sputum cultures/studies -Add on procalcitonin -Continue empiric antibiotics of Rocephin and azithromycin -Mucinex -Incentive spirometry  Hypoxia and COPD, with mild acute exacerbation: Patient did report wheezing at home.  O2  saturations as low  as 88% with ambulation.  -Continuous pulse oximetry overnight with nasal cannula oxygen as needed -Continue Spiriva -125 mg of Solu-Medrol x1 dose now  -Albuterol nebs twice daily and as needed shortness of breath/wheeze -Reassess in a.m. to determine if patient would benefit from continued IV versus p.o. steroids  Recent fall, lumbar back pain: Acute.  Patient reports complaints of severe pain in the center of his lower back.  No distinctive step-off noted on physical exam. -Check x-rays of the lumbar spine -Continue gabapentin -Physical therapy to eval and treat  Diastolic Congestive heart failure: Last EF noted to be 60 to 65% with grade 2 diastolic dysfunction and 12/5724.  BNP seen to be elevated at 393.5, but chest x-ray showing stable heart size without significant signs of fluid overload. -Strict I&Os -Daily weights -Continue oral furosemide  Paroxysmal atrial fibrillation on chronic anticoagulation: Patient currently in atrial fibrillation but appears rate controlled.  Home medications include Eliquis. -Continue Eliquis and amiodarone  Essential hypertension: Blood pressures currently elevated up to 151/86. -Continue Coreg, hydralazine, and isosorbide mononitrate   Chronic kidney disease stage III: Patient's creatinine on admission 1.76 with BUN 34.  At baseline patient creatinine appears to range from 1.6-1.8. -Recheck BMP in a.m.  Diabetes mellitus type 2: Appears diet controlled.  Patient not on any medications for treatment at home and last hemoglobin A1c noted to be 6.6 back in 11/2017. -Daily CBG monitoring -Add-on sliding scale insulin if warranted  Hypothyroidism -Check TSH -Continue levothyroxine    DVT prophylaxis: Eliquis Code Status: Full Family Communication: discussed care with patient's wife over the phone Disposition Plan:  Consults called: none  Admission status: inpatient   Norval Morton MD [facility] Pager [phone]   If 7PM-7AM,  please contact night-coverage [email or web address] Password [facility]  07/02/2019, 7:42 AM

## 2019-07-02 NOTE — ED Notes (Signed)
RN and pt back from CT 

## 2019-07-02 NOTE — Progress Notes (Addendum)
Procalcitonin noted to be <0.1 and patient pneumonia less likely.  Urinalysis was also noted to be negative for signs of infection.  Follow-up cultures.  Unclear cause of symptoms at this time.   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  Question need for further work-up with MRI. Patient was given steroids for suspected COPD exacerbation and blood sugars elevated into the 250s.  Started patient on sensitive sliding scale insulin.

## 2019-07-02 NOTE — ED Notes (Signed)
Pt to go to the floor once returned from xray.

## 2019-07-02 NOTE — ED Triage Notes (Signed)
Pt arrives to ED via [facility] EMS s/p seizure like activity at home.  [name] heard a loud noise and found him face down "shaking" per spouse.  Pt does not recall the incident.    Pt is on Eliquis with a hematoma noted to the right eye.  Pt has a fall on 06/24/19 and was not evaluated.  EMS reports pt's abd is distended, taught and tender. Pt complains of right flank pain and has right chest bruising.

## 2019-07-02 NOTE — ED Provider Notes (Signed)
[facility] EMERGENCY DEPARTMENT Provider Note   CSN: [id number] Arrival date & time: 07/02/19  0215    History   Chief Complaint Chief Complaint  Patient presents with   Seizures    HPI Andrew [name] is a 85 y.o. male.     HPI  This is an 85 year old male with a history of AAA, coronary artery disease, CHF, diabetes, atrial fibrillation on Eliquis who presents with chills, shortness of breath, and possible seizure activity.  Per EMS report, they were called for seizure-like activity.  Wife reported hearing a loud noise and finding him shaking.  Patient does not recall the incident.  He reports that over the last day he has been more short of breath and has had chills.  Denies cough.  Unknown fever at home.  Denies any sick contacts or COVID exposures.  Of note, he did sustain a fall on 6/29.  He did not get evaluated for this.  Was noted to hit his head.  Patient denies any lower extremity swelling, chest pain, abdominal pain, nausea, vomiting.  She reports a smoking history.  No known history of COPD.  He is not on home oxygen.  Past Medical History:  Diagnosis Date   AAA (abdominal aortic aneurysm) ([place])    Anemia    Arthritis    CAD (coronary artery disease)    Cancer (HCC)    Melanoma - Back   Carotid artery stenosis    CHF (congestive heart failure) ([place])    Cyst of kidney, acquired    Depression    Diabetes mellitus    DVT (deep venous thrombosis) ([place])    Dysrhythmia    Fatty liver 2008   GERD (gastroesophageal reflux disease)    Hyperlipidemia    Hypertension    IBS (irritable bowel syndrome)    LVF (left ventricular failure) (HCC)    Paroxysmal atrial fibrillation ([place])    Pneumonia Jan. 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 ([place]) 10/16/2015   PVD (peripheral vascular disease) ([place])    Hereditary and idiopathic peripheral neuropathy 10/02/2015   Peripelvic (lymphatic) cyst    Pernicious anemia 07/15/2015   Bilateral renal cysts 07/12/2015   Lung nodule, 43mm RML CT 07/12/15 07/12/2015   Constipation 07/12/2015   Kidney stone on right side 07/12/2015   Stage 3 chronic kidney disease ([place]) 06/14/2015   Chronic diastolic CHF (congestive heart failure) ([place]) 05/28/2015   Paroxysmal atrial fibrillation ([place]) 05/28/2015   AAA (abdominal aortic aneurysm) without rupture ([place]) 09/30/2014   Left-sided low back pain without sciatica 07/21/2014   Cardiomyopathy, ischemic 05/23/2014   DM (diabetes mellitus), type 2 with renal complications ([place]) 68/34/1962   CORONARY ATHEROSCLEROSIS NATIVE CORONARY ARTERY 11/09/2010   Depression, major, single episode, complete remission ([place]) 03/18/2010   Hypothyroidism 02/03/2009   Osteoarthritis 05/27/2008   Hyperlipidemia associated with type 2 diabetes mellitus ([place]) 06/14/2007   Hypertension associated with diabetes ([place]) 06/14/2007  GERD 06/14/2007    Past Surgical History:  Procedure Laterality Date   ABDOMINAL AORTIC ANEURYSM REPAIR  2002   CARDIAC CATHETERIZATION     CORONARY ANGIOPLASTY     CORONARY ANGIOPLASTY WITH STENT PLACEMENT  2005   CORONARY ARTERY BYPASS GRAFT  1992   EYE SURGERY     [place]   HERNIA REPAIR     INGUINAL HERNIA REPAIR Bilateral    w/mesh   INSERTION OF MESH N/A 11/28/2017   Procedure: INSERTION OF MESH;  Surgeon: Donnie Mesa, MD;  Location: [facility];  Service: General;   Laterality: N/A;  GENERAL AND TAP BLOCK   KNEE SURGERY Bilateral    LAPAROSCOPIC INGUINAL HERNIA WITH UMBILICAL HERNIA Bilateral 11/28/2017   Procedure: LAPAROSCOPIC BILATERAL INGUINAL HERNIA REPAIR WITH MESH, UMBILICAL HERNIA REPAIR WITH MESH;  Surgeon: Donnie Mesa, MD;  Location: [facility];  Service: General;  Laterality: Bilateral;  GENERAL AND TAP BLOCK   LEFT HEART CATHETERIZATION WITH CORONARY ANGIOGRAM N/A 10/30/2014   Procedure: LEFT HEART CATHETERIZATION WITH CORONARY ANGIOGRAM;  Surgeon: Larey Dresser, MD;  Location: [facility] CATH LAB;  Service: Cardiovascular;  Laterality: N/A;   QUADRICEPS TENDON REPAIR Bilateral 08/2003   Archie Endo 6[birth year]   UMBILICAL HERNIA REPAIR  11/28/2017   w/mesh        Home Medications    Prior to Admission medications   Medication Sig Start Date End Date Taking? Authorizing Provider  amiodarone (PACERONE) 200 MG tablet Take 0.5 tablets (100 mg total) by mouth daily. 07/26/18   Larey Dresser, MD  atorvastatin (LIPITOR) 80 MG tablet Take 1 tablet (80 mg total) by mouth daily. 09/14/18 12/25/19  Larey Dresser, MD  carvedilol (COREG) 12.5 MG tablet TAKE 1 TABLET BY MOUTH TWICE DAILY. 07/26/18   Larey Dresser, MD  cilostazol (PLETAL) 100 MG tablet TAKE 1 TABLET BY MOUTH TWICE DAILY. 04/22/19   Larey Dresser, MD  ELIQUIS 2.5 MG TABS tablet TAKE 1 TABLET BY MOUTH TWICE DAILY. 04/22/19   Larey Dresser, MD  ezetimibe (ZETIA) 10 MG tablet TAKE (1) TABLET DAILY. 04/22/19   [name], MD  FLUoxetine (PROZAC) 20 MG capsule TAKE (1) CAPSULE DAILY. 04/22/19   [name], MD  furosemide (LASIX) 80 MG tablet TAKE 1&1/2 TABLETS IN THE MORNING AND 1 TABLET IN THE AFTERNOON. 04/22/19   Larey Dresser, MD  gabapentin (NEURONTIN) 100 MG capsule Take 100 mg by mouth 3 (three) times daily.    [provider]  glucose blood (ACCU-CHEK AVIVA PLUS) test strip CHECK BLOOD SUGAR 4 TIMES A DAY. Patient not taking: Reported on 05/23/2019 02/19/18   [name], MD  hydrALAZINE (APRESOLINE) 50 MG tablet Take 1 tablet (50 mg total) by mouth 3 (three) times daily. 12/24/18   Larey Dresser, MD  HYDROcodone-acetaminophen (NORCO/VICODIN) 5-325 MG tablet hydrocodone 5 mg-acetaminophen 325 mg tablet    [provider]  Influenza Vac A&B Surf Ant Adj (FLUAD) 0.5 ML SUSY     [provider]  isosorbide mononitrate (IMDUR) 30 MG 24 hr tablet TAKE 1 TABLET EACH DAY. 04/22/19   Larey Dresser, MD  Lancets MISC Check blood sugar 4 times a day Patient not taking: Reported on 06/13/2019 02/19/18   [name], MD  levothyroxine (SYNTHROID, LEVOTHROID) 125 MCG tablet Take 1 tablet (125 mcg total) by mouth daily before breakfast. 12/24/18   Larey Dresser, MD  Multiple Vitamin (MULTIVITAMIN) tablet Take 1 tablet by mouth daily.  [provider]  nitroGLYCERIN (NITROSTAT) 0.4 MG SL tablet DISSOLVE 1 TABLET UNDER TONGUE AS NEEDED FOR CHEST PAIN,MAY REPEAT IN5 MINUTES FOR 2 DOSES. Patient not taking: Reported on 06/13/2019 10/12/18   Larey Dresser, MD  OXYCODONE HCL PO oxycodone 5 mg tablet    [provider]  Potassium Chloride ER 20 MEQ TBCR Take 20 mEq by mouth daily. 06/07/18 05/02/19  Bensimhon, Shaune Pascal, MD  potassium chloride SA (K-DUR) 20 MEQ tablet Take 1 tablet (20 mEq total) by mouth daily. 06/13/19   Larey Dresser, MD  predniSONE (DELTASONE) 10 MG tablet prednisone 10 mg tablet    [provider]  SPIRIVA HANDIHALER 18 MCG inhalation capsule PLACE 1 CAPSULE INTO INHALER AND INHALE ONCE DAILY. 01/23/19   Larey Dresser, MD  temazepam (RESTORIL) 30 MG capsule TAKE 1 CAPSULE AT BEDTIME AS NEEDED FOR SLEEP. 02/13/18   [name], MD  traMADol (ULTRAM) 50 MG tablet Take 1 tablet (50 mg total) by mouth every 6 (six) hours as needed. Patient not taking: Reported on 02/21/2019 09/11/17   Rosemarie Ax, MD    Family History Family History  Problem Relation Age of Onset   Colon cancer Father     Coronary artery disease Brother    Diabetes Brother    Heart disease Brother    Hyperlipidemia Brother    Peripheral vascular disease Brother        Varicose Veins   Throat cancer Brother        abdominal cancer?    Diabetes Son    Hyperlipidemia Son     Social History Social History   Tobacco Use   Smoking status: Former Smoker    Packs/day: 1.50    Years: 35.00    Pack years: 52.50    Types: Cigarettes   Smokeless tobacco: Never Used   Tobacco comment: quit atleast 25 yrs ago, per pt  Substance Use Topics   Alcohol use: Yes    Alcohol/week: 3.0 standard drinks    Types: 3 Shots of liquor per week    Comment: socially   Drug use: No     Allergies   Metoprolol, Metformin, and Metformin and related   Review of Systems Review of Systems  Constitutional: Positive for chills and fever.  Respiratory: Positive for shortness of breath. Negative for cough.   Cardiovascular: Negative for chest pain and leg swelling.  Gastrointestinal: Negative for abdominal pain, nausea and vomiting.  Neurological: Positive for seizures. Negative for dizziness.  All other systems reviewed and are negative.    Physical Exam Updated Vital Signs BP 134/75    Pulse 92    Temp 99 F (37.2 C) (Rectal)    Resp 20    SpO2 99%   Physical Exam Vitals signs and nursing note reviewed.  Constitutional:      Appearance: He is well-developed. He is not ill-appearing.  HENT:     Head: Normocephalic and atraumatic.  Eyes:     Pupils: Pupils are equal, round, and reactive to light.  Neck:     Musculoskeletal: Neck supple.  Cardiovascular:     Rate and Rhythm: Normal rate and regular rhythm.     Heart sounds: Normal heart sounds. No murmur.  Pulmonary:     Effort: Pulmonary effort is normal. No respiratory distress.     Breath sounds: Normal breath sounds. No wheezing.     Comments: Nasal cannula in place, mild tachypnea, diminished breath sounds bilateral lower lobes Abdominal:  General: Bowel sounds are normal.     Palpations: Abdomen is soft.     Tenderness: There is no abdominal tenderness. There is no rebound.  Musculoskeletal:     Right lower leg: Edema present.     Left lower leg: Edema present.     Comments: Trace to 1+ bilateral lower extremity edema  Skin:    General: Skin is warm and dry.  Neurological:     Mental Status: He is alert and oriented to person, place, and time.     Comments: 5 strength in all 4 extremities, no dysmetria to finger-nose-finger  Psychiatric:        Mood and Affect: Mood normal.      ED Treatments / Results  Labs (all labs ordered are listed, but only abnormal results are displayed) Labs Reviewed  COMPREHENSIVE METABOLIC PANEL - Abnormal; Notable for the following components:      Result Value   Glucose, Bld 151 (*)    BUN 34 (*)    Creatinine, Ser 1.76 (*)    GFR calc non Af Amer 34 (*)    GFR calc Af Amer 39 (*)    All other components within normal limits  CBC WITH DIFFERENTIAL/PLATELET - Abnormal; Notable for the following components:   WBC 13.5 (*)    RBC 4.12 (*)    Neutro Abs 11.3 (*)    Monocytes Absolute 1.1 (*)    Abs Immature Granulocytes 0.08 (*)    All other components within normal limits  URINALYSIS, ROUTINE W REFLEX MICROSCOPIC - Abnormal; Notable for the following components:   Color, Urine STRAW (*)    Hgb urine dipstick SMALL (*)    Protein, ur 100 (*)    All other components within normal limits  BRAIN NATRIURETIC PEPTIDE - Abnormal; Notable for the following components:   B Natriuretic Peptide 393.5 (*)    All other components within normal limits  CBG MONITORING, ED - Abnormal; Notable for the following components:   Glucose-Capillary 147 (*)    All other components within normal limits  SARS CORONAVIRUS 2 (HOSPITAL ORDER, [facility] LAB)  CULTURE, BLOOD (ROUTINE X 2)  CULTURE, BLOOD (ROUTINE X 2)  LACTIC ACID, PLASMA  LACTIC ACID, PLASMA    EKG EKG  Interpretation  Date/Time:  Tuesday July 02 2019 02:31:58 EDT Ventricular Rate:  85 PR Interval:    QRS Duration: 170 QT Interval:  461 QTC Calculation: 549 R Axis:   -74 Text Interpretation:  Atrial fibrillation Ventricular premature complex Nonspecific IVCD with LAD Now in atrial fibrillation Confirmed by Thayer Jew [phone]) on 07/02/2019 3:41:13 AM   Radiology Ct Head Wo Contrast  Result Date: 07/02/2019 CLINICAL DATA:  Head trauma secondary to seizure like activity at home. Soft tissue swelling over the right orbit. EXAM: CT HEAD WITHOUT CONTRAST TECHNIQUE: Contiguous axial images were obtained from the base of the skull through the vertex without intravenous contrast. COMPARISON:  CT scan dated 03/22/2016 FINDINGS: Brain: No acute hemorrhage. No mass lesion. Diffuse stable atrophy with secondary ventricular dilatation. Periventricular white matter lucency is consistent with chronic small vessel ischemic change, stable. Vascular: No hyperdense vessel or unexpected calcification. Skull: Normal. Negative for fracture or focal lesion. Sinuses/Orbits: Normal. Other: Slight soft tissue swelling adjacent to the right superior orbital rim consistent with soft tissue contusion. IMPRESSION: No acute intracranial abnormality. Soft tissue contusion over the right orbit. Electronically Signed   By: Lorriane Shire M.D.   On: 07/02/2019 05:14  Dg Chest Port 1 View  Result Date: 07/02/2019 CLINICAL DATA:  Shortness of breath. EXAM: PORTABLE CHEST 1 VIEW COMPARISON:  05/06/2019 FINDINGS: Post median sternotomy. Unchanged heart size and mediastinal contours with aortic atherosclerosis and tortuosity. Chronic elevation of right hemidiaphragm. No pulmonary edema. No pleural fluid or pneumothorax. Mild left lung base atelectasis, no confluent airspace disease. IMPRESSION: 1. Mild left lung base atelectasis. 2.  Aortic Atherosclerosis (ICD10-I70.0). Electronically Signed   By: Keith Rake M.D.   On:  07/02/2019 03:31    Procedures Procedures (including critical care time)  Medications Ordered in ED Medications  cefTRIAXone (ROCEPHIN) 2 g in sodium chloride 0.9 % 100 mL IVPB (0 g Intravenous Stopped 07/02/19 0343)  azithromycin (ZITHROMAX) 500 mg in sodium chloride 0.9 % 250 mL IVPB (0 mg Intravenous Stopped 07/02/19 0528)     Initial Impression / Assessment and Plan / ED Course  I have reviewed the triage vital signs and the nursing notes.  Pertinent labs & imaging results that were available during my care of the patient were reviewed by me and considered in my medical decision making (see chart for details).        Patient presents with shortness of breath from home.  Questionable seizure activity.  No history of seizures.  He reports chills and rigors.  He is febrile on arrival.  He is otherwise nontoxic-appearing.  He is requiring some supplemental oxygen.  This is new for him.  Sepsis work-up initiated.  Patient was covered with Rocephin and azithromycin for community-acquired pneumonia.  COVID testing was also sent.  Patient does not appear to be in septic shock.  Vital signs have remained stable and lactate is normal.  He has a leukocytosis.  Chest x-ray was read as left lung base atelectasis.  However, given clinical scenario, would be concern for occult pneumonia.  ET head was also obtained to rule out bleed.  CT head is negative.  Patient ambulated and dropped his O2 sats into the mid 80s.  COVID testing is negative.  Will admit for further management and evaluation.    Final Clinical Impressions(s) / ED Diagnoses   Final diagnoses:  Hypoxia  SOB (shortness of breath)    ED Discharge Orders    None       Oriya Kettering, Barbette Hair, MD 07/02/19 [phone]

## 2019-07-03 ENCOUNTER — Inpatient Hospital Stay (HOSPITAL_COMMUNITY): Payer: Medicare Other

## 2019-07-03 DIAGNOSIS — I4821 Permanent atrial fibrillation: Secondary | ICD-10-CM

## 2019-07-03 DIAGNOSIS — R0602 Shortness of breath: Secondary | ICD-10-CM

## 2019-07-03 DIAGNOSIS — R0902 Hypoxemia: Secondary | ICD-10-CM

## 2019-07-03 LAB — BASIC METABOLIC PANEL
Anion gap: 10 (ref 5–15)
BUN: 45 mg/dL — ABNORMAL HIGH (ref 8–23)
CO2: 25 mmol/L (ref 22–32)
Calcium: 8.5 mg/dL — ABNORMAL LOW (ref 8.9–10.3)
Chloride: 101 mmol/L (ref 98–111)
Creatinine, Ser: 2.05 mg/dL — ABNORMAL HIGH (ref 0.61–1.24)
GFR calc Af Amer: 33 mL/min — ABNORMAL LOW (ref >60)
GFR calc non Af Amer: 28 mL/min — ABNORMAL LOW (ref >60)
Glucose, Bld: 264 mg/dL — ABNORMAL HIGH (ref 70–99)
Potassium: 3.7 mmol/L (ref 3.5–5.1)
Sodium: 136 mmol/L (ref 135–145)

## 2019-07-03 LAB — CBC
HCT: 34.4 % — ABNORMAL LOW (ref 39.0–52.0)
Hemoglobin: 11.7 g/dL — ABNORMAL LOW (ref 13.0–17.0)
MCH: 33.4 pg (ref 26.0–34.0)
MCHC: 34 g/dL (ref 30.0–36.0)
MCV: 98.3 fL (ref 80.0–100.0)
Platelets: 132 10*3/uL — ABNORMAL LOW (ref 150–400)
RBC: 3.5 MIL/uL — ABNORMAL LOW (ref 4.22–5.81)
RDW: 12.6 % (ref 11.5–15.5)
WBC: 11.7 10*3/uL — ABNORMAL HIGH (ref 4.0–10.5)
nRBC: 0 % (ref 0.0–0.2)

## 2019-07-03 LAB — NOVEL CORONAVIRUS, NAA (HOSP ORDER, SEND-OUT TO REF LAB; TAT 18-24 HRS): SARS-CoV-2, NAA: NOT DETECTED

## 2019-07-03 LAB — GLUCOSE, CAPILLARY
Glucose-Capillary: 240 mg/dL — ABNORMAL HIGH (ref 70–99)
Glucose-Capillary: 219 mg/dL — ABNORMAL HIGH (ref 70–99)
Glucose-Capillary: 202 mg/dL — ABNORMAL HIGH (ref 70–99)
Glucose-Capillary: 169 mg/dL — ABNORMAL HIGH (ref 70–99)

## 2019-07-03 LAB — PROCALCITONIN: Procalcitonin: 0.15 ng/mL

## 2019-07-03 LAB — LEGIONELLA PNEUMOPHILA SEROGP 1 UR AG: L. pneumophila Serogp 1 Ur Ag: NEGATIVE

## 2019-07-03 MED ORDER — IPRATROPIUM-ALBUTEROL 0.5-2.5 (3) MG/3ML IN SOLN: 3.0000 mL | RESPIRATORY_TRACT | Status: DC | PRN

## 2019-07-03 MED ORDER — INSULIN ASPART 100 UNIT/ML ~~LOC~~ SOLN
0.0000 [IU] | Freq: Three times a day (TID) | SUBCUTANEOUS | Status: DC
  Administered 2019-07-03: 5 [IU] via SUBCUTANEOUS
  Administered 2019-07-04: 11 [IU] via SUBCUTANEOUS
  Administered 2019-07-04: 5 [IU] via SUBCUTANEOUS

## 2019-07-03 MED ORDER — INSULIN ASPART 100 UNIT/ML ~~LOC~~ SOLN
0.0000 [IU] | Freq: Every day | SUBCUTANEOUS | Status: DC
  Administered 2019-07-03: 3 [IU] via SUBCUTANEOUS

## 2019-07-03 MED ORDER — METHYLPREDNISOLONE SODIUM SUCC 125 MG IJ SOLR
60.0000 mg | Freq: Two times a day (BID) | INTRAMUSCULAR | Status: DC
  Administered 2019-07-03 – 2019-07-04 (×2): 60 mg via INTRAVENOUS
  Filled 2019-07-03 (×2): qty 2

## 2019-07-03 MED ORDER — INSULIN DETEMIR 100 UNIT/ML ~~LOC~~ SOLN
5.0000 [IU] | Freq: Two times a day (BID) | SUBCUTANEOUS | Status: DC
  Administered 2019-07-03 – 2019-07-04 (×3): 5 [IU] via SUBCUTANEOUS
  Filled 2019-07-03 (×4): qty 0.05

## 2019-07-03 NOTE — Progress Notes (Signed)
Dr.Schorr paged re: pt very angry and insisting on going home tonight. Refusing to stay.

## 2019-07-03 NOTE — Progress Notes (Signed)
PT Cancellation Note  Patient Details Name: Andrew [name] MRN: [id number] DOB: 1928-11-24   Cancelled Treatment:     per RN pt about to be transported to CT, will follow.   Reinaldo Berber, PT, DPT Acute Rehabilitation Services Pager: [phone] Office: [phone]     Reinaldo Berber 07/03/2019, 5:12 PM

## 2019-07-03 NOTE — Progress Notes (Signed)
Dr.Schorr paged (texted) Pt is yelling out or calling staff every couple of minutes insisting on leaving. Pt very angry and has called family to come pick him up.

## 2019-07-03 NOTE — Progress Notes (Signed)
Inpatient Diabetes Program Recommendations  AACE/ADA: New Consensus Statement on Inpatient Glycemic Control (2015)  Target Ranges:  Prepandial:   less than 140 mg/dL      Peak postprandial:   less than 180 mg/dL (1-2 hours)      Critically ill patients:  140 - 180 mg/dL   Lab Results  Component Value Date   GLUCAP 240 (H) 07/03/2019   HGBA1C 7.3 (H) 07/02/2019    Review of Glycemic Control Results for Andrew [name], Andrew [name] (MRN [id number]) as of 07/03/2019 11:23  Ref. Range 07/02/2019 02:50 07/02/2019 12:09 07/02/2019 17:17 07/02/2019 20:52 07/03/2019 08:24  Glucose-Capillary Latest Ref Range: 70 - 99 mg/dL 147 (H) 182 (H) 254 (H) 366 (H) 240 (H)   Diabetes history: DM - Diet controlled Outpatient Diabetes medications: None Current orders for Inpatient glycemic control:  Novolog sensitive tid with meals and HS Inpatient Diabetes Program Recommendations:   May consider adding Levemir 5 units bid while on steroids.   Thanks,  Adah Perl, RN, BC-ADM Inpatient Diabetes Coordinator Pager [phone] (8a-5p)

## 2019-07-03 NOTE — Progress Notes (Signed)
Talked with pt's wife Vinnie Level at [phone] and she stated she's not on her way to pick up her husband and she explained this to him. She says pt has gone AMA before and when he gets it in his head he's leaving then he gets very hard to deal with. Wife wants a call in the morning from this nurse for an updated report.

## 2019-07-03 NOTE — Progress Notes (Signed)
PROGRESS NOTE  Andrew [name] ESP:[id number] DOB: 1928-08-15   PCP: [name], MD  Patient is from: Home. Occ use  DOA: 07/02/2019 LOS: 1  Brief Narrative / Interim history: 85 year old male with history of HTN, HLD, diastolic CHF, CKD 4, CAD, PAF on Eliquis, DM-2, DVT, hypothyroidism, remote tobacco use history, AAA status post repair and morbid obesity presenting with chills, shortness of breath, orthopnea, intermittent dry cough, mild wheeze, hypoxemia to 84%, decreased p.o. and back pain.  Symptoms started after fall 2 days prior.  In ED, febrile to 101.2.  RR 20-31.  Hemodynamically stable.  Desaturated to 88% with ambulation requiring 4 L by nasal cannula.  WBC 13.5 with bandemia.  Creatinine 1.76 (baseline).  BNP 394 (chronic).  Lactic acid 1.4.  Procalcitonin negative.  CT head without acute intracranial finding but tissue contusion over right orbit.  CXR with mild left lung base atelectasis.  Urinalysis not impressive.  DJD lumbar spine with DDD, G2 spondylolisthesis at L2/S1 but no acute abnormality.  Rapid COVID screen negative.  Send out COVID ordered.  Started on ceftriaxone and azithromycin for presumed pneumonia and admitted.  Subjective: No major events overnight of this morning.  Continues to endorse right-sided low back pain.  Denies weakness, numbness or tingling in his legs.  Denies GI or GU symptoms.  Denies dyspnea or chest pain.  Oriented x4- date.  On 2 L by nasal cannula.  Objective: Vitals:   07/03/19 0006 07/03/19 0435 07/03/19 0524 07/03/19 0828  BP: 118/78 133/76  140/83  Pulse: 80 79  72  Resp:  20    Temp: 98.5 F (36.9 C) 97.7 F (36.5 C)  (!) 97.5 F (36.4 C)  TempSrc: Oral Oral  Oral  SpO2: 94% 97%  95%  Weight:   92.5 kg   Height:       No intake or output data in the 24 hours ending 07/03/19 1435 Filed Weights   07/02/19 1325 07/03/19 0524  Weight: 84.4 kg 92.5 kg    Examination:  GENERAL: No acute distress.  Appears well.  HEENT:  MMM.  Vision and hearing grossly intact.  NECK: Supple.  No apparent JVD.  LUNGS:  No IWOB.  Fair air movement bilaterally but limited exam due to body habitus. HEART:  RRR. Heart sounds normal.  ABD: Bowel sounds present. Soft. Non tender.  Limited exam due to body habitus. MSK/EXT:  Moves extremities. No apparent deformity or edema.  Tenderness over right lumbosacral areas.  No tenderness over spinal processes. SKIN: no apparent skin lesion or wound NEURO: Awake, alert and oriented appropriately.  No gross deficit.  PSYCH: Calm. Normal affect.   I have personally reviewed the following labs and images:  Radiology Studies: No results found.  Microbiology: Recent Results (from the past 240 hour(s))  Blood Culture (routine x 2)     Status: None (Preliminary result)   Collection Time: 07/02/19  2:51 AM   Specimen: BLOOD  Result Value Ref Range Status   Specimen Description BLOOD RIGHT ANTECUBITAL  Final   Special Requests   Final    BOTTLES DRAWN AEROBIC AND ANAEROBIC Blood Culture results may not be optimal due to an inadequate volume of blood received in culture bottles   Culture   Final    NO GROWTH 1 DAY Performed at [facility], [place] [address], [place], [place] [address]    Report Status PENDING  Incomplete  Blood Culture (routine x 2)     Status: None (  Preliminary result)   Collection Time: 07/02/19  3:05 AM   Specimen: BLOOD  Result Value Ref Range Status   Specimen Description BLOOD LEFT ANTECUBITAL  Final   Special Requests   Final    BOTTLES DRAWN AEROBIC AND ANAEROBIC Blood Culture adequate volume   Culture   Final    NO GROWTH 1 DAY Performed at [facility], [address], [place], [place] [address]    Report Status PENDING  Incomplete  SARS Coronavirus 2 (CEPHEID- Performed in [place] hospital lab), Hosp Order     Status: None   Collection Time: 07/02/19  3:17 AM   Specimen: Nasopharyngeal Swab  Result Value Ref Range Status   SARS  Coronavirus 2 NEGATIVE NEGATIVE Final    Comment: (NOTE) If result is NEGATIVE SARS-CoV-2 target nucleic acids are NOT DETECTED. The SARS-CoV-2 RNA is generally detectable in upper and lower  respiratory specimens during the acute phase of infection. The lowest  concentration of SARS-CoV-2 viral copies this assay can detect is 250  copies / mL. A negative result does not preclude SARS-CoV-2 infection  and should not be used as the sole basis for treatment or other  patient management decisions.  A negative result may occur with  improper specimen collection / handling, submission of specimen other  than nasopharyngeal swab, presence of viral mutation(s) within the  areas targeted by this assay, and inadequate number of viral copies  (<250 copies / mL). A negative result must be combined with clinical  observations, patient history, and epidemiological information. If result is POSITIVE SARS-CoV-2 target nucleic acids are DETECTED. The SARS-CoV-2 RNA is generally detectable in upper and lower  respiratory specimens dur ing the acute phase of infection.  Positive  results are indicative of active infection with SARS-CoV-2.  Clinical  correlation with patient history and other diagnostic information is  necessary to determine patient infection status.  Positive results do  not rule out bacterial infection or co-infection with other viruses. If result is PRESUMPTIVE POSTIVE SARS-CoV-2 nucleic acids MAY BE PRESENT.   A presumptive positive result was obtained on the submitted specimen  and confirmed on repeat testing.  While 2019 novel coronavirus  (SARS-CoV-2) nucleic acids may be present in the submitted sample  additional confirmatory testing may be necessary for epidemiological  and / or clinical management purposes  to differentiate between  SARS-CoV-2 and other Sarbecovirus currently known to infect humans.  If clinically indicated additional testing with an alternate test   methodology [phone]) is advised. The SARS-CoV-2 RNA is generally  detectable in upper and lower respiratory sp ecimens during the acute  phase of infection. The expected result is Negative. Fact Sheet for Patients:  StrictlyIdeas.no Fact Sheet for Healthcare Providers: BankingDealers.co.za This test is not yet approved or cleared by the [place] FDA and has been authorized for detection and/or diagnosis of SARS-CoV-2 by FDA under an Emergency Use Authorization (EUA).  This EUA will remain in effect (meaning this test can be used) for the duration of the COVID-19 declaration under Section 564(b)(1) of the Act, 21 U.S.C. section 360bbb-3(b)(1), unless the authorization is terminated or revoked sooner. Performed at [facility], [place] [address], [place], [place] [address]     Sepsis Labs: Invalid input(s): PROCALCITONIN, LACTICIDVEN  Urine analysis:    Component Value Date/Time   COLORURINE STRAW (A) 07/02/2019 0259   APPEARANCEUR CLEAR 07/02/2019 0259   LABSPEC 1.009 07/02/2019 0259   PHURINE 7.0 07/02/2019 0259   GLUCOSEU  NEGATIVE 07/02/2019 0259   GLUCOSEU NEGATIVE 01/13/2016 1342   HGBUR SMALL (A) 07/02/2019 0259   HGBUR negative 02/03/2009 0912   BILIRUBINUR NEGATIVE 07/02/2019 0259   BILIRUBINUR small 07/05/2015 [place] 07/02/2019 0259   PROTEINUR 100 (A) 07/02/2019 0259   UROBILINOGEN 0.2 01/13/2016 1342   NITRITE NEGATIVE 07/02/2019 0259   LEUKOCYTESUR NEGATIVE 07/02/2019 0259    Anemia Panel: No results for input(s): VITAMINB12, FOLATE, FERRITIN, TIBC, IRON, RETICCTPCT in the last 72 hours.  Thyroid Function Tests: Recent Labs    07/02/19 1006  TSH 1.528    Lipid Profile: No results for input(s): CHOL, HDL, LDLCALC, TRIG, CHOLHDL, LDLDIRECT in the last 72 hours.  CBG: Recent Labs  Lab 07/02/19 1209 07/02/19 1717 07/02/19 2052 07/03/19 0824 07/03/19 1248  GLUCAP 182* 254*  366* 240* 219*    HbA1C: Recent Labs    07/02/19 2120  HGBA1C 7.3*    BNP (last 3 results): No results for input(s): PROBNP in the last 8760 hours.  Cardiac Enzymes: No results for input(s): CKTOTAL, CKMB, CKMBINDEX, TROPONINI in the last 168 hours.  Coagulation Profile: No results for input(s): INR, PROTIME in the last 168 hours.  Liver Function Tests: Recent Labs  Lab 07/02/19 0251  AST 32  ALT 31  ALKPHOS 70  BILITOT 1.0  PROT 7.1  ALBUMIN 4.0   No results for input(s): LIPASE, AMYLASE in the last 168 hours. No results for input(s): AMMONIA in the last 168 hours.  Basic Metabolic Panel: Recent Labs  Lab 07/02/19 0251 07/03/19 0435  NA 142 136  K 3.6 3.7  CL 101 101  CO2 27 25  GLUCOSE 151* 264*  BUN 34* 45*  CREATININE 1.76* 2.05*  CALCIUM 9.4 8.5*   GFR: Estimated Creatinine Clearance: 27.5 mL/min (A) (by C-G formula based on SCr of 2.05 mg/dL (H)).  CBC: Recent Labs  Lab 07/02/19 0251 07/03/19 0435  WBC 13.5* 11.7*  NEUTROABS 11.3*  --   HGB 13.3 11.7*  HCT 40.9 34.4*  MCV 99.3 98.3  PLT 168 132*   Procedures:  None  Microbiology summarized: 7/7-COVID-19 negative. 7/7-blood cultures negative so far. 7/7-send out COVID pending. 7/7-sputum culture pending.  Assessment & Plan: Acute hypoxemic circulatory failure: patient with respiratory symptoms and fever concerning for infectious process but no significant finding on CXR.  Reportedly hypoxemic to 84% at home per wife.  Currently on 2 L by nasal cannula.  Not on oxygen at home.  No history of COPD. -Wean oxygen as able -PRN breathing treatments -CT chest without contrast  SIRS: Without obvious source of infection.  Initially thought to be community-acquired pneumonia. -Continue ceftriaxone and azithromycin for now -CT chest as above  Recent fall at home: second in the last 6 months.  Lower back pain/Lumbosacral osteoarthritis/spondylolithiasis -No acute finding on CT head and DG  lumbar spine.  No focal neuro deficit. -Of note, patient is on multiple sedating medications-these need to be addressed -PT/OT eval  Mild COPD exacerbation with hypoxemia: -Continue IV steroid -Budesonide, Incruse Ellipta and PRN DuoNeb -Mucolytic's  Chronic diastolic CHF: Has hypoxemia, dyspnea and orthopnea per wife but does not look fluid overloaded on exam.  BNP 394 (higher than recent value but has been higher before) -Continue home Lasix -Daily weight, intake output and renal function  History of CAD: No anginal symptoms -Continue home Coreg, Imdur, hydralazine, atorvastatin and Zetia  Permanent atrial shunt: Rate and rhythm controlled -Continue home amiodarone, carvedilol and Eliquis  CKD 3: Progressing to  CKD 4? -Continue monitoring  Essential hypertension: BP was in fair range except for some elevations -Continue home cardiac meds  Fairly controlled DM-2 with hyperglycemia and CKD 3: A1c 7.3% on 7/7. -Add Levemir 5 u twice daily -Increase SSI from low to moderate -Continue CBG monitoring -Continue home statin  Hypothyroidism -Continue home Synthroid  Depression: -Continue home Flomax  DVT prophylaxis: On Eliquis Code Status: Full code Family Communication: Updated patient's wife over the phone Disposition Plan: Remains inpatient.  Patient with significant comorbidities and undifferentiated sepsis with potential decompensation. Consultants: None  Antimicrobials: Anti-infectives (From admission, onward)   Start     Dose/Rate Route Frequency Ordered Stop   07/02/19 0300  cefTRIAXone (ROCEPHIN) 2 g in sodium chloride 0.9 % 100 mL IVPB     2 g 200 mL/hr over 30 Minutes Intravenous Every 24 hours 07/02/19 0247     07/02/19 0300  azithromycin (ZITHROMAX) 500 mg in sodium chloride 0.9 % 250 mL IVPB     500 mg 250 mL/hr over 60 Minutes Intravenous Every 24 hours 07/02/19 0247        Sch Meds:  Scheduled Meds: . albuterol  2.5 mg Nebulization BID  . amiodarone   100 mg Oral Daily  . apixaban  2.5 mg Oral BID  . atorvastatin  80 mg Oral q1800  . carvedilol  12.5 mg Oral BID  . cilostazol  100 mg Oral BID  . ezetimibe  10 mg Oral Daily  . FLUoxetine  20 mg Oral Daily  . furosemide  120 mg Oral Daily   And  . furosemide  80 mg Oral q1800  . gabapentin  100 mg Oral TID  . guaiFENesin  600 mg Oral BID  . hydrALAZINE  50 mg Oral TID  . insulin aspart  0-5 Units Subcutaneous QHS  . insulin aspart  0-9 Units Subcutaneous TID WC  . isosorbide mononitrate  30 mg Oral Daily  . levothyroxine  125 mcg Oral Q0600  . potassium chloride  20 mEq Oral Daily  . umeclidinium bromide  1 puff Inhalation Daily   Continuous Infusions: . azithromycin 500 mg (07/03/19 0433)  . cefTRIAXone (ROCEPHIN)  IV 2 g (07/03/19 0246)   PRN Meds:.acetaminophen **OR** acetaminophen, albuterol, HYDROcodone-acetaminophen, ondansetron **OR** ondansetron (ZOFRAN) IV, temazepam   Barbette Mcglaun T. C[place]  If 7PM-7AM, please contact night-coverage [email or web address] Password [email or web address] 07/03/2019, 2:35 PM

## 2019-07-03 NOTE — Progress Notes (Signed)
Pt has been pleasant and back to base line mood of last night since talking with Dr.Schorr.

## 2019-07-04 ENCOUNTER — Other Ambulatory Visit (HOSPITAL_COMMUNITY): Payer: Self-pay

## 2019-07-04 DIAGNOSIS — R6510 Systemic inflammatory response syndrome (SIRS) of non-infectious origin without acute organ dysfunction: Secondary | ICD-10-CM

## 2019-07-04 DIAGNOSIS — I5032 Chronic diastolic (congestive) heart failure: Secondary | ICD-10-CM

## 2019-07-04 DIAGNOSIS — D72825 Bandemia: Secondary | ICD-10-CM

## 2019-07-04 LAB — BASIC METABOLIC PANEL
Anion gap: 11 (ref 5–15)
BUN: 48 mg/dL — ABNORMAL HIGH (ref 8–23)
CO2: 25 mmol/L (ref 22–32)
Calcium: 8.3 mg/dL — ABNORMAL LOW (ref 8.9–10.3)
Chloride: 103 mmol/L (ref 98–111)
Creatinine, Ser: 1.69 mg/dL — ABNORMAL HIGH (ref 0.61–1.24)
GFR calc Af Amer: 41 mL/min — ABNORMAL LOW (ref >60)
GFR calc non Af Amer: 35 mL/min — ABNORMAL LOW (ref >60)
Glucose, Bld: 222 mg/dL — ABNORMAL HIGH (ref 70–99)
Potassium: 3.8 mmol/L (ref 3.5–5.1)
Sodium: 139 mmol/L (ref 135–145)

## 2019-07-04 LAB — PROCALCITONIN: Procalcitonin: 0.11 ng/mL

## 2019-07-04 LAB — CBC
HCT: 33.8 % — ABNORMAL LOW (ref 39.0–52.0)
Hemoglobin: 11.4 g/dL — ABNORMAL LOW (ref 13.0–17.0)
MCH: 33.2 pg (ref 26.0–34.0)
MCHC: 33.7 g/dL (ref 30.0–36.0)
MCV: 98.5 fL (ref 80.0–100.0)
Platelets: 154 10*3/uL (ref 150–400)
RBC: 3.43 MIL/uL — ABNORMAL LOW (ref 4.22–5.81)
RDW: 12.6 % (ref 11.5–15.5)
WBC: 11.9 10*3/uL — ABNORMAL HIGH (ref 4.0–10.5)
nRBC: 0 % (ref 0.0–0.2)

## 2019-07-04 LAB — GLUCOSE, CAPILLARY
Glucose-Capillary: 248 mg/dL — ABNORMAL HIGH (ref 70–99)
Glucose-Capillary: 332 mg/dL — ABNORMAL HIGH (ref 70–99)

## 2019-07-04 MED ORDER — AZITHROMYCIN 250 MG PO TABS: 500.0000 mg | ORAL_TABLET | Freq: Every day | ORAL | Status: DC

## 2019-07-04 MED ORDER — FUROSEMIDE 10 MG/ML IJ SOLN: 80.0000 mg | Freq: Two times a day (BID) | INTRAMUSCULAR | Status: DC

## 2019-07-04 NOTE — Progress Notes (Signed)
Inpatient Rehab Admissions:  Inpatient Rehab Consult received. Note pt attempting to get out of bed to leave the hospital on arrival to unit. Per discussion with nursing and PT, pt adamantly refusing CIR.  D/C order already signed for pt to discharge home with HHPT.   Shann Medal, PT, DPT Admissions Coordinator [phone] 07/04/19  3:13 PM

## 2019-07-04 NOTE — TOC Transition Note (Addendum)
Transition of Care [facility]) - CM/SW Discharge Note   Patient Details  Name: Andrew [name] MRN: [id number] Date of Birth: Apr 30, 1928  Transition of Care [facility]) CM/SW Contact:  Zenon Mayo, RN Phone Number: 07/04/2019, 2:21 PM   Clinical Narrative:    Patient for dc to home today with HHPT/HHOT, and home oxygen and pulse oximeter.  Awaiting to hear back from [facility] for referral.  Wife states they have a rolling walker and bsc already at home. Per Butch Penny with [facility] they are not able to take patient.  NCM made referral to [facility] , they were able to take patient, NCM contacted wife, she informed her of this information, she was ok with this since [facility] would not take patient.    Final next level of care: [place] Barriers to Discharge: No Barriers Identified   Patient Goals and CMS Choice Patient states their goals for this hospitalization and ongoing recovery are:: to stay healthy CMS Medicare.gov Compare Post Acute Care list provided to:: Patient Represenative (must comment) Choice offered to / list presented to : Spouse  Discharge Placement                       Discharge Plan and Services In-house Referral: NA Discharge Planning Services: CM Consult Post Acute Care Choice: Home Health          DME Arranged: Oxygen, Pulse oximeter DME Agency: [employer] Date DME Agency Contacted: 07/04/19 Time DME Agency Contacted: [id number] Representative spoke with at DME Agency: zack HH Arranged: PT, OT [place] Agency: [place] ([place]) Date [place]: 07/04/19 Time [place]: [place] Representative spoke with at [place]: [place] ([place]) Interventions     Readmission Risk Interventions Readmission Risk Prevention Plan 07/04/2019  Transportation Screening Complete  PCP or Specialist Appt within 3-5 Days Complete  HRI or [place] Complete  Social Work Consult for [place] Planning/Counseling  Complete  Palliative Care Screening Complete  Medication Review Press photographer) Complete  Some recent data might be hidden

## 2019-07-04 NOTE — Discharge Instructions (Signed)
You will have physical therapy help you at home to gain strength  You will need oxygen at home until

## 2019-07-04 NOTE — Progress Notes (Signed)
I went to Andrew [name]' house today after his wife called to advise she would be bringing him home from the hospital today. He was not home when I arrived so I verified his medications to confirm they would be the same at discharge and refilled his pillbox. I will follow up next week.

## 2019-07-04 NOTE — Progress Notes (Signed)
SATURATION QUALIFICATIONS: (This note is used to comply with regulatory documentation for home oxygen)  Patient Saturations on Room Air at Rest = 88%  Patient Saturations on Room Air while Ambulating = 83%  Patient Saturations on 2 Liters of oxygen while Ambulating = 94%  Please briefly explain why patient needs home oxygen: 

## 2019-07-04 NOTE — Discharge Summary (Signed)
Physician Discharge Summary  Andrew [name] KKX:[id number] DOB: [birth year] DOA: 07/02/2019  PCP: [name], MD  Admit date: 07/02/2019 Discharge date: 07/04/2019  Admitted From: Home Disposition: Home  Recommendations for Outpatient Follow-up:  1. Follow up with PCP and cardiology in 1-2 weeks 2. Please obtain CBC/BMP/Mag at follow up 3. Please follow up on the following pending results: None  Home Health: PT/OT Equipment/Devices: Rolling walker, 3 in 1 commode, oxygen tank  Discharge Condition: Stable CODE STATUS: Full code  Hospital Course: 85 year old male with history of HTN, HLD, diastolic CHF, CKD 4, CAD, PAF on Eliquis, DM-2, DVT, hypothyroidism, remote tobacco use history, AAA status post repair and morbid obesity presenting with chills, shortness of breath, orthopnea, intermittent dry cough, mild wheeze, hypoxemia to 84%, decreased p.o. and back pain.  Symptoms started after fall 2 days prior.  In ED, febrile to 101.2.  RR 20-31.  Hemodynamically stable.  Desaturated to 88% with ambulation requiring 4 L by nasal cannula.  WBC 13.5 with bandemia.  Creatinine 1.76 (baseline).  BNP 394 (chronic).  Lactic acid 1.4.  Procalcitonin negative.  CT head without acute intracranial finding but tissue contusion over right orbit.  CXR with mild left lung base atelectasis.  Urinalysis not impressive.  DJD lumbar spine with DDD, G2 spondylolisthesis at L2/S1 but no acute abnormality.  Rapid COVID screen negative.  Send out COVID ordered.  Started on ceftriaxone and azithromycin for presumed pneumonia and admitted.  Patient threaten to leave AMA overnight but convinced to stay by the overnight provider.  Otherwise, he remained stable on 1 to 2 L by nasal cannula.  Cultures remained negative.  Send out COVID returned negative.  Procalcitonin remains low.  Remains afebrile.  Leukocytosis nearly resolved.  CT chest without contrast with trace bilateral pleural effusion, associated atelectasis  but no acute airspace disease.   Patient was evaluated by PT/OT who recommended CIR.  However, patient declined CIR and insisted to go home. Patient's wife was not able to convince him either. Discharged with HHPT/OT, rolling walker, 3 in 1 commode and oxygen tank   See individual problem list below for more. Discharge Diagnoses:  Acute hypoxemic respiratory failure: patient with respiratory symptoms and fever concerning for infectious process but no significant finding on CXR.  Reportedly hypoxemic to 84% at home per wife.    Remained stable on 1 to 2 L by nasal cannula but desaturated to 83% with ambulation on room air requiring 2 L to recover.   CT chest without contrast with trace pleural effusion and possible atelectasis but no airspace infiltration. -Discharged on 2 L by nasal cannula.  -Reassess oxygen requirement at follow-up.  SIRS: Without obvious source of infection.  Initially thought to be community-acquired pneumonia but procalcitonin remained negative.  Leukocytosis resolved.  Cultures remain negative.  Patient probably have some sort of viral illness. -Ceftriaxone and azithromycin 7/7-7/9  Recent fall at home: second in the last 6 months.  Lower back pain/Lumbosacral osteoarthritis/spondylolithiasis -No acute finding on CT head and DG lumbar spine.  No focal neuro deficit. -Back pain improved significantly. -Evaluated by PT/OT who recommended CIR but patient declined. -Discharge home with [facility] PT/OT and DME as above. -Of note, patient is on multiple sedating medications which could increase her risk of fall.  He is on anticoagulation for A. fib..   Chronic diastolic CHF: has hypoxemia, dyspnea and orthopnea per wife but does not look fluid overloaded on exam.  Has been lying flat in bed here. BNP 394 (  slightly higher than recent value but has been higher before) -Discharged on home Lasix. -Recommended follow-up with cardiology in 1 to 2 weeks.  History of CAD: No anginal  symptoms -Continue home Coreg, Imdur, hydralazine, atorvastatin and Zetia  Permanent atrial shunt: Rate and rhythm controlled -Continue home amiodarone, carvedilol and Eliquis  CKD 3:  Stable.  Creatinine improved -Recheck BMP at follow-up  Essential hypertension: BP was in fair range except for some elevations -Continue home cardiac meds  Fairly controlled DM-2 with hyperglycemia and CKD 3: A1c 7.3% on 7/7. -Discharged on home medications.  Hypothyroidism -Continue home Synthroid  BPH: -Continue home Flomax  Discharge Instructions  Discharge Instructions    (HEART FAILURE PATIENTS) Call MD:  Anytime you have any of the following symptoms: 1) 3 pound weight gain in 24 hours or 5 pounds in 1 week 2) shortness of breath, with or without a dry hacking cough 3) swelling in the hands, feet or stomach 4) if you have to sleep on extra pillows at night in order to breathe.   Complete by: As directed    Call MD for:  extreme fatigue   Complete by: As directed    Call MD for:  persistant dizziness or light-headedness   Complete by: As directed    Call MD for:  persistant nausea and vomiting   Complete by: As directed    Call MD for:  severe uncontrolled pain   Complete by: As directed    Call MD for:  temperature >100.4   Complete by: As directed    Diet - low sodium heart healthy   Complete by: As directed    Discharge instructions   Complete by: As directed    It has been a pleasure taking care of you! You were admitted with fever and weakness.  The test test we have done did not reveal the source of the infection.  However, your fever has resolved.  The recommendation is to go to inpatient rehab.  Since you declined this, we are discharging you with home oxygen, home health physical therapy and Occupational Therapy.  Please review medication list and take your medications as prescribed.  Please review your new medication list and the directions before you take your  medications. Please call your primary care office and cardiologist office as soon as possible to schedule hospital follow-up visit in 1 to 2 weeks.  Take care,   Increase activity slowly   Complete by: As directed      Allergies as of 07/04/2019      Reactions   Metoprolol Shortness Of Breath, Palpitations, Other (See Comments)   Heart starts racing. Shallow breathing; pt states he also get skin irritation on his legs   Metformin Hives, Swelling   On legs   Metformin And Related Nausea And Vomiting      Medication List    TAKE these medications   acetaminophen 500 MG tablet Commonly known as: TYLENOL Take 1,000 mg by mouth every 6 (six) hours as needed for mild pain or headache.   amiodarone 200 MG tablet Commonly known as: PACERONE Take 0.5 tablets (100 mg total) by mouth daily.   atorvastatin 80 MG tablet Commonly known as: LIPITOR Take 1 tablet (80 mg total) by mouth daily.   carvedilol 12.5 MG tablet Commonly known as: COREG TAKE 1 TABLET BY MOUTH TWICE DAILY. What changed: when to take this   cilostazol 100 MG tablet Commonly known as: PLETAL TAKE 1 TABLET BY MOUTH TWICE DAILY.  Eliquis 2.5 MG Tabs tablet Generic drug: apixaban TAKE 1 TABLET BY MOUTH TWICE DAILY. What changed: how much to take   ezetimibe 10 MG tablet Commonly known as: ZETIA TAKE (1) TABLET DAILY. What changed: See the new instructions.   Fluad 0.5 ML Susy Generic drug: Influenza Vac A&B Surf Ant Adj   FLUoxetine 20 MG capsule Commonly known as: PROZAC TAKE (1) CAPSULE DAILY. What changed: See the new instructions.   furosemide 80 MG tablet Commonly known as: LASIX TAKE 1&1/2 TABLETS IN THE MORNING AND 1 TABLET IN THE AFTERNOON. What changed:   how much to take  how to take this  when to take this  additional instructions   gabapentin 100 MG capsule Commonly known as: NEURONTIN Take 100 mg by mouth 3 (three) times daily.   glucose blood test strip Commonly known as:  Accu-Chek Aviva Plus CHECK BLOOD SUGAR 4 TIMES A DAY.   hydrALAZINE 50 MG tablet Commonly known as: APRESOLINE Take 1 tablet (50 mg total) by mouth 3 (three) times daily.   HYDROcodone-acetaminophen 5-325 MG tablet Commonly known as: NORCO/VICODIN Take 1 tablet by mouth daily as needed for moderate pain.   isosorbide mononitrate 30 MG 24 hr tablet Commonly known as: IMDUR TAKE 1 TABLET EACH DAY. What changed: See the new instructions.   Lancets Misc Check blood sugar 4 times a day   levothyroxine 125 MCG tablet Commonly known as: SYNTHROID Take 1 tablet (125 mcg total) by mouth daily before breakfast.   multivitamin tablet Take 1 tablet by mouth daily.   nitroGLYCERIN 0.4 MG SL tablet Commonly known as: NITROSTAT DISSOLVE 1 TABLET UNDER TONGUE AS NEEDED FOR CHEST PAIN,MAY REPEAT IN5 MINUTES FOR 2 DOSES. What changed: See the new instructions.   Potassium Chloride ER 20 MEQ Tbcr Take 20 mEq by mouth daily.   potassium chloride SA 20 MEQ tablet Commonly known as: K-DUR Take 1 tablet (20 mEq total) by mouth daily.   Spiriva HandiHaler 18 MCG inhalation capsule Generic drug: tiotropium PLACE 1 CAPSULE INTO INHALER AND INHALE ONCE DAILY. What changed: See the new instructions.   temazepam 30 MG capsule Commonly known as: RESTORIL TAKE 1 CAPSULE AT BEDTIME AS NEEDED FOR SLEEP. What changed: See the new instructions.   traMADol 50 MG tablet Commonly known as: ULTRAM Take 1 tablet (50 mg total) by mouth every 6 (six) hours as needed.            Durable Medical Equipment  (From admission, onward)         Start     Ordered   07/04/19 1337  For home use only DME oxygen  Once    Question Answer Comment  Length of Need 6 Months   Mode or (Route) Nasal cannula   Liters per Minute 2   Frequency Continuous (stationary and portable oxygen unit needed)   Oxygen conserving device Yes   Oxygen delivery system Gas      07/04/19 1337   07/04/19 1333  For home use  only DME Bedside commode  Once    Question:  Patient needs a bedside commode to treat with the following condition  Answer:  Unsteady gait   07/04/19 1332   07/04/19 1331  For home use only DME Walker rolling  Once    Comments: 5 inch wheels  Question:  Patient needs a walker to treat with the following condition  Answer:  Unsteady gait   07/04/19 1330         Follow-up  Information    [name], MD. Schedule an appointment as soon as possible for a visit in 2 week(s).   Specialty: Family Medicine Contact information: [address] [place] [address] [phone]        Larey Dresser, MD .   Specialty: Cardiology Contact information: [place]  [address] [phone]           Consultations:  None  Procedures/Studies:  2D Echo: None  Dg Lumbar Spine 2-3 Views  Result Date: 07/02/2019 CLINICAL DATA:  Lower to mid back pain, found down this morning after seizure like activity EXAM: LUMBAR SPINE - 2-3 VIEW COMPARISON:  CT abdomen and pelvis 05/31/2019 FINDINGS: Osseous demineralization. Five non-rib-bearing lumbar vertebra. Vertebral body heights maintained. Multilevel advanced degenerative disc disease changes of the lumbar spine with disc space narrowing and endplate spur formation. 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 Grade 2 spondylolisthesis L5-S1 secondary to BILATERAL spondylolysis L5. Mild degenerative retrolisthesis L2-L3, unchanged. No fracture, additional subluxation or bone destruction. Extensive atherosclerotic calcifications aorta and iliac arteries. 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 Grade 2 spondylolisthesis L5-S1 secondary to BILATERAL spondylolysis L5. Minimal chronic retrolisthesis at L2-L3. No acute abnormalities. Electronically Signed   By: Lavonia Dana M.D.   On: 07/02/2019 09:37   Ct Head Wo  Contrast  Result Date: 07/02/2019 CLINICAL DATA:  Head trauma secondary to seizure like activity at home. Soft tissue swelling over the right orbit. EXAM: CT HEAD WITHOUT CONTRAST TECHNIQUE: Contiguous axial images were obtained from the base of the skull through the vertex without intravenous contrast. COMPARISON:  CT scan dated 03/22/2016 FINDINGS: Brain: No acute hemorrhage. No mass lesion. Diffuse stable atrophy with secondary ventricular dilatation. Periventricular white matter lucency is consistent with chronic small vessel ischemic change, stable. Vascular: No hyperdense vessel or unexpected calcification. Skull: Normal. Negative for fracture or focal lesion. Sinuses/Orbits: Normal. Other: Slight soft tissue swelling adjacent to the right superior orbital rim consistent with soft tissue contusion. IMPRESSION: No acute intracranial abnormality. Soft tissue contusion over the right orbit. Electronically Signed   By: Lorriane Shire M.D.   On: 07/02/2019 05:14   Ct Chest Wo Contrast  Result Date: 07/03/2019 CLINICAL DATA:  Respiratory failure EXAM: CT CHEST WITHOUT CONTRAST TECHNIQUE: Multidetector CT imaging of the chest was performed following the standard protocol without IV contrast. COMPARISON:  01/25/2018, 03/28/2013 FINDINGS: Cardiovascular: Severe calcific aortic atherosclerosis. Extensive 3 vessel coronary artery calcifications and/or stents. Status post median sternotomy and CABG. Mediastinum/Nodes: No enlarged mediastinal, hilar, or axillary lymph nodes. Thyroid gland, trachea, and esophagus demonstrate no significant findings. Lungs/Pleura: Trace bilateral pleural effusions and associated atelectasis or consolidation. There is a 1.0 cm subsolid pulmonary nodule of the lateral segment right middle lobe (series 8, image 89), which is not changed in comparison to immediate prior examination dated 01/25/2018 but has gradually enlarged in comparison to prior dated 03/28/2013. Upper Abdomen: No acute  abnormality. Musculoskeletal: No chest wall mass or suspicious bone lesions identified. Disc degenerative disease and ankylosis of the thoracic spine. IMPRESSION: 1. Trace bilateral pleural effusions and associated atelectasis or consolidation. No acute appearing airspace disease. 2. There is a 1.0 cm subsolid pulmonary nodule of the lateral segment right middle lobe (series 8, image 89), which is not changed in comparison to immediate prior examination dated 01/25/2018 but has gradually enlarged in comparison to prior dated 03/28/2013. This is concerning for indolent malignancy such as minimally  invasive carcinoma. Recommend ongoing follow-up on an annual basis if clinically appropriate. 3.  Coronary artery disease and aortic atherosclerosis. Electronically Signed   By: Eddie Candle M.D.   On: 07/03/2019 19:25   Dg Chest Port 1 View  Result Date: 07/02/2019 CLINICAL DATA:  Shortness of breath. EXAM: PORTABLE CHEST 1 VIEW COMPARISON:  05/06/2019 FINDINGS: Post median sternotomy. Unchanged heart size and mediastinal contours with aortic atherosclerosis and tortuosity. Chronic elevation of right hemidiaphragm. No pulmonary edema. No pleural fluid or pneumothorax. Mild left lung base atelectasis, no confluent airspace disease. IMPRESSION: 1. Mild left lung base atelectasis. 2.  Aortic Atherosclerosis (ICD10-I70.0). Electronically Signed   By: Keith Rake M.D.   On: 07/02/2019 03:31      Subjective: Patient threatened to leave AMA last night but agreed to stay after discussion with him overnight provider and his wife.  He denies chest pain, dyspnea, cough or orthopnea.  Denies GI or GU symptoms.  Back pain improved significantly.  He says he likes to go home.  Declined CIR.   Discharge Exam: Vitals:   07/04/19 0858 07/04/19 1237  BP: (!) 142/85 (!) 152/84  Pulse: 79 70  Resp: 18 20  Temp: 97.8 F (36.6 C) 97.7 F (36.5 C)  SpO2: 91% 94%    GENERAL: No acute distress.  Appears well.  HEENT:  MMM.  Vision grossly intact.  Diminished hearing. NECK: Supple.  No apparent JVD. LUNGS:  No IWOB.  Fair air movement bilaterally. HEART: IR.  Normal rate. Heart sounds normal.  ABD: Bowel sounds present. Soft. Non tender.  MSK/EXT:  Moves all extremities. No apparent deformity. No edema bilaterally but chronic venous stasis SKIN: Some venous stasis of the lower extremities. NEURO: Awake, alert and oriented appropriately.  No gross deficit.  PSYCH: Calm. Normal affect.    The results of significant diagnostics from this hospitalization (including imaging, microbiology, ancillary and laboratory) are listed below for reference.     Microbiology: Recent Results (from the past 240 hour(s))  Blood Culture (routine x 2)     Status: None (Preliminary result)   Collection Time: 07/02/19  2:51 AM   Specimen: BLOOD  Result Value Ref Range Status   Specimen Description BLOOD RIGHT ANTECUBITAL  Final   Special Requests   Final    BOTTLES DRAWN AEROBIC AND ANAEROBIC Blood Culture results may not be optimal due to an inadequate volume of blood received in culture bottles   Culture   Final    NO GROWTH 2 DAYS Performed at [facility], [place] [address] [place], [place] [address]    Report Status PENDING  Incomplete  Blood Culture (routine x 2)     Status: None (Preliminary result)   Collection Time: 07/02/19  3:05 AM   Specimen: BLOOD  Result Value Ref Range Status   Specimen Description BLOOD LEFT ANTECUBITAL  Final   Special Requests   Final    BOTTLES DRAWN AEROBIC AND ANAEROBIC Blood Culture adequate volume   Culture   Final    NO GROWTH 2 DAYS Performed at [facility], [place] [address], [place], [place] [address]    Report Status PENDING  Incomplete  SARS Coronavirus 2 (CEPHEID- Performed in [facility] lab), Hosp Order     Status: None   Collection Time: 07/02/19  3:17 AM   Specimen: Nasopharyngeal Swab  Result Value Ref Range Status   SARS Coronavirus 2 NEGATIVE  NEGATIVE Final    Comment: (NOTE) If result is  NEGATIVE SARS-CoV-2 target nucleic acids are NOT DETECTED. The SARS-CoV-2 RNA is generally detectable in upper and lower  respiratory specimens during the acute phase of infection. The lowest  concentration of SARS-CoV-2 viral copies this assay can detect is 250  copies / mL. A negative result does not preclude SARS-CoV-2 infection  and should not be used as the sole basis for treatment or other  patient management decisions.  A negative result may occur with  improper specimen collection / handling, submission of specimen other  than nasopharyngeal swab, presence of viral mutation(s) within the  areas targeted by this assay, and inadequate number of viral copies  (<250 copies / mL). A negative result must be combined with clinical  observations, patient history, and epidemiological information. If result is POSITIVE SARS-CoV-2 target nucleic acids are DETECTED. The SARS-CoV-2 RNA is generally detectable in upper and lower  respiratory specimens dur ing the acute phase of infection.  Positive  results are indicative of active infection with SARS-CoV-2.  Clinical  correlation with patient history and other diagnostic information is  necessary to determine patient infection status.  Positive results do  not rule out bacterial infection or co-infection with other viruses. If result is PRESUMPTIVE POSTIVE SARS-CoV-2 nucleic acids MAY BE PRESENT.   A presumptive positive result was obtained on the submitted specimen  and confirmed on repeat testing.  While 2019 novel coronavirus  (SARS-CoV-2) nucleic acids may be present in the submitted sample  additional confirmatory testing may be necessary for epidemiological  and / or clinical management purposes  to differentiate between  SARS-CoV-2 and other Sarbecovirus currently known to infect humans.  If clinically indicated additional testing with an alternate test  methodology [phone]) is  advised. The SARS-CoV-2 RNA is generally  detectable in upper and lower respiratory sp ecimens during the acute  phase of infection. The expected result is Negative. Fact Sheet for Patients:  StrictlyIdeas.no Fact Sheet for Healthcare Providers: BankingDealers.co.za This test is not yet approved or cleared by the [place] FDA and has been authorized for detection and/or diagnosis of SARS-CoV-2 by FDA under an Emergency Use Authorization (EUA).  This EUA will remain in effect (meaning this test can be used) for the duration of the COVID-19 declaration under Section 564(b)(1) of the Act, 21 U.S.C. section 360bbb-3(b)(1), unless the authorization is terminated or revoked sooner. Performed at [facility], [place] [address] [place], [place] [address]   Novel Coronavirus,NAA,(SEND-OUT TO REF LAB - TAT 24-48 hrs); Hosp Order     Status: None   Collection Time: 07/02/19  4:57 PM   Specimen: Nasopharyngeal Swab; Respiratory  Result Value Ref Range Status   SARS-CoV-2, NAA NOT DETECTED NOT DETECTED Final    Comment: (NOTE) This test was developed and its performance characteristics determined by [employer]. This test has not been FDA cleared or approved. This test has been authorized by FDA under an Emergency Use Authorization (EUA). This test is only authorized for the duration of time the declaration that circumstances exist justifying the authorization of the emergency use of in vitro diagnostic tests for detection of SARS-CoV-2 virus and/or diagnosis of COVID-19 infection under section 564(b)(1) of the Act, 21 U.S.C. 633HLK-5(G)(2), unless the authorization is terminated or revoked sooner. When diagnostic testing is negative, the possibility of a false negative result should be considered in the context of a patient's recent exposures and the presence of clinical signs and symptoms consistent with COVID-19. An individual  without symptoms of COVID-19 and who  is not shedding SARS-CoV-2 virus would expect to have a negative (not detected) result in this assay. Performed  At: [facility] [address] [place], [place] [id number] Rush Farmer MD PF:[id number]    [place]  Final    Comment: Performed at [facility], [place] [address] [place], [place] [address]     Labs: BNP (last 3 results) Recent Labs    07/02/19 0251  BNP 165.7*   Basic Metabolic Panel: Recent Labs  Lab 07/02/19 0251 07/03/19 0435 07/04/19 0510  NA 142 136 139  K 3.6 3.7 3.8  CL 101 101 103  CO2 27 25 25   GLUCOSE 151* 264* 222*  BUN 34* 45* 48*  CREATININE 1.76* 2.05* 1.69*  CALCIUM 9.4 8.5* 8.3*   Liver Function Tests: Recent Labs  Lab 07/02/19 0251  AST 32  ALT 31  ALKPHOS 70  BILITOT 1.0  PROT 7.1  ALBUMIN 4.0   No results for input(s): LIPASE, AMYLASE in the last 168 hours. No results for input(s): AMMONIA in the last 168 hours. CBC: Recent Labs  Lab 07/02/19 0251 07/03/19 0435 07/04/19 0510  WBC 13.5* 11.7* 11.9*  NEUTROABS 11.3*  --   --   HGB 13.3 11.7* 11.4*  HCT 40.9 34.4* 33.8*  MCV 99.3 98.3 98.5  PLT 168 132* 154   Cardiac Enzymes: No results for input(s): CKTOTAL, CKMB, CKMBINDEX, TROPONINI in the last 168 hours. BNP: Invalid input(s): POCBNP CBG: Recent Labs  Lab 07/03/19 1248 07/03/19 1608 07/03/19 2036 07/04/19 0855 07/04/19 1235  GLUCAP 219* 202* 169* 248* 332*   D-Dimer No results for input(s): DDIMER in the last 72 hours. Hgb A1c Recent Labs    07/02/19 2120  HGBA1C 7.3*   Lipid Profile No results for input(s): CHOL, HDL, LDLCALC, TRIG, CHOLHDL, LDLDIRECT in the last 72 hours. Thyroid function studies Recent Labs    07/02/19 1006  TSH 1.528   Anemia work up No results for input(s): VITAMINB12, FOLATE, FERRITIN, TIBC, IRON, RETICCTPCT in the last 72 hours. Urinalysis    Component Value Date/Time   COLORURINE STRAW (A)  07/02/2019 0259   APPEARANCEUR CLEAR 07/02/2019 0259   LABSPEC 1.009 07/02/2019 0259   PHURINE 7.0 07/02/2019 0259   GLUCOSEU NEGATIVE 07/02/2019 0259   GLUCOSEU NEGATIVE 01/13/2016 1342   HGBUR SMALL (A) 07/02/2019 0259   HGBUR negative 02/03/2009 0912   BILIRUBINUR NEGATIVE 07/02/2019 0259   BILIRUBINUR small 07/05/2015 1020   KETONESUR NEGATIVE 07/02/2019 0259   PROTEINUR 100 (A) 07/02/2019 0259   UROBILINOGEN 0.2 01/13/2016 1342   NITRITE NEGATIVE 07/02/2019 0259   LEUKOCYTESUR NEGATIVE 07/02/2019 0259   Sepsis Labs Invalid input(s): PROCALCITONIN,  WBC,  LACTICIDVEN   Time coordinating discharge: 35 minutes  SIGNED:  Mercy Riding, MD  [facility] 07/04/2019, 1:37 PM  If 7PM-7AM, please contact night-coverage [email or web address] Password [email or web address]

## 2019-07-04 NOTE — Progress Notes (Signed)
Rehab Admissions Coordinator Note:  Patient was screened by Cleatrice Burke for appropriateness for an Inpatient Acute Rehab Consult per PT recommendations. OT eval pending.  At this time, we are recommending Inpatient Rehab consult IF patient would be agreeable to possible admission. Noted has history of AMA d/c and was insistent on d/c earlier this admission. Please advise.  Cleatrice Burke RN MSN 07/04/2019, 1:13 PM  I can be reached at [phone].

## 2019-07-04 NOTE — TOC Initial Note (Signed)
Transition of Care [facility]) - Initial/Assessment Note    Patient Details  Name: Andrew [name] MRN: [id number] Date of Birth: [birth year]  Transition of Care [facility] ([facility] CM/SW Contact:    Zenon Mayo, RN Phone Number: 07/04/2019, 2:12 PM  Clinical Narrative:                 From home with spouse, per pt eval rec CIR, patient has refused CIR, wants HHPT, HHOT at home.  He gave NCM permission to contact his wife.  NCM contacted wife and offered choice for [facility] services from Medicare.gov list, she chose [facility], referral made to [facility] for [place], [place], also ok to use [employer] for oxygen,  referral made to [facility] with  [employer] for home oxygen. Soc will begin 24-48 hrs post dc.  Awaiting call back from Butch Penny to make sure she can take referral for [facility] services.  Expected Discharge Plan: [place] Barriers to Discharge: No Barriers Identified   Patient Goals and CMS Choice Patient states their goals for this hospitalization and ongoing recovery are:: to stay healthy CMS Medicare.gov Compare Post Acute Care list provided to:: Patient Represenative (must comment)(spouse) Choice offered to / list presented to : Spouse  Expected Discharge Plan and Services Expected Discharge Plan: [place] In-house Referral: NA Discharge Planning Services: CM Consult Post Acute Care Choice: [place] arrangements for the past 2 months: Single Family Home Expected Discharge Date: 07/04/19               DME Arranged: Oxygen, Pulse oximeter DME Agency: [employer] Date DME Agency Contacted: 07/04/19 Time DME Agency Contacted: [phone] Representative spoke with at DME Agency: [place] Arranged: PT, OT [place] Agency: [place] ([place]) Date [place]: 07/04/19 Time [place]: 22 Representative spoke with at [place]: Butch Penny  Prior Living Arrangements/Services Living arrangements for the past 2 months: Single Family Home Lives with:: Spouse Patient  language and need for interpreter reviewed:: Yes Do you feel safe going back to the place where you live?: Yes      Need for Family Participation in Patient Care: Yes (Comment) Care giver support system in place?: Yes (comment)   Criminal Activity/Legal Involvement Pertinent to Current Situation/Hospitalization: No - Comment as needed  Activities of Daily Living Home Assistive Devices/Equipment: None ADL Screening (condition at time of admission) Patient's cognitive ability adequate to safely complete daily activities?: Yes Is the patient deaf or have difficulty hearing?: No Does the patient have difficulty seeing, even when wearing glasses/contacts?: No Does the patient have difficulty concentrating, remembering, or making decisions?: No Patient able to express need for assistance with ADLs?: Yes Does the patient have difficulty dressing or bathing?: Yes Independently performs ADLs?: Yes (appropriate for developmental age) Does the patient have difficulty walking or climbing stairs?: Yes Weakness of Legs: Both Weakness of Arms/Hands: None  Permission Sought/Granted Permission sought to share information with : Family Supports Permission granted to share information with : Yes, Verbal Permission Granted  Share Information with NAME: Mrs Golay  Permission granted to share info w AGENCY: yes  Permission granted to share info w Relationship: spouse     Emotional Assessment Appearance:: Appears stated age Attitude/Demeanor/Rapport: [place] Affect (typically observed): Appropriate Orientation: : Oriented to Self, Oriented to Place, Oriented to  Time, Oriented to Situation Alcohol / Substance Use: Not Applicable Psych Involvement: No (comment)  Admission diagnosis:  Back pain [M54.9] SOB (shortness of breath) [R06.02] Hypoxia [R09.02] Patient Active  Problem List   Diagnosis Date Noted  . Acute respiratory failure ([place]) 07/02/2019  . Fall at home, initial encounter   . Chronic  respiratory failure with hypoxia ([place]) 09/16/2018  . Bilateral recurrent inguinal hernias 11/28/2017  . Calf pain 05/25/2016  . Carotid artery stenosis 01/12/2016  . Chronic systolic CHF (congestive heart failure) ([place]) 12/02/2015  . Cerebellar infarct ([place]) 10/16/2015  . 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 ([place]) 10/16/2015  . PVD (peripheral vascular disease) ([place])   . Hereditary and idiopathic peripheral neuropathy 10/02/2015  . Peripelvic (lymphatic) cyst   . Pernicious anemia 07/15/2015  . Bilateral renal cysts 07/12/2015  . Lung nodule, 48mm RML CT 07/12/15 07/12/2015  . Constipation 07/12/2015  . Kidney stone on right side 07/12/2015  . Stage 3 chronic kidney disease ([place]) 06/14/2015  . Chronic diastolic CHF (congestive heart failure) ([place]) 05/28/2015  . Paroxysmal atrial fibrillation ([place]) 05/28/2015  . AAA (abdominal aortic aneurysm) without rupture ([place]) 09/30/2014  . Left-sided low back pain without sciatica 07/21/2014  . Cardiomyopathy, ischemic 05/23/2014  . DM (diabetes mellitus), type 2 with renal complications ([place]) 82/99/3716  . CORONARY ATHEROSCLEROSIS NATIVE CORONARY ARTERY 11/09/2010  . Depression, major, single episode, complete remission ([place]) 03/18/2010  . Hypothyroidism 02/03/2009  . Osteoarthritis 05/27/2008  . Hyperlipidemia associated with type 2 diabetes mellitus ([place]) 06/14/2007  . Hypertension associated with diabetes ([place]) 06/14/2007  . GERD 06/14/2007   PCP:  [name], MD Pharmacy:   [facility], [place] [address] Phone: [phone] Fax: [phone]     Social Determinants of Health (SDOH) Interventions    Readmission Risk Interventions Readmission Risk Prevention Plan 07/04/2019  Transportation Screening Complete  PCP or Specialist Appt within 3-5 Days Complete  HRI or [place] Complete  Social Work Consult for [place]  Planning/Counseling Complete  Palliative Care Screening Complete  Medication Review Press photographer) Complete  Some recent data might be hidden

## 2019-07-04 NOTE — Progress Notes (Signed)
Physical Therapy Evaluation Patient Details Name: Andrew [name] MRN: [id number] DOB: 1928/10/04 Today's Date: 07/04/2019   History of Present Illness  Pt adm with chills and back pain and hypoxemia. Covid neg x 2. PMH - HTN, HLD, diastolic CHF, CAD, PAF on Eliquis, DM type II, DVT, hypothyroidism, remote history of tobacco use, and AAA s/p repair  Clinical Impression  Pt presents to PT with unsteady gait and history of recent falls. Pt currently requiring supplemental O2. Spoke with pt's wife who feels he needs to be more independent for her to manage at home. The addition of home O2 will incr his fall risk. Pt also in multilevel home with bed/bath upstairs. Recommend short CIR stay to allow pt to return home with wife.     Follow Up Recommendations CIR    Equipment Recommendations  Rolling walker with 5" wheels    Recommendations for Other Services       Precautions / Restrictions Precautions Precautions: Fall Restrictions Weight Bearing Restrictions: No      Mobility  Bed Mobility Overal bed mobility: Needs Assistance Bed Mobility: Supine to Sit;Sit to Supine     Supine to sit: Min assist Sit to supine: Min guard   General bed mobility comments: Assist to elevate trunk into sitting  Transfers Overall transfer level: Needs assistance Equipment used: Rolling walker (2 wheeled);None Transfers: Sit to/from Stand Sit to Stand: Min assist         General transfer comment: Assist for balance and to bring hips up  Ambulation/Gait Ambulation/Gait assistance: Min assist Gait Distance (Feet): 80 Feet(x 2) Assistive device: Rolling walker (2 wheeled);None Gait Pattern/deviations: Step-through pattern;Decreased stride length;Drifts right/left Gait velocity: decr Gait velocity interpretation: 1.31 - 2.62 ft/sec, indicative of limited community ambulator General Gait Details: Unsteady gait with assist for balance. When using walker verbal cues to stay closer to the walker.  Amb on RA with SpO2 to 83%. SpO2 93% on 2L of O2  Stairs            Wheelchair Mobility    Modified Rankin (Stroke Patients Only)       Balance Overall balance assessment: Needs assistance Sitting-balance support: No upper extremity supported;Feet supported Sitting balance-Leahy Scale: Good     Standing balance support: During functional activity;No upper extremity supported Standing balance-Leahy Scale: Fair                               Pertinent Vitals/Pain Pain Assessment: No/denies pain    Home Living Family/patient expects to be discharged to:: Private residence Living Arrangements: Spouse/significant other Available Help at Discharge: Family Type of Home: House Home Access: Stairs to enter Entrance Stairs-Rails: Right;Left;Can reach both Technical brewer of Steps: 6 Home Layout: Bed/bath upstairs;Multi-level Home Equipment: Cane - single point      Prior Function Level of Independence: Independent with assistive device(s)         Comments: amb with cane at times. History of falls     Hand Dominance   Dominant Hand: Right    Extremity/Trunk Assessment   Upper Extremity Assessment Upper Extremity Assessment: Defer to OT evaluation    Lower Extremity Assessment Lower Extremity Assessment: Generalized weakness       Communication   Communication: HOH  Cognition Arousal/Alertness: Awake/alert Behavior During Therapy: WFL for tasks assessed/performed Overall Cognitive Status: History of cognitive impairments - at baseline  General Comments: pt with decr awareness of safety. Pt tends to joke and laugh to cover deficits      General Comments      Exercises     Assessment/Plan    PT Assessment Patient needs continued PT services  PT Problem List Decreased strength;Decreased activity tolerance;Decreased balance;Decreased mobility;Decreased safety awareness       PT  Treatment Interventions DME instruction;Gait training;Stair training;Functional mobility training;Therapeutic activities;Therapeutic exercise;Balance training;Patient/family education    PT Goals (Current goals can be found in the Care Plan section)  Acute Rehab PT Goals Patient Stated Goal: go home PT Goal Formulation: With patient Time For Goal Achievement: 07/11/19 Potential to Achieve Goals: Good    Frequency Min 3X/week   Barriers to discharge Inaccessible home environment Pt in multilevel home    Co-evaluation               AM-PAC PT "6 Clicks" Mobility  Outcome Measure Help needed turning from your back to your side while in a flat bed without using bedrails?: A Little Help needed moving from lying on your back to sitting on the side of a flat bed without using bedrails?: A Little Help needed moving to and from a bed to a chair (including a wheelchair)?: A Little Help needed standing up from a chair using your arms (e.g., wheelchair or bedside chair)?: A Little Help needed to walk in hospital room?: A Little Help needed climbing 3-5 steps with a railing? : A Little 6 Click Score: 18    End of Session Equipment Utilized During Treatment: Gait belt;Oxygen Activity Tolerance: Patient tolerated treatment well Patient left: in bed;with call bell/phone within reach;with bed alarm set Nurse Communication: Mobility status PT Visit Diagnosis: Unsteadiness on feet (R26.81);Muscle weakness (generalized) (M62.81);History of falling (Z91.81)    Time: 1106-1150 PT Time Calculation (min) (ACUTE ONLY): 44 min   Charges:   PT Evaluation $PT Eval Moderate Complexity: 1 Mod PT Treatments $Gait Training: 23-37 mins        [place] Pager [phone] Office [place] 07/04/2019, 12:23 PM

## 2019-07-05 ENCOUNTER — Telehealth: Payer: Self-pay

## 2019-07-05 NOTE — Telephone Encounter (Signed)
LM for patient to return call for TCM. 

## 2019-07-07 LAB — CULTURE, BLOOD (ROUTINE X 2)
Culture: NO GROWTH
Culture: NO GROWTH
Special Requests: ADEQUATE

## 2019-07-08 ENCOUNTER — Telehealth: Payer: Self-pay

## 2019-07-08 NOTE — Telephone Encounter (Signed)
LM to return call for TCM. 

## 2019-07-11 ENCOUNTER — Other Ambulatory Visit (HOSPITAL_COMMUNITY): Payer: Self-pay | Admitting: Cardiology

## 2019-07-11 ENCOUNTER — Other Ambulatory Visit (HOSPITAL_COMMUNITY): Payer: Self-pay

## 2019-07-11 MED ORDER — CILOSTAZOL 100 MG PO TABS: 100.0000 mg | ORAL_TABLET | Freq: Two times a day (BID) | ORAL | 3 refills | Status: DC

## 2019-07-11 NOTE — Progress Notes (Signed)
Paramedicine Encounter    Patient ID: DHEERAJ HAIL, male    DOB: 1928-12-15, 85 y.o.   MRN: [id number]   Patient Care Team: [name], MD as PCP - General (Family Medicine) Larey Dresser, MD as PCP - Advanced Heart Failure (Cardiology) Larey Dresser, MD as Consulting Physician (Cardiology) Jorge Ny, LCSW as Social Worker (Licensed Clinical Social Worker)  Patient Active Problem List   Diagnosis Date Noted  . Acute respiratory failure ([place]) 07/02/2019  . Fall at home, initial encounter   . Chronic respiratory failure with hypoxia ([place]) 09/16/2018  . Bilateral recurrent inguinal hernias 11/28/2017  . Calf pain 05/25/2016  . Carotid artery stenosis 01/12/2016  . Chronic systolic CHF (congestive heart failure) ([place]) 12/02/2015  . Cerebellar infarct ([place]) 10/16/2015  . 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 ([place]) 10/16/2015  . PVD (peripheral vascular disease) ([place])   . Hereditary and idiopathic peripheral neuropathy 10/02/2015  . Peripelvic (lymphatic) cyst   . Pernicious anemia 07/15/2015  . Bilateral renal cysts 07/12/2015  . Lung nodule, 58mm RML CT 07/12/15 07/12/2015  . Constipation 07/12/2015  . Kidney stone on right side 07/12/2015  . Stage 3 chronic kidney disease ([place]) 06/14/2015  . Chronic diastolic CHF (congestive heart failure) ([place]) 05/28/2015  . Paroxysmal atrial fibrillation ([place]) 05/28/2015  . AAA (abdominal aortic aneurysm) without rupture ([place]) 09/30/2014  . Left-sided low back pain without sciatica 07/21/2014  . Cardiomyopathy, ischemic 05/23/2014  . DM (diabetes mellitus), type 2 with renal complications ([place]) 61/44/3154  . CORONARY ATHEROSCLEROSIS NATIVE CORONARY ARTERY 11/09/2010  . Depression, major, single episode, complete remission ([place]) 03/18/2010  . Hypothyroidism 02/03/2009  . Osteoarthritis 05/27/2008  . Hyperlipidemia associated with type 2 diabetes mellitus ([place]) 06/14/2007  . Hypertension associated with diabetes  ([place]) 06/14/2007  . GERD 06/14/2007    Current Outpatient Medications:  .  acetaminophen (TYLENOL) 500 MG tablet, Take 1,000 mg by mouth every 6 (six) hours as needed for mild pain or headache., Disp: , Rfl:  .  amiodarone (PACERONE) 200 MG tablet, Take 0.5 tablets (100 mg total) by mouth daily., Disp: 45 tablet, Rfl: 3 .  atorvastatin (LIPITOR) 80 MG tablet, Take 1 tablet (80 mg total) by mouth daily., Disp: 30 tablet, Rfl: 6 .  carvedilol (COREG) 12.5 MG tablet, TAKE 1 TABLET BY MOUTH TWICE DAILY. (Patient taking differently: Take 12.5 mg by mouth 2 (two) times daily with a meal. ), Disp: 180 tablet, Rfl: 3 .  ELIQUIS 2.5 MG TABS tablet, TAKE 1 TABLET BY MOUTH TWICE DAILY. (Patient taking differently: Take 2.5 mg by mouth 2 (two) times daily. ), Disp: 180 tablet, Rfl: 0 .  ezetimibe (ZETIA) 10 MG tablet, TAKE (1) TABLET DAILY. (Patient taking differently: Take 10 mg by mouth daily. ), Disp: 90 tablet, Rfl: 0 .  FLUoxetine (PROZAC) 20 MG capsule, TAKE (1) CAPSULE DAILY. (Patient taking differently: Take 20 mg by mouth daily. ), Disp: 90 capsule, Rfl: 0 .  furosemide (LASIX) 80 MG tablet, TAKE 1&1/2 TABLETS IN THE MORNING AND 1 TABLET IN THE AFTERNOON. (Patient taking differently: Take 80-120 mg by mouth See admin instructions. TAKE 1&1/2 TABLETS (120mg ) IN THE MORNING AND 1 TABLET (80mg )  IN THE AFTERNOON.), Disp: 225 tablet, Rfl: 0 .  gabapentin (NEURONTIN) 100 MG capsule, Take 100 mg by mouth 3 (three) times daily., Disp: , Rfl:  .  hydrALAZINE (APRESOLINE) 50 MG tablet, Take 1 tablet (50 mg total) by mouth 3 (three) times daily., Disp: 270 tablet,  Rfl: 1 .  HYDROcodone-acetaminophen (NORCO/VICODIN) 5-325 MG tablet, Take 1 tablet by mouth daily as needed for moderate pain. , Disp: , Rfl:  .  isosorbide mononitrate (IMDUR) 30 MG 24 hr tablet, TAKE 1 TABLET EACH DAY. (Patient taking differently: Take 30 mg by mouth daily. ), Disp: 90 tablet, Rfl: 0 .  levothyroxine (SYNTHROID, LEVOTHROID) 125 MCG  tablet, Take 1 tablet (125 mcg total) by mouth daily before breakfast., Disp: 90 tablet, Rfl: 1 .  Multiple Vitamin (MULTIVITAMIN) tablet, Take 1 tablet by mouth daily., Disp: , Rfl:  .  nitroGLYCERIN (NITROSTAT) 0.4 MG SL tablet, DISSOLVE 1 TABLET UNDER TONGUE AS NEEDED FOR CHEST PAIN,MAY REPEAT IN5 MINUTES FOR 2 DOSES. (Patient taking differently: Place 0.4 mg under the tongue every 5 (five) minutes as needed for chest pain. ), Disp: 25 tablet, Rfl: 3 .  potassium chloride SA (K-DUR) 20 MEQ tablet, Take 1 tablet (20 mEq total) by mouth daily., Disp: 90 tablet, Rfl: 1 .  SPIRIVA HANDIHALER 18 MCG inhalation capsule, PLACE 1 CAPSULE INTO INHALER AND INHALE ONCE DAILY. (Patient taking differently: Place 18 mcg into inhaler and inhale daily. ), Disp: 90 capsule, Rfl: 0 .  temazepam (RESTORIL) 30 MG capsule, TAKE 1 CAPSULE AT BEDTIME AS NEEDED FOR SLEEP. (Patient taking differently: Take 30 mg by mouth at bedtime as needed for sleep. ), Disp: 30 capsule, Rfl: 0 .  cilostazol (PLETAL) 100 MG tablet, Take 1 tablet (100 mg total) by mouth 2 (two) times daily., Disp: 180 tablet, Rfl: 3 .  glucose blood (ACCU-CHEK AVIVA PLUS) test strip, CHECK BLOOD SUGAR 4 TIMES A DAY. (Patient not taking: Reported on 05/23/2019), Disp: 100 each, Rfl: 11 .  Influenza Vac A&B Surf Ant Adj (FLUAD) 0.5 ML SUSY, , Disp: , Rfl:  .  Lancets MISC, Check blood sugar 4 times a day (Patient not taking: Reported on 06/13/2019), Disp: 100 each, Rfl: 11 .  Potassium Chloride ER 20 MEQ TBCR, Take 20 mEq by mouth daily., Disp: 30 tablet, Rfl: 3 .  traMADol (ULTRAM) 50 MG tablet, Take 1 tablet (50 mg total) by mouth every 6 (six) hours as needed. (Patient not taking: Reported on 07/04/2019), Disp: 12 tablet, Rfl: 0 Allergies  Allergen Reactions  . Metoprolol Shortness Of Breath, Palpitations and Other (See Comments)    Heart starts racing. Shallow breathing; pt states he also get skin irritation on his legs  . Metformin Hives and Swelling     On legs  . Metformin And Related Nausea And Vomiting      Social History   Socioeconomic History  . Marital status: Married    Spouse name: Not on file  . Number of children: Not on file  . Years of education: Not on file  . Highest education level: Not on file  Occupational History  . 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 Financial resource strain: Not on file  . Food insecurity    Worry: Not on file    Inability: Not on file  . Transportation needs    Medical: Not on file    Non-medical: Not on file  Tobacco Use  . Smoking status: Former Smoker    Packs/day: 1.50    Years: 35.00    Pack years: 52.50    Types: Cigarettes  . Smokeless tobacco: Never Used  . Tobacco comment: quit atleast 25 yrs ago, per pt  Substance and Sexual Activity  . Alcohol use: Yes    Alcohol/week: 3.0 standard drinks  Types: 3 Shots of liquor per week    Comment: socially  . Drug use: No  . Sexual activity: Yes  Lifestyle  . Physical activity    Days per week: Not on file    Minutes per session: Not on file  . Stress: Not on file  Relationships  . Social Herbalist on phone: Not on file    Gets together: Not on file    Attends religious service: Not on file    Active member of club or organization: Not on file    Attends meetings of clubs or organizations: Not on file    Relationship status: Not on file  . Intimate partner violence    Fear of current or ex partner: Not on file    Emotionally abused: Not on file    Physically abused: Not on file    Forced sexual activity: Not on file  Other Topics Concern  . Not on file  Social History Narrative   Pt lives in [place] with spouse.   Retired from [employer].  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    Physical Exam      Future Appointments  Date Time Provider [place]  07/23/2019 10:00 AM [name], MD [facility]  07/30/2019  1:00 PM [place] ECHO OP 1  [facility]  07/30/2019  2:00 PM Larey Dresser, MD [facility] None  08/27/2019  2:45 PM Gardiner Barefoot, DPM [facility]    BP (!) 141/68 (BP Location: Left Arm, Patient Position: Sitting, Cuff Size: Normal)   Pulse 63   Resp 16   Wt 188 lb (85.3 kg)   SpO2 93%   BMI 28.59 kg/m   Weight yesterday- Did not weigh Last visit weight- not applicable  Mr Bearse was seen at home today and reported feeling well. He denied chest pain, SOB, headache, dizziness, orthopnea, cough or fever since getting out of the hospital last week. He reported being compliant with his medications and his weight has been generally stable. His medications were verified and his pillbox was refilled. I will follow up next week.   [name], EMT 07/11/19  ACTION: Home visit completed Next visit planned for 1 week

## 2019-07-18 ENCOUNTER — Other Ambulatory Visit (HOSPITAL_COMMUNITY): Payer: Self-pay

## 2019-07-18 NOTE — Progress Notes (Signed)
Paramedicine Encounter    Patient ID: Andrew [name], male    DOB: 1928/05/01, 85 y.o.   MRN: [id number]   Patient Care Team: [name], MD as PCP - General (Family Medicine) Larey Dresser, MD as PCP - Advanced Heart Failure (Cardiology) Larey Dresser, MD as Consulting Physician (Cardiology) Jorge Ny, LCSW as Social Worker (Licensed Clinical Social Worker)  Patient Active Problem List   Diagnosis Date Noted  . Acute respiratory failure ([place]) 07/02/2019  . Fall at home, initial encounter   . Chronic respiratory failure with hypoxia ([place]) 09/16/2018  . Bilateral recurrent inguinal hernias 11/28/2017  . Calf pain 05/25/2016  . Carotid artery stenosis 01/12/2016  . Chronic systolic CHF (congestive heart failure) ([place]) 12/02/2015  . Cerebellar infarct ([place]) 10/16/2015  . 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 ([place]) 10/16/2015  . PVD (peripheral vascular disease) ([place])   . Hereditary and idiopathic peripheral neuropathy 10/02/2015  . Peripelvic (lymphatic) cyst   . Pernicious anemia 07/15/2015  . Bilateral renal cysts 07/12/2015  . Lung nodule, 26mm RML CT 07/12/15 07/12/2015  . Constipation 07/12/2015  . Kidney stone on right side 07/12/2015  . Stage 3 chronic kidney disease ([place]) 06/14/2015  . Chronic diastolic CHF (congestive heart failure) ([place]) 05/28/2015  . Paroxysmal atrial fibrillation ([place]) 05/28/2015  . AAA (abdominal aortic aneurysm) without rupture ([place]) 09/30/2014  . Left-sided low back pain without sciatica 07/21/2014  . Cardiomyopathy, ischemic 05/23/2014  . DM (diabetes mellitus), type 2 with renal complications ([place]) 72/53/6644  . CORONARY ATHEROSCLEROSIS NATIVE CORONARY ARTERY 11/09/2010  . Depression, major, single episode, complete remission ([place]) 03/18/2010  . Hypothyroidism 02/03/2009  . Osteoarthritis 05/27/2008  . Hyperlipidemia associated with type 2 diabetes mellitus ([place]) 06/14/2007  . Hypertension associated with diabetes  ([place]) 06/14/2007  . GERD 06/14/2007    Current Outpatient Medications:  .  acetaminophen (TYLENOL) 500 MG tablet, Take 1,000 mg by mouth every 6 (six) hours as needed for mild pain or headache., Disp: , Rfl:  .  amiodarone (PACERONE) 200 MG tablet, Take 0.5 tablets (100 mg total) by mouth daily., Disp: 45 tablet, Rfl: 3 .  atorvastatin (LIPITOR) 80 MG tablet, Take 1 tablet (80 mg total) by mouth daily., Disp: 30 tablet, Rfl: 6 .  carvedilol (COREG) 12.5 MG tablet, TAKE 1 TABLET BY MOUTH TWICE DAILY. (Patient taking differently: Take 12.5 mg by mouth 2 (two) times daily with a meal. ), Disp: 180 tablet, Rfl: 3 .  cilostazol (PLETAL) 100 MG tablet, Take 1 tablet (100 mg total) by mouth 2 (two) times daily., Disp: 180 tablet, Rfl: 3 .  ELIQUIS 2.5 MG TABS tablet, TAKE 1 TABLET BY MOUTH TWICE DAILY. (Patient taking differently: Take 2.5 mg by mouth 2 (two) times daily. ), Disp: 180 tablet, Rfl: 0 .  ezetimibe (ZETIA) 10 MG tablet, TAKE (1) TABLET DAILY. (Patient taking differently: Take 10 mg by mouth daily. ), Disp: 90 tablet, Rfl: 0 .  FLUoxetine (PROZAC) 20 MG capsule, TAKE (1) CAPSULE DAILY. (Patient taking differently: Take 20 mg by mouth daily. ), Disp: 90 capsule, Rfl: 0 .  furosemide (LASIX) 80 MG tablet, TAKE 1&1/2 TABLETS IN THE MORNING AND 1 TABLET IN THE AFTERNOON. (Patient taking differently: Take 80-120 mg by mouth See admin instructions. TAKE 1&1/2 TABLETS (120mg ) IN THE MORNING AND 1 TABLET (80mg )  IN THE AFTERNOON.), Disp: 225 tablet, Rfl: 0 .  gabapentin (NEURONTIN) 100 MG capsule, Take 100 mg by mouth 3 (three) times daily., Disp: ,  Rfl:  .  glucose blood (ACCU-CHEK AVIVA PLUS) test strip, CHECK BLOOD SUGAR 4 TIMES A DAY., Disp: 100 each, Rfl: 11 .  hydrALAZINE (APRESOLINE) 50 MG tablet, Take 1 tablet (50 mg total) by mouth 3 (three) times daily., Disp: 270 tablet, Rfl: 1 .  HYDROcodone-acetaminophen (NORCO/VICODIN) 5-325 MG tablet, Take 1 tablet by mouth daily as needed for moderate  pain. , Disp: , Rfl:  .  isosorbide mononitrate (IMDUR) 30 MG 24 hr tablet, TAKE 1 TABLET EACH DAY. (Patient taking differently: Take 30 mg by mouth daily. ), Disp: 90 tablet, Rfl: 0 .  levothyroxine (SYNTHROID, LEVOTHROID) 125 MCG tablet, Take 1 tablet (125 mcg total) by mouth daily before breakfast., Disp: 90 tablet, Rfl: 1 .  Multiple Vitamin (MULTIVITAMIN) tablet, Take 1 tablet by mouth daily., Disp: , Rfl:  .  nitroGLYCERIN (NITROSTAT) 0.4 MG SL tablet, DISSOLVE 1 TABLET UNDER TONGUE AS NEEDED FOR CHEST PAIN,MAY REPEAT IN5 MINUTES FOR 2 DOSES. (Patient taking differently: Place 0.4 mg under the tongue every 5 (five) minutes as needed for chest pain. ), Disp: 25 tablet, Rfl: 3 .  potassium chloride SA (K-DUR) 20 MEQ tablet, Take 1 tablet (20 mEq total) by mouth daily., Disp: 90 tablet, Rfl: 1 .  SPIRIVA HANDIHALER 18 MCG inhalation capsule, PLACE 1 CAPSULE INTO INHALER AND INHALE ONCE DAILY. (Patient taking differently: Place 18 mcg into inhaler and inhale daily. ), Disp: 90 capsule, Rfl: 0 .  temazepam (RESTORIL) 30 MG capsule, TAKE 1 CAPSULE AT BEDTIME AS NEEDED FOR SLEEP. (Patient taking differently: Take 30 mg by mouth at bedtime as needed for sleep. ), Disp: 30 capsule, Rfl: 0 .  Influenza Vac A&B Surf Ant Adj (FLUAD) 0.5 ML SUSY, , Disp: , Rfl:  .  Lancets MISC, Check blood sugar 4 times a day (Patient not taking: Reported on 06/13/2019), Disp: 100 each, Rfl: 11 .  Potassium Chloride ER 20 MEQ TBCR, Take 20 mEq by mouth daily., Disp: 30 tablet, Rfl: 3 .  traMADol (ULTRAM) 50 MG tablet, Take 1 tablet (50 mg total) by mouth every 6 (six) hours as needed. (Patient not taking: Reported on 07/04/2019), Disp: 12 tablet, Rfl: 0 Allergies  Allergen Reactions  . Metoprolol Shortness Of Breath, Palpitations and Other (See Comments)    Heart starts racing. Shallow breathing; pt states he also get skin irritation on his legs  . Metformin Hives and Swelling    On legs  . Metformin And Related Nausea And  Vomiting      Social History   Socioeconomic History  . Marital status: Married    Spouse name: Not on file  . Number of children: Not on file  . Years of education: Not on file  . Highest education level: Not on file  Occupational History  . 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 Financial resource strain: Not on file  . Food insecurity    Worry: Not on file    Inability: Not on file  . Transportation needs    Medical: Not on file    Non-medical: Not on file  Tobacco Use  . Smoking status: Former Smoker    Packs/day: 1.50    Years: 35.00    Pack years: 52.50    Types: Cigarettes  . Smokeless tobacco: Never Used  . Tobacco comment: quit atleast 25 yrs ago, per pt  Substance and Sexual Activity  . Alcohol use: Yes    Alcohol/week: 3.0 standard drinks    Types: 3 Shots of liquor per  week    Comment: socially  . Drug use: No  . Sexual activity: Yes  Lifestyle  . Physical activity    Days per week: Not on file    Minutes per session: Not on file  . Stress: Not on file  Relationships  . Social Herbalist on phone: Not on file    Gets together: Not on file    Attends religious service: Not on file    Active member of club or organization: Not on file    Attends meetings of clubs or organizations: Not on file    Relationship status: Not on file  . Intimate partner violence    Fear of current or ex partner: Not on file    Emotionally abused: Not on file    Physically abused: Not on file    Forced sexual activity: Not on file  Other Topics Concern  . Not on file  Social History Narrative   Pt lives in [place] with spouse.   Retired from [employer].  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    Physical Exam Cardiovascular:     Rate and Rhythm: Normal rate and regular rhythm.     Pulses: Normal pulses.  Pulmonary:     Effort: Pulmonary effort is normal.     Breath sounds: Normal breath sounds.   Musculoskeletal: Normal range of motion.     Right lower leg: No edema.     Left lower leg: No edema.  Skin:    General: Skin is warm and dry.     Capillary Refill: Capillary refill takes less than 2 seconds.  Neurological:     Mental Status: He is alert and oriented to person, place, and time.  Psychiatric:        Mood and Affect: Mood normal.         Future Appointments  Date Time Provider [place]  07/30/2019  1:00 PM [facility] OP 1 [facility]  07/30/2019  2:00 PM Larey Dresser, MD [facility] None  08/01/2019  4:00 PM [name], MD [facility]  08/27/2019  2:45 PM Gardiner Barefoot, DPM [facility]    BP (!) 137/58 (BP Location: Left Arm, Patient Position: Sitting, Cuff Size: Normal)   Pulse 71   Resp 16   Wt 188 lb (85.3 kg)   SpO2 95%   BMI 28.59 kg/m   Weight yesterday- 187 lb Last visit weight- 188 lb  Mr Sculley was seen at home today and reported feeling well. He denied chest pain, SOB, headache, dizziness, orthopnea, cough or fever since our last visit. He stated he has been compliant with his medications and his weight has been stable. His medications were verified and his pillbox was refilled. I spoke with [facility] about having an oxygen tank regulator and they advised it would be delivered in 3-5 business days. Nothing further was needed at this time. I will follow up next week.   [name], EMT 07/18/19  ACTION: Home visit completed Next visit planned for 1 week

## 2019-07-22 ENCOUNTER — Inpatient Hospital Stay: Payer: Medicare Other | Admitting: Family Medicine

## 2019-07-23 ENCOUNTER — Inpatient Hospital Stay: Payer: Medicare Other | Admitting: Family Medicine

## 2019-07-25 ENCOUNTER — Other Ambulatory Visit (HOSPITAL_COMMUNITY): Payer: Self-pay

## 2019-07-25 NOTE — Progress Notes (Signed)
Paramedicine Encounter    Patient ID: Andrew [name], male    DOB: 1928-10-18, 85 y.o.   MRN: [id number]   Patient Care Team: [name], MD as PCP - General (Family Medicine) Larey Dresser, MD as PCP - Advanced Heart Failure (Cardiology) Larey Dresser, MD as Consulting Physician (Cardiology) Jorge Ny, LCSW as Social Worker (Licensed Clinical Social Worker)  Patient Active Problem List   Diagnosis Date Noted  . Acute respiratory failure ([place]) 07/02/2019  . Fall at home, initial encounter   . Chronic respiratory failure with hypoxia ([place]) 09/16/2018  . Bilateral recurrent inguinal hernias 11/28/2017  . Calf pain 05/25/2016  . Carotid artery stenosis 01/12/2016  . Chronic systolic CHF (congestive heart failure) ([place]) 12/02/2015  . Cerebellar infarct ([place]) 10/16/2015  . 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 ([place]) 10/16/2015  . PVD (peripheral vascular disease) ([place])   . Hereditary and idiopathic peripheral neuropathy 10/02/2015  . Peripelvic (lymphatic) cyst   . Pernicious anemia 07/15/2015  . Bilateral renal cysts 07/12/2015  . Lung nodule, 13mm RML CT 07/12/15 07/12/2015  . Constipation 07/12/2015  . Kidney stone on right side 07/12/2015  . Stage 3 chronic kidney disease ([place]) 06/14/2015  . Chronic diastolic CHF (congestive heart failure) ([place]) 05/28/2015  . Paroxysmal atrial fibrillation ([place]) 05/28/2015  . AAA (abdominal aortic aneurysm) without rupture ([place]) 09/30/2014  . Left-sided low back pain without sciatica 07/21/2014  . Cardiomyopathy, ischemic 05/23/2014  . DM (diabetes mellitus), type 2 with renal complications ([place]) 98/92/1194  . CORONARY ATHEROSCLEROSIS NATIVE CORONARY ARTERY 11/09/2010  . Depression, major, single episode, complete remission ([place]) 03/18/2010  . Hypothyroidism 02/03/2009  . Osteoarthritis 05/27/2008  . Hyperlipidemia associated with type 2 diabetes mellitus ([place]) 06/14/2007  . Hypertension associated with diabetes  ([place]) 06/14/2007  . GERD 06/14/2007    Current Outpatient Medications:  .  acetaminophen (TYLENOL) 500 MG tablet, Take 1,000 mg by mouth every 6 (six) hours as needed for mild pain or headache., Disp: , Rfl:  .  amiodarone (PACERONE) 200 MG tablet, Take 0.5 tablets (100 mg total) by mouth daily., Disp: 45 tablet, Rfl: 3 .  atorvastatin (LIPITOR) 80 MG tablet, Take 1 tablet (80 mg total) by mouth daily., Disp: 30 tablet, Rfl: 6 .  carvedilol (COREG) 12.5 MG tablet, TAKE 1 TABLET BY MOUTH TWICE DAILY. (Patient taking differently: Take 12.5 mg by mouth 2 (two) times daily with a meal. ), Disp: 180 tablet, Rfl: 3 .  cilostazol (PLETAL) 100 MG tablet, Take 1 tablet (100 mg total) by mouth 2 (two) times daily., Disp: 180 tablet, Rfl: 3 .  ELIQUIS 2.5 MG TABS tablet, TAKE 1 TABLET BY MOUTH TWICE DAILY. (Patient taking differently: Take 2.5 mg by mouth 2 (two) times daily. ), Disp: 180 tablet, Rfl: 0 .  ezetimibe (ZETIA) 10 MG tablet, TAKE (1) TABLET DAILY. (Patient taking differently: Take 10 mg by mouth daily. ), Disp: 90 tablet, Rfl: 0 .  FLUoxetine (PROZAC) 20 MG capsule, TAKE (1) CAPSULE DAILY. (Patient taking differently: Take 20 mg by mouth daily. ), Disp: 90 capsule, Rfl: 0 .  furosemide (LASIX) 80 MG tablet, TAKE 1&1/2 TABLETS IN THE MORNING AND 1 TABLET IN THE AFTERNOON. (Patient taking differently: Take 80-120 mg by mouth See admin instructions. TAKE 1&1/2 TABLETS (120mg ) IN THE MORNING AND 1 TABLET (80mg )  IN THE AFTERNOON.), Disp: 225 tablet, Rfl: 0 .  gabapentin (NEURONTIN) 100 MG capsule, Take 100 mg by mouth 3 (three) times daily., Disp: ,  Rfl:  .  glucose blood (ACCU-CHEK AVIVA PLUS) test strip, CHECK BLOOD SUGAR 4 TIMES A DAY., Disp: 100 each, Rfl: 11 .  hydrALAZINE (APRESOLINE) 50 MG tablet, Take 1 tablet (50 mg total) by mouth 3 (three) times daily., Disp: 270 tablet, Rfl: 1 .  HYDROcodone-acetaminophen (NORCO/VICODIN) 5-325 MG tablet, Take 1 tablet by mouth daily as needed for moderate  pain. , Disp: , Rfl:  .  isosorbide mononitrate (IMDUR) 30 MG 24 hr tablet, TAKE 1 TABLET EACH DAY. (Patient taking differently: Take 30 mg by mouth daily. ), Disp: 90 tablet, Rfl: 0 .  levothyroxine (SYNTHROID, LEVOTHROID) 125 MCG tablet, Take 1 tablet (125 mcg total) by mouth daily before breakfast., Disp: 90 tablet, Rfl: 1 .  Multiple Vitamin (MULTIVITAMIN) tablet, Take 1 tablet by mouth daily., Disp: , Rfl:  .  potassium chloride SA (K-DUR) 20 MEQ tablet, Take 1 tablet (20 mEq total) by mouth daily., Disp: 90 tablet, Rfl: 1 .  SPIRIVA HANDIHALER 18 MCG inhalation capsule, PLACE 1 CAPSULE INTO INHALER AND INHALE ONCE DAILY. (Patient taking differently: Place 18 mcg into inhaler and inhale daily. ), Disp: 90 capsule, Rfl: 0 .  temazepam (RESTORIL) 30 MG capsule, TAKE 1 CAPSULE AT BEDTIME AS NEEDED FOR SLEEP. (Patient taking differently: Take 30 mg by mouth at bedtime as needed for sleep. ), Disp: 30 capsule, Rfl: 0 .  Influenza Vac A&B Surf Ant Adj (FLUAD) 0.5 ML SUSY, , Disp: , Rfl:  .  Lancets MISC, Check blood sugar 4 times a day (Patient not taking: Reported on 06/13/2019), Disp: 100 each, Rfl: 11 .  nitroGLYCERIN (NITROSTAT) 0.4 MG SL tablet, DISSOLVE 1 TABLET UNDER TONGUE AS NEEDED FOR CHEST PAIN,MAY REPEAT IN5 MINUTES FOR 2 DOSES. (Patient taking differently: Place 0.4 mg under the tongue every 5 (five) minutes as needed for chest pain. ), Disp: 25 tablet, Rfl: 3 .  Potassium Chloride ER 20 MEQ TBCR, Take 20 mEq by mouth daily., Disp: 30 tablet, Rfl: 3 .  traMADol (ULTRAM) 50 MG tablet, Take 1 tablet (50 mg total) by mouth every 6 (six) hours as needed. (Patient not taking: Reported on 07/04/2019), Disp: 12 tablet, Rfl: 0 Allergies  Allergen Reactions  . Metoprolol Shortness Of Breath, Palpitations and Other (See Comments)    Heart starts racing. Shallow breathing; pt states he also get skin irritation on his legs  . Metformin Hives and Swelling    On legs  . Metformin And Related Nausea And  Vomiting      Social History   Socioeconomic History  . Marital status: Married    Spouse name: Not on file  . Number of children: Not on file  . Years of education: Not on file  . Highest education level: Not on file  Occupational History  . 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 Financial resource strain: Not on file  . Food insecurity    Worry: Not on file    Inability: Not on file  . Transportation needs    Medical: Not on file    Non-medical: Not on file  Tobacco Use  . Smoking status: Former Smoker    Packs/day: 1.50    Years: 35.00    Pack years: 52.50    Types: Cigarettes  . Smokeless tobacco: Never Used  . Tobacco comment: quit atleast 25 yrs ago, per pt  Substance and Sexual Activity  . Alcohol use: Yes    Alcohol/week: 3.0 standard drinks    Types: 3 Shots of liquor per  week    Comment: socially  . Drug use: No  . Sexual activity: Yes  Lifestyle  . Physical activity    Days per week: Not on file    Minutes per session: Not on file  . Stress: Not on file  Relationships  . Social Herbalist on phone: Not on file    Gets together: Not on file    Attends religious service: Not on file    Active member of club or organization: Not on file    Attends meetings of clubs or organizations: Not on file    Relationship status: Not on file  . Intimate partner violence    Fear of current or ex partner: Not on file    Emotionally abused: Not on file    Physically abused: Not on file    Forced sexual activity: Not on file  Other Topics Concern  . Not on file  Social History Narrative   Pt lives in [place] with spouse.   Retired from [employer].  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    Physical Exam Cardiovascular:     Rate and Rhythm: Normal rate and regular rhythm.     Pulses: Normal pulses.  Pulmonary:     Effort: Pulmonary effort is normal.     Breath sounds: Normal breath sounds.   Abdominal:     General: There is no distension.  Musculoskeletal:     Right lower leg: No edema.     Left lower leg: No edema.  Skin:    General: Skin is warm and dry.     Capillary Refill: Capillary refill takes less than 2 seconds.  Neurological:     Mental Status: He is alert and oriented to person, place, and time.  Psychiatric:        Mood and Affect: Mood normal.         Future Appointments  Date Time Provider [place]  07/30/2019  1:00 PM [facility] OP 1 [facility]  07/30/2019  2:00 PM Larey Dresser, MD [facility] None  08/01/2019  4:00 PM [name], MD [facility]  08/27/2019  2:45 PM Gardiner Barefoot, DPM [facility]    BP (!) 97/54 (BP Location: Left Arm, Patient Position: Sitting, Cuff Size: Normal)   Pulse 72   Resp 16   Wt 188 lb (85.3 kg)   SpO2 91%   BMI 28.59 kg/m   Weight yesterday- 188 lb Last visit weight- 188 lb  Mr [place] was seen at home today and reported feeling well. He denied chest pain, SOB, headache, dizziness, orthopnea, cough or fever since the last time we saw each other. He stated he has been compliant with his medications over the past week but had missed two days in his pillbox. His medications were verified and his pillbox was refilled. I will follow up next week.   [name], EMT 07/25/19  ACTION: Home visit completed Next visit planned for 1 week

## 2019-07-30 ENCOUNTER — Other Ambulatory Visit: Payer: Self-pay

## 2019-07-30 ENCOUNTER — Other Ambulatory Visit (HOSPITAL_COMMUNITY): Payer: Self-pay

## 2019-07-30 ENCOUNTER — Ambulatory Visit (HOSPITAL_COMMUNITY)
Admission: RE | Admit: 2019-07-30 | Discharge: 2019-07-30 | Disposition: A | Discharge status: Home / Self Care | Payer: Medicare Other | Source: Ambulatory Visit | Attending: Cardiology | Admitting: Cardiology

## 2019-07-30 ENCOUNTER — Ambulatory Visit (HOSPITAL_BASED_OUTPATIENT_CLINIC_OR_DEPARTMENT_OTHER)
Admission: RE | Admit: 2019-07-30 | Discharge: 2019-07-30 | Disposition: A | Discharge status: Home / Self Care | Payer: Medicare Other | Source: Ambulatory Visit | Attending: Cardiology | Admitting: Cardiology

## 2019-07-30 ENCOUNTER — Encounter (HOSPITAL_COMMUNITY): Payer: Self-pay | Admitting: Cardiology

## 2019-07-30 VITALS — BP 131/64 | HR 75 | Wt 192.6 lb

## 2019-07-30 DIAGNOSIS — I252 Old myocardial infarction: Secondary | ICD-10-CM | POA: Diagnosis not present

## 2019-07-30 DIAGNOSIS — Z79899 Other long term (current) drug therapy: Secondary | ICD-10-CM | POA: Diagnosis not present

## 2019-07-30 DIAGNOSIS — G609 Hereditary and idiopathic neuropathy, unspecified: Secondary | ICD-10-CM | POA: Insufficient documentation

## 2019-07-30 DIAGNOSIS — Z8349 Family history of other endocrine, nutritional and metabolic diseases: Secondary | ICD-10-CM | POA: Insufficient documentation

## 2019-07-30 DIAGNOSIS — J439 Emphysema, unspecified: Secondary | ICD-10-CM | POA: Insufficient documentation

## 2019-07-30 DIAGNOSIS — N183 Chronic kidney disease, stage 3 (moderate): Secondary | ICD-10-CM | POA: Insufficient documentation

## 2019-07-30 DIAGNOSIS — I480 Paroxysmal atrial fibrillation: Secondary | ICD-10-CM

## 2019-07-30 DIAGNOSIS — Z87891 Personal history of nicotine dependence: Secondary | ICD-10-CM | POA: Insufficient documentation

## 2019-07-30 DIAGNOSIS — E785 Hyperlipidemia, unspecified: Secondary | ICD-10-CM | POA: Diagnosis not present

## 2019-07-30 DIAGNOSIS — R911 Solitary pulmonary nodule: Secondary | ICD-10-CM | POA: Insufficient documentation

## 2019-07-30 DIAGNOSIS — I6523 Occlusion and stenosis of bilateral carotid arteries: Secondary | ICD-10-CM | POA: Diagnosis not present

## 2019-07-30 DIAGNOSIS — I5042 Chronic combined systolic (congestive) and diastolic (congestive) heart failure: Secondary | ICD-10-CM | POA: Diagnosis not present

## 2019-07-30 DIAGNOSIS — Z85820 Personal history of malignant melanoma of skin: Secondary | ICD-10-CM | POA: Diagnosis not present

## 2019-07-30 DIAGNOSIS — R2689 Other abnormalities of gait and mobility: Secondary | ICD-10-CM | POA: Insufficient documentation

## 2019-07-30 DIAGNOSIS — E039 Hypothyroidism, unspecified: Secondary | ICD-10-CM | POA: Insufficient documentation

## 2019-07-30 DIAGNOSIS — Z8673 Personal history of transient ischemic attack (TIA), and cerebral infarction without residual deficits: Secondary | ICD-10-CM | POA: Diagnosis not present

## 2019-07-30 DIAGNOSIS — Z8249 Family history of ischemic heart disease and other diseases of the circulatory system: Secondary | ICD-10-CM | POA: Insufficient documentation

## 2019-07-30 DIAGNOSIS — Z79890 Hormone replacement therapy (postmenopausal): Secondary | ICD-10-CM | POA: Insufficient documentation

## 2019-07-30 DIAGNOSIS — Z833 Family history of diabetes mellitus: Secondary | ICD-10-CM | POA: Insufficient documentation

## 2019-07-30 DIAGNOSIS — I255 Ischemic cardiomyopathy: Secondary | ICD-10-CM | POA: Insufficient documentation

## 2019-07-30 DIAGNOSIS — I4892 Unspecified atrial flutter: Secondary | ICD-10-CM | POA: Insufficient documentation

## 2019-07-30 DIAGNOSIS — Z7901 Long term (current) use of anticoagulants: Secondary | ICD-10-CM | POA: Insufficient documentation

## 2019-07-30 DIAGNOSIS — I130 Hypertensive heart and chronic kidney disease with heart failure and stage 1 through stage 4 chronic kidney disease, or unspecified chronic kidney disease: Secondary | ICD-10-CM | POA: Insufficient documentation

## 2019-07-30 DIAGNOSIS — E1122 Type 2 diabetes mellitus with diabetic chronic kidney disease: Secondary | ICD-10-CM | POA: Insufficient documentation

## 2019-07-30 DIAGNOSIS — Z951 Presence of aortocoronary bypass graft: Secondary | ICD-10-CM | POA: Insufficient documentation

## 2019-07-30 DIAGNOSIS — I5032 Chronic diastolic (congestive) heart failure: Secondary | ICD-10-CM

## 2019-07-30 DIAGNOSIS — I25810 Atherosclerosis of coronary artery bypass graft(s) without angina pectoris: Secondary | ICD-10-CM | POA: Insufficient documentation

## 2019-07-30 DIAGNOSIS — Z800 Family history of malignant neoplasm of digestive organs: Secondary | ICD-10-CM | POA: Insufficient documentation

## 2019-07-30 DIAGNOSIS — R9431 Abnormal electrocardiogram [ECG] [EKG]: Secondary | ICD-10-CM | POA: Insufficient documentation

## 2019-07-30 DIAGNOSIS — I454 Nonspecific intraventricular block: Secondary | ICD-10-CM | POA: Insufficient documentation

## 2019-07-30 LAB — COMPREHENSIVE METABOLIC PANEL
ALT: 38 U/L (ref 0–44)
AST: 36 U/L (ref 15–41)
Albumin: 3.4 g/dL — ABNORMAL LOW (ref 3.5–5.0)
Alkaline Phosphatase: 86 U/L (ref 38–126)
Anion gap: 10 (ref 5–15)
BUN: 26 mg/dL — ABNORMAL HIGH (ref 8–23)
CO2: 28 mmol/L (ref 22–32)
Calcium: 9.1 mg/dL (ref 8.9–10.3)
Chloride: 105 mmol/L (ref 98–111)
Creatinine, Ser: 1.63 mg/dL — ABNORMAL HIGH (ref 0.61–1.24)
GFR calc Af Amer: 43 mL/min — ABNORMAL LOW (ref >60)
GFR calc non Af Amer: 37 mL/min — ABNORMAL LOW (ref >60)
Glucose, Bld: 117 mg/dL — ABNORMAL HIGH (ref 70–99)
Potassium: 4 mmol/L (ref 3.5–5.1)
Sodium: 143 mmol/L (ref 135–145)
Total Bilirubin: 0.8 mg/dL (ref 0.3–1.2)
Total Protein: 6.1 g/dL — ABNORMAL LOW (ref 6.5–8.1)

## 2019-07-30 LAB — CBC
HCT: 37 % — ABNORMAL LOW (ref 39.0–52.0)
Hemoglobin: 12.1 g/dL — ABNORMAL LOW (ref 13.0–17.0)
MCH: 33.2 pg (ref 26.0–34.0)
MCHC: 32.7 g/dL (ref 30.0–36.0)
MCV: 101.4 fL — ABNORMAL HIGH (ref 80.0–100.0)
Platelets: 156 10*3/uL (ref 150–400)
RBC: 3.65 MIL/uL — ABNORMAL LOW (ref 4.22–5.81)
RDW: 12.9 % (ref 11.5–15.5)
WBC: 5.6 10*3/uL (ref 4.0–10.5)
nRBC: 0 % (ref 0.0–0.2)

## 2019-07-30 MED ORDER — EZETIMIBE 10 MG PO TABS: ORAL_TABLET | ORAL | 0 refills | Status: DC

## 2019-07-30 MED ORDER — APIXABAN 2.5 MG PO TABS: 2.5000 mg | ORAL_TABLET | Freq: Two times a day (BID) | ORAL | 0 refills | Status: DC

## 2019-07-30 MED ORDER — ISOSORBIDE MONONITRATE ER 30 MG PO TB24: ORAL_TABLET | ORAL | 0 refills | Status: DC

## 2019-07-30 MED ORDER — FUROSEMIDE 80 MG PO TABS: ORAL_TABLET | ORAL | 0 refills | Status: DC

## 2019-07-30 MED ORDER — CARVEDILOL 12.5 MG PO TABS: 12.5000 mg | ORAL_TABLET | Freq: Two times a day (BID) | ORAL | 3 refills | Status: DC

## 2019-07-30 MED ORDER — NITROGLYCERIN 0.4 MG SL SUBL: SUBLINGUAL_TABLET | SUBLINGUAL | 3 refills | Status: DC

## 2019-07-30 NOTE — Patient Instructions (Signed)
Increase Furosemide to 120 mg (1 & 1/2 tabs) Twice daily FOR 2 DAYS ONLY, then back to your regular dose of 120 mg (1 & 1/2 tabs) in AM and 80 mg (1 tab) in PM  Labs done today  Your physician recommends that you schedule a follow-up appointment in: 3 months  At the [facility], you and your health needs are our priority. As part of our continuing mission to provide you with exceptional heart care, we have created designated Provider Care Teams. These Care Teams include your primary Cardiologist (physician) and Advanced Practice Providers (APPs- Physician Assistants and Nurse Practitioners) who all work together to provide you with the care you need, when you need it.   You may see any of the following providers on your designated Care Team at your next follow up: Marland Kitchen Dr Glori Bickers . Dr Loralie Champagne . Darrick Grinder, NP   Please be sure to bring in all your medications bottles to every appointment.

## 2019-07-30 NOTE — Progress Notes (Signed)
Spoke w/Zach, paramedic, he is aware of medication changes and will go by pt's home today to fix his pill box.

## 2019-07-30 NOTE — Progress Notes (Signed)
Date:  07/30/2019   ID:  Andrew [name], DOB Aug 28, 1928, MRN [id number]  Provider location: [facility] Type of Visit: Established patient   PCP:  [name], MD  Cardiologist:  Dr. [name]  Chief Complaint: Shortness of breath   History of Present Illness: Andrew [name] is a 85 y.o. male who has a history of CKD, CAD s/p CABG, and ischemic cardiomyopathy with primarily diastolic CHF.  He has PAD followed at [facility].  I took him for [facility] in 11/15. 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  He had had occlusion of a relatively small LAD, there were right to left collaterals and no intervention was done (medical management).    He and his wife went on a cruise along the [employer] in 5/16.  He admits to considerable dietary indiscretion (high sodium diet). It appears that he developed a hypertensive crisis along with chest pain while on the ship. He went into atrial fibrillation and developed CHF.  He was taken to the hospital in [place], [place].  He was in the ICU for about a week on Bipap intermittently.  Troponin peaked at 0.09 during this admission.  He was diuresed and after about 2 wks left the hospital and was able to fly home.    Cardiolite (6/16) showed EF 42%, prior anterolateral MI, no ischemia. Echo in 10/16 with EF 40-45%.    He was admitted on 07/06/15 for RLQ pain and fever.  He was treated for UTI and had a kidney stone as well.  He developed SOB after IVF were given for AKI.  He was treated for acute COPD exacerbation as well as CHF decompensation.  He was diuresed with IV lasix.  His weight on admission was 179 and got as high as 188 after IVF. Discharge weight was 181.  He was admitted in 10/16 with a cerebellar CVA.  He has some resultant imbalance.   He was again admitted in 11/16 with acute on chronic systolic CHF.  This was a short admission that appeared to be precipitated by a sodium load from eating at a [employer] .    3/18 Echo showed stable EF 40-45%.  He was also having chest pain so I did a Cardiolite in 3/18, no ischemia.   He was admitted in 11/18 for elective hernia repair. Post-op, he developed hypoxemia and had a rehab stay.  He is now off oxygen.   Creatinine increased to 2.97 in 1/19.  I cut back on losartan and Lasix.  He developed increased lower extremity edema and we increased Lasix back to 120 qam/80 qpm.  He had a mechanical fall in 7/20 (tripped). He hit his head but refused to go to the ER.  Later in 7/20, he was admitted with fever and started on antibiotics with concern for PNA, but CT chest did not show definite PNA.  ?Viral syndrome.  COVID-19 was negative.   Echo was done today and reviewed.  EF 45-50% with anterolateral hypokinesis, normal RV.   He returns for followup of CHF and CAD.  He feels like he is doing well currently.  No further falls and no lightheadedness.  No chest pain.  No palpitations. He exercises in a pool and has no problems climbing a flight of stairs.  He denies dyspnea walking on flat ground.  No orthopnea/PND.  Weight is up 1 lb.   ECG (personally reviewed): NSR, 1st degree AVB (308 msec), IVCD (  148 msec).   Labs (12/14): K 4.8, creatinine 1.5 Labs (5/15): LDL 92 Labs (11/15): K 4.1, creatinine 1.5 Labs (12/15): K 4, creatinine 1.5 Labs (1/16): LFTs normal, LDL 51, HDL 47 Labs (5/16): TnI 0.09 Labs (6/16): K 4.8 => 4.4, creatinine 1.79 => 2.26, HCT 28.7 Labs (7/16) K 4.2, creatinine 1.98, LFTs normal, TSH normal Labs (8/16): HCT 27.1 Labs (9/16): hgb 8.1, K 4.3, creatinine 1.8, LFTs normal, TSH elevated Labs (10/16): LDL 74, HDL 45, TSH 5.19 (increased), free T3 and free T4 normal.  Labs (11/16): K 3.5, creatinine 1.47, AST 84, ALT 89 Labs (12/16): K 4.3, creatinine 1.58, AST 43, ALT 42 Labs (3/17): K 4.2, creatinine 1.4, hgb 10.7, AST 36, ALT 45 Labs (6/17): K 4, creatinine 1.69, hgb 10.9, proBNP 389, TSH mildly elevated Labs (8/17): Free T4 and T3  normal, TSH mildly elevated, K 4.2, creatinine 1.7, LFTs normal, hgb 10.4 Labs (12/17): TSH elevated but free T3 and T4 normal, K 4.5, creatinine 1.9, LDL 45, HDL 39, LFTs normal, HCT 32.9 Labs (1/18): K 4.4, creatinine 1.93 Labs (3/18): K 4.3, creatinine 1.87, BNP 187, hgb 11.1, LDL 75, LFTs normal Labs (4/18): K 4.1, creatinine 1.7 Labs (9/18): K 4, creatinine 1.8, hgb 11.9 Labs (10/18): free T4 0.4, free T3 1.9, TSH 139  Labs (12/18): K 3.4, creatinine 1.76, hgb 12.2 Labs (1/19): K 4 => 4.9, creatinine 2.7 => 2.97, TSH 179, LFTs normal Labs (2/19): K 4.6, creatinine 2.19 Labs (4/19): LDL 61, HDL 36, K 3.9, creatinine 1.77, LFTs normal  Labs (6/19): hgb 11.7, K 3.8, creatinine 2.09, TSH 10 Labs (9/19): K 3.9, creatinine 1.69 Labs (12/19): K 3.5, creatinine 1.69, LDL 75, HDL 39, TSH normal Labs (7/20): K 3.8, creatinine 1.69  PMH: 1. CKD stage 3 2. HTN 3. Hyperlipidemia 4. AAA: s/p surgery with left renal artery bypass in 1997 - Abdominal US 10/18 with 3.2 AAA 5. GERD 6. IBS 7. Melanoma s/p reception 8. Diabetes: Diet-controlled 9. Carotid stenosis: Carotid dopplers (4/15) with 40-59% bilateral stenosis. Carotid dopplers (7/16) with < 40% BICA stenosis.  Carotid dopplers (10/16) with 40-59% BICA stenosis.  - Carotids (8/17) with minimal disease.  - Carotids (10/18) with 1-39% BICA stenosis.  - Carotids (1/19): 1-39% BICA stenosis 10. PAD: 9/14 ABIs 0.91 right 1.09 left.  7/16 ABIs 0.86 right, 1.1 left; aortoiliac duplex with < 50% bilateral iliac stenosis.  - Aortoiliac duplex (8/17) with < 50% bilateral iliac stenosis.   - ABIs (10/17): left 0.91, right 0.98, left TBI 0.44 (abnormal).  - ABIs (7/18): right 0.97, right 0.75 - ABIs (1/19): right 1.04, left 1.03 - ABIs (7/19): right 1.13, noncompressible left ABI but TBI 0.93 11. CAD: Has super-dominant RCA. s/p CABG 1992.  He had Taxus DES to mid PDA in 5/02. LHC (1/05) with 90% ostial PDA (had DES to this vessel), totally  occluded RIMA-PDA, totally occluded PLV, sequential SVG-PLV with 1 branch occluded.  LHC (11/15) with left main off the right cusp, 40-50% ostial left main, total occlusion of the proximal LAD (small vessel) with right to left collaterals, large super-dominant RCA with 50% ISR mid RCA, 50% ostial PDA, patent SVG-PLV, RIMA-PDA known atretic. Lexiscan Cardiolite (6/16) with EF 42%, prior anterolateral MI, no ischemia.  - Lexiscan Cardiolite (3/18): EF 51%, basal to mid anterolateral fixed defect with no ischemia.  12. Ischemic cardiomyopathy: Echo (12/09) with EF 45-50%, basal to mid inferolateral hypokinesis. Echo (6/15) with EF 45-50%, akinesis of the mid to apical inferolateral wall.  Echo (6/16)  with EF 50-55%, mild LVH, inferior hypokinesis, mild MR.  Echo (10/16) with EF 40-45%, moderate LVH, mildly decreased RV systolic function.  - Echo (3/18) with EF 40-45%, mid anterior hypokinesis.  - Echo (2/19) with EF 60-65%, mild LVH, trade II diastolic dysfunction.  - Echo (8/20): EF 45-50%, anterolateral hypokinesis, normal RV size and systolic function.  13. OA 14. Atrial fibrillation/flutter: Paroxysmal.  Noted during 5/16 hospitalization in [place] and in 6/16.  He developed atrial flutter in 8/16, back in NSR by 9/16.  15. Emphysema: PFTs (8/16) with FVC 88%, FEV1 74%, ratio 81%, TLC 83%, DLCO 31%. - PFTs (12/18): FVC 64%, FEV1 69%, ratio 105%, TLC 82%, DLCO 33% => mild obstruction, severely decreased DLCO (similar to prior).   16. Renal cysts 17. Idiopathic peripheral neuropathy.  18. Anemia.  51. 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  20. Hernia repair (11/18) 21. Hypothyroidism 22. Bronchiectasis: CT chest 1/19 with bronchiectasis.  23. Lung nodule: 1.3 cm RML nodule on 1/19 CT, was noted in 2014 => slow growing. Possibly malignant.  Decision made with pulmonary to watch and avoid biopsy.  24. Suspect chronic aspiration with bronchiectasis.    Current Outpatient Medications  Medication Sig Dispense  Refill  . acetaminophen (TYLENOL) 500 MG tablet Take 1,000 mg by mouth every 6 (six) hours as needed for mild pain or headache.    Marland Kitchen amiodarone (PACERONE) 200 MG tablet Take 0.5 tablets (100 mg total) by mouth daily. 45 tablet 3  . apixaban (ELIQUIS) 2.5 MG TABS tablet Take 1 tablet (2.5 mg total) by mouth 2 (two) times daily. 180 tablet 0  . atorvastatin (LIPITOR) 80 MG tablet Take 1 tablet (80 mg total) by mouth daily. 30 tablet 6  . carvedilol (COREG) 12.5 MG tablet Take 1 tablet (12.5 mg total) by mouth 2 (two) times daily. 180 tablet 3  . cilostazol (PLETAL) 100 MG tablet Take 1 tablet (100 mg total) by mouth 2 (two) times daily. 180 tablet 3  . ezetimibe (ZETIA) 10 MG tablet TAKE (1) TABLET DAILY. 90 tablet 0  . FLUoxetine (PROZAC) 20 MG capsule TAKE (1) CAPSULE DAILY. (Patient taking differently: Take 20 mg by mouth daily. ) 90 capsule 0  . furosemide (LASIX) 80 MG tablet TAKE 1&1/2 TABLETS IN THE MORNING AND 1 TABLET IN THE AFTERNOON. 225 tablet 0  . gabapentin (NEURONTIN) 100 MG capsule Take 100 mg by mouth 3 (three) times daily.    Marland Kitchen glucose blood (ACCU-CHEK AVIVA PLUS) test strip CHECK BLOOD SUGAR 4 TIMES A DAY. 100 each 11  . hydrALAZINE (APRESOLINE) 50 MG tablet Take 1 tablet (50 mg total) by mouth 3 (three) times daily. 270 tablet 1  . HYDROcodone-acetaminophen (NORCO/VICODIN) 5-325 MG tablet Take 1 tablet by mouth daily as needed for moderate pain.     . Influenza Vac A&B Surf Ant Adj (FLUAD) 0.5 ML SUSY     . isosorbide mononitrate (IMDUR) 30 MG 24 hr tablet TAKE 1 TABLET EACH DAY. 90 tablet 0  . Lancets MISC Check blood sugar 4 times a day 100 each 11  . levothyroxine (SYNTHROID, LEVOTHROID) 125 MCG tablet Take 1 tablet (125 mcg total) by mouth daily before breakfast. 90 tablet 1  . Multiple Vitamin (MULTIVITAMIN) tablet Take 1 tablet by mouth daily.    . nitroGLYCERIN (NITROSTAT) 0.4 MG SL tablet DISSOLVE 1 TABLET UNDER TONGUE AS NEEDED FOR CHEST PAIN,MAY REPEAT IN5 MINUTES FOR 2  DOSES. 25 tablet 3  . potassium chloride SA (K-DUR) 20 MEQ tablet Take 1 tablet (  20 mEq total) by mouth daily. 90 tablet 1  . SPIRIVA HANDIHALER 18 MCG inhalation capsule PLACE 1 CAPSULE INTO INHALER AND INHALE ONCE DAILY. (Patient taking differently: Place 18 mcg into inhaler and inhale daily. ) 90 capsule 0  . temazepam (RESTORIL) 30 MG capsule TAKE 1 CAPSULE AT BEDTIME AS NEEDED FOR SLEEP. (Patient taking differently: Take 30 mg by mouth at bedtime as needed for sleep. ) 30 capsule 0  . traMADol (ULTRAM) 50 MG tablet Take 1 tablet (50 mg total) by mouth every 6 (six) hours as needed. 12 tablet 0   No current facility-administered medications for this encounter.     Allergies:   Metoprolol, Metformin, and Metformin and related   Social History:  The patient  reports that he has quit smoking. His smoking use included cigarettes. He has a 52.50 pack-year smoking history. He has never used smokeless tobacco. He reports current alcohol use of about 3.0 standard drinks of alcohol per week. He reports that he does not use drugs.   Family History:  The patient's family history includes Colon cancer in his father; Coronary artery disease in his brother; Diabetes in his brother and son; Heart disease in his brother; Hyperlipidemia in his brother and son; Peripheral vascular disease in his brother; Throat cancer in his brother.   ROS:  Please see the history of present illness.   All other systems are personally reviewed and negative.   Exam:   BP 131/64   Pulse 75   Wt 87.4 kg (192 lb 9.6 oz)   SpO2 91%   BMI 29.28 kg/m  General: NAD Neck: JVP 8 cm + HJR, no thyromegaly or thyroid nodule.  Lungs: Clear to auscultation bilaterally with normal respiratory effort. CV: Nondisplaced PMI.  Heart regular S1/S2, no S3/S4, no murmur.  Trace ankle edema.  No carotid bruit.  Normal pedal pulses.  Abdomen: Soft, nontender, no hepatosplenomegaly, no distention.  Skin: Intact without lesions or rashes.   Neurologic: Alert and oriented x 3.  Psych: Normal affect. Extremities: No clubbing or cyanosis.  HEENT: Normal.    Recent Labs: 07/02/2019: B Natriuretic Peptide 393.5; TSH 1.528 07/30/2019: ALT 38; BUN 26; Creatinine, Ser 1.63; Hemoglobin 12.1; Platelets 156; Potassium 4.0; Sodium 143  Personally reviewed   Wt Readings from Last 3 Encounters:  07/30/19 87.4 kg (192 lb 9.6 oz)  07/25/19 85.3 kg (188 lb)  07/18/19 85.3 kg (188 lb)      ASSESSMENT AND PLAN:  1. CAD: s/p CABG.  Cath in 11/15 showed occluded LAD with left to right collaterals.  The LAD was a relatively small vessel (super-dominant right).  Managed medically. Lexiscan Cardiolite in 6/16 with infarction but no ischemia.  Cardiolite 3/18 with infarction, no ischemia. No chest pain.  - Continue ASA and statin.  2. Chronic primarily diastolic CHF/ischemic cardiomyopathy: EF 40-45% on 3/18 echo.  Echo 8/20 with EF 45-50%, normal RV.  NYHA II. He is mildly volume overloaded on exam.   - Increase Lasix to 120 mg bid x 2 days then back to 120 qam/80 qpm.  BMET today.   - Continue hydralazine 50 mg tid.    - Continue Imdur 30 mg daily.    - Continue Coreg 12.5 mg bid.  - Not on ACEi/ARB with elevated creatinine.    3. Carotid stenosis: Stable. Followed at [facility].  4. PAD: Improved claudication, ABIs had normalized when last checked.  No pedal ulcers.   - Follows at [facility]. Plan for medical management.  -  Continue cilostazol, unable to come off due to worsening claudication when he stops it.  5. Hyperlipidemia: Continue atorvastatin, good lipids in 12/19.   6. CKD III:  BMET today.  7. Atrial fibrillation/flutter:  Paroxysmal.  NSR today.  - Continue amiodarone 100 mg daily. Will need to get regular eye exams with amiodarone use.  High resolution CT in 1/19 did not appear to show evidence for amiodarone lung toxicity. Check LFTs today, TSH followed by PCP (hypothyroidism).  - Continue Eliquis 2.5 bid.  Check CBC.  8. Suspected OSA:   Says that he would not use CPAP.  9. COPD: He is now off oxygen. He has bronchiectasis thought to be due to chronic aspiration. - Follows with Dr. Lake Bells.  10. CVA: Cerebellar, 10/16.  He has had some resulting imbalance.  - He is no longer driving.  11. Hypertension: BP has been controlled when he takes his medications.  Discussed compliance again, followed by paramedicine.     12. Hypothyroidism: Likely related to amiodarone. - Followed by PCP.  13. Pulmonary nodule: 1.3 cm RML nodule on 1/19 CT chest.  It was also seen in 2014 => slow growing.  Dr Lake Bells has discussed with wife and they have decided not to go forward with biopsy. They would not be interested in lung cancer treatment.  CT chest in 7/20 showed stable 1 cm RML nodule.    Signed, Loralie Champagne, MD  07/30/2019  [facility] [address] [facility] [place] [address] [phone] (office) [phone] (fax)

## 2019-07-30 NOTE — Progress Notes (Signed)
Mr Bise was seen at home following his appointment at the HF clinic and reported feeling well. The purpose of this visit was to adjust his lasix dose to 120 mg BID for the next two days in accordance with Dr Claris Gladden orders. Nothing further was needed at this time. I will follow up at our regularly scheduled visit on Thursday.

## 2019-08-01 ENCOUNTER — Other Ambulatory Visit (HOSPITAL_COMMUNITY): Payer: Self-pay

## 2019-08-01 ENCOUNTER — Other Ambulatory Visit (HOSPITAL_COMMUNITY): Payer: Self-pay | Admitting: Cardiology

## 2019-08-01 ENCOUNTER — Ambulatory Visit (INDEPENDENT_AMBULATORY_CARE_PROVIDER_SITE_OTHER): Payer: Medicare Other | Admitting: Family Medicine

## 2019-08-01 DIAGNOSIS — I10 Essential (primary) hypertension: Secondary | ICD-10-CM

## 2019-08-01 DIAGNOSIS — E1122 Type 2 diabetes mellitus with diabetic chronic kidney disease: Secondary | ICD-10-CM | POA: Diagnosis not present

## 2019-08-01 DIAGNOSIS — I255 Ischemic cardiomyopathy: Secondary | ICD-10-CM

## 2019-08-01 DIAGNOSIS — I714 Abdominal aortic aneurysm, without rupture, unspecified: Secondary | ICD-10-CM

## 2019-08-01 DIAGNOSIS — E785 Hyperlipidemia, unspecified: Secondary | ICD-10-CM

## 2019-08-01 DIAGNOSIS — F325 Major depressive disorder, single episode, in full remission: Secondary | ICD-10-CM

## 2019-08-01 DIAGNOSIS — I152 Hypertension secondary to endocrine disorders: Secondary | ICD-10-CM

## 2019-08-01 DIAGNOSIS — I739 Peripheral vascular disease, unspecified: Secondary | ICD-10-CM

## 2019-08-01 DIAGNOSIS — I6521 Occlusion and stenosis of right carotid artery: Secondary | ICD-10-CM

## 2019-08-01 DIAGNOSIS — E1159 Type 2 diabetes mellitus with other circulatory complications: Secondary | ICD-10-CM

## 2019-08-01 DIAGNOSIS — I5022 Chronic systolic (congestive) heart failure: Secondary | ICD-10-CM

## 2019-08-01 DIAGNOSIS — E039 Hypothyroidism, unspecified: Secondary | ICD-10-CM

## 2019-08-01 DIAGNOSIS — I480 Paroxysmal atrial fibrillation: Secondary | ICD-10-CM | POA: Diagnosis not present

## 2019-08-01 DIAGNOSIS — E1169 Type 2 diabetes mellitus with other specified complication: Secondary | ICD-10-CM

## 2019-08-01 MED ORDER — FUROSEMIDE 80 MG PO TABS: ORAL_TABLET | ORAL | 6 refills | Status: DC

## 2019-08-01 NOTE — Assessment & Plan Note (Signed)
Continue aspirin and statin. 

## 2019-08-01 NOTE — Assessment & Plan Note (Signed)
Continue atorvastatin 80 mg daily and Zetia 10 mg daily 

## 2019-08-01 NOTE — Assessment & Plan Note (Signed)
Stable on Prozac 20 mg daily

## 2019-08-01 NOTE — Assessment & Plan Note (Signed)
Stable.  Continue management per cardiology. 

## 2019-08-01 NOTE — Assessment & Plan Note (Addendum)
Continue management per cardiology.  No signs of volume overload.  Followed by paramedicine.

## 2019-08-01 NOTE — Assessment & Plan Note (Signed)
Stable.  Continue management per vascular surgery.

## 2019-08-01 NOTE — Progress Notes (Signed)
Chief Complaint:  Andrew [name] is a 85 y.o. male who presents today for a virtual office visit with a chief complaint of hospital follow up for cough.   Assessment/Plan:  Cough / CAP Doing much better over the last month.  No signs of recurrent infection.  Carotid artery stenosis Stable.  Continue management per vascular surgery.  Chronic systolic CHF (congestive heart failure) ([place]) Continue management per cardiology.  No signs of volume overload.  Followed by paramedicine.  PVD (peripheral vascular disease) (HCC) Continue aspirin and statin.  Paroxysmal atrial fibrillation (HCC) Anticoagulated on Eliquis.  Continue management per cardiology.  Hypertension associated with diabetes ([place]) Continue hydralazine 50 mg 3 times daily, Imdur 30 mg daily, and Coreg 12.5 mg twice daily.  Depression, major, single episode, complete remission (HCC) Stable on Prozac 20 mg daily.  Hyperlipidemia associated with type 2 diabetes mellitus (HCC) Continue atorvastatin 80 mg daily and Zetia 10 mg daily.  Hypothyroidism Continue levothyroxine 125 mcg daily.  Check TSH with next blood draw.  AAA (abdominal aortic aneurysm) without rupture Stable.  Continue management per vascular surgery.  Cardiomyopathy, ischemic Stable.  Continue management per cardiology.  DM (diabetes mellitus), type 2 with renal complications (HCC) Stable.  Last A1c 7.3.  Check A1c with next blood draw.     Subjective:  HPI:  PAtient presented to the ED with fever, shortness of breath, chill, cough, wheeze and back pain. COVID testing was negative.  Patient was started on ceftriaxone and azithromycin for presumed CAP was admitted to the hospital.  On hospital day 1, patient was adamant about going home and left despite having recommendation to go to CIR. he declines and was discharged home with home health OT/PT.  He has done well since being discharged home.  Over the past month he feels well.  He had a  visit with his cardiologist 2 days ago and is currently being followed by a paramedic that comes to his house.   His stable, chronic medical conditions are outlined below:  #Coronary artery disease status post CABG / ischemic cardiomyopathy / CHF / PAF -Follows with cardiology - On aspirin 81 mg daily and atorvastatin 80 mg daily - On Lasix 120 mg twice daily, hydralazine 50 mg 3 times daily, Imdur 30 mg daily, and Coreg 12.5 mg twice daily  #Carotid artery stenosis / peripheral artery disease / AAA - Follows with vascular surgery  - On atorvastatin 80mg  daily - On Pletal 100mg  twice daily - On eliquis 2.5mg  twice daily  # Dyslipidemia - On atorvastatin 80 mg daily and Zetia 10 mg daily  # CKD Stage 3  # Hypothyroidism -On levothyroxine 125 mcg daily  # COPD - Follows with pulmonology for this -On Spiriva  # Depression / Anxiety / Insomnia -On Prozac 20 mg daily and Restoril 30 mg nightly as needed ROS: No reported SI or HI  # T2DM - Diet controlled without medications    ROS: Per HPI  PMH: He reports that he has quit smoking. His smoking use included cigarettes. He has a 52.50 pack-year smoking history. He has never used smokeless tobacco. He reports current alcohol use of about 3.0 standard drinks of alcohol per week. He reports that he does not use drugs.      Objective/Observations  Physical Exam: Gen: NAD, resting comfortably Pulm: Normal work of breathing Neuro: Grossly normal, moves all extremities Psych: Normal affect and thought content  Virtual Visit via Video   I connected with Andrew Pane  [name] on 08/01/19 at  4:00 PM EDT by a video enabled telemedicine application and verified that I am speaking with the correct person using two identifiers. I discussed the limitations of evaluation and management by telemedicine and the availability of in person appointments. The patient expressed understanding and agreed to proceed.   Patient location: Home Provider  location: [place] participating in the virtual visit: Myself and Patient     Algis Greenhouse. Jerline Pain, MD 08/01/2019 4:13 PM

## 2019-08-01 NOTE — Assessment & Plan Note (Signed)
Stable.  Last A1c 7.3.  Check A1c with next blood draw.

## 2019-08-01 NOTE — Assessment & Plan Note (Signed)
Continue hydralazine 50 mg 3 times daily, Imdur 30 mg daily, and Coreg 12.5 mg twice daily.

## 2019-08-01 NOTE — Progress Notes (Signed)
Paramedicine Encounter    Patient ID: Andrew [name], male    DOB: [birth year], 85 y.o.   MRN: [id number]   Patient Care Team: [name], MD as PCP - General (Family Medicine) Larey Dresser, MD as PCP - Advanced Heart Failure (Cardiology) Larey Dresser, MD as Consulting Physician (Cardiology) Jorge Ny, LCSW as Social Worker (Licensed Clinical Social Worker)  Patient Active Problem List   Diagnosis Date Noted  . Acute respiratory failure ([place]) 07/02/2019  . Fall at home, initial encounter   . Chronic respiratory failure with hypoxia ([place]) 09/16/2018  . Bilateral recurrent inguinal hernias 11/28/2017  . Calf pain 05/25/2016  . Carotid artery stenosis 01/12/2016  . Chronic systolic CHF (congestive heart failure) ([place]) 12/02/2015  . Cerebellar infarct ([place]) 10/16/2015  . 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 ([place]) 10/16/2015  . PVD (peripheral vascular disease) ([place])   . Hereditary and idiopathic peripheral neuropathy 10/02/2015  . Peripelvic (lymphatic) cyst   . Pernicious anemia 07/15/2015  . Bilateral renal cysts 07/12/2015  . Lung nodule, 51mm RML CT 07/12/15 07/12/2015  . Constipation 07/12/2015  . Kidney stone on right side 07/12/2015  . Stage 3 chronic kidney disease ([place]) 06/14/2015  . Chronic diastolic CHF (congestive heart failure) ([place]) 05/28/2015  . Paroxysmal atrial fibrillation ([place]) 05/28/2015  . AAA (abdominal aortic aneurysm) without rupture ([place]) 09/30/2014  . Left-sided low back pain without sciatica 07/21/2014  . Cardiomyopathy, ischemic 05/23/2014  . DM (diabetes mellitus), type 2 with renal complications ([place]) 85/88/5027  . CORONARY ATHEROSCLEROSIS NATIVE CORONARY ARTERY 11/09/2010  . Depression, major, single episode, complete remission ([place]) 03/18/2010  . Hypothyroidism 02/03/2009  . Osteoarthritis 05/27/2008  . Hyperlipidemia associated with type 2 diabetes mellitus ([place]) 06/14/2007  . Hypertension associated with diabetes  ([place]) 06/14/2007  . GERD 06/14/2007    Current Outpatient Medications:  .  acetaminophen (TYLENOL) 500 MG tablet, Take 1,000 mg by mouth every 6 (six) hours as needed for mild pain or headache., Disp: , Rfl:  .  amiodarone (PACERONE) 200 MG tablet, Take 0.5 tablets (100 mg total) by mouth daily., Disp: 45 tablet, Rfl: 3 .  apixaban (ELIQUIS) 2.5 MG TABS tablet, Take 1 tablet (2.5 mg total) by mouth 2 (two) times daily., Disp: 180 tablet, Rfl: 0 .  atorvastatin (LIPITOR) 80 MG tablet, Take 1 tablet (80 mg total) by mouth daily., Disp: 30 tablet, Rfl: 6 .  carvedilol (COREG) 12.5 MG tablet, Take 1 tablet (12.5 mg total) by mouth 2 (two) times daily., Disp: 180 tablet, Rfl: 3 .  cilostazol (PLETAL) 100 MG tablet, Take 1 tablet (100 mg total) by mouth 2 (two) times daily., Disp: 180 tablet, Rfl: 3 .  ezetimibe (ZETIA) 10 MG tablet, TAKE (1) TABLET DAILY., Disp: 90 tablet, Rfl: 0 .  FLUoxetine (PROZAC) 20 MG capsule, TAKE (1) CAPSULE DAILY. (Patient taking differently: Take 20 mg by mouth daily. ), Disp: 90 capsule, Rfl: 0 .  furosemide (LASIX) 80 MG tablet, TAKE 1&1/2 TABLETS IN THE MORNING AND 1 TABLET IN THE AFTERNOON., Disp: 225 tablet, Rfl: 0 .  gabapentin (NEURONTIN) 100 MG capsule, Take 100 mg by mouth 3 (three) times daily., Disp: , Rfl:  .  glucose blood (ACCU-CHEK AVIVA PLUS) test strip, CHECK BLOOD SUGAR 4 TIMES A DAY., Disp: 100 each, Rfl: 11 .  hydrALAZINE (APRESOLINE) 50 MG tablet, Take 1 tablet (50 mg total) by mouth 3 (three) times daily., Disp: 270 tablet, Rfl: 1 .  HYDROcodone-acetaminophen (NORCO/VICODIN) 5-325 MG tablet,  Take 1 tablet by mouth daily as needed for moderate pain. , Disp: , Rfl:  .  Influenza Vac A&B Surf Ant Adj (FLUAD) 0.5 ML SUSY, , Disp: , Rfl:  .  isosorbide mononitrate (IMDUR) 30 MG 24 hr tablet, TAKE 1 TABLET EACH DAY., Disp: 90 tablet, Rfl: 0 .  Lancets MISC, Check blood sugar 4 times a day, Disp: 100 each, Rfl: 11 .  levothyroxine (SYNTHROID, LEVOTHROID) 125  MCG tablet, Take 1 tablet (125 mcg total) by mouth daily before breakfast., Disp: 90 tablet, Rfl: 1 .  Multiple Vitamin (MULTIVITAMIN) tablet, Take 1 tablet by mouth daily., Disp: , Rfl:  .  nitroGLYCERIN (NITROSTAT) 0.4 MG SL tablet, DISSOLVE 1 TABLET UNDER TONGUE AS NEEDED FOR CHEST PAIN,MAY REPEAT IN5 MINUTES FOR 2 DOSES., Disp: 25 tablet, Rfl: 3 .  potassium chloride SA (K-DUR) 20 MEQ tablet, Take 1 tablet (20 mEq total) by mouth daily., Disp: 90 tablet, Rfl: 1 .  SPIRIVA HANDIHALER 18 MCG inhalation capsule, PLACE 1 CAPSULE INTO INHALER AND INHALE ONCE DAILY. (Patient taking differently: Place 18 mcg into inhaler and inhale daily. ), Disp: 90 capsule, Rfl: 0 .  temazepam (RESTORIL) 30 MG capsule, TAKE 1 CAPSULE AT BEDTIME AS NEEDED FOR SLEEP. (Patient taking differently: Take 30 mg by mouth at bedtime as needed for sleep. ), Disp: 30 capsule, Rfl: 0 .  traMADol (ULTRAM) 50 MG tablet, Take 1 tablet (50 mg total) by mouth every 6 (six) hours as needed., Disp: 12 tablet, Rfl: 0 Allergies  Allergen Reactions  . Metoprolol Shortness Of Breath, Palpitations and Other (See Comments)    Heart starts racing. Shallow breathing; pt states he also get skin irritation on his legs  . Metformin Hives and Swelling    On legs  . Metformin And Related Nausea And Vomiting      Social History   Socioeconomic History  . Marital status: Married    Spouse name: Not on file  . Number of children: Not on file  . Years of education: Not on file  . Highest education level: Not on file  Occupational History  . 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 Financial resource strain: Not on file  . Food insecurity    Worry: Not on file    Inability: Not on file  . Transportation needs    Medical: Not on file    Non-medical: Not on file  Tobacco Use  . Smoking status: Former Smoker    Packs/day: 1.50    Years: 35.00    Pack years: 52.50    Types: Cigarettes  . Smokeless tobacco: Never Used  . Tobacco  comment: quit atleast 25 yrs ago, per pt  Substance and Sexual Activity  . Alcohol use: Yes    Alcohol/week: 3.0 standard drinks    Types: 3 Shots of liquor per week    Comment: socially  . Drug use: No  . Sexual activity: Yes  Lifestyle  . Physical activity    Days per week: Not on file    Minutes per session: Not on file  . Stress: Not on file  Relationships  . Social Herbalist on phone: Not on file    Gets together: Not on file    Attends religious service: Not on file    Active member of club or organization: Not on file    Attends meetings of clubs or organizations: Not on file    Relationship status: Not on file  . Intimate partner  violence    Fear of current or ex partner: Not on file    Emotionally abused: Not on file    Physically abused: Not on file    Forced sexual activity: Not on file  Other Topics Concern  . Not on file  Social History Narrative   Pt lives in [place] with spouse.   Retired from [employer].  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    Physical Exam Cardiovascular:     Rate and Rhythm: Normal rate and regular rhythm.     Pulses: Normal pulses.  Pulmonary:     Effort: Pulmonary effort is normal.     Breath sounds: Normal breath sounds.  Abdominal:     General: Abdomen is flat. There is no distension.  Musculoskeletal: Normal range of motion.     Right lower leg: No edema.     Left lower leg: No edema.  Skin:    General: Skin is warm and dry.     Capillary Refill: Capillary refill takes less than 2 seconds.  Neurological:     Mental Status: He is alert and oriented to person, place, and time.  Psychiatric:        Mood and Affect: Mood normal.         Future Appointments  Date Time Provider [place]  08/27/2019  2:45 PM Gardiner Barefoot, DPM [facility]  10/30/2019 10:20 AM Larey Dresser, MD [facility] None    BP 122/68 (BP Location: Left Arm, Patient  Position: Sitting, Cuff Size: Normal)   Pulse 80   Resp 16   Wt 186 lb (84.4 kg)   SpO2 92%   BMI 28.28 kg/m   Weight yesterday- Did not weigh Last visit weight- 191 lb  Mr Glasheen was seen at home today and reported feeling well. He denied chest pain, SOB, headache, dizziness, orthopnea, cough or fever since our last visit. He has been compliant with his medications over the past week and his weight decreased after the extra lasix was given this week. His medications were verified and his pillbox was refilled. I sent a staff message to Garlan Fair, [place], requesting a new prescription be sent to the pharmacy as his current prescription is out of refills. Also during my visit I aided Mr Buckbee in a virtual visit with his PCP. No changes were made secondary to that encounter. I will follow up next week.  [name], EMT 08/01/19  ACTION: Home visit completed Next visit planned for 1 week

## 2019-08-01 NOTE — Assessment & Plan Note (Signed)
Anticoagulated on Eliquis.  Continue management per cardiology. 

## 2019-08-01 NOTE — Assessment & Plan Note (Signed)
Continue levothyroxine 125 mcg daily.  Check TSH with next blood draw.

## 2019-08-08 ENCOUNTER — Other Ambulatory Visit (HOSPITAL_COMMUNITY): Payer: Self-pay

## 2019-08-08 NOTE — Progress Notes (Signed)
Paramedicine Encounter    Patient ID: Andrew [name], male    DOB: [birth year], 85 y.o.   MRN: [id number]   Patient Care Team: [name], MD as PCP - General (Family Medicine) Larey Dresser, MD as PCP - Advanced Heart Failure (Cardiology) Larey Dresser, MD as Consulting Physician (Cardiology) Jorge Ny, LCSW as Social Worker (Licensed Clinical Social Worker)  Patient Active Problem List   Diagnosis Date Noted  . Chronic respiratory failure with hypoxia ([place]) 09/16/2018  . Bilateral recurrent inguinal hernias 11/28/2017  . Carotid artery stenosis 01/12/2016  . Chronic systolic CHF (congestive heart failure) ([place]) 12/02/2015  . 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 ([place]) 10/16/2015  . PVD (peripheral vascular disease) ([place])   . Hereditary and idiopathic peripheral neuropathy 10/02/2015  . Peripelvic (lymphatic) cyst   . Pernicious anemia 07/15/2015  . Bilateral renal cysts 07/12/2015  . Lung nodule, 32mm RML CT 07/12/15 07/12/2015  . Stage 3 chronic kidney disease ([place]) 06/14/2015  . Paroxysmal atrial fibrillation ([place]) 05/28/2015  . AAA (abdominal aortic aneurysm) without rupture ([place]) 09/30/2014  . Cardiomyopathy, ischemic 05/23/2014  . DM (diabetes mellitus), type 2 with renal complications ([place]) 27/25/3664  . Depression, major, single episode, complete remission ([place]) 03/18/2010  . Hypothyroidism 02/03/2009  . Osteoarthritis 05/27/2008  . Hyperlipidemia associated with type 2 diabetes mellitus ([place]) 06/14/2007  . Hypertension associated with diabetes ([place]) 06/14/2007  . GERD 06/14/2007    Current Outpatient Medications:  .  acetaminophen (TYLENOL) 500 MG tablet, Take 1,000 mg by mouth every 6 (six) hours as needed for mild pain or headache., Disp: , Rfl:  .  amiodarone (PACERONE) 200 MG tablet, Take 0.5 tablets (100 mg total) by mouth daily., Disp: 45 tablet, Rfl: 3 .  apixaban (ELIQUIS) 2.5 MG TABS tablet, Take 1 tablet (2.5 mg total) by mouth 2  (two) times daily., Disp: 180 tablet, Rfl: 0 .  atorvastatin (LIPITOR) 80 MG tablet, Take 1 tablet (80 mg total) by mouth daily., Disp: 30 tablet, Rfl: 6 .  carvedilol (COREG) 12.5 MG tablet, Take 1 tablet (12.5 mg total) by mouth 2 (two) times daily., Disp: 180 tablet, Rfl: 3 .  cilostazol (PLETAL) 100 MG tablet, Take 1 tablet (100 mg total) by mouth 2 (two) times daily., Disp: 180 tablet, Rfl: 3 .  ezetimibe (ZETIA) 10 MG tablet, TAKE (1) TABLET DAILY., Disp: 90 tablet, Rfl: 0 .  FLUoxetine (PROZAC) 20 MG capsule, TAKE (1) CAPSULE DAILY. (Patient taking differently: Take 20 mg by mouth daily. ), Disp: 90 capsule, Rfl: 0 .  furosemide (LASIX) 80 MG tablet, TAKE 1&1/2 TABLETS IN THE MORNING AND 1 TABLET IN THE AFTERNOON., Disp: 225 tablet, Rfl: 6 .  gabapentin (NEURONTIN) 100 MG capsule, Take 100 mg by mouth 3 (three) times daily., Disp: , Rfl:  .  hydrALAZINE (APRESOLINE) 50 MG tablet, Take 1 tablet (50 mg total) by mouth 3 (three) times daily., Disp: 270 tablet, Rfl: 1 .  HYDROcodone-acetaminophen (NORCO/VICODIN) 5-325 MG tablet, Take 1 tablet by mouth daily as needed for moderate pain. , Disp: , Rfl:  .  isosorbide mononitrate (IMDUR) 30 MG 24 hr tablet, TAKE 1 TABLET EACH DAY., Disp: 90 tablet, Rfl: 0 .  levothyroxine (SYNTHROID, LEVOTHROID) 125 MCG tablet, Take 1 tablet (125 mcg total) by mouth daily before breakfast., Disp: 90 tablet, Rfl: 1 .  Multiple Vitamin (MULTIVITAMIN) tablet, Take 1 tablet by mouth daily., Disp: , Rfl:  .  potassium chloride SA (K-DUR) 20 MEQ tablet, Take  1 tablet (20 mEq total) by mouth daily., Disp: 90 tablet, Rfl: 1 .  SPIRIVA HANDIHALER 18 MCG inhalation capsule, PLACE 1 CAPSULE INTO INHALER AND INHALE ONCE DAILY. (Patient taking differently: Place 18 mcg into inhaler and inhale daily. ), Disp: 90 capsule, Rfl: 0 .  temazepam (RESTORIL) 30 MG capsule, TAKE 1 CAPSULE AT BEDTIME AS NEEDED FOR SLEEP. (Patient taking differently: Take 30 mg by mouth at bedtime as needed  for sleep. ), Disp: 30 capsule, Rfl: 0 .  traMADol (ULTRAM) 50 MG tablet, Take 1 tablet (50 mg total) by mouth every 6 (six) hours as needed., Disp: 12 tablet, Rfl: 0 .  glucose blood (ACCU-CHEK AVIVA PLUS) test strip, CHECK BLOOD SUGAR 4 TIMES A DAY., Disp: 100 each, Rfl: 11 .  Influenza Vac A&B Surf Ant Adj (FLUAD) 0.5 ML SUSY, , Disp: , Rfl:  .  Lancets MISC, Check blood sugar 4 times a day, Disp: 100 each, Rfl: 11 .  nitroGLYCERIN (NITROSTAT) 0.4 MG SL tablet, DISSOLVE 1 TABLET UNDER TONGUE AS NEEDED FOR CHEST PAIN,MAY REPEAT IN5 MINUTES FOR 2 DOSES. (Patient not taking: Reported on 08/08/2019), Disp: 25 tablet, Rfl: 3 Allergies  Allergen Reactions  . Metoprolol Shortness Of Breath, Palpitations and Other (See Comments)    Heart starts racing. Shallow breathing; pt states he also get skin irritation on his legs  . Metformin Hives and Swelling    On legs  . Metformin And Related Nausea And Vomiting      Social History   Socioeconomic History  . Marital status: Married    Spouse name: Not on file  . Number of children: Not on file  . Years of education: Not on file  . Highest education level: Not on file  Occupational History  . 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 Financial resource strain: Not on file  . Food insecurity    Worry: Not on file    Inability: Not on file  . Transportation needs    Medical: Not on file    Non-medical: Not on file  Tobacco Use  . Smoking status: Former Smoker    Packs/day: 1.50    Years: 35.00    Pack years: 52.50    Types: Cigarettes  . Smokeless tobacco: Never Used  . Tobacco comment: quit atleast 25 yrs ago, per pt  Substance and Sexual Activity  . Alcohol use: Yes    Alcohol/week: 3.0 standard drinks    Types: 3 Shots of liquor per week    Comment: socially  . Drug use: No  . Sexual activity: Yes  Lifestyle  . Physical activity    Days per week: Not on file    Minutes per session: Not on file  . Stress: Not on file   Relationships  . Social Herbalist on phone: Not on file    Gets together: Not on file    Attends religious service: Not on file    Active member of club or organization: Not on file    Attends meetings of clubs or organizations: Not on file    Relationship status: Not on file  . Intimate partner violence    Fear of current or ex partner: Not on file    Emotionally abused: Not on file    Physically abused: Not on file    Forced sexual activity: Not on file  Other Topics Concern  . Not on file  Social History Narrative   Pt lives in [place] with spouse.  Retired from [employer].  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    Physical Exam Cardiovascular:     Rate and Rhythm: Normal rate and regular rhythm.     Pulses: Normal pulses.  Pulmonary:     Effort: Pulmonary effort is normal.     Breath sounds: Normal breath sounds.  Musculoskeletal: Normal range of motion.     Right lower leg: No edema.     Left lower leg: No edema.  Skin:    General: Skin is warm and dry.     Capillary Refill: Capillary refill takes less than 2 seconds.  Neurological:     Mental Status: He is alert and oriented to person, place, and time.  Psychiatric:        Mood and Affect: Mood normal.         Future Appointments  Date Time Provider [place]  08/27/2019  2:45 PM Gardiner Barefoot, DPM [facility]  10/30/2019 10:20 AM Larey Dresser, MD [facility] None  10/31/2019  1:20 PM [name], MD [facility]    BP 125/61 (BP Location: Left Arm, Patient Position: Sitting, Cuff Size: Normal)   Pulse 72   Resp 18   Wt 191 lb (86.6 kg)   SpO2 92%   BMI 29.04 kg/m   Weight yesterday- Did not weigh Last visit weight- 186 lb  Mr Penrod was seen at home today and rpeorted feeling well. He denied chest pain, SOB, headache, dizziness, orthopnea, cough or fever since our last visit. He stated he has been compliant with  his medications over the past week however he had missed 4 afternoon slots in his pillbox. His weight was e;evated this week but of the medications he missed, several were his lasix. This was brought to his attention and he said he would do better going forward. His medications were verified and his pillbox was refilled. I will have someone follow up with his next week.   [name], EMT 08/08/19  ACTION: Home visit completed Next visit planned for 1 week

## 2019-08-15 ENCOUNTER — Other Ambulatory Visit (HOSPITAL_COMMUNITY): Payer: Self-pay

## 2019-08-15 NOTE — Progress Notes (Signed)
Paramedicine Encounter    Patient ID: Andrew [name], male    DOB: 1928/03/10, 85 y.o.   MRN: [id number]   Patient Care Team: [name], MD as PCP - General (Family Medicine) Larey Dresser, MD as PCP - Advanced Heart Failure (Cardiology) Larey Dresser, MD as Consulting Physician (Cardiology) Jorge Ny, LCSW as Social Worker (Licensed Clinical Social Worker)  Patient Active Problem List   Diagnosis Date Noted  . Chronic respiratory failure with hypoxia ([place]) 09/16/2018  . Bilateral recurrent inguinal hernias 11/28/2017  . Carotid artery stenosis 01/12/2016  . Chronic systolic CHF (congestive heart failure) ([place]) 12/02/2015  . 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 ([place]) 10/16/2015  . PVD (peripheral vascular disease) ([place])   . Hereditary and idiopathic peripheral neuropathy 10/02/2015  . Peripelvic (lymphatic) cyst   . Pernicious anemia 07/15/2015  . Bilateral renal cysts 07/12/2015  . Lung nodule, 26mm RML CT 07/12/15 07/12/2015  . Stage 3 chronic kidney disease ([place]) 06/14/2015  . Paroxysmal atrial fibrillation ([place]) 05/28/2015  . AAA (abdominal aortic aneurysm) without rupture ([place]) 09/30/2014  . Cardiomyopathy, ischemic 05/23/2014  . DM (diabetes mellitus), type 2 with renal complications ([place]) 25/85/2778  . Depression, major, single episode, complete remission ([place]) 03/18/2010  . Hypothyroidism 02/03/2009  . Osteoarthritis 05/27/2008  . Hyperlipidemia associated with type 2 diabetes mellitus ([place]) 06/14/2007  . Hypertension associated with diabetes ([place]) 06/14/2007  . GERD 06/14/2007    Current Outpatient Medications:  .  acetaminophen (TYLENOL) 500 MG tablet, Take 1,000 mg by mouth every 6 (six) hours as needed for mild pain or headache., Disp: , Rfl:  .  amiodarone (PACERONE) 200 MG tablet, Take 0.5 tablets (100 mg total) by mouth daily., Disp: 45 tablet, Rfl: 3 .  apixaban (ELIQUIS) 2.5 MG TABS tablet, Take 1 tablet (2.5 mg total) by mouth 2  (two) times daily., Disp: 180 tablet, Rfl: 0 .  atorvastatin (LIPITOR) 80 MG tablet, Take 1 tablet (80 mg total) by mouth daily., Disp: 30 tablet, Rfl: 6 .  carvedilol (COREG) 12.5 MG tablet, Take 1 tablet (12.5 mg total) by mouth 2 (two) times daily., Disp: 180 tablet, Rfl: 3 .  cilostazol (PLETAL) 100 MG tablet, Take 1 tablet (100 mg total) by mouth 2 (two) times daily., Disp: 180 tablet, Rfl: 3 .  ezetimibe (ZETIA) 10 MG tablet, TAKE (1) TABLET DAILY., Disp: 90 tablet, Rfl: 0 .  FLUoxetine (PROZAC) 20 MG capsule, TAKE (1) CAPSULE DAILY. (Patient taking differently: Take 20 mg by mouth daily. ), Disp: 90 capsule, Rfl: 0 .  furosemide (LASIX) 80 MG tablet, TAKE 1&1/2 TABLETS IN THE MORNING AND 1 TABLET IN THE AFTERNOON., Disp: 225 tablet, Rfl: 6 .  gabapentin (NEURONTIN) 100 MG capsule, Take 100 mg by mouth 3 (three) times daily., Disp: , Rfl:  .  glucose blood (ACCU-CHEK AVIVA PLUS) test strip, CHECK BLOOD SUGAR 4 TIMES A DAY., Disp: 100 each, Rfl: 11 .  hydrALAZINE (APRESOLINE) 50 MG tablet, Take 1 tablet (50 mg total) by mouth 3 (three) times daily., Disp: 270 tablet, Rfl: 1 .  isosorbide mononitrate (IMDUR) 30 MG 24 hr tablet, TAKE 1 TABLET EACH DAY., Disp: 90 tablet, Rfl: 0 .  Lancets MISC, Check blood sugar 4 times a day, Disp: 100 each, Rfl: 11 .  levothyroxine (SYNTHROID, LEVOTHROID) 125 MCG tablet, Take 1 tablet (125 mcg total) by mouth daily before breakfast., Disp: 90 tablet, Rfl: 1 .  Multiple Vitamin (MULTIVITAMIN) tablet, Take 1 tablet by mouth daily.,  Disp: , Rfl:  .  potassium chloride SA (K-DUR) 20 MEQ tablet, Take 1 tablet (20 mEq total) by mouth daily., Disp: 90 tablet, Rfl: 1 .  SPIRIVA HANDIHALER 18 MCG inhalation capsule, PLACE 1 CAPSULE INTO INHALER AND INHALE ONCE DAILY. (Patient taking differently: Place 18 mcg into inhaler and inhale daily. ), Disp: 90 capsule, Rfl: 0 .  traMADol (ULTRAM) 50 MG tablet, Take 1 tablet (50 mg total) by mouth every 6 (six) hours as needed.,  Disp: 12 tablet, Rfl: 0 .  HYDROcodone-acetaminophen (NORCO/VICODIN) 5-325 MG tablet, Take 1 tablet by mouth daily as needed for moderate pain. , Disp: , Rfl:  .  Influenza Vac A&B Surf Ant Adj (FLUAD) 0.5 ML SUSY, , Disp: , Rfl:  .  nitroGLYCERIN (NITROSTAT) 0.4 MG SL tablet, DISSOLVE 1 TABLET UNDER TONGUE AS NEEDED FOR CHEST PAIN,MAY REPEAT IN5 MINUTES FOR 2 DOSES. (Patient not taking: Reported on 08/08/2019), Disp: 25 tablet, Rfl: 3 .  temazepam (RESTORIL) 30 MG capsule, TAKE 1 CAPSULE AT BEDTIME AS NEEDED FOR SLEEP. (Patient not taking: No sig reported), Disp: 30 capsule, Rfl: 0 Allergies  Allergen Reactions  . Metoprolol Shortness Of Breath, Palpitations and Other (See Comments)    Heart starts racing. Shallow breathing; pt states he also get skin irritation on his legs  . Metformin Hives and Swelling    On legs  . Metformin And Related Nausea And Vomiting      Social History   Socioeconomic History  . Marital status: Married    Spouse name: Not on file  . Number of children: Not on file  . Years of education: Not on file  . Highest education level: Not on file  Occupational History  . 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 Financial resource strain: Not on file  . Food insecurity    Worry: Not on file    Inability: Not on file  . Transportation needs    Medical: Not on file    Non-medical: Not on file  Tobacco Use  . Smoking status: Former Smoker    Packs/day: 1.50    Years: 35.00    Pack years: 52.50    Types: Cigarettes  . Smokeless tobacco: Never Used  . Tobacco comment: quit atleast 25 yrs ago, per pt  Substance and Sexual Activity  . Alcohol use: Yes    Alcohol/week: 3.0 standard drinks    Types: 3 Shots of liquor per week    Comment: socially  . Drug use: No  . Sexual activity: Yes  Lifestyle  . Physical activity    Days per week: Not on file    Minutes per session: Not on file  . Stress: Not on file  Relationships  . Social Herbalist on  phone: Not on file    Gets together: Not on file    Attends religious service: Not on file    Active member of club or organization: Not on file    Attends meetings of clubs or organizations: Not on file    Relationship status: Not on file  . Intimate partner violence    Fear of current or ex partner: Not on file    Emotionally abused: Not on file    Physically abused: Not on file    Forced sexual activity: Not on file  Other Topics Concern  . Not on file  Social History Narrative   Pt lives in [place] with spouse.   Retired from [employer].  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    Physical Exam      Future Appointments  Date Time Provider [place]  08/27/2019  2:45 PM Gardiner Barefoot, DPM [facility]  10/30/2019 10:20 AM Larey Dresser, MD [facility] None  10/31/2019  1:20 PM [name], MD [facility]    BP 118/60   Pulse 78   Temp (!) 97.3 F (36.3 C)   Resp 18   Wt 188 lb (85.3 kg)   SpO2 90%   BMI 28.59 kg/m   Weight yesterday-186  Last visit weight-191  Pt reports feeling ok, his wife was telling me she is concerned about his forgetfulness here lately, he has turned on the stove to cook bacon and he forgot it was on, when wife returned home to find it burnt and stove still on and pt had went to living room to read a book. He also turned on the oven and forgot about it and didn't remember doing that. Wife now turns off the fuse/breaker to the oven/stove when she leaves. She will address this with his PCP.  He missed one dose of his meds this week--it was his Monday evening dose.  meds verified and pill box refilled.  Ordered gabapentin for refill.  Weights been between [place], [place] Paramedic  08/15/19

## 2019-08-22 ENCOUNTER — Other Ambulatory Visit (HOSPITAL_COMMUNITY): Payer: Self-pay

## 2019-08-22 NOTE — Progress Notes (Signed)
Paramedicine Encounter    Patient ID: Andrew [name], male    DOB: 1928-11-08, 85 y.o.   MRN: [id number]   Patient Care Team: [name], MD as PCP - General (Family Medicine) Larey Dresser, MD as PCP - Advanced Heart Failure (Cardiology) Larey Dresser, MD as Consulting Physician (Cardiology) Jorge Ny, LCSW as Social Worker (Licensed Clinical Social Worker)  Patient Active Problem List   Diagnosis Date Noted  . Chronic respiratory failure with hypoxia ([place]) 09/16/2018  . Bilateral recurrent inguinal hernias 11/28/2017  . Carotid artery stenosis 01/12/2016  . Chronic systolic CHF (congestive heart failure) ([place]) 12/02/2015  . 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 PVD (peripheral vascular disease) ([place])   . Hereditary and idiopathic peripheral neuropathy 10/02/2015  . Peripelvic (lymphatic) cyst   . Pernicious anemia 07/15/2015  . Bilateral renal cysts 07/12/2015  . Lung nodule, 29mm RML CT 07/12/15 07/12/2015  . Stage 3 chronic kidney disease ([place]) 06/14/2015  . Paroxysmal atrial fibrillation ([place]) 05/28/2015  . AAA (abdominal aortic aneurysm) without rupture ([place]) 09/30/2014  . Cardiomyopathy, ischemic 05/23/2014  . DM (diabetes mellitus), type 2 with renal complications ([place]) 96/28/3662  . Depression, major, single episode, complete remission ([place]) 03/18/2010  . Hypothyroidism 02/03/2009  . Osteoarthritis 05/27/2008  . Hyperlipidemia associated with type 2 diabetes mellitus ([place]) 06/14/2007  . Hypertension associated with diabetes ([place]) 06/14/2007  . GERD 06/14/2007    Current Outpatient Medications:  .  acetaminophen (TYLENOL) 500 MG tablet, Take 1,000 mg by mouth every 6 (six) hours as needed for mild pain or headache., Disp: , Rfl:  .  amiodarone (PACERONE) 200 MG tablet, Take 0.5 tablets (100 mg total) by mouth daily., Disp: 45 tablet, Rfl: 3 .  apixaban (ELIQUIS) 2.5 MG TABS tablet, Take 1 tablet (2.5 mg total) by mouth 2  (two) times daily., Disp: 180 tablet, Rfl: 0 .  atorvastatin (LIPITOR) 80 MG tablet, Take 1 tablet (80 mg total) by mouth daily., Disp: 30 tablet, Rfl: 6 .  carvedilol (COREG) 12.5 MG tablet, Take 1 tablet (12.5 mg total) by mouth 2 (two) times daily., Disp: 180 tablet, Rfl: 3 .  cilostazol (PLETAL) 100 MG tablet, Take 1 tablet (100 mg total) by mouth 2 (two) times daily., Disp: 180 tablet, Rfl: 3 .  ezetimibe (ZETIA) 10 MG tablet, TAKE (1) TABLET DAILY., Disp: 90 tablet, Rfl: 0 .  FLUoxetine (PROZAC) 20 MG capsule, TAKE (1) CAPSULE DAILY. (Patient taking differently: Take 20 mg by mouth daily. ), Disp: 90 capsule, Rfl: 0 .  furosemide (LASIX) 80 MG tablet, TAKE 1&1/2 TABLETS IN THE MORNING AND 1 TABLET IN THE AFTERNOON., Disp: 225 tablet, Rfl: 6 .  gabapentin (NEURONTIN) 100 MG capsule, Take 100 mg by mouth 3 (three) times daily., Disp: , Rfl:  .  glucose blood (ACCU-CHEK AVIVA PLUS) test strip, CHECK BLOOD SUGAR 4 TIMES A DAY., Disp: 100 each, Rfl: 11 .  hydrALAZINE (APRESOLINE) 50 MG tablet, Take 1 tablet (50 mg total) by mouth 3 (three) times daily., Disp: 270 tablet, Rfl: 1 .  HYDROcodone-acetaminophen (NORCO/VICODIN) 5-325 MG tablet, Take 1 tablet by mouth daily as needed for moderate pain. , Disp: , Rfl:  .  isosorbide mononitrate (IMDUR) 30 MG 24 hr tablet, TAKE 1 TABLET EACH DAY., Disp: 90 tablet, Rfl: 0 .  levothyroxine (SYNTHROID, LEVOTHROID) 125 MCG tablet, Take 1 tablet (125 mcg total) by mouth daily before breakfast., Disp: 90 tablet, Rfl: 1 .  Multiple Vitamin (MULTIVITAMIN)  tablet, Take 1 tablet by mouth daily., Disp: , Rfl:  .  potassium chloride SA (K-DUR) 20 MEQ tablet, Take 1 tablet (20 mEq total) by mouth daily., Disp: 90 tablet, Rfl: 1 .  SPIRIVA HANDIHALER 18 MCG inhalation capsule, PLACE 1 CAPSULE INTO INHALER AND INHALE ONCE DAILY. (Patient taking differently: Place 18 mcg into inhaler and inhale daily. ), Disp: 90 capsule, Rfl: 0 .  temazepam (RESTORIL) 30 MG capsule, TAKE 1  CAPSULE AT BEDTIME AS NEEDED FOR SLEEP., Disp: 30 capsule, Rfl: 0 .  Influenza Vac A&B Surf Ant Adj (FLUAD) 0.5 ML SUSY, , Disp: , Rfl:  .  Lancets MISC, Check blood sugar 4 times a day, Disp: 100 each, Rfl: 11 .  nitroGLYCERIN (NITROSTAT) 0.4 MG SL tablet, DISSOLVE 1 TABLET UNDER TONGUE AS NEEDED FOR CHEST PAIN,MAY REPEAT IN5 MINUTES FOR 2 DOSES. (Patient not taking: Reported on 08/08/2019), Disp: 25 tablet, Rfl: 3 .  traMADol (ULTRAM) 50 MG tablet, Take 1 tablet (50 mg total) by mouth every 6 (six) hours as needed. (Patient not taking: Reported on 08/22/2019), Disp: 12 tablet, Rfl: 0 Allergies  Allergen Reactions  . Metoprolol Shortness Of Breath, Palpitations and Other (See Comments)    Heart starts racing. Shallow breathing; pt states he also get skin irritation on his legs  . Metformin Hives and Swelling    On legs  . Metformin And Related Nausea And Vomiting      Social History   Socioeconomic History  . Marital status: Married    Spouse name: Not on file  . Number of children: Not on file  . Years of education: Not on file  . Highest education level: Not on file  Occupational History  . 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 Financial resource strain: Not on file  . Food insecurity    Worry: Not on file    Inability: Not on file  . Transportation needs    Medical: Not on file    Non-medical: Not on file  Tobacco Use  . Smoking status: Former Smoker    Packs/day: 1.50    Years: 35.00    Pack years: 52.50    Types: Cigarettes  . Smokeless tobacco: Never Used  . Tobacco comment: quit atleast 25 yrs ago, per pt  Substance and Sexual Activity  . Alcohol use: Yes    Alcohol/week: 3.0 standard drinks    Types: 3 Shots of liquor per week    Comment: socially  . Drug use: No  . Sexual activity: Yes  Lifestyle  . Physical activity    Days per week: Not on file    Minutes per session: Not on file  . Stress: Not on file  Relationships  . Social Herbalist on  phone: Not on file    Gets together: Not on file    Attends religious service: Not on file    Active member of club or organization: Not on file    Attends meetings of clubs or organizations: Not on file    Relationship status: Not on file  . Intimate partner violence    Fear of current or ex partner: Not on file    Emotionally abused: Not on file    Physically abused: Not on file    Forced sexual activity: Not on file  Other Topics Concern  . Not on file  Social History Narrative   Pt lives in [place] with spouse.   Retired from [employer].  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    Physical Exam Cardiovascular:     Rate and Rhythm: Normal rate and regular rhythm.     Pulses: Normal pulses.  Pulmonary:     Effort: Pulmonary effort is normal.     Breath sounds: Normal breath sounds.  Abdominal:     General: Abdomen is flat. There is no distension.  Musculoskeletal: Normal range of motion.     Right lower leg: No edema.     Left lower leg: No edema.  Skin:    General: Skin is warm and dry.     Capillary Refill: Capillary refill takes less than 2 seconds.  Neurological:     Mental Status: He is alert and oriented to person, place, and time.  Psychiatric:        Mood and Affect: Mood normal.         Future Appointments  Date Time Provider [name]  08/27/2019  2:45 PM Gardiner Barefoot, DPM [facility]  10/30/2019 10:20 AM Larey Dresser, MD [facility] None  10/31/2019  1:20 PM [name], MD [facility]  10/31/2019  1:45 PM [facility] HEALTH COACH [facility]    BP 133/78 (BP Location: Left Arm, Patient Position: Sitting, Cuff Size: Normal)   Pulse 75   Resp 18   Wt 186 lb (84.4 kg)   SpO2 91%   BMI 28.28 kg/m   Weight yesterday- 186 lb Last visit weight- 188 lb  Mr Freel was seen at home today and reported feeling well. He denied chest pain, SOB, headache, dizziness, increased orthopnea,  cough or fever since his last home visit. He reported being compliant with his medication over the past week and his weight has been stable His medications were verified and his pillbox was refilled.   [name], EMT 08/22/19  ACTION: Home visit completed Next visit planned for 1 week

## 2019-08-27 ENCOUNTER — Encounter: Payer: Self-pay | Admitting: Podiatry

## 2019-08-27 ENCOUNTER — Other Ambulatory Visit: Payer: Self-pay

## 2019-08-27 ENCOUNTER — Ambulatory Visit: Payer: Medicare Other | Admitting: Podiatry

## 2019-08-27 DIAGNOSIS — E1159 Type 2 diabetes mellitus with other circulatory complications: Secondary | ICD-10-CM | POA: Diagnosis not present

## 2019-08-27 DIAGNOSIS — I739 Peripheral vascular disease, unspecified: Secondary | ICD-10-CM | POA: Diagnosis not present

## 2019-08-27 DIAGNOSIS — B351 Tinea unguium: Secondary | ICD-10-CM | POA: Diagnosis not present

## 2019-08-27 DIAGNOSIS — M79676 Pain in unspecified toe(s): Secondary | ICD-10-CM

## 2019-08-27 NOTE — Progress Notes (Signed)
Patient ID: Andrew [name], male   DOB: 05/06/1928, 85 y.o.   MRN: [id number] Complaint:  Visit Type: Patient returns to my office for continued preventative foot care services. Complaint: Patient states" my nails have grown long and thick and become painful to walk and wear shoes" Patient has been diagnosed with DM with no foot complications. The patient presents for preventative foot care services. No changes to ROS.  Patient is on eliquiss and pletal.  Podiatric Exam: Vascular: dorsalis pedis and posterior tibial pulses are palpable bilateral. Capillary return is immediate. Temperature gradient is WNL. Skin turgor WNL  Sensorium: Normal Semmes Weinstein monofilament test. Normal tactile sensation bilaterally. Nail Exam: Pt has thick disfigured discolored nails with subungual debris noted bilateral entire nail hallux through fifth toenails Ulcer Exam: There is no evidence of ulcer or pre-ulcerative changes or infection. Orthopedic Exam: Muscle tone and strength are WNL. No limitations in general ROM. No crepitus or effusions noted. Foot type and digits show no abnormalities. Bony prominences are unremarkable. 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 Skin: No Porokeratosis. No infection or ulcers.  Asymptomatic callus right foot  Diagnosis:  Onychomycosis, , Pain in right toe, pain in left toes  Treatment & Plan Procedures and Treatment: Consent by patient was obtained for treatment procedures. The patient understood the discussion of treatment and procedures well. All questions were answered thoroughly reviewed. Debridement of mycotic and hypertrophic toenails, 1 through 5 bilateral and clearing of subungual debris. No ulceration, no infection noted.  Return Visit-Office Procedure: Patient instructed to return to the office for a follow up visit 10 weeks. for continued evaluation and treatment.   Dovid Bartko DPM 

## 2019-08-29 ENCOUNTER — Other Ambulatory Visit (HOSPITAL_COMMUNITY): Payer: Self-pay

## 2019-08-29 ENCOUNTER — Other Ambulatory Visit (HOSPITAL_COMMUNITY): Payer: Self-pay | Admitting: *Deleted

## 2019-08-29 MED ORDER — AMIODARONE HCL 200 MG PO TABS: 100.0000 mg | ORAL_TABLET | Freq: Every day | ORAL | 3 refills | Status: DC

## 2019-08-29 MED ORDER — ISOSORBIDE MONONITRATE ER 30 MG PO TB24: ORAL_TABLET | ORAL | 0 refills | Status: DC

## 2019-08-29 NOTE — Progress Notes (Signed)
Paramedicine Encounter    Patient ID: Andrew [name], male    DOB: [birth year], 85 y.o.   MRN: [id number]   Patient Care Team: [name], MD as PCP - General (Family Medicine) Larey Dresser, MD as PCP - Advanced Heart Failure (Cardiology) Larey Dresser, MD as Consulting Physician (Cardiology) Jorge Ny, LCSW as Social Worker (Licensed Clinical Social Worker)  Patient Active Problem List   Diagnosis Date Noted  . Chronic respiratory failure with hypoxia ([place]) 09/16/2018  . Bilateral recurrent inguinal hernias 11/28/2017  . Carotid artery stenosis 01/12/2016  . Chronic systolic CHF (congestive heart failure) ([place]) 12/02/2015  . 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 ([place]) 10/16/2015  . PVD (peripheral vascular disease) ([place])   . Hereditary and idiopathic peripheral neuropathy 10/02/2015  . Peripelvic (lymphatic) cyst   . Pernicious anemia 07/15/2015  . Bilateral renal cysts 07/12/2015  . Lung nodule, 40mm RML CT 07/12/15 07/12/2015  . Stage 3 chronic kidney disease ([place]) 06/14/2015  . Paroxysmal atrial fibrillation ([place]) 05/28/2015  . AAA (abdominal aortic aneurysm) without rupture ([place]) 09/30/2014  . Cardiomyopathy, ischemic 05/23/2014  . DM (diabetes mellitus), type 2 with renal complications ([place]) 31/51/7616  . Depression, major, single episode, complete remission ([place]) 03/18/2010  . Hypothyroidism 02/03/2009  . Osteoarthritis 05/27/2008  . Hyperlipidemia associated with type 2 diabetes mellitus ([place]) 06/14/2007  . Hypertension associated with diabetes ([place]) 06/14/2007  . GERD 06/14/2007    Current Outpatient Medications:  .  acetaminophen (TYLENOL) 500 MG tablet, Take 1,000 mg by mouth every 6 (six) hours as needed for mild pain or headache., Disp: , Rfl:  .  apixaban (ELIQUIS) 2.5 MG TABS tablet, Take 1 tablet (2.5 mg total) by mouth 2 (two) times daily., Disp: 180 tablet, Rfl: 0 .  atorvastatin (LIPITOR) 80 MG tablet, Take 1 tablet (80 mg total) by  mouth daily., Disp: 30 tablet, Rfl: 6 .  carvedilol (COREG) 12.5 MG tablet, Take 1 tablet (12.5 mg total) by mouth 2 (two) times daily., Disp: 180 tablet, Rfl: 3 .  cilostazol (PLETAL) 100 MG tablet, Take 1 tablet (100 mg total) by mouth 2 (two) times daily., Disp: 180 tablet, Rfl: 3 .  ezetimibe (ZETIA) 10 MG tablet, TAKE (1) TABLET DAILY., Disp: 90 tablet, Rfl: 0 .  FLUoxetine (PROZAC) 20 MG capsule, TAKE (1) CAPSULE DAILY. (Patient taking differently: Take 20 mg by mouth daily. ), Disp: 90 capsule, Rfl: 0 .  furosemide (LASIX) 80 MG tablet, TAKE 1&1/2 TABLETS IN THE MORNING AND 1 TABLET IN THE AFTERNOON., Disp: 225 tablet, Rfl: 6 .  gabapentin (NEURONTIN) 100 MG capsule, Take 100 mg by mouth 3 (three) times daily., Disp: , Rfl:  .  glucose blood (ACCU-CHEK AVIVA PLUS) test strip, CHECK BLOOD SUGAR 4 TIMES A DAY., Disp: 100 each, Rfl: 11 .  hydrALAZINE (APRESOLINE) 50 MG tablet, Take 1 tablet (50 mg total) by mouth 3 (three) times daily., Disp: 270 tablet, Rfl: 1 .  HYDROcodone-acetaminophen (NORCO/VICODIN) 5-325 MG tablet, Take 1 tablet by mouth daily as needed for moderate pain. , Disp: , Rfl:  .  levothyroxine (SYNTHROID, LEVOTHROID) 125 MCG tablet, Take 1 tablet (125 mcg total) by mouth daily before breakfast., Disp: 90 tablet, Rfl: 1 .  Multiple Vitamin (MULTIVITAMIN) tablet, Take 1 tablet by mouth daily., Disp: , Rfl:  .  nitroGLYCERIN (NITROSTAT) 0.4 MG SL tablet, DISSOLVE 1 TABLET UNDER TONGUE AS NEEDED FOR CHEST PAIN,MAY REPEAT IN5 MINUTES FOR 2 DOSES., Disp: 25 tablet, Rfl: 3 .  potassium chloride SA (K-DUR) 20 MEQ tablet, Take 1 tablet (20 mEq total) by mouth daily., Disp: 90 tablet, Rfl: 1 .  SPIRIVA HANDIHALER 18 MCG inhalation capsule, PLACE 1 CAPSULE INTO INHALER AND INHALE ONCE DAILY. (Patient taking differently: Place 18 mcg into inhaler and inhale daily. ), Disp: 90 capsule, Rfl: 0 .  temazepam (RESTORIL) 30 MG capsule, TAKE 1 CAPSULE AT BEDTIME AS NEEDED FOR SLEEP., Disp: 30  capsule, Rfl: 0 .  amiodarone (PACERONE) 200 MG tablet, Take 0.5 tablets (100 mg total) by mouth daily., Disp: 45 tablet, Rfl: 3 .  Influenza Vac A&B Surf Ant Adj (FLUAD) 0.5 ML SUSY, , Disp: , Rfl:  .  isosorbide mononitrate (IMDUR) 30 MG 24 hr tablet, TAKE 1 TABLET EACH DAY., Disp: 90 tablet, Rfl: 0 .  Lancets MISC, Check blood sugar 4 times a day, Disp: 100 each, Rfl: 11 .  traMADol (ULTRAM) 50 MG tablet, Take 1 tablet (50 mg total) by mouth every 6 (six) hours as needed. (Patient not taking: Reported on 08/29/2019), Disp: 12 tablet, Rfl: 0 Allergies  Allergen Reactions  . Metoprolol Shortness Of Breath, Palpitations and Other (See Comments)    Heart starts racing. Shallow breathing; pt states he also get skin irritation on his legs  . Metformin Hives and Swelling    On legs  . Metformin And Related Nausea And Vomiting      Social History   Socioeconomic History  . Marital status: Married    Spouse name: Not on file  . Number of children: Not on file  . Years of education: Not on file  . Highest education level: Not on file  Occupational History  . 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 Financial resource strain: Not on file  . Food insecurity    Worry: Not on file    Inability: Not on file  . Transportation needs    Medical: Not on file    Non-medical: Not on file  Tobacco Use  . Smoking status: Former Smoker    Packs/day: 1.50    Years: 35.00    Pack years: 52.50    Types: Cigarettes  . Smokeless tobacco: Never Used  . Tobacco comment: quit atleast 25 yrs ago, per pt  Substance and Sexual Activity  . Alcohol use: Yes    Alcohol/week: 3.0 standard drinks    Types: 3 Shots of liquor per week    Comment: socially  . Drug use: No  . Sexual activity: Yes  Lifestyle  . Physical activity    Days per week: Not on file    Minutes per session: Not on file  . Stress: Not on file  Relationships  . Social Herbalist on phone: Not on file    Gets together: Not  on file    Attends religious service: Not on file    Active member of club or organization: Not on file    Attends meetings of clubs or organizations: Not on file    Relationship status: Not on file  . Intimate partner violence    Fear of current or ex partner: Not on file    Emotionally abused: Not on file    Physically abused: Not on file    Forced sexual activity: Not on file  Other Topics Concern  . Not on file  Social History Narrative   Pt lives in [place] with spouse.   Retired from [employer].  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    Physical Exam Cardiovascular:     Rate and Rhythm: Normal rate and regular rhythm.     Pulses: Normal pulses.     Heart sounds: Normal heart sounds.  Pulmonary:     Effort: Pulmonary effort is normal.  Abdominal:     General: Abdomen is flat.  Musculoskeletal: Normal range of motion.     Right lower leg: Edema present.     Left lower leg: Edema present.  Skin:    Capillary Refill: Capillary refill takes less than 2 seconds.  Neurological:     Mental Status: He is oriented to person, place, and time.  Psychiatric:        Mood and Affect: Mood normal.         Future Appointments  Date Time Provider [place]  09/17/2019 10:00 AM [name], MD [facility]  10/30/2019 10:20 AM Larey Dresser, MD [facility] None  10/31/2019  1:20 PM [name], MD [facility]  10/31/2019  1:45 PM [facility] HEALTH COACH [facility]  11/26/2019  2:00 PM Gardiner Barefoot, DPM [facility]    BP (!) 114/56 (BP Location: Left Arm, Patient Position: Sitting, Cuff Size: Normal)   Pulse 77   Resp 18   Wt 192 lb 6.4 oz (87.3 kg)   SpO2 92%   BMI 29.25 kg/m   Weight yesterday- n/a  Last visit weight- 186 lb  Mr Slomski was seen at home today and reported feeling well. He denied chest pain, SOB, headache, dizziness, orthopnea, fever or cough since our last visit. He stated he  has been mostly compliant with his medications over the past week however he had missed a few afternoon doses which contain 80 mg of lasix each. His weight has increased 6 pounds since our last visit, perhaps as a result of these missed doses.  His medications were verified and his pillbox was refilled. All necessary refills were sent to the HF clinic and received By Adolm Joseph, [place].  Ms Arkwright has requested to start giving him and OTC supplement called Prevagen. I added this to his pillbox but advised she should tell Dr Jerline Pain about this at their upcoming appointment. I will follow up next week.   [name], EMT 08/29/19  ACTION: Home visit completed Next visit planned for 1 week

## 2019-09-04 ENCOUNTER — Telehealth (HOSPITAL_COMMUNITY): Payer: Self-pay

## 2019-09-04 NOTE — Telephone Encounter (Signed)
I called Mr Andrew [name] to schedule an appointment. He did not answer and the voicemail was full so I was unable to leave a message. I will try again tomorrow.

## 2019-09-05 ENCOUNTER — Other Ambulatory Visit (HOSPITAL_COMMUNITY): Payer: Self-pay | Admitting: Cardiology

## 2019-09-05 ENCOUNTER — Other Ambulatory Visit (HOSPITAL_COMMUNITY): Payer: Self-pay | Admitting: *Deleted

## 2019-09-05 ENCOUNTER — Other Ambulatory Visit: Payer: Self-pay | Admitting: Family Medicine

## 2019-09-05 ENCOUNTER — Other Ambulatory Visit (HOSPITAL_COMMUNITY): Payer: Self-pay

## 2019-09-05 MED ORDER — ISOSORBIDE MONONITRATE ER 30 MG PO TB24: ORAL_TABLET | ORAL | 3 refills | Status: DC

## 2019-09-05 NOTE — Progress Notes (Signed)
Paramedicine Encounter    Patient ID: Andrew [name], male    DOB: [birth year], 85 y.o.   MRN: [id number]   Patient Care Team: [name], MD as PCP - General (Family Medicine) Larey Dresser, MD as PCP - Advanced Heart Failure (Cardiology) Larey Dresser, MD as Consulting Physician (Cardiology) Jorge Ny, LCSW as Social Worker (Licensed Clinical Social Worker)  Patient Active Problem List   Diagnosis Date Noted  . Chronic respiratory failure with hypoxia ([place]) 09/16/2018  . Bilateral recurrent inguinal hernias 11/28/2017  . Carotid artery stenosis 01/12/2016  . Chronic systolic CHF (congestive heart failure) ([place]) 12/02/2015  . 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 PVD (peripheral vascular disease) ([place])   . Hereditary and idiopathic peripheral neuropathy 10/02/2015  . Peripelvic (lymphatic) cyst   . Pernicious anemia 07/15/2015  . Bilateral renal cysts 07/12/2015  . Lung nodule, 22mm RML CT 07/12/15 07/12/2015  . Stage 3 chronic kidney disease ([place]) 06/14/2015  . Paroxysmal atrial fibrillation ([place]) 05/28/2015  . AAA (abdominal aortic aneurysm) without rupture ([place]) 09/30/2014  . Cardiomyopathy, ischemic 05/23/2014  . DM (diabetes mellitus), type 2 with renal complications ([place]) 18/29/9371  . Depression, major, single episode, complete remission ([place]) 03/18/2010  . Hypothyroidism 02/03/2009  . Osteoarthritis 05/27/2008  . Hyperlipidemia associated with type 2 diabetes mellitus ([place]) 06/14/2007  . Hypertension associated with diabetes ([place]) 06/14/2007  . GERD 06/14/2007    Current Outpatient Medications:  .  acetaminophen (TYLENOL) 500 MG tablet, Take 1,000 mg by mouth every 6 (six) hours as needed for mild pain or headache., Disp: , Rfl:  .  amiodarone (PACERONE) 200 MG tablet, Take 0.5 tablets (100 mg total) by mouth daily., Disp: 45 tablet, Rfl: 3 .  apixaban (ELIQUIS) 2.5 MG TABS tablet, Take 1 tablet (2.5 mg total) by mouth 2  (two) times daily., Disp: 180 tablet, Rfl: 0 .  atorvastatin (LIPITOR) 80 MG tablet, Take 1 tablet (80 mg total) by mouth daily., Disp: 30 tablet, Rfl: 6 .  carvedilol (COREG) 12.5 MG tablet, Take 1 tablet (12.5 mg total) by mouth 2 (two) times daily., Disp: 180 tablet, Rfl: 3 .  cilostazol (PLETAL) 100 MG tablet, Take 1 tablet (100 mg total) by mouth 2 (two) times daily., Disp: 180 tablet, Rfl: 3 .  ezetimibe (ZETIA) 10 MG tablet, TAKE (1) TABLET DAILY., Disp: 90 tablet, Rfl: 0 .  FLUoxetine (PROZAC) 20 MG capsule, TAKE (1) CAPSULE DAILY. (Patient taking differently: Take 20 mg by mouth daily. ), Disp: 90 capsule, Rfl: 0 .  furosemide (LASIX) 80 MG tablet, TAKE 1&1/2 TABLETS IN THE MORNING AND 1 TABLET IN THE AFTERNOON., Disp: 225 tablet, Rfl: 6 .  gabapentin (NEURONTIN) 100 MG capsule, Take 100 mg by mouth 3 (three) times daily., Disp: , Rfl:  .  hydrALAZINE (APRESOLINE) 50 MG tablet, Take 1 tablet (50 mg total) by mouth 3 (three) times daily., Disp: 270 tablet, Rfl: 1 .  HYDROcodone-acetaminophen (NORCO/VICODIN) 5-325 MG tablet, Take 1 tablet by mouth daily as needed for moderate pain. , Disp: , Rfl:  .  levothyroxine (SYNTHROID, LEVOTHROID) 125 MCG tablet, Take 1 tablet (125 mcg total) by mouth daily before breakfast., Disp: 90 tablet, Rfl: 1 .  Multiple Vitamin (MULTIVITAMIN) tablet, Take 1 tablet by mouth daily., Disp: , Rfl:  .  potassium chloride SA (K-DUR) 20 MEQ tablet, Take 1 tablet (20 mEq total) by mouth daily., Disp: 90 tablet, Rfl: 1 .  SPIRIVA HANDIHALER 18 MCG inhalation  capsule, PLACE 1 CAPSULE INTO INHALER AND INHALE ONCE DAILY. (Patient taking differently: Place 18 mcg into inhaler and inhale daily. ), Disp: 90 capsule, Rfl: 0 .  temazepam (RESTORIL) 30 MG capsule, TAKE 1 CAPSULE AT BEDTIME AS NEEDED FOR SLEEP., Disp: 30 capsule, Rfl: 0 .  glucose blood (ACCU-CHEK AVIVA PLUS) test strip, CHECK BLOOD SUGAR 4 TIMES A DAY., Disp: 100 each, Rfl: 11 .  Influenza Vac A&B Surf Ant Adj  (FLUAD) 0.5 ML SUSY, , Disp: , Rfl:  .  isosorbide mononitrate (IMDUR) 30 MG 24 hr tablet, TAKE 1 TABLET EACH DAY., Disp: 90 tablet, Rfl: 3 .  Lancets MISC, Check blood sugar 4 times a day, Disp: 100 each, Rfl: 11 .  nitroGLYCERIN (NITROSTAT) 0.4 MG SL tablet, DISSOLVE 1 TABLET UNDER TONGUE AS NEEDED FOR CHEST PAIN,MAY REPEAT IN5 MINUTES FOR 2 DOSES. (Patient not taking: Reported on 09/05/2019), Disp: 25 tablet, Rfl: 3 .  traMADol (ULTRAM) 50 MG tablet, Take 1 tablet (50 mg total) by mouth every 6 (six) hours as needed. (Patient not taking: Reported on 08/29/2019), Disp: 12 tablet, Rfl: 0 Allergies  Allergen Reactions  . Metoprolol Shortness Of Breath, Palpitations and Other (See Comments)    Heart starts racing. Shallow breathing; pt states he also get skin irritation on his legs  . Metformin Hives and Swelling    On legs  . Metformin And Related Nausea And Vomiting      Social History   Socioeconomic History  . Marital status: Married    Spouse name: Not on file  . Number of children: Not on file  . Years of education: Not on file  . Highest education level: Not on file  Occupational History  . 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 Financial resource strain: Not on file  . Food insecurity    Worry: Not on file    Inability: Not on file  . Transportation needs    Medical: Not on file    Non-medical: Not on file  Tobacco Use  . Smoking status: Former Smoker    Packs/day: 1.50    Years: 35.00    Pack years: 52.50    Types: Cigarettes  . Smokeless tobacco: Never Used  . Tobacco comment: quit atleast 25 yrs ago, per pt  Substance and Sexual Activity  . Alcohol use: Yes    Alcohol/week: 3.0 standard drinks    Types: 3 Shots of liquor per week    Comment: socially  . Drug use: No  . Sexual activity: Yes  Lifestyle  . Physical activity    Days per week: Not on file    Minutes per session: Not on file  . Stress: Not on file  Relationships  . Social Herbalist on  phone: Not on file    Gets together: Not on file    Attends religious service: Not on file    Active member of club or organization: Not on file    Attends meetings of clubs or organizations: Not on file    Relationship status: Not on file  . Intimate partner violence    Fear of current or ex partner: Not on file    Emotionally abused: Not on file    Physically abused: Not on file    Forced sexual activity: Not on file  Other Topics Concern  . Not on file  Social History Narrative   Pt lives in [place] with spouse.   Retired from [employer].  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    Physical Exam Cardiovascular:     Rate and Rhythm: Normal rate and regular rhythm.     Pulses: Normal pulses.  Pulmonary:     Effort: Pulmonary effort is normal.     Breath sounds: Normal breath sounds.  Musculoskeletal: Normal range of motion.     Right lower leg: No edema.     Left lower leg: No edema.  Skin:    General: Skin is warm and dry.     Capillary Refill: Capillary refill takes less than 2 seconds.  Neurological:     Mental Status: He is alert and oriented to person, place, and time.  Psychiatric:        Mood and Affect: Mood normal.         Future Appointments  Date Time Provider [place]  09/17/2019 10:00 AM [name], MD [facility]  10/30/2019 10:20 AM Larey Dresser, MD [facility] None  10/31/2019  1:20 PM [name], MD [facility]  10/31/2019  1:45 PM [facility] HEALTH COACH [facility]  11/26/2019  2:00 PM Gardiner Barefoot, DPM [facility]    BP (!) 155/91 (BP Location: Left Arm, Patient Position: Sitting, Cuff Size: Normal)   Pulse 80   Resp 16   Wt 191 lb (86.6 kg)   SpO2 91%   BMI 29.04 kg/m   Weight yesterday- Did not weigh  Last visit weight- 192.4 lb  Andrew [name] was seen at home today and reported feeling well. He denied chest pain, headache, dizziness, orthopnea, fever or cough  since our last visit. He stated he has had mild SOB intermittently but was feeling fine today. He reported being compliant with his medications over the past week, having missed only last night's medications. His medications were verified and his pillbox was refilled. All necessary refills were ordered and I will follow up tomorrow to add isosorbide to his box after Mrs Jani picks up his medications from the pharmacy.   [name], EMT 09/05/19  ACTION: Home visit completed Next visit planned for 1 week

## 2019-09-06 ENCOUNTER — Other Ambulatory Visit (HOSPITAL_COMMUNITY): Payer: Self-pay

## 2019-09-06 NOTE — Progress Notes (Signed)
Andrew [name] was seen at home today to finish filling his pillbox. His wife had picked up his medications but was unclear on where to put the missing doses of isosorbide. This was done for them but nothing further was needed. I will follow up next week.

## 2019-09-12 ENCOUNTER — Other Ambulatory Visit (HOSPITAL_COMMUNITY): Payer: Self-pay

## 2019-09-12 ENCOUNTER — Other Ambulatory Visit (HOSPITAL_COMMUNITY): Payer: Self-pay | Admitting: Cardiology

## 2019-09-12 MED ORDER — HYDRALAZINE HCL 50 MG PO TABS: 50.0000 mg | ORAL_TABLET | Freq: Three times a day (TID) | ORAL | 3 refills | Status: DC

## 2019-09-13 NOTE — Progress Notes (Signed)
Paramedicine Encounter    Patient ID: Andrew [name], male    DOB: 1928-10-10, 85 y.o.   MRN: [id number]   Patient Care Team: [name], MD as PCP - General (Family Medicine) Larey Dresser, MD as PCP - Advanced Heart Failure (Cardiology) Larey Dresser, MD as Consulting Physician (Cardiology) Jorge Ny, LCSW as Social Worker (Licensed Clinical Social Worker)  Patient Active Problem List   Diagnosis Date Noted  . Chronic respiratory failure with hypoxia ([place]) 09/16/2018  . Bilateral recurrent inguinal hernias 11/28/2017  . Carotid artery stenosis 01/12/2016  . Chronic systolic CHF (congestive heart failure) ([place]) 12/02/2015  . 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 ([facility]) 10/16/2015  . PVD (peripheral vascular disease) ([place])   . Hereditary and idiopathic peripheral neuropathy 10/02/2015  . Peripelvic (lymphatic) cyst   . Pernicious anemia 07/15/2015  . Bilateral renal cysts 07/12/2015  . Lung nodule, 19mm RML CT 07/12/15 07/12/2015  . Stage 3 chronic kidney disease ([place]) 06/14/2015  . Paroxysmal atrial fibrillation ([place]) 05/28/2015  . AAA (abdominal aortic aneurysm) without rupture ([place]) 09/30/2014  . Cardiomyopathy, ischemic 05/23/2014  . DM (diabetes mellitus), type 2 with renal complications ([place]) 31/54/0086  . Depression, major, single episode, complete remission ([place]) 03/18/2010  . Hypothyroidism 02/03/2009  . Osteoarthritis 05/27/2008  . Hyperlipidemia associated with type 2 diabetes mellitus ([place]) 06/14/2007  . Hypertension associated with diabetes ([place]) 06/14/2007  . GERD 06/14/2007    Current Outpatient Medications:  .  acetaminophen (TYLENOL) 500 MG tablet, Take 1,000 mg by mouth every 6 (six) hours as needed for mild pain or headache., Disp: , Rfl:  .  amiodarone (PACERONE) 200 MG tablet, Take 0.5 tablets (100 mg total) by mouth daily., Disp: 45 tablet, Rfl: 3 .  apixaban (ELIQUIS) 2.5 MG TABS tablet, Take 1 tablet (2.5 mg total) by mouth 2  (two) times daily., Disp: 180 tablet, Rfl: 0 .  atorvastatin (LIPITOR) 80 MG tablet, Take 1 tablet (80 mg total) by mouth daily., Disp: 30 tablet, Rfl: 6 .  carvedilol (COREG) 12.5 MG tablet, Take 1 tablet (12.5 mg total) by mouth 2 (two) times daily., Disp: 180 tablet, Rfl: 3 .  cilostazol (PLETAL) 100 MG tablet, Take 1 tablet (100 mg total) by mouth 2 (two) times daily., Disp: 180 tablet, Rfl: 3 .  ezetimibe (ZETIA) 10 MG tablet, TAKE (1) TABLET DAILY., Disp: 90 tablet, Rfl: 0 .  FLUoxetine (PROZAC) 20 MG capsule, TAKE (1) CAPSULE DAILY., Disp: 90 capsule, Rfl: 0 .  furosemide (LASIX) 80 MG tablet, TAKE 1&1/2 TABLETS IN THE MORNING AND 1 TABLET IN THE AFTERNOON., Disp: 225 tablet, Rfl: 6 .  gabapentin (NEURONTIN) 100 MG capsule, Take 100 mg by mouth 3 (three) times daily., Disp: , Rfl:  .  glucose blood (ACCU-CHEK AVIVA PLUS) test strip, CHECK BLOOD SUGAR 4 TIMES A DAY., Disp: 100 each, Rfl: 11 .  hydrALAZINE (APRESOLINE) 50 MG tablet, Take 1 tablet (50 mg total) by mouth 3 (three) times daily., Disp: 270 tablet, Rfl: 3 .  HYDROcodone-acetaminophen (NORCO/VICODIN) 5-325 MG tablet, Take 1 tablet by mouth daily as needed for moderate pain. , Disp: , Rfl:  .  Influenza Vac A&B Surf Ant Adj (FLUAD) 0.5 ML SUSY, , Disp: , Rfl:  .  isosorbide mononitrate (IMDUR) 30 MG 24 hr tablet, TAKE 1 TABLET EACH DAY., Disp: 90 tablet, Rfl: 3 .  Lancets MISC, Check blood sugar 4 times a day, Disp: 100 each, Rfl: 11 .  levothyroxine (SYNTHROID) 125 MCG  tablet, TAKE 1 TABLET EACH DAY., Disp: 90 tablet, Rfl: 0 .  Multiple Vitamin (MULTIVITAMIN) tablet, Take 1 tablet by mouth daily., Disp: , Rfl:  .  nitroGLYCERIN (NITROSTAT) 0.4 MG SL tablet, DISSOLVE 1 TABLET UNDER TONGUE AS NEEDED FOR CHEST PAIN,MAY REPEAT IN5 MINUTES FOR 2 DOSES. (Patient not taking: Reported on 09/05/2019), Disp: 25 tablet, Rfl: 3 .  potassium chloride SA (K-DUR) 20 MEQ tablet, Take 1 tablet (20 mEq total) by mouth daily., Disp: 90 tablet, Rfl: 1 .   SPIRIVA HANDIHALER 18 MCG inhalation capsule, PLACE 1 CAPSULE INTO INHALER AND INHALE ONCE DAILY. (Patient taking differently: Place 18 mcg into inhaler and inhale daily. ), Disp: 90 capsule, Rfl: 0 .  temazepam (RESTORIL) 30 MG capsule, TAKE 1 CAPSULE AT BEDTIME AS NEEDED FOR SLEEP., Disp: 30 capsule, Rfl: 0 .  traMADol (ULTRAM) 50 MG tablet, Take 1 tablet (50 mg total) by mouth every 6 (six) hours as needed. (Patient not taking: Reported on 08/29/2019), Disp: 12 tablet, Rfl: 0 Allergies  Allergen Reactions  . Metoprolol Shortness Of Breath, Palpitations and Other (See Comments)    Heart starts racing. Shallow breathing; pt states he also get skin irritation on his legs  . Metformin Hives and Swelling    On legs  . Metformin And Related Nausea And Vomiting      Social History   Socioeconomic History  . Marital status: Married    Spouse name: Not on file  . Number of children: Not on file  . Years of education: Not on file  . Highest education level: Not on file  Occupational History  . 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 Financial resource strain: Not on file  . Food insecurity    Worry: Not on file    Inability: Not on file  . Transportation needs    Medical: Not on file    Non-medical: Not on file  Tobacco Use  . Smoking status: Former Smoker    Packs/day: 1.50    Years: 35.00    Pack years: 52.50    Types: Cigarettes  . Smokeless tobacco: Never Used  . Tobacco comment: quit atleast 25 yrs ago, per pt  Substance and Sexual Activity  . Alcohol use: Yes    Alcohol/week: 3.0 standard drinks    Types: 3 Shots of liquor per week    Comment: socially  . Drug use: No  . Sexual activity: Yes  Lifestyle  . Physical activity    Days per week: Not on file    Minutes per session: Not on file  . Stress: Not on file  Relationships  . Social Herbalist on phone: Not on file    Gets together: Not on file    Attends religious service: Not on file    Active member  of club or organization: Not on file    Attends meetings of clubs or organizations: Not on file    Relationship status: Not on file  . Intimate partner violence    Fear of current or ex partner: Not on file    Emotionally abused: Not on file    Physically abused: Not on file    Forced sexual activity: Not on file  Other Topics Concern  . Not on file  Social History Narrative   Pt lives in [place] with spouse.   Retired from [employer].  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    Physical Exam Cardiovascular:  Rate and Rhythm: Normal rate and regular rhythm.     Pulses: Normal pulses.  Pulmonary:     Effort: Pulmonary effort is normal.     Breath sounds: Normal breath sounds.  Abdominal:     General: There is no distension.  Musculoskeletal: Normal range of motion.     Right lower leg: No edema.     Left lower leg: No edema.  Skin:    General: Skin is warm and dry.     Capillary Refill: Capillary refill takes less than 2 seconds.  Neurological:     Mental Status: He is alert and oriented to person, place, and time.  Psychiatric:        Mood and Affect: Mood normal.         Future Appointments  Date Time Provider [place]  09/17/2019 10:00 AM [name], MD [facility]  10/30/2019 10:20 AM Larey Dresser, MD [facility] None  10/31/2019  1:20 PM [name], MD [facility]  10/31/2019  1:45 PM [facility] HEALTH COACH [facility]  11/26/2019  2:00 PM Gardiner Barefoot, DPM [facility]    BP (!) 142/73 (BP Location: Left Arm, Patient Position: Sitting, Cuff Size: Normal)   Pulse 65   Resp 18   Wt 188 lb (85.3 kg)   SpO2 91%   BMI 28.59 kg/m   Weight yesterday- Did not weigh Last visit weight-191 lb  Mr Bushnell was seen at home today and reported feeling well. He denied chest pain, SOB, headache, dizziness, orthopnea, fever or cough since our last visit. He stated he has been compliant with  his medications over the past week and his weight has been stable. His medications were verified and his pillbox was refilled. I will follow up next week.   [name], EMT 09/13/19  ACTION: Home visit completed Next visit planned for 1 week

## 2019-09-16 LAB — HM DIABETES EYE EXAM

## 2019-09-17 ENCOUNTER — Other Ambulatory Visit: Payer: Self-pay

## 2019-09-17 ENCOUNTER — Encounter: Payer: Self-pay | Admitting: Family Medicine

## 2019-09-17 ENCOUNTER — Ambulatory Visit: Payer: Medicare Other | Admitting: Family Medicine

## 2019-09-17 VITALS — BP 95/54 | HR 82 | Temp 98.3°F

## 2019-09-17 DIAGNOSIS — L989 Disorder of the skin and subcutaneous tissue, unspecified: Secondary | ICD-10-CM

## 2019-09-17 DIAGNOSIS — Z23 Encounter for immunization: Secondary | ICD-10-CM | POA: Diagnosis not present

## 2019-09-17 DIAGNOSIS — C44622 Squamous cell carcinoma of skin of right upper limb, including shoulder: Secondary | ICD-10-CM | POA: Diagnosis not present

## 2019-09-17 DIAGNOSIS — R6889 Other general symptoms and signs: Secondary | ICD-10-CM

## 2019-09-17 NOTE — Progress Notes (Signed)
   Chief Complaint:  Andrew [name] is a 85 y.o. male who presents today with a chief complaint of skin lesion.   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  Biopsy performed today.  See below procedure note.  He tolerated well.  Forgetfulness Discussed referral to neurology versus starting Aricept.  They wish to continue monitor for now.  Encouraged daily mental exercises.  Discussed reasons to return to care.  Follow-up as needed.  Flu Vaccine Given today.     Subjective:  HPI:  Skin Lesion Located on right forearm.  Has been there for a couple weeks.  Increasing in size.  Painful.  No treatments tried.  No obvious precipitating events.  No obvious aggravating or alleviating factors.   Forgetfullness Patient had an episode in which he forgot to turn the stove off while making bacon.  His wife is concerned about memory issues.  Patient is not very concerned by this.  Has not had any other episodes of leaving the stove or oven on.  ROS: Per HPI  PMH: He reports that he has quit smoking. His smoking use included cigarettes. He has a 52.50 pack-year smoking history. He has never used smokeless tobacco. He reports current alcohol use of about 3.0 standard drinks of alcohol per week. He reports that he does not use drugs.      Objective:  Physical Exam: BP (!) 95/54   Pulse 82   Temp 98.3 F (36.8 C)   SpO2 (!) 88%   Gen: NAD, resting comfortably MSK: No edema, cyanosis, or clubbing noted Skin: approximately 1.5cm exophytic mass with central umbilication on right forearm. Neuro: Grossly normal, moves all extremities Psych: Normal affect and thought content  Shave Biopsy Procedure Note  Pre-operative Diagnosis: Suspicious lesion  Post-operative Diagnosis: same  Locations: Right Forearm  Indications: Diagnostic  Anesthesia: Lidocaine 1% with epinephrine without added sodium bicarbonate  Procedure  Details  History of allergy to iodine: no  Patient informed of the risks (including bleeding and infection) and benefits of the  procedure and Verbal informed consent obtained.  The lesion and surrounding area were given a sterile prep using betadyne and draped in the usual sterile fashion. A scalpel was used to shave an area of skin approximately 2 cm by 2cm.  Hemostasis achieved with silver nitrate. Antibiotic ointment and a sterile dressing applied.  The specimen was sent for pathologic examination. The patient tolerated the procedure well.  EBL: 5 ml  Findings: N/A  Condition: Stable  Complications: none.  Plan: 1. Instructed to keep the wound dry and covered for 24-48h and clean thereafter. 2. Warning signs of infection were reviewed.   3. Recommended that the patient use OTC analgesics as needed for pain.       Algis Greenhouse. Jerline Pain, MD 09/17/2019 11:11 AM

## 2019-09-17 NOTE — Patient Instructions (Signed)
Skin Biopsy, Care After This sheet gives you information about how to care for yourself after your procedure. Your health care provider may also give you more specific instructions. If you have problems or questions, contact your health care provider. What can I expect after the procedure? After the procedure, it is common to have:  Soreness.  Bruising.  Itching. Follow these instructions at home: Biopsy site care Follow instructions from your health care provider about how to take care of your biopsy site. Make sure you:  Wash your hands with soap and water before and after you change your bandage (dressing). If soap and water are not available, use hand sanitizer.  Apply ointment on your biopsy site as directed by your health care provider.  Change your dressing as told by your health care provider.  Leave stitches (sutures), skin glue, or adhesive strips in place. These skin closures may need to stay in place for 2 weeks or longer. If adhesive strip edges start to loosen and curl up, you may trim the loose edges. Do not remove adhesive strips completely unless your health care provider tells you to do that.  If the biopsy area bleeds, apply gentle pressure for 10 minutes. Check your biopsy site every day for signs of infection. Check for:  Redness, swelling, or pain.  Fluid or blood.  Warmth.  Pus or a bad smell.  General instructions  Rest and then return to your normal activities as told by your health care provider.  Take over-the-counter and prescription medicines only as told by your health care provider.  Keep all follow-up visits as told by your health care provider. This is important. Contact a health care provider if:  You have redness, swelling, or pain around your biopsy site.  You have fluid or blood coming from your biopsy site.  Your biopsy site feels warm to the touch.  You have pus or a bad smell coming from your biopsy site.  You have a fever.   Your sutures, skin glue, or adhesive strips loosen or come off sooner than expected. Get help right away if:  You have bleeding that does not stop with pressure or a dressing. Summary  After the procedure, it is common to have soreness, bruising, and itching at the site.  Follow instructions from your health care provider about how to take care of your biopsy site.  Check your biopsy site every day for signs of infection.  Contact a health care provider if you have redness, swelling, or pain around your biopsy site, or your biopsy site feels warm to the touch.  Keep all follow-up visits as told by your health care provider. This is important. This information is not intended to replace advice given to you by your health care provider. Make sure you discuss any questions you have with your health care provider. Document Released: 01/08/2016 Document Revised: 06/11/2018 Document Reviewed: 06/11/2018 Elsevier Patient Education  2020 Elsevier Inc.  

## 2019-09-17 NOTE — Addendum Note (Signed)
Addended by: Loralyn Freshwater on: 09/17/2019 11:42 AM   Modules accepted: Orders

## 2019-09-19 ENCOUNTER — Other Ambulatory Visit (HOSPITAL_COMMUNITY): Payer: Self-pay

## 2019-09-19 NOTE — Progress Notes (Signed)
Paramedicine Encounter    Patient ID: Andrew [name], male    DOB: 02/28/1928, 85 y.o.   MRN: [id number]   Patient Care Team: [name], MD as PCP - General (Family Medicine) Larey Dresser, MD as PCP - Advanced Heart Failure (Cardiology) Larey Dresser, MD as Consulting Physician (Cardiology) Jorge Ny, LCSW as Social Worker (Licensed Clinical Social Worker)  Patient Active Problem List   Diagnosis Date Noted  . Chronic respiratory failure with hypoxia ([place]) 09/16/2018  . Bilateral recurrent inguinal hernias 11/28/2017  . Carotid artery stenosis 01/12/2016  . Chronic systolic CHF (congestive heart failure) ([place]) 12/02/2015  . 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 PVD (peripheral vascular disease) ([place])   . Hereditary and idiopathic peripheral neuropathy 10/02/2015  . Peripelvic (lymphatic) cyst   . Pernicious anemia 07/15/2015  . Bilateral renal cysts 07/12/2015  . Lung nodule, 51mm RML CT 07/12/15 07/12/2015  . Stage 3 chronic kidney disease ([place]) 06/14/2015  . Paroxysmal atrial fibrillation ([place]) 05/28/2015  . AAA (abdominal aortic aneurysm) without rupture ([place]) 09/30/2014  . Cardiomyopathy, ischemic 05/23/2014  . DM (diabetes mellitus), type 2 with renal complications ([place]) 17/61/6073  . Depression, major, single episode, complete remission ([place]) 03/18/2010  . Hypothyroidism 02/03/2009  . Osteoarthritis 05/27/2008  . Hyperlipidemia associated with type 2 diabetes mellitus ([place]) 06/14/2007  . Hypertension associated with diabetes ([place]) 06/14/2007  . GERD 06/14/2007    Current Outpatient Medications:  .  acetaminophen (TYLENOL) 500 MG tablet, Take 1,000 mg by mouth every 6 (six) hours as needed for mild pain or headache., Disp: , Rfl:  .  amiodarone (PACERONE) 200 MG tablet, Take 0.5 tablets (100 mg total) by mouth daily., Disp: 45 tablet, Rfl: 3 .  apixaban (ELIQUIS) 2.5 MG TABS tablet, Take 1 tablet (2.5 mg total) by mouth 2  (two) times daily., Disp: 180 tablet, Rfl: 0 .  atorvastatin (LIPITOR) 80 MG tablet, Take 1 tablet (80 mg total) by mouth daily., Disp: 30 tablet, Rfl: 6 .  carvedilol (COREG) 12.5 MG tablet, Take 1 tablet (12.5 mg total) by mouth 2 (two) times daily., Disp: 180 tablet, Rfl: 3 .  cilostazol (PLETAL) 100 MG tablet, Take 1 tablet (100 mg total) by mouth 2 (two) times daily., Disp: 180 tablet, Rfl: 3 .  ezetimibe (ZETIA) 10 MG tablet, TAKE (1) TABLET DAILY., Disp: 90 tablet, Rfl: 0 .  FLUoxetine (PROZAC) 20 MG capsule, TAKE (1) CAPSULE DAILY., Disp: 90 capsule, Rfl: 0 .  furosemide (LASIX) 80 MG tablet, TAKE 1&1/2 TABLETS IN THE MORNING AND 1 TABLET IN THE AFTERNOON., Disp: 225 tablet, Rfl: 6 .  gabapentin (NEURONTIN) 100 MG capsule, Take 100 mg by mouth 3 (three) times daily., Disp: , Rfl:  .  glucose blood (ACCU-CHEK AVIVA PLUS) test strip, CHECK BLOOD SUGAR 4 TIMES A DAY., Disp: 100 each, Rfl: 11 .  hydrALAZINE (APRESOLINE) 50 MG tablet, Take 1 tablet (50 mg total) by mouth 3 (three) times daily., Disp: 270 tablet, Rfl: 3 .  HYDROcodone-acetaminophen (NORCO/VICODIN) 5-325 MG tablet, Take 1 tablet by mouth daily as needed for moderate pain. , Disp: , Rfl:  .  isosorbide mononitrate (IMDUR) 30 MG 24 hr tablet, TAKE 1 TABLET EACH DAY., Disp: 90 tablet, Rfl: 3 .  levothyroxine (SYNTHROID) 125 MCG tablet, TAKE 1 TABLET EACH DAY., Disp: 90 tablet, Rfl: 0 .  Multiple Vitamin (MULTIVITAMIN) tablet, Take 1 tablet by mouth daily., Disp: , Rfl:  .  potassium chloride SA (K-DUR)  20 MEQ tablet, Take 1 tablet (20 mEq total) by mouth daily., Disp: 90 tablet, Rfl: 1 .  SPIRIVA HANDIHALER 18 MCG inhalation capsule, PLACE 1 CAPSULE INTO INHALER AND INHALE ONCE DAILY. (Patient taking differently: Place 18 mcg into inhaler and inhale daily. ), Disp: 90 capsule, Rfl: 0 .  temazepam (RESTORIL) 30 MG capsule, TAKE 1 CAPSULE AT BEDTIME AS NEEDED FOR SLEEP., Disp: 30 capsule, Rfl: 0 .  Influenza Vac A&B Surf Ant Adj (FLUAD)  0.5 ML SUSY, , Disp: , Rfl:  .  Lancets MISC, Check blood sugar 4 times a day, Disp: 100 each, Rfl: 11 .  nitroGLYCERIN (NITROSTAT) 0.4 MG SL tablet, DISSOLVE 1 TABLET UNDER TONGUE AS NEEDED FOR CHEST PAIN,MAY REPEAT IN5 MINUTES FOR 2 DOSES. (Patient not taking: Reported on 09/05/2019), Disp: 25 tablet, Rfl: 3 .  traMADol (ULTRAM) 50 MG tablet, Take 1 tablet (50 mg total) by mouth every 6 (six) hours as needed. (Patient not taking: Reported on 08/29/2019), Disp: 12 tablet, Rfl: 0 Allergies  Allergen Reactions  . Metoprolol Shortness Of Breath, Palpitations and Other (See Comments)    Heart starts racing. Shallow breathing; pt states he also get skin irritation on his legs  . Metformin Hives and Swelling    On legs  . Metformin And Related Nausea And Vomiting      Social History   Socioeconomic History  . Marital status: Married    Spouse name: Not on file  . Number of children: Not on file  . Years of education: Not on file  . Highest education level: Not on file  Occupational History  . 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 Financial resource strain: Not on file  . Food insecurity    Worry: Not on file    Inability: Not on file  . Transportation needs    Medical: Not on file    Non-medical: Not on file  Tobacco Use  . Smoking status: Former Smoker    Packs/day: 1.50    Years: 35.00    Pack years: 52.50    Types: Cigarettes  . Smokeless tobacco: Never Used  . Tobacco comment: quit atleast 25 yrs ago, per pt  Substance and Sexual Activity  . Alcohol use: Yes    Alcohol/week: 3.0 standard drinks    Types: 3 Shots of liquor per week    Comment: socially  . Drug use: No  . Sexual activity: Yes  Lifestyle  . Physical activity    Days per week: Not on file    Minutes per session: Not on file  . Stress: Not on file  Relationships  . Social Herbalist on phone: Not on file    Gets together: Not on file    Attends religious service: Not on file    Active member  of club or organization: Not on file    Attends meetings of clubs or organizations: Not on file    Relationship status: Not on file  . Intimate partner violence    Fear of current or ex partner: Not on file    Emotionally abused: Not on file    Physically abused: Not on file    Forced sexual activity: Not on file  Other Topics Concern  . Not on file  Social History Narrative   Pt lives in [place] with spouse.   Retired from [employer].  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    Physical Exam Cardiovascular:  Rate and Rhythm: Normal rate and regular rhythm.     Pulses: Normal pulses.  Pulmonary:     Effort: Pulmonary effort is normal.     Breath sounds: Normal breath sounds.  Musculoskeletal: Normal range of motion.     Right lower leg: Edema present.     Left lower leg: Edema present.  Skin:    General: Skin is warm.     Capillary Refill: Capillary refill takes less than 2 seconds.  Neurological:     Mental Status: He is alert and oriented to person, place, and time.  Psychiatric:        Mood and Affect: Mood normal.         Future Appointments  Date Time Provider [place]  10/30/2019 10:20 AM Larey Dresser, MD [facility] None  10/31/2019  1:20 PM [name], MD [facility]  10/31/2019  1:45 PM [facility] HEALTH COACH [facility]  11/26/2019  2:00 PM Gardiner Barefoot, DPM [facility]    BP 110/60 (BP Location: Left Arm, Patient Position: Sitting, Cuff Size: Normal)   Pulse 74   Resp 16   Wt 188 lb (85.3 kg)   SpO2 94%   BMI 28.59 kg/m   Weight yesterday- Did not weigh  Last visit weight- 188 lb  Mr Kiporpes was seen at home today and reporte feeling well. He denied chest pain, SOB, headache, dizziness, orthopnea, fever or cough since our last visit. He stated he has been compliant with his medications and his weight has been stable. His medications were verified and his pillbox was  refilled. I will follow up next week unless it becomes necessary sooner.   [name], EMT 09/19/19  ACTION: Home visit completed Next visit planned for 1 week

## 2019-09-23 ENCOUNTER — Encounter: Payer: Self-pay | Admitting: Family Medicine

## 2019-09-24 ENCOUNTER — Other Ambulatory Visit (HOSPITAL_COMMUNITY): Payer: Self-pay | Admitting: Cardiology

## 2019-09-26 ENCOUNTER — Other Ambulatory Visit (HOSPITAL_COMMUNITY): Payer: Self-pay | Admitting: *Deleted

## 2019-09-26 ENCOUNTER — Other Ambulatory Visit (HOSPITAL_COMMUNITY): Payer: Self-pay

## 2019-09-26 MED ORDER — CARVEDILOL 12.5 MG PO TABS: 12.5000 mg | ORAL_TABLET | Freq: Two times a day (BID) | ORAL | 0 refills | Status: DC

## 2019-09-26 NOTE — Progress Notes (Signed)
Paramedicine Encounter    Patient ID: Andrew [name], male    DOB: [birth year], 85 y.o.   MRN: [id number]   Patient Care Team: [name], MD as PCP - General (Family Medicine) Larey Dresser, MD as PCP - Advanced Heart Failure (Cardiology) Larey Dresser, MD as Consulting Physician (Cardiology) Jorge Ny, LCSW as Social Worker (Licensed Clinical Social Worker)  Patient Active Problem List   Diagnosis Date Noted  . Chronic respiratory failure with hypoxia ([place]) 09/16/2018  . Bilateral recurrent inguinal hernias 11/28/2017  . Carotid artery stenosis 01/12/2016  . Chronic systolic CHF (congestive heart failure) ([place]) 12/02/2015  . 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 ([place]) 10/16/2015  . PVD (peripheral vascular disease) ([place])   . Hereditary and idiopathic peripheral neuropathy 10/02/2015  . Peripelvic (lymphatic) cyst   . Pernicious anemia 07/15/2015  . Bilateral renal cysts 07/12/2015  . Lung nodule, 86mm RML CT 07/12/15 07/12/2015  . Stage 3 chronic kidney disease 06/14/2015  . Paroxysmal atrial fibrillation ([place]) 05/28/2015  . AAA (abdominal aortic aneurysm) without rupture ([place]) 09/30/2014  . Cardiomyopathy, ischemic 05/23/2014  . DM (diabetes mellitus), type 2 with renal complications ([place]) 06/24/1600  . Depression, major, single episode, complete remission ([place]) 03/18/2010  . Hypothyroidism 02/03/2009  . Osteoarthritis 05/27/2008  . Hyperlipidemia associated with type 2 diabetes mellitus ([place]) 06/14/2007  . Hypertension associated with diabetes ([place]) 06/14/2007  . GERD 06/14/2007    Current Outpatient Medications:  .  acetaminophen (TYLENOL) 500 MG tablet, Take 1,000 mg by mouth every 6 (six) hours as needed for mild pain or headache., Disp: , Rfl:  .  amiodarone (PACERONE) 200 MG tablet, Take 0.5 tablets (100 mg total) by mouth daily., Disp: 45 tablet, Rfl: 3 .  apixaban (ELIQUIS) 2.5 MG TABS tablet, Take 1 tablet (2.5 mg total) by mouth 2 (two)  times daily., Disp: 180 tablet, Rfl: 0 .  atorvastatin (LIPITOR) 80 MG tablet, Take 1 tablet (80 mg total) by mouth daily., Disp: 30 tablet, Rfl: 6 .  cilostazol (PLETAL) 100 MG tablet, Take 1 tablet (100 mg total) by mouth 2 (two) times daily., Disp: 180 tablet, Rfl: 3 .  ezetimibe (ZETIA) 10 MG tablet, TAKE (1) TABLET DAILY., Disp: 90 tablet, Rfl: 0 .  FLUoxetine (PROZAC) 20 MG capsule, TAKE (1) CAPSULE DAILY., Disp: 90 capsule, Rfl: 0 .  furosemide (LASIX) 80 MG tablet, TAKE 1&1/2 TABLETS IN THE MORNING AND 1 TABLET IN THE AFTERNOON., Disp: 225 tablet, Rfl: 6 .  gabapentin (NEURONTIN) 100 MG capsule, Take 100 mg by mouth 3 (three) times daily., Disp: , Rfl:  .  glucose blood (ACCU-CHEK AVIVA PLUS) test strip, CHECK BLOOD SUGAR 4 TIMES A DAY., Disp: 100 each, Rfl: 11 .  hydrALAZINE (APRESOLINE) 50 MG tablet, Take 1 tablet (50 mg total) by mouth 3 (three) times daily., Disp: 270 tablet, Rfl: 3 .  HYDROcodone-acetaminophen (NORCO/VICODIN) 5-325 MG tablet, Take 1 tablet by mouth daily as needed for moderate pain. , Disp: , Rfl:  .  isosorbide mononitrate (IMDUR) 30 MG 24 hr tablet, TAKE 1 TABLET EACH DAY., Disp: 90 tablet, Rfl: 3 .  levothyroxine (SYNTHROID) 125 MCG tablet, TAKE 1 TABLET EACH DAY., Disp: 90 tablet, Rfl: 0 .  Multiple Vitamin (MULTIVITAMIN) tablet, Take 1 tablet by mouth daily., Disp: , Rfl:  .  SPIRIVA HANDIHALER 18 MCG inhalation capsule, PLACE 1 CAPSULE INTO INHALER AND INHALE ONCE DAILY. (Patient taking differently: Place 18 mcg into inhaler and inhale daily. ), Disp: 90  capsule, Rfl: 0 .  temazepam (RESTORIL) 30 MG capsule, TAKE 1 CAPSULE AT BEDTIME AS NEEDED FOR SLEEP., Disp: 30 capsule, Rfl: 0 .  carvedilol (COREG) 12.5 MG tablet, Take 1 tablet (12.5 mg total) by mouth 2 (two) times daily., Disp: 180 tablet, Rfl: 0 .  Influenza Vac A&B Surf Ant Adj (FLUAD) 0.5 ML SUSY, , Disp: , Rfl:  .  Lancets MISC, Check blood sugar 4 times a day, Disp: 100 each, Rfl: 11 .  nitroGLYCERIN  (NITROSTAT) 0.4 MG SL tablet, DISSOLVE 1 TABLET UNDER TONGUE AS NEEDED FOR CHEST PAIN,MAY REPEAT IN5 MINUTES FOR 2 DOSES., Disp: 25 tablet, Rfl: 3 .  potassium chloride SA (K-DUR) 20 MEQ tablet, Take 1 tablet (20 mEq total) by mouth daily., Disp: 90 tablet, Rfl: 1 .  traMADol (ULTRAM) 50 MG tablet, Take 1 tablet (50 mg total) by mouth every 6 (six) hours as needed. (Patient not taking: Reported on 09/26/2019), Disp: 12 tablet, Rfl: 0 Allergies  Allergen Reactions  . Metoprolol Shortness Of Breath, Palpitations and Other (See Comments)    Heart starts racing. Shallow breathing; pt states he also get skin irritation on his legs  . Metformin Hives and Swelling    On legs  . Metformin And Related Nausea And Vomiting      Social History   Socioeconomic History  . Marital status: Married    Spouse name: Not on file  . Number of children: Not on file  . Years of education: Not on file  . Highest education level: Not on file  Occupational History  . 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 Financial resource strain: Not on file  . Food insecurity    Worry: Not on file    Inability: Not on file  . Transportation needs    Medical: Not on file    Non-medical: Not on file  Tobacco Use  . Smoking status: Former Smoker    Packs/day: 1.50    Years: 35.00    Pack years: 52.50    Types: Cigarettes  . Smokeless tobacco: Never Used  . Tobacco comment: quit atleast 25 yrs ago, per pt  Substance and Sexual Activity  . Alcohol use: Yes    Alcohol/week: 3.0 standard drinks    Types: 3 Shots of liquor per week    Comment: socially  . Drug use: No  . Sexual activity: Yes  Lifestyle  . Physical activity    Days per week: Not on file    Minutes per session: Not on file  . Stress: Not on file  Relationships  . Social Herbalist on phone: Not on file    Gets together: Not on file    Attends religious service: Not on file    Active member of club or organization: Not on file     Attends meetings of clubs or organizations: Not on file    Relationship status: Not on file  . Intimate partner violence    Fear of current or ex partner: Not on file    Emotionally abused: Not on file    Physically abused: Not on file    Forced sexual activity: Not on file  Other Topics Concern  . Not on file  Social History Narrative   Pt lives in [place] with spouse.   Retired from [employer].  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    Physical Exam Cardiovascular:     Rate and Rhythm: Normal rate  and regular rhythm.     Pulses: Normal pulses.  Pulmonary:     Effort: Pulmonary effort is normal.     Breath sounds: Normal breath sounds.  Musculoskeletal: Normal range of motion.     Right lower leg: No edema.     Left lower leg: No edema.  Skin:    General: Skin is warm and dry.     Capillary Refill: Capillary refill takes less than 2 seconds.  Neurological:     Mental Status: He is alert and oriented to person, place, and time.  Psychiatric:        Mood and Affect: Mood normal.         Future Appointments  Date Time Provider [place]  10/30/2019 10:20 AM Larey Dresser, MD [facility] None  10/31/2019  1:20 PM [name], MD [facility]  10/31/2019  1:45 PM [facility] HEALTH COACH [facility]  11/26/2019  2:00 PM Gardiner Barefoot, DPM [facility]    BP 118/62 (BP Location: Left Arm, Patient Position: Sitting, Cuff Size: Normal)   Pulse 68   Resp 18   Wt 188 lb (85.3 kg)   SpO2 90%   BMI 28.59 kg/m   Weight yesterday-  Did not weigh Last visit weight- 188 lb  Mr Cavanah was seen at home today and reported feeling well. He denied chest pain, SOB, headache, dizziness, orthopnea, fever or cough since our last visit. He stated he has been compliant with his medications, though he had missed a few pills. His weight remains stable even after a birthday celebration with pound cake. His medications were  verified and his pillbox was refilled. He ran out of carvedilol in his second box so I had a new prescription sent in via the HF clinic and marked the spots in the pillbox which needed to have carvedilol added to it and explained it to Mrs Arno. I advised that I would not be available next week and if they find themselves in need of assistance they should call the HF clinic and one of the other paramedics would be able to assist. Both were understanding and agreeable. I will follow up in two weeks.   [name], EMT 09/26/19  ACTION: Home visit completed Next visit planned for 2 weeks

## 2019-10-10 ENCOUNTER — Other Ambulatory Visit (HOSPITAL_COMMUNITY): Payer: Self-pay | Admitting: *Deleted

## 2019-10-10 ENCOUNTER — Other Ambulatory Visit (HOSPITAL_COMMUNITY): Payer: Self-pay

## 2019-10-10 MED ORDER — APIXABAN 2.5 MG PO TABS: 2.5000 mg | ORAL_TABLET | Freq: Two times a day (BID) | ORAL | 0 refills | Status: DC

## 2019-10-10 NOTE — Progress Notes (Signed)
Paramedicine Encounter    Patient ID: Andrew [name], male    DOB: January 11, 1928, 85 y.o.   MRN: [id number]   Patient Care Team: [name], MD as PCP - General (Family Medicine) Larey Dresser, MD as PCP - Advanced Heart Failure (Cardiology) Larey Dresser, MD as Consulting Physician (Cardiology) Jorge Ny, LCSW as Social Worker (Licensed Clinical Social Worker)  Patient Active Problem List   Diagnosis Date Noted  . Chronic respiratory failure with hypoxia ([place]) 09/16/2018  . Bilateral recurrent inguinal hernias 11/28/2017  . Carotid artery stenosis 01/12/2016  . Chronic systolic CHF (congestive heart failure) ([place]) 12/02/2015  . 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 PVD (peripheral vascular disease) ([place])   . Hereditary and idiopathic peripheral neuropathy 10/02/2015  . Peripelvic (lymphatic) cyst   . Pernicious anemia 07/15/2015  . Bilateral renal cysts 07/12/2015  . Lung nodule, 21mm RML CT 07/12/15 07/12/2015  . Stage 3 chronic kidney disease 06/14/2015  . Paroxysmal atrial fibrillation ([place]) 05/28/2015  . AAA (abdominal aortic aneurysm) without rupture ([place]) 09/30/2014  . Cardiomyopathy, ischemic 05/23/2014  . DM (diabetes mellitus), type 2 with renal complications ([place]) 34/74/2595  . Depression, major, single episode, complete remission ([place]) 03/18/2010  . Hypothyroidism 02/03/2009  . Osteoarthritis 05/27/2008  . Hyperlipidemia associated with type 2 diabetes mellitus ([place]) 06/14/2007  . Hypertension associated with diabetes ([place]) 06/14/2007  . GERD 06/14/2007    Current Outpatient Medications:  .  acetaminophen (TYLENOL) 500 MG tablet, Take 1,000 mg by mouth every 6 (six) hours as needed for mild pain or headache., Disp: , Rfl:  .  amiodarone (PACERONE) 200 MG tablet, Take 0.5 tablets (100 mg total) by mouth daily., Disp: 45 tablet, Rfl: 3 .  apixaban (ELIQUIS) 2.5 MG TABS tablet, Take 1 tablet (2.5 mg total) by mouth 2 (two)  times daily., Disp: 180 tablet, Rfl: 0 .  atorvastatin (LIPITOR) 80 MG tablet, Take 1 tablet (80 mg total) by mouth daily., Disp: 30 tablet, Rfl: 6 .  carvedilol (COREG) 12.5 MG tablet, Take 1 tablet (12.5 mg total) by mouth 2 (two) times daily., Disp: 180 tablet, Rfl: 0 .  cilostazol (PLETAL) 100 MG tablet, Take 1 tablet (100 mg total) by mouth 2 (two) times daily., Disp: 180 tablet, Rfl: 3 .  ezetimibe (ZETIA) 10 MG tablet, TAKE (1) TABLET DAILY., Disp: 90 tablet, Rfl: 0 .  FLUoxetine (PROZAC) 20 MG capsule, TAKE (1) CAPSULE DAILY., Disp: 90 capsule, Rfl: 0 .  furosemide (LASIX) 80 MG tablet, TAKE 1&1/2 TABLETS IN THE MORNING AND 1 TABLET IN THE AFTERNOON., Disp: 225 tablet, Rfl: 6 .  gabapentin (NEURONTIN) 100 MG capsule, Take 100 mg by mouth 3 (three) times daily., Disp: , Rfl:  .  hydrALAZINE (APRESOLINE) 50 MG tablet, Take 1 tablet (50 mg total) by mouth 3 (three) times daily., Disp: 270 tablet, Rfl: 3 .  HYDROcodone-acetaminophen (NORCO/VICODIN) 5-325 MG tablet, Take 1 tablet by mouth daily as needed for moderate pain. , Disp: , Rfl:  .  isosorbide mononitrate (IMDUR) 30 MG 24 hr tablet, TAKE 1 TABLET EACH DAY., Disp: 90 tablet, Rfl: 3 .  levothyroxine (SYNTHROID) 125 MCG tablet, TAKE 1 TABLET EACH DAY., Disp: 90 tablet, Rfl: 0 .  Multiple Vitamin (MULTIVITAMIN) tablet, Take 1 tablet by mouth daily., Disp: , Rfl:  .  potassium chloride SA (K-DUR) 20 MEQ tablet, Take 1 tablet (20 mEq total) by mouth daily., Disp: 90 tablet, Rfl: 1 .  SPIRIVA HANDIHALER 18  MCG inhalation capsule, PLACE 1 CAPSULE INTO INHALER AND INHALE ONCE DAILY. (Patient taking differently: Place 18 mcg into inhaler and inhale daily. ), Disp: 90 capsule, Rfl: 0 .  glucose blood (ACCU-CHEK AVIVA PLUS) test strip, CHECK BLOOD SUGAR 4 TIMES A DAY., Disp: 100 each, Rfl: 11 .  Influenza Vac A&B Surf Ant Adj (FLUAD) 0.5 ML SUSY, , Disp: , Rfl:  .  Lancets MISC, Check blood sugar 4 times a day, Disp: 100 each, Rfl: 11 .   nitroGLYCERIN (NITROSTAT) 0.4 MG SL tablet, DISSOLVE 1 TABLET UNDER TONGUE AS NEEDED FOR CHEST PAIN,MAY REPEAT IN5 MINUTES FOR 2 DOSES. (Patient not taking: Reported on 10/10/2019), Disp: 25 tablet, Rfl: 3 .  temazepam (RESTORIL) 30 MG capsule, TAKE 1 CAPSULE AT BEDTIME AS NEEDED FOR SLEEP., Disp: 30 capsule, Rfl: 0 .  traMADol (ULTRAM) 50 MG tablet, Take 1 tablet (50 mg total) by mouth every 6 (six) hours as needed. (Patient not taking: Reported on 09/26/2019), Disp: 12 tablet, Rfl: 0 Allergies  Allergen Reactions  . Metoprolol Shortness Of Breath, Palpitations and Other (See Comments)    Heart starts racing. Shallow breathing; pt states he also get skin irritation on his legs  . Metformin Hives and Swelling    On legs  . Metformin And Related Nausea And Vomiting      Social History   Socioeconomic History  . Marital status: Married    Spouse name: Not on file  . Number of children: Not on file  . Years of education: Not on file  . Highest education level: Not on file  Occupational History  . 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 Financial resource strain: Not on file  . Food insecurity    Worry: Not on file    Inability: Not on file  . Transportation needs    Medical: Not on file    Non-medical: Not on file  Tobacco Use  . Smoking status: Former Smoker    Packs/day: 1.50    Years: 35.00    Pack years: 52.50    Types: Cigarettes  . Smokeless tobacco: Never Used  . Tobacco comment: quit atleast 25 yrs ago, per pt  Substance and Sexual Activity  . Alcohol use: Yes    Alcohol/week: 3.0 standard drinks    Types: 3 Shots of liquor per week    Comment: socially  . Drug use: No  . Sexual activity: Yes  Lifestyle  . Physical activity    Days per week: Not on file    Minutes per session: Not on file  . Stress: Not on file  Relationships  . Social Herbalist on phone: Not on file    Gets together: Not on file    Attends religious service: Not on file    Active  member of club or organization: Not on file    Attends meetings of clubs or organizations: Not on file    Relationship status: Not on file  . Intimate partner violence    Fear of current or ex partner: Not on file    Emotionally abused: Not on file    Physically abused: Not on file    Forced sexual activity: Not on file  Other Topics Concern  . Not on file  Social History Narrative   Pt lives in [place] with spouse.   Retired from [employer].  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    Physical Exam Cardiovascular:  Rate and Rhythm: Normal rate and regular rhythm.     Pulses: Normal pulses.  Pulmonary:     Effort: Pulmonary effort is normal.     Breath sounds: Normal breath sounds.  Abdominal:     General: There is no distension.  Musculoskeletal: Normal range of motion.     Right lower leg: No edema.     Left lower leg: No edema.  Skin:    General: Skin is warm and dry.     Capillary Refill: Capillary refill takes less than 2 seconds.  Neurological:     Mental Status: He is alert and oriented to person, place, and time.  Psychiatric:        Mood and Affect: Mood normal.         Future Appointments  Date Time Provider [place]  10/30/2019 10:20 AM Larey Dresser, MD [facility] None  10/31/2019  1:20 PM [name], MD [facility]  10/31/2019  1:45 PM [facility] HEALTH COACH [facility]  11/26/2019  2:00 PM Gardiner Barefoot, DPM [facility]    BP 108/70 (BP Location: Left Arm, Patient Position: Sitting, Cuff Size: Normal)   Pulse 76   Resp 16   Wt 188 lb (85.3 kg)   SpO2 93%   BMI 28.59 kg/m   Weight yesterday- did not weigh  Last visit weight- 188 lb  Andrew [name] was seen a home today and reported feeling well. He denied chest pain, SOB, headache, dizziness, orthopnea fever or cough. He stated he has been compliant with his medications over the past two weeks and his weight has been  generally stable. His medications were verified and his pillbox was refilled. I will follow up next week unless it becomes necessary sooner.   [name], EMT 10/10/19  ACTION: Home visit completed Next visit planned for 1 week

## 2019-10-16 ENCOUNTER — Other Ambulatory Visit (HOSPITAL_COMMUNITY): Payer: Self-pay | Admitting: *Deleted

## 2019-10-16 MED ORDER — APIXABAN 2.5 MG PO TABS: 2.5000 mg | ORAL_TABLET | Freq: Two times a day (BID) | ORAL | 3 refills | Status: DC

## 2019-10-17 ENCOUNTER — Other Ambulatory Visit (HOSPITAL_COMMUNITY): Payer: Self-pay

## 2019-10-17 MED ORDER — ATORVASTATIN CALCIUM 80 MG PO TABS: 80.0000 mg | ORAL_TABLET | Freq: Every day | ORAL | 5 refills | Status: DC

## 2019-10-17 NOTE — Progress Notes (Signed)
Paramedicine Encounter    Patient ID: Andrew [name], male    DOB: [birth year], 85 y.o.   MRN: [id number]   Patient Care Team: [name], MD as PCP - General (Family Medicine) Larey Dresser, MD as PCP - Advanced Heart Failure (Cardiology) Larey Dresser, MD as Consulting Physician (Cardiology) Jorge Ny, LCSW as Social Worker (Licensed Clinical Social Worker)  Patient Active Problem List   Diagnosis Date Noted  . Chronic respiratory failure with hypoxia ([place]) 09/16/2018  . Bilateral recurrent inguinal hernias 11/28/2017  . Carotid artery stenosis 01/12/2016  . Chronic systolic CHF (congestive heart failure) ([place]) 12/02/2015  . 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 ([place]) 10/16/2015  . PVD (peripheral vascular disease) ([place])   . Hereditary and idiopathic peripheral neuropathy 10/02/2015  . Peripelvic (lymphatic) cyst   . Pernicious anemia 07/15/2015  . Bilateral renal cysts 07/12/2015  . Lung nodule, 31mm RML CT 07/12/15 07/12/2015  . Stage 3 chronic kidney disease 06/14/2015  . Paroxysmal atrial fibrillation ([place]) 05/28/2015  . AAA (abdominal aortic aneurysm) without rupture ([place]) 09/30/2014  . Cardiomyopathy, ischemic 05/23/2014  . DM (diabetes mellitus), type 2 with renal complications ([place]) 17/61/6073  . Depression, major, single episode, complete remission ([place]) 03/18/2010  . Hypothyroidism 02/03/2009  . Osteoarthritis 05/27/2008  . Hyperlipidemia associated with type 2 diabetes mellitus ([place]) 06/14/2007  . Hypertension associated with diabetes ([place]) 06/14/2007  . GERD 06/14/2007    Current Outpatient Medications:  .  acetaminophen (TYLENOL) 500 MG tablet, Take 1,000 mg by mouth every 6 (six) hours as needed for mild pain or headache., Disp: , Rfl:  .  amiodarone (PACERONE) 200 MG tablet, Take 0.5 tablets (100 mg total) by mouth daily., Disp: 45 tablet, Rfl: 3 .  apixaban (ELIQUIS) 2.5 MG TABS tablet, Take 1 tablet (2.5 mg total) by mouth 2 (two)  times daily., Disp: 180 tablet, Rfl: 3 .  atorvastatin (LIPITOR) 80 MG tablet, Take 1 tablet (80 mg total) by mouth daily., Disp: 30 tablet, Rfl: 6 .  carvedilol (COREG) 12.5 MG tablet, Take 1 tablet (12.5 mg total) by mouth 2 (two) times daily., Disp: 180 tablet, Rfl: 0 .  cilostazol (PLETAL) 100 MG tablet, Take 1 tablet (100 mg total) by mouth 2 (two) times daily., Disp: 180 tablet, Rfl: 3 .  ezetimibe (ZETIA) 10 MG tablet, TAKE (1) TABLET DAILY., Disp: 90 tablet, Rfl: 0 .  FLUoxetine (PROZAC) 20 MG capsule, TAKE (1) CAPSULE DAILY., Disp: 90 capsule, Rfl: 0 .  furosemide (LASIX) 80 MG tablet, TAKE 1&1/2 TABLETS IN THE MORNING AND 1 TABLET IN THE AFTERNOON., Disp: 225 tablet, Rfl: 6 .  gabapentin (NEURONTIN) 100 MG capsule, Take 100 mg by mouth 3 (three) times daily., Disp: , Rfl:  .  hydrALAZINE (APRESOLINE) 50 MG tablet, Take 1 tablet (50 mg total) by mouth 3 (three) times daily., Disp: 270 tablet, Rfl: 3 .  HYDROcodone-acetaminophen (NORCO/VICODIN) 5-325 MG tablet, Take 1 tablet by mouth daily as needed for moderate pain. , Disp: , Rfl:  .  isosorbide mononitrate (IMDUR) 30 MG 24 hr tablet, TAKE 1 TABLET EACH DAY., Disp: 90 tablet, Rfl: 3 .  levothyroxine (SYNTHROID) 125 MCG tablet, TAKE 1 TABLET EACH DAY., Disp: 90 tablet, Rfl: 0 .  Multiple Vitamin (MULTIVITAMIN) tablet, Take 1 tablet by mouth daily., Disp: , Rfl:  .  potassium chloride SA (K-DUR) 20 MEQ tablet, Take 1 tablet (20 mEq total) by mouth daily., Disp: 90 tablet, Rfl: 1 .  SPIRIVA HANDIHALER 18  MCG inhalation capsule, PLACE 1 CAPSULE INTO INHALER AND INHALE ONCE DAILY. (Patient taking differently: Place 18 mcg into inhaler and inhale daily. ), Disp: 90 capsule, Rfl: 0 .  temazepam (RESTORIL) 30 MG capsule, TAKE 1 CAPSULE AT BEDTIME AS NEEDED FOR SLEEP., Disp: 30 capsule, Rfl: 0 .  glucose blood (ACCU-CHEK AVIVA PLUS) test strip, CHECK BLOOD SUGAR 4 TIMES A DAY., Disp: 100 each, Rfl: 11 .  Influenza Vac A&B Surf Ant Adj (FLUAD) 0.5  ML SUSY, , Disp: , Rfl:  .  Lancets MISC, Check blood sugar 4 times a day, Disp: 100 each, Rfl: 11 .  nitroGLYCERIN (NITROSTAT) 0.4 MG SL tablet, DISSOLVE 1 TABLET UNDER TONGUE AS NEEDED FOR CHEST PAIN,MAY REPEAT IN5 MINUTES FOR 2 DOSES. (Patient not taking: Reported on 10/10/2019), Disp: 25 tablet, Rfl: 3 .  traMADol (ULTRAM) 50 MG tablet, Take 1 tablet (50 mg total) by mouth every 6 (six) hours as needed. (Patient not taking: Reported on 09/26/2019), Disp: 12 tablet, Rfl: 0 Allergies  Allergen Reactions  . Metoprolol Shortness Of Breath, Palpitations and Other (See Comments)    Heart starts racing. Shallow breathing; pt states he also get skin irritation on his legs  . Metformin Hives and Swelling    On legs  . Metformin And Related Nausea And Vomiting      Social History   Socioeconomic History  . Marital status: Married    Spouse name: Not on file  . Number of children: Not on file  . Years of education: Not on file  . Highest education level: Not on file  Occupational History  . 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 Financial resource strain: Not on file  . Food insecurity    Worry: Not on file    Inability: Not on file  . Transportation needs    Medical: Not on file    Non-medical: Not on file  Tobacco Use  . Smoking status: Former Smoker    Packs/day: 1.50    Years: 35.00    Pack years: 52.50    Types: Cigarettes  . Smokeless tobacco: Never Used  . Tobacco comment: quit atleast 25 yrs ago, per pt  Substance and Sexual Activity  . Alcohol use: Yes    Alcohol/week: 3.0 standard drinks    Types: 3 Shots of liquor per week    Comment: socially  . Drug use: No  . Sexual activity: Yes  Lifestyle  . Physical activity    Days per week: Not on file    Minutes per session: Not on file  . Stress: Not on file  Relationships  . Social Herbalist on phone: Not on file    Gets together: Not on file    Attends religious service: Not on file    Active member of  club or organization: Not on file    Attends meetings of clubs or organizations: Not on file    Relationship status: Not on file  . Intimate partner violence    Fear of current or ex partner: Not on file    Emotionally abused: Not on file    Physically abused: Not on file    Forced sexual activity: Not on file  Other Topics Concern  . Not on file  Social History Narrative   Pt lives in [place] with spouse.   Retired from [employer].  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    Physical Exam Cardiovascular:  Rate and Rhythm: Normal rate and regular rhythm.     Pulses: Normal pulses.  Pulmonary:     Effort: Pulmonary effort is normal.     Breath sounds: Normal breath sounds.  Musculoskeletal: Normal range of motion.     Right lower leg: No edema.     Left lower leg: No edema.  Skin:    General: Skin is warm and dry.     Capillary Refill: Capillary refill takes less than 2 seconds.  Neurological:     Mental Status: He is alert and oriented to person, place, and time.  Psychiatric:        Mood and Affect: Mood normal.         Future Appointments  Date Time Provider [place]  10/30/2019 10:20 AM Larey Dresser, MD [facility] None  10/31/2019  1:20 PM [name], MD [facility]  10/31/2019  1:45 PM [facility] HEALTH COACH [facility]  11/26/2019  2:00 PM Gardiner Barefoot, DPM [facility]    BP 108/70 (BP Location: Left Arm, Patient Position: Sitting, Cuff Size: Normal)   Pulse 70   Resp 18   Wt 189 lb (85.7 kg)   SpO2 92%   BMI 28.74 kg/m   Weight yesterday- did not weigh  Last visit weight- 188 lb  Mr Dealmeida was seen at home today and reported feeling well. He denied chest pain, SOB, headache, dizziness, orthopnea, fever or cough since our last visit. He stated he has been compliant with his medications over the past week which was evident by the empty pillbox he presented to me to refill. His  weight has been stable though he does not weight daily. His medications were verified and his pillbox was refilled. I will follow up next week.   [name], EMT 10/17/19  ACTION: Home visit completed Next visit planned for 1 week

## 2019-10-17 NOTE — Progress Notes (Signed)
Atorvastatin refilled.  

## 2019-10-22 ENCOUNTER — Telehealth (HOSPITAL_COMMUNITY): Payer: Self-pay

## 2019-10-22 NOTE — Telephone Encounter (Signed)
I called Mr Andrew [name] to schedule an appointment time for Thursday. He stated he was available in the afternoon so we agreed to meet at 13:00.

## 2019-10-24 ENCOUNTER — Other Ambulatory Visit (HOSPITAL_COMMUNITY): Payer: Self-pay

## 2019-10-24 NOTE — Progress Notes (Signed)
Paramedicine Encounter    Patient ID: Andrew [name], male    DOB: 02/06/1928, 85 y.o.   MRN: [id number]   Patient Care Team: [name], MD as PCP - General (Family Medicine) Larey Dresser, MD as PCP - Advanced Heart Failure (Cardiology) Larey Dresser, MD as Consulting Physician (Cardiology) Jorge Ny, LCSW as Social Worker (Licensed Clinical Social Worker)  Patient Active Problem List   Diagnosis Date Noted  . Chronic respiratory failure with hypoxia ([place]) 09/16/2018  . Bilateral recurrent inguinal hernias 11/28/2017  . Carotid artery stenosis 01/12/2016  . Chronic systolic CHF (congestive heart failure) ([place]) 12/02/2015  . 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 PVD (peripheral vascular disease) ([place])   . Hereditary and idiopathic peripheral neuropathy 10/02/2015  . Peripelvic (lymphatic) cyst   . Pernicious anemia 07/15/2015  . Bilateral renal cysts 07/12/2015  . Lung nodule, 61mm RML CT 07/12/15 07/12/2015  . Stage 3 chronic kidney disease 06/14/2015  . Paroxysmal atrial fibrillation ([place]) 05/28/2015  . AAA (abdominal aortic aneurysm) without rupture ([place]) 09/30/2014  . Cardiomyopathy, ischemic 05/23/2014  . DM (diabetes mellitus), type 2 with renal complications ([place]) 87/86/7672  . Depression, major, single episode, complete remission ([place]) 03/18/2010  . Hypothyroidism 02/03/2009  . Osteoarthritis 05/27/2008  . Hyperlipidemia associated with type 2 diabetes mellitus ([place]) 06/14/2007  . Hypertension associated with diabetes ([place]) 06/14/2007  . GERD 06/14/2007    Current Outpatient Medications:  .  acetaminophen (TYLENOL) 500 MG tablet, Take 1,000 mg by mouth every 6 (six) hours as needed for mild pain or headache., Disp: , Rfl:  .  amiodarone (PACERONE) 200 MG tablet, Take 0.5 tablets (100 mg total) by mouth daily., Disp: 45 tablet, Rfl: 3 .  apixaban (ELIQUIS) 2.5 MG TABS tablet, Take 1 tablet (2.5 mg total) by mouth 2 (two)  times daily., Disp: 180 tablet, Rfl: 3 .  atorvastatin (LIPITOR) 80 MG tablet, Take 1 tablet (80 mg total) by mouth daily., Disp: 30 tablet, Rfl: 5 .  carvedilol (COREG) 12.5 MG tablet, Take 1 tablet (12.5 mg total) by mouth 2 (two) times daily., Disp: 180 tablet, Rfl: 0 .  cilostazol (PLETAL) 100 MG tablet, Take 1 tablet (100 mg total) by mouth 2 (two) times daily., Disp: 180 tablet, Rfl: 3 .  ezetimibe (ZETIA) 10 MG tablet, TAKE (1) TABLET DAILY., Disp: 90 tablet, Rfl: 0 .  FLUoxetine (PROZAC) 20 MG capsule, TAKE (1) CAPSULE DAILY., Disp: 90 capsule, Rfl: 0 .  furosemide (LASIX) 80 MG tablet, TAKE 1&1/2 TABLETS IN THE MORNING AND 1 TABLET IN THE AFTERNOON., Disp: 225 tablet, Rfl: 6 .  gabapentin (NEURONTIN) 100 MG capsule, Take 100 mg by mouth 3 (three) times daily., Disp: , Rfl:  .  hydrALAZINE (APRESOLINE) 50 MG tablet, Take 1 tablet (50 mg total) by mouth 3 (three) times daily., Disp: 270 tablet, Rfl: 3 .  isosorbide mononitrate (IMDUR) 30 MG 24 hr tablet, TAKE 1 TABLET EACH DAY., Disp: 90 tablet, Rfl: 3 .  levothyroxine (SYNTHROID) 125 MCG tablet, TAKE 1 TABLET EACH DAY., Disp: 90 tablet, Rfl: 0 .  Multiple Vitamin (MULTIVITAMIN) tablet, Take 1 tablet by mouth daily., Disp: , Rfl:  .  potassium chloride SA (K-DUR) 20 MEQ tablet, Take 1 tablet (20 mEq total) by mouth daily., Disp: 90 tablet, Rfl: 1 .  SPIRIVA HANDIHALER 18 MCG inhalation capsule, PLACE 1 CAPSULE INTO INHALER AND INHALE ONCE DAILY. (Patient taking differently: Place 18 mcg into inhaler and inhale daily. ),  Disp: 90 capsule, Rfl: 0 .  temazepam (RESTORIL) 30 MG capsule, TAKE 1 CAPSULE AT BEDTIME AS NEEDED FOR SLEEP., Disp: 30 capsule, Rfl: 0 .  glucose blood (ACCU-CHEK AVIVA PLUS) test strip, CHECK BLOOD SUGAR 4 TIMES A DAY., Disp: 100 each, Rfl: 11 .  HYDROcodone-acetaminophen (NORCO/VICODIN) 5-325 MG tablet, Take 1 tablet by mouth daily as needed for moderate pain. , Disp: , Rfl:  .  Influenza Vac A&B Surf Ant Adj (FLUAD) 0.5  ML SUSY, , Disp: , Rfl:  .  Lancets MISC, Check blood sugar 4 times a day, Disp: 100 each, Rfl: 11 .  nitroGLYCERIN (NITROSTAT) 0.4 MG SL tablet, DISSOLVE 1 TABLET UNDER TONGUE AS NEEDED FOR CHEST PAIN,MAY REPEAT IN5 MINUTES FOR 2 DOSES. (Patient not taking: Reported on 10/10/2019), Disp: 25 tablet, Rfl: 3 .  traMADol (ULTRAM) 50 MG tablet, Take 1 tablet (50 mg total) by mouth every 6 (six) hours as needed. (Patient not taking: Reported on 09/26/2019), Disp: 12 tablet, Rfl: 0 Allergies  Allergen Reactions  . Metoprolol Shortness Of Breath, Palpitations and Other (See Comments)    Heart starts racing. Shallow breathing; pt states he also get skin irritation on his legs  . Metformin Hives and Swelling    On legs  . Metformin And Related Nausea And Vomiting      Social History   Socioeconomic History  . Marital status: Married    Spouse name: Not on file  . Number of children: Not on file  . Years of education: Not on file  . Highest education level: Not on file  Occupational History  . 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 Financial resource strain: Not on file  . Food insecurity    Worry: Not on file    Inability: Not on file  . Transportation needs    Medical: Not on file    Non-medical: Not on file  Tobacco Use  . Smoking status: Former Smoker    Packs/day: 1.50    Years: 35.00    Pack years: 52.50    Types: Cigarettes  . Smokeless tobacco: Never Used  . Tobacco comment: quit atleast 25 yrs ago, per pt  Substance and Sexual Activity  . Alcohol use: Yes    Alcohol/week: 3.0 standard drinks    Types: 3 Shots of liquor per week    Comment: socially  . Drug use: No  . Sexual activity: Yes  Lifestyle  . Physical activity    Days per week: Not on file    Minutes per session: Not on file  . Stress: Not on file  Relationships  . Social Herbalist on phone: Not on file    Gets together: Not on file    Attends religious service: Not on file    Active member of  club or organization: Not on file    Attends meetings of clubs or organizations: Not on file    Relationship status: Not on file  . Intimate partner violence    Fear of current or ex partner: Not on file    Emotionally abused: Not on file    Physically abused: Not on file    Forced sexual activity: Not on file  Other Topics Concern  . Not on file  Social History Narrative   Pt lives in [place] with spouse.   Retired from [employer].  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    Physical Exam Cardiovascular:  Rate and Rhythm: Normal rate and regular rhythm.     Pulses: Normal pulses.  Pulmonary:     Effort: Pulmonary effort is normal.     Breath sounds: Normal breath sounds.  Musculoskeletal: Normal range of motion.     Right lower leg: No edema.     Left lower leg: No edema.  Skin:    General: Skin is warm and dry.     Capillary Refill: Capillary refill takes less than 2 seconds.  Neurological:     Mental Status: He is alert and oriented to person, place, and time.  Psychiatric:        Mood and Affect: Mood normal.         Future Appointments  Date Time Provider [place]  10/30/2019 10:20 AM Larey Dresser, MD [facility] None  10/31/2019  1:20 PM [name], MD [facility]  10/31/2019  1:45 PM [facility] HEALTH COACH [facility]  11/26/2019  2:00 PM Gardiner Barefoot, DPM [facility]    BP (!) 108/52 (BP Location: Left Arm, Patient Position: Sitting, Cuff Size: Normal)   Pulse 70   Resp 16   Wt 180 lb (81.6 kg)   SpO2 92%   BMI 27.37 kg/m   Weight yesterday- 178 lb Last visit weight- 188 lb  Mr Guedes was seen at home today and reported feeling well. He denied chest pain, SOB, headache, dizziness, orthopnea, fever or cough since our last visit. Per his wife, he has been "manic" lately and he stated he has not been sleeping or eating much over the past several days. He reported being compliant  with his medications and said his weight has dropped significantly. I encouraged him to eat and drink an amount sufficient enough to keep up with his diuretics. He was understanding and agreeable. His medications were verified and his pillbox was refilled. I will follow up next week.   [name], EMT 10/24/19  ACTION: Home visit completed Next visit planned for 1 week

## 2019-10-30 ENCOUNTER — Other Ambulatory Visit (HOSPITAL_COMMUNITY): Payer: Self-pay

## 2019-10-30 ENCOUNTER — Other Ambulatory Visit: Payer: Self-pay

## 2019-10-30 ENCOUNTER — Ambulatory Visit (HOSPITAL_COMMUNITY)
Admission: RE | Admit: 2019-10-30 | Discharge: 2019-10-30 | Disposition: A | Discharge status: Home / Self Care | Payer: Medicare Other | Source: Ambulatory Visit | Attending: Cardiology | Admitting: Cardiology

## 2019-10-30 ENCOUNTER — Encounter (HOSPITAL_COMMUNITY): Payer: Self-pay | Admitting: Cardiology

## 2019-10-30 VITALS — BP 120/49 | HR 85 | Wt 194.2 lb

## 2019-10-30 DIAGNOSIS — E039 Hypothyroidism, unspecified: Secondary | ICD-10-CM | POA: Insufficient documentation

## 2019-10-30 DIAGNOSIS — I4892 Unspecified atrial flutter: Secondary | ICD-10-CM | POA: Diagnosis not present

## 2019-10-30 DIAGNOSIS — Z79899 Other long term (current) drug therapy: Secondary | ICD-10-CM | POA: Insufficient documentation

## 2019-10-30 DIAGNOSIS — I69398 Other sequelae of cerebral infarction: Secondary | ICD-10-CM | POA: Diagnosis not present

## 2019-10-30 DIAGNOSIS — Z87891 Personal history of nicotine dependence: Secondary | ICD-10-CM | POA: Diagnosis not present

## 2019-10-30 DIAGNOSIS — R0683 Snoring: Secondary | ICD-10-CM | POA: Diagnosis not present

## 2019-10-30 DIAGNOSIS — Z7901 Long term (current) use of anticoagulants: Secondary | ICD-10-CM | POA: Insufficient documentation

## 2019-10-30 DIAGNOSIS — Z85820 Personal history of malignant melanoma of skin: Secondary | ICD-10-CM | POA: Insufficient documentation

## 2019-10-30 DIAGNOSIS — R911 Solitary pulmonary nodule: Secondary | ICD-10-CM | POA: Insufficient documentation

## 2019-10-30 DIAGNOSIS — Z800 Family history of malignant neoplasm of digestive organs: Secondary | ICD-10-CM | POA: Diagnosis not present

## 2019-10-30 DIAGNOSIS — Z951 Presence of aortocoronary bypass graft: Secondary | ICD-10-CM | POA: Diagnosis not present

## 2019-10-30 DIAGNOSIS — Z888 Allergy status to other drugs, medicaments and biological substances status: Secondary | ICD-10-CM | POA: Insufficient documentation

## 2019-10-30 DIAGNOSIS — I6523 Occlusion and stenosis of bilateral carotid arteries: Secondary | ICD-10-CM | POA: Insufficient documentation

## 2019-10-30 DIAGNOSIS — K219 Gastro-esophageal reflux disease without esophagitis: Secondary | ICD-10-CM | POA: Insufficient documentation

## 2019-10-30 DIAGNOSIS — R5383 Other fatigue: Secondary | ICD-10-CM

## 2019-10-30 DIAGNOSIS — E1151 Type 2 diabetes mellitus with diabetic peripheral angiopathy without gangrene: Secondary | ICD-10-CM | POA: Diagnosis not present

## 2019-10-30 DIAGNOSIS — E785 Hyperlipidemia, unspecified: Secondary | ICD-10-CM | POA: Insufficient documentation

## 2019-10-30 DIAGNOSIS — Z8249 Family history of ischemic heart disease and other diseases of the circulatory system: Secondary | ICD-10-CM | POA: Insufficient documentation

## 2019-10-30 DIAGNOSIS — I130 Hypertensive heart and chronic kidney disease with heart failure and stage 1 through stage 4 chronic kidney disease, or unspecified chronic kidney disease: Secondary | ICD-10-CM | POA: Insufficient documentation

## 2019-10-30 DIAGNOSIS — I252 Old myocardial infarction: Secondary | ICD-10-CM | POA: Insufficient documentation

## 2019-10-30 DIAGNOSIS — I255 Ischemic cardiomyopathy: Secondary | ICD-10-CM | POA: Diagnosis not present

## 2019-10-30 DIAGNOSIS — R2689 Other abnormalities of gait and mobility: Secondary | ICD-10-CM | POA: Diagnosis not present

## 2019-10-30 DIAGNOSIS — N183 Chronic kidney disease, stage 3 unspecified: Secondary | ICD-10-CM | POA: Insufficient documentation

## 2019-10-30 DIAGNOSIS — I779 Disorder of arteries and arterioles, unspecified: Secondary | ICD-10-CM | POA: Diagnosis not present

## 2019-10-30 DIAGNOSIS — Z87440 Personal history of urinary (tract) infections: Secondary | ICD-10-CM | POA: Insufficient documentation

## 2019-10-30 DIAGNOSIS — J449 Chronic obstructive pulmonary disease, unspecified: Secondary | ICD-10-CM | POA: Diagnosis not present

## 2019-10-30 DIAGNOSIS — I480 Paroxysmal atrial fibrillation: Secondary | ICD-10-CM | POA: Insufficient documentation

## 2019-10-30 DIAGNOSIS — Z79890 Hormone replacement therapy (postmenopausal): Secondary | ICD-10-CM | POA: Insufficient documentation

## 2019-10-30 DIAGNOSIS — I2510 Atherosclerotic heart disease of native coronary artery without angina pectoris: Secondary | ICD-10-CM | POA: Insufficient documentation

## 2019-10-30 DIAGNOSIS — K589 Irritable bowel syndrome without diarrhea: Secondary | ICD-10-CM | POA: Insufficient documentation

## 2019-10-30 DIAGNOSIS — E1122 Type 2 diabetes mellitus with diabetic chronic kidney disease: Secondary | ICD-10-CM | POA: Diagnosis not present

## 2019-10-30 DIAGNOSIS — Z833 Family history of diabetes mellitus: Secondary | ICD-10-CM | POA: Diagnosis not present

## 2019-10-30 DIAGNOSIS — Z87442 Personal history of urinary calculi: Secondary | ICD-10-CM | POA: Insufficient documentation

## 2019-10-30 DIAGNOSIS — I5022 Chronic systolic (congestive) heart failure: Secondary | ICD-10-CM | POA: Diagnosis not present

## 2019-10-30 LAB — COMPREHENSIVE METABOLIC PANEL
ALT: 53 U/L — ABNORMAL HIGH (ref 0–44)
AST: 53 U/L — ABNORMAL HIGH (ref 15–41)
Albumin: 3.4 g/dL — ABNORMAL LOW (ref 3.5–5.0)
Alkaline Phosphatase: 54 U/L (ref 38–126)
Anion gap: 10 (ref 5–15)
BUN: 42 mg/dL — ABNORMAL HIGH (ref 8–23)
CO2: 27 mmol/L (ref 22–32)
Calcium: 9.2 mg/dL (ref 8.9–10.3)
Chloride: 105 mmol/L (ref 98–111)
Creatinine, Ser: 1.84 mg/dL — ABNORMAL HIGH (ref 0.61–1.24)
GFR calc Af Amer: 37 mL/min — ABNORMAL LOW (ref >60)
GFR calc non Af Amer: 32 mL/min — ABNORMAL LOW (ref >60)
Glucose, Bld: 203 mg/dL — ABNORMAL HIGH (ref 70–99)
Potassium: 3.8 mmol/L (ref 3.5–5.1)
Sodium: 142 mmol/L (ref 135–145)
Total Bilirubin: 0.7 mg/dL (ref 0.3–1.2)
Total Protein: 6.3 g/dL — ABNORMAL LOW (ref 6.5–8.1)

## 2019-10-30 LAB — CBC
HCT: 36.9 % — ABNORMAL LOW (ref 39.0–52.0)
Hemoglobin: 11.9 g/dL — ABNORMAL LOW (ref 13.0–17.0)
MCH: 32 pg (ref 26.0–34.0)
MCHC: 32.2 g/dL (ref 30.0–36.0)
MCV: 99.2 fL (ref 80.0–100.0)
Platelets: 151 10*3/uL (ref 150–400)
RBC: 3.72 MIL/uL — ABNORMAL LOW (ref 4.22–5.81)
RDW: 12.5 % (ref 11.5–15.5)
WBC: 6.6 10*3/uL (ref 4.0–10.5)
nRBC: 0 % (ref 0.0–0.2)

## 2019-10-30 LAB — TSH: TSH: 0.32 u[IU]/mL — ABNORMAL LOW (ref 0.350–4.500)

## 2019-10-30 NOTE — Progress Notes (Signed)
Paramedicine Encounter   Patient ID: Andrew [name] , male,   DOB: 1928-01-25,85 y.o.,  MRN: [id number]  Mr Hammitt was seen in the HF clinic today with Dr [name] and reported feeling well. Per Dr [name], no medication changes were needed at this time. He has ordered a home sleep study for which the hardware should arrive at their house sometime next week. I will assist him with the set up when it arrives. I will see him tomorrow to fill his pillbox.   [name], EMT 10/30/2019   ACTION: Next visit planned for tomorrow to fill his pillbox.

## 2019-10-30 NOTE — Progress Notes (Signed)
Date:  10/30/2019   ID:  Andrew [name], DOB 1928/11/12, MRN [id number]  Provider location: [facility] Type of Visit: Established patient   PCP:  [name], MD  Cardiologist:  Dr. [name]  Chief Complaint: Shortness of breath   History of Present Illness: Andrew [name] is a 85 y.o. male who has a history of CKD, CAD s/p CABG, and ischemic cardiomyopathy with primarily diastolic CHF.  He has PAD followed at [facility].  I took him for [facility] in 11/15. 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  He had had occlusion of a relatively small LAD, there were right to left collaterals and no intervention was done (medical management).    He and his wife went on a cruise along the [employer] in 5/16.  He admits to considerable dietary indiscretion (high sodium diet). It appears that he developed a hypertensive crisis along with chest pain while on the ship. He went into atrial fibrillation and developed CHF.  He was taken to the hospital in [place], [place].  He was in the ICU for about a week on Bipap intermittently.  Troponin peaked at 0.09 during this admission.  He was diuresed and after about 2 wks left the hospital and was able to fly home.    Cardiolite (6/16) showed EF 42%, prior anterolateral MI, no ischemia. Echo in 10/16 with EF 40-45%.    He was admitted on 07/06/15 for RLQ pain and fever.  He was treated for UTI and had a kidney stone as well.  He developed SOB after IVF were given for AKI.  He was treated for acute COPD exacerbation as well as CHF decompensation.  He was diuresed with IV lasix.  His weight on admission was 179 and got as high as 188 after IVF. Discharge weight was 181.  He was admitted in 10/16 with a cerebellar CVA.  He has some resultant imbalance.   He was again admitted in 11/16 with acute on chronic systolic CHF.  This was a short admission that appeared to be precipitated by a sodium load from eating at a [place] [employer] .    3/18 Echo showed stable EF 40-45%.  He was also having chest pain so I did a Cardiolite in 3/18, no ischemia.   He was admitted in 11/18 for elective hernia repair. Post-op, he developed hypoxemia and had a rehab stay.  He is now off oxygen.   Creatinine increased to 2.97 in 1/19.  I cut back on losartan and Lasix.  He developed increased lower extremity edema and we increased Lasix back to 120 qam/80 qpm.  He had a mechanical fall in 7/20 (tripped). He hit his head but refused to go to the ER.  Later in 7/20, he was admitted with fever and started on antibiotics with concern for PNA, but CT chest did not show definite PNA.  ?Viral syndrome.  COVID-19 was negative.   Echo in 8/20 showed  EF 45-50% with anterolateral hypokinesis, normal RV.   He returns for followup of CHF and CAD.  He seems symptomatically stable.  Weight is up 2 lbs. Creatinine has been stable recently.  No dyspnea walking around in his house or short distances such as into the office today.  He has been sleeping in his recliner for years because of back pain.  He denies orthopnea/PND.  No chest pain. Minimal leg pain (claudication).  He is sleepy in the daytime and snores  at night per his wife.  ECG (personally reviewed): NSR, 1st degree AVB, IVCD (140 msec).   Labs (12/14): K 4.8, creatinine 1.5 Labs (5/15): LDL 92 Labs (11/15): K 4.1, creatinine 1.5 Labs (12/15): K 4, creatinine 1.5 Labs (1/16): LFTs normal, LDL 51, HDL 47 Labs (5/16): TnI 0.09 Labs (6/16): K 4.8 => 4.4, creatinine 1.79 => 2.26, HCT 28.7 Labs (7/16) K 4.2, creatinine 1.98, LFTs normal, TSH normal Labs (8/16): HCT 27.1 Labs (9/16): hgb 8.1, K 4.3, creatinine 1.8, LFTs normal, TSH elevated Labs (10/16): LDL 74, HDL 45, TSH 5.19 (increased), free T3 and free T4 normal.  Labs (11/16): K 3.5, creatinine 1.47, AST 84, ALT 89 Labs (12/16): K 4.3, creatinine 1.58, AST 43, ALT 42 Labs (3/17): K 4.2, creatinine 1.4, hgb 10.7, AST 36, ALT 45 Labs (6/17): K  4, creatinine 1.69, hgb 10.9, proBNP 389, TSH mildly elevated Labs (8/17): Free T4 and T3 normal, TSH mildly elevated, K 4.2, creatinine 1.7, LFTs normal, hgb 10.4 Labs (12/17): TSH elevated but free T3 and T4 normal, K 4.5, creatinine 1.9, LDL 45, HDL 39, LFTs normal, HCT 32.9 Labs (1/18): K 4.4, creatinine 1.93 Labs (3/18): K 4.3, creatinine 1.87, BNP 187, hgb 11.1, LDL 75, LFTs normal Labs (4/18): K 4.1, creatinine 1.7 Labs (9/18): K 4, creatinine 1.8, hgb 11.9 Labs (10/18): free T4 0.4, free T3 1.9, TSH 139  Labs (12/18): K 3.4, creatinine 1.76, hgb 12.2 Labs (1/19): K 4 => 4.9, creatinine 2.7 => 2.97, TSH 179, LFTs normal Labs (2/19): K 4.6, creatinine 2.19 Labs (4/19): LDL 61, HDL 36, K 3.9, creatinine 1.77, LFTs normal  Labs (6/19): hgb 11.7, K 3.8, creatinine 2.09, TSH 10 Labs (9/19): K 3.9, creatinine 1.69 Labs (12/19): K 3.5, creatinine 1.69, LDL 75, HDL 39, TSH normal Labs (7/20): K 3.8, creatinine 1.69 Labs (8/20): K 4, creatinine 1.63, hgb 12.1  PMH: 1. CKD stage 3 2. HTN 3. Hyperlipidemia 4. AAA: s/p surgery with left renal artery bypass in 1997 - Abdominal US 10/18 with 3.2 AAA 5. GERD 6. IBS 7. Melanoma s/p reception 8. Diabetes: Diet-controlled 9. Carotid stenosis: Carotid dopplers (4/15) with 40-59% bilateral stenosis. Carotid dopplers (7/16) with < 40% BICA stenosis.  Carotid dopplers (10/16) with 40-59% BICA stenosis.  - Carotids (8/17) with minimal disease.  - Carotids (10/18) with 1-39% BICA stenosis.  - Carotids (1/19): 1-39% BICA stenosis 10. PAD: 9/14 ABIs 0.91 right 1.09 left.  7/16 ABIs 0.86 right, 1.1 left; aortoiliac duplex with < 50% bilateral iliac stenosis.  - Aortoiliac duplex (8/17) with < 50% bilateral iliac stenosis.   - ABIs (10/17): left 0.91, right 0.98, left TBI 0.44 (abnormal).  - ABIs (7/18): right 0.97, right 0.75 - ABIs (1/19): right 1.04, left 1.03 - ABIs (7/19): right 1.13, noncompressible left ABI but TBI 0.93 11. CAD: Has  super-dominant RCA. s/p CABG 1992.  He had Taxus DES to mid PDA in 5/02. LHC (1/05) with 90% ostial PDA (had DES to this vessel), totally occluded RIMA-PDA, totally occluded PLV, sequential SVG-PLV with 1 branch occluded.  LHC (11/15) with left main off the right cusp, 40-50% ostial left main, total occlusion of the proximal LAD (small vessel) with right to left collaterals, large super-dominant RCA with 50% ISR mid RCA, 50% ostial PDA, patent SVG-PLV, RIMA-PDA known atretic. Lexiscan Cardiolite (6/16) with EF 42%, prior anterolateral MI, no ischemia.  - Lexiscan Cardiolite (3/18): EF 51%, basal to mid anterolateral fixed defect with no ischemia.  12. Ischemic cardiomyopathy: Echo (12/09) with EF  45-50%, basal to mid inferolateral hypokinesis. Echo (6/15) with EF 45-50%, akinesis of the mid to apical inferolateral wall.  Echo (6/16) with EF 50-55%, mild LVH, inferior hypokinesis, mild MR.  Echo (10/16) with EF 40-45%, moderate LVH, mildly decreased RV systolic function.  - Echo (3/18) with EF 40-45%, mid anterior hypokinesis.  - Echo (2/19) with EF 60-65%, mild LVH, trade II diastolic dysfunction.  - Echo (8/20): EF 45-50%, anterolateral hypokinesis, normal RV size and systolic function.  13. OA 14. Atrial fibrillation/flutter: Paroxysmal.  Noted during 5/16 hospitalization in [place] and in 6/16.  He developed atrial flutter in 8/16, back in NSR by 9/16.  15. Emphysema: PFTs (8/16) with FVC 88%, FEV1 74%, ratio 81%, TLC 83%, DLCO 31%. - PFTs (12/18): FVC 64%, FEV1 69%, ratio 105%, TLC 82%, DLCO 33% => mild obstruction, severely decreased DLCO (similar to prior).   16. Renal cysts 17. Idiopathic peripheral neuropathy.  18. Anemia.  65. 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  20. Hernia repair (11/18) 21. Hypothyroidism 22. Bronchiectasis: CT chest 1/19 with bronchiectasis.  23. Lung nodule: 1.3 cm RML nodule on 1/19 CT, was noted in 2014 => slow growing. Possibly malignant.  Decision made with pulmonary to watch  and avoid biopsy.  24. Suspect chronic aspiration with bronchiectasis.    Current Outpatient Medications  Medication Sig Dispense Refill  . acetaminophen (TYLENOL) 500 MG tablet Take 1,000 mg by mouth every 6 (six) hours as needed for mild pain or headache.    Marland Kitchen amiodarone (PACERONE) 200 MG tablet Take 0.5 tablets (100 mg total) by mouth daily. 45 tablet 3  . apixaban (ELIQUIS) 2.5 MG TABS tablet Take 1 tablet (2.5 mg total) by mouth 2 (two) times daily. 180 tablet 3  . atorvastatin (LIPITOR) 80 MG tablet Take 1 tablet (80 mg total) by mouth daily. 30 tablet 5  . carvedilol (COREG) 12.5 MG tablet Take 1 tablet (12.5 mg total) by mouth 2 (two) times daily. 180 tablet 0  . cilostazol (PLETAL) 100 MG tablet Take 1 tablet (100 mg total) by mouth 2 (two) times daily. 180 tablet 3  . ezetimibe (ZETIA) 10 MG tablet TAKE (1) TABLET DAILY. 90 tablet 0  . FLUoxetine (PROZAC) 20 MG capsule TAKE (1) CAPSULE DAILY. 90 capsule 0  . furosemide (LASIX) 80 MG tablet TAKE 1&1/2 TABLETS IN THE MORNING AND 1 TABLET IN THE AFTERNOON. 225 tablet 6  . gabapentin (NEURONTIN) 100 MG capsule Take 100 mg by mouth 3 (three) times daily.    Marland Kitchen glucose blood (ACCU-CHEK AVIVA PLUS) test strip CHECK BLOOD SUGAR 4 TIMES A DAY. 100 each 11  . hydrALAZINE (APRESOLINE) 50 MG tablet Take 1 tablet (50 mg total) by mouth 3 (three) times daily. 270 tablet 3  . HYDROcodone-acetaminophen (NORCO/VICODIN) 5-325 MG tablet Take 1 tablet by mouth daily as needed for moderate pain.     . Influenza Vac A&B Surf Ant Adj (FLUAD) 0.5 ML SUSY     . isosorbide mononitrate (IMDUR) 30 MG 24 hr tablet TAKE 1 TABLET EACH DAY. 90 tablet 3  . Lancets MISC Check blood sugar 4 times a day 100 each 11  . levothyroxine (SYNTHROID) 125 MCG tablet TAKE 1 TABLET EACH DAY. 90 tablet 0  . Multiple Vitamin (MULTIVITAMIN) tablet Take 1 tablet by mouth daily.    . nitroGLYCERIN (NITROSTAT) 0.4 MG SL tablet DISSOLVE 1 TABLET UNDER TONGUE AS NEEDED FOR CHEST  PAIN,MAY REPEAT IN5 MINUTES FOR 2 DOSES. 25 tablet 3  . oxyCODONE (OXY IR/ROXICODONE) 5 MG  immediate release tablet oxycodone 5 mg tablet    . potassium chloride SA (K-DUR) 20 MEQ tablet Take 1 tablet (20 mEq total) by mouth daily. 90 tablet 1  . SPIRIVA HANDIHALER 18 MCG inhalation capsule PLACE 1 CAPSULE INTO INHALER AND INHALE ONCE DAILY. (Patient taking differently: Place 18 mcg into inhaler and inhale daily. ) 90 capsule 0  . temazepam (RESTORIL) 30 MG capsule TAKE 1 CAPSULE AT BEDTIME AS NEEDED FOR SLEEP. 30 capsule 0  . traMADol (ULTRAM) 50 MG tablet Take 1 tablet (50 mg total) by mouth every 6 (six) hours as needed. 12 tablet 0   No current facility-administered medications for this encounter.     Allergies:   Metoprolol, Metformin, and Metformin and related   Social History:  The patient  reports that he has quit smoking. His smoking use included cigarettes. He has a 52.50 pack-year smoking history. He has never used smokeless tobacco. He reports current alcohol use of about 3.0 standard drinks of alcohol per week. He reports that he does not use drugs.   Family History:  The patient's family history includes Colon cancer in his father; Coronary artery disease in his brother; Diabetes in his brother and son; Heart disease in his brother; Hyperlipidemia in his brother and son; Peripheral vascular disease in his brother; Throat cancer in his brother.   ROS:  Please see the history of present illness.   All other systems are personally reviewed and negative.   Exam:   BP (!) 120/49   Pulse 85   Wt 88.1 kg (194 lb 3.2 oz)   SpO2 94%   BMI 29.53 kg/m  General: NAD Neck: No JVD, no thyromegaly or thyroid nodule.  Lungs: Clear to auscultation bilaterally with normal respiratory effort. CV: Nondisplaced PMI.  Heart regular S1/S2, no S3/S4, 2/6 SEM RUSB.  Trace ankle edema.  No carotid bruit.  Unable to palpate pedal pulses.  Abdomen: Soft, nontender, no hepatosplenomegaly, no  distention.  Skin: Intact without lesions or rashes.  Neurologic: Alert and oriented x 3.  Psych: Normal affect. Extremities: No clubbing or cyanosis.  HEENT: Normal.    Recent Labs: 07/02/2019: B Natriuretic Peptide 393.5 10/30/2019: ALT 53; BUN 42; Creatinine, Ser 1.84; Hemoglobin 11.9; Platelets 151; Potassium 3.8; Sodium 142; TSH 0.320  Personally reviewed   Wt Readings from Last 3 Encounters:  10/30/19 88.1 kg (194 lb 3.2 oz)  10/24/19 81.6 kg (180 lb)  10/17/19 85.7 kg (189 lb)      ASSESSMENT AND PLAN:  1. CAD: s/p CABG.  Cath in 11/15 showed occluded LAD with left to right collaterals.  The LAD was a relatively small vessel (super-dominant right).  Managed medically. Lexiscan Cardiolite in 6/16 with infarction but no ischemia.  Cardiolite 3/18 with infarction, no ischemia. No chest pain.  - Continue atorvastatin, he is not on ASA given use of Eliquis.  2. Chronic primarily diastolic CHF/ischemic cardiomyopathy: EF 40-45% on 3/18 echo.  Echo 8/20 with EF 45-50%, normal RV.  NYHA II. He does not appear significantly volume overloaded on exam.  - Continue Lasix 120 qam/80 qpm with KCl.  BMET today.   - Continue hydralazine 50 mg tid.    - Continue Imdur 30 mg daily.    - Continue Coreg 12.5 mg bid.  - Not on ACEi/ARB with elevated creatinine.    3. Carotid stenosis: Stable. Followed at [facility].  4. PAD: Improved claudication, ABIs had normalized when last checked.  No pedal ulcers.   - Follows at  [facility]. Plan for medical management.  - Continue cilostazol, unable to come off due to worsening claudication when he stops it.  5. Hyperlipidemia: Continue atorvastatin, good lipids in 12/19.   6. CKD III:  BMET today.  7. Atrial fibrillation/flutter:  Paroxysmal.  NSR today.  - Continue amiodarone 100 mg daily. Will need to get regular eye exams with amiodarone use.  High resolution CT in 1/19 did not appear to show evidence for amiodarone lung toxicity. Draw LFTs, TSH followed by PCP  (hypothyroidism).  - Continue Eliquis 2.5 bid.  Check CBC.  8. Suspected OSA:  I will arrange for home sleep study given snoring and daytime sleepiness.   9. COPD: He is now off oxygen. He has bronchiectasis thought to be due to chronic aspiration. - Follows with pulmonary.  10. CVA: Cerebellar, 10/16.  He has had some resulting imbalance.  - He is no longer driving.  11. Hypertension: BP has been controlled when he takes his medications.  12. Hypothyroidism: Likely related to amiodarone. - Followed by PCP.  13. Pulmonary nodule: 1.3 cm RML nodule on 1/19 CT chest.  It was also seen in 2014 => slow growing.  Dr Lake Bells has discussed with wife and they have decided not to go forward with biopsy. They would not be interested in lung cancer treatment.  CT chest in 7/20 showed stable 1 cm RML nodule.    Signed, Loralie Champagne, MD  10/30/2019  [facility] [address] [facility] [place] [address] [phone] (office) [phone] (fax)

## 2019-10-30 NOTE — Patient Instructions (Addendum)
No medication changes  Your provider has recommended that you have a home sleep study.  [employer] is the company that does these test.  They will contact you by phone and must speak with you before they can ship the equipment.  Once they have spoken with you they will send the equipment right to your home with instructions on how to set it up.  Once you have completed the test simply box all the equipment back up and mail back to the company.  IF you have any questions or issues with the equipment please call the company directly at [phone].  If your test is positive for sleep apnea and you need a home CPAP machine you will be contacted by Dr Theodosia Blender office [facility]) to set this up.  Labs today We will only contact you if something comes back abnormal or we need to make some changes. Otherwise no news is good news!  Your physician recommends that you schedule a follow-up appointment in: 3 months with Dr [name].  At the [facility], you and your health needs are our priority. As part of our continuing mission to provide you with exceptional heart care, we have created designated Provider Care Teams. These Care Teams include your primary Cardiologist (physician) and Advanced Practice Providers (APPs- Physician Assistants and Nurse Practitioners) who all work together to provide you with the care you need, when you need it.   You may see any of the following providers on your designated Care Team at your next follow up: Marland Kitchen Dr Glori Bickers . Dr Loralie Champagne . Darrick Grinder, NP . Lyda Jester, PA   Please be sure to bring in all your medications bottles to every appointment.

## 2019-10-30 NOTE — Progress Notes (Addendum)
Patient Name: Andrew [name]         DOB: 10-13-2028      Height: 5'8    Weight: 194 lb  Office Name: [facility]         Referring Provider: Loralie Champagne  Today's Date: 10/30/19  Date:   STOP BANG RISK ASSESSMENT S (snore) Have you been told that you snore?     NO   T (tired) Are you often tired, fatigued, or sleepy during the day?   YES  O (obstruction) Do you stop breathing, choke, or gasp during sleep? NO   P (pressure) Do you have or are you being treated for high blood pressure? NO   B (BMI) Is your body index greater than 35 kg/m? NO   A (age) Are you 83 years old or older? YES   N (neck) Do you have a neck circumference greater than 16 inches?   NA   G (gender) Are you a male? YES   TOTAL STOP/BANG "YES" ANSWERS 3                                                                       For Office Use Only              Procedure Order Form    YES to 3+ Stop Bang questions OR two clinical symptoms - patient qualifies for [employer]PAT (CPT 95800)     Submit: This Form + Patient Face Sheet + Clinical Note via [facility] or Fax: [phone]         Clinical Notes: Will consult Sleep Specialist and refer for management of therapy due to patient increased risk of Sleep Apnea. Ordering a sleep study due to the following two clinical symptoms: Excessive daytime sleepiness G47.10 / Personality changes or irritability R45.4   I understand that I am proceeding with a home sleep apnea test as ordered by my treating physician. I understand that untreated sleep apnea is a serious cardiovascular risk factor and it is my responsibility to perform the test and seek management for sleep apnea. I will be contacted with the results and be managed for sleep apnea by a local sleep physician. I will be receiving equipment and further instructions from [facility]. I shall promptly ship back the equipment via the included mailing label. I understand my insurance will be billed for the test  and as the patient I am responsible for any insurance related out-of-pocket costs incurred. I have been provided with written instructions and can call for additional video or telephonic instruction, with 24-hour availability of qualified personnel to answer any questions: Patient Help Desk [phone].  Patient Signature ______________________________________________________   Date______________________ Patient Telemedicine Verbal Consent

## 2019-10-31 ENCOUNTER — Ambulatory Visit (INDEPENDENT_AMBULATORY_CARE_PROVIDER_SITE_OTHER): Payer: Medicare Other

## 2019-10-31 ENCOUNTER — Telehealth: Payer: Self-pay

## 2019-10-31 ENCOUNTER — Other Ambulatory Visit (HOSPITAL_COMMUNITY): Payer: Self-pay

## 2019-10-31 ENCOUNTER — Encounter: Payer: Self-pay | Admitting: Family Medicine

## 2019-10-31 ENCOUNTER — Telehealth (HOSPITAL_COMMUNITY): Payer: Self-pay

## 2019-10-31 ENCOUNTER — Ambulatory Visit: Payer: Medicare Other | Admitting: Family Medicine

## 2019-10-31 VITALS — BP 112/58 | HR 66 | Temp 98.1°F | Ht 68.0 in | Wt 192.2 lb

## 2019-10-31 VITALS — BP 112/58 | Temp 98.1°F | Ht 68.0 in | Wt 192.2 lb

## 2019-10-31 DIAGNOSIS — C44622 Squamous cell carcinoma of skin of right upper limb, including shoulder: Secondary | ICD-10-CM

## 2019-10-31 DIAGNOSIS — L570 Actinic keratosis: Secondary | ICD-10-CM | POA: Diagnosis not present

## 2019-10-31 DIAGNOSIS — Z0001 Encounter for general adult medical examination with abnormal findings: Secondary | ICD-10-CM

## 2019-10-31 DIAGNOSIS — E1122 Type 2 diabetes mellitus with diabetic chronic kidney disease: Secondary | ICD-10-CM

## 2019-10-31 DIAGNOSIS — E039 Hypothyroidism, unspecified: Secondary | ICD-10-CM

## 2019-10-31 DIAGNOSIS — I255 Ischemic cardiomyopathy: Secondary | ICD-10-CM

## 2019-10-31 DIAGNOSIS — E785 Hyperlipidemia, unspecified: Secondary | ICD-10-CM

## 2019-10-31 DIAGNOSIS — E1159 Type 2 diabetes mellitus with other circulatory complications: Secondary | ICD-10-CM

## 2019-10-31 DIAGNOSIS — N183 Chronic kidney disease, stage 3 unspecified: Secondary | ICD-10-CM

## 2019-10-31 DIAGNOSIS — F325 Major depressive disorder, single episode, in full remission: Secondary | ICD-10-CM

## 2019-10-31 DIAGNOSIS — Z0000 Encounter for general adult medical examination without abnormal findings: Secondary | ICD-10-CM | POA: Diagnosis not present

## 2019-10-31 DIAGNOSIS — E1169 Type 2 diabetes mellitus with other specified complication: Secondary | ICD-10-CM

## 2019-10-31 NOTE — Assessment & Plan Note (Signed)
Stable.  Continue atorvastatin 80mg daily

## 2019-10-31 NOTE — Patient Instructions (Signed)
Andrew [name] , Thank you for taking time to come for your Medicare Wellness Visit. I appreciate your ongoing commitment to your health goals. Please review the following plan we discussed and let me know if I can assist you in the future.   Screening recommendations/referrals: Colorectal Screening: up to date; last colonoscopy 04/27/12  Vision and Dental Exams: Recommended annual ophthalmology exams for early detection of glaucoma and other disorders of the eye Recommended annual dental exams for proper oral hygiene  Diabetic Exams: Diabetic Eye Exam: recommended yearly; up to date Diabetic Foot Exam: recommended yearly; at next visit   Vaccinations: Influenza vaccine: completed 09/17/19 Pneumococcal vaccine: up to date; last 03/23/15 Tdap vaccine: up to date; last 01/16/12  Shingles vaccine: Please call your insurance company to determine your out of pocket expense for the Shingrix vaccine. You may receive this vaccine at your local pharmacy.  Advanced directives: Please bring a copy of your POA (Power of Attorney) and/or Living Will to your next appointment.  Goals: Recommend to drink at least 6-8 8oz glasses of water per day and consume a balanced diet rich in fresh fruits and vegetables.   Next appointment: Please schedule your Annual Wellness Visit with your Nurse Health Advisor in one year.  Preventive Care 38 Years and Older, Male Preventive care refers to lifestyle choices and visits with your health care provider that can promote health and wellness. What does preventive care include?  A yearly physical exam. This is also called an annual well check.  Dental exams once or twice a year.  Routine eye exams. Ask your health care provider how often you should have your eyes checked.  Personal lifestyle choices, including:  Daily care of your teeth and gums.  Regular physical activity.  Eating a healthy diet.  Avoiding tobacco and drug use.  Limiting alcohol use.   Practicing safe sex.  Taking low doses of aspirin every day if recommended by your health care provider..  Taking vitamin and mineral supplements as recommended by your health care provider. What happens during an annual well check? The services and screenings done by your health care provider during your annual well check will depend on your age, overall health, lifestyle risk factors, and family history of disease. Counseling  Your health care provider may ask you questions about your:  Alcohol use.  Tobacco use.  Drug use.  Emotional well-being.  Home and relationship well-being.  Sexual activity.  Eating habits.  History of falls.  Memory and ability to understand (cognition).  Work and work Statistician. Screening  You may have the following tests or measurements:  Height, weight, and BMI.  Blood pressure.  Lipid and cholesterol levels. These may be checked every 5 years, or more frequently if you are over 69 years old.  Skin check.  Lung cancer screening. You may have this screening every year starting at age 52 if you have a 30-pack-year history of smoking and currently smoke or have quit within the past 15 years.  Fecal occult blood test (FOBT) of the stool. You may have this test every year starting at age 34.  Flexible sigmoidoscopy or colonoscopy. You may have a sigmoidoscopy every 5 years or a colonoscopy every 10 years starting at age 48.  Prostate cancer screening. Recommendations will vary depending on your family history and other risks.  Hepatitis C blood test.  Hepatitis B blood test.  Sexually transmitted disease (STD) testing.  Diabetes screening. This is done by checking your blood sugar (glucose)  after you have not eaten for a while (fasting). You may have this done every 1-3 years.  Abdominal aortic aneurysm (AAA) screening. You may need this if you are a current or former smoker.  Osteoporosis. You may be screened starting at age 29 if  you are at high risk. Talk with your health care provider about your test results, treatment options, and if necessary, the need for more tests. Vaccines  Your health care provider may recommend certain vaccines, such as:  Influenza vaccine. This is recommended every year.  Tetanus, diphtheria, and acellular pertussis (Tdap, Td) vaccine. You may need a Td booster every 10 years.  Zoster vaccine. You may need this after age 14.  Pneumococcal 13-valent conjugate (PCV13) vaccine. One dose is recommended after age 87.  Pneumococcal polysaccharide (PPSV23) vaccine. One dose is recommended after age 5. Talk to your health care provider about which screenings and vaccines you need and how often you need them. This information is not intended to replace advice given to you by your health care provider. Make sure you discuss any questions you have with your health care provider. Document Released: 01/08/2016 Document Revised: 08/31/2016 Document Reviewed: 10/13/2015 Elsevier Interactive Patient Education  2017 [place] Prevention in the Home Falls can cause injuries. They can happen to people of all ages. There are many things you can do to make your home safe and to help prevent falls. What can I do on the outside of my home?  Regularly fix the edges of walkways and driveways and fix any cracks.  Remove anything that might make you trip as you walk through a door, such as a raised step or threshold.  Trim any bushes or trees on the path to your home.  Use bright outdoor lighting.  Clear any walking paths of anything that might make someone trip, such as rocks or tools.  Regularly check to see if handrails are loose or broken. Make sure that both sides of any steps have handrails.  Any raised decks and porches should have guardrails on the edges.  Have any leaves, snow, or ice cleared regularly.  Use sand or salt on walking paths during winter.  Clean up any spills in  your garage right away. This includes oil or grease spills. What can I do in the bathroom?  Use night lights.  Install grab bars by the toilet and in the tub and shower. Do not use towel bars as grab bars.  Use non-skid mats or decals in the tub or shower.  If you need to sit down in the shower, use a plastic, non-slip stool.  Keep the floor dry. Clean up any water that spills on the floor as soon as it happens.  Remove soap buildup in the tub or shower regularly.  Attach bath mats securely with double-sided non-slip rug tape.  Do not have throw rugs and other things on the floor that can make you trip. What can I do in the bedroom?  Use night lights.  Make sure that you have a light by your bed that is easy to reach.  Do not use any sheets or blankets that are too big for your bed. They should not hang down onto the floor.  Have a firm chair that has side arms. You can use this for support while you get dressed.  Do not have throw rugs and other things on the floor that can make you trip. What can I do in the kitchen?  Clean up any spills right away.  Avoid walking on wet floors.  Keep items that you use a lot in easy-to-reach places.  If you need to reach something above you, use a strong step stool that has a grab bar.  Keep electrical cords out of the way.  Do not use floor polish or wax that makes floors slippery. If you must use wax, use non-skid floor wax.  Do not have throw rugs and other things on the floor that can make you trip. What can I do with my stairs?  Do not leave any items on the stairs.  Make sure that there are handrails on both sides of the stairs and use them. Fix handrails that are broken or loose. Make sure that handrails are as long as the stairways.  Check any carpeting to make sure that it is firmly attached to the stairs. Fix any carpet that is loose or worn.  Avoid having throw rugs at the top or bottom of the stairs. If you do have  throw rugs, attach them to the floor with carpet tape.  Make sure that you have a light switch at the top of the stairs and the bottom of the stairs. If you do not have them, ask someone to add them for you. What else can I do to help prevent falls?  Wear shoes that:  Do not have high heels.  Have rubber bottoms.  Are comfortable and fit you well.  Are closed at the toe. Do not wear sandals.  If you use a stepladder:  Make sure that it is fully opened. Do not climb a closed stepladder.  Make sure that both sides of the stepladder are locked into place.  Ask someone to hold it for you, if possible.  Clearly mark and make sure that you can see:  Any grab bars or handrails.  First and last steps.  Where the edge of each step is.  Use tools that help you move around (mobility aids) if they are needed. These include:  Canes.  Walkers.  Scooters.  Crutches.  Turn on the lights when you go into a dark area. Replace any light bulbs as soon as they burn out.  Set up your furniture so you have a clear path. Avoid moving your furniture around.  If any of your floors are uneven, fix them.  If there are any pets around you, be aware of where they are.  Review your medicines with your doctor. Some medicines can make you feel dizzy. This can increase your chance of falling. Ask your doctor what other things that you can do to help prevent falls. This information is not intended to replace advice given to you by your health care provider. Make sure you discuss any questions you have with your health care provider. Document Released: 10/08/2009 Document Revised: 05/19/2016 Document Reviewed: 01/16/2015 Elsevier Interactive Patient Education  2017 [employer].

## 2019-10-31 NOTE — Assessment & Plan Note (Signed)
Kidney function stable. Continue to avoid nephrotoxic medications and maintain good PO hydration.

## 2019-10-31 NOTE — Progress Notes (Signed)
Chief Complaint:  Andrew [name] is a 85 y.o. male who presents today for his annual comprehensive physical exam.    Assessment/Plan:  Actinic Keratoses Cryotherapy performed today. Patient tolerated well. See below procedure note.  Squamous Cell Carcinoma Cyrotherapy applied today as well. See below procedure note. Patient has a dermatologist - advised them to follow up ASAP for excision.  Stage 3 chronic kidney disease ([place]) Kidney function stable. Continue to avoid nephrotoxic medications and maintain good PO hydration.   Hypertension associated with diabetes ([place]) At goal. Continue hydralazine 50 mg 3 times daily, Imdur 30 mg daily, and Coreg 12.5 mg twice daily.  Depression, major, single episode, complete remission (HCC) Stable.  Continue Prozac 20 mg daily.  Hyperlipidemia associated with type 2 diabetes mellitus (HCC) Stable.  Continue atorvastatin 80 mg daily.  Hypothyroidism Continue levothyroxine 125 mcg daily.  Last TSH 0.32.   Cardiomyopathy, ischemic Stable.  Continue management per cardiology.  DM (diabetes mellitus), type 2 with renal complications (HCC) Last A1c 7.3 off medications.  Body mass index is 29.22 kg/m. / Overweight BMI Metric Follow Up - 10/31/19 1406      BMI Metric Follow Up-Please document annually   BMI Metric Follow Up  Education provided       Preventative Healthcare: UTD on vaccines and screenings.   Patient Counseling(The following topics were reviewed and/or handout was given):  -Nutrition: Stressed importance of moderation in sodium/caffeine intake, saturated fat and cholesterol, caloric balance, sufficient intake of fresh fruits, vegetables, and fiber.  -Stressed the importance of regular exercise.   -Substance Abuse: Discussed cessation/primary prevention of tobacco, alcohol, or other drug use; driving or other dangerous activities under the influence; availability of treatment for abuse.   -Injury prevention: Discussed  safety belts, safety helmets, smoke detector, smoking near bedding or upholstery.   -Sexuality: Discussed sexually transmitted diseases, partner selection, use of condoms, avoidance of unintended pregnancy and contraceptive alternatives.   -Dental health: Discussed importance of regular tooth brushing, flossing, and dental visits.  -Health maintenance and immunizations reviewed. Please refer to Health maintenance section.  Return to care in 1 year for next preventative visit.     Subjective:  HPI:  He has no acute complaints today.   Patient was seen about 6 weeks ago for suspicious lesion on his right forearm.  Shave biopsy was performed at that time.  Lesion has since grown back.  Mild pain to the area.  No treatments tried.  He also has several lesions on his scalp and ear. They are rough and seem to be increasing in size.   His stable, chronic medical conditions are outlined below:   #Coronary artery disease status post CABG / ischemic cardiomyopathy / CHF / PAF -Follows with cardiology - On aspirin 81 mg daily and atorvastatin 80 mg daily - On Lasix 120 mg twice daily, hydralazine 50 mg 3 times daily, Imdur 30 mg daily, and Coreg 12.5 mg twice daily  #Carotid artery stenosis / peripheral artery disease / AAA - Follows with vascular surgery  - On atorvastatin 80mg  daily - On Pletal 100mg  twice daily - On eliquis 2.5mg  twice daily  # Dyslipidemia - On atorvastatin 80 mg daily and Zetia 10 mg daily  # CKD Stage 3  # Hypothyroidism -On levothyroxine 125 mcg daily  # COPD - Follows with pulmonology for this -On Spiriva  # Depression / Anxiety / Insomnia -On Prozac 20 mg daily and Restoril 30 mg nightly as needed ROS: No reported SI or  HI  # T2DM - Diet controlled without medications  Lifestyle Diet: Balanced. Exercise: Limited.   Depression screen PHQ 2/9 12/22/2017  Decreased Interest 0  Down, Depressed, Hopeless 0  PHQ - 2 Score 0  Some recent data might be  hidden    Health Maintenance Due  Topic Date Due  . FOOT EXAM  09/19/2019     ROS: Per HPI, otherwise a complete review of systems was negative.   PMH:  The following were reviewed and entered/updated in epic: Past Medical History:  Diagnosis Date  . AAA (abdominal aortic aneurysm) ([place])   . Anemia   . Arthritis   . CAD (coronary artery disease)   . Cancer ([place])    Melanoma - Back  . Carotid artery stenosis   . CHF (congestive heart failure) ([place])   . Cyst of kidney, acquired   . Depression   . Diabetes mellitus   . DVT (deep venous thrombosis) ([place])   . Dysrhythmia   . Fatty liver 2008  . GERD (gastroesophageal reflux disease)   . Hyperlipidemia   . Hypertension   . IBS (irritable bowel syndrome)   . LVF (left ventricular failure) ([place])   . Paroxysmal atrial fibrillation (HCC)   . Pneumonia Jan. 2014  . Shortness of breath dyspnea   . Thyroid disease   . Typical atrial flutter ([place])   . UTI (urinary tract infection) 05/2015   While in [place]   . Vitamin D deficiency    Patient Active Problem List   Diagnosis Date Noted  . Chronic respiratory failure with hypoxia ([place]) 09/16/2018  . Bilateral recurrent inguinal hernias 11/28/2017  . Carotid artery stenosis 01/12/2016  . Chronic systolic CHF (congestive heart failure) ([place]) 12/02/2015  . 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 PVD (peripheral vascular disease) ([place])   . Hereditary and idiopathic peripheral neuropathy 10/02/2015  . Peripelvic (lymphatic) cyst   . Pernicious anemia 07/15/2015  . Bilateral renal cysts 07/12/2015  . Lung nodule, 39mm RML CT 07/12/15 07/12/2015  . Stage 3 chronic kidney disease 06/14/2015  . Paroxysmal atrial fibrillation ([place]) 05/28/2015  . AAA (abdominal aortic aneurysm) without rupture ([place]) 09/30/2014  . Cardiomyopathy, ischemic 05/23/2014  . DM (diabetes mellitus), type 2 with renal complications ([place]) 19/62/2297  . Depression, major, single  episode, complete remission ([place]) 03/18/2010  . Hypothyroidism 02/03/2009  . Osteoarthritis 05/27/2008  . Hyperlipidemia associated with type 2 diabetes mellitus ([place]) 06/14/2007  . Hypertension associated with diabetes ([place]) 06/14/2007  . GERD 06/14/2007   Past Surgical History:  Procedure Laterality Date  . ABDOMINAL AORTIC ANEURYSM REPAIR  2002  . CARDIAC CATHETERIZATION    . CORONARY ANGIOPLASTY    . CORONARY ANGIOPLASTY WITH STENT PLACEMENT  2005  . CORONARY ARTERY BYPASS GRAFT  1992  . EYE SURGERY     [place]  . HERNIA REPAIR    . INGUINAL HERNIA REPAIR Bilateral    w/mesh  . INSERTION OF MESH N/A 11/28/2017   Procedure: INSERTION OF MESH;  Surgeon: Donnie Mesa, MD;  Location: [facility];  Service: General;  Laterality: N/A;  GENERAL AND TAP BLOCK  . KNEE SURGERY Bilateral   . LAPAROSCOPIC INGUINAL HERNIA WITH UMBILICAL HERNIA Bilateral 11/28/2017   Procedure: LAPAROSCOPIC BILATERAL INGUINAL HERNIA REPAIR WITH MESH, UMBILICAL HERNIA REPAIR WITH MESH;  Surgeon: Donnie Mesa, MD;  Location: [facility];  Service: General;  Laterality: Bilateral;  GENERAL AND TAP BLOCK  . LEFT HEART CATHETERIZATION WITH CORONARY ANGIOGRAM N/A 10/30/2014  Procedure: LEFT HEART CATHETERIZATION WITH CORONARY ANGIOGRAM;  Surgeon: Larey Dresser, MD;  Location: [facility] CATH LAB;  Service: Cardiovascular;  Laterality: N/A;  . QUADRICEPS TENDON REPAIR Bilateral 08/2003   Archie Endo 05/10/2011  . UMBILICAL HERNIA REPAIR  11/28/2017   w/mesh    Family History  Problem Relation Age of Onset  . Colon cancer Father   . Coronary artery disease Brother   . Diabetes Brother   . Heart disease Brother   . Hyperlipidemia Brother   . Peripheral vascular disease Brother        Varicose Veins  . Throat cancer Brother        abdominal cancer?   . Diabetes Son   . Hyperlipidemia Son     Medications- reviewed and updated Current Outpatient Medications  Medication Sig Dispense Refill  . acetaminophen (TYLENOL) 500 MG  tablet Take 1,000 mg by mouth every 6 (six) hours as needed for mild pain or headache.    Marland Kitchen amiodarone (PACERONE) 200 MG tablet Take 0.5 tablets (100 mg total) by mouth daily. 45 tablet 3  . apixaban (ELIQUIS) 2.5 MG TABS tablet Take 1 tablet (2.5 mg total) by mouth 2 (two) times daily. 180 tablet 3  . atorvastatin (LIPITOR) 80 MG tablet Take 1 tablet (80 mg total) by mouth daily. 30 tablet 5  . carvedilol (COREG) 12.5 MG tablet Take 1 tablet (12.5 mg total) by mouth 2 (two) times daily. 180 tablet 0  . cilostazol (PLETAL) 100 MG tablet Take 1 tablet (100 mg total) by mouth 2 (two) times daily. 180 tablet 3  . ezetimibe (ZETIA) 10 MG tablet TAKE (1) TABLET DAILY. 90 tablet 0  . FLUoxetine (PROZAC) 20 MG capsule TAKE (1) CAPSULE DAILY. 90 capsule 0  . furosemide (LASIX) 80 MG tablet TAKE 1&1/2 TABLETS IN THE MORNING AND 1 TABLET IN THE AFTERNOON. 225 tablet 6  . gabapentin (NEURONTIN) 100 MG capsule Take 100 mg by mouth 3 (three) times daily.    Marland Kitchen glucose blood (ACCU-CHEK AVIVA PLUS) test strip CHECK BLOOD SUGAR 4 TIMES A DAY. 100 each 11  . hydrALAZINE (APRESOLINE) 50 MG tablet Take 1 tablet (50 mg total) by mouth 3 (three) times daily. 270 tablet 3  . HYDROcodone-acetaminophen (NORCO/VICODIN) 5-325 MG tablet Take 1 tablet by mouth daily as needed for moderate pain.     . Influenza Vac A&B Surf Ant Adj (FLUAD) 0.5 ML SUSY     . isosorbide mononitrate (IMDUR) 30 MG 24 hr tablet TAKE 1 TABLET EACH DAY. 90 tablet 3  . Lancets MISC Check blood sugar 4 times a day 100 each 11  . levothyroxine (SYNTHROID) 125 MCG tablet TAKE 1 TABLET EACH DAY. 90 tablet 0  . Multiple Vitamin (MULTIVITAMIN) tablet Take 1 tablet by mouth daily.    . nitroGLYCERIN (NITROSTAT) 0.4 MG SL tablet DISSOLVE 1 TABLET UNDER TONGUE AS NEEDED FOR CHEST PAIN,MAY REPEAT IN5 MINUTES FOR 2 DOSES. 25 tablet 3  . oxyCODONE (OXY IR/ROXICODONE) 5 MG immediate release tablet oxycodone 5 mg tablet    . potassium chloride SA (K-DUR) 20 MEQ  tablet Take 1 tablet (20 mEq total) by mouth daily. 90 tablet 1  . SPIRIVA HANDIHALER 18 MCG inhalation capsule PLACE 1 CAPSULE INTO INHALER AND INHALE ONCE DAILY. (Patient taking differently: Place 18 mcg into inhaler and inhale daily. ) 90 capsule 0  . temazepam (RESTORIL) 30 MG capsule TAKE 1 CAPSULE AT BEDTIME AS NEEDED FOR SLEEP. 30 capsule 0  . traMADol (ULTRAM) 50 MG tablet  Take 1 tablet (50 mg total) by mouth every 6 (six) hours as needed. 12 tablet 0   No current facility-administered medications for this visit.     Allergies-reviewed and updated Allergies  Allergen Reactions  . Metoprolol Shortness Of Breath, Palpitations and Other (See Comments)    Heart starts racing. Shallow breathing; pt states he also get skin irritation on his legs  . Metformin Hives and Swelling    On legs  . Metformin And Related Nausea And Vomiting    Social History   Socioeconomic History  . Marital status: Married    Spouse name: Not on file  . Number of children: Not on file  . Years of education: Not on file  . Highest education level: Not on file  Occupational History  . 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 Financial resource strain: Not on file  . Food insecurity    Worry: Not on file    Inability: Not on file  . Transportation needs    Medical: Not on file    Non-medical: Not on file  Tobacco Use  . Smoking status: Former Smoker    Packs/day: 1.50    Years: 35.00    Pack years: 52.50    Types: Cigarettes  . Smokeless tobacco: Never Used  . Tobacco comment: quit atleast 25 yrs ago, per pt  Substance and Sexual Activity  . Alcohol use: Yes    Alcohol/week: 3.0 standard drinks    Types: 3 Shots of liquor per week    Comment: socially  . Drug use: No  . Sexual activity: Yes  Lifestyle  . Physical activity    Days per week: Not on file    Minutes per session: Not on file  . Stress: Not on file  Relationships  . Social Herbalist on phone: Not on file    Gets  together: Not on file    Attends religious service: Not on file    Active member of club or organization: Not on file    Attends meetings of clubs or organizations: Not on file    Relationship status: Not on file  Other Topics Concern  . Not on file  Social History Narrative   Pt lives in [place] with spouse.   Retired from [employer].  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        Objective:  Physical Exam: BP (!) 112/58   Pulse 66   Temp 98.1 F (36.7 C)   Ht 5\' 8"  (1.727 m)   Wt 192 lb 3.2 oz (87.2 kg)   SpO2 97%   BMI 29.22 kg/m   Body mass index is 29.22 kg/m. Wt Readings from Last 3 Encounters:  10/31/19 192 lb 3.9 oz (87.2 kg)  10/31/19 192 lb 3.2 oz (87.2 kg)  10/30/19 194 lb 3.2 oz (88.1 kg)   Gen: NAD, resting comfortably HEENT: TMs normal bilaterally. OP clear. No thyromegaly noted.  CV: RRR with no murmurs appreciated Pulm: NWOB, CTAB with no crackles, wheezes, or rhonchi GI: Normal bowel sounds present. Soft, Nontender, Nondistended. MSK: no edema, cyanosis, or clubbing noted Skin: warm, dry.  Approximately 1.5 cm lesion with eschar on right anterior forearm.  Several scattered hyperkeratotic lesions across scalp, ears, and forehead. Neuro: CN2-12 grossly intact. Strength 5/5 in upper and lower extremities. Reflexes symmetric and intact bilaterally.  Psych: Normal affect and thought content  Cryotherapy Procedure Note  Pre-operative Diagnosis: Actinic keratosis, Squamous cell  carcinoma  Locations: Scalp, bilateral ears, right forearm  Indications: Therapeutic  Procedure Details  Patient informed of risks (permanent scarring, infection, light or dark discoloration, bleeding, infection, weakness, numbness and recurrence of the lesion) and benefits of the procedure and verbal informed consent obtained.  The patient's actinic keratoses on scalp and ears were treated with liquid nitrogen therapy, frozen  until ice ball extended 3 mm beyond lesion, allowed to thaw, and treated again.  A total of 5 lesions were treated.  The same procedure was also performed on the squamous cell carcinoma on his right forearm.  The patient tolerated procedure well.  The patient was instructed on post-op care, warned that there may be blister formation, redness and pain. Recommend OTC analgesia as needed for pain.  Condition: Stable  Complications: none.       Algis Greenhouse. Jerline Pain, MD 10/31/2019 2:11 PM

## 2019-10-31 NOTE — Progress Notes (Signed)
Subjective:   Andrew [name] is a 85 y.o. male who presents for Medicare Annual/Subsequent preventive examination.  Review of Systems:   Cardiac Risk Factors include: advanced age (>20men, >48 women);hypertension;male gender;diabetes mellitus    Objective:    Vitals: BP (!) 112/58   Temp 98.1 F (36.7 C)   Ht 5\' 8"  (1.727 m)   Wt 192 lb 3.9 oz (87.2 kg)   BMI 29.23 kg/m   Body mass index is 29.23 kg/m.  Advanced Directives 10/31/2019 07/02/2019 09/16/2018 11/28/2017 11/27/2017 08/27/2017 07/01/2017  Does Patient Have a Medical Advance Directive? Yes No No No Yes No Yes  Type of Paramedic of [place];Living will - - - [place];Living will - [place];Living will  Does patient want to make changes to medical advance directive? No - Patient declined - - - - - -  Copy of [place] in Chart? No - copy requested - - - No - copy requested - -  Would patient like information on creating a medical advance directive? - No - Patient declined No - Patient declined Yes (Inpatient - patient defers creating a medical advance directive at this time) - No - Patient declined -  Pre-existing out of facility DNR order (yellow form or pink MOST form) - - - - - - -    Tobacco Social History   Tobacco Use  Smoking Status Former Smoker  . Packs/day: 1.50  . Years: 35.00  . Pack years: 52.50  . Types: Cigarettes  Smokeless Tobacco Never Used  Tobacco Comment   quit atleast 25 yrs ago, per pt     Counseling given: Not Answered Comment: quit atleast 25 yrs ago, per pt   Clinical Intake:  Pre-visit preparation completed: Yes  Pain : No/denies pain  Diabetes: Yes CBG done?: No Did pt. bring in CBG monitor from home?: No  How often do you need to have someone help you when you read instructions, pamphlets, or other written materials from your doctor or pharmacy?: 1 - Never  Interpreter Needed?: No   Information entered by :: Denman Colbie LPN  Past Medical History:  Diagnosis Date  . AAA (abdominal aortic aneurysm) ([place])   . Anemia   . Arthritis   . CAD (coronary artery disease)   . Cancer ([place])    Melanoma - Back  . Carotid artery stenosis   . CHF (congestive heart failure) ([place])   . Cyst of kidney, acquired   . Depression   . Diabetes mellitus   . DVT (deep venous thrombosis) ([place])   . Dysrhythmia   . Fatty liver 2008  . GERD (gastroesophageal reflux disease)   . Hyperlipidemia   . Hypertension   . IBS (irritable bowel syndrome)   . LVF (left ventricular failure) ([place])   . Paroxysmal atrial fibrillation ([facility])   . Pneumonia Jan. 2014  . Shortness of breath dyspnea   . Thyroid disease   . Typical atrial flutter ([place])   . UTI (urinary tract infection) 05/2015   While in [place]   . Vitamin D deficiency    Past Surgical History:  Procedure Laterality Date  . ABDOMINAL AORTIC ANEURYSM REPAIR  2002  . CARDIAC CATHETERIZATION    . CORONARY ANGIOPLASTY    . CORONARY ANGIOPLASTY WITH STENT PLACEMENT  2005  . CORONARY ARTERY BYPASS GRAFT  1992  . EYE SURGERY     [place]  . HERNIA REPAIR    .  INGUINAL HERNIA REPAIR Bilateral    w/mesh  . INSERTION OF MESH N/A 11/28/2017   Procedure: INSERTION OF MESH;  Surgeon: Donnie Mesa, MD;  Location: [facility];  Service: General;  Laterality: N/A;  GENERAL AND TAP BLOCK  . KNEE SURGERY Bilateral   . LAPAROSCOPIC INGUINAL HERNIA WITH UMBILICAL HERNIA Bilateral 11/28/2017   Procedure: LAPAROSCOPIC BILATERAL INGUINAL HERNIA REPAIR WITH MESH, UMBILICAL HERNIA REPAIR WITH MESH;  Surgeon: Donnie Mesa, MD;  Location: [facility];  Service: General;  Laterality: Bilateral;  GENERAL AND TAP BLOCK  . LEFT HEART CATHETERIZATION WITH CORONARY ANGIOGRAM N/A 10/30/2014   Procedure: LEFT HEART CATHETERIZATION WITH CORONARY ANGIOGRAM;  Surgeon: Larey Dresser, MD;  Location[facility] C[facility];  Service: Cardiovascular;  Laterality: N/A;  . QUADRICEPS TENDON  REPAIR Bilateral 08/2003   Archie Endo 05/10/2011  . UMBILICAL HERNIA REPAIR  11/28/2017   w/mesh   Family History  Problem Relation Age of Onset  . Colon cancer Father   . Coronary artery disease Brother   . Diabetes Brother   . Heart disease Brother   . Hyperlipidemia Brother   . Peripheral vascular disease Brother        Varicose Veins  . Throat cancer Brother        abdominal cancer?   . Diabetes Son   . Hyperlipidemia Son    Social History   Socioeconomic History  . Marital status: Married    Spouse name: Not on file  . Number of children: Not on file  . Years of education: Not on file  . Highest education level: Not on file  Occupational History  . 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 Financial resource strain: Not on file  . Food insecurity    Worry: Not on file    Inability: Not on file  . Transportation needs    Medical: Not on file    Non-medical: Not on file  Tobacco Use  . Smoking status: Former Smoker    Packs/day: 1.50    Years: 35.00    Pack years: 52.50    Types: Cigarettes  . Smokeless tobacco: Never Used  . Tobacco comment: quit atleast 25 yrs ago, per pt  Substance and Sexual Activity  . Alcohol use: Yes    Alcohol/week: 3.0 standard drinks    Types: 3 Shots of liquor per week    Comment: socially  . Drug use: No  . Sexual activity: Yes  Lifestyle  . Physical activity    Days per week: Not on file    Minutes per session: Not on file  . Stress: Not on file  Relationships  . Social Herbalist on phone: Not on file    Gets together: Not on file    Attends religious service: Not on file    Active member of club or organization: Not on file    Attends meetings of clubs or organizations: Not on file    Relationship status: Not on file  Other Topics Concern  . Not on file  Social History Narrative   Pt lives in [place] with spouse.   Retired from [employer].  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    Outpatient Encounter Medications as of 10/31/2019  Medication Sig  . acetaminophen (TYLENOL) 500 MG tablet Take 1,000 mg by mouth every 6 (six) hours as needed for mild pain or headache.  Marland Kitchen amiodarone (PACERONE) 200 MG tablet Take 0.5 tablets (100 mg total) by  mouth daily.  Marland Kitchen apixaban (ELIQUIS) 2.5 MG TABS tablet Take 1 tablet (2.5 mg total) by mouth 2 (two) times daily.  Marland Kitchen atorvastatin (LIPITOR) 80 MG tablet Take 1 tablet (80 mg total) by mouth daily.  . carvedilol (COREG) 12.5 MG tablet Take 1 tablet (12.5 mg total) by mouth 2 (two) times daily.  . cilostazol (PLETAL) 100 MG tablet Take 1 tablet (100 mg total) by mouth 2 (two) times daily.  Marland Kitchen ezetimibe (ZETIA) 10 MG tablet TAKE (1) TABLET DAILY.  Marland Kitchen FLUoxetine (PROZAC) 20 MG capsule TAKE (1) CAPSULE DAILY.  . furosemide (LASIX) 80 MG tablet TAKE 1&1/2 TABLETS IN THE MORNING AND 1 TABLET IN THE AFTERNOON.  Marland Kitchen gabapentin (NEURONTIN) 100 MG capsule Take 100 mg by mouth 3 (three) times daily.  Marland Kitchen glucose blood (ACCU-CHEK AVIVA PLUS) test strip CHECK BLOOD SUGAR 4 TIMES A DAY.  . hydrALAZINE (APRESOLINE) 50 MG tablet Take 1 tablet (50 mg total) by mouth 3 (three) times daily.  Marland Kitchen HYDROcodone-acetaminophen (NORCO/VICODIN) 5-325 MG tablet Take 1 tablet by mouth daily as needed for moderate pain.   . Influenza Vac A&B Surf Ant Adj (FLUAD) 0.5 ML SUSY   . isosorbide mononitrate (IMDUR) 30 MG 24 hr tablet TAKE 1 TABLET EACH DAY.  Marland Kitchen Lancets MISC Check blood sugar 4 times a day  . levothyroxine (SYNTHROID) 125 MCG tablet TAKE 1 TABLET EACH DAY.  . Multiple Vitamin (MULTIVITAMIN) tablet Take 1 tablet by mouth daily.  . nitroGLYCERIN (NITROSTAT) 0.4 MG SL tablet DISSOLVE 1 TABLET UNDER TONGUE AS NEEDED FOR CHEST PAIN,MAY REPEAT IN5 MINUTES FOR 2 DOSES.  Marland Kitchen oxyCODONE (OXY IR/ROXICODONE) 5 MG immediate release tablet oxycodone 5 mg tablet  . potassium chloride SA (K-DUR) 20 MEQ tablet Take 1 tablet (20 mEq total) by mouth daily.  Marland Kitchen  SPIRIVA HANDIHALER 18 MCG inhalation capsule PLACE 1 CAPSULE INTO INHALER AND INHALE ONCE DAILY. (Patient taking differently: Place 18 mcg into inhaler and inhale daily. )  . temazepam (RESTORIL) 30 MG capsule TAKE 1 CAPSULE AT BEDTIME AS NEEDED FOR SLEEP.  Marland Kitchen traMADol (ULTRAM) 50 MG tablet Take 1 tablet (50 mg total) by mouth every 6 (six) hours as needed.   No facility-administered encounter medications on file as of 10/31/2019.     Activities of Daily Living In your present state of health, do you have any difficulty performing the following activities: 10/31/2019 07/02/2019  Hearing? Y N  Vision? N N  Difficulty concentrating or making decisions? N N  Walking or climbing stairs? Y Y  Dressing or bathing? N Y  Doing errands, shopping? N Y  Conservation officer, nature and eating ? N -  Using the Toilet? N -  In the past six months, have you accidently leaked urine? N -  Do you have problems with loss of bowel control? N -  Managing your Medications? N -  Managing your Finances? N -  Housekeeping or managing your Housekeeping? N -  Some recent data might be hidden    Patient Care Team: [name], MD as PCP - General (Family Medicine) Larey Dresser, MD as PCP - Advanced Heart Failure (Cardiology) Larey Dresser, MD as Consulting Physician (Cardiology) Jorge Ny, LCSW as Social Worker (Licensed Clinical Social Worker) Gardiner Barefoot, DPM as Consulting Physician (Podiatry)   Assessment:   This is a routine wellness examination for Andrew [name].  Exercise Activities and Dietary recommendations Current Exercise Habits: The patient does not participate in regular exercise at present  Goals   None  Fall Risk Fall Risk  10/31/2019 10/31/2019 12/22/2017 07/20/2017 07/20/2017  Falls in the past year? 0 0 No Yes Yes  Number falls in past yr: - - - 1 -  Injury with Fall? 0 - - No No  Risk Factor Category  - - - - -  Risk for fall due to : Impaired mobility;Impaired balance/gait - - -  Impaired balance/gait  Follow up Education provided;Falls prevention discussed;Falls evaluation completed - - - -   Is the patient's home free of loose throw rugs in walkways, pet beds, electrical cords, etc?   yes      Grab bars in the bathroom? yes      Handrails on the stairs?   yes      Adequate lighting?   yes  Timed Get Up and Go Performed: completed and within normal timeframe; shuffling gait noted    Depression Screen PHQ 2/9 Scores 10/31/2019 12/22/2017 07/11/2016 10/21/2015  PHQ - 2 Score 0 0 0 0  PHQ- 9 Score 0 - - -    Cognitive Function   6CIT Screen 10/31/2019  What Year? 0 points  What month? 0 points  What time? 0 points  Count back from 20 0 points  Months in reverse 0 points  Repeat phrase 2 points  Total Score 2    Immunization History  Administered Date(s) Administered  . Fluad Quad(high Dose 65+) 09/17/2019  . Influenza Split 10/26/2012  . Influenza Whole 10/16/2007, 11/04/2009, 09/09/2010, 10/27/2011  . Influenza, High Dose Seasonal PF 09/29/2014, 09/29/2017  . Influenza-Unspecified 10/04/2013, 10/05/2015, 09/28/2016  . Pneumococcal Conjugate-13 03/23/2015  . Pneumococcal Polysaccharide-23 04/04/2013  . Tdap 01/16/2012    Qualifies for Shingles Vaccine? Discussed and patient will check with pharmacy for coverage.  Patient education handout provided   Screening Tests Health Maintenance  Topic Date Due  . FOOT EXAM  09/19/2019  . OPHTHALMOLOGY EXAM  09/15/2020  . TETANUS/TDAP  01/15/2022  . INFLUENZA VACCINE  Completed  . PNA vac Low Risk Adult  Completed   Cancer Screenings: Lung: Low Dose CT Chest recommended if Age 53-80 years, 30 pack-year currently smoking OR have quit w/in 15years. Patient does not qualify. Colorectal: colonoscopy 04/27/12     Plan:  I have personally reviewed and addressed the Medicare Annual Wellness questionnaire and have noted the following in the patient's chart:  A. Medical and social history B. Use of alcohol,  tobacco or illicit drugs  C. Current medications and supplements D. Functional ability and status E.  Nutritional status F.  Physical activity G. Advance directives H. List of other physicians I.  Hospitalizations, surgeries, and ER visits in previous 12 months J.  [place] such as hearing and vision if needed, cognitive and depression L. Referrals, records requested, and appointments- none   In addition, I have reviewed and discussed with patient certain preventive protocols, quality metrics, and best practice recommendations. A written personalized care plan for preventive services as well as general preventive health recommendations were provided to patient.   Signed,  Denman Le, LPN  Nurse Health Advisor   Nurse Notes: no additional

## 2019-10-31 NOTE — Progress Notes (Signed)
Andrew [name] was seen at home today to refill his pillbox following his clinic appointment yesterday. He reported feeling well, denying chest pain, SOB, headache, dizziness, orthopnea, fever or cough. He stated he has not been on the stair-stepper machine since he was instructed to increase his exercise yesterday but he advised he would make an effort to do it moving forward. His medications were verified and his pillbox was refilled.   [name], EMT 10/31/19

## 2019-10-31 NOTE — Assessment & Plan Note (Signed)
Stable.  Continue Prozac 20 mg daily. 

## 2019-10-31 NOTE — Patient Instructions (Signed)
It was very nice to see you today!  Please schedule an appointment with the dermatologist ASAP.  Come back in 1 year for your next check up, or sooner if needed.   Take care, Dr Jerline Pain  Please try these tips to maintain a healthy lifestyle:   Eat at least 3 REAL meals and 1-2 snacks per day.  Aim for no more than 5 hours between eating.  If you eat breakfast, please do so within one hour of getting up.    Obtain twice as many fruits/vegetables as protein or carbohydrate foods for both lunch and dinner. (Half of each meal should be fruits/vegetables, one quarter protein, and one quarter starchy carbs)   Cut down on sweet beverages. This includes juice, soda, and sweet tea.    Exercise at least 150 minutes every week.    Preventive Care 83 Years and Older, Male Preventive care refers to lifestyle choices and visits with your health care provider that can promote health and wellness. This includes:  A yearly physical exam. This is also called an annual well check.  Regular dental and eye exams.  Immunizations.  Screening for certain conditions.  Healthy lifestyle choices, such as diet and exercise. What can I expect for my preventive care visit? Physical exam Your health care provider will check:  Height and weight. These may be used to calculate body mass index (BMI), which is a measurement that tells if you are at a healthy weight.  Heart rate and blood pressure.  Your skin for abnormal spots. Counseling Your health care provider may ask you questions about:  Alcohol, tobacco, and drug use.  Emotional well-being.  Home and relationship well-being.  Sexual activity.  Eating habits.  History of falls.  Memory and ability to understand (cognition).  Work and work Statistician. What immunizations do I need?  Influenza (flu) vaccine  This is recommended every year. Tetanus, diphtheria, and pertussis (Tdap) vaccine  You may need a Td booster every 10  years. Varicella (chickenpox) vaccine  You may need this vaccine if you have not already been vaccinated. Zoster (shingles) vaccine  You may need this after age 83. Pneumococcal conjugate (PCV13) vaccine  One dose is recommended after age 83. Pneumococcal polysaccharide (PPSV23) vaccine  One dose is recommended after age 833. Measles, mumps, and rubella (MMR) vaccine  You may need at least one dose of MMR if you were born in 1957 or later. You may also need a second dose. Meningococcal conjugate (MenACWY) vaccine  You may need this if you have certain conditions. Hepatitis A vaccine  You may need this if you have certain conditions or if you travel or work in places where you may be exposed to hepatitis A. Hepatitis B vaccine  You may need this if you have certain conditions or if you travel or work in places where you may be exposed to hepatitis B. Haemophilus influenzae type b (Hib) vaccine  You may need this if you have certain conditions. You may receive vaccines as individual doses or as more than one vaccine together in one shot (combination vaccines). Talk with your health care provider about the risks and benefits of combination vaccines. What tests do I need? Blood tests  Lipid and cholesterol levels. These may be checked every 5 years, or more frequently depending on your overall health.  Hepatitis C test.  Hepatitis B test. Screening  Lung cancer screening. You may have this screening every year starting at age 83 if you have a  30-pack-year history of smoking and currently smoke or have quit within the past 15 years.  Colorectal cancer screening. All adults should have this screening starting at age 83 and continuing until age 83. Your health care provider may recommend screening at age 45 if you are at increased risk. You will have tests every 1-10 years, depending on your results and the type of screening test.  Prostate cancer screening. Recommendations will  vary depending on your family history and other risks.  Diabetes screening. This is done by checking your blood sugar (glucose) after you have not eaten for a while (fasting). You may have this done every 1-3 years.  Abdominal aortic aneurysm (AAA) screening. You may need this if you are a current or former smoker.  Sexually transmitted disease (STD) testing. Follow these instructions at home: Eating and drinking  Eat a diet that includes fresh fruits and vegetables, whole grains, lean protein, and low-fat dairy products. Limit your intake of foods with high amounts of sugar, saturated fats, and salt.  Take vitamin and mineral supplements as recommended by your health care provider.  Do not drink alcohol if your health care provider tells you not to drink.  If you drink alcohol: ? Limit how much you have to 0-2 drinks a day. ? Be aware of how much alcohol is in your drink. In the [place] one drink equals one 12 oz bottle of beer (355 mL), one 5 oz glass of wine (148 mL), or one 1 oz glass of hard liquor (44 mL). Lifestyle  Take daily care of your teeth and gums.  Stay active. Exercise for at least 30 minutes on 5 or more days each week.  Do not use any products that contain nicotine or tobacco, such as cigarettes, e-cigarettes, and chewing tobacco. If you need help quitting, ask your health care provider.  If you are sexually active, practice safe sex. Use a condom or other form of protection to prevent STIs (sexually transmitted infections).  Talk with your health care provider about taking a low-dose aspirin or statin. What's next?  Visit your health care provider once a year for a well check visit.  Ask your health care provider how often you should have your eyes and teeth checked.  Stay up to date on all vaccines. This information is not intended to replace advice given to you by your health care provider. Make sure you discuss any questions you have with your health care  provider. Document Released: 01/08/2016 Document Revised: 12/06/2018 Document Reviewed: 12/06/2018 Elsevier Patient Education  2020 Elsevier Inc.  

## 2019-10-31 NOTE — Assessment & Plan Note (Signed)
Stable.  Continue management per cardiology. 

## 2019-10-31 NOTE — Telephone Encounter (Signed)
Copied from [place] [id number]. Topic: General - Other >> Oct 31, 2019  4:07 PM Leward Quan A wrote: Reason for CRM: Patient wife called to inform Dr Jerline Pain that patient have an appointment with [facility] on 11/01/2019 for his forearm.

## 2019-10-31 NOTE — Progress Notes (Signed)
I have personally reviewed the Medicare Annual Wellness Visit and agree with the assessment and plan.  Algis Greenhouse. Jerline Pain, MD 10/31/2019 2:55 PM

## 2019-10-31 NOTE — Telephone Encounter (Signed)
FYI

## 2019-10-31 NOTE — Assessment & Plan Note (Signed)
At goal. Continue hydralazine 50 mg 3 times daily, Imdur 30 mg daily, and Coreg 12.5 mg twice daily.

## 2019-10-31 NOTE — Assessment & Plan Note (Signed)
Last A1c 7.3 off medications.

## 2019-10-31 NOTE — Assessment & Plan Note (Signed)
Continue levothyroxine 125 mcg daily.  Last TSH 0.32.

## 2019-10-31 NOTE — Telephone Encounter (Signed)
Order, OV note, stop bang and demographics all faxed to [facility] at [phone] via epic  

## 2019-11-01 ENCOUNTER — Other Ambulatory Visit (HOSPITAL_COMMUNITY): Payer: Self-pay

## 2019-11-01 ENCOUNTER — Telehealth (HOSPITAL_COMMUNITY): Payer: Self-pay

## 2019-11-01 DIAGNOSIS — E039 Hypothyroidism, unspecified: Secondary | ICD-10-CM

## 2019-11-01 MED ORDER — LEVOTHYROXINE SODIUM 100 MCG PO TABS: 100.0000 ug | ORAL_TABLET | Freq: Every day | ORAL | 1 refills | Status: DC

## 2019-11-01 NOTE — Telephone Encounter (Signed)
Orders Placed This Encounter  Procedures  . TSH    Standing Status:   Future    Standing Expiration Date:   10/31/2020   Requested Prescriptions   Signed Prescriptions Disp Refills  . levothyroxine (SYNTHROID) 100 MCG tablet 90 tablet 1    Sig: Take 1 tablet (100 mcg total) by mouth daily before breakfast.    Authorizing Provider: Larey Dresser    Ordering User: Lavoy Bernards R

## 2019-11-01 NOTE — Telephone Encounter (Signed)
-----   Message from Larey Dresser, MD sent at 11/01/2019 12:09 AM EST ----- Decrease Levoxyl to 100 mcg daily and repeat TSH in 3 wks.

## 2019-11-01 NOTE — Progress Notes (Signed)
I saw Mr Borowiak at home today to make a change to his pillbox. He was instructed by the HF clinic to decrease his levothyroxine to 100 mcg daily. Ms Baltimore had already retrieved his medications from the pharmacy so I just swapped the medications. Nothing further was needed at this time. I will follow up next week.  [name], EMT 11/01/19

## 2019-11-01 NOTE — Telephone Encounter (Signed)
Noted. Glad that he was able to be seen promptly.  Algis Greenhouse. Jerline Pain, MD 11/01/2019 7:54 AM

## 2019-11-07 ENCOUNTER — Telehealth: Payer: Self-pay | Admitting: Family Medicine

## 2019-11-07 ENCOUNTER — Other Ambulatory Visit (HOSPITAL_COMMUNITY): Payer: Self-pay

## 2019-11-07 NOTE — Telephone Encounter (Signed)
Will be on the lookout.  Have not yet seen any forms.

## 2019-11-07 NOTE — Progress Notes (Signed)
Paramedicine Encounter    Patient ID: HILLIS MCPHATTER, male    DOB: 01-11-1928, 85 y.o.   MRN: [id number]   Patient Care Team: [name], MD as PCP - General (Family Medicine) Larey Dresser, MD as PCP - Advanced Heart Failure (Cardiology) Larey Dresser, MD as Consulting Physician (Cardiology) Jorge Ny, LCSW as Social Worker (Licensed Clinical Social Worker) Gardiner Barefoot, DPM as Consulting Physician (Podiatry)  Patient Active Problem List   Diagnosis Date Noted  . Chronic respiratory failure with hypoxia ([place]) 09/16/2018  . Bilateral recurrent inguinal hernias 11/28/2017  . Carotid artery stenosis 01/12/2016  . Chronic systolic CHF (congestive heart failure) ([place]) 12/02/2015  . 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 ([place]) 10/16/2015  . PVD (peripheral vascular disease) ([place])   . Hereditary and idiopathic peripheral neuropathy 10/02/2015  . Peripelvic (lymphatic) cyst   . Pernicious anemia 07/15/2015  . Bilateral renal cysts 07/12/2015  . Lung nodule, 18mm RML CT 07/12/15 07/12/2015  . Stage 3 chronic kidney disease 06/14/2015  . Paroxysmal atrial fibrillation ([place]) 05/28/2015  . AAA (abdominal aortic aneurysm) without rupture ([place]) 09/30/2014  . Cardiomyopathy, ischemic 05/23/2014  . DM (diabetes mellitus), type 2 with renal complications ([place]) 53/97/6734  . Depression, major, single episode, complete remission ([place]) 03/18/2010  . Hypothyroidism 02/03/2009  . Osteoarthritis 05/27/2008  . Hyperlipidemia associated with type 2 diabetes mellitus ([place]) 06/14/2007  . Hypertension associated with diabetes ([place]) 06/14/2007  . GERD 06/14/2007    Current Outpatient Medications:  .  acetaminophen (TYLENOL) 500 MG tablet, Take 1,000 mg by mouth every 6 (six) hours as needed for mild pain or headache., Disp: , Rfl:  .  amiodarone (PACERONE) 200 MG tablet, Take 0.5 tablets (100 mg total) by mouth daily., Disp: 45 tablet, Rfl: 3 .  apixaban (ELIQUIS) 2.5 MG TABS  tablet, Take 1 tablet (2.5 mg total) by mouth 2 (two) times daily., Disp: 180 tablet, Rfl: 3 .  atorvastatin (LIPITOR) 80 MG tablet, Take 1 tablet (80 mg total) by mouth daily., Disp: 30 tablet, Rfl: 5 .  carvedilol (COREG) 12.5 MG tablet, Take 1 tablet (12.5 mg total) by mouth 2 (two) times daily., Disp: 180 tablet, Rfl: 0 .  cilostazol (PLETAL) 100 MG tablet, Take 1 tablet (100 mg total) by mouth 2 (two) times daily., Disp: 180 tablet, Rfl: 3 .  ezetimibe (ZETIA) 10 MG tablet, TAKE (1) TABLET DAILY., Disp: 90 tablet, Rfl: 0 .  FLUoxetine (PROZAC) 20 MG capsule, TAKE (1) CAPSULE DAILY., Disp: 90 capsule, Rfl: 0 .  furosemide (LASIX) 80 MG tablet, TAKE 1&1/2 TABLETS IN THE MORNING AND 1 TABLET IN THE AFTERNOON., Disp: 225 tablet, Rfl: 6 .  gabapentin (NEURONTIN) 100 MG capsule, Take 100 mg by mouth 3 (three) times daily., Disp: , Rfl:  .  hydrALAZINE (APRESOLINE) 50 MG tablet, Take 1 tablet (50 mg total) by mouth 3 (three) times daily., Disp: 270 tablet, Rfl: 3 .  isosorbide mononitrate (IMDUR) 30 MG 24 hr tablet, TAKE 1 TABLET EACH DAY., Disp: 90 tablet, Rfl: 3 .  levothyroxine (SYNTHROID) 100 MCG tablet, Take 1 tablet (100 mcg total) by mouth daily before breakfast., Disp: 90 tablet, Rfl: 1 .  Multiple Vitamin (MULTIVITAMIN) tablet, Take 1 tablet by mouth daily., Disp: , Rfl:  .  potassium chloride SA (K-DUR) 20 MEQ tablet, Take 1 tablet (20 mEq total) by mouth daily., Disp: 90 tablet, Rfl: 1 .  SPIRIVA HANDIHALER 18 MCG inhalation capsule, PLACE 1 CAPSULE INTO INHALER AND INHALE  ONCE DAILY. (Patient taking differently: Place 18 mcg into inhaler and inhale daily. ), Disp: 90 capsule, Rfl: 0 .  temazepam (RESTORIL) 30 MG capsule, TAKE 1 CAPSULE AT BEDTIME AS NEEDED FOR SLEEP., Disp: 30 capsule, Rfl: 0 .  glucose blood (ACCU-CHEK AVIVA PLUS) test strip, CHECK BLOOD SUGAR 4 TIMES A DAY., Disp: 100 each, Rfl: 11 .  HYDROcodone-acetaminophen (NORCO/VICODIN) 5-325 MG tablet, Take 1 tablet by mouth daily  as needed for moderate pain. , Disp: , Rfl:  .  Influenza Vac A&B Surf Ant Adj (FLUAD) 0.5 ML SUSY, , Disp: , Rfl:  .  Lancets MISC, Check blood sugar 4 times a day, Disp: 100 each, Rfl: 11 .  nitroGLYCERIN (NITROSTAT) 0.4 MG SL tablet, DISSOLVE 1 TABLET UNDER TONGUE AS NEEDED FOR CHEST PAIN,MAY REPEAT IN5 MINUTES FOR 2 DOSES., Disp: 25 tablet, Rfl: 3 .  oxyCODONE (OXY IR/ROXICODONE) 5 MG immediate release tablet, oxycodone 5 mg tablet, Disp: , Rfl:  .  traMADol (ULTRAM) 50 MG tablet, Take 1 tablet (50 mg total) by mouth every 6 (six) hours as needed. (Patient not taking: Reported on 11/07/2019), Disp: 12 tablet, Rfl: 0 Allergies  Allergen Reactions  . Metoprolol Shortness Of Breath, Palpitations and Other (See Comments)    Heart starts racing. Shallow breathing; pt states he also get skin irritation on his legs  . Metformin Hives and Swelling    On legs  . Metformin And Related Nausea And Vomiting      Social History   Socioeconomic History  . Marital status: Married    Spouse name: Not on file  . Number of children: Not on file  . Years of education: Not on file  . Highest education level: Not on file  Occupational History  . 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 Financial resource strain: Not on file  . Food insecurity    Worry: Not on file    Inability: Not on file  . Transportation needs    Medical: Not on file    Non-medical: Not on file  Tobacco Use  . Smoking status: Former Smoker    Packs/day: 1.50    Years: 35.00    Pack years: 52.50    Types: Cigarettes  . Smokeless tobacco: Never Used  . Tobacco comment: quit atleast 25 yrs ago, per pt  Substance and Sexual Activity  . Alcohol use: Yes    Alcohol/week: 3.0 standard drinks    Types: 3 Shots of liquor per week    Comment: socially  . Drug use: No  . Sexual activity: Yes  Lifestyle  . Physical activity    Days per week: Not on file    Minutes per session: Not on file  . Stress: Not on file  Relationships   . Social Herbalist on phone: Not on file    Gets together: Not on file    Attends religious service: Not on file    Active member of club or organization: Not on file    Attends meetings of clubs or organizations: Not on file    Relationship status: Not on file  . Intimate partner violence    Fear of current or ex partner: Not on file    Emotionally abused: Not on file    Physically abused: Not on file    Forced sexual activity: Not on file  Other Topics Concern  . Not on file  Social History Narrative   Pt lives in [place] with spouse.  Retired from [employer].  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    Physical Exam Cardiovascular:     Pulses: Normal pulses.  Pulmonary:     Effort: Pulmonary effort is normal.     Breath sounds: Normal breath sounds.  Musculoskeletal: Normal range of motion.     Right lower leg: No edema.     Left lower leg: No edema.  Skin:    General: Skin is warm and dry.     Capillary Refill: Capillary refill takes less than 2 seconds.  Neurological:     Mental Status: He is alert and oriented to person, place, and time.  Psychiatric:        Mood and Affect: Mood normal.         Future Appointments  Date Time Provider [place]  11/25/2019 11:15 AM [facility] None  11/26/2019  2:00 PM Gardiner Barefoot, DPM [facility]  01/31/2020 10:20 AM Larey Dresser, MD [facility] None    BP 106/60 (BP Location: Left Arm, Patient Position: Sitting, Cuff Size: Normal)   Pulse 70   Resp 18   Wt 190 lb (86.2 kg)   SpO2 90%   BMI 28.89 kg/m   Weight yesterday- Did not weigh  Last visit weight- 192 lb  Mr Starling was seen at home today and reported feeling well. He denied chest pain, SOB, headache, dizziness, orthopnea, fever or cough since our visit last week. He stated he has been compliant with his medications and his weight has been stable. His medications were  verified and his pillbox was refilled I will follow up next week.   [name], EMT 11/07/19  ACTION: Home visit completed Next visit planned for 1 week

## 2019-11-07 NOTE — Telephone Encounter (Signed)
Copied from [place] [id number]. Topic: General - Inquiry >> Nov 07, 2019  8:32 AM Richardo Priest, NT wrote: Reason for CRM: Mrs.Mills called in stating she has sent multiple faxes in regards to patient being placed on oxygen. Mrs.Mills stated she will be refaxing it again to attention Dr.Parker. Please advise.

## 2019-11-13 ENCOUNTER — Telehealth (HOSPITAL_COMMUNITY): Payer: Self-pay | Admitting: Surgery

## 2019-11-13 NOTE — Telephone Encounter (Signed)
I attempted to contact and left message for patient regarding pending incomplete sleep study and equipment.  I requested a return call. 

## 2019-11-14 ENCOUNTER — Other Ambulatory Visit (HOSPITAL_COMMUNITY): Payer: Self-pay

## 2019-11-14 NOTE — Telephone Encounter (Signed)
See below

## 2019-11-14 NOTE — Progress Notes (Signed)
Paramedicine Encounter    Patient ID: IVAR DOMANGUE, male    DOB: February 21, 1928, 85 y.o.   MRN: [id number]   Patient Care Team: [name], MD as PCP - General (Family Medicine) Larey Dresser, MD as PCP - Advanced Heart Failure (Cardiology) Larey Dresser, MD as Consulting Physician (Cardiology) Jorge Ny, LCSW as Social Worker (Licensed Clinical Social Worker) Gardiner Barefoot, DPM as Consulting Physician (Podiatry)  Patient Active Problem List   Diagnosis Date Noted  . Chronic respiratory failure with hypoxia ([place]) 09/16/2018  . Bilateral recurrent inguinal hernias 11/28/2017  . Carotid artery stenosis 01/12/2016  . Chronic systolic CHF (congestive heart failure) ([place]) 12/02/2015  . 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 PVD (peripheral vascular disease) ([place])   . Hereditary and idiopathic peripheral neuropathy 10/02/2015  . Peripelvic (lymphatic) cyst   . Pernicious anemia 07/15/2015  . Bilateral renal cysts 07/12/2015  . Lung nodule, 42mm RML CT 07/12/15 07/12/2015  . Stage 3 chronic kidney disease 06/14/2015  . Paroxysmal atrial fibrillation ([place]) 05/28/2015  . AAA (abdominal aortic aneurysm) without rupture ([place]) 09/30/2014  . Cardiomyopathy, ischemic 05/23/2014  . DM (diabetes mellitus), type 2 with renal complications ([place]) 51/76/1607  . Depression, major, single episode, complete remission ([place]) 03/18/2010  . Hypothyroidism 02/03/2009  . Osteoarthritis 05/27/2008  . Hyperlipidemia associated with type 2 diabetes mellitus ([place]) 06/14/2007  . Hypertension associated with diabetes ([place]) 06/14/2007  . GERD 06/14/2007    Current Outpatient Medications:  .  acetaminophen (TYLENOL) 500 MG tablet, Take 1,000 mg by mouth every 6 (six) hours as needed for mild pain or headache., Disp: , Rfl:  .  amiodarone (PACERONE) 200 MG tablet, Take 0.5 tablets (100 mg total) by mouth daily., Disp: 45 tablet, Rfl: 3 .  apixaban (ELIQUIS) 2.5 MG TABS  tablet, Take 1 tablet (2.5 mg total) by mouth 2 (two) times daily., Disp: 180 tablet, Rfl: 3 .  atorvastatin (LIPITOR) 80 MG tablet, Take 1 tablet (80 mg total) by mouth daily., Disp: 30 tablet, Rfl: 5 .  carvedilol (COREG) 12.5 MG tablet, Take 1 tablet (12.5 mg total) by mouth 2 (two) times daily., Disp: 180 tablet, Rfl: 0 .  cilostazol (PLETAL) 100 MG tablet, Take 1 tablet (100 mg total) by mouth 2 (two) times daily., Disp: 180 tablet, Rfl: 3 .  ezetimibe (ZETIA) 10 MG tablet, TAKE (1) TABLET DAILY., Disp: 90 tablet, Rfl: 0 .  FLUoxetine (PROZAC) 20 MG capsule, TAKE (1) CAPSULE DAILY., Disp: 90 capsule, Rfl: 0 .  furosemide (LASIX) 80 MG tablet, TAKE 1&1/2 TABLETS IN THE MORNING AND 1 TABLET IN THE AFTERNOON., Disp: 225 tablet, Rfl: 6 .  gabapentin (NEURONTIN) 100 MG capsule, Take 100 mg by mouth 3 (three) times daily., Disp: , Rfl:  .  glucose blood (ACCU-CHEK AVIVA PLUS) test strip, CHECK BLOOD SUGAR 4 TIMES A DAY., Disp: 100 each, Rfl: 11 .  hydrALAZINE (APRESOLINE) 50 MG tablet, Take 1 tablet (50 mg total) by mouth 3 (three) times daily., Disp: 270 tablet, Rfl: 3 .  HYDROcodone-acetaminophen (NORCO/VICODIN) 5-325 MG tablet, Take 1 tablet by mouth daily as needed for moderate pain. , Disp: , Rfl:  .  Influenza Vac A&B Surf Ant Adj (FLUAD) 0.5 ML SUSY, , Disp: , Rfl:  .  isosorbide mononitrate (IMDUR) 30 MG 24 hr tablet, TAKE 1 TABLET EACH DAY., Disp: 90 tablet, Rfl: 3 .  Lancets MISC, Check blood sugar 4 times a day, Disp: 100 each, Rfl: 11 .  levothyroxine (SYNTHROID) 100 MCG tablet, Take 1 tablet (100 mcg total) by mouth daily before breakfast., Disp: 90 tablet, Rfl: 1 .  Multiple Vitamin (MULTIVITAMIN) tablet, Take 1 tablet by mouth daily., Disp: , Rfl:  .  nitroGLYCERIN (NITROSTAT) 0.4 MG SL tablet, DISSOLVE 1 TABLET UNDER TONGUE AS NEEDED FOR CHEST PAIN,MAY REPEAT IN5 MINUTES FOR 2 DOSES., Disp: 25 tablet, Rfl: 3 .  oxyCODONE (OXY IR/ROXICODONE) 5 MG immediate release tablet, oxycodone 5  mg tablet, Disp: , Rfl:  .  potassium chloride SA (K-DUR) 20 MEQ tablet, Take 1 tablet (20 mEq total) by mouth daily., Disp: 90 tablet, Rfl: 1 .  SPIRIVA HANDIHALER 18 MCG inhalation capsule, PLACE 1 CAPSULE INTO INHALER AND INHALE ONCE DAILY. (Patient taking differently: Place 18 mcg into inhaler and inhale daily. ), Disp: 90 capsule, Rfl: 0 .  temazepam (RESTORIL) 30 MG capsule, TAKE 1 CAPSULE AT BEDTIME AS NEEDED FOR SLEEP., Disp: 30 capsule, Rfl: 0 .  traMADol (ULTRAM) 50 MG tablet, Take 1 tablet (50 mg total) by mouth every 6 (six) hours as needed. (Patient not taking: Reported on 11/07/2019), Disp: 12 tablet, Rfl: 0 Allergies  Allergen Reactions  . Metoprolol Shortness Of Breath, Palpitations and Other (See Comments)    Heart starts racing. Shallow breathing; pt states he also get skin irritation on his legs  . Metformin Hives and Swelling    On legs  . Metformin And Related Nausea And Vomiting      Social History   Socioeconomic History  . Marital status: Married    Spouse name: Not on file  . Number of children: Not on file  . Years of education: Not on file  . Highest education level: Not on file  Occupational History  . 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 Financial resource strain: Not on file  . Food insecurity    Worry: Not on file    Inability: Not on file  . Transportation needs    Medical: Not on file    Non-medical: Not on file  Tobacco Use  . Smoking status: Former Smoker    Packs/day: 1.50    Years: 35.00    Pack years: 52.50    Types: Cigarettes  . Smokeless tobacco: Never Used  . Tobacco comment: quit atleast 25 yrs ago, per pt  Substance and Sexual Activity  . Alcohol use: Yes    Alcohol/week: 3.0 standard drinks    Types: 3 Shots of liquor per week    Comment: socially  . Drug use: No  . Sexual activity: Yes  Lifestyle  . Physical activity    Days per week: Not on file    Minutes per session: Not on file  . Stress: Not on file   Relationships  . Social Herbalist on phone: Not on file    Gets together: Not on file    Attends religious service: Not on file    Active member of club or organization: Not on file    Attends meetings of clubs or organizations: Not on file    Relationship status: Not on file  . Intimate partner violence    Fear of current or ex partner: Not on file    Emotionally abused: Not on file    Physically abused: Not on file    Forced sexual activity: Not on file  Other Topics Concern  . Not on file  Social History Narrative   Pt lives in [place] with spouse.   Retired from  [employer].  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    Physical Exam Cardiovascular:     Rate and Rhythm: Normal rate and regular rhythm.     Pulses: Normal pulses.  Pulmonary:     Effort: Pulmonary effort is normal.     Breath sounds: Normal breath sounds.  Musculoskeletal: Normal range of motion.     Right lower leg: No edema.     Left lower leg: No edema.  Skin:    General: Skin is warm and dry.     Capillary Refill: Capillary refill takes less than 2 seconds.  Neurological:     Mental Status: He is alert and oriented to person, place, and time.  Psychiatric:        Mood and Affect: Mood normal.         Future Appointments  Date Time Provider [place]  11/25/2019 11:15 AM [facility] None  11/26/2019  2:00 PM Gardiner Barefoot, DPM [facility]  01/31/2020 10:20 AM Larey Dresser, MD [facility] None    BP 130/76 (BP Location: Left Arm, Patient Position: Sitting, Cuff Size: Normal)   Pulse 76   Resp 16   Wt 190 lb (86.2 kg)   SpO2 94%   BMI 28.89 kg/m   Weight yesterday- did not weigh  Last visit weight- 190 lb  Mr Sens was seen at home today and reported feeling unwell. His main complain was abdominal pain with nausea. He denied chest pain, SOB, headache, dizziness, orthopnea, fever or cough since our last  visit. I advised that if his abdominal issues persist then he should contact Dr Jerline Pain for evaluation. He was understanding and agreeable. His medications were verified and his pillbox was refilled. I will follow up next week.   [name], EMT 11/14/19  ACTION: Home visit completed Next visit planned for 1 week

## 2019-11-14 NOTE — Telephone Encounter (Signed)
Robie [place] is calling from [facility] calling to see if the forms have been received via fax regarding the patient being placed on oxygen.  The forms have been faxed a total of 3 times 09/27/19, 10/07/19, 11/07/19.  Please advise Cb- [phone].

## 2019-11-20 ENCOUNTER — Other Ambulatory Visit (HOSPITAL_COMMUNITY): Payer: Self-pay

## 2019-11-20 NOTE — Progress Notes (Signed)
Paramedicine Encounter    Patient ID: Andrew [name], male    DOB: 1928/08/20, 85 y.o.   MRN: [id number]   Patient Care Team: [name], MD as PCP - General (Family Medicine) Larey Dresser, MD as PCP - Advanced Heart Failure (Cardiology) Larey Dresser, MD as Consulting Physician (Cardiology) Jorge Ny, LCSW as Social Worker (Licensed Clinical Social Worker) Gardiner Barefoot, DPM as Consulting Physician (Podiatry)  Patient Active Problem List   Diagnosis Date Noted  . Chronic respiratory failure with hypoxia ([place]) 09/16/2018  . Bilateral recurrent inguinal hernias 11/28/2017  . Carotid artery stenosis 01/12/2016  . Chronic systolic CHF (congestive heart failure) ([place]) 12/02/2015  . 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 ([place]) 10/16/2015  . PVD (peripheral vascular disease) ([place])   . Hereditary and idiopathic peripheral neuropathy 10/02/2015  . Peripelvic (lymphatic) cyst   . Pernicious anemia 07/15/2015  . Bilateral renal cysts 07/12/2015  . Lung nodule, 62mm RML CT 07/12/15 07/12/2015  . Stage 3 chronic kidney disease 06/14/2015  . Paroxysmal atrial fibrillation ([place]) 05/28/2015  . AAA (abdominal aortic aneurysm) without rupture ([place]) 09/30/2014  . Cardiomyopathy, ischemic 05/23/2014  . DM (diabetes mellitus), type 2 with renal complications ([place]) 01/77/9390  . Depression, major, single episode, complete remission ([place]) 03/18/2010  . Hypothyroidism 02/03/2009  . Osteoarthritis 05/27/2008  . Hyperlipidemia associated with type 2 diabetes mellitus ([place]) 06/14/2007  . Hypertension associated with diabetes ([place]) 06/14/2007  . GERD 06/14/2007    Current Outpatient Medications:  .  acetaminophen (TYLENOL) 500 MG tablet, Take 1,000 mg by mouth every 6 (six) hours as needed for mild pain or headache., Disp: , Rfl:  .  amiodarone (PACERONE) 200 MG tablet, Take 0.5 tablets (100 mg total) by mouth daily., Disp: 45 tablet, Rfl: 3 .  apixaban (ELIQUIS) 2.5 MG TABS  tablet, Take 1 tablet (2.5 mg total) by mouth 2 (two) times daily., Disp: 180 tablet, Rfl: 3 .  atorvastatin (LIPITOR) 80 MG tablet, Take 1 tablet (80 mg total) by mouth daily., Disp: 30 tablet, Rfl: 5 .  carvedilol (COREG) 12.5 MG tablet, Take 1 tablet (12.5 mg total) by mouth 2 (two) times daily., Disp: 180 tablet, Rfl: 0 .  cilostazol (PLETAL) 100 MG tablet, Take 1 tablet (100 mg total) by mouth 2 (two) times daily., Disp: 180 tablet, Rfl: 3 .  ezetimibe (ZETIA) 10 MG tablet, TAKE (1) TABLET DAILY., Disp: 90 tablet, Rfl: 0 .  FLUoxetine (PROZAC) 20 MG capsule, TAKE (1) CAPSULE DAILY., Disp: 90 capsule, Rfl: 0 .  furosemide (LASIX) 80 MG tablet, TAKE 1&1/2 TABLETS IN THE MORNING AND 1 TABLET IN THE AFTERNOON., Disp: 225 tablet, Rfl: 6 .  gabapentin (NEURONTIN) 100 MG capsule, Take 100 mg by mouth 3 (three) times daily., Disp: , Rfl:  .  glucose blood (ACCU-CHEK AVIVA PLUS) test strip, CHECK BLOOD SUGAR 4 TIMES A DAY., Disp: 100 each, Rfl: 11 .  hydrALAZINE (APRESOLINE) 50 MG tablet, Take 1 tablet (50 mg total) by mouth 3 (three) times daily., Disp: 270 tablet, Rfl: 3 .  HYDROcodone-acetaminophen (NORCO/VICODIN) 5-325 MG tablet, Take 1 tablet by mouth daily as needed for moderate pain. , Disp: , Rfl:  .  isosorbide mononitrate (IMDUR) 30 MG 24 hr tablet, TAKE 1 TABLET EACH DAY., Disp: 90 tablet, Rfl: 3 .  levothyroxine (SYNTHROID) 100 MCG tablet, Take 1 tablet (100 mcg total) by mouth daily before breakfast., Disp: 90 tablet, Rfl: 1 .  Multiple Vitamin (MULTIVITAMIN) tablet, Take 1 tablet by  mouth daily., Disp: , Rfl:  .  nitroGLYCERIN (NITROSTAT) 0.4 MG SL tablet, DISSOLVE 1 TABLET UNDER TONGUE AS NEEDED FOR CHEST PAIN,MAY REPEAT IN5 MINUTES FOR 2 DOSES., Disp: 25 tablet, Rfl: 3 .  oxyCODONE (OXY IR/ROXICODONE) 5 MG immediate release tablet, oxycodone 5 mg tablet, Disp: , Rfl:  .  potassium chloride SA (K-DUR) 20 MEQ tablet, Take 1 tablet (20 mEq total) by mouth daily., Disp: 90 tablet, Rfl: 1 .   SPIRIVA HANDIHALER 18 MCG inhalation capsule, PLACE 1 CAPSULE INTO INHALER AND INHALE ONCE DAILY. (Patient taking differently: Place 18 mcg into inhaler and inhale daily. ), Disp: 90 capsule, Rfl: 0 .  temazepam (RESTORIL) 30 MG capsule, TAKE 1 CAPSULE AT BEDTIME AS NEEDED FOR SLEEP., Disp: 30 capsule, Rfl: 0 .  Influenza Vac A&B Surf Ant Adj (FLUAD) 0.5 ML SUSY, , Disp: , Rfl:  .  Lancets MISC, Check blood sugar 4 times a day, Disp: 100 each, Rfl: 11 .  traMADol (ULTRAM) 50 MG tablet, Take 1 tablet (50 mg total) by mouth every 6 (six) hours as needed. (Patient not taking: Reported on 11/20/2019), Disp: 12 tablet, Rfl: 0 Allergies  Allergen Reactions  . Metoprolol Shortness Of Breath, Palpitations and Other (See Comments)    Heart starts racing. Shallow breathing; pt states he also get skin irritation on his legs  . Metformin Hives and Swelling    On legs  . Metformin And Related Nausea And Vomiting      Social History   Socioeconomic History  . Marital status: Married    Spouse name: Not on file  . Number of children: Not on file  . Years of education: Not on file  . Highest education level: Not on file  Occupational History  . 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 Financial resource strain: Not on file  . Food insecurity    Worry: Not on file    Inability: Not on file  . Transportation needs    Medical: Not on file    Non-medical: Not on file  Tobacco Use  . Smoking status: Former Smoker    Packs/day: 1.50    Years: 35.00    Pack years: 52.50    Types: Cigarettes  . Smokeless tobacco: Never Used  . Tobacco comment: quit atleast 25 yrs ago, per pt  Substance and Sexual Activity  . Alcohol use: Yes    Alcohol/week: 3.0 standard drinks    Types: 3 Shots of liquor per week    Comment: socially  . Drug use: No  . Sexual activity: Yes  Lifestyle  . Physical activity    Days per week: Not on file    Minutes per session: Not on file  . Stress: Not on file  Relationships   . Social Herbalist on phone: Not on file    Gets together: Not on file    Attends religious service: Not on file    Active member of club or organization: Not on file    Attends meetings of clubs or organizations: Not on file    Relationship status: Not on file  . Intimate partner violence    Fear of current or ex partner: Not on file    Emotionally abused: Not on file    Physically abused: Not on file    Forced sexual activity: Not on file  Other Topics Concern  . Not on file  Social History Narrative   Pt lives in [place] with spouse.  Retired from [employer].  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    Physical Exam Cardiovascular:     Rate and Rhythm: Normal rate and regular rhythm.     Pulses: Normal pulses.  Pulmonary:     Effort: Pulmonary effort is normal.     Breath sounds: Normal breath sounds.  Musculoskeletal: Normal range of motion.     Right lower leg: No edema.     Left lower leg: No edema.  Skin:    General: Skin is warm and dry.     Capillary Refill: Capillary refill takes less than 2 seconds.  Neurological:     Mental Status: He is alert and oriented to person, place, and time.  Psychiatric:        Mood and Affect: Mood normal.         Future Appointments  Date Time Provider [place]  11/25/2019 11:15 AM [facility] None  11/26/2019  2:00 PM Gardiner Barefoot, DPM [facility]  01/31/2020 10:20 AM Larey Dresser, MD [facility] None    BP 114/68 (BP Location: Right Arm, Patient Position: Sitting, Cuff Size: Normal)   Pulse 80   Resp 16   Wt 187 lb (84.8 kg)   SpO2 93%   BMI 28.43 kg/m   Weight yesterday- did not weigh Last visit weight- 190 lb  Mr Yuan was seen at home today and reported feeling well. He denied chest pain, SOB, headache, dizziness, orthopnea, fever or cough in the past week. He stated he has been compliant with his medications and his  weight has been stable. His medications were verified and his pillbox was refilled. I will follow up next week.   [name], EMT 11/20/19  ACTION: Home visit completed Next visit planned for 1 weeks

## 2019-11-20 NOTE — Telephone Encounter (Signed)
Left voice message for Andrew [name] from [facility]

## 2019-11-25 ENCOUNTER — Other Ambulatory Visit (HOSPITAL_COMMUNITY): Payer: Medicare Other

## 2019-11-26 ENCOUNTER — Other Ambulatory Visit: Payer: Self-pay

## 2019-11-26 ENCOUNTER — Encounter: Payer: Self-pay | Admitting: Podiatry

## 2019-11-26 ENCOUNTER — Ambulatory Visit: Payer: Medicare Other | Admitting: Podiatry

## 2019-11-26 DIAGNOSIS — M79676 Pain in unspecified toe(s): Secondary | ICD-10-CM

## 2019-11-26 DIAGNOSIS — E1159 Type 2 diabetes mellitus with other circulatory complications: Secondary | ICD-10-CM

## 2019-11-26 DIAGNOSIS — I739 Peripheral vascular disease, unspecified: Secondary | ICD-10-CM | POA: Diagnosis not present

## 2019-11-26 DIAGNOSIS — B351 Tinea unguium: Secondary | ICD-10-CM | POA: Diagnosis not present

## 2019-11-26 NOTE — Progress Notes (Signed)
Patient ID: Andrew [name], male   DOB: [birth year], 85 y.o.   MRN: [id number] Complaint:  Visit Type: Patient returns to my office for continued preventative foot care services. Complaint: Patient states" my nails have grown long and thick and become painful to walk and wear shoes" Patient has been diagnosed with DM with no foot complications. The patient presents for preventative foot care services. No changes to ROS.  Patient is on eliquiss and pletal.  Podiatric Exam: Vascular: dorsalis pedis and posterior tibial pulses are palpable bilateral. Capillary return is immediate. Temperature gradient is WNL. Skin turgor WNL  Sensorium: Normal Semmes Weinstein monofilament test. Normal tactile sensation bilaterally. Nail Exam: Pt has thick disfigured discolored nails with subungual debris noted bilateral entire nail hallux through fifth toenails Ulcer Exam: There is no evidence of ulcer or pre-ulcerative changes or infection. Orthopedic Exam: Muscle tone and strength are WNL. No limitations in general ROM. No crepitus or effusions noted. Foot type and digits show no abnormalities. Bony prominences are unremarkable. 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 Skin: No Porokeratosis. No infection or ulcers.  Asymptomatic callus right foot  Diagnosis:  Onychomycosis, , Pain in right toe, pain in left toes  Treatment & Plan Procedures and Treatment: Consent by patient was obtained for treatment procedures. The patient understood the discussion of treatment and procedures well. All questions were answered thoroughly reviewed. Debridement of mycotic and hypertrophic toenails, 1 through 5 bilateral and clearing of subungual debris. No ulceration, no infection noted.  Return Visit-Office Procedure: Patient instructed to return to the office for a follow up visit 10 weeks. for continued evaluation and treatment.   Gardiner Barefoot DPM

## 2019-11-27 ENCOUNTER — Ambulatory Visit (HOSPITAL_COMMUNITY)
Admission: RE | Admit: 2019-11-27 | Discharge: 2019-11-27 | Disposition: A | Discharge status: Home / Self Care | Payer: Medicare Other | Source: Ambulatory Visit | Attending: Internal Medicine | Admitting: Internal Medicine

## 2019-11-27 DIAGNOSIS — E039 Hypothyroidism, unspecified: Secondary | ICD-10-CM | POA: Insufficient documentation

## 2019-11-27 LAB — TSH: TSH: 1.062 u[IU]/mL (ref 0.350–4.500)

## 2019-11-28 ENCOUNTER — Other Ambulatory Visit (HOSPITAL_COMMUNITY): Payer: Self-pay

## 2019-11-28 NOTE — Progress Notes (Signed)
Paramedicine Encounter    Patient ID: Andrew [name], male    DOB: 1928-05-16, 85 y.o.   MRN: [id number]   Patient Care Team: [name], MD as PCP - General (Family Medicine) Larey Dresser, MD as PCP - Advanced Heart Failure (Cardiology) Larey Dresser, MD as Consulting Physician (Cardiology) Jorge Ny, LCSW as Social Worker (Licensed Clinical Social Worker) Gardiner Barefoot, DPM as Consulting Physician (Podiatry)  Patient Active Problem List   Diagnosis Date Noted  . Chronic respiratory failure with hypoxia ([place]) 09/16/2018  . Bilateral recurrent inguinal hernias 11/28/2017  . Carotid artery stenosis 01/12/2016  . Chronic systolic CHF (congestive heart failure) ([place]) 12/02/2015  . 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 PVD (peripheral vascular disease) ([place])   . Hereditary and idiopathic peripheral neuropathy 10/02/2015  . Peripelvic (lymphatic) cyst   . Pernicious anemia 07/15/2015  . Bilateral renal cysts 07/12/2015  . Lung nodule, 76mm RML CT 07/12/15 07/12/2015  . Stage 3 chronic kidney disease 06/14/2015  . Paroxysmal atrial fibrillation ([place]) 05/28/2015  . AAA (abdominal aortic aneurysm) without rupture ([place]) 09/30/2014  . Cardiomyopathy, ischemic 05/23/2014  . DM (diabetes mellitus), type 2 with renal complications ([place]) 32/44/0102  . Depression, major, single episode, complete remission ([place]) 03/18/2010  . Hypothyroidism 02/03/2009  . Osteoarthritis 05/27/2008  . Hyperlipidemia associated with type 2 diabetes mellitus ([place]) 06/14/2007  . Hypertension associated with diabetes ([place]) 06/14/2007  . GERD 06/14/2007    Current Outpatient Medications:  .  acetaminophen (TYLENOL) 500 MG tablet, Take 1,000 mg by mouth every 6 (six) hours as needed for mild pain or headache., Disp: , Rfl:  .  amiodarone (PACERONE) 200 MG tablet, Take 0.5 tablets (100 mg total) by mouth daily., Disp: 45 tablet, Rfl: 3 .  apixaban (ELIQUIS) 2.5 MG TABS  tablet, Take 1 tablet (2.5 mg total) by mouth 2 (two) times daily., Disp: 180 tablet, Rfl: 3 .  atorvastatin (LIPITOR) 80 MG tablet, Take 1 tablet (80 mg total) by mouth daily., Disp: 30 tablet, Rfl: 5 .  carvedilol (COREG) 12.5 MG tablet, Take 1 tablet (12.5 mg total) by mouth 2 (two) times daily., Disp: 180 tablet, Rfl: 0 .  cilostazol (PLETAL) 100 MG tablet, Take 1 tablet (100 mg total) by mouth 2 (two) times daily., Disp: 180 tablet, Rfl: 3 .  ezetimibe (ZETIA) 10 MG tablet, TAKE (1) TABLET DAILY., Disp: 90 tablet, Rfl: 0 .  FLUoxetine (PROZAC) 20 MG capsule, TAKE (1) CAPSULE DAILY., Disp: 90 capsule, Rfl: 0 .  furosemide (LASIX) 80 MG tablet, TAKE 1&1/2 TABLETS IN THE MORNING AND 1 TABLET IN THE AFTERNOON., Disp: 225 tablet, Rfl: 6 .  gabapentin (NEURONTIN) 100 MG capsule, Take 100 mg by mouth 3 (three) times daily., Disp: , Rfl:  .  glucose blood (ACCU-CHEK AVIVA PLUS) test strip, CHECK BLOOD SUGAR 4 TIMES A DAY., Disp: 100 each, Rfl: 11 .  hydrALAZINE (APRESOLINE) 50 MG tablet, Take 1 tablet (50 mg total) by mouth 3 (three) times daily., Disp: 270 tablet, Rfl: 3 .  HYDROcodone-acetaminophen (NORCO/VICODIN) 5-325 MG tablet, Take 1 tablet by mouth daily as needed for moderate pain. , Disp: , Rfl:  .  isosorbide mononitrate (IMDUR) 30 MG 24 hr tablet, TAKE 1 TABLET EACH DAY., Disp: 90 tablet, Rfl: 3 .  Lancets MISC, Check blood sugar 4 times a day, Disp: 100 each, Rfl: 11 .  levothyroxine (SYNTHROID) 100 MCG tablet, Take 1 tablet (100 mcg total) by mouth daily before  breakfast., Disp: 90 tablet, Rfl: 1 .  Multiple Vitamin (MULTIVITAMIN) tablet, Take 1 tablet by mouth daily., Disp: , Rfl:  .  nitroGLYCERIN (NITROSTAT) 0.4 MG SL tablet, DISSOLVE 1 TABLET UNDER TONGUE AS NEEDED FOR CHEST PAIN,MAY REPEAT IN5 MINUTES FOR 2 DOSES., Disp: 25 tablet, Rfl: 3 .  oxyCODONE (OXY IR/ROXICODONE) 5 MG immediate release tablet, oxycodone 5 mg tablet, Disp: , Rfl:  .  potassium chloride SA (K-DUR) 20 MEQ tablet,  Take 1 tablet (20 mEq total) by mouth daily., Disp: 90 tablet, Rfl: 1 .  SPIRIVA HANDIHALER 18 MCG inhalation capsule, PLACE 1 CAPSULE INTO INHALER AND INHALE ONCE DAILY. (Patient taking differently: Place 18 mcg into inhaler and inhale daily. ), Disp: 90 capsule, Rfl: 0 .  temazepam (RESTORIL) 30 MG capsule, TAKE 1 CAPSULE AT BEDTIME AS NEEDED FOR SLEEP., Disp: 30 capsule, Rfl: 0 .  Influenza Vac A&B Surf Ant Adj (FLUAD) 0.5 ML SUSY, , Disp: , Rfl:  .  traMADol (ULTRAM) 50 MG tablet, Take 1 tablet (50 mg total) by mouth every 6 (six) hours as needed. (Patient not taking: Reported on 11/28/2019), Disp: 12 tablet, Rfl: 0 Allergies  Allergen Reactions  . Metoprolol Shortness Of Breath, Palpitations and Other (See Comments)    Heart starts racing. Shallow breathing; pt states he also get skin irritation on his legs  . Metformin Hives and Swelling    On legs  . Metformin And Related Nausea And Vomiting      Social History   Socioeconomic History  . Marital status: Married    Spouse name: Not on file  . Number of children: Not on file  . Years of education: Not on file  . Highest education level: Not on file  Occupational History  . 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 Financial resource strain: Not on file  . Food insecurity    Worry: Not on file    Inability: Not on file  . Transportation needs    Medical: Not on file    Non-medical: Not on file  Tobacco Use  . Smoking status: Former Smoker    Packs/day: 1.50    Years: 35.00    Pack years: 52.50    Types: Cigarettes  . Smokeless tobacco: Never Used  . Tobacco comment: quit atleast 25 yrs ago, per pt  Substance and Sexual Activity  . Alcohol use: Yes    Alcohol/week: 3.0 standard drinks    Types: 3 Shots of liquor per week    Comment: socially  . Drug use: No  . Sexual activity: Yes  Lifestyle  . Physical activity    Days per week: Not on file    Minutes per session: Not on file  . Stress: Not on file  Relationships   . Social Herbalist on phone: Not on file    Gets together: Not on file    Attends religious service: Not on file    Active member of club or organization: Not on file    Attends meetings of clubs or organizations: Not on file    Relationship status: Not on file  . Intimate partner violence    Fear of current or ex partner: Not on file    Emotionally abused: Not on file    Physically abused: Not on file    Forced sexual activity: Not on file  Other Topics Concern  . Not on file  Social History Narrative   Pt lives in [place] with spouse.  Retired from [employer].  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    Physical Exam Cardiovascular:     Rate and Rhythm: Normal rate and regular rhythm.     Pulses: Normal pulses.  Pulmonary:     Effort: Pulmonary effort is normal.     Breath sounds: Normal breath sounds.  Musculoskeletal: Normal range of motion.     Right lower leg: No edema.     Left lower leg: No edema.  Skin:    General: Skin is warm and dry.     Capillary Refill: Capillary refill takes less than 2 seconds.  Neurological:     Mental Status: He is alert and oriented to person, place, and time.  Psychiatric:        Mood and Affect: Mood normal.         Future Appointments  Date Time Provider [place]  01/31/2020 10:20 AM Larey Dresser, MD [facility] None  02/04/2020  2:45 PM Gardiner Barefoot, DPM [facility]    BP (!) 150/84 (BP Location: Left Arm, Patient Position: Sitting, Cuff Size: Normal)   Pulse 60   Resp 16   Wt 186 lb (84.4 kg)   SpO2 92%   BMI 28.28 kg/m   Weight yesterday- 186 lb Last visit weight- 187 lb  Mr Vangieson was seen at home today and reported feeling generally well. He denied chest pain, SOB, headache, dizziness, orthopnea, fever or cough over the past week. He stated he has been compliant with his medications however he had not taken any medications prior to my  arrival today, and his weight has been stable. His medications were verified and his pillbox was refilled. I will follow up next week unless it becomes necessary sooner.   [name], EMT 11/28/19  ACTION: Home visit completed Next visit planned for 1 week

## 2019-11-29 ENCOUNTER — Telehealth (HOSPITAL_COMMUNITY): Payer: Self-pay

## 2019-11-29 NOTE — Telephone Encounter (Signed)
Received notification from [facility] that the patient is in scheduling.

## 2019-12-05 ENCOUNTER — Other Ambulatory Visit (HOSPITAL_COMMUNITY): Payer: Self-pay | Admitting: Cardiology

## 2019-12-05 ENCOUNTER — Other Ambulatory Visit (HOSPITAL_COMMUNITY): Payer: Self-pay

## 2019-12-05 ENCOUNTER — Other Ambulatory Visit: Payer: Self-pay | Admitting: Family Medicine

## 2019-12-05 NOTE — Progress Notes (Signed)
Paramedicine Encounter    Patient ID: Andrew [name], male    DOB: 06/08/1928, 85 y.o.   MRN: [id number]   Patient Care Team: [name], MD as PCP - General (Family Medicine) Larey Dresser, MD as PCP - Advanced Heart Failure (Cardiology) Larey Dresser, MD as Consulting Physician (Cardiology) Jorge Ny, LCSW as Social Worker (Licensed Clinical Social Worker) Gardiner Barefoot, DPM as Consulting Physician (Podiatry)  Patient Active Problem List   Diagnosis Date Noted  . Chronic respiratory failure with hypoxia ([place]) 09/16/2018  . Bilateral recurrent inguinal hernias 11/28/2017  . Carotid artery stenosis 01/12/2016  . Chronic systolic CHF (congestive heart failure) ([place]) 12/02/2015  . 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 ([place]) 10/16/2015  . PVD (peripheral vascular disease) ([place])   . Hereditary and idiopathic peripheral neuropathy 10/02/2015  . Peripelvic (lymphatic) cyst   . Pernicious anemia 07/15/2015  . Bilateral renal cysts 07/12/2015  . Lung nodule, 55mm RML CT 07/12/15 07/12/2015  . Stage 3 chronic kidney disease 06/14/2015  . Paroxysmal atrial fibrillation ([place]) 05/28/2015  . AAA (abdominal aortic aneurysm) without rupture ([place]) 09/30/2014  . Cardiomyopathy, ischemic 05/23/2014  . DM (diabetes mellitus), type 2 with renal complications ([place]) 96/75/9163  . Depression, major, single episode, complete remission ([place]) 03/18/2010  . Hypothyroidism 02/03/2009  . Osteoarthritis 05/27/2008  . Hyperlipidemia associated with type 2 diabetes mellitus ([place]) 06/14/2007  . Hypertension associated with diabetes ([place]) 06/14/2007  . GERD 06/14/2007    Current Outpatient Medications:  .  acetaminophen (TYLENOL) 500 MG tablet, Take 1,000 mg by mouth every 6 (six) hours as needed for mild pain or headache., Disp: , Rfl:  .  amiodarone (PACERONE) 200 MG tablet, Take 0.5 tablets (100 mg total) by mouth daily., Disp: 45 tablet, Rfl: 3 .  apixaban (ELIQUIS) 2.5 MG TABS  tablet, Take 1 tablet (2.5 mg total) by mouth 2 (two) times daily., Disp: 180 tablet, Rfl: 3 .  atorvastatin (LIPITOR) 80 MG tablet, Take 1 tablet (80 mg total) by mouth daily., Disp: 30 tablet, Rfl: 5 .  carvedilol (COREG) 12.5 MG tablet, Take 1 tablet (12.5 mg total) by mouth 2 (two) times daily., Disp: 180 tablet, Rfl: 0 .  cilostazol (PLETAL) 100 MG tablet, Take 1 tablet (100 mg total) by mouth 2 (two) times daily., Disp: 180 tablet, Rfl: 3 .  ezetimibe (ZETIA) 10 MG tablet, TAKE (1) TABLET DAILY., Disp: 90 tablet, Rfl: 0 .  FLUoxetine (PROZAC) 20 MG capsule, TAKE (1) CAPSULE DAILY., Disp: 90 capsule, Rfl: 0 .  furosemide (LASIX) 80 MG tablet, TAKE 1&1/2 TABLETS IN THE MORNING AND 1 TABLET IN THE AFTERNOON., Disp: 225 tablet, Rfl: 6 .  gabapentin (NEURONTIN) 100 MG capsule, Take 100 mg by mouth 3 (three) times daily., Disp: , Rfl:  .  hydrALAZINE (APRESOLINE) 50 MG tablet, Take 1 tablet (50 mg total) by mouth 3 (three) times daily., Disp: 270 tablet, Rfl: 3 .  HYDROcodone-acetaminophen (NORCO/VICODIN) 5-325 MG tablet, Take 1 tablet by mouth daily as needed for moderate pain. , Disp: , Rfl:  .  isosorbide mononitrate (IMDUR) 30 MG 24 hr tablet, TAKE 1 TABLET EACH DAY., Disp: 90 tablet, Rfl: 3 .  levothyroxine (SYNTHROID) 100 MCG tablet, Take 1 tablet (100 mcg total) by mouth daily before breakfast., Disp: 90 tablet, Rfl: 1 .  Multiple Vitamin (MULTIVITAMIN) tablet, Take 1 tablet by mouth daily., Disp: , Rfl:  .  nitroGLYCERIN (NITROSTAT) 0.4 MG SL tablet, DISSOLVE 1 TABLET UNDER TONGUE AS NEEDED  FOR CHEST PAIN,MAY REPEAT IN5 MINUTES FOR 2 DOSES., Disp: 25 tablet, Rfl: 3 .  potassium chloride SA (K-DUR) 20 MEQ tablet, Take 1 tablet (20 mEq total) by mouth daily., Disp: 90 tablet, Rfl: 1 .  SPIRIVA HANDIHALER 18 MCG inhalation capsule, PLACE 1 CAPSULE INTO INHALER AND INHALE ONCE DAILY. (Patient taking differently: Place 18 mcg into inhaler and inhale daily. ), Disp: 90 capsule, Rfl: 0 .  temazepam  (RESTORIL) 30 MG capsule, TAKE 1 CAPSULE AT BEDTIME AS NEEDED FOR SLEEP., Disp: 30 capsule, Rfl: 0 .  glucose blood (ACCU-CHEK AVIVA PLUS) test strip, CHECK BLOOD SUGAR 4 TIMES A DAY., Disp: 100 each, Rfl: 11 .  Influenza Vac A&B Surf Ant Adj (FLUAD) 0.5 ML SUSY, , Disp: , Rfl:  .  Lancets MISC, Check blood sugar 4 times a day, Disp: 100 each, Rfl: 11 .  oxyCODONE (OXY IR/ROXICODONE) 5 MG immediate release tablet, oxycodone 5 mg tablet, Disp: , Rfl:  .  traMADol (ULTRAM) 50 MG tablet, Take 1 tablet (50 mg total) by mouth every 6 (six) hours as needed., Disp: 12 tablet, Rfl: 0 Allergies  Allergen Reactions  . Metoprolol Shortness Of Breath, Palpitations and Other (See Comments)    Heart starts racing. Shallow breathing; pt states he also get skin irritation on his legs  . Metformin Hives and Swelling    On legs  . Metformin And Related Nausea And Vomiting      Social History   Socioeconomic History  . Marital status: Married    Spouse name: Not on file  . Number of children: Not on file  . Years of education: Not on file  . Highest education level: Not on file  Occupational History  . Occupation: Retired  Tobacco Use  . Smoking status: Former Smoker    Packs/day: 1.50    Years: 35.00    Pack years: 52.50    Types: Cigarettes  . Smokeless tobacco: Never Used  . Tobacco comment: quit atleast 25 yrs ago, per pt  Substance and Sexual Activity  . Alcohol use: Yes    Alcohol/week: 3.0 standard drinks    Types: 3 Shots of liquor per week    Comment: socially  . Drug use: No  . Sexual activity: Yes  Other Topics Concern  . Not on file  Social History Narrative   Pt lives in [place] with spouse.   Retired from [employer].  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   Social Determinants of Health   Financial Resource Strain:   . Difficulty of Paying Living Expenses: Not on file  Food Insecurity:   . Worried About Sales executive in the Last Year: Not on file  . Ran Out of Food in the Last Year: Not on file  Transportation Needs:   . Lack of Transportation (Medical): Not on file  . Lack of Transportation (Non-Medical): Not on file  Physical Activity:   . Days of Exercise per Week: Not on file  . Minutes of Exercise per Session: Not on file  Stress:   . Feeling of Stress : Not on file  Social Connections:   . Frequency of Communication with Friends and Family: Not on file  . Frequency of Social Gatherings with Friends and Family: Not on file  . Attends Religious Services: Not on file  . Active Member of Clubs or Organizations: Not on file  . Attends Archivist Meetings: Not on file  . Marital  Status: Not on file  Intimate Partner Violence:   . Fear of Current or Ex-Partner: Not on file  . Emotionally Abused: Not on file  . Physically Abused: Not on file  . Sexually Abused: Not on file    Physical Exam Cardiovascular:     Rate and Rhythm: Normal rate and regular rhythm.     Pulses: Normal pulses.  Pulmonary:     Effort: Pulmonary effort is normal.     Breath sounds: Normal breath sounds.  Musculoskeletal:        General: Normal range of motion.     Right lower leg: No edema.     Left lower leg: No edema.  Skin:    General: Skin is warm and dry.     Capillary Refill: Capillary refill takes less than 2 seconds.  Neurological:     Mental Status: He is alert and oriented to person, place, and time.  Psychiatric:        Mood and Affect: Mood normal.         Future Appointments  Date Time Provider [place]  01/31/2020 10:20 AM Larey Dresser, MD [facility] None  02/04/2020  2:45 PM Gardiner Barefoot, DPM [facility]    BP 120/64 (BP Location: Left Arm, Patient Position: Sitting, Cuff Size: Normal)   Pulse 84   Resp 16   Wt 186 lb (84.4 kg)   SpO2 92%   BMI 28.28 kg/m   Weight yesterday- 186 lb Last visit weight- 192 lb  Mr Conaway was seen at home today and  reported feeling well. He denied chest pain, SOB, headache, dizziness, orthopnea, fever or cough since our last visit. He stated he has been compliant with his medications over the past week and his weight has been stable. His medications were verified and his pillbox was refilled. All necessary refills were ordered. (6 in total). I will follow up next week.   [name], EMT 12/05/19  ACTION: Home visit completed Next visit planned for 1 week

## 2019-12-11 ENCOUNTER — Telehealth (HOSPITAL_COMMUNITY): Payer: Self-pay

## 2019-12-11 NOTE — Telephone Encounter (Signed)
LMOM asking patient to give the office a call to find out about his sleep study.

## 2019-12-12 ENCOUNTER — Other Ambulatory Visit (HOSPITAL_COMMUNITY): Payer: Self-pay

## 2019-12-12 NOTE — Progress Notes (Signed)
Paramedicine Encounter    Patient ID: Andrew [name], male    DOB: 23-Aug-1928, 85 y.o.   MRN: [id number]   Patient Care Team: [name], MD as PCP - General (Family Medicine) Larey Dresser, MD as PCP - Advanced Heart Failure (Cardiology) Larey Dresser, MD as Consulting Physician (Cardiology) Jorge Ny, LCSW as Social Worker (Licensed Clinical Social Worker) Gardiner Barefoot, DPM as Consulting Physician (Podiatry)  Patient Active Problem List   Diagnosis Date Noted  . Chronic respiratory failure with hypoxia ([place]) 09/16/2018  . Bilateral recurrent inguinal hernias 11/28/2017  . Carotid artery stenosis 01/12/2016  . Chronic systolic CHF (congestive heart failure) ([place]) 12/02/2015  . 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 PVD (peripheral vascular disease) ([place])   . Hereditary and idiopathic peripheral neuropathy 10/02/2015  . Peripelvic (lymphatic) cyst   . Pernicious anemia 07/15/2015  . Bilateral renal cysts 07/12/2015  . Lung nodule, 88mm RML CT 07/12/15 07/12/2015  . Stage 3 chronic kidney disease 06/14/2015  . Paroxysmal atrial fibrillation ([place]) 05/28/2015  . AAA (abdominal aortic aneurysm) without rupture ([place]) 09/30/2014  . Cardiomyopathy, ischemic 05/23/2014  . DM (diabetes mellitus), type 2 with renal complications ([place]) 73/42/8768  . Depression, major, single episode, complete remission ([place]) 03/18/2010  . Hypothyroidism 02/03/2009  . Osteoarthritis 05/27/2008  . Hyperlipidemia associated with type 2 diabetes mellitus ([place]) 06/14/2007  . Hypertension associated with diabetes ([place]) 06/14/2007  . GERD 06/14/2007    Current Outpatient Medications:  .  acetaminophen (TYLENOL) 500 MG tablet, Take 1,000 mg by mouth every 6 (six) hours as needed for mild pain or headache., Disp: , Rfl:  .  amiodarone (PACERONE) 200 MG tablet, Take 0.5 tablets (100 mg total) by mouth daily., Disp: 45 tablet, Rfl: 3 .  apixaban (ELIQUIS) 2.5 MG TABS  tablet, Take 1 tablet (2.5 mg total) by mouth 2 (two) times daily., Disp: 180 tablet, Rfl: 3 .  atorvastatin (LIPITOR) 80 MG tablet, Take 1 tablet (80 mg total) by mouth daily., Disp: 30 tablet, Rfl: 5 .  carvedilol (COREG) 12.5 MG tablet, Take 1 tablet (12.5 mg total) by mouth 2 (two) times daily., Disp: 180 tablet, Rfl: 0 .  cilostazol (PLETAL) 100 MG tablet, Take 1 tablet (100 mg total) by mouth 2 (two) times daily., Disp: 180 tablet, Rfl: 3 .  ezetimibe (ZETIA) 10 MG tablet, TAKE (1) TABLET DAILY., Disp: 90 tablet, Rfl: 0 .  FLUoxetine (PROZAC) 20 MG capsule, TAKE (1) CAPSULE DAILY., Disp: 90 capsule, Rfl: 0 .  furosemide (LASIX) 80 MG tablet, TAKE 1&1/2 TABLETS IN THE MORNING AND 1 TABLET IN THE AFTERNOON., Disp: 225 tablet, Rfl: 6 .  gabapentin (NEURONTIN) 100 MG capsule, Take 100 mg by mouth 3 (three) times daily., Disp: , Rfl:  .  hydrALAZINE (APRESOLINE) 50 MG tablet, Take 1 tablet (50 mg total) by mouth 3 (three) times daily., Disp: 270 tablet, Rfl: 3 .  HYDROcodone-acetaminophen (NORCO/VICODIN) 5-325 MG tablet, Take 1 tablet by mouth daily as needed for moderate pain. , Disp: , Rfl:  .  isosorbide mononitrate (IMDUR) 30 MG 24 hr tablet, TAKE 1 TABLET EACH DAY., Disp: 90 tablet, Rfl: 3 .  levothyroxine (SYNTHROID) 100 MCG tablet, Take 1 tablet (100 mcg total) by mouth daily before breakfast., Disp: 90 tablet, Rfl: 1 .  Multiple Vitamin (MULTIVITAMIN) tablet, Take 1 tablet by mouth daily., Disp: , Rfl:  .  potassium chloride SA (KLOR-CON) 20 MEQ tablet, TAKE 1 TABLET ONCE DAILY., Disp:  90 tablet, Rfl: 3 .  temazepam (RESTORIL) 30 MG capsule, TAKE 1 CAPSULE AT BEDTIME AS NEEDED FOR SLEEP., Disp: 30 capsule, Rfl: 0 .  glucose blood (ACCU-CHEK AVIVA PLUS) test strip, CHECK BLOOD SUGAR 4 TIMES A DAY., Disp: 100 each, Rfl: 11 .  Influenza Vac A&B Surf Ant Adj (FLUAD) 0.5 ML SUSY, , Disp: , Rfl:  .  Lancets MISC, Check blood sugar 4 times a day, Disp: 100 each, Rfl: 11 .  nitroGLYCERIN  (NITROSTAT) 0.4 MG SL tablet, DISSOLVE 1 TABLET UNDER TONGUE AS NEEDED FOR CHEST PAIN,MAY REPEAT IN5 MINUTES FOR 2 DOSES., Disp: 25 tablet, Rfl: 3 .  oxyCODONE (OXY IR/ROXICODONE) 5 MG immediate release tablet, oxycodone 5 mg tablet, Disp: , Rfl:  .  SPIRIVA HANDIHALER 18 MCG inhalation capsule, PLACE 1 CAPSULE INTO INHALER AND INHALE ONCE DAILY. (Patient taking differently: Place 18 mcg into inhaler and inhale daily. ), Disp: 90 capsule, Rfl: 0 .  traMADol (ULTRAM) 50 MG tablet, Take 1 tablet (50 mg total) by mouth every 6 (six) hours as needed. (Patient not taking: Reported on 12/12/2019), Disp: 12 tablet, Rfl: 0 Allergies  Allergen Reactions  . Metoprolol Shortness Of Breath, Palpitations and Other (See Comments)    Heart starts racing. Shallow breathing; pt states he also get skin irritation on his legs  . Metformin Hives and Swelling    On legs  . Metformin And Related Nausea And Vomiting      Social History   Socioeconomic History  . Marital status: Married    Spouse name: Not on file  . Number of children: Not on file  . Years of education: Not on file  . Highest education level: Not on file  Occupational History  . Occupation: Retired  Tobacco Use  . Smoking status: Former Smoker    Packs/day: 1.50    Years: 35.00    Pack years: 52.50    Types: Cigarettes  . Smokeless tobacco: Never Used  . Tobacco comment: quit atleast 25 yrs ago, per pt  Substance and Sexual Activity  . Alcohol use: Yes    Alcohol/week: 3.0 standard drinks    Types: 3 Shots of liquor per week    Comment: socially  . Drug use: No  . Sexual activity: Yes  Other Topics Concern  . Not on file  Social History Narrative   Pt lives in [place] with spouse.   Retired from [employer].  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   Social Determinants of Health   Financial Resource Strain:   . Difficulty of Paying Living Expenses: Not on file  Food  Insecurity:   . Worried About Charity fundraiser in the Last Year: Not on file  . Ran Out of Food in the Last Year: Not on file  Transportation Needs:   . Lack of Transportation (Medical): Not on file  . Lack of Transportation (Non-Medical): Not on file  Physical Activity:   . Days of Exercise per Week: Not on file  . Minutes of Exercise per Session: Not on file  Stress:   . Feeling of Stress : Not on file  Social Connections:   . Frequency of Communication with Friends and Family: Not on file  . Frequency of Social Gatherings with Friends and Family: Not on file  . Attends Religious Services: Not on file  . Active Member of Clubs or Organizations: Not on file  . Attends Archivist Meetings: Not on file  .  Marital Status: Not on file  Intimate Partner Violence:   . Fear of Current or Ex-Partner: Not on file  . Emotionally Abused: Not on file  . Physically Abused: Not on file  . Sexually Abused: Not on file    Physical Exam Cardiovascular:     Rate and Rhythm: Normal rate and regular rhythm.     Pulses: Normal pulses.  Pulmonary:     Effort: Pulmonary effort is normal.     Breath sounds: Normal breath sounds.  Musculoskeletal:        General: Normal range of motion.     Right lower leg: No edema.     Left lower leg: No edema.  Skin:    General: Skin is warm and dry.     Capillary Refill: Capillary refill takes less than 2 seconds.  Neurological:     Mental Status: He is alert and oriented to person, place, and time.  Psychiatric:        Mood and Affect: Mood normal.         Future Appointments  Date Time Provider [place]  01/31/2020 10:20 AM Larey Dresser, MD [facility] None  02/04/2020  2:45 PM Gardiner Barefoot, DPM [facility]    BP 126/64 (BP Location: Left Arm, Patient Position: Sitting, Cuff Size: Normal)   Pulse 72   Resp 16   Wt 187 lb (84.8 kg)   SpO2 92%   BMI 28.43 kg/m   Weight yesterday- did not weigh Last visit  weight- 186 lb   Mr Caughlin was seen at home today and reported feeling generally well. He denied chest pain, SOB, headache, dizziness, orthopnea, fever or cough since our last visit. He did complain of pain to inside of his left cheek and jaw but said it only occurs when he touched it with his tongue or touches his face. He stated this has been going on for the past two to three days and there is some minimal swelling. I advised he reach out to his dentist and primary care provider to ensure he had not developed an abscess. He was understanding and agreeable. His medications were verified and his pillbox was refilled. I will follow up in two weeks.   [name], EMT 12/12/19  ACTION: Home visit completed Next visit planned for 2 weeks

## 2019-12-13 ENCOUNTER — Ambulatory Visit (INDEPENDENT_AMBULATORY_CARE_PROVIDER_SITE_OTHER): Payer: Medicare Other | Admitting: Physician Assistant

## 2019-12-13 ENCOUNTER — Encounter: Payer: Self-pay | Admitting: Physician Assistant

## 2019-12-13 ENCOUNTER — Other Ambulatory Visit: Payer: Self-pay

## 2019-12-13 VITALS — BP 110/56 | HR 81 | Temp 98.0°F | Ht 68.0 in | Wt 192.5 lb

## 2019-12-13 DIAGNOSIS — R6884 Jaw pain: Secondary | ICD-10-CM | POA: Diagnosis not present

## 2019-12-13 LAB — CBC WITH DIFFERENTIAL/PLATELET
Basophils Absolute: 0 10*3/uL (ref 0.0–0.1)
Basophils Relative: 0.3 % (ref 0.0–3.0)
Eosinophils Absolute: 0.2 10*3/uL (ref 0.0–0.7)
Eosinophils Relative: 1.8 % (ref 0.0–5.0)
HCT: 38 % — ABNORMAL LOW (ref 39.0–52.0)
Hemoglobin: 12.9 g/dL — ABNORMAL LOW (ref 13.0–17.0)
Lymphocytes Relative: 19.9 % (ref 12.0–46.0)
Lymphs Abs: 1.8 10*3/uL (ref 0.7–4.0)
MCHC: 33.9 g/dL (ref 30.0–36.0)
MCV: 96.9 fl (ref 78.0–100.0)
Monocytes Absolute: 0.8 10*3/uL (ref 0.1–1.0)
Monocytes Relative: 8.5 % (ref 3.0–12.0)
Neutro Abs: 6.4 10*3/uL (ref 1.4–7.7)
Neutrophils Relative %: 69.5 % (ref 43.0–77.0)
Platelets: 142 10*3/uL — ABNORMAL LOW (ref 150.0–400.0)
RBC: 3.92 Mil/uL — ABNORMAL LOW (ref 4.22–5.81)
RDW: 14.1 % (ref 11.5–15.5)
WBC: 9.2 10*3/uL (ref 4.0–10.5)

## 2019-12-13 LAB — COMPREHENSIVE METABOLIC PANEL
ALT: 58 U/L — ABNORMAL HIGH (ref 0–53)
AST: 44 U/L — ABNORMAL HIGH (ref 0–37)
Albumin: 3.9 g/dL (ref 3.5–5.2)
Alkaline Phosphatase: 59 U/L (ref 39–117)
BUN: 50 mg/dL — ABNORMAL HIGH (ref 6–23)
CO2: 30 mEq/L (ref 19–32)
Calcium: 9.2 mg/dL (ref 8.4–10.5)
Chloride: 101 mEq/L (ref 96–112)
Creatinine, Ser: 2.05 mg/dL — ABNORMAL HIGH (ref 0.40–1.50)
GFR: 30.71 mL/min — ABNORMAL LOW (ref >60.00)
Glucose, Bld: 194 mg/dL — ABNORMAL HIGH (ref 70–99)
Potassium: 3.9 mEq/L (ref 3.5–5.1)
Sodium: 140 mEq/L (ref 135–145)
Total Bilirubin: 0.7 mg/dL (ref 0.2–1.2)
Total Protein: 6.3 g/dL (ref 6.0–8.3)

## 2019-12-13 MED ORDER — TRAMADOL HCL 50 MG PO TABS: 50.0000 mg | ORAL_TABLET | Freq: Four times a day (QID) | ORAL | 0 refills | Status: DC | PRN

## 2019-12-13 MED ORDER — AMOXICILLIN-POT CLAVULANATE 875-125 MG PO TABS: 1.0000 | ORAL_TABLET | Freq: Two times a day (BID) | ORAL | 0 refills | Status: DC

## 2019-12-13 NOTE — Patient Instructions (Signed)
It was great to see you!  STOP amoxicillin. START augmentin.  May trial HALF a tablet of tramadol for severe pain.  Contact a health care provider if:  You have a fever or chills.  You have new symptoms.  Your symptoms get worse.  Your symptoms do not improve with treatment. Get help right away if:  You have difficulty breathing or swallowing because of the swollen gland.  Take care,  Inda Coke PA-C

## 2019-12-13 NOTE — Progress Notes (Signed)
Andrew [name] is a 85 y.o. male here for a new problem.  I acted as a Education administrator for [employer], PA-C Anselmo Pickler, LPN  History of Present Illness:   Chief Complaint  Patient presents with  . Neck Pain    HPI   L Jaw Pain Pt is c/o pain in Lt jaw and neck x 1-2 weeks. Pt was started on Amoxicillin 500 mg BID by dentist yesterday and was told not related to teeth. Taking tylenol and ASA for pain without relief of symptoms. Has oxy, norco on tramdol on rx list but states that they are all old rx (confirmed this in PDMP, no narcotics have been filled in >12 months.)  Denies trauma, ear pain, drainage from ear, difficulty swallowing, stridor, fever, chills, malaise  Pain is 9/10 and worsening with time. Area is very tender to touch and is starting to get tender without touching.  Past Medical History:  Diagnosis Date  . AAA (abdominal aortic aneurysm) ([place])   . Anemia   . Arthritis   . CAD (coronary artery disease)   . Cancer ([place])    Melanoma - Back  . Carotid artery stenosis   . CHF (congestive heart failure) ([place])   . Cyst of kidney, acquired   . Depression   . Diabetes mellitus   . DVT (deep venous thrombosis) ([place])   . Dysrhythmia   . Fatty liver 2008  . GERD (gastroesophageal reflux disease)   . Hyperlipidemia   . Hypertension   . IBS (irritable bowel syndrome)   . LVF (left ventricular failure) ([place])   . Paroxysmal atrial fibrillation ([facility])   . Pneumonia Jan. 2014  . Shortness of breath dyspnea   . Thyroid disease   . Typical atrial flutter ([place])   . UTI (urinary tract infection) 05/2015   While in [place]   . Vitamin D deficiency      Social History   Socioeconomic History  . Marital status: Married    Spouse name: Not on file  . Number of children: Not on file  . Years of education: Not on file  . Highest education level: Not on file  Occupational History  . Occupation: Retired  Tobacco Use  . Smoking status: Former Smoker    Packs/day: 1.50     Years: 35.00    Pack years: 52.50    Types: Cigarettes  . Smokeless tobacco: Never Used  . Tobacco comment: quit atleast 25 yrs ago, per pt  Substance and Sexual Activity  . Alcohol use: Yes    Alcohol/week: 3.0 standard drinks    Types: 3 Shots of liquor per week    Comment: socially  . Drug use: No  . Sexual activity: Yes  Other Topics Concern  . Not on file  Social History Narrative   Pt lives in [place] with spouse.   Retired from [employer].  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   Social Determinants of Health   Financial Resource Strain:   . Difficulty of Paying Living Expenses: Not on file  Food Insecurity:   . Worried About Charity fundraiser in the Last Year: Not on file  . Ran Out of Food in the Last Year: Not on file  Transportation Needs:   . Lack of Transportation (Medical): Not on file  . Lack of Transportation (Non-Medical): Not on file  Physical Activity:   . Days of Exercise per Week: Not on file  . Minutes of  Exercise per Session: Not on file  Stress:   . Feeling of Stress : Not on file  Social Connections:   . Frequency of Communication with Friends and Family: Not on file  . Frequency of Social Gatherings with Friends and Family: Not on file  . Attends Religious Services: Not on file  . Active Member of Clubs or Organizations: Not on file  . Attends Archivist Meetings: Not on file  . Marital Status: Not on file  Intimate Partner Violence:   . Fear of Current or Ex-Partner: Not on file  . Emotionally Abused: Not on file  . Physically Abused: Not on file  . Sexually Abused: Not on file    Past Surgical History:  Procedure Laterality Date  . ABDOMINAL AORTIC ANEURYSM REPAIR  2002  . CARDIAC CATHETERIZATION    . CORONARY ANGIOPLASTY    . CORONARY ANGIOPLASTY WITH STENT PLACEMENT  2005  . CORONARY ARTERY BYPASS GRAFT  1992  . EYE SURGERY     [place]  . HERNIA REPAIR    .  INGUINAL HERNIA REPAIR Bilateral    w/mesh  . INSERTION OF MESH N/A 11/28/2017   Procedure: INSERTION OF MESH;  Surgeon: Donnie Mesa, MD;  Location: [facility];  Service: General;  Laterality: N/A;  GENERAL AND TAP BLOCK  . KNEE SURGERY Bilateral   . LAPAROSCOPIC INGUINAL HERNIA WITH UMBILICAL HERNIA Bilateral 11/28/2017   Procedure: LAPAROSCOPIC BILATERAL INGUINAL HERNIA REPAIR WITH MESH, UMBILICAL HERNIA REPAIR WITH MESH;  Surgeon: Donnie Mesa, MD;  Location: [facility];  Service: General;  Laterality: Bilateral;  GENERAL AND TAP BLOCK  . LEFT HEART CATHETERIZATION WITH CORONARY ANGIOGRAM N/A 10/30/2014   Procedure: LEFT HEART CATHETERIZATION WITH CORONARY ANGIOGRAM;  Surgeon: Larey Dresser, MD;  Location: [facility] CATH LAB;  Service: Cardiovascular;  Laterality: N/A;  . QUADRICEPS TENDON REPAIR Bilateral 08/2003   Archie Endo 05/10/2011  . UMBILICAL HERNIA REPAIR  11/28/2017   w/mesh    Family History  Problem Relation Age of Onset  . Colon cancer Father   . Coronary artery disease Brother   . Diabetes Brother   . Heart disease Brother   . Hyperlipidemia Brother   . Peripheral vascular disease Brother        Varicose Veins  . Throat cancer Brother        abdominal cancer?   . Diabetes Son   . Hyperlipidemia Son     Allergies  Allergen Reactions  . Metoprolol Shortness Of Breath, Palpitations and Other (See Comments)    Heart starts racing. Shallow breathing; pt states he also get skin irritation on his legs  . Metformin Hives and Swelling    On legs  . Metformin And Related Nausea And Vomiting    Current Medications:   Current Outpatient Medications:  .  acetaminophen (TYLENOL) 500 MG tablet, Take 1,000 mg by mouth every 6 (six) hours as needed for mild pain or headache., Disp: , Rfl:  .  amiodarone (PACERONE) 200 MG tablet, Take 0.5 tablets (100 mg total) by mouth daily., Disp: 45 tablet, Rfl: 3 .  apixaban (ELIQUIS) 2.5 MG TABS tablet, Take 1 tablet (2.5 mg total) by mouth 2 (two)  times daily., Disp: 180 tablet, Rfl: 3 .  atorvastatin (LIPITOR) 80 MG tablet, Take 1 tablet (80 mg total) by mouth daily., Disp: 30 tablet, Rfl: 5 .  carvedilol (COREG) 12.5 MG tablet, Take 1 tablet (12.5 mg total) by mouth 2 (two) times daily., Disp: 180 tablet, Rfl: 0 .  cilostazol (PLETAL) 100 MG tablet, Take 1 tablet (100 mg total) by mouth 2 (two) times daily., Disp: 180 tablet, Rfl: 3 .  ezetimibe (ZETIA) 10 MG tablet, TAKE (1) TABLET DAILY., Disp: 90 tablet, Rfl: 0 .  FLUoxetine (PROZAC) 20 MG capsule, TAKE (1) CAPSULE DAILY., Disp: 90 capsule, Rfl: 0 .  furosemide (LASIX) 80 MG tablet, TAKE 1&1/2 TABLETS IN THE MORNING AND 1 TABLET IN THE AFTERNOON., Disp: 225 tablet, Rfl: 6 .  gabapentin (NEURONTIN) 100 MG capsule, Take 100 mg by mouth 3 (three) times daily., Disp: , Rfl:  .  glucose blood (ACCU-CHEK AVIVA PLUS) test strip, CHECK BLOOD SUGAR 4 TIMES A DAY., Disp: 100 each, Rfl: 11 .  hydrALAZINE (APRESOLINE) 50 MG tablet, Take 1 tablet (50 mg total) by mouth 3 (three) times daily., Disp: 270 tablet, Rfl: 3 .  Influenza Vac A&B Surf Ant Adj (FLUAD) 0.5 ML SUSY, , Disp: , Rfl:  .  isosorbide mononitrate (IMDUR) 30 MG 24 hr tablet, TAKE 1 TABLET EACH DAY., Disp: 90 tablet, Rfl: 3 .  Lancets MISC, Check blood sugar 4 times a day, Disp: 100 each, Rfl: 11 .  levothyroxine (SYNTHROID) 100 MCG tablet, Take 1 tablet (100 mcg total) by mouth daily before breakfast., Disp: 90 tablet, Rfl: 1 .  Multiple Vitamin (MULTIVITAMIN) tablet, Take 1 tablet by mouth daily., Disp: , Rfl:  .  nitroGLYCERIN (NITROSTAT) 0.4 MG SL tablet, DISSOLVE 1 TABLET UNDER TONGUE AS NEEDED FOR CHEST PAIN,MAY REPEAT IN5 MINUTES FOR 2 DOSES., Disp: 25 tablet, Rfl: 3 .  potassium chloride SA (KLOR-CON) 20 MEQ tablet, TAKE 1 TABLET ONCE DAILY., Disp: 90 tablet, Rfl: 3 .  SPIRIVA HANDIHALER 18 MCG inhalation capsule, PLACE 1 CAPSULE INTO INHALER AND INHALE ONCE DAILY. (Patient taking differently: Place 18 mcg into inhaler and  inhale daily. ), Disp: 90 capsule, Rfl: 0 .  temazepam (RESTORIL) 30 MG capsule, TAKE 1 CAPSULE AT BEDTIME AS NEEDED FOR SLEEP., Disp: 30 capsule, Rfl: 0 .  amoxicillin-clavulanate (AUGMENTIN) 875-125 MG tablet, Take 1 tablet by mouth 2 (two) times daily., Disp: 20 tablet, Rfl: 0 .  traMADol (ULTRAM) 50 MG tablet, Take 1 tablet (50 mg total) by mouth every 6 (six) hours as needed for moderate pain or severe pain., Disp: 20 tablet, Rfl: 0   Review of Systems:   ROS  Negative unless otherwise specified per HPI.   Vitals:   Vitals:   12/13/19 1309  BP: (!) 110/56  Pulse: 81  Temp: 98 F (36.7 C)  TempSrc: Temporal  SpO2: 93%  Weight: 192 lb 8 oz (87.3 kg)  Height: 5\' 8"  (1.727 m)     Body mass index is 29.27 kg/m.  Physical Exam:   Physical Exam Vitals and nursing note reviewed.  Constitutional:      General: He is not in acute distress.    Appearance: He is well-developed. He is not ill-appearing or toxic-appearing.  HENT:     Ears:     Comments: Hearing aid in L ear -- did not eval    Mouth/Throat:     Lips: Pink.     Mouth: Mucous membranes are moist.     Pharynx: Oropharynx is clear.     Tonsils: 0 on the right. 0 on the left.  Cardiovascular:     Rate and Rhythm: Normal rate and regular rhythm.     Pulses: Normal pulses.     Heart sounds: Normal heart sounds, S1 normal and S2 normal.     Comments:  No LE edema Pulmonary:     Effort: Pulmonary effort is normal.     Breath sounds: Normal breath sounds.  Musculoskeletal:     Comments: L jaw TTP, no obvious swelling/erythema to L jaw  Lymphadenopathy:     Comments: Cervical lymphadenopathy on L side  Skin:    General: Skin is warm and dry.  Neurological:     Mental Status: He is alert.     GCS: GCS eye subscore is 4. GCS verbal subscore is 5. GCS motor subscore is 6.  Psychiatric:        Speech: Speech normal.        Behavior: Behavior normal. Behavior is cooperative.      Assessment and Plan:   Abhay  was seen today for neck pain.  Diagnoses and all orders for this visit:  Jaw pain Unclear etiology of pain. No red flags on exam and patient is in no acute distress at this time. He doesn't seem to be responding to amoxicillin, will escalate antibiotic coverage to Augmentin. Patient also evaluated by Dr. Dimas Chyle who is in agreement to plan. Will trial 1/2 tablet of tramadol for pain, sleepy precautions advised. Worsening precautions advised as well and provided in AVS -- including but not limited to, development of fever, worsening swelling. Will also check CBC and CMP to check for infection. Consider further LAD work-up if symptoms persist > 2 weeks. -     CBC with Differential/Platelet -     Comprehensive metabolic panel  Other orders -     traMADol (ULTRAM) 50 MG tablet; Take 1 tablet (50 mg total) by mouth every 6 (six) hours as needed for moderate pain or severe pain. -     amoxicillin-clavulanate (AUGMENTIN) 875-125 MG tablet; Take 1 tablet by mouth 2 (two) times daily.    . Reviewed expectations re: course of current medical issues. . Discussed self-management of symptoms. . Outlined signs and symptoms indicating need for more acute intervention. . Patient verbalized understanding and all questions were answered. . See orders for this visit as documented in the electronic medical record. . Patient received an After-Visit Summary.  CMA or LPN served as scribe during this visit. History, Physical, and Plan performed by medical provider. The above documentation has been reviewed and is accurate and complete.   Inda Coke, PA-C

## 2019-12-26 ENCOUNTER — Other Ambulatory Visit (HOSPITAL_COMMUNITY): Payer: Self-pay

## 2019-12-26 NOTE — Progress Notes (Signed)
Paramedicine Encounter    Patient ID: Andrew [name], male    DOB: [birth year], 85 y.o.   MRN: [id number]   Patient Care Team: [name], MD as PCP - General (Family Medicine) Larey Dresser, MD as PCP - Advanced Heart Failure (Cardiology) Larey Dresser, MD as Consulting Physician (Cardiology) Jorge Ny, LCSW as Social Worker (Licensed Clinical Social Worker) Gardiner Barefoot, DPM as Consulting Physician (Podiatry)  Patient Active Problem List   Diagnosis Date Noted  . Chronic respiratory failure with hypoxia ([place]) 09/16/2018  . Bilateral recurrent inguinal hernias 11/28/2017  . Carotid artery stenosis 01/12/2016  . Chronic systolic CHF (congestive heart failure) ([place]) 12/02/2015  . 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 PVD (peripheral vascular disease) ([place])   . Hereditary and idiopathic peripheral neuropathy 10/02/2015  . Peripelvic (lymphatic) cyst   . Pernicious anemia 07/15/2015  . Bilateral renal cysts 07/12/2015  . Lung nodule, 30mm RML CT 07/12/15 07/12/2015  . Stage 3 chronic kidney disease 06/14/2015  . Paroxysmal atrial fibrillation ([place]) 05/28/2015  . AAA (abdominal aortic aneurysm) without rupture ([place]) 09/30/2014  . Cardiomyopathy, ischemic 05/23/2014  . DM (diabetes mellitus), type 2 with renal complications ([place]) 82/95/6213  . Depression, major, single episode, complete remission ([place]) 03/18/2010  . Hypothyroidism 02/03/2009  . Osteoarthritis 05/27/2008  . Hyperlipidemia associated with type 2 diabetes mellitus ([place]) 06/14/2007  . Hypertension associated with diabetes ([place]) 06/14/2007  . GERD 06/14/2007    Current Outpatient Medications:  .  acetaminophen (TYLENOL) 500 MG tablet, Take 1,000 mg by mouth every 6 (six) hours as needed for mild pain or headache., Disp: , Rfl:  .  amiodarone (PACERONE) 200 MG tablet, Take 0.5 tablets (100 mg total) by mouth daily., Disp: 45 tablet, Rfl: 3 .  apixaban (ELIQUIS) 2.5 MG TABS  tablet, Take 1 tablet (2.5 mg total) by mouth 2 (two) times daily., Disp: 180 tablet, Rfl: 3 .  atorvastatin (LIPITOR) 80 MG tablet, Take 1 tablet (80 mg total) by mouth daily., Disp: 30 tablet, Rfl: 5 .  carvedilol (COREG) 12.5 MG tablet, Take 1 tablet (12.5 mg total) by mouth 2 (two) times daily., Disp: 180 tablet, Rfl: 0 .  cilostazol (PLETAL) 100 MG tablet, Take 1 tablet (100 mg total) by mouth 2 (two) times daily., Disp: 180 tablet, Rfl: 3 .  ezetimibe (ZETIA) 10 MG tablet, TAKE (1) TABLET DAILY., Disp: 90 tablet, Rfl: 0 .  FLUoxetine (PROZAC) 20 MG capsule, TAKE (1) CAPSULE DAILY., Disp: 90 capsule, Rfl: 0 .  furosemide (LASIX) 80 MG tablet, TAKE 1&1/2 TABLETS IN THE MORNING AND 1 TABLET IN THE AFTERNOON., Disp: 225 tablet, Rfl: 6 .  gabapentin (NEURONTIN) 100 MG capsule, Take 100 mg by mouth 3 (three) times daily., Disp: , Rfl:  .  hydrALAZINE (APRESOLINE) 50 MG tablet, Take 1 tablet (50 mg total) by mouth 3 (three) times daily., Disp: 270 tablet, Rfl: 3 .  isosorbide mononitrate (IMDUR) 30 MG 24 hr tablet, TAKE 1 TABLET EACH DAY., Disp: 90 tablet, Rfl: 3 .  levothyroxine (SYNTHROID) 100 MCG tablet, Take 1 tablet (100 mcg total) by mouth daily before breakfast., Disp: 90 tablet, Rfl: 1 .  Multiple Vitamin (MULTIVITAMIN) tablet, Take 1 tablet by mouth daily., Disp: , Rfl:  .  potassium chloride SA (KLOR-CON) 20 MEQ tablet, TAKE 1 TABLET ONCE DAILY., Disp: 90 tablet, Rfl: 3 .  SPIRIVA HANDIHALER 18 MCG inhalation capsule, PLACE 1 CAPSULE INTO INHALER AND INHALE ONCE DAILY. (Patient taking  differently: Place 18 mcg into inhaler and inhale daily. ), Disp: 90 capsule, Rfl: 0 .  temazepam (RESTORIL) 30 MG capsule, TAKE 1 CAPSULE AT BEDTIME AS NEEDED FOR SLEEP., Disp: 30 capsule, Rfl: 0 .  amoxicillin-clavulanate (AUGMENTIN) 875-125 MG tablet, Take 1 tablet by mouth 2 (two) times daily. (Patient not taking: Reported on 12/26/2019), Disp: 20 tablet, Rfl: 0 .  glucose blood (ACCU-CHEK AVIVA PLUS) test  strip, CHECK BLOOD SUGAR 4 TIMES A DAY., Disp: 100 each, Rfl: 11 .  Influenza Vac A&B Surf Ant Adj (FLUAD) 0.5 ML SUSY, , Disp: , Rfl:  .  Lancets MISC, Check blood sugar 4 times a day, Disp: 100 each, Rfl: 11 .  nitroGLYCERIN (NITROSTAT) 0.4 MG SL tablet, DISSOLVE 1 TABLET UNDER TONGUE AS NEEDED FOR CHEST PAIN,MAY REPEAT IN5 MINUTES FOR 2 DOSES., Disp: 25 tablet, Rfl: 3 .  traMADol (ULTRAM) 50 MG tablet, Take 1 tablet (50 mg total) by mouth every 6 (six) hours as needed for moderate pain or severe pain., Disp: 20 tablet, Rfl: 0 Allergies  Allergen Reactions  . Metoprolol Shortness Of Breath, Palpitations and Other (See Comments)    Heart starts racing. Shallow breathing; pt states he also get skin irritation on his legs  . Metformin Hives and Swelling    On legs  . Metformin And Related Nausea And Vomiting      Social History   Socioeconomic History  . Marital status: Married    Spouse name: Not on file  . Number of children: Not on file  . Years of education: Not on file  . Highest education level: Not on file  Occupational History  . Occupation: Retired  Tobacco Use  . Smoking status: Former Smoker    Packs/day: 1.50    Years: 35.00    Pack years: 52.50    Types: Cigarettes  . Smokeless tobacco: Never Used  . Tobacco comment: quit atleast 25 yrs ago, per pt  Substance and Sexual Activity  . Alcohol use: Yes    Alcohol/week: 3.0 standard drinks    Types: 3 Shots of liquor per week    Comment: socially  . Drug use: No  . Sexual activity: Yes  Other Topics Concern  . Not on file  Social History Narrative   Pt lives in [place] with spouse.   Retired from [employer].  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   Social Determinants of Health   Financial Resource Strain:   . Difficulty of Paying Living Expenses: Not on file  Food Insecurity:   . Worried About Charity fundraiser in the Last Year: Not on file  . Ran Out  of Food in the Last Year: Not on file  Transportation Needs:   . Lack of Transportation (Medical): Not on file  . Lack of Transportation (Non-Medical): Not on file  Physical Activity:   . Days of Exercise per Week: Not on file  . Minutes of Exercise per Session: Not on file  Stress:   . Feeling of Stress : Not on file  Social Connections:   . Frequency of Communication with Friends and Family: Not on file  . Frequency of Social Gatherings with Friends and Family: Not on file  . Attends Religious Services: Not on file  . Active Member of Clubs or Organizations: Not on file  . Attends Archivist Meetings: Not on file  . Marital Status: Not on file  Intimate Partner Violence:   . Fear  of Current or Ex-Partner: Not on file  . Emotionally Abused: Not on file  . Physically Abused: Not on file  . Sexually Abused: Not on file    Physical Exam Cardiovascular:     Rate and Rhythm: Normal rate and regular rhythm.     Pulses: Normal pulses.  Pulmonary:     Effort: Pulmonary effort is normal.  Abdominal:     General: Abdomen is flat.  Musculoskeletal:        General: Normal range of motion.     Right lower leg: No edema.     Left lower leg: No edema.  Skin:    General: Skin is warm and dry.     Capillary Refill: Capillary refill takes less than 2 seconds.  Neurological:     Mental Status: He is alert and oriented to person, place, and time.  Psychiatric:        Mood and Affect: Mood normal.         Future Appointments  Date Time Provider [place]  01/31/2020 10:20 AM Larey Dresser, MD [facility] None  02/04/2020  2:45 PM Gardiner Barefoot, DPM [facility]    BP (!) 147/74 (BP Location: Right Arm, Patient Position: Sitting, Cuff Size: Normal)   Pulse 68   Resp 16   Wt 189 lb (85.7 kg)   SpO2 96%   BMI 28.74 kg/m   Weight yesterday- did not weigh Last visit weight- 192 lb  Andrew [name] was seen at home today and reported feeling well. He denied  chest pain, SOB, headache, dizziness, orthopnea, fever or cough since our last visit. He stated he has been compliant with his medications over the past two weeks and his weight has been stable. His medications were verified and his pillbox was refilled. I will follow up next week.   [name], EMT 12/26/19  ACTION: Home visit completed Next visit planned for 1 week

## 2019-12-29 DIAGNOSIS — J449 Chronic obstructive pulmonary disease, unspecified: Secondary | ICD-10-CM | POA: Diagnosis not present

## 2020-01-02 ENCOUNTER — Other Ambulatory Visit (HOSPITAL_COMMUNITY): Payer: Self-pay

## 2020-01-02 ENCOUNTER — Other Ambulatory Visit (HOSPITAL_COMMUNITY): Payer: Self-pay | Admitting: Cardiology

## 2020-01-02 NOTE — Progress Notes (Signed)
Paramedicine Encounter    Patient ID: Andrew [name], male    DOB: 08-24-1928, 86 y.o.   MRN: [id number]   Patient Care Team: [name], MD as PCP - General (Family Medicine) Larey Dresser, MD as PCP - Advanced Heart Failure (Cardiology) Larey Dresser, MD as Consulting Physician (Cardiology) Jorge Ny, LCSW as Social Worker (Licensed Clinical Social Worker) Gardiner Barefoot, DPM as Consulting Physician (Podiatry)  Patient Active Problem List   Diagnosis Date Noted  . Chronic respiratory failure with hypoxia ([place]) 09/16/2018  . Bilateral recurrent inguinal hernias 11/28/2017  . Carotid artery stenosis 01/12/2016  . Chronic systolic CHF (congestive heart failure) ([place]) 12/02/2015  . 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 ([place]) 10/16/2015  . PVD (peripheral vascular disease) ([place])   . Hereditary and idiopathic peripheral neuropathy 10/02/2015  . Peripelvic (lymphatic) cyst   . Pernicious anemia 07/15/2015  . Bilateral renal cysts 07/12/2015  . Lung nodule, 62mm RML CT 07/12/15 07/12/2015  . Stage 3 chronic kidney disease 06/14/2015  . Paroxysmal atrial fibrillation ([place]) 05/28/2015  . AAA (abdominal aortic aneurysm) without rupture ([place]) 09/30/2014  . Cardiomyopathy, ischemic 05/23/2014  . DM (diabetes mellitus), type 2 with renal complications ([place]) 72/08/4708  . Depression, major, single episode, complete remission ([place]) 03/18/2010  . Hypothyroidism 02/03/2009  . Osteoarthritis 05/27/2008  . Hyperlipidemia associated with type 2 diabetes mellitus ([place]) 06/14/2007  . Hypertension associated with diabetes ([place]) 06/14/2007  . GERD 06/14/2007    Current Outpatient Medications:  .  acetaminophen (TYLENOL) 500 MG tablet, Take 1,000 mg by mouth every 6 (six) hours as needed for mild pain or headache., Disp: , Rfl:  .  amiodarone (PACERONE) 200 MG tablet, Take 0.5 tablets (100 mg total) by mouth daily., Disp: 45 tablet, Rfl: 3 .  amoxicillin-clavulanate  (AUGMENTIN) 875-125 MG tablet, Take 1 tablet by mouth 2 (two) times daily. (Patient not taking: Reported on 12/26/2019), Disp: 20 tablet, Rfl: 0 .  apixaban (ELIQUIS) 2.5 MG TABS tablet, Take 1 tablet (2.5 mg total) by mouth 2 (two) times daily., Disp: 180 tablet, Rfl: 3 .  atorvastatin (LIPITOR) 80 MG tablet, Take 1 tablet (80 mg total) by mouth daily., Disp: 30 tablet, Rfl: 5 .  carvedilol (COREG) 12.5 MG tablet, Take 1 tablet (12.5 mg total) by mouth 2 (two) times daily., Disp: 180 tablet, Rfl: 0 .  cilostazol (PLETAL) 100 MG tablet, Take 1 tablet (100 mg total) by mouth 2 (two) times daily., Disp: 180 tablet, Rfl: 3 .  ezetimibe (ZETIA) 10 MG tablet, TAKE (1) TABLET DAILY., Disp: 90 tablet, Rfl: 0 .  FLUoxetine (PROZAC) 20 MG capsule, TAKE (1) CAPSULE DAILY., Disp: 90 capsule, Rfl: 0 .  furosemide (LASIX) 80 MG tablet, TAKE 1&1/2 TABLETS IN THE MORNING AND 1 TABLET IN THE AFTERNOON., Disp: 225 tablet, Rfl: 6 .  gabapentin (NEURONTIN) 100 MG capsule, Take 100 mg by mouth 3 (three) times daily., Disp: , Rfl:  .  glucose blood (ACCU-CHEK AVIVA PLUS) test strip, CHECK BLOOD SUGAR 4 TIMES A DAY., Disp: 100 each, Rfl: 11 .  hydrALAZINE (APRESOLINE) 50 MG tablet, Take 1 tablet (50 mg total) by mouth 3 (three) times daily., Disp: 270 tablet, Rfl: 3 .  Influenza Vac A&B Surf Ant Adj (FLUAD) 0.5 ML SUSY, , Disp: , Rfl:  .  isosorbide mononitrate (IMDUR) 30 MG 24 hr tablet, TAKE 1 TABLET EACH DAY., Disp: 90 tablet, Rfl: 3 .  Lancets MISC, Check blood sugar 4 times a day, Disp:  100 each, Rfl: 11 .  levothyroxine (SYNTHROID) 100 MCG tablet, Take 1 tablet (100 mcg total) by mouth daily before breakfast., Disp: 90 tablet, Rfl: 1 .  Multiple Vitamin (MULTIVITAMIN) tablet, Take 1 tablet by mouth daily., Disp: , Rfl:  .  nitroGLYCERIN (NITROSTAT) 0.4 MG SL tablet, DISSOLVE 1 TABLET UNDER TONGUE AS NEEDED FOR CHEST PAIN,MAY REPEAT IN5 MINUTES FOR 2 DOSES., Disp: 25 tablet, Rfl: 3 .  potassium chloride SA  (KLOR-CON) 20 MEQ tablet, TAKE 1 TABLET ONCE DAILY., Disp: 90 tablet, Rfl: 3 .  SPIRIVA HANDIHALER 18 MCG inhalation capsule, PLACE 1 CAPSULE INTO INHALER AND INHALE ONCE DAILY. (Patient taking differently: Place 18 mcg into inhaler and inhale daily. ), Disp: 90 capsule, Rfl: 0 .  temazepam (RESTORIL) 30 MG capsule, TAKE 1 CAPSULE AT BEDTIME AS NEEDED FOR SLEEP., Disp: 30 capsule, Rfl: 0 .  traMADol (ULTRAM) 50 MG tablet, Take 1 tablet (50 mg total) by mouth every 6 (six) hours as needed for moderate pain or severe pain., Disp: 20 tablet, Rfl: 0 Allergies  Allergen Reactions  . Metoprolol Shortness Of Breath, Palpitations and Other (See Comments)    Heart starts racing. Shallow breathing; pt states he also get skin irritation on his legs  . Metformin Hives and Swelling    On legs  . Metformin And Related Nausea And Vomiting      Social History   Socioeconomic History  . Marital status: Married    Spouse name: Not on file  . Number of children: Not on file  . Years of education: Not on file  . Highest education level: Not on file  Occupational History  . Occupation: Retired  Tobacco Use  . Smoking status: Former Smoker    Packs/day: 1.50    Years: 35.00    Pack years: 52.50    Types: Cigarettes  . Smokeless tobacco: Never Used  . Tobacco comment: quit atleast 25 yrs ago, per pt  Substance and Sexual Activity  . Alcohol use: Yes    Alcohol/week: 3.0 standard drinks    Types: 3 Shots of liquor per week    Comment: socially  . Drug use: No  . Sexual activity: Yes  Other Topics Concern  . Not on file  Social History Narrative   Pt lives in [place] with spouse.   Retired from [employer].  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   Social Determinants of Health   Financial Resource Strain:   . Difficulty of Paying Living Expenses: Not on file  Food Insecurity:   . Worried About Charity fundraiser in the Last Year: Not on  file  . Ran Out of Food in the Last Year: Not on file  Transportation Needs:   . Lack of Transportation (Medical): Not on file  . Lack of Transportation (Non-Medical): Not on file  Physical Activity:   . Days of Exercise per Week: Not on file  . Minutes of Exercise per Session: Not on file  Stress:   . Feeling of Stress : Not on file  Social Connections:   . Frequency of Communication with Friends and Family: Not on file  . Frequency of Social Gatherings with Friends and Family: Not on file  . Attends Religious Services: Not on file  . Active Member of Clubs or Organizations: Not on file  . Attends Archivist Meetings: Not on file  . Marital Status: Not on file  Intimate Partner Violence:   . Fear  of Current or Ex-Partner: Not on file  . Emotionally Abused: Not on file  . Physically Abused: Not on file  . Sexually Abused: Not on file    Physical Exam Cardiovascular:     Rate and Rhythm: Normal rate and regular rhythm.     Pulses: Normal pulses.  Pulmonary:     Effort: Pulmonary effort is normal.     Breath sounds: Normal breath sounds.  Abdominal:     General: There is no distension.  Musculoskeletal:        General: Normal range of motion.     Right lower leg: No edema.     Left lower leg: No edema.  Skin:    General: Skin is warm and dry.     Capillary Refill: Capillary refill takes less than 2 seconds.  Neurological:     Mental Status: He is alert and oriented to person, place, and time.  Psychiatric:        Mood and Affect: Mood normal.         Future Appointments  Date Time Provider [place]  01/31/2020 10:20 AM Larey Dresser, MD [facility] None  02/04/2020  2:45 PM Gardiner Barefoot, DPM [facility]    BP 132/69 (BP Location: Left Arm, Patient Position: Sitting, Cuff Size: Normal)   Pulse 75   Resp 16   Wt 189 lb (85.7 kg)   SpO2 93%   BMI 28.74 kg/m   Weight yesterday- did not weigh Last visit weight- 189 lb  Mr Mcphillips  was seen at home today and reported feeling well. He denied chest pain, SOB, headache, dizziness, orthopnea, fever or cough since our last appointment. He stated he has been compliant with his medications over the past week however there were 3 evenings where he missed his medications but his weight has remained stable. His medications were verified and his pillbox was refilled. I will follow up next week.   [name], EMT 01/02/20  ACTION: Home visit completed Next visit planned for 1 week

## 2020-01-09 ENCOUNTER — Other Ambulatory Visit (HOSPITAL_COMMUNITY): Payer: Self-pay

## 2020-01-09 NOTE — Progress Notes (Signed)
Paramedicine Encounter    Patient ID: Andrew [name], male    DOB: [birth year], 86 y.o.   MRN: [id number]   Patient Care Team: [name], MD as PCP - General (Family Medicine) Larey Dresser, MD as PCP - Advanced Heart Failure (Cardiology) Larey Dresser, MD as Consulting Physician (Cardiology) Jorge Ny, LCSW as Social Worker (Licensed Clinical Social Worker) Gardiner Barefoot, DPM as Consulting Physician (Podiatry)  Patient Active Problem List   Diagnosis Date Noted  . Chronic respiratory failure with hypoxia ([place]) 09/16/2018  . Bilateral recurrent inguinal hernias 11/28/2017  . Carotid artery stenosis 01/12/2016  . Chronic systolic CHF (congestive heart failure) ([place]) 12/02/2015  . 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 PVD (peripheral vascular disease) ([place])   . Hereditary and idiopathic peripheral neuropathy 10/02/2015  . Peripelvic (lymphatic) cyst   . Pernicious anemia 07/15/2015  . Bilateral renal cysts 07/12/2015  . Lung nodule, 23mm RML CT 07/12/15 07/12/2015  . Stage 3 chronic kidney disease 06/14/2015  . Paroxysmal atrial fibrillation ([place]) 05/28/2015  . AAA (abdominal aortic aneurysm) without rupture ([place]) 09/30/2014  . Cardiomyopathy, ischemic 05/23/2014  . DM (diabetes mellitus), type 2 with renal complications ([place]) 78/29/5621  . Depression, major, single episode, complete remission ([place]) 03/18/2010  . Hypothyroidism 02/03/2009  . Osteoarthritis 05/27/2008  . Hyperlipidemia associated with type 2 diabetes mellitus ([place]) 06/14/2007  . Hypertension associated with diabetes ([place]) 06/14/2007  . GERD 06/14/2007    Current Outpatient Medications:  .  acetaminophen (TYLENOL) 500 MG tablet, Take 1,000 mg by mouth every 6 (six) hours as needed for mild pain or headache., Disp: , Rfl:  .  amiodarone (PACERONE) 200 MG tablet, Take 0.5 tablets (100 mg total) by mouth daily., Disp: 45 tablet, Rfl: 3 .  amoxicillin-clavulanate  (AUGMENTIN) 875-125 MG tablet, Take 1 tablet by mouth 2 (two) times daily. (Patient not taking: Reported on 12/26/2019), Disp: 20 tablet, Rfl: 0 .  apixaban (ELIQUIS) 2.5 MG TABS tablet, Take 1 tablet (2.5 mg total) by mouth 2 (two) times daily., Disp: 180 tablet, Rfl: 3 .  atorvastatin (LIPITOR) 80 MG tablet, Take 1 tablet (80 mg total) by mouth daily., Disp: 30 tablet, Rfl: 5 .  carvedilol (COREG) 12.5 MG tablet, TAKE 1 TABLET BY MOUTH TWICE DAILY., Disp: 180 tablet, Rfl: 0 .  cilostazol (PLETAL) 100 MG tablet, Take 1 tablet (100 mg total) by mouth 2 (two) times daily., Disp: 180 tablet, Rfl: 3 .  ezetimibe (ZETIA) 10 MG tablet, TAKE (1) TABLET DAILY., Disp: 90 tablet, Rfl: 0 .  FLUoxetine (PROZAC) 20 MG capsule, TAKE (1) CAPSULE DAILY., Disp: 90 capsule, Rfl: 0 .  furosemide (LASIX) 80 MG tablet, TAKE 1&1/2 TABLETS IN THE MORNING AND 1 TABLET IN THE AFTERNOON., Disp: 225 tablet, Rfl: 6 .  gabapentin (NEURONTIN) 100 MG capsule, Take 100 mg by mouth 3 (three) times daily., Disp: , Rfl:  .  glucose blood (ACCU-CHEK AVIVA PLUS) test strip, CHECK BLOOD SUGAR 4 TIMES A DAY., Disp: 100 each, Rfl: 11 .  hydrALAZINE (APRESOLINE) 50 MG tablet, Take 1 tablet (50 mg total) by mouth 3 (three) times daily., Disp: 270 tablet, Rfl: 3 .  Influenza Vac A&B Surf Ant Adj (FLUAD) 0.5 ML SUSY, , Disp: , Rfl:  .  isosorbide mononitrate (IMDUR) 30 MG 24 hr tablet, TAKE 1 TABLET EACH DAY., Disp: 90 tablet, Rfl: 3 .  Lancets MISC, Check blood sugar 4 times a day, Disp: 100 each, Rfl: 11 .  levothyroxine (SYNTHROID) 100 MCG tablet, Take 1 tablet (100 mcg total) by mouth daily before breakfast., Disp: 90 tablet, Rfl: 1 .  Multiple Vitamin (MULTIVITAMIN) tablet, Take 1 tablet by mouth daily., Disp: , Rfl:  .  nitroGLYCERIN (NITROSTAT) 0.4 MG SL tablet, DISSOLVE 1 TABLET UNDER TONGUE AS NEEDED FOR CHEST PAIN,MAY REPEAT IN5 MINUTES FOR 2 DOSES., Disp: 25 tablet, Rfl: 3 .  potassium chloride SA (KLOR-CON) 20 MEQ tablet, TAKE 1  TABLET ONCE DAILY., Disp: 90 tablet, Rfl: 3 .  SPIRIVA HANDIHALER 18 MCG inhalation capsule, PLACE 1 CAPSULE INTO INHALER AND INHALE ONCE DAILY. (Patient taking differently: Place 18 mcg into inhaler and inhale daily. ), Disp: 90 capsule, Rfl: 0 .  temazepam (RESTORIL) 30 MG capsule, TAKE 1 CAPSULE AT BEDTIME AS NEEDED FOR SLEEP., Disp: 30 capsule, Rfl: 0 .  traMADol (ULTRAM) 50 MG tablet, Take 1 tablet (50 mg total) by mouth every 6 (six) hours as needed for moderate pain or severe pain., Disp: 20 tablet, Rfl: 0 Allergies  Allergen Reactions  . Metoprolol Shortness Of Breath, Palpitations and Other (See Comments)    Heart starts racing. Shallow breathing; pt states he also get skin irritation on his legs  . Metformin Hives and Swelling    On legs  . Metformin And Related Nausea And Vomiting      Social History   Socioeconomic History  . Marital status: Married    Spouse name: Not on file  . Number of children: Not on file  . Years of education: Not on file  . Highest education level: Not on file  Occupational History  . Occupation: Retired  Tobacco Use  . Smoking status: Former Smoker    Packs/day: 1.50    Years: 35.00    Pack years: 52.50    Types: Cigarettes  . Smokeless tobacco: Never Used  . Tobacco comment: quit atleast 25 yrs ago, per pt  Substance and Sexual Activity  . Alcohol use: Yes    Alcohol/week: 3.0 standard drinks    Types: 3 Shots of liquor per week    Comment: socially  . Drug use: No  . Sexual activity: Yes  Other Topics Concern  . Not on file  Social History Narrative   Pt lives in [place] with spouse.   Retired from [employer].  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   Social Determinants of Health   Financial Resource Strain:   . Difficulty of Paying Living Expenses: Not on file  Food Insecurity:   . Worried About Charity fundraiser in the Last Year: Not on file  . Ran Out of Food in the  Last Year: Not on file  Transportation Needs:   . Lack of Transportation (Medical): Not on file  . Lack of Transportation (Non-Medical): Not on file  Physical Activity:   . Days of Exercise per Week: Not on file  . Minutes of Exercise per Session: Not on file  Stress:   . Feeling of Stress : Not on file  Social Connections:   . Frequency of Communication with Friends and Family: Not on file  . Frequency of Social Gatherings with Friends and Family: Not on file  . Attends Religious Services: Not on file  . Active Member of Clubs or Organizations: Not on file  . Attends Archivist Meetings: Not on file  . Marital Status: Not on file  Intimate Partner Violence:   . Fear of Current or Ex-Partner: Not on  file  . Emotionally Abused: Not on file  . Physically Abused: Not on file  . Sexually Abused: Not on file    Physical Exam Cardiovascular:     Rate and Rhythm: Normal rate and regular rhythm.     Pulses: Normal pulses.  Pulmonary:     Effort: Pulmonary effort is normal.     Breath sounds: Normal breath sounds.  Musculoskeletal:        General: Normal range of motion.     Right lower leg: No edema.     Left lower leg: No edema.  Skin:    General: Skin is warm and dry.     Capillary Refill: Capillary refill takes less than 2 seconds.  Neurological:     Mental Status: He is alert and oriented to person, place, and time.  Psychiatric:        Mood and Affect: Mood normal.         Future Appointments  Date Time Provider [place]  01/31/2020 10:20 AM Larey Dresser, MD [facility] None  02/04/2020  2:45 PM Gardiner Barefoot, DPM [facility]    BP (!) 104/52 (BP Location: Left Arm, Patient Position: Sitting, Cuff Size: Normal)   Pulse 84   Resp 16   Wt 189 lb (85.7 kg)   SpO2 90%   BMI 28.74 kg/m    Weight yesterday- did not weigh Last visit weight- 189 lb  Mr Merriweather was seen at home today and reported feeling well. He denied chest pain, SOB,  headache, dizziness, orthopnea, fever or cough since our last visit. He stated he has been compliant with his medications over the oast week and his weight has been stable. His medications were verified and his pillbox was refilled. I will follow up next week.   [name], EMT 01/09/20  ACTION: Home visit completed Next visit planned for 1 week

## 2020-01-14 ENCOUNTER — Ambulatory Visit: Payer: Medicare PPO | Attending: Internal Medicine

## 2020-01-14 DIAGNOSIS — Z23 Encounter for immunization: Secondary | ICD-10-CM | POA: Insufficient documentation

## 2020-01-14 NOTE — Progress Notes (Signed)
   Covid-19 Vaccination Clinic  Name:  Andrew [name]    MRN: [id number] DOB: 1928-01-03  01/14/2020  Andrew [name] was observed post Covid-19 immunization for 15 minutes without incidence. He was provided with Vaccine Information Sheet and instruction to access the V-Safe system.   Andrew [name] was instructed to call 911 with any severe reactions post vaccine: Marland Kitchen Difficulty breathing  . Swelling of your face and throat  . A fast heartbeat  . A bad rash all over your body  . Dizziness and weakness    Immunizations Administered    Name Date Dose VIS Date Route   Pfizer COVID-19 Vaccine 01/14/2020  2:34 PM 0.3 mL 12/06/2019 Intramuscular   Manufacturer: [place]   Lot: [id number]   [place]: [address]-5

## 2020-01-15 ENCOUNTER — Telehealth (HOSPITAL_COMMUNITY): Payer: Self-pay

## 2020-01-15 NOTE — Telephone Encounter (Signed)
Called Mr Buhl to schedule an appointment. He stated he would be available tomorrow so we agreed to meet at 11:00.   [name], EMT 01/15/20

## 2020-01-16 ENCOUNTER — Other Ambulatory Visit (HOSPITAL_COMMUNITY): Payer: Self-pay

## 2020-01-16 NOTE — Progress Notes (Signed)
Paramedicine Encounter    Patient ID: Andrew [name], male    DOB: [birth year], 86 y.o.   MRN: [id number]   Patient Care Team: [name], MD as PCP - General (Family Medicine) Larey Dresser, MD as PCP - Advanced Heart Failure (Cardiology) Larey Dresser, MD as Consulting Physician (Cardiology) Jorge Ny, LCSW as Social Worker (Licensed Clinical Social Worker) Gardiner Barefoot, DPM as Consulting Physician (Podiatry)  Patient Active Problem List   Diagnosis Date Noted  . Chronic respiratory failure with hypoxia ([place]) 09/16/2018  . Bilateral recurrent inguinal hernias 11/28/2017  . Carotid artery stenosis 01/12/2016  . Chronic systolic CHF (congestive heart failure) ([place]) 12/02/2015  . 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 ([place]) 10/16/2015  . PVD (peripheral vascular disease) ([place])   . Hereditary and idiopathic peripheral neuropathy 10/02/2015  . Peripelvic (lymphatic) cyst   . Pernicious anemia 07/15/2015  . Bilateral renal cysts 07/12/2015  . Lung nodule, 58mm RML CT 07/12/15 07/12/2015  . Stage 3 chronic kidney disease 06/14/2015  . Paroxysmal atrial fibrillation ([place]) 05/28/2015  . AAA (abdominal aortic aneurysm) without rupture ([place]) 09/30/2014  . Cardiomyopathy, ischemic 05/23/2014  . DM (diabetes mellitus), type 2 with renal complications ([place]) 42/59/5638  . Depression, major, single episode, complete remission ([place]) 03/18/2010  . Hypothyroidism 02/03/2009  . Osteoarthritis 05/27/2008  . Hyperlipidemia associated with type 2 diabetes mellitus ([place]) 06/14/2007  . Hypertension associated with diabetes ([place]) 06/14/2007  . GERD 06/14/2007    Current Outpatient Medications:  .  acetaminophen (TYLENOL) 500 MG tablet, Take 1,000 mg by mouth every 6 (six) hours as needed for mild pain or headache., Disp: , Rfl:  .  amiodarone (PACERONE) 200 MG tablet, Take 0.5 tablets (100 mg total) by mouth daily., Disp: 45 tablet, Rfl: 3 .  apixaban (ELIQUIS) 2.5 MG TABS  tablet, Take 1 tablet (2.5 mg total) by mouth 2 (two) times daily., Disp: 180 tablet, Rfl: 3 .  atorvastatin (LIPITOR) 80 MG tablet, Take 1 tablet (80 mg total) by mouth daily., Disp: 30 tablet, Rfl: 5 .  carvedilol (COREG) 12.5 MG tablet, TAKE 1 TABLET BY MOUTH TWICE DAILY., Disp: 180 tablet, Rfl: 0 .  cilostazol (PLETAL) 100 MG tablet, Take 1 tablet (100 mg total) by mouth 2 (two) times daily., Disp: 180 tablet, Rfl: 3 .  ezetimibe (ZETIA) 10 MG tablet, TAKE (1) TABLET DAILY., Disp: 90 tablet, Rfl: 0 .  FLUoxetine (PROZAC) 20 MG capsule, TAKE (1) CAPSULE DAILY., Disp: 90 capsule, Rfl: 0 .  furosemide (LASIX) 80 MG tablet, TAKE 1&1/2 TABLETS IN THE MORNING AND 1 TABLET IN THE AFTERNOON., Disp: 225 tablet, Rfl: 6 .  gabapentin (NEURONTIN) 100 MG capsule, Take 100 mg by mouth 3 (three) times daily., Disp: , Rfl:  .  hydrALAZINE (APRESOLINE) 50 MG tablet, Take 1 tablet (50 mg total) by mouth 3 (three) times daily., Disp: 270 tablet, Rfl: 3 .  isosorbide mononitrate (IMDUR) 30 MG 24 hr tablet, TAKE 1 TABLET EACH DAY., Disp: 90 tablet, Rfl: 3 .  levothyroxine (SYNTHROID) 100 MCG tablet, Take 1 tablet (100 mcg total) by mouth daily before breakfast., Disp: 90 tablet, Rfl: 1 .  Multiple Vitamin (MULTIVITAMIN) tablet, Take 1 tablet by mouth daily., Disp: , Rfl:  .  potassium chloride SA (KLOR-CON) 20 MEQ tablet, TAKE 1 TABLET ONCE DAILY., Disp: 90 tablet, Rfl: 3 .  temazepam (RESTORIL) 30 MG capsule, TAKE 1 CAPSULE AT BEDTIME AS NEEDED FOR SLEEP., Disp: 30 capsule, Rfl: 0 .  amoxicillin-clavulanate (  AUGMENTIN) 875-125 MG tablet, Take 1 tablet by mouth 2 (two) times daily. (Patient not taking: Reported on 12/26/2019), Disp: 20 tablet, Rfl: 0 .  glucose blood (ACCU-CHEK AVIVA PLUS) test strip, CHECK BLOOD SUGAR 4 TIMES A DAY., Disp: 100 each, Rfl: 11 .  Influenza Vac A&B Surf Ant Adj (FLUAD) 0.5 ML SUSY, , Disp: , Rfl:  .  Lancets MISC, Check blood sugar 4 times a day, Disp: 100 each, Rfl: 11 .   nitroGLYCERIN (NITROSTAT) 0.4 MG SL tablet, DISSOLVE 1 TABLET UNDER TONGUE AS NEEDED FOR CHEST PAIN,MAY REPEAT IN5 MINUTES FOR 2 DOSES., Disp: 25 tablet, Rfl: 3 .  SPIRIVA HANDIHALER 18 MCG inhalation capsule, PLACE 1 CAPSULE INTO INHALER AND INHALE ONCE DAILY. (Patient taking differently: Place 18 mcg into inhaler and inhale daily. ), Disp: 90 capsule, Rfl: 0 .  traMADol (ULTRAM) 50 MG tablet, Take 1 tablet (50 mg total) by mouth every 6 (six) hours as needed for moderate pain or severe pain. (Patient not taking: Reported on 01/16/2020), Disp: 20 tablet, Rfl: 0 Allergies  Allergen Reactions  . Metoprolol Shortness Of Breath, Palpitations and Other (See Comments)    Heart starts racing. Shallow breathing; pt states he also get skin irritation on his legs  . Metformin Hives and Swelling    On legs  . Metformin And Related Nausea And Vomiting      Social History   Socioeconomic History  . Marital status: Married    Spouse name: Not on file  . Number of children: Not on file  . Years of education: Not on file  . Highest education level: Not on file  Occupational History  . Occupation: Retired  Tobacco Use  . Smoking status: Former Smoker    Packs/day: 1.50    Years: 35.00    Pack years: 52.50    Types: Cigarettes  . Smokeless tobacco: Never Used  . Tobacco comment: quit atleast 25 yrs ago, per pt  Substance and Sexual Activity  . Alcohol use: Yes    Alcohol/week: 3.0 standard drinks    Types: 3 Shots of liquor per week    Comment: socially  . Drug use: No  . Sexual activity: Yes  Other Topics Concern  . Not on file  Social History Narrative   Pt lives in [place] with spouse.   Retired from [employer].  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   Social Determinants of Health   Financial Resource Strain:   . Difficulty of Paying Living Expenses: Not on file  Food Insecurity:   . Worried About Charity fundraiser in the Last  Year: Not on file  . Ran Out of Food in the Last Year: Not on file  Transportation Needs:   . Lack of Transportation (Medical): Not on file  . Lack of Transportation (Non-Medical): Not on file  Physical Activity:   . Days of Exercise per Week: Not on file  . Minutes of Exercise per Session: Not on file  Stress:   . Feeling of Stress : Not on file  Social Connections:   . Frequency of Communication with Friends and Family: Not on file  . Frequency of Social Gatherings with Friends and Family: Not on file  . Attends Religious Services: Not on file  . Active Member of Clubs or Organizations: Not on file  . Attends Archivist Meetings: Not on file  . Marital Status: Not on file  Intimate Partner Violence:   .  Fear of Current or Ex-Partner: Not on file  . Emotionally Abused: Not on file  . Physically Abused: Not on file  . Sexually Abused: Not on file    Physical Exam Cardiovascular:     Rate and Rhythm: Normal rate and regular rhythm.     Pulses: Normal pulses.  Pulmonary:     Effort: Pulmonary effort is normal.     Breath sounds: Normal breath sounds.  Abdominal:     General: There is no distension.  Musculoskeletal:        General: Normal range of motion.     Right lower leg: No edema.     Left lower leg: No edema.  Skin:    General: Skin is warm and dry.     Capillary Refill: Capillary refill takes less than 2 seconds.  Neurological:     Mental Status: He is alert and oriented to person, place, and time.  Psychiatric:        Mood and Affect: Mood normal.         Future Appointments  Date Time Provider [place]  01/31/2020 10:20 AM Larey Dresser, MD [facility] None  02/03/2020  3:45 PM [place] [facility]  02/04/2020  2:45 PM Gardiner Barefoot, DPM [facility]    BP (!) 115/58 (BP Location: Left Arm, Patient Position: Sitting, Cuff Size: Normal)   Pulse 71   Resp 16   Wt 188 lb (85.3 kg)   SpO2 92%   BMI 28.59 kg/m    Weight yesterday- 188 lb Last visit weight- 189 lb  Mr Ariola was seen at home today and reported feeling well. He denied chest pain, SOB, headache, dizziness, orthopnea, fever or cough since our last visit. He reported being compliant with his medications over the past week however he had missed two days of evening medications. He reported his weight has been stable and said he would do better about medication compliance. His medications were verified and his pillbox was refilled. I will follow up next week.   [name], EMT 01/16/20  ACTION: Home visit completed Next visit planned for 1 week

## 2020-01-23 ENCOUNTER — Other Ambulatory Visit (HOSPITAL_COMMUNITY): Payer: Self-pay

## 2020-01-23 NOTE — Progress Notes (Signed)
Paramedicine Encounter    Patient ID: Andrew [name], male    DOB: Mar 01, 1928, 86 y.o.   MRN: [id number]   Patient Care Team: [name], MD as PCP - General (Family Medicine) Larey Dresser, MD as PCP - Advanced Heart Failure (Cardiology) Larey Dresser, MD as Consulting Physician (Cardiology) Jorge Ny, LCSW as Social Worker (Licensed Clinical Social Worker) Gardiner Barefoot, DPM as Consulting Physician (Podiatry)  Patient Active Problem List   Diagnosis Date Noted  . Chronic respiratory failure with hypoxia ([place]) 09/16/2018  . Bilateral recurrent inguinal hernias 11/28/2017  . Carotid artery stenosis 01/12/2016  . Chronic systolic CHF (congestive heart failure) ([name]) 12/02/2015  . 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 PVD (peripheral vascular disease) ([name])   . Hereditary and idiopathic peripheral neuropathy 10/02/2015  . Peripelvic (lymphatic) cyst   . Pernicious anemia 07/15/2015  . Bilateral renal cysts 07/12/2015  . Lung nodule, 59mm RML CT 07/12/15 07/12/2015  . Stage 3 chronic kidney disease 06/14/2015  . Paroxysmal atrial fibrillation ([name]) 05/28/2015  . AAA (abdominal aortic aneurysm) without rupture ([name]) 09/30/2014  . Cardiomyopathy, ischemic 05/23/2014  . DM (diabetes mellitus), type 2 with renal complications ([name]) 93/79/0240  . Depression, major, single episode, complete remission ([name]) 03/18/2010  . Hypothyroidism 02/03/2009  . Osteoarthritis 05/27/2008  . Hyperlipidemia associated with type 2 diabetes mellitus ([name]) 06/14/2007  . Hypertension associated with diabetes ([name]) 06/14/2007  . GERD 06/14/2007    Current Outpatient Medications:  .  acetaminophen (TYLENOL) 500 MG tablet, Take 1,000 mg by mouth every 6 (six) hours as needed for mild pain or headache., Disp: , Rfl:  .  amiodarone (PACERONE) 200 MG tablet, Take 0.5 tablets (100 mg total) by mouth daily., Disp: 45 tablet, Rfl: 3 .  apixaban (ELIQUIS) 2.5 MG TABS  tablet, Take 1 tablet (2.5 mg total) by mouth 2 (two) times daily., Disp: 180 tablet, Rfl: 3 .  atorvastatin (LIPITOR) 80 MG tablet, Take 1 tablet (80 mg total) by mouth daily., Disp: 30 tablet, Rfl: 5 .  carvedilol (COREG) 12.5 MG tablet, TAKE 1 TABLET BY MOUTH TWICE DAILY., Disp: 180 tablet, Rfl: 0 .  cilostazol (PLETAL) 100 MG tablet, Take 1 tablet (100 mg total) by mouth 2 (two) times daily., Disp: 180 tablet, Rfl: 3 .  ezetimibe (ZETIA) 10 MG tablet, TAKE (1) TABLET DAILY., Disp: 90 tablet, Rfl: 0 .  FLUoxetine (PROZAC) 20 MG capsule, TAKE (1) CAPSULE DAILY., Disp: 90 capsule, Rfl: 0 .  furosemide (LASIX) 80 MG tablet, TAKE 1&1/2 TABLETS IN THE MORNING AND 1 TABLET IN THE AFTERNOON., Disp: 225 tablet, Rfl: 6 .  gabapentin (NEURONTIN) 100 MG capsule, Take 100 mg by mouth 3 (three) times daily., Disp: , Rfl:  .  hydrALAZINE (APRESOLINE) 50 MG tablet, Take 1 tablet (50 mg total) by mouth 3 (three) times daily., Disp: 270 tablet, Rfl: 3 .  isosorbide mononitrate (IMDUR) 30 MG 24 hr tablet, TAKE 1 TABLET EACH DAY., Disp: 90 tablet, Rfl: 3 .  levothyroxine (SYNTHROID) 100 MCG tablet, Take 1 tablet (100 mcg total) by mouth daily before breakfast., Disp: 90 tablet, Rfl: 1 .  Multiple Vitamin (MULTIVITAMIN) tablet, Take 1 tablet by mouth daily., Disp: , Rfl:  .  nitroGLYCERIN (NITROSTAT) 0.4 MG SL tablet, DISSOLVE 1 TABLET UNDER TONGUE AS NEEDED FOR CHEST PAIN,MAY REPEAT IN5 MINUTES FOR 2 DOSES., Disp: 25 tablet, Rfl: 3 .  potassium chloride SA (KLOR-CON) 20 MEQ tablet, TAKE 1 TABLET ONCE DAILY.,  Disp: 90 tablet, Rfl: 3 .  SPIRIVA HANDIHALER 18 MCG inhalation capsule, PLACE 1 CAPSULE INTO INHALER AND INHALE ONCE DAILY. (Patient taking differently: Place 18 mcg into inhaler and inhale daily. ), Disp: 90 capsule, Rfl: 0 .  temazepam (RESTORIL) 30 MG capsule, TAKE 1 CAPSULE AT BEDTIME AS NEEDED FOR SLEEP., Disp: 30 capsule, Rfl: 0 .  amoxicillin-clavulanate (AUGMENTIN) 875-125 MG tablet, Take 1 tablet by  mouth 2 (two) times daily. (Patient not taking: Reported on 12/26/2019), Disp: 20 tablet, Rfl: 0 .  glucose blood (ACCU-CHEK AVIVA PLUS) test strip, CHECK BLOOD SUGAR 4 TIMES A DAY., Disp: 100 each, Rfl: 11 .  Influenza Vac A&B Surf Ant Adj (FLUAD) 0.5 ML SUSY, , Disp: , Rfl:  .  Lancets MISC, Check blood sugar 4 times a day, Disp: 100 each, Rfl: 11 .  traMADol (ULTRAM) 50 MG tablet, Take 1 tablet (50 mg total) by mouth every 6 (six) hours as needed for moderate pain or severe pain. (Patient not taking: Reported on 01/16/2020), Disp: 20 tablet, Rfl: 0 Allergies  Allergen Reactions  . Metoprolol Shortness Of Breath, Palpitations and Other (See Comments)    Heart starts racing. Shallow breathing; pt states he also get skin irritation on his legs  . Metformin Hives and Swelling    On legs  . Metformin And Related Nausea And Vomiting      Social History   Socioeconomic History  . Marital status: Married    Spouse name: Not on file  . Number of children: Not on file  . Years of education: Not on file  . Highest education level: Not on file  Occupational History  . Occupation: Retired  Tobacco Use  . Smoking status: Former Smoker    Packs/day: 1.50    Years: 35.00    Pack years: 52.50    Types: Cigarettes  . Smokeless tobacco: Never Used  . Tobacco comment: quit atleast 25 yrs ago, per pt  Substance and Sexual Activity  . Alcohol use: Yes    Alcohol/week: 3.0 standard drinks    Types: 3 Shots of liquor per week    Comment: socially  . Drug use: No  . Sexual activity: Yes  Other Topics Concern  . Not on file  Social History Narrative   Pt lives in [place] with spouse.   Retired from [employer].  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   Social Determinants of Health   Financial Resource Strain:   . Difficulty of Paying Living Expenses: Not on file  Food Insecurity:   . Worried About Charity fundraiser in the Last Year: Not  on file  . Ran Out of Food in the Last Year: Not on file  Transportation Needs:   . Lack of Transportation (Medical): Not on file  . Lack of Transportation (Non-Medical): Not on file  Physical Activity:   . Days of Exercise per Week: Not on file  . Minutes of Exercise per Session: Not on file  Stress:   . Feeling of Stress : Not on file  Social Connections:   . Frequency of Communication with Friends and Family: Not on file  . Frequency of Social Gatherings with Friends and Family: Not on file  . Attends Religious Services: Not on file  . Active Member of Clubs or Organizations: Not on file  . Attends Archivist Meetings: Not on file  . Marital Status: Not on file  Intimate Partner Violence:   .  Fear of Current or Ex-Partner: Not on file  . Emotionally Abused: Not on file  . Physically Abused: Not on file  . Sexually Abused: Not on file    Physical Exam Cardiovascular:     Rate and Rhythm: Normal rate and regular rhythm.     Pulses: Normal pulses.  Pulmonary:     Effort: Pulmonary effort is normal.     Breath sounds: Normal breath sounds.  Musculoskeletal:        General: Normal range of motion.  Skin:    General: Skin is warm and dry.     Capillary Refill: Capillary refill takes less than 2 seconds.  Neurological:     Mental Status: He is alert and oriented to person, place, and time.  Psychiatric:        Mood and Affect: Mood normal.         Future Appointments  Date Time Provider [place]  01/31/2020 10:20 AM Larey Dresser, MD [facility] None  02/03/2020  3:45 PM [place] [facility]  02/04/2020  2:45 PM Gardiner Barefoot, DPM [facility]    BP 94/63 (BP Location: Left Arm, Patient Position: Sitting, Cuff Size: Normal)   Pulse 74   Resp 18   Wt 188 lb (85.3 kg)   SpO2 93%   BMI 28.59 kg/m   Weight yesterday- did not weigh Last visit weight- 188 lb  Mr Gerardo was seen at home today and reported feeling well.  He denied chest pain, SOB, headache, dizziness, orthopnea, fever or cough since our last visit. He stated he has been compliant with his medications over the past week and his weight has been stable. His medications were verified and his pillbox was refilled. I will follow up next week.   [name], EMT 01/23/20  ACTION: Home visit completed Next visit planned for 1 week

## 2020-01-29 DIAGNOSIS — J449 Chronic obstructive pulmonary disease, unspecified: Secondary | ICD-10-CM | POA: Diagnosis not present

## 2020-01-30 ENCOUNTER — Telehealth (HOSPITAL_COMMUNITY): Payer: Self-pay

## 2020-01-30 ENCOUNTER — Other Ambulatory Visit (HOSPITAL_COMMUNITY): Payer: Self-pay

## 2020-01-30 NOTE — Progress Notes (Signed)
Paramedicine Encounter    Patient ID: Andrew [name], male    DOB: July 29, 1928, 86 y.o.   MRN: [id number]   Patient Care Team: [name], MD as PCP - General (Family Medicine) Larey Dresser, MD as PCP - Advanced Heart Failure (Cardiology) Larey Dresser, MD as Consulting Physician (Cardiology) Jorge Ny, LCSW as Social Worker (Licensed Clinical Social Worker) Gardiner Barefoot, DPM as Consulting Physician (Podiatry)  Patient Active Problem List   Diagnosis Date Noted  . Chronic respiratory failure with hypoxia ([place]) 09/16/2018  . Bilateral recurrent inguinal hernias 11/28/2017  . Carotid artery stenosis 01/12/2016  . Chronic systolic CHF (congestive heart failure) ([place]) 12/02/2015  . 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 ([place]) 10/16/2015  . PVD (peripheral vascular disease) ([place])   . Hereditary and idiopathic peripheral neuropathy 10/02/2015  . Peripelvic (lymphatic) cyst   . Pernicious anemia 07/15/2015  . Bilateral renal cysts 07/12/2015  . Lung nodule, 39mm RML CT 07/12/15 07/12/2015  . Stage 3 chronic kidney disease 06/14/2015  . Paroxysmal atrial fibrillation ([place]) 05/28/2015  . AAA (abdominal aortic aneurysm) without rupture ([place]) 09/30/2014  . Cardiomyopathy, ischemic 05/23/2014  . DM (diabetes mellitus), type 2 with renal complications ([place]) [id number]/9381  . Depression, major, single episode, complete remission ([place]) 03/18/2010  . Hypothyroidism 02/03/2009  . Osteoarthritis 05/27/2008  . Hyperlipidemia associated with type 2 diabetes mellitus ([place]) 06/14/2007  . Hypertension associated with diabetes ([place]) 06/14/2007  . GERD 06/14/2007    Current Outpatient Medications:  .  acetaminophen (TYLENOL) 500 MG tablet, Take 1,000 mg by mouth every 6 (six) hours as needed for mild pain or headache., Disp: , Rfl:  .  amiodarone (PACERONE) 200 MG tablet, Take 0.5 tablets (100 mg total) by mouth daily., Disp: 45 tablet, Rfl: 3 .  amoxicillin-clavulanate  (AUGMENTIN) 875-125 MG tablet, Take 1 tablet by mouth 2 (two) times daily. (Patient not taking: Reported on 12/26/2019), Disp: 20 tablet, Rfl: 0 .  apixaban (ELIQUIS) 2.5 MG TABS tablet, Take 1 tablet (2.5 mg total) by mouth 2 (two) times daily., Disp: 180 tablet, Rfl: 3 .  atorvastatin (LIPITOR) 80 MG tablet, Take 1 tablet (80 mg total) by mouth daily., Disp: 30 tablet, Rfl: 5 .  carvedilol (COREG) 12.5 MG tablet, TAKE 1 TABLET BY MOUTH TWICE DAILY., Disp: 180 tablet, Rfl: 0 .  cilostazol (PLETAL) 100 MG tablet, Take 1 tablet (100 mg total) by mouth 2 (two) times daily., Disp: 180 tablet, Rfl: 3 .  ezetimibe (ZETIA) 10 MG tablet, TAKE (1) TABLET DAILY., Disp: 90 tablet, Rfl: 0 .  FLUoxetine (PROZAC) 20 MG capsule, TAKE (1) CAPSULE DAILY., Disp: 90 capsule, Rfl: 0 .  furosemide (LASIX) 80 MG tablet, TAKE 1&1/2 TABLETS IN THE MORNING AND 1 TABLET IN THE AFTERNOON., Disp: 225 tablet, Rfl: 6 .  gabapentin (NEURONTIN) 100 MG capsule, Take 100 mg by mouth 3 (three) times daily., Disp: , Rfl:  .  glucose blood (ACCU-CHEK AVIVA PLUS) test strip, CHECK BLOOD SUGAR 4 TIMES A DAY., Disp: 100 each, Rfl: 11 .  hydrALAZINE (APRESOLINE) 50 MG tablet, Take 1 tablet (50 mg total) by mouth 3 (three) times daily., Disp: 270 tablet, Rfl: 3 .  Influenza Vac A&B Surf Ant Adj (FLUAD) 0.5 ML SUSY, , Disp: , Rfl:  .  isosorbide mononitrate (IMDUR) 30 MG 24 hr tablet, TAKE 1 TABLET EACH DAY., Disp: 90 tablet, Rfl: 3 .  Lancets MISC, Check blood sugar 4 times a day, Disp: 100 each, Rfl: 11 .  levothyroxine (SYNTHROID) 100 MCG tablet, Take 1 tablet (100 mcg total) by mouth daily before breakfast., Disp: 90 tablet, Rfl: 1 .  Multiple Vitamin (MULTIVITAMIN) tablet, Take 1 tablet by mouth daily., Disp: , Rfl:  .  nitroGLYCERIN (NITROSTAT) 0.4 MG SL tablet, DISSOLVE 1 TABLET UNDER TONGUE AS NEEDED FOR CHEST PAIN,MAY REPEAT IN5 MINUTES FOR 2 DOSES., Disp: 25 tablet, Rfl: 3 .  potassium chloride SA (KLOR-CON) 20 MEQ tablet, TAKE 1  TABLET ONCE DAILY., Disp: 90 tablet, Rfl: 3 .  SPIRIVA HANDIHALER 18 MCG inhalation capsule, PLACE 1 CAPSULE INTO INHALER AND INHALE ONCE DAILY. (Patient taking differently: Place 18 mcg into inhaler and inhale daily. ), Disp: 90 capsule, Rfl: 0 .  temazepam (RESTORIL) 30 MG capsule, TAKE 1 CAPSULE AT BEDTIME AS NEEDED FOR SLEEP., Disp: 30 capsule, Rfl: 0 .  traMADol (ULTRAM) 50 MG tablet, Take 1 tablet (50 mg total) by mouth every 6 (six) hours as needed for moderate pain or severe pain. (Patient not taking: Reported on 01/16/2020), Disp: 20 tablet, Rfl: 0 Allergies  Allergen Reactions  . Metoprolol Shortness Of Breath, Palpitations and Other (See Comments)    Heart starts racing. Shallow breathing; pt states he also get skin irritation on his legs  . Metformin Hives and Swelling    On legs  . Metformin And Related Nausea And Vomiting      Social History   Socioeconomic History  . Marital status: Married    Spouse name: Not on file  . Number of children: Not on file  . Years of education: Not on file  . Highest education level: Not on file  Occupational History  . Occupation: Retired  Tobacco Use  . Smoking status: Former Smoker    Packs/day: 1.50    Years: 35.00    Pack years: 52.50    Types: Cigarettes  . Smokeless tobacco: Never Used  . Tobacco comment: quit atleast 25 yrs ago, per pt  Substance and Sexual Activity  . Alcohol use: Yes    Alcohol/week: 3.0 standard drinks    Types: 3 Shots of liquor per week    Comment: socially  . Drug use: No  . Sexual activity: Yes  Other Topics Concern  . Not on file  Social History Narrative   Pt lives in [place] with spouse.   Retired from [employer].  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   Social Determinants of Health   Financial Resource Strain:   . Difficulty of Paying Living Expenses: Not on file  Food Insecurity:   . Worried About Charity fundraiser in the Last  Year: Not on file  . Ran Out of Food in the Last Year: Not on file  Transportation Needs:   . Lack of Transportation (Medical): Not on file  . Lack of Transportation (Non-Medical): Not on file  Physical Activity:   . Days of Exercise per Week: Not on file  . Minutes of Exercise per Session: Not on file  Stress:   . Feeling of Stress : Not on file  Social Connections:   . Frequency of Communication with Friends and Family: Not on file  . Frequency of Social Gatherings with Friends and Family: Not on file  . Attends Religious Services: Not on file  . Active Member of Clubs or Organizations: Not on file  . Attends Archivist Meetings: Not on file  . Marital Status: Not on file  Intimate Partner Violence:   . Fear  of Current or Ex-Partner: Not on file  . Emotionally Abused: Not on file  . Physically Abused: Not on file  . Sexually Abused: Not on file    Physical Exam Cardiovascular:     Rate and Rhythm: Normal rate and regular rhythm.     Pulses: Normal pulses.  Pulmonary:     Effort: Pulmonary effort is normal.     Breath sounds: Normal breath sounds.  Musculoskeletal:        General: Normal range of motion.     Right lower leg: No edema.     Left lower leg: No edema.  Skin:    General: Skin is warm and dry.     Capillary Refill: Capillary refill takes less than 2 seconds.  Neurological:     Mental Status: He is alert and oriented to person, place, and time.  Psychiatric:        Mood and Affect: Mood normal.         Future Appointments  Date Time Provider [place]  01/31/2020 10:20 AM Larey Dresser, MD [facility] None  02/03/2020  3:45 PM [facility]  02/04/2020  2:45 PM Gardiner Barefoot, DPM [facility]    BP 121/69 (BP Location: Left Arm, Patient Position: Sitting, Cuff Size: Normal)   Resp 16   Wt 188 lb (85.3 kg)   SpO2 94%   BMI 28.59 kg/m   Weight yesterday- did not weigh Last visit weight- 188  lb  Andrew [name] was seen at home today and reported feeling well. He denied chest pain, SOB, headache, dizziness, orthopnea, fever or cough. He reported being compliant with his medications over the past week and his weight has been stable. His medications were verified and his pillbox was refilled. I will follow up at his appointment tomorrow at the HF clinic.  [name], EMT 01/30/20  ACTION: Home visit completed Next visit planned for 1 week

## 2020-01-30 NOTE — Telephone Encounter (Signed)

## 2020-01-31 ENCOUNTER — Ambulatory Visit (HOSPITAL_COMMUNITY)
Admission: RE | Admit: 2020-01-31 | Discharge: 2020-01-31 | Disposition: A | Discharge status: Home / Self Care | Payer: Medicare PPO | Source: Ambulatory Visit | Attending: Cardiology | Admitting: Cardiology

## 2020-01-31 ENCOUNTER — Other Ambulatory Visit: Payer: Self-pay

## 2020-01-31 VITALS — BP 106/50 | HR 88 | Wt 191.8 lb

## 2020-01-31 DIAGNOSIS — E1169 Type 2 diabetes mellitus with other specified complication: Secondary | ICD-10-CM | POA: Diagnosis not present

## 2020-01-31 DIAGNOSIS — Z87891 Personal history of nicotine dependence: Secondary | ICD-10-CM | POA: Diagnosis not present

## 2020-01-31 DIAGNOSIS — Z951 Presence of aortocoronary bypass graft: Secondary | ICD-10-CM | POA: Diagnosis not present

## 2020-01-31 DIAGNOSIS — I252 Old myocardial infarction: Secondary | ICD-10-CM | POA: Diagnosis not present

## 2020-01-31 DIAGNOSIS — I255 Ischemic cardiomyopathy: Secondary | ICD-10-CM | POA: Insufficient documentation

## 2020-01-31 DIAGNOSIS — R0602 Shortness of breath: Secondary | ICD-10-CM | POA: Insufficient documentation

## 2020-01-31 DIAGNOSIS — E1122 Type 2 diabetes mellitus with diabetic chronic kidney disease: Secondary | ICD-10-CM | POA: Diagnosis not present

## 2020-01-31 DIAGNOSIS — R2689 Other abnormalities of gait and mobility: Secondary | ICD-10-CM | POA: Insufficient documentation

## 2020-01-31 DIAGNOSIS — K219 Gastro-esophageal reflux disease without esophagitis: Secondary | ICD-10-CM | POA: Diagnosis not present

## 2020-01-31 DIAGNOSIS — Z8249 Family history of ischemic heart disease and other diseases of the circulatory system: Secondary | ICD-10-CM | POA: Insufficient documentation

## 2020-01-31 DIAGNOSIS — J449 Chronic obstructive pulmonary disease, unspecified: Secondary | ICD-10-CM | POA: Diagnosis not present

## 2020-01-31 DIAGNOSIS — I5022 Chronic systolic (congestive) heart failure: Secondary | ICD-10-CM

## 2020-01-31 DIAGNOSIS — K589 Irritable bowel syndrome without diarrhea: Secondary | ICD-10-CM | POA: Diagnosis not present

## 2020-01-31 DIAGNOSIS — E785 Hyperlipidemia, unspecified: Secondary | ICD-10-CM | POA: Diagnosis not present

## 2020-01-31 DIAGNOSIS — I4892 Unspecified atrial flutter: Secondary | ICD-10-CM | POA: Insufficient documentation

## 2020-01-31 DIAGNOSIS — Z79899 Other long term (current) drug therapy: Secondary | ICD-10-CM | POA: Diagnosis not present

## 2020-01-31 DIAGNOSIS — E039 Hypothyroidism, unspecified: Secondary | ICD-10-CM | POA: Diagnosis not present

## 2020-01-31 DIAGNOSIS — Z8673 Personal history of transient ischemic attack (TIA), and cerebral infarction without residual deficits: Secondary | ICD-10-CM | POA: Diagnosis not present

## 2020-01-31 DIAGNOSIS — R0683 Snoring: Secondary | ICD-10-CM | POA: Diagnosis not present

## 2020-01-31 DIAGNOSIS — G4739 Other sleep apnea: Secondary | ICD-10-CM | POA: Diagnosis not present

## 2020-01-31 DIAGNOSIS — I130 Hypertensive heart and chronic kidney disease with heart failure and stage 1 through stage 4 chronic kidney disease, or unspecified chronic kidney disease: Secondary | ICD-10-CM | POA: Diagnosis not present

## 2020-01-31 DIAGNOSIS — Z7901 Long term (current) use of anticoagulants: Secondary | ICD-10-CM | POA: Diagnosis not present

## 2020-01-31 DIAGNOSIS — N183 Chronic kidney disease, stage 3 unspecified: Secondary | ICD-10-CM | POA: Diagnosis not present

## 2020-01-31 DIAGNOSIS — I480 Paroxysmal atrial fibrillation: Secondary | ICD-10-CM | POA: Diagnosis not present

## 2020-01-31 DIAGNOSIS — I2510 Atherosclerotic heart disease of native coronary artery without angina pectoris: Secondary | ICD-10-CM | POA: Insufficient documentation

## 2020-01-31 DIAGNOSIS — I5042 Chronic combined systolic (congestive) and diastolic (congestive) heart failure: Secondary | ICD-10-CM | POA: Diagnosis not present

## 2020-01-31 LAB — CBC
HCT: 38.7 % — ABNORMAL LOW (ref 39.0–52.0)
Hemoglobin: 12.5 g/dL — ABNORMAL LOW (ref 13.0–17.0)
MCH: 32.5 pg (ref 26.0–34.0)
MCHC: 32.3 g/dL (ref 30.0–36.0)
MCV: 100.5 fL — ABNORMAL HIGH (ref 80.0–100.0)
Platelets: 133 10*3/uL — ABNORMAL LOW (ref 150–400)
RBC: 3.85 MIL/uL — ABNORMAL LOW (ref 4.22–5.81)
RDW: 12.8 % (ref 11.5–15.5)
WBC: 6 10*3/uL (ref 4.0–10.5)
nRBC: 0 % (ref 0.0–0.2)

## 2020-01-31 LAB — COMPREHENSIVE METABOLIC PANEL
ALT: 87 U/L — ABNORMAL HIGH (ref 0–44)
AST: 72 U/L — ABNORMAL HIGH (ref 15–41)
Albumin: 3.4 g/dL — ABNORMAL LOW (ref 3.5–5.0)
Alkaline Phosphatase: 56 U/L (ref 38–126)
Anion gap: 12 (ref 5–15)
BUN: 50 mg/dL — ABNORMAL HIGH (ref 8–23)
CO2: 26 mmol/L (ref 22–32)
Calcium: 8.7 mg/dL — ABNORMAL LOW (ref 8.9–10.3)
Chloride: 102 mmol/L (ref 98–111)
Creatinine, Ser: 1.99 mg/dL — ABNORMAL HIGH (ref 0.61–1.24)
GFR calc Af Amer: 34 mL/min — ABNORMAL LOW (ref >60)
GFR calc non Af Amer: 29 mL/min — ABNORMAL LOW (ref >60)
Glucose, Bld: 270 mg/dL — ABNORMAL HIGH (ref 70–99)
Potassium: 4 mmol/L (ref 3.5–5.1)
Sodium: 140 mmol/L (ref 135–145)
Total Bilirubin: 0.7 mg/dL (ref 0.3–1.2)
Total Protein: 6 g/dL — ABNORMAL LOW (ref 6.5–8.1)

## 2020-01-31 LAB — LIPID PANEL
Cholesterol: 113 mg/dL (ref 0–200)
HDL: 34 mg/dL — ABNORMAL LOW (ref >40)
LDL Cholesterol: 58 mg/dL (ref 0–99)
Total CHOL/HDL Ratio: 3.3 RATIO
Triglycerides: 105 mg/dL (ref <150)
VLDL: 21 mg/dL (ref 0–40)

## 2020-01-31 NOTE — Patient Instructions (Addendum)
No changes in medications today   Please call [facility] to make an appointment. [phone]   Labs today We will only contact you if something comes back abnormal or we need to make some changes. Otherwise no news is good news!   Your provider has recommended that you have a home sleep study.  [employer] is the company that does these test.  They will contact you by phone and must speak with you before they can ship the equipment.  Once they have spoken with you they will send the equipment right to your home with instructions on how to set it up.  Once you have completed the test simply box all the equipment back up and mail back to the company.  IF you have any questions or issues with the equipment please call the company directly at [phone].  If your test is positive for sleep apnea and you need a home CPAP machine you will be contacted by Dr Theodosia Blender office [facility]) to set this up.   Your physician recommends that you schedule a follow-up appointment in: 3 months with Dr [name]   Please call office at [phone] option [phone] if you have any questions or concerns.    At the [facility], you and your health needs are our priority. As part of our continuing mission to provide you with exceptional heart care, we have created designated Provider Care Teams. These Care Teams include your primary Cardiologist (physician) and Advanced Practice Providers (APPs- Physician Assistants and Nurse Practitioners) who all work together to provide you with the care you need, when you need it.   You may see any of the following providers on your designated Care Team at your next follow up: Marland Kitchen Dr Glori Bickers . Dr Loralie Champagne . Darrick Grinder, NP . Lyda Jester, PA . Audry Riles, PharmD   Please be sure to bring in all your medications bottles to every appointment.

## 2020-02-02 NOTE — Progress Notes (Signed)
Date:  02/02/2020   ID:  PIER BOSHER, DOB 1928-12-07, MRN [id number]  Provider location: [facility] Type of Visit: Established patient   PCP:  Carlyle Basques, MD  Cardiologist:  Dr. [name]  Chief Complaint: Shortness of breath   History of Present Illness: Andrew [name] is a 86 y.o. male who has a history of CKD, CAD s/p CABG, and ischemic cardiomyopathy with primarily diastolic CHF.  He has PAD followed at [facility].  I took him for [facility] in 11/15. 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  He had had occlusion of a relatively small LAD, there were right to left collaterals and no intervention was done (medical management).    He and his wife went on a cruise along the [employer] in 5/16.  He admits to considerable dietary indiscretion (high sodium diet). It appears that he developed a hypertensive crisis along with chest pain while on the ship. He went into atrial fibrillation and developed CHF.  He was taken to the hospital in [place], [place].  He was in the ICU for about a week on Bipap intermittently.  Troponin peaked at 0.09 during this admission.  He was diuresed and after about 2 wks left the hospital and was able to fly home.    Cardiolite (6/16) showed EF 42%, prior anterolateral MI, no ischemia. Echo in 10/16 with EF 40-45%.    He was admitted on 07/06/15 for RLQ pain and fever.  He was treated for UTI and had a kidney stone as well.  He developed SOB after IVF were given for AKI.  He was treated for acute COPD exacerbation as well as CHF decompensation.  He was diuresed with IV lasix.  His weight on admission was 179 and got as high as 188 after IVF. Discharge weight was 181.  He was admitted in 10/16 with a cerebellar CVA.  He has some resultant imbalance.   He was again admitted in 11/16 with acute on chronic systolic CHF.  This was a short admission that appeared to be precipitated by a sodium load from eating at a [employer] .    3/18 Echo showed stable EF 40-45%.  He was also having chest pain so I did a Cardiolite in 3/18, no ischemia.   He was admitted in 11/18 for elective hernia repair. Post-op, he developed hypoxemia and had a rehab stay.  He is now off oxygen.   Creatinine increased to 2.97 in 1/19.  I cut back on losartan and Lasix.  He developed increased lower extremity edema and we increased Lasix back to 120 qam/80 qpm.  He had a mechanical fall in 7/20 (tripped). He hit his head but refused to go to the ER.  Later in 7/20, he was admitted with fever and started on antibiotics with concern for PNA, but CT chest did not show definite PNA.  ?Viral syndrome.  COVID-19 was negative.   Echo in 8/20 showed  EF 45-50% with anterolateral hypokinesis, normal RV.   He returns for followup of CHF and CAD.  He is stable symptomatically.  Weight down 3 lbs.  No significant exertional dyspnea though he has not been very active.  No lighhteadedness.  No chest pain.  He is not walking far enough to feel claudication.  He remains in NSR.  He snores and has daytime sleepiness.   ECG (personally reviewed): NSR, 1st degree AVB, IVCD 136 msec   Labs (12/14): K 4.8,  creatinine 1.5 Labs (5/15): LDL 92 Labs (11/15): K 4.1, creatinine 1.5 Labs (12/15): K 4, creatinine 1.5 Labs (1/16): LFTs normal, LDL 51, HDL 47 Labs (5/16): TnI 0.09 Labs (6/16): K 4.8 => 4.4, creatinine 1.79 => 2.26, HCT 28.7 Labs (7/16) K 4.2, creatinine 1.98, LFTs normal, TSH normal Labs (8/16): HCT 27.1 Labs (9/16): hgb 8.1, K 4.3, creatinine 1.8, LFTs normal, TSH elevated Labs (10/16): LDL 74, HDL 45, TSH 5.19 (increased), free T3 and free T4 normal.  Labs (11/16): K 3.5, creatinine 1.47, AST 84, ALT 89 Labs (12/16): K 4.3, creatinine 1.58, AST 43, ALT 42 Labs (3/17): K 4.2, creatinine 1.4, hgb 10.7, AST 36, ALT 45 Labs (6/17): K 4, creatinine 1.69, hgb 10.9, proBNP 389, TSH mildly elevated Labs (8/17): Free T4 and T3 normal, TSH mildly  elevated, K 4.2, creatinine 1.7, LFTs normal, hgb 10.4 Labs (12/17): TSH elevated but free T3 and T4 normal, K 4.5, creatinine 1.9, LDL 45, HDL 39, LFTs normal, HCT 32.9 Labs (1/18): K 4.4, creatinine 1.93 Labs (3/18): K 4.3, creatinine 1.87, BNP 187, hgb 11.1, LDL 75, LFTs normal Labs (4/18): K 4.1, creatinine 1.7 Labs (9/18): K 4, creatinine 1.8, hgb 11.9 Labs (10/18): free T4 0.4, free T3 1.9, TSH 139  Labs (12/18): K 3.4, creatinine 1.76, hgb 12.2 Labs (1/19): K 4 => 4.9, creatinine 2.7 => 2.97, TSH 179, LFTs normal Labs (2/19): K 4.6, creatinine 2.19 Labs (4/19): LDL 61, HDL 36, K 3.9, creatinine 1.77, LFTs normal  Labs (6/19): hgb 11.7, K 3.8, creatinine 2.09, TSH 10 Labs (9/19): K 3.9, creatinine 1.69 Labs (12/19): K 3.5, creatinine 1.69, LDL 75, HDL 39, TSH normal Labs (7/20): K 3.8, creatinine 1.69 Labs (8/20): K 4, creatinine 1.63, hgb 12.1 Labs (12/20): K 3.9, creatinine 2.05, AST 44, ALT 58, hgb 12.9, TSH normal  PMH: 1. CKD stage 3 2. HTN 3. Hyperlipidemia 4. AAA: s/p surgery with left renal artery bypass in 1997 - Abdominal US 10/18 with 3.2 AAA 5. GERD 6. IBS 7. Melanoma s/p reception 8. Diabetes: Diet-controlled 9. Carotid stenosis: Carotid dopplers (4/15) with 40-59% bilateral stenosis. Carotid dopplers (7/16) with < 40% BICA stenosis.  Carotid dopplers (10/16) with 40-59% BICA stenosis.  - Carotids (8/17) with minimal disease.  - Carotids (10/18) with 1-39% BICA stenosis.  - Carotids (1/19): 1-39% BICA stenosis 10. PAD: 9/14 ABIs 0.91 right 1.09 left.  7/16 ABIs 0.86 right, 1.1 left; aortoiliac duplex with < 50% bilateral iliac stenosis.  - Aortoiliac duplex (8/17) with < 50% bilateral iliac stenosis.   - ABIs (10/17): left 0.91, right 0.98, left TBI 0.44 (abnormal).  - ABIs (7/18): right 0.97, right 0.75 - ABIs (1/19): right 1.04, left 1.03 - ABIs (7/19): right 1.13, noncompressible left ABI but TBI 0.93 11. CAD: Has super-dominant RCA. s/p CABG 1992.  He had  Taxus DES to mid PDA in 5/02. LHC (1/05) with 90% ostial PDA (had DES to this vessel), totally occluded RIMA-PDA, totally occluded PLV, sequential SVG-PLV with 1 branch occluded.  LHC (11/15) with left main off the right cusp, 40-50% ostial left main, total occlusion of the proximal LAD (small vessel) with right to left collaterals, large super-dominant RCA with 50% ISR mid RCA, 50% ostial PDA, patent SVG-PLV, RIMA-PDA known atretic. Lexiscan Cardiolite (6/16) with EF 42%, prior anterolateral MI, no ischemia.  - Lexiscan Cardiolite (3/18): EF 51%, basal to mid anterolateral fixed defect with no ischemia.  12. Ischemic cardiomyopathy: Echo (12/09) with EF 45-50%, basal to mid inferolateral hypokinesis. Echo (6/15)  with EF 45-50%, akinesis of the mid to apical inferolateral wall.  Echo (6/16) with EF 50-55%, mild LVH, inferior hypokinesis, mild MR.  Echo (10/16) with EF 40-45%, moderate LVH, mildly decreased RV systolic function.  - Echo (3/18) with EF 40-45%, mid anterior hypokinesis.  - Echo (2/19) with EF 60-65%, mild LVH, trade II diastolic dysfunction.  - Echo (8/20): EF 45-50%, anterolateral hypokinesis, normal RV size and systolic function.  13. OA 14. Atrial fibrillation/flutter: Paroxysmal.  Noted during 5/16 hospitalization in [place] and in 6/16.  He developed atrial flutter in 8/16, back in NSR by 9/16.  15. Emphysema: PFTs (8/16) with FVC 88%, FEV1 74%, ratio 81%, TLC 83%, DLCO 31%. - PFTs (12/18): FVC 64%, FEV1 69%, ratio 105%, TLC 82%, DLCO 33% => mild obstruction, severely decreased DLCO (similar to prior).   16. Renal cysts 17. Idiopathic peripheral neuropathy.  18. Anemia.  16. 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  20. Hernia repair (11/18) 21. Hypothyroidism 22. Bronchiectasis: CT chest 1/19 with bronchiectasis.  23. Lung nodule: 1.3 cm RML nodule on 1/19 CT, was noted in 2014 => slow growing. Possibly malignant.  Decision made with pulmonary to watch and avoid biopsy.  - CT chest (7/20) with  stable RML nodule, no evidence for amiodarone toxicity.  24. Suspect chronic aspiration with bronchiectasis.    Current Outpatient Medications  Medication Sig Dispense Refill  . acetaminophen (TYLENOL) 500 MG tablet Take 1,000 mg by mouth every 6 (six) hours as needed for mild pain or headache.    Marland Kitchen amiodarone (PACERONE) 200 MG tablet Take 0.5 tablets (100 mg total) by mouth daily. 45 tablet 3  . apixaban (ELIQUIS) 2.5 MG TABS tablet Take 1 tablet (2.5 mg total) by mouth 2 (two) times daily. 180 tablet 3  . atorvastatin (LIPITOR) 80 MG tablet Take 1 tablet (80 mg total) by mouth daily. 30 tablet 5  . carvedilol (COREG) 12.5 MG tablet TAKE 1 TABLET BY MOUTH TWICE DAILY. 180 tablet 0  . cilostazol (PLETAL) 100 MG tablet Take 1 tablet (100 mg total) by mouth 2 (two) times daily. 180 tablet 3  . ezetimibe (ZETIA) 10 MG tablet TAKE (1) TABLET DAILY. 90 tablet 0  . FLUoxetine (PROZAC) 20 MG capsule TAKE (1) CAPSULE DAILY. 90 capsule 0  . furosemide (LASIX) 80 MG tablet TAKE 1&1/2 TABLETS IN THE MORNING AND 1 TABLET IN THE AFTERNOON. 225 tablet 6  . gabapentin (NEURONTIN) 100 MG capsule Take 100 mg by mouth 3 (three) times daily.    Marland Kitchen glucose blood (ACCU-CHEK AVIVA PLUS) test strip CHECK BLOOD SUGAR 4 TIMES A DAY. 100 each 11  . hydrALAZINE (APRESOLINE) 50 MG tablet Take 1 tablet (50 mg total) by mouth 3 (three) times daily. 270 tablet 3  . Influenza Vac A&B Surf Ant Adj (FLUAD) 0.5 ML SUSY     . isosorbide mononitrate (IMDUR) 30 MG 24 hr tablet TAKE 1 TABLET EACH DAY. 90 tablet 3  . Lancets MISC Check blood sugar 4 times a day 100 each 11  . levothyroxine (SYNTHROID) 100 MCG tablet Take 1 tablet (100 mcg total) by mouth daily before breakfast. 90 tablet 1  . Multiple Vitamin (MULTIVITAMIN) tablet Take 1 tablet by mouth daily.    . nitroGLYCERIN (NITROSTAT) 0.4 MG SL tablet DISSOLVE 1 TABLET UNDER TONGUE AS NEEDED FOR CHEST PAIN,MAY REPEAT IN5 MINUTES FOR 2 DOSES. 25 tablet 3  . potassium chloride  SA (KLOR-CON) 20 MEQ tablet TAKE 1 TABLET ONCE DAILY. 90 tablet 3  . SPIRIVA HANDIHALER  18 MCG inhalation capsule PLACE 1 CAPSULE INTO INHALER AND INHALE ONCE DAILY. (Patient taking differently: Place 18 mcg into inhaler and inhale daily. ) 90 capsule 0  . temazepam (RESTORIL) 30 MG capsule TAKE 1 CAPSULE AT BEDTIME AS NEEDED FOR SLEEP. 30 capsule 0  . traMADol (ULTRAM) 50 MG tablet Take 1 tablet (50 mg total) by mouth every 6 (six) hours as needed for moderate pain or severe pain. 20 tablet 0  . amoxicillin-clavulanate (AUGMENTIN) 875-125 MG tablet Take 1 tablet by mouth 2 (two) times daily. (Patient not taking: Reported on 12/26/2019) 20 tablet 0   No current facility-administered medications for this encounter.    Allergies:   Metoprolol, Metformin, and Metformin and related   Social History:  The patient  reports that he has quit smoking. His smoking use included cigarettes. He has a 52.50 pack-year smoking history. He has never used smokeless tobacco. He reports current alcohol use of about 3.0 standard drinks of alcohol per week. He reports that he does not use drugs.   Family History:  The patient's family history includes Colon cancer in his father; Coronary artery disease in his brother; Diabetes in his brother and son; Heart disease in his brother; Hyperlipidemia in his brother and son; Peripheral vascular disease in his brother; Throat cancer in his brother.   ROS:  Please see the history of present illness.   All other systems are personally reviewed and negative.   Exam:   BP (!) 106/50   Pulse 88   Wt 87 kg (191 lb 12.8 oz)   SpO2 92%   BMI 29.16 kg/m  General: NAD Neck: No JVD, no thyromegaly or thyroid nodule.  Lungs: Clear to auscultation bilaterally with normal respiratory effort. CV: Nondisplaced PMI.  Heart regular S1/S2, no S3/S4, no murmur.  No peripheral edema.  No carotid bruit.  Unable to palpate pedal pulses.  Abdomen: Soft, nontender, no hepatosplenomegaly, no  distention.  Skin: Venous stasis changes lower legs Neurologic: Alert and oriented x 3.  Psych: Normal affect. Extremities: No clubbing or cyanosis.  HEENT: Normal.   Recent Labs: 07/02/2019: B Natriuretic Peptide 393.5 11/27/2019: TSH 1.062 01/31/2020: ALT 87; BUN 50; Creatinine, Ser 1.99; Hemoglobin 12.5; Platelets 133; Potassium 4.0; Sodium 140  Personally reviewed   Wt Readings from Last 3 Encounters:  01/31/20 87 kg (191 lb 12.8 oz)  01/30/20 85.3 kg (188 lb)  01/23/20 85.3 kg (188 lb)      ASSESSMENT AND PLAN:  1. CAD: s/p CABG.  Cath in 11/15 showed occluded LAD with left to right collaterals.  The LAD was a relatively small vessel (super-dominant right).  Managed medically. Lexiscan Cardiolite in 6/16 with infarction but no ischemia.  Cardiolite 3/18 with infarction, no ischemia. No chest pain.  - Continue atorvastatin, he is not on ASA given use of Eliquis.  2. Chronic primarily diastolic CHF/ischemic cardiomyopathy: EF 40-45% on 3/18 echo.  Echo 8/20 with EF 45-50%, normal RV.  NYHA II. He does not appear significantly volume overloaded on exam.  - Continue Lasix 120 qam/80 qpm with KCl.  BMET today.   - Continue hydralazine 50 mg tid.    - Continue Imdur 30 mg daily.    - Continue Coreg 12.5 mg bid.  - Not on ACEi/ARB with elevated creatinine.    3. Carotid stenosis: Stable. Due for repeat carotid dopplers, to make followup at [facility].   4. PAD: Improved claudication, ABIs had normalized when last checked.  No pedal ulcers.   -  Due for followup peripheral arterial dopplers, he will make appt at [facility].  - Continue cilostazol, unable to come off due to worsening claudication when he stops it.  5. Hyperlipidemia: Continue atorvastatin, check lipids today.   6. CKD III:  BMET today.  7. Atrial fibrillation/flutter:  Paroxysmal.  NSR today.  - Continue amiodarone 100 mg daily. Will need to get regular eye exams with amiodarone use.  CT chest in 7/20 did not appear to show evidence for  amiodarone lung toxicity.  Check LFTs today, PCP follows TSH (hypothyroid).  - Continue Eliquis 2.5 bid.  Check CBC.  8. Suspected OSA:  Arrange for home sleep study.    9. COPD: He is now off oxygen. He has bronchiectasis thought to be due to chronic aspiration. - Follows with pulmonary.  10. CVA: Cerebellar, 10/16.  He has had some resulting imbalance.  - He is no longer driving.  11. Hypertension: BP has been controlled when he takes his medications.  12. Hypothyroidism: Likely related to amiodarone. - Followed by PCP.  13. Pulmonary nodule: 1.3 cm RML nodule on 1/19 CT chest.  It was also seen in 2014 => slow growing.  Dr Lake Bells has discussed with wife and they have decided not to go forward with biopsy. They would not be interested in lung cancer treatment.  CT chest in 7/20 showed stable 1 cm RML nodule.   Signed, Loralie Champagne, MD  02/02/2020  [facility] [address] [facility] [place] [address] [phone] (office) [phone] (fax)

## 2020-02-03 ENCOUNTER — Ambulatory Visit: Payer: Medicare PPO | Attending: Internal Medicine

## 2020-02-03 DIAGNOSIS — Z23 Encounter for immunization: Secondary | ICD-10-CM | POA: Insufficient documentation

## 2020-02-03 NOTE — Progress Notes (Signed)
   Covid-19 Vaccination Clinic  Name:  Andrew [name]    MRN: [id number] DOB: 1928/12/08  02/03/2020  Mr. Selvidge was observed post Covid-19 immunization for 15 minutes without incidence. He was provided with Vaccine Information Sheet and instruction to access the V-Safe system.   Mr. Mccambridge was instructed to call 911 with any severe reactions post vaccine: Marland Kitchen Difficulty breathing  . Swelling of your face and throat  . A fast heartbeat  . A bad rash all over your body  . Dizziness and weakness    Immunizations Administered    Name Date Dose VIS Date Route   Pfizer COVID-19 Vaccine 02/03/2020  4:05 PM 0.3 mL 12/06/2019 Intramuscular   Manufacturer: [place]   Lot: [id number]   [place]: [address]-1

## 2020-02-04 ENCOUNTER — Telehealth (HOSPITAL_COMMUNITY): Payer: Self-pay

## 2020-02-04 ENCOUNTER — Ambulatory Visit: Payer: Medicare Other | Admitting: Podiatry

## 2020-02-04 NOTE — Telephone Encounter (Signed)
Order, OV note, stop bang and demographics all faxed to [facility] at [phone] via epic  

## 2020-02-06 ENCOUNTER — Other Ambulatory Visit (HOSPITAL_COMMUNITY): Payer: Self-pay

## 2020-02-06 DIAGNOSIS — D045 Carcinoma in situ of skin of trunk: Secondary | ICD-10-CM | POA: Diagnosis not present

## 2020-02-06 DIAGNOSIS — L218 Other seborrheic dermatitis: Secondary | ICD-10-CM | POA: Diagnosis not present

## 2020-02-06 DIAGNOSIS — L570 Actinic keratosis: Secondary | ICD-10-CM | POA: Diagnosis not present

## 2020-02-06 DIAGNOSIS — D220 Melanocytic nevi of lip: Secondary | ICD-10-CM | POA: Diagnosis not present

## 2020-02-06 DIAGNOSIS — C44519 Basal cell carcinoma of skin of other part of trunk: Secondary | ICD-10-CM | POA: Diagnosis not present

## 2020-02-06 DIAGNOSIS — Z85828 Personal history of other malignant neoplasm of skin: Secondary | ICD-10-CM | POA: Diagnosis not present

## 2020-02-06 DIAGNOSIS — L821 Other seborrheic keratosis: Secondary | ICD-10-CM | POA: Diagnosis not present

## 2020-02-06 DIAGNOSIS — L814 Other melanin hyperpigmentation: Secondary | ICD-10-CM | POA: Diagnosis not present

## 2020-02-06 NOTE — Progress Notes (Signed)
Paramedicine Encounter    Patient ID: Andrew [name], male    DOB: Jun 23, 1928, 86 y.o.   MRN: [id number]   Patient Care Team: [name], MD as PCP - General (Family Medicine) Larey Dresser, MD as PCP - Advanced Heart Failure (Cardiology) Larey Dresser, MD as Consulting Physician (Cardiology) Jorge Ny, LCSW as Social Worker (Licensed Clinical Social Worker) Gardiner Barefoot, DPM as Consulting Physician (Podiatry)  Patient Active Problem List   Diagnosis Date Noted  . Chronic respiratory failure with hypoxia ([place]) 09/16/2018  . Bilateral recurrent inguinal hernias 11/28/2017  . Carotid artery stenosis 01/12/2016  . Chronic systolic CHF (congestive heart failure) ([place]) 12/02/2015  . 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 ([place]) 10/16/2015  . PVD (peripheral vascular disease) ([place])   . Hereditary and idiopathic peripheral neuropathy 10/02/2015  . Peripelvic (lymphatic) cyst   . Pernicious anemia 07/15/2015  . Bilateral renal cysts 07/12/2015  . Lung nodule, 52mm RML CT 07/12/15 07/12/2015  . Stage 3 chronic kidney disease 06/14/2015  . Paroxysmal atrial fibrillation ([place]) 05/28/2015  . AAA (abdominal aortic aneurysm) without rupture ([place]) 09/30/2014  . Cardiomyopathy, ischemic 05/23/2014  . DM (diabetes mellitus), type 2 with renal complications ([place]) 28/78/6767  . Depression, major, single episode, complete remission ([place]) 03/18/2010  . Hypothyroidism 02/03/2009  . Osteoarthritis 05/27/2008  . Hyperlipidemia associated with type 2 diabetes mellitus ([place]) 06/14/2007  . Hypertension associated with diabetes ([place]) 06/14/2007  . GERD 06/14/2007    Current Outpatient Medications:  .  acetaminophen (TYLENOL) 500 MG tablet, Take 1,000 mg by mouth every 6 (six) hours as needed for mild pain or headache., Disp: , Rfl:  .  amiodarone (PACERONE) 200 MG tablet, Take 0.5 tablets (100 mg total) by mouth daily., Disp: 45 tablet, Rfl: 3 .  amoxicillin-clavulanate  (AUGMENTIN) 875-125 MG tablet, Take 1 tablet by mouth 2 (two) times daily. (Patient not taking: Reported on 12/26/2019), Disp: 20 tablet, Rfl: 0 .  apixaban (ELIQUIS) 2.5 MG TABS tablet, Take 1 tablet (2.5 mg total) by mouth 2 (two) times daily., Disp: 180 tablet, Rfl: 3 .  atorvastatin (LIPITOR) 80 MG tablet, Take 1 tablet (80 mg total) by mouth daily., Disp: 30 tablet, Rfl: 5 .  carvedilol (COREG) 12.5 MG tablet, TAKE 1 TABLET BY MOUTH TWICE DAILY., Disp: 180 tablet, Rfl: 0 .  cilostazol (PLETAL) 100 MG tablet, Take 1 tablet (100 mg total) by mouth 2 (two) times daily., Disp: 180 tablet, Rfl: 3 .  ezetimibe (ZETIA) 10 MG tablet, TAKE (1) TABLET DAILY., Disp: 90 tablet, Rfl: 0 .  FLUoxetine (PROZAC) 20 MG capsule, TAKE (1) CAPSULE DAILY., Disp: 90 capsule, Rfl: 0 .  furosemide (LASIX) 80 MG tablet, TAKE 1&1/2 TABLETS IN THE MORNING AND 1 TABLET IN THE AFTERNOON., Disp: 225 tablet, Rfl: 6 .  gabapentin (NEURONTIN) 100 MG capsule, Take 100 mg by mouth 3 (three) times daily., Disp: , Rfl:  .  glucose blood (ACCU-CHEK AVIVA PLUS) test strip, CHECK BLOOD SUGAR 4 TIMES A DAY., Disp: 100 each, Rfl: 11 .  hydrALAZINE (APRESOLINE) 50 MG tablet, Take 1 tablet (50 mg total) by mouth 3 (three) times daily., Disp: 270 tablet, Rfl: 3 .  Influenza Vac A&B Surf Ant Adj (FLUAD) 0.5 ML SUSY, , Disp: , Rfl:  .  isosorbide mononitrate (IMDUR) 30 MG 24 hr tablet, TAKE 1 TABLET EACH DAY., Disp: 90 tablet, Rfl: 3 .  Lancets MISC, Check blood sugar 4 times a day, Disp: 100 each, Rfl: 11 .  levothyroxine (SYNTHROID) 100 MCG tablet, Take 1 tablet (100 mcg total) by mouth daily before breakfast., Disp: 90 tablet, Rfl: 1 .  Multiple Vitamin (MULTIVITAMIN) tablet, Take 1 tablet by mouth daily., Disp: , Rfl:  .  nitroGLYCERIN (NITROSTAT) 0.4 MG SL tablet, DISSOLVE 1 TABLET UNDER TONGUE AS NEEDED FOR CHEST PAIN,MAY REPEAT IN5 MINUTES FOR 2 DOSES., Disp: 25 tablet, Rfl: 3 .  potassium chloride SA (KLOR-CON) 20 MEQ tablet, TAKE 1  TABLET ONCE DAILY., Disp: 90 tablet, Rfl: 3 .  SPIRIVA HANDIHALER 18 MCG inhalation capsule, PLACE 1 CAPSULE INTO INHALER AND INHALE ONCE DAILY. (Patient taking differently: Place 18 mcg into inhaler and inhale daily. ), Disp: 90 capsule, Rfl: 0 .  temazepam (RESTORIL) 30 MG capsule, TAKE 1 CAPSULE AT BEDTIME AS NEEDED FOR SLEEP., Disp: 30 capsule, Rfl: 0 .  traMADol (ULTRAM) 50 MG tablet, Take 1 tablet (50 mg total) by mouth every 6 (six) hours as needed for moderate pain or severe pain., Disp: 20 tablet, Rfl: 0 Allergies  Allergen Reactions  . Metoprolol Shortness Of Breath, Palpitations and Other (See Comments)    Heart starts racing. Shallow breathing; pt states he also get skin irritation on his legs  . Metformin Hives and Swelling    On legs  . Metformin And Related Nausea And Vomiting      Social History   Socioeconomic History  . Marital status: Married    Spouse name: Not on file  . Number of children: Not on file  . Years of education: Not on file  . Highest education level: Not on file  Occupational History  . Occupation: Retired  Tobacco Use  . Smoking status: Former Smoker    Packs/day: 1.50    Years: 35.00    Pack years: 52.50    Types: Cigarettes  . Smokeless tobacco: Never Used  . Tobacco comment: quit atleast 25 yrs ago, per pt  Substance and Sexual Activity  . Alcohol use: Yes    Alcohol/week: 3.0 standard drinks    Types: 3 Shots of liquor per week    Comment: socially  . Drug use: No  . Sexual activity: Yes  Other Topics Concern  . Not on file  Social History Narrative   Pt lives in [place] with spouse.   Retired from [employer].  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   Social Determinants of Health   Financial Resource Strain:   . Difficulty of Paying Living Expenses: Not on file  Food Insecurity:   . Worried About Charity fundraiser in the Last Year: Not on file  . Ran Out of Food in the  Last Year: Not on file  Transportation Needs:   . Lack of Transportation (Medical): Not on file  . Lack of Transportation (Non-Medical): Not on file  Physical Activity:   . Days of Exercise per Week: Not on file  . Minutes of Exercise per Session: Not on file  Stress:   . Feeling of Stress : Not on file  Social Connections:   . Frequency of Communication with Friends and Family: Not on file  . Frequency of Social Gatherings with Friends and Family: Not on file  . Attends Religious Services: Not on file  . Active Member of Clubs or Organizations: Not on file  . Attends Archivist Meetings: Not on file  . Marital Status: Not on file  Intimate Partner Violence:   . Fear of Current or Ex-Partner: Not on  file  . Emotionally Abused: Not on file  . Physically Abused: Not on file  . Sexually Abused: Not on file    Physical Exam Cardiovascular:     Rate and Rhythm: Normal rate and regular rhythm.     Pulses: Normal pulses.  Pulmonary:     Effort: Pulmonary effort is normal.     Breath sounds: Normal breath sounds.  Abdominal:     General: There is no distension.  Musculoskeletal:        General: Normal range of motion.     Right lower leg: No edema.     Left lower leg: Edema present.  Skin:    General: Skin is warm and dry.     Capillary Refill: Capillary refill takes less than 2 seconds.  Neurological:     Mental Status: He is alert and oriented to person, place, and time.  Psychiatric:        Mood and Affect: Mood normal.         Future Appointments  Date Time Provider [place]  02/10/2020  1:15 PM Gardiner Barefoot, DPM [facility]  05/05/2020 12:00 PM Larey Dresser, MD [facility] None    BP 102/69 (BP Location: Right Arm, Patient Position: Sitting, Cuff Size: Normal)   Pulse 78   Resp 16   Wt 188 lb (85.3 kg)   SpO2 92%   BMI 28.59 kg/m   Weight yesterday- did not weigh  Last visit weight- 188 lb  Mr Millikan was seen at home today  and reported feeling generally well with the exception of fatigue secondary to his COVID-19 vaccination yesterday. He denied chest pain,l SOB, headache, dizziness, orthopnea, fever of cough since our last visit. He stated he has been compliant with his medications though he missed morning afternoon and evening doses on Monday of his medications. Even with the missed doses, his weight remains stable. His medications were verified and his pillbox was refilled. I will follow up next week.   [name], EMT 02/06/20  ACTION: Home visit completed Next visit planned for 1 week

## 2020-02-10 ENCOUNTER — Ambulatory Visit: Payer: Medicare PPO | Admitting: Podiatry

## 2020-02-13 ENCOUNTER — Other Ambulatory Visit (HOSPITAL_COMMUNITY): Payer: Self-pay

## 2020-02-13 NOTE — Progress Notes (Signed)
Mr Burns Spain was in the shower today when I arrived at his house so I did not see him directly. I was met By Mrs Mccollum who stated things were well with the exception of a power outage caused by the winter weather over the weekend. The purpose of today's visit was to refill his pillbox. Due to deteriorating weather conditions a full visit was not completed. I will follow up next week.   [name], EMT 02/13/20

## 2020-02-14 ENCOUNTER — Ambulatory Visit: Payer: Medicare PPO | Admitting: Podiatry

## 2020-02-14 ENCOUNTER — Other Ambulatory Visit: Payer: Self-pay

## 2020-02-14 ENCOUNTER — Encounter: Payer: Self-pay | Admitting: Podiatry

## 2020-02-14 VITALS — Temp 97.1°F

## 2020-02-14 DIAGNOSIS — M79676 Pain in unspecified toe(s): Secondary | ICD-10-CM | POA: Diagnosis not present

## 2020-02-14 DIAGNOSIS — I739 Peripheral vascular disease, unspecified: Secondary | ICD-10-CM | POA: Diagnosis not present

## 2020-02-14 DIAGNOSIS — B351 Tinea unguium: Secondary | ICD-10-CM | POA: Diagnosis not present

## 2020-02-14 DIAGNOSIS — E1159 Type 2 diabetes mellitus with other circulatory complications: Secondary | ICD-10-CM

## 2020-02-14 NOTE — Progress Notes (Signed)
Patient ID: Andrew [name], male   DOB: 05/11/1928, 86 y.o.   MRN: [id number] Complaint:  Visit Type: Patient returns to my office for continued preventative foot care services. Complaint: Patient states" my nails have grown long and thick and become painful to walk and wear shoes" Patient has been diagnosed with DM with no foot complications. The patient presents for preventative foot care services. No changes to ROS.  Patient is on eliquiss and pletal.  Podiatric Exam: Vascular: dorsalis pedis and posterior tibial pulses are palpable bilateral. Capillary return is immediate. Temperature gradient is WNL. Skin turgor WNL  Sensorium: Normal Semmes Weinstein monofilament test. Normal tactile sensation bilaterally. Nail Exam: Pt has thick disfigured discolored nails with subungual debris noted bilateral entire nail hallux through fifth toenails Ulcer Exam: There is no evidence of ulcer or pre-ulcerative changes or infection. Orthopedic Exam: Muscle tone and strength are WNL. No limitations in general ROM. No crepitus or effusions noted. Foot type and digits show no abnormalities. Bony prominences are unremarkable. 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 Skin: No Porokeratosis. No infection or ulcers.  Asymptomatic callus right foot  Diagnosis:  Onychomycosis, , Pain in right toe, pain in left toes  Treatment & Plan Procedures and Treatment: Consent by patient was obtained for treatment procedures. The patient understood the discussion of treatment and procedures well. All questions were answered thoroughly reviewed. Debridement of mycotic and hypertrophic toenails, 1 through 5 bilateral and clearing of subungual debris. No ulceration, no infection noted.  Return Visit-Office Procedure: Patient instructed to return to the office for a follow up visit 10 weeks. for continued evaluation and treatment.   Gardiner Barefoot DPM

## 2020-02-19 ENCOUNTER — Telehealth (HOSPITAL_COMMUNITY): Payer: Self-pay

## 2020-02-19 NOTE — Telephone Encounter (Signed)
I called Mr Glymph to schedule an appointment. He did not answer so I left a message requesting he calls me back.   [name], EMT 02/19/20

## 2020-02-20 ENCOUNTER — Other Ambulatory Visit (HOSPITAL_COMMUNITY): Payer: Self-pay

## 2020-02-20 NOTE — Progress Notes (Signed)
Paramedicine Encounter    Patient ID: Andrew [name], male    DOB: 1928/05/27, 86 y.o.   MRN: [id number]   Patient Care Team: [name], MD as PCP - General (Family Medicine) Larey Dresser, MD as PCP - Advanced Heart Failure (Cardiology) Larey Dresser, MD as Consulting Physician (Cardiology) Jorge Ny, LCSW as Social Worker (Licensed Clinical Social Worker) Gardiner Barefoot, DPM as Consulting Physician (Podiatry)  Patient Active Problem List   Diagnosis Date Noted  . Chronic respiratory failure with hypoxia ([place]) 09/16/2018  . Bilateral recurrent inguinal hernias 11/28/2017  . Carotid artery stenosis 01/12/2016  . Chronic systolic CHF (congestive heart failure) ([place]) 12/02/2015  . 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 ([place]) 10/16/2015  . PVD (peripheral vascular disease) ([place])   . Hereditary and idiopathic peripheral neuropathy 10/02/2015  . Peripelvic (lymphatic) cyst   . Pernicious anemia 07/15/2015  . Bilateral renal cysts 07/12/2015  . Lung nodule, 79mm RML CT 07/12/15 07/12/2015  . Stage 3 chronic kidney disease 06/14/2015  . Paroxysmal atrial fibrillation ([place]) 05/28/2015  . AAA (abdominal aortic aneurysm) without rupture ([place]) 09/30/2014  . Cardiomyopathy, ischemic 05/23/2014  . DM (diabetes mellitus), type 2 with renal complications ([place]) 95/63/8756  . Depression, major, single episode, complete remission ([place]) 03/18/2010  . Hypothyroidism 02/03/2009  . Osteoarthritis 05/27/2008  . Hyperlipidemia associated with type 2 diabetes mellitus ([place]) 06/14/2007  . Hypertension associated with diabetes ([place]) 06/14/2007  . GERD 06/14/2007    Current Outpatient Medications:  .  amiodarone (PACERONE) 200 MG tablet, Take 0.5 tablets (100 mg total) by mouth daily., Disp: 45 tablet, Rfl: 3 .  apixaban (ELIQUIS) 2.5 MG TABS tablet, Take 1 tablet (2.5 mg total) by mouth 2 (two) times daily., Disp: 180 tablet, Rfl: 3 .  atorvastatin (LIPITOR) 80 MG tablet, Take  1 tablet (80 mg total) by mouth daily., Disp: 30 tablet, Rfl: 5 .  carvedilol (COREG) 12.5 MG tablet, TAKE 1 TABLET BY MOUTH TWICE DAILY., Disp: 180 tablet, Rfl: 0 .  cilostazol (PLETAL) 100 MG tablet, Take 1 tablet (100 mg total) by mouth 2 (two) times daily., Disp: 180 tablet, Rfl: 3 .  ezetimibe (ZETIA) 10 MG tablet, TAKE (1) TABLET DAILY., Disp: 90 tablet, Rfl: 0 .  FLUoxetine (PROZAC) 20 MG capsule, TAKE (1) CAPSULE DAILY., Disp: 90 capsule, Rfl: 0 .  furosemide (LASIX) 80 MG tablet, TAKE 1&1/2 TABLETS IN THE MORNING AND 1 TABLET IN THE AFTERNOON., Disp: 225 tablet, Rfl: 6 .  gabapentin (NEURONTIN) 100 MG capsule, Take 100 mg by mouth 3 (three) times daily., Disp: , Rfl:  .  hydrALAZINE (APRESOLINE) 50 MG tablet, Take 1 tablet (50 mg total) by mouth 3 (three) times daily., Disp: 270 tablet, Rfl: 3 .  isosorbide mononitrate (IMDUR) 30 MG 24 hr tablet, TAKE 1 TABLET EACH DAY., Disp: 90 tablet, Rfl: 3 .  levothyroxine (SYNTHROID) 100 MCG tablet, Take 1 tablet (100 mcg total) by mouth daily before breakfast., Disp: 90 tablet, Rfl: 1 .  Multiple Vitamin (MULTIVITAMIN) tablet, Take 1 tablet by mouth daily., Disp: , Rfl:  .  potassium chloride SA (KLOR-CON) 20 MEQ tablet, TAKE 1 TABLET ONCE DAILY., Disp: 90 tablet, Rfl: 3 .  SPIRIVA HANDIHALER 18 MCG inhalation capsule, PLACE 1 CAPSULE INTO INHALER AND INHALE ONCE DAILY. (Patient taking differently: Place 18 mcg into inhaler and inhale daily. ), Disp: 90 capsule, Rfl: 0 .  temazepam (RESTORIL) 30 MG capsule, TAKE 1 CAPSULE AT BEDTIME AS NEEDED FOR SLEEP., Disp:  30 capsule, Rfl: 0 .  acetaminophen (TYLENOL) 500 MG tablet, Take 1,000 mg by mouth every 6 (six) hours as needed for mild pain or headache., Disp: , Rfl:  .  glucose blood (ACCU-CHEK AVIVA PLUS) test strip, CHECK BLOOD SUGAR 4 TIMES A DAY., Disp: 100 each, Rfl: 11 .  Influenza Vac A&B Surf Ant Adj (FLUAD) 0.5 ML SUSY, , Disp: , Rfl:  .  Lancets MISC, Check blood sugar 4 times a day, Disp: 100  each, Rfl: 11 .  nitroGLYCERIN (NITROSTAT) 0.4 MG SL tablet, DISSOLVE 1 TABLET UNDER TONGUE AS NEEDED FOR CHEST PAIN,MAY REPEAT IN5 MINUTES FOR 2 DOSES., Disp: 25 tablet, Rfl: 3 .  traMADol (ULTRAM) 50 MG tablet, Take 1 tablet (50 mg total) by mouth every 6 (six) hours as needed for moderate pain or severe pain. (Patient not taking: Reported on 02/20/2020), Disp: 20 tablet, Rfl: 0 Allergies  Allergen Reactions  . Metoprolol Shortness Of Breath, Palpitations and Other (See Comments)    Heart starts racing. Shallow breathing; pt states he also get skin irritation on his legs  . Metformin Hives and Swelling    On legs  . Metformin And Related Nausea And Vomiting      Social History   Socioeconomic History  . Marital status: Married    Spouse name: Not on file  . Number of children: Not on file  . Years of education: Not on file  . Highest education level: Not on file  Occupational History  . Occupation: Retired  Tobacco Use  . Smoking status: Former Smoker    Packs/day: 1.50    Years: 35.00    Pack years: 52.50    Types: Cigarettes  . Smokeless tobacco: Never Used  . Tobacco comment: quit atleast 25 yrs ago, per pt  Substance and Sexual Activity  . Alcohol use: Yes    Alcohol/week: 3.0 standard drinks    Types: 3 Shots of liquor per week    Comment: socially  . Drug use: No  . Sexual activity: Yes  Other Topics Concern  . Not on file  Social History Narrative   Pt lives in [place] with spouse.   Retired from [employer].  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   Social Determinants of Health   Financial Resource Strain:   . Difficulty of Paying Living Expenses: Not on file  Food Insecurity:   . Worried About Charity fundraiser in the Last Year: Not on file  . Ran Out of Food in the Last Year: Not on file  Transportation Needs:   . Lack of Transportation (Medical): Not on file  . Lack of Transportation (Non-Medical):  Not on file  Physical Activity:   . Days of Exercise per Week: Not on file  . Minutes of Exercise per Session: Not on file  Stress:   . Feeling of Stress : Not on file  Social Connections:   . Frequency of Communication with Friends and Family: Not on file  . Frequency of Social Gatherings with Friends and Family: Not on file  . Attends Religious Services: Not on file  . Active Member of Clubs or Organizations: Not on file  . Attends Archivist Meetings: Not on file  . Marital Status: Not on file  Intimate Partner Violence:   . Fear of Current or Ex-Partner: Not on file  . Emotionally Abused: Not on file  . Physically Abused: Not on file  . Sexually Abused: Not  on file    Physical Exam Cardiovascular:     Rate and Rhythm: Normal rate and regular rhythm.     Pulses: Normal pulses.  Pulmonary:     Effort: Pulmonary effort is normal.     Breath sounds: Normal breath sounds.  Musculoskeletal:        General: Normal range of motion.     Right lower leg: No edema.     Left lower leg: No edema.  Skin:    General: Skin is warm and dry.     Capillary Refill: Capillary refill takes less than 2 seconds.  Neurological:     Mental Status: He is alert and oriented to person, place, and time.  Psychiatric:        Mood and Affect: Mood normal.         Future Appointments  Date Time Provider [place]  05/05/2020 12:00 PM Larey Dresser, MD [facility] None  05/15/2020 10:15 AM Gardiner Barefoot, DPM [facility]    BP (!) 90/50 (BP Location: Left Arm, Patient Position: Sitting, Cuff Size: Normal)   Pulse 71   Resp 16   Wt 186 lb (84.4 kg)   SpO2 90%   BMI 28.28 kg/m   Weight yesterday- did not weigh Last visit weight- 188 lb  Mr Marano was seen at home today and reported feeling generally well, though he did complain of intermittent RLQ abdominal pain. He is being treated for this issue by another physician and Mr Choi does not feel it needs  additional attention. He denied chest pain, SOB, headache, dizziness, orthopnea, fever or cough since our last visit. He reported being compliant with his medications and his weight has been stable. His medications were verified and his pillbox was refilled. I will follow up next week.   [name], EMT 02/20/20  ACTION: Home visit completed Next visit planned for 1 week

## 2020-02-26 ENCOUNTER — Telehealth (HOSPITAL_COMMUNITY): Payer: Self-pay

## 2020-02-26 DIAGNOSIS — J449 Chronic obstructive pulmonary disease, unspecified: Secondary | ICD-10-CM | POA: Diagnosis not present

## 2020-02-26 NOTE — Telephone Encounter (Signed)
I called Andrew [name] to schedule an appointment. He stated he was available any time tomorrow so we agreed to meet at 15:00.   [name], EMT 02/26/20

## 2020-02-27 ENCOUNTER — Other Ambulatory Visit (HOSPITAL_COMMUNITY): Payer: Self-pay

## 2020-02-27 ENCOUNTER — Other Ambulatory Visit: Payer: Self-pay | Admitting: Family Medicine

## 2020-02-27 NOTE — Progress Notes (Signed)
Paramedicine Encounter    Patient ID: Andrew [name], male    DOB: 1928-04-03, 86 y.o.   MRN: [id number]   Patient Care Team: [name], MD as PCP - General (Family Medicine) Larey Dresser, MD as PCP - Advanced Heart Failure (Cardiology) Larey Dresser, MD as Consulting Physician (Cardiology) Jorge Ny, LCSW as Social Worker (Licensed Clinical Social Worker) Gardiner Barefoot, DPM as Consulting Physician (Podiatry)  Patient Active Problem List   Diagnosis Date Noted  . Chronic respiratory failure with hypoxia ([place]) 09/16/2018  . Bilateral recurrent inguinal hernias 11/28/2017  . Carotid artery stenosis 01/12/2016  . Chronic systolic CHF (congestive heart failure) ([place]) 12/02/2015  . 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 PVD (peripheral vascular disease) ([place])   . Hereditary and idiopathic peripheral neuropathy 10/02/2015  . Peripelvic (lymphatic) cyst   . Pernicious anemia 07/15/2015  . Bilateral renal cysts 07/12/2015  . Lung nodule, 77mm RML CT 07/12/15 07/12/2015  . Stage 3 chronic kidney disease 06/14/2015  . Paroxysmal atrial fibrillation ([place]) 05/28/2015  . AAA (abdominal aortic aneurysm) without rupture ([place]) 09/30/2014  . Cardiomyopathy, ischemic 05/23/2014  . DM (diabetes mellitus), type 2 with renal complications ([place]) 00/86/7619  . Depression, major, single episode, complete remission ([place]) 03/18/2010  . Hypothyroidism 02/03/2009  . Osteoarthritis 05/27/2008  . Hyperlipidemia associated with type 2 diabetes mellitus ([place]) 06/14/2007  . Hypertension associated with diabetes ([place]) 06/14/2007  . GERD 06/14/2007    Current Outpatient Medications:  .  acetaminophen (TYLENOL) 500 MG tablet, Take 1,000 mg by mouth every 6 (six) hours as needed for mild pain or headache., Disp: , Rfl:  .  amiodarone (PACERONE) 200 MG tablet, Take 0.5 tablets (100 mg total) by mouth daily., Disp: 45 tablet, Rfl: 3 .  apixaban (ELIQUIS) 2.5 MG TABS  tablet, Take 1 tablet (2.5 mg total) by mouth 2 (two) times daily., Disp: 180 tablet, Rfl: 3 .  atorvastatin (LIPITOR) 80 MG tablet, Take 1 tablet (80 mg total) by mouth daily., Disp: 30 tablet, Rfl: 5 .  carvedilol (COREG) 12.5 MG tablet, TAKE 1 TABLET BY MOUTH TWICE DAILY., Disp: 180 tablet, Rfl: 0 .  cilostazol (PLETAL) 100 MG tablet, Take 1 tablet (100 mg total) by mouth 2 (two) times daily., Disp: 180 tablet, Rfl: 3 .  ezetimibe (ZETIA) 10 MG tablet, TAKE (1) TABLET DAILY., Disp: 90 tablet, Rfl: 0 .  FLUoxetine (PROZAC) 20 MG capsule, TAKE (1) CAPSULE DAILY., Disp: 90 capsule, Rfl: 0 .  furosemide (LASIX) 80 MG tablet, TAKE 1&1/2 TABLETS IN THE MORNING AND 1 TABLET IN THE AFTERNOON., Disp: 225 tablet, Rfl: 6 .  gabapentin (NEURONTIN) 100 MG capsule, Take 100 mg by mouth 3 (three) times daily., Disp: , Rfl:  .  glucose blood (ACCU-CHEK AVIVA PLUS) test strip, CHECK BLOOD SUGAR 4 TIMES A DAY., Disp: 100 each, Rfl: 11 .  hydrALAZINE (APRESOLINE) 50 MG tablet, Take 1 tablet (50 mg total) by mouth 3 (three) times daily., Disp: 270 tablet, Rfl: 3 .  Influenza Vac A&B Surf Ant Adj (FLUAD) 0.5 ML SUSY, , Disp: , Rfl:  .  isosorbide mononitrate (IMDUR) 30 MG 24 hr tablet, TAKE 1 TABLET EACH DAY., Disp: 90 tablet, Rfl: 3 .  Lancets MISC, Check blood sugar 4 times a day, Disp: 100 each, Rfl: 11 .  levothyroxine (SYNTHROID) 100 MCG tablet, Take 1 tablet (100 mcg total) by mouth daily before breakfast., Disp: 90 tablet, Rfl: 1 .  Multiple Vitamin (MULTIVITAMIN)  tablet, Take 1 tablet by mouth daily., Disp: , Rfl:  .  nitroGLYCERIN (NITROSTAT) 0.4 MG SL tablet, DISSOLVE 1 TABLET UNDER TONGUE AS NEEDED FOR CHEST PAIN,MAY REPEAT IN5 MINUTES FOR 2 DOSES., Disp: 25 tablet, Rfl: 3 .  potassium chloride SA (KLOR-CON) 20 MEQ tablet, TAKE 1 TABLET ONCE DAILY., Disp: 90 tablet, Rfl: 3 .  SPIRIVA HANDIHALER 18 MCG inhalation capsule, PLACE 1 CAPSULE INTO INHALER AND INHALE ONCE DAILY. (Patient taking differently: Place  18 mcg into inhaler and inhale daily. ), Disp: 90 capsule, Rfl: 0 .  temazepam (RESTORIL) 30 MG capsule, TAKE 1 CAPSULE AT BEDTIME AS NEEDED FOR SLEEP., Disp: 30 capsule, Rfl: 0 .  traMADol (ULTRAM) 50 MG tablet, Take 1 tablet (50 mg total) by mouth every 6 (six) hours as needed for moderate pain or severe pain. (Patient not taking: Reported on 02/20/2020), Disp: 20 tablet, Rfl: 0 Allergies  Allergen Reactions  . Metoprolol Shortness Of Breath, Palpitations and Other (See Comments)    Heart starts racing. Shallow breathing; pt states he also get skin irritation on his legs  . Metformin Hives and Swelling    On legs  . Metformin And Related Nausea And Vomiting      Social History   Socioeconomic History  . Marital status: Married    Spouse name: Not on file  . Number of children: Not on file  . Years of education: Not on file  . Highest education level: Not on file  Occupational History  . Occupation: Retired  Tobacco Use  . Smoking status: Former Smoker    Packs/day: 1.50    Years: 35.00    Pack years: 52.50    Types: Cigarettes  . Smokeless tobacco: Never Used  . Tobacco comment: quit atleast 25 yrs ago, per pt  Substance and Sexual Activity  . Alcohol use: Yes    Alcohol/week: 3.0 standard drinks    Types: 3 Shots of liquor per week    Comment: socially  . Drug use: No  . Sexual activity: Yes  Other Topics Concern  . Not on file  Social History Narrative   Pt lives in [place] with spouse.   Retired from [employer].  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   Social Determinants of Health   Financial Resource Strain:   . Difficulty of Paying Living Expenses: Not on file  Food Insecurity:   . Worried About Charity fundraiser in the Last Year: Not on file  . Ran Out of Food in the Last Year: Not on file  Transportation Needs:   . Lack of Transportation (Medical): Not on file  . Lack of Transportation (Non-Medical):  Not on file  Physical Activity:   . Days of Exercise per Week: Not on file  . Minutes of Exercise per Session: Not on file  Stress:   . Feeling of Stress : Not on file  Social Connections:   . Frequency of Communication with Friends and Family: Not on file  . Frequency of Social Gatherings with Friends and Family: Not on file  . Attends Religious Services: Not on file  . Active Member of Clubs or Organizations: Not on file  . Attends Archivist Meetings: Not on file  . Marital Status: Not on file  Intimate Partner Violence:   . Fear of Current or Ex-Partner: Not on file  . Emotionally Abused: Not on file  . Physically Abused: Not on file  . Sexually Abused: Not  on file    Physical Exam Cardiovascular:     Rate and Rhythm: Normal rate and regular rhythm.     Pulses: Normal pulses.  Pulmonary:     Effort: Pulmonary effort is normal.     Breath sounds: Normal breath sounds.  Abdominal:     General: There is no distension.  Musculoskeletal:        General: Normal range of motion.     Right lower leg: No edema.     Left lower leg: No edema.  Skin:    General: Skin is warm and dry.     Capillary Refill: Capillary refill takes less than 2 seconds.  Neurological:     Mental Status: He is alert and oriented to person, place, and time.  Psychiatric:        Mood and Affect: Mood normal.         Future Appointments  Date Time Provider [place]  05/05/2020 12:00 PM Larey Dresser, MD [facility] None  05/15/2020 10:15 AM Gardiner Barefoot, DPM [facility]    BP 136/60 (BP Location: Left Arm, Patient Position: Sitting, Cuff Size: Normal)   Pulse 70   Resp 16   Wt 189 lb (85.7 kg)   SpO2 92%   BMI 28.74 kg/m   Weight yesterday- did not weigh Last visit weight- 186 lb  Andrew [name] was seen at home today and reported feeling well. He denied chest pain, SOB, headache, dizziness, orthopnea, fever or cough since our last visit. He stated he has been  compliant with his medications over the past week and his weight has been stable. His medications were verified and his pillbox was refilled I will follow up next week.   [name], EMT 02/27/20  ACTION: Home visit completed Next visit planned for 1 week

## 2020-03-05 ENCOUNTER — Other Ambulatory Visit (HOSPITAL_COMMUNITY): Payer: Self-pay

## 2020-03-05 NOTE — Progress Notes (Signed)
`Paramedicine Encounter    Patient ID: Andrew [name], male    DOB: July 29, 1928, 86 y.o.   MRN: [id number]   Patient Care Team: [name], MD as PCP - General (Family Medicine) Larey Dresser, MD as PCP - Advanced Heart Failure (Cardiology) Larey Dresser, MD as Consulting Physician (Cardiology) Jorge Ny, LCSW as Social Worker (Licensed Clinical Social Worker) Gardiner Barefoot, DPM as Consulting Physician (Podiatry)  Patient Active Problem List   Diagnosis Date Noted  . Chronic respiratory failure with hypoxia ([place]) 09/16/2018  . Bilateral recurrent inguinal hernias 11/28/2017  . Carotid artery stenosis 01/12/2016  . Chronic systolic CHF (congestive heart failure) ([place]) 12/02/2015  . 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 ([place]) 10/16/2015  . PVD (peripheral vascular disease) ([place])   . Hereditary and idiopathic peripheral neuropathy 10/02/2015  . Peripelvic (lymphatic) cyst   . Pernicious anemia 07/15/2015  . Bilateral renal cysts 07/12/2015  . Lung nodule, 57mm RML CT 07/12/15 07/12/2015  . Stage 3 chronic kidney disease 06/14/2015  . Paroxysmal atrial fibrillation ([place]) 05/28/2015  . AAA (abdominal aortic aneurysm) without rupture ([place]) 09/30/2014  . Cardiomyopathy, ischemic 05/23/2014  . DM (diabetes mellitus), type 2 with renal complications ([place]) 37/09/6268  . Depression, major, single episode, complete remission ([place]) 03/18/2010  . Hypothyroidism 02/03/2009  . Osteoarthritis 05/27/2008  . Hyperlipidemia associated with type 2 diabetes mellitus ([place]) 06/14/2007  . Hypertension associated with diabetes ([place]) 06/14/2007  . GERD 06/14/2007    Current Outpatient Medications:  .  acetaminophen (TYLENOL) 500 MG tablet, Take 1,000 mg by mouth every 6 (six) hours as needed for mild pain or headache., Disp: , Rfl:  .  amiodarone (PACERONE) 200 MG tablet, Take 0.5 tablets (100 mg total) by mouth daily., Disp: 45 tablet, Rfl: 3 .  apixaban (ELIQUIS) 2.5 MG TABS  tablet, Take 1 tablet (2.5 mg total) by mouth 2 (two) times daily., Disp: 180 tablet, Rfl: 3 .  atorvastatin (LIPITOR) 80 MG tablet, Take 1 tablet (80 mg total) by mouth daily., Disp: 30 tablet, Rfl: 5 .  carvedilol (COREG) 12.5 MG tablet, TAKE 1 TABLET BY MOUTH TWICE DAILY., Disp: 180 tablet, Rfl: 0 .  cilostazol (PLETAL) 100 MG tablet, Take 1 tablet (100 mg total) by mouth 2 (two) times daily., Disp: 180 tablet, Rfl: 3 .  ezetimibe (ZETIA) 10 MG tablet, TAKE (1) TABLET DAILY., Disp: 90 tablet, Rfl: 0 .  FLUoxetine (PROZAC) 20 MG capsule, TAKE (1) CAPSULE DAILY., Disp: 90 capsule, Rfl: 0 .  furosemide (LASIX) 80 MG tablet, TAKE 1&1/2 TABLETS IN THE MORNING AND 1 TABLET IN THE AFTERNOON., Disp: 225 tablet, Rfl: 6 .  glucose blood (ACCU-CHEK AVIVA PLUS) test strip, CHECK BLOOD SUGAR 4 TIMES A DAY., Disp: 100 each, Rfl: 11 .  hydrALAZINE (APRESOLINE) 50 MG tablet, Take 1 tablet (50 mg total) by mouth 3 (three) times daily., Disp: 270 tablet, Rfl: 3 .  isosorbide mononitrate (IMDUR) 30 MG 24 hr tablet, TAKE 1 TABLET EACH DAY., Disp: 90 tablet, Rfl: 3 .  levothyroxine (SYNTHROID) 100 MCG tablet, Take 1 tablet (100 mcg total) by mouth daily before breakfast., Disp: 90 tablet, Rfl: 1 .  Multiple Vitamin (MULTIVITAMIN) tablet, Take 1 tablet by mouth daily., Disp: , Rfl:  .  potassium chloride SA (KLOR-CON) 20 MEQ tablet, TAKE 1 TABLET ONCE DAILY., Disp: 90 tablet, Rfl: 3 .  SPIRIVA HANDIHALER 18 MCG inhalation capsule, PLACE 1 CAPSULE INTO INHALER AND INHALE ONCE DAILY. (Patient taking differently: Place 18 mcg  into inhaler and inhale daily. ), Disp: 90 capsule, Rfl: 0 .  gabapentin (NEURONTIN) 100 MG capsule, Take 100 mg by mouth 3 (three) times daily., Disp: , Rfl:  .  Influenza Vac A&B Surf Ant Adj (FLUAD) 0.5 ML SUSY, , Disp: , Rfl:  .  Lancets MISC, Check blood sugar 4 times a day, Disp: 100 each, Rfl: 11 .  nitroGLYCERIN (NITROSTAT) 0.4 MG SL tablet, DISSOLVE 1 TABLET UNDER TONGUE AS NEEDED FOR CHEST  PAIN,MAY REPEAT IN5 MINUTES FOR 2 DOSES. (Patient not taking: Reported on 03/05/2020), Disp: 25 tablet, Rfl: 3 .  temazepam (RESTORIL) 30 MG capsule, TAKE 1 CAPSULE AT BEDTIME AS NEEDED FOR SLEEP. (Patient not taking: Reported on 03/05/2020), Disp: 30 capsule, Rfl: 0 .  traMADol (ULTRAM) 50 MG tablet, Take 1 tablet (50 mg total) by mouth every 6 (six) hours as needed for moderate pain or severe pain. (Patient not taking: Reported on 02/20/2020), Disp: 20 tablet, Rfl: 0 Allergies  Allergen Reactions  . Metoprolol Shortness Of Breath, Palpitations and Other (See Comments)    Heart starts racing. Shallow breathing; pt states he also get skin irritation on his legs  . Metformin Hives and Swelling    On legs  . Metformin And Related Nausea And Vomiting      Social History   Socioeconomic History  . Marital status: Married    Spouse name: Not on file  . Number of children: Not on file  . Years of education: Not on file  . Highest education level: Not on file  Occupational History  . Occupation: Retired  Tobacco Use  . Smoking status: Former Smoker    Packs/day: 1.50    Years: 35.00    Pack years: 52.50    Types: Cigarettes  . Smokeless tobacco: Never Used  . Tobacco comment: quit atleast 25 yrs ago, per pt  Substance and Sexual Activity  . Alcohol use: Yes    Alcohol/week: 3.0 standard drinks    Types: 3 Shots of liquor per week    Comment: socially  . Drug use: No  . Sexual activity: Yes  Other Topics Concern  . Not on file  Social History Narrative   Pt lives in [place] with spouse.   Retired from [employer].  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   Social Determinants of Health   Financial Resource Strain:   . Difficulty of Paying Living Expenses:   Food Insecurity:   . Worried About Charity fundraiser in the Last Year:   . Arboriculturist in the Last Year:   Transportation Needs:   . Film/video editor (Medical):    Marland Kitchen Lack of Transportation (Non-Medical):   Physical Activity:   . Days of Exercise per Week:   . Minutes of Exercise per Session:   Stress:   . Feeling of Stress :   Social Connections:   . Frequency of Communication with Friends and Family:   . Frequency of Social Gatherings with Friends and Family:   . Attends Religious Services:   . Active Member of Clubs or Organizations:   . Attends Archivist Meetings:   Marland Kitchen Marital Status:   Intimate Partner Violence:   . Fear of Current or Ex-Partner:   . Emotionally Abused:   Marland Kitchen Physically Abused:   . Sexually Abused:     Physical Exam Cardiovascular:     Rate and Rhythm: Normal rate and regular rhythm.  Pulses: Normal pulses.  Pulmonary:     Effort: Pulmonary effort is normal.     Breath sounds: Normal breath sounds.  Musculoskeletal:        General: Normal range of motion.     Right lower leg: No edema.     Left lower leg: No edema.  Skin:    General: Skin is warm and dry.     Capillary Refill: Capillary refill takes less than 2 seconds.  Neurological:     Mental Status: He is alert and oriented to person, place, and time.  Psychiatric:        Mood and Affect: Mood normal.         Future Appointments  Date Time Provider [place]  05/05/2020 12:00 PM Larey Dresser, MD [facility] None  05/15/2020 10:15 AM Gardiner Barefoot, DPM [facility]    BP (!) 110/58 (BP Location: Left Arm, Patient Position: Sitting, Cuff Size: Normal)   Pulse 66   Resp 16   Wt 186 lb (84.4 kg)   SpO2 91%   BMI 28.28 kg/m   Weight yesterday- did not weigh Last visit weight- 189 lb  Mr Beza was seen at home today and reported feeling well. He denied chest pain, SOB, headache, dizziness, orthopnea, fever or cough since our last visit. He fell yesterday while in the bathroom and bumped his head. He denied loss of consciousness and EMS was called to help him out of the floor. He did not require transportation for  evaluation at the ED. He stated he has some soreness today secondary to the fall, namely to his shoulder. He reported being compliant with his medications over the past week and his weight has been stable. His medications were verified and his pillbox was refilled. I will follow up next week.   [name], EMT 03/05/20  ACTION: Home visit completed Next visit planned for 1 week

## 2020-03-12 ENCOUNTER — Other Ambulatory Visit (HOSPITAL_COMMUNITY): Payer: Self-pay

## 2020-03-12 NOTE — Progress Notes (Signed)
Paramedicine Encounter    Patient ID: Andrew [name], male    DOB: 08/11/1928, 86 y.o.   MRN: [id number]   Patient Care Team: [name], MD as PCP - General (Family Medicine) Larey Dresser, MD as PCP - Advanced Heart Failure (Cardiology) Larey Dresser, MD as Consulting Physician (Cardiology) Jorge Ny, LCSW as Social Worker (Licensed Clinical Social Worker) Gardiner Barefoot, DPM as Consulting Physician (Podiatry)  Patient Active Problem List   Diagnosis Date Noted  . Chronic respiratory failure with hypoxia ([place]) 09/16/2018  . Bilateral recurrent inguinal hernias 11/28/2017  . Carotid artery stenosis 01/12/2016  . Chronic systolic CHF (congestive heart failure) ([place]) 12/02/2015  . 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 PVD (peripheral vascular disease) ([place])   . Hereditary and idiopathic peripheral neuropathy 10/02/2015  . Peripelvic (lymphatic) cyst   . Pernicious anemia 07/15/2015  . Bilateral renal cysts 07/12/2015  . Lung nodule, 14mm RML CT 07/12/15 07/12/2015  . Stage 3 chronic kidney disease 06/14/2015  . Paroxysmal atrial fibrillation ([place]) 05/28/2015  . AAA (abdominal aortic aneurysm) without rupture ([place]) 09/30/2014  . Cardiomyopathy, ischemic 05/23/2014  . DM (diabetes mellitus), type 2 with renal complications ([place]) 03/47/4259  . Depression, major, single episode, complete remission ([place]) 03/18/2010  . Hypothyroidism 02/03/2009  . Osteoarthritis 05/27/2008  . Hyperlipidemia associated with type 2 diabetes mellitus ([place]) 06/14/2007  . Hypertension associated with diabetes ([place]) 06/14/2007  . GERD 06/14/2007    Current Outpatient Medications:  .  acetaminophen (TYLENOL) 500 MG tablet, Take 1,000 mg by mouth every 6 (six) hours as needed for mild pain or headache., Disp: , Rfl:  .  amiodarone (PACERONE) 200 MG tablet, Take 0.5 tablets (100 mg total) by mouth daily., Disp: 45 tablet, Rfl: 3 .  apixaban (ELIQUIS) 2.5 MG TABS  tablet, Take 1 tablet (2.5 mg total) by mouth 2 (two) times daily., Disp: 180 tablet, Rfl: 3 .  atorvastatin (LIPITOR) 80 MG tablet, Take 1 tablet (80 mg total) by mouth daily., Disp: 30 tablet, Rfl: 5 .  carvedilol (COREG) 12.5 MG tablet, TAKE 1 TABLET BY MOUTH TWICE DAILY., Disp: 180 tablet, Rfl: 0 .  cilostazol (PLETAL) 100 MG tablet, Take 1 tablet (100 mg total) by mouth 2 (two) times daily., Disp: 180 tablet, Rfl: 3 .  ezetimibe (ZETIA) 10 MG tablet, TAKE (1) TABLET DAILY., Disp: 90 tablet, Rfl: 0 .  FLUoxetine (PROZAC) 20 MG capsule, TAKE (1) CAPSULE DAILY., Disp: 90 capsule, Rfl: 0 .  furosemide (LASIX) 80 MG tablet, TAKE 1&1/2 TABLETS IN THE MORNING AND 1 TABLET IN THE AFTERNOON., Disp: 225 tablet, Rfl: 6 .  gabapentin (NEURONTIN) 100 MG capsule, Take 100 mg by mouth 3 (three) times daily., Disp: , Rfl:  .  hydrALAZINE (APRESOLINE) 50 MG tablet, Take 1 tablet (50 mg total) by mouth 3 (three) times daily., Disp: 270 tablet, Rfl: 3 .  isosorbide mononitrate (IMDUR) 30 MG 24 hr tablet, TAKE 1 TABLET EACH DAY., Disp: 90 tablet, Rfl: 3 .  levothyroxine (SYNTHROID) 100 MCG tablet, Take 1 tablet (100 mcg total) by mouth daily before breakfast., Disp: 90 tablet, Rfl: 1 .  Multiple Vitamin (MULTIVITAMIN) tablet, Take 1 tablet by mouth daily., Disp: , Rfl:  .  potassium chloride SA (KLOR-CON) 20 MEQ tablet, TAKE 1 TABLET ONCE DAILY., Disp: 90 tablet, Rfl: 3 .  SPIRIVA HANDIHALER 18 MCG inhalation capsule, PLACE 1 CAPSULE INTO INHALER AND INHALE ONCE DAILY. (Patient taking differently: Place 18 mcg into  inhaler and inhale daily. ), Disp: 90 capsule, Rfl: 0 .  glucose blood (ACCU-CHEK AVIVA PLUS) test strip, CHECK BLOOD SUGAR 4 TIMES A DAY., Disp: 100 each, Rfl: 11 .  Influenza Vac A&B Surf Ant Adj (FLUAD) 0.5 ML SUSY, , Disp: , Rfl:  .  Lancets MISC, Check blood sugar 4 times a day, Disp: 100 each, Rfl: 11 .  nitroGLYCERIN (NITROSTAT) 0.4 MG SL tablet, DISSOLVE 1 TABLET UNDER TONGUE AS NEEDED FOR CHEST  PAIN,MAY REPEAT IN5 MINUTES FOR 2 DOSES. (Patient not taking: Reported on 03/05/2020), Disp: 25 tablet, Rfl: 3 .  temazepam (RESTORIL) 30 MG capsule, TAKE 1 CAPSULE AT BEDTIME AS NEEDED FOR SLEEP. (Patient not taking: Reported on 03/05/2020), Disp: 30 capsule, Rfl: 0 .  traMADol (ULTRAM) 50 MG tablet, Take 1 tablet (50 mg total) by mouth every 6 (six) hours as needed for moderate pain or severe pain. (Patient not taking: Reported on 03/12/2020), Disp: 20 tablet, Rfl: 0 Allergies  Allergen Reactions  . Metoprolol Shortness Of Breath, Palpitations and Other (See Comments)    Heart starts racing. Shallow breathing; pt states he also get skin irritation on his legs  . Metformin Hives and Swelling    On legs  . Metformin And Related Nausea And Vomiting      Social History   Socioeconomic History  . Marital status: Married    Spouse name: Not on file  . Number of children: Not on file  . Years of education: Not on file  . Highest education level: Not on file  Occupational History  . Occupation: Retired  Tobacco Use  . Smoking status: Former Smoker    Packs/day: 1.50    Years: 35.00    Pack years: 52.50    Types: Cigarettes  . Smokeless tobacco: Never Used  . Tobacco comment: quit atleast 25 yrs ago, per pt  Substance and Sexual Activity  . Alcohol use: Yes    Alcohol/week: 3.0 standard drinks    Types: 3 Shots of liquor per week    Comment: socially  . Drug use: No  . Sexual activity: Yes  Other Topics Concern  . Not on file  Social History Narrative   Pt lives in [place] with spouse.   Retired from [employer].  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   Social Determinants of Health   Financial Resource Strain:   . Difficulty of Paying Living Expenses:   Food Insecurity:   . Worried About Charity fundraiser in the Last Year:   . Arboriculturist in the Last Year:   Transportation Needs:   . Film/video editor (Medical):    Marland Kitchen Lack of Transportation (Non-Medical):   Physical Activity:   . Days of Exercise per Week:   . Minutes of Exercise per Session:   Stress:   . Feeling of Stress :   Social Connections:   . Frequency of Communication with Friends and Family:   . Frequency of Social Gatherings with Friends and Family:   . Attends Religious Services:   . Active Member of Clubs or Organizations:   . Attends Archivist Meetings:   Marland Kitchen Marital Status:   Intimate Partner Violence:   . Fear of Current or Ex-Partner:   . Emotionally Abused:   Marland Kitchen Physically Abused:   . Sexually Abused:     Physical Exam Cardiovascular:     Rate and Rhythm: Normal rate and regular rhythm.  Pulses: Normal pulses.  Pulmonary:     Effort: Pulmonary effort is normal.     Breath sounds: Normal breath sounds.  Musculoskeletal:        General: Normal range of motion.     Right lower leg: No edema.     Left lower leg: No edema.  Skin:    General: Skin is warm and dry.     Capillary Refill: Capillary refill takes less than 2 seconds.  Neurological:     Mental Status: He is alert and oriented to person, place, and time.  Psychiatric:        Mood and Affect: Mood normal.         Future Appointments  Date Time Provider [place]  05/05/2020 12:00 PM Larey Dresser, MD [facility] None  05/15/2020 10:15 AM Gardiner Barefoot, DPM [facility]    BP (!) 160/80 (BP Location: Left Arm, Patient Position: Sitting, Cuff Size: Normal)   Pulse 71   Resp 16   Wt 184 lb (83.5 kg)   SpO2 93%   BMI 27.98 kg/m   Weight yesterday- did not weigh Last visit weight- 186 lb  Mr Siefert was seen at home today and reported feeling well. He denied chest pain, SOB, headache, dizziness, fever or cough since our last visit. He stated he has been compliant with his medications over the past week and his weight has been stable. He was found to be hypertensive today but had yet to take his morning medications. His  medications were verified and his pillbox was refilled. I will follow up next week.   [name], EMT 03/12/20  ACTION: Home visit completed Next visit planned for 1 week

## 2020-03-17 DIAGNOSIS — Z85828 Personal history of other malignant neoplasm of skin: Secondary | ICD-10-CM | POA: Diagnosis not present

## 2020-03-17 DIAGNOSIS — C44519 Basal cell carcinoma of skin of other part of trunk: Secondary | ICD-10-CM | POA: Diagnosis not present

## 2020-03-19 ENCOUNTER — Other Ambulatory Visit (HOSPITAL_COMMUNITY): Payer: Self-pay

## 2020-03-19 NOTE — Progress Notes (Signed)
Paramedicine Encounter    Patient ID: Andrew [name], male    DOB: September 17, 1928, 86 y.o.   MRN: [id number]   Patient Care Team: [name], MD as PCP - General (Family Medicine) Larey Dresser, MD as PCP - Advanced Heart Failure (Cardiology) Larey Dresser, MD as Consulting Physician (Cardiology) Jorge Ny, LCSW as Social Worker (Licensed Clinical Social Worker) Gardiner Barefoot, DPM as Consulting Physician (Podiatry)  Patient Active Problem List   Diagnosis Date Noted  . Chronic respiratory failure with hypoxia ([place]) 09/16/2018  . Bilateral recurrent inguinal hernias 11/28/2017  . Carotid artery stenosis 01/12/2016  . Chronic systolic CHF (congestive heart failure) ([place]) 12/02/2015  . 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 PVD (peripheral vascular disease) ([place])   . Hereditary and idiopathic peripheral neuropathy 10/02/2015  . Peripelvic (lymphatic) cyst   . Pernicious anemia 07/15/2015  . Bilateral renal cysts 07/12/2015  . Lung nodule, 65mm RML CT 07/12/15 07/12/2015  . Stage 3 chronic kidney disease 06/14/2015  . Paroxysmal atrial fibrillation ([place]) 05/28/2015  . AAA (abdominal aortic aneurysm) without rupture ([place]) 09/30/2014  . Cardiomyopathy, ischemic 05/23/2014  . DM (diabetes mellitus), type 2 with renal complications ([place]) 65/99/3570  . Depression, major, single episode, complete remission ([place]) 03/18/2010  . Hypothyroidism 02/03/2009  . Osteoarthritis 05/27/2008  . Hyperlipidemia associated with type 2 diabetes mellitus ([place]) 06/14/2007  . Hypertension associated with diabetes ([place]) 06/14/2007  . GERD 06/14/2007    Current Outpatient Medications:  .  acetaminophen (TYLENOL) 500 MG tablet, Take 1,000 mg by mouth every 6 (six) hours as needed for mild pain or headache., Disp: , Rfl:  .  amiodarone (PACERONE) 200 MG tablet, Take 0.5 tablets (100 mg total) by mouth daily., Disp: 45 tablet, Rfl: 3 .  apixaban (ELIQUIS) 2.5 MG TABS  tablet, Take 1 tablet (2.5 mg total) by mouth 2 (two) times daily., Disp: 180 tablet, Rfl: 3 .  atorvastatin (LIPITOR) 80 MG tablet, Take 1 tablet (80 mg total) by mouth daily., Disp: 30 tablet, Rfl: 5 .  carvedilol (COREG) 12.5 MG tablet, TAKE 1 TABLET BY MOUTH TWICE DAILY., Disp: 180 tablet, Rfl: 0 .  cilostazol (PLETAL) 100 MG tablet, Take 1 tablet (100 mg total) by mouth 2 (two) times daily., Disp: 180 tablet, Rfl: 3 .  ezetimibe (ZETIA) 10 MG tablet, TAKE (1) TABLET DAILY., Disp: 90 tablet, Rfl: 0 .  FLUoxetine (PROZAC) 20 MG capsule, TAKE (1) CAPSULE DAILY., Disp: 90 capsule, Rfl: 0 .  furosemide (LASIX) 80 MG tablet, TAKE 1&1/2 TABLETS IN THE MORNING AND 1 TABLET IN THE AFTERNOON., Disp: 225 tablet, Rfl: 6 .  gabapentin (NEURONTIN) 100 MG capsule, Take 100 mg by mouth 3 (three) times daily., Disp: , Rfl:  .  glucose blood (ACCU-CHEK AVIVA PLUS) test strip, CHECK BLOOD SUGAR 4 TIMES A DAY., Disp: 100 each, Rfl: 11 .  hydrALAZINE (APRESOLINE) 50 MG tablet, Take 1 tablet (50 mg total) by mouth 3 (three) times daily., Disp: 270 tablet, Rfl: 3 .  isosorbide mononitrate (IMDUR) 30 MG 24 hr tablet, TAKE 1 TABLET EACH DAY., Disp: 90 tablet, Rfl: 3 .  levothyroxine (SYNTHROID) 100 MCG tablet, Take 1 tablet (100 mcg total) by mouth daily before breakfast., Disp: 90 tablet, Rfl: 1 .  Multiple Vitamin (MULTIVITAMIN) tablet, Take 1 tablet by mouth daily., Disp: , Rfl:  .  potassium chloride SA (KLOR-CON) 20 MEQ tablet, TAKE 1 TABLET ONCE DAILY., Disp: 90 tablet, Rfl: 3 .  Influenza  Vac A&B Surf Ant Adj (FLUAD) 0.5 ML SUSY, , Disp: , Rfl:  .  Lancets MISC, Check blood sugar 4 times a day, Disp: 100 each, Rfl: 11 .  nitroGLYCERIN (NITROSTAT) 0.4 MG SL tablet, DISSOLVE 1 TABLET UNDER TONGUE AS NEEDED FOR CHEST PAIN,MAY REPEAT IN5 MINUTES FOR 2 DOSES. (Patient not taking: Reported on 03/19/2020), Disp: 25 tablet, Rfl: 3 .  SPIRIVA HANDIHALER 18 MCG inhalation capsule, PLACE 1 CAPSULE INTO INHALER AND INHALE  ONCE DAILY. (Patient not taking: No sig reported), Disp: 90 capsule, Rfl: 0 .  temazepam (RESTORIL) 30 MG capsule, TAKE 1 CAPSULE AT BEDTIME AS NEEDED FOR SLEEP. (Patient not taking: Reported on 03/05/2020), Disp: 30 capsule, Rfl: 0 .  traMADol (ULTRAM) 50 MG tablet, Take 1 tablet (50 mg total) by mouth every 6 (six) hours as needed for moderate pain or severe pain. (Patient not taking: Reported on 03/12/2020), Disp: 20 tablet, Rfl: 0 Allergies  Allergen Reactions  . Metoprolol Shortness Of Breath, Palpitations and Other (See Comments)    Heart starts racing. Shallow breathing; pt states he also get skin irritation on his legs  . Metformin Hives and Swelling    On legs  . Metformin And Related Nausea And Vomiting      Social History   Socioeconomic History  . Marital status: Married    Spouse name: Not on file  . Number of children: Not on file  . Years of education: Not on file  . Highest education level: Not on file  Occupational History  . Occupation: Retired  Tobacco Use  . Smoking status: Former Smoker    Packs/day: 1.50    Years: 35.00    Pack years: 52.50    Types: Cigarettes  . Smokeless tobacco: Never Used  . Tobacco comment: quit atleast 25 yrs ago, per pt  Substance and Sexual Activity  . Alcohol use: Yes    Alcohol/week: 3.0 standard drinks    Types: 3 Shots of liquor per week    Comment: socially  . Drug use: No  . Sexual activity: Yes  Other Topics Concern  . Not on file  Social History Narrative   Pt lives in [place] with spouse.   Retired from [employer].  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   Social Determinants of Health   Financial Resource Strain:   . Difficulty of Paying Living Expenses:   Food Insecurity:   . Worried About Charity fundraiser in the Last Year:   . Arboriculturist in the Last Year:   Transportation Needs:   . Film/video editor (Medical):   Marland Kitchen Lack of Transportation  (Non-Medical):   Physical Activity:   . Days of Exercise per Week:   . Minutes of Exercise per Session:   Stress:   . Feeling of Stress :   Social Connections:   . Frequency of Communication with Friends and Family:   . Frequency of Social Gatherings with Friends and Family:   . Attends Religious Services:   . Active Member of Clubs or Organizations:   . Attends Archivist Meetings:   Marland Kitchen Marital Status:   Intimate Partner Violence:   . Fear of Current or Ex-Partner:   . Emotionally Abused:   Marland Kitchen Physically Abused:   . Sexually Abused:     Physical Exam Cardiovascular:     Rate and Rhythm: Normal rate and regular rhythm.     Pulses: Normal pulses.  Pulmonary:  Effort: Pulmonary effort is normal.     Breath sounds: Normal breath sounds.  Musculoskeletal:        General: Normal range of motion.     Right lower leg: No edema.     Left lower leg: No edema.  Skin:    General: Skin is warm and dry.     Capillary Refill: Capillary refill takes less than 2 seconds.  Neurological:     Mental Status: He is alert and oriented to person, place, and time.  Psychiatric:        Mood and Affect: Mood normal.         Future Appointments  Date Time Provider [place]  05/05/2020 12:00 PM Larey Dresser, MD [facility] None  05/15/2020 10:15 AM Gardiner Barefoot, DPM [facility]    BP 118/70 (BP Location: Left Arm, Patient Position: Sitting, [name] Size: Normal)   Pulse 70   Resp 16   Wt 183 lb (83 kg)   SpO2 93%   BMI 27.83 kg/m   Weight yesterday- did not weigh Last visit weight- 184 lb  Mr Lazarz was seen at home today and reported feeling well. He denied chest pain, SOB, headache, dizziness, orthopnea, fever or cough over the past week. He stated he has been compliant with his medications over the past week and his weight has been stable. His medications were verified and his pillbox was refilled. 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 Nothing further was needed at this time but I will return tomorrow with better dressing supplies for them to use going forward.   [name], EMT 03/19/20  ACTION: Home visit completed Next visit planned for tomorrow

## 2020-03-23 ENCOUNTER — Telehealth: Payer: Self-pay | Admitting: Family Medicine

## 2020-03-23 NOTE — Telephone Encounter (Signed)
[facility] with letter stating he does not need home oxygen.  Algis Greenhouse. Jerline Pain, MD 03/23/2020 4:02 PM

## 2020-03-23 NOTE — Telephone Encounter (Signed)
Pt called stating he still has some medical equipment in his house from when he was in the hospital (ex: oxygen tank). States he does not use the equipment anymore but is still getting billed every month for it. Pt states the agency, [employer], will not come pick up the equipment and stop charging for it until they receive a letter from his PCP stating he no longer medically needs the equipment. Pt is requesting Dr. Jerline Pain fax a letter to the agency so they can take the equipment away. Fax number is [phone].

## 2020-03-23 NOTE — Telephone Encounter (Signed)
Please advise 

## 2020-03-24 ENCOUNTER — Encounter: Payer: Self-pay | Admitting: Family Medicine

## 2020-03-24 NOTE — Telephone Encounter (Signed)
Letter faxed.

## 2020-03-26 ENCOUNTER — Other Ambulatory Visit (HOSPITAL_COMMUNITY): Payer: Self-pay

## 2020-03-26 NOTE — Progress Notes (Signed)
Paramedicine Encounter    Patient ID: Andrew [name], male    DOB: 15-Nov-1928, 86 y.o.   MRN: [id number]   Patient Care Team: [name], MD as PCP - General (Family Medicine) Larey Dresser, MD as PCP - Advanced Heart Failure (Cardiology) Larey Dresser, MD as Consulting Physician (Cardiology) Jorge Ny, LCSW as Social Worker (Licensed Clinical Social Worker) Gardiner Barefoot, DPM as Consulting Physician (Podiatry)  Patient Active Problem List   Diagnosis Date Noted  . Chronic respiratory failure with hypoxia ([place]) 09/16/2018  . Bilateral recurrent inguinal hernias 11/28/2017  . Carotid artery stenosis 01/12/2016  . Chronic systolic CHF (congestive heart failure) ([place]) 12/02/2015  . 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 ([place]) 10/16/2015  . PVD (peripheral vascular disease) ([place])   . Hereditary and idiopathic peripheral neuropathy 10/02/2015  . Peripelvic (lymphatic) cyst   . Pernicious anemia 07/15/2015  . Bilateral renal cysts 07/12/2015  . Lung nodule, 46mm RML CT 07/12/15 07/12/2015  . Stage 3 chronic kidney disease 06/14/2015  . Paroxysmal atrial fibrillation ([place]) 05/28/2015  . AAA (abdominal aortic aneurysm) without rupture ([place]) 09/30/2014  . Cardiomyopathy, ischemic 05/23/2014  . DM (diabetes mellitus), type 2 with renal complications ([place]) 17/51/0258  . Depression, major, single episode, complete remission ([place]) 03/18/2010  . Hypothyroidism 02/03/2009  . Osteoarthritis 05/27/2008  . Hyperlipidemia associated with type 2 diabetes mellitus ([place]) 06/14/2007  . Hypertension associated with diabetes ([place]) 06/14/2007  . GERD 06/14/2007    Current Outpatient Medications:  .  acetaminophen (TYLENOL) 500 MG tablet, Take 1,000 mg by mouth every 6 (six) hours as needed for mild pain or headache., Disp: , Rfl:  .  amiodarone (PACERONE) 200 MG tablet, Take 0.5 tablets (100 mg total) by mouth daily., Disp: 45 tablet, Rfl: 3 .  apixaban (ELIQUIS) 2.5 MG TABS  tablet, Take 1 tablet (2.5 mg total) by mouth 2 (two) times daily., Disp: 180 tablet, Rfl: 3 .  atorvastatin (LIPITOR) 80 MG tablet, Take 1 tablet (80 mg total) by mouth daily., Disp: 30 tablet, Rfl: 5 .  carvedilol (COREG) 12.5 MG tablet, TAKE 1 TABLET BY MOUTH TWICE DAILY., Disp: 180 tablet, Rfl: 0 .  cilostazol (PLETAL) 100 MG tablet, Take 1 tablet (100 mg total) by mouth 2 (two) times daily., Disp: 180 tablet, Rfl: 3 .  ezetimibe (ZETIA) 10 MG tablet, TAKE (1) TABLET DAILY., Disp: 90 tablet, Rfl: 0 .  FLUoxetine (PROZAC) 20 MG capsule, TAKE (1) CAPSULE DAILY., Disp: 90 capsule, Rfl: 0 .  furosemide (LASIX) 80 MG tablet, TAKE 1&1/2 TABLETS IN THE MORNING AND 1 TABLET IN THE AFTERNOON., Disp: 225 tablet, Rfl: 6 .  gabapentin (NEURONTIN) 100 MG capsule, Take 100 mg by mouth 3 (three) times daily., Disp: , Rfl:  .  hydrALAZINE (APRESOLINE) 50 MG tablet, Take 1 tablet (50 mg total) by mouth 3 (three) times daily., Disp: 270 tablet, Rfl: 3 .  isosorbide mononitrate (IMDUR) 30 MG 24 hr tablet, TAKE 1 TABLET EACH DAY., Disp: 90 tablet, Rfl: 3 .  levothyroxine (SYNTHROID) 100 MCG tablet, Take 1 tablet (100 mcg total) by mouth daily before breakfast., Disp: 90 tablet, Rfl: 1 .  Multiple Vitamin (MULTIVITAMIN) tablet, Take 1 tablet by mouth daily., Disp: , Rfl:  .  potassium chloride SA (KLOR-CON) 20 MEQ tablet, TAKE 1 TABLET ONCE DAILY., Disp: 90 tablet, Rfl: 3 .  SPIRIVA HANDIHALER 18 MCG inhalation capsule, PLACE 1 CAPSULE INTO INHALER AND INHALE ONCE DAILY., Disp: 90 capsule, Rfl: 0 .  glucose blood (ACCU-CHEK AVIVA PLUS) test strip, CHECK BLOOD SUGAR 4 TIMES A DAY., Disp: 100 each, Rfl: 11 .  Influenza Vac A&B Surf Ant Adj (FLUAD) 0.5 ML SUSY, , Disp: , Rfl:  .  Lancets MISC, Check blood sugar 4 times a day, Disp: 100 each, Rfl: 11 .  nitroGLYCERIN (NITROSTAT) 0.4 MG SL tablet, DISSOLVE 1 TABLET UNDER TONGUE AS NEEDED FOR CHEST PAIN,MAY REPEAT IN5 MINUTES FOR 2 DOSES. (Patient not taking: Reported on  03/19/2020), Disp: 25 tablet, Rfl: 3 .  temazepam (RESTORIL) 30 MG capsule, TAKE 1 CAPSULE AT BEDTIME AS NEEDED FOR SLEEP. (Patient not taking: Reported on 03/05/2020), Disp: 30 capsule, Rfl: 0 .  traMADol (ULTRAM) 50 MG tablet, Take 1 tablet (50 mg total) by mouth every 6 (six) hours as needed for moderate pain or severe pain. (Patient not taking: Reported on 03/12/2020), Disp: 20 tablet, Rfl: 0 Allergies  Allergen Reactions  . Metoprolol Shortness Of Breath, Palpitations and Other (See Comments)    Heart starts racing. Shallow breathing; pt states he also get skin irritation on his legs  . Metformin Hives and Swelling    On legs  . Metformin And Related Nausea And Vomiting      Social History   Socioeconomic History  . Marital status: Married    Spouse name: Not on file  . Number of children: Not on file  . Years of education: Not on file  . Highest education level: Not on file  Occupational History  . Occupation: Retired  Tobacco Use  . Smoking status: Former Smoker    Packs/day: 1.50    Years: 35.00    Pack years: 52.50    Types: Cigarettes  . Smokeless tobacco: Never Used  . Tobacco comment: quit atleast 25 yrs ago, per pt  Substance and Sexual Activity  . Alcohol use: Yes    Alcohol/week: 3.0 standard drinks    Types: 3 Shots of liquor per week    Comment: socially  . Drug use: No  . Sexual activity: Yes  Other Topics Concern  . Not on file  Social History Narrative   Pt lives in [place] with spouse.   Retired from [employer].  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   Social Determinants of Health   Financial Resource Strain:   . Difficulty of Paying Living Expenses:   Food Insecurity:   . Worried About Charity fundraiser in the Last Year:   . Arboriculturist in the Last Year:   Transportation Needs:   . Film/video editor (Medical):   Marland Kitchen Lack of Transportation (Non-Medical):   Physical Activity:   .  Days of Exercise per Week:   . Minutes of Exercise per Session:   Stress:   . Feeling of Stress :   Social Connections:   . Frequency of Communication with Friends and Family:   . Frequency of Social Gatherings with Friends and Family:   . Attends Religious Services:   . Active Member of Clubs or Organizations:   . Attends Archivist Meetings:   Marland Kitchen Marital Status:   Intimate Partner Violence:   . Fear of Current or Ex-Partner:   . Emotionally Abused:   Marland Kitchen Physically Abused:   . Sexually Abused:     Physical Exam Cardiovascular:     Rate and Rhythm: Normal rate and regular rhythm.     Pulses: Normal pulses.  Pulmonary:     Effort: Pulmonary effort  is normal.     Breath sounds: Normal breath sounds.  Musculoskeletal:        General: Normal range of motion.     Right lower leg: No edema.     Left lower leg: No edema.  Skin:    General: Skin is warm and dry.     Capillary Refill: Capillary refill takes less than 2 seconds.  Neurological:     Mental Status: He is alert and oriented to person, place, and time.  Psychiatric:        Mood and Affect: Mood normal.         Future Appointments  Date Time Provider [place]  05/05/2020 12:00 PM Larey Dresser, MD [facility] None  05/15/2020 10:15 AM Gardiner Barefoot, DPM [facility]    BP 136/60 (BP Location: Left Arm, Patient Position: Sitting, Cuff Size: Normal)   Pulse 70   Resp 16   Wt 188 lb (85.3 kg)   SpO2 90%   BMI 28.59 kg/m   Weight yesterday- did not weigh Last visit weight- 183 lb  Mr Moffa was seen at home today and reported feeling well. He denied chest pain, SOB, headache, dizziness, orthopnea, fever or cough in the past week. He stated he has bee compliant with his medications however he missed 2 afternoons and one evening round of medications. His weight remains stable. His medications were verified and his pillbox was refilled. I will follow up next week.   [name],  EMT 03/26/20  ACTION: Home visit completed Next visit planned for 1 week

## 2020-04-02 ENCOUNTER — Other Ambulatory Visit (HOSPITAL_COMMUNITY): Payer: Self-pay

## 2020-04-02 NOTE — Progress Notes (Signed)
Paramedicine Encounter    Patient ID: KAMRYN GAUTHIER, male    DOB: October 31, 1928, 86 y.o.   MRN: [id number]   Patient Care Team: [name], MD as PCP - General (Family Medicine) Larey Dresser, MD as PCP - Advanced Heart Failure (Cardiology) Larey Dresser, MD as Consulting Physician (Cardiology) Jorge Ny, LCSW as Social Worker (Licensed Clinical Social Worker) Gardiner Barefoot, DPM as Consulting Physician (Podiatry)  Patient Active Problem List   Diagnosis Date Noted  . Chronic respiratory failure with hypoxia ([place]) 09/16/2018  . Bilateral recurrent inguinal hernias 11/28/2017  . Carotid artery stenosis 01/12/2016  . Chronic systolic CHF (congestive heart failure) ([place]) 12/02/2015  . 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 PVD (peripheral vascular disease) ([place])   . Hereditary and idiopathic peripheral neuropathy 10/02/2015  . Peripelvic (lymphatic) cyst   . Pernicious anemia 07/15/2015  . Bilateral renal cysts 07/12/2015  . Lung nodule, 25mm RML CT 07/12/15 07/12/2015  . Stage 3 chronic kidney disease 06/14/2015  . Paroxysmal atrial fibrillation ([place]) 05/28/2015  . AAA (abdominal aortic aneurysm) without rupture ([place]) 09/30/2014  . Cardiomyopathy, ischemic 05/23/2014  . DM (diabetes mellitus), type 2 with renal complications ([place]) 25/36/6440  . Depression, major, single episode, complete remission ([place]) 03/18/2010  . Hypothyroidism 02/03/2009  . Osteoarthritis 05/27/2008  . Hyperlipidemia associated with type 2 diabetes mellitus ([place]) 06/14/2007  . Hypertension associated with diabetes ([place]) 06/14/2007  . GERD 06/14/2007    Current Outpatient Medications:  .  acetaminophen (TYLENOL) 500 MG tablet, Take 1,000 mg by mouth every 6 (six) hours as needed for mild pain or headache., Disp: , Rfl:  .  amiodarone (PACERONE) 200 MG tablet, Take 0.5 tablets (100 mg total) by mouth daily., Disp: 45 tablet, Rfl: 3 .  apixaban (ELIQUIS) 2.5 MG TABS  tablet, Take 1 tablet (2.5 mg total) by mouth 2 (two) times daily., Disp: 180 tablet, Rfl: 3 .  atorvastatin (LIPITOR) 80 MG tablet, Take 1 tablet (80 mg total) by mouth daily., Disp: 30 tablet, Rfl: 5 .  carvedilol (COREG) 12.5 MG tablet, TAKE 1 TABLET BY MOUTH TWICE DAILY., Disp: 180 tablet, Rfl: 0 .  cilostazol (PLETAL) 100 MG tablet, Take 1 tablet (100 mg total) by mouth 2 (two) times daily., Disp: 180 tablet, Rfl: 3 .  ezetimibe (ZETIA) 10 MG tablet, TAKE (1) TABLET DAILY., Disp: 90 tablet, Rfl: 0 .  FLUoxetine (PROZAC) 20 MG capsule, TAKE (1) CAPSULE DAILY., Disp: 90 capsule, Rfl: 0 .  furosemide (LASIX) 80 MG tablet, TAKE 1&1/2 TABLETS IN THE MORNING AND 1 TABLET IN THE AFTERNOON., Disp: 225 tablet, Rfl: 6 .  gabapentin (NEURONTIN) 100 MG capsule, Take 100 mg by mouth 3 (three) times daily., Disp: , Rfl:  .  hydrALAZINE (APRESOLINE) 50 MG tablet, Take 1 tablet (50 mg total) by mouth 3 (three) times daily., Disp: 270 tablet, Rfl: 3 .  isosorbide mononitrate (IMDUR) 30 MG 24 hr tablet, TAKE 1 TABLET EACH DAY., Disp: 90 tablet, Rfl: 3 .  Lancets MISC, Check blood sugar 4 times a day, Disp: 100 each, Rfl: 11 .  levothyroxine (SYNTHROID) 100 MCG tablet, Take 1 tablet (100 mcg total) by mouth daily before breakfast., Disp: 90 tablet, Rfl: 1 .  Multiple Vitamin (MULTIVITAMIN) tablet, Take 1 tablet by mouth daily., Disp: , Rfl:  .  potassium chloride SA (KLOR-CON) 20 MEQ tablet, TAKE 1 TABLET ONCE DAILY., Disp: 90 tablet, Rfl: 3 .  SPIRIVA HANDIHALER 18 MCG inhalation capsule,  PLACE 1 CAPSULE INTO INHALER AND INHALE ONCE DAILY., Disp: 90 capsule, Rfl: 0 .  glucose blood (ACCU-CHEK AVIVA PLUS) test strip, CHECK BLOOD SUGAR 4 TIMES A DAY. (Patient not taking: Reported on 04/02/2020), Disp: 100 each, Rfl: 11 .  Influenza Vac A&B Surf Ant Adj (FLUAD) 0.5 ML SUSY, , Disp: , Rfl:  .  nitroGLYCERIN (NITROSTAT) 0.4 MG SL tablet, DISSOLVE 1 TABLET UNDER TONGUE AS NEEDED FOR CHEST PAIN,MAY REPEAT IN5 MINUTES FOR  2 DOSES. (Patient not taking: Reported on 03/19/2020), Disp: 25 tablet, Rfl: 3 .  temazepam (RESTORIL) 30 MG capsule, TAKE 1 CAPSULE AT BEDTIME AS NEEDED FOR SLEEP. (Patient not taking: Reported on 03/05/2020), Disp: 30 capsule, Rfl: 0 .  traMADol (ULTRAM) 50 MG tablet, Take 1 tablet (50 mg total) by mouth every 6 (six) hours as needed for moderate pain or severe pain. (Patient not taking: Reported on 03/12/2020), Disp: 20 tablet, Rfl: 0 Allergies  Allergen Reactions  . Metoprolol Shortness Of Breath, Palpitations and Other (See Comments)    Heart starts racing. Shallow breathing; pt states he also get skin irritation on his legs  . Metformin Hives and Swelling    On legs  . Metformin And Related Nausea And Vomiting      Social History   Socioeconomic History  . Marital status: Married    Spouse name: Not on file  . Number of children: Not on file  . Years of education: Not on file  . Highest education level: Not on file  Occupational History  . Occupation: Retired  Tobacco Use  . Smoking status: Former Smoker    Packs/day: 1.50    Years: 35.00    Pack years: 52.50    Types: Cigarettes  . Smokeless tobacco: Never Used  . Tobacco comment: quit atleast 25 yrs ago, per pt  Substance and Sexual Activity  . Alcohol use: Yes    Alcohol/week: 3.0 standard drinks    Types: 3 Shots of liquor per week    Comment: socially  . Drug use: No  . Sexual activity: Yes  Other Topics Concern  . Not on file  Social History Narrative   Pt lives in [place] with spouse.   Retired from [employer].  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   Social Determinants of Health   Financial Resource Strain:   . Difficulty of Paying Living Expenses:   Food Insecurity:   . Worried About Charity fundraiser in the Last Year:   . Arboriculturist in the Last Year:   Transportation Needs:   . Film/video editor (Medical):   Marland Kitchen Lack of Transportation  (Non-Medical):   Physical Activity:   . Days of Exercise per Week:   . Minutes of Exercise per Session:   Stress:   . Feeling of Stress :   Social Connections:   . Frequency of Communication with Friends and Family:   . Frequency of Social Gatherings with Friends and Family:   . Attends Religious Services:   . Active Member of Clubs or Organizations:   . Attends Archivist Meetings:   Marland Kitchen Marital Status:   Intimate Partner Violence:   . Fear of Current or Ex-Partner:   . Emotionally Abused:   Marland Kitchen Physically Abused:   . Sexually Abused:     Physical Exam Cardiovascular:     Rate and Rhythm: Normal rate and regular rhythm.     Pulses: Normal pulses.  Pulmonary:  Effort: Pulmonary effort is normal.     Breath sounds: Normal breath sounds.  Musculoskeletal:        General: Normal range of motion.     Right lower leg: No edema.     Left lower leg: No edema.  Skin:    General: Skin is warm and dry.     Capillary Refill: Capillary refill takes less than 2 seconds.  Neurological:     Mental Status: He is alert and oriented to person, place, and time.  Psychiatric:        Mood and Affect: Mood normal.         Future Appointments  Date Time Provider [place]  05/05/2020 12:00 PM Larey Dresser, MD [facility] None  05/15/2020 10:15 AM Gardiner Barefoot, DPM [facility]    BP 110/60 (BP Location: Left Arm, Patient Position: Sitting, Cuff Size: Normal)   Pulse 78   Resp 16   Wt 190 lb (86.2 kg)   SpO2 95%   BMI 28.89 kg/m   Weight yesterday- did not weigh Last visit weight- 188 lb  Mr Wiemers was seen at home today and rpeorted feeling generally well. He denied chest pain, SOB, headache, dizziness, orthopnea, fever or cough since our last visit. He stated he has been mostly compliant with his medications, admitting to missing a few evenings due to forgetfulness. His weight also remains stable. He did complain of low back pain which is sharp in  nature and began several days ago. He denied any injury which could be the cause for pain and also said it is positional. I suggested he reach out to his PCP about this and he was agreeable. His medications were verified and his pillbox was refilled. I will follow up next week.   [name], EMT 04/02/20  ACTION: Home visit completed Next visit planned for 1 week

## 2020-04-09 ENCOUNTER — Other Ambulatory Visit (HOSPITAL_COMMUNITY): Payer: Self-pay | Admitting: *Deleted

## 2020-04-09 ENCOUNTER — Other Ambulatory Visit (HOSPITAL_COMMUNITY): Payer: Self-pay

## 2020-04-09 MED ORDER — CARVEDILOL 12.5 MG PO TABS: 12.5000 mg | ORAL_TABLET | Freq: Two times a day (BID) | ORAL | 3 refills | Status: DC

## 2020-04-09 NOTE — Progress Notes (Signed)
Paramedicine Encounter    Patient ID: Andrew [name], male    DOB: August 20, 1928, 86 y.o.   MRN: [id number]   Patient Care Team: [name], MD as PCP - General (Family Medicine) Larey Dresser, MD as PCP - Advanced Heart Failure (Cardiology) Larey Dresser, MD as Consulting Physician (Cardiology) Jorge Ny, LCSW as Social Worker (Licensed Clinical Social Worker) Gardiner Barefoot, DPM as Consulting Physician (Podiatry)  Patient Active Problem List   Diagnosis Date Noted  . Chronic respiratory failure with hypoxia ([place]) 09/16/2018  . Bilateral recurrent inguinal hernias 11/28/2017  . Carotid artery stenosis 01/12/2016  . Chronic systolic CHF (congestive heart failure) ([place]) 12/02/2015  . 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 ([place]) 10/16/2015  . PVD (peripheral vascular disease) ([place])   . Hereditary and idiopathic peripheral neuropathy 10/02/2015  . Peripelvic (lymphatic) cyst   . Pernicious anemia 07/15/2015  . Bilateral renal cysts 07/12/2015  . Lung nodule, 24mm RML CT 07/12/15 07/12/2015  . Stage 3 chronic kidney disease 06/14/2015  . Paroxysmal atrial fibrillation ([place]) 05/28/2015  . AAA (abdominal aortic aneurysm) without rupture ([place]) 09/30/2014  . Cardiomyopathy, ischemic 05/23/2014  . DM (diabetes mellitus), type 2 with renal complications ([place]) 51/76/1607  . Depression, major, single episode, complete remission ([place]) 03/18/2010  . Hypothyroidism 02/03/2009  . Osteoarthritis 05/27/2008  . Hyperlipidemia associated with type 2 diabetes mellitus ([place]) 06/14/2007  . Hypertension associated with diabetes ([place]) 06/14/2007  . GERD 06/14/2007    Current Outpatient Medications:  .  acetaminophen (TYLENOL) 500 MG tablet, Take 1,000 mg by mouth every 6 (six) hours as needed for mild pain or headache., Disp: , Rfl:  .  amiodarone (PACERONE) 200 MG tablet, Take 0.5 tablets (100 mg total) by mouth daily., Disp: 45 tablet, Rfl: 3 .  apixaban (ELIQUIS) 2.5 MG TABS  tablet, Take 1 tablet (2.5 mg total) by mouth 2 (two) times daily., Disp: 180 tablet, Rfl: 3 .  atorvastatin (LIPITOR) 80 MG tablet, Take 1 tablet (80 mg total) by mouth daily., Disp: 30 tablet, Rfl: 5 .  cilostazol (PLETAL) 100 MG tablet, Take 1 tablet (100 mg total) by mouth 2 (two) times daily., Disp: 180 tablet, Rfl: 3 .  ezetimibe (ZETIA) 10 MG tablet, TAKE (1) TABLET DAILY., Disp: 90 tablet, Rfl: 0 .  FLUoxetine (PROZAC) 20 MG capsule, TAKE (1) CAPSULE DAILY., Disp: 90 capsule, Rfl: 0 .  furosemide (LASIX) 80 MG tablet, TAKE 1&1/2 TABLETS IN THE MORNING AND 1 TABLET IN THE AFTERNOON., Disp: 225 tablet, Rfl: 6 .  gabapentin (NEURONTIN) 100 MG capsule, Take 100 mg by mouth 3 (three) times daily., Disp: , Rfl:  .  hydrALAZINE (APRESOLINE) 50 MG tablet, Take 1 tablet (50 mg total) by mouth 3 (three) times daily., Disp: 270 tablet, Rfl: 3 .  isosorbide mononitrate (IMDUR) 30 MG 24 hr tablet, TAKE 1 TABLET EACH DAY., Disp: 90 tablet, Rfl: 3 .  levothyroxine (SYNTHROID) 100 MCG tablet, Take 1 tablet (100 mcg total) by mouth daily before breakfast., Disp: 90 tablet, Rfl: 1 .  Multiple Vitamin (MULTIVITAMIN) tablet, Take 1 tablet by mouth daily., Disp: , Rfl:  .  potassium chloride SA (KLOR-CON) 20 MEQ tablet, TAKE 1 TABLET ONCE DAILY., Disp: 90 tablet, Rfl: 3 .  SPIRIVA HANDIHALER 18 MCG inhalation capsule, PLACE 1 CAPSULE INTO INHALER AND INHALE ONCE DAILY., Disp: 90 capsule, Rfl: 0 .  carvedilol (COREG) 12.5 MG tablet, Take 1 tablet (12.5 mg total) by mouth 2 (two) times daily., Disp: 180  tablet, Rfl: 3 .  glucose blood (ACCU-CHEK AVIVA PLUS) test strip, CHECK BLOOD SUGAR 4 TIMES A DAY. (Patient not taking: Reported on 04/02/2020), Disp: 100 each, Rfl: 11 .  Influenza Vac A&B Surf Ant Adj (FLUAD) 0.5 ML SUSY, , Disp: , Rfl:  .  Lancets MISC, Check blood sugar 4 times a day, Disp: 100 each, Rfl: 11 .  nitroGLYCERIN (NITROSTAT) 0.4 MG SL tablet, DISSOLVE 1 TABLET UNDER TONGUE AS NEEDED FOR CHEST  PAIN,MAY REPEAT IN5 MINUTES FOR 2 DOSES. (Patient not taking: Reported on 03/19/2020), Disp: 25 tablet, Rfl: 3 .  temazepam (RESTORIL) 30 MG capsule, TAKE 1 CAPSULE AT BEDTIME AS NEEDED FOR SLEEP. (Patient not taking: Reported on 03/05/2020), Disp: 30 capsule, Rfl: 0 .  traMADol (ULTRAM) 50 MG tablet, Take 1 tablet (50 mg total) by mouth every 6 (six) hours as needed for moderate pain or severe pain. (Patient not taking: Reported on 03/12/2020), Disp: 20 tablet, Rfl: 0 Allergies  Allergen Reactions  . Metoprolol Shortness Of Breath, Palpitations and Other (See Comments)    Heart starts racing. Shallow breathing; pt states he also get skin irritation on his legs  . Metformin Hives and Swelling    On legs  . Metformin And Related Nausea And Vomiting      Social History   Socioeconomic History  . Marital status: Married    Spouse name: Not on file  . Number of children: Not on file  . Years of education: Not on file  . Highest education level: Not on file  Occupational History  . Occupation: Retired  Tobacco Use  . Smoking status: Former Smoker    Packs/day: 1.50    Years: 35.00    Pack years: 52.50    Types: Cigarettes  . Smokeless tobacco: Never Used  . Tobacco comment: quit atleast 25 yrs ago, per pt  Substance and Sexual Activity  . Alcohol use: Yes    Alcohol/week: 3.0 standard drinks    Types: 3 Shots of liquor per week    Comment: socially  . Drug use: No  . Sexual activity: Yes  Other Topics Concern  . Not on file  Social History Narrative   Pt lives in [place] with spouse.   Retired from [employer].  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   Social Determinants of Health   Financial Resource Strain:   . Difficulty of Paying Living Expenses:   Food Insecurity:   . Worried About Charity fundraiser in the Last Year:   . Arboriculturist in the Last Year:   Transportation Needs:   . Film/video editor (Medical):    Marland Kitchen Lack of Transportation (Non-Medical):   Physical Activity:   . Days of Exercise per Week:   . Minutes of Exercise per Session:   Stress:   . Feeling of Stress :   Social Connections:   . Frequency of Communication with Friends and Family:   . Frequency of Social Gatherings with Friends and Family:   . Attends Religious Services:   . Active Member of Clubs or Organizations:   . Attends Archivist Meetings:   Marland Kitchen Marital Status:   Intimate Partner Violence:   . Fear of Current or Ex-Partner:   . Emotionally Abused:   Marland Kitchen Physically Abused:   . Sexually Abused:     Physical Exam Cardiovascular:     Rate and Rhythm: Normal rate and regular rhythm.     Pulses:  Normal pulses.  Pulmonary:     Effort: Pulmonary effort is normal.     Breath sounds: Normal breath sounds.  Musculoskeletal:        General: Normal range of motion.     Right lower leg: No edema.     Left lower leg: No edema.  Skin:    General: Skin is warm and dry.     Capillary Refill: Capillary refill takes less than 2 seconds.  Neurological:     Mental Status: He is alert and oriented to person, place, and time.  Psychiatric:        Mood and Affect: Mood normal.         Future Appointments  Date Time Provider [place]  05/05/2020 12:00 PM Larey Dresser, MD [facility] None  05/15/2020 10:15 AM Gardiner Barefoot, DPM [facility]    BP 126/62 (BP Location: Left Arm, Patient Position: Sitting, Cuff Size: Normal)   Pulse 70   Resp 16   Wt 188 lb (85.3 kg)   SpO2 91%   BMI 28.59 kg/m   Weight yesterday- did not weigh Last visit weight- 190 lb  Mr Friedlander was seen at home today and reported feeling well. He denied chest pain, SOB, headache, dizziness, orthopnea, fever or cough since our last visit. His only complaint today is ongoing low back pain however he is unwilling to speak to his PCP for this. He stated he has been compliant with his medications and his weight remains stable.  His medications were verified and his pillbox was refilled.   [name], EMT 04/09/20  ACTION: Home visit completed Next visit planned for 1 week

## 2020-04-16 ENCOUNTER — Other Ambulatory Visit (HOSPITAL_COMMUNITY): Payer: Self-pay

## 2020-04-16 NOTE — Progress Notes (Signed)
Paramedicine Encounter    Patient ID: Andrew [name], male    DOB: March 23, 1928, 86 y.o.   MRN: [id number]   Patient Care Team: [name], MD as PCP - General (Family Medicine) Larey Dresser, MD as PCP - Advanced Heart Failure (Cardiology) Larey Dresser, MD as Consulting Physician (Cardiology) Jorge Ny, LCSW as Social Worker (Licensed Clinical Social Worker) Gardiner Barefoot, DPM as Consulting Physician (Podiatry)  Patient Active Problem List   Diagnosis Date Noted  . Chronic respiratory failure with hypoxia ([place]) 09/16/2018  . Bilateral recurrent inguinal hernias 11/28/2017  . Carotid artery stenosis 01/12/2016  . Chronic systolic CHF (congestive heart failure) ([place]) 12/02/2015  . 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 ([place]) 10/16/2015  . PVD (peripheral vascular disease) ([place])   . Hereditary and idiopathic peripheral neuropathy 10/02/2015  . Peripelvic (lymphatic) cyst   . Pernicious anemia 07/15/2015  . Bilateral renal cysts 07/12/2015  . Lung nodule, 61mm RML CT 07/12/15 07/12/2015  . Stage 3 chronic kidney disease 06/14/2015  . Paroxysmal atrial fibrillation ([place]) 05/28/2015  . AAA (abdominal aortic aneurysm) without rupture ([place]) 09/30/2014  . Cardiomyopathy, ischemic 05/23/2014  . DM (diabetes mellitus), type 2 with renal complications ([place]) 53/66/4403  . Depression, major, single episode, complete remission ([place]) 03/18/2010  . Hypothyroidism 02/03/2009  . Osteoarthritis 05/27/2008  . Hyperlipidemia associated with type 2 diabetes mellitus ([place]) 06/14/2007  . Hypertension associated with diabetes ([place]) 06/14/2007  . GERD 06/14/2007    Current Outpatient Medications:  .  acetaminophen (TYLENOL) 500 MG tablet, Take 1,000 mg by mouth every 6 (six) hours as needed for mild pain or headache., Disp: , Rfl:  .  amiodarone (PACERONE) 200 MG tablet, Take 0.5 tablets (100 mg total) by mouth daily., Disp: 45 tablet, Rfl: 3 .  apixaban (ELIQUIS) 2.5 MG TABS  tablet, Take 1 tablet (2.5 mg total) by mouth 2 (two) times daily., Disp: 180 tablet, Rfl: 3 .  atorvastatin (LIPITOR) 80 MG tablet, Take 1 tablet (80 mg total) by mouth daily., Disp: 30 tablet, Rfl: 5 .  carvedilol (COREG) 12.5 MG tablet, Take 1 tablet (12.5 mg total) by mouth 2 (two) times daily., Disp: 180 tablet, Rfl: 3 .  cilostazol (PLETAL) 100 MG tablet, Take 1 tablet (100 mg total) by mouth 2 (two) times daily., Disp: 180 tablet, Rfl: 3 .  ezetimibe (ZETIA) 10 MG tablet, TAKE (1) TABLET DAILY., Disp: 90 tablet, Rfl: 0 .  FLUoxetine (PROZAC) 20 MG capsule, TAKE (1) CAPSULE DAILY., Disp: 90 capsule, Rfl: 0 .  furosemide (LASIX) 80 MG tablet, TAKE 1&1/2 TABLETS IN THE MORNING AND 1 TABLET IN THE AFTERNOON., Disp: 225 tablet, Rfl: 6 .  gabapentin (NEURONTIN) 100 MG capsule, Take 100 mg by mouth 3 (three) times daily., Disp: , Rfl:  .  hydrALAZINE (APRESOLINE) 50 MG tablet, Take 1 tablet (50 mg total) by mouth 3 (three) times daily., Disp: 270 tablet, Rfl: 3 .  isosorbide mononitrate (IMDUR) 30 MG 24 hr tablet, TAKE 1 TABLET EACH DAY., Disp: 90 tablet, Rfl: 3 .  levothyroxine (SYNTHROID) 100 MCG tablet, Take 1 tablet (100 mcg total) by mouth daily before breakfast., Disp: 90 tablet, Rfl: 1 .  Multiple Vitamin (MULTIVITAMIN) tablet, Take 1 tablet by mouth daily., Disp: , Rfl:  .  potassium chloride SA (KLOR-CON) 20 MEQ tablet, TAKE 1 TABLET ONCE DAILY., Disp: 90 tablet, Rfl: 3 .  SPIRIVA HANDIHALER 18 MCG inhalation capsule, PLACE 1 CAPSULE INTO INHALER AND INHALE ONCE DAILY., Disp: 90  capsule, Rfl: 0 .  glucose blood (ACCU-CHEK AVIVA PLUS) test strip, CHECK BLOOD SUGAR 4 TIMES A DAY. (Patient not taking: Reported on 04/02/2020), Disp: 100 each, Rfl: 11 .  Influenza Vac A&B Surf Ant Adj (FLUAD) 0.5 ML SUSY, , Disp: , Rfl:  .  Lancets MISC, Check blood sugar 4 times a day, Disp: 100 each, Rfl: 11 .  nitroGLYCERIN (NITROSTAT) 0.4 MG SL tablet, DISSOLVE 1 TABLET UNDER TONGUE AS NEEDED FOR CHEST  PAIN,MAY REPEAT IN5 MINUTES FOR 2 DOSES. (Patient not taking: Reported on 03/19/2020), Disp: 25 tablet, Rfl: 3 .  temazepam (RESTORIL) 30 MG capsule, TAKE 1 CAPSULE AT BEDTIME AS NEEDED FOR SLEEP., Disp: 30 capsule, Rfl: 0 .  traMADol (ULTRAM) 50 MG tablet, Take 1 tablet (50 mg total) by mouth every 6 (six) hours as needed for moderate pain or severe pain. (Patient not taking: Reported on 04/16/2020), Disp: 20 tablet, Rfl: 0 Allergies  Allergen Reactions  . Metoprolol Shortness Of Breath, Palpitations and Other (See Comments)    Heart starts racing. Shallow breathing; pt states he also get skin irritation on his legs  . Metformin Hives and Swelling    On legs  . Metformin And Related Nausea And Vomiting      Social History   Socioeconomic History  . Marital status: Married    Spouse name: Not on file  . Number of children: Not on file  . Years of education: Not on file  . Highest education level: Not on file  Occupational History  . Occupation: Retired  Tobacco Use  . Smoking status: Former Smoker    Packs/day: 1.50    Years: 35.00    Pack years: 52.50    Types: Cigarettes  . Smokeless tobacco: Never Used  . Tobacco comment: quit atleast 25 yrs ago, per pt  Substance and Sexual Activity  . Alcohol use: Yes    Alcohol/week: 3.0 standard drinks    Types: 3 Shots of liquor per week    Comment: socially  . Drug use: No  . Sexual activity: Yes  Other Topics Concern  . Not on file  Social History Narrative   Pt lives in [place] with spouse.   Retired from [employer].  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   Social Determinants of Health   Financial Resource Strain:   . Difficulty of Paying Living Expenses:   Food Insecurity:   . Worried About Charity fundraiser in the Last Year:   . Arboriculturist in the Last Year:   Transportation Needs:   . Film/video editor (Medical):   Marland Kitchen Lack of Transportation (Non-Medical):    Physical Activity:   . Days of Exercise per Week:   . Minutes of Exercise per Session:   Stress:   . Feeling of Stress :   Social Connections:   . Frequency of Communication with Friends and Family:   . Frequency of Social Gatherings with Friends and Family:   . Attends Religious Services:   . Active Member of Clubs or Organizations:   . Attends Archivist Meetings:   Marland Kitchen Marital Status:   Intimate Partner Violence:   . Fear of Current or Ex-Partner:   . Emotionally Abused:   Marland Kitchen Physically Abused:   . Sexually Abused:     Physical Exam Cardiovascular:     Rate and Rhythm: Normal rate and regular rhythm.     Pulses: Normal pulses.  Pulmonary:  Effort: Pulmonary effort is normal.     Breath sounds: Normal breath sounds.  Musculoskeletal:        General: Normal range of motion.     Right lower leg: No edema.     Left lower leg: No edema.  Skin:    General: Skin is warm and dry.     Capillary Refill: Capillary refill takes less than 2 seconds.  Neurological:     Mental Status: He is alert and oriented to person, place, and time.  Psychiatric:        Mood and Affect: Mood normal.         Future Appointments  Date Time Provider [place]  05/05/2020 12:00 PM Larey Dresser, MD [facility] None  05/15/2020 10:15 AM Gardiner Barefoot, DPM [facility]    BP 124/78 (BP Location: Left Arm, Patient Position: Sitting, Cuff Size: Normal)   Pulse 68   Resp 16   Wt 190 lb 3.2 oz (86.3 kg)   SpO2 91%   BMI 28.92 kg/m   Weight yesterday- did not weigh Last visit weight- 188 lb  Mr Ewbank was seen at home today and reported feeling well. He denied chest pain, SOB, headache, dizziness, orthopnea, fever or cough over the past week. He reported being compliant with his medications over the past week and his weight has been stable. His medications were verified and his pillbox was refilled. I will follow up next week.   [name],  EMT 04/16/20  ACTION: Home visit completed Next visit planned for 1 week

## 2020-04-23 ENCOUNTER — Other Ambulatory Visit (HOSPITAL_COMMUNITY): Payer: Self-pay | Admitting: Cardiology

## 2020-04-23 ENCOUNTER — Other Ambulatory Visit (HOSPITAL_COMMUNITY): Payer: Self-pay

## 2020-04-23 NOTE — Progress Notes (Signed)
Paramedicine Encounter    Patient ID: AWAB ABEBE, male    DOB: May 06, 1928, 86 y.o.   MRN: [id number]   Patient Care Team: [name], MD as PCP - General (Family Medicine) Larey Dresser, MD as PCP - Advanced Heart Failure (Cardiology) Larey Dresser, MD as Consulting Physician (Cardiology) Jorge Ny, LCSW as Social Worker (Licensed Clinical Social Worker) Gardiner Barefoot, DPM as Consulting Physician (Podiatry)  Patient Active Problem List   Diagnosis Date Noted  . Chronic respiratory failure with hypoxia ([place]) 09/16/2018  . Bilateral recurrent inguinal hernias 11/28/2017  . Carotid artery stenosis 01/12/2016  . Chronic systolic CHF (congestive heart failure) ([place]) 12/02/2015  . 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 PVD (peripheral vascular disease) ([place])   . Hereditary and idiopathic peripheral neuropathy 10/02/2015  . Peripelvic (lymphatic) cyst   . Pernicious anemia 07/15/2015  . Bilateral renal cysts 07/12/2015  . Lung nodule, 24mm RML CT 07/12/15 07/12/2015  . Stage 3 chronic kidney disease 06/14/2015  . Paroxysmal atrial fibrillation ([place]) 05/28/2015  . AAA (abdominal aortic aneurysm) without rupture ([place]) 09/30/2014  . Cardiomyopathy, ischemic 05/23/2014  . DM (diabetes mellitus), type 2 with renal complications ([place]) 12/75/1700  . Depression, major, single episode, complete remission ([place]) 03/18/2010  . Hypothyroidism 02/03/2009  . Osteoarthritis 05/27/2008  . Hyperlipidemia associated with type 2 diabetes mellitus ([place]) 06/14/2007  . Hypertension associated with diabetes ([place]) 06/14/2007  . GERD 06/14/2007    Current Outpatient Medications:  .  acetaminophen (TYLENOL) 500 MG tablet, Take 1,000 mg by mouth every 6 (six) hours as needed for mild pain or headache., Disp: , Rfl:  .  amiodarone (PACERONE) 200 MG tablet, Take 0.5 tablets (100 mg total) by mouth daily., Disp: 45 tablet, Rfl: 3 .  apixaban (ELIQUIS) 2.5 MG TABS  tablet, Take 1 tablet (2.5 mg total) by mouth 2 (two) times daily., Disp: 180 tablet, Rfl: 3 .  atorvastatin (LIPITOR) 80 MG tablet, Take 1 tablet (80 mg total) by mouth daily., Disp: 30 tablet, Rfl: 5 .  carvedilol (COREG) 12.5 MG tablet, Take 1 tablet (12.5 mg total) by mouth 2 (two) times daily., Disp: 180 tablet, Rfl: 3 .  cilostazol (PLETAL) 100 MG tablet, Take 1 tablet (100 mg total) by mouth 2 (two) times daily., Disp: 180 tablet, Rfl: 3 .  ezetimibe (ZETIA) 10 MG tablet, TAKE (1) TABLET DAILY., Disp: 90 tablet, Rfl: 0 .  FLUoxetine (PROZAC) 20 MG capsule, TAKE (1) CAPSULE DAILY., Disp: 90 capsule, Rfl: 0 .  furosemide (LASIX) 80 MG tablet, TAKE 1&1/2 TABLETS IN THE MORNING AND 1 TABLET IN THE AFTERNOON., Disp: 225 tablet, Rfl: 6 .  gabapentin (NEURONTIN) 100 MG capsule, Take 100 mg by mouth 3 (three) times daily., Disp: , Rfl:  .  hydrALAZINE (APRESOLINE) 50 MG tablet, Take 1 tablet (50 mg total) by mouth 3 (three) times daily., Disp: 270 tablet, Rfl: 3 .  isosorbide mononitrate (IMDUR) 30 MG 24 hr tablet, TAKE 1 TABLET EACH DAY., Disp: 90 tablet, Rfl: 3 .  levothyroxine (SYNTHROID) 100 MCG tablet, Take 1 tablet (100 mcg total) by mouth daily before breakfast., Disp: 90 tablet, Rfl: 1 .  Multiple Vitamin (MULTIVITAMIN) tablet, Take 1 tablet by mouth daily., Disp: , Rfl:  .  potassium chloride SA (KLOR-CON) 20 MEQ tablet, TAKE 1 TABLET ONCE DAILY., Disp: 90 tablet, Rfl: 3 .  SPIRIVA HANDIHALER 18 MCG inhalation capsule, PLACE 1 CAPSULE INTO INHALER AND INHALE ONCE DAILY., Disp: 90  capsule, Rfl: 0 .  glucose blood (ACCU-CHEK AVIVA PLUS) test strip, CHECK BLOOD SUGAR 4 TIMES A DAY. (Patient not taking: Reported on 04/02/2020), Disp: 100 each, Rfl: 11 .  Influenza Vac A&B Surf Ant Adj (FLUAD) 0.5 ML SUSY, , Disp: , Rfl:  .  Lancets MISC, Check blood sugar 4 times a day, Disp: 100 each, Rfl: 11 .  nitroGLYCERIN (NITROSTAT) 0.4 MG SL tablet, DISSOLVE 1 TABLET UNDER TONGUE AS NEEDED FOR CHEST  PAIN,MAY REPEAT IN5 MINUTES FOR 2 DOSES. (Patient not taking: Reported on 03/19/2020), Disp: 25 tablet, Rfl: 3 .  temazepam (RESTORIL) 30 MG capsule, TAKE 1 CAPSULE AT BEDTIME AS NEEDED FOR SLEEP. (Patient not taking: Reported on 04/23/2020), Disp: 30 capsule, Rfl: 0 .  traMADol (ULTRAM) 50 MG tablet, Take 1 tablet (50 mg total) by mouth every 6 (six) hours as needed for moderate pain or severe pain. (Patient not taking: Reported on 04/16/2020), Disp: 20 tablet, Rfl: 0 Allergies  Allergen Reactions  . Metoprolol Shortness Of Breath, Palpitations and Other (See Comments)    Heart starts racing. Shallow breathing; pt states he also get skin irritation on his legs  . Metformin Hives and Swelling    On legs  . Metformin And Related Nausea And Vomiting      Social History   Socioeconomic History  . Marital status: Married    Spouse name: Not on file  . Number of children: Not on file  . Years of education: Not on file  . Highest education level: Not on file  Occupational History  . Occupation: Retired  Tobacco Use  . Smoking status: Former Smoker    Packs/day: 1.50    Years: 35.00    Pack years: 52.50    Types: Cigarettes  . Smokeless tobacco: Never Used  . Tobacco comment: quit atleast 25 yrs ago, per pt  Substance and Sexual Activity  . Alcohol use: Yes    Alcohol/week: 3.0 standard drinks    Types: 3 Shots of liquor per week    Comment: socially  . Drug use: No  . Sexual activity: Yes  Other Topics Concern  . Not on file  Social History Narrative   Pt lives in [place] with spouse.   Retired from [employer].  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   Social Determinants of Health   Financial Resource Strain:   . Difficulty of Paying Living Expenses:   Food Insecurity:   . Worried About Charity fundraiser in the Last Year:   . Arboriculturist in the Last Year:   Transportation Needs:   . Film/video editor (Medical):    Marland Kitchen Lack of Transportation (Non-Medical):   Physical Activity:   . Days of Exercise per Week:   . Minutes of Exercise per Session:   Stress:   . Feeling of Stress :   Social Connections:   . Frequency of Communication with Friends and Family:   . Frequency of Social Gatherings with Friends and Family:   . Attends Religious Services:   . Active Member of Clubs or Organizations:   . Attends Archivist Meetings:   Marland Kitchen Marital Status:   Intimate Partner Violence:   . Fear of Current or Ex-Partner:   . Emotionally Abused:   Marland Kitchen Physically Abused:   . Sexually Abused:     Physical Exam Cardiovascular:     Rate and Rhythm: Normal rate and regular rhythm.     Pulses:  Normal pulses.  Pulmonary:     Effort: Pulmonary effort is normal.     Breath sounds: Normal breath sounds.  Musculoskeletal:        General: Normal range of motion.     Right lower leg: No edema.     Left lower leg: No edema.  Skin:    General: Skin is warm and dry.     Capillary Refill: Capillary refill takes less than 2 seconds.  Neurological:     Mental Status: He is alert and oriented to person, place, and time.  Psychiatric:        Mood and Affect: Mood normal.         Future Appointments  Date Time Provider [place]  05/05/2020 12:00 PM Larey Dresser, MD [facility] None  05/15/2020 10:15 AM Gardiner Barefoot, DPM [facility]    BP (!) 105/58 (BP Location: Left Arm, Patient Position: Sitting, Cuff Size: Normal)   Pulse 71   Resp 16   Wt 190 lb (86.2 kg)   SpO2 93%   BMI 28.89 kg/m   Weight yesterday- did  Not weigh Last visit weight- 190 lb  Mr Hansen was seen at home today and reported feeling well. He denied chest pain, SOB, headache, dizziness, orthopnea, fever or cough over the past week. He stated he has bene compliant with his medications but had missed a few days worth over the past week. His medications were verified and his pillbox was refilled. I will follow up  next week.   [name], EMT 04/23/20  ACTION: Home visit completed Next visit planned for 1 week

## 2020-04-24 ENCOUNTER — Other Ambulatory Visit (HOSPITAL_COMMUNITY): Payer: Self-pay | Admitting: Cardiology

## 2020-04-25 DIAGNOSIS — G4700 Insomnia, unspecified: Secondary | ICD-10-CM | POA: Diagnosis not present

## 2020-04-25 DIAGNOSIS — I4891 Unspecified atrial fibrillation: Secondary | ICD-10-CM | POA: Diagnosis not present

## 2020-04-25 DIAGNOSIS — E785 Hyperlipidemia, unspecified: Secondary | ICD-10-CM | POA: Diagnosis not present

## 2020-04-25 DIAGNOSIS — N529 Male erectile dysfunction, unspecified: Secondary | ICD-10-CM | POA: Diagnosis not present

## 2020-04-25 DIAGNOSIS — Z79891 Long term (current) use of opiate analgesic: Secondary | ICD-10-CM | POA: Diagnosis not present

## 2020-04-25 DIAGNOSIS — Z87891 Personal history of nicotine dependence: Secondary | ICD-10-CM | POA: Diagnosis not present

## 2020-04-25 DIAGNOSIS — I739 Peripheral vascular disease, unspecified: Secondary | ICD-10-CM | POA: Diagnosis not present

## 2020-04-25 DIAGNOSIS — Z7901 Long term (current) use of anticoagulants: Secondary | ICD-10-CM | POA: Diagnosis not present

## 2020-04-25 DIAGNOSIS — B0223 Postherpetic polyneuropathy: Secondary | ICD-10-CM | POA: Diagnosis not present

## 2020-04-25 DIAGNOSIS — Z833 Family history of diabetes mellitus: Secondary | ICD-10-CM | POA: Diagnosis not present

## 2020-04-25 DIAGNOSIS — D6869 Other thrombophilia: Secondary | ICD-10-CM | POA: Diagnosis not present

## 2020-04-25 DIAGNOSIS — J45909 Unspecified asthma, uncomplicated: Secondary | ICD-10-CM | POA: Diagnosis not present

## 2020-04-25 DIAGNOSIS — F339 Major depressive disorder, recurrent, unspecified: Secondary | ICD-10-CM | POA: Diagnosis not present

## 2020-04-25 DIAGNOSIS — E039 Hypothyroidism, unspecified: Secondary | ICD-10-CM | POA: Diagnosis not present

## 2020-04-25 DIAGNOSIS — Z85828 Personal history of other malignant neoplasm of skin: Secondary | ICD-10-CM | POA: Diagnosis not present

## 2020-04-25 DIAGNOSIS — Z6829 Body mass index (BMI) 29.0-29.9, adult: Secondary | ICD-10-CM | POA: Diagnosis not present

## 2020-04-25 DIAGNOSIS — M1990 Unspecified osteoarthritis, unspecified site: Secondary | ICD-10-CM | POA: Diagnosis not present

## 2020-04-25 DIAGNOSIS — E663 Overweight: Secondary | ICD-10-CM | POA: Diagnosis not present

## 2020-04-29 ENCOUNTER — Encounter (HOSPITAL_COMMUNITY): Payer: Medicare PPO | Admitting: Cardiology

## 2020-04-30 ENCOUNTER — Other Ambulatory Visit (HOSPITAL_COMMUNITY): Payer: Self-pay

## 2020-04-30 NOTE — Progress Notes (Signed)
Paramedicine Encounter    Patient ID: Andrew [name], male    DOB: 07/16/1928, 86 y.o.   MRN: [id number]   Patient Care Team: [name], MD as PCP - General (Family Medicine) Larey Dresser, MD as PCP - Advanced Heart Failure (Cardiology) Larey Dresser, MD as Consulting Physician (Cardiology) Jorge Ny, LCSW as Social Worker (Licensed Clinical Social Worker) Gardiner Barefoot, DPM as Consulting Physician (Podiatry)  Patient Active Problem List   Diagnosis Date Noted  . Chronic respiratory failure with hypoxia ([place]) 09/16/2018  . Bilateral recurrent inguinal hernias 11/28/2017  . Carotid artery stenosis 01/12/2016  . Chronic systolic CHF (congestive heart failure) ([place]) 12/02/2015  . 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 ([place]) 10/16/2015  . PVD (peripheral vascular disease) ([place])   . Hereditary and idiopathic peripheral neuropathy 10/02/2015  . Peripelvic (lymphatic) cyst   . Pernicious anemia 07/15/2015  . Bilateral renal cysts 07/12/2015  . Lung nodule, 22mm RML CT 07/12/15 07/12/2015  . Stage 3 chronic kidney disease 06/14/2015  . Paroxysmal atrial fibrillation ([place]) 05/28/2015  . AAA (abdominal aortic aneurysm) without rupture ([place]) 09/30/2014  . Cardiomyopathy, ischemic 05/23/2014  . DM (diabetes mellitus), type 2 with renal complications ([employer]) 06/24/1600  . Depression, major, single episode, complete remission ([place]) 03/18/2010  . Hypothyroidism 02/03/2009  . Osteoarthritis 05/27/2008  . Hyperlipidemia associated with type 2 diabetes mellitus ([place]) 06/14/2007  . Hypertension associated with diabetes ([place]) 06/14/2007  . GERD 06/14/2007    Current Outpatient Medications:  .  acetaminophen (TYLENOL) 500 MG tablet, Take 1,000 mg by mouth every 6 (six) hours as needed for mild pain or headache., Disp: , Rfl:  .  amiodarone (PACERONE) 200 MG tablet, Take 0.5 tablets (100 mg total) by mouth daily., Disp: 45 tablet, Rfl: 3 .  apixaban (ELIQUIS) 2.5 MG TABS  tablet, Take 1 tablet (2.5 mg total) by mouth 2 (two) times daily., Disp: 180 tablet, Rfl: 3 .  atorvastatin (LIPITOR) 80 MG tablet, Take 1 tablet (80 mg total) by mouth daily., Disp: 30 tablet, Rfl: 5 .  carvedilol (COREG) 12.5 MG tablet, Take 1 tablet (12.5 mg total) by mouth 2 (two) times daily., Disp: 180 tablet, Rfl: 3 .  cilostazol (PLETAL) 100 MG tablet, Take 1 tablet (100 mg total) by mouth 2 (two) times daily., Disp: 180 tablet, Rfl: 3 .  ezetimibe (ZETIA) 10 MG tablet, TAKE (1) TABLET DAILY., Disp: 90 tablet, Rfl: 0 .  FLUoxetine (PROZAC) 20 MG capsule, TAKE (1) CAPSULE DAILY., Disp: 90 capsule, Rfl: 0 .  furosemide (LASIX) 80 MG tablet, TAKE 1&1/2 TABLETS IN THE MORNING AND 1 TABLET IN THE AFTERNOON., Disp: 225 tablet, Rfl: 6 .  gabapentin (NEURONTIN) 100 MG capsule, Take 100 mg by mouth 3 (three) times daily., Disp: , Rfl:  .  hydrALAZINE (APRESOLINE) 50 MG tablet, Take 1 tablet (50 mg total) by mouth 3 (three) times daily., Disp: 270 tablet, Rfl: 3 .  isosorbide mononitrate (IMDUR) 30 MG 24 hr tablet, TAKE 1 TABLET EACH DAY., Disp: 90 tablet, Rfl: 3 .  levothyroxine (SYNTHROID) 100 MCG tablet, TAKE 1 TABLET BEFORE BREAKFAST., Disp: 90 tablet, Rfl: 0 .  Multiple Vitamin (MULTIVITAMIN) tablet, Take 1 tablet by mouth daily., Disp: , Rfl:  .  potassium chloride SA (KLOR-CON) 20 MEQ tablet, TAKE 1 TABLET ONCE DAILY., Disp: 90 tablet, Rfl: 3 .  SPIRIVA HANDIHALER 18 MCG inhalation capsule, PLACE 1 CAPSULE INTO INHALER AND INHALE ONCE DAILY., Disp: 90 capsule, Rfl: 0 .  glucose  blood (ACCU-CHEK AVIVA PLUS) test strip, CHECK BLOOD SUGAR 4 TIMES A DAY. (Patient not taking: Reported on 04/02/2020), Disp: 100 each, Rfl: 11 .  Influenza Vac A&B Surf Ant Adj (FLUAD) 0.5 ML SUSY, , Disp: , Rfl:  .  Lancets MISC, Check blood sugar 4 times a day, Disp: 100 each, Rfl: 11 .  nitroGLYCERIN (NITROSTAT) 0.4 MG SL tablet, DISSOLVE 1 TABLET UNDER TONGUE AS NEEDED FOR CHEST PAIN,MAY REPEAT IN5 MINUTES FOR 2  DOSES. (Patient not taking: Reported on 03/19/2020), Disp: 25 tablet, Rfl: 3 .  temazepam (RESTORIL) 30 MG capsule, TAKE 1 CAPSULE AT BEDTIME AS NEEDED FOR SLEEP. (Patient not taking: Reported on 04/23/2020), Disp: 30 capsule, Rfl: 0 .  traMADol (ULTRAM) 50 MG tablet, Take 1 tablet (50 mg total) by mouth every 6 (six) hours as needed for moderate pain or severe pain. (Patient not taking: Reported on 04/16/2020), Disp: 20 tablet, Rfl: 0 Allergies  Allergen Reactions  . Metoprolol Shortness Of Breath, Palpitations and Other (See Comments)    Heart starts racing. Shallow breathing; pt states he also get skin irritation on his legs  . Metformin Hives and Swelling    On legs  . Metformin And Related Nausea And Vomiting      Social History   Socioeconomic History  . Marital status: Married    Spouse name: Not on file  . Number of children: Not on file  . Years of education: Not on file  . Highest education level: Not on file  Occupational History  . Occupation: Retired  Tobacco Use  . Smoking status: Former Smoker    Packs/day: 1.50    Years: 35.00    Pack years: 52.50    Types: Cigarettes  . Smokeless tobacco: Never Used  . Tobacco comment: quit atleast 25 yrs ago, per pt  Substance and Sexual Activity  . Alcohol use: Yes    Alcohol/week: 3.0 standard drinks    Types: 3 Shots of liquor per week    Comment: socially  . Drug use: No  . Sexual activity: Yes  Other Topics Concern  . Not on file  Social History Narrative   Pt lives in [place] with spouse.   Retired from [employer].  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   Social Determinants of Health   Financial Resource Strain:   . Difficulty of Paying Living Expenses:   Food Insecurity:   . Worried About Charity fundraiser in the Last Year:   . Arboriculturist in the Last Year:   Transportation Needs:   . Film/video editor (Medical):   Marland Kitchen Lack of Transportation  (Non-Medical):   Physical Activity:   . Days of Exercise per Week:   . Minutes of Exercise per Session:   Stress:   . Feeling of Stress :   Social Connections:   . Frequency of Communication with Friends and Family:   . Frequency of Social Gatherings with Friends and Family:   . Attends Religious Services:   . Active Member of Clubs or Organizations:   . Attends Archivist Meetings:   Marland Kitchen Marital Status:   Intimate Partner Violence:   . Fear of Current or Ex-Partner:   . Emotionally Abused:   Marland Kitchen Physically Abused:   . Sexually Abused:     Physical Exam Cardiovascular:     Rate and Rhythm: Normal rate and regular rhythm.     Pulses: Normal pulses.  Pulmonary:  Effort: Pulmonary effort is normal.     Breath sounds: Normal breath sounds.  Musculoskeletal:        General: Normal range of motion.     Right lower leg: No edema.     Left lower leg: No edema.  Skin:    General: Skin is warm and dry.     Capillary Refill: Capillary refill takes less than 2 seconds.  Neurological:     Mental Status: He is alert and oriented to person, place, and time.  Psychiatric:        Mood and Affect: Mood normal.         Future Appointments  Date Time Provider [place]  05/05/2020 12:00 PM Larey Dresser, MD [facility] None  05/15/2020 10:15 AM Gardiner Barefoot, DPM [facility]    BP 124/60 (BP Location: Left Arm, Patient Position: Sitting, Cuff Size: Normal)   Pulse 60   Resp 16   Wt 188 lb (85.3 kg)   SpO2 94%   BMI 28.59 kg/m   Weight yesterday- did not weigh Last visit weight- 190 lb  Mr Amparan was seen at home today and reported feeling well. He denied chest pain, Sob, headache, dizziness, orthopnea, fever or cough since our last visit. He stated he has been compliant with his medications over the past week and his weight has been stable. His medications were verified and his pillbox was refilled. I will follow up next week.   [name],  EMT 04/30/20  ACTION: Home visit completed Next visit planned for 1 week

## 2020-05-05 ENCOUNTER — Other Ambulatory Visit: Payer: Self-pay

## 2020-05-05 ENCOUNTER — Ambulatory Visit (HOSPITAL_COMMUNITY)
Admission: RE | Admit: 2020-05-05 | Discharge: 2020-05-05 | Disposition: A | Discharge status: Home / Self Care | Payer: Medicare PPO | Source: Ambulatory Visit | Attending: Cardiology | Admitting: Cardiology

## 2020-05-05 VITALS — BP 120/52 | HR 65 | Wt 195.0 lb

## 2020-05-05 DIAGNOSIS — Z79890 Hormone replacement therapy (postmenopausal): Secondary | ICD-10-CM | POA: Insufficient documentation

## 2020-05-05 DIAGNOSIS — Z8673 Personal history of transient ischemic attack (TIA), and cerebral infarction without residual deficits: Secondary | ICD-10-CM | POA: Insufficient documentation

## 2020-05-05 DIAGNOSIS — I5042 Chronic combined systolic (congestive) and diastolic (congestive) heart failure: Secondary | ICD-10-CM | POA: Diagnosis not present

## 2020-05-05 DIAGNOSIS — Z833 Family history of diabetes mellitus: Secondary | ICD-10-CM | POA: Diagnosis not present

## 2020-05-05 DIAGNOSIS — Z7901 Long term (current) use of anticoagulants: Secondary | ICD-10-CM | POA: Diagnosis not present

## 2020-05-05 DIAGNOSIS — Z8349 Family history of other endocrine, nutritional and metabolic diseases: Secondary | ICD-10-CM | POA: Insufficient documentation

## 2020-05-05 DIAGNOSIS — Z85820 Personal history of malignant melanoma of skin: Secondary | ICD-10-CM | POA: Insufficient documentation

## 2020-05-05 DIAGNOSIS — Z8249 Family history of ischemic heart disease and other diseases of the circulatory system: Secondary | ICD-10-CM | POA: Diagnosis not present

## 2020-05-05 DIAGNOSIS — I480 Paroxysmal atrial fibrillation: Secondary | ICD-10-CM

## 2020-05-05 DIAGNOSIS — E1151 Type 2 diabetes mellitus with diabetic peripheral angiopathy without gangrene: Secondary | ICD-10-CM | POA: Diagnosis not present

## 2020-05-05 DIAGNOSIS — K219 Gastro-esophageal reflux disease without esophagitis: Secondary | ICD-10-CM | POA: Diagnosis not present

## 2020-05-05 DIAGNOSIS — N183 Chronic kidney disease, stage 3 unspecified: Secondary | ICD-10-CM | POA: Insufficient documentation

## 2020-05-05 DIAGNOSIS — J449 Chronic obstructive pulmonary disease, unspecified: Secondary | ICD-10-CM | POA: Diagnosis not present

## 2020-05-05 DIAGNOSIS — Z79899 Other long term (current) drug therapy: Secondary | ICD-10-CM | POA: Insufficient documentation

## 2020-05-05 DIAGNOSIS — I739 Peripheral vascular disease, unspecified: Secondary | ICD-10-CM | POA: Diagnosis not present

## 2020-05-05 DIAGNOSIS — E785 Hyperlipidemia, unspecified: Secondary | ICD-10-CM | POA: Diagnosis not present

## 2020-05-05 DIAGNOSIS — I779 Disorder of arteries and arterioles, unspecified: Secondary | ICD-10-CM

## 2020-05-05 DIAGNOSIS — I447 Left bundle-branch block, unspecified: Secondary | ICD-10-CM | POA: Insufficient documentation

## 2020-05-05 DIAGNOSIS — I5022 Chronic systolic (congestive) heart failure: Secondary | ICD-10-CM | POA: Diagnosis not present

## 2020-05-05 DIAGNOSIS — I130 Hypertensive heart and chronic kidney disease with heart failure and stage 1 through stage 4 chronic kidney disease, or unspecified chronic kidney disease: Secondary | ICD-10-CM | POA: Diagnosis not present

## 2020-05-05 DIAGNOSIS — E039 Hypothyroidism, unspecified: Secondary | ICD-10-CM | POA: Insufficient documentation

## 2020-05-05 DIAGNOSIS — E1122 Type 2 diabetes mellitus with diabetic chronic kidney disease: Secondary | ICD-10-CM | POA: Diagnosis not present

## 2020-05-05 DIAGNOSIS — I252 Old myocardial infarction: Secondary | ICD-10-CM | POA: Diagnosis not present

## 2020-05-05 DIAGNOSIS — I255 Ischemic cardiomyopathy: Secondary | ICD-10-CM | POA: Insufficient documentation

## 2020-05-05 DIAGNOSIS — Z951 Presence of aortocoronary bypass graft: Secondary | ICD-10-CM | POA: Diagnosis not present

## 2020-05-05 DIAGNOSIS — I2510 Atherosclerotic heart disease of native coronary artery without angina pectoris: Secondary | ICD-10-CM

## 2020-05-05 DIAGNOSIS — Z87891 Personal history of nicotine dependence: Secondary | ICD-10-CM | POA: Diagnosis not present

## 2020-05-05 LAB — COMPREHENSIVE METABOLIC PANEL
ALT: 75 U/L — ABNORMAL HIGH (ref 0–44)
AST: 69 U/L — ABNORMAL HIGH (ref 15–41)
Albumin: 3.2 g/dL — ABNORMAL LOW (ref 3.5–5.0)
Alkaline Phosphatase: 59 U/L (ref 38–126)
Anion gap: 10 (ref 5–15)
BUN: 41 mg/dL — ABNORMAL HIGH (ref 8–23)
CO2: 27 mmol/L (ref 22–32)
Calcium: 9 mg/dL (ref 8.9–10.3)
Chloride: 103 mmol/L (ref 98–111)
Creatinine, Ser: 1.96 mg/dL — ABNORMAL HIGH (ref 0.61–1.24)
GFR calc Af Amer: 34 mL/min — ABNORMAL LOW (ref >60)
GFR calc non Af Amer: 29 mL/min — ABNORMAL LOW (ref >60)
Glucose, Bld: 158 mg/dL — ABNORMAL HIGH (ref 70–99)
Potassium: 4 mmol/L (ref 3.5–5.1)
Sodium: 140 mmol/L (ref 135–145)
Total Bilirubin: 1 mg/dL (ref 0.3–1.2)
Total Protein: 6 g/dL — ABNORMAL LOW (ref 6.5–8.1)

## 2020-05-05 LAB — TSH: TSH: 2.4 u[IU]/mL (ref 0.350–4.500)

## 2020-05-05 NOTE — Patient Instructions (Addendum)
No medication changes!  Labs today We will only contact you if something comes back abnormal or we need to make some changes. Otherwise no news is good news!  You have been referred to Vascular Vein Surgery.  They will call you to schedule an appointment  Your physician recommends that you schedule a follow-up appointment in: 4 months with Dr [name]   Tuesday, September 14th, 2021 at 12pm Garage code [id number]  Please call office at [phone] option 2 if you have any questions or concerns.   At the [facility], you and your health needs are our priority. As part of our continuing mission to provide you with exceptional heart care, we have created designated Provider Care Teams. These Care Teams include your primary Cardiologist (physician) and Advanced Practice Providers (APPs- Physician Assistants and Nurse Practitioners) who all work together to provide you with the care you need, when you need it.   You may see any of the following providers on your designated Care Team at your next follow up: Marland Kitchen Dr Glori Bickers . Dr Loralie Champagne . Darrick Grinder, NP . Lyda Jester, PA . Audry Riles, PharmD   Please be sure to bring in all your medications bottles to every appointment.

## 2020-05-05 NOTE — Progress Notes (Signed)
Date:  05/05/2020   ID:  [name], DOB 1928-07-28, MRN [id number]  Provider location: [facility] Type of Visit: Established patient   PCP:  [name], MD  Cardiologist:  Dr. [name]   History of Present Illness: Andrew [name] is a 86 y.o. male who has a history of CKD, CAD s/p CABG, and ischemic cardiomyopathy with primarily diastolic CHF.  He has PAD followed at [facility].  I took him for [facility] in 11/15. 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  He had had occlusion of a relatively small LAD, there were right to left collaterals and no intervention was done (medical management).    He and his wife went on a cruise along the [employer] in 5/16.  He admits to considerable dietary indiscretion (high sodium diet). It appears that he developed a hypertensive crisis along with chest pain while on the ship. He went into atrial fibrillation and developed CHF.  He was taken to the hospital in [place], [place].  He was in the ICU for about a week on Bipap intermittently.  Troponin peaked at 0.09 during this admission.  He was diuresed and after about 2 wks left the hospital and was able to fly home.    Cardiolite (6/16) showed EF 42%, prior anterolateral MI, no ischemia. Echo in 10/16 with EF 40-45%.    He was admitted on 07/06/15 for RLQ pain and fever.  He was treated for UTI and had a kidney stone as well.  He developed SOB after IVF were given for AKI.  He was treated for acute COPD exacerbation as well as CHF decompensation.  He was diuresed with IV lasix.  His weight on admission was 179 and got as high as 188 after IVF. Discharge weight was 181.  He was admitted in 10/16 with a cerebellar CVA.  He has some resultant imbalance.   He was again admitted in 11/16 with acute on chronic systolic CHF.  This was a short admission that appeared to be precipitated by a sodium load from eating at a [employer] .   3/18 Echo showed stable EF 40-45%.  He  was also having chest pain so I did a Cardiolite in 3/18, no ischemia.   He was admitted in 11/18 for elective hernia repair. Post-op, he developed hypoxemia and had a rehab stay.  He is now off oxygen.   Creatinine increased to 2.97 in 1/19.  I cut back on losartan and Lasix.  He developed increased lower extremity edema and we increased Lasix back to 120 qam/80 qpm.  He had a mechanical fall in 7/20 (tripped). He hit his head but refused to go to the ER.  Later in 7/20, he was admitted with fever and started on antibiotics with concern for PNA, but CT chest did not show definite PNA.  ?Viral syndrome.  COVID-19 was negative.   Echo in 8/20 showed  EF 45-50% with anterolateral hypokinesis, normal RV.   He returns for followup of CHF and CAD. Weight is up about 4 lbs.  He is stable symptomatically.  Not very active but able to get around the house and yard without significant dyspnea.  No significant claudication.  No chest pain.  No palpitations. He is in NSR today.  He came in in the wheelchair today as he pulled a gluteal muscle using his stair stepper machine yesterday.   ECG (personally reviewed): NSR, 1st degree AVB, LBBB 144  msec  Labs (12/14): K 4.8, creatinine 1.5 Labs (5/15): LDL 92 Labs (11/15): K 4.1, creatinine 1.5 Labs (12/15): K 4, creatinine 1.5 Labs (1/16): LFTs normal, LDL 51, HDL 47 Labs (5/16): TnI 0.09 Labs (6/16): K 4.8 => 4.4, creatinine 1.79 => 2.26, HCT 28.7 Labs (7/16) K 4.2, creatinine 1.98, LFTs normal, TSH normal Labs (8/16): HCT 27.1 Labs (9/16): hgb 8.1, K 4.3, creatinine 1.8, LFTs normal, TSH elevated Labs (10/16): LDL 74, HDL 45, TSH 5.19 (increased), free T3 and free T4 normal.  Labs (11/16): K 3.5, creatinine 1.47, AST 84, ALT 89 Labs (12/16): K 4.3, creatinine 1.58, AST 43, ALT 42 Labs (3/17): K 4.2, creatinine 1.4, hgb 10.7, AST 36, ALT 45 Labs (6/17): K 4, creatinine 1.69, hgb 10.9, proBNP 389, TSH mildly elevated Labs (8/17): Free T4 and T3  normal, TSH mildly elevated, K 4.2, creatinine 1.7, LFTs normal, hgb 10.4 Labs (12/17): TSH elevated but free T3 and T4 normal, K 4.5, creatinine 1.9, LDL 45, HDL 39, LFTs normal, HCT 32.9 Labs (1/18): K 4.4, creatinine 1.93 Labs (3/18): K 4.3, creatinine 1.87, BNP 187, hgb 11.1, LDL 75, LFTs normal Labs (4/18): K 4.1, creatinine 1.7 Labs (9/18): K 4, creatinine 1.8, hgb 11.9 Labs (10/18): free T4 0.4, free T3 1.9, TSH 139  Labs (12/18): K 3.4, creatinine 1.76, hgb 12.2 Labs (1/19): K 4 => 4.9, creatinine 2.7 => 2.97, TSH 179, LFTs normal Labs (2/19): K 4.6, creatinine 2.19 Labs (4/19): LDL 61, HDL 36, K 3.9, creatinine 1.77, LFTs normal  Labs (6/19): hgb 11.7, K 3.8, creatinine 2.09, TSH 10 Labs (9/19): K 3.9, creatinine 1.69 Labs (12/19): K 3.5, creatinine 1.69, LDL 75, HDL 39, TSH normal Labs (7/20): K 3.8, creatinine 1.69 Labs (8/20): K 4, creatinine 1.63, hgb 12.1 Labs (12/20): K 3.9, creatinine 2.05, AST 44, ALT 58, hgb 12.9, TSH normal Labs (2/21): K 4, creatinine 1.99, hgb 12.5, plts 133, LDL 58, HDL 34, AST 72, ALT 87  PMH: 1. CKD stage 3 2. HTN 3. Hyperlipidemia 4. AAA: s/p surgery with left renal artery bypass in 1997 - Abdominal US 10/18 with 3.2 AAA 5. GERD 6. IBS 7. Melanoma s/p reception 8. Diabetes: Diet-controlled 9. Carotid stenosis: Carotid dopplers (4/15) with 40-59% bilateral stenosis. Carotid dopplers (7/16) with < 40% BICA stenosis.  Carotid dopplers (10/16) with 40-59% BICA stenosis.  - Carotids (8/17) with minimal disease.  - Carotids (10/18) with 1-39% BICA stenosis.  - Carotids (1/19): 1-39% BICA stenosis 10. PAD: 9/14 ABIs 0.91 right 1.09 left.  7/16 ABIs 0.86 right, 1.1 left; aortoiliac duplex with < 50% bilateral iliac stenosis.  - Aortoiliac duplex (8/17) with < 50% bilateral iliac stenosis.   - ABIs (10/17): left 0.91, right 0.98, left TBI 0.44 (abnormal).  - ABIs (7/18): right 0.97, right 0.75 - ABIs (1/19): right 1.04, left 1.03 - ABIs (7/19):  right 1.13, noncompressible left ABI but TBI 0.93 11. CAD: Has super-dominant RCA. s/p CABG 1992.  He had Taxus DES to mid PDA in 5/02. LHC (1/05) with 90% ostial PDA (had DES to this vessel), totally occluded RIMA-PDA, totally occluded PLV, sequential SVG-PLV with 1 branch occluded.  LHC (11/15) with left main off the right cusp, 40-50% ostial left main, total occlusion of the proximal LAD (small vessel) with right to left collaterals, large super-dominant RCA with 50% ISR mid RCA, 50% ostial PDA, patent SVG-PLV, RIMA-PDA known atretic. Lexiscan Cardiolite (6/16) with EF 42%, prior anterolateral MI, no ischemia.  - Lexiscan Cardiolite (3/18): EF 51%, basal  to mid anterolateral fixed defect with no ischemia.  12. Ischemic cardiomyopathy: Echo (12/09) with EF 45-50%, basal to mid inferolateral hypokinesis. Echo (6/15) with EF 45-50%, akinesis of the mid to apical inferolateral wall.  Echo (6/16) with EF 50-55%, mild LVH, inferior hypokinesis, mild MR.  Echo (10/16) with EF 40-45%, moderate LVH, mildly decreased RV systolic function.  - Echo (3/18) with EF 40-45%, mid anterior hypokinesis.  - Echo (2/19) with EF 60-65%, mild LVH, trade II diastolic dysfunction.  - Echo (8/20): EF 45-50%, anterolateral hypokinesis, normal RV size and systolic function.  13. OA 14. Atrial fibrillation/flutter: Paroxysmal.  Noted during 5/16 hospitalization in [place] and in 6/16.  He developed atrial flutter in 8/16, back in NSR by 9/16.  15. Emphysema: PFTs (8/16) with FVC 88%, FEV1 74%, ratio 81%, TLC 83%, DLCO 31%. - PFTs (12/18): FVC 64%, FEV1 69%, ratio 105%, TLC 82%, DLCO 33% => mild obstruction, severely decreased DLCO (similar to prior).   16. Renal cysts 17. Idiopathic peripheral neuropathy.  18. Anemia.  44. 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  20. Hernia repair (11/18) 21. Hypothyroidism 22. Bronchiectasis: CT chest 1/19 with bronchiectasis.  23. Lung nodule: 1.3 cm RML nodule on 1/19 CT, was noted in 2014 => slow  growing. Possibly malignant.  Decision made with pulmonary to watch and avoid biopsy.  - CT chest (7/20) with stable RML nodule, no evidence for amiodarone toxicity.  24. Suspect chronic aspiration with bronchiectasis.    Current Outpatient Medications  Medication Sig Dispense Refill  . acetaminophen (TYLENOL) 500 MG tablet Take 1,000 mg by mouth every 6 (six) hours as needed for mild pain or headache.    Marland Kitchen amiodarone (PACERONE) 200 MG tablet Take 0.5 tablets (100 mg total) by mouth daily. 45 tablet 3  . apixaban (ELIQUIS) 2.5 MG TABS tablet Take 1 tablet (2.5 mg total) by mouth 2 (two) times daily. 180 tablet 3  . atorvastatin (LIPITOR) 80 MG tablet Take 1 tablet (80 mg total) by mouth daily. 30 tablet 5  . carvedilol (COREG) 12.5 MG tablet Take 1 tablet (12.5 mg total) by mouth 2 (two) times daily. 180 tablet 3  . cilostazol (PLETAL) 100 MG tablet Take 1 tablet (100 mg total) by mouth 2 (two) times daily. 180 tablet 3  . ezetimibe (ZETIA) 10 MG tablet TAKE (1) TABLET DAILY. 90 tablet 0  . FLUoxetine (PROZAC) 20 MG capsule TAKE (1) CAPSULE DAILY. 90 capsule 0  . furosemide (LASIX) 80 MG tablet TAKE 1&1/2 TABLETS IN THE MORNING AND 1 TABLET IN THE AFTERNOON. 225 tablet 6  . gabapentin (NEURONTIN) 100 MG capsule Take 100 mg by mouth 3 (three) times daily.    Marland Kitchen glucose blood (ACCU-CHEK AVIVA PLUS) test strip CHECK BLOOD SUGAR 4 TIMES A DAY. 100 each 11  . hydrALAZINE (APRESOLINE) 50 MG tablet Take 1 tablet (50 mg total) by mouth 3 (three) times daily. 270 tablet 3  . Influenza Vac A&B Surf Ant Adj (FLUAD) 0.5 ML SUSY     . isosorbide mononitrate (IMDUR) 30 MG 24 hr tablet TAKE 1 TABLET EACH DAY. 90 tablet 3  . Lancets MISC Check blood sugar 4 times a day 100 each 11  . levothyroxine (SYNTHROID) 100 MCG tablet TAKE 1 TABLET BEFORE BREAKFAST. 90 tablet 0  . Multiple Vitamin (MULTIVITAMIN) tablet Take 1 tablet by mouth daily.    . nitroGLYCERIN (NITROSTAT) 0.4 MG SL tablet DISSOLVE 1 TABLET UNDER  TONGUE AS NEEDED FOR CHEST PAIN,MAY REPEAT IN5 MINUTES FOR 2 DOSES. 25  tablet 3  . potassium chloride SA (KLOR-CON) 20 MEQ tablet TAKE 1 TABLET ONCE DAILY. 90 tablet 3  . SPIRIVA HANDIHALER 18 MCG inhalation capsule PLACE 1 CAPSULE INTO INHALER AND INHALE ONCE DAILY. 90 capsule 0   No current facility-administered medications for this encounter.    Allergies:   Metoprolol, Metformin, and Metformin and related   Social History:  The patient  reports that he has quit smoking. His smoking use included cigarettes. He has a 52.50 pack-year smoking history. He has never used smokeless tobacco. He reports current alcohol use of about 3.0 standard drinks of alcohol per week. He reports that he does not use drugs.   Family History:  The patient's family history includes Colon cancer in his father; Coronary artery disease in his brother; Diabetes in his brother and son; Heart disease in his brother; Hyperlipidemia in his brother and son; Peripheral vascular disease in his brother; Throat cancer in his brother.   ROS:  Please see the history of present illness.   All other systems are personally reviewed and negative.   Exam:   BP (!) 120/52   Pulse 65   Wt 88.5 kg (195 lb)   SpO2 93%   BMI 29.65 kg/m  General: NAD Neck: No JVD, no thyromegaly or thyroid nodule.  Lungs: Clear to auscultation bilaterally with normal respiratory effort. CV: Nondisplaced PMI.  Heart regular S1/S2, no S3/S4, no murmur.  1+ ankle edema.  No carotid bruit.  Difficult to palpate pedal pulses.  Abdomen: Soft, nontender, no hepatosplenomegaly, no distention.  Skin: Intact without lesions or rashes.  Neurologic: Alert and oriented x 3.  Psych: Normal affect. Extremities: No clubbing or cyanosis.  HEENT: Normal.   Recent Labs: 07/02/2019: B Natriuretic Peptide 393.5 01/31/2020: Hemoglobin 12.5; Platelets 133 05/05/2020: ALT 75; BUN 41; Creatinine, Ser 1.96; Potassium 4.0; Sodium 140; TSH 2.400  Personally reviewed   Wt  Readings from Last 3 Encounters:  05/05/20 88.5 kg (195 lb)  04/30/20 85.3 kg (188 lb)  04/23/20 86.2 kg (190 lb)      ASSESSMENT AND PLAN:  1. CAD: s/p CABG.  Cath in 11/15 showed occluded LAD with left to right collaterals.  The LAD was a relatively small vessel (super-dominant right).  Managed medically. Lexiscan Cardiolite in 6/16 with infarction but no ischemia.  Cardiolite 3/18 with infarction, no ischemia. No chest pain.  - Continue atorvastatin, he is not on ASA given use of Eliquis.  2. Chronic primarily diastolic CHF/ischemic cardiomyopathy: EF 40-45% on 3/18 echo.  Echo 8/20 with EF 45-50%, normal RV.  NYHA II. He does not appear significantly volume overloaded on exam.  - Continue Lasix 120 qam/80 qpm with KCl.  BMET today.   - Continue hydralazine 50 mg tid.    - Continue Imdur 30 mg daily.    - Continue Coreg 12.5 mg bid.  - Not on ACEi/ARB with elevated creatinine.    3. Carotid stenosis: Stable. Due for repeat carotid dopplers, needs to make followup at [facility].   4. PAD: Improved claudication, ABIs had normalized when last checked.  No pedal ulcers.   - Due for followup peripheral arterial dopplers, he needs to make appt at [facility].  - Continue cilostazol, unable to come off due to worsening claudication when he stops it.  5. Hyperlipidemia: Continue atorvastatin, good lipids in 2/21.  6. CKD III:  BMET today.  7. Atrial fibrillation/flutter:  Paroxysmal.  NSR today.  - Continue amiodarone 100 mg daily. Will need to get  regular eye exams with amiodarone use.  CT chest in 7/20 did not appear to show evidence for amiodarone lung toxicity.  Check LFTs today, if still abnormal will need to get abdominal US to assess liver.   PCP follows TSH (hypothyroid).  - Continue Eliquis 2.5 bid.  8. Suspected OSA:  Still needs home sleep study.    9. COPD: He is now off oxygen. He has bronchiectasis thought to be due to chronic aspiration. - Follows with pulmonary.  10. CVA: Cerebellar, 10/16.   He has had some resulting imbalance.  - He is no longer driving.  11. Hypertension: BP has been controlled when he takes his medications.  12. Hypothyroidism: Likely related to amiodarone. - Followed by PCP.  13. Pulmonary nodule: 1.3 cm RML nodule on 1/19 CT chest.  It was also seen in 2014 => slow growing.  Dr [place] Bells has discussed with wife and they have decided not to go forward with biopsy. They would not be interested in lung cancer treatment.  CT chest in 7/20 showed stable 1 cm RML nodule.   Followup in 4 months.   Signed, Loralie Champagne, MD  05/05/2020  [facility] [address] [facility] [place] [address] [phone] (office) [phone] (fax)

## 2020-05-06 ENCOUNTER — Telehealth (HOSPITAL_COMMUNITY): Payer: Self-pay

## 2020-05-06 DIAGNOSIS — R748 Abnormal levels of other serum enzymes: Secondary | ICD-10-CM

## 2020-05-06 DIAGNOSIS — I5022 Chronic systolic (congestive) heart failure: Secondary | ICD-10-CM

## 2020-05-06 NOTE — Telephone Encounter (Signed)
-----   Message from Larey Dresser, MD sent at 05/05/2020  1:52 PM EDT ----- LFTs still elevated, would have him get RUQ [place] to look at liver, ?cirrhosis.

## 2020-05-06 NOTE — Telephone Encounter (Signed)
Pt aware of results. Order placed for RUQ [place]. Pt advised he will receive a call to schedule this test. Verbalized understanding.

## 2020-05-07 ENCOUNTER — Other Ambulatory Visit (HOSPITAL_COMMUNITY): Payer: Self-pay

## 2020-05-07 ENCOUNTER — Other Ambulatory Visit (HOSPITAL_COMMUNITY): Payer: Self-pay | Admitting: Cardiology

## 2020-05-07 NOTE — Progress Notes (Signed)
Andrew [name] was seen at home today to prepare his medications in his pillbox for the coming week. He had been seen at the HF clinic on Tuesday of this week so a full visit was not necessary at this time. His medications were verified and his pillbox was refilled. I will follow up next week.   [name], EMT 05/07/20

## 2020-05-11 ENCOUNTER — Ambulatory Visit (HOSPITAL_COMMUNITY)
Admission: RE | Admit: 2020-05-11 | Discharge: 2020-05-11 | Disposition: A | Discharge status: Home / Self Care | Payer: Medicare PPO | Source: Ambulatory Visit | Attending: Cardiology | Admitting: Cardiology

## 2020-05-11 ENCOUNTER — Other Ambulatory Visit: Payer: Self-pay

## 2020-05-11 DIAGNOSIS — K7689 Other specified diseases of liver: Secondary | ICD-10-CM | POA: Diagnosis not present

## 2020-05-11 DIAGNOSIS — R748 Abnormal levels of other serum enzymes: Secondary | ICD-10-CM | POA: Diagnosis not present

## 2020-05-14 ENCOUNTER — Telehealth (HOSPITAL_COMMUNITY): Payer: Self-pay | Admitting: *Deleted

## 2020-05-14 ENCOUNTER — Other Ambulatory Visit (HOSPITAL_COMMUNITY): Payer: Self-pay

## 2020-05-14 NOTE — Telephone Encounter (Signed)
pts wife called requesting results from abdominal ultrasound on Monday. She is aware of results (see below)   Valeda Malm, RN  05/12/2020 3:17 PM EDT    LM for patient to return call   Larey Dresser, MD  05/12/2020 1:52 PM EDT    Probably normal, mass that likely represents benign hemangioma is noted.

## 2020-05-14 NOTE — Progress Notes (Signed)
Paramedicine Encounter    Patient ID: RHYATT MUSKA, male    DOB: 1928/04/24, 86 y.o.   MRN: [id number]   Patient Care Team: [name], MD as PCP - General (Family Medicine) Larey Dresser, MD as PCP - Advanced Heart Failure (Cardiology) Larey Dresser, MD as Consulting Physician (Cardiology) Jorge Ny, LCSW as Social Worker (Licensed Clinical Social Worker) Gardiner Barefoot, DPM as Consulting Physician (Podiatry)  Patient Active Problem List   Diagnosis Date Noted  . Chronic respiratory failure with hypoxia ([place]) 09/16/2018  . Bilateral recurrent inguinal hernias 11/28/2017  . Carotid artery stenosis 01/12/2016  . Chronic systolic CHF (congestive heart failure) ([place]) 12/02/2015  . 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 ([place]) 10/16/2015  . PVD (peripheral vascular disease) ([place])   . Hereditary and idiopathic peripheral neuropathy 10/02/2015  . Peripelvic (lymphatic) cyst   . Pernicious anemia 07/15/2015  . Bilateral renal cysts 07/12/2015  . Lung nodule, 64mm RML CT 07/12/15 07/12/2015  . Stage 3 chronic kidney disease 06/14/2015  . Paroxysmal atrial fibrillation ([place]) 05/28/2015  . AAA (abdominal aortic aneurysm) without rupture ([place]) 09/30/2014  . Cardiomyopathy, ischemic 05/23/2014  . DM (diabetes mellitus), type 2 with renal complications ([place]) 62/69/4854  . Depression, major, single episode, complete remission ([place]) 03/18/2010  . Hypothyroidism 02/03/2009  . Osteoarthritis 05/27/2008  . Hyperlipidemia associated with type 2 diabetes mellitus ([place]) 06/14/2007  . Hypertension associated with diabetes ([place]) 06/14/2007  . GERD 06/14/2007    Current Outpatient Medications:  .  acetaminophen (TYLENOL) 500 MG tablet, Take 1,000 mg by mouth every 6 (six) hours as needed for mild pain or headache., Disp: , Rfl:  .  amiodarone (PACERONE) 200 MG tablet, Take 0.5 tablets (100 mg total) by mouth daily., Disp: 45 tablet, Rfl: 3 .  apixaban (ELIQUIS) 2.5 MG TABS  tablet, Take 1 tablet (2.5 mg total) by mouth 2 (two) times daily., Disp: 180 tablet, Rfl: 3 .  atorvastatin (LIPITOR) 80 MG tablet, TAKE 1 TABLET EACH DAY., Disp: 30 tablet, Rfl: 3 .  carvedilol (COREG) 12.5 MG tablet, Take 1 tablet (12.5 mg total) by mouth 2 (two) times daily., Disp: 180 tablet, Rfl: 3 .  cilostazol (PLETAL) 100 MG tablet, Take 1 tablet (100 mg total) by mouth 2 (two) times daily., Disp: 180 tablet, Rfl: 3 .  ezetimibe (ZETIA) 10 MG tablet, TAKE (1) TABLET DAILY., Disp: 90 tablet, Rfl: 0 .  FLUoxetine (PROZAC) 20 MG capsule, TAKE (1) CAPSULE DAILY., Disp: 90 capsule, Rfl: 0 .  furosemide (LASIX) 80 MG tablet, TAKE 1&1/2 TABLETS IN THE MORNING AND 1 TABLET IN THE AFTERNOON., Disp: 225 tablet, Rfl: 6 .  gabapentin (NEURONTIN) 100 MG capsule, Take 100 mg by mouth 3 (three) times daily., Disp: , Rfl:  .  hydrALAZINE (APRESOLINE) 50 MG tablet, Take 1 tablet (50 mg total) by mouth 3 (three) times daily., Disp: 270 tablet, Rfl: 3 .  isosorbide mononitrate (IMDUR) 30 MG 24 hr tablet, TAKE 1 TABLET EACH DAY., Disp: 90 tablet, Rfl: 3 .  levothyroxine (SYNTHROID) 100 MCG tablet, TAKE 1 TABLET BEFORE BREAKFAST., Disp: 90 tablet, Rfl: 0 .  Multiple Vitamin (MULTIVITAMIN) tablet, Take 1 tablet by mouth daily., Disp: , Rfl:  .  potassium chloride SA (KLOR-CON) 20 MEQ tablet, TAKE 1 TABLET ONCE DAILY., Disp: 90 tablet, Rfl: 3 .  SPIRIVA HANDIHALER 18 MCG inhalation capsule, PLACE 1 CAPSULE INTO INHALER AND INHALE ONCE DAILY., Disp: 90 capsule, Rfl: 0 .  glucose blood (ACCU-CHEK AVIVA PLUS)  test strip, CHECK BLOOD SUGAR 4 TIMES A DAY., Disp: 100 each, Rfl: 11 .  Influenza Vac A&B Surf Ant Adj (FLUAD) 0.5 ML SUSY, , Disp: , Rfl:  .  Lancets MISC, Check blood sugar 4 times a day, Disp: 100 each, Rfl: 11 .  nitroGLYCERIN (NITROSTAT) 0.4 MG SL tablet, DISSOLVE 1 TABLET UNDER TONGUE AS NEEDED FOR CHEST PAIN,MAY REPEAT IN5 MINUTES FOR 2 DOSES., Disp: 25 tablet, Rfl: 3 Allergies  Allergen Reactions  .  Metoprolol Shortness Of Breath, Palpitations and Other (See Comments)    Heart starts racing. Shallow breathing; pt states he also get skin irritation on his legs  . Metformin Hives and Swelling    On legs  . Metformin And Related Nausea And Vomiting      Social History   Socioeconomic History  . Marital status: Married    Spouse name: Not on file  . Number of children: Not on file  . Years of education: Not on file  . Highest education level: Not on file  Occupational History  . Occupation: Retired  Tobacco Use  . Smoking status: Former Smoker    Packs/day: 1.50    Years: 35.00    Pack years: 52.50    Types: Cigarettes  . Smokeless tobacco: Never Used  . Tobacco comment: quit atleast 25 yrs ago, per pt  Substance and Sexual Activity  . Alcohol use: Yes    Alcohol/week: 3.0 standard drinks    Types: 3 Shots of liquor per week    Comment: socially  . Drug use: No  . Sexual activity: Yes  Other Topics Concern  . Not on file  Social History Narrative   Pt lives in [place] with spouse.   Retired from [employer].  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   Social Determinants of Health   Financial Resource Strain:   . Difficulty of Paying Living Expenses:   Food Insecurity:   . Worried About Charity fundraiser in the Last Year:   . Arboriculturist in the Last Year:   Transportation Needs:   . Film/video editor (Medical):   Marland Kitchen Lack of Transportation (Non-Medical):   Physical Activity:   . Days of Exercise per Week:   . Minutes of Exercise per Session:   Stress:   . Feeling of Stress :   Social Connections:   . Frequency of Communication with Friends and Family:   . Frequency of Social Gatherings with Friends and Family:   . Attends Religious Services:   . Active Member of Clubs or Organizations:   . Attends Archivist Meetings:   Marland Kitchen Marital Status:   Intimate Partner Violence:   . Fear of Current  or Ex-Partner:   . Emotionally Abused:   Marland Kitchen Physically Abused:   . Sexually Abused:     Physical Exam Cardiovascular:     Rate and Rhythm: Normal rate and regular rhythm.     Pulses: Normal pulses.  Pulmonary:     Effort: Pulmonary effort is normal.     Breath sounds: Normal breath sounds.  Abdominal:     General: Abdomen is flat.  Musculoskeletal:        General: Normal range of motion.     Right lower leg: No edema.     Left lower leg: No edema.  Skin:    General: Skin is warm and dry.     Capillary Refill: Capillary refill takes less than 2  seconds.  Neurological:     Mental Status: He is alert and oriented to person, place, and time.  Psychiatric:        Mood and Affect: Mood normal.         Future Appointments  Date Time Provider [place]  05/15/2020 10:15 AM Gardiner Barefoot, DPM [facility]  09/08/2020 12:00 PM Larey Dresser, MD [facility] None    BP 110/60 (BP Location: Left Arm, Patient Position: Sitting, Cuff Size: Normal)   Pulse 74   Resp 16   Wt 192 lb (87.1 kg)   SpO2 90%   BMI 29.19 kg/m   Weight yesterday- did not weigh Last visit weight- 195 lb  Mr Garofano was seen at home today and reported feeling well. He denied chest pain, SOB, headache, dizziness, orthopnea, fever or cough since our last visit. He stated he has been compliant with his medications over the past week and his weight has been stable. His medications were verified and his pillbox was refilled. I will follow up next week.   [name], EMT 05/14/20  ACTION: Home visit completed Next visit planned for 1 week

## 2020-05-15 ENCOUNTER — Ambulatory Visit: Payer: Medicare PPO | Admitting: Podiatry

## 2020-05-15 ENCOUNTER — Encounter: Payer: Self-pay | Admitting: Podiatry

## 2020-05-15 ENCOUNTER — Other Ambulatory Visit: Payer: Self-pay

## 2020-05-15 DIAGNOSIS — M79676 Pain in unspecified toe(s): Secondary | ICD-10-CM | POA: Diagnosis not present

## 2020-05-15 DIAGNOSIS — B351 Tinea unguium: Secondary | ICD-10-CM

## 2020-05-15 DIAGNOSIS — I739 Peripheral vascular disease, unspecified: Secondary | ICD-10-CM

## 2020-05-15 DIAGNOSIS — E1159 Type 2 diabetes mellitus with other circulatory complications: Secondary | ICD-10-CM

## 2020-05-15 NOTE — Progress Notes (Signed)
This patient returns to my office for at risk foot care.  This patient requires this care by a professional since this patient will be at risk due to having PVD, stage 3 kidney disease and coagulation defect.  Patient is taking eliquis. This patient is unable to cut nails himself since the patient cannot reach his nails.These nails are painful walking and wearing shoes.  This patient presents for at risk foot care today.  General Appearance  Alert, conversant and in no acute stress.  Vascular  Dorsalis pedis and posterior tibial  pulses are palpable  bilaterally.  Capillary return is within normal limits  bilaterally. Temperature is within normal limits  bilaterally.  Neurologic  Senn-Weinstein monofilament wire test within normal limits  bilaterally. Muscle power within normal limits bilaterally.  Nails Thick disfigured discolored nails with subungual debris  from hallux to fifth toes bilaterally. No evidence of bacterial infection or drainage bilaterally.  Orthopedic  No limitations of motion  feet .  No crepitus or effusions noted.  No bony pathology or digital deformities noted.  Skin  normotropic skin with no porokeratosis noted bilaterally.  No signs of infections or ulcers noted.     Onychomycosis  Pain in right toes  Pain in left toes  Consent was obtained for treatment procedures.   Mechanical debridement of nails 1-5  bilaterally performed with a nail nipper.  Filed with dremel without incident.    Return office visit   10 weeks                   Told patient to return for periodic foot care and evaluation due to potential at risk complications.   Gardiner Barefoot DPM

## 2020-05-21 ENCOUNTER — Other Ambulatory Visit (HOSPITAL_COMMUNITY): Payer: Self-pay

## 2020-05-21 NOTE — Progress Notes (Signed)
Paramedicine Encounter    Patient ID: Andrew [name], male    DOB: [birth year], 86 y.o.   MRN: [id number]   Patient Care Team: [name], MD as PCP - General (Family Medicine) Larey Dresser, MD as PCP - Advanced Heart Failure (Cardiology) Larey Dresser, MD as Consulting Physician (Cardiology) Jorge Ny, LCSW as Social Worker (Licensed Clinical Social Worker) Gardiner Barefoot, DPM as Consulting Physician (Podiatry)  Patient Active Problem List   Diagnosis Date Noted  . Chronic respiratory failure with hypoxia ([place]) 09/16/2018  . Bilateral recurrent inguinal hernias 11/28/2017  . Carotid artery stenosis 01/12/2016  . Chronic systolic CHF (congestive heart failure) ([place]) 12/02/2015  . 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 PVD (peripheral vascular disease) ([place])   . Hereditary and idiopathic peripheral neuropathy 10/02/2015  . Peripelvic (lymphatic) cyst   . Pernicious anemia 07/15/2015  . Bilateral renal cysts 07/12/2015  . Lung nodule, 65mm RML CT 07/12/15 07/12/2015  . Stage 3 chronic kidney disease 06/14/2015  . Paroxysmal atrial fibrillation ([place]) 05/28/2015  . AAA (abdominal aortic aneurysm) without rupture ([place]) 09/30/2014  . Cardiomyopathy, ischemic 05/23/2014  . DM (diabetes mellitus), type 2 with renal complications ([place]) 32/35/5732  . Depression, major, single episode, complete remission ([place]) 03/18/2010  . Hypothyroidism 02/03/2009  . Osteoarthritis 05/27/2008  . Hyperlipidemia associated with type 2 diabetes mellitus ([place]) 06/14/2007  . Hypertension associated with diabetes ([place]) 06/14/2007  . GERD 06/14/2007    Current Outpatient Medications:  .  acetaminophen (TYLENOL) 500 MG tablet, Take 1,000 mg by mouth every 6 (six) hours as needed for mild pain or headache., Disp: , Rfl:  .  amiodarone (PACERONE) 200 MG tablet, Take 0.5 tablets (100 mg total) by mouth daily., Disp: 45 tablet, Rfl: 3 .  apixaban (ELIQUIS) 2.5 MG TABS  tablet, Take 1 tablet (2.5 mg total) by mouth 2 (two) times daily., Disp: 180 tablet, Rfl: 3 .  atorvastatin (LIPITOR) 80 MG tablet, TAKE 1 TABLET EACH DAY., Disp: 30 tablet, Rfl: 3 .  carvedilol (COREG) 12.5 MG tablet, Take 1 tablet (12.5 mg total) by mouth 2 (two) times daily., Disp: 180 tablet, Rfl: 3 .  cilostazol (PLETAL) 100 MG tablet, Take 1 tablet (100 mg total) by mouth 2 (two) times daily., Disp: 180 tablet, Rfl: 3 .  ezetimibe (ZETIA) 10 MG tablet, TAKE (1) TABLET DAILY., Disp: 90 tablet, Rfl: 0 .  FLUoxetine (PROZAC) 20 MG capsule, TAKE (1) CAPSULE DAILY., Disp: 90 capsule, Rfl: 0 .  furosemide (LASIX) 80 MG tablet, TAKE 1&1/2 TABLETS IN THE MORNING AND 1 TABLET IN THE AFTERNOON., Disp: 225 tablet, Rfl: 6 .  gabapentin (NEURONTIN) 100 MG capsule, Take 100 mg by mouth 3 (three) times daily., Disp: , Rfl:  .  glucose blood (ACCU-CHEK AVIVA PLUS) test strip, CHECK BLOOD SUGAR 4 TIMES A DAY., Disp: 100 each, Rfl: 11 .  hydrALAZINE (APRESOLINE) 50 MG tablet, Take 1 tablet (50 mg total) by mouth 3 (three) times daily., Disp: 270 tablet, Rfl: 3 .  isosorbide mononitrate (IMDUR) 30 MG 24 hr tablet, TAKE 1 TABLET EACH DAY., Disp: 90 tablet, Rfl: 3 .  levothyroxine (SYNTHROID) 100 MCG tablet, TAKE 1 TABLET BEFORE BREAKFAST., Disp: 90 tablet, Rfl: 0 .  Multiple Vitamin (MULTIVITAMIN) tablet, Take 1 tablet by mouth daily., Disp: , Rfl:  .  potassium chloride SA (KLOR-CON) 20 MEQ tablet, TAKE 1 TABLET ONCE DAILY., Disp: 90 tablet, Rfl: 3 .  SPIRIVA HANDIHALER 18 MCG inhalation capsule,  PLACE 1 CAPSULE INTO INHALER AND INHALE ONCE DAILY., Disp: 90 capsule, Rfl: 0 .  Influenza Vac A&B Surf Ant Adj (FLUAD) 0.5 ML SUSY, , Disp: , Rfl:  .  Lancets MISC, Check blood sugar 4 times a day, Disp: 100 each, Rfl: 11 .  nitroGLYCERIN (NITROSTAT) 0.4 MG SL tablet, DISSOLVE 1 TABLET UNDER TONGUE AS NEEDED FOR CHEST PAIN,MAY REPEAT IN5 MINUTES FOR 2 DOSES., Disp: 25 tablet, Rfl: 3 Allergies  Allergen Reactions  .  Metoprolol Shortness Of Breath, Palpitations and Other (See Comments)    Heart starts racing. Shallow breathing; pt states he also get skin irritation on his legs  . Metformin Hives and Swelling    On legs  . Metformin And Related Nausea And Vomiting      Social History   Socioeconomic History  . Marital status: Married    Spouse name: Not on file  . Number of children: Not on file  . Years of education: Not on file  . Highest education level: Not on file  Occupational History  . Occupation: Retired  Tobacco Use  . Smoking status: Former Smoker    Packs/day: 1.50    Years: 35.00    Pack years: 52.50    Types: Cigarettes  . Smokeless tobacco: Never Used  . Tobacco comment: quit atleast 25 yrs ago, per pt  Substance and Sexual Activity  . Alcohol use: Yes    Alcohol/week: 3.0 standard drinks    Types: 3 Shots of liquor per week    Comment: socially  . Drug use: No  . Sexual activity: Yes  Other Topics Concern  . Not on file  Social History Narrative   Pt lives in [place] with spouse.   Retired from [employer].  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   Social Determinants of Health   Financial Resource Strain:   . Difficulty of Paying Living Expenses:   Food Insecurity:   . Worried About Charity fundraiser in the Last Year:   . Arboriculturist in the Last Year:   Transportation Needs:   . Film/video editor (Medical):   Marland Kitchen Lack of Transportation (Non-Medical):   Physical Activity:   . Days of Exercise per Week:   . Minutes of Exercise per Session:   Stress:   . Feeling of Stress :   Social Connections:   . Frequency of Communication with Friends and Family:   . Frequency of Social Gatherings with Friends and Family:   . Attends Religious Services:   . Active Member of Clubs or Organizations:   . Attends Archivist Meetings:   Marland Kitchen Marital Status:   Intimate Partner Violence:   . Fear of Current  or Ex-Partner:   . Emotionally Abused:   Marland Kitchen Physically Abused:   . Sexually Abused:     Physical Exam Cardiovascular:     Rate and Rhythm: Normal rate and regular rhythm.     Pulses: Normal pulses.  Pulmonary:     Effort: Pulmonary effort is normal.     Breath sounds: Normal breath sounds.  Musculoskeletal:        General: Normal range of motion.     Right lower leg: No edema.     Left lower leg: No edema.  Skin:    General: Skin is warm and dry.     Capillary Refill: Capillary refill takes less than 2 seconds.  Neurological:     Mental Status: He  is alert and oriented to person, place, and time.  Psychiatric:        Mood and Affect: Mood normal.         Future Appointments  Date Time Provider [place]  08/07/2020 10:15 AM Gardiner Barefoot, DPM [facility]  09/08/2020 12:00 PM Larey Dresser, MD [facility] None    BP 107/63 (BP Location: Left Arm, Patient Position: Sitting, Cuff Size: Normal)   Pulse 62   Resp 16   Wt 190 lb (86.2 kg)   SpO2 93%   BMI 28.89 kg/m   Weight yesterday- did not weigh  Last visit weight- 192 lb  Mr Ragas was seen at home today and reported feeling well. He denied chest pain, SOB, headache, dizziness, orthopnea fever or cough since our last visit. He reported being compliant with his medications over the past week and his weight has been stable. His medications were verified and his pillbox was refilled. I will follow up next week.   [name], EMT 05/21/20  ACTION: Home visit completed Next visit planned for 1 week

## 2020-05-28 ENCOUNTER — Emergency Department (HOSPITAL_BASED_OUTPATIENT_CLINIC_OR_DEPARTMENT_OTHER): Payer: Medicare PPO

## 2020-05-28 ENCOUNTER — Other Ambulatory Visit: Payer: Self-pay

## 2020-05-28 ENCOUNTER — Other Ambulatory Visit (HOSPITAL_COMMUNITY): Payer: Self-pay

## 2020-05-28 ENCOUNTER — Emergency Department (HOSPITAL_COMMUNITY)
Admission: EM | Admit: 2020-05-28 | Discharge: 2020-05-28 | Disposition: A | Discharge status: Home / Self Care | Payer: Medicare PPO | Attending: Emergency Medicine | Admitting: Emergency Medicine

## 2020-05-28 ENCOUNTER — Telehealth: Payer: Self-pay | Admitting: Family Medicine

## 2020-05-28 ENCOUNTER — Encounter (HOSPITAL_COMMUNITY): Payer: Self-pay | Admitting: Emergency Medicine

## 2020-05-28 DIAGNOSIS — Z951 Presence of aortocoronary bypass graft: Secondary | ICD-10-CM | POA: Diagnosis not present

## 2020-05-28 DIAGNOSIS — I130 Hypertensive heart and chronic kidney disease with heart failure and stage 1 through stage 4 chronic kidney disease, or unspecified chronic kidney disease: Secondary | ICD-10-CM | POA: Insufficient documentation

## 2020-05-28 DIAGNOSIS — E1122 Type 2 diabetes mellitus with diabetic chronic kidney disease: Secondary | ICD-10-CM | POA: Insufficient documentation

## 2020-05-28 DIAGNOSIS — L538 Other specified erythematous conditions: Secondary | ICD-10-CM

## 2020-05-28 DIAGNOSIS — Z7901 Long term (current) use of anticoagulants: Secondary | ICD-10-CM | POA: Diagnosis not present

## 2020-05-28 DIAGNOSIS — R2241 Localized swelling, mass and lump, right lower limb: Secondary | ICD-10-CM | POA: Diagnosis not present

## 2020-05-28 DIAGNOSIS — Z79899 Other long term (current) drug therapy: Secondary | ICD-10-CM | POA: Insufficient documentation

## 2020-05-28 DIAGNOSIS — M79604 Pain in right leg: Secondary | ICD-10-CM | POA: Diagnosis present

## 2020-05-28 DIAGNOSIS — Z8673 Personal history of transient ischemic attack (TIA), and cerebral infarction without residual deficits: Secondary | ICD-10-CM | POA: Insufficient documentation

## 2020-05-28 DIAGNOSIS — L03115 Cellulitis of right lower limb: Secondary | ICD-10-CM

## 2020-05-28 DIAGNOSIS — I2510 Atherosclerotic heart disease of native coronary artery without angina pectoris: Secondary | ICD-10-CM | POA: Insufficient documentation

## 2020-05-28 DIAGNOSIS — Z955 Presence of coronary angioplasty implant and graft: Secondary | ICD-10-CM | POA: Diagnosis not present

## 2020-05-28 DIAGNOSIS — Z87891 Personal history of nicotine dependence: Secondary | ICD-10-CM | POA: Insufficient documentation

## 2020-05-28 DIAGNOSIS — E039 Hypothyroidism, unspecified: Secondary | ICD-10-CM | POA: Diagnosis not present

## 2020-05-28 DIAGNOSIS — N183 Chronic kidney disease, stage 3 unspecified: Secondary | ICD-10-CM | POA: Diagnosis not present

## 2020-05-28 DIAGNOSIS — I5022 Chronic systolic (congestive) heart failure: Secondary | ICD-10-CM | POA: Diagnosis not present

## 2020-05-28 DIAGNOSIS — R609 Edema, unspecified: Secondary | ICD-10-CM

## 2020-05-28 LAB — BASIC METABOLIC PANEL
Anion gap: 9 (ref 5–15)
BUN: 39 mg/dL — ABNORMAL HIGH (ref 8–23)
CO2: 30 mmol/L (ref 22–32)
Calcium: 9.3 mg/dL (ref 8.9–10.3)
Chloride: 102 mmol/L (ref 98–111)
Creatinine, Ser: 1.77 mg/dL — ABNORMAL HIGH (ref 0.61–1.24)
GFR calc Af Amer: 39 mL/min — ABNORMAL LOW (ref >60)
GFR calc non Af Amer: 33 mL/min — ABNORMAL LOW (ref >60)
Glucose, Bld: 133 mg/dL — ABNORMAL HIGH (ref 70–99)
Potassium: 3.9 mmol/L (ref 3.5–5.1)
Sodium: 141 mmol/L (ref 135–145)

## 2020-05-28 LAB — CBC WITH DIFFERENTIAL/PLATELET
Abs Immature Granulocytes: 0.02 10*3/uL (ref 0.00–0.07)
Basophils Absolute: 0 10*3/uL (ref 0.0–0.1)
Basophils Relative: 0 %
Eosinophils Absolute: 0.3 10*3/uL (ref 0.0–0.5)
Eosinophils Relative: 4 %
HCT: 38.6 % — ABNORMAL LOW (ref 39.0–52.0)
Hemoglobin: 12.7 g/dL — ABNORMAL LOW (ref 13.0–17.0)
Immature Granulocytes: 0 %
Lymphocytes Relative: 19 %
Lymphs Abs: 1.3 10*3/uL (ref 0.7–4.0)
MCH: 33.2 pg (ref 26.0–34.0)
MCHC: 32.9 g/dL (ref 30.0–36.0)
MCV: 101 fL — ABNORMAL HIGH (ref 80.0–100.0)
Monocytes Absolute: 0.7 10*3/uL (ref 0.1–1.0)
Monocytes Relative: 10 %
Neutro Abs: 4.5 10*3/uL (ref 1.7–7.7)
Neutrophils Relative %: 67 %
Platelets: 139 10*3/uL — ABNORMAL LOW (ref 150–400)
RBC: 3.82 MIL/uL — ABNORMAL LOW (ref 4.22–5.81)
RDW: 12.7 % (ref 11.5–15.5)
WBC: 6.8 10*3/uL (ref 4.0–10.5)
nRBC: 0 % (ref 0.0–0.2)

## 2020-05-28 MED ORDER — CEFAZOLIN SODIUM-DEXTROSE 1-4 GM/50ML-% IV SOLN
1.0000 g | Freq: Once | INTRAVENOUS | Status: AC
  Administered 2020-05-28: 1 g via INTRAVENOUS
  Filled 2020-05-28: qty 50

## 2020-05-28 MED ORDER — MORPHINE SULFATE (PF) 4 MG/ML IV SOLN
4.0000 mg | Freq: Once | INTRAVENOUS | Status: AC
  Administered 2020-05-28: 4 mg via INTRAVENOUS
  Filled 2020-05-28: qty 1

## 2020-05-28 MED ORDER — ONDANSETRON HCL 4 MG/2ML IJ SOLN
4.0000 mg | Freq: Once | INTRAMUSCULAR | Status: AC
  Administered 2020-05-28: 4 mg via INTRAVENOUS
  Filled 2020-05-28: qty 2

## 2020-05-28 MED ORDER — CEPHALEXIN 500 MG PO CAPS
500.0000 mg | ORAL_CAPSULE | Freq: Four times a day (QID) | ORAL | 0 refills | Status: DC
Start: 2020-05-28 — End: 2020-07-30

## 2020-05-28 NOTE — ED Provider Notes (Signed)
[facility] DEPT Provider Note   CSN: [id number] Arrival date & time: 05/28/20  1216     History Chief Complaint  Patient presents with  . Leg Swelling    Andrew [name] is a 86 y.o. male.  Pt presents to the ED today with right leg pain.  Pt said the pain has been ongoing for several weeks, but it is worse now.  The pt is on Eliquis and has been compliant.  He denies sob or cp.  No f/c.        Past Medical History:  Diagnosis Date  . AAA (abdominal aortic aneurysm) ([place])   . Anemia   . Arthritis   . CAD (coronary artery disease)   . Cancer ([place])    Melanoma - Back  . Carotid artery stenosis   . CHF (congestive heart failure) ([place])   . Cyst of kidney, acquired   . Depression   . Diabetes mellitus   . DVT (deep venous thrombosis) ([place])   . Dysrhythmia   . Fatty liver 2008  . GERD (gastroesophageal reflux disease)   . Hyperlipidemia   . Hypertension   . IBS (irritable bowel syndrome)   . LVF (left ventricular failure) ([place])   . Paroxysmal atrial fibrillation ([facility])   . Pneumonia Jan. 2014  . Shortness of breath dyspnea   . Thyroid disease   . Typical atrial flutter ([place])   . UTI (urinary tract infection) 05/2015   While in [place]   . Vitamin D deficiency     Patient Active Problem List   Diagnosis Date Noted  . Chronic respiratory failure with hypoxia ([place]) 09/16/2018  . Bilateral recurrent inguinal hernias 11/28/2017  . Carotid artery stenosis 01/12/2016  . Chronic systolic CHF (congestive heart failure) ([place]) 12/02/2015  . 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 PVD (peripheral vascular disease) ([place])   . Hereditary and idiopathic peripheral neuropathy 10/02/2015  . Peripelvic (lymphatic) cyst   . Pernicious anemia 07/15/2015  . Bilateral renal cysts 07/12/2015  . Lung nodule, 17mm RML CT 07/12/15 07/12/2015  . Stage 3 chronic kidney disease 06/14/2015  . Paroxysmal atrial fibrillation ([place])  05/28/2015  . AAA (abdominal aortic aneurysm) without rupture ([place]) 09/30/2014  . Cardiomyopathy, ischemic 05/23/2014  . DM (diabetes mellitus), type 2 with renal complications ([place]) 95/08/3266  . Depression, major, single episode, complete remission ([place]) 03/18/2010  . Hypothyroidism 02/03/2009  . Osteoarthritis 05/27/2008  . Hyperlipidemia associated with type 2 diabetes mellitus ([place]) 06/14/2007  . Hypertension associated with diabetes ([place]) 06/14/2007  . GERD 06/14/2007    Past Surgical History:  Procedure Laterality Date  . ABDOMINAL AORTIC ANEURYSM REPAIR  2002  . CARDIAC CATHETERIZATION    . CORONARY ANGIOPLASTY    . CORONARY ANGIOPLASTY WITH STENT PLACEMENT  2005  . CORONARY ARTERY BYPASS GRAFT  1992  . EYE SURGERY     [place]  . HERNIA REPAIR    . INGUINAL HERNIA REPAIR Bilateral    w/mesh  . INSERTION OF MESH N/A 11/28/2017   Procedure: INSERTION OF MESH;  Surgeon: Donnie Mesa, MD;  Location: [facility];  Service: General;  Laterality: N/A;  GENERAL AND TAP BLOCK  . KNEE SURGERY Bilateral   . LAPAROSCOPIC INGUINAL HERNIA WITH UMBILICAL HERNIA Bilateral 11/28/2017   Procedure: LAPAROSCOPIC BILATERAL INGUINAL HERNIA REPAIR WITH MESH, UMBILICAL HERNIA REPAIR WITH MESH;  Surgeon: Donnie Mesa, MD;  Location: [facility];  Service: General;  Laterality: Bilateral;  GENERAL AND TAP  BLOCK  . LEFT HEART CATHETERIZATION WITH CORONARY ANGIOGRAM N/A 10/30/2014   Procedure: LEFT HEART CATHETERIZATION WITH CORONARY ANGIOGRAM;  Surgeon: Larey Dresser, MD;  Location: [facility];  Service: Cardiovascular;  Laterality: N/A;  . QUADRICEPS TENDON REPAIR Bilateral 08/2003   Archie Endo 05/10/2011  . UMBILICAL HERNIA REPAIR  11/28/2017   w/mesh       Family History  Problem Relation Age of Onset  . Colon cancer Father   . Coronary artery disease Brother   . Diabetes Brother   . Heart disease Brother   . Hyperlipidemia Brother   . Peripheral vascular disease Brother        Varicose Veins  .  Throat cancer Brother        abdominal cancer?   . Diabetes Son   . Hyperlipidemia Son     Social History   Tobacco Use  . Smoking status: Former Smoker    Packs/day: 1.50    Years: 35.00    Pack years: 52.50    Types: Cigarettes  . Smokeless tobacco: Never Used  . Tobacco comment: quit atleast 25 yrs ago, per pt  Substance Use Topics  . Alcohol use: Yes    Alcohol/week: 3.0 standard drinks    Types: 3 Shots of liquor per week    Comment: socially  . Drug use: No    Home Medications Prior to Admission medications   Medication Sig Start Date End Date Taking? Authorizing Provider  amiodarone (PACERONE) 200 MG tablet Take 0.5 tablets (100 mg total) by mouth daily. 08/29/19  Yes Larey Dresser, MD  apixaban (ELIQUIS) 2.5 MG TABS tablet Take 1 tablet (2.5 mg total) by mouth 2 (two) times daily. 10/16/19  Yes Larey Dresser, MD  acetaminophen (TYLENOL) 500 MG tablet Take 1,000 mg by mouth every 6 (six) hours as needed for mild pain or headache.    [provider]  atorvastatin (LIPITOR) 80 MG tablet TAKE 1 TABLET EACH DAY. Patient taking differently: Take 80 mg by mouth daily.  05/08/20   Larey Dresser, MD  carvedilol (COREG) 12.5 MG tablet Take 1 tablet (12.5 mg total) by mouth 2 (two) times daily. 04/09/20   Larey Dresser, MD  cephALEXin (KEFLEX) 500 MG capsule Take 1 capsule (500 mg total) by mouth 4 (four) times daily. 05/28/20   Isla Pence, MD  cilostazol (PLETAL) 100 MG tablet Take 1 tablet (100 mg total) by mouth 2 (two) times daily. 07/11/19   Larey Dresser, MD  ezetimibe (ZETIA) 10 MG tablet TAKE (1) TABLET DAILY. 02/28/20   [name], MD  FLUoxetine (PROZAC) 20 MG capsule TAKE (1) CAPSULE DAILY. 02/28/20   [name], MD  furosemide (LASIX) 80 MG tablet TAKE 1&1/2 TABLETS IN THE MORNING AND 1 TABLET IN THE AFTERNOON. 08/01/19   Bensimhon, Shaune Pascal, MD  gabapentin (NEURONTIN) 100 MG capsule Take 100 mg by mouth 3 (three) times daily.    [provider]  glucose blood (ACCU-CHEK AVIVA PLUS) test strip CHECK BLOOD SUGAR 4 TIMES A DAY. 02/19/18   [name], MD  hydrALAZINE (APRESOLINE) 50 MG tablet Take 1 tablet (50 mg total) by mouth 3 (three) times daily. 09/12/19   Clegg, Amy D, NP  Influenza Vac A&B Surf Ant Adj (FLUAD) 0.5 ML SUSY     [provider]  isosorbide mononitrate (IMDUR) 30 MG 24 hr tablet TAKE 1 TABLET EACH DAY. 09/05/19   Larey Dresser, MD  Lancets MISC Check  blood sugar 4 times a day 02/19/18   [name], MD  levothyroxine (SYNTHROID) 100 MCG tablet TAKE 1 TABLET BEFORE BREAKFAST. 04/24/20   Larey Dresser, MD  Multiple Vitamin (MULTIVITAMIN) tablet Take 1 tablet by mouth daily.    [provider]  nitroGLYCERIN (NITROSTAT) 0.4 MG SL tablet DISSOLVE 1 TABLET UNDER TONGUE AS NEEDED FOR CHEST PAIN,MAY REPEAT IN5 MINUTES FOR 2 DOSES. 07/30/19   Larey Dresser, MD  potassium chloride SA (KLOR-CON) 20 MEQ tablet TAKE 1 TABLET ONCE DAILY. 12/06/19   Larey Dresser, MD  SPIRIVA HANDIHALER 18 MCG inhalation capsule PLACE 1 CAPSULE INTO INHALER AND INHALE ONCE DAILY. 01/23/19   Larey Dresser, MD    Allergies    Metoprolol, Metformin, and Metformin and related  Review of Systems   Review of Systems  Musculoskeletal:       Right leg pain  All other systems reviewed and are negative.   Physical Exam Updated Vital Signs BP (!) 164/75   Pulse (!) 59   Temp 98.6 F (37 C)   Resp 18   SpO2 90%   Physical Exam Vitals and nursing note reviewed.  Constitutional:      Appearance: Normal appearance.  HENT:     Head: Normocephalic and atraumatic.     Right Ear: External ear normal.     Left Ear: External ear normal.     Nose: Nose normal.     Mouth/Throat:     Mouth: Mucous membranes are moist.     Pharynx: Oropharynx is clear.  Eyes:     Extraocular Movements: Extraocular movements intact.     Conjunctiva/sclera: Conjunctivae normal.     Pupils: Pupils are equal, round, and  reactive to light.  Cardiovascular:     Rate and Rhythm: Normal rate and regular rhythm.     Pulses: Decreased pulses.          Dorsalis pedis pulses are detected w/ Doppler on the right side and detected w/ Doppler on the left side.       Posterior tibial pulses are detected w/ Doppler on the right side and detected w/ Doppler on the left side.     Heart sounds: Normal heart sounds.  Pulmonary:     Effort: Pulmonary effort is normal.     Breath sounds: Normal breath sounds.  Abdominal:     General: Abdomen is flat. Bowel sounds are normal.     Palpations: Abdomen is soft.  Musculoskeletal:     Cervical back: Normal range of motion and neck supple.     Comments: Right leg tender and swollen with redness  Skin:    General: Skin is warm.     Capillary Refill: Capillary refill takes less than 2 seconds.     Comments: See pictures.  Chronic skin changes, but skin on right is more warm and red than the left.  Neurological:     General: No focal deficit present.     Mental Status: He is alert and oriented to person, place, and time.  Psychiatric:        Mood and Affect: Mood normal.        Behavior: Behavior normal.        Thought Content: Thought content normal.        Judgment: Judgment normal.       ED Results / Procedures / Treatments   Labs (all labs ordered are listed, but only abnormal results are displayed) Labs Reviewed  CBC  WITH DIFFERENTIAL/PLATELET - Abnormal; Notable for the following components:      Result Value   RBC 3.82 (*)    Hemoglobin 12.7 (*)    HCT 38.6 (*)    MCV 101.0 (*)    Platelets 139 (*)    All other components within normal limits  BASIC METABOLIC PANEL - Abnormal; Notable for the following components:   Glucose, Bld 133 (*)    BUN 39 (*)    Creatinine, Ser 1.77 (*)    GFR calc non Af Amer 33 (*)    GFR calc Af Amer 39 (*)    All other components within normal limits    EKG None  Radiology VAS [place] ABI WITH/WO TBI  Result Date:  05/28/2020 LOWER EXTREMITY DOPPLER STUDY Indications: Leg pain.  Comparison Study: 07/06/18 Performing Technologist: June Leap Rdms, Rvt  Examination Guidelines: A complete evaluation includes at minimum, Doppler waveform signals and systolic blood pressure reading at the level of bilateral brachial, anterior tibial, and posterior tibial arteries, when vessel segments are accessible. Bilateral testing is considered an integral part of a complete examination. Photoelectric Plethysmograph (PPG) waveforms and toe systolic pressure readings are included as required and additional duplex testing as needed. Limited examinations for reoccurring indications may be performed as noted.  ABI Findings: +---------+------------------+-----+----------+--------+ Right    Rt Pressure (mmHg)IndexWaveform  Comment  +---------+------------------+-----+----------+--------+ Brachial 173                                       +---------+------------------+-----+----------+--------+ ATA      194               1.12 monophasic         +---------+------------------+-----+----------+--------+ PTA      149               0.86 absent             +---------+------------------+-----+----------+--------+ Great Toe194               1.12                    +---------+------------------+-----+----------+--------+ +---------+------------------+-----+----------+-------+ Left     Lt Pressure (mmHg)IndexWaveform  Comment +---------+------------------+-----+----------+-------+ Brachial 160                                      +---------+------------------+-----+----------+-------+ ATA      255               1.47 triphasic         +---------+------------------+-----+----------+-------+ PTA      255               1.47 monophasic        +---------+------------------+-----+----------+-------+ Great Toe168               0.97                   +---------+------------------+-----+----------+-------+  +-------+-----------+-----------+------------+------------+ ABI/TBIToday's ABIToday's TBIPrevious ABIPrevious TBI +-------+-----------+-----------+------------+------------+ Right  1.12       1.12       1.13        1            +-------+-----------+-----------+------------+------------+ Left   [place]         0.97       [place]  0.93         +-------+-----------+-----------+------------+------------+  Summary: Right: ABI is within normal limits, however this seems to be falsely elevated due to dampened waveforms. Hx noncompressible vessels. Left: Resting left ankle-brachial index indicates noncompressible left lower extremity arteries.  *See table(s) above for measurements and observations.  Electronically signed by Monica Martinez MD on 05/28/2020 at 4:15:15 PM.   Final    VAS [place] LOWER EXTREMITY VENOUS (DVT) (ONLY [facility] & [facility])  Result Date: 05/28/2020  Lower Venous DVTStudy Indications: Edema, and Erythema.  Comparison Study: none Performing Technologist: June Leap RDMS, RVT  Examination Guidelines: A complete evaluation includes B-mode imaging, spectral Doppler, color Doppler, and power Doppler as needed of all accessible portions of each vessel. Bilateral testing is considered an integral part of a complete examination. Limited examinations for reoccurring indications may be performed as noted. The reflux portion of the exam is performed with the patient in reverse Trendelenburg.  +---------+---------------+---------+-----------+----------+--------------+ RIGHT    CompressibilityPhasicitySpontaneityPropertiesThrombus Aging +---------+---------------+---------+-----------+----------+--------------+ CFV      Full           Yes      Yes                                 +---------+---------------+---------+-----------+----------+--------------+ SFJ      Full                                                         +---------+---------------+---------+-----------+----------+--------------+ FV Prox  Full                                                        +---------+---------------+---------+-----------+----------+--------------+ FV Mid   Full                                                        +---------+---------------+---------+-----------+----------+--------------+ FV DistalFull                                                        +---------+---------------+---------+-----------+----------+--------------+ PFV      Full                                                        +---------+---------------+---------+-----------+----------+--------------+ POP      Full           Yes      Yes                                 +---------+---------------+---------+-----------+----------+--------------+ PTV      Full                                                        +---------+---------------+---------+-----------+----------+--------------+  PERO     Full                                                        +---------+---------------+---------+-----------+----------+--------------+     Summary: RIGHT: - There is no evidence of deep vein thrombosis in the lower extremity.  - No cystic structure found in the popliteal fossa.   *See table(s) above for measurements and observations.    Preliminary     Procedures Procedures (including critical care time)  Medications Ordered in ED Medications  morphine 4 MG/ML injection 4 mg (4 mg Intravenous Given 05/28/20 1559)  ondansetron (ZOFRAN) injection 4 mg (4 mg Intravenous Given 05/28/20 1558)  ceFAZolin (ANCEF) IVPB 1 g/50 mL premix (0 g Intravenous Stopped 05/28/20 1715)    ED Course  I have reviewed the triage vital signs and the nursing notes.  Pertinent labs & imaging results that were available during my care of the patient were reviewed by me and considered in my medical decision making (see chart for details).      MDM Rules/Calculators/A&P                       No DVT.  Good pulses with doppler.  Skin is more red and warm, so I think pt has cellulitis.  Pt given a dose of ancef in the ED.    Pt's O2 sats dropped with the morphine.  Pt placed on 2L.  By the time of d/c, he was back to normal oxygen levels on RA.  Final Clinical Impression(s) / ED Diagnoses Final diagnoses:  Cellulitis of right lower extremity    Rx / DC Orders ED Discharge Orders         Ordered    cephALEXin (KEFLEX) 500 MG capsule  4 times daily     05/28/20 1748           Isla Pence, MD 05/28/20 [phone]

## 2020-05-28 NOTE — ED Notes (Signed)
Reevaluated pt and he is currently satting at 86% on room air, pt placed on 1 liter nasal cannula wil continue to monitor

## 2020-05-28 NOTE — ED Notes (Signed)
Pt continues to have O2 sats in low to mid 80s on room air. Pt continued on 2 L by nasal cannula

## 2020-05-28 NOTE — Telephone Encounter (Signed)
Patient's wife called in stating they spoke with their cardiologist about how Andrew [name]'s leg has become very swollen, patient was advised to go to ED but they called wanting to be seen with Dr.Parker could see him today, explained to wife that we had no openings and would recommend him go to ED.

## 2020-05-28 NOTE — ED Notes (Signed)
PTs O2 sats 88-90% on room air. Provider made aware. Provider states pt is stable and able to be D/C based upon evaluation and previous documentation

## 2020-05-28 NOTE — Progress Notes (Signed)
Andrew [name] was seen at home today and reported feeling well with the exception of right lower leg pain. He stated it began swelling last night and was very painful. The extremity was warm to touch but when I attempted to palpate a pedal pulse he advised it was very painful. He was advised to seek evaluation at the ED due to the acute nature of the swelling and pain. His medications were verified and his pillbox was refilled.   [name], EMT 05/28/20

## 2020-05-28 NOTE — ED Triage Notes (Signed)
Pt c/o right leg swelling below the knee for half a week. Sometimes painful. Pt takes Eliquis

## 2020-05-28 NOTE — ED Notes (Addendum)
Pm removed off O2 by provider. Readjusted in bed at high fowlers. pts spo2 reading at 96% at this time.

## 2020-05-28 NOTE — ED Notes (Signed)
After first dose of morphine pts spo2 sats reading between 87-90 %, pt placed on  2L O2 by nasal cannula. Provider aware and at bedside at this time

## 2020-05-28 NOTE — Telephone Encounter (Signed)
FYI

## 2020-05-28 NOTE — ED Notes (Signed)
Ultrasound tech at bedside

## 2020-05-28 NOTE — ED Notes (Signed)
Pt reevaluated pt satting at 98 % on 2 L will monitor to wean

## 2020-06-01 ENCOUNTER — Encounter: Payer: Self-pay | Admitting: Family Medicine

## 2020-06-01 ENCOUNTER — Ambulatory Visit: Payer: Medicare PPO | Admitting: Family Medicine

## 2020-06-01 ENCOUNTER — Other Ambulatory Visit: Payer: Self-pay

## 2020-06-01 VITALS — BP 134/66 | HR 82 | Temp 97.0°F | Ht 68.0 in | Wt 187.0 lb

## 2020-06-01 DIAGNOSIS — I10 Essential (primary) hypertension: Secondary | ICD-10-CM

## 2020-06-01 DIAGNOSIS — L0390 Cellulitis, unspecified: Secondary | ICD-10-CM | POA: Diagnosis not present

## 2020-06-01 DIAGNOSIS — W19XXXA Unspecified fall, initial encounter: Secondary | ICD-10-CM | POA: Diagnosis not present

## 2020-06-01 DIAGNOSIS — I152 Hypertension secondary to endocrine disorders: Secondary | ICD-10-CM

## 2020-06-01 DIAGNOSIS — R5381 Other malaise: Secondary | ICD-10-CM | POA: Insufficient documentation

## 2020-06-01 DIAGNOSIS — M545 Low back pain, unspecified: Secondary | ICD-10-CM

## 2020-06-01 DIAGNOSIS — E1159 Type 2 diabetes mellitus with other circulatory complications: Secondary | ICD-10-CM | POA: Diagnosis not present

## 2020-06-01 MED ORDER — ACETAMINOPHEN 500 MG PO TABS: 1000.0000 mg | ORAL_TABLET | Freq: Three times a day (TID) | ORAL | 0 refills | Status: DC | PRN

## 2020-06-01 MED ORDER — BACLOFEN 10 MG PO TABS: 5.0000 mg | ORAL_TABLET | Freq: Three times a day (TID) | ORAL | 0 refills | Status: DC

## 2020-06-01 NOTE — Progress Notes (Signed)
   Andrew [name] is a 86 y.o. male who presents today for an office visit.  Assessment/Plan:  New/Acute Problems: Cellulitis Seems to be resolving with antibiotics.  He will complete course of Keflex  Left lower back pain No red flags.  Likely muscular strain however given trauma will check plain film to rule out compression fracture.  Will start Tylenol 1000 mg 3 times daily and low-dose baclofen as needed.  He will continue with heating pads.  Discussed reasons to return to care.  Falls We will place referral to physical therapy.  Chronic Problems Addressed Today: Debility Now with 2 recent falls.  He is likely deconditioned.  Will place referral for home health physical therapy.  Hypertension associated with diabetes ([place]) At goal.  Continue hydralazine 50 mg 3 times daily, Imdur 30 mg daily, and Coreg 12.5 mg twice daily.     Subjective:  HPI:  Patient presented to the ED 4 days ago with warmth and redness to right lower extremity.  Was given a dose of cefazolin in the emergency room and was started on cephalexin for cellulitis.  He thinks that the inflammation in his leg has improved significantly.  Unfortunately as he was leaving the emergency room he fell onto his back.  He has been having some back pain since then.  He unfortunately fell again yesterday morning while getting out of the chair.  EMS was called and evaluated patient.  Still has persistent back pain he has not tried anything for it.  Pain predominantly located in left lower back and radiates into his buttocks.  No reported bowel or bladder incontinence.  No specific treatments tried.       Objective:  Physical Exam: BP 134/66   Pulse 82   Temp (!) 97 F (36.1 C)   Ht 5\' 8"  (1.727 m)   Wt 187 lb (84.8 kg)   SpO2 (!) 89%   BMI 28.43 kg/m   Gen: No acute distress, resting comfortably CV: Regular rate and rhythm with no murmurs appreciated Pulm: Normal work of breathing, clear to auscultation bilaterally  with no crackles, wheezes, or rhonchi MSK:  -Back: No deformities.  Tender palpation along left lower lumbar area and left gluteal area.  Lower extremities with full range of motion and full strength.  Right lower extremity without erythema or warmth. Neuro: Grossly normal, moves all extremities Psych: Normal affect and thought content      Andrew [name] M. Jerline Pain, MD 06/01/2020 11:28 AM

## 2020-06-01 NOTE — Patient Instructions (Signed)
It was very nice to see you today!  We will check an xray of your back.  Please take the tylenol and baclofen.  I will place a referral for home health physical therapy.  Please let me know if your back pain is not improved over the next week or so.  Take care, Dr Jerline Pain  Please try these tips to maintain a healthy lifestyle:   Eat at least 3 REAL meals and 1-2 snacks per day.  Aim for no more than 5 hours between eating.  If you eat breakfast, please do so within one hour of getting up.    Each meal should contain half fruits/vegetables, one quarter protein, and one quarter carbs (no bigger than a computer mouse)   Cut down on sweet beverages. This includes juice, soda, and sweet tea.     Drink at least 1 glass of water with each meal and aim for at least 8 glasses per day   Exercise at least 150 minutes every week.

## 2020-06-01 NOTE — Assessment & Plan Note (Signed)
At goal.  Continue hydralazine 50 mg 3 times daily, Imdur 30 mg daily, and Coreg 12.5 mg twice daily.

## 2020-06-01 NOTE — Assessment & Plan Note (Signed)
Now with 2 recent falls.  He is likely deconditioned.  Will place referral for home health physical therapy.

## 2020-06-02 ENCOUNTER — Inpatient Hospital Stay (HOSPITAL_COMMUNITY)
Admission: EM | Admit: 2020-06-02 | Discharge: 2020-06-12 | DRG: 291 | Disposition: A | Discharge status: Skilled Nursing Facility | Payer: Medicare PPO | Attending: Internal Medicine | Admitting: Internal Medicine

## 2020-06-02 ENCOUNTER — Other Ambulatory Visit: Payer: Self-pay

## 2020-06-02 ENCOUNTER — Emergency Department (HOSPITAL_COMMUNITY): Payer: Medicare PPO

## 2020-06-02 ENCOUNTER — Telehealth: Payer: Self-pay | Admitting: Family Medicine

## 2020-06-02 ENCOUNTER — Encounter (HOSPITAL_COMMUNITY): Payer: Self-pay | Admitting: Emergency Medicine

## 2020-06-02 ENCOUNTER — Other Ambulatory Visit: Payer: Self-pay | Admitting: Physician Assistant

## 2020-06-02 DIAGNOSIS — K229 Disease of esophagus, unspecified: Secondary | ICD-10-CM | POA: Diagnosis not present

## 2020-06-02 DIAGNOSIS — Z951 Presence of aortocoronary bypass graft: Secondary | ICD-10-CM

## 2020-06-02 DIAGNOSIS — K760 Fatty (change of) liver, not elsewhere classified: Secondary | ICD-10-CM | POA: Diagnosis present

## 2020-06-02 DIAGNOSIS — B3781 Candidal esophagitis: Secondary | ICD-10-CM | POA: Diagnosis not present

## 2020-06-02 DIAGNOSIS — I714 Abdominal aortic aneurysm, without rupture: Secondary | ICD-10-CM | POA: Diagnosis present

## 2020-06-02 DIAGNOSIS — R41841 Cognitive communication deficit: Secondary | ICD-10-CM | POA: Diagnosis not present

## 2020-06-02 DIAGNOSIS — L03116 Cellulitis of left lower limb: Secondary | ICD-10-CM | POA: Diagnosis present

## 2020-06-02 DIAGNOSIS — Z955 Presence of coronary angioplasty implant and graft: Secondary | ICD-10-CM

## 2020-06-02 DIAGNOSIS — G9341 Metabolic encephalopathy: Secondary | ICD-10-CM | POA: Diagnosis not present

## 2020-06-02 DIAGNOSIS — T380X5A Adverse effect of glucocorticoids and synthetic analogues, initial encounter: Secondary | ICD-10-CM | POA: Diagnosis not present

## 2020-06-02 DIAGNOSIS — K219 Gastro-esophageal reflux disease without esophagitis: Secondary | ICD-10-CM | POA: Diagnosis present

## 2020-06-02 DIAGNOSIS — E559 Vitamin D deficiency, unspecified: Secondary | ICD-10-CM | POA: Diagnosis present

## 2020-06-02 DIAGNOSIS — R0902 Hypoxemia: Secondary | ICD-10-CM | POA: Diagnosis not present

## 2020-06-02 DIAGNOSIS — Z20822 Contact with and (suspected) exposure to covid-19: Secondary | ICD-10-CM | POA: Diagnosis present

## 2020-06-02 DIAGNOSIS — J441 Chronic obstructive pulmonary disease with (acute) exacerbation: Secondary | ICD-10-CM | POA: Diagnosis not present

## 2020-06-02 DIAGNOSIS — I130 Hypertensive heart and chronic kidney disease with heart failure and stage 1 through stage 4 chronic kidney disease, or unspecified chronic kidney disease: Principal | ICD-10-CM | POA: Diagnosis present

## 2020-06-02 DIAGNOSIS — I5022 Chronic systolic (congestive) heart failure: Secondary | ICD-10-CM | POA: Diagnosis not present

## 2020-06-02 DIAGNOSIS — Z833 Family history of diabetes mellitus: Secondary | ICD-10-CM

## 2020-06-02 DIAGNOSIS — J690 Pneumonitis due to inhalation of food and vomit: Secondary | ICD-10-CM | POA: Diagnosis not present

## 2020-06-02 DIAGNOSIS — J9811 Atelectasis: Secondary | ICD-10-CM | POA: Diagnosis not present

## 2020-06-02 DIAGNOSIS — I483 Typical atrial flutter: Secondary | ICD-10-CM | POA: Diagnosis present

## 2020-06-02 DIAGNOSIS — R1313 Dysphagia, pharyngeal phase: Secondary | ICD-10-CM | POA: Diagnosis present

## 2020-06-02 DIAGNOSIS — K3189 Other diseases of stomach and duodenum: Secondary | ICD-10-CM | POA: Diagnosis not present

## 2020-06-02 DIAGNOSIS — I4891 Unspecified atrial fibrillation: Secondary | ICD-10-CM | POA: Diagnosis not present

## 2020-06-02 DIAGNOSIS — I5023 Acute on chronic systolic (congestive) heart failure: Secondary | ICD-10-CM | POA: Diagnosis not present

## 2020-06-02 DIAGNOSIS — K589 Irritable bowel syndrome without diarrhea: Secondary | ICD-10-CM | POA: Diagnosis present

## 2020-06-02 DIAGNOSIS — I480 Paroxysmal atrial fibrillation: Secondary | ICD-10-CM | POA: Diagnosis present

## 2020-06-02 DIAGNOSIS — K921 Melena: Secondary | ICD-10-CM | POA: Diagnosis not present

## 2020-06-02 DIAGNOSIS — M1990 Unspecified osteoarthritis, unspecified site: Secondary | ICD-10-CM | POA: Diagnosis present

## 2020-06-02 DIAGNOSIS — N183 Chronic kidney disease, stage 3 unspecified: Secondary | ICD-10-CM | POA: Diagnosis not present

## 2020-06-02 DIAGNOSIS — K2270 Barrett's esophagus without dysplasia: Secondary | ICD-10-CM | POA: Diagnosis present

## 2020-06-02 DIAGNOSIS — Z800 Family history of malignant neoplasm of digestive organs: Secondary | ICD-10-CM

## 2020-06-02 DIAGNOSIS — I10 Essential (primary) hypertension: Secondary | ICD-10-CM | POA: Diagnosis not present

## 2020-06-02 DIAGNOSIS — S0990XA Unspecified injury of head, initial encounter: Secondary | ICD-10-CM | POA: Diagnosis not present

## 2020-06-02 DIAGNOSIS — R2689 Other abnormalities of gait and mobility: Secondary | ICD-10-CM | POA: Diagnosis not present

## 2020-06-02 DIAGNOSIS — I878 Other specified disorders of veins: Secondary | ICD-10-CM | POA: Diagnosis present

## 2020-06-02 DIAGNOSIS — R195 Other fecal abnormalities: Secondary | ICD-10-CM | POA: Diagnosis not present

## 2020-06-02 DIAGNOSIS — Z8249 Family history of ischemic heart disease and other diseases of the circulatory system: Secondary | ICD-10-CM

## 2020-06-02 DIAGNOSIS — Z85820 Personal history of malignant melanoma of skin: Secondary | ICD-10-CM

## 2020-06-02 DIAGNOSIS — F17210 Nicotine dependence, cigarettes, uncomplicated: Secondary | ICD-10-CM | POA: Diagnosis present

## 2020-06-02 DIAGNOSIS — W1830XA Fall on same level, unspecified, initial encounter: Secondary | ICD-10-CM | POA: Diagnosis present

## 2020-06-02 DIAGNOSIS — J90 Pleural effusion, not elsewhere classified: Secondary | ICD-10-CM | POA: Diagnosis not present

## 2020-06-02 DIAGNOSIS — J9602 Acute respiratory failure with hypercapnia: Secondary | ICD-10-CM | POA: Diagnosis not present

## 2020-06-02 DIAGNOSIS — E875 Hyperkalemia: Secondary | ICD-10-CM | POA: Diagnosis not present

## 2020-06-02 DIAGNOSIS — K922 Gastrointestinal hemorrhage, unspecified: Secondary | ICD-10-CM | POA: Diagnosis not present

## 2020-06-02 DIAGNOSIS — N184 Chronic kidney disease, stage 4 (severe): Secondary | ICD-10-CM | POA: Diagnosis not present

## 2020-06-02 DIAGNOSIS — Z86718 Personal history of other venous thrombosis and embolism: Secondary | ICD-10-CM

## 2020-06-02 DIAGNOSIS — J9601 Acute respiratory failure with hypoxia: Secondary | ICD-10-CM | POA: Diagnosis not present

## 2020-06-02 DIAGNOSIS — R262 Difficulty in walking, not elsewhere classified: Secondary | ICD-10-CM | POA: Diagnosis not present

## 2020-06-02 DIAGNOSIS — Z743 Need for continuous supervision: Secondary | ICD-10-CM | POA: Diagnosis not present

## 2020-06-02 DIAGNOSIS — R9431 Abnormal electrocardiogram [ECG] [EKG]: Secondary | ICD-10-CM | POA: Diagnosis not present

## 2020-06-02 DIAGNOSIS — E1122 Type 2 diabetes mellitus with diabetic chronic kidney disease: Secondary | ICD-10-CM | POA: Diagnosis not present

## 2020-06-02 DIAGNOSIS — T17908A Unspecified foreign body in respiratory tract, part unspecified causing other injury, initial encounter: Secondary | ICD-10-CM | POA: Diagnosis not present

## 2020-06-02 DIAGNOSIS — Z7401 Bed confinement status: Secondary | ICD-10-CM | POA: Diagnosis not present

## 2020-06-02 DIAGNOSIS — Z03818 Encounter for observation for suspected exposure to other biological agents ruled out: Secondary | ICD-10-CM | POA: Diagnosis not present

## 2020-06-02 DIAGNOSIS — S0093XA Contusion of unspecified part of head, initial encounter: Secondary | ICD-10-CM | POA: Diagnosis present

## 2020-06-02 DIAGNOSIS — E785 Hyperlipidemia, unspecified: Secondary | ICD-10-CM | POA: Diagnosis present

## 2020-06-02 DIAGNOSIS — D631 Anemia in chronic kidney disease: Secondary | ICD-10-CM | POA: Diagnosis present

## 2020-06-02 DIAGNOSIS — K228 Other specified diseases of esophagus: Secondary | ICD-10-CM | POA: Diagnosis not present

## 2020-06-02 DIAGNOSIS — M2550 Pain in unspecified joint: Secondary | ICD-10-CM | POA: Diagnosis not present

## 2020-06-02 DIAGNOSIS — R269 Unspecified abnormalities of gait and mobility: Secondary | ICD-10-CM | POA: Diagnosis not present

## 2020-06-02 DIAGNOSIS — R404 Transient alteration of awareness: Secondary | ICD-10-CM | POA: Diagnosis not present

## 2020-06-02 DIAGNOSIS — E039 Hypothyroidism, unspecified: Secondary | ICD-10-CM | POA: Diagnosis not present

## 2020-06-02 DIAGNOSIS — D649 Anemia, unspecified: Secondary | ICD-10-CM | POA: Diagnosis not present

## 2020-06-02 DIAGNOSIS — S199XXA Unspecified injury of neck, initial encounter: Secondary | ICD-10-CM | POA: Diagnosis not present

## 2020-06-02 DIAGNOSIS — S3993XA Unspecified injury of pelvis, initial encounter: Secondary | ICD-10-CM | POA: Diagnosis not present

## 2020-06-02 DIAGNOSIS — Z7901 Long term (current) use of anticoagulants: Secondary | ICD-10-CM | POA: Diagnosis not present

## 2020-06-02 DIAGNOSIS — I6529 Occlusion and stenosis of unspecified carotid artery: Secondary | ICD-10-CM | POA: Diagnosis present

## 2020-06-02 DIAGNOSIS — E876 Hypokalemia: Secondary | ICD-10-CM | POA: Diagnosis not present

## 2020-06-02 DIAGNOSIS — Z808 Family history of malignant neoplasm of other organs or systems: Secondary | ICD-10-CM

## 2020-06-02 DIAGNOSIS — R2681 Unsteadiness on feet: Secondary | ICD-10-CM | POA: Diagnosis not present

## 2020-06-02 DIAGNOSIS — M25559 Pain in unspecified hip: Secondary | ICD-10-CM | POA: Diagnosis not present

## 2020-06-02 DIAGNOSIS — Z79890 Hormone replacement therapy (postmenopausal): Secondary | ICD-10-CM

## 2020-06-02 DIAGNOSIS — R410 Disorientation, unspecified: Secondary | ICD-10-CM | POA: Diagnosis not present

## 2020-06-02 DIAGNOSIS — Y92019 Unspecified place in single-family (private) house as the place of occurrence of the external cause: Secondary | ICD-10-CM

## 2020-06-02 DIAGNOSIS — Z83438 Family history of other disorder of lipoprotein metabolism and other lipidemia: Secondary | ICD-10-CM

## 2020-06-02 DIAGNOSIS — M6281 Muscle weakness (generalized): Secondary | ICD-10-CM | POA: Diagnosis not present

## 2020-06-02 DIAGNOSIS — R6889 Other general symptoms and signs: Secondary | ICD-10-CM | POA: Diagnosis not present

## 2020-06-02 HISTORY — DX: Chronic obstructive pulmonary disease, unspecified: J44.9

## 2020-06-02 LAB — COMPREHENSIVE METABOLIC PANEL
ALT: 30 U/L (ref 0–44)
AST: 47 U/L — ABNORMAL HIGH (ref 15–41)
Albumin: 3.6 g/dL (ref 3.5–5.0)
Alkaline Phosphatase: 65 U/L (ref 38–126)
Anion gap: 13 (ref 5–15)
BUN: 42 mg/dL — ABNORMAL HIGH (ref 8–23)
CO2: 25 mmol/L (ref 22–32)
Calcium: 9.2 mg/dL (ref 8.9–10.3)
Chloride: 103 mmol/L (ref 98–111)
Creatinine, Ser: 2.02 mg/dL — ABNORMAL HIGH (ref 0.61–1.24)
GFR calc Af Amer: 33 mL/min — ABNORMAL LOW (ref >60)
GFR calc non Af Amer: 28 mL/min — ABNORMAL LOW (ref >60)
Glucose, Bld: 149 mg/dL — ABNORMAL HIGH (ref 70–99)
Potassium: 3.8 mmol/L (ref 3.5–5.1)
Sodium: 141 mmol/L (ref 135–145)
Total Bilirubin: 1 mg/dL (ref 0.3–1.2)
Total Protein: 6.9 g/dL (ref 6.5–8.1)

## 2020-06-02 LAB — I-STAT ARTERIAL BLOOD GAS, ED
Acid-Base Excess: 3 mmol/L — ABNORMAL HIGH (ref 0.0–2.0)
Acid-Base Excess: 5 mmol/L — ABNORMAL HIGH (ref 0.0–2.0)
Bicarbonate: 32.6 mmol/L — ABNORMAL HIGH (ref 20.0–28.0)
Bicarbonate: 31.6 mmol/L — ABNORMAL HIGH (ref 20.0–28.0)
Calcium, Ion: 1.27 mmol/L (ref 1.15–1.40)
Calcium, Ion: 1.23 mmol/L (ref 1.15–1.40)
HCT: 43 % (ref 39.0–52.0)
HCT: 42 % (ref 39.0–52.0)
Hemoglobin: 14.6 g/dL (ref 13.0–17.0)
Hemoglobin: 14.3 g/dL (ref 13.0–17.0)
O2 Saturation: 97 %
O2 Saturation: 97 %
Patient temperature: 97.7
Patient temperature: 97.9
Potassium: 3.8 mmol/L (ref 3.5–5.1)
Potassium: 3.5 mmol/L (ref 3.5–5.1)
Sodium: 142 mmol/L (ref 135–145)
Sodium: 144 mmol/L (ref 135–145)
TCO2: 35 mmol/L — ABNORMAL HIGH (ref 22–32)
TCO2: 33 mmol/L — ABNORMAL HIGH (ref 22–32)
pCO2 arterial: 73.7 mmHg (ref 32.0–48.0)
pCO2 arterial: 53.5 mmHg — ABNORMAL HIGH (ref 32.0–48.0)
pH, Arterial: 7.251 — ABNORMAL LOW (ref 7.350–7.450)
pH, Arterial: 7.377 (ref 7.350–7.450)
pO2, Arterial: 114 mmHg — ABNORMAL HIGH (ref 83.0–108.0)
pO2, Arterial: 92 mmHg (ref 83.0–108.0)

## 2020-06-02 LAB — URINALYSIS, ROUTINE W REFLEX MICROSCOPIC
Bilirubin Urine: NEGATIVE
Glucose, UA: NEGATIVE mg/dL
Ketones, ur: NEGATIVE mg/dL
Leukocytes,Ua: NEGATIVE
Nitrite: NEGATIVE
Protein, ur: 100 mg/dL — AB
Specific Gravity, Urine: 1.009 (ref 1.005–1.030)
pH: 7 (ref 5.0–8.0)

## 2020-06-02 LAB — CBC WITH DIFFERENTIAL/PLATELET
Abs Immature Granulocytes: 0.02 10*3/uL (ref 0.00–0.07)
Basophils Absolute: 0 10*3/uL (ref 0.0–0.1)
Basophils Relative: 0 %
Eosinophils Absolute: 0.2 10*3/uL (ref 0.0–0.5)
Eosinophils Relative: 3 %
HCT: 39.4 % (ref 39.0–52.0)
Hemoglobin: 12.9 g/dL — ABNORMAL LOW (ref 13.0–17.0)
Immature Granulocytes: 0 %
Lymphocytes Relative: 21 %
Lymphs Abs: 1.4 10*3/uL (ref 0.7–4.0)
MCH: 33.2 pg (ref 26.0–34.0)
MCHC: 32.7 g/dL (ref 30.0–36.0)
MCV: 101.5 fL — ABNORMAL HIGH (ref 80.0–100.0)
Monocytes Absolute: 0.6 10*3/uL (ref 0.1–1.0)
Monocytes Relative: 9 %
Neutro Abs: 4.5 10*3/uL (ref 1.7–7.7)
Neutrophils Relative %: 67 %
Platelets: 136 10*3/uL — ABNORMAL LOW (ref 150–400)
RBC: 3.88 MIL/uL — ABNORMAL LOW (ref 4.22–5.81)
RDW: 12.9 % (ref 11.5–15.5)
WBC: 6.9 10*3/uL (ref 4.0–10.5)
nRBC: 0 % (ref 0.0–0.2)

## 2020-06-02 LAB — TROPONIN I (HIGH SENSITIVITY)
Troponin I (High Sensitivity): 23 ng/L — ABNORMAL HIGH (ref <18)
Troponin I (High Sensitivity): 31 ng/L — ABNORMAL HIGH (ref <18)

## 2020-06-02 LAB — SARS CORONAVIRUS 2 BY RT PCR (HOSPITAL ORDER, PERFORMED IN ~~LOC~~ HOSPITAL LAB): SARS Coronavirus 2: NEGATIVE

## 2020-06-02 LAB — CBG MONITORING, ED: Glucose-Capillary: 150 mg/dL — ABNORMAL HIGH (ref 70–99)

## 2020-06-02 LAB — TSH: TSH: 1.138 u[IU]/mL (ref 0.350–4.500)

## 2020-06-02 MED ORDER — IPRATROPIUM-ALBUTEROL 0.5-2.5 (3) MG/3ML IN SOLN: 3.0000 mL | Freq: Four times a day (QID) | RESPIRATORY_TRACT | Status: DC | PRN

## 2020-06-02 MED ORDER — ACETAMINOPHEN 650 MG RE SUPP
650.0000 mg | Freq: Four times a day (QID) | RECTAL | Status: DC | PRN
  Administered 2020-06-04 – 2020-06-05 (×2): 650 mg via RECTAL
  Filled 2020-06-02 (×2): qty 1

## 2020-06-02 MED ORDER — LEVOTHYROXINE SODIUM 100 MCG PO TABS: 100.0000 ug | ORAL_TABLET | Freq: Every day | ORAL | Status: DC

## 2020-06-02 MED ORDER — SODIUM CHLORIDE 0.9% FLUSH
3.0000 mL | Freq: Two times a day (BID) | INTRAVENOUS | Status: DC
  Administered 2020-06-02 – 2020-06-12 (×20): 3 mL via INTRAVENOUS

## 2020-06-02 MED ORDER — APIXABAN 2.5 MG PO TABS
2.5000 mg | ORAL_TABLET | Freq: Two times a day (BID) | ORAL | Status: DC
  Filled 2020-06-02: qty 1

## 2020-06-02 MED ORDER — ALBUTEROL SULFATE (2.5 MG/3ML) 0.083% IN NEBU: 2.5000 mg | INHALATION_SOLUTION | Freq: Four times a day (QID) | RESPIRATORY_TRACT | Status: DC | PRN

## 2020-06-02 MED ORDER — CARVEDILOL 12.5 MG PO TABS: 12.5000 mg | ORAL_TABLET | Freq: Two times a day (BID) | ORAL | Status: DC

## 2020-06-02 MED ORDER — ISOSORBIDE MONONITRATE ER 30 MG PO TB24
30.0000 mg | ORAL_TABLET | Freq: Every day | ORAL | Status: DC
  Administered 2020-06-04 – 2020-06-12 (×8): 30 mg via ORAL
  Filled 2020-06-02 (×8): qty 1

## 2020-06-02 MED ORDER — TEMAZEPAM 15 MG PO CAPS
30.0000 mg | ORAL_CAPSULE | Freq: Every evening | ORAL | Status: DC | PRN
  Administered 2020-06-03 – 2020-06-11 (×8): 30 mg via ORAL
  Filled 2020-06-02 (×9): qty 2

## 2020-06-02 MED ORDER — ONDANSETRON HCL 4 MG PO TABS: 4.0000 mg | ORAL_TABLET | Freq: Four times a day (QID) | ORAL | Status: DC | PRN

## 2020-06-02 MED ORDER — ATORVASTATIN CALCIUM 80 MG PO TABS
80.0000 mg | ORAL_TABLET | Freq: Every day | ORAL | Status: DC
  Administered 2020-06-04 – 2020-06-12 (×9): 80 mg via ORAL
  Filled 2020-06-02 (×10): qty 1

## 2020-06-02 MED ORDER — CILOSTAZOL 100 MG PO TABS
100.0000 mg | ORAL_TABLET | Freq: Two times a day (BID) | ORAL | Status: DC
  Administered 2020-06-04 – 2020-06-08 (×9): 100 mg via ORAL
  Filled 2020-06-02 (×11): qty 1

## 2020-06-02 MED ORDER — LORAZEPAM 2 MG/ML IJ SOLN
1.0000 mg | Freq: Once | INTRAMUSCULAR | Status: AC
  Administered 2020-06-02: 1 mg via INTRAVENOUS
  Filled 2020-06-02: qty 1

## 2020-06-02 MED ORDER — HEPARIN (PORCINE) 25000 UT/250ML-% IV SOLN
1000.0000 [IU]/h | INTRAVENOUS | Status: DC
  Administered 2020-06-02 – 2020-06-03 (×2): 1200 [IU]/h via INTRAVENOUS
  Filled 2020-06-02 (×2): qty 250

## 2020-06-02 MED ORDER — HYDRALAZINE HCL 20 MG/ML IJ SOLN
10.0000 mg | INTRAMUSCULAR | Status: DC | PRN
  Administered 2020-06-02 – 2020-06-05 (×3): 10 mg via INTRAVENOUS
  Filled 2020-06-02 (×3): qty 1

## 2020-06-02 MED ORDER — ACETAMINOPHEN 325 MG PO TABS
650.0000 mg | ORAL_TABLET | Freq: Four times a day (QID) | ORAL | Status: DC | PRN
  Administered 2020-06-03 – 2020-06-04 (×2): 650 mg via ORAL
  Filled 2020-06-02 (×2): qty 2

## 2020-06-02 MED ORDER — FLUOXETINE HCL 20 MG PO CAPS
20.0000 mg | ORAL_CAPSULE | Freq: Every day | ORAL | Status: DC
  Administered 2020-06-04 – 2020-06-12 (×9): 20 mg via ORAL
  Filled 2020-06-02 (×10): qty 1

## 2020-06-02 MED ORDER — EZETIMIBE 10 MG PO TABS
10.0000 mg | ORAL_TABLET | Freq: Every day | ORAL | Status: DC
  Administered 2020-06-04 – 2020-06-12 (×9): 10 mg via ORAL
  Filled 2020-06-02 (×10): qty 1

## 2020-06-02 MED ORDER — AMIODARONE HCL 200 MG PO TABS
100.0000 mg | ORAL_TABLET | Freq: Every day | ORAL | Status: DC
  Administered 2020-06-04 – 2020-06-12 (×8): 100 mg via ORAL
  Filled 2020-06-02 (×9): qty 1

## 2020-06-02 MED ORDER — ONDANSETRON HCL 4 MG/2ML IJ SOLN: 4.0000 mg | Freq: Four times a day (QID) | INTRAMUSCULAR | Status: DC | PRN

## 2020-06-02 MED ORDER — HYDRALAZINE HCL 50 MG PO TABS
50.0000 mg | ORAL_TABLET | Freq: Three times a day (TID) | ORAL | Status: DC
  Administered 2020-06-03 – 2020-06-05 (×6): 50 mg via ORAL
  Filled 2020-06-02 (×6): qty 1

## 2020-06-02 NOTE — Progress Notes (Signed)
Orthopedic Tech Progress Note Patient Details:  Andrew [name] December 15, 1928 [id number] Level 2 trauma. Patient ID: Andrew [name], male   DOB: 1928/01/03, 86 y.o.   MRN: [id number]   Janit Pagan 06/02/2020, 11:37 AM

## 2020-06-02 NOTE — Progress Notes (Signed)
ANTICOAGULATION CONSULT NOTE - Initial Consult  Pharmacy Consult for heparin Indication: atrial fibrillation  No Known Allergies  Patient Measurements: Height: 5\' 8"  (172.7 cm) Weight: 84.8 kg (186 lb 15.2 oz) IBW/kg (Calculated) : 68.4 Heparin Dosing Weight: 84.8kg  Vital Signs: Temp: 97.9 F (36.6 C) (06/08 1122) Temp Source: Oral (06/08 1122) BP: 164/93 (06/08 2045) Pulse Rate: 78 (06/08 2045)  Labs: Recent Labs    06/02/20 1121 06/02/20 1121 06/02/20 1312 06/02/20 1324 06/02/20 1556  HGB 12.9*   < > 14.6  --  14.3  HCT 39.4  --  43.0  --  42.0  PLT 136*  --   --   --   --   CREATININE 2.02*  --   --   --   --   TROPONINIHS 23*  --   --  31*  --    < > = values in this interval not displayed.    Estimated Creatinine Clearance: 26.3 mL/min (A) (by C-G formula based on SCr of 2.02 mg/dL (H)).   Medical History: Past Medical History:  Diagnosis Date  . CHF (congestive heart failure) ([facility])     Medications:  Infusions:  . heparin      Assessment: 27 yom presented to the ED s/p fall with AMS. He is on chronic apixaban for history of afib but he is currently unable to take PO. To start IV heparin in the meantime. Hgb is WNL but platelets are slightly low. No bleeding noted.   Goal of Therapy:  Heparin level 0.3-0.7 units/ml aPTT 66-103 seconds Monitor platelets by anticoagulation protocol: Yes   Plan:  Heparin gtt 1200 units/hr Check an 8 hr heparin level and aPTT Daily heparin level, aPTT and CBC  Jayd Forrey, Rande Lawman 06/02/2020,9:04 PM

## 2020-06-02 NOTE — ED Triage Notes (Signed)
Pt present from home with wife for fall after getting dizzy while getting up from chair this AM. Pt fell forward and hit his head on carpet. Pt takes eliquis. Wife notes increased confusion, (normally A&Ox4, now x1)

## 2020-06-02 NOTE — ED Provider Notes (Signed)
[facility] Provider Note   CSN: [id number] Arrival date & time: 06/02/20  1112     History Chief Complaint  Patient presents with  . Fall    Andrew [name] is a 86 y.o. male.  Pt presents to the ED today with a fall.  The pt is from home.  He got up from a chair, became dizzy, and fell hitting his head.  He became more confused after the fall.  He is on Eliquis.  The pt is unable to give me any hx.  He is very agitated.  Pt was initially made a Doe and a trauma 2, so his chart has to be merged.    Pt's nurse spoke with wife.  He has fallen several times recently.  He was also recently started on Baclofen.        Past Medical History:  Diagnosis Date  . CHF (congestive heart failure) (HCC)     There are no problems to display for this patient.  Medical History   25 items 05/2015 UTI (urinary tract infection) Jan. 2014 Pneumonia 2008 Fatty liver Date Unknown AAA (abdominal aortic aneurysm) (HCC) Date Unknown Anemia Date Unknown Arthritis Date Unknown CAD (coronary artery disease) Date Unknown Cancer [facility]) Date Unknown Carotid artery stenosis Date Unknown CHF (congestive heart failure) (HCC) Date Unknown Cyst of kidney, acquired Date Unknown Depression Date Unknown Diabetes mellitus Date Unknown DVT (deep venous thrombosis) (HCC) Date Unknown Dysrhythmia Date Unknown GERD (gastroesophageal reflux disease) Date Unknown Hyperlipidemia Date Unknown Hypertension Date Unknown IBS (irritable bowel syndrome) Date Unknown LVF (left ventricular failure) ([place]) Date Unknown Paroxysmal atrial fibrillation (HCC) Date Unknown Shortness of breath dyspnea Date Unknown Thyroid disease Date Unknown Typical atrial flutter (HCC) Date Unknown Vitamin D deficiency    Social History   Tobacco Use  . Smoking status: Not on file  Substance Use Topics  . Alcohol use: Not on file  . Drug use: Not on file   Tobacco History   5 items Smoking Status Former Smoker Types Cigarettes Amount 1.5 packs/day for 35 years (52.50 pk-yrs) Smokeless Tobacco Status Never Used Comment quit atleast 25 yrs ago, per pt  Home Medications Prior to Admission medications   Not on File   Medications   acetaminophen (TYLENOL) 500 MG tablet amiodarone (PACERONE) 200 MG tablet apixaban (ELIQUIS) 2.5 MG TABS tablet atorvastatin (LIPITOR) 80 MG tablet baclofen (LIORESAL) 10 MG tablet carvedilol (COREG) 12.5 MG tablet cephALEXin (KEFLEX) 500 MG capsule cilostazol (PLETAL) 100 MG tablet ezetimibe (ZETIA) 10 MG tablet FLUoxetine (PROZAC) 20 MG capsule furosemide (LASIX) 80 MG tablet gabapentin (NEURONTIN) 100 MG capsule glucose blood (ACCU-CHEK AVIVA PLUS) test strip hydrALAZINE (APRESOLINE) 50 MG tablet Influenza Vac A&B Surf Ant Adj (FLUAD) 0.5 ML SUSY isosorbide mononitrate (IMDUR) 30 MG 24 hr tablet Lancets MISC levothyroxine (SYNTHROID) 100 MCG tablet Multiple Vitamin (MULTIVITAMIN) tablet nitroGLYCERIN (NITROSTAT) 0.4 MG SL tablet potassium chloride SA (KLOR-CON) 20 MEQ tablet SPIRIVA HANDIHALER 18 MCG inhalation capsule traMADol (ULTRAM) 50 MG tablet    Surgical History   14 items 56/38/9373 Umbilical hernia repair 42/07/7680 Laparoscopic inguinal hernia with umbilical hernia(Bilateral) 11/28/2017 Insertion of mesh(N/A) 10/30/2014 Left heart catheterization with coronary angiogram(N/A) 2005 Coronary angioplasty with stent placement 08/2003 Quadriceps tendon repair(Bilateral) 2002 Abdominal aortic aneurysm repair 1992 Coronary artery bypass graft Date Unknown Cardiac catheterization Date Unknown Coronary angioplasty Date Unknown Eye surgery Date Unknown Hernia repair Date Unknown Inguinal hernia repair(Bilateral) Date Unknown Knee surgery(Bilateral)  Fam hx: Family History   8  items    Father (Deceased)  Colon cancer  Brother (Deceased)  Coronary artery  disease   Diabetes   Heart disease   Hyperlipidemia   Peripheral vascular disease    Throat cancer   Mother (Deceased)    Maternal Grandfather (Deceased)    Maternal Grandmother (Deceased)    Paternal Grandfather (Deceased)    Paternal Grandmother (Deceased)    Son Diabetes   Hyperlipidemia    Allergies    Patient has no allergy information on record.  Review of Systems   Review of Systems  Unable to perform ROS: Mental status change    Physical Exam Updated Vital Signs BP (!) 196/87   Pulse 73   Temp 97.9 F (36.6 C) (Oral)   Resp (!) 22   Ht 5\' 10"  (1.778 m)   Wt 113.4 kg   SpO2 95%   BMI 35.87 kg/m   Physical Exam Vitals and nursing note reviewed.  Constitutional:      Appearance: Normal appearance.  HENT:     Head: Normocephalic.     Comments: Large contusion right side of head    Right Ear: External ear normal.     Left Ear: External ear normal.     Nose: Nose normal.     Mouth/Throat:     Mouth: Mucous membranes are moist.     Pharynx: Oropharynx is clear.  Eyes:     Extraocular Movements: Extraocular movements intact.     Conjunctiva/sclera: Conjunctivae normal.     Pupils: Pupils are equal, round, and reactive to light.  Neck:     Comments: In c-collar Cardiovascular:     Rate and Rhythm: Normal rate and regular rhythm.     Pulses: Normal pulses.     Heart sounds: Normal heart sounds.  Pulmonary:     Effort: Pulmonary effort is normal.     Breath sounds: Normal breath sounds.  Abdominal:     General: Abdomen is flat. Bowel sounds are normal.     Palpations: Abdomen is soft.  Musculoskeletal:        General: Normal range of motion.  Skin:    General: Skin is warm.     Capillary Refill: Capillary refill takes less than 2 seconds.  Neurological:     Mental Status: He is alert. He is disoriented.  Psychiatric:        Behavior: Behavior is agitated.     ED Results / Procedures / Treatments   Labs (all labs ordered are listed, but  only abnormal results are displayed) Labs Reviewed  CBC WITH DIFFERENTIAL/PLATELET - Abnormal; Notable for the following components:      Result Value   RBC 3.88 (*)    Hemoglobin 12.9 (*)    MCV 101.5 (*)    Platelets 136 (*)    All other components within normal limits  COMPREHENSIVE METABOLIC PANEL - Abnormal; Notable for the following components:   Glucose, Bld 149 (*)    BUN 42 (*)    Creatinine, Ser 2.02 (*)    AST 47 (*)    GFR calc non Af Amer 28 (*)    GFR calc Af Amer 33 (*)    All other components within normal limits  URINALYSIS, ROUTINE W REFLEX MICROSCOPIC - Abnormal; Notable for the following components:   Hgb urine dipstick SMALL (*)    Protein, ur 100 (*)    Bacteria, UA RARE (*)    All other components within normal limits  CBG MONITORING, ED -  Abnormal; Notable for the following components:   Glucose-Capillary 150 (*)    All other components within normal limits  I-STAT ARTERIAL BLOOD GAS, ED - Abnormal; Notable for the following components:   pH, Arterial 7.251 (*)    pCO2 arterial 73.7 (*)    pO2, Arterial 114 (*)    Bicarbonate 32.6 (*)    TCO2 35 (*)    Acid-Base Excess 3.0 (*)    All other components within normal limits  TROPONIN I (HIGH SENSITIVITY) - Abnormal; Notable for the following components:   Troponin I (High Sensitivity) 23 (*)    All other components within normal limits  SARS CORONAVIRUS 2 BY RT PCR (HOSPITAL ORDER, [facility])  TSH  TROPONIN I (HIGH SENSITIVITY)    EKG EKG Interpretation  Date/Time:  Tuesday June 02 2020 11:20:55 EDT Ventricular Rate:  77 PR Interval:    QRS Duration: 168 QT Interval:  456 QTC Calculation: 517 R Axis:   -73 Text Interpretation: Sinus or ectopic atrial rhythm Ventricular premature complex Prolonged PR interval Nonspecific IVCD with LAD Consider anterior infarct No old tracing to compare Confirmed by Isla Pence [phone]) on 06/02/2020 12:02:33 PM   Radiology CT Head  Wo Contrast  Result Date: 06/02/2020 CLINICAL DATA:  86 year old male with history of posttraumatic headache after a fall. Three intra brazier in the right fronto parietal area. Pain in the back. EXAM: CT HEAD WITHOUT CONTRAST CT CERVICAL SPINE WITHOUT CONTRAST TECHNIQUE: Multidetector CT imaging of the head and cervical spine was performed following the standard protocol without intravenous contrast. Multiplanar CT image reconstructions of the cervical spine were also generated. COMPARISON:  Head CT 07/02/2019. FINDINGS: CT HEAD FINDINGS Brain: Moderate cerebral atrophy. Patchy and confluent areas of decreased attenuation are noted throughout the deep and periventricular white matter of the cerebral hemispheres bilaterally, compatible with chronic microvascular ischemic disease. No evidence of acute infarction, hemorrhage, hydrocephalus, extra-axial collection or mass lesion/mass effect. Vascular: No hyperdense vessel or unexpected calcification. Skull: Normal. Negative for fracture or focal lesion. Sinuses/Orbits: No acute finding. Other: None. CT CERVICAL SPINE FINDINGS Alignment: 3 mm of anterolisthesis of C3 upon C4, likely chronic and degenerative. Straightening of the normal cervical lordosis, likely chronic. Alignment is otherwise anatomic. Skull base and vertebrae: No acute fracture. No primary bone lesion or focal pathologic process. Extensive anterior osteophytosis at the level of C4-C6. Soft tissues and spinal canal: No prevertebral fluid or swelling. No visible canal hematoma. Disc levels: Multilevel degenerative disc disease, and multilevel facet arthropathy. Upper chest: Extensive aortic atherosclerosis. Status post median sternotomy. Other: None. IMPRESSION: 1. No evidence of significant acute traumatic injury to the skull, brain or cervical spine. 2. Moderate cerebral atrophy with extensive chronic microvascular ischemic changes in cerebral white matter, as above. 3. Multilevel degenerative disc  disease and cervical spondylosis, as above. Electronically Signed   By: Vinnie Langton M.D.   On: 06/02/2020 12:39   CT Cervical Spine Wo Contrast  Result Date: 06/02/2020 CLINICAL DATA:  86 year old male with history of posttraumatic headache after a fall. Three intra brazier in the right fronto parietal area. Pain in the back. EXAM: CT HEAD WITHOUT CONTRAST CT CERVICAL SPINE WITHOUT CONTRAST TECHNIQUE: Multidetector CT imaging of the head and cervical spine was performed following the standard protocol without intravenous contrast. Multiplanar CT image reconstructions of the cervical spine were also generated. COMPARISON:  Head CT 07/02/2019. FINDINGS: CT HEAD FINDINGS Brain: Moderate cerebral atrophy. Patchy and confluent areas of decreased attenuation are noted  throughout the deep and periventricular white matter of the cerebral hemispheres bilaterally, compatible with chronic microvascular ischemic disease. No evidence of acute infarction, hemorrhage, hydrocephalus, extra-axial collection or mass lesion/mass effect. Vascular: No hyperdense vessel or unexpected calcification. Skull: Normal. Negative for fracture or focal lesion. Sinuses/Orbits: No acute finding. Other: None. CT CERVICAL SPINE FINDINGS Alignment: 3 mm of anterolisthesis of C3 upon C4, likely chronic and degenerative. Straightening of the normal cervical lordosis, likely chronic. Alignment is otherwise anatomic. Skull base and vertebrae: No acute fracture. No primary bone lesion or focal pathologic process. Extensive anterior osteophytosis at the level of C4-C6. Soft tissues and spinal canal: No prevertebral fluid or swelling. No visible canal hematoma. Disc levels: Multilevel degenerative disc disease, and multilevel facet arthropathy. Upper chest: Extensive aortic atherosclerosis. Status post median sternotomy. Other: None. IMPRESSION: 1. No evidence of significant acute traumatic injury to the skull, brain or cervical spine. 2. Moderate  cerebral atrophy with extensive chronic microvascular ischemic changes in cerebral white matter, as above. 3. Multilevel degenerative disc disease and cervical spondylosis, as above. Electronically Signed   By: Vinnie Langton M.D.   On: 06/02/2020 12:39   DG Pelvis Portable  Result Date: 06/02/2020 CLINICAL DATA:  Fall with pelvic pain EXAM: PORTABLE PELVIS 1-2 VIEWS COMPARISON:  None. FINDINGS: Pelvic ring is intact. Degenerative changes of the hip joints and lumbar spine are seen. Diffuse vascular calcifications are noted. No soft tissue abnormality is noted. IMPRESSION: Chronic changes without acute abnormality Electronically Signed   By: Inez Catalina M.D.   On: 06/02/2020 11:54   DG Chest Portable 1 View  Result Date: 06/02/2020 CLINICAL DATA:  Fall EXAM: PORTABLE CHEST 1 VIEW COMPARISON:  None. FINDINGS: Prior CABG. Heart is borderline in size. Bibasilar atelectasis. No effusions or pneumothorax. No acute bony abnormality. IMPRESSION: Bibasilar atelectasis. Electronically Signed   By: Rolm Baptise M.D.   On: 06/02/2020 11:53    Procedures Procedures (including critical care time)  Medications Ordered in ED Medications  LORazepam (ATIVAN) injection 1 mg (1 mg Intravenous Given 06/02/20 1239)    ED Course  I have reviewed the triage vital signs and the nursing notes.  Pertinent labs & imaging results that were available during my care of the patient were reviewed by me and considered in my medical decision making (see chart for details).  Pt continued to be agitated while here.  He was given some ativan to try to calm down.  He did calm down, but his breathing worsened.  He became less responsive.  I was worried that his CO2 was elevated and it was.  Pt placed on bipap and is waking up more. Pt is a full code.  CT head/neck without acute injury.  C-collar removed.   Ambulatory dysfunction may be due to baclofen, but his wife thinks he only took 1 dose last night.  Pt d/w Dr. Tamala Julian  (triad) for admission.  CRITICAL CARE Performed by: Isla Pence   Total critical care time: 30 minutes  Critical care time was exclusive of separately billable procedures and treating other patients.  Critical care was necessary to treat or prevent imminent or life-threatening deterioration.  Critical care was time spent personally by me on the following activities: development of treatment plan with patient and/or surrogate as well as nursing, discussions with consultants, evaluation of patient's response to treatment, examination of patient, obtaining history from patient or surrogate, ordering and performing treatments and interventions, ordering and review of laboratory studies, ordering and review of radiographic studies,  pulse oximetry and re-evaluation of patient's condition.   MDM Rules/Calculators/A&P                      Final Clinical Impression(s) / ED Diagnoses Final diagnoses:  Ambulatory dysfunction  Injury of head, initial encounter  Acute respiratory failure with hypercapnia [facility])    Rx / DC Orders ED Discharge Orders    None       Isla Pence, MD 06/02/20 1401

## 2020-06-02 NOTE — ED Notes (Signed)
Pt oriented only to self. Pt stated that he was in his driveway when asked about locaztion

## 2020-06-02 NOTE — ED Notes (Signed)
Pt desats to 88% on RA, 2L applied, now 95%

## 2020-06-02 NOTE — Telephone Encounter (Signed)
FYI

## 2020-06-02 NOTE — Telephone Encounter (Signed)
Patient's wife called in to advise Dr.Parker that he had a fall this morning, ended up getting a bad cut on his forehead. States that his balance was really bad, but she called 911 to have him taken to the ED.

## 2020-06-02 NOTE — ED Notes (Signed)
RT at bedside, pt to be placed on bipap

## 2020-06-02 NOTE — ED Notes (Signed)
Pt is becoming more active and responsive, still cannot answer orientation questions appropriately but follows some commands when changing bed linens or asking about pain, etc.

## 2020-06-02 NOTE — ED Notes (Addendum)
Called wife for more information: Pt fell Thursday of last week at [facility] long ED, wife unsure if pt hit head.  Also fell Sunday, was not evaluated at hospital, unsure of head injury.  Seen yesterday by PCP, given new medication, baclofen 10mg .   Pt is full code per wife. "We have never been approached about that."   [phone] wife cell if not at home phone

## 2020-06-02 NOTE — H&P (Addendum)
History and Physical    Andrew [name] IEP:[id number] DOB: May 23, 1928 DOA: 06/02/2020  Referring MD/NP/PA: Isla Pence, MD PCP: Patient, No Pcp Per  Patient coming from: Home via EMS  Chief Complaint: Fall  I have personally briefly reviewed patient's old medical records in [place]   HPI: Andrew [name] is a 86 y.o. male with medical history significant of HTN, HLD, CHF, PAF on Eliquis, carotid artery stenosis, AAA, and CKD presents after having a fall at home. History is obtained from the patient's wife over the phone as the patient acutely altered and unable to give his own history at this time. Normally patient is not on oxygen at home. He had reportedly gotten up from his chair and became dizzy and fell hitting his head. His wife noticed that he had recently been started on baclofen and taking dose last night. He had fallen 2 days ago and was seen in the ED with 5 days ago for which she was started on antibiotics for cellulitis of the left leg.    ED Course: Upon admission into the emergency department patient was seen to be afebrile, respirations 17-30, blood pressures elevated 208/95, and O2 saturations as low as 88% on room air with improvement on 2 L nasal cannula oxygen.  ABG was obtained and revealed pH of 7.251, PCO2 73.7, and PO2 114.  Labs were significant for hemoglobin 12.9, platelets 136, BUN 42, creatinine 2.02, Trop 23 ->31 and AST 47.  Chest x-ray significant for bibasilar atelectasis.  Review of Systems  Unable to perform ROS: Mental status change  Musculoskeletal: Positive for falls.  Neurological: Positive for dizziness.   Past Medical History: Diagnosis Date . AAA (abdominal aortic aneurysm) ([place])  . Anemia  . Arthritis  . CAD (coronary artery disease)  . Cancer ([place])   Melanoma - Back . Carotid artery stenosis  . CHF (congestive heart failure) ([place])  . Cyst of kidney, acquired  . Depression  . Diabetes mellitus  . DVT (deep venous  thrombosis) ([place])  . Dysrhythmia  . Fatty liver 2008 . GERD (gastroesophageal reflux disease)  . Hyperlipidemia  . Hypertension  . IBS (irritable bowel syndrome)  . LVF (left ventricular failure) ([place])  . Paroxysmal atrial fibrillation (H[facility])  . Pneumonia Jan. 2014 . Shortness of breath dyspnea  . Thyroid disease  . Typical atrial flutter ([place])  . UTI (urinary tract infection) 05/2015  While in [place]  . Vitamin D deficiency    Past Surgical History: Procedure Laterality Date . ABDOMINAL AORTIC ANEURYSM REPAIR  2002 . CARDIAC CATHETERIZATION   . CORONARY ANGIOPLASTY   . CORONARY ANGIOPLASTY WITH STENT PLACEMENT  2005 . CORONARY ARTERY BYPASS GRAFT  1992 . EYE SURGERY    [place] . HERNIA REPAIR   . INGUINAL HERNIA REPAIR Bilateral   w/mesh . INSERTION OF MESH N/A 11/28/2017  Procedure: INSERTION OF MESH;  Surgeon: Donnie Mesa, MD;  Location: [facility];  Service: General;  Laterality: N/A;  GENERAL AND TAP BLOCK . KNEE SURGERY Bilateral  . LAPAROSCOPIC INGUINAL HERNIA WITH UMBILICAL HERNIA Bilateral 11/28/2017  Procedure: LAPAROSCOPIC BILATERAL INGUINAL HERNIA REPAIR WITH MESH, UMBILICAL HERNIA REPAIR WITH MESH;  Surgeon: Donnie Mesa, MD;  Location: [facility];  Service: General;  Laterality: Bilateral;  GENERAL AND TAP BLOCK . LEFT HEART CATHETERIZATION WITH CORONARY ANGIOGRAM N/A 10/30/2014  Procedure: LEFT HEART CATHETERIZATION WITH CORONARY ANGIOGRAM;  Surgeon: Larey Dresser, MD;  [facility] C[facility];  Service: Cardiovascular;  Laterality: N/A; . QUADRICEPS TENDON REPAIR  Bilateral 08/2003  Archie Endo 05/10/2011 . UMBILICAL HERNIA REPAIR  11/28/2017  w/mesh    reports that he has quit smoking. His smoking use included cigarettes. He has a 52.50 pack-year smoking history. He has never used smokeless tobacco. He reports current alcohol use of about 3.0 standard drinks of alcohol per week. He reports that he does not use  drugs.  Allergies Allergen Reactions . Metoprolol Shortness Of Breath, Palpitations and Other (See Comments)   Heart starts racing. Shallow breathing; pt states he also get skin irritation on his legs . Metformin Hives and Swelling   On legs . Metformin And Related Nausea And Vomiting   Family History Problem Relation Age of Onset . Colon cancer Father  . Coronary artery disease Brother  . Diabetes Brother  . Heart disease Brother  . Hyperlipidemia Brother  . Peripheral vascular disease Brother       Varicose Veins . Throat cancer Brother       abdominal cancer?  . Diabetes Son  . Hyperlipidemia Son     Prior to Admission medications   Not on File    Physical Exam:  Constitutional: elderly male is lethargic and no following commands Vitals:   06/02/20 1250 06/02/20 1256 06/02/20 1300 06/02/20 1315  BP: (!) 169/90  (!) 189/85 (!) 196/87  Pulse: 66 72 73 73  Resp: (!) 24 (!) 24 (!) 23 (!) 22  Temp:      TempSrc:      SpO2: (!) 83% 93% 94% 95%  Weight:      Height:       Eyes: PERRL, lids and conjunctivae normal ENMT: Mucous membranes are dry. Posterior pharynx clear of any exudate or lesions. Neck: normal, supple, no masses, no thyromegaly Respiratory: Tachypneic currently on BiPAP with O2 saturations maintained. Cardiovascular: Regular rate and rhythm, no murmurs / rubs / gallops. No extremity edema. 2+ pedal pulses. No carotid bruits.  Abdomen: no tenderness, no masses palpated. No hepatosplenomegaly. Bowel sounds positive.  Musculoskeletal: no clubbing / cyanosis. No joint deformity upper and lower extremities. Good ROM, no contractures. Normal muscle tone.  Skin: Venous stasis noted to bilateral lower extremity Neurologic: CN 2-12 grossly intact. Moves all extremities Psychiatric: Lethargic unable to assess orientation.    Labs on Admission: I have personally reviewed following labs and imaging studies  CBC: Recent Labs  Lab  06/02/20 1121 06/02/20 1312  WBC 6.9  --   NEUTROABS 4.5  --   HGB 12.9* 14.6  HCT 39.4 43.0  MCV 101.5*  --   PLT 136*  --    Basic Metabolic Panel: Recent Labs  Lab 06/02/20 1121 06/02/20 1312  NA 141 142  K 3.8 3.8  CL 103  --   CO2 25  --   GLUCOSE 149*  --   BUN 42*  --   CREATININE 2.02*  --   CALCIUM 9.2  --    GFR: Estimated Creatinine Clearance: 31.3 mL/min (A) (by C-G formula based on SCr of 2.02 mg/dL (H)). Liver Function Tests: Recent Labs  Lab 06/02/20 1121  AST 47*  ALT 30  ALKPHOS 65  BILITOT 1.0  PROT 6.9  ALBUMIN 3.6   No results for input(s): LIPASE, AMYLASE in the last 168 hours. No results for input(s): AMMONIA in the last 168 hours. Coagulation Profile: No results for input(s): INR, PROTIME in the last 168 hours. Cardiac Enzymes: No results for input(s): CKTOTAL, CKMB, CKMBINDEX, TROPONINI in the last 168 hours. BNP (last 3 results) No  results for input(s): PROBNP in the last 8760 hours. HbA1C: No results for input(s): HGBA1C in the last 72 hours. CBG: Recent Labs  Lab 06/02/20 1127  GLUCAP 150*   Lipid Profile: No results for input(s): CHOL, HDL, LDLCALC, TRIG, CHOLHDL, LDLDIRECT in the last 72 hours. Thyroid Function Tests: No results for input(s): TSH, T4TOTAL, FREET4, T3FREE, THYROIDAB in the last 72 hours. Anemia Panel: No results for input(s): VITAMINB12, FOLATE, FERRITIN, TIBC, IRON, RETICCTPCT in the last 72 hours. Urine analysis:    Component Value Date/Time   COLORURINE YELLOW 06/02/2020 1121   APPEARANCEUR CLEAR 06/02/2020 1121   LABSPEC 1.009 06/02/2020 1121   PHURINE 7.0 06/02/2020 1121   GLUCOSEU NEGATIVE 06/02/2020 1121   HGBUR SMALL (A) 06/02/2020 1121   BILIRUBINUR NEGATIVE 06/02/2020 1121   KETONESUR NEGATIVE 06/02/2020 1121   PROTEINUR 100 (A) 06/02/2020 1121   NITRITE NEGATIVE 06/02/2020 1121   LEUKOCYTESUR NEGATIVE 06/02/2020 1121   Sepsis Labs: No results found for this or any previous visit (from  the past 240 hour(s)).   Radiological Exams on Admission: CT Head Wo Contrast  Result Date: 06/02/2020 CLINICAL DATA:  86 year old male with history of posttraumatic headache after a fall. Three intra brazier in the right fronto parietal area. Pain in the back. EXAM: CT HEAD WITHOUT CONTRAST CT CERVICAL SPINE WITHOUT CONTRAST TECHNIQUE: Multidetector CT imaging of the head and cervical spine was performed following the standard protocol without intravenous contrast. Multiplanar CT image reconstructions of the cervical spine were also generated. COMPARISON:  Head CT 07/02/2019. FINDINGS: CT HEAD FINDINGS Brain: Moderate cerebral atrophy. Patchy and confluent areas of decreased attenuation are noted throughout the deep and periventricular white matter of the cerebral hemispheres bilaterally, compatible with chronic microvascular ischemic disease. No evidence of acute infarction, hemorrhage, hydrocephalus, extra-axial collection or mass lesion/mass effect. Vascular: No hyperdense vessel or unexpected calcification. Skull: Normal. Negative for fracture or focal lesion. Sinuses/Orbits: No acute finding. Other: None. CT CERVICAL SPINE FINDINGS Alignment: 3 mm of anterolisthesis of C3 upon C4, likely chronic and degenerative. Straightening of the normal cervical lordosis, likely chronic. Alignment is otherwise anatomic. Skull base and vertebrae: No acute fracture. No primary bone lesion or focal pathologic process. Extensive anterior osteophytosis at the level of C4-C6. Soft tissues and spinal canal: No prevertebral fluid or swelling. No visible canal hematoma. Disc levels: Multilevel degenerative disc disease, and multilevel facet arthropathy. Upper chest: Extensive aortic atherosclerosis. Status post median sternotomy. Other: None. IMPRESSION: 1. No evidence of significant acute traumatic injury to the skull, brain or cervical spine. 2. Moderate cerebral atrophy with extensive chronic microvascular ischemic changes  in cerebral white matter, as above. 3. Multilevel degenerative disc disease and cervical spondylosis, as above. Electronically Signed   By: Vinnie Langton M.D.   On: 06/02/2020 12:39   CT Cervical Spine Wo Contrast  Result Date: 06/02/2020 CLINICAL DATA:  86 year old male with history of posttraumatic headache after a fall. Three intra brazier in the right fronto parietal area. Pain in the back. EXAM: CT HEAD WITHOUT CONTRAST CT CERVICAL SPINE WITHOUT CONTRAST TECHNIQUE: Multidetector CT imaging of the head and cervical spine was performed following the standard protocol without intravenous contrast. Multiplanar CT image reconstructions of the cervical spine were also generated. COMPARISON:  Head CT 07/02/2019. FINDINGS: CT HEAD FINDINGS Brain: Moderate cerebral atrophy. Patchy and confluent areas of decreased attenuation are noted throughout the deep and periventricular white matter of the cerebral hemispheres bilaterally, compatible with chronic microvascular ischemic disease. No evidence of acute infarction, hemorrhage, hydrocephalus,  extra-axial collection or mass lesion/mass effect. Vascular: No hyperdense vessel or unexpected calcification. Skull: Normal. Negative for fracture or focal lesion. Sinuses/Orbits: No acute finding. Other: None. CT CERVICAL SPINE FINDINGS Alignment: 3 mm of anterolisthesis of C3 upon C4, likely chronic and degenerative. Straightening of the normal cervical lordosis, likely chronic. Alignment is otherwise anatomic. Skull base and vertebrae: No acute fracture. No primary bone lesion or focal pathologic process. Extensive anterior osteophytosis at the level of C4-C6. Soft tissues and spinal canal: No prevertebral fluid or swelling. No visible canal hematoma. Disc levels: Multilevel degenerative disc disease, and multilevel facet arthropathy. Upper chest: Extensive aortic atherosclerosis. Status post median sternotomy. Other: None. IMPRESSION: 1. No evidence of significant acute  traumatic injury to the skull, brain or cervical spine. 2. Moderate cerebral atrophy with extensive chronic microvascular ischemic changes in cerebral white matter, as above. 3. Multilevel degenerative disc disease and cervical spondylosis, as above. Electronically Signed   By: Vinnie Langton M.D.   On: 06/02/2020 12:39   DG Pelvis Portable  Result Date: 06/02/2020 CLINICAL DATA:  Fall with pelvic pain EXAM: PORTABLE PELVIS 1-2 VIEWS COMPARISON:  None. FINDINGS: Pelvic ring is intact. Degenerative changes of the hip joints and lumbar spine are seen. Diffuse vascular calcifications are noted. No soft tissue abnormality is noted. IMPRESSION: Chronic changes without acute abnormality Electronically Signed   By: Inez Catalina M.D.   On: 06/02/2020 11:54   DG Chest Portable 1 View  Result Date: 06/02/2020 CLINICAL DATA:  Fall EXAM: PORTABLE CHEST 1 VIEW COMPARISON:  None. FINDINGS: Prior CABG. Heart is borderline in size. Bibasilar atelectasis. No effusions or pneumothorax. No acute bony abnormality. IMPRESSION: Bibasilar atelectasis. Electronically Signed   By: Rolm Baptise M.D.   On: 06/02/2020 11:53    EKG: Independently reviewed. 77 bpm  Assessment/Plan Respiratory failure with hypercapnia, COPD:  Acute on chronic:Patient noted to be altered and less responsive. ABG significant for pH 7.2551, PCO2 73.7, PO2 114. Patient had been placed on BiPAP. -Admit to a progressive bed -Continuous pulse oximetry  -Continue BiPAP  -N.p.o. while home BiPAP -Albuterol nebs as need  Acute metabolic encephalopathy: Patient noted to be more confused than baseline. Suspect symptoms could be secondary to hypercapnia. Other factors include baclofen for the first time last night.  -Neurochecks -Hold baclofen -Avoid any sedating medication  Falls: Wife notes having multiple falls at home. -Will need consults to PT/OT   Cellulitis: Patient had been being treat with cephalexin. Looks like venous statis. -Continue  to monitor for need of continued antibiotics   CKD stage IV: Cr 2.02 which appears near his baseline. -Continue to monitor  PAF on chronic anticoagulation: Appears to be in sinus rhythm. -Heparin per pharmacy while [place] when able  Systolic QBV:[id number]. Last EF 45-50% in 07/2019. Patient does not appear fluid overloaded at this time. -Strict intake and output -Daily weights  Hypothyroidism:TSH 1.138 -Continue po Levothyroxine when able  DVT prophylaxis: heparin Code Status: Full Family Communication: Wife updated over the phone Disposition Plan: To be determined Consults called: None Admission status: Inpatient  Norval Morton MD [facility] Pager [phone]   If 7PM-7AM, please contact night-coverage [email or web address] Password [email or web address]  06/02/2020, 1:51 PM

## 2020-06-02 NOTE — Telephone Encounter (Signed)
Agree with him going to ED.  Algis Greenhouse. Jerline Pain, MD 06/02/2020 1:56 PM

## 2020-06-02 NOTE — Telephone Encounter (Signed)
Patient's wife is wanting Dr.Parker to know the update for her husband, he was admitted for CO2 Retention.

## 2020-06-02 NOTE — Telephone Encounter (Signed)
Please advise 

## 2020-06-02 NOTE — ED Notes (Signed)
Pt now becoming active, not responding appropriately to questions, reaches for bipap and attempts to remove along with IVs, soft restraints applied per orders from EDP. Plan to progress off as pt returns to baseline A&Ox4 and follows commands, on BiPAP to address hypoxia contributing to condition.

## 2020-06-02 NOTE — ED Notes (Signed)
Pt becomes increasingly agitated and confused, seen holding head and crying out "please stop". Ativan 1 mg given (see MAR).  Noted pt breath sounds worse, now audible crackles, abd breathing, requiring NRB to maintain spO2 92%. EDP reassesses at this time, new orders placed

## 2020-06-03 LAB — BASIC METABOLIC PANEL
Anion gap: 15 (ref 5–15)
BUN: 36 mg/dL — ABNORMAL HIGH (ref 8–23)
CO2: 25 mmol/L (ref 22–32)
Calcium: 9.4 mg/dL (ref 8.9–10.3)
Chloride: 104 mmol/L (ref 98–111)
Creatinine, Ser: 1.71 mg/dL — ABNORMAL HIGH (ref 0.61–1.24)
GFR calc Af Amer: 40 mL/min — ABNORMAL LOW (ref >60)
GFR calc non Af Amer: 35 mL/min — ABNORMAL LOW (ref >60)
Glucose, Bld: 162 mg/dL — ABNORMAL HIGH (ref 70–99)
Potassium: 3.7 mmol/L (ref 3.5–5.1)
Sodium: 144 mmol/L (ref 135–145)

## 2020-06-03 LAB — BLOOD GAS, ARTERIAL
Acid-Base Excess: 1.9 mmol/L (ref 0.0–2.0)
Bicarbonate: 26.8 mmol/L (ref 20.0–28.0)
Drawn by: 36529
FIO2: 60
O2 Saturation: 90.8 %
Patient temperature: 37
pCO2 arterial: 48.4 mmHg — ABNORMAL HIGH (ref 32.0–48.0)
pH, Arterial: 7.362 (ref 7.350–7.450)
pO2, Arterial: 65.1 mmHg — ABNORMAL LOW (ref 83.0–108.0)

## 2020-06-03 LAB — CBC
HCT: 45.5 % (ref 39.0–52.0)
Hemoglobin: 14.9 g/dL (ref 13.0–17.0)
MCH: 32.8 pg (ref 26.0–34.0)
MCHC: 32.7 g/dL (ref 30.0–36.0)
MCV: 100.2 fL — ABNORMAL HIGH (ref 80.0–100.0)
Platelets: 181 10*3/uL (ref 150–400)
RBC: 4.54 MIL/uL (ref 4.22–5.81)
RDW: 12.8 % (ref 11.5–15.5)
WBC: 12.4 10*3/uL — ABNORMAL HIGH (ref 4.0–10.5)
nRBC: 0 % (ref 0.0–0.2)

## 2020-06-03 LAB — HEPARIN LEVEL (UNFRACTIONATED): Heparin Unfractionated: 1.72 IU/mL — ABNORMAL HIGH (ref 0.30–0.70)

## 2020-06-03 LAB — APTT: aPTT: 101 seconds — ABNORMAL HIGH (ref 24–36)

## 2020-06-03 LAB — BRAIN NATRIURETIC PEPTIDE: B Natriuretic Peptide: 1981.3 pg/mL — ABNORMAL HIGH (ref 0.0–100.0)

## 2020-06-03 MED ORDER — IPRATROPIUM-ALBUTEROL 0.5-2.5 (3) MG/3ML IN SOLN
3.0000 mL | Freq: Four times a day (QID) | RESPIRATORY_TRACT | Status: DC
  Administered 2020-06-03 – 2020-06-04 (×5): 3 mL via RESPIRATORY_TRACT
  Filled 2020-06-03 (×4): qty 3

## 2020-06-03 MED ORDER — FUROSEMIDE 10 MG/ML IJ SOLN
40.0000 mg | Freq: Two times a day (BID) | INTRAMUSCULAR | Status: DC
  Administered 2020-06-03 – 2020-06-05 (×5): 40 mg via INTRAVENOUS
  Filled 2020-06-03 (×5): qty 4

## 2020-06-03 MED ORDER — AMLODIPINE BESYLATE 10 MG PO TABS: 10.0000 mg | ORAL_TABLET | Freq: Every day | ORAL | Status: DC

## 2020-06-03 MED ORDER — HALOPERIDOL LACTATE 5 MG/ML IJ SOLN
2.0000 mg | Freq: Once | INTRAMUSCULAR | Status: AC
  Administered 2020-06-03: 2 mg via INTRAVENOUS
  Filled 2020-06-03: qty 1

## 2020-06-03 MED ORDER — LEVOTHYROXINE SODIUM 100 MCG/5ML IV SOLN
50.0000 ug | Freq: Every day | INTRAVENOUS | Status: DC
  Administered 2020-06-03 – 2020-06-06 (×4): 50 ug via INTRAVENOUS
  Filled 2020-06-03 (×4): qty 5

## 2020-06-03 MED ORDER — METOPROLOL TARTRATE 5 MG/5ML IV SOLN
5.0000 mg | Freq: Three times a day (TID) | INTRAVENOUS | Status: DC
  Administered 2020-06-03 – 2020-06-06 (×8): 5 mg via INTRAVENOUS
  Filled 2020-06-03 (×8): qty 5

## 2020-06-03 NOTE — TOC Initial Note (Signed)
Transition of Care [facility]) - Initial/Assessment Note    Patient Details  Name: Andrew [name] MRN: [id number] Date of Birth: August 05, 1928  Transition of Care [facility]) CM/SW Contact:    Carles Collet, RN Phone Number: 06/03/2020, 11:35 AM  Clinical Narrative:   Damaris Schooner w wife over the phone. [phone]. Patient is from home w wife, has used cane for the past few days prior to admission.Otherwise, he did not need any supportive devices for ambulation. He did not require any oxygen, or any other breathing device at home. He has not  Driven in 5 years. He has only started acting confused since he has been on baclofen per wife's report.  Currently on BiPAP. She expressed needing home health services, discussed medicare ratings, [place] able to accept referral for PT OT HHA.  TOC will continue to follow.                Expected Discharge Plan: [place] Barriers to Discharge: Continued Medical Work up   Patient Goals and CMS Choice        Expected Discharge Plan and Services Expected Discharge Plan: [place]                                              Prior Living Arrangements/Services   Lives with:: Spouse                   Activities of Daily Living      Permission Sought/Granted                  Emotional Assessment              Admission diagnosis:  Acute respiratory failure with hypercapnia (HCC) [J96.02] Injury of head, initial encounter [S09.90XA] Ambulatory dysfunction [R26.2] Patient Active Problem List   Diagnosis Date Noted  . Acute respiratory failure with hypercapnia ([place]) 06/02/2020  . Acute metabolic encephalopathy 27/74/1287  . CKD (chronic kidney disease), stage IV ([place]) 06/02/2020  . PAF (paroxysmal atrial fibrillation) ([place]) 06/02/2020  . Hypothyroidism 06/02/2020  . Chronic anticoagulation 06/02/2020   PCP:  Patient, No Pcp Per Pharmacy:  No Pharmacies Listed    Social  Determinants of Health (SDOH) Interventions    Readmission Risk Interventions No flowsheet data found.

## 2020-06-03 NOTE — Progress Notes (Signed)
ANTICOAGULATION CONSULT NOTE - Follow Up Consult  Pharmacy Consult for Heparin Indication: atrial fibrillation  No Known Allergies  Patient Measurements: Height: 5\' 8"  (172.7 cm) Weight: 81.8 kg (180 lb 5.4 oz) IBW/kg (Calculated) : 68.4 Heparin Dosing Weight:  84.8 kg  Vital Signs: Temp: 98.3 F (36.8 C) (06/09 0800) Temp Source: Axillary (06/09 0800) BP: 168/87 (06/09 0800) Pulse Rate: 67 (06/09 0800)  Labs: Recent Labs    06/02/20 1121 06/02/20 1121 06/02/20 1312 06/02/20 1312 06/02/20 1324 06/02/20 1556 06/03/20 0604  HGB 12.9*   < > 14.6   < >  --  14.3 14.9  HCT 39.4   < > 43.0  --   --  42.0 45.5  PLT 136*  --   --   --   --   --  181  APTT  --   --   --   --   --   --  101*  HEPARINUNFRC  --   --   --   --   --   --  1.72*  CREATININE 2.02*  --   --   --   --   --  1.71*  TROPONINIHS 23*  --   --   --  31*  --   --    < > = values in this interval not displayed.    Estimated Creatinine Clearance: 28.3 mL/min (A) (by C-G formula based on SCr of 1.71 mg/dL (H)).  Assessment: Anticoag: Heparin for afib while NPO (PTA apixaban - LD 6/7 PM) - Hgb 14.9, plts 181 up. CT head neg for bleed. HL 1.72 from Eliquis. APTT 101 in goal. CHADS2VASC 6 (no DM)  Goal of Therapy:  aPTT 66-102 seconds Monitor platelets by anticoagulation protocol: Yes   Plan:  Heparin gtt 1200 units/hr Daily HL, aPTT and CBC F/u ability to resume Eliquis   Shanavia Makela S. Alford Highland, PharmD, BCPS Clinical Staff Pharmacist [email or web address] Alford Highland, Esme Durkin Stillinger 06/03/2020,8:46 AM

## 2020-06-03 NOTE — Progress Notes (Signed)
Pt continued to be restless.  He has managed a few times to grab items of the BiPAP and take them off while in restraints and wearing safety mittens.  On-call provider was paged; order for one-time 2mg  Haldol administered.

## 2020-06-03 NOTE — Progress Notes (Signed)
Pt arrived to unit awake, oriented to himself and restless.  On BiPAP and bilateral soft wrist restraints.  Pt on telemetry.  Two small abrasions on his buttocks (foam replaced).  New condom catheter placed.  Pt is consoled by voice and reassurance, but quickly becomes restless afterward.  PIV in the right and left arms.  Heparin running 12/hr.   Bed in lowest position and floor mats in place.   Pt was able to pull the tubing off of his BiPAP mask.  He was not without the oxygen for very long.  Tubing replaced.  Patient is now wearing safety mitts.

## 2020-06-03 NOTE — Progress Notes (Signed)
[facility]  PROGRESS NOTE  Andrew [name] HTX:[id number] DOB: [birth year] DOA: 06/02/2020 PCP: Patient, No Pcp Per   Brief HPI:   86 year old male with a history of hypertension, hyperlipidemia, CHF, paroxysmal atrial fibrillation on Eliquis, carotid stenosis, AAA presented to the ED after a fall at home.  Apparently patient got from his chair felt dizzy and fell hitting his head.  Patient was found to be in acute respiratory failure with hypercapnia, started on BiPAP.  ABG shows PCO2 of 73.7.   Subjective   Patient seen and examined, currently on BiPAP.  He is more alert, answering questions.   Assessment/Plan:     1. Acute hypercapnic respiratory failure-secondary to COPD patient's ABG this morning showed PCO2 of 48.4.  Continue BiPAP, will try to wean him off BiPAP.  Start DuoNeb scheduled every 6 hours. 2. Acute on chronic systolic CHF-patient takes Lasix at home.  Will start Lasix 40 mg IV every 12 hours. 3. Uncontrolled hypertension-Home medications have been restarted, since patient is on BiPAP, oral medications have been held.  Will discontinue Coreg at this time and start metoprolol 5 mg IV every 8 hours scheduled.  Continue as needed hydralazine 10 mg IV every 4 hours as needed.   4. Paroxysmal atrial fibrillation on chronic anticoagulation-patient is in normal sinus rhythm, patient started on heparin per pharmacy.  He takes Eliquis at home.  Will restart Eliquis once he is off BiPAP.  Continue amiodarone 100 mg p.o. daily when able. 5. CKD stage IV-baseline creatinine around 2.  Today creatinine is 1.71.  We will continue to monitor patient's creatinine in the hospital. 6. ?  Cellulitis-patient was treated with cephalexin for lower extremity cellulitis, no signs and symptoms of infection.  Looks more likely venous stasis.  Continue to monitor off antibiotics. 7. Hypothyroidism- continue synthroid.  We will switch Synthroid to IV formulation.     SpO2: 90 % O2 Flow Rate  (L/min): 4 L/min FiO2 (%): 75 %   COVID-19 Labs  No results for input(s): DDIMER, FERRITIN, LDH, CRP in the last 72 hours.  Lab Results  Component Value Date   [place] NEGATIVE 06/02/2020     CBG: Recent Labs  Lab 06/02/20 1127  GLUCAP 150*    CBC: Recent Labs  Lab 06/02/20 1121 06/02/20 1312 06/02/20 1556 06/03/20 0604  WBC 6.9  --   --  12.4*  NEUTROABS 4.5  --   --   --   HGB 12.9* 14.6 14.3 14.9  HCT 39.4 43.0 42.0 45.5  MCV 101.5*  --   --  100.2*  PLT 136*  --   --  320    Basic Metabolic Panel: Recent Labs  Lab 06/02/20 1121 06/02/20 1312 06/02/20 1556 06/03/20 0604  NA 141 142 144 144  K 3.8 3.8 3.5 3.7  CL 103  --   --  104  CO2 25  --   --  25  GLUCOSE 149*  --   --  162*  BUN 42*  --   --  36*  CREATININE 2.02*  --   --  1.71*  CALCIUM 9.2  --   --  9.4     Liver Function Tests: Recent Labs  Lab 06/02/20 1121  AST 47*  ALT 30  ALKPHOS 65  BILITOT 1.0  PROT 6.9  ALBUMIN 3.6        DVT prophylaxis: Heparin  Code Status: Full code  Family Communication: No family at bedside    Status is:  Inpatient  Dispo: The patient is from: Home              Anticipated d/c is to: Home              Anticipated d/c date is: 06/06/2020              Patient currently not medically stable  Barrier to discharge-patient is acute respiratory failure with hypercapnia, currently on BiPAP        Scheduled medications:  . amiodarone  100 mg Oral Daily  . atorvastatin  80 mg Oral Daily  . carvedilol  12.5 mg Oral BID  . cilostazol  100 mg Oral BID  . ezetimibe  10 mg Oral Daily  . FLUoxetine  20 mg Oral Daily  . furosemide  40 mg Intravenous Q12H  . hydrALAZINE  50 mg Oral TID  . ipratropium-albuterol  3 mL Nebulization Q6H  . isosorbide mononitrate  30 mg Oral Daily  . levothyroxine  100 mcg Oral Q0600  . sodium chloride flush  3 mL Intravenous Q12H    Consultants:    Procedures:    Antibiotics:   Anti-infectives  (From admission, onward)   None       Objective   Vitals:   06/03/20 1200 06/03/20 1400 06/03/20 1456 06/03/20 1500  BP: (!) 170/91 (!) 174/75    Pulse: 78 80 97 92  Resp: (!) 24 (!) 25 (!) 31 (!) 25  Temp: 98.2 F (36.8 C) 99.6 F (37.6 C)    TempSrc: Axillary Axillary    SpO2: 90% 90% 91% 90%  Weight:      Height:        Intake/Output Summary (Last 24 hours) at 06/03/2020 1634 Last data filed at 06/03/2020 1158 Gross per 24 hour  Intake 97.89 ml  Output 350 ml  Net -252.11 ml    06/07 1901 - 06/09 0700 In: 75.7 [I.V.:75.7] Out: 0   Filed Weights   06/02/20 1136 06/02/20 2100 06/03/20 0125  Weight: 113.4 kg 84.8 kg 81.8 kg    Physical Examination:    General: Appears in no acute distress  Cardiovascular: S1-S2, regular, no murmur auscultated  Respiratory: Decreased breath sounds bilaterally  Abdomen: Abdomen is soft, nontender, no organomegaly  Extremities: No edema in the lower extremities  Neurologic: Lethargic but responding to questions, on BiPAP, no focal deficit noted    Data Reviewed:   Recent Results (from the past 240 hour(s))  SARS Coronavirus 2 by RT PCR (hospital order, performed in [facility] hospital lab) Nasopharyngeal Nasopharyngeal Swab     Status: None   Collection Time: 06/02/20 12:56 PM   Specimen: Nasopharyngeal Swab  Result Value Ref Range Status   SARS Coronavirus 2 NEGATIVE NEGATIVE Final    Comment: (NOTE) SARS-CoV-2 target nucleic acids are NOT DETECTED. The SARS-CoV-2 RNA is generally detectable in upper and lower respiratory specimens during the acute phase of infection. The lowest concentration of SARS-CoV-2 viral copies this assay can detect is 250 copies / mL. A negative result does not preclude SARS-CoV-2 infection and should not be used as the sole basis for treatment or other patient management decisions.  A negative result may occur with improper specimen collection / handling, submission of specimen other than  nasopharyngeal swab, presence of viral mutation(s) within the areas targeted by this assay, and inadequate number of viral copies (<250 copies / mL). A negative result must be combined with clinical observations, patient history, and epidemiological information. Fact Sheet for Patients:  StrictlyIdeas.no Fact Sheet for Healthcare Providers: BankingDealers.co.za This test is not yet approved or cleared  by the [place] FDA and has been authorized for detection and/or diagnosis of SARS-CoV-2 by FDA under an Emergency Use Authorization (EUA).  This EUA will remain in effect (meaning this test can be used) for the duration of the COVID-19 declaration under Section 564(b)(1) of the Act, 21 U.S.C. section 360bbb-3(b)(1), unless the authorization is terminated or revoked sooner. Performed at [facility], [place] [address] [place], [place] [address]   Culture, blood (routine x 2)     Status: None (Preliminary result)   Collection Time: 06/02/20 10:04 PM   Specimen: BLOOD LEFT WRIST  Result Value Ref Range Status   Specimen Description BLOOD LEFT WRIST  Final   Special Requests   Final    BOTTLES DRAWN AEROBIC AND ANAEROBIC Blood Culture results may not be optimal due to an inadequate volume of blood received in culture bottles   Culture   Final    NO GROWTH < 12 HOURS Performed at [facility], [place] [address] [place], [place] [address]    Report Status PENDING  Incomplete  Culture, blood (routine x 2)     Status: None (Preliminary result)   Collection Time: 06/02/20 10:04 PM   Specimen: BLOOD  Result Value Ref Range Status   Specimen Description BLOOD  Final   Special Requests   Final    LEFT KNUCKLE BOTTLES DRAWN AEROBIC AND ANAEROBIC Blood Culture results may not be optimal due to an inadequate volume of blood received in culture bottles   Culture   Final    NO GROWTH < 12 HOURS Performed at [facility], [place] [address] [place], [place] [address]    Report Status PENDING  Incomplete    No results for input(s): LIPASE, AMYLASE in the last 168 hours. No results for input(s): AMMONIA in the last 168 hours.  Cardiac Enzymes: No results for input(s): CKTOTAL, CKMB, CKMBINDEX, TROPONINI in the last 168 hours. BNP (last 3 results) Recent Labs    06/03/20 0604  BNP 1,981.3*    ProBNP (last 3 results) No results for input(s): PROBNP in the last 8760 hours.  Studies:  CT Head Wo Contrast  Result Date: 06/02/2020 CLINICAL DATA:  86 year old male with history of posttraumatic headache after a fall. Three intra brazier in the right fronto parietal area. Pain in the back. EXAM: CT HEAD WITHOUT CONTRAST CT CERVICAL SPINE WITHOUT CONTRAST TECHNIQUE: Multidetector CT imaging of the head and cervical spine was performed following the standard protocol without intravenous contrast. Multiplanar CT image reconstructions of the cervical spine were also generated. COMPARISON:  Head CT 07/02/2019. FINDINGS: CT HEAD FINDINGS Brain: Moderate cerebral atrophy. Patchy and confluent areas of decreased attenuation are noted throughout the deep and periventricular white matter of the cerebral hemispheres bilaterally, compatible with chronic microvascular ischemic disease. No evidence of acute infarction, hemorrhage, hydrocephalus, extra-axial collection or mass lesion/mass effect. Vascular: No hyperdense vessel or unexpected calcification. Skull: Normal. Negative for fracture or focal lesion. Sinuses/Orbits: No acute finding. Other: None. CT CERVICAL SPINE FINDINGS Alignment: 3 mm of anterolisthesis of C3 upon C4, likely chronic and degenerative. Straightening of the normal cervical lordosis, likely chronic. Alignment is otherwise anatomic. Skull base and vertebrae: No acute fracture. No primary bone lesion or focal pathologic process. Extensive anterior osteophytosis at the level of C4-C6. Soft tissues and spinal canal: No  prevertebral fluid or swelling. No visible canal hematoma. Disc  levels: Multilevel degenerative disc disease, and multilevel facet arthropathy. Upper chest: Extensive aortic atherosclerosis. Status post median sternotomy. Other: None. IMPRESSION: 1. No evidence of significant acute traumatic injury to the skull, brain or cervical spine. 2. Moderate cerebral atrophy with extensive chronic microvascular ischemic changes in cerebral white matter, as above. 3. Multilevel degenerative disc disease and cervical spondylosis, as above. Electronically Signed   By: Vinnie Langton M.D.   On: 06/02/2020 12:39   CT Cervical Spine Wo Contrast  Result Date: 06/02/2020 CLINICAL DATA:  86 year old male with history of posttraumatic headache after a fall. Three intra brazier in the right fronto parietal area. Pain in the back. EXAM: CT HEAD WITHOUT CONTRAST CT CERVICAL SPINE WITHOUT CONTRAST TECHNIQUE: Multidetector CT imaging of the head and cervical spine was performed following the standard protocol without intravenous contrast. Multiplanar CT image reconstructions of the cervical spine were also generated. COMPARISON:  Head CT 07/02/2019. FINDINGS: CT HEAD FINDINGS Brain: Moderate cerebral atrophy. Patchy and confluent areas of decreased attenuation are noted throughout the deep and periventricular white matter of the cerebral hemispheres bilaterally, compatible with chronic microvascular ischemic disease. No evidence of acute infarction, hemorrhage, hydrocephalus, extra-axial collection or mass lesion/mass effect. Vascular: No hyperdense vessel or unexpected calcification. Skull: Normal. Negative for fracture or focal lesion. Sinuses/Orbits: No acute finding. Other: None. CT CERVICAL SPINE FINDINGS Alignment: 3 mm of anterolisthesis of C3 upon C4, likely chronic and degenerative. Straightening of the normal cervical lordosis, likely chronic. Alignment is otherwise anatomic. Skull base and vertebrae: No acute fracture. No  primary bone lesion or focal pathologic process. Extensive anterior osteophytosis at the level of C4-C6. Soft tissues and spinal canal: No prevertebral fluid or swelling. No visible canal hematoma. Disc levels: Multilevel degenerative disc disease, and multilevel facet arthropathy. Upper chest: Extensive aortic atherosclerosis. Status post median sternotomy. Other: None. IMPRESSION: 1. No evidence of significant acute traumatic injury to the skull, brain or cervical spine. 2. Moderate cerebral atrophy with extensive chronic microvascular ischemic changes in cerebral white matter, as above. 3. Multilevel degenerative disc disease and cervical spondylosis, as above. Electronically Signed   By: Vinnie Langton M.D.   On: 06/02/2020 12:39   DG Pelvis Portable  Result Date: 06/02/2020 CLINICAL DATA:  Fall with pelvic pain EXAM: PORTABLE PELVIS 1-2 VIEWS COMPARISON:  None. FINDINGS: Pelvic ring is intact. Degenerative changes of the hip joints and lumbar spine are seen. Diffuse vascular calcifications are noted. No soft tissue abnormality is noted. IMPRESSION: Chronic changes without acute abnormality Electronically Signed   By: Inez Catalina M.D.   On: 06/02/2020 11:54   DG Chest Portable 1 View  Result Date: 06/02/2020 CLINICAL DATA:  Fall EXAM: PORTABLE CHEST 1 VIEW COMPARISON:  None. FINDINGS: Prior CABG. Heart is borderline in size. Bibasilar atelectasis. No effusions or pneumothorax. No acute bony abnormality. IMPRESSION: Bibasilar atelectasis. Electronically Signed   By: Rolm Baptise M.D.   On: 06/02/2020 11:53       [facility] If 7PM-7AM, please contact night-coverage at [email or web address], Office  [phone]   06/03/2020, 4:34 PM  LOS: 1 day

## 2020-06-04 ENCOUNTER — Inpatient Hospital Stay (HOSPITAL_COMMUNITY): Payer: Medicare PPO

## 2020-06-04 LAB — BASIC METABOLIC PANEL
Anion gap: 15 (ref 5–15)
BUN: 36 mg/dL — ABNORMAL HIGH (ref 8–23)
CO2: 26 mmol/L (ref 22–32)
Calcium: 9.2 mg/dL (ref 8.9–10.3)
Chloride: 107 mmol/L (ref 98–111)
Creatinine, Ser: 1.64 mg/dL — ABNORMAL HIGH (ref 0.61–1.24)
GFR calc Af Amer: 42 mL/min — ABNORMAL LOW (ref >60)
GFR calc non Af Amer: 37 mL/min — ABNORMAL LOW (ref >60)
Glucose, Bld: 146 mg/dL — ABNORMAL HIGH (ref 70–99)
Potassium: 3.1 mmol/L — ABNORMAL LOW (ref 3.5–5.1)
Sodium: 148 mmol/L — ABNORMAL HIGH (ref 135–145)

## 2020-06-04 LAB — CBC
HCT: 46.7 % (ref 39.0–52.0)
Hemoglobin: 15.2 g/dL (ref 13.0–17.0)
MCH: 33.1 pg (ref 26.0–34.0)
MCHC: 32.5 g/dL (ref 30.0–36.0)
MCV: 101.7 fL — ABNORMAL HIGH (ref 80.0–100.0)
Platelets: 172 10*3/uL (ref 150–400)
RBC: 4.59 MIL/uL (ref 4.22–5.81)
RDW: 13.2 % (ref 11.5–15.5)
WBC: 14.2 10*3/uL — ABNORMAL HIGH (ref 4.0–10.5)
nRBC: 0 % (ref 0.0–0.2)

## 2020-06-04 LAB — APTT: aPTT: 135 seconds — ABNORMAL HIGH (ref 24–36)

## 2020-06-04 LAB — HEPARIN LEVEL (UNFRACTIONATED): Heparin Unfractionated: 1.54 IU/mL — ABNORMAL HIGH (ref 0.30–0.70)

## 2020-06-04 LAB — MRSA PCR SCREENING: MRSA by PCR: NEGATIVE

## 2020-06-04 MED ORDER — POTASSIUM CHLORIDE CRYS ER 20 MEQ PO TBCR
40.0000 meq | EXTENDED_RELEASE_TABLET | Freq: Once | ORAL | Status: AC
  Administered 2020-06-04: 40 meq via ORAL
  Filled 2020-06-04: qty 2

## 2020-06-04 MED ORDER — APIXABAN 2.5 MG PO TABS
2.5000 mg | ORAL_TABLET | Freq: Two times a day (BID) | ORAL | Status: DC
  Administered 2020-06-04 – 2020-06-08 (×9): 2.5 mg via ORAL
  Filled 2020-06-04 (×9): qty 1

## 2020-06-04 MED ORDER — CHLORHEXIDINE GLUCONATE 0.12 % MT SOLN
15.0000 mL | Freq: Two times a day (BID) | OROMUCOSAL | Status: DC
  Administered 2020-06-04 – 2020-06-12 (×17): 15 mL via OROMUCOSAL
  Filled 2020-06-04 (×17): qty 15

## 2020-06-04 MED ORDER — ALBUMIN HUMAN 5 % IV SOLN
12.5000 g | Freq: Once | INTRAVENOUS | Status: AC
  Administered 2020-06-04: 12.5 g via INTRAVENOUS
  Filled 2020-06-04: qty 250

## 2020-06-04 MED ORDER — ORAL CARE MOUTH RINSE
15.0000 mL | Freq: Two times a day (BID) | OROMUCOSAL | Status: DC
  Administered 2020-06-04 – 2020-06-11 (×13): 15 mL via OROMUCOSAL

## 2020-06-04 MED ORDER — IPRATROPIUM-ALBUTEROL 0.5-2.5 (3) MG/3ML IN SOLN
3.0000 mL | Freq: Four times a day (QID) | RESPIRATORY_TRACT | Status: DC | PRN
  Administered 2020-06-04: 3 mL via RESPIRATORY_TRACT
  Filled 2020-06-04: qty 3

## 2020-06-04 NOTE — Progress Notes (Signed)
ANTICOAGULATION CONSULT NOTE - Follow Up Consult  Pharmacy Consult for Heparin Indication: atrial fibrillation  No Known Allergies  Patient Measurements: Height: 5\' 8"  (172.7 cm) Weight: 82.1 kg (181 lb) IBW/kg (Calculated) : 68.4 Heparin Dosing Weight:  84.8 kg  Vital Signs: Temp: 98.9 F (37.2 C) (06/10 0712) Temp Source: Axillary (06/10 0712) BP: 145/83 (06/10 0712) Pulse Rate: 93 (06/10 0712)  Labs: Recent Labs    06/02/20 1121 06/02/20 1312 06/02/20 1324 06/02/20 1556 06/02/20 1556 06/03/20 0604 06/04/20 0427  HGB 12.9*   < >  --  14.3   < > 14.9 15.2  HCT 39.4   < >  --  42.0  --  45.5 46.7  PLT 136*  --   --   --   --  181 172  APTT  --   --   --   --   --  101* 135*  HEPARINUNFRC  --   --   --   --   --  1.72* 1.54*  CREATININE 2.02*  --   --   --   --  1.71* 1.64*  TROPONINIHS 23*  --  31*  --   --   --   --    < > = values in this interval not displayed.    Estimated Creatinine Clearance: 31.9 mL/min (A) (by C-G formula based on SCr of 1.64 mg/dL (H)).  Assessment:  Anticoag: Heparin for afib while NPO (PTA apixaban - LD 6/7 PM) - Hgb 15.2, plts 172 stable. CT head neg for bleed. HL 1.54 elevated from Eliquis. APTT elevated. CHADS2VASC 6 (no DM)   Goal of Therapy:  aPTT 66-102 seconds Monitor platelets by anticoagulation protocol: Yes   Plan:  Refusing meds, spitting out Decrease IV heparin to 1000 units/hr Recheck aPTT in 6-8 hrs. Daily HL, aPTT and CBC.   Sylvester Salonga S. Alford Highland, PharmD, BCPS Clinical Staff Pharmacist [email or web address] Alford Highland, [employer] 06/04/2020,8:37 AM

## 2020-06-04 NOTE — Progress Notes (Signed)
Pt taken off bipap and placed on 15L HFNC. RN at bedside and aware. Pt in no distress, no increased WOB, VS within normal limits. RT will continue to monitor

## 2020-06-04 NOTE — Evaluation (Signed)
Clinical/Bedside Swallow Evaluation Patient Details  Name: Andrew [name] MRN: [id number] Date of Birth: [birth year]  Today's Date: 06/04/2020 Time: SLP Start Time (ACUTE ONLY): 51 SLP Stop Time (ACUTE ONLY): 1439 SLP Time Calculation (min) (ACUTE ONLY): 13 min  Past Medical History:  Past Medical History:  Diagnosis Date  . CHF (congestive heart failure) (HCC)   HPI:  86 year old male with a history of hypertension, hyperlipidemia, CHF, paroxysmal atrial fibrillation on Eliquis, carotid stenosis, AAA presented to the ED after a fall at home.  Apparently patient got from his chair felt dizzy and fell hitting his head.  Patient was found to be in acute respiratory failure with hypercapnia, started on BiPAP.  ABG showed PCO2 of 73.7.   Assessment / Plan / Recommendation Clinical Impression  Pt demonstrates normal swallowing ability. No signs of aspiration with consecutive swallows of thin liquids. Able to masticate regular solids. Pt and family deny anyifficulty swallowing. Will initiate a regular diet with thin liquids and sign off.  SLP Visit Diagnosis: Dysphagia, oropharyngeal phase (R13.12)    Aspiration Risk  Mild aspiration risk    Diet Recommendation Regular;Thin liquid   Liquid Administration via: Cup;Straw Medication Administration: Whole meds with liquid Supervision: Patient able to self feed Compensations: Slow rate;Small sips/bites Postural Changes: Seated upright at 90 degrees    Other  Recommendations Oral Care Recommendations: Oral care BID   Follow up Recommendations        Frequency and Duration            Prognosis        Swallow Study   General HPI: 86 year old male with a history of hypertension, hyperlipidemia, CHF, paroxysmal atrial fibrillation on Eliquis, carotid stenosis, AAA presented to the ED after a fall at home.  Apparently patient got from his chair felt dizzy and fell hitting his head.  Patient was found to be in acute respiratory failure  with hypercapnia, started on BiPAP.  ABG showed PCO2 of 73.7. Type of Study: Bedside Swallow Evaluation Diet Prior to this Study: NPO Temperature Spikes Noted: N/A Respiratory Status: Nasal cannula History of Recent Intubation: No Behavior/Cognition: Alert;Cooperative;Pleasant mood Oral Cavity Assessment: Dry Oral Care Completed by SLP: No Oral Cavity - Dentition: Adequate natural dentition Vision: Functional for self-feeding Self-Feeding Abilities: Able to feed self Patient Positioning: Upright in bed Baseline Vocal Quality: Normal Volitional Cough: Strong Volitional Swallow: Able to elicit    Oral/Motor/Sensory Function     Ice Chips Ice chips: Within functional limits   Thin Liquid Thin Liquid: Within functional limits    Nectar Thick Nectar Thick Liquid: Not tested   Honey Thick Honey Thick Liquid: Not tested   Puree Puree: Within functional limits   Solid     Solid: Within functional limits     Herbie [place], MA [place] Pager [phone] Office [phone]  Lynann Beaver 06/04/2020,2:42 PM

## 2020-06-04 NOTE — Progress Notes (Signed)
[facility]  PROGRESS NOTE  Andrew [name] WSF:[id number] DOB: 05-24-1928 DOA: 06/02/2020 PCP: Patient, No Pcp Per   Brief HPI:   86 year old male with a history of hypertension, hyperlipidemia, CHF, paroxysmal atrial fibrillation on Eliquis, carotid stenosis, AAA presented to the ED after a fall at home.  Apparently patient got from his chair felt dizzy and fell hitting his head.  Patient was found to be in acute respiratory failure with hypercapnia, started on BiPAP.  ABG showed PCO2 of 73.7.   Subjective   Patient seen and examined, he is off BiPAP now.  Not in respiratory distress.  Still requiring oxygen via nasal cannula.   Assessment/Plan:     1. Acute hypercapnic respiratory failure-significantly improved, secondary to COPD patient's ABG yesterday morning showed PCO2 of 48.4.  BiPAP has been weaned off.  Continue duo nebs every 6 hours.   2. Acute on chronic systolic CHF-patient takes Lasix at home.  BNP elevated at 1981.3 patient was started on Lasix 40 mg IV every 12 hours, adequate diuresis.  Renal function improving after diuresis.  Continue IV Lasix.  Follow BMP in a.m. 3. Uncontrolled hypertension-Home medications have been restarted, since patient is on BiPAP, oral medications have been held.  Coreg was held and patient started on  metoprolol 5 mg IV every 8 hours scheduled.  Continue as needed hydralazine 10 mg IV every 4 hours as needed.   4. Paroxysmal atrial fibrillation on chronic anticoagulation-patient is in normal sinus rhythm, patient started on heparin per pharmacy.  He takes Eliquis at home.  Will restart Eliquis as is off BiPAP.  Discontinue heparin.  Continue amiodarone 100 mg p.o. daily.  5. CKD stage IV-baseline creatinine around 2.  Today creatinine has improved to 1.64.  We will continue to monitor patient's creatinine in the hospital. 6. ?  Cellulitis-patient was treated with cephalexin for lower extremity cellulitis, no signs and symptoms of infection.   Looks more likely venous stasis.  Continue to monitor off antibiotics. 7. Hypothyroidism- continue synthroid.  8. Hypokalemia-potassium is 3.1, replace potassium and follow BMP in a.m.     SpO2: 95 % O2 Flow Rate (L/min): 10 L/min FiO2 (%): 60 %   COVID-19 Labs  No results for input(s): DDIMER, FERRITIN, LDH, CRP in the last 72 hours.  Lab Results  Component Value Date   [place] NEGATIVE 06/02/2020     CBG: Recent Labs  Lab 06/02/20 1127  GLUCAP 150*    CBC: Recent Labs  Lab 06/02/20 1121 06/02/20 1312 06/02/20 1556 06/03/20 0604 06/04/20 0427  WBC 6.9  --   --  12.4* 14.2*  NEUTROABS 4.5  --   --   --   --   HGB 12.9* 14.6 14.3 14.9 15.2  HCT 39.4 43.0 42.0 45.5 46.7  MCV 101.5*  --   --  100.2* 101.7*  PLT 136*  --   --  181 017    Basic Metabolic Panel: Recent Labs  Lab 06/02/20 1121 06/02/20 1312 06/02/20 1556 06/03/20 0604 06/04/20 0427  NA 141 142 144 144 148*  K 3.8 3.8 3.5 3.7 3.1*  CL 103  --   --  104 107  CO2 25  --   --  25 26  GLUCOSE 149*  --   --  162* 146*  BUN 42*  --   --  36* 36*  CREATININE 2.02*  --   --  1.71* 1.64*  CALCIUM 9.2  --   --  9.4 9.2  Liver Function Tests: Recent Labs  Lab 06/02/20 1121  AST 47*  ALT 30  ALKPHOS 65  BILITOT 1.0  PROT 6.9  ALBUMIN 3.6        DVT prophylaxis: Eliquis  Code Status: Full code  Family Communication: No family at bedside    Status is: Inpatient  Dispo: The patient is from: Home              Anticipated d/c is to: Home              Anticipated d/c date is: 06/06/2020              Patient currently not medically stable  Barrier to discharge-patient is acute respiratory failure with hypercapnia, receiving IV Lasix.        Scheduled medications:  . amiodarone  100 mg Oral Daily  . atorvastatin  80 mg Oral Daily  . chlorhexidine  15 mL Mouth Rinse BID  . cilostazol  100 mg Oral BID  . ezetimibe  10 mg Oral Daily  . FLUoxetine  20 mg Oral Daily  .  furosemide  40 mg Intravenous Q12H  . hydrALAZINE  50 mg Oral TID  . isosorbide mononitrate  30 mg Oral Daily  . levothyroxine  50 mcg Intravenous Daily  . mouth rinse  15 mL Mouth Rinse q12n4p  . metoprolol tartrate  5 mg Intravenous Q8H  . sodium chloride flush  3 mL Intravenous Q12H    Consultants:    Procedures:    Antibiotics:   Anti-infectives (From admission, onward)   None       Objective   Vitals:   06/04/20 0901 06/04/20 1100 06/04/20 1125 06/04/20 1146  BP: 128/75   (!) 161/85  Pulse: 86   87  Resp: 20   (!) 24  Temp:    97.9 F (36.6 C)  TempSrc:    Axillary  SpO2: 100% 100% 100% 95%  Weight:      Height:        Intake/Output Summary (Last 24 hours) at 06/04/2020 1340 Last data filed at 06/04/2020 4098 Gross per 24 hour  Intake 234.09 ml  Output 450 ml  Net -215.91 ml    06/08 1901 - 06/10 0700 In: 332 [I.V.:332] Out: 800 [Urine:800]  Filed Weights   06/02/20 2100 06/03/20 0125 06/04/20 0446  Weight: 84.8 kg 81.8 kg 82.1 kg    Physical Examination:   General-appears in no acute distress Heart-S1-S2, regular, no murmur auscultated Lungs-decreased breath sounds bilaterally Abdomen-soft, nontender, no organomegaly Extremities-no edema in the lower extremities Neuro-alert, oriented x3, no focal deficit noted   Data Reviewed:   Recent Results (from the past 240 hour(s))  SARS Coronavirus 2 by RT PCR (hospital order, performed in [facility] hospital lab) Nasopharyngeal Nasopharyngeal Swab     Status: None   Collection Time: 06/02/20 12:56 PM   Specimen: Nasopharyngeal Swab  Result Value Ref Range Status   SARS Coronavirus 2 NEGATIVE NEGATIVE Final    Comment: (NOTE) SARS-CoV-2 target nucleic acids are NOT DETECTED. The SARS-CoV-2 RNA is generally detectable in upper and lower respiratory specimens during the acute phase of infection. The lowest concentration of SARS-CoV-2 viral copies this assay can detect is 250 copies / mL. A  negative result does not preclude SARS-CoV-2 infection and should not be used as the sole basis for treatment or other patient management decisions.  A negative result may occur with improper specimen collection / handling, submission of specimen other than nasopharyngeal  swab, presence of viral mutation(s) within the areas targeted by this assay, and inadequate number of viral copies (<250 copies / mL). A negative result must be combined with clinical observations, patient history, and epidemiological information. Fact Sheet for Patients:   StrictlyIdeas.no Fact Sheet for Healthcare Providers: BankingDealers.co.za This test is not yet approved or cleared  by the [place] FDA and has been authorized for detection and/or diagnosis of SARS-CoV-2 by FDA under an Emergency Use Authorization (EUA).  This EUA will remain in effect (meaning this test can be used) for the duration of the COVID-19 declaration under Section 564(b)(1) of the Act, 21 U.S.C. section 360bbb-3(b)(1), unless the authorization is terminated or revoked sooner. Performed at [facility], [place] [address] [place], [place] [address]   Culture, blood (routine x 2)     Status: None (Preliminary result)   Collection Time: 06/02/20 10:04 PM   Specimen: BLOOD LEFT WRIST  Result Value Ref Range Status   Specimen Description BLOOD LEFT WRIST  Final   Special Requests   Final    BOTTLES DRAWN AEROBIC AND ANAEROBIC Blood Culture results may not be optimal due to an inadequate volume of blood received in culture bottles   Culture   Final    NO GROWTH 2 DAYS Performed at [facility], [place] [address] [place], [place] [address]    Report Status PENDING  Incomplete  Culture, blood (routine x 2)     Status: None (Preliminary result)   Collection Time: 06/02/20 10:04 PM   Specimen: BLOOD  Result Value Ref Range Status   Specimen Description BLOOD  Final   Special  Requests   Final    LEFT KNUCKLE BOTTLES DRAWN AEROBIC AND ANAEROBIC Blood Culture results may not be optimal due to an inadequate volume of blood received in culture bottles   Culture   Final    NO GROWTH 2 DAYS Performed at [facility], [place] [address] [place], [place] [address]    Report Status PENDING  Incomplete  MRSA PCR Screening     Status: None   Collection Time: 06/03/20 10:57 PM   Specimen: Nasal Mucosa; Nasopharyngeal  Result Value Ref Range Status   MRSA by PCR NEGATIVE NEGATIVE Final    Comment:        The GeneXpert MRSA Assay (FDA approved for NASAL specimens only), is one component of a comprehensive MRSA colonization surveillance program. It is not intended to diagnose MRSA infection nor to guide or monitor treatment for MRSA infections. Performed at [facility], [place] [address], [place], [place] [address]     No results for input(s): LIPASE, AMYLASE in the last 168 hours. No results for input(s): AMMONIA in the last 168 hours.  Cardiac Enzymes: No results for input(s): CKTOTAL, CKMB, CKMBINDEX, TROPONINI in the last 168 hours. BNP (last 3 results) Recent Labs    06/03/20 0604  BNP 1,981.3*        Oswald Hillock   [facility] If 7PM-7AM, please contact night-coverage at [email or web address], Office  [phone]   06/04/2020, 1:40 PM  LOS: 2 days

## 2020-06-04 NOTE — Progress Notes (Signed)
Pt more awake & alert. Pt appears calm and cooperating though still confused. Restraints discontinued. Off Bipap tolerating HFNC 10L without complaints. Attempted to give Pt sips of water and small bite of applesauce. Pt appeared to swallow well. Oral meds were administered, crushed in applesauce with no signs or symptoms of aspiration. MD aware. Orders put in for SLP eval. Will continue to monitor.

## 2020-06-04 NOTE — Progress Notes (Signed)
This RN crushed meds, and attempted to give to patient with applesauce. Patient repeatedly stated "no". This RN made several intermittent attempts d/t re-applying Bipap. Patient repeatedly spit out pills, applesauce, and water. Will continue to monitor.

## 2020-06-05 ENCOUNTER — Telehealth: Payer: Self-pay | Admitting: Family Medicine

## 2020-06-05 ENCOUNTER — Encounter (HOSPITAL_COMMUNITY): Payer: Self-pay | Admitting: Internal Medicine

## 2020-06-05 ENCOUNTER — Inpatient Hospital Stay (HOSPITAL_COMMUNITY): Payer: Medicare PPO

## 2020-06-05 LAB — BASIC METABOLIC PANEL
Anion gap: 15 (ref 5–15)
BUN: 50 mg/dL — ABNORMAL HIGH (ref 8–23)
CO2: 26 mmol/L (ref 22–32)
Calcium: 8.8 mg/dL — ABNORMAL LOW (ref 8.9–10.3)
Chloride: 105 mmol/L (ref 98–111)
Creatinine, Ser: 1.76 mg/dL — ABNORMAL HIGH (ref 0.61–1.24)
GFR calc Af Amer: 39 mL/min — ABNORMAL LOW (ref >60)
GFR calc non Af Amer: 34 mL/min — ABNORMAL LOW (ref >60)
Glucose, Bld: 130 mg/dL — ABNORMAL HIGH (ref 70–99)
Potassium: 3.2 mmol/L — ABNORMAL LOW (ref 3.5–5.1)
Sodium: 146 mmol/L — ABNORMAL HIGH (ref 135–145)

## 2020-06-05 LAB — CBC
HCT: 40.9 % (ref 39.0–52.0)
Hemoglobin: 13.2 g/dL (ref 13.0–17.0)
MCH: 33.2 pg (ref 26.0–34.0)
MCHC: 32.3 g/dL (ref 30.0–36.0)
MCV: 102.8 fL — ABNORMAL HIGH (ref 80.0–100.0)
Platelets: 135 10*3/uL — ABNORMAL LOW (ref 150–400)
RBC: 3.98 MIL/uL — ABNORMAL LOW (ref 4.22–5.81)
RDW: 13.3 % (ref 11.5–15.5)
WBC: 12 10*3/uL — ABNORMAL HIGH (ref 4.0–10.5)
nRBC: 0 % (ref 0.0–0.2)

## 2020-06-05 LAB — OCCULT BLOOD X 1 CARD TO LAB, STOOL: Fecal Occult Bld: POSITIVE — AB

## 2020-06-05 MED ORDER — SODIUM CHLORIDE 0.9 % IV BOLUS: 500.0000 mL | Freq: Once | INTRAVENOUS | Status: DC

## 2020-06-05 MED ORDER — SODIUM CHLORIDE 0.9 % IV BOLUS
500.0000 mL | Freq: Once | INTRAVENOUS | Status: AC
  Administered 2020-06-05: 500 mL via INTRAVENOUS

## 2020-06-05 MED ORDER — SODIUM CHLORIDE 0.9 % IV SOLN
INTRAVENOUS | Status: DC | PRN
  Administered 2020-06-05: 250 mL via INTRAVENOUS

## 2020-06-05 MED ORDER — HYDROCODONE-ACETAMINOPHEN 5-325 MG PO TABS
1.0000 | ORAL_TABLET | ORAL | Status: DC | PRN
  Administered 2020-06-06 – 2020-06-09 (×4): 1 via ORAL
  Filled 2020-06-05 (×4): qty 1

## 2020-06-05 MED ORDER — METHYLPREDNISOLONE SODIUM SUCC 40 MG IJ SOLR
40.0000 mg | Freq: Four times a day (QID) | INTRAMUSCULAR | Status: DC
  Administered 2020-06-05 – 2020-06-07 (×10): 40 mg via INTRAVENOUS
  Filled 2020-06-05 (×10): qty 1

## 2020-06-05 MED ORDER — POTASSIUM CHLORIDE CRYS ER 20 MEQ PO TBCR
40.0000 meq | EXTENDED_RELEASE_TABLET | Freq: Once | ORAL | Status: AC
  Administered 2020-06-05: 40 meq via ORAL
  Filled 2020-06-05: qty 2

## 2020-06-05 MED ORDER — GUAIFENESIN ER 600 MG PO TB12
1200.0000 mg | ORAL_TABLET | Freq: Two times a day (BID) | ORAL | Status: DC
  Administered 2020-06-05 – 2020-06-12 (×13): 1200 mg via ORAL
  Filled 2020-06-05 (×14): qty 2

## 2020-06-05 MED ORDER — SODIUM CHLORIDE 0.9 % IV SOLN
3.0000 g | Freq: Three times a day (TID) | INTRAVENOUS | Status: DC
  Administered 2020-06-05 (×2): 3 g via INTRAVENOUS
  Filled 2020-06-05 (×2): qty 3
  Filled 2020-06-05: qty 8

## 2020-06-05 MED ORDER — SODIUM CHLORIDE 0.9 % IV SOLN
3.0000 g | Freq: Two times a day (BID) | INTRAVENOUS | Status: DC
  Administered 2020-06-05 – 2020-06-11 (×12): 3 g via INTRAVENOUS
  Filled 2020-06-05 (×2): qty 8
  Filled 2020-06-05 (×2): qty 3
  Filled 2020-06-05 (×4): qty 8
  Filled 2020-06-05 (×4): qty 3
  Filled 2020-06-05: qty 8

## 2020-06-05 NOTE — Progress Notes (Signed)
   06/05/20 0600  Assess: MEWS Score  BP (!) 151/77  Pulse Rate 99  ECG Heart Rate (!) 104  Resp (!) 26  Level of Consciousness Alert  SpO2 92 %  O2 Device HFNC  Patient Activity (if Appropriate) In bed  O2 Flow Rate (L/min) 4 L/min  Assess: MEWS Score  MEWS Temp 0  MEWS Systolic 0  MEWS Pulse 1  MEWS RR 2  MEWS LOC 0  MEWS Score 3  MEWS Score Color Yellow  Assess: if the MEWS score is Yellow or Red  Were vital signs taken at a resting state? No (Patient restless.)  Focused Assessment Documented focused assessment  Early Detection of Sepsis Score *See Row Information* Low  MEWS guidelines implemented *See Row Information* Yes  Notify: Provider  Provider Name/Title M. Sharlet Salina, [place]  Date Provider Notified 06/05/20  Time Provider Notified [phone]  Notification Type Page  Notification Reason Other (Comment) (Bladder scan + 375 at most.)  Response No new orders   Patient attempted to urinate by purewick and urinal with no success. Will continue to monitor.

## 2020-06-05 NOTE — Progress Notes (Signed)
Pt cleared by SLP for diet.  Drinking water this evening with new onset of further confusion, hypotension, suspicion for aspiration.  CXR also suspicious for aspiration and worse than on admission.  Discussed with pharmacy, who recommended Unasyn.

## 2020-06-05 NOTE — Telephone Encounter (Signed)
Ok with me. Please place any necessary orders. 

## 2020-06-05 NOTE — Progress Notes (Signed)
Called dietary for patient tray to come up.

## 2020-06-05 NOTE — Progress Notes (Signed)
Patient BP 86/50.  Notified MD on call.  Will await orders and continue to monitor the patient

## 2020-06-05 NOTE — Progress Notes (Signed)
   06/04/20 2225  Assess: MEWS Score  Temp 97.7 F (36.5 C)  BP (!) 68/38  Pulse Rate 82  ECG Heart Rate 75  Resp (!) 25  SpO2 92 %  O2 Device HFNC  Patient Activity (if Appropriate) In bed  O2 Flow Rate (L/min) 6 L/min  Assess: MEWS Score  MEWS Temp 0  MEWS Systolic 3  MEWS Pulse 0  MEWS RR 1  MEWS LOC 0  MEWS Score 4  MEWS Score Color Red  Assess: if the MEWS score is Yellow or Red  Were vital signs taken at a resting state? Yes  Focused Assessment Documented focused assessment  Early Detection of Sepsis Score *See Row Information* Medium  MEWS guidelines implemented *See Row Information* Yes  Treat  MEWS Interventions Administered scheduled meds/treatments (Albumin IV x1.)  Take Vital Signs  Increase Vital Sign Frequency  Red: Q 1hr X 4 then Q 4hr X 4, if remains red, continue Q 4hrs  Escalate  MEWS: Escalate Red: discuss with charge nurse/RN and provider, consider discussing with RRT  Notify: Charge Nurse/RN  Name of Charge Nurse/RN Notified Ilsa Iha, RN  Date Charge Nurse/RN Notified 06/04/20  Time Charge Nurse/RN Notified 2226  Notify: Provider  Provider Name/Title M. Sharlet Salina, [place]  Date Provider Notified 06/04/20  Time Provider Notified 2220 (Paged 2nd time.)  Notification Type Page  Notification Reason Change in status  Response See new orders  Date of Provider Response 06/04/20  Time of Provider Response 2229   M. Sharlet Salina, FNP called this RN. Notified of VS. Albumin IV ordered. CXR ordered. Lasix IV was given earlier, but no urine output since. This RN bladder scanned patient. Highest amount 273 ml. With the pressure of the scanner, patient urinated via purewick. Patient urinated approximately 120 ml plus small amount that leaked from [place]. Largest amount from bladder scanner after urination was 115 ml. M. Sharlet Salina, [place] notified. With positioning patient to lay flat, patient became more awake and appeared to be in pain. BP was elevated at that time. Patient  assisted to reposition again for comfort. Will continue to monitor.

## 2020-06-05 NOTE — Progress Notes (Signed)
CRITICAL VALUE ALERT  Critical Value:  Occult stool positive  Date & Time Notied:  06/05/20  0130  Provider Notified: M. Sharlet Salina, [place]  Orders Received/Actions taken: Awaiting response.

## 2020-06-05 NOTE — Telephone Encounter (Signed)
Andrew [name] is calling stating they received a referral from the hospital for home health services, Physical therapy and Occupational therapy and the provider following is going to be Dr.Parker so they are asking for verbal orders.

## 2020-06-05 NOTE — Discharge Instructions (Signed)

## 2020-06-05 NOTE — Telephone Encounter (Signed)
Please advise 

## 2020-06-05 NOTE — Progress Notes (Signed)
°   06/05/20 0032  Assess: MEWS Score  Pulse Rate 73  ECG Heart Rate (!) 57  Resp (!) 22  SpO2 95 %  O2 Device HFNC  Patient Activity (if Appropriate) In bed  O2 Flow Rate (L/min) 6 L/min  Assess: MEWS Score  MEWS Temp 0  MEWS Systolic 2  MEWS Pulse 0  MEWS RR 1  MEWS LOC 2  MEWS Score 5  MEWS Score Color Red  Assess: if the MEWS score is Yellow or Red  Were vital signs taken at a resting state? Yes  Focused Assessment Documented focused assessment  Early Detection of Sepsis Score *See Row Information* Low  MEWS guidelines implemented *See Row Information* Yes  Treat  MEWS Interventions Other (Comment) (EKG)  Escalate  MEWS: Escalate Red: discuss with charge nurse/RN and provider, consider discussing with RRT  Notify: Charge Nurse/RN  Name of Charge Nurse/RN Notified Ilsa Iha, RN  Date Charge Nurse/RN Notified 06/05/20  Time Charge Nurse/RN Notified 0035  Notify: Provider  Provider Name/Title M. Sharlet Salina, [place]  Date Provider Notified 06/05/20  Time Provider Notified [phone]  Notification Type Page  Notification Reason Other (Comment) (Patient's heart rhythm converted to A-Flutter.)  Response See new orders  Date of Provider Response 06/05/20  Time of Provider Response 0131    Per tele. M. Sharlet Salina, [place] notified. Then, Patient's heart rhythm converted to A-fib. M. Sharlet Salina, [place] notified at [phone]. EKG ordered. Done by Nicanor Alcon, FNP notified of results. Unasyn IV given earlier as ordered for aspiration PNA. No further orders at this time. Patient remains with eyes closed, but attempts to answer questions more often than previous assessment. Will continue to monitor.

## 2020-06-05 NOTE — Progress Notes (Signed)
   06/04/20 2203  Assess: MEWS Score  Temp 98.4 F (36.9 C)  BP (!) 70/43  Pulse Rate 86  ECG Heart Rate 86  Resp (!) 21  Level of Consciousness Responds to Pain  SpO2 97 %  O2 Device HFNC  Patient Activity (if Appropriate) In bed  O2 Flow Rate (L/min) 6 L/min  Assess: MEWS Score  MEWS Temp 0  MEWS Systolic 2  MEWS Pulse 0  MEWS RR 1  MEWS LOC 2  MEWS Score 5  MEWS Score Color Red  Assess: if the MEWS score is Yellow or Red  Were vital signs taken at a resting state? Yes  Focused Assessment Documented focused assessment  Early Detection of Sepsis Score *See Row Information* Medium  MEWS guidelines implemented *See Row Information* Yes  Take Vital Signs  Increase Vital Sign Frequency  Yellow: Q 2hr X 2 then Q 4hr X 2, if remains yellow, continue Q 4hrs  Escalate  MEWS: Escalate Yellow: discuss with charge nurse/RN and consider discussing with provider and RRT  Notify: Charge Nurse/RN  Name of Charge Nurse/RN Notified Ilsa Iha, RN  Date Charge Nurse/RN Notified 06/04/20  Time Charge Nurse/RN Notified 2203  Notify: Provider  Provider Name/Title M. Sharlet Salina, [place]  Date Provider Notified 06/04/20  Time Provider Notified 2204 (Approximate. Paged by Ilsa Iha, Charge RN.)  Notification Type Page  Notification Reason Change in status  Response See new orders  Date of Provider Response 06/04/20  Time of Provider Response 2229   Patient was A&O to self only. Patient had been incontinent of urine. While NT bathing, patient was c/o left hip pain. This RN gave patient HS meds crushed in applesauce. Lung sounds rhonchus bilaterally. Patient pleasant and cooperative with assessment and taking meds, no difficulty. Patient repeatedly asking for water. Patient drank approximately 360 ml with no difficulty, no coughing. After patient drank water, patient c/o SOB and Rhonchi increased to be audible without stethoscope. SpO2 remained WNL at 97% on O2 6L HFNC. Lasix was given IV as ordered.  This RN called RRT to request breathing tx. M. Sharlet Salina, [place] paged. Will continue to monitor.

## 2020-06-05 NOTE — Telephone Encounter (Signed)
Notified Ok for [employer]

## 2020-06-05 NOTE — Progress Notes (Signed)
Modified Barium Swallow Progress Note  Patient Details  Name: Andrew [name] MRN: [id number] Date of Birth: 04/09/1928  Today's Date: 06/05/2020  Modified Barium Swallow completed.  Full report located under Chart Review in the Imaging Section.  Brief recommendations include the following:  Clinical Impression  Pt demonstrates a mild pharyngeal dysphagia with instances of flash and trace penetration with sensation. Pt was lethargic during testing with wet upper airway sounds. Despite this, he was attentive to intake and constantly begging to drink more. He consumed rapid consecutive straw sips with sustained laryngeal closure, but instances of penetration of barium into the vestibule during or after the swallow, that was ejected post swallow by squeeze or by slight throat clear. Suspect standing secretions based on pts airway sounds and appearance on fluoro. Pt did have the appearance of a large bony protrusion at C5/6 right at the UES, that contributed to mild backflow of barium to the pyriforms. Esophageal sweep also reveal stasis, which could also be contributing. Recommend pt continue to have access to water and thin liquids. He may benefit from a break from food given concern for potential reflux or backflow, until he is better alert and sitting up well for meals. Will f/u for tolerance given MD concern regarding aspiration.    Swallow Evaluation Recommendations       SLP Diet Recommendations: Thin liquid   Liquid Administration via: Cup;Straw   Medication Administration: Crushed with puree   Supervision: Staff to assist with self feeding   Compensations: Slow rate;Small sips/bites   Postural Changes: Remain semi-upright after after feeds/meals (Comment);Seated upright at 90 degrees   Oral Care Recommendations: Oral care BID        Larina Lieurance, Katherene Ponto 06/05/2020,2:28 PM

## 2020-06-05 NOTE — Progress Notes (Addendum)
[facility]  PROGRESS NOTE  TREVINO Andrew [name]:[id number] DOB: 1928-12-05 DOA: 06/02/2020 PCP: Andrew [name], No Pcp Per   Brief HPI:   86 year old male with a history of hypertension, hyperlipidemia, CHF, paroxysmal atrial fibrillation on Eliquis, carotid stenosis, AAA presented to the ED after a fall at home.  Apparently Andrew [name] got from his chair felt dizzy and fell hitting his head.  Andrew [name] was found to be in acute respiratory failure with hypercapnia, started on BiPAP.  ABG showed PCO2 of 73.7.   Subjective   Andrew [name] seen and examined, he is off BiPAP.  Last night had a coughing spell after he drank water, there was concern for aspiration.  Chest x-ray showed aspiration pneumonia.  Andrew [name] started on Unasyn.   Assessment/Plan:     1. Acute hypercapnic respiratory failure-significantly improved, secondary to COPD Andrew [name]'s ABG yesterday morning showed PCO2 of 48.4.  BiPAP has been weaned off.  Continue duo nebs every 6 hours.  Andrew [name] started on Solu-Medrol 60 mg IV every 6 hours.  Start Mucinex 1 tablet p.o. twice daily, flutter valve every 4 hours. 2. Aspiration pneumonia-we will reconsult speech therapy to reevaluate for dysphagia.  Continue IV Unasyn. 3. Acute on chronic systolic CHF-Andrew [name] takes Lasix at home.  BNP elevated at 1981.3 Andrew [name] was started on Lasix 40 mg IV every 12 hours, adequate diuresis.  Renal function little worse today.  Will hold IV Lasix.  Check BMP in a.m.  If renal function is stable, consider starting p.o. Lasix. 4. Uncontrolled hypertension-Home medications have been restarted, since Andrew [name] is on BiPAP, oral medications have been held.  Coreg was held and Andrew [name] started on  metoprolol 5 mg IV every 8 hours scheduled.  Continue as needed hydralazine 10 mg IV every 4 hours as needed.   5. Paroxysmal atrial fibrillation on chronic anticoagulation-Andrew [name] is in normal sinus rhythm, Andrew [name] started on heparin per pharmacy.  He takes Eliquis at home.  Will  restart Eliquis as is off BiPAP.  Discontinue heparin.  Continue amiodarone 100 mg p.o. daily.  6. CKD stage IV-baseline creatinine around 2.  Today creatinine has improved to 1.64.  We will continue to monitor Andrew [name]'s creatinine in the hospital. 7. ? Cellulitis-Andrew [name] was treated with cephalexin for lower extremity cellulitis, no signs and symptoms of infection.  Looks more likely venous stasis.  Continue to monitor off antibiotics. 8. Hypothyroidism- continue synthroid.  9. Hypokalemia-potassium is 3.2, will replace potassium and follow BMP in a.m.     SpO2: 95 % O2 Flow Rate (L/min): 4 L/min FiO2 (%): 60 %   COVID-19 Labs  No results for input(s): DDIMER, FERRITIN, LDH, CRP in the last 72 hours.  Lab Results  Component Value Date   [place] NEGATIVE 06/02/2020     CBG: Recent Labs  Lab 06/02/20 1127  GLUCAP 150*    CBC: Recent Labs  Lab 06/02/20 1121 06/02/20 1121 06/02/20 1312 06/02/20 1556 06/03/20 0604 06/04/20 0427 06/05/20 0500  WBC 6.9  --   --   --  12.4* 14.2* 12.0*  NEUTROABS 4.5  --   --   --   --   --   --   HGB 12.9*   < > 14.6 14.3 14.9 15.2 13.2  HCT 39.4   < > 43.0 42.0 45.5 46.7 40.9  MCV 101.5*  --   --   --  100.2* 101.7* 102.8*  PLT 136*  --   --   --  181 172 135*   < > = values in  this interval not displayed.    Basic Metabolic Panel: Recent Labs  Lab 06/02/20 1121 06/02/20 1121 06/02/20 1312 06/02/20 1556 06/03/20 0604 06/04/20 0427 06/05/20 0500  NA 141   < > 142 144 144 148* 146*  K 3.8   < > 3.8 3.5 3.7 3.1* 3.2*  CL 103  --   --   --  104 107 105  CO2 25  --   --   --  25 26 26   GLUCOSE 149*  --   --   --  162* 146* 130*  BUN 42*  --   --   --  36* 36* 50*  CREATININE 2.02*  --   --   --  1.71* 1.64* 1.76*  CALCIUM 9.2  --   --   --  9.4 9.2 8.8*   < > = values in this interval not displayed.     Liver Function Tests: Recent Labs  Lab 06/02/20 1121  AST 47*  ALT 30  ALKPHOS 65  BILITOT 1.0  PROT 6.9   ALBUMIN 3.6        DVT prophylaxis: Eliquis  Code Status: Full code  Family Communication: No family at bedside    Status is: Inpatient  Dispo: The Andrew [name] is from: Home              Anticipated d/c is to: Home              Anticipated d/c date is: 06/06/2020              Andrew [name] currently not medically stable  Barrier to discharge-Andrew [name] is acute respiratory failure with hypercapnia, receiving IV solumedrol        Scheduled medications:  . amiodarone  100 mg Oral Daily  . apixaban  2.5 mg Oral BID  . atorvastatin  80 mg Oral Daily  . chlorhexidine  15 mL Mouth Rinse BID  . cilostazol  100 mg Oral BID  . ezetimibe  10 mg Oral Daily  . FLUoxetine  20 mg Oral Daily  . guaiFENesin  1,200 mg Oral BID  . hydrALAZINE  50 mg Oral TID  . isosorbide mononitrate  30 mg Oral Daily  . levothyroxine  50 mcg Intravenous Daily  . mouth rinse  15 mL Mouth Rinse q12n4p  . methylPREDNISolone (SOLU-MEDROL) injection  40 mg Intravenous Q6H  . metoprolol tartrate  5 mg Intravenous Q8H  . sodium chloride flush  3 mL Intravenous Q12H    Consultants:    Procedures:    Antibiotics:   Anti-infectives (From admission, onward)   Start     Dose/Rate Route Frequency Ordered Stop   06/05/20 1300  Ampicillin-Sulbactam (UNASYN) 3 g in sodium chloride 0.9 % 100 mL IVPB     Discontinue     3 g 200 mL/hr over 30 Minutes Intravenous Every 12 hours 06/05/20 0909 06/12/20 0059   06/05/20 0100  Ampicillin-Sulbactam (UNASYN) 3 g in sodium chloride 0.9 % 100 mL IVPB  Status:  Discontinued        3 g 200 mL/hr over 30 Minutes Intravenous Every 8 hours 06/05/20 0030 06/05/20 0909       Objective   Vitals:   06/05/20 0400 06/05/20 0600 06/05/20 0642 06/05/20 0740  BP: 138/63 (!) 151/77  110/67  Pulse: 92 99 97 93  Resp: (!) 23 (!) 26 (!) 25 20  Temp: 97.8 F (36.6 C)   98.1 F (36.7 C)  TempSrc: Oral   Oral  SpO2: 94% 92% 94% 95%  Weight:      Height:        Intake/Output  Summary (Last 24 hours) at 06/05/2020 1322 Last data filed at 06/05/2020 1235 Gross per 24 hour  Intake 832.92 ml  Output 1060 ml  Net -227.08 ml    06/09 1901 - 06/11 0700 In: 5400 [P.O.:360; I.V.:346.7] Out: 1310 [Urine:1310]  Filed Weights   06/02/20 2100 06/03/20 0125 06/04/20 0446  Weight: 84.8 kg 81.8 kg 82.1 kg    Physical Examination:   General-appears in no acute distress Heart-S1-S2, regular, no murmur auscultated Lungs-bilateral rhonchi auscultated Abdomen-soft, nontender, no organomegaly Extremities-no edema in the lower extremities Neuro-alert, oriented x3, no focal deficit noted   Data Reviewed:   Recent Results (from the past 240 hour(s))  SARS Coronavirus 2 by RT PCR (hospital order, performed in [place] hospital lab) Nasopharyngeal Nasopharyngeal Swab     Status: None   Collection Time: 06/02/20 12:56 PM   Specimen: Nasopharyngeal Swab  Result Value Ref Range Status   SARS Coronavirus 2 NEGATIVE NEGATIVE Final    Comment: (NOTE) SARS-CoV-2 target nucleic acids are NOT DETECTED. The SARS-CoV-2 RNA is generally detectable in upper and lower respiratory specimens during the acute phase of infection. The lowest concentration of SARS-CoV-2 viral copies this assay can detect is 250 copies / mL. A negative result does not preclude SARS-CoV-2 infection and should not be used as the sole basis for treatment or other Andrew [name] management decisions.  A negative result may occur with improper specimen collection / handling, submission of specimen other than nasopharyngeal swab, presence of viral mutation(s) within the areas targeted by this assay, and inadequate number of viral copies (<250 copies / mL). A negative result must be combined with clinical observations, Andrew [name] history, and epidemiological information. Fact Sheet for Patients:   StrictlyIdeas.no Fact Sheet for Healthcare  Providers: BankingDealers.co.za This test is not yet approved or cleared  by the [place] FDA and has been authorized for detection and/or diagnosis of SARS-CoV-2 by FDA under an Emergency Use Authorization (EUA).  This EUA will remain in effect (meaning this test can be used) for the duration of the COVID-19 declaration under Section 564(b)(1) of the Act, 21 U.S.C. section 360bbb-3(b)(1), unless the authorization is terminated or revoked sooner. Performed at [facility], [place] [address], [place], [place] [address]   Culture, blood (routine x 2)     Status: None (Preliminary result)   Collection Time: 06/02/20 10:04 PM   Specimen: BLOOD LEFT WRIST  Result Value Ref Range Status   Specimen Description BLOOD LEFT WRIST  Final   Special Requests   Final    BOTTLES DRAWN AEROBIC AND ANAEROBIC Blood Culture results may not be optimal due to an inadequate volume of blood received in culture bottles   Culture   Final    NO GROWTH 3 DAYS Performed at [facility], [place] [address] [place], [place] [address]    Report Status PENDING  Incomplete  Culture, blood (routine x 2)     Status: None (Preliminary result)   Collection Time: 06/02/20 10:04 PM   Specimen: BLOOD  Result Value Ref Range Status   Specimen Description BLOOD  Final   Special Requests   Final    LEFT KNUCKLE BOTTLES DRAWN AEROBIC AND ANAEROBIC Blood Culture results may not be optimal due to an inadequate volume of blood received in culture bottles   Culture   Final    NO GROWTH  3 DAYS Performed at [facility], [place] [address], [place], [place] [address]    Report Status PENDING  Incomplete  MRSA PCR Screening     Status: None   Collection Time: 06/03/20 10:57 PM   Specimen: Nasal Mucosa; Nasopharyngeal  Result Value Ref Range Status   MRSA by PCR NEGATIVE NEGATIVE Final    Comment:        The GeneXpert MRSA Assay (FDA approved for NASAL specimens only), is one component of  a comprehensive MRSA colonization surveillance program. It is not intended to diagnose MRSA infection nor to guide or monitor treatment for MRSA infections. Performed at [facility], [place] [address], [place], [place] [address]      BNP (last 3 results) Recent Labs    06/03/20 0604  BNP 1,981.3*     Oswald Hillock   [facility] If 7PM-7AM, please contact night-coverage at [email or web address], Office  [phone]   06/05/2020, 1:22 PM  LOS: 3 days

## 2020-06-05 NOTE — Progress Notes (Signed)
Patient is more active this afternoon while his wife is in the room. Patient is attempting to climb out of bed and pull on things.  Patient is being given constant reminders to not get up but is just agitated.

## 2020-06-06 LAB — CBC
HCT: 39.6 % (ref 39.0–52.0)
Hemoglobin: 12.9 g/dL — ABNORMAL LOW (ref 13.0–17.0)
MCH: 33.2 pg (ref 26.0–34.0)
MCHC: 32.6 g/dL (ref 30.0–36.0)
MCV: 102.1 fL — ABNORMAL HIGH (ref 80.0–100.0)
Platelets: 147 10*3/uL — ABNORMAL LOW (ref 150–400)
RBC: 3.88 MIL/uL — ABNORMAL LOW (ref 4.22–5.81)
RDW: 13 % (ref 11.5–15.5)
WBC: 8.1 10*3/uL (ref 4.0–10.5)
nRBC: 0 % (ref 0.0–0.2)

## 2020-06-06 LAB — BASIC METABOLIC PANEL
Anion gap: 15 (ref 5–15)
BUN: 60 mg/dL — ABNORMAL HIGH (ref 8–23)
CO2: 24 mmol/L (ref 22–32)
Calcium: 8.7 mg/dL — ABNORMAL LOW (ref 8.9–10.3)
Chloride: 106 mmol/L (ref 98–111)
Creatinine, Ser: 1.83 mg/dL — ABNORMAL HIGH (ref 0.61–1.24)
GFR calc Af Amer: 37 mL/min — ABNORMAL LOW (ref >60)
GFR calc non Af Amer: 32 mL/min — ABNORMAL LOW (ref >60)
Glucose, Bld: 210 mg/dL — ABNORMAL HIGH (ref 70–99)
Potassium: 3.6 mmol/L (ref 3.5–5.1)
Sodium: 145 mmol/L (ref 135–145)

## 2020-06-06 MED ORDER — LEVOTHYROXINE SODIUM 100 MCG PO TABS
100.0000 ug | ORAL_TABLET | Freq: Every day | ORAL | Status: DC
  Administered 2020-06-07 – 2020-06-12 (×5): 100 ug via ORAL
  Filled 2020-06-06 (×5): qty 1

## 2020-06-06 MED ORDER — FUROSEMIDE 40 MG PO TABS
40.0000 mg | ORAL_TABLET | Freq: Two times a day (BID) | ORAL | Status: DC
  Administered 2020-06-06 – 2020-06-12 (×12): 40 mg via ORAL
  Filled 2020-06-06 (×12): qty 1

## 2020-06-06 MED ORDER — CARVEDILOL 12.5 MG PO TABS
12.5000 mg | ORAL_TABLET | Freq: Two times a day (BID) | ORAL | Status: DC
  Administered 2020-06-06 – 2020-06-12 (×13): 12.5 mg via ORAL
  Filled 2020-06-06 (×13): qty 1

## 2020-06-06 MED ORDER — HYDRALAZINE HCL 50 MG PO TABS
50.0000 mg | ORAL_TABLET | Freq: Three times a day (TID) | ORAL | Status: DC
  Administered 2020-06-06 – 2020-06-12 (×18): 50 mg via ORAL
  Filled 2020-06-06 (×18): qty 1

## 2020-06-06 NOTE — Progress Notes (Signed)
Patient stood and ambulated a few steps to the bedside commode today without difficulty.  Patient oxygen saturation did drop some but patient denied having any dyspnea.  Patient is now back in bed in no distress.

## 2020-06-06 NOTE — Progress Notes (Signed)
Patient spouse is at bedside visiting.

## 2020-06-06 NOTE — Progress Notes (Signed)
Patient BP is increased.   Patient is more alert today and able to hold a drink and an applesauce. Patient is aware he is in the hospital. Patient is sitting upright and in no acute distress.

## 2020-06-06 NOTE — Progress Notes (Addendum)
Andrew [name] NOTE  Andrew [name] GBE:[id number] DOB: 1928/07/22 DOA: 06/02/2020 PCP: Patient, No Pcp Per   Brief HPI:   86 year old male with a history of hypertension, hyperlipidemia, CHF, paroxysmal atrial fibrillation on Eliquis, carotid stenosis, AAA presented to the ED after a fall at home.  Apparently patient got from his chair felt dizzy and fell hitting his head.  Patient was found to be in acute respiratory failure with hypercapnia, started on BiPAP.  ABG showed PCO2 of 73.7.   Subjective   Patient seen and examined, this morning.  He has been off BiPAP for 2 days now.  Breathing has improved.   Assessment/Plan:     1. Acute hypercapnic respiratory failure-significantly improved, secondary to COPD patient's ABG yesterday morning showed PCO2 of 48.4.  BiPAP has been weaned off.  Continue duo nebs every 6 hours.  Patient started on Solu-Medrol 60 mg IV every 6 hours.  Mucinex 1 tablet p.o. twice daily, flutter valve every 4 hours. 2. Aspiration pneumonia-we will reconsult speech therapy to reevaluate for dysphagia.  Continue IV Unasyn. 3. Acute on chronic systolic CHF-patient takes Lasix at home.  BNP elevated at 1981.3 patient was started on Lasix 40 mg IV every 12 hours, adequate diuresis.  Renal function little worse today.  IV Lasix was held.  Will restart Lasix 40 mg p.o. twice daily from tonight.  Patient takes Lasix 120 mg in a.m. and 80 mg in p.m.  Follow BMP in a.m.   4. Uncontrolled hypertension-Home medications have been restarted, since patient is on BiPAP, oral medications have been held.  Restart home medication including Coreg and hydralazine. 5. Paroxysmal atrial fibrillation on chronic anticoagulation-patient is in normal sinus rhythm, patient started on heparin per pharmacy.  He takes Eliquis at home.  Will restart Eliquis as is off BiPAP.  Heparin was discontinued.  Continue amiodarone 100 mg p.o. daily.  6. CKD stage IV-baseline creatinine  around 2.  Today creatinine has improved to 1.64.  We will continue to monitor patient's creatinine in the hospital. 7. ? Cellulitis-patient was treated with cephalexin for lower extremity cellulitis, no signs and symptoms of infection.  Looks more likely venous stasis.  Continue to monitor off antibiotics. 8. Hypothyroidism- continue synthroid.  9. Hypokalemia-replete     SpO2: 97 % O2 Flow Rate (L/min): 2 L/min FiO2 (%): 60 %   COVID-19 Labs  No results for input(s): DDIMER, FERRITIN, LDH, CRP in the last 72 hours.  Lab Results  Component Value Date   [place] NEGATIVE 06/02/2020     CBG: Recent Labs  Lab 06/02/20 1127  GLUCAP 150*    CBC: Recent Labs  Lab 06/02/20 1121 06/02/20 1312 06/02/20 1556 06/03/20 0604 06/04/20 0427 06/05/20 0500 06/06/20 0301  WBC 6.9  --   --  12.4* 14.2* 12.0* 8.1  NEUTROABS 4.5  --   --   --   --   --   --   HGB 12.9*   < > 14.3 14.9 15.2 13.2 12.9*  HCT 39.4   < > 42.0 45.5 46.7 40.9 39.6  MCV 101.5*  --   --  100.2* 101.7* 102.8* 102.1*  PLT 136*  --   --  181 172 135* 147*   < > = values in this interval not displayed.    Basic Metabolic Panel: Recent Labs  Lab 06/02/20 1121 06/02/20 1312 06/02/20 1556 06/03/20 0604 06/04/20 0427 06/05/20 0500 06/06/20 0301  NA 141   < > 144 144 148* 146*  145  K 3.8   < > 3.5 3.7 3.1* 3.2* 3.6  CL 103  --   --  104 107 105 106  CO2 25  --   --  25 26 26 24   GLUCOSE 149*  --   --  162* 146* 130* 210*  BUN 42*  --   --  36* 36* 50* 60*  CREATININE 2.02*  --   --  1.71* 1.64* 1.76* 1.83*  CALCIUM 9.2  --   --  9.4 9.2 8.8* 8.7*   < > = values in this interval not displayed.     Liver Function Tests: Recent Labs  Lab 06/02/20 1121  AST 47*  ALT 30  ALKPHOS 65  BILITOT 1.0  PROT 6.9  ALBUMIN 3.6        DVT prophylaxis: Eliquis  Code Status: Full code  Family Communication: No family at bedside    Status is: Inpatient  Dispo: The patient is from: Home               Anticipated d/c is to: Home              Anticipated d/c date is: 06/08/2020              Patient currently not medically stable  Barrier to discharge-patient is acute respiratory failure with hypercapnia, receiving IV solumedrol        Scheduled medications:  . amiodarone  100 mg Oral Daily  . apixaban  2.5 mg Oral BID  . atorvastatin  80 mg Oral Daily  . carvedilol  12.5 mg Oral BID  . chlorhexidine  15 mL Mouth Rinse BID  . cilostazol  100 mg Oral BID  . ezetimibe  10 mg Oral Daily  . FLUoxetine  20 mg Oral Daily  . furosemide  40 mg Oral BID  . guaiFENesin  1,200 mg Oral BID  . hydrALAZINE  50 mg Oral TID  . isosorbide mononitrate  30 mg Oral Daily  . [START ON 06/07/2020] levothyroxine  100 mcg Oral Q0600  . mouth rinse  15 mL Mouth Rinse q12n4p  . methylPREDNISolone (SOLU-MEDROL) injection  40 mg Intravenous Q6H  . sodium chloride flush  3 mL Intravenous Q12H    Consultants:    Procedures:    Antibiotics:   Anti-infectives (From admission, onward)   Start     Dose/Rate Route Frequency Ordered Stop   06/05/20 1300  Ampicillin-Sulbactam (UNASYN) 3 g in sodium chloride 0.9 % 100 mL IVPB     Discontinue     3 g 200 mL/hr over 30 Minutes Intravenous Every 12 hours 06/05/20 0909 06/12/20 0059   06/05/20 0100  Ampicillin-Sulbactam (UNASYN) 3 g in sodium chloride 0.9 % 100 mL IVPB  Status:  Discontinued        3 g 200 mL/hr over 30 Minutes Intravenous Every 8 hours 06/05/20 0030 06/05/20 0909       Objective   Vitals:   06/06/20 0042 06/06/20 0545 06/06/20 0900 06/06/20 1200  BP: (!) 155/77 (!) 155/79 (!) 163/86 133/65  Pulse: 94 87 90 82  Resp: 20 (!) 21 (!) 23 (!) 21  Temp: 97.9 F (36.6 C) 97.9 F (36.6 C) 98.6 F (37 C) (!) 97.5 F (36.4 C)  TempSrc: Axillary Axillary Oral Oral  SpO2: 93% 95% 97% 97%  Weight:      Height:        Intake/Output Summary (Last 24 hours) at 06/06/2020 1403 Last data filed at  06/06/2020 1100 Gross per 24 hour   Intake 1469.72 ml  Output 851 ml  Net 618.72 ml    06/10 1901 - 06/12 0700 In: 1295.2 [P.O.:714; I.V.:20.1] Out: 1510 [Urine:1510]  Filed Weights   06/02/20 2100 06/03/20 0125 06/04/20 0446  Weight: 84.8 kg 81.8 kg 82.1 kg    Physical Examination:   General-appears in no acute distress Heart-S1-S2, regular, no murmur auscultated Lungs-clear to auscultation bilaterally, no wheezing or crackles auscultated Abdomen-soft, nontender, no organomegaly Extremities-no edema in the lower extremities Neuro-alert, oriented x3, no focal deficit noted  Data Reviewed:   Recent Results (from the past 240 hour(s))  SARS Coronavirus 2 by RT PCR (hospital order, performed in [place] hospital lab) Nasopharyngeal Nasopharyngeal Swab     Status: None   Collection Time: 06/02/20 12:56 PM   Specimen: Nasopharyngeal Swab  Result Value Ref Range Status   SARS Coronavirus 2 NEGATIVE NEGATIVE Final    Comment: (NOTE) SARS-CoV-2 target nucleic acids are NOT DETECTED. The SARS-CoV-2 RNA is generally detectable in upper and lower respiratory specimens during the acute phase of infection. The lowest concentration of SARS-CoV-2 viral copies this assay can detect is 250 copies / mL. A negative result does not preclude SARS-CoV-2 infection and should not be used as the sole basis for treatment or other patient management decisions.  A negative result may occur with improper specimen collection / handling, submission of specimen other than nasopharyngeal swab, presence of viral mutation(s) within the areas targeted by this assay, and inadequate number of viral copies (<250 copies / mL). A negative result must be combined with clinical observations, patient history, and epidemiological information. Fact Sheet for Patients:   StrictlyIdeas.no Fact Sheet for Healthcare Providers: BankingDealers.co.za This test is not yet approved or cleared  by the [place] FDA and has been authorized for detection and/or diagnosis of SARS-CoV-2 by FDA under an Emergency Use Authorization (EUA).  This EUA will remain in effect (meaning this test can be used) for the duration of the COVID-19 declaration under Section 564(b)(1) of the Act, 21 U.S.C. section 360bbb-3(b)(1), unless the authorization is terminated or revoked sooner. Performed at [facility], [place] [address] [place], [place] [address]   Culture, blood (routine x 2)     Status: None (Preliminary result)   Collection Time: 06/02/20 10:04 PM   Specimen: BLOOD LEFT WRIST  Result Value Ref Range Status   Specimen Description BLOOD LEFT WRIST  Final   Special Requests   Final    BOTTLES DRAWN AEROBIC AND ANAEROBIC Blood Culture results may not be optimal due to an inadequate volume of blood received in culture bottles   Culture   Final    NO GROWTH 4 DAYS Performed at [facility], [place] [address], [place], [place] [address]    Report Status PENDING  Incomplete  Culture, blood (routine x 2)     Status: None (Preliminary result)   Collection Time: 06/02/20 10:04 PM   Specimen: BLOOD  Result Value Ref Range Status   Specimen Description BLOOD  Final   Special Requests   Final    LEFT KNUCKLE BOTTLES DRAWN AEROBIC AND ANAEROBIC Blood Culture results may not be optimal due to an inadequate volume of blood received in culture bottles   Culture   Final    NO GROWTH 4 DAYS Performed at [facility], [place] [address] [place], [place] [address]    Report Status PENDING  Incomplete  MRSA PCR Screening  Status: None   Collection Time: 06/03/20 10:57 PM   Specimen: Nasal Mucosa; Nasopharyngeal  Result Value Ref Range Status   MRSA by PCR NEGATIVE NEGATIVE Final    Comment:        The GeneXpert MRSA Assay (FDA approved for NASAL specimens only), is one component of a comprehensive MRSA colonization surveillance program. It is not intended to diagnose MRSA infection nor to  guide or monitor treatment for MRSA infections. Performed at [facility], [place] [address] [place],  [address]      BNP (last 3 results) Recent Labs    06/03/20 0604  BNP 1,981.3*     Oswald Hillock   [facility] If 7PM-7AM, please contact night-coverage at [email or web address], Office  [phone]   06/06/2020, 2:03 PM  LOS: 4 days

## 2020-06-06 NOTE — Progress Notes (Signed)
  Speech Language Pathology Treatment: Dysphagia  Patient Details Name: Andrew [name] MRN: [id number] DOB: 11-18-1928 Today's Date: 06/06/2020 Time: 1410-1430 SLP Time Calculation (min) (ACUTE ONLY): 20 min  Assessment / Plan / Recommendation Clinical Impression  Patient seen with spouse present to observe for toleration of current diet (thin liquids) and for trial of puree solids. SLP also reviewed MBS with patient's wife and showed her an image from [facility] yesterday with explanation. Patient tolerated thin liquids via straw sips and bites of puree without overt s/s of aspiration or penetration and with no change in vitals or vocal quality. He did request more water after finishing a cup and would continuously say he was thirsty. Unsure if patient truly thirsty or this is related to some cognitive impairment/confusion.During MBS yesterday, patient was reportedly very lethargic and today he is very alert, so SLP is recommending a slight upgrade in diet consistency from clear liquids (thin liquids) to full liquids (includes apple sauce, etc).    HPI HPI: 86 year old male with a history of hypertension, hyperlipidemia, CHF, paroxysmal atrial fibrillation on Eliquis, carotid stenosis, AAA presented to the ED after a fall at home.  Apparently patient got from his chair felt dizzy and fell hitting his head.  Patient was found to be in acute respiratory failure with hypercapnia, started on BiPAP.  ABG showed PCO2 of 73.7.  Pt evalauted by SLP on 6/10 with appearance of normal swallow. Overnight RN concerned for aspriation given rhonchorous breath sounds after drinking water so MBS ordered.       SLP Plan  Continue with current plan of care       Recommendations  Diet recommendations: Thin liquid (full liquids) Liquids provided via: Cup;Straw Medication Administration: Whole meds with liquid Supervision: Patient able to self feed;Full supervision/cueing for compensatory strategies Compensations:  Slow rate;Small sips/bites Postural Changes and/or Swallow Maneuvers: Seated upright 90 degrees;Upright 30-60 min after meal                Oral Care Recommendations: Oral care BID;Staff/trained caregiver to provide oral care Follow up Recommendations: 24 hour supervision/assistance SLP Visit Diagnosis: Dysphagia, oropharyngeal phase (R13.12) Plan: Continue with current plan of care       [place], MA, [facility]

## 2020-06-07 LAB — BASIC METABOLIC PANEL
Anion gap: 13 (ref 5–15)
BUN: 64 mg/dL — ABNORMAL HIGH (ref 8–23)
CO2: 26 mmol/L (ref 22–32)
Calcium: 8.4 mg/dL — ABNORMAL LOW (ref 8.9–10.3)
Chloride: 102 mmol/L (ref 98–111)
Creatinine, Ser: 1.78 mg/dL — ABNORMAL HIGH (ref 0.61–1.24)
GFR calc Af Amer: 38 mL/min — ABNORMAL LOW (ref >60)
GFR calc non Af Amer: 33 mL/min — ABNORMAL LOW (ref >60)
Glucose, Bld: 212 mg/dL — ABNORMAL HIGH (ref 70–99)
Potassium: 3.3 mmol/L — ABNORMAL LOW (ref 3.5–5.1)
Sodium: 141 mmol/L (ref 135–145)

## 2020-06-07 LAB — CULTURE, BLOOD (ROUTINE X 2)
Culture: NO GROWTH
Culture: NO GROWTH

## 2020-06-07 MED ORDER — PANTOPRAZOLE SODIUM 40 MG PO TBEC
40.0000 mg | DELAYED_RELEASE_TABLET | Freq: Every day | ORAL | Status: DC
  Administered 2020-06-07 – 2020-06-08 (×2): 40 mg via ORAL
  Filled 2020-06-07 (×2): qty 1

## 2020-06-07 MED ORDER — POTASSIUM CHLORIDE CRYS ER 20 MEQ PO TBCR
40.0000 meq | EXTENDED_RELEASE_TABLET | Freq: Every day | ORAL | Status: AC
  Administered 2020-06-07 – 2020-06-08 (×2): 40 meq via ORAL
  Filled 2020-06-07 (×2): qty 2

## 2020-06-07 MED ORDER — METHYLPREDNISOLONE SODIUM SUCC 40 MG IJ SOLR
40.0000 mg | Freq: Two times a day (BID) | INTRAMUSCULAR | Status: DC
  Administered 2020-06-08 – 2020-06-11 (×7): 40 mg via INTRAVENOUS
  Filled 2020-06-07 (×6): qty 1

## 2020-06-07 NOTE — Progress Notes (Signed)
[facility] NOTE  Andrew [name] CZY:[id number] DOB: [birth year] DOA: 06/02/2020 PCP: Patient, No Pcp Per   Brief HPI:   86 year old male with a history of hypertension, hyperlipidemia, CHF, paroxysmal atrial fibrillation on Eliquis, carotid stenosis, AAA presented to the ED after a fall at home.  Apparently patient got from his chair felt dizzy and fell hitting his head.  Patient was found to be in acute respiratory failure with hypercapnia, started on BiPAP.  ABG showed PCO2 of 73.7.   Subjective   Patient seen and examined, denies any complaints.  Denies chest pain or shortness of breath.   Assessment/Plan:     1. Acute hypercapnic respiratory failure-significantly improved, secondary to COPD patient's ABG yesterday morning showed PCO2 of 48.4.  BiPAP has been weaned off.  Continue duo nebs every 6 hours.  Patient started on Solu-Medrol 60 mg IV every 6 hours.  Will wean off Solu-Medrol.  Mucinex 1 tablet p.o. twice daily, flutter valve every 4 hours. 2. Aspiration pneumonia-we will reconsult speech therapy to reevaluate for dysphagia.  Continue IV Unasyn. 3. Acute on chronic systolic CHF-patient takes Lasix at home.  BNP elevated at 1981.3 patient was started on Lasix 40 mg IV every 12 hours, adequate diuresis.  Renal function little worse today.  IV Lasix was held.  Will restart Lasix 40 mg p.o. twice daily from tonight.  Patient takes Lasix 120 mg in a.m. and 80 mg in p.m.   4. Uncontrolled hypertension-Home medications have been restarted, since patient is on BiPAP, oral medications have been held.  Restart home medication including Coreg and hydralazine. 5. Paroxysmal atrial fibrillation on chronic anticoagulation-patient is in normal sinus rhythm, patient started on heparin per pharmacy.  He takes Eliquis at home.  Will restart Eliquis as is off BiPAP.  Heparin was discontinued.  Continue amiodarone 100 mg p.o. daily.  6. Hypokalemia-replace potassium and follow  BMP in a.m. 7. Anemia-patient's hemoglobin has dropped from 14.3-12.9.  Stool guaiac was weakly positive.  Will repeat FOBT.  Is not a candidate for EGD and colonoscopy considering his poor respiratory status.  We will start the tonics 40 mg daily.  Patient is also on Eliquis. 8. CKD stage IV-baseline creatinine around 2.  Today creatinine has improved to 1.78  We will continue to monitor patient's creatinine in the hospital. 9. ? Cellulitis-patient was treated with cephalexin for lower extremity cellulitis, no signs and symptoms of infection.  Looks more likely venous stasis.  Continue to monitor off antibiotics. 10. Hypothyroidism- continue synthroid.       SpO2: 90 % O2 Flow Rate (L/min): 3 L/min FiO2 (%): 60 %   COVID-19 Labs  No results for input(s): DDIMER, FERRITIN, LDH, CRP in the last 72 hours.  Lab Results  Component Value Date   [place] NEGATIVE 06/02/2020     CBG: Recent Labs  Lab 06/02/20 1127  GLUCAP 150*    CBC: Recent Labs  Lab 06/02/20 1121 06/02/20 1312 06/02/20 1556 06/03/20 0604 06/04/20 0427 06/05/20 0500 06/06/20 0301  WBC 6.9  --   --  12.4* 14.2* 12.0* 8.1  NEUTROABS 4.5  --   --   --   --   --   --   HGB 12.9*   < > 14.3 14.9 15.2 13.2 12.9*  HCT 39.4   < > 42.0 45.5 46.7 40.9 39.6  MCV 101.5*  --   --  100.2* 101.7* 102.8* 102.1*  PLT 136*  --   --  181 172  135* 147*   < > = values in this interval not displayed.    Basic Metabolic Panel: Recent Labs  Lab 06/03/20 0604 06/04/20 0427 06/05/20 0500 06/06/20 0301 06/07/20 0516  NA 144 148* 146* 145 141  K 3.7 3.1* 3.2* 3.6 3.3*  CL 104 107 105 106 102  CO2 25 26 26 24 26   GLUCOSE 162* 146* 130* 210* 212*  BUN 36* 36* 50* 60* 64*  CREATININE 1.71* 1.64* 1.76* 1.83* 1.78*  CALCIUM 9.4 9.2 8.8* 8.7* 8.4*     Liver Function Tests: Recent Labs  Lab 06/02/20 1121  AST 47*  ALT 30  ALKPHOS 65  BILITOT 1.0  PROT 6.9  ALBUMIN 3.6        DVT prophylaxis:  Eliquis  Code Status: Full code  Family Communication: No family at bedside    Status is: Inpatient  Dispo: The patient is from: Home              Anticipated d/c is to: Home              Anticipated d/c date is: 06/08/2020              Patient currently not medically stable  Barrier to discharge-patient is acute respiratory failure with hypercapnia, receiving IV solumedrol        Scheduled medications:  . amiodarone  100 mg Oral Daily  . apixaban  2.5 mg Oral BID  . atorvastatin  80 mg Oral Daily  . carvedilol  12.5 mg Oral BID  . chlorhexidine  15 mL Mouth Rinse BID  . cilostazol  100 mg Oral BID  . ezetimibe  10 mg Oral Daily  . FLUoxetine  20 mg Oral Daily  . furosemide  40 mg Oral BID  . guaiFENesin  1,200 mg Oral BID  . hydrALAZINE  50 mg Oral TID  . isosorbide mononitrate  30 mg Oral Daily  . levothyroxine  100 mcg Oral Q0600  . mouth rinse  15 mL Mouth Rinse q12n4p  . methylPREDNISolone (SOLU-MEDROL) injection  40 mg Intravenous Q6H  . sodium chloride flush  3 mL Intravenous Q12H    Consultants:    Procedures:    Antibiotics:   Anti-infectives (From admission, onward)   Start     Dose/Rate Route Frequency Ordered Stop   06/05/20 1300  Ampicillin-Sulbactam (UNASYN) 3 g in sodium chloride 0.9 % 100 mL IVPB     Discontinue     3 g 200 mL/hr over 30 Minutes Intravenous Every 12 hours 06/05/20 0909 06/12/20 0059   06/05/20 0100  Ampicillin-Sulbactam (UNASYN) 3 g in sodium chloride 0.9 % 100 mL IVPB  Status:  Discontinued        3 g 200 mL/hr over 30 Minutes Intravenous Every 8 hours 06/05/20 0030 06/05/20 0909       Objective   Vitals:   06/06/20 2109 06/07/20 0020 06/07/20 0351 06/07/20 0412  BP: (!) 147/77 133/61 (!) 144/77 (!) 159/79  Pulse: 86 83 88 86  Resp: (!) 24 18 20  (!) 22  Temp:   98.4 F (36.9 C)   TempSrc:   Oral   SpO2: 91% 93% (!) 89% 90%  Weight:      Height:        Intake/Output Summary (Last 24 hours) at 06/07/2020  1206 Last data filed at 06/07/2020 1100 Gross per 24 hour  Intake 480 ml  Output 1 ml  Net 479 ml    06/11 1901 - 06/13  0700 In: 1475.7 [P.O.:1175] Out: 702 [Urine:701]  Filed Weights   06/02/20 2100 06/03/20 0125 06/04/20 0446  Weight: 84.8 kg 81.8 kg 82.1 kg    Physical Examination:   General-appears in no acute distress Heart-S1-S2, regular, no murmur auscultated Lungs-clear to auscultation bilaterally, no wheezing or crackles auscultated Abdomen-soft, nontender, no organomegaly Extremities-no edema in the lower extremities Neuro-alert, oriented x3, no focal deficit noted  Data Reviewed:   Recent Results (from the past 240 hour(s))  SARS Coronavirus 2 by RT PCR (hospital order, performed in [place] hospital lab) Nasopharyngeal Nasopharyngeal Swab     Status: None   Collection Time: 06/02/20 12:56 PM   Specimen: Nasopharyngeal Swab  Result Value Ref Range Status   SARS Coronavirus 2 NEGATIVE NEGATIVE Final    Comment: (NOTE) SARS-CoV-2 target nucleic acids are NOT DETECTED. The SARS-CoV-2 RNA is generally detectable in upper and lower respiratory specimens during the acute phase of infection. The lowest concentration of SARS-CoV-2 viral copies this assay can detect is 250 copies / mL. A negative result does not preclude SARS-CoV-2 infection and should not be used as the sole basis for treatment or other patient management decisions.  A negative result may occur with improper specimen collection / handling, submission of specimen other than nasopharyngeal swab, presence of viral mutation(s) within the areas targeted by this assay, and inadequate number of viral copies (<250 copies / mL). A negative result must be combined with clinical observations, patient history, and epidemiological information. Fact Sheet for Patients:   StrictlyIdeas.no Fact Sheet for Healthcare Providers: BankingDealers.co.za This test is not  yet approved or cleared  by the [place] FDA and has been authorized for detection and/or diagnosis of SARS-CoV-2 by FDA under an Emergency Use Authorization (EUA).  This EUA will remain in effect (meaning this test can be used) for the duration of the COVID-19 declaration under Section 564(b)(1) of the Act, 21 U.S.C. section 360bbb-3(b)(1), unless the authorization is terminated or revoked sooner. Performed at [facility], [place] [address], [place], [place] [address]   Culture, blood (routine x 2)     Status: None   Collection Time: 06/02/20 10:04 PM   Specimen: BLOOD LEFT WRIST  Result Value Ref Range Status   Specimen Description BLOOD LEFT WRIST  Final   Special Requests   Final    BOTTLES DRAWN AEROBIC AND ANAEROBIC Blood Culture results may not be optimal due to an inadequate volume of blood received in culture bottles   Culture   Final    NO GROWTH 5 DAYS Performed at [facility], [place] [address] [place], [place] [address]    Report Status 06/07/2020 FINAL  Final  Culture, blood (routine x 2)     Status: None   Collection Time: 06/02/20 10:04 PM   Specimen: BLOOD  Result Value Ref Range Status   Specimen Description BLOOD  Final   Special Requests   Final    LEFT KNUCKLE BOTTLES DRAWN AEROBIC AND ANAEROBIC Blood Culture results may not be optimal due to an inadequate volume of blood received in culture bottles   Culture   Final    NO GROWTH 5 DAYS Performed at [facility], [place] [address] [place], [place] [address]    Report Status 06/07/2020 FINAL  Final  MRSA PCR Screening     Status: None   Collection Time: 06/03/20 10:57 PM   Specimen: Nasal Mucosa; Nasopharyngeal  Result Value Ref Range Status   MRSA by  PCR NEGATIVE NEGATIVE Final    Comment:        The GeneXpert MRSA Assay (FDA approved for NASAL specimens only), is one component of a comprehensive MRSA colonization surveillance program. It is not intended to diagnose MRSA infection  nor to guide or monitor treatment for MRSA infections. Performed at [facility], [place] [address] [place], [place] [address]      BNP (last 3 results) Recent Labs    06/03/20 0604  BNP 1,981.3*     Oswald Hillock   [facility] If 7PM-7AM, please contact night-coverage at [email or web address], Office  [phone]   06/07/2020, 12:06 PM  LOS: 5 days

## 2020-06-07 NOTE — Evaluation (Signed)
Physical Therapy Evaluation Patient Details Name: Andrew [name] MRN: [id number] DOB: 03/20/1928 Today's Date: 06/07/2020   History of Present Illness  86 year old male with a history of hypertension, hyperlipidemia, CHF, paroxysmal atrial fibrillation on Eliquis, carotid stenosis, AAA presented to the ED after a fall at home.  Apparently patient got from his chair felt dizzy and fell hitting his head.  Patient was found to be in acute respiratory failure with hypercapnia, started on BiPAP.  ABG showed PCO2 of 73.7.  Clinical Impression   Pt admitted with above diagnosis. Per chart review and patient, he comes from home, where he lives with his wife in a multi-level house; he does not need to go upstairs; Reports at baseline he uses a cane for safety with amb; Noted some confusion earlier, and will need to confirm his home environment, available assist, and PLOF; Presents to PT with increased oxygen need, decr functional capacity, generalized weakness, functional dependencies;  Pt currently with functional limitations due to the deficits listed below (see PT Problem List). Pt will benefit from skilled PT to increase their independence and safety with mobility to allow discharge to the venue listed below.       Follow Up Recommendations SNF    Equipment Recommendations  Rolling walker with 5" wheels;3in1 (PT)    Recommendations for Other Services OT consult (will order per protocol)     Precautions / Restrictions Precautions Precautions: Fall      Mobility  Bed Mobility Overal bed mobility: Needs Assistance Bed Mobility: Supine to Sit     Supine to sit: Mod assist     General bed mobility comments: Heavy mod assist to pull to sit  Transfers Overall transfer level: Needs assistance Equipment used: 1 person hand held assist (and bilateral support at gait belt) Transfers: Sit to/from [facility] Sit to Stand: Mod assist Stand pivot transfers: Mod assist        General transfer comment: Heavy mod assist to power up and steady; then required heavy mod assist to weight shift and turn to chair; unable to take appreciable steps  Ambulation/Gait                Stairs            Wheelchair Mobility    Modified Rankin (Stroke Patients Only)       Balance Overall balance assessment: Needs assistance Sitting-balance support: Bilateral upper extremity supported Sitting balance-Leahy Scale: Fair       Standing balance-Leahy Scale: Zero                               Pertinent Vitals/Pain Pain Assessment: No/denies pain    Home Living Family/patient expects to be discharged to:: Private residence Living Arrangements: Spouse/significant other Available Help at Discharge: Family;Available 24 hours/day Type of Home: House Home Access: Level entry (needs to be confirmed)     Home Layout: Multi-level;Able to live on main level with bedroom/bathroom   Additional Comments: will need to confirm above information, and obtain more information re: bathroom setup    Prior Function Level of Independence: Independent with assistive device(s)         Comments: REports he typically uses a cane to walk; will have to confirm PLOF     Hand Dominance        Extremity/Trunk Assessment   Upper Extremity Assessment Upper Extremity Assessment: Generalized weakness    Lower Extremity Assessment Lower Extremity Assessment:  Generalized weakness (with noted stiffness in knees)       Communication   Communication: HOH  Cognition Arousal/Alertness: Awake/alert Behavior During Therapy: WFL for tasks assessed/performed;Restless;Impulsive Overall Cognitive Status: No family/caregiver present to determine baseline cognitive functioning Area of Impairment: Safety/judgement;Awareness;Problem solving                         Safety/Judgement: Decreased awareness of safety;Decreased awareness of deficits Awareness:  Intellectual Problem Solving: Difficulty sequencing;Requires verbal cues;Requires tactile cues General Comments: Pleasant and wanting OOB; was taking gown off and moving to EOB when PT arrived      General Comments General comments (skin integrity, edema, etc.): VSS on 5L supplemental O2    Exercises     Assessment/Plan    PT Assessment Patient needs continued PT services  PT Problem List Decreased strength;Decreased range of motion;Decreased activity tolerance;Decreased balance;Decreased mobility;Decreased coordination;Decreased cognition;Decreased knowledge of use of DME;Decreased safety awareness;Decreased knowledge of precautions;Cardiopulmonary status limiting activity       PT Treatment Interventions DME instruction;Gait training;Functional mobility training;Therapeutic activities;Therapeutic exercise;Balance training;Neuromuscular re-education;Cognitive remediation;Patient/family education    PT Goals (Current goals can be found in the Care Plan section)  Acute Rehab PT Goals Patient Stated Goal: Did not specifically state, but agreeable to OOB PT Goal Formulation: Patient unable to participate in goal setting Time For Goal Achievement: 06/21/20 Potential to Achieve Goals: Good    Frequency Min 2X/week   Barriers to discharge        Co-evaluation               AM-PAC PT "6 Clicks" Mobility  Outcome Measure Help needed turning from your back to your side while in a flat bed without using bedrails?: A Lot Help needed moving from lying on your back to sitting on the side of a flat bed without using bedrails?: A Lot Help needed moving to and from a bed to a chair (including a wheelchair)?: A Lot Help needed standing up from a chair using your arms (e.g., wheelchair or bedside chair)?: A Lot Help needed to walk in hospital room?: A Lot Help needed climbing 3-5 steps with a railing? : Total 6 Click Score: 11    End of Session Equipment Utilized During Treatment:  Gait belt Activity Tolerance: Patient tolerated treatment well Patient left: in chair;with call bell/phone within reach;with chair alarm set Nurse Communication: Mobility status PT Visit Diagnosis: Unsteadiness on feet (R26.81);Other abnormalities of gait and mobility (R26.89);Muscle weakness (generalized) (M62.81)    Time: 7017-7939 PT Time Calculation (min) (ACUTE ONLY): 29 min   Charges:   PT Evaluation $PT Eval Moderate Complexity: 1 Mod PT Treatments $Therapeutic Activity: 8-22 mins        Roney Marion, PT  Acute Rehabilitation Services Pager [phone] Office [place] 06/07/2020, 7:24 PM

## 2020-06-07 NOTE — Progress Notes (Signed)
Patient sitting in chair in no acute distress. Patient set up with his meal tray and is feeding himself.  Patient is calm and cooperative since he's been in the chair

## 2020-06-07 NOTE — Progress Notes (Signed)
Patient up in chair. PT transferred patient.  Table tray in front of patient so he can reach his water and his meals.

## 2020-06-07 NOTE — Progress Notes (Signed)
Patient assisted back to bed. Patient ekg leads changed and condom cath changed

## 2020-06-08 LAB — BASIC METABOLIC PANEL
Anion gap: 9 (ref 5–15)
BUN: 75 mg/dL — ABNORMAL HIGH (ref 8–23)
CO2: 26 mmol/L (ref 22–32)
Calcium: 8.3 mg/dL — ABNORMAL LOW (ref 8.9–10.3)
Chloride: 105 mmol/L (ref 98–111)
Creatinine, Ser: 1.96 mg/dL — ABNORMAL HIGH (ref 0.61–1.24)
GFR calc Af Amer: 34 mL/min — ABNORMAL LOW (ref >60)
GFR calc non Af Amer: 29 mL/min — ABNORMAL LOW (ref >60)
Glucose, Bld: 245 mg/dL — ABNORMAL HIGH (ref 70–99)
Potassium: 3.8 mmol/L (ref 3.5–5.1)
Sodium: 140 mmol/L (ref 135–145)

## 2020-06-08 LAB — CBC
HCT: 33.3 % — ABNORMAL LOW (ref 39.0–52.0)
Hemoglobin: 11.3 g/dL — ABNORMAL LOW (ref 13.0–17.0)
MCH: 34 pg (ref 26.0–34.0)
MCHC: 33.9 g/dL (ref 30.0–36.0)
MCV: 100.3 fL — ABNORMAL HIGH (ref 80.0–100.0)
Platelets: 142 10*3/uL — ABNORMAL LOW (ref 150–400)
RBC: 3.32 MIL/uL — ABNORMAL LOW (ref 4.22–5.81)
RDW: 12.4 % (ref 11.5–15.5)
WBC: 9.2 10*3/uL (ref 4.0–10.5)
nRBC: 0 % (ref 0.0–0.2)

## 2020-06-08 NOTE — Progress Notes (Signed)
Occupational Therapy Evaluation Patient Details Name: Andrew [name] MRN: [id number] DOB: 1928-05-21 Today's Date: 06/08/2020    History of Present Illness 86 year old male with a history of hypertension, hyperlipidemia, CHF, paroxysmal atrial fibrillation on Eliquis, carotid stenosis, AAA presented to the ED after a fall at home.  Apparently patient got from his chair felt dizzy and fell hitting his head.  Patient was found to be in acute respiratory failure with hypercapnia, started on BiPAP.  ABG showed PCO2 of 73.7.   Clinical Impression   Patient lives with wife in a multi level home. He is independent with devices at baseline and uses a cane typically for mobility, though recently has started using a walker.  Today patient presenting with decreased cognition.  Patient was oriented and able to answer higher cognitive level questions, though was repeating himself often and had slow processing.  Patient became agitated when therapist repeatedly tried to get him to sit EOB.  He adamantly refused stating he knows better and will fall if he does so.  Educated patient on importance of mobility.  Patient requiring set up for eating and UB ADLs while at bed level.  Will continue to follow with OT acutely to address the deficits listed below.      Follow Up Recommendations  SNF;Supervision - Intermittent    Equipment Recommendations  Other (comment) (defer to next venue)    Recommendations for Other Services       Precautions / Restrictions Precautions Precautions: Fall Restrictions Weight Bearing Restrictions: No      Mobility Bed Mobility               General bed mobility comments: refused  Transfers                 General transfer comment: refused    Balance                                           ADL either performed or assessed with clinical judgement   ADL Overall ADL's : Needs assistance/impaired Eating/Feeding: Set up;Bed  level Eating/Feeding Details (indicate cue type and reason): Needed assistance opening containers Grooming: Set up;Bed level   Upper Body Bathing: Set up;Bed level   Lower Body Bathing: Moderate assistance   Upper Body Dressing : Set up;Bed level   Lower Body Dressing: Moderate assistance                 General ADL Comments: Patient refusing OOB and EOB stating he knows better and does not want to fall.  Multiple attempts to educate patient on importance of moving OOB and safety of doing it with therapy though he adamently refued     Vision         Perception     Praxis      Pertinent Vitals/Pain       Hand Dominance     Extremity/Trunk Assessment Upper Extremity Assessment Upper Extremity Assessment: Generalized weakness           Communication Communication Communication: HOH   Cognition Arousal/Alertness: Awake/alert Behavior During Therapy: WFL for tasks assessed/performed;Restless Overall Cognitive Status: No family/caregiver present to determine baseline cognitive functioning Area of Impairment: Safety/judgement;Awareness;Problem solving;Following commands;Memory                     Memory: Decreased short-term memory Following Commands: Follows one step commands inconsistently  Safety/Judgement: Decreased awareness of safety;Decreased awareness of deficits Awareness: Emergent Problem Solving: Slow processing;Decreased initiation;Difficulty sequencing;Requires verbal cues;Requires tactile cues General Comments: Often repeating himself, recalled 2/3 STM words.  Patient convinced he will fall if he gets to EOB.   General Comments  Patient on 3L O2 and SpO2 99 at rest    Exercises     Shoulder Instructions      Home Living Family/patient expects to be discharged to:: Private residence Living Arrangements: Spouse/significant other Available Help at Discharge: Family;Available 24 hours/day Type of Home: House Home Access: Level entry      Home Layout: Multi-level;Able to live on main level with bedroom/bathroom     Bathroom Shower/Tub: Walk-in shower         Home Equipment: Environmental consultant - 2 wheels;Shower seat - built in;Cane - single point          Prior Functioning/Environment Level of Independence: Independent with assistive device(s)        Comments: REports he typically uses a cane to walk, recently using walker more        OT Problem List: Decreased strength;Decreased activity tolerance;Impaired balance (sitting and/or standing);Decreased coordination;Decreased cognition;Decreased safety awareness      OT Treatment/Interventions: Self-care/ADL training;Therapeutic exercise;Energy conservation;Therapeutic activities;Cognitive remediation/compensation;Patient/family education;Balance training    OT Goals(Current goals can be found in the care plan section) Acute Rehab OT Goals Patient Stated Goal: To get well before going home OT Goal Formulation: With patient Time For Goal Achievement: 06/22/20 Potential to Achieve Goals: Good  OT Frequency: Min 2X/week   Barriers to D/C:            Co-evaluation              AM-PAC OT "6 Clicks" Daily Activity     Outcome Measure Help from another person eating meals?: A Little Help from another person taking care of personal grooming?: A Little Help from another person toileting, which includes using toliet, bedpan, or urinal?: A Lot Help from another person bathing (including washing, rinsing, drying)?: A Lot Help from another person to put on and taking off regular upper body clothing?: A Little Help from another person to put on and taking off regular lower body clothing?: A Lot 6 Click Score: 15   End of Session Equipment Utilized During Treatment: Oxygen Nurse Communication: Mobility status  Activity Tolerance: Patient tolerated treatment well Patient left: in bed;with call bell/phone within reach;with bed alarm set  OT Visit Diagnosis: History  of falling (Z91.81);Muscle weakness (generalized) (M62.81);Other symptoms and signs involving cognitive function                Time: 3704-8889 OT Time Calculation (min): 25 min Charges:  OT General Charges $OT Visit: 1 Visit OT Evaluation $OT Eval Moderate Complexity: 1 Mod OT Treatments $Self Care/Home Management : 8-22 mins  August Luz, OTR/L   [place]ll[place] Bob 06/08/2020, 10:42 AM

## 2020-06-08 NOTE — Progress Notes (Signed)
  Speech Language Pathology Treatment: Dysphagia  Patient Details Name: Andrew [name] MRN: [id number] DOB: 1928-12-25 Today's Date: 06/08/2020 Time: 1335-1400 SLP Time Calculation (min) (ACUTE ONLY): 25 min  Assessment / Plan / Recommendation Clinical Impression  Pt seen at bedside to assess diet tolerance and continue education. Pt was resting in bed, awake and alert. No family present. Pt accepted trials of puree texture and thin liquid without oral residue or leakage, and without overt s/s aspiration. Will advance to pureed solids and continue thin liquids. SLP will continue to follow to assess appropriateness for advanced consistencies. Safe swallow precautions updated at [facility]. RN and MD informed.   HPI HPI: 86 year old male with a history of hypertension, hyperlipidemia, CHF, paroxysmal atrial fibrillation on Eliquis, carotid stenosis, AAA presented to the ED after a fall at home.  Apparently patient got from his chair felt dizzy and fell hitting his head.  Patient was found to be in acute respiratory failure with hypercapnia, started on BiPAP.  ABG showed PCO2 of 73.7.  Pt evalauted by SLP on 6/10 with appearance of normal swallow. Overnight RN concerned for aspriation given rhonchorous breath sounds after drinking water so MBS ordered.       SLP Plan  Continue with current plan of care       Recommendations  Diet recommendations: Dysphagia 1 (puree);Thin liquid Liquids provided via: Cup;Straw Medication Administration: Whole meds with liquid Supervision: Patient able to self feed;Full supervision/cueing for compensatory strategies Compensations: Slow rate;Small sips/bites Postural Changes and/or Swallow Maneuvers: Seated upright 90 degrees;Upright 30-60 min after meal                Oral Care Recommendations: Oral care BID;Staff/trained caregiver to provide oral care Follow up Recommendations: 24 hour supervision/assistance SLP Visit Diagnosis: Dysphagia, oropharyngeal  phase (R13.12) Plan: Continue with current plan of care       GO               Akil Hoos B. Quentin Ore, [facility], [place] Speech Language Pathologist Office: [phone]  Shonna Chock 06/08/2020, 2:05 PM

## 2020-06-08 NOTE — NC FL2 (Signed)
[place] MEDICAID FL2 LEVEL OF CARE SCREENING TOOL     IDENTIFICATION  Patient Name: Andrew [name] Birthdate: 01-09-1928 Sex: male Admission Date (Current Location): 06/02/2020  [facility] and [place] Number:  Herbalist and Address:  The . [facility], [place] [address], [place], [place] [address]      Provider Number: [id number]  Attending Physician Name and Address:  Oswald Hillock, MD  Relative Name and Phone Number:       Current Level of Care: Hospital Recommended Level of Care: [place] Prior Approval Number:    Date Approved/Denied:   PASRR Number: [id number]  Discharge Plan:  SNF     Current Diagnoses: Patient Active Problem List   Diagnosis Date Noted  . Acute respiratory failure with hypercapnia ([place]) 06/02/2020  . Acute metabolic encephalopathy 12/28/7251  . CKD (chronic kidney disease), stage IV ([place]) 06/02/2020  . PAF (paroxysmal atrial fibrillation) ([place]) 06/02/2020  . Hypothyroidism 06/02/2020  . Chronic anticoagulation 06/02/2020    Orientation RESPIRATION BLADDER Height & Weight        O2 (nasal canula) Incontinent Weight: 194 lb 0.1 oz (88 kg) Height:  5\' 8"  (172.7 cm)  BEHAVIORAL SYMPTOMS/MOOD NEUROLOGICAL BOWEL NUTRITION STATUS      Continent Diet (see discharge summary)  AMBULATORY STATUS COMMUNICATION OF NEEDS Skin   Extensive Assist Verbally Skin abrasions, Bruising, Other (Comment) (cracking on legs; wound on head with gauze dressing (PRN changes); generalized ecchymosis; MASD on buttocks)                       Personal Care Assistance Level of Assistance  Bathing, Dressing, Feeding Bathing Assistance: Maximum assistance Feeding assistance: Independent Dressing Assistance: Maximum assistance     Functional Limitations Info  Sight, Hearing, Speech Sight Info: Adequate Hearing Info: Adequate Speech Info: Adequate    SPECIAL CARE FACTORS FREQUENCY  PT (By licensed PT), OT (By licensed  OT)     PT Frequency: 5x week OT Frequency: 5x week            Contractures Contractures Info: Not present    Additional Factors Info  Code Status, Allergies, Psychotropic Code Status Info: Full Code Allergies Info: No Known Allergies Psychotropic Info: FLUoxetine (PROZAC) capsule 20 mg daily PO         Current Medications (06/08/2020):  This is the current hospital active medication list Current Facility-Administered Medications  Medication Dose Route Frequency Provider Last Rate Last Admin  . 0.9 %  sodium chloride infusion   Intravenous PRN Oswald Hillock, MD   Stopped at 06/05/20 0321  . amiodarone (PACERONE) tablet 100 mg  100 mg Oral Daily Tamala Julian, Rondell A, MD   100 mg at 06/08/20 0955  . Ampicillin-Sulbactam (UNASYN) 3 g in sodium chloride 0.9 % 100 mL IVPB  3 g Intravenous Q12H Karren Cobble, RPH 200 mL/hr at 06/08/20 0115 3 g at 06/08/20 0115  . apixaban (ELIQUIS) tablet 2.5 mg  2.5 mg Oral BID Oswald Hillock, MD   2.5 mg at 06/08/20 0954  . atorvastatin (LIPITOR) tablet 80 mg  80 mg Oral Daily Fuller Plan A, MD   80 mg at 06/08/20 0954  . carvedilol (COREG) tablet 12.5 mg  12.5 mg Oral BID Oswald Hillock, MD   12.5 mg at 06/08/20 0955  . chlorhexidine (PERIDEX) 0.12 % solution 15 mL  15 mL Mouth Rinse BID Oswald Hillock, MD   15 mL at 06/08/20 0956  .  cilostazol (PLETAL) tablet 100 mg  100 mg Oral BID Fuller Plan A, MD   100 mg at 06/08/20 0955  . ezetimibe (ZETIA) tablet 10 mg  10 mg Oral Daily Fuller Plan A, MD   10 mg at 06/08/20 0955  . FLUoxetine (PROZAC) capsule 20 mg  20 mg Oral Daily Tamala Julian, Rondell A, MD   20 mg at 06/08/20 0955  . furosemide (LASIX) tablet 40 mg  40 mg Oral BID Oswald Hillock, MD   40 mg at 06/08/20 0956  . guaiFENesin (MUCINEX) 12 hr tablet 1,200 mg  1,200 mg Oral BID Oswald Hillock, MD   1,200 mg at 06/08/20 0954  . hydrALAZINE (APRESOLINE) injection 10 mg  10 mg Intravenous Q4H PRN Fuller Plan A, MD   10 mg at 06/05/20 2225  .  hydrALAZINE (APRESOLINE) tablet 50 mg  50 mg Oral TID Oswald Hillock, MD   50 mg at 06/08/20 0955  . HYDROcodone-acetaminophen (NORCO/VICODIN) 5-325 MG per tablet 1 tablet  1 tablet Oral Q4H PRN Oswald Hillock, MD   1 tablet at 06/06/20 2110  . ipratropium-albuterol (DUONEB) 0.5-2.5 (3) MG/3ML nebulizer solution 3 mL  3 mL Nebulization Q6H PRN Oswald Hillock, MD   3 mL at 06/04/20 2136  . isosorbide mononitrate (IMDUR) 24 hr tablet 30 mg  30 mg Oral Daily Tamala Julian, Rondell A, MD   30 mg at 06/08/20 0956  . levothyroxine (SYNTHROID) tablet 100 mcg  100 mcg Oral Q0600 Oswald Hillock, MD   100 mcg at 06/08/20 0531  . MEDLINE mouth rinse  15 mL Mouth Rinse q12n4p Oswald Hillock, MD   15 mL at 06/07/20 1608  . methylPREDNISolone sodium succinate (SOLU-MEDROL) 40 mg/mL injection 40 mg  40 mg Intravenous Q12H Oswald Hillock, MD   40 mg at 06/08/20 0530  . ondansetron (ZOFRAN) tablet 4 mg  4 mg Oral Q6H PRN Fuller Plan A, MD       Or  . ondansetron (ZOFRAN) injection 4 mg  4 mg Intravenous Q6H PRN Smith, Rondell A, MD      . pantoprazole (PROTONIX) EC tablet 40 mg  40 mg Oral Q1200 Oswald Hillock, MD   40 mg at 06/07/20 1606  . sodium chloride flush (NS) 0.9 % injection 3 mL  3 mL Intravenous Q12H Smith, Rondell A, MD   3 mL at 06/08/20 0957  . temazepam (RESTORIL) capsule 30 mg  30 mg Oral QHS PRN Norval Morton, MD   30 mg at 06/07/20 0908     Discharge Medications: Please see discharge summary for a list of discharge medications.  Relevant Imaging Results:  Relevant Lab Results:   Additional Information SS# [id number]  Alexander Mt, LCSW

## 2020-06-08 NOTE — Social Work (Signed)
HIPAA compliant message left for pt wife Vinnie Level at [phone], unable to leave message for wife on home voicemail [phone]) as mailbox was full.  Westley Hummer, MSW, [place] [employer]

## 2020-06-08 NOTE — Plan of Care (Signed)
Patient A&O x3-4. VSS. Pt on 3 L HFNC of oxygen sating above 93%. Lungs sounds regular and diminished. Free from falls. Repositioned in bed. Pt tolerating fluids and meals.  Pt voiding adequately during shift.DVT prophylactic: Eliquis. Pain managed. Bed wheels locked. Phone and call bell within reach. Pt is resting, no distress. POC reviewed with wife.  Problem: Education: Goal: Knowledge of General Education information will improve Description: Including pain rating scale, medication(s)/side effects and non-pharmacologic comfort measures Outcome: Progressing   Problem: Health Behavior/Discharge Planning: Goal: Ability to manage health-related needs will improve Outcome: Progressing   Problem: Clinical Measurements: Goal: Ability to maintain clinical measurements within normal limits will improve Outcome: Progressing Goal: Will remain free from infection Outcome: Progressing Goal: Diagnostic test results will improve Outcome: Progressing Goal: Respiratory complications will improve Outcome: Progressing Goal: Cardiovascular complication will be avoided Outcome: Progressing   Problem: Activity: Goal: Risk for activity intolerance will decrease Outcome: Progressing   Problem: Nutrition: Goal: Adequate nutrition will be maintained Outcome: Progressing   Problem: Coping: Goal: Level of anxiety will decrease Outcome: Progressing   Problem: Elimination: Goal: Will not experience complications related to bowel motility Outcome: Progressing Goal: Will not experience complications related to urinary retention Outcome: Progressing   Problem: Pain Managment: Goal: General experience of comfort will improve Outcome: Progressing   Problem: Safety: Goal: Ability to remain free from injury will improve Outcome: Progressing   Problem: Skin Integrity: Goal: Risk for impaired skin integrity will decrease Outcome: Progressing   Problem: Education: Goal: Ability to demonstrate  management of disease process will improve Outcome: Progressing Goal: Ability to verbalize understanding of medication therapies will improve Outcome: Progressing Goal: Individualized Educational Video(s) Outcome: Progressing   Problem: Activity: Goal: Capacity to carry out activities will improve Outcome: Progressing   Problem: Cardiac: Goal: Ability to achieve and maintain adequate cardiopulmonary perfusion will improve Outcome: Progressing   Problem: Education: Goal: Knowledge of disease or condition will improve Outcome: Progressing Goal: Understanding of medication regimen will improve Outcome: Progressing Goal: Individualized Educational Video(s) Outcome: Progressing   Problem: Activity: Goal: Ability to tolerate increased activity will improve Outcome: Progressing   Problem: Cardiac: Goal: Ability to achieve and maintain adequate cardiopulmonary perfusion will improve Outcome: Progressing   Problem: Health Behavior/Discharge Planning: Goal: Ability to safely manage health-related needs after discharge will improve Outcome: Progressing

## 2020-06-08 NOTE — Progress Notes (Signed)
[place] NOTE  Andrew [name] KVQ:[id number] DOB: Jan 29, 1928 DOA: 06/02/2020 PCP: Patient, No Pcp Per   Brief HPI:   86 year old male with a history of hypertension, hyperlipidemia, CHF, paroxysmal atrial fibrillation on Eliquis, carotid stenosis, AAA presented to the ED after a fall at home.  Apparently patient got from his chair felt dizzy and fell hitting his head.  Patient was found to be in acute respiratory failure with hypercapnia, started on BiPAP.  ABG showed PCO2 of 73.7.   Subjective   Patient seen and examined, denies shortness of breath.   Assessment/Plan:     1. Acute hypercapnic respiratory failure-significantly improved, secondary to COPD patient's ABG yesterday morning showed PCO2 of 48.4.  BiPAP has been weaned off.  Continue duo nebs every 6 hours.  Patient started on Solu-Medrol 60 mg IV every 6 hours.  Solu-Medrol being weaned off.  Change to 60 mg IV every 12 hours.   Mucinex 1 tablet p.o. twice daily, flutter valve every 4 hours. 2. Aspiration pneumonia-speech therapy was reconsulted, patient started on dysphagia 1 diet.  Continue IV Unasyn.  We will switch to p.o. Augmentin at the time of discharge 3. Acute on chronic systolic CHF-patient takes Lasix at home.  BNP elevated at 1981.3 patient was started on Lasix 40 mg IV every 12 hours, adequate diuresis.  Renal function little worse today.  IV Lasix was held.  Lasix restarted at lower dose of 40 mg twice a day.   Patient takes Lasix 120 mg in a.m. and 80 mg in p.m.   4. Uncontrolled hypertension-Home medications have been restarted, since patient is on BiPAP, oral medications have been held.  Restart home medication including Coreg and hydralazine. 5. Paroxysmal atrial fibrillation on chronic anticoagulation-patient is in normal sinus rhythm, patient started on heparin per pharmacy.  He takes Eliquis at home.  Will restart Eliquis as is off BiPAP.  Heparin was discontinued.  Continue amiodarone  100 mg p.o. daily.  6. Hypokalemia-replete 7. Anemia-patient's hemoglobin has dropped from 14.3-12.9.  Stool guaiac was weakly positive.  Will repeat FOBT.  Is not a candidate for EGD and colonoscopy considering his poor respiratory status.  Continue Protonix 40 mg daily.Will hold Eliquis.  We will also hold Pletal.  Will consult GI for further recommendations. 8. CKD stage IV-baseline creatinine around 2.  Today creatinine has improved to 1.78  We will continue to monitor patient's creatinine in the hospital. 9. ? Cellulitis-patient was treated with cephalexin for lower extremity cellulitis, no signs and symptoms of infection.  Looks more likely venous stasis.  Continue to monitor off antibiotics. 10. Hypothyroidism- continue synthroid.       SpO2: 95 % O2 Flow Rate (L/min): 3 L/min FiO2 (%): 60 %   COVID-19 Labs  No results for input(s): DDIMER, FERRITIN, LDH, CRP in the last 72 hours.  Lab Results  Component Value Date   Liberal NEGATIVE 06/02/2020     CBG: Recent Labs  Lab 06/02/20 1127  GLUCAP 150*    CBC: Recent Labs  Lab 06/02/20 1121 06/02/20 1312 06/03/20 0604 06/04/20 0427 06/05/20 0500 06/06/20 0301 06/08/20 0312  WBC 6.9   < > 12.4* 14.2* 12.0* 8.1 9.2  NEUTROABS 4.5  --   --   --   --   --   --   HGB 12.9*   < > 14.9 15.2 13.2 12.9* 11.3*  HCT 39.4   < > 45.5 46.7 40.9 39.6 33.3*  MCV 101.5*   < >  100.2* 101.7* 102.8* 102.1* 100.3*  PLT 136*   < > 181 172 135* 147* 142*   < > = values in this interval not displayed.    Basic Metabolic Panel: Recent Labs  Lab 06/04/20 0427 06/05/20 0500 06/06/20 0301 06/07/20 0516 06/08/20 0312  NA 148* 146* 145 141 140  K 3.1* 3.2* 3.6 3.3* 3.8  CL 107 105 106 102 105  CO2 26 26 24 26 26   GLUCOSE 146* 130* 210* 212* 245*  BUN 36* 50* 60* 64* 75*  CREATININE 1.64* 1.76* 1.83* 1.78* 1.96*  CALCIUM 9.2 8.8* 8.7* 8.4* 8.3*     Liver Function Tests: Recent Labs  Lab 06/02/20 1121  AST 47*  ALT 30   ALKPHOS 65  BILITOT 1.0  PROT 6.9  ALBUMIN 3.6        DVT prophylaxis: Eliquis  Code Status: Full code  Family Communication: No family at bedside    Status is: Inpatient  Dispo: The patient is from: Home              Anticipated d/c is to: Home              Anticipated d/c date is: 06/10/2020              Patient currently not medically stable  Barrier to discharge-patient is acute respiratory failure with hypercapnia, receiving IV solumedrol        Scheduled medications:  . amiodarone  100 mg Oral Daily  . apixaban  2.5 mg Oral BID  . atorvastatin  80 mg Oral Daily  . carvedilol  12.5 mg Oral BID  . chlorhexidine  15 mL Mouth Rinse BID  . cilostazol  100 mg Oral BID  . ezetimibe  10 mg Oral Daily  . FLUoxetine  20 mg Oral Daily  . furosemide  40 mg Oral BID  . guaiFENesin  1,200 mg Oral BID  . hydrALAZINE  50 mg Oral TID  . isosorbide mononitrate  30 mg Oral Daily  . levothyroxine  100 mcg Oral Q0600  . mouth rinse  15 mL Mouth Rinse q12n4p  . methylPREDNISolone (SOLU-MEDROL) injection  40 mg Intravenous Q12H  . pantoprazole  40 mg Oral Q1200  . sodium chloride flush  3 mL Intravenous Q12H    Consultants:    Procedures:    Antibiotics:   Anti-infectives (From admission, onward)   Start     Dose/Rate Route Frequency Ordered Stop   06/05/20 1300  Ampicillin-Sulbactam (UNASYN) 3 g in sodium chloride 0.9 % 100 mL IVPB     Discontinue     3 g 200 mL/hr over 30 Minutes Intravenous Every 12 hours 06/05/20 0909 06/12/20 0059   06/05/20 0100  Ampicillin-Sulbactam (UNASYN) 3 g in sodium chloride 0.9 % 100 mL IVPB  Status:  Discontinued        3 g 200 mL/hr over 30 Minutes Intravenous Every 8 hours 06/05/20 0030 06/05/20 0909       Objective   Vitals:   06/08/20 0955 06/08/20 1200 06/08/20 1558 06/08/20 1600  BP: (!) 154/82 119/68 (!) 143/69 (!) 149/69  Pulse: 79 85  81  Resp:  18  20  Temp:  97.8 F (36.6 C)  99.5 F (37.5 C)  TempSrc:   Oral  Axillary  SpO2:  96%  95%  Weight:      Height:        Intake/Output Summary (Last 24 hours) at 06/08/2020 1713 Last data filed at 06/08/2020 1600  Gross per 24 hour  Intake 960 ml  Output 1050 ml  Net -90 ml    06/12 1901 - 06/14 0700 In: 760 [P.O.:660] Out: 1100 [Urine:1100]  Filed Weights   06/03/20 0125 06/04/20 0446 06/08/20 0315  Weight: 81.8 kg 82.1 kg 88 kg    Physical Examination:  General-appears in no acute distress Heart-S1-S2, regular, no murmur auscultated Lungs-clear to auscultation bilaterally, no wheezing or crackles auscultated Abdomen-soft, nontender, no organomegaly Extremities-no edema in the lower extremities Neuro-alert, oriented x3, no focal deficit noted  Data Reviewed:   Recent Results (from the past 240 hour(s))  SARS Coronavirus 2 by RT PCR (hospital order, performed in [facility] hospital lab) Nasopharyngeal Nasopharyngeal Swab     Status: None   Collection Time: 06/02/20 12:56 PM   Specimen: Nasopharyngeal Swab  Result Value Ref Range Status   SARS Coronavirus 2 NEGATIVE NEGATIVE Final    Comment: (NOTE) SARS-CoV-2 target nucleic acids are NOT DETECTED. The SARS-CoV-2 RNA is generally detectable in upper and lower respiratory specimens during the acute phase of infection. The lowest concentration of SARS-CoV-2 viral copies this assay can detect is 250 copies / mL. A negative result does not preclude SARS-CoV-2 infection and should not be used as the sole basis for treatment or other patient management decisions.  A negative result may occur with improper specimen collection / handling, submission of specimen other than nasopharyngeal swab, presence of viral mutation(s) within the areas targeted by this assay, and inadequate number of viral copies (<250 copies / mL). A negative result must be combined with clinical observations, patient history, and epidemiological information. Fact Sheet for Patients:    StrictlyIdeas.no Fact Sheet for Healthcare Providers: BankingDealers.co.za This test is not yet approved or cleared  by the [place] FDA and has been authorized for detection and/or diagnosis of SARS-CoV-2 by FDA under an Emergency Use Authorization (EUA).  This EUA will remain in effect (meaning this test can be used) for the duration of the COVID-19 declaration under Section 564(b)(1) of the Act, 21 U.S.C. section 360bbb-3(b)(1), unless the authorization is terminated or revoked sooner. Performed at [facility], [place] [address], [place], [place] [address]   Culture, blood (routine x 2)     Status: None   Collection Time: 06/02/20 10:04 PM   Specimen: BLOOD LEFT WRIST  Result Value Ref Range Status   Specimen Description BLOOD LEFT WRIST  Final   Special Requests   Final    BOTTLES DRAWN AEROBIC AND ANAEROBIC Blood Culture results may not be optimal due to an inadequate volume of blood received in culture bottles   Culture   Final    NO GROWTH 5 DAYS Performed at [facility], [place] [address] [place], [place] [address]    Report Status 06/07/2020 FINAL  Final  Culture, blood (routine x 2)     Status: None   Collection Time: 06/02/20 10:04 PM   Specimen: BLOOD  Result Value Ref Range Status   Specimen Description BLOOD  Final   Special Requests   Final    LEFT KNUCKLE BOTTLES DRAWN AEROBIC AND ANAEROBIC Blood Culture results may not be optimal due to an inadequate volume of blood received in culture bottles   Culture   Final    NO GROWTH 5 DAYS Performed at [facility], [place] [address], [place], [place] [address]    Report Status 06/07/2020 FINAL  Final  MRSA PCR Screening     Status: None  Collection Time: 06/03/20 10:57 PM   Specimen: Nasal Mucosa; Nasopharyngeal  Result Value Ref Range Status   MRSA by PCR NEGATIVE NEGATIVE Final    Comment:        The GeneXpert MRSA Assay (FDA approved for NASAL  specimens only), is one component of a comprehensive MRSA colonization surveillance program. It is not intended to diagnose MRSA infection nor to guide or monitor treatment for MRSA infections. Performed at [facility], [place] [address], [place], [place] [address]      BNP (last 3 results) Recent Labs    06/03/20 0604  BNP 1,981.3*     Oswald Hillock   [facility] If 7PM-7AM, please contact night-coverage at [email or web address], Office  [phone]   06/08/2020, 5:13 PM  LOS: 6 days

## 2020-06-08 NOTE — TOC Progression Note (Signed)
Transition of Care [facility]) - Progression Note    Patient Details  Name: SEBASTYAN SNODGRASS MRN: [id number] Date of Birth: 09-07-1928  Transition of Care [facility]) CM/SW [place], [place] Phone Number: 06/08/2020, 1:02 PM  Clinical Narrative:    CSW spoke with pt wife Vinnie Level via telephone. She confirms pt from home, was using cane but had several falls. Their home is a split level and she says multiple times during our conversation that she feels they need a home eval to look at safety and what modifications need to be done. CSW explained current recommendations and pt wife is familiar with SNF since pt went there in past. Pt wife amenable to CSW sending referral out to look at options.   TOC team to initiate referral, pt still on HFNC and not ready for d/c at this time.   Expected Discharge Plan: Skilled Nursing Facility Barriers to Discharge: Continued Medical Work up, Ship broker  Expected Discharge Plan and Services Expected Discharge Plan: [place] In-house Referral: Clinical Social Work Discharge Planning Services: CM Consult   Living arrangements for the past 2 months: Single Family Home  Readmission Risk Interventions Readmission Risk Prevention Plan 06/08/2020  Transportation Screening Complete  PCP or Specialist Appt within 3-5 Days Not Complete  Not Complete comments disposition pending- likely SNF  HRI or Home Care Consult Complete  Social Work Consult for [place] Planning/Counseling Complete  Palliative Care Screening Not Applicable  Medication Review Press photographer) Referral to Pharmacy

## 2020-06-09 DIAGNOSIS — K921 Melena: Secondary | ICD-10-CM

## 2020-06-09 LAB — CBC
HCT: 34.8 % — ABNORMAL LOW (ref 39.0–52.0)
Hemoglobin: 11.5 g/dL — ABNORMAL LOW (ref 13.0–17.0)
MCH: 33.4 pg (ref 26.0–34.0)
MCHC: 33 g/dL (ref 30.0–36.0)
MCV: 101.2 fL — ABNORMAL HIGH (ref 80.0–100.0)
Platelets: 135 10*3/uL — ABNORMAL LOW (ref 150–400)
RBC: 3.44 MIL/uL — ABNORMAL LOW (ref 4.22–5.81)
RDW: 12.3 % (ref 11.5–15.5)
WBC: 10 10*3/uL (ref 4.0–10.5)
nRBC: 0 % (ref 0.0–0.2)

## 2020-06-09 LAB — BASIC METABOLIC PANEL
Anion gap: 10 (ref 5–15)
BUN: 64 mg/dL — ABNORMAL HIGH (ref 8–23)
CO2: 26 mmol/L (ref 22–32)
Calcium: 8.3 mg/dL — ABNORMAL LOW (ref 8.9–10.3)
Chloride: 102 mmol/L (ref 98–111)
Creatinine, Ser: 1.74 mg/dL — ABNORMAL HIGH (ref 0.61–1.24)
GFR calc Af Amer: 39 mL/min — ABNORMAL LOW (ref >60)
GFR calc non Af Amer: 34 mL/min — ABNORMAL LOW (ref >60)
Glucose, Bld: 286 mg/dL — ABNORMAL HIGH (ref 70–99)
Potassium: 3.7 mmol/L (ref 3.5–5.1)
Sodium: 138 mmol/L (ref 135–145)

## 2020-06-09 MED ORDER — PANTOPRAZOLE SODIUM 40 MG PO TBEC
40.0000 mg | DELAYED_RELEASE_TABLET | Freq: Two times a day (BID) | ORAL | Status: DC
  Administered 2020-06-09 – 2020-06-10 (×3): 40 mg via ORAL
  Filled 2020-06-09 (×3): qty 1

## 2020-06-09 NOTE — Progress Notes (Signed)
[facility] [place] NOTE  Andrew [name] YIF:[id number] DOB: July 17, 1928 DOA: 06/02/2020 PCP: Patient, No Pcp Per   Brief HPI:   86 year old male with a history of hypertension, hyperlipidemia, CHF, paroxysmal atrial fibrillation on Eliquis, carotid stenosis, AAA presented to the ED after a fall at home.  Apparently patient got from his chair felt dizzy and fell hitting his head.  Patient was found to be in acute respiratory failure with hypercapnia, started on BiPAP.  ABG showed PCO2 of 73.7.   Subjective   Patient seen and examined, denies shortness of breath.  EKG was consulted yesterday for  guaic positive stool and drop in hemoglobin.   Assessment/Plan:     1. Acute hypercapnic respiratory failure-resolved, secondary to COPD patient's repeat ABG  showed PCO2 of 48.4.  BiPAP has been weaned off.  Continue duo nebs every 6 hours.  Patient started on Solu-Medrol 60 mg IV every 6 hours.  Solu-Medrol being weaned off.  Changed to 60 mg IV every 12 hours.   Mucinex 1 tablet p.o. twice daily, flutter valve every 4 hours. 2. Aspiration pneumonia-speech therapy was reconsulted, patient started on dysphagia 1 diet.  Continue IV Unasyn.  We will switch to p.o. Augmentin at the time of discharge 3. Acute on chronic systolic CHF-patient takes Lasix at home.  BNP elevated at 1981.3 patient was started on Lasix 40 mg IV every 12 hours, adequate diuresis.  Renal function little worse today.  IV Lasix was held.  Lasix restarted at lower dose of 40 mg twice a day.   Patient takes Lasix 120 mg in a.m. and 80 mg in p.m.   4. Uncontrolled hypertension-Home medications have been restarted, since patient is on BiPAP, oral medications have been held.  Restart home medication including Coreg and hydralazine. 5. Paroxysmal atrial fibrillation on chronic anticoagulation-patient is in normal sinus rhythm, patient started on heparin per pharmacy.  He takes Eliquis at home.  Eliquis on hold due to GI  bleed.  Continue amiodarone 100 mg p.o. daily.  6. Hypokalemia-replete 7. Anemia-patient's hemoglobin has dropped from 14.3-12.9.  Stool guaiac was weakly positive.  Will repeat FOBT.  Is not a candidate for EGD and colonoscopy considering his poor respiratory status.  Continue Protonix 40 mg daily.Will hold Eliquis.  We will also hold Pletal.  [facility] consulted.  Plan for EGD in a.m. 8. CKD stage IV-baseline creatinine around 2.  Today creatinine has improved to 1.74.  we will continue to monitor patient's creatinine in the hospital. 9. ? Cellulitis-patient was treated with cephalexin for lower extremity cellulitis, no signs and symptoms of infection.  Looks more likely venous stasis.  Continue to monitor off antibiotics. 10. Hypothyroidism- continue synthroid.       SpO2: 98 % O2 Flow Rate (L/min): 2 L/min FiO2 (%): 60 %   COVID-19 Labs  No results for input(s): DDIMER, FERRITIN, LDH, CRP in the last 72 hours.  Lab Results  Component Value Date   [place] NEGATIVE 06/02/2020     CBG: No results for input(s): GLUCAP in the last 168 hours.  CBC: Recent Labs  Lab 06/04/20 0427 06/05/20 0500 06/06/20 0301 06/08/20 0312 06/09/20 0613  WBC 14.2* 12.0* 8.1 9.2 10.0  HGB 15.2 13.2 12.9* 11.3* 11.5*  HCT 46.7 40.9 39.6 33.3* 34.8*  MCV 101.7* 102.8* 102.1* 100.3* 101.2*  PLT 172 135* 147* 142* 135*    Basic Metabolic Panel: Recent Labs  Lab 06/05/20 0500 06/06/20 0301 06/07/20 0516 06/08/20 0312 06/09/20 0613  NA  146* 145 141 140 138  K 3.2* 3.6 3.3* 3.8 3.7  CL 105 106 102 105 102  CO2 26 24 26 26 26   GLUCOSE 130* 210* 212* 245* 286*  BUN 50* 60* 64* 75* 64*  CREATININE 1.76* 1.83* 1.78* 1.96* 1.74*  CALCIUM 8.8* 8.7* 8.4* 8.3* 8.3*     Liver Function Tests: No results for input(s): AST, ALT, ALKPHOS, BILITOT, PROT, ALBUMIN in the last 168 hours.      DVT prophylaxis: Eliquis  Code Status: Full code  Family Communication: No family at  bedside    Status is: Inpatient  Dispo: The patient is from: Home              Anticipated d/c is to: Home              Anticipated d/c date is: 06/11/2020              Patient currently not medically stable  Barrier to discharge-patient is acute respiratory failure with hypercapnia, receiving IV solumedrol, upper GI bleed, EGD planned for tomorrow morning        Scheduled medications:  . amiodarone  100 mg Oral Daily  . atorvastatin  80 mg Oral Daily  . carvedilol  12.5 mg Oral BID  . chlorhexidine  15 mL Mouth Rinse BID  . ezetimibe  10 mg Oral Daily  . FLUoxetine  20 mg Oral Daily  . furosemide  40 mg Oral BID  . guaiFENesin  1,200 mg Oral BID  . hydrALAZINE  50 mg Oral TID  . isosorbide mononitrate  30 mg Oral Daily  . levothyroxine  100 mcg Oral Q0600  . mouth rinse  15 mL Mouth Rinse q12n4p  . methylPREDNISolone (SOLU-MEDROL) injection  40 mg Intravenous Q12H  . pantoprazole  40 mg Oral BID  . sodium chloride flush  3 mL Intravenous Q12H    Consultants:    Procedures:    Antibiotics:   Anti-infectives (From admission, onward)   Start     Dose/Rate Route Frequency Ordered Stop   06/05/20 1300  Ampicillin-Sulbactam (UNASYN) 3 g in sodium chloride 0.9 % 100 mL IVPB     Discontinue     3 g 200 mL/hr over 30 Minutes Intravenous Every 12 hours 06/05/20 0909 06/12/20 0059   06/05/20 0100  Ampicillin-Sulbactam (UNASYN) 3 g in sodium chloride 0.9 % 100 mL IVPB  Status:  Discontinued        3 g 200 mL/hr over 30 Minutes Intravenous Every 8 hours 06/05/20 0030 06/05/20 0909       Objective   Vitals:   06/09/20 0000 06/09/20 0418 06/09/20 0817 06/09/20 1200  BP: 139/68 (!) 171/83 (!) 144/52 (!) 150/76  Pulse: 78 69 68 64  Resp: 20 16 15 20   Temp: 99.5 F (37.5 C) 97.9 F (36.6 C) 98.3 F (36.8 C) 98.8 F (37.1 C)  TempSrc: Oral Oral Oral Oral  SpO2: 97% 96% 96% 98%  Weight:      Height:        Intake/Output Summary (Last 24 hours) at 06/09/2020  1254 Last data filed at 06/09/2020 0706 Gross per 24 hour  Intake 1780 ml  Output 1650 ml  Net 130 ml    06/13 1901 - 06/15 0700 In: 2260 [P.O.:2160] Out: 2000 [Urine:2000]  Filed Weights   06/03/20 0125 06/04/20 0446 06/08/20 0315  Weight: 81.8 kg 82.1 kg 88 kg    Physical Examination:  General-appears in no acute distress Heart-S1-S2, regular, no  murmur auscultated Lungs-clear to auscultation bilaterally, no wheezing or crackles auscultated Abdomen-soft, nontender, no organomegaly Extremities-no edema in the lower extremities Neuro-alert, oriented x3, no focal deficit noted  Data Reviewed:   Recent Results (from the past 240 hour(s))  SARS Coronavirus 2 by RT PCR (hospital order, performed in [facility] hospital lab) Nasopharyngeal Nasopharyngeal Swab     Status: None   Collection Time: 06/02/20 12:56 PM   Specimen: Nasopharyngeal Swab  Result Value Ref Range Status   SARS Coronavirus 2 NEGATIVE NEGATIVE Final    Comment: (NOTE) SARS-CoV-2 target nucleic acids are NOT DETECTED. The SARS-CoV-2 RNA is generally detectable in upper and lower respiratory specimens during the acute phase of infection. The lowest concentration of SARS-CoV-2 viral copies this assay can detect is 250 copies / mL. A negative result does not preclude SARS-CoV-2 infection and should not be used as the sole basis for treatment or other patient management decisions.  A negative result may occur with improper specimen collection / handling, submission of specimen other than nasopharyngeal swab, presence of viral mutation(s) within the areas targeted by this assay, and inadequate number of viral copies (<250 copies / mL). A negative result must be combined with clinical observations, patient history, and epidemiological information. Fact Sheet for Patients:   StrictlyIdeas.no Fact Sheet for Healthcare Providers: BankingDealers.co.za This test is not  yet approved or cleared  by the [place] FDA and has been authorized for detection and/or diagnosis of SARS-CoV-2 by FDA under an Emergency Use Authorization (EUA).  This EUA will remain in effect (meaning this test can be used) for the duration of the COVID-19 declaration under Section 564(b)(1) of the Act, 21 U.S.C. section 360bbb-3(b)(1), unless the authorization is terminated or revoked sooner. Performed at [facility], [place] [address], [place], [place] [address]   Culture, blood (routine x 2)     Status: None   Collection Time: 06/02/20 10:04 PM   Specimen: BLOOD LEFT WRIST  Result Value Ref Range Status   Specimen Description BLOOD LEFT WRIST  Final   Special Requests   Final    BOTTLES DRAWN AEROBIC AND ANAEROBIC Blood Culture results may not be optimal due to an inadequate volume of blood received in culture bottles   Culture   Final    NO GROWTH 5 DAYS Performed at [facility], [place] [address] [place], [place] [address]    Report Status 06/07/2020 FINAL  Final  Culture, blood (routine x 2)     Status: None   Collection Time: 06/02/20 10:04 PM   Specimen: BLOOD  Result Value Ref Range Status   Specimen Description BLOOD  Final   Special Requests   Final    LEFT KNUCKLE BOTTLES DRAWN AEROBIC AND ANAEROBIC Blood Culture results may not be optimal due to an inadequate volume of blood received in culture bottles   Culture   Final    NO GROWTH 5 DAYS Performed at [facility], [place] [address] [place], [place] [address]    Report Status 06/07/2020 FINAL  Final  MRSA PCR Screening     Status: None   Collection Time: 06/03/20 10:57 PM   Specimen: Nasal Mucosa; Nasopharyngeal  Result Value Ref Range Status   MRSA by PCR NEGATIVE NEGATIVE Final    Comment:        The GeneXpert MRSA Assay (FDA approved for NASAL specimens only), is one component of a comprehensive MRSA colonization surveillance program. It is not intended to diagnose  MRSA infection  nor to guide or monitor treatment for MRSA infections. Performed at [facility], [place] [address] [place], [place] [address]      BNP (last 3 results) Recent Labs    06/03/20 0604  BNP 1,981.3*     Oswald Hillock   [facility] If 7PM-7AM, please contact night-coverage at [email or web address], Office  [phone]   06/09/2020, 12:54 PM  LOS: 7 days

## 2020-06-09 NOTE — Progress Notes (Signed)
Physical Therapy Treatment Patient Details Name: Andrew [name] MRN: [id number] DOB: 1928-12-04 Today's Date: 06/09/2020    History of Present Illness 86 year old male with a history of hypertension, hyperlipidemia, CHF, paroxysmal atrial fibrillation on Eliquis, carotid stenosis, AAA presented to the ED after a fall at home.  Apparently patient got from his chair felt dizzy and fell hitting his head.  Patient was found to be in acute respiratory failure with hypercapnia, started on BiPAP.  ABG showed PCO2 of 73.7.    PT Comments    Pt agreeable to participating.  Need repetitive cues for direction, focus and redirection to task due to [facility] and mild confusion.  Emphasis on transitions to EOB, sit to stand, progressing gait with the RW   Follow Up Recommendations  SNF     Equipment Recommendations  Rolling walker with 5" wheels;3in1 (PT);Other (comment) (TBA)    Recommendations for Other Services       Precautions / Restrictions Precautions Precautions: Fall    Mobility  Bed Mobility Overal bed mobility: Needs Assistance Bed Mobility: Supine to Sit     Supine to sit: Min assist     General bed mobility comments: in to sitting then mod assist to assist scooting to EOB until feet on the floor then min assist.  Transfers Overall transfer level: Needs assistance   Transfers: Sit to/from Stand Sit to Stand: Min assist;+2 physical assistance            Ambulation/Gait Ambulation/Gait assistance: Min assist;Min guard Gait Distance (Feet): 65 Feet Assistive device: Rolling walker (2 wheeled) Gait Pattern/deviations: Step-through pattern Gait velocity: slower Gait velocity interpretation: <1.31 ft/sec, indicative of household ambulator General Gait Details: Generally steady, but trouble staying up in the RW during turns and needing consistent cues to stay up close to the RW.   Stairs             Wheelchair Mobility    Modified Rankin (Stroke Patients  Only)       Balance Overall balance assessment: Needs assistance Sitting-balance support: Bilateral upper extremity supported;No upper extremity supported Sitting balance-Leahy Scale: Fair     Standing balance support: Bilateral upper extremity supported Standing balance-Leahy Scale: Poor Standing balance comment: reliant on AD or external support                            Cognition Arousal/Alertness: Awake/alert Behavior During Therapy: WFL for tasks assessed/performed;Restless Overall Cognitive Status: No family/caregiver present to determine baseline cognitive functioning Area of Impairment: Safety/judgement;Awareness;Problem solving;Following commands;Memory                     Memory: Decreased short-term memory Following Commands: Follows one step commands inconsistently;Follows one step commands with increased time Safety/Judgement: Decreased awareness of safety;Decreased awareness of deficits Awareness: Emergent Problem Solving: Slow processing;Difficulty sequencing        Exercises Other Exercises Other Exercises: warm up hip/knee ROM exercise with graded resistance x10    General Comments General comments (skin integrity, edema, etc.): Sats in the low to mid 90% on 2L [place]      Pertinent Vitals/Pain Pain Assessment: No/denies pain    Home Living                      Prior Function            PT Goals (current goals can now be found in the care plan section) Acute Rehab PT Goals  Patient Stated Goal: To get well before going home PT Goal Formulation: Patient unable to participate in goal setting Time For Goal Achievement: 06/21/20 Potential to Achieve Goals: Good Progress towards PT goals: Progressing toward goals    Frequency    Min 2X/week      PT Plan Current plan remains appropriate    Co-evaluation              AM-PAC PT "6 Clicks" Mobility   Outcome Measure  Help needed turning from your back to your  side while in a flat bed without using bedrails?: A Lot Help needed moving from lying on your back to sitting on the side of a flat bed without using bedrails?: A Lot Help needed moving to and from a bed to a chair (including a wheelchair)?: A Little Help needed standing up from a chair using your arms (e.g., wheelchair or bedside chair)?: A Little Help needed to walk in hospital room?: A Little Help needed climbing 3-5 steps with a railing? : A Lot 6 Click Score: 15    End of Session   Activity Tolerance: Patient tolerated treatment well Patient left: in chair;with call bell/phone within reach;with chair alarm set Nurse Communication: Mobility status PT Visit Diagnosis: Unsteadiness on feet (R26.81);Other abnormalities of gait and mobility (R26.89);Muscle weakness (generalized) (M62.81)     Time: 6203-5597 PT Time Calculation (min) (ACUTE ONLY): 27 min  Charges:  $Gait Training: 8-22 mins $Therapeutic Activity: 8-22 mins                     06/09/2020  Ginger Carne., PT Acute Rehabilitation Services [phone]  (pager) [phone]  (office)   Andrew [name] 06/09/2020, 5:21 PM

## 2020-06-09 NOTE — H&P (View-Only) (Signed)
Referring Provider:[facility] Primary Care Physician:  Patient, No Pcp Per Primary Gastroenterologist:  Unassigned  Reason for Consultation:  Anemia, heme-positive stools  HPI: Andrew [name] is a 86 y.o. male with past medical history pertinent for A fib (on Eliquis), carotid stenosis, COPD (with recent exacerbation), CHF, and aspiration PNA, and recent fall due to dizziness upon standing (06/02/20) presenting for consultation of anemia and heme-positive stools.  Patient reports he feels well and denies any gastrointestinal symptoms.  Denies abdominal pain, nausea, vomiting, heartburn, dysphagia, decreased appetite, early satiety, unexplained weight loss, changes in bowel habits, melena, or hematochezia.  He is on Eliquis and reports taking either ibuprofen or Aleve 2-3 times per week.  Denies aspirin use.  Denies frequent alcohol use.  Patient states he had a colonoscopy many years ago but does not recall the findings.  He has never had an EGD.  Denies any family history of colon cancer or gastrointestinal malignancies.   Past Medical History:  Diagnosis Date  . CHF (congestive heart failure) ([place])     Prior to Admission medications   Medication Sig Start Date End Date Taking? Authorizing Provider  acetaminophen (TYLENOL) 500 MG tablet Take 1,000 mg by mouth every 6 (six) hours as needed for moderate pain or headache.   Yes [provider]  amiodarone (PACERONE) 200 MG tablet Take 100 mg by mouth daily. 02/27/20  Yes [provider]  atorvastatin (LIPITOR) 80 MG tablet Take 80 mg by mouth daily. 05/08/20  Yes [provider]  baclofen (LIORESAL) 10 MG tablet Take 10 mg by mouth daily as needed for muscle spasms.   Yes [provider]  carvedilol (COREG) 12.5 MG tablet Take 12.5 mg by mouth 2 (two) times daily. 04/09/20  Yes [provider]  cephALEXin (KEFLEX) 500 MG capsule Take 500 mg by mouth 4 (four) times daily. 05/28/20  Yes  [provider]  cilostazol (PLETAL) 100 MG tablet Take 100 mg by mouth 2 (two) times daily. 04/23/20  Yes [provider]  ELIQUIS 2.5 MG TABS tablet Take 2.5 mg by mouth 2 (two) times daily. 04/23/20  Yes [provider]  ezetimibe (ZETIA) 10 MG tablet Take 10 mg by mouth daily. 02/28/20  Yes [provider]  FLUoxetine (PROZAC) 20 MG capsule Take 20 mg by mouth daily. 02/28/20  Yes [provider]  furosemide (LASIX) 80 MG tablet Take 80-120 mg by mouth 2 (two) times daily. Take 1.5 tablets (120 mg) in the morning and Take 1 tablet (80 mg) in the afternoon 05/21/20  Yes [provider]  gabapentin (NEURONTIN) 100 MG capsule Take 100 mg by mouth 3 (three) times daily. 05/07/20  Yes [provider]  hydrALAZINE (APRESOLINE) 50 MG tablet Take 50 mg by mouth 3 (three) times daily. 03/26/20  Yes [provider]  isosorbide mononitrate (IMDUR) 30 MG 24 hr tablet Take 30 mg by mouth daily. 02/27/20  Yes [provider]  levothyroxine (SYNTHROID) 100 MCG tablet Take 100 mcg by mouth daily. 04/24/20  Yes [provider]  Multiple Vitamins-Minerals (MULTIVITAMIN ADULTS 50+) TABS Take 1 tablet by mouth daily.   Yes [provider]  potassium chloride SA (KLOR-CON) 20 MEQ tablet Take 20 mEq by mouth daily. 02/27/20  Yes [provider]  temazepam (RESTORIL) 30 MG capsule Take 30 mg by mouth at bedtime as needed for sleep.   Yes [provider]  tiotropium (SPIRIVA) 18 MCG inhalation capsule Place 18 mcg into inhaler and inhale daily  as needed (sob/wheezing).   Yes [provider]    Scheduled Meds: . amiodarone  100 mg Oral Daily  . atorvastatin  80 mg Oral Daily  . carvedilol  12.5 mg Oral BID  . chlorhexidine  15 mL Mouth Rinse BID  . ezetimibe  10 mg Oral Daily  . FLUoxetine  20 mg Oral Daily  . furosemide  40 mg Oral BID  . guaiFENesin  1,200 mg Oral BID  . hydrALAZINE  50 mg Oral TID   . isosorbide mononitrate  30 mg Oral Daily  . levothyroxine  100 mcg Oral Q0600  . mouth rinse  15 mL Mouth Rinse q12n4p  . methylPREDNISolone (SOLU-MEDROL) injection  40 mg Intravenous Q12H  . pantoprazole  40 mg Oral Q1200  . sodium chloride flush  3 mL Intravenous Q12H   Continuous Infusions: . sodium chloride Stopped (06/05/20 0321)  . ampicillin-sulbactam (UNASYN) IV 3 g (06/09/20 0111)   PRN Meds:.sodium chloride, hydrALAZINE, HYDROcodone-acetaminophen, ipratropium-albuterol, ondansetron **OR** ondansetron (ZOFRAN) IV, temazepam  Allergies as of 06/02/2020  . (No Known Allergies)    No family history on file.  Social History   Socioeconomic History  . Marital status: Married    Spouse name: Not on file  . Number of children: Not on file  . Years of education: Not on file  . Highest education level: Not on file  Occupational History  . Not on file  Tobacco Use  . Smoking status: Not on file  Substance and Sexual Activity  . Alcohol use: Not on file  . Drug use: Not on file  . Sexual activity: Not on file  Other Topics Concern  . Not on file  Social History Narrative  . Not on file   Social Determinants of Health   Financial Resource Strain:   . Difficulty of Paying Living Expenses:   Food Insecurity:   . Worried About Charity fundraiser in the Last Year:   . Arboriculturist in the Last Year:   Transportation Needs:   . Film/video editor (Medical):   Marland Kitchen Lack of Transportation (Non-Medical):   Physical Activity:   . Days of Exercise per Week:   . Minutes of Exercise per Session:   Stress:   . Feeling of Stress :   Social Connections:   . Frequency of Communication with Friends and Family:   . Frequency of Social Gatherings with Friends and Family:   . Attends Religious Services:   . Active Member of Clubs or Organizations:   . Attends Archivist Meetings:   Marland Kitchen Marital Status:   Intimate Partner Violence:   . Fear of Current or  Ex-Partner:   . Emotionally Abused:   Marland Kitchen Physically Abused:   . Sexually Abused:     Review of Systems: Review of Systems  Constitutional: Negative for chills, fever and weight loss.  HENT: Positive for hearing loss (chronic, age-related). Negative for sore throat.   Eyes: Negative for pain and redness.  Respiratory: Negative for cough and shortness of breath.   Cardiovascular: Negative for chest pain and palpitations.  Gastrointestinal: Negative for abdominal pain, blood in stool, constipation, diarrhea, heartburn, melena, nausea and vomiting.  Genitourinary: Negative for flank pain and hematuria.  Musculoskeletal: Positive for falls. Negative for back pain.  Skin: Negative for itching and rash.  Neurological: Negative for seizures and loss of consciousness.  Endo/Heme/Allergies: Negative for polydipsia. Bruises/bleeds easily (Eliquis).  Psychiatric/Behavioral: Negative for substance abuse. The patient is not  nervous/anxious.     Physical Exam: Vital signs: Vitals:   06/09/20 0418 06/09/20 0817  BP: (!) 171/83 (!) 144/52  Pulse: 69 68  Resp: 16 15  Temp: 97.9 F (36.6 C) 98.3 F (36.8 C)  SpO2: 96% 96%   Last BM Date: 06/07/20 Physical Exam Constitutional:      General: He is not in acute distress.    Appearance: Normal appearance. He is well-developed.     Comments: elderly  HENT:     Head: Normocephalic.     Comments: Bandage/dressing in place    Mouth/Throat:     Mouth: Mucous membranes are moist.     Pharynx: Oropharynx is clear.  Eyes:     General: No scleral icterus.    Extraocular Movements: Extraocular movements intact.     Conjunctiva/sclera: Conjunctivae normal.  Cardiovascular:     Rate and Rhythm: Normal rate and regular rhythm.     Pulses: Normal pulses.  Pulmonary:     Effort: Pulmonary effort is normal. No respiratory distress.     Breath sounds: Normal breath sounds.  Abdominal:     General: Bowel sounds are normal. There is no distension.      Palpations: Abdomen is soft. There is no mass.     Tenderness: There is no abdominal tenderness. There is no guarding or rebound.  Musculoskeletal:        General: No swelling or tenderness.     Cervical back: Normal range of motion and neck supple.     Right lower leg: No edema.     Left lower leg: No edema.  Skin:    General: Skin is warm and dry.  Neurological:     General: No focal deficit present.     Mental Status: He is alert and oriented to person, place, and time.  Psychiatric:        Mood and Affect: Mood normal.        Behavior: Behavior normal.      GI:  Lab Results: Recent Labs    06/08/20 0312 06/09/20 0613  WBC 9.2 10.0  HGB 11.3* 11.5*  HCT 33.3* 34.8*  PLT 142* 135*   BMET Recent Labs    06/07/20 0516 06/08/20 0312 06/09/20 0613  NA 141 140 138  K 3.3* 3.8 3.7  CL 102 105 102  CO2 26 26 26   GLUCOSE 212* 245* 286*  BUN 64* 75* 64*  CREATININE 1.78* 1.96* 1.74*  CALCIUM 8.4* 8.3* 8.3*   LFT No results for input(s): PROT, ALBUMIN, AST, ALT, ALKPHOS, BILITOT, BILIDIR, IBILI in the last 72 hours. PT/INR No results for input(s): LABPROT, INR in the last 72 hours.   Studies/Results: No results found.  Impression: Anemia and heme-positive stools in the setting of Eliquis use -Hgb 11.5 today, decreased from 12.9 on arrival (6/8), part of which may be dilutional -MCV 101.2 (macrocytic) -Platelets decreased to 135K -Persistently rising BUN (out of proportion to rise in Cr), suggestive of upper GI bleeding -NSAID use 2-3 times per week  Aspiration pneumonia: speech pathology completed modified barium swallow which showed mild pharyngeal dysphagia and esophageal stasis  CKD: BUN 64/ Cr 1.74.  BUN has risen since admission (BUN 42/ Cr 2.02 on arrival on 6/8)  COPD exacerbation with hypercapnia  A fib, on Eliquis  Plan: Proceed with EGD tomorrow with Dr. Therisa Doyne.  Procedure, nature, benefits, and risks reviewed with patient, and he is in  agreement to proceed with the EGD tomorrow.  Increase Protonix to 40mg   BID.  Continue to monitor H&H with transfusion as needed to maintain Hgb >8.  Continue to hold Eliquis.  Avoid NSAIDs.  [facility] will follow.   LOS: 7 days   Salley Slaughter  PA-C 06/09/2020, 8:21 AM  Contact #  [phone]

## 2020-06-09 NOTE — Progress Notes (Signed)
  Speech Language Pathology Treatment:    Patient Details Name: Andrew [name] MRN: [id number] DOB: 1928/06/10 Today's Date: 06/09/2020 Time: 5797-2820 SLP Time Calculation (min) (ACUTE ONLY): 19 min  Assessment / Plan / Recommendation Clinical Impression  Mr. Zavadil demonstrates good tolerance of current diet (purees and thin liquids). He was trialed with regular solids and had adequate oral clearance and timely mastication. Based on MBSS findings and improved respiratory status, pt appears safe to upgrade to a regular diet with thin liquids, but reported, "I don't want to get too crazy too fast--let's do an in-between." Patient prefers to upgrade slowly to Dys 2 diet with thin liquids then later upgrade to regular solids. He was re-educated on esophageal precautions of sitting upright at 90 degrees and remaining upright 30-60 mins after PO intake. He may also benefit from alternating between solids and liquids to help clear esophagus. Pt demonstrates understanding and agreement with recommendations. ST to follow for further upgrade of solids.    HPI HPI: 86 year old male with a history of hypertension, hyperlipidemia, CHF, paroxysmal atrial fibrillation on Eliquis, carotid stenosis, AAA presented to the ED after a fall at home.  Apparently patient got from his chair felt dizzy and fell hitting his head.  Patient was found to be in acute respiratory failure with hypercapnia, started on BiPAP.  ABG showed PCO2 of 73.7.  Pt evalauted by SLP on 6/10 with appearance of normal swallow. Overnight RN concerned for aspriation given rhonchorous breath sounds after drinking water so MBS ordered. MBSS WNL, but started on liquids only and upgrading.      SLP Plan  Continue with current plan of care       Recommendations  Diet recommendations: Dysphagia 2 (fine chop) Liquids provided via: Straw;Cup Medication Administration: Whole meds with puree Supervision: Patient able to self feed Compensations:  Follow solids with liquid Postural Changes and/or Swallow Maneuvers: Seated upright 90 degrees;Upright 30-60 min after meal         Oral Care Recommendations: Oral care BID Follow up Recommendations: None SLP Visit Diagnosis: Dysphagia, unspecified (R13.10) Plan: Continue with current plan of care                     Keenan Dimitrov P. Lexianna Weinrich, M.S., CCC-SLP Speech-Language Pathologist Acute Rehabilitation Services Pager: [place] 06/09/2020, 12:03 PM

## 2020-06-09 NOTE — TOC Progression Note (Signed)
Transition of Care [facility]) - Progression Note    Patient Details  Name: MUZAMIL HARKER MRN: [id number] Date of Birth: [birth year]  Transition of Care [facility]) CM/SW [place], [place] Phone Number: 06/09/2020, 4:16 PM  Clinical Narrative:     CSW gave bed offers to pt wife. Pts wife chose [facility]. Pt has regular [facility] and [facility] has been informed that they will have to start insurance auth.   Expected Discharge Plan: [place] Barriers to Discharge: Continued Medical Work up, Ship broker  Expected Discharge Plan and Services Expected Discharge Plan: [place] In-house Referral: Clinical Social Work Discharge Planning Services: CM Consult   Living arrangements for the past 2 months: [place] Determinants of Health (SDOH) Interventions    Readmission Risk Interventions Readmission Risk Prevention Plan 06/08/2020  Transportation Screening Complete  PCP or Specialist Appt within 3-5 Days Not Complete  Not Complete comments disposition pending- likely SNF  HRI or [place] Complete  Social Work Consult for [place] Planning/Counseling Complete  Palliative Care Screening Not Applicable  Medication Review Press photographer) Referral to [place], Latanya Presser, [place] Social Worker [phone]

## 2020-06-09 NOTE — Consult Note (Signed)
Referring Provider:[facility] Primary Care Physician:  Patient, No Pcp Per Primary Gastroenterologist:  Unassigned  Reason for Consultation:  Anemia, heme-positive stools  HPI: Andrew [name] is a 86 y.o. male with past medical history pertinent for A fib (on Eliquis), carotid stenosis, COPD (with recent exacerbation), CHF, and aspiration PNA, and recent fall due to dizziness upon standing (06/02/20) presenting for consultation of anemia and heme-positive stools.  Patient reports he feels well and denies any gastrointestinal symptoms.  Denies abdominal pain, nausea, vomiting, heartburn, dysphagia, decreased appetite, early satiety, unexplained weight loss, changes in bowel habits, melena, or hematochezia.  He is on Eliquis and reports taking either ibuprofen or Aleve 2-3 times per week.  Denies aspirin use.  Denies frequent alcohol use.  Patient states he had a colonoscopy many years ago but does not recall the findings.  He has never had an EGD.  Denies any family history of colon cancer or gastrointestinal malignancies.   Past Medical History:  Diagnosis Date  . CHF (congestive heart failure) ([place])     Prior to Admission medications   Medication Sig Start Date End Date Taking? Authorizing Provider  acetaminophen (TYLENOL) 500 MG tablet Take 1,000 mg by mouth every 6 (six) hours as needed for moderate pain or headache.   Yes [provider]  amiodarone (PACERONE) 200 MG tablet Take 100 mg by mouth daily. 02/27/20  Yes [provider]  atorvastatin (LIPITOR) 80 MG tablet Take 80 mg by mouth daily. 05/08/20  Yes [provider]  baclofen (LIORESAL) 10 MG tablet Take 10 mg by mouth daily as needed for muscle spasms.   Yes [provider]  carvedilol (COREG) 12.5 MG tablet Take 12.5 mg by mouth 2 (two) times daily. 04/09/20  Yes [provider]  cephALEXin (KEFLEX) 500 MG capsule Take 500 mg by mouth 4 (four) times daily. 05/28/20  Yes  [provider]  cilostazol (PLETAL) 100 MG tablet Take 100 mg by mouth 2 (two) times daily. 04/23/20  Yes [provider]  ELIQUIS 2.5 MG TABS tablet Take 2.5 mg by mouth 2 (two) times daily. 04/23/20  Yes [provider]  ezetimibe (ZETIA) 10 MG tablet Take 10 mg by mouth daily. 02/28/20  Yes [provider]  FLUoxetine (PROZAC) 20 MG capsule Take 20 mg by mouth daily. 02/28/20  Yes [provider]  furosemide (LASIX) 80 MG tablet Take 80-120 mg by mouth 2 (two) times daily. Take 1.5 tablets (120 mg) in the morning and Take 1 tablet (80 mg) in the afternoon 05/21/20  Yes [provider]  gabapentin (NEURONTIN) 100 MG capsule Take 100 mg by mouth 3 (three) times daily. 05/07/20  Yes [provider]  hydrALAZINE (APRESOLINE) 50 MG tablet Take 50 mg by mouth 3 (three) times daily. 03/26/20  Yes [provider]  isosorbide mononitrate (IMDUR) 30 MG 24 hr tablet Take 30 mg by mouth daily. 02/27/20  Yes [provider]  levothyroxine (SYNTHROID) 100 MCG tablet Take 100 mcg by mouth daily. 04/24/20  Yes [provider]  Multiple Vitamins-Minerals (MULTIVITAMIN ADULTS 50+) TABS Take 1 tablet by mouth daily.   Yes [provider]  potassium chloride SA (KLOR-CON) 20 MEQ tablet Take 20 mEq by mouth daily. 02/27/20  Yes [provider]  temazepam (RESTORIL) 30 MG capsule Take 30 mg by mouth at bedtime as needed for sleep.   Yes [provider]  tiotropium (SPIRIVA) 18 MCG inhalation capsule Place 18 mcg into inhaler and inhale daily  as needed (sob/wheezing).   Yes [provider]    Scheduled Meds: . amiodarone  100 mg Oral Daily  . atorvastatin  80 mg Oral Daily  . carvedilol  12.5 mg Oral BID  . chlorhexidine  15 mL Mouth Rinse BID  . ezetimibe  10 mg Oral Daily  . FLUoxetine  20 mg Oral Daily  . furosemide  40 mg Oral BID  . guaiFENesin  1,200 mg Oral BID  . hydrALAZINE  50 mg Oral TID   . isosorbide mononitrate  30 mg Oral Daily  . levothyroxine  100 mcg Oral Q0600  . mouth rinse  15 mL Mouth Rinse q12n4p  . methylPREDNISolone (SOLU-MEDROL) injection  40 mg Intravenous Q12H  . pantoprazole  40 mg Oral Q1200  . sodium chloride flush  3 mL Intravenous Q12H   Continuous Infusions: . sodium chloride Stopped (06/05/20 0321)  . ampicillin-sulbactam (UNASYN) IV 3 g (06/09/20 0111)   PRN Meds:.sodium chloride, hydrALAZINE, HYDROcodone-acetaminophen, ipratropium-albuterol, ondansetron **OR** ondansetron (ZOFRAN) IV, temazepam  Allergies as of 06/02/2020  . (No Known Allergies)    No family history on file.  Social History   Socioeconomic History  . Marital status: Married    Spouse name: Not on file  . Number of children: Not on file  . Years of education: Not on file  . Highest education level: Not on file  Occupational History  . Not on file  Tobacco Use  . Smoking status: Not on file  Substance and Sexual Activity  . Alcohol use: Not on file  . Drug use: Not on file  . Sexual activity: Not on file  Other Topics Concern  . Not on file  Social History Narrative  . Not on file   Social Determinants of Health   Financial Resource Strain:   . Difficulty of Paying Living Expenses:   Food Insecurity:   . Worried About Charity fundraiser in the Last Year:   . Arboriculturist in the Last Year:   Transportation Needs:   . Film/video editor (Medical):   Marland Kitchen Lack of Transportation (Non-Medical):   Physical Activity:   . Days of Exercise per Week:   . Minutes of Exercise per Session:   Stress:   . Feeling of Stress :   Social Connections:   . Frequency of Communication with Friends and Family:   . Frequency of Social Gatherings with Friends and Family:   . Attends Religious Services:   . Active Member of Clubs or Organizations:   . Attends Archivist Meetings:   Marland Kitchen Marital Status:   Intimate Partner Violence:   . Fear of Current or  Ex-Partner:   . Emotionally Abused:   Marland Kitchen Physically Abused:   . Sexually Abused:     Review of Systems: Review of Systems  Constitutional: Negative for chills, fever and weight loss.  HENT: Positive for hearing loss (chronic, age-related). Negative for sore throat.   Eyes: Negative for pain and redness.  Respiratory: Negative for cough and shortness of breath.   Cardiovascular: Negative for chest pain and palpitations.  Gastrointestinal: Negative for abdominal pain, blood in stool, constipation, diarrhea, heartburn, melena, nausea and vomiting.  Genitourinary: Negative for flank pain and hematuria.  Musculoskeletal: Positive for falls. Negative for back pain.  Skin: Negative for itching and rash.  Neurological: Negative for seizures and loss of consciousness.  Endo/Heme/Allergies: Negative for polydipsia. Bruises/bleeds easily (Eliquis).  Psychiatric/Behavioral: Negative for substance abuse. The patient is not  nervous/anxious.     Physical Exam: Vital signs: Vitals:   06/09/20 0418 06/09/20 0817  BP: (!) 171/83 (!) 144/52  Pulse: 69 68  Resp: 16 15  Temp: 97.9 F (36.6 C) 98.3 F (36.8 C)  SpO2: 96% 96%   Last BM Date: 06/07/20 Physical Exam Constitutional:      General: He is not in acute distress.    Appearance: Normal appearance. He is well-developed.     Comments: elderly  HENT:     Head: Normocephalic.     Comments: Bandage/dressing in place    Mouth/Throat:     Mouth: Mucous membranes are moist.     Pharynx: Oropharynx is clear.  Eyes:     General: No scleral icterus.    Extraocular Movements: Extraocular movements intact.     Conjunctiva/sclera: Conjunctivae normal.  Cardiovascular:     Rate and Rhythm: Normal rate and regular rhythm.     Pulses: Normal pulses.  Pulmonary:     Effort: Pulmonary effort is normal. No respiratory distress.     Breath sounds: Normal breath sounds.  Abdominal:     General: Bowel sounds are normal. There is no distension.      Palpations: Abdomen is soft. There is no mass.     Tenderness: There is no abdominal tenderness. There is no guarding or rebound.  Musculoskeletal:        General: No swelling or tenderness.     Cervical back: Normal range of motion and neck supple.     Right lower leg: No edema.     Left lower leg: No edema.  Skin:    General: Skin is warm and dry.  Neurological:     General: No focal deficit present.     Mental Status: He is alert and oriented to person, place, and time.  Psychiatric:        Mood and Affect: Mood normal.        Behavior: Behavior normal.      GI:  Lab Results: Recent Labs    06/08/20 0312 06/09/20 0613  WBC 9.2 10.0  HGB 11.3* 11.5*  HCT 33.3* 34.8*  PLT 142* 135*   BMET Recent Labs    06/07/20 0516 06/08/20 0312 06/09/20 0613  NA 141 140 138  K 3.3* 3.8 3.7  CL 102 105 102  CO2 26 26 26   GLUCOSE 212* 245* 286*  BUN 64* 75* 64*  CREATININE 1.78* 1.96* 1.74*  CALCIUM 8.4* 8.3* 8.3*   LFT No results for input(s): PROT, ALBUMIN, AST, ALT, ALKPHOS, BILITOT, BILIDIR, IBILI in the last 72 hours. PT/INR No results for input(s): LABPROT, INR in the last 72 hours.   Studies/Results: No results found.  Impression: Anemia and heme-positive stools in the setting of Eliquis use -Hgb 11.5 today, decreased from 12.9 on arrival (6/8), part of which may be dilutional -MCV 101.2 (macrocytic) -Platelets decreased to 135K -Persistently rising BUN (out of proportion to rise in Cr), suggestive of upper GI bleeding -NSAID use 2-3 times per week  Aspiration pneumonia: speech pathology completed modified barium swallow which showed mild pharyngeal dysphagia and esophageal stasis  CKD: BUN 64/ Cr 1.74.  BUN has risen since admission (BUN 42/ Cr 2.02 on arrival on 6/8)  COPD exacerbation with hypercapnia  A fib, on Eliquis  Plan: Proceed with EGD tomorrow with Dr. Therisa Doyne.  Procedure, nature, benefits, and risks reviewed with patient, and he is in  agreement to proceed with the EGD tomorrow.  Increase Protonix to 40mg   BID.  Continue to monitor H&H with transfusion as needed to maintain Hgb >8.  Continue to hold Eliquis.  Avoid NSAIDs.  [facility] will follow.   LOS: 7 days   Salley Slaughter  PA-C 06/09/2020, 8:21 AM  Contact #  [phone]

## 2020-06-09 NOTE — Plan of Care (Signed)
  Problem: Clinical Measurements: Goal: Will remain free from infection Outcome: Progressing Goal: Diagnostic test results will improve Outcome: Progressing Goal: Respiratory complications will improve Outcome: Progressing Goal: Cardiovascular complication will be avoided Outcome: Progressing   Problem: Activity: Goal: Risk for activity intolerance will decrease Outcome: Progressing   Problem: Pain Managment: Goal: General experience of comfort will improve Outcome: Progressing   Problem: Safety: Goal: Ability to remain free from injury will improve Outcome: Progressing

## 2020-06-10 ENCOUNTER — Encounter (HOSPITAL_COMMUNITY)
Admission: EM | Disposition: A | Discharge status: Skilled Nursing Facility | Payer: Self-pay | Source: Home / Self Care | Attending: Family Medicine

## 2020-06-10 ENCOUNTER — Inpatient Hospital Stay (HOSPITAL_COMMUNITY): Payer: Medicare PPO | Admitting: Anesthesiology

## 2020-06-10 ENCOUNTER — Encounter (HOSPITAL_COMMUNITY): Payer: Self-pay | Admitting: Internal Medicine

## 2020-06-10 HISTORY — PX: ESOPHAGEAL BRUSHING: SHX6842

## 2020-06-10 HISTORY — PX: BIOPSY: SHX5522

## 2020-06-10 HISTORY — PX: ESOPHAGOGASTRODUODENOSCOPY: SHX5428

## 2020-06-10 LAB — BASIC METABOLIC PANEL
Anion gap: 10 (ref 5–15)
BUN: 59 mg/dL — ABNORMAL HIGH (ref 8–23)
CO2: 28 mmol/L (ref 22–32)
Calcium: 8.5 mg/dL — ABNORMAL LOW (ref 8.9–10.3)
Chloride: 104 mmol/L (ref 98–111)
Creatinine, Ser: 1.81 mg/dL — ABNORMAL HIGH (ref 0.61–1.24)
GFR calc Af Amer: 38 mL/min — ABNORMAL LOW (ref >60)
GFR calc non Af Amer: 32 mL/min — ABNORMAL LOW (ref >60)
Glucose, Bld: 278 mg/dL — ABNORMAL HIGH (ref 70–99)
Potassium: 3.7 mmol/L (ref 3.5–5.1)
Sodium: 142 mmol/L (ref 135–145)

## 2020-06-10 LAB — CBC
HCT: 34 % — ABNORMAL LOW (ref 39.0–52.0)
Hemoglobin: 11.4 g/dL — ABNORMAL LOW (ref 13.0–17.0)
MCH: 33.7 pg (ref 26.0–34.0)
MCHC: 33.5 g/dL (ref 30.0–36.0)
MCV: 100.6 fL — ABNORMAL HIGH (ref 80.0–100.0)
Platelets: 137 10*3/uL — ABNORMAL LOW (ref 150–400)
RBC: 3.38 MIL/uL — ABNORMAL LOW (ref 4.22–5.81)
RDW: 12.2 % (ref 11.5–15.5)
WBC: 10.1 10*3/uL (ref 4.0–10.5)
nRBC: 0 % (ref 0.0–0.2)

## 2020-06-10 SURGERY — EGD (ESOPHAGOGASTRODUODENOSCOPY)
Anesthesia: Monitor Anesthesia Care

## 2020-06-10 MED ORDER — PANTOPRAZOLE SODIUM 40 MG PO TBEC
40.0000 mg | DELAYED_RELEASE_TABLET | Freq: Every day | ORAL | Status: DC
  Administered 2020-06-11 – 2020-06-12 (×2): 40 mg via ORAL
  Filled 2020-06-10 (×2): qty 1

## 2020-06-10 MED ORDER — LIDOCAINE 2% (20 MG/ML) 5 ML SYRINGE
INTRAMUSCULAR | Status: DC | PRN
  Administered 2020-06-10: 40 mg via INTRAVENOUS

## 2020-06-10 MED ORDER — PROPOFOL 500 MG/50ML IV EMUL
INTRAVENOUS | Status: DC | PRN
  Administered 2020-06-10: 100 ug/kg/min via INTRAVENOUS

## 2020-06-10 MED ORDER — LACTATED RINGERS IV SOLN: INTRAVENOUS | Status: DC

## 2020-06-10 MED ORDER — SODIUM CHLORIDE 0.9 % IV SOLN: INTRAVENOUS | Status: DC

## 2020-06-10 NOTE — Progress Notes (Signed)
Physical Therapy Treatment Patient Details Name: Andrew [name] MRN: [id number] DOB: Jun 25, 1928 Today's Date: 06/10/2020    History of Present Illness 86 year old male with a history of hypertension, hyperlipidemia, CHF, paroxysmal atrial fibrillation on Eliquis, carotid stenosis, AAA presented to the ED after a fall at home.  Apparently patient got from his chair felt dizzy and fell hitting his head.  Patient was found to be in acute respiratory failure with hypercapnia, started on BiPAP.  ABG showed PCO2 of 73.7.    PT Comments    Pt is notably impaired cognitively, needing frequent cues to keep on task and use the RW correctly.  Pt moves very slowly for all tasks.  Emphasis on transitions to EOB, scooting, sit to stand with stress on safety, progressing gait, standing stamina, during a long period of peri care.    Follow Up Recommendations  SNF     Equipment Recommendations  Rolling walker with 5" wheels;3in1 (PT);Other (comment)    Recommendations for Other Services       Precautions / Restrictions Precautions Precautions: Fall    Mobility  Bed Mobility Overal bed mobility: Needs Assistance       Supine to sit: Mod assist     General bed mobility comments: mod to come up and forward, then assist to scoot.  very long time periods to move.  Transfers Overall transfer level: Needs assistance   Transfers: Sit to/from Stand Sit to Stand: Min assist;Mod assist (depending on height of the surface.)            Ambulation/Gait Ambulation/Gait assistance: Min assist Gait Distance (Feet): 120 Feet Assistive device: Rolling walker (2 wheeled) Gait Pattern/deviations: Step-through pattern;Step-to pattern;Decreased step length - right;Decreased step length - left;Decreased stride length Gait velocity: slower Gait velocity interpretation: <1.31 ft/sec, indicative of household ambulator General Gait Details: very slow, guarded steps, generally steady in the RW, but  mildly unsteady when cruising holding to something ie rail or foot board.  Pt needs frequent cues for posture and proximity to the RW.  During turns the pt is somewhat outside and to the side of the AD.   Stairs             Wheelchair Mobility    Modified Rankin (Stroke Patients Only)       Balance Overall balance assessment: Needs assistance Sitting-balance support: Single extremity supported;Bilateral upper extremity supported Sitting balance-Leahy Scale: Fair     Standing balance support: Bilateral upper extremity supported Standing balance-Leahy Scale: Poor Standing balance comment: reliant on AD or external support                            Cognition Arousal/Alertness: Awake/alert Behavior During Therapy: WFL for tasks assessed/performed;Restless Overall Cognitive Status:  (Pt has cognitive issues and needs repetitive cues)                       Memory: Decreased short-term memory Following Commands: Follows one step commands inconsistently;Follows one step commands with increased time Safety/Judgement: Decreased awareness of safety;Decreased awareness of deficits Awareness: Emergent Problem Solving: Slow processing;Difficulty sequencing        Exercises Other Exercises Other Exercises: warm up hip/knee ROM exercise with graded resistance x10    General Comments General comments (skin integrity, edema, etc.): sats at 95% on 2.5 L      Pertinent Vitals/Pain Pain Assessment: Faces Faces Pain Scale: No hurt Pain Intervention(s): Monitored during session  Home Living                      Prior Function            PT Goals (current goals can now be found in the care plan section) Acute Rehab PT Goals PT Goal Formulation: Patient unable to participate in goal setting Time For Goal Achievement: 06/21/20 Potential to Achieve Goals: Good Progress towards PT goals: Progressing toward goals    Frequency    Min  2X/week      PT Plan Current plan remains appropriate    Co-evaluation              AM-PAC PT "6 Clicks" Mobility   Outcome Measure  Help needed turning from your back to your side while in a flat bed without using bedrails?: A Lot Help needed moving from lying on your back to sitting on the side of a flat bed without using bedrails?: A Lot Help needed moving to and from a bed to a chair (including a wheelchair)?: A Little Help needed standing up from a chair using your arms (e.g., wheelchair or bedside chair)?: A Lot Help needed to walk in hospital room?: A Little Help needed climbing 3-5 steps with a railing? : A Lot 6 Click Score: 14    End of Session   Activity Tolerance: Patient tolerated treatment well Patient left: in chair;with call bell/phone within reach;with chair alarm set   PT Visit Diagnosis: Unsteadiness on feet (R26.81);Other abnormalities of gait and mobility (R26.89);Muscle weakness (generalized) (M62.81)     Time: 7564-3329 PT Time Calculation (min) (ACUTE ONLY): 51 min  Charges:  $Gait Training: 8-22 mins $Therapeutic Activity: 23-37 mins                     06/10/2020  Ginger Carne., PT Acute Rehabilitation Services [phone]  (pager) [phone]  (office)   Tessie Fass Erykah Lippert 06/10/2020, 12:29 PM

## 2020-06-10 NOTE — TOC Progression Note (Signed)
Transition of Care [facility]) - Progression Note    Patient Details  Name: Andrew [name] MRN: [id number] Date of Birth: December 08, 1928  Transition of Care [facility]) CM/SW Cyril, LCSW Phone Number: 06/10/2020, 3:03 PM  Clinical Narrative:    CSW sent clinicals to insurance for review. [facility] aware patient may be ready for discharge in the next day or so.    Expected Discharge Plan: Skilled Nursing Facility Barriers to Discharge: Continued Medical Work up, Ship broker  Expected Discharge Plan and Services Expected Discharge Plan: [place] In-house Referral: Clinical Social Work Discharge Planning Services: CM Consult   Living arrangements for the past 2 months: Single Family Home                                       Social Determinants of Health (SDOH) Interventions    Readmission Risk Interventions Readmission Risk Prevention Plan 06/08/2020  Transportation Screening Complete  PCP or Specialist Appt within 3-5 Days Not Complete  Not Complete comments disposition pending- likely SNF  HRI or [place] Complete  Social Work Consult for [place] Planning/Counseling Complete  Palliative Care Screening Not Applicable  Medication Review Press photographer) Referral to Pharmacy

## 2020-06-10 NOTE — Anesthesia Procedure Notes (Signed)
Procedure Name: MAC Date/Time: 06/10/2020 1:12 PM Performed by: Kyung Rudd, CRNA Pre-anesthesia Checklist: Patient identified, Emergency Drugs available, Suction available and Patient being monitored Patient Re-evaluated:Patient Re-evaluated prior to induction Oxygen Delivery Method: Nasal cannula Induction Type: IV induction Placement Confirmation: positive ETCO2 Dental Injury: Teeth and Oropharynx as per pre-operative assessment

## 2020-06-10 NOTE — Anesthesia Preprocedure Evaluation (Addendum)
Anesthesia Evaluation  Patient identified by MRN, date of birth, ID band Patient awake    Reviewed: Allergy & Precautions, NPO status , Patient's Chart, lab work & pertinent test results, reviewed documented beta blocker date and time   History of Anesthesia Complications Negative for: history of anesthetic complications  Airway Mallampati: II  TM Distance: >3 FB Neck ROM: Full    Dental  (+)    Pulmonary pneumonia, unresolved, COPD,  COPD inhaler,  Admitted with acute hypercapneic respiratory failure, BiPAP has been weaned off, now on [place]   Pulmonary exam normal        Cardiovascular hypertension, Pt. on medications and Pt. on home beta blockers +CHF  Normal cardiovascular exam+ dysrhythmias (on Eliquis) Atrial Fibrillation      Neuro/Psych negative neurological ROS  negative psych ROS   GI/Hepatic Neg liver ROS, anemia, heme-positive stools   Endo/Other  Hypothyroidism   Renal/GU Renal InsufficiencyRenal disease  negative genitourinary   Musculoskeletal negative musculoskeletal ROS (+)   Abdominal   Peds  Hematology Hgb 11.4, plt 137   Anesthesia Other Findings Day of surgery medications reviewed with patient.  Reproductive/Obstetrics negative OB ROS                            Anesthesia Physical Anesthesia Plan  ASA: III  Anesthesia Plan: MAC   Post-op Pain Management:    Induction:   PONV Risk Score and Plan: 1 and Treatment may vary due to age or medical condition, Ondansetron and Propofol infusion  Airway Management Planned: Natural Airway and Simple Face Mask  Additional Equipment: None  Intra-op Plan:   Post-operative Plan:   Informed Consent: I have reviewed the patients History and Physical, chart, labs and discussed the procedure including the risks, benefits and alternatives for the proposed anesthesia with the patient or authorized representative who has  indicated his/her understanding and acceptance.     Dental advisory given  Plan Discussed with: CRNA  Anesthesia Plan Comments:        Anesthesia Quick Evaluation

## 2020-06-10 NOTE — Op Note (Signed)
[facility] Patient Name: Andrew [name] Procedure Date : 06/10/2020 MRN: [id number] Attending MD: Ronnette Juniper , MD Date of Birth: 1928/05/15 CSN: [id number] Age: 86 Admit Type: Inpatient Procedure:                Upper GI endoscopy Indications:              Anemia,                           Occult blood in stool Providers:                Ronnette Juniper, MD, Vista Lawman, RN, Lazaro Arms,                            Technician Referring MD:             [facility] Medicines:                Monitored Anesthesia Care Complications:            No immediate complications. Estimated blood loss:                            Minimal. Estimated Blood Loss:     Estimated blood loss was minimal. Procedure:                Pre-Anesthesia Assessment:                           - Prior to the procedure, a History and Physical                            was performed, and patient medications and                            allergies were reviewed. The patient's tolerance of                            previous anesthesia was also reviewed. The risks                            and benefits of the procedure and the sedation                            options and risks were discussed with the patient.                            All questions were answered, and informed consent                            was obtained. Prior Anticoagulants: The patient has                            taken Eliquis (apixaban), last dose was 1 day prior                            to procedure. ASA Grade Assessment:  III - A patient                            with severe systemic disease. After reviewing the                            risks and benefits, the patient was deemed in                            satisfactory condition to undergo the procedure.                           After obtaining informed consent, the endoscope was                            passed under direct vision. Throughout the                             procedure, the patient's blood pressure, pulse, and                            oxygen saturations were monitored continuously. The                            GIF-H190 ([id number]) Olympus gastroscope was                            introduced through the mouth, and advanced to the                            second part of duodenum. The upper GI endoscopy was                            accomplished without difficulty. The patient                            tolerated the procedure well. Scope In: Scope Out: Findings:      Patchy, white plaques were found in the middle third of the esophagus       and in the lower third of the esophagus. Cells for cytology were       obtained by brushing.      There were esophageal mucosal changes suggestive of short-segment       Barrett's esophagus present in the lower third of the esophagus. The       maximum longitudinal extent of these mucosal changes was 1 cm in length.       Mucosa was biopsied with a cold forceps for histology in a targeted       manner from 34 to 35 cm from the incisors. One specimen bottle was sent       to pathology.      Localized mildly erythematous mucosa without bleeding was found in the       gastric antrum. Biopsies were taken with a cold forceps for Helicobacter       pylori testing.      The cardia and gastric fundus were  normal on retroflexion.      The examined duodenum was normal. Impression:               - Esophageal plaques were found, suspicious for                            candidiasis. Cells for cytology obtained.                           - Esophageal mucosal changes suggestive of                            short-segment Barrett's esophagus. Biopsied.                           - Erythematous mucosa in the antrum. Biopsied.                           - Normal examined duodenum. Moderate Sedation:      Patient did not receive moderate sedation for this procedure, but       instead received monitored  anesthesia care. Recommendation:           - Resume previous diet.                           - Resume Eliquis (apixaban) at prior dose tomorrow.                           - Await pathology results.                           - Use Protonix (pantoprazole) 40 mg PO daily for 1                            month. Procedure Code(s):        --- Professional ---                           [phone], Esophagogastroduodenoscopy, flexible,                            transoral; with biopsy, single or multiple Diagnosis Code(s):        --- Professional ---                           K22.9, Disease of esophagus, unspecified                           K22.8, Other specified diseases of esophagus                           K31.89, Other diseases of stomach and duodenum                           R19.5, Other fecal abnormalities CPT copyright 2019 American Medical Association. All rights reserved. The codes documented in this report are preliminary and upon coder review may  be revised to meet current compliance requirements. Ronnette Juniper, MD 06/10/2020 1:37:12 PM This report has been signed electronically. Number of Addenda: 0

## 2020-06-10 NOTE — Transfer of Care (Signed)
Immediate Anesthesia Transfer of Care Note  Patient: Andrew [name]  Procedure(s) Performed: ESOPHAGOGASTRODUODENOSCOPY (EGD) (N/A ) BIOPSY ESOPHAGEAL BRUSHING  Patient Location: Endoscopy Unit  Anesthesia Type:MAC  Level of Consciousness: awake, alert  and oriented  Airway & Oxygen Therapy: Patient Spontanous Breathing and Patient connected to nasal cannula oxygen  Post-op Assessment: Report given to RN, Post -op Vital signs reviewed and stable and Patient moving all extremities X 4  Post vital signs: Reviewed and stable  Last Vitals:  Vitals Value Taken Time  BP 124/68 06/10/20 1335  Temp    Pulse 59 06/10/20 1336  Resp 19 06/10/20 1336  SpO2 93 % 06/10/20 1336  Vitals shown include unvalidated device data.  Last Pain:  Vitals:   06/10/20 1335  TempSrc:   PainSc: 0-No pain      Patients Stated Pain Goal: 0 (96/28/36 6294)  Complications: No complications documented.

## 2020-06-10 NOTE — Anesthesia Postprocedure Evaluation (Signed)
Anesthesia Post Note  Patient: Andrew [name]  Procedure(s) Performed: ESOPHAGOGASTRODUODENOSCOPY (EGD) (N/A ) BIOPSY ESOPHAGEAL BRUSHING     Patient location during evaluation: PACU Anesthesia Type: MAC Level of consciousness: awake and alert and oriented Pain management: pain level controlled Vital Signs Assessment: post-procedure vital signs reviewed and stable Respiratory status: spontaneous breathing, nonlabored ventilation and respiratory function stable Cardiovascular status: blood pressure returned to baseline Postop Assessment: no apparent nausea or vomiting Anesthetic complications: no   No complications documented.  Last Vitals:  Vitals:   06/10/20 1345 06/10/20 1355  BP: (!) 151/91 (!) 164/86  Pulse: (!) 56 (!) 55  Resp: 17 14  Temp:    SpO2: 96% 95%    Last Pain:  Vitals:   06/10/20 1600  TempSrc:   PainSc: 0-No pain                 Brennan Bailey

## 2020-06-10 NOTE — Interval H&P Note (Signed)
History and Physical Interval Note: 89/male with anemia and FOBT positive, was on Eliquis, on hold for 24 hours, for an EGD today.  06/10/2020 12:35 PM  Iona Beard A Savino  has presented today for surgery, with the diagnosis of anemia, heme-positive stools.  The various methods of treatment have been discussed with the patient and family. After consideration of risks, benefits and other options for treatment, the patient has consented to  Procedure(s): ESOPHAGOGASTRODUODENOSCOPY (EGD) (N/A) as a surgical intervention.  The patient's history has been reviewed, patient examined, no change in status, stable for surgery.  I have reviewed the patient's chart and labs.  Questions were answered to the patient's satisfaction.     Ronnette Juniper

## 2020-06-10 NOTE — Brief Op Note (Signed)
06/02/2020 - 06/10/2020  1:37 PM  PATIENT:  Andrew [name]  86 y.o. male  PRE-OPERATIVE DIAGNOSIS:  anemia, heme-positive stools  POST-OPERATIVE DIAGNOSIS:  biopsy of antrum and distal esophagus; esophageal brushings  PROCEDURE:  Procedure(s): ESOPHAGOGASTRODUODENOSCOPY (EGD) (N/A) BIOPSY ESOPHAGEAL BRUSHING  SURGEON:  Surgeon(s) and Role:    Ronnette Juniper, MD - Primary  PHYSICIAN ASSISTANT:   ASSISTANTS: Vista Lawman, RN, Clarnce Flock   ANESTHESIA:  MAC  EBL:  Minimal  BLOOD ADMINISTERED:none  DRAINS: none   LOCAL MEDICATIONS USED:  NONE  SPECIMEN:  Biopsy / Limited Resection  DISPOSITION OF SPECIMEN:  PATHOLOGY  COUNTS:  YES  TOURNIQUET:  * No tourniquets in log *  DICTATION: .Dragon Dictation  PLAN OF CARE: Admit to inpatient   PATIENT DISPOSITION:  PACU - hemodynamically stable.   Delay start of Pharmacological VTE agent (>24hrs) due to surgical blood loss or risk of bleeding: yes

## 2020-06-10 NOTE — Progress Notes (Signed)
[facility] PROGRESS NOTE    Progress Note  [name]  FGH:[id number] DOB: [birth year] DOA: 06/02/2020 PCP: Patient, No Pcp Per     Brief Narrative:   Andrew [name] is an 86 y.o. male past medical history of essential hypertension, hyperlipidemia, paroxysmal atrial fibrillation on Eliquis  Assessment/Plan:   Acute respiratory failure with hypercapnia ([place]) secondary to COPD exacerbation. Patient has been weaned off BiPAP she was started on IV Solu-Medrol and antibiotics on admission. This is in the setting of asthma duration pneumonia.  Aspiration pneumonia: Speech was reconsulted and recommended a dysphagia 1 diet, on admission started on IV Unasyn. He is currently n.p.o.  Chronic systolic heart failure: Her BNP was 1900 on admission likely reactive she was diuresed her creatinine increased Lasix was held and she was hydrated.  We will continue to hold Lasix for an additional 24 hours.  She appears to be euvolemic on physical exam.  Uncontrolled hypertension: Her home medications have been restarted Coreg and hydralazine.  Blood pressure today is significantly improved 140/83.  Paroxysmal atrial fibrillation: Currently rate controlled in sinus rhythm continue amiodarone.  On IV heparin.  Hypokalemia: Repleted orally now resolved.  Normocytic anemia/occult GI bleed: Her hemoglobin dropped from 14-13, initially not a candidate for EGD due to her poor respiratory status now that she has been off oxygen GI has been consulted and recommended an EGD for 06/10/2020.   Continue Protonix daily  Chronic kidney disease stage IV: With a baseline creatinine around 2, continue current regimen.  Venous stasis: Noted.  Hypothyroidism: Continue Synthroid.     DVT prophylaxis: SCD Family Communication:none Status is: Inpatient  Remains inpatient appropriate because:Hemodynamically unstable   Dispo: The patient is from: Home              Anticipated d/c is to:  SNF              Anticipated d/c date is: 1 day              Patient currently is not medically stable to d/c.        Code Status:     Code Status Orders  (From admission, onward)         Start     Ordered   06/02/20 1445  Full code  Continuous        06/02/20 1448        Code Status History    This patient has a current code status but no historical code status.   Advance Care Planning Activity        IV Access:    Peripheral IV   Procedures and diagnostic studies:   No results found.   Medical Consultants:    None.  Anti-Infectives:   Unasyn  Subjective:    [name] relates no new complaints today.  Objective:    Vitals:   06/10/20 0300 06/10/20 0356 06/10/20 0400 06/10/20 0700  BP:  (!) 164/87 (!) 154/73 (!) 158/74  Pulse: 71 70 67   Resp: 19 15 17    Temp:  97.7 F (36.5 C)  97.8 F (36.6 C)  TempSrc:  Oral  Oral  SpO2: (!) 84% 96% 94%   Weight:      Height:       SpO2: 94 % O2 Flow Rate (L/min): 3 L/min (Cannula came off pt while sleeping, also mouth breathing ) FiO2 (%): 60 %   Intake/Output Summary (Last 24 hours) at 06/10/2020 1020 Last data filed  at 06/10/2020 0700 Gross per 24 hour  Intake 625 ml  Output 2050 ml  Net -1425 ml   Filed Weights   06/03/20 0125 06/04/20 0446 06/08/20 0315  Weight: 81.8 kg 82.1 kg 88 kg    Exam: General exam: In no acute distress. Respiratory system: Good air movement and clear to auscultation. Cardiovascular system: S1 & S2 heard, RRR. No JVD. Gastrointestinal system: Abdomen is nondistended, soft and nontender.  Extremities: No pedal edema. Skin: No rashes, lesions or ulcers Psychiatry: Judgement and insight appear normal. Mood & affect appropriate.    Data Reviewed:    Labs: Basic Metabolic Panel: Recent Labs  Lab 06/06/20 0301 06/06/20 0301 06/07/20 0516 06/07/20 0516 06/08/20 0312 06/08/20 0312 06/09/20 0613 06/10/20 0355  NA 145  --  141  --  140  --   138 142  K 3.6   < > 3.3*   < > 3.8   < > 3.7 3.7  CL 106  --  102  --  105  --  102 104  CO2 24  --  26  --  26  --  26 28  GLUCOSE 210*  --  212*  --  245*  --  286* 278*  BUN 60*  --  64*  --  75*  --  64* 59*  CREATININE 1.83*  --  1.78*  --  1.96*  --  1.74* 1.81*  CALCIUM 8.7*  --  8.4*  --  8.3*  --  8.3* 8.5*   < > = values in this interval not displayed.   GFR Estimated Creatinine Clearance: 29.8 mL/min (A) (by C-G formula based on SCr of 1.81 mg/dL (H)). Liver Function Tests: No results for input(s): AST, ALT, ALKPHOS, BILITOT, PROT, ALBUMIN in the last 168 hours. No results for input(s): LIPASE, AMYLASE in the last 168 hours. No results for input(s): AMMONIA in the last 168 hours. Coagulation profile No results for input(s): INR, PROTIME in the last 168 hours. COVID-19 Labs  No results for input(s): DDIMER, FERRITIN, LDH, CRP in the last 72 hours.  Lab Results  Component Value Date   [place] NEGATIVE 06/02/2020    CBC: Recent Labs  Lab 06/05/20 0500 06/06/20 0301 06/08/20 0312 06/09/20 0613 06/10/20 0355  WBC 12.0* 8.1 9.2 10.0 10.1  HGB 13.2 12.9* 11.3* 11.5* 11.4*  HCT 40.9 39.6 33.3* 34.8* 34.0*  MCV 102.8* 102.1* 100.3* 101.2* 100.6*  PLT 135* 147* 142* 135* 137*   Cardiac Enzymes: No results for input(s): CKTOTAL, CKMB, CKMBINDEX, TROPONINI in the last 168 hours. BNP (last 3 results) No results for input(s): PROBNP in the last 8760 hours. CBG: No results for input(s): GLUCAP in the last 168 hours. D-Dimer: No results for input(s): DDIMER in the last 72 hours. Hgb A1c: No results for input(s): HGBA1C in the last 72 hours. Lipid Profile: No results for input(s): CHOL, HDL, LDLCALC, TRIG, CHOLHDL, LDLDIRECT in the last 72 hours. Thyroid function studies: No results for input(s): TSH, T4TOTAL, T3FREE, THYROIDAB in the last 72 hours.  Invalid input(s): FREET3 Anemia work up: No results for input(s): VITAMINB12, FOLATE, FERRITIN, TIBC, IRON,  RETICCTPCT in the last 72 hours. Sepsis Labs: Recent Labs  Lab 06/06/20 0301 06/08/20 0312 06/09/20 0613 06/10/20 0355  WBC 8.1 9.2 10.0 10.1   Microbiology Recent Results (from the past 240 hour(s))  SARS Coronavirus 2 by RT PCR (hospital order, performed in [facility] hospital lab) Nasopharyngeal Nasopharyngeal Swab     Status: None  Collection Time: 06/02/20 12:56 PM   Specimen: Nasopharyngeal Swab  Result Value Ref Range Status   SARS Coronavirus 2 NEGATIVE NEGATIVE Final    Comment: (NOTE) SARS-CoV-2 target nucleic acids are NOT DETECTED. The SARS-CoV-2 RNA is generally detectable in upper and lower respiratory specimens during the acute phase of infection. The lowest concentration of SARS-CoV-2 viral copies this assay can detect is 250 copies / mL. A negative result does not preclude SARS-CoV-2 infection and should not be used as the sole basis for treatment or other patient management decisions.  A negative result may occur with improper specimen collection / handling, submission of specimen other than nasopharyngeal swab, presence of viral mutation(s) within the areas targeted by this assay, and inadequate number of viral copies (<250 copies / mL). A negative result must be combined with clinical observations, patient history, and epidemiological information. Fact Sheet for Patients:   StrictlyIdeas.no Fact Sheet for Healthcare Providers: BankingDealers.co.za This test is not yet approved or cleared  by the [place] FDA and has been authorized for detection and/or diagnosis of SARS-CoV-2 by FDA under an Emergency Use Authorization (EUA).  This EUA will remain in effect (meaning this test can be used) for the duration of the COVID-19 declaration under Section 564(b)(1) of the Act, 21 U.S.C. section 360bbb-3(b)(1), unless the authorization is terminated or revoked sooner. Performed at [facility], [place]  [address] [place], [place] [address]   Culture, blood (routine x 2)     Status: None   Collection Time: 06/02/20 10:04 PM   Specimen: BLOOD LEFT WRIST  Result Value Ref Range Status   Specimen Description BLOOD LEFT WRIST  Final   Special Requests   Final    BOTTLES DRAWN AEROBIC AND ANAEROBIC Blood Culture results may not be optimal due to an inadequate volume of blood received in culture bottles   Culture   Final    NO GROWTH 5 DAYS Performed at [facility], [place] [address] [place], [place] [address]    Report Status 06/07/2020 FINAL  Final  Culture, blood (routine x 2)     Status: None   Collection Time: 06/02/20 10:04 PM   Specimen: BLOOD  Result Value Ref Range Status   Specimen Description BLOOD  Final   Special Requests   Final    LEFT KNUCKLE BOTTLES DRAWN AEROBIC AND ANAEROBIC Blood Culture results may not be optimal due to an inadequate volume of blood received in culture bottles   Culture   Final    NO GROWTH 5 DAYS Performed at [facility], [place] [address] [place], [place] [address]    Report Status 06/07/2020 FINAL  Final  MRSA PCR Screening     Status: None   Collection Time: 06/03/20 10:57 PM   Specimen: Nasal Mucosa; Nasopharyngeal  Result Value Ref Range Status   MRSA by PCR NEGATIVE NEGATIVE Final    Comment:        The GeneXpert MRSA Assay (FDA approved for NASAL specimens only), is one component of a comprehensive MRSA colonization surveillance program. It is not intended to diagnose MRSA infection nor to guide or monitor treatment for MRSA infections. Performed at [facility], [place] [address], [place],  [address]      Medications:   . amiodarone  100 mg Oral Daily  . atorvastatin  80 mg Oral Daily  . carvedilol  12.5 mg Oral BID  . chlorhexidine  15 mL Mouth Rinse BID  .  ezetimibe  10 mg Oral Daily  . FLUoxetine  20 mg Oral Daily  . furosemide  40 mg Oral BID  . guaiFENesin  1,200 mg Oral BID  . hydrALAZINE  50 mg Oral  TID  . isosorbide mononitrate  30 mg Oral Daily  . levothyroxine  100 mcg Oral Q0600  . mouth rinse  15 mL Mouth Rinse q12n4p  . methylPREDNISolone (SOLU-MEDROL) injection  40 mg Intravenous Q12H  . pantoprazole  40 mg Oral BID  . sodium chloride flush  3 mL Intravenous Q12H   Continuous Infusions: . sodium chloride Stopped (06/05/20 0321)  . ampicillin-sulbactam (UNASYN) IV 3 g (06/10/20 0019)      LOS: 8 days   Charlynne Cousins  [facility]  06/10/2020, 10:20 AM

## 2020-06-11 ENCOUNTER — Encounter (HOSPITAL_COMMUNITY): Payer: Self-pay | Admitting: Gastroenterology

## 2020-06-11 LAB — SURGICAL PATHOLOGY

## 2020-06-11 LAB — CYTOLOGY - NON PAP

## 2020-06-11 LAB — MAGNESIUM: Magnesium: 2 mg/dL (ref 1.7–2.4)

## 2020-06-11 MED ORDER — PREDNISONE 10 MG (21) PO TBPK: 10.0000 mg | ORAL_TABLET | Freq: Three times a day (TID) | ORAL | Status: DC

## 2020-06-11 MED ORDER — PREDNISONE 10 MG (21) PO TBPK: 10.0000 mg | ORAL_TABLET | ORAL | Status: DC

## 2020-06-11 MED ORDER — PREDNISONE 5 MG (21) PO TBPK
5.0000 mg | ORAL_TABLET | Freq: Three times a day (TID) | ORAL | Status: DC
  Administered 2020-06-12: 5 mg via ORAL

## 2020-06-11 MED ORDER — PREDNISONE 5 MG (21) PO TBPK: 10.0000 mg | ORAL_TABLET | Freq: Every evening | ORAL | Status: DC

## 2020-06-11 MED ORDER — APIXABAN 2.5 MG PO TABS
2.5000 mg | ORAL_TABLET | Freq: Two times a day (BID) | ORAL | Status: DC
  Administered 2020-06-11 – 2020-06-12 (×3): 2.5 mg via ORAL
  Filled 2020-06-11 (×3): qty 1

## 2020-06-11 MED ORDER — PREDNISONE 10 MG (21) PO TBPK: 20.0000 mg | ORAL_TABLET | Freq: Every evening | ORAL | Status: DC

## 2020-06-11 MED ORDER — PREDNISONE 5 MG (21) PO TBPK
5.0000 mg | ORAL_TABLET | ORAL | Status: AC
  Administered 2020-06-11: 5 mg via ORAL

## 2020-06-11 MED ORDER — PREDNISONE 10 MG (21) PO TBPK: 20.0000 mg | ORAL_TABLET | Freq: Every morning | ORAL | Status: DC

## 2020-06-11 MED ORDER — PREDNISONE 5 MG (21) PO TBPK
10.0000 mg | ORAL_TABLET | Freq: Every morning | ORAL | Status: AC
  Administered 2020-06-11: 10 mg via ORAL
  Filled 2020-06-11 (×2): qty 21

## 2020-06-11 MED ORDER — PREDNISONE 5 MG (21) PO TBPK
10.0000 mg | ORAL_TABLET | Freq: Every evening | ORAL | Status: AC
  Administered 2020-06-11: 10 mg via ORAL

## 2020-06-11 MED ORDER — NYSTATIN 100000 UNIT/ML MT SUSP
5.0000 mL | Freq: Four times a day (QID) | OROMUCOSAL | Status: DC
  Administered 2020-06-11 – 2020-06-12 (×5): 500000 [IU] via ORAL
  Filled 2020-06-11 (×5): qty 5

## 2020-06-11 MED ORDER — PREDNISONE 10 MG (21) PO TBPK: 10.0000 mg | ORAL_TABLET | Freq: Four times a day (QID) | ORAL | Status: DC

## 2020-06-11 MED ORDER — FLUCONAZOLE 100 MG PO TABS
100.0000 mg | ORAL_TABLET | Freq: Every day | ORAL | Status: DC
  Administered 2020-06-11 – 2020-06-12 (×2): 100 mg via ORAL
  Filled 2020-06-11 (×2): qty 1

## 2020-06-11 MED ORDER — FLUCONAZOLE 100MG IVPB: 100.0000 mg | INTRAVENOUS | Status: DC

## 2020-06-11 MED ORDER — FLUCONAZOLE IN SODIUM CHLORIDE 200-0.9 MG/100ML-% IV SOLN: 200.0000 mg | Freq: Once | INTRAVENOUS | Status: DC

## 2020-06-11 MED ORDER — LEVOTHYROXINE SODIUM 100 MCG PO TABS: 100.0000 ug | ORAL_TABLET | Freq: Every day | ORAL | Status: DC

## 2020-06-11 MED ORDER — POTASSIUM CHLORIDE CRYS ER 20 MEQ PO TBCR
40.0000 meq | EXTENDED_RELEASE_TABLET | Freq: Once | ORAL | Status: AC
  Administered 2020-06-11: 40 meq via ORAL
  Filled 2020-06-11: qty 2

## 2020-06-11 MED ORDER — PREDNISONE 5 MG (21) PO TBPK: 5.0000 mg | ORAL_TABLET | Freq: Four times a day (QID) | ORAL | Status: DC

## 2020-06-11 NOTE — Progress Notes (Signed)
Biopsies from stomach did not show any evidence of H. Pylori. Biopsy from distal esophagus compatible with Barrett's, no dysplasia noted. Due to a his age do not recommend further surveillance endoscopy.  Brush cytology from esophagus compatible with Candida, will start patient on Diflucan 100 mg p.o. daily for 5 days.  Ronnette Juniper, MD.

## 2020-06-11 NOTE — Progress Notes (Signed)
[facility] PROGRESS NOTE    Progress Note  Andrew [name]  UXL:[id number] DOB: 1928/09/12 DOA: 06/02/2020 PCP: Patient, No Pcp Per     Brief Narrative:   Andrew [name] is an 86 y.o. male past medical history of essential hypertension, hyperlipidemia, paroxysmal atrial fibrillation on Eliquis  Assessment/Plan:   Acute respiratory failure with hypercapnia ([place]) secondary to COPD exacerbation. Patient has been weaned off BiPAP she was started on IV Solu-Medrol, will transition to steroid taper. Continue IV Unasyn 3 L of oxygen to keep saturations greater than 92% we will try to wean to room air.  Aspiration pneumonia: Speech was reconsulted and recommended a dysphagia 1 diet, on admission started on IV Unasyn.  Chronic systolic heart failure: Her BNP was 1900 on admission likely reactive she was diuresed her creatinine increased Lasix was held and she was hydrated.  We will continue to hold Lasix for an additional 24 hours.   He appears to be euvolemic on physical exam.  Uncontrolled hypertension: Her home medications have been restarted Coreg and hydralazine.  Blood pressure today is significantly improved 140/83.  Paroxysmal atrial fibrillation: Currently rate controlled in sinus rhythm continue amiodarone.   Transition back to Eliquis discontinue IV heparin.  Hypokalemia: Repleted orally now resolved.  Normocytic anemia/occult GI bleed: Her hemoglobin dropped from 14-13, initially not a candidate for EGD that showed candidiasis, gastritis with no bleeding, continue oral Protonix. We will restart her Eliquis as per GI recommendations. Biopsy taken for rule out H. pylori and he has some Barrett's esophagus an EGD which will need to be follow-up as an outpatient.    Esophageal candidiasis: We will start her on IV Diflucan. Likely due to steroids will wean off oxygen quickly.  Chronic kidney disease stage IV: With a baseline creatinine around 2, continue current  regimen.  Venous stasis: Noted.  Hypothyroidism: Continue Synthroid.   DVT prophylaxis: SCD Family Communication:none Status is: Inpatient  Remains inpatient appropriate because:Hemodynamically unstable   Dispo: The patient is from: Home              Anticipated d/c is to: SNF              Anticipated d/c date is: 1 day              Patient currently is not medically stable to d/c.        Code Status:     Code Status Orders  (From admission, onward)         Start     Ordered   06/02/20 1445  Full code  Continuous        06/02/20 1448        Code Status History    This patient has a current code status but no historical code status.   Advance Care Planning Activity        IV Access:    Peripheral IV   Procedures and diagnostic studies:   No results found.   Medical Consultants:    None.  Anti-Infectives:   Unasyn  Subjective:    Andrew [name] feels great today no dysphagia.  Objective:    Vitals:   06/10/20 2000 06/10/20 2355 06/11/20 0400 06/11/20 0754  BP: (!) 182/91 (!) 149/64 131/61   Pulse: 75 63 65 60  Resp: 17 20 14    Temp: 99.2 F (37.3 C) 98.5 F (36.9 C) (!) 97 F (36.1 C) 98.6 F (37 C)  TempSrc: Oral Axillary Axillary Oral  SpO2:  100% 100% 100%   Weight:      Height:       SpO2: 100 % O2 Flow Rate (L/min): 3 L/min FiO2 (%): 60 %   Intake/Output Summary (Last 24 hours) at 06/11/2020 1031 Last data filed at 06/11/2020 0800 Gross per 24 hour  Intake 100 ml  Output 1825 ml  Net -1725 ml   Filed Weights   06/03/20 0125 06/04/20 0446 06/08/20 0315  Weight: 81.8 kg 82.1 kg 88 kg    Exam: General exam: In no acute distress. Respiratory system: Good air movement and clear to auscultation. Cardiovascular system: S1 & S2 heard, RRR. No JVD. Gastrointestinal system: Abdomen is nondistended, soft and nontender.  Extremities: No pedal edema. Skin: No rashes, lesions or ulcers Psychiatry: Judgement and  insight appear normal.  Data Reviewed:    Labs: Basic Metabolic Panel: Recent Labs  Lab 06/06/20 0301 06/06/20 0301 06/07/20 0516 06/07/20 0516 06/08/20 0312 06/08/20 0312 06/09/20 0613 06/10/20 0355  NA 145  --  141  --  140  --  138 142  K 3.6   < > 3.3*   < > 3.8   < > 3.7 3.7  CL 106  --  102  --  105  --  102 104  CO2 24  --  26  --  26  --  26 28  GLUCOSE 210*  --  212*  --  245*  --  286* 278*  BUN 60*  --  64*  --  75*  --  64* 59*  CREATININE 1.83*  --  1.78*  --  1.96*  --  1.74* 1.81*  CALCIUM 8.7*  --  8.4*  --  8.3*  --  8.3* 8.5*   < > = values in this interval not displayed.   GFR Estimated Creatinine Clearance: 29.8 mL/min (A) (by C-G formula based on SCr of 1.81 mg/dL (H)). Liver Function Tests: No results for input(s): AST, ALT, ALKPHOS, BILITOT, PROT, ALBUMIN in the last 168 hours. No results for input(s): LIPASE, AMYLASE in the last 168 hours. No results for input(s): AMMONIA in the last 168 hours. Coagulation profile No results for input(s): INR, PROTIME in the last 168 hours. COVID-19 Labs  No results for input(s): DDIMER, FERRITIN, LDH, CRP in the last 72 hours.  Lab Results  Component Value Date   [place] NEGATIVE 06/02/2020    CBC: Recent Labs  Lab 06/05/20 0500 06/06/20 0301 06/08/20 0312 06/09/20 0613 06/10/20 0355  WBC 12.0* 8.1 9.2 10.0 10.1  HGB 13.2 12.9* 11.3* 11.5* 11.4*  HCT 40.9 39.6 33.3* 34.8* 34.0*  MCV 102.8* 102.1* 100.3* 101.2* 100.6*  PLT 135* 147* 142* 135* 137*   Cardiac Enzymes: No results for input(s): CKTOTAL, CKMB, CKMBINDEX, TROPONINI in the last 168 hours. BNP (last 3 results) No results for input(s): PROBNP in the last 8760 hours. CBG: No results for input(s): GLUCAP in the last 168 hours. D-Dimer: No results for input(s): DDIMER in the last 72 hours. Hgb A1c: No results for input(s): HGBA1C in the last 72 hours. Lipid Profile: No results for input(s): CHOL, HDL, LDLCALC, TRIG, CHOLHDL,  LDLDIRECT in the last 72 hours. Thyroid function studies: No results for input(s): TSH, T4TOTAL, T3FREE, THYROIDAB in the last 72 hours.  Invalid input(s): FREET3 Anemia work up: No results for input(s): VITAMINB12, FOLATE, FERRITIN, TIBC, IRON, RETICCTPCT in the last 72 hours. Sepsis Labs: Recent Labs  Lab 06/06/20 0301 06/08/20 0312 06/09/20 0613 06/10/20 0355  WBC 8.1 9.2  10.0 10.1   Microbiology Recent Results (from the past 240 hour(s))  SARS Coronavirus 2 by RT PCR (hospital order, performed in [facility] hospital lab) Nasopharyngeal Nasopharyngeal Swab     Status: None   Collection Time: 06/02/20 12:56 PM   Specimen: Nasopharyngeal Swab  Result Value Ref Range Status   SARS Coronavirus 2 NEGATIVE NEGATIVE Final    Comment: (NOTE) SARS-CoV-2 target nucleic acids are NOT DETECTED. The SARS-CoV-2 RNA is generally detectable in upper and lower respiratory specimens during the acute phase of infection. The lowest concentration of SARS-CoV-2 viral copies this assay can detect is 250 copies / mL. A negative result does not preclude SARS-CoV-2 infection and should not be used as the sole basis for treatment or other patient management decisions.  A negative result may occur with improper specimen collection / handling, submission of specimen other than nasopharyngeal swab, presence of viral mutation(s) within the areas targeted by this assay, and inadequate number of viral copies (<250 copies / mL). A negative result must be combined with clinical observations, patient history, and epidemiological information. Fact Sheet for Patients:   StrictlyIdeas.no Fact Sheet for Healthcare Providers: BankingDealers.co.za This test is not yet approved or cleared  by the [place] FDA and has been authorized for detection and/or diagnosis of SARS-CoV-2 by FDA under an Emergency Use Authorization (EUA).  This EUA will remain in effect  (meaning this test can be used) for the duration of the COVID-19 declaration under Section 564(b)(1) of the Act, 21 U.S.C. section 360bbb-3(b)(1), unless the authorization is terminated or revoked sooner. Performed at [facility], [place] [address], [place], [place] [address]   Culture, blood (routine x 2)     Status: None   Collection Time: 06/02/20 10:04 PM   Specimen: BLOOD LEFT WRIST  Result Value Ref Range Status   Specimen Description BLOOD LEFT WRIST  Final   Special Requests   Final    BOTTLES DRAWN AEROBIC AND ANAEROBIC Blood Culture results may not be optimal due to an inadequate volume of blood received in culture bottles   Culture   Final    NO GROWTH 5 DAYS Performed at [facility], [place] [address], [place], [place] [address]    Report Status 06/07/2020 FINAL  Final  Culture, blood (routine x 2)     Status: None   Collection Time: 06/02/20 10:04 PM   Specimen: BLOOD  Result Value Ref Range Status   Specimen Description BLOOD  Final   Special Requests   Final    LEFT KNUCKLE BOTTLES DRAWN AEROBIC AND ANAEROBIC Blood Culture results may not be optimal due to an inadequate volume of blood received in culture bottles   Culture   Final    NO GROWTH 5 DAYS Performed at [facility], [place] [address], [place], [place] [address]    Report Status 06/07/2020 FINAL  Final  MRSA PCR Screening     Status: None   Collection Time: 06/03/20 10:57 PM   Specimen: Nasal Mucosa; Nasopharyngeal  Result Value Ref Range Status   MRSA by PCR NEGATIVE NEGATIVE Final    Comment:        The GeneXpert MRSA Assay (FDA approved for NASAL specimens only), is one component of a comprehensive MRSA colonization surveillance program. It is not intended to diagnose MRSA infection nor to guide or monitor treatment for MRSA infections. Performed at [facility], [place] [address], [place], [place] [address]  Medications:   . amiodarone  100 mg Oral Daily  .  atorvastatin  80 mg Oral Daily  . carvedilol  12.5 mg Oral BID  . chlorhexidine  15 mL Mouth Rinse BID  . ezetimibe  10 mg Oral Daily  . FLUoxetine  20 mg Oral Daily  . furosemide  40 mg Oral BID  . guaiFENesin  1,200 mg Oral BID  . hydrALAZINE  50 mg Oral TID  . isosorbide mononitrate  30 mg Oral Daily  . levothyroxine  100 mcg Oral Q0600  . mouth rinse  15 mL Mouth Rinse q12n4p  . methylPREDNISolone (SOLU-MEDROL) injection  40 mg Intravenous Q12H  . pantoprazole  40 mg Oral Daily  . sodium chloride flush  3 mL Intravenous Q12H   Continuous Infusions: . sodium chloride Stopped (06/05/20 0321)  . ampicillin-sulbactam (UNASYN) IV 3 g (06/11/20 0133)      LOS: 9 days   Charlynne Cousins  [facility]  06/11/2020, 10:31 AM

## 2020-06-11 NOTE — Progress Notes (Signed)
Pt belongings sent home with wife. (clothes, watch, and reading glasses)  No significant changes today, pt alert and oriented x 4. VS stable. Will continue to monitor.

## 2020-06-11 NOTE — TOC Progression Note (Addendum)
Transition of Care [facility]) - Progression Note    Patient Details  Name: Andrew [name] MRN: [id number] Date of Birth: [birth year]  Transition of Care [facility]) CM/SW [place], LCSW Phone Number: 06/11/2020, 10:44 AM  Clinical Narrative:    10:44am-CSW spoke with patient's spouse and arranged for her to complete paperwork at [facility] tomorrow at 11am where she will provide a copy of patient's COVID vaccine card. She requested CSW help locate patient's clothes. RN able to locate clothing in patient's drawer.   2:35pm-Insurance approval received: [id number] ID [id number] through 06/15/20.   Expected Discharge Plan: Skilled Nursing Facility Barriers to Discharge: Continued Medical Work up, Ship broker  Expected Discharge Plan and Services Expected Discharge Plan: [place] In-house Referral: Clinical Social Work Discharge Planning Services: CM Consult   Living arrangements for the past 2 months: Single Family Home                                       Social Determinants of Health (SDOH) Interventions    Readmission Risk Interventions Readmission Risk Prevention Plan 06/08/2020  Transportation Screening Complete  PCP or Specialist Appt within 3-5 Days Not Complete  Not Complete comments disposition pending- likely SNF  HRI or [place] Complete  Social Work Consult for [place] Planning/Counseling Complete  Palliative Care Screening Not Applicable  Medication Review [employer]) Referral to Pharmacy

## 2020-06-12 DIAGNOSIS — G9341 Metabolic encephalopathy: Secondary | ICD-10-CM | POA: Diagnosis not present

## 2020-06-12 DIAGNOSIS — R2681 Unsteadiness on feet: Secondary | ICD-10-CM | POA: Diagnosis not present

## 2020-06-12 DIAGNOSIS — J9601 Acute respiratory failure with hypoxia: Secondary | ICD-10-CM | POA: Diagnosis not present

## 2020-06-12 DIAGNOSIS — J441 Chronic obstructive pulmonary disease with (acute) exacerbation: Secondary | ICD-10-CM | POA: Diagnosis not present

## 2020-06-12 DIAGNOSIS — I509 Heart failure, unspecified: Secondary | ICD-10-CM | POA: Diagnosis not present

## 2020-06-12 DIAGNOSIS — E039 Hypothyroidism, unspecified: Secondary | ICD-10-CM | POA: Diagnosis not present

## 2020-06-12 DIAGNOSIS — J449 Chronic obstructive pulmonary disease, unspecified: Secondary | ICD-10-CM | POA: Diagnosis not present

## 2020-06-12 DIAGNOSIS — M4319 Spondylolisthesis, multiple sites in spine: Secondary | ICD-10-CM | POA: Diagnosis not present

## 2020-06-12 DIAGNOSIS — R531 Weakness: Secondary | ICD-10-CM | POA: Diagnosis not present

## 2020-06-12 DIAGNOSIS — R2689 Other abnormalities of gait and mobility: Secondary | ICD-10-CM | POA: Diagnosis not present

## 2020-06-12 DIAGNOSIS — J690 Pneumonitis due to inhalation of food and vomit: Secondary | ICD-10-CM | POA: Diagnosis not present

## 2020-06-12 DIAGNOSIS — S3993XA Unspecified injury of pelvis, initial encounter: Secondary | ICD-10-CM | POA: Diagnosis not present

## 2020-06-12 DIAGNOSIS — S8991XA Unspecified injury of right lower leg, initial encounter: Secondary | ICD-10-CM | POA: Diagnosis not present

## 2020-06-12 DIAGNOSIS — E785 Hyperlipidemia, unspecified: Secondary | ICD-10-CM | POA: Diagnosis not present

## 2020-06-12 DIAGNOSIS — M79672 Pain in left foot: Secondary | ICD-10-CM | POA: Diagnosis not present

## 2020-06-12 DIAGNOSIS — K219 Gastro-esophageal reflux disease without esophagitis: Secondary | ICD-10-CM | POA: Diagnosis not present

## 2020-06-12 DIAGNOSIS — M4186 Other forms of scoliosis, lumbar region: Secondary | ICD-10-CM | POA: Diagnosis not present

## 2020-06-12 DIAGNOSIS — I4891 Unspecified atrial fibrillation: Secondary | ICD-10-CM | POA: Diagnosis not present

## 2020-06-12 DIAGNOSIS — S199XXA Unspecified injury of neck, initial encounter: Secondary | ICD-10-CM | POA: Diagnosis not present

## 2020-06-12 DIAGNOSIS — S0990XA Unspecified injury of head, initial encounter: Secondary | ICD-10-CM | POA: Diagnosis not present

## 2020-06-12 DIAGNOSIS — M160 Bilateral primary osteoarthritis of hip: Secondary | ICD-10-CM | POA: Diagnosis not present

## 2020-06-12 DIAGNOSIS — R0902 Hypoxemia: Secondary | ICD-10-CM | POA: Diagnosis not present

## 2020-06-12 DIAGNOSIS — I6529 Occlusion and stenosis of unspecified carotid artery: Secondary | ICD-10-CM | POA: Diagnosis not present

## 2020-06-12 DIAGNOSIS — E782 Mixed hyperlipidemia: Secondary | ICD-10-CM | POA: Diagnosis not present

## 2020-06-12 DIAGNOSIS — J9811 Atelectasis: Secondary | ICD-10-CM | POA: Diagnosis not present

## 2020-06-12 DIAGNOSIS — M549 Dorsalgia, unspecified: Secondary | ICD-10-CM | POA: Diagnosis not present

## 2020-06-12 DIAGNOSIS — J9602 Acute respiratory failure with hypercapnia: Secondary | ICD-10-CM | POA: Diagnosis not present

## 2020-06-12 DIAGNOSIS — Y939 Activity, unspecified: Secondary | ICD-10-CM | POA: Diagnosis not present

## 2020-06-12 DIAGNOSIS — F0390 Unspecified dementia without behavioral disturbance: Secondary | ICD-10-CM | POA: Diagnosis not present

## 2020-06-12 DIAGNOSIS — W19XXXA Unspecified fall, initial encounter: Secondary | ICD-10-CM | POA: Diagnosis not present

## 2020-06-12 DIAGNOSIS — S8001XA Contusion of right knee, initial encounter: Secondary | ICD-10-CM | POA: Diagnosis not present

## 2020-06-12 DIAGNOSIS — N184 Chronic kidney disease, stage 4 (severe): Secondary | ICD-10-CM | POA: Diagnosis not present

## 2020-06-12 DIAGNOSIS — D649 Anemia, unspecified: Secondary | ICD-10-CM | POA: Diagnosis not present

## 2020-06-12 DIAGNOSIS — Z743 Need for continuous supervision: Secondary | ICD-10-CM | POA: Diagnosis not present

## 2020-06-12 DIAGNOSIS — F419 Anxiety disorder, unspecified: Secondary | ICD-10-CM | POA: Diagnosis not present

## 2020-06-12 DIAGNOSIS — I739 Peripheral vascular disease, unspecified: Secondary | ICD-10-CM | POA: Diagnosis not present

## 2020-06-12 DIAGNOSIS — E1122 Type 2 diabetes mellitus with diabetic chronic kidney disease: Secondary | ICD-10-CM | POA: Diagnosis not present

## 2020-06-12 DIAGNOSIS — I10 Essential (primary) hypertension: Secondary | ICD-10-CM | POA: Diagnosis not present

## 2020-06-12 DIAGNOSIS — Y92129 Unspecified place in nursing home as the place of occurrence of the external cause: Secondary | ICD-10-CM | POA: Diagnosis not present

## 2020-06-12 DIAGNOSIS — S3992XA Unspecified injury of lower back, initial encounter: Secondary | ICD-10-CM | POA: Diagnosis not present

## 2020-06-12 DIAGNOSIS — I480 Paroxysmal atrial fibrillation: Secondary | ICD-10-CM | POA: Diagnosis not present

## 2020-06-12 DIAGNOSIS — I700 Atherosclerosis of aorta: Secondary | ICD-10-CM | POA: Diagnosis not present

## 2020-06-12 DIAGNOSIS — J9621 Acute and chronic respiratory failure with hypoxia: Secondary | ICD-10-CM | POA: Diagnosis not present

## 2020-06-12 DIAGNOSIS — S80912A Unspecified superficial injury of left knee, initial encounter: Secondary | ICD-10-CM | POA: Diagnosis present

## 2020-06-12 DIAGNOSIS — R6889 Other general symptoms and signs: Secondary | ICD-10-CM | POA: Diagnosis not present

## 2020-06-12 DIAGNOSIS — S0003XA Contusion of scalp, initial encounter: Secondary | ICD-10-CM | POA: Diagnosis not present

## 2020-06-12 DIAGNOSIS — I517 Cardiomegaly: Secondary | ICD-10-CM | POA: Diagnosis not present

## 2020-06-12 DIAGNOSIS — F322 Major depressive disorder, single episode, severe without psychotic features: Secondary | ICD-10-CM | POA: Diagnosis not present

## 2020-06-12 DIAGNOSIS — R41841 Cognitive communication deficit: Secondary | ICD-10-CM | POA: Diagnosis not present

## 2020-06-12 DIAGNOSIS — M6281 Muscle weakness (generalized): Secondary | ICD-10-CM | POA: Diagnosis not present

## 2020-06-12 DIAGNOSIS — Y999 Unspecified external cause status: Secondary | ICD-10-CM | POA: Diagnosis not present

## 2020-06-12 DIAGNOSIS — M1990 Unspecified osteoarthritis, unspecified site: Secondary | ICD-10-CM | POA: Diagnosis not present

## 2020-06-12 DIAGNOSIS — E559 Vitamin D deficiency, unspecified: Secondary | ICD-10-CM | POA: Diagnosis not present

## 2020-06-12 DIAGNOSIS — F329 Major depressive disorder, single episode, unspecified: Secondary | ICD-10-CM | POA: Diagnosis not present

## 2020-06-12 DIAGNOSIS — Z7901 Long term (current) use of anticoagulants: Secondary | ICD-10-CM | POA: Diagnosis not present

## 2020-06-12 DIAGNOSIS — M1711 Unilateral primary osteoarthritis, right knee: Secondary | ICD-10-CM | POA: Diagnosis not present

## 2020-06-12 DIAGNOSIS — R69 Illness, unspecified: Secondary | ICD-10-CM | POA: Diagnosis not present

## 2020-06-12 DIAGNOSIS — Z7401 Bed confinement status: Secondary | ICD-10-CM | POA: Diagnosis not present

## 2020-06-12 DIAGNOSIS — M2550 Pain in unspecified joint: Secondary | ICD-10-CM | POA: Diagnosis not present

## 2020-06-12 MED ORDER — FUROSEMIDE 40 MG PO TABS: 40.0000 mg | ORAL_TABLET | Freq: Two times a day (BID) | ORAL | Status: DC

## 2020-06-12 MED ORDER — PREDNISONE 10 MG PO TABS
ORAL_TABLET | ORAL | 0 refills | Status: DC
Start: 2020-06-12 — End: 2020-07-30

## 2020-06-12 MED ORDER — FLUCONAZOLE 100 MG PO TABS: 100.0000 mg | ORAL_TABLET | Freq: Every day | ORAL | 0 refills | Status: DC

## 2020-06-12 MED ORDER — PREDNISONE 1 MG PO TABS
5.0000 mg | ORAL_TABLET | Freq: Every day | ORAL | 0 refills | Status: DC
Start: 2020-06-12 — End: 2020-06-12

## 2020-06-12 MED ORDER — PANTOPRAZOLE SODIUM 40 MG PO TBEC: 40.0000 mg | DELAYED_RELEASE_TABLET | Freq: Two times a day (BID) | ORAL | Status: DC

## 2020-06-12 NOTE — Progress Notes (Signed)
Physical Therapy Treatment Patient Details Name: Andrew [name] MRN: [id number] DOB: 08-10-1928 Today's Date: 06/12/2020    History of Present Illness 86 year old male with a history of hypertension, hyperlipidemia, CHF, paroxysmal atrial fibrillation on Eliquis, carotid stenosis, AAA presented to the ED after a fall at home.  Apparently patient got from his chair felt dizzy and fell hitting his head.  Patient was found to be in acute respiratory failure with hypercapnia, started on BiPAP.  ABG showed PCO2 of 73.7.    PT Comments    Pt agreed to participate with encouragement.  Still needs consistent cuing for direction/redirection.  Emphasis on transitions and gait safety.    Follow Up Recommendations  SNF     Equipment Recommendations  Rolling walker with 5" wheels;3in1 (PT);Other (comment)    Recommendations for Other Services       Precautions / Restrictions Precautions Precautions: Fall    Mobility  Bed Mobility Overal bed mobility: Needs Assistance       Supine to sit: Mod assist     General bed mobility comments: mod to come up and forward, then assist to scoot.  very long time periods to move.  Transfers Overall transfer level: Needs assistance   Transfers: Sit to/from Stand Sit to Stand: Mod assist            Ambulation/Gait Ambulation/Gait assistance: Min assist Gait Distance (Feet): 70 Feet Assistive device: Rolling walker (2 wheeled) Gait Pattern/deviations: Step-through pattern;Step-to pattern;Decreased step length - right;Decreased step length - left;Decreased stride length Gait velocity: slower   General Gait Details: very slow, guarded steps, generally steady in the RW,  Pt needs frequent cues for posture and proximity to the RW.  During turns the pt is somewhat outside and to the side of the AD.   Stairs             Wheelchair Mobility    Modified Rankin (Stroke Patients Only)       Balance Overall balance assessment: Needs  assistance Sitting-balance support: Single extremity supported;Bilateral upper extremity supported Sitting balance-Leahy Scale: Fair     Standing balance support: Bilateral upper extremity supported Standing balance-Leahy Scale: Poor Standing balance comment: reliant on AD or external support                            Cognition Arousal/Alertness: Awake/alert Behavior During Therapy: WFL for tasks assessed/performed;Restless Overall Cognitive Status:  (Pt has cognitive issues and needs repetitive cues)                       Memory: Decreased short-term memory Following Commands: Follows one step commands inconsistently;Follows one step commands with increased time Safety/Judgement: Decreased awareness of safety;Decreased awareness of deficits Awareness: Emergent Problem Solving: Slow processing;Difficulty sequencing        Exercises Other Exercises Other Exercises: warm up hip/knee ROM exercise with graded resistance x10    General Comments        Pertinent Vitals/Pain Pain Assessment: Faces Faces Pain Scale: No hurt    Home Living                      Prior Function            PT Goals (current goals can now be found in the care plan section) Acute Rehab PT Goals PT Goal Formulation: Patient unable to participate in goal setting Time For Goal Achievement: 06/21/20 Potential to Achieve Goals:  Good    Frequency    Min 2X/week      PT Plan Current plan remains appropriate    Co-evaluation              AM-PAC PT "6 Clicks" Mobility   Outcome Measure  Help needed turning from your back to your side while in a flat bed without using bedrails?: A Lot Help needed moving from lying on your back to sitting on the side of a flat bed without using bedrails?: A Lot Help needed moving to and from a bed to a chair (including a wheelchair)?: A Little Help needed standing up from a chair using your arms (e.g., wheelchair or bedside  chair)?: A Lot Help needed to walk in hospital room?: A Little Help needed climbing 3-5 steps with a railing? : A Lot 6 Click Score: 14    End of Session   Activity Tolerance: Patient tolerated treatment well Patient left: in chair;with call bell/phone within reach;with chair alarm set   PT Visit Diagnosis: Unsteadiness on feet (R26.81);Other abnormalities of gait and mobility (R26.89);Muscle weakness (generalized) (M62.81)     Time: 8403-7543 PT Time Calculation (min) (ACUTE ONLY): 23 min  Charges:  $Gait Training: 8-22 mins $Therapeutic Activity: 8-22 mins                     06/12/2020  Ginger Carne., PT Acute Rehabilitation Services [phone]  (pager) [phone]  (office)   Andrew [name] 06/12/2020, 7:36 PM

## 2020-06-12 NOTE — TOC Transition Note (Signed)
Transition of Care [facility]) - CM/SW Discharge Note   Patient Details  Name: Andrew [name] MRN: [id number] Date of Birth: [birth year]  Transition of Care [facility]) CM/SW Contact:  Benard Halsted, LCSW Phone Number: 06/12/2020, 10:47 AM   Clinical Narrative:    Patient will DC to: [facility] Anticipated DC date: 06/12/20 Family notified: Spouse Transport by: Corey Harold    Per MD patient ready for DC to [facility]. RN, patient, patient's family, and facility notified of DC. Discharge Summary and FL2 sent to facility. RN to call report prior to discharge [phone] Room 3250). DC packet on chart. Ambulance transport requested for patient.   CSW will sign off for now as social work intervention is no longer needed. Please consult [place] again if new needs arise.      Final next level of care: Skilled Nursing Facility Barriers to Discharge: No Barriers Identified   Patient Goals and CMS Choice Patient states their goals for this hospitalization and ongoing recovery are:: Rehab CMS Medicare.gov Compare Post Acute Care list provided to:: Patient Represenative (must comment) (Spouse) Choice offered to / list presented to : Patient, Spouse  Discharge Placement   Existing PASRR number confirmed : 06/12/20          Patient chooses bed at: [facility] Patient to be transferred to facility by: [facility] Name of family member notified: Spouse Patient and family notified of of transfer: 06/12/20  Discharge Plan and Services In-house Referral: Clinical Social Work Discharge Planning Services: CM Consult                                 Social Determinants of Health (Atlas) Interventions     Readmission Risk Interventions Readmission Risk Prevention Plan 06/08/2020  Transportation Screening Complete  PCP or Specialist Appt within 3-5 Days Not Complete  Not Complete comments disposition pending- likely SNF  [place] or [place] Complete  Social Work Consult for  [place] Planning/Counseling Complete  Palliative Care Screening Not Applicable  Medication Review Press photographer) Referral to Pharmacy

## 2020-06-12 NOTE — Discharge Summary (Addendum)
Physician Discharge Summary  Andrew [name] DZH:[id number] DOB: March 28, 1928 DOA: 06/02/2020  PCP: Patient, No Pcp Per  Admit date: 06/02/2020 Discharge date: 06/12/2020  Admitted From: Home Disposition:  SNF  Recommendations for Outpatient Follow-up:  1. Follow up with PCP in 1-2 weeks 2. Urogenital involved skilled nursing facility temporarily.   Home Health:No Equipment/Devices:None  Discharge Condition:Stable CODE STATUS:Full Diet recommendation: Heart Healthy  Brief/Interim Summary: 86 y.o. male past medical history of essential hypertension, hyperlipidemia, paroxysmal atrial fibrillation on Eliquis  Discharge Diagnoses:  Principal Problem:   Acute respiratory failure with hypercapnia ([place]) Active Problems:   Acute metabolic encephalopathy   CKD (chronic kidney disease), stage IV (HCC)   PAF (paroxysmal atrial fibrillation) (HCC)   Hypothyroidism   Chronic anticoagulation  Acute respiratory failure with hypercapnia secondary to COPD exacerbation: He was placed on BiPAP IV Solu-Medrol inhalers and antibiotics on admission he was weaned off the BiPAP. He will continue steroid taper as an outpatient has completed his course of antibiotics in house.  He will still go on 1 L of oxygen as he desaturates with ambulation.  Aspiration pneumonia: Speech was consulted and they recommended a dysphagia 1 diet he completed his course of antibiotics in house.  Chronic systolic heart failure: He appears euvolemic no change made to his medication.  Essential hypertension: Continue his current regimen no changes made.  Paroxysmal atrial fibrillation: Early sinus rhythm rate control on amiodarone continue Eliquis as an outpatient.  Hyperkalemia: Repleted orally now resolved.  Normocytic anemia/occult GI bleed: His hemoglobin dropped, FOBT was positive Eliquis was held he was started on Protonix GI was consulted perform an EGD that showed Barrett's esophagus candidiasis of the  gastritis. The recommended to continue the Protonix and restart Eliquis.  He will continue this as an outpatient and follow-up with GI in 2 to 4 weeks.  Discharge Instructions  Discharge Instructions    Diet - low sodium heart healthy   Complete by: As directed    Increase activity slowly   Complete by: As directed    No wound care   Complete by: As directed      Allergies as of 06/12/2020   No Known Allergies     Medication List    STOP taking these medications   cephALEXin 500 MG capsule Commonly known as: KEFLEX   cilostazol 100 MG tablet Commonly known as: PLETAL   gabapentin 100 MG capsule Commonly known as: NEURONTIN     TAKE these medications   acetaminophen 500 MG tablet Commonly known as: TYLENOL Take 1,000 mg by mouth every 6 (six) hours as needed for moderate pain or headache.   amiodarone 200 MG tablet Commonly known as: PACERONE Take 100 mg by mouth daily.   atorvastatin 80 MG tablet Commonly known as: LIPITOR Take 80 mg by mouth daily.   baclofen 10 MG tablet Commonly known as: LIORESAL Take 10 mg by mouth daily as needed for muscle spasms.   carvedilol 12.5 MG tablet Commonly known as: COREG Take 12.5 mg by mouth 2 (two) times daily.   Eliquis 2.5 MG Tabs tablet Generic drug: apixaban Take 2.5 mg by mouth 2 (two) times daily.   ezetimibe 10 MG tablet Commonly known as: ZETIA Take 10 mg by mouth daily.   fluconazole 100 MG tablet Commonly known as: DIFLUCAN Take 1 tablet (100 mg total) by mouth daily. Start taking on: June 13, 2020   FLUoxetine 20 MG capsule Commonly known as: PROZAC Take 20 mg by mouth daily.  furosemide 40 MG tablet Commonly known as: LASIX Take 1 tablet (40 mg total) by mouth 2 (two) times daily. What changed:   medication strength  how much to take  additional instructions   hydrALAZINE 50 MG tablet Commonly known as: APRESOLINE Take 50 mg by mouth 3 (three) times daily.   isosorbide mononitrate 30  MG 24 hr tablet Commonly known as: IMDUR Take 30 mg by mouth daily.   levothyroxine 100 MCG tablet Commonly known as: SYNTHROID Take 100 mcg by mouth daily.   Multivitamin Adults 50+ Tabs Take 1 tablet by mouth daily.   pantoprazole 40 MG tablet Commonly known as: PROTONIX Take 1 tablet (40 mg total) by mouth 2 (two) times daily.   potassium chloride SA 20 MEQ tablet Commonly known as: KLOR-CON Take 20 mEq by mouth daily.   predniSONE 10 MG tablet Commonly known as: DELTASONE Takes  3 tablets for 1 days, then 2 tabs for 1 days, then 1 tab for 1 days, and then stop.   temazepam 30 MG capsule Commonly known as: RESTORIL Take 30 mg by mouth at bedtime as needed for sleep.   tiotropium 18 MCG inhalation capsule Commonly known as: SPIRIVA Place 18 mcg into inhaler and inhale daily as needed (sob/wheezing).       Contact information for follow-up providers    Home, Medi Follow up.   Why: For home health services. They will call you in 1-2 days after discharge to set up your first home appointment.  Contact information: [place] [address] [phone]            Contact information for after-discharge care    Destination    [facility] .   Service: Skilled Nursing Contact information: [place] [phone]                 No Known Allergies  Consultations:  Gastroenterology   Procedures/Studies: CT Head Wo Contrast  Result Date: 06/02/2020 CLINICAL DATA:  86 year old male with history of posttraumatic headache after a fall. Three intra brazier in the right fronto parietal area. Pain in the back. EXAM: CT HEAD WITHOUT CONTRAST CT CERVICAL SPINE WITHOUT CONTRAST TECHNIQUE: Multidetector CT imaging of the head and cervical spine was performed following the standard protocol without intravenous contrast. Multiplanar CT image reconstructions of the cervical spine  were also generated. COMPARISON:  Head CT 07/02/2019. FINDINGS: CT HEAD FINDINGS Brain: Moderate cerebral atrophy. Patchy and confluent areas of decreased attenuation are noted throughout the deep and periventricular white matter of the cerebral hemispheres bilaterally, compatible with chronic microvascular ischemic disease. No evidence of acute infarction, hemorrhage, hydrocephalus, extra-axial collection or mass lesion/mass effect. Vascular: No hyperdense vessel or unexpected calcification. Skull: Normal. Negative for fracture or focal lesion. Sinuses/Orbits: No acute finding. Other: None. CT CERVICAL SPINE FINDINGS Alignment: 3 mm of anterolisthesis of C3 upon C4, likely chronic and degenerative. Straightening of the normal cervical lordosis, likely chronic. Alignment is otherwise anatomic. Skull base and vertebrae: No acute fracture. No primary bone lesion or focal pathologic process. Extensive anterior osteophytosis at the level of C4-C6. Soft tissues and spinal canal: No prevertebral fluid or swelling. No visible canal hematoma. Disc levels: Multilevel degenerative disc disease, and multilevel facet arthropathy. Upper chest: Extensive aortic atherosclerosis. Status post median sternotomy. Other: None. IMPRESSION: 1. No evidence of significant acute traumatic injury to the skull, brain or cervical spine. 2. Moderate cerebral atrophy with extensive chronic microvascular ischemic changes  in cerebral white matter, as above. 3. Multilevel degenerative disc disease and cervical spondylosis, as above. Electronically Signed   By: Vinnie Langton M.D.   On: 06/02/2020 12:39   CT Cervical Spine Wo Contrast  Result Date: 06/02/2020 CLINICAL DATA:  86 year old male with history of posttraumatic headache after a fall. Three intra brazier in the right fronto parietal area. Pain in the back. EXAM: CT HEAD WITHOUT CONTRAST CT CERVICAL SPINE WITHOUT CONTRAST TECHNIQUE: Multidetector CT imaging of the head and cervical  spine was performed following the standard protocol without intravenous contrast. Multiplanar CT image reconstructions of the cervical spine were also generated. COMPARISON:  Head CT 07/02/2019. FINDINGS: CT HEAD FINDINGS Brain: Moderate cerebral atrophy. Patchy and confluent areas of decreased attenuation are noted throughout the deep and periventricular white matter of the cerebral hemispheres bilaterally, compatible with chronic microvascular ischemic disease. No evidence of acute infarction, hemorrhage, hydrocephalus, extra-axial collection or mass lesion/mass effect. Vascular: No hyperdense vessel or unexpected calcification. Skull: Normal. Negative for fracture or focal lesion. Sinuses/Orbits: No acute finding. Other: None. CT CERVICAL SPINE FINDINGS Alignment: 3 mm of anterolisthesis of C3 upon C4, likely chronic and degenerative. Straightening of the normal cervical lordosis, likely chronic. Alignment is otherwise anatomic. Skull base and vertebrae: No acute fracture. No primary bone lesion or focal pathologic process. Extensive anterior osteophytosis at the level of C4-C6. Soft tissues and spinal canal: No prevertebral fluid or swelling. No visible canal hematoma. Disc levels: Multilevel degenerative disc disease, and multilevel facet arthropathy. Upper chest: Extensive aortic atherosclerosis. Status post median sternotomy. Other: None. IMPRESSION: 1. No evidence of significant acute traumatic injury to the skull, brain or cervical spine. 2. Moderate cerebral atrophy with extensive chronic microvascular ischemic changes in cerebral white matter, as above. 3. Multilevel degenerative disc disease and cervical spondylosis, as above. Electronically Signed   By: Vinnie Langton M.D.   On: 06/02/2020 12:39   DG Pelvis Portable  Result Date: 06/02/2020 CLINICAL DATA:  Fall with pelvic pain EXAM: PORTABLE PELVIS 1-2 VIEWS COMPARISON:  None. FINDINGS: Pelvic ring is intact. Degenerative changes of the hip joints  and lumbar spine are seen. Diffuse vascular calcifications are noted. No soft tissue abnormality is noted. IMPRESSION: Chronic changes without acute abnormality Electronically Signed   By: Inez Catalina M.D.   On: 06/02/2020 11:54   DG CHEST PORT 1 VIEW  Result Date: 06/04/2020 CLINICAL DATA:  86 year old male with aspiration. EXAM: PORTABLE CHEST 1 VIEW COMPARISON:  Portable chest 06/02/2020 and earlier. FINDINGS: Portable AP supine view at 2306 hours. Confluent new bilateral upper lobe opacity worse on the right. Continued low lung volumes. Stable cardiac size and mediastinal contours. Calcified aortic atherosclerosis. Prior sternotomy. Grossly negative trachea. No pneumothorax or pleural effusion evident on this supine view. No acute osseous abnormality identified. Negative visible bowel gas pattern. IMPRESSION: New right greater than left upper lobe opacity since 06/02/2020 compatible with aspiration pneumonia. No pleural effusion is evident. Electronically Signed   By: Genevie Ann M.D.   On: 06/04/2020 23:29   DG Chest Portable 1 View  Result Date: 06/02/2020 CLINICAL DATA:  Fall EXAM: PORTABLE CHEST 1 VIEW COMPARISON:  None. FINDINGS: Prior CABG. Heart is borderline in size. Bibasilar atelectasis. No effusions or pneumothorax. No acute bony abnormality. IMPRESSION: Bibasilar atelectasis. Electronically Signed   By: Rolm Baptise M.D.   On: 06/02/2020 11:53   DG Swallowing Func-Speech Pathology  Result Date: 06/05/2020 Objective Swallowing Evaluation: Type of Study: MBS-Modified Barium Swallow Study  Patient Details Name: Spike  EMIDIO WARRELL MRN: [id number] Date of Birth: [birth year] Today's Date: 06/05/2020 Time: SLP Start Time (ACUTE ONLY): 1300 -SLP Stop Time (ACUTE ONLY): 1330 SLP Time Calculation (min) (ACUTE ONLY): 30 min Past Medical History: Past Medical History: Diagnosis Date . CHF (congestive heart failure) (HCC)  Past Surgical History: HPI: 86 year old male with a history of hypertension,  hyperlipidemia, CHF, paroxysmal atrial fibrillation on Eliquis, carotid stenosis, AAA presented to the ED after a fall at home.  Apparently patient got from his chair felt dizzy and fell hitting his head.  Patient was found to be in acute respiratory failure with hypercapnia, started on BiPAP.  ABG showed PCO2 of 73.7.  Pt evalauted by SLP on 6/10 with appearance of normal swallow. Overnight RN concerned for aspriation given rhonchorous breath sounds after drinking water so MBS ordered.  No data recorded Assessment / Plan / Recommendation CHL IP CLINICAL IMPRESSIONS 06/05/2020 Clinical Impression Pt demonstrates a mild pharyngeal dysphagia with instances of flash and trace penetration with sensation. Pt was lethargic during testing with wet upper airway sounds. Despite this, he was attentive to intake and constantly begging to drink more. He consumed rapid consecutive straw sips with sustained laryngeal closure, but instances of penetration of barium into the vestibule during or after the swallow, that was ejected post swallow by squeeze or by slight throat clear. Suspect standing secretions based on pts airway sounds and appearance on fluoro. Pt did have the appearance of a large bony protrusion at C5/6 right at the UES, that contributed to mild backflow of barium to the pyriforms. Esophageal sweep also reveal stasis, which could also be contributing. Recommend pt continue to have access to water and thin liquids. He may benefit from a break from food given concern for potential reflux or backflow, until he is better alert and sitting up well for meals. Will f/u for tolerance given MD concern regarding aspiration.  SLP Visit Diagnosis Dysphagia, oropharyngeal phase (R13.12) Attention and concentration deficit following -- Frontal lobe and executive function deficit following -- Impact on safety and function Mild aspiration risk   CHL IP TREATMENT RECOMMENDATION 06/05/2020 Treatment Recommendations Therapy as outlined  in treatment plan below   No flowsheet data found. CHL IP DIET RECOMMENDATION 06/05/2020 SLP Diet Recommendations Thin liquid Liquid Administration via Cup;Straw Medication Administration Crushed with puree Compensations Slow rate;Small sips/bites Postural Changes Remain semi-upright after after feeds/meals (Comment);Seated upright at 90 degrees   CHL IP OTHER RECOMMENDATIONS 06/05/2020 Recommended Consults -- Oral Care Recommendations Oral care BID Other Recommendations --   CHL IP FOLLOW UP RECOMMENDATIONS 06/05/2020 Follow up Recommendations 24 hour supervision/assistance   CHL IP FREQUENCY AND DURATION 06/05/2020 Speech Therapy Frequency (ACUTE ONLY) min 2x/week Treatment Duration 2 weeks      No flowsheet data found. No flowsheet data found. No flowsheet data found. Herbie [place], MA CCC-SLP Acute Rehabilitation Services Pager [phone] Office [phone] Lynann Beaver 06/05/2020, 2:25 PM               Subjective: No complaints feels great.  Discharge Exam: Vitals:   06/12/20 0800 06/12/20 0932  BP: (!) 163/71 (!) 161/87  Pulse: (!) 53 (!) 57  Resp: 16   Temp: 97.8 F (36.6 C)   SpO2: 94%    Vitals:   06/12/20 0019 06/12/20 0436 06/12/20 0800 06/12/20 0932  BP:   (!) 163/71 (!) 161/87  Pulse:   (!) 53 (!) 57  Resp:   16   Temp: 99.1 F (37.3 C) 98.4 F (36.9 C) 97.8  F (36.6 C)   TempSrc: Oral Oral Axillary   SpO2:   94%   Weight:      Height:        General: Pt is alert, awake, not in acute distress Cardiovascular: RRR, S1/S2 +, no rubs, no gallops Respiratory: CTA bilaterally, no wheezing, no rhonchi Abdominal: Soft, NT, ND, bowel sounds + Extremities: no edema, no cyanosis    The results of significant diagnostics from this hospitalization (including imaging, microbiology, ancillary and laboratory) are listed below for reference.     Microbiology: Recent Results (from the past 240 hour(s))  SARS Coronavirus 2 by RT PCR (hospital order, performed in [facility] hospital lab) Nasopharyngeal Nasopharyngeal Swab     Status: None   Collection Time: 06/02/20 12:56 PM   Specimen: Nasopharyngeal Swab  Result Value Ref Range Status   SARS Coronavirus 2 NEGATIVE NEGATIVE Final    Comment: (NOTE) SARS-CoV-2 target nucleic acids are NOT DETECTED. The SARS-CoV-2 RNA is generally detectable in upper and lower respiratory specimens during the acute phase of infection. The lowest concentration of SARS-CoV-2 viral copies this assay can detect is 250 copies / mL. A negative result does not preclude SARS-CoV-2 infection and should not be used as the sole basis for treatment or other patient management decisions.  A negative result may occur with improper specimen collection / handling, submission of specimen other than nasopharyngeal swab, presence of viral mutation(s) within the areas targeted by this assay, and inadequate number of viral copies (<250 copies / mL). A negative result must be combined with clinical observations, patient history, and epidemiological information. Fact Sheet for Patients:   StrictlyIdeas.no Fact Sheet for Healthcare Providers: BankingDealers.co.za This test is not yet approved or cleared  by the [place] FDA and has been authorized for detection and/or diagnosis of SARS-CoV-2 by FDA under an Emergency Use Authorization (EUA).  This EUA will remain in effect (meaning this test can be used) for the duration of the COVID-19 declaration under Section 564(b)(1) of the Act, 21 U.S.C. section 360bbb-3(b)(1), unless the authorization is terminated or revoked sooner. Performed at [facility], [place] [address], [place], [place] [address]   Culture, blood (routine x 2)     Status: None   Collection Time: 06/02/20 10:04 PM   Specimen: BLOOD LEFT WRIST  Result Value Ref Range Status   Specimen Description BLOOD LEFT WRIST  Final   Special Requests   Final    BOTTLES DRAWN  AEROBIC AND ANAEROBIC Blood Culture results may not be optimal due to an inadequate volume of blood received in culture bottles   Culture   Final    NO GROWTH 5 DAYS Performed at [facility], [place] [address], [place], [place] [address]    Report Status 06/07/2020 FINAL  Final  Culture, blood (routine x 2)     Status: None   Collection Time: 06/02/20 10:04 PM   Specimen: BLOOD  Result Value Ref Range Status   Specimen Description BLOOD  Final   Special Requests   Final    LEFT KNUCKLE BOTTLES DRAWN AEROBIC AND ANAEROBIC Blood Culture results may not be optimal due to an inadequate volume of blood received in culture bottles   Culture   Final    NO GROWTH 5 DAYS Performed at [facility], [place] [address] [place], [place] [address]    Report Status 06/07/2020 FINAL  Final  MRSA PCR Screening     Status: None  Collection Time: 06/03/20 10:57 PM   Specimen: Nasal Mucosa; Nasopharyngeal  Result Value Ref Range Status   MRSA by PCR NEGATIVE NEGATIVE Final    Comment:        The GeneXpert MRSA Assay (FDA approved for NASAL specimens only), is one component of a comprehensive MRSA colonization surveillance program. It is not intended to diagnose MRSA infection nor to guide or monitor treatment for MRSA infections. Performed at [facility], [place] [address], [place], [place] [address]      Labs: BNP (last 3 results) Recent Labs    06/03/20 0604  BNP 0,263.7*   Basic Metabolic Panel: Recent Labs  Lab 06/06/20 0301 06/07/20 0516 06/08/20 0312 06/09/20 0613 06/10/20 0355 06/11/20 1118  NA 145 141 140 138 142  --   K 3.6 3.3* 3.8 3.7 3.7  --   CL 106 102 105 102 104  --   CO2 24 26 26 26 28   --   GLUCOSE 210* 212* 245* 286* 278*  --   BUN 60* 64* 75* 64* 59*  --   CREATININE 1.83* 1.78* 1.96* 1.74* 1.81*  --   CALCIUM 8.7* 8.4* 8.3* 8.3* 8.5*  --   MG  --   --   --   --   --  2.0   Liver Function Tests: No results for input(s): AST, ALT, ALKPHOS,  BILITOT, PROT, ALBUMIN in the last 168 hours. No results for input(s): LIPASE, AMYLASE in the last 168 hours. No results for input(s): AMMONIA in the last 168 hours. CBC: Recent Labs  Lab 06/06/20 0301 06/08/20 0312 06/09/20 0613 06/10/20 0355  WBC 8.1 9.2 10.0 10.1  HGB 12.9* 11.3* 11.5* 11.4*  HCT 39.6 33.3* 34.8* 34.0*  MCV 102.1* 100.3* 101.2* 100.6*  PLT 147* 142* 135* 137*   Cardiac Enzymes: No results for input(s): CKTOTAL, CKMB, CKMBINDEX, TROPONINI in the last 168 hours. BNP: Invalid input(s): POCBNP CBG: No results for input(s): GLUCAP in the last 168 hours. D-Dimer No results for input(s): DDIMER in the last 72 hours. Hgb A1c No results for input(s): HGBA1C in the last 72 hours. Lipid Profile No results for input(s): CHOL, HDL, LDLCALC, TRIG, CHOLHDL, LDLDIRECT in the last 72 hours. Thyroid function studies No results for input(s): TSH, T4TOTAL, T3FREE, THYROIDAB in the last 72 hours.  Invalid input(s): FREET3 Anemia work up No results for input(s): VITAMINB12, FOLATE, FERRITIN, TIBC, IRON, RETICCTPCT in the last 72 hours. Urinalysis    Component Value Date/Time   COLORURINE YELLOW 06/02/2020 1121   APPEARANCEUR CLEAR 06/02/2020 1121   LABSPEC 1.009 06/02/2020 1121   PHURINE 7.0 06/02/2020 1121   GLUCOSEU NEGATIVE 06/02/2020 1121   HGBUR SMALL (A) 06/02/2020 1121   BILIRUBINUR NEGATIVE 06/02/2020 1121   KETONESUR NEGATIVE 06/02/2020 1121   PROTEINUR 100 (A) 06/02/2020 1121   NITRITE NEGATIVE 06/02/2020 1121   LEUKOCYTESUR NEGATIVE 06/02/2020 1121   Sepsis Labs Invalid input(s): PROCALCITONIN,  WBC,  LACTICIDVEN Microbiology Recent Results (from the past 240 hour(s))  SARS Coronavirus 2 by RT PCR (hospital order, performed in [place] hospital lab) Nasopharyngeal Nasopharyngeal Swab     Status: None   Collection Time: 06/02/20 12:56 PM   Specimen: Nasopharyngeal Swab  Result Value Ref Range Status   SARS Coronavirus 2 NEGATIVE NEGATIVE Final     Comment: (NOTE) SARS-CoV-2 target nucleic acids are NOT DETECTED. The SARS-CoV-2 RNA is generally detectable in upper and lower respiratory specimens during the acute phase of infection. The lowest concentration of SARS-CoV-2  viral copies this assay can detect is 250 copies / mL. A negative result does not preclude SARS-CoV-2 infection and should not be used as the sole basis for treatment or other patient management decisions.  A negative result may occur with improper specimen collection / handling, submission of specimen other than nasopharyngeal swab, presence of viral mutation(s) within the areas targeted by this assay, and inadequate number of viral copies (<250 copies / mL). A negative result must be combined with clinical observations, patient history, and epidemiological information. Fact Sheet for Patients:   StrictlyIdeas.no Fact Sheet for Healthcare Providers: BankingDealers.co.za This test is not yet approved or cleared  by the [place] FDA and has been authorized for detection and/or diagnosis of SARS-CoV-2 by FDA under an Emergency Use Authorization (EUA).  This EUA will remain in effect (meaning this test can be used) for the duration of the COVID-19 declaration under Section 564(b)(1) of the Act, 21 U.S.C. section 360bbb-3(b)(1), unless the authorization is terminated or revoked sooner. Performed at [facility], [place] [address], [place], [place] [address]   Culture, blood (routine x 2)     Status: None   Collection Time: 06/02/20 10:04 PM   Specimen: BLOOD LEFT WRIST  Result Value Ref Range Status   Specimen Description BLOOD LEFT WRIST  Final   Special Requests   Final    BOTTLES DRAWN AEROBIC AND ANAEROBIC Blood Culture results may not be optimal due to an inadequate volume of blood received in culture bottles   Culture   Final    NO GROWTH 5 DAYS Performed at [facility], [place] [address],  [place], [place] [address]    Report Status 06/07/2020 FINAL  Final  Culture, blood (routine x 2)     Status: None   Collection Time: 06/02/20 10:04 PM   Specimen: BLOOD  Result Value Ref Range Status   Specimen Description BLOOD  Final   Special Requests   Final    LEFT KNUCKLE BOTTLES DRAWN AEROBIC AND ANAEROBIC Blood Culture results may not be optimal due to an inadequate volume of blood received in culture bottles   Culture   Final    NO GROWTH 5 DAYS Performed at [facility], [place] [address] [place], [place] [address]    Report Status 06/07/2020 FINAL  Final  MRSA PCR Screening     Status: None   Collection Time: 06/03/20 10:57 PM   Specimen: Nasal Mucosa; Nasopharyngeal  Result Value Ref Range Status   MRSA by PCR NEGATIVE NEGATIVE Final    Comment:        The GeneXpert MRSA Assay (FDA approved for NASAL specimens only), is one component of a comprehensive MRSA colonization surveillance program. It is not intended to diagnose MRSA infection nor to guide or monitor treatment for MRSA infections. Performed at [facility], [place] [place] [address], [place], [place] [address]      Time coordinating discharge: Over 30 minutes  SIGNED:   Charlynne Cousins, MD  [facility] 06/12/2020, 10:31 AM Pager   If 7PM-7AM, please contact night-coverage [email or web address] Password [email or web address]

## 2020-06-12 NOTE — Care Management Important Message (Signed)
Important Message  Patient Details  Name: Andrew [name] MRN: [id number] Date of Birth: [birth year]   Medicare Important Message Given:  Yes - Important Message mailed due to current National Emergency   Verbal consent obtained due to current National Emergency  Relationship to patient: Self Contact Name: Andrew [name] Call Date: 06/12/20  Time: 1222 Phone: [phone] Outcome: No Answer/Busy Important Message mailed to: Patient address on file    Delorse Lek 06/12/2020, 12:23 PM

## 2020-06-12 NOTE — Progress Notes (Signed)
[name] Font to be D/C'd [place] per MD order.  Discussed with the patient and all questions fully answered.  VSS, Skin clean, dry and intact without evidence of skin break down, no evidence of skin tears noted. IV catheter discontinued intact. Site without signs and symptoms of complications. Dressing and pressure applied.  An After Visit Summary was printed and given to the patient. Patient received prescription.  D/c education completed with patient/family including follow up instructions, medication list, d/c activities limitations if indicated, with other d/c instructions as indicated by MD - patient able to verbalize understanding, all questions fully answered.   Patient instructed to return to ED, call 911, or call MD for any changes in condition.   Patient escorted via [facility] to [place],  Report given to nurse cathelin at [facility] 06/12/2020 1:43 PM

## 2020-06-15 ENCOUNTER — Encounter: Payer: Self-pay | Admitting: Family Medicine

## 2020-06-15 DIAGNOSIS — E1122 Type 2 diabetes mellitus with diabetic chronic kidney disease: Secondary | ICD-10-CM | POA: Diagnosis not present

## 2020-06-15 DIAGNOSIS — I509 Heart failure, unspecified: Secondary | ICD-10-CM | POA: Diagnosis not present

## 2020-06-15 DIAGNOSIS — E782 Mixed hyperlipidemia: Secondary | ICD-10-CM | POA: Diagnosis not present

## 2020-06-15 DIAGNOSIS — F419 Anxiety disorder, unspecified: Secondary | ICD-10-CM | POA: Diagnosis not present

## 2020-06-15 DIAGNOSIS — I480 Paroxysmal atrial fibrillation: Secondary | ICD-10-CM | POA: Diagnosis not present

## 2020-06-15 DIAGNOSIS — F329 Major depressive disorder, single episode, unspecified: Secondary | ICD-10-CM | POA: Diagnosis not present

## 2020-06-15 DIAGNOSIS — E039 Hypothyroidism, unspecified: Secondary | ICD-10-CM | POA: Diagnosis not present

## 2020-06-15 DIAGNOSIS — D649 Anemia, unspecified: Secondary | ICD-10-CM | POA: Diagnosis not present

## 2020-06-15 DIAGNOSIS — I10 Essential (primary) hypertension: Secondary | ICD-10-CM | POA: Diagnosis not present

## 2020-06-19 DIAGNOSIS — J449 Chronic obstructive pulmonary disease, unspecified: Secondary | ICD-10-CM | POA: Diagnosis not present

## 2020-06-19 DIAGNOSIS — R531 Weakness: Secondary | ICD-10-CM | POA: Diagnosis not present

## 2020-06-19 DIAGNOSIS — F322 Major depressive disorder, single episode, severe without psychotic features: Secondary | ICD-10-CM | POA: Diagnosis not present

## 2020-06-19 DIAGNOSIS — I10 Essential (primary) hypertension: Secondary | ICD-10-CM | POA: Diagnosis not present

## 2020-06-19 DIAGNOSIS — I509 Heart failure, unspecified: Secondary | ICD-10-CM | POA: Diagnosis not present

## 2020-06-19 DIAGNOSIS — J690 Pneumonitis due to inhalation of food and vomit: Secondary | ICD-10-CM | POA: Diagnosis not present

## 2020-06-19 DIAGNOSIS — J441 Chronic obstructive pulmonary disease with (acute) exacerbation: Secondary | ICD-10-CM | POA: Diagnosis not present

## 2020-06-19 DIAGNOSIS — N184 Chronic kidney disease, stage 4 (severe): Secondary | ICD-10-CM | POA: Diagnosis not present

## 2020-06-19 DIAGNOSIS — J9621 Acute and chronic respiratory failure with hypoxia: Secondary | ICD-10-CM | POA: Diagnosis not present

## 2020-06-24 ENCOUNTER — Telehealth (HOSPITAL_COMMUNITY): Payer: Self-pay

## 2020-06-24 NOTE — Telephone Encounter (Signed)
I called Ms Childress to check on the status of mr Farrelly. She stated he is still at inpatient rehabilitation but is making improvements. She stated the staff advised he may be ready to come home by the beginning of next week but she has reservations and wants to wait longer. I asked that she call me when she believes he is going to come and she was agreeable. I will follow up with a phone call next week if I do not hear anything before then.   [name], EMT 06/24/20

## 2020-06-30 DIAGNOSIS — M1990 Unspecified osteoarthritis, unspecified site: Secondary | ICD-10-CM | POA: Diagnosis not present

## 2020-06-30 DIAGNOSIS — D649 Anemia, unspecified: Secondary | ICD-10-CM | POA: Diagnosis not present

## 2020-06-30 DIAGNOSIS — E1122 Type 2 diabetes mellitus with diabetic chronic kidney disease: Secondary | ICD-10-CM | POA: Diagnosis not present

## 2020-06-30 DIAGNOSIS — I10 Essential (primary) hypertension: Secondary | ICD-10-CM | POA: Diagnosis not present

## 2020-06-30 DIAGNOSIS — I480 Paroxysmal atrial fibrillation: Secondary | ICD-10-CM | POA: Diagnosis not present

## 2020-06-30 DIAGNOSIS — I509 Heart failure, unspecified: Secondary | ICD-10-CM | POA: Diagnosis not present

## 2020-06-30 DIAGNOSIS — E782 Mixed hyperlipidemia: Secondary | ICD-10-CM | POA: Diagnosis not present

## 2020-07-03 ENCOUNTER — Other Ambulatory Visit: Payer: Self-pay

## 2020-07-03 ENCOUNTER — Emergency Department (HOSPITAL_COMMUNITY): Payer: Medicare PPO

## 2020-07-03 ENCOUNTER — Emergency Department (HOSPITAL_COMMUNITY)
Admission: EM | Admit: 2020-07-03 | Discharge: 2020-07-03 | Disposition: A | Discharge status: Home / Self Care | Payer: Medicare PPO | Attending: Emergency Medicine | Admitting: Emergency Medicine

## 2020-07-03 DIAGNOSIS — M549 Dorsalgia, unspecified: Secondary | ICD-10-CM | POA: Insufficient documentation

## 2020-07-03 DIAGNOSIS — M1711 Unilateral primary osteoarthritis, right knee: Secondary | ICD-10-CM | POA: Diagnosis not present

## 2020-07-03 DIAGNOSIS — S80912A Unspecified superficial injury of left knee, initial encounter: Secondary | ICD-10-CM | POA: Diagnosis present

## 2020-07-03 DIAGNOSIS — Y92129 Unspecified place in nursing home as the place of occurrence of the external cause: Secondary | ICD-10-CM | POA: Diagnosis not present

## 2020-07-03 DIAGNOSIS — I700 Atherosclerosis of aorta: Secondary | ICD-10-CM | POA: Diagnosis not present

## 2020-07-03 DIAGNOSIS — Y999 Unspecified external cause status: Secondary | ICD-10-CM | POA: Insufficient documentation

## 2020-07-03 DIAGNOSIS — S8001XA Contusion of right knee, initial encounter: Secondary | ICD-10-CM | POA: Insufficient documentation

## 2020-07-03 DIAGNOSIS — W19XXXA Unspecified fall, initial encounter: Secondary | ICD-10-CM | POA: Diagnosis not present

## 2020-07-03 DIAGNOSIS — J441 Chronic obstructive pulmonary disease with (acute) exacerbation: Secondary | ICD-10-CM | POA: Diagnosis not present

## 2020-07-03 DIAGNOSIS — M160 Bilateral primary osteoarthritis of hip: Secondary | ICD-10-CM | POA: Diagnosis not present

## 2020-07-03 DIAGNOSIS — S8991XA Unspecified injury of right lower leg, initial encounter: Secondary | ICD-10-CM | POA: Diagnosis not present

## 2020-07-03 DIAGNOSIS — I739 Peripheral vascular disease, unspecified: Secondary | ICD-10-CM | POA: Diagnosis not present

## 2020-07-03 DIAGNOSIS — S199XXA Unspecified injury of neck, initial encounter: Secondary | ICD-10-CM | POA: Diagnosis not present

## 2020-07-03 DIAGNOSIS — M4319 Spondylolisthesis, multiple sites in spine: Secondary | ICD-10-CM | POA: Diagnosis not present

## 2020-07-03 DIAGNOSIS — I517 Cardiomegaly: Secondary | ICD-10-CM | POA: Diagnosis not present

## 2020-07-03 DIAGNOSIS — M4186 Other forms of scoliosis, lumbar region: Secondary | ICD-10-CM | POA: Diagnosis not present

## 2020-07-03 DIAGNOSIS — S3993XA Unspecified injury of pelvis, initial encounter: Secondary | ICD-10-CM | POA: Diagnosis not present

## 2020-07-03 DIAGNOSIS — J449 Chronic obstructive pulmonary disease, unspecified: Secondary | ICD-10-CM | POA: Diagnosis not present

## 2020-07-03 DIAGNOSIS — S3992XA Unspecified injury of lower back, initial encounter: Secondary | ICD-10-CM | POA: Diagnosis not present

## 2020-07-03 DIAGNOSIS — J9621 Acute and chronic respiratory failure with hypoxia: Secondary | ICD-10-CM | POA: Diagnosis not present

## 2020-07-03 DIAGNOSIS — I509 Heart failure, unspecified: Secondary | ICD-10-CM | POA: Diagnosis not present

## 2020-07-03 DIAGNOSIS — Y939 Activity, unspecified: Secondary | ICD-10-CM | POA: Insufficient documentation

## 2020-07-03 DIAGNOSIS — F0390 Unspecified dementia without behavioral disturbance: Secondary | ICD-10-CM | POA: Insufficient documentation

## 2020-07-03 DIAGNOSIS — S0990XA Unspecified injury of head, initial encounter: Secondary | ICD-10-CM | POA: Diagnosis not present

## 2020-07-03 DIAGNOSIS — S0003XA Contusion of scalp, initial encounter: Secondary | ICD-10-CM

## 2020-07-03 DIAGNOSIS — J9811 Atelectasis: Secondary | ICD-10-CM | POA: Diagnosis not present

## 2020-07-03 NOTE — ED Triage Notes (Signed)
Patient from [facility] because of a fall on tile floor, head lac to right side of head and laceration to the right knee. On eliquis  150/70 80HR 16 RR 93% on RA

## 2020-07-03 NOTE — ED Notes (Signed)
[facility] called for transport.  

## 2020-07-03 NOTE — ED Provider Notes (Signed)
[facility] EMERGENCY DEPARTMENT Provider Note   CSN: [id number] Arrival date & time: 07/03/20  0351     History Chief Complaint  Patient presents with  . Fall    Level 2    Andrew [name] is a 86 y.o. male.  Patient presents to the emergency department for evaluation after an unwitnessed fall.  Patient comes to the ER from [facility].  He has transported by EMS.  He has been complaining of right knee pain since the fall.  Patient is chronically on blood thinners and does have an abrasion and contusion to the right side of his head.  He denies headache, neck pain.  He has noticed some low back pain when he moves.        No past medical history on file.  There are no problems to display for this patient.        No family history on file.  Social History   Tobacco Use  . Smoking status: Not on file  Substance Use Topics  . Alcohol use: Not on file  . Drug use: Not on file    Home Medications Prior to Admission medications   Medication Sig Start Date End Date Taking? Authorizing Provider  acetaminophen (TYLENOL) 500 MG tablet Take 1,000 mg by mouth every 6 (six) hours as needed for mild pain.   Yes [provider]  amiodarone (PACERONE) 200 MG tablet Take 100 mg by mouth daily. 02/27/20  Yes [provider]  apixaban (ELIQUIS) 2.5 MG TABS tablet Take 2.5 mg by mouth 2 (two) times daily.   Yes [provider]  atorvastatin (LIPITOR) 80 MG tablet Take 80 mg by mouth daily. 05/08/20  Yes [provider]  baclofen (LIORESAL) 10 MG tablet Take 10 mg by mouth daily as needed for muscle spasms.  06/01/20  Yes [provider]  carvedilol (COREG) 12.5 MG tablet Take 12.5 mg by mouth 2 (two) times daily with a meal.   Yes [provider]  cilostazol (PLETAL) 100 MG tablet Take 100 mg by mouth 2 (two) times daily.   Yes [provider]  ezetimibe (ZETIA) 10 MG tablet Take 10 mg by mouth  daily. 02/28/20  Yes [provider]  FLUoxetine (PROZAC) 20 MG capsule Take 20 mg by mouth daily. 02/28/20  Yes [provider]  furosemide (LASIX) 80 MG tablet Take 80-120 mg by mouth See admin instructions. Take 1 and 1/2 tablets every morning then take 1 tablet in the afternoon 05/21/20  Yes [provider]  gabapentin (NEURONTIN) 100 MG capsule Take 100 mg by mouth 3 (three) times daily.   Yes [provider]  hydrALAZINE (APRESOLINE) 50 MG tablet Take 50 mg by mouth 3 (three) times daily. 03/26/20  Yes [provider]  [facility] EX) Apply topically in the morning and at bedtime. (Ointment) To buttock/scrotum   Yes [provider]  isosorbide mononitrate (IMDUR) 30 MG 24 hr tablet Take 30 mg by mouth daily. 02/27/20  Yes [provider]  levothyroxine (SYNTHROID) 100 MCG tablet Take 100 mcg by mouth daily. 04/24/20  Yes [provider]  Multiple Vitamin (MULTIVITAMIN WITH MINERALS) TABS tablet Take 1 tablet by mouth daily.   Yes [provider]  pantoprazole (PROTONIX) 40 MG tablet Take 40 mg by mouth 2 (two) times daily.   Yes [provider]  potassium chloride SA (KLOR-CON) 20 MEQ tablet Take 20 mEq by mouth daily. 02/27/20  Yes [provider]  predniSONE (DELTASONE) 1 MG tablet Take 5 mg by mouth daily with breakfast.   Yes [provider]  temazepam (RESTORIL) 30 MG capsule Take 30 mg by mouth at bedtime as needed for sleep.   Yes [provider]  tiotropium (SPIRIVA) 18 MCG inhalation capsule Place 18 mcg into inhaler and inhale daily as needed (for wheezing).   Yes [provider]    Allergies    Patient has no known allergies.  Review of Systems   Review of Systems  Musculoskeletal: Positive for arthralgias and back pain.  All other systems reviewed and are negative.   Physical Exam Updated Vital Signs BP (!) 128/58   Pulse 63   Temp 97.8  F (36.6 C) (Oral)   Resp 17   Ht 5\' 7"  (1.702 m)   Wt 81.6 kg   SpO2 93%   BMI 28.19 kg/m   Physical Exam Vitals and nursing note reviewed.  Constitutional:      General: He is not in acute distress.    Appearance: Normal appearance. He is well-developed.  HENT:     Head: Normocephalic. Abrasion and contusion present.      Right Ear: Hearing normal.     Left Ear: Hearing normal.     Nose: Nose normal.  Eyes:     Conjunctiva/sclera: Conjunctivae normal.     Pupils: Pupils are equal, round, and reactive to light.  Cardiovascular:     Rate and Rhythm: Regular rhythm.     Heart sounds: S1 normal and S2 normal. No murmur heard.  No friction rub. No gallop.   Pulmonary:     Effort: Pulmonary effort is normal. No respiratory distress.     Breath sounds: Normal breath sounds.  Chest:     Chest wall: No tenderness.  Abdominal:     General: Bowel sounds are normal.     Palpations: Abdomen is soft.     Tenderness: There is no abdominal tenderness. There is no guarding or rebound. Negative signs include Murphy's sign and McBurney's sign.     Hernia: No hernia is present.  Musculoskeletal:        General: Normal range of motion.     Cervical back: Normal, normal range of motion and neck supple.     Thoracic back: Normal.     Lumbar back: Tenderness present.     Right hip: Normal.     Left hip: Normal.     Right knee: Swelling present. No deformity. Tenderness present.  Skin:    General: Skin is warm and dry.     Findings: No rash. Abrasion: right parietal scalp, right knee.  Neurological:     Mental Status: He is alert. Mental status is at baseline.     GCS: GCS eye subscore is 4. GCS verbal subscore is 4. GCS motor subscore is 6.     Cranial Nerves: No cranial nerve deficit.     Sensory: No sensory deficit.     Coordination: Coordination normal.  Psychiatric:        Speech: Speech normal.        Behavior: Behavior normal.        Thought Content: Thought content normal.       ED Results / Procedures / Treatments   Labs (all labs ordered are listed, but only abnormal results are displayed) Labs Reviewed - No data to display  EKG None  Radiology DG Lumbar Spine Complete  Result Date: 07/03/2020 CLINICAL DATA:  Fall. EXAM: LUMBAR SPINE - COMPLETE 4+ VIEW COMPARISON:  07/02/2019. FINDINGS: Lumbar spine numbered the lowest segmented appearing lumbar shaped vertebrae on lateral view as L5. Lumbar spine scoliosis concave right. Diffuse severe multilevel degenerative change again noted. Stable 7 mm retrolisthesis L2 on L3 with stable mild deformity of the anterior L2 vertebral body. Stable grade 2 spondylolisthesis L5 on S1 secondary to bilateral pars defects at L5. No acute abnormality identified. Aortoiliac atherosclerotic vascular calcification. Surgical clips in the upper abdomen. IMPRESSION: 1. Lumbar spine scoliosis concave right. Diffuse severe multilevel degenerative change again noted. Stable 7 mm retrolisthesis L2 on L3 with stable mild deformity of the anterior L2 vertebral body. Stable grade 2 spondylolisthesis L5 on S1 secondary to bilateral pars defects at L5. 2.  No acute bony abnormality identified. 3.  Aortoiliac atherosclerotic vascular disease. Electronically Signed   By: Marcello Moores  Register   On: 07/03/2020 05:20   CT HEAD WO CONTRAST  Result Date: 07/03/2020 CLINICAL DATA:  Fall with head neck injury.  Blood thinner EXAM: CT HEAD WITHOUT CONTRAST CT CERVICAL SPINE WITHOUT CONTRAST TECHNIQUE: Multidetector CT imaging of the head and cervical spine was performed following the standard protocol without intravenous contrast. Multiplanar CT image reconstructions of the cervical spine were also generated. COMPARISON:  06/02/2020 FINDINGS: CT HEAD FINDINGS Brain: No evidence of acute infarction, hemorrhage, hydrocephalus, extra-axial collection or mass lesion/mass effect. Chronic small vessel ischemia in the cerebral white matter. Cerebral volume loss. Vascular:  No hyperdense vessel or unexpected calcification. Skull: Negative for fracture Sinuses/Orbits: Bilateral cataract resection.  No evidence of injury CT CERVICAL SPINE FINDINGS Alignment: No traumatic malalignment. C3-4 anterolisthesis that is degenerative. Otherwise there is generalized straightening of the cervical spine. Skull base and vertebrae: No acute fracture Soft tissues and spinal canal: No prevertebral fluid or swelling. No visible canal hematoma. Disc levels: Bulky ventral spondylitic spurring at C4 and below. Multilevel facet spurring with C2-3 and C3-4 ankylosis. Upper chest: No acute finding IMPRESSION: 1. No evidence of acute intracranial or cervical spine injury. 2. Aging brain and extensive cervical spondylosis with facet arthropathy. Electronically Signed   By: Monte Fantasia M.D.   On: 07/03/2020 04:57   CT CERVICAL SPINE WO CONTRAST  Result Date: 07/03/2020 CLINICAL DATA:  Fall with head neck injury.  Blood thinner EXAM: CT HEAD WITHOUT CONTRAST CT CERVICAL SPINE WITHOUT CONTRAST TECHNIQUE: Multidetector CT imaging of the head and cervical spine was performed following the standard protocol without intravenous contrast. Multiplanar CT image reconstructions of the cervical spine were also generated. COMPARISON:  06/02/2020 FINDINGS: CT HEAD FINDINGS Brain: No evidence of acute infarction, hemorrhage, hydrocephalus, extra-axial collection or mass lesion/mass effect. Chronic small vessel ischemia in the cerebral white matter. Cerebral volume loss. Vascular: No hyperdense vessel or unexpected calcification. Skull: Negative for fracture Sinuses/Orbits: Bilateral cataract resection.  No evidence of injury CT CERVICAL SPINE FINDINGS Alignment: No traumatic malalignment. C3-4 anterolisthesis that is degenerative. Otherwise there is generalized straightening of the cervical spine. Skull base and vertebrae: No acute fracture Soft tissues and spinal canal: No prevertebral fluid or swelling. No visible  canal hematoma. Disc levels: Bulky ventral spondylitic spurring at C4 and below. Multilevel facet spurring with C2-3 and C3-4 ankylosis. Upper chest: No acute finding IMPRESSION: 1. No evidence of acute intracranial or cervical spine injury. 2. Aging brain and extensive cervical spondylosis with facet arthropathy. Electronically Signed   By: Monte Fantasia M.D.   On: 07/03/2020 04:57   DG Pelvis Portable  Result Date: 07/03/2020 CLINICAL DATA:  Fall. EXAM: PORTABLE PELVIS 1-2 VIEWS COMPARISON:  AP pelvis 06/02/2020.  Lumbar spine 07/02/2019. FINDINGS: Degenerative changes lumbar spine and both hips. No acute bony or joint abnormality identified. No evidence of fracture or dislocation. Aortoiliac and peripheral vascular calcification. Pelvic calcifications consistent phleboliths. IMPRESSION: 1. Degenerative changes lumbar spine and both hips. No acute abnormality. 2.  Aortoiliac and peripheral vascular disease. Electronically Signed   By: Marcello Moores  Register   On: 07/03/2020 05:14   DG Chest Port 1 View  Result Date: 07/03/2020 CLINICAL DATA:  Fall. EXAM: PORTABLE CHEST 1 VIEW COMPARISON:  06/04/2020. FINDINGS: Prior CABG. Cardiomegaly. No pulmonary venous congestion. Low lung volumes with mild bibasilar subsegmental atelectasis. No focal infiltrate. No pleural effusion or pneumothorax. Degenerative changes scoliosis thoracic spine. No acute bony abnormality identified. Surgical clips right upper quadrant. IMPRESSION: 1.  Prior CABG.  Cardiomegaly. 2. Low lung volumes with mild bibasilar subsegmental atelectasis. No acute abnormality identified. Electronically Signed   By: Marcello Moores  Register   On: 07/03/2020 05:12   DG Knee Right Port  Result Date: 07/03/2020 CLINICAL DATA:  Fall. EXAM: PORTABLE RIGHT KNEE - 1-2 VIEW COMPARISON:  No recent prior. FINDINGS: Tricompartment degenerative change. No acute bony or joint abnormality. No evidence of fracture or dislocation. Dystrophic calcification noted over the  quadriceps tendon. Peripheral vascular calcification. No prominent effusion. Surgical clips noted over the upper calf. IMPRESSION: 1.  Tricompartment degenerative change.  No acute bony abnormality. 2.  Peripheral vascular disease. Electronically Signed   By: Marcello Moores  Register   On: 07/03/2020 05:17    Procedures Procedures (including critical care time)  Medications Ordered in ED Medications - No data to display  ED Course  I have reviewed the triage vital signs and the nursing notes.  Pertinent labs & imaging results that were available during my care of the patient were reviewed by me and considered in my medical decision making (see chart for details).    MDM Rules/Calculators/A&P                          Patient presents to the emergency department after an unwitnessed fall. Patient has a history of dementia.  At arrival he denies any complaints.  He did complain of knee pain for EMS.  He has an abrasion and some mild swelling of the anterior portion of the knee but no instability.  X-ray negative.  Hips are mobile bilaterally without pain or deformity.  Patient does take Eliquis, therefore CT head was performed.  Because of his dementia, cannot clear his neck clinically, CT cervical spine performed as well.  CTs are negative for acute abnormality.  Patient appropriate for return to nursing home.   Final Clinical Impression(s) / ED Diagnoses Final diagnoses:  Fall  Contusion of scalp, initial encounter  Contusion of right knee, initial encounter    Rx / DC Orders ED Discharge Orders    None       Haneef Hallquist, Gwenyth Allegra, MD 07/03/20 [phone]

## 2020-07-05 DIAGNOSIS — I6529 Occlusion and stenosis of unspecified carotid artery: Secondary | ICD-10-CM | POA: Diagnosis not present

## 2020-07-05 DIAGNOSIS — F419 Anxiety disorder, unspecified: Secondary | ICD-10-CM | POA: Diagnosis not present

## 2020-07-05 DIAGNOSIS — I509 Heart failure, unspecified: Secondary | ICD-10-CM | POA: Diagnosis not present

## 2020-07-05 DIAGNOSIS — I480 Paroxysmal atrial fibrillation: Secondary | ICD-10-CM | POA: Diagnosis not present

## 2020-07-05 DIAGNOSIS — Z7901 Long term (current) use of anticoagulants: Secondary | ICD-10-CM | POA: Diagnosis not present

## 2020-07-05 DIAGNOSIS — I739 Peripheral vascular disease, unspecified: Secondary | ICD-10-CM | POA: Diagnosis not present

## 2020-07-05 DIAGNOSIS — K219 Gastro-esophageal reflux disease without esophagitis: Secondary | ICD-10-CM | POA: Diagnosis not present

## 2020-07-05 DIAGNOSIS — F329 Major depressive disorder, single episode, unspecified: Secondary | ICD-10-CM | POA: Diagnosis not present

## 2020-07-06 DIAGNOSIS — F329 Major depressive disorder, single episode, unspecified: Secondary | ICD-10-CM | POA: Diagnosis not present

## 2020-07-06 DIAGNOSIS — M1990 Unspecified osteoarthritis, unspecified site: Secondary | ICD-10-CM | POA: Diagnosis not present

## 2020-07-06 DIAGNOSIS — E1122 Type 2 diabetes mellitus with diabetic chronic kidney disease: Secondary | ICD-10-CM | POA: Diagnosis not present

## 2020-07-06 DIAGNOSIS — F419 Anxiety disorder, unspecified: Secondary | ICD-10-CM | POA: Diagnosis not present

## 2020-07-06 DIAGNOSIS — I739 Peripheral vascular disease, unspecified: Secondary | ICD-10-CM | POA: Diagnosis not present

## 2020-07-06 DIAGNOSIS — D649 Anemia, unspecified: Secondary | ICD-10-CM | POA: Diagnosis not present

## 2020-07-06 DIAGNOSIS — I509 Heart failure, unspecified: Secondary | ICD-10-CM | POA: Diagnosis not present

## 2020-07-12 DIAGNOSIS — F419 Anxiety disorder, unspecified: Secondary | ICD-10-CM | POA: Diagnosis not present

## 2020-07-12 DIAGNOSIS — K219 Gastro-esophageal reflux disease without esophagitis: Secondary | ICD-10-CM | POA: Diagnosis not present

## 2020-07-12 DIAGNOSIS — F329 Major depressive disorder, single episode, unspecified: Secondary | ICD-10-CM | POA: Diagnosis not present

## 2020-07-12 DIAGNOSIS — I509 Heart failure, unspecified: Secondary | ICD-10-CM | POA: Diagnosis not present

## 2020-07-12 DIAGNOSIS — E782 Mixed hyperlipidemia: Secondary | ICD-10-CM | POA: Diagnosis not present

## 2020-07-12 DIAGNOSIS — I739 Peripheral vascular disease, unspecified: Secondary | ICD-10-CM | POA: Diagnosis not present

## 2020-07-12 DIAGNOSIS — I480 Paroxysmal atrial fibrillation: Secondary | ICD-10-CM | POA: Diagnosis not present

## 2020-07-12 DIAGNOSIS — Z7901 Long term (current) use of anticoagulants: Secondary | ICD-10-CM | POA: Diagnosis not present

## 2020-07-14 DIAGNOSIS — L89620 Pressure ulcer of left heel, unstageable: Secondary | ICD-10-CM | POA: Diagnosis not present

## 2020-07-14 DIAGNOSIS — I4891 Unspecified atrial fibrillation: Secondary | ICD-10-CM | POA: Diagnosis not present

## 2020-07-14 DIAGNOSIS — L8995 Pressure ulcer of unspecified site, unstageable: Secondary | ICD-10-CM | POA: Diagnosis not present

## 2020-07-14 DIAGNOSIS — M6281 Muscle weakness (generalized): Secondary | ICD-10-CM | POA: Diagnosis not present

## 2020-07-14 DIAGNOSIS — E1122 Type 2 diabetes mellitus with diabetic chronic kidney disease: Secondary | ICD-10-CM | POA: Diagnosis not present

## 2020-07-14 DIAGNOSIS — I10 Essential (primary) hypertension: Secondary | ICD-10-CM | POA: Diagnosis not present

## 2020-07-14 DIAGNOSIS — I739 Peripheral vascular disease, unspecified: Secondary | ICD-10-CM | POA: Diagnosis not present

## 2020-07-14 DIAGNOSIS — F329 Major depressive disorder, single episode, unspecified: Secondary | ICD-10-CM | POA: Diagnosis not present

## 2020-07-14 DIAGNOSIS — R69 Illness, unspecified: Secondary | ICD-10-CM | POA: Diagnosis not present

## 2020-07-14 DIAGNOSIS — J9602 Acute respiratory failure with hypercapnia: Secondary | ICD-10-CM | POA: Diagnosis not present

## 2020-07-14 DIAGNOSIS — F419 Anxiety disorder, unspecified: Secondary | ICD-10-CM | POA: Diagnosis not present

## 2020-07-14 DIAGNOSIS — N184 Chronic kidney disease, stage 4 (severe): Secondary | ICD-10-CM | POA: Diagnosis not present

## 2020-07-14 DIAGNOSIS — D649 Anemia, unspecified: Secondary | ICD-10-CM | POA: Diagnosis not present

## 2020-07-14 DIAGNOSIS — I509 Heart failure, unspecified: Secondary | ICD-10-CM | POA: Diagnosis not present

## 2020-07-14 DIAGNOSIS — E039 Hypothyroidism, unspecified: Secondary | ICD-10-CM | POA: Diagnosis not present

## 2020-07-14 DIAGNOSIS — R2689 Other abnormalities of gait and mobility: Secondary | ICD-10-CM | POA: Diagnosis not present

## 2020-07-14 DIAGNOSIS — R41841 Cognitive communication deficit: Secondary | ICD-10-CM | POA: Diagnosis not present

## 2020-07-14 DIAGNOSIS — R2681 Unsteadiness on feet: Secondary | ICD-10-CM | POA: Diagnosis not present

## 2020-07-14 DIAGNOSIS — E782 Mixed hyperlipidemia: Secondary | ICD-10-CM | POA: Diagnosis not present

## 2020-07-14 DIAGNOSIS — J9601 Acute respiratory failure with hypoxia: Secondary | ICD-10-CM | POA: Diagnosis not present

## 2020-07-16 ENCOUNTER — Other Ambulatory Visit (HOSPITAL_COMMUNITY): Payer: Self-pay

## 2020-07-16 DIAGNOSIS — J449 Chronic obstructive pulmonary disease, unspecified: Secondary | ICD-10-CM | POA: Diagnosis not present

## 2020-07-16 DIAGNOSIS — J9601 Acute respiratory failure with hypoxia: Secondary | ICD-10-CM | POA: Diagnosis not present

## 2020-07-16 DIAGNOSIS — I5022 Chronic systolic (congestive) heart failure: Secondary | ICD-10-CM | POA: Diagnosis not present

## 2020-07-16 DIAGNOSIS — J9602 Acute respiratory failure with hypercapnia: Secondary | ICD-10-CM | POA: Diagnosis not present

## 2020-07-16 NOTE — Progress Notes (Signed)
I went to Mr Bellerose is scheduled to come home from the rehabilitation facility tomorrow. Due to this development I went by his home today to ensure he has all the necessary medications and refill his pillbox. I noted the medication list from the facility has him on prednisone and pantoprazole however he did not have either of these medications at home. I made Mrs Lemelle aware and she stated she would check with the nurse tomorrow before they leave. I will follow up next week.   [name], EMT 07/16/20

## 2020-07-20 ENCOUNTER — Telehealth: Payer: Self-pay | Admitting: Internal Medicine

## 2020-07-20 NOTE — Telephone Encounter (Signed)
Verbal order provided.

## 2020-07-20 NOTE — Telephone Encounter (Signed)
Carley from [facility] home health called and requested verbal orders, [phone]

## 2020-07-21 ENCOUNTER — Other Ambulatory Visit: Payer: Self-pay

## 2020-07-21 DIAGNOSIS — I739 Peripheral vascular disease, unspecified: Secondary | ICD-10-CM

## 2020-07-22 ENCOUNTER — Other Ambulatory Visit: Payer: Self-pay

## 2020-07-22 ENCOUNTER — Ambulatory Visit (INDEPENDENT_AMBULATORY_CARE_PROVIDER_SITE_OTHER): Payer: Medicare PPO | Admitting: Podiatrist

## 2020-07-22 ENCOUNTER — Ambulatory Visit: Payer: Medicare PPO | Admitting: Podiatrist

## 2020-07-22 ENCOUNTER — Encounter: Payer: Self-pay | Admitting: Podiatrist

## 2020-07-22 DIAGNOSIS — E1159 Type 2 diabetes mellitus with other circulatory complications: Secondary | ICD-10-CM | POA: Diagnosis not present

## 2020-07-22 DIAGNOSIS — B351 Tinea unguium: Secondary | ICD-10-CM | POA: Diagnosis not present

## 2020-07-22 DIAGNOSIS — L89623 Pressure ulcer of left heel, stage 3: Secondary | ICD-10-CM

## 2020-07-22 DIAGNOSIS — M79676 Pain in unspecified toe(s): Secondary | ICD-10-CM | POA: Diagnosis not present

## 2020-07-22 MED ORDER — DOXYCYCLINE HYCLATE 100 MG PO TABS
100.0000 mg | ORAL_TABLET | Freq: Two times a day (BID) | ORAL | 0 refills | Status: DC
Start: 2020-07-22 — End: 2020-07-22

## 2020-07-22 MED ORDER — AMOXICILLIN-POT CLAVULANATE 500-125 MG PO TABS: 1.0000 | ORAL_TABLET | Freq: Two times a day (BID) | ORAL | 0 refills | Status: DC

## 2020-07-22 MED ORDER — AMOXICILLIN-POT CLAVULANATE 875-125 MG PO TABS: 1.0000 | ORAL_TABLET | Freq: Two times a day (BID) | ORAL | 0 refills | Status: DC

## 2020-07-22 MED ORDER — HYDROCODONE-ACETAMINOPHEN 5-325 MG PO TABS: 1.0000 | ORAL_TABLET | ORAL | 0 refills | Status: AC | PRN

## 2020-07-22 NOTE — Progress Notes (Signed)
  Chief Complaint  Patient presents with  . Nail Problem    pt is here for a nail trim. Pt also states that he has a sore on the bottom of the left heel. Pt states that it is often painful to walk on as well.     HPI: Patient is 86 y.o. male who presents today for the concerns as listed above.  The patients wife is with him and she states he was in the hospital recently and then in [place] where a heel wound was seen- she states they took xrays at [place] and said the wound was not infected.  No home treatment was recommended.  The patient states the heel is very painful as he is not neuropathic.  He is also here for a nail trim.    Review of Systems No fevers, chills, nausea, muscle aches, no difficulty breathing, no calf pain, no chest pain or shortness of breath.   Physical Exam  GENERAL APPEARANCE: Alert, conversant. Appropriately groomed. No acute distress.   VASCULAR: Pedal pulses palpable at 1/4  DP and PT bilateral.  Capillary refill time is immediate to all digits,  Proximal to distal cooling it warm to warm.  Digital perfusion adequate.   NEUROLOGIC: sensation is intact epicritically and protectively to 5.07 monofilament at 5/5 sites bilateral.  Light touch is intact bilateral, vibratory sensation intact bilateral, achilles tendon reflex is intact bilateral.   MUSCULOSKELETAL: acceptable muscle strength, tone and stability bilateral.    DERMATOLOGIC: digital nails are yellow, thick, brittle and clinically mycotic x 10.  General swelling of the legs and feet are noted with redness on the anterior shins and multiple areas of small weeping of clear fluid noted.  On the left heel is a large decubitus ulcer with a central eschar/slough.  The uler measures 5cm in diameter and is flush with the surrounding skin.  Red granular tissue surrounds the fibrotic central portion of the wound- peri wound maceration is also noted. Slight malodor is present        Assessment    ICD-10-CM    1. Decubitus ulcer of left heel, stage 3 ([facility])  L89.623   2. Pain due to onychomycosis of toenail  B35.1    M79.676   3. Type 2 diabetes mellitus with vascular disease ([place])  E11.59       Plan  Cleansed the heel ulcer with wound wash and dried well.  Applied iodosorb and a fluffy dry sterile dressing and light ace wrap.  He has home health coming tomorrow and I will fax orders for dressing changes with santyl. Rx for Augmentin 500/125 bid written as well as for hydrocodone for pain.  He will see Dr. March Rummage in 1-2 weeks for follow up.    Onychoreduction of symptomatic toenails was performed via nail nipper and power burr without iatrogenic incident.  Patient was instructed on signs and symptoms of infection and was told to call immediately should any of these arise.

## 2020-07-23 ENCOUNTER — Telehealth: Payer: Self-pay | Admitting: *Deleted

## 2020-07-23 DIAGNOSIS — I5022 Chronic systolic (congestive) heart failure: Secondary | ICD-10-CM | POA: Diagnosis not present

## 2020-07-23 DIAGNOSIS — E1151 Type 2 diabetes mellitus with diabetic peripheral angiopathy without gangrene: Secondary | ICD-10-CM | POA: Diagnosis not present

## 2020-07-23 DIAGNOSIS — J9611 Chronic respiratory failure with hypoxia: Secondary | ICD-10-CM | POA: Diagnosis not present

## 2020-07-23 DIAGNOSIS — J441 Chronic obstructive pulmonary disease with (acute) exacerbation: Secondary | ICD-10-CM | POA: Diagnosis not present

## 2020-07-23 DIAGNOSIS — E1122 Type 2 diabetes mellitus with diabetic chronic kidney disease: Secondary | ICD-10-CM | POA: Diagnosis not present

## 2020-07-23 DIAGNOSIS — L89620 Pressure ulcer of left heel, unstageable: Secondary | ICD-10-CM | POA: Diagnosis not present

## 2020-07-23 DIAGNOSIS — D631 Anemia in chronic kidney disease: Secondary | ICD-10-CM | POA: Diagnosis not present

## 2020-07-23 DIAGNOSIS — I130 Hypertensive heart and chronic kidney disease with heart failure and stage 1 through stage 4 chronic kidney disease, or unspecified chronic kidney disease: Secondary | ICD-10-CM | POA: Diagnosis not present

## 2020-07-23 DIAGNOSIS — N184 Chronic kidney disease, stage 4 (severe): Secondary | ICD-10-CM | POA: Diagnosis not present

## 2020-07-23 NOTE — Telephone Encounter (Signed)
I spoke with pt's wife, Vinnie Level and she states [facility] will send nurse, Alyse Low to their house today between 3:30-4:30pm for start of care. I told Vinnie Level I would send our referral for with notification that pt is to be have their start of care today and we would like to add wound care orders. Manuela Schwartz states she got the antibiotic, but was skeptical concerning the addictive pain medication and would discuss with pt's EMT. Faxed required form with orders, SNapShot and demographics to [facility]. Faxed required form, clinicals and demographics to [employer].

## 2020-07-23 NOTE — Telephone Encounter (Signed)
-----   Message from Bronson Ing, DPM sent at 07/22/2020  5:16 PM EDT ----- Hi Joice Nazario-This patient has home health already ordered for something else so I need to add wound care orders for his left heel.  I would love to order Santyl and gauze and gauze wrap -- with orders to apply Santyl to the wound every other day and cover with gauze dressing and wrap.  He also needs to offload the foot with heel lift boots (I don't know if home health has those?  If not, we have them here for $25 per Dr. Adah Perl).  The patients wife says the home health agency has "medi" in the name-  Maybe [facility] (?- she is supposed to call here and let me know the name)Let me know what I need to do if I forgot something!  Thank you!!  Dr. Johnette Abraham

## 2020-07-24 ENCOUNTER — Encounter (HOSPITAL_COMMUNITY): Payer: Medicare PPO

## 2020-07-24 ENCOUNTER — Telehealth: Payer: Self-pay | Admitting: *Deleted

## 2020-07-24 ENCOUNTER — Encounter: Payer: Self-pay | Admitting: Vascular Surgery

## 2020-07-24 ENCOUNTER — Other Ambulatory Visit (HOSPITAL_COMMUNITY): Payer: Self-pay

## 2020-07-24 ENCOUNTER — Other Ambulatory Visit: Payer: Self-pay

## 2020-07-24 ENCOUNTER — Ambulatory Visit (INDEPENDENT_AMBULATORY_CARE_PROVIDER_SITE_OTHER): Payer: Medicare PPO | Admitting: Vascular Surgery

## 2020-07-24 VITALS — BP 134/66 | HR 66 | Temp 97.5°F | Resp 16 | Ht 67.0 in | Wt 185.0 lb

## 2020-07-24 DIAGNOSIS — I739 Peripheral vascular disease, unspecified: Secondary | ICD-10-CM | POA: Diagnosis not present

## 2020-07-24 NOTE — Progress Notes (Signed)
Patient ID: Andrew [name], male   DOB: Mar 07, 1928, 86 y.o.   MRN: [id number]  Reason for Consult: PVD (PVD)   Referred by Wenda Low, MD  Subjective:     HPI:  Andrew [name] is a 86 y.o. male history of open aortic aneurysm repair with renal artery bypass.  Also has history of coronary artery disease with CABG with harvest of right greater saphenous vein.  Was recently hospitalized after a fall was in rehab up until 1 week ago.  He was found to have a left heel ulceration has been evaluated by podiatry.  He states that this is painful apparently had x-rays performed in rehab but was was not thought to be infected.  He now presents for further evaluation.  States that he does have bilateral lower extremity swelling no changes to his heel may be is healing.   Patient is quite confused on exam.  Past Medical History:  Diagnosis Date  . AAA (abdominal aortic aneurysm) ([place])   . Anemia   . Arthritis   . CAD (coronary artery disease)   . Cancer ([place])    Melanoma - Back  . Carotid artery stenosis   . CHF (congestive heart failure) ([place])   . COPD (chronic obstructive pulmonary disease) ([place])   . Cyst of kidney, acquired   . Depression   . Diabetes mellitus   . DVT (deep venous thrombosis) ([place])   . Dysrhythmia   . Fatty liver 2008  . GERD (gastroesophageal reflux disease)   . Hyperlipidemia   . Hypertension   . IBS (irritable bowel syndrome)   . LVF (left ventricular failure) ([place])   . Paroxysmal atrial fibrillation ([facility])   . Pneumonia Jan. 2014  . Shortness of breath dyspnea   . Thyroid disease   . Typical atrial flutter ([place])   . UTI (urinary tract infection) 05/2015   While in [place]   . Vitamin D deficiency    Family History  Problem Relation Age of Onset  . Colon cancer Father   . Coronary artery disease Brother   . Diabetes Brother   . Heart disease Brother   . Hyperlipidemia Brother   . Peripheral vascular disease Brother        Varicose Veins  .  Throat cancer Brother        abdominal cancer?   . Diabetes Son   . Hyperlipidemia Son    Past Surgical History:  Procedure Laterality Date  . ABDOMINAL AORTIC ANEURYSM REPAIR  2002  . BIOPSY  06/10/2020   Procedure: BIOPSY;  Surgeon: Ronnette Juniper, MD;  Location: [facility];  Service: Gastroenterology;;  . CARDIAC CATHETERIZATION    . CORONARY ANGIOPLASTY    . CORONARY ANGIOPLASTY WITH STENT PLACEMENT  2005  . CORONARY ARTERY BYPASS GRAFT  1992  . ESOPHAGEAL BRUSHING  06/10/2020   Procedure: ESOPHAGEAL BRUSHING;  Surgeon: Ronnette Juniper, MD;  Location: [facility];  Service: Gastroenterology;;  . ESOPHAGOGASTRODUODENOSCOPY N/A 06/10/2020   Procedure: ESOPHAGOGASTRODUODENOSCOPY (EGD);  Surgeon: Ronnette Juniper, MD;  Location: [facility];  Service: Gastroenterology;  Laterality: N/A;  . EYE SURGERY     [place]  . HERNIA REPAIR    . INGUINAL HERNIA REPAIR Bilateral    w/mesh  . INSERTION OF MESH N/A 11/28/2017   Procedure: INSERTION OF MESH;  Surgeon: Donnie Mesa, MD;  Location: [facility];  Service: General;  Laterality: N/A;  GENERAL AND TAP BLOCK  . KNEE SURGERY Bilateral   . LAPAROSCOPIC INGUINAL HERNIA WITH UMBILICAL  HERNIA Bilateral 11/28/2017   Procedure: LAPAROSCOPIC BILATERAL INGUINAL HERNIA REPAIR WITH MESH, UMBILICAL HERNIA REPAIR WITH MESH;  Surgeon: Donnie Mesa, MD;  Location: [facility];  Service: General;  Laterality: Bilateral;  GENERAL AND TAP BLOCK  . LEFT HEART CATHETERIZATION WITH CORONARY ANGIOGRAM N/A 10/30/2014   Procedure: LEFT HEART CATHETERIZATION WITH CORONARY ANGIOGRAM;  Surgeon: Larey Dresser, MD;  [facility];  Service: Cardiovascular;  Laterality: N/A;  . QUADRICEPS TENDON REPAIR Bilateral 08/2003   Archie Endo 05/10/2011  . UMBILICAL HERNIA REPAIR  11/28/2017   w/mesh    Short Social History:  Social History   Tobacco Use  . Smoking status: Never Smoker  . Smokeless tobacco: Never Used  . Tobacco comment: quit atleast 25 yrs ago, per pt  Substance Use  Topics  . Alcohol use: Never    Comment: socially    Allergies  Allergen Reactions  . Metoprolol Shortness Of Breath, Palpitations and Other (See Comments)    Heart starts racing. Shallow breathing; pt states he also get skin irritation on his legs  . Metformin Hives and Swelling    On legs  . Metformin And Related Nausea And Vomiting    Current Outpatient Medications  Medication Sig Dispense Refill  . acetaminophen (TYLENOL) 500 MG tablet Take 2 tablets (1,000 mg total) by mouth every 8 (eight) hours as needed. 30 tablet 0  . acetaminophen (TYLENOL) 500 MG tablet Take 1,000 mg by mouth every 6 (six) hours as needed for moderate pain or headache.    Marland Kitchen acetaminophen (TYLENOL) 500 MG tablet Take 1,000 mg by mouth every 6 (six) hours as needed for mild pain.    Marland Kitchen amiodarone (PACERONE) 200 MG tablet Take 0.5 tablets (100 mg total) by mouth daily. 45 tablet 3  . amiodarone (PACERONE) 200 MG tablet Take 100 mg by mouth daily.    Marland Kitchen amiodarone (PACERONE) 200 MG tablet Take 100 mg by mouth daily.    Marland Kitchen amoxicillin-clavulanate (AUGMENTIN) 500-125 MG tablet Take 1 tablet (500 mg total) by mouth in the morning and at bedtime. 20 tablet 0  . apixaban (ELIQUIS) 2.5 MG TABS tablet Take 1 tablet (2.5 mg total) by mouth 2 (two) times daily. (Patient not taking: Reported on 07/22/2020) 180 tablet 3  . apixaban (ELIQUIS) 2.5 MG TABS tablet Take 2.5 mg by mouth 2 (two) times daily.    Marland Kitchen atorvastatin (LIPITOR) 80 MG tablet TAKE 1 TABLET EACH DAY. (Patient taking differently: Take 80 mg by mouth daily. ) 30 tablet 3  . atorvastatin (LIPITOR) 80 MG tablet Take 80 mg by mouth daily.    Marland Kitchen atorvastatin (LIPITOR) 80 MG tablet Take 80 mg by mouth daily.    . baclofen (LIORESAL) 10 MG tablet Take 0.5-1 tablets (5-10 mg total) by mouth 3 (three) times daily. 30 each 0  . baclofen (LIORESAL) 10 MG tablet Take 10 mg by mouth daily as needed for muscle spasms.     . baclofen (LIORESAL) 10 MG tablet Take 10 mg by mouth  daily as needed for muscle spasms.     . carvedilol (COREG) 12.5 MG tablet Take 1 tablet (12.5 mg total) by mouth 2 (two) times daily. 180 tablet 3  . carvedilol (COREG) 12.5 MG tablet Take 12.5 mg by mouth 2 (two) times daily.    . carvedilol (COREG) 12.5 MG tablet Take 12.5 mg by mouth 2 (two) times daily with a meal.    . cephALEXin (KEFLEX) 500 MG capsule Take 1 capsule (500 mg total) by  mouth 4 (four) times daily. 28 capsule 0  . cilostazol (PLETAL) 100 MG tablet Take 1 tablet (100 mg total) by mouth 2 (two) times daily. 180 tablet 3  . cilostazol (PLETAL) 100 MG tablet Take 100 mg by mouth 2 (two) times daily.    Marland Kitchen ELIQUIS 2.5 MG TABS tablet Take 2.5 mg by mouth 2 (two) times daily.    Marland Kitchen ezetimibe (ZETIA) 10 MG tablet TAKE (1) TABLET DAILY. 90 tablet 0  . ezetimibe (ZETIA) 10 MG tablet Take 10 mg by mouth daily.    Marland Kitchen ezetimibe (ZETIA) 10 MG tablet Take 10 mg by mouth daily.    . fluconazole (DIFLUCAN) 100 MG tablet Take 1 tablet (100 mg total) by mouth daily. 5 tablet 0  . FLUoxetine (PROZAC) 20 MG capsule TAKE (1) CAPSULE DAILY. 90 capsule 0  . FLUoxetine (PROZAC) 20 MG capsule Take 20 mg by mouth daily.    Marland Kitchen FLUoxetine (PROZAC) 20 MG capsule Take 20 mg by mouth daily.    . furosemide (LASIX) 40 MG tablet Take 1 tablet (40 mg total) by mouth 2 (two) times daily. 30 tablet   . furosemide (LASIX) 80 MG tablet TAKE 1&1/2 TABLETS IN THE MORNING AND 1 TABLET IN THE AFTERNOON. 225 tablet 6  . furosemide (LASIX) 80 MG tablet Take 80-120 mg by mouth See admin instructions. Take 1 and 1/2 tablets every morning then take 1 tablet in the afternoon    . gabapentin (NEURONTIN) 100 MG capsule Take 100 mg by mouth 3 (three) times daily.    Marland Kitchen gabapentin (NEURONTIN) 100 MG capsule Take 100 mg by mouth 3 (three) times daily.    Marland Kitchen glucose blood (ACCU-CHEK AVIVA PLUS) test strip CHECK BLOOD SUGAR 4 TIMES A DAY. 100 each 11  . hydrALAZINE (APRESOLINE) 50 MG tablet Take 1 tablet (50 mg total) by mouth 3  (three) times daily. 270 tablet 3  . hydrALAZINE (APRESOLINE) 50 MG tablet Take 50 mg by mouth 3 (three) times daily.    . hydrALAZINE (APRESOLINE) 50 MG tablet Take 50 mg by mouth 3 (three) times daily.    Marland Kitchen HYDROcodone-acetaminophen (NORCO/VICODIN) 5-325 MG tablet Take 1 tablet by mouth every 4 (four) hours as needed for up to 5 days. 30 tablet 0  . Infant Care Products [facility] EX) Apply topically in the morning and at bedtime. (Ointment) To buttock/scrotum    . Influenza Vac A&B Surf Ant Adj (FLUAD) 0.5 ML SUSY     . isosorbide mononitrate (IMDUR) 30 MG 24 hr tablet TAKE 1 TABLET EACH DAY. 90 tablet 3  . isosorbide mononitrate (IMDUR) 30 MG 24 hr tablet Take 30 mg by mouth daily.    . isosorbide mononitrate (IMDUR) 30 MG 24 hr tablet Take 30 mg by mouth daily.    . Lancets MISC Check blood sugar 4 times a day 100 each 11  . levothyroxine (SYNTHROID) 100 MCG tablet TAKE 1 TABLET BEFORE BREAKFAST. 90 tablet 0  . levothyroxine (SYNTHROID) 100 MCG tablet Take 100 mcg by mouth daily.    Marland Kitchen levothyroxine (SYNTHROID) 100 MCG tablet Take 100 mcg by mouth daily.    . Multiple Vitamin (MULTIVITAMIN WITH MINERALS) TABS tablet Take 1 tablet by mouth daily.    . Multiple Vitamin (MULTIVITAMIN) tablet Take 1 tablet by mouth daily.    . Multiple Vitamins-Minerals (MULTIVITAMIN ADULTS 50+) TABS Take 1 tablet by mouth daily.    . nitroGLYCERIN (NITROSTAT) 0.4 MG SL tablet DISSOLVE 1 TABLET UNDER TONGUE AS NEEDED FOR CHEST  PAIN,MAY REPEAT IN5 MINUTES FOR 2 DOSES. 25 tablet 3  . pantoprazole (PROTONIX) 40 MG tablet Take 1 tablet (40 mg total) by mouth 2 (two) times daily.    . pantoprazole (PROTONIX) 40 MG tablet Take 40 mg by mouth 2 (two) times daily.    . potassium chloride SA (KLOR-CON) 20 MEQ tablet TAKE 1 TABLET ONCE DAILY. 90 tablet 3  . potassium chloride SA (KLOR-CON) 20 MEQ tablet Take 20 mEq by mouth daily.    . potassium chloride SA (KLOR-CON) 20 MEQ tablet Take 20 mEq by mouth daily.    .  predniSONE (DELTASONE) 1 MG tablet Take 5 mg by mouth daily with breakfast.    . predniSONE (DELTASONE) 10 MG tablet Takes  3 tablets for 1 days, then 2 tabs for 1 days, then 1 tab for 1 days, and then stop. 6 tablet 0  . SPIRIVA HANDIHALER 18 MCG inhalation capsule PLACE 1 CAPSULE INTO INHALER AND INHALE ONCE DAILY. 90 capsule 0  . temazepam (RESTORIL) 30 MG capsule Take 30 mg by mouth at bedtime as needed for sleep.    . temazepam (RESTORIL) 30 MG capsule Take 30 mg by mouth at bedtime as needed for sleep.    Marland Kitchen tiotropium (SPIRIVA) 18 MCG inhalation capsule Place 18 mcg into inhaler and inhale daily as needed (sob/wheezing).    Marland Kitchen tiotropium (SPIRIVA) 18 MCG inhalation capsule Place 18 mcg into inhaler and inhale daily as needed (for wheezing).    . traMADol (ULTRAM) 50 MG tablet Take by mouth every 6 (six) hours as needed.     No current facility-administered medications for this visit.    Review of Systems  Constitutional:  Constitutional negative. HENT: HENT negative.  Eyes: Eyes negative.  Respiratory: Respiratory negative.  Cardiovascular: Positive for leg swelling.  GI: Gastrointestinal negative.  Skin: Positive for wound.  Neurological: Neurological negative. Hematologic: Hematologic/lymphatic negative.  Psychiatric: Psychiatric negative.        Objective:  Objective   Vitals:   07/24/20 1438  BP: (!) 134/66  Pulse: 66  Resp: 16  Temp: (!) 97.5 F (36.4 C)  TempSrc: Temporal  SpO2: 92%  Weight: 185 lb (83.9 kg)  Height: 5\' 7"  (1.702 m)   Body mass index is 28.98 kg/m.  Physical Exam HENT:     Head: Normocephalic.     Nose:     Comments: Mask in place Eyes:     Pupils: Pupils are equal, round, and reactive to light.  Cardiovascular:     Pulses:          Femoral pulses are 2+ on the right side and 2+ on the left side.      Popliteal pulses are 0 on the right side and 0 on the left side.       Dorsalis pedis pulses are detected w/ Doppler on the right side  and detected w/ Doppler on the left side.  Pulmonary:     Effort: Pulmonary effort is normal.  Abdominal:     General: Abdomen is flat.     Palpations: Abdomen is soft. There is no mass.  Musculoskeletal:     Right lower leg: Edema present.     Left lower leg: Edema present.  Skin:    Comments: There is a 3 cm dry ulcer to the left heel.  Neurological:     General: No focal deficit present.     Mental Status: He is alert.  Psychiatric:        Mood  and Affect: Mood normal.        Behavior: Behavior normal.        Thought Content: Thought content normal.        Judgment: Judgment normal.     Data: ABI Findings:  +---------+------------------+-----+----------+--------+  Right  Rt Pressure (mmHg)IndexWaveform Comment   +---------+------------------+-----+----------+--------+  Brachial 173                      +---------+------------------+-----+----------+--------+  ATA   194        1.12 monophasic      +---------+------------------+-----+----------+--------+  PTA   149        0.86 absent        +---------+------------------+-----+----------+--------+  Great Toe194        1.12            +---------+------------------+-----+----------+--------+   +---------+------------------+-----+----------+-------+  Left   Lt Pressure (mmHg)IndexWaveform Comment  +---------+------------------+-----+----------+-------+  Brachial 160                      +---------+------------------+-----+----------+-------+  ATA   255        1.47 triphasic       +---------+------------------+-----+----------+-------+  PTA   255        1.47 monophasic      +---------+------------------+-----+----------+-------+  Great Toe168        0.97            +---------+------------------+-----+----------+-------+    +-------+-----------+-----------+------------+------------+  ABI/TBIToday's ABIToday's TBIPrevious ABIPrevious TBI  +-------+-----------+-----------+------------+------------+  Right 1.12    1.12    1.13    1        +-------+-----------+-----------+------------+------------+  Left  [place]     0.97    [place]     0.93      +-------+-----------+-----------+------------+------------+       Summary:  Right:   ABI is within normal limits, however this seems to be falsely elevated due  to dampened waveforms. Hx noncompressible vessels.  Left: Resting left ankle-brachial index indicates noncompressible left  lower extremity arteries.            Assessment/Plan:     86 year old male with left heel ulceration also has a small left great toe ulcer.  ABIs are noncompressible.  Wound actually appears to be somewhat improved since evaluation with podiatry.  We will schedule him for aortogram with bilateral lower extremity runoff possible intervention of the left and this will need to be done with CO2 given his elevated creatinine.  Unknown whether his previous aortic repair was done with a 2 graft or aortobiiliac bypass which could complicate any vascular intervention at the time of procedure.  I discussed this with the patient and his wife.  Patient actually seems somewhat confused as to the gravity of the situation or at least in different.  We will get him scheduled in the very near future.     Waynetta Sandy MD [facility]

## 2020-07-24 NOTE — Progress Notes (Signed)
Mr Gernert was seen at home today and reported feeling generally well. He had just returned from the doctors office being evaluated for an ulser on his heel. Since he has just been seen by a physician, I spent the majority of this visit refilling his pillbox and looking into a new prescription. He was placed on prednisone while in the [facility] but I was unclear on the reason. When I asked Mrs Dunaj she advised it was for "respiratory reasons." Given his severe heel wound I was concerned about a prolonged prednisone regimen so I contacted Audry Riles. She advised that this could cause difficulty with his would healing but she thought he should continue through the weekend since it would not be possible to get clarification on it today. I will work with her next week to resolve this question. I will follow up with Mr Staller next week.   [name], EMT 07/24/20

## 2020-07-24 NOTE — H&P (View-Only) (Signed)
Patient ID: Andrew [name], male   DOB: 09/13/1928, 86 y.o.   MRN: [id number]  Reason for Consult: PVD (PVD)   Referred by Wenda Low, MD  Subjective:     HPI:  Andrew [name] is a 86 y.o. male history of open aortic aneurysm repair with renal artery bypass.  Also has history of coronary artery disease with CABG with harvest of right greater saphenous vein.  Was recently hospitalized after a fall was in rehab up until 1 week ago.  He was found to have a left heel ulceration has been evaluated by podiatry.  He states that this is painful apparently had x-rays performed in rehab but was was not thought to be infected.  He now presents for further evaluation.  States that he does have bilateral lower extremity swelling no changes to his heel may be is healing.   Patient is quite confused on exam.  Past Medical History:  Diagnosis Date   AAA (abdominal aortic aneurysm) ([place])    Anemia    Arthritis    CAD (coronary artery disease)    Cancer (HCC)    Melanoma - Back   Carotid artery stenosis    CHF (congestive heart failure) (HCC)    COPD (chronic obstructive pulmonary disease) ([place])    Cyst of kidney, acquired    Depression    Diabetes mellitus    DVT (deep venous thrombosis) ([place])    Dysrhythmia    Fatty liver 2008   GERD (gastroesophageal reflux disease)    Hyperlipidemia    Hypertension    IBS (irritable bowel syndrome)    LVF (left ventricular failure) (HCC)    Paroxysmal atrial fibrillation ([place])    Pneumonia Jan. 2014   Shortness of breath dyspnea    Thyroid disease    Typical atrial flutter (HCC)    UTI (urinary tract infection) 05/2015   While in [place]    Vitamin D deficiency    Family History  Problem Relation Age of Onset   Colon cancer Father    Coronary artery disease Brother    Diabetes Brother    Heart disease Brother    Hyperlipidemia Brother    Peripheral vascular disease Brother        Varicose Veins    Throat cancer Brother        abdominal cancer?    Diabetes Son    Hyperlipidemia Son    Past Surgical History:  Procedure Laterality Date   ABDOMINAL AORTIC ANEURYSM REPAIR  2002   BIOPSY  06/10/2020   Procedure: BIOPSY;  Surgeon: Ronnette Juniper, MD;  Location: [facility] ENDOSCOPY;  Service: Gastroenterology;;   CARDIAC CATHETERIZATION     CORONARY ANGIOPLASTY     CORONARY ANGIOPLASTY WITH STENT PLACEMENT  2005   CORONARY ARTERY BYPASS GRAFT  1992   ESOPHAGEAL BRUSHING  06/10/2020   Procedure: ESOPHAGEAL BRUSHING;  Surgeon: Ronnette Juniper, MD;  Location: [place];  Service: Gastroenterology;;   ESOPHAGOGASTRODUODENOSCOPY N/A 06/10/2020   Procedure: ESOPHAGOGASTRODUODENOSCOPY (EGD);  Surgeon: Ronnette Juniper, MD;  Location: [place];  Service: Gastroenterology;  Laterality: N/A;   EYE SURGERY     [place]   HERNIA REPAIR     INGUINAL HERNIA REPAIR Bilateral    w/mesh   INSERTION OF MESH N/A 11/28/2017   Procedure: INSERTION OF MESH;  Surgeon: Donnie Mesa, MD;  Location: [place];  Service: General;  Laterality: N/A;  GENERAL AND TAP BLOCK   KNEE SURGERY Bilateral    LAPAROSCOPIC INGUINAL HERNIA WITH UMBILICAL  HERNIA Bilateral 11/28/2017   Procedure: LAPAROSCOPIC BILATERAL INGUINAL HERNIA REPAIR WITH MESH, UMBILICAL HERNIA REPAIR WITH MESH;  Surgeon: Donnie Mesa, MD;  Location: [place];  Service: General;  Laterality: Bilateral;  GENERAL AND TAP BLOCK   LEFT HEART CATHETERIZATION WITH CORONARY ANGIOGRAM N/A 10/30/2014   Procedure: LEFT HEART CATHETERIZATION WITH CORONARY ANGIOGRAM;  Surgeon: Larey Dresser, MD;  Location: [facility] CATH LAB;  Service: Cardiovascular;  Laterality: N/A;   QUADRICEPS TENDON REPAIR Bilateral 08/2003   Archie Endo 5/63/8756   UMBILICAL HERNIA REPAIR  11/28/2017   w/mesh    Short Social History:  Social History   Tobacco Use   Smoking status: Never Smoker   Smokeless tobacco: Never Used   Tobacco comment: quit atleast 25 yrs ago, per pt  Substance Use  Topics   Alcohol use: Never    Comment: socially    Allergies  Allergen Reactions   Metoprolol Shortness Of Breath, Palpitations and Other (See Comments)    Heart starts racing. Shallow breathing; pt states he also get skin irritation on his legs   Metformin Hives and Swelling    On legs   Metformin And Related Nausea And Vomiting    Current Outpatient Medications  Medication Sig Dispense Refill   acetaminophen (TYLENOL) 500 MG tablet Take 2 tablets (1,000 mg total) by mouth every 8 (eight) hours as needed. 30 tablet 0   acetaminophen (TYLENOL) 500 MG tablet Take 1,000 mg by mouth every 6 (six) hours as needed for moderate pain or headache.     acetaminophen (TYLENOL) 500 MG tablet Take 1,000 mg by mouth every 6 (six) hours as needed for mild pain.     amiodarone (PACERONE) 200 MG tablet Take 0.5 tablets (100 mg total) by mouth daily. 45 tablet 3   amiodarone (PACERONE) 200 MG tablet Take 100 mg by mouth daily.     amiodarone (PACERONE) 200 MG tablet Take 100 mg by mouth daily.     amoxicillin-clavulanate (AUGMENTIN) 500-125 MG tablet Take 1 tablet (500 mg total) by mouth in the morning and at bedtime. 20 tablet 0   apixaban (ELIQUIS) 2.5 MG TABS tablet Take 1 tablet (2.5 mg total) by mouth 2 (two) times daily. (Patient not taking: Reported on 07/22/2020) 180 tablet 3   apixaban (ELIQUIS) 2.5 MG TABS tablet Take 2.5 mg by mouth 2 (two) times daily.     atorvastatin (LIPITOR) 80 MG tablet TAKE 1 TABLET EACH DAY. (Patient taking differently: Take 80 mg by mouth daily. ) 30 tablet 3   atorvastatin (LIPITOR) 80 MG tablet Take 80 mg by mouth daily.     atorvastatin (LIPITOR) 80 MG tablet Take 80 mg by mouth daily.     baclofen (LIORESAL) 10 MG tablet Take 0.5-1 tablets (5-10 mg total) by mouth 3 (three) times daily. 30 each 0   baclofen (LIORESAL) 10 MG tablet Take 10 mg by mouth daily as needed for muscle spasms.      baclofen (LIORESAL) 10 MG tablet Take 10 mg by mouth  daily as needed for muscle spasms.      carvedilol (COREG) 12.5 MG tablet Take 1 tablet (12.5 mg total) by mouth 2 (two) times daily. 180 tablet 3   carvedilol (COREG) 12.5 MG tablet Take 12.5 mg by mouth 2 (two) times daily.     carvedilol (COREG) 12.5 MG tablet Take 12.5 mg by mouth 2 (two) times daily with a meal.     cephALEXin (KEFLEX) 500 MG capsule Take 1 capsule (500 mg total) by  mouth 4 (four) times daily. 28 capsule 0   cilostazol (PLETAL) 100 MG tablet Take 1 tablet (100 mg total) by mouth 2 (two) times daily. 180 tablet 3   cilostazol (PLETAL) 100 MG tablet Take 100 mg by mouth 2 (two) times daily.     ELIQUIS 2.5 MG TABS tablet Take 2.5 mg by mouth 2 (two) times daily.     ezetimibe (ZETIA) 10 MG tablet TAKE (1) TABLET DAILY. 90 tablet 0   ezetimibe (ZETIA) 10 MG tablet Take 10 mg by mouth daily.     ezetimibe (ZETIA) 10 MG tablet Take 10 mg by mouth daily.     fluconazole (DIFLUCAN) 100 MG tablet Take 1 tablet (100 mg total) by mouth daily. 5 tablet 0   FLUoxetine (PROZAC) 20 MG capsule TAKE (1) CAPSULE DAILY. 90 capsule 0   FLUoxetine (PROZAC) 20 MG capsule Take 20 mg by mouth daily.     FLUoxetine (PROZAC) 20 MG capsule Take 20 mg by mouth daily.     furosemide (LASIX) 40 MG tablet Take 1 tablet (40 mg total) by mouth 2 (two) times daily. 30 tablet    furosemide (LASIX) 80 MG tablet TAKE 1&1/2 TABLETS IN THE MORNING AND 1 TABLET IN THE AFTERNOON. 225 tablet 6   furosemide (LASIX) 80 MG tablet Take 80-120 mg by mouth See admin instructions. Take 1 and 1/2 tablets every morning then take 1 tablet in the afternoon     gabapentin (NEURONTIN) 100 MG capsule Take 100 mg by mouth 3 (three) times daily.     gabapentin (NEURONTIN) 100 MG capsule Take 100 mg by mouth 3 (three) times daily.     glucose blood (ACCU-CHEK AVIVA PLUS) test strip CHECK BLOOD SUGAR 4 TIMES A DAY. 100 each 11   hydrALAZINE (APRESOLINE) 50 MG tablet Take 1 tablet (50 mg total) by mouth 3  (three) times daily. 270 tablet 3   hydrALAZINE (APRESOLINE) 50 MG tablet Take 50 mg by mouth 3 (three) times daily.     hydrALAZINE (APRESOLINE) 50 MG tablet Take 50 mg by mouth 3 (three) times daily.     HYDROcodone-acetaminophen (NORCO/VICODIN) 5-325 MG tablet Take 1 tablet by mouth every 4 (four) hours as needed for up to 5 days. 30 tablet 0   Infant Care Products (DERMACLOUD EX) Apply topically in the morning and at bedtime. (Ointment) To buttock/scrotum     Influenza Vac A&B Surf Ant Adj (FLUAD) 0.5 ML SUSY      isosorbide mononitrate (IMDUR) 30 MG 24 hr tablet TAKE 1 TABLET EACH DAY. 90 tablet 3   isosorbide mononitrate (IMDUR) 30 MG 24 hr tablet Take 30 mg by mouth daily.     isosorbide mononitrate (IMDUR) 30 MG 24 hr tablet Take 30 mg by mouth daily.     Lancets MISC Check blood sugar 4 times a day 100 each 11   levothyroxine (SYNTHROID) 100 MCG tablet TAKE 1 TABLET BEFORE BREAKFAST. 90 tablet 0   levothyroxine (SYNTHROID) 100 MCG tablet Take 100 mcg by mouth daily.     levothyroxine (SYNTHROID) 100 MCG tablet Take 100 mcg by mouth daily.     Multiple Vitamin (MULTIVITAMIN WITH MINERALS) TABS tablet Take 1 tablet by mouth daily.     Multiple Vitamin (MULTIVITAMIN) tablet Take 1 tablet by mouth daily.     Multiple Vitamins-Minerals (MULTIVITAMIN ADULTS 50+) TABS Take 1 tablet by mouth daily.     nitroGLYCERIN (NITROSTAT) 0.4 MG SL tablet DISSOLVE 1 TABLET UNDER TONGUE AS NEEDED FOR CHEST  PAIN,MAY REPEAT IN5 MINUTES FOR 2 DOSES. 25 tablet 3   pantoprazole (PROTONIX) 40 MG tablet Take 1 tablet (40 mg total) by mouth 2 (two) times daily.     pantoprazole (PROTONIX) 40 MG tablet Take 40 mg by mouth 2 (two) times daily.     potassium chloride SA (KLOR-CON) 20 MEQ tablet TAKE 1 TABLET ONCE DAILY. 90 tablet 3   potassium chloride SA (KLOR-CON) 20 MEQ tablet Take 20 mEq by mouth daily.     potassium chloride SA (KLOR-CON) 20 MEQ tablet Take 20 mEq by mouth daily.      predniSONE (DELTASONE) 1 MG tablet Take 5 mg by mouth daily with breakfast.     predniSONE (DELTASONE) 10 MG tablet Takes  3 tablets for 1 days, then 2 tabs for 1 days, then 1 tab for 1 days, and then stop. 6 tablet 0   SPIRIVA HANDIHALER 18 MCG inhalation capsule PLACE 1 CAPSULE INTO INHALER AND INHALE ONCE DAILY. 90 capsule 0   temazepam (RESTORIL) 30 MG capsule Take 30 mg by mouth at bedtime as needed for sleep.     temazepam (RESTORIL) 30 MG capsule Take 30 mg by mouth at bedtime as needed for sleep.     tiotropium (SPIRIVA) 18 MCG inhalation capsule Place 18 mcg into inhaler and inhale daily as needed (sob/wheezing).     tiotropium (SPIRIVA) 18 MCG inhalation capsule Place 18 mcg into inhaler and inhale daily as needed (for wheezing).     traMADol (ULTRAM) 50 MG tablet Take by mouth every 6 (six) hours as needed.     No current facility-administered medications for this visit.    Review of Systems  Constitutional:  Constitutional negative. HENT: HENT negative.  Eyes: Eyes negative.  Respiratory: Respiratory negative.  Cardiovascular: Positive for leg swelling.  GI: Gastrointestinal negative.  Skin: Positive for wound.  Neurological: Neurological negative. Hematologic: Hematologic/lymphatic negative.  Psychiatric: Psychiatric negative.        Objective:  Objective   Vitals:   07/24/20 1438  BP: (!) 134/66  Pulse: 66  Resp: 16  Temp: (!) 97.5 F (36.4 C)  TempSrc: Temporal  SpO2: 92%  Weight: 185 lb (83.9 kg)  Height: 5\' 7"  (1.702 m)   Body mass index is 28.98 kg/m.  Physical Exam HENT:     Head: Normocephalic.     Nose:     Comments: Mask in place Eyes:     Pupils: Pupils are equal, round, and reactive to light.  Cardiovascular:     Pulses:          Femoral pulses are 2+ on the right side and 2+ on the left side.      Popliteal pulses are 0 on the right side and 0 on the left side.       Dorsalis pedis pulses are detected w/ Doppler on the right side  and detected w/ Doppler on the left side.  Pulmonary:     Effort: Pulmonary effort is normal.  Abdominal:     General: Abdomen is flat.     Palpations: Abdomen is soft. There is no mass.  Musculoskeletal:     Right lower leg: Edema present.     Left lower leg: Edema present.  Skin:    Comments: There is a 3 cm dry ulcer to the left heel.  Neurological:     General: No focal deficit present.     Mental Status: He is alert.  Psychiatric:        Mood  and Affect: Mood normal.        Behavior: Behavior normal.        Thought Content: Thought content normal.        Judgment: Judgment normal.     Data: ABI Findings:  +---------+------------------+-----+----------+--------+   Right   Rt Pressure (mmHg) Index Waveform  Comment    +---------+------------------+-----+----------+--------+   Brachial  173                          +---------+------------------+-----+----------+--------+   ATA    194         1.12  monophasic        +---------+------------------+-----+----------+--------+   PTA    149         0.86  absent          +---------+------------------+-----+----------+--------+   Great Toe 194         1.12               +---------+------------------+-----+----------+--------+   +---------+------------------+-----+----------+-------+   Left    Lt Pressure (mmHg) Index Waveform  Comment   +---------+------------------+-----+----------+-------+   Brachial  160                          +---------+------------------+-----+----------+-------+   ATA    255         1.47  triphasic         +---------+------------------+-----+----------+-------+   PTA    255         1.47  monophasic        +---------+------------------+-----+----------+-------+   Great Toe 168         0.97               +---------+------------------+-----+----------+-------+    +-------+-----------+-----------+------------+------------+   ABI/TBI Today's ABI Today's TBI Previous ABI Previous TBI   +-------+-----------+-----------+------------+------------+   Right  1.12     1.12     1.13     1         +-------+-----------+-----------+------------+------------+   Left   [place]      0.97     [place]      0.93       +-------+-----------+-----------+------------+------------+       Summary:  Right:   ABI is within normal limits, however this seems to be falsely elevated due  to dampened waveforms. Hx noncompressible vessels.  Left: Resting left ankle-brachial index indicates noncompressible left  lower extremity arteries.            Assessment/Plan:     86 year old male with left heel ulceration also has a small left great toe ulcer.  ABIs are noncompressible.  Wound actually appears to be somewhat improved since evaluation with podiatry.  We will schedule him for aortogram with bilateral lower extremity runoff possible intervention of the left and this will need to be done with CO2 given his elevated creatinine.  Unknown whether his previous aortic repair was done with a 2 graft or aortobiiliac bypass which could complicate any vascular intervention at the time of procedure.  I discussed this with the patient and his wife.  Patient actually seems somewhat confused as to the gravity of the situation or at least in different.  We will get him scheduled in the very near future.     Waynetta Sandy MD [facility]

## 2020-07-24 NOTE — Telephone Encounter (Signed)
-----   Message from Bronson Ing, DPM sent at 07/23/2020  6:58 PM EDT ----- Regarding: Patient call message Thanks for handling!   All sounds great-  Im totally fine if she is uncomfortable with giving him pain meds-  no need to fill if he is ok without it.    Thank you!!

## 2020-07-27 ENCOUNTER — Telehealth: Payer: Self-pay | Admitting: Family Medicine

## 2020-07-27 DIAGNOSIS — D631 Anemia in chronic kidney disease: Secondary | ICD-10-CM | POA: Diagnosis not present

## 2020-07-27 DIAGNOSIS — N184 Chronic kidney disease, stage 4 (severe): Secondary | ICD-10-CM | POA: Diagnosis not present

## 2020-07-27 DIAGNOSIS — I130 Hypertensive heart and chronic kidney disease with heart failure and stage 1 through stage 4 chronic kidney disease, or unspecified chronic kidney disease: Secondary | ICD-10-CM | POA: Diagnosis not present

## 2020-07-27 DIAGNOSIS — J441 Chronic obstructive pulmonary disease with (acute) exacerbation: Secondary | ICD-10-CM | POA: Diagnosis not present

## 2020-07-27 DIAGNOSIS — E1122 Type 2 diabetes mellitus with diabetic chronic kidney disease: Secondary | ICD-10-CM | POA: Diagnosis not present

## 2020-07-27 DIAGNOSIS — I5022 Chronic systolic (congestive) heart failure: Secondary | ICD-10-CM | POA: Diagnosis not present

## 2020-07-27 DIAGNOSIS — E1151 Type 2 diabetes mellitus with diabetic peripheral angiopathy without gangrene: Secondary | ICD-10-CM | POA: Diagnosis not present

## 2020-07-27 DIAGNOSIS — J9611 Chronic respiratory failure with hypoxia: Secondary | ICD-10-CM | POA: Diagnosis not present

## 2020-07-27 DIAGNOSIS — L89620 Pressure ulcer of left heel, unstageable: Secondary | ICD-10-CM | POA: Diagnosis not present

## 2020-07-27 NOTE — Telephone Encounter (Signed)
Charisse calling from [place] needing verbal orders for PT for the patient. Call back number [phone]

## 2020-07-28 ENCOUNTER — Telehealth: Payer: Self-pay | Admitting: Family Medicine

## 2020-07-28 DIAGNOSIS — E1151 Type 2 diabetes mellitus with diabetic peripheral angiopathy without gangrene: Secondary | ICD-10-CM | POA: Diagnosis not present

## 2020-07-28 DIAGNOSIS — D631 Anemia in chronic kidney disease: Secondary | ICD-10-CM | POA: Diagnosis not present

## 2020-07-28 DIAGNOSIS — J441 Chronic obstructive pulmonary disease with (acute) exacerbation: Secondary | ICD-10-CM | POA: Diagnosis not present

## 2020-07-28 DIAGNOSIS — I5022 Chronic systolic (congestive) heart failure: Secondary | ICD-10-CM | POA: Diagnosis not present

## 2020-07-28 DIAGNOSIS — I130 Hypertensive heart and chronic kidney disease with heart failure and stage 1 through stage 4 chronic kidney disease, or unspecified chronic kidney disease: Secondary | ICD-10-CM | POA: Diagnosis not present

## 2020-07-28 DIAGNOSIS — N184 Chronic kidney disease, stage 4 (severe): Secondary | ICD-10-CM | POA: Diagnosis not present

## 2020-07-28 DIAGNOSIS — J9611 Chronic respiratory failure with hypoxia: Secondary | ICD-10-CM | POA: Diagnosis not present

## 2020-07-28 DIAGNOSIS — E1122 Type 2 diabetes mellitus with diabetic chronic kidney disease: Secondary | ICD-10-CM | POA: Diagnosis not present

## 2020-07-28 DIAGNOSIS — L89620 Pressure ulcer of left heel, unstageable: Secondary | ICD-10-CM | POA: Diagnosis not present

## 2020-07-28 MED ORDER — PANTOPRAZOLE SODIUM 40 MG PO TBEC: 40.0000 mg | DELAYED_RELEASE_TABLET | Freq: Two times a day (BID) | ORAL | 2 refills | Status: DC

## 2020-07-28 NOTE — Telephone Encounter (Signed)
Medication has been sent to the patient's pharmacy.  

## 2020-07-28 NOTE — Telephone Encounter (Signed)
Wife called stating pt is home from hospital and rehab. Pt has a procedure scheduled (endoscopy) for his legs on August 16th. When pt was in hospital he was prescribed pantoprazole 40 mg BID for swallowing issues. Wife is asking if Dr. Jerline Pain will refill the prescription for pt. Pt prefers [facility]. Please advise.

## 2020-07-28 NOTE — Telephone Encounter (Signed)
Ok with me. Please place any necessary orders. 

## 2020-07-28 NOTE — Telephone Encounter (Signed)
Spoke with Charisse to give the verbal orders for PT per Dr. Jerline Pain. No other questions or concerns at this time.

## 2020-07-29 ENCOUNTER — Telehealth: Payer: Self-pay | Admitting: *Deleted

## 2020-07-29 NOTE — Telephone Encounter (Signed)
-----   Message from Bronson Ing, DPM sent at 07/27/2020  9:22 PM EDT ----- Efraim Kaufmann-  I sent [facility] a direct message last week regarding this patient (on Wednesday)-  I am assuming she hasn't called them judging by this message.  If you could send in the order and then ask her to disregard my earlier message, that would be great.  Orders:   supplies:  silver alginate dressing, covered by absorptive pad or fluffy gauze depending on if its draining a lot or a little.   Apply silver alginate to the wound and cover with pad or gauze-  change every other day.  The patient is toe touch weight bearing.  If they have a surgical shoe to dispense that would be great- if not, we can get him one at his next visit.    He also needs to be in a heel cradle or pillow under knee to keep weight completely off the heel when resting.    (initially I ordered santyl but will hold off on this to address the drainage part first)   Thank you!!! Dr. Johnette Abraham ----- Message ----- From: Viviana Simpler, [place] Sent: 07/27/2020   6:07 PM EDT To: Bronson Ing, DPM  Hey Dr Valentina Lucks, [place] called and stated that they needed information and precautions on physical therapy and is patient weight bearing and does the patient need a special boot and the phone number is [phone] and the wound care orders need to be sent cause it is draining and that number is [phone] and I can call if you tell me what you need. Lattie Haw

## 2020-07-29 NOTE — Telephone Encounter (Signed)
Called the care giver back Luz Lex) [phone] per Dr Valentina Lucks and relayed the message and patient would need to get a surgical shoe and a heel pillow and can be picked up any time. Lattie Haw

## 2020-07-30 ENCOUNTER — Telehealth: Payer: Self-pay

## 2020-07-30 ENCOUNTER — Telehealth: Payer: Self-pay | Admitting: *Deleted

## 2020-07-30 ENCOUNTER — Other Ambulatory Visit (HOSPITAL_COMMUNITY): Payer: Self-pay

## 2020-07-30 NOTE — Telephone Encounter (Signed)
The physical therapist called patient today and spoke to the Pt.'s wife. They refused physical therapy.

## 2020-07-30 NOTE — Progress Notes (Signed)
Paramedicine Encounter    Patient ID: Andrew [name], male    DOB: 02/14/1928, 86 y.o.   MRN: [id number]   Patient Care Team: [name], MD as PCP - General (Family Medicine) Larey Dresser, MD as PCP - Advanced Heart Failure (Cardiology) Larey Dresser, MD as Consulting Physician (Cardiology) Jorge Ny, LCSW as Social Worker (Licensed Clinical Social Worker) Gardiner Barefoot, DPM as Consulting Physician (Podiatry) [name], MD (Family Medicine) Patient, No Pcp Per (General Practice)  Patient Active Problem List   Diagnosis Date Noted  . Acute respiratory failure with hypercapnia ([place]) 06/02/2020  . Acute metabolic encephalopathy 16/96/7893  . CKD (chronic kidney disease), stage IV ([place]) 06/02/2020  . PAF (paroxysmal atrial fibrillation) ([place]) 06/02/2020  . Hypothyroidism 06/02/2020  . Chronic anticoagulation 06/02/2020  . Debility 06/01/2020  . Chronic respiratory failure with hypoxia ([place]) 09/16/2018  . Bilateral recurrent inguinal hernias 11/28/2017  . Carotid artery stenosis 01/12/2016  . Chronic systolic CHF (congestive heart failure) ([place]) 12/02/2015  . 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 PVD (peripheral vascular disease) ([place])   . Hereditary and idiopathic peripheral neuropathy 10/02/2015  . Peripelvic (lymphatic) cyst   . Pernicious anemia 07/15/2015  . Bilateral renal cysts 07/12/2015  . Lung nodule, 47mm RML CT 07/12/15 07/12/2015  . Stage 3 chronic kidney disease 06/14/2015  . Paroxysmal atrial fibrillation ([place]) 05/28/2015  . AAA (abdominal aortic aneurysm) without rupture ([place]) 09/30/2014  . Cardiomyopathy, ischemic 05/23/2014  . DM (diabetes mellitus), type 2 with renal complications ([place]) 81/12/7508  . Depression, major, single episode, complete remission ([place]) 03/18/2010  . Hypothyroidism 02/03/2009  . Osteoarthritis 05/27/2008  . Hyperlipidemia associated with type 2 diabetes mellitus ([place]) 06/14/2007  .  Hypertension associated with diabetes ([place]) 06/14/2007  . GERD 06/14/2007    Current Outpatient Medications:  .  amiodarone (PACERONE) 200 MG tablet, Take 0.5 tablets (100 mg total) by mouth daily., Disp: 45 tablet, Rfl: 3 .  apixaban (ELIQUIS) 2.5 MG TABS tablet, Take 1 tablet (2.5 mg total) by mouth 2 (two) times daily., Disp: 180 tablet, Rfl: 3 .  atorvastatin (LIPITOR) 80 MG tablet, TAKE 1 TABLET EACH DAY. (Patient taking differently: Take 80 mg by mouth daily. ), Disp: 30 tablet, Rfl: 3 .  carvedilol (COREG) 12.5 MG tablet, Take 1 tablet (12.5 mg total) by mouth 2 (two) times daily., Disp: 180 tablet, Rfl: 3 .  cilostazol (PLETAL) 100 MG tablet, Take 1 tablet (100 mg total) by mouth 2 (two) times daily., Disp: 180 tablet, Rfl: 3 .  ezetimibe (ZETIA) 10 MG tablet, TAKE (1) TABLET DAILY., Disp: 90 tablet, Rfl: 0 .  FLUoxetine (PROZAC) 20 MG capsule, TAKE (1) CAPSULE DAILY., Disp: 90 capsule, Rfl: 0 .  furosemide (LASIX) 80 MG tablet, Take 80-120 mg by mouth See admin instructions. Take 1 and 1/2 tablets every morning then take 1 tablet in the afternoon, Disp: , Rfl:  .  gabapentin (NEURONTIN) 100 MG capsule, Take 100 mg by mouth 3 (three) times daily., Disp: , Rfl:  .  hydrALAZINE (APRESOLINE) 50 MG tablet, Take 1 tablet (50 mg total) by mouth 3 (three) times daily., Disp: 270 tablet, Rfl: 3 .  isosorbide mononitrate (IMDUR) 30 MG 24 hr tablet, TAKE 1 TABLET EACH DAY., Disp: 90 tablet, Rfl: 3 .  levothyroxine (SYNTHROID) 100 MCG tablet, TAKE 1 TABLET BEFORE BREAKFAST., Disp: 90 tablet, Rfl: 0 .  Multiple Vitamin (MULTIVITAMIN WITH MINERALS) TABS tablet, Take 1 tablet by mouth  daily., Disp: , Rfl:  .  pantoprazole (PROTONIX) 40 MG tablet, Take 1 tablet (40 mg total) by mouth 2 (two) times daily., Disp: 180 tablet, Rfl: 2 .  pantoprazole (PROTONIX) 40 MG tablet, Take 1 tablet (40 mg total) by mouth 2 (two) times daily., Disp: , Rfl:  .  potassium chloride SA (KLOR-CON) 20 MEQ tablet, TAKE 1  TABLET ONCE DAILY., Disp: 90 tablet, Rfl: 3 .  predniSONE (DELTASONE) 1 MG tablet, Take 5 mg by mouth daily with breakfast., Disp: , Rfl:  .  acetaminophen (TYLENOL) 500 MG tablet, Take 2 tablets (1,000 mg total) by mouth every 8 (eight) hours as needed., Disp: 30 tablet, Rfl: 0 .  acetaminophen (TYLENOL) 500 MG tablet, Take 1,000 mg by mouth every 6 (six) hours as needed for moderate pain or headache., Disp: , Rfl:  .  acetaminophen (TYLENOL) 500 MG tablet, Take 1,000 mg by mouth every 6 (six) hours as needed for mild pain., Disp: , Rfl:  .  amiodarone (PACERONE) 200 MG tablet, Take 100 mg by mouth daily., Disp: , Rfl:  .  amiodarone (PACERONE) 200 MG tablet, Take 100 mg by mouth daily., Disp: , Rfl:  .  amoxicillin-clavulanate (AUGMENTIN) 500-125 MG tablet, Take 1 tablet (500 mg total) by mouth in the morning and at bedtime., Disp: 20 tablet, Rfl: 0 .  apixaban (ELIQUIS) 2.5 MG TABS tablet, Take 2.5 mg by mouth 2 (two) times daily., Disp: , Rfl:  .  atorvastatin (LIPITOR) 80 MG tablet, Take 80 mg by mouth daily., Disp: , Rfl:  .  atorvastatin (LIPITOR) 80 MG tablet, Take 80 mg by mouth daily., Disp: , Rfl:  .  baclofen (LIORESAL) 10 MG tablet, Take 0.5-1 tablets (5-10 mg total) by mouth 3 (three) times daily. (Patient not taking: Reported on 07/24/2020), Disp: 30 each, Rfl: 0 .  baclofen (LIORESAL) 10 MG tablet, Take 10 mg by mouth daily as needed for muscle spasms. , Disp: , Rfl:  .  baclofen (LIORESAL) 10 MG tablet, Take 10 mg by mouth daily as needed for muscle spasms. , Disp: , Rfl:  .  carvedilol (COREG) 12.5 MG tablet, Take 12.5 mg by mouth 2 (two) times daily., Disp: , Rfl:  .  carvedilol (COREG) 12.5 MG tablet, Take 12.5 mg by mouth 2 (two) times daily with a meal., Disp: , Rfl:  .  cephALEXin (KEFLEX) 500 MG capsule, Take 1 capsule (500 mg total) by mouth 4 (four) times daily. (Patient not taking: Reported on 07/24/2020), Disp: 28 capsule, Rfl: 0 .  cilostazol (PLETAL) 100 MG tablet, Take  100 mg by mouth 2 (two) times daily., Disp: , Rfl:  .  ELIQUIS 2.5 MG TABS tablet, Take 2.5 mg by mouth 2 (two) times daily., Disp: , Rfl:  .  ezetimibe (ZETIA) 10 MG tablet, Take 10 mg by mouth daily., Disp: , Rfl:  .  ezetimibe (ZETIA) 10 MG tablet, Take 10 mg by mouth daily., Disp: , Rfl:  .  fluconazole (DIFLUCAN) 100 MG tablet, Take 1 tablet (100 mg total) by mouth daily. (Patient not taking: Reported on 07/30/2020), Disp: 5 tablet, Rfl: 0 .  FLUoxetine (PROZAC) 20 MG capsule, Take 20 mg by mouth daily., Disp: , Rfl:  .  FLUoxetine (PROZAC) 20 MG capsule, Take 20 mg by mouth daily., Disp: , Rfl:  .  furosemide (LASIX) 40 MG tablet, Take 1 tablet (40 mg total) by mouth 2 (two) times daily., Disp: 30 tablet, Rfl:  .  furosemide (LASIX) 80 MG tablet,  TAKE 1&1/2 TABLETS IN THE MORNING AND 1 TABLET IN THE AFTERNOON., Disp: 225 tablet, Rfl: 6 .  gabapentin (NEURONTIN) 100 MG capsule, Take 100 mg by mouth 3 (three) times daily., Disp: , Rfl:  .  glucose blood (ACCU-CHEK AVIVA PLUS) test strip, CHECK BLOOD SUGAR 4 TIMES A DAY., Disp: 100 each, Rfl: 11 .  hydrALAZINE (APRESOLINE) 50 MG tablet, Take 50 mg by mouth 3 (three) times daily., Disp: , Rfl:  .  hydrALAZINE (APRESOLINE) 50 MG tablet, Take 50 mg by mouth 3 (three) times daily., Disp: , Rfl:  .  Infant Care Products [facility] EX), Apply topically in the morning and at bedtime. (Ointment) To buttock/scrotum, Disp: , Rfl:  .  Influenza Vac A&B Surf Ant Adj (FLUAD) 0.5 ML SUSY, , Disp: , Rfl:  .  isosorbide mononitrate (IMDUR) 30 MG 24 hr tablet, Take 30 mg by mouth daily., Disp: , Rfl:  .  isosorbide mononitrate (IMDUR) 30 MG 24 hr tablet, Take 30 mg by mouth daily., Disp: , Rfl:  .  Lancets MISC, Check blood sugar 4 times a day, Disp: 100 each, Rfl: 11 .  levothyroxine (SYNTHROID) 100 MCG tablet, Take 100 mcg by mouth daily., Disp: , Rfl:  .  levothyroxine (SYNTHROID) 100 MCG tablet, Take 100 mcg by mouth daily., Disp: , Rfl:  .  Multiple  Vitamin (MULTIVITAMIN) tablet, Take 1 tablet by mouth daily., Disp: , Rfl:  .  Multiple Vitamins-Minerals (MULTIVITAMIN ADULTS 50+) TABS, Take 1 tablet by mouth daily., Disp: , Rfl:  .  nitroGLYCERIN (NITROSTAT) 0.4 MG SL tablet, DISSOLVE 1 TABLET UNDER TONGUE AS NEEDED FOR CHEST PAIN,MAY REPEAT IN5 MINUTES FOR 2 DOSES., Disp: 25 tablet, Rfl: 3 .  potassium chloride SA (KLOR-CON) 20 MEQ tablet, Take 20 mEq by mouth daily., Disp: , Rfl:  .  potassium chloride SA (KLOR-CON) 20 MEQ tablet, Take 20 mEq by mouth daily., Disp: , Rfl:  .  predniSONE (DELTASONE) 10 MG tablet, Takes  3 tablets for 1 days, then 2 tabs for 1 days, then 1 tab for 1 days, and then stop., Disp: 6 tablet, Rfl: 0 .  SPIRIVA HANDIHALER 18 MCG inhalation capsule, PLACE 1 CAPSULE INTO INHALER AND INHALE ONCE DAILY., Disp: 90 capsule, Rfl: 0 .  temazepam (RESTORIL) 30 MG capsule, Take 30 mg by mouth at bedtime as needed for sleep., Disp: , Rfl:  .  temazepam (RESTORIL) 30 MG capsule, Take 30 mg by mouth at bedtime as needed for sleep., Disp: , Rfl:  .  tiotropium (SPIRIVA) 18 MCG inhalation capsule, Place 18 mcg into inhaler and inhale daily as needed (sob/wheezing)., Disp: , Rfl:  .  tiotropium (SPIRIVA) 18 MCG inhalation capsule, Place 18 mcg into inhaler and inhale daily as needed (for wheezing)., Disp: , Rfl:  .  traMADol (ULTRAM) 50 MG tablet, Take by mouth every 6 (six) hours as needed., Disp: , Rfl:  Allergies  Allergen Reactions  . Metoprolol Shortness Of Breath, Palpitations and Other (See Comments)    Heart starts racing. Shallow breathing; pt states he also get skin irritation on his legs  . Metformin Hives and Swelling    On legs  . Metformin And Related Nausea And Vomiting      Social History   Socioeconomic History  . Marital status: Married    Spouse name: Not on file  . Number of children: Not on file  . Years of education: Not on file  . Highest education level: Not on file  Occupational History  .  Occupation: Retired  Tobacco Use  . Smoking status: Never Smoker  . Smokeless tobacco: Never Used  . Tobacco comment: quit atleast 25 yrs ago, per pt  Vaping Use  . Vaping Use: Never used  Substance and Sexual Activity  . Alcohol use: Never    Comment: socially  . Drug use: Never  . Sexual activity: Not Currently  Other Topics Concern  . Not on file  Social History Narrative   ** Merged History Encounter **       Pt lives in [place] with spouse. Retired from [employer].  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   Social Determinants of Health   Financial Resource Strain:   . Difficulty of Paying Living Expenses:   Food Insecurity:   . Worried About Charity fundraiser in the Last Year:   . Arboriculturist in the Last Year:   Transportation Needs:   . Film/video editor (Medical):   Marland Kitchen Lack of Transportation (Non-Medical):   Physical Activity:   . Days of Exercise per Week:   . Minutes of Exercise per Session:   Stress:   . Feeling of Stress :   Social Connections:   . Frequency of Communication with Friends and Family:   . Frequency of Social Gatherings with Friends and Family:   . Attends Religious Services:   . Active Member of Clubs or Organizations:   . Attends Archivist Meetings:   Marland Kitchen Marital Status:   Intimate Partner Violence:   . Fear of Current or Ex-Partner:   . Emotionally Abused:   Marland Kitchen Physically Abused:   . Sexually Abused:     Physical Exam Cardiovascular:     Rate and Rhythm: Normal rate and regular rhythm.     Pulses: Normal pulses.  Pulmonary:     Effort: Pulmonary effort is normal.     Breath sounds: Normal breath sounds.  Musculoskeletal:        General: Normal range of motion.     Left lower leg: Edema present.  Skin:    General: Skin is warm and dry.     Capillary Refill: Capillary refill takes less than 2 seconds.  Neurological:     Mental Status: He is alert and oriented  to person, place, and time.  Psychiatric:        Mood and Affect: Mood normal.         Future Appointments  Date Time Provider [place]  08/04/2020  3:30 PM Evelina Bucy, DPM [facility]  08/07/2020  2:10 PM [facility] None  09/08/2020 12:00 PM Larey Dresser, MD [facility] None    BP (!) 149/71 (BP Location: Left Arm, Patient Position: Sitting, Cuff Size: Normal)   Pulse 71   Resp 17   Wt 178 lb 12.8 oz (81.1 kg)   SpO2 96%   BMI 28.00 kg/m   Weight yesterday- did not weigh  Last visit weight- n/a  Mr Brinegar was seen at home today and reported feeling well. He denied chest pain, SOB, headache, dizziness, orthopnea, fever or cough over the past week. He stated he has been compliant with his medications and his weight is down. His medications were verified and his pillbox was refilled. I will follow up next week.   [name], EMT 07/30/20  ACTION: Home visit completed Next visit planned for 1 week

## 2020-07-30 NOTE — Telephone Encounter (Signed)
FYI

## 2020-07-30 NOTE — Telephone Encounter (Signed)
Per [place]-patient's wife will be by to get a heel pillow and a surgical shoe for the patient. Lattie Haw

## 2020-08-04 ENCOUNTER — Other Ambulatory Visit: Payer: Self-pay

## 2020-08-04 ENCOUNTER — Ambulatory Visit: Payer: Medicare PPO | Admitting: Podiatry

## 2020-08-04 DIAGNOSIS — L89623 Pressure ulcer of left heel, stage 3: Secondary | ICD-10-CM | POA: Diagnosis not present

## 2020-08-04 DIAGNOSIS — I739 Peripheral vascular disease, unspecified: Secondary | ICD-10-CM

## 2020-08-04 MED ORDER — HYDROCODONE-ACETAMINOPHEN 5-325 MG PO TABS: 1.0000 | ORAL_TABLET | Freq: Four times a day (QID) | ORAL | 0 refills | Status: DC | PRN

## 2020-08-04 NOTE — Progress Notes (Signed)
  Subjective:  Patient ID: Andrew [name], male    DOB: 01/09/1928,  MRN: [id number]  Chief Complaint  Patient presents with  . Wound Check    Pt states right heel ulcer is very painful and has had drainage. Denies fever/chills/nausea/vomiting.    86 y.o. male presents for wound care. Hx confirmed with patient.  States the wound is doing better has home health care nurse but not sure what they are applying. Objective:  Physical Exam: Wound Location: Left heel Wound Measurement: 4.5x4 Wound Base: Mixed Granular/Fibrotic Fibrotic slough Peri-wound: Normal Exudate: Scant/small amount Serosanguinous exudate wound without warmth, erythema, signs of acute infection  Foot cool to touch faintly palpable pulses left foot   Assessment:   1. Decubitus ulcer of left heel, stage 3 ([name])   2. Peripheral vascular disease ([name])    Plan:  Patient was evaluated and treated and all questions answered.  Ulcer left heel -Dressing applied consisting of medihoney and wet-to-dry gauze -We will check on the status of Santyl -Offload ulcer with surgical shoe for ambulation -Surgical shoe dispensed -Wound cleansed and debrided -Discussed the importance of floating the heel at all times while not weightbearing.    Procedure: Selective Debridement of Wound Rationale: Removal of devitalized tissue from the wound to promote healing.  Pre-Debridement Wound Measurements: 4.5 cm x 4 cm x 0.2 cm  Post-Debridement Wound Measurements: same as pre-debridement. Type of Debridement: sharp selective Tissue Removed: Devitalized soft-tissue Dressing: Dry, sterile, compression dressing. Disposition: Patient tolerated procedure well. Patient to return in 1 week for follow-up.  Peripheral arterial disease - scheduled for angiogram next week  Return in about 2 weeks (around 08/18/2020).

## 2020-08-06 ENCOUNTER — Other Ambulatory Visit (HOSPITAL_COMMUNITY): Payer: Self-pay

## 2020-08-06 ENCOUNTER — Telehealth: Payer: Self-pay | Admitting: *Deleted

## 2020-08-06 DIAGNOSIS — J441 Chronic obstructive pulmonary disease with (acute) exacerbation: Secondary | ICD-10-CM | POA: Diagnosis not present

## 2020-08-06 DIAGNOSIS — I5022 Chronic systolic (congestive) heart failure: Secondary | ICD-10-CM | POA: Diagnosis not present

## 2020-08-06 DIAGNOSIS — D631 Anemia in chronic kidney disease: Secondary | ICD-10-CM | POA: Diagnosis not present

## 2020-08-06 DIAGNOSIS — N184 Chronic kidney disease, stage 4 (severe): Secondary | ICD-10-CM | POA: Diagnosis not present

## 2020-08-06 DIAGNOSIS — L89620 Pressure ulcer of left heel, unstageable: Secondary | ICD-10-CM | POA: Diagnosis not present

## 2020-08-06 DIAGNOSIS — I130 Hypertensive heart and chronic kidney disease with heart failure and stage 1 through stage 4 chronic kidney disease, or unspecified chronic kidney disease: Secondary | ICD-10-CM | POA: Diagnosis not present

## 2020-08-06 DIAGNOSIS — J9611 Chronic respiratory failure with hypoxia: Secondary | ICD-10-CM | POA: Diagnosis not present

## 2020-08-06 DIAGNOSIS — E1151 Type 2 diabetes mellitus with diabetic peripheral angiopathy without gangrene: Secondary | ICD-10-CM | POA: Diagnosis not present

## 2020-08-06 DIAGNOSIS — E1122 Type 2 diabetes mellitus with diabetic chronic kidney disease: Secondary | ICD-10-CM | POA: Diagnosis not present

## 2020-08-06 NOTE — Telephone Encounter (Signed)
I spoke with [facility], she states they have the script available for around $40.00/120gm, but have ben unable to contact pt. I informed of pt's wife, Suzanne's mobile number.

## 2020-08-06 NOTE — Telephone Encounter (Signed)
-----   Message from Evelina Bucy, DPM sent at 08/04/2020 10:37 PM EDT ----- Can we check on this patient patient's Santyl prescription they did not appear to get

## 2020-08-06 NOTE — Progress Notes (Signed)
Paramedicine Encounter    Patient ID: Andrew [name], male    DOB: 07-Dec-1928, 86 y.o.   MRN: [id number]   Patient Care Team: [name], MD as PCP - General (Family Medicine) Larey Dresser, MD as PCP - Advanced Heart Failure (Cardiology) Larey Dresser, MD as Consulting Physician (Cardiology) Jorge Ny, LCSW as Social Worker (Licensed Clinical Social Worker) Gardiner Barefoot, DPM as Consulting Physician (Podiatry) [name], MD (Family Medicine) Patient, No Pcp Per (General Practice)  Patient Active Problem List   Diagnosis Date Noted   Acute respiratory failure with hypercapnia ([place]) 28/36/6294   Acute metabolic encephalopathy 76/54/6503   CKD (chronic kidney disease), stage IV ([place]) 06/02/2020   PAF (paroxysmal atrial fibrillation) ([place]) 06/02/2020   Hypothyroidism 06/02/2020   Chronic anticoagulation 06/02/2020   Debility 06/01/2020   Lumbar spondylosis 10/04/2018   Chronic respiratory failure with hypoxia ([place]) 09/16/2018   Bilateral recurrent inguinal hernias 11/28/2017   Carotid artery stenosis 54/65/6812   Chronic systolic CHF (congestive heart failure) ([place]) 12/02/2015   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 ([place]) 10/16/2015   PVD (peripheral vascular disease) ([place])    Hereditary and idiopathic peripheral neuropathy 10/02/2015   Peripelvic (lymphatic) cyst    Pernicious anemia 07/15/2015   Bilateral renal cysts 07/12/2015   Lung nodule, 16mm RML CT 07/12/15 07/12/2015   Stage 3 chronic kidney disease 06/14/2015   Paroxysmal atrial fibrillation ([place]) 05/28/2015   AAA (abdominal aortic aneurysm) without rupture ([place]) 09/30/2014   Cardiomyopathy, ischemic 05/23/2014   DM (diabetes mellitus), type 2 with renal complications ([place]) 75/17/0017   Depression, major, single episode, complete remission ([place]) 03/18/2010   Hypothyroidism 02/03/2009   Osteoarthritis 05/27/2008   Hyperlipidemia associated with type 2  diabetes mellitus ([place]) 06/14/2007   Hypertension associated with diabetes ([place]) 06/14/2007   GERD 06/14/2007    Current Outpatient Medications:    acetaminophen (TYLENOL) 500 MG tablet, Take 2 tablets (1,000 mg total) by mouth every 8 (eight) hours as needed., Disp: 30 tablet, Rfl: 0   amiodarone (PACERONE) 200 MG tablet, Take 100 mg by mouth daily., Disp: , Rfl:    apixaban (ELIQUIS) 2.5 MG TABS tablet, Take 2.5 mg by mouth 2 (two) times daily., Disp: , Rfl:    atorvastatin (LIPITOR) 80 MG tablet, Take 80 mg by mouth daily., Disp: , Rfl:    carvedilol (COREG) 12.5 MG tablet, Take 12.5 mg by mouth 2 (two) times daily with a meal., Disp: , Rfl:    cilostazol (PLETAL) 100 MG tablet, Take 1 tablet (100 mg total) by mouth 2 (two) times daily., Disp: 180 tablet, Rfl: 3   ezetimibe (ZETIA) 10 MG tablet, Take 10 mg by mouth daily., Disp: , Rfl:    FLUoxetine (PROZAC) 20 MG capsule, Take 20 mg by mouth daily., Disp: , Rfl:    furosemide (LASIX) 80 MG tablet, Take 80-120 mg by mouth See admin instructions. Take 1 and 1/2 tablets every morning then take 1 tablet in the afternoon, Disp: , Rfl:    gabapentin (NEURONTIN) 100 MG capsule, Take 100 mg by mouth 3 (three) times daily., Disp: , Rfl:    hydrALAZINE (APRESOLINE) 50 MG tablet, Take 50 mg by mouth 3 (three) times daily., Disp: , Rfl:    HYDROcodone-acetaminophen (NORCO) 5-325 MG tablet, Take 1 tablet by mouth every 6 (six) hours as needed for moderate pain., Disp: 12 tablet, Rfl: 0   isosorbide mononitrate (IMDUR) 30 MG 24 hr tablet, Take 30 mg by mouth  daily., Disp: , Rfl:    levothyroxine (SYNTHROID) 100 MCG tablet, Take 100 mcg by mouth daily., Disp: , Rfl:    Multiple Vitamins-Minerals (MULTIVITAMIN ADULTS 50+) TABS, Take 1 tablet by mouth daily., Disp: , Rfl:    pantoprazole (PROTONIX) 40 MG tablet, Take 1 tablet (40 mg total) by mouth 2 (two) times daily., Disp: , Rfl:    potassium chloride SA (KLOR-CON) 20 MEQ tablet, Take  20 mEq by mouth daily., Disp: , Rfl:    SANTYL ointment, Apply topically., Disp: , Rfl:    tiotropium (SPIRIVA) 18 MCG inhalation capsule, Place 18 mcg into inhaler and inhale daily as needed (for wheezing)., Disp: , Rfl:    acetaminophen (TYLENOL) 500 MG tablet, Take 1,000 mg by mouth every 6 (six) hours as needed for mild pain., Disp: , Rfl:    glucose blood (ACCU-CHEK AVIVA PLUS) test strip, CHECK BLOOD SUGAR 4 TIMES A DAY., Disp: 100 each, Rfl: 11   nitroGLYCERIN (NITROSTAT) 0.4 MG SL tablet, DISSOLVE 1 TABLET UNDER TONGUE AS NEEDED FOR CHEST PAIN,MAY REPEAT IN5 MINUTES FOR 2 DOSES. (Patient taking differently: Place 0.4 mg under the tongue every 5 (five) minutes as needed for chest pain. DISSOLVE 1 TABLET UNDER TONGUE AS NEEDED FOR CHEST PAIN,MAY REPEAT IN5 MINUTES FOR 2 DOSES.), Disp: 25 tablet, Rfl: 3 Allergies  Allergen Reactions   Metoprolol Shortness Of Breath, Palpitations and Other (See Comments)    Heart starts racing. Shallow breathing; pt states he also get skin irritation on his legs   Metformin Hives and Swelling    On legs   Metformin And Related Nausea And Vomiting      Social History   Socioeconomic History   Marital status: Married    Spouse name: Not on file   Number of children: Not on file   Years of education: Not on file   Highest education level: Not on file  Occupational History   Occupation: Retired  Tobacco Use   Smoking status: Never Smoker   Smokeless tobacco: Never Used   Tobacco comment: quit atleast 25 yrs ago, per pt  Vaping Use   Vaping Use: Never used  Substance and Sexual Activity   Alcohol use: Never    Comment: socially   Drug use: Never   Sexual activity: Not Currently  Other Topics Concern   Not on file  Social History Narrative   ** Merged History Encounter **       Pt lives in [place] with spouse. Retired from [employer].  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   Social Determinants of Health   Financial Resource Strain:    Difficulty of Paying Living Expenses:   Food Insecurity:    Worried About Charity fundraiser in the Last Year:    Arboriculturist in the Last Year:   Transportation Needs:    Film/video editor (Medical):    Lack of Transportation (Non-Medical):   Physical Activity:    Days of Exercise per Week:    Minutes of Exercise per Session:   Stress:    Feeling of Stress :   Social Connections:    Frequency of Communication with Friends and Family:    Frequency of Social Gatherings with Friends and Family:    Attends Religious Services:    Active Member of Clubs or Organizations:    Attends Archivist Meetings:    Marital Status:   Intimate Partner Violence:    Fear  of Current or Ex-Partner:    Emotionally Abused:    Physically Abused:    Sexually Abused:     Physical Exam Cardiovascular:     Rate and Rhythm: Normal rate and regular rhythm.     Pulses: Normal pulses.  Pulmonary:     Effort: Pulmonary effort is normal.     Breath sounds: Normal breath sounds.  Musculoskeletal:        General: Normal range of motion.     Right lower leg: Edema present.     Left lower leg: Edema present.  Skin:    General: Skin is warm and dry.     Capillary Refill: Capillary refill takes less than 2 seconds.  Neurological:     Mental Status: He is alert and oriented to person, place, and time.  Psychiatric:        Mood and Affect: Mood normal.         Future Appointments  Date Time Provider [place]  08/07/2020  2:10 PM [facility] None  08/21/2020 11:15 AM Evelina Bucy, DPM [facility]  09/08/2020 12:00 PM Larey Dresser, MD [facility] None    BP (!) 117/55 (BP Location: Left Arm, Patient Position: Sitting, Cuff Size: Normal)    Pulse 84    Resp 16    Wt 185 lb (83.9 kg)    SpO2 93%    BMI 28.98 kg/m   Weight yesterday- did not weigh  Last  visit weight- 178. 8 lb  Mr Detloff was seen at home today and reported feeling well. He denied chest pain, SOB, headache, dizziness, orthopnea, fever or cough over the past week. He stated he has been compliant with his medications however his weight has increased since our last visit. I brought this to his attention and reminded him to avoid salt and limit fluids. He did have BLEE which is persistent for him but no other symptoms were noted. He was understanding and agreeable about minding his diet and he stated would be more careful this week. His medications were verified and his pillbox was refilled. Of note, eliquis was held after Friday evening at the request of Dr Claretha Cooper office due to an upcoming procedure on Monday. I will follow up next week.   [name], EMT 08/06/20  ACTION: Home visit completed Next visit planned for 1 week

## 2020-08-07 ENCOUNTER — Ambulatory Visit: Payer: Medicare PPO | Admitting: Podiatry

## 2020-08-07 ENCOUNTER — Other Ambulatory Visit (HOSPITAL_COMMUNITY)
Admission: RE | Admit: 2020-08-07 | Discharge: 2020-08-07 | Disposition: A | Discharge status: Home / Self Care | Payer: Medicare PPO | Source: Ambulatory Visit | Attending: Vascular Surgery | Admitting: Vascular Surgery

## 2020-08-07 DIAGNOSIS — Z20822 Contact with and (suspected) exposure to covid-19: Secondary | ICD-10-CM | POA: Insufficient documentation

## 2020-08-07 DIAGNOSIS — Z01812 Encounter for preprocedural laboratory examination: Secondary | ICD-10-CM | POA: Diagnosis not present

## 2020-08-07 LAB — SARS CORONAVIRUS 2 (TAT 6-24 HRS): SARS Coronavirus 2: NEGATIVE

## 2020-08-10 ENCOUNTER — Encounter (HOSPITAL_COMMUNITY)
Admission: RE | Disposition: A | Discharge status: Home / Self Care | Payer: Self-pay | Source: Home / Self Care | Attending: Vascular Surgery

## 2020-08-10 ENCOUNTER — Ambulatory Visit (HOSPITAL_COMMUNITY)
Admission: RE | Admit: 2020-08-10 | Discharge: 2020-08-10 | Disposition: A | Discharge status: Home / Self Care | Payer: Medicare PPO | Attending: Vascular Surgery | Admitting: Vascular Surgery

## 2020-08-10 ENCOUNTER — Other Ambulatory Visit: Payer: Self-pay

## 2020-08-10 DIAGNOSIS — Z79890 Hormone replacement therapy (postmenopausal): Secondary | ICD-10-CM | POA: Insufficient documentation

## 2020-08-10 DIAGNOSIS — J449 Chronic obstructive pulmonary disease, unspecified: Secondary | ICD-10-CM | POA: Diagnosis not present

## 2020-08-10 DIAGNOSIS — E079 Disorder of thyroid, unspecified: Secondary | ICD-10-CM | POA: Diagnosis not present

## 2020-08-10 DIAGNOSIS — I110 Hypertensive heart disease with heart failure: Secondary | ICD-10-CM | POA: Diagnosis not present

## 2020-08-10 DIAGNOSIS — Z951 Presence of aortocoronary bypass graft: Secondary | ICD-10-CM | POA: Insufficient documentation

## 2020-08-10 DIAGNOSIS — F329 Major depressive disorder, single episode, unspecified: Secondary | ICD-10-CM | POA: Insufficient documentation

## 2020-08-10 DIAGNOSIS — E559 Vitamin D deficiency, unspecified: Secondary | ICD-10-CM | POA: Diagnosis not present

## 2020-08-10 DIAGNOSIS — I70244 Atherosclerosis of native arteries of left leg with ulceration of heel and midfoot: Secondary | ICD-10-CM | POA: Diagnosis not present

## 2020-08-10 DIAGNOSIS — E785 Hyperlipidemia, unspecified: Secondary | ICD-10-CM | POA: Insufficient documentation

## 2020-08-10 DIAGNOSIS — L97429 Non-pressure chronic ulcer of left heel and midfoot with unspecified severity: Secondary | ICD-10-CM | POA: Insufficient documentation

## 2020-08-10 DIAGNOSIS — I2510 Atherosclerotic heart disease of native coronary artery without angina pectoris: Secondary | ICD-10-CM | POA: Diagnosis not present

## 2020-08-10 DIAGNOSIS — Z7901 Long term (current) use of anticoagulants: Secondary | ICD-10-CM | POA: Diagnosis not present

## 2020-08-10 DIAGNOSIS — E1151 Type 2 diabetes mellitus with diabetic peripheral angiopathy without gangrene: Secondary | ICD-10-CM | POA: Diagnosis not present

## 2020-08-10 DIAGNOSIS — I480 Paroxysmal atrial fibrillation: Secondary | ICD-10-CM | POA: Diagnosis not present

## 2020-08-10 DIAGNOSIS — K219 Gastro-esophageal reflux disease without esophagitis: Secondary | ICD-10-CM | POA: Insufficient documentation

## 2020-08-10 DIAGNOSIS — Z79899 Other long term (current) drug therapy: Secondary | ICD-10-CM | POA: Insufficient documentation

## 2020-08-10 DIAGNOSIS — I483 Typical atrial flutter: Secondary | ICD-10-CM | POA: Diagnosis not present

## 2020-08-10 DIAGNOSIS — I714 Abdominal aortic aneurysm, without rupture: Secondary | ICD-10-CM | POA: Diagnosis not present

## 2020-08-10 DIAGNOSIS — I6523 Occlusion and stenosis of bilateral carotid arteries: Secondary | ICD-10-CM | POA: Diagnosis not present

## 2020-08-10 DIAGNOSIS — D649 Anemia, unspecified: Secondary | ICD-10-CM | POA: Insufficient documentation

## 2020-08-10 DIAGNOSIS — Z86718 Personal history of other venous thrombosis and embolism: Secondary | ICD-10-CM | POA: Insufficient documentation

## 2020-08-10 DIAGNOSIS — I509 Heart failure, unspecified: Secondary | ICD-10-CM | POA: Insufficient documentation

## 2020-08-10 DIAGNOSIS — E11621 Type 2 diabetes mellitus with foot ulcer: Secondary | ICD-10-CM | POA: Insufficient documentation

## 2020-08-10 HISTORY — PX: ABDOMINAL AORTOGRAM W/LOWER EXTREMITY: CATH118223

## 2020-08-10 LAB — POCT I-STAT, CHEM 8
BUN: 29 mg/dL — ABNORMAL HIGH (ref 8–23)
Calcium, Ion: 1.23 mmol/L (ref 1.15–1.40)
Chloride: 98 mmol/L (ref 98–111)
Creatinine, Ser: 2.1 mg/dL — ABNORMAL HIGH (ref 0.61–1.24)
Glucose, Bld: 136 mg/dL — ABNORMAL HIGH (ref 70–99)
HCT: 29 % — ABNORMAL LOW (ref 39.0–52.0)
Hemoglobin: 9.9 g/dL — ABNORMAL LOW (ref 13.0–17.0)
Potassium: 3.7 mmol/L (ref 3.5–5.1)
Sodium: 143 mmol/L (ref 135–145)
TCO2: 30 mmol/L (ref 22–32)

## 2020-08-10 SURGERY — ABDOMINAL AORTOGRAM W/LOWER EXTREMITY
Anesthesia: LOCAL | Laterality: Left

## 2020-08-10 MED ORDER — HEPARIN (PORCINE) IN NACL 1000-0.9 UT/500ML-% IV SOLN
INTRAVENOUS | Status: AC
  Filled 2020-08-10: qty 500

## 2020-08-10 MED ORDER — SODIUM CHLORIDE 0.9 % IV SOLN: INTRAVENOUS | Status: DC

## 2020-08-10 MED ORDER — LIDOCAINE HCL (PF) 1 % IJ SOLN
INTRAMUSCULAR | Status: DC | PRN
  Administered 2020-08-10: 15 mL via INTRADERMAL

## 2020-08-10 MED ORDER — IODIXANOL 320 MG/ML IV SOLN
INTRAVENOUS | Status: DC | PRN
  Administered 2020-08-10: 20 mL via INTRA_ARTERIAL

## 2020-08-10 MED ORDER — HEPARIN (PORCINE) IN NACL 1000-0.9 UT/500ML-% IV SOLN
INTRAVENOUS | Status: DC | PRN
  Administered 2020-08-10: 500 mL

## 2020-08-10 MED ORDER — SODIUM CHLORIDE 0.9 % WEIGHT BASED INFUSION: 1.0000 mL/kg/h | INTRAVENOUS | Status: DC

## 2020-08-10 MED ORDER — LIDOCAINE HCL (PF) 1 % IJ SOLN
INTRAMUSCULAR | Status: AC
  Filled 2020-08-10: qty 30

## 2020-08-10 SURGICAL SUPPLY — 17 items
CATH NAVICROSS ANG 65CM (CATHETERS) IMPLANT
CATH OMNI FLUSH 5F 65CM (CATHETERS) ×1 IMPLANT
CATH SOFT-VU ST 4F 90CM (CATHETERS) ×1 IMPLANT
CATHETER NAVICROSS ANG 65CM (CATHETERS) ×2
FILTER CO2 0.2 MICRON (VASCULAR PRODUCTS) ×1 IMPLANT
GLIDEWIRE ADV .035X180CM (WIRE) ×1 IMPLANT
KIT MICROPUNCTURE NIT STIFF (SHEATH) ×2 IMPLANT
KIT PV (KITS) ×2 IMPLANT
RESERVOIR CO2 (VASCULAR PRODUCTS) ×1 IMPLANT
SET FLUSH CO2 (MISCELLANEOUS) ×1 IMPLANT
SHEATH PINNACLE 5F 10CM (SHEATH) ×2 IMPLANT
SHEATH PROBE COVER 6X72 (BAG) ×1 IMPLANT
SYR MEDRAD MARK V 150ML (SYRINGE) ×1 IMPLANT
TRANSDUCER W/STOPCOCK (MISCELLANEOUS) ×2 IMPLANT
TRAY PV CATH (CUSTOM PROCEDURE TRAY) ×2 IMPLANT
WIRE BENTSON .035X145CM (WIRE) ×1 IMPLANT
WIRE ROSEN-J .035X180CM (WIRE) ×1 IMPLANT

## 2020-08-10 NOTE — Interval H&P Note (Signed)
History and Physical Interval Note:  08/10/2020 8:45 AM  Andrew [name]  has presented today for surgery, with the diagnosis of PVD.  The various methods of treatment have been discussed with the patient and family. After consideration of risks, benefits and other options for treatment, the patient has consented to  Procedure(s): ABDOMINAL AORTOGRAM W/LOWER EXTREMITY (Left) as a surgical intervention.  The patient's history has been reviewed, patient examined, no change in status, stable for surgery.  I have reviewed the patient's chart and labs.  Questions were answered to the patient's satisfaction.     Servando Snare, MD

## 2020-08-10 NOTE — Op Note (Signed)
    Patient name: Andrew [name] MRN: [id number] DOB: 03/31/1928 Sex: male  08/10/2020 Pre-operative Diagnosis: Left heel ulceration Post-operative diagnosis:  Same Surgeon:  Erlene Quan C. Donzetta Matters, MD Procedure Performed: 1.  Ultrasound-guided cannulation right common femoral artery 2.  CO2 and contrasted aortogram and limited left lower extremity angiography   Indications: 86 year old male with left heel ulceration.  He has been under the care of podiatry does have some improvement in the wound.  He is indicated for angiography with possible intervention.  Findings: Patient has heavily calcified vessels.  We were able to get access into the right common femoral artery but I did not place a closure device.  He has history of aortic repair I could not identify this but he does have very heavily calcified and tortuous iliac arteries.  Aorta appeared patent.  Common iliac artery on the left appeared to have calcified disease but with pullback gradient there was no change.  Left common external leg arteries were heavily tortuous I could not get a catheter down but could get a wire down the common femoral artery which also appeared diseased.  Left lower extremity limited angiography demonstrates the SFA is heavily calcified throughout likely with multiple greater than 50% stenosis but never frankly occludes.  Given the heavy calcification that his wound is stable that he is not a candidate for endovascular repair I elected not to intervene.  On the right side appears the SFA is occluded and reconstitutes above the knee.  If patient cannot heal his left heel ulcer he will likely be best served with primary left above-knee amputation.   Procedure:  The patient was identified in the holding area and taken to room 8.  The patient was then placed supine on the table and prepped and draped in the usual sterile fashion.  A time out was called.  Ultrasound was used to evaluate the right common femoral artery which  was noted to be heavily calcified.  This was cannulated micropuncture needle followed by wire and sheath under ultrasound guidance.  Images saved the permanent record.  We ultimately had a placed a Rosen wire which were able to direct into the aorta and placed a 5 French sheath and Omni catheter which we got just of the distal L1 performed CO2 aortogram then limited contrasted aortogram.  There was heavy calcification throughout.  Using Omni catheter and Glidewire advantage we able to cross over the bifurcation.  After multiple attempts I did get the Glidewire into the common femoral artery could not get a straight catheter or a Nava cross catheter to cross past the bifurcation of the common leg artery.  I then performed a limited left lower extremity angiography which demonstrated heavy calcification multiple areas of approximately 50% stenosis throughout his SFA.  I do not want to stick antegrade because he also has common femoral disease on the left.  I do not think patient is a candidate for any additional procedures.  Hopefully with wound care we can get his left heel to heal up otherwise patient would need above-knee amputation on the left.  We then remove the catheter and wire.  Sheath will be pulled in postoperative holding.  Contrast: 20 cc   Uriah Trueba C. Donzetta Matters, MD [facility] Office: [phone] Pager: [phone]

## 2020-08-10 NOTE — Discharge Instructions (Signed)
Restart eliquis tomorrow Angiogram, Care After This sheet gives you information about how to care for yourself after your procedure. Your health care provider may also give you more specific instructions. If you have problems or questions, contact your health care provider. What can I expect after the procedure? After the procedure, it is common to have bruising and tenderness at the catheter insertion area. Follow these instructions at home: Insertion site care  Follow instructions from your health care provider about how to take care of your insertion site. Make sure you: ? Wash your hands with soap and water before you change your bandage (dressing). If soap and water are not available, use hand sanitizer. ? Change your dressing as told by your health care provider. ? Leave stitches (sutures), skin glue, or adhesive strips in place. These skin closures may need to stay in place for 2 weeks or longer. If adhesive strip edges start to loosen and curl up, you may trim the loose edges. Do not remove adhesive strips completely unless your health care provider tells you to do that.  Do not take baths, swim, or use a hot tub until your health care provider approves.  You may shower 24-48 hours after the procedure or as told by your health care provider. ? Gently wash the site with plain soap and water. ? Pat the area dry with a clean towel. ? Do not rub the site. This may cause bleeding.  Do not apply powder or lotion to the site. Keep the site clean and dry.  Check your insertion site every day for signs of infection. Check for: ? Redness, swelling, or pain. ? Fluid or blood. ? Warmth. ? Pus or a bad smell. Activity  Rest as told by your health care provider, usually for 1-2 days.  Do not lift anything that is heavier than 10 lbs. (4.5 kg) or as told by your health care provider.  Do not drive for 24 hours if you were given a medicine to help you relax (sedative).  Do not drive or use  heavy machinery while taking prescription pain medicine. General instructions   Return to your normal activities as told by your health care provider, usually in about a week. Ask your health care provider what activities are safe for you.  If the catheter site starts bleeding, lie flat and put pressure on the site. If the bleeding does not stop, get help right away. This is a medical emergency.  Drink enough fluid to keep your urine clear or pale yellow. This helps flush the contrast dye from your body.  Take over-the-counter and prescription medicines only as told by your health care provider.  Keep all follow-up visits as told by your health care provider. This is important. Contact a health care provider if:  You have a fever or chills.  You have redness, swelling, or pain around your insertion site.  You have fluid or blood coming from your insertion site.  The insertion site feels warm to the touch.  You have pus or a bad smell coming from your insertion site.  You have bruising around the insertion site.  You notice blood collecting in the tissue around the catheter site (hematoma). The hematoma may be painful to the touch. Get help right away if:  You have severe pain at the catheter insertion area.  The catheter insertion area swells very fast.  The catheter insertion area is bleeding, and the bleeding does not stop when you hold steady pressure  on the area.  The area near or just beyond the catheter insertion site becomes pale, cool, tingly, or numb. These symptoms may represent a serious problem that is an emergency. Do not wait to see if the symptoms will go away. Get medical help right away. Call your local emergency services (911 in the [place] Do not drive yourself to the hospital. Summary  After the procedure, it is common to have bruising and tenderness at the catheter insertion area.  After the procedure, it is important to rest and drink plenty of  fluids.  Do not take baths, swim, or use a hot tub until your health care provider says it is okay to do so. You may shower 24-48 hours after the procedure or as told by your health care provider.  If the catheter site starts bleeding, lie flat and put pressure on the site. If the bleeding does not stop, get help right away. This is a medical emergency. This information is not intended to replace advice given to you by your health care provider. Make sure you discuss any questions you have with your health care provider. Document Revised: 11/24/2017 Document Reviewed: 11/16/2016 Elsevier Patient Education  2020 [employer].

## 2020-08-10 NOTE — Progress Notes (Signed)
Site area- right  Site Prior to Removal- 0   Pressure Applied For-  20 MInutes   Bedrest Beginning at - 1115   Manual- Yes   Patient Status During Pull- Stable    Post Pull Groin Site- 0   Post Pull Instructions Given- Yes   Post Pull Pulses Present- Yes    Dressing Applied- Tegaderm and Gauze Dressing    Comments:  doppler pt

## 2020-08-11 ENCOUNTER — Encounter (HOSPITAL_COMMUNITY): Payer: Self-pay | Admitting: Vascular Surgery

## 2020-08-13 ENCOUNTER — Other Ambulatory Visit (HOSPITAL_COMMUNITY): Payer: Self-pay

## 2020-08-13 NOTE — Progress Notes (Signed)
Paramedicine Encounter    Patient ID: Andrew [name], male    DOB: 16-Aug-1928, 86 y.o.   MRN: [id number]   Patient Care Team: [name], MD as PCP - General (Family Medicine) Larey Dresser, MD as PCP - Advanced Heart Failure (Cardiology) Larey Dresser, MD as Consulting Physician (Cardiology) Jorge Ny, LCSW as Social Worker (Licensed Clinical Social Worker) Gardiner Barefoot, DPM as Consulting Physician (Podiatry) [name], MD (Family Medicine) Patient, No Pcp Per (General Practice)  Patient Active Problem List   Diagnosis Date Noted   Acute respiratory failure with hypercapnia ([place]) 07/37/1062   Acute metabolic encephalopathy 69/48/5462   CKD (chronic kidney disease), stage IV ([place]) 06/02/2020   PAF (paroxysmal atrial fibrillation) ([place]) 06/02/2020   Hypothyroidism 06/02/2020   Chronic anticoagulation 06/02/2020   Debility 06/01/2020   Lumbar spondylosis 10/04/2018   Chronic respiratory failure with hypoxia ([place]) 09/16/2018   Bilateral recurrent inguinal hernias 11/28/2017   Carotid artery stenosis 70/35/0093   Chronic systolic CHF (congestive heart failure) ([place]) 12/02/2015   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 ([place]) 10/16/2015   PVD (peripheral vascular disease) ([place])    Hereditary and idiopathic peripheral neuropathy 10/02/2015   Peripelvic (lymphatic) cyst    Pernicious anemia 07/15/2015   Bilateral renal cysts 07/12/2015   Lung nodule, 86mm RML CT 07/12/15 07/12/2015   Stage 3 chronic kidney disease 06/14/2015   Paroxysmal atrial fibrillation ([place]) 05/28/2015   AAA (abdominal aortic aneurysm) without rupture ([place]) 09/30/2014   Cardiomyopathy, ischemic 05/23/2014   DM (diabetes mellitus), type 2 with renal complications ([place]) 81/82/9937   Depression, major, single episode, complete remission ([place]) 03/18/2010   Hypothyroidism 02/03/2009   Osteoarthritis 05/27/2008   Hyperlipidemia associated with type 2  diabetes mellitus ([place]) 06/14/2007   Hypertension associated with diabetes ([place]) 06/14/2007   GERD 06/14/2007    Current Outpatient Medications:    acetaminophen (TYLENOL) 500 MG tablet, Take 2 tablets (1,000 mg total) by mouth every 8 (eight) hours as needed., Disp: 30 tablet, Rfl: 0   acetaminophen (TYLENOL) 500 MG tablet, Take 1,000 mg by mouth every 6 (six) hours as needed for mild pain., Disp: , Rfl:    amiodarone (PACERONE) 200 MG tablet, Take 100 mg by mouth daily., Disp: , Rfl:    apixaban (ELIQUIS) 2.5 MG TABS tablet, Take 2.5 mg by mouth 2 (two) times daily., Disp: , Rfl:    atorvastatin (LIPITOR) 80 MG tablet, Take 80 mg by mouth daily., Disp: , Rfl:    carvedilol (COREG) 12.5 MG tablet, Take 12.5 mg by mouth 2 (two) times daily with a meal., Disp: , Rfl:    cilostazol (PLETAL) 100 MG tablet, Take 1 tablet (100 mg total) by mouth 2 (two) times daily., Disp: 180 tablet, Rfl: 3   ezetimibe (ZETIA) 10 MG tablet, Take 10 mg by mouth daily., Disp: , Rfl:    FLUoxetine (PROZAC) 20 MG capsule, Take 20 mg by mouth daily., Disp: , Rfl:    furosemide (LASIX) 80 MG tablet, Take 80-120 mg by mouth See admin instructions. Take 1 and 1/2 tablets every morning then take 1 tablet in the afternoon, Disp: , Rfl:    gabapentin (NEURONTIN) 100 MG capsule, Take 100 mg by mouth 3 (three) times daily., Disp: , Rfl:    hydrALAZINE (APRESOLINE) 50 MG tablet, Take 50 mg by mouth 3 (three) times daily., Disp: , Rfl:    HYDROcodone-acetaminophen (NORCO) 5-325 MG tablet, Take 1 tablet by mouth every 6 (six) hours  as needed for moderate pain., Disp: 12 tablet, Rfl: 0   isosorbide mononitrate (IMDUR) 30 MG 24 hr tablet, Take 30 mg by mouth daily., Disp: , Rfl:    levothyroxine (SYNTHROID) 100 MCG tablet, Take 100 mcg by mouth daily., Disp: , Rfl:    Multiple Vitamins-Minerals (MULTIVITAMIN ADULTS 50+) TABS, Take 1 tablet by mouth daily., Disp: , Rfl:    pantoprazole (PROTONIX) 40 MG tablet, Take  1 tablet (40 mg total) by mouth 2 (two) times daily., Disp: , Rfl:    potassium chloride SA (KLOR-CON) 20 MEQ tablet, Take 20 mEq by mouth daily., Disp: , Rfl:    SANTYL ointment, Apply topically., Disp: , Rfl:    tiotropium (SPIRIVA) 18 MCG inhalation capsule, Place 18 mcg into inhaler and inhale daily as needed (for wheezing)., Disp: , Rfl:    glucose blood (ACCU-CHEK AVIVA PLUS) test strip, CHECK BLOOD SUGAR 4 TIMES A DAY., Disp: 100 each, Rfl: 11   nitroGLYCERIN (NITROSTAT) 0.4 MG SL tablet, DISSOLVE 1 TABLET UNDER TONGUE AS NEEDED FOR CHEST PAIN,MAY REPEAT IN5 MINUTES FOR 2 DOSES. (Patient not taking: Reported on 08/13/2020), Disp: 25 tablet, Rfl: 3 Allergies  Allergen Reactions   Metoprolol Shortness Of Breath, Palpitations and Other (See Comments)    Heart starts racing. Shallow breathing; pt states he also get skin irritation on his legs   Metformin Hives and Swelling    On legs   Metformin And Related Nausea And Vomiting      Social History   Socioeconomic History   Marital status: Married    Spouse name: Not on file   Number of children: Not on file   Years of education: Not on file   Highest education level: Not on file  Occupational History   Occupation: Retired  Tobacco Use   Smoking status: Never Smoker   Smokeless tobacco: Never Used   Tobacco comment: quit atleast 25 yrs ago, per pt  Vaping Use   Vaping Use: Never used  Substance and Sexual Activity   Alcohol use: Never    Comment: socially   Drug use: Never   Sexual activity: Not Currently  Other Topics Concern   Not on file  Social History Narrative   ** Merged History Encounter **       Pt lives in [place] with spouse. Retired from [employer].  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   Social Determinants of Health   Financial Resource Strain:    Difficulty of Paying Living Expenses: Not on file  Food Insecurity:    Worried  About Charity fundraiser in the Last Year: Not on file   [employer] of Food in the Last Year: Not on file  Transportation Needs:    Lack of Transportation (Medical): Not on file   Lack of Transportation (Non-Medical): Not on file  Physical Activity:    Days of Exercise per Week: Not on file   Minutes of Exercise per Session: Not on file  Stress:    Feeling of Stress : Not on file  Social Connections:    Frequency of Communication with Friends and Family: Not on file   Frequency of Social Gatherings with Friends and Family: Not on file   Attends Religious Services: Not on file   Active Member of Clubs or Organizations: Not on file   Attends Archivist Meetings: Not on file   Marital Status: Not on file  Intimate Partner Violence:    Fear  of Current or Ex-Partner: Not on file   Emotionally Abused: Not on file   Physically Abused: Not on file   Sexually Abused: Not on file    Physical Exam Cardiovascular:     Rate and Rhythm: Normal rate and regular rhythm.     Pulses: Normal pulses.  Pulmonary:     Effort: Pulmonary effort is normal.     Breath sounds: Normal breath sounds.  Musculoskeletal:        General: Normal range of motion.     Right lower leg: Edema present.     Left lower leg: Edema present.  Skin:    General: Skin is warm and dry.     Capillary Refill: Capillary refill takes less than 2 seconds.  Neurological:     Mental Status: He is alert and oriented to person, place, and time.  Psychiatric:        Mood and Affect: Mood normal.         Future Appointments  Date Time Provider [place]  08/21/2020 11:15 AM Evelina Bucy, DPM [facility]  09/08/2020 12:00 PM Larey Dresser, MD [facility] None  09/11/2020  1:00 PM [facility] - [facility]  09/11/2020  2:15 PM [facility]    BP 111/64 (BP Location: Left Arm, Patient Position: Sitting, Cuff Size: Normal)    Pulse 70    Resp 16    Wt 185 lb (83.9  kg)    SpO2 90%    BMI 28.98 kg/m   Weight yesterday- did not weigh  Last visit weight- 185 lb  Mr Fickle was seen at home today and rpeorted feeling well. He denied chest pain, SOB, headache, dizziness, orthopnea, fever or cough over the past week. He was seen earlier this week for an abdominal aortogram  And per wife was advised that no surgery was possible due to overwhelming disease. He maintained BLEE but his weight has been stable. He missed one morning and one afternoon of medications over the past week but has otherwise been compliant. His medications were verified and his pillbox was refilled I will follow up next week.   [name], EMT 08/13/20  ACTION: Home visit completed Next visit planned for 1 week

## 2020-08-14 ENCOUNTER — Telehealth: Payer: Self-pay | Admitting: Family Medicine

## 2020-08-14 DIAGNOSIS — L89620 Pressure ulcer of left heel, unstageable: Secondary | ICD-10-CM | POA: Diagnosis not present

## 2020-08-14 DIAGNOSIS — E1151 Type 2 diabetes mellitus with diabetic peripheral angiopathy without gangrene: Secondary | ICD-10-CM | POA: Diagnosis not present

## 2020-08-14 DIAGNOSIS — I5022 Chronic systolic (congestive) heart failure: Secondary | ICD-10-CM | POA: Diagnosis not present

## 2020-08-14 DIAGNOSIS — D631 Anemia in chronic kidney disease: Secondary | ICD-10-CM | POA: Diagnosis not present

## 2020-08-14 DIAGNOSIS — J9611 Chronic respiratory failure with hypoxia: Secondary | ICD-10-CM | POA: Diagnosis not present

## 2020-08-14 DIAGNOSIS — I130 Hypertensive heart and chronic kidney disease with heart failure and stage 1 through stage 4 chronic kidney disease, or unspecified chronic kidney disease: Secondary | ICD-10-CM | POA: Diagnosis not present

## 2020-08-14 DIAGNOSIS — E1122 Type 2 diabetes mellitus with diabetic chronic kidney disease: Secondary | ICD-10-CM | POA: Diagnosis not present

## 2020-08-14 DIAGNOSIS — N184 Chronic kidney disease, stage 4 (severe): Secondary | ICD-10-CM | POA: Diagnosis not present

## 2020-08-14 DIAGNOSIS — J441 Chronic obstructive pulmonary disease with (acute) exacerbation: Secondary | ICD-10-CM | POA: Diagnosis not present

## 2020-08-14 NOTE — Telephone Encounter (Signed)
Please advise 

## 2020-08-14 NOTE — Telephone Encounter (Signed)
Radovan the occupational therapaist from [facility] is requesting verbal order to continue patients occupational therapy 1 time weekly for the next 5 weeks including this week.  Ok to leave message [phone]

## 2020-08-16 DIAGNOSIS — J9602 Acute respiratory failure with hypercapnia: Secondary | ICD-10-CM | POA: Diagnosis not present

## 2020-08-16 DIAGNOSIS — I5022 Chronic systolic (congestive) heart failure: Secondary | ICD-10-CM | POA: Diagnosis not present

## 2020-08-16 DIAGNOSIS — J9601 Acute respiratory failure with hypoxia: Secondary | ICD-10-CM | POA: Diagnosis not present

## 2020-08-16 DIAGNOSIS — J449 Chronic obstructive pulmonary disease, unspecified: Secondary | ICD-10-CM | POA: Diagnosis not present

## 2020-08-17 NOTE — Telephone Encounter (Signed)
Verbal order given. therapy 1 time weekly for the next 5 weeks including this week.

## 2020-08-17 NOTE — Telephone Encounter (Signed)
Ok with me. Please place any necessary orders. 

## 2020-08-18 ENCOUNTER — Ambulatory Visit: Payer: Medicare PPO | Admitting: Podiatry

## 2020-08-19 DIAGNOSIS — L89620 Pressure ulcer of left heel, unstageable: Secondary | ICD-10-CM | POA: Diagnosis not present

## 2020-08-20 ENCOUNTER — Other Ambulatory Visit (HOSPITAL_COMMUNITY): Payer: Self-pay

## 2020-08-20 ENCOUNTER — Other Ambulatory Visit: Payer: Self-pay | Admitting: Family Medicine

## 2020-08-20 ENCOUNTER — Telehealth (HOSPITAL_COMMUNITY): Payer: Self-pay | Admitting: *Deleted

## 2020-08-20 DIAGNOSIS — I5022 Chronic systolic (congestive) heart failure: Secondary | ICD-10-CM

## 2020-08-20 DIAGNOSIS — J441 Chronic obstructive pulmonary disease with (acute) exacerbation: Secondary | ICD-10-CM | POA: Diagnosis not present

## 2020-08-20 DIAGNOSIS — I130 Hypertensive heart and chronic kidney disease with heart failure and stage 1 through stage 4 chronic kidney disease, or unspecified chronic kidney disease: Secondary | ICD-10-CM | POA: Diagnosis not present

## 2020-08-20 DIAGNOSIS — L89620 Pressure ulcer of left heel, unstageable: Secondary | ICD-10-CM | POA: Diagnosis not present

## 2020-08-20 DIAGNOSIS — D631 Anemia in chronic kidney disease: Secondary | ICD-10-CM | POA: Diagnosis not present

## 2020-08-20 DIAGNOSIS — E1151 Type 2 diabetes mellitus with diabetic peripheral angiopathy without gangrene: Secondary | ICD-10-CM | POA: Diagnosis not present

## 2020-08-20 DIAGNOSIS — N184 Chronic kidney disease, stage 4 (severe): Secondary | ICD-10-CM | POA: Diagnosis not present

## 2020-08-20 DIAGNOSIS — E1122 Type 2 diabetes mellitus with diabetic chronic kidney disease: Secondary | ICD-10-CM | POA: Diagnosis not present

## 2020-08-20 DIAGNOSIS — J9611 Chronic respiratory failure with hypoxia: Secondary | ICD-10-CM | POA: Diagnosis not present

## 2020-08-20 MED ORDER — FUROSEMIDE 80 MG PO TABS: 120.0000 mg | ORAL_TABLET | Freq: Two times a day (BID) | ORAL | 6 refills | Status: DC

## 2020-08-20 MED ORDER — METOLAZONE 2.5 MG PO TABS
2.5000 mg | ORAL_TABLET | ORAL | 0 refills | Status: DC
Start: 2020-08-20 — End: 2021-04-19

## 2020-08-20 NOTE — Progress Notes (Signed)
Paramedicine Encounter    Patient ID: Andrew [name], male    DOB: 14-Apr-1928, 86 y.o.   MRN: [id number]   Patient Care Team: [name], MD as PCP - General (Family Medicine) Larey Dresser, MD as PCP - Advanced Heart Failure (Cardiology) Larey Dresser, MD as Consulting Physician (Cardiology) Jorge Ny, LCSW as Social Worker (Licensed Clinical Social Worker) Gardiner Barefoot, DPM as Consulting Physician (Podiatry) [name], MD (Family Medicine) Patient, No Pcp Per (General Practice)  Patient Active Problem List   Diagnosis Date Noted  . Acute respiratory failure with hypercapnia ([place]) 06/02/2020  . Acute metabolic encephalopathy 21/19/4174  . CKD (chronic kidney disease), stage IV ([place]) 06/02/2020  . PAF (paroxysmal atrial fibrillation) ([place]) 06/02/2020  . Hypothyroidism 06/02/2020  . Chronic anticoagulation 06/02/2020  . Debility 06/01/2020  . Lumbar spondylosis 10/04/2018  . Chronic respiratory failure with hypoxia ([place]) 09/16/2018  . Bilateral recurrent inguinal hernias 11/28/2017  . Carotid artery stenosis 01/12/2016  . Chronic systolic CHF (congestive heart failure) ([place]) 12/02/2015  . 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 ([place]) 10/16/2015  . PVD (peripheral vascular disease) ([place])   . Hereditary and idiopathic peripheral neuropathy 10/02/2015  . Peripelvic (lymphatic) cyst   . Pernicious anemia 07/15/2015  . Bilateral renal cysts 07/12/2015  . Lung nodule, 37mm RML CT 07/12/15 07/12/2015  . Stage 3 chronic kidney disease 06/14/2015  . Paroxysmal atrial fibrillation ([place]) 05/28/2015  . AAA (abdominal aortic aneurysm) without rupture ([place]) 09/30/2014  . Cardiomyopathy, ischemic 05/23/2014  . DM (diabetes mellitus), type 2 with renal complications ([place]) 08/08/4817  . Depression, major, single episode, complete remission ([place]) 03/18/2010  . Hypothyroidism 02/03/2009  . Osteoarthritis 05/27/2008  . Hyperlipidemia associated with type 2  diabetes mellitus ([place]) 06/14/2007  . Hypertension associated with diabetes ([place]) 06/14/2007  . GERD 06/14/2007    Current Outpatient Medications:  .  acetaminophen (TYLENOL) 500 MG tablet, Take 1,000 mg by mouth every 6 (six) hours as needed for mild pain., Disp: , Rfl:  .  amiodarone (PACERONE) 200 MG tablet, Take 100 mg by mouth daily., Disp: , Rfl:  .  apixaban (ELIQUIS) 2.5 MG TABS tablet, Take 2.5 mg by mouth 2 (two) times daily., Disp: , Rfl:  .  atorvastatin (LIPITOR) 80 MG tablet, Take 80 mg by mouth daily., Disp: , Rfl:  .  carvedilol (COREG) 12.5 MG tablet, Take 12.5 mg by mouth 2 (two) times daily with a meal., Disp: , Rfl:  .  cilostazol (PLETAL) 100 MG tablet, Take 1 tablet (100 mg total) by mouth 2 (two) times daily., Disp: 180 tablet, Rfl: 3 .  ezetimibe (ZETIA) 10 MG tablet, Take 10 mg by mouth daily., Disp: , Rfl:  .  FLUoxetine (PROZAC) 20 MG capsule, Take 20 mg by mouth daily., Disp: , Rfl:  .  furosemide (LASIX) 80 MG tablet, Take 80-120 mg by mouth See admin instructions. Take 1 and 1/2 tablets every morning then take 1 tablet in the afternoon, Disp: , Rfl:  .  gabapentin (NEURONTIN) 100 MG capsule, Take 100 mg by mouth 3 (three) times daily., Disp: , Rfl:  .  hydrALAZINE (APRESOLINE) 50 MG tablet, Take 50 mg by mouth 3 (three) times daily., Disp: , Rfl:  .  isosorbide mononitrate (IMDUR) 30 MG 24 hr tablet, Take 30 mg by mouth daily., Disp: , Rfl:  .  levothyroxine (SYNTHROID) 100 MCG tablet, Take 100 mcg by mouth daily., Disp: , Rfl:  .  Multiple Vitamins-Minerals (MULTIVITAMIN  ADULTS 50+) TABS, Take 1 tablet by mouth daily., Disp: , Rfl:  .  pantoprazole (PROTONIX) 40 MG tablet, Take 1 tablet (40 mg total) by mouth 2 (two) times daily., Disp: , Rfl:  .  potassium chloride SA (KLOR-CON) 20 MEQ tablet, Take 20 mEq by mouth daily., Disp: , Rfl:  .  SANTYL ointment, Apply topically., Disp: , Rfl:  .  tiotropium (SPIRIVA) 18 MCG inhalation capsule, Place 18 mcg into inhaler  and inhale daily as needed (for wheezing)., Disp: , Rfl:  .  acetaminophen (TYLENOL) 500 MG tablet, Take 2 tablets (1,000 mg total) by mouth every 8 (eight) hours as needed., Disp: 30 tablet, Rfl: 0 .  glucose blood (ACCU-CHEK AVIVA PLUS) test strip, CHECK BLOOD SUGAR 4 TIMES A DAY., Disp: 100 each, Rfl: 11 .  HYDROcodone-acetaminophen (NORCO) 5-325 MG tablet, Take 1 tablet by mouth every 6 (six) hours as needed for moderate pain., Disp: 12 tablet, Rfl: 0 .  nitroGLYCERIN (NITROSTAT) 0.4 MG SL tablet, DISSOLVE 1 TABLET UNDER TONGUE AS NEEDED FOR CHEST PAIN,MAY REPEAT IN5 MINUTES FOR 2 DOSES. (Patient not taking: Reported on 08/13/2020), Disp: 25 tablet, Rfl: 3 Allergies  Allergen Reactions  . Metoprolol Shortness Of Breath, Palpitations and Other (See Comments)    Heart starts racing. Shallow breathing; pt states he also get skin irritation on his legs  . Metformin Hives and Swelling    On legs  . Metformin And Related Nausea And Vomiting      Social History   Socioeconomic History  . Marital status: Married    Spouse name: Not on file  . Number of children: Not on file  . Years of education: Not on file  . Highest education level: Not on file  Occupational History  . Occupation: Retired  Tobacco Use  . Smoking status: Never Smoker  . Smokeless tobacco: Never Used  . Tobacco comment: quit atleast 25 yrs ago, per pt  Vaping Use  . Vaping Use: Never used  Substance and Sexual Activity  . Alcohol use: Never    Comment: socially  . Drug use: Never  . Sexual activity: Not Currently  Other Topics Concern  . Not on file  Social History Narrative   ** Merged History Encounter **       Pt lives in [place] with spouse. Retired from [employer].  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   Social Determinants of Health   Financial Resource Strain:   . Difficulty of Paying Living Expenses: Not on file  Food Insecurity:   . Worried  About Charity fundraiser in the Last Year: Not on file  . Ran Out of Food in the Last Year: Not on file  Transportation Needs:   . Lack of Transportation (Medical): Not on file  . Lack of Transportation (Non-Medical): Not on file  Physical Activity:   . Days of Exercise per Week: Not on file  . Minutes of Exercise per Session: Not on file  Stress:   . Feeling of Stress : Not on file  Social Connections:   . Frequency of Communication with Friends and Family: Not on file  . Frequency of Social Gatherings with Friends and Family: Not on file  . Attends Religious Services: Not on file  . Active Member of Clubs or Organizations: Not on file  . Attends Archivist Meetings: Not on file  . Marital Status: Not on file  Intimate Partner Violence:   . Fear  of Current or Ex-Partner: Not on file  . Emotionally Abused: Not on file  . Physically Abused: Not on file  . Sexually Abused: Not on file    Physical Exam Cardiovascular:     Rate and Rhythm: Normal rate and regular rhythm.     Pulses: Normal pulses.  Pulmonary:     Effort: Pulmonary effort is normal.     Breath sounds: Examination of the right-lower field reveals rhonchi. Examination of the left-lower field reveals rhonchi. Rhonchi present.  Musculoskeletal:        General: Normal range of motion.     Right lower leg: Edema present.     Left lower leg: Edema present.  Skin:    General: Skin is warm and dry.     Capillary Refill: Capillary refill takes less than 2 seconds.  Neurological:     Mental Status: He is alert and oriented to person, place, and time.         Future Appointments  Date Time Provider [place]  08/21/2020 11:15 AM Evelina Bucy, DPM [facility]  09/08/2020 12:00 PM Larey Dresser, MD [facility] None  09/11/2020  1:00 PM [facility] VASC 4 - SS [facility]  09/11/2020  2:15 PM [facility]    BP (!) 114/57 (BP Location: Left Arm, Patient Position: Sitting, Cuff  Size: Normal)   Pulse 75   Resp 16   Wt 192 lb (87.1 kg)   SpO2 95%   BMI 30.07 kg/m   Weight yesterday- did not weigh  Last visit weight- 185 lb  Mr Voong was seen at home today and reported feeling generally well but had a couple of complaints. He complained of mild SOB over the past few days and had bilateral rhonchi in the bases of his lungs. Also noted was increased BLEE with weeping. He has been compliant with his medications despite missing on evening dose last week however none of these medications were for fluid management. Also his weight was elevated 7 lbs since our visit last week. I contacted the HF clinic was advised by Dr [name] to take metolazone tomorrow and an extra dose of potassium and increase lasix to 120 mg BID. Medication changes were reflected in his pillbox and I will follow up next week.   [name], EMT 08/20/20  ACTION: Home visit completed Next visit planned for 1 week

## 2020-08-20 NOTE — Telephone Encounter (Signed)
Andrew [name] called to report pt has gained 7 lbs since last week, he has increased LE edema and legs are starting to weep, he also reports slight increased of SOB, VS stable. Per Dr [name] pt should take Metolazone 2.5 mg x 1 dose only, increase Lasix to 120 mg Twice daily and take an extra 20 meq of KCL x 1 dose, labs early next week. Edwyna Ready is aware and will advise pt, med sent in, lab sch for 8/31

## 2020-08-20 NOTE — Telephone Encounter (Signed)
Pt requesting refills.

## 2020-08-21 ENCOUNTER — Ambulatory Visit (INDEPENDENT_AMBULATORY_CARE_PROVIDER_SITE_OTHER): Payer: Medicare PPO | Admitting: Podiatry

## 2020-08-21 ENCOUNTER — Other Ambulatory Visit: Payer: Self-pay

## 2020-08-21 DIAGNOSIS — N184 Chronic kidney disease, stage 4 (severe): Secondary | ICD-10-CM | POA: Diagnosis not present

## 2020-08-21 DIAGNOSIS — I130 Hypertensive heart and chronic kidney disease with heart failure and stage 1 through stage 4 chronic kidney disease, or unspecified chronic kidney disease: Secondary | ICD-10-CM | POA: Diagnosis not present

## 2020-08-21 DIAGNOSIS — L89623 Pressure ulcer of left heel, stage 3: Secondary | ICD-10-CM

## 2020-08-21 DIAGNOSIS — E1151 Type 2 diabetes mellitus with diabetic peripheral angiopathy without gangrene: Secondary | ICD-10-CM | POA: Diagnosis not present

## 2020-08-21 DIAGNOSIS — L89620 Pressure ulcer of left heel, unstageable: Secondary | ICD-10-CM | POA: Diagnosis not present

## 2020-08-21 DIAGNOSIS — I739 Peripheral vascular disease, unspecified: Secondary | ICD-10-CM | POA: Diagnosis not present

## 2020-08-21 DIAGNOSIS — I5022 Chronic systolic (congestive) heart failure: Secondary | ICD-10-CM | POA: Diagnosis not present

## 2020-08-21 DIAGNOSIS — E1122 Type 2 diabetes mellitus with diabetic chronic kidney disease: Secondary | ICD-10-CM | POA: Diagnosis not present

## 2020-08-21 DIAGNOSIS — D631 Anemia in chronic kidney disease: Secondary | ICD-10-CM | POA: Diagnosis not present

## 2020-08-21 DIAGNOSIS — I872 Venous insufficiency (chronic) (peripheral): Secondary | ICD-10-CM | POA: Diagnosis not present

## 2020-08-21 DIAGNOSIS — J441 Chronic obstructive pulmonary disease with (acute) exacerbation: Secondary | ICD-10-CM | POA: Diagnosis not present

## 2020-08-21 DIAGNOSIS — J9611 Chronic respiratory failure with hypoxia: Secondary | ICD-10-CM | POA: Diagnosis not present

## 2020-08-23 NOTE — Progress Notes (Signed)
  Subjective:  Patient ID: Andrew [name], male    DOB: 02/01/1928,  MRN: [id number]  Chief Complaint  Patient presents with  . Foot Ulcer    Ulcer check left heel.  . Leg Problem    Right lower extremity blisters.    86 y.o. male presents for wound care.  Thinks that the wound is improving with santyl.  States that he underwent angiogram however no vascular intervention was not able to be performed Objective:  Physical Exam: Wound Location: Left heel Wound Measurement: 3x4 Wound Base: Granular Peri-wound: Normal Exudate: Scant/small amount Serosanguinous exudate wound without warmth, erythema, signs of acute infection  Foot warm to touch  Review of Vascular procedure 08/10/20 - Dr. Donzetta Matters Common iliac artery on the left appeared to have calcified disease but with pullback gradient there was no change.  Left common external leg arteries were heavily tortuous I could not get a catheter down but could get a wire down the common femoral artery which also appeared diseased.  Left lower extremity limited angiography demonstrates the SFA is heavily calcified throughout likely with multiple greater than 50% stenosis but never frankly occludes.  Assessment:   1. Decubitus ulcer of left heel, stage 3 (HCC)   2. Venous insufficiency of right leg   3. Peripheral vascular disease ([place])    Plan:  Patient was evaluated and treated and all questions answered.  Ulcer left heel -Wound does appear improved  -No debridement performed today -Continue Santyl.  Dressed with Santyl wet-to-dry.  Educated on performing dressing change  Venous insufficiency -Blisters gently lanced with sterile 312 blade -Multilayer compression dressing applied   Procedure: Multilayer Compression dressing Rationale: venous insufficiency Technique: Unna boot, cast padding, Coban compression dressing applied Disposition: Patient tolerated procedure well.   No follow-ups on file.

## 2020-08-25 ENCOUNTER — Other Ambulatory Visit: Payer: Self-pay

## 2020-08-25 ENCOUNTER — Ambulatory Visit (HOSPITAL_COMMUNITY)
Admission: RE | Admit: 2020-08-25 | Discharge: 2020-08-25 | Disposition: A | Discharge status: Home / Self Care | Payer: Medicare PPO | Source: Ambulatory Visit | Attending: Internal Medicine | Admitting: Internal Medicine

## 2020-08-25 DIAGNOSIS — L89620 Pressure ulcer of left heel, unstageable: Secondary | ICD-10-CM | POA: Diagnosis not present

## 2020-08-25 DIAGNOSIS — I5022 Chronic systolic (congestive) heart failure: Secondary | ICD-10-CM | POA: Diagnosis not present

## 2020-08-25 DIAGNOSIS — J441 Chronic obstructive pulmonary disease with (acute) exacerbation: Secondary | ICD-10-CM | POA: Diagnosis not present

## 2020-08-25 DIAGNOSIS — D631 Anemia in chronic kidney disease: Secondary | ICD-10-CM | POA: Diagnosis not present

## 2020-08-25 DIAGNOSIS — N184 Chronic kidney disease, stage 4 (severe): Secondary | ICD-10-CM | POA: Diagnosis not present

## 2020-08-25 DIAGNOSIS — I130 Hypertensive heart and chronic kidney disease with heart failure and stage 1 through stage 4 chronic kidney disease, or unspecified chronic kidney disease: Secondary | ICD-10-CM | POA: Diagnosis not present

## 2020-08-25 DIAGNOSIS — J9611 Chronic respiratory failure with hypoxia: Secondary | ICD-10-CM | POA: Diagnosis not present

## 2020-08-25 DIAGNOSIS — E1151 Type 2 diabetes mellitus with diabetic peripheral angiopathy without gangrene: Secondary | ICD-10-CM | POA: Diagnosis not present

## 2020-08-25 DIAGNOSIS — E1122 Type 2 diabetes mellitus with diabetic chronic kidney disease: Secondary | ICD-10-CM | POA: Diagnosis not present

## 2020-08-25 LAB — BASIC METABOLIC PANEL
Anion gap: 12 (ref 5–15)
BUN: 30 mg/dL — ABNORMAL HIGH (ref 8–23)
CO2: 29 mmol/L (ref 22–32)
Calcium: 9.3 mg/dL (ref 8.9–10.3)
Chloride: 101 mmol/L (ref 98–111)
Creatinine, Ser: 1.76 mg/dL — ABNORMAL HIGH (ref 0.61–1.24)
GFR calc Af Amer: 39 mL/min — ABNORMAL LOW (ref >60)
GFR calc non Af Amer: 34 mL/min — ABNORMAL LOW (ref >60)
Glucose, Bld: 197 mg/dL — ABNORMAL HIGH (ref 70–99)
Potassium: 3.9 mmol/L (ref 3.5–5.1)
Sodium: 142 mmol/L (ref 135–145)

## 2020-08-26 ENCOUNTER — Telehealth (HOSPITAL_COMMUNITY): Payer: Self-pay

## 2020-08-26 DIAGNOSIS — I5022 Chronic systolic (congestive) heart failure: Secondary | ICD-10-CM

## 2020-08-26 DIAGNOSIS — K625 Hemorrhage of anus and rectum: Secondary | ICD-10-CM

## 2020-08-26 NOTE — Telephone Encounter (Signed)
-----   Message from Larey Dresser, MD sent at 08/25/2020  4:53 PM EDT ----- yes

## 2020-08-26 NOTE — Telephone Encounter (Signed)
Patients wife advised and verbalized understanding.lab appt scheduled,lab order entered  Orders Placed This Encounter  Procedures  . CBC    Standing Status:   Future    Standing Expiration Date:   08/26/2021    Order Specific Question:   Release to patient    Answer:   Immediate

## 2020-08-27 ENCOUNTER — Telehealth: Payer: Self-pay

## 2020-08-27 ENCOUNTER — Other Ambulatory Visit: Payer: Self-pay

## 2020-08-27 ENCOUNTER — Telehealth: Payer: Self-pay | Admitting: Family Medicine

## 2020-08-27 ENCOUNTER — Other Ambulatory Visit (HOSPITAL_COMMUNITY): Payer: Self-pay

## 2020-08-27 ENCOUNTER — Ambulatory Visit (HOSPITAL_COMMUNITY)
Admission: RE | Admit: 2020-08-27 | Discharge: 2020-08-27 | Disposition: A | Discharge status: Home / Self Care | Payer: Medicare PPO | Source: Ambulatory Visit | Attending: Internal Medicine | Admitting: Internal Medicine

## 2020-08-27 DIAGNOSIS — K625 Hemorrhage of anus and rectum: Secondary | ICD-10-CM | POA: Diagnosis not present

## 2020-08-27 DIAGNOSIS — L89620 Pressure ulcer of left heel, unstageable: Secondary | ICD-10-CM | POA: Diagnosis not present

## 2020-08-27 NOTE — Telephone Encounter (Signed)
Please advise 

## 2020-08-27 NOTE — Progress Notes (Signed)
Paramedicine Encounter    Patient ID: Andrew [name], male    DOB: 1928-02-14, 86 y.o.   MRN: [id number]   Patient Care Team: [name], MD as PCP - General (Family Medicine) Larey Dresser, MD as PCP - Advanced Heart Failure (Cardiology) Larey Dresser, MD as Consulting Physician (Cardiology) Jorge Ny, LCSW as Social Worker (Licensed Clinical Social Worker) Gardiner Barefoot, DPM as Consulting Physician (Podiatry) [name], MD (Family Medicine) Patient, No Pcp Per (General Practice)  Patient Active Problem List   Diagnosis Date Noted  . Acute respiratory failure with hypercapnia ([place]) 06/02/2020  . Acute metabolic encephalopathy 71/05/2693  . CKD (chronic kidney disease), stage IV ([place]) 06/02/2020  . PAF (paroxysmal atrial fibrillation) ([place]) 06/02/2020  . Hypothyroidism 06/02/2020  . Chronic anticoagulation 06/02/2020  . Debility 06/01/2020  . Lumbar spondylosis 10/04/2018  . Chronic respiratory failure with hypoxia ([place]) 09/16/2018  . Bilateral recurrent inguinal hernias 11/28/2017  . Carotid artery stenosis 01/12/2016  . Chronic systolic CHF (congestive heart failure) ([place]) 12/02/2015  . 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 PVD (peripheral vascular disease) ([place])   . Hereditary and idiopathic peripheral neuropathy 10/02/2015  . Peripelvic (lymphatic) cyst   . Pernicious anemia 07/15/2015  . Bilateral renal cysts 07/12/2015  . Lung nodule, 7mm RML CT 07/12/15 07/12/2015  . Stage 3 chronic kidney disease 06/14/2015  . Paroxysmal atrial fibrillation ([place]) 05/28/2015  . AAA (abdominal aortic aneurysm) without rupture ([place]) 09/30/2014  . Cardiomyopathy, ischemic 05/23/2014  . DM (diabetes mellitus), type 2 with renal complications ([place]) 85/46/2703  . Depression, major, single episode, complete remission ([place]) 03/18/2010  . Hypothyroidism 02/03/2009  . Osteoarthritis 05/27/2008  . Hyperlipidemia associated with type 2  diabetes mellitus ([place]) 06/14/2007  . Hypertension associated with diabetes ([place]) 06/14/2007  . GERD 06/14/2007    Current Outpatient Medications:  .  acetaminophen (TYLENOL) 500 MG tablet, Take 2 tablets (1,000 mg total) by mouth every 8 (eight) hours as needed., Disp: 30 tablet, Rfl: 0 .  acetaminophen (TYLENOL) 500 MG tablet, Take 1,000 mg by mouth every 6 (six) hours as needed for mild pain., Disp: , Rfl:  .  amiodarone (PACERONE) 200 MG tablet, Take 100 mg by mouth daily., Disp: , Rfl:  .  apixaban (ELIQUIS) 2.5 MG TABS tablet, Take 2.5 mg by mouth 2 (two) times daily., Disp: , Rfl:  .  atorvastatin (LIPITOR) 80 MG tablet, Take 80 mg by mouth daily., Disp: , Rfl:  .  carvedilol (COREG) 12.5 MG tablet, Take 12.5 mg by mouth 2 (two) times daily with a meal., Disp: , Rfl:  .  cilostazol (PLETAL) 100 MG tablet, Take 1 tablet (100 mg total) by mouth 2 (two) times daily., Disp: 180 tablet, Rfl: 3 .  ezetimibe (ZETIA) 10 MG tablet, TAKE (1) TABLET DAILY., Disp: 90 tablet, Rfl: 0 .  FLUoxetine (PROZAC) 20 MG capsule, TAKE (1) CAPSULE DAILY., Disp: 90 capsule, Rfl: 0 .  furosemide (LASIX) 80 MG tablet, Take 1.5 tablets (120 mg total) by mouth 2 (two) times daily., Disp: 90 tablet, Rfl: 6 .  gabapentin (NEURONTIN) 100 MG capsule, Take 100 mg by mouth 3 (three) times daily., Disp: , Rfl:  .  glucose blood (ACCU-CHEK AVIVA PLUS) test strip, CHECK BLOOD SUGAR 4 TIMES A DAY., Disp: 100 each, Rfl: 11 .  hydrALAZINE (APRESOLINE) 50 MG tablet, Take 50 mg by mouth 3 (three) times daily., Disp: , Rfl:  .  HYDROcodone-acetaminophen (NORCO) 5-325 MG  tablet, Take 1 tablet by mouth every 6 (six) hours as needed for moderate pain., Disp: 12 tablet, Rfl: 0 .  isosorbide mononitrate (IMDUR) 30 MG 24 hr tablet, Take 30 mg by mouth daily., Disp: , Rfl:  .  levothyroxine (SYNTHROID) 100 MCG tablet, Take 100 mcg by mouth daily., Disp: , Rfl:  .  metolazone (ZAROXOLYN) 2.5 MG tablet, Take 1 tablet (2.5 mg total) by  mouth as directed. By HF Clinic, Disp: 3 tablet, Rfl: 0 .  Multiple Vitamins-Minerals (MULTIVITAMIN ADULTS 50+) TABS, Take 1 tablet by mouth daily., Disp: , Rfl:  .  nitroGLYCERIN (NITROSTAT) 0.4 MG SL tablet, DISSOLVE 1 TABLET UNDER TONGUE AS NEEDED FOR CHEST PAIN,MAY REPEAT IN5 MINUTES FOR 2 DOSES., Disp: 25 tablet, Rfl: 3 .  pantoprazole (PROTONIX) 40 MG tablet, Take 1 tablet (40 mg total) by mouth 2 (two) times daily., Disp: , Rfl:  .  potassium chloride SA (KLOR-CON) 20 MEQ tablet, Take 20 mEq by mouth daily., Disp: , Rfl:  .  SANTYL ointment, Apply topically., Disp: , Rfl:  .  tiotropium (SPIRIVA) 18 MCG inhalation capsule, Place 18 mcg into inhaler and inhale daily as needed (for wheezing)., Disp: , Rfl:  Allergies  Allergen Reactions  . Metoprolol Shortness Of Breath, Palpitations and Other (See Comments)    Heart starts racing. Shallow breathing; pt states he also get skin irritation on his legs  . Metformin Hives and Swelling    On legs  . Metformin And Related Nausea And Vomiting      Social History   Socioeconomic History  . Marital status: Married    Spouse name: Not on file  . Number of children: Not on file  . Years of education: Not on file  . Highest education level: Not on file  Occupational History  . Occupation: Retired  Tobacco Use  . Smoking status: Never Smoker  . Smokeless tobacco: Never Used  . Tobacco comment: quit atleast 25 yrs ago, per pt  Vaping Use  . Vaping Use: Never used  Substance and Sexual Activity  . Alcohol use: Never    Comment: socially  . Drug use: Never  . Sexual activity: Not Currently  Other Topics Concern  . Not on file  Social History Narrative   ** Merged History Encounter **       Pt lives in [place] with spouse. Retired from [employer].  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   Social Determinants of Health   Financial Resource Strain:   . Difficulty of Paying Living  Expenses: Not on file  Food Insecurity:   . Worried About Charity fundraiser in the Last Year: Not on file  . Ran Out of Food in the Last Year: Not on file  Transportation Needs:   . Lack of Transportation (Medical): Not on file  . Lack of Transportation (Non-Medical): Not on file  Physical Activity:   . Days of Exercise per Week: Not on file  . Minutes of Exercise per Session: Not on file  Stress:   . Feeling of Stress : Not on file  Social Connections:   . Frequency of Communication with Friends and Family: Not on file  . Frequency of Social Gatherings with Friends and Family: Not on file  . Attends Religious Services: Not on file  . Active Member of Clubs or Organizations: Not on file  . Attends Archivist Meetings: Not on file  . Marital Status: Not on file  Intimate Partner Violence:   . Fear of Current or Ex-Partner: Not on file  . Emotionally Abused: Not on file  . Physically Abused: Not on file  . Sexually Abused: Not on file    Physical Exam Cardiovascular:     Rate and Rhythm: Normal rate and regular rhythm.     Pulses: Normal pulses.  Pulmonary:     Effort: Pulmonary effort is normal.     Breath sounds: Normal breath sounds.  Musculoskeletal:        General: Normal range of motion.     Right lower leg: Edema present.     Left lower leg: Edema present.  Skin:    Capillary Refill: Capillary refill takes less than 2 seconds.  Neurological:     Mental Status: He is alert and oriented to person, place, and time.  Psychiatric:        Mood and Affect: Mood normal.         Future Appointments  Date Time Provider [place]  09/04/2020  2:30 PM Evelina Bucy, DPM [facility]  09/08/2020 12:00 PM Larey Dresser, MD [facility] None  09/11/2020  1:00 PM [facility] VASC 4 - SS [facility]  09/11/2020  2:15 PM [facility]    BP (!) 113/56 (BP Location: Left Arm, Patient Position: Sitting, Cuff Size: Normal)   Pulse 80   Resp  18   Wt 187 lb 6.4 oz (85 kg)   SpO2 94%   BMI 29.35 kg/m   Weight yesterday- did not weigh  Last visit weight- 192 lb  Mr Goeller was seen at home today and reported feeling generally well. He denied chest pain, SOB, headache, dizziness, orthopnea, fever or cough over the past week. He has been compliant with his medications and his weight is trending down. The weeping in his legs has increased however it is under the care of Dr March Rummage so I advised them to contact his office to see if antibiotics are necessary. Ms Liddy was understanding and said they have an appointment with Dr March Rummage next week but she would reach out if his legs began to redden or the weeping got much worse. His medications were verified and his pillbox was refilled. I will follow up next week.   [name], EMT 08/27/20  ACTION: Home visit completed Next visit planned for 1 week

## 2020-08-27 NOTE — Telephone Encounter (Signed)
FYI

## 2020-08-27 NOTE — Telephone Encounter (Signed)
Verbal orders [facility]  Physical Therapy REQUEST TO:  Put pt on hold -Pt has not set up any of his 6 visits, as well as refused some of the appointments  [phone]  Ok to leave voicemaol

## 2020-08-27 NOTE — Telephone Encounter (Signed)
Andrew [name] is calling from [place] - patient has missed OT appointment this week, and is unable to fit him back in this week.

## 2020-08-28 ENCOUNTER — Other Ambulatory Visit: Payer: Self-pay

## 2020-08-28 DIAGNOSIS — I739 Peripheral vascular disease, unspecified: Secondary | ICD-10-CM

## 2020-08-28 NOTE — Telephone Encounter (Signed)
Left voice message at [phone] VO put pt on old since refused some of the appointments

## 2020-08-28 NOTE — Progress Notes (Signed)
Ive faxed over all the information.

## 2020-08-28 NOTE — Telephone Encounter (Signed)
Ok with me. Please place any necessary orders. 

## 2020-09-02 DIAGNOSIS — J441 Chronic obstructive pulmonary disease with (acute) exacerbation: Secondary | ICD-10-CM | POA: Diagnosis not present

## 2020-09-02 DIAGNOSIS — L89620 Pressure ulcer of left heel, unstageable: Secondary | ICD-10-CM | POA: Diagnosis not present

## 2020-09-02 DIAGNOSIS — D631 Anemia in chronic kidney disease: Secondary | ICD-10-CM | POA: Diagnosis not present

## 2020-09-02 DIAGNOSIS — E1122 Type 2 diabetes mellitus with diabetic chronic kidney disease: Secondary | ICD-10-CM | POA: Diagnosis not present

## 2020-09-02 DIAGNOSIS — I5022 Chronic systolic (congestive) heart failure: Secondary | ICD-10-CM | POA: Diagnosis not present

## 2020-09-02 DIAGNOSIS — I130 Hypertensive heart and chronic kidney disease with heart failure and stage 1 through stage 4 chronic kidney disease, or unspecified chronic kidney disease: Secondary | ICD-10-CM | POA: Diagnosis not present

## 2020-09-02 DIAGNOSIS — J9611 Chronic respiratory failure with hypoxia: Secondary | ICD-10-CM | POA: Diagnosis not present

## 2020-09-02 DIAGNOSIS — N184 Chronic kidney disease, stage 4 (severe): Secondary | ICD-10-CM | POA: Diagnosis not present

## 2020-09-02 DIAGNOSIS — E1151 Type 2 diabetes mellitus with diabetic peripheral angiopathy without gangrene: Secondary | ICD-10-CM | POA: Diagnosis not present

## 2020-09-03 ENCOUNTER — Other Ambulatory Visit (HOSPITAL_COMMUNITY): Payer: Self-pay

## 2020-09-03 NOTE — Progress Notes (Signed)
Paramedicine Encounter    Patient ID: Andrew [name], male    DOB: 01-03-1928, 86 y.o.   MRN: [id number]   Patient Care Team: [name], MD as PCP - General (Family Medicine) Larey Dresser, MD as PCP - Advanced Heart Failure (Cardiology) Larey Dresser, MD as Consulting Physician (Cardiology) Jorge Ny, LCSW as Social Worker (Licensed Clinical Social Worker) Gardiner Barefoot, DPM as Consulting Physician (Podiatry) [name], MD (Family Medicine) Patient, No Pcp Per (General Practice)  Patient Active Problem List   Diagnosis Date Noted  . Acute respiratory failure with hypercapnia ([place]) 06/02/2020  . Acute metabolic encephalopathy 66/44/0347  . CKD (chronic kidney disease), stage IV ([place]) 06/02/2020  . PAF (paroxysmal atrial fibrillation) ([place]) 06/02/2020  . Hypothyroidism 06/02/2020  . Chronic anticoagulation 06/02/2020  . Debility 06/01/2020  . Lumbar spondylosis 10/04/2018  . Chronic respiratory failure with hypoxia ([place]) 09/16/2018  . Bilateral recurrent inguinal hernias 11/28/2017  . Carotid artery stenosis 01/12/2016  . Chronic systolic CHF (congestive heart failure) ([place]) 12/02/2015  . 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 ([place]) 10/16/2015  . PVD (peripheral vascular disease) ([place])   . Hereditary and idiopathic peripheral neuropathy 10/02/2015  . Peripelvic (lymphatic) cyst   . Pernicious anemia 07/15/2015  . Bilateral renal cysts 07/12/2015  . Lung nodule, 40mm RML CT 07/12/15 07/12/2015  . Stage 3 chronic kidney disease 06/14/2015  . Paroxysmal atrial fibrillation ([place]) 05/28/2015  . AAA (abdominal aortic aneurysm) without rupture ([place]) 09/30/2014  . Cardiomyopathy, ischemic 05/23/2014  . DM (diabetes mellitus), type 2 with renal complications ([place]) 42/59/5638  . Depression, major, single episode, complete remission ([place]) 03/18/2010  . Hypothyroidism 02/03/2009  . Osteoarthritis 05/27/2008  . Hyperlipidemia associated with type 2  diabetes mellitus ([place]) 06/14/2007  . Hypertension associated with diabetes ([place]) 06/14/2007  . GERD 06/14/2007    Current Outpatient Medications:  .  acetaminophen (TYLENOL) 500 MG tablet, Take 2 tablets (1,000 mg total) by mouth every 8 (eight) hours as needed., Disp: 30 tablet, Rfl: 0 .  acetaminophen (TYLENOL) 500 MG tablet, Take 1,000 mg by mouth every 6 (six) hours as needed for mild pain., Disp: , Rfl:  .  amiodarone (PACERONE) 200 MG tablet, Take 100 mg by mouth daily., Disp: , Rfl:  .  apixaban (ELIQUIS) 2.5 MG TABS tablet, Take 2.5 mg by mouth 2 (two) times daily., Disp: , Rfl:  .  atorvastatin (LIPITOR) 80 MG tablet, Take 80 mg by mouth daily., Disp: , Rfl:  .  carvedilol (COREG) 12.5 MG tablet, Take 12.5 mg by mouth 2 (two) times daily with a meal., Disp: , Rfl:  .  cilostazol (PLETAL) 100 MG tablet, Take 1 tablet (100 mg total) by mouth 2 (two) times daily., Disp: 180 tablet, Rfl: 3 .  ezetimibe (ZETIA) 10 MG tablet, TAKE (1) TABLET DAILY., Disp: 90 tablet, Rfl: 0 .  FLUoxetine (PROZAC) 20 MG capsule, TAKE (1) CAPSULE DAILY., Disp: 90 capsule, Rfl: 0 .  furosemide (LASIX) 80 MG tablet, Take 1.5 tablets (120 mg total) by mouth 2 (two) times daily., Disp: 90 tablet, Rfl: 6 .  gabapentin (NEURONTIN) 100 MG capsule, Take 100 mg by mouth 3 (three) times daily., Disp: , Rfl:  .  hydrALAZINE (APRESOLINE) 50 MG tablet, Take 50 mg by mouth 3 (three) times daily., Disp: , Rfl:  .  HYDROcodone-acetaminophen (NORCO) 5-325 MG tablet, Take 1 tablet by mouth every 6 (six) hours as needed for moderate pain., Disp: 12 tablet, Rfl: 0 .  isosorbide mononitrate (IMDUR) 30 MG 24 hr tablet, Take 30 mg by mouth daily., Disp: , Rfl:  .  levothyroxine (SYNTHROID) 100 MCG tablet, Take 100 mcg by mouth daily., Disp: , Rfl:  .  Multiple Vitamins-Minerals (MULTIVITAMIN ADULTS 50+) TABS, Take 1 tablet by mouth daily., Disp: , Rfl:  .  pantoprazole (PROTONIX) 40 MG tablet, Take 1 tablet (40 mg total) by mouth 2  (two) times daily., Disp: , Rfl:  .  potassium chloride SA (KLOR-CON) 20 MEQ tablet, Take 20 mEq by mouth daily., Disp: , Rfl:  .  SANTYL ointment, Apply topically., Disp: , Rfl:  .  tiotropium (SPIRIVA) 18 MCG inhalation capsule, Place 18 mcg into inhaler and inhale daily as needed (for wheezing)., Disp: , Rfl:  .  glucose blood (ACCU-CHEK AVIVA PLUS) test strip, CHECK BLOOD SUGAR 4 TIMES A DAY., Disp: 100 each, Rfl: 11 .  metolazone (ZAROXOLYN) 2.5 MG tablet, Take 1 tablet (2.5 mg total) by mouth as directed. By [facility] Clinic (Patient not taking: Reported on 09/03/2020), Disp: 3 tablet, Rfl: 0 .  nitroGLYCERIN (NITROSTAT) 0.4 MG SL tablet, DISSOLVE 1 TABLET UNDER TONGUE AS NEEDED FOR CHEST PAIN,MAY REPEAT IN5 MINUTES FOR 2 DOSES. (Patient not taking: Reported on 08/27/2020), Disp: 25 tablet, Rfl: 3 Allergies  Allergen Reactions  . Metoprolol Shortness Of Breath, Palpitations and Other (See Comments)    Heart starts racing. Shallow breathing; pt states he also get skin irritation on his legs  . Metformin Hives and Swelling    On legs  . Metformin And Related Nausea And Vomiting      Social History   Socioeconomic History  . Marital status: Married    Spouse name: Not on file  . Number of children: Not on file  . Years of education: Not on file  . Highest education level: Not on file  Occupational History  . Occupation: Retired  Tobacco Use  . Smoking status: Never Smoker  . Smokeless tobacco: Never Used  . Tobacco comment: quit atleast 25 yrs ago, per pt  Vaping Use  . Vaping Use: Never used  Substance and Sexual Activity  . Alcohol use: Never    Comment: socially  . Drug use: Never  . Sexual activity: Not Currently  Other Topics Concern  . Not on file  Social History Narrative   ** Merged History Encounter **       Pt lives in [place] with spouse. Retired from [employer].  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   Social  Determinants of Health   Financial Resource Strain:   . Difficulty of Paying Living Expenses: Not on file  Food Insecurity:   . Worried About Charity fundraiser in the Last Year: Not on file  . Ran Out of Food in the Last Year: Not on file  Transportation Needs:   . Lack of Transportation (Medical): Not on file  . Lack of Transportation (Non-Medical): Not on file  Physical Activity:   . Days of Exercise per Week: Not on file  . Minutes of Exercise per Session: Not on file  Stress:   . Feeling of Stress : Not on file  Social Connections:   . Frequency of Communication with Friends and Family: Not on file  . Frequency of Social Gatherings with Friends and Family: Not on file  . Attends Religious Services: Not on file  . Active Member of Clubs or Organizations: Not on file  . Attends Archivist  Meetings: Not on file  . Marital Status: Not on file  Intimate Partner Violence:   . Fear of Current or Ex-Partner: Not on file  . Emotionally Abused: Not on file  . Physically Abused: Not on file  . Sexually Abused: Not on file    Physical Exam Cardiovascular:     Rate and Rhythm: Normal rate and regular rhythm.     Pulses: Normal pulses.  Pulmonary:     Effort: Pulmonary effort is normal.     Breath sounds: Normal breath sounds.  Musculoskeletal:        General: Normal range of motion.     Right lower leg: Edema present.     Left lower leg: Edema present.  Skin:    General: Skin is warm and dry.     Capillary Refill: Capillary refill takes less than 2 seconds.  Neurological:     Mental Status: He is oriented to person, place, and time.  Psychiatric:        Mood and Affect: Mood normal.         Future Appointments  Date Time Provider [place]  09/04/2020  2:30 PM Evelina Bucy, DPM [facility]  09/08/2020 12:00 PM Larey Dresser, MD [facility] None  09/11/2020  1:00 PM [facility] - [facility]  09/11/2020  2:15 PM [facility]    BP 126/63 (BP Location: Left Arm, Patient Position: Sitting, Cuff Size: Normal)   Pulse 72   Wt 186 lb (84.4 kg)   SpO2 95%   BMI 29.13 kg/m   Weight yesterday- did not weigh Last visit weight- 187.4 lb  Mr Levene was seen at home today and reported feeling generally well with the exception of bilateral lower leg pain. He continues to have a lot of weeping about his lower legs and complains of a burning sensation but reported that "it isn't as bad today as yesterday." He has an appointment with a podiatrist tomorrow where he has already been seen for this issue once. He denied chest pain, SOB, headache, dizziness, orthopnea, fever or cough over the past week. He remains compliant with his medications and his weight has been stable. His medications were verified and his pillbox was refilled. I will follow up in two weeks.   [name], EMT 09/03/20  ACTION: Home visit completed Next visit planned for 2 weeks

## 2020-09-04 ENCOUNTER — Other Ambulatory Visit: Payer: Self-pay | Admitting: Podiatry

## 2020-09-04 ENCOUNTER — Encounter: Payer: Self-pay | Admitting: Podiatry

## 2020-09-04 ENCOUNTER — Telehealth: Payer: Self-pay

## 2020-09-04 ENCOUNTER — Other Ambulatory Visit: Payer: Self-pay

## 2020-09-04 ENCOUNTER — Ambulatory Visit (INDEPENDENT_AMBULATORY_CARE_PROVIDER_SITE_OTHER): Payer: Medicare PPO

## 2020-09-04 ENCOUNTER — Ambulatory Visit: Payer: Medicare PPO | Admitting: Podiatry

## 2020-09-04 DIAGNOSIS — M86672 Other chronic osteomyelitis, left ankle and foot: Secondary | ICD-10-CM | POA: Diagnosis not present

## 2020-09-04 DIAGNOSIS — L97521 Non-pressure chronic ulcer of other part of left foot limited to breakdown of skin: Secondary | ICD-10-CM | POA: Diagnosis not present

## 2020-09-04 DIAGNOSIS — M86172 Other acute osteomyelitis, left ankle and foot: Secondary | ICD-10-CM | POA: Diagnosis not present

## 2020-09-04 NOTE — Telephone Encounter (Signed)
Pt missed OT appt for this week, and wants to move it to next week.

## 2020-09-04 NOTE — Progress Notes (Signed)
  Subjective:  Patient ID: Andrew [name], male    DOB: [birth year],  MRN: [id number]  Chief Complaint  Patient presents with  . Foot Ulcer    left foot, heel f/u appt., painful w/ touch,lower B/ l legs draining    86 y.o. male presents for wound care.  States the wound is hurting more. Objective:  Physical Exam: Wound Location: Left heel Wound Measurement: 3x4 Wound Base: Granular Peri-wound: Normal Exudate: Scant/small amount Serosanguinous exudate wound without warmth, erythema, signs of acute infection  Foot warm to touch  Review of Vascular procedure 08/10/20 - Dr. Donzetta Matters Common iliac artery on the left appeared to have calcified disease but with pullback gradient there was no change.  Left common external leg arteries were heavily tortuous I could not get a catheter down but could get a wire down the common femoral artery which also appeared diseased.  Left lower extremity limited angiography demonstrates the SFA is heavily calcified throughout likely with multiple greater than 50% stenosis but never frankly occludes.  Assessment:   1. Ulcer of left foot, limited to breakdown of skin ([place])   2. Other chronic osteomyelitis of left foot ([place])   3. Other acute osteomyelitis of left foot ([place])    Plan:  Patient was evaluated and treated and all questions answered.  Ulcer left heel -Wound worsened c/t previous -XR taken, no definite signs of osteomyelitis -Order MRI to eval for osteomyelitis -No debridement performed today -Continue Santyl.  Dressed with silvadene today.  Educated on performing dressing change   Return in about 2 weeks (around 09/18/2020) for Wound Care.

## 2020-09-05 ENCOUNTER — Other Ambulatory Visit: Payer: Self-pay | Admitting: Podiatry

## 2020-09-06 NOTE — Telephone Encounter (Signed)
Please Advise

## 2020-09-07 DIAGNOSIS — I5022 Chronic systolic (congestive) heart failure: Secondary | ICD-10-CM | POA: Diagnosis not present

## 2020-09-07 DIAGNOSIS — N184 Chronic kidney disease, stage 4 (severe): Secondary | ICD-10-CM | POA: Diagnosis not present

## 2020-09-07 DIAGNOSIS — E1122 Type 2 diabetes mellitus with diabetic chronic kidney disease: Secondary | ICD-10-CM | POA: Diagnosis not present

## 2020-09-07 DIAGNOSIS — E1151 Type 2 diabetes mellitus with diabetic peripheral angiopathy without gangrene: Secondary | ICD-10-CM | POA: Diagnosis not present

## 2020-09-07 DIAGNOSIS — I130 Hypertensive heart and chronic kidney disease with heart failure and stage 1 through stage 4 chronic kidney disease, or unspecified chronic kidney disease: Secondary | ICD-10-CM | POA: Diagnosis not present

## 2020-09-07 DIAGNOSIS — D631 Anemia in chronic kidney disease: Secondary | ICD-10-CM | POA: Diagnosis not present

## 2020-09-07 DIAGNOSIS — J9611 Chronic respiratory failure with hypoxia: Secondary | ICD-10-CM | POA: Diagnosis not present

## 2020-09-07 DIAGNOSIS — L89620 Pressure ulcer of left heel, unstageable: Secondary | ICD-10-CM | POA: Diagnosis not present

## 2020-09-07 DIAGNOSIS — J441 Chronic obstructive pulmonary disease with (acute) exacerbation: Secondary | ICD-10-CM | POA: Diagnosis not present

## 2020-09-07 NOTE — Telephone Encounter (Signed)
Noted  

## 2020-09-08 ENCOUNTER — Other Ambulatory Visit: Payer: Self-pay

## 2020-09-08 ENCOUNTER — Ambulatory Visit (HOSPITAL_COMMUNITY)
Admission: RE | Admit: 2020-09-08 | Discharge: 2020-09-08 | Disposition: A | Discharge status: Home / Self Care | Payer: Medicare PPO | Source: Ambulatory Visit | Attending: Cardiology | Admitting: Cardiology

## 2020-09-08 ENCOUNTER — Other Ambulatory Visit (HOSPITAL_COMMUNITY): Payer: Self-pay

## 2020-09-08 VITALS — BP 130/75 | HR 63 | Wt 186.6 lb

## 2020-09-08 DIAGNOSIS — Z7901 Long term (current) use of anticoagulants: Secondary | ICD-10-CM | POA: Insufficient documentation

## 2020-09-08 DIAGNOSIS — E1122 Type 2 diabetes mellitus with diabetic chronic kidney disease: Secondary | ICD-10-CM | POA: Diagnosis not present

## 2020-09-08 DIAGNOSIS — J441 Chronic obstructive pulmonary disease with (acute) exacerbation: Secondary | ICD-10-CM | POA: Diagnosis not present

## 2020-09-08 DIAGNOSIS — D631 Anemia in chronic kidney disease: Secondary | ICD-10-CM | POA: Diagnosis not present

## 2020-09-08 DIAGNOSIS — I2510 Atherosclerotic heart disease of native coronary artery without angina pectoris: Secondary | ICD-10-CM | POA: Diagnosis not present

## 2020-09-08 DIAGNOSIS — N184 Chronic kidney disease, stage 4 (severe): Secondary | ICD-10-CM | POA: Diagnosis not present

## 2020-09-08 DIAGNOSIS — I252 Old myocardial infarction: Secondary | ICD-10-CM | POA: Insufficient documentation

## 2020-09-08 DIAGNOSIS — R911 Solitary pulmonary nodule: Secondary | ICD-10-CM | POA: Insufficient documentation

## 2020-09-08 DIAGNOSIS — E785 Hyperlipidemia, unspecified: Secondary | ICD-10-CM | POA: Diagnosis not present

## 2020-09-08 DIAGNOSIS — I130 Hypertensive heart and chronic kidney disease with heart failure and stage 1 through stage 4 chronic kidney disease, or unspecified chronic kidney disease: Secondary | ICD-10-CM | POA: Diagnosis not present

## 2020-09-08 DIAGNOSIS — Z833 Family history of diabetes mellitus: Secondary | ICD-10-CM | POA: Insufficient documentation

## 2020-09-08 DIAGNOSIS — I480 Paroxysmal atrial fibrillation: Secondary | ICD-10-CM | POA: Diagnosis not present

## 2020-09-08 DIAGNOSIS — E039 Hypothyroidism, unspecified: Secondary | ICD-10-CM | POA: Insufficient documentation

## 2020-09-08 DIAGNOSIS — E1151 Type 2 diabetes mellitus with diabetic peripheral angiopathy without gangrene: Secondary | ICD-10-CM | POA: Diagnosis not present

## 2020-09-08 DIAGNOSIS — K219 Gastro-esophageal reflux disease without esophagitis: Secondary | ICD-10-CM | POA: Insufficient documentation

## 2020-09-08 DIAGNOSIS — N183 Chronic kidney disease, stage 3 unspecified: Secondary | ICD-10-CM | POA: Insufficient documentation

## 2020-09-08 DIAGNOSIS — Z8249 Family history of ischemic heart disease and other diseases of the circulatory system: Secondary | ICD-10-CM | POA: Diagnosis not present

## 2020-09-08 DIAGNOSIS — L89620 Pressure ulcer of left heel, unstageable: Secondary | ICD-10-CM | POA: Diagnosis not present

## 2020-09-08 DIAGNOSIS — I739 Peripheral vascular disease, unspecified: Secondary | ICD-10-CM | POA: Diagnosis not present

## 2020-09-08 DIAGNOSIS — J9611 Chronic respiratory failure with hypoxia: Secondary | ICD-10-CM | POA: Diagnosis not present

## 2020-09-08 DIAGNOSIS — Z951 Presence of aortocoronary bypass graft: Secondary | ICD-10-CM | POA: Insufficient documentation

## 2020-09-08 DIAGNOSIS — I5032 Chronic diastolic (congestive) heart failure: Secondary | ICD-10-CM | POA: Diagnosis not present

## 2020-09-08 DIAGNOSIS — J449 Chronic obstructive pulmonary disease, unspecified: Secondary | ICD-10-CM | POA: Diagnosis not present

## 2020-09-08 DIAGNOSIS — I4892 Unspecified atrial flutter: Secondary | ICD-10-CM | POA: Insufficient documentation

## 2020-09-08 DIAGNOSIS — Z79899 Other long term (current) drug therapy: Secondary | ICD-10-CM | POA: Diagnosis not present

## 2020-09-08 DIAGNOSIS — I255 Ischemic cardiomyopathy: Secondary | ICD-10-CM | POA: Insufficient documentation

## 2020-09-08 DIAGNOSIS — I6529 Occlusion and stenosis of unspecified carotid artery: Secondary | ICD-10-CM | POA: Insufficient documentation

## 2020-09-08 DIAGNOSIS — I5022 Chronic systolic (congestive) heart failure: Secondary | ICD-10-CM | POA: Diagnosis not present

## 2020-09-08 DIAGNOSIS — S91301A Unspecified open wound, right foot, initial encounter: Secondary | ICD-10-CM | POA: Diagnosis not present

## 2020-09-08 DIAGNOSIS — Z8673 Personal history of transient ischemic attack (TIA), and cerebral infarction without residual deficits: Secondary | ICD-10-CM | POA: Insufficient documentation

## 2020-09-08 DIAGNOSIS — Z79890 Hormone replacement therapy (postmenopausal): Secondary | ICD-10-CM | POA: Insufficient documentation

## 2020-09-08 DIAGNOSIS — Z85820 Personal history of malignant melanoma of skin: Secondary | ICD-10-CM | POA: Insufficient documentation

## 2020-09-08 LAB — LIPID PANEL
Cholesterol: 92 mg/dL (ref 0–200)
HDL: 36 mg/dL — ABNORMAL LOW (ref >40)
LDL Cholesterol: 42 mg/dL (ref 0–99)
Total CHOL/HDL Ratio: 2.6 RATIO
Triglycerides: 70 mg/dL (ref <150)
VLDL: 14 mg/dL (ref 0–40)

## 2020-09-08 LAB — COMPREHENSIVE METABOLIC PANEL
ALT: 33 U/L (ref 0–44)
AST: 36 U/L (ref 15–41)
Albumin: 2.9 g/dL — ABNORMAL LOW (ref 3.5–5.0)
Alkaline Phosphatase: 70 U/L (ref 38–126)
Anion gap: 9 (ref 5–15)
BUN: 41 mg/dL — ABNORMAL HIGH (ref 8–23)
CO2: 28 mmol/L (ref 22–32)
Calcium: 8.9 mg/dL (ref 8.9–10.3)
Chloride: 103 mmol/L (ref 98–111)
Creatinine, Ser: 1.94 mg/dL — ABNORMAL HIGH (ref 0.61–1.24)
GFR calc Af Amer: 35 mL/min — ABNORMAL LOW (ref >60)
GFR calc non Af Amer: 30 mL/min — ABNORMAL LOW (ref >60)
Glucose, Bld: 285 mg/dL — ABNORMAL HIGH (ref 70–99)
Potassium: 4.2 mmol/L (ref 3.5–5.1)
Sodium: 140 mmol/L (ref 135–145)
Total Bilirubin: 0.8 mg/dL (ref 0.3–1.2)
Total Protein: 6.2 g/dL — ABNORMAL LOW (ref 6.5–8.1)

## 2020-09-08 LAB — CBC
HCT: 31.2 % — ABNORMAL LOW (ref 39.0–52.0)
Hemoglobin: 9.7 g/dL — ABNORMAL LOW (ref 13.0–17.0)
MCH: 31.9 pg (ref 26.0–34.0)
MCHC: 31.1 g/dL (ref 30.0–36.0)
MCV: 102.6 fL — ABNORMAL HIGH (ref 80.0–100.0)
Platelets: 175 10*3/uL (ref 150–400)
RBC: 3.04 MIL/uL — ABNORMAL LOW (ref 4.22–5.81)
RDW: 12.8 % (ref 11.5–15.5)
WBC: 6.9 10*3/uL (ref 4.0–10.5)
nRBC: 0 % (ref 0.0–0.2)

## 2020-09-08 LAB — TSH: TSH: 1.456 u[IU]/mL (ref 0.350–4.500)

## 2020-09-08 MED ORDER — TORSEMIDE 20 MG PO TABS
80.0000 mg | ORAL_TABLET | Freq: Two times a day (BID) | ORAL | 3 refills | Status: DC
Start: 2020-09-08 — End: 2021-01-14

## 2020-09-08 NOTE — Patient Instructions (Signed)
STOP Lasix  START Torsemide 20mg  twice daily  Labs done today, your results will be available in MyChart, we will contact you for abnormal readings.  Your physician has requested that you have an echocardiogram. Echocardiography is a painless test that uses sound waves to create images of your heart. It provides your doctor with information about the size and shape of your heart and how well your heart's chambers and valves are working. This procedure takes approximately one hour. There are no restrictions for this procedure.  You have been referred to the Wound Clinic. They will contact you about scheduling an appointment  Follow up with our APP clinic in about 1-2 weeks.  If you have any questions or concerns before your next appointment please send [place] a message through [place] or call our office at [phone].    TO LEAVE A MESSAGE FOR THE NURSE SELECT OPTION 2, PLEASE LEAVE A MESSAGE INCLUDING: . YOUR NAME . DATE OF BIRTH . CALL BACK NUMBER . REASON FOR CALL**this is important as we prioritize the call backs  [place] AS LONG AS YOU CALL BEFORE 4:00 PM  At the [facility], you and your health needs are our priority. As part of our continuing mission to provide you with exceptional heart care, we have created designated Provider Care Teams. These Care Teams include your primary Cardiologist (physician) and Advanced Practice Providers (APPs- Physician Assistants and Nurse Practitioners) who all work together to provide you with the care you need, when you need it.   You may see any of the following providers on your designated Care Team at your next follow up: Marland Kitchen Dr Glori Bickers . Dr Loralie Champagne . Darrick Grinder, NP . Lyda Jester, PA . Audry Riles, PharmD   Please be sure to bring in all your medications bottles to every appointment.

## 2020-09-08 NOTE — Progress Notes (Signed)
Called and spoke w/Katie, community paramedic who is covering for [employer]. She will go out and see pt today to assist with med changes and will then see him later in the week to f/u as well

## 2020-09-08 NOTE — Progress Notes (Signed)
Date:  09/08/2020   ID:  Andrew [name], DOB July 16, 1928, MRN [id number]  Provider location: [facility] Type of Visit: Established patient   PCP:  [name], MD  Cardiologist:  Dr. [name]   History of Present Illness: Andrew [name] is a 86 y.o. male who has a history of CKD, CAD s/p CABG, and ischemic cardiomyopathy with primarily diastolic CHF.  He has PAD followed at [facility].  I took him for [facility] in 11/15. 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  He had had occlusion of a relatively small LAD, there were right to left collaterals and no intervention was done (medical management).    He and his wife went on a cruise along the [employer] in 5/16.  He admits to considerable dietary indiscretion (high sodium diet). It appears that he developed a hypertensive crisis along with chest pain while on the ship. He went into atrial fibrillation and developed CHF.  He was taken to the hospital in [place], [place].  He was in the ICU for about a week on Bipap intermittently.  Troponin peaked at 0.09 during this admission.  He was diuresed and after about 2 wks left the hospital and was able to fly home.    Cardiolite (6/16) showed EF 42%, prior anterolateral MI, no ischemia. Echo in 10/16 with EF 40-45%.    He was admitted on 07/06/15 for RLQ pain and fever.  He was treated for UTI and had a kidney stone as well.  He developed SOB after IVF were given for AKI.  He was treated for acute COPD exacerbation as well as CHF decompensation.  He was diuresed with IV lasix.  His weight on admission was 179 and got as high as 188 after IVF. Discharge weight was 181.  He was admitted in 10/16 with a cerebellar CVA.  He has some resultant imbalance.   He was again admitted in 11/16 with acute on chronic systolic CHF.  This was a short admission that appeared to be precipitated by a sodium load from eating at a [place] [employer] .   3/18 Echo showed stable EF 40-45%.  He  was also having chest pain so I did a Cardiolite in 3/18, no ischemia.   He was admitted in 11/18 for elective hernia repair. Post-op, he developed hypoxemia and had a rehab stay.  He is now off oxygen.   Creatinine increased to 2.97 in 1/19.  I cut back on losartan and Lasix.  He developed increased lower extremity edema and we increased Lasix back to 120 qam/80 qpm.  He had a mechanical fall in 7/20 (tripped). He hit his head but refused to go to the ER.  Later in 7/20, he was admitted with fever and started on antibiotics with concern for PNA, but CT chest did not show definite PNA.  ?Viral syndrome.  COVID-19 was negative.   Echo in 8/20 showed  EF 45-50% with anterolateral hypokinesis, normal RV.   He was admitted in 6/21 with aspiration PNA and COPD exacerbation, sent to [facility] for rehab afterwards.   Patient developed a left heel ulcer. In 8/21, he had peripheral angiogram by Dr. Donzetta Matters showing multiple left SFA stenoses > 50% but no occlusion. The right SFA was occluded with recanalization.  No interventional option.   He returns for followup of CHF and CAD. Weight is down 9 lbs since last appointment.  He is wearing a boot on his left  foot because of the heel ulcer.  He has left heel pain which is limiting his walking.  He uses a walker now.  No dyspnea but not very active.  No chest pain.  Also has ulcer on right lower leg now.   ECG (personally reviewed): NSR, 1st degree AVB, LAFB, poor RWP.   Labs (12/14): K 4.8, creatinine 1.5 Labs (5/15): LDL 92 Labs (11/15): K 4.1, creatinine 1.5 Labs (12/15): K 4, creatinine 1.5 Labs (1/16): LFTs normal, LDL 51, HDL 47 Labs (5/16): TnI 0.09 Labs (6/16): K 4.8 => 4.4, creatinine 1.79 => 2.26, HCT 28.7 Labs (7/16) K 4.2, creatinine 1.98, LFTs normal, TSH normal Labs (8/16): HCT 27.1 Labs (9/16): hgb 8.1, K 4.3, creatinine 1.8, LFTs normal, TSH elevated Labs (10/16): LDL 74, HDL 45, TSH 5.19 (increased), free T3 and free T4 normal.    Labs (11/16): K 3.5, creatinine 1.47, AST 84, ALT 89 Labs (12/16): K 4.3, creatinine 1.58, AST 43, ALT 42 Labs (3/17): K 4.2, creatinine 1.4, hgb 10.7, AST 36, ALT 45 Labs (6/17): K 4, creatinine 1.69, hgb 10.9, proBNP 389, TSH mildly elevated Labs (8/17): Free T4 and T3 normal, TSH mildly elevated, K 4.2, creatinine 1.7, LFTs normal, hgb 10.4 Labs (12/17): TSH elevated but free T3 and T4 normal, K 4.5, creatinine 1.9, LDL 45, HDL 39, LFTs normal, HCT 32.9 Labs (1/18): K 4.4, creatinine 1.93 Labs (3/18): K 4.3, creatinine 1.87, BNP 187, hgb 11.1, LDL 75, LFTs normal Labs (4/18): K 4.1, creatinine 1.7 Labs (9/18): K 4, creatinine 1.8, hgb 11.9 Labs (10/18): free T4 0.4, free T3 1.9, TSH 139  Labs (12/18): K 3.4, creatinine 1.76, hgb 12.2 Labs (1/19): K 4 => 4.9, creatinine 2.7 => 2.97, TSH 179, LFTs normal Labs (2/19): K 4.6, creatinine 2.19 Labs (4/19): LDL 61, HDL 36, K 3.9, creatinine 1.77, LFTs normal  Labs (6/19): hgb 11.7, K 3.8, creatinine 2.09, TSH 10 Labs (9/19): K 3.9, creatinine 1.69 Labs (12/19): K 3.5, creatinine 1.69, LDL 75, HDL 39, TSH normal Labs (7/20): K 3.8, creatinine 1.69 Labs (8/20): K 4, creatinine 1.63, hgb 12.1 Labs (12/20): K 3.9, creatinine 2.05, AST 44, ALT 58, hgb 12.9, TSH normal Labs (2/21): K 4, creatinine 1.99, hgb 12.5, plts 133, LDL 58, HDL 34, AST 72, ALT 87 Labs (8/21): K 3.9, creatinine 1.76  PMH: 1. CKD stage 3 2. HTN 3. Hyperlipidemia 4. AAA: s/p surgery with left renal artery bypass in 1997 - Abdominal US 10/18 with 3.2 AAA 5. GERD 6. IBS 7. Melanoma s/p resection 8. Diabetes: Diet-controlled 9. Carotid stenosis: Carotid dopplers (4/15) with 40-59% bilateral stenosis. Carotid dopplers (7/16) with < 40% BICA stenosis.  Carotid dopplers (10/16) with 40-59% BICA stenosis.  - Carotids (8/17) with minimal disease.  - Carotids (10/18) with 1-39% BICA stenosis.  - Carotids (1/19): 1-39% BICA stenosis 10. PAD: 9/14 ABIs 0.91 right 1.09 left.   7/16 ABIs 0.86 right, 1.1 left; aortoiliac duplex with < 50% bilateral iliac stenosis.  - Aortoiliac duplex (8/17) with < 50% bilateral iliac stenosis.   - ABIs (10/17): left 0.91, right 0.98, left TBI 0.44 (abnormal).  - ABIs (7/18): right 0.97, right 0.75 - ABIs (1/19): right 1.04, left 1.03 - ABIs (7/19): right 1.13, noncompressible left ABI but TBI 0.93 - Peripheral angiogram (8/21): Multiple left SFA stenoses > 50% but no occlusion. The right SFA was occluded with recanalization.  No interventional option.  11. CAD: Has super-dominant RCA. s/p CABG 1992.  He had Taxus DES to mid PDA  in 5/02. LHC (1/05) with 90% ostial PDA (had DES to this vessel), totally occluded RIMA-PDA, totally occluded PLV, sequential SVG-PLV with 1 branch occluded.  LHC (11/15) with left main off the right cusp, 40-50% ostial left main, total occlusion of the proximal LAD (small vessel) with right to left collaterals, large super-dominant RCA with 50% ISR mid RCA, 50% ostial PDA, patent SVG-PLV, RIMA-PDA known atretic. Lexiscan Cardiolite (6/16) with EF 42%, prior anterolateral MI, no ischemia.  - Lexiscan Cardiolite (3/18): EF 51%, basal to mid anterolateral fixed defect with no ischemia.  12. Ischemic cardiomyopathy: Echo (12/09) with EF 45-50%, basal to mid inferolateral hypokinesis. Echo (6/15) with EF 45-50%, akinesis of the mid to apical inferolateral wall.  Echo (6/16) with EF 50-55%, mild LVH, inferior hypokinesis, mild MR.  Echo (10/16) with EF 40-45%, moderate LVH, mildly decreased RV systolic function.  - Echo (3/18) with EF 40-45%, mid anterior hypokinesis.  - Echo (2/19) with EF 60-65%, mild LVH, trade II diastolic dysfunction.  - Echo (8/20): EF 45-50%, anterolateral hypokinesis, normal RV size and systolic function.  13. OA 14. Atrial fibrillation/flutter: Paroxysmal.  Noted during 5/16 hospitalization in [place] and in 6/16.  He developed atrial flutter in 8/16, back in NSR by 9/16.  15. Emphysema: PFTs (8/16)  with FVC 88%, FEV1 74%, ratio 81%, TLC 83%, DLCO 31%. - PFTs (12/18): FVC 64%, FEV1 69%, ratio 105%, TLC 82%, DLCO 33% => mild obstruction, severely decreased DLCO (similar to prior).   16. Renal cysts 17. Idiopathic peripheral neuropathy.  18. Anemia.  24. 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  20. Hernia repair (11/18) 21. Hypothyroidism 22. Bronchiectasis: CT chest 1/19 with bronchiectasis.  - Aspiration PNA and COPD exacerbation in 6/21.  23. Lung nodule: 1.3 cm RML nodule on 1/19 CT, was noted in 2014 => slow growing. Possibly malignant.  Decision made with pulmonary to watch and avoid biopsy.  - CT chest (7/20) with stable RML nodule, no evidence for amiodarone toxicity.    Current Outpatient Medications  Medication Sig Dispense Refill  . acetaminophen (TYLENOL) 500 MG tablet Take 2 tablets (1,000 mg total) by mouth every 8 (eight) hours as needed. 30 tablet 0  . amiodarone (PACERONE) 200 MG tablet Take 100 mg by mouth daily.    Marland Kitchen apixaban (ELIQUIS) 2.5 MG TABS tablet Take 2.5 mg by mouth 2 (two) times daily. (Patient not taking: Reported on 09/08/2020)    . atorvastatin (LIPITOR) 80 MG tablet Take 80 mg by mouth daily.    . carvedilol (COREG) 12.5 MG tablet Take 12.5 mg by mouth 2 (two) times daily with a meal.    . cilostazol (PLETAL) 100 MG tablet Take 1 tablet (100 mg total) by mouth 2 (two) times daily. 180 tablet 3  . ezetimibe (ZETIA) 10 MG tablet TAKE (1) TABLET DAILY. 90 tablet 0  . FLUoxetine (PROZAC) 20 MG capsule TAKE (1) CAPSULE DAILY. 90 capsule 0  . gabapentin (NEURONTIN) 100 MG capsule Take 100 mg by mouth 3 (three) times daily.    Marland Kitchen glucose blood (ACCU-CHEK AVIVA PLUS) test strip CHECK BLOOD SUGAR 4 TIMES A DAY. 100 each 11  . hydrALAZINE (APRESOLINE) 50 MG tablet Take 50 mg by mouth 3 (three) times daily.    Marland Kitchen HYDROcodone-acetaminophen (NORCO/VICODIN) 5-325 MG tablet TAKE 1 TABLET EVERY 6 HOURS AS NEEDED FOR MODERATE PAIN. 12 tablet 0  . isosorbide mononitrate (IMDUR) 30 MG  24 hr tablet Take 30 mg by mouth daily.    Marland Kitchen levothyroxine (SYNTHROID) 100 MCG tablet  Take 100 mcg by mouth daily.    . metolazone (ZAROXOLYN) 2.5 MG tablet Take 1 tablet (2.5 mg total) by mouth as directed. By [facility] Clinic 3 tablet 0  . Multiple Vitamins-Minerals (MULTIVITAMIN ADULTS 50+) TABS Take 1 tablet by mouth daily.    . nitroGLYCERIN (NITROSTAT) 0.4 MG SL tablet DISSOLVE 1 TABLET UNDER TONGUE AS NEEDED FOR CHEST PAIN,MAY REPEAT IN5 MINUTES FOR 2 DOSES. (Patient not taking: Reported on 09/08/2020) 25 tablet 3  . pantoprazole (PROTONIX) 40 MG tablet Take 1 tablet (40 mg total) by mouth 2 (two) times daily.    . potassium chloride SA (KLOR-CON) 20 MEQ tablet Take 20 mEq by mouth daily.    Marland Kitchen tiotropium (SPIRIVA) 18 MCG inhalation capsule Place 18 mcg into inhaler and inhale daily as needed (for wheezing).    . torsemide (DEMADEX) 20 MG tablet Take 4 tablets (80 mg total) by mouth 2 (two) times daily. 240 tablet 3   No current facility-administered medications for this encounter.    Allergies:   Metoprolol, Metformin, and Metformin and related   Social History:  The patient  reports that he has never smoked. He has never used smokeless tobacco. He reports that he does not drink alcohol and does not use drugs.   Family History:  The patient's family history includes Colon cancer in his father; Coronary artery disease in his brother; Diabetes in his brother and son; Heart disease in his brother; Hyperlipidemia in his brother and son; Peripheral vascular disease in his brother; Throat cancer in his brother.   ROS:  Please see the history of present illness.   All other systems are personally reviewed and negative.   Exam:   BP 130/75   Pulse 63   Wt 84.6 kg (186 lb 9.6 oz)   SpO2 93%   BMI 29.23 kg/m  General: NAD Neck: JVP 8-9 cm, no thyromegaly or thyroid nodule.  Lungs: Clear to auscultation bilaterally with normal respiratory effort. CV: Nondisplaced PMI.  Heart regular S1/S2, no  S3/S4, 1/6 SEM RUSB.  1+ edema to knees.  No carotid bruit.  Unable to palpate pedal pulses.  Abdomen: Soft, nontender, no hepatosplenomegaly, no distention.  Skin: Intact without lesions or rashes.  Neurologic: Alert and oriented x 3.  Psych: Normal affect. Extremities: No clubbing or cyanosis. Lower legs wrapped.  HEENT: Normal.   Recent Labs: 06/03/2020: B Natriuretic Peptide 1,981.3 06/11/2020: Magnesium 2.0 09/08/2020: ALT 33; BUN 41; Creatinine, Ser 1.94; Hemoglobin 9.7; Platelets 175; Potassium 4.2; Sodium 140; TSH 1.456  Personally reviewed   Wt Readings from Last 3 Encounters:  09/08/20 84.6 kg (186 lb 9.6 oz)  09/03/20 84.4 kg (186 lb)  08/27/20 85 kg (187 lb 6.4 oz)      ASSESSMENT AND PLAN:  1. CAD: s/p CABG.  Cath in 11/15 showed occluded LAD with left to right collaterals.  The LAD was a relatively small vessel (super-dominant right).  Managed medically. Lexiscan Cardiolite in 6/16 with infarction but no ischemia.  Cardiolite 3/18 with infarction, no ischemia. No chest pain.  - Continue atorvastatin, he is not on ASA given use of Eliquis.  2. Chronic primarily diastolic CHF/ischemic cardiomyopathy: EF 40-45% on 3/18 echo.  Echo 8/20 with EF 45-50%, normal RV.  NYHA III. Though weight is down, he looks volume overloaded on exam.  - Stop Lasix, start torsemide 80 mg bid.  BMET today and in 1 week.    - Continue hydralazine 50 mg tid.    - Continue Imdur 30  mg daily.    - Continue Coreg 12.5 mg bid.  - Not on ACEi/ARB/spironolactone with elevated creatinine.    3. Carotid stenosis: Stable. Due for repeat carotid dopplers, needs to make followup at [facility].   4. PAD: Severe bilateral SFA disease on 8/21 peripheral angiogram, no revascularization options.  He has ulcers on left heel and right lower leg.   - Continue cilostazol (severe claudication if he stops it, though not ideal with CHF).  - Needs appointment ASAP in wound clinic for wound management.  Also sees podiatry.  I am  concerned that he could require an amputation on the left.  Noted plan for MRI to look for osteomyelitis.   5. Hyperlipidemia: Continue atorvastatin, check lipids today.  6. CKD III:  BMET today.  7. Atrial fibrillation/flutter:  Paroxysmal.  NSR today.  - Continue amiodarone 100 mg daily. Will need to get regular eye exams with amiodarone use.  CT chest in 7/20 did not appear to show evidence for amiodarone lung toxicity.  LFTs today.  He will need regular eye exam.   PCP follows TSH (hypothyroid).  - Continue Eliquis 2.5 bid.  8. Suspected OSA:  Has declined sleep study.   9. COPD: He is now off oxygen. He has bronchiectasis thought to be due to chronic aspiration.  Hospitalization in 6/21 with PNA and COPD exacerbation.  - Follows with pulmonary.  10. CVA: Cerebellar, 10/16.  He has had some resulting imbalance.  - He is no longer driving.  11. Hypertension: BP has been controlled when he takes his medications.  12. Hypothyroidism: Likely related to amiodarone. - Followed by PCP.  13. Pulmonary nodule: 1.3 cm RML nodule on 1/19 CT chest.  It was also seen in 2014 => slow growing.  Dr Lake Bells has discussed with wife and they have decided not to go forward with biopsy. They would not be interested in lung cancer treatment.  CT chest in 7/20 showed stable 1 cm RML nodule.   Followup in 1 week with NP/PA to reassess volume status.   Signed, Loralie Champagne, MD  09/08/2020  [facility] [address] [facility] [place] [address] [phone] (office) [phone] (fax)

## 2020-09-08 NOTE — Progress Notes (Signed)
Paramedicine Encounter   Arrived for visit for Mr. Martel to verify medications and replace Lasix with Torsemide. I completed same and filled box #2 with Gabapentin as requested by Zack. I will come out for full visit on Thursday to see how Mr. Murtagh is responding to Torsemide. Visit complete.   Physical Exam      Future Appointments  Date Time Provider [place]  09/11/2020  1:00 PM [facility] VASC 4 - SS [facility]  09/11/2020  2:15 PM [facility] PA [facility]  09/15/2020  2:15 PM MC ECHO OP 2 [facility]  09/15/2020  3:30 PM [facility] PA/NP [facility] None  09/18/2020  3:30 PM Evelina Bucy, DPM [facility]  09/30/2020  1:15 PM Joaquim Lai, Kirke Corin III, PA-C [facility]     ACTION: Home visit completed Next visit planned for Thursday

## 2020-09-10 ENCOUNTER — Other Ambulatory Visit (HOSPITAL_COMMUNITY): Payer: Self-pay

## 2020-09-10 NOTE — Progress Notes (Signed)
Paramedicine Encounter  Arrived to follow up with Andrew [name] after switching to Torsemide and he was sleeping upon my arrival. Andrew [name] advised he has been urinating more frequently and weighed 182lbs. I advised her if he had any complications to call me.    Received a call from Andrew [name] later int he day advising Andrew [name] had slid out of his wheel chair and EMS came to assist and they found no injuries reporting vitals were stable. I advised patient's wife if anything else came up to reach out to me on Monday. She agreed. Andrew [name] will follow up next week.   ACTION: Home visit completed Next visit planned for one week

## 2020-09-11 ENCOUNTER — Other Ambulatory Visit: Payer: Self-pay

## 2020-09-11 ENCOUNTER — Encounter (HOSPITAL_BASED_OUTPATIENT_CLINIC_OR_DEPARTMENT_OTHER): Payer: Medicare PPO | Attending: Physician Assistant | Admitting: Internal Medicine

## 2020-09-11 ENCOUNTER — Ambulatory Visit: Payer: Medicare PPO

## 2020-09-11 ENCOUNTER — Telehealth: Payer: Self-pay | Admitting: Family Medicine

## 2020-09-11 ENCOUNTER — Ambulatory Visit (HOSPITAL_COMMUNITY): Payer: Medicare PPO

## 2020-09-11 DIAGNOSIS — L97811 Non-pressure chronic ulcer of other part of right lower leg limited to breakdown of skin: Secondary | ICD-10-CM | POA: Diagnosis not present

## 2020-09-11 DIAGNOSIS — E1151 Type 2 diabetes mellitus with diabetic peripheral angiopathy without gangrene: Secondary | ICD-10-CM | POA: Insufficient documentation

## 2020-09-11 DIAGNOSIS — L97812 Non-pressure chronic ulcer of other part of right lower leg with fat layer exposed: Secondary | ICD-10-CM | POA: Diagnosis not present

## 2020-09-11 DIAGNOSIS — L97422 Non-pressure chronic ulcer of left heel and midfoot with fat layer exposed: Secondary | ICD-10-CM | POA: Insufficient documentation

## 2020-09-11 DIAGNOSIS — E11621 Type 2 diabetes mellitus with foot ulcer: Secondary | ICD-10-CM | POA: Insufficient documentation

## 2020-09-11 DIAGNOSIS — W19XXXA Unspecified fall, initial encounter: Secondary | ICD-10-CM | POA: Insufficient documentation

## 2020-09-11 DIAGNOSIS — I110 Hypertensive heart disease with heart failure: Secondary | ICD-10-CM | POA: Diagnosis not present

## 2020-09-11 DIAGNOSIS — L97822 Non-pressure chronic ulcer of other part of left lower leg with fat layer exposed: Secondary | ICD-10-CM | POA: Diagnosis not present

## 2020-09-11 DIAGNOSIS — I509 Heart failure, unspecified: Secondary | ICD-10-CM | POA: Diagnosis not present

## 2020-09-11 DIAGNOSIS — S51012A Laceration without foreign body of left elbow, initial encounter: Secondary | ICD-10-CM | POA: Diagnosis not present

## 2020-09-11 DIAGNOSIS — L97821 Non-pressure chronic ulcer of other part of left lower leg limited to breakdown of skin: Secondary | ICD-10-CM | POA: Diagnosis not present

## 2020-09-11 DIAGNOSIS — I87333 Chronic venous hypertension (idiopathic) with ulcer and inflammation of bilateral lower extremity: Secondary | ICD-10-CM | POA: Insufficient documentation

## 2020-09-11 DIAGNOSIS — I739 Peripheral vascular disease, unspecified: Secondary | ICD-10-CM

## 2020-09-11 NOTE — Telephone Encounter (Signed)
Radovan - Occupational Therapist- from [facility] called stating he was supposed to see pt today for OT visit. Pt told OT he had to cancel for today. It was supposed to be his last visit. OT is asking for verbal orders to go see pt next week for last visit. Please return call and ok to LVM .

## 2020-09-14 NOTE — Progress Notes (Signed)
Andrew, [name] ([id number]) Visit Report for 09/11/2020 Chief Complaint Document Details Patient Name: Date of Service: Andrew [name], [name] 09/11/2020 10:30 A M Medical Record Number: [id number] Patient Account Number: [id number] Date of Birth/Sex: Treating RN: 1928-04-24 (86 y.o. Andrew [name] Primary Care Provider: Dimas [name] Other Clinician: Referring Provider: Treating Provider/Extender: Andrew [name] in Treatment: 0 Information Obtained from: Patient Chief Complaint 09/11/2020; patient is here for review of wounds on his bilateral lower extremities and left heel Electronic Signature(s) Signed: 09/14/2020 8:08:43 AM By: Andrew Ham MD Entered By: Andrew [name] on 09/11/2020 12:36:10 -------------------------------------------------------------------------------- HPI Details Patient Name: Date of Service: Andrew [name], [name] 09/11/2020 10:30 A M Medical Record Number: [id number] Patient Account Number: [id number] Date of Birth/Sex: Treating RN: 1928/08/09 (86 y.o. Andrew [name] Primary Care Provider: Dimas [name] Other Clinician: Referring Provider: Treating Provider/Extender: Andrew [name] in Treatment: 0 History of Present Illness HPI Description: 09/11/2020 ADMISSION This is a soon-to-be 86 year old man who was hospitalized in June and subsequently spent time at [place] home. At some point he developed a pressure ulcer on his left heel. On discharge he was seen by podiatry and referred to Dr. Donzetta [name] with regards to the possibility of PAD. The patient underwent angiography on 08/10/2020 this showed heavily calcified vessels. The common iliac artery on the left appeared to have calcific disease but there was no gradient. Left common external leg arteries were heavily tortuous left lower extremity limited angiography developed demonstrated a SFA to be helped heavily calcified throughout with multiple  greater than 50% stenosis but never frankly occludes he was not felt to be a candidate for endovascular repair. I think they had a minimal look at the right side and it appeared that the SFA was occluded and reconstitutes above the knee. The overall feeling was that if he could not heal his left heel ulcer he will eventually best be served by a left above-knee amputation The last I can see is that he should have been using a Santyl wet-to-dry dressing to the heel. During his stay with podiatry was also noted that he had venous insufficiency and I think venous insufficiency wounds. His wife tells me that he has been recommended for compression stockings in the past but the patient refused, he has wounds on his bilateral anterior lower legs right greater than the left. Chronic venous inflammation and dry flaking skin. Past medical history; heart failure, right heel wound, paroxysmal atrial fibrillation on Eliquis, abdominal aortic aneurysm with previous repair, COPD, DVT, melanoma on his back, diabetes with a recent hemoglobin A1c of 7.3 however he is not on any treatment [type II], PAD, coronary artery disease, hypertension The patient's most recent arterial studies were on 05/28/2020. This showed an ABI on the right at 1.12 but monophasic waveforms and absent waveforms at the posterior tibial artery. On the left his ABI was 1.47 [noncompressible] he had triphasic waveforms at the ATA and monophasic at the PTA. His TBI on the right was 1.12 on the left and 0.97. It was felt his ABIs were falsely elevated Electronic Signature(s) Signed: 09/14/2020 8:08:43 AM By: Andrew Ham MD Entered By: Andrew [name] on 09/11/2020 12:46:26 -------------------------------------------------------------------------------- Physical Exam Details Patient Name: Date of Service: Andrew [name], [name] 09/11/2020 10:30 A M Medical Record Number: [id number] Patient Account Number: [id number] Date of Birth/Sex: Treating  RN: [birth year] (86 y.o. Andrew [name] Primary Care Provider: Dimas [name] Other Clinician: Referring Provider: Treating Provider/Extender: Andrew [name]  Andrew [name] Weeks in Treatment: 0 Constitutional Sitting or standing Blood Pressure is within target range for patient.. Pulse regular and within target range for patient.Marland Kitchen Respirations regular, non-labored and within target range.. Temperature is normal and within the target range for the patient.Marland Kitchen Appears in no distress. Ears, Nose, Mouth, and Throat Patient is hard of hearing.Marland Kitchen Respiratory work of breathing is normal. Bilateral breath sounds are clear and equal in all lobes with no wheezes, rales or rhonchi.. Cardiovascular A. fib but drug venous pressure not elevated. Reduced femoral arteries palpable. Popliteal arteries reduced but palpable. Pedal pulses absent bilaterally.. Chronic pitting edema. Chronic stasis dermatitis, chronic venous insufficiency skin changes. Notes Wound exam; On the left heel tip sizable full-thickness injury. Under illumination there is debris on the wound surface. I did not debride this today On his bilateral lower legs right greater than the left. Superficial sloughy wounds. Thick scaly dry skin over the entire area. These were vigorously washed off with wound cleanser and gauze. He has chronic venous insufficiency with stasis dermatitis. Dry flaking skin bilaterally. All of this was vigorously removed using wound cleanser and gauze. He tolerated this poorly Electronic Signature(s) Signed: 09/14/2020 8:08:43 AM By: Andrew Ham MD Entered By: Andrew [name] on 09/11/2020 12:51:29 -------------------------------------------------------------------------------- Physician Orders Details Patient Name: Date of Service: Andrew [name], [name] 09/11/2020 10:30 A M Medical Record Number: [id number] Patient Account Number: [id number] Date of Birth/Sex: Treating RN: 1928/08/03 (86 y.o. Andrew [name] Primary Care Provider: Dimas [name] Other Clinician: Referring Provider: Treating Provider/Extender: Andrew [name] in Treatment: 0 Verbal / Phone Orders: No Diagnosis Coding Follow-up Appointments Return Appointment in 1 week. Dressing Change Frequency Other: - HH to change once a week. Wound Clinic to change on Friday Skin Barriers/Peri-Wound Care Moisturizing lotion TCA Cream or Ointment Wound Cleansing May shower with protection. - cast protector Primary Wound Dressing Wound #1 Anterior Lower Leg Calcium Alginate with Silver Wound #2 Left Calcaneus Iodoflex Wound #3 Right Lower Leg Calcium Alginate with Silver Secondary Dressing ABD pad Zetuvit or Kerramax Heel Cup - on left heel Edema Control Kerlix and Coban - Bilateral Avoid standing for long periods of time Elevate legs to the level of the heart or above for 30 minutes daily and/or when sitting, a frequency of: Exercise regularly [facility] for wound care. - [facility] Electronic Signature(s) Signed: 09/14/2020 8:08:43 AM By: Andrew Ham MD Signed: 09/14/2020 1:33:30 PM By: Kela Millin Entered By: Kela Millin on 09/11/2020 12:05:17 -------------------------------------------------------------------------------- Problem List Details Patient Name: Date of Service: Andrew [name], [name] 09/11/2020 10:30 A M Medical Record Number: [id number] Patient Account Number: [id number] Date of Birth/Sex: Treating RN: 1928-08-03 (86 y.o. Andrew [name] Primary Care Provider: Dimas [name] Other Clinician: Referring Provider: Treating Provider/Extender: Andrew [name] in Treatment: 0 Active Problems ICD-10 Encounter Code Description Active Date MDM Diagnosis E11.621 Type 2 diabetes mellitus with foot ulcer 09/11/2020 No Yes L97.422 Non-pressure chronic ulcer of left heel and midfoot with fat layer exposed 09/11/2020  No Yes I87.333 Chronic venous hypertension (idiopathic) with ulcer and inflammation of 09/11/2020 No Yes bilateral lower extremity L97.811 Non-pressure chronic ulcer of other part of right lower leg limited to breakdown 09/11/2020 No Yes of skin L97.821 Non-pressure chronic ulcer of other part of left lower leg limited to breakdown 09/11/2020 No Yes of skin Inactive Problems Resolved Problems Electronic Signature(s) Signed: 09/14/2020 8:08:43 AM By: Andrew Ham MD Entered By: Andrew [name] on 09/11/2020 12:32:59 --------------------------------------------------------------------------------  Progress Note Details Patient Name: Date of Service: Andrew [name], [name] 09/11/2020 10:30 A M Medical Record Number: [id number] Patient Account Number: [id number] Date of Birth/Sex: Treating RN: 10/03/1928 (86 y.o. Andrew [name] Primary Care Provider: Dimas [name] Other Clinician: Referring Provider: Treating Provider/Extender: Andrew [name] in Treatment: 0 Subjective Chief Complaint Information obtained from Patient 09/11/2020; patient is here for review of wounds on his bilateral lower extremities and left heel History of Present Illness (HPI) 09/11/2020 ADMISSION This is a soon-to-be 86 year old man who was hospitalized in June and subsequently spent time at [place] home. At some point he developed a pressure ulcer on his left heel. On discharge he was seen by podiatry and referred to Dr. Donzetta [name] with regards to the possibility of PAD. The patient underwent angiography on 08/10/2020 this showed heavily calcified vessels. The common iliac artery on the left appeared to have calcific disease but there was no gradient. Left common external leg arteries were heavily tortuous left lower extremity limited angiography developed demonstrated a SFA to be helped heavily calcified throughout with multiple greater than 50% stenosis but never frankly occludes he was not  felt to be a candidate for endovascular repair. I think they had a minimal look at the right side and it appeared that the SFA was occluded and reconstitutes above the knee. The overall feeling was that if he could not heal his left heel ulcer he will eventually best be served by a left above-knee amputation The last I can see is that he should have been using a Santyl wet-to-dry dressing to the heel. During his stay with podiatry was also noted that he had venous insufficiency and I think venous insufficiency wounds. His wife tells me that he has been recommended for compression stockings in the past but the patient refused, he has wounds on his bilateral anterior lower legs right greater than the left. Chronic venous inflammation and dry flaking skin. Past medical history; heart failure, right heel wound, paroxysmal atrial fibrillation on Eliquis, abdominal aortic aneurysm with previous repair, COPD, DVT, melanoma on his back, diabetes with a recent hemoglobin A1c of 7.3 however he is not on any treatment [type II], PAD, coronary artery disease, hypertension The patient's most recent arterial studies were on 05/28/2020. This showed an ABI on the right at 1.12 but monophasic waveforms and absent waveforms at the posterior tibial artery. On the left his ABI was 1.47 [noncompressible] he had triphasic waveforms at the ATA and monophasic at the PTA. His TBI on the right was 1.12 on the left and 0.97. It was felt his ABIs were falsely elevated Patient History Information obtained from Patient. Allergies metoprolol, metformin Family History Cancer - Siblings, Diabetes - Mother,Maternal Grandparents, No family history of Heart Disease, Hereditary Spherocytosis, Hypertension, Kidney Disease, Lung Disease, Seizures, Stroke, Thyroid Problems, Tuberculosis. Social History Former smoker, Marital Status - Married, Alcohol Use - Rarely, Drug Use - No History, Caffeine Use - Daily. Medical  History Eyes Denies history of Cataracts, Glaucoma, Optic Neuritis Ear/Nose/Mouth/Throat Denies history of Chronic sinus problems/congestion, Middle ear problems Hematologic/Lymphatic Denies history of Anemia, Hemophilia, Human Immunodeficiency Virus, Lymphedema, Sickle Cell Disease Respiratory Denies history of Aspiration, Asthma, Chronic Obstructive Pulmonary Disease (COPD), Pneumothorax, Sleep Apnea, Tuberculosis Cardiovascular Patient has history of Congestive Heart Failure, Coronary Artery Disease, Hypertension, Peripheral Arterial Disease, Peripheral Venous Disease Denies history of Angina, Arrhythmia, Hypotension, Myocardial Infarction, Phlebitis, Vasculitis Gastrointestinal Denies history of Cirrhosis , Colitis, Crohnoos, Hepatitis A, Hepatitis B, Hepatitis C Endocrine  Patient has history of Type II Diabetes - 15 Denies history of Type I Diabetes Genitourinary Denies history of End Stage Renal Disease Immunological Denies history of Lupus Erythematosus, Raynaudoos, Scleroderma Integumentary (Skin) Denies history of History of Burn Musculoskeletal Denies history of Gout, Rheumatoid Arthritis, Osteoarthritis, Osteomyelitis Neurologic Denies history of Dementia, Neuropathy, Quadriplegia, Paraplegia, Seizure Disorder Oncologic Denies history of Received Chemotherapy, Received Radiation Psychiatric Denies history of Anorexia/bulimia, Confinement Anxiety Patient is treated with Controlled Diet. Review of Systems (ROS) Constitutional Symptoms (General Health) Denies complaints or symptoms of Fatigue, Fever, Chills, Marked Weight Change. Eyes Denies complaints or symptoms of Dry Eyes, Vision Changes, Glasses / Contacts. Ear/Nose/Mouth/Throat Denies complaints or symptoms of Chronic sinus problems or rhinitis. Respiratory Denies complaints or symptoms of Chronic or frequent coughs, Shortness of Breath. Cardiovascular Denies complaints or symptoms of Chest  pain. Gastrointestinal Denies complaints or symptoms of Frequent diarrhea, Nausea, Vomiting. Endocrine Denies complaints or symptoms of Heat/cold intolerance. Genitourinary Denies complaints or symptoms of Frequent urination. Integumentary (Skin) Complains or has symptoms of Wounds. Musculoskeletal Denies complaints or symptoms of Muscle Pain, Muscle Weakness. Neurologic Denies complaints or symptoms of Numbness/parasthesias. Psychiatric Denies complaints or symptoms of Claustrophobia, Suicidal. Objective Constitutional Sitting or standing Blood Pressure is within target range for patient.. Pulse regular and within target range for patient.Marland Kitchen Respirations regular, non-labored and within target range.. Temperature is normal and within the target range for the patient.Marland Kitchen Appears in no distress. Vitals Time Taken: 11:02 AM, Height: 67 in, Source: Stated, Weight: 185 lbs, Source: Stated, BMI: 29, Temperature: 98 F, Pulse: 77 bpm, Respiratory Rate: 18 breaths/min, Blood Pressure: 104/55 mmHg. Ears, Nose, Mouth, and Throat Patient is hard of hearing.Marland Kitchen Respiratory work of breathing is normal. Bilateral breath sounds are clear and equal in all lobes with no wheezes, rales or rhonchi.. Cardiovascular A. fib but drug venous pressure not elevated. Reduced femoral arteries palpable. Popliteal arteries reduced but palpable. Pedal pulses absent bilaterally.. Chronic pitting edema. Chronic stasis dermatitis, chronic venous insufficiency skin changes. General Notes: Wound exam; ooOn the left heel tip sizable full-thickness injury. Under illumination there is debris on the wound surface. I did not debride this today ooOn his bilateral lower legs right greater than the left. Superficial sloughy wounds. Thick scaly dry skin over the entire area. These were vigorously washed off with wound cleanser and gauze. ooHe has chronic venous insufficiency with stasis dermatitis. Dry flaking skin bilaterally. All  of this was vigorously removed using wound cleanser and gauze. He tolerated this poorly Integumentary (Hair, Skin) Wound #1 status is Open. Original cause of wound was Gradually Appeared. The wound is located on the Anterior Lower Leg. The wound measures 12cm length x 13cm width x 0.1cm depth; 122.522cm^2 area and 12.252cm^3 volume. There is Fat Layer (Subcutaneous Tissue) exposed. There is no tunneling or undermining noted. There is a medium amount of serosanguineous drainage noted. There is small (1-33%) red, pink granulation within the wound bed. There is a large (67-100%) amount of necrotic tissue within the wound bed including Adherent Slough. Wound #2 status is Open. Original cause of wound was Gradually Appeared. The wound is located on the Left Calcaneus. The wound measures 2.5cm length x 3.4cm width x 0.1cm depth; 6.676cm^2 area and 0.668cm^3 volume. There is Fat Layer (Subcutaneous Tissue) exposed. There is no tunneling or undermining noted. There is a medium amount of serosanguineous drainage noted. The wound margin is distinct with the outline attached to the wound base. There is medium (34-66%) pink granulation within the wound bed.  There is a medium (34-66%) amount of necrotic tissue within the wound bed including Adherent Slough. Wound #3 status is Open. Original cause of wound was Gradually Appeared. The wound is located on the Right Lower Leg. The wound measures 15cm length x 13cm width x 0.2cm depth; 153.153cm^2 area and 30.631cm^3 volume. There is Fat Layer (Subcutaneous Tissue) exposed. There is no tunneling or undermining noted. There is a medium amount of serosanguineous drainage noted. There is medium (34-66%) pink granulation within the wound bed. There is a medium (34-66%) amount of necrotic tissue within the wound bed including Adherent Slough. Assessment Active Problems ICD-10 Type 2 diabetes mellitus with foot ulcer Non-pressure chronic ulcer of left heel and midfoot  with fat layer exposed Chronic venous hypertension (idiopathic) with ulcer and inflammation of bilateral lower extremity Non-pressure chronic ulcer of other part of right lower leg limited to breakdown of skin Non-pressure chronic ulcer of other part of left lower leg limited to breakdown of skin Plan Follow-up Appointments: Return Appointment in 1 week. Dressing Change Frequency: Other: - HH to change once a week. Wound Clinic to change on Friday Skin Barriers/Peri-Wound Care: Moisturizing lotion TCA Cream or Ointment Wound Cleansing: May shower with protection. - cast protector Primary Wound Dressing: Wound #1 Anterior Lower Leg: Calcium Alginate with Silver Wound #2 Left Calcaneus: Iodoflex Wound #3 Right Lower Leg: Calcium Alginate with Silver Secondary Dressing: ABD pad Zetuvit or Kerramax Heel Cup - on left heel Edema Control: Kerlix and Coban - Bilateral Avoid standing for long periods of time Elevate legs to the level of the heart or above for 30 minutes daily and/or when sitting, a frequency of: Exercise regularly Home Health: [facility] for wound care. - [facility] #1 patient has chronic venous insufficiency with stasis dermatitis and edema. There are shallow superficial wounds on the bilateral lower legs are related to this. 2. We put Liberal TCA moisturizer silver alginate on the wounds and put this under kerlix Coban 3. He has a diabetic foot ulcer likely pressure related on the left Achilles. This will require debridement likely mechanical debridement but I did not do this today. We put Iodoflex on this wound area 4. He has home health will get them to change this dressing once 5. The patient has significant arterial insufficiency as documented by previous noninvasive tests and his recent angiogram. However his feet are warm. I think there is enough perfusion to heal the leg wounds. I am not certain about the left heel however. Dr. Donzetta [name]  noted he was not a candidate for endovascular repair. 6. After we dressed him his wife noted that they had arterial studies at [facility] this afternoon however we have had him phone to cancel this so we can make plans to deal with the issues of rewrapping his legs. 7. I think it is reasonable that we may be able to heal things here. I also think we might be able to put three layer compression on this but I am hopeful not to have to go that route I spent 45 minutes in review of this patient's past medical history, angiograms, face-to-face evaluation and preparation of this record Electronic Signature(s) Signed: 09/14/2020 8:08:43 AM By: Andrew Ham MD Entered By: Andrew [name] on 09/11/2020 12:57:00 -------------------------------------------------------------------------------- HxROS Details Patient Name: Date of Service: Andrew [name], [name] 09/11/2020 10:30 A M Medical Record Number: [id number] Patient Account Number: [id number] Date of Birth/Sex: Treating RN: Jan 16, 1928 (86 y.o. Jerilynn Mages) Carlene Coria Primary Care Provider:  Dimas [name] Other Clinician: Referring Provider: Treating Provider/Extender: Andrew [name] in Treatment: 0 Information Obtained From Patient Constitutional Symptoms (General Health) Complaints and Symptoms: Negative for: Fatigue; Fever; Chills; Marked Weight Change Eyes Complaints and Symptoms: Negative for: Dry Eyes; Vision Changes; Glasses / Contacts Medical History: Negative for: Cataracts; Glaucoma; Optic Neuritis Ear/Nose/Mouth/Throat Complaints and Symptoms: Negative for: Chronic sinus problems or rhinitis Medical History: Negative for: Chronic sinus problems/congestion; Middle ear problems Respiratory Complaints and Symptoms: Negative for: Chronic or frequent coughs; Shortness of Breath Medical History: Negative for: Aspiration; Asthma; Chronic Obstructive Pulmonary Disease (COPD); Pneumothorax; Sleep Apnea;  Tuberculosis Cardiovascular Complaints and Symptoms: Negative for: Chest pain Medical History: Positive for: Congestive Heart Failure; Coronary Artery Disease; Hypertension; Peripheral Arterial Disease; Peripheral Venous Disease Negative for: Angina; Arrhythmia; Hypotension; Myocardial Infarction; Phlebitis; Vasculitis Gastrointestinal Complaints and Symptoms: Negative for: Frequent diarrhea; Nausea; Vomiting Medical History: Negative for: Cirrhosis ; Colitis; Crohns; Hepatitis A; Hepatitis B; Hepatitis C Endocrine Complaints and Symptoms: Negative for: Heat/cold intolerance Medical History: Positive for: Type II Diabetes - 15 Negative for: Type I Diabetes Time with diabetes: 65 Treated with: Diet Genitourinary Complaints and Symptoms: Negative for: Frequent urination Medical History: Negative for: End Stage Renal Disease Integumentary (Skin) Complaints and Symptoms: Positive for: Wounds Medical History: Negative for: History of Burn Musculoskeletal Complaints and Symptoms: Negative for: Muscle Pain; Muscle Weakness Medical History: Negative for: Gout; Rheumatoid Arthritis; Osteoarthritis; Osteomyelitis Neurologic Complaints and Symptoms: Negative for: Numbness/parasthesias Medical History: Negative for: Dementia; Neuropathy; Quadriplegia; Paraplegia; Seizure Disorder Psychiatric Complaints and Symptoms: Negative for: Claustrophobia; Suicidal Medical History: Negative for: Anorexia/bulimia; Confinement Anxiety Hematologic/Lymphatic Medical History: Negative for: Anemia; Hemophilia; Human Immunodeficiency Virus; Lymphedema; Sickle Cell Disease Immunological Medical History: Negative for: Lupus Erythematosus; Raynauds; Scleroderma Oncologic Medical History: Negative for: Received Chemotherapy; Received Radiation Immunizations Pneumococcal Vaccine: Received Pneumococcal Vaccination: No Implantable Devices None Family and Social History Cancer: Yes - Siblings;  Diabetes: Yes - Mother,Maternal Grandparents; Heart Disease: No; Hereditary Spherocytosis: No; Hypertension: No; Kidney Disease: No; Lung Disease: No; Seizures: No; Stroke: No; Thyroid Problems: No; Tuberculosis: No; Former smoker; Marital Status - Married; Alcohol Use: Rarely; Drug Use: No History; Caffeine Use: Daily; Financial Concerns: No; Food, Clothing or Shelter Needs: No; Support System Lacking: No; Transportation Concerns: No Electronic Signature(s) Signed: 09/14/2020 8:08:43 AM By: Andrew Ham MD Signed: 09/14/2020 1:15:48 PM By: Carlene Coria RN Entered By: Carlene Coria on 09/11/2020 11:12:43 -------------------------------------------------------------------------------- SuperBill Details Patient Name: Date of Service: Andrew [name], [name] 09/11/2020 Medical Record Number: [id number] Patient Account Number: [id number] Date of Birth/Sex: Treating RN: 01-Mar-1928 (86 y.o. 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.821 Non-pressure chronic ulcer of other part of left lower leg limited to breakdown of skin Facility Procedures CPT4 Code: [id number] Description: 79892 - WOUND CARE VISIT-LEV 5 EST PT Modifier: Quantity: 1 Physician Procedures : CPT4 Code Description Modifier [id number] - WC PHYS LEVEL 4 - NEW PT ICD-10 Diagnosis Description E11.621 Type 2 diabetes mellitus with foot ulcer L97.422 Non-pressure chronic ulcer of left heel and midfoot with fat layer  exposed L97.811  Non-pressure chronic ulcer of  other part of right lower leg limited to breakdown of skin L97.821 Non-pressure chronic ulcer of other part of left lower leg limited to breakdown of skin Quantity: 1 Electronic Signature(s) Signed: 09/14/2020 8:08:43 AM By: Andrew Ham MD Signed: 09/14/2020 1:33:30 PM By: Kela Millin Entered By: Kela Millin on 09/11/2020 13:14:15

## 2020-09-14 NOTE — Progress Notes (Signed)
TAJE, LITTLER ([id number]) Visit Report for 09/11/2020 Allergy List Details Patient Name: Date of Service: [name], [name] 09/11/2020 10:30 A M Medical Record Number: [id number] Patient Account Number: [id number] Date of Birth/Sex: Treating RN: 1928/01/06 (86 y.o. Jerilynn Mages) Carlene Coria Primary Care Kashawn Manzano: Dimas Chyle Other Clinician: Referring Trust Crago: Treating Mylinh Cragg/Extender: Thea Silversmith, Andreas Ohm in Treatment: 0 Allergies Active Allergies metoprolol metformin Allergy Notes Electronic Signature(s) Signed: 09/14/2020 1:15:48 PM By: Carlene Coria RN Entered By: Carlene Coria on 09/11/2020 11:03:56 -------------------------------------------------------------------------------- Arrival Information Details Patient Name: Date of Service: [name], [name] 09/11/2020 10:30 A M Medical Record Number: [id number] Patient Account Number: [id number] Date of Birth/Sex: Treating RN: 09-23-1928 (86 y.o. Oval Linsey Primary Care Hadlie Gipson: Dimas Chyle Other Clinician: Referring Isabela Nardelli: Treating Herbert Marken/Extender: Donalee Citrin in Treatment: 0 Visit Information Patient Arrived: Wheel Chair Arrival Time: 10:53 Accompanied By: wife Transfer Assistance: None Patient Identification Verified: Yes Secondary Verification Process Completed: Yes Patient Requires Transmission-Based Precautions: No Patient Has Alerts: No Electronic Signature(s) Signed: 09/14/2020 1:15:48 PM By: Carlene Coria RN Entered By: Carlene Coria on 09/11/2020 11:02:38 -------------------------------------------------------------------------------- Clinic Level of Care Assessment Details Patient Name: Date of Service: [name], [name] 09/11/2020 10:30 A M Medical Record Number: [id number] Patient Account Number: [id number] Date of Birth/Sex: Treating RN: Mar 29, 1928 (86 y.o. [name] Primary Care Haevyn Ury: Dimas Chyle Other Clinician: Referring  Lolitha Tortora: Treating Pierre Cumpton/Extender: Donalee Citrin in Treatment: 0 Clinic Level of Care Assessment Items TOOL 2 Quantity Score X- 1 0 Use when only an EandM is performed on the INITIAL visit ASSESSMENTS - Nursing Assessment / Reassessment X- 1 20 General Physical Exam (combine w/ comprehensive assessment (listed just below) when performed on new pt. evals) X- 1 25 Comprehensive Assessment (HX, ROS, Risk Assessments, Wounds Hx, etc.) ASSESSMENTS - Wound and Skin A ssessment / Reassessment []  - 0 Simple Wound Assessment / Reassessment - one wound X- 3 5 Complex Wound Assessment / Reassessment - multiple wounds []  - 0 Dermatologic / Skin Assessment (not related to wound area) ASSESSMENTS - Ostomy and/or Continence Assessment and Care []  - 0 Incontinence Assessment and Management []  - 0 Ostomy Care Assessment and Management (repouching, etc.) PROCESS - Coordination of Care X - Simple Patient / Family Education for ongoing care 1 15 []  - 0 Complex (extensive) Patient / Family Education for ongoing care X- 1 10 Staff obtains Programmer, systems, Records, T Results / Process Orders est X- 1 10 Staff telephones HHA, Nursing Homes / Clarify orders / etc []  - 0 Routine Transfer to another Facility (non-emergent condition) []  - 0 Routine Hospital Admission (non-emergent condition) X- 1 15 New Admissions / Biomedical engineer / Ordering NPWT Apligraf, etc. , []  - 0 Emergency Hospital Admission (emergent condition) X- 1 10 Simple Discharge Coordination []  - 0 Complex (extensive) Discharge Coordination PROCESS - Special Needs []  - 0 Pediatric / Minor Patient Management []  - 0 Isolation Patient Management []  - 0 Hearing / Language / Visual special needs []  - 0 Assessment of Community assistance (transportation, D/C planning, etc.) []  - 0 Additional assistance / Altered mentation []  - 0 Support Surface(s) Assessment (bed, cushion, seat, etc.) INTERVENTIONS  - Wound Cleansing / Measurement X- 1 5 Wound Imaging (photographs - any number of wounds) []  - 0 Wound Tracing (instead of photographs) []  - 0 Simple Wound Measurement - one wound X- 3 5 Complex Wound Measurement - multiple wounds []  - 0 Simple Wound Cleansing - one wound  X- 3 5 Complex Wound Cleansing - multiple wounds INTERVENTIONS - Wound Dressings []  - 0 Small Wound Dressing one or multiple wounds X- 3 15 Medium Wound Dressing one or multiple wounds []  - 0 Large Wound Dressing one or multiple wounds []  - 0 Application of Medications - injection INTERVENTIONS - Miscellaneous []  - 0 External ear exam []  - 0 Specimen Collection (cultures, biopsies, blood, body fluids, etc.) []  - 0 Specimen(s) / Culture(s) sent or taken to Lab for analysis []  - 0 Patient Transfer (multiple staff / Civil Service fast streamer / Similar devices) []  - 0 Simple Staple / Suture removal (25 or less) []  - 0 Complex Staple / Suture removal (26 or more) []  - 0 Hypo / Hyperglycemic Management (close monitor of Blood Glucose) []  - 0 Ankle / Brachial Index (ABI) - do not check if billed separately Has the patient been seen at the hospital within the last three years: Yes Total Score: 200 Level Of Care: New/Established - Level 5 Electronic Signature(s) Signed: 09/14/2020 1:33:30 PM By: Kela Millin Entered By: Kela Millin on 09/11/2020 12:07:22 -------------------------------------------------------------------------------- Encounter Discharge Information Details Patient Name: Date of Service: [name], [name] 09/11/2020 10:30 A M Medical Record Number: [id number] Patient Account Number: [id number] Date of Birth/Sex: Treating RN: 02/07/1928 (86 y.o. [name] Primary Care Joshwa Hemric: Dimas Chyle Other Clinician: Referring Jonathen Rathman: Treating Luba Matzen/Extender: Donalee Citrin in Treatment: 0 Encounter Discharge Information Items Discharge Condition: Stable Ambulatory  Status: Wheelchair Discharge Destination: Home Transportation: Private Auto Accompanied By: wife Schedule Follow-up Appointment: Yes Clinical Summary of Care: Electronic Signature(s) Signed: 09/11/2020 5:49:45 PM By: Deon Pilling Entered By: Deon Pilling on 09/11/2020 12:40:43 -------------------------------------------------------------------------------- Lower Extremity Assessment Details Patient Name: Date of Service: [name], [name] 09/11/2020 10:30 A M Medical Record Number: [id number] Patient Account Number: [id number] Date of Birth/Sex: Treating RN: August 20, 1928 (86 y.o. Jerilynn Mages) Carlene Coria Primary Care Woody Kronberg: Dimas Chyle Other Clinician: Referring Nataliya Graig: Treating Flecia Shutter/Extender: Donalee Citrin in Treatment: 0 Edema Assessment Assessed: Shirlyn Goltz: No] [Right: No] E[Left: dema] [Right: :] Calf Left: Right: Point of Measurement: 44 cm From Medial Instep 35 cm 35 cm Ankle Left: Right: Point of Measurement: 10 cm From Medial Instep 22 cm 22 cm Electronic Signature(s) Signed: 09/14/2020 1:15:48 PM By: Carlene Coria RN Entered By: Carlene Coria on 09/11/2020 11:32:52 -------------------------------------------------------------------------------- Multi Wound Chart Details Patient Name: Date of Service: [name], [name] 09/11/2020 10:30 A M Medical Record Number: [id number] Patient Account Number: [id number] Date of Birth/Sex: Treating RN: 03-28-1928 (86 y.o. [name] Primary Care Afton Lavalle: Dimas Chyle Other Clinician: Referring Danel Requena: Treating Ramiah Helfrich/Extender: Donalee Citrin in Treatment: 0 Vital Signs Height(in): 27 Pulse(bpm): 46 Weight(lbs): 185 Blood Pressure(mmHg): 104/55 Body Mass Index(BMI): 29 Temperature(F): 98 Respiratory Rate(breaths/min): 18 Photos: [1:No Photos Anterior Lower Leg] [2:No Photos Left Calcaneus] [3:No Photos Right Lower Leg] Wound Location: [1:Gradually Appeared]  [2:Gradually Appeared] [3:Gradually Appeared] Wounding Event: [1:Venous Leg Ulcer] [2:Diabetic Wound/Ulcer of the Lower] [3:Venous Leg Ulcer] Primary Etiology: [1:Congestive Heart Failure, Coronary Congestive Heart Failure, Coronary Congestive Heart Failure, Coronary] [2:Extremity] Comorbid History: [1:Artery Disease, Hypertension, Peripheral Arterial Disease, Peripheral Peripheral Arterial Disease, Peripheral Peripheral Arterial Disease, Peripheral Venous Disease, Type II Diabetes 06/25/2020] [2:Artery Disease, Hypertension, Venous  Disease, Type II Diabetes 05/26/2020] [3:Artery Disease, Hypertension, Venous Disease, Type II Diabetes 06/25/2020] Date Acquired: [1:0] [2:0] [3:0] Weeks of Treatment: [1:Open] [2:Open] [3:Open] Wound Status: [1:12x13x0.1] [2:2.5x3.4x0.1] [3:15x13x0.2] Measurements L x W x D (cm) [1:122.522] [2:6.676] [3:153.153] A (  cm) : rea [1:12.252] [2:0.668] [3:30.631] Volume (cm) : [1:N/A] [2:0.00%] [3:N/A] % Reduction in Area: [1:N/A] [2:0.00%] [3:N/A] % Reduction in Volume: [1:Full Thickness Without Exposed] [2:Grade 2] [3:Full Thickness Without Exposed] Classification: [1:Support Structures Medium] [2:Medium] [3:Support Structures Medium] Exudate Amount: [1:Serosanguineous] [2:Serosanguineous] [3:Serosanguineous] Exudate Type: [1:red, brown] [2:red, brown] [3:red, brown] Exudate Color: [1:N/A] [2:Distinct, outline attached] [3:N/A] Wound Margin: [1:Small (1-33%)] [2:Medium (34-66%)] [3:Medium (34-66%)] Granulation Amount: [1:Red, Pink] [2:Pink] [3:Pink] Granulation Quality: [1:Large (67-100%)] [2:Medium (34-66%)] [3:Medium (34-66%)] Necrotic Amount: [1:Fat Layer (Subcutaneous Tissue): Yes Fat Layer (Subcutaneous Tissue): Yes Fat Layer (Subcutaneous Tissue): Yes] Exposed Structures: [1:Fascia: No Tendon: No Muscle: No Joint: No Bone: No None] [2:Fascia: No Tendon: No Muscle: No Joint: No Bone: No None] [3:Fascia: No Tendon: No Muscle: No Joint: No Bone: No  None] Epithelialization: Treatment Notes Electronic Signature(s) Signed: 09/14/2020 8:08:43 AM By: Linton Ham MD Signed: 09/14/2020 1:33:30 PM By: Kela Millin Entered By: Linton Ham on 09/11/2020 12:33:13 -------------------------------------------------------------------------------- Multi-Disciplinary Care Plan Details Patient Name: Date of Service: [name], [name] 09/11/2020 10:30 A M Medical Record Number: [id number] Patient Account Number: [id number] Date of Birth/Sex: Treating RN: July 18, 1928 (86 y.o. [name] Primary Care Felica Chargois: Dimas Chyle Other Clinician: Referring Boris Engelmann: Treating Dulcie Gammon/Extender: Donalee Citrin in Treatment: 0 Active Inactive Pain, Acute or Chronic Nursing Diagnoses: Pain, acute or chronic: actual or potential Goals: Patient/caregiver will verbalize adequate pain control between visits Date Initiated: 09/11/2020 Target Resolution Date: 10/16/2020 Goal Status: Active Interventions: Provide education on pain management Notes: Venous Leg Ulcer Nursing Diagnoses: Actual venous Insuffiency (use after diagnosis is confirmed) Goals: Patient/caregiver will verbalize understanding of disease process and disease management Date Initiated: 09/11/2020 Target Resolution Date: 10/16/2020 Goal Status: Active Interventions: Compression as ordered Notes: Wound/Skin Impairment Nursing Diagnoses: Impaired tissue integrity Goals: Ulcer/skin breakdown will have a volume reduction of 50% by week 8 Date Initiated: 09/11/2020 Target Resolution Date: 10/16/2020 Goal Status: Active Interventions: Provide education on ulcer and skin care Notes: Electronic Signature(s) Signed: 09/14/2020 1:33:30 PM By: Kela Millin Signed: 09/14/2020 1:33:30 PM By: Kela Millin Entered By: Kela Millin on 09/11/2020  11:58:13 -------------------------------------------------------------------------------- Pain Assessment Details Patient Name: Date of Service: [name], [name] 09/11/2020 10:30 A M Medical Record Number: [id number] Patient Account Number: [id number] Date of Birth/Sex: Treating RN: September 10, 1928 (86 y.o. Oval Linsey Primary Care Noriko Macari: Dimas Chyle Other Clinician: Referring Liron Eissler: Treating Stephen Turnbaugh/Extender: Donalee Citrin in Treatment: 0 Active Problems Location of Pain Severity and Description of Pain Patient Has Paino Yes Site Locations With Dressing Change: Yes Duration of the Pain. Constant / Intermittento Intermittent How Long Does it Lasto Hours: 1 Minutes: Rate the pain. Current Pain Level: 4 Worst Pain Level: 7 Least Pain Level: 0 Tolerable Pain Level: 5 Character of Pain Describe the Pain: Aching, Burning, Throbbing Pain Management and Medication Current Pain Management: Medication: Yes Cold Application: No Rest: Yes Massage: No Activity: No T.E.N.S.: No Heat Application: No Leg drop or elevation: No Is the Current Pain Management Adequate: Inadequate How does your wound impact your activities of daily livingo Sleep: No Bathing: No Appetite: No Relationship With Others: No Bladder Continence: No Emotions: No Bowel Continence: No Work: No Toileting: No Drive: No Dressing: No Hobbies: No Electronic Signature(s) Signed: 09/14/2020 1:15:48 PM By: Carlene Coria RN Entered By: Carlene Coria on 09/11/2020 11:37:34 -------------------------------------------------------------------------------- Patient/Caregiver Education Details Patient Name: Date of Service: [name], [name] 9/17/2021andnbsp10:30 [place] Record Number: [id number] Patient Account Number: [id number] Date of  Birth/Gender: Treating RN: [birth year] (86 y.o. [name] Primary Care Physician: Dimas Chyle Other Clinician: Referring  Physician: Treating Physician/Extender: Donalee Citrin in Treatment: 0 Education Assessment Education Provided To: Patient Education Topics Provided Pain: Handouts: A Guide to Pain Control Methods: Explain/Verbal Responses: State content correctly Wound/Skin Impairment: Handouts: Caring for Your Ulcer Methods: Explain/Verbal Responses: State content correctly Electronic Signature(s) Signed: 09/14/2020 1:33:30 PM By: Kela Millin Entered By: Kela Millin on 09/11/2020 11:58:27 -------------------------------------------------------------------------------- Wound Assessment Details Patient Name: Date of Service: [name], [name] 09/11/2020 10:30 A M Medical Record Number: [id number] Patient Account Number: [id number] Date of Birth/Sex: Treating RN: Feb 16, 1928 (86 y.o. Jerilynn Mages) Carlene Coria Primary Care Meyah Corle: Dimas Chyle Other Clinician: Referring Toure Edmonds: Treating Amiri Riechers/Extender: Donalee Citrin in Treatment: 0 Wound Status Wound Number: 1 Primary Venous Leg Ulcer Etiology: Wound Location: Anterior Lower Leg Wound Open Wounding Event: Gradually Appeared Status: Date Acquired: 06/25/2020 Comorbid Congestive Heart Failure, Coronary Artery Disease, Hypertension, Weeks Of Treatment: 0 History: Peripheral Arterial Disease, Peripheral Venous Disease, Type II Clustered Wound: No Diabetes Wound Measurements Length: (cm) 12 Width: (cm) 13 Depth: (cm) 0.1 Area: (cm) 122.522 Volume: (cm) 12.252 % Reduction in Area: % Reduction in Volume: Epithelialization: None Tunneling: No Undermining: No Wound Description Classification: Full Thickness Without Exposed Support Structures Exudate Amount: Medium Exudate Type: Serosanguineous Exudate Color: red, brown Foul Odor After Cleansing: No Slough/Fibrino Yes Wound Bed Granulation Amount: Small (1-33%) Exposed Structure Granulation Quality: Red, Pink Fascia Exposed:  No Necrotic Amount: Large (67-100%) Fat Layer (Subcutaneous Tissue) Exposed: Yes Necrotic Quality: Adherent Slough Tendon Exposed: No Muscle Exposed: No Joint Exposed: No Bone Exposed: No Treatment Notes Wound #1 (Anterior Lower Leg) 1. Cleanse With Wound Cleanser Soap and water 2. Periwound Care Moisturizing lotion TCA Cream 3. Primary Dressing Applied Calcium Alginate Ag 4. Secondary Dressing ABD Pad Kerramax/Xtrasorb 6. Support Layer Applied Kerlix/Coban Notes netting. explained the orders, frequency of change, when to return to wound center. patient and wife in agreement. Electronic Signature(s) Signed: 09/14/2020 1:15:48 PM By: Carlene Coria RN Entered By: Carlene Coria on 09/11/2020 11:34:25 -------------------------------------------------------------------------------- Wound Assessment Details Patient Name: Date of Service: [name], [name] 09/11/2020 10:30 A M Medical Record Number: [id number] Patient Account Number: [id number] Date of Birth/Sex: Treating RN: 1928-05-26 (86 y.o. Jerilynn Mages) Carlene Coria Primary Care Jamire Shabazz: Dimas Chyle Other Clinician: Referring Kalaya Infantino: Treating Syrai Gladwin/Extender: Donalee Citrin in Treatment: 0 Wound Status Wound Number: 2 Primary Diabetic Wound/Ulcer of the Lower Extremity Etiology: Wound Location: Left Calcaneus Wound Open Wounding Event: Gradually Appeared Status: Date Acquired: 05/26/2020 Comorbid Congestive Heart Failure, Coronary Artery Disease, Hypertension, Weeks Of Treatment: 0 History: Peripheral Arterial Disease, Peripheral Venous Disease, Type II Clustered Wound: No Diabetes Wound Measurements Length: (cm) 2.5 Width: (cm) 3.4 Depth: (cm) 0.1 Area: (cm) 6.676 Volume: (cm) 0.668 % Reduction in Area: 0% % Reduction in Volume: 0% Epithelialization: None Tunneling: No Undermining: No Wound Description Classification: Grade 2 Wound Margin: Distinct, outline attached Exudate Amount:  Medium Exudate Type: Serosanguineous Exudate Color: red, brown Foul Odor After Cleansing: No Slough/Fibrino Yes Wound Bed Granulation Amount: Medium (34-66%) Exposed Structure Granulation Quality: Pink Fascia Exposed: No Necrotic Amount: Medium (34-66%) Fat Layer (Subcutaneous Tissue) Exposed: Yes Necrotic Quality: Adherent Slough Tendon Exposed: No Muscle Exposed: No Joint Exposed: No Bone Exposed: No Treatment Notes Wound #2 (Left Calcaneus) 1. Cleanse With Wound Cleanser Soap and water 2. Periwound Care Moisturizing lotion TCA Cream 3. Primary Dressing Applied Iodoflex 4. Secondary Dressing Dry  Gauze Heel Cup 6. Support Layer Applied Kerlix/Coban Notes netting. Electronic Signature(s) Signed: 09/14/2020 1:15:48 PM By: Carlene Coria RN Signed: 09/14/2020 1:33:30 PM By: Kela Millin Entered By: Kela Millin on 09/11/2020 11:55:09 -------------------------------------------------------------------------------- Wound Assessment Details Patient Name: Date of Service: [name], [name] 09/11/2020 10:30 A M Medical Record Number: [id number] Patient Account Number: [id number] Date of Birth/Sex: Treating RN: 08-29-1928 (86 y.o. Jerilynn Mages) Carlene Coria Primary Care Liddy Deam: Dimas Chyle Other Clinician: Referring Quenten Nawaz: Treating Juanda Luba/Extender: Donalee Citrin in Treatment: 0 Wound Status Wound Number: 3 Primary Venous Leg Ulcer Etiology: Wound Location: Right Lower Leg Wound Open Wounding Event: Gradually Appeared Status: Date Acquired: 06/25/2020 Comorbid Congestive Heart Failure, Coronary Artery Disease, Hypertension, Weeks Of Treatment: 0 History: Peripheral Arterial Disease, Peripheral Venous Disease, Type II Clustered Wound: No Diabetes Wound Measurements Length: (cm) 15 Width: (cm) 13 Depth: (cm) 0.2 Area: (cm) 153.153 Volume: (cm) 30.631 % Reduction in Area: % Reduction in Volume: Epithelialization: None Tunneling:  No Undermining: No Wound Description Classification: Full Thickness Without Exposed Support St Exudate Amount: Medium Exudate Type: Serosanguineous Exudate Color: red, brown ructures Foul Odor After Cleansing: No Slough/Fibrino Yes Wound Bed Granulation Amount: Medium (34-66%) Exposed Structure Granulation Quality: Pink Fascia Exposed: No Necrotic Amount: Medium (34-66%) Fat Layer (Subcutaneous Tissue) Exposed: Yes Necrotic Quality: Adherent Slough Tendon Exposed: No Muscle Exposed: No Joint Exposed: No Bone Exposed: No Treatment Notes Wound #3 (Right Lower Leg) 1. Cleanse With Wound Cleanser Soap and water 2. Periwound Care Moisturizing lotion TCA Cream 3. Primary Dressing Applied Calcium Alginate Ag 4. Secondary Dressing ABD Pad Kerramax/Xtrasorb 6. Support Layer Applied Kerlix/Coban Notes netting. explained the orders, frequency of change, when to return to wound center. patient and wife in agreement. Electronic Signature(s) Signed: 09/14/2020 1:15:48 PM By: Carlene Coria RN Entered By: Carlene Coria on 09/11/2020 11:36:39 -------------------------------------------------------------------------------- Vitals Details Patient Name: Date of Service: [name], [name] 09/11/2020 10:30 A M Medical Record Number: [id number] Patient Account Number: [id number] Date of Birth/Sex: Treating RN: Mar 12, 1928 (86 y.o. Jerilynn Mages) Carlene Coria Primary Care Billee Balcerzak: Dimas Chyle Other Clinician: Referring Keyondra Lagrand: Treating Janique Hoefer/Extender: Donalee Citrin in Treatment: 0 Vital Signs Time Taken: 11:02 Temperature (F): 98 Height (in): 67 Pulse (bpm): 77 Source: Stated Respiratory Rate (breaths/min): 18 Weight (lbs): 185 Blood Pressure (mmHg): 104/55 Source: Stated Reference Range: 80 - 120 mg / dl Body Mass Index (BMI): 29 Electronic Signature(s) Signed: 09/14/2020 1:15:48 PM By: Carlene Coria RN Entered By: Carlene Coria on 09/11/2020 11:03:33

## 2020-09-14 NOTE — Telephone Encounter (Signed)
FYI

## 2020-09-14 NOTE — Telephone Encounter (Signed)
Ok with me. Please place any necessary orders. 

## 2020-09-14 NOTE — Progress Notes (Signed)
CHIJIOKE, LASSER ([id number]) Visit Report for 09/11/2020 Abuse/Suicide Risk Screen Details Patient Name: Date of Service: Andrew [name], [name] 09/11/2020 10:30 A M Medical Record Number: [id number] Patient Account Number: [id number] Date of Birth/Sex: Treating RN: 12/09/1928 (86 y.o. Jerilynn Mages) Carlene Coria Primary Care Serai Tukes: Dimas Chyle Other Clinician: Referring Shenita Trego: Treating Theora Vankirk/Extender: Donalee Citrin in Treatment: 0 Abuse/Suicide Risk Screen Items Answer ABUSE RISK SCREEN: Has anyone close to you tried to hurt or harm you recentlyo No Do you feel uncomfortable with anyone in your familyo No Has anyone forced you do things that you didnt want to doo No Electronic Signature(s) Signed: 09/14/2020 1:15:48 PM By: Carlene Coria RN Entered By: Carlene Coria on 09/11/2020 11:13:06 -------------------------------------------------------------------------------- Activities of Daily Living Details Patient Name: Date of Service: Andrew [name], [name] 09/11/2020 10:30 A M Medical Record Number: [id number] Patient Account Number: [id number] Date of Birth/Sex: Treating RN: [birth year] (86 y.o. Jerilynn Mages) Carlene Coria Primary Care Nicoles Sedlacek: Dimas Chyle Other Clinician: Referring Lajoy Vanamburg: Treating Kieffer Blatz/Extender: Donalee Citrin in Treatment: 0 Activities of Daily Living Items Answer Activities of Daily Living (Please select one for each item) Drive Automobile Not Able T Medications ake Need Assistance Use T elephone Completely Able Care for Appearance Completely Able Use T oilet Completely Able Bath / Shower Completely Able Dress Self Completely Able Feed Self Completely Able Walk Need Assistance Get In / Out Bed Completely Able Housework Not Able Prepare Meals Not Able Handle Money Need Assistance Shop for Self Not Able Electronic Signature(s) Signed: 09/14/2020 1:15:48 PM By: Carlene Coria RN Entered By: Carlene Coria on 09/11/2020  11:14:00 -------------------------------------------------------------------------------- Education Screening Details Patient Name: Date of Service: Andrew [name], [name] 09/11/2020 10:30 A M Medical Record Number: [id number] Patient Account Number: [id number] Date of Birth/Sex: Treating RN: 1928/05/06 (86 y.o. Jerilynn Mages) Carlene Coria Primary Care Anise Harbin: Dimas Chyle Other Clinician: Referring Paxton Kanaan: Treating Garland Smouse/Extender: Donalee Citrin in Treatment: 0 Primary Learner Assessed: Patient Learning Preferences/Education Level/Primary Language Learning Preference: Explanation Highest Education Level: College or Above Preferred Language: English Cognitive Barrier Language Barrier: No Translator Needed: No Memory Deficit: No Emotional Barrier: No Cultural/Religious Beliefs Affecting Medical Care: No Physical Barrier Impaired Vision: No Impaired Hearing: Yes Decreased Hand dexterity: No Knowledge/Comprehension Knowledge Level: Medium Comprehension Level: High Ability to understand written instructions: High Ability to understand verbal instructions: High Motivation Anxiety Level: Anxious Cooperation: Cooperative Education Importance: Acknowledges Need Interest in Health Problems: Asks Questions Perception: Coherent Willingness to Engage in Self-Management High Activities: Readiness to Engage in Self-Management High Activities: Electronic Signature(s) Signed: 09/14/2020 1:15:48 PM By: Carlene Coria RN Entered By: Carlene Coria on 09/11/2020 11:15:01 -------------------------------------------------------------------------------- Fall Risk Assessment Details Patient Name: Date of Service: Andrew [name], [name] 09/11/2020 10:30 A M Medical Record Number: [id number] Patient Account Number: [id number] Date of Birth/Sex: Treating RN: July 19, 1928 (86 y.o. Jerilynn Mages) Carlene Coria Primary Care Rockelle Heuerman: Dimas Chyle Other Clinician: Referring Bary Limbach: Treating  Khali Perella/Extender: Donalee Citrin in Treatment: 0 Fall Risk Assessment Items Have you had 2 or more falls in the last 12 monthso 0 No Have you had any fall that resulted in injury in the last 12 monthso 0 No FALLS RISK SCREEN History of falling - immediate or within 3 months 0 No Secondary diagnosis (Do you have 2 or more medical diagnoseso) 0 No Ambulatory aid None/bed rest/wheelchair/nurse 0 No Crutches/cane/walker 0 No Furniture 0 No Intravenous therapy Access/Saline/Heparin Lock 0 No Gait/Transferring Normal/ bed rest/ wheelchair 0 No Weak (  short steps with or without shuffle, stooped but able to lift head while walking, may seek 0 No support from furniture) Impaired (short steps with shuffle, may have difficulty arising from chair, head down, impaired 0 No balance) Mental Status Oriented to own ability 0 No Electronic Signature(s) Signed: 09/14/2020 1:15:48 PM By: Carlene Coria RN Entered By: Carlene Coria on 09/11/2020 11:15:15 -------------------------------------------------------------------------------- Foot Assessment Details Patient Name: Date of Service: Andrew [name], [name] 09/11/2020 10:30 A M Medical Record Number: [id number] Patient Account Number: [id number] Date of Birth/Sex: Treating RN: 24-Oct-1928 (86 y.o. Jerilynn Mages) Carlene Coria Primary Care Maeli Spacek: Dimas Chyle Other Clinician: Referring Elliot Simoneaux: Treating Natalio Salois/Extender: Donalee Citrin in Treatment: 0 Foot Assessment Items Site Locations + = Sensation present, - = Sensation absent, C = Callus, U = Ulcer R = Redness, W = Warmth, M = Maceration, PU = Pre-ulcerative lesion F = Fissure, S = Swelling, D = Dryness Assessment Right: Left: Other Deformity: No No Prior Foot Ulcer: No No Prior Amputation: No No Charcot Joint: No No Ambulatory Status: Ambulatory Without Help Gait: Steady Electronic Signature(s) Signed: 09/14/2020 1:15:48 PM By: Carlene Coria  RN Entered By: Carlene Coria on 09/11/2020 11:31:56 -------------------------------------------------------------------------------- Nutrition Risk Screening Details Patient Name: Date of Service: Andrew [name], [name] 09/11/2020 10:30 A M Medical Record Number: [id number] Patient Account Number: [id number] Date of Birth/Sex: Treating RN: 1928-05-15 (86 y.o. Jerilynn Mages) Carlene Coria Primary Care Aveon Colquhoun: Dimas Chyle Other Clinician: Referring Baraka Klatt: Treating Rasheen Bells/Extender: Donalee Citrin in Treatment: 0 Height (in): 67 Weight (lbs): 185 Body Mass Index (BMI): 29 Nutrition Risk Screening Items Score Screening NUTRITION RISK SCREEN: I have an illness or condition that made me change the kind and/or amount of food I eat 0 No I eat fewer than two meals per day 0 No I eat few fruits and vegetables, or milk products 0 No I have three or more drinks of beer, liquor or wine almost every day 0 No I have tooth or mouth problems that make it hard for me to eat 0 No I don't always have enough money to buy the food I need 0 No I eat alone most of the time 0 No I take three or more different prescribed or over-the-counter drugs a day 1 Yes Without wanting to, I have lost or gained 10 pounds in the last six months 0 No I am not always physically able to shop, cook and/or feed myself 2 Yes Nutrition Protocols Good Risk Protocol Moderate Risk Protocol 0 Provide education on nutrition High Risk Proctocol Risk Level: Moderate Risk Score: 3 Electronic Signature(s) Signed: 09/14/2020 1:15:48 PM By: Carlene Coria RN Entered By: Carlene Coria on 09/11/2020 11:15:51

## 2020-09-15 ENCOUNTER — Encounter (HOSPITAL_COMMUNITY): Payer: Self-pay

## 2020-09-15 ENCOUNTER — Other Ambulatory Visit: Payer: Self-pay

## 2020-09-15 ENCOUNTER — Ambulatory Visit (HOSPITAL_BASED_OUTPATIENT_CLINIC_OR_DEPARTMENT_OTHER)
Admission: RE | Admit: 2020-09-15 | Discharge: 2020-09-15 | Disposition: A | Discharge status: Home / Self Care | Payer: Medicare PPO | Source: Ambulatory Visit

## 2020-09-15 ENCOUNTER — Ambulatory Visit (HOSPITAL_COMMUNITY)
Admission: RE | Admit: 2020-09-15 | Discharge: 2020-09-15 | Disposition: A | Discharge status: Home / Self Care | Payer: Medicare PPO | Source: Ambulatory Visit | Attending: Cardiology | Admitting: Cardiology

## 2020-09-15 VITALS — BP 110/78 | HR 66 | Ht 67.0 in | Wt 181.2 lb

## 2020-09-15 DIAGNOSIS — N183 Chronic kidney disease, stage 3 unspecified: Secondary | ICD-10-CM | POA: Insufficient documentation

## 2020-09-15 DIAGNOSIS — E039 Hypothyroidism, unspecified: Secondary | ICD-10-CM | POA: Diagnosis not present

## 2020-09-15 DIAGNOSIS — Z7901 Long term (current) use of anticoagulants: Secondary | ICD-10-CM | POA: Insufficient documentation

## 2020-09-15 DIAGNOSIS — I739 Peripheral vascular disease, unspecified: Secondary | ICD-10-CM | POA: Insufficient documentation

## 2020-09-15 DIAGNOSIS — R911 Solitary pulmonary nodule: Secondary | ICD-10-CM | POA: Insufficient documentation

## 2020-09-15 DIAGNOSIS — R296 Repeated falls: Secondary | ICD-10-CM | POA: Diagnosis not present

## 2020-09-15 DIAGNOSIS — Z9181 History of falling: Secondary | ICD-10-CM | POA: Diagnosis not present

## 2020-09-15 DIAGNOSIS — Z79899 Other long term (current) drug therapy: Secondary | ICD-10-CM | POA: Diagnosis not present

## 2020-09-15 DIAGNOSIS — J449 Chronic obstructive pulmonary disease, unspecified: Secondary | ICD-10-CM | POA: Diagnosis not present

## 2020-09-15 DIAGNOSIS — N1832 Chronic kidney disease, stage 3b: Secondary | ICD-10-CM

## 2020-09-15 DIAGNOSIS — I5022 Chronic systolic (congestive) heart failure: Secondary | ICD-10-CM

## 2020-09-15 DIAGNOSIS — Z79890 Hormone replacement therapy (postmenopausal): Secondary | ICD-10-CM | POA: Insufficient documentation

## 2020-09-15 DIAGNOSIS — Z9989 Dependence on other enabling machines and devices: Secondary | ICD-10-CM | POA: Insufficient documentation

## 2020-09-15 DIAGNOSIS — I69393 Ataxia following cerebral infarction: Secondary | ICD-10-CM | POA: Insufficient documentation

## 2020-09-15 DIAGNOSIS — E1122 Type 2 diabetes mellitus with diabetic chronic kidney disease: Secondary | ICD-10-CM | POA: Insufficient documentation

## 2020-09-15 DIAGNOSIS — I480 Paroxysmal atrial fibrillation: Secondary | ICD-10-CM | POA: Diagnosis not present

## 2020-09-15 DIAGNOSIS — K219 Gastro-esophageal reflux disease without esophagitis: Secondary | ICD-10-CM | POA: Insufficient documentation

## 2020-09-15 DIAGNOSIS — I6529 Occlusion and stenosis of unspecified carotid artery: Secondary | ICD-10-CM | POA: Insufficient documentation

## 2020-09-15 DIAGNOSIS — G609 Hereditary and idiopathic neuropathy, unspecified: Secondary | ICD-10-CM | POA: Diagnosis not present

## 2020-09-15 DIAGNOSIS — Z951 Presence of aortocoronary bypass graft: Secondary | ICD-10-CM | POA: Diagnosis not present

## 2020-09-15 DIAGNOSIS — I5042 Chronic combined systolic (congestive) and diastolic (congestive) heart failure: Secondary | ICD-10-CM | POA: Diagnosis not present

## 2020-09-15 DIAGNOSIS — I255 Ischemic cardiomyopathy: Secondary | ICD-10-CM | POA: Diagnosis not present

## 2020-09-15 DIAGNOSIS — E785 Hyperlipidemia, unspecified: Secondary | ICD-10-CM | POA: Insufficient documentation

## 2020-09-15 DIAGNOSIS — I130 Hypertensive heart and chronic kidney disease with heart failure and stage 1 through stage 4 chronic kidney disease, or unspecified chronic kidney disease: Secondary | ICD-10-CM | POA: Insufficient documentation

## 2020-09-15 DIAGNOSIS — W19XXXA Unspecified fall, initial encounter: Secondary | ICD-10-CM | POA: Diagnosis not present

## 2020-09-15 DIAGNOSIS — I25810 Atherosclerosis of coronary artery bypass graft(s) without angina pectoris: Secondary | ICD-10-CM | POA: Insufficient documentation

## 2020-09-15 LAB — ECHOCARDIOGRAM COMPLETE
Area-P 1/2: 3.65 cm2
Calc EF: 54.8 %
S' Lateral: 4.6 cm
Single Plane A2C EF: 58.3 %
Single Plane A4C EF: 51.2 %

## 2020-09-15 LAB — BASIC METABOLIC PANEL
Anion gap: 10 (ref 5–15)
BUN: 41 mg/dL — ABNORMAL HIGH (ref 8–23)
CO2: 31 mmol/L (ref 22–32)
Calcium: 8.9 mg/dL (ref 8.9–10.3)
Chloride: 100 mmol/L (ref 98–111)
Creatinine, Ser: 2.08 mg/dL — ABNORMAL HIGH (ref 0.61–1.24)
GFR calc Af Amer: 32 mL/min — ABNORMAL LOW (ref >60)
GFR calc non Af Amer: 27 mL/min — ABNORMAL LOW (ref >60)
Glucose, Bld: 149 mg/dL — ABNORMAL HIGH (ref 70–99)
Potassium: 3.9 mmol/L (ref 3.5–5.1)
Sodium: 141 mmol/L (ref 135–145)

## 2020-09-15 NOTE — Progress Notes (Signed)
ReDS Vest / Clip - 09/15/20 1527      ReDS Vest / Clip   Station Marker D    Ruler Value 36    ReDS Value Range Low volume    ReDS Actual Value 23    Anatomical Comments sitting

## 2020-09-15 NOTE — Telephone Encounter (Signed)
VO given to [place] OT

## 2020-09-15 NOTE — Progress Notes (Signed)
Date:  09/15/2020   ID:  [name], DOB September 26, 1928, MRN [id number]  Provider location: [facility] Type of Visit: Established patient   PCP:  [name], MD  Cardiologist:  Dr. [name]   History of Present Illness: Andrew [name] is a 86 y.o. male who has a history of CKD, CAD s/p CABG, and ischemic cardiomyopathy with primarily diastolic CHF.  He has PAD followed at [facility].  Had [place] in 11/15. 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  He had had occlusion of a relatively small LAD, there were right to left collaterals and no intervention was done (medical management).    He and his wife went on a cruise along the [employer] in 5/16.  He admits to considerable dietary indiscretion (high sodium diet). It appears that he developed a hypertensive crisis along with chest pain while on the ship. He went into atrial fibrillation and developed CHF.  He was taken to the hospital in [place], [place].  He was in the ICU for about a week on Bipap intermittently.  Troponin peaked at 0.09 during this admission.  He was diuresed and after about 2 wks left the hospital and was able to fly home.    Cardiolite (6/16) showed EF 42%, prior anterolateral MI, no ischemia. Echo in 10/16 with EF 40-45%.    He was admitted on 07/06/15 for RLQ pain and fever.  He was treated for UTI and had a kidney stone as well.  He developed SOB after IVF were given for AKI.  He was treated for acute COPD exacerbation as well as CHF decompensation.  He was diuresed with IV lasix.  His weight on admission was 179 and got as high as 188 after IVF. Discharge weight was 181.  He was admitted in 10/16 with a cerebellar CVA.  He has some resultant imbalance.   He was again admitted in 11/16 with acute on chronic systolic CHF.  This was a short admission that appeared to be precipitated by a sodium load from eating at a [employer] .   3/18 Echo showed stable EF 40-45%.  He was also  having chest pain so I did a Cardiolite in 3/18, no ischemia.   He was admitted in 11/18 for elective hernia repair. Post-op, he developed hypoxemia and had a rehab stay.  He is now off oxygen.   Creatinine increased to 2.97 in 1/19.  I cut back on losartan and Lasix.  He developed increased lower extremity edema and we increased Lasix back to 120 qam/80 qpm.  He had a mechanical fall in 7/20 (tripped). He hit his head but refused to go to the ER.  Later in 7/20, he was admitted with fever and started on antibiotics with concern for PNA, but CT chest did not show definite PNA.  ?Viral syndrome.  COVID-19 was negative.   Echo in 8/20 showed  EF 45-50% with anterolateral hypokinesis, normal RV.   He was admitted in 6/21 with aspiration PNA and COPD exacerbation, sent to [facility] for rehab afterwards.   Patient developed a left heel ulcer. In 8/21, he had peripheral angiogram by Dr. Donzetta Matters showing multiple left SFA stenoses > 50% but no occlusion. The right SFA was occluded with recanalization.  No interventional option.   Today he returns for HF follow up with his wife. Last week he saw Dr [name] and was switched from lasix to torsemide. Says he falls all the  time. Had a mechanical fall this morning and had EMS pick him up. Has some shortness of breath with exertion. Denies PND/Orthopnea. He tries to use his rolling walker at home. Appetite ok. No fever or chills.  Taking all medications. Followed by HF Paramedicine. Followed at the [facility] for lower extremity wounds.   Labs (12/14): K 4.8, creatinine 1.5 Labs (5/15): LDL 92 Labs (11/15): K 4.1, creatinine 1.5 Labs (12/15): K 4, creatinine 1.5 Labs (1/16): LFTs normal, LDL 51, HDL 47 Labs (5/16): TnI 0.09 Labs (6/16): K 4.8 => 4.4, creatinine 1.79 => 2.26, HCT 28.7 Labs (7/16) K 4.2, creatinine 1.98, LFTs normal, TSH normal Labs (8/16): HCT 27.1 Labs (9/16): hgb 8.1, K 4.3, creatinine 1.8, LFTs normal, TSH elevated Labs (10/16): LDL  74, HDL 45, TSH 5.19 (increased), free T3 and free T4 normal.  Labs (11/16): K 3.5, creatinine 1.47, AST 84, ALT 89 Labs (12/16): K 4.3, creatinine 1.58, AST 43, ALT 42 Labs (3/17): K 4.2, creatinine 1.4, hgb 10.7, AST 36, ALT 45 Labs (6/17): K 4, creatinine 1.69, hgb 10.9, proBNP 389, TSH mildly elevated Labs (8/17): Free T4 and T3 normal, TSH mildly elevated, K 4.2, creatinine 1.7, LFTs normal, hgb 10.4 Labs (12/17): TSH elevated but free T3 and T4 normal, K 4.5, creatinine 1.9, LDL 45, HDL 39, LFTs normal, HCT 32.9 Labs (1/18): K 4.4, creatinine 1.93 Labs (3/18): K 4.3, creatinine 1.87, BNP 187, hgb 11.1, LDL 75, LFTs normal Labs (4/18): K 4.1, creatinine 1.7 Labs (9/18): K 4, creatinine 1.8, hgb 11.9 Labs (10/18): free T4 0.4, free T3 1.9, TSH 139  Labs (12/18): K 3.4, creatinine 1.76, hgb 12.2 Labs (1/19): K 4 => 4.9, creatinine 2.7 => 2.97, TSH 179, LFTs normal Labs (2/19): K 4.6, creatinine 2.19 Labs (4/19): LDL 61, HDL 36, K 3.9, creatinine 1.77, LFTs normal  Labs (6/19): hgb 11.7, K 3.8, creatinine 2.09, TSH 10 Labs (9/19): K 3.9, creatinine 1.69 Labs (12/19): K 3.5, creatinine 1.69, LDL 75, HDL 39, TSH normal Labs (7/20): K 3.8, creatinine 1.69 Labs (8/20): K 4, creatinine 1.63, hgb 12.1 Labs (12/20): K 3.9, creatinine 2.05, AST 44, ALT 58, hgb 12.9, TSH normal Labs (2/21): K 4, creatinine 1.99, hgb 12.5, plts 133, LDL 58, HDL 34, AST 72, ALT 87 Labs (8/21): K 3.9, creatinine 1.76  PMH: 1. CKD stage 3 2. HTN 3. Hyperlipidemia 4. AAA: s/p surgery with left renal artery bypass in 1997 - Abdominal US 10/18 with 3.2 AAA 5. GERD 6. IBS 7. Melanoma s/p resection 8. Diabetes: Diet-controlled 9. Carotid stenosis: Carotid dopplers (4/15) with 40-59% bilateral stenosis. Carotid dopplers (7/16) with < 40% BICA stenosis.  Carotid dopplers (10/16) with 40-59% BICA stenosis.  - Carotids (8/17) with minimal disease.  - Carotids (10/18) with 1-39% BICA stenosis.  - Carotids (1/19):  1-39% BICA stenosis 10. PAD: 9/14 ABIs 0.91 right 1.09 left.  7/16 ABIs 0.86 right, 1.1 left; aortoiliac duplex with < 50% bilateral iliac stenosis.  - Aortoiliac duplex (8/17) with < 50% bilateral iliac stenosis.   - ABIs (10/17): left 0.91, right 0.98, left TBI 0.44 (abnormal).  - ABIs (7/18): right 0.97, right 0.75 - ABIs (1/19): right 1.04, left 1.03 - ABIs (7/19): right 1.13, noncompressible left ABI but TBI 0.93 - Peripheral angiogram (8/21): Multiple left SFA stenoses > 50% but no occlusion. The right SFA was occluded with recanalization.  No interventional option.  11. CAD: Has super-dominant RCA. s/p CABG 1992.  He had Taxus DES to mid PDA in 5/02.  LHC (1/05) with 90% ostial PDA (had DES to this vessel), totally occluded RIMA-PDA, totally occluded PLV, sequential SVG-PLV with 1 branch occluded.  LHC (11/15) with left main off the right cusp, 40-50% ostial left main, total occlusion of the proximal LAD (small vessel) with right to left collaterals, large super-dominant RCA with 50% ISR mid RCA, 50% ostial PDA, patent SVG-PLV, RIMA-PDA known atretic. Lexiscan Cardiolite (6/16) with EF 42%, prior anterolateral MI, no ischemia.  - Lexiscan Cardiolite (3/18): EF 51%, basal to mid anterolateral fixed defect with no ischemia.  12. Ischemic cardiomyopathy: Echo (12/09) with EF 45-50%, basal to mid inferolateral hypokinesis. Echo (6/15) with EF 45-50%, akinesis of the mid to apical inferolateral wall.  Echo (6/16) with EF 50-55%, mild LVH, inferior hypokinesis, mild MR.  Echo (10/16) with EF 40-45%, moderate LVH, mildly decreased RV systolic function.  - Echo (3/18) with EF 40-45%, mid anterior hypokinesis.  - Echo (2/19) with EF 60-65%, mild LVH, trade II diastolic dysfunction.  - Echo (8/20): EF 45-50%, anterolateral hypokinesis, normal RV size and systolic function.  13. OA 14. Atrial fibrillation/flutter: Paroxysmal.  Noted during 5/16 hospitalization in [place] and in 6/16.  He developed atrial  flutter in 8/16, back in NSR by 9/16.  15. Emphysema: PFTs (8/16) with FVC 88%, FEV1 74%, ratio 81%, TLC 83%, DLCO 31%. - PFTs (12/18): FVC 64%, FEV1 69%, ratio 105%, TLC 82%, DLCO 33% => mild obstruction, severely decreased DLCO (similar to prior).   16. Renal cysts 17. Idiopathic peripheral neuropathy.  18. Anemia.  21. 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  20. Hernia repair (11/18) 21. Hypothyroidism 22. Bronchiectasis: CT chest 1/19 with bronchiectasis.  - Aspiration PNA and COPD exacerbation in 6/21.  23. Lung nodule: 1.3 cm RML nodule on 1/19 CT, was noted in 2014 => slow growing. Possibly malignant.  Decision made with pulmonary to watch and avoid biopsy.  - CT chest (7/20) with stable RML nodule, no evidence for amiodarone toxicity.    Current Outpatient Medications  Medication Sig Dispense Refill  . acetaminophen (TYLENOL) 500 MG tablet Take 2 tablets (1,000 mg total) by mouth every 8 (eight) hours as needed. 30 tablet 0  . amiodarone (PACERONE) 200 MG tablet Take 100 mg by mouth daily.    Marland Kitchen apixaban (ELIQUIS) 2.5 MG TABS tablet Take 2.5 mg by mouth 2 (two) times daily.     Marland Kitchen atorvastatin (LIPITOR) 80 MG tablet Take 80 mg by mouth daily.    . carvedilol (COREG) 12.5 MG tablet Take 12.5 mg by mouth 2 (two) times daily with a meal.    . cilostazol (PLETAL) 100 MG tablet Take 1 tablet (100 mg total) by mouth 2 (two) times daily. 180 tablet 3  . ezetimibe (ZETIA) 10 MG tablet TAKE (1) TABLET DAILY. 90 tablet 0  . FLUoxetine (PROZAC) 20 MG capsule TAKE (1) CAPSULE DAILY. 90 capsule 0  . gabapentin (NEURONTIN) 100 MG capsule Take 100 mg by mouth 3 (three) times daily.    Marland Kitchen glucose blood (ACCU-CHEK AVIVA PLUS) test strip CHECK BLOOD SUGAR 4 TIMES A DAY. 100 each 11  . hydrALAZINE (APRESOLINE) 50 MG tablet Take 50 mg by mouth 3 (three) times daily.    Marland Kitchen HYDROcodone-acetaminophen (NORCO/VICODIN) 5-325 MG tablet TAKE 1 TABLET EVERY 6 HOURS AS NEEDED FOR MODERATE PAIN. 12 tablet 0  . isosorbide  mononitrate (IMDUR) 30 MG 24 hr tablet Take 30 mg by mouth daily.    Marland Kitchen levothyroxine (SYNTHROID) 100 MCG tablet Take 100 mcg by mouth daily.    Marland Kitchen  metolazone (ZAROXOLYN) 2.5 MG tablet Take 1 tablet (2.5 mg total) by mouth as directed. By HF Clinic 3 tablet 0  . Multiple Vitamins-Minerals (MULTIVITAMIN ADULTS 50+) TABS Take 1 tablet by mouth daily.    . nitroGLYCERIN (NITROSTAT) 0.4 MG SL tablet DISSOLVE 1 TABLET UNDER TONGUE AS NEEDED FOR CHEST PAIN,MAY REPEAT IN5 MINUTES FOR 2 DOSES. 25 tablet 3  . pantoprazole (PROTONIX) 40 MG tablet Take 1 tablet (40 mg total) by mouth 2 (two) times daily.    . potassium chloride SA (KLOR-CON) 20 MEQ tablet Take 20 mEq by mouth daily.    Marland Kitchen tiotropium (SPIRIVA) 18 MCG inhalation capsule Place 18 mcg into inhaler and inhale daily as needed (for wheezing).    . torsemide (DEMADEX) 20 MG tablet Take 4 tablets (80 mg total) by mouth 2 (two) times daily. 240 tablet 3   No current facility-administered medications for this encounter.    Allergies:   Metoprolol, Metformin, and Metformin and related   Social History:  The patient  reports that he has never smoked. He has never used smokeless tobacco. He reports that he does not drink alcohol and does not use drugs.   Family History:  The patient's family history includes Colon cancer in his father; Coronary artery disease in his brother; Diabetes in his brother and son; Heart disease in his brother; Hyperlipidemia in his brother and son; Peripheral vascular disease in his brother; Throat cancer in his brother.   ROS:  Please see the history of present illness.   All other systems are personally reviewed and negative.   Exam:   BP 110/78 (BP Location: Left Arm, Patient Position: Sitting, Cuff Size: Normal)   Pulse 66   Ht 5\' 7"  (1.702 m)   Wt 82.2 kg (181 lb 3.2 oz)   SpO2 94%   BMI 28.38 kg/m   ReDS Vest / Clip - 09/15/20 1527      ReDS Vest / Clip   Station Marker D    Ruler Value 36    ReDS Value Range  Low volume    ReDS Actual Value 23    Anatomical Comments sitting           General:  Arrived in a wheel chair.  No resp difficulty HEENT: Ecchymotic around left eye.  Neck: supple. no JVD. Carotids 2+ bilat; no bruits. No lymphadenopathy or thryomegaly appreciated. Cor: PMI nondisplaced. Regular rate & rhythm. No rubs, gallops or murmurs. Lungs: clear Abdomen: soft, nontender, nondistended. No hepatosplenomegaly. No bruits or masses. Good bowel sounds. Extremities: no cyanosis, clubbing, rash, R and LLE compression wraps in place.  Neuro: alert & orientedx3, cranial nerves grossly intact. moves all 4 extremities w/o difficulty. Affect pleasant .   Recent Labs: 06/03/2020: B Natriuretic Peptide 1,981.3 06/11/2020: Magnesium 2.0 09/08/2020: ALT 33; BUN 41; Creatinine, Ser 1.94; Hemoglobin 9.7; Platelets 175; Potassium 4.2; Sodium 140; TSH 1.456  Personally reviewed   Wt Readings from Last 3 Encounters:  09/15/20 82.2 kg (181 lb 3.2 oz)  09/10/20 82.6 kg (182 lb)  09/08/20 84.6 kg (186 lb 9.6 oz)      ASSESSMENT AND PLAN:  1. CAD: s/p CABG.  Cath in 11/15 showed occluded LAD with left to right collaterals.  The LAD was a relatively small vessel (super-dominant right).  Managed medically. Lexiscan Cardiolite in 6/16 with infarction but no ischemia.  Cardiolite 3/18 with infarction, no ischemia. No chest pain.  - Continue atorvastatin, he is not on ASA given use of Eliquis.  2.  Chronic primarily diastolic CHF/ischemic cardiomyopathy: EF 40-45% on 3/18 echo.  Echo 8/20 with EF 45-50%, normal RV.   - NYHA III. - Volume status stable. Continue torsemide 80 mg bid.  Clip Reading 23%. Check BMET  - Continue hydralazine 50 mg tid.    - Continue Imdur 30 mg daily.    - Continue Coreg 12.5 mg bid.  - Not on ACEi/ARB/spironolactone with elevated creatinine.    3. Carotid stenosis: Stable. Due for repeat carotid dopplers, needs to make followup at [facility].   4. PAD: Severe bilateral SFA disease  on 8/21 peripheral angiogram, no revascularization options.   - Continue cilostazol (severe claudication if he stops it, though not ideal with CHF).  - Now followed at the [facility] by Dr Dellia Nims.  5. Hyperlipidemia: Continue atorvastatin, check lipids today.  6. CKD III:  BMET today.  7. Atrial fibrillation/flutter:  Paroxysmal.   - Continue amiodarone 100 mg daily. Will need to get regular eye exams with amiodarone use.  CT chest in 7/20 did not appear to show evidence for amiodarone lung toxicity.  LFTs today.  He will need regular eye exam.   PCP follows TSH (hypothyroid).  - Continue Eliquis 2.5 bid.  8. Suspected OSA:  Has declined sleep study.   9. COPD: He is now off oxygen. He has bronchiectasis thought to be due to chronic aspiration.  Hospitalization in 6/21 with PNA and COPD exacerbation.  - Follows with pulmonary.  10. CVA: Cerebellar, 10/16.  He has had some resulting imbalance.  - He is no longer driving.  11. Hypertension: Stable.   12. Hypothyroidism: Likely related to amiodarone. - Followed by PCP.  13. Pulmonary nodule: 1.3 cm RML nodule on 1/19 CT chest.  It was also seen in 2014 => slow growing.  Dr Lake Bells has discussed with wife and they have decided not to go forward with biopsy.  14. Falls  Needs to use rolling walker when ambulating. Did not hit his head. Sound like a mechanical fall.   We will call ECHO results after reviewed by Dr [name].  Follow up in 4-6 weeks.   Jeanmarie Hubert, NP  09/15/2020  [facility] [address] [facility] [place] [address] [phone] (office) [phone] (fax)

## 2020-09-15 NOTE — Patient Instructions (Signed)
Labs done today, your results will be available in MyChart, we will contact you for abnormal readings.  .Please follow up with our provider in 4-6 weeks  If you have any questions or concerns before your next appointment please send [place] a message through [place] or call our office at [phone].    TO LEAVE A MESSAGE FOR THE NURSE SELECT OPTION 2, PLEASE LEAVE A MESSAGE INCLUDING: . YOUR NAME . DATE OF BIRTH . CALL BACK NUMBER . REASON FOR CALL**this is important as we prioritize the call backs  [place] AS LONG AS YOU CALL BEFORE 4:00 PM  At the [facility], you and your health needs are our priority. As part of our continuing mission to provide you with exceptional heart care, we have created designated Provider Care Teams. These Care Teams include your primary Cardiologist (physician) and Advanced Practice Providers (APPs- Physician Assistants and Nurse Practitioners) who all work together to provide you with the care you need, when you need it.   You may see any of the following providers on your designated Care Team at your next follow up: Marland Kitchen Dr Glori Bickers . Dr Loralie Champagne . Darrick Grinder, NP . Lyda Jester, PA . Audry Riles, PharmD   Please be sure to bring in all your medications bottles to every appointment.

## 2020-09-15 NOTE — Progress Notes (Signed)
2D echocardiogram completed.  09/15/2020 3:24 PM Kelby Aline., MHA, RVT, RDCS, RDMS

## 2020-09-16 DIAGNOSIS — I130 Hypertensive heart and chronic kidney disease with heart failure and stage 1 through stage 4 chronic kidney disease, or unspecified chronic kidney disease: Secondary | ICD-10-CM | POA: Diagnosis not present

## 2020-09-16 DIAGNOSIS — J9611 Chronic respiratory failure with hypoxia: Secondary | ICD-10-CM | POA: Diagnosis not present

## 2020-09-16 DIAGNOSIS — I5022 Chronic systolic (congestive) heart failure: Secondary | ICD-10-CM | POA: Diagnosis not present

## 2020-09-16 DIAGNOSIS — E1122 Type 2 diabetes mellitus with diabetic chronic kidney disease: Secondary | ICD-10-CM | POA: Diagnosis not present

## 2020-09-16 DIAGNOSIS — N184 Chronic kidney disease, stage 4 (severe): Secondary | ICD-10-CM | POA: Diagnosis not present

## 2020-09-16 DIAGNOSIS — L89620 Pressure ulcer of left heel, unstageable: Secondary | ICD-10-CM | POA: Diagnosis not present

## 2020-09-16 DIAGNOSIS — E1151 Type 2 diabetes mellitus with diabetic peripheral angiopathy without gangrene: Secondary | ICD-10-CM | POA: Diagnosis not present

## 2020-09-16 DIAGNOSIS — J441 Chronic obstructive pulmonary disease with (acute) exacerbation: Secondary | ICD-10-CM | POA: Diagnosis not present

## 2020-09-16 DIAGNOSIS — J9602 Acute respiratory failure with hypercapnia: Secondary | ICD-10-CM | POA: Diagnosis not present

## 2020-09-16 DIAGNOSIS — J449 Chronic obstructive pulmonary disease, unspecified: Secondary | ICD-10-CM | POA: Diagnosis not present

## 2020-09-16 DIAGNOSIS — J9601 Acute respiratory failure with hypoxia: Secondary | ICD-10-CM | POA: Diagnosis not present

## 2020-09-16 DIAGNOSIS — D631 Anemia in chronic kidney disease: Secondary | ICD-10-CM | POA: Diagnosis not present

## 2020-09-17 ENCOUNTER — Other Ambulatory Visit (HOSPITAL_COMMUNITY): Payer: Self-pay

## 2020-09-17 ENCOUNTER — Other Ambulatory Visit: Payer: Self-pay

## 2020-09-17 ENCOUNTER — Other Ambulatory Visit (HOSPITAL_COMMUNITY): Payer: Self-pay | Admitting: Cardiology

## 2020-09-17 ENCOUNTER — Other Ambulatory Visit: Payer: Self-pay | Admitting: Podiatry

## 2020-09-17 ENCOUNTER — Ambulatory Visit: Payer: Medicare PPO | Admitting: Physician Assistant

## 2020-09-17 ENCOUNTER — Ambulatory Visit (HOSPITAL_COMMUNITY)
Admission: RE | Admit: 2020-09-17 | Discharge: 2020-09-17 | Disposition: A | Discharge status: Home / Self Care | Payer: Medicare PPO | Source: Ambulatory Visit | Attending: Vascular Surgery | Admitting: Vascular Surgery

## 2020-09-17 VITALS — BP 129/69 | HR 70 | Temp 98.0°F | Resp 20 | Ht 67.0 in | Wt 185.0 lb

## 2020-09-17 DIAGNOSIS — I739 Peripheral vascular disease, unspecified: Secondary | ICD-10-CM | POA: Insufficient documentation

## 2020-09-17 NOTE — Progress Notes (Signed)
History of Present Illness:  Patient is a 86 y.o. year old male who presents for evaluation of non healing wounds to include left heel wound and right anterior shin.  S/P angiogram by Dr. Donzetta Matters 08/10/2020 reveled heavy calcification of vessel, but patent in the aorta, and iliac vessels.   Left lower extremity limited angiography demonstrates the SFA is heavily calcified throughout likely with multiple greater than 50% stenosis but never frankly occludes.  Given the heavy calcification that his wound is stable that he is not a candidate for endovascular repair Dr. Donzetta Matters elected not to intervene.  On the right side appears the SFA is occluded and reconstitutes above the knee.  Plan for continued wound care with OP wound center.  He is here today for repeat ABI's.    He reports continued pain, but it is tolerable for now.  He denise fever and chills.    Past Medical History:  Diagnosis Date  . AAA (abdominal aortic aneurysm) ([place])   . Anemia   . Arthritis   . CAD (coronary artery disease)   . Cancer ([place])    Melanoma - Back  . Carotid artery stenosis   . CHF (congestive heart failure) ([place])   . COPD (chronic obstructive pulmonary disease) ([place])   . Cyst of kidney, acquired   . Depression   . Diabetes mellitus   . DVT (deep venous thrombosis) ([place])   . Dysrhythmia   . Fatty liver 2008  . GERD (gastroesophageal reflux disease)   . Hyperlipidemia   . Hypertension   . IBS (irritable bowel syndrome)   . LVF (left ventricular failure) ([place])   . Paroxysmal atrial fibrillation ([facility])   . Pneumonia Jan. 2014  . Shortness of breath dyspnea   . Thyroid disease   . Typical atrial flutter ([place])   . UTI (urinary tract infection) 05/2015   While in [place]   . Vitamin D deficiency     Past Surgical History:  Procedure Laterality Date  . ABDOMINAL AORTIC ANEURYSM REPAIR  2002  . ABDOMINAL AORTOGRAM W/LOWER EXTREMITY Left 08/10/2020   Procedure: ABDOMINAL AORTOGRAM W/LOWER EXTREMITY;  Surgeon:  Waynetta Sandy, MD;  Location: [facility];  Service: Cardiovascular;  Laterality: Left;  . BIOPSY  06/10/2020   Procedure: BIOPSY;  Surgeon: Ronnette Juniper, MD;  Location: [facility];  Service: Gastroenterology;;  . CARDIAC CATHETERIZATION    . CORONARY ANGIOPLASTY    . CORONARY ANGIOPLASTY WITH STENT PLACEMENT  2005  . CORONARY ARTERY BYPASS GRAFT  1992  . ESOPHAGEAL BRUSHING  06/10/2020   Procedure: ESOPHAGEAL BRUSHING;  Surgeon: Ronnette Juniper, MD;  Location: [facility];  Service: Gastroenterology;;  . ESOPHAGOGASTRODUODENOSCOPY N/A 06/10/2020   Procedure: ESOPHAGOGASTRODUODENOSCOPY (EGD);  Surgeon: Ronnette Juniper, MD;  Location: [facility];  Service: Gastroenterology;  Laterality: N/A;  . EYE SURGERY     [place]  . HERNIA REPAIR    . INGUINAL HERNIA REPAIR Bilateral    w/mesh  . INSERTION OF MESH N/A 11/28/2017   Procedure: INSERTION OF MESH;  Surgeon: Donnie Mesa, MD;  Location: [facility];  Service: General;  Laterality: N/A;  GENERAL AND TAP BLOCK  . KNEE SURGERY Bilateral   . LAPAROSCOPIC INGUINAL HERNIA WITH UMBILICAL HERNIA Bilateral 11/28/2017   Procedure: LAPAROSCOPIC BILATERAL INGUINAL HERNIA REPAIR WITH MESH, UMBILICAL HERNIA REPAIR WITH MESH;  Surgeon: Donnie Mesa, MD;  Location: [facility];  Service: General;  Laterality: Bilateral;  GENERAL AND TAP BLOCK  . LEFT HEART CATHETERIZATION WITH CORONARY ANGIOGRAM  N/A 10/30/2014   Procedure: LEFT HEART CATHETERIZATION WITH CORONARY ANGIOGRAM;  Surgeon: Larey Dresser, MD;  Location[facility];  Service: Cardiovascular;  Laterality: N/A;  . QUADRICEPS TENDON REPAIR Bilateral 08/2003   Archie Endo 05/10/2011  . UMBILICAL HERNIA REPAIR  11/28/2017   w/mesh    ROS:   General:  No weight loss, Fever, chills  HEENT: No recent headaches, no nasal bleeding, no visual changes, no sore throat  Neurologic: No dizziness, blackouts, seizures. No recent symptoms of stroke or mini- stroke. No recent episodes of slurred speech, or  temporary blindness.  Cardiac: No recent episodes of chest pain/pressure, no shortness of breath at rest.  No shortness of breath with exertion.  Denies history of atrial fibrillation or irregular heartbeat  Vascular: No history of rest pain in feet.  No history of claudication.  No history of non-healing ulcer, No history of DVT   Pulmonary: No home oxygen, no productive cough, no hemoptysis,  No asthma or wheezing  Musculoskeletal:  [ ]  Arthritis, [ ]  Low back pain,  [ ]  Joint pain  Hematologic:No history of hypercoagulable state.  No history of easy bleeding.  No history of anemia  Gastrointestinal: No hematochezia or melena,  No gastroesophageal reflux, no trouble swallowing  Urinary: [ ]  chronic Kidney disease, [ ]  on HD - [ ]  MWF or [ ]  TTHS, [ ]  Burning with urination, [ ]  Frequent urination, [ ]  Difficulty urinating;   Skin: No rashes  Psychological: No history of anxiety,  No history of depression  Social History Social History   Tobacco Use  . Smoking status: Never Smoker  . Smokeless tobacco: Never Used  . Tobacco comment: quit atleast 25 yrs ago, per pt  Vaping Use  . Vaping Use: Never used  Substance Use Topics  . Alcohol use: Never    Comment: socially  . Drug use: Never    Family History Family History  Problem Relation Age of Onset  . Colon cancer Father   . Coronary artery disease Brother   . Diabetes Brother   . Heart disease Brother   . Hyperlipidemia Brother   . Peripheral vascular disease Brother        Varicose Veins  . Throat cancer Brother        abdominal cancer?   . Diabetes Son   . Hyperlipidemia Son     Allergies  Allergies  Allergen Reactions  . Metoprolol Shortness Of Breath, Palpitations and Other (See Comments)    Heart starts racing. Shallow breathing; pt states he also get skin irritation on his legs  . Metformin Hives and Swelling    On legs  . Metformin And Related Nausea And Vomiting     Current Outpatient Medications   Medication Sig Dispense Refill  . acetaminophen (TYLENOL) 500 MG tablet Take 2 tablets (1,000 mg total) by mouth every 8 (eight) hours as needed. 30 tablet 0  . amiodarone (PACERONE) 200 MG tablet Take 100 mg by mouth daily.    Marland Kitchen apixaban (ELIQUIS) 2.5 MG TABS tablet Take 2.5 mg by mouth 2 (two) times daily.     Marland Kitchen atorvastatin (LIPITOR) 80 MG tablet Take 80 mg by mouth daily.    . carvedilol (COREG) 12.5 MG tablet Take 12.5 mg by mouth 2 (two) times daily with a meal.    . cilostazol (PLETAL) 100 MG tablet Take 1 tablet (100 mg total) by mouth 2 (two) times daily. 180 tablet 3  . ezetimibe (ZETIA) 10 MG tablet  TAKE (1) TABLET DAILY. 90 tablet 0  . FLUoxetine (PROZAC) 20 MG capsule TAKE (1) CAPSULE DAILY. 90 capsule 0  . gabapentin (NEURONTIN) 100 MG capsule Take 100 mg by mouth 3 (three) times daily.    Marland Kitchen glucose blood (ACCU-CHEK AVIVA PLUS) test strip CHECK BLOOD SUGAR 4 TIMES A DAY. 100 each 11  . hydrALAZINE (APRESOLINE) 50 MG tablet Take 50 mg by mouth 3 (three) times daily.    Marland Kitchen HYDROcodone-acetaminophen (NORCO/VICODIN) 5-325 MG tablet TAKE 1 TABLET EVERY 6 HOURS AS NEEDED FOR MODERATE PAIN. 12 tablet 0  . isosorbide mononitrate (IMDUR) 30 MG 24 hr tablet Take 30 mg by mouth daily.    Marland Kitchen levothyroxine (SYNTHROID) 100 MCG tablet TAKE 1 TABLET BEFORE BREAKFAST. 90 tablet 0  . metolazone (ZAROXOLYN) 2.5 MG tablet Take 1 tablet (2.5 mg total) by mouth as directed. By HF Clinic (Patient not taking: Reported on 09/17/2020) 3 tablet 0  . Multiple Vitamins-Minerals (MULTIVITAMIN ADULTS 50+) TABS Take 1 tablet by mouth daily.    . nitroGLYCERIN (NITROSTAT) 0.4 MG SL tablet DISSOLVE 1 TABLET UNDER TONGUE AS NEEDED FOR CHEST PAIN,MAY REPEAT IN5 MINUTES FOR 2 DOSES. (Patient not taking: Reported on 09/17/2020) 25 tablet 3  . pantoprazole (PROTONIX) 40 MG tablet Take 1 tablet (40 mg total) by mouth 2 (two) times daily.    . potassium chloride SA (KLOR-CON) 20 MEQ tablet Take 20 mEq by mouth daily.    Marland Kitchen  tiotropium (SPIRIVA) 18 MCG inhalation capsule Place 18 mcg into inhaler and inhale daily as needed (for wheezing).    . torsemide (DEMADEX) 20 MG tablet Take 4 tablets (80 mg total) by mouth 2 (two) times daily. 240 tablet 3   No current facility-administered medications for this visit.    Physical Examination  Vitals:   09/17/20 1600  BP: 129/69  Pulse: 70  Resp: 20  Temp: 98 F (36.7 C)  TempSrc: Temporal  SpO2: 95%  Weight: 185 lb (83.9 kg)  Height: 5\' 7"  (1.702 m)    Body mass index is 28.98 kg/m.  General:  Alert and oriented, no acute distress HEENT: Normal Neck: No bruit or JVD Pulmonary: Clear to auscultation bilaterally Cardiac: Regular Rate and Rhythm without murmur Abdomen: Soft, non-tender, non-distended, no mass, no scars Skin: No rash Extremity Pulses:  2+ radial, brachial, femoral, dorsalis pedis, posterior tibial pulses bilaterally Musculoskeletal: No deformity or edema  Neurologic: Upper and lower extremity motor 5/5 and symmetric  DATA:    ABI Findings:  +---------+------------------+-----+----------+--------+  Right  Rt Pressure (mmHg)IndexWaveform Comment   +---------+------------------+-----+----------+--------+  Brachial 119                      +---------+------------------+-----+----------+--------+  PTA   >250       2.10 monophasic      +---------+------------------+-----+----------+--------+  DP    >252       2.12 monophasic      +---------+------------------+-----+----------+--------+  Great Toe172        1.45 Abnormal       +---------+------------------+-----+----------+--------+   +---------+------------------+-----+----------+-----------+  Left   Lt Pressure (mmHg)IndexWaveform Comment    +---------+------------------+-----+----------+-----------+  Brachial 118                          +---------+------------------+-----+----------+-----------+  PTA   87        0.73 monophasic        +---------+------------------+-----+----------+-----------+  DP    >251  2.11 monophasic? hyperemic  +---------+------------------+-----+----------+-----------+  Great Toe94        0.79 Normal          +---------+------------------+-----+----------+-----------+   +-------+-----------+-----------+------------+------------+  ABI/TBIToday's ABIToday's TBIPrevious ABIPrevious TBI  +-------+-----------+-----------+------------+------------+  Right [place]     1.45    1.12 1.12  1.12      +-------+-----------+-----------+------------+------------+  Left  [place]     0.79    [place]     0.97      +-------+-----------+-----------+------------+------------+   ASSESSMENT: PAD with chronic wounds and heavily calcified vessels. Non compressible vessels on ABI with increased pressures.   PLAN: He wants to continue with wound care.  His pain is tolerable for now and he states his wounds are improving.  F/u in 4-6 weeks for wound check.  If his pain becomes uncontrollable or the wounds fail to progress with healing he will call sooner.     Roxy Horseman PA-C [facility] Office: [phone]  MD in clinic [place].

## 2020-09-17 NOTE — Progress Notes (Signed)
Andrew [name] was seen at home today and reported feeling well. He denied chest pain, SOB, Headache, dizziness, orthopnea, fever or cough over the past week. He stated he has been compliant with his medications over the past week and his weight has been stable. He was seen in the HF clinic earlier this week and was about to leave for another doctors appointment today so the sole purpose of this visit was to refill his pillbox. Nothing further was necessary at this time. I will follow up next week.   [name], EMT 09/17/20

## 2020-09-18 ENCOUNTER — Encounter (HOSPITAL_BASED_OUTPATIENT_CLINIC_OR_DEPARTMENT_OTHER): Payer: Medicare PPO | Admitting: Internal Medicine

## 2020-09-18 ENCOUNTER — Encounter: Payer: Self-pay | Admitting: Physician Assistant

## 2020-09-18 ENCOUNTER — Ambulatory Visit: Payer: Medicare PPO | Admitting: Podiatry

## 2020-09-18 ENCOUNTER — Telehealth: Payer: Self-pay | Admitting: Family Medicine

## 2020-09-18 DIAGNOSIS — S51012A Laceration without foreign body of left elbow, initial encounter: Secondary | ICD-10-CM | POA: Diagnosis not present

## 2020-09-18 DIAGNOSIS — I110 Hypertensive heart disease with heart failure: Secondary | ICD-10-CM | POA: Diagnosis not present

## 2020-09-18 DIAGNOSIS — I509 Heart failure, unspecified: Secondary | ICD-10-CM | POA: Diagnosis not present

## 2020-09-18 DIAGNOSIS — L97811 Non-pressure chronic ulcer of other part of right lower leg limited to breakdown of skin: Secondary | ICD-10-CM | POA: Diagnosis not present

## 2020-09-18 DIAGNOSIS — I87333 Chronic venous hypertension (idiopathic) with ulcer and inflammation of bilateral lower extremity: Secondary | ICD-10-CM | POA: Diagnosis not present

## 2020-09-18 DIAGNOSIS — L97422 Non-pressure chronic ulcer of left heel and midfoot with fat layer exposed: Secondary | ICD-10-CM | POA: Diagnosis not present

## 2020-09-18 DIAGNOSIS — L97821 Non-pressure chronic ulcer of other part of left lower leg limited to breakdown of skin: Secondary | ICD-10-CM | POA: Diagnosis not present

## 2020-09-18 DIAGNOSIS — L89620 Pressure ulcer of left heel, unstageable: Secondary | ICD-10-CM | POA: Diagnosis not present

## 2020-09-18 DIAGNOSIS — E1151 Type 2 diabetes mellitus with diabetic peripheral angiopathy without gangrene: Secondary | ICD-10-CM | POA: Diagnosis not present

## 2020-09-18 DIAGNOSIS — E11621 Type 2 diabetes mellitus with foot ulcer: Secondary | ICD-10-CM | POA: Diagnosis not present

## 2020-09-18 NOTE — Addendum Note (Signed)
Encounter addended by: Harvie Junior, CMA on: 09/18/2020 10:49 AM  Actions taken: Charge Capture section accepted

## 2020-09-18 NOTE — Telephone Encounter (Signed)
Andrew [name] called in from [facility] just wanted to let Dr. Jerline Pain know that he was going to discharge the patient today Patient has been busy with Wound care and want to just focus on that right now. If any questions [phone]

## 2020-09-18 NOTE — Progress Notes (Signed)
RENSO, SWETT ([id number]) Visit Report for 09/18/2020 Fall Risk Assessment Details Patient Name: Date of Service: Andrew [name], [name] 09/18/2020 1:00 PM Medical Record Number: [id number] Patient Account Number: [id number] Date of Birth/Sex: Treating RN: [birth year] (86 y.o. [name] Primary Care Psalm Schappell: Dimas Chyle Other Clinician: Referring Audrina Marten: Treating Delawrence Fridman/Extender: Donalee Citrin in Treatment: 1 Fall Risk Assessment Items Have you had 2 or more falls in the last 12 monthso 0 Yes Have you had any fall that resulted in injury in the last 12 monthso 0 No FALLS RISK SCREEN History of falling - immediate or within 3 months 25 Yes Secondary diagnosis (Do you have 2 or more medical diagnoseso) 0 No Ambulatory aid None/bed rest/wheelchair/nurse 0 No Crutches/cane/walker 15 Yes Furniture 0 No Intravenous therapy Access/Saline/Heparin Lock 0 No Gait/Transferring Normal/ bed rest/ wheelchair 0 No Weak (short steps with or without shuffle, stooped but able to lift head while walking, may seek 10 Yes support from furniture) Impaired (short steps with shuffle, may have difficulty arising from chair, head down, impaired 0 No balance) Mental Status Oriented to own ability 0 Yes Electronic Signature(s) Signed: 09/18/2020 4:39:15 PM By: Baruch Gouty RN, BSN Entered By: Baruch Gouty on 09/18/2020 13:50:58

## 2020-09-19 NOTE — Telephone Encounter (Signed)
FYI, see message. 

## 2020-09-20 NOTE — Progress Notes (Signed)
JANI, PLOEGER ([id number]) Visit Report for 09/18/2020 Debridement Details Patient Name: Date of Service: Andrew [name], Andrew RGE A. 09/18/2020 1:00 PM Medical Record Number: [id number] Patient Account Number: [id number] Date of Birth/Sex: Treating RN: 1928/02/13 (86 y.o. Andrew [name] Primary Care Provider: Dimas Chyle Other Clinician: Referring Provider: Treating Provider/Extender: Donalee Citrin in Treatment: 1 Debridement Performed for Assessment: Wound #2 Left Calcaneus Performed By: Physician Ricard Dillon., MD Debridement Type: Debridement Severity of Tissue Pre Debridement: Fat layer exposed Level of Consciousness (Pre-procedure): Awake and Alert Pre-procedure Verification/Time Out Yes - 14:10 Taken: Start Time: 14:10 T Area Debrided (L x W): otal 2.1 (cm) x 3.2 (cm) = 6.72 (cm) Tissue and other material debrided: Viable, Non-Viable, Subcutaneous Level: Skin/Subcutaneous Tissue Debridement Description: Excisional Instrument: Curette Bleeding: Moderate Hemostasis Achieved: Pressure End Time: 14:12 Procedural Pain: 2 Post Procedural Pain: 0 Response to Treatment: Procedure was tolerated well Level of Consciousness (Post- Awake and Alert procedure): Post Debridement Measurements of Total Wound Length: (cm) 2.1 Width: (cm) 3.2 Depth: (cm) 1 Volume: (cm) 5.278 Character of Wound/Ulcer Post Debridement: Requires Further Debridement Severity of Tissue Post Debridement: Fat layer exposed Post Procedure Diagnosis Same as Pre-procedure Electronic Signature(s) Signed: 09/18/2020 5:26:06 PM By: Kela Millin Signed: 09/20/2020 6:30:39 AM By: Linton Ham MD Entered By: Linton Ham on 09/18/2020 14:16:14 -------------------------------------------------------------------------------- HPI Details Patient Name: Date of Service: Andrew [name], Andrew RGE A. 09/18/2020 1:00 PM Medical Record Number: [id number] Patient Account Number:  [id number] Date of Birth/Sex: Treating RN: 05/05/1928 (86 y.o. Andrew [name] Primary Care Provider: Dimas Chyle Other Clinician: Referring Provider: Treating Provider/Extender: Donalee Citrin in Treatment: 1 History of Present Illness HPI Description: 09/11/2020 ADMISSION This is a soon-to-be 86 year old man who was hospitalized in June and subsequently spent time at [facility]. At some point he developed a pressure ulcer on his left heel. On discharge he was seen by podiatry and referred to Dr. Donzetta Matters with regards to the possibility of PAD. The patient underwent angiography on 08/10/2020 this showed heavily calcified vessels. The common iliac artery on the left appeared to have calcific disease but there was no gradient. Left common external leg arteries were heavily tortuous left lower extremity limited angiography developed demonstrated a SFA to be helped heavily calcified throughout with multiple greater than 50% stenosis but never frankly occludes he was not felt to be a candidate for endovascular repair. I think they had a minimal look at the right side and it appeared that the SFA was occluded and reconstitutes above the knee. The overall feeling was that if he could not heal his left heel ulcer he will eventually best be served by a left above-knee amputation The last I can see is that he should have been using a Santyl wet-to-dry dressing to the heel. During his stay with podiatry was also noted that he had venous insufficiency and I think venous insufficiency wounds. His wife tells me that he has been recommended for compression stockings in the past but the patient refused, he has wounds on his bilateral anterior lower legs right greater than the left. Chronic venous inflammation and dry flaking skin. Past medical history; heart failure, right heel wound, paroxysmal atrial fibrillation on Eliquis, abdominal aortic aneurysm with previous repair,  COPD, DVT, melanoma on his back, diabetes with a recent hemoglobin A1c of 7.3 however he is not on any treatment [type II], PAD, coronary artery disease, hypertension The patient's most recent arterial studies were on 05/28/2020. This showed  an ABI on the right at 1.12 but monophasic waveforms and absent waveforms at the posterior tibial artery. On the left his ABI was 1.47 [noncompressible] he had triphasic waveforms at the ATA and monophasic at the PTA. His TBI on the right was 1.12 on the left and 0.97. It was felt his ABIs were falsely elevated 9/24. The patient followed up with Dr. Donzetta Matters and had noninvasive arterial studies. On the right his ABIs were noncompressible monophasic waveforms with a TBI of 1.45 and a right great toe pressure of 172. On the left he had monophasic waveforms with noncompressible ABIs TBI of 0.79 and a left great toe pressure of 94. He will be seen in follow-up by Dr. Donzetta Matters in a month's time. The patient also has very significant chronic venous insufficiency Electronic Signature(s) Signed: 09/20/2020 6:30:39 AM By: Linton Ham MD Entered By: Linton Ham on 09/18/2020 14:17:29 -------------------------------------------------------------------------------- Physical Exam Details Patient Name: Date of Service: Andrew [name], Andrew RGE A. 09/18/2020 1:00 PM Medical Record Number: [id number] Patient Account Number: [id number] Date of Birth/Sex: Treating RN: October 18, 1928 (86 y.o. Andrew [name] Primary Care Provider: Dimas Chyle Other Clinician: Referring Provider: Treating Provider/Extender: Donalee Citrin in Treatment: 1 Constitutional Sitting or standing Blood Pressure is within target range for patient.. Pulse regular and within target range for patient.Marland Kitchen Respirations regular, non-labored and within target range.. Temperature is normal and within the target range for the patient.Marland Kitchen Appears in no distress. Cardiovascular Is dorsalis pedis and  posterior tibial pulse on the left are palpable but faint however his foot is warm. Notes Wound exam Left heel tip. With a #5 curette and aggressive debridement to remove very adherent necrotic material hemostasis with a pressure dressing On his bilateral lower legs right greater than left he has superficial venous wounds. I did not see anything on the left anterior today. The right anterior is still open but certainly better than last time. Electronic Signature(s) Signed: 09/20/2020 6:30:39 AM By: Linton Ham MD Entered By: Linton Ham on 09/18/2020 14:18:43 -------------------------------------------------------------------------------- Physician Orders Details Patient Name: Date of Service: Andrew [name], Andrew RGE A. 09/18/2020 1:00 PM Medical Record Number: [id number] Patient Account Number: [id number] Date of Birth/Sex: Treating RN: October 29, 1928 (86 y.o. 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 Dressing Change Frequency Other: - HH to change twice a week on weeks that patient is not coming to wound care clinic Skin Barriers/Peri-Wound Care Moisturizing lotion TCA Cream or Ointment Wound Cleansing May shower with protection. - cast protector Primary Wound Dressing Wound #1 Left,Anterior Lower  Leg Calcium Alginate with Silver Wound #2 Left Calcaneus Iodoflex Wound #3 Right Lower Leg Calcium Alginate with Silver Secondary Dressing ABD pad Zetuvit or Kerramax Heel Cup - on left heel Edema Control Kerlix and Coban - Bilateral Avoid standing for long periods of time Elevate legs to the level of the heart or above for 30 minutes daily and/or when sitting, a frequency  of: Exercise regularly [facility] for [facility] care[facility] - [facility] Electronic Signature(s) Signed: 09/18/2020 5:26:06 PM By: Kela Millin Signed: 09/20/2020 6:30:39 AM By: Linton Ham MD Entered By: Kela Millin on 09/18/2020 14:12:38 -------------------------------------------------------------------------------- Problem List Details Patient Name: Date of Service: Andrew [name], Andrew RGE A. 09/18/2020 1:00 PM Medical Record Number: [id number] Patient Account Number: [id number] Date of Birth/Sex: Treating RN: 1928-07-05 (86 y.o. 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 09/11/2020 No Yes I87.333 Chronic venous hypertension (idiopathic) with ulcer and inflammation of 09/11/2020 No Yes bilateral lower extremity L97.811 Non-pressure chronic ulcer of other part of right lower leg limited to breakdown 09/11/2020 No Yes of skin L97.821 Non-pressure chronic ulcer of other part of left lower leg limited to breakdown 09/11/2020 No Yes of skin Inactive Problems Resolved Problems Electronic Signature(s) Signed: 09/20/2020 6:30:39 AM By: Linton Ham MD Entered By: Linton Ham on 09/18/2020  14:15:51 -------------------------------------------------------------------------------- Progress Note Details Patient Name: Date of Service: Andrew [name], Andrew RGE A. 09/18/2020 1:00 PM Medical Record Number: [id number] Patient Account Number: [id number] Date of Birth/Sex: Treating RN: 12-12-1928 (86 y.o. Andrew [name] Primary Care Provider: Dimas Chyle Other Clinician: Referring Provider: Treating Provider/Extender: Donalee Citrin in Treatment: 1 Subjective History of Present Illness (HPI) 09/11/2020 ADMISSION This is a soon-to-be 86 year old man who was hospitalized in June and subsequently spent time at [place] [facility]. At some point he developed a pressure ulcer on his left heel. On discharge he was seen by podiatry and referred to Dr. Donzetta Matters with regards to the possibility of PAD. The patient underwent angiography on 08/10/2020 this showed heavily calcified vessels. The common iliac artery on the left appeared to have calcific disease but there was no gradient. Left common external leg arteries were heavily tortuous left lower extremity limited angiography developed demonstrated a SFA to be helped heavily calcified throughout with multiple greater than 50% stenosis but never frankly occludes he was not felt to be a candidate for endovascular repair. I think they had a minimal look at the right side and it appeared that the SFA was occluded and reconstitutes above the knee. The overall feeling was that if he could not heal his left heel ulcer he will eventually best be served by a left above-knee amputation The last I can see is that he should have been using a Santyl wet-to-dry dressing to the heel. During his stay with podiatry was also noted that he had venous insufficiency and I think venous insufficiency wounds. His wife tells me that he has been recommended for compression stockings in the past but the patient refused, he has wounds on his bilateral  anterior lower legs right greater than the left. Chronic venous inflammation and dry flaking skin. Past medical history; heart failure, right heel wound, paroxysmal atrial fibrillation on Eliquis, abdominal aortic aneurysm with previous repair, COPD, DVT, melanoma on his back, diabetes with a recent hemoglobin A1c of 7.3 however he is not on any treatment [type II], PAD, coronary artery disease, hypertension The patient's most recent arterial studies were on 05/28/2020. This showed an ABI on the right at 1.12 but monophasic waveforms and absent waveforms at the posterior tibial artery. On the left his  ABI was 1.47 [noncompressible] he had triphasic waveforms at the ATA and monophasic at the PTA. His TBI on the right was 1.12 on the left and 0.97. It was felt his ABIs were falsely elevated 9/24. The patient followed up with Dr. Donzetta Matters and had noninvasive arterial studies. On the right his ABIs were noncompressible monophasic waveforms with a TBI of 1.45 and a right great toe pressure of 172. On the left he had monophasic waveforms with noncompressible ABIs TBI of 0.79 and a left great toe pressure of 94. He will be seen in follow-up by Dr. Donzetta Matters in a month's time. The patient also has very significant chronic venous insufficiency Objective Constitutional Sitting or standing Blood Pressure is within target range for patient.. Pulse regular and within target range for patient.Marland Kitchen Respirations regular, non-labored and within target range.. Temperature is normal and within the target range for the patient.Marland Kitchen Appears in no distress. Vitals Time Taken: 1:29 PM, Height: 67 in, Source: Stated, Weight: 185 lbs, Source: Stated, BMI: 29, Temperature: 97.7 F, Pulse: 70 bpm, Respiratory Rate: 18 breaths/min, Blood Pressure: 102/52 mmHg. Cardiovascular Is dorsalis pedis and posterior tibial pulse on the left are palpable but faint however his foot is warm. General Notes: Wound exam ooLeft heel tip. With a #5 curette  and aggressive debridement to remove very adherent necrotic material hemostasis with a pressure dressing ooOn his bilateral lower legs right greater than left he has superficial venous wounds. I did not see anything on the left anterior today. The right anterior is still open but certainly better than last time. Integumentary (Hair, Skin) Wound #1 status is Open. Original cause of wound was Gradually Appeared. The wound is located on the Left,Anterior Lower Leg. The wound measures 0.2cm length x 0.2cm width x 0.1cm depth; 0.031cm^2 area and 0.003cm^3 volume. There is Fat Layer (Subcutaneous Tissue) exposed. There is no tunneling or undermining noted. There is a small amount of serosanguineous drainage noted. The wound margin is flat and intact. There is large (67-100%) pink granulation within the wound bed. There is no necrotic tissue within the wound bed. Wound #2 status is Open. Original cause of wound was Gradually Appeared. The wound is located on the Left Calcaneus. The wound measures 2.1cm length x 3.2cm width x 1cm depth; 5.278cm^2 area and 5.278cm^3 volume. There is Fat Layer (Subcutaneous Tissue) exposed. There is no tunneling or undermining noted. There is a medium amount of serosanguineous drainage noted. The wound margin is flat and intact. There is medium (34-66%) pink, pale granulation within the wound bed. There is a medium (34-66%) amount of necrotic tissue within the wound bed including Adherent Slough. Wound #3 status is Open. Original cause of wound was Gradually Appeared. The wound is located on the Right Lower Leg. The wound measures 4.5cm length x 4.5cm width x 0.1cm depth; 15.904cm^2 area and 1.59cm^3 volume. There is Fat Layer (Subcutaneous Tissue) exposed. There is no tunneling or undermining noted. There is a medium amount of serosanguineous drainage noted. The wound margin is flat and intact. There is large (67-100%) pink granulation within the wound bed. There is no  necrotic tissue within the wound bed. Assessment Active Problems ICD-10 Type 2 diabetes mellitus with foot ulcer Non-pressure chronic ulcer of left heel and midfoot with fat layer exposed Chronic venous hypertension (idiopathic) with ulcer and inflammation of bilateral lower extremity Non-pressure chronic ulcer of other part of right lower leg limited to breakdown of skin Non-pressure chronic ulcer of other part of left lower leg limited  to breakdown of skin Procedures Wound #2 Pre-procedure diagnosis of Wound #2 is a Diabetic Wound/Ulcer of the Lower Extremity located on the Left Calcaneus .Severity of Tissue Pre Debridement is: Fat layer exposed. There was a Excisional Skin/Subcutaneous Tissue Debridement with a total area of 6.72 sq cm performed by Ricard Dillon., MD. With the following instrument(s): Curette to remove Viable and Non-Viable tissue/material. Material removed includes Subcutaneous Tissue. No specimens were taken. A time out was conducted at 14:10, prior to the start of the procedure. A Moderate amount of bleeding was controlled with Pressure. The procedure was tolerated well with a pain level of 2 throughout and a pain level of 0 following the procedure. Post Debridement Measurements: 2.1cm length x 3.2cm width x 1cm depth; 5.278cm^3 volume. Character of Wound/Ulcer Post Debridement requires further debridement. Severity of Tissue Post Debridement is: Fat layer exposed. Post procedure Diagnosis Wound #2: Same as Pre-Procedure Plan Follow-up Appointments: Return Appointment in 2 weeks. Dressing Change Frequency: Other: - HH to change twice a week on weeks that patient is not coming to wound care clinic Skin Barriers/Peri-Wound Care: Moisturizing lotion TCA Cream or Ointment Wound Cleansing: May shower with protection. - cast protector Primary Wound Dressing: Wound #1 Left,Anterior Lower Leg: Calcium Alginate with Silver Wound #2 Left Calcaneus: Iodoflex Wound #3  Right Lower Leg: Calcium Alginate with Silver Secondary Dressing: ABD pad Zetuvit or Kerramax Heel Cup - on left heel Edema Control: Kerlix and Coban - Bilateral Avoid standing for long periods of time Elevate legs to the level of the heart or above for 30 minutes daily and/or when sitting, a frequency of: Exercise regularly Home Health: [facility] for wound care. - [facility] 1. Continue the Iodoflex on the left heel. 2. Silver alginate on the right anterior lower leg and the left anterior lower leg 3. Bilateral and kerlix and Coban Electronic Signature(s) Signed: 09/20/2020 6:30:39 AM By: Linton Ham MD Entered By: Linton Ham on 09/18/2020 14:19:39 -------------------------------------------------------------------------------- SuperBill Details Patient Name: Date of Service: Andrew [name], Andrew RGE A. 09/18/2020 Medical Record Number: [id number] Patient Account Number: [id number] Date of Birth/Sex: Treating RN: 1928/03/14 (86 y.o. Andrew [name] Primary Care Provider: Dimas Chyle Other Clinician: Referring Provider: Treating Provider/Extender: Donalee Citrin in Treatment: 1 Diagnosis Coding ICD-10 Codes Code Description [phone] Type 2 diabetes mellitus with foot ulcer L97.422 Non-pressure chronic ulcer of left heel and midfoot with fat layer exposed I87.333 Chronic venous hypertension (idiopathic) with ulcer and inflammation of bilateral lower extremity L97.811 Non-pressure chronic ulcer of other part of right lower leg limited to breakdown of skin L97.821 Non-pressure chronic ulcer of other part of left lower leg limited to breakdown of skin Facility Procedures CPT4 Code: [id number] Description: 53976 - DEB SUBQ TISSUE 20 SQ CM/< ICD-10 Diagnosis Description L97.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

## 2020-09-20 NOTE — Progress Notes (Signed)
Andrew, [name] ([id number]) Visit Report for 09/18/2020 Arrival Information Details Patient Name: Date of Service: Andrew [name], [name] 09/18/2020 1:00 PM Medical Record Number: [id number] Patient Account Number: [id number] Date of Birth/Sex: Treating RN: 06-11-1928 (86 y.o. [name] Primary Care Elaya Droege: Dimas Chyle Other Clinician: Referring Sheily Lineman: Treating Arilynn Blakeney/Extender: Donalee Citrin in Treatment: 1 Visit Information History Since Last Visit All ordered tests and consults were completed: Yes Patient Arrived: Wheel Chair Added or deleted any medications: Yes Arrival Time: 13:27 Any new allergies or adverse reactions: No Accompanied By: spouse Had a fall or experienced change in Yes Transfer Assistance: None activities of daily living that may affect Patient Identification Verified: Yes risk of falls: Secondary Verification Process Completed: Yes Signs or symptoms of abuse/neglect since last visito No Patient Requires Transmission-Based Precautions: No Hospitalized since last visit: No Patient Has Alerts: No Has Dressing in Place as Prescribed: Yes Pain Present Now: No Electronic Signature(s) Signed: 09/18/2020 4:39:15 PM By: Baruch Gouty RN, BSN Entered By: Baruch Gouty on 09/18/2020 13:30:27 -------------------------------------------------------------------------------- Encounter Discharge Information Details Patient Name: Date of Service: Andrew [name], [name] 09/18/2020 1:00 PM Medical Record Number: [id number] Patient Account Number: [id number] Date of Birth/Sex: Treating RN: [birth year] (86 y.o. [name] Primary Care Brando Taves: Dimas Chyle Other Clinician: Referring Jonluke Cobbins: Treating Ardell Aaronson/Extender: Donalee Citrin in Treatment: 1 Encounter Discharge Information Items Post Procedure Vitals Discharge Condition: Stable Temperature (F): 97.7 Ambulatory Status: Wheelchair Pulse (bpm):  70 Discharge Destination: Home Respiratory Rate (breaths/min): 18 Transportation: Private Auto Blood Pressure (mmHg): 102/52 Accompanied By: wife Schedule Follow-up Appointment: Yes Clinical Summary of Care: Patient Declined Electronic Signature(s) Signed: 09/18/2020 5:24:54 PM By: Deon Pilling Entered By: Deon Pilling on 09/18/2020 15:29:54 -------------------------------------------------------------------------------- General Visit Notes Details Patient Name: Date of Service: Andrew [name], [name] 09/18/2020 1:00 PM Medical Record Number: [id number] Patient Account Number: [id number] Date of Birth/Sex: Treating RN: Jan 10, 1928 (86 y.o. [name] Primary Care Rufus Cypert: Dimas Chyle Other Clinician: Referring Keigen Caddell: Treating Alyssia Heese/Extender: Donalee Citrin in Treatment: 1 Notes small skin tear to left elbow from fall last night. applied antibiotic ointment and a foam border Electronic Signature(s) Signed: 09/18/2020 4:39:15 PM By: Baruch Gouty RN, BSN Entered By: Baruch Gouty on 09/18/2020 13:50:24 -------------------------------------------------------------------------------- Lower Extremity Assessment Details Patient Name: Date of Service: Andrew [name], [name] 09/18/2020 1:00 PM Medical Record Number: [id number] Patient Account Number: [id number] Date of Birth/Sex: Treating RN: 08-10-1928 (86 y.o. [name] Primary Care Kyrus Hyde: Dimas Chyle Other Clinician: Referring Lemario Chaikin: Treating Jo-Ann Johanning/Extender: Donalee Citrin in Treatment: 1 Edema Assessment Assessed: [Left: No] [Right: No] Edema: [Left: Yes] [Right: Yes] Calf Left: Right: Point of Measurement: 44 cm From Medial Instep 33.2 cm 31.3 cm Ankle Left: Right: Point of Measurement: 10 cm From Medial Instep 21.8 cm 22 cm Vascular Assessment Pulses: Dorsalis Pedis Palpable: [Left:No] [Right:Yes] Electronic Signature(s) Signed: 09/18/2020  4:39:15 PM By: Baruch Gouty RN, BSN Entered By: Baruch Gouty on 09/18/2020 13:46:24 -------------------------------------------------------------------------------- Multi Wound Chart Details Patient Name: Date of Service: Andrew [name], [name] 09/18/2020 1:00 PM Medical Record Number: [id number] Patient Account Number: [id number] Date of Birth/Sex: Treating RN: 09/27/1928 (86 y.o. [name] Primary Care Daviona Herbert: Dimas Chyle Other Clinician: Referring Haleigh Desmith: Treating Kerina Simoneau/Extender: Donalee Citrin in Treatment: 1 Vital Signs Height(in): 14 Pulse(bpm): 70 Weight(lbs): 185 Blood Pressure(mmHg): 102/52 Body Mass Index(BMI): 29 Temperature(F): 97.7 Respiratory Rate(breaths/min): 18 Photos: [1:No Photos Left, Anterior Lower Leg] [  2:No Photos Left Calcaneus] [3:No Photos Right Lower Leg] Wound Location: [1:Gradually Appeared] [2:Gradually Appeared] [3:Gradually Appeared] Wounding Event: [1:Venous Leg Ulcer] [2:Diabetic Wound/Ulcer of the Lower] [3:Venous Leg Ulcer] Primary Etiology: [1:Congestive Heart Failure, Coronary Congestive Heart Failure, Coronary Congestive Heart Failure, Coronary] [2:Extremity] Comorbid History: [1:Artery Disease, Hypertension, Peripheral Arterial Disease, Peripheral Peripheral Arterial Disease, Peripheral Peripheral Arterial Disease, Peripheral Venous Disease, Type II Diabetes 06/25/2020] [2:Artery Disease, Hypertension, Venous  Disease, Type II Diabetes 05/26/2020] [3:Artery Disease, Hypertension, Venous Disease, Type II Diabetes 06/25/2020] Date Acquired: [1:1] [2:1] [3:1] Weeks of Treatment: [1:Open] [2:Open] [3:Open] Wound Status: [1:0.2x0.2x0.1] [2:2.1x3.2x1] [3:4.5x4.5x0.1] Measurements L x W x D (cm) [1:0.031] [2:5.278] [3:15.904] A (cm) : rea [1:0.003] [2:5.278] [3:1.59] Volume (cm) : [1:100.00%] [2:20.90%] [3:89.60%] % Reduction in A [1:rea: 100.00%] [2:-690.10%] [3:94.80%] % Reduction in Volume: [1:Full  Thickness Without Exposed] [2:Grade 2] [3:Full Thickness Without Exposed] Classification: [1:Support Structures Small] [2:Medium] [3:Support Structures Medium] Exudate A mount: [1:Serosanguineous] [2:Serosanguineous] [3:Serosanguineous] Exudate Type: [1:red, brown] [2:red, brown] [3:red, brown] Exudate Color: [1:Flat and Intact] [2:Flat and Intact] [3:Flat and Intact] Wound Margin: [1:Large (67-100%)] [2:Medium (34-66%)] [3:Large (67-100%)] Granulation A mount: [1:Pink] [2:Pink, Pale] [3:Pink] Granulation Quality: [1:None Present (0%)] [2:Medium (34-66%)] [3:None Present (0%)] Necrotic A mount: [1:Fat Layer (Subcutaneous Tissue): Yes Fat Layer (Subcutaneous Tissue): Yes Fat Layer (Subcutaneous Tissue): Yes] Exposed Structures: [1:Fascia: No Tendon: No Muscle: No Joint: No Bone: No Medium (34-66%)] [2:Fascia: No Tendon: No Muscle: No Joint: No Bone: No None] [3:Fascia: No Tendon: No Muscle: No Joint: No Bone: No Medium (34-66%)] Epithelialization: [1:N/A] [2:Debridement - Excisional] [3:N/A] Debridement: Pre-procedure Verification/Time Out N/A [2:14:10] [3:N/A] Taken: [1:N/A] [2:Subcutaneous] [3:N/A] Tissue Debrided: [1:N/A] [2:Skin/Subcutaneous Tissue] [3:N/A] Level: [1:N/A] [2:6.72] [3:N/A] Debridement A (sq cm): [1:rea N/A] [2:Curette] [3:N/A] Instrument: [1:N/A] [2:Moderate] [3:N/A] Bleeding: [1:N/A] [2:Pressure] [3:N/A] Hemostasis A chieved: [1:N/A] [2:2] [3:N/A] Procedural Pain: [1:N/A] [2:0] [3:N/A] Post Procedural Pain: [1:N/A] [2:Procedure was tolerated well] [3:N/A] Debridement Treatment Response: [1:N/A] [2:2.1x3.2x1] [3:N/A] Post Debridement Measurements L x W x D (cm) [1:N/A] [2:5.278] [3:N/A] Post Debridement Volume: (cm) [1:N/A] [2:Debridement] [3:N/A] Treatment Notes Electronic Signature(s) Signed: 09/18/2020 5:26:06 PM By: Kela Millin Signed: 09/20/2020 6:30:39 AM By: Linton Ham MD Entered By: Linton Ham on 09/18/2020  14:16:06 -------------------------------------------------------------------------------- Multi-Disciplinary Care Plan Details Patient Name: Date of Service: Andrew [name], [name] 09/18/2020 1:00 PM Medical Record Number: [id number] Patient Account Number: [id number] Date of Birth/Sex: Treating RN: Feb 01, 1928 (86 y.o. [name] Primary Care Dutch Ing: Dimas Chyle Other Clinician: Referring Emma Schupp: Treating October Peery/Extender: Donalee Citrin in Treatment: 1 Active Inactive Pain, Acute or Chronic Nursing Diagnoses: Pain, acute or chronic: actual or potential Goals: Patient/caregiver will verbalize adequate pain control between visits Date Initiated: 09/11/2020 Target Resolution Date: 10/16/2020 Goal Status: Active Interventions: Provide education on pain management Notes: Venous Leg Ulcer Nursing Diagnoses: Actual venous Insuffiency (use after diagnosis is confirmed) Goals: Patient/caregiver will verbalize understanding of disease process and disease management Date Initiated: 09/11/2020 Target Resolution Date: 10/16/2020 Goal Status: Active Interventions: Compression as ordered Notes: Wound/Skin Impairment Nursing Diagnoses: Impaired tissue integrity Goals: Ulcer/skin breakdown will have a volume reduction of 50% by week 8 Date Initiated: 09/11/2020 Target Resolution Date: 10/16/2020 Goal Status: Active Interventions: Provide education on ulcer and skin care Notes: Electronic Signature(s) Signed: 09/18/2020 5:26:06 PM By: Kela Millin Entered By: Kela Millin on 09/18/2020 14:01:38 -------------------------------------------------------------------------------- Pain Assessment Details Patient Name: Date of Service: Andrew [name], [name] 09/18/2020 1:00 PM Medical Record Number: [id number] Patient Account Number: [id number] Date of Birth/Sex: Treating RN: Mar 24, 1928 (86  y.o. [name] Primary Care Porcia Morganti: Dimas Chyle Other Clinician: Referring Andrell Bergeson: Treating Kalik Hoare/Extender: Donalee Citrin in Treatment: 1 Active Problems Location of Pain Severity and Description of Pain Patient Has Paino No Site Locations Rate the pain. Current Pain Level: 0 Pain Management and Medication Current Pain Management: Electronic Signature(s) Signed: 09/18/2020 4:39:15 PM By: Baruch Gouty RN, BSN Entered By: Baruch Gouty on 09/18/2020 13:30:44 -------------------------------------------------------------------------------- Patient/Caregiver Education Details Patient Name: Date of Service: Andrew [name], [name] 9/24/2021andnbsp1:00 PM Medical Record Number: [id number] Patient Account Number: [id number] Date of Birth/Gender: Treating RN: April 18, 1928 (86 y.o. [name] Primary Care Physician: Dimas Chyle Other Clinician: Referring Physician: Treating Physician/Extender: Donalee Citrin in Treatment: 1 Education Assessment Education Provided To: Patient Education Topics Provided Wound/Skin Impairment: Handouts: Caring for Your Ulcer Methods: Explain/Verbal Responses: State content correctly Electronic Signature(s) Signed: 09/18/2020 5:26:06 PM By: Kela Millin Entered By: Kela Millin on 09/18/2020 14:01:49 -------------------------------------------------------------------------------- Wound Assessment Details Patient Name: Date of Service: Andrew [name], [name] 09/18/2020 1:00 PM Medical Record Number: [id number] Patient Account Number: [id number] Date of Birth/Sex: Treating RN: [birth year] (86 y.o. [name] Primary Care Barbarann Kelly: Dimas Chyle Other Clinician: Referring Kiowa Peifer: Treating Rayvon Brandvold/Extender: Donalee Citrin in Treatment: 1 Wound Status Wound Number: 1 Primary Venous Leg Ulcer Etiology: Wound Location: Left, Anterior Lower Leg Wound Open Wounding Event: Gradually  Appeared Status: Date Acquired: 06/25/2020 Comorbid Congestive Heart Failure, Coronary Artery Disease, Hypertension, Weeks Of Treatment: 1 History: Peripheral Arterial Disease, Peripheral Venous Disease, Type II Clustered Wound: No Diabetes Wound Measurements Length: (cm) 0.2 Width: (cm) 0.2 Depth: (cm) 0.1 Area: (cm) 0.031 Volume: (cm) 0.003 % Reduction in Area: 100% % Reduction in Volume: 100% Epithelialization: Medium (34-66%) Tunneling: No Undermining: No Wound Description Classification: Full Thickness Without Exposed Support Structures Wound Margin: Flat and Intact Exudate Amount: Small Exudate Type: Serosanguineous Exudate Color: red, brown Foul Odor After Cleansing: No Slough/Fibrino Yes Wound Bed Granulation Amount: Large (67-100%) Exposed Structure Granulation Quality: Pink Fascia Exposed: No Necrotic Amount: None Present (0%) Fat Layer (Subcutaneous Tissue) Exposed: Yes Tendon Exposed: No Muscle Exposed: No Joint Exposed: No Bone Exposed: No Treatment Notes Wound #1 (Left, Anterior Lower Leg) 1. Cleanse With Wound Cleanser Soap and water 2. Periwound Care Moisturizing lotion TCA Cream 3. Primary Dressing Applied Calcium Alginate Ag 4. Secondary Dressing ABD Pad Dry Gauze 6. 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 09/18/2020 1:00 PM Medical Record Number: [id number] Patient Account Number: [id number] Date of Birth/Sex: Treating RN: 11-Dec-1928 (86 y.o. [name] Primary Care Patsie Mccardle: Dimas Chyle Other Clinician: Referring Agron Swiney: Treating Chason Mciver/Extender: Donalee Citrin in Treatment: 1 Wound Status Wound Number: 2 Primary Diabetic Wound/Ulcer of the Lower Extremity Etiology: Wound Location: Left Calcaneus Wound Open Wounding Event: Gradually Appeared Status: Date Acquired: 05/26/2020 Comorbid Congestive Heart Failure, Coronary Artery Disease, Hypertension, Weeks Of Treatment: 1 History: Peripheral Arterial Disease, Peripheral Venous Disease, Type II Clustered Wound: No Diabetes Wound Measurements Length: (cm) 2.1 Width: (cm) 3.2 Depth: (cm) 1 Area: (cm) 5.278 Volume: (cm) 5.278 % Reduction in Area: 20.9% % Reduction in Volume: -690.1% Epithelialization: None Tunneling: No Undermining: No Wound Description Classification: Grade 2 Wound Margin: Flat and Intact Exudate Amount: Medium Exudate Type:  Serosanguineous Exudate Color: red, brown Foul Odor After Cleansing: No Slough/Fibrino Yes Wound Bed Granulation Amount: Medium (34-66%) Exposed Structure Granulation Quality: Pink, Pale Fascia Exposed: No Necrotic Amount: Medium (34-66%) Fat Layer (Subcutaneous Tissue) Exposed: Yes Necrotic Quality: Adherent Slough Tendon Exposed: No Muscle Exposed: No Joint Exposed: No Bone Exposed: No Treatment Notes Wound #2 (Left Calcaneus) 1. Cleanse With Soap and water 2. Periwound Care Moisturizing lotion TCA Cream 3. Primary Dressing Applied Iodoflex 4. Secondary Dressing Dry Gauze Heel Cup 6. Support Layer Applied Kerlix/Coban Notes netting. Electronic Signature(s) Signed: 09/18/2020 4:39:15 PM By: Baruch Gouty RN, BSN Signed: 09/18/2020 4:39:15 PM By: Baruch Gouty RN, BSN Entered By: Baruch Gouty on 09/18/2020 13:38:30 -------------------------------------------------------------------------------- Wound Assessment Details Patient Name: Date of Service: Andrew [name], [name] 09/18/2020 1:00 PM Medical Record Number: [id number] Patient Account Number: [id number] Date of Birth/Sex: Treating RN: 1928/01/11 (86 y.o.  [name] Primary Care Gari Trovato: Dimas Chyle Other Clinician: Referring Gracilyn Gunia: Treating Billal Rollo/Extender: Donalee Citrin in Treatment: 1 Wound Status Wound Number: 3 Primary Venous Leg Ulcer Etiology: Wound Location: Right Lower Leg Wound Open Wounding Event: Gradually Appeared Status: Date Acquired: 06/25/2020 Comorbid Congestive Heart Failure, Coronary Artery Disease, Hypertension, Weeks Of Treatment: 1 History: Peripheral Arterial Disease, Peripheral Venous Disease, Type II Clustered Wound: No Diabetes Wound Measurements Length: (cm) 4.5 Width: (cm) 4.5 Depth: (cm) 0.1 Area: (cm) 15.904 Volume: (cm) 1.59 % Reduction in Area: 89.6% % Reduction in Volume: 94.8% Epithelialization: Medium (34-66%) Tunneling: No Undermining: No Wound Description Classification: Full Thickness Without Exposed Support Structures Wound Margin: Flat and Intact Exudate Amount: Medium Exudate Type: Serosanguineous Exudate Color: red, brown Foul Odor After Cleansing: No Slough/Fibrino Yes Wound Bed Granulation Amount: Large (67-100%) Exposed Structure Granulation Quality: Pink Fascia Exposed: No Necrotic Amount: None Present (0%) Fat Layer (Subcutaneous Tissue) Exposed: Yes Tendon Exposed: No Muscle Exposed: No Joint Exposed: No Bone Exposed: No Treatment Notes Wound #3 (Right Lower Leg) 1. Cleanse With Wound Cleanser Soap and water 2. Periwound Care Moisturizing lotion TCA Cream 3. Primary Dressing Applied Calcium Alginate Ag 4. Secondary Dressing ABD Pad Dry Gauze 6. 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 09/18/2020 1:00 PM Medical Record Number:  [id number] Patient Account Number: [id number] Date of Birth/Sex: Treating RN: December 12, 1928 (86 y.o. [name] Primary Care Ana Liaw: Dimas Chyle Other Clinician: Referring Kamariyah Timberlake: Treating Halee Glynn/Extender: Donalee Citrin in Treatment: 1 Vital Signs Time Taken: 13:29 Temperature (F): 97.7 Height (in): 67 Pulse (bpm): 70 Source: Stated Respiratory Rate (breaths/min): 18 Weight (lbs): 185 Blood Pressure (mmHg): 102/52 Source: Stated Reference Range: 80 - 120 mg / dl Body Mass Index (BMI): 29 Electronic Signature(s) Signed: 09/18/2020 4:39:15 PM By: Baruch Gouty RN, BSN Entered By: Baruch Gouty on 09/18/2020 13:30:16

## 2020-09-21 DIAGNOSIS — E1169 Type 2 diabetes mellitus with other specified complication: Secondary | ICD-10-CM | POA: Diagnosis not present

## 2020-09-21 DIAGNOSIS — I872 Venous insufficiency (chronic) (peripheral): Secondary | ICD-10-CM | POA: Diagnosis not present

## 2020-09-21 DIAGNOSIS — I5022 Chronic systolic (congestive) heart failure: Secondary | ICD-10-CM | POA: Diagnosis not present

## 2020-09-21 DIAGNOSIS — D631 Anemia in chronic kidney disease: Secondary | ICD-10-CM | POA: Diagnosis not present

## 2020-09-21 DIAGNOSIS — E1122 Type 2 diabetes mellitus with diabetic chronic kidney disease: Secondary | ICD-10-CM | POA: Diagnosis not present

## 2020-09-21 DIAGNOSIS — N184 Chronic kidney disease, stage 4 (severe): Secondary | ICD-10-CM | POA: Diagnosis not present

## 2020-09-21 DIAGNOSIS — L89620 Pressure ulcer of left heel, unstageable: Secondary | ICD-10-CM | POA: Diagnosis not present

## 2020-09-21 DIAGNOSIS — E1151 Type 2 diabetes mellitus with diabetic peripheral angiopathy without gangrene: Secondary | ICD-10-CM | POA: Diagnosis not present

## 2020-09-21 DIAGNOSIS — I130 Hypertensive heart and chronic kidney disease with heart failure and stage 1 through stage 4 chronic kidney disease, or unspecified chronic kidney disease: Secondary | ICD-10-CM | POA: Diagnosis not present

## 2020-09-24 ENCOUNTER — Other Ambulatory Visit (HOSPITAL_COMMUNITY): Payer: Self-pay

## 2020-09-24 ENCOUNTER — Other Ambulatory Visit (HOSPITAL_COMMUNITY): Payer: Self-pay | Admitting: *Deleted

## 2020-09-24 DIAGNOSIS — I872 Venous insufficiency (chronic) (peripheral): Secondary | ICD-10-CM | POA: Diagnosis not present

## 2020-09-24 DIAGNOSIS — I130 Hypertensive heart and chronic kidney disease with heart failure and stage 1 through stage 4 chronic kidney disease, or unspecified chronic kidney disease: Secondary | ICD-10-CM | POA: Diagnosis not present

## 2020-09-24 DIAGNOSIS — L89620 Pressure ulcer of left heel, unstageable: Secondary | ICD-10-CM | POA: Diagnosis not present

## 2020-09-24 DIAGNOSIS — D631 Anemia in chronic kidney disease: Secondary | ICD-10-CM | POA: Diagnosis not present

## 2020-09-24 DIAGNOSIS — N184 Chronic kidney disease, stage 4 (severe): Secondary | ICD-10-CM | POA: Diagnosis not present

## 2020-09-24 DIAGNOSIS — E1169 Type 2 diabetes mellitus with other specified complication: Secondary | ICD-10-CM | POA: Diagnosis not present

## 2020-09-24 DIAGNOSIS — I5022 Chronic systolic (congestive) heart failure: Secondary | ICD-10-CM | POA: Diagnosis not present

## 2020-09-24 DIAGNOSIS — E1151 Type 2 diabetes mellitus with diabetic peripheral angiopathy without gangrene: Secondary | ICD-10-CM | POA: Diagnosis not present

## 2020-09-24 DIAGNOSIS — E1122 Type 2 diabetes mellitus with diabetic chronic kidney disease: Secondary | ICD-10-CM | POA: Diagnosis not present

## 2020-09-24 MED ORDER — CILOSTAZOL 100 MG PO TABS: 100.0000 mg | ORAL_TABLET | Freq: Two times a day (BID) | ORAL | 3 refills | Status: DC

## 2020-09-24 NOTE — Progress Notes (Signed)
Paramedicine Encounter    Patient ID: ENOC GETTER, male    DOB: December 07, 1928, 86 y.o.   MRN: [id number]   Patient Care Team: [name], MD as PCP - General (Family Medicine) Larey Dresser, MD as PCP - Advanced Heart Failure (Cardiology) Larey Dresser, MD as Consulting Physician (Cardiology) Jorge Ny, LCSW as Social Worker (Licensed Clinical Social Worker) Gardiner Barefoot, DPM as Consulting Physician (Podiatry) [name], MD (Family Medicine) Patient, No Pcp Per (General Practice)  Patient Active Problem List   Diagnosis Date Noted  . Acute respiratory failure with hypercapnia ([place]) 06/02/2020  . Acute metabolic encephalopathy 62/95/2841  . CKD (chronic kidney disease), stage IV ([place]) 06/02/2020  . PAF (paroxysmal atrial fibrillation) ([place]) 06/02/2020  . Hypothyroidism 06/02/2020  . Chronic anticoagulation 06/02/2020  . Debility 06/01/2020  . Lumbar spondylosis 10/04/2018  . Chronic respiratory failure with hypoxia ([place]) 09/16/2018  . Pain of right thumb 06/19/2018  . Bilateral recurrent inguinal hernias 11/28/2017  . Carotid artery stenosis 01/12/2016  . Chronic systolic CHF (congestive heart failure) ([place]) 12/02/2015  . 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 ([place]) 10/16/2015  . PVD (peripheral vascular disease) ([place])   . Hereditary and idiopathic peripheral neuropathy 10/02/2015  . Peripelvic (lymphatic) cyst   . Pernicious anemia 07/15/2015  . Bilateral renal cysts 07/12/2015  . Lung nodule, 72mm RML CT 07/12/15 07/12/2015  . Stage 3 chronic kidney disease 06/14/2015  . Paroxysmal atrial fibrillation ([place]) 05/28/2015  . AAA (abdominal aortic aneurysm) without rupture ([place]) 09/30/2014  . Cardiomyopathy, ischemic 05/23/2014  . DM (diabetes mellitus), type 2 with renal complications ([place]) 32/44/0102  . Depression, major, single episode, complete remission ([place]) 03/18/2010  . Hypothyroidism 02/03/2009  . Osteoarthritis 05/27/2008  .  Hyperlipidemia associated with type 2 diabetes mellitus ([place]) 06/14/2007  . Hypertension associated with diabetes ([place]) 06/14/2007  . GERD 06/14/2007    Current Outpatient Medications:  .  acetaminophen (TYLENOL) 500 MG tablet, Take 2 tablets (1,000 mg total) by mouth every 8 (eight) hours as needed., Disp: 30 tablet, Rfl: 0 .  amiodarone (PACERONE) 200 MG tablet, Take 100 mg by mouth daily., Disp: , Rfl:  .  apixaban (ELIQUIS) 2.5 MG TABS tablet, Take 2.5 mg by mouth 2 (two) times daily. , Disp: , Rfl:  .  atorvastatin (LIPITOR) 80 MG tablet, Take 80 mg by mouth daily., Disp: , Rfl:  .  carvedilol (COREG) 12.5 MG tablet, Take 12.5 mg by mouth 2 (two) times daily with a meal., Disp: , Rfl:  .  ezetimibe (ZETIA) 10 MG tablet, TAKE (1) TABLET DAILY., Disp: 90 tablet, Rfl: 0 .  FLUoxetine (PROZAC) 20 MG capsule, TAKE (1) CAPSULE DAILY., Disp: 90 capsule, Rfl: 0 .  gabapentin (NEURONTIN) 100 MG capsule, Take 100 mg by mouth 3 (three) times daily., Disp: , Rfl:  .  hydrALAZINE (APRESOLINE) 50 MG tablet, Take 50 mg by mouth 3 (three) times daily., Disp: , Rfl:  .  isosorbide mononitrate (IMDUR) 30 MG 24 hr tablet, Take 30 mg by mouth daily., Disp: , Rfl:  .  levothyroxine (SYNTHROID) 100 MCG tablet, TAKE 1 TABLET BEFORE BREAKFAST., Disp: 90 tablet, Rfl: 0 .  Multiple Vitamins-Minerals (MULTIVITAMIN ADULTS 50+) TABS, Take 1 tablet by mouth daily., Disp: , Rfl:  .  pantoprazole (PROTONIX) 40 MG tablet, Take 1 tablet (40 mg total) by mouth 2 (two) times daily., Disp: , Rfl:  .  potassium chloride SA (KLOR-CON) 20 MEQ tablet, Take 20 mEq by mouth  daily., Disp: , Rfl:  .  tiotropium (SPIRIVA) 18 MCG inhalation capsule, Place 18 mcg into inhaler and inhale daily as needed (for wheezing)., Disp: , Rfl:  .  torsemide (DEMADEX) 20 MG tablet, Take 4 tablets (80 mg total) by mouth 2 (two) times daily., Disp: 240 tablet, Rfl: 3 .  cilostazol (PLETAL) 100 MG tablet, Take 1 tablet (100 mg total) by mouth 2 (two)  times daily., Disp: 180 tablet, Rfl: 3 .  glucose blood (ACCU-CHEK AVIVA PLUS) test strip, CHECK BLOOD SUGAR 4 TIMES A DAY., Disp: 100 each, Rfl: 11 .  HYDROcodone-acetaminophen (NORCO/VICODIN) 5-325 MG tablet, TAKE 1 TABLET EVERY 6 HOURS AS NEEDED FOR MODERATE PAIN., Disp: 12 tablet, Rfl: 0 .  metolazone (ZAROXOLYN) 2.5 MG tablet, Take 1 tablet (2.5 mg total) by mouth as directed. By [facility] Clinic (Patient not taking: Reported on 09/17/2020), Disp: 3 tablet, Rfl: 0 .  nitroGLYCERIN (NITROSTAT) 0.4 MG SL tablet, DISSOLVE 1 TABLET UNDER TONGUE AS NEEDED FOR CHEST PAIN,MAY REPEAT IN5 MINUTES FOR 2 DOSES. (Patient not taking: Reported on 09/17/2020), Disp: 25 tablet, Rfl: 3 Allergies  Allergen Reactions  . Metoprolol Shortness Of Breath, Palpitations and Other (See Comments)    Heart starts racing. Shallow breathing; pt states he also get skin irritation on his legs  . Metformin Hives and Swelling    On legs  . Metformin And Related Nausea And Vomiting      Social History   Socioeconomic History  . Marital status: Married    Spouse name: Not on file  . Number of children: Not on file  . Years of education: Not on file  . Highest education level: Not on file  Occupational History  . Occupation: Retired  Tobacco Use  . Smoking status: Never Smoker  . Smokeless tobacco: Never Used  . Tobacco comment: quit atleast 25 yrs ago, per pt  Vaping Use  . Vaping Use: Never used  Substance and Sexual Activity  . Alcohol use: Never    Comment: socially  . Drug use: Never  . Sexual activity: Not Currently  Other Topics Concern  . Not on file  Social History Narrative   ** Merged History Encounter **       Pt lives in [place] with spouse. Retired from [employer].  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   Social Determinants of Health   Financial Resource Strain:   . Difficulty of Paying Living Expenses: Not on file  Food Insecurity:   .  Worried About Charity fundraiser in the Last Year: Not on file  . Ran Out of Food in the Last Year: Not on file  Transportation Needs:   . Lack of Transportation (Medical): Not on file  . Lack of Transportation (Non-Medical): Not on file  Physical Activity:   . Days of Exercise per Week: Not on file  . Minutes of Exercise per Session: Not on file  Stress:   . Feeling of Stress : Not on file  Social Connections:   . Frequency of Communication with Friends and Family: Not on file  . Frequency of Social Gatherings with Friends and Family: Not on file  . Attends Religious Services: Not on file  . Active Member of Clubs or Organizations: Not on file  . Attends Archivist Meetings: Not on file  . Marital Status: Not on file  Intimate Partner Violence:   . Fear of Current or Ex-Partner: Not on file  . Emotionally  Abused: Not on file  . Physically Abused: Not on file  . Sexually Abused: Not on file    Physical Exam Cardiovascular:     Rate and Rhythm: Normal rate and regular rhythm.     Pulses: Normal pulses.  Pulmonary:     Effort: Pulmonary effort is normal.     Breath sounds: Normal breath sounds.  Musculoskeletal:        General: Normal range of motion.     Right lower leg: No edema.     Left lower leg: No edema.  Skin:    General: Skin is warm and dry.     Capillary Refill: Capillary refill takes less than 2 seconds.  Neurological:     Mental Status: He is alert and oriented to person, place, and time.  Psychiatric:        Mood and Affect: Mood normal.         Future Appointments  Date Time Provider [place]  10/02/2020  1:00 PM Ricard Dillon, MD [facility]  10/06/2020  3:00 PM Larey Dresser, MD [facility] None  10/15/2020  2:00 PM [facility]  11/03/2020  2:20 PM Larey Dresser, MD [facility] None    BP (!) 121/52 (BP Location: Left Arm, Patient Position: Sitting, Cuff Size: Normal)   Pulse 64   Resp 16   Wt 182 lb (82.6  kg)   SpO2 95%   BMI 28.51 kg/m   Weight yesterday- did not weigh Last visit weight- 185 lb  Mr Nickless was seen at home today and reported feeling well. He denied chest pain, SOB, headache, dizziness, orthopnea, fever or cough over the past week. He stated he has been compliant with his medications since our last visit and his weight has been stable. His medications were verified and his pillbox was refilled. I will follow up next week.   [name], EMT 09/24/20  ACTION: Home visit completed Next visit planned for 1 week

## 2020-09-28 DIAGNOSIS — I130 Hypertensive heart and chronic kidney disease with heart failure and stage 1 through stage 4 chronic kidney disease, or unspecified chronic kidney disease: Secondary | ICD-10-CM | POA: Diagnosis not present

## 2020-09-28 DIAGNOSIS — E1169 Type 2 diabetes mellitus with other specified complication: Secondary | ICD-10-CM | POA: Diagnosis not present

## 2020-09-28 DIAGNOSIS — N184 Chronic kidney disease, stage 4 (severe): Secondary | ICD-10-CM | POA: Diagnosis not present

## 2020-09-28 DIAGNOSIS — E1122 Type 2 diabetes mellitus with diabetic chronic kidney disease: Secondary | ICD-10-CM | POA: Diagnosis not present

## 2020-09-28 DIAGNOSIS — L89620 Pressure ulcer of left heel, unstageable: Secondary | ICD-10-CM | POA: Diagnosis not present

## 2020-09-28 DIAGNOSIS — I872 Venous insufficiency (chronic) (peripheral): Secondary | ICD-10-CM | POA: Diagnosis not present

## 2020-09-28 DIAGNOSIS — D631 Anemia in chronic kidney disease: Secondary | ICD-10-CM | POA: Diagnosis not present

## 2020-09-28 DIAGNOSIS — I5022 Chronic systolic (congestive) heart failure: Secondary | ICD-10-CM | POA: Diagnosis not present

## 2020-09-28 DIAGNOSIS — E1151 Type 2 diabetes mellitus with diabetic peripheral angiopathy without gangrene: Secondary | ICD-10-CM | POA: Diagnosis not present

## 2020-09-29 DIAGNOSIS — L89620 Pressure ulcer of left heel, unstageable: Secondary | ICD-10-CM | POA: Diagnosis not present

## 2020-09-30 ENCOUNTER — Encounter (HOSPITAL_BASED_OUTPATIENT_CLINIC_OR_DEPARTMENT_OTHER): Payer: Medicare PPO | Admitting: Physician Assistant

## 2020-10-01 ENCOUNTER — Other Ambulatory Visit (HOSPITAL_COMMUNITY): Payer: Self-pay

## 2020-10-01 DIAGNOSIS — L89620 Pressure ulcer of left heel, unstageable: Secondary | ICD-10-CM | POA: Diagnosis not present

## 2020-10-01 NOTE — Progress Notes (Signed)
Paramedicine Encounter    Patient ID: Andrew [name], male    DOB: October 15, 1928, 86 y.o.   MRN: [id number]   Patient Care Team: [name], MD as PCP - General (Family Medicine) Larey Dresser, MD as PCP - Advanced Heart Failure (Cardiology) Larey Dresser, MD as Consulting Physician (Cardiology) Jorge Ny, LCSW as Social Worker (Licensed Clinical Social Worker) Gardiner Barefoot, DPM as Consulting Physician (Podiatry) [name], MD (Family Medicine) Patient, No Pcp Per (General Practice)  Patient Active Problem List   Diagnosis Date Noted   Acute respiratory failure with hypercapnia ([place]) 93/26/7124   Acute metabolic encephalopathy 58/08/9832   CKD (chronic kidney disease), stage IV ([place]) 06/02/2020   PAF (paroxysmal atrial fibrillation) ([place]) 06/02/2020   Hypothyroidism 06/02/2020   Chronic anticoagulation 06/02/2020   Debility 06/01/2020   Lumbar spondylosis 10/04/2018   Chronic respiratory failure with hypoxia ([name]) 09/16/2018   Pain of right thumb 06/19/2018   Bilateral recurrent inguinal hernias 11/28/2017   Carotid artery stenosis 82/50/5397   Chronic systolic CHF (congestive heart failure) ([place]) 12/02/2015   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 ([place]) 10/16/2015   PVD (peripheral vascular disease) ([place])    Hereditary and idiopathic peripheral neuropathy 10/02/2015   Peripelvic (lymphatic) cyst    Pernicious anemia 07/15/2015   Bilateral renal cysts 07/12/2015   Lung nodule, 58mm RML CT 07/12/15 07/12/2015   Stage 3 chronic kidney disease ([place]) 06/14/2015   Paroxysmal atrial fibrillation ([place]) 05/28/2015   AAA (abdominal aortic aneurysm) without rupture ([place]) 09/30/2014   Cardiomyopathy, ischemic 05/23/2014   DM (diabetes mellitus), type 2 with renal complications ([place]) 67/34/1937   Depression, major, single episode, complete remission ([place]) 03/18/2010   Hypothyroidism 02/03/2009   Osteoarthritis 05/27/2008    Hyperlipidemia associated with type 2 diabetes mellitus ([place]) 06/14/2007   Hypertension associated with diabetes ([place]) 06/14/2007   GERD 06/14/2007    Current Outpatient Medications:    acetaminophen (TYLENOL) 500 MG tablet, Take 2 tablets (1,000 mg total) by mouth every 8 (eight) hours as needed., Disp: 30 tablet, Rfl: 0   amiodarone (PACERONE) 200 MG tablet, Take 100 mg by mouth daily., Disp: , Rfl:    apixaban (ELIQUIS) 2.5 MG TABS tablet, Take 2.5 mg by mouth 2 (two) times daily. , Disp: , Rfl:    atorvastatin (LIPITOR) 80 MG tablet, Take 80 mg by mouth daily., Disp: , Rfl:    carvedilol (COREG) 12.5 MG tablet, Take 12.5 mg by mouth 2 (two) times daily with a meal., Disp: , Rfl:    cilostazol (PLETAL) 100 MG tablet, Take 1 tablet (100 mg total) by mouth 2 (two) times daily., Disp: 180 tablet, Rfl: 3   ezetimibe (ZETIA) 10 MG tablet, TAKE (1) TABLET DAILY., Disp: 90 tablet, Rfl: 0   FLUoxetine (PROZAC) 20 MG capsule, TAKE (1) CAPSULE DAILY., Disp: 90 capsule, Rfl: 0   gabapentin (NEURONTIN) 100 MG capsule, Take 100 mg by mouth 3 (three) times daily., Disp: , Rfl:    hydrALAZINE (APRESOLINE) 50 MG tablet, Take 50 mg by mouth 3 (three) times daily., Disp: , Rfl:    isosorbide mononitrate (IMDUR) 30 MG 24 hr tablet, Take 30 mg by mouth daily., Disp: , Rfl:    levothyroxine (SYNTHROID) 100 MCG tablet, TAKE 1 TABLET BEFORE BREAKFAST., Disp: 90 tablet, Rfl: 0   Multiple Vitamins-Minerals (MULTIVITAMIN ADULTS 50+) TABS, Take 1 tablet by mouth daily., Disp: , Rfl:    pantoprazole (PROTONIX) 40 MG tablet, Take 1 tablet (40 mg  total) by mouth 2 (two) times daily., Disp: , Rfl:    potassium chloride SA (KLOR-CON) 20 MEQ tablet, Take 20 mEq by mouth daily., Disp: , Rfl:    tiotropium (SPIRIVA) 18 MCG inhalation capsule, Place 18 mcg into inhaler and inhale daily as needed (for wheezing)., Disp: , Rfl:    torsemide (DEMADEX) 20 MG tablet, Take 4 tablets (80 mg total) by mouth 2 (two)  times daily., Disp: 240 tablet, Rfl: 3   glucose blood (ACCU-CHEK AVIVA PLUS) test strip, CHECK BLOOD SUGAR 4 TIMES A DAY., Disp: 100 each, Rfl: 11   HYDROcodone-acetaminophen (NORCO/VICODIN) 5-325 MG tablet, TAKE 1 TABLET EVERY 6 HOURS AS NEEDED FOR MODERATE PAIN. (Patient not taking: Reported on 10/01/2020), Disp: 12 tablet, Rfl: 0   metolazone (ZAROXOLYN) 2.5 MG tablet, Take 1 tablet (2.5 mg total) by mouth as directed. By HF Clinic (Patient not taking: Reported on 09/17/2020), Disp: 3 tablet, Rfl: 0   nitroGLYCERIN (NITROSTAT) 0.4 MG SL tablet, DISSOLVE 1 TABLET UNDER TONGUE AS NEEDED FOR CHEST PAIN,MAY REPEAT IN5 MINUTES FOR 2 DOSES. (Patient not taking: Reported on 09/17/2020), Disp: 25 tablet, Rfl: 3 Allergies  Allergen Reactions   Metoprolol Shortness Of Breath, Palpitations and Other (See Comments)    Heart starts racing. Shallow breathing; pt states he also get skin irritation on his legs   Metformin Hives and Swelling    On legs   Metformin And Related Nausea And Vomiting      Social History   Socioeconomic History   Marital status: Married    Spouse name: Not on file   Number of children: Not on file   Years of education: Not on file   Highest education level: Not on file  Occupational History   Occupation: Retired  Tobacco Use   Smoking status: Never Smoker   Smokeless tobacco: Never Used   Tobacco comment: quit atleast 25 yrs ago, per pt  Vaping Use   Vaping Use: Never used  Substance and Sexual Activity   Alcohol use: Never    Comment: socially   Drug use: Never   Sexual activity: Not Currently  Other Topics Concern   Not on file  Social History Narrative   ** Merged History Encounter **       Pt lives in [place] with spouse. Retired from [employer].  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   Social Determinants of Health   Financial Resource Strain:    Difficulty of Paying Living  Expenses: Not on file  Food Insecurity:    Worried About Charity fundraiser in the Last Year: Not on file   [employer] of Food in the Last Year: Not on file  Transportation Needs:    Lack of Transportation (Medical): Not on file   Lack of Transportation (Non-Medical): Not on file  Physical Activity:    Days of Exercise per Week: Not on file   Minutes of Exercise per Session: Not on file  Stress:    Feeling of Stress : Not on file  Social Connections:    Frequency of Communication with Friends and Family: Not on file   Frequency of Social Gatherings with Friends and Family: Not on file   Attends Religious Services: Not on file   Active Member of Clubs or Organizations: Not on file   Attends Archivist Meetings: Not on file   Marital Status: Not on file  Intimate Partner Violence:    Fear of Current or  Ex-Partner: Not on file   Emotionally Abused: Not on file   Physically Abused: Not on file   Sexually Abused: Not on file    Physical Exam Cardiovascular:     Rate and Rhythm: Normal rate and regular rhythm.     Pulses: Normal pulses.  Pulmonary:     Effort: Pulmonary effort is normal.     Breath sounds: Normal breath sounds.  Musculoskeletal:        General: Normal range of motion.  Skin:    General: Skin is warm and dry.     Capillary Refill: Capillary refill takes less than 2 seconds.  Neurological:     Mental Status: He is alert and oriented to person, place, and time.  Psychiatric:        Mood and Affect: Mood normal.         Future Appointments  Date Time Provider [place]  10/02/2020  1:00 PM Ricard Dillon, MD [facility]  10/06/2020  3:00 PM Larey Dresser, MD [facility] None  10/15/2020  2:00 PM [facility]  11/03/2020  2:20 PM Larey Dresser, MD [facility] None    BP (!) 106/50 (BP Location: Left Arm, Patient Position: Sitting, Cuff Size: Normal)    Pulse 64    Resp 16    Wt 175 lb 6.4 oz (79.6 kg)    SpO2  91%    BMI 27.47 kg/m   Weight yesterday- did not weigh Last visit weight- 182 lb  Andrew [name] was seen at home today and reported feeling well. He denied chest pain, SOB, headache, dizziness, orthopnea, fever or cough over the past week. He stated he has been compliant with his medications and his weight has trended down. His legs were wrapped for wound care so I was unable to assess for edema but he did not appear to have excess fluid. His medications were verified and his pillbox was refilled. I will follow up next week.   [name], EMT 10/01/20  ACTION: Home visit completed Next visit planned for 1 week

## 2020-10-02 ENCOUNTER — Encounter (HOSPITAL_BASED_OUTPATIENT_CLINIC_OR_DEPARTMENT_OTHER): Payer: Medicare PPO | Attending: Internal Medicine | Admitting: Internal Medicine

## 2020-10-02 DIAGNOSIS — E11621 Type 2 diabetes mellitus with foot ulcer: Secondary | ICD-10-CM | POA: Diagnosis not present

## 2020-10-02 DIAGNOSIS — L97821 Non-pressure chronic ulcer of other part of left lower leg limited to breakdown of skin: Secondary | ICD-10-CM | POA: Diagnosis not present

## 2020-10-02 DIAGNOSIS — I87333 Chronic venous hypertension (idiopathic) with ulcer and inflammation of bilateral lower extremity: Secondary | ICD-10-CM | POA: Insufficient documentation

## 2020-10-02 DIAGNOSIS — L97811 Non-pressure chronic ulcer of other part of right lower leg limited to breakdown of skin: Secondary | ICD-10-CM | POA: Insufficient documentation

## 2020-10-02 DIAGNOSIS — L97422 Non-pressure chronic ulcer of left heel and midfoot with fat layer exposed: Secondary | ICD-10-CM | POA: Insufficient documentation

## 2020-10-02 DIAGNOSIS — Z86718 Personal history of other venous thrombosis and embolism: Secondary | ICD-10-CM | POA: Diagnosis not present

## 2020-10-05 DIAGNOSIS — I130 Hypertensive heart and chronic kidney disease with heart failure and stage 1 through stage 4 chronic kidney disease, or unspecified chronic kidney disease: Secondary | ICD-10-CM | POA: Diagnosis not present

## 2020-10-05 DIAGNOSIS — E1151 Type 2 diabetes mellitus with diabetic peripheral angiopathy without gangrene: Secondary | ICD-10-CM | POA: Diagnosis not present

## 2020-10-05 DIAGNOSIS — E1122 Type 2 diabetes mellitus with diabetic chronic kidney disease: Secondary | ICD-10-CM | POA: Diagnosis not present

## 2020-10-05 DIAGNOSIS — N184 Chronic kidney disease, stage 4 (severe): Secondary | ICD-10-CM | POA: Diagnosis not present

## 2020-10-05 DIAGNOSIS — L89620 Pressure ulcer of left heel, unstageable: Secondary | ICD-10-CM | POA: Diagnosis not present

## 2020-10-05 DIAGNOSIS — I872 Venous insufficiency (chronic) (peripheral): Secondary | ICD-10-CM | POA: Diagnosis not present

## 2020-10-05 DIAGNOSIS — D631 Anemia in chronic kidney disease: Secondary | ICD-10-CM | POA: Diagnosis not present

## 2020-10-05 DIAGNOSIS — E1169 Type 2 diabetes mellitus with other specified complication: Secondary | ICD-10-CM | POA: Diagnosis not present

## 2020-10-05 DIAGNOSIS — I5022 Chronic systolic (congestive) heart failure: Secondary | ICD-10-CM | POA: Diagnosis not present

## 2020-10-05 NOTE — Progress Notes (Signed)
CYRUS, RAMSBURG ([id number]) Visit Report for 10/02/2020 Debridement Details Patient Name: Date of Service: Andrew [name], [name] 10/02/2020 1:00 PM Medical Record Number: [id number] Patient Account Number: [id number] Date of Birth/Sex: Treating RN: March 21, 1928 (86 y.o. [name] Primary Care Provider: Dimas Chyle Other Clinician: Referring Provider: Treating Provider/Extender: Donalee Citrin in Treatment: 3 Debridement Performed for Assessment: Wound #2 Left Calcaneus Performed By: Physician Ricard Dillon., MD Debridement Type: Debridement Severity of Tissue Pre Debridement: Fat layer exposed Level of Consciousness (Pre-procedure): Awake and Alert Pre-procedure Verification/Time Out Yes - 14:30 Taken: Start Time: 14:30 Pain Control: Other : benzocaine, 20% T Area Debrided (L x W): otal 1.5 (cm) x 3 (cm) = 4.5 (cm) Tissue and other material debrided: Viable, Non-Viable, Subcutaneous Level: Skin/Subcutaneous Tissue Debridement Description: Excisional Instrument: Curette Bleeding: Minimum Hemostasis Achieved: Pressure End Time: 14:30 Procedural Pain: 0 Post Procedural Pain: 0 Response to Treatment: Procedure was tolerated well Level of Consciousness (Post- Awake and Alert procedure): Post Debridement Measurements of Total Wound Length: (cm) 1.5 Width: (cm) 3 Depth: (cm) 0.5 Volume: (cm) 1.767 Character of Wound/Ulcer Post Debridement: Improved Severity of Tissue Post Debridement: Fat layer exposed Post Procedure Diagnosis Same as Pre-procedure Electronic Signature(s) Signed: 10/02/2020 5:35:06 PM By: Kela Millin Signed: 10/05/2020 5:03:49 PM By: Linton Ham MD Entered By: Linton Ham on 10/02/2020 15:00:51 -------------------------------------------------------------------------------- HPI Details Patient Name: Date of Service: Andrew [name], [name] 10/02/2020 1:00 PM Medical Record Number: [id number] Patient Account  Number: [id number] Date of Birth/Sex: Treating RN: 13-Mar-1928 (86 y.o. [name] Primary Care Provider: Dimas Chyle Other Clinician: Referring Provider: Treating Provider/Extender: Donalee Citrin in Treatment: 3 History of Present Illness HPI Description: 09/11/2020 ADMISSION This is a soon-to-be 86 year old man who was hospitalized in June and subsequently spent time at [facility]. At some point he developed a pressure ulcer on his left heel. On discharge he was seen by podiatry and referred to Dr. Donzetta Matters with regards to the possibility of PAD. The patient underwent angiography on 08/10/2020 this showed heavily calcified vessels. The common iliac artery on the left appeared to have calcific disease but there was no gradient. Left common external leg arteries were heavily tortuous left lower extremity limited angiography developed demonstrated a SFA to be helped heavily calcified throughout with multiple greater than 50% stenosis but never frankly occludes he was not felt to be a candidate for endovascular repair. I think they had a minimal look at the right side and it appeared that the SFA was occluded and reconstitutes above the knee. The overall feeling was that if he could not heal his left heel ulcer he will eventually best be served by a left above-knee amputation The last I can see is that he should have been using a Santyl wet-to-dry dressing to the heel. During his stay with podiatry was also noted that he had venous insufficiency and I think venous insufficiency wounds. His wife tells me that he has been recommended for compression stockings in the past but the patient refused, he has wounds on his bilateral anterior lower legs right greater than the left. Chronic venous inflammation and dry flaking skin. Past medical history; heart failure, right heel wound, paroxysmal atrial fibrillation on Eliquis, abdominal aortic aneurysm with previous  repair, COPD, DVT, melanoma on his back, diabetes with a recent hemoglobin A1c of 7.3 however he is not on any treatment [type II], PAD, coronary artery disease, hypertension The patient's most recent arterial studies were  on 05/28/2020. This showed an ABI on the right at 1.12 but monophasic waveforms and absent waveforms at the posterior tibial artery. On the left his ABI was 1.47 [noncompressible] he had triphasic waveforms at the ATA and monophasic at the PTA. His TBI on the right was 1.12 on the left and 0.97. It was felt his ABIs were falsely elevated 9/24. The patient followed up with Dr. Donzetta Matters and had noninvasive arterial studies. On the right his ABIs were noncompressible monophasic waveforms with a TBI of 1.45 and a right great toe pressure of 172. On the left he had monophasic waveforms with noncompressible ABIs TBI of 0.79 and a left great toe pressure of 94. He will be seen in follow-up by Dr. Donzetta Matters in a month's time. The patient also has very significant chronic venous insufficiency 10/8; patient with known PAD. He came in with bilateral lower extremity wounds likely secondary to venous stasis. Still has one on the upper right tibial area. The area on the left leg is healed however he still has an area on the left heel. This needed an extensive debridement today. His wife brings in notes apparently Dr. March Rummage ordered an MRI of the left heel. I was unaware of this. I was not aware that he did not been seen by Dr. March Rummage and I do not have any records of this. Apparently MRI called to set up the appointment. I will need to look through epic on this Electronic Signature(s) Signed: 10/05/2020 5:03:49 PM By: Linton Ham MD Entered By: Linton Ham on 10/02/2020 15:04:19 -------------------------------------------------------------------------------- Physical Exam Details Patient Name: Date of Service: Andrew [name], [name] 10/02/2020 1:00 PM Medical Record Number: [id number] Patient Account  Number: [id number] Date of Birth/Sex: Treating RN: 11/21/1928 (86 y.o. [name] Primary Care Provider: Dimas Chyle Other Clinician: Referring Provider: Treating Provider/Extender: Donalee Citrin in Treatment: 3 Constitutional Sitting or standing Blood Pressure is within target range for patient.. Pulse regular and within target range for patient.Marland Kitchen Respirations regular, non-labored and within target range.. Temperature is normal and within the target range for the patient.Marland Kitchen Appears in no distress. Notes Wound exam Left heel tip #5 curette again an aggressive debridement of very adherent necrotic material. Hemostasis with direct pressure Left leg did not have anything open on the right upper anterior there is an area still open but superficial Electronic Signature(s) Signed: 10/05/2020 5:03:49 PM By: Linton Ham MD Entered By: Linton Ham on 10/02/2020 15:02:50 -------------------------------------------------------------------------------- Physician Orders Details Patient Name: Date of Service: Andrew [name], [name] 10/02/2020 1:00 PM Medical Record Number: [id number] Patient Account Number: [id number] Date of Birth/Sex: Treating RN: 1928-03-13 (86 y.o. [name] Primary Care Provider: Dimas Chyle Other Clinician: Referring Provider: Treating Provider/Extender: Donalee Citrin in Treatment: 3 Verbal / Phone Orders: No Diagnosis Coding ICD-10 Coding Code Description E11.621 Type 2 diabetes mellitus with foot ulcer L97.422 Non-pressure chronic ulcer of left heel and midfoot with fat layer exposed I87.333 Chronic venous hypertension (idiopathic) with ulcer and inflammation of bilateral lower extremity L97.811 Non-pressure chronic ulcer of other part of right lower leg limited to breakdown of skin L97.821 Non-pressure chronic ulcer of other part of left lower leg limited to breakdown of skin Follow-up  Appointments Return Appointment in 1 week. Dressing Change Frequency Other: - HH to change twice a week on weeks that patient is not coming to wound care clinic Skin Barriers/Peri-Wound Care Moisturizing lotion TCA Cream or Ointment Wound Cleansing May shower with protection. -  cast protector Primary Wound Dressing Wound #2 Left Calcaneus Iodoflex Wound #3 Right Lower Leg Calcium Alginate with Silver Secondary Dressing ABD pad Zetuvit or Kerramax Heel Cup - on left heel Edema Control Kerlix and Coban - Bilateral Avoid standing for long periods of time Elevate legs to the level of the heart or above for 30 minutes daily and/or when sitting, a frequency of: Exercise regularly [facility] for wound care. - [facility] Electronic Signature(s) Signed: 10/02/2020 5:35:06 PM By: Kela Millin Signed: 10/05/2020 5:03:49 PM By: Linton Ham MD Entered By: Kela Millin on 10/02/2020 14:35:11 -------------------------------------------------------------------------------- Problem List Details Patient Name: Date of Service: Andrew [name], [name] 10/02/2020 1:00 PM Medical Record Number: [id number] Patient Account Number: [id number] Date of Birth/Sex: Treating RN: Dec 28, 1927 (86 y.o. [name] Primary Care Provider: Dimas Chyle Other Clinician: Referring Provider: Treating Provider/Extender: Donalee Citrin in Treatment: 3 Active Problems ICD-10 Encounter Code Description Active Date MDM Diagnosis E11.621 Type 2 diabetes mellitus with foot ulcer 09/11/2020 No Yes L97.422 Non-pressure chronic ulcer of left heel and midfoot with fat layer exposed 09/11/2020 No Yes I87.333 Chronic venous hypertension (idiopathic) with ulcer and inflammation of 09/11/2020 No Yes bilateral lower extremity L97.811 Non-pressure chronic ulcer of other part of right lower leg limited to breakdown 09/11/2020 No Yes of skin Inactive  Problems ICD-10 Code Description Active Date Inactive Date L97.821 Non-pressure chronic ulcer of other part of left lower leg limited to breakdown of skin 09/11/2020 09/11/2020 Resolved Problems Electronic Signature(s) Signed: 10/05/2020 5:03:49 PM By: Linton Ham MD Entered By: Linton Ham on 10/02/2020 15:00:21 -------------------------------------------------------------------------------- Progress Note Details Patient Name: Date of Service: Andrew [name], [name] 10/02/2020 1:00 PM Medical Record Number: [id number] Patient Account Number: [id number] Date of Birth/Sex: Treating RN: [birth year] (86 y.o. [name] Primary Care Provider: Dimas Chyle Other Clinician: Referring Provider: Treating Provider/Extender: Donalee Citrin in Treatment: 3 Subjective History of Present Illness (HPI) 09/11/2020 ADMISSION This is a soon-to-be 86 year old man who was hospitalized in June and subsequently spent time at [facility]. At some point he developed a pressure ulcer on his left heel. On discharge he was seen by podiatry and referred to Dr. Donzetta Matters with regards to the possibility of PAD. The patient underwent angiography on 08/10/2020 this showed heavily calcified vessels. The common iliac artery on the left appeared to have calcific disease but there was no gradient. Left common external leg arteries were heavily tortuous left lower extremity limited angiography developed demonstrated a SFA to be helped heavily calcified throughout with multiple greater than 50% stenosis but never frankly occludes he was not felt to be a candidate for endovascular repair. I think they had a minimal look at the right side and it appeared that the SFA was occluded and reconstitutes above the knee. The overall feeling was that if he could not heal his left heel ulcer he will eventually best be served by a left above-knee amputation The last I can see is that he should  have been using a Santyl wet-to-dry dressing to the heel. During his stay with podiatry was also noted that he had venous insufficiency and I think venous insufficiency wounds. His wife tells me that he has been recommended for compression stockings in the past but the patient refused, he has wounds on his bilateral anterior lower legs right greater than the left. Chronic venous inflammation and dry flaking skin. Past medical history; heart failure, right heel wound, paroxysmal  atrial fibrillation on Eliquis, abdominal aortic aneurysm with previous repair, COPD, DVT, melanoma on his back, diabetes with a recent hemoglobin A1c of 7.3 however he is not on any treatment [type II], PAD, coronary artery disease, hypertension The patient's most recent arterial studies were on 05/28/2020. This showed an ABI on the right at 1.12 but monophasic waveforms and absent waveforms at the posterior tibial artery. On the left his ABI was 1.47 [noncompressible] he had triphasic waveforms at the ATA and monophasic at the PTA. His TBI on the right was 1.12 on the left and 0.97. It was felt his ABIs were falsely elevated 9/24. The patient followed up with Dr. Donzetta Matters and had noninvasive arterial studies. On the right his ABIs were noncompressible monophasic waveforms with a TBI of 1.45 and a right great toe pressure of 172. On the left he had monophasic waveforms with noncompressible ABIs TBI of 0.79 and a left great toe pressure of 94. He will be seen in follow-up by Dr. Donzetta Matters in a month's time. The patient also has very significant chronic venous insufficiency 10/8; patient with known PAD. He came in with bilateral lower extremity wounds likely secondary to venous stasis. Still has one on the upper right tibial area. The area on the left leg is healed however he still has an area on the left heel. This needed an extensive debridement today. His wife brings in notes apparently Dr. March Rummage ordered an MRI of the left heel. I was  unaware of this. I was not aware that he did not been seen by Dr. March Rummage and I do not have any records of this. Apparently MRI called to set up the appointment. I will need to look through epic on this Objective Constitutional Sitting or standing Blood Pressure is within target range for patient.. Pulse regular and within target range for patient.Marland Kitchen Respirations regular, non-labored and within target range.. Temperature is normal and within the target range for the patient.Marland Kitchen Appears in no distress. Vitals Time Taken: 1:47 PM, Height: 67 in, Weight: 185 lbs, BMI: 29, Temperature: 97.6 F, Pulse: 63 bpm, Respiratory Rate: 18 breaths/min, Blood Pressure: 112/61 mmHg. General Notes: Wound exam ooLeft heel tip #5 curette again an aggressive debridement of very adherent necrotic material. Hemostasis with direct pressure ooLeft leg did not have anything open on the right upper anterior there is an area still open but superficial Integumentary (Hair, Skin) Wound #1 status is Open. Original cause of wound was Gradually Appeared. The wound is located on the Left,Anterior Lower Leg. The wound measures 0cm length x 0cm width x 0cm depth; 0cm^2 area and 0cm^3 volume. There is no tunneling or undermining noted. There is a none present amount of drainage noted. The wound margin is flat and intact. There is no granulation within the wound bed. There is no necrotic tissue within the wound bed. Wound #2 status is Open. Original cause of wound was Gradually Appeared. The wound is located on the Left Calcaneus. The wound measures 1.5cm length x 3cm width x 0.5cm depth; 3.534cm^2 area and 1.767cm^3 volume. There is Fat Layer (Subcutaneous Tissue) exposed. There is no tunneling or undermining noted. There is a medium amount of serosanguineous drainage noted. The wound margin is flat and intact. There is medium (34-66%) pink granulation within the wound bed. There is a medium (34-66%) amount of necrotic tissue within the  wound bed including Adherent Slough. Wound #3 status is Open. Original cause of wound was Gradually Appeared. The wound is located on the Right  Lower Leg. The wound measures 1.1cm length x 0.5cm width x 0.1cm depth; 0.432cm^2 area and 0.043cm^3 volume. There is Fat Layer (Subcutaneous Tissue) exposed. There is no tunneling or undermining noted. There is a small amount of serosanguineous drainage noted. The wound margin is flat and intact. There is large (67-100%) red granulation within the wound bed. There is no necrotic tissue within the wound bed. Assessment Active Problems ICD-10 Type 2 diabetes mellitus with foot ulcer Non-pressure chronic ulcer of left heel and midfoot with fat layer exposed Chronic venous hypertension (idiopathic) with ulcer and inflammation of bilateral lower extremity Non-pressure chronic ulcer of other part of right lower leg limited to breakdown of skin Procedures Wound #2 Pre-procedure diagnosis of Wound #2 is a Diabetic Wound/Ulcer of the Lower Extremity located on the Left Calcaneus .Severity of Tissue Pre Debridement is: Fat layer exposed. There was a Excisional Skin/Subcutaneous Tissue Debridement with a total area of 4.5 sq cm performed by Ricard Dillon., MD. With the following instrument(s): Curette to remove Viable and Non-Viable tissue/material. Material removed includes Subcutaneous Tissue after achieving pain control using Other (benzocaine, 20%). No specimens were taken. A time out was conducted at 14:30, prior to the start of the procedure. A Minimum amount of bleeding was controlled with Pressure. The procedure was tolerated well with a pain level of 0 throughout and a pain level of 0 following the procedure. Post Debridement Measurements: 1.5cm length x 3cm width x 0.5cm depth; 1.767cm^3 volume. Character of Wound/Ulcer Post Debridement is improved. Severity of Tissue Post Debridement is: Fat layer exposed. Post procedure Diagnosis Wound #2: Same as  Pre-Procedure Plan Follow-up Appointments: Return Appointment in 1 week. Dressing Change Frequency: Other: - HH to change twice a week on weeks that patient is not coming to wound care clinic Skin Barriers/Peri-Wound Care: Moisturizing lotion TCA Cream or Ointment Wound Cleansing: May shower with protection. - cast protector Primary Wound Dressing: Wound #2 Left Calcaneus: Iodoflex Wound #3 Right Lower Leg: Calcium Alginate with Silver Secondary Dressing: ABD pad Zetuvit or Kerramax Heel Cup - on left heel Edema Control: Kerlix and Coban - Bilateral Avoid standing for long periods of time Elevate legs to the level of the heart or above for 30 minutes daily and/or when sitting, a frequency of: Exercise regularly Home Health: [facility] for wound care. - [facility] 1. After examining the wound on the heel he has a significant amount of periwound tenderness therefore I think the MRI is probably indicated. I will try to look through epic to see if I can find the pathway here. 2. As usual after debridement the heel looked quite healthy although it really deteriorated after last weeks debridement 3. We continued with Iodoflex to the left heel silver alginate to the right lower leg 4. We continued with Kerlix and Coban bilaterally. 5. We will need to make arrangements for 20/30 below-knee stockings at some point Electronic Signature(s) Signed: 10/05/2020 5:03:49 PM By: Linton Ham MD Entered By: Linton Ham on 10/02/2020 15:06:03 -------------------------------------------------------------------------------- SuperBill Details Patient Name: Date of Service: Andrew [name], [name] 10/02/2020 Medical Record Number: [id number] Patient Account Number: [id number] Date of Birth/Sex: Treating RN: 08/10/1928 (86 y.o. [name] Primary Care Provider: Dimas Chyle Other Clinician: Referring Provider: Treating Provider/Extender: Donalee Citrin in Treatment: 3 Diagnosis Coding ICD-10 Codes Code Description [id number] Type 2 diabetes mellitus with foot ulcer L97.422 Non-pressure chronic ulcer of left heel and midfoot with fat layer exposed I87.333 Chronic venous  hypertension (idiopathic) with ulcer and inflammation of bilateral lower extremity L97.811 Non-pressure chronic ulcer of other part of right lower leg limited to breakdown of skin L97.821 Non-pressure chronic ulcer of other part of left lower leg limited to breakdown of skin Facility Procedures CPT4 Code: [id number] Description: 89784 - DEB SUBQ TISSUE 20 SQ CM/< ICD-10 Diagnosis Description L97.422 Non-pressure chronic ulcer of left heel and midfoot with fat layer exposed Modifier: Quantity: 1 Physician Procedures : CPT4 Code Description Modifier [id number] 11042 - WC PHYS SUBQ TISS 20 SQ CM ICD-10 Diagnosis Description L97.422 Non-pressure chronic ulcer of left heel and midfoot with fat layer exposed Quantity: 1 Electronic Signature(s) Signed: 10/05/2020 5:03:49 PM By: Linton Ham MD Entered By: Linton Ham on 10/02/2020 15:06:15

## 2020-10-06 ENCOUNTER — Encounter (HOSPITAL_COMMUNITY): Payer: Medicare PPO | Admitting: Cardiology

## 2020-10-06 NOTE — Progress Notes (Signed)
AYODEJI, KEIMIG ([id number]) Visit Report for 10/02/2020 Arrival Information Details Patient Name: Date of Service: [name], [name] 10/02/2020 1:00 PM Medical Record Number: [id number] Patient Account Number: [id number] Date of Birth/Sex: Treating RN: 10/29/1928 (86 y.o. [name] Primary Care Lenzie Montesano: Dimas Chyle Other Clinician: Referring Manjot Beumer: Treating Retaj Hilbun/Extender: Donalee Citrin in Treatment: 3 Visit Information History Since Last Visit Added or deleted any medications: No Patient Arrived: Ambulatory Any new allergies or adverse reactions: No Arrival Time: 13:42 Had a fall or experienced change in No Accompanied By: wife activities of daily living that may affect Transfer Assistance: None risk of falls: Patient Identification Verified: Yes Signs or symptoms of abuse/neglect since last visito No Secondary Verification Process Completed: Yes Hospitalized since last visit: No Patient Requires Transmission-Based Precautions: No Implantable device outside of the clinic excluding No Patient Has Alerts: No cellular tissue based products placed in the center since last visit: Has Dressing in Place as Prescribed: Yes Pain Present Now: Yes Electronic Signature(s) Signed: 10/06/2020 9:19:13 AM By: Sandre Kitty Entered By: Sandre Kitty on 10/02/2020 13:47:34 -------------------------------------------------------------------------------- Encounter Discharge Information Details Patient Name: Date of Service: [name], [name] 10/02/2020 1:00 PM Medical Record Number: [id number] Patient Account Number: [id number] Date of Birth/Sex: Treating RN: 08-10-1928 (86 y.o. [name] Primary Care Bakary Bramer: Dimas Chyle Other Clinician: Referring Zacharie Portner: Treating Jaylina Ramdass/Extender: Donalee Citrin in Treatment: 3 Encounter Discharge Information Items Post Procedure Vitals Discharge Condition:  Stable Temperature (F): 97.6 Ambulatory Status: Wheelchair Pulse (bpm): 63 Discharge Destination: Home Respiratory Rate (breaths/min): 18 Transportation: Private Auto Blood Pressure (mmHg): 112/61 Accompanied By: wife Schedule Follow-up Appointment: Yes Clinical Summary of Care: Electronic Signature(s) Signed: 10/02/2020 5:50:34 PM By: Deon Pilling Entered By: Deon Pilling on 10/02/2020 15:46:18 -------------------------------------------------------------------------------- Lower Extremity Assessment Details Patient Name: Date of Service: [name], [name] 10/02/2020 1:00 PM Medical Record Number: [id number] Patient Account Number: [id number] Date of Birth/Sex: Treating RN: 18-Mar-1928 (86 y.o. [name] Primary Care Uriel Dowding: Dimas Chyle Other Clinician: Referring Westen Dinino: Treating Cranston Koors/Extender: Donalee Citrin in Treatment: 3 Edema Assessment Assessed: [Left: No] [Right: No] Edema: [Left: Yes] [Right: Yes] Calf Left: Right: Point of Measurement: 44 cm From Medial Instep 30.5 cm 30.5 cm Ankle Left: Right: Point of Measurement: 10 cm From Medial Instep 21.1 cm 20.7 cm Vascular Assessment Pulses: Dorsalis Pedis Palpable: [Left:Yes] [Right:Yes] Electronic Signature(s) Signed: 10/02/2020 5:42:53 PM By: Baruch Gouty RN, BSN Entered By: Baruch Gouty on 10/02/2020 14:08:48 -------------------------------------------------------------------------------- Multi Wound Chart Details Patient Name: Date of Service: [name], [name] 10/02/2020 1:00 PM Medical Record Number: [id number] Patient Account Number: [id number] Date of Birth/Sex: Treating RN: December 27, 1927 (86 y.o. [name] Primary Care Muskan Bolla: Dimas Chyle Other Clinician: Referring Golden Gilreath: Treating Abiel Antrim/Extender: Donalee Citrin in Treatment: 3 Vital Signs Height(in): 63 Pulse(bpm): 47 Weight(lbs): 185 Blood Pressure(mmHg):  112/61 Body Mass Index(BMI): 29 Temperature(F): 97.6 Respiratory Rate(breaths/min): 18 Photos: [1:No Photos Left, Anterior Lower Leg] [2:No Photos Left Calcaneus] [3:No Photos Right Lower Leg] Wound Location: [1:Gradually Appeared] [2:Gradually Appeared] [3:Gradually Appeared] Wounding Event: [1:Venous Leg Ulcer] [2:Diabetic Wound/Ulcer of the Lower] [3:Venous Leg Ulcer] Primary Etiology: [1:Congestive Heart Failure, Coronary Congestive Heart Failure, Coronary] [2:Extremity] [3:Congestive Heart Failure, Coronary] Comorbid History: [1:Artery Disease, Hypertension, Peripheral Arterial Disease, Peripheral Peripheral Arterial Disease, Peripheral Venous Disease, Type II Diabetes 06/25/2020] [2:Artery Disease, Hypertension, Venous Disease, Type II Diabetes 05/26/2020]  [3:Artery Disease, Hypertension, Peripheral Arterial Disease, Peripheral Venous Disease, Type II Diabetes  06/25/2020] Date Acquired: [1:3] [2:3] [3:3] Weeks of Treatment: [1:Open] [2:Open] [3:Open] Wound Status: [1:0x0x0] [2:1.5x3x0.5] [3:1.1x0.5x0.1] Measurements L x W x D (cm) [1:0] [2:3.534] [3:0.432] A (cm) : rea [1:0] [2:1.767] [3:0.043] Volume (cm) : [1:100.00%] [2:47.10%] [3:99.70%] % Reduction in Area: [1:100.00%] [2:-164.50%] [3:99.90%] % Reduction in Volume: [1:Full Thickness Without Exposed] [2:Grade 2] [3:Full Thickness Without Exposed] Classification: [1:Support Structures None Present] [2:Medium] [3:Support Structures Small] Exudate A mount: [1:N/A] [2:Serosanguineous] [3:Serosanguineous] Exudate Type: [1:N/A] [2:red, brown] [3:red, brown] Exudate Color: [1:Flat and Intact] [2:Flat and Intact] [3:Flat and Intact] Wound Margin: [1:None Present (0%)] [2:Medium (34-66%)] [3:Large (67-100%)] Granulation A mount: [1:N/A] [2:Pink] [3:Red] Granulation Quality: [1:None Present (0%)] [2:Medium (34-66%)] [3:None Present (0%)] Necrotic A mount: [1:Fascia: No] [2:Fat Layer (Subcutaneous Tissue): Yes Fat Layer (Subcutaneous  Tissue): Yes] Exposed Structures: [1:Fat Layer (Subcutaneous Tissue): No Tendon: No Muscle: No Joint: No Bone: No Large (67-100%)] [2:Fascia: No Tendon: No Muscle: No Joint: No Bone: No Small (1-33%)] [3:Fascia: No Tendon: No Muscle: No Joint: No Bone: No Medium (34-66%)] Epithelialization: [1:N/A] [2:Debridement - Excisional] [3:N/A] Debridement: Pre-procedure Verification/Time Out N/A [2:14:30] [3:N/A] Taken: [1:N/A] [2:Other] [3:N/A] Pain Control: [1:N/A] [2:Subcutaneous] [3:N/A] Tissue Debrided: [1:N/A] [2:Skin/Subcutaneous Tissue] [3:N/A] Level: [1:N/A] [2:4.5] [3:N/A] Debridement A (sq cm): [1:rea N/A] [2:Curette] [3:N/A] Instrument: [1:N/A] [2:Minimum] [3:N/A] Bleeding: [1:N/A] [2:Pressure] [3:N/A] Hemostasis A chieved: [1:N/A] [2:0] [3:N/A] Procedural Pain: [1:N/A] [2:0] [3:N/A] Post Procedural Pain: [1:N/A] [2:Procedure was tolerated well] [3:N/A] Debridement Treatment Response: [1:N/A] [2:1.5x3x0.5] [3:N/A] Post Debridement Measurements L x W x D (cm) [1:N/A] [2:1.767] [3:N/A] Post Debridement Volume: (cm) [1:N/A] [2:Debridement] [3:N/A] Treatment Notes Electronic Signature(s) Signed: 10/02/2020 5:35:06 PM By: Kela Millin Signed: 10/05/2020 5:03:49 PM By: Linton Ham MD Entered By: Linton Ham on 10/02/2020 15:00:29 -------------------------------------------------------------------------------- Multi-Disciplinary Care Plan Details Patient Name: Date of Service: [name], [name] 10/02/2020 1:00 PM Medical Record Number: [id number] Patient Account Number: [id number] Date of Birth/Sex: Treating RN: [birth year] (87 y.o. [name] Primary Care Myosha Cuadras: Dimas Chyle Other Clinician: Referring Hawken Bielby: Treating Sharlene Mccluskey/Extender: Donalee Citrin in Treatment: 3 Active Inactive Pain, Acute or Chronic Nursing Diagnoses: Pain, acute or chronic: actual or potential Goals: Patient/caregiver will verbalize adequate pain  control between visits Date Initiated: 09/11/2020 Target Resolution Date: 10/16/2020 Goal Status: Active Interventions: Provide education on pain management Notes: Venous Leg Ulcer Nursing Diagnoses: Actual venous Insuffiency (use after diagnosis is confirmed) Goals: Patient/caregiver will verbalize understanding of disease process and disease management Date Initiated: 09/11/2020 Target Resolution Date: 10/16/2020 Goal Status: Active Interventions: Compression as ordered Notes: Wound/Skin Impairment Nursing Diagnoses: Impaired tissue integrity Goals: Ulcer/skin breakdown will have a volume reduction of 50% by week 8 Date Initiated: 09/11/2020 Target Resolution Date: 10/16/2020 Goal Status: Active Interventions: Provide education on ulcer and skin care Notes: Electronic Signature(s) Signed: 10/02/2020 5:35:06 PM By: Kela Millin Entered By: Kela Millin on 10/02/2020 14:32:33 -------------------------------------------------------------------------------- Pain Assessment Details Patient Name: Date of Service: [name], [name] 10/02/2020 1:00 PM Medical Record Number: [id number] Patient Account Number: [id number] Date of Birth/Sex: Treating RN: May 21, 1928 (86 y.o. [name] Primary Care Elior Robinette: Dimas Chyle Other Clinician: Referring Alveta Quintela: Treating Brysen Shankman/Extender: Donalee Citrin in Treatment: 3 Active Problems Location of Pain Severity and Description of Pain Patient Has Paino Yes Site Locations Pain Location: Pain Location: Pain in Ulcers With Dressing Change: Yes Duration of the Pain. Constant / Intermittento Intermittent Rate the pain. Current Pain Level: 3 Worst Pain Level: 7 Character of Pain Describe the Pain: Aching Pain Management and Medication Current  Pain Management: Medication: Yes Is the Current Pain Management Adequate: Adequate Electronic Signature(s) Signed: 10/02/2020 5:35:06 PM By:  Kela Millin Signed: 10/02/2020 5:42:53 PM By: Baruch Gouty RN, BSN Entered By: Baruch Gouty on 10/02/2020 14:11:41 -------------------------------------------------------------------------------- Patient/Caregiver Education Details Patient Name: Date of Service: [name], [name] 10/8/2021andnbsp1:00 PM Medical Record Number: [id number] Patient Account Number: [id number] Date of Birth/Gender: Treating RN: 07-Jul-1928 (86 y.o. [name] Primary Care Physician: Dimas Chyle Other Clinician: Referring Physician: Treating Physician/Extender: Donalee Citrin in Treatment: 3 Education Assessment Education Provided To: Patient Education Topics Provided Pain: Handouts: A Guide to Pain Control Methods: Explain/Verbal Responses: State content correctly Wound/Skin Impairment: Handouts: Caring for Your Ulcer Methods: Explain/Verbal Responses: State content correctly Electronic Signature(s) Signed: 10/02/2020 5:35:06 PM By: Kela Millin Entered By: Kela Millin on 10/02/2020 14:32:50 -------------------------------------------------------------------------------- Wound Assessment Details Patient Name: Date of Service: [name], [name] 10/02/2020 1:00 PM Medical Record Number: [id number] Patient Account Number: [id number] Date of Birth/Sex: Treating RN: Dec 19, 1928 (86 y.o. [name] Primary Care Eddi Hymes: Dimas Chyle Other Clinician: Referring Malonie Tatum: Treating Regena Delucchi/Extender: Donalee Citrin in Treatment: 3 Wound Status Wound Number: 1 Primary Venous Leg Ulcer Etiology: Wound Location: Left, Anterior Lower Leg Wound Open Wounding Event: Gradually Appeared Status: Date Acquired: 06/25/2020 Comorbid Congestive Heart Failure, Coronary Artery Disease, Hypertension, Weeks Of Treatment: 3 History: Peripheral Arterial Disease, Peripheral Venous Disease, Type II Clustered Wound: No  Diabetes Photos Photo Uploaded By: Mikeal Hawthorne on 10/05/2020 13:25:44 Wound Measurements Length: (cm) Width: (cm) Depth: (cm) Area: (cm) Volume: (cm) 0 % Reduction in Area: 100% 0 % Reduction in Volume: 100% 0 Epithelialization: Large (67-100%) 0 Tunneling: No 0 Undermining: No Wound Description Classification: Full Thickness Without Exposed Support Structures Wound Margin: Flat and Intact Exudate Amount: None Present Foul Odor After Cleansing: No Slough/Fibrino No Wound Bed Granulation Amount: None Present (0%) Exposed Structure Necrotic Amount: None Present (0%) Fascia Exposed: No Fat Layer (Subcutaneous Tissue) Exposed: No Tendon Exposed: No Muscle Exposed: No Joint Exposed: No Bone Exposed: No Electronic Signature(s) Signed: 10/02/2020 5:35:06 PM By: Kela Millin Signed: 10/02/2020 5:42:53 PM By: Baruch Gouty RN, BSN Entered By: Baruch Gouty on 10/02/2020 14:09:24 -------------------------------------------------------------------------------- Wound Assessment Details Patient Name: Date of Service: [name], [name] 10/02/2020 1:00 PM Medical Record Number: [id number] Patient Account Number: [id number] Date of Birth/Sex: Treating RN: February 12, 1928 (86 y.o. [name] Primary Care Hashir Deleeuw: Dimas Chyle Other Clinician: Referring Eldine Rencher: Treating Paticia Moster/Extender: Donalee Citrin in Treatment: 3 Wound Status Wound Number: 2 Primary Diabetic Wound/Ulcer of the Lower Extremity Etiology: Wound Location: Left Calcaneus Wound Open Wounding Event: Gradually Appeared Status: Date Acquired: 05/26/2020 Comorbid Congestive Heart Failure, Coronary Artery Disease, Hypertension, Weeks Of Treatment: 3 History: Peripheral Arterial Disease, Peripheral Venous Disease, Type II Clustered Wound: No Diabetes Photos Photo Uploaded By: Mikeal Hawthorne on 10/05/2020 13:27:24 Wound Measurements Length: (cm) 1.5 % R Width: (cm) 3 %  R Depth: (cm) 0.5 Epi Area: (cm) 3.534 Tu Volume: (cm) 1.767 Un eduction in Area: 47.1% eduction in Volume: -164.5% thelialization: Small (1-33%) nneling: No dermining: No Wound Description Classification: Grade 2 Fo Wound Margin: Flat and Intact Sl Exudate Amount: Medium Exudate Type: Serosanguineous Exudate Color: red, brown ul Odor After Cleansing: No ough/Fibrino Yes Wound Bed Granulation Amount: Medium (34-66%) Exposed Structure Granulation Quality: Pink Fascia Exposed: No Necrotic Amount: Medium (34-66%) Fat Layer (Subcutaneous Tissue) Exposed: Yes Necrotic Quality: Adherent Slough Tendon Exposed: No Muscle Exposed: No Joint Exposed: No Bone  Exposed: No Treatment Notes Wound #2 (Left Calcaneus) 1. Cleanse With Wound Cleanser Soap and water 2. Periwound Care Moisturizing lotion TCA Cream 3. Primary Dressing Applied Iodoflex 4. Secondary Dressing Heel Cup 6. Support Layer Applied Kerlix/Coban Notes netting. Electronic Signature(s) Signed: 10/02/2020 5:35:06 PM By: Kela Millin Signed: 10/02/2020 5:42:53 PM By: Baruch Gouty RN, BSN Entered By: Baruch Gouty on 10/02/2020 14:10:04 -------------------------------------------------------------------------------- Wound Assessment Details Patient Name: Date of Service: [name], [name] 10/02/2020 1:00 PM Medical Record Number: [id number] Patient Account Number: [id number] Date of Birth/Sex: Treating RN: [birth year] (86 y.o. [name] Primary Care Kirston Luty: Dimas Chyle Other Clinician: Referring Caesar Mannella: Treating Madysun Thall/Extender: Donalee Citrin in Treatment: 3 Wound Status Wound Number: 3 Primary Venous Leg Ulcer Etiology: Wound Location: Right Lower Leg Wound Open Wounding Event: Gradually Appeared Status: Date Acquired: 06/25/2020 Comorbid Congestive Heart Failure, Coronary Artery Disease, Hypertension, Weeks Of Treatment: 3 History: Peripheral  Arterial Disease, Peripheral Venous Disease, Type II Clustered Wound: No Diabetes Photos Photo Uploaded By: Mikeal Hawthorne on 10/05/2020 13:25:45 Wound Measurements Length: (cm) 1.1 Width: (cm) 0.5 Depth: (cm) 0.1 Area: (cm) 0.432 Volume: (cm) 0.043 % Reduction in Area: 99.7% % Reduction in Volume: 99.9% Epithelialization: Medium (34-66%) Tunneling: No Undermining: No Wound Description Classification: Full Thickness Without Exposed Support Structures Wound Margin: Flat and Intact Exudate Amount: Small Exudate Type: Serosanguineous Exudate Color: red, brown Foul Odor After Cleansing: No Slough/Fibrino Yes Wound Bed Granulation Amount: Large (67-100%) Exposed Structure Granulation Quality: Red Fascia Exposed: No Necrotic Amount: None Present (0%) Fat Layer (Subcutaneous Tissue) Exposed: Yes Tendon Exposed: No Muscle Exposed: No Joint Exposed: No Bone Exposed: No Treatment Notes Wound #3 (Right Lower Leg) 1. Cleanse With Wound Cleanser Soap and water 2. Periwound Care Moisturizing lotion TCA Cream 3. Primary Dressing Applied Calcium Alginate Ag 4. Secondary Dressing Dry Gauze 6. 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 10/02/2020 1:00 PM Medical Record Number: [id number] Patient Account Number: [id number] Date of Birth/Sex: Treating RN: Feb 02, 1928 (86 y.o. [name] Primary Care Tyrae Alcoser: Dimas Chyle Other Clinician: Referring Graylin Sperling: Treating Shandon Matson/Extender: Donalee Citrin in Treatment: 3 Vital Signs Time Taken: 13:47 Temperature (F): 97.6 Height (in): 67 Pulse (bpm): 63 Weight  (lbs): 185 Respiratory Rate (breaths/min): 18 Body Mass Index (BMI): 29 Blood Pressure (mmHg): 112/61 Reference Range: 80 - 120 mg / dl Electronic Signature(s) Signed: 10/06/2020 9:19:13 AM By: Sandre Kitty Entered By: Sandre Kitty on 10/02/2020 13:47:51

## 2020-10-08 ENCOUNTER — Other Ambulatory Visit (HOSPITAL_COMMUNITY): Payer: Self-pay

## 2020-10-08 NOTE — Progress Notes (Signed)
Paramedicine Encounter    Patient ID: Andrew [name], male    DOB: October 10, 1928, 86 y.o.   MRN: [id number]   Patient Care Team: [name], MD as PCP - General (Family Medicine) Larey Dresser, MD as PCP - Advanced Heart Failure (Cardiology) Larey Dresser, MD as Consulting Physician (Cardiology) Jorge Ny, LCSW as Social Worker (Licensed Clinical Social Worker) Gardiner Barefoot, DPM as Consulting Physician (Podiatry) [name], MD (Family Medicine) Patient, No Pcp Per (General Practice)  Patient Active Problem List   Diagnosis Date Noted   Acute respiratory failure with hypercapnia ([place]) 38/25/0539   Acute metabolic encephalopathy 76/73/4193   CKD (chronic kidney disease), stage IV ([place]) 06/02/2020   PAF (paroxysmal atrial fibrillation) ([place]) 06/02/2020   Hypothyroidism 06/02/2020   Chronic anticoagulation 06/02/2020   Debility 06/01/2020   Lumbar spondylosis 10/04/2018   Chronic respiratory failure with hypoxia ([place]) 09/16/2018   Pain of right thumb 06/19/2018   Bilateral recurrent inguinal hernias 11/28/2017   Carotid artery stenosis 79/01/4096   Chronic systolic CHF (congestive heart failure) ([place]) 12/02/2015   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 ([place]) 10/16/2015   PVD (peripheral vascular disease) ([place])    Hereditary and idiopathic peripheral neuropathy 10/02/2015   Peripelvic (lymphatic) cyst    Pernicious anemia 07/15/2015   Bilateral renal cysts 07/12/2015   Lung nodule, 67mm RML CT 07/12/15 07/12/2015   Stage 3 chronic kidney disease ([place]) 06/14/2015   Paroxysmal atrial fibrillation ([place]) 05/28/2015   AAA (abdominal aortic aneurysm) without rupture ([place]) 09/30/2014   Cardiomyopathy, ischemic 05/23/2014   DM (diabetes mellitus), type 2 with renal complications ([place]) 35/32/9924   Depression, major, single episode, complete remission ([place]) 03/18/2010   Hypothyroidism 02/03/2009   Osteoarthritis 05/27/2008    Hyperlipidemia associated with type 2 diabetes mellitus ([place]) 06/14/2007   Hypertension associated with diabetes ([place]) 06/14/2007   GERD 06/14/2007    Current Outpatient Medications:    acetaminophen (TYLENOL) 500 MG tablet, Take 2 tablets (1,000 mg total) by mouth every 8 (eight) hours as needed., Disp: 30 tablet, Rfl: 0   amiodarone (PACERONE) 200 MG tablet, Take 100 mg by mouth daily., Disp: , Rfl:    apixaban (ELIQUIS) 2.5 MG TABS tablet, Take 2.5 mg by mouth 2 (two) times daily. , Disp: , Rfl:    atorvastatin (LIPITOR) 80 MG tablet, Take 80 mg by mouth daily., Disp: , Rfl:    carvedilol (COREG) 12.5 MG tablet, Take 12.5 mg by mouth 2 (two) times daily with a meal., Disp: , Rfl:    cilostazol (PLETAL) 100 MG tablet, Take 1 tablet (100 mg total) by mouth 2 (two) times daily., Disp: 180 tablet, Rfl: 3   ezetimibe (ZETIA) 10 MG tablet, TAKE (1) TABLET DAILY., Disp: 90 tablet, Rfl: 0   FLUoxetine (PROZAC) 20 MG capsule, TAKE (1) CAPSULE DAILY., Disp: 90 capsule, Rfl: 0   gabapentin (NEURONTIN) 100 MG capsule, Take 100 mg by mouth 3 (three) times daily., Disp: , Rfl:    glucose blood (ACCU-CHEK AVIVA PLUS) test strip, CHECK BLOOD SUGAR 4 TIMES A DAY., Disp: 100 each, Rfl: 11   hydrALAZINE (APRESOLINE) 50 MG tablet, Take 50 mg by mouth 3 (three) times daily., Disp: , Rfl:    HYDROcodone-acetaminophen (NORCO/VICODIN) 5-325 MG tablet, TAKE 1 TABLET EVERY 6 HOURS AS NEEDED FOR MODERATE PAIN. (Patient not taking: Reported on 10/01/2020), Disp: 12 tablet, Rfl: 0   isosorbide mononitrate (IMDUR) 30 MG 24 hr tablet, Take 30 mg by mouth daily., Disp: ,  Rfl:    levothyroxine (SYNTHROID) 100 MCG tablet, TAKE 1 TABLET BEFORE BREAKFAST., Disp: 90 tablet, Rfl: 0   metolazone (ZAROXOLYN) 2.5 MG tablet, Take 1 tablet (2.5 mg total) by mouth as directed. By [facility] (Patient not taking: Reported on 09/17/2020), Disp: 3 tablet, Rfl: 0   Multiple Vitamins-Minerals (MULTIVITAMIN ADULTS 50+) TABS,  Take 1 tablet by mouth daily., Disp: , Rfl:    nitroGLYCERIN (NITROSTAT) 0.4 MG SL tablet, DISSOLVE 1 TABLET UNDER TONGUE AS NEEDED FOR CHEST PAIN,MAY REPEAT IN5 MINUTES FOR 2 DOSES. (Patient not taking: Reported on 09/17/2020), Disp: 25 tablet, Rfl: 3   pantoprazole (PROTONIX) 40 MG tablet, Take 1 tablet (40 mg total) by mouth 2 (two) times daily., Disp: , Rfl:    potassium chloride SA (KLOR-CON) 20 MEQ tablet, Take 20 mEq by mouth daily., Disp: , Rfl:    tiotropium (SPIRIVA) 18 MCG inhalation capsule, Place 18 mcg into inhaler and inhale daily as needed (for wheezing)., Disp: , Rfl:    torsemide (DEMADEX) 20 MG tablet, Take 4 tablets (80 mg total) by mouth 2 (two) times daily., Disp: 240 tablet, Rfl: 3 Allergies  Allergen Reactions   Metoprolol Shortness Of Breath, Palpitations and Other (See Comments)    Heart starts racing. Shallow breathing; pt states he also get skin irritation on his legs   Metformin Hives and Swelling    On legs   Metformin And Related Nausea And Vomiting      Social History   Socioeconomic History   Marital status: Married    Spouse name: Not on file   Number of children: Not on file   Years of education: Not on file   Highest education level: Not on file  Occupational History   Occupation: Retired  Tobacco Use   Smoking status: Never Smoker   Smokeless tobacco: Never Used   Tobacco comment: quit atleast 25 yrs ago, per pt  Vaping Use   Vaping Use: Never used  Substance and Sexual Activity   Alcohol use: Never    Comment: socially   Drug use: Never   Sexual activity: Not Currently  Other Topics Concern   Not on file  Social History Narrative   ** Merged History Encounter **       Pt lives in [place] with spouse. Retired from [employer].  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   Social Determinants of Health   Financial Resource Strain:    Difficulty of Paying Living  Expenses: Not on file  Food Insecurity:    Worried About Charity fundraiser in the Last Year: Not on file   [employer] of Food in the Last Year: Not on file  Transportation Needs:    Lack of Transportation (Medical): Not on file   Lack of Transportation (Non-Medical): Not on file  Physical Activity:    Days of Exercise per Week: Not on file   Minutes of Exercise per Session: Not on file  Stress:    Feeling of Stress : Not on file  Social Connections:    Frequency of Communication with Friends and Family: Not on file   Frequency of Social Gatherings with Friends and Family: Not on file   Attends Religious Services: Not on file   Active Member of Clubs or Organizations: Not on file   Attends Archivist Meetings: Not on file   Marital Status: Not on file  Intimate Partner Violence:    Fear of Current or Ex-Partner:  Not on file   Emotionally Abused: Not on file   Physically Abused: Not on file   Sexually Abused: Not on file    Physical Exam      Future Appointments  Date Time Provider [place]  10/09/2020  2:15 PM Ricard Dillon, MD [facility]  10/15/2020  2:00 PM [facility]  10/16/2020  2:20 PM Larey Dresser, MD [facility] None  11/03/2020  2:20 PM Larey Dresser, MD [facility] None    There were no vitals taken for this visit.  Weight yesterday-? Last visit weight-175  Pt reports he is doing well, he denies sob, no dizziness, no recent falls. No c/p, no bleeding issues. He has leg wraps on but it doesn't appear there is any edema noted. He has not weighed himself in several days. Sounds like he weighs about once a week.  He states he does not feel any fluid on him. abd soft/tender.  meds verified and pill box refilled.  Ordered gabapentin for refill.    Marylouise Stacks, [facility] Paramedic  10/08/20

## 2020-10-09 ENCOUNTER — Other Ambulatory Visit: Payer: Self-pay

## 2020-10-09 ENCOUNTER — Encounter (HOSPITAL_BASED_OUTPATIENT_CLINIC_OR_DEPARTMENT_OTHER): Payer: Medicare PPO | Admitting: Internal Medicine

## 2020-10-09 DIAGNOSIS — L97821 Non-pressure chronic ulcer of other part of left lower leg limited to breakdown of skin: Secondary | ICD-10-CM | POA: Diagnosis not present

## 2020-10-09 DIAGNOSIS — L97811 Non-pressure chronic ulcer of other part of right lower leg limited to breakdown of skin: Secondary | ICD-10-CM | POA: Diagnosis not present

## 2020-10-09 DIAGNOSIS — E11621 Type 2 diabetes mellitus with foot ulcer: Secondary | ICD-10-CM | POA: Diagnosis not present

## 2020-10-09 DIAGNOSIS — I87333 Chronic venous hypertension (idiopathic) with ulcer and inflammation of bilateral lower extremity: Secondary | ICD-10-CM | POA: Diagnosis not present

## 2020-10-09 DIAGNOSIS — Z86718 Personal history of other venous thrombosis and embolism: Secondary | ICD-10-CM | POA: Diagnosis not present

## 2020-10-09 DIAGNOSIS — L97422 Non-pressure chronic ulcer of left heel and midfoot with fat layer exposed: Secondary | ICD-10-CM | POA: Diagnosis not present

## 2020-10-09 NOTE — Progress Notes (Signed)
Andrew [name] ([id number]) Visit Report for 10/09/2020 Debridement Details Patient Name: Date of Service: Andrew [name], Andrew RGE A. 10/09/2020 2:15 PM Medical Record Number: [id number] Patient Account Number: [id number] Date of Birth/Sex: Treating RN: 1928/05/05 (85 y.o. Andrew [name] Mention Primary Care Provider: Dimas Chyle Other Clinician: Referring Provider: Treating Provider/Extender: Donalee Citrin in Treatment: 4 Debridement Performed for Assessment: Wound #2 Left Calcaneus Performed By: Physician Ricard Dillon., MD Debridement Type: Debridement Severity of Tissue Pre Debridement: Fat layer exposed Level of Consciousness (Pre-procedure): Awake and Alert Pre-procedure Verification/Time Out Yes - 16:35 Taken: Start Time: 16:36 Pain Control: Other : benzocaine 20% spray T Area Debrided (L x W): otal 1.2 (cm) x 2.9 (cm) = 3.48 (cm) Tissue and other material debrided: Viable, Non-Viable, Callus, Slough, Subcutaneous, Skin: Epidermis, Slough Level: Skin/Subcutaneous Tissue Debridement Description: Excisional Instrument: Curette Bleeding: Minimum Hemostasis Achieved: Silver Nitrate Procedural Pain: 7 Post Procedural Pain: 4 Response to Treatment: Procedure was tolerated well Level of Consciousness (Post- Awake and Alert procedure): Post Debridement Measurements of Total Wound Length: (cm) 1.2 Width: (cm) 2.9 Depth: (cm) 0.2 Volume: (cm) 0.547 Character of Wound/Ulcer Post Debridement: Improved Severity of Tissue Post Debridement: Fat layer exposed Post Procedure Diagnosis Same as Pre-procedure Electronic Signature(s) Signed: 10/09/2020 6:16:54 PM By: Linton Ham MD Signed: 10/09/2020 6:20:53 PM By: Baruch Gouty RN, BSN Entered By: Linton Ham on 10/09/2020 18:14:57 -------------------------------------------------------------------------------- HPI Details Patient Name: Date of Service: Andrew [name], Andrew RGE A. 10/09/2020 2:15  PM Medical Record Number: [id number] Patient Account Number: [id number] Date of Birth/Sex: Treating RN: 1928/01/20 (86 y.o. Andrew [name] Mention Primary Care Provider: Dimas Chyle Other Clinician: Referring Provider: Treating Provider/Extender: Donalee Citrin in Treatment: 4 History of Present Illness HPI Description: 09/11/2020 ADMISSION This is a soon-to-be 86 year old man who was hospitalized in June and subsequently spent time at [place] home. At some point he developed a pressure ulcer on his left heel. On discharge he was seen by podiatry and referred to Dr. Donzetta Matters with regards to the possibility of PAD. The patient underwent angiography on 08/10/2020 this showed heavily calcified vessels. The common iliac artery on the left appeared to have calcific disease but there was no gradient. Left common external leg arteries were heavily tortuous left lower extremity limited angiography developed demonstrated a SFA to be helped heavily calcified throughout with multiple greater than 50% stenosis but never frankly occludes he was not felt to be a candidate for endovascular repair. I think they had a minimal look at the right side and it appeared that the SFA was occluded and reconstitutes above the knee. The overall feeling was that if he could not heal his left heel ulcer he will eventually best be served by a left above-knee amputation The last I can see is that he should have been using a Santyl wet-to-dry dressing to the heel. During his stay with podiatry was also noted that he had venous insufficiency and I think venous insufficiency wounds. His wife tells me that he has been recommended for compression stockings in the past but the patient refused, he has wounds on his bilateral anterior lower legs right greater than the left. Chronic venous inflammation and dry flaking skin. Past medical history; heart failure, right heel wound, paroxysmal atrial fibrillation on  Eliquis, abdominal aortic aneurysm with previous repair, COPD, DVT, melanoma on his back, diabetes with a recent hemoglobin A1c of 7.3 however he is not on any treatment [type II], PAD, coronary artery disease, hypertension The  patient's most recent arterial studies were on 05/28/2020. This showed an ABI on the right at 1.12 but monophasic waveforms and absent waveforms at the posterior tibial artery. On the left his ABI was 1.47 [noncompressible] he had triphasic waveforms at the ATA and monophasic at the PTA. His TBI on the right was 1.12 on the left and 0.97. It was felt his ABIs were falsely elevated 9/24. The patient followed up with Dr. Donzetta Matters and had noninvasive arterial studies. On the right his ABIs were noncompressible monophasic waveforms with a TBI of 1.45 and a right great toe pressure of 172. On the left he had monophasic waveforms with noncompressible ABIs TBI of 0.79 and a left great toe pressure of 94. He will be seen in follow-up by Dr. Donzetta Matters in a month's time. The patient also has very significant chronic venous insufficiency 10/8; patient with known PAD. He came in with bilateral lower extremity wounds likely secondary to venous stasis. Still has one on the upper right tibial area. The area on the left leg is healed however he still has an area on the left heel. This needed an extensive debridement today. His wife brings in notes apparently Dr. March Rummage ordered an MRI of the left heel. I was unaware of this. I was not aware that he did not been seen by Dr. March Rummage and I do not have any records of this. Apparently MRI called to set up the appointment. I will need to look through epic on this 10/15; this is a patient with known PAD who was revascularized. He also has venous stasis disease. The area that was on the right anterior tibia has actually close down although he has a still a small area on the upper anterior tibia. 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 We have been using Iodoflex on this area and he is required debridements. We tried to get her to agree to an MRI that have been ordered by Dr. March Rummage. However she did not get back to the MRI people or they have not called her back. We will reorder this. He is quite tender around this wound and I wonder about osteomyelitis. Electronic Signature(s) Signed: 10/09/2020 6:16:54 PM By: Linton Ham MD Entered By: Linton Ham on 10/09/2020 18:11:48 -------------------------------------------------------------------------------- Physical Exam Details Patient Name: Date of Service: Andrew [name], Andrew RGE A. 10/09/2020 2:15 PM Medical Record Number: [id number] Patient Account Number: [id number] Date of Birth/Sex: Treating RN: 1928/02/14 (86 y.o. Andrew [name] Mention Primary Care Provider: Dimas Chyle Other Clinician: Referring Provider: Treating Provider/Extender: Donalee Citrin in Treatment: 4 Constitutional Sitting or standing Blood Pressure is within target range for patient.. Pulse regular and within target range for patient.Marland Kitchen Respirations regular, non-labored and within target range.. Temperature is normal and within the target range for the patient.Marland Kitchen Appears in no distress. Notes Wound exam Tip of his left heel still requiring debridement with a #5 curette. I am able to get down to a reasonable surface hemostasis with silver nitrate and a pressure dressing The original threatened area on the right lower mid tibia is closed. The upper tibia wound just below the tibial tuberosity is still open. He also has what looks to be cancer possibly squamous cell in the mid tibia Electronic Signature(s) Signed: 10/09/2020 6:16:54 PM By: Linton Ham MD Entered  By: Linton Ham on 10/09/2020  18:12:45 -------------------------------------------------------------------------------- Physician Orders Details Patient Name: Date of Service: Andrew [name], Andrew RGE A. 10/09/2020 2:15 PM Medical Record Number: [id number] Patient Account Number: [id number] Date of Birth/Sex: Treating RN: 04-09-1928 (86 y.o. Andrew [name] Mention Primary Care Provider: Dimas Chyle Other Clinician: Referring Provider: Treating Provider/Extender: Donalee Citrin in Treatment: 4 Verbal / Phone Orders: No Diagnosis Coding ICD-10 Coding Code Description E11.621 Type 2 diabetes mellitus with foot ulcer L97.422 Non-pressure chronic ulcer of left heel and midfoot with fat layer exposed I87.333 Chronic venous hypertension (idiopathic) with ulcer and inflammation of bilateral lower extremity L97.811 Non-pressure chronic ulcer of other part of right lower leg limited to breakdown of skin Follow-up Appointments Return Appointment in 2 weeks. Dressing Change Frequency Other: - HH to change twice a week on weeks that patient is not coming to wound care clinic Skin Barriers/Peri-Wound Care Moisturizing lotion - mixed with TCA cream to legs TCA Cream or Ointment Wound Cleansing May shower with protection. - cast protector Primary Wound Dressing Wound #2 Left Calcaneus Iodoflex Wound #4 Right,Proximal,Anterior Lower Leg Calcium Alginate with Silver Secondary Dressing Wound #2 Left Calcaneus Dry Gauze ABD pad Heel Cup - on left heel Wound #4 Right,Proximal,Anterior Lower Leg Dry Gauze Edema Control Kerlix and Coban - Bilateral - wrap from base of toes to tibial tuberosity Avoid standing for long periods of time Elevate legs to the level of the heart or above for 30 minutes daily and/or when sitting, a frequency of: Exercise regularly Additional Orders / Instructions Other: - patient to schedule appointment with dermatology for possible abnormal lesion right anterior lower leg [place] skilled nursing for wound care. - [facility] MRI, lower extremity with, without contast left foot - left heel diabetic foot ulcer CPT - (ICD10 L97.422 - Non-pressure chronic ulcer of left heel and midfoot with fat layer exposed) Electronic Signature(s) Signed: 10/09/2020 6:16:54 PM By: Linton Ham MD Signed: 10/09/2020 6:20:53 PM By: Baruch Gouty RN, BSN Entered By: Baruch Gouty on 10/09/2020 16:47:47 Prescription 10/09/2020 -------------------------------------------------------------------------------- Elmore Guise MD Patient Name: Provider: 1928-08-09 [id number] Date of Birth: NPI#: Jerilynn Mages [id number] Sex: DEA #: [id number] Phone #: License #: [place] Patient Address: [address], [place] [address] [place], [place] [address] [phone] Allergies metoprolol; metformin Provider's Orders MRI, lower extremity with, without contast left foot - ICD10: L97.422 - left heel diabetic foot ulcer CPT Hand Signature: Date(s): Electronic Signature(s) Signed: 10/09/2020 6:16:54 PM By: Linton Ham MD Signed: 10/09/2020 6:20:53 PM By: Baruch Gouty RN, BSN Entered By: Baruch Gouty on 10/09/2020 16:47:47 -------------------------------------------------------------------------------- Problem List Details Patient Name: Date of Service: Andrew [name], Andrew RGE A. 10/09/2020 2:15 PM Medical Record Number: [id number] Patient Account Number: [id number] Date of Birth/Sex: Treating RN: November 14, 1928 (86 y.o. Andrew [name] Mention Primary Care Provider: Dimas Chyle Other Clinician: Referring Provider: Treating Provider/Extender: Donalee Citrin in Treatment: 4 Active Problems ICD-10 Encounter Code Description Active Date MDM Diagnosis E11.621 Type 2 diabetes mellitus with foot ulcer 09/11/2020 No Yes L97.422  Non-pressure chronic ulcer of left heel and midfoot with fat layer exposed 09/11/2020 No Yes I87.333 Chronic venous hypertension (idiopathic) with ulcer and inflammation of 09/11/2020 No Yes bilateral lower extremity L97.811 Non-pressure chronic ulcer of other part of right lower leg limited to breakdown 09/11/2020 No Yes of skin Inactive Problems ICD-10 Code Description Active Date Inactive Date L97.821 Non-pressure  chronic ulcer of other part of left lower leg limited to breakdown of skin 09/11/2020 09/11/2020 Resolved Problems Electronic Signature(s) Signed: 10/09/2020 6:16:54 PM By: Linton Ham MD Entered By: Linton Ham on 10/09/2020 18:08:29 -------------------------------------------------------------------------------- Progress Note Details Patient Name: Date of Service: Andrew [name], Andrew RGE A. 10/09/2020 2:15 PM Medical Record Number: [id number] Patient Account Number: [id number] Date of Birth/Sex: Treating RN: 1928-01-18 (86 y.o. Andrew [name] Mention Primary Care Provider: Dimas Chyle Other Clinician: Referring Provider: Treating Provider/Extender: Donalee Citrin in Treatment: 4 Subjective History of Present Illness (HPI) 09/11/2020 ADMISSION This is a soon-to-be 86 year old man who was hospitalized in June and subsequently spent time at [place] home. At some point he developed a pressure ulcer on his left heel. On discharge he was seen by podiatry and referred to Dr. Donzetta Matters with regards to the possibility of PAD. The patient underwent angiography on 08/10/2020 this showed heavily calcified vessels. The common iliac artery on the left appeared to have calcific disease but there was no gradient. Left common external leg arteries were heavily tortuous left lower extremity limited angiography developed demonstrated a SFA to be helped heavily calcified throughout with multiple greater than 50% stenosis but never frankly occludes he was not felt to be a  candidate for endovascular repair. I think they had a minimal look at the right side and it appeared that the SFA was occluded and reconstitutes above the knee. The overall feeling was that if he could not heal his left heel ulcer he will eventually best be served by a left above-knee amputation The last I can see is that he should have been using a Santyl wet-to-dry dressing to the heel. During his stay with podiatry was also noted that he had venous insufficiency and I think venous insufficiency wounds. His wife tells me that he has been recommended for compression stockings in the past but the patient refused, he has wounds on his bilateral anterior lower legs right greater than the left. Chronic venous inflammation and dry flaking skin. Past medical history; heart failure, right heel wound, paroxysmal atrial fibrillation on Eliquis, abdominal aortic aneurysm with previous repair, COPD, DVT, melanoma on his back, diabetes with a recent hemoglobin A1c of 7.3 however he is not on any treatment [type II], PAD, coronary artery disease, hypertension The patient's most recent arterial studies were on 05/28/2020. This showed an ABI on the right at 1.12 but monophasic waveforms and absent waveforms at the posterior tibial artery. On the left his ABI was 1.47 [noncompressible] he had triphasic waveforms at the ATA and monophasic at the PTA. His TBI on the right was 1.12 on the left and 0.97. It was felt his ABIs were falsely elevated 9/24. The patient followed up with Dr. Donzetta Matters and had noninvasive arterial studies. On the right his ABIs were noncompressible monophasic waveforms with a TBI of 1.45 and a right great toe pressure of 172. On the left he had monophasic waveforms with noncompressible ABIs TBI of 0.79 and a left great toe pressure of 94. He will be seen in follow-up by Dr. Donzetta Matters in a month's time. The patient also has very significant chronic venous insufficiency 10/8; patient with known PAD. He came  in with bilateral lower extremity wounds likely secondary to venous stasis. Still has one on the upper right tibial area. The area on the left leg is healed however he still has an area on the left heel. This needed an extensive debridement today. His wife brings in notes apparently  Dr. March Rummage ordered an MRI of the left heel. I was unaware of this. I was not aware that he did not been seen by Dr. March Rummage and I do not have any records of this. Apparently MRI called to set up the appointment. I will need to look through epic on this 10/15; this is a patient with known PAD who was revascularized. He also has venous stasis disease. The area that was on the right anterior tibia has actually close down although he has a still a small area on the upper anterior tibia. 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 We have been using Iodoflex on this area and he is required debridements. We tried to get her to agree to an MRI that have been ordered by Dr. March Rummage. However she did not get back to the MRI people or they have not called her back. We will reorder this. He is quite tender around this wound and I wonder about osteomyelitis. Objective Constitutional Sitting or standing Blood Pressure is within target range for patient.. Pulse regular and within target range for patient.Marland Kitchen Respirations regular, non-labored and within target range.. Temperature is normal and within the target range for the patient.Marland Kitchen Appears in no distress. Vitals Time Taken: 3:32 PM, Height: 67 in, Weight: 185 lbs, BMI: 29, Temperature: 97.7 F, Pulse: 67 bpm, Respiratory Rate: 18 breaths/min, Blood Pressure: 107/59 mmHg. General Notes: Wound exam ooTip of his left heel still requiring debridement with a #5 curette. I am able to get down to a reasonable surface  hemostasis with silver nitrate and a pressure dressing ooThe original threatened area on the right lower mid tibia is closed. The upper tibia wound just below the tibial tuberosity is still open. ooHe also has what looks to be cancer possibly squamous cell in the mid tibia Integumentary (Hair, Skin) Wound #2 status is Open. Original cause of wound was Gradually Appeared. The wound is located on the Left Calcaneus. The wound measures 1.2cm length x 2.9cm width x 0.2cm depth; 2.733cm^2 area and 0.547cm^3 volume. There is Fat Layer (Subcutaneous Tissue) exposed. There is no tunneling or undermining noted. There is a medium amount of serosanguineous drainage noted. The wound margin is flat and intact. There is medium (34-66%) pink granulation within the wound bed. There is a medium (34-66%) amount of necrotic tissue within the wound bed including Adherent Slough. Wound #3 status is Healed - Epithelialized. Original cause of wound was Gradually Appeared. The wound is located on the Right Lower Leg. The wound measures 0cm length x 0cm width x 0cm depth; 0cm^2 area and 0cm^3 volume. There is Fat Layer (Subcutaneous Tissue) exposed. There is a small amount of serosanguineous drainage noted. The wound margin is flat and intact. There is large (67-100%) red granulation within the wound bed. There is no necrotic tissue within the wound bed. Wound #4 status is Open. Original cause of wound was Gradually Appeared. The wound is located on the Right,Proximal,Anterior Lower Leg. The wound measures 0.4cm length x 0.3cm width x 0.1cm depth; 0.094cm^2 area and 0.009cm^3 volume. There is Fat Layer (Subcutaneous Tissue) exposed. There is no tunneling or undermining noted. There is a small amount of serosanguineous drainage noted. The wound margin is flat and intact. There is large (67-100%) red granulation within the wound bed. There is no necrotic  tissue within the wound bed. Assessment Active Problems ICD-10 Type  2 diabetes mellitus with foot ulcer Non-pressure chronic ulcer of left heel and midfoot with fat layer exposed Chronic venous hypertension (idiopathic) with ulcer and inflammation of bilateral lower extremity Non-pressure chronic ulcer of other part of right lower leg limited to breakdown of skin Procedures Wound #2 Pre-procedure diagnosis of Wound #2 is a Diabetic Wound/Ulcer of the Lower Extremity located on the Left Calcaneus .Severity of Tissue Pre Debridement is: Fat layer exposed. There was a Excisional Skin/Subcutaneous Tissue Debridement with a total area of 3.48 sq cm performed by Ricard Dillon., MD. With the following instrument(s): Curette to remove Viable and Non-Viable tissue/material. Material removed includes Callus, Subcutaneous Tissue, Slough, and Skin: Epidermis after achieving pain control using Other (benzocaine 20% spray). No specimens were taken. A time out was conducted at 16:35, prior to the start of the procedure. A Minimum amount of bleeding was controlled with Silver Nitrate. The procedure was tolerated well with a pain level of 7 throughout and a pain level of 4 following the procedure. Post Debridement Measurements: 1.2cm length x 2.9cm width x 0.2cm depth; 0.547cm^3 volume. Character of Wound/Ulcer Post Debridement is improved. Severity of Tissue Post Debridement is: Fat layer exposed. Post procedure Diagnosis Wound #2: Same as Pre-Procedure Plan Follow-up Appointments: Return Appointment in 2 weeks. Dressing Change Frequency: Other: - HH to change twice a week on weeks that patient is not coming to wound care clinic Skin Barriers/Peri-Wound Care: Moisturizing lotion - mixed with TCA cream to legs TCA Cream or Ointment Wound Cleansing: May shower with protection. - cast protector Primary Wound Dressing: Wound #2 Left Calcaneus: Iodoflex Wound #4 Right,Proximal,Anterior Lower Leg: Calcium Alginate with Silver Secondary Dressing: Wound #2 Left  Calcaneus: Dry Gauze ABD pad Heel Cup - on left heel Wound #4 Right,Proximal,Anterior Lower Leg: Dry Gauze Edema Control: Kerlix and Coban - Bilateral - wrap from base of toes to tibial tuberosity Avoid standing for long periods of time Elevate legs to the level of the heart or above for 30 minutes daily and/or when sitting, a frequency of: Exercise regularly Additional Orders / Instructions: Other: - patient to schedule appointment with dermatology for possible abnormal lesion right anterior lower leg Home Health: [place] skilled nursing for wound care. - [facility] ordered were: MRI, lower extremity with, without contast left foot - left heel diabetic foot ulcer CPT #1 on the left L calcaneus I am going to continue with Iodoflex. I do agree with the MRI there is a lot of tenderness around this area even well away from the wounded area. 2. On the upper tibia area on the right were going to get and use silver alginate. Put him in kerlix Coban bilaterally. 3. I think he has a squamous cell carcinoma on the right mid tibia. We will refer him to dermatology here. 4. He continues to require debridements I have not been able to get to a healthy surface. I am continuing with the Iodoflex towards that end. I cannot rule out further debridements based on the condition of the wound surface. This is simply not ready for epithelialization Electronic Signature(s) Signed: 10/09/2020 6:16:54 PM By: Linton Ham MD Entered By: Linton Ham on 10/09/2020 18:15:53 -------------------------------------------------------------------------------- SuperBill Details Patient Name: Date of Service: Andrew [name], Andrew RGE A. 10/09/2020 Medical Record Number: [id number] Patient Account Number: [id number] Date of Birth/Sex: Treating RN: 1928-06-09 (86 y.o. Andrew [name] Mention Primary Care Provider: Dimas Chyle Other Clinician: Referring Provider: Treating  Provider/Extender: Donalee Citrin in Treatment: 4 Diagnosis Coding ICD-10 Codes Code Description E11.621 Type 2 diabetes mellitus with foot ulcer L97.422 Non-pressure chronic ulcer of left heel and midfoot with fat layer exposed I87.333 Chronic venous hypertension (idiopathic) with ulcer and inflammation of bilateral lower extremity L97.811 Non-pressure chronic ulcer of other part of right lower leg limited to breakdown of skin Facility Procedures CPT4 Code: [id number] Description: 01484 - DEB SUBQ TISSUE 20 SQ CM/< ICD-10 Diagnosis Description L97.422 Non-pressure chronic ulcer of left heel and midfoot with fat layer exposed Modifier: Quantity: 1 Physician Procedures : CPT4 Code Description Modifier [id number] - WC PHYS SUBQ TISS 20 SQ CM ICD-10 Diagnosis Description L97.422 Non-pressure chronic ulcer of left heel and midfoot with fat layer exposed Quantity: 1 Electronic Signature(s) Signed: 10/09/2020 6:16:54 PM By: Linton Ham MD Entered By: Linton Ham on 10/09/2020 18:16:11

## 2020-10-12 ENCOUNTER — Other Ambulatory Visit: Payer: Self-pay | Admitting: Internal Medicine

## 2020-10-12 DIAGNOSIS — E1151 Type 2 diabetes mellitus with diabetic peripheral angiopathy without gangrene: Secondary | ICD-10-CM | POA: Diagnosis not present

## 2020-10-12 DIAGNOSIS — E1169 Type 2 diabetes mellitus with other specified complication: Secondary | ICD-10-CM | POA: Diagnosis not present

## 2020-10-12 DIAGNOSIS — I872 Venous insufficiency (chronic) (peripheral): Secondary | ICD-10-CM | POA: Diagnosis not present

## 2020-10-12 DIAGNOSIS — L89620 Pressure ulcer of left heel, unstageable: Secondary | ICD-10-CM | POA: Diagnosis not present

## 2020-10-12 DIAGNOSIS — N184 Chronic kidney disease, stage 4 (severe): Secondary | ICD-10-CM | POA: Diagnosis not present

## 2020-10-12 DIAGNOSIS — I5022 Chronic systolic (congestive) heart failure: Secondary | ICD-10-CM | POA: Diagnosis not present

## 2020-10-12 DIAGNOSIS — I130 Hypertensive heart and chronic kidney disease with heart failure and stage 1 through stage 4 chronic kidney disease, or unspecified chronic kidney disease: Secondary | ICD-10-CM | POA: Diagnosis not present

## 2020-10-12 DIAGNOSIS — E1122 Type 2 diabetes mellitus with diabetic chronic kidney disease: Secondary | ICD-10-CM | POA: Diagnosis not present

## 2020-10-12 DIAGNOSIS — L97422 Non-pressure chronic ulcer of left heel and midfoot with fat layer exposed: Secondary | ICD-10-CM

## 2020-10-12 DIAGNOSIS — D631 Anemia in chronic kidney disease: Secondary | ICD-10-CM | POA: Diagnosis not present

## 2020-10-12 NOTE — Progress Notes (Signed)
JAYMIEN, LANDIN ([id number]) Visit Report for 10/09/2020 Arrival Information Details Patient Name: Date of Service: Andrew [name], Andrew [name]. 10/09/2020 2:15 PM Medical Record Number: [id number] Patient Account Number: [id number] Date of Birth/Sex: Treating RN: 1928-05-12 (86 y.o. [name] Primary Care Tramain Gershman: Dimas Chyle Other Clinician: Referring Normon Pettijohn: Treating Febe Champa/Extender: Donalee Citrin in Treatment: 4 Visit Information History Since Last Visit Added or deleted any medications: No Patient Arrived: Wheel Chair Any new allergies or adverse reactions: No Arrival Time: 15:30 Had [name] fall or experienced change in No Accompanied By: wife activities of daily living that may affect Transfer Assistance: None risk of falls: Patient Identification Verified: Yes Signs or symptoms of abuse/neglect since last visito No Secondary Verification Process Completed: Yes Hospitalized since last visit: No Patient Requires Transmission-Based Precautions: No Implantable device outside of the clinic excluding No Patient Has Alerts: No cellular tissue based products placed in the center since last visit: Has Dressing in Place as Prescribed: Yes Pain Present Now: Yes Electronic Signature(s) Signed: 10/12/2020 9:39:47 AM By: Sandre Kitty Entered By: Sandre Kitty on 10/09/2020 15:30:59 -------------------------------------------------------------------------------- Encounter Discharge Information Details Patient Name: Date of Service: Andrew [name], Andrew [name]. 10/09/2020 2:15 PM Medical Record Number: [id number] Patient Account Number: [id number] Date of Birth/Sex: Treating RN: April 08, 1928 (86 y.o. Janyth Contes Primary Care Mirage Pfefferkorn: Dimas Chyle Other Clinician: Referring Morelia Cassells: Treating Keoni Havey/Extender: Donalee Citrin in Treatment: 4 Encounter Discharge Information Items Post Procedure Vitals Discharge Condition:  Stable Temperature (F): 97.7 Ambulatory Status: Wheelchair Pulse (bpm): 67 Discharge Destination: Home Respiratory Rate (breaths/min): 18 Transportation: Private Auto Blood Pressure (mmHg): 107/59 Accompanied By: wife Schedule Follow-up Appointment: Yes Clinical Summary of Care: Patient Declined Electronic Signature(s) Signed: 10/09/2020 5:57:53 PM By: Levan Hurst RN, BSN Entered By: Levan Hurst on 10/09/2020 17:44:29 -------------------------------------------------------------------------------- Lower Extremity Assessment Details Patient Name: Date of Service: Andrew [name], Andrew [name]. 10/09/2020 2:15 PM Medical Record Number: [id number] Patient Account Number: [id number] Date of Birth/Sex: Treating RN: 1928-08-06 (86 y.o. Oval Linsey Primary Care Scherry Laverne: Dimas Chyle Other Clinician: Referring Eddie Koc: Treating Sherrina Zaugg/Extender: Donalee Citrin in Treatment: 4 Edema Assessment Assessed: [Left: No] [Right: No] Edema: [Left: Yes] [Right: Yes] Calf Left: Right: Point of Measurement: 44 cm From Medial Instep 30.5 cm 30.5 cm Ankle Left: Right: Point of Measurement: 10 cm From Medial Instep 21.1 cm 20.7 cm Electronic Signature(s) Signed: 10/09/2020 6:06:03 PM By: Carlene Coria RN Entered By: Carlene Coria on 10/09/2020 15:50:13 -------------------------------------------------------------------------------- Multi Wound Chart Details Patient Name: Date of Service: Andrew [name], Andrew [name]. 10/09/2020 2:15 PM Medical Record Number: [id number] Patient Account Number: [id number] Date of Birth/Sex: Treating RN: July 06, 1928 (86 y.o. [name] Primary Care Olan Kurek: Dimas Chyle Other Clinician: Referring Chantavia Bazzle: Treating Shahmeer Bunn/Extender: Donalee Citrin in Treatment: 4 Vital Signs Height(in): 36 Pulse(bpm): 83 Weight(lbs): 185 Blood Pressure(mmHg): 107/59 Body Mass Index(BMI): 29 Temperature(F):  97.7 Respiratory Rate(breaths/min): 18 Photos: [2:No Photos Left Calcaneus] [3:No Photos Right Lower Leg] [4:No Photos Right, Proximal, Anterior Lower Leg] Wound Location: [2:Gradually Appeared] [3:Gradually Appeared] [4:Gradually Appeared] Wounding Event: [2:Diabetic Wound/Ulcer of the Lower] [3:Venous Leg Ulcer] [4:Venous Leg Ulcer] Primary Etiology: [2:Extremity Congestive Heart Failure, Coronary] [3:Congestive Heart Failure, Coronary] [4:Congestive Heart Failure, Coronary] Comorbid History: [2:Artery Disease, Hypertension, Peripheral Arterial Disease, Peripheral Venous Disease, Type II Diabetes 05/26/2020] [3:Artery Disease, Hypertension, Peripheral Arterial Disease, Peripheral Venous Disease, Type II Diabetes 06/25/2020]  [4:Artery Disease, Hypertension, Peripheral Arterial Disease, Peripheral Venous Disease, Type II Diabetes 10/09/2020] Date Acquired: [  2:4] [3:4] [4:0] Weeks of Treatment: [2:Open] [3:Healed - Epithelialized] [4:Open] Wound Status: [2:1.2x2.9x0.2] [3:0x0x0] [4:0.4x0.3x0.1] Measurements L x W x D (cm) [2:2.733] [3:0] [4:0.094] [name] (cm) : rea [2:0.547] [3:0] [4:0.009] Volume (cm) : [2:59.10%] [3:100.00%] [4:N/[name]] % Reduction in Area: [2:18.10%] [3:100.00%] [4:N/[name]] % Reduction in Volume: [2:Grade 2] [3:Full Thickness Without Exposed] [4:Full Thickness Without Exposed] Classification: [2:Medium] [3:Support Structures Small] [4:Support Structures Small] Exudate [name] mount: [2:Serosanguineous] [3:Serosanguineous] [4:Serosanguineous] Exudate Type: [2:red, brown] [3:red, brown] [4:red, brown] Exudate Color: [2:Flat and Intact] [3:Flat and Intact] [4:Flat and Intact] Wound Margin: [2:Medium (34-66%)] [3:Large (67-100%)] [4:Large (67-100%)] Granulation [name] mount: [2:Pink] [3:Red] [4:Red] Granulation Quality: [2:Medium (34-66%)] [3:None Present (0%)] [4:None Present (0%)] Necrotic [name] mount: [2:Fat Layer (Subcutaneous Tissue): Yes Fat Layer (Subcutaneous Tissue): Yes Fat Layer (Subcutaneous  Tissue): Yes] Exposed Structures: [2:Fascia: No Tendon: No Muscle: No Joint: No Bone: No Small (1-33%)] [3:Fascia: No Tendon: No Muscle: No Joint: No Bone: No Medium (34-66%)] [4:Fascia: No Tendon: No Muscle: No Joint: No Bone: No Small (1-33%)] Epithelialization: [2:Debridement - Selective/Open Wound N/[name]] [4:N/[name]] Debridement: Pre-procedure Verification/Time Out 16:35 [3:N/[name]] [4:N/[name]] Taken: [2:Other] [3:N/[name]] [4:N/[name]] Pain Control: [2:Callus, Slough] [3:N/[name]] [4:N/[name]] Tissue Debrided: [2:Skin/Epidermis] [3:N/[name]] [4:N/[name]] Level: [2:3.48] [3:N/[name]] [4:N/[name]] Debridement [name] (sq cm): [2:rea Curette] [3:N/[name]] [4:N/[name]] Instrument: [2:Minimum] [3:N/[name]] [4:N/[name]] Bleeding: [2:Silver Nitrate] [3:N/[name]] [4:N/[name]] Hemostasis [name] chieved: [2:7] [3:N/[name]] [4:N/[name]] Procedural Pain: [2:4] [3:N/[name]] [4:N/[name]] Post Procedural Pain: [2:Procedure was tolerated well] [3:N/[name]] [4:N/[name]] Debridement Treatment Response: [2:1.2x2.9x0.2] [3:N/[name]] [4:N/[name]] Post Debridement Measurements L x W x D (cm) [2:0.547] [3:N/[name]] [4:N/[name]] Post Debridement Volume: (cm) [2:Debridement] [3:N/[name]] [4:N/[name]] Treatment Notes Wound #2 (Left Calcaneus) 1. Cleanse With Soap and water 2. Periwound Care Moisturizing lotion TCA Cream 3. Primary Dressing Applied Iodoflex 4. Secondary Dressing Dry Gauze Heel Cup 6. Support Layer Applied Kerlix/Coban Notes netting. Wound #4 (Right, Proximal, Anterior Lower Leg) 1. Cleanse With Soap and water 2. Periwound Care Moisturizing lotion TCA Cream 3. Primary Dressing Applied Calcium Alginate Ag 4. Secondary Dressing ABD Pad Dry Gauze 6. 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 10/09/2020 2:15 PM Medical Record Number: [id number] Patient Account Number: [id number] Date of Birth/Sex: Treating RN: [birth year] (86 y.o. [name] Primary Care Raffi Milstein: Dimas Chyle Other Clinician: Referring Fatiha Guzy: Treating Laker Thompson/Extender: Donalee Citrin in Treatment: 4 Active Inactive Pain, Acute or Chronic Nursing Diagnoses: Pain, acute or chronic: actual or potential Goals: Patient/caregiver will verbalize adequate pain control between visits Date Initiated: 09/11/2020 Target Resolution Date: 10/16/2020 Goal Status: Active Interventions: Provide education on pain management Notes: Venous Leg Ulcer Nursing Diagnoses: Actual venous Insuffiency (use after diagnosis is confirmed) Goals: Patient/caregiver will verbalize understanding of disease process and disease management Date Initiated: 09/11/2020 Target Resolution Date: 10/16/2020 Goal Status: Active Interventions: Compression as ordered Notes: Wound/Skin Impairment Nursing Diagnoses: Impaired tissue integrity Goals: Ulcer/skin breakdown will have [name] volume reduction of 50% by week 8 Date Initiated: 09/11/2020 Target Resolution Date: 10/16/2020 Goal Status: Active Interventions: Provide education on ulcer and skin care Notes: Electronic Signature(s) Signed: 10/09/2020 6:20:53 PM By: Baruch Gouty RN, BSN Entered By: Baruch Gouty on 10/09/2020 16:27:49 -------------------------------------------------------------------------------- Pain Assessment Details Patient Name: Date of Service: Andrew [name], Andrew [name]. 10/09/2020 2:15 PM Medical Record Number: [id number] Patient Account  Number: [id number] Date of Birth/Sex: Treating RN: Feb 13, 1928 (86 y.o. [name] Primary Care Yilia Sacca:  Dimas Chyle Other Clinician: Referring Rayn Enderson: Treating Nahia Nissan/Extender: Donalee Citrin in Treatment: 4 Active Problems Location of Pain Severity and Description of Pain Patient Has Paino Yes Site Locations Rate the pain. Current Pain Level: 8 Pain Management and Medication Current Pain Management: Electronic Signature(s) Signed: 10/09/2020 6:20:53 PM By: Baruch Gouty RN, BSN Signed: 10/12/2020 9:39:47 AM By: Sandre Kitty Entered By: Sandre Kitty on 10/09/2020 15:31:48 -------------------------------------------------------------------------------- Patient/Caregiver Education Details Patient Name: Date of Service: Andrew [name], Andrew [name]. 10/15/2021andnbsp2:15 PM Medical Record Number: [id number] Patient Account Number: [id number] Date of Birth/Gender: Treating RN: [birth year] (86 y.o. [name] Primary Care Physician: Dimas Chyle Other Clinician: Referring Physician: Treating Physician/Extender: Donalee Citrin in Treatment: 4 Education Assessment Education Provided To: Patient Education Topics Provided Wound/Skin Impairment: Methods: Explain/Verbal Responses: Reinforcements needed, State content correctly Electronic Signature(s) Signed: 10/09/2020 6:20:53 PM By: Baruch Gouty RN, BSN Entered By: Baruch Gouty on 10/09/2020 78:67:67 -------------------------------------------------------------------------------- Wound Assessment Details Patient Name: Date of Service: Andrew [name], Andrew [name]. 10/09/2020 2:15 PM Medical Record Number: [id number] Patient Account Number: [id number] Date of Birth/Sex: Treating RN: 1928-08-09 (86 y.o. [name] Primary Care Clyde Zarrella: Dimas Chyle Other Clinician: Referring Lisl Slingerland: Treating Algis Lehenbauer/Extender: Donalee Citrin in Treatment: 4 Wound Status Wound Number: 2 Primary Diabetic Wound/Ulcer of the Lower Extremity Etiology: Wound  Location: Left Calcaneus Wound Open Wounding Event: Gradually Appeared Status: Date Acquired: 05/26/2020 Comorbid Congestive Heart Failure, Coronary Artery Disease, Hypertension, Weeks Of Treatment: 4 History: Peripheral Arterial Disease, Peripheral Venous Disease, Type II Clustered Wound: No Diabetes Wound Measurements Length: (cm) 1.2 Width: (cm) 2.9 Depth: (cm) 0.2 Area: (cm) 2.733 Volume: (cm) 0.547 % Reduction in Area: 59.1% % Reduction in Volume: 18.1% Epithelialization: Small (1-33%) Tunneling: No Undermining: No Wound Description Classification: Grade 2 Wound Margin: Flat and Intact Exudate Amount: Medium Exudate Type: Serosanguineous Exudate Color: red, brown Foul Odor After Cleansing: No Slough/Fibrino Yes Wound Bed Granulation Amount: Medium (34-66%) Exposed Structure Granulation Quality: Pink Fascia Exposed: No Necrotic Amount: Medium (34-66%) Fat Layer (Subcutaneous Tissue) Exposed: Yes Necrotic Quality: Adherent Slough Tendon Exposed: No Muscle Exposed: No Joint Exposed: No Bone Exposed: No Treatment Notes Wound #2 (Left Calcaneus) 1. Cleanse With Soap and water 2. Periwound Care Moisturizing lotion TCA Cream 3. Primary Dressing Applied Iodoflex 4. Secondary Dressing Dry Gauze Heel Cup 6. Support Layer Applied Kerlix/Coban Notes netting. Electronic Signature(s) Signed: 10/09/2020 6:06:03 PM By: Carlene Coria RN Signed: 10/09/2020 6:20:53 PM By: Baruch Gouty RN, BSN Entered By: Carlene Coria on 10/09/2020 15:50:25 -------------------------------------------------------------------------------- Wound Assessment Details Patient Name: Date of Service: Andrew [name], Andrew [name]. 10/09/2020 2:15 PM Medical Record Number: [id number] Patient Account Number: [id number] Date of Birth/Sex: Treating RN: Feb 25, 1928 (86 y.o. [name] Primary Care Cobe Viney: Dimas Chyle Other Clinician: Referring Eddye Broxterman: Treating Finnbar Cedillos/Extender: Donalee Citrin in Treatment: 4 Wound Status Wound Number: 3 Primary Venous Leg Ulcer Etiology: Wound Location: Right Lower Leg Wound Healed - Epithelialized Wounding Event: Gradually Appeared Status: Date Acquired: 06/25/2020 Comorbid Congestive Heart Failure, Coronary Artery Disease, Hypertension, Weeks Of Treatment: 4 History: Peripheral Arterial Disease, Peripheral Venous Disease, Type II Clustered Wound: No Diabetes Wound Measurements Length: (cm) Width: (cm) Depth: (cm) Area: (cm) Volume: (cm) 0 % Reduction in Area: 100% 0 % Reduction in Volume: 100% 0 Epithelialization: Medium (34-66%) 0 0 Wound Description Classification: Full Thickness Without Exposed Support Structures Wound  Margin: Flat and Intact Exudate Amount: Small Exudate Type: Serosanguineous Exudate Color: red, brown Foul Odor After Cleansing: No Slough/Fibrino Yes Wound Bed Granulation Amount: Large (67-100%) Exposed Structure Granulation Quality: Red Fascia Exposed: No Necrotic Amount: None Present (0%) Fat Layer (Subcutaneous Tissue) Exposed: Yes Tendon Exposed: No Muscle Exposed: No Joint Exposed: No Bone Exposed: No Electronic Signature(s) Signed: 10/09/2020 6:20:53 PM By: Baruch Gouty RN, BSN Entered By: Baruch Gouty on 10/09/2020 16:41:38 -------------------------------------------------------------------------------- Wound Assessment Details Patient Name: Date of Service: Andrew [name], Andrew [name]. 10/09/2020 2:15 PM Medical Record Number: [id number] Patient Account Number: [id number] Date of Birth/Sex: Treating RN: 08-15-1928 (86 y.o. [name] Primary Care Shaquisha Wynn: Dimas Chyle Other Clinician: Referring Yemariam Magar: Treating Macil Crady/Extender: Donalee Citrin in Treatment: 4 Wound Status Wound Number: 4 Primary Venous Leg Ulcer Etiology: Wound Location: Right, Proximal, Anterior Lower Leg Wound Open Wounding Event: Gradually  Appeared Status: Date Acquired: 10/09/2020 Comorbid Congestive Heart Failure, Coronary Artery Disease, Hypertension, Weeks Of Treatment: 0 History: Peripheral Arterial Disease, Peripheral Venous Disease, Type II Clustered Wound: No Diabetes Wound Measurements Length: (cm) 0.4 Width: (cm) 0.3 Depth: (cm) 0.1 Area: (cm) 0.094 Volume: (cm) 0.009 % Reduction in Area: % Reduction in Volume: Epithelialization: Small (1-33%) Tunneling: No Undermining: No Wound Description Classification: Full Thickness Without Exposed Support Structures Wound Margin: Flat and Intact Exudate Amount: Small Exudate Type: Serosanguineous Exudate Color: red, brown Foul Odor After Cleansing: No Slough/Fibrino No Wound Bed Granulation Amount: Large (67-100%) Exposed Structure Granulation Quality: Red Fascia Exposed: No Necrotic Amount: None Present (0%) Fat Layer (Subcutaneous Tissue) Exposed: Yes Tendon Exposed: No Muscle Exposed: No Joint Exposed: No Bone Exposed: No Treatment Notes Wound #4 (Right, Proximal, Anterior Lower Leg) 1. Cleanse With Soap and water 2. Periwound Care Moisturizing lotion TCA Cream 3. Primary Dressing Applied Calcium Alginate Ag 4. Secondary Dressing ABD Pad Dry Gauze 6. 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 10/09/2020 2:15 PM Medical Record Number: [id number] Patient Account Number: [id number] Date of Birth/Sex: Treating RN: 1928-06-17 (86 y.o. [name] Primary Care Stormi Vandevelde: Dimas Chyle Other Clinician: Referring Jackeline Gutknecht: Treating Donie Moulton/Extender: Donalee Citrin in Treatment: 4 Vital Signs Time Taken: 15:32 Temperature (F):  97.7 Height (in): 67 Pulse (bpm): 67 Weight (lbs): 185 Respiratory Rate (breaths/min): 18 Body Mass Index (BMI): 29 Blood Pressure (mmHg): 107/59 Reference Range: 80 - 120 mg / dl Electronic Signature(s) Signed: 10/12/2020 9:39:47 AM By: Sandre Kitty Entered By: Sandre Kitty on 10/09/2020 15:32:56

## 2020-10-14 DIAGNOSIS — E1169 Type 2 diabetes mellitus with other specified complication: Secondary | ICD-10-CM | POA: Diagnosis not present

## 2020-10-14 DIAGNOSIS — I130 Hypertensive heart and chronic kidney disease with heart failure and stage 1 through stage 4 chronic kidney disease, or unspecified chronic kidney disease: Secondary | ICD-10-CM | POA: Diagnosis not present

## 2020-10-14 DIAGNOSIS — I872 Venous insufficiency (chronic) (peripheral): Secondary | ICD-10-CM | POA: Diagnosis not present

## 2020-10-14 DIAGNOSIS — I5022 Chronic systolic (congestive) heart failure: Secondary | ICD-10-CM | POA: Diagnosis not present

## 2020-10-14 DIAGNOSIS — N184 Chronic kidney disease, stage 4 (severe): Secondary | ICD-10-CM | POA: Diagnosis not present

## 2020-10-14 DIAGNOSIS — L89620 Pressure ulcer of left heel, unstageable: Secondary | ICD-10-CM | POA: Diagnosis not present

## 2020-10-14 DIAGNOSIS — E1151 Type 2 diabetes mellitus with diabetic peripheral angiopathy without gangrene: Secondary | ICD-10-CM | POA: Diagnosis not present

## 2020-10-14 DIAGNOSIS — E1122 Type 2 diabetes mellitus with diabetic chronic kidney disease: Secondary | ICD-10-CM | POA: Diagnosis not present

## 2020-10-14 DIAGNOSIS — D631 Anemia in chronic kidney disease: Secondary | ICD-10-CM | POA: Diagnosis not present

## 2020-10-15 ENCOUNTER — Ambulatory Visit (INDEPENDENT_AMBULATORY_CARE_PROVIDER_SITE_OTHER): Payer: Medicare PPO | Admitting: Physician Assistant

## 2020-10-15 ENCOUNTER — Other Ambulatory Visit (HOSPITAL_COMMUNITY): Payer: Self-pay

## 2020-10-15 ENCOUNTER — Encounter: Payer: Self-pay | Admitting: Physician Assistant

## 2020-10-15 ENCOUNTER — Other Ambulatory Visit: Payer: Self-pay

## 2020-10-15 VITALS — BP 108/56 | HR 65 | Temp 98.3°F | Resp 20 | Ht 67.0 in | Wt 185.0 lb

## 2020-10-15 DIAGNOSIS — I739 Peripheral vascular disease, unspecified: Secondary | ICD-10-CM

## 2020-10-15 DIAGNOSIS — Z85828 Personal history of other malignant neoplasm of skin: Secondary | ICD-10-CM | POA: Diagnosis not present

## 2020-10-15 DIAGNOSIS — C44729 Squamous cell carcinoma of skin of left lower limb, including hip: Secondary | ICD-10-CM | POA: Diagnosis not present

## 2020-10-15 DIAGNOSIS — C44722 Squamous cell carcinoma of skin of right lower limb, including hip: Secondary | ICD-10-CM | POA: Diagnosis not present

## 2020-10-15 NOTE — Progress Notes (Signed)
Mr Granderson was seen at home today to fill his pillbox only. He has appointment with two specialists today and a follow up scheduled at the HF clinic tomorrow. Due to him needing to expedite our visit to get to his appointment, not vital signs or physical assessment was completed. I will follow up if necessary after his HF clinic appointment tomorrow.   [name], EMT 10/15/20

## 2020-10-15 NOTE — Progress Notes (Signed)
Office Note     CC:  follow up Requesting Provider:  [name], MD  HPI: Andrew [name] is a 86 y.o. ([birth year]) male who presents for wound follow up. He has had non healing left heel wound and right anterior shin wound. He underwent Angiogram by Dr. Donzetta Matters on 08/10/20 which showed heavily calcified aorta and iliac vessels as well as SFA. There was no occlusive disease but diffuse areas of >50% stenosis. Due to having a stable wound Dr. Donzetta Matters did not elect to intervene with such diffuse disease. Of note patient does have occluded right SFA with above knee popliteal reconstitution. At time of follow up in September his wounds were stable. Pain overall was well managed. He was advised to follow up in 4-6 weeks for wound recheck.  He is here today for his follow up visit. He has been getting wound care for his right leg and left heel. These wounds are essentially unchanged. He was referred to Dermatology with concerns of the right leg wound being cancerous lesion. He came from Dermatologist prior to today's visit and had some biopsies of the right shin wound and an additional posterior wound on left leg. Concerns for squamous cell carcinomas. Additionally he is scheduled to have left lower extremity MRI by wound care with concerns of osteo in the left heel. He says that the right leg burns and left heel continues to hurt but that they remain tolerable. He does have some dementia so wife explains that he complains about pain more than he reports. She does state that he has been tolerating walking and getting around much better than before. The swelling in his legs has also improved since last visit per wife.  The pt is on a statin for cholesterol management and Zetia  The pt not on a daily aspirin.   Other AC: Eliquis, Pletal The pt is on CCB, BB, Hydralazine and diuretic for hypertension.   The pt is diabetic.  Tobacco hx: never  Past Medical History:  Diagnosis Date  . AAA (abdominal aortic  aneurysm) ([place])   . Anemia   . Arthritis   . CAD (coronary artery disease)   . Cancer ([place])    Melanoma - Back  . Carotid artery stenosis   . CHF (congestive heart failure) ([place])   . COPD (chronic obstructive pulmonary disease) ([place])   . Cyst of kidney, acquired   . Depression   . Diabetes mellitus   . DVT (deep venous thrombosis) ([place])   . Dysrhythmia   . Fatty liver 2008  . GERD (gastroesophageal reflux disease)   . Hyperlipidemia   . Hypertension   . IBS (irritable bowel syndrome)   . LVF (left ventricular failure) ([place])   . Paroxysmal atrial fibrillation ([facility])   . Pneumonia Jan. 2014  . Shortness of breath dyspnea   . Thyroid disease   . Typical atrial flutter ([place])   . UTI (urinary tract infection) 05/2015   While in [place]   . Vitamin D deficiency     Past Surgical History:  Procedure Laterality Date  . ABDOMINAL AORTIC ANEURYSM REPAIR  2002  . ABDOMINAL AORTOGRAM W/LOWER EXTREMITY Left 08/10/2020   Procedure: ABDOMINAL AORTOGRAM W/LOWER EXTREMITY;  Surgeon: Waynetta Sandy, MD;  Location: [facility];  Service: Cardiovascular;  Laterality: Left;  . BIOPSY  06/10/2020   Procedure: BIOPSY;  Surgeon: Ronnette Juniper, MD;  Location: [facility];  Service: Gastroenterology;;  . CARDIAC CATHETERIZATION    . CORONARY ANGIOPLASTY    .  CORONARY ANGIOPLASTY WITH STENT PLACEMENT  2005  . CORONARY ARTERY BYPASS GRAFT  1992  . ESOPHAGEAL BRUSHING  06/10/2020   Procedure: ESOPHAGEAL BRUSHING;  Surgeon: Ronnette Juniper, MD;  Location: [facility];  Service: Gastroenterology;;  . ESOPHAGOGASTRODUODENOSCOPY N/A 06/10/2020   Procedure: ESOPHAGOGASTRODUODENOSCOPY (EGD);  Surgeon: Ronnette Juniper, MD;  Location: [facility];  Service: Gastroenterology;  Laterality: N/A;  . EYE SURGERY     [place]  . HERNIA REPAIR    . INGUINAL HERNIA REPAIR Bilateral    w/mesh  . INSERTION OF MESH N/A 11/28/2017   Procedure: INSERTION OF MESH;  Surgeon: Donnie Mesa, MD;  Location: [facility];   Service: General;  Laterality: N/A;  GENERAL AND TAP BLOCK  . KNEE SURGERY Bilateral   . LAPAROSCOPIC INGUINAL HERNIA WITH UMBILICAL HERNIA Bilateral 11/28/2017   Procedure: LAPAROSCOPIC BILATERAL INGUINAL HERNIA REPAIR WITH MESH, UMBILICAL HERNIA REPAIR WITH MESH;  Surgeon: Donnie Mesa, MD;  Location: [facility];  Service: General;  Laterality: Bilateral;  GENERAL AND TAP BLOCK  . LEFT HEART CATHETERIZATION WITH CORONARY ANGIOGRAM N/A 10/30/2014   Procedure: LEFT HEART CATHETERIZATION WITH CORONARY ANGIOGRAM;  Surgeon: Larey Dresser, MD;  [facility];  Service: Cardiovascular;  Laterality: N/A;  . QUADRICEPS TENDON REPAIR Bilateral 08/2003   Archie Endo 05/10/2011  . UMBILICAL HERNIA REPAIR  11/28/2017   w/mesh    Social History   Socioeconomic History  . Marital status: Married    Spouse name: Not on file  . Number of children: Not on file  . Years of education: Not on file  . Highest education level: Not on file  Occupational History  . Occupation: Retired  Tobacco Use  . Smoking status: Never Smoker  . Smokeless tobacco: Never Used  . Tobacco comment: quit atleast 25 yrs ago, per pt  Vaping Use  . Vaping Use: Never used  Substance and Sexual Activity  . Alcohol use: Never    Comment: socially  . Drug use: Never  . Sexual activity: Not Currently  Other Topics Concern  . Not on file  Social History Narrative   ** Merged History Encounter **       Pt lives in [place] with spouse. Retired from [employer].  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   Social Determinants of Health   Financial Resource Strain:   . Difficulty of Paying Living Expenses: Not on file  Food Insecurity:   . Worried About Charity fundraiser in the Last Year: Not on file  . Ran Out of Food in the Last Year: Not on file  Transportation Needs:   . Lack of Transportation (Medical): Not on file  . Lack of Transportation (Non-Medical): Not on file    Physical Activity:   . Days of Exercise per Week: Not on file  . Minutes of Exercise per Session: Not on file  Stress:   . Feeling of Stress : Not on file  Social Connections:   . Frequency of Communication with Friends and Family: Not on file  . Frequency of Social Gatherings with Friends and Family: Not on file  . Attends Religious Services: Not on file  . Active Member of Clubs or Organizations: Not on file  . Attends Archivist Meetings: Not on file  . Marital Status: Not on file  Intimate Partner Violence:   . Fear of Current or Ex-Partner: Not on file  . Emotionally Abused: Not on file  . Physically Abused: Not on file  .  Sexually Abused: Not on file    Family History  Problem Relation Age of Onset  . Colon cancer Father   . Coronary artery disease Brother   . Diabetes Brother   . Heart disease Brother   . Hyperlipidemia Brother   . Peripheral vascular disease Brother        Varicose Veins  . Throat cancer Brother        abdominal cancer?   . Diabetes Son   . Hyperlipidemia Son     Current Outpatient Medications  Medication Sig Dispense Refill  . acetaminophen (TYLENOL) 500 MG tablet Take 2 tablets (1,000 mg total) by mouth every 8 (eight) hours as needed. 30 tablet 0  . amiodarone (PACERONE) 200 MG tablet Take 100 mg by mouth daily.    Marland Kitchen apixaban (ELIQUIS) 2.5 MG TABS tablet Take 2.5 mg by mouth 2 (two) times daily.     Marland Kitchen atorvastatin (LIPITOR) 80 MG tablet Take 80 mg by mouth daily.    . carvedilol (COREG) 12.5 MG tablet Take 12.5 mg by mouth 2 (two) times daily with a meal.    . cilostazol (PLETAL) 100 MG tablet Take 1 tablet (100 mg total) by mouth 2 (two) times daily. 180 tablet 3  . ezetimibe (ZETIA) 10 MG tablet TAKE (1) TABLET DAILY. 90 tablet 0  . FLUoxetine (PROZAC) 20 MG capsule TAKE (1) CAPSULE DAILY. 90 capsule 0  . gabapentin (NEURONTIN) 100 MG capsule Take 100 mg by mouth 3 (three) times daily.    Marland Kitchen glucose blood (ACCU-CHEK AVIVA PLUS)  test strip CHECK BLOOD SUGAR 4 TIMES A DAY. 100 each 11  . hydrALAZINE (APRESOLINE) 50 MG tablet Take 50 mg by mouth 3 (three) times daily.    Marland Kitchen HYDROcodone-acetaminophen (NORCO/VICODIN) 5-325 MG tablet TAKE 1 TABLET EVERY 6 HOURS AS NEEDED FOR MODERATE PAIN. 12 tablet 0  . isosorbide mononitrate (IMDUR) 30 MG 24 hr tablet Take 30 mg by mouth daily.    Marland Kitchen levothyroxine (SYNTHROID) 100 MCG tablet TAKE 1 TABLET BEFORE BREAKFAST. 90 tablet 0  . metolazone (ZAROXOLYN) 2.5 MG tablet Take 1 tablet (2.5 mg total) by mouth as directed. By [facility] Clinic 3 tablet 0  . Multiple Vitamins-Minerals (MULTIVITAMIN ADULTS 50+) TABS Take 1 tablet by mouth daily.    . nitroGLYCERIN (NITROSTAT) 0.4 MG SL tablet DISSOLVE 1 TABLET UNDER TONGUE AS NEEDED FOR CHEST PAIN,MAY REPEAT IN5 MINUTES FOR 2 DOSES. 25 tablet 3  . pantoprazole (PROTONIX) 40 MG tablet Take 1 tablet (40 mg total) by mouth 2 (two) times daily.    . potassium chloride SA (KLOR-CON) 20 MEQ tablet Take 20 mEq by mouth daily.    Marland Kitchen tiotropium (SPIRIVA) 18 MCG inhalation capsule Place 18 mcg into inhaler and inhale daily as needed (for wheezing).    . torsemide (DEMADEX) 20 MG tablet Take 4 tablets (80 mg total) by mouth 2 (two) times daily. 240 tablet 3   No current facility-administered medications for this visit.    Allergies  Allergen Reactions  . Metoprolol Shortness Of Breath, Palpitations and Other (See Comments)    Heart starts racing. Shallow breathing; pt states he also get skin irritation on his legs  . Metformin Hives and Swelling    On legs  . Metformin And Related Nausea And Vomiting     REVIEW OF SYSTEMS:  [X]  denotes positive finding, [ ]  denotes negative finding Cardiac  Comments:  Chest pain or chest pressure:    Shortness of breath upon exertion:  Short of breath when lying flat:    Irregular heart rhythm:        Vascular    Pain in calf, thigh, or hip brought on by ambulation:    Pain in feet at night that wakes you up from  your sleep:     Blood clot in your veins:    Leg swelling:         Pulmonary    Oxygen at home:    Productive cough:     Wheezing:         Neurologic    Sudden weakness in arms or legs:     Sudden numbness in arms or legs:     Sudden onset of difficulty speaking or slurred speech:    Temporary loss of vision in one eye:     Problems with dizziness:         Gastrointestinal    Blood in stool:     Vomited blood:         Genitourinary    Burning when urinating:     Blood in urine:        Psychiatric    Major depression:         Hematologic    Bleeding problems:    Problems with blood clotting too easily:        Skin    Rashes or ulcers:        Constitutional    Fever or chills:      PHYSICAL EXAMINATION:  Vitals:   10/15/20 1339  BP: (!) 108/56  Pulse: 65  Resp: 20  Temp: 98.3 F (36.8 C)  TempSrc: Temporal  SpO2: 93%  Weight: 185 lb (83.9 kg)  Height: 5\' 7"  (1.702 m)    General:  WDWN in NAD; vital signs documented above Gait: Not observed, in wheel chair HENT: WNL, normocephalic Pulmonary: normal non-labored breathing , without wheezing Cardiac: regular HR, without  Murmurs without carotid bruit Abdomen: soft, NT, no masses Vascular Exam/Pulses:  Right Left  Radial 2+ (normal) 2+ (normal)  Femoral 2+ (normal) 2+ (normal)  Popliteal Not palpable 2+ (normal)  DP 2+ (normal) 1+ (normal)  PT Not palpable Not palpable   Extremities: with ischemic changes, without Gangrene , without cellulitis; with open wounds of the left heel, left posterior leg and right anterior leg       Musculoskeletal: no muscle wasting or atrophy  Neurologic: A&O X 3;  No focal weakness or paresthesias are detected Psychiatric:  The pt has Normal affect.   ASSESSMENT/PLAN:: 86 y.o. male here for follow up of peripheral vascular disease. He has had non healing left heel wound and right anterior shin wound. He underwent Angiogram by Dr. Donzetta Matters on 08/10/20, which showed  heavily calcified aorta and iliac vessels as well as SFA. There was no occlusive disease but diffuse areas of >50% stenosis. This was not amenable to intervention. His wounds are overall stable as well as pain. Patient and his wife know that he has limited options and next step would be a left above knee amputation if pain becomes intolerable or if he develops infection. They express that amputation is not something he would want and so aggressive wound care is the plan. He will keep follow up with Dermatology and Wound Care. - Advised them to follow up earlier if there is new or worsening symptoms - Continue Atorvastatin and Eliquis - Will have him follow up in another 4-6 weeks for wound check   Marshelle Bilger,  PA-C [facility] [phone]  Clinic MD:  Dr. Oneida Alar

## 2020-10-16 ENCOUNTER — Ambulatory Visit (HOSPITAL_COMMUNITY)
Admission: RE | Admit: 2020-10-16 | Discharge: 2020-10-16 | Disposition: A | Discharge status: Home / Self Care | Payer: Medicare PPO | Source: Ambulatory Visit | Attending: Cardiology | Admitting: Cardiology

## 2020-10-16 ENCOUNTER — Encounter (HOSPITAL_COMMUNITY): Payer: Self-pay | Admitting: Cardiology

## 2020-10-16 ENCOUNTER — Other Ambulatory Visit: Payer: Self-pay

## 2020-10-16 VITALS — BP 122/58 | HR 61 | Wt 176.4 lb

## 2020-10-16 DIAGNOSIS — Z951 Presence of aortocoronary bypass graft: Secondary | ICD-10-CM | POA: Diagnosis not present

## 2020-10-16 DIAGNOSIS — J9601 Acute respiratory failure with hypoxia: Secondary | ICD-10-CM | POA: Diagnosis not present

## 2020-10-16 DIAGNOSIS — E1122 Type 2 diabetes mellitus with diabetic chronic kidney disease: Secondary | ICD-10-CM | POA: Diagnosis not present

## 2020-10-16 DIAGNOSIS — Z79899 Other long term (current) drug therapy: Secondary | ICD-10-CM | POA: Insufficient documentation

## 2020-10-16 DIAGNOSIS — Z8249 Family history of ischemic heart disease and other diseases of the circulatory system: Secondary | ICD-10-CM | POA: Diagnosis not present

## 2020-10-16 DIAGNOSIS — E1151 Type 2 diabetes mellitus with diabetic peripheral angiopathy without gangrene: Secondary | ICD-10-CM | POA: Insufficient documentation

## 2020-10-16 DIAGNOSIS — I130 Hypertensive heart and chronic kidney disease with heart failure and stage 1 through stage 4 chronic kidney disease, or unspecified chronic kidney disease: Secondary | ICD-10-CM | POA: Diagnosis not present

## 2020-10-16 DIAGNOSIS — Z833 Family history of diabetes mellitus: Secondary | ICD-10-CM | POA: Insufficient documentation

## 2020-10-16 DIAGNOSIS — Z8673 Personal history of transient ischemic attack (TIA), and cerebral infarction without residual deficits: Secondary | ICD-10-CM | POA: Insufficient documentation

## 2020-10-16 DIAGNOSIS — Z8349 Family history of other endocrine, nutritional and metabolic diseases: Secondary | ICD-10-CM | POA: Diagnosis not present

## 2020-10-16 DIAGNOSIS — K219 Gastro-esophageal reflux disease without esophagitis: Secondary | ICD-10-CM | POA: Insufficient documentation

## 2020-10-16 DIAGNOSIS — E785 Hyperlipidemia, unspecified: Secondary | ICD-10-CM | POA: Diagnosis not present

## 2020-10-16 DIAGNOSIS — I255 Ischemic cardiomyopathy: Secondary | ICD-10-CM | POA: Insufficient documentation

## 2020-10-16 DIAGNOSIS — I5022 Chronic systolic (congestive) heart failure: Secondary | ICD-10-CM

## 2020-10-16 DIAGNOSIS — I2510 Atherosclerotic heart disease of native coronary artery without angina pectoris: Secondary | ICD-10-CM | POA: Diagnosis not present

## 2020-10-16 DIAGNOSIS — I4892 Unspecified atrial flutter: Secondary | ICD-10-CM | POA: Insufficient documentation

## 2020-10-16 DIAGNOSIS — Z7901 Long term (current) use of anticoagulants: Secondary | ICD-10-CM | POA: Diagnosis not present

## 2020-10-16 DIAGNOSIS — J9602 Acute respiratory failure with hypercapnia: Secondary | ICD-10-CM | POA: Diagnosis not present

## 2020-10-16 DIAGNOSIS — I739 Peripheral vascular disease, unspecified: Secondary | ICD-10-CM | POA: Diagnosis not present

## 2020-10-16 DIAGNOSIS — I480 Paroxysmal atrial fibrillation: Secondary | ICD-10-CM

## 2020-10-16 DIAGNOSIS — I252 Old myocardial infarction: Secondary | ICD-10-CM | POA: Insufficient documentation

## 2020-10-16 DIAGNOSIS — Z79890 Hormone replacement therapy (postmenopausal): Secondary | ICD-10-CM | POA: Insufficient documentation

## 2020-10-16 DIAGNOSIS — E039 Hypothyroidism, unspecified: Secondary | ICD-10-CM | POA: Insufficient documentation

## 2020-10-16 DIAGNOSIS — N183 Chronic kidney disease, stage 3 unspecified: Secondary | ICD-10-CM | POA: Insufficient documentation

## 2020-10-16 DIAGNOSIS — J449 Chronic obstructive pulmonary disease, unspecified: Secondary | ICD-10-CM | POA: Insufficient documentation

## 2020-10-16 LAB — COMPREHENSIVE METABOLIC PANEL
ALT: 27 U/L (ref 0–44)
AST: 30 U/L (ref 15–41)
Albumin: 3.2 g/dL — ABNORMAL LOW (ref 3.5–5.0)
Alkaline Phosphatase: 62 U/L (ref 38–126)
Anion gap: 11 (ref 5–15)
BUN: 49 mg/dL — ABNORMAL HIGH (ref 8–23)
CO2: 27 mmol/L (ref 22–32)
Calcium: 8.8 mg/dL — ABNORMAL LOW (ref 8.9–10.3)
Chloride: 99 mmol/L (ref 98–111)
Creatinine, Ser: 2.4 mg/dL — ABNORMAL HIGH (ref 0.61–1.24)
GFR, Estimated: 25 mL/min — ABNORMAL LOW (ref >60)
Glucose, Bld: 276 mg/dL — ABNORMAL HIGH (ref 70–99)
Potassium: 3.8 mmol/L (ref 3.5–5.1)
Sodium: 137 mmol/L (ref 135–145)
Total Bilirubin: 0.6 mg/dL (ref 0.3–1.2)
Total Protein: 6.2 g/dL — ABNORMAL LOW (ref 6.5–8.1)

## 2020-10-16 NOTE — Patient Instructions (Signed)
It was great to see you today! No medication changes are needed at this time.   Labs today We will only contact you if something comes back abnormal or we need to make some changes. Otherwise no news is good news!  Your physician recommends that you schedule a follow-up appointment in: 2 months with Dr [name]  If you have any questions or concerns before your next appointment please send [place] a message through [facility] or call our office at [phone].    TO LEAVE A MESSAGE FOR THE NURSE SELECT OPTION 2, PLEASE LEAVE A MESSAGE INCLUDING: . YOUR NAME . DATE OF BIRTH . CALL BACK NUMBER . REASON FOR CALL**this is important as we prioritize the call backs  YOU WILL RECEIVE A CALL BACK THE SAME DAY AS LONG AS YOU CALL BEFORE 4:00 PM

## 2020-10-18 NOTE — Progress Notes (Signed)
Date:  10/18/2020   ID:  [name], DOB [birth year], MRN [id number]  Provider location: [facility] Type of Visit: Established patient   PCP:  Andrew Barrack, MD  Cardiologist:  Dr. [name]   History of Present Illness: Andrew [name] is a 86 y.o. male who has a history of CKD, CAD s/p CABG, and ischemic cardiomyopathy with primarily diastolic CHF.  He has PAD followed at [facility].  I took him for [facility] in 11/15. 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  He had had occlusion of a relatively small LAD, there were right to left collaterals and no intervention was done (medical management).    He and his wife went on a cruise along the [employer] in 5/16.  He admits to considerable dietary indiscretion (high sodium diet). It appears that he developed a hypertensive crisis along with chest pain while on the ship. He went into atrial fibrillation and developed CHF.  He was taken to the hospital in [place], [place].  He was in the ICU for about a week on Bipap intermittently.  Troponin peaked at 0.09 during this admission.  He was diuresed and after about 2 wks left the hospital and was able to fly home.    Cardiolite (6/16) showed EF 42%, prior anterolateral MI, no ischemia. Echo in 10/16 with EF 40-45%.    He was admitted on 07/06/15 for RLQ pain and fever.  He was treated for UTI and had a kidney stone as well.  He developed SOB after IVF were given for AKI.  He was treated for acute COPD exacerbation as well as CHF decompensation.  He was diuresed with IV lasix.  His weight on admission was 179 and got as high as 188 after IVF. Discharge weight was 181.  He was admitted in 10/16 with a cerebellar CVA.  He has some resultant imbalance.   He was again admitted in 11/16 with acute on chronic systolic CHF.  This was a short admission that appeared to be precipitated by a sodium load from eating at a [employer] .   3/18 Echo showed stable EF 40-45%.  He  was also having chest pain so I did a Cardiolite in 3/18, no ischemia.   He was admitted in 11/18 for elective hernia repair. Post-op, he developed hypoxemia and had a rehab stay.  He is now off oxygen.   Creatinine increased to 2.97 in 1/19.  I cut back on losartan and Lasix.  He developed increased lower extremity edema and we increased Lasix back to 120 qam/80 qpm.  He had a mechanical fall in 7/20 (tripped). He hit his head but refused to go to the ER.  Later in 7/20, he was admitted with fever and started on antibiotics with concern for PNA, but CT chest did not show definite PNA.  ?Viral syndrome.  COVID-19 was negative.   Echo in 8/20 showed  EF 45-50% with anterolateral hypokinesis, normal RV.   He was admitted in 6/21 with aspiration PNA and COPD exacerbation, sent to [facility] for rehab afterwards.   Patient developed a left heel ulcer. In 8/21, he had peripheral angiogram by Dr. Donzetta Matters showing multiple left SFA stenoses > 50% but no occlusion. The right SFA was occluded with recanalization.  No interventional option.   Echo in 9/21 showed EF 40-45%, anterolateral hypokinesis, normal RV, mild mitral stenosis with mean gradient 7 mmHg, IVC dilated.   He returns for  followup of CHF and CAD. Weight is down 5 lbs.  Volume status is better-controlled on torsemide.  He has been going to wound clinic and legs wounds are healing.  No claudication.  He is falling less, now using walker.  No dyspnea walking around house or walking into office. No chest pain.  No orthopnea/PND.    Labs (12/14): K 4.8, creatinine 1.5 Labs (5/15): LDL 92 Labs (11/15): K 4.1, creatinine 1.5 Labs (12/15): K 4, creatinine 1.5 Labs (1/16): LFTs normal, LDL 51, HDL 47 Labs (5/16): TnI 0.09 Labs (6/16): K 4.8 => 4.4, creatinine 1.79 => 2.26, HCT 28.7 Labs (7/16) K 4.2, creatinine 1.98, LFTs normal, TSH normal Labs (8/16): HCT 27.1 Labs (9/16): hgb 8.1, K 4.3, creatinine 1.8, LFTs normal, TSH elevated Labs  (10/16): LDL 74, HDL 45, TSH 5.19 (increased), free T3 and free T4 normal.  Labs (11/16): K 3.5, creatinine 1.47, AST 84, ALT 89 Labs (12/16): K 4.3, creatinine 1.58, AST 43, ALT 42 Labs (3/17): K 4.2, creatinine 1.4, hgb 10.7, AST 36, ALT 45 Labs (6/17): K 4, creatinine 1.69, hgb 10.9, proBNP 389, TSH mildly elevated Labs (8/17): Free T4 and T3 normal, TSH mildly elevated, K 4.2, creatinine 1.7, LFTs normal, hgb 10.4 Labs (12/17): TSH elevated but free T3 and T4 normal, K 4.5, creatinine 1.9, LDL 45, HDL 39, LFTs normal, HCT 32.9 Labs (1/18): K 4.4, creatinine 1.93 Labs (3/18): K 4.3, creatinine 1.87, BNP 187, hgb 11.1, LDL 75, LFTs normal Labs (4/18): K 4.1, creatinine 1.7 Labs (9/18): K 4, creatinine 1.8, hgb 11.9 Labs (10/18): free T4 0.4, free T3 1.9, TSH 139  Labs (12/18): K 3.4, creatinine 1.76, hgb 12.2 Labs (1/19): K 4 => 4.9, creatinine 2.7 => 2.97, TSH 179, LFTs normal Labs (2/19): K 4.6, creatinine 2.19 Labs (4/19): LDL 61, HDL 36, K 3.9, creatinine 1.77, LFTs normal  Labs (6/19): hgb 11.7, K 3.8, creatinine 2.09, TSH 10 Labs (9/19): K 3.9, creatinine 1.69 Labs (12/19): K 3.5, creatinine 1.69, LDL 75, HDL 39, TSH normal Labs (7/20): K 3.8, creatinine 1.69 Labs (8/20): K 4, creatinine 1.63, hgb 12.1 Labs (12/20): K 3.9, creatinine 2.05, AST 44, ALT 58, hgb 12.9, TSH normal Labs (2/21): K 4, creatinine 1.99, hgb 12.5, plts 133, LDL 58, HDL 34, AST 72, ALT 87 Labs (8/21): K 3.9, creatinine 1.76 Labs (9/21): K 3.9, creatinine 2.08, LFTs normal, LDL 42  PMH: 1. CKD stage 3 2. HTN 3. Hyperlipidemia 4. AAA: s/p surgery with left renal artery bypass in 1997 - Abdominal US 10/18 with 3.2 AAA 5. GERD 6. IBS 7. Melanoma s/p resection 8. Diabetes: Diet-controlled 9. Carotid stenosis: Carotid dopplers (4/15) with 40-59% bilateral stenosis. Carotid dopplers (7/16) with < 40% BICA stenosis.  Carotid dopplers (10/16) with 40-59% BICA stenosis.  - Carotids (8/17) with minimal  disease.  - Carotids (10/18) with 1-39% BICA stenosis.  - Carotids (1/19): 1-39% BICA stenosis 10. PAD: 9/14 ABIs 0.91 right 1.09 left.  7/16 ABIs 0.86 right, 1.1 left; aortoiliac duplex with < 50% bilateral iliac stenosis.  - Aortoiliac duplex (8/17) with < 50% bilateral iliac stenosis.   - ABIs (10/17): left 0.91, right 0.98, left TBI 0.44 (abnormal).  - ABIs (7/18): right 0.97, right 0.75 - ABIs (1/19): right 1.04, left 1.03 - ABIs (7/19): right 1.13, noncompressible left ABI but TBI 0.93 - Peripheral angiogram (8/21): Multiple left SFA stenoses > 50% but no occlusion. The right SFA was occluded with recanalization.  No interventional option.  11. CAD: Has super-dominant  RCA. s/p CABG 1992.  He had Taxus DES to mid PDA in 5/02. LHC (1/05) with 90% ostial PDA (had DES to this vessel), totally occluded RIMA-PDA, totally occluded PLV, sequential SVG-PLV with 1 branch occluded.  LHC (11/15) with left main off the right cusp, 40-50% ostial left main, total occlusion of the proximal LAD (small vessel) with right to left collaterals, large super-dominant RCA with 50% ISR mid RCA, 50% ostial PDA, patent SVG-PLV, RIMA-PDA known atretic. Lexiscan Cardiolite (6/16) with EF 42%, prior anterolateral MI, no ischemia.  - Lexiscan Cardiolite (3/18): EF 51%, basal to mid anterolateral fixed defect with no ischemia.  12. Ischemic cardiomyopathy: Echo (12/09) with EF 45-50%, basal to mid inferolateral hypokinesis. Echo (6/15) with EF 45-50%, akinesis of the mid to apical inferolateral wall.  Echo (6/16) with EF 50-55%, mild LVH, inferior hypokinesis, mild MR.  Echo (10/16) with EF 40-45%, moderate LVH, mildly decreased RV systolic function.  - Echo (3/18) with EF 40-45%, mid anterior hypokinesis.  - Echo (2/19) with EF 60-65%, mild LVH, trade II diastolic dysfunction.  - Echo (8/20): EF 45-50%, anterolateral hypokinesis, normal RV size and systolic function.  - Echo (9/21): EF 40-45%, anterolateral hypokinesis,  normal RV, mild mitral stenosis with mean gradient 7 mmHg, IVC dilated.  13. OA 14. Atrial fibrillation/flutter: Paroxysmal.  Noted during 5/16 hospitalization in [place] and in 6/16.  He developed atrial flutter in 8/16, back in NSR by 9/16.  15. Emphysema: PFTs (8/16) with FVC 88%, FEV1 74%, ratio 81%, TLC 83%, DLCO 31%. - PFTs (12/18): FVC 64%, FEV1 69%, ratio 105%, TLC 82%, DLCO 33% => mild obstruction, severely decreased DLCO (similar to prior).   16. Renal cysts 17. Idiopathic peripheral neuropathy.  18. Anemia.  56. 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  20. Hernia repair (11/18) 21. Hypothyroidism 22. Bronchiectasis: CT chest 1/19 with bronchiectasis.  - Aspiration PNA and COPD exacerbation in 6/21.  23. Lung nodule: 1.3 cm RML nodule on 1/19 CT, was noted in 2014 => slow growing. Possibly malignant.  Decision made with pulmonary to watch and avoid biopsy.  - CT chest (7/20) with stable RML nodule, no evidence for amiodarone toxicity.    Current Outpatient Medications  Medication Sig Dispense Refill  . acetaminophen (TYLENOL) 500 MG tablet Take 2 tablets (1,000 mg total) by mouth every 8 (eight) hours as needed. 30 tablet 0  . amiodarone (PACERONE) 200 MG tablet Take 100 mg by mouth daily.    Marland Kitchen apixaban (ELIQUIS) 2.5 MG TABS tablet Take 2.5 mg by mouth 2 (two) times daily.     Marland Kitchen atorvastatin (LIPITOR) 80 MG tablet Take 80 mg by mouth daily.    . carvedilol (COREG) 12.5 MG tablet Take 12.5 mg by mouth 2 (two) times daily with a meal.    . cilostazol (PLETAL) 100 MG tablet Take 1 tablet (100 mg total) by mouth 2 (two) times daily. 180 tablet 3  . ezetimibe (ZETIA) 10 MG tablet TAKE (1) TABLET DAILY. 90 tablet 0  . FLUoxetine (PROZAC) 20 MG capsule TAKE (1) CAPSULE DAILY. 90 capsule 0  . gabapentin (NEURONTIN) 100 MG capsule Take 100 mg by mouth 3 (three) times daily.    Marland Kitchen glucose blood (ACCU-CHEK AVIVA PLUS) test strip CHECK BLOOD SUGAR 4 TIMES A DAY. 100 each 11  . hydrALAZINE (APRESOLINE)  50 MG tablet Take 50 mg by mouth 3 (three) times daily.    Marland Kitchen HYDROcodone-acetaminophen (NORCO/VICODIN) 5-325 MG tablet TAKE 1 TABLET EVERY 6 HOURS AS NEEDED FOR MODERATE PAIN. 12  tablet 0  . isosorbide mononitrate (IMDUR) 30 MG 24 hr tablet Take 30 mg by mouth daily.    Marland Kitchen levothyroxine (SYNTHROID) 100 MCG tablet TAKE 1 TABLET BEFORE BREAKFAST. 90 tablet 0  . metolazone (ZAROXOLYN) 2.5 MG tablet Take 1 tablet (2.5 mg total) by mouth as directed. By HF Clinic 3 tablet 0  . Multiple Vitamins-Minerals (MULTIVITAMIN ADULTS 50+) TABS Take 1 tablet by mouth daily.    . nitroGLYCERIN (NITROSTAT) 0.4 MG SL tablet DISSOLVE 1 TABLET UNDER TONGUE AS NEEDED FOR CHEST PAIN,MAY REPEAT IN5 MINUTES FOR 2 DOSES. 25 tablet 3  . pantoprazole (PROTONIX) 40 MG tablet Take 1 tablet (40 mg total) by mouth 2 (two) times daily.    . potassium chloride SA (KLOR-CON) 20 MEQ tablet Take 20 mEq by mouth daily.    Marland Kitchen tiotropium (SPIRIVA) 18 MCG inhalation capsule Place 18 mcg into inhaler and inhale daily as needed (for wheezing).    . torsemide (DEMADEX) 20 MG tablet Take 4 tablets (80 mg total) by mouth 2 (two) times daily. 240 tablet 3   No current facility-administered medications for this encounter.    Allergies:   Metoprolol, Metformin, and Metformin and related   Social History:  The patient  reports that he has never smoked. He has never used smokeless tobacco. He reports that he does not drink alcohol and does not use drugs.   Family History:  The patient's family history includes Colon cancer in his father; Coronary artery disease in his brother; Diabetes in his brother and son; Heart disease in his brother; Hyperlipidemia in his brother and son; Peripheral vascular disease in his brother; Throat cancer in his brother.   ROS:  Please see the history of present illness.   All other systems are personally reviewed and negative.   Exam:   BP (!) 122/58   Pulse 61   Wt 80 kg (176 lb 6.4 oz)   SpO2 93%   BMI 27.63  kg/m  General: NAD Neck: No JVD, no thyromegaly or thyroid nodule.  Lungs: Clear to auscultation bilaterally with normal respiratory effort. CV: Nondisplaced PMI.  Heart regular S1/S2, no S3/S4, no murmur.  Trace ankle edema.  No carotid bruit.  Unable to palpate pedal pulses.  Abdomen: Soft, nontender, no hepatosplenomegaly, no distention.  Skin: Intact without lesions or rashes.  Neurologic: Alert and oriented x 3.  Psych: Normal affect. Extremities: No clubbing or cyanosis.  HEENT: Normal.   Recent Labs: 06/03/2020: B Natriuretic Peptide 1,981.3 06/11/2020: Magnesium 2.0 09/08/2020: Hemoglobin 9.7; Platelets 175; TSH 1.456 10/16/2020: ALT 27; BUN 49; Creatinine, Ser 2.40; Potassium 3.8; Sodium 137  Personally reviewed   Wt Readings from Last 3 Encounters:  10/16/20 80 kg (176 lb 6.4 oz)  10/15/20 83.9 kg (185 lb)  10/01/20 79.6 kg (175 lb 6.4 oz)      ASSESSMENT AND PLAN:  1. CAD: s/p CABG.  Cath in 11/15 showed occluded LAD with left to right collaterals.  The LAD was a relatively small vessel (super-dominant right).  Managed medically. Lexiscan Cardiolite in 6/16 with infarction but no ischemia.  Cardiolite 3/18 with infarction, no ischemia. No chest pain.  - Continue atorvastatin, he is not on ASA given use of Eliquis.  2. Chronic heart failure with mid range EF/ischemic cardiomyopathy: EF 40-45% on 3/18 echo.  Echo 8/20 with EF 45-50%, normal RV. Echo in 9/21 with EF 40-45%, mild mitral stenosis.  NYHA II. Weight is down and volume better-controlled on torsemide.   - Continue torsemide  80 mg bid.  BMET today.     - Continue hydralazine 50 mg tid.    - Continue Imdur 30 mg daily.    - Continue Coreg 12.5 mg bid.  - Not on ACEi/ARB/spironolactone with elevated creatinine.    3. Carotid stenosis: Stable. Due for repeat carotid dopplers, needs to make followup at [facility].   4. PAD: Severe bilateral SFA disease on 8/21 peripheral angiogram, no revascularization options.  He has ulcers  on left heel and right lower leg => now healing with wound clinic followup.  Minimal claudication.  - Continue cilostazol (severe claudication if he stops it, though not ideal with CHF).  - Continue wound clinic followup.  5. Hyperlipidemia: Continue atorvastatin, good lipids in 9/21.  6. CKD III:  BMET today.  7. Atrial fibrillation/flutter:  Paroxysmal.  NSR today.  - Continue amiodarone 100 mg daily. Will need to get regular eye exams with amiodarone use.  CT chest in 7/20 did not appear to show evidence for amiodarone lung toxicity.  LFTs today.  Recent TSH normal.  He will need regular eye exam.     - Continue Eliquis 2.5 bid.  8. Suspected OSA:  Has declined sleep study.   9. COPD: He is now off oxygen. He has bronchiectasis thought to be due to chronic aspiration.  Hospitalization in 6/21 with PNA and COPD exacerbation.  - Follows with pulmonary.  10. CVA: Cerebellar, 10/16.  He has had resulting imbalance.  - He is no longer driving.  11. Hypertension: BP has been controlled when he takes his medications.  12. Hypothyroidism: Likely related to amiodarone. - Followed by PCP.  13. Pulmonary nodule: 1.3 cm RML nodule on 1/19 CT chest.  It was also seen in 2014 => slow growing.  Dr Lake Bells has discussed with wife and they have decided not to go forward with biopsy. They would not be interested in lung cancer treatment.  CT chest in 7/20 showed stable 1 cm RML nodule.   Followup in 2 months.   Signed, Loralie Champagne, MD  10/18/2020  [facility] [address] [facility] [place] [address] [phone] (office) [phone] (fax)

## 2020-10-19 DIAGNOSIS — N184 Chronic kidney disease, stage 4 (severe): Secondary | ICD-10-CM | POA: Diagnosis not present

## 2020-10-19 DIAGNOSIS — D631 Anemia in chronic kidney disease: Secondary | ICD-10-CM | POA: Diagnosis not present

## 2020-10-19 DIAGNOSIS — L89620 Pressure ulcer of left heel, unstageable: Secondary | ICD-10-CM | POA: Diagnosis not present

## 2020-10-19 DIAGNOSIS — I5022 Chronic systolic (congestive) heart failure: Secondary | ICD-10-CM | POA: Diagnosis not present

## 2020-10-19 DIAGNOSIS — I130 Hypertensive heart and chronic kidney disease with heart failure and stage 1 through stage 4 chronic kidney disease, or unspecified chronic kidney disease: Secondary | ICD-10-CM | POA: Diagnosis not present

## 2020-10-19 DIAGNOSIS — E1169 Type 2 diabetes mellitus with other specified complication: Secondary | ICD-10-CM | POA: Diagnosis not present

## 2020-10-19 DIAGNOSIS — E1151 Type 2 diabetes mellitus with diabetic peripheral angiopathy without gangrene: Secondary | ICD-10-CM | POA: Diagnosis not present

## 2020-10-19 DIAGNOSIS — E1122 Type 2 diabetes mellitus with diabetic chronic kidney disease: Secondary | ICD-10-CM | POA: Diagnosis not present

## 2020-10-19 DIAGNOSIS — I872 Venous insufficiency (chronic) (peripheral): Secondary | ICD-10-CM | POA: Diagnosis not present

## 2020-10-20 ENCOUNTER — Ambulatory Visit (HOSPITAL_COMMUNITY)
Admission: RE | Admit: 2020-10-20 | Discharge: 2020-10-20 | Disposition: A | Discharge status: Home / Self Care | Payer: Medicare PPO | Source: Ambulatory Visit | Attending: Internal Medicine | Admitting: Internal Medicine

## 2020-10-20 ENCOUNTER — Other Ambulatory Visit: Payer: Self-pay

## 2020-10-20 DIAGNOSIS — L97429 Non-pressure chronic ulcer of left heel and midfoot with unspecified severity: Secondary | ICD-10-CM | POA: Diagnosis not present

## 2020-10-20 DIAGNOSIS — L97422 Non-pressure chronic ulcer of left heel and midfoot with fat layer exposed: Secondary | ICD-10-CM | POA: Diagnosis not present

## 2020-10-20 MED ORDER — GADOBUTROL 1 MMOL/ML IV SOLN
8.0000 mL | Freq: Once | INTRAVENOUS | Status: AC | PRN
  Administered 2020-10-20: 8 mL via INTRAVENOUS

## 2020-10-22 ENCOUNTER — Other Ambulatory Visit (HOSPITAL_COMMUNITY): Payer: Self-pay

## 2020-10-22 NOTE — Progress Notes (Signed)
Paramedicine Encounter    Patient ID: KLINT LEZCANO, male    DOB: 23-Aug-1928, 86 y.o.   MRN: [id number]   Patient Care Team: [name], MD as PCP - General (Family Medicine) Larey Dresser, MD as PCP - Advanced Heart Failure (Cardiology) Larey Dresser, MD as Consulting Physician (Cardiology) Jorge Ny, LCSW as Social Worker (Licensed Clinical Social Worker) Gardiner Barefoot, DPM as Consulting Physician (Podiatry) [name], MD (Family Medicine) Patient, No Pcp Per (General Practice)  Patient Active Problem List   Diagnosis Date Noted   Acute respiratory failure with hypercapnia ([place]) 74/07/1447   Acute metabolic encephalopathy 18/56/3149   CKD (chronic kidney disease), stage IV ([place]) 06/02/2020   PAF (paroxysmal atrial fibrillation) ([place]) 06/02/2020   Hypothyroidism 06/02/2020   Chronic anticoagulation 06/02/2020   Debility 06/01/2020   Lumbar spondylosis 10/04/2018   Chronic respiratory failure with hypoxia ([place]) 09/16/2018   Pain of right thumb 06/19/2018   Bilateral recurrent inguinal hernias 11/28/2017   Carotid artery stenosis 70/26/3785   Chronic systolic CHF (congestive heart failure) ([place]) 12/02/2015   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 ([place]) 10/16/2015   PVD (peripheral vascular disease) ([place])    Hereditary and idiopathic peripheral neuropathy 10/02/2015   Peripelvic (lymphatic) cyst    Pernicious anemia 07/15/2015   Bilateral renal cysts 07/12/2015   Lung nodule, 33mm RML CT 07/12/15 07/12/2015   Stage 3 chronic kidney disease ([place]) 06/14/2015   Paroxysmal atrial fibrillation ([place]) 05/28/2015   AAA (abdominal aortic aneurysm) without rupture ([place]) 09/30/2014   Cardiomyopathy, ischemic 05/23/2014   DM (diabetes mellitus), type 2 with renal complications ([place]) 88/50/2774   Depression, major, single episode, complete remission ([place]) 03/18/2010   Hypothyroidism 02/03/2009   Osteoarthritis 05/27/2008    Hyperlipidemia associated with type 2 diabetes mellitus ([place]) 06/14/2007   Hypertension associated with diabetes ([place]) 06/14/2007   GERD 06/14/2007    Current Outpatient Medications:    acetaminophen (TYLENOL) 500 MG tablet, Take 2 tablets (1,000 mg total) by mouth every 8 (eight) hours as needed., Disp: 30 tablet, Rfl: 0   amiodarone (PACERONE) 200 MG tablet, Take 100 mg by mouth daily., Disp: , Rfl:    apixaban (ELIQUIS) 2.5 MG TABS tablet, Take 2.5 mg by mouth 2 (two) times daily. , Disp: , Rfl:    atorvastatin (LIPITOR) 80 MG tablet, Take 80 mg by mouth daily., Disp: , Rfl:    carvedilol (COREG) 12.5 MG tablet, Take 12.5 mg by mouth 2 (two) times daily with a meal., Disp: , Rfl:    cilostazol (PLETAL) 100 MG tablet, Take 1 tablet (100 mg total) by mouth 2 (two) times daily., Disp: 180 tablet, Rfl: 3   ezetimibe (ZETIA) 10 MG tablet, TAKE (1) TABLET DAILY., Disp: 90 tablet, Rfl: 0   FLUoxetine (PROZAC) 20 MG capsule, TAKE (1) CAPSULE DAILY., Disp: 90 capsule, Rfl: 0   gabapentin (NEURONTIN) 100 MG capsule, Take 100 mg by mouth 3 (three) times daily., Disp: , Rfl:    hydrALAZINE (APRESOLINE) 50 MG tablet, Take 50 mg by mouth 3 (three) times daily., Disp: , Rfl:    isosorbide mononitrate (IMDUR) 30 MG 24 hr tablet, Take 30 mg by mouth daily., Disp: , Rfl:    levothyroxine (SYNTHROID) 100 MCG tablet, TAKE 1 TABLET BEFORE BREAKFAST., Disp: 90 tablet, Rfl: 0   Multiple Vitamins-Minerals (MULTIVITAMIN ADULTS 50+) TABS, Take 1 tablet by mouth daily., Disp: , Rfl:    pantoprazole (PROTONIX) 40 MG tablet, Take 1 tablet (40 mg  total) by mouth 2 (two) times daily., Disp: , Rfl:    potassium chloride SA (KLOR-CON) 20 MEQ tablet, Take 20 mEq by mouth daily., Disp: , Rfl:    tiotropium (SPIRIVA) 18 MCG inhalation capsule, Place 18 mcg into inhaler and inhale daily as needed (for wheezing)., Disp: , Rfl:    torsemide (DEMADEX) 20 MG tablet, Take 4 tablets (80 mg total) by mouth 2 (two)  times daily., Disp: 240 tablet, Rfl: 3   glucose blood (ACCU-CHEK AVIVA PLUS) test strip, CHECK BLOOD SUGAR 4 TIMES A DAY., Disp: 100 each, Rfl: 11   HYDROcodone-acetaminophen (NORCO/VICODIN) 5-325 MG tablet, TAKE 1 TABLET EVERY 6 HOURS AS NEEDED FOR MODERATE PAIN. (Patient not taking: Reported on 10/22/2020), Disp: 12 tablet, Rfl: 0   metolazone (ZAROXOLYN) 2.5 MG tablet, Take 1 tablet (2.5 mg total) by mouth as directed. By HF Clinic (Patient not taking: Reported on 10/22/2020), Disp: 3 tablet, Rfl: 0   nitroGLYCERIN (NITROSTAT) 0.4 MG SL tablet, DISSOLVE 1 TABLET UNDER TONGUE AS NEEDED FOR CHEST PAIN,MAY REPEAT IN5 MINUTES FOR 2 DOSES. (Patient not taking: Reported on 10/22/2020), Disp: 25 tablet, Rfl: 3 Allergies  Allergen Reactions   Metoprolol Shortness Of Breath, Palpitations and Other (See Comments)    Heart starts racing. Shallow breathing; pt states he also get skin irritation on his legs   Metformin Hives and Swelling    On legs   Metformin And Related Nausea And Vomiting      Social History   Socioeconomic History   Marital status: Married    Spouse name: Not on file   Number of children: Not on file   Years of education: Not on file   Highest education level: Not on file  Occupational History   Occupation: Retired  Tobacco Use   Smoking status: Never Smoker   Smokeless tobacco: Never Used   Tobacco comment: quit atleast 25 yrs ago, per pt  Vaping Use   Vaping Use: Never used  Substance and Sexual Activity   Alcohol use: Never    Comment: socially   Drug use: Never   Sexual activity: Not Currently  Other Topics Concern   Not on file  Social History Narrative   ** Merged History Encounter **       Pt lives in [place] with spouse. Retired from [employer].  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   Social Determinants of Health   Financial Resource Strain:    Difficulty of Paying Living  Expenses: Not on file  Food Insecurity:    Worried About Charity fundraiser in the Last Year: Not on file   [employer] of Food in the Last Year: Not on file  Transportation Needs:    Lack of Transportation (Medical): Not on file   Lack of Transportation (Non-Medical): Not on file  Physical Activity:    Days of Exercise per Week: Not on file   Minutes of Exercise per Session: Not on file  Stress:    Feeling of Stress : Not on file  Social Connections:    Frequency of Communication with Friends and Family: Not on file   Frequency of Social Gatherings with Friends and Family: Not on file   Attends Religious Services: Not on file   Active Member of Clubs or Organizations: Not on file   Attends Archivist Meetings: Not on file   Marital Status: Not on file  Intimate Partner Violence:    Fear of Current or  Ex-Partner: Not on file   Emotionally Abused: Not on file   Physically Abused: Not on file   Sexually Abused: Not on file    Physical Exam Cardiovascular:     Rate and Rhythm: Normal rate and regular rhythm.     Pulses: Normal pulses.  Pulmonary:     Effort: Pulmonary effort is normal.     Breath sounds: Normal breath sounds.  Musculoskeletal:        General: Normal range of motion.     Right lower leg: Edema present.     Left lower leg: Edema present.  Skin:    General: Skin is warm and dry.     Capillary Refill: Capillary refill takes less than 2 seconds.  Neurological:     Mental Status: He is alert and oriented to person, place, and time.  Psychiatric:        Mood and Affect: Mood normal.         Future Appointments  Date Time Provider [place]  10/23/2020 12:30 PM Ricard Dillon, MD [facility]  11/03/2020  2:20 PM Larey Dresser, MD [facility] None  11/12/2020  1:00 PM [facility]  01/14/2021  1:40 PM Larey Dresser, MD [facility] None    BP 135/65 (BP Location: Right Arm, Patient Position: Sitting, Cuff  Size: Normal)    Pulse 62    Resp 16    SpO2 97%   Weight yesterday- did not weigh  Last visit weight- 176 lb  Mr Lizotte was seen at home today and reported feeling well. He denied chest pain, SOB, headache, dizziness, orthopnea, fever or cough over the past week. He stated he has been compliant with his medications and his weight has been stable though he did not weigh today because he was with a piano student while I was present and had not weighed prior to my arrival. His medications were verified and his pillbox was refilled. I will follow up next week.   [name], EMT 10/22/20  ACTION: Home visit completed Next visit planned for 1 week

## 2020-10-23 ENCOUNTER — Other Ambulatory Visit: Payer: Self-pay

## 2020-10-23 ENCOUNTER — Encounter (HOSPITAL_BASED_OUTPATIENT_CLINIC_OR_DEPARTMENT_OTHER): Payer: Medicare PPO | Admitting: Internal Medicine

## 2020-10-23 DIAGNOSIS — L97812 Non-pressure chronic ulcer of other part of right lower leg with fat layer exposed: Secondary | ICD-10-CM | POA: Diagnosis not present

## 2020-10-23 DIAGNOSIS — L97811 Non-pressure chronic ulcer of other part of right lower leg limited to breakdown of skin: Secondary | ICD-10-CM | POA: Diagnosis not present

## 2020-10-23 DIAGNOSIS — I872 Venous insufficiency (chronic) (peripheral): Secondary | ICD-10-CM | POA: Diagnosis not present

## 2020-10-23 DIAGNOSIS — L97422 Non-pressure chronic ulcer of left heel and midfoot with fat layer exposed: Secondary | ICD-10-CM | POA: Diagnosis not present

## 2020-10-23 DIAGNOSIS — L97821 Non-pressure chronic ulcer of other part of left lower leg limited to breakdown of skin: Secondary | ICD-10-CM | POA: Diagnosis not present

## 2020-10-23 DIAGNOSIS — I87333 Chronic venous hypertension (idiopathic) with ulcer and inflammation of bilateral lower extremity: Secondary | ICD-10-CM | POA: Diagnosis not present

## 2020-10-23 DIAGNOSIS — E11621 Type 2 diabetes mellitus with foot ulcer: Secondary | ICD-10-CM | POA: Diagnosis not present

## 2020-10-23 DIAGNOSIS — Z86718 Personal history of other venous thrombosis and embolism: Secondary | ICD-10-CM | POA: Diagnosis not present

## 2020-10-26 NOTE — Progress Notes (Signed)
SATISH, HAMMERS ([id number]) Visit Report for 10/23/2020 Arrival Information Details Patient Name: Date of Service: Andrew [name], Andrew [name] [name]. 10/23/2020 12:30 PM Medical Record Number: [id number] Patient Account Number: [id number] Date of Birth/Sex: Treating RN: 1928/04/09 (86 y.o. Andrew [name] Primary Care Torrion Witter: Dimas Chyle Other Clinician: Referring Glessie Eustice: Treating Donye Dauenhauer/Extender: Donalee Citrin in Treatment: 6 Visit Information History Since Last Visit Added or deleted any medications: No Patient Arrived: Wheel Chair Any new allergies or adverse reactions: No Arrival Time: 12:40 Had [name] fall or experienced change in No Accompanied By: wife activities of daily living that may affect Transfer Assistance: None risk of falls: Patient Identification Verified: Yes Signs or symptoms of abuse/neglect since last visito No Secondary Verification Process Completed: Yes Hospitalized since last visit: No Patient Requires Transmission-Based Precautions: No Implantable device outside of the clinic excluding No Patient Has Alerts: No cellular tissue based products placed in the center since last visit: Has Dressing in Place as Prescribed: Yes Has Compression in Place as Prescribed: Yes Pain Present Now: Yes Electronic Signature(s) Signed: 10/23/2020 4:26:07 PM By: Deon Pilling Entered By: Deon Pilling on 10/23/2020 12:57:25 -------------------------------------------------------------------------------- Encounter Discharge Information Details Patient Name: Date of Service: Andrew [name], Andrew [name]. 10/23/2020 12:30 PM Medical Record Number: [id number] Patient Account Number: [id number] Date of Birth/Sex: Treating RN: Apr 29, 1928 (86 y.o. Andrew [name] Primary Care Andrew [name]: Dimas Chyle Other Clinician: Referring [name]: Treating [name]/Extender: Donalee Citrin in Treatment: 6 Encounter Discharge Information Items Post  Procedure Vitals Discharge Condition: Stable Temperature (F): 97.3 Ambulatory Status: Wheelchair Pulse (bpm): 62 Discharge Destination: Home Respiratory Rate (breaths/min): 18 Transportation: Private Auto Blood Pressure (mmHg): 113/65 Accompanied By: spouse Schedule Follow-up Appointment: Yes Clinical Summary of Care: Electronic Signature(s) Signed: 10/23/2020 4:26:07 PM By: Deon Pilling Entered By: Deon Pilling on 10/23/2020 14:28:09 -------------------------------------------------------------------------------- Lower Extremity Assessment Details Patient Name: Date of Service: Andrew [name], Andrew [name]. 10/23/2020 12:30 PM Medical Record Number: [id number] Patient Account Number: [id number] Date of Birth/Sex: Treating RN: 05-Mar-1928 (86 y.o. Andrew [name] Primary Care Lawrnce Reyez: Dimas Chyle Other Clinician: Referring Hollace Michelli: Treating Marteze Vecchio/Extender: Donalee Citrin in Treatment: 6 Edema Assessment Assessed: Shirlyn Goltz: Yes] [Right: Yes] Edema: [Left: No] [Right: No] Calf Left: Right: Point of Measurement: 44 cm From Medial Instep 28 cm 31 cm Ankle Left: Right: Point of Measurement: 10 cm From Medial Instep 21 cm 23 cm Electronic Signature(s) Signed: 10/23/2020 4:26:07 PM By: Deon Pilling Entered By: Deon Pilling on 10/23/2020 12:58:14 -------------------------------------------------------------------------------- Multi Wound Chart Details Patient Name: Date of Service: Andrew [name], Andrew [name]. 10/23/2020 12:30 PM Medical Record Number: [id number] Patient Account Number: [id number] Date of Birth/Sex: Treating RN: [birth year] (86 y.o. [name] Primary Care Andrew [name]: Dimas Chyle Other Clinician: Referring Andrew [name]: Treating [name]/Extender: Donalee Citrin in Treatment: 6 Vital Signs Height(in): 36 Pulse(bpm): 55 Weight(lbs): 185 Blood Pressure(mmHg): 113/65 Body Mass Index(BMI): 29 Temperature(F):  97.3 Respiratory Rate(breaths/min): 18 Photos: [2:No Photos Left Calcaneus] [4:No Photos Right, Proximal, Anterior Lower Leg] [5:No Photos Left, Medial Lower Leg] Wound Location: [2:Gradually Appeared] [4:Gradually Appeared] [5:Gradually Appeared] Wounding Event: [2:Diabetic Wound/Ulcer of the Lower] [4:Venous Leg Ulcer] [5:Diabetic Wound/Ulcer of the Lower] Primary Etiology: [2:Extremity Congestive Heart Failure, Coronary] [4:Congestive Heart Failure, Coronary Congestive Heart Failure, Coronary] [5:Extremity] Comorbid History: [2:Artery Disease, Hypertension, Peripheral Arterial Disease, Peripheral Venous Disease, Type II Diabetes 05/26/2020] [4:Artery Disease, Hypertension, Peripheral Arterial Disease, Peripheral Peripheral Arterial Disease, Peripheral Venous  Disease, Type II Diabetes 10/09/2020] [5:Artery Disease, Hypertension, Venous Disease,  Type II Diabetes 10/23/2020] Date Acquired: [2:6] [4:2] [5:0] Weeks of Treatment: [2:Open] [4:Open] [5:Open] Wound Status: [2:1.3x2.6x0.1] [4:2.5x2.8x0.1] [5:0.6x0.8x0.1] Measurements L x W x D (cm) [2:2.655] [4:5.498] [5:0.377] [name] (cm) : rea [2:0.265] [4:0.55] [5:0.038] Volume (cm) : [2:60.20%] [4:-5748.90%] [5:N/[name]] % Reduction in Area: [2:60.30%] [4:-6011.10%] [5:N/[name]] % Reduction in Volume: [2:Grade 2] [4:Full Thickness Without Exposed] [5:Grade 2] Classification: [2:Medium] [4:Support Structures Medium] [5:Medium] Exudate Amount: [2:Serosanguineous] [4:Serosanguineous] [5:Serosanguineous] Exudate Type: [2:red, brown] [4:red, brown] [5:red, brown] Exudate Color: [2:Flat and Intact] [4:Flat and Intact] [5:Distinct, outline attached] Wound Margin: [2:Small (1-33%)] [4:Medium (34-66%)] [5:Large (67-100%)] Granulation Amount: [2:Pink] [4:Red] [5:Red] Granulation Quality: [2:Large (67-100%)] [4:Medium (34-66%)] [5:None Present (0%)] Necrotic Amount: [2:Fat Layer (Subcutaneous Tissue): Yes Fat Layer (Subcutaneous Tissue): Yes Fat Layer (Subcutaneous  Tissue): Yes] Exposed Structures: [2:Fascia: No Tendon: No Muscle: No Joint: No Bone: No Small (1-33%)] [4:Fascia: No Tendon: No Muscle: No Joint: No Bone: No None] [5:Fascia: No Tendon: No Muscle: No Joint: No Bone: No None] Epithelialization: [2:Debridement - Excisional] [4:Debridement - Excisional] [5:N/[name]] Debridement: Pre-procedure Verification/Time Out 13:10 [4:13:10] [5:N/[name]] Taken: [2:Other] [4:Other] [5:N/[name]] Pain Control: [2:Subcutaneous, Slough] [4:Subcutaneous, Slough] [5:N/[name]] Tissue Debrided: [2:Skin/Subcutaneous Tissue] [4:Skin/Subcutaneous Tissue] [5:N/[name]] Level: [2:3.38] [4:7] [5:N/[name]] Debridement [name] (sq cm): [2:rea Curette] [4:Curette] [5:N/[name]] Instrument: [2:Minimum] [4:Minimum] [5:N/[name]] Bleeding: [2:Pressure] [4:Pressure] [5:N/[name]] Hemostasis [name] chieved: [2:4] [4:4] [5:N/[name]] Procedural Pain: [2:2] [4:2] [5:N/[name]] Post Procedural Pain: [2:Procedure was tolerated well] [4:Procedure was tolerated well] [5:N/[name]] Debridement Treatment Response: [2:1.3x2.6x0.2] [4:2.5x2.8x0.1] [5:N/[name]] Post Debridement Measurements L x W x D (cm) [2:0.531] [4:0.55] [5:N/[name]] Post Debridement Volume: (cm) [2:Debridement] [4:Debridement] [5:N/[name]] Treatment Notes Electronic Signature(s) Signed: 10/23/2020 4:23:42 PM By: Baruch Gouty RN, BSN Signed: 10/26/2020 2:10:44 PM By: Linton Ham MD Entered By: Linton Ham on 10/23/2020 13:36:07 -------------------------------------------------------------------------------- Multi-Disciplinary Care Plan Details Patient Name: Date of Service: Andrew [name], Andrew [name]. 10/23/2020 12:30 PM Medical Record Number: [id number] Patient Account Number: [id number] Date of Birth/Sex: Treating RN: [birth year] (86 y.o. [name] Primary Care Hosam Mcfetridge: Dimas Chyle Other Clinician: Referring Tecumseh Yeagley: Treating Mariajose Mow/Extender: Donalee Citrin in Treatment: 6 Active Inactive Pain, Acute or Chronic Nursing Diagnoses: Pain, acute or chronic:  actual or potential Goals: Patient/caregiver will verbalize adequate pain control between visits Date Initiated: 09/11/2020 Target Resolution Date: 11/20/2020 Goal Status: Active Interventions: Provide education on pain management Notes: Wound/Skin Impairment Nursing Diagnoses: Impaired tissue integrity Goals: Patient/caregiver will verbalize understanding of skin care regimen Date Initiated: 10/23/2020 Target Resolution Date: 11/20/2020 Goal Status: Active Ulcer/skin breakdown will have [name] volume reduction of 50% by week 8 Date Initiated: 09/11/2020 Date Inactivated: 10/23/2020 Target Resolution Date: 10/16/2020 Goal Status: Unmet Unmet Reason: PAD Interventions: Provide education on ulcer and skin care Notes: Electronic Signature(s) Signed: 10/23/2020 4:23:42 PM By: Baruch Gouty RN, BSN Entered By: Baruch Gouty on 10/23/2020 12:59:16 -------------------------------------------------------------------------------- Pain Assessment Details Patient Name: Date of Service: Andrew [name], Andrew [name]. 10/23/2020 12:30 PM Medical Record Number: [id number] Patient Account Number: [id number] Date of Birth/Sex: Treating RN: [birth year] (86 y.o. Andrew [name] Primary Care Jamayah Myszka: Dimas Chyle Other Clinician: Referring Malay Fantroy: Treating Lashonne Shull/Extender: Donalee Citrin in Treatment: 6 Active Problems Location of Pain Severity and Description of Pain Patient Has Paino Yes Site Locations Pain Location: Pain in Ulcers Rate the pain. Current Pain Level: 9 Worst Pain Level: 10 Least Pain Level: 0 Tolerable Pain Level: 8 Pain Management and Medication Current Pain Management: Medication: No Cold Application: No Rest: No Massage: No Activity: No T.E.N.S.: No Heat Application: No Leg drop or elevation: No Is  the Current Pain Management Adequate: Adequate How does your wound impact your activities of daily livingo Sleep: No Bathing: No Appetite:  No Relationship With Others: No Bladder Continence: No Emotions: No Bowel Continence: No Work: No Toileting: No Drive: No Dressing: No Hobbies: No Electronic Signature(s) Signed: 10/23/2020 4:26:07 PM By: Deon Pilling Entered By: Deon Pilling on 10/23/2020 12:57:54 -------------------------------------------------------------------------------- Patient/Caregiver Education Details Patient Name: Date of Service: Andrew [name], Andrew [name]. 10/29/2021andnbsp12:30 PM Medical Record Number: [id number] Patient Account Number: [id number] Date of Birth/Gender: Treating RN: 02/02/1928 (86 y.o. [name] Primary Care Physician: Dimas Chyle Other Clinician: Referring Physician: Treating Physician/Extender: Donalee Citrin in Treatment: 6 Education Assessment Education Provided To: Patient Education Topics Provided Pain: Methods: Explain/Verbal Responses: Reinforcements needed, State content correctly Tissue Oxygenation: Methods: Explain/Verbal Responses: Reinforcements needed, State content correctly Wound/Skin Impairment: Methods: Explain/Verbal Responses: Reinforcements needed, State content correctly Electronic Signature(s) Signed: 10/23/2020 4:23:42 PM By: Baruch Gouty RN, BSN Entered By: Baruch Gouty on 10/23/2020 12:59:54 -------------------------------------------------------------------------------- Wound Assessment Details Patient Name: Date of Service: Andrew [name], Andrew [name]. 10/23/2020 12:30 PM Medical Record Number: [id number] Patient Account Number: [id number] Date of Birth/Sex: Treating RN: [birth year] (86 y.o. Andrew [name] Primary Care Kaysea Raya: Dimas Chyle Other Clinician: Referring Jovane Foutz: Treating Tyja Gortney/Extender: Donalee Citrin in Treatment: 6 Wound Status Wound Number: 2 Primary Diabetic Wound/Ulcer of the Lower Extremity Etiology: Wound Location: Left Calcaneus Wound Open Wounding Event:  Gradually Appeared Status: Date Acquired: 05/26/2020 Comorbid Congestive Heart Failure, Coronary Artery Disease, Hypertension, Weeks Of Treatment: 6 History: Peripheral Arterial Disease, Peripheral Venous Disease, Type II Clustered Wound: No Diabetes Wound Measurements Length: (cm) 1.3 Width: (cm) 2.6 Depth: (cm) 0.1 Area: (cm) 2.655 Volume: (cm) 0.265 % Reduction in Area: 60.2% % Reduction in Volume: 60.3% Epithelialization: Small (1-33%) Tunneling: No Undermining: No Wound Description Classification: Grade 2 Wound Margin: Flat and Intact Exudate Amount: Medium Exudate Type: Serosanguineous Exudate Color: red, brown Foul Odor After Cleansing: No Slough/Fibrino Yes Wound Bed Granulation Amount: Small (1-33%) Exposed Structure Granulation Quality: Pink Fascia Exposed: No Necrotic Amount: Large (67-100%) Fat Layer (Subcutaneous Tissue) Exposed: Yes Necrotic Quality: Adherent Slough Tendon Exposed: No Muscle Exposed: No Joint Exposed: No Bone Exposed: No Treatment Notes Wound #2 (Left Calcaneus) 1. Cleanse With Wound Cleanser Soap and water 2. Periwound Care Moisturizing lotion 3. Primary Dressing Applied Iodoflex 4. Secondary Dressing Dry Gauze Heel Cup 6. Support Layer Holiday representative) Signed: 10/23/2020 4:26:07 PM By: Deon Pilling Entered By: Deon Pilling on 10/23/2020 12:58:46 -------------------------------------------------------------------------------- Wound Assessment Details Patient Name: Date of Service: Andrew [name], Andrew [name]. 10/23/2020 12:30 PM Medical Record Number: [id number] Patient Account Number: [id number] Date of Birth/Sex: Treating RN: 1928/09/18 (86 y.o. Andrew [name] Primary Care Anwyn Kriegel: Dimas Chyle Other Clinician: Referring Ahsan Esterline: Treating Majestic Molony/Extender: Donalee Citrin in Treatment: 6 Wound Status Wound Number: 4 Primary Venous Leg Ulcer Etiology: Wound Location:  Right, Proximal, Anterior Lower Leg Wound Open Wounding Event: Gradually Appeared Status: Date Acquired: 10/09/2020 Comorbid Congestive Heart Failure, Coronary Artery Disease, Hypertension, Weeks Of Treatment: 2 History: Peripheral Arterial Disease, Peripheral Venous Disease, Type II Clustered Wound: No Diabetes Wound Measurements Length: (cm) 2.5 Width: (cm) 2.8 Depth: (cm) 0.1 Area: (cm) 5.498 Volume: (cm) 0.55 Wound Description Classification: Full Thickness Without Exposed Support Struct Wound Margin: Flat and Intact Exudate Amount: Medium Exudate Type: Serosanguineous Exudate Color: red, brown Foul Odor After Cleansing: Slough/Fibrino % Reduction in Area: -5748.9% % Reduction in Volume: -6011.1% Epithelialization: None Tunneling: No  Undermining: No ures No Yes Wound Bed Granulation Amount: Medium (34-66%) Exposed Structure Granulation Quality: Red Fascia Exposed: No Necrotic Amount: Medium (34-66%) Fat Layer (Subcutaneous Tissue) Exposed: Yes Tendon Exposed: No Muscle Exposed: No Joint Exposed: No Bone Exposed: No Treatment Notes Wound #4 (Right, Proximal, Anterior Lower Leg) 1. Cleanse With Wound Cleanser Soap and water 2. Periwound Care Moisturizing lotion 3. Primary Dressing Applied Iodoflex 4. Secondary Dressing Dry Gauze 6. Support Layer Holiday representative) Signed: 10/23/2020 4:26:07 PM By: Deon Pilling Entered By: Deon Pilling on 10/23/2020 12:59:28 -------------------------------------------------------------------------------- Wound Assessment Details Patient Name: Date of Service: Andrew [name], Andrew [name]. 10/23/2020 12:30 PM Medical Record Number: [id number] Patient Account Number: [id number] Date of Birth/Sex: Treating RN: 29-Apr-1928 (86 y.o. Andrew [name] Primary Care Anessa Charley: Dimas Chyle Other Clinician: Referring Rahi Chandonnet: Treating Catalia Massett/Extender: Donalee Citrin in Treatment:  6 Wound Status Wound Number: 5 Primary Diabetic Wound/Ulcer of the Lower Extremity Etiology: Wound Location: Left, Medial Lower Leg Wound Open Wounding Event: Gradually Appeared Status: Date Acquired: 10/23/2020 Comorbid Congestive Heart Failure, Coronary Artery Disease, Hypertension, Weeks Of Treatment: 0 History: Peripheral Arterial Disease, Peripheral Venous Disease, Type II Clustered Wound: No Diabetes Wound Measurements Length: (cm) 0.6 Width: (cm) 0.8 Depth: (cm) 0.1 Area: (cm) 0.377 Volume: (cm) 0.038 % Reduction in Area: % Reduction in Volume: Epithelialization: None Tunneling: No Undermining: No Wound Description Classification: Grade 2 Wound Margin: Distinct, outline attached Exudate Amount: Medium Exudate Type: Serosanguineous Exudate Color: red, brown Foul Odor After Cleansing: No Slough/Fibrino No Wound Bed Granulation Amount: Large (67-100%) Exposed Structure Granulation Quality: Red Fascia Exposed: No Necrotic Amount: None Present (0%) Fat Layer (Subcutaneous Tissue) Exposed: Yes Tendon Exposed: No Muscle Exposed: No Joint Exposed: No Bone Exposed: No Treatment Notes Wound #5 (Left, Medial Lower Leg) 1. Cleanse With Wound Cleanser Soap and water 2. Periwound Care Moisturizing lotion 3. Primary Dressing Applied Iodoflex 4. Secondary Dressing Dry Gauze 6. Support Layer Holiday representative) Signed: 10/23/2020 4:26:07 PM By: Deon Pilling Entered By: Deon Pilling on 10/23/2020 13:00:11 -------------------------------------------------------------------------------- Vitals Details Patient Name: Date of Service: Andrew [name], Andrew [name]. 10/23/2020 12:30 PM Medical Record Number: [id number] Patient Account Number: [id number] Date of Birth/Sex: Treating RN: [birth year] (86 y.o. Andrew [name] Primary Care Calianne Larue: Dimas Chyle Other Clinician: Referring Renaud Celli: Treating Reather Steller/Extender: Donalee Citrin in Treatment: 6 Vital Signs Time Taken: 12:42 Temperature (F): 97.3 Height (in): 67 Pulse (bpm): 62 Weight (lbs): 185 Respiratory Rate (breaths/min): 18 Body Mass Index (BMI): 29 Blood Pressure (mmHg): 113/65 Reference Range: 80 - 120 mg / dl Electronic Signature(s) Signed: 10/23/2020 4:26:07 PM By: Deon Pilling Entered By: Deon Pilling on 10/23/2020 12:57:39

## 2020-10-26 NOTE — Progress Notes (Signed)
Andrew, [name] ([id number]) Visit Report for 10/23/2020 Debridement Details Patient Name: Date of Service: Andrew [name], Andrew [facility] A. 10/23/2020 12:30 PM Medical Record Number: [id number] Patient Account Number: [id number] Date of Birth/Sex: Treating RN: 03-02-1928 (86 y.o. Andrew [name] Primary Care Provider: Dimas Chyle Other Clinician: Referring Provider: Treating Provider/Extender: Donalee Citrin in Treatment: 6 Debridement Performed for Assessment: Wound #2 Left Calcaneus Performed By: Physician Ricard Dillon., MD Debridement Type: Debridement Severity of Tissue Pre Debridement: Fat layer exposed Level of Consciousness (Pre-procedure): Awake and Alert Pre-procedure Verification/Time Out Yes - 13:10 Taken: Start Time: 13:12 Pain Control: Other : benzocaine 20% spray T Area Debrided (L x W): otal 1.3 (cm) x 2.6 (cm) = 3.38 (cm) Tissue and other material debrided: Viable, Non-Viable, Slough, Subcutaneous, Slough Level: Skin/Subcutaneous Tissue Debridement Description: Excisional Instrument: Curette Bleeding: Minimum Hemostasis Achieved: Pressure End Time: 13:15 Procedural Pain: 4 Post Procedural Pain: 2 Response to Treatment: Procedure was tolerated well Level of Consciousness (Post- Awake and Alert procedure): Post Debridement Measurements of Total Wound Length: (cm) 1.3 Width: (cm) 2.6 Depth: (cm) 0.2 Volume: (cm) 0.531 Character of Wound/Ulcer Post Debridement: Improved Severity of Tissue Post Debridement: Fat layer exposed Post Procedure Diagnosis Same as Pre-procedure Electronic Signature(s) Signed: 10/23/2020 4:23:42 PM By: Baruch Gouty RN, BSN Signed: 10/26/2020 2:10:44 PM By: Linton Ham MD Entered By: Linton Ham on 10/23/2020 13:36:17 -------------------------------------------------------------------------------- Debridement Details Patient Name: Date of Service: Andrew [name], Andrew [facility] A. 10/23/2020 12:30 PM Medical  Record Number: [id number] Patient Account Number: [id number] Date of Birth/Sex: Treating RN: [birth year] (86 y.o. Andrew [name] Primary Care Provider: Dimas Chyle Other Clinician: Referring Provider: Treating Provider/Extender: Donalee Citrin in Treatment: 6 Debridement Performed for Assessment: Wound #4 Right,Proximal,Anterior Lower Leg Performed By: Physician Ricard Dillon., MD Debridement Type: Debridement Severity of Tissue Pre Debridement: Fat layer exposed Level of Consciousness (Pre-procedure): Awake and Alert Pre-procedure Verification/Time Out Yes - 13:10 Taken: Start Time: 13:12 Pain Control: Other : benzocaine 20% spray T Area Debrided (L x W): otal 2.5 (cm) x 2.8 (cm) = 7 (cm) Tissue and other material debrided: Viable, Non-Viable, Slough, Subcutaneous, Slough Level: Skin/Subcutaneous Tissue Debridement Description: Excisional Instrument: Curette Bleeding: Minimum Hemostasis Achieved: Pressure End Time: 13:15 Procedural Pain: 4 Post Procedural Pain: 2 Response to Treatment: Procedure was tolerated well Level of Consciousness (Post- Awake and Alert procedure): Post Debridement Measurements of Total Wound Length: (cm) 2.5 Width: (cm) 2.8 Depth: (cm) 0.1 Volume: (cm) 0.55 Character of Wound/Ulcer Post Debridement: Improved Severity of Tissue Post Debridement: Fat layer exposed Post Procedure Diagnosis Same as Pre-procedure Electronic Signature(s) Signed: 10/23/2020 4:23:42 PM By: Baruch Gouty RN, BSN Signed: 10/26/2020 2:10:44 PM By: Linton Ham MD Entered By: Linton Ham on 10/23/2020 13:36:25 -------------------------------------------------------------------------------- HPI Details Patient Name: Date of Service: Andrew [name], Andrew [facility]. 10/23/2020 12:30 PM Medical Record Number: [id number] Patient Account Number: [id number] Date of Birth/Sex: Treating RN: 01-Aug-1928 (86 y.o. Andrew [name] Primary Care Provider:  Dimas Chyle Other Clinician: Referring Provider: Treating Provider/Extender: Donalee Citrin in Treatment: 6 History of Present Illness HPI Description: 09/11/2020 ADMISSION This is a soon-to-be 86 year old man who was hospitalized in June and subsequently spent time at [place] home. At some point he developed a pressure ulcer on his left heel. On discharge he was seen by podiatry and referred to Dr. Donzetta Matters with regards to the possibility of PAD. The patient underwent angiography on 08/10/2020 this showed heavily calcified vessels. The common iliac artery on  the left appeared to have calcific disease but there was no gradient. Left common external leg arteries were heavily tortuous left lower extremity limited angiography developed demonstrated a SFA to be helped heavily calcified throughout with multiple greater than 50% stenosis but never frankly occludes he was not felt to be a candidate for endovascular repair. I think they had a minimal look at the right side and it appeared that the SFA was occluded and reconstitutes above the knee. The overall feeling was that if he could not heal his left heel ulcer he will eventually best be served by a left above-knee amputation The last I can see is that he should have been using a Santyl wet-to-dry dressing to the heel. During his stay with podiatry was also noted that he had venous insufficiency and I think venous insufficiency wounds. His wife tells me that he has been recommended for compression stockings in the past but the patient refused, he has wounds on his bilateral anterior lower legs right greater than the left. Chronic venous inflammation and dry flaking skin. Past medical history; heart failure, right heel wound, paroxysmal atrial fibrillation on Eliquis, abdominal aortic aneurysm with previous repair, COPD, DVT, melanoma on his back, diabetes with a recent hemoglobin A1c of 7.3 however he is not on any  treatment [type II], PAD, coronary artery disease, hypertension The patient's most recent arterial studies were on 05/28/2020. This showed an ABI on the right at 1.12 but monophasic waveforms and absent waveforms at the posterior tibial artery. On the left his ABI was 1.47 [noncompressible] he had triphasic waveforms at the ATA and monophasic at the PTA. His TBI on the right was 1.12 on the left and 0.97. It was felt his ABIs were falsely elevated 9/24. The patient followed up with Dr. Donzetta Matters and had noninvasive arterial studies. On the right his ABIs were noncompressible monophasic waveforms with a TBI of 1.45 and a right great toe pressure of 172. On the left he had monophasic waveforms with noncompressible ABIs TBI of 0.79 and a left great toe pressure of 94. He will be seen in follow-up by Dr. Donzetta Matters in a month's time. The patient also has very significant chronic venous insufficiency 10/8; patient with known PAD. He came in with bilateral lower extremity wounds likely secondary to venous stasis. Still has one on the upper right tibial area. The area on the left leg is healed however he still has an area on the left heel. This needed an extensive debridement today. His wife brings in notes apparently Dr. March Rummage ordered an MRI of the left heel. I was unaware of this. I was not aware that he did not been seen by Dr. March Rummage and I do not have any records of this. Apparently MRI called to set up the appointment. I will need to look through epic on this 10/15; this is a patient with known PAD who was revascularized. He also has venous stasis disease. The area that was on the right anterior tibia has actually close down although he has a still a small area on the upper anterior tibia. 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 We have been  using Iodoflex on this area and he is required debridements. We tried to get her to agree to an MRI that have been ordered by  Dr. March Rummage. However she did not get back to the MRI people or they have not called her back. We will reorder this. He is quite tender around this wound and I wonder about osteomyelitis. 10/29; MRI. Fortunately this did not show any evidence of abscess or osteomyelitis in the left heel. Vein and vascular on 10/21; this is a patient who is previously had an angiogram. He had a heavily calcified aorta and iliac vessels as well as the SFA. He had multiple areas of diffuse 50% stenosis. No interventions were apparently done by Dr. Donzetta Matters he does have an occluded right SFA but with above-knee reconstitution. He was not felt to be a candidate for revascularization for his diffuse areas of greater than 50% stenosis. It was not felt that these were amenable to intervention Since he was last here he went to see dermatology. I believe he had skin cancer extractions from the right area anteriorly and 2 on the left medial. Electronic Signature(s) Signed: 10/26/2020 2:10:44 PM By: Linton Ham MD Entered By: Linton Ham on 10/23/2020 13:39:24 -------------------------------------------------------------------------------- Physical Exam Details Patient Name: Date of Service: Andrew [name], Andrew [facility] A. 10/23/2020 12:30 PM Medical Record Number: [id number] Patient Account Number: [id number] Date of Birth/Sex: Treating RN: [birth year] (86 y.o. Andrew [name] Primary Care Provider: Dimas Chyle Other Clinician: Referring Provider: Treating Provider/Extender: Donalee Citrin in Treatment: 6 Constitutional Sitting or standing Blood Pressure is within target range for patient.. Pulse regular and within target range for patient.Marland Kitchen Respirations regular, non-labored and within target range.. Temperature is normal and within the target range for the patient.Marland Kitchen Appears in no  distress. Notes Wound exam; surface of the wound has adherent subcutaneous debris which I removed with a #5 curette hemostasis with direct pressure. I am able to get the healthy looking tissue although we do not seem to be able to get this to epithelialize He has a new surgical wound on the right anterior mid tibia [extraction of a skin cancer] there is smaller areas on the left medial x2 Electronic Signature(s) Signed: 10/26/2020 2:10:44 PM By: Linton Ham MD Entered By: Linton Ham on 10/23/2020 13:40:43 -------------------------------------------------------------------------------- Physician Orders Details Patient Name: Date of Service: Andrew [name], Andrew [facility] A. 10/23/2020 12:30 PM Medical Record Number: [id number] Patient Account Number: [id number] Date of Birth/Sex: Treating RN: 05/17/1928 (86 y.o. Andrew [name] Primary Care Provider: Dimas Chyle Other Clinician: Referring Provider: Treating Provider/Extender: Donalee Citrin in Treatment: 6 Verbal / Phone Orders: No Diagnosis Coding ICD-10 Coding Code Description E11.621 Type 2 diabetes mellitus with foot ulcer L97.422 Non-pressure chronic ulcer of left heel and midfoot with fat layer exposed I87.333 Chronic venous hypertension (idiopathic) with ulcer and inflammation of bilateral lower extremity L97.811 Non-pressure chronic ulcer of other part of right lower leg limited to breakdown of skin Follow-up Appointments Return Appointment in 2 weeks. Dressing Change Frequency Other: - HH to change twice a week on weeks that patient is not coming to wound care clinic Skin Barriers/Peri-Wound Care Moisturizing lotion - mixed with TCA cream to legs TCA Cream or Ointment Wound Cleansing May shower with protection. - cast protector Primary Wound Dressing Wound #2 Left Calcaneus Iodoflex Wound #4 Right,Proximal,Anterior Lower Leg Iodoflex Wound #5 Left,Medial Lower Leg Iodoflex Secondary  Dressing Wound #2 Left Calcaneus Dry Gauze Heel Cup - on left heel Wound #4 Right,Proximal,Anterior Lower Leg Dry Gauze ABD pad - as needed Wound #5 Left,Medial Lower Leg Dry Gauze Edema Control Kerlix and Coban - Bilateral - wrap  from base of toes to tibial tuberosity Avoid standing for long periods of time Elevate legs to the level of the heart or above for 30 minutes daily and/or when sitting, a frequency of: Exercise regularly [facility] for wound care. - MediHome Patient Medications llergies: metoprolol, metformin A Notifications Medication Indication Start End prior to debridement 10/23/2020 benzocaine DOSE topical 20 % aerosol - aerosol topical Electronic Signature(s) Signed: 10/23/2020 4:23:42 PM By: Baruch Gouty RN, BSN Signed: 10/26/2020 2:10:44 PM By: Linton Ham MD Entered By: Baruch Gouty on 10/23/2020 13:20:53 -------------------------------------------------------------------------------- Problem List Details Patient Name: Date of Service: Andrew [name], Andrew [facility] A. 10/23/2020 12:30 PM Medical Record Number: [id number] Patient Account Number: [id number] Date of Birth/Sex: Treating RN: 25-Feb-1928 (86 y.o. Andrew [name] Primary Care Provider: Dimas Chyle Other Clinician: Referring Provider: Treating Provider/Extender: Donalee Citrin in Treatment: 6 Active Problems ICD-10 Encounter Code Description Active Date MDM Diagnosis E11.621 Type 2 diabetes mellitus with foot ulcer 09/11/2020 No Yes L97.422 Non-pressure chronic ulcer of left heel and midfoot with fat layer exposed 09/11/2020 No Yes I87.333 Chronic venous hypertension (idiopathic) with ulcer and inflammation of 09/11/2020 No Yes bilateral lower extremity L97.811 Non-pressure chronic ulcer of other part of right lower leg limited to breakdown 09/11/2020 No Yes of skin L97.818 Non-pressure chronic ulcer of other part of right lower leg  with other specified 10/23/2020 No Yes severity L97.829 Non-pressure chronic ulcer of other part of left lower leg with unspecified 10/23/2020 No Yes severity T81.31XA Disruption of external operation (surgical) wound, not elsewhere classified, 10/23/2020 No Yes initial encounter Inactive Problems ICD-10 Code Description Active Date Inactive Date L97.821 Non-pressure chronic ulcer of other part of left lower leg limited to breakdown of skin 09/11/2020 09/11/2020 Resolved Problems Electronic Signature(s) Signed: 10/26/2020 2:10:44 PM By: Linton Ham MD Entered By: Linton Ham on 10/23/2020 13:36:00 -------------------------------------------------------------------------------- Progress Note Details Patient Name: Date of Service: Andrew [name], Andrew [facility] A. 10/23/2020 12:30 PM Medical Record Number: [id number] Patient Account Number: [id number] Date of Birth/Sex: Treating RN: 08-01-1928 (86 y.o. Andrew [name] Primary Care Provider: Dimas Chyle Other Clinician: Referring Provider: Treating Provider/Extender: Donalee Citrin in Treatment: 6 Subjective History of Present Illness (HPI) 09/11/2020 ADMISSION This is a soon-to-be 86 year old man who was hospitalized in June and subsequently spent time at [place] home. At some point he developed a pressure ulcer on his left heel. On discharge he was seen by podiatry and referred to Dr. Donzetta Matters with regards to the possibility of PAD. The patient underwent angiography on 08/10/2020 this showed heavily calcified vessels. The common iliac artery on the left appeared to have calcific disease but there was no gradient. Left common external leg arteries were heavily tortuous left lower extremity limited angiography developed demonstrated a SFA to be helped heavily calcified throughout with multiple greater than 50% stenosis but never frankly occludes he was not felt to be a candidate for endovascular repair. I think  they had a minimal look at the right side and it appeared that the SFA was occluded and reconstitutes above the knee. The overall feeling was that if he could not heal his left heel ulcer he will eventually best be served by a left above-knee amputation The last I can see is that he should have been using a Santyl wet-to-dry dressing to the heel. During his stay with podiatry was also noted that he had venous insufficiency and I think venous insufficiency wounds. His wife tells me that  he has been recommended for compression stockings in the past but the patient refused, he has wounds on his bilateral anterior lower legs right greater than the left. Chronic venous inflammation and dry flaking skin. Past medical history; heart failure, right heel wound, paroxysmal atrial fibrillation on Eliquis, abdominal aortic aneurysm with previous repair, COPD, DVT, melanoma on his back, diabetes with a recent hemoglobin A1c of 7.3 however he is not on any treatment [type II], PAD, coronary artery disease, hypertension The patient's most recent arterial studies were on 05/28/2020. This showed an ABI on the right at 1.12 but monophasic waveforms and absent waveforms at the posterior tibial artery. On the left his ABI was 1.47 [noncompressible] he had triphasic waveforms at the ATA and monophasic at the PTA. His TBI on the right was 1.12 on the left and 0.97. It was felt his ABIs were falsely elevated 9/24. The patient followed up with Dr. Donzetta Matters and had noninvasive arterial studies. On the right his ABIs were noncompressible monophasic waveforms with a TBI of 1.45 and a right great toe pressure of 172. On the left he had monophasic waveforms with noncompressible ABIs TBI of 0.79 and a left great toe pressure of 94. He will be seen in follow-up by Dr. Donzetta Matters in a month's time. The patient also has very significant chronic venous insufficiency 10/8; patient with known PAD. He came in with bilateral lower extremity wounds  likely secondary to venous stasis. Still has one on the upper right tibial area. The area on the left leg is healed however he still has an area on the left heel. This needed an extensive debridement today. His wife brings in notes apparently Dr. March Rummage ordered an MRI of the left heel. I was unaware of this. I was not aware that he did not been seen by Dr. March Rummage and I do not have any records of this. Apparently MRI called to set up the appointment. I will need to look through epic on this 10/15; this is a patient with known PAD who was revascularized. He also has venous stasis disease. The area that was on the right anterior tibia has actually close down although he has a still a small area on the upper anterior tibia. 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 We have been using Iodoflex on this area and he is required debridements. We tried to get her to agree to an MRI that have been ordered by Dr. March Rummage. However she did not get back to the MRI people or they have not called her back. We will reorder this. He is quite tender around this wound and I wonder about osteomyelitis. 10/29; MRI. Fortunately this did not show any evidence of abscess or osteomyelitis in the left heel. Vein and vascular on 10/21; this is a patient who is previously had an angiogram. He had a heavily calcified aorta and iliac vessels as well as the SFA. He had multiple areas of diffuse 50% stenosis. No interventions were apparently done by Dr. Donzetta Matters he does have an occluded right SFA but with above-knee reconstitution. He was not felt to be a candidate for revascularization for his diffuse areas of greater than 50% stenosis. It was not felt that these were amenable to intervention Since he was last here he went to see dermatology. I believe  he had skin  cancer extractions from the right area anteriorly and 2 on the left medial. Objective Constitutional Sitting or standing Blood Pressure is within target range for patient.. Pulse regular and within target range for patient.Marland Kitchen Respirations regular, non-labored and within target range.. Temperature is normal and within the target range for the patient.Marland Kitchen Appears in no distress. Vitals Time Taken: 12:42 PM, Height: 67 in, Weight: 185 lbs, BMI: 29, Temperature: 97.3 F, Pulse: 62 bpm, Respiratory Rate: 18 breaths/min, Blood Pressure: 113/65 mmHg. General Notes: Wound exam; surface of the wound has adherent subcutaneous debris which I removed with a #5 curette hemostasis with direct pressure. I am able to get the healthy looking tissue although we do not seem to be able to get this to epithelialize [facility] has a new surgical wound on the right anterior mid tibia [extraction of a skin cancer] there is smaller areas on the left medial x2 Integumentary (Hair, Skin) Wound #2 status is Open. Original cause of wound was Gradually Appeared. The wound is located on the Left Calcaneus. The wound measures 1.3cm length x 2.6cm width x 0.1cm depth; 2.655cm^2 area and 0.265cm^3 volume. There is Fat Layer (Subcutaneous Tissue) exposed. There is no tunneling or undermining noted. There is a medium amount of serosanguineous drainage noted. The wound margin is flat and intact. There is small (1-33%) pink granulation within the wound bed. There is a large (67-100%) amount of necrotic tissue within the wound bed including Adherent Slough. Wound #4 status is Open. Original cause of wound was Gradually Appeared. The wound is located on the Right,Proximal,Anterior Lower Leg. The wound measures 2.5cm length x 2.8cm width x 0.1cm depth; 5.498cm^2 area and 0.55cm^3 volume. There is Fat Layer (Subcutaneous Tissue) exposed. There is no tunneling or undermining noted. There is a medium amount of serosanguineous drainage noted. The  wound margin is flat and intact. There is medium (34-66%) red granulation within the wound bed. There is a medium (34-66%) amount of necrotic tissue within the wound bed. Wound #5 status is Open. Original cause of wound was Gradually Appeared. The wound is located on the Left,Medial Lower Leg. The wound measures 0.6cm length x 0.8cm width x 0.1cm depth; 0.377cm^2 area and 0.038cm^3 volume. There is Fat Layer (Subcutaneous Tissue) exposed. There is no tunneling or undermining noted. There is a medium amount of serosanguineous drainage noted. The wound margin is distinct with the outline attached to the wound base. There is large (67-100%) red granulation within the wound bed. There is no necrotic tissue within the wound bed. Assessment Active Problems ICD-10 Type 2 diabetes mellitus with foot ulcer Non-pressure chronic ulcer of left heel and midfoot with fat layer exposed Chronic venous hypertension (idiopathic) with ulcer and inflammation of bilateral lower extremity Non-pressure chronic ulcer of other part of right lower leg limited to breakdown of skin Non-pressure chronic ulcer of other part of right lower leg with other specified severity Non-pressure chronic ulcer of other part of left lower leg with unspecified severity Disruption of external operation (surgical) wound, not elsewhere classified, initial encounter Procedures Wound #2 Pre-procedure diagnosis of Wound #2 is a Diabetic Wound/Ulcer of the Lower Extremity located on the Left Calcaneus .Severity of Tissue Pre Debridement is: Fat layer exposed. There was a Excisional Skin/Subcutaneous Tissue Debridement with a total area of 3.38 sq cm performed by Ricard Dillon., MD. With the following instrument(s): Curette to remove Viable and Non-Viable tissue/material. Material removed includes Subcutaneous Tissue and Slough and after achieving pain control using Other (  benzocaine 20% spray). No specimens were taken. A time out was  conducted at 13:10, prior to the start of the procedure. A Minimum amount of bleeding was controlled with Pressure. The procedure was tolerated well with a pain level of 4 throughout and a pain level of 2 following the procedure. Post Debridement Measurements: 1.3cm length x 2.6cm width x 0.2cm depth; 0.531cm^3 volume. Character of Wound/Ulcer Post Debridement is improved. Severity of Tissue Post Debridement is: Fat layer exposed. Post procedure Diagnosis Wound #2: Same as Pre-Procedure Wound #4 Pre-procedure diagnosis of Wound #4 is a Venous Leg Ulcer located on the Right,Proximal,Anterior Lower Leg .Severity of Tissue Pre Debridement is: Fat layer exposed. There was a Excisional Skin/Subcutaneous Tissue Debridement with a total area of 7 sq cm performed by Ricard Dillon., MD. With the following instrument(s): Curette to remove Viable and Non-Viable tissue/material. Material removed includes Subcutaneous Tissue and Slough and after achieving pain control using Other (benzocaine 20% spray). No specimens were taken. A time out was conducted at 13:10, prior to the start of the procedure. A Minimum amount of bleeding was controlled with Pressure. The procedure was tolerated well with a pain level of 4 throughout and a pain level of 2 following the procedure. Post Debridement Measurements: 2.5cm length x 2.8cm width x 0.1cm depth; 0.55cm^3 volume. Character of Wound/Ulcer Post Debridement is improved. Severity of Tissue Post Debridement is: Fat layer exposed. Post procedure Diagnosis Wound #4: Same as Pre-Procedure Plan Follow-up Appointments: Return Appointment in 2 weeks. Dressing Change Frequency: Other: - HH to change twice a week on weeks that patient is not coming to wound care clinic Skin Barriers/Peri-Wound Care: Moisturizing lotion - mixed with TCA cream to legs TCA Cream or Ointment Wound Cleansing: May shower with protection. - cast protector Primary Wound Dressing: Wound #2 Left  Calcaneus: Iodoflex Wound #4 Right,Proximal,Anterior Lower Leg: Iodoflex Wound #5 Left,Medial Lower Leg: Iodoflex Secondary Dressing: Wound #2 Left Calcaneus: Dry Gauze Heel Cup - on left heel Wound #4 Right,Proximal,Anterior Lower Leg: Dry Gauze ABD pad - as needed Wound #5 Left,Medial Lower Leg: Dry Gauze Edema Control: Kerlix and Coban - Bilateral - wrap from base of toes to tibial tuberosity Avoid standing for long periods of time Elevate legs to the level of the heart or above for 30 minutes daily and/or when sitting, a frequency of: Exercise regularly Home Health: [facility] skilled [facility] for wound care. - [facility] The following medication(s) was prescribed: benzocaine topical 20 % aerosol aerosol topical for prior to debridement was prescribed at facility 1. Iodoflex to all wound areas under kerlix coban compression bilaterally 2. I have reviewed his vein and vascular notes he is not a candidate for any interventions he has PAD but I think there is enough blood flow to heal the left heel at least that is been my operating premise. 3. He also has chronic venous insufficiency which is the reason for mild compression 4. I reviewed his MRI he does not have any osteomyelitis or abscess in the left heel which is fortunate Electronic Signature(s) Signed: 10/26/2020 2:10:44 PM By: Linton Ham MD Entered By: Linton Ham on 10/23/2020 13:42:19 -------------------------------------------------------------------------------- SuperBill Details Patient Name: Date of Service: Andrew [name], Andrew RGE A. 10/23/2020 Medical Record Number: [id number] Patient Account Number: [id number] Date of Birth/Sex: Treating RN: [birth year] (86 y.o. Andrew [name] Primary Care Provider: Dimas Chyle Other Clinician: Referring Provider: Treating Provider/Extender: Donalee Citrin in Treatment: 6 Diagnosis Coding ICD-10 Codes Code Description [phone] Type 2  diabetes mellitus with foot ulcer L97.422 Non-pressure chronic ulcer of left heel and midfoot with fat layer exposed I87.333 Chronic venous hypertension (idiopathic) with ulcer and inflammation of bilateral lower extremity L97.811 Non-pressure chronic ulcer of other part of right lower leg limited to breakdown of skin Facility Procedures CPT4 Code: [id number] Description: 96295 - DEB SUBQ TISSUE 20 SQ CM/< ICD-10 Diagnosis Description L97.422 Non-pressure chronic ulcer of left heel and midfoot with fat layer exposed Modifier: Quantity: 1 Physician Procedures : CPT4 Code Description Modifier [id number] - WC PHYS SUBQ TISS 20 SQ CM ICD-10 Diagnosis Description L97.422 Non-pressure chronic ulcer of left heel and midfoot with fat layer exposed Quantity: 1 Electronic Signature(s) Signed: 10/26/2020 2:10:44 PM By: Linton Ham MD Entered By: Linton Ham on 10/23/2020 13:43:05

## 2020-10-27 DIAGNOSIS — I5022 Chronic systolic (congestive) heart failure: Secondary | ICD-10-CM | POA: Diagnosis not present

## 2020-10-27 DIAGNOSIS — L89620 Pressure ulcer of left heel, unstageable: Secondary | ICD-10-CM | POA: Diagnosis not present

## 2020-10-27 DIAGNOSIS — E1151 Type 2 diabetes mellitus with diabetic peripheral angiopathy without gangrene: Secondary | ICD-10-CM | POA: Diagnosis not present

## 2020-10-27 DIAGNOSIS — I130 Hypertensive heart and chronic kidney disease with heart failure and stage 1 through stage 4 chronic kidney disease, or unspecified chronic kidney disease: Secondary | ICD-10-CM | POA: Diagnosis not present

## 2020-10-27 DIAGNOSIS — I872 Venous insufficiency (chronic) (peripheral): Secondary | ICD-10-CM | POA: Diagnosis not present

## 2020-10-27 DIAGNOSIS — N184 Chronic kidney disease, stage 4 (severe): Secondary | ICD-10-CM | POA: Diagnosis not present

## 2020-10-27 DIAGNOSIS — E1122 Type 2 diabetes mellitus with diabetic chronic kidney disease: Secondary | ICD-10-CM | POA: Diagnosis not present

## 2020-10-27 DIAGNOSIS — E1169 Type 2 diabetes mellitus with other specified complication: Secondary | ICD-10-CM | POA: Diagnosis not present

## 2020-10-27 DIAGNOSIS — D631 Anemia in chronic kidney disease: Secondary | ICD-10-CM | POA: Diagnosis not present

## 2020-10-29 ENCOUNTER — Other Ambulatory Visit: Payer: Self-pay | Admitting: Family Medicine

## 2020-10-29 ENCOUNTER — Other Ambulatory Visit (HOSPITAL_COMMUNITY): Payer: Self-pay

## 2020-10-29 NOTE — Progress Notes (Signed)
I arrived at Mr Okey' home this afternoon for our regularly scheduled visit. Upon my arrival his wife advised that he has not been sleeping well the past few nights so he was taking a nap. Given the circumstances I chose to let him rest and did not obtain vital signs or a physical assessment. His medications were verified and his pillboxes were refilled. I will follow up in two weeks.  [name], EMT 10/29/20

## 2020-10-30 DIAGNOSIS — E1169 Type 2 diabetes mellitus with other specified complication: Secondary | ICD-10-CM | POA: Diagnosis not present

## 2020-10-30 DIAGNOSIS — N184 Chronic kidney disease, stage 4 (severe): Secondary | ICD-10-CM | POA: Diagnosis not present

## 2020-10-30 DIAGNOSIS — D631 Anemia in chronic kidney disease: Secondary | ICD-10-CM | POA: Diagnosis not present

## 2020-10-30 DIAGNOSIS — I5022 Chronic systolic (congestive) heart failure: Secondary | ICD-10-CM | POA: Diagnosis not present

## 2020-10-30 DIAGNOSIS — I872 Venous insufficiency (chronic) (peripheral): Secondary | ICD-10-CM | POA: Diagnosis not present

## 2020-10-30 DIAGNOSIS — I130 Hypertensive heart and chronic kidney disease with heart failure and stage 1 through stage 4 chronic kidney disease, or unspecified chronic kidney disease: Secondary | ICD-10-CM | POA: Diagnosis not present

## 2020-10-30 DIAGNOSIS — E1122 Type 2 diabetes mellitus with diabetic chronic kidney disease: Secondary | ICD-10-CM | POA: Diagnosis not present

## 2020-10-30 DIAGNOSIS — L89620 Pressure ulcer of left heel, unstageable: Secondary | ICD-10-CM | POA: Diagnosis not present

## 2020-10-30 DIAGNOSIS — E1151 Type 2 diabetes mellitus with diabetic peripheral angiopathy without gangrene: Secondary | ICD-10-CM | POA: Diagnosis not present

## 2020-11-02 DIAGNOSIS — L89620 Pressure ulcer of left heel, unstageable: Secondary | ICD-10-CM | POA: Diagnosis not present

## 2020-11-02 DIAGNOSIS — I130 Hypertensive heart and chronic kidney disease with heart failure and stage 1 through stage 4 chronic kidney disease, or unspecified chronic kidney disease: Secondary | ICD-10-CM | POA: Diagnosis not present

## 2020-11-02 DIAGNOSIS — I872 Venous insufficiency (chronic) (peripheral): Secondary | ICD-10-CM | POA: Diagnosis not present

## 2020-11-02 DIAGNOSIS — D631 Anemia in chronic kidney disease: Secondary | ICD-10-CM | POA: Diagnosis not present

## 2020-11-02 DIAGNOSIS — E1169 Type 2 diabetes mellitus with other specified complication: Secondary | ICD-10-CM | POA: Diagnosis not present

## 2020-11-02 DIAGNOSIS — E1122 Type 2 diabetes mellitus with diabetic chronic kidney disease: Secondary | ICD-10-CM | POA: Diagnosis not present

## 2020-11-02 DIAGNOSIS — I5022 Chronic systolic (congestive) heart failure: Secondary | ICD-10-CM | POA: Diagnosis not present

## 2020-11-02 DIAGNOSIS — N184 Chronic kidney disease, stage 4 (severe): Secondary | ICD-10-CM | POA: Diagnosis not present

## 2020-11-02 DIAGNOSIS — E1151 Type 2 diabetes mellitus with diabetic peripheral angiopathy without gangrene: Secondary | ICD-10-CM | POA: Diagnosis not present

## 2020-11-03 ENCOUNTER — Encounter (HOSPITAL_COMMUNITY): Payer: Medicare PPO | Admitting: Cardiology

## 2020-11-06 ENCOUNTER — Other Ambulatory Visit: Payer: Self-pay

## 2020-11-06 ENCOUNTER — Encounter (HOSPITAL_BASED_OUTPATIENT_CLINIC_OR_DEPARTMENT_OTHER): Payer: Medicare PPO | Attending: Internal Medicine | Admitting: Internal Medicine

## 2020-11-06 DIAGNOSIS — E11621 Type 2 diabetes mellitus with foot ulcer: Secondary | ICD-10-CM | POA: Diagnosis not present

## 2020-11-06 DIAGNOSIS — T8131XA Disruption of external operation (surgical) wound, not elsewhere classified, initial encounter: Secondary | ICD-10-CM | POA: Insufficient documentation

## 2020-11-06 DIAGNOSIS — L97422 Non-pressure chronic ulcer of left heel and midfoot with fat layer exposed: Secondary | ICD-10-CM | POA: Insufficient documentation

## 2020-11-06 DIAGNOSIS — Z85828 Personal history of other malignant neoplasm of skin: Secondary | ICD-10-CM | POA: Insufficient documentation

## 2020-11-06 DIAGNOSIS — L97811 Non-pressure chronic ulcer of other part of right lower leg limited to breakdown of skin: Secondary | ICD-10-CM | POA: Insufficient documentation

## 2020-11-06 DIAGNOSIS — L97818 Non-pressure chronic ulcer of other part of right lower leg with other specified severity: Secondary | ICD-10-CM | POA: Diagnosis not present

## 2020-11-06 DIAGNOSIS — L97829 Non-pressure chronic ulcer of other part of left lower leg with unspecified severity: Secondary | ICD-10-CM | POA: Diagnosis not present

## 2020-11-06 DIAGNOSIS — L97812 Non-pressure chronic ulcer of other part of right lower leg with fat layer exposed: Secondary | ICD-10-CM | POA: Diagnosis not present

## 2020-11-06 DIAGNOSIS — Y838 Other surgical procedures as the cause of abnormal reaction of the patient, or of later complication, without mention of misadventure at the time of the procedure: Secondary | ICD-10-CM | POA: Insufficient documentation

## 2020-11-06 DIAGNOSIS — I87333 Chronic venous hypertension (idiopathic) with ulcer and inflammation of bilateral lower extremity: Secondary | ICD-10-CM | POA: Insufficient documentation

## 2020-11-09 DIAGNOSIS — I5022 Chronic systolic (congestive) heart failure: Secondary | ICD-10-CM | POA: Diagnosis not present

## 2020-11-09 DIAGNOSIS — I130 Hypertensive heart and chronic kidney disease with heart failure and stage 1 through stage 4 chronic kidney disease, or unspecified chronic kidney disease: Secondary | ICD-10-CM | POA: Diagnosis not present

## 2020-11-09 DIAGNOSIS — E1151 Type 2 diabetes mellitus with diabetic peripheral angiopathy without gangrene: Secondary | ICD-10-CM | POA: Diagnosis not present

## 2020-11-09 DIAGNOSIS — L89620 Pressure ulcer of left heel, unstageable: Secondary | ICD-10-CM | POA: Diagnosis not present

## 2020-11-09 DIAGNOSIS — I872 Venous insufficiency (chronic) (peripheral): Secondary | ICD-10-CM | POA: Diagnosis not present

## 2020-11-09 DIAGNOSIS — D631 Anemia in chronic kidney disease: Secondary | ICD-10-CM | POA: Diagnosis not present

## 2020-11-09 DIAGNOSIS — E1122 Type 2 diabetes mellitus with diabetic chronic kidney disease: Secondary | ICD-10-CM | POA: Diagnosis not present

## 2020-11-09 DIAGNOSIS — N184 Chronic kidney disease, stage 4 (severe): Secondary | ICD-10-CM | POA: Diagnosis not present

## 2020-11-09 DIAGNOSIS — E1169 Type 2 diabetes mellitus with other specified complication: Secondary | ICD-10-CM | POA: Diagnosis not present

## 2020-11-09 NOTE — Progress Notes (Signed)
SIE, FORMISANO ([id number]) Visit Report for 11/06/2020 Arrival Information Details Patient Name: Date of Service: Andrew [name], GEO [facility] A. 11/06/2020 12:30 PM Medical Record Number: [id number] Patient Account Number: [id number] Date of Birth/Sex: Treating RN: 11/28/1928 (86 y.o. [name] Primary Care Ethell Blatchford: Dimas Chyle Other Clinician: Referring Lounell Schumacher: Treating Nishat Livingston/Extender: Donalee Citrin in Treatment: 8 Visit Information History Since Last Visit Added or deleted any medications: No Patient Arrived: Wheel Chair Any new allergies or adverse reactions: No Arrival Time: 13:00 Had a fall or experienced change in No Accompanied By: wife activities of daily living that may affect Transfer Assistance: None risk of falls: Patient Identification Verified: Yes Signs or symptoms of abuse/neglect since last visito No Secondary Verification Process Completed: Yes Hospitalized since last visit: No Patient Requires Transmission-Based Precautions: No Implantable device outside of the clinic excluding No Patient Has Alerts: No cellular tissue based products placed in the center since last visit: Has Dressing in Place as Prescribed: Yes Pain Present Now: No Electronic Signature(s) Signed: 11/09/2020 1:12:05 PM By: Sandre Kitty Entered By: Sandre Kitty on 11/06/2020 13:01:01 -------------------------------------------------------------------------------- Encounter Discharge Information Details Patient Name: Date of Service: Andrew [name], [name] 11/06/2020 12:30 PM Medical Record Number: [id number] Patient Account Number: [id number] Date of Birth/Sex: Treating RN: 06/19/1928 (86 y.o. [name] Primary Care Comfort Iversen: Dimas Chyle Other Clinician: Referring Willona Phariss: Treating Ronya Gilcrest/Extender: Donalee Citrin in Treatment: 8 Encounter Discharge Information Items Post Procedure Vitals Discharge Condition:  Stable Temperature (F): 98.5 Ambulatory Status: Wheelchair Pulse (bpm): 63 Discharge Destination: Home Respiratory Rate (breaths/min): 18 Transportation: Private Auto Blood Pressure (mmHg): 113/56 Accompanied By: self Schedule Follow-up Appointment: Yes Clinical Summary of Care: Electronic Signature(s) Signed: 11/06/2020 5:09:42 PM By: Deon Pilling Entered By: Deon Pilling on 11/06/2020 14:42:02 -------------------------------------------------------------------------------- Lower Extremity Assessment Details Patient Name: Date of Service: Andrew [name], [name] 11/06/2020 12:30 PM Medical Record Number: [id number] Patient Account Number: [id number] Date of Birth/Sex: Treating RN: January 14, 1928 (86 y.o. [name] Primary Care Cleatis Fandrich: Dimas Chyle Other Clinician: Referring Jalesha Plotz: Treating Kaaliyah Kita/Extender: Donalee Citrin in Treatment: 8 Edema Assessment Assessed: Shirlyn Goltz: Yes] Patrice Paradise: Yes] Edema: [Left: No] [Right: No] Calf Left: Right: Point of Measurement: 44 cm From Medial Instep 31 cm 33 cm Ankle Left: Right: Point of Measurement: 10 cm From Medial Instep 20 cm 20.5 cm Vascular Assessment Pulses: Dorsalis Pedis Palpable: [Left:Yes] [Right:Yes] Electronic Signature(s) Signed: 11/06/2020 5:09:42 PM By: Deon Pilling Entered By: Deon Pilling on 11/06/2020 13:33:45 -------------------------------------------------------------------------------- Multi Wound Chart Details Patient Name: Date of Service: Andrew [name], [name] 11/06/2020 12:30 PM Medical Record Number: [id number] Patient Account Number: [id number] Date of Birth/Sex: Treating RN: [birth year] (86 y.o. [name] Primary Care Hailey Stormer: Dimas Chyle Other Clinician: Referring Kaylei Frink: Treating Phyliss Hulick/Extender: Donalee Citrin in Treatment: 8 Vital Signs Height(in): 79 Pulse(bpm): 101 Weight(lbs): 185 Blood Pressure(mmHg): 113/56 Body Mass  Index(BMI): 29 Temperature(F): 98.5 Respiratory Rate(breaths/min): 18 Photos: [2:No Photos Left Calcaneus] [4:No Photos Right, Proximal, Anterior Lower Leg] [5:No Photos Left, Medial Lower Leg] Wound Location: [2:Gradually Appeared] [4:Gradually Appeared] [5:Gradually Appeared] Wounding Event: [2:Diabetic Wound/Ulcer of the Lower] [4:Venous Leg Ulcer] [5:Diabetic Wound/Ulcer of the Lower] Primary Etiology: [2:Extremity Congestive Heart Failure, Coronary] [4:Congestive Heart Failure, Coronary Congestive Heart Failure, Coronary] [5:Extremity] Comorbid History: [2:Artery Disease, Hypertension, Peripheral Arterial Disease, Peripheral Venous Disease, Type II Diabetes 05/26/2020] [4:Artery Disease, Hypertension, Peripheral Arterial Disease, Peripheral Peripheral Arterial Disease, Peripheral Venous  Disease, Type II Diabetes 10/09/2020] [5:Artery Disease, Hypertension, Venous  Disease, Type II Diabetes 10/23/2020] Date Acquired: [2:8] [4:4] [5:2] Weeks of Treatment: [2:Open] [4:Open] [5:Open] Wound Status: [2:1x2x0.1] [4:2.6x2.5x0.1] [5:0.3x0.4x0.1] Measurements L x W x D (cm) [2:1.571] [4:5.105] [5:0.094] A (cm) : rea [2:0.157] [4:0.511] [5:0.009] Volume (cm) : [2:76.50%] [4:-5330.90%] [5:75.10%] % Reduction in Area: [2:76.50%] [4:-5577.80%] [5:76.30%] % Reduction in Volume: [2:Grade 2] [4:Full Thickness Without Exposed] [5:Grade 2] Classification: [2:Medium] [4:Support Structures Medium] [5:Medium] Exudate A mount: [2:Serosanguineous] [4:Serosanguineous] [5:Serosanguineous] Exudate Type: [2:red, brown] [4:red, brown] [5:red, brown] Exudate Color: [2:Flat and Intact] [4:Flat and Intact] [5:Distinct, outline attached] Wound Margin: [2:Large (67-100%)] [4:Medium (34-66%)] [5:Large (67-100%)] Granulation A mount: [2:Pink] [4:Red] [5:Red] Granulation Quality: [2:Small (1-33%)] [4:Medium (34-66%)] [5:Small (1-33%)] Necrotic A mount: [2:Fat Layer (Subcutaneous Tissue): Yes Fat Layer (Subcutaneous  Tissue): Yes Fat Layer (Subcutaneous Tissue): Yes] Exposed Structures: [2:Fascia: No Tendon: No Muscle: No Joint: No Bone: No Small (1-33%)] [4:Fascia: No Tendon: No Muscle: No Joint: No Bone: No Small (1-33%)] [5:Fascia: No Tendon: No Muscle: No Joint: No Bone: No Small (1-33%)] Epithelialization: [2:Debridement - Excisional] [4:Debridement - Excisional] [5:N/A] Debridement: Pre-procedure Verification/Time Out 13:45 [4:13:45] [5:N/A] Taken: [2:Other] [4:Other] [5:N/A] Pain Control: [2:Subcutaneous, Slough] [4:Subcutaneous, Slough] [5:N/A] Tissue Debrided: [2:Skin/Subcutaneous Tissue] [4:Skin/Subcutaneous Tissue] [5:N/A] Level: [2:2] [4:6.5] [5:N/A] Debridement A (sq cm): [2:rea Curette] [4:Curette] [5:N/A] Instrument: [2:Minimum] [4:Minimum] [5:N/A] Bleeding: [2:Pressure] [4:Pressure] [5:N/A] Hemostasis A chieved: [2:0] [4:6] [5:N/A] Procedural Pain: [2:0] [4:2] [5:N/A] Post Procedural Pain: [2:Procedure was tolerated well] [4:Procedure was tolerated well] [5:N/A] Debridement Treatment Response: [2:1x2x0.1] [4:2.6x2.5x0.1] [5:N/A] Post Debridement Measurements L x W x D (cm) [2:0.157] [4:0.511] [5:N/A] Post Debridement Volume: (cm) [2:Debridement] [4:Debridement] [5:N/A] Treatment Notes Electronic Signature(s) Signed: 11/06/2020 5:58:22 PM By: Baruch Gouty RN, BSN Signed: 11/09/2020 9:07:52 AM By: Linton Ham MD Entered By: Linton Ham on 11/06/2020 13:58:27 -------------------------------------------------------------------------------- Multi-Disciplinary Care Plan Details Patient Name: Date of Service: Andrew [name], [name] 11/06/2020 12:30 PM Medical Record Number: [id number] Patient Account Number: [id number] Date of Birth/Sex: Treating RN: Nov 05, 1928 (86 y.o. [name] Primary Care Bader Stubblefield: Dimas Chyle Other Clinician: Referring Azuree Minish: Treating Taylia Berber/Extender: Donalee Citrin in Treatment: 8 Active Inactive Pain, Acute or  Chronic Nursing Diagnoses: Pain, acute or chronic: actual or potential Goals: Patient/caregiver will verbalize adequate pain control between visits Date Initiated: 09/11/2020 Target Resolution Date: 11/20/2020 Goal Status: Active Interventions: Provide education on pain management Notes: Wound/Skin Impairment Nursing Diagnoses: Impaired tissue integrity Goals: Patient/caregiver will verbalize understanding of skin care regimen Date Initiated: 10/23/2020 Target Resolution Date: 11/20/2020 Goal Status: Active Ulcer/skin breakdown will have a volume reduction of 50% by week 8 Date Initiated: 09/11/2020 Date Inactivated: 10/23/2020 Target Resolution Date: 10/16/2020 Goal Status: Unmet Unmet Reason: PAD Interventions: Provide education on ulcer and skin care Notes: Electronic Signature(s) Signed: 11/06/2020 5:58:22 PM By: Baruch Gouty RN, BSN Entered By: Baruch Gouty on 11/06/2020 13:48:00 -------------------------------------------------------------------------------- Pain Assessment Details Patient Name: Date of Service: Andrew [name], [name] 11/06/2020 12:30 PM Medical Record Number: [id number] Patient Account Number: [id number] Date of Birth/Sex: Treating RN: August 12, 1928 (86 y.o. [name] Primary Care Latara Micheli: Dimas Chyle Other Clinician: Referring Anzel Kearse: Treating Jafeth Mustin/Extender: Donalee Citrin in Treatment: 8 Active Problems Location of Pain Severity and Description of Pain Patient Has Paino No Site Locations Pain Management and Medication Current Pain Management: Electronic Signature(s) Signed: 11/06/2020 5:58:22 PM By: Baruch Gouty RN, BSN Signed: 11/09/2020 1:12:05 PM By: Sandre Kitty Entered By: Sandre Kitty on 11/06/2020 13:01:25 -------------------------------------------------------------------------------- Patient/Caregiver Education Details Patient Name: Date of Service: KIO RPES, [name]  11/12/2021andnbsp12:30  PM Medical Record Number: [id number] Patient Account Number: [id number] Date of Birth/Gender: Treating RN: Jul 06, 1928 (86 y.o. [name] Primary Care Physician: Dimas Chyle Other Clinician: Referring Physician: Treating Physician/Extender: Donalee Citrin in Treatment: 8 Education Assessment Education Provided To: Patient Education Topics Provided Pressure: Methods: Explain/Verbal Responses: Reinforcements needed, State content correctly Wound/Skin Impairment: Methods: Explain/Verbal Responses: Reinforcements needed, State content correctly Electronic Signature(s) Signed: 11/06/2020 5:58:22 PM By: Baruch Gouty RN, BSN Entered By: Baruch Gouty on 11/06/2020 13:48:34 -------------------------------------------------------------------------------- Wound Assessment Details Patient Name: Date of Service: Andrew [name], [name] 11/06/2020 12:30 PM Medical Record Number: [id number] Patient Account Number: [id number] Date of Birth/Sex: Treating RN: 12-24-1928 (86 y.o. [name] Primary Care Zoraida Havrilla: Dimas Chyle Other Clinician: Referring Rynn Markiewicz: Treating Burwell Bethel/Extender: Donalee Citrin in Treatment: 8 Wound Status Wound Number: 2 Primary Diabetic Wound/Ulcer of the Lower Extremity Etiology: Wound Location: Left Calcaneus Wound Open Wounding Event: Gradually Appeared Status: Date Acquired: 05/26/2020 Comorbid Congestive Heart Failure, Coronary Artery Disease, Hypertension, Weeks Of Treatment: 8 History: Peripheral Arterial Disease, Peripheral Venous Disease, Type II Clustered Wound: No Diabetes Wound Measurements Length: (cm) 1 Width: (cm) 2 Depth: (cm) 0.1 Area: (cm) 1.571 Volume: (cm) 0.157 % Reduction in Area: 76.5% % Reduction in Volume: 76.5% Epithelialization: Small (1-33%) Tunneling: No Undermining: No Wound Description Classification: Grade 2 Wound Margin: Flat and  Intact Exudate Amount: Medium Exudate Type: Serosanguineous Exudate Color: red, brown Foul Odor After Cleansing: No Slough/Fibrino Yes Wound Bed Granulation Amount: Large (67-100%) Exposed Structure Granulation Quality: Pink Fascia Exposed: No Necrotic Amount: Small (1-33%) Fat Layer (Subcutaneous Tissue) Exposed: Yes Necrotic Quality: Adherent Slough Tendon Exposed: No Muscle Exposed: No Joint Exposed: No Bone Exposed: No Treatment Notes Wound #2 (Left Calcaneus) 1. Cleanse With Wound Cleanser Soap and water 2. Periwound Care Moisturizing lotion TCA Cream 3. Primary Dressing Applied Iodoflex 4. Secondary Dressing Dry Gauze Heel Cup 6. Support Layer Applied Kerlix/Coban Notes netting. Electronic Signature(s) Signed: 11/06/2020 5:09:42 PM By: Deon Pilling Signed: 11/06/2020 5:58:22 PM By: Baruch Gouty RN, BSN Entered By: Deon Pilling on 11/06/2020 13:33:59 -------------------------------------------------------------------------------- Wound Assessment Details Patient Name: Date of Service: Andrew [name], [name] 11/06/2020 12:30 PM Medical Record Number: [id number] Patient Account Number: [id number] Date of Birth/Sex: Treating RN: 11-18-1928 (86 y.o. [name] Primary Care Eilis Chestnutt: Dimas Chyle Other Clinician: Referring Kwane Rohl: Treating Witney Huie/Extender: Donalee Citrin in Treatment: 8 Wound Status Wound Number: 4 Primary Venous Leg Ulcer Etiology: Wound Location: Right, Proximal, Anterior Lower Leg Wound Open Wounding Event: Gradually Appeared Status: Date Acquired: 10/09/2020 Comorbid Congestive Heart Failure, Coronary Artery Disease, Hypertension, Weeks Of Treatment: 4 History: Peripheral Arterial Disease, Peripheral Venous Disease, Type II Clustered Wound: No Diabetes Wound Measurements Length: (cm) 2.6 Width: (cm) 2.5 Depth: (cm) 0.1 Area: (cm) 5.105 Volume: (cm) 0.511 % Reduction in Area: -5330.9% %  Reduction in Volume: -5577.8% Epithelialization: Small (1-33%) Tunneling: No Undermining: No Wound Description Classification: Full Thickness Without Exposed Support Structures Wound Margin: Flat and Intact Exudate Amount: Medium Exudate Type: Serosanguineous Exudate Color: red, brown Foul Odor After Cleansing: No Slough/Fibrino Yes Wound Bed Granulation Amount: Medium (34-66%) Exposed Structure Granulation Quality: Red Fascia Exposed: No Necrotic Amount: Medium (34-66%) Fat Layer (Subcutaneous Tissue) Exposed: Yes Necrotic Quality: Adherent Slough Tendon Exposed: No Muscle Exposed: No Joint Exposed: No Bone Exposed: No Treatment Notes Wound #4 (Right, Proximal, Anterior Lower Leg) 1. Cleanse With Wound Cleanser Soap and water 2. Periwound Care Moisturizing lotion TCA Cream 3. Primary Dressing Applied  Iodoflex 4. Secondary Dressing Dry Gauze 6. Support Layer Applied Kerlix/Coban Notes netting. Electronic Signature(s) Signed: 11/06/2020 5:09:42 PM By: Deon Pilling Signed: 11/06/2020 5:58:22 PM By: Baruch Gouty RN, BSN Entered By: Deon Pilling on 11/06/2020 13:34:13 -------------------------------------------------------------------------------- Wound Assessment Details Patient Name: Date of Service: Andrew [name], [name] 11/06/2020 12:30 PM Medical Record Number: [id number] Patient Account Number: [id number] Date of Birth/Sex: Treating RN: 02-14-1928 (86 y.o. [name] Primary Care Matika Bartell: Dimas Chyle Other Clinician: Referring Kesi Perrow: Treating Leanndra Pember/Extender: Donalee Citrin in Treatment: 8 Wound Status Wound Number: 5 Primary Diabetic Wound/Ulcer of the Lower Extremity Etiology: Wound Location: Left, Medial Lower Leg Wound Open Wounding Event: Gradually Appeared Status: Date Acquired: 10/23/2020 Comorbid Congestive Heart Failure, Coronary Artery Disease, Hypertension, Weeks Of Treatment: 2 History: Peripheral  Arterial Disease, Peripheral Venous Disease, Type II Clustered Wound: No Diabetes Wound Measurements Length: (cm) 0.3 Width: (cm) 0.4 Depth: (cm) 0.1 Area: (cm) 0.094 Volume: (cm) 0.009 % Reduction in Area: 75.1% % Reduction in Volume: 76.3% Epithelialization: Small (1-33%) Tunneling: No Undermining: No Wound Description Classification: Grade 2 Wound Margin: Distinct, outline attached Exudate Amount: Medium Exudate Type: Serosanguineous Exudate Color: red, brown Foul Odor After Cleansing: No Slough/Fibrino Yes Wound Bed Granulation Amount: Large (67-100%) Exposed Structure Granulation Quality: Red Fascia Exposed: No Necrotic Amount: Small (1-33%) Fat Layer (Subcutaneous Tissue) Exposed: Yes Necrotic Quality: Adherent Slough Tendon Exposed: No Muscle Exposed: No Joint Exposed: No Bone Exposed: No Treatment Notes Wound #5 (Left, Medial Lower Leg) 1. Cleanse With Wound Cleanser Soap and water 2. Periwound Care Moisturizing lotion TCA Cream 3. Primary Dressing Applied Iodoflex 4. Secondary Dressing Dry Gauze 6. Support Layer Applied Kerlix/Coban Notes netting. Electronic Signature(s) Signed: 11/06/2020 5:09:42 PM By: Deon Pilling Signed: 11/06/2020 5:58:22 PM By: Baruch Gouty RN, BSN Entered By: Deon Pilling on 11/06/2020 13:34:37 -------------------------------------------------------------------------------- Vitals Details Patient Name: Date of Service: Andrew [name], [name] 11/06/2020 12:30 PM Medical Record Number: [id number] Patient Account Number: [id number] Date of Birth/Sex: Treating RN: 1928/01/06 (86 y.o. [name] Primary Care Shasta Chinn: Dimas Chyle Other Clinician: Referring Leoncio Hansen: Treating Tighe Gitto/Extender: Donalee Citrin in Treatment: 8 Vital Signs Time Taken: 13:01 Temperature (F): 98.5 Height (in): 67 Pulse (bpm): 63 Weight (lbs): 185 Respiratory Rate (breaths/min): 18 Body Mass Index (BMI):  29 Blood Pressure (mmHg): 113/56 Reference Range: 80 - 120 mg / dl Electronic Signature(s) Signed: 11/09/2020 1:12:05 PM By: Sandre Kitty Entered By: Sandre Kitty on 11/06/2020 13:01:15

## 2020-11-09 NOTE — Progress Notes (Signed)
DSHAUN, REPPUCCI ([id number]) Visit Report for 11/06/2020 Debridement Details Patient Name: Date of Service: Andrew [name], Andrew RGE A. 11/06/2020 12:30 PM Medical Record Number: [id number] Patient Account Number: [id number] Date of Birth/Sex: Treating RN: 1928-08-13 (86 y.o. [name] Primary Care Provider: Dimas Chyle Other Clinician: Referring Provider: Treating Provider/Extender: Donalee Citrin in Treatment: 8 Debridement Performed for Assessment: Wound #4 Right,Proximal,Anterior Lower Leg Performed By: Physician Ricard Dillon., MD Debridement Type: Debridement Severity of Tissue Pre Debridement: Fat layer exposed Level of Consciousness (Pre-procedure): Awake and Alert Pre-procedure Verification/Time Out Yes - 13:45 Taken: Start Time: 13:45 Pain Control: Other : benzocaine 20% spray T Area Debrided (L x W): otal 2.6 (cm) x 2.5 (cm) = 6.5 (cm) Tissue and other material debrided: Viable, Non-Viable, Slough, Subcutaneous, Slough Level: Skin/Subcutaneous Tissue Debridement Description: Excisional Instrument: Curette Bleeding: Minimum Hemostasis Achieved: Pressure End Time: 13:49 Procedural Pain: 6 Post Procedural Pain: 2 Response to Treatment: Procedure was tolerated well Level of Consciousness (Post- Awake and Alert procedure): Post Debridement Measurements of Total Wound Length: (cm) 2.6 Width: (cm) 2.5 Depth: (cm) 0.1 Volume: (cm) 0.511 Character of Wound/Ulcer Post Debridement: Improved Severity of Tissue Post Debridement: Fat layer exposed Post Procedure Diagnosis Same as Pre-procedure Electronic Signature(s) Signed: 11/06/2020 5:58:22 PM By: Baruch Gouty RN, BSN Signed: 11/09/2020 9:07:52 AM By: Linton Ham MD Entered By: Baruch Gouty on 11/06/2020 13:52:10 -------------------------------------------------------------------------------- Debridement Details Patient Name: Date of Service: Andrew [name], Andrew RGE A. 11/06/2020  12:30 PM Medical Record Number: [id number] Patient Account Number: [id number] Date of Birth/Sex: Treating RN: 1928/01/27 (86 y.o. [name] Primary Care Provider: Dimas Chyle Other Clinician: Referring Provider: Treating Provider/Extender: Donalee Citrin in Treatment: 8 Debridement Performed for Assessment: Wound #2 Left Calcaneus Performed By: Physician Ricard Dillon., MD Debridement Type: Debridement Severity of Tissue Pre Debridement: Fat layer exposed Level of Consciousness (Pre-procedure): Awake and Alert Pre-procedure Verification/Time Out Yes - 13:45 Taken: Start Time: 13:45 Pain Control: Other : benzocaine 20% spray T Area Debrided (L x W): otal 1 (cm) x 2 (cm) = 2 (cm) Tissue and other material debrided: Viable, Non-Viable, Slough, Subcutaneous, Slough Level: Skin/Subcutaneous Tissue Debridement Description: Excisional Instrument: Curette Bleeding: Minimum Hemostasis Achieved: Pressure End Time: 13:49 Procedural Pain: 0 Post Procedural Pain: 0 Response to Treatment: Procedure was tolerated well Level of Consciousness (Post- Awake and Alert procedure): Post Debridement Measurements of Total Wound Length: (cm) 1 Width: (cm) 2 Depth: (cm) 0.1 Volume: (cm) 0.157 Character of Wound/Ulcer Post Debridement: Improved Severity of Tissue Post Debridement: Fat layer exposed Post Procedure Diagnosis Same as Pre-procedure Electronic Signature(s) Signed: 11/06/2020 5:58:22 PM By: Baruch Gouty RN, BSN Signed: 11/09/2020 9:07:52 AM By: Linton Ham MD Entered By: Linton Ham on 11/06/2020 14:00:23 -------------------------------------------------------------------------------- HPI Details Patient Name: Date of Service: Andrew [name], Andrew RGE A. 11/06/2020 12:30 PM Medical Record Number: [id number] Patient Account Number: [id number] Date of Birth/Sex: Treating RN: 1928/06/13 (86 y.o. [name] Primary Care Provider:  Dimas Chyle Other Clinician: Referring Provider: Treating Provider/Extender: Donalee Citrin in Treatment: 8 History of Present Illness HPI Description: 09/11/2020 ADMISSION This is a soon-to-be 86 year old man who was hospitalized in June and subsequently spent time at [place] [facility]. At some point he developed a pressure ulcer on his left heel. On discharge he was seen by podiatry and referred to Dr. Donzetta Matters with regards to the possibility of PAD. The patient underwent angiography on 08/10/2020 this showed heavily calcified vessels. The common iliac artery on  the left appeared to have calcific disease but there was no gradient. Left common external leg arteries were heavily tortuous left lower extremity limited angiography developed demonstrated a SFA to be helped heavily calcified throughout with multiple greater than 50% stenosis but never frankly occludes he was not felt to be a candidate for endovascular repair. I think they had a minimal look at the right side and it appeared that the SFA was occluded and reconstitutes above the knee. The overall feeling was that if he could not heal his left heel ulcer he will eventually best be served by a left above-knee amputation The last I can see is that he should have been using a Santyl wet-to-dry dressing to the heel. During his stay with podiatry was also noted that he had venous insufficiency and I think venous insufficiency wounds. His wife tells me that he has been recommended for compression stockings in the past but the patient refused, he has wounds on his bilateral anterior lower legs right greater than the left. Chronic venous inflammation and dry flaking skin. Past medical history; heart failure, right heel wound, paroxysmal atrial fibrillation on Eliquis, abdominal aortic aneurysm with previous repair, COPD, DVT, melanoma on his back, diabetes with a recent hemoglobin A1c of 7.3 however he is not on any  treatment [type II], PAD, coronary artery disease, hypertension The patient's most recent arterial studies were on 05/28/2020. This showed an ABI on the right at 1.12 but monophasic waveforms and absent waveforms at the posterior tibial artery. On the left his ABI was 1.47 [noncompressible] he had triphasic waveforms at the ATA and monophasic at the PTA. His TBI on the right was 1.12 on the left and 0.97. It was felt his ABIs were falsely elevated 9/24. The patient followed up with Dr. Donzetta Matters and had noninvasive arterial studies. On the right his ABIs were noncompressible monophasic waveforms with a TBI of 1.45 and a right great toe pressure of 172. On the left he had monophasic waveforms with noncompressible ABIs TBI of 0.79 and a left great toe pressure of 94. He will be seen in follow-up by Dr. Donzetta Matters in a month's time. The patient also has very significant chronic venous insufficiency 10/8; patient with known PAD. He came in with bilateral lower extremity wounds likely secondary to venous stasis. Still has one on the upper right tibial area. The area on the left leg is healed however he still has an area on the left heel. This needed an extensive debridement today. His wife brings in notes apparently Dr. March Rummage ordered an MRI of the left heel. I was unaware of this. I was not aware that he did not been seen by Dr. March Rummage and I do not have any records of this. Apparently MRI called to set up the appointment. I will need to look through epic on this 10/15; this is a patient with known PAD who was revascularized. He also has venous stasis disease. The area that was on the right anterior tibia has actually close down although he has a still a small area on the upper anterior tibia. 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 We have been  using Iodoflex on this area and he is required debridements. We tried to get her to agree to an MRI that have been ordered by  Dr. March Rummage. However she did not get back to the MRI people or they have not called her back. We will reorder this. He is quite tender around this wound and I wonder about osteomyelitis. 10/29; MRI. Fortunately this did not show any evidence of abscess or osteomyelitis in the left heel. Vein and vascular on 10/21; this is a patient who is previously had an angiogram. He had a heavily calcified aorta and iliac vessels as well as the SFA. He had multiple areas of diffuse 50% stenosis. No interventions were apparently done by Dr. Donzetta Matters he does have an occluded right SFA but with above-knee reconstitution. He was not felt to be a candidate for revascularization for his diffuse areas of greater than 50% stenosis. It was not felt that these were amenable to intervention Since he was last here he went to see dermatology. I believe he had skin cancer extractions from the right area anteriorly and 2 on the left medial. 11/12; left heel actually looks somewhat better but still requiring debridement. We've been using Iodoflex Right anterior mid tibia surgical extraction for a skin cancer. This also required debridement The area on the left lower medial leg looks a lot better and is just about fully epithelialized Electronic Signature(s) Signed: 11/09/2020 9:07:52 AM By: Linton Ham MD Entered By: Linton Ham on 11/06/2020 14:01:08 -------------------------------------------------------------------------------- Physical Exam Details Patient Name: Date of Service: Andrew [name], Andrew RGE A. 11/06/2020 12:30 PM Medical Record Number: [id number] Patient Account Number: [id number] Date of Birth/Sex: Treating RN: 11-19-1928 (86 y.o. [name] Primary Care Provider: Dimas Chyle Other Clinician: Referring Provider: Treating Provider/Extender: Donalee Citrin in Treatment: 8 Constitutional Sitting or standing Blood Pressure is within target range for patient.. Pulse regular and within target range for patient.Marland Kitchen Respirations regular, non-labored and within target range.. Temperature is normal and within the target range for the patient.Marland Kitchen Appears in no distress. Notes Wound exam; The heel had some adherent debris that I debrided with a #5 curette this cleans up quite nicely hopefully this will progress further epithelialization. Hemostasis with direct pressure Right anterior lower leg again adherent debris. The left lower leg is almost fully epithelialized and is on his way to healing Electronic Signature(s) Signed: 11/09/2020 9:07:52 AM By: Linton Ham MD Entered By: Linton Ham on 11/06/2020 14:02:09 -------------------------------------------------------------------------------- Physician Orders Details Patient Name: Date of Service: Andrew [name], Andrew RGE A. 11/06/2020 12:30 PM Medical Record Number: [id number] Patient Account Number: [id number] Date of Birth/Sex: Treating RN: December 27, 1927 (86 y.o. [name] Primary Care Provider: Dimas Chyle Other Clinician: Referring Provider: Treating Provider/Extender: Donalee Citrin in Treatment: 8 Verbal / Phone Orders: No Diagnosis Coding ICD-10 Coding Code Description E11.621 Type 2 diabetes mellitus with foot ulcer L97.422 Non-pressure chronic ulcer of left heel and midfoot with fat layer exposed I87.333 Chronic venous hypertension (idiopathic) with ulcer and inflammation of bilateral lower extremity L97.811 Non-pressure chronic ulcer of other part of right lower leg limited to breakdown of skin L97.818 Non-pressure chronic ulcer of other part of right lower leg with other specified severity L97.829 Non-pressure chronic ulcer of other part of left lower leg with unspecified severity T81.31XA Disruption of external operation (surgical) wound, not  elsewhere classified, initial encounter Follow-up Appointments Return appointment in 3 weeks. Dressing Change Frequency Other: - HH to change twice a week on weeks that patient is not coming to wound care clinic Skin Barriers/Peri-Wound Care Moisturizing lotion - mixed with TCA cream to legs TCA Cream or Ointment  Wound Cleansing May shower with protection. - cast protector Primary Wound Dressing Wound #2 Left Calcaneus Iodoflex Wound #4 Right,Proximal,Anterior Lower Leg Iodoflex Wound #5 Left,Medial Lower Leg Iodoflex Secondary Dressing Wound #2 Left Calcaneus Dry Gauze Heel Cup - on left heel Wound #4 Right,Proximal,Anterior Lower Leg Dry Gauze Wound #5 Left,Medial Lower Leg Dry Gauze Edema Control Kerlix and Coban - Bilateral - wrap from base of toes to tibial tuberosity Avoid standing for long periods of time Elevate legs to the level of the heart or above for 30 minutes daily and/or when sitting, a frequency of: Exercise regularly [facility] for wound care. - [facility] Electronic Signature(s) Signed: 11/06/2020 5:58:22 PM By: Baruch Gouty RN, BSN Signed: 11/09/2020 9:07:52 AM By: Linton Ham MD Entered By: Baruch Gouty on 11/06/2020 13:53:12 -------------------------------------------------------------------------------- Problem List Details Patient Name: Date of Service: Andrew [name], Andrew RGE A. 11/06/2020 12:30 PM Medical Record Number: [id number] Patient Account Number: [id number] Date of Birth/Sex: Treating RN: September 14, 1928 (86 y.o. [name] Primary Care Provider: Dimas Chyle Other Clinician: Referring Provider: Treating Provider/Extender: Donalee Citrin in Treatment: 8 Active Problems ICD-10 Encounter Code Description Active Date MDM Diagnosis E11.621 Type 2 diabetes mellitus with foot ulcer 09/11/2020 No Yes L97.422 Non-pressure chronic ulcer of left heel and midfoot with fat  layer exposed 09/11/2020 No Yes I87.333 Chronic venous hypertension (idiopathic) with ulcer and inflammation of 09/11/2020 No Yes bilateral lower extremity L97.811 Non-pressure chronic ulcer of other part of right lower leg limited to breakdown 09/11/2020 No Yes of skin L97.818 Non-pressure chronic ulcer of other part of right lower leg with other specified 10/23/2020 No Yes severity L97.829 Non-pressure chronic ulcer of other part of left lower leg with unspecified 10/23/2020 No Yes severity T81.31XA Disruption of external operation (surgical) wound, not elsewhere classified, 10/23/2020 No Yes initial encounter Inactive Problems ICD-10 Code Description Active Date Inactive Date L97.821 Non-pressure chronic ulcer of other part of left lower leg limited to breakdown of skin 09/11/2020 09/11/2020 Resolved Problems Electronic Signature(s) Signed: 11/09/2020 9:07:52 AM By: Linton Ham MD Entered By: Linton Ham on 11/06/2020 13:58:15 -------------------------------------------------------------------------------- Progress Note Details Patient Name: Date of Service: Andrew [name], Andrew RGE A. 11/06/2020 12:30 PM Medical Record Number: [id number] Patient Account Number: [id number] Date of Birth/Sex: Treating RN: 1928-06-25 (86 y.o. [name] Primary Care Provider: Dimas Chyle Other Clinician: Referring Provider: Treating Provider/Extender: Donalee Citrin in Treatment: 8 Subjective History of Present Illness (HPI) 09/11/2020 ADMISSION This is a soon-to-be 86 year old man who was hospitalized in June and subsequently spent time at [facility] [facility]. At some point he developed a pressure ulcer on his left heel. On discharge he was seen by podiatry and referred to Dr. Donzetta Matters with regards to the possibility of PAD. The patient underwent angiography on 08/10/2020 this showed heavily calcified vessels. The common iliac artery on the left appeared to have  calcific disease but there was no gradient. Left common external leg arteries were heavily tortuous left lower extremity limited angiography developed demonstrated a SFA to be helped heavily calcified throughout with multiple greater than 50% stenosis but never frankly occludes he was not felt to be a candidate for endovascular repair. I think they had a minimal look at the right side and it appeared that the SFA was occluded and reconstitutes above the knee. The overall feeling was that if he could not heal his left heel ulcer he will eventually best be served by a left above-knee amputation The  last I can see is that he should have been using a Santyl wet-to-dry dressing to the heel. During his stay with podiatry was also noted that he had venous insufficiency and I think venous insufficiency wounds. His wife tells me that he has been recommended for compression stockings in the past but the patient refused, he has wounds on his bilateral anterior lower legs right greater than the left. Chronic venous inflammation and dry flaking skin. Past medical history; heart failure, right heel wound, paroxysmal atrial fibrillation on Eliquis, abdominal aortic aneurysm with previous repair, COPD, DVT, melanoma on his back, diabetes with a recent hemoglobin A1c of 7.3 however he is not on any treatment [type II], PAD, coronary artery disease, hypertension The patient's most recent arterial studies were on 05/28/2020. This showed an ABI on the right at 1.12 but monophasic waveforms and absent waveforms at the posterior tibial artery. On the left his ABI was 1.47 [noncompressible] he had triphasic waveforms at the ATA and monophasic at the PTA. His TBI on the right was 1.12 on the left and 0.97. It was felt his ABIs were falsely elevated 9/24. The patient followed up with Dr. Donzetta Matters and had noninvasive arterial studies. On the right his ABIs were noncompressible monophasic waveforms with a TBI of 1.45 and a right great  toe pressure of 172. On the left he had monophasic waveforms with noncompressible ABIs TBI of 0.79 and a left great toe pressure of 94. He will be seen in follow-up by Dr. Donzetta Matters in a month's time. The patient also has very significant chronic venous insufficiency 10/8; patient with known PAD. He came in with bilateral lower extremity wounds likely secondary to venous stasis. Still has one on the upper right tibial area. The area on the left leg is healed however he still has an area on the left heel. This needed an extensive debridement today. His wife brings in notes apparently Dr. March Rummage ordered an MRI of the left heel. I was unaware of this. I was not aware that he did not been seen by Dr. March Rummage and I do not have any records of this. Apparently MRI called to set up the appointment. I will need to look through epic on this 10/15; this is a patient with known PAD who was revascularized. He also has venous stasis disease. The area that was on the right anterior tibia has actually close down although he has a still a small area on the upper anterior tibia. 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 We have been using Iodoflex on this area and he is required debridements. We tried to get her to agree to an MRI that have been ordered by Dr. March Rummage. However she did not get back to the MRI people or they have not called her back. We will reorder this. He is quite tender around this wound and I wonder about osteomyelitis. 10/29; MRI. Fortunately this did not show any evidence of abscess or osteomyelitis in the left heel. Vein and vascular on 10/21; this is a patient who is previously had an angiogram. He had a heavily calcified aorta and iliac vessels as well as the SFA. He had multiple areas of diffuse 50% stenosis. No interventions  were apparently done by Dr. Donzetta Matters he does have an occluded right SFA but with  above-knee reconstitution. He was not felt to be a candidate for revascularization for his diffuse areas of greater than 50% stenosis. It was not felt that these were amenable to intervention Since he was last here he went to see dermatology. I believe he had skin cancer extractions from the right area anteriorly and 2 on the left medial. 11/12; left heel actually looks somewhat better but still requiring debridement. We've been using Iodoflex ooRight anterior mid tibia surgical extraction for a skin cancer. This also required debridement ooThe area on the left lower medial leg looks a lot better and is just about fully epithelialized Objective Constitutional Sitting or standing Blood Pressure is within target range for patient.. Pulse regular and within target range for patient.Marland Kitchen Respirations regular, non-labored and within target range.. Temperature is normal and within the target range for the patient.Marland Kitchen Appears in no distress. Vitals Time Taken: 1:01 PM, Height: 67 in, Weight: 185 lbs, BMI: 29, Temperature: 98.5 F, Pulse: 63 bpm, Respiratory Rate: 18 breaths/min, Blood Pressure: 113/56 mmHg. General Notes: Wound exam; ooThe heel had some adherent debris that I debrided with a #5 curette this cleans up quite nicely hopefully this will progress further epithelialization. Hemostasis with direct pressure ooRight anterior lower leg again adherent debris. ooThe left lower leg is almost fully epithelialized and is on his way to healing Integumentary (Hair, Skin) Wound #2 status is Open. Original cause of wound was Gradually Appeared. The wound is located on the Left Calcaneus. The wound measures 1cm length x 2cm width x 0.1cm depth; 1.571cm^2 area and 0.157cm^3 volume. There is Fat Layer (Subcutaneous Tissue) exposed. There is no tunneling or undermining noted. There is a medium amount of serosanguineous drainage  noted. The wound margin is flat and intact. There is large (67-100%) pink granulation within the wound bed. There is a small (1-33%) amount of necrotic tissue within the wound bed including Adherent Slough. Wound #4 status is Open. Original cause of wound was Gradually Appeared. The wound is located on the Right,Proximal,Anterior Lower Leg. The wound measures 2.6cm length x 2.5cm width x 0.1cm depth; 5.105cm^2 area and 0.511cm^3 volume. There is Fat Layer (Subcutaneous Tissue) exposed. There is no tunneling or undermining noted. There is a medium amount of serosanguineous drainage noted. The wound margin is flat and intact. There is medium (34-66%) red granulation within the wound bed. There is a medium (34-66%) amount of necrotic tissue within the wound bed including Adherent Slough. Wound #5 status is Open. Original cause of wound was Gradually Appeared. The wound is located on the Left,Medial Lower Leg. The wound measures 0.3cm length x 0.4cm width x 0.1cm depth; 0.094cm^2 area and 0.009cm^3 volume. There is Fat Layer (Subcutaneous Tissue) exposed. There is no tunneling or undermining noted. There is a medium amount of serosanguineous drainage noted. The wound margin is distinct with the outline attached to the wound base. There is large (67-100%) red granulation within the wound bed. There is a small (1-33%) amount of necrotic tissue within the wound bed including Adherent Slough. Assessment Active Problems ICD-10 Type 2 diabetes mellitus with foot ulcer Non-pressure chronic ulcer of left heel and midfoot with fat layer exposed Chronic venous hypertension (idiopathic) with ulcer and inflammation of bilateral lower extremity Non-pressure chronic ulcer of other part of right lower leg limited to breakdown of skin Non-pressure chronic ulcer of other part of right lower leg with other specified severity Non-pressure chronic ulcer of other part of left lower leg with unspecified  severity Disruption of external operation (  surgical) wound, not elsewhere classified, initial encounter Procedures Wound #2 Pre-procedure diagnosis of Wound #2 is a Diabetic Wound/Ulcer of the Lower Extremity located on the Left Calcaneus .Severity of Tissue Pre Debridement is: Fat layer exposed. There was a Excisional Skin/Subcutaneous Tissue Debridement with a total area of 2 sq cm performed by Ricard Dillon., MD. With the following instrument(s): Curette to remove Viable and Non-Viable tissue/material. Material removed includes Subcutaneous Tissue and Slough and after achieving pain control using Other (benzocaine 20% spray). No specimens were taken. A time out was conducted at 13:45, prior to the start of the procedure. A Minimum amount of bleeding was controlled with Pressure. The procedure was tolerated well with a pain level of 0 throughout and a pain level of 0 following the procedure. Post Debridement Measurements: 1cm length x 2cm width x 0.1cm depth; 0.157cm^3 volume. Character of Wound/Ulcer Post Debridement is improved. Severity of Tissue Post Debridement is: Fat layer exposed. Post procedure Diagnosis Wound #2: Same as Pre-Procedure Wound #4 Pre-procedure diagnosis of Wound #4 is a Venous Leg Ulcer located on the Right,Proximal,Anterior Lower Leg .Severity of Tissue Pre Debridement is: Fat layer exposed. There was a Excisional Skin/Subcutaneous Tissue Debridement with a total area of 6.5 sq cm performed by Ricard Dillon., MD. With the following instrument(s): Curette to remove Viable and Non-Viable tissue/material. Material removed includes Subcutaneous Tissue and Slough and after achieving pain control using Other (benzocaine 20% spray). No specimens were taken. A time out was conducted at 13:45, prior to the start of the procedure. A Minimum amount of bleeding was controlled with Pressure. The procedure was tolerated well with a pain level of 6 throughout and a pain level of  2 following the procedure. Post Debridement Measurements: 2.6cm length x 2.5cm width x 0.1cm depth; 0.511cm^3 volume. Character of Wound/Ulcer Post Debridement is improved. Severity of Tissue Post Debridement is: Fat layer exposed. Post procedure Diagnosis Wound #4: Same as Pre-Procedure Plan Follow-up Appointments: Return appointment in 3 weeks. Dressing Change Frequency: Other: - HH to change twice a week on weeks that patient is not coming to wound care clinic Skin Barriers/Peri-Wound Care: Moisturizing lotion - mixed with TCA cream to legs TCA Cream or Ointment Wound Cleansing: May shower with protection. - cast protector Primary Wound Dressing: Wound #2 Left Calcaneus: Iodoflex Wound #4 Right,Proximal,Anterior Lower Leg: Iodoflex Wound #5 Left,Medial Lower Leg: Iodoflex Secondary Dressing: Wound #2 Left Calcaneus: Dry Gauze Heel Cup - on left heel Wound #4 Right,Proximal,Anterior Lower Leg: Dry Gauze Wound #5 Left,Medial Lower Leg: Dry Gauze Edema Control: Kerlix and Coban - Bilateral - wrap from base of toes to tibial tuberosity Avoid standing for long periods of time Elevate legs to the level of the heart or above for 30 minutes daily and/or when sitting, a frequency of: Exercise regularly Home Health: [facility] for wound care. - [facility] 1. I continued with Iodoflex to all wound areas 2. Kerlix and Coban bilaterally he has severe stasis dermatitis 3. He has home health we will put him out 3 weeks and see him back the week after Thanksgiving Electronic Signature(s) Signed: 11/09/2020 9:07:52 AM By: Linton Ham MD Entered By: Linton Ham on 11/06/2020 14:02:53 -------------------------------------------------------------------------------- SuperBill Details Patient Name: Date of Service: Andrew [name], Andrew RGE A. 11/06/2020 Medical Record Number: [id number] Patient Account Number: [id number] Date of Birth/Sex: Treating RN: Feb 14, 1928  (86 y.o. [name] Primary Care Provider: Dimas Chyle Other Clinician: Referring Provider: Treating Provider/Extender: Donalee Citrin in  Treatment: 8 Diagnosis Coding ICD-10 Codes Code Description E11.621 Type 2 diabetes mellitus with foot ulcer L97.422 Non-pressure chronic ulcer of left heel and midfoot with fat layer exposed I87.333 Chronic venous hypertension (idiopathic) with ulcer and inflammation of bilateral lower extremity L97.811 Non-pressure chronic ulcer of other part of right lower leg limited to breakdown of skin L97.818 Non-pressure chronic ulcer of other part of right lower leg with other specified severity L97.829 Non-pressure chronic ulcer of other part of left lower leg with unspecified severity T81.31XA Disruption of external operation (surgical) wound, not elsewhere classified, initial encounter Facility Procedures CPT4 Code: [id number] Description: [facility] - DEB SUBQ TISSUE 20 SQ CM/< ICD-10 Diagnosis Description L97.422 Non-pressure chronic ulcer of left heel and midfoot with fat layer exposed L97.818 Non-pressure chronic ulcer of other part of right lower leg with other specified Modifier: severity Quantity: 1 Physician Procedures : CPT4 Code Description Modifier [id number] - WC PHYS SUBQ TISS 20 SQ CM ICD-10 Diagnosis Description L97.422 Non-pressure chronic ulcer of left heel and midfoot with fat layer exposed L97.818 Non-pressure chronic ulcer of other part of right lower  leg with other specified severity Quantity: 1 Electronic Signature(s) Signed: 11/09/2020 9:07:52 AM By: Linton Ham MD Entered By: Linton Ham on 11/06/2020 14:03:03

## 2020-11-11 DIAGNOSIS — L89620 Pressure ulcer of left heel, unstageable: Secondary | ICD-10-CM | POA: Diagnosis not present

## 2020-11-12 ENCOUNTER — Other Ambulatory Visit (HOSPITAL_COMMUNITY): Payer: Self-pay | Admitting: *Deleted

## 2020-11-12 ENCOUNTER — Ambulatory Visit: Payer: Medicare PPO

## 2020-11-12 ENCOUNTER — Other Ambulatory Visit (HOSPITAL_COMMUNITY): Payer: Self-pay

## 2020-11-12 MED ORDER — AMIODARONE HCL 200 MG PO TABS
100.0000 mg | ORAL_TABLET | Freq: Every day | ORAL | 3 refills | Status: DC
Start: 2020-11-12 — End: 2021-04-19

## 2020-11-12 MED ORDER — EZETIMIBE 10 MG PO TABS
ORAL_TABLET | ORAL | 0 refills | Status: DC
Start: 2020-11-12 — End: 2021-03-17

## 2020-11-12 NOTE — Progress Notes (Signed)
Paramedicine Encounter    Patient ID: PARKER WHERLEY, male    DOB: 1928-08-01, 86 y.o.   MRN: [id number]   Patient Care Team: [name], MD as PCP - General (Family Medicine) Larey Dresser, MD as PCP - Advanced Heart Failure (Cardiology) Larey Dresser, MD as Consulting Physician (Cardiology) Jorge Ny, LCSW as Social Worker (Licensed Clinical Social Worker) Gardiner Barefoot, DPM as Consulting Physician (Podiatry) [name], MD (Family Medicine) Patient, No Pcp Per (General Practice)  Patient Active Problem List   Diagnosis Date Noted  . Acute respiratory failure with hypercapnia ([place]) 06/02/2020  . Acute metabolic encephalopathy 08/67/6195  . CKD (chronic kidney disease), stage IV ([place]) 06/02/2020  . PAF (paroxysmal atrial fibrillation) ([place]) 06/02/2020  . Hypothyroidism 06/02/2020  . Chronic anticoagulation 06/02/2020  . Debility 06/01/2020  . Lumbar spondylosis 10/04/2018  . Chronic respiratory failure with hypoxia ([place]) 09/16/2018  . Pain of right thumb 06/19/2018  . Bilateral recurrent inguinal hernias 11/28/2017  . Carotid artery stenosis 01/12/2016  . Chronic systolic CHF (congestive heart failure) ([place]) 12/02/2015  . 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 ([place]) 10/16/2015  . PVD (peripheral vascular disease) ([place])   . Hereditary and idiopathic peripheral neuropathy 10/02/2015  . Peripelvic (lymphatic) cyst   . Pernicious anemia 07/15/2015  . Bilateral renal cysts 07/12/2015  . Lung nodule, 33mm RML CT 07/12/15 07/12/2015  . Stage 3 chronic kidney disease ([place]) 06/14/2015  . Paroxysmal atrial fibrillation ([place]) 05/28/2015  . AAA (abdominal aortic aneurysm) without rupture ([place]) 09/30/2014  . Cardiomyopathy, ischemic 05/23/2014  . DM (diabetes mellitus), type 2 with renal complications ([place]) 09/32/6712  . Depression, major, single episode, complete remission ([place]) 03/18/2010  . Hypothyroidism 02/03/2009  . Osteoarthritis 05/27/2008  .  Hyperlipidemia associated with type 2 diabetes mellitus ([place]) 06/14/2007  . Hypertension associated with diabetes ([place]) 06/14/2007  . GERD 06/14/2007    Current Outpatient Medications:  .  acetaminophen (TYLENOL) 500 MG tablet, Take 2 tablets (1,000 mg total) by mouth every 8 (eight) hours as needed., Disp: 30 tablet, Rfl: 0 .  amiodarone (PACERONE) 200 MG tablet, Take 100 mg by mouth daily., Disp: , Rfl:  .  apixaban (ELIQUIS) 2.5 MG TABS tablet, Take 2.5 mg by mouth 2 (two) times daily. , Disp: , Rfl:  .  atorvastatin (LIPITOR) 80 MG tablet, Take 80 mg by mouth daily., Disp: , Rfl:  .  carvedilol (COREG) 12.5 MG tablet, Take 12.5 mg by mouth 2 (two) times daily with a meal., Disp: , Rfl:  .  cilostazol (PLETAL) 100 MG tablet, Take 1 tablet (100 mg total) by mouth 2 (two) times daily., Disp: 180 tablet, Rfl: 3 .  ezetimibe (ZETIA) 10 MG tablet, TAKE (1) TABLET DAILY., Disp: 90 tablet, Rfl: 0 .  FLUoxetine (PROZAC) 20 MG capsule, TAKE (1) CAPSULE DAILY., Disp: 90 capsule, Rfl: 0 .  gabapentin (NEURONTIN) 100 MG capsule, Take 100 mg by mouth 3 (three) times daily., Disp: , Rfl:  .  hydrALAZINE (APRESOLINE) 50 MG tablet, Take 50 mg by mouth 3 (three) times daily., Disp: , Rfl:  .  isosorbide mononitrate (IMDUR) 30 MG 24 hr tablet, Take 30 mg by mouth daily., Disp: , Rfl:  .  levothyroxine (SYNTHROID) 100 MCG tablet, TAKE 1 TABLET BEFORE BREAKFAST., Disp: 90 tablet, Rfl: 0 .  Multiple Vitamins-Minerals (MULTIVITAMIN ADULTS 50+) TABS, Take 1 tablet by mouth daily., Disp: , Rfl:  .  pantoprazole (PROTONIX) 40 MG tablet, Take 1 tablet (40 mg  total) by mouth 2 (two) times daily., Disp: , Rfl:  .  potassium chloride SA (KLOR-CON) 20 MEQ tablet, Take 20 mEq by mouth daily., Disp: , Rfl:  .  tiotropium (SPIRIVA) 18 MCG inhalation capsule, Place 18 mcg into inhaler and inhale daily as needed (for wheezing)., Disp: , Rfl:  .  torsemide (DEMADEX) 20 MG tablet, Take 4 tablets (80 mg total) by mouth 2 (two)  times daily., Disp: 240 tablet, Rfl: 3 .  glucose blood (ACCU-CHEK AVIVA PLUS) test strip, CHECK BLOOD SUGAR 4 TIMES A DAY., Disp: 100 each, Rfl: 11 .  HYDROcodone-acetaminophen (NORCO/VICODIN) 5-325 MG tablet, TAKE 1 TABLET EVERY 6 HOURS AS NEEDED FOR MODERATE PAIN. (Patient not taking: Reported on 10/22/2020), Disp: 12 tablet, Rfl: 0 .  metolazone (ZAROXOLYN) 2.5 MG tablet, Take 1 tablet (2.5 mg total) by mouth as directed. By HF Clinic (Patient not taking: Reported on 10/22/2020), Disp: 3 tablet, Rfl: 0 .  nitroGLYCERIN (NITROSTAT) 0.4 MG SL tablet, DISSOLVE 1 TABLET UNDER TONGUE AS NEEDED FOR CHEST PAIN,MAY REPEAT IN5 MINUTES FOR 2 DOSES. (Patient not taking: Reported on 10/22/2020), Disp: 25 tablet, Rfl: 3 Allergies  Allergen Reactions  . Metoprolol Shortness Of Breath, Palpitations and Other (See Comments)    Heart starts racing. Shallow breathing; pt states he also get skin irritation on his legs  . Metformin Hives and Swelling    On legs  . Metformin And Related Nausea And Vomiting      Social History   Socioeconomic History  . Marital status: Married    Spouse name: Not on file  . Number of children: Not on file  . Years of education: Not on file  . Highest education level: Not on file  Occupational History  . Occupation: Retired  Tobacco Use  . Smoking status: Never Smoker  . Smokeless tobacco: Never Used  . Tobacco comment: quit atleast 25 yrs ago, per pt  Vaping Use  . Vaping Use: Never used  Substance and Sexual Activity  . Alcohol use: Never    Comment: socially  . Drug use: Never  . Sexual activity: Not Currently  Other Topics Concern  . Not on file  Social History Narrative   ** Merged History Encounter **       Pt lives in [place] with spouse. Retired from [employer].  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   Social Determinants of Health   Financial Resource Strain:   . Difficulty of Paying Living  Expenses: Not on file  Food Insecurity:   . Worried About Charity fundraiser in the Last Year: Not on file  . Ran Out of Food in the Last Year: Not on file  Transportation Needs:   . Lack of Transportation (Medical): Not on file  . Lack of Transportation (Non-Medical): Not on file  Physical Activity:   . Days of Exercise per Week: Not on file  . Minutes of Exercise per Session: Not on file  Stress:   . Feeling of Stress : Not on file  Social Connections:   . Frequency of Communication with Friends and Family: Not on file  . Frequency of Social Gatherings with Friends and Family: Not on file  . Attends Religious Services: Not on file  . Active Member of Clubs or Organizations: Not on file  . Attends Archivist Meetings: Not on file  . Marital Status: Not on file  Intimate Partner Violence:   . Fear of Current or  Ex-Partner: Not on file  . Emotionally Abused: Not on file  . Physically Abused: Not on file  . Sexually Abused: Not on file    Physical Exam Cardiovascular:     Rate and Rhythm: Normal rate and regular rhythm.     Pulses: Normal pulses.  Pulmonary:     Effort: Pulmonary effort is normal.     Breath sounds: Normal breath sounds.  Musculoskeletal:        General: Normal range of motion.     Right lower leg: No edema.     Left lower leg: No edema.  Skin:    General: Skin is warm and dry.     Capillary Refill: Capillary refill takes less than 2 seconds.  Neurological:     Mental Status: He is alert and oriented to person, place, and time.  Psychiatric:        Mood and Affect: Mood normal.         Future Appointments  Date Time Provider [place]  11/27/2020  1:30 PM Ricard Dillon, MD [facility]  01/14/2021  1:40 PM Larey Dresser, MD [facility] None    BP 110/60 (BP Location: Right Arm, Patient Position: Sitting, Cuff Size: Normal)   Pulse 68   Resp 16   Wt 168 lb (76.2 kg)   SpO2 95%   BMI 26.31 kg/m   Weight yesterday-  did not weigh  Last visit weight- 176 lb  Mr Shrum was seen at home today and reported feeling well. He denied chest pain, SOB, headache, dizziness, orthopnea, fever or cough over the past two weeks. He had missed several doses of medications over the past week however his weight has trended down. I stressed the importance of medications compliance and he was agreeable. His medications were verified and his pillbox was refilled. I will follow up in two weeks.   [name], EMT 11/12/20  ACTION: Home visit completed Next visit planned for 2 weeks

## 2020-11-13 DIAGNOSIS — E1169 Type 2 diabetes mellitus with other specified complication: Secondary | ICD-10-CM | POA: Diagnosis not present

## 2020-11-13 DIAGNOSIS — L89620 Pressure ulcer of left heel, unstageable: Secondary | ICD-10-CM | POA: Diagnosis not present

## 2020-11-13 DIAGNOSIS — E1122 Type 2 diabetes mellitus with diabetic chronic kidney disease: Secondary | ICD-10-CM | POA: Diagnosis not present

## 2020-11-13 DIAGNOSIS — E1151 Type 2 diabetes mellitus with diabetic peripheral angiopathy without gangrene: Secondary | ICD-10-CM | POA: Diagnosis not present

## 2020-11-13 DIAGNOSIS — I130 Hypertensive heart and chronic kidney disease with heart failure and stage 1 through stage 4 chronic kidney disease, or unspecified chronic kidney disease: Secondary | ICD-10-CM | POA: Diagnosis not present

## 2020-11-13 DIAGNOSIS — I5022 Chronic systolic (congestive) heart failure: Secondary | ICD-10-CM | POA: Diagnosis not present

## 2020-11-13 DIAGNOSIS — D631 Anemia in chronic kidney disease: Secondary | ICD-10-CM | POA: Diagnosis not present

## 2020-11-13 DIAGNOSIS — N184 Chronic kidney disease, stage 4 (severe): Secondary | ICD-10-CM | POA: Diagnosis not present

## 2020-11-13 DIAGNOSIS — I872 Venous insufficiency (chronic) (peripheral): Secondary | ICD-10-CM | POA: Diagnosis not present

## 2020-11-16 DIAGNOSIS — J9602 Acute respiratory failure with hypercapnia: Secondary | ICD-10-CM | POA: Diagnosis not present

## 2020-11-16 DIAGNOSIS — I5022 Chronic systolic (congestive) heart failure: Secondary | ICD-10-CM | POA: Diagnosis not present

## 2020-11-16 DIAGNOSIS — J449 Chronic obstructive pulmonary disease, unspecified: Secondary | ICD-10-CM | POA: Diagnosis not present

## 2020-11-16 DIAGNOSIS — J9601 Acute respiratory failure with hypoxia: Secondary | ICD-10-CM | POA: Diagnosis not present

## 2020-11-18 DIAGNOSIS — N184 Chronic kidney disease, stage 4 (severe): Secondary | ICD-10-CM | POA: Diagnosis not present

## 2020-11-18 DIAGNOSIS — E1169 Type 2 diabetes mellitus with other specified complication: Secondary | ICD-10-CM | POA: Diagnosis not present

## 2020-11-18 DIAGNOSIS — I130 Hypertensive heart and chronic kidney disease with heart failure and stage 1 through stage 4 chronic kidney disease, or unspecified chronic kidney disease: Secondary | ICD-10-CM | POA: Diagnosis not present

## 2020-11-18 DIAGNOSIS — L89620 Pressure ulcer of left heel, unstageable: Secondary | ICD-10-CM | POA: Diagnosis not present

## 2020-11-18 DIAGNOSIS — E1151 Type 2 diabetes mellitus with diabetic peripheral angiopathy without gangrene: Secondary | ICD-10-CM | POA: Diagnosis not present

## 2020-11-18 DIAGNOSIS — E1122 Type 2 diabetes mellitus with diabetic chronic kidney disease: Secondary | ICD-10-CM | POA: Diagnosis not present

## 2020-11-18 DIAGNOSIS — I872 Venous insufficiency (chronic) (peripheral): Secondary | ICD-10-CM | POA: Diagnosis not present

## 2020-11-18 DIAGNOSIS — D631 Anemia in chronic kidney disease: Secondary | ICD-10-CM | POA: Diagnosis not present

## 2020-11-18 DIAGNOSIS — I5022 Chronic systolic (congestive) heart failure: Secondary | ICD-10-CM | POA: Diagnosis not present

## 2020-11-23 DIAGNOSIS — N184 Chronic kidney disease, stage 4 (severe): Secondary | ICD-10-CM | POA: Diagnosis not present

## 2020-11-23 DIAGNOSIS — I130 Hypertensive heart and chronic kidney disease with heart failure and stage 1 through stage 4 chronic kidney disease, or unspecified chronic kidney disease: Secondary | ICD-10-CM | POA: Diagnosis not present

## 2020-11-23 DIAGNOSIS — E1151 Type 2 diabetes mellitus with diabetic peripheral angiopathy without gangrene: Secondary | ICD-10-CM | POA: Diagnosis not present

## 2020-11-23 DIAGNOSIS — I872 Venous insufficiency (chronic) (peripheral): Secondary | ICD-10-CM | POA: Diagnosis not present

## 2020-11-23 DIAGNOSIS — E1169 Type 2 diabetes mellitus with other specified complication: Secondary | ICD-10-CM | POA: Diagnosis not present

## 2020-11-23 DIAGNOSIS — L89620 Pressure ulcer of left heel, unstageable: Secondary | ICD-10-CM | POA: Diagnosis not present

## 2020-11-23 DIAGNOSIS — D631 Anemia in chronic kidney disease: Secondary | ICD-10-CM | POA: Diagnosis not present

## 2020-11-23 DIAGNOSIS — E1122 Type 2 diabetes mellitus with diabetic chronic kidney disease: Secondary | ICD-10-CM | POA: Diagnosis not present

## 2020-11-23 DIAGNOSIS — I5022 Chronic systolic (congestive) heart failure: Secondary | ICD-10-CM | POA: Diagnosis not present

## 2020-11-26 ENCOUNTER — Other Ambulatory Visit (HOSPITAL_COMMUNITY): Payer: Self-pay | Admitting: *Deleted

## 2020-11-26 ENCOUNTER — Other Ambulatory Visit (HOSPITAL_COMMUNITY): Payer: Self-pay

## 2020-11-26 MED ORDER — ATORVASTATIN CALCIUM 80 MG PO TABS
80.0000 mg | ORAL_TABLET | Freq: Every day | ORAL | 3 refills | Status: DC
Start: 2020-11-26 — End: 2021-04-19

## 2020-11-26 MED ORDER — ISOSORBIDE MONONITRATE ER 30 MG PO TB24
30.0000 mg | ORAL_TABLET | Freq: Every day | ORAL | 3 refills | Status: DC
Start: 2020-11-26 — End: 2021-04-06

## 2020-11-26 MED ORDER — HYDRALAZINE HCL 50 MG PO TABS
50.0000 mg | ORAL_TABLET | Freq: Three times a day (TID) | ORAL | 3 refills | Status: DC
Start: 2020-11-26 — End: 2021-04-19

## 2020-11-26 MED ORDER — HYDRALAZINE HCL 50 MG PO TABS
50.0000 mg | ORAL_TABLET | Freq: Three times a day (TID) | ORAL | 3 refills | Status: DC
Start: 2020-11-26 — End: 2020-11-26

## 2020-11-26 NOTE — Progress Notes (Signed)
Paramedicine Encounter    Patient ID: Andrew [name], male    DOB: Jun 05, 1928, 86 y.o.   MRN: [id number]   Patient Care Team: [name], MD as PCP - General (Family Medicine) Larey Dresser, MD as PCP - Advanced Heart Failure (Cardiology) Larey Dresser, MD as Consulting Physician (Cardiology) Jorge Ny, LCSW as Social Worker (Licensed Clinical Social Worker) Gardiner Barefoot, DPM as Consulting Physician (Podiatry) [name], MD (Family Medicine) Patient, No Pcp Per (General Practice)  Patient Active Problem List   Diagnosis Date Noted  . Acute respiratory failure with hypercapnia ([place]) 06/02/2020  . Acute metabolic encephalopathy 43/32/9518  . CKD (chronic kidney disease), stage IV ([place]) 06/02/2020  . PAF (paroxysmal atrial fibrillation) ([place]) 06/02/2020  . Hypothyroidism 06/02/2020  . Chronic anticoagulation 06/02/2020  . Debility 06/01/2020  . Lumbar spondylosis 10/04/2018  . Chronic respiratory failure with hypoxia ([place]) 09/16/2018  . Pain of right thumb 06/19/2018  . Bilateral recurrent inguinal hernias 11/28/2017  . Carotid artery stenosis 01/12/2016  . Chronic systolic CHF (congestive heart failure) ([place]) 12/02/2015  . 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 PVD (peripheral vascular disease) ([place])   . Hereditary and idiopathic peripheral neuropathy 10/02/2015  . Peripelvic (lymphatic) cyst   . Pernicious anemia 07/15/2015  . Bilateral renal cysts 07/12/2015  . Lung nodule, 49mm RML CT 07/12/15 07/12/2015  . Stage 3 chronic kidney disease ([place]) 06/14/2015  . Paroxysmal atrial fibrillation ([place]) 05/28/2015  . AAA (abdominal aortic aneurysm) without rupture ([place]) 09/30/2014  . Cardiomyopathy, ischemic 05/23/2014  . DM (diabetes mellitus), type 2 with renal complications ([place]) 84/16/6063  . Depression, major, single episode, complete remission ([place]) 03/18/2010  . Hypothyroidism 02/03/2009  . Osteoarthritis 05/27/2008  .  Hyperlipidemia associated with type 2 diabetes mellitus ([place]) 06/14/2007  . Hypertension associated with diabetes ([place]) 06/14/2007  . GERD 06/14/2007    Current Outpatient Medications:  .  acetaminophen (TYLENOL) 500 MG tablet, Take 2 tablets (1,000 mg total) by mouth every 8 (eight) hours as needed., Disp: 30 tablet, Rfl: 0 .  amiodarone (PACERONE) 200 MG tablet, Take 0.5 tablets (100 mg total) by mouth daily., Disp: 45 tablet, Rfl: 3 .  apixaban (ELIQUIS) 2.5 MG TABS tablet, Take 2.5 mg by mouth 2 (two) times daily. , Disp: , Rfl:  .  atorvastatin (LIPITOR) 80 MG tablet, Take 80 mg by mouth daily., Disp: , Rfl:  .  carvedilol (COREG) 12.5 MG tablet, Take 12.5 mg by mouth 2 (two) times daily with a meal., Disp: , Rfl:  .  cilostazol (PLETAL) 100 MG tablet, Take 1 tablet (100 mg total) by mouth 2 (two) times daily., Disp: 180 tablet, Rfl: 3 .  ezetimibe (ZETIA) 10 MG tablet, TAKE (1) TABLET DAILY., Disp: 90 tablet, Rfl: 0 .  FLUoxetine (PROZAC) 20 MG capsule, TAKE (1) CAPSULE DAILY., Disp: 90 capsule, Rfl: 0 .  gabapentin (NEURONTIN) 100 MG capsule, Take 100 mg by mouth 3 (three) times daily., Disp: , Rfl:  .  glucose blood (ACCU-CHEK AVIVA PLUS) test strip, CHECK BLOOD SUGAR 4 TIMES A DAY., Disp: 100 each, Rfl: 11 .  hydrALAZINE (APRESOLINE) 50 MG tablet, Take 50 mg by mouth 3 (three) times daily., Disp: , Rfl:  .  isosorbide mononitrate (IMDUR) 30 MG 24 hr tablet, Take 30 mg by mouth daily., Disp: , Rfl:  .  levothyroxine (SYNTHROID) 100 MCG tablet, TAKE 1 TABLET BEFORE BREAKFAST., Disp: 90 tablet, Rfl: 0 .  Multiple Vitamins-Minerals (MULTIVITAMIN  ADULTS 50+) TABS, Take 1 tablet by mouth daily., Disp: , Rfl:  .  pantoprazole (PROTONIX) 40 MG tablet, Take 1 tablet (40 mg total) by mouth 2 (two) times daily., Disp: , Rfl:  .  potassium chloride SA (KLOR-CON) 20 MEQ tablet, Take 20 mEq by mouth daily., Disp: , Rfl:  .  tiotropium (SPIRIVA) 18 MCG inhalation capsule, Place 18 mcg into inhaler  and inhale daily as needed (for wheezing)., Disp: , Rfl:  .  torsemide (DEMADEX) 20 MG tablet, Take 4 tablets (80 mg total) by mouth 2 (two) times daily., Disp: 240 tablet, Rfl: 3 .  HYDROcodone-acetaminophen (NORCO/VICODIN) 5-325 MG tablet, TAKE 1 TABLET EVERY 6 HOURS AS NEEDED FOR MODERATE PAIN. (Patient not taking: Reported on 10/22/2020), Disp: 12 tablet, Rfl: 0 .  metolazone (ZAROXOLYN) 2.5 MG tablet, Take 1 tablet (2.5 mg total) by mouth as directed. By HF Clinic (Patient not taking: Reported on 10/22/2020), Disp: 3 tablet, Rfl: 0 .  nitroGLYCERIN (NITROSTAT) 0.4 MG SL tablet, DISSOLVE 1 TABLET UNDER TONGUE AS NEEDED FOR CHEST PAIN,MAY REPEAT IN5 MINUTES FOR 2 DOSES. (Patient not taking: Reported on 10/22/2020), Disp: 25 tablet, Rfl: 3 Allergies  Allergen Reactions  . Metoprolol Shortness Of Breath, Palpitations and Other (See Comments)    Heart starts racing. Shallow breathing; pt states he also get skin irritation on his legs  . Metformin Hives and Swelling    On legs  . Metformin And Related Nausea And Vomiting      Social History   Socioeconomic History  . Marital status: Married    Spouse name: Not on file  . Number of children: Not on file  . Years of education: Not on file  . Highest education level: Not on file  Occupational History  . Occupation: Retired  Tobacco Use  . Smoking status: Never Smoker  . Smokeless tobacco: Never Used  . Tobacco comment: quit atleast 25 yrs ago, per pt  Vaping Use  . Vaping Use: Never used  Substance and Sexual Activity  . Alcohol use: Never    Comment: socially  . Drug use: Never  . Sexual activity: Not Currently  Other Topics Concern  . Not on file  Social History Narrative   ** Merged History Encounter **       Pt lives in [place] with spouse. Retired from [employer].  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   Social Determinants of Health   Financial Resource Strain:   .  Difficulty of Paying Living Expenses: Not on file  Food Insecurity:   . Worried About Charity fundraiser in the Last Year: Not on file  . Ran Out of Food in the Last Year: Not on file  Transportation Needs:   . Lack of Transportation (Medical): Not on file  . Lack of Transportation (Non-Medical): Not on file  Physical Activity:   . Days of Exercise per Week: Not on file  . Minutes of Exercise per Session: Not on file  Stress:   . Feeling of Stress : Not on file  Social Connections:   . Frequency of Communication with Friends and Family: Not on file  . Frequency of Social Gatherings with Friends and Family: Not on file  . Attends Religious Services: Not on file  . Active Member of Clubs or Organizations: Not on file  . Attends Archivist Meetings: Not on file  . Marital Status: Not on file  Intimate Partner Violence:   .  Fear of Current or Ex-Partner: Not on file  . Emotionally Abused: Not on file  . Physically Abused: Not on file  . Sexually Abused: Not on file    Physical Exam Cardiovascular:     Rate and Rhythm: Normal rate and regular rhythm.     Pulses: Normal pulses.  Pulmonary:     Effort: Pulmonary effort is normal.     Breath sounds: Normal breath sounds.  Musculoskeletal:        General: Normal range of motion.     Right lower leg: No edema.     Left lower leg: No edema.  Skin:    General: Skin is warm and dry.     Capillary Refill: Capillary refill takes less than 2 seconds.  Neurological:     Mental Status: He is alert and oriented to person, place, and time.  Psychiatric:        Mood and Affect: Mood normal.         Future Appointments  Date Time Provider [place]  11/27/2020  1:30 PM Ricard Dillon, MD [facility]  01/14/2021  1:40 PM Larey Dresser, MD [facility] None    BP 118/60 (BP Location: Left Arm, Patient Position: Sitting, Cuff Size: Normal)   Pulse 65   Resp 16   Wt 173 lb (78.5 kg)   SpO2 95%   BMI 27.10  kg/m   Weight yesterday- did not weigh Last visit weight- 168 lb  Mr Chillemi was seen at home today and reported feeling well. He denied chest pain, SOB, headache, dizziness, orthopnea, fever or cough since our last visit. He stated he has been compliant with his medications but his pillbox reflected serveral missed doses since our last visit. I stressed the importance of medications compliance and he was understanding. His medications were verified and his pillbox was refilled. I will follow up next week unless it becomes necessary sooner.   [name], EMT 11/26/20  ACTION: Home visit completed Next visit planned for 1 week

## 2020-11-27 ENCOUNTER — Other Ambulatory Visit: Payer: Self-pay

## 2020-11-27 ENCOUNTER — Encounter (HOSPITAL_BASED_OUTPATIENT_CLINIC_OR_DEPARTMENT_OTHER): Payer: Medicare PPO | Attending: Internal Medicine | Admitting: Internal Medicine

## 2020-11-27 DIAGNOSIS — I87333 Chronic venous hypertension (idiopathic) with ulcer and inflammation of bilateral lower extremity: Secondary | ICD-10-CM | POA: Insufficient documentation

## 2020-11-27 DIAGNOSIS — L97829 Non-pressure chronic ulcer of other part of left lower leg with unspecified severity: Secondary | ICD-10-CM | POA: Insufficient documentation

## 2020-11-27 DIAGNOSIS — L97811 Non-pressure chronic ulcer of other part of right lower leg limited to breakdown of skin: Secondary | ICD-10-CM | POA: Insufficient documentation

## 2020-11-27 DIAGNOSIS — E11621 Type 2 diabetes mellitus with foot ulcer: Secondary | ICD-10-CM | POA: Diagnosis not present

## 2020-11-27 DIAGNOSIS — L97818 Non-pressure chronic ulcer of other part of right lower leg with other specified severity: Secondary | ICD-10-CM | POA: Diagnosis not present

## 2020-11-27 DIAGNOSIS — L97812 Non-pressure chronic ulcer of other part of right lower leg with fat layer exposed: Secondary | ICD-10-CM | POA: Diagnosis not present

## 2020-11-27 DIAGNOSIS — L97412 Non-pressure chronic ulcer of right heel and midfoot with fat layer exposed: Secondary | ICD-10-CM | POA: Diagnosis not present

## 2020-11-27 DIAGNOSIS — L97822 Non-pressure chronic ulcer of other part of left lower leg with fat layer exposed: Secondary | ICD-10-CM | POA: Diagnosis not present

## 2020-11-27 DIAGNOSIS — L97422 Non-pressure chronic ulcer of left heel and midfoot with fat layer exposed: Secondary | ICD-10-CM | POA: Diagnosis not present

## 2020-11-27 NOTE — Progress Notes (Signed)
Andrew, [name] ([id number]) Visit Report for 11/27/2020 Debridement Details Patient Name: Date of Service: Andrew [name], Andrew RGE A. 11/27/2020 1:30 PM Medical Record Number: [id number] Patient Account Number: [id number] Date of Birth/Sex: Treating RN: 09/26/1928 (86 y.o. Andrew [name] Primary Care Provider: Dimas [name] Other Clinician: Referring Provider: Treating Provider/Extender: Andrew [name] in Treatment: 11 Debridement Performed for Assessment: Wound #2 Left Calcaneus Performed By: Physician Andrew [name]., MD Debridement Type: Debridement Severity of Tissue Pre Debridement: Fat layer exposed Level of Consciousness (Pre-procedure): Awake and Alert Pre-procedure Verification/Time Out Yes - 14:30 Taken: Start Time: 14:31 Pain Control: Other : benzocaine 20% spray T Area Debrided (L x W): otal 0.5 (cm) x 0.8 (cm) = 0.4 (cm) Tissue and other material debrided: Viable, Non-Viable, Slough, Subcutaneous, Slough Level: Skin/Subcutaneous Tissue Debridement Description: Excisional Instrument: Curette Bleeding: Minimum Hemostasis Achieved: Pressure End Time: 14:35 Procedural Pain: 5 Post Procedural Pain: 3 Response to Treatment: Procedure was tolerated well Level of Consciousness (Post- Awake and Alert procedure): Post Debridement Measurements of Total Wound Length: (cm) 0.5 Width: (cm) 0.8 Depth: (cm) 0.1 Volume: (cm) 0.031 Character of Wound/Ulcer Post Debridement: Improved Severity of Tissue Post Debridement: Fat layer exposed Post Procedure Diagnosis Same as Pre-procedure Electronic Signature(s) Signed: 11/27/2020 5:01:53 PM By: Andrew Gouty RN, BSN Signed: 11/27/2020 5:12:12 PM By: Andrew Ham MD Entered By: Andrew [name] 15:01:19 -------------------------------------------------------------------------------- Debridement Details Patient Name: Date of Service: Andrew [name], Andrew RGE A. 11/27/2020 1:30 PM Medical Record  Number: [id number] Patient Account Number: [id number] Date of Birth/Sex: Treating RN: [birth year] (86 y.o. Andrew [name] Primary Care Provider: Dimas [name] Other Clinician: Referring Provider: Treating Provider/Extender: Andrew [name] in Treatment: 11 Debridement Performed for Assessment: Wound #4 Right,Proximal,Anterior Lower Leg Performed By: Physician Andrew [name]., MD Debridement Type: Debridement Severity of Tissue Pre Debridement: Fat layer exposed Level of Consciousness (Pre-procedure): Awake and Alert Pre-procedure Verification/Time Out Yes - 14:30 Taken: Start Time: 14:31 Pain Control: Other : benzocaine 20% spray T Area Debrided (L x W): otal 2.3 (cm) x 1.7 (cm) = 3.91 (cm) Tissue and other material debrided: Viable, Non-Viable, Slough, Subcutaneous, Slough Level: Skin/Subcutaneous Tissue Debridement Description: Excisional Instrument: Curette Bleeding: Minimum Hemostasis Achieved: Pressure End Time: 14:35 Procedural Pain: 5 Post Procedural Pain: 3 Response to Treatment: Procedure was tolerated well Level of Consciousness (Post- Awake and Alert procedure): Post Debridement Measurements of Total Wound Length: (cm) 2.3 Width: (cm) 1.7 Depth: (cm) 0.1 Volume: (cm) 0.307 Character of Wound/Ulcer Post Debridement: Improved Severity of Tissue Post Debridement: Fat layer exposed Post Procedure Diagnosis Same as Pre-procedure Electronic Signature(s) Signed: 11/27/2020 5:01:53 PM By: Andrew Gouty RN, BSN Signed: 11/27/2020 5:12:12 PM By: Andrew Ham MD Entered By: Andrew [name] 15:01:32 -------------------------------------------------------------------------------- Debridement Details Patient Name: Date of Service: Andrew [name], Andrew RGE A. 11/27/2020 1:30 PM Medical Record Number: [id number] Patient Account Number: [id number] Date of Birth/Sex: Treating RN: 11/16/1928 (86 y.o. Andrew [name] Primary Care  Provider: Dimas [name] Other Clinician: Referring Provider: Treating Provider/Extender: Andrew [name] in Treatment: 11 Debridement Performed for Assessment: Wound #5 Left,Medial Lower Leg Performed By: Physician Andrew [name]., MD Debridement Type: Debridement Severity of Tissue Pre Debridement: Fat layer exposed Level of Consciousness (Pre-procedure): Awake and Alert Pre-procedure Verification/Time Out Yes - 14:30 Taken: Start Time: 14:31 Pain Control: Other : benzocaine 20% spray T Area Debrided (L x W): otal 0.2 (cm) x 0.2 (cm) = 0.04 (cm) Tissue and other material debrided: Viable, Non-Viable, Slough,  Subcutaneous, Slough Level: Skin/Subcutaneous Tissue Debridement Description: Excisional Instrument: Curette Bleeding: Minimum Hemostasis Achieved: Pressure End Time: 14:35 Procedural Pain: 5 Post Procedural Pain: 3 Response to Treatment: Procedure was tolerated well Level of Consciousness (Post- Awake and Alert procedure): Post Debridement Measurements of Total Wound Length: (cm) 0.4 Width: (cm) 0.2 Depth: (cm) 0.1 Volume: (cm) 0.006 Character of Wound/Ulcer Post Debridement: Improved Severity of Tissue Post Debridement: Fat layer exposed Post Procedure Diagnosis Same as Pre-procedure Electronic Signature(s) Signed: 11/27/2020 5:01:53 PM By: Andrew Gouty RN, BSN Signed: 11/27/2020 5:12:12 PM By: Andrew Ham MD Entered By: Andrew [name] 15:01:51 -------------------------------------------------------------------------------- HPI Details Patient Name: Date of Service: Andrew [name], Andrew RGE A. 11/27/2020 1:30 PM Medical Record Number: [id number] Patient Account Number: [id number] Date of Birth/Sex: Treating RN: [birth year] (86 y.o. Andrew [name] Primary Care Provider: Dimas [name] Other Clinician: Referring Provider: Treating Provider/Extender: Andrew [name] in Treatment: 11 History of Present  Illness HPI Description: 09/11/2020 ADMISSION This is a soon-to-be 86 year old man who was hospitalized in June and subsequently spent time at [place] home. At some point he developed a pressure ulcer on his left heel. On discharge he was seen by podiatry and referred to Andrew [name] with regards to the possibility of PAD. The patient underwent angiography on 08/10/2020 this showed heavily calcified vessels. The common iliac artery on the left appeared to have calcific disease but there was no gradient. Left common external leg arteries were heavily tortuous left lower extremity limited angiography developed demonstrated a SFA to be helped heavily calcified throughout with multiple greater than 50% stenosis but never frankly occludes he was not felt to be a candidate for endovascular repair. I think they had a minimal look at the right side and it appeared that the SFA was occluded and reconstitutes above the knee. The overall feeling was that if he could not heal his left heel ulcer he will eventually best be served by a left above-knee amputation The last I can see is that he should have been using a Santyl wet-to-dry dressing to the heel. During his stay with podiatry was also noted that he had venous insufficiency and I think venous insufficiency wounds. His wife tells me that he has been recommended for compression stockings in the past but the patient refused, he has wounds on his bilateral anterior lower legs right greater than the left. Chronic venous inflammation and dry flaking skin. Past medical history; heart failure, right heel wound, paroxysmal atrial fibrillation on Eliquis, abdominal aortic aneurysm with previous repair, COPD, DVT, melanoma on his back, diabetes with a recent hemoglobin A1c of 7.3 however he is not on any treatment [type II], PAD, coronary artery disease, hypertension The patient's most recent arterial studies were on 05/28/2020. This showed an ABI on the right at  1.12 but monophasic waveforms and absent waveforms at the posterior tibial artery. On the left his ABI was 1.47 [noncompressible] he had triphasic waveforms at the ATA and monophasic at the PTA. His TBI on the right was 1.12 on the left and 0.97. It was felt his ABIs were falsely elevated 9/24. The patient followed up with Andrew [name] and had noninvasive arterial studies. On the right his ABIs were noncompressible monophasic waveforms with a TBI of 1.45 and a right great toe pressure of 172. On the left he had monophasic waveforms with noncompressible ABIs TBI of 0.79 and a left great toe pressure of 94. He will be seen in follow-up by Andrew [name] in a  month's time. The patient also has very significant chronic venous insufficiency 10/8; patient with known PAD. He came in with bilateral lower extremity wounds likely secondary to venous stasis. Still has one on the upper right tibial area. The area on the left leg is healed however he still has an area on the left heel. This needed an extensive debridement today. His wife brings in notes apparently Dr. March Rummage ordered an MRI of the left heel. I was unaware of this. I was not aware that he did not been seen by Dr. March Rummage and I do not have any records of this. Apparently MRI called to set up the appointment. I will need to look through epic on this 10/15; this is a patient with known PAD who was revascularized. He also has venous stasis disease. The area that was on the right anterior tibia has actually close down although he has a still a small area on the upper anterior tibia. 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 We have been using Iodoflex on this area and he is required debridements. We tried to get her to agree to an MRI that have been ordered by Dr. March Rummage. However she did not  get back to the MRI people or they have not called her back. We will reorder this. He is quite tender around this wound and I wonder about osteomyelitis. 10/29; MRI. Fortunately this did not show any evidence of abscess or osteomyelitis in the left heel. Vein and vascular on 10/21; this is a patient who is previously had an angiogram. He had a heavily calcified aorta and iliac vessels as well as the SFA. He had multiple areas of diffuse 50% stenosis. No interventions were apparently done by Andrew [name] he does have an occluded right SFA but with above-knee reconstitution. He was not felt to be a candidate for revascularization for his diffuse areas of greater than 50% stenosis. It was not felt that these were amenable to intervention Since he was last here he went to see dermatology. I believe he had skin cancer extractions from the right area anteriorly and 2 on the left medial. 11/12; left heel actually looks somewhat better but still requiring debridement. We've been using Iodoflex Right anterior mid tibia surgical extraction for a skin cancer. This also required debridement The area on the left lower medial leg looks a lot better and is just about fully epithelialized 12/3; pressure ulcer on the left heel in the setting of PAD. This looks better still requiring debridement we have been using Iodoflex Right anterior mid tibia surgical extraction for cancer this also continually requires debridement although the wound measures smaller The area on the left lower medial leg had much more eschar on this than last time this is a deterioration. Notable for the fact that home health only wrap the leg halfway and actually had one of the wraps at the level of the wound on the right anterior. Electronic Signature(s) Signed: 11/27/2020 5:12:12 PM By: Andrew Ham MD Entered By: Andrew [name] 15:03:35 -------------------------------------------------------------------------------- Physical  Exam Details Patient Name: Date of Service: Andrew [name], Andrew RGE A. 11/27/2020 1:30 PM Medical Record Number: [id number] Patient Account Number: [id number] Date of Birth/Sex: Treating RN: Apr 24, 1928 (86 y.o. Andrew [name] Primary Care Provider: Dimas [name] Other Clinician: Referring Provider: Treating  Provider/Extender: Andrew [name] in Treatment: 11 Constitutional Sitting or standing Blood Pressure is within target range for patient.. Pulse regular and within target range for patient.Marland Kitchen Respirations regular, non-labored and within target range.. Temperature is normal and within the target range for the patient.Marland Kitchen Appears in no distress. Notes Wound exam The heel once again had adherent debris and eschar around the surface used a #5 curette to remove this hemostasis with direct pressure Right anterior lower leg 100% anterior debris although measured smaller #5 curette to remove this hemostasis with direct pressure Left medial lower leg had eschar and debris over the wound surface although it is small I remove this to a superficial layer hemostasis with direct pressure Electronic Signature(s) Signed: 11/27/2020 5:12:12 PM By: Andrew Ham MD Entered By: Andrew [name] 29:56:21 -------------------------------------------------------------------------------- Physician Orders Details Patient Name: Date of Service: Andrew [name], Andrew RGE A. 11/27/2020 1:30 PM Medical Record Number: [id number] Patient Account Number: [id number] Date of Birth/Sex: Treating RN: 10-15-1928 (86 y.o. 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 Bathing/ Shower/ Hygiene May shower with protection but do not get wound dressing(s) wet. Edema Control - Lymphedema / SCD / Other Bilateral Lower Extremities Elevate legs to the level of the heart or above for 30 minutes daily and/or when sitting, a frequency of: Avoid standing for long periods of time. Exercise regularly Home Health No change in wound care orders this week; continue Home Health for wound care. May utilize formulary equivalent dressing for wound treatment orders unless otherwise specified. Other Home Health Orders/Instructions: - [facility] Wound Treatment Wound #2 - Calcaneus Wound Laterality: Left Cleanser: Normal Saline (Home Health) 2 x Per Week/30 Days Discharge Instructions: Cleanse the wound with Normal Saline prior to applying a clean dressing using gauze sponges, not tissue or cotton balls. Peri-Wound Care: Sween Lotion (Moisturizing lotion) (Home Health) 2 x Per Week/30 Days Discharge Instructions: Apply moisturizing lotion or equivalent moisterizing lotion to legs under wraps Prim Dressing: IODOFLEX 0.9% Cadexomer Iodine Pad 4x6 cm (Home Health) 2 x Per Week/30 Days ary Discharge Instructions: Apply to wound bed as instructed Secondary Dressing: Woven Gauze Sponge, Non-Sterile 4x4 in (Home Health) 2 x Per Week/30 Days Discharge Instructions: Apply over primary dressing as directed. Secondary Dressing: ALLEVYN  Heel 4 1/2in x 5 1/2in / 10.5cm x 13.5cm (Home Health) 2 x Per Week/30 Days Discharge Instructions: Apply over primary dressing as directed. Compression Wrap: Kerlix Roll 4.5x3.1 (in/yd) 2 x Per Week/30 Days Discharge Instructions: Apply Kerlix and Coban compression , wrap from base of toes to knee Compression Wrap: Coban Self-Adherent Wrap 4x5 (in/yd) 2 x Per Week/30 Days Discharge Instructions: Apply over Kerlix as directed. Wound #4 - Lower Leg Wound Laterality: Right, Anterior, Proximal Cleanser: Normal Saline (Home Health) 2 x Per  Week/30 Days Discharge Instructions: Cleanse the wound with Normal Saline prior to applying a clean dressing using gauze sponges, not tissue or cotton balls. Peri-Wound Care: Sween Lotion (Moisturizing lotion) (Home Health) 2 x Per Week/30 Days Discharge Instructions: Apply moisturizing lotion or equivalent moisterizing lotion to legs under wraps Prim Dressing: IODOFLEX 0.9% Cadexomer Iodine Pad 4x6 cm (Home Health) 2 x Per Week/30 Days ary Discharge Instructions: Apply to wound bed as instructed Secondary Dressing: Woven Gauze Sponge, Non-Sterile 4x4 in (Home Health) 2 x Per Week/30 Days Discharge Instructions: Apply over primary dressing as directed. Compression Wrap: Kerlix Roll 4.5x3.1 (in/yd) 2 x Per Week/30 Days Discharge Instructions: Apply Kerlix and Coban compression , wrap from base of toes to knee Compression Wrap: Coban Self-Adherent Wrap 4x5 (in/yd) 2 x Per Week/30 Days Discharge Instructions: Apply over Kerlix as directed. Wound #5 - Lower Leg Wound Laterality: Left, Medial Cleanser: Normal Saline (Home Health) 2 x Per Week/30 Days Discharge Instructions: Cleanse the wound with Normal Saline prior to applying a clean dressing using gauze sponges, not tissue or cotton balls. Peri-Wound Care: Sween Lotion (Moisturizing lotion) (Home Health) 2 x Per Week/30 Days Discharge Instructions: Apply moisturizing lotion or equivalent moisterizing lotion to  legs under wraps Prim Dressing: IODOFLEX 0.9% Cadexomer Iodine Pad 4x6 cm (Home Health) 2 x Per Week/30 Days ary Discharge Instructions: Apply to wound bed as instructed Secondary Dressing: Woven Gauze Sponge, Non-Sterile 4x4 in (Home Health) 2 x Per Week/30 Days Discharge Instructions: Apply over primary dressing as directed. Compression Wrap: Kerlix Roll 4.5x3.1 (in/yd) 2 x Per Week/30 Days Discharge Instructions: Apply Kerlix and Coban compression , wrap from base of toes to knee Compression Wrap: Coban Self-Adherent Wrap 4x5 (in/yd) 2 x Per Week/30 Days Discharge Instructions: Apply over Kerlix as directed. Electronic Signature(s) Signed: 11/27/2020 5:01:53 PM By: Andrew Gouty RN, BSN Signed: 11/27/2020 5:12:12 PM By: Andrew Ham MD Entered By: Andrew [name] on [name] 14:40:00 -------------------------------------------------------------------------------- Problem List Details Patient Name: Date of Service: Andrew [name], Andrew RGE A. 11/27/2020 1:30 PM Medical Record Number: [id number] Patient Account Number: [id number] Date of Birth/Sex: Treating RN: 03/10/1928 (86 y.o. Andrew [name] Primary Care Provider: Dimas [name] Other Clinician: Referring Provider: Treating Provider/Extender: Andrew [name] in Treatment: 11 Active Problems ICD-10 Encounter Code Description Active Date MDM Diagnosis E11.621 Type 2 diabetes mellitus with foot ulcer 09/11/2020 No Yes L97.422 Non-pressure chronic ulcer of left heel and midfoot with fat layer exposed 09/11/2020 No Yes I87.333 Chronic venous hypertension (idiopathic) with ulcer and inflammation of 09/11/2020 No Yes bilateral lower extremity L97.811 Non-pressure chronic ulcer of other part of right lower leg limited to breakdown 09/11/2020 No Yes of skin L97.818 Non-pressure chronic ulcer of other part of right lower leg with other specified 10/23/2020 No Yes severity L97.829 Non-pressure chronic ulcer of other  part of left lower leg with unspecified 10/23/2020 No Yes severity T81.31XA Disruption of external operation (surgical) wound, not elsewhere classified, 10/23/2020 No Yes initial encounter Inactive Problems ICD-10 Code Description Active Date Inactive Date L97.821 Non-pressure chronic ulcer of other part of left lower leg limited to breakdown of skin 09/11/2020 09/11/2020 Resolved Problems Electronic Signature(s) Signed: 11/27/2020 5:12:12 PM By: Andrew Ham MD Entered By: Andrew [name] 15:00:55 -------------------------------------------------------------------------------- Progress Note Details Patient Name: Date of Service: Andrew [name], Andrew RGE A. 11/27/2020 1:30 PM Medical Record Number: [id number] Patient Account Number: [id number] Date of Birth/Sex: Treating RN: Apr 21, 1928 (86 y.o. Andrew [name] Primary Care Provider: Dimas [name] Other Clinician: Referring Provider: Treating  Provider/Extender: Andrew [name] in Treatment: 11 Subjective History of Present Illness (HPI) 09/11/2020 ADMISSION This is a soon-to-be 86 year old man who was hospitalized in June and subsequently spent time at [place] [facility]. At some point he developed a pressure ulcer on his left heel. On discharge he was seen by podiatry and referred to Andrew [name] with regards to the possibility of PAD. The patient underwent angiography on 08/10/2020 this showed heavily calcified vessels. The common iliac artery on the left appeared to have calcific disease but there was no gradient. Left common external leg arteries were heavily tortuous left lower extremity limited angiography developed demonstrated a SFA to be helped heavily calcified throughout with multiple greater than 50% stenosis but never frankly occludes he was not felt to be a candidate for endovascular repair. I think they had a minimal look at the right side and it appeared that the SFA was occluded and  reconstitutes above the knee. The overall feeling was that if he could not heal his left heel ulcer he will eventually best be served by a left above-knee amputation The last I can see is that he should have been using a Santyl wet-to-dry dressing to the heel. During his stay with podiatry was also noted that he had venous insufficiency and I think venous insufficiency wounds. His wife tells me that he has been recommended for compression stockings in the past but the patient refused, he has wounds on his bilateral anterior lower legs right greater than the left. Chronic venous inflammation and dry flaking skin. Past medical history; heart failure, right heel wound, paroxysmal atrial fibrillation on Eliquis, abdominal aortic aneurysm with previous repair, COPD, DVT, melanoma on his back, diabetes with a recent hemoglobin A1c of 7.3 however he is not on any treatment [type II], PAD, coronary artery disease, hypertension The patient's most recent arterial studies were on 05/28/2020. This showed an ABI on the right at 1.12 but monophasic waveforms and absent waveforms at the posterior tibial artery. On the left his ABI was 1.47 [noncompressible] he had triphasic waveforms at the ATA and monophasic at the PTA. His TBI on the right was 1.12 on the left and 0.97. It was felt his ABIs were falsely elevated 9/24. The patient followed up with Andrew [name] and had noninvasive arterial studies. On the right his ABIs were noncompressible monophasic waveforms with a TBI of 1.45 and a right great toe pressure of 172. On the left he had monophasic waveforms with noncompressible ABIs TBI of 0.79 and a left great toe pressure of 94. He will be seen in follow-up by Andrew [name] in a month's time. The patient also has very significant chronic venous insufficiency 10/8; patient with known PAD. He came in with bilateral lower extremity wounds likely secondary to venous stasis. Still has one on the upper right tibial area. The area  on the left leg is healed however he still has an area on the left heel. This needed an extensive debridement today. His wife brings in notes apparently Dr. March Rummage ordered an MRI of the left heel. I was unaware of this. I was not aware that he did not been seen by Dr. March Rummage and I do not have any records of this. Apparently MRI called to set up the appointment. I will need to look through epic on this 10/15; this is a patient with known PAD who was revascularized. He also has venous stasis disease. The area that was on the right anterior tibia has actually  close down although he has a still a small area on the upper anterior tibia. 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 We have been using Iodoflex on this area and he is required debridements. We tried to get her to agree to an MRI that have been ordered by Dr. March Rummage. However she did not get back to the MRI people or they have not called her back. We will reorder this. He is quite tender around this wound and I wonder about osteomyelitis. 10/29; MRI. Fortunately this did not show any evidence of abscess or osteomyelitis in the left heel. Vein and vascular on 10/21; this is a patient who is previously had an angiogram. He had a heavily calcified aorta and iliac vessels as well as the SFA. He had multiple areas of diffuse 50% stenosis. No interventions were apparently done by Andrew [name] he does have an occluded right SFA but with above-knee reconstitution. He was not felt to be a candidate for revascularization for his diffuse areas of greater than 50% stenosis. It was not felt that these were amenable to intervention Since he was last here he went to see dermatology. I believe he had skin cancer extractions from the right area anteriorly and 2 on the left medial. 11/12; left heel  actually looks somewhat better but still requiring debridement. We've been using Iodoflex ooRight anterior mid tibia surgical extraction for a skin cancer. This also required debridement ooThe area on the left lower medial leg looks a lot better and is just about fully epithelialized 12/3; pressure ulcer on the left heel in the setting of PAD. This looks better still requiring debridement we have been using Iodoflex ooRight anterior mid tibia surgical extraction for cancer this also continually requires debridement although the wound measures smaller ooThe area on the left lower medial leg had much more eschar on this than last time this is a deterioration. ooNotable for the fact that home health only wrap the leg halfway and actually had one of the wraps at the level of the wound on the right anterior. Objective Constitutional Sitting or standing Blood Pressure is within target range for patient.. Pulse regular and within target range for patient.Marland Kitchen Respirations regular, non-labored and within target range.. Temperature is normal and within the target range for the patient.Marland Kitchen Appears in no distress. Vitals Time Taken: 1:58 PM, Height: 67 in, Weight: 185 lbs, BMI: 29, Temperature: 98.2 F, Pulse: 64 bpm, Respiratory Rate: 18 breaths/min, Blood Pressure: 112/66 mmHg. General Notes: Wound exam ooThe heel once again had adherent debris and eschar around the surface used a #5 curette to remove this hemostasis with direct pressure ooRight anterior lower leg 100% anterior debris although measured smaller #5 curette to remove this hemostasis with direct pressure ooLeft medial lower leg had eschar and debris over the wound surface although it is small I remove this to a superficial layer hemostasis with direct pressure Integumentary (Hair, Skin) Wound #2 status is Open. Original cause of wound was Gradually Appeared. The wound is located on the Left Calcaneus. The wound measures 0.5cm length x 0.8cm  width x 0.1cm depth; 0.314cm^2 area and 0.031cm^3 volume. There is Fat Layer (Subcutaneous Tissue) exposed. There is no tunneling or undermining noted. There is a medium amount of serosanguineous drainage noted. The wound margin is flat and intact.  There is large (67-100%) pink granulation within the wound bed. There is a small (1-33%) amount of necrotic tissue within the wound bed including Adherent Slough. Wound #4 status is Open. Original cause of wound was Gradually Appeared. The wound is located on the Right,Proximal,Anterior Lower Leg. The wound measures 2.3cm length x 1.7cm width x 0.1cm depth; 3.071cm^2 area and 0.307cm^3 volume. There is Fat Layer (Subcutaneous Tissue) exposed. There is no tunneling or undermining noted. There is a medium amount of serosanguineous drainage noted. The wound margin is flat and intact. There is medium (34-66%) red granulation within the wound bed. There is a medium (34-66%) amount of necrotic tissue within the wound bed including Adherent Slough. Wound #5 status is Open. Original cause of wound was Gradually Appeared. The wound is located on the Left,Medial Lower Leg. The wound measures 0.2cm length x 0.2cm width x 0.2cm depth; 0.031cm^2 area and 0.006cm^3 volume. There is Fat Layer (Subcutaneous Tissue) exposed. There is no tunneling or undermining noted. There is a medium amount of serosanguineous drainage noted. The wound margin is distinct with the outline attached to the wound base. There is large (67-100%) red granulation within the wound bed. There is no necrotic tissue within the wound bed. Assessment Active Problems ICD-10 Type 2 diabetes mellitus with foot ulcer Non-pressure chronic ulcer of left heel and midfoot with fat layer exposed Chronic venous hypertension (idiopathic) with ulcer and inflammation of bilateral lower extremity Non-pressure chronic ulcer of other part of right lower leg limited to breakdown of skin Non-pressure chronic ulcer of  other part of right lower leg with other specified severity Non-pressure chronic ulcer of other part of left lower leg with unspecified severity Disruption of external operation (surgical) wound, not elsewhere classified, initial encounter Procedures Wound #2 Pre-procedure diagnosis of Wound #2 is a Diabetic Wound/Ulcer of the Lower Extremity located on the Left Calcaneus .Severity of Tissue Pre Debridement is: Fat layer exposed. There was a Excisional Skin/Subcutaneous Tissue Debridement with a total area of 0.4 sq cm performed by Andrew [name]., MD. With the following instrument(s): Curette to remove Viable and Non-Viable tissue/material. Material removed includes Subcutaneous Tissue and Slough and after achieving pain control using Other (benzocaine 20% spray). No specimens were taken. A time out was conducted at 14:30, prior to the start of the procedure. A Minimum amount of bleeding was controlled with Pressure. The procedure was tolerated well with a pain level of 5 throughout and a pain level of 3 following the procedure. Post Debridement Measurements: 0.5cm length x 0.8cm width x 0.1cm depth; 0.031cm^3 volume. Character of Wound/Ulcer Post Debridement is improved. Severity of Tissue Post Debridement is: Fat layer exposed. Post procedure Diagnosis Wound #2: Same as Pre-Procedure Wound #4 Pre-procedure diagnosis of Wound #4 is a Venous Leg Ulcer located on the Right,Proximal,Anterior Lower Leg .Severity of Tissue Pre Debridement is: Fat layer exposed. There was a Excisional Skin/Subcutaneous Tissue Debridement with a total area of 3.91 sq cm performed by Andrew [name]., MD. With the following instrument(s): Curette to remove Viable and Non-Viable tissue/material. Material removed includes Subcutaneous Tissue and Slough and after achieving pain control using Other (benzocaine 20% spray). No specimens were taken. A time out was conducted at 14:30, prior to the start of the procedure. A  Minimum amount of bleeding was controlled with Pressure. The procedure was tolerated well with a pain level of 5 throughout and a pain level of 3 following the procedure. Post Debridement Measurements: 2.3cm length x 1.7cm width x 0.1cm depth;  0.307cm^3 volume. Character of Wound/Ulcer Post Debridement is improved. Severity of Tissue Post Debridement is: Fat layer exposed. Post procedure Diagnosis Wound #4: Same as Pre-Procedure Wound #5 Pre-procedure diagnosis of Wound #5 is a Diabetic Wound/Ulcer of the Lower Extremity located on the Left,Medial Lower Leg .Severity of Tissue Pre Debridement is: Fat layer exposed. There was a Excisional Skin/Subcutaneous Tissue Debridement with a total area of 0.04 sq cm performed by Andrew [name]., MD. With the following instrument(s): Curette to remove Viable and Non-Viable tissue/material. Material removed includes Subcutaneous Tissue and Slough and after achieving pain control using Other (benzocaine 20% spray). No specimens were taken. A time out was conducted at 14:30, prior to the start of the procedure. A Minimum amount of bleeding was controlled with Pressure. The procedure was tolerated well with a pain level of 5 throughout and a pain level of 3 following the procedure. Post Debridement Measurements: 0.4cm length x 0.2cm width x 0.1cm depth; 0.006cm^3 volume. Character of Wound/Ulcer Post Debridement is improved. Severity of Tissue Post Debridement is: Fat layer exposed. Post procedure Diagnosis Wound #5: Same as Pre-Procedure Plan Follow-up Appointments: Return Appointment in 2 weeks. Bathing/ Shower/ Hygiene: May shower with protection but do not get wound dressing(s) wet. Edema Control - Lymphedema / SCD / Other: Elevate legs to the level of the heart or above for 30 minutes daily and/or when sitting, a frequency of: Avoid standing for long periods of time. Exercise regularly Home Health: No change in wound care orders this week; continue  Home Health for wound care. May utilize formulary equivalent dressing for wound treatment orders unless otherwise specified. Other Home Health Orders/Instructions: - [facility] WOUND #2: - Calcaneus Wound Laterality: Left Cleanser: Normal Saline (Home Health) 2 x Per Week/30 Days Discharge Instructions: Cleanse the wound with Normal Saline prior to applying a clean dressing using gauze sponges, not tissue or cotton balls. Peri-Wound Care: Sween Lotion (Moisturizing lotion) (Home Health) 2 x Per Week/30 Days Discharge Instructions: Apply moisturizing lotion or equivalent moisterizing lotion to legs under wraps Prim Dressing: IODOFLEX 0.9% Cadexomer Iodine Pad 4x6 cm (Home Health) 2 x Per Week/30 Days ary Discharge Instructions: Apply to wound bed as instructed Secondary Dressing: Woven Gauze Sponge, Non-Sterile 4x4 in (Home Health) 2 x Per Week/30 Days Discharge Instructions: Apply over primary dressing as directed. Secondary Dressing: ALLEVYN Heel 4 1/2in x 5 1/2in / 10.5cm x 13.5cm (Home Health) 2 x Per Week/30 Days Discharge Instructions: Apply over primary dressing as directed. Com pression Wrap: Kerlix Roll 4.5x3.1 (in/yd) 2 x Per Week/30 Days Discharge Instructions: Apply Kerlix and Coban compression , wrap from base of toes to knee Com pression Wrap: Coban Self-Adherent Wrap 4x5 (in/yd) 2 x Per Week/30 Days Discharge Instructions: Apply over Kerlix as directed. WOUND #4: - Lower Leg Wound Laterality: Right, Anterior, Proximal Cleanser: Normal Saline (Home Health) 2 x Per Week/30 Days Discharge Instructions: Cleanse the wound with Normal Saline prior to applying a clean dressing using gauze sponges, not tissue or cotton balls. Peri-Wound Care: Sween Lotion (Moisturizing lotion) (Home Health) 2 x Per Week/30 Days Discharge Instructions: Apply moisturizing lotion or equivalent moisterizing lotion to legs under wraps Prim Dressing: IODOFLEX 0.9% Cadexomer Iodine Pad 4x6 cm (Home Health) 2 x Per  Week/30 Days ary Discharge Instructions: Apply to wound bed as instructed Secondary Dressing: Woven Gauze Sponge, Non-Sterile 4x4 in (Home Health) 2 x Per Week/30 Days Discharge Instructions: Apply over primary dressing as directed. Com pression Wrap: Kerlix Roll 4.5x3.1 (in/yd) 2 x Per Week/30  Days Discharge Instructions: Apply Kerlix and Coban compression , wrap from base of toes to knee Com pression Wrap: Coban Self-Adherent Wrap 4x5 (in/yd) 2 x Per Week/30 Days Discharge Instructions: Apply over Kerlix as directed. WOUND #5: - Lower Leg Wound Laterality: Left, Medial Cleanser: Normal Saline (Home Health) 2 x Per Week/30 Days Discharge Instructions: Cleanse the wound with Normal Saline prior to applying a clean dressing using gauze sponges, not tissue or cotton balls. Peri-Wound Care: Sween Lotion (Moisturizing lotion) (Home Health) 2 x Per Week/30 Days Discharge Instructions: Apply moisturizing lotion or equivalent moisterizing lotion to legs under wraps Prim Dressing: IODOFLEX 0.9% Cadexomer Iodine Pad 4x6 cm (Home Health) 2 x Per Week/30 Days ary Discharge Instructions: Apply to wound bed as instructed Secondary Dressing: Woven Gauze Sponge, Non-Sterile 4x4 in (Home Health) 2 x Per Week/30 Days Discharge Instructions: Apply over primary dressing as directed. Com pression Wrap: Kerlix Roll 4.5x3.1 (in/yd) 2 x Per Week/30 Days Discharge Instructions: Apply Kerlix and Coban compression , wrap from base of toes to knee Com pression Wrap: Coban Self-Adherent Wrap 4x5 (in/yd) 2 x Per Week/30 Days Discharge Instructions: Apply over Kerlix as directed. 1. I continued with Iodoflex to both wound areas 2. Kerlix and Coban bilaterally 3. We emphasized the home health that this goes on from the base of his toes to the tibial tuberosity. He had these halfway up his leg today Electronic Signature(s) Signed: 11/27/2020 5:12:12 PM By: Andrew Ham MD Entered By: Andrew [name]  15:07:27 -------------------------------------------------------------------------------- SuperBill Details Patient Name: Date of Service: Andrew [name], Andrew RGE A. 11/27/2020 Medical Record Number: [id number] Patient Account Number: [id number] Date of Birth/Sex: Treating RN: 1928-05-27 (86 y.o. Andrew [name] Primary Care Provider: Dimas [name] Other Clinician: Referring Provider: Treating Provider/Extender: Andrew [name] in Treatment: 11 Diagnosis Coding ICD-10 Codes Code Description E11.621 Type 2 diabetes mellitus with foot ulcer L97.422 Non-pressure chronic ulcer of left heel and midfoot with fat layer exposed I87.333 Chronic venous hypertension (idiopathic) with ulcer and inflammation of bilateral lower extremity L97.811 Non-pressure chronic ulcer of other part of right lower leg limited to breakdown of skin L97.818 Non-pressure chronic ulcer of other part of right lower leg with other specified severity L97.829 Non-pressure chronic ulcer of other part of left lower leg with unspecified severity T81.31XA Disruption of external operation (surgical) wound, not elsewhere classified, initial encounter Facility Procedures CPT4 Code: [id number] Description: [place] - DEB SUBQ TISSUE 20 SQ CM/< ICD-10 Diagnosis Description L97.422 Non-pressure chronic ulcer of left heel and midfoot with fat layer exposed L97.811 Non-pressure chronic ulcer of other part of right lower leg limited to breakdow  L97.829 Non-pressure chronic ulcer of other part of left lower leg with unspecified sev Modifier: n of skin erity Quantity: 1 Physician Procedures : CPT4 Code Description Modifier [id number] - WC PHYS SUBQ TISS 20 SQ CM ICD-10 Diagnosis Description L97.422 Non-pressure chronic ulcer of left heel and midfoot with fat layer exposed L97.811 Non-pressure chronic ulcer of other part of right lower  leg limited to breakdown of skin L97.829 Non-pressure chronic ulcer of other part of  left lower leg with unspecified severity Quantity: 1 Electronic Signature(s) Signed: 11/27/2020 5:12:12 PM By: Andrew Ham MD Entered By: Andrew [name] 15:07:41

## 2020-11-30 DIAGNOSIS — N184 Chronic kidney disease, stage 4 (severe): Secondary | ICD-10-CM | POA: Diagnosis not present

## 2020-11-30 DIAGNOSIS — L89620 Pressure ulcer of left heel, unstageable: Secondary | ICD-10-CM | POA: Diagnosis not present

## 2020-11-30 DIAGNOSIS — I872 Venous insufficiency (chronic) (peripheral): Secondary | ICD-10-CM | POA: Diagnosis not present

## 2020-11-30 DIAGNOSIS — I130 Hypertensive heart and chronic kidney disease with heart failure and stage 1 through stage 4 chronic kidney disease, or unspecified chronic kidney disease: Secondary | ICD-10-CM | POA: Diagnosis not present

## 2020-11-30 DIAGNOSIS — E1151 Type 2 diabetes mellitus with diabetic peripheral angiopathy without gangrene: Secondary | ICD-10-CM | POA: Diagnosis not present

## 2020-11-30 DIAGNOSIS — E1122 Type 2 diabetes mellitus with diabetic chronic kidney disease: Secondary | ICD-10-CM | POA: Diagnosis not present

## 2020-11-30 DIAGNOSIS — I5022 Chronic systolic (congestive) heart failure: Secondary | ICD-10-CM | POA: Diagnosis not present

## 2020-11-30 DIAGNOSIS — D631 Anemia in chronic kidney disease: Secondary | ICD-10-CM | POA: Diagnosis not present

## 2020-11-30 DIAGNOSIS — E1169 Type 2 diabetes mellitus with other specified complication: Secondary | ICD-10-CM | POA: Diagnosis not present

## 2020-11-30 NOTE — Progress Notes (Signed)
BENICIO, MANNA ([id number]) Visit Report for 11/27/2020 Arrival Information Details Patient Name: Date of Service: Andrew [name], Andrew RGE A. 11/27/2020 1:30 PM Medical Record Number: [id number] Patient Account Number: [id number] Date of Birth/Sex: Treating RN: 02-11-1928 (86 y.o. Burnadette Pop, Lauren Primary Care Russ Looper: Dimas Chyle Other Clinician: Referring Esteven Overfelt: Treating Rondell Pardon/Extender: Donalee Citrin in Treatment: 11 Visit Information History Since Last Visit Added or deleted any medications: No Patient Arrived: Wheel Chair Any new allergies or adverse reactions: No Arrival Time: 13:56 Had a fall or experienced change in No Accompanied By: wife activities of daily living that may affect Transfer Assistance: None risk of falls: Patient Identification Verified: Yes Signs or symptoms of abuse/neglect since last visito No Secondary Verification Process Completed: Yes Hospitalized since last visit: No Patient Requires Transmission-Based Precautions: No Implantable device outside of the clinic excluding No Patient Has Alerts: No cellular tissue based products placed in the center since last visit: Has Dressing in Place as Prescribed: Yes Has Compression in Place as Prescribed: Yes Pain Present Now: No Electronic Signature(s) Signed: 11/30/2020 5:19:12 PM By: Rhae Hammock RN Entered By: Rhae Hammock on 11/27/2020 13:56:51 -------------------------------------------------------------------------------- Encounter Discharge Information Details Patient Name: Date of Service: Andrew [name], Andrew RGE A. 11/27/2020 1:30 PM Medical Record Number: [id number] Patient Account Number: [id number] Date of Birth/Sex: Treating RN: [birth year] (86 y.o. [name] Primary Care Chassie Pennix: Dimas Chyle Other Clinician: Referring Asier Desroches: Treating Oralee Rapaport/Extender: Donalee Citrin in Treatment: 11 Encounter Discharge Information Items  Post Procedure Vitals Discharge Condition: Stable Temperature (F): 98.2 Ambulatory Status: Wheelchair Pulse (bpm): 64 Discharge Destination: Home Respiratory Rate (breaths/min): 18 Transportation: Private Auto Blood Pressure (mmHg): 112/66 Accompanied By: wife Schedule Follow-up Appointment: Yes Clinical Summary of Care: Electronic Signature(s) Signed: 11/27/2020 5:19:29 PM By: Deon Pilling Entered By: Deon Pilling on 11/27/2020 15:07:22 -------------------------------------------------------------------------------- Lower Extremity Assessment Details Patient Name: Date of Service: Andrew [name], Andrew RGE A. 11/27/2020 1:30 PM Medical Record Number: [id number] Patient Account Number: [id number] Date of Birth/Sex: Treating RN: 04-Jul-1928 (86 y.o. Burnadette Pop, Lauren Primary Care Juwuan Sedita: Dimas Chyle Other Clinician: Referring Tarah Buboltz: Treating Taydem Cavagnaro/Extender: Donalee Citrin in Treatment: 11 Edema Assessment Assessed: Shirlyn Goltz: Yes] Patrice Paradise: Yes] Edema: [Left: Yes] [Right: No] Calf Left: Right: Point of Measurement: 44 cm From Medial Instep 31 cm 34 cm Ankle Left: Right: Point of Measurement: 10 cm From Medial Instep 22 cm 23 cm Knee To Floor Left: Right: From Medial Instep 44 cm Vascular Assessment Pulses: Dorsalis Pedis Palpable: [Left:Yes] [Right:Yes] Posterior Tibial Palpable: [Left:Yes] [Right:Yes] Electronic Signature(s) Signed: 11/30/2020 5:19:12 PM By: Rhae Hammock RN Entered By: Rhae Hammock on 11/27/2020 14:04:59 -------------------------------------------------------------------------------- Multi Wound Chart Details Patient Name: Date of Service: Andrew [name], Andrew RGE A. 11/27/2020 1:30 PM Medical Record Number: [id number] Patient Account Number: [id number] Date of Birth/Sex: Treating RN: 1928/06/01 (86 y.o. [name] Primary Care Haille Pardi: Dimas Chyle Other Clinician: Referring Darreld Hoffer: Treating  Alexus Michael/Extender: Donalee Citrin in Treatment: 11 Vital Signs Height(in): 51 Pulse(bpm): 45 Weight(lbs): 185 Blood Pressure(mmHg): 112/66 Body Mass Index(BMI): 29 Temperature(F): 98.2 Respiratory Rate(breaths/min): 18 Photos: [2:No Photos Left Calcaneus] [4:No Photos Right, Proximal, Anterior Lower Leg] [5:No Photos Left, Medial Lower Leg] Wound Location: [2:Gradually Appeared] [4:Gradually Appeared] [5:Gradually Appeared] Wounding Event: [2:Diabetic Wound/Ulcer of the Lower] [4:Venous Leg Ulcer] [5:Diabetic Wound/Ulcer of the Lower] Primary Etiology: [2:Extremity Congestive Heart Failure, Coronary] [5:Extremity Congestive Heart Failure, Coronary] Comorbid History: [2:Artery Disease, Hypertension, Artery Disease, Hypertension, Peripheral Arterial Disease, Peripheral Venous Disease, Type II Diabetes  05/26/2020] [4:10/09/2020] [5:Artery Disease, Hypertension, Peripheral Arterial Disease, Peripheral  Venous Disease, Type II Diabetes 10/23/2020] Date Acquired: [2:11] [4:7] [5:5] Weeks of Treatment: [2:Open] [4:Open] [5:Open] Wound Status: [2:0.5x0.8x0.1] [4:2.3x1.7x0.1] [5:0.2x0.2x0.2] Measurements L x W x D (cm) [2:0.314] [4:3.071] [5:0.031] A (cm) : rea [2:0.031] [4:0.307] [5:0.006] Volume (cm) : [2:95.30%] [4:-3167.00%] [5:91.80%] % Reduction in A [2:rea: 95.40%] [4:-3311.10%] [5:84.20%] % Reduction in Volume: [2:Grade 2] [4:Full Thickness Without Exposed] [5:Grade 2] Classification: [2:Medium] [4:Support Structures Medium] [5:Medium] Exudate A mount: [2:Serosanguineous] [4:Serosanguineous] [5:Serosanguineous] Exudate Type: [2:red, brown] [4:red, brown] [5:red, brown] Exudate Color: [2:Flat and Intact] [4:Flat and Intact] [5:Distinct, outline attached] Wound Margin: [2:Large (67-100%)] [4:Medium (34-66%)] [5:Large (67-100%)] Granulation A mount: [2:Pink] [4:Red] [5:Red] Granulation Quality: [2:Small (1-33%)] [4:Medium (34-66%)] [5:None Present (0%)] Necrotic A  mount: [2:Fat Layer (Subcutaneous Tissue): Yes Fat Layer (Subcutaneous Tissue): Yes Fat Layer (Subcutaneous Tissue): Yes] Exposed Structures: [2:Fascia: No Tendon: No Muscle: No Joint: No Bone: No Medium (34-66%)] [4:Fascia: No Tendon: No Muscle: No Joint: No Bone: No Small (1-33%)] [5:Fascia: No Tendon: No Muscle: No Joint: No Bone: No Large (67-100%)] Epithelialization: [2:Debridement - Excisional] [4:Debridement - Excisional] [5:Debridement - Excisional] Debridement: Pre-procedure Verification/Time Out 14:30 [4:14:30] [5:14:30] Taken: [2:Other] [4:Other] [5:Other] Pain Control: [2:Subcutaneous, Slough] [4:Subcutaneous, Slough] [5:Subcutaneous, Slough] Tissue Debrided: [2:Skin/Subcutaneous Tissue] [4:Skin/Subcutaneous Tissue] [5:Skin/Subcutaneous Tissue] Level: [2:0.4] [4:3.91] [5:0.04] Debridement A (sq cm): [2:rea Curette] [4:Curette] [5:Curette] Instrument: [2:Minimum] [4:Minimum] [5:Minimum] Bleeding: [2:Pressure] [4:Pressure] [5:Pressure] Hemostasis A chieved: [2:5] [4:5] [5:5] Procedural Pain: [2:3] [4:3] [5:3] Post Procedural Pain: [2:Procedure was tolerated well] [4:Procedure was tolerated well] [5:Procedure was tolerated well] Debridement Treatment Response: [2:0.5x0.8x0.1] [4:2.3x1.7x0.1] [5:0.4x0.2x0.1] Post Debridement Measurements L x W x D (cm) [2:0.031] [4:0.307] [5:0.006] Post Debridement Volume: (cm) [2:Debridement] [4:Debridement] [5:Debridement] Treatment Notes Electronic Signature(s) Signed: 11/27/2020 5:01:53 PM By: Baruch Gouty RN, BSN Signed: 11/27/2020 5:12:12 PM By: Linton Ham MD Entered By: Linton Ham on 11/27/2020 15:01:09 -------------------------------------------------------------------------------- Multi-Disciplinary Care Plan Details Patient Name: Date of Service: Andrew [name], Andrew RGE A. 11/27/2020 1:30 PM Medical Record Number: [id number] Patient Account Number: [id number] Date of Birth/Sex: Treating RN: 08/02/1928 (86 y.o. [name] Primary Care Ryleigh Esqueda: Dimas Chyle Other Clinician: Referring Tyara Dassow: Treating Charnita Trudel/Extender: Donalee Citrin in Treatment: 11 Active Inactive Pain, Acute or Chronic Nursing Diagnoses: Pain, acute or chronic: actual or potential Goals: Patient/caregiver will verbalize adequate pain control between visits Date Initiated: 09/11/2020 Target Resolution Date: 12/18/2020 Goal Status: Active Interventions: Provide education on pain management Notes: Wound/Skin Impairment Nursing Diagnoses: Impaired tissue integrity Goals: Patient/caregiver will verbalize understanding of skin care regimen Date Initiated: 10/23/2020 Target Resolution Date: 12/18/2020 Goal Status: Active Ulcer/skin breakdown will have a volume reduction of 50% by week 8 Date Initiated: 09/11/2020 Date Inactivated: 10/23/2020 Target Resolution Date: 10/16/2020 Goal Status: Unmet Unmet Reason: PAD Interventions: Provide education on ulcer and skin care Notes: Electronic Signature(s) Signed: 11/27/2020 5:01:53 PM By: Baruch Gouty RN, BSN Entered By: Baruch Gouty on 11/27/2020 13:57:23 -------------------------------------------------------------------------------- Pain Assessment Details Patient Name: Date of Service: Andrew [name], Andrew RGE A. 11/27/2020 1:30 PM Medical Record Number: [id number] Patient Account Number: [id number] Date of Birth/Sex: Treating RN: 03-26-1928 (86 y.o. Erie Noe Primary Care Capricia Serda: Dimas Chyle Other Clinician: Referring Raia Amico: Treating Cagney Steenson/Extender: Donalee Citrin in Treatment: 11 Active Problems Location of Pain Severity and Description of Pain Patient Has Paino No Site Locations Rate the pain. Current Pain Level: 0 Pain Management and Medication Current Pain Management: Electronic Signature(s) Signed: 11/30/2020 5:19:12 PM By: Rhae Hammock RN Entered By: Rhae Hammock  on 11/27/2020  13:59:10 -------------------------------------------------------------------------------- Patient/Caregiver Education Details Patient Name: Date of Service: Andrew [name], Andrew [facility] A. 12/3/2021andnbsp1:30 PM Medical Record Number: [id number] Patient Account Number: [id number] Date of Birth/Gender: Treating RN: 02-05-1928 (86 y.o. [name] Primary Care Physician: Dimas Chyle Other Clinician: Referring Physician: Treating Physician/Extender: Donalee Citrin in Treatment: 11 Education Assessment Education Provided To: Patient Education Topics Provided Wound/Skin Impairment: Methods: Explain/Verbal Responses: Reinforcements needed, State content correctly [employer]) Signed: 11/27/2020 5:01:53 PM By: Baruch Gouty RN, BSN Entered By: Baruch Gouty on 11/27/2020 13:58:51 -------------------------------------------------------------------------------- Wound Assessment Details Patient Name: Date of Service: Andrew [name], Andrew RGE A. 11/27/2020 1:30 PM Medical Record Number: [id number] Patient Account Number: [id number] Date of Birth/Sex: Treating RN: [birth year] (86 y.o. Burnadette Pop, Lauren Primary Care Adabelle Griffiths: Dimas Chyle Other Clinician: Referring Deaglan Lile: Treating Margaret Staggs/Extender: Donalee Citrin in Treatment: 11 Wound Status Wound Number: 2 Primary Diabetic Wound/Ulcer of the Lower Extremity Etiology: Wound Location: Left Calcaneus Wound Open Wounding Event: Gradually Appeared Status: Date Acquired: 05/26/2020 Comorbid Congestive Heart Failure, Coronary Artery Disease, Hypertension, Weeks Of Treatment: 11 History: Peripheral Arterial Disease, Peripheral Venous Disease, Type II Clustered Wound: No Diabetes Wound Measurements Length: (cm) 0.5 Width: (cm) 0.8 Depth: (cm) 0.1 Area: (cm) 0.314 Volume: (cm) 0.031 % Reduction in Area: 95.3% % Reduction in Volume: 95.4% Epithelialization: Medium  (34-66%) Tunneling: No Undermining: No Wound Description Classification: Grade 2 Wound Margin: Flat and Intact Exudate Amount: Medium Exudate Type: Serosanguineous Exudate Color: red, brown Foul Odor After Cleansing: No Slough/Fibrino Yes Wound Bed Granulation Amount: Large (67-100%) Exposed Structure Granulation Quality: Pink Fascia Exposed: No Necrotic Amount: Small (1-33%) Fat Layer (Subcutaneous Tissue) Exposed: Yes Necrotic Quality: Adherent Slough Tendon Exposed: No Muscle Exposed: No Joint Exposed: No Bone Exposed: No Treatment Notes Wound #2 (Calcaneus) Wound Laterality: Left Cleanser Normal Saline Discharge Instruction: Cleanse the wound with Normal Saline prior to applying a clean dressing using gauze sponges, not tissue or cotton balls. Peri-Wound Care Sween Lotion (Moisturizing lotion) Discharge Instruction: Apply moisturizing lotion or equivalent moisterizing lotion to legs under wraps Topical Primary Dressing IODOFLEX 0.9% Cadexomer Iodine Pad 4x6 cm Discharge Instruction: Apply to wound bed as instructed Secondary Dressing Woven Gauze Sponge, Non-Sterile 4x4 in Discharge Instruction: Apply over primary dressing as directed. ALLEVYN Heel 4 1/2in x 5 1/2in / 10.5cm x 13.5cm Discharge Instruction: Apply over primary dressing as directed. Secured With Compression Wrap Kerlix Roll 4.5x3.1 (in/yd) Discharge Instruction: Apply Kerlix and Coban compression , wrap from base of toes to knee Coban Self-Adherent Wrap 4x5 (in/yd) Discharge Instruction: Apply over Kerlix as directed. Compression Stockings Add-Ons Electronic Signature(s) Signed: 11/30/2020 5:19:12 PM By: Rhae Hammock RN Entered By: Rhae Hammock on 11/27/2020 14:13:00 -------------------------------------------------------------------------------- Wound Assessment Details Patient Name: Date of Service: Andrew [name], Andrew RGE A. 11/27/2020 1:30 PM Medical Record Number: [id number] Patient  Account Number: [id number] Date of Birth/Sex: Treating RN: 1928/03/29 (86 y.o. Burnadette Pop, Lauren Primary Care Maurene Hollin: Dimas Chyle Other Clinician: Referring Codi Folkerts: Treating Carrington Mullenax/Extender: Donalee Citrin in Treatment: 11 Wound Status Wound Number: 4 Primary Venous Leg Ulcer Etiology: Wound Location: Right, Proximal, Anterior Lower Leg Wound Open Wounding Event: Gradually Appeared Status: Date Acquired: 10/09/2020 Comorbid Congestive Heart Failure, Coronary Artery Disease, Hypertension, Comorbid Congestive Heart Failure, Coronary Artery Disease, Hypertension, Weeks Of Treatment: 7 History: Peripheral Arterial Disease, Peripheral Venous Disease, Type II Clustered Wound: No Diabetes Wound Measurements Length: (cm) 2.3 Width: (cm) 1.7 Depth: (cm) 0.1 Area: (cm) 3.071 Volume: (cm) 0.307 % Reduction in  Area: -3167% % Reduction in Volume: -3311.1% Epithelialization: Small (1-33%) Tunneling: No Undermining: No Wound Description Classification: Full Thickness Without Exposed Support Structures Wound Margin: Flat and Intact Exudate Amount: Medium Exudate Type: Serosanguineous Exudate Color: red, brown Foul Odor After Cleansing: No Slough/Fibrino Yes Wound Bed Granulation Amount: Medium (34-66%) Exposed Structure Granulation Quality: Red Fascia Exposed: No Necrotic Amount: Medium (34-66%) Fat Layer (Subcutaneous Tissue) Exposed: Yes Necrotic Quality: Adherent Slough Tendon Exposed: No Muscle Exposed: No Joint Exposed: No Bone Exposed: No Treatment Notes Wound #4 (Lower Leg) Wound Laterality: Right, Anterior, Proximal Cleanser Normal Saline Discharge Instruction: Cleanse the wound with Normal Saline prior to applying a clean dressing using gauze sponges, not tissue or cotton balls. Peri-Wound Care Sween Lotion (Moisturizing lotion) Discharge Instruction: Apply moisturizing lotion or equivalent moisterizing lotion to legs under  wraps Topical Primary Dressing IODOFLEX 0.9% Cadexomer Iodine Pad 4x6 cm Discharge Instruction: Apply to wound bed as instructed Secondary Dressing Woven Gauze Sponge, Non-Sterile 4x4 in Discharge Instruction: Apply over primary dressing as directed. Secured With Compression Wrap Kerlix Roll 4.5x3.1 (in/yd) Discharge Instruction: Apply Kerlix and Coban compression , wrap from base of toes to knee Coban Self-Adherent Wrap 4x5 (in/yd) Discharge Instruction: Apply over Kerlix as directed. Compression Stockings Add-Ons Electronic Signature(s) Signed: 11/30/2020 5:19:12 PM By: Rhae Hammock RN Entered By: Rhae Hammock on 11/27/2020 14:15:06 -------------------------------------------------------------------------------- Wound Assessment Details Patient Name: Date of Service: Andrew [name], Andrew RGE A. 11/27/2020 1:30 PM Medical Record Number: [id number] Patient Account Number: [id number] Date of Birth/Sex: Treating RN: 1928/10/25 (86 y.o. Burnadette Pop, Lauren Primary Care Jendayi Berling: Dimas Chyle Other Clinician: Referring Keylen Eckenrode: Treating Endora Teresi/Extender: Donalee Citrin in Treatment: 11 Wound Status Wound Number: 5 Primary Diabetic Wound/Ulcer of the Lower Extremity Etiology: Wound Location: Left, Medial Lower Leg Wound Open Wounding Event: Gradually Appeared Status: Date Acquired: 10/23/2020 Comorbid Congestive Heart Failure, Coronary Artery Disease, Hypertension, Weeks Of Treatment: 5 History: Peripheral Arterial Disease, Peripheral Venous Disease, Type II Clustered Wound: No Diabetes Wound Measurements Length: (cm) 0.2 Width: (cm) 0.2 Depth: (cm) 0.2 Area: (cm) 0.031 Volume: (cm) 0.006 % Reduction in Area: 91.8% % Reduction in Volume: 84.2% Epithelialization: Large (67-100%) Tunneling: No Undermining: No Wound Description Classification: Grade 2 Wound Margin: Distinct, outline attached Exudate Amount: Medium Exudate Type:  Serosanguineous Exudate Color: red, brown Foul Odor After Cleansing: No Slough/Fibrino Yes Wound Bed Granulation Amount: Large (67-100%) Exposed Structure Granulation Quality: Red Fascia Exposed: No Necrotic Amount: None Present (0%) Fat Layer (Subcutaneous Tissue) Exposed: Yes Tendon Exposed: No Muscle Exposed: No Joint Exposed: No Bone Exposed: No Treatment Notes Wound #5 (Lower Leg) Wound Laterality: Left, Medial Cleanser Normal Saline Discharge Instruction: Cleanse the wound with Normal Saline prior to applying a clean dressing using gauze sponges, not tissue or cotton balls. Peri-Wound Care Sween Lotion (Moisturizing lotion) Discharge Instruction: Apply moisturizing lotion or equivalent moisterizing lotion to legs under wraps Topical Primary Dressing IODOFLEX 0.9% Cadexomer Iodine Pad 4x6 cm Discharge Instruction: Apply to wound bed as instructed Secondary Dressing Woven Gauze Sponge, Non-Sterile 4x4 in Discharge Instruction: Apply over primary dressing as directed. Secured With Compression Wrap Kerlix Roll 4.5x3.1 (in/yd) Discharge Instruction: Apply Kerlix and Coban compression , wrap from base of toes to knee Coban Self-Adherent Wrap 4x5 (in/yd) Discharge Instruction: Apply over Kerlix as directed. Compression Stockings Add-Ons Electronic Signature(s) Signed: 11/30/2020 5:19:12 PM By: Rhae Hammock RN Signed: 11/30/2020 5:19:12 PM By: Rhae Hammock RN Entered By: Rhae Hammock on 11/27/2020 14:13:45 -------------------------------------------------------------------------------- Vitals Details Patient Name: Date of Service: Andrew [name], Andrew  RGE A. 11/27/2020 1:30 PM Medical Record Number: [id number] Patient Account Number: [id number] Date of Birth/Sex: Treating RN: [birth year] (86 y.o. Burnadette Pop, Lauren Primary Care Solly Derasmo: Dimas Chyle Other Clinician: Referring Harlan Vinal: Treating Rollins Wrightson/Extender: Donalee Citrin in  Treatment: 11 Vital Signs Time Taken: 13:58 Temperature (F): 98.2 Height (in): 67 Pulse (bpm): 64 Weight (lbs): 185 Respiratory Rate (breaths/min): 18 Body Mass Index (BMI): 29 Blood Pressure (mmHg): 112/66 Reference Range: 80 - 120 mg / dl Electronic Signature(s) Signed: 11/30/2020 5:19:12 PM By: Rhae Hammock RN Entered By: Rhae Hammock on 11/27/2020 13:59:01

## 2020-12-03 ENCOUNTER — Other Ambulatory Visit (HOSPITAL_COMMUNITY): Payer: Self-pay

## 2020-12-03 NOTE — Progress Notes (Signed)
Paramedicine Encounter    Patient ID: KALLEN MCCRYSTAL, male    DOB: 16-Jun-1928, 86 y.o.   MRN: [id number]   Patient Care Team: [name], MD as PCP - General (Family Medicine) Larey Dresser, MD as PCP - Advanced Heart Failure (Cardiology) Larey Dresser, MD as Consulting Physician (Cardiology) Jorge Ny, LCSW as Social Worker (Licensed Clinical Social Worker) Gardiner Barefoot, DPM as Consulting Physician (Podiatry) [name], MD (Family Medicine) Patient, No Pcp Per (General Practice)  Patient Active Problem List   Diagnosis Date Noted  . Acute respiratory failure with hypercapnia ([place]) 06/02/2020  . Acute metabolic encephalopathy 27/02/5008  . CKD (chronic kidney disease), stage IV ([place]) 06/02/2020  . PAF (paroxysmal atrial fibrillation) ([place]) 06/02/2020  . Hypothyroidism 06/02/2020  . Chronic anticoagulation 06/02/2020  . Debility 06/01/2020  . Lumbar spondylosis 10/04/2018  . Chronic respiratory failure with hypoxia ([place]) 09/16/2018  . Pain of right thumb 06/19/2018  . Bilateral recurrent inguinal hernias 11/28/2017  . Carotid artery stenosis 01/12/2016  . Chronic systolic CHF (congestive heart failure) ([place]) 12/02/2015  . 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 ([place]) 10/16/2015  . PVD (peripheral vascular disease) ([place])   . Hereditary and idiopathic peripheral neuropathy 10/02/2015  . Peripelvic (lymphatic) cyst   . Pernicious anemia 07/15/2015  . Bilateral renal cysts 07/12/2015  . Lung nodule, 30mm RML CT 07/12/15 07/12/2015  . Stage 3 chronic kidney disease ([place]) 06/14/2015  . Paroxysmal atrial fibrillation ([place]) 05/28/2015  . AAA (abdominal aortic aneurysm) without rupture ([place]) 09/30/2014  . Cardiomyopathy, ischemic 05/23/2014  . DM (diabetes mellitus), type 2 with renal complications ([place]) 38/18/2993  . Depression, major, single episode, complete remission ([place]) 03/18/2010  . Hypothyroidism 02/03/2009  . Osteoarthritis 05/27/2008  .  Hyperlipidemia associated with type 2 diabetes mellitus ([place]) 06/14/2007  . Hypertension associated with diabetes ([place]) 06/14/2007  . GERD 06/14/2007    Current Outpatient Medications:  .  acetaminophen (TYLENOL) 500 MG tablet, Take 2 tablets (1,000 mg total) by mouth every 8 (eight) hours as needed., Disp: 30 tablet, Rfl: 0 .  amiodarone (PACERONE) 200 MG tablet, Take 0.5 tablets (100 mg total) by mouth daily., Disp: 45 tablet, Rfl: 3 .  apixaban (ELIQUIS) 2.5 MG TABS tablet, Take 2.5 mg by mouth 2 (two) times daily. , Disp: , Rfl:  .  atorvastatin (LIPITOR) 80 MG tablet, Take 1 tablet (80 mg total) by mouth daily., Disp: 30 tablet, Rfl: 3 .  carvedilol (COREG) 12.5 MG tablet, Take 12.5 mg by mouth 2 (two) times daily with a meal., Disp: , Rfl:  .  cilostazol (PLETAL) 100 MG tablet, Take 1 tablet (100 mg total) by mouth 2 (two) times daily., Disp: 180 tablet, Rfl: 3 .  ezetimibe (ZETIA) 10 MG tablet, TAKE (1) TABLET DAILY., Disp: 90 tablet, Rfl: 0 .  FLUoxetine (PROZAC) 20 MG capsule, TAKE (1) CAPSULE DAILY., Disp: 90 capsule, Rfl: 0 .  gabapentin (NEURONTIN) 100 MG capsule, Take 100 mg by mouth 3 (three) times daily., Disp: , Rfl:  .  hydrALAZINE (APRESOLINE) 50 MG tablet, Take 1 tablet (50 mg total) by mouth 3 (three) times daily., Disp: 90 tablet, Rfl: 3 .  isosorbide mononitrate (IMDUR) 30 MG 24 hr tablet, Take 1 tablet (30 mg total) by mouth daily., Disp: 30 tablet, Rfl: 3 .  levothyroxine (SYNTHROID) 100 MCG tablet, TAKE 1 TABLET BEFORE BREAKFAST., Disp: 90 tablet, Rfl: 0 .  Multiple Vitamins-Minerals (MULTIVITAMIN ADULTS 50+) TABS, Take 1 tablet by mouth daily.,  Disp: , Rfl:  .  pantoprazole (PROTONIX) 40 MG tablet, Take 1 tablet (40 mg total) by mouth 2 (two) times daily., Disp: , Rfl:  .  potassium chloride SA (KLOR-CON) 20 MEQ tablet, Take 20 mEq by mouth daily., Disp: , Rfl:  .  tiotropium (SPIRIVA) 18 MCG inhalation capsule, Place 18 mcg into inhaler and inhale daily as needed (for  wheezing)., Disp: , Rfl:  .  torsemide (DEMADEX) 20 MG tablet, Take 4 tablets (80 mg total) by mouth 2 (two) times daily., Disp: 240 tablet, Rfl: 3 .  glucose blood (ACCU-CHEK AVIVA PLUS) test strip, CHECK BLOOD SUGAR 4 TIMES A DAY., Disp: 100 each, Rfl: 11 .  HYDROcodone-acetaminophen (NORCO/VICODIN) 5-325 MG tablet, TAKE 1 TABLET EVERY 6 HOURS AS NEEDED FOR MODERATE PAIN. (Patient not taking: No sig reported), Disp: 12 tablet, Rfl: 0 .  metolazone (ZAROXOLYN) 2.5 MG tablet, Take 1 tablet (2.5 mg total) by mouth as directed. By [facility] (Patient not taking: No sig reported), Disp: 3 tablet, Rfl: 0 .  nitroGLYCERIN (NITROSTAT) 0.4 MG SL tablet, DISSOLVE 1 TABLET UNDER TONGUE AS NEEDED FOR CHEST PAIN,MAY REPEAT IN5 MINUTES FOR 2 DOSES. (Patient not taking: No sig reported), Disp: 25 tablet, Rfl: 3 Allergies  Allergen Reactions  . Metoprolol Shortness Of Breath, Palpitations and Other (See Comments)    Heart starts racing. Shallow breathing; pt states he also get skin irritation on his legs  . Metformin Hives and Swelling    On legs  . Metformin And Related Nausea And Vomiting      Social History   Socioeconomic History  . Marital status: Married    Spouse name: Not on file  . Number of children: Not on file  . Years of education: Not on file  . Highest education level: Not on file  Occupational History  . Occupation: Retired  Tobacco Use  . Smoking status: Never Smoker  . Smokeless tobacco: Never Used  . Tobacco comment: quit atleast 25 yrs ago, per pt  Vaping Use  . Vaping Use: Never used  Substance and Sexual Activity  . Alcohol use: Never    Comment: socially  . Drug use: Never  . Sexual activity: Not Currently  Other Topics Concern  . Not on file  Social History Narrative   ** Merged History Encounter **       Pt lives in [place] with spouse. Retired from [employer].  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   Social  Determinants of Health   Financial Resource Strain: Not on file  Food Insecurity: Not on file  Transportation Needs: Not on file  Physical Activity: Not on file  Stress: Not on file  Social Connections: Not on file  Intimate Partner Violence: Not on file    Physical Exam Cardiovascular:     Rate and Rhythm: Normal rate and regular rhythm.     Pulses: Normal pulses.  Pulmonary:     Effort: Pulmonary effort is normal.     Breath sounds: Normal breath sounds.  Musculoskeletal:        General: Normal range of motion.     Right lower leg: No edema.     Left lower leg: No edema.  Skin:    General: Skin is warm and dry.     Capillary Refill: Capillary refill takes less than 2 seconds.  Neurological:     Mental Status: He is alert and oriented to person, place, and time.  Psychiatric:  Mood and Affect: Mood normal.         Future Appointments  Date Time Provider [place]  12/04/2020 11:00 AM [facility] HEALTH COACH [facility]  12/11/2020  1:30 PM Ricard Dillon, MD [facility]  01/14/2021  1:40 PM Larey Dresser, MD [facility] None    BP (!) 107/53 (BP Location: Left Arm, Patient Position: Sitting, Cuff Size: Normal)   Pulse 62   Resp 16   Wt 174 lb 12.8 oz (79.3 kg)   SpO2 93%   BMI 27.38 kg/m   Weight yesterday- did not weigh Last visit weight- 173 lb  Mr Pickering was seen at home today and reported feeling well. He denied chest pain, SOB, headache, dizziness, orthopnea, fever or cough over the past week. He state he has been compliant with his medications over the past week and his weight remains stable. His medications were verified and his pillbox was refilled. I will follow up next week.   [name], EMT 12/03/20  ACTION: Home visit completed Next visit planned for 1 week

## 2020-12-04 ENCOUNTER — Ambulatory Visit (INDEPENDENT_AMBULATORY_CARE_PROVIDER_SITE_OTHER): Payer: Medicare PPO

## 2020-12-04 DIAGNOSIS — I130 Hypertensive heart and chronic kidney disease with heart failure and stage 1 through stage 4 chronic kidney disease, or unspecified chronic kidney disease: Secondary | ICD-10-CM | POA: Diagnosis not present

## 2020-12-04 DIAGNOSIS — L89620 Pressure ulcer of left heel, unstageable: Secondary | ICD-10-CM | POA: Diagnosis not present

## 2020-12-04 DIAGNOSIS — D631 Anemia in chronic kidney disease: Secondary | ICD-10-CM | POA: Diagnosis not present

## 2020-12-04 DIAGNOSIS — E1169 Type 2 diabetes mellitus with other specified complication: Secondary | ICD-10-CM | POA: Diagnosis not present

## 2020-12-04 DIAGNOSIS — E1122 Type 2 diabetes mellitus with diabetic chronic kidney disease: Secondary | ICD-10-CM | POA: Diagnosis not present

## 2020-12-04 DIAGNOSIS — Z0000 Encounter for general adult medical examination without abnormal findings: Secondary | ICD-10-CM

## 2020-12-04 DIAGNOSIS — I872 Venous insufficiency (chronic) (peripheral): Secondary | ICD-10-CM | POA: Diagnosis not present

## 2020-12-04 DIAGNOSIS — E1151 Type 2 diabetes mellitus with diabetic peripheral angiopathy without gangrene: Secondary | ICD-10-CM | POA: Diagnosis not present

## 2020-12-04 DIAGNOSIS — N184 Chronic kidney disease, stage 4 (severe): Secondary | ICD-10-CM | POA: Diagnosis not present

## 2020-12-04 DIAGNOSIS — I5022 Chronic systolic (congestive) heart failure: Secondary | ICD-10-CM | POA: Diagnosis not present

## 2020-12-04 NOTE — Patient Instructions (Addendum)
[name] , Thank you for taking time to come for your Medicare Wellness Visit. I appreciate your ongoing commitment to your health goals. Please review the following plan we discussed and let me know if I can assist you in the future.   Screening recommendations/referrals: Colonoscopy: Done 06/10/20 Recommended yearly ophthalmology/optometry visit for glaucoma screening and checkup Recommended yearly dental visit for hygiene and checkup  Vaccinations: Influenza vaccine: Due and discussed Pneumococcal vaccine: Up to date Tdap vaccine: Up to date Shingles vaccine: Shingrix discussed. Please contact your pharmacy for coverage information.    Covid-19: Completed 1/19 & 02/03/20  Advanced directives: Address at next visit  Conditions/risks identified: None at this time  Next appointment: Follow up in one year for your annual wellness visit.   Preventive Care 84 Years and Older, Male Preventive care refers to lifestyle choices and visits with your health care provider that can promote health and wellness. What does preventive care include?  A yearly physical exam. This is also called an annual well check.  Dental exams once or twice a year.  Routine eye exams. Ask your health care provider how often you should have your eyes checked.  Personal lifestyle choices, including:  Daily care of your teeth and gums.  Regular physical activity.  Eating a healthy diet.  Avoiding tobacco and drug use.  Limiting alcohol use.  Practicing safe sex.  Taking low doses of aspirin every day.  Taking vitamin and mineral supplements as recommended by your health care provider. What happens during an annual well check? The services and screenings done by your health care provider during your annual well check will depend on your age, overall health, lifestyle risk factors, and family history of disease. Counseling  Your health care provider may ask you questions about your:  Alcohol  use.  Tobacco use.  Drug use.  Emotional well-being.  Home and relationship well-being.  Sexual activity.  Eating habits.  History of falls.  Memory and ability to understand (cognition).  Work and work Statistician. Screening  You may have the following tests or measurements:  Height, weight, and BMI.  Blood pressure.  Lipid and cholesterol levels. These may be checked every 5 years, or more frequently if you are over 4 years old.  Skin check.  Lung cancer screening. You may have this screening every year starting at age 52 if you have a 30-pack-year history of smoking and currently smoke or have quit within the past 15 years.  Fecal occult blood test (FOBT) of the stool. You may have this test every year starting at age 59.  Flexible sigmoidoscopy or colonoscopy. You may have a sigmoidoscopy every 5 years or a colonoscopy every 10 years starting at age 29.  Prostate cancer screening. Recommendations will vary depending on your family history and other risks.  Hepatitis C blood test.  Hepatitis B blood test.  Sexually transmitted disease (STD) testing.  Diabetes screening. This is done by checking your blood sugar (glucose) after you have not eaten for a while (fasting). You may have this done every 1-3 years.  Abdominal aortic aneurysm (AAA) screening. You may need this if you are a current or former smoker.  Osteoporosis. You may be screened starting at age 78 if you are at high risk. Talk with your health care provider about your test results, treatment options, and if necessary, the need for more tests. Vaccines  Your health care provider may recommend certain vaccines, such as:  Influenza vaccine. This is recommended every  year.  Tetanus, diphtheria, and acellular pertussis (Tdap, Td) vaccine. You may need a Td booster every 10 years.  Zoster vaccine. You may need this after age 42.  Pneumococcal 13-valent conjugate (PCV13) vaccine. One dose is  recommended after age 19.  Pneumococcal polysaccharide (PPSV23) vaccine. One dose is recommended after age 72. Talk to your health care provider about which screenings and vaccines you need and how often you need them. This information is not intended to replace advice given to you by your health care provider. Make sure you discuss any questions you have with your health care provider. Document Released: 01/08/2016 Document Revised: 08/31/2016 Document Reviewed: 10/13/2015 Elsevier Interactive Patient Education  2017 [place] Prevention in the Home Falls can cause injuries. They can happen to people of all ages. There are many things you can do to make your home safe and to help prevent falls. What can I do on the outside of my home?  Regularly fix the edges of walkways and driveways and fix any cracks.  Remove anything that might make you trip as you walk through a door, such as a raised step or threshold.  Trim any bushes or trees on the path to your home.  Use bright outdoor lighting.  Clear any walking paths of anything that might make someone trip, such as rocks or tools.  Regularly check to see if handrails are loose or broken. Make sure that both sides of any steps have handrails.  Any raised decks and porches should have guardrails on the edges.  Have any leaves, snow, or ice cleared regularly.  Use sand or salt on walking paths during winter.  Clean up any spills in your garage right away. This includes oil or grease spills. What can I do in the bathroom?  Use night lights.  Install grab bars by the toilet and in the tub and shower. Do not use towel bars as grab bars.  Use non-skid mats or decals in the tub or shower.  If you need to sit down in the shower, use a plastic, non-slip stool.  Keep the floor dry. Clean up any water that spills on the floor as soon as it happens.  Remove soap buildup in the tub or shower regularly.  Attach bath mats  securely with double-sided non-slip rug tape.  Do not have throw rugs and other things on the floor that can make you trip. What can I do in the bedroom?  Use night lights.  Make sure that you have a light by your bed that is easy to reach.  Do not use any sheets or blankets that are too big for your bed. They should not hang down onto the floor.  Have a firm chair that has side arms. You can use this for support while you get dressed.  Do not have throw rugs and other things on the floor that can make you trip. What can I do in the kitchen?  Clean up any spills right away.  Avoid walking on wet floors.  Keep items that you use a lot in easy-to-reach places.  If you need to reach something above you, use a strong step stool that has a grab bar.  Keep electrical cords out of the way.  Do not use floor polish or wax that makes floors slippery. If you must use wax, use non-skid floor wax.  Do not have throw rugs and other things on the floor that can make you trip. What can  I do with my stairs?  Do not leave any items on the stairs.  Make sure that there are handrails on both sides of the stairs and use them. Fix handrails that are broken or loose. Make sure that handrails are as long as the stairways.  Check any carpeting to make sure that it is firmly attached to the stairs. Fix any carpet that is loose or worn.  Avoid having throw rugs at the top or bottom of the stairs. If you do have throw rugs, attach them to the floor with carpet tape.  Make sure that you have a light switch at the top of the stairs and the bottom of the stairs. If you do not have them, ask someone to add them for you. What else can I do to help prevent falls?  Wear shoes that:  Do not have high heels.  Have rubber bottoms.  Are comfortable and fit you well.  Are closed at the toe. Do not wear sandals.  If you use a stepladder:  Make sure that it is fully opened. Do not climb a closed  stepladder.  Make sure that both sides of the stepladder are locked into place.  Ask someone to hold it for you, if possible.  Clearly mark and make sure that you can see:  Any grab bars or handrails.  First and last steps.  Where the edge of each step is.  Use tools that help you move around (mobility aids) if they are needed. These include:  Canes.  Walkers.  Scooters.  Crutches.  Turn on the lights when you go into a dark area. Replace any light bulbs as soon as they burn out.  Set up your furniture so you have a clear path. Avoid moving your furniture around.  If any of your floors are uneven, fix them.  If there are any pets around you, be aware of where they are.  Review your medicines with your doctor. Some medicines can make you feel dizzy. This can increase your chance of falling. Ask your doctor what other things that you can do to help prevent falls. This information is not intended to replace advice given to you by your health care provider. Make sure you discuss any questions you have with your health care provider. Document Released: 10/08/2009 Document Revised: 05/19/2016 Document Reviewed: 01/16/2015 Elsevier Interactive Patient Education  2017 [employer].

## 2020-12-04 NOTE — Progress Notes (Signed)
Virtual Visit via Telephone Note  I connected with  Andrew [name] on 12/04/20 at 11:00 AM EST by telephone and verified that I am speaking with the correct person using two identifiers.  Medicare Annual Wellness visit completed telephonically due to Covid-19 pandemic.   Persons participating in this call: This Health Coach and this patient.   Location: Patient: Home Provider: Office    I discussed the limitations, risks, security and privacy concerns of performing an evaluation and management service by telephone and the availability of in person appointments. The patient expressed understanding and agreed to proceed.  Unable to perform video visit due to video visit attempted and failed and/or patient does not have video capability.   Some vital signs may be absent or patient reported.   Willette Brace, LPN    Subjective:   Andrew [name] is a 86 y.o. male who presents for Medicare Annual/Subsequent preventive examination.  Review of Systems     Cardiac Risk Factors include: advanced age (>71men, >25 women);diabetes mellitus;dyslipidemia;hypertension;male gender     Objective:    There were no vitals filed for this visit. There is no height or weight on file to calculate BMI.  Advanced Directives 08/10/2020 07/03/2020 06/11/2020 05/28/2020 10/31/2019 07/02/2019 09/16/2018  Does Patient Have a Medical Advance Directive? Yes Unable to assess, patient is non-responsive or altered mental status No No Yes No No  Type of Advance Directive - - - Public librarian;Living will - -  Does patient want to make changes to medical advance directive? No - Patient declined - - - No - Patient declined - -  Copy of [place] in Chart? - - - - No - copy requested - -  Would patient like information on creating a medical advance directive? - - No - Patient declined - - No - Patient declined No - Patient declined  Pre-existing out of facility DNR order (yellow form  or pink MOST form) - - - - - - -    Current Medications (verified) Outpatient Encounter Medications as of 12/04/2020  Medication Sig  . acetaminophen (TYLENOL) 500 MG tablet Take 2 tablets (1,000 mg total) by mouth every 8 (eight) hours as needed.  Andrew [name] Kitchen amiodarone (PACERONE) 200 MG tablet Take 0.5 tablets (100 mg total) by mouth daily.  Andrew [name] Kitchen apixaban (ELIQUIS) 2.5 MG TABS tablet Take 2.5 mg by mouth 2 (two) times daily.   Andrew [name] Kitchen atorvastatin (LIPITOR) 80 MG tablet Take 1 tablet (80 mg total) by mouth daily.  . carvedilol (COREG) 12.5 MG tablet Take 12.5 mg by mouth 2 (two) times daily with a meal.  . cilostazol (PLETAL) 100 MG tablet Take 1 tablet (100 mg total) by mouth 2 (two) times daily.  Andrew [name] Kitchen ezetimibe (ZETIA) 10 MG tablet TAKE (1) TABLET DAILY.  Andrew [name] Kitchen FLUoxetine (PROZAC) 20 MG capsule TAKE (1) CAPSULE DAILY.  Andrew [name] Kitchen gabapentin (NEURONTIN) 100 MG capsule Take 100 mg by mouth 3 (three) times daily.  Andrew [name] Kitchen glucose blood (ACCU-CHEK AVIVA PLUS) test strip CHECK BLOOD SUGAR 4 TIMES A DAY.  . hydrALAZINE (APRESOLINE) 50 MG tablet Take 1 tablet (50 mg total) by mouth 3 (three) times daily.  . isosorbide mononitrate (IMDUR) 30 MG 24 hr tablet Take 1 tablet (30 mg total) by mouth daily.  Andrew [name] Kitchen levothyroxine (SYNTHROID) 100 MCG tablet TAKE 1 TABLET BEFORE BREAKFAST.  . Multiple Vitamins-Minerals (MULTIVITAMIN ADULTS 50+) TABS Take 1 tablet by mouth daily.  . pantoprazole (PROTONIX) 40 MG tablet Take 1 tablet (40 mg  total) by mouth 2 (two) times daily.  . potassium chloride SA (KLOR-CON) 20 MEQ tablet Take 20 mEq by mouth daily.  Andrew [name] Kitchen tiotropium (SPIRIVA) 18 MCG inhalation capsule Place 18 mcg into inhaler and inhale daily as needed (for wheezing).  . torsemide (DEMADEX) 20 MG tablet Take 4 tablets (80 mg total) by mouth 2 (two) times daily.  Andrew [name] Kitchen HYDROcodone-acetaminophen (NORCO/VICODIN) 5-325 MG tablet TAKE 1 TABLET EVERY 6 HOURS AS NEEDED FOR MODERATE PAIN. (Patient not taking: No sig reported)  . metolazone (ZAROXOLYN) 2.5 MG  tablet Take 1 tablet (2.5 mg total) by mouth as directed. By [facility] Clinic (Patient not taking: Reported on 12/04/2020)  . nitroGLYCERIN (NITROSTAT) 0.4 MG SL tablet DISSOLVE 1 TABLET UNDER TONGUE AS NEEDED FOR CHEST PAIN,MAY REPEAT IN5 MINUTES FOR 2 DOSES. (Patient not taking: No sig reported)   No facility-administered encounter medications on file as of 12/04/2020.    Allergies (verified) Metoprolol, Metformin, and Metformin and related   History: Past Medical History:  Diagnosis Date  . AAA (abdominal aortic aneurysm) ([employer])   . Anemia   . Arthritis   . CAD (coronary artery disease)   . Cancer ([place])    Melanoma - Back  . Carotid artery stenosis   . CHF (congestive heart failure) ([place])   . COPD (chronic obstructive pulmonary disease) ([place])   . Cyst of kidney, acquired   . Depression   . Diabetes mellitus   . DVT (deep venous thrombosis) ([place])   . Dysrhythmia   . Fatty liver 2008  . GERD (gastroesophageal reflux disease)   . Hyperlipidemia   . Hypertension   . IBS (irritable bowel syndrome)   . LVF (left ventricular failure) ([place])   . Paroxysmal atrial fibrillation ([facility])   . Pneumonia Jan. 2014  . Shortness of breath dyspnea   . Thyroid disease   . Typical atrial flutter ([place])   . UTI (urinary tract infection) 05/2015   While in [place]   . Vitamin D deficiency    Past Surgical History:  Procedure Laterality Date  . ABDOMINAL AORTIC ANEURYSM REPAIR  2002  . ABDOMINAL AORTOGRAM W/LOWER EXTREMITY Left 08/10/2020   Procedure: ABDOMINAL AORTOGRAM W/LOWER EXTREMITY;  Surgeon: Waynetta Sandy, MD;  Location[facility];  Service: Cardiovascular;  Laterality: Left;  . BIOPSY  06/10/2020   Procedure: BIOPSY;  Surgeon: Ronnette Juniper, MD;  Location: [place];  Service: Gastroenterology;;  . CARDIAC CATHETERIZATION    . CORONARY ANGIOPLASTY    . CORONARY ANGIOPLASTY WITH STENT PLACEMENT  2005  . CORONARY ARTERY BYPASS GRAFT  1992  . ESOPHAGEAL BRUSHING  06/10/2020    Procedure: ESOPHAGEAL BRUSHING;  Surgeon: Ronnette Juniper, MD;  Location: [place];  Service: Gastroenterology;;  . ESOPHAGOGASTRODUODENOSCOPY N/A 06/10/2020   Procedure: ESOPHAGOGASTRODUODENOSCOPY (EGD);  Surgeon: Ronnette Juniper, MD;  Location: [place];  Service: Gastroenterology;  Laterality: N/A;  . EYE SURGERY     [place]  . HERNIA REPAIR    . INGUINAL HERNIA REPAIR Bilateral    w/mesh  . INSERTION OF MESH N/A 11/28/2017   Procedure: INSERTION OF MESH;  Surgeon: Donnie Mesa, MD;  Location: [place];  Service: General;  Laterality: N/A;  GENERAL AND TAP BLOCK  . KNEE SURGERY Bilateral   . LAPAROSCOPIC INGUINAL HERNIA WITH UMBILICAL HERNIA Bilateral 11/28/2017   Procedure: LAPAROSCOPIC BILATERAL INGUINAL HERNIA REPAIR WITH MESH, UMBILICAL HERNIA REPAIR WITH MESH;  Surgeon: Donnie Mesa, MD;  Location: [place];  Service: General;  Laterality: Bilateral;  GENERAL AND TAP BLOCK  .  LEFT HEART CATHETERIZATION WITH CORONARY ANGIOGRAM N/A 10/30/2014   Procedure: LEFT HEART CATHETERIZATION WITH CORONARY ANGIOGRAM;  Surgeon: Larey Dresser, MD;  [facility];  Service: Cardiovascular;  Laterality: N/A;  . QUADRICEPS TENDON REPAIR Bilateral 08/2003   Archie Endo 05/10/2011  . UMBILICAL HERNIA REPAIR  11/28/2017   w/mesh   Family History  Problem Relation Age of Onset  . Colon cancer Father   . Coronary artery disease Brother   . Diabetes Brother   . Heart disease Brother   . Hyperlipidemia Brother   . Peripheral vascular disease Brother        Varicose Veins  . Throat cancer Brother        abdominal cancer?   . Diabetes Son   . Hyperlipidemia Son    Social History   Socioeconomic History  . Marital status: Married    Spouse name: Not on file  . Number of children: Not on file  . Years of education: Not on file  . Highest education level: Not on file  Occupational History  . Occupation: Retired  Tobacco Use  . Smoking status: Never Smoker  . Smokeless tobacco: Never Used   . Tobacco comment: quit atleast 25 yrs ago, per pt  Vaping Use  . Vaping Use: Never used  Substance and Sexual Activity  . Alcohol use: Never    Comment: socially  . Drug use: Never  . Sexual activity: Not Currently  Other Topics Concern  . Not on file  Social History Narrative   ** Merged History Encounter **       Pt lives in [place] with spouse. Retired from [employer].  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   Social Determinants of Health   Financial Resource Strain: Low Risk   . Difficulty of Paying Living Expenses: Not hard at all  Food Insecurity: No Food Insecurity  . Worried About Charity fundraiser in the Last Year: Never true  . Ran Out of Food in the Last Year: Never true  Transportation Needs: No Transportation Needs  . Lack of Transportation (Medical): No  . Lack of Transportation (Non-Medical): No  Physical Activity: Inactive  . Days of Exercise per Week: 0 days  . Minutes of Exercise per Session: 0 min  Stress: No Stress Concern Present  . Feeling of Stress : Not at all  Social Connections: Moderately Isolated  . Frequency of Communication with Friends and Family: More than three times a week  . Frequency of Social Gatherings with Friends and Family: More than three times a week  . Attends Religious Services: Never  . Active Member of Clubs or Organizations: No  . Attends Archivist Meetings: Never  . Marital Status: Married    Tobacco Counseling Counseling given: Not Answered Comment: quit atleast 25 yrs ago, per pt   Clinical Intake:  Pre-visit preparation completed: Yes  Pain : No/denies pain     BMI - recorded: 27.38 Nutritional Status: BMI 25 -29 Overweight Nutritional Risks: None Diabetes: Yes CBG done?: No Did pt. bring in CBG monitor from home?: No  How often do you need to have someone help you when you read instructions, pamphlets, or other written materials from your  doctor or pharmacy?: 1 - Never  Diabetic?Nutrition Risk Assessment:  Has the patient had any N/V/D within the last 2 months?  No  Does the patient have any non-healing wounds?  No  Has the patient had any unintentional  weight loss or weight gain?  No   Diabetes:  Is the patient diabetic?  Yes  If diabetic, was a CBG obtained today?  No  Did the patient bring in their glucometer from home?  No  How often do you monitor your CBG's? As needed.   Financial Strains and Diabetes Management:  Are you having any financial strains with the device, your supplies or your medication? No .  Does the patient want to be seen by Chronic Care Management for management of their diabetes?  No  Would the patient like to be referred to a Nutritionist or for Diabetic Management?  No   Diabetic Exams:  Diabetic Eye Exam: Overdue for diabetic eye exam. Pt has been advised about the importance in completing this exam. Patient advised to call and schedule an eye exam. Diabetic Foot Exam: Overdue, Pt has been advised about the importance in completing this exam. Pt is scheduled for diabetic foot exam on next physical appt .   Interpreter Needed?: No  Information entered by :: Charlott Rakes, LPN   Activities of Daily Living In your present state of health, do you have any difficulty performing the following activities: 12/04/2020 08/10/2020  Hearing? Y N  Comment left ear difficulty -  Vision? N N  Difficulty concentrating or making decisions? N N  Walking or climbing stairs? N Y  Dressing or bathing? N N  Doing errands, shopping? N -  Preparing Food and eating ? N -  Using the Toilet? N -  In the past six months, have you accidently leaked urine? N -  Do you have problems with loss of bowel control? N -  Managing your Medications? N -  Managing your Finances? N -  Housekeeping or managing your Housekeeping? N -  Some recent data might be hidden    Patient Care Team: [name], MD as  PCP - General (Family Medicine) Larey Dresser, MD as PCP - Advanced Heart Failure (Cardiology) Larey Dresser, MD as Consulting Physician (Cardiology) Jorge Ny, LCSW as Social Worker (Licensed Clinical Social Worker) Gardiner Barefoot, DPM as Consulting Physician (Podiatry) [name], MD (Family Medicine) Patient, No Pcp Per (General Practice)  Indicate any recent Medical Services you may have received from other than [facility] providers in the past year (date may be approximate).     Assessment:   This is a routine wellness examination for Andrew [name].  Hearing/Vision screen  Hearing Screening   125Hz  250Hz  500Hz  1000Hz  2000Hz  3000Hz  4000Hz  6000Hz  8000Hz   Right ear:           Left ear:           Comments: Has difficulty in left ear  Vision Screening Comments: Pt has not followed up in while   Dietary issues and exercise activities discussed: Current Exercise Habits: The patient does not participate in regular exercise at present  Goals   None    Depression Screen PHQ 2/9 Scores 12/04/2020 10/31/2019 12/22/2017 07/11/2016 10/21/2015 09/03/2015 07/05/2015  PHQ - 2 Score 0 0 0 0 0 0 0  PHQ- 9 Score - 0 - - - - -    Fall Risk Fall Risk  12/04/2020 10/31/2019 10/31/2019 12/22/2017 07/20/2017  Falls in the past year? 0 0 0 No Yes  Number falls in past yr: 0 - - - 1  Injury with Fall? 0 0 - - No  Risk Factor Category  - - - - -  Risk for fall due to :  Impaired vision Impaired mobility;Impaired balance/gait - - -  Follow up Falls prevention discussed Education provided;Falls prevention discussed;Falls evaluation completed - - -    FALL RISK PREVENTION PERTAINING TO THE HOME:   Home free of loose throw rugs in walkways, pet beds, electrical cords, etc? Yes  Adequate lighting in your home to reduce risk of falls? Yes   ASSISTIVE DEVICES UTILIZED TO PREVENT FALLS:  Life alert? No  Use of a cane, walker or w/c? Yes  TIMED UP AND GO:  Was the test performed? No .       Cognitive Function: Unable to due to hearing      6CIT Screen 10/31/2019  What Year? 0 points  What month? 0 points  What time? 0 points  Count back from 20 0 points  Months in reverse 0 points  Repeat phrase 2 points  Total Score 2    Immunizations Immunization History  Administered Date(s) Administered  . Fluad Quad(high Dose 65+) 09/17/2019  . Influenza Split 10/26/2012  . Influenza Whole 10/16/2007, 11/04/2009, 09/09/2010, 10/27/2011  . Influenza, High Dose Seasonal PF 09/29/2014, 09/29/2017  . Influenza-Unspecified 10/04/2013, 10/05/2015, 09/28/2016  . PFIZER SARS-COV-2 Vaccination 01/14/2020, 02/03/2020  . Pneumococcal Conjugate-13 03/23/2015  . Pneumococcal Polysaccharide-23 04/04/2013  . Tdap 01/16/2012    TDAP status: Up to date  Flu Vaccine status: Due, Education has been provided regarding the importance of this vaccine. Advised may receive this vaccine at local pharmacy or Health Dept. Aware to provide a copy of the vaccination record if obtained from local pharmacy or Health Dept. Verbalized acceptance and understanding.  Pneumococcal vaccine status: Up to date  Covid-19 vaccine status: Completed vaccines  Qualifies for Shingles Vaccine? Yes   Zostavax completed No   Shingrix Completed?: No.    Education has been provided regarding the importance of this vaccine. Patient has been advised to call insurance company to determine out of pocket expense if they have not yet received this vaccine. Advised may also receive vaccine at local pharmacy or Health Dept. Verbalized acceptance and understanding.  Screening Tests Health Maintenance  Topic Date Due  . COVID-19 Vaccine (3 - Pfizer risk 4-dose series) 03/02/2020  . FOOT EXAM  12/28/2020 (Originally 10/30/2020)  . OPHTHALMOLOGY EXAM  12/28/2020 (Originally 09/15/2020)  . INFLUENZA VACCINE  03/25/2021 (Originally 07/26/2020)  . TETANUS/TDAP  01/15/2022  . PNA vac Low Risk Adult  Completed    Health  Maintenance  Health Maintenance Due  Topic Date Due  . COVID-19 Vaccine (3 - Pfizer risk 4-dose series) 03/02/2020    Colorectal cancer screening: Type of screening: Colonoscopy. Completed 06/10/20. Repeat every 0 years    Additional Screening:  Vision Screening: Recommended annual ophthalmology exams for early detection of glaucoma and other disorders of the eye. Is the patient up to date with their annual eye exam?  No  Who is the provider or what is the name of the office in which the patient attends annual eye exams? Pt stated he hasn't in a while   Dental Screening: Recommended annual dental exams for proper oral hygiene  Community Resource Referral / Chronic Care Management: CRR required this visit?  No   CCM required this visit?  No      Plan:     I have personally reviewed and noted the following in the patient's chart:   . Medical and social history . Use of alcohol, tobacco or illicit drugs  . Current medications and supplements . Functional ability and status . Nutritional status .  Physical activity . Advanced directives . List of other physicians . Hospitalizations, surgeries, and ER visits in previous 12 months . Vitals . Screenings to include cognitive, depression, and falls . Referrals and appointments  In addition, I have reviewed and discussed with patient certain preventive protocols, quality metrics, and best practice recommendations. A written personalized care plan for preventive services as well as general preventive health recommendations were provided to patient.     Willette Brace, LPN   [id number]   Nurse Notes: None

## 2020-12-05 DIAGNOSIS — L89620 Pressure ulcer of left heel, unstageable: Secondary | ICD-10-CM | POA: Diagnosis not present

## 2020-12-07 DIAGNOSIS — D631 Anemia in chronic kidney disease: Secondary | ICD-10-CM | POA: Diagnosis not present

## 2020-12-07 DIAGNOSIS — E1122 Type 2 diabetes mellitus with diabetic chronic kidney disease: Secondary | ICD-10-CM | POA: Diagnosis not present

## 2020-12-07 DIAGNOSIS — N184 Chronic kidney disease, stage 4 (severe): Secondary | ICD-10-CM | POA: Diagnosis not present

## 2020-12-07 DIAGNOSIS — I872 Venous insufficiency (chronic) (peripheral): Secondary | ICD-10-CM | POA: Diagnosis not present

## 2020-12-07 DIAGNOSIS — E1151 Type 2 diabetes mellitus with diabetic peripheral angiopathy without gangrene: Secondary | ICD-10-CM | POA: Diagnosis not present

## 2020-12-07 DIAGNOSIS — E1169 Type 2 diabetes mellitus with other specified complication: Secondary | ICD-10-CM | POA: Diagnosis not present

## 2020-12-07 DIAGNOSIS — I130 Hypertensive heart and chronic kidney disease with heart failure and stage 1 through stage 4 chronic kidney disease, or unspecified chronic kidney disease: Secondary | ICD-10-CM | POA: Diagnosis not present

## 2020-12-07 DIAGNOSIS — L89620 Pressure ulcer of left heel, unstageable: Secondary | ICD-10-CM | POA: Diagnosis not present

## 2020-12-07 DIAGNOSIS — I5022 Chronic systolic (congestive) heart failure: Secondary | ICD-10-CM | POA: Diagnosis not present

## 2020-12-09 DIAGNOSIS — E1169 Type 2 diabetes mellitus with other specified complication: Secondary | ICD-10-CM | POA: Diagnosis not present

## 2020-12-09 DIAGNOSIS — E1151 Type 2 diabetes mellitus with diabetic peripheral angiopathy without gangrene: Secondary | ICD-10-CM | POA: Diagnosis not present

## 2020-12-09 DIAGNOSIS — N184 Chronic kidney disease, stage 4 (severe): Secondary | ICD-10-CM | POA: Diagnosis not present

## 2020-12-09 DIAGNOSIS — L89620 Pressure ulcer of left heel, unstageable: Secondary | ICD-10-CM | POA: Diagnosis not present

## 2020-12-09 DIAGNOSIS — D631 Anemia in chronic kidney disease: Secondary | ICD-10-CM | POA: Diagnosis not present

## 2020-12-09 DIAGNOSIS — I130 Hypertensive heart and chronic kidney disease with heart failure and stage 1 through stage 4 chronic kidney disease, or unspecified chronic kidney disease: Secondary | ICD-10-CM | POA: Diagnosis not present

## 2020-12-09 DIAGNOSIS — I872 Venous insufficiency (chronic) (peripheral): Secondary | ICD-10-CM | POA: Diagnosis not present

## 2020-12-09 DIAGNOSIS — E1122 Type 2 diabetes mellitus with diabetic chronic kidney disease: Secondary | ICD-10-CM | POA: Diagnosis not present

## 2020-12-09 DIAGNOSIS — I5022 Chronic systolic (congestive) heart failure: Secondary | ICD-10-CM | POA: Diagnosis not present

## 2020-12-10 ENCOUNTER — Other Ambulatory Visit (HOSPITAL_COMMUNITY): Payer: Self-pay

## 2020-12-10 ENCOUNTER — Other Ambulatory Visit (HOSPITAL_COMMUNITY): Payer: Self-pay | Admitting: Cardiology

## 2020-12-10 NOTE — Progress Notes (Signed)
Paramedicine Encounter    Patient ID: Andrew [name], male    DOB: 02/11/1928, 86 y.o.   MRN: [id number]   Patient Care Team: [name], MD as PCP - General (Family Medicine) Larey Dresser, MD as PCP - Advanced Heart Failure (Cardiology) Larey Dresser, MD as Consulting Physician (Cardiology) Jorge Ny, LCSW as Social Worker (Licensed Clinical Social Worker) Gardiner Barefoot, DPM as Consulting Physician (Podiatry) [name], MD (Family Medicine) Patient, No Pcp Per (General Practice)  Patient Active Problem List   Diagnosis Date Noted  . Acute respiratory failure with hypercapnia ([place]) 06/02/2020  . Acute metabolic encephalopathy 24/58/0998  . CKD (chronic kidney disease), stage IV ([place]) 06/02/2020  . PAF (paroxysmal atrial fibrillation) ([place]) 06/02/2020  . Hypothyroidism 06/02/2020  . Chronic anticoagulation 06/02/2020  . Debility 06/01/2020  . Lumbar spondylosis 10/04/2018  . Chronic respiratory failure with hypoxia ([place]) 09/16/2018  . Pain of right thumb 06/19/2018  . Bilateral recurrent inguinal hernias 11/28/2017  . Carotid artery stenosis 01/12/2016  . Chronic systolic CHF (congestive heart failure) ([place]) 12/02/2015  . 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 ([place]) 10/16/2015  . PVD (peripheral vascular disease) ([place])   . Hereditary and idiopathic peripheral neuropathy 10/02/2015  . Peripelvic (lymphatic) cyst   . Pernicious anemia 07/15/2015  . Bilateral renal cysts 07/12/2015  . Lung nodule, 61mm RML CT 07/12/15 07/12/2015  . Stage 3 chronic kidney disease ([place]) 06/14/2015  . Paroxysmal atrial fibrillation ([place]) 05/28/2015  . AAA (abdominal aortic aneurysm) without rupture ([place]) 09/30/2014  . Cardiomyopathy, ischemic 05/23/2014  . DM (diabetes mellitus), type 2 with renal complications ([place]) 33/82/5053  . Depression, major, single episode, complete remission ([place]) 03/18/2010  . Hypothyroidism 02/03/2009  . Osteoarthritis 05/27/2008  .  Hyperlipidemia associated with type 2 diabetes mellitus ([place]) 06/14/2007  . Hypertension associated with diabetes ([place]) 06/14/2007  . GERD 06/14/2007    Current Outpatient Medications:  .  amiodarone (PACERONE) 200 MG tablet, Take 0.5 tablets (100 mg total) by mouth daily., Disp: 45 tablet, Rfl: 3 .  apixaban (ELIQUIS) 2.5 MG TABS tablet, Take 2.5 mg by mouth 2 (two) times daily. , Disp: , Rfl:  .  atorvastatin (LIPITOR) 80 MG tablet, Take 1 tablet (80 mg total) by mouth daily., Disp: 30 tablet, Rfl: 3 .  carvedilol (COREG) 12.5 MG tablet, Take 12.5 mg by mouth 2 (two) times daily with a meal., Disp: , Rfl:  .  cilostazol (PLETAL) 100 MG tablet, Take 1 tablet (100 mg total) by mouth 2 (two) times daily., Disp: 180 tablet, Rfl: 3 .  ezetimibe (ZETIA) 10 MG tablet, TAKE (1) TABLET DAILY., Disp: 90 tablet, Rfl: 0 .  FLUoxetine (PROZAC) 20 MG capsule, TAKE (1) CAPSULE DAILY., Disp: 90 capsule, Rfl: 0 .  gabapentin (NEURONTIN) 100 MG capsule, Take 100 mg by mouth 3 (three) times daily., Disp: , Rfl:  .  hydrALAZINE (APRESOLINE) 50 MG tablet, Take 1 tablet (50 mg total) by mouth 3 (three) times daily., Disp: 90 tablet, Rfl: 3 .  isosorbide mononitrate (IMDUR) 30 MG 24 hr tablet, Take 1 tablet (30 mg total) by mouth daily., Disp: 30 tablet, Rfl: 3 .  levothyroxine (SYNTHROID) 100 MCG tablet, TAKE 1 TABLET BEFORE BREAKFAST., Disp: 90 tablet, Rfl: 0 .  Multiple Vitamins-Minerals (MULTIVITAMIN ADULTS 50+) TABS, Take 1 tablet by mouth daily., Disp: , Rfl:  .  pantoprazole (PROTONIX) 40 MG tablet, Take 1 tablet (40 mg total) by mouth 2 (two) times daily., Disp: , Rfl:  .  potassium chloride SA (KLOR-CON) 20 MEQ tablet, Take 20 mEq by mouth daily., Disp: , Rfl:  .  tiotropium (SPIRIVA) 18 MCG inhalation capsule, Place 18 mcg into inhaler and inhale daily as needed (for wheezing)., Disp: , Rfl:  .  torsemide (DEMADEX) 20 MG tablet, Take 4 tablets (80 mg total) by mouth 2 (two) times daily., Disp: 240 tablet,  Rfl: 3 .  acetaminophen (TYLENOL) 500 MG tablet, Take 2 tablets (1,000 mg total) by mouth every 8 (eight) hours as needed., Disp: 30 tablet, Rfl: 0 .  glucose blood (ACCU-CHEK AVIVA PLUS) test strip, CHECK BLOOD SUGAR 4 TIMES A DAY., Disp: 100 each, Rfl: 11 .  HYDROcodone-acetaminophen (NORCO/VICODIN) 5-325 MG tablet, TAKE 1 TABLET EVERY 6 HOURS AS NEEDED FOR MODERATE PAIN. (Patient not taking: No sig reported), Disp: 12 tablet, Rfl: 0 .  metolazone (ZAROXOLYN) 2.5 MG tablet, Take 1 tablet (2.5 mg total) by mouth as directed. By [facility] (Patient not taking: No sig reported), Disp: 3 tablet, Rfl: 0 .  nitroGLYCERIN (NITROSTAT) 0.4 MG SL tablet, DISSOLVE 1 TABLET UNDER TONGUE AS NEEDED FOR CHEST PAIN,MAY REPEAT IN5 MINUTES FOR 2 DOSES. (Patient not taking: No sig reported), Disp: 25 tablet, Rfl: 3 Allergies  Allergen Reactions  . Metoprolol Shortness Of Breath, Palpitations and Other (See Comments)    Heart starts racing. Shallow breathing; pt states he also get skin irritation on his legs  . Metformin Hives and Swelling    On legs  . Metformin And Related Nausea And Vomiting      Social History   Socioeconomic History  . Marital status: Married    Spouse name: Not on file  . Number of children: Not on file  . Years of education: Not on file  . Highest education level: Not on file  Occupational History  . Occupation: Retired  Tobacco Use  . Smoking status: Never Smoker  . Smokeless tobacco: Never Used  . Tobacco comment: quit atleast 25 yrs ago, per pt  Vaping Use  . Vaping Use: Never used  Substance and Sexual Activity  . Alcohol use: Never    Comment: socially  . Drug use: Never  . Sexual activity: Not Currently  Other Topics Concern  . Not on file  Social History Narrative   ** Merged History Encounter **       Pt lives in [place] with spouse. Retired from [employer].  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   Social  Determinants of Health   Financial Resource Strain: Low Risk   . Difficulty of Paying Living Expenses: Not hard at all  Food Insecurity: No Food Insecurity  . Worried About Charity fundraiser in the Last Year: Never true  . Ran Out of Food in the Last Year: Never true  Transportation Needs: No Transportation Needs  . Lack of Transportation (Medical): No  . Lack of Transportation (Non-Medical): No  Physical Activity: Inactive  . Days of Exercise per Week: 0 days  . Minutes of Exercise per Session: 0 min  Stress: No Stress Concern Present  . Feeling of Stress : Not at all  Social Connections: Moderately Isolated  . Frequency of Communication with Friends and Family: More than three times a week  . Frequency of Social Gatherings with Friends and Family: More than three times a week  . Attends Religious Services: Never  . Active Member of Clubs or Organizations: No  . Attends Archivist Meetings: Never  .  Marital Status: Married  Human resources officer Violence: Not At Risk  . Fear of Current or Ex-Partner: No  . Emotionally Abused: No  . Physically Abused: No  . Sexually Abused: No    Physical Exam Cardiovascular:     Rate and Rhythm: Normal rate and regular rhythm.     Pulses: Normal pulses.  Pulmonary:     Effort: Pulmonary effort is normal.     Breath sounds: Normal breath sounds.  Musculoskeletal:        General: Normal range of motion.     Right lower leg: No edema.     Left lower leg: No edema.  Skin:    General: Skin is warm and dry.     Capillary Refill: Capillary refill takes less than 2 seconds.  Neurological:     Mental Status: He is alert and oriented to person, place, and time.  Psychiatric:        Mood and Affect: Mood normal.         Future Appointments  Date Time Provider [place]  12/11/2020  1:30 PM Ricard Dillon, MD [facility]  01/14/2021  1:40 PM Larey Dresser, MD [facility] None    BP (!) 117/55 (BP Location: Left  Arm, Patient Position: Sitting, Cuff Size: Normal)   Pulse 62   Resp 16   Wt 174 lb 9.6 oz (79.2 kg)   SpO2 94%   BMI 27.35 kg/m   Weight yesterday- did not weigh Last visit weight- 175.8 lb  Mr Oien was seen at home today and reported feeling well. He denied chest pain, SOB, headache, dizziness, orthopnea, fever or cough over the past week. He stated he has been mostly compliant with his medications and his weight has been stable. His medications were verified and two pillboxes were refilled for him to account for the upcoming holiday. I will follow up in two weeks.   [name], EMT 12/10/20  ACTION: Home visit completed Next visit planned for 2 weeks

## 2020-12-11 ENCOUNTER — Encounter (HOSPITAL_BASED_OUTPATIENT_CLINIC_OR_DEPARTMENT_OTHER): Payer: Medicare PPO | Admitting: Internal Medicine

## 2020-12-11 ENCOUNTER — Other Ambulatory Visit: Payer: Self-pay

## 2020-12-11 DIAGNOSIS — L97818 Non-pressure chronic ulcer of other part of right lower leg with other specified severity: Secondary | ICD-10-CM | POA: Diagnosis not present

## 2020-12-11 DIAGNOSIS — I87333 Chronic venous hypertension (idiopathic) with ulcer and inflammation of bilateral lower extremity: Secondary | ICD-10-CM | POA: Diagnosis not present

## 2020-12-11 DIAGNOSIS — L97811 Non-pressure chronic ulcer of other part of right lower leg limited to breakdown of skin: Secondary | ICD-10-CM | POA: Diagnosis not present

## 2020-12-11 DIAGNOSIS — E11621 Type 2 diabetes mellitus with foot ulcer: Secondary | ICD-10-CM | POA: Diagnosis not present

## 2020-12-11 DIAGNOSIS — L97829 Non-pressure chronic ulcer of other part of left lower leg with unspecified severity: Secondary | ICD-10-CM | POA: Diagnosis not present

## 2020-12-11 DIAGNOSIS — L97422 Non-pressure chronic ulcer of left heel and midfoot with fat layer exposed: Secondary | ICD-10-CM | POA: Diagnosis not present

## 2020-12-14 NOTE — Progress Notes (Signed)
HAADI, SANTELLAN ([id number]) Visit Report for 12/11/2020 Debridement Details Patient Name: Date of Service: Andrew [name], [name] 12/11/2020 1:30 PM Medical Record Number: [id number] Patient Account Number: [id number] Date of Birth/Sex: Treating RN: [birth year] (86 y.o. Janyth Contes Primary Care Provider: Dimas Chyle Other Clinician: Referring Provider: Treating Provider/Extender: Donalee Citrin in Treatment: 13 Debridement Performed for Assessment: Wound #2 Left Calcaneus Performed By: Physician Ricard Dillon., MD Debridement Type: Debridement Severity of Tissue Pre Debridement: Fat layer exposed Level of Consciousness (Pre-procedure): Awake and Alert Pre-procedure Verification/Time Out Yes - 14:13 Taken: Start Time: 14:13 T Area Debrided (L x W): otal 0.7 (cm) x 1.1 (cm) = 0.77 (cm) Tissue and other material debrided: Non-Viable, Callus, Skin: Epidermis Level: Skin/Epidermis Debridement Description: Selective/Open Wound Instrument: Curette Bleeding: None Hemostasis Achieved: Pressure End Time: 14:14 Procedural Pain: 0 Post Procedural Pain: 0 Response to Treatment: Procedure was tolerated well Level of Consciousness (Post- Awake and Alert procedure): Post Debridement Measurements of Total Wound Length: (cm) 0.7 Width: (cm) 1.1 Depth: (cm) 0.1 Volume: (cm) 0.06 Character of Wound/Ulcer Post Debridement: Improved Severity of Tissue Post Debridement: Fat layer exposed Post Procedure Diagnosis Same as Pre-procedure Electronic Signature(s) Signed: 12/11/2020 4:58:57 PM By: Linton Ham MD Signed: 12/11/2020 5:46:53 PM By: Levan Hurst RN, BSN Entered By: Linton Ham on 12/11/2020 14:21:01 -------------------------------------------------------------------------------- HPI Details Patient Name: Date of Service: Andrew [name], [name] 12/11/2020 1:30 PM Medical Record Number: [id number] Patient Account Number: [id number] Date of  Birth/Sex: Treating RN: [birth year] (86 y.o. Janyth Contes Primary Care Provider: Dimas Chyle Other Clinician: Referring Provider: Treating Provider/Extender: Donalee Citrin in Treatment: 13 History of Present Illness HPI Description: 09/11/2020 ADMISSION This is a soon-to-be 86 year old man who was hospitalized in June and subsequently spent time at [place] home. At some point he developed a pressure ulcer on his left heel. On discharge he was seen by podiatry and referred to Dr. Donzetta Matters with regards to the possibility of PAD. The patient underwent angiography on 08/10/2020 this showed heavily calcified vessels. The common iliac artery on the left appeared to have calcific disease but there was no gradient. Left common external leg arteries were heavily tortuous left lower extremity limited angiography developed demonstrated a SFA to be helped heavily calcified throughout with multiple greater than 50% stenosis but never frankly occludes he was not felt to be a candidate for endovascular repair. I think they had a minimal look at the right side and it appeared that the SFA was occluded and reconstitutes above the knee. The overall feeling was that if he could not heal his left heel ulcer he will eventually best be served by a left above-knee amputation The last I can see is that he should have been using a Santyl wet-to-dry dressing to the heel. During his stay with podiatry was also noted that he had venous insufficiency and I think venous insufficiency wounds. His wife tells me that he has been recommended for compression stockings in the past but the patient refused, he has wounds on his bilateral anterior lower legs right greater than the left. Chronic venous inflammation and dry flaking skin. Past medical history; heart failure, right heel wound, paroxysmal atrial fibrillation on Eliquis, abdominal aortic aneurysm with previous repair, COPD, DVT, melanoma on  his back, diabetes with a recent hemoglobin A1c of 7.3 however he is not on any treatment [type II], PAD, coronary artery disease, hypertension The patient's most recent arterial studies were on 05/28/2020. This  showed an ABI on the right at 1.12 but monophasic waveforms and absent waveforms at the posterior tibial artery. On the left his ABI was 1.47 [noncompressible] he had triphasic waveforms at the ATA and monophasic at the PTA. His TBI on the right was 1.12 on the left and 0.97. It was felt his ABIs were falsely elevated 9/24. The patient followed up with Dr. Donzetta Matters and had noninvasive arterial studies. On the right his ABIs were noncompressible monophasic waveforms with a TBI of 1.45 and a right great toe pressure of 172. On the left he had monophasic waveforms with noncompressible ABIs TBI of 0.79 and a left great toe pressure of 94. He will be seen in follow-up by Dr. Donzetta Matters in a month's time. The patient also has very significant chronic venous insufficiency 10/8; patient with known PAD. He came in with bilateral lower extremity wounds likely secondary to venous stasis. Still has one on the upper right tibial area. The area on the left leg is healed however he still has an area on the left heel. This needed an extensive debridement today. His wife brings in notes apparently Dr. March Rummage ordered an MRI of the left heel. I was unaware of this. I was not aware that he did not been seen by Dr. March Rummage and I do not have any records of this. Apparently MRI called to set up the appointment. I will need to look through epic on this 10/15; this is a patient with known PAD who was revascularized. He also has venous stasis disease. The area that was on the right anterior tibia has actually close down although he has a still a small area on the upper anterior tibia. 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 We have been using Iodoflex on this area and he is required debridements. We tried to get her to agree to an MRI that have been ordered by Dr. March Rummage. However she did not get back to the MRI people or they have not called her back. We will reorder this. He is quite tender around this wound and I wonder about osteomyelitis. 10/29; MRI. Fortunately this did not show any evidence of abscess or osteomyelitis in the left heel. Vein and vascular on 10/21; this is a patient who is previously had an angiogram. He had a heavily calcified aorta and iliac vessels as well as the SFA. He had multiple areas of diffuse 50% stenosis. No interventions were apparently done by Dr. Donzetta Matters he does have an occluded right SFA but with above-knee reconstitution. He was not felt to be a candidate for revascularization for his diffuse areas of greater than 50% stenosis. It was not felt that these were amenable to intervention Since he was last here he went to see dermatology. I believe he had skin cancer extractions from the right area anteriorly and 2 on the left medial. 11/12; left heel actually looks somewhat better but still requiring debridement. We've been using Iodoflex Right anterior mid tibia surgical extraction for a skin cancer. This also required debridement The area on the left lower medial leg looks a lot better and is just about fully epithelialized 12/3; pressure ulcer on the left heel in the setting of PAD. This looks better still requiring debridement we have been using Iodoflex Right anterior mid tibia surgical extraction  for cancer this also continually requires debridement although the wound measures smaller The area on the left lower medial leg had much more eschar on this than last time this is a deterioration. Notable for the fact that home health only wrap the leg halfway and actually had one of the wraps at the level of the  wound on the right anterior. 12/17; the area on the left heel is just about closed. The area on the right anterior tibia looks much healthier and is smaller. The area on the left medial ankle I think is also smaller. We have been using Iodoflex to all areas under 3 layer compression bilaterally. He has home Chiropractor) Signed: 12/11/2020 4:58:57 PM By: Linton Ham MD Entered By: Linton Ham on 12/11/2020 14:21:43 -------------------------------------------------------------------------------- Physical Exam Details Patient Name: Date of Service: Andrew [name], [name] 12/11/2020 1:30 PM Medical Record Number: [id number] Patient Account Number: [id number] Date of Birth/Sex: Treating RN: 07/22/1928 (86 y.o. Janyth Contes Primary Care Provider: Dimas Chyle Other Clinician: Referring Provider: Treating Provider/Extender: Donalee Citrin in Treatment: 13 Constitutional Sitting or standing Blood Pressure is within target range for patient.. Pulse regular and within target range for patient.Marland Kitchen Respirations regular, non-labored and within target range.. Temperature is normal and within the target range for the patient.Marland Kitchen Appears in no distress. Cardiovascular Pedal pulses are palpable. Edema control is excellent. Notes Wound exam; using a #3 curette I removed callus and skin from the surface of the heel wound. Underneath this I think most of it is completely resolved. There may be a small open area remaining Right anterior leg wound smaller in terms of surface area healthy granulation Left medial lower leg still some eschar and debris although I think this is closing down. Electronic Signature(s) Signed: 12/11/2020 4:58:57 PM By: Linton Ham MD Entered By: Linton Ham on 12/11/2020 14:22:50 -------------------------------------------------------------------------------- Physician Orders Details Patient Name: Date of Service: Andrew [name],  [name] 12/11/2020 1:30 PM Medical Record Number: [id number] Patient Account Number: [id number] Date of Birth/Sex: Treating RN: 1928-08-28 (86 y.o. Janyth Contes Primary Care Provider: Dimas Chyle Other Clinician: Referring Provider: Treating Provider/Extender: Donalee Citrin in Treatment: 13 Verbal / Phone Orders: No Diagnosis Coding ICD-10 Coding Code Description E11.621 Type 2 diabetes mellitus with foot ulcer L97.422 Non-pressure chronic ulcer of left heel and midfoot with fat layer exposed I87.333 Chronic venous hypertension (idiopathic) with ulcer and inflammation of bilateral lower extremity L97.811 Non-pressure chronic ulcer of other part of right lower leg limited to breakdown of skin L97.818 Non-pressure chronic ulcer of other part of right lower leg with other specified severity L97.829 Non-pressure chronic ulcer of other part of left lower leg with unspecified severity T81.31XA Disruption of external operation (surgical) wound, not elsewhere classified, initial encounter Follow-up Appointments Return appointment in 3 weeks. Bathing/ Shower/ Hygiene May shower with protection but do not get wound dressing(s) wet. Edema Control - Lymphedema / SCD / Other Bilateral Lower Extremities Elevate legs to the level of the heart or above for 30 minutes daily and/or when sitting, a frequency of: - throughout the day Avoid standing for long periods of time. Exercise regularly Home Health No change in wound care orders this week; continue Home Health for wound care. May utilize formulary equivalent dressing for wound treatment orders unless otherwise specified. Other Home Health Orders/Instructions: - Medi Home Wound Treatment Wound #2 - Calcaneus Wound Laterality: Left Cleanser: Normal Saline (Home Health) 2 x Per Week/30 Days Discharge Instructions:  Cleanse the wound with Normal Saline or wound cleanser prior to applying a clean dressing using gauze  sponges, not tissue or cotton balls. Peri-Wound Care: Sween Lotion (Moisturizing lotion) (Home Health) 2 x Per Week/30 Days Discharge Instructions: Apply moisturizing lotion or equivalent moisterizing lotion to legs under wraps Prim Dressing: Optifoam Non-Adhesive Dressing, 4x4 in (Home Health) 2 x Per Week/30 Days ary Discharge Instructions: Apply foam or heel cup to heel for proection pr Secondary Dressing: ALLEVYN Heel 4 1/2in x 5 1/2in / 10.5cm x 13.5cm (Home Health) 2 x Per Week/30 Days Discharge Instructions: Apply over primary dressing as directed. Compression Wrap: Kerlix Roll 4.5x3.1 (in/yd) 2 x Per Week/30 Days Discharge Instructions: Apply Kerlix and Coban compression , wrap from base of toes to knee Compression Wrap: Coban Self-Adherent Wrap 4x5 (in/yd) 2 x Per Week/30 Days Discharge Instructions: Apply over Kerlix as directed. Wound #4 - Lower Leg Wound Laterality: Right, Anterior, Proximal Cleanser: Normal Saline (Home Health) 2 x Per Week/30 Days Discharge Instructions: Cleanse the wound with Normal Saline prior or wound cleanser to applying a clean dressing using gauze sponges, not tissue or cotton balls. Peri-Wound Care: Sween Lotion (Moisturizing lotion) (Home Health) 2 x Per Week/30 Days Discharge Instructions: Apply moisturizing lotion or equivalent moisterizing lotion to legs under wraps Prim Dressing: IODOFLEX 0.9% Cadexomer Iodine Pad 4x6 cm (Home Health) 2 x Per Week/30 Days ary Discharge Instructions: Apply Iodoflex or Iodosorb to wound bed as instructed Secondary Dressing: Woven Gauze Sponge, Non-Sterile 4x4 in (Home Health) 2 x Per Week/30 Days Discharge Instructions: Apply over primary dressing as directed. Compression Wrap: Kerlix Roll 4.5x3.1 (in/yd) 2 x Per Week/30 Days Discharge Instructions: Apply Kerlix and Coban compression , wrap from base of toes to knee Compression Wrap: Coban Self-Adherent Wrap 4x5 (in/yd) 2 x Per Week/30 Days Discharge Instructions:  Apply over Kerlix as directed. Wound #5 - Lower Leg Wound Laterality: Left, Medial Cleanser: Normal Saline (Home Health) 2 x Per Week/30 Days Discharge Instructions: Cleanse the wound with Normal Saline or wound cleanser prior to applying a clean dressing using gauze sponges, not tissue or cotton balls. Peri-Wound Care: Sween Lotion (Moisturizing lotion) (Home Health) 2 x Per Week/30 Days Discharge Instructions: Apply moisturizing lotion or equivalent moisterizing lotion to legs under wraps Prim Dressing: IODOFLEX 0.9% Cadexomer Iodine Pad 4x6 cm (Home Health) 2 x Per Week/30 Days ary Discharge Instructions: Apply Iodoflex or Iodosorb to wound bed as instructed Secondary Dressing: Woven Gauze Sponge, Non-Sterile 4x4 in (Home Health) 2 x Per Week/30 Days Discharge Instructions: Apply over primary dressing as directed. Compression Wrap: Kerlix Roll 4.5x3.1 (in/yd) 2 x Per Week/30 Days Discharge Instructions: Apply Kerlix and Coban compression , wrap from base of toes to knee Compression Wrap: Coban Self-Adherent Wrap 4x5 (in/yd) 2 x Per Week/30 Days Discharge Instructions: Apply over Kerlix as directed. Electronic Signature(s) Signed: 12/11/2020 5:46:53 PM By: Levan Hurst RN, BSN Signed: 12/14/2020 7:23:52 PM By: Linton Ham MD Previous Signature: 12/11/2020 4:58:57 PM Version By: Linton Ham MD Entered By: Levan Hurst on 12/11/2020 17:17:15 -------------------------------------------------------------------------------- Problem List Details Patient Name: Date of Service: Andrew [name], [name] 12/11/2020 1:30 PM Medical Record Number: [id number] Patient Account Number: [id number] Date of Birth/Sex: Treating RN: Jun 20, 1928 (86 y.o. Janyth Contes Primary Care Provider: Dimas Chyle Other Clinician: Referring Provider: Treating Provider/Extender: Donalee Citrin in Treatment: 13 Active Problems ICD-10 Encounter Code Description Active Date  MDM Code Description Active Date MDM Diagnosis E11.621 Type 2 diabetes mellitus with foot ulcer 09/11/2020  No Yes L97.422 Non-pressure chronic ulcer of left heel and midfoot with fat layer exposed 09/11/2020 No Yes I87.333 Chronic venous hypertension (idiopathic) with ulcer and inflammation of 09/11/2020 No Yes bilateral lower extremity L97.811 Non-pressure chronic ulcer of other part of right lower leg limited to breakdown 09/11/2020 No Yes of skin L97.818 Non-pressure chronic ulcer of other part of right lower leg with other specified 10/23/2020 No Yes severity L97.829 Non-pressure chronic ulcer of other part of left lower leg with unspecified 10/23/2020 No Yes severity T81.31XA Disruption of external operation (surgical) wound, not elsewhere classified, 10/23/2020 No Yes initial encounter Inactive Problems ICD-10 Code Description Active Date Inactive Date L97.821 Non-pressure chronic ulcer of other part of left lower leg limited to breakdown of skin 09/11/2020 09/11/2020 Resolved Problems Electronic Signature(s) Signed: 12/11/2020 4:58:57 PM By: Linton Ham MD Entered By: Linton Ham on 12/11/2020 14:20:21 -------------------------------------------------------------------------------- Progress Note Details Patient Name: Date of Service: Andrew [name], [name] 12/11/2020 1:30 PM Medical Record Number: [id number] Patient Account Number: [id number] Date of Birth/Sex: Treating RN: [birth year] (86 y.o. Janyth Contes Primary Care Provider: Dimas Chyle Other Clinician: Referring Provider: Treating Provider/Extender: Donalee Citrin in Treatment: 13 Subjective History of Present Illness (HPI) 09/11/2020 ADMISSION This is a soon-to-be 86 year old man who was hospitalized in June and subsequently spent time at [place] home. At some point he developed a pressure ulcer on his left heel. On discharge he was seen by podiatry and referred to Dr. Donzetta Matters  with regards to the possibility of PAD. The patient underwent angiography on 08/10/2020 this showed heavily calcified vessels. The common iliac artery on the left appeared to have calcific disease but there was no gradient. Left common external leg arteries were heavily tortuous left lower extremity limited angiography developed demonstrated a SFA to be helped heavily calcified throughout with multiple greater than 50% stenosis but never frankly occludes he was not felt to be a candidate for endovascular repair. I think they had a minimal look at the right side and it appeared that the SFA was occluded and reconstitutes above the knee. The overall feeling was that if he could not heal his left heel ulcer he will eventually best be served by a left above-knee amputation The last I can see is that he should have been using a Santyl wet-to-dry dressing to the heel. During his stay with podiatry was also noted that he had venous insufficiency and I think venous insufficiency wounds. His wife tells me that he has been recommended for compression stockings in the past but the patient refused, he has wounds on his bilateral anterior lower legs right greater than the left. Chronic venous inflammation and dry flaking skin. Past medical history; heart failure, right heel wound, paroxysmal atrial fibrillation on Eliquis, abdominal aortic aneurysm with previous repair, COPD, DVT, melanoma on his back, diabetes with a recent hemoglobin A1c of 7.3 however he is not on any treatment [type II], PAD, coronary artery disease, hypertension The patient's most recent arterial studies were on 05/28/2020. This showed an ABI on the right at 1.12 but monophasic waveforms and absent waveforms at the posterior tibial artery. On the left his ABI was 1.47 [noncompressible] he had triphasic waveforms at the ATA and monophasic at the PTA. His TBI on the right was 1.12 on the left and 0.97. It was felt his ABIs were falsely  elevated 9/24. The patient followed up with Dr. Donzetta Matters and had noninvasive arterial studies. On the right his ABIs were noncompressible monophasic  waveforms with a TBI of 1.45 and a right great toe pressure of 172. On the left he had monophasic waveforms with noncompressible ABIs TBI of 0.79 and a left great toe pressure of 94. He will be seen in follow-up by Dr. Donzetta Matters in a month's time. The patient also has very significant chronic venous insufficiency 10/8; patient with known PAD. He came in with bilateral lower extremity wounds likely secondary to venous stasis. Still has one on the upper right tibial area. The area on the left leg is healed however he still has an area on the left heel. This needed an extensive debridement today. His wife brings in notes apparently Dr. March Rummage ordered an MRI of the left heel. I was unaware of this. I was not aware that he did not been seen by Dr. March Rummage and I do not have any records of this. Apparently MRI called to set up the appointment. I will need to look through epic on this 10/15; this is a patient with known PAD who was revascularized. He also has venous stasis disease. The area that was on the right anterior tibia has actually close down although he has a still a small area on the upper anterior tibia. 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 We have been using Iodoflex on this area and he is required debridements. We tried to get her to agree to an MRI that have been ordered by Dr. March Rummage. However she did not get back to the MRI people or they have not called her back. We will reorder this. He is quite tender around this wound and I wonder about osteomyelitis. 10/29; MRI. Fortunately this did not show any evidence of abscess or osteomyelitis in the left heel. Vein and vascular on  10/21; this is a patient who is previously had an angiogram. He had a heavily calcified aorta and iliac vessels as well as the SFA. He had multiple areas of diffuse 50% stenosis. No interventions were apparently done by Dr. Donzetta Matters he does have an occluded right SFA but with above-knee reconstitution. He was not felt to be a candidate for revascularization for his diffuse areas of greater than 50% stenosis. It was not felt that these were amenable to intervention Since he was last here he went to see dermatology. I believe he had skin cancer extractions from the right area anteriorly and 2 on the left medial. 11/12; left heel actually looks somewhat better but still requiring debridement. We've been using Iodoflex ooRight anterior mid tibia surgical extraction for a skin cancer. This also required debridement ooThe area on the left lower medial leg looks a lot better and is just about fully epithelialized 12/3; pressure ulcer on the left heel in the setting of PAD. This looks better still requiring debridement we have been using Iodoflex ooRight anterior mid tibia surgical extraction for cancer this also continually requires debridement although the wound measures smaller ooThe area on the left lower medial leg had much more eschar on this than last time this is a deterioration. ooNotable for the fact that home health only wrap the leg halfway and actually had one of the wraps at the level of the wound on the right anterior. 12/17; the area on the left heel is just about closed. The area on the right anterior tibia  looks much healthier and is smaller. The area on the left medial ankle I think is also smaller. We have been using Iodoflex to all areas under 3 layer compression bilaterally. He has home health Objective Constitutional Sitting or standing Blood Pressure is within target range for patient.. Pulse regular and within target range for patient.Marland Kitchen Respirations regular, non-labored and within  target range.. Temperature is normal and within the target range for the patient.Marland Kitchen Appears in no distress. Vitals Time Taken: 2:42 PM, Height: 67 in, Weight: 185 lbs, BMI: 29, Temperature: 97.8 F, Pulse: 60 bpm, Respiratory Rate: 18 breaths/min, Blood Pressure: 117/63 mmHg. Cardiovascular Pedal pulses are palpable. Edema control is excellent. General Notes: Wound exam; using a #3 curette I removed callus and skin from the surface of the heel wound. Underneath this I think most of it is completely resolved. There may be a small open area remaining ooRight anterior leg wound smaller in terms of surface area healthy granulation ooLeft medial lower leg still some eschar and debris although I think this is closing down. Integumentary (Hair, Skin) Wound #2 status is Open. Original cause of wound was Gradually Appeared. The wound is located on the Left Calcaneus. The wound measures 0.7cm length x 1.1cm width x 0.1cm depth; 0.605cm^2 area and 0.06cm^3 volume. There is Fat Layer (Subcutaneous Tissue) exposed. There is no tunneling or undermining noted. There is a none present amount of drainage noted. The wound margin is flat and intact. There is no granulation within the wound bed. There is no necrotic tissue within the wound bed. General Notes: callous over the wound bed. Wound #4 status is Open. Original cause of wound was Gradually Appeared. The wound is located on the Right,Proximal,Anterior Lower Leg. The wound measures 1.9cm length x 1.4cm width x 0.1cm depth; 2.089cm^2 area and 0.209cm^3 volume. There is Fat Layer (Subcutaneous Tissue) exposed. There is no tunneling or undermining noted. There is a medium amount of serosanguineous drainage noted. The wound margin is flat and intact. There is medium (34-66%) red granulation within the wound bed. There is a medium (34-66%) amount of necrotic tissue within the wound bed including Adherent Slough. Wound #5 status is Open. Original cause of wound was  Gradually Appeared. The wound is located on the Left,Medial Lower Leg. The wound measures 1cm length x 0.7cm width x 0.1cm depth; 0.55cm^2 area and 0.055cm^3 volume. There is Fat Layer (Subcutaneous Tissue) exposed. There is no tunneling or undermining noted. There is a medium amount of serosanguineous drainage noted. The wound margin is distinct with the outline attached to the wound base. There is large (67-100%) red granulation within the wound bed. There is no necrotic tissue within the wound bed. Assessment Active Problems ICD-10 Type 2 diabetes mellitus with foot ulcer Non-pressure chronic ulcer of left heel and midfoot with fat layer exposed Chronic venous hypertension (idiopathic) with ulcer and inflammation of bilateral lower extremity Non-pressure chronic ulcer of other part of right lower leg limited to breakdown of skin Non-pressure chronic ulcer of other part of right lower leg with other specified severity Non-pressure chronic ulcer of other part of left lower leg with unspecified severity Disruption of external operation (surgical) wound, not elsewhere classified, initial encounter Procedures Wound #2 Pre-procedure diagnosis of Wound #2 is a Diabetic Wound/Ulcer of the Lower Extremity located on the Left Calcaneus .Severity of Tissue Pre Debridement is: Fat layer exposed. There was a Selective/Open Wound Skin/Epidermis Debridement with a total area of 0.77 sq cm performed by Ricard Dillon., MD. With  the following instrument(s): Curette to remove Non-Viable tissue/material. Material removed includes Callus and Skin: Epidermis and. No specimens were taken. A time out was conducted at 14:13, prior to the start of the procedure. There was no bleeding. The procedure was tolerated well with a pain level of 0 throughout and a pain level of 0 following the procedure. Post Debridement Measurements: 0.7cm length x 1.1cm width x 0.1cm depth; 0.06cm^3 volume. Character of Wound/Ulcer Post  Debridement is improved. Severity of Tissue Post Debridement is: Fat layer exposed. Post procedure Diagnosis Wound #2: Same as Pre-Procedure Plan Follow-up Appointments: Return appointment in 3 weeks. Bathing/ Shower/ Hygiene: May shower with protection but do not get wound dressing(s) wet. Edema Control - Lymphedema / SCD / Other: Elevate legs to the level of the heart or above for 30 minutes daily and/or when sitting, a frequency of: - throughout the day Avoid standing for long periods of time. Exercise regularly Home Health: No change in wound care orders this week; continue Home Health for wound care. May utilize formulary equivalent dressing for wound treatment orders unless otherwise specified. Other Home Health Orders/Instructions: - [facility] WOUND #2: - Calcaneus Wound Laterality: Left Cleanser: Normal Saline (Home Health) 2 x Per Week/30 Days Discharge Instructions: Cleanse the wound with Normal Saline or wound cleanser prior to applying a clean dressing using gauze sponges, not tissue or cotton balls. Peri-Wound Care: Sween Lotion (Moisturizing lotion) (Home Health) 2 x Per Week/30 Days Discharge Instructions: Apply moisturizing lotion or equivalent moisterizing lotion to legs under wraps Prim Dressing: Optifoam Non-Adhesive Dressing, 4x4 in (Home Health) 2 x Per Week/30 Days ary Discharge Instructions: Apply foam or heel cup to heel for proection pr Secondary Dressing: ALLEVYN Heel 4 1/2in x 5 1/2in / 10.5cm x 13.5cm (Home Health) 2 x Per Week/30 Days Discharge Instructions: Apply over primary dressing as directed. Com pression Wrap: Kerlix Roll 4.5x3.1 (in/yd) 2 x Per Week/30 Days Discharge Instructions: Apply Kerlix and Coban compression , wrap from base of toes to knee Com pression Wrap: Coban Self-Adherent Wrap 4x5 (in/yd) 2 x Per Week/30 Days Discharge Instructions: Apply over Kerlix as directed. WOUND #4: - Lower Leg Wound Laterality: Right, Anterior, Proximal Cleanser:  Normal Saline (Home Health) 2 x Per Week/30 Days Discharge Instructions: Cleanse the wound with Normal Saline prior or wound cleanser to applying a clean dressing using gauze sponges, not tissue or cotton balls. Peri-Wound Care: Sween Lotion (Moisturizing lotion) (Home Health) 2 x Per Week/30 Days Discharge Instructions: Apply moisturizing lotion or equivalent moisterizing lotion to legs under wraps Prim Dressing: IODOFLEX 0.9% Cadexomer Iodine Pad 4x6 cm (Home Health) 2 x Per Week/30 Days ary Discharge Instructions: Apply Iodoflex or Iodosorb to wound bed as instructed Secondary Dressing: Woven Gauze Sponge, Non-Sterile 4x4 in (Home Health) 2 x Per Week/30 Days Discharge Instructions: Apply over primary dressing as directed. Com pression Wrap: Kerlix Roll 4.5x3.1 (in/yd) 2 x Per Week/30 Days Discharge Instructions: Apply Kerlix and Coban compression , wrap from base of toes to knee Com pression Wrap: Coban Self-Adherent Wrap 4x5 (in/yd) 2 x Per Week/30 Days Discharge Instructions: Apply over Kerlix as directed. WOUND #5: - Lower Leg Wound Laterality: Left, Medial Cleanser: Normal Saline (Home Health) 2 x Per Week/30 Days Discharge Instructions: Cleanse the wound with Normal Saline or wound cleanser prior to applying a clean dressing using gauze sponges, not tissue or cotton balls. Peri-Wound Care: Sween Lotion (Moisturizing lotion) (Home Health) 2 x Per Week/30 Days Discharge Instructions: Apply moisturizing lotion or equivalent moisterizing lotion  to legs under wraps Prim Dressing: IODOFLEX 0.9% Cadexomer Iodine Pad 4x6 cm (Home Health) 2 x Per Week/30 Days ary Discharge Instructions: Apply Iodoflex or Iodosorb to wound bed as instructed Secondary Dressing: Woven Gauze Sponge, Non-Sterile 4x4 in (Home Health) 2 x Per Week/30 Days Discharge Instructions: Apply over primary dressing as directed. Compression Wrap: Kerlix Roll 4.5x3.1 (in/yd) 2 x Per Week/30 Days Discharge Instructions: Apply  Kerlix and Coban compression , wrap from base of toes to knee Compression Wrap: Coban Self-Adherent Wrap 4x5 (in/yd) 2 x Per Week/30 Days Discharge Instructions: Apply over Kerlix as directed. 1. I am continue with Iodoflex under compression to all wound areas 2. I think the heel just requires a border foam under the compression this should be healed by the next time we see him. Electronic Signature(s) Signed: 12/11/2020 4:58:57 PM By: Linton Ham MD Entered By: Linton Ham on 12/11/2020 14:24:38 -------------------------------------------------------------------------------- SuperBill Details Patient Name: Date of Service: Andrew [name], [name] 12/11/2020 Medical Record Number: [id number] Patient Account Number: [id number] Date of Birth/Sex: Treating RN: 1928/05/05 (86 y.o. Janyth Contes Primary Care Provider: Dimas Chyle Other Clinician: Referring Provider: Treating Provider/Extender: Donalee Citrin in Treatment: 13 Diagnosis Coding ICD-10 Codes Code Description E11.621 Type 2 diabetes mellitus with foot ulcer L97.422 Non-pressure chronic ulcer of left heel and midfoot with fat layer exposed I87.333 Chronic venous hypertension (idiopathic) with ulcer and inflammation of bilateral lower extremity L97.811 Non-pressure chronic ulcer of other part of right lower leg limited to breakdown of skin L97.818 Non-pressure chronic ulcer of other part of right lower leg with other specified severity L97.829 Non-pressure chronic ulcer of other part of left lower leg with unspecified severity T81.31XA Disruption of external operation (surgical) wound, not elsewhere classified, initial encounter Facility Procedures CPT4 Code: [id number] Description: [phone] - DEBRIDE WOUND 1ST 20 SQ CM OR < ICD-10 Diagnosis Description L97.422 Non-pressure chronic ulcer of left heel and midfoot with fat layer exposed Modifier: Quantity: 1 Physician Procedures : CPT4 Code  Description Modifier [id number] - WC PHYS DEBR WO ANESTH 20 SQ CM ICD-10 Diagnosis Description L97.422 Non-pressure chronic ulcer of left heel and midfoot with fat layer exposed Quantity: 1 Electronic Signature(s) Signed: 12/11/2020 4:58:57 PM By: Linton Ham MD Entered By: Linton Ham on 12/11/2020 14:25:06

## 2020-12-15 NOTE — Progress Notes (Signed)
Andrew [name], Andrew [name] ([id number]) Visit Report for 12/11/2020 Arrival Information Details Patient Name: Date of Service: Andrew [name], Andrew RGE A. 12/11/2020 1:30 PM Medical Record Number: [id number] Patient Account Number: [id number] Date of Birth/Sex: Treating RN: 05-Nov-1928 (86 y.o. Janyth Contes Primary Care Elke Holtry: Dimas Chyle Other Clinician: Referring Shaquoia Miers: Treating Kahealani Yankovich/Extender: Donalee Citrin in Treatment: 82 Visit Information History Since Last Visit Added or deleted any medications: No Patient Arrived: Wheel Chair Any new allergies or adverse reactions: No Arrival Time: 13:40 Had a fall or experienced change in No Accompanied By: wife activities of daily living that may affect Transfer Assistance: None risk of falls: Patient Identification Verified: Yes Signs or symptoms of abuse/neglect since last visito No Secondary Verification Process Completed: Yes Hospitalized since last visit: No Patient Requires Transmission-Based Precautions: No Implantable device outside of the clinic excluding No Patient Has Alerts: No cellular tissue based products placed in the center since last visit: Has Dressing in Place as Prescribed: Yes Pain Present Now: No Electronic Signature(s) Signed: 12/15/2020 2:20:57 PM By: Sandre Kitty Entered By: Sandre Kitty on 12/11/2020 13:42:20 -------------------------------------------------------------------------------- Encounter Discharge Information Details Patient Name: Date of Service: Andrew [name], Andrew RGE A. 12/11/2020 1:30 PM Medical Record Number: [id number] Patient Account Number: [id number] Date of Birth/Sex: Treating RN: 01-01-1928 (86 y.o. Erie Noe Primary Care Pike Scantlebury: Dimas Chyle Other Clinician: Referring Ayzia Day: Treating Jaquala Fuller/Extender: Donalee Citrin in Treatment: 13 Encounter Discharge Information Items Post Procedure Vitals Discharge Condition:  Stable Temperature (F): 97.8 Ambulatory Status: Ambulatory Pulse (bpm): 60 Discharge Destination: Home Respiratory Rate (breaths/min): 18 Transportation: Private Auto Blood Pressure (mmHg): 117/63 Accompanied By: self Schedule Follow-up Appointment: Yes Clinical Summary of Care: Patient Declined Electronic Signature(s) Signed: 12/11/2020 5:28:37 PM By: Rhae Hammock RN Entered By: Rhae Hammock on 12/11/2020 14:35:01 -------------------------------------------------------------------------------- Lower Extremity Assessment Details Patient Name: Date of Service: Andrew [name], Andrew RGE A. 12/11/2020 1:30 PM Medical Record Number: [id number] Patient Account Number: [id number] Date of Birth/Sex: Treating RN: [birth year] (86 y.o. [name] Primary Care Dahlila Pfahler: Dimas Chyle Other Clinician: Referring Duwayne Matters: Treating Denaja Verhoeven/Extender: Donalee Citrin in Treatment: 13 Edema Assessment Assessed: Shirlyn Goltz: Yes] Patrice Paradise: Yes] Edema: [Left: No] [Right: No] Calf Left: Right: Point of Measurement: 44 cm From Medial Instep 31 cm 31 cm Ankle Left: Right: Point of Measurement: 10 cm From Medial Instep 20 cm 21.5 cm Vascular Assessment Pulses: Dorsalis Pedis Palpable: [Left:Yes] [Right:Yes] Electronic Signature(s) Signed: 12/11/2020 5:32:58 PM By: Deon Pilling Entered By: Deon Pilling on 12/11/2020 14:06:54 -------------------------------------------------------------------------------- Multi Wound Chart Details Patient Name: Date of Service: Andrew [name], Andrew RGE A. 12/11/2020 1:30 PM Medical Record Number: [id number] Patient Account Number: [id number] Date of Birth/Sex: Treating RN: Apr 27, 1928 (86 y.o. Janyth Contes Primary Care Aarianna Hoadley: Dimas Chyle Other Clinician: Referring Signora Zucco: Treating Yulian Gosney/Extender: Donalee Citrin in Treatment: 13 Vital Signs Height(in): 32 Pulse(bpm): 60 Weight(lbs): 185 Blood  Pressure(mmHg): 117/63 Body Mass Index(BMI): 29 Temperature(F): 97.8 Respiratory Rate(breaths/min): 18 Photos: [2:No Photos Left Calcaneus] [4:No Photos Right, Proximal, Anterior Lower Leg] [5:No Photos Left, Medial Lower Leg] Wound Location: [2:Gradually Appeared] [4:Gradually Appeared] [5:Gradually Appeared] Wounding Event: [2:Diabetic Wound/Ulcer of the Lower] [4:Venous Leg Ulcer] [5:Diabetic Wound/Ulcer of the Lower] Primary Etiology: [2:Extremity Congestive Heart Failure, Coronary] [4:Congestive Heart Failure, Coronary Congestive Heart Failure, Coronary] [5:Extremity] Comorbid History: [2:Artery Disease, Hypertension, Peripheral Arterial Disease, Peripheral Venous Disease, Type II Diabetes 05/26/2020] [4:Artery Disease, Hypertension, Peripheral Arterial Disease, Peripheral Peripheral Arterial Disease, Peripheral Venous  Disease, Type II Diabetes 10/09/2020] [5:Artery  Disease, Hypertension, Venous Disease, Type II Diabetes 10/23/2020] Date Acquired: [2:13] [4:9] [5:7] Weeks of Treatment: [2:Open] [4:Open] [5:Open] Wound Status: [2:0.7x1.1x0.1] [4:1.9x1.4x0.1] [5:1x0.7x0.1] Measurements L x W x D (cm) [2:0.605] [4:2.089] [5:0.55] A (cm) : rea [2:0.06] [4:0.209] [5:0.055] Volume (cm) : [2:90.90%] [4:-2122.30%] [5:-45.90%] % Reduction in Area: [2:91.00%] [4:-2222.20%] [5:-44.70%] % Reduction in Volume: [2:Grade 2] [4:Full Thickness Without Exposed] [5:Grade 2] Classification: [2:None Present] [4:Support Structures Medium] [5:Medium] Exudate A mount: [2:N/A] [4:Serosanguineous] [5:Serosanguineous] Exudate Type: [2:N/A] [4:red, brown] [5:red, brown] Exudate Color: [2:Flat and Intact] [4:Flat and Intact] [5:Distinct, outline attached] Wound Margin: [2:None Present (0%)] [4:Medium (34-66%)] [5:Large (67-100%)] Granulation A mount: [2:N/A] [4:Red] [5:Red] Granulation Quality: [2:None Present (0%)] [4:Medium (34-66%)] [5:None Present (0%)] Necrotic A mount: [2:Fat Layer (Subcutaneous Tissue):  Yes Fat Layer (Subcutaneous Tissue): Yes Fat Layer (Subcutaneous Tissue): Yes] Exposed Structures: [2:Fascia: No Tendon: No Muscle: No Joint: No Bone: No Medium (34-66%)] [4:Fascia: No Tendon: No Muscle: No Joint: No Bone: No Small (1-33%)] [5:Fascia: No Tendon: No Muscle: No Joint: No Bone: No Large (67-100%)] Epithelialization: [2:Debridement - Selective/Open Wound N/A] [5:N/A] Debridement: Pre-procedure Verification/Time Out 14:13 [4:N/A] [5:N/A] Taken: [2:Callus] [4:N/A] [5:N/A] Tissue Debrided: [2:Skin/Epidermis] [4:N/A] [5:N/A] Level: [2:0.77] [4:N/A] [5:N/A] Debridement A (sq cm): [2:rea Curette] [4:N/A] [5:N/A] Instrument: [2:None] [4:N/A] [5:N/A] Bleeding: [2:Pressure] [4:N/A] [5:N/A] Hemostasis A chieved: [2:0] [4:N/A] [5:N/A] Procedural Pain: [2:0] [4:N/A] [5:N/A] Post Procedural Pain: [2:Procedure was tolerated well] [4:N/A] [5:N/A] Debridement Treatment Response: [2:0.7x1.1x0.1] [4:N/A] [5:N/A] Post Debridement Measurements L x W x D (cm) [2:0.06] [4:N/A] [5:N/A] Post Debridement Volume: (cm) [2:callous over the wound bed.] [4:N/A] [5:N/A] Assessment Notes: [2:Debridement] [4:N/A] [5:N/A] Treatment Notes Electronic Signature(s) Signed: 12/11/2020 4:58:57 PM By: Linton Ham MD Signed: 12/11/2020 5:46:53 PM By: Levan Hurst RN, BSN Entered By: Linton Ham on 12/11/2020 14:20:33 -------------------------------------------------------------------------------- Multi-Disciplinary Care Plan Details Patient Name: Date of Service: Andrew [name], Andrew RGE A. 12/11/2020 1:30 PM Medical Record Number: [id number] Patient Account Number: [id number] Date of Birth/Sex: Treating RN: [birth year] (86 y.o. Janyth Contes Primary Care Kayline Sheer: Dimas Chyle Other Clinician: Referring Breindel Collier: Treating Marlisa Caridi/Extender: Donalee Citrin in Treatment: 13 Active Inactive Pain, Acute or Chronic Nursing Diagnoses: Pain, acute or chronic: actual or  potential Goals: Patient/caregiver will verbalize adequate pain control between visits Date Initiated: 09/11/2020 Target Resolution Date: 12/18/2020 Goal Status: Active Interventions: Provide education on pain management Notes: Wound/Skin Impairment Nursing Diagnoses: Impaired tissue integrity Goals: Patient/caregiver will verbalize understanding of skin care regimen Date Initiated: 10/23/2020 Target Resolution Date: 12/18/2020 Goal Status: Active Ulcer/skin breakdown will have a volume reduction of 50% by week 8 Date Initiated: 09/11/2020 Date Inactivated: 10/23/2020 Target Resolution Date: 10/16/2020 Goal Status: Unmet Unmet Reason: PAD Interventions: Provide education on ulcer and skin care Notes: Electronic Signature(s) Signed: 12/11/2020 5:46:53 PM By: Levan Hurst RN, BSN Entered By: Levan Hurst on 12/11/2020 17:42:09 -------------------------------------------------------------------------------- Pain Assessment Details Patient Name: Date of Service: Andrew [name], Andrew RGE A. 12/11/2020 1:30 PM Medical Record Number: [id number] Patient Account Number: [id number] Date of Birth/Sex: Treating RN: 1928-07-12 (86 y.o. Janyth Contes Primary Care Eleftheria Taborn: Dimas Chyle Other Clinician: Referring Slaton Reaser: Treating Davaun Quintela/Extender: Donalee Citrin in Treatment: 13 Active Problems Location of Pain Severity and Description of Pain Patient Has Paino No Site Locations Pain Management and Medication Current Pain Management: Electronic Signature(s) Signed: 12/11/2020 5:46:53 PM By: Levan Hurst RN, BSN Signed: 12/15/2020 2:20:57 PM By: Sandre Kitty Entered By: Sandre Kitty on 12/11/2020 13:42:45 -------------------------------------------------------------------------------- Patient/Caregiver Education Details Patient Name: Date of Service: Andrew [name], Andrew RGE A.  12/17/2021andnbsp1:30 PM Medical Record Number: [id number] Patient  Account Number: [id number] Date of Birth/Gender: Treating RN: 1928/07/22 (86 y.o. Janyth Contes Primary Care Physician: Dimas Chyle Other Clinician: Referring Physician: Treating Physician/Extender: Donalee Citrin in Treatment: 13 Education Assessment Education Provided To: Patient Education Topics Provided Wound/Skin Impairment: Methods: Explain/Verbal Responses: State content correctly [employer]) Signed: 12/11/2020 5:46:53 PM By: Levan Hurst RN, BSN Entered By: Levan Hurst on 12/11/2020 17:42:18 -------------------------------------------------------------------------------- Wound Assessment Details Patient Name: Date of Service: Andrew [name], Andrew RGE A. 12/11/2020 1:30 PM Medical Record Number: [id number] Patient Account Number: [id number] Date of Birth/Sex: Treating RN: [birth year] (86 y.o. Janyth Contes Primary Care Armen Waring: Dimas Chyle Other Clinician: Referring Neftaly Inzunza: Treating Tyrece Vanterpool/Extender: Donalee Citrin in Treatment: 13 Wound Status Wound Number: 2 Primary Diabetic Wound/Ulcer of the Lower Extremity Etiology: Wound Location: Left Calcaneus Wound Open Wounding Event: Gradually Appeared Status: Date Acquired: 05/26/2020 Comorbid Congestive Heart Failure, Coronary Artery Disease, Hypertension, Weeks Of Treatment: 13 History: Peripheral Arterial Disease, Peripheral Venous Disease, Type II Clustered Wound: No Diabetes Photos Photo Uploaded By: Mikeal Hawthorne on 12/15/2020 10:48:30 Wound Measurements Length: (cm) 0.7 Width: (cm) 1.1 Depth: (cm) 0.1 Area: (cm) 0.605 Volume: (cm) 0.06 % Reduction in Area: 90.9% % Reduction in Volume: 91% Epithelialization: Medium (34-66%) Tunneling: No Undermining: No Wound Description Classification: Grade 2 Wound Margin: Flat and Intact Exudate Amount: None Present Foul Odor After Cleansing: No Slough/Fibrino Yes Wound Bed Granulation Amount:  None Present (0%) Exposed Structure Necrotic Amount: None Present (0%) Fascia Exposed: No Fat Layer (Subcutaneous Tissue) Exposed: Yes Tendon Exposed: No Muscle Exposed: No Joint Exposed: No Bone Exposed: No Assessment Notes callous over the wound bed. Treatment Notes Wound #2 (Calcaneus) Wound Laterality: Left Cleanser Normal Saline Discharge Instruction: Cleanse the wound with Normal Saline or wound cleanser prior to applying a clean dressing using gauze sponges, not tissue or cotton balls. Peri-Wound Care Sween Lotion (Moisturizing lotion) Discharge Instruction: Apply moisturizing lotion or equivalent moisterizing lotion to legs under wraps Topical Primary Dressing Optifoam Non-Adhesive Dressing, 4x4 in Discharge Instruction: Apply foam or heel cup to heel for proection pr Secondary Dressing ALLEVYN Heel 4 1/2in x 5 1/2in / 10.5cm x 13.5cm Discharge Instruction: Apply over primary dressing as directed. Secured With Compression Wrap Kerlix Roll 4.5x3.1 (in/yd) Discharge Instruction: Apply Kerlix and Coban compression , wrap from base of toes to knee Coban Self-Adherent Wrap 4x5 (in/yd) Discharge Instruction: Apply over Kerlix as directed. Compression Stockings Add-Ons Electronic Signature(s) Signed: 12/11/2020 5:32:58 PM By: Deon Pilling Signed: 12/11/2020 5:46:53 PM By: Levan Hurst RN, BSN Entered By: Deon Pilling on 12/11/2020 14:07:30 -------------------------------------------------------------------------------- Wound Assessment Details Patient Name: Date of Service: Andrew [name], Andrew RGE A. 12/11/2020 1:30 PM Medical Record Number: [id number] Patient Account Number: [id number] Date of Birth/Sex: Treating RN: 08-Aug-1928 (86 y.o. Janyth Contes Primary Care Jude Linck: Dimas Chyle Other Clinician: Referring Logen Heintzelman: Treating Sebastion Jun/Extender: Donalee Citrin in Treatment: 13 Wound Status Wound Number: 4 Primary Venous Leg  Ulcer Etiology: Wound Location: Right, Proximal, Anterior Lower Leg Wound Open Wounding Event: Gradually Appeared Status: Date Acquired: 10/09/2020 Comorbid Congestive Heart Failure, Coronary Artery Disease, Hypertension, Weeks Of Treatment: 9 History: Peripheral Arterial Disease, Peripheral Venous Disease, Type II Clustered Wound: No Diabetes Photos Photo Uploaded By: Mikeal Hawthorne on 12/15/2020 10:48:09 Wound Measurements Length: (cm) 1.9 Width: (cm) 1.4 Depth: (cm) 0.1 Area: (cm) 2.089 Volume: (cm) 0.209 % Reduction in Area: -2122.3% % Reduction in Volume: -2222.2% Epithelialization: Small (1-33%) Tunneling: No Undermining: No  Wound Description Classification: Full Thickness Without Exposed Support Structures Wound Margin: Flat and Intact Exudate Amount: Medium Exudate Type: Serosanguineous Exudate Color: red, brown Foul Odor After Cleansing: No Slough/Fibrino Yes Wound Bed Granulation Amount: Medium (34-66%) Exposed Structure Granulation Quality: Red Fascia Exposed: No Necrotic Amount: Medium (34-66%) Fat Layer (Subcutaneous Tissue) Exposed: Yes Necrotic Quality: Adherent Slough Tendon Exposed: No Muscle Exposed: No Joint Exposed: No Bone Exposed: No Treatment Notes Wound #4 (Lower Leg) Wound Laterality: Right, Anterior, Proximal Cleanser Normal Saline Discharge Instruction: Cleanse the wound with Normal Saline prior or wound cleanser to applying a clean dressing using gauze sponges, not tissue or cotton balls. Peri-Wound Care Sween Lotion (Moisturizing lotion) Discharge Instruction: Apply moisturizing lotion or equivalent moisterizing lotion to legs under wraps Topical Primary Dressing IODOFLEX 0.9% Cadexomer Iodine Pad 4x6 cm Discharge Instruction: Apply Iodoflex or Iodosorb to wound bed as instructed Secondary Dressing Woven Gauze Sponge, Non-Sterile 4x4 in Discharge Instruction: Apply over primary dressing as directed. Secured With Compression  Wrap Kerlix Roll 4.5x3.1 (in/yd) Discharge Instruction: Apply Kerlix and Coban compression , wrap from base of toes to knee Coban Self-Adherent Wrap 4x5 (in/yd) Discharge Instruction: Apply over Kerlix as directed. Compression Stockings Add-Ons Electronic Signature(s) Signed: 12/11/2020 5:32:58 PM By: Deon Pilling Signed: 12/11/2020 5:46:53 PM By: Levan Hurst RN, BSN Entered By: Deon Pilling on 12/11/2020 14:07:48 -------------------------------------------------------------------------------- Wound Assessment Details Patient Name: Date of Service: Andrew [name], Andrew RGE A. 12/11/2020 1:30 PM Medical Record Number: [id number] Patient Account Number: [id number] Date of Birth/Sex: Treating RN: 1928-07-26 (86 y.o. Janyth Contes Primary Care Jessamine Barcia: Dimas Chyle Other Clinician: Referring Torris House: Treating Wymon Swaney/Extender: Donalee Citrin in Treatment: 13 Wound Status Wound Number: 5 Primary Diabetic Wound/Ulcer of the Lower Extremity Etiology: Wound Location: Left, Medial Lower Leg Wound Open Wounding Event: Gradually Appeared Status: Date Acquired: 10/23/2020 Comorbid Congestive Heart Failure, Coronary Artery Disease, Hypertension, Weeks Of Treatment: 7 History: Peripheral Arterial Disease, Peripheral Venous Disease, Type II Clustered Wound: No Diabetes Photos Photo Uploaded By: Mikeal Hawthorne on 12/15/2020 10:48:30 Wound Measurements Length: (cm) 1 Width: (cm) 0.7 Depth: (cm) 0.1 Area: (cm) 0.55 Volume: (cm) 0.055 % Reduction in Area: -45.9% % Reduction in Volume: -44.7% Epithelialization: Large (67-100%) Tunneling: No Undermining: No Wound Description Classification: Grade 2 Wound Margin: Distinct, outline attached Exudate Amount: Medium Exudate Type: Serosanguineous Exudate Color: red, brown Foul Odor After Cleansing: No Slough/Fibrino Yes Wound Bed Granulation Amount: Large (67-100%) Exposed Structure Granulation Quality:  Red Fascia Exposed: No Necrotic Amount: None Present (0%) Fat Layer (Subcutaneous Tissue) Exposed: Yes Tendon Exposed: No Muscle Exposed: No Joint Exposed: No Bone Exposed: No Treatment Notes Wound #5 (Lower Leg) Wound Laterality: Left, Medial Cleanser Normal Saline Discharge Instruction: Cleanse the wound with Normal Saline or wound cleanser prior to applying a clean dressing using gauze sponges, not tissue or cotton balls. Peri-Wound Care Sween Lotion (Moisturizing lotion) Discharge Instruction: Apply moisturizing lotion or equivalent moisterizing lotion to legs under wraps Topical Primary Dressing IODOFLEX 0.9% Cadexomer Iodine Pad 4x6 cm Discharge Instruction: Apply Iodoflex or Iodosorb to wound bed as instructed Secondary Dressing Woven Gauze Sponge, Non-Sterile 4x4 in Discharge Instruction: Apply over primary dressing as directed. Secured With Compression Wrap Kerlix Roll 4.5x3.1 (in/yd) Discharge Instruction: Apply Kerlix and Coban compression , wrap from base of toes to knee Coban Self-Adherent Wrap 4x5 (in/yd) Discharge Instruction: Apply over Kerlix as directed. Compression Stockings Add-Ons Electronic Signature(s) Signed: 12/11/2020 5:32:58 PM By: Deon Pilling Signed: 12/11/2020 5:46:53 PM By: Levan Hurst RN, BSN Entered By: Rolin Barry  Bobbi on 12/11/2020 14:08:02 -------------------------------------------------------------------------------- Vitals Details Patient Name: Date of Service: Andrew [name], Andrew RGE A. 12/11/2020 1:30 PM Medical Record Number: [id number] Patient Account Number: [id number] Date of Birth/Sex: Treating RN: [birth year] (86 y.o. Janyth Contes Primary Care Patton Rabinovich: Dimas Chyle Other Clinician: Referring Luvinia Lucy: Treating Kieren Ricci/Extender: Donalee Citrin in Treatment: 13 Vital Signs Time Taken: 14:42 Temperature (F): 97.8 Height (in): 67 Pulse (bpm): 60 Weight (lbs): 185 Respiratory Rate (breaths/min):  18 Body Mass Index (BMI): 29 Blood Pressure (mmHg): 117/63 Reference Range: 80 - 120 mg / dl Electronic Signature(s) Signed: 12/15/2020 2:20:57 PM By: Sandre Kitty Signed: 12/15/2020 2:20:57 PM By: Sandre Kitty Entered By: Sandre Kitty on 12/11/2020 13:42:38

## 2020-12-16 DIAGNOSIS — N184 Chronic kidney disease, stage 4 (severe): Secondary | ICD-10-CM | POA: Diagnosis not present

## 2020-12-16 DIAGNOSIS — J449 Chronic obstructive pulmonary disease, unspecified: Secondary | ICD-10-CM | POA: Diagnosis not present

## 2020-12-16 DIAGNOSIS — J9601 Acute respiratory failure with hypoxia: Secondary | ICD-10-CM | POA: Diagnosis not present

## 2020-12-16 DIAGNOSIS — I5022 Chronic systolic (congestive) heart failure: Secondary | ICD-10-CM | POA: Diagnosis not present

## 2020-12-16 DIAGNOSIS — I872 Venous insufficiency (chronic) (peripheral): Secondary | ICD-10-CM | POA: Diagnosis not present

## 2020-12-16 DIAGNOSIS — E1169 Type 2 diabetes mellitus with other specified complication: Secondary | ICD-10-CM | POA: Diagnosis not present

## 2020-12-16 DIAGNOSIS — I130 Hypertensive heart and chronic kidney disease with heart failure and stage 1 through stage 4 chronic kidney disease, or unspecified chronic kidney disease: Secondary | ICD-10-CM | POA: Diagnosis not present

## 2020-12-16 DIAGNOSIS — L89620 Pressure ulcer of left heel, unstageable: Secondary | ICD-10-CM | POA: Diagnosis not present

## 2020-12-16 DIAGNOSIS — D631 Anemia in chronic kidney disease: Secondary | ICD-10-CM | POA: Diagnosis not present

## 2020-12-16 DIAGNOSIS — E1151 Type 2 diabetes mellitus with diabetic peripheral angiopathy without gangrene: Secondary | ICD-10-CM | POA: Diagnosis not present

## 2020-12-16 DIAGNOSIS — J9602 Acute respiratory failure with hypercapnia: Secondary | ICD-10-CM | POA: Diagnosis not present

## 2020-12-16 DIAGNOSIS — E1122 Type 2 diabetes mellitus with diabetic chronic kidney disease: Secondary | ICD-10-CM | POA: Diagnosis not present

## 2020-12-21 DIAGNOSIS — L89620 Pressure ulcer of left heel, unstageable: Secondary | ICD-10-CM | POA: Diagnosis not present

## 2020-12-21 DIAGNOSIS — I5022 Chronic systolic (congestive) heart failure: Secondary | ICD-10-CM | POA: Diagnosis not present

## 2020-12-21 DIAGNOSIS — I872 Venous insufficiency (chronic) (peripheral): Secondary | ICD-10-CM | POA: Diagnosis not present

## 2020-12-21 DIAGNOSIS — E1151 Type 2 diabetes mellitus with diabetic peripheral angiopathy without gangrene: Secondary | ICD-10-CM | POA: Diagnosis not present

## 2020-12-21 DIAGNOSIS — E1169 Type 2 diabetes mellitus with other specified complication: Secondary | ICD-10-CM | POA: Diagnosis not present

## 2020-12-21 DIAGNOSIS — E1122 Type 2 diabetes mellitus with diabetic chronic kidney disease: Secondary | ICD-10-CM | POA: Diagnosis not present

## 2020-12-21 DIAGNOSIS — I130 Hypertensive heart and chronic kidney disease with heart failure and stage 1 through stage 4 chronic kidney disease, or unspecified chronic kidney disease: Secondary | ICD-10-CM | POA: Diagnosis not present

## 2020-12-21 DIAGNOSIS — D631 Anemia in chronic kidney disease: Secondary | ICD-10-CM | POA: Diagnosis not present

## 2020-12-21 DIAGNOSIS — N184 Chronic kidney disease, stage 4 (severe): Secondary | ICD-10-CM | POA: Diagnosis not present

## 2020-12-24 ENCOUNTER — Other Ambulatory Visit (HOSPITAL_COMMUNITY): Payer: Self-pay

## 2020-12-24 NOTE — Progress Notes (Signed)
Paramedicine Encounter    Patient ID: Andrew [name], male    DOB: 20-Jul-1928, 86 y.o.   MRN: [id number]   Patient Care Team: [name], MD as PCP - General (Family Medicine) Larey Dresser, MD as PCP - Advanced Heart Failure (Cardiology) Larey Dresser, MD as Consulting Physician (Cardiology) Jorge Ny, LCSW as Social Worker (Licensed Clinical Social Worker) Gardiner Barefoot, DPM as Consulting Physician (Podiatry) [name], MD (Family Medicine) Patient, No Pcp Per (General Practice)  Patient Active Problem List   Diagnosis Date Noted  . Acute respiratory failure with hypercapnia ([place]) 06/02/2020  . Acute metabolic encephalopathy 09/32/6712  . CKD (chronic kidney disease), stage IV ([place]) 06/02/2020  . PAF (paroxysmal atrial fibrillation) ([place]) 06/02/2020  . Hypothyroidism 06/02/2020  . Chronic anticoagulation 06/02/2020  . Debility 06/01/2020  . Lumbar spondylosis 10/04/2018  . Chronic respiratory failure with hypoxia ([place]) 09/16/2018  . Pain of right thumb 06/19/2018  . Bilateral recurrent inguinal hernias 11/28/2017  . Carotid artery stenosis 01/12/2016  . Chronic systolic CHF (congestive heart failure) ([place]) 12/02/2015  . 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 ([place]) 10/16/2015  . PVD (peripheral vascular disease) ([place])   . Hereditary and idiopathic peripheral neuropathy 10/02/2015  . Peripelvic (lymphatic) cyst   . Pernicious anemia 07/15/2015  . Bilateral renal cysts 07/12/2015  . Lung nodule, 48mm RML CT 07/12/15 07/12/2015  . Stage 3 chronic kidney disease ([place]) 06/14/2015  . Paroxysmal atrial fibrillation ([place]) 05/28/2015  . AAA (abdominal aortic aneurysm) without rupture ([place]) 09/30/2014  . Cardiomyopathy, ischemic 05/23/2014  . DM (diabetes mellitus), type 2 with renal complications ([place]) 45/80/9983  . Depression, major, single episode, complete remission ([place]) 03/18/2010  . Hypothyroidism 02/03/2009  . Osteoarthritis 05/27/2008  .  Hyperlipidemia associated with type 2 diabetes mellitus ([place]) 06/14/2007  . Hypertension associated with diabetes ([place]) 06/14/2007  . GERD 06/14/2007    Current Outpatient Medications:  .  acetaminophen (TYLENOL) 500 MG tablet, Take 2 tablets (1,000 mg total) by mouth every 8 (eight) hours as needed., Disp: 30 tablet, Rfl: 0 .  amiodarone (PACERONE) 200 MG tablet, Take 0.5 tablets (100 mg total) by mouth daily., Disp: 45 tablet, Rfl: 3 .  apixaban (ELIQUIS) 2.5 MG TABS tablet, Take 2.5 mg by mouth 2 (two) times daily. , Disp: , Rfl:  .  atorvastatin (LIPITOR) 80 MG tablet, Take 1 tablet (80 mg total) by mouth daily., Disp: 30 tablet, Rfl: 3 .  carvedilol (COREG) 12.5 MG tablet, Take 12.5 mg by mouth 2 (two) times daily with a meal., Disp: , Rfl:  .  cilostazol (PLETAL) 100 MG tablet, Take 1 tablet (100 mg total) by mouth 2 (two) times daily., Disp: 180 tablet, Rfl: 3 .  ezetimibe (ZETIA) 10 MG tablet, TAKE (1) TABLET DAILY., Disp: 90 tablet, Rfl: 0 .  FLUoxetine (PROZAC) 20 MG capsule, TAKE (1) CAPSULE DAILY., Disp: 90 capsule, Rfl: 0 .  gabapentin (NEURONTIN) 100 MG capsule, Take 100 mg by mouth 3 (three) times daily., Disp: , Rfl:  .  hydrALAZINE (APRESOLINE) 50 MG tablet, Take 1 tablet (50 mg total) by mouth 3 (three) times daily., Disp: 90 tablet, Rfl: 3 .  isosorbide mononitrate (IMDUR) 30 MG 24 hr tablet, Take 1 tablet (30 mg total) by mouth daily., Disp: 30 tablet, Rfl: 3 .  levothyroxine (SYNTHROID) 100 MCG tablet, TAKE 1 TABLET BEFORE BREAKFAST., Disp: 90 tablet, Rfl: 0 .  Multiple Vitamins-Minerals (MULTIVITAMIN ADULTS 50+) TABS, Take 1 tablet by mouth daily.,  Disp: , Rfl:  .  pantoprazole (PROTONIX) 40 MG tablet, Take 1 tablet (40 mg total) by mouth 2 (two) times daily., Disp: , Rfl:  .  potassium chloride SA (KLOR-CON) 20 MEQ tablet, Take 20 mEq by mouth daily., Disp: , Rfl:  .  tiotropium (SPIRIVA) 18 MCG inhalation capsule, Place 18 mcg into inhaler and inhale daily as needed (for  wheezing)., Disp: , Rfl:  .  torsemide (DEMADEX) 20 MG tablet, Take 4 tablets (80 mg total) by mouth 2 (two) times daily., Disp: 240 tablet, Rfl: 3 .  glucose blood (ACCU-CHEK AVIVA PLUS) test strip, CHECK BLOOD SUGAR 4 TIMES A DAY., Disp: 100 each, Rfl: 11 .  HYDROcodone-acetaminophen (NORCO/VICODIN) 5-325 MG tablet, TAKE 1 TABLET EVERY 6 HOURS AS NEEDED FOR MODERATE PAIN. (Patient not taking: No sig reported), Disp: 12 tablet, Rfl: 0 .  metolazone (ZAROXOLYN) 2.5 MG tablet, Take 1 tablet (2.5 mg total) by mouth as directed. By [facility] (Patient not taking: Reported on 12/24/2020), Disp: 3 tablet, Rfl: 0 .  nitroGLYCERIN (NITROSTAT) 0.4 MG SL tablet, DISSOLVE 1 TABLET UNDER TONGUE AS NEEDED FOR CHEST PAIN,MAY REPEAT IN5 MINUTES FOR 2 DOSES. (Patient not taking: No sig reported), Disp: 25 tablet, Rfl: 3 Allergies  Allergen Reactions  . Metoprolol Shortness Of Breath, Palpitations and Other (See Comments)    Heart starts racing. Shallow breathing; pt states he also get skin irritation on his legs  . Metformin Hives and Swelling    On legs  . Metformin And Related Nausea And Vomiting      Social History   Socioeconomic History  . Marital status: Married    Spouse name: Not on file  . Number of children: Not on file  . Years of education: Not on file  . Highest education level: Not on file  Occupational History  . Occupation: Retired  Tobacco Use  . Smoking status: Never Smoker  . Smokeless tobacco: Never Used  . Tobacco comment: quit atleast 25 yrs ago, per pt  Vaping Use  . Vaping Use: Never used  Substance and Sexual Activity  . Alcohol use: Never    Comment: socially  . Drug use: Never  . Sexual activity: Not Currently  Other Topics Concern  . Not on file  Social History Narrative   ** Merged History Encounter **       Pt lives in [place] with spouse. Retired from [employer].  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    Social Determinants of Health   Financial Resource Strain: Low Risk   . Difficulty of Paying Living Expenses: Not hard at all  Food Insecurity: No Food Insecurity  . Worried About Charity fundraiser in the Last Year: Never true  . Ran Out of Food in the Last Year: Never true  Transportation Needs: No Transportation Needs  . Lack of Transportation (Medical): No  . Lack of Transportation (Non-Medical): No  Physical Activity: Inactive  . Days of Exercise per Week: 0 days  . Minutes of Exercise per Session: 0 min  Stress: No Stress Concern Present  . Feeling of Stress : Not at all  Social Connections: Moderately Isolated  . Frequency of Communication with Friends and Family: More than three times a week  . Frequency of Social Gatherings with Friends and Family: More than three times a week  . Attends Religious Services: Never  . Active Member of Clubs or Organizations: No  . Attends Archivist Meetings:  Never  . Marital Status: Married  Human resources officer Violence: Not At Risk  . Fear of Current or Ex-Partner: No  . Emotionally Abused: No  . Physically Abused: No  . Sexually Abused: No    Physical Exam Cardiovascular:     Rate and Rhythm: Normal rate and regular rhythm.     Pulses: Normal pulses.  Pulmonary:     Effort: Pulmonary effort is normal.     Breath sounds: Normal breath sounds.  Musculoskeletal:        General: Normal range of motion.     Right lower leg: No edema.     Left lower leg: No edema.  Skin:    General: Skin is warm and dry.     Capillary Refill: Capillary refill takes less than 2 seconds.  Neurological:     Mental Status: He is alert and oriented to person, place, and time.  Psychiatric:        Mood and Affect: Mood normal.         Future Appointments  Date Time Provider [place]  01/01/2021  1:15 PM Ricard Dillon, MD [facility]  01/14/2021  1:40 PM Larey Dresser, MD [facility] None    BP 136/75 (BP Location:  Left Arm, Patient Position: Sitting, Cuff Size: Normal)   Pulse 60   Resp 16   Wt 164 lb 12.8 oz (74.8 kg)   SpO2 95%   BMI 25.81 kg/m   Weight yesterday- did not weigh Last visit weight- 174.6 lb  Andrew [name] was seen at home today and reported feeling generally well. He denied chest pain, SOB, headache, dizziness, orthopnea, fever or cough since our last visit. He stated he has been compliant with his medications over the past two weeks but his wife stated he has missed a few here and there. He continues to lose weight without trying but reports feeling "just fine." His medications were verified and his pillbox was refilled. I will follow up next week.   [name], EMT 12/24/20  ACTION: Home visit completed Next visit planned for 1 week

## 2020-12-30 DIAGNOSIS — L89620 Pressure ulcer of left heel, unstageable: Secondary | ICD-10-CM | POA: Diagnosis not present

## 2020-12-30 DIAGNOSIS — E1151 Type 2 diabetes mellitus with diabetic peripheral angiopathy without gangrene: Secondary | ICD-10-CM | POA: Diagnosis not present

## 2020-12-30 DIAGNOSIS — I5022 Chronic systolic (congestive) heart failure: Secondary | ICD-10-CM | POA: Diagnosis not present

## 2020-12-30 DIAGNOSIS — E1122 Type 2 diabetes mellitus with diabetic chronic kidney disease: Secondary | ICD-10-CM | POA: Diagnosis not present

## 2020-12-30 DIAGNOSIS — D631 Anemia in chronic kidney disease: Secondary | ICD-10-CM | POA: Diagnosis not present

## 2020-12-30 DIAGNOSIS — I130 Hypertensive heart and chronic kidney disease with heart failure and stage 1 through stage 4 chronic kidney disease, or unspecified chronic kidney disease: Secondary | ICD-10-CM | POA: Diagnosis not present

## 2020-12-30 DIAGNOSIS — E1169 Type 2 diabetes mellitus with other specified complication: Secondary | ICD-10-CM | POA: Diagnosis not present

## 2020-12-30 DIAGNOSIS — I872 Venous insufficiency (chronic) (peripheral): Secondary | ICD-10-CM | POA: Diagnosis not present

## 2020-12-30 DIAGNOSIS — N184 Chronic kidney disease, stage 4 (severe): Secondary | ICD-10-CM | POA: Diagnosis not present

## 2020-12-31 ENCOUNTER — Other Ambulatory Visit (HOSPITAL_COMMUNITY): Payer: Self-pay

## 2020-12-31 NOTE — Progress Notes (Signed)
Mr Biedermann was seen at home today and reported feeling well. He denied chest pain, SOB, headache, dizziness, orthopnea, fever or cough over the past week. He stated he has been compliant with his medications. His medications were verified and his pillbox was refilled. I will follow up next week.   [name], EMT 12/31/20

## 2021-01-01 ENCOUNTER — Other Ambulatory Visit: Payer: Self-pay

## 2021-01-01 ENCOUNTER — Encounter (HOSPITAL_BASED_OUTPATIENT_CLINIC_OR_DEPARTMENT_OTHER): Payer: Medicare PPO | Attending: Internal Medicine | Admitting: Internal Medicine

## 2021-01-01 DIAGNOSIS — E11621 Type 2 diabetes mellitus with foot ulcer: Secondary | ICD-10-CM | POA: Diagnosis not present

## 2021-01-01 DIAGNOSIS — L97811 Non-pressure chronic ulcer of other part of right lower leg limited to breakdown of skin: Secondary | ICD-10-CM | POA: Diagnosis not present

## 2021-01-01 DIAGNOSIS — I87333 Chronic venous hypertension (idiopathic) with ulcer and inflammation of bilateral lower extremity: Secondary | ICD-10-CM | POA: Diagnosis not present

## 2021-01-01 DIAGNOSIS — L97422 Non-pressure chronic ulcer of left heel and midfoot with fat layer exposed: Secondary | ICD-10-CM | POA: Insufficient documentation

## 2021-01-01 DIAGNOSIS — L97812 Non-pressure chronic ulcer of other part of right lower leg with fat layer exposed: Secondary | ICD-10-CM | POA: Diagnosis not present

## 2021-01-01 DIAGNOSIS — X58XXXA Exposure to other specified factors, initial encounter: Secondary | ICD-10-CM | POA: Insufficient documentation

## 2021-01-01 DIAGNOSIS — I872 Venous insufficiency (chronic) (peripheral): Secondary | ICD-10-CM | POA: Diagnosis not present

## 2021-01-01 DIAGNOSIS — L97818 Non-pressure chronic ulcer of other part of right lower leg with other specified severity: Secondary | ICD-10-CM | POA: Diagnosis not present

## 2021-01-01 DIAGNOSIS — T8131XA Disruption of external operation (surgical) wound, not elsewhere classified, initial encounter: Secondary | ICD-10-CM | POA: Diagnosis not present

## 2021-01-01 DIAGNOSIS — L97829 Non-pressure chronic ulcer of other part of left lower leg with unspecified severity: Secondary | ICD-10-CM | POA: Diagnosis not present

## 2021-01-01 DIAGNOSIS — L97429 Non-pressure chronic ulcer of left heel and midfoot with unspecified severity: Secondary | ICD-10-CM | POA: Diagnosis present

## 2021-01-01 DIAGNOSIS — E11622 Type 2 diabetes mellitus with other skin ulcer: Secondary | ICD-10-CM | POA: Diagnosis not present

## 2021-01-01 DIAGNOSIS — L97822 Non-pressure chronic ulcer of other part of left lower leg with fat layer exposed: Secondary | ICD-10-CM | POA: Diagnosis not present

## 2021-01-01 NOTE — Progress Notes (Signed)
LEVIS, NAZIR ([id number]) Visit Report for 01/01/2021 Debridement Details Patient Name: Date of Service: Andrew [name], Andrew RGE A. 01/01/2021 1:15 PM Medical Record Number: [id number] Patient Account Number: [id number] Date of Birth/Sex: Treating RN: 1928/11/23 (87 y.o. [name] Primary Care Provider: Dimas Chyle Other Clinician: Referring Provider: Treating Provider/Extender: Donalee Citrin in Treatment: 16 Debridement Performed for Assessment: Wound #4 Right,Proximal,Anterior Lower Leg Performed By: Physician Ricard Dillon., MD Debridement Type: Debridement Severity of Tissue Pre Debridement: Fat layer exposed Level of Consciousness (Pre-procedure): Awake and Alert Pre-procedure Verification/Time Out Yes - 13:45 Taken: Start Time: 13:47 Pain Control: Other : benzocaine 20% spray T Area Debrided (L x W): otal 1 (cm) x 0.7 (cm) = 0.7 (cm) Tissue and other material debrided: Viable, Non-Viable, Slough, Subcutaneous, Slough Level: Skin/Subcutaneous Tissue Debridement Description: Excisional Instrument: Curette Bleeding: Minimum Hemostasis Achieved: Pressure End Time: 13:49 Procedural Pain: 3 Post Procedural Pain: 1 Response to Treatment: Procedure was tolerated well Level of Consciousness (Post- Awake and Alert procedure): Post Debridement Measurements of Total Wound Length: (cm) 1 Width: (cm) 0.7 Depth: (cm) 0.1 Volume: (cm) 0.055 Character of Wound/Ulcer Post Debridement: Improved Severity of Tissue Post Debridement: Fat layer exposed Post Procedure Diagnosis Same as Pre-procedure Electronic Signature(s) Signed: 01/01/2021 4:46:42 PM By: Baruch Gouty RN, BSN Signed: 01/01/2021 4:48:18 PM By: Linton Ham MD Entered By: Linton Ham on 01/01/2021 14:01:46 -------------------------------------------------------------------------------- Debridement Details Patient Name: Date of Service: Andrew [name], Andrew RGE A. 01/01/2021 1:15  PM Medical Record Number: [id number] Patient Account Number: [id number] Date of Birth/Sex: Treating RN: 1928-08-16 (87 y.o. [name] Primary Care Provider: Dimas Chyle Other Clinician: Referring Provider: Treating Provider/Extender: Donalee Citrin in Treatment: 16 Debridement Performed for Assessment: Wound #5 Left,Medial Lower Leg Performed By: Physician Ricard Dillon., MD Debridement Type: Debridement Severity of Tissue Pre Debridement: Fat layer exposed Level of Consciousness (Pre-procedure): Awake and Alert Pre-procedure Verification/Time Out Yes - 13:45 Taken: Start Time: 13:47 Pain Control: Other : benzocaine 20% spray T Area Debrided (L x W): otal 1 (cm) x 1.3 (cm) = 1.3 (cm) Tissue and other material debrided: Viable, Non-Viable, Slough, Subcutaneous, Slough Level: Skin/Subcutaneous Tissue Debridement Description: Excisional Instrument: Curette Bleeding: Minimum Hemostasis Achieved: Pressure End Time: 13:49 Procedural Pain: 3 Post Procedural Pain: 1 Response to Treatment: Procedure was tolerated well Level of Consciousness (Post- Awake and Alert procedure): Post Debridement Measurements of Total Wound Length: (cm) 1 Width: (cm) 1.3 Depth: (cm) 0.1 Volume: (cm) 0.102 Character of Wound/Ulcer Post Debridement: Improved Severity of Tissue Post Debridement: Fat layer exposed Post Procedure Diagnosis Same as Pre-procedure Electronic Signature(s) Signed: 01/01/2021 4:46:42 PM By: Baruch Gouty RN, BSN Signed: 01/01/2021 4:48:18 PM By: Linton Ham MD Entered By: Linton Ham on 01/01/2021 14:01:59 -------------------------------------------------------------------------------- HPI Details Patient Name: Date of Service: Andrew [name], Andrew RGE A. 01/01/2021 1:15 PM Medical Record Number: [id number] Patient Account Number: [id number] Date of Birth/Sex: Treating RN: 1928-07-31 (87 y.o. [name] Primary Care Provider:  Dimas Chyle Other Clinician: Referring Provider: Treating Provider/Extender: Donalee Citrin in Treatment: 16 History of Present Illness HPI Description: 09/11/2020 ADMISSION This is a soon-to-be 87 year old man who was hospitalized in June and subsequently spent time at [place] home. At some point he developed a pressure ulcer on his left heel. On discharge he was seen by podiatry and referred to Dr. Donzetta Matters with regards to the possibility of PAD. The patient underwent angiography on 08/10/2020 this showed heavily calcified vessels. The common iliac artery  on the left appeared to have calcific disease but there was no gradient. Left common external leg arteries were heavily tortuous left lower extremity limited angiography developed demonstrated a SFA to be helped heavily calcified throughout with multiple greater than 50% stenosis but never frankly occludes he was not felt to be a candidate for endovascular repair. I think they had a minimal look at the right side and it appeared that the SFA was occluded and reconstitutes above the knee. The overall feeling was that if he could not heal his left heel ulcer he will eventually best be served by a left above-knee amputation The last I can see is that he should have been using a Santyl wet-to-dry dressing to the heel. During his stay with podiatry was also noted that he had venous insufficiency and I think venous insufficiency wounds. His wife tells me that he has been recommended for compression stockings in the past but the patient refused, he has wounds on his bilateral anterior lower legs right greater than the left. Chronic venous inflammation and dry flaking skin. Past medical history; heart failure, right heel wound, paroxysmal atrial fibrillation on Eliquis, abdominal aortic aneurysm with previous repair, COPD, DVT, melanoma on his back, diabetes with a recent hemoglobin A1c of 7.3 however he is not on any  treatment [type II], PAD, coronary artery disease, hypertension The patient's most recent arterial studies were on 05/28/2020. This showed an ABI on the right at 1.12 but monophasic waveforms and absent waveforms at the posterior tibial artery. On the left his ABI was 1.47 [noncompressible] he had triphasic waveforms at the ATA and monophasic at the PTA. His TBI on the right was 1.12 on the left and 0.97. It was felt his ABIs were falsely elevated 9/24. The patient followed up with Dr. Donzetta Matters and had noninvasive arterial studies. On the right his ABIs were noncompressible monophasic waveforms with a TBI of 1.45 and a right great toe pressure of 172. On the left he had monophasic waveforms with noncompressible ABIs TBI of 0.79 and a left great toe pressure of 94. He will be seen in follow-up by Dr. Donzetta Matters in a month's time. The patient also has very significant chronic venous insufficiency 10/8; patient with known PAD. He came in with bilateral lower extremity wounds likely secondary to venous stasis. Still has one on the upper right tibial area. The area on the left leg is healed however he still has an area on the left heel. This needed an extensive debridement today. His wife brings in notes apparently Dr. March Rummage ordered an MRI of the left heel. I was unaware of this. I was not aware that he did not been seen by Dr. March Rummage and I do not have any records of this. Apparently MRI called to set up the appointment. I will need to look through epic on this 10/15; this is a patient with known PAD who was revascularized. He also has venous stasis disease. The area that was on the right anterior tibia has actually close down although he has a still a small area on the upper anterior tibia. 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 We have been  using Iodoflex on this area and he is required debridements. We tried to get her to agree to an MRI that have been ordered  by Dr. March Rummage. However she did not get back to the MRI people or they have not called her back. We will reorder this. He is quite tender around this wound and I wonder about osteomyelitis. 10/29; MRI. Fortunately this did not show any evidence of abscess or osteomyelitis in the left heel. Vein and vascular on 10/21; this is a patient who is previously had an angiogram. He had a heavily calcified aorta and iliac vessels as well as the SFA. He had multiple areas of diffuse 50% stenosis. No interventions were apparently done by Dr. Donzetta Matters he does have an occluded right SFA but with above-knee reconstitution. He was not felt to be a candidate for revascularization for his diffuse areas of greater than 50% stenosis. It was not felt that these were amenable to intervention Since he was last here he went to see dermatology. I believe he had skin cancer extractions from the right area anteriorly and 2 on the left medial. 11/12; left heel actually looks somewhat better but still requiring debridement. We've been using Iodoflex Right anterior mid tibia surgical extraction for a skin cancer. This also required debridement The area on the left lower medial leg looks a lot better and is just about fully epithelialized 12/3; pressure ulcer on the left heel in the setting of PAD. This looks better still requiring debridement we have been using Iodoflex Right anterior mid tibia surgical extraction for cancer this also continually requires debridement although the wound measures smaller The area on the left lower medial leg had much more eschar on this than last time this is a deterioration. Notable for the fact that home health only wrap the leg halfway and actually had one of the wraps at the level of the wound on the right anterior. 12/17; the area on the left heel is just about closed. The area  on the right anterior tibia looks much healthier and is smaller. The area on the left medial ankle I think is also smaller. We have been using Iodoflex to all areas under 3 layer compression bilaterally. He has home health 1/7; left heel is healed. He has an area on the right anterior tibia and the left medial lower leg. Significant chronic venous insufficiency he has home health Electronic Signature(s) Signed: 01/01/2021 4:48:18 PM By: Linton Ham MD Entered By: Linton Ham on 01/01/2021 14:02:31 -------------------------------------------------------------------------------- Physical Exam Details Patient Name: Date of Service: Andrew [name], Andrew RGE A. 01/01/2021 1:15 PM Medical Record Number: [id number] Patient Account Number: [id number] Date of Birth/Sex: Treating RN: [birth year] (87 y.o. [name] Primary Care Provider: Dimas Chyle Other Clinician: Referring Provider: Treating Provider/Extender: Donalee Citrin in Treatment: 16 Constitutional Sitting or standing Blood Pressure is within target range for patient.. Pulse regular and within target range for patient.Marland Kitchen Respirations regular, non-labored and within target range.. Temperature is normal and within the target range for the patient.Marland Kitchen Appears in no distress. Cardiovascular Pedal pulses are palpable. Edema is well controlled. Notes Wound exam; using a #3 curette I removed callus and skin from the circumference of the wound and fibrinous debris from the surface on both sides. These clean up quite nicely Electronic Signature(s) Signed: 01/01/2021 4:48:18 PM By: Linton Ham MD Entered By: Linton Ham on 01/01/2021 14:03:28 -------------------------------------------------------------------------------- Physician Orders Details Patient Name: Date of Service: Andrew [name], Andrew RGE A. 01/01/2021 1:15 PM Medical Record Number: [id number] Patient Account Number: [id number] Date of Birth/Sex: Treating  RN: 12-11-1928 (87 y.o. [name] Primary Care Provider: Dimas Chyle  Other Clinician: Referring Provider: Treating Provider/Extender: Donalee Citrin in Treatment: 16 Verbal / Phone Orders: No Diagnosis Coding ICD-10 Coding Code Description E11.621 Type 2 diabetes mellitus with foot ulcer L97.422 Non-pressure chronic ulcer of left heel and midfoot with fat layer exposed I87.333 Chronic venous hypertension (idiopathic) with ulcer and inflammation of bilateral lower extremity L97.811 Non-pressure chronic ulcer of other part of right lower leg limited to breakdown of skin L97.818 Non-pressure chronic ulcer of other part of right lower leg with other specified severity L97.829 Non-pressure chronic ulcer of other part of left lower leg with unspecified severity T81.31XA Disruption of external operation (surgical) wound, not elsewhere classified, initial encounter Follow-up Appointments Return Appointment in 2 weeks. Bathing/ Shower/ Hygiene May shower with protection but do not get wound dressing(s) wet. Edema Control - Lymphedema / SCD / Other Bilateral Lower Extremities Elevate legs to the level of the heart or above for 30 minutes daily and/or when sitting, a frequency of: - throughout the day Avoid standing for long periods of time. Exercise regularly Home Health No change in wound care orders this week; continue Home Health for wound care. May utilize formulary equivalent dressing for wound treatment orders unless otherwise specified. Other Home Health Orders/Instructions: - Medi Home Wound Treatment Wound #4 - Lower Leg Wound Laterality: Right, Anterior, Proximal Cleanser: Normal Saline ([place]) 2 x Per Week/30 Days Discharge Instructions: Cleanse the wound with Normal Saline prior or wound cleanser to applying a clean dressing using gauze sponges, not tissue or cotton balls. Cleanser: Soap and Water [facility]) 2 x Per Week/30  Days Discharge Instructions: May shower and wash wound with dial antibacterial soap and water prior to dressing change. Peri-Wound Care: Sween Lotion (Moisturizing lotion) (Home Health) 2 x Per Week/30 Days Discharge Instructions: Apply moisturizing lotion or equivalent moisterizing lotion to legs under wraps Prim Dressing: IODOFLEX 0.9% Cadexomer Iodine Pad 4x6 cm (Home Health) 2 x Per Week/30 Days ary Discharge Instructions: Apply Iodoflex or Iodosorb to wound bed as instructed Secondary Dressing: Woven Gauze Sponge, Non-Sterile 4x4 in (Home Health) 2 x Per Week/30 Days Discharge Instructions: Apply over primary dressing as directed. Compression Wrap: Kerlix Roll 4.5x3.1 (in/yd) (Home Health) 2 x Per Week/30 Days Discharge Instructions: Apply Kerlix and Coban compression , wrap from base of toes to knee Compression Wrap: Coban Self-Adherent Wrap 4x5 (in/yd) (Home Health) 2 x Per Week/30 Days Discharge Instructions: Apply over Kerlix as directed. Wound #5 - Lower Leg Wound Laterality: Left, Medial Cleanser: Normal Saline (Home Health) 2 x Per Week/30 Days Discharge Instructions: Cleanse the wound with Normal Saline or wound cleanser prior to applying a clean dressing using gauze sponges, not tissue or cotton balls. Cleanser: Soap and Water [facility]) 2 x Per Week/30 Days Discharge Instructions: May shower and wash wound with dial antibacterial soap and water prior to dressing change. Peri-Wound Care: Sween Lotion (Moisturizing lotion) (Home Health) 2 x Per Week/30 Days Discharge Instructions: Apply moisturizing lotion or equivalent moisterizing lotion to legs under wraps Prim Dressing: IODOFLEX 0.9% Cadexomer Iodine Pad 4x6 cm (Home Health) 2 x Per Week/30 Days ary Discharge Instructions: Apply Iodoflex or Iodosorb to wound bed as instructed Secondary Dressing: Woven Gauze Sponge, Non-Sterile 4x4 in (Home Health) 2 x Per Week/30 Days Discharge Instructions: Apply over primary dressing as  directed. Compression Wrap: Kerlix Roll 4.5x3.1 (in/yd) (Home Health) 2 x Per Week/30 Days Discharge Instructions: Apply Kerlix and Coban compression , wrap from base of toes to knee Compression Wrap: Coban Self-Adherent  Wrap 4x5 (in/yd) (Home Health) 2 x Per Week/30 Days Discharge Instructions: Apply over Kerlix as directed. Electronic Signature(s) Signed: 01/01/2021 4:46:42 PM By: Baruch Gouty RN, BSN Signed: 01/01/2021 4:48:18 PM By: Linton Ham MD Entered By: Baruch Gouty on 01/01/2021 13:55:52 -------------------------------------------------------------------------------- Problem List Details Patient Name: Date of Service: Andrew [name], Andrew RGE A. 01/01/2021 1:15 PM Medical Record Number: [id number] Patient Account Number: [id number] Date of Birth/Sex: Treating RN: 05-08-1928 (87 y.o. [name] Primary Care Provider: Dimas Chyle Other Clinician: Referring Provider: Treating Provider/Extender: Donalee Citrin in Treatment: 16 Active Problems ICD-10 Encounter Code Description Active Date MDM Diagnosis E11.621 Type 2 diabetes mellitus with foot ulcer 09/11/2020 No Yes L97.422 Non-pressure chronic ulcer of left heel and midfoot with fat layer exposed 09/11/2020 No Yes I87.333 Chronic venous hypertension (idiopathic) with ulcer and inflammation of 09/11/2020 No Yes bilateral lower extremity L97.811 Non-pressure chronic ulcer of other part of right lower leg limited to breakdown 09/11/2020 No Yes of skin L97.818 Non-pressure chronic ulcer of other part of right lower leg with other specified 10/23/2020 No Yes severity L97.829 Non-pressure chronic ulcer of other part of left lower leg with unspecified 10/23/2020 No Yes severity T81.31XA Disruption of external operation (surgical) wound, not elsewhere classified, 10/23/2020 No Yes initial encounter Inactive Problems ICD-10 Code Description Active Date Inactive Date L97.821 Non-pressure chronic ulcer  of other part of left lower leg limited to breakdown of skin 09/11/2020 09/11/2020 Resolved Problems Electronic Signature(s) Signed: 01/01/2021 4:48:18 PM By: Linton Ham MD Entered By: Linton Ham on 01/01/2021 14:01:27 -------------------------------------------------------------------------------- Progress Note Details Patient Name: Date of Service: Andrew [name], Andrew RGE A. 01/01/2021 1:15 PM Medical Record Number: [id number] Patient Account Number: [id number] Date of Birth/Sex: Treating RN: Nov 04, 1928 (87 y.o. [name] Primary Care Provider: Dimas Chyle Other Clinician: Referring Provider: Treating Provider/Extender: Donalee Citrin in Treatment: 16 Subjective History of Present Illness (HPI) 09/11/2020 ADMISSION This is a soon-to-be 87 year old man who was hospitalized in June and subsequently spent time at [place] home. At some point he developed a pressure ulcer on his left heel. On discharge he was seen by podiatry and referred to Dr. Donzetta Matters with regards to the possibility of PAD. The patient underwent angiography on 08/10/2020 this showed heavily calcified vessels. The common iliac artery on the left appeared to have calcific disease but there was no gradient. Left common external leg arteries were heavily tortuous left lower extremity limited angiography developed demonstrated a SFA to be helped heavily calcified throughout with multiple greater than 50% stenosis but never frankly occludes he was not felt to be a candidate for endovascular repair. I think they had a minimal look at the right side and it appeared that the SFA was occluded and reconstitutes above the knee. The overall feeling was that if he could not heal his left heel ulcer he will eventually best be served by a left above-knee amputation The last I can see is that he should have been using a Santyl wet-to-dry dressing to the heel. During his stay with podiatry was also noted  that he had venous insufficiency and I think venous insufficiency wounds. His wife tells me that he has been recommended for compression stockings in the past but the patient refused, he has wounds on his bilateral anterior lower legs right greater than the left. Chronic venous inflammation and dry flaking skin. Past medical history; heart failure, right heel wound, paroxysmal atrial fibrillation on Eliquis, abdominal aortic aneurysm with previous repair, COPD,  DVT, melanoma on his back, diabetes with a recent hemoglobin A1c of 7.3 however he is not on any treatment [type II], PAD, coronary artery disease, hypertension The patient's most recent arterial studies were on 05/28/2020. This showed an ABI on the right at 1.12 but monophasic waveforms and absent waveforms at the posterior tibial artery. On the left his ABI was 1.47 [noncompressible] he had triphasic waveforms at the ATA and monophasic at the PTA. His TBI on the right was 1.12 on the left and 0.97. It was felt his ABIs were falsely elevated 9/24. The patient followed up with Dr. Donzetta Matters and had noninvasive arterial studies. On the right his ABIs were noncompressible monophasic waveforms with a TBI of 1.45 and a right great toe pressure of 172. On the left he had monophasic waveforms with noncompressible ABIs TBI of 0.79 and a left great toe pressure of 94. He will be seen in follow-up by Dr. Donzetta Matters in a month's time. The patient also has very significant chronic venous insufficiency 10/8; patient with known PAD. He came in with bilateral lower extremity wounds likely secondary to venous stasis. Still has one on the upper right tibial area. The area on the left leg is healed however he still has an area on the left heel. This needed an extensive debridement today. His wife brings in notes apparently Dr. March Rummage ordered an MRI of the left heel. I was unaware of this. I was not aware that he did not been seen by Dr. March Rummage and I do not have any records of  this. Apparently MRI called to set up the appointment. I will need to look through epic on this 10/15; this is a patient with known PAD who was revascularized. He also has venous stasis disease. The area that was on the right anterior tibia has actually close down although he has a still a small area on the upper anterior tibia. 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 We have been using Iodoflex on this area and he is required debridements. We tried to get her to agree to an MRI that have been ordered by Dr. March Rummage. However she did not get back to the MRI people or they have not called her back. We will reorder this. He is quite tender around this wound and I wonder about osteomyelitis. 10/29; MRI. Fortunately this did not show any evidence of abscess or osteomyelitis in the left heel. Vein and vascular on 10/21; this is a patient who is previously had an angiogram. He had a heavily calcified aorta and iliac vessels as well as the SFA. He had multiple areas of diffuse 50% stenosis. No interventions were apparently done by Dr. Donzetta Matters he does have an occluded right SFA but with above-knee reconstitution. He was not felt to be a candidate for revascularization for his diffuse areas of greater than 50% stenosis. It was not felt that these were amenable to intervention Since he was last here he went to see dermatology. I believe he had skin cancer extractions from the right area anteriorly and 2 on the left medial. 11/12; left heel actually looks somewhat better but still requiring debridement. We've been using Iodoflex ooRight anterior mid tibia surgical extraction for a skin cancer. This also required debridement ooThe area on the left lower medial leg looks a  lot better and is just about fully epithelialized 12/3; pressure  ulcer on the left heel in the setting of PAD. This looks better still requiring debridement we have been using Iodoflex ooRight anterior mid tibia surgical extraction for cancer this also continually requires debridement although the wound measures smaller ooThe area on the left lower medial leg had much more eschar on this than last time this is a deterioration. ooNotable for the fact that home health only wrap the leg halfway and actually had one of the wraps at the level of the wound on the right anterior. 12/17; the area on the left heel is just about closed. The area on the right anterior tibia looks much healthier and is smaller. The area on the left medial ankle I think is also smaller. We have been using Iodoflex to all areas under 3 layer compression bilaterally. He has home health 1/7; left heel is healed. He has an area on the right anterior tibia and the left medial lower leg. Significant chronic venous insufficiency he has home health Objective Constitutional Sitting or standing Blood Pressure is within target range for patient.. Pulse regular and within target range for patient.Marland Kitchen Respirations regular, non-labored and within target range.. Temperature is normal and within the target range for the patient.Marland Kitchen Appears in no distress. Vitals Time Taken: 1:21 PM, Height: 67 in, Weight: 185 lbs, BMI: 29, Temperature: 98.4 F, Pulse: 71 bpm, Respiratory Rate: 16 breaths/min, Blood Pressure: 126/66 mmHg. Cardiovascular Pedal pulses are palpable. Edema is well controlled. General Notes: Wound exam; using a #3 curette I removed callus and skin from the circumference of the wound and fibrinous debris from the surface on both sides. These clean up quite nicely Integumentary (Hair, Skin) Wound #2 status is Healed - Epithelialized. Original cause of wound was Gradually Appeared. The wound is located on the Left Calcaneus. The wound measures 0cm length x 0cm width x 0cm depth; 0cm^2 area and  0cm^3 volume. Wound #4 status is Open. Original cause of wound was Gradually Appeared. The wound is located on the Right,Proximal,Anterior Lower Leg. The wound measures 1cm length x 0.7cm width x 0.1cm depth; 0.55cm^2 area and 0.055cm^3 volume. There is Fat Layer (Subcutaneous Tissue) exposed. There is no tunneling or undermining noted. There is a medium amount of serosanguineous drainage noted. The wound margin is flat and intact. There is large (67-100%) red granulation within the wound bed. There is a small (1-33%) amount of necrotic tissue within the wound bed including Adherent Slough. Wound #5 status is Open. Original cause of wound was Gradually Appeared. The wound is located on the Left,Medial Lower Leg. The wound measures 1cm length x 1.3cm width x 0.1cm depth; 1.021cm^2 area and 0.102cm^3 volume. There is Fat Layer (Subcutaneous Tissue) exposed. There is no tunneling noted. There is a medium amount of serosanguineous drainage noted. The wound margin is distinct with the outline attached to the wound base. There is large (67- 100%) red granulation within the wound bed. There is no necrotic tissue within the wound bed. 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 There was a Excisional Skin/Subcutaneous Tissue Debridement with a total area of 0.7 sq cm performed by Ricard Dillon., MD. With the following instrument(s): Curette to remove Viable and Non-Viable tissue/material. Material removed includes Subcutaneous Tissue and Slough and after achieving pain control using Other (benzocaine 20% spray). No specimens were taken. A time out was conducted at 13:45, prior to the start of the procedure. A Minimum amount of bleeding was controlled with Pressure. The procedure was tolerated well with a pain level of 3 throughout and a pain level of 1 following the procedure. Post Debridement Measurements: 1cm length x 0.7cm width x 0.1cm depth; 0.055cm^3 volume. Character of Wound/Ulcer Post Debridement is improved. Severity of Tissue Post Debridement is: Fat layer exposed. Post procedure Diagnosis Wound #4: Same as Pre-Procedure Wound #5 Pre-procedure diagnosis of Wound #5 is a Diabetic Wound/Ulcer of the Lower Extremity located on the Left,Medial Lower Leg .Severity of Tissue Pre Debridement is: Fat layer exposed. There was a Excisional Skin/Subcutaneous Tissue Debridement with a total area of 1.3 sq cm performed by Ricard Dillon., MD. With the following instrument(s): Curette to remove Viable and Non-Viable tissue/material. Material removed includes Subcutaneous Tissue and Slough and after achieving pain control using Other (benzocaine 20% spray). No specimens were taken. A time out was conducted at 13:45, prior to the start of the procedure. A Minimum amount of bleeding was controlled with Pressure. The procedure was tolerated well with a pain level of 3 throughout and a pain level of 1 following the procedure. Post Debridement Measurements: 1cm length x 1.3cm width x 0.1cm depth; 0.102cm^3 volume. Character of Wound/Ulcer Post Debridement is improved. Severity of Tissue Post Debridement is: Fat layer exposed. Post procedure Diagnosis Wound  #5: Same as Pre-Procedure Plan Follow-up Appointments: Return Appointment in 2 weeks. Bathing/ Shower/ Hygiene: May shower with protection but do not get wound dressing(s) wet. Edema Control - Lymphedema / SCD / Other: Elevate legs to the level of the heart or above for 30 minutes daily and/or when sitting, a frequency of: - throughout the day Avoid standing for long periods of time. Exercise regularly Home Health: No change in wound care orders this week; continue Home Health for wound care. May utilize formulary equivalent dressing for wound treatment orders unless otherwise specified. Other Home Health Orders/Instructions: - Medi Home WOUND #4: - Lower Leg Wound Laterality: Right, Anterior, Proximal Cleanser: Normal Saline ([place]) 2 x Per Week/30 Days Discharge Instructions: Cleanse the wound with Normal Saline prior or wound cleanser to applying a clean dressing using gauze sponges, not tissue or cotton balls. Cleanser: Soap and Water [facility]) 2 x Per Week/30 Days Discharge Instructions: May shower and wash wound with dial antibacterial soap and water prior to dressing change. Peri-Wound Care: Sween Lotion (Moisturizing lotion) (Home Health) 2 x Per Week/30 Days Discharge Instructions: Apply moisturizing lotion or equivalent moisterizing lotion to legs under wraps Prim Dressing: IODOFLEX 0.9% Cadexomer Iodine Pad 4x6 cm (Home Health) 2 x Per Week/30 Days ary Discharge Instructions: Apply Iodoflex or Iodosorb to wound bed as instructed Secondary Dressing: Woven Gauze Sponge, Non-Sterile 4x4 in (Home Health) 2 x Per Week/30 Days Discharge Instructions: Apply over primary dressing as directed. Com pression Wrap: Kerlix Roll 4.5x3.1 (in/yd) (Home Health) 2 x Per Week/30 Days Discharge Instructions: Apply  Kerlix and Coban compression , wrap from base of toes to knee Com pression Wrap: Coban Self-Adherent Wrap 4x5 (in/yd) (Home Health) 2 x Per Week/30 Days Discharge  Instructions: Apply over Kerlix as directed. WOUND #5: - Lower Leg Wound Laterality: Left, Medial Cleanser: Normal Saline (Home Health) 2 x Per Week/30 Days Discharge Instructions: Cleanse the wound with Normal Saline or wound cleanser prior to applying a clean dressing using gauze sponges, not tissue or cotton balls. Cleanser: Soap and Water [facility]) 2 x Per Week/30 Days Discharge Instructions: May shower and wash wound with dial antibacterial soap and water prior to dressing change. Peri-Wound Care: Sween Lotion (Moisturizing lotion) (Home Health) 2 x Per Week/30 Days Discharge Instructions: Apply moisturizing lotion or equivalent moisterizing lotion to legs under wraps Prim Dressing: IODOFLEX 0.9% Cadexomer Iodine Pad 4x6 cm (Home Health) 2 x Per Week/30 Days ary Discharge Instructions: Apply Iodoflex or Iodosorb to wound bed as instructed Secondary Dressing: Woven Gauze Sponge, Non-Sterile 4x4 in (Home Health) 2 x Per Week/30 Days Discharge Instructions: Apply over primary dressing as directed. Com pression Wrap: Kerlix Roll 4.5x3.1 (in/yd) (Home Health) 2 x Per Week/30 Days Discharge Instructions: Apply Kerlix and Coban compression , wrap from base of toes to knee Com pression Wrap: Coban Self-Adherent Wrap 4x5 (in/yd) (Home Health) 2 x Per Week/30 Days Discharge Instructions: Apply over Kerlix as directed. 1. Continue with Iodoflex to both wound areas using kerlix and Coban compression 2. May be healed by the next time we see him Electronic Signature(s) Signed: 01/01/2021 4:48:18 PM By: Linton Ham MD Entered By: Linton Ham on 01/01/2021 14:04:15 -------------------------------------------------------------------------------- SuperBill Details Patient Name: Date of Service: Andrew [name], Andrew RGE A. 01/01/2021 Medical Record Number: [id number] Patient Account Number: [id number] Date of Birth/Sex: Treating RN: 1928-02-15 (87 y.o. [name] Primary Care Provider:  Dimas Chyle Other Clinician: Referring Provider: Treating Provider/Extender: Donalee Citrin in Treatment: 16 Diagnosis Coding ICD-10 Codes Code Description E11.621 Type 2 diabetes mellitus with foot ulcer L97.422 Non-pressure chronic ulcer of left heel and midfoot with fat layer exposed I87.333 Chronic venous hypertension (idiopathic) with ulcer and inflammation of bilateral lower extremity L97.811 Non-pressure chronic ulcer of other part of right lower leg limited to breakdown of skin L97.818 Non-pressure chronic ulcer of other part of right lower leg with other specified severity L97.829 Non-pressure chronic ulcer of other part of left lower leg with unspecified severity T81.31XA Disruption of external operation (surgical) wound, not elsewhere classified, initial encounter Facility Procedures CPT4 Code: [id number] Description: 11042 - DEB SUBQ TISSUE 20 SQ CM/< ICD-10 Diagnosis Description L97.818 Non-pressure chronic ulcer of other part of right lower leg with other specified L97.829 Non-pressure chronic ulcer of other part of left lower leg with unspecified seve Modifier: severity rity Quantity: 1 Physician Procedures : CPT4 Code Description Modifier [id number] 11042 - WC PHYS SUBQ TISS 20 SQ CM ICD-10 Diagnosis Description L97.818 Non-pressure chronic ulcer of other part of right lower leg with other specified severity L97.829 Non-pressure chronic ulcer of other part of  left lower leg with unspecified severity Quantity: 1 Electronic Signature(s) Signed: 01/01/2021 4:48:18 PM By: Linton Ham MD Entered By: Linton Ham on 01/01/2021 14:04:32

## 2021-01-01 NOTE — Progress Notes (Signed)
Andrew [name], Andrew [name] ([id number]) Visit Report for 01/01/2021 Arrival Information Details Patient Name: Date of Service: Andrew [name], Andrew [facility] [name]. 01/01/2021 1:15 PM Medical Record Number: [id number] Patient Account Number: [id number] Date of Birth/Sex: Treating RN: 1928/11/13 (87 y.o. Lorette Ang, Meta.Reding Primary Care Tobin Witucki: Dimas Chyle Other Clinician: Referring Enrica Corliss: Treating Jacquese Hackman/Extender: Donalee Citrin in Treatment: 16 Visit Information History Since Last Visit Added or deleted any medications: No Patient Arrived: Wheel Chair Any new allergies or adverse reactions: No Arrival Time: 13:20 Had [name] fall or experienced change in No Accompanied By: self activities of daily living that may affect Transfer Assistance: None risk of falls: Patient Identification Verified: Yes Signs or symptoms of abuse/neglect since last visito No Secondary Verification Process Completed: Yes Hospitalized since last visit: No Patient Requires Transmission-Based Precautions: No Implantable device outside of the clinic excluding No Patient Has Alerts: No cellular tissue based products placed in the center since last visit: Has Dressing in Place as Prescribed: No Pain Present Now: No Electronic Signature(s) Signed: 01/01/2021 4:33:40 PM By: Deon Pilling Entered By: Deon Pilling on 01/01/2021 13:29:38 -------------------------------------------------------------------------------- Encounter Discharge Information Details Patient Name: Date of Service: Andrew [name], Andrew [name]. 01/01/2021 1:15 PM Medical Record Number: [id number] Patient Account Number: [id number] Date of Birth/Sex: Treating RN: 1928-01-13 (87 y.o. [name] Primary Care Airen Stiehl: Dimas Chyle Other Clinician: Referring Nneka Blanda: Treating Natalin Bible/Extender: Donalee Citrin in Treatment: 16 Encounter Discharge Information Items Post Procedure Vitals Discharge Condition: Stable Temperature (F):  98.4 Ambulatory Status: Wheelchair Pulse (bpm): 71 Discharge Destination: Home Respiratory Rate (breaths/min): 16 Transportation: Private Auto Blood Pressure (mmHg): 126/66 Accompanied By: self Schedule Follow-up Appointment: Yes Clinical Summary of Care: Electronic Signature(s) Signed: 01/01/2021 4:33:40 PM By: Deon Pilling Entered By: Deon Pilling on 01/01/2021 14:22:42 -------------------------------------------------------------------------------- Lower Extremity Assessment Details Patient Name: Date of Service: Andrew [name], Andrew [name]. 01/01/2021 1:15 PM Medical Record Number: [id number] Patient Account Number: [id number] Date of Birth/Sex: Treating RN: 10-10-1928 (87 y.o. [name] Primary Care Jasman Pfeifle: Dimas Chyle Other Clinician: Referring Tyqwan Pink: Treating Krystel Fletchall/Extender: Donalee Citrin in Treatment: 16 Edema Assessment Assessed: Shirlyn Goltz: Yes] Patrice Paradise: Yes] Edema: [Left: No] [Right: No] Calf Left: Right: Point of Measurement: 44 cm From Medial Instep 31 cm 32 cm Ankle Left: Right: Point of Measurement: 10 cm From Medial Instep 21 cm 22 cm Vascular Assessment Pulses: Dorsalis Pedis Palpable: [Left:Yes] [Right:Yes] Electronic Signature(s) Signed: 01/01/2021 4:33:40 PM By: Deon Pilling Entered By: Deon Pilling on 01/01/2021 13:30:39 -------------------------------------------------------------------------------- Multi Wound Chart Details Patient Name: Date of Service: Andrew [name], Andrew [name]. 01/01/2021 1:15 PM Medical Record Number: [id number] Patient Account Number: [id number] Date of Birth/Sex: Treating RN: 1928-03-19 (87 y.o. [name] Primary Care Nigil Braman: Dimas Chyle Other Clinician: Referring Cadynce Garrette: Treating Hadlei Stitt/Extender: Donalee Citrin in Treatment: 16 Vital Signs Height(in): 65 Pulse(bpm): 71 Weight(lbs): 185 Blood Pressure(mmHg): 126/66 Body Mass Index(BMI): 29 Temperature(F):  98.4 Respiratory Rate(breaths/min): 16 Photos: [2:No Photos Left Calcaneus] [4:No Photos Right, Proximal, Anterior Lower Leg] [5:No Photos Left, Medial Lower Leg] Wound Location: [2:Gradually Appeared] [4:Gradually Appeared] [5:Gradually Appeared] Wounding Event: [2:Diabetic Wound/Ulcer of the Lower] [4:Venous Leg Ulcer] [5:Diabetic Wound/Ulcer of the Lower] Primary Etiology: [2:Extremity N/[name]] [4:Congestive Heart Failure, Coronary Congestive Heart Failure, Coronary] [5:Extremity] Comorbid History: [2:05/26/2020] [4:Artery Disease, Hypertension, Peripheral Arterial Disease, Peripheral Peripheral Arterial Disease, Peripheral Venous Disease, Type II Diabetes 10/09/2020] [5:Artery Disease, Hypertension, Venous Disease, Type II Diabetes  10/23/2020] Date Acquired: [2:16] [4:12] [5:10] Weeks of Treatment: [2:Healed -  Epithelialized] [4:Open] [5:Open] Wound Status: [2:No] [4:No] [5:Yes] Clustered Wound: [2:N/[name]] [4:N/[name]] [5:2] Clustered Quantity: [2:0x0x0] [4:1x0.7x0.1] [5:1x1.3x0.1] Measurements L x W x D (cm) [2:0] [4:0.55] [5:1.021] [name] (cm) : rea [2:0] [4:0.055] [5:0.102] Volume (cm) : [2:100.00%] [4:-485.10%] [5:-170.80%] % Reduction in Area: [2:100.00%] [4:-511.10%] [5:-168.40%] % Reduction in Volume: [2:Grade 2] [4:Full Thickness Without Exposed] [5:Grade 2] Classification: [2:N/[name]] [4:Support Structures Medium] [5:Medium] Exudate [name] mount: [2:N/[name]] [4:Serosanguineous] [5:Serosanguineous] Exudate Type: [2:N/[name]] [4:red, brown] [5:red, brown] Exudate Color: [2:N/[name]] [4:Flat and Intact] [5:Distinct, outline attached] Wound Margin: [2:N/[name]] [4:Large (67-100%)] [5:Large (67-100%)] Granulation [name] mount: [2:N/[name]] [4:Red] [5:Red] Granulation Quality: [2:N/[name]] [4:Small (1-33%)] [5:None Present (0%)] Necrotic [name] mount: [2:N/[name]] [4:Large (67-100%)] [5:Large (67-100%)] Epithelialization: [2:N/[name]] [4:Debridement - Excisional] [5:Debridement - Excisional] Debridement: [2:N/[name]] [4:13:45] [5:13:45] Pre-procedure  Verification/Time Out Taken: [2:N/[name]] [4:Other] [5:Other] Pain Control: [2:N/[name]] [4:Subcutaneous, Slough] [5:Subcutaneous, Slough] Tissue Debrided: [2:N/[name]] [4:Skin/Subcutaneous Tissue] [5:Skin/Subcutaneous Tissue] Level: [2:N/[name]] [4:0.7] [5:1.3] Debridement [name] (sq cm): [2:rea N/[name]] [4:Curette] [5:Curette] Instrument: [2:N/[name]] [4:Minimum] [5:Minimum] Bleeding: [2:N/[name]] [4:Pressure] [5:Pressure] Hemostasis [name] chieved: [2:N/[name]] [4:3] [5:3] Procedural Pain: [2:N/[name]] [4:1] [5:1] Post Procedural Pain: [2:N/[name]] [4:Procedure was tolerated well] [5:Procedure was tolerated well] Debridement Treatment Response: [2:N/[name]] [4:1x0.7x0.1] [5:1x1.3x0.1] Post Debridement Measurements L x W x D (cm) [2:N/[name]] [4:0.055] [5:0.102] Post Debridement Volume: (cm) [2:N/[name]] [4:Debridement] [5:Debridement] Treatment Notes Electronic Signature(s) Signed: 01/01/2021 4:46:42 PM By: Baruch Gouty RN, BSN Signed: 01/01/2021 4:48:18 PM By: Linton Ham MD Entered By: Linton Ham on 01/01/2021 14:01:35 -------------------------------------------------------------------------------- Multi-Disciplinary Care Plan Details Patient Name: Date of Service: Andrew [name], Andrew [name]. 01/01/2021 1:15 PM Medical Record Number: [id number] Patient Account Number: [id number] Date of Birth/Sex: Treating RN: 12-13-1928 (87 y.o. [name] Primary Care Branndon Tuite: Dimas Chyle Other Clinician: Referring Donney Caraveo: Treating Esmond Hinch/Extender: Donalee Citrin in Treatment: 16 Active Inactive Wound/Skin Impairment Nursing Diagnoses: Impaired tissue integrity Goals: Patient/caregiver will verbalize understanding of skin care regimen Date Initiated: 10/23/2020 Target Resolution Date: 01/29/2021 Goal Status: Active Ulcer/skin breakdown will have [name] volume reduction of 50% by week 8 Date Initiated: 09/11/2020 Date Inactivated: 10/23/2020 Target Resolution Date: 10/16/2020 Goal Status: Unmet Unmet Reason:  PAD Interventions: Provide education on ulcer and skin care Notes: Electronic Signature(s) Signed: 01/01/2021 4:46:42 PM By: Baruch Gouty RN, BSN Entered By: Baruch Gouty on 01/01/2021 13:48:28 -------------------------------------------------------------------------------- Pain Assessment Details Patient Name: Date of Service: Andrew [name], Andrew [name]. 01/01/2021 1:15 PM Medical Record Number: [id number] Patient Account Number: [id number] Date of Birth/Sex: Treating RN: [birth year] (87 y.o. [name] Primary Care Reily Treloar: Dimas Chyle Other Clinician: Referring Avian Greenawalt: Treating Latasia Silberstein/Extender: Donalee Citrin in Treatment: 16 Active Problems Location of Pain Severity and Description of Pain Patient Has Paino No Site Locations Rate the pain. Current Pain Level: 0 Pain Management and Medication Current Pain Management: Medication: No Cold Application: No Rest: No Massage: No Activity: No T.E.N.S.: No Heat Application: No Leg drop or elevation: No Is the Current Pain Management Adequate: Adequate How does your wound impact your activities of daily livingo Sleep: No Bathing: No Appetite: No Relationship With Others: No Bladder Continence: No Emotions: No Bowel Continence: No Work: No Toileting: No Drive: No Dressing: No Hobbies: No Electronic Signature(s) Signed: 01/01/2021 4:33:40 PM By: Deon Pilling Entered By: Deon Pilling on 01/01/2021 13:30:23 -------------------------------------------------------------------------------- Patient/Caregiver Education Details Patient Name: Date of Service: Andrew [name], Andrew [name]. 1/7/2022andnbsp1:15 PM Medical Record Number: [id number] Patient Account Number: [id number] Date of Birth/Gender: Treating RN: Dec 16, 1928 (87 y.o. [name] Primary Care Physician: Dimas Chyle Other Clinician: Referring Physician: Treating Physician/Extender:  Donalee Citrin in  Treatment: 16 Education Assessment Education Provided To: Patient Education Topics Provided Wound/Skin Impairment: Methods: Explain/Verbal Responses: Reinforcements needed, State content correctly [employer]) Signed: 01/01/2021 4:46:42 PM By: Baruch Gouty RN, BSN Entered By: Baruch Gouty on 01/01/2021 13:49:14 -------------------------------------------------------------------------------- Wound Assessment Details Patient Name: Date of Service: Andrew [name], Andrew [name]. 01/01/2021 1:15 PM Medical Record Number: [id number] Patient Account Number: [id number] Date of Birth/Sex: Treating RN: 1928/11/23 (87 y.o. [name] Primary Care Delmy Holdren: Dimas Chyle Other Clinician: Referring Apolinar Bero: Treating Lorretta Kerce/Extender: Donalee Citrin in Treatment: 16 Wound Status Wound Number: 2 Primary Etiology: Diabetic Wound/Ulcer of the Lower Extremity Wound Location: Left Calcaneus Wound Status: Healed - Epithelialized Wounding Event: Gradually Appeared Date Acquired: 05/26/2020 Weeks Of Treatment: 16 Clustered Wound: No Wound Measurements Length: (cm) Width: (cm) Depth: (cm) Area: (cm) Volume: (cm) 0 % Reduction in Area: 100% 0 % Reduction in Volume: 100% 0 0 0 Wound Description Classification: Grade 2 Treatment Notes Wound #2 (Calcaneus) Wound Laterality: Left Cleanser Peri-Wound Care Topical Primary Dressing Secondary Dressing Secured With Compression Wrap Compression Stockings Add-Ons Electronic Signature(s) Signed: 01/01/2021 4:33:40 PM By: Deon Pilling Entered By: Deon Pilling on 01/01/2021 13:30:50 -------------------------------------------------------------------------------- Wound Assessment Details Patient Name: Date of Service: Andrew [name], Andrew [name]. 01/01/2021 1:15 PM Medical Record Number: [id number] Patient Account Number: [id number] Date of Birth/Sex: Treating RN: 07-17-1928 (87 y.o. [name] Primary Care  Isaiyah Feldhaus: Dimas Chyle Other Clinician: Referring Geraldean Walen: Treating Nilton Lave/Extender: Donalee Citrin in Treatment: 16 Wound Status Wound Number: 4 Primary Venous Leg Ulcer Etiology: Wound Location: Right, Proximal, Anterior Lower Leg Wound Open Wounding Event: Gradually Appeared Status: Date Acquired: 10/09/2020 Comorbid Congestive Heart Failure, Coronary Artery Disease, Hypertension, Weeks Of Treatment: 12 History: Peripheral Arterial Disease, Peripheral Venous Disease, Type II Clustered Wound: No Diabetes Wound Measurements Length: (cm) 1 Width: (cm) 0.7 Depth: (cm) 0.1 Area: (cm) 0.55 Volume: (cm) 0.055 % Reduction in Area: -485.1% % Reduction in Volume: -511.1% Epithelialization: Large (67-100%) Tunneling: No Undermining: No Wound Description Classification: Full Thickness Without Exposed Support Structures Wound Margin: Flat and Intact Exudate Amount: Medium Exudate Type: Serosanguineous Exudate Color: red, brown Foul Odor After Cleansing: No Slough/Fibrino Yes Wound Bed Granulation Amount: Large (67-100%) Exposed Structure Granulation Quality: Red Fascia Exposed: No Necrotic Amount: Small (1-33%) Fat Layer (Subcutaneous Tissue) Exposed: Yes Necrotic Quality: Adherent Slough Tendon Exposed: No Muscle Exposed: No Joint Exposed: No Bone Exposed: No Treatment Notes Wound #4 (Lower Leg) Wound Laterality: Right, Anterior, Proximal Cleanser Normal Saline Discharge Instruction: Cleanse the wound with Normal Saline prior or wound cleanser to applying [name] clean dressing using gauze sponges, not tissue or cotton balls. Soap and Water Discharge Instruction: May shower and wash wound with dial antibacterial soap and water prior to dressing change. Peri-Wound Care Sween Lotion (Moisturizing lotion) Discharge Instruction: Apply moisturizing lotion or equivalent moisterizing lotion to legs under wraps Topical Primary Dressing IODOFLEX 0.9%  Cadexomer Iodine Pad 4x6 cm Discharge Instruction: Apply Iodoflex or Iodosorb to wound bed as instructed Secondary Dressing Woven Gauze Sponge, Non-Sterile 4x4 in Discharge Instruction: Apply over primary dressing as directed. Secured With Compression Wrap Kerlix Roll 4.5x3.1 (in/yd) Discharge Instruction: Apply Kerlix and Coban compression , wrap from base of toes to knee Coban Self-Adherent Wrap 4x5 (in/yd) Discharge Instruction: Apply over Kerlix as directed. Compression Stockings Add-Ons Electronic Signature(s) Signed: 01/01/2021 4:33:40 PM By: Deon Pilling Entered By: Deon Pilling on 01/01/2021 13:31:10 -------------------------------------------------------------------------------- Wound Assessment Details Patient Name: Date of Service:  Andrew [name], Andrew [name]. 01/01/2021 1:15 PM Medical Record Number: [id number] Patient Account Number: [id number] Date of Birth/Sex: Treating RN: [birth year] (87 y.o. [name] Primary Care Tremaine Earwood: Dimas Chyle Other Clinician: Referring Shakiyla Kook: Treating Dorr Perrot/Extender: Donalee Citrin in Treatment: 16 Wound Status Wound Number: 5 Primary Diabetic Wound/Ulcer of the Lower Extremity Etiology: Wound Location: Left, Medial Lower Leg Wound Open Wounding Event: Gradually Appeared Status: Date Acquired: 10/23/2020 Comorbid Congestive Heart Failure, Coronary Artery Disease, Hypertension, Weeks Of Treatment: 10 History: Peripheral Arterial Disease, Peripheral Venous Disease, Type II Clustered Wound: Yes Diabetes Wound Measurements Length: (cm) 1 Width: (cm) 1.3 Depth: (cm) 0.1 Clustered Quantity: 2 Area: (cm) 1.021 Volume: (cm) 0.102 % Reduction in Area: -170.8% % Reduction in Volume: -168.4% Epithelialization: Large (67-100%) Tunneling: No Wound Description Classification: Grade 2 Wound Margin: Distinct, outline attached Exudate Amount: Medium Exudate Type: Serosanguineous Exudate Color: red,  brown Foul Odor After Cleansing: No Slough/Fibrino No Wound Bed Granulation Amount: Large (67-100%) Exposed Structure Granulation Quality: Red Fascia Exposed: No Necrotic Amount: None Present (0%) Fat Layer (Subcutaneous Tissue) Exposed: Yes Tendon Exposed: No Muscle Exposed: No Joint Exposed: No Bone Exposed: No Treatment Notes Wound #5 (Lower Leg) Wound Laterality: Left, Medial Cleanser Normal Saline Discharge Instruction: Cleanse the wound with Normal Saline or wound cleanser prior to applying [name] clean dressing using gauze sponges, not tissue or cotton balls. Soap and Water Discharge Instruction: May shower and wash wound with dial antibacterial soap and water prior to dressing change. Peri-Wound Care Sween Lotion (Moisturizing lotion) Discharge Instruction: Apply moisturizing lotion or equivalent moisterizing lotion to legs under wraps Topical Primary Dressing IODOFLEX 0.9% Cadexomer Iodine Pad 4x6 cm Discharge Instruction: Apply Iodoflex or Iodosorb to wound bed as instructed Secondary Dressing Woven Gauze Sponge, Non-Sterile 4x4 in Discharge Instruction: Apply over primary dressing as directed. Secured With Compression Wrap Kerlix Roll 4.5x3.1 (in/yd) Discharge Instruction: Apply Kerlix and Coban compression , wrap from base of toes to knee Coban Self-Adherent Wrap 4x5 (in/yd) Discharge Instruction: Apply over Kerlix as directed. Compression Stockings Add-Ons Electronic Signature(s) Signed: 01/01/2021 4:33:40 PM By: Deon Pilling Entered By: Deon Pilling on 01/01/2021 13:31:41 -------------------------------------------------------------------------------- Vitals Details Patient Name: Date of Service: Andrew [name], Andrew [name]. 01/01/2021 1:15 PM Medical Record Number: [id number] Patient Account Number: [id number] Date of Birth/Sex: Treating RN: 10-26-1928 (87 y.o. [name] Primary Care Reyanne Hussar: Dimas Chyle Other Clinician: Referring Stewart Sasaki: Treating  Rayquan Amrhein/Extender: Donalee Citrin in Treatment: 16 Vital Signs Time Taken: 13:21 Temperature (F): 98.4 Height (in): 67 Pulse (bpm): 71 Weight (lbs): 185 Respiratory Rate (breaths/min): 16 Body Mass Index (BMI): 29 Blood Pressure (mmHg): 126/66 Reference Range: 80 - 120 mg / dl Electronic Signature(s) Signed: 01/01/2021 4:33:40 PM By: Deon Pilling Entered By: Deon Pilling on 01/01/2021 13:30:12

## 2021-01-04 DIAGNOSIS — D631 Anemia in chronic kidney disease: Secondary | ICD-10-CM | POA: Diagnosis not present

## 2021-01-04 DIAGNOSIS — I872 Venous insufficiency (chronic) (peripheral): Secondary | ICD-10-CM | POA: Diagnosis not present

## 2021-01-04 DIAGNOSIS — E1122 Type 2 diabetes mellitus with diabetic chronic kidney disease: Secondary | ICD-10-CM | POA: Diagnosis not present

## 2021-01-04 DIAGNOSIS — E1169 Type 2 diabetes mellitus with other specified complication: Secondary | ICD-10-CM | POA: Diagnosis not present

## 2021-01-04 DIAGNOSIS — I5022 Chronic systolic (congestive) heart failure: Secondary | ICD-10-CM | POA: Diagnosis not present

## 2021-01-04 DIAGNOSIS — I130 Hypertensive heart and chronic kidney disease with heart failure and stage 1 through stage 4 chronic kidney disease, or unspecified chronic kidney disease: Secondary | ICD-10-CM | POA: Diagnosis not present

## 2021-01-04 DIAGNOSIS — N184 Chronic kidney disease, stage 4 (severe): Secondary | ICD-10-CM | POA: Diagnosis not present

## 2021-01-04 DIAGNOSIS — L89620 Pressure ulcer of left heel, unstageable: Secondary | ICD-10-CM | POA: Diagnosis not present

## 2021-01-04 DIAGNOSIS — E1151 Type 2 diabetes mellitus with diabetic peripheral angiopathy without gangrene: Secondary | ICD-10-CM | POA: Diagnosis not present

## 2021-01-07 ENCOUNTER — Other Ambulatory Visit (HOSPITAL_COMMUNITY): Payer: Self-pay

## 2021-01-07 NOTE — Progress Notes (Signed)
Paramedicine Encounter    Patient ID: Andrew [name], male    DOB: Feb 21, 1928, 87 y.o.   MRN: [id number]   Patient Care Team: [name], MD as PCP - General (Family Medicine) Larey Dresser, MD as PCP - Advanced Heart Failure (Cardiology) Larey Dresser, MD as Consulting Physician (Cardiology) Jorge Ny, LCSW as Social Worker (Licensed Clinical Social Worker) Gardiner Barefoot, DPM as Consulting Physician (Podiatry) [name], MD (Family Medicine) Patient, No Pcp Per (General Practice)  Patient Active Problem List   Diagnosis Date Noted  . Acute respiratory failure with hypercapnia ([place]) 06/02/2020  . Acute metabolic encephalopathy 40/98/1191  . CKD (chronic kidney disease), stage IV ([place]) 06/02/2020  . PAF (paroxysmal atrial fibrillation) ([place]) 06/02/2020  . Hypothyroidism 06/02/2020  . Chronic anticoagulation 06/02/2020  . Debility 06/01/2020  . Lumbar spondylosis 10/04/2018  . Chronic respiratory failure with hypoxia ([place]) 09/16/2018  . Pain of right thumb 06/19/2018  . Bilateral recurrent inguinal hernias 11/28/2017  . Carotid artery stenosis 01/12/2016  . Chronic systolic CHF (congestive heart failure) ([place]) 12/02/2015  . 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 ([place]) 10/16/2015  . PVD (peripheral vascular disease) ([place])   . Hereditary and idiopathic peripheral neuropathy 10/02/2015  . Peripelvic (lymphatic) cyst   . Pernicious anemia 07/15/2015  . Bilateral renal cysts 07/12/2015  . Lung nodule, 69mm RML CT 07/12/15 07/12/2015  . Stage 3 chronic kidney disease ([place]) 06/14/2015  . Paroxysmal atrial fibrillation ([place]) 05/28/2015  . AAA (abdominal aortic aneurysm) without rupture ([place]) 09/30/2014  . Cardiomyopathy, ischemic 05/23/2014  . DM (diabetes mellitus), type 2 with renal complications ([place]) 47/82/9562  . Depression, major, single episode, complete remission ([place]) 03/18/2010  . Hypothyroidism 02/03/2009  . Osteoarthritis 05/27/2008  .  Hyperlipidemia associated with type 2 diabetes mellitus ([place]) 06/14/2007  . Hypertension associated with diabetes ([place]) 06/14/2007  . GERD 06/14/2007    Current Outpatient Medications:  .  acetaminophen (TYLENOL) 500 MG tablet, Take 2 tablets (1,000 mg total) by mouth every 8 (eight) hours as needed., Disp: 30 tablet, Rfl: 0 .  amiodarone (PACERONE) 200 MG tablet, Take 0.5 tablets (100 mg total) by mouth daily., Disp: 45 tablet, Rfl: 3 .  apixaban (ELIQUIS) 2.5 MG TABS tablet, Take 2.5 mg by mouth 2 (two) times daily. , Disp: , Rfl:  .  atorvastatin (LIPITOR) 80 MG tablet, Take 1 tablet (80 mg total) by mouth daily., Disp: 30 tablet, Rfl: 3 .  carvedilol (COREG) 12.5 MG tablet, Take 12.5 mg by mouth 2 (two) times daily with a meal., Disp: , Rfl:  .  cilostazol (PLETAL) 100 MG tablet, Take 1 tablet (100 mg total) by mouth 2 (two) times daily., Disp: 180 tablet, Rfl: 3 .  ezetimibe (ZETIA) 10 MG tablet, TAKE (1) TABLET DAILY., Disp: 90 tablet, Rfl: 0 .  FLUoxetine (PROZAC) 20 MG capsule, TAKE (1) CAPSULE DAILY., Disp: 90 capsule, Rfl: 0 .  gabapentin (NEURONTIN) 100 MG capsule, Take 100 mg by mouth 3 (three) times daily., Disp: , Rfl:  .  glucose blood (ACCU-CHEK AVIVA PLUS) test strip, CHECK BLOOD SUGAR 4 TIMES A DAY., Disp: 100 each, Rfl: 11 .  hydrALAZINE (APRESOLINE) 50 MG tablet, Take 1 tablet (50 mg total) by mouth 3 (three) times daily., Disp: 90 tablet, Rfl: 3 .  HYDROcodone-acetaminophen (NORCO/VICODIN) 5-325 MG tablet, TAKE 1 TABLET EVERY 6 HOURS AS NEEDED FOR MODERATE PAIN. (Patient not taking: No sig reported), Disp: 12 tablet, Rfl: 0 .  isosorbide mononitrate (IMDUR)  30 MG 24 hr tablet, Take 1 tablet (30 mg total) by mouth daily., Disp: 30 tablet, Rfl: 3 .  levothyroxine (SYNTHROID) 100 MCG tablet, TAKE 1 TABLET BEFORE BREAKFAST., Disp: 90 tablet, Rfl: 0 .  metolazone (ZAROXOLYN) 2.5 MG tablet, Take 1 tablet (2.5 mg total) by mouth as directed. By [facility] Clinic (Patient not taking: No sig  reported), Disp: 3 tablet, Rfl: 0 .  Multiple Vitamins-Minerals (MULTIVITAMIN ADULTS 50+) TABS, Take 1 tablet by mouth daily., Disp: , Rfl:  .  nitroGLYCERIN (NITROSTAT) 0.4 MG SL tablet, DISSOLVE 1 TABLET UNDER TONGUE AS NEEDED FOR CHEST PAIN,MAY REPEAT IN5 MINUTES FOR 2 DOSES. (Patient not taking: No sig reported), Disp: 25 tablet, Rfl: 3 .  pantoprazole (PROTONIX) 40 MG tablet, Take 1 tablet (40 mg total) by mouth 2 (two) times daily., Disp: , Rfl:  .  potassium chloride SA (KLOR-CON) 20 MEQ tablet, Take 20 mEq by mouth daily., Disp: , Rfl:  .  tiotropium (SPIRIVA) 18 MCG inhalation capsule, Place 18 mcg into inhaler and inhale daily as needed (for wheezing)., Disp: , Rfl:  .  torsemide (DEMADEX) 20 MG tablet, Take 4 tablets (80 mg total) by mouth 2 (two) times daily., Disp: 240 tablet, Rfl: 3 Allergies  Allergen Reactions  . Metoprolol Shortness Of Breath, Palpitations and Other (See Comments)    Heart starts racing. Shallow breathing; pt states he also get skin irritation on his legs  . Metformin Hives and Swelling    On legs  . Metformin And Related Nausea And Vomiting      Social History   Socioeconomic History  . Marital status: Married    Spouse name: Not on file  . Number of children: Not on file  . Years of education: Not on file  . Highest education level: Not on file  Occupational History  . Occupation: Retired  Tobacco Use  . Smoking status: Never Smoker  . Smokeless tobacco: Never Used  . Tobacco comment: quit atleast 25 yrs ago, per pt  Vaping Use  . Vaping Use: Never used  Substance and Sexual Activity  . Alcohol use: Never    Comment: socially  . Drug use: Never  . Sexual activity: Not Currently  Other Topics Concern  . Not on file  Social History Narrative   ** Merged History Encounter **       Pt lives in [place] with spouse. Retired from [employer].  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   Social  Determinants of Health   Financial Resource Strain: Low Risk   . Difficulty of Paying Living Expenses: Not hard at all  Food Insecurity: No Food Insecurity  . Worried About Charity fundraiser in the Last Year: Never true  . Ran Out of Food in the Last Year: Never true  Transportation Needs: No Transportation Needs  . Lack of Transportation (Medical): No  . Lack of Transportation (Non-Medical): No  Physical Activity: Inactive  . Days of Exercise per Week: 0 days  . Minutes of Exercise per Session: 0 min  Stress: No Stress Concern Present  . Feeling of Stress : Not at all  Social Connections: Moderately Isolated  . Frequency of Communication with Friends and Family: More than three times a week  . Frequency of Social Gatherings with Friends and Family: More than three times a week  . Attends Religious Services: Never  . Active Member of Clubs or Organizations: No  . Attends Archivist Meetings:  Never  . Marital Status: Married  Human resources officer Violence: Not At Risk  . Fear of Current or Ex-Partner: No  . Emotionally Abused: No  . Physically Abused: No  . Sexually Abused: No    Physical Exam      Future Appointments  Date Time Provider [place]  01/14/2021  1:40 PM Larey Dresser, MD [facility] None  01/15/2021  1:30 PM Ricard Dillon, MD [facility]    BP 124/64   Pulse 71   Resp 18   Wt 169 lb (76.7 kg)   SpO2 96%   BMI 26.47 kg/m   Weight yesterday-? Last visit weight-164  Pt seen at home today. He has one whole pill box full and most of the 2nd one filled as well. He has become very forgetful with taking his meds.  He says he feels tired today, he had a pain to bottom of head/neck area but took tylenol and felt much better.  He denies c/p or dizziness. No falls.   -cilostazol, eliquis,  Hydralazine needs refilled before next visit.   Will f/u in 2 wks.    Marylouise Stacks, [name] Paramedic   01/07/21

## 2021-01-08 DIAGNOSIS — I5022 Chronic systolic (congestive) heart failure: Secondary | ICD-10-CM | POA: Diagnosis not present

## 2021-01-08 DIAGNOSIS — E1151 Type 2 diabetes mellitus with diabetic peripheral angiopathy without gangrene: Secondary | ICD-10-CM | POA: Diagnosis not present

## 2021-01-08 DIAGNOSIS — E1122 Type 2 diabetes mellitus with diabetic chronic kidney disease: Secondary | ICD-10-CM | POA: Diagnosis not present

## 2021-01-08 DIAGNOSIS — N184 Chronic kidney disease, stage 4 (severe): Secondary | ICD-10-CM | POA: Diagnosis not present

## 2021-01-08 DIAGNOSIS — I872 Venous insufficiency (chronic) (peripheral): Secondary | ICD-10-CM | POA: Diagnosis not present

## 2021-01-08 DIAGNOSIS — E1169 Type 2 diabetes mellitus with other specified complication: Secondary | ICD-10-CM | POA: Diagnosis not present

## 2021-01-08 DIAGNOSIS — I130 Hypertensive heart and chronic kidney disease with heart failure and stage 1 through stage 4 chronic kidney disease, or unspecified chronic kidney disease: Secondary | ICD-10-CM | POA: Diagnosis not present

## 2021-01-08 DIAGNOSIS — D631 Anemia in chronic kidney disease: Secondary | ICD-10-CM | POA: Diagnosis not present

## 2021-01-08 DIAGNOSIS — L89620 Pressure ulcer of left heel, unstageable: Secondary | ICD-10-CM | POA: Diagnosis not present

## 2021-01-12 ENCOUNTER — Other Ambulatory Visit (HOSPITAL_COMMUNITY): Payer: Self-pay | Admitting: Cardiology

## 2021-01-12 DIAGNOSIS — E1151 Type 2 diabetes mellitus with diabetic peripheral angiopathy without gangrene: Secondary | ICD-10-CM | POA: Diagnosis not present

## 2021-01-12 DIAGNOSIS — D631 Anemia in chronic kidney disease: Secondary | ICD-10-CM | POA: Diagnosis not present

## 2021-01-12 DIAGNOSIS — I5022 Chronic systolic (congestive) heart failure: Secondary | ICD-10-CM | POA: Diagnosis not present

## 2021-01-12 DIAGNOSIS — I872 Venous insufficiency (chronic) (peripheral): Secondary | ICD-10-CM | POA: Diagnosis not present

## 2021-01-12 DIAGNOSIS — E1122 Type 2 diabetes mellitus with diabetic chronic kidney disease: Secondary | ICD-10-CM | POA: Diagnosis not present

## 2021-01-12 DIAGNOSIS — I130 Hypertensive heart and chronic kidney disease with heart failure and stage 1 through stage 4 chronic kidney disease, or unspecified chronic kidney disease: Secondary | ICD-10-CM | POA: Diagnosis not present

## 2021-01-12 DIAGNOSIS — L89620 Pressure ulcer of left heel, unstageable: Secondary | ICD-10-CM | POA: Diagnosis not present

## 2021-01-12 DIAGNOSIS — N184 Chronic kidney disease, stage 4 (severe): Secondary | ICD-10-CM | POA: Diagnosis not present

## 2021-01-12 DIAGNOSIS — E1169 Type 2 diabetes mellitus with other specified complication: Secondary | ICD-10-CM | POA: Diagnosis not present

## 2021-01-14 ENCOUNTER — Other Ambulatory Visit: Payer: Self-pay

## 2021-01-14 ENCOUNTER — Encounter (HOSPITAL_COMMUNITY): Payer: Self-pay | Admitting: Cardiology

## 2021-01-14 ENCOUNTER — Ambulatory Visit (HOSPITAL_COMMUNITY)
Admission: RE | Admit: 2021-01-14 | Discharge: 2021-01-14 | Disposition: A | Discharge status: Home / Self Care | Payer: Medicare PPO | Source: Ambulatory Visit | Attending: Cardiology | Admitting: Cardiology

## 2021-01-14 VITALS — BP 120/60 | HR 52 | Wt 174.4 lb

## 2021-01-14 DIAGNOSIS — I2510 Atherosclerotic heart disease of native coronary artery without angina pectoris: Secondary | ICD-10-CM | POA: Insufficient documentation

## 2021-01-14 DIAGNOSIS — Z833 Family history of diabetes mellitus: Secondary | ICD-10-CM | POA: Diagnosis not present

## 2021-01-14 DIAGNOSIS — N183 Chronic kidney disease, stage 3 unspecified: Secondary | ICD-10-CM | POA: Insufficient documentation

## 2021-01-14 DIAGNOSIS — R2689 Other abnormalities of gait and mobility: Secondary | ICD-10-CM | POA: Diagnosis not present

## 2021-01-14 DIAGNOSIS — Z8249 Family history of ischemic heart disease and other diseases of the circulatory system: Secondary | ICD-10-CM | POA: Diagnosis not present

## 2021-01-14 DIAGNOSIS — Z79899 Other long term (current) drug therapy: Secondary | ICD-10-CM | POA: Insufficient documentation

## 2021-01-14 DIAGNOSIS — I130 Hypertensive heart and chronic kidney disease with heart failure and stage 1 through stage 4 chronic kidney disease, or unspecified chronic kidney disease: Secondary | ICD-10-CM | POA: Insufficient documentation

## 2021-01-14 DIAGNOSIS — R911 Solitary pulmonary nodule: Secondary | ICD-10-CM | POA: Insufficient documentation

## 2021-01-14 DIAGNOSIS — I480 Paroxysmal atrial fibrillation: Secondary | ICD-10-CM | POA: Insufficient documentation

## 2021-01-14 DIAGNOSIS — I255 Ischemic cardiomyopathy: Secondary | ICD-10-CM | POA: Diagnosis not present

## 2021-01-14 DIAGNOSIS — Z7984 Long term (current) use of oral hypoglycemic drugs: Secondary | ICD-10-CM | POA: Insufficient documentation

## 2021-01-14 DIAGNOSIS — J449 Chronic obstructive pulmonary disease, unspecified: Secondary | ICD-10-CM | POA: Diagnosis not present

## 2021-01-14 DIAGNOSIS — E039 Hypothyroidism, unspecified: Secondary | ICD-10-CM | POA: Diagnosis not present

## 2021-01-14 DIAGNOSIS — I252 Old myocardial infarction: Secondary | ICD-10-CM | POA: Insufficient documentation

## 2021-01-14 DIAGNOSIS — Z7901 Long term (current) use of anticoagulants: Secondary | ICD-10-CM | POA: Insufficient documentation

## 2021-01-14 DIAGNOSIS — E785 Hyperlipidemia, unspecified: Secondary | ICD-10-CM | POA: Diagnosis not present

## 2021-01-14 DIAGNOSIS — I739 Peripheral vascular disease, unspecified: Secondary | ICD-10-CM

## 2021-01-14 DIAGNOSIS — Z8673 Personal history of transient ischemic attack (TIA), and cerebral infarction without residual deficits: Secondary | ICD-10-CM | POA: Insufficient documentation

## 2021-01-14 DIAGNOSIS — I4892 Unspecified atrial flutter: Secondary | ICD-10-CM | POA: Diagnosis not present

## 2021-01-14 DIAGNOSIS — Z951 Presence of aortocoronary bypass graft: Secondary | ICD-10-CM | POA: Diagnosis not present

## 2021-01-14 DIAGNOSIS — I5022 Chronic systolic (congestive) heart failure: Secondary | ICD-10-CM | POA: Diagnosis not present

## 2021-01-14 DIAGNOSIS — E1122 Type 2 diabetes mellitus with diabetic chronic kidney disease: Secondary | ICD-10-CM | POA: Insufficient documentation

## 2021-01-14 LAB — CBC
HCT: 31.5 % — ABNORMAL LOW (ref 39.0–52.0)
Hemoglobin: 10.1 g/dL — ABNORMAL LOW (ref 13.0–17.0)
MCH: 31.3 pg (ref 26.0–34.0)
MCHC: 32.1 g/dL (ref 30.0–36.0)
MCV: 97.5 fL (ref 80.0–100.0)
Platelets: 149 10*3/uL — ABNORMAL LOW (ref 150–400)
RBC: 3.23 MIL/uL — ABNORMAL LOW (ref 4.22–5.81)
RDW: 14 % (ref 11.5–15.5)
WBC: 5.9 10*3/uL (ref 4.0–10.5)
nRBC: 0 % (ref 0.0–0.2)

## 2021-01-14 LAB — COMPREHENSIVE METABOLIC PANEL
ALT: 66 U/L — ABNORMAL HIGH (ref 0–44)
AST: 58 U/L — ABNORMAL HIGH (ref 15–41)
Albumin: 3.3 g/dL — ABNORMAL LOW (ref 3.5–5.0)
Alkaline Phosphatase: 82 U/L (ref 38–126)
Anion gap: 12 (ref 5–15)
BUN: 63 mg/dL — ABNORMAL HIGH (ref 8–23)
CO2: 27 mmol/L (ref 22–32)
Calcium: 9 mg/dL (ref 8.9–10.3)
Chloride: 101 mmol/L (ref 98–111)
Creatinine, Ser: 2.07 mg/dL — ABNORMAL HIGH (ref 0.61–1.24)
GFR, Estimated: 30 mL/min — ABNORMAL LOW (ref >60)
Glucose, Bld: 183 mg/dL — ABNORMAL HIGH (ref 70–99)
Potassium: 3.5 mmol/L (ref 3.5–5.1)
Sodium: 140 mmol/L (ref 135–145)
Total Bilirubin: 0.4 mg/dL (ref 0.3–1.2)
Total Protein: 6.5 g/dL (ref 6.5–8.1)

## 2021-01-14 LAB — TSH: TSH: 1.984 u[IU]/mL (ref 0.350–4.500)

## 2021-01-14 MED ORDER — TORSEMIDE 20 MG PO TABS
ORAL_TABLET | ORAL | 11 refills | Status: DC
Start: 2021-01-14 — End: 2021-04-19

## 2021-01-14 MED ORDER — EMPAGLIFLOZIN 10 MG PO TABS: 10.0000 mg | ORAL_TABLET | Freq: Every day | ORAL | 3 refills | Status: DC

## 2021-01-14 NOTE — Patient Instructions (Signed)
Labs done today. We will contact you only if your labs are abnormal.  DECREASE Torsemide to 80mg  (4 tablets) by mouth every morning and 40mg  (2 tablets) by mouth every evening.  START Jardiance 10mg  (1 tablet) by mouth daily.  No other medication changes were made. Please continue all current medications as prescribed.  Your physician recommends that you schedule a follow-up appointment in:10 days for a lab only appointment and in 3 months with Dr. [name]   If you have any questions or concerns before your next appointment please send [place] a message through [facility] or call our office at [phone].    TO LEAVE A MESSAGE FOR THE NURSE SELECT OPTION 2, PLEASE LEAVE A MESSAGE INCLUDING: . YOUR NAME . DATE OF BIRTH . CALL BACK NUMBER . REASON FOR CALL**this is important as we prioritize the call backs  YOU WILL RECEIVE A CALL BACK THE SAME DAY AS LONG AS YOU CALL BEFORE 4:00 PM   Do the following things EVERYDAY: 1) Weigh yourself in the morning before breakfast. Write it down and keep it in a log. 2) Take your medicines as prescribed 3) Eat low salt foods--Limit salt (sodium) to 2000 mg per day.  4) Stay as active as you can everyday 5) Limit all fluids for the day to less than 2 liters   At the [facility], you and your health needs are our priority. As part of our continuing mission to provide you with exceptional heart care, we have created designated Provider Care Teams. These Care Teams include your primary Cardiologist (physician) and Advanced Practice Providers (APPs- Physician Assistants and Nurse Practitioners) who all work together to provide you with the care you need, when you need it.   You may see any of the following providers on your designated Care Team at your next follow up: Marland Kitchen Dr Glori Bickers . Dr Loralie Champagne . Darrick Grinder, NP . Lyda Jester, PA . Audry Riles, PharmD   Please be sure to bring in all your medications bottles to every  appointment.

## 2021-01-15 ENCOUNTER — Telehealth (HOSPITAL_COMMUNITY): Payer: Self-pay | Admitting: Licensed Clinical Social Worker

## 2021-01-15 ENCOUNTER — Telehealth (HOSPITAL_COMMUNITY): Payer: Self-pay

## 2021-01-15 ENCOUNTER — Encounter (HOSPITAL_BASED_OUTPATIENT_CLINIC_OR_DEPARTMENT_OTHER): Payer: Medicare PPO | Admitting: Internal Medicine

## 2021-01-15 DIAGNOSIS — L97829 Non-pressure chronic ulcer of other part of left lower leg with unspecified severity: Secondary | ICD-10-CM | POA: Diagnosis not present

## 2021-01-15 DIAGNOSIS — L97822 Non-pressure chronic ulcer of other part of left lower leg with fat layer exposed: Secondary | ICD-10-CM | POA: Diagnosis not present

## 2021-01-15 DIAGNOSIS — L97422 Non-pressure chronic ulcer of left heel and midfoot with fat layer exposed: Secondary | ICD-10-CM | POA: Diagnosis not present

## 2021-01-15 DIAGNOSIS — I87333 Chronic venous hypertension (idiopathic) with ulcer and inflammation of bilateral lower extremity: Secondary | ICD-10-CM | POA: Diagnosis not present

## 2021-01-15 DIAGNOSIS — L97812 Non-pressure chronic ulcer of other part of right lower leg with fat layer exposed: Secondary | ICD-10-CM | POA: Diagnosis not present

## 2021-01-15 DIAGNOSIS — T8131XA Disruption of external operation (surgical) wound, not elsewhere classified, initial encounter: Secondary | ICD-10-CM | POA: Diagnosis not present

## 2021-01-15 DIAGNOSIS — L97811 Non-pressure chronic ulcer of other part of right lower leg limited to breakdown of skin: Secondary | ICD-10-CM | POA: Diagnosis not present

## 2021-01-15 DIAGNOSIS — E11621 Type 2 diabetes mellitus with foot ulcer: Secondary | ICD-10-CM | POA: Diagnosis not present

## 2021-01-15 DIAGNOSIS — L97818 Non-pressure chronic ulcer of other part of right lower leg with other specified severity: Secondary | ICD-10-CM | POA: Diagnosis not present

## 2021-01-15 NOTE — Progress Notes (Signed)
BLEU, MINERD ([id number]) Visit Report for 01/15/2021 Arrival Information Details Patient Name: Date of Service: Andrew [name], [name] 01/15/2021 1:30 PM Medical Record Number: [id number] Patient Account Number: [id number] Date of Birth/Sex: Treating RN: Apr 03, 1928 (87 y.o. Andrew [name], Andrew [name] Primary Care Samaiya Awadallah: Dimas Chyle Other Clinician: Referring Kerina Simoneau: Treating Frederick Klinger/Extender: Donalee Citrin in Treatment: 18 Visit Information History Since Last Visit Added or deleted any medications: No Patient Arrived: Wheel Chair Any new allergies or adverse reactions: No Arrival Time: 13:40 Had a fall or experienced change in No Accompanied By: transport activities of daily living that may affect Transfer Assistance: None risk of falls: Patient Identification Verified: Yes Signs or symptoms of abuse/neglect since last visito No Secondary Verification Process Completed: Yes Hospitalized since last visit: No Patient Requires Transmission-Based Precautions: No Implantable device outside of the clinic excluding No Patient Has Alerts: No cellular tissue based products placed in the center since last visit: Pain Present Now: No Electronic Signature(s) Signed: 01/15/2021 3:06:23 PM By: Mikeal Hawthorne EMT/HBOT/SD Entered By: Mikeal Hawthorne on 01/15/2021 13:40:42 -------------------------------------------------------------------------------- Clinic Level of Care Assessment Details Patient Name: Date of Service: Andrew [name], [name] 01/15/2021 1:30 PM Medical Record Number: [id number] Patient Account Number: [id number] Date of Birth/Sex: Treating RN: [birth year] (87 y.o. Andrew [name] Primary Care Trude Cansler: Dimas Chyle Other Clinician: Referring Kevyn Wengert: Treating Gorje Iyer/Extender: Donalee Citrin in Treatment: 18 Clinic Level of Care Assessment Items TOOL 4 Quantity Score []  - 0 Use when only an EandM is performed on FOLLOW-UP  visit ASSESSMENTS - Nursing Assessment / Reassessment X- 1 10 Reassessment of Co-morbidities (includes updates in patient status) X- 1 5 Reassessment of Adherence to Treatment Plan ASSESSMENTS - Wound and Skin A ssessment / Reassessment []  - 0 Simple Wound Assessment / Reassessment - one wound X- 2 5 Complex Wound Assessment / Reassessment - multiple wounds []  - 0 Dermatologic / Skin Assessment (not related to wound area) ASSESSMENTS - Focused Assessment X- 2 5 Circumferential Edema Measurements - multi extremities []  - 0 Nutritional Assessment / Counseling / Intervention X- 1 5 Lower Extremity Assessment (monofilament, tuning fork, pulses) []  - 0 Peripheral Arterial Disease Assessment (using hand held doppler) ASSESSMENTS - Ostomy and/or Continence Assessment and Care []  - 0 Incontinence Assessment and Management []  - 0 Ostomy Care Assessment and Management (repouching, etc.) PROCESS - Coordination of Care X - Simple Patient / Family Education for ongoing care 1 15 []  - 0 Complex (extensive) Patient / Family Education for ongoing care X- 1 10 Staff obtains Consents, Records, T Results / Process Orders est X- 1 10 Staff telephones HHA, Nursing Homes / Clarify orders / etc []  - 0 Routine Transfer to another Facility (non-emergent condition) []  - 0 Routine Hospital Admission (non-emergent condition) []  - 0 New Admissions / Biomedical engineer / Ordering NPWT Apligraf, etc. , []  - 0 Emergency Hospital Admission (emergent condition) X- 1 10 Simple Discharge Coordination []  - 0 Complex (extensive) Discharge Coordination PROCESS - Special Needs []  - 0 Pediatric / Minor Patient Management []  - 0 Isolation Patient Management []  - 0 Hearing / Language / Visual special needs []  - 0 Assessment of Community assistance (transportation, D/C planning, etc.) []  - 0 Additional assistance / Altered mentation []  - 0 Support Surface(s) Assessment (bed, cushion, seat,  etc.) INTERVENTIONS - Wound Cleansing / Measurement []  - 0 Simple Wound Cleansing - one wound X- 2 5 Complex Wound Cleansing - multiple wounds X- 1 5 Wound Imaging (photographs -  any number of wounds) []  - 0 Wound Tracing (instead of photographs) []  - 0 Simple Wound Measurement - one wound X- 2 5 Complex Wound Measurement - multiple wounds INTERVENTIONS - Wound Dressings X - Small Wound Dressing one or multiple wounds 1 10 []  - 0 Medium Wound Dressing one or multiple wounds []  - 0 Large Wound Dressing one or multiple wounds X- 1 5 Application of Medications - topical []  - 0 Application of Medications - injection INTERVENTIONS - Miscellaneous []  - 0 External ear exam []  - 0 Specimen Collection (cultures, biopsies, blood, body fluids, etc.) []  - 0 Specimen(s) / Culture(s) sent or taken to Lab for analysis []  - 0 Patient Transfer (multiple staff / Civil Service fast streamer / Similar devices) []  - 0 Simple Staple / Suture removal (25 or less) []  - 0 Complex Staple / Suture removal (26 or more) []  - 0 Hypo / Hyperglycemic Management (close monitor of Blood Glucose) []  - 0 Ankle / Brachial Index (ABI) - do not check if billed separately X- 1 5 Vital Signs Has the patient been seen at the hospital within the last three years: Yes Total Score: 130 Level Of Care: New/Established - Level 4 Electronic Signature(s) Signed: 01/15/2021 4:31:51 PM By: Baruch Gouty RN, BSN Entered By: Baruch Gouty on 01/15/2021 14:01:28 -------------------------------------------------------------------------------- Lower Extremity Assessment Details Patient Name: Date of Service: Andrew [name], [name] 01/15/2021 1:30 PM Medical Record Number: [id number] Patient Account Number: [id number] Date of Birth/Sex: Treating RN: 14-Apr-1928 (87 y.o. Andrew [name] Primary Care Shayden Gingrich: Dimas Chyle Other Clinician: Referring Samie Barclift: Treating Alexzia Kasler/Extender: Donalee Citrin in  Treatment: 18 Edema Assessment Assessed: [Left: No] [Right: No] Edema: [Left: No] [Right: No] Calf Left: Right: Point of Measurement: 44 cm From Medial Instep 32 cm 34 cm Ankle Left: Right: Point of Measurement: 10 cm From Medial Instep 21 cm 22 cm Vascular Assessment Pulses: Dorsalis Pedis Palpable: [Left:Yes] [Right:Yes] Electronic Signature(s) Signed: 01/15/2021 3:06:23 PM By: Mikeal Hawthorne EMT/HBOT/SD Signed: 01/15/2021 4:31:51 PM By: Baruch Gouty RN, BSN Entered By: Mikeal Hawthorne on 01/15/2021 13:45:13 -------------------------------------------------------------------------------- Multi Wound Chart Details Patient Name: Date of Service: Andrew [name], [name] 01/15/2021 1:30 PM Medical Record Number: [id number] Patient Account Number: [id number] Date of Birth/Sex: Treating RN: 01-16-1928 (87 y.o. Andrew [name] Primary Care Ariyah Sedlack: Dimas Chyle Other Clinician: Referring Maikol Grassia: Treating Shealee Yordy/Extender: Donalee Citrin in Treatment: 18 Vital Signs Height(in): 47 Pulse(bpm): 24 Weight(lbs): 185 Blood Pressure(mmHg): 112/66 Body Mass Index(BMI): 29 Temperature(F): 97.8 Respiratory Rate(breaths/min): 20 Photos: [4:No Photos Right, Proximal, Anterior Lower Leg] [5:No Photos Left, Medial Lower Leg] [N/A:N/A N/A] Wound Location: [4:Gradually Appeared] [5:Gradually Appeared] [N/A:N/A] Wounding Event: [4:Venous Leg Ulcer] [5:Diabetic Wound/Ulcer of the Lower] [N/A:N/A] Primary Etiology: [4:Congestive Heart Failure, Coronary Congestive Heart Failure, Coronary N/A] [5:Extremity] Comorbid History: [4:Artery Disease, Hypertension, Peripheral Arterial Disease, Peripheral Peripheral Arterial Disease, Peripheral Venous Disease, Type II Diabetes 10/09/2020] [5:Artery Disease, Hypertension, Venous Disease, Type II Diabetes 10/23/2020]  [N/A:N/A] Date Acquired: [4:14] [5:12] [N/A:N/A] Weeks of Treatment: [4:Open] [5:Open] [N/A:N/A] Wound Status:  [4:No] [5:Yes] [N/A:N/A] Clustered Wound: [4:N/A] [5:2] [N/A:N/A] Clustered Quantity: [4:1x1.1x0.1] [5:0.4x0.6x0.1] [N/A:N/A] Measurements L x W x D (cm) [4:0.864] [5:0.188] [N/A:N/A] A (cm) : rea [4:0.086] [5:0.019] [N/A:N/A] Volume (cm) : [4:-819.10%] [5:50.10%] [N/A:N/A] % Reduction in Area: [4:-855.60%] [5:50.00%] [N/A:N/A] % Reduction in Volume: [4:Full Thickness Without Exposed] [5:Grade 2] [N/A:N/A] Classification: [4:Support Structures None Present] [5:None Present] [N/A:N/A] Exudate Amount: [4:Flat and Intact] [5:Distinct, outline attached] [N/A:N/A] Wound Margin: [4:Large (67-100%)] [5:Large (67-100%)] [N/A:N/A]  Granulation Amount: [4:Red] [5:Red] [N/A:N/A] Granulation Quality: [4:Small (1-33%)] [5:None Present (0%)] [N/A:N/A] Necrotic Amount: [4:Fat Layer (Subcutaneous Tissue): Yes Fat Layer (Subcutaneous Tissue): Yes N/A] Exposed Structures: [4:Fascia: No Tendon: No Muscle: No Joint: No Bone: No Large (67-100%)] [5:Fascia: No Tendon: No Muscle: No Joint: No Bone: No Large (67-100%)] [N/A:N/A] Treatment Notes Electronic Signature(s) Signed: 01/15/2021 4:10:40 PM By: Linton Ham MD Signed: 01/15/2021 4:31:51 PM By: Baruch Gouty RN, BSN Entered By: Linton Ham on 01/15/2021 14:02:22 -------------------------------------------------------------------------------- Multi-Disciplinary Care Plan Details Patient Name: Date of Service: Andrew [name], [name] 01/15/2021 1:30 PM Medical Record Number: [id number] Patient Account Number: [id number] Date of Birth/Sex: Treating RN: 1928-05-02 (87 y.o. Andrew [name] Primary Care Kasidy Gianino: Dimas Chyle Other Clinician: Referring Mayela Bullard: Treating Tamecca Artiga/Extender: Donalee Citrin in Treatment: 18 Active Inactive Wound/Skin Impairment Nursing Diagnoses: Impaired tissue integrity Goals: Patient/caregiver will verbalize understanding of skin care regimen Date Initiated: 10/23/2020 Target  Resolution Date: 01/29/2021 Goal Status: Active Ulcer/skin breakdown will have a volume reduction of 50% by week 8 Date Initiated: 09/11/2020 Date Inactivated: 10/23/2020 Target Resolution Date: 10/16/2020 Goal Status: Unmet Unmet Reason: PAD Interventions: Provide education on ulcer and skin care Notes: Electronic Signature(s) Signed: 01/15/2021 4:31:51 PM By: Baruch Gouty RN, BSN Entered By: Baruch Gouty on 01/15/2021 13:57:29 -------------------------------------------------------------------------------- Pain Assessment Details Patient Name: Date of Service: Andrew [name], [name] 01/15/2021 1:30 PM Medical Record Number: [id number] Patient Account Number: [id number] Date of Birth/Sex: Treating RN: 01-06-1928 (87 y.o. Andrew [name] Primary Care Divine Imber: Dimas Chyle Other Clinician: Referring Kishon Garriga: Treating Deneka Greenwalt/Extender: Donalee Citrin in Treatment: 18 Active Problems Location of Pain Severity and Description of Pain Patient Has Paino No Site Locations With Dressing Change: No Pain Management and Medication Current Pain Management: Electronic Signature(s) Signed: 01/15/2021 3:06:23 PM By: Mikeal Hawthorne EMT/HBOT/SD Signed: 01/15/2021 4:31:51 PM By: Baruch Gouty RN, BSN Entered By: Mikeal Hawthorne on 01/15/2021 13:42:36 -------------------------------------------------------------------------------- Patient/Caregiver Education Details Patient Name: Date of Service: Andrew [name], [name] 1/21/2022andnbsp1:30 PM Medical Record Number: [id number] Patient Account Number: [id number] Date of Birth/Gender: Treating RN: [birth year] (87 y.o. Andrew [name] Primary Care Physician: Dimas Chyle Other Clinician: Referring Physician: Treating Physician/Extender: Donalee Citrin in Treatment: 18 Education Assessment Education Provided To: Patient Education Topics Provided Venous: Methods: Explain/Verbal Responses:  Reinforcements needed, State content correctly Wound/Skin Impairment: Methods: Explain/Verbal Responses: Reinforcements needed, State content correctly Electronic Signature(s) Signed: 01/15/2021 4:31:51 PM By: Baruch Gouty RN, BSN Entered By: Baruch Gouty on 01/15/2021 13:54:57 -------------------------------------------------------------------------------- Wound Assessment Details Patient Name: Date of Service: Andrew [name], [name] 01/15/2021 1:30 PM Medical Record Number: [id number] Patient Account Number: [id number] Date of Birth/Sex: Treating RN: 1928/02/22 (87 y.o. Andrew [name] Primary Care Roselynn Whitacre: Dimas Chyle Other Clinician: Referring Genetta Fiero: Treating Jodie Cavey/Extender: Donalee Citrin in Treatment: 18 Wound Status Wound Number: 4 Primary Venous Leg Ulcer Etiology: Wound Location: Right, Proximal, Anterior Lower Leg Wound Open Wounding Event: Gradually Appeared Status: Date Acquired: 10/09/2020 Comorbid Congestive Heart Failure, Coronary Artery Disease, Hypertension, Weeks Of Treatment: 14 History: Peripheral Arterial Disease, Peripheral Venous Disease, Type II Clustered Wound: No Diabetes Wound Measurements Length: (cm) 1 Width: (cm) 1.1 Depth: (cm) 0.1 Area: (cm) 0.864 Volume: (cm) 0.086 % Reduction in Area: -819.1% % Reduction in Volume: -855.6% Epithelialization: Large (67-100%) Tunneling: No Undermining: No Wound Description Classification: Full Thickness Without Exposed Support Structures Wound Margin: Flat and Intact Exudate Amount: None Present Foul Odor After Cleansing: No Slough/Fibrino No Wound Bed Granulation Amount:  Large (67-100%) Exposed Structure Granulation Quality: Red Fascia Exposed: No Necrotic Amount: Small (1-33%) Fat Layer (Subcutaneous Tissue) Exposed: Yes Necrotic Quality: Adherent Slough Tendon Exposed: No Muscle Exposed: No Joint Exposed: No Bone Exposed: No Electronic  Signature(s) Signed: 01/15/2021 3:06:23 PM By: Mikeal Hawthorne EMT/HBOT/SD Signed: 01/15/2021 4:31:51 PM By: Baruch Gouty RN, BSN Entered By: Mikeal Hawthorne on 01/15/2021 13:47:29 -------------------------------------------------------------------------------- Wound Assessment Details Patient Name: Date of Service: Andrew [name], [name] 01/15/2021 1:30 PM Medical Record Number: [id number] Patient Account Number: [id number] Date of Birth/Sex: Treating RN: [birth year] (87 y.o. Andrew [name] Primary Care Bellina Tokarczyk: Dimas Chyle Other Clinician: Referring Mary-Ann Pennella: Treating Isella Slatten/Extender: Donalee Citrin in Treatment: 18 Wound Status Wound Number: 5 Primary Diabetic Wound/Ulcer of the Lower Extremity Etiology: Wound Location: Left, Medial Lower Leg Wound Open Wounding Event: Gradually Appeared Status: Date Acquired: 10/23/2020 Comorbid Congestive Heart Failure, Coronary Artery Disease, Hypertension, Weeks Of Treatment: 12 History: Peripheral Arterial Disease, Peripheral Venous Disease, Type II Clustered Wound: Yes Diabetes Wound Measurements Length: (cm) 0.4 Width: (cm) 0.6 Depth: (cm) 0.1 Clustered Quantity: 2 Area: (cm) 0.188 Volume: (cm) 0.019 % Reduction in Area: 50.1% % Reduction in Volume: 50% Epithelialization: Large (67-100%) Tunneling: No Undermining: No Wound Description Classification: Grade 2 Wound Margin: Distinct, outline attached Exudate Amount: None Present Foul Odor After Cleansing: No Slough/Fibrino No Wound Bed Granulation Amount: Large (67-100%) Exposed Structure Granulation Quality: Red Fascia Exposed: No Necrotic Amount: None Present (0%) Fat Layer (Subcutaneous Tissue) Exposed: Yes Tendon Exposed: No Muscle Exposed: No Joint Exposed: No Bone Exposed: No Electronic Signature(s) Signed: 01/15/2021 3:06:23 PM By: Mikeal Hawthorne EMT/HBOT/SD Signed: 01/15/2021 4:31:51 PM By: Baruch Gouty RN, BSN Entered By: Mikeal Hawthorne on 01/15/2021 13:46:27 -------------------------------------------------------------------------------- Vitals Details Patient Name: Date of Service: Andrew [name], [name] 01/15/2021 1:30 PM Medical Record Number: [id number] Patient Account Number: [id number] Date of Birth/Sex: Treating RN: Dec 12, 1928 (87 y.o. Andrew [name] Primary Care Marciel Offenberger: Dimas Chyle Other Clinician: Referring Nguyen Todorov: Treating Savaya Hakes/Extender: Donalee Citrin in Treatment: 18 Vital Signs Time Taken: 13:40 Temperature (F): 97.8 Height (in): 67 Pulse (bpm): 76 Weight (lbs): 185 Respiratory Rate (breaths/min): 20 Body Mass Index (BMI): 29 Blood Pressure (mmHg): 112/66 Reference Range: 80 - 120 mg / dl Electronic Signature(s) Signed: 01/15/2021 3:06:23 PM By: Mikeal Hawthorne EMT/HBOT/SD Entered By: Mikeal Hawthorne on 01/15/2021 13:42:28

## 2021-01-15 NOTE — Progress Notes (Signed)
[name], [name] ([id number]) Visit Report for 01/15/2021 HPI Details Patient Name: Date of Service: Andrew [name], [name] 01/15/2021 1:30 PM Medical Record Number: [id number] Patient Account Number: [id number] Date of Birth/Sex: Treating RN: Jan 03, 1928 (87 y.o. [name] Primary Care Provider: Dimas Chyle Other Clinician: Referring Provider: Treating Provider/Extender: Donalee Citrin in Treatment: 18 History of Present Illness HPI Description: 09/11/2020 ADMISSION This is a soon-to-be 87 year old man who was hospitalized in June and subsequently spent time at [place] home. At some point he developed a pressure ulcer on his left heel. On discharge he was seen by podiatry and referred to Dr. Donzetta Matters with regards to the possibility of PAD. The patient underwent angiography on 08/10/2020 this showed heavily calcified vessels. The common iliac artery on the left appeared to have calcific disease but there was no gradient. Left common external leg arteries were heavily tortuous left lower extremity limited angiography developed demonstrated a SFA to be helped heavily calcified throughout with multiple greater than 50% stenosis but never frankly occludes he was not felt to be a candidate for endovascular repair. I think they had a minimal look at the right side and it appeared that the SFA was occluded and reconstitutes above the knee. The overall feeling was that if he could not heal his left heel ulcer he will eventually best be served by a left above-knee amputation The last I can see is that he should have been using a Santyl wet-to-dry dressing to the heel. During his stay with podiatry was also noted that he had venous insufficiency and I think venous insufficiency wounds. His wife tells me that he has been recommended for compression stockings in the past but the patient refused, he has wounds on his bilateral anterior lower legs right greater than the  left. Chronic venous inflammation and dry flaking skin. Past medical history; heart failure, right heel wound, paroxysmal atrial fibrillation on Eliquis, abdominal aortic aneurysm with previous repair, COPD, DVT, melanoma on his back, diabetes with a recent hemoglobin A1c of 7.3 however he is not on any treatment [type II], PAD, coronary artery disease, hypertension The patient's most recent arterial studies were on 05/28/2020. This showed an ABI on the right at 1.12 but monophasic waveforms and absent waveforms at the posterior tibial artery. On the left his ABI was 1.47 [noncompressible] he had triphasic waveforms at the ATA and monophasic at the PTA. His TBI on the right was 1.12 on the left and 0.97. It was felt his ABIs were falsely elevated 9/24. The patient followed up with Dr. Donzetta Matters and had noninvasive arterial studies. On the right his ABIs were noncompressible monophasic waveforms with a TBI of 1.45 and a right great toe pressure of 172. On the left he had monophasic waveforms with noncompressible ABIs TBI of 0.79 and a left great toe pressure of 94. He will be seen in follow-up by Dr. Donzetta Matters in a month's time. The patient also has very significant chronic venous insufficiency 10/8; patient with known PAD. He came in with bilateral lower extremity wounds likely secondary to venous stasis. Still has one on the upper right tibial area. The area on the left leg is healed however he still has an area on the left heel. This needed an extensive debridement today. His wife brings in notes apparently Dr. March Rummage ordered an MRI of the left heel. I was unaware of this. I was not aware that he did not been seen by Dr. March Rummage and I do  not have any records of this. Apparently MRI called to set up the appointment. I will need to look through epic on this 10/15; this is a patient with known PAD who was revascularized. He also has venous stasis disease. The area that was on the right anterior tibia has  actually close down although he has a still a small area on the upper anterior tibia. 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 We have been using Iodoflex on this area and he is required debridements. We tried to get her to agree to an MRI that have been ordered by Dr. March Rummage. However she did not get back to the MRI people or they have not called her back. We will reorder this. He is quite tender around this wound and I wonder about osteomyelitis. 10/29; MRI. Fortunately this did not show any evidence of abscess or osteomyelitis in the left heel. Vein and vascular on 10/21; this is a patient who is previously had an angiogram. He had a heavily calcified aorta and iliac vessels as well as the SFA. He had multiple areas of diffuse 50% stenosis. No interventions were apparently done by Dr. Donzetta Matters he does have an occluded right SFA but with above-knee reconstitution. He was not felt to be a candidate for revascularization for his diffuse areas of greater than 50% stenosis. It was not felt that these were amenable to intervention Since he was last here he went to see dermatology. I believe he had skin cancer extractions from the right area anteriorly and 2 on the left medial. 11/12; left heel actually looks somewhat better but still requiring debridement. We've been using Iodoflex Right anterior mid tibia surgical extraction for a skin cancer. This also required debridement The area on the left lower medial leg looks a lot better and is just about fully epithelialized 12/3; pressure ulcer on the left heel in the setting of PAD. This looks better still requiring debridement we have been using Iodoflex Right anterior mid tibia surgical extraction for cancer this also continually requires debridement although the wound measures  smaller The area on the left lower medial leg had much more eschar on this than last time this is a deterioration. Notable for the fact that home health only wrap the leg halfway and actually had one of the wraps at the level of the wound on the right anterior. 12/17; the area on the left heel is just about closed. The area on the right anterior tibia looks much healthier and is smaller. The area on the left medial ankle I think is also smaller. We have been using Iodoflex to all areas under 3 layer compression bilaterally. He has home health 1/7; left heel is healed. He has an area on the right anterior tibia and the left medial lower leg. Significant chronic venous insufficiency he has home health 1/21; left heel remains healed which is his original wound. He is yet area on the right anterior tibia which is smaller and an area on the left medial lower leg just above the medial malleolus this also looks smaller. He has significant chronic venous insufficiency Electronic Signature(s) Signed: 01/15/2021 4:10:40 PM By: Linton Ham MD Signed: 01/15/2021 4:10:40 PM By: Linton Ham MD Entered By: Linton Ham on 01/15/2021 14:03:12 -------------------------------------------------------------------------------- Physical Exam Details Patient Name: Date of Service:  KIO RPES, [name] 01/15/2021 1:30 PM Medical Record Number: [id number] Patient Account Number: [id number] Date of Birth/Sex: Treating RN: 03-14-1928 (87 y.o. [name] Primary Care Provider: Dimas Chyle Other Clinician: Referring Provider: Treating Provider/Extender: Donalee Citrin in Treatment: 18 Constitutional Sitting or standing Blood Pressure is within target range for patient.. Pulse regular and within target range for patient.Marland Kitchen Respirations regular, non-labored and within target range.. Temperature is normal and within the target range for the patient.Marland Kitchen Appears in no  distress. Cardiovascular Pedal pulses are palpable. Chronic venous insufficiency changes however the edema is well controlled. Notes Wound exam; we have excellent edema control and his chronic venous insufficiency. His heel is carefully inspected there is no open wound. He does have an area on the right anterior lower leg and the left medial just above the medial malleolus. Under illumination both of these look healthy. Pedal pulses are palpable Electronic Signature(s) Signed: 01/15/2021 4:10:40 PM By: Linton Ham MD Entered By: Linton Ham on 01/15/2021 14:04:20 -------------------------------------------------------------------------------- Physician Orders Details Patient Name: Date of Service: Andrew [name], [name] 01/15/2021 1:30 PM Medical Record Number: [id number] Patient Account Number: [id number] Date of Birth/Sex: Treating RN: 03-25-1928 (87 y.o. [name] Primary Care Provider: Dimas Chyle Other Clinician: Referring Provider: Treating Provider/Extender: Donalee Citrin in Treatment: [phone] Verbal / Phone Orders: No Diagnosis Coding ICD-10 Coding Code Description E11.621 Type 2 diabetes mellitus with foot ulcer L97.422 Non-pressure chronic ulcer of left heel and midfoot with fat layer exposed I87.333 Chronic venous hypertension (idiopathic) with ulcer and inflammation of bilateral lower extremity L97.811 Non-pressure chronic ulcer of other part of right lower leg limited to breakdown of skin L97.818 Non-pressure chronic ulcer of other part of right lower leg with other specified severity L97.829 Non-pressure chronic ulcer of other part of left lower leg with unspecified severity T81.31XA Disruption of external operation (surgical) wound, not elsewhere classified, initial encounter Follow-up Appointments Return Appointment in 2 weeks. Bathing/ Shower/ Hygiene May shower with protection but do not get wound dressing(s) wet. Edema Control -  Lymphedema / SCD / Other Bilateral Lower Extremities Elevate legs to the level of the heart or above for 30 minutes daily and/or when sitting, a frequency of: - throughout the day Avoid standing for long periods of time. Exercise regularly [place] wound care orders this week; continue Home Health for wound care. May utilize formulary equivalent dressing for wound treatment orders unless otherwise specified. - change to hydrofera blue classic Other Home Health Orders/Instructions: - Medi Home Wound Treatment Wound #4 - Lower Leg Wound Laterality: Right, Anterior, Proximal Cleanser: Normal Saline ([place]) 2 x Per Week/30 Days Discharge Instructions: Cleanse the wound with Normal Saline prior or wound cleanser to applying a clean dressing using gauze sponges, not tissue or cotton balls. Cleanser: Soap and Water [facility]) 2 x Per Week/30 Days Discharge Instructions: May shower and wash wound with dial antibacterial soap and water prior to dressing change. Peri-Wound Care: Sween Lotion (Moisturizing lotion) (Home Health) 2 x Per Week/30 Days Discharge Instructions: Apply moisturizing lotion or equivalent moisterizing lotion to legs under wraps Prim Dressing: Hydrofera Blue Classic Foam, 2x2 in (Home Health) 2 x Per Week/30 Days ary Discharge Instructions: Moisten with saline prior to applying to wound bed Secondary Dressing: Woven Gauze Sponge, Non-Sterile 4x4 in (Home Health) 2 x Per Week/30 Days Discharge Instructions: Apply over primary dressing as directed. Compression Wrap: Kerlix Roll 4.5x3.1 (in/yd) (Home Health) 2 x Per Week/30  Days Discharge Instructions: Apply Kerlix and Coban compression , wrap from base of toes to knee Compression Wrap: Coban Self-Adherent Wrap 4x5 (in/yd) (Home Health) 2 x Per Week/30 Days Discharge Instructions: Apply over Kerlix as directed. Wound #5 - Lower Leg Wound Laterality: Left, Medial Cleanser: Normal Saline (Home Health) 2 x Per Week/30  Days Discharge Instructions: Cleanse the wound with Normal Saline prior or wound cleanser to applying a clean dressing using gauze sponges, not tissue or cotton balls. Cleanser: Soap and Water [facility]) 2 x Per Week/30 Days Discharge Instructions: May shower and wash wound with dial antibacterial soap and water prior to dressing change. Peri-Wound Care: Sween Lotion (Moisturizing lotion) (Home Health) 2 x Per Week/30 Days Discharge Instructions: Apply moisturizing lotion or equivalent moisterizing lotion to legs under wraps Prim Dressing: Hydrofera Blue Classic Foam, 2x2 in (Home Health) 2 x Per Week/30 Days ary Discharge Instructions: Moisten with saline prior to applying to wound bed Secondary Dressing: Woven Gauze Sponge, Non-Sterile 4x4 in (Home Health) 2 x Per Week/30 Days Discharge Instructions: Apply over primary dressing as directed. Compression Wrap: Kerlix Roll 4.5x3.1 (in/yd) (Home Health) 2 x Per Week/30 Days Discharge Instructions: Apply Kerlix and Coban compression , wrap from base of toes to knee Compression Wrap: Coban Self-Adherent Wrap 4x5 (in/yd) (Home Health) 2 x Per Week/30 Days Discharge Instructions: Apply over Kerlix as directed. Electronic Signature(s) Signed: 01/15/2021 4:10:40 PM By: Linton Ham MD Signed: 01/15/2021 4:31:51 PM By: Baruch Gouty RN, BSN Entered By: Baruch Gouty on 01/15/2021 14:00:39 -------------------------------------------------------------------------------- Problem List Details Patient Name: Date of Service: Andrew [name], [name] 01/15/2021 1:30 PM Medical Record Number: [id number] Patient Account Number: [id number] Date of Birth/Sex: Treating RN: January 12, 1928 (87 y.o. [name], Vaughan Basta Primary Care Provider: Dimas Chyle Other Clinician: Referring Provider: Treating Provider/Extender: Donalee Citrin in Treatment: 18 Active Problems ICD-10 Encounter Code Description Active Date MDM Diagnosis E11.621  Type 2 diabetes mellitus with foot ulcer 09/11/2020 No Yes I87.333 Chronic venous hypertension (idiopathic) with ulcer and inflammation of 09/11/2020 No Yes bilateral lower extremity L97.811 Non-pressure chronic ulcer of other part of right lower leg limited to breakdown 09/11/2020 No Yes of skin L97.829 Non-pressure chronic ulcer of other part of left lower leg with unspecified 10/23/2020 No Yes severity T81.31XA Disruption of external operation (surgical) wound, not elsewhere classified, 10/23/2020 No Yes initial encounter Inactive Problems ICD-10 Code Description Active Date Inactive Date L97.821 Non-pressure chronic ulcer of other part of left lower leg limited to breakdown of skin 09/11/2020 09/11/2020 L97.422 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 01/15/2021 1:30 PM Medical Record Number: [id number] Patient Account Number: [id number] Date of Birth/Sex: Treating RN: 1928/08/26 (87 y.o. [name] Primary Care Provider: Dimas Chyle Other Clinician: Referring Provider: Treating Provider/Extender: Donalee Citrin in Treatment: 18 Subjective History of Present Illness (HPI) 09/11/2020 ADMISSION This is a soon-to-be 87 year old man who was hospitalized in June and subsequently spent time at [place] home. At some point he developed a pressure ulcer on his left heel. On discharge he was seen by podiatry and referred to Dr. Donzetta Matters with regards to the possibility of PAD. The patient  underwent  angiography on 08/10/2020 this showed heavily calcified vessels. The common iliac artery on the left appeared to have calcific disease but there was no gradient. Left common external leg arteries were heavily tortuous left lower extremity limited angiography developed demonstrated a SFA to be helped heavily calcified throughout with multiple greater than 50% stenosis but never frankly occludes he was not felt to be a candidate for endovascular repair. I think they had a minimal look at the right side and it appeared that the SFA was occluded and reconstitutes above the knee. The overall feeling was that if he could not heal his left heel ulcer he will eventually best be served by a left above-knee amputation The last I can see is that he should have been using a Santyl wet-to-dry dressing to the heel. During his stay with podiatry was also noted that he had venous insufficiency and I think venous insufficiency wounds. His wife tells me that he has been recommended for compression stockings in the past but the patient refused, he has wounds on his bilateral anterior lower legs right greater than the left. Chronic venous inflammation and dry flaking skin. Past medical history; heart failure, right heel wound, paroxysmal atrial fibrillation on Eliquis, abdominal aortic aneurysm with previous repair, COPD, DVT, melanoma on his back, diabetes with a recent hemoglobin A1c of 7.3 however he is not on any treatment [type II], PAD, coronary artery disease, hypertension The patient's most recent arterial studies were on 05/28/2020. This showed an ABI on the right at 1.12 but monophasic waveforms and absent waveforms at the posterior tibial artery. On the left his ABI was 1.47 [noncompressible] he had triphasic waveforms at the ATA and monophasic at the PTA. His TBI on the right was 1.12 on the left and 0.97. It was felt his ABIs were falsely elevated 9/24. The patient followed up with Dr. Donzetta Matters and had  noninvasive arterial studies. On the right his ABIs were noncompressible monophasic waveforms with a TBI of 1.45 and a right great toe pressure of 172. On the left he had monophasic waveforms with noncompressible ABIs TBI of 0.79 and a left great toe pressure of 94. He will be seen in follow-up by Dr. Donzetta Matters in a month's time. The patient also has very significant chronic venous insufficiency 10/8; patient with known PAD. He came in with bilateral lower extremity wounds likely secondary to venous stasis. Still has one on the upper right tibial area. The area on the left leg is healed however he still has an area on the left heel. This needed an extensive debridement today. His wife brings in notes apparently Dr. March Rummage ordered an MRI of the left heel. I was unaware of this. I was not aware that he did not been seen by Dr. March Rummage and I do not have any records of this. Apparently MRI called to set up the appointment. I will need to look through epic on this 10/15; this is a patient with known PAD who was revascularized. He also has venous stasis disease. The area that was on the right anterior tibia has actually close down although he has a still a small area on the upper anterior tibia. 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 We have been using Iodoflex on this area and he is required debridements. We tried to  get her to agree to an MRI that have been ordered by Dr. March Rummage. However she did not get back to the MRI people or they have not called her back. We will reorder this. He is quite tender around this wound and I wonder about osteomyelitis. 10/29; MRI. Fortunately this did not show any evidence of abscess or osteomyelitis in the left heel. Vein and vascular on 10/21; this is a patient who is previously had an angiogram. He had  a heavily calcified aorta and iliac vessels as well as the SFA. He had multiple areas of diffuse 50% stenosis. No interventions were apparently done by Dr. Donzetta Matters he does have an occluded right SFA but with above-knee reconstitution. He was not felt to be a candidate for revascularization for his diffuse areas of greater than 50% stenosis. It was not felt that these were amenable to intervention Since he was last here he went to see dermatology. I believe he had skin cancer extractions from the right area anteriorly and 2 on the left medial. 11/12; left heel actually looks somewhat better but still requiring debridement. We've been using Iodoflex ooRight anterior mid tibia surgical extraction for a skin cancer. This also required debridement ooThe area on the left lower medial leg looks a lot better and is just about fully epithelialized 12/3; pressure ulcer on the left heel in the setting of PAD. This looks better still requiring debridement we have been using Iodoflex ooRight anterior mid tibia surgical extraction for cancer this also continually requires debridement although the wound measures smaller ooThe area on the left lower medial leg had much more eschar on this than last time this is a deterioration. ooNotable for the fact that home health only wrap the leg halfway and actually had one of the wraps at the level of the wound on the right anterior. 12/17; the area on the left heel is just about closed. The area on the right anterior tibia looks much healthier and is smaller. The area on the left medial ankle I think is also smaller. We have been using Iodoflex to all areas under 3 layer compression bilaterally. He has home health 1/7; left heel is healed. He has an area on the right anterior tibia and the left medial lower leg. Significant chronic venous insufficiency he has home health 1/21; left heel remains healed which is his original wound. He is yet area on the right anterior tibia  which is smaller and an area on the left medial lower leg just above the medial malleolus this also looks smaller. He has significant chronic venous insufficiency Objective Constitutional Sitting or standing Blood Pressure is within target range for patient.. Pulse regular and within target range for patient.Marland Kitchen Respirations regular, non-labored and within target range.. Temperature is normal and within the target range for the patient.Marland Kitchen Appears in no distress. Vitals Time Taken: 1:40 PM, Height: 67 in, Weight: 185 lbs, BMI: 29, Temperature: 97.8 F, Pulse: 76 bpm, Respiratory Rate: 20 breaths/min, Blood Pressure: 112/66 mmHg. Cardiovascular Pedal pulses are palpable. Chronic venous insufficiency changes however the edema is well controlled. General Notes: Wound exam; we have excellent edema control and his chronic venous insufficiency. His heel is carefully inspected there is no open wound. He does have an area on the right anterior lower leg and the left medial just above the medial malleolus. Under illumination both of these look healthy. Pedal pulses are palpable Integumentary (Hair, Skin) Wound #4 status is Open. Original cause of wound was Gradually Appeared. The  wound is located on the Right,Proximal,Anterior Lower Leg. The wound measures 1cm length x 1.1cm width x 0.1cm depth; 0.864cm^2 area and 0.086cm^3 volume. There is Fat Layer (Subcutaneous Tissue) exposed. There is no tunneling or undermining noted. There is a none present amount of drainage noted. The wound margin is flat and intact. There is large (67-100%) red granulation within the wound bed. There is a small (1-33%) amount of necrotic tissue within the wound bed including Adherent Slough. Wound #5 status is Open. Original cause of wound was Gradually Appeared. The wound is located on the Left,Medial Lower Leg. The wound measures 0.4cm length x 0.6cm width x 0.1cm depth; 0.188cm^2 area and 0.019cm^3 volume. There is Fat Layer  (Subcutaneous Tissue) exposed. There is no tunneling or undermining noted. There is a none present amount of drainage noted. The wound margin is distinct with the outline attached to the wound base. There is large (67-100%) red granulation within the wound bed. There is no necrotic tissue within the wound bed. Assessment Active Problems ICD-10 Type 2 diabetes mellitus with foot ulcer Chronic venous hypertension (idiopathic) with ulcer and inflammation of bilateral lower extremity Non-pressure chronic ulcer of other part of right lower leg limited to breakdown of skin Non-pressure chronic ulcer of other part of left lower leg with unspecified severity Disruption of external operation (surgical) wound, not elsewhere classified, initial encounter Plan Follow-up Appointments: Return Appointment in 2 weeks. Bathing/ Shower/ Hygiene: May shower with protection but do not get wound dressing(s) wet. Edema Control - Lymphedema / SCD / Other: Elevate legs to the level of the heart or above for 30 minutes daily and/or when sitting, a frequency of: - throughout the day Avoid standing for long periods of time. Exercise regularly Home Health: New wound care orders this week; continue Home Health for wound care. May utilize formulary equivalent dressing for wound treatment orders unless otherwise specified. - change to hydrofera blue classic Other Home Health Orders/Instructions: - [place] #4: - Lower Leg Wound Laterality: Right, Anterior, Proximal Cleanser: Normal Saline ([place]) 2 x Per Week/30 Days Discharge Instructions: Cleanse the wound with Normal Saline prior or wound cleanser to applying a clean dressing using gauze sponges, not tissue or cotton balls. Cleanser: Soap and Water [facility]) 2 x Per Week/30 Days Discharge Instructions: May shower and wash wound with dial antibacterial soap and water prior to dressing change. Peri-Wound Care: Sween Lotion (Moisturizing lotion)  (Home Health) 2 x Per Week/30 Days Discharge Instructions: Apply moisturizing lotion or equivalent moisterizing lotion to legs under wraps Prim Dressing: Hydrofera Blue Classic Foam, 2x2 in (Home Health) 2 x Per Week/30 Days ary Discharge Instructions: Moisten with saline prior to applying to wound bed Secondary Dressing: Woven Gauze Sponge, Non-Sterile 4x4 in (Home Health) 2 x Per Week/30 Days Discharge Instructions: Apply over primary dressing as directed. Com pression Wrap: Kerlix Roll 4.5x3.1 (in/yd) (Home Health) 2 x Per Week/30 Days Discharge Instructions: Apply Kerlix and Coban compression , wrap from base of toes to knee Com pression Wrap: Coban Self-Adherent Wrap 4x5 (in/yd) (Home Health) 2 x Per Week/30 Days Discharge Instructions: Apply over Kerlix as directed. WOUND #5: - Lower Leg Wound Laterality: Left, Medial Cleanser: Normal Saline (Home Health) 2 x Per Week/30 Days Discharge Instructions: Cleanse the wound with Normal Saline prior or wound cleanser to applying a clean dressing using gauze sponges, not tissue or cotton balls. Cleanser: Soap and Water [facility]) 2 x Per Week/30 Days Discharge Instructions: May shower and wash wound  with dial antibacterial soap and water prior to dressing change. Peri-Wound Care: Sween Lotion (Moisturizing lotion) (Home Health) 2 x Per Week/30 Days Discharge Instructions: Apply moisturizing lotion or equivalent moisterizing lotion to legs under wraps Prim Dressing: Hydrofera Blue Classic Foam, 2x2 in (Home Health) 2 x Per Week/30 Days ary Discharge Instructions: Moisten with saline prior to applying to wound bed Secondary Dressing: Woven Gauze Sponge, Non-Sterile 4x4 in (Home Health) 2 x Per Week/30 Days Discharge Instructions: Apply over primary dressing as directed. Com pression Wrap: Kerlix Roll 4.5x3.1 (in/yd) (Home Health) 2 x Per Week/30 Days Discharge Instructions: Apply Kerlix and Coban compression , wrap from base of toes to  knee Com pression Wrap: Coban Self-Adherent Wrap 4x5 (in/yd) (Home Health) 2 x Per Week/30 Days Discharge Instructions: Apply over Kerlix as directed. 1. I have changed the primary dressing from Iodoflex to [facility]. 2. Still under the same compression 3. The patient is going to need compression stockings 20/30. I am hopeful he will be able to get these on at home Electronic Signature(s) Signed: 01/15/2021 4:10:40 PM By: Linton Ham MD Entered By: Linton Ham on 01/15/2021 14:05:03 -------------------------------------------------------------------------------- SuperBill Details Patient Name: Date of Service: Andrew [name], [name] 01/15/2021 Medical Record Number: [id number] Patient Account Number: [id number] Date of Birth/Sex: Treating RN: 09-23-1928 (87 y.o. 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 I87.333 Chronic venous hypertension (idiopathic) with ulcer and inflammation of bilateral lower extremity L97.811 Non-pressure chronic ulcer of other part of right lower leg limited to breakdown of skin L97.818 Non-pressure chronic ulcer of other part of right lower leg with other specified severity L97.829 Non-pressure chronic ulcer of other part of left lower leg with unspecified severity T81.31XA Disruption of external operation (surgical) wound, not elsewhere classified, initial encounter Facility Procedures CPT4 Code: 55374827 Description: 99214 - WOUND CARE VISIT-LEV 4 EST PT Modifier: Quantity: 1 Physician Procedures : CPT4 Code Description Modifier [id number] - WC PHYS LEVEL 3 - EST PT ICD-10 Diagnosis Description L97.811 Non-pressure chronic ulcer of  other part of right lower leg limited to breakdown of skin L97.829 Non-pressure chronic ulcer of other part of  left lower leg with unspecified severity I87.333 Chronic venous hypertension (idiopathic) with ulcer and inflammation of bilateral lower extremity Quantity: 1 Electronic Signature(s) Signed: 01/15/2021 4:10:40 PM By: Linton Ham MD Entered By: Linton Ham on 01/15/2021 14:05:26

## 2021-01-15 NOTE — Telephone Encounter (Signed)
Community Paramedic informed CSW that pt started on Jardiance during last appt but it is $80/month which would be somewhat expensive for them to maintain.  CSW informed paramedic that pt could try applying for [employer] program for jardiance assistance- CSW put application in the mail.  Pt also added to the [employer] Heart Failure waitlist for a copay grant  Will continue to follow and assist as needed  Jorge Ny, [facility] Desk#: [phone] Cell#: [phone]

## 2021-01-15 NOTE — Telephone Encounter (Signed)
I called Ms Gendron to discuss the medication changes from [facility] appointment yesterday. She advised she was aware of the changes but had not gotten the jardiance yet. I have concerns about he adjusting his torsemide by herself so I had asked they she wait to make these changes until Joellen Jersey comes next week. She was understanding and agreeable. I will speak with Joellen Jersey next week and make sure she is aware.   [name], EMT 01/15/21

## 2021-01-16 DIAGNOSIS — J9601 Acute respiratory failure with hypoxia: Secondary | ICD-10-CM | POA: Diagnosis not present

## 2021-01-16 DIAGNOSIS — J449 Chronic obstructive pulmonary disease, unspecified: Secondary | ICD-10-CM | POA: Diagnosis not present

## 2021-01-16 DIAGNOSIS — J9602 Acute respiratory failure with hypercapnia: Secondary | ICD-10-CM | POA: Diagnosis not present

## 2021-01-16 DIAGNOSIS — I5022 Chronic systolic (congestive) heart failure: Secondary | ICD-10-CM | POA: Diagnosis not present

## 2021-01-16 NOTE — Progress Notes (Signed)
Date:  01/16/2021   ID:  Andrew [name], DOB 10/19/1928, MRN [id number]  Provider location: [place] [facility] Type of Visit: Established patient   PCP:  [name], MD  Cardiologist:  Dr. [name]   History of Present Illness: Andrew [name] is a 87 y.o. male who has a history of CKD, CAD s/p CABG, and ischemic cardiomyopathy with primarily diastolic CHF.  He has PAD followed at [facility].  I took him for [facility] in 11/15. 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  He had had occlusion of a relatively small LAD, there were right to left collaterals and no intervention was done (medical management).    He and his wife went on a cruise along the [employer] in 5/16.  He admits to considerable dietary indiscretion (high sodium diet). It appears that he developed a hypertensive crisis along with chest pain while on the ship. He went into atrial fibrillation and developed CHF.  He was taken to the hospital in [place], [place].  He was in the ICU for about a week on Bipap intermittently.  Troponin peaked at 0.09 during this admission.  He was diuresed and after about 2 wks left the hospital and was able to fly home.    Cardiolite (6/16) showed EF 42%, prior anterolateral MI, no ischemia. Echo in 10/16 with EF 40-45%.    He was admitted on 07/06/15 for RLQ pain and fever.  He was treated for UTI and had a kidney stone as well.  He developed SOB after IVF were given for AKI.  He was treated for acute COPD exacerbation as well as CHF decompensation.  He was diuresed with IV lasix.  His weight on admission was 179 and got as high as 188 after IVF. Discharge weight was 181.  He was admitted in 10/16 with a cerebellar CVA.  He has some resultant imbalance.   He was again admitted in 11/16 with acute on chronic systolic CHF.  This was a short admission that appeared to be precipitated by a sodium load from eating at a [place] [employer] .   3/18 Echo showed stable EF 40-45%.  He  was also having chest pain so I did a Cardiolite in 3/18, no ischemia.   He was admitted in 11/18 for elective hernia repair. Post-op, he developed hypoxemia and had a rehab stay.  He is now off oxygen.   Creatinine increased to 2.97 in 1/19.  I cut back on losartan and Lasix.  He developed increased lower extremity edema and we increased Lasix back to 120 qam/80 qpm.  He had a mechanical fall in 7/20 (tripped). He hit his head but refused to go to the ER.  Later in 7/20, he was admitted with fever and started on antibiotics with concern for PNA, but CT chest did not show definite PNA.  ?Viral syndrome.  COVID-19 was negative.   Echo in 8/20 showed  EF 45-50% with anterolateral hypokinesis, normal RV.   He was admitted in 6/21 with aspiration PNA and COPD exacerbation, sent to [facility] for rehab afterwards.   Patient developed a left heel ulcer. In 8/21, he had peripheral angiogram by Dr. Donzetta Matters showing multiple left SFA stenoses > 50% but no occlusion. The right SFA was occluded with recanalization.  No interventional option.   Echo in 9/21 showed EF 40-45%, anterolateral hypokinesis, normal RV, mild mitral stenosis with mean gradient 7 mmHg, IVC dilated.   He returns for  followup of CHF and CAD. Weight is down 2 lbs.  He is no longer going to the wound clinic (leg ulcer healed). No dyspnea walking around his house.  No claudication.  No chest pain.  Legs are wrapped.  No orthopnea/PND.  Overall feels pretty good.    ECG (personally reviewed): NSR, voltage Ps, poor RWP  Labs (12/14): K 4.8, creatinine 1.5 Labs (5/15): LDL 92 Labs (11/15): K 4.1, creatinine 1.5 Labs (12/15): K 4, creatinine 1.5 Labs (1/16): LFTs normal, LDL 51, HDL 47 Labs (5/16): TnI 0.09 Labs (6/16): K 4.8 => 4.4, creatinine 1.79 => 2.26, HCT 28.7 Labs (7/16) K 4.2, creatinine 1.98, LFTs normal, TSH normal Labs (8/16): HCT 27.1 Labs (9/16): hgb 8.1, K 4.3, creatinine 1.8, LFTs normal, TSH elevated Labs (10/16):  LDL 74, HDL 45, TSH 5.19 (increased), free T3 and free T4 normal.  Labs (11/16): K 3.5, creatinine 1.47, AST 84, ALT 89 Labs (12/16): K 4.3, creatinine 1.58, AST 43, ALT 42 Labs (3/17): K 4.2, creatinine 1.4, hgb 10.7, AST 36, ALT 45 Labs (6/17): K 4, creatinine 1.69, hgb 10.9, proBNP 389, TSH mildly elevated Labs (8/17): Free T4 and T3 normal, TSH mildly elevated, K 4.2, creatinine 1.7, LFTs normal, hgb 10.4 Labs (12/17): TSH elevated but free T3 and T4 normal, K 4.5, creatinine 1.9, LDL 45, HDL 39, LFTs normal, HCT 32.9 Labs (1/18): K 4.4, creatinine 1.93 Labs (3/18): K 4.3, creatinine 1.87, BNP 187, hgb 11.1, LDL 75, LFTs normal Labs (4/18): K 4.1, creatinine 1.7 Labs (9/18): K 4, creatinine 1.8, hgb 11.9 Labs (10/18): free T4 0.4, free T3 1.9, TSH 139  Labs (12/18): K 3.4, creatinine 1.76, hgb 12.2 Labs (1/19): K 4 => 4.9, creatinine 2.7 => 2.97, TSH 179, LFTs normal Labs (2/19): K 4.6, creatinine 2.19 Labs (4/19): LDL 61, HDL 36, K 3.9, creatinine 1.77, LFTs normal  Labs (6/19): hgb 11.7, K 3.8, creatinine 2.09, TSH 10 Labs (9/19): K 3.9, creatinine 1.69 Labs (12/19): K 3.5, creatinine 1.69, LDL 75, HDL 39, TSH normal Labs (7/20): K 3.8, creatinine 1.69 Labs (8/20): K 4, creatinine 1.63, hgb 12.1 Labs (12/20): K 3.9, creatinine 2.05, AST 44, ALT 58, hgb 12.9, TSH normal Labs (2/21): K 4, creatinine 1.99, hgb 12.5, plts 133, LDL 58, HDL 34, AST 72, ALT 87 Labs (8/21): K 3.9, creatinine 1.76 Labs (9/21): K 3.9, creatinine 2.08, LFTs normal, LDL 42 Labs (10/21): K 3.8, creatinine 2.4 (GFR 25)  PMH: 1. CKD stage 3 2. HTN 3. Hyperlipidemia 4. AAA: s/p surgery with left renal artery bypass in 1997 - Abdominal US 10/18 with 3.2 AAA 5. GERD 6. IBS 7. Melanoma s/p resection 8. Diabetes: Diet-controlled 9. Carotid stenosis: Carotid dopplers (4/15) with 40-59% bilateral stenosis. Carotid dopplers (7/16) with < 40% BICA stenosis.  Carotid dopplers (10/16) with 40-59% BICA stenosis.  -  Carotids (8/17) with minimal disease.  - Carotids (10/18) with 1-39% BICA stenosis.  - Carotids (1/19): 1-39% BICA stenosis 10. PAD: 9/14 ABIs 0.91 right 1.09 left.  7/16 ABIs 0.86 right, 1.1 left; aortoiliac duplex with < 50% bilateral iliac stenosis.  - Aortoiliac duplex (8/17) with < 50% bilateral iliac stenosis.   - ABIs (10/17): left 0.91, right 0.98, left TBI 0.44 (abnormal).  - ABIs (7/18): right 0.97, right 0.75 - ABIs (1/19): right 1.04, left 1.03 - ABIs (7/19): right 1.13, noncompressible left ABI but TBI 0.93 - Peripheral angiogram (8/21): Multiple left SFA stenoses > 50% but no occlusion. The right SFA was occluded with recanalization.  No interventional option.  11. CAD: Has super-dominant RCA. s/p CABG 1992.  He had Taxus DES to mid PDA in 5/02. LHC (1/05) with 90% ostial PDA (had DES to this vessel), totally occluded RIMA-PDA, totally occluded PLV, sequential SVG-PLV with 1 branch occluded.  LHC (11/15) with left main off the right cusp, 40-50% ostial left main, total occlusion of the proximal LAD (small vessel) with right to left collaterals, large super-dominant RCA with 50% ISR mid RCA, 50% ostial PDA, patent SVG-PLV, RIMA-PDA known atretic. Lexiscan Cardiolite (6/16) with EF 42%, prior anterolateral MI, no ischemia.  - Lexiscan Cardiolite (3/18): EF 51%, basal to mid anterolateral fixed defect with no ischemia.  12. Ischemic cardiomyopathy: Echo (12/09) with EF 45-50%, basal to mid inferolateral hypokinesis. Echo (6/15) with EF 45-50%, akinesis of the mid to apical inferolateral wall.  Echo (6/16) with EF 50-55%, mild LVH, inferior hypokinesis, mild MR.  Echo (10/16) with EF 40-45%, moderate LVH, mildly decreased RV systolic function.  - Echo (3/18) with EF 40-45%, mid anterior hypokinesis.  - Echo (2/19) with EF 60-65%, mild LVH, trade II diastolic dysfunction.  - Echo (8/20): EF 45-50%, anterolateral hypokinesis, normal RV size and systolic function.  - Echo (9/21): EF 40-45%,  anterolateral hypokinesis, normal RV, mild mitral stenosis with mean gradient 7 mmHg, IVC dilated.  13. OA 14. Atrial fibrillation/flutter: Paroxysmal.  Noted during 5/16 hospitalization in [place] and in 6/16.  He developed atrial flutter in 8/16, back in NSR by 9/16.  15. Emphysema: PFTs (8/16) with FVC 88%, FEV1 74%, ratio 81%, TLC 83%, DLCO 31%. - PFTs (12/18): FVC 64%, FEV1 69%, ratio 105%, TLC 82%, DLCO 33% => mild obstruction, severely decreased DLCO (similar to prior).   16. Renal cysts 17. Idiopathic peripheral neuropathy.  18. Anemia.  90. 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  20. Hernia repair (11/18) 21. Hypothyroidism 22. Bronchiectasis: CT chest 1/19 with bronchiectasis.  - Aspiration PNA and COPD exacerbation in 6/21.  23. Lung nodule: 1.3 cm RML nodule on 1/19 CT, was noted in 2014 => slow growing. Possibly malignant.  Decision made with pulmonary to watch and avoid biopsy.  - CT chest (7/20) with stable RML nodule, no evidence for amiodarone toxicity.    Current Outpatient Medications  Medication Sig Dispense Refill  . acetaminophen (TYLENOL) 500 MG tablet Take 2 tablets (1,000 mg total) by mouth every 8 (eight) hours as needed. 30 tablet 0  . amiodarone (PACERONE) 200 MG tablet Take 0.5 tablets (100 mg total) by mouth daily. 45 tablet 3  . atorvastatin (LIPITOR) 80 MG tablet Take 1 tablet (80 mg total) by mouth daily. 30 tablet 3  . carvedilol (COREG) 12.5 MG tablet Take 12.5 mg by mouth 2 (two) times daily with a meal.    . cilostazol (PLETAL) 100 MG tablet Take 1 tablet (100 mg total) by mouth 2 (two) times daily. 180 tablet 3  . ELIQUIS 2.5 MG TABS tablet TAKE 1 TABLET BY MOUTH TWICE DAILY. 180 tablet 1  . empagliflozin (JARDIANCE) 10 MG TABS tablet Take 1 tablet (10 mg total) by mouth daily before breakfast. 90 tablet 3  . ezetimibe (ZETIA) 10 MG tablet TAKE (1) TABLET DAILY. 90 tablet 0  . FLUoxetine (PROZAC) 20 MG capsule TAKE (1) CAPSULE DAILY. 90 capsule 0  . gabapentin  (NEURONTIN) 100 MG capsule Take 100 mg by mouth 3 (three) times daily.    Marland Kitchen glucose blood (ACCU-CHEK AVIVA PLUS) test strip CHECK BLOOD SUGAR 4 TIMES A DAY. 100 each 11  .  hydrALAZINE (APRESOLINE) 50 MG tablet Take 1 tablet (50 mg total) by mouth 3 (three) times daily. 90 tablet 3  . HYDROcodone-acetaminophen (NORCO/VICODIN) 5-325 MG tablet TAKE 1 TABLET EVERY 6 HOURS AS NEEDED FOR MODERATE PAIN. 12 tablet 0  . isosorbide mononitrate (IMDUR) 30 MG 24 hr tablet Take 1 tablet (30 mg total) by mouth daily. 30 tablet 3  . levothyroxine (SYNTHROID) 100 MCG tablet TAKE 1 TABLET BEFORE BREAKFAST. 90 tablet 0  . metolazone (ZAROXOLYN) 2.5 MG tablet Take 1 tablet (2.5 mg total) by mouth as directed. By HF Clinic 3 tablet 0  . Multiple Vitamins-Minerals (MULTIVITAMIN ADULTS 50+) TABS Take 1 tablet by mouth daily.    . nitroGLYCERIN (NITROSTAT) 0.4 MG SL tablet DISSOLVE 1 TABLET UNDER TONGUE AS NEEDED FOR CHEST PAIN,MAY REPEAT IN5 MINUTES FOR 2 DOSES. 25 tablet 3  . pantoprazole (PROTONIX) 40 MG tablet Take 1 tablet (40 mg total) by mouth 2 (two) times daily.    . potassium chloride SA (KLOR-CON) 20 MEQ tablet Take 20 mEq by mouth daily.    Marland Kitchen tiotropium (SPIRIVA) 18 MCG inhalation capsule Place 18 mcg into inhaler and inhale daily as needed (for wheezing).    . torsemide (DEMADEX) 20 MG tablet Take 4 tablets (80 mg total) by mouth every morning AND 2 tablets (40 mg total) every evening. 180 tablet 11   No current facility-administered medications for this encounter.    Allergies:   Metoprolol, Metformin, and Metformin and related   Social History:  The patient  reports that he has never smoked. He has never used smokeless tobacco. He reports that he does not drink alcohol and does not use drugs.   Family History:  The patient's family history includes Colon cancer in his father; Coronary artery disease in his brother; Diabetes in his brother and son; Heart disease in his brother; Hyperlipidemia in his  brother and son; Peripheral vascular disease in his brother; Throat cancer in his brother.   ROS:  Please see the history of present illness.   All other systems are personally reviewed and negative.   Exam:   BP 120/60   Pulse (!) 52   Wt 79.1 kg (174 lb 6.4 oz)   SpO2 93%   BMI 27.31 kg/m  General: NAD Neck: No JVD, no thyromegaly or thyroid nodule.  Lungs: Clear to auscultation bilaterally with normal respiratory effort. CV: Nondisplaced PMI.  Heart regular S1/S2, no S3/S4, no murmur.  No peripheral edema, lower legs wrapped.  No carotid bruit.  Normal pedal pulses.  Abdomen: Soft, nontender, no hepatosplenomegaly, no distention.  Skin: Intact without lesions or rashes.  Neurologic: Alert and oriented x 3.  Psych: Normal affect. Extremities: No clubbing or cyanosis.  HEENT: Normal.   Recent Labs: 06/03/2020: B Natriuretic Peptide 1,981.3 06/11/2020: Magnesium 2.0 01/14/2021: ALT 66; BUN 63; Creatinine, Ser 2.07; Hemoglobin 10.1; Platelets 149; Potassium 3.5; Sodium 140; TSH 1.984  Personally reviewed   Wt Readings from Last 3 Encounters:  01/14/21 79.1 kg (174 lb 6.4 oz)  01/07/21 76.7 kg (169 lb)  12/24/20 74.8 kg (164 lb 12.8 oz)      ASSESSMENT AND PLAN:  1. CAD: s/p CABG.  Cath in 11/15 showed occluded LAD with left to right collaterals.  The LAD was a relatively small vessel (super-dominant right).  Managed medically. Lexiscan Cardiolite in 6/16 with infarction but no ischemia.  Cardiolite 3/18 with infarction, no ischemia. No chest pain.  - Continue atorvastatin, he is not on ASA given use of  Eliquis.  2. Chronic heart failure with mid range EF/ischemic cardiomyopathy: EF 40-45% on 3/18 echo.  Echo 8/20 with EF 45-50%, normal RV. Echo in 9/21 with EF 40-45%, mild mitral stenosis.  NYHA II. Weight is down and volume well-controlled on torsemide.   - Decrease torsemide to 80 qam/40 qpm and add Jardiance 10 mg daily.  BMET today and in 10 days.   - Continue hydralazine 50  mg tid.    - Continue Imdur 30 mg daily.    - Continue Coreg 12.5 mg bid.  - Not on ACEi/ARB/spironolactone with elevated creatinine.    3. Carotid stenosis: Stable. Due for repeat carotid dopplers, needs to make followup at [facility].   4. PAD: Severe bilateral SFA disease on 8/21 peripheral angiogram, no revascularization options.  Leg ulcers now healed, no longer going to wound clinic.  Minimal claudication.  - Continue cilostazol (severe claudication if he stops it, though not ideal with CHF).  5. Hyperlipidemia: Continue atorvastatin, good lipids in 9/21.  6. CKD III:  BMET today.  7. Atrial fibrillation/flutter:  Paroxysmal.  NSR today.  - Continue amiodarone 100 mg daily. Will need to get regular eye exams with amiodarone use.  CT chest in 7/20 did not appear to show evidence for amiodarone lung toxicity.  Check LFTs and TSH.  He will need regular eye exam.     - Continue Eliquis 2.5 bid.  8. Suspected OSA:  Has declined sleep study.   9. COPD: He is now off oxygen. He has bronchiectasis thought to be due to chronic aspiration.  Hospitalization in 6/21 with PNA and COPD exacerbation.  - Follows with pulmonary.  10. CVA: Cerebellar, 10/16.  He has had resulting imbalance.  - He is no longer driving.  11. Hypertension: BP controlled.  12. Hypothyroidism: Likely related to amiodarone. - Followed by PCP.  13. Pulmonary nodule: 1.3 cm RML nodule on 1/19 CT chest.  It was also seen in 2014 => slow growing.  Dr Lake Bells has discussed with patient and wife and they have decided not to go forward with biopsy. They would not be interested in lung cancer treatment.  CT chest in 7/20 showed stable 1 cm RML nodule.   Followup in 3 months.   Signed, Loralie Champagne, MD  01/16/2021  [place] [address] [facility] [place] [address] [phone] (office) [phone] (fax)

## 2021-01-18 DIAGNOSIS — I872 Venous insufficiency (chronic) (peripheral): Secondary | ICD-10-CM | POA: Diagnosis not present

## 2021-01-18 DIAGNOSIS — E1151 Type 2 diabetes mellitus with diabetic peripheral angiopathy without gangrene: Secondary | ICD-10-CM | POA: Diagnosis not present

## 2021-01-18 DIAGNOSIS — I5022 Chronic systolic (congestive) heart failure: Secondary | ICD-10-CM | POA: Diagnosis not present

## 2021-01-18 DIAGNOSIS — L89620 Pressure ulcer of left heel, unstageable: Secondary | ICD-10-CM | POA: Diagnosis not present

## 2021-01-18 DIAGNOSIS — I130 Hypertensive heart and chronic kidney disease with heart failure and stage 1 through stage 4 chronic kidney disease, or unspecified chronic kidney disease: Secondary | ICD-10-CM | POA: Diagnosis not present

## 2021-01-18 DIAGNOSIS — N184 Chronic kidney disease, stage 4 (severe): Secondary | ICD-10-CM | POA: Diagnosis not present

## 2021-01-18 DIAGNOSIS — D631 Anemia in chronic kidney disease: Secondary | ICD-10-CM | POA: Diagnosis not present

## 2021-01-18 DIAGNOSIS — E1122 Type 2 diabetes mellitus with diabetic chronic kidney disease: Secondary | ICD-10-CM | POA: Diagnosis not present

## 2021-01-18 DIAGNOSIS — E1169 Type 2 diabetes mellitus with other specified complication: Secondary | ICD-10-CM | POA: Diagnosis not present

## 2021-01-21 ENCOUNTER — Other Ambulatory Visit (HOSPITAL_COMMUNITY): Payer: Self-pay

## 2021-01-21 NOTE — Progress Notes (Signed)
Paramedicine Encounter    Patient ID: Andrew [name], male    DOB: 1928/05/25, 87 y.o.   MRN: [id number]   Patient Care Team: [name], MD as PCP - General (Family Medicine) Larey Dresser, MD as PCP - Advanced Heart Failure (Cardiology) Larey Dresser, MD as Consulting Physician (Cardiology) Jorge Ny, LCSW as Social Worker (Licensed Clinical Social Worker) Gardiner Barefoot, DPM as Consulting Physician (Podiatry) [name], MD (Family Medicine) Patient, No Pcp Per (General Practice)  Patient Active Problem List   Diagnosis Date Noted  . Acute respiratory failure with hypercapnia ([place]) 06/02/2020  . Acute metabolic encephalopathy 30/08/2329  . CKD (chronic kidney disease), stage IV ([place]) 06/02/2020  . PAF (paroxysmal atrial fibrillation) ([place]) 06/02/2020  . Hypothyroidism 06/02/2020  . Chronic anticoagulation 06/02/2020  . Debility 06/01/2020  . Lumbar spondylosis 10/04/2018  . Chronic respiratory failure with hypoxia ([place]) 09/16/2018  . Pain of right thumb 06/19/2018  . Bilateral recurrent inguinal hernias 11/28/2017  . Carotid artery stenosis 01/12/2016  . Chronic systolic CHF (congestive heart failure) ([place]) 12/02/2015  . 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 ([place]) 10/16/2015  . PVD (peripheral vascular disease) ([place])   . Hereditary and idiopathic peripheral neuropathy 10/02/2015  . Peripelvic (lymphatic) cyst   . Pernicious anemia 07/15/2015  . Bilateral renal cysts 07/12/2015  . Lung nodule, 76mm RML CT 07/12/15 07/12/2015  . Stage 3 chronic kidney disease ([place]) 06/14/2015  . Paroxysmal atrial fibrillation ([place]) 05/28/2015  . AAA (abdominal aortic aneurysm) without rupture ([place]) 09/30/2014  . Cardiomyopathy, ischemic 05/23/2014  . DM (diabetes mellitus), type 2 with renal complications ([place]) 07/62/2633  . Depression, major, single episode, complete remission ([place]) 03/18/2010  . Hypothyroidism 02/03/2009  . Osteoarthritis 05/27/2008  .  Hyperlipidemia associated with type 2 diabetes mellitus ([place]) 06/14/2007  . Hypertension associated with diabetes ([place]) 06/14/2007  . GERD 06/14/2007    Current Outpatient Medications:  .  acetaminophen (TYLENOL) 500 MG tablet, Take 2 tablets (1,000 mg total) by mouth every 8 (eight) hours as needed., Disp: 30 tablet, Rfl: 0 .  amiodarone (PACERONE) 200 MG tablet, Take 0.5 tablets (100 mg total) by mouth daily., Disp: 45 tablet, Rfl: 3 .  atorvastatin (LIPITOR) 80 MG tablet, Take 1 tablet (80 mg total) by mouth daily. (Patient taking differently: Take 80 mg by mouth every evening.), Disp: 30 tablet, Rfl: 3 .  carvedilol (COREG) 12.5 MG tablet, Take 12.5 mg by mouth 2 (two) times daily with a meal., Disp: , Rfl:  .  cilostazol (PLETAL) 100 MG tablet, Take 1 tablet (100 mg total) by mouth 2 (two) times daily., Disp: 180 tablet, Rfl: 3 .  ELIQUIS 2.5 MG TABS tablet, TAKE 1 TABLET BY MOUTH TWICE DAILY., Disp: 180 tablet, Rfl: 1 .  empagliflozin (JARDIANCE) 10 MG TABS tablet, Take 1 tablet (10 mg total) by mouth daily before breakfast., Disp: 90 tablet, Rfl: 3 .  ezetimibe (ZETIA) 10 MG tablet, TAKE (1) TABLET DAILY., Disp: 90 tablet, Rfl: 0 .  FLUoxetine (PROZAC) 20 MG capsule, TAKE (1) CAPSULE DAILY., Disp: 90 capsule, Rfl: 0 .  gabapentin (NEURONTIN) 100 MG capsule, Take 100 mg by mouth 3 (three) times daily., Disp: , Rfl:  .  glucose blood (ACCU-CHEK AVIVA PLUS) test strip, CHECK BLOOD SUGAR 4 TIMES A DAY., Disp: 100 each, Rfl: 11 .  hydrALAZINE (APRESOLINE) 50 MG tablet, Take 1 tablet (50 mg total) by mouth 3 (three) times daily., Disp: 90 tablet, Rfl: 3 .  isosorbide  mononitrate (IMDUR) 30 MG 24 hr tablet, Take 1 tablet (30 mg total) by mouth daily., Disp: 30 tablet, Rfl: 3 .  levothyroxine (SYNTHROID) 100 MCG tablet, TAKE 1 TABLET BEFORE BREAKFAST., Disp: 90 tablet, Rfl: 0 .  Multiple Vitamins-Minerals (MULTIVITAMIN ADULTS 50+) TABS, Take 1 tablet by mouth daily., Disp: , Rfl:  .  pantoprazole  (PROTONIX) 40 MG tablet, Take 1 tablet (40 mg total) by mouth 2 (two) times daily. (Patient taking differently: Take 40 mg by mouth 2 (two) times daily.), Disp: , Rfl:  .  potassium chloride SA (KLOR-CON) 20 MEQ tablet, Take 20 mEq by mouth daily., Disp: , Rfl:  .  tiotropium (SPIRIVA) 18 MCG inhalation capsule, Place 18 mcg into inhaler and inhale daily as needed (for wheezing)., Disp: , Rfl:  .  torsemide (DEMADEX) 20 MG tablet, Take 4 tablets (80 mg total) by mouth every morning AND 2 tablets (40 mg total) every evening., Disp: 180 tablet, Rfl: 11 .  HYDROcodone-acetaminophen (NORCO/VICODIN) 5-325 MG tablet, TAKE 1 TABLET EVERY 6 HOURS AS NEEDED FOR MODERATE PAIN. (Patient not taking: Reported on 01/21/2021), Disp: 12 tablet, Rfl: 0 .  metolazone (ZAROXOLYN) 2.5 MG tablet, Take 1 tablet (2.5 mg total) by mouth as directed. By [facility] Clinic (Patient not taking: Reported on 01/21/2021), Disp: 3 tablet, Rfl: 0 .  nitroGLYCERIN (NITROSTAT) 0.4 MG SL tablet, DISSOLVE 1 TABLET UNDER TONGUE AS NEEDED FOR CHEST PAIN,MAY REPEAT IN5 MINUTES FOR 2 DOSES. (Patient not taking: Reported on 01/21/2021), Disp: 25 tablet, Rfl: 3 Allergies  Allergen Reactions  . Metoprolol Shortness Of Breath, Palpitations and Other (See Comments)    Heart starts racing. Shallow breathing; pt states he also get skin irritation on his legs  . Metformin Hives and Swelling    On legs  . Metformin And Related Nausea And Vomiting      Social History   Socioeconomic History  . Marital status: Married    Spouse name: Not on file  . Number of children: Not on file  . Years of education: Not on file  . Highest education level: Not on file  Occupational History  . Occupation: Retired  Tobacco Use  . Smoking status: Never Smoker  . Smokeless tobacco: Never Used  . Tobacco comment: quit atleast 25 yrs ago, per pt  Vaping Use  . Vaping Use: Never used  Substance and Sexual Activity  . Alcohol use: Never    Comment: socially  .  Drug use: Never  . Sexual activity: Not Currently  Other Topics Concern  . Not on file  Social History Narrative   ** Merged History Encounter **       Pt lives in [place] with spouse. Retired from [employer].  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   Social Determinants of Health   Financial Resource Strain: Low Risk   . Difficulty of Paying Living Expenses: Not hard at all  Food Insecurity: No Food Insecurity  . Worried About Charity fundraiser in the Last Year: Never true  . Ran Out of Food in the Last Year: Never true  Transportation Needs: No Transportation Needs  . Lack of Transportation (Medical): No  . Lack of Transportation (Non-Medical): No  Physical Activity: Inactive  . Days of Exercise per Week: 0 days  . Minutes of Exercise per Session: 0 min  Stress: No Stress Concern Present  . Feeling of Stress : Not at all  Social Connections: Moderately Isolated  . Frequency of  Communication with Friends and Family: More than three times a week  . Frequency of Social Gatherings with Friends and Family: More than three times a week  . Attends Religious Services: Never  . Active Member of Clubs or Organizations: No  . Attends Archivist Meetings: Never  . Marital Status: Married  Human resources officer Violence: Not At Risk  . Fear of Current or Ex-Partner: No  . Emotionally Abused: No  . Physically Abused: No  . Sexually Abused: No    Physical Exam      Future Appointments  Date Time Provider [place]  01/27/2021  1:45 PM [facility] None  01/29/2021  1:15 PM Ricard Dillon, MD [facility]  04/19/2021  2:00 PM Larey Dresser, MD [facility] None    BP (!) 116/52   Pulse 78   Resp 18   SpO2 94%   Weight yesterday-? Last visit weight-169 @ clinic   Pt reports doing well. Playing the piano a lot today. Seems to be in very good spirits.  He is just starting the jardiance today. Labs on  Monday was cancelled and changed to Labs on 2/2. This was relayed to wife.  She plans on taking him. He has no complaints today. Denies increased sob, no dizziness, no c/p. He did better with taking his meds the past 2 wks. Probably b/c his wife is doing better and reminding him of it.  He needs these meds for refills-she will p/u unless she calls pharmacy and tells them otherwise.  --gabapentin, isosorbide  meds verified and 2 pill boxes filled.   Marylouise Stacks,  [facility] Paramedic  01/21/21

## 2021-01-22 DIAGNOSIS — E1151 Type 2 diabetes mellitus with diabetic peripheral angiopathy without gangrene: Secondary | ICD-10-CM | POA: Diagnosis not present

## 2021-01-22 DIAGNOSIS — E1122 Type 2 diabetes mellitus with diabetic chronic kidney disease: Secondary | ICD-10-CM | POA: Diagnosis not present

## 2021-01-22 DIAGNOSIS — J449 Chronic obstructive pulmonary disease, unspecified: Secondary | ICD-10-CM | POA: Diagnosis not present

## 2021-01-22 DIAGNOSIS — I872 Venous insufficiency (chronic) (peripheral): Secondary | ICD-10-CM | POA: Diagnosis not present

## 2021-01-22 DIAGNOSIS — I480 Paroxysmal atrial fibrillation: Secondary | ICD-10-CM | POA: Diagnosis not present

## 2021-01-22 DIAGNOSIS — I255 Ischemic cardiomyopathy: Secondary | ICD-10-CM | POA: Diagnosis not present

## 2021-01-22 DIAGNOSIS — I130 Hypertensive heart and chronic kidney disease with heart failure and stage 1 through stage 4 chronic kidney disease, or unspecified chronic kidney disease: Secondary | ICD-10-CM | POA: Diagnosis not present

## 2021-01-22 DIAGNOSIS — N184 Chronic kidney disease, stage 4 (severe): Secondary | ICD-10-CM | POA: Diagnosis not present

## 2021-01-22 DIAGNOSIS — S81801D Unspecified open wound, right lower leg, subsequent encounter: Secondary | ICD-10-CM | POA: Diagnosis not present

## 2021-01-25 ENCOUNTER — Other Ambulatory Visit (HOSPITAL_COMMUNITY): Payer: Medicare PPO

## 2021-01-27 ENCOUNTER — Ambulatory Visit (HOSPITAL_COMMUNITY)
Admission: RE | Admit: 2021-01-27 | Discharge: 2021-01-27 | Disposition: A | Discharge status: Home / Self Care | Payer: Medicare PPO | Source: Ambulatory Visit | Attending: Internal Medicine | Admitting: Internal Medicine

## 2021-01-27 ENCOUNTER — Other Ambulatory Visit: Payer: Self-pay

## 2021-01-27 DIAGNOSIS — E1122 Type 2 diabetes mellitus with diabetic chronic kidney disease: Secondary | ICD-10-CM | POA: Diagnosis not present

## 2021-01-27 DIAGNOSIS — N184 Chronic kidney disease, stage 4 (severe): Secondary | ICD-10-CM | POA: Diagnosis not present

## 2021-01-27 DIAGNOSIS — I480 Paroxysmal atrial fibrillation: Secondary | ICD-10-CM | POA: Diagnosis not present

## 2021-01-27 DIAGNOSIS — I872 Venous insufficiency (chronic) (peripheral): Secondary | ICD-10-CM | POA: Diagnosis not present

## 2021-01-27 DIAGNOSIS — J449 Chronic obstructive pulmonary disease, unspecified: Secondary | ICD-10-CM | POA: Diagnosis not present

## 2021-01-27 DIAGNOSIS — I130 Hypertensive heart and chronic kidney disease with heart failure and stage 1 through stage 4 chronic kidney disease, or unspecified chronic kidney disease: Secondary | ICD-10-CM | POA: Diagnosis not present

## 2021-01-27 DIAGNOSIS — I5022 Chronic systolic (congestive) heart failure: Secondary | ICD-10-CM | POA: Insufficient documentation

## 2021-01-27 DIAGNOSIS — E1151 Type 2 diabetes mellitus with diabetic peripheral angiopathy without gangrene: Secondary | ICD-10-CM | POA: Diagnosis not present

## 2021-01-27 DIAGNOSIS — S81801D Unspecified open wound, right lower leg, subsequent encounter: Secondary | ICD-10-CM | POA: Diagnosis not present

## 2021-01-27 DIAGNOSIS — I255 Ischemic cardiomyopathy: Secondary | ICD-10-CM | POA: Diagnosis not present

## 2021-01-27 LAB — BASIC METABOLIC PANEL
Anion gap: 12 (ref 5–15)
BUN: 54 mg/dL — ABNORMAL HIGH (ref 8–23)
CO2: 27 mmol/L (ref 22–32)
Calcium: 8.7 mg/dL — ABNORMAL LOW (ref 8.9–10.3)
Chloride: 101 mmol/L (ref 98–111)
Creatinine, Ser: 2.1 mg/dL — ABNORMAL HIGH (ref 0.61–1.24)
GFR, Estimated: 29 mL/min — ABNORMAL LOW (ref >60)
Glucose, Bld: 169 mg/dL — ABNORMAL HIGH (ref 70–99)
Potassium: 3.4 mmol/L — ABNORMAL LOW (ref 3.5–5.1)
Sodium: 140 mmol/L (ref 135–145)

## 2021-01-29 ENCOUNTER — Other Ambulatory Visit: Payer: Self-pay

## 2021-01-29 ENCOUNTER — Encounter (HOSPITAL_BASED_OUTPATIENT_CLINIC_OR_DEPARTMENT_OTHER): Payer: Medicare PPO | Attending: Internal Medicine | Admitting: Internal Medicine

## 2021-01-29 DIAGNOSIS — E11621 Type 2 diabetes mellitus with foot ulcer: Secondary | ICD-10-CM | POA: Diagnosis not present

## 2021-01-29 DIAGNOSIS — E1151 Type 2 diabetes mellitus with diabetic peripheral angiopathy without gangrene: Secondary | ICD-10-CM | POA: Diagnosis not present

## 2021-01-29 DIAGNOSIS — I714 Abdominal aortic aneurysm, without rupture: Secondary | ICD-10-CM | POA: Insufficient documentation

## 2021-01-29 DIAGNOSIS — Z86718 Personal history of other venous thrombosis and embolism: Secondary | ICD-10-CM | POA: Insufficient documentation

## 2021-01-29 DIAGNOSIS — Z89612 Acquired absence of left leg above knee: Secondary | ICD-10-CM | POA: Diagnosis not present

## 2021-01-29 DIAGNOSIS — Y838 Other surgical procedures as the cause of abnormal reaction of the patient, or of later complication, without mention of misadventure at the time of the procedure: Secondary | ICD-10-CM | POA: Diagnosis not present

## 2021-01-29 DIAGNOSIS — I2510 Atherosclerotic heart disease of native coronary artery without angina pectoris: Secondary | ICD-10-CM | POA: Diagnosis not present

## 2021-01-29 DIAGNOSIS — L97812 Non-pressure chronic ulcer of other part of right lower leg with fat layer exposed: Secondary | ICD-10-CM | POA: Diagnosis not present

## 2021-01-29 DIAGNOSIS — B351 Tinea unguium: Secondary | ICD-10-CM | POA: Insufficient documentation

## 2021-01-29 DIAGNOSIS — L97822 Non-pressure chronic ulcer of other part of left lower leg with fat layer exposed: Secondary | ICD-10-CM | POA: Diagnosis not present

## 2021-01-29 DIAGNOSIS — Z85820 Personal history of malignant melanoma of skin: Secondary | ICD-10-CM | POA: Diagnosis not present

## 2021-01-29 DIAGNOSIS — L97829 Non-pressure chronic ulcer of other part of left lower leg with unspecified severity: Secondary | ICD-10-CM | POA: Insufficient documentation

## 2021-01-29 DIAGNOSIS — I872 Venous insufficiency (chronic) (peripheral): Secondary | ICD-10-CM | POA: Diagnosis not present

## 2021-01-29 DIAGNOSIS — L97811 Non-pressure chronic ulcer of other part of right lower leg limited to breakdown of skin: Secondary | ICD-10-CM | POA: Insufficient documentation

## 2021-01-29 DIAGNOSIS — Z7901 Long term (current) use of anticoagulants: Secondary | ICD-10-CM | POA: Diagnosis not present

## 2021-01-29 DIAGNOSIS — I110 Hypertensive heart disease with heart failure: Secondary | ICD-10-CM | POA: Diagnosis not present

## 2021-01-29 DIAGNOSIS — T8131XA Disruption of external operation (surgical) wound, not elsewhere classified, initial encounter: Secondary | ICD-10-CM | POA: Insufficient documentation

## 2021-01-29 DIAGNOSIS — I480 Paroxysmal atrial fibrillation: Secondary | ICD-10-CM | POA: Diagnosis not present

## 2021-01-29 DIAGNOSIS — I87333 Chronic venous hypertension (idiopathic) with ulcer and inflammation of bilateral lower extremity: Secondary | ICD-10-CM | POA: Insufficient documentation

## 2021-01-29 DIAGNOSIS — L89620 Pressure ulcer of left heel, unstageable: Secondary | ICD-10-CM | POA: Diagnosis not present

## 2021-01-29 DIAGNOSIS — E11622 Type 2 diabetes mellitus with other skin ulcer: Secondary | ICD-10-CM | POA: Diagnosis not present

## 2021-01-29 NOTE — Progress Notes (Signed)
SALOME, COZBY ([id number]) Visit Report for 01/29/2021 HPI Details Patient Name: Date of Service: [name], Andrew RGE A. 01/29/2021 1:15 PM Medical Record Number: [id number] Patient Account Number: [id number] Date of Birth/Sex: Treating RN: 05/12/1928 (87 y.o. Andrew [name] Primary Care Provider: Dimas [name] Other Clinician: Referring Provider: Treating Provider/Extender: [name] in Treatment: 20 History of Present Illness HPI Description: 09/11/2020 ADMISSION This is a soon-to-be 87 year old man who was hospitalized in June and subsequently spent time at [name]. At some point he developed a pressure ulcer on his left heel. On discharge he was seen by podiatry and referred to [name] with regards to the possibility of PAD. The patient underwent angiography on 08/10/2020 this showed heavily calcified vessels. The common iliac artery on the left appeared to have calcific disease but there was no gradient. Left common external leg arteries were heavily tortuous left lower extremity limited angiography developed demonstrated a SFA to be helped heavily calcified throughout with multiple greater than 50% stenosis but never frankly occludes he was not felt to be a candidate for endovascular repair. I think they had a minimal look at the right side and it appeared that the SFA was occluded and reconstitutes above the knee. The overall feeling was that if he could not heal his left heel ulcer he will eventually best be served by a left above-knee amputation The last I can see is that he should have been using a Santyl wet-to-dry dressing to the heel. During his stay with podiatry was also noted that he had venous insufficiency and I think venous insufficiency wounds. His wife tells me that he has been recommended for compression stockings in the past but the patient refused, he has wounds on his bilateral anterior lower legs right greater than the left.  Chronic venous inflammation and dry flaking skin. Past medical history; heart failure, right heel wound, paroxysmal atrial fibrillation on Eliquis, abdominal aortic aneurysm with previous repair, COPD, DVT, melanoma on his back, diabetes with a recent hemoglobin A1c of 7.3 however he is not on any treatment [type II], PAD, coronary artery disease, hypertension The patient's most recent arterial studies were on 05/28/2020. This showed an ABI on the right at 1.12 but monophasic waveforms and absent waveforms at the posterior tibial artery. On the left his ABI was 1.47 [noncompressible] he had triphasic waveforms at the ATA and monophasic at the PTA. His TBI on the right was 1.12 on the left and 0.97. It was felt his ABIs were falsely elevated 9/24. The patient followed up with [name] and had noninvasive arterial studies. On the right his ABIs were noncompressible monophasic waveforms with a TBI of 1.45 and a right great toe pressure of 172. On the left he had monophasic waveforms with noncompressible ABIs TBI of 0.79 and a left great toe pressure of 94. He will be seen in follow-up by Andrew [name] in a month's time. The patient also has very significant chronic venous insufficiency 10/8; patient with known PAD. He came in with bilateral lower extremity wounds likely secondary to venous stasis. Still has one on the upper right tibial area. The area on the left leg is healed however he still has an area on the left heel. This needed an extensive debridement today. His wife brings in notes apparently [name] ordered an MRI of the left heel. I was unaware of this. I was not aware that he did not been seen by Andrew [name] and I do  not have any records of this. Apparently MRI called to set up the appointment. I will need to look through epic on this 10/15; this is a patient with known PAD who was revascularized. He also has venous stasis disease. The area that was on the right anterior tibia has actually close  down although he has a still a small area on the upper anterior tibia. 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 We have been using Iodoflex on this area and he is required debridements. We tried to get her to agree to an MRI that have been ordered by [name]. However she did not get back to the MRI people or they have not called her back. We will reorder this. He is quite tender around this wound and I wonder about osteomyelitis. 10/29; MRI. Fortunately this did not show any evidence of abscess or osteomyelitis in the left heel. Vein and vascular on 10/21; this is a patient who is previously had an angiogram. He had a heavily calcified aorta and iliac vessels as well as the SFA. He had multiple areas of diffuse 50% stenosis. No interventions were apparently done by Andrew [name] he does have an occluded right SFA but with above-knee reconstitution. He was not felt to be a candidate for revascularization for his diffuse areas of greater than 50% stenosis. It was not felt that these were amenable to intervention Since he was last here he went to see dermatology. I believe he had skin cancer extractions from the right area anteriorly and 2 on the left medial. 11/12; left heel actually looks somewhat better but still requiring debridement. We've been using Iodoflex Right anterior mid tibia surgical extraction for a skin cancer. This also required debridement The area on the left lower medial leg looks a lot better and is just about fully epithelialized 12/3; pressure ulcer on the left heel in the setting of PAD. This looks better still requiring debridement we have been using Iodoflex Right anterior mid tibia surgical extraction for cancer this also continually requires debridement although the wound measures smaller The area on  the left lower medial leg had much more eschar on this than last time this is a deterioration. Notable for the fact that home health only wrap the leg halfway and actually had one of the wraps at the level of the wound on the right anterior. 12/17; the area on the left heel is just about closed. The area on the right anterior tibia looks much healthier and is smaller. The area on the left medial ankle I think is also smaller. We have been using Iodoflex to all areas under 3 layer compression bilaterally. He has home health 1/7; left heel is healed. He has an area on the right anterior tibia and the left medial lower leg. Significant chronic venous insufficiency he has home health 1/21; left heel remains healed which is his original wound. He is yet area on the right anterior tibia which is smaller and an area on the left medial lower leg just above the medial malleolus this also looks smaller. He has significant chronic venous insufficiency 2/4; left heel remains closed. He has an area on the right anterior and a left medial lower leg this is in the setting of chronic venous insufficiency. We have been using Hydrofera Blue  and 3 layer compression bilateral Electronic Signature(s) Signed: 01/29/2021 4:22:00 PM By: Linton Ham MD Entered By: Linton Ham on 01/29/2021 14:09:25 -------------------------------------------------------------------------------- Chemical Cauterization Details Patient Name: Date of Service: [name], Andrew RGE A. 01/29/2021 1:15 PM Medical Record Number: [id number] Patient Account Number: [id number] Date of Birth/Sex: Treating RN: [birth year] (87 y.o. Andrew [name] Primary Care Provider: Dimas [name] Other Clinician: Referring Provider: Treating Provider/Extender: [name] in Treatment: 20 Procedure Performed for: Wound #4 Right,Proximal,Anterior Lower Leg Performed By: Physician Ricard Dillon., MD Post Procedure Diagnosis Same as  Pre-procedure Notes using silver nitrate stick Electronic Signature(s) Signed: 01/29/2021 4:22:00 PM By: Linton Ham MD Entered By: Linton Ham on 01/29/2021 14:08:25 -------------------------------------------------------------------------------- Chemical Cauterization Details Patient Name: Date of Service: [name], Andrew RGE A. 01/29/2021 1:15 PM Medical Record Number: [id number] Patient Account Number: [id number] Date of Birth/Sex: Treating RN: Aug 21, 1928 (87 y.o. Andrew [name] Primary Care Provider: Dimas [name] Other Clinician: Referring Provider: Treating Provider/Extender: [name] in Treatment: 20 Procedure Performed for: Wound #5 Left,Medial Lower Leg Performed By: Physician Ricard Dillon., MD Post Procedure Diagnosis Same as Pre-procedure Notes using silver nitrate stick Electronic Signature(s) Signed: 01/29/2021 4:22:00 PM By: Linton Ham MD Entered By: Linton Ham on 01/29/2021 14:08:42 -------------------------------------------------------------------------------- Physical Exam Details Patient Name: Date of Service: [name], Andrew RGE A. 01/29/2021 1:15 PM Medical Record Number: [id number] Patient Account Number: [id number] Date of Birth/Sex: Treating RN: 06/11/1928 (87 y.o. Andrew [name] Primary Care Provider: Dimas [name] Other Clinician: Referring Provider: Treating Provider/Extender: Andrew [name] in Treatment: 80 Constitutional Patient is hypertensive.. Pulse regular and within target range for patient.Marland Kitchen Respirations regular, non-labored and within target range.. Temperature is normal and within the target range for the patient.Marland Kitchen Appears in no distress. Notes Wound exam; good edema control he has chronic venous insufficiency. His heel is remain closed, this was his original wound. Chronic venous insufficiency. Wounds are hyper granular I used silver nitrate on this Electronic  Signature(s) Signed: 01/29/2021 4:22:00 PM By: Linton Ham MD Entered By: Linton Ham on 01/29/2021 14:10:28 -------------------------------------------------------------------------------- Physician Orders Details Patient Name: Date of Service: [name], Andrew RGE A. 01/29/2021 1:15 PM Medical Record Number: [id number] Patient Account Number: [id number] Date of Birth/Sex: Treating RN: 1928-02-25 (87 y.o. Andrew [name] Primary Care Provider: Dimas [name] Other Clinician: Referring Provider: Treating Provider/Extender: Andrew [name] in Treatment: 20 Verbal / Phone Orders: No Diagnosis Coding ICD-10 Coding Code Description E11.621 Type 2 diabetes mellitus with foot ulcer I87.333 Chronic venous hypertension (idiopathic) with ulcer and inflammation of bilateral lower extremity L97.811 Non-pressure chronic ulcer of other part of right lower leg limited to breakdown of skin L97.829 Non-pressure chronic ulcer of other part of left lower leg with unspecified severity T81.31XA Disruption of external operation (surgical) wound, not elsewhere classified, initial encounter Follow-up Appointments Return Appointment in 2 weeks. Bathing/ Shower/ Hygiene May shower with protection but do not get wound dressing(s) wet. Edema Control - Lymphedema / SCD / Other Bilateral Lower Extremities Elevate legs to the level of the heart or above for 30 minutes daily and/or when sitting, a frequency of: - throughout the day Avoid standing for long periods of time. Exercise regularly Home Health No change in wound care orders this week; continue Home Health for wound care. May utilize formulary equivalent dressing for wound treatment orders unless otherwise specified. Other Home Health Orders/Instructions: - Medi Home Wound Treatment Wound #4 - Lower Leg Wound Laterality: Right, Anterior,  Proximal Cleanser: Soap and Water [facility]) 2 x Per Week/30 Days Discharge Instructions:  May shower and wash wound with dial antibacterial soap and water prior to dressing change. Cleanser: Normal Saline (Home Health) 2 x Per Week/30 Days Discharge Instructions: Cleanse the wound with Normal Saline prior or wound cleanser to applying a clean dressing using gauze sponges, not tissue or cotton balls. Peri-Wound Care: Sween Lotion (Moisturizing lotion) (Home Health) 2 x Per Week/30 Days Discharge Instructions: Apply moisturizing lotion or equivalent moisterizing lotion to legs under wraps Prim Dressing: Hydrofera Blue Classic Foam, 2x2 in (Home Health) 2 x Per Week/30 Days ary Discharge Instructions: Moisten with saline prior to applying to wound bed Secondary Dressing: Woven Gauze Sponge, Non-Sterile 4x4 in (Home Health) 2 x Per Week/30 Days Discharge Instructions: Apply over primary dressing as directed. Compression Wrap: Kerlix Roll 4.5x3.1 (in/yd) (Home Health) 2 x Per Week/30 Days Discharge Instructions: Apply Kerlix and Coban compression , wrap from base of toes to knee Compression Wrap: Coban Self-Adherent Wrap 4x5 (in/yd) (Home Health) 2 x Per Week/30 Days Discharge Instructions: Apply over Kerlix as directed. Wound #5 - Lower Leg Wound Laterality: Left, Medial Cleanser: Soap and Water (Home Health) 2 x Per Week/30 Days Discharge Instructions: May shower and wash wound with dial antibacterial soap and water prior to dressing change. Cleanser: Normal Saline (Home Health) 2 x Per Week/30 Days Discharge Instructions: Cleanse the wound with Normal Saline prior or wound cleanser to applying a clean dressing using gauze sponges, not tissue or cotton balls. Peri-Wound Care: Sween Lotion (Moisturizing lotion) (Home Health) 2 x Per Week/30 Days Discharge Instructions: Apply moisturizing lotion or equivalent moisterizing lotion to legs under wraps Prim Dressing: Hydrofera Blue Classic Foam, 2x2 in (Home Health) 2 x Per Week/30 Days ary Discharge Instructions: Moisten with saline  prior to applying to wound bed Secondary Dressing: Woven Gauze Sponge, Non-Sterile 4x4 in (Home Health) 2 x Per Week/30 Days Discharge Instructions: Apply over primary dressing as directed. Compression Wrap: Kerlix Roll 4.5x3.1 (in/yd) (Home Health) 2 x Per Week/30 Days Discharge Instructions: Apply Kerlix and Coban compression , wrap from base of toes to knee Compression Wrap: Coban Self-Adherent Wrap 4x5 (in/yd) (Home Health) 2 x Per Week/30 Days Discharge Instructions: Apply over Kerlix as directed. Electronic Signature(s) Signed: 01/29/2021 4:22:00 PM By: Linton Ham MD Signed: 01/29/2021 4:47:51 PM By: Baruch Gouty RN, BSN Entered By: Baruch Gouty on 01/29/2021 14:02:06 -------------------------------------------------------------------------------- Problem List Details Patient Name: Date of Service: [name], Andrew RGE A. 01/29/2021 1:15 PM Medical Record Number: [id number] Patient Account Number: [id number] Date of Birth/Sex: Treating RN: [birth year] (87 y.o. Andrew [name] Primary Care Provider: Dimas [name] Other Clinician: Referring Provider: Treating Provider/Extender: Andrew [name] in Treatment: 20 Active Problems ICD-10 Encounter Code Description Active Date MDM Diagnosis E11.621 Type 2 diabetes mellitus with foot ulcer 09/11/2020 No Yes I87.333 Chronic venous hypertension (idiopathic) with ulcer and inflammation of 09/11/2020 No Yes bilateral lower extremity L97.811 Non-pressure chronic ulcer of other part of right lower leg limited to breakdown 09/11/2020 No Yes of skin L97.829 Non-pressure chronic ulcer of other part of left lower leg with unspecified 10/23/2020 No Yes severity T81.31XA Disruption of external operation (surgical) wound, not elsewhere classified, 10/23/2020 No Yes initial encounter Inactive Problems ICD-10 Code Description Active Date Inactive Date L97.422 Non-pressure chronic ulcer of left heel and midfoot with fat  layer exposed 09/11/2020 09/11/2020 L97.821 Non-pressure chronic ulcer of other part of left lower leg limited to breakdown of skin 09/11/2020 09/11/2020  I09.735 Non-pressure chronic ulcer of other part of right lower leg with other specified severity 10/23/2020 10/23/2020 Resolved Problems Electronic Signature(s) Signed: 01/29/2021 4:22:00 PM By: Linton Ham MD Entered By: Linton Ham on 01/29/2021 14:07:57 -------------------------------------------------------------------------------- Progress Note Details Patient Name: Date of Service: [name], Andrew RGE A. 01/29/2021 1:15 PM Medical Record Number: [id number] Patient Account Number: [id number] Date of Birth/Sex: Treating RN: December 25, 1928 (87 y.o. Andrew [name] Primary Care Provider: Dimas [name] Other Clinician: Referring Provider: Treating Provider/Extender: [name] in Treatment: 20 Subjective History of Present Illness (HPI) 09/11/2020 ADMISSION This is a soon-to-be 87 year old man who was hospitalized in June and subsequently spent time at [facility]. At some point he developed a pressure ulcer on his left heel. On discharge he was seen by podiatry and referred to [name] with regards to the possibility of PAD. The patient underwent angiography on 08/10/2020 this showed heavily calcified vessels. The common iliac artery on the left appeared to have calcific disease but there was no gradient. Left common external leg arteries were heavily tortuous left lower extremity limited angiography developed demonstrated a SFA to be helped heavily calcified throughout with multiple greater than 50% stenosis but never frankly occludes he was not felt to be a candidate for endovascular repair. I think they had a minimal look at the right side and it appeared that the SFA was occluded and reconstitutes above the knee. The overall feeling was that if he could not heal his left heel ulcer he will  eventually best be served by a left above-knee amputation The last I can see is that he should have been using a Santyl wet-to-dry dressing to the heel. During his stay with podiatry was also noted that he had venous insufficiency and I think venous insufficiency wounds. His wife tells me that he has been recommended for compression stockings in the past but the patient refused, he has wounds on his bilateral anterior lower legs right greater than the left. Chronic venous inflammation and dry flaking skin. Past medical history; heart failure, right heel wound, paroxysmal atrial fibrillation on Eliquis, abdominal aortic aneurysm with previous repair, COPD, DVT, melanoma on his back, diabetes with a recent hemoglobin A1c of 7.3 however he is not on any treatment [type II], PAD, coronary artery disease, hypertension The patient's most recent arterial studies were on 05/28/2020. This showed an ABI on the right at 1.12 but monophasic waveforms and absent waveforms at the posterior tibial artery. On the left his ABI was 1.47 [noncompressible] he had triphasic waveforms at the ATA and monophasic at the PTA. His TBI on the right was 1.12 on the left and 0.97. It was felt his ABIs were falsely elevated 9/24. The patient followed up with [name] and had noninvasive arterial studies. On the right his ABIs were noncompressible monophasic waveforms with a TBI of 1.45 and a right great toe pressure of 172. On the left he had monophasic waveforms with noncompressible ABIs TBI of 0.79 and a left great toe pressure of 94. He will be seen in follow-up by Andrew [name] in a month's time. The patient also has very significant chronic venous insufficiency 10/8; patient with known PAD. He came in with bilateral lower extremity wounds likely secondary to venous stasis. Still has one on the upper right tibial area. The area on the left leg is healed however he still has an area on the left heel. This needed an extensive debridement  today. His wife brings in notes  apparently [name] ordered an MRI of the left heel. I was unaware of this. I was not aware that he did not been seen by [name] and I do not have any records of this. Apparently MRI called to set up the appointment. I will need to look through epic on this 10/15; this is a patient with known PAD who was revascularized. He also has venous stasis disease. The area that was on the right anterior tibia has actually close down although he has a still a small area on the upper anterior tibia. 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 We have been using Iodoflex on this area and he is required debridements. We tried to get her to agree to an MRI that have been ordered by [name]. However she did not get back to the MRI people or they have not called her back. We will reorder this. He is quite tender around this wound and I wonder about osteomyelitis. 10/29; MRI. Fortunately this did not show any evidence of abscess or osteomyelitis in the left heel. Vein and vascular on 10/21; this is a patient who is previously had an angiogram. He had a heavily calcified aorta and iliac vessels as well as the SFA. He had multiple areas of diffuse 50% stenosis. No interventions were apparently done by [name] he does have an occluded right SFA but with above-knee reconstitution. He was not felt to be a candidate for revascularization for his diffuse areas of greater than 50% stenosis. It was not felt that these were amenable to intervention Since he was last here he went to see dermatology. I believe he had skin cancer extractions from the right area anteriorly and 2 on the left medial. 11/12; left heel actually looks somewhat better but still requiring debridement. We've been using Iodoflex ooRight anterior  mid tibia surgical extraction for a skin cancer. This also required debridement ooThe area on the left lower medial leg looks a lot better and is just about fully epithelialized 12/3; pressure ulcer on the left heel in the setting of PAD. This looks better still requiring debridement we have been using Iodoflex ooRight anterior mid tibia surgical extraction for cancer this also continually requires debridement although the wound measures smaller ooThe area on the left lower medial leg had much more eschar on this than last time this is a deterioration. ooNotable for the fact that home health only wrap the leg halfway and actually had one of the wraps at the level of the wound on the right anterior. 12/17; the area on the left heel is just about closed. The area on the right anterior tibia looks much healthier and is smaller. The area on the left medial ankle I think is also smaller. We have been using Iodoflex to all areas under 3 layer compression bilaterally. He has home health 1/7; left heel is healed. He has an area on the right anterior tibia and the left medial lower leg. Significant chronic venous insufficiency he has home health 1/21; left heel remains healed which is his original wound. He is yet area on the right anterior tibia which is smaller and an area on the left medial lower leg just above the medial malleolus this also looks smaller. He has significant chronic venous insufficiency 2/4; left heel remains  closed. He has an area on the right anterior and a left medial lower leg this is in the setting of chronic venous insufficiency. We have been using Hydrofera Blue and 3 layer compression bilateral Objective Constitutional Patient is hypertensive.. Pulse regular and within target range for patient.Marland Kitchen Respirations regular, non-labored and within target range.. Temperature is normal and within the target range for the patient.Marland Kitchen Appears in no distress. Vitals Time Taken: 1:32 PM,  Height: 67 in, Weight: 185 lbs, BMI: 29, Temperature: 97.8 F, Pulse: 71 bpm, Respiratory Rate: 20 breaths/min, Blood Pressure: 157/70 mmHg. General Notes: Wound exam; good edema control he has chronic venous insufficiency. His heel is remain closed, this was his original wound. Chronic venous insufficiency. Wounds are hyper granular I used silver nitrate on this Integumentary (Hair, Skin) Wound #4 status is Open. Original cause of wound was Gradually Appeared. The wound is located on the Right,Proximal,Anterior Lower Leg. The wound measures 1cm length x 0.7cm width x 0.1cm depth; 0.55cm^2 area and 0.055cm^3 volume. There is Fat Layer (Subcutaneous Tissue) exposed. There is no tunneling or undermining noted. There is a small amount of serosanguineous drainage noted. The wound margin is flat and intact. There is large (67-100%) red granulation within the wound bed. There is no necrotic tissue within the wound bed. Wound #5 status is Open. Original cause of wound was Gradually Appeared. The wound is located on the Left,Medial Lower Leg. The wound measures 0.2cm length x 0.2cm width x 0.1cm depth; 0.031cm^2 area and 0.003cm^3 volume. There is Fat Layer (Subcutaneous Tissue) exposed. There is no tunneling or undermining noted. There is a small amount of serosanguineous drainage noted. The wound margin is distinct with the outline attached to the wound base. There is large (67-100%) red, hyper - granulation within the wound bed. There is no necrotic tissue within the wound bed. 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 An Chemical Cauterization procedure was performed by Ricard Dillon., MD. Post procedure Diagnosis Wound #4: Same as Pre-Procedure Notes: using silver nitrate stick Wound #5 Pre-procedure diagnosis of Wound #5 is a Diabetic Wound/Ulcer of the Lower Extremity located on the Left,Medial Lower Leg . An Chemical Cauterization procedure was performed by Ricard Dillon., MD. Post procedure Diagnosis Wound #5: Same as Pre-Procedure Notes: using silver nitrate stick Plan Follow-up Appointments: Return Appointment in 2 weeks. Bathing/ Shower/ Hygiene: May shower with protection but do not get wound dressing(s) wet. Edema Control - Lymphedema / SCD / Other: Elevate legs to the level of the heart or above for 30 minutes daily and/or when sitting, a frequency of: - throughout the day Avoid standing for long periods of time. Exercise regularly Home Health: No change in wound care orders this week; continue Home Health for wound care. May utilize formulary equivalent dressing for wound treatment orders unless otherwise specified. Other Home Health Orders/Instructions: - Medi Home WOUND #4: - Lower Leg Wound Laterality: Right, Anterior, Proximal Cleanser: Soap and Water (Home Health) 2 x Per Week/30 Days Discharge Instructions: May shower and wash wound with dial antibacterial soap and  water prior to dressing change. Cleanser: Normal Saline (Home Health) 2 x Per Week/30 Days Discharge Instructions: Cleanse the wound with Normal Saline prior or wound cleanser to applying a clean dressing using gauze sponges, not tissue or cotton balls. Peri-Wound Care: Sween Lotion (Moisturizing lotion) (Home Health) 2 x Per Week/30 Days Discharge Instructions: Apply moisturizing lotion or equivalent moisterizing lotion to legs under wraps Prim Dressing: Hydrofera  Blue Classic Foam, 2x2 in (Home Health) 2 x Per Week/30 Days ary Discharge Instructions: Moisten with saline prior to applying to wound bed Secondary Dressing: Woven Gauze Sponge, Non-Sterile 4x4 in (Home Health) 2 x Per Week/30 Days Discharge Instructions: Apply over primary dressing as directed. Com pression Wrap: Kerlix Roll 4.5x3.1 (in/yd) (Home Health) 2 x Per Week/30 Days Discharge Instructions: Apply Kerlix and Coban compression , wrap from base of toes to knee Com pression Wrap: Coban Self-Adherent Wrap 4x5 (in/yd) (Home Health) 2 x Per Week/30 Days Discharge Instructions: Apply over Kerlix as directed. WOUND #5: - Lower Leg Wound Laterality: Left, Medial Cleanser: Soap and Water (Home Health) 2 x Per Week/30 Days Discharge Instructions: May shower and wash wound with dial antibacterial soap and water prior to dressing change. Cleanser: Normal Saline (Home Health) 2 x Per Week/30 Days Discharge Instructions: Cleanse the wound with Normal Saline prior or wound cleanser to applying a clean dressing using gauze sponges, not tissue or cotton balls. Peri-Wound Care: Sween Lotion (Moisturizing lotion) (Home Health) 2 x Per Week/30 Days Discharge Instructions: Apply moisturizing lotion or equivalent moisterizing lotion to legs under wraps Prim Dressing: Hydrofera Blue Classic Foam, 2x2 in (Home Health) 2 x Per Week/30 Days ary Discharge Instructions: Moisten with saline prior to applying to wound bed Secondary Dressing: Woven Gauze Sponge, Non-Sterile 4x4 in (Home Health) 2 x Per Week/30 Days Discharge Instructions: Apply over primary dressing as directed. Com pression Wrap: Kerlix Roll 4.5x3.1 (in/yd) (Home Health) 2 x Per Week/30 Days Discharge Instructions: Apply Kerlix and Coban compression , wrap from base of toes to knee Com pression Wrap: Coban Self-Adherent Wrap 4x5 (in/yd) (Home Health) 2 x Per Week/30 Days Discharge Instructions: Apply over Kerlix as directed. 1. I continued with  Hydrofera Blue under 3 layer compression bilaterally 2. We are making steady progress here. 3. We looked at compression stockings, his wife says that she has not been able to get these on him in the past. Can look at at a [employer]) Signed: 01/29/2021 4:22:00 PM By: Linton Ham MD Entered By: Linton Ham on 01/29/2021 14:11:23 -------------------------------------------------------------------------------- SuperBill Details Patient Name: Date of Service: Andrew [name], Andrew RGE A. 01/29/2021 Medical Record Number: [id number] Patient Account Number: [id number] Date of Birth/Sex: Treating RN: 1928/06/25 (87 y.o. Andrew [name] Primary Care Provider: Dimas [name] Other Clinician: Referring Provider: Treating Provider/Extender: Andrew [name] in Treatment: 20 Diagnosis Coding ICD-10 Codes Code Description E11.621 Type 2 diabetes mellitus with foot ulcer I87.333 Chronic venous hypertension (idiopathic) with ulcer and inflammation of bilateral lower extremity L97.811 Non-pressure chronic ulcer of other part of right lower leg limited to breakdown of skin L97.829 Non-pressure chronic ulcer of other part of left lower leg with unspecified severity T81.31XA Disruption of external operation (surgical) wound, not elsewhere classified, initial encounter Facility Procedures CPT4 Code: [id number] Description: 87867 - CHEM CAUT GRANULATION TISS ICD-10 Diagnosis Description L97.811 Non-pressure chronic ulcer of other part of right lower leg limited to breakdow L97.829 Non-pressure chronic ulcer of other part of left lower leg with unspecified sev Modifier: n of  skin erity Quantity: 2 Physician Procedures : CPT4 Code Description Modifier [phone] [id number] - WC PHYS CHEM CAUT GRAN TISSUE ICD-10 Diagnosis Description L97.811 Non-pressure chronic ulcer of other part of right lower leg limited to breakdown of skin L97.829 Non-pressure chronic ulcer of other part   of left lower leg with unspecified severity Quantity: 2 Electronic Signature(s) Signed: 01/29/2021 4:22:00 PM By: Linton Ham MD Entered By: Linton Ham on 01/29/2021 14:11:35

## 2021-02-01 NOTE — Progress Notes (Signed)
Andrew [name], Andrew [name] ([id number]) Visit Report for 01/29/2021 Arrival Information Details Patient Name: Date of Service: Andrew [name], GEO [facility] A. 01/29/2021 1:15 PM Medical Record Number: [id number] Patient Account Number: [id number] Date of Birth/Sex: Treating RN: 1928-03-15 (87 y.o. [name] Primary Care Reis Pienta: Dimas Chyle Other Clinician: Referring Berlynn Warsame: Treating Zakhia Seres/Extender: Donalee Citrin in Treatment: 37 Visit Information History Since Last Visit Added or deleted any medications: No Patient Arrived: Wheel Chair Any new allergies or adverse reactions: No Arrival Time: 13:31 Had a fall or experienced change in No Accompanied By: wife activities of daily living that may affect Transfer Assistance: None risk of falls: Patient Identification Verified: Yes Signs or symptoms of abuse/neglect since last visito No Secondary Verification Process Completed: Yes Hospitalized since last visit: No Patient Requires Transmission-Based Precautions: No Implantable device outside of the clinic excluding No Patient Has Alerts: No cellular tissue based products placed in the center since last visit: Has Dressing in Place as Prescribed: Yes Pain Present Now: No Electronic Signature(s) Signed: 02/01/2021 9:06:40 AM By: Sandre Kitty Entered By: Sandre Kitty on 01/29/2021 13:32:16 -------------------------------------------------------------------------------- Lower Extremity Assessment Details Patient Name: Date of Service: Andrew [name], [name] 01/29/2021 1:15 PM Medical Record Number: [id number] Patient Account Number: [id number] Date of Birth/Sex: Treating RN: [birth year] (87 y.o. [name] Primary Care Leler Brion: Dimas Chyle Other Clinician: Referring Claudine Stallings: Treating Hughes Wyndham/Extender: Donalee Citrin in Treatment: 20 Edema Assessment Assessed: [Left: No] [Right: No] Edema: [Left: No] [Right: No] Calf Left:  Right: Point of Measurement: 44 cm From Medial Instep 32 cm 32 cm Ankle Left: Right: Point of Measurement: 10 cm From Medial Instep 21 cm 21.5 cm Vascular Assessment Pulses: Dorsalis Pedis Palpable: [Left:Yes] [Right:Yes] Electronic Signature(s) Signed: 01/29/2021 4:47:51 PM By: Baruch Gouty RN, BSN Entered By: Baruch Gouty on 01/29/2021 13:56:56 -------------------------------------------------------------------------------- Multi Wound Chart Details Patient Name: Date of Service: Andrew [name], [name] 01/29/2021 1:15 PM Medical Record Number: [id number] Patient Account Number: [id number] Date of Birth/Sex: Treating RN: 16-Apr-1928 (87 y.o. [name] Primary Care Karly Pitter: Dimas Chyle Other Clinician: Referring Jun Osment: Treating Crystalyn Delia/Extender: Donalee Citrin in Treatment: 20 Vital Signs Height(in): 15 Pulse(bpm): 16 Weight(lbs): 185 Blood Pressure(mmHg): 157/70 Body Mass Index(BMI): 29 Temperature(F): 97.8 Respiratory Rate(breaths/min): 20 Photos: [4:No Photos Right, Proximal, Anterior Lower Leg] [5:No Photos Left, Medial Lower Leg] [N/A:N/A N/A] Wound Location: [4:Gradually Appeared] [5:Gradually Appeared] [N/A:N/A] Wounding Event: [4:Venous Leg Ulcer] [5:Diabetic Wound/Ulcer of the Lower] [N/A:N/A] Primary Etiology: [4:Congestive Heart Failure, Coronary Congestive Heart Failure, Coronary N/A] [5:Extremity] Comorbid History: [4:Artery Disease, Hypertension, Peripheral Arterial Disease, Peripheral Peripheral Arterial Disease, Peripheral Venous Disease, Type II Diabetes 10/09/2020] [5:Artery Disease, Hypertension, Venous Disease, Type II Diabetes 10/23/2020]  [N/A:N/A] Date Acquired: [4:16] [5:14] [N/A:N/A] Weeks of Treatment: [4:Open] [5:Open] [N/A:N/A] Wound Status: [4:No] [5:Yes] [N/A:N/A] Clustered Wound: [4:N/A] [5:2] [N/A:N/A] Clustered Quantity: [4:1x0.7x0.1] [5:0.2x0.2x0.1] [N/A:N/A] Measurements L x W x D (cm) [4:0.55]  [5:0.031] [N/A:N/A] A (cm) : rea [4:0.055] [5:0.003] [N/A:N/A] Volume (cm) : [4:-485.10%] [5:91.80%] [N/A:N/A] % Reduction in Area: [4:-511.10%] [5:92.10%] [N/A:N/A] % Reduction in Volume: [4:Full Thickness Without Exposed] [5:Grade 2] [N/A:N/A] Classification: [4:Support Structures Small] [5:Small] [N/A:N/A] Exudate Amount: [4:Serosanguineous] [5:Serosanguineous] [N/A:N/A] Exudate Type: [4:red, brown] [5:red, brown] [N/A:N/A] Exudate Color: [4:Flat and Intact] [5:Distinct, outline attached] [N/A:N/A] Wound Margin: [4:Large (67-100%)] [5:Large (67-100%)] [N/A:N/A] Granulation Amount: [4:Red] [5:Red, Hyper-granulation] [N/A:N/A] Granulation Quality: [4:None Present (0%)] [5:None Present (0%)] [N/A:N/A] Necrotic Amount: [4:Fat Layer (Subcutaneous Tissue): Yes Fat Layer (Subcutaneous Tissue): Yes N/A] Exposed Structures: [4:Fascia:  No Tendon: No Muscle: No Joint: No Bone: No Medium (34-66%)] [5:Fascia: No Tendon: No Muscle: No Joint: No Bone: No Large (67-100%)] [N/A:N/A] Epithelialization: [4:Chemical Cauterization] [5:Chemical Cauterization] [N/A:N/A] Treatment Notes Electronic Signature(s) Signed: 01/29/2021 4:22:00 PM By: Linton Ham MD Signed: 01/29/2021 4:47:51 PM By: Baruch Gouty RN, BSN Entered By: Linton Ham on 01/29/2021 14:08:05 -------------------------------------------------------------------------------- Multi-Disciplinary Care Plan Details Patient Name: Date of Service: Andrew [name], [name] 01/29/2021 1:15 PM Medical Record Number: [id number] Patient Account Number: [id number] Date of Birth/Sex: Treating RN: 26-Sep-1928 (87 y.o. [name] Primary Care Rayssa Atha: Dimas Chyle Other Clinician: Referring Zakk Borgen: Treating Moss Berry/Extender: Donalee Citrin in Treatment: 20 Active Inactive Wound/Skin Impairment Nursing Diagnoses: Impaired tissue integrity Goals: Patient/caregiver will verbalize understanding of skin care  regimen Date Initiated: 10/23/2020 Target Resolution Date: 02/26/2021 Goal Status: Active Ulcer/skin breakdown will have a volume reduction of 50% by week 8 Date Initiated: 09/11/2020 Date Inactivated: 10/23/2020 Target Resolution Date: 10/16/2020 Goal Status: Unmet Unmet Reason: PAD Interventions: Provide education on ulcer and skin care Notes: Electronic Signature(s) Signed: 01/29/2021 4:47:51 PM By: Baruch Gouty RN, BSN Entered By: Baruch Gouty on 01/29/2021 13:52:33 -------------------------------------------------------------------------------- Pain Assessment Details Patient Name: Date of Service: Andrew [name], [name] 01/29/2021 1:15 PM Medical Record Number: [id number] Patient Account Number: [id number] Date of Birth/Sex: Treating RN: 1928-12-16 (87 y.o. [name] Primary Care Rakwon Letourneau: Dimas Chyle Other Clinician: Referring Mehul Rudin: Treating Chesnie Capell/Extender: Donalee Citrin in Treatment: 20 Active Problems Location of Pain Severity and Description of Pain Patient Has Paino No Site Locations Pain Management and Medication Current Pain Management: Electronic Signature(s) Signed: 01/29/2021 4:47:51 PM By: Baruch Gouty RN, BSN Signed: 02/01/2021 9:06:40 AM By: Sandre Kitty Entered By: Sandre Kitty on 01/29/2021 13:32:45 -------------------------------------------------------------------------------- Patient/Caregiver Education Details Patient Name: Date of Service: Andrew [name], [name] 2/4/2022andnbsp1:15 PM Medical Record Number: [id number] Patient Account Number: [id number] Date of Birth/Gender: Treating RN: 07-16-1928 (87 y.o. [name] Primary Care Physician: Dimas Chyle Other Clinician: Referring Physician: Treating Physician/Extender: Donalee Citrin in Treatment: 20 Education Assessment Education Provided To: Patient Education Topics Provided Venous: Methods:  Explain/Verbal Responses: Reinforcements needed, State content correctly Wound/Skin Impairment: Methods: Explain/Verbal Responses: Reinforcements needed, State content correctly Electronic Signature(s) Signed: 01/29/2021 4:47:51 PM By: Baruch Gouty RN, BSN Entered By: Baruch Gouty on 01/29/2021 13:53:17 -------------------------------------------------------------------------------- Wound Assessment Details Patient Name: Date of Service: Andrew [name], [name] 01/29/2021 1:15 PM Medical Record Number: [id number] Patient Account Number: [id number] Date of Birth/Sex: Treating RN: January 19, 1928 (87 y.o. [name] Primary Care Athleen Feltner: Dimas Chyle Other Clinician: Referring Velora Horstman: Treating Abrea Henle/Extender: Donalee Citrin in Treatment: 20 Wound Status Wound Number: 4 Primary Venous Leg Ulcer Etiology: Wound Location: Right, Proximal, Anterior Lower Leg Wound Open Wounding Event: Gradually Appeared Status: Date Acquired: 10/09/2020 Comorbid Congestive Heart Failure, Coronary Artery Disease, Hypertension, Weeks Of Treatment: 16 History: Peripheral Arterial Disease, Peripheral Venous Disease, Type II Clustered Wound: No Diabetes Wound Measurements Length: (cm) 1 Width: (cm) 0.7 Depth: (cm) 0.1 Area: (cm) 0.55 Volume: (cm) 0.055 % Reduction in Area: -485.1% % Reduction in Volume: -511.1% Epithelialization: Medium (34-66%) Tunneling: No Undermining: No Wound Description Classification: Full Thickness Without Exposed Support Structures Wound Margin: Flat and Intact Exudate Amount: Small Exudate Type: Serosanguineous Exudate Color: red, brown Foul Odor After Cleansing: No Slough/Fibrino No Wound Bed Granulation Amount: Large (67-100%) Exposed Structure Granulation Quality: Red Fascia Exposed: No Necrotic Amount: None Present (0%) Fat Layer (Subcutaneous Tissue) Exposed: Yes Tendon  Exposed: No Muscle Exposed: No Joint Exposed:  No Bone Exposed: No Electronic Signature(s) Signed: 01/29/2021 4:47:51 PM By: Baruch Gouty RN, BSN Entered By: Baruch Gouty on 01/29/2021 13:57:14 -------------------------------------------------------------------------------- Wound Assessment Details Patient Name: Date of Service: Andrew [name], [name] 01/29/2021 1:15 PM Medical Record Number: [id number] Patient Account Number: [id number] Date of Birth/Sex: Treating RN: Feb 01, 1928 (87 y.o. [name] Primary Care Janisha Bueso: Dimas Chyle Other Clinician: Referring Joedy Eickhoff: Treating Jyrah Blye/Extender: Donalee Citrin in Treatment: 20 Wound Status Wound Number: 5 Primary Diabetic Wound/Ulcer of the Lower Extremity Etiology: Wound Location: Left, Medial Lower Leg Wound Open Wounding Event: Gradually Appeared Status: Date Acquired: 10/23/2020 Comorbid Congestive Heart Failure, Coronary Artery Disease, Hypertension, Weeks Of Treatment: 14 History: Peripheral Arterial Disease, Peripheral Venous Disease, Type II Clustered Wound: Yes Diabetes Wound Measurements Length: (cm) 0.2 Width: (cm) 0.2 Depth: (cm) 0.1 Clustered Quantity: 2 Area: (cm) 0.031 Volume: (cm) 0.003 % Reduction in Area: 91.8% % Reduction in Volume: 92.1% Epithelialization: Large (67-100%) Tunneling: No Undermining: No Wound Description Classification: Grade 2 Wound Margin: Distinct, outline attached Exudate Amount: Small Exudate Type: Serosanguineous Exudate Color: red, brown Foul Odor After Cleansing: No Slough/Fibrino No Wound Bed Granulation Amount: Large (67-100%) Exposed Structure Granulation Quality: Red, Hyper-granulation Fascia Exposed: No Necrotic Amount: None Present (0%) Fat Layer (Subcutaneous Tissue) Exposed: Yes Tendon Exposed: No Muscle Exposed: No Joint Exposed: No Bone Exposed: No Electronic Signature(s) Signed: 01/29/2021 4:47:51 PM By: Baruch Gouty RN, BSN Entered By: Baruch Gouty on  01/29/2021 13:57:31 -------------------------------------------------------------------------------- Vitals Details Patient Name: Date of Service: Andrew [name], [name] 01/29/2021 1:15 PM Medical Record Number: [id number] Patient Account Number: [id number] Date of Birth/Sex: Treating RN: 1928-08-16 (87 y.o. [name] Primary Care Addilyn Satterwhite: Dimas Chyle Other Clinician: Referring Mallory Schaad: Treating Huston Stonehocker/Extender: Donalee Citrin in Treatment: 20 Vital Signs Time Taken: 13:32 Temperature (F): 97.8 Height (in): 67 Pulse (bpm): 71 Weight (lbs): 185 Respiratory Rate (breaths/min): 20 Body Mass Index (BMI): 29 Blood Pressure (mmHg): 157/70 Reference Range: 80 - 120 mg / dl Electronic Signature(s) Signed: 02/01/2021 9:06:40 AM By: Sandre Kitty Entered By: Sandre Kitty on 01/29/2021 13:32:39

## 2021-02-02 DIAGNOSIS — J449 Chronic obstructive pulmonary disease, unspecified: Secondary | ICD-10-CM | POA: Diagnosis not present

## 2021-02-02 DIAGNOSIS — N184 Chronic kidney disease, stage 4 (severe): Secondary | ICD-10-CM | POA: Diagnosis not present

## 2021-02-02 DIAGNOSIS — I872 Venous insufficiency (chronic) (peripheral): Secondary | ICD-10-CM | POA: Diagnosis not present

## 2021-02-02 DIAGNOSIS — E1122 Type 2 diabetes mellitus with diabetic chronic kidney disease: Secondary | ICD-10-CM | POA: Diagnosis not present

## 2021-02-02 DIAGNOSIS — I480 Paroxysmal atrial fibrillation: Secondary | ICD-10-CM | POA: Diagnosis not present

## 2021-02-02 DIAGNOSIS — S81801D Unspecified open wound, right lower leg, subsequent encounter: Secondary | ICD-10-CM | POA: Diagnosis not present

## 2021-02-02 DIAGNOSIS — I255 Ischemic cardiomyopathy: Secondary | ICD-10-CM | POA: Diagnosis not present

## 2021-02-02 DIAGNOSIS — I130 Hypertensive heart and chronic kidney disease with heart failure and stage 1 through stage 4 chronic kidney disease, or unspecified chronic kidney disease: Secondary | ICD-10-CM | POA: Diagnosis not present

## 2021-02-02 DIAGNOSIS — E1151 Type 2 diabetes mellitus with diabetic peripheral angiopathy without gangrene: Secondary | ICD-10-CM | POA: Diagnosis not present

## 2021-02-03 ENCOUNTER — Other Ambulatory Visit (HOSPITAL_COMMUNITY): Payer: Self-pay

## 2021-02-03 NOTE — Progress Notes (Signed)
Paramedicine Encounter    Patient ID: MINOR IDEN, male    DOB: Nov 01, 1928, 87 y.o.   MRN: [id number]   Patient Care Team: [name], MD as PCP - General (Family Medicine) Larey Dresser, MD as PCP - Advanced Heart Failure (Cardiology) Larey Dresser, MD as Consulting Physician (Cardiology) Jorge Ny, LCSW as Social Worker (Licensed Clinical Social Worker) Gardiner Barefoot, DPM as Consulting Physician (Podiatry) [name], MD (Family Medicine) Patient, No Pcp Per (General Practice)  Patient Active Problem List   Diagnosis Date Noted  . Acute respiratory failure with hypercapnia ([place]) 06/02/2020  . Acute metabolic encephalopathy 21/19/4174  . CKD (chronic kidney disease), stage IV ([place]) 06/02/2020  . PAF (paroxysmal atrial fibrillation) ([place]) 06/02/2020  . Hypothyroidism 06/02/2020  . Chronic anticoagulation 06/02/2020  . Debility 06/01/2020  . Lumbar spondylosis 10/04/2018  . Chronic respiratory failure with hypoxia ([place]) 09/16/2018  . Pain of right thumb 06/19/2018  . Bilateral recurrent inguinal hernias 11/28/2017  . Carotid artery stenosis 01/12/2016  . Chronic systolic CHF (congestive heart failure) ([place]) 12/02/2015  . 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 ([place]) 10/16/2015  . PVD (peripheral vascular disease) ([place])   . Hereditary and idiopathic peripheral neuropathy 10/02/2015  . Peripelvic (lymphatic) cyst   . Pernicious anemia 07/15/2015  . Bilateral renal cysts 07/12/2015  . Lung nodule, 33mm RML CT 07/12/15 07/12/2015  . Stage 3 chronic kidney disease ([place]) 06/14/2015  . Paroxysmal atrial fibrillation ([place]) 05/28/2015  . AAA (abdominal aortic aneurysm) without rupture ([place]) 09/30/2014  . Cardiomyopathy, ischemic 05/23/2014  . DM (diabetes mellitus), type 2 with renal complications ([place]) 08/08/4817  . Depression, major, single episode, complete remission ([place]) 03/18/2010  . Hypothyroidism 02/03/2009  . Osteoarthritis 05/27/2008  .  Hyperlipidemia associated with type 2 diabetes mellitus ([place]) 06/14/2007  . Hypertension associated with diabetes ([place]) 06/14/2007  . GERD 06/14/2007    Current Outpatient Medications:  .  acetaminophen (TYLENOL) 500 MG tablet, Take 2 tablets (1,000 mg total) by mouth every 8 (eight) hours as needed., Disp: 30 tablet, Rfl: 0 .  amiodarone (PACERONE) 200 MG tablet, Take 0.5 tablets (100 mg total) by mouth daily., Disp: 45 tablet, Rfl: 3 .  atorvastatin (LIPITOR) 80 MG tablet, Take 1 tablet (80 mg total) by mouth daily. (Patient taking differently: Take 80 mg by mouth every evening.), Disp: 30 tablet, Rfl: 3 .  carvedilol (COREG) 12.5 MG tablet, Take 12.5 mg by mouth 2 (two) times daily with a meal., Disp: , Rfl:  .  cilostazol (PLETAL) 100 MG tablet, Take 1 tablet (100 mg total) by mouth 2 (two) times daily., Disp: 180 tablet, Rfl: 3 .  ELIQUIS 2.5 MG TABS tablet, TAKE 1 TABLET BY MOUTH TWICE DAILY., Disp: 180 tablet, Rfl: 1 .  empagliflozin (JARDIANCE) 10 MG TABS tablet, Take 1 tablet (10 mg total) by mouth daily before breakfast., Disp: 90 tablet, Rfl: 3 .  ezetimibe (ZETIA) 10 MG tablet, TAKE (1) TABLET DAILY., Disp: 90 tablet, Rfl: 0 .  FLUoxetine (PROZAC) 20 MG capsule, TAKE (1) CAPSULE DAILY., Disp: 90 capsule, Rfl: 0 .  gabapentin (NEURONTIN) 100 MG capsule, Take 100 mg by mouth 3 (three) times daily., Disp: , Rfl:  .  glucose blood (ACCU-CHEK AVIVA PLUS) test strip, CHECK BLOOD SUGAR 4 TIMES A DAY., Disp: 100 each, Rfl: 11 .  hydrALAZINE (APRESOLINE) 50 MG tablet, Take 1 tablet (50 mg total) by mouth 3 (three) times daily., Disp: 90 tablet, Rfl: 3 .  isosorbide  mononitrate (IMDUR) 30 MG 24 hr tablet, Take 1 tablet (30 mg total) by mouth daily., Disp: 30 tablet, Rfl: 3 .  levothyroxine (SYNTHROID) 100 MCG tablet, TAKE 1 TABLET BEFORE BREAKFAST., Disp: 90 tablet, Rfl: 0 .  Multiple Vitamins-Minerals (MULTIVITAMIN ADULTS 50+) TABS, Take 1 tablet by mouth daily., Disp: , Rfl:  .  pantoprazole  (PROTONIX) 40 MG tablet, Take 1 tablet (40 mg total) by mouth 2 (two) times daily. (Patient taking differently: Take 40 mg by mouth 2 (two) times daily.), Disp: , Rfl:  .  potassium chloride SA (KLOR-CON) 20 MEQ tablet, Take 20 mEq by mouth daily., Disp: , Rfl:  .  tiotropium (SPIRIVA) 18 MCG inhalation capsule, Place 18 mcg into inhaler and inhale daily as needed (for wheezing)., Disp: , Rfl:  .  torsemide (DEMADEX) 20 MG tablet, Take 4 tablets (80 mg total) by mouth every morning AND 2 tablets (40 mg total) every evening., Disp: 180 tablet, Rfl: 11 .  HYDROcodone-acetaminophen (NORCO/VICODIN) 5-325 MG tablet, TAKE 1 TABLET EVERY 6 HOURS AS NEEDED FOR MODERATE PAIN. (Patient not taking: No sig reported), Disp: 12 tablet, Rfl: 0 .  metolazone (ZAROXOLYN) 2.5 MG tablet, Take 1 tablet (2.5 mg total) by mouth as directed. By [facility] (Patient not taking: No sig reported), Disp: 3 tablet, Rfl: 0 .  nitroGLYCERIN (NITROSTAT) 0.4 MG SL tablet, DISSOLVE 1 TABLET UNDER TONGUE AS NEEDED FOR CHEST PAIN,MAY REPEAT IN5 MINUTES FOR 2 DOSES. (Patient not taking: No sig reported), Disp: 25 tablet, Rfl: 3 Allergies  Allergen Reactions  . Metoprolol Shortness Of Breath, Palpitations and Other (See Comments)    Heart starts racing. Shallow breathing; pt states he also get skin irritation on his legs  . Metformin Hives and Swelling    On legs  . Metformin And Related Nausea And Vomiting      Social History   Socioeconomic History  . Marital status: Married    Spouse name: Not on file  . Number of children: Not on file  . Years of education: Not on file  . Highest education level: Not on file  Occupational History  . Occupation: Retired  Tobacco Use  . Smoking status: Never Smoker  . Smokeless tobacco: Never Used  . Tobacco comment: quit atleast 25 yrs ago, per pt  Vaping Use  . Vaping Use: Never used  Substance and Sexual Activity  . Alcohol use: Never    Comment: socially  . Drug use: Never  .  Sexual activity: Not Currently  Other Topics Concern  . Not on file  Social History Narrative   ** Merged History Encounter **       Pt lives in [place] with spouse. Retired from [employer].  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   Social Determinants of Health   Financial Resource Strain: Low Risk   . Difficulty of Paying Living Expenses: Not hard at all  Food Insecurity: No Food Insecurity  . Worried About Charity fundraiser in the Last Year: Never true  . Ran Out of Food in the Last Year: Never true  Transportation Needs: No Transportation Needs  . Lack of Transportation (Medical): No  . Lack of Transportation (Non-Medical): No  Physical Activity: Inactive  . Days of Exercise per Week: 0 days  . Minutes of Exercise per Session: 0 min  Stress: No Stress Concern Present  . Feeling of Stress : Not at all  Social Connections: Moderately Isolated  . Frequency of  Communication with Friends and Family: More than three times a week  . Frequency of Social Gatherings with Friends and Family: More than three times a week  . Attends Religious Services: Never  . Active Member of Clubs or Organizations: No  . Attends Archivist Meetings: Never  . Marital Status: Married  Human resources officer Violence: Not At Risk  . Fear of Current or Ex-Partner: No  . Emotionally Abused: No  . Physically Abused: No  . Sexually Abused: No    Physical Exam      Future Appointments  Date Time Provider [place]  02/12/2021 12:30 PM Ricard Dillon, MD [facility]  04/19/2021  2:00 PM Larey Dresser, MD [facility] None    BP (!) 96/52   Pulse 70   Temp 98.9 F (37.2 C)   Resp 18   SpO2 95%   Weight yesterday-? Last visit weight-169  Pt reports doing ok.  He has missed almost a whole week worth of meds.  His wife reports he has been sleeping a lot lately-mostly through the night about 12-14 hrs. Also he has had  increased BM's. Looser than normal, but she reports he has had increased sugar and chocolate intake too, so that could play a part in it but I advised her if it worsens to let someone know.  Denies bleeding issues. Denies increased sob. No c/p.  meds verified and 2 pill boxes refilled.   Marylouise Stacks, [facility] Paramedic  02/03/21

## 2021-02-04 ENCOUNTER — Telehealth: Payer: Self-pay | Admitting: *Deleted

## 2021-02-04 NOTE — Telephone Encounter (Signed)
Home health certification and plan of care sign and faxed

## 2021-02-05 DIAGNOSIS — I480 Paroxysmal atrial fibrillation: Secondary | ICD-10-CM | POA: Diagnosis not present

## 2021-02-05 DIAGNOSIS — J449 Chronic obstructive pulmonary disease, unspecified: Secondary | ICD-10-CM | POA: Diagnosis not present

## 2021-02-05 DIAGNOSIS — E1122 Type 2 diabetes mellitus with diabetic chronic kidney disease: Secondary | ICD-10-CM | POA: Diagnosis not present

## 2021-02-05 DIAGNOSIS — I130 Hypertensive heart and chronic kidney disease with heart failure and stage 1 through stage 4 chronic kidney disease, or unspecified chronic kidney disease: Secondary | ICD-10-CM | POA: Diagnosis not present

## 2021-02-05 DIAGNOSIS — N184 Chronic kidney disease, stage 4 (severe): Secondary | ICD-10-CM | POA: Diagnosis not present

## 2021-02-05 DIAGNOSIS — I872 Venous insufficiency (chronic) (peripheral): Secondary | ICD-10-CM | POA: Diagnosis not present

## 2021-02-05 DIAGNOSIS — S81801D Unspecified open wound, right lower leg, subsequent encounter: Secondary | ICD-10-CM | POA: Diagnosis not present

## 2021-02-05 DIAGNOSIS — E1151 Type 2 diabetes mellitus with diabetic peripheral angiopathy without gangrene: Secondary | ICD-10-CM | POA: Diagnosis not present

## 2021-02-05 DIAGNOSIS — I255 Ischemic cardiomyopathy: Secondary | ICD-10-CM | POA: Diagnosis not present

## 2021-02-08 DIAGNOSIS — N184 Chronic kidney disease, stage 4 (severe): Secondary | ICD-10-CM | POA: Diagnosis not present

## 2021-02-08 DIAGNOSIS — I872 Venous insufficiency (chronic) (peripheral): Secondary | ICD-10-CM | POA: Diagnosis not present

## 2021-02-08 DIAGNOSIS — I480 Paroxysmal atrial fibrillation: Secondary | ICD-10-CM | POA: Diagnosis not present

## 2021-02-08 DIAGNOSIS — J449 Chronic obstructive pulmonary disease, unspecified: Secondary | ICD-10-CM | POA: Diagnosis not present

## 2021-02-08 DIAGNOSIS — E1122 Type 2 diabetes mellitus with diabetic chronic kidney disease: Secondary | ICD-10-CM | POA: Diagnosis not present

## 2021-02-08 DIAGNOSIS — I130 Hypertensive heart and chronic kidney disease with heart failure and stage 1 through stage 4 chronic kidney disease, or unspecified chronic kidney disease: Secondary | ICD-10-CM | POA: Diagnosis not present

## 2021-02-08 DIAGNOSIS — E1151 Type 2 diabetes mellitus with diabetic peripheral angiopathy without gangrene: Secondary | ICD-10-CM | POA: Diagnosis not present

## 2021-02-08 DIAGNOSIS — S81801D Unspecified open wound, right lower leg, subsequent encounter: Secondary | ICD-10-CM | POA: Diagnosis not present

## 2021-02-08 DIAGNOSIS — I255 Ischemic cardiomyopathy: Secondary | ICD-10-CM | POA: Diagnosis not present

## 2021-02-11 DIAGNOSIS — E1122 Type 2 diabetes mellitus with diabetic chronic kidney disease: Secondary | ICD-10-CM | POA: Diagnosis not present

## 2021-02-11 DIAGNOSIS — N184 Chronic kidney disease, stage 4 (severe): Secondary | ICD-10-CM | POA: Diagnosis not present

## 2021-02-11 DIAGNOSIS — I872 Venous insufficiency (chronic) (peripheral): Secondary | ICD-10-CM | POA: Diagnosis not present

## 2021-02-11 DIAGNOSIS — E1151 Type 2 diabetes mellitus with diabetic peripheral angiopathy without gangrene: Secondary | ICD-10-CM | POA: Diagnosis not present

## 2021-02-11 DIAGNOSIS — I480 Paroxysmal atrial fibrillation: Secondary | ICD-10-CM | POA: Diagnosis not present

## 2021-02-11 DIAGNOSIS — S81801D Unspecified open wound, right lower leg, subsequent encounter: Secondary | ICD-10-CM | POA: Diagnosis not present

## 2021-02-11 DIAGNOSIS — J449 Chronic obstructive pulmonary disease, unspecified: Secondary | ICD-10-CM | POA: Diagnosis not present

## 2021-02-11 DIAGNOSIS — I255 Ischemic cardiomyopathy: Secondary | ICD-10-CM | POA: Diagnosis not present

## 2021-02-11 DIAGNOSIS — I130 Hypertensive heart and chronic kidney disease with heart failure and stage 1 through stage 4 chronic kidney disease, or unspecified chronic kidney disease: Secondary | ICD-10-CM | POA: Diagnosis not present

## 2021-02-12 ENCOUNTER — Other Ambulatory Visit: Payer: Self-pay

## 2021-02-12 ENCOUNTER — Encounter (HOSPITAL_BASED_OUTPATIENT_CLINIC_OR_DEPARTMENT_OTHER): Payer: Medicare PPO | Admitting: Internal Medicine

## 2021-02-12 DIAGNOSIS — I480 Paroxysmal atrial fibrillation: Secondary | ICD-10-CM | POA: Diagnosis not present

## 2021-02-12 DIAGNOSIS — I87333 Chronic venous hypertension (idiopathic) with ulcer and inflammation of bilateral lower extremity: Secondary | ICD-10-CM | POA: Diagnosis not present

## 2021-02-12 DIAGNOSIS — L97829 Non-pressure chronic ulcer of other part of left lower leg with unspecified severity: Secondary | ICD-10-CM | POA: Diagnosis not present

## 2021-02-12 DIAGNOSIS — T8131XA Disruption of external operation (surgical) wound, not elsewhere classified, initial encounter: Secondary | ICD-10-CM | POA: Diagnosis not present

## 2021-02-12 DIAGNOSIS — I714 Abdominal aortic aneurysm, without rupture: Secondary | ICD-10-CM | POA: Diagnosis not present

## 2021-02-12 DIAGNOSIS — B351 Tinea unguium: Secondary | ICD-10-CM | POA: Diagnosis not present

## 2021-02-12 DIAGNOSIS — I872 Venous insufficiency (chronic) (peripheral): Secondary | ICD-10-CM | POA: Diagnosis not present

## 2021-02-12 DIAGNOSIS — L97811 Non-pressure chronic ulcer of other part of right lower leg limited to breakdown of skin: Secondary | ICD-10-CM | POA: Diagnosis not present

## 2021-02-12 DIAGNOSIS — E11621 Type 2 diabetes mellitus with foot ulcer: Secondary | ICD-10-CM | POA: Diagnosis not present

## 2021-02-12 NOTE — Progress Notes (Signed)
LYRIK, BURESH ([id number]) Visit Report for 02/12/2021 HPI Details Patient Name: Date of Service: Andrew [name], [name] 02/12/2021 12:30 PM Medical Record Number: [id number] Patient Account Number: [id number] Date of Birth/Sex: Treating RN: Jul 04, 1928 (87 y.o. [name] Primary Care Provider: Dimas Chyle Other Clinician: Referring Provider: Treating Provider/Extender: Donalee Citrin in Treatment: 86 History of Present Illness HPI Description: 09/11/2020 ADMISSION This is a soon-to-be 87 year old man who was hospitalized in June and subsequently spent time at [place] home. At some point he developed a pressure ulcer on his left heel. On discharge he was seen by podiatry and referred to Dr. Donzetta Matters with regards to the possibility of PAD. The patient underwent angiography on 08/10/2020 this showed heavily calcified vessels. The common iliac artery on the left appeared to have calcific disease but there was no gradient. Left common external leg arteries were heavily tortuous left lower extremity limited angiography developed demonstrated a SFA to be helped heavily calcified throughout with multiple greater than 50% stenosis but never frankly occludes he was not felt to be a candidate for endovascular repair. I think they had a minimal look at the right side and it appeared that the SFA was occluded and reconstitutes above the knee. The overall feeling was that if he could not heal his left heel ulcer he will eventually best be served by a left above-knee amputation The last I can see is that he should have been using a Santyl wet-to-dry dressing to the heel. During his stay with podiatry was also noted that he had venous insufficiency and I think venous insufficiency wounds. His wife tells me that he has been recommended for compression stockings in the past but the patient refused, he has wounds on his bilateral anterior lower legs right greater than the  left. Chronic venous inflammation and dry flaking skin. Past medical history; heart failure, right heel wound, paroxysmal atrial fibrillation on Eliquis, abdominal aortic aneurysm with previous repair, COPD, DVT, melanoma on his back, diabetes with a recent hemoglobin A1c of 7.3 however he is not on any treatment [type II], PAD, coronary artery disease, hypertension The patient's most recent arterial studies were on 05/28/2020. This showed an ABI on the right at 1.12 but monophasic waveforms and absent waveforms at the posterior tibial artery. On the left his ABI was 1.47 [noncompressible] he had triphasic waveforms at the ATA and monophasic at the PTA. His TBI on the right was 1.12 on the left and 0.97. It was felt his ABIs were falsely elevated 9/24. The patient followed up with Dr. Donzetta Matters and had noninvasive arterial studies. On the right his ABIs were noncompressible monophasic waveforms with a TBI of 1.45 and a right great toe pressure of 172. On the left he had monophasic waveforms with noncompressible ABIs TBI of 0.79 and a left great toe pressure of 94. He will be seen in follow-up by Dr. Donzetta Matters in a month's time. The patient also has very significant chronic venous insufficiency 10/8; patient with known PAD. He came in with bilateral lower extremity wounds likely secondary to venous stasis. Still has one on the upper right tibial area. The area on the left leg is healed however he still has an area on the left heel. This needed an extensive debridement today. His wife brings in notes apparently Dr. March Rummage ordered an MRI of the left heel. I was unaware of this. I was not aware that he did not been seen by Dr. March Rummage and I do  not have any records of this. Apparently MRI called to set up the appointment. I will need to look through epic on this 10/15; this is a patient with known PAD who was revascularized. He also has venous stasis disease. The area that was on the right anterior tibia has  actually close down although he has a still a small area on the upper anterior tibia. 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 We have been using Iodoflex on this area and he is required debridements. We tried to get her to agree to an MRI that have been ordered by Dr. March Rummage. However she did not get back to the MRI people or they have not called her back. We will reorder this. He is quite tender around this wound and I wonder about osteomyelitis. 10/29; MRI. Fortunately this did not show any evidence of abscess or osteomyelitis in the left heel. Vein and vascular on 10/21; this is a patient who is previously had an angiogram. He had a heavily calcified aorta and iliac vessels as well as the SFA. He had multiple areas of diffuse 50% stenosis. No interventions were apparently done by Dr. Donzetta Matters he does have an occluded right SFA but with above-knee reconstitution. He was not felt to be a candidate for revascularization for his diffuse areas of greater than 50% stenosis. It was not felt that these were amenable to intervention Since he was last here he went to see dermatology. I believe he had skin cancer extractions from the right area anteriorly and 2 on the left medial. 11/12; left heel actually looks somewhat better but still requiring debridement. We've been using Iodoflex Right anterior mid tibia surgical extraction for a skin cancer. This also required debridement The area on the left lower medial leg looks a lot better and is just about fully epithelialized 12/3; pressure ulcer on the left heel in the setting of PAD. This looks better still requiring debridement we have been using Iodoflex Right anterior mid tibia surgical extraction for cancer this also continually requires debridement although the wound measures  smaller The area on the left lower medial leg had much more eschar on this than last time this is a deterioration. Notable for the fact that home health only wrap the leg halfway and actually had one of the wraps at the level of the wound on the right anterior. 12/17; the area on the left heel is just about closed. The area on the right anterior tibia looks much healthier and is smaller. The area on the left medial ankle I think is also smaller. We have been using Iodoflex to all areas under 3 layer compression bilaterally. He has home health 1/7; left heel is healed. He has an area on the right anterior tibia and the left medial lower leg. Significant chronic venous insufficiency he has home health 1/21; left heel remains healed which is his original wound. He is yet area on the right anterior tibia which is smaller and an area on the left medial lower leg just above the medial malleolus this also looks smaller. He has significant chronic venous insufficiency 2/4; left heel remains closed. He has an area on the right anterior and a left medial lower leg this is in the setting of chronic venous insufficiency. We have been using Hydrofera Blue  and 3 layer compression bilateral 2/18; patient with chronic venous insufficiency wounds on the right anterior and left medial lower leg the left medial lower leg is just about closed we have excellent edema control. Original pressure ulcer on his left heel remains closed. He arrives in clinic today with a thick mycotic nail on the left great toe split in the middle. There was apparently quite a bit of bleeding the other day on a "white carpet" Electronic Signature(s) Signed: 02/12/2021 5:25:02 PM By: Linton Ham MD Entered By: Linton Ham on 02/12/2021 13:35:59 -------------------------------------------------------------------------------- Avulsion of nail plate,partial/complete,single Details Patient Name: Date of Service: Andrew [name], [name]  02/12/2021 12:30 PM Medical Record Number: [id number] Patient Account Number: [id number] Date of Birth/Sex: Treating RN: 03/15/1928 (87 y.o. [name] Primary Care Provider: Dimas Chyle Other Clinician: Referring Provider: Treating Provider/Extender: Donalee Citrin in Treatment: 22 Procedure Performed for: Non-Wound Location Performed By: Physician Ricard Dillon., MD Post Procedure Diagnosis Same as Pre-procedure Notes left great toe using ronguer Electronic Signature(s) Signed: 02/12/2021 5:25:02 PM By: Linton Ham MD Signed: 02/12/2021 5:33:43 PM By: Baruch Gouty RN, BSN Entered By: Baruch Gouty on 02/12/2021 13:29:40 -------------------------------------------------------------------------------- Physical Exam Details Patient Name: Date of Service: Andrew [name], [name] 02/12/2021 12:30 PM Medical Record Number: [id number] Patient Account Number: [id number] Date of Birth/Sex: Treating RN: Apr 03, 1928 (87 y.o. [name] Primary Care Provider: Dimas Chyle Other Clinician: Referring Provider: Treating Provider/Extender: Donalee Citrin in Treatment: 22 Constitutional Sitting or standing Blood Pressure is within target range for patient.. Pulse regular and within target range for patient.Marland Kitchen Respirations regular, non-labored and within target range.. Temperature is normal and within the target range for the patient.Marland Kitchen Appears in no distress. Cardiovascular Dorsalis pedis pulses are palpable bilaterally. Edema control is excellent. Skin changes of hemosiderin deposition.. Notes Wound exam The area on the left medial calf is just about closed right anterior looks healthy. No debridement is required in these areas He arrived in clinic today on his left great toe with a partially avulses left great toe thick mycotic. I used rongeurs to remove this clean off the wound bed. The the nail was removed in total. Electronic  Signature(s) Signed: 02/12/2021 5:25:02 PM By: Linton Ham MD Entered By: Linton Ham on 02/12/2021 13:38:33 -------------------------------------------------------------------------------- Physician Orders Details Patient Name: Date of Service: Andrew [name], [name] 02/12/2021 12:30 PM Medical Record Number: [id number] Patient Account Number: [id number] Date of Birth/Sex: Treating RN: [birth year] (87 y.o. [name] Primary Care Provider: Dimas Chyle Other Clinician: Referring Provider: Treating Provider/Extender: Donalee Citrin in Treatment: 22 Verbal / Phone Orders: No Diagnosis Coding ICD-10 Coding Code Description E11.621 Type 2 diabetes mellitus with foot ulcer I87.333 Chronic venous hypertension (idiopathic) with ulcer and inflammation of bilateral lower extremity L97.811 Non-pressure chronic ulcer of other part of right lower leg limited to breakdown of skin L97.829 Non-pressure chronic ulcer of other part of left lower leg with unspecified severity T81.31XA Disruption of external operation (surgical) wound, not elsewhere classified, initial encounter Follow-up Appointments Return Appointment in 2 weeks. Bathing/ Shower/ Hygiene May shower with protection but do not get wound dressing(s) wet. Edema Control - Lymphedema / SCD / Other Bilateral Lower Extremities Elevate legs to the level of the heart or above for 30 minutes daily and/or when sitting, a frequency of: - throughout the day Avoid standing for long periods of time. Exercise regularly Non Wound Condition pply the following to affected area as  directed: - triple antibiotic ointment to left great toenail bed daily and cover with a bandaid A Home Health No change in wound care orders this week; continue Home Health for wound care. May utilize formulary equivalent dressing for wound treatment orders unless otherwise specified. Other Home Health Orders/Instructions: - Medi  Home Wound Treatment Wound #4 - Lower Leg Wound Laterality: Right, Anterior, Proximal Cleanser: Soap and Water 2 x Per Week/30 Days Discharge Instructions: May shower and wash wound with dial antibacterial soap and water prior to dressing change. Cleanser: Normal Saline 2 x Per Week/30 Days Discharge Instructions: Cleanse the wound with Normal Saline prior or wound cleanser to applying a clean dressing using gauze sponges, not tissue or cotton balls. Peri-Wound Care: Sween Lotion (Moisturizing lotion) 2 x Per Week/30 Days Discharge Instructions: Apply moisturizing lotion or equivalent moisterizing lotion to legs under wraps Prim Dressing: Hydrofera Blue Classic Foam, 2x2 in 2 x Per Week/30 Days ary Discharge Instructions: Moisten with saline prior to applying to wound bed Secondary Dressing: Woven Gauze Sponge, Non-Sterile 4x4 in 2 x Per Week/30 Days Discharge Instructions: Apply over primary dressing as directed. Compression Wrap: Kerlix Roll 4.5x3.1 (in/yd) 2 x Per Week/30 Days Discharge Instructions: Apply Kerlix and Coban compression , wrap from base of toes to knee Compression Wrap: Coban Self-Adherent Wrap 4x5 (in/yd) 2 x Per Week/30 Days Discharge Instructions: Apply over Kerlix as directed. Compression Stockings: Jobst Farrow Wrap 4000 (DME) Right Leg Compression Amount: 30-40 mmHG Discharge Instructions: Apply Francia Greaves daily as instructed. Apply first thing in the morning, remove at night before bed. Wound #5 - Lower Leg Wound Laterality: Left, Medial Cleanser: Soap and Water 2 x Per Week/30 Days Discharge Instructions: May shower and wash wound with dial antibacterial soap and water prior to dressing change. Cleanser: Normal Saline 2 x Per Week/30 Days Discharge Instructions: Cleanse the wound with Normal Saline prior or wound cleanser to applying a clean dressing using gauze sponges, not tissue or cotton balls. Peri-Wound Care: Sween Lotion (Moisturizing lotion) 2 x Per  Week/30 Days Discharge Instructions: Apply moisturizing lotion or equivalent moisterizing lotion to legs under wraps Prim Dressing: Hydrofera Blue Classic Foam, 2x2 in 2 x Per Week/30 Days ary Discharge Instructions: Moisten with saline prior to applying to wound bed Secondary Dressing: Woven Gauze Sponge, Non-Sterile 4x4 in 2 x Per Week/30 Days Discharge Instructions: Apply over primary dressing as directed. Compression Wrap: Kerlix Roll 4.5x3.1 (in/yd) 2 x Per Week/30 Days Discharge Instructions: Apply Kerlix and Coban compression , wrap from base of toes to knee Compression Wrap: Coban Self-Adherent Wrap 4x5 (in/yd) 2 x Per Week/30 Days Discharge Instructions: Apply over Kerlix as directed. Compression Stockings: Jobst Farrow Wrap 4000 (DME) Left Leg Compression Amount: 30-40 mmHG Discharge Instructions: Apply Francia Greaves daily as instructed. Apply first thing in the morning, remove at night before bed. Electronic Signature(s) Signed: 02/12/2021 5:25:02 PM By: Linton Ham MD Signed: 02/12/2021 5:33:43 PM By: Baruch Gouty RN, BSN Entered By: Baruch Gouty on 02/12/2021 13:43:07 -------------------------------------------------------------------------------- Problem List Details Patient Name: Date of Service: Andrew [name], [name] 02/12/2021 12:30 PM Medical Record Number: [id number] Patient Account Number: [id number] Date of Birth/Sex: Treating RN: 12/09/1928 (87 y.o. [name] Primary Care Provider: Dimas Chyle Other Clinician: Referring Provider: Treating Provider/Extender: Donalee Citrin in Treatment: 22 Active Problems ICD-10 Encounter Code Description Active Date MDM Diagnosis E11.621 Type 2 diabetes mellitus with foot ulcer 09/11/2020 No Yes I87.333 Chronic venous hypertension (idiopathic) with ulcer and inflammation of 09/11/2020  No Yes bilateral lower extremity L97.811 Non-pressure chronic ulcer of other part of right lower leg  limited to breakdown 09/11/2020 No Yes of skin L97.829 Non-pressure chronic ulcer of other part of left lower leg with unspecified 10/23/2020 No Yes severity T81.31XA Disruption of external operation (surgical) wound, not elsewhere classified, 10/23/2020 No Yes initial encounter L60.1 Onycholysis 02/12/2021 No Yes Inactive Problems ICD-10 Code Description Active Date Inactive Date L97.422 Non-pressure chronic ulcer of left heel and midfoot with fat layer exposed 09/11/2020 09/11/2020 L97.821 Non-pressure chronic ulcer of other part of left lower leg limited to breakdown of skin 09/11/2020 09/11/2020 L97.818 Non-pressure chronic ulcer of other part of right lower leg with other specified severity 10/23/2020 10/23/2020 Resolved Problems Electronic Signature(s) Signed: 02/12/2021 5:25:02 PM By: Linton Ham MD Entered By: Linton Ham on 02/12/2021 13:34:45 -------------------------------------------------------------------------------- Progress Note Details Patient Name: Date of Service: Andrew [name], [name] 02/12/2021 12:30 PM Medical Record Number: [id number] Patient Account Number: [id number] Date of Birth/Sex: Treating RN: 02/13/1928 (87 y.o. [name] Primary Care Provider: Dimas Chyle Other Clinician: Referring Provider: Treating Provider/Extender: Donalee Citrin in Treatment: 22 Subjective History of Present Illness (HPI) 09/11/2020 ADMISSION This is a soon-to-be 87 year old man who was hospitalized in June and subsequently spent time at [place] home. At some point he developed a pressure ulcer on his left heel. On discharge he was seen by podiatry and referred to Dr. Donzetta Matters with regards to the possibility of PAD. The patient underwent angiography on 08/10/2020 this showed heavily calcified vessels. The common iliac artery on the left appeared to have calcific disease but there was no gradient. Left common external leg arteries were heavily  tortuous left lower extremity limited angiography developed demonstrated a SFA to be helped heavily calcified throughout with multiple greater than 50% stenosis but never frankly occludes he was not felt to be a candidate for endovascular repair. I think they had a minimal look at the right side and it appeared that the SFA was occluded and reconstitutes above the knee. The overall feeling was that if he could not heal his left heel ulcer he will eventually best be served by a left above-knee amputation The last I can see is that he should have been using a Santyl wet-to-dry dressing to the heel. During his stay with podiatry was also noted that he had venous insufficiency and I think venous insufficiency wounds. His wife tells me that he has been recommended for compression stockings in the past but the patient refused, he has wounds on his bilateral anterior lower legs right greater than the left. Chronic venous inflammation and dry flaking skin. Past medical history; heart failure, right heel wound, paroxysmal atrial fibrillation on Eliquis, abdominal aortic aneurysm with previous repair, COPD, DVT, melanoma on his back, diabetes with a recent hemoglobin A1c of 7.3 however he is not on any treatment [type II], PAD, coronary artery disease, hypertension The patient's most recent arterial studies were on 05/28/2020. This showed an ABI on the right at 1.12 but monophasic waveforms and absent waveforms at the posterior tibial artery. On the left his ABI was 1.47 [noncompressible] he had triphasic waveforms at the ATA and monophasic at the PTA. His TBI on the right was 1.12 on the left and 0.97. It was felt his ABIs were falsely elevated 9/24. The patient followed up with Dr. Donzetta Matters and had noninvasive arterial studies. On the right his ABIs were noncompressible monophasic waveforms with a TBI of 1.45 and a right great  toe pressure of 172. On the left he had monophasic waveforms with noncompressible ABIs TBI  of 0.79 and a left great toe pressure of 94. He will be seen in follow-up by Dr. Donzetta Matters in a month's time. The patient also has very significant chronic venous insufficiency 10/8; patient with known PAD. He came in with bilateral lower extremity wounds likely secondary to venous stasis. Still has one on the upper right tibial area. The area on the left leg is healed however he still has an area on the left heel. This needed an extensive debridement today. His wife brings in notes apparently Dr. March Rummage ordered an MRI of the left heel. I was unaware of this. I was not aware that he did not been seen by Dr. March Rummage and I do not have any records of this. Apparently MRI called to set up the appointment. I will need to look through epic on this 10/15; this is a patient with known PAD who was revascularized. He also has venous stasis disease. The area that was on the right anterior tibia has actually close down although he has a still a small area on the upper anterior tibia. 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 We have been using Iodoflex on this area and he is required debridements. We tried to get her to agree to an MRI that have been ordered by Dr. March Rummage. However she did not get back to the MRI people or they have not called her back. We will reorder this. He is quite tender around this wound and I wonder about osteomyelitis. 10/29; MRI. Fortunately this did not show any evidence of abscess or osteomyelitis in the left heel. Vein and vascular on 10/21; this is a patient who is previously had an angiogram. He had a heavily calcified aorta and iliac vessels as well as the SFA. He had multiple areas of diffuse 50% stenosis. No interventions were apparently done by Dr. Donzetta Matters he does have an occluded right SFA but with  above-knee reconstitution. He was not felt to be a candidate for revascularization for his diffuse areas of greater than 50% stenosis. It was not felt that these were amenable to intervention Since he was last here he went to see dermatology. I believe he had skin cancer extractions from the right area anteriorly and 2 on the left medial. 11/12; left heel actually looks somewhat better but still requiring debridement. We've been using Iodoflex ooRight anterior mid tibia surgical extraction for a skin cancer. This also required debridement ooThe area on the left lower medial leg looks a lot better and is just about fully epithelialized 12/3; pressure ulcer on the left heel in the setting of PAD. This looks better still requiring debridement we have been using Iodoflex ooRight anterior mid tibia surgical extraction for cancer this also continually requires debridement although the wound measures smaller ooThe area on the left lower medial leg had much more eschar on this than last time this is a deterioration. ooNotable for the fact that home health only wrap the leg halfway and actually had one of the wraps at the level of the wound on the right anterior. 12/17; the area on the left heel is just about closed. The area on the right anterior tibia looks much healthier and is smaller. The area on  the left medial ankle I think is also smaller. We have been using Iodoflex to all areas under 3 layer compression bilaterally. He has home health 1/7; left heel is healed. He has an area on the right anterior tibia and the left medial lower leg. Significant chronic venous insufficiency he has home health 1/21; left heel remains healed which is his original wound. He is yet area on the right anterior tibia which is smaller and an area on the left medial lower leg just above the medial malleolus this also looks smaller. He has significant chronic venous insufficiency 2/4; left heel remains closed. He has an  area on the right anterior and a left medial lower leg this is in the setting of chronic venous insufficiency. We have been using Hydrofera Blue and 3 layer compression bilateral 2/18; patient with chronic venous insufficiency wounds on the right anterior and left medial lower leg the left medial lower leg is just about closed we have excellent edema control. Original pressure ulcer on his left heel remains closed. He arrives in clinic today with a thick mycotic nail on the left great toe split in the middle. There was apparently quite a bit of bleeding the other day on a "white carpet" Objective Constitutional Sitting or standing Blood Pressure is within target range for patient.. Pulse regular and within target range for patient.Marland Kitchen Respirations regular, non-labored and within target range.. Temperature is normal and within the target range for the patient.Marland Kitchen Appears in no distress. Vitals Time Taken: 12:53 PM, Height: 67 in, Weight: 185 lbs, BMI: 29, Temperature: 98 F, Pulse: 48 bpm, Respiratory Rate: 17 breaths/min, Blood Pressure: 108/49 mmHg. Cardiovascular Dorsalis pedis pulses are palpable bilaterally. Edema control is excellent. Skin changes of hemosiderin deposition.. General Notes: Wound exam ooThe area on the left medial calf is just about closed right anterior looks healthy. No debridement is required in these areas [facility] arrived in clinic today on his left great toe with a partially avulses left great toe thick mycotic. I used rongeurs to remove this clean off the wound bed. The the nail was removed in total. Integumentary (Hair, Skin) Wound #4 status is Open. Original cause of wound was Gradually Appeared. The wound is located on the Right,Proximal,Anterior Lower Leg. The wound measures 1cm length x 0.5cm width x 0.1cm depth; 0.393cm^2 area and 0.039cm^3 volume. There is Fat Layer (Subcutaneous Tissue) exposed. There is no tunneling or undermining noted. There is a small amount of  serosanguineous drainage noted. The wound margin is flat and intact. There is large (67-100%) red granulation within the wound bed. There is no necrotic tissue within the wound bed. Wound #5 status is Open. Original cause of wound was Gradually Appeared. The wound is located on the Left,Medial Lower Leg. The wound measures 0.2cm length x 0.2cm width x 0.1cm depth; 0.031cm^2 area and 0.003cm^3 volume. There is Fat Layer (Subcutaneous Tissue) exposed. There is no tunneling or undermining noted. There is a small amount of serosanguineous drainage noted. The wound margin is distinct with the outline attached to the wound base. There is large (67-100%) red, hyper - granulation within the wound bed. There is no necrotic tissue within the wound bed. 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 by Ricard Dillon., MD. Post procedure Diagnosis Wound #: Same as Pre-Procedure Notes: left great toe using ronguer Plan Follow-up Appointments: Return Appointment in 2 weeks. Bathing/ Shower/ Hygiene: May shower with protection but do not get wound dressing(s) wet. Edema Control - Lymphedema / SCD / Other: Elevate legs to the level of the heart or above for 30 minutes daily and/or when sitting, a frequency of: - throughout the day Avoid standing for long periods of time. Exercise regularly Non Wound Condition: Apply the following to affected area as directed: - triple antibiotic ointment to left great toenail bed  daily and cover with a bandaid Home Health: No change in wound care orders this week; continue [place] for wound care. May utilize formulary equivalent dressing for wound treatment orders unless otherwise specified. Other Home Health Orders/Instructions: - Medi Home WOUND #4: - Lower Leg Wound Laterality: Right, Anterior, Proximal Cleanser: Soap and Water 2 x Per Week/30 Days Discharge Instructions: May shower and wash wound with dial antibacterial soap and water prior to dressing change. Cleanser: Normal Saline 2 x Per Week/30 Days Discharge Instructions: Cleanse the wound with Normal Saline prior or wound cleanser to applying a clean dressing using gauze sponges, not tissue or cotton balls. Peri-Wound Care: Sween Lotion (Moisturizing lotion) 2 x Per Week/30 Days Discharge Instructions: Apply moisturizing lotion or equivalent moisterizing lotion to legs under wraps Prim Dressing: Hydrofera Blue Classic Foam, 2x2 in 2 x Per Week/30 Days ary Discharge Instructions: Moisten with saline prior to applying to wound bed Secondary Dressing: Woven Gauze Sponge, Non-Sterile 4x4 in 2 x Per Week/30 Days Discharge Instructions: Apply over primary dressing as directed. Com pression Wrap: Kerlix Roll 4.5x3.1 (in/yd) 2 x Per Week/30 Days Discharge Instructions: Apply Kerlix and Coban compression , wrap from base of toes to knee Com pression Wrap: Coban Self-Adherent Wrap 4x5 (in/yd) 2 x Per Week/30 Days Discharge Instructions: Apply over Kerlix as directed. WOUND #5: - Lower Leg Wound Laterality: Left, Medial Cleanser: Soap and Water 2 x Per Week/30 Days Discharge Instructions: May shower and wash wound with dial antibacterial soap and water prior to dressing change. Cleanser: Normal Saline 2 x Per Week/30 Days Discharge Instructions: Cleanse the wound with Normal Saline prior or wound cleanser to applying a clean dressing using gauze sponges, not tissue or cotton balls. Peri-Wound Care: Sween  Lotion (Moisturizing lotion) 2 x Per Week/30 Days Discharge Instructions: Apply moisturizing lotion or equivalent moisterizing lotion to legs under wraps Prim Dressing: Hydrofera Blue Classic Foam, 2x2 in 2 x Per Week/30 Days ary Discharge Instructions: Moisten with saline prior to applying to wound bed Secondary Dressing: Woven Gauze Sponge, Non-Sterile 4x4 in 2 x Per Week/30 Days Discharge Instructions: Apply over primary dressing as directed. Com pression Wrap: Kerlix Roll 4.5x3.1 (in/yd) 2 x Per Week/30 Days Discharge Instructions: Apply Kerlix and Coban compression , wrap from base of toes to knee Com pression Wrap: Coban Self-Adherent Wrap 4x5 (in/yd) 2 x Per Week/30 Days Discharge Instructions: Apply over Kerlix as directed. 1. Continue with Hydrofera Blue under compression for the wounds on his bilateral lower legs which are chronic venous wounds 2. His original wound on the left heel is healed 3. I remove the thick mycotic nail on the left first toe which had split down the middle. They are to apply topical antibiotics like Neosporin or Polysporin to the nailbed and put a Band-Aid around  this daily Electronic Signature(s) Signed: 02/12/2021 5:25:02 PM By: Linton Ham MD Entered By: Linton Ham on 02/12/2021 13:39:38 -------------------------------------------------------------------------------- SuperBill Details Patient Name: Date of Service: Andrew [name], [name] 02/12/2021 Medical Record Number: [id number] Patient Account Number: [id number] Date of Birth/Sex: Treating RN: 03/09/1928 (87 y.o. [name] Primary Care Provider: Dimas Chyle Other Clinician: Referring Provider: Treating Provider/Extender: Donalee Citrin in Treatment: 22 Diagnosis Coding ICD-10 Codes Code Description E11.621 Type 2 diabetes mellitus with foot ulcer I87.333 Chronic venous hypertension (idiopathic) with ulcer and inflammation of bilateral lower  extremity L97.811 Non-pressure chronic ulcer of other part of right lower leg limited to breakdown of skin L97.829 Non-pressure chronic ulcer of other part of left lower leg with unspecified severity T81.31XA Disruption of external operation (surgical) wound, not elsewhere classified, initial encounter Facility Procedures CPT4 Code: 41937902 Description: 40973 - AVULSION NAIL, SIMPLE/SINGLE ICD-10 Diagnosis Description E11.621 Type 2 diabetes mellitus with foot ulcer Modifier: Quantity: 1 Physician Procedures : CPT4 Code Description Modifier [id number] - WC PHYS AVULSION NAILPLATE SIM/SING HDQ-22 Diagnosis Description E11.621 Type 2 diabetes mellitus with foot ulcer Quantity: 1 Electronic Signature(s) Signed: 02/12/2021 5:25:02 PM By: Linton Ham MD Entered By: Linton Ham on 02/12/2021 13:40:07

## 2021-02-12 NOTE — Progress Notes (Signed)
VALERIAN, JEWEL ([id number]) Visit Report for 02/12/2021 Arrival Information Details Patient Name: Date of Service: Andrew [name], GEO [facility] A. 02/12/2021 12:30 PM Medical Record Number: [id number] Patient Account Number: [id number] Date of Birth/Sex: Treating RN: 1928/08/20 (87 y.o. Burnadette Pop, Lauren Primary Care Euel Castile: Dimas Chyle Other Clinician: Referring Vidyuth Belsito: Treating Dennie Vecchio/Extender: Donalee Citrin in Treatment: 81 Visit Information History Since Last Visit Added or deleted any medications: No Patient Arrived: Wheel Chair Any new allergies or adverse reactions: No Arrival Time: 12:53 Had a fall or experienced change in No Accompanied By: wife activities of daily living that may affect Transfer Assistance: None risk of falls: Patient Identification Verified: Yes Signs or symptoms of abuse/neglect since last visito No Secondary Verification Process Completed: Yes Hospitalized since last visit: No Patient Requires Transmission-Based Precautions: No Implantable device outside of the clinic excluding No Patient Has Alerts: No cellular tissue based products placed in the center since last visit: Has Dressing in Place as Prescribed: Yes Pain Present Now: No Electronic Signature(s) Signed: 02/12/2021 5:09:47 PM By: Rhae Hammock RN Entered By: Rhae Hammock on 02/12/2021 12:53:22 -------------------------------------------------------------------------------- Encounter Discharge Information Details Patient Name: Date of Service: Andrew [name], [name] 02/12/2021 12:30 PM Medical Record Number: [id number] Patient Account Number: [id number] Date of Birth/Sex: Treating RN: Sep 11, 1928 (87 y.o. [name] Primary Care Jakorey Mcconathy: Dimas Chyle Other Clinician: Referring Gerene Nedd: Treating Aristotelis Vilardi/Extender: Donalee Citrin in Treatment: 4 Encounter Discharge Information Items Discharge Condition: Stable Ambulatory  Status: Wheelchair Discharge Destination: Home Transportation: Private Auto Accompanied By: wife Schedule Follow-up Appointment: Yes Clinical Summary of Care: Electronic Signature(s) Signed: 02/12/2021 5:04:58 PM By: Deon Pilling Entered By: Deon Pilling on 02/12/2021 14:05:02 -------------------------------------------------------------------------------- Lower Extremity Assessment Details Patient Name: Date of Service: Andrew [name], [name] 02/12/2021 12:30 PM Medical Record Number: [id number] Patient Account Number: [id number] Date of Birth/Sex: Treating RN: Nov 11, 1928 (87 y.o. Burnadette Pop, Lauren Primary Care Tajay Muzzy: Dimas Chyle Other Clinician: Referring Almer Bushey: Treating Charles Niese/Extender: Donalee Citrin in Treatment: 22 Edema Assessment Assessed: Shirlyn Goltz: Yes] Patrice Paradise: Yes] Edema: [Left: No] [Right: No] Calf Left: Right: Point of Measurement: 44 cm From Medial Instep 30 cm 30 cm Ankle Left: Right: Point of Measurement: 10 cm From Medial Instep 20 cm 20.5 cm Knee To Floor Left: Right: From Medial Instep 45 cm 45 cm Vascular Assessment Pulses: Dorsalis Pedis Palpable: [Left:Yes] [Right:Yes] Posterior Tibial Palpable: [Left:Yes] [Right:Yes] Electronic Signature(s) Signed: 02/12/2021 5:09:47 PM By: Rhae Hammock RN Signed: 02/12/2021 5:33:43 PM By: Baruch Gouty RN, BSN Entered By: Baruch Gouty on 02/12/2021 13:32:13 -------------------------------------------------------------------------------- Multi Wound Chart Details Patient Name: Date of Service: Andrew [name], [name] 02/12/2021 12:30 PM Medical Record Number: [id number] Patient Account Number: [id number] Date of Birth/Sex: Treating RN: 1928/01/12 (87 y.o. [name] Primary Care Jomes Giraldo: Dimas Chyle Other Clinician: Referring Arantxa Piercey: Treating Daris Aristizabal/Extender: Donalee Citrin in Treatment: 22 Vital Signs Height(in): 22 Pulse(bpm):  48 Weight(lbs): 185 Blood Pressure(mmHg): 108/49 Body Mass Index(BMI): 29 Temperature(F): 98 Respiratory Rate(breaths/min): 17 Photos: [4:No Photos Right, Proximal, Anterior Lower Leg] [5:No Photos Left, Medial Lower Leg] [N/A:N/A N/A] Wound Location: [4:Gradually Appeared] [5:Gradually Appeared] [N/A:N/A] Wounding Event: [4:Venous Leg Ulcer] [5:Diabetic Wound/Ulcer of the Lower] [N/A:N/A] Primary Etiology: [4:Congestive Heart Failure, Coronary Congestive Heart Failure, Coronary N/A] [5:Extremity] Comorbid History: [4:Artery Disease, Hypertension, Peripheral Arterial Disease, Peripheral Peripheral Arterial Disease, Peripheral Venous Disease, Type II Diabetes 10/09/2020] [5:Artery Disease, Hypertension, Venous Disease, Type II Diabetes 10/23/2020]  [N/A:N/A] Date Acquired: [4:18] [5:16] [N/A:N/A] Weeks of  Treatment: [4:Open] [5:Open] [N/A:N/A] Wound Status: [4:No] [5:Yes] [N/A:N/A] Clustered Wound: [4:1x0.5x0.1] [5:0.2x0.2x0.1] [N/A:N/A] Measurements L x W x D (cm) [4:0.393] [5:0.031] [N/A:N/A] A (cm) : rea [4:0.039] [5:0.003] [N/A:N/A] Volume (cm) : [4:-318.10%] [5:91.80%] [N/A:N/A] % Reduction in Area: [4:-333.30%] [5:92.10%] [N/A:N/A] % Reduction in Volume: [4:Full Thickness Without Exposed] [5:Grade 2] [N/A:N/A] Classification: [4:Support Structures Small] [5:Small] [N/A:N/A] Exudate Amount: [4:Serosanguineous] [5:Serosanguineous] [N/A:N/A] Exudate Type: [4:red, brown] [5:red, brown] [N/A:N/A] Exudate Color: [4:Flat and Intact] [5:Distinct, outline attached] [N/A:N/A] Wound Margin: [4:Large (67-100%)] [5:Large (67-100%)] [N/A:N/A] Granulation Amount: [4:Red] [5:Red, Hyper-granulation] [N/A:N/A] Granulation Quality: [4:None Present (0%)] [5:None Present (0%)] [N/A:N/A] Necrotic Amount: [4:Fat Layer (Subcutaneous Tissue): Yes Fat Layer (Subcutaneous Tissue): Yes N/A] Exposed Structures: [4:Fascia: No Tendon: No Muscle: No Joint: No Bone: No Medium (34-66%)] [5:Fascia: No Tendon:  No Muscle: No Joint: No Bone: No Large (67-100%)] [N/A:N/A] Treatment Notes Electronic Signature(s) Signed: 02/12/2021 5:25:02 PM By: Linton Ham MD Signed: 02/12/2021 5:33:43 PM By: Baruch Gouty RN, BSN Entered By: Linton Ham on 02/12/2021 13:34:52 -------------------------------------------------------------------------------- Multi-Disciplinary Care Plan Details Patient Name: Date of Service: Andrew [name], [name] 02/12/2021 12:30 PM Medical Record Number: [id number] Patient Account Number: [id number] Date of Birth/Sex: Treating RN: 1928/06/08 (87 y.o. [name] Primary Care Talonda Artist: Dimas Chyle Other Clinician: Referring Christiann Hagerty: Treating Nayel Purdy/Extender: Donalee Citrin in Treatment: 22 Active Inactive Wound/Skin Impairment Nursing Diagnoses: Impaired tissue integrity Goals: Patient/caregiver will verbalize understanding of skin care regimen Date Initiated: 10/23/2020 Target Resolution Date: 02/26/2021 Goal Status: Active Ulcer/skin breakdown will have a volume reduction of 50% by week 8 Date Initiated: 09/11/2020 Date Inactivated: 10/23/2020 Target Resolution Date: 10/16/2020 Goal Status: Unmet Unmet Reason: PAD Interventions: Provide education on ulcer and skin care Notes: Electronic Signature(s) Signed: 02/12/2021 5:33:43 PM By: Baruch Gouty RN, BSN Entered By: Baruch Gouty on 02/12/2021 13:19:16 -------------------------------------------------------------------------------- Pain Assessment Details Patient Name: Date of Service: Andrew [name], [name] 02/12/2021 12:30 PM Medical Record Number: [id number] Patient Account Number: [id number] Date of Birth/Sex: Treating RN: June 12, 1928 (87 y.o. Burnadette Pop, Lauren Primary Care [name] Howerter: Dimas Chyle Other Clinician: Referring Thao Bauza: Treating Ustin Cruickshank/Extender: Donalee Citrin in Treatment: 22 Active Problems Location of Pain Severity and  Description of Pain Patient Has Paino No Site Locations Pain Management and Medication Current Pain Management: Electronic Signature(s) Signed: 02/12/2021 5:09:47 PM By: Rhae Hammock RN Entered By: Rhae Hammock on 02/12/2021 12:59:20 -------------------------------------------------------------------------------- Patient/Caregiver Education Details Patient Name: Date of Service: Andrew [name], [name] 2/18/2022andnbsp12:30 PM Medical Record Number: [id number] Patient Account Number: [id number] Date of Birth/Gender: Treating RN: 1928/05/25 (87 y.o. [name] Primary Care Physician: Dimas Chyle Other Clinician: Referring Physician: Treating Physician/Extender: Donalee Citrin in Treatment: 22 Education Assessment Education Provided To: Patient Education Topics Provided Venous: Methods: Explain/Verbal Responses: Reinforcements needed, State content correctly Wound/Skin Impairment: Methods: Explain/Verbal Responses: Reinforcements needed, State content correctly Electronic Signature(s) Signed: 02/12/2021 5:33:43 PM By: Baruch Gouty RN, BSN Entered By: Baruch Gouty on 02/12/2021 13:25:28 -------------------------------------------------------------------------------- Wound Assessment Details Patient Name: Date of Service: Andrew [name], [name] 02/12/2021 12:30 PM Medical Record Number: [id number] Patient Account Number: [id number] Date of Birth/Sex: Treating RN: 06-06-1928 (87 y.o. Erie Noe Primary Care Logen Heintzelman: Dimas Chyle Other Clinician: Referring Philomene Haff: Treating Verlie Hellenbrand/Extender: Donalee Citrin in Treatment: 22 Wound Status Wound Number: 4 Primary Venous Leg Ulcer Etiology: Wound Location: Right, Proximal, Anterior Lower Leg Wound Open Wounding Event: Gradually Appeared Status: Date Acquired: 10/09/2020 Comorbid Congestive Heart Failure, Coronary Artery Disease, Hypertension, Weeks  Of Treatment: 18 History: Peripheral Arterial Disease, Peripheral Venous Disease, Type II Clustered Wound: No Diabetes Wound Measurements Length: (cm) 1 Width: (cm) 0.5 Depth: (cm) 0.1 Area: (cm) 0.393 Volume: (cm) 0.039 % Reduction in Area: -318.1% % Reduction in Volume: -333.3% Epithelialization: Medium (34-66%) Tunneling: No Undermining: No Wound Description Classification: Full Thickness Without Exposed Support Structures Wound Margin: Flat and Intact Exudate Amount: Small Exudate Type: Serosanguineous Exudate Color: red, brown Foul Odor After Cleansing: No Slough/Fibrino No Wound Bed Granulation Amount: Large (67-100%) Exposed Structure Granulation Quality: Red Fascia Exposed: No Necrotic Amount: None Present (0%) Fat Layer (Subcutaneous Tissue) Exposed: Yes Tendon Exposed: No Muscle Exposed: No Joint Exposed: No Bone Exposed: No Treatment Notes Wound #4 (Lower Leg) Wound Laterality: Right, Anterior, Proximal Cleanser Soap and Water Discharge Instruction: May shower and wash wound with dial antibacterial soap and water prior to dressing change. Normal Saline Discharge Instruction: Cleanse the wound with Normal Saline prior or wound cleanser to applying a clean dressing using gauze sponges, not tissue or cotton balls. Peri-Wound Care Sween Lotion (Moisturizing lotion) Discharge Instruction: Apply moisturizing lotion or equivalent moisterizing lotion to legs under wraps Topical Primary Dressing Hydrofera Blue Classic Foam, 2x2 in Discharge Instruction: Moisten with saline prior to applying to wound bed Secondary Dressing Woven Gauze Sponge, Non-Sterile 4x4 in Discharge Instruction: Apply over primary dressing as directed. Secured With Compression Wrap Kerlix Roll 4.5x3.1 (in/yd) Discharge Instruction: Apply Kerlix and Coban compression , wrap from base of toes to knee Coban Self-Adherent Wrap 4x5 (in/yd) Discharge Instruction: Apply over Kerlix as  directed. Compression Stockings Jobst Farrow Wrap 4000 Quantity: 1 Right Leg Compression Amount: 30-40 mmHg Discharge Instruction: Apply Francia Greaves daily as instructed. Apply first thing in the morning, remove at night before bed. Add-Ons Electronic Signature(s) Signed: 02/12/2021 5:09:47 PM By: Rhae Hammock RN Entered By: Rhae Hammock on 02/12/2021 13:08:18 -------------------------------------------------------------------------------- Wound Assessment Details Patient Name: Date of Service: Andrew [name], [name] 02/12/2021 12:30 PM Medical Record Number: [id number] Patient Account Number: [id number] Date of Birth/Sex: Treating RN: 19-Jun-1928 (87 y.o. Burnadette Pop, Lauren Primary Care Carmisha Larusso: Dimas Chyle Other Clinician: Referring Cindie Rajagopalan: Treating Delon Revelo/Extender: Donalee Citrin in Treatment: 22 Wound Status Wound Number: 5 Primary Diabetic Wound/Ulcer of the Lower Extremity Etiology: Wound Location: Left, Medial Lower Leg Wound Open Wounding Event: Gradually Appeared Status: Date Acquired: 10/23/2020 Comorbid Congestive Heart Failure, Coronary Artery Disease, Hypertension, Weeks Of Treatment: 16 History: Peripheral Arterial Disease, Peripheral Venous Disease, Type II Clustered Wound: Yes Diabetes Wound Measurements Length: (cm) 0.2 Width: (cm) 0.2 Depth: (cm) 0.1 Area: (cm) 0.031 Volume: (cm) 0.003 % Reduction in Area: 91.8% % Reduction in Volume: 92.1% Epithelialization: Large (67-100%) Tunneling: No Undermining: No Wound Description Classification: Grade 2 Wound Margin: Distinct, outline attached Exudate Amount: Small Exudate Type: Serosanguineous Exudate Color: red, brown Foul Odor After Cleansing: No Slough/Fibrino No Wound Bed Granulation Amount: Large (67-100%) Exposed Structure Granulation Quality: Red, Hyper-granulation Fascia Exposed: No Necrotic Amount: None Present (0%) Fat Layer (Subcutaneous Tissue)  Exposed: Yes Tendon Exposed: No Muscle Exposed: No Joint Exposed: No Bone Exposed: No Treatment Notes Wound #5 (Lower Leg) Wound Laterality: Left, Medial Cleanser Soap and Water Discharge Instruction: May shower and wash wound with dial antibacterial soap and water prior to dressing change. Normal Saline Discharge Instruction: Cleanse the wound with Normal Saline prior or wound cleanser to applying a clean dressing using gauze sponges, not tissue or cotton balls. Peri-Wound Care Sween Lotion (Moisturizing lotion) Discharge Instruction: Apply moisturizing lotion or equivalent moisterizing  lotion to legs under wraps Topical Primary Dressing Hydrofera Blue Classic Foam, 2x2 in Discharge Instruction: Moisten with saline prior to applying to wound bed Secondary Dressing Woven Gauze Sponge, Non-Sterile 4x4 in Discharge Instruction: Apply over primary dressing as directed. Secured With Compression Wrap Kerlix Roll 4.5x3.1 (in/yd) Discharge Instruction: Apply Kerlix and Coban compression , wrap from base of toes to knee Coban Self-Adherent Wrap 4x5 (in/yd) Discharge Instruction: Apply over Kerlix as directed. Compression Stockings Jobst Farrow Wrap 4000 Quantity: 1 Left Leg Compression Amount: 30-40 mmHg Discharge Instruction: Apply Francia Greaves daily as instructed. Apply first thing in the morning, remove at night before bed. Add-Ons Electronic Signature(s) Signed: 02/12/2021 5:09:47 PM By: Rhae Hammock RN Entered By: Rhae Hammock on 02/12/2021 13:08:40 -------------------------------------------------------------------------------- Vitals Details Patient Name: Date of Service: Andrew [name], [name] 02/12/2021 12:30 PM Medical Record Number: [id number] Patient Account Number: [id number] Date of Birth/Sex: Treating RN: 04-08-1928 (87 y.o. Burnadette Pop, Lauren Primary Care Cordie Buening: Dimas Chyle Other Clinician: Referring Tniyah Nakagawa: Treating Maxwel Meadowcroft/Extender: Donalee Citrin in Treatment: 22 Vital Signs Time Taken: 12:53 Temperature (F): 98 Height (in): 67 Pulse (bpm): 48 Weight (lbs): 185 Respiratory Rate (breaths/min): 17 Body Mass Index (BMI): 29 Blood Pressure (mmHg): 108/49 Reference Range: 80 - 120 mg / dl Electronic Signature(s) Signed: 02/12/2021 5:09:47 PM By: Rhae Hammock RN Entered By: Rhae Hammock on 02/12/2021 12:59:14

## 2021-02-15 DIAGNOSIS — I130 Hypertensive heart and chronic kidney disease with heart failure and stage 1 through stage 4 chronic kidney disease, or unspecified chronic kidney disease: Secondary | ICD-10-CM | POA: Diagnosis not present

## 2021-02-15 DIAGNOSIS — N184 Chronic kidney disease, stage 4 (severe): Secondary | ICD-10-CM | POA: Diagnosis not present

## 2021-02-15 DIAGNOSIS — E1151 Type 2 diabetes mellitus with diabetic peripheral angiopathy without gangrene: Secondary | ICD-10-CM | POA: Diagnosis not present

## 2021-02-15 DIAGNOSIS — I872 Venous insufficiency (chronic) (peripheral): Secondary | ICD-10-CM | POA: Diagnosis not present

## 2021-02-15 DIAGNOSIS — I255 Ischemic cardiomyopathy: Secondary | ICD-10-CM | POA: Diagnosis not present

## 2021-02-15 DIAGNOSIS — S81801D Unspecified open wound, right lower leg, subsequent encounter: Secondary | ICD-10-CM | POA: Diagnosis not present

## 2021-02-15 DIAGNOSIS — I480 Paroxysmal atrial fibrillation: Secondary | ICD-10-CM | POA: Diagnosis not present

## 2021-02-15 DIAGNOSIS — E1122 Type 2 diabetes mellitus with diabetic chronic kidney disease: Secondary | ICD-10-CM | POA: Diagnosis not present

## 2021-02-15 DIAGNOSIS — J449 Chronic obstructive pulmonary disease, unspecified: Secondary | ICD-10-CM | POA: Diagnosis not present

## 2021-02-16 DIAGNOSIS — J9602 Acute respiratory failure with hypercapnia: Secondary | ICD-10-CM | POA: Diagnosis not present

## 2021-02-16 DIAGNOSIS — I5022 Chronic systolic (congestive) heart failure: Secondary | ICD-10-CM | POA: Diagnosis not present

## 2021-02-16 DIAGNOSIS — J449 Chronic obstructive pulmonary disease, unspecified: Secondary | ICD-10-CM | POA: Diagnosis not present

## 2021-02-16 DIAGNOSIS — J9601 Acute respiratory failure with hypoxia: Secondary | ICD-10-CM | POA: Diagnosis not present

## 2021-02-17 ENCOUNTER — Other Ambulatory Visit (HOSPITAL_COMMUNITY): Payer: Self-pay

## 2021-02-17 NOTE — Progress Notes (Addendum)
Paramedicine Encounter    Patient ID: Andrew [name], male    DOB: December 25, 1928, 87 y.o.   MRN: [id number]   Patient Care Team: [name], MD as PCP - General (Family Medicine) Larey Dresser, MD as PCP - Advanced Heart Failure (Cardiology) Larey Dresser, MD as Consulting Physician (Cardiology) Jorge Ny, LCSW as Social Worker (Licensed Clinical Social Worker) Gardiner Barefoot, DPM as Consulting Physician (Podiatry) [name], MD (Family Medicine) Patient, No Pcp Per (General Practice)  Patient Active Problem List   Diagnosis Date Noted  . Acute respiratory failure with hypoxia ([place]) 02/20/2021  . COPD (chronic obstructive pulmonary disease) ([place]) 02/20/2021  . History of CVA (cerebrovascular accident)   . Acute respiratory failure with hypercapnia ([place]) 06/02/2020  . Acute metabolic encephalopathy 75/09/2584  . CKD (chronic kidney disease), stage IV ([place]) 06/02/2020  . PAF (paroxysmal atrial fibrillation) ([place]) 06/02/2020  . Hypothyroidism 06/02/2020  . Chronic anticoagulation 06/02/2020  . Debility 06/01/2020  . Lumbar spondylosis 10/04/2018  . Chronic respiratory failure with hypoxia ([place]) 09/16/2018  . Pain of right thumb 06/19/2018  . Bilateral recurrent inguinal hernias 11/28/2017  . Carotid artery stenosis 01/12/2016  . Chronic systolic CHF (congestive heart failure) ([place]) 12/02/2015  . Acute on chronic combined systolic and diastolic CHF (congestive heart failure) ([address]) 11/11/2015  . 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 PVD (peripheral vascular disease) ([place])   . Hereditary and idiopathic peripheral neuropathy 10/02/2015  . Peripelvic (lymphatic) cyst   . Pernicious anemia 07/15/2015  . Bilateral renal cysts 07/12/2015  . Lung nodule, 72mm RML CT 07/12/15 07/12/2015  . Chronic kidney disease, stage 3b ([place]) 06/14/2015  . Paroxysmal atrial fibrillation ([place]) 05/28/2015  . AAA (abdominal aortic aneurysm) without rupture  ([place]) 09/30/2014  . Cardiomyopathy, ischemic 05/23/2014  . DM (diabetes mellitus), type 2 with renal complications ([place]) 27/78/2423  . CAD (coronary artery disease) 11/09/2010  . Depression, major, single episode, complete remission ([place]) 03/18/2010  . Hypothyroidism 02/03/2009  . Osteoarthritis 05/27/2008  . Hyperlipidemia associated with type 2 diabetes mellitus ([place]) 06/14/2007  . Hypertension associated with diabetes ([place]) 06/14/2007  . GERD 06/14/2007   No current facility-administered medications for this visit. No current outpatient medications on file.  Facility-Administered Medications Ordered in Other Visits:  .  0.9 %  sodium chloride infusion, 250 mL, Intravenous, PRN, Opyd, Ilene Qua, MD .  acetaminophen (TYLENOL) tablet 650 mg, 650 mg, Oral, Q4H PRN, Opyd, Timothy S, MD .  albuterol (VENTOLIN HFA) 108 (90 Base) MCG/ACT inhaler 2 puff, 2 puff, Inhalation, Q4H PRN, Opyd, Ilene Qua, MD .  amiodarone (PACERONE) tablet 100 mg, 100 mg, Oral, Daily, Opyd, Ilene Qua, MD, 100 mg at 02/21/21 1054 .  apixaban (ELIQUIS) tablet 2.5 mg, 2.5 mg, Oral, BID, Opyd, Ilene Qua, MD, 2.5 mg at 02/21/21 2200 .  atorvastatin (LIPITOR) tablet 80 mg, 80 mg, Oral, QPM, Opyd, Ilene Qua, MD, 80 mg at 02/21/21 1702 .  carvedilol (COREG) tablet 12.5 mg, 12.5 mg, Oral, BID WC, Opyd, Ilene Qua, MD, 12.5 mg at 02/20/21 1649 .  FLUoxetine (PROZAC) capsule 20 mg, 20 mg, Oral, Daily, Opyd, Ilene Qua, MD, 20 mg at 02/21/21 1054 .  furosemide (LASIX) injection 60 mg, 60 mg, Intravenous, Q12H, Opyd, Ilene Qua, MD, 60 mg at 02/22/21 5361 .  gabapentin (NEURONTIN) capsule 100 mg, 100 mg, Oral, TID, Opyd, Ilene Qua, MD, 100 mg at 02/21/21 2200 .  hydrALAZINE (APRESOLINE) tablet 50 mg, 50 mg, Oral,  TID, Opyd, Ilene Qua, MD, 50 mg at 02/21/21 2200 .  insulin aspart (novoLOG) injection 0-9 Units, 0-9 Units, Subcutaneous, TID WC, Opyd, Ilene Qua, MD, 3 Units at 02/21/21 1648 .  isosorbide mononitrate (IMDUR) 24 hr tablet  30 mg, 30 mg, Oral, Daily, Opyd, Ilene Qua, MD, 30 mg at 02/21/21 1053 .  levothyroxine (SYNTHROID) tablet 100 mcg, 100 mcg, Oral, QAC breakfast, Opyd, Ilene Qua, MD, 100 mcg at 02/22/21 [phone] .  ondansetron (ZOFRAN) injection 4 mg, 4 mg, Intravenous, Q6H PRN, Opyd, Timothy S, MD .  pantoprazole (PROTONIX) EC tablet 40 mg, 40 mg, Oral, BID, Opyd, Ilene Qua, MD, 40 mg at 02/21/21 2200 .  potassium chloride SA (KLOR-CON) CR tablet 20 mEq, 20 mEq, Oral, Daily, Opyd, Ilene Qua, MD, 20 mEq at 02/21/21 1053 .  umeclidinium bromide (INCRUSE ELLIPTA) 62.5 MCG/INH 1 puff, 1 puff, Inhalation, Daily PRN, Hosie Poisson, MD Allergies  Allergen Reactions  . Metoprolol Shortness Of Breath, Palpitations and Other (See Comments)    Heart starts racing. Shallow breathing; pt states he also get skin irritation on his legs  . Metformin Hives and Swelling    On legs  . Metformin And Related Nausea And Vomiting      Social History   Socioeconomic History  . Marital status: Married    Spouse name: Not on file  . Number of children: Not on file  . Years of education: Not on file  . Highest education level: Not on file  Occupational History  . Occupation: Retired  Tobacco Use  . Smoking status: Never Smoker  . Smokeless tobacco: Never Used  . Tobacco comment: quit atleast 25 yrs ago, per pt  Vaping Use  . Vaping Use: Never used  Substance and Sexual Activity  . Alcohol use: Never    Comment: socially  . Drug use: Never  . Sexual activity: Not Currently  Other Topics Concern  . Not on file  Social History Narrative   ** Merged History Encounter **       Pt lives in [place] with spouse. Retired from [employer].  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   Social Determinants of Health   Financial Resource Strain: Low Risk   . Difficulty of Paying Living Expenses: Not hard at all  Food Insecurity: No Food Insecurity  . Worried About Sales executive in the Last Year: Never true  . Ran Out of Food in the Last Year: Never true  Transportation Needs: No Transportation Needs  . Lack of Transportation (Medical): No  . Lack of Transportation (Non-Medical): No  Physical Activity: Inactive  . Days of Exercise per Week: 0 days  . Minutes of Exercise per Session: 0 min  Stress: No Stress Concern Present  . Feeling of Stress : Not at all  Social Connections: Moderately Isolated  . Frequency of Communication with Friends and Family: More than three times a week  . Frequency of Social Gatherings with Friends and Family: More than three times a week  . Attends Religious Services: Never  . Active Member of Clubs or Organizations: No  . Attends Archivist Meetings: Never  . Marital Status: Married  Human resources officer Violence: Not At Risk  . Fear of Current or Ex-Partner: No  . Emotionally Abused: No  . Physically Abused: No  . Sexually Abused: No    Physical Exam      Future Appointments  Date Time Provider [place]  02/26/2021  12:30 PM Ricard Dillon, MD [facility]  04/19/2021  2:00 PM Larey Dresser, MD [facility] None    BP 136/72   Pulse 64   Resp 18   SpO2 94%   Weight yesterday-? Last visit weight-?  Pt reports he is doing well. Denies increased sob, no dizziness, no c/p.   he missed numerous doses of his meds.  2 pill boxes refilled.   Needs these filled by next visit--- --[phone], [id number]  Marylouise Stacks, [facility] Paramedic  02/22/21

## 2021-02-19 DIAGNOSIS — I130 Hypertensive heart and chronic kidney disease with heart failure and stage 1 through stage 4 chronic kidney disease, or unspecified chronic kidney disease: Secondary | ICD-10-CM | POA: Diagnosis not present

## 2021-02-19 DIAGNOSIS — N184 Chronic kidney disease, stage 4 (severe): Secondary | ICD-10-CM | POA: Diagnosis not present

## 2021-02-19 DIAGNOSIS — I480 Paroxysmal atrial fibrillation: Secondary | ICD-10-CM | POA: Diagnosis not present

## 2021-02-19 DIAGNOSIS — I255 Ischemic cardiomyopathy: Secondary | ICD-10-CM | POA: Diagnosis not present

## 2021-02-19 DIAGNOSIS — E1151 Type 2 diabetes mellitus with diabetic peripheral angiopathy without gangrene: Secondary | ICD-10-CM | POA: Diagnosis not present

## 2021-02-19 DIAGNOSIS — E1122 Type 2 diabetes mellitus with diabetic chronic kidney disease: Secondary | ICD-10-CM | POA: Diagnosis not present

## 2021-02-19 DIAGNOSIS — I872 Venous insufficiency (chronic) (peripheral): Secondary | ICD-10-CM | POA: Diagnosis not present

## 2021-02-19 DIAGNOSIS — J449 Chronic obstructive pulmonary disease, unspecified: Secondary | ICD-10-CM | POA: Diagnosis not present

## 2021-02-19 DIAGNOSIS — S81801D Unspecified open wound, right lower leg, subsequent encounter: Secondary | ICD-10-CM | POA: Diagnosis not present

## 2021-02-20 ENCOUNTER — Emergency Department (HOSPITAL_COMMUNITY): Payer: Medicare PPO

## 2021-02-20 ENCOUNTER — Encounter (HOSPITAL_COMMUNITY): Payer: Self-pay | Admitting: Family Medicine

## 2021-02-20 ENCOUNTER — Other Ambulatory Visit: Payer: Self-pay

## 2021-02-20 ENCOUNTER — Inpatient Hospital Stay (HOSPITAL_COMMUNITY)
Admission: EM | Admit: 2021-02-20 | Discharge: 2021-02-24 | DRG: 291 | Disposition: A | Discharge status: Home Health | Payer: Medicare PPO | Attending: Family Medicine | Admitting: Family Medicine

## 2021-02-20 DIAGNOSIS — Z20822 Contact with and (suspected) exposure to covid-19: Secondary | ICD-10-CM | POA: Diagnosis present

## 2021-02-20 DIAGNOSIS — F32A Depression, unspecified: Secondary | ICD-10-CM | POA: Diagnosis present

## 2021-02-20 DIAGNOSIS — I2510 Atherosclerotic heart disease of native coronary artery without angina pectoris: Secondary | ICD-10-CM | POA: Diagnosis present

## 2021-02-20 DIAGNOSIS — I483 Typical atrial flutter: Secondary | ICD-10-CM | POA: Diagnosis not present

## 2021-02-20 DIAGNOSIS — Z86718 Personal history of other venous thrombosis and embolism: Secondary | ICD-10-CM

## 2021-02-20 DIAGNOSIS — E1169 Type 2 diabetes mellitus with other specified complication: Secondary | ICD-10-CM | POA: Diagnosis present

## 2021-02-20 DIAGNOSIS — Z951 Presence of aortocoronary bypass graft: Secondary | ICD-10-CM

## 2021-02-20 DIAGNOSIS — N1832 Chronic kidney disease, stage 3b: Secondary | ICD-10-CM | POA: Diagnosis not present

## 2021-02-20 DIAGNOSIS — E1122 Type 2 diabetes mellitus with diabetic chronic kidney disease: Secondary | ICD-10-CM | POA: Diagnosis not present

## 2021-02-20 DIAGNOSIS — J449 Chronic obstructive pulmonary disease, unspecified: Secondary | ICD-10-CM | POA: Diagnosis present

## 2021-02-20 DIAGNOSIS — Z8249 Family history of ischemic heart disease and other diseases of the circulatory system: Secondary | ICD-10-CM

## 2021-02-20 DIAGNOSIS — N281 Cyst of kidney, acquired: Secondary | ICD-10-CM | POA: Diagnosis present

## 2021-02-20 DIAGNOSIS — J479 Bronchiectasis, uncomplicated: Secondary | ICD-10-CM | POA: Diagnosis present

## 2021-02-20 DIAGNOSIS — E876 Hypokalemia: Secondary | ICD-10-CM | POA: Diagnosis present

## 2021-02-20 DIAGNOSIS — Z7984 Long term (current) use of oral hypoglycemic drugs: Secondary | ICD-10-CM

## 2021-02-20 DIAGNOSIS — I130 Hypertensive heart and chronic kidney disease with heart failure and stage 1 through stage 4 chronic kidney disease, or unspecified chronic kidney disease: Secondary | ICD-10-CM | POA: Diagnosis present

## 2021-02-20 DIAGNOSIS — K219 Gastro-esophageal reflux disease without esophagitis: Secondary | ICD-10-CM | POA: Diagnosis present

## 2021-02-20 DIAGNOSIS — E039 Hypothyroidism, unspecified: Secondary | ICD-10-CM | POA: Diagnosis not present

## 2021-02-20 DIAGNOSIS — I255 Ischemic cardiomyopathy: Secondary | ICD-10-CM | POA: Diagnosis present

## 2021-02-20 DIAGNOSIS — R0600 Dyspnea, unspecified: Secondary | ICD-10-CM | POA: Diagnosis not present

## 2021-02-20 DIAGNOSIS — R0602 Shortness of breath: Secondary | ICD-10-CM | POA: Diagnosis not present

## 2021-02-20 DIAGNOSIS — Z833 Family history of diabetes mellitus: Secondary | ICD-10-CM

## 2021-02-20 DIAGNOSIS — E1142 Type 2 diabetes mellitus with diabetic polyneuropathy: Secondary | ICD-10-CM | POA: Diagnosis present

## 2021-02-20 DIAGNOSIS — J9601 Acute respiratory failure with hypoxia: Secondary | ICD-10-CM | POA: Diagnosis present

## 2021-02-20 DIAGNOSIS — R0902 Hypoxemia: Secondary | ICD-10-CM | POA: Diagnosis not present

## 2021-02-20 DIAGNOSIS — I340 Nonrheumatic mitral (valve) insufficiency: Secondary | ICD-10-CM | POA: Diagnosis not present

## 2021-02-20 DIAGNOSIS — Z8673 Personal history of transient ischemic attack (TIA), and cerebral infarction without residual deficits: Secondary | ICD-10-CM

## 2021-02-20 DIAGNOSIS — I5043 Acute on chronic combined systolic (congestive) and diastolic (congestive) heart failure: Principal | ICD-10-CM | POA: Diagnosis present

## 2021-02-20 DIAGNOSIS — E7849 Other hyperlipidemia: Secondary | ICD-10-CM | POA: Diagnosis present

## 2021-02-20 DIAGNOSIS — Z888 Allergy status to other drugs, medicaments and biological substances status: Secondary | ICD-10-CM

## 2021-02-20 DIAGNOSIS — R001 Bradycardia, unspecified: Secondary | ICD-10-CM | POA: Diagnosis not present

## 2021-02-20 DIAGNOSIS — I739 Peripheral vascular disease, unspecified: Secondary | ICD-10-CM | POA: Diagnosis present

## 2021-02-20 DIAGNOSIS — Z79890 Hormone replacement therapy (postmenopausal): Secondary | ICD-10-CM

## 2021-02-20 DIAGNOSIS — K589 Irritable bowel syndrome without diarrhea: Secondary | ICD-10-CM | POA: Diagnosis present

## 2021-02-20 DIAGNOSIS — Z83438 Family history of other disorder of lipoprotein metabolism and other lipidemia: Secondary | ICD-10-CM

## 2021-02-20 DIAGNOSIS — E1129 Type 2 diabetes mellitus with other diabetic kidney complication: Secondary | ICD-10-CM | POA: Diagnosis present

## 2021-02-20 DIAGNOSIS — K760 Fatty (change of) liver, not elsewhere classified: Secondary | ICD-10-CM | POA: Diagnosis not present

## 2021-02-20 DIAGNOSIS — E1151 Type 2 diabetes mellitus with diabetic peripheral angiopathy without gangrene: Secondary | ICD-10-CM | POA: Diagnosis present

## 2021-02-20 DIAGNOSIS — Z85820 Personal history of malignant melanoma of skin: Secondary | ICD-10-CM

## 2021-02-20 DIAGNOSIS — E559 Vitamin D deficiency, unspecified: Secondary | ICD-10-CM | POA: Diagnosis present

## 2021-02-20 DIAGNOSIS — Z7902 Long term (current) use of antithrombotics/antiplatelets: Secondary | ICD-10-CM

## 2021-02-20 DIAGNOSIS — I480 Paroxysmal atrial fibrillation: Secondary | ICD-10-CM | POA: Diagnosis not present

## 2021-02-20 DIAGNOSIS — M1990 Unspecified osteoarthritis, unspecified site: Secondary | ICD-10-CM | POA: Diagnosis present

## 2021-02-20 DIAGNOSIS — Z79899 Other long term (current) drug therapy: Secondary | ICD-10-CM

## 2021-02-20 DIAGNOSIS — Z808 Family history of malignant neoplasm of other organs or systems: Secondary | ICD-10-CM

## 2021-02-20 DIAGNOSIS — D631 Anemia in chronic kidney disease: Secondary | ICD-10-CM | POA: Diagnosis present

## 2021-02-20 DIAGNOSIS — Z955 Presence of coronary angioplasty implant and graft: Secondary | ICD-10-CM

## 2021-02-20 DIAGNOSIS — I10 Essential (primary) hypertension: Secondary | ICD-10-CM | POA: Diagnosis not present

## 2021-02-20 DIAGNOSIS — I499 Cardiac arrhythmia, unspecified: Secondary | ICD-10-CM | POA: Diagnosis not present

## 2021-02-20 DIAGNOSIS — Z800 Family history of malignant neoplasm of digestive organs: Secondary | ICD-10-CM

## 2021-02-20 LAB — CBC WITH DIFFERENTIAL/PLATELET
Abs Immature Granulocytes: 0.1 10*3/uL — ABNORMAL HIGH (ref 0.00–0.07)
Basophils Absolute: 0 10*3/uL (ref 0.0–0.1)
Basophils Relative: 0 %
Eosinophils Absolute: 0.1 10*3/uL (ref 0.0–0.5)
Eosinophils Relative: 1 %
HCT: 37.4 % — ABNORMAL LOW (ref 39.0–52.0)
Hemoglobin: 11.8 g/dL — ABNORMAL LOW (ref 13.0–17.0)
Immature Granulocytes: 1 %
Lymphocytes Relative: 18 %
Lymphs Abs: 2.7 10*3/uL (ref 0.7–4.0)
MCH: 31.9 pg (ref 26.0–34.0)
MCHC: 31.6 g/dL (ref 30.0–36.0)
MCV: 101.1 fL — ABNORMAL HIGH (ref 80.0–100.0)
Monocytes Absolute: 1.3 10*3/uL — ABNORMAL HIGH (ref 0.1–1.0)
Monocytes Relative: 9 %
Neutro Abs: 11 10*3/uL — ABNORMAL HIGH (ref 1.7–7.7)
Neutrophils Relative %: 71 %
Platelets: 176 10*3/uL (ref 150–400)
RBC: 3.7 MIL/uL — ABNORMAL LOW (ref 4.22–5.81)
RDW: 13.9 % (ref 11.5–15.5)
WBC: 15.3 10*3/uL — ABNORMAL HIGH (ref 4.0–10.5)
nRBC: 0 % (ref 0.0–0.2)

## 2021-02-20 LAB — COMPREHENSIVE METABOLIC PANEL
ALT: 44 U/L (ref 0–44)
AST: 37 U/L (ref 15–41)
Albumin: 3.8 g/dL (ref 3.5–5.0)
Alkaline Phosphatase: 88 U/L (ref 38–126)
Anion gap: 12 (ref 5–15)
BUN: 47 mg/dL — ABNORMAL HIGH (ref 8–23)
CO2: 27 mmol/L (ref 22–32)
Calcium: 9.2 mg/dL (ref 8.9–10.3)
Chloride: 103 mmol/L (ref 98–111)
Creatinine, Ser: 1.64 mg/dL — ABNORMAL HIGH (ref 0.61–1.24)
GFR, Estimated: 39 mL/min — ABNORMAL LOW (ref >60)
Glucose, Bld: 170 mg/dL — ABNORMAL HIGH (ref 70–99)
Potassium: 3.1 mmol/L — ABNORMAL LOW (ref 3.5–5.1)
Sodium: 142 mmol/L (ref 135–145)
Total Bilirubin: 1.3 mg/dL — ABNORMAL HIGH (ref 0.3–1.2)
Total Protein: 7.6 g/dL (ref 6.5–8.1)

## 2021-02-20 LAB — GLUCOSE, CAPILLARY
Glucose-Capillary: 212 mg/dL — ABNORMAL HIGH (ref 70–99)
Glucose-Capillary: 200 mg/dL — ABNORMAL HIGH (ref 70–99)
Glucose-Capillary: 210 mg/dL — ABNORMAL HIGH (ref 70–99)
Glucose-Capillary: 155 mg/dL — ABNORMAL HIGH (ref 70–99)
Glucose-Capillary: 220 mg/dL — ABNORMAL HIGH (ref 70–99)

## 2021-02-20 LAB — RESP PANEL BY RT-PCR (FLU A&B, COVID) ARPGX2
Influenza A by PCR: NEGATIVE
Influenza B by PCR: NEGATIVE
SARS Coronavirus 2 by RT PCR: NEGATIVE

## 2021-02-20 LAB — BLOOD GAS, ARTERIAL
Acid-base deficit: 0.2 mmol/L (ref 0.0–2.0)
Bicarbonate: 24.3 mmol/L (ref 20.0–28.0)
Drawn by: 11249
FIO2: 32
O2 Saturation: 86.6 %
Patient temperature: 97.9
pCO2 arterial: 40.4 mmHg (ref 32.0–48.0)
pH, Arterial: 7.394 (ref 7.350–7.450)
pO2, Arterial: 58.7 mmHg — ABNORMAL LOW (ref 83.0–108.0)

## 2021-02-20 LAB — BRAIN NATRIURETIC PEPTIDE: B Natriuretic Peptide: 1833.3 pg/mL — ABNORMAL HIGH (ref 0.0–100.0)

## 2021-02-20 LAB — TROPONIN I (HIGH SENSITIVITY)
Troponin I (High Sensitivity): 36 ng/L — ABNORMAL HIGH (ref <18)
Troponin I (High Sensitivity): 73 ng/L — ABNORMAL HIGH (ref <18)

## 2021-02-20 MED ORDER — HYDRALAZINE HCL 25 MG PO TABS
50.0000 mg | ORAL_TABLET | Freq: Three times a day (TID) | ORAL | Status: DC
  Administered 2021-02-20 – 2021-02-24 (×13): 50 mg via ORAL
  Filled 2021-02-20 (×13): qty 2

## 2021-02-20 MED ORDER — TIOTROPIUM BROMIDE MONOHYDRATE 18 MCG IN CAPS: 18.0000 ug | ORAL_CAPSULE | Freq: Every day | RESPIRATORY_TRACT | Status: DC | PRN

## 2021-02-20 MED ORDER — FLUOXETINE HCL 20 MG PO CAPS
20.0000 mg | ORAL_CAPSULE | Freq: Every day | ORAL | Status: DC
  Administered 2021-02-20 – 2021-02-24 (×5): 20 mg via ORAL
  Filled 2021-02-20 (×5): qty 1

## 2021-02-20 MED ORDER — ACETAMINOPHEN 325 MG PO TABS: 650.0000 mg | ORAL_TABLET | ORAL | Status: DC | PRN

## 2021-02-20 MED ORDER — FUROSEMIDE 10 MG/ML IJ SOLN
60.0000 mg | Freq: Two times a day (BID) | INTRAMUSCULAR | Status: DC
  Administered 2021-02-20 – 2021-02-23 (×6): 60 mg via INTRAVENOUS
  Filled 2021-02-20 (×7): qty 6

## 2021-02-20 MED ORDER — ALBUTEROL SULFATE HFA 108 (90 BASE) MCG/ACT IN AERS: 2.0000 | INHALATION_SPRAY | RESPIRATORY_TRACT | Status: DC | PRN

## 2021-02-20 MED ORDER — PANTOPRAZOLE SODIUM 40 MG PO TBEC
40.0000 mg | DELAYED_RELEASE_TABLET | Freq: Two times a day (BID) | ORAL | Status: DC
  Administered 2021-02-20 – 2021-02-24 (×9): 40 mg via ORAL
  Filled 2021-02-20 (×9): qty 1

## 2021-02-20 MED ORDER — UMECLIDINIUM BROMIDE 62.5 MCG/INH IN AEPB
1.0000 | INHALATION_SPRAY | Freq: Every day | RESPIRATORY_TRACT | Status: DC | PRN
  Filled 2021-02-20: qty 7

## 2021-02-20 MED ORDER — POTASSIUM CHLORIDE CRYS ER 20 MEQ PO TBCR
40.0000 meq | EXTENDED_RELEASE_TABLET | Freq: Once | ORAL | Status: AC
  Administered 2021-02-20: 40 meq via ORAL
  Filled 2021-02-20: qty 2

## 2021-02-20 MED ORDER — AMIODARONE HCL 100 MG PO TABS
100.0000 mg | ORAL_TABLET | Freq: Every day | ORAL | Status: DC
  Administered 2021-02-20 – 2021-02-24 (×6): 100 mg via ORAL
  Filled 2021-02-20 (×5): qty 1

## 2021-02-20 MED ORDER — SODIUM CHLORIDE 0.9 % IV SOLN: 250.0000 mL | INTRAVENOUS | Status: DC | PRN

## 2021-02-20 MED ORDER — FUROSEMIDE 10 MG/ML IJ SOLN
60.0000 mg | Freq: Once | INTRAMUSCULAR | Status: AC
  Administered 2021-02-20: 60 mg via INTRAVENOUS
  Filled 2021-02-20: qty 8

## 2021-02-20 MED ORDER — INSULIN ASPART 100 UNIT/ML ~~LOC~~ SOLN
0.0000 [IU] | Freq: Three times a day (TID) | SUBCUTANEOUS | Status: DC
  Administered 2021-02-20: 2 [IU] via SUBCUTANEOUS
  Administered 2021-02-20: 3 [IU] via SUBCUTANEOUS
  Administered 2021-02-20 – 2021-02-21 (×2): 2 [IU] via SUBCUTANEOUS
  Administered 2021-02-21: 3 [IU] via SUBCUTANEOUS
  Administered 2021-02-22 – 2021-02-24 (×3): 2 [IU] via SUBCUTANEOUS

## 2021-02-20 MED ORDER — ONDANSETRON HCL 4 MG/2ML IJ SOLN: 4.0000 mg | Freq: Four times a day (QID) | INTRAMUSCULAR | Status: DC | PRN

## 2021-02-20 MED ORDER — CILOSTAZOL 100 MG PO TABS: 100.0000 mg | ORAL_TABLET | Freq: Two times a day (BID) | ORAL | Status: DC

## 2021-02-20 MED ORDER — POTASSIUM CHLORIDE CRYS ER 20 MEQ PO TBCR
20.0000 meq | EXTENDED_RELEASE_TABLET | Freq: Every day | ORAL | Status: DC
  Administered 2021-02-20 – 2021-02-24 (×5): 20 meq via ORAL
  Filled 2021-02-20 (×5): qty 1

## 2021-02-20 MED ORDER — ATORVASTATIN CALCIUM 40 MG PO TABS
80.0000 mg | ORAL_TABLET | Freq: Every evening | ORAL | Status: DC
  Administered 2021-02-20 – 2021-02-23 (×4): 80 mg via ORAL
  Filled 2021-02-20 (×4): qty 2

## 2021-02-20 MED ORDER — ISOSORBIDE MONONITRATE ER 30 MG PO TB24
30.0000 mg | ORAL_TABLET | Freq: Every day | ORAL | Status: DC
Start: 2021-02-20 — End: 2021-02-24
  Administered 2021-02-20 – 2021-02-24 (×5): 30 mg via ORAL
  Filled 2021-02-20 (×5): qty 1

## 2021-02-20 MED ORDER — LEVOTHYROXINE SODIUM 100 MCG PO TABS
100.0000 ug | ORAL_TABLET | Freq: Every day | ORAL | Status: DC
  Administered 2021-02-20 – 2021-02-24 (×5): 100 ug via ORAL
  Filled 2021-02-20 (×5): qty 1

## 2021-02-20 MED ORDER — APIXABAN 2.5 MG PO TABS
2.5000 mg | ORAL_TABLET | Freq: Two times a day (BID) | ORAL | Status: DC
  Administered 2021-02-20 – 2021-02-24 (×9): 2.5 mg via ORAL
  Filled 2021-02-20 (×9): qty 1

## 2021-02-20 MED ORDER — CARVEDILOL 12.5 MG PO TABS
12.5000 mg | ORAL_TABLET | Freq: Two times a day (BID) | ORAL | Status: DC
  Administered 2021-02-20 (×2): 12.5 mg via ORAL
  Filled 2021-02-20 (×5): qty 1

## 2021-02-20 MED ORDER — GABAPENTIN 100 MG PO CAPS
100.0000 mg | ORAL_CAPSULE | Freq: Three times a day (TID) | ORAL | Status: DC
  Administered 2021-02-20 – 2021-02-24 (×13): 100 mg via ORAL
  Filled 2021-02-20 (×13): qty 1

## 2021-02-20 NOTE — ED Provider Notes (Signed)
[facility]Y Provider Note  CSN: [id number] Arrival date & time: 02/20/21 0973  Chief Complaint(s) Shortness of Breath  HPI Andrew [name] is a 87 y.o. male with a past medical history listed below including CHF with a last EF of 40 to 45%, COPD not on supplemental oxygen who presents to the emergency department with several days of gradually worsening shortness of breath. Patient does have oxygen at home from prior admission but has not needed it up until 2 days ago. He denies any associated chest pain. Shortness of breath was gradual onset worsening. Exacerbated with coughing and exertion. Initially improved with oxygen shortness of breath was too severe tonight prompting call to EMS. He denies any known fevers or chills. No abdominal pain. No nausea or vomiting. No known sick contacts.  I spoke to patient's wife who reported that the patient is very "lax" with his medications and she is certain he has not taken his home medication for the past 2 days.  When EMS arrived they noted patient saturations were in the 70s on room air. Improved with supplemental oxygen. While transitioning the patient, his saturations dropped while on the nonrebreather. They also noted that patient's lung sounds were diminished throughout. They provided him with 2 rounds of duo nebs, Solu-Medrol, magnesium epinephrine. Patient reports improved shortness of breath after treatment.    HPI  Past Medical History Past Medical History:  Diagnosis Date  . AAA (abdominal aortic aneurysm) ([place])   . Anemia   . Arthritis   . CAD (coronary artery disease)   . Cancer ([place])    Melanoma - Back  . Carotid artery stenosis   . CHF (congestive heart failure) ([place])   . COPD (chronic obstructive pulmonary disease) ([place])   . Cyst of kidney, acquired   . Depression   . Diabetes mellitus   . DVT (deep venous thrombosis) ([place])   . Dysrhythmia   . Fatty liver 2008  . GERD  (gastroesophageal reflux disease)   . Hyperlipidemia   . Hypertension   . IBS (irritable bowel syndrome)   . LVF (left ventricular failure) ([place])   . Paroxysmal atrial fibrillation ([facility])   . Pneumonia Jan. 2014  . Shortness of breath dyspnea   . Thyroid disease   . Typical atrial flutter ([place])   . UTI (urinary tract infection) 05/2015   While in [place]   . Vitamin D deficiency    Patient Active Problem List   Diagnosis Date Noted  . Acute respiratory failure with hypoxia ([place]) 02/20/2021  . COPD (chronic obstructive pulmonary disease) ([place]) 02/20/2021  . History of CVA (cerebrovascular accident)   . Acute respiratory failure with hypercapnia ([place]) 06/02/2020  . Acute metabolic encephalopathy 53/29/9242  . CKD (chronic kidney disease), stage IV ([place]) 06/02/2020  . PAF (paroxysmal atrial fibrillation) ([place]) 06/02/2020  . Hypothyroidism 06/02/2020  . Chronic anticoagulation 06/02/2020  . Debility 06/01/2020  . Lumbar spondylosis 10/04/2018  . Chronic respiratory failure with hypoxia ([place]) 09/16/2018  . Pain of right thumb 06/19/2018  . Bilateral recurrent inguinal hernias 11/28/2017  . Carotid artery stenosis 01/12/2016  . Chronic systolic CHF (congestive heart failure) ([place]) 12/02/2015  . Acute on chronic combined systolic and diastolic CHF (congestive heart failure) ([place]) 11/11/2015  . 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 PVD (peripheral vascular disease) ([place])   . Hereditary and idiopathic peripheral neuropathy 10/02/2015  . Peripelvic (lymphatic) cyst   . Pernicious anemia 07/15/2015  .  Bilateral renal cysts 07/12/2015  . Lung nodule, 92mm RML CT 07/12/15 07/12/2015  . Chronic kidney disease, stage 3b ([place]) 06/14/2015  . Paroxysmal atrial fibrillation ([place]) 05/28/2015  . AAA (abdominal aortic aneurysm) without rupture ([place]) 09/30/2014  . Cardiomyopathy, ischemic 05/23/2014  . DM (diabetes mellitus), type 2 with renal complications ([place])  67/89/3810  . CAD (coronary artery disease) 11/09/2010  . Depression, major, single episode, complete remission ([place]) 03/18/2010  . Hypothyroidism 02/03/2009  . Osteoarthritis 05/27/2008  . Hyperlipidemia associated with type 2 diabetes mellitus ([place]) 06/14/2007  . Hypertension associated with diabetes ([place]) 06/14/2007  . GERD 06/14/2007   Home Medication(s) Prior to Admission medications   Medication Sig Start Date End Date Taking? Authorizing Provider  amiodarone (PACERONE) 200 MG tablet Take 0.5 tablets (100 mg total) by mouth daily. 11/12/20  Yes Larey Dresser, MD  atorvastatin (LIPITOR) 80 MG tablet Take 1 tablet (80 mg total) by mouth daily. Patient taking differently: Take 80 mg by mouth every evening. 11/26/20  Yes Larey Dresser, MD  carvedilol (COREG) 12.5 MG tablet Take 12.5 mg by mouth 2 (two) times daily with a meal.   Yes [provider]  cilostazol (PLETAL) 100 MG tablet Take 1 tablet (100 mg total) by mouth 2 (two) times daily. 09/24/20  Yes Larey Dresser, MD  ELIQUIS 2.5 MG TABS tablet TAKE 1 TABLET BY MOUTH TWICE DAILY. 01/12/21  Yes Larey Dresser, MD  empagliflozin (JARDIANCE) 10 MG TABS tablet Take 1 tablet (10 mg total) by mouth daily before breakfast. 01/14/21  Yes Larey Dresser, MD  ezetimibe (ZETIA) 10 MG tablet TAKE (1) TABLET DAILY. Patient taking differently: Take 10 mg by mouth daily. 11/12/20  Yes Larey Dresser, MD  FLUoxetine (PROZAC) 20 MG capsule TAKE (1) CAPSULE DAILY. Patient taking differently: Take 20 mg by mouth daily. 10/29/20  Yes [name], MD  gabapentin (NEURONTIN) 100 MG capsule Take 100 mg by mouth 3 (three) times daily.   Yes [provider]  glucose blood (ACCU-CHEK AVIVA PLUS) test strip CHECK BLOOD SUGAR 4 TIMES A DAY. 02/19/18  Yes [name], MD  hydrALAZINE (APRESOLINE) 50 MG tablet Take 1 tablet (50 mg total) by mouth 3 (three) times daily. 11/26/20  Yes Larey Dresser, MD  isosorbide mononitrate  (IMDUR) 30 MG 24 hr tablet Take 1 tablet (30 mg total) by mouth daily. 11/26/20  Yes Larey Dresser, MD  levothyroxine (SYNTHROID) 100 MCG tablet TAKE 1 TABLET BEFORE BREAKFAST. Patient taking differently: Take 100 mcg by mouth daily before breakfast. 12/10/20  Yes Larey Dresser, MD  Multiple Vitamins-Minerals (MULTIVITAMIN ADULTS 50+) TABS Take 1 tablet by mouth daily in the afternoon.   Yes [provider]  nitroGLYCERIN (NITROSTAT) 0.4 MG SL tablet DISSOLVE 1 TABLET UNDER TONGUE AS NEEDED FOR CHEST PAIN,MAY REPEAT IN5 MINUTES FOR 2 DOSES. 07/30/19  Yes Larey Dresser, MD  pantoprazole (PROTONIX) 40 MG tablet Take 1 tablet (40 mg total) by mouth 2 (two) times daily. 06/12/20  Yes Charlynne Cousins, MD  potassium chloride SA (KLOR-CON) 20 MEQ tablet Take 20 mEq by mouth daily. 02/27/20  Yes [provider]  tiotropium (SPIRIVA) 18 MCG inhalation capsule Place 18 mcg into inhaler and inhale daily as needed (for wheezing).   Yes [provider]  torsemide (DEMADEX) 20 MG tablet Take 4 tablets (80 mg total) by mouth every morning AND 2 tablets (40 mg total) every evening. 01/14/21  Yes Larey Dresser, MD  acetaminophen (TYLENOL) 500 MG tablet Take 2 tablets (1,000 mg total) by mouth every 8 (eight) hours as needed. 06/01/20   [name], MD  HYDROcodone-acetaminophen (NORCO/VICODIN) 5-325 MG tablet TAKE 1 TABLET EVERY 6 HOURS AS NEEDED FOR MODERATE PAIN. 09/07/20   Evelina Bucy, DPM  metolazone (ZAROXOLYN) 2.5 MG tablet Take 1 tablet (2.5 mg total) by mouth as directed. By [facility] 08/20/20   Larey Dresser, MD                                                                                                                                    Past Surgical History Past Surgical History:  Procedure Laterality Date  . ABDOMINAL AORTIC ANEURYSM REPAIR  2002  . ABDOMINAL AORTOGRAM W/LOWER EXTREMITY Left 08/10/2020   Procedure: ABDOMINAL AORTOGRAM W/LOWER EXTREMITY;   Surgeon: Waynetta Sandy, MD;  Location: [facility];  Service: Cardiovascular;  Laterality: Left;  . BIOPSY  06/10/2020   Procedure: BIOPSY;  Surgeon: Ronnette Juniper, MD;  Location: [facility];  Service: Gastroenterology;;  . CARDIAC CATHETERIZATION    . CORONARY ANGIOPLASTY    . CORONARY ANGIOPLASTY WITH STENT PLACEMENT  2005  . CORONARY ARTERY BYPASS GRAFT  1992  . ESOPHAGEAL BRUSHING  06/10/2020   Procedure: ESOPHAGEAL BRUSHING;  Surgeon: Ronnette Juniper, MD;  Location: [facility];  Service: Gastroenterology;;  . ESOPHAGOGASTRODUODENOSCOPY N/A 06/10/2020   Procedure: ESOPHAGOGASTRODUODENOSCOPY (EGD);  Surgeon: Ronnette Juniper, MD;  Location: [facility];  Service: Gastroenterology;  Laterality: N/A;  . EYE SURGERY     [place]  . HERNIA REPAIR    . INGUINAL HERNIA REPAIR Bilateral    w/mesh  . INSERTION OF MESH N/A 11/28/2017   Procedure: INSERTION OF MESH;  Surgeon: Donnie Mesa, MD;  Location: [facility];  Service: General;  Laterality: N/A;  GENERAL AND TAP BLOCK  . KNEE SURGERY Bilateral   . LAPAROSCOPIC INGUINAL HERNIA WITH UMBILICAL HERNIA Bilateral 11/28/2017   Procedure: LAPAROSCOPIC BILATERAL INGUINAL HERNIA REPAIR WITH MESH, UMBILICAL HERNIA REPAIR WITH MESH;  Surgeon: Donnie Mesa, MD;  Location: [facility];  Service: General;  Laterality: Bilateral;  GENERAL AND TAP BLOCK  . LEFT HEART CATHETERIZATION WITH CORONARY ANGIOGRAM N/A 10/30/2014   Procedure: LEFT HEART CATHETERIZATION WITH CORONARY ANGIOGRAM;  Surgeon: Larey Dresser, MD;  Location: [facility];  Service: Cardiovascular;  Laterality: N/A;  . QUADRICEPS TENDON REPAIR Bilateral 08/2003   Archie Endo 05/10/2011  . UMBILICAL HERNIA REPAIR  11/28/2017   w/mesh   Family History Family History  Problem Relation Age of Onset  . Colon cancer Father   . Coronary artery disease Brother   . Diabetes Brother   . Heart disease Brother   . Hyperlipidemia Brother   . Peripheral vascular disease Brother        Varicose Veins   . Throat cancer Brother        abdominal cancer?   . Diabetes Son   .  Hyperlipidemia Son     Social History Social History   Tobacco Use  . Smoking status: Never Smoker  . Smokeless tobacco: Never Used  . Tobacco comment: quit atleast 25 yrs ago, per pt  Vaping Use  . Vaping Use: Never used  Substance Use Topics  . Alcohol use: Never    Comment: socially  . Drug use: Never   Allergies Metoprolol, Metformin, and Metformin and related  Review of Systems Review of Systems All other systems are reviewed and are negative for acute change except as noted in the HPI  Physical Exam Vital Signs  I have reviewed the triage vital signs BP (!) 163/85 (BP Location: Left Arm)   Pulse (!) 104   Temp 97.7 F (36.5 C) (Oral)   Resp (!) 27   SpO2 97%   Physical Exam Vitals reviewed.  Constitutional:      General: He is not in acute distress.    Appearance: He is well-developed and well-nourished. He is not diaphoretic.  HENT:     Head: Normocephalic and atraumatic.     Nose: Nose normal.  Eyes:     General: No scleral icterus.       Right eye: No discharge.        Left eye: No discharge.     Extraocular Movements: EOM normal.     Conjunctiva/sclera: Conjunctivae normal.     Pupils: Pupils are equal, round, and reactive to light.  Cardiovascular:     Rate and Rhythm: Normal rate and regular rhythm.     Heart sounds: No murmur heard. No friction rub. No gallop.   Pulmonary:     Effort: Pulmonary effort is normal. No respiratory distress.     Breath sounds: No stridor. Examination of the right-upper field reveals wheezing. Examination of the left-upper field reveals wheezing. Examination of the right-middle field reveals decreased breath sounds. Examination of the left-middle field reveals decreased breath sounds. Examination of the right-lower field reveals decreased breath sounds. Examination of the left-lower field reveals decreased breath sounds. Decreased breath sounds and  wheezing present. No rales.  Abdominal:     General: There is no distension.     Palpations: Abdomen is soft.     Tenderness: There is no abdominal tenderness.  Musculoskeletal:        General: No tenderness.     Cervical back: Normal range of motion and neck supple.     Right lower leg: 1+ Pitting Edema present.     Left lower leg: 1+ Pitting Edema present.  Skin:    General: Skin is warm and dry.     Findings: No erythema or rash.  Neurological:     Mental Status: He is alert and oriented to person, place, and time.  Psychiatric:        Mood and Affect: Mood and affect normal.     ED Results and Treatments Labs (all labs ordered are listed, but only abnormal results are displayed) Labs Reviewed  BLOOD GAS, ARTERIAL - Abnormal; Notable for the following components:      Result Value   pO2, Arterial 58.7 (*)    All other components within normal limits  CBC WITH DIFFERENTIAL/PLATELET - Abnormal; Notable for the following components:   WBC 15.3 (*)    RBC 3.70 (*)    Hemoglobin 11.8 (*)    HCT 37.4 (*)    MCV 101.1 (*)    Neutro Abs 11.0 (*)    Monocytes Absolute 1.3 (*)  Abs Immature Granulocytes 0.10 (*)    All other components within normal limits  COMPREHENSIVE METABOLIC PANEL - Abnormal; Notable for the following components:   Potassium 3.1 (*)    Glucose, Bld 170 (*)    BUN 47 (*)    Creatinine, Ser 1.64 (*)    Total Bilirubin 1.3 (*)    GFR, Estimated 39 (*)    All other components within normal limits  BRAIN NATRIURETIC PEPTIDE - Abnormal; Notable for the following components:   B Natriuretic Peptide 1,833.3 (*)    All other components within normal limits  TROPONIN I (HIGH SENSITIVITY) - Abnormal; Notable for the following components:   Troponin I (High Sensitivity) 36 (*)    All other components within normal limits  RESP PANEL BY RT-PCR (FLU A&B, COVID) [id number]  TROPONIN I (HIGH SENSITIVITY)                                                                                                                          EKG  EKG Interpretation  Date/Time:  Saturday February 20 2021 03:07:03 EST Ventricular Rate:  103 PR Interval:    QRS Duration: 164 QT Interval:  410 QTC Calculation: 537 R Axis:   -83 Text Interpretation: Sinus or ectopic atrial tachycardia Ventricular premature complex Nonspecific IVCD with LAD Confirmed by Addison Lank [id number]) on 02/20/2021 4:00:50 AM      Radiology DG Chest Port 1 View  Result Date: 02/20/2021 CLINICAL DATA:  Shortness of breath EXAM: PORTABLE CHEST 1 VIEW COMPARISON:  None. FINDINGS: The heart size and mediastinal contours are within normal limits. Hazy airspace opacity seen at the right lung base and the periphery of the left lung base. No pleural effusion is seen. Aortic knob calcifications are noted. Overlying median sternotomy wires are present. 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 IMPRESSION: Hazy bibasilar airspace opacities which could be due to atelectasis or infectious etiology. Electronically Signed   By: Prudencio Pair M.D.   On: 02/20/2021 03:53    Pertinent labs & imaging results that were available during my care of the patient were reviewed by me and considered in my medical decision making (see chart for details).  Medications Ordered in ED Medications  amiodarone (PACERONE) tablet 100 mg (has no administration in time range)  atorvastatin (LIPITOR) tablet 80 mg (has no administration in time range)  carvedilol (COREG) tablet 12.5 mg (has no administration in time range)  hydrALAZINE (APRESOLINE) tablet 50 mg (has no administration in time range)  isosorbide mononitrate (IMDUR) 24 hr tablet 30 mg (has no administration in time range)  FLUoxetine (PROZAC) capsule 20 mg (has no administration in time range)  levothyroxine (SYNTHROID) tablet 100 mcg (has no administration in time range)  pantoprazole (PROTONIX) EC tablet 40 mg (has no administration in time range)  apixaban  (ELIQUIS) tablet 2.5 mg (has no administration in time range)  gabapentin (NEURONTIN) capsule 100 mg (has no administration in time range)  potassium chloride SA (KLOR-CON) CR  tablet 20 mEq (has no administration in time range)  tiotropium (SPIRIVA) inhalation capsule ([facility] use ONLY) 18 mcg (has no administration in time range)  0.9 %  sodium chloride infusion (has no administration in time range)  acetaminophen (TYLENOL) tablet 650 mg (has no administration in time range)  ondansetron (ZOFRAN) injection 4 mg (has no administration in time range)  furosemide (LASIX) injection 60 mg (has no administration in time range)  potassium chloride SA (KLOR-CON) CR tablet 40 mEq (has no administration in time range)  insulin aspart (novoLOG) injection 0-9 Units (has no administration in time range)  albuterol (VENTOLIN HFA) 108 (90 Base) MCG/ACT inhaler 2 puff (has no administration in time range)  furosemide (LASIX) injection 60 mg (60 mg Intravenous Given 02/20/21 0523)                                                                                                                                    Procedures .1-3 Lead EKG Interpretation Performed by: Fatima Blank, MD Authorized by: Fatima Blank, MD     Interpretation: abnormal     ECG rate:  116   ECG rate assessment: tachycardic     Rhythm: sinus tachycardia     Ectopy: none     Conduction: normal   .Critical Care Performed by: Fatima Blank, MD Authorized by: Fatima Blank, MD   Critical care provider statement:    Critical care time (minutes):  45   Critical care was necessary to treat or prevent imminent or life-threatening deterioration of the following conditions:  Respiratory failure and cardiac failure   Critical care was time spent personally by me on the following activities:  Discussions with consultants, evaluation of patient's response to treatment, examination of patient, ordering and  performing treatments and interventions, ordering and review of laboratory studies, ordering and review of radiographic studies, pulse oximetry, re-evaluation of patient's condition, obtaining history from patient or surrogate and review of old charts   Care discussed with: admitting provider      (including critical care time)  Medical Decision Making / ED Course I have reviewed the nursing notes for this encounter and the patient's prior records (if available in EHR or on provided paperwork).   Shyler Holzman Casagrande was evaluated in Emergency Department on 02/20/2021 for the symptoms described in the history of present illness. He was evaluated in the context of the global COVID-19 pandemic, which necessitated consideration that the patient might be at risk for infection with the SARS-CoV-2 virus that causes COVID-19. Institutional protocols and algorithms that pertain to the evaluation of patients at risk for COVID-19 are in a state of rapid change based on information released by regulatory bodies including the CDC and federal and state organizations. These policies and algorithms were followed during the patient's care in the ED.  Patient presents with hypoxic respiratory distress. Noted to have mild peripheral edema. Lung sounds diminished in  the bases with mild wheezing in the upper regions. Possible combination of CHF and COPD exacerbation. Chest x-ray without obvious pneumonia and he is afebrile. Doubt infectious process. COVID/flu negative.  BNP consistent with CHF exacerbation. Given IV lasix. ABG reassuring  Admitted to medicine.      Final Clinical Impression(s) / ED Diagnoses Final diagnoses:  SOB (shortness of breath)  Acute respiratory failure with hypoxia ([place])      This chart was dictated using voice recognition software.  Despite best efforts to proofread,  errors can occur which can change the documentation meaning.   Fatima Blank, MD 02/20/21 0630

## 2021-02-20 NOTE — Progress Notes (Signed)
CBG 220, no bedtime coverage. Patient bradycardic 40s-50s with normal BP, asymptomatic, provider notified.

## 2021-02-20 NOTE — Progress Notes (Signed)
Received pt from ED to room 1422. Pt a/ox4. No signs of distress noted. Pt is currently on 4L O2 via [place]. VSS. Oriented pt to room and the use of the call bell. Placed all items within reach. Placed patient on tele. Will review orders and continue with the plan of care.

## 2021-02-20 NOTE — H&P (Signed)
History and Physical    Andrew [name] NFA:[id number] DOB: 09-09-1928 DOA: 02/20/2021  PCP: [name], MD   Patient coming from: Home   Chief Complaint: SOB   HPI: Andrew [name] is a 87 y.o. male with medical history significant for COPD, chronic combined systolic and diastolic CHF, chronic kidney disease stage IIIb, hypertension, hypothyroidism, history of CVA, PAD, and atrial fibrillation on Eliquis, now presenting to the emergency department with progressive shortness of breath.  Patient reports insidious development of worsening shortness of breath over the past 2 to 3 days, denies any fevers, cough, wheezing, or leg swelling.  Reports that breathing is worse when the lays down.  His wife noted that he has not been taking his medications recently.  He has supplemental oxygen at home but has not needed it in over a month but began using it a couple days ago due to the current dyspnea.  He was reportedly saturating 78% on room air at home and was treated with duo nebs, 125 mg IV Solu-Medrol, epinephrine, 2 g magnesium, and oxygen via nonrebreather prior to arrival in the ED.  ED Course: Upon arrival to the ED, patient is found to be afebrile, saturating low to mid 90s on 4 L/min of supplemental oxygen, mildly tachypneic, slightly tachycardic, and with blood pressure 160/90.  EKG features sinus or ectopic atrial tachycardia with rate 103, PVC, and nonspecific IVCD with LAD.  Chest x-ray notable for hazy airspace opacities in the bases.  Chemistry panel features a potassium of 3.1 and creatinine 1.64.  CBC notable for leukocytosis to 15,300 and a slight microcytic anemia.  Troponin is 36 and BNP 1833.  COVID-19 screening test is negative.  Patient was given 60 mg IV Lasix in the ED.  Review of Systems:  All other systems reviewed and apart from HPI, are negative.  Past Medical History:  Diagnosis Date  . AAA (abdominal aortic aneurysm) ([place])   . Anemia   . Arthritis   . CAD (coronary  artery disease)   . Cancer ([place])    Melanoma - Back  . Carotid artery stenosis   . CHF (congestive heart failure) ([place])   . COPD (chronic obstructive pulmonary disease) ([place])   . Cyst of kidney, acquired   . Depression   . Diabetes mellitus   . DVT (deep venous thrombosis) ([place])   . Dysrhythmia   . Fatty liver 2008  . GERD (gastroesophageal reflux disease)   . Hyperlipidemia   . Hypertension   . IBS (irritable bowel syndrome)   . LVF (left ventricular failure) ([place])   . Paroxysmal atrial fibrillation ([facility])   . Pneumonia Jan. 2014  . Shortness of breath dyspnea   . Thyroid disease   . Typical atrial flutter ([place])   . UTI (urinary tract infection) 05/2015   While in [place]   . Vitamin D deficiency     Past Surgical History:  Procedure Laterality Date  . ABDOMINAL AORTIC ANEURYSM REPAIR  2002  . ABDOMINAL AORTOGRAM W/LOWER EXTREMITY Left 08/10/2020   Procedure: ABDOMINAL AORTOGRAM W/LOWER EXTREMITY;  Surgeon: Waynetta Sandy, MD;  Location: [place] [facility];  Service: Cardiovascular;  Laterality: Left;  . BIOPSY  06/10/2020   Procedure: BIOPSY;  Surgeon: Ronnette Juniper, MD;  Location: [facility];  Service: Gastroenterology;;  . CARDIAC CATHETERIZATION    . CORONARY ANGIOPLASTY    . CORONARY ANGIOPLASTY WITH STENT PLACEMENT  2005  . CORONARY ARTERY BYPASS GRAFT  1992  . ESOPHAGEAL BRUSHING  06/10/2020   Procedure: ESOPHAGEAL BRUSHING;  Surgeon: Ronnette Juniper, MD;  Location[facility] ENDOSCOPY;  Service: Gastroenterology;;  . ESOPHAGOGASTRODUODENOSCOPY N/A 06/10/2020   Procedure: ESOPHAGOGASTRODUODENOSCOPY (EGD);  Surgeon: Ronnette Juniper, MD;  Location: [place];  Service: Gastroenterology;  Laterality: N/A;  . EYE SURGERY     [place]  . HERNIA REPAIR    . INGUINAL HERNIA REPAIR Bilateral    w/mesh  . INSERTION OF MESH N/A 11/28/2017   Procedure: INSERTION OF MESH;  Surgeon: Donnie Mesa, MD;  Location: [place];  Service: General;  Laterality: N/A;  GENERAL AND TAP BLOCK  .  KNEE SURGERY Bilateral   . LAPAROSCOPIC INGUINAL HERNIA WITH UMBILICAL HERNIA Bilateral 11/28/2017   Procedure: LAPAROSCOPIC BILATERAL INGUINAL HERNIA REPAIR WITH MESH, UMBILICAL HERNIA REPAIR WITH MESH;  Surgeon: Donnie Mesa, MD;  Location: [facility];  Service: General;  Laterality: Bilateral;  GENERAL AND TAP BLOCK  . LEFT HEART CATHETERIZATION WITH CORONARY ANGIOGRAM N/A 10/30/2014   Procedure: LEFT HEART CATHETERIZATION WITH CORONARY ANGIOGRAM;  Surgeon: Larey Dresser, MD;  Location: [facility] CATH LAB;  Service: Cardiovascular;  Laterality: N/A;  . QUADRICEPS TENDON REPAIR Bilateral 08/2003   Archie Endo 05/10/2011  . UMBILICAL HERNIA REPAIR  11/28/2017   w/mesh    Social History:   reports that he has never smoked. He has never used smokeless tobacco. He reports that he does not drink alcohol and does not use drugs.  Allergies  Allergen Reactions  . Metoprolol Shortness Of Breath, Palpitations and Other (See Comments)    Heart starts racing. Shallow breathing; pt states he also get skin irritation on his legs  . Metformin Hives and Swelling    On legs  . Metformin And Related Nausea And Vomiting    Family History  Problem Relation Age of Onset  . Colon cancer Father   . Coronary artery disease Brother   . Diabetes Brother   . Heart disease Brother   . Hyperlipidemia Brother   . Peripheral vascular disease Brother        Varicose Veins  . Throat cancer Brother        abdominal cancer?   . Diabetes Son   . Hyperlipidemia Son      Prior to Admission medications   Medication Sig Start Date End Date Taking? Authorizing Provider  amiodarone (PACERONE) 200 MG tablet Take 0.5 tablets (100 mg total) by mouth daily. 11/12/20  Yes Larey Dresser, MD  atorvastatin (LIPITOR) 80 MG tablet Take 1 tablet (80 mg total) by mouth daily. Patient taking differently: Take 80 mg by mouth every evening. 11/26/20  Yes Larey Dresser, MD  carvedilol (COREG) 12.5 MG tablet Take 12.5 mg by mouth 2 (two)  times daily with a meal.   Yes [provider]  cilostazol (PLETAL) 100 MG tablet Take 1 tablet (100 mg total) by mouth 2 (two) times daily. 09/24/20  Yes Larey Dresser, MD  ELIQUIS 2.5 MG TABS tablet TAKE 1 TABLET BY MOUTH TWICE DAILY. 01/12/21  Yes Larey Dresser, MD  empagliflozin (JARDIANCE) 10 MG TABS tablet Take 1 tablet (10 mg total) by mouth daily before breakfast. 01/14/21  Yes Larey Dresser, MD  ezetimibe (ZETIA) 10 MG tablet TAKE (1) TABLET DAILY. Patient taking differently: Take 10 mg by mouth daily. 11/12/20  Yes Larey Dresser, MD  FLUoxetine (PROZAC) 20 MG capsule TAKE (1) CAPSULE DAILY. Patient taking differently: Take 20 mg by mouth daily. 10/29/20  Yes [name], MD  gabapentin (NEURONTIN) 100 MG  capsule Take 100 mg by mouth 3 (three) times daily.   Yes [provider]  glucose blood (ACCU-CHEK AVIVA PLUS) test strip CHECK BLOOD SUGAR 4 TIMES A DAY. 02/19/18  Yes [name], MD  hydrALAZINE (APRESOLINE) 50 MG tablet Take 1 tablet (50 mg total) by mouth 3 (three) times daily. 11/26/20  Yes Larey Dresser, MD  isosorbide mononitrate (IMDUR) 30 MG 24 hr tablet Take 1 tablet (30 mg total) by mouth daily. 11/26/20  Yes Larey Dresser, MD  levothyroxine (SYNTHROID) 100 MCG tablet TAKE 1 TABLET BEFORE BREAKFAST. Patient taking differently: Take 100 mcg by mouth daily before breakfast. 12/10/20  Yes Larey Dresser, MD  Multiple Vitamins-Minerals (MULTIVITAMIN ADULTS 50+) TABS Take 1 tablet by mouth daily in the afternoon.   Yes [provider]  nitroGLYCERIN (NITROSTAT) 0.4 MG SL tablet DISSOLVE 1 TABLET UNDER TONGUE AS NEEDED FOR CHEST PAIN,MAY REPEAT IN5 MINUTES FOR 2 DOSES. 07/30/19  Yes Larey Dresser, MD  pantoprazole (PROTONIX) 40 MG tablet Take 1 tablet (40 mg total) by mouth 2 (two) times daily. 06/12/20  Yes Charlynne Cousins, MD  potassium chloride SA (KLOR-CON) 20 MEQ tablet Take 20 mEq by mouth daily. 02/27/20  Yes [provider]  tiotropium (SPIRIVA) 18 MCG inhalation capsule Place 18 mcg into inhaler and inhale daily as needed (for wheezing).   Yes [provider]  torsemide (DEMADEX) 20 MG tablet Take 4 tablets (80 mg total) by mouth every morning AND 2 tablets (40 mg total) every evening. 01/14/21  Yes Larey Dresser, MD  acetaminophen (TYLENOL) 500 MG tablet Take 2 tablets (1,000 mg total) by mouth every 8 (eight) hours as needed. 06/01/20   [name], MD  HYDROcodone-acetaminophen (NORCO/VICODIN) 5-325 MG tablet TAKE 1 TABLET EVERY 6 HOURS AS NEEDED FOR MODERATE PAIN. 09/07/20   Evelina Bucy, DPM  metolazone (ZAROXOLYN) 2.5 MG tablet Take 1 tablet (2.5 mg total) by mouth as directed. By [facility] 08/20/20   Larey Dresser, MD    Physical Exam: Vitals:   02/20/21 0315 02/20/21 0330 02/20/21 0444 02/20/21 0530  BP: (!) 171/90 (!) 150/83 (!) 162/92 (!) 153/82  Pulse: (!) 104 94 73 70  Resp: (!) 27 (!) 27 (!) 26 17  Temp:      TempSrc:      SpO2: 96% 99% 91% 95%  Weight:      Height:        Constitutional: NAD, calm  Eyes: PERTLA, periorbital edema  ENMT: Mucous membranes are moist. Posterior pharynx clear of any exudate or lesions.   Neck: normal, supple, no masses, no thyromegaly Respiratory: mild tachypnea, no wheezing. No pallor or cyanosis.   Cardiovascular: S1 & S2 heard, regular rate and rhythm. Trace pretibial edema. Abdomen: No distension, no tenderness, soft. Bowel sounds active.  Musculoskeletal: no clubbing / cyanosis. No joint deformity upper and lower extremities.   Skin: no significant rashes, lesions, ulcers. Warm, dry, well-perfused. Neurologic: No gross facial asymmetry. Gross hearing deficit. Sensation intact. Moving all extremities.  Psychiatric: Alert and oriented to person, place, and situation. Very pleasant and cooperative.    Labs and Imaging on Admission: I have personally reviewed following labs and imaging studies  CBC: Recent Labs  Lab  02/20/21 0313  WBC 15.3*  NEUTROABS 11.0*  HGB 11.8*  HCT 37.4*  MCV 101.1*  PLT 161   Basic Metabolic Panel: Recent Labs  Lab 02/20/21 0313  NA 142  K 3.1*  CL  103  CO2 27  GLUCOSE 170*  BUN 47*  CREATININE 1.64*  CALCIUM 9.2   GFR: Estimated Creatinine Clearance: 30.2 mL/min (A) (by C-G formula based on SCr of 1.64 mg/dL (H)). Liver Function Tests: Recent Labs  Lab 02/20/21 0313  AST 37  ALT 44  ALKPHOS 88  BILITOT 1.3*  PROT 7.6  ALBUMIN 3.8   No results for input(s): LIPASE, AMYLASE in the last 168 hours. No results for input(s): AMMONIA in the last 168 hours. Coagulation Profile: No results for input(s): INR, PROTIME in the last 168 hours. Cardiac Enzymes: No results for input(s): CKTOTAL, CKMB, CKMBINDEX, TROPONINI in the last 168 hours. BNP (last 3 results) No results for input(s): PROBNP in the last 8760 hours. HbA1C: No results for input(s): HGBA1C in the last 72 hours. CBG: No results for input(s): GLUCAP in the last 168 hours. Lipid Profile: No results for input(s): CHOL, HDL, LDLCALC, TRIG, CHOLHDL, LDLDIRECT in the last 72 hours. Thyroid Function Tests: No results for input(s): TSH, T4TOTAL, FREET4, T3FREE, THYROIDAB in the last 72 hours. Anemia Panel: No results for input(s): VITAMINB12, FOLATE, FERRITIN, TIBC, IRON, RETICCTPCT in the last 72 hours. Urine analysis:    Component Value Date/Time   COLORURINE YELLOW 06/02/2020 1121   APPEARANCEUR CLEAR 06/02/2020 1121   LABSPEC 1.009 06/02/2020 1121   PHURINE 7.0 06/02/2020 1121   GLUCOSEU NEGATIVE 06/02/2020 1121   GLUCOSEU NEGATIVE 01/13/2016 1342   HGBUR SMALL (A) 06/02/2020 1121   HGBUR negative 02/03/2009 0912   BILIRUBINUR NEGATIVE 06/02/2020 1121   BILIRUBINUR small 07/05/2015 1020   KETONESUR NEGATIVE 06/02/2020 1121   PROTEINUR 100 (A) 06/02/2020 1121   UROBILINOGEN 0.2 01/13/2016 1342   NITRITE NEGATIVE 06/02/2020 1121   LEUKOCYTESUR NEGATIVE 06/02/2020 1121   Sepsis  Labs: @LABRCNTIP (procalcitonin:4,lacticidven:4) ) Recent Results (from the past 240 hour(s))  Resp Panel by RT-PCR (Flu A&B, Covid) Nasopharyngeal Swab     Status: None   Collection Time: 02/20/21  3:34 AM   Specimen: Nasopharyngeal Swab; Nasopharyngeal(NP) swabs in vial transport medium  Result Value Ref Range Status   SARS Coronavirus 2 by RT PCR NEGATIVE NEGATIVE Final    Comment: (NOTE) SARS-CoV-2 target nucleic acids are NOT DETECTED.  The SARS-CoV-2 RNA is generally detectable in upper respiratory specimens during the acute phase of infection. The lowest concentration of SARS-CoV-2 viral copies this assay can detect is 138 copies/mL. A negative result does not preclude SARS-Cov-2 infection and should not be used as the sole basis for treatment or other patient management decisions. A negative result may occur with  improper specimen collection/handling, submission of specimen other than nasopharyngeal swab, presence of viral mutation(s) within the areas targeted by this assay, and inadequate number of viral copies(<138 copies/mL). A negative result must be combined with clinical observations, patient history, and epidemiological information. The expected result is Negative.  Fact Sheet for Patients:  EntrepreneurPulse.com.au  Fact Sheet for Healthcare Providers:  IncredibleEmployment.be  This test is no t yet approved or cleared by the [place] FDA and  has been authorized for detection and/or diagnosis of SARS-CoV-2 by FDA under an Emergency Use Authorization (EUA). This EUA will remain  in effect (meaning this test can be used) for the duration of the COVID-19 declaration under Section 564(b)(1) of the Act, 21 U.S.C.section 360bbb-3(b)(1), unless the authorization is terminated  or revoked sooner.       Influenza A by PCR NEGATIVE NEGATIVE Final   Influenza B by PCR NEGATIVE NEGATIVE Final    Comment: (  NOTE) The Xpert Xpress  SARS-CoV-2/FLU/RSV plus assay is intended as an aid in the diagnosis of influenza from Nasopharyngeal swab specimens and should not be used as a sole basis for treatment. Nasal washings and aspirates are unacceptable for Xpert Xpress SARS-CoV-2/FLU/RSV testing.  Fact Sheet for Patients: EntrepreneurPulse.com.au  Fact Sheet for Healthcare Providers: IncredibleEmployment.be  This test is not yet approved or cleared by the [place] FDA and has been authorized for detection and/or diagnosis of SARS-CoV-2 by FDA under an Emergency Use Authorization (EUA). This EUA will remain in effect (meaning this test can be used) for the duration of the COVID-19 declaration under Section 564(b)(1) of the Act, 21 U.S.C. section 360bbb-3(b)(1), unless the authorization is terminated or revoked.  Performed at [facility], [place] [address], [place], [place] [address]      Radiological Exams on Admission: DG Chest Port 1 View  Result Date: 02/20/2021 CLINICAL DATA:  Shortness of breath EXAM: PORTABLE CHEST 1 VIEW COMPARISON:  None. FINDINGS: The heart size and mediastinal contours are within normal limits. Hazy airspace opacity seen at the right lung base and the periphery of the left lung base. No pleural effusion is seen. Aortic knob calcifications are noted. Overlying median sternotomy wires are present. 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 IMPRESSION: Hazy bibasilar airspace opacities which could be due to atelectasis or infectious etiology. Electronically Signed   By: Prudencio Pair M.D.   On: 02/20/2021 03:53    EKG: Independently reviewed. Sinus or ectopic atrial tachycardia, rate 103, PVC, non-specific IVCD with LAD.   Assessment/Plan   1. Acute hypoxic respiratory failure; acute on chronic combined systolic & diastolic CHF  - Presents with progressive SOB over 2-3 days, is found to have new 4 Lpm supplemental O2 requirement, BNP  1833  - Patient reports orthopnea but denies cough, wheeze, or fevers  - Treated with Lasix 60 mg IV in ED  - Continue diuresis with Lasix 60 mg IV q12h, continue Coreg, monitor electrolytes and renal function, continue supplemental O2 as needed    2. COPD  - No cough or wheezing  - Continue Spiriva, as needed albuterol   3. CAD  - No anginal complaints  - Continue statin, beta-blocker, Imdur   4. CKD IIIb  - SCr is 1.64 on admission, consistent with apparent baseline  - Renally-dose medications, monitor while diuresing    5. Hypertension  - BP elevated in ED, anticipate improvement with diuresis   - Continue diuresis, hydralazine, Coreg   6. Hypothyroidism  - Continue Synthroid     7. Type II DM  - A1c was 7.3% in July 2020  - Update A1c, check CBGs, use low-intensity SSI    8. Hypokalemia  - Replace, monitor closely while diuresing    9. Atrial fibrillation  - CHADS-VASc 8 (age x2, CVA x2, CHF, HTN, DM, CAD)  - Continue Eliquis     DVT prophylaxis: Eliquis  Code Status: Full  Level of Care: Level of care: Telemetry Family Communication: None present  Disposition Plan:  Patient is from: Home  Anticipated d/c is to: TBD Anticipated d/c date is: 02/23/21 Patient currently: pending improvement in respiratory status  Consults called: None  Admission status: Inpatient     Vianne Bulls, MD [facility]  02/20/2021, 5:40 AM

## 2021-02-20 NOTE — ED Notes (Signed)
Report called to [place] on 4W

## 2021-02-20 NOTE — ED Notes (Signed)
Attempted report to 4W x2. No answer at this time

## 2021-02-20 NOTE — Plan of Care (Signed)
  Problem: Pain Managment: Goal: General experience of comfort will improve Outcome: Progressing   Problem: Safety: Goal: Ability to remain free from injury will improve Outcome: Progressing   Problem: Skin Integrity: Goal: Risk for impaired skin integrity will decrease Outcome: Progressing   

## 2021-02-20 NOTE — Progress Notes (Signed)
Pt seen and admitted by Dr Myna Hidalgo this am, please note for detailed H&P/    Andrew [name] is a 87 y.o. male with medical history significant for COPD, chronic combined systolic and diastolic CHF, chronic kidney disease stage IIIb, hypertension, hypothyroidism, history of CVA, PAD, and atrial fibrillation on Eliquis, now presenting to the emergency department with progressive shortness of breath.  Patient reports insidious development of worsening shortness of breath over the past 2 to 3 days, denies any fevers, cough, wheezing, or leg swelling.  Reports that breathing is worse when the lays down. Upon arrival to the ED, patient is found to be afebrile, saturating low to mid 90s on 4 L/min of supplemental oxygen, mildly tachypneic, slightly tachycardic, and with blood pressure 160/90.   Chest x-ray notable for hazy airspace opacities in the bases.  He was admitted for acute hypoxic respiratory failure sec to acute systolic and diastolic heart failure.   He was started on IV lasix, continue to monitor.    Hosie Poisson, MD

## 2021-02-20 NOTE — ED Triage Notes (Addendum)
Pt came in with c/o SOB times two days. Per EMS pt has been having worsening SOB over the past couple of days and has required home O2. On arrival, RA saturations were 78%. Pt got two duo nebs, 125mg  of solu medrol, epi, and 2g mag via EMS. On duoneb, pt current saturations are 97%.

## 2021-02-21 ENCOUNTER — Inpatient Hospital Stay (HOSPITAL_COMMUNITY): Payer: Medicare PPO

## 2021-02-21 DIAGNOSIS — I5043 Acute on chronic combined systolic (congestive) and diastolic (congestive) heart failure: Principal | ICD-10-CM

## 2021-02-21 DIAGNOSIS — R0600 Dyspnea, unspecified: Secondary | ICD-10-CM

## 2021-02-21 DIAGNOSIS — I340 Nonrheumatic mitral (valve) insufficiency: Secondary | ICD-10-CM

## 2021-02-21 LAB — BASIC METABOLIC PANEL
Anion gap: 11 (ref 5–15)
BUN: 62 mg/dL — ABNORMAL HIGH (ref 8–23)
CO2: 25 mmol/L (ref 22–32)
Calcium: 8.7 mg/dL — ABNORMAL LOW (ref 8.9–10.3)
Chloride: 103 mmol/L (ref 98–111)
Creatinine, Ser: 1.74 mg/dL — ABNORMAL HIGH (ref 0.61–1.24)
GFR, Estimated: 37 mL/min — ABNORMAL LOW (ref >60)
Glucose, Bld: 214 mg/dL — ABNORMAL HIGH (ref 70–99)
Potassium: 3.9 mmol/L (ref 3.5–5.1)
Sodium: 139 mmol/L (ref 135–145)

## 2021-02-21 LAB — COMPREHENSIVE METABOLIC PANEL
ALT: 33 U/L (ref 0–44)
AST: 29 U/L (ref 15–41)
Albumin: 3.2 g/dL — ABNORMAL LOW (ref 3.5–5.0)
Alkaline Phosphatase: 65 U/L (ref 38–126)
Anion gap: 11 (ref 5–15)
BUN: 61 mg/dL — ABNORMAL HIGH (ref 8–23)
CO2: 24 mmol/L (ref 22–32)
Calcium: 8.6 mg/dL — ABNORMAL LOW (ref 8.9–10.3)
Chloride: 102 mmol/L (ref 98–111)
Creatinine, Ser: 1.83 mg/dL — ABNORMAL HIGH (ref 0.61–1.24)
GFR, Estimated: 35 mL/min — ABNORMAL LOW (ref >60)
Glucose, Bld: 216 mg/dL — ABNORMAL HIGH (ref 70–99)
Potassium: 3.9 mmol/L (ref 3.5–5.1)
Sodium: 137 mmol/L (ref 135–145)
Total Bilirubin: 1 mg/dL (ref 0.3–1.2)
Total Protein: 6.6 g/dL (ref 6.5–8.1)

## 2021-02-21 LAB — CBC
HCT: 30.5 % — ABNORMAL LOW (ref 39.0–52.0)
Hemoglobin: 9.9 g/dL — ABNORMAL LOW (ref 13.0–17.0)
MCH: 32.4 pg (ref 26.0–34.0)
MCHC: 32.5 g/dL (ref 30.0–36.0)
MCV: 99.7 fL (ref 80.0–100.0)
Platelets: 127 10*3/uL — ABNORMAL LOW (ref 150–400)
RBC: 3.06 MIL/uL — ABNORMAL LOW (ref 4.22–5.81)
RDW: 14 % (ref 11.5–15.5)
WBC: 9.7 10*3/uL (ref 4.0–10.5)
nRBC: 0 % (ref 0.0–0.2)

## 2021-02-21 LAB — ECHOCARDIOGRAM COMPLETE
Area-P 1/2: 3.06 cm2
Calc EF: 41.5 %
Height: 67 in
MV VTI: 1.38 cm2
S' Lateral: 4.9 cm
Single Plane A2C EF: 32.5 %
Single Plane A4C EF: 50.2 %
Weight: 2732.8 oz

## 2021-02-21 LAB — MAGNESIUM: Magnesium: 2.7 mg/dL — ABNORMAL HIGH (ref 1.7–2.4)

## 2021-02-21 LAB — HEMOGLOBIN A1C
Hgb A1c MFr Bld: 7.3 % — ABNORMAL HIGH (ref 4.8–5.6)
Mean Plasma Glucose: 162.81 mg/dL

## 2021-02-21 LAB — GLUCOSE, CAPILLARY
Glucose-Capillary: 177 mg/dL — ABNORMAL HIGH (ref 70–99)
Glucose-Capillary: 125 mg/dL — ABNORMAL HIGH (ref 70–99)
Glucose-Capillary: 207 mg/dL — ABNORMAL HIGH (ref 70–99)
Glucose-Capillary: 117 mg/dL — ABNORMAL HIGH (ref 70–99)

## 2021-02-21 NOTE — Progress Notes (Signed)
PROGRESS NOTE    Andrew [name]  GYI:[id number] DOB: Aug 28, 1928 DOA: 02/20/2021 PCP: [name], MD (Confirm with patient/family/NH records and if not entered, this HAS to be entered at [facility] point of entry. "No PCP" if truly none.)   Chief Complaint  Patient presents with  . Shortness of Breath    Brief Narrative:  Andrew [name] a 87 y.o.malewith medical history significant forCOPD, chronic combined systolic and diastolic CHF, chronic kidney disease stage IIIb, hypertension, hypothyroidism, history of CVA, PAD, and atrial fibrillation on Eliquis, now presenting to the emergency department with progressive shortness of breath. Patient reports insidious development of worsening shortness of breath over the past 2 to 3 days, denies any fevers, cough, wheezing, or leg swelling. Reports that breathing is worse when the lays down. Upon arrival to the ED, patient is found to be afebrile, saturating low to mid 90s on 4 L/min of supplemental oxygen, mildly tachypneic, slightly tachycardic, and with blood pressure 160/90. Chest x-ray notable for hazy airspace opacities in the bases.  He was admitted for acute hypoxic respiratory failure sec to acute systolic and diastolic heart failure.   He was started on IV lasix, continue to monitor.   Assessment & Plan:   Principal Problem:   Acute respiratory failure with hypoxia (HCC) Active Problems:   Hypothyroidism   CAD (coronary artery disease)   DM (diabetes mellitus), type 2 with renal complications (HCC)   Chronic kidney disease, stage 3b (HCC)   PVD (peripheral vascular disease) (HCC)   Acute on chronic combined systolic and diastolic CHF (congestive heart failure) (HCC)   PAF (paroxysmal atrial fibrillation) (HCC)   COPD (chronic obstructive pulmonary disease) (HCC)   History of CVA (cerebrovascular accident)   Acute respiratory failure with hypoxia secondary to acute on chronic systolic and diastolic heart  failure Patient is currently requiring up to 4 L of nasal cannula oxygen to keep sats greater than 90%. Diuresis with IV Lasix 60 mg twice daily Continue with strict intake and output and fluid restriction.  Daily weights are not accurate at this time. Echocardiogram ordered for further evaluation. Cardiology consulted for recommendations. PT evaluation later today.    COPD Patient is currently not wheezing at this time continue with bronchodilators as needed.    Stage IIIb CKD Creatinine appears to be at baseline at this time.     Type 2 diabetes mellitus with renal complications Continue with sliding scale insulin at this time.     Hypothyroidism Continue with Synthroid at this time.   Paroxysmal atrial fibrillation Rate controlled with amiodarone, on anticoagulation with Eliquis.  Neck    History of peripheral vascular disease Continue with Eliquis and Lipitor.    Hypertension Blood pressure parameters are borderline to optimal.    Hyperlipidemia Continue with the Lipitor.  Anemia of chronic disease hemoglobin around 9.9. Continue to monitor      DVT prophylaxis: Eliquis.  Code Status: full code.  Family Communication: none at bedside.  Disposition:   Status is: Inpatient  Remains inpatient appropriate because:IV treatments appropriate due to intensity of illness or inability to take PO   Dispo: The patient is from: Home              Anticipated d/c is to: pending.               Patient currently is not medically stable to d/c.   Difficult to place patient No       Level of care: Telemetry Consultants:  Cardiology.   Procedures: none.  Antimicrobials: none.   Subjective: No sob, no chest pain, no nausea, vomiting or abdominal pain.   Objective: Vitals:   02/20/21 2235 02/21/21 0329 02/21/21 0409 02/21/21 1000  BP: 105/62 (!) 111/93  134/73  Pulse: (!) 57 (!) 46  (!) 53  Resp: 19 16  18   Temp:   98.4 F (36.9 C) 98.2 F  (36.8 C)  TempSrc:   Oral   SpO2: 97% 98%  99%  Weight:   77.5 kg   Height:        Intake/Output Summary (Last 24 hours) at 02/21/2021 1136 Last data filed at 02/21/2021 0900 Gross per 24 hour  Intake 150 ml  Output 1300 ml  Net -1150 ml   Filed Weights   02/20/21 0314 02/20/21 0638 02/21/21 0409  Weight: 79.4 kg 81.1 kg 77.5 kg    Examination:  General exam: Appears calm and comfortable  Respiratory system: Clear to auscultation. Respiratory effort normal. Cardiovascular system: S1 & S2 heard, RRR. No JVD,  No pedal edema. Gastrointestinal system: Abdomen is nondistended, soft and nontender.  Normal bowel sounds heard. Central nervous system: Alert and oriented. No focal neurological deficits. Extremities: Symmetric 5 x 5 power. Skin: No rashes, lesions or ulcers Psychiatry:  Mood & affect appropriate.     Data Reviewed: I have personally reviewed following labs and imaging studies  CBC: Recent Labs  Lab 02/20/21 0313 02/21/21 0403  WBC 15.3* 9.7  NEUTROABS 11.0*  --   HGB 11.8* 9.9*  HCT 37.4* 30.5*  MCV 101.1* 99.7  PLT 176 127*    Basic Metabolic Panel: Recent Labs  Lab 02/20/21 0313 02/21/21 0403  NA 142 137  139  K 3.1* 3.9  3.9  CL 103 102  103  CO2 27 24  25   GLUCOSE 170* 216*  214*  BUN 47* 61*  62*  CREATININE 1.64* 1.83*  1.74*  CALCIUM 9.2 8.6*  8.7*  MG  --  2.7*    GFR: Estimated Creatinine Clearance: 26.4 mL/min (A) (by C-G formula based on SCr of 1.74 mg/dL (H)).  Liver Function Tests: Recent Labs  Lab 02/20/21 0313 02/21/21 0403  AST 37 29  ALT 44 33  ALKPHOS 88 65  BILITOT 1.3* 1.0  PROT 7.6 6.6  ALBUMIN 3.8 3.2*    CBG: Recent Labs  Lab 02/20/21 0743 02/20/21 1132 02/20/21 1640 02/20/21 2116 02/21/21 0803  GLUCAP 200* 210* 155* 220* 177*     Recent Results (from the past 240 hour(s))  Resp Panel by RT-PCR (Flu A&B, Covid) Nasopharyngeal Swab     Status: None   Collection Time: 02/20/21  3:34 AM    Specimen: Nasopharyngeal Swab; Nasopharyngeal(NP) swabs in vial transport medium  Result Value Ref Range Status   SARS Coronavirus 2 by RT PCR NEGATIVE NEGATIVE Final    Comment: (NOTE) SARS-CoV-2 target nucleic acids are NOT DETECTED.  The SARS-CoV-2 RNA is generally detectable in upper respiratory specimens during the acute phase of infection. The lowest concentration of SARS-CoV-2 viral copies this assay can detect is 138 copies/mL. A negative result does not preclude SARS-Cov-2 infection and should not be used as the sole basis for treatment or other patient management decisions. A negative result may occur with  improper specimen collection/handling, submission of specimen other than nasopharyngeal swab, presence of viral mutation(s) within the areas targeted by this assay, and inadequate number of viral copies(<138 copies/mL). A negative result must be combined with clinical observations, patient history,  and epidemiological information. The expected result is Negative.  Fact Sheet for Patients:  EntrepreneurPulse.com.au  Fact Sheet for Healthcare Providers:  IncredibleEmployment.be  This test is no t yet approved or cleared by the [place] FDA and  has been authorized for detection and/or diagnosis of SARS-CoV-2 by FDA under an Emergency Use Authorization (EUA). This EUA will remain  in effect (meaning this test can be used) for the duration of the COVID-19 declaration under Section 564(b)(1) of the Act, 21 U.S.C.section 360bbb-3(b)(1), unless the authorization is terminated  or revoked sooner.       Influenza A by PCR NEGATIVE NEGATIVE Final   Influenza B by PCR NEGATIVE NEGATIVE Final    Comment: (NOTE) The Xpert Xpress SARS-CoV-2/FLU/RSV plus assay is intended as an aid in the diagnosis of influenza from Nasopharyngeal swab specimens and should not be used as a sole basis for treatment. Nasal washings and aspirates are  unacceptable for Xpert Xpress SARS-CoV-2/FLU/RSV testing.  Fact Sheet for Patients: EntrepreneurPulse.com.au  Fact Sheet for Healthcare Providers: IncredibleEmployment.be  This test is not yet approved or cleared by the [place] FDA and has been authorized for detection and/or diagnosis of SARS-CoV-2 by FDA under an Emergency Use Authorization (EUA). This EUA will remain in effect (meaning this test can be used) for the duration of the COVID-19 declaration under Section 564(b)(1) of the Act, 21 U.S.C. section 360bbb-3(b)(1), unless the authorization is terminated or revoked.  Performed at [facility], [place] [address] [place], [place] [address]          Radiology Studies: [facility] Chest Port 1 View  Result Date: 02/20/2021 CLINICAL DATA:  Shortness of breath EXAM: PORTABLE CHEST 1 VIEW COMPARISON:  None. FINDINGS: The heart size and mediastinal contours are within normal limits. Hazy airspace opacity seen at the right lung base and the periphery of the left lung base. No pleural effusion is seen. Aortic knob calcifications are noted. Overlying median sternotomy wires are present. 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 IMPRESSION: Hazy bibasilar airspace opacities which could be due to atelectasis or infectious etiology. Electronically Signed   By: Prudencio Pair M.D.   On: 02/20/2021 03:53   ECHOCARDIOGRAM COMPLETE  Result Date: 02/21/2021    ECHOCARDIOGRAM REPORT   Patient Name:   TALBERT TREMBATH Date of Exam: 02/21/2021 Medical Rec #:  [id number]        Height:       67.0 in Accession #:    [id number]       Weight:       170.8 lb Date of Birth:  1928/03/18        BSA:          1.891 m Patient Age:    69 years         BP:           111/93 mmHg Patient Gender: M                HR:           58 bpm. Exam Location:  Inpatient Procedure: 2D Echo Indications:    dyspnea  History:        Patient has prior history of Echocardiogram  examinations, most                 recent 09/15/2020. Prior CABG, COPD and chronic kidney disease,                 Signs/Symptoms:Shortness of Breath; Risk Factors:Diabetes and  Dyslipidemia.  Sonographer:    Johny Chess Referring Phys: [id number] [place]  1. Global hypokinesis with akinesis of the inferolateral walls; overall severe LV dysfunction.  2. Left ventricular ejection fraction, by estimation, is 25 to 30%. The left ventricle has severely decreased function. The left ventricle demonstrates regional wall motion abnormalities (see scoring diagram/findings for description). The left ventricular internal cavity size was mildly dilated. Left ventricular diastolic parameters are indeterminate. Elevated left atrial pressure.  3. Right ventricular systolic function is normal. The right ventricular size is normal.  4. Left atrial size was severely dilated.  5. The mitral valve is normal in structure. Mild mitral valve regurgitation. No evidence of mitral stenosis. Moderate mitral annular calcification.  6. The aortic valve is tricuspid. Aortic valve regurgitation is not visualized. No aortic stenosis is present.  7. The inferior vena cava is dilated in size with <50% respiratory variability, suggesting right atrial pressure of 15 mmHg. FINDINGS  Left Ventricle: Left ventricular ejection fraction, by estimation, is 25 to 30%. The left ventricle has severely decreased function. The left ventricle demonstrates regional wall motion abnormalities. The left ventricular internal cavity size was mildly  dilated. There is no left ventricular hypertrophy. Left ventricular diastolic parameters are indeterminate. Elevated left atrial pressure. Right Ventricle: The right ventricular size is normal.Right ventricular systolic function is normal. Left Atrium: Left atrial size was severely dilated. Right Atrium: Right atrial size was normal in size. Pericardium: There is no evidence of  pericardial effusion. Mitral Valve: The mitral valve is normal in structure. Moderate mitral annular calcification. Mild mitral valve regurgitation. No evidence of mitral valve stenosis. MV peak gradient, 9.4 mmHg. The mean mitral valve gradient is 3.0 mmHg. Tricuspid Valve: The tricuspid valve is normal in structure. Tricuspid valve regurgitation is trivial. No evidence of tricuspid stenosis. Aortic Valve: The aortic valve is tricuspid. Aortic valve regurgitation is not visualized. No aortic stenosis is present. Pulmonic Valve: The pulmonic valve was normal in structure. Pulmonic valve regurgitation is trivial. No evidence of pulmonic stenosis. Aorta: The aortic root is normal in size and structure. Venous: The inferior vena cava is dilated in size with less than 50% respiratory variability, suggesting right atrial pressure of 15 mmHg. IAS/Shunts: The interatrial septum was not well visualized. Additional Comments: Global hypokinesis with akinesis of the inferolateral walls; overall severe LV dysfunction.  LEFT VENTRICLE PLAX 2D LVIDd:         5.60 cm      Diastology LVIDs:         4.90 cm      LV e' medial:    3.59 cm/s LV PW:         0.90 cm      LV E/e' medial:  42.9 LV IVS:        0.90 cm      LV e' lateral:   4.79 cm/s LVOT diam:     2.10 cm      LV E/e' lateral: 32.2 LV SV:         63 LV SV Index:   34 LVOT Area:     3.46 cm  LV Volumes (MOD) LV vol d, MOD A2C: 118.0 ml LV vol d, MOD A4C: 140.0 ml LV vol s, MOD A2C: 79.6 ml LV vol s, MOD A4C: 69.7 ml LV SV MOD A2C:     38.4 ml LV SV MOD A4C:     140.0 ml LV SV MOD BP:      54.5 ml RIGHT  VENTRICLE            IVC RV S prime:     9.25 cm/s  IVC diam: 2.40 cm TAPSE (M-mode): 1.5 cm LEFT ATRIUM            Index       RIGHT ATRIUM          Index LA diam:      4.90 cm  2.59 cm/m  RA Area:     9.98 cm LA Vol (A2C): 104.0 ml 55.00 ml/m RA Volume:   18.10 ml 9.57 ml/m  AORTIC VALVE LVOT Vmax:   70.90 cm/s LVOT Vmean:  50.000 cm/s LVOT VTI:    0.183 m  AORTA Ao  Root diam: 3.30 cm MITRAL VALVE MV Area (PHT): 3.06 cm     SHUNTS MV Area VTI:   1.38 cm     Systemic VTI:  0.18 m MV Peak grad:  9.4 mmHg     Systemic Diam: 2.10 cm MV Mean grad:  3.0 mmHg MV Vmax:       1.53 m/s MV Vmean:      80.7 cm/s MV Decel Time: 248 msec MV E velocity: 154.00 cm/s MV A velocity: 95.50 cm/s MV E/A ratio:  1.61 Kirk Ruths MD Electronically signed by Kirk Ruths MD Signature Date/Time: 02/21/2021/11:27:26 AM    Final         Scheduled Meds: . amiodarone  100 mg Oral Daily  . apixaban  2.5 mg Oral BID  . atorvastatin  80 mg Oral QPM  . carvedilol  12.5 mg Oral BID WC  . FLUoxetine  20 mg Oral Daily  . furosemide  60 mg Intravenous Q12H  . gabapentin  100 mg Oral TID  . hydrALAZINE  50 mg Oral TID  . insulin aspart  0-9 Units Subcutaneous TID WC  . isosorbide mononitrate  30 mg Oral Daily  . levothyroxine  100 mcg Oral QAC breakfast  . pantoprazole  40 mg Oral BID  . potassium chloride SA  20 mEq Oral Daily   Continuous Infusions: . sodium chloride       LOS: 1 day        Hosie Poisson, MD [facility]   To contact the attending provider between 7A-7P or the covering provider during after hours 7P-7A, please log into the web site [email or web address] and access using universal [place] password for that web site. If you do not have the password, please call the hospital operator.  02/21/2021, 11:36 AM

## 2021-02-21 NOTE — Progress Notes (Signed)
Patient's HR remains in 40s-50s, patient asymptomatic, other vital signs stable. EKG done,Provider on call notified, Charge Nurse informed. Will continue to monitor patient.

## 2021-02-21 NOTE — Progress Notes (Signed)
  Echocardiogram 2D Echocardiogram has been performed.  Andrew [name] 02/21/2021, 11:17 AM

## 2021-02-21 NOTE — Consult Note (Signed)
Cardiology Consultation:   Patient ID: Andrew [name] MRN: [id number]; DOB: 1928/06/08  Admit date: 02/20/2021 Date of Consult: 02/21/2021  PCP:  [name], [place]  Cardiologist:  Dr [name] Advanced Practice Provider:  No care team member to display Electrophysiologist:  None    Patient Profile:   Andrew [name] is a 87 y.o. male with a hx of CAD, chronic systolic HF LVEF 51-02%, bronchiectasis, who is being seen today for the evaluation of SOB at the request of Dr Karleen Hampshire.  History of Present Illness:   [name] 87 yo male history of CAD with prior CABG, chronic systolic HF,  PAD, CKD 3, HTN, HL, AAA with prior surgery, carotid stenosis, bronchiectasis, PAF on amio admitted SOB. Denies any chest pain, no cough or wheezing. No LE edema. Reports medication compliance  WBC 15.3 Hgb 11.8 Plt 176 K 3.1 Cr 1.64 BUN 47 BNP 1800 (1900 05/2020)  trop 36-->73 COVID neg ABG 7.39/40/59/24 on FiO2 32% CXR bibasilar opacities, atelectsis vs infection EKG SR, IVCD 08/2020 echo: LVEF 40-45%, grade I dd, normal RV, mild MS,   Past Medical History:  Diagnosis Date  . AAA (abdominal aortic aneurysm) ([place])   . Anemia   . Arthritis   . CAD (coronary artery disease)   . Cancer ([place])    Melanoma - Back  . Carotid artery stenosis   . CHF (congestive heart failure) ([place])   . COPD (chronic obstructive pulmonary disease) ([place])   . Cyst of kidney, acquired   . Depression   . Diabetes mellitus   . DVT (deep venous thrombosis) ([place] [facility])   . Dysrhythmia   . Fatty liver 2008  . GERD (gastroesophageal reflux disease)   . Hyperlipidemia   . Hypertension   . IBS (irritable bowel syndrome)   . LVF (left ventricular failure) ([place])   . Paroxysmal atrial fibrillation ([facility])   . Pneumonia Jan. 2014  . Shortness of breath dyspnea   . Thyroid disease   . Typical atrial flutter ([place])   . UTI (urinary tract infection) 05/2015   While in [place]   . Vitamin D  deficiency     Past Surgical History:  Procedure Laterality Date  . ABDOMINAL AORTIC ANEURYSM REPAIR  2002  . ABDOMINAL AORTOGRAM W/LOWER EXTREMITY Left 08/10/2020   Procedure: ABDOMINAL AORTOGRAM W/LOWER EXTREMITY;  Surgeon: Waynetta Sandy, MD;  Location: [facility];  Service: Cardiovascular;  Laterality: Left;  . BIOPSY  06/10/2020   Procedure: BIOPSY;  Surgeon: Ronnette Juniper, MD;  Location: [facility];  Service: Gastroenterology;;  . CARDIAC CATHETERIZATION    . CORONARY ANGIOPLASTY    . CORONARY ANGIOPLASTY WITH STENT PLACEMENT  2005  . CORONARY ARTERY BYPASS GRAFT  1992  . ESOPHAGEAL BRUSHING  06/10/2020   Procedure: ESOPHAGEAL BRUSHING;  Surgeon: Ronnette Juniper, MD;  Location: [facility];  Service: Gastroenterology;;  . ESOPHAGOGASTRODUODENOSCOPY N/A 06/10/2020   Procedure: ESOPHAGOGASTRODUODENOSCOPY (EGD);  Surgeon: Ronnette Juniper, MD;  Location: [facility] [facility];  Service: Gastroenterology;  Laterality: N/A;  . EYE SURGERY     [place]  . HERNIA REPAIR    . INGUINAL HERNIA REPAIR Bilateral    w/mesh  . INSERTION OF MESH N/A 11/28/2017   Procedure: INSERTION OF MESH;  Surgeon: Donnie Mesa, MD;  Location: [facility];  Service: General;  Laterality: N/A;  GENERAL AND TAP BLOCK  . KNEE SURGERY Bilateral   . LAPAROSCOPIC INGUINAL HERNIA WITH UMBILICAL HERNIA Bilateral 11/28/2017   Procedure: LAPAROSCOPIC  BILATERAL INGUINAL HERNIA REPAIR WITH MESH, UMBILICAL HERNIA REPAIR WITH MESH;  Surgeon: Donnie Mesa, MD;  Location: [facility];  Service: General;  Laterality: Bilateral;  GENERAL AND TAP BLOCK  . LEFT HEART CATHETERIZATION WITH CORONARY ANGIOGRAM N/A 10/30/2014   Procedure: LEFT HEART CATHETERIZATION WITH CORONARY ANGIOGRAM;  Surgeon: Larey Dresser, MD;  Location[facility];  Service: Cardiovascular;  Laterality: N/A;  . QUADRICEPS TENDON REPAIR Bilateral 08/2003   Archie Endo 05/10/2011  . UMBILICAL HERNIA REPAIR  11/28/2017   w/mesh     Inpatient Medications: Scheduled  Meds: . amiodarone  100 mg Oral Daily  . apixaban  2.5 mg Oral BID  . atorvastatin  80 mg Oral QPM  . carvedilol  12.5 mg Oral BID WC  . FLUoxetine  20 mg Oral Daily  . furosemide  60 mg Intravenous Q12H  . gabapentin  100 mg Oral TID  . hydrALAZINE  50 mg Oral TID  . insulin aspart  0-9 Units Subcutaneous TID WC  . isosorbide mononitrate  30 mg Oral Daily  . levothyroxine  100 mcg Oral QAC breakfast  . pantoprazole  40 mg Oral BID  . potassium chloride SA  20 mEq Oral Daily   Continuous Infusions: . sodium chloride     PRN Meds: sodium chloride, acetaminophen, albuterol, ondansetron (ZOFRAN) IV, umeclidinium bromide  Allergies:    Allergies  Allergen Reactions  . Metoprolol Shortness Of Breath, Palpitations and Other (See Comments)    Heart starts racing. Shallow breathing; pt states he also get skin irritation on his legs  . Metformin Hives and Swelling    On legs  . Metformin And Related Nausea And Vomiting    Social History:   Social History   Socioeconomic History  . Marital status: Married    Spouse name: Not on file  . Number of children: Not on file  . Years of education: Not on file  . Highest education level: Not on file  Occupational History  . Occupation: Retired  Tobacco Use  . Smoking status: Never Smoker  . Smokeless tobacco: Never Used  . Tobacco comment: quit atleast 25 yrs ago, per pt  Vaping Use  . Vaping Use: Never used  Substance and Sexual Activity  . Alcohol use: Never    Comment: socially  . Drug use: Never  . Sexual activity: Not Currently  Other Topics Concern  . Not on file  Social History Narrative   ** Merged History Encounter **       Pt lives in [place] with spouse. Retired from [employer].  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   Social Determinants of Health   Financial Resource Strain: Low Risk   . Difficulty of Paying Living Expenses: Not hard at all  Food  Insecurity: No Food Insecurity  . Worried About Charity fundraiser in the Last Year: Never true  . Ran Out of Food in the Last Year: Never true  Transportation Needs: No Transportation Needs  . Lack of Transportation (Medical): No  . Lack of Transportation (Non-Medical): No  Physical Activity: Inactive  . Days of Exercise per Week: 0 days  . Minutes of Exercise per Session: 0 min  Stress: No Stress Concern Present  . Feeling of Stress : Not at all  Social Connections: Moderately Isolated  . Frequency of Communication with Friends and Family: More than three times a week  . Frequency of Social Gatherings with Friends and Family: More than three  times a week  . Attends Religious Services: Never  . Active Member of Clubs or Organizations: No  . Attends Archivist Meetings: Never  . Marital Status: Married  Human resources officer Violence: Not At Risk  . Fear of Current or Ex-Partner: No  . Emotionally Abused: No  . Physically Abused: No  . Sexually Abused: No    Family History:    Family History  Problem Relation Age of Onset  . Colon cancer Father   . Coronary artery disease Brother   . Diabetes Brother   . Heart disease Brother   . Hyperlipidemia Brother   . Peripheral vascular disease Brother        Varicose Veins  . Throat cancer Brother        abdominal cancer?   . Diabetes Son   . Hyperlipidemia Son      ROS:  Please see the history of present illness.   All other ROS reviewed and negative.     Physical Exam/Data:   Vitals:   02/20/21 2118 02/20/21 2235 02/21/21 0329 02/21/21 0409  BP: (!) 102/48 105/62 (!) 111/93   Pulse: (!) 45 (!) 57 (!) 46   Resp: 20 19 16    Temp: 98.2 F (36.8 C)   98.4 F (36.9 C)  TempSrc: Oral   Oral  SpO2: 96% 97% 98%   Weight:    77.5 kg  Height:        Intake/Output Summary (Last 24 hours) at 02/21/2021 0929 Last data filed at 02/21/2021 0600 Gross per 24 hour  Intake 150 ml  Output 800 ml  Net -650 ml   Last 3  Weights 02/21/2021 02/20/2021 02/20/2021  Weight (lbs) 170 lb 12.8 oz 178 lb 12.8 oz 175 lb  Weight (kg) 77.474 kg 81.103 kg 79.379 kg     Body mass index is 26.75 kg/m.  General:  Well nourished, well developed, in no acute distress HEENT: normal Lymph: no adenopathy Neck: no JVD Endocrine:  No thryomegaly Vascular: No carotid bruits; FA pulses 2+ bilaterally without bruits  Cardiac:  normal S1, S2; RRR; no murmur  Lungs:  Crackles bilateral bases  Abd: soft, nontender, no hepatomegaly  Ext: no edema Musculoskeletal:  No deformities, BUE and BLE strength normal and equal Skin: warm and dry  Neuro:  CNs 2-12 intact, no focal abnormalities noted Psych:  Normal affect     Laboratory Data:  High Sensitivity Troponin:   Recent Labs  Lab 02/20/21 0313 02/20/21 0513  TROPONINIHS 36* 73*     Chemistry Recent Labs  Lab 02/20/21 0313 02/21/21 0403  NA 142 137  139  K 3.1* 3.9  3.9  CL 103 102  103  CO2 27 24  25   GLUCOSE 170* 216*  214*  BUN 47* 61*  62*  CREATININE 1.64* 1.83*  1.74*  CALCIUM 9.2 8.6*  8.7*  GFRNONAA 39* 35*  37*  ANIONGAP 12 11  11     Recent Labs  Lab 02/20/21 0313 02/21/21 0403  PROT 7.6 6.6  ALBUMIN 3.8 3.2*  AST 37 29  ALT 44 33  ALKPHOS 88 65  BILITOT 1.3* 1.0   Hematology Recent Labs  Lab 02/20/21 0313 02/21/21 0403  WBC 15.3* 9.7  RBC 3.70* 3.06*  HGB 11.8* 9.9*  HCT 37.4* 30.5*  MCV 101.1* 99.7  MCH 31.9 32.4  MCHC 31.6 32.5  RDW 13.9 14.0  PLT 176 127*   BNP Recent Labs  Lab 02/20/21 0313  BNP 1,833.3*  DDimer No results for input(s): DDIMER in the last 168 hours.   Radiology/Studies:  DG Chest Port 1 View  Result Date: 02/20/2021 CLINICAL DATA:  Shortness of breath EXAM: PORTABLE CHEST 1 VIEW COMPARISON:  None. FINDINGS: The heart size and mediastinal contours are within normal limits. Hazy airspace opacity seen at the right lung base and the periphery of the left lung base. No pleural effusion is seen.  Aortic knob calcifications are noted. Overlying median sternotomy wires are present. 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 IMPRESSION: Hazy bibasilar airspace opacities which could be due to atelectasis or infectious etiology. Electronically Signed   By: Prudencio Pair M.D.   On: 02/20/2021 03:53     Assessment and Plan:   1. Acute on chronic combined systolic/diastolic HF - 01/4400 echo: LVEF 40-45%, grade I dd, normal RV, mild MS,  - CXR bibasilar opacities, atelectsis vs infection. BNP 1800 (1900 05/2020)   - received IV lasix 60mg  x 2 yesterday, on scheduled 60mg  bid - limited I/Os data thus far as just admitted early this AM. Mild uptrend in Cr from 1.6 to 1.8 (last 5 months Cr 2-2.4) so still below his prior ranges.  - weights appear inaccurate 175-->178-->170 (Jan 2022 clinic weight 174 lbs) - f/u repeat echo  - continue IV lasix today, follow I/Os and renal function closely. Remains below prior Cr levels so initial uptrend would not cause me to lower his diuretic at this time.  - symptoms are improving.  - continue home regimen, has not been on ACE/ARB/ARNI/aldactone due to renal function  2. CAD - trop neg, no evidence of acute ischemia at this time  3. COPD/bronchiectasis - per primary team  4. Afib - EKG shows SR, continue his home amio - he is on eliquis dosed appropriately for age and Cr        For questions or updates, please contact [place] Please consult [email or web address] for contact info under    Signed, Carlyle Dolly, MD  02/21/2021 9:29 AM

## 2021-02-22 DIAGNOSIS — I480 Paroxysmal atrial fibrillation: Secondary | ICD-10-CM

## 2021-02-22 DIAGNOSIS — I2510 Atherosclerotic heart disease of native coronary artery without angina pectoris: Secondary | ICD-10-CM

## 2021-02-22 LAB — GLUCOSE, CAPILLARY
Glucose-Capillary: 115 mg/dL — ABNORMAL HIGH (ref 70–99)
Glucose-Capillary: 194 mg/dL — ABNORMAL HIGH (ref 70–99)
Glucose-Capillary: 194 mg/dL — ABNORMAL HIGH (ref 70–99)
Glucose-Capillary: 155 mg/dL — ABNORMAL HIGH (ref 70–99)

## 2021-02-22 LAB — BASIC METABOLIC PANEL
Anion gap: 12 (ref 5–15)
BUN: 64 mg/dL — ABNORMAL HIGH (ref 8–23)
CO2: 28 mmol/L (ref 22–32)
Calcium: 8.6 mg/dL — ABNORMAL LOW (ref 8.9–10.3)
Chloride: 102 mmol/L (ref 98–111)
Creatinine, Ser: 1.81 mg/dL — ABNORMAL HIGH (ref 0.61–1.24)
GFR, Estimated: 35 mL/min — ABNORMAL LOW (ref >60)
Glucose, Bld: 114 mg/dL — ABNORMAL HIGH (ref 70–99)
Potassium: 3.9 mmol/L (ref 3.5–5.1)
Sodium: 142 mmol/L (ref 135–145)

## 2021-02-22 LAB — CBC
HCT: 30.6 % — ABNORMAL LOW (ref 39.0–52.0)
Hemoglobin: 9.6 g/dL — ABNORMAL LOW (ref 13.0–17.0)
MCH: 31.8 pg (ref 26.0–34.0)
MCHC: 31.4 g/dL (ref 30.0–36.0)
MCV: 101.3 fL — ABNORMAL HIGH (ref 80.0–100.0)
Platelets: 140 10*3/uL — ABNORMAL LOW (ref 150–400)
RBC: 3.02 MIL/uL — ABNORMAL LOW (ref 4.22–5.81)
RDW: 14 % (ref 11.5–15.5)
WBC: 11.2 10*3/uL — ABNORMAL HIGH (ref 4.0–10.5)
nRBC: 0 % (ref 0.0–0.2)

## 2021-02-22 MED ORDER — CARVEDILOL 6.25 MG PO TABS
6.2500 mg | ORAL_TABLET | Freq: Two times a day (BID) | ORAL | Status: DC
  Administered 2021-02-22 – 2021-02-24 (×2): 6.25 mg via ORAL
  Filled 2021-02-22 (×4): qty 1

## 2021-02-22 NOTE — Evaluation (Signed)
Occupational Therapy Evaluation Patient Details Name: Andrew [name] MRN: [id number] DOB: December 08, 1928 Today's Date: 02/22/2021    History of Present Illness 87 year old male with a history of hypertension, hyperlipidemia, CHF, paroxysmal atrial fibrillation on Eliquis, carotid stenosis, AAA, fall in past 12 mnths with head strike. Pt admitted after a few days of worsening shortness of breath. He was admitted for acute hypoxic respiratory failure sec to acute systolic and diastolic heart failure.   Clinical Impression   Patient is currently requiring assistance with ADLs including maximum assist with toileting with some bowel incontinence, total assist with seated LE dressing, moderate to maximum assist with bathing, and minimal assist with UE dressing, all of which is below patient's typical baseline of being Modified independent to supervision with spouse at home.  During this evaluation, patient was limited by confusion and generalized weakness, which has the potential to impact patient's safety and independence during functional mobility, as well as performance for ADLs. [place] "6-clicks" Daily Activity Inpatient Short Form score of 15/24 indicates 56.46% ADL impairment this session. Patient lives with his wife, who is able to provide 24/7 supervision and assistance.  Patient demonstrates good rehab potential, and should benefit from continued skilled occupational therapy services while in acute care to maximize safety, independence and quality of life at home.  Continued occupational therapy services in the home is recommended.  ?   Follow Up Recommendations  Home health OT;Supervision/Assistance - 24 hour    Equipment Recommendations       Recommendations for Other Services       Precautions / Restrictions Precautions Precautions: Fall Restrictions Weight Bearing Restrictions: No      Mobility Bed Mobility Overal bed mobility: Needs Assistance Bed Mobility:  Supine to Sit     Supine to sit: Mod assist;HOB elevated     General bed mobility comments: Assistance in part due to confusion with motor planning. Cues for sequencing and assist with LEs and trunk.    Transfers Overall transfer level: Needs assistance Equipment used: Rolling walker (2 wheeled) Transfers: Sit to/from Stand Sit to Stand: Min assist         General transfer comment: Pt placing hands correctly for safety and required Min as to stand from low EOB.    Balance Overall balance assessment: History of Falls   Sitting balance-Leahy Scale: Fair       Standing balance-Leahy Scale: Poor Standing balance comment: Requires BUE support.             High level balance activites: Side stepping High Level Balance Comments: Pt took about 4-5 side steps up to [facility] with assistance to move RW and Min As for steps, cues for sequencing.           ADL either performed or assessed with clinical judgement   ADL Overall ADL's : Needs assistance/impaired Eating/Feeding: Independent;Bed level   Grooming: Set up;Bed level;Cueing for sequencing   Upper Body Bathing: Supervision/ safety;Sitting;Set up   Lower Body Bathing: Moderate assistance;Sitting/lateral leans;Sit to/from stand   Upper Body Dressing : Minimal assistance;Sitting   Lower Body Dressing: Total assistance;Sitting/lateral leans Lower Body Dressing Details (indicate cue type and reason): Pt required Total Assist to don socks while sitting EOB. Pt unable to reach feet or complete figure 4 position.     Toileting- Clothing Manipulation and Hygiene: Maximal assistance Toileting - Clothing Manipulation Details (indicate cue type and reason): Pt found to be soiled when standing from bed. Pt able to hold RW without external assistance  for balance and allow OT to perform peri hygiene with Max Assist.     Functional mobility during ADLs: Moderate assistance;Minimal assistance;Cueing for sequencing;Rolling walker        Vision Baseline Vision/History: Wears glasses Wears Glasses: Reading only Patient Visual Report: No change from baseline       Perception     Praxis      Pertinent Vitals/Pain Pain Assessment: No/denies pain     Hand Dominance Right   Extremity/Trunk Assessment Upper Extremity Assessment Upper Extremity Assessment: Overall WFL for tasks assessed;LUE deficits/detail;RUE deficits/detail RUE Deficits / Details: WFL LUE Deficits / Details: grossly 4-/5 LUE Sensation: WNL LUE Coordination: WNL           Communication Communication Communication: HOH   Cognition Arousal/Alertness: Awake/alert Behavior During Therapy: WFL for tasks assessed/performed Overall Cognitive Status: History of cognitive impairments - at baseline                                 General Comments: Pt needs increased time and cues to state DOB. Pt aware he is a hospital in [place].  Pt does not know date: month or year, and not situation. Pt very pleasant and cooperative.   General Comments       Exercises     Shoulder Instructions      Home Living Family/patient expects to be discharged to:: Private residence Living Arrangements: Spouse/significant other Available Help at Discharge: Family;Available 24 hours/day Type of Home: House Home Access: Stairs to enter [employer] of Steps: 8 steps from garage which is most used entrance. Entrance Stairs-Rails: Can reach both;Left;Right Home Layout: Multi-level Alternate Level Stairs-Number of Steps: Must take 4 steps from living/kitchen area to bedrooms and bathrooms. Alternate Level Stairs-Rails: Right;Left;Can reach both Bathroom Shower/Tub: Occupational psychologist: Standard     Home Equipment: Environmental consultant - 2 wheels;Cane - single point;Wheelchair - manual   Additional Comments: Home O2. Pt MAY have a Rollator vs 2WW. Connection with spouse on phone was faulty, and some information difficult to obtain.       Prior Functioning/Environment Level of Independence: Independent with assistive device(s)        Comments: Per spouse, pt always ambulates with his walker. Uses WC for community outings.        OT Problem List: Cardiopulmonary status limiting activity;Decreased strength;Impaired balance (sitting and/or standing);Decreased knowledge of precautions;Decreased activity tolerance;Decreased safety awareness      OT Treatment/Interventions: Self-care/ADL training;Therapeutic exercise;Therapeutic activities;DME and/or AE instruction;Balance training;Patient/family education    OT Goals(Current goals can be found in the care plan section) Acute Rehab OT Goals Patient Stated Goal: None stated except to go back to bed. OT Goal Formulation: Patient unable to participate in goal setting ADL Goals Pt Will Perform Grooming: standing;with supervision Pt Will Perform Lower Body Dressing: with min assist;sitting/lateral leans;sit to/from stand;with adaptive equipment Pt Will Transfer to Toilet: with modified independence;ambulating Pt Will Perform Toileting - Clothing Manipulation and hygiene: with min guard assist;sitting/lateral leans;sit to/from stand Pt/caregiver will Perform Home Exercise Program: Increased strength;Both right and left upper extremity;With theraband;With Supervision  OT Frequency: Min 2X/week   Barriers to D/C:    8 steps to enter home. 4 steps to bathrooms within home.  Spouse says that at baseline, this has not been problematic for pt.       Co-evaluation              AM-PAC OT "6  Clicks" Daily Activity     Outcome Measure Help from another person eating meals?: None Help from another person taking care of personal grooming?: A Little Help from another person toileting, which includes using toliet, bedpan, or urinal?: A Lot Help from another person bathing (including washing, rinsing, drying)?: A Lot Help from another person to put on and taking off regular upper  body clothing?: A Little Help from another person to put on and taking off regular lower body clothing?: Total 6 Click Score: 15   End of Session Equipment Utilized During Treatment: Gait belt;Rolling walker Nurse Communication: Mobility status  Activity Tolerance: Patient tolerated treatment well Patient left: in bed;with call bell/phone within reach;with bed alarm set  OT Visit Diagnosis: Unsteadiness on feet (R26.81);Muscle weakness (generalized) (M62.81);History of falling (Z91.81)                Time: 5051-8335 OT Time Calculation (min): 39 min Charges:  OT General Charges $OT Visit: 1 Visit OT Evaluation $OT Eval Low Complexity: 1 Low OT Treatments $Self Care/Home Management : 8-22 mins $Therapeutic Activity: 8-22 mins  Anderson [place], [place] Office: [phone] 02/22/2021  Julien Girt 02/22/2021, 10:31 AM

## 2021-02-22 NOTE — Progress Notes (Signed)
PROGRESS NOTE    Andrew [name]  WCB:[id number] DOB: [birth year] DOA: 02/20/2021 PCP: [name], MD    Chief Complaint  Patient presents with  . Shortness of Breath    Brief Narrative:  Andrew [name] a 87 y.o.malewith medical history significant forCOPD, chronic combined systolic and diastolic CHF, chronic kidney disease stage IIIb, hypertension, hypothyroidism, history of CVA, PAD, and atrial fibrillation on Eliquis, now presenting to the emergency department with progressive shortness of breath. Patient reports insidious development of worsening shortness of breath over the past 2 to 3 days, denies any fevers, cough, wheezing, or leg swelling. Reports that breathing is worse when the lays down. Upon arrival to the ED, patient is found to be afebrile, saturating low to mid 90s on 4 L/min of supplemental oxygen, mildly tachypneic, slightly tachycardic, and with blood pressure 160/90. Chest x-ray notable for hazy airspace opacities in the bases.  He was admitted for acute hypoxic respiratory failure sec to acute systolic and diastolic heart failure.  He was started on IV lasix, continue to monitor, diuresed 4.7 lit since admission.  Pt seen and examined at bedside. No new complaints.  Plan to wean his oxygen.   Assessment & Plan:   Principal Problem:   Acute respiratory failure with hypoxia (HCC) Active Problems:   Hypothyroidism   CAD (coronary artery disease)   DM (diabetes mellitus), type 2 with renal complications (HCC)   Chronic kidney disease, stage 3b (HCC)   PVD (peripheral vascular disease) (HCC)   Acute on chronic combined systolic and diastolic CHF (congestive heart failure) (HCC)   PAF (paroxysmal atrial fibrillation) (HCC)   COPD (chronic obstructive pulmonary disease) (HCC)   History of CVA (cerebrovascular accident)   Acute respiratory failure with hypoxia secondary to acute on chronic systolic and diastolic heart failure Patient is currently  requiring up to 4 L of nasal cannula oxygen to keep sats greater than 90%. Diuresis with IV Lasix 60 mg twice daily, diuresed about 4.7 lit since admission.  Continue with strict intake and output and fluid restriction.  Daily weights are not accurate at this time. Echocardiogram showed Global hypokinesis with akinesis of the inferolateral walls; overall  severe LV dysfunction. Left ventricular ejection fraction, by estimation, is 25 to 30%. The  left ventricle has severely decreased function. The left ventricle  demonstrates regional wall motion abnormalities Cardiology consulted for recommendations. PT evaluation later today. Plan to wean him off oxygen.     COPD No wheezing heard. On bronchodilators.     Stage IIIb CKD Creatinine appears to be at baseline at this time.     Type 2 diabetes mellitus with renal complications CBG (last 3)  Recent Labs    02/21/21 1939 02/22/21 0822 02/22/21 1122  GLUCAP 117* 115* 194*   Resume SSI.      Hypothyroidism Continue with Synthroid at this time.   Paroxysmal atrial fibrillation Rate controlled with amiodarone, on anticoagulation with Eliquis.   On stroke prophylaxis.     History of peripheral vascular disease Continue with Eliquis and Lipitor.    Hypertension BP parameters are optimal.     Hyperlipidemia Continue with the Lipitor.  Anemia of chronic disease hemoglobin around 9 and stable.  Continue to monitor   Leukocytosis Unclear etiology probably reactive.   DVT prophylaxis: Eliquis.  Code Status: full code.  Family Communication: none at bedside.  Disposition:   Status is: Inpatient  Remains inpatient appropriate because:IV treatments appropriate due to intensity of illness or inability to  take PO   Dispo: The patient is from: Home              Anticipated d/c is to: pending.               Patient currently is not medically stable to d/c.   Difficult to place patient No       Level of  care: Telemetry Consultants:   Cardiology.   Procedures: none.  Antimicrobials: none.   Subjective: No new complaints.   Objective: Vitals:   02/21/21 1942 02/22/21 0500 02/22/21 0628 02/22/21 1247  BP: 138/75  127/67 (!) 118/57  Pulse: 61  (!) 56 61  Resp: 20 (!) 21 19 20   Temp: 98.6 F (37 C)  98.1 F (36.7 C) 98.4 F (36.9 C)  TempSrc: Oral  Oral   SpO2: 99%  97% 97%  Weight:  77.2 kg    Height:        Intake/Output Summary (Last 24 hours) at 02/22/2021 1509 Last data filed at 02/22/2021 1220 Gross per 24 hour  Intake --  Output 3450 ml  Net -3450 ml   Filed Weights   02/20/21 1025 02/21/21 0409 02/22/21 0500  Weight: 81.1 kg 77.5 kg 77.2 kg    Examination:  General exam: Alert and comfortable not in any kind of distress Respiratory system: Diminished air entry at bases no wheezing heard on 4 L of nasal cannula oxygen Cardiovascular system: S1-S2 heard regular rate rhythm no pedal edema Gastrointestinal system: Abdomen is soft nontender bowel sounds normal Central nervous system: Alert and able to answer all questions appropriately Extremities: No cyanosis or clubbing Skin: No rashes seen Psychiatry: Mood is appropriate     Data Reviewed: I have personally reviewed following labs and imaging studies  CBC: Recent Labs  Lab 02/20/21 0313 02/21/21 0403 02/22/21 0335  WBC 15.3* 9.7 11.2*  NEUTROABS 11.0*  --   --   HGB 11.8* 9.9* 9.6*  HCT 37.4* 30.5* 30.6*  MCV 101.1* 99.7 101.3*  PLT 176 127* 140*    Basic Metabolic Panel: Recent Labs  Lab 02/20/21 0313 02/21/21 0403 02/22/21 0335  NA 142 137  139 142  K 3.1* 3.9  3.9 3.9  CL 103 102  103 102  CO2 27 24  25 28   GLUCOSE 170* 216*  214* 114*  BUN 47* 61*  62* 64*  CREATININE 1.64* 1.83*  1.74* 1.81*  CALCIUM 9.2 8.6*  8.7* 8.6*  MG  --  2.7*  --     GFR: Estimated Creatinine Clearance: 25.4 mL/min (A) (by C-G formula based on SCr of 1.81 mg/dL (H)).  Liver Function  Tests: Recent Labs  Lab 02/20/21 0313 02/21/21 0403  AST 37 29  ALT 44 33  ALKPHOS 88 65  BILITOT 1.3* 1.0  PROT 7.6 6.6  ALBUMIN 3.8 3.2*    CBG: Recent Labs  Lab 02/21/21 1210 02/21/21 1624 02/21/21 1939 02/22/21 0822 02/22/21 1122  GLUCAP 125* 207* 117* 115* 194*     Recent Results (from the past 240 hour(s))  Resp Panel by RT-PCR (Flu A&B, Covid) Nasopharyngeal Swab     Status: None   Collection Time: 02/20/21  3:34 AM   Specimen: Nasopharyngeal Swab; Nasopharyngeal(NP) swabs in vial transport medium  Result Value Ref Range Status   SARS Coronavirus 2 by RT PCR NEGATIVE NEGATIVE Final    Comment: (NOTE) SARS-CoV-2 target nucleic acids are NOT DETECTED.  The SARS-CoV-2 RNA is generally detectable in upper respiratory specimens during the  acute phase of infection. The lowest concentration of SARS-CoV-2 viral copies this assay can detect is 138 copies/mL. A negative result does not preclude SARS-Cov-2 infection and should not be used as the sole basis for treatment or other patient management decisions. A negative result may occur with  improper specimen collection/handling, submission of specimen other than nasopharyngeal swab, presence of viral mutation(s) within the areas targeted by this assay, and inadequate number of viral copies(<138 copies/mL). A negative result must be combined with clinical observations, patient history, and epidemiological information. The expected result is Negative.  Fact Sheet for Patients:  EntrepreneurPulse.com.au  Fact Sheet for Healthcare Providers:  IncredibleEmployment.be  This test is no t yet approved or cleared by the [place] FDA and  has been authorized for detection and/or diagnosis of SARS-CoV-2 by FDA under an Emergency Use Authorization (EUA). This EUA will remain  in effect (meaning this test can be used) for the duration of the COVID-19 declaration under Section 564(b)(1)  of the Act, 21 U.S.C.section 360bbb-3(b)(1), unless the authorization is terminated  or revoked sooner.       Influenza A by PCR NEGATIVE NEGATIVE Final   Influenza B by PCR NEGATIVE NEGATIVE Final    Comment: (NOTE) The Xpert Xpress SARS-CoV-2/FLU/RSV plus assay is intended as an aid in the diagnosis of influenza from Nasopharyngeal swab specimens and should not be used as a sole basis for treatment. Nasal washings and aspirates are unacceptable for Xpert Xpress SARS-CoV-2/FLU/RSV testing.  Fact Sheet for Patients: EntrepreneurPulse.com.au  Fact Sheet for Healthcare Providers: IncredibleEmployment.be  This test is not yet approved or cleared by the [place] FDA and has been authorized for detection and/or diagnosis of SARS-CoV-2 by FDA under an Emergency Use Authorization (EUA). This EUA will remain in effect (meaning this test can be used) for the duration of the COVID-19 declaration under Section 564(b)(1) of the Act, 21 U.S.C. section 360bbb-3(b)(1), unless the authorization is terminated or revoked.  Performed at [facility], [place] [address], [place], [place] [address]          Radiology Studies: ECHOCARDIOGRAM COMPLETE  Result Date: 02/21/2021    ECHOCARDIOGRAM REPORT   Patient Name:   Andrew [name] Date of Exam: 02/21/2021 Medical Rec #:  [id number]        Height:       67.0 in Accession #:    [id number]       Weight:       170.8 lb Date of Birth:  1928-08-13        BSA:          1.891 m Patient Age:    61 years         BP:           111/93 mmHg Patient Gender: M                HR:           58 bpm. Exam Location:  Inpatient Procedure: 2D Echo Indications:    dyspnea  History:        Patient has prior history of Echocardiogram examinations, most                 recent 09/15/2020. Prior CABG, COPD and chronic kidney disease,                 Signs/Symptoms:Shortness of Breath; Risk Factors:Diabetes and  Dyslipidemia.  Sonographer:    Johny Chess Referring Phys: [id number] [place]  1. Global hypokinesis with akinesis of the inferolateral walls; overall severe LV dysfunction.  2. Left ventricular ejection fraction, by estimation, is 25 to 30%. The left ventricle has severely decreased function. The left ventricle demonstrates regional wall motion abnormalities (see scoring diagram/findings for description). The left ventricular internal cavity size was mildly dilated. Left ventricular diastolic parameters are indeterminate. Elevated left atrial pressure.  3. Right ventricular systolic function is normal. The right ventricular size is normal.  4. Left atrial size was severely dilated.  5. The mitral valve is normal in structure. Mild mitral valve regurgitation. No evidence of mitral stenosis. Moderate mitral annular calcification.  6. The aortic valve is tricuspid. Aortic valve regurgitation is not visualized. No aortic stenosis is present.  7. The inferior vena cava is dilated in size with <50% respiratory variability, suggesting right atrial pressure of 15 mmHg. FINDINGS  Left Ventricle: Left ventricular ejection fraction, by estimation, is 25 to 30%. The left ventricle has severely decreased function. The left ventricle demonstrates regional wall motion abnormalities. The left ventricular internal cavity size was mildly  dilated. There is no left ventricular hypertrophy. Left ventricular diastolic parameters are indeterminate. Elevated left atrial pressure. Right Ventricle: The right ventricular size is normal.Right ventricular systolic function is normal. Left Atrium: Left atrial size was severely dilated. Right Atrium: Right atrial size was normal in size. Pericardium: There is no evidence of pericardial effusion. Mitral Valve: The mitral valve is normal in structure. Moderate mitral annular calcification. Mild mitral valve regurgitation. No evidence of mitral valve stenosis. MV peak  gradient, 9.4 mmHg. The mean mitral valve gradient is 3.0 mmHg. Tricuspid Valve: The tricuspid valve is normal in structure. Tricuspid valve regurgitation is trivial. No evidence of tricuspid stenosis. Aortic Valve: The aortic valve is tricuspid. Aortic valve regurgitation is not visualized. No aortic stenosis is present. Pulmonic Valve: The pulmonic valve was normal in structure. Pulmonic valve regurgitation is trivial. No evidence of pulmonic stenosis. Aorta: The aortic root is normal in size and structure. Venous: The inferior vena cava is dilated in size with less than 50% respiratory variability, suggesting right atrial pressure of 15 mmHg. IAS/Shunts: The interatrial septum was not well visualized. Additional Comments: Global hypokinesis with akinesis of the inferolateral walls; overall severe LV dysfunction.  LEFT VENTRICLE PLAX 2D LVIDd:         5.60 cm      Diastology LVIDs:         4.90 cm      LV e' medial:    3.59 cm/s LV PW:         0.90 cm      LV E/e' medial:  42.9 LV IVS:        0.90 cm      LV e' lateral:   4.79 cm/s LVOT diam:     2.10 cm      LV E/e' lateral: 32.2 LV SV:         63 LV SV Index:   34 LVOT Area:     3.46 cm  LV Volumes (MOD) LV vol d, MOD A2C: 118.0 ml LV vol d, MOD A4C: 140.0 ml LV vol s, MOD A2C: 79.6 ml LV vol s, MOD A4C: 69.7 ml LV SV MOD A2C:     38.4 ml LV SV MOD A4C:     140.0 ml LV SV MOD BP:      54.5 ml RIGHT  VENTRICLE            IVC RV S prime:     9.25 cm/s  IVC diam: 2.40 cm TAPSE (M-mode): 1.5 cm LEFT ATRIUM            Index       RIGHT ATRIUM          Index LA diam:      4.90 cm  2.59 cm/m  RA Area:     9.98 cm LA Vol (A2C): 104.0 ml 55.00 ml/m RA Volume:   18.10 ml 9.57 ml/m  AORTIC VALVE LVOT Vmax:   70.90 cm/s LVOT Vmean:  50.000 cm/s LVOT VTI:    0.183 m  AORTA Ao Root diam: 3.30 cm MITRAL VALVE MV Area (PHT): 3.06 cm     SHUNTS MV Area VTI:   1.38 cm     Systemic VTI:  0.18 m MV Peak grad:  9.4 mmHg     Systemic Diam: 2.10 cm MV Mean grad:  3.0 mmHg MV  Vmax:       1.53 m/s MV Vmean:      80.7 cm/s MV Decel Time: 248 msec MV E velocity: 154.00 cm/s MV A velocity: 95.50 cm/s MV E/A ratio:  1.61 Kirk Ruths MD Electronically signed by Kirk Ruths MD Signature Date/Time: 02/21/2021/11:27:26 AM    Final         Scheduled Meds: . amiodarone  100 mg Oral Daily  . apixaban  2.5 mg Oral BID  . atorvastatin  80 mg Oral QPM  . carvedilol  6.25 mg Oral BID WC  . FLUoxetine  20 mg Oral Daily  . furosemide  60 mg Intravenous Q12H  . gabapentin  100 mg Oral TID  . hydrALAZINE  50 mg Oral TID  . insulin aspart  0-9 Units Subcutaneous TID WC  . isosorbide mononitrate  30 mg Oral Daily  . levothyroxine  100 mcg Oral QAC breakfast  . pantoprazole  40 mg Oral BID  . potassium chloride SA  20 mEq Oral Daily   Continuous Infusions: . sodium chloride       LOS: 2 days        Hosie Poisson, MD [facility]   To contact the attending provider between 7A-7P or the covering provider during after hours 7P-7A, please log into the web site [email or web address] and access using universal [place] password for that web site. If you do not have the password, please call the hospital operator.  02/22/2021, 3:09 PM

## 2021-02-22 NOTE — Evaluation (Signed)
Physical Therapy Evaluation Patient Details Name: Andrew [name] MRN: [id number] DOB: [birth year] Today's Date: 02/22/2021   History of Present Illness  87 y.o. male admitted to [facility] on 2/26 with progressive ShOB over past 3 days, CXR bibasilar opacities, atelectsis vs infection. Workup for acute respiratory failure secondary to acute on chronic HF. PMH includes COPD, chronic combined systolic and diastolic CHF, chronic kidney disease stage IIIb, AAA with repair 2002, fall in the past 12 months with head strike, CAD with history of stenting, melanoma, DM, hypertension, hypothyroidism, history of CVA, PAD, and atrial fibrillation.  Clinical Impression   Pt presents with generalized weakness, impaired standing balance with history of falls, impaired activity tolerance, DOE 2/4 during mobility, impaired safety with mobility, and impaired gait. Pt to benefit from acute PT to address deficits. Pt ambulated hallway distance with use of RW; SpO2 90-91% on RA during most of hallway ambulation, brief dip to 87% which quickly recovered to 90% with rest. PT placed pt on 2LO2 at end of session, RN notified. PT recommending HHPT, PT feels pt is likely close to mobility baseline. PT to progress mobility as tolerated, and will continue to follow acutely.      Follow Up Recommendations Home health PT;Supervision for mobility/OOB    Equipment Recommendations  None recommended by PT    Recommendations for Other Services       Precautions / Restrictions Precautions Precautions: Fall Restrictions Weight Bearing Restrictions: No      Mobility  Bed Mobility Overal bed mobility: Needs Assistance Bed Mobility: Supine to Sit;Sit to Supine     Supine to sit: Min assist;HOB elevated Sit to supine: Min assist;HOB elevated   General bed mobility comments: min assist for clearing trunk off bed, lifting LEs back into bed upon return to supine. Boost assist with bed and bed pads upon return to supine.     Transfers Overall transfer level: Needs assistance Equipment used: Rolling walker (2 wheeled) Transfers: Sit to/from Stand Sit to Stand: Min assist         General transfer comment: light power up and steadying assist, pt with correct hand placement when rising/sitting.  Ambulation/Gait Ambulation/Gait assistance: Min guard Gait Distance (Feet): 85 Feet Assistive device: Rolling walker (2 wheeled) Gait Pattern/deviations: Step-through pattern;Decreased stride length;Shuffle;Trunk flexed Gait velocity: decr   General Gait Details: Min guard for safety, verbal cuing for hallway navigation, placement within RW, upright posture. SpO2 90-91% on RA during most of hallway ambulation, brief dip to 87% which quickly recovered to 90% with rest.  Stairs            Wheelchair Mobility    Modified Rankin (Stroke Patients Only)       Balance Overall balance assessment: Needs assistance;History of Falls   Sitting balance-Leahy Scale: Fair       Standing balance-Leahy Scale: Poor Standing balance comment: reliant on external support             High level balance activites: Side stepping High Level Balance Comments: Pt took about 4-5 side steps up to [facility] with assistance to move RW and Min As for steps, cues for sequencing.             Pertinent Vitals/Pain Pain Assessment: No/denies pain    Home Living Family/patient expects to be discharged to:: Private residence Living Arrangements: Spouse/significant other Available Help at Discharge: Family;Available 24 hours/day Type of Home: House Home Access: Stairs to enter Entrance Stairs-Rails: Can reach both;Left;Right Entrance Stairs-Number of Steps: 8 steps from  garage which is most used entrance. Home Layout: Multi-level Home Equipment: Walker - 2 wheels;Cane - single point;Wheelchair - manual Additional Comments: Home O2. Pt MAY have a Rollator vs 2WW. Connection with spouse on phone was faulty, and some  information difficult to obtain.    Prior Function Level of Independence: Independent with assistive device(s)         Comments: Per spouse, pt always ambulates with his walker (OT called pt's spouse). PT tells this PT that he uses the RW occasionally only. Uses WC for community outings.     Hand Dominance   Dominant Hand: Right    Extremity/Trunk Assessment   Upper Extremity Assessment Upper Extremity Assessment: Defer to OT evaluation RUE Deficits / Details: WFL LUE Deficits / Details: grossly 4-/5 LUE Sensation: WNL LUE Coordination: WNL    Lower Extremity Assessment Lower Extremity Assessment: Generalized weakness (3+/5 hip flexion, knee flex/ext, hip abd/add)    Cervical / Trunk Assessment Cervical / Trunk Assessment: Kyphotic;Other exceptions Cervical / Trunk Exceptions: forward head, rounded shoulders  Communication   Communication: HOH  Cognition Arousal/Alertness: Awake/alert Behavior During Therapy: WFL for tasks assessed/performed Overall Cognitive Status: History of cognitive impairments - at baseline                                 General Comments: Pt oriented to name, states his birthday is on [birth year] when birthday is 02-08-2028. Oriented to [facility], year is 2022. Pt is pleasantly confused, requires multimodal cuing and increased time to follow commands.      General Comments General comments (skin integrity, edema, etc.): SpO2 87-95% on RA during gait    Exercises     Assessment/Plan    PT Assessment Patient needs continued PT services  PT Problem List Decreased strength;Decreased mobility;Decreased safety awareness;Decreased activity tolerance;Decreased balance;Decreased knowledge of use of DME;Decreased cognition;Cardiopulmonary status limiting activity       PT Treatment Interventions DME instruction;Therapeutic activities;Gait training;Therapeutic exercise;Patient/family education;Balance training;Neuromuscular  re-education;Functional mobility training;Stair training    PT Goals (Current goals can be found in the Care Plan section)  Acute Rehab PT Goals Patient Stated Goal: home to wife PT Goal Formulation: With patient Time For Goal Achievement: 03/08/21 Potential to Achieve Goals: Good    Frequency Min 3X/week   Barriers to discharge        Co-evaluation               AM-PAC PT "6 Clicks" Mobility  Outcome Measure Help needed turning from your back to your side while in a flat bed without using bedrails?: A Little Help needed moving from lying on your back to sitting on the side of a flat bed without using bedrails?: A Little Help needed moving to and from a bed to a chair (including a wheelchair)?: A Little Help needed standing up from a chair using your arms (e.g., wheelchair or bedside chair)?: A Little Help needed to walk in hospital room?: A Little Help needed climbing 3-5 steps with a railing? : A Lot 6 Click Score: 17    End of Session Equipment Utilized During Treatment: Gait belt;Oxygen (2LO2 reapplied at end of session) Activity Tolerance: Patient limited by fatigue Patient left: in bed;with call bell/phone within reach;with bed alarm set Nurse Communication: Mobility status;Other (comment) (O2 requirements) PT Visit Diagnosis: Other abnormalities of gait and mobility (R26.89);Difficulty in walking, not elsewhere classified (R26.2);History of falling (Z91.81)    Time: [phone]  PT Time Calculation (min) (ACUTE ONLY): 21 min   Charges:   PT Evaluation $PT Eval Low Complexity: 1 Low          Alexa S, PT Acute Rehabilitation Services Pager [phone]  Office [phone]   Louis Matte 02/22/2021, 10:51 AM

## 2021-02-22 NOTE — Plan of Care (Signed)
  Problem: Clinical Measurements: Goal: Will remain free from infection Outcome: Progressing   Problem: Clinical Measurements: Goal: Diagnostic test results will improve Outcome: Progressing   Problem: Clinical Measurements: Goal: Respiratory complications will improve Outcome: Progressing   Problem: Skin Integrity: Goal: Risk for impaired skin integrity will decrease Outcome: Progressing

## 2021-02-22 NOTE — Progress Notes (Signed)
Progress Note  Patient Name: Andrew [name] Date of Encounter: 02/22/2021  [facility] Cardiologist: Loralie Champagne, MD   Subjective   Does not recall why he came to the hospital. Reports breathing is at baseline. He has home O2 which he uses sparingly when needed. He reports rare episodes of chest pain. We reviewed his echocardiogram results. He prefers medical management and would like to avoid invasive procedures.   Inpatient Medications    Scheduled Meds: . amiodarone  100 mg Oral Daily  . apixaban  2.5 mg Oral BID  . atorvastatin  80 mg Oral QPM  . carvedilol  12.5 mg Oral BID WC  . FLUoxetine  20 mg Oral Daily  . furosemide  60 mg Intravenous Q12H  . gabapentin  100 mg Oral TID  . hydrALAZINE  50 mg Oral TID  . insulin aspart  0-9 Units Subcutaneous TID WC  . isosorbide mononitrate  30 mg Oral Daily  . levothyroxine  100 mcg Oral QAC breakfast  . pantoprazole  40 mg Oral BID  . potassium chloride SA  20 mEq Oral Daily   Continuous Infusions: . sodium chloride     PRN Meds: sodium chloride, acetaminophen, albuterol, ondansetron (ZOFRAN) IV, umeclidinium bromide   Vital Signs    Vitals:   02/21/21 1659 02/21/21 1942 02/22/21 0500 02/22/21 0628  BP:  138/75  127/67  Pulse: (!) 53 61  (!) 56  Resp:  20 (!) 21 19  Temp:  98.6 F (37 C)  98.1 F (36.7 C)  TempSrc:  Oral  Oral  SpO2:  99%  97%  Weight:   77.2 kg   Height:        Intake/Output Summary (Last 24 hours) at 02/22/2021 1030 Last data filed at 02/22/2021 0645 Gross per 24 hour  Intake -  Output 1600 ml  Net -1600 ml   Last 3 Weights 02/22/2021 02/21/2021 02/20/2021  Weight (lbs) 170 lb 3.2 oz 170 lb 12.8 oz 178 lb 12.8 oz  Weight (kg) 77.202 kg 77.474 kg 81.103 kg      Telemetry    Sinus rhythm with rates into the low 40s at times - Personally Reviewed  ECG    No new tracings - Personally Reviewed  Physical Exam   GEN: No acute distress.   Neck: No JVD Cardiac: RRR, no murmurs,  rubs, or gallops.  Respiratory: Clear to auscultation bilaterally. GI: Soft, nontender, non-distended  MS: No edema; No deformity. Neuro:  Nonfocal  Psych: Normal affect   Labs    High Sensitivity Troponin:   Recent Labs  Lab 02/20/21 0313 02/20/21 0513  TROPONINIHS 36* 73*      Chemistry Recent Labs  Lab 02/20/21 0313 02/21/21 0403 02/22/21 0335  NA 142 137  139 142  K 3.1* 3.9  3.9 3.9  CL 103 102  103 102  CO2 27 24  25 28   GLUCOSE 170* 216*  214* 114*  BUN 47* 61*  62* 64*  CREATININE 1.64* 1.83*  1.74* 1.81*  CALCIUM 9.2 8.6*  8.7* 8.6*  PROT 7.6 6.6  --   ALBUMIN 3.8 3.2*  --   AST 37 29  --   ALT 44 33  --   ALKPHOS 88 65  --   BILITOT 1.3* 1.0  --   GFRNONAA 39* 35*  37* 35*  ANIONGAP 12 11  11 12      Hematology Recent Labs  Lab 02/20/21 0313 02/21/21 0403 02/22/21 0335  WBC 15.3*  9.7 11.2*  RBC 3.70* 3.06* 3.02*  HGB 11.8* 9.9* 9.6*  HCT 37.4* 30.5* 30.6*  MCV 101.1* 99.7 101.3*  MCH 31.9 32.4 31.8  MCHC 31.6 32.5 31.4  RDW 13.9 14.0 14.0  PLT 176 127* 140*    BNP Recent Labs  Lab 02/20/21 0313  BNP 1,833.3*     DDimer No results for input(s): DDIMER in the last 168 hours.   Radiology    ECHOCARDIOGRAM COMPLETE  Result Date: 02/21/2021    ECHOCARDIOGRAM REPORT   Patient Name:   Andrew [name] Date of Exam: 02/21/2021 Medical Rec #:  [id number]        Height:       67.0 in Accession #:    [id number]       Weight:       170.8 lb Date of Birth:  May 10, 1928        BSA:          1.891 m Patient Age:    23 years         BP:           111/93 mmHg Patient Gender: M                HR:           58 bpm. Exam Location:  Inpatient Procedure: 2D Echo Indications:    dyspnea  History:        Patient has prior history of Echocardiogram examinations, most                 recent 09/15/2020. Prior CABG, COPD and chronic kidney disease,                 Signs/Symptoms:Shortness of Breath; Risk Factors:Diabetes and                 Dyslipidemia.   Sonographer:    Johny Chess Referring Phys: [id number] [place]  1. Global hypokinesis with akinesis of the inferolateral walls; overall severe LV dysfunction.  2. Left ventricular ejection fraction, by estimation, is 25 to 30%. The left ventricle has severely decreased function. The left ventricle demonstrates regional wall motion abnormalities (see scoring diagram/findings for description). The left ventricular internal cavity size was mildly dilated. Left ventricular diastolic parameters are indeterminate. Elevated left atrial pressure.  3. Right ventricular systolic function is normal. The right ventricular size is normal.  4. Left atrial size was severely dilated.  5. The mitral valve is normal in structure. Mild mitral valve regurgitation. No evidence of mitral stenosis. Moderate mitral annular calcification.  6. The aortic valve is tricuspid. Aortic valve regurgitation is not visualized. No aortic stenosis is present.  7. The inferior vena cava is dilated in size with <50% respiratory variability, suggesting right atrial pressure of 15 mmHg. FINDINGS  Left Ventricle: Left ventricular ejection fraction, by estimation, is 25 to 30%. The left ventricle has severely decreased function. The left ventricle demonstrates regional wall motion abnormalities. The left ventricular internal cavity size was mildly  dilated. There is no left ventricular hypertrophy. Left ventricular diastolic parameters are indeterminate. Elevated left atrial pressure. Right Ventricle: The right ventricular size is normal.Right ventricular systolic function is normal. Left Atrium: Left atrial size was severely dilated. Right Atrium: Right atrial size was normal in size. Pericardium: There is no evidence of pericardial effusion. Mitral Valve: The mitral valve is normal in structure. Moderate mitral annular calcification. Mild mitral valve regurgitation. No evidence of mitral valve stenosis. MV peak gradient,  9.4 mmHg.  The mean mitral valve gradient is 3.0 mmHg. Tricuspid Valve: The tricuspid valve is normal in structure. Tricuspid valve regurgitation is trivial. No evidence of tricuspid stenosis. Aortic Valve: The aortic valve is tricuspid. Aortic valve regurgitation is not visualized. No aortic stenosis is present. Pulmonic Valve: The pulmonic valve was normal in structure. Pulmonic valve regurgitation is trivial. No evidence of pulmonic stenosis. Aorta: The aortic root is normal in size and structure. Venous: The inferior vena cava is dilated in size with less than 50% respiratory variability, suggesting right atrial pressure of 15 mmHg. IAS/Shunts: The interatrial septum was not well visualized. Additional Comments: Global hypokinesis with akinesis of the inferolateral walls; overall severe LV dysfunction.  LEFT VENTRICLE PLAX 2D LVIDd:         5.60 cm      Diastology LVIDs:         4.90 cm      LV e' medial:    3.59 cm/s LV PW:         0.90 cm      LV E/e' medial:  42.9 LV IVS:        0.90 cm      LV e' lateral:   4.79 cm/s LVOT diam:     2.10 cm      LV E/e' lateral: 32.2 LV SV:         63 LV SV Index:   34 LVOT Area:     3.46 cm  LV Volumes (MOD) LV vol d, MOD A2C: 118.0 ml LV vol d, MOD A4C: 140.0 ml LV vol s, MOD A2C: 79.6 ml LV vol s, MOD A4C: 69.7 ml LV SV MOD A2C:     38.4 ml LV SV MOD A4C:     140.0 ml LV SV MOD BP:      54.5 ml RIGHT VENTRICLE            IVC RV S prime:     9.25 cm/s  IVC diam: 2.40 cm TAPSE (M-mode): 1.5 cm LEFT ATRIUM            Index       RIGHT ATRIUM          Index LA diam:      4.90 cm  2.59 cm/m  RA Area:     9.98 cm LA Vol (A2C): 104.0 ml 55.00 ml/m RA Volume:   18.10 ml 9.57 ml/m  AORTIC VALVE LVOT Vmax:   70.90 cm/s LVOT Vmean:  50.000 cm/s LVOT VTI:    0.183 m  AORTA Ao Root diam: 3.30 cm MITRAL VALVE MV Area (PHT): 3.06 cm     SHUNTS MV Area VTI:   1.38 cm     Systemic VTI:  0.18 m MV Peak grad:  9.4 mmHg     Systemic Diam: 2.10 cm MV Mean grad:  3.0 mmHg MV Vmax:       1.53 m/s MV  Vmean:      80.7 cm/s MV Decel Time: 248 msec MV E velocity: 154.00 cm/s MV A velocity: 95.50 cm/s MV E/A ratio:  1.61 Kirk Ruths MD Electronically signed by Kirk Ruths MD Signature Date/Time: 02/21/2021/11:27:26 AM    Final     Cardiac Studies   Echocardiogram 02/21/21: 1. Global hypokinesis with akinesis of the inferolateral walls; overall  severe LV dysfunction.  2. Left ventricular ejection fraction, by estimation, is 25 to 30%. The  left ventricle has severely decreased function. The left ventricle  demonstrates regional  wall motion abnormalities (see scoring  diagram/findings for description). The left  ventricular internal cavity size was mildly dilated. Left ventricular  diastolic parameters are indeterminate. Elevated left atrial pressure.  3. Right ventricular systolic function is normal. The right ventricular  size is normal.  4. Left atrial size was severely dilated.  5. The mitral valve is normal in structure. Mild mitral valve  regurgitation. No evidence of mitral stenosis. Moderate mitral annular  calcification.  6. The aortic valve is tricuspid. Aortic valve regurgitation is not  visualized. No aortic stenosis is present.  7. The inferior vena cava is dilated in size with <50% respiratory  variability, suggesting right atrial pressure of 15 mmHg.   Echocardiogram 08/2020: 1. Mild global hypokinesis worse in the anterolateral myocardium. Left  ventricular ejection fraction, by estimation, is 40 to 45%. The left  ventricle has mildly decreased function. The left ventricle demonstrates  regional wall motion abnormalities (see  scoring diagram/findings for description). The left ventricular internal  cavity size was mildly dilated. Left ventricular diastolic parameters are  consistent with Grade I diastolic dysfunction (impaired relaxation).  2. Right ventricular systolic function is normal. The right ventricular  size is normal. There is mildly elevated  pulmonary artery systolic  pressure.  3. Left atrial size was moderately dilated.  4. Mild mitral stenosis at 73 bpm. The mitral valve is degenerative.  Trivial mitral valve regurgitation. Mild mitral stenosis. The mean mitral  valve gradient is 7.0 mmHg. Moderate mitral annular calcification.  5. The aortic valve is tricuspid. There is mild calcification of the  aortic valve. Aortic valve regurgitation is not visualized. No aortic  stenosis is present.  6. The inferior vena cava is dilated in size with <50% respiratory  variability, suggesting right atrial pressure of 15 mmHg.   Patient Profile     87 y.o. male with a PMH of CAD s/p CABG, chronic combined CHF, HTN, HLD, paroxysmal atrial fibrillation, carotid artery stenosis, PAD, AAA s/p repair, DM type 2, CKD stage 3, and bronchiectasis, who is being followed by cardiology for the evaluated of acute on chronic combined CHF.  Assessment & Plan    1. Acute on chronic combined CHF: patient presented with SOB. BNP elevated to 1800 and CXR showed bibasilar opacities c/w atelectasis vs infection. Echo showed EF 25-30% (previously 40-45% 08/2020) with global hypokinesis and akinesis of the inferolateral walls (prior anterolateral WMA 08/2020), indeterminate LV diastolic function, normal RV size/function, severe LAE, and mild MR. He was started on IV lasix 60mg  BID with UOP -2.1 L in the past 24 hours (intake not documented). Weight is down to 170.2lbs from 178lbs on admission. Cr appears to be about baseline at 1.8, though limits GDMT, in addition to bradycardia. BP is intermittently soft but overall stable. He feels back to baseline.  - Would try to wean off O2 - Suspect he can transition to po lasix today - does not appear to have been on lasix at home, though suspect he would benefit from a low dose at discharge.  - Will reduce carvedilol to 6.25mg  BID to minimize bradycardia - Continue hydralazine and imdur - Continue to monitor strict I&Os  and daily weights - Continue to monitor electrolytes and replete as needed to maintain K >4, Mg >2  2. CAD s/p CABG: echo this admission with reduction in LV from 40-45% 08/2020 to 25-30% this admission. RWMA noted with global hypokinesis and akinesis of the inferolateral walls, previously noted to be anterolateral WMA. HsTrop 36>73 this admission;  low flat trend not c/w ACS. Not on aspirin due to need for anticoagulation. - Will review echo with Dr. Harrell Gave to determine if true change in chronic WMA  - Continue statin and BBlocker  3. Paroxysmal atrial fibrillation: no occurrences on telemetry this admission. He has baseline bradycardia - Continue carvedilol for rate control - Continue amiodarone for rhythm control - Continue apixaban for stroke ppx - dose reduced for age and Cr >1.5  4. HTN: BP stable  - Managed in the context of #1  5. CKD stage 3: Cr stable at 1.8 which appears to be within his baseline range - Continue to monitor closely with diuresis      For questions or updates, please contact [place] Please consult [email or web address] for contact info under        Signed, Abigail Butts, PA-C  02/22/2021, 10:30 AM

## 2021-02-23 DIAGNOSIS — N1832 Chronic kidney disease, stage 3b: Secondary | ICD-10-CM

## 2021-02-23 DIAGNOSIS — R0602 Shortness of breath: Secondary | ICD-10-CM

## 2021-02-23 LAB — CBC
HCT: 30.6 % — ABNORMAL LOW (ref 39.0–52.0)
Hemoglobin: 9.8 g/dL — ABNORMAL LOW (ref 13.0–17.0)
MCH: 31.9 pg (ref 26.0–34.0)
MCHC: 32 g/dL (ref 30.0–36.0)
MCV: 99.7 fL (ref 80.0–100.0)
Platelets: 133 10*3/uL — ABNORMAL LOW (ref 150–400)
RBC: 3.07 MIL/uL — ABNORMAL LOW (ref 4.22–5.81)
RDW: 13.8 % (ref 11.5–15.5)
WBC: 8.9 10*3/uL (ref 4.0–10.5)
nRBC: 0 % (ref 0.0–0.2)

## 2021-02-23 LAB — GLUCOSE, CAPILLARY
Glucose-Capillary: 109 mg/dL — ABNORMAL HIGH (ref 70–99)
Glucose-Capillary: 150 mg/dL — ABNORMAL HIGH (ref 70–99)
Glucose-Capillary: 137 mg/dL — ABNORMAL HIGH (ref 70–99)
Glucose-Capillary: 126 mg/dL — ABNORMAL HIGH (ref 70–99)

## 2021-02-23 LAB — BASIC METABOLIC PANEL
Anion gap: 11 (ref 5–15)
BUN: 63 mg/dL — ABNORMAL HIGH (ref 8–23)
CO2: 28 mmol/L (ref 22–32)
Calcium: 8.5 mg/dL — ABNORMAL LOW (ref 8.9–10.3)
Chloride: 100 mmol/L (ref 98–111)
Creatinine, Ser: 1.68 mg/dL — ABNORMAL HIGH (ref 0.61–1.24)
GFR, Estimated: 38 mL/min — ABNORMAL LOW (ref >60)
Glucose, Bld: 113 mg/dL — ABNORMAL HIGH (ref 70–99)
Potassium: 3.8 mmol/L (ref 3.5–5.1)
Sodium: 139 mmol/L (ref 135–145)

## 2021-02-23 MED ORDER — TORSEMIDE 20 MG PO TABS
80.0000 mg | ORAL_TABLET | Freq: Every day | ORAL | Status: DC
  Administered 2021-02-24: 80 mg via ORAL
  Filled 2021-02-23: qty 4

## 2021-02-23 MED ORDER — TORSEMIDE 20 MG PO TABS: 60.0000 mg | ORAL_TABLET | Freq: Every day | ORAL | Status: DC

## 2021-02-23 MED ORDER — TORSEMIDE 20 MG PO TABS
40.0000 mg | ORAL_TABLET | Freq: Every day | ORAL | Status: DC
  Administered 2021-02-23 – 2021-02-24 (×2): 40 mg via ORAL
  Filled 2021-02-23 (×2): qty 2

## 2021-02-23 NOTE — Discharge Instructions (Signed)

## 2021-02-23 NOTE — Progress Notes (Addendum)
Writer informed by telemetry pt had 14 beat run of VTach. Pt now Afib 40-50's, sleeping and asymptomatic. MD informed via [facility] page. No new orders at this time.

## 2021-02-23 NOTE — Plan of Care (Signed)
  Problem: Education: Goal: Knowledge of General Education information will improve Description Including pain rating scale, medication(s)/side effects and non-pharmacologic comfort measures Outcome: Progressing   Problem: Health Behavior/Discharge Planning: Goal: Ability to manage health-related needs will improve Outcome: Progressing   Problem: Clinical Measurements: Goal: Ability to maintain clinical measurements within normal limits will improve Outcome: Progressing Goal: Will remain free from infection Outcome: Progressing Goal: Diagnostic test results will improve Outcome: Progressing Goal: Respiratory complications will improve Outcome: Progressing Goal: Cardiovascular complication will be avoided Outcome: Progressing   Problem: Coping: Goal: Level of anxiety will decrease Outcome: Progressing   Problem: Nutrition: Goal: Adequate nutrition will be maintained Outcome: Progressing   Problem: Pain Managment: Goal: General experience of comfort will improve Outcome: Progressing   Problem: Safety: Goal: Ability to remain free from injury will improve Outcome: Progressing   Problem: Skin Integrity: Goal: Risk for impaired skin integrity will decrease Outcome: Progressing   

## 2021-02-23 NOTE — Progress Notes (Addendum)
Progress Note  Patient Name: Andrew [name] Date of Encounter: 02/23/2021  [facility] Cardiologist: Loralie Champagne, MD   Subjective   Reports feeling good this morning. Asking to go home. Denies SOB, palpitations, or chest pain.   Inpatient Medications    Scheduled Meds:  amiodarone  100 mg Oral Daily   apixaban  2.5 mg Oral BID   atorvastatin  80 mg Oral QPM   carvedilol  6.25 mg Oral BID WC   FLUoxetine  20 mg Oral Daily   furosemide  60 mg Intravenous Q12H   gabapentin  100 mg Oral TID   hydrALAZINE  50 mg Oral TID   insulin aspart  0-9 Units Subcutaneous TID WC   isosorbide mononitrate  30 mg Oral Daily   levothyroxine  100 mcg Oral QAC breakfast   pantoprazole  40 mg Oral BID   potassium chloride SA  20 mEq Oral Daily   Continuous Infusions:  sodium chloride     PRN Meds: sodium chloride, acetaminophen, albuterol, ondansetron (ZOFRAN) IV, umeclidinium bromide   Vital Signs    Vitals:   02/22/21 1247 02/22/21 2027 02/23/21 0415 02/23/21 0426  BP: (!) 118/57 128/63 118/62   Pulse: 61 69 (!) 50   Resp: 20 20 20    Temp: 98.4 F (36.9 C) 98.4 F (36.9 C) 98.4 F (36.9 C)   TempSrc:  Oral Oral   SpO2: 97% 96% 94%   Weight:    77.6 kg  Height:        Intake/Output Summary (Last 24 hours) at 02/23/2021 0659 Last data filed at 02/23/2021 0626 Gross per 24 hour  Intake 240 ml  Output 3625 ml  Net -3385 ml   Last 3 Weights 02/23/2021 02/22/2021 02/21/2021  Weight (lbs) 171 lb 1.6 oz 170 lb 3.2 oz 170 lb 12.8 oz  Weight (kg) 77.61 kg 77.202 kg 77.474 kg      Telemetry    Sinus  - Personally Reviewed  ECG    No new tracings - Personally Reviewed  Physical Exam   GEN: No acute distress.   Neck: No JVD Cardiac: RRR, no murmurs, rubs, or gallops.  Respiratory: scattered wheezing. GI: Soft, nontender, non-distended  MS: No edema; No deformity. Neuro:  Nonfocal  Psych: Normal affect   Labs    High Sensitivity Troponin:   Recent Labs  Lab  02/20/21 0313 02/20/21 0513  TROPONINIHS 36* 73*      Chemistry Recent Labs  Lab 02/20/21 0313 02/21/21 0403 02/22/21 0335 02/23/21 0401  NA 142 137  139 142 139  K 3.1* 3.9  3.9 3.9 3.8  CL 103 102  103 102 100  CO2 27 24  25 28 28   GLUCOSE 170* 216*  214* 114* 113*  BUN 47* 61*  62* 64* 63*  CREATININE 1.64* 1.83*  1.74* 1.81* 1.68*  CALCIUM 9.2 8.6*  8.7* 8.6* 8.5*  PROT 7.6 6.6  --   --   ALBUMIN 3.8 3.2*  --   --   AST 37 29  --   --   ALT 44 33  --   --   ALKPHOS 88 65  --   --   BILITOT 1.3* 1.0  --   --   GFRNONAA 39* 35*  37* 35* 38*  ANIONGAP 12 11  11 12 11      Hematology Recent Labs  Lab 02/21/21 0403 02/22/21 0335 02/23/21 0401  WBC 9.7 11.2* 8.9  RBC 3.06* 3.02* 3.07*  HGB  9.9* 9.6* 9.8*  HCT 30.5* 30.6* 30.6*  MCV 99.7 101.3* 99.7  MCH 32.4 31.8 31.9  MCHC 32.5 31.4 32.0  RDW 14.0 14.0 13.8  PLT 127* 140* 133*    BNP Recent Labs  Lab 02/20/21 0313  BNP 1,833.3*     DDimer No results for input(s): DDIMER in the last 168 hours.   Radiology    ECHOCARDIOGRAM COMPLETE  Result Date: 02/21/2021    ECHOCARDIOGRAM REPORT   Patient Name:   Andrew [name] Date of Exam: 02/21/2021 Medical Rec #:  [id number]        Height:       67.0 in Accession #:    [id number]       Weight:       170.8 lb Date of Birth:  [birth year]        BSA:          1.891 m Patient Age:    50 years         BP:           111/93 mmHg Patient Gender: M                HR:           58 bpm. Exam Location:  Inpatient Procedure: 2D Echo Indications:    dyspnea  History:        Patient has prior history of Echocardiogram examinations, most                 recent 09/15/2020. Prior CABG, COPD and chronic kidney disease,                 Signs/Symptoms:Shortness of Breath; Risk Factors:Diabetes and                 Dyslipidemia.  Sonographer:    Johny Chess Referring Phys: [id number] Arab  1. Global hypokinesis with akinesis of the inferolateral walls;  overall severe LV dysfunction.  2. Left ventricular ejection fraction, by estimation, is 25 to 30%. The left ventricle has severely decreased function. The left ventricle demonstrates regional wall motion abnormalities (see scoring diagram/findings for description). The left ventricular internal cavity size was mildly dilated. Left ventricular diastolic parameters are indeterminate. Elevated left atrial pressure.  3. Right ventricular systolic function is normal. The right ventricular size is normal.  4. Left atrial size was severely dilated.  5. The mitral valve is normal in structure. Mild mitral valve regurgitation. No evidence of mitral stenosis. Moderate mitral annular calcification.  6. The aortic valve is tricuspid. Aortic valve regurgitation is not visualized. No aortic stenosis is present.  7. The inferior vena cava is dilated in size with <50% respiratory variability, suggesting right atrial pressure of 15 mmHg. FINDINGS  Left Ventricle: Left ventricular ejection fraction, by estimation, is 25 to 30%. The left ventricle has severely decreased function. The left ventricle demonstrates regional wall motion abnormalities. The left ventricular internal cavity size was mildly  dilated. There is no left ventricular hypertrophy. Left ventricular diastolic parameters are indeterminate. Elevated left atrial pressure. Right Ventricle: The right ventricular size is normal.Right ventricular systolic function is normal. Left Atrium: Left atrial size was severely dilated. Right Atrium: Right atrial size was normal in size. Pericardium: There is no evidence of pericardial effusion. Mitral Valve: The mitral valve is normal in structure. Moderate mitral annular calcification. Mild mitral valve regurgitation. No evidence of mitral valve stenosis. MV peak gradient, 9.4 mmHg. The mean mitral valve gradient is 3.0  mmHg. Tricuspid Valve: The tricuspid valve is normal in structure. Tricuspid valve regurgitation is trivial. No  evidence of tricuspid stenosis. Aortic Valve: The aortic valve is tricuspid. Aortic valve regurgitation is not visualized. No aortic stenosis is present. Pulmonic Valve: The pulmonic valve was normal in structure. Pulmonic valve regurgitation is trivial. No evidence of pulmonic stenosis. Aorta: The aortic root is normal in size and structure. Venous: The inferior vena cava is dilated in size with less than 50% respiratory variability, suggesting right atrial pressure of 15 mmHg. IAS/Shunts: The interatrial septum was not well visualized. Additional Comments: Global hypokinesis with akinesis of the inferolateral walls; overall severe LV dysfunction.  LEFT VENTRICLE PLAX 2D LVIDd:         5.60 cm      Diastology LVIDs:         4.90 cm      LV e' medial:    3.59 cm/s LV PW:         0.90 cm      LV E/e' medial:  42.9 LV IVS:        0.90 cm      LV e' lateral:   4.79 cm/s LVOT diam:     2.10 cm      LV E/e' lateral: 32.2 LV SV:         63 LV SV Index:   34 LVOT Area:     3.46 cm  LV Volumes (MOD) LV vol d, MOD A2C: 118.0 ml LV vol d, MOD A4C: 140.0 ml LV vol s, MOD A2C: 79.6 ml LV vol s, MOD A4C: 69.7 ml LV SV MOD A2C:     38.4 ml LV SV MOD A4C:     140.0 ml LV SV MOD BP:      54.5 ml RIGHT VENTRICLE            IVC RV S prime:     9.25 cm/s  IVC diam: 2.40 cm TAPSE (M-mode): 1.5 cm LEFT ATRIUM            Index       RIGHT ATRIUM          Index LA diam:      4.90 cm  2.59 cm/m  RA Area:     9.98 cm LA Vol (A2C): 104.0 ml 55.00 ml/m RA Volume:   18.10 ml 9.57 ml/m  AORTIC VALVE LVOT Vmax:   70.90 cm/s LVOT Vmean:  50.000 cm/s LVOT VTI:    0.183 m  AORTA Ao Root diam: 3.30 cm MITRAL VALVE MV Area (PHT): 3.06 cm     SHUNTS MV Area VTI:   1.38 cm     Systemic VTI:  0.18 m MV Peak grad:  9.4 mmHg     Systemic Diam: 2.10 cm MV Mean grad:  3.0 mmHg MV Vmax:       1.53 m/s MV Vmean:      80.7 cm/s MV Decel Time: 248 msec MV E velocity: 154.00 cm/s MV A velocity: 95.50 cm/s MV E/A ratio:  1.61 Kirk Ruths MD  Electronically signed by Kirk Ruths MD Signature Date/Time: 02/21/2021/11:27:26 AM    Final     Cardiac Studies   Echocardiogram 02/21/21: 1. Global hypokinesis with akinesis of the inferolateral walls; overall  severe LV dysfunction.   2. Left ventricular ejection fraction, by estimation, is 25 to 30%. The  left ventricle has severely decreased function. The left ventricle  demonstrates regional wall motion abnormalities (see scoring  diagram/findings for  description). The left  ventricular internal cavity size was mildly dilated. Left ventricular  diastolic parameters are indeterminate. Elevated left atrial pressure.   3. Right ventricular systolic function is normal. The right ventricular  size is normal.   4. Left atrial size was severely dilated.   5. The mitral valve is normal in structure. Mild mitral valve  regurgitation. No evidence of mitral stenosis. Moderate mitral annular  calcification.   6. The aortic valve is tricuspid. Aortic valve regurgitation is not  visualized. No aortic stenosis is present.   7. The inferior vena cava is dilated in size with <50% respiratory  variability, suggesting right atrial pressure of 15 mmHg.    Echocardiogram 08/2020: 1. Mild global hypokinesis worse in the anterolateral myocardium. Left  ventricular ejection fraction, by estimation, is 40 to 45%. The left  ventricle has mildly decreased function. The left ventricle demonstrates  regional wall motion abnormalities (see  scoring diagram/findings for description). The left ventricular internal  cavity size was mildly dilated. Left ventricular diastolic parameters are  consistent with Grade I diastolic dysfunction (impaired relaxation).   2. Right ventricular systolic function is normal. The right ventricular  size is normal. There is mildly elevated pulmonary artery systolic  pressure.   3. Left atrial size was moderately dilated.   4. Mild mitral stenosis at 73 bpm. The mitral valve  is degenerative.  Trivial mitral valve regurgitation. Mild mitral stenosis. The mean mitral  valve gradient is 7.0 mmHg. Moderate mitral annular calcification.   5. The aortic valve is tricuspid. There is mild calcification of the  aortic valve. Aortic valve regurgitation is not visualized. No aortic  stenosis is present.   6. The inferior vena cava is dilated in size with <50% respiratory  variability, suggesting right atrial pressure of 15 mmHg.   Patient Profile     87 y.o. male with a PMH of CAD s/p CABG, chronic combined CHF, HTN, HLD, paroxysmal atrial fibrillation, carotid artery stenosis, PAD, AAA s/p repair, DM type 2, CKD stage 3, and bronchiectasis, who is being followed by cardiology for the evaluated of acute on chronic combined CHF.  Assessment & Plan    1. Acute on chronic combined CHF: patient presented with SOB. BNP elevated to 1800 and CXR showed bibasilar opacities c/w atelectasis vs infection. Echo showed EF 25-30% (previously 40-45% 08/2020) with global hypokinesis and akinesis of the inferolateral walls (prior anterolateral WMA 08/2020), indeterminate LV diastolic function, normal RV size/function, severe LAE, and mild MR. He was started on IV lasix 60mg  BID with UOP -3.6 L in the past 24 hours and -6L this admission (suspect incomplete intake documentation). Weight is down to 171.1lbs today from 170.2lbs yesterday, though overall down from 178lbs on admission. Cr appears to be about baseline at 1.6, though limits GDMT, in addition to bradycardia. BP is intermittently soft but overall stable. He feels back to baseline. He continues to require O2 via [place].  - Would try to wean off O2 - Suspect he can transition to oral diuretic today - Continue carvedilol at reduced dose given bradycardia - Continue hydralazine and imdur - Continue to monitor strict I&Os and daily weights - Continue to monitor electrolytes and replete as needed to maintain K >4, Mg >2   2. CAD s/p CABG: echo  this admission with reduction in LV from 40-45% 08/2020 to 25-30% this admission. RWMA noted with global hypokinesis and akinesis of the inferolateral walls, previously noted to be anterolateral WMA. HsTrop 36>73 this admission; low flat  trend not c/w ACS. Not on aspirin due to need for anticoagulation. - Favor ongoing medical management - Continue statin  - Continue imdur and BBlocker   3. Paroxysmal atrial fibrillation: no occurrences on telemetry overnight. He has baseline bradycardia but no significant pauses or missed beats - Continue carvedilol for rate control - Continue amiodarone for rhythm control - Continue apixaban for stroke ppx - dose reduced for age and Cr >1.5   4. HTN: BP stable  - Managed in the context of #1   5. CKD stage 3: Cr stable at 1.8 which appears to be within his baseline range - Continue to monitor closely with diuresis       Hospital follow-up arranged at [facility] for 03/09/21 at 12pm. AVS updated.      For questions or updates, please contact [place] Please consult [email or web address] for contact info under        Signed, Abigail Butts, PA-C  02/23/2021, 6:59 AM    3/15 at 12pm

## 2021-02-23 NOTE — Progress Notes (Signed)
PROGRESS NOTE    Andrew [name]  PYP:[id number] DOB: [birth year] DOA: 02/20/2021 PCP: [name], MD    Chief Complaint  Patient presents with  . Shortness of Breath    Brief Narrative:  Andrew [name] a 87 y.o.malewith medical history significant forCOPD, chronic combined systolic and diastolic CHF, chronic kidney disease stage IIIb, hypertension, hypothyroidism, history of CVA, PAD, and atrial fibrillation on Eliquis, now presenting to the emergency department with progressive shortness of breath. Patient reports insidious development of worsening shortness of breath over the past 2 to 3 days, denies any fevers, cough, wheezing, or leg swelling. Reports that breathing is worse when the lays down. Upon arrival to the ED, patient is found to be afebrile, saturating low to mid 90s on 4 L/min of supplemental oxygen, mildly tachypneic, slightly tachycardic, and with blood pressure 160/90. Chest x-ray notable for hazy airspace opacities in the bases.  He was admitted for acute hypoxic respiratory failure sec to acute systolic and diastolic heart failure.  He was started on IV lasix, continue to monitor, diuresed 6.1 lit since admission. Creatinine stable on IV lasix.  His coreg was decreased to 6.25 mg BID. due to bradycardia.  Pt seen and examined today , reports breathing better today, But not yet back to baseline.  Called his wife yesterday and today, unable to leave messages as mail box is full.  PT evaluation recommending HOME health PT/OT, which have been ordered.   Assessment & Plan:   Principal Problem:   Acute respiratory failure with hypoxia (HCC) Active Problems:   Hypothyroidism   CAD (coronary artery disease)   DM (diabetes mellitus), type 2 with renal complications (HCC)   Chronic kidney disease, stage 3b (HCC)   PVD (peripheral vascular disease) (HCC)   Acute on chronic combined systolic and diastolic CHF (congestive heart failure) (HCC)   PAF  (paroxysmal atrial fibrillation) (HCC)   COPD (chronic obstructive pulmonary disease) (HCC)   History of CVA (cerebrovascular accident)   Acute respiratory failure with hypoxia secondary to acute on chronic systolic and diastolic heart failure Patient is initially requiring up to 4 L of nasal cannula oxygen to keep sats greater than 90%., weaned oxygen to 2lit of [place]/min. Plan to check his ambulating oxygen levels on discharge.  Diuresis with IV Lasix 60 mg twice daily, diuresed about 6.1 lit since admission.  Continue with strict intake and output and fluid restriction.  Daily weights are not accurate at this time. Echocardiogram showed Global hypokinesis with akinesis of the inferolateral walls; overall  severe LV dysfunction. Left ventricular ejection fraction, by estimation, is 25 to 30%. The  left ventricle has severely decreased function. The left ventricle  demonstrates regional wall motion abnormalities Appreciate cardiology recommendations.  Pt continues to be dyspneic with minimal exertion, on getting out of chair , desaturated to 77% on RA , his sats slowly came up to 91% after putting him on 2lit of [place] oxygen and 93 to 94% on 3 lit of [place] oxygen at rest.  PT evaluation recommending home health PT/OT.  Recommend to continue with IV lasix for another 24 hours and check ambulating oxygen with PT tomorrow .     CAD: Pt denies any chest pain at this time.  Continue with coreg, lipitor and eliquis.    COPD No wheezing heard. On bronchodilators.  Resume incruse.     Stage IIIb CKD Creatinine stable on IV lasix.     Type 2 diabetes mellitus with renal complications CBG (last  3)  Recent Labs    02/22/21 2031 02/23/21 0728 02/23/21 1152  GLUCAP 155* 109* 150*   Continue with SSI, no changes in meds.  Hemoglobin A1c is 7.3.    Hypothyroidism Continue with Synthroid at this time.   Paroxysmal atrial fibrillation Rate controlled with amiodarone, on anticoagulation with  Eliquis for  stroke prophylaxis.     History of peripheral vascular disease Continue with Eliquis and Lipitor.    Hypertension BP parameters are optimal.     Hyperlipidemia Continue with the Lipitor.  Anemia of chronic disease hemoglobin around 9 and stable.  Continue to monitor   Leukocytosis Unclear etiology probably reactive. Resolved on morning labs today.     DVT prophylaxis: Eliquis.  Code Status: full code.  Family Communication: none at bedside. calledhis wife several times, couldn't reach her, unable to leave a message.  Disposition:   Status is: Inpatient  Remains inpatient appropriate because:IV treatments appropriate due to intensity of illness or inability to take PO   Dispo: The patient is from: Home              Anticipated d/c is to: Home              Patient currently is not medically stable to d/c.   Difficult to place patient No       Level of care: Telemetry Consultants:   Cardiology.   Procedures: none.  Antimicrobials: none.   Subjective: Reports feeling good, alert and oriented to place and person.   Objective: Vitals:   02/23/21 0804 02/23/21 1100 02/23/21 1101 02/23/21 1102  BP: (!) 126/51     Pulse: (!) 54     Resp: 18     Temp:      TempSrc:      SpO2: 96% (!) 77% 91% 94%  Weight:      Height:        Intake/Output Summary (Last 24 hours) at 02/23/2021 1206 Last data filed at 02/23/2021 0800 Gross per 24 hour  Intake 240 ml  Output 3775 ml  Net -3535 ml   Filed Weights   02/21/21 0409 02/22/21 0500 02/23/21 0426  Weight: 77.5 kg 77.2 kg 77.6 kg    Examination:  General exam:alert, in good spirits, on 3  lit of [place] oxygen.  Respiratory system: diminished at bases, tachypnea on exertion, on 3 lit of  oxygen.  Cardiovascular system: S1S2, No JVD, no pedal edema,  Gastrointestinal system: Abdomen is soft, non tender non distended, bowel sounds wnl.  Central nervous system: Alert and oriented to place and  person. Able to move all extremities.  Extremities: No cyanosis.  Skin: No rashes seen.  Psychiatry: Mood is appropriate.     Data Reviewed: I have personally reviewed following labs and imaging studies  CBC: Recent Labs  Lab 02/20/21 0313 02/21/21 0403 02/22/21 0335 02/23/21 0401  WBC 15.3* 9.7 11.2* 8.9  NEUTROABS 11.0*  --   --   --   HGB 11.8* 9.9* 9.6* 9.8*  HCT 37.4* 30.5* 30.6* 30.6*  MCV 101.1* 99.7 101.3* 99.7  PLT 176 127* 140* 133*    Basic Metabolic Panel: Recent Labs  Lab 02/20/21 0313 02/21/21 0403 02/22/21 0335 02/23/21 0401  NA 142 137  139 142 139  K 3.1* 3.9  3.9 3.9 3.8  CL 103 102  103 102 100  CO2 27 24  25 28 28   GLUCOSE 170* 216*  214* 114* 113*  BUN 47* 61*  62* 64* 63*  CREATININE 1.64* 1.83*  1.74* 1.81* 1.68*  CALCIUM 9.2 8.6*  8.7* 8.6* 8.5*  MG  --  2.7*  --   --     GFR: Estimated Creatinine Clearance: 27.3 mL/min (A) (by C-G formula based on SCr of 1.68 mg/dL (H)).  Liver Function Tests: Recent Labs  Lab 02/20/21 0313 02/21/21 0403  AST 37 29  ALT 44 33  ALKPHOS 88 65  BILITOT 1.3* 1.0  PROT 7.6 6.6  ALBUMIN 3.8 3.2*    CBG: Recent Labs  Lab 02/22/21 1122 02/22/21 1613 02/22/21 2031 02/23/21 0728 02/23/21 1152  GLUCAP 194* 194* 155* 109* 150*     Recent Results (from the past 240 hour(s))  Resp Panel by RT-PCR (Flu A&B, Covid) Nasopharyngeal Swab     Status: None   Collection Time: 02/20/21  3:34 AM   Specimen: Nasopharyngeal Swab; Nasopharyngeal(NP) swabs in vial transport medium  Result Value Ref Range Status   SARS Coronavirus 2 by RT PCR NEGATIVE NEGATIVE Final    Comment: (NOTE) SARS-CoV-2 target nucleic acids are NOT DETECTED.  The SARS-CoV-2 RNA is generally detectable in upper respiratory specimens during the acute phase of infection. The lowest concentration of SARS-CoV-2 viral copies this assay can detect is 138 copies/mL. A negative result does not preclude SARS-Cov-2 infection and  should not be used as the sole basis for treatment or other patient management decisions. A negative result may occur with  improper specimen collection/handling, submission of specimen other than nasopharyngeal swab, presence of viral mutation(s) within the areas targeted by this assay, and inadequate number of viral copies(<138 copies/mL). A negative result must be combined with clinical observations, patient history, and epidemiological information. The expected result is Negative.  Fact Sheet for Patients:  EntrepreneurPulse.com.au  Fact Sheet for Healthcare Providers:  IncredibleEmployment.be  This test is no t yet approved or cleared by the [place] FDA and  has been authorized for detection and/or diagnosis of SARS-CoV-2 by FDA under an Emergency Use Authorization (EUA). This EUA will remain  in effect (meaning this test can be used) for the duration of the COVID-19 declaration under Section 564(b)(1) of the Act, 21 U.S.C.section 360bbb-3(b)(1), unless the authorization is terminated  or revoked sooner.       Influenza A by PCR NEGATIVE NEGATIVE Final   Influenza B by PCR NEGATIVE NEGATIVE Final    Comment: (NOTE) The Xpert Xpress SARS-CoV-2/FLU/RSV plus assay is intended as an aid in the diagnosis of influenza from Nasopharyngeal swab specimens and should not be used as a sole basis for treatment. Nasal washings and aspirates are unacceptable for Xpert Xpress SARS-CoV-2/FLU/RSV testing.  Fact Sheet for Patients: EntrepreneurPulse.com.au  Fact Sheet for Healthcare Providers: IncredibleEmployment.be  This test is not yet approved or cleared by the [place] FDA and has been authorized for detection and/or diagnosis of SARS-CoV-2 by FDA under an Emergency Use Authorization (EUA). This EUA will remain in effect (meaning this test can be used) for the duration of the COVID-19 declaration  under Section 564(b)(1) of the Act, 21 U.S.C. section 360bbb-3(b)(1), unless the authorization is terminated or revoked.  Performed at [facility], [place] [address], [place], [place] [address]          Radiology Studies: No results found.      Scheduled Meds: . amiodarone  100 mg Oral Daily  . apixaban  2.5 mg Oral BID  . atorvastatin  80 mg Oral QPM  . carvedilol  6.25 mg Oral BID WC  .  FLUoxetine  20 mg Oral Daily  . furosemide  60 mg Intravenous Q12H  . gabapentin  100 mg Oral TID  . hydrALAZINE  50 mg Oral TID  . insulin aspart  0-9 Units Subcutaneous TID WC  . isosorbide mononitrate  30 mg Oral Daily  . levothyroxine  100 mcg Oral QAC breakfast  . pantoprazole  40 mg Oral BID  . potassium chloride SA  20 mEq Oral Daily   Continuous Infusions: . sodium chloride       LOS: 3 days        Hosie Poisson, MD [facility]   To contact the attending provider between 7A-7P or the covering provider during after hours 7P-7A, please log into the web site [email or web address] and access using universal [place] password for that web site. If you do not have the password, please call the hospital operator.  02/23/2021, 12:06 PM

## 2021-02-23 NOTE — Progress Notes (Signed)
Physical Therapy Treatment Patient Details Name: Andrew [name] MRN: [id number] DOB: 27-Jan-1928 Today's Date: 02/23/2021    History of Present Illness 87 y.o. male admitted to [facility] on 2/26 with progressive ShOB over past 3 days, CXR bibasilar opacities, atelectsis vs infection. Workup for acute respiratory failure secondary to acute on chronic HF. PMH includes COPD, chronic combined systolic and diastolic CHF, chronic kidney disease stage IIIb, AAA with repair 2002, fall in the past 12 months with head strike, CAD with history of stenting, melanoma, DM, hypertension, hypothyroidism, history of CVA, PAD, and atrial fibrillation.    PT Comments    Pt continuing to require min assist for bed mobility, transfers during session today, secondary to LE weakness. Pt maintained SpO2 89% and greater on RA during 100 ft ambulation with RW, RN notified. Pt tolerated limited ROM squats to improve LE strength and activity tolerance. PT to continue to follow acutely.  SATURATION QUALIFICATIONS: (This note is used to comply with regulatory documentation for home oxygen)  Patient Saturations on Room Air at Rest = 97%  Patient Saturations on Room Air while Ambulating =89%  Patient Saturations on --Liters of oxygen while Ambulating = --%  Please briefly explain why patient needs home oxygen: not needed at this time     Follow Up Recommendations  Home health PT;Supervision for mobility/OOB     Equipment Recommendations  None recommended by PT    Recommendations for Other Services       Precautions / Restrictions Precautions Precautions: Fall Restrictions Weight Bearing Restrictions: No    Mobility  Bed Mobility Overal bed mobility: Needs Assistance Bed Mobility: Supine to Sit;Sit to Supine     Supine to sit: Min assist;HOB elevated Sit to supine: Min assist;HOB elevated   General bed mobility comments: min assist for trunk elevation, LE lifting into bed. very increased time to  perform.    Transfers Overall transfer level: Needs assistance Equipment used: Rolling walker (2 wheeled) Transfers: Sit to/from Stand Sit to Stand: Min guard         General transfer comment: for safety, increased time and effort to rise  Ambulation/Gait Ambulation/Gait assistance: Min guard Gait Distance (Feet): 100 Feet Assistive device: Rolling walker (2 wheeled) Gait Pattern/deviations: Step-through pattern;Decreased stride length;Shuffle;Trunk flexed Gait velocity: decr   General Gait Details: min guard for safety, verbal cuing for placement in RW, upright posture, SpO2 89-93% on RA   Stairs             Wheelchair Mobility    Modified Rankin (Stroke Patients Only)       Balance Overall balance assessment: Needs assistance;History of Falls   Sitting balance-Leahy Scale: Fair       Standing balance-Leahy Scale: Poor Standing balance comment: reliant on external support               High Level Balance Comments: Pt took about 4-5 side steps up to [facility] with assistance to move RW and Min As for steps, cues for sequencing.            Cognition Arousal/Alertness: Awake/alert Behavior During Therapy: WFL for tasks assessed/performed Overall Cognitive Status: History of cognitive impairments - at baseline                                 General Comments: Pt laughing throughout session, both appropriately and inappropriately      Exercises General Exercises - Lower Extremity Mini-Sqauts: AROM;Both;10 reps  General Comments        Pertinent Vitals/Pain Pain Assessment: No/denies pain    Home Living                      Prior Function            PT Goals (current goals can now be found in the care plan section) Acute Rehab PT Goals Patient Stated Goal: home to wife PT Goal Formulation: With patient Time For Goal Achievement: 03/08/21 Potential to Achieve Goals: Good Progress towards PT goals: Progressing  toward goals    Frequency    Min 3X/week      PT Plan      Co-evaluation              AM-PAC PT "6 Clicks" Mobility   Outcome Measure  Help needed turning from your back to your side while in a flat bed without using bedrails?: A Little Help needed moving from lying on your back to sitting on the side of a flat bed without using bedrails?: A Little Help needed moving to and from a bed to a chair (including a wheelchair)?: A Little Help needed standing up from a chair using your arms (e.g., wheelchair or bedside chair)?: A Little Help needed to walk in hospital room?: A Little Help needed climbing 3-5 steps with a railing? : A Lot 6 Click Score: 17    End of Session Equipment Utilized During Treatment: Gait belt;Oxygen (2LO2 reapplied at end of session) Activity Tolerance: Patient limited by fatigue Patient left: in bed;with call bell/phone within reach;with bed alarm set Nurse Communication: Mobility status;Other (comment) (O2 requirements) PT Visit Diagnosis: Other abnormalities of gait and mobility (R26.89);Difficulty in walking, not elsewhere classified (R26.2);History of falling (Z91.81)     Time: 1600-1620 PT Time Calculation (min) (ACUTE ONLY): 20 min  Charges:  $Gait Training: 8-22 mins                    Stacie Glaze, PT Acute Rehabilitation Services Pager [phone]  Office [phone]  Louis Matte 02/23/2021, 5:02 PM

## 2021-02-23 NOTE — Care Management Important Message (Signed)
Important Message  Patient Details IM Letter given to the Patient. Name: NYEEM STOKE MRN: [id number] Date of Birth: 1928/09/08   Medicare Important Message Given:  Yes     Kerin Salen 02/23/2021, 10:47 AM

## 2021-02-23 NOTE — Progress Notes (Signed)
PROGRESS NOTE    Andrew [name]  HWE:[id number] DOB: 03/22/1928 DOA: 02/20/2021 PCP: [name], MD    Chief Complaint  Patient presents with  . Shortness of Breath    Brief Narrative:  Andrew [name] a 87 y.o.malewith medical history significant forCOPD, chronic combined systolic and diastolic CHF, chronic kidney disease stage IIIb, hypertension, hypothyroidism, history of CVA, PAD, and atrial fibrillation on Eliquis, now presenting to the emergency department with progressive shortness of breath. Patient reports insidious development of worsening shortness of breath over the past 2 to 3 days, denies any fevers, cough, wheezing, or leg swelling. Reports that breathing is worse when the lays down. Upon arrival to the ED, patient is found to be afebrile, saturating low to mid 90s on 4 L/min of supplemental oxygen, mildly tachypneic, slightly tachycardic, and with blood pressure 160/90. Chest x-ray notable for hazy airspace opacities in the bases.  He was admitted for acute hypoxic respiratory failure sec to acute systolic and diastolic heart failure.  He was started on IV lasix, continue to monitor, diuresed 6.1 lit since admission. Creatinine stable on IV lasix.  His coreg was decreased due to bradycardia.  Pt seen and examined today , reports breathing better today, started weaning his oxygen, currently on 2 lit of [place] oxygen. Called his wife yesterday and today, unable to leave messages as mail box is full.  PT evaluation recommending HOME health PT/OT, which have been ordered.   Assessment & Plan:   Principal Problem:   Acute respiratory failure with hypoxia (HCC) Active Problems:   Hypothyroidism   CAD (coronary artery disease)   DM (diabetes mellitus), type 2 with renal complications (HCC)   Chronic kidney disease, stage 3b (HCC)   PVD (peripheral vascular disease) (HCC)   Acute on chronic combined systolic and diastolic CHF (congestive heart failure) (HCC)    PAF (paroxysmal atrial fibrillation) (HCC)   COPD (chronic obstructive pulmonary disease) (HCC)   History of CVA (cerebrovascular accident)   Acute respiratory failure with hypoxia secondary to acute on chronic systolic and diastolic heart failure Patient is initially requiring up to 4 L of nasal cannula oxygen to keep sats greater than 90%., weaned oxygen to 2lit of [place]/min. Plan to check his ambulating oxygen levels on discharge.  Diuresis with IV Lasix 60 mg twice daily, diuresed about 6.1 lit since admission.  Continue with strict intake and output and fluid restriction.  Daily weights are not accurate at this time. Echocardiogram showed Global hypokinesis with akinesis of the inferolateral walls; overall  severe LV dysfunction. Left ventricular ejection fraction, by estimation, is 25 to 30%. The  left ventricle has severely decreased function. The left ventricle  demonstrates regional wall motion abnormalities Appreciate cardiology recommendations.  PT evaluation recommending home health PT/OT.   CAD: Pt denies any chest pain at this time.  Continue with coreg, lipitor and eliquis.    COPD No wheezing heard. On bronchodilators.  Resume incruse.     Stage IIIb CKD Creatinine stable on IV lasix.      Type 2 diabetes mellitus with renal complications CBG (last 3)  Recent Labs    02/22/21 1613 02/22/21 2031 02/23/21 0728  GLUCAP 194* 155* 109*   Continue with SSI, no changes in meds.  Hemoglobin A1c is 7.3.    Hypothyroidism Continue with Synthroid at this time.   Paroxysmal atrial fibrillation Rate controlled with amiodarone, on anticoagulation with Eliquis for  stroke prophylaxis.     History of peripheral vascular disease  Continue with Eliquis and Lipitor.    Hypertension BP parameters are optimal.     Hyperlipidemia Continue with the Lipitor.  Anemia of chronic disease hemoglobin around 9 and stable.  Continue to  monitor   Leukocytosis Unclear etiology probably reactive. Resolved on morning labs today.     DVT prophylaxis: Eliquis.  Code Status: full code.  Family Communication: none at bedside. calledhis wife several times, couldn't reach her, unable to leave a message.  Disposition:   Status is: Inpatient  Remains inpatient appropriate because:IV treatments appropriate due to intensity of illness or inability to take PO   Dispo: The patient is from: Home              Anticipated d/c is to: Home              Patient currently is not medically stable to d/c.   Difficult to place patient No       Level of care: Telemetry Consultants:   Cardiology.   Procedures: none.  Antimicrobials: none.   Subjective: Reports feeling good, alert and oriented to place and person.   Objective: Vitals:   02/22/21 2027 02/23/21 0415 02/23/21 0426 02/23/21 0804  BP: 128/63 118/62  (!) 126/51  Pulse: 69 (!) 50  (!) 54  Resp: 20 20  18   Temp: 98.4 F (36.9 C) 98.4 F (36.9 C)    TempSrc: Oral Oral    SpO2: 96% 94%  96%  Weight:   77.6 kg   Height:   5\' 7"  (1.702 m)     Intake/Output Summary (Last 24 hours) at 02/23/2021 0851 Last data filed at 02/23/2021 0800 Gross per 24 hour  Intake 240 ml  Output 3775 ml  Net -3535 ml   Filed Weights   02/21/21 0409 02/22/21 0500 02/23/21 0426  Weight: 77.5 kg 77.2 kg 77.6 kg    Examination:  General exam:alert, in good spirits, on 2 lit of [place] oxygen.  Respiratory system: air entry fair bilateral,  No wheezing heard, on 2lit of [place] oxygen.  Cardiovascular system: S1S2, No JVD, no pedal edema,  Gastrointestinal system: Abdomen is soft, non tender non distended, bowel sounds wnl.  Central nervous system: Alert and oriented to place and person. Able to move all extremities.  Extremities: No cyanosis.  Skin: No rashes seen.  Psychiatry: Mood is appropriate.     Data Reviewed: I have personally reviewed following labs and imaging  studies  CBC: Recent Labs  Lab 02/20/21 0313 02/21/21 0403 02/22/21 0335 02/23/21 0401  WBC 15.3* 9.7 11.2* 8.9  NEUTROABS 11.0*  --   --   --   HGB 11.8* 9.9* 9.6* 9.8*  HCT 37.4* 30.5* 30.6* 30.6*  MCV 101.1* 99.7 101.3* 99.7  PLT 176 127* 140* 133*    Basic Metabolic Panel: Recent Labs  Lab 02/20/21 0313 02/21/21 0403 02/22/21 0335 02/23/21 0401  NA 142 137  139 142 139  K 3.1* 3.9  3.9 3.9 3.8  CL 103 102  103 102 100  CO2 27 24  25 28 28   GLUCOSE 170* 216*  214* 114* 113*  BUN 47* 61*  62* 64* 63*  CREATININE 1.64* 1.83*  1.74* 1.81* 1.68*  CALCIUM 9.2 8.6*  8.7* 8.6* 8.5*  MG  --  2.7*  --   --     GFR: Estimated Creatinine Clearance: 27.3 mL/min (A) (by C-G formula based on SCr of 1.68 mg/dL (H)).  Liver Function Tests: Recent Labs  Lab 02/20/21 8119 02/21/21  0403  AST 37 29  ALT 44 33  ALKPHOS 88 65  BILITOT 1.3* 1.0  PROT 7.6 6.6  ALBUMIN 3.8 3.2*    CBG: Recent Labs  Lab 02/22/21 0822 02/22/21 1122 02/22/21 1613 02/22/21 2031 02/23/21 0728  GLUCAP 115* 194* 194* 155* 109*     Recent Results (from the past 240 hour(s))  Resp Panel by RT-PCR (Flu A&B, Covid) Nasopharyngeal Swab     Status: None   Collection Time: 02/20/21  3:34 AM   Specimen: Nasopharyngeal Swab; Nasopharyngeal(NP) swabs in vial transport medium  Result Value Ref Range Status   SARS Coronavirus 2 by RT PCR NEGATIVE NEGATIVE Final    Comment: (NOTE) SARS-CoV-2 target nucleic acids are NOT DETECTED.  The SARS-CoV-2 RNA is generally detectable in upper respiratory specimens during the acute phase of infection. The lowest concentration of SARS-CoV-2 viral copies this assay can detect is 138 copies/mL. A negative result does not preclude SARS-Cov-2 infection and should not be used as the sole basis for treatment or other patient management decisions. A negative result may occur with  improper specimen collection/handling, submission of specimen other than  nasopharyngeal swab, presence of viral mutation(s) within the areas targeted by this assay, and inadequate number of viral copies(<138 copies/mL). A negative result must be combined with clinical observations, patient history, and epidemiological information. The expected result is Negative.  Fact Sheet for Patients:  EntrepreneurPulse.com.au  Fact Sheet for Healthcare Providers:  IncredibleEmployment.be  This test is no t yet approved or cleared by the [place] FDA and  has been authorized for detection and/or diagnosis of SARS-CoV-2 by FDA under an Emergency Use Authorization (EUA). This EUA will remain  in effect (meaning this test can be used) for the duration of the COVID-19 declaration under Section 564(b)(1) of the Act, 21 U.S.C.section 360bbb-3(b)(1), unless the authorization is terminated  or revoked sooner.       Influenza A by PCR NEGATIVE NEGATIVE Final   Influenza B by PCR NEGATIVE NEGATIVE Final    Comment: (NOTE) The Xpert Xpress SARS-CoV-2/FLU/RSV plus assay is intended as an aid in the diagnosis of influenza from Nasopharyngeal swab specimens and should not be used as a sole basis for treatment. Nasal washings and aspirates are unacceptable for Xpert Xpress SARS-CoV-2/FLU/RSV testing.  Fact Sheet for Patients: EntrepreneurPulse.com.au  Fact Sheet for Healthcare Providers: IncredibleEmployment.be  This test is not yet approved or cleared by the [place] FDA and has been authorized for detection and/or diagnosis of SARS-CoV-2 by FDA under an Emergency Use Authorization (EUA). This EUA will remain in effect (meaning this test can be used) for the duration of the COVID-19 declaration under Section 564(b)(1) of the Act, 21 U.S.C. section 360bbb-3(b)(1), unless the authorization is terminated or revoked.  Performed at [facility], [place] [address] [place], [place] [address]          Radiology Studies: ECHOCARDIOGRAM COMPLETE  Result Date: 02/21/2021    ECHOCARDIOGRAM REPORT   Patient Name:   SHARMARKE CICIO Date of Exam: 02/21/2021 Medical Rec #:  [id number]        Height:       67.0 in Accession #:    [id number]       Weight:       170.8 lb Date of Birth:  [birth year]        BSA:          1.891 m Patient Age:    25 years  BP:           111/93 mmHg Patient Gender: M                HR:           58 bpm. Exam Location:  Inpatient Procedure: 2D Echo Indications:    dyspnea  History:        Patient has prior history of Echocardiogram examinations, most                 recent 09/15/2020. Prior CABG, COPD and chronic kidney disease,                 Signs/Symptoms:Shortness of Breath; Risk Factors:Diabetes and                 Dyslipidemia.  Sonographer:    Johny Chess Referring Phys: [id number] [place]  1. Global hypokinesis with akinesis of the inferolateral walls; overall severe LV dysfunction.  2. Left ventricular ejection fraction, by estimation, is 25 to 30%. The left ventricle has severely decreased function. The left ventricle demonstrates regional wall motion abnormalities (see scoring diagram/findings for description). The left ventricular internal cavity size was mildly dilated. Left ventricular diastolic parameters are indeterminate. Elevated left atrial pressure.  3. Right ventricular systolic function is normal. The right ventricular size is normal.  4. Left atrial size was severely dilated.  5. The mitral valve is normal in structure. Mild mitral valve regurgitation. No evidence of mitral stenosis. Moderate mitral annular calcification.  6. The aortic valve is tricuspid. Aortic valve regurgitation is not visualized. No aortic stenosis is present.  7. The inferior vena cava is dilated in size with <50% respiratory variability, suggesting right atrial pressure of 15 mmHg. FINDINGS  Left Ventricle: Left ventricular  ejection fraction, by estimation, is 25 to 30%. The left ventricle has severely decreased function. The left ventricle demonstrates regional wall motion abnormalities. The left ventricular internal cavity size was mildly  dilated. There is no left ventricular hypertrophy. Left ventricular diastolic parameters are indeterminate. Elevated left atrial pressure. Right Ventricle: The right ventricular size is normal.Right ventricular systolic function is normal. Left Atrium: Left atrial size was severely dilated. Right Atrium: Right atrial size was normal in size. Pericardium: There is no evidence of pericardial effusion. Mitral Valve: The mitral valve is normal in structure. Moderate mitral annular calcification. Mild mitral valve regurgitation. No evidence of mitral valve stenosis. MV peak gradient, 9.4 mmHg. The mean mitral valve gradient is 3.0 mmHg. Tricuspid Valve: The tricuspid valve is normal in structure. Tricuspid valve regurgitation is trivial. No evidence of tricuspid stenosis. Aortic Valve: The aortic valve is tricuspid. Aortic valve regurgitation is not visualized. No aortic stenosis is present. Pulmonic Valve: The pulmonic valve was normal in structure. Pulmonic valve regurgitation is trivial. No evidence of pulmonic stenosis. Aorta: The aortic root is normal in size and structure. Venous: The inferior vena cava is dilated in size with less than 50% respiratory variability, suggesting right atrial pressure of 15 mmHg. IAS/Shunts: The interatrial septum was not well visualized. Additional Comments: Global hypokinesis with akinesis of the inferolateral walls; overall severe LV dysfunction.  LEFT VENTRICLE PLAX 2D LVIDd:         5.60 cm      Diastology LVIDs:         4.90 cm      LV e' medial:    3.59 cm/s LV PW:         0.90 cm  LV E/e' medial:  42.9 LV IVS:        0.90 cm      LV e' lateral:   4.79 cm/s LVOT diam:     2.10 cm      LV E/e' lateral: 32.2 LV SV:         63 LV SV Index:   34 LVOT Area:      3.46 cm  LV Volumes (MOD) LV vol d, MOD A2C: 118.0 ml LV vol d, MOD A4C: 140.0 ml LV vol s, MOD A2C: 79.6 ml LV vol s, MOD A4C: 69.7 ml LV SV MOD A2C:     38.4 ml LV SV MOD A4C:     140.0 ml LV SV MOD BP:      54.5 ml RIGHT VENTRICLE            IVC RV S prime:     9.25 cm/s  IVC diam: 2.40 cm TAPSE (M-mode): 1.5 cm LEFT ATRIUM            Index       RIGHT ATRIUM          Index LA diam:      4.90 cm  2.59 cm/m  RA Area:     9.98 cm LA Vol (A2C): 104.0 ml 55.00 ml/m RA Volume:   18.10 ml 9.57 ml/m  AORTIC VALVE LVOT Vmax:   70.90 cm/s LVOT Vmean:  50.000 cm/s LVOT VTI:    0.183 m  AORTA Ao Root diam: 3.30 cm MITRAL VALVE MV Area (PHT): 3.06 cm     SHUNTS MV Area VTI:   1.38 cm     Systemic VTI:  0.18 m MV Peak grad:  9.4 mmHg     Systemic Diam: 2.10 cm MV Mean grad:  3.0 mmHg MV Vmax:       1.53 m/s MV Vmean:      80.7 cm/s MV Decel Time: 248 msec MV E velocity: 154.00 cm/s MV A velocity: 95.50 cm/s MV E/A ratio:  1.61 Kirk Ruths MD Electronically signed by Kirk Ruths MD Signature Date/Time: 02/21/2021/11:27:26 AM    Final         Scheduled Meds: . amiodarone  100 mg Oral Daily  . apixaban  2.5 mg Oral BID  . atorvastatin  80 mg Oral QPM  . carvedilol  6.25 mg Oral BID WC  . FLUoxetine  20 mg Oral Daily  . furosemide  60 mg Intravenous Q12H  . gabapentin  100 mg Oral TID  . hydrALAZINE  50 mg Oral TID  . insulin aspart  0-9 Units Subcutaneous TID WC  . isosorbide mononitrate  30 mg Oral Daily  . levothyroxine  100 mcg Oral QAC breakfast  . pantoprazole  40 mg Oral BID  . potassium chloride SA  20 mEq Oral Daily   Continuous Infusions: . sodium chloride       LOS: 3 days        Hosie Poisson, MD [facility]   To contact the attending provider between 7A-7P or the covering provider during after hours 7P-7A, please log into the web site [email or web address] and access using universal [place] password for that web site. If you do not have the password, please call the  hospital operator.  02/23/2021, 8:51 AM

## 2021-02-24 LAB — BASIC METABOLIC PANEL
Anion gap: 10 (ref 5–15)
BUN: 60 mg/dL — ABNORMAL HIGH (ref 8–23)
CO2: 28 mmol/L (ref 22–32)
Calcium: 8.4 mg/dL — ABNORMAL LOW (ref 8.9–10.3)
Chloride: 98 mmol/L (ref 98–111)
Creatinine, Ser: 1.54 mg/dL — ABNORMAL HIGH (ref 0.61–1.24)
GFR, Estimated: 43 mL/min — ABNORMAL LOW (ref >60)
Glucose, Bld: 107 mg/dL — ABNORMAL HIGH (ref 70–99)
Potassium: 3.6 mmol/L (ref 3.5–5.1)
Sodium: 136 mmol/L (ref 135–145)

## 2021-02-24 LAB — GLUCOSE, CAPILLARY
Glucose-Capillary: 104 mg/dL — ABNORMAL HIGH (ref 70–99)
Glucose-Capillary: 168 mg/dL — ABNORMAL HIGH (ref 70–99)

## 2021-02-24 MED ORDER — CARVEDILOL 6.25 MG PO TABS: 6.2500 mg | ORAL_TABLET | Freq: Two times a day (BID) | ORAL | 2 refills | Status: DC

## 2021-02-24 NOTE — Discharge Summary (Signed)
Physician Discharge Summary  Andrew [name] GYF:[id number] DOB: 1928-10-28 DOA: 02/20/2021  PCP: [name], MD  Admit date: 02/20/2021 Discharge date: 02/24/2021  Time spent: 50 minutes  Recommendations for Outpatient Follow-up:  1. Follow-up cardiology in 2 weeks.  Discharge Diagnoses:  Principal Problem:   Acute respiratory failure with hypoxia (HCC) Active Problems:   Hypothyroidism   CAD (coronary artery disease)   DM (diabetes mellitus), type 2 with renal complications (HCC)   Chronic kidney disease, stage 3b (HCC)   PVD (peripheral vascular disease) (HCC)   Acute on chronic combined systolic and diastolic CHF (congestive heart failure) (HCC)   PAF (paroxysmal atrial fibrillation) (HCC)   COPD (chronic obstructive pulmonary disease) (HCC)   History of CVA (cerebrovascular accident)   Discharge Condition: Stable  Diet recommendation: Heart healthy diet  Filed Weights   02/21/21 0409 02/22/21 0500 02/23/21 0426  Weight: 77.5 kg 77.2 kg 77.6 kg    History of present illness:   87 year old male with a history of COPD, chronic combined [place] and diastolic CHF, CKD stage IIIb, hypertension, hypothyroidism, history of CVA, PAD, atrial fibrillation on Eliquis presented to ED with complaints of progressive shortness of breath.  He was found to be in acute systolic and diastolic heart failure.  He was admitted with acute hypoxemic respiratory failure.  Started on IV Lasix.  Hospital Course:   Acute hypoxemic respiratory failure-secondary to acute on chronic systolic and diastolic heart failure.  Patient was started on diuresis with Lasix.  Diuresed 6.1 L since admission.  Echocardiogram showed global hypokinesis and akinesis of the inferolateral walls.  EF was 25 to 30%.  Cardiology was consulted.  Ambulatory oxygen test was performed and patient O2 sats dropped to 89% on room air.  Does not require oxygen at this time.  Home medications have been adjusted as per cardiology  recommendations.  Patient to continue taking torsemide 80 mg in the morning and 40 mg in the evening.  Metolazone to be taken as per recommendation by heart failure clinic.  CAD-denies chest pain, continue Coreg, Lipitor, Eliquis.  History of COPD-no exacerbation, continue bronchodilators as needed.  History of CKD stage IIIb-creatinine stable  Diabetes mellitus type 2-CBG well controlled.  Hypothyroidism-continue Synthroid  Paroxysmal atrial fibrillation-heart rate is controlled, continue amiodarone, anticoagulation with Eliquis.  History of peripheral arterial disease-continue Eliquis, Lipitor.  Cilostazol has been discontinued per cardiology due to heart failure.  Procedures:  Echocardiogram  Consultations:  Cardiology  Discharge Exam: Vitals:   02/23/21 2134 02/24/21 0435  BP: 126/63 122/65  Pulse: (!) 106 (!) 55  Resp: 16 18  Temp: 97.9 F (36.6 C) 97.9 F (36.6 C)  SpO2: 96% 97%    General: Appears in no acute distress Cardiovascular: S1-S2, regular, no murmur auscultated Respiratory: Clear to auscultation bilaterally  Discharge Instructions   Discharge Instructions    Diet - low sodium heart healthy   Complete by: As directed    Increase activity slowly   Complete by: As directed      Allergies as of 02/24/2021      Reactions   Metoprolol Shortness Of Breath, Palpitations, Other (See Comments)   Heart starts racing. Shallow breathing; pt states he also get skin irritation on his legs   Metformin Hives, Swelling   On legs   Metformin And Related Nausea And Vomiting      Medication List    STOP taking these medications   cilostazol 100 MG tablet Commonly known as: PLETAL   glucose blood  test strip Commonly known as: Accu-Chek Aviva Plus   HYDROcodone-acetaminophen 5-325 MG tablet Commonly known as: NORCO/VICODIN     TAKE these medications   acetaminophen 500 MG tablet Commonly known as: TYLENOL Take 2 tablets (1,000 mg total) by mouth  every 8 (eight) hours as needed.   amiodarone 200 MG tablet Commonly known as: PACERONE Take 0.5 tablets (100 mg total) by mouth daily.   atorvastatin 80 MG tablet Commonly known as: LIPITOR Take 1 tablet (80 mg total) by mouth daily. What changed: when to take this   carvedilol 6.25 MG tablet Commonly known as: COREG Take 1 tablet (6.25 mg total) by mouth 2 (two) times daily with a meal. What changed:   medication strength  how much to take   Eliquis 2.5 MG Tabs tablet Generic drug: apixaban TAKE 1 TABLET BY MOUTH TWICE DAILY.   empagliflozin 10 MG Tabs tablet Commonly known as: Jardiance Take 1 tablet (10 mg total) by mouth daily before breakfast.   ezetimibe 10 MG tablet Commonly known as: ZETIA TAKE (1) TABLET DAILY. What changed:   how much to take  how to take this  when to take this  additional instructions   FLUoxetine 20 MG capsule Commonly known as: PROZAC TAKE (1) CAPSULE DAILY. What changed: See the new instructions.   gabapentin 100 MG capsule Commonly known as: NEURONTIN Take 100 mg by mouth 3 (three) times daily.   hydrALAZINE 50 MG tablet Commonly known as: APRESOLINE Take 1 tablet (50 mg total) by mouth 3 (three) times daily.   isosorbide mononitrate 30 MG 24 hr tablet Commonly known as: IMDUR Take 1 tablet (30 mg total) by mouth daily.   levothyroxine 100 MCG tablet Commonly known as: SYNTHROID TAKE 1 TABLET BEFORE BREAKFAST. What changed: See the new instructions.   metolazone 2.5 MG tablet Commonly known as: ZAROXOLYN Take 1 tablet (2.5 mg total) by mouth as directed. By HF Clinic   Multivitamin Adults 50+ Tabs Take 1 tablet by mouth daily in the afternoon.   nitroGLYCERIN 0.4 MG SL tablet Commonly known as: NITROSTAT DISSOLVE 1 TABLET UNDER TONGUE AS NEEDED FOR CHEST PAIN,MAY REPEAT IN5 MINUTES FOR 2 DOSES.   pantoprazole 40 MG tablet Commonly known as: PROTONIX Take 1 tablet (40 mg total) by mouth 2 (two) times daily.    potassium chloride SA 20 MEQ tablet Commonly known as: KLOR-CON Take 20 mEq by mouth daily.   tiotropium 18 MCG inhalation capsule Commonly known as: SPIRIVA Place 18 mcg into inhaler and inhale daily as needed (for wheezing).   torsemide 20 MG tablet Commonly known as: DEMADEX Take 4 tablets (80 mg total) by mouth every morning AND 2 tablets (40 mg total) every evening.      Allergies  Allergen Reactions  . Metoprolol Shortness Of Breath, Palpitations and Other (See Comments)    Heart starts racing. Shallow breathing; pt states he also get skin irritation on his legs  . Metformin Hives and Swelling    On legs  . Metformin And Related Nausea And Vomiting    Follow-up Information    [facility] Follow up on 03/09/2021.   Specialty: Cardiology Why: Please arrive 15 minutes early for your 12pm post-hospital cardiology appointment.  Parking code: [place] information: [address] [id number] Byhalia Barnard [phone]               The results of significant diagnostics from this hospitalization (including imaging, microbiology, ancillary  and laboratory) are listed below for reference.    Significant Diagnostic Studies: DG Chest Port 1 View  Result Date: 02/20/2021 CLINICAL DATA:  Shortness of breath EXAM: PORTABLE CHEST 1 VIEW COMPARISON:  None. FINDINGS: The heart size and mediastinal contours are within normal limits. Hazy airspace opacity seen at the right lung base and the periphery of the left lung base. No pleural effusion is seen. Aortic knob calcifications are noted. Overlying median sternotomy wires are present. 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 IMPRESSION: Hazy bibasilar airspace opacities which could be due to atelectasis or infectious etiology. Electronically Signed   By: Prudencio Pair M.D.   On: 02/20/2021 03:53   ECHOCARDIOGRAM COMPLETE  Result Date: 02/21/2021     ECHOCARDIOGRAM REPORT   Patient Name:   GRANTHAM HIPPERT Date of Exam: 02/21/2021 Medical Rec #:  [id number]        Height:       67.0 in Accession #:    [id number]       Weight:       170.8 lb Date of Birth:  [birth year]        BSA:          1.891 m Patient Age:    34 years         BP:           111/93 mmHg Patient Gender: M                HR:           58 bpm. Exam Location:  Inpatient Procedure: 2D Echo Indications:    dyspnea  History:        Patient has prior history of Echocardiogram examinations, most                 recent 09/15/2020. Prior CABG, COPD and chronic kidney disease,                 Signs/Symptoms:Shortness of Breath; Risk Factors:Diabetes and                 Dyslipidemia.  Sonographer:    Johny Chess Referring Phys: [id number] [place]  1. Global hypokinesis with akinesis of the inferolateral walls; overall severe LV dysfunction.  2. Left ventricular ejection fraction, by estimation, is 25 to 30%. The left ventricle has severely decreased function. The left ventricle demonstrates regional wall motion abnormalities (see scoring diagram/findings for description). The left ventricular internal cavity size was mildly dilated. Left ventricular diastolic parameters are indeterminate. Elevated left atrial pressure.  3. Right ventricular systolic function is normal. The right ventricular size is normal.  4. Left atrial size was severely dilated.  5. The mitral valve is normal in structure. Mild mitral valve regurgitation. No evidence of mitral stenosis. Moderate mitral annular calcification.  6. The aortic valve is tricuspid. Aortic valve regurgitation is not visualized. No aortic stenosis is present.  7. The inferior vena cava is dilated in size with <50% respiratory variability, suggesting right atrial pressure of 15 mmHg. FINDINGS  Left Ventricle: Left ventricular ejection fraction, by estimation, is 25 to 30%. The left ventricle has severely decreased function. The left ventricle  demonstrates regional wall motion abnormalities. The left ventricular internal cavity size was mildly  dilated. There is no left ventricular hypertrophy. Left ventricular diastolic parameters are indeterminate. Elevated left atrial pressure. Right Ventricle: The right ventricular size is normal.Right ventricular systolic function is normal. Left Atrium: Left atrial size was severely dilated. Right Atrium:  Right atrial size was normal in size. Pericardium: There is no evidence of pericardial effusion. Mitral Valve: The mitral valve is normal in structure. Moderate mitral annular calcification. Mild mitral valve regurgitation. No evidence of mitral valve stenosis. MV peak gradient, 9.4 mmHg. The mean mitral valve gradient is 3.0 mmHg. Tricuspid Valve: The tricuspid valve is normal in structure. Tricuspid valve regurgitation is trivial. No evidence of tricuspid stenosis. Aortic Valve: The aortic valve is tricuspid. Aortic valve regurgitation is not visualized. No aortic stenosis is present. Pulmonic Valve: The pulmonic valve was normal in structure. Pulmonic valve regurgitation is trivial. No evidence of pulmonic stenosis. Aorta: The aortic root is normal in size and structure. Venous: The inferior vena cava is dilated in size with less than 50% respiratory variability, suggesting right atrial pressure of 15 mmHg. IAS/Shunts: The interatrial septum was not well visualized. Additional Comments: Global hypokinesis with akinesis of the inferolateral walls; overall severe LV dysfunction.  LEFT VENTRICLE PLAX 2D LVIDd:         5.60 cm      Diastology LVIDs:         4.90 cm      LV e' medial:    3.59 cm/s LV PW:         0.90 cm      LV E/e' medial:  42.9 LV IVS:        0.90 cm      LV e' lateral:   4.79 cm/s LVOT diam:     2.10 cm      LV E/e' lateral: 32.2 LV SV:         63 LV SV Index:   34 LVOT Area:     3.46 cm  LV Volumes (MOD) LV vol d, MOD A2C: 118.0 ml LV vol d, MOD A4C: 140.0 ml LV vol s, MOD A2C: 79.6 ml LV vol s,  MOD A4C: 69.7 ml LV SV MOD A2C:     38.4 ml LV SV MOD A4C:     140.0 ml LV SV MOD BP:      54.5 ml RIGHT VENTRICLE            IVC RV S prime:     9.25 cm/s  IVC diam: 2.40 cm TAPSE (M-mode): 1.5 cm LEFT ATRIUM            Index       RIGHT ATRIUM          Index LA diam:      4.90 cm  2.59 cm/m  RA Area:     9.98 cm LA Vol (A2C): 104.0 ml 55.00 ml/m RA Volume:   18.10 ml 9.57 ml/m  AORTIC VALVE LVOT Vmax:   70.90 cm/s LVOT Vmean:  50.000 cm/s LVOT VTI:    0.183 m  AORTA Ao Root diam: 3.30 cm MITRAL VALVE MV Area (PHT): 3.06 cm     SHUNTS MV Area VTI:   1.38 cm     Systemic VTI:  0.18 m MV Peak grad:  9.4 mmHg     Systemic Diam: 2.10 cm MV Mean grad:  3.0 mmHg MV Vmax:       1.53 m/s MV Vmean:      80.7 cm/s MV Decel Time: 248 msec MV E velocity: 154.00 cm/s MV A velocity: 95.50 cm/s MV E/A ratio:  1.61 Kirk Ruths MD Electronically signed by Kirk Ruths MD Signature Date/Time: 02/21/2021/11:27:26 AM    Final     Microbiology: Recent Results (from the past  240 hour(s))  Resp Panel by RT-PCR (Flu A&B, Covid) Nasopharyngeal Swab     Status: None   Collection Time: 02/20/21  3:34 AM   Specimen: Nasopharyngeal Swab; Nasopharyngeal(NP) swabs in vial transport medium  Result Value Ref Range Status   SARS Coronavirus 2 by RT PCR NEGATIVE NEGATIVE Final    Comment: (NOTE) SARS-CoV-2 target nucleic acids are NOT DETECTED.  The SARS-CoV-2 RNA is generally detectable in upper respiratory specimens during the acute phase of infection. The lowest concentration of SARS-CoV-2 viral copies this assay can detect is 138 copies/mL. A negative result does not preclude SARS-Cov-2 infection and should not be used as the sole basis for treatment or other patient management decisions. A negative result may occur with  improper specimen collection/handling, submission of specimen other than nasopharyngeal swab, presence of viral mutation(s) within the areas targeted by this assay, and inadequate number of  viral copies(<138 copies/mL). A negative result must be combined with clinical observations, patient history, and epidemiological information. The expected result is Negative.  Fact Sheet for Patients:  EntrepreneurPulse.com.au  Fact Sheet for Healthcare Providers:  IncredibleEmployment.be  This test is no t yet approved or cleared by the [place] FDA and  has been authorized for detection and/or diagnosis of SARS-CoV-2 by FDA under an Emergency Use Authorization (EUA). This EUA will remain  in effect (meaning this test can be used) for the duration of the COVID-19 declaration under Section 564(b)(1) of the Act, 21 U.S.C.section 360bbb-3(b)(1), unless the authorization is terminated  or revoked sooner.       Influenza A by PCR NEGATIVE NEGATIVE Final   Influenza B by PCR NEGATIVE NEGATIVE Final    Comment: (NOTE) The Xpert Xpress SARS-CoV-2/FLU/RSV plus assay is intended as an aid in the diagnosis of influenza from Nasopharyngeal swab specimens and should not be used as a sole basis for treatment. Nasal washings and aspirates are unacceptable for Xpert Xpress SARS-CoV-2/FLU/RSV testing.  Fact Sheet for Patients: EntrepreneurPulse.com.au  Fact Sheet for Healthcare Providers: IncredibleEmployment.be  This test is not yet approved or cleared by the [place] FDA and has been authorized for detection and/or diagnosis of SARS-CoV-2 by FDA under an Emergency Use Authorization (EUA). This EUA will remain in effect (meaning this test can be used) for the duration of the COVID-19 declaration under Section 564(b)(1) of the Act, 21 U.S.C. section 360bbb-3(b)(1), unless the authorization is terminated or revoked.  Performed at [facility], [place] [address], [place], [place] [address]      Labs: Basic Metabolic Panel: Recent Labs  Lab 02/20/21 0313 02/21/21 0403 02/22/21 0335  02/23/21 0401 02/24/21 0342  NA 142 137  139 142 139 136  K 3.1* 3.9  3.9 3.9 3.8 3.6  CL 103 102  103 102 100 98  CO2 27 24  25 28 28 28   GLUCOSE 170* 216*  214* 114* 113* 107*  BUN 47* 61*  62* 64* 63* 60*  CREATININE 1.64* 1.83*  1.74* 1.81* 1.68* 1.54*  CALCIUM 9.2 8.6*  8.7* 8.6* 8.5* 8.4*  MG  --  2.7*  --   --   --    Liver Function Tests: Recent Labs  Lab 02/20/21 0313 02/21/21 0403  AST 37 29  ALT 44 33  ALKPHOS 88 65  BILITOT 1.3* 1.0  PROT 7.6 6.6  ALBUMIN 3.8 3.2*   No results for input(s): LIPASE, AMYLASE in the last 168 hours. No results for input(s): AMMONIA in the last 168 hours. CBC: Recent  Labs  Lab 02/20/21 0313 02/21/21 0403 02/22/21 0335 02/23/21 0401  WBC 15.3* 9.7 11.2* 8.9  NEUTROABS 11.0*  --   --   --   HGB 11.8* 9.9* 9.6* 9.8*  HCT 37.4* 30.5* 30.6* 30.6*  MCV 101.1* 99.7 101.3* 99.7  PLT 176 127* 140* 133*   Cardiac Enzymes: No results for input(s): CKTOTAL, CKMB, CKMBINDEX, TROPONINI in the last 168 hours. BNP: BNP (last 3 results) Recent Labs    06/03/20 0604 02/20/21 0313  BNP 1,981.3* 1,833.3*    ProBNP (last 3 results) No results for input(s): PROBNP in the last 8760 hours.  CBG: Recent Labs  Lab 02/23/21 1152 02/23/21 1635 02/23/21 2209 02/24/21 0756 02/24/21 1143  GLUCAP 150* 137* 126* 104* 168*       Signed:  Oswald Hillock MD.  [facility] 02/24/2021, 12:11 PM

## 2021-02-24 NOTE — TOC Transition Note (Signed)
Transition of Care [facility]) - CM/SW Discharge Note   Patient Details  Name: Andrew [name] MRN: [id number] Date of Birth: Feb 01, 1928  [facility] CM/SW Contact:  Ross Ludwig, LCSW Phone Number: 02/24/2021, 1:09 PM   Clinical Narrative:     Patient will be going home with home health through [facility].  CSW signing off please reconsult with any other social work needs, home health agency has been notified of planned discharge.  Final next level of care: [place] Barriers to Discharge: Barriers Resolved   Patient Goals and CMS Choice Patient states their goals for this hospitalization and ongoing recovery are:: To return back home. CMS Medicare.gov Compare Post Acute Care list provided to:: Patient Represenative (must comment) Choice offered to / list presented to : Spouse  Discharge Placement  Home with home health services.                     Discharge Plan and Services                          HH Arranged: PT [place] Agency: Other - See comment ([facility] home health) Date HH Agency Contacted: 02/24/21 Time [place]: 1257 Representative spoke with at [place]: [place] ([place]) Interventions     Readmission Risk Interventions Readmission Risk Prevention Plan 06/08/2020 07/04/2019  Transportation Screening Complete Complete  PCP or Specialist Appt within 3-5 Days Not Complete Complete  Not Complete comments disposition pending- likely SNF -  [place] or [place] Complete Complete  Social Work Consult for [place] Planning/Counseling Complete Complete  Palliative Care Screening Not Applicable Complete  Medication Review Press photographer) Referral to Pharmacy Complete  Some recent data might be hidden

## 2021-02-24 NOTE — Plan of Care (Signed)

## 2021-02-24 NOTE — Progress Notes (Signed)
Patient discharged home.  IV removed - WNL.  Reviewed AVS and medication changes with him and wife.  All questions answered.  Follow up in place with cardio.  Emphasized importance of daily weights and keeping fluid intake below 2L.  Patient assisted off unit via [place] in NAD.

## 2021-02-25 ENCOUNTER — Other Ambulatory Visit (HOSPITAL_COMMUNITY): Payer: Self-pay

## 2021-02-25 NOTE — Progress Notes (Signed)
Came out today for med rec post hosp d/c.  His carvedilol dose was decreased and his cilostazol was stopped.  Wife thinks he has been referred for home health?  His pill box is filled for 2 wks. Advised her to call me or zack should anything be needed before then.   Marylouise Stacks, EMT-Paramedic 02/25/2021

## 2021-02-26 ENCOUNTER — Telehealth: Payer: Self-pay

## 2021-02-26 ENCOUNTER — Encounter (HOSPITAL_BASED_OUTPATIENT_CLINIC_OR_DEPARTMENT_OTHER): Payer: Medicare PPO | Admitting: Physician Assistant

## 2021-02-26 NOTE — Telephone Encounter (Signed)
Transition Care Management Follow-up Telephone Call  Date of discharge and from where: [facility] 02/24/21  How have you been since you were released from the hospital? ok  Any questions or concerns? No  Items Reviewed:  Did the pt receive and understand the discharge instructions provided? Yes   Medications obtained and verified? Yes   Other? No   Any new allergies since your discharge? No   Dietary orders reviewed? Yes  Do you have support at home? Yes   Home Care and Equipment/Supplies: Were home health services ordered? not applicable If so, what is the name of the agency?   Has the agency set up a time to come to the patient's home? not applicable Were any new equipment or medical supplies ordered?  No What is the name of the medical supply agency?  Were you able to get the supplies/equipment? not applicable Do you have any questions related to the use of the equipment or supplies? No  Functional Questionnaire: (I = Independent and D = Dependent) ADLs:   Bathing/Dressing-   Meal Prep-   Eating-   Maintaining continence-   Transferring/Ambulation-   Managing Meds-   Follow up appointments reviewed:   PCP Hospital f/u appt confirmed? No    Specialist Hospital f/u appt confirmed? Yes  Scheduled to see Cardiology  on 03/09/21 @ 12:00 pm.  Are transportation arrangements needed? No   If their condition worsens, is the pt aware to call PCP or go to the Emergency Dept.? Yes  Was the patient provided with contact information for the PCP's office or ED? Yes  Was to pt encouraged to call back with questions or concerns? Yes

## 2021-03-01 ENCOUNTER — Other Ambulatory Visit: Payer: Self-pay

## 2021-03-01 ENCOUNTER — Encounter (HOSPITAL_BASED_OUTPATIENT_CLINIC_OR_DEPARTMENT_OTHER): Payer: Medicare PPO | Attending: Physician Assistant | Admitting: Physician Assistant

## 2021-03-01 DIAGNOSIS — Z86718 Personal history of other venous thrombosis and embolism: Secondary | ICD-10-CM | POA: Diagnosis not present

## 2021-03-01 DIAGNOSIS — I110 Hypertensive heart disease with heart failure: Secondary | ICD-10-CM | POA: Insufficient documentation

## 2021-03-01 DIAGNOSIS — L601 Onycholysis: Secondary | ICD-10-CM | POA: Insufficient documentation

## 2021-03-01 DIAGNOSIS — L97822 Non-pressure chronic ulcer of other part of left lower leg with fat layer exposed: Secondary | ICD-10-CM | POA: Insufficient documentation

## 2021-03-01 DIAGNOSIS — I480 Paroxysmal atrial fibrillation: Secondary | ICD-10-CM | POA: Insufficient documentation

## 2021-03-01 DIAGNOSIS — I509 Heart failure, unspecified: Secondary | ICD-10-CM | POA: Insufficient documentation

## 2021-03-01 DIAGNOSIS — J449 Chronic obstructive pulmonary disease, unspecified: Secondary | ICD-10-CM | POA: Diagnosis not present

## 2021-03-01 DIAGNOSIS — I87333 Chronic venous hypertension (idiopathic) with ulcer and inflammation of bilateral lower extremity: Secondary | ICD-10-CM | POA: Insufficient documentation

## 2021-03-01 DIAGNOSIS — Z85820 Personal history of malignant melanoma of skin: Secondary | ICD-10-CM | POA: Insufficient documentation

## 2021-03-01 DIAGNOSIS — L97811 Non-pressure chronic ulcer of other part of right lower leg limited to breakdown of skin: Secondary | ICD-10-CM | POA: Diagnosis not present

## 2021-03-01 DIAGNOSIS — L97812 Non-pressure chronic ulcer of other part of right lower leg with fat layer exposed: Secondary | ICD-10-CM | POA: Insufficient documentation

## 2021-03-01 DIAGNOSIS — Z7901 Long term (current) use of anticoagulants: Secondary | ICD-10-CM | POA: Diagnosis not present

## 2021-03-01 DIAGNOSIS — E11621 Type 2 diabetes mellitus with foot ulcer: Secondary | ICD-10-CM | POA: Insufficient documentation

## 2021-03-01 DIAGNOSIS — L97829 Non-pressure chronic ulcer of other part of left lower leg with unspecified severity: Secondary | ICD-10-CM | POA: Diagnosis not present

## 2021-03-01 DIAGNOSIS — E1151 Type 2 diabetes mellitus with diabetic peripheral angiopathy without gangrene: Secondary | ICD-10-CM | POA: Diagnosis not present

## 2021-03-01 NOTE — Progress Notes (Addendum)
DAWAN, FARNEY ([id number]) Visit Report for 03/01/2021 Chief Complaint Document Details Patient Name: Date of Service: Andrew [name], GEO [facility] 03/01/2021 12:45 PM Medical Record Number: [id number] Patient Account Number: [id number] Date of Birth/Sex: Treating RN: 02-24-1928 (87 y.o. Janyth Contes Primary Care Provider: Dimas Chyle Other Clinician: Referring Provider: Treating Provider/Extender: Lebron Quam in Treatment: 24 Information Obtained from: Patient Chief Complaint 09/11/2020; patient is here for review of wounds on his bilateral lower extremities and left heel Electronic Signature(s) Signed: 03/01/2021 1:08:25 PM By: Worthy Keeler PA-C Entered By: Worthy Keeler on 03/01/2021 13:08:25 -------------------------------------------------------------------------------- HPI Details Patient Name: Date of Service: Andrew [name], [name] 03/01/2021 12:45 PM Medical Record Number: [id number] Patient Account Number: [id number] Date of Birth/Sex: Treating RN: 05-27-1928 (87 y.o. Janyth Contes Primary Care Provider: Dimas Chyle Other Clinician: Referring Provider: Treating Provider/Extender: Lebron Quam in Treatment: 24 History of Present Illness HPI Description: 09/11/2020 ADMISSION This is a soon-to-be 87 year old man who was hospitalized in June and subsequently spent time at [place] home. At some point he developed a pressure ulcer on his left heel. On discharge he was seen by podiatry and referred to Dr. Donzetta Matters with regards to the possibility of PAD. The patient underwent angiography on 08/10/2020 this showed heavily calcified vessels. The common iliac artery on the left appeared to have calcific disease but there was no gradient. Left common external leg arteries were heavily tortuous left lower extremity limited angiography developed demonstrated a SFA to be helped heavily calcified throughout with multiple greater than  50% stenosis but never frankly occludes he was not felt to be a candidate for endovascular repair. I think they had a minimal look at the right side and it appeared that the SFA was occluded and reconstitutes above the knee. The overall feeling was that if he could not heal his left heel ulcer he will eventually best be served by a left above-knee amputation The last I can see is that he should have been using a Santyl wet-to-dry dressing to the heel. During his stay with podiatry was also noted that he had venous insufficiency and I think venous insufficiency wounds. His wife tells me that he has been recommended for compression stockings in the past but the patient refused, he has wounds on his bilateral anterior lower legs right greater than the left. Chronic venous inflammation and dry flaking skin. Past medical history; heart failure, right heel wound, paroxysmal atrial fibrillation on Eliquis, abdominal aortic aneurysm with previous repair, COPD, DVT, melanoma on his back, diabetes with a recent hemoglobin A1c of 7.3 however he is not on any treatment [type II], PAD, coronary artery disease, hypertension The patient's most recent arterial studies were on 05/28/2020. This showed an ABI on the right at 1.12 but monophasic waveforms and absent waveforms at the posterior tibial artery. On the left his ABI was 1.47 [noncompressible] he had triphasic waveforms at the ATA and monophasic at the PTA. His TBI on the right was 1.12 on the left and 0.97. It was felt his ABIs were falsely elevated 9/24. The patient followed up with Dr. Donzetta Matters and had noninvasive arterial studies. On the right his ABIs were noncompressible monophasic waveforms with a TBI of 1.45 and a right great toe pressure of 172. On the left he had monophasic waveforms with noncompressible ABIs TBI of 0.79 and a left great toe pressure of 94. He will be seen in follow-up by Dr. Donzetta Matters in  a month's time. The patient also has very significant  chronic venous insufficiency 10/8; patient with known PAD. He came in with bilateral lower extremity wounds likely secondary to venous stasis. Still has one on the upper right tibial area. The area on the left leg is healed however he still has an area on the left heel. This needed an extensive debridement today. His wife brings in notes apparently Dr. March Rummage ordered an MRI of the left heel. I was unaware of this. I was not aware that he did not been seen by Dr. March Rummage and I do not have any records of this. Apparently MRI called to set up the appointment. I will need to look through epic on this 10/15; this is a patient with known PAD who was revascularized. He also has venous stasis disease. The area that was on the right anterior tibia has actually close down although he has a still a small area on the upper anterior tibia. 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 We have been using Iodoflex on this area and he is required debridements. We tried to get her to agree to an MRI that have been ordered by Dr. March Rummage. However she did not get back to the MRI people or they have not called her back. We will reorder this. He is quite tender around this wound and I wonder about osteomyelitis. 10/29; MRI. Fortunately this did not show any evidence of abscess or osteomyelitis in the left heel. Vein and vascular on 10/21; this is a patient who is previously had an angiogram. He had a heavily calcified aorta and iliac vessels as well as the SFA. He had multiple areas of diffuse 50% stenosis. No interventions were apparently done by Dr. Donzetta Matters he does have an occluded right SFA but with above-knee reconstitution. He was not felt to be a candidate for revascularization for his diffuse areas of greater than 50% stenosis. It was not felt that  these were amenable to intervention Since he was last here he went to see dermatology. I believe he had skin cancer extractions from the right area anteriorly and 2 on the left medial. 11/12; left heel actually looks somewhat better but still requiring debridement. We've been using Iodoflex Right anterior mid tibia surgical extraction for a skin cancer. This also required debridement The area on the left lower medial leg looks a lot better and is just about fully epithelialized 12/3; pressure ulcer on the left heel in the setting of PAD. This looks better still requiring debridement we have been using Iodoflex Right anterior mid tibia surgical extraction for cancer this also continually requires debridement although the wound measures smaller The area on the left lower medial leg had much more eschar on this than last time this is a deterioration. Notable for the fact that home health only wrap the leg halfway and actually had one of the wraps at the level of the wound on the right anterior. 12/17; the area on the left heel is just about closed. The area on the right anterior tibia looks much healthier and is smaller. The area on the left medial ankle I think is also smaller. We have been using Iodoflex to all areas under 3 layer compression bilaterally. He has home health 1/7; left heel is healed. He has an area  on the right anterior tibia and the left medial lower leg. Significant chronic venous insufficiency he has home health 1/21; left heel remains healed which is his original wound. He is yet area on the right anterior tibia which is smaller and an area on the left medial lower leg just above the medial malleolus this also looks smaller. He has significant chronic venous insufficiency 2/4; left heel remains closed. He has an area on the right anterior and a left medial lower leg this is in the setting of chronic venous insufficiency. We have been using Hydrofera Blue and 3 layer compression  bilateral 2/18; patient with chronic venous insufficiency wounds on the right anterior and left medial lower leg the left medial lower leg is just about closed we have excellent edema control. Original pressure ulcer on his left heel remains closed. He arrives in clinic today with a thick mycotic nail on the left great toe split in the middle. There was apparently quite a bit of bleeding the other day on a "white carpet" 03/01/2021 upon evaluation today patient appears to be doing well with regard to his leg ulcer all things considered. It is very dry but I do not think he is healed at this point. We ordered Wallie Char wraps has not heard anything back from that but he has been in the hospital since 26 February due to congestive heart failure. This might be part of why he missed that call. Nonetheless he still has an open wound on the right leg which I think is continue to be managed its dry right now but is not completely healed. Fortunately there does not appear to be any signs of active infection at this time. No fevers, chills, nausea, vomiting, or diarrhea. Electronic Signature(s) Signed: 03/01/2021 1:29:21 PM By: Worthy Keeler PA-C Entered By: Worthy Keeler on 03/01/2021 13:29:20 -------------------------------------------------------------------------------- Physical Exam Details Patient Name: Date of Service: Andrew [name], [name] 03/01/2021 12:45 PM Medical Record Number: [id number] Patient Account Number: [id number] Date of Birth/Sex: Treating RN: [birth year] (87 y.o. Janyth Contes Primary Care Provider: Dimas Chyle Other Clinician: Referring Provider: Treating Provider/Extender: Welford Roche Weeks in Treatment: 79 Constitutional Well-nourished and well-hydrated in no acute distress. Respiratory normal breathing without difficulty. Psychiatric this patient is able to make decisions and demonstrates good insight into disease process. Alert and Oriented x 3.  pleasant and cooperative. Notes Upon inspection patient's wound bed showed signs of good granulation at this point. There does not appear to be any evidence of significant slough buildup there is a little bit of dry drainage on the surface of the wound but again this is not closed as of yet. I do think he still has a bit of healing to do. Also think he does need to have a compression wrap I think a Kerlix Coban wrap is appropriate for [place] to continue here also think getting him the juxta light wraps would also be of benefit. Patient does have at least 2+ to 3+ pitting edema noted especially in the foot. Electronic Signature(s) Signed: 03/01/2021 1:30:03 PM By: Worthy Keeler PA-C Signed: 03/01/2021 1:30:03 PM By: Worthy Keeler PA-C Entered By: Worthy Keeler on 03/01/2021 13:30:02 -------------------------------------------------------------------------------- Physician Orders Details Patient Name: Date of Service: Andrew [name], [name] 03/01/2021 12:45 PM Medical Record Number: [id number] Patient Account Number: [id number] Date of Birth/Sex: Treating RN: [birth year] (87 y.o. Janyth Contes Primary Care Provider: Dimas Chyle Other Clinician: Referring Provider: Treating Provider/Extender: Worthy Keeler  Dimas Chyle Weeks in Treatment: 24 Verbal / Phone Orders: No Diagnosis Coding ICD-10 Coding Code Description E11.621 Type 2 diabetes mellitus with foot ulcer I87.333 Chronic venous hypertension (idiopathic) with ulcer and inflammation of bilateral lower extremity L97.811 Non-pressure chronic ulcer of other part of right lower leg limited to breakdown of skin L97.829 Non-pressure chronic ulcer of other part of left lower leg with unspecified severity T81.31XA Disruption of external operation (surgical) wound, not elsewhere classified, initial encounter L60.1 Onycholysis Follow-up Appointments Return Appointment in 2 weeks. Bathing/ Shower/ Hygiene May shower with protection but  do not get wound dressing(s) wet. Edema Control - Lymphedema / SCD / Other Bilateral Lower Extremities Elevate legs to the level of the heart or above for 30 minutes daily and/or when sitting, a frequency of: - throughout the day Avoid standing for long periods of time. Exercise regularly Compression stocking or Garment 20-30 mm/Hg pressure to: - Farrow wrap to left leg daily when available, apply first thing in the morning, remove at night before bed Non Wound Condition pply the following to affected area as directed: - triple antibiotic ointment to left great toenail bed daily and cover with a bandaid A Home Health No change in wound care orders this week; continue [place] for wound care. May utilize formulary equivalent dressing for wound treatment orders unless otherwise specified. Other Home Health Orders/Instructions: - Medi Home Wound Treatment Wound #4 - Lower Leg Wound Laterality: Right, Anterior, Proximal Cleanser: Soap and Water (Home Health) 2 x Per Week/30 Days Discharge Instructions: May shower and wash wound with dial antibacterial soap and water prior to dressing change. Cleanser: Normal Saline (Home Health) 2 x Per Week/30 Days Discharge Instructions: Cleanse the wound with Normal Saline prior or wound cleanser to applying a clean dressing using gauze sponges, not tissue or cotton balls. Peri-Wound Care: Sween Lotion (Moisturizing lotion) (Home Health) 2 x Per Week/30 Days Discharge Instructions: Apply moisturizing lotion or equivalent moisterizing lotion to legs under wraps Prim Dressing: Hydrofera Blue Classic Foam, 2x2 in (Home Health) 2 x Per Week/30 Days ary Discharge Instructions: Moisten with saline prior to applying to wound bed Secondary Dressing: Woven Gauze Sponge, Non-Sterile 4x4 in (Home Health) 2 x Per Week/30 Days Discharge Instructions: Apply over primary dressing as directed. Compression Wrap: Kerlix Roll 4.5x3.1 (in/yd) (Home Health) 2 x Per Week/30  Days Discharge Instructions: Apply Kerlix and Coban compression , wrap from base of toes to knee Compression Wrap: Coban Self-Adherent Wrap 4x5 (in/yd) (Home Health) 2 x Per Week/30 Days Discharge Instructions: Apply over Kerlix as directed. Electronic Signature(s) Signed: 03/01/2021 5:52:07 PM By: Worthy Keeler PA-C Signed: 03/02/2021 6:01:52 PM By: Levan Hurst RN, BSN Entered By: Levan Hurst on 03/01/2021 13:29:31 -------------------------------------------------------------------------------- Problem List Details Patient Name: Date of Service: Andrew [name], [name] 03/01/2021 12:45 PM Medical Record Number: [id number] Patient Account Number: [id number] Date of Birth/Sex: Treating RN: June 21, 1928 (87 y.o. Janyth Contes Primary Care Provider: Dimas Chyle Other Clinician: Referring Provider: Treating Provider/Extender: Lebron Quam in Treatment: 24 Active Problems ICD-10 Encounter Code Description Active Date MDM Diagnosis E11.621 Type 2 diabetes mellitus with foot ulcer 09/11/2020 No Yes I87.333 Chronic venous hypertension (idiopathic) with ulcer and inflammation of 09/11/2020 No Yes bilateral lower extremity L97.811 Non-pressure chronic ulcer of other part of right lower leg limited to breakdown 09/11/2020 No Yes of skin L97.829 Non-pressure chronic ulcer of other part of left lower leg with unspecified 10/23/2020 No Yes severity T81.31XA Disruption of external operation (  surgical) wound, not elsewhere classified, 10/23/2020 No Yes initial encounter L60.1 Onycholysis 02/12/2021 No Yes Inactive Problems ICD-10 Code Description Active Date Inactive Date L97.422 Non-pressure chronic ulcer of left heel and midfoot with fat layer exposed 09/11/2020 09/11/2020 L97.821 Non-pressure chronic ulcer of other part of left lower leg limited to breakdown of skin 09/11/2020 09/11/2020 L97.818 Non-pressure chronic ulcer of other part of right lower leg with other  specified severity 10/23/2020 10/23/2020 Resolved Problems Electronic Signature(s) Signed: 03/01/2021 1:08:20 PM By: Worthy Keeler PA-C Entered By: Worthy Keeler on 03/01/2021 13:08:20 -------------------------------------------------------------------------------- Progress Note Details Patient Name: Date of Service: Andrew [name], [name] 03/01/2021 12:45 PM Medical Record Number: [id number] Patient Account Number: [id number] Date of Birth/Sex: Treating RN: January 14, 1928 (87 y.o. Janyth Contes Primary Care Provider: Dimas Chyle Other Clinician: Referring Provider: Treating Provider/Extender: Lebron Quam in Treatment: 24 Subjective Chief Complaint Information obtained from Patient 09/11/2020; patient is here for review of wounds on his bilateral lower extremities and left heel History of Present Illness (HPI) 09/11/2020 ADMISSION This is a soon-to-be 87 year old man who was hospitalized in June and subsequently spent time at [place] home. At some point he developed a pressure ulcer on his left heel. On discharge he was seen by podiatry and referred to Dr. Donzetta Matters with regards to the possibility of PAD. The patient underwent angiography on 08/10/2020 this showed heavily calcified vessels. The common iliac artery on the left appeared to have calcific disease but there was no gradient. Left common external leg arteries were heavily tortuous left lower extremity limited angiography developed demonstrated a SFA to be helped heavily calcified throughout with multiple greater than 50% stenosis but never frankly occludes he was not felt to be a candidate for endovascular repair. I think they had a minimal look at the right side and it appeared that the SFA was occluded and reconstitutes above the knee. The overall feeling was that if he could not heal his left heel ulcer he will eventually best be served by a left above-knee amputation The last I can see is that he  should have been using a Santyl wet-to-dry dressing to the heel. During his stay with podiatry was also noted that he had venous insufficiency and I think venous insufficiency wounds. His wife tells me that he has been recommended for compression stockings in the past but the patient refused, he has wounds on his bilateral anterior lower legs right greater than the left. Chronic venous inflammation and dry flaking skin. Past medical history; heart failure, right heel wound, paroxysmal atrial fibrillation on Eliquis, abdominal aortic aneurysm with previous repair, COPD, DVT, melanoma on his back, diabetes with a recent hemoglobin A1c of 7.3 however he is not on any treatment [type II], PAD, coronary artery disease, hypertension The patient's most recent arterial studies were on 05/28/2020. This showed an ABI on the right at 1.12 but monophasic waveforms and absent waveforms at the posterior tibial artery. On the left his ABI was 1.47 [noncompressible] he had triphasic waveforms at the ATA and monophasic at the PTA. His TBI on the right was 1.12 on the left and 0.97. It was felt his ABIs were falsely elevated 9/24. The patient followed up with Dr. Donzetta Matters and had noninvasive arterial studies. On the right his ABIs were noncompressible monophasic waveforms with a TBI of 1.45 and a right great toe pressure of 172. On the left he had monophasic waveforms with noncompressible ABIs TBI of 0.79 and a left great  toe pressure of 94. He will be seen in follow-up by Dr. Donzetta Matters in a month's time. The patient also has very significant chronic venous insufficiency 10/8; patient with known PAD. He came in with bilateral lower extremity wounds likely secondary to venous stasis. Still has one on the upper right tibial area. The area on the left leg is healed however he still has an area on the left heel. This needed an extensive debridement today. His wife brings in notes apparently Dr. March Rummage ordered an MRI of the left heel. I  was unaware of this. I was not aware that he did not been seen by Dr. March Rummage and I do not have any records of this. Apparently MRI called to set up the appointment. I will need to look through epic on this 10/15; this is a patient with known PAD who was revascularized. He also has venous stasis disease. The area that was on the right anterior tibia has actually close down although he has a still a small area on the upper anterior tibia. 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 We have been using Iodoflex on this area and he is required debridements. We tried to get her to agree to an MRI that have been ordered by Dr. March Rummage. However she did not get back to the MRI people or they have not called her back. We will reorder this. He is quite tender around this wound and I wonder about osteomyelitis. 10/29; MRI. Fortunately this did not show any evidence of abscess or osteomyelitis in the left heel. Vein and vascular on 10/21; this is a patient who is previously had an angiogram. He had a heavily calcified aorta and iliac vessels as well as the SFA. He had multiple areas of diffuse 50% stenosis. No interventions were apparently done by Dr. Donzetta Matters he does have an occluded right SFA but with above-knee reconstitution. He was not felt to be a candidate for revascularization for his diffuse areas of greater than 50% stenosis. It was not felt that these were amenable to intervention Since he was last here he went to see dermatology. I believe he had skin cancer extractions from the right area anteriorly and 2 on the left medial. 11/12; left heel actually looks somewhat better but still requiring debridement. We've been using Iodoflex ooRight anterior mid tibia surgical extraction for a skin cancer. This also required debridement ooThe  area on the left lower medial leg looks a lot better and is just about fully epithelialized 12/3; pressure ulcer on the left heel in the setting of PAD. This looks better still requiring debridement we have been using Iodoflex ooRight anterior mid tibia surgical extraction for cancer this also continually requires debridement although the wound measures smaller ooThe area on the left lower medial leg had much more eschar on this than last time this is a deterioration. ooNotable for the fact that home health only wrap the leg halfway and actually had one of the wraps at the level of the wound on the right anterior. 12/17; the area on the left heel is just about closed. The area on the right anterior tibia looks much healthier and is smaller. The area on the left medial ankle I think is also smaller. We have been using Iodoflex to all areas under 3 layer compression  bilaterally. He has home health 1/7; left heel is healed. He has an area on the right anterior tibia and the left medial lower leg. Significant chronic venous insufficiency he has home health 1/21; left heel remains healed which is his original wound. He is yet area on the right anterior tibia which is smaller and an area on the left medial lower leg just above the medial malleolus this also looks smaller. He has significant chronic venous insufficiency 2/4; left heel remains closed. He has an area on the right anterior and a left medial lower leg this is in the setting of chronic venous insufficiency. We have been using Hydrofera Blue and 3 layer compression bilateral 2/18; patient with chronic venous insufficiency wounds on the right anterior and left medial lower leg the left medial lower leg is just about closed we have excellent edema control. Original pressure ulcer on his left heel remains closed. He arrives in clinic today with a thick mycotic nail on the left great toe split in the middle. There was apparently quite a bit of  bleeding the other day on a "white carpet" 03/01/2021 upon evaluation today patient appears to be doing well with regard to his leg ulcer all things considered. It is very dry but I do not think he is healed at this point. We ordered Wallie Char wraps has not heard anything back from that but he has been in the hospital since 26 February due to congestive heart failure. This might be part of why he missed that call. Nonetheless he still has an open wound on the right leg which I think is continue to be managed its dry right now but is not completely healed. Fortunately there does not appear to be any signs of active infection at this time. No fevers, chills, nausea, vomiting, or diarrhea. Objective Constitutional Well-nourished and well-hydrated in no acute distress. Vitals Time Taken: 1:05 PM, Height: 67 in, Weight: 185 lbs, BMI: 29, Temperature: 98 F, Pulse: 53 bpm, Respiratory Rate: 17 breaths/min, Blood Pressure: 102/50 mmHg. Respiratory normal breathing without difficulty. Psychiatric this patient is able to make decisions and demonstrates good insight into disease process. Alert and Oriented x 3. pleasant and cooperative. General Notes: Upon inspection patient's wound bed showed signs of good granulation at this point. There does not appear to be any evidence of significant slough buildup there is a little bit of dry drainage on the surface of the wound but again this is not closed as of yet. I do think he still has a bit of healing to do. Also think he does need to have a compression wrap I think a Kerlix Coban wrap is appropriate for [place] to continue here also think getting him the juxta light wraps would also be of benefit. Patient does have at least 2+ to 3+ pitting edema noted especially in the foot. Integumentary (Hair, Skin) Wound #4 status is Open. Original cause of wound was Gradually Appeared. The date acquired was: 10/09/2020. The wound has been in treatment 20 weeks. The wound is  located on the Right,Proximal,Anterior Lower Leg. The wound measures 0.4cm length x 0.3cm width x 0.1cm depth; 0.094cm^2 area and 0.009cm^3 volume. There is Fat Layer (Subcutaneous Tissue) exposed. There is no tunneling or undermining noted. There is a small amount of serosanguineous drainage noted. The wound margin is flat and intact. There is large (67-100%) red granulation within the wound bed. There is no necrotic tissue within the wound bed. Wound #5 status is Healed - Epithelialized. Original  cause of wound was Gradually Appeared. The date acquired was: 10/23/2020. The wound has been in treatment 18 weeks. The wound is located on the Left,Medial Lower Leg. The wound measures 0cm length x 0cm width x 0cm depth; 0cm^2 area and 0cm^3 volume. There is a none present amount of drainage noted. The wound margin is distinct with the outline attached to the wound base. There is no granulation within the wound bed. There is no necrotic tissue within the wound bed. 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 Bathing/ Shower/ Hygiene: May shower with protection but do not get wound dressing(s) wet. Edema Control - Lymphedema / SCD / Other: Elevate legs to the level of the heart or above for 30 minutes daily and/or when sitting, a frequency of: - throughout the day Avoid standing for long periods of time. Exercise regularly Compression stocking or Garment 20-30 mm/Hg pressure to: - Farrow wrap to left leg daily when available, apply  first thing in the morning, remove at night before bed Non Wound Condition: Apply the following to affected area as directed: - triple antibiotic ointment to left great toenail bed daily and cover with a bandaid Home Health: No change in wound care orders this week; continue [facility] for wound care. May utilize formulary equivalent dressing for wound treatment orders unless otherwise specified. Other Home Health Orders/Instructions: - Medi Home WOUND #4: - Lower Leg Wound Laterality: Right, Anterior, Proximal Cleanser: Soap and Water (Home Health) 2 x Per Week/30 Days Discharge Instructions: May shower and wash wound with dial antibacterial soap and water prior to dressing change. Cleanser: Normal Saline (Home Health) 2 x Per Week/30 Days Discharge Instructions: Cleanse the wound with Normal Saline prior or wound cleanser to applying a clean dressing using gauze sponges, not tissue or cotton balls. Peri-Wound Care: Sween Lotion (Moisturizing lotion) (Home Health) 2 x Per Week/30 Days Discharge Instructions: Apply moisturizing lotion or equivalent moisterizing lotion to legs under wraps Prim Dressing: Hydrofera Blue Classic Foam, 2x2 in (Home Health) 2 x Per Week/30 Days ary Discharge Instructions: Moisten with saline prior to applying to wound bed Secondary Dressing: Woven Gauze Sponge, Non-Sterile 4x4 in (Home Health) 2 x Per Week/30 Days Discharge Instructions: Apply over primary dressing as directed. Com pression Wrap: Kerlix Roll 4.5x3.1 (in/yd) (Home Health) 2 x Per Week/30 Days Discharge Instructions: Apply Kerlix and Coban compression , wrap from base of toes to knee Com pression Wrap: Coban Self-Adherent Wrap 4x5 (in/yd) (Home Health) 2 x Per Week/30 Days Discharge Instructions: Apply over Kerlix as directed. 1. Would recommend currently that we going continue with the wound care measures as before using the [facility] I think this is a good option. 2. I also recommend that  we going continue with the Curlex and Coban wrap I think that is helping as well at this point. 3. I am also going to suggest that the patient continue to monitor for any signs of infection. If anything changes or worsens he should let me know as soon as possible but again the home health nurse mainly is changing this so that is when they will see things  more than any other time she should keep an eye on this as well. We will see patient back for reevaluation in 1 week here in the clinic. If anything worsens or changes patient will contact our office for additional recommendations. Electronic Signature(s) Signed: 03/01/2021 1:30:30 PM By: Worthy Keeler PA-C Entered By: Worthy Keeler on 03/01/2021 13:30:29 -------------------------------------------------------------------------------- SuperBill Details Patient Name: Date of Service: Andrew [name], [name] 03/01/2021 Medical Record Number: [id number] Patient Account Number: [id number] Date of Birth/Sex: Treating RN: [birth year] (87 y.o. Janyth Contes Primary Care Provider: Dimas Chyle Other Clinician: Referring Provider: Treating Provider/Extender: Welford Roche Weeks in Treatment: 24 Diagnosis Coding ICD-10 Codes Code Description E11.621 Type 2 diabetes mellitus with foot ulcer I87.333 Chronic venous hypertension (idiopathic) with ulcer and inflammation of bilateral lower extremity L97.811 Non-pressure chronic ulcer of other part of right lower leg limited to breakdown of skin L97.829 Non-pressure chronic ulcer of other part of left lower leg with unspecified severity T81.31XA Disruption of external operation (surgical) wound, not elsewhere classified, initial encounter L60.1 Onycholysis Facility Procedures CPT4 Code: [id number] Description: (Facility Use Only) [id number] - APPLY [place] RT LEG Modifier: Quantity: 1 Physician Procedures : CPT4 Code Description Modifier [id number] - WC PHYS LEVEL 3 - EST  PT ICD-10 Diagnosis Description E11.621 Type 2 diabetes mellitus with foot ulcer I87.333 Chronic venous hypertension (idiopathic) with ulcer and inflammation of bilateral lower  extremity L97.811 Non-pressure chronic ulcer of other part of right lower leg limited to breakdown of skin L97.829 Non-pressure chronic ulcer of other part of left lower leg with unspecified severity Quantity: 1 Electronic Signature(s) Signed: 03/01/2021 5:52:07 PM By: Worthy Keeler PA-C Signed: 03/02/2021 6:01:52 PM By: Levan Hurst RN, BSN Previous Signature: 03/01/2021 1:30:42 PM Version By: Worthy Keeler PA-C Entered By: Levan Hurst on 03/01/2021 13:31:59

## 2021-03-02 ENCOUNTER — Telehealth: Payer: Self-pay

## 2021-03-02 NOTE — Telephone Encounter (Signed)
VO given to [employer]

## 2021-03-02 NOTE — Telephone Encounter (Signed)
Andrew [name] is calling in from [facility] patient is being seen with them for nursing but was recently in the hospital and discharged they are wanting to add on PT for the patient, wondering if that is okay with Dr.Parker?

## 2021-03-02 NOTE — Telephone Encounter (Signed)
Ok with me. Please place any necessary orders. 

## 2021-03-02 NOTE — Telephone Encounter (Signed)
See note

## 2021-03-03 NOTE — Progress Notes (Signed)
LAEL, PILCH ([id number]) Visit Report for 03/01/2021 Arrival Information Details Patient Name: Date of Service: Andrew [name], GEO [facility] A. 03/01/2021 12:45 PM Medical Record Number: [id number] Patient Account Number: [id number] Date of Birth/Sex: Treating RN: 05-Jan-1928 (87 y.o. Burnadette Pop, [place] Primary Care Delmy Holdren: Dimas Chyle Other Clinician: Referring Efren Kross: Treating Terron Merfeld/Extender: Lebron Quam in Treatment: 24 Visit Information History Since Last Visit Added or deleted any medications: No Patient Arrived: Wheel Chair Any new allergies or adverse reactions: No Arrival Time: 13:05 Had a fall or experienced change in No Accompanied By: wife activities of daily living that may affect Transfer Assistance: None risk of falls: Patient Identification Verified: Yes Signs or symptoms of abuse/neglect since last visito No Secondary Verification Process Completed: Yes Hospitalized since last visit: No Patient Requires Transmission-Based Precautions: No Implantable device outside of the clinic excluding No Patient Has Alerts: No cellular tissue based products placed in the center since last visit: Has Dressing in Place as Prescribed: No Pain Present Now: No Electronic Signature(s) Signed: 03/01/2021 5:34:21 PM By: Rhae Hammock RN Entered By: Rhae Hammock on 03/01/2021 13:05:36 -------------------------------------------------------------------------------- Compression Therapy Details Patient Name: Date of Service: Andrew [name], [name] 03/01/2021 12:45 PM Medical Record Number: [id number] Patient Account Number: [id number] Date of Birth/Sex: Treating RN: 1928-12-19 (87 y.o. Janyth Contes Primary Care Kandance Yano: Dimas Chyle Other Clinician: Referring Inita Uram: Treating Xoe Hoe/Extender: Welford Roche Weeks in Treatment: 24 Compression Therapy Performed for Wound Assessment: Wound #4 Right,Proximal,Anterior Lower  Leg Performed By: Clinician Levan Hurst, RN Compression Type: Double Layer Post Procedure Diagnosis Same as Pre-procedure Electronic Signature(s) Signed: 03/02/2021 6:01:52 PM By: Levan Hurst RN, BSN Entered By: Levan Hurst on 03/01/2021 13:31:42 -------------------------------------------------------------------------------- Encounter Discharge Information Details Patient Name: Date of Service: Andrew [name], [name] 03/01/2021 12:45 PM Medical Record Number: [id number] Patient Account Number: [id number] Date of Birth/Sex: Treating RN: Apr 11, 1928 (87 y.o. [name] Primary Care Larico Dimock: Dimas Chyle Other Clinician: Referring Zale Marcotte: Treating Luciano Cinquemani/Extender: Lebron Quam in Treatment: 24 Encounter Discharge Information Items Discharge Condition: Stable Ambulatory Status: Wheelchair Discharge Destination: Home Transportation: Private Auto Accompanied By: spouse Schedule Follow-up Appointment: Yes Clinical Summary of Care: Patient Declined Electronic Signature(s) Signed: 03/01/2021 5:36:28 PM By: Baruch Gouty RN, BSN Entered By: Baruch Gouty on 03/01/2021 13:49:12 -------------------------------------------------------------------------------- Lower Extremity Assessment Details Patient Name: Date of Service: Andrew [name], [name] 03/01/2021 12:45 PM Medical Record Number: [id number] Patient Account Number: [id number] Date of Birth/Sex: Treating RN: 09-04-1928 (87 y.o. Burnadette Pop, Lauren Primary Care Brynne Doane: Dimas Chyle Other Clinician: Referring Joseh Sjogren: Treating Loretta Kluender/Extender: Welford Roche Weeks in Treatment: 24 Edema Assessment Assessed: Shirlyn Goltz: Yes] Patrice Paradise: Yes] Edema: [Left: No] [Right: No] Calf Left: Right: Point of Measurement: 44 cm From Medial Instep 31 cm 32 cm Ankle Left: Right: Point of Measurement: 10 cm From Medial Instep 23 cm 20.5 cm Vascular Assessment Pulses: Dorsalis  Pedis Palpable: [Left:Yes] [Right:Yes] Posterior Tibial Palpable: [Left:Yes] [Right:Yes] Electronic Signature(s) Signed: 03/01/2021 5:34:21 PM By: Rhae Hammock RN Entered By: Rhae Hammock on 03/01/2021 13:09:02 -------------------------------------------------------------------------------- Multi-Disciplinary Care Plan Details Patient Name: Date of Service: Andrew [name], [name] 03/01/2021 12:45 PM Medical Record Number: [id number] Patient Account Number: [id number] Date of Birth/Sex: Treating RN: [birth year] (87 y.o. Janyth Contes Primary Care Clifford Coudriet: Dimas Chyle Other Clinician: Referring Kathie Posa: Treating Yarelly Kuba/Extender: Lebron Quam in Treatment: [place] reviewed with physician Active Inactive Wound/Skin Impairment Nursing Diagnoses: Impaired tissue integrity  Goals: Patient/caregiver will verbalize understanding of skin care regimen Date Initiated: 10/23/2020 Target Resolution Date: 03/26/2021 Goal Status: Active Ulcer/skin breakdown will have a volume reduction of 50% by week 8 Date Initiated: 09/11/2020 Date Inactivated: 10/23/2020 Target Resolution Date: 10/16/2020 Goal Status: Unmet Unmet Reason: PAD Interventions: Provide education on ulcer and skin care Notes: Electronic Signature(s) Signed: 03/02/2021 6:01:52 PM By: Levan Hurst RN, BSN Entered By: Levan Hurst on 03/01/2021 13:31:08 -------------------------------------------------------------------------------- Pain Assessment Details Patient Name: Date of Service: Andrew [name], [name] 03/01/2021 12:45 PM Medical Record Number: [id number] Patient Account Number: [id number] Date of Birth/Sex: Treating RN: 30-Aug-1928 (87 y.o. Burnadette Pop, Lauren Primary Care Konstantina Nachreiner: Dimas Chyle Other Clinician: Referring Gerald Kuehl: Treating Mycheal Veldhu[place]/Extender: Welford Roche Weeks in Treatment: 24 Active Problems Location of Pain Severity  and Description of Pain Patient Has Paino No Site Locations Pain Management and Medication Current Pain Management: Electronic Signature(s) Signed: 03/01/2021 5:34:21 PM By: Rhae Hammock RN Entered By: Rhae Hammock on 03/01/2021 13:07:03 -------------------------------------------------------------------------------- Patient/Caregiver Education Details Patient Name: Date of Service: Andrew [name], [name] 3/7/2022andnbsp12:45 PM Medical Record Number: [id number] Patient Account Number: [id number] Date of Birth/Gender: Treating RN: 09/27/1928 (87 y.o. Janyth Contes Primary Care Physician: Dimas Chyle Other Clinician: Referring Physician: Treating Physician/Extender: Lebron Quam in Treatment: 24 Education Assessment Education Provided To: Patient Education Topics Provided Venous: Methods: Explain/Verbal Responses: State content correctly Wound/Skin Impairment: Methods: Explain/Verbal Responses: State content correctly Electronic Signature(s) Signed: 03/02/2021 6:01:52 PM By: Levan Hurst RN, BSN Entered By: Levan Hurst on 03/01/2021 13:31:26 -------------------------------------------------------------------------------- Wound Assessment Details Patient Name: Date of Service: Andrew [name], [name] 03/01/2021 12:45 PM Medical Record Number: [id number] Patient Account Number: [id number] Date of Birth/Sex: Treating RN: 12/29/1927 (87 y.o. Burnadette Pop, [place] Primary Care Laurelai Lepp: Dimas Chyle Other Clinician: Referring Julie Paolini: Treating Kassondra Geil/Extender: Welford Roche Weeks in Treatment: 24 Wound Status Wound Number: 4 Primary Venous Leg Ulcer Etiology: Wound Location: Right, Proximal, Anterior Lower Leg Wound Open Wounding Event: Gradually Appeared Status: Date Acquired: 10/09/2020 Comorbid Congestive Heart Failure, Coronary Artery Disease, Hypertension, Weeks Of Treatment: 20 History: Peripheral Arterial  Disease, Peripheral Venous Disease, Type II Clustered Wound: No Diabetes Photos Wound Measurements Length: (cm) 0.4 Width: (cm) 0.3 Depth: (cm) 0.1 Area: (cm) 0.094 Volume: (cm) 0.009 % Reduction in Area: 0% % Reduction in Volume: 0% Epithelialization: Medium (34-66%) Tunneling: No Undermining: No Wound Description Classification: Full Thickness Without Exposed Support Structures Wound Margin: Flat and Intact Exudate Amount: Small Exudate Type: Serosanguineous Exudate Color: red, brown Foul Odor After Cleansing: No Slough/Fibrino No Wound Bed Granulation Amount: Large (67-100%) Exposed Structure Granulation Quality: Red Fascia Exposed: No Necrotic Amount: None Present (0%) Fat Layer (Subcutaneous Tissue) Exposed: Yes Tendon Exposed: No Muscle Exposed: No Joint Exposed: No Bone Exposed: No Treatment Notes Wound #4 (Lower Leg) Wound Laterality: Right, Anterior, Proximal Cleanser Soap and Water Discharge Instruction: May shower and wash wound with dial antibacterial soap and water prior to dressing change. Normal Saline Discharge Instruction: Cleanse the wound with Normal Saline prior or wound cleanser to applying a clean dressing using gauze sponges, not tissue or cotton balls. Peri-Wound Care Sween Lotion (Moisturizing lotion) Discharge Instruction: Apply moisturizing lotion or equivalent moisterizing lotion to legs under wraps Topical Primary Dressing Hydrofera Blue Classic Foam, 2x2 in Discharge Instruction: Moisten with saline prior to applying to wound bed Secondary Dressing Woven Gauze Sponge, Non-Sterile 4x4 in Discharge Instruction: Apply over primary dressing as directed. Secured With Compression Wrap Kerlix Roll  4.5x3.1 (in/yd) Discharge Instruction: Apply Kerlix and Coban compression , wrap from base of toes to knee Coban Self-Adherent Wrap 4x5 (in/yd) Discharge Instruction: Apply over Kerlix as directed. Compression Stockings Add-Ons Electronic  Signature(s) Signed: 03/02/2021 11:11:03 AM By: Sandre Kitty Signed: 03/03/2021 5:36:09 PM By: Rhae Hammock RN Previous Signature: 03/01/2021 5:34:21 PM Version By: Rhae Hammock RN Entered By: Sandre Kitty on 03/02/2021 10:36:50 -------------------------------------------------------------------------------- Wound Assessment Details Patient Name: Date of Service: Andrew [name], [name] 03/01/2021 12:45 PM Medical Record Number: [id number] Patient Account Number: [id number] Date of Birth/Sex: Treating RN: 1928/03/20 (87 y.o. Burnadette Pop, [place] Primary Care Tayanna Talford: Dimas Chyle Other Clinician: Referring Javon Snee: Treating Jadriel Saxer/Extender: Welford Roche Weeks in Treatment: 24 Wound Status Wound Number: 5 Primary Diabetic Wound/Ulcer of the Lower Extremity Etiology: Wound Location: Left, Medial Lower Leg Wound Healed - Epithelialized Wounding Event: Gradually Appeared Status: Date Acquired: 10/23/2020 Comorbid Congestive Heart Failure, Coronary Artery Disease, Hypertension, Weeks Of Treatment: 18 History: Peripheral Arterial Disease, Peripheral Venous Disease, Type II Clustered Wound: Yes Diabetes Wound Measurements Length: (cm) Width: (cm) Depth: (cm) Area: (cm) Volume: (cm) 0 % Reduction in Area: 100% 0 % Reduction in Volume: 100% 0 Epithelialization: Large (67-100%) 0 0 Wound Description Classification: Grade 2 Wound Margin: Distinct, outline attached Exudate Amount: None Present Foul Odor After Cleansing: No Slough/Fibrino No Wound Bed Granulation Amount: None Present (0%) Exposed Structure Necrotic Amount: None Present (0%) Fascia Exposed: No Fat Layer (Subcutaneous Tissue) Exposed: No Tendon Exposed: No Muscle Exposed: No Joint Exposed: No Bone Exposed: No Electronic Signature(s) Signed: 03/01/2021 5:34:21 PM By: Rhae Hammock RN Signed: 03/02/2021 6:01:52 PM By: Levan Hurst RN, BSN Entered By: Levan Hurst on  03/01/2021 13:27:20 -------------------------------------------------------------------------------- Vitals Details Patient Name: Date of Service: Andrew [name], [name] 03/01/2021 12:45 PM Medical Record Number: [id number] Patient Account Number: [id number] Date of Birth/Sex: Treating RN: 05-10-1928 (87 y.o. Burnadette Pop, Lauren Primary Care Dmani Mizer: Dimas Chyle Other Clinician: Referring Jerrilynn Mikowski: Treating Marquite Attwood/Extender: Lebron Quam in Treatment: 24 Vital Signs Time Taken: 13:05 Temperature (F): 98 Height (in): 67 Pulse (bpm): 53 Weight (lbs): 185 Respiratory Rate (breaths/min): 17 Body Mass Index (BMI): 29 Blood Pressure (mmHg): 102/50 Reference Range: 80 - 120 mg / dl Electronic Signature(s) Signed: 03/01/2021 5:34:21 PM By: Rhae Hammock RN Entered By: Rhae Hammock on 03/01/2021 13:06:57

## 2021-03-04 DIAGNOSIS — S81801D Unspecified open wound, right lower leg, subsequent encounter: Secondary | ICD-10-CM | POA: Diagnosis not present

## 2021-03-04 DIAGNOSIS — E1122 Type 2 diabetes mellitus with diabetic chronic kidney disease: Secondary | ICD-10-CM | POA: Diagnosis not present

## 2021-03-04 DIAGNOSIS — I255 Ischemic cardiomyopathy: Secondary | ICD-10-CM | POA: Diagnosis not present

## 2021-03-04 DIAGNOSIS — J449 Chronic obstructive pulmonary disease, unspecified: Secondary | ICD-10-CM | POA: Diagnosis not present

## 2021-03-04 DIAGNOSIS — I130 Hypertensive heart and chronic kidney disease with heart failure and stage 1 through stage 4 chronic kidney disease, or unspecified chronic kidney disease: Secondary | ICD-10-CM | POA: Diagnosis not present

## 2021-03-04 DIAGNOSIS — N184 Chronic kidney disease, stage 4 (severe): Secondary | ICD-10-CM | POA: Diagnosis not present

## 2021-03-04 DIAGNOSIS — I872 Venous insufficiency (chronic) (peripheral): Secondary | ICD-10-CM | POA: Diagnosis not present

## 2021-03-04 DIAGNOSIS — E1151 Type 2 diabetes mellitus with diabetic peripheral angiopathy without gangrene: Secondary | ICD-10-CM | POA: Diagnosis not present

## 2021-03-04 DIAGNOSIS — I480 Paroxysmal atrial fibrillation: Secondary | ICD-10-CM | POA: Diagnosis not present

## 2021-03-09 ENCOUNTER — Encounter (HOSPITAL_COMMUNITY): Payer: Self-pay

## 2021-03-09 ENCOUNTER — Other Ambulatory Visit: Payer: Self-pay

## 2021-03-09 ENCOUNTER — Ambulatory Visit (HOSPITAL_COMMUNITY)
Admit: 2021-03-09 | Discharge: 2021-03-09 | Disposition: A | Discharge status: Home / Self Care | Payer: Medicare PPO | Attending: Cardiology | Admitting: Cardiology

## 2021-03-09 VITALS — BP 104/50 | HR 45 | Wt 171.0 lb

## 2021-03-09 DIAGNOSIS — R2689 Other abnormalities of gait and mobility: Secondary | ICD-10-CM | POA: Diagnosis not present

## 2021-03-09 DIAGNOSIS — I130 Hypertensive heart and chronic kidney disease with heart failure and stage 1 through stage 4 chronic kidney disease, or unspecified chronic kidney disease: Secondary | ICD-10-CM | POA: Diagnosis not present

## 2021-03-09 DIAGNOSIS — E1122 Type 2 diabetes mellitus with diabetic chronic kidney disease: Secondary | ICD-10-CM | POA: Diagnosis not present

## 2021-03-09 DIAGNOSIS — I2510 Atherosclerotic heart disease of native coronary artery without angina pectoris: Secondary | ICD-10-CM | POA: Diagnosis not present

## 2021-03-09 DIAGNOSIS — R001 Bradycardia, unspecified: Secondary | ICD-10-CM | POA: Insufficient documentation

## 2021-03-09 DIAGNOSIS — I6523 Occlusion and stenosis of bilateral carotid arteries: Secondary | ICD-10-CM | POA: Insufficient documentation

## 2021-03-09 DIAGNOSIS — Z79899 Other long term (current) drug therapy: Secondary | ICD-10-CM | POA: Insufficient documentation

## 2021-03-09 DIAGNOSIS — E1151 Type 2 diabetes mellitus with diabetic peripheral angiopathy without gangrene: Secondary | ICD-10-CM | POA: Diagnosis not present

## 2021-03-09 DIAGNOSIS — I69998 Other sequelae following unspecified cerebrovascular disease: Secondary | ICD-10-CM | POA: Diagnosis not present

## 2021-03-09 DIAGNOSIS — J479 Bronchiectasis, uncomplicated: Secondary | ICD-10-CM | POA: Diagnosis not present

## 2021-03-09 DIAGNOSIS — E785 Hyperlipidemia, unspecified: Secondary | ICD-10-CM | POA: Diagnosis not present

## 2021-03-09 DIAGNOSIS — Z8249 Family history of ischemic heart disease and other diseases of the circulatory system: Secondary | ICD-10-CM | POA: Insufficient documentation

## 2021-03-09 DIAGNOSIS — Z951 Presence of aortocoronary bypass graft: Secondary | ICD-10-CM | POA: Diagnosis not present

## 2021-03-09 DIAGNOSIS — R911 Solitary pulmonary nodule: Secondary | ICD-10-CM | POA: Insufficient documentation

## 2021-03-09 DIAGNOSIS — N183 Chronic kidney disease, stage 3 unspecified: Secondary | ICD-10-CM | POA: Diagnosis not present

## 2021-03-09 DIAGNOSIS — I5022 Chronic systolic (congestive) heart failure: Secondary | ICD-10-CM

## 2021-03-09 DIAGNOSIS — I480 Paroxysmal atrial fibrillation: Secondary | ICD-10-CM | POA: Diagnosis not present

## 2021-03-09 DIAGNOSIS — I252 Old myocardial infarction: Secondary | ICD-10-CM | POA: Insufficient documentation

## 2021-03-09 DIAGNOSIS — Z7901 Long term (current) use of anticoagulants: Secondary | ICD-10-CM | POA: Diagnosis not present

## 2021-03-09 DIAGNOSIS — E039 Hypothyroidism, unspecified: Secondary | ICD-10-CM | POA: Insufficient documentation

## 2021-03-09 DIAGNOSIS — I5042 Chronic combined systolic (congestive) and diastolic (congestive) heart failure: Secondary | ICD-10-CM | POA: Insufficient documentation

## 2021-03-09 DIAGNOSIS — Z7984 Long term (current) use of oral hypoglycemic drugs: Secondary | ICD-10-CM | POA: Insufficient documentation

## 2021-03-09 LAB — BASIC METABOLIC PANEL
Anion gap: 9 (ref 5–15)
BUN: 64 mg/dL — ABNORMAL HIGH (ref 8–23)
CO2: 29 mmol/L (ref 22–32)
Calcium: 8.8 mg/dL — ABNORMAL LOW (ref 8.9–10.3)
Chloride: 101 mmol/L (ref 98–111)
Creatinine, Ser: 2.17 mg/dL — ABNORMAL HIGH (ref 0.61–1.24)
GFR, Estimated: 28 mL/min — ABNORMAL LOW (ref >60)
Glucose, Bld: 171 mg/dL — ABNORMAL HIGH (ref 70–99)
Potassium: 3.8 mmol/L (ref 3.5–5.1)
Sodium: 139 mmol/L (ref 135–145)

## 2021-03-09 MED ORDER — CARVEDILOL 3.125 MG PO TABS: 3.1250 mg | ORAL_TABLET | Freq: Two times a day (BID) | ORAL | 3 refills | Status: DC

## 2021-03-09 NOTE — Progress Notes (Signed)
Date:  03/09/2021   ID:  [name], DOB [birth year], MRN [id number]  Provider location: [facility] Type of Visit: Established patient   PCP:  [name], MD  Cardiologist:  Dr. [name]   History of Present Illness: Andrew [name] is a 87 y.o. male who has a history of CKD, CAD s/p CABG, and ischemic cardiomyopathy with primarily diastolic CHF.  He has PAD followed at [facility].  Underwent LHC in 11/15. 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  He had had occlusion of a relatively small LAD, there were right to left collaterals and no intervention was done (medical management).    He and his wife went on a cruise along the [employer] in 5/16.  He admits to considerable dietary indiscretion (high sodium diet). It appears that he developed a hypertensive crisis along with chest pain while on the ship. He went into atrial fibrillation and developed CHF.  He was taken to the hospital in [place], [place].  He was in the ICU for about a week on Bipap intermittently.  Troponin peaked at 0.09 during this admission.  He was diuresed and after about 2 wks left the hospital and was able to fly home.    Cardiolite (6/16) showed EF 42%, prior anterolateral MI, no ischemia. Echo in 10/16 with EF 40-45%.    He was admitted on 07/06/15 for RLQ pain and fever.  He was treated for UTI and had a kidney stone as well. He developed SOB after IVF were given for AKI.  He was treated for acute COPD exacerbation as well as CHF decompensation.  He was diuresed with IV lasix.  His weight on admission was 179 and got as high as 188 after IVF. Discharge weight was 181.  He was admitted in 10/16 with a cerebellar CVA.  He has some resultant imbalance.   He was again admitted in 11/16 with acute on chronic systolic CHF.  This was a short admission that appeared to be precipitated by a sodium load from eating at a [employer] .   3/18 Echo showed stable EF 40-45%.  He was  also having chest pain and had Cardiolite in 3/18 that showe no ischemia.   He was admitted in 11/18 for elective hernia repair. Post-op, he developed hypoxemia and had a rehab stay.  He is now off oxygen.   Creatinine increased to 2.97 in 1/19.  I cut back on losartan and Lasix.  He developed increased lower extremity edema and we increased Lasix back to 120 qam/80 qpm.  He had a mechanical fall in 7/20 (tripped). He hit his head but refused to go to the ER.  Later in 7/20, he was admitted with fever and started on antibiotics with concern for PNA, but CT chest did not show definite PNA.  ?Viral syndrome.  COVID-19 was negative.   Echo in 8/20 showed  EF 45-50% with anterolateral hypokinesis, normal RV.   He was admitted in 6/21 with aspiration PNA and COPD exacerbation, sent to [facility] for rehab afterwards.   Patient developed a left heel ulcer. In 8/21, he had peripheral angiogram by Dr. Donzetta Matters showing multiple left SFA stenoses > 50% but no occlusion. The right SFA was occluded with recanalization.  No interventional option.   Echo in 9/21 showed EF 40-45%, anterolateral hypokinesis, normal RV, mild mitral stenosis with mean gradient 7 mmHg, IVC dilated.   Last seen by Dr. [name] 1/22. Leg  ulcer had healed and he denied claudication. Wt was down 2 lb and denied dyspnea w/ ADLs. Overall was feeling pretty good.  Torsemide was reduced to 80 qam/40 qpm and Farxiga 10 mg was added.   Unfortunately, he was admitted 2/22 for a/c CHF. Admitted by [facility] and general cardiology consulted. Diuresed w/ IV Lasix and transitioned back to torsemide 80 qm/ 40 qpm w/ recs to take metolazone PRN as directed by the [facility]. Also, Coreg was decreased due to bradycardia. Echocardiogram showed global hypokinesis and akinesis of the inferolateral walls.  EF was 25 to 30% (lower from prior). Hs trop was 36>>72. However decision was made not to peruse cath given lack of chest pain, age and fact that would not want  invasive procedures if he can be managed medically. Cilostazol for PAD was discontinued due to CHF. D/c wt was 170 lb.    He presents to clinic today for post hospital f/u. Here alone but followed in the community by paramedicine. Wt stable at 171 lb. Has remained stable at home. Denies any significant dyspnea. Able to get around and do basic ADLs w/o much limitations. Denies CP. No claudication off of cilostazol. BP soft but stable. Denies orthostatic symptoms. Pulse rate low 45 bpm. EKG done, showing marked sinus bradycardia 44 bpm. Denies fatigue.    ECG (personally reviewed): marked sinus brady, 44 bpm   Labs (12/14): K 4.8, creatinine 1.5 Labs (5/15): LDL 92 Labs (11/15): K 4.1, creatinine 1.5 Labs (12/15): K 4, creatinine 1.5 Labs (1/16): LFTs normal, LDL 51, HDL 47 Labs (5/16): TnI 0.09 Labs (6/16): K 4.8 => 4.4, creatinine 1.79 => 2.26, HCT 28.7 Labs (7/16) K 4.2, creatinine 1.98, LFTs normal, TSH normal Labs (8/16): HCT 27.1 Labs (9/16): hgb 8.1, K 4.3, creatinine 1.8, LFTs normal, TSH elevated Labs (10/16): LDL 74, HDL 45, TSH 5.19 (increased), free T3 and free T4 normal.  Labs (11/16): K 3.5, creatinine 1.47, AST 84, ALT 89 Labs (12/16): K 4.3, creatinine 1.58, AST 43, ALT 42 Labs (3/17): K 4.2, creatinine 1.4, hgb 10.7, AST 36, ALT 45 Labs (6/17): K 4, creatinine 1.69, hgb 10.9, proBNP 389, TSH mildly elevated Labs (8/17): Free T4 and T3 normal, TSH mildly elevated, K 4.2, creatinine 1.7, LFTs normal, hgb 10.4 Labs (12/17): TSH elevated but free T3 and T4 normal, K 4.5, creatinine 1.9, LDL 45, HDL 39, LFTs normal, HCT 32.9 Labs (1/18): K 4.4, creatinine 1.93 Labs (3/18): K 4.3, creatinine 1.87, BNP 187, hgb 11.1, LDL 75, LFTs normal Labs (4/18): K 4.1, creatinine 1.7 Labs (9/18): K 4, creatinine 1.8, hgb 11.9 Labs (10/18): free T4 0.4, free T3 1.9, TSH 139  Labs (12/18): K 3.4, creatinine 1.76, hgb 12.2 Labs (1/19): K 4 => 4.9, creatinine 2.7 => 2.97, TSH 179, LFTs  normal Labs (2/19): K 4.6, creatinine 2.19 Labs (4/19): LDL 61, HDL 36, K 3.9, creatinine 1.77, LFTs normal  Labs (6/19): hgb 11.7, K 3.8, creatinine 2.09, TSH 10 Labs (9/19): K 3.9, creatinine 1.69 Labs (12/19): K 3.5, creatinine 1.69, LDL 75, HDL 39, TSH normal Labs (7/20): K 3.8, creatinine 1.69 Labs (8/20): K 4, creatinine 1.63, hgb 12.1 Labs (12/20): K 3.9, creatinine 2.05, AST 44, ALT 58, hgb 12.9, TSH normal Labs (2/21): K 4, creatinine 1.99, hgb 12.5, plts 133, LDL 58, HDL 34, AST 72, ALT 87 Labs (8/21): K 3.9, creatinine 1.76 Labs (9/21): K 3.9, creatinine 2.08, LFTs normal, LDL 42 Labs (10/21): K 3.8, creatinine 2.4 (GFR 25)  PMH: 1. CKD stage  3 2. HTN 3. Hyperlipidemia 4. AAA: s/p surgery with left renal artery bypass in 1997 - Abdominal US 10/18 with 3.2 AAA 5. GERD 6. IBS 7. Melanoma s/p resection 8. Diabetes: Diet-controlled 9. Carotid stenosis: Carotid dopplers (4/15) with 40-59% bilateral stenosis. Carotid dopplers (7/16) with < 40% BICA stenosis.  Carotid dopplers (10/16) with 40-59% BICA stenosis.  - Carotids (8/17) with minimal disease.  - Carotids (10/18) with 1-39% BICA stenosis.  - Carotids (1/19): 1-39% BICA stenosis 10. PAD: 9/14 ABIs 0.91 right 1.09 left.  7/16 ABIs 0.86 right, 1.1 left; aortoiliac duplex with < 50% bilateral iliac stenosis.  - Aortoiliac duplex (8/17) with < 50% bilateral iliac stenosis.   - ABIs (10/17): left 0.91, right 0.98, left TBI 0.44 (abnormal).  - ABIs (7/18): right 0.97, right 0.75 - ABIs (1/19): right 1.04, left 1.03 - ABIs (7/19): right 1.13, noncompressible left ABI but TBI 0.93 - Peripheral angiogram (8/21): Multiple left SFA stenoses > 50% but no occlusion. The right SFA was occluded with recanalization.  No interventional option.  11. CAD: Has super-dominant RCA. s/p CABG 1992.  He had Taxus DES to mid PDA in 5/02. LHC (1/05) with 90% ostial PDA (had DES to this vessel), totally occluded RIMA-PDA, totally occluded PLV,  sequential SVG-PLV with 1 branch occluded.  LHC (11/15) with left main off the right cusp, 40-50% ostial left main, total occlusion of the proximal LAD (small vessel) with right to left collaterals, large super-dominant RCA with 50% ISR mid RCA, 50% ostial PDA, patent SVG-PLV, RIMA-PDA known atretic. Lexiscan Cardiolite (6/16) with EF 42%, prior anterolateral MI, no ischemia.  - Lexiscan Cardiolite (3/18): EF 51%, basal to mid anterolateral fixed defect with no ischemia.  12. Ischemic cardiomyopathy: Echo (12/09) with EF 45-50%, basal to mid inferolateral hypokinesis. Echo (6/15) with EF 45-50%, akinesis of the mid to apical inferolateral wall.  Echo (6/16) with EF 50-55%, mild LVH, inferior hypokinesis, mild MR.  Echo (10/16) with EF 40-45%, moderate LVH, mildly decreased RV systolic function.  - Echo (3/18) with EF 40-45%, mid anterior hypokinesis.  - Echo (2/19) with EF 60-65%, mild LVH, trade II diastolic dysfunction.  - Echo (8/20): EF 45-50%, anterolateral hypokinesis, normal RV size and systolic function.  - Echo (9/21): EF 40-45%, anterolateral hypokinesis, normal RV, mild mitral stenosis with mean gradient 7 mmHg, IVC dilated.  13. OA 14. Atrial fibrillation/flutter: Paroxysmal.  Noted during 5/16 hospitalization in [place] and in 6/16.  He developed atrial flutter in 8/16, back in NSR by 9/16.  15. Emphysema: PFTs (8/16) with FVC 88%, FEV1 74%, ratio 81%, TLC 83%, DLCO 31%. - PFTs (12/18): FVC 64%, FEV1 69%, ratio 105%, TLC 82%, DLCO 33% => mild obstruction, severely decreased DLCO (similar to prior).   16. Renal cysts 17. Idiopathic peripheral neuropathy.  18. Anemia.  79. 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  20. Hernia repair (11/18) 21. Hypothyroidism 22. Bronchiectasis: CT chest 1/19 with bronchiectasis.  - Aspiration PNA and COPD exacerbation in 6/21.  23. Lung nodule: 1.3 cm RML nodule on 1/19 CT, was noted in 2014 => slow growing. Possibly malignant.  Decision made with pulmonary to watch  and avoid biopsy.  - CT chest (7/20) with stable RML nodule, no evidence for amiodarone toxicity.    Current Outpatient Medications  Medication Sig Dispense Refill  . acetaminophen (TYLENOL) 500 MG tablet Take 2 tablets (1,000 mg total) by mouth every 8 (eight) hours as needed. 30 tablet 0  . amiodarone (PACERONE) 200 MG tablet Take 0.5 tablets (100  mg total) by mouth daily. 45 tablet 3  . atorvastatin (LIPITOR) 80 MG tablet Take 1 tablet (80 mg total) by mouth daily. (Patient taking differently: Take 80 mg by mouth every evening.) 30 tablet 3  . ELIQUIS 2.5 MG TABS tablet TAKE 1 TABLET BY MOUTH TWICE DAILY. 180 tablet 1  . ezetimibe (ZETIA) 10 MG tablet TAKE (1) TABLET DAILY. (Patient taking differently: Take 10 mg by mouth daily.) 90 tablet 0  . FLUoxetine (PROZAC) 20 MG capsule TAKE (1) CAPSULE DAILY. (Patient taking differently: Take 20 mg by mouth daily.) 90 capsule 0  . gabapentin (NEURONTIN) 100 MG capsule Take 100 mg by mouth 3 (three) times daily.    . hydrALAZINE (APRESOLINE) 50 MG tablet Take 1 tablet (50 mg total) by mouth 3 (three) times daily. 90 tablet 3  . isosorbide mononitrate (IMDUR) 30 MG 24 hr tablet Take 1 tablet (30 mg total) by mouth daily. 30 tablet 3  . levothyroxine (SYNTHROID) 100 MCG tablet TAKE 1 TABLET BEFORE BREAKFAST. (Patient taking differently: Take 100 mcg by mouth daily before breakfast.) 90 tablet 0  . metolazone (ZAROXOLYN) 2.5 MG tablet Take 1 tablet (2.5 mg total) by mouth as directed. By HF Clinic 3 tablet 0  . Multiple Vitamins-Minerals (MULTIVITAMIN ADULTS 50+) TABS Take 1 tablet by mouth daily in the afternoon.    . nitroGLYCERIN (NITROSTAT) 0.4 MG SL tablet DISSOLVE 1 TABLET UNDER TONGUE AS NEEDED FOR CHEST PAIN,MAY REPEAT IN5 MINUTES FOR 2 DOSES. 25 tablet 3  . pantoprazole (PROTONIX) 40 MG tablet Take 1 tablet (40 mg total) by mouth 2 (two) times daily.    . potassium chloride SA (KLOR-CON) 20 MEQ tablet Take 20 mEq by mouth daily.    Marland Kitchen tiotropium  (SPIRIVA) 18 MCG inhalation capsule Place 18 mcg into inhaler and inhale daily as needed (for wheezing).    . torsemide (DEMADEX) 20 MG tablet Take 4 tablets (80 mg total) by mouth every morning AND 2 tablets (40 mg total) every evening. 180 tablet 11  . carvedilol (COREG) 3.125 MG tablet Take 1 tablet (3.125 mg total) by mouth 2 (two) times daily with a meal. 60 tablet 3  . empagliflozin (JARDIANCE) 10 MG TABS tablet Take 1 tablet (10 mg total) by mouth daily before breakfast. 90 tablet 3   No current facility-administered medications for this encounter.    Allergies:   Metoprolol, Metformin, and Metformin and related   Social History:  The patient  reports that he has never smoked. He has never used smokeless tobacco. He reports that he does not drink alcohol and does not use drugs.   Family History:  The patient's family history includes Colon cancer in his father; Coronary artery disease in his brother; Diabetes in his brother and son; Heart disease in his brother; Hyperlipidemia in his brother and son; Peripheral vascular disease in his brother; Throat cancer in his brother.   ROS:  Please see the history of present illness.   All other systems are personally reviewed and negative.   Vitals:   BP (!) 104/50   Pulse (!) 45   Wt 77.6 kg (171 lb)   SpO2 94%   BMI 26.78 kg/m  PHYSICAL EXAM: General:  Well appearing elderly male, in wheel chair. No respiratory difficulty HEENT: normal Neck: supple. no JVD. Carotids 2+ bilat; no bruits. No lymphadenopathy or thyromegaly appreciated. Cor: PMI nondisplaced. Regular rhythm, slow rate. No rubs, gallops or murmurs. Lungs: clear, no wheezing  Abdomen: soft, nontender, nondistended. No hepatosplenomegaly.  No bruits or masses. Good bowel sounds. Extremities: no cyanosis, clubbing, rash, mild bilateral ankle edema, bilateral extensive varicose veins Neuro: alert & oriented x 3, cranial nerves grossly intact. moves all 4 extremities w/o  difficulty. Affect pleasant.   Recent Labs: 01/14/2021: TSH 1.984 02/20/2021: B Natriuretic Peptide 1,833.3 02/21/2021: ALT 33; Magnesium 2.7 02/23/2021: Hemoglobin 9.8; Platelets 133 03/09/2021: BUN 64; Creatinine, Ser 2.17; Potassium 3.8; Sodium 139  Personally reviewed   Wt Readings from Last 3 Encounters:  03/09/21 77.6 kg (171 lb)  02/23/21 77.6 kg (171 lb 1.6 oz)  01/14/21 79.1 kg (174 lb 6.4 oz)      ASSESSMENT AND PLAN:  1. CAD: s/p CABG.  Cath in 11/15 showed occluded LAD with left to right collaterals.  The LAD was a relatively small vessel (super-dominant right).  Managed medically. Lexiscan Cardiolite in 6/16 with infarction but no ischemia.  Cardiolite 3/18 with infarction, no ischemia. EF dropped on recent echo 2/22 down to 25-30% however no plans for ischemic w/u given lack of CP and advanced age. Pt also declining invasive procedures.  - He continues to deny CP.  - Continue medical therapy  - Continue atorvastatin, he is not on ASA given use of Eliquis.  - reducing Coreg to 3.125 mg bid due to bradycardia  2. Chronic heart failure with mid range EF/ischemic cardiomyopathy: EF 40-45% on 3/18 echo.  Echo 8/20 with EF 45-50%, normal RV. Echo in 9/21 with EF 40-45%, mild mitral stenosis. Recent admit 2/22 for a/c CHF.  Repeat echo EF lower, 25-30%, RV ok. HS trop 36>>73. No recent CP. Pt declined cath.  - Volume status ok on exam today, NYHA Class II.   - Continue torsemide  80 qam/40 qpm  - Continue Jardiance 10 mg daily.    - Continue hydralazine 50 mg tid.    - Continue Imdur 30 mg daily.    - Reduce Coreg to 3.125 mg bid given bradycardia  - Not on ACEi/ARB/spironolactone with elevated creatinine.    - Check BMP today  3. Carotid stenosis: Stable. Due for repeat carotid dopplers, needs to make followup at [facility].   4. PAD: Severe bilateral SFA disease on 8/21 peripheral angiogram, no revascularization options.  Leg ulcers now healed, no longer going to wound clinic.  Minimal  claudication. Cilostazol recently discontinued 2/22 given CHF. Stable w/o worsening symptoms.  - Continue w/u w/ [facility]  5. Hyperlipidemia: Continue atorvastatin, good lipids in 9/21.  6. CKD III:  Check BMP today .  7. Atrial fibrillation/flutter:  Paroxysmal.  Sinus brady on EKG today .  - Continue amiodarone 100 mg daily. Will need to get regular eye exams with amiodarone use.  CT chest in 7/20 did not appear to show evidence for amiodarone lung toxicity. Recent LFTs and TSH ok.  He will need regular eye exam.     - Continue Eliquis 2.5 bid. Denies abnormal bleeding  - Reduce Coreg to 3.125 mg bid w/ bradycardia and soft BP - Repeat EKG in 1 week   8. Suspected OSA:  Has declined sleep study.   9. COPD: He is now off oxygen. He has bronchiectasis thought to be due to chronic aspiration.  - Follows with pulmonary.  10. CVA: Cerebellar, 10/16.  He has had resulting imbalance.  - He is no longer driving.  11. Hypertension: BP controlled on current regimen.  12. Hypothyroidism: Likely related to amiodarone. - Followed by PCP.  13. Pulmonary nodule: 1.3 cm RML nodule on 1/19 CT chest.  It was also seen in 2014 => slow growing.  Dr Lake Bells has discussed with patient and wife and they have decided not to go forward with biopsy. They would not be interested in lung cancer treatment.  CT chest in 7/20 showed stable 1 cm RML nodule.  14. Bradycardia: marked sinus brady on EKG 44 bpm. Asymptomatic.  - reduce Coreg to 3.125 mg bid - return in 1 week for RN visit for repeat EKG. May need to discontinue  blocker if persists vs stopping amiodarone  F/u RN visit in 1 week for repeat EKG to assess for bradycardia  F/u w/ Dr. [name] in 2-3 months.  Continue w/ paramedicine  Signed, Lyda Jester, PA-C  03/09/2021  [facility] [place] [address] [phone] (office) [phone] (fax)

## 2021-03-09 NOTE — Patient Instructions (Signed)
Decrease Coreg to 3.125 mg, one tab twice a day  Labs today We will only contact you if something comes back abnormal or we need to make some changes. Otherwise no news is good news!  Your physician recommends that you schedule a follow-up appointment in: 1 week for nurse visit/EKG  Do the following things EVERYDAY: 1) Weigh yourself in the morning before breakfast. Write it down and keep it in a log. 2) Take your medicines as prescribed 3) Eat low salt foods--Limit salt (sodium) to 2000 mg per day.  4) Stay as active as you can everyday 5) Limit all fluids for the day to less than 2 liters At the [facility], you and your health needs are our priority. As part of our continuing mission to provide you with exceptional heart care, we have created designated Provider Care Teams. These Care Teams include your primary Cardiologist (physician) and Advanced Practice Providers (APPs- Physician Assistants and Nurse Practitioners) who all work together to provide you with the care you need, when you need it.   You may see any of the following providers on your designated Care Team at your next follow up: Marland Kitchen Dr Glori Bickers . Dr Loralie Champagne . Dr Vickki Muff . Darrick Grinder, NP . Lyda Jester, [place] . Audry Riles, PharmD   Please be sure to bring in all your medications bottles to every appointment.   If you have any questions or concerns before your next appointment please send [place] a message through [place] or call our office at [phone].    TO LEAVE A MESSAGE FOR THE NURSE SELECT OPTION 2, PLEASE LEAVE A MESSAGE INCLUDING: . YOUR NAME . DATE OF BIRTH . CALL BACK NUMBER . REASON FOR CALL**this is important as we prioritize the call backs  YOU WILL RECEIVE A CALL BACK THE SAME DAY AS LONG AS YOU CALL BEFORE 4:00 PM

## 2021-03-10 ENCOUNTER — Telehealth: Payer: Self-pay

## 2021-03-10 ENCOUNTER — Other Ambulatory Visit (HOSPITAL_COMMUNITY): Payer: Self-pay

## 2021-03-10 DIAGNOSIS — S81801D Unspecified open wound, right lower leg, subsequent encounter: Secondary | ICD-10-CM | POA: Diagnosis not present

## 2021-03-10 DIAGNOSIS — N184 Chronic kidney disease, stage 4 (severe): Secondary | ICD-10-CM | POA: Diagnosis not present

## 2021-03-10 DIAGNOSIS — J449 Chronic obstructive pulmonary disease, unspecified: Secondary | ICD-10-CM | POA: Diagnosis not present

## 2021-03-10 DIAGNOSIS — I872 Venous insufficiency (chronic) (peripheral): Secondary | ICD-10-CM | POA: Diagnosis not present

## 2021-03-10 DIAGNOSIS — E1122 Type 2 diabetes mellitus with diabetic chronic kidney disease: Secondary | ICD-10-CM | POA: Diagnosis not present

## 2021-03-10 DIAGNOSIS — E1151 Type 2 diabetes mellitus with diabetic peripheral angiopathy without gangrene: Secondary | ICD-10-CM | POA: Diagnosis not present

## 2021-03-10 DIAGNOSIS — I255 Ischemic cardiomyopathy: Secondary | ICD-10-CM | POA: Diagnosis not present

## 2021-03-10 DIAGNOSIS — I130 Hypertensive heart and chronic kidney disease with heart failure and stage 1 through stage 4 chronic kidney disease, or unspecified chronic kidney disease: Secondary | ICD-10-CM | POA: Diagnosis not present

## 2021-03-10 DIAGNOSIS — I480 Paroxysmal atrial fibrillation: Secondary | ICD-10-CM | POA: Diagnosis not present

## 2021-03-10 NOTE — Progress Notes (Signed)
Came today for med rec post clinic visit yesterday.  Holding torsemide and will resume on Friday morning since he didn't hold it Tuesday afternoon.  Held it on Wednesday along with potassium.  His carvedilol was cut back to 3.125mg  BID.     Needs refills on [id number] [phone] [id number] Isosorbide-[id number] Pantoprazole-[id number] [place], EMT-Paramedic  03/10/21

## 2021-03-10 NOTE — Telephone Encounter (Signed)
See note

## 2021-03-10 NOTE — Telephone Encounter (Signed)
.  Home Health verbal orders-caller/Agency: Ann Maki number: [phone]  Requesting OT/PT/Skilled nursing/Social Work/Speech: PT    Reason:Strenghtening and gate and balancing training   Frequency: 1 week 1  2 week 2  1 week 1

## 2021-03-11 DIAGNOSIS — S81801D Unspecified open wound, right lower leg, subsequent encounter: Secondary | ICD-10-CM | POA: Diagnosis not present

## 2021-03-11 DIAGNOSIS — I480 Paroxysmal atrial fibrillation: Secondary | ICD-10-CM | POA: Diagnosis not present

## 2021-03-11 DIAGNOSIS — N184 Chronic kidney disease, stage 4 (severe): Secondary | ICD-10-CM | POA: Diagnosis not present

## 2021-03-11 DIAGNOSIS — E1122 Type 2 diabetes mellitus with diabetic chronic kidney disease: Secondary | ICD-10-CM | POA: Diagnosis not present

## 2021-03-11 DIAGNOSIS — I255 Ischemic cardiomyopathy: Secondary | ICD-10-CM | POA: Diagnosis not present

## 2021-03-11 DIAGNOSIS — I130 Hypertensive heart and chronic kidney disease with heart failure and stage 1 through stage 4 chronic kidney disease, or unspecified chronic kidney disease: Secondary | ICD-10-CM | POA: Diagnosis not present

## 2021-03-11 DIAGNOSIS — I872 Venous insufficiency (chronic) (peripheral): Secondary | ICD-10-CM | POA: Diagnosis not present

## 2021-03-11 DIAGNOSIS — E1151 Type 2 diabetes mellitus with diabetic peripheral angiopathy without gangrene: Secondary | ICD-10-CM | POA: Diagnosis not present

## 2021-03-11 DIAGNOSIS — J449 Chronic obstructive pulmonary disease, unspecified: Secondary | ICD-10-CM | POA: Diagnosis not present

## 2021-03-11 NOTE — Telephone Encounter (Signed)
VO given to [employer]

## 2021-03-11 NOTE — Telephone Encounter (Signed)
Ok with me. Please place any necessary orders. 

## 2021-03-12 ENCOUNTER — Encounter (HOSPITAL_BASED_OUTPATIENT_CLINIC_OR_DEPARTMENT_OTHER): Payer: Medicare PPO | Admitting: Internal Medicine

## 2021-03-12 ENCOUNTER — Other Ambulatory Visit: Payer: Self-pay

## 2021-03-12 DIAGNOSIS — L97812 Non-pressure chronic ulcer of other part of right lower leg with fat layer exposed: Secondary | ICD-10-CM | POA: Diagnosis not present

## 2021-03-12 DIAGNOSIS — Z7901 Long term (current) use of anticoagulants: Secondary | ICD-10-CM | POA: Diagnosis not present

## 2021-03-12 DIAGNOSIS — E11621 Type 2 diabetes mellitus with foot ulcer: Secondary | ICD-10-CM | POA: Diagnosis not present

## 2021-03-12 DIAGNOSIS — I87333 Chronic venous hypertension (idiopathic) with ulcer and inflammation of bilateral lower extremity: Secondary | ICD-10-CM | POA: Diagnosis not present

## 2021-03-12 DIAGNOSIS — E1151 Type 2 diabetes mellitus with diabetic peripheral angiopathy without gangrene: Secondary | ICD-10-CM | POA: Diagnosis not present

## 2021-03-12 DIAGNOSIS — I872 Venous insufficiency (chronic) (peripheral): Secondary | ICD-10-CM | POA: Diagnosis not present

## 2021-03-12 DIAGNOSIS — L97822 Non-pressure chronic ulcer of other part of left lower leg with fat layer exposed: Secondary | ICD-10-CM | POA: Diagnosis not present

## 2021-03-12 DIAGNOSIS — I480 Paroxysmal atrial fibrillation: Secondary | ICD-10-CM | POA: Diagnosis not present

## 2021-03-12 DIAGNOSIS — L601 Onycholysis: Secondary | ICD-10-CM | POA: Diagnosis not present

## 2021-03-12 DIAGNOSIS — J449 Chronic obstructive pulmonary disease, unspecified: Secondary | ICD-10-CM | POA: Diagnosis not present

## 2021-03-12 NOTE — Progress Notes (Addendum)
ALPHONZA, TRAMELL ([id number]) Visit Report for 03/12/2021 Arrival Information Details Patient Name: Date of Service: Andrew [name], GEO [facility] A. 03/12/2021 12:30 PM Medical Record Number: [id number] Patient Account Number: [id number] Date of Birth/Sex: Treating RN: 02-03-1928 (87 y.o. Lorette Ang, Meta.Reding Primary Care Viyaan Champine: Dimas Chyle Other Clinician: Referring Akeen Ledyard: Treating Raschelle Wisenbaker/Extender: Donalee Citrin in Treatment: 43 Visit Information History Since Last Visit Added or deleted any medications: No Patient Arrived: Wheel Chair Any new allergies or adverse reactions: No Arrival Time: 12:55 Had a fall or experienced change in No Accompanied By: wife activities of daily living that may affect Transfer Assistance: None risk of falls: Patient Identification Verified: Yes Signs or symptoms of abuse/neglect since last visito No Secondary Verification Process Completed: Yes Hospitalized since last visit: No Patient Requires Transmission-Based Precautions: No Implantable device outside of the clinic excluding No Patient Has Alerts: No cellular tissue based products placed in the center since last visit: Has Dressing in Place as Prescribed: No Has Compression in Place as Prescribed: No Pain Present Now: No Electronic Signature(s) Signed: 03/12/2021 5:28:19 PM By: Deon Pilling Entered By: Deon Pilling on 03/12/2021 13:01:36 -------------------------------------------------------------------------------- Lower Extremity Assessment Details Patient Name: Date of Service: Andrew [name], [name] 03/12/2021 12:30 PM Medical Record Number: [id number] Patient Account Number: [id number] Date of Birth/Sex: Treating RN: 1928/07/09 (87 y.o. [name] Primary Care Haidy Kackley: Dimas Chyle Other Clinician: Referring Simisola Sandles: Treating Annely Sliva/Extender: Donalee Citrin in Treatment: 26 Edema Assessment Assessed: Shirlyn Goltz: Yes] Patrice Paradise: Yes] Edema:  [Left: No] [Right: No] Calf Left: Right: Point of Measurement: 44 cm From Medial Instep 31 cm 32 cm Ankle Left: Right: Point of Measurement: 10 cm From Medial Instep 22.5 cm 23 cm Electronic Signature(s) Signed: 03/12/2021 5:28:19 PM By: Deon Pilling Entered By: Deon Pilling on 03/12/2021 13:02:06 -------------------------------------------------------------------------------- Multi Wound Chart Details Patient Name: Date of Service: Andrew [name], [name] 03/12/2021 12:30 PM Medical Record Number: [id number] Patient Account Number: [id number] Date of Birth/Sex: Treating RN: 1928-09-29 (87 y.o. [name] Primary Care Khye Hochstetler: Dimas Chyle Other Clinician: Referring Aum Caggiano: Treating Horrace Hanak/Extender: Donalee Citrin in Treatment: 26 Vital Signs Height(in): 87 Pulse(bpm): 60 Weight(lbs): 185 Blood Pressure(mmHg): 102/53 Body Mass Index(BMI): 29 Temperature(F): 97.6 Respiratory Rate(breaths/min): 18 Photos: [4:No Photos Right, Proximal, Anterior Lower Leg Left, Medial Lower Leg] [6:No Photos] [N/A:N/A N/A] Wound Location: [4:Gradually Appeared] [6:Other Lesion] [N/A:N/A] Wounding Event: [4:Venous Leg Ulcer] [6:Venous Leg Ulcer] [N/A:N/A] Primary Etiology: [4:Congestive Heart Failure, Coronary Congestive Heart Failure, Coronary N/A] Comorbid History: [4:Artery Disease, Hypertension, Peripheral Arterial Disease, Peripheral Peripheral Arterial Disease, Peripheral Venous Disease, Type II Diabetes 10/09/2020] [6:Artery Disease, Hypertension, Venous Disease, Type II Diabetes 03/12/2021]  [N/A:N/A] Date Acquired: [4:22] [6:0] [N/A:N/A] Weeks of Treatment: [4:Open] [6:Open] [N/A:N/A] Wound Status: [4:0.2x0.2x0.1] [6:0.5x1x0.1] [N/A:N/A] Measurements L x W x D (cm) [4:0.031] [6:0.393] [N/A:N/A] A (cm) : rea [4:0.003] [6:0.039] [N/A:N/A] Volume (cm) : [4:67.00%] [6:N/A] [N/A:N/A] % Reduction in A [4:rea: 66.70%] [6:N/A] [N/A:N/A] % Reduction in  Volume: [4:Full Thickness Without Exposed] [6:Full Thickness Without Exposed] [N/A:N/A] Classification: [4:Support Structures Small] [6:Support Structures Medium] [N/A:N/A] Exudate A mount: [4:Serosanguineous] [6:Sanguinous] [N/A:N/A] Exudate Type: [4:red, brown] [6:red] [N/A:N/A] Exudate Color: [4:Flat and Intact] [6:Distinct, outline attached] [N/A:N/A] Wound Margin: [4:Large (67-100%)] [6:Large (67-100%)] [N/A:N/A] Granulation A mount: [4:Red] [6:Red] [N/A:N/A] Granulation Quality: [4:None Present (0%)] [6:None Present (0%)] [N/A:N/A] Necrotic A mount: [4:Fat Layer (Subcutaneous Tissue): Yes Fat Layer (Subcutaneous Tissue): Yes N/A] Exposed Structures: [4:Fascia: No Tendon: No Muscle: No Joint: No Bone: No Large (  67-100%)] [6:Fascia: No Tendon: No Muscle: No Joint: No Bone: No None] [N/A:N/A] Epithelialization: [4:N/A] [6:Debridement - Selective/Open Wound N/A] Debridement: Pre-procedure Verification/Time Out N/A [6:13:50] [N/A:N/A] Taken: [4:N/A] [6:Skin/Dermis] [N/A:N/A] Level: [4:N/A] [6:0.5] [N/A:N/A] Debridement A (sq cm): [4:rea N/A] [6:Curette] [N/A:N/A] Instrument: [4:N/A] [6:Minimum] [N/A:N/A] Bleeding: [4:N/A] [6:Pressure] [N/A:N/A] Hemostasis Achieved: [4:N/A] [6:Procedure was tolerated well] [N/A:N/A] Debridement Treatment Response: [4:N/A] [6:0.5x1x0.1] [N/A:N/A] Post Debridement Measurements L x W x D (cm) [4:N/A] [6:0.039] [N/A:N/A] Post Debridement Volume: (cm) [4:N/A] [6:Debridement] [N/A:N/A] Treatment Notes Electronic Signature(s) Signed: 03/12/2021 5:12:39 PM By: Baruch Gouty RN, BSN Signed: 03/12/2021 5:26:06 PM By: Linton Ham MD Entered By: Linton Ham on 03/12/2021 14:27:42 -------------------------------------------------------------------------------- Multi-Disciplinary Care Plan Details Patient Name: Date of Service: Andrew [name], [name] 03/12/2021 12:30 PM Medical Record Number: [id number] Patient Account Number: [id number] Date of  Birth/Sex: Treating RN: 09/16/1928 (87 y.o. Marcheta Grammes Primary Care Novali Vollman: Dimas Chyle Other Clinician: Referring Lyllian Gause: Treating Brannen Koppen/Extender: Donalee Citrin in Treatment: 26 Multidisciplinary Care Plan reviewed with physician Active Inactive Wound/Skin Impairment Nursing Diagnoses: Impaired tissue integrity Goals: Patient/caregiver will verbalize understanding of skin care regimen Date Initiated: 10/23/2020 Target Resolution Date: 03/26/2021 Goal Status: Active Ulcer/skin breakdown will have a volume reduction of 50% by week 8 Date Initiated: 09/11/2020 Date Inactivated: 10/23/2020 Target Resolution Date: 10/16/2020 Goal Status: Unmet Unmet Reason: PAD Interventions: Provide education on ulcer and skin care Notes: Electronic Signature(s) Signed: 03/12/2021 12:51:09 PM By: Lorrin Jackson Entered By: Lorrin Jackson on 03/12/2021 12:51:08 -------------------------------------------------------------------------------- Pain Assessment Details Patient Name: Date of Service: Andrew [name], [name] 03/12/2021 12:30 PM Medical Record Number: [id number] Patient Account Number: [id number] Date of Birth/Sex: Treating RN: 1928-08-11 (87 y.o. [name] Primary Care Dayon Witt: Dimas Chyle Other Clinician: Referring Brean Carberry: Treating Edwena Mayorga/Extender: Donalee Citrin in Treatment: 26 Active Problems Location of Pain Severity and Description of Pain Patient Has Paino No Site Locations Rate the pain. Rate the pain. Current Pain Level: 0 Pain Management and Medication Current Pain Management: Medication: No Cold Application: No Rest: No Massage: No Activity: No T.E.N.S.: No Heat Application: No Leg drop or elevation: No Is the Current Pain Management Adequate: Adequate How does your wound impact your activities of daily livingo Sleep: No Bathing: No Appetite: No Relationship With Others: No Bladder  Continence: No Emotions: No Bowel Continence: No Work: No Toileting: No Drive: No Dressing: No Hobbies: No Electronic Signature(s) Signed: 03/12/2021 5:28:19 PM By: Deon Pilling Entered By: Deon Pilling on 03/12/2021 13:01:53 -------------------------------------------------------------------------------- Patient/Caregiver Education Details Patient Name: Date of Service: Andrew [name], [name] 3/18/2022andnbsp12:30 PM Medical Record Number: [id number] Patient Account Number: [id number] Date of Birth/Gender: Treating RN: Feb 24, 1928 (87 y.o. Marcheta Grammes Primary Care Physician: Dimas Chyle Other Clinician: Referring Physician: Treating Physician/Extender: Donalee Citrin in Treatment: 26 Education Assessment Education Provided To: Patient Education Topics Provided Venous: Methods: Explain/Verbal, Printed Responses: State content correctly Wound/Skin Impairment: Methods: Explain/Verbal, Printed Responses: State content correctly Electronic Signature(s) Signed: 03/12/2021 5:48:33 PM By: Lorrin Jackson Signed: 03/12/2021 5:48:33 PM By: Lorrin Jackson Entered By: Lorrin Jackson on 03/12/2021 12:51:31 -------------------------------------------------------------------------------- Wound Assessment Details Patient Name: Date of Service: Andrew [name], [name] 03/12/2021 12:30 PM Medical Record Number: [id number] Patient Account Number: [id number] Date of Birth/Sex: Treating RN: [birth year] (87 y.o. [name] Primary Care Kassandra Meriweather: Dimas Chyle Other Clinician: Referring Rolla Servidio: Treating Masin Shatto/Extender: Donalee Citrin in Treatment: 26 Wound Status Wound Number: 4 Primary Venous Leg Ulcer Etiology: Wound Location: Right, Proximal, Anterior  Lower Leg Wound Open Wounding Event: Gradually Appeared Status: Date Acquired: 10/09/2020 Comorbid Congestive Heart Failure, Coronary Artery Disease, Hypertension, Weeks Of  Treatment: 22 History: Peripheral Arterial Disease, Peripheral Venous Disease, Type II Clustered Wound: No Diabetes Photos Wound Measurements Length: (cm) 0.2 Width: (cm) 0.2 Depth: (cm) 0.1 Area: (cm) 0.031 Volume: (cm) 0.003 % Reduction in Area: 67% % Reduction in Volume: 66.7% Epithelialization: Large (67-100%) Tunneling: No Undermining: No Wound Description Classification: Full Thickness Without Exposed Support Structures Wound Margin: Flat and Intact Exudate Amount: Small Exudate Type: Serosanguineous Exudate Color: red, brown Foul Odor After Cleansing: No Slough/Fibrino No Wound Bed Granulation Amount: Large (67-100%) Exposed Structure Granulation Quality: Red Fascia Exposed: No Necrotic Amount: None Present (0%) Fat Layer (Subcutaneous Tissue) Exposed: Yes Tendon Exposed: No Muscle Exposed: No Joint Exposed: No Bone Exposed: No Electronic Signature(s) Signed: 03/15/2021 7:47:40 AM By: Sandre Kitty Signed: 03/15/2021 4:58:53 PM By: Deon Pilling Previous Signature: 03/12/2021 5:28:19 PM Version By: Deon Pilling Entered By: Sandre Kitty on 03/14/2021 12:44:20 -------------------------------------------------------------------------------- Wound Assessment Details Patient Name: Date of Service: Andrew [name], [name] 03/12/2021 12:30 PM Medical Record Number: [id number] Patient Account Number: [id number] Date of Birth/Sex: Treating RN: 01-Dec-1928 (87 y.o. Marcheta Grammes Primary Care Jermeka Schlotterbeck: Dimas Chyle Other Clinician: Referring Tawfiq Favila: Treating Lavonte Palos/Extender: Donalee Citrin in Treatment: 26 Wound Status Wound Number: 6 Primary Venous Leg Ulcer Etiology: Wound Location: Left, Medial Lower Leg Wound Open Wounding Event: Other Lesion Status: Date Acquired: 03/12/2021 Comorbid Congestive Heart Failure, Coronary Artery Disease, Hypertension, Weeks Of Treatment: 0 History: Peripheral Arterial Disease, Peripheral Venous  Disease, Type II Clustered Wound: No Diabetes Wound Measurements Length: (cm) 0.5 Width: (cm) 1 Depth: (cm) 0.1 Area: (cm) 0.393 Volume: (cm) 0.039 % Reduction in Area: % Reduction in Volume: Epithelialization: None Tunneling: No Undermining: No Wound Description Classification: Full Thickness Without Exposed Support Structures Wound Margin: Distinct, outline attached Exudate Amount: Medium Exudate Type: Sanguinous Exudate Color: red Foul Odor After Cleansing: No Slough/Fibrino No Wound Bed Granulation Amount: Large (67-100%) Exposed Structure Granulation Quality: Red Fascia Exposed: No Necrotic Amount: None Present (0%) Fat Layer (Subcutaneous Tissue) Exposed: Yes Tendon Exposed: No Muscle Exposed: No Joint Exposed: No Bone Exposed: No Electronic Signature(s) Signed: 03/12/2021 5:48:33 PM By: Lorrin Jackson Entered By: Lorrin Jackson on 03/12/2021 13:53:24 -------------------------------------------------------------------------------- Vitals Details Patient Name: Date of Service: Andrew [name], [name] 03/12/2021 12:30 PM Medical Record Number: [id number] Patient Account Number: [id number] Date of Birth/Sex: Treating RN: Nov 09, 1928 (87 y.o. [name] Primary Care Dalvin Clipper: Dimas Chyle Other Clinician: Referring Hy Swiatek: Treating Joleena Weisenburger/Extender: Donalee Citrin in Treatment: 26 Vital Signs Time Taken: 12:55 Temperature (F): 97.6 Height (in): 67 Pulse (bpm): 60 Weight (lbs): 185 Respiratory Rate (breaths/min): 18 Body Mass Index (BMI): 29 Blood Pressure (mmHg): 102/53 Reference Range: 80 - 120 mg / dl Electronic Signature(s) Signed: 03/12/2021 5:28:19 PM By: Deon Pilling Entered By: Deon Pilling on 03/12/2021 13:03:18

## 2021-03-12 NOTE — Progress Notes (Signed)
JAKIM, DRAPEAU ([id number]) Visit Report for 03/12/2021 Debridement Details Patient Name: Date of Service: Andrew [name], [name] 03/12/2021 12:30 PM Medical Record Number: [id number] Patient Account Number: [id number] Date of Birth/Sex: Treating RN: 03-Dec-1928 (87 y.o. [name] Primary Care Provider: Dimas Chyle Other Clinician: Referring Provider: Treating Provider/Extender: Donalee Citrin in Treatment: 26 Debridement Performed for Assessment: Wound #6 Left,Medial Lower Leg Performed By: Physician Ricard Dillon., MD Debridement Type: Debridement Severity of Tissue Pre Debridement: Fat layer exposed Level of Consciousness (Pre-procedure): Awake and Alert Pre-procedure Verification/Time Out Yes - 13:50 Taken: Start Time: 13:51 T Area Debrided (L x W): otal 0.5 (cm) x 1 (cm) = 0.5 (cm) Tissue and other material debrided: Non-Viable, Skin: Dermis , Fibrin/Exudate Level: Skin/Dermis Debridement Description: Selective/Open Wound Instrument: Curette Bleeding: Minimum Hemostasis Achieved: Pressure End Time: 13:54 Response to Treatment: Procedure was tolerated well Level of Consciousness (Post- Awake and Alert procedure): Post Debridement Measurements of Total Wound Length: (cm) 0.5 Width: (cm) 1 Depth: (cm) 0.1 Volume: (cm) 0.039 Character of Wound/Ulcer Post Debridement: Stable Severity of Tissue Post Debridement: Fat layer exposed Post Procedure Diagnosis Same as Pre-procedure Electronic Signature(s) Signed: 03/12/2021 5:12:39 PM By: Baruch Gouty RN, BSN Signed: 03/12/2021 5:26:06 PM By: Linton Ham MD Entered By: Linton Ham on 03/12/2021 14:28:00 -------------------------------------------------------------------------------- HPI Details Patient Name: Date of Service: Andrew [name], [name] 03/12/2021 12:30 PM Medical Record Number: [id number] Patient Account Number: [id number] Date of Birth/Sex: Treating RN: 12/07/1928 (87  y.o. [name] Primary Care Provider: Dimas Chyle Other Clinician: Referring Provider: Treating Provider/Extender: Donalee Citrin in Treatment: 26 History of Present Illness HPI Description: 09/11/2020 ADMISSION This is a soon-to-be 87 year old man who was hospitalized in June and subsequently spent time at [facility]. At some point he developed a pressure ulcer on his left heel. On discharge he was seen by podiatry and referred to Dr. Donzetta Matters with regards to the possibility of PAD. The patient underwent angiography on 08/10/2020 this showed heavily calcified vessels. The common iliac artery on the left appeared to have calcific disease but there was no gradient. Left common external leg arteries were heavily tortuous left lower extremity limited angiography developed demonstrated a SFA to be helped heavily calcified throughout with multiple greater than 50% stenosis but never frankly occludes he was not felt to be a candidate for endovascular repair. I think they had a minimal look at the right side and it appeared that the SFA was occluded and reconstitutes above the knee. The overall feeling was that if he could not heal his left heel ulcer he will eventually best be served by a left above-knee amputation The last I can see is that he should have been using a Santyl wet-to-dry dressing to the heel. During his stay with podiatry was also noted that he had venous insufficiency and I think venous insufficiency wounds. His wife tells me that he has been recommended for compression stockings in the past but the patient refused, he has wounds on his bilateral anterior lower legs right greater than the left. Chronic venous inflammation and dry flaking skin. Past medical history; heart failure, right heel wound, paroxysmal atrial fibrillation on Eliquis, abdominal aortic aneurysm with previous repair, COPD, DVT, melanoma on his back, diabetes with a recent  hemoglobin A1c of 7.3 however he is not on any treatment [type II], PAD, coronary artery disease, hypertension The patient's most recent arterial studies were on 05/28/2020. This showed an ABI on the  right at 1.12 but monophasic waveforms and absent waveforms at the posterior tibial artery. On the left his ABI was 1.47 [noncompressible] he had triphasic waveforms at the ATA and monophasic at the PTA. His TBI on the right was 1.12 on the left and 0.97. It was felt his ABIs were falsely elevated 9/24. The patient followed up with Dr. Donzetta Matters and had noninvasive arterial studies. On the right his ABIs were noncompressible monophasic waveforms with a TBI of 1.45 and a right great toe pressure of 172. On the left he had monophasic waveforms with noncompressible ABIs TBI of 0.79 and a left great toe pressure of 94. He will be seen in follow-up by Dr. Donzetta Matters in a month's time. The patient also has very significant chronic venous insufficiency 10/8; patient with known PAD. He came in with bilateral lower extremity wounds likely secondary to venous stasis. Still has one on the upper right tibial area. The area on the left leg is healed however he still has an area on the left heel. This needed an extensive debridement today. His wife brings in notes apparently Dr. March Rummage ordered an MRI of the left heel. I was unaware of this. I was not aware that he did not been seen by Dr. March Rummage and I do not have any records of this. Apparently MRI called to set up the appointment. I will need to look through epic on this 10/15; this is a patient with known PAD who was revascularized. He also has venous stasis disease. The area that was on the right anterior tibia has actually close down although he has a still a small area on the upper anterior tibia. 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 We have been using Iodoflex on this area and he is required debridements. We tried to get her to agree to an MRI that have been ordered by Dr. March Rummage. However she did not get back to the MRI people or they have not called her back. We will reorder this. He is quite tender around this wound and I wonder about osteomyelitis. 10/29; MRI. Fortunately this did not show any evidence of abscess or osteomyelitis in the left heel. Vein and vascular on 10/21; this is a patient who is previously had an angiogram. He had a heavily calcified aorta and iliac vessels as well as the SFA. He had multiple areas of diffuse 50% stenosis. No interventions were apparently done by Dr. Donzetta Matters he does have an occluded right SFA but with above-knee reconstitution. He was not felt to be a candidate for revascularization for his diffuse areas of greater than 50% stenosis. It was not felt that these were amenable to intervention Since he was last here he went to see dermatology. I believe he had skin cancer extractions from the right area anteriorly and 2 on the left medial. 11/12; left heel actually looks somewhat better but still requiring debridement. We've been using Iodoflex Right anterior mid tibia surgical extraction for a skin cancer. This also required debridement The area on the left lower medial leg looks a lot better and is just about fully epithelialized 12/3; pressure ulcer on the left heel in the setting of PAD. This looks better still requiring debridement we have been using Iodoflex Right anterior mid tibia surgical extraction for cancer this also continually  requires debridement although the wound measures smaller The area on the left lower medial leg had much more eschar on this than last time this is a deterioration. Notable for the fact that home health only wrap the leg halfway and actually had one of the wraps at the level of the wound on the right anterior. 12/17; the  area on the left heel is just about closed. The area on the right anterior tibia looks much healthier and is smaller. The area on the left medial ankle I think is also smaller. We have been using Iodoflex to all areas under 3 layer compression bilaterally. He has home health 1/7; left heel is healed. He has an area on the right anterior tibia and the left medial lower leg. Significant chronic venous insufficiency he has home health 1/21; left heel remains healed which is his original wound. He is yet area on the right anterior tibia which is smaller and an area on the left medial lower leg just above the medial malleolus this also looks smaller. He has significant chronic venous insufficiency 2/4; left heel remains closed. He has an area on the right anterior and a left medial lower leg this is in the setting of chronic venous insufficiency. We have been using Hydrofera Blue and 3 layer compression bilateral 2/18; patient with chronic venous insufficiency wounds on the right anterior and left medial lower leg the left medial lower leg is just about closed we have excellent edema control. Original pressure ulcer on his left heel remains closed. He arrives in clinic today with a thick mycotic nail on the left great toe split in the middle. There was apparently quite a bit of bleeding the other day on a "white carpet" 03/01/2021 upon evaluation today patient appears to be doing well with regard to his leg ulcer all things considered. It is very dry but I do not think he is healed at this point. We ordered Wallie Char wraps has not heard anything back from that but he has been in the hospital since 26 February due to congestive heart failure. This might be part of why he missed that call. Nonetheless he still has an open wound on the right leg which I think is continue to be managed its dry right now but is not completely healed. Fortunately there does not appear to be any signs of active infection at this time.  No fevers, chills, nausea, vomiting, or diarrhea. 3/18; the patient came in today using compression stockings on the left leg however he has a small open area on the left medial lower leg just above the malleolus and I am going to put him back in compression today. Still the area on the right anterior mid tibia Electronic Signature(s) Signed: 03/12/2021 5:26:06 PM By: Linton Ham MD Entered By: Linton Ham on 03/12/2021 14:30:11 -------------------------------------------------------------------------------- Physical Exam Details Patient Name: Date of Service: Andrew [name], [name] 03/12/2021 12:30 PM Medical Record Number: [id number] Patient Account Number: [id number] Date of Birth/Sex: Treating RN: [birth year] (87 y.o. [name] Primary Care Provider: Dimas Chyle Other Clinician: Referring Provider: Treating Provider/Extender: Donalee Citrin in Treatment: 26 Constitutional Sitting or standing Blood Pressure is within target range for patient.. Pulse regular and within target range for patient.Marland Kitchen Respirations regular, non-labored and within target range.. Temperature is normal and within the target range for the patient.Marland Kitchen Appears in no distress. Cardiovascular Pedal pulses are palpable. Notes Wound exam; the patient has a small but punched-out area on the  right anterior mid tibia and a very superficial venous wound on the medial part of the left lower leg. His edema control does not look too bad however he has severe bilateral stasis dermatitis Electronic Signature(s) Signed: 03/12/2021 5:26:06 PM By: Linton Ham MD Entered By: Linton Ham on 03/12/2021 14:31:51 -------------------------------------------------------------------------------- Physician Orders Details Patient Name: Date of Service: Andrew [name], [name] 03/12/2021 12:30 PM Medical Record Number: [id number] Patient Account Number: [id number] Date of Birth/Sex: Treating  RN: 10/30/1928 (87 y.o. Marcheta Grammes Primary Care Provider: Dimas Chyle Other Clinician: Referring Provider: Treating Provider/Extender: Donalee Citrin in Treatment: 26 Verbal / Phone Orders: No Diagnosis Coding ICD-10 Coding Code Description E11.621 Type 2 diabetes mellitus with foot ulcer I87.333 Chronic venous hypertension (idiopathic) with ulcer and inflammation of bilateral lower extremity L97.811 Non-pressure chronic ulcer of other part of right lower leg limited to breakdown of skin L97.829 Non-pressure chronic ulcer of other part of left lower leg with unspecified severity T81.31XA Disruption of external operation (surgical) wound, not elsewhere classified, initial encounter L60.1 Onycholysis Follow-up Appointments Return Appointment in 1 week. Bathing/ Shower/ Hygiene May shower with protection but do not get wound dressing(s) wet. Edema Control - Lymphedema / SCD / Other Bilateral Lower Extremities Elevate legs to the level of the heart or above for 30 minutes daily and/or when sitting, a frequency of: - throughout the day Avoid standing for long periods of time. Exercise regularly Compression stocking or Garment 20-30 mm/Hg pressure to: - Farrow wrap to left leg daily when available, apply first thing in the morning, remove at night before bed Non Wound Condition pply the following to affected area as directed: - triple antibiotic ointment to left great toenail bed daily and cover with a [facility] wound care orders this week; continue Home Health for wound care. May utilize formulary equivalent dressing for wound treatment orders unless otherwise specified. - New wound left leg, 2 layer wrap. Other Home Health Orders/Instructions: - Medi Home Wound Treatment Wound #4 - Lower Leg Wound Laterality: Right, Anterior, Proximal Cleanser: Soap and Water (Home Health) 2 x Per Week/30 Days Discharge Instructions: May shower and wash  wound with dial antibacterial soap and water prior to dressing change. Cleanser: Normal Saline (Home Health) 2 x Per Week/30 Days Discharge Instructions: Cleanse the wound with Normal Saline prior or wound cleanser to applying a clean dressing using gauze sponges, not tissue or cotton balls. Peri-Wound Care: Sween Lotion (Moisturizing lotion) (Home Health) 2 x Per Week/30 Days Discharge Instructions: Apply moisturizing lotion or equivalent moisterizing lotion to legs under wraps Prim Dressing: Hydrofera Blue Classic Foam, 2x2 in (Home Health) 2 x Per Week/30 Days ary Discharge Instructions: Moisten with saline prior to applying to wound bed Secondary Dressing: Woven Gauze Sponge, Non-Sterile 4x4 in (Home Health) 2 x Per Week/30 Days Discharge Instructions: Apply over primary dressing as directed. Compression Wrap: Kerlix Roll 4.5x3.1 (in/yd) (Home Health) 2 x Per Week/30 Days Discharge Instructions: Apply Kerlix and Coban compression , wrap from base of toes to knee Compression Wrap: Coban Self-Adherent Wrap 4x5 (in/yd) (Home Health) 2 x Per Week/30 Days Discharge Instructions: Apply over Kerlix as directed. Wound #6 - Lower Leg Wound Laterality: Left, Medial Cleanser: Soap and Water (Home Health) 2 x Per Week/30 Days Discharge Instructions: May shower and wash wound with dial antibacterial soap and water prior to dressing change. Cleanser: Normal Saline (Home Health) 2 x Per Week/30 Days Discharge Instructions: Cleanse the wound with Normal Saline  prior or wound cleanser to applying a clean dressing using gauze sponges, not tissue or cotton balls. Peri-Wound Care: Sween Lotion (Moisturizing lotion) (Home Health) 2 x Per Week/30 Days Discharge Instructions: Apply moisturizing lotion or equivalent moisterizing lotion to legs under wraps Prim Dressing: Hydrofera Blue Classic Foam, 2x2 in (Home Health) 2 x Per Week/30 Days ary Discharge Instructions: Moisten with saline prior to applying to  wound bed Secondary Dressing: Woven Gauze Sponge, Non-Sterile 4x4 in (Home Health) 2 x Per Week/30 Days Discharge Instructions: Apply over primary dressing as directed. Compression Wrap: Kerlix Roll 4.5x3.1 (in/yd) (Home Health) 2 x Per Week/30 Days Discharge Instructions: Apply Kerlix and Coban compression , wrap from base of toes to knee Compression Wrap: Coban Self-Adherent Wrap 4x5 (in/yd) (Home Health) 2 x Per Week/30 Days Discharge Instructions: Apply over Kerlix as directed. Electronic Signature(s) Signed: 03/12/2021 5:26:06 PM By: Linton Ham MD Signed: 03/12/2021 5:48:33 PM By: Lorrin Jackson Previous Signature: 03/12/2021 12:51:02 PM Version By: Lorrin Jackson Entered By: Lorrin Jackson on 03/12/2021 13:55:56 -------------------------------------------------------------------------------- Problem List Details Patient Name: Date of Service: Andrew [name], [name] 03/12/2021 12:30 PM Medical Record Number: [id number] Patient Account Number: [id number] Date of Birth/Sex: Treating RN: 1928/05/25 (87 y.o. Marcheta Grammes Primary Care Provider: Dimas Chyle Other Clinician: Referring Provider: Treating Provider/Extender: Donalee Citrin in Treatment: 26 Active Problems ICD-10 Encounter Code Description Active Date MDM Diagnosis E11.621 Type 2 diabetes mellitus with foot ulcer 09/11/2020 No Yes I87.333 Chronic venous hypertension (idiopathic) with ulcer and inflammation of 09/11/2020 No Yes bilateral lower extremity L97.811 Non-pressure chronic ulcer of other part of right lower leg limited to breakdown 09/11/2020 No Yes of skin L97.829 Non-pressure chronic ulcer of other part of left lower leg with unspecified 10/23/2020 No Yes severity T81.31XA Disruption of external operation (surgical) wound, not elsewhere classified, 10/23/2020 No Yes initial encounter L60.1 Onycholysis 02/12/2021 No Yes Inactive Problems ICD-10 Code Description Active Date Inactive  Date L97.422 Non-pressure chronic ulcer of left heel and midfoot with fat layer exposed 09/11/2020 09/11/2020 L97.821 Non-pressure chronic ulcer of other part of left lower leg limited to breakdown of skin 09/11/2020 09/11/2020 L97.818 Non-pressure chronic ulcer of other part of right lower leg with other specified severity 10/23/2020 10/23/2020 Resolved Problems Electronic Signature(s) Signed: 03/12/2021 5:26:06 PM By: Linton Ham MD Previous Signature: 03/12/2021 12:50:36 PM Version By: Lorrin Jackson Entered By: Linton Ham on 03/12/2021 14:27:36 -------------------------------------------------------------------------------- Progress Note Details Patient Name: Date of Service: Andrew [name], [name] 03/12/2021 12:30 PM Medical Record Number: [id number] Patient Account Number: [id number] Date of Birth/Sex: Treating RN: [birth year] (87 y.o. [name] Primary Care Provider: Dimas Chyle Other Clinician: Referring Provider: Treating Provider/Extender: Donalee Citrin in Treatment: 26 Subjective History of Present Illness (HPI) 09/11/2020 ADMISSION This is a soon-to-be 87 year old man who was hospitalized in June and subsequently spent time at [facility]. At some point he developed a pressure ulcer on his left heel. On discharge he was seen by podiatry and referred to Dr. Donzetta Matters with regards to the possibility of PAD. The patient underwent angiography on 08/10/2020 this showed heavily calcified vessels. The common iliac artery on the left appeared to have calcific disease but there was no gradient. Left common external leg arteries were heavily tortuous left lower extremity limited angiography developed demonstrated a SFA to be helped heavily calcified throughout with multiple greater than 50% stenosis but never frankly occludes he was not felt to be a candidate for endovascular repair. I think they  had a minimal look at the right side and it  appeared that the SFA was occluded and reconstitutes above the knee. The overall feeling was that if he could not heal his left heel ulcer he will eventually best be served by a left above-knee amputation The last I can see is that he should have been using a Santyl wet-to-dry dressing to the heel. During his stay with podiatry was also noted that he had venous insufficiency and I think venous insufficiency wounds. His wife tells me that he has been recommended for compression stockings in the past but the patient refused, he has wounds on his bilateral anterior lower legs right greater than the left. Chronic venous inflammation and dry flaking skin. Past medical history; heart failure, right heel wound, paroxysmal atrial fibrillation on Eliquis, abdominal aortic aneurysm with previous repair, COPD, DVT, melanoma on his back, diabetes with a recent hemoglobin A1c of 7.3 however he is not on any treatment [type II], PAD, coronary artery disease, hypertension The patient's most recent arterial studies were on 05/28/2020. This showed an ABI on the right at 1.12 but monophasic waveforms and absent waveforms at the posterior tibial artery. On the left his ABI was 1.47 [noncompressible] he had triphasic waveforms at the ATA and monophasic at the PTA. His TBI on the right was 1.12 on the left and 0.97. It was felt his ABIs were falsely elevated 9/24. The patient followed up with Dr. Donzetta Matters and had noninvasive arterial studies. On the right his ABIs were noncompressible monophasic waveforms with a TBI of 1.45 and a right great toe pressure of 172. On the left he had monophasic waveforms with noncompressible ABIs TBI of 0.79 and a left great toe pressure of 94. He will be seen in follow-up by Dr. Donzetta Matters in a month's time. The patient also has very significant chronic venous insufficiency 10/8; patient with known PAD. He came in with bilateral lower extremity wounds likely secondary to venous stasis. Still has one on  the upper right tibial area. The area on the left leg is healed however he still has an area on the left heel. This needed an extensive debridement today. His wife brings in notes apparently Dr. March Rummage ordered an MRI of the left heel. I was unaware of this. I was not aware that he did not been seen by Dr. March Rummage and I do not have any records of this. Apparently MRI called to set up the appointment. I will need to look through epic on this 10/15; this is a patient with known PAD who was revascularized. He also has venous stasis disease. The area that was on the right anterior tibia has actually close down although he has a still a small area on the upper anterior tibia. 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 We have been using Iodoflex on this area and he is required debridements. We tried to get her to agree to an MRI that have been ordered by Dr. March Rummage. However she did not get back to the MRI people or they have not called her back. We will reorder this. He is quite tender around this wound and I wonder about osteomyelitis. 10/29; MRI. Fortunately this did not show any evidence of abscess or osteomyelitis in the left heel. Vein and vascular on 10/21; this is  a patient who is previously had an angiogram. He had a heavily calcified aorta and iliac vessels as well as the SFA. He had multiple areas of diffuse 50% stenosis. No interventions were apparently done by Dr. Donzetta Matters he does have an occluded right SFA but with above-knee reconstitution. He was not felt to be a candidate for revascularization for his diffuse areas of greater than 50% stenosis. It was not felt that these were amenable to intervention Since he was last here he went to see dermatology. I believe he had skin cancer extractions from the right area anteriorly and  2 on the left medial. 11/12; left heel actually looks somewhat better but still requiring debridement. We've been using Iodoflex ooRight anterior mid tibia surgical extraction for a skin cancer. This also required debridement ooThe area on the left lower medial leg looks a lot better and is just about fully epithelialized 12/3; pressure ulcer on the left heel in the setting of PAD. This looks better still requiring debridement we have been using Iodoflex ooRight anterior mid tibia surgical extraction for cancer this also continually requires debridement although the wound measures smaller ooThe area on the left lower medial leg had much more eschar on this than last time this is a deterioration. ooNotable for the fact that home health only wrap the leg halfway and actually had one of the wraps at the level of the wound on the right anterior. 12/17; the area on the left heel is just about closed. The area on the right anterior tibia looks much healthier and is smaller. The area on the left medial ankle I think is also smaller. We have been using Iodoflex to all areas under 3 layer compression bilaterally. He has home health 1/7; left heel is healed. He has an area on the right anterior tibia and the left medial lower leg. Significant chronic venous insufficiency he has home health 1/21; left heel remains healed which is his original wound. He is yet area on the right anterior tibia which is smaller and an area on the left medial lower leg just above the medial malleolus this also looks smaller. He has significant chronic venous insufficiency 2/4; left heel remains closed. He has an area on the right anterior and a left medial lower leg this is in the setting of chronic venous insufficiency. We have been using Hydrofera Blue and 3 layer compression bilateral 2/18; patient with chronic venous insufficiency wounds on the right anterior and left medial lower leg the left medial lower leg is just about  closed we have excellent edema control. Original pressure ulcer on his left heel remains closed. He arrives in clinic today with a thick mycotic nail on the left great toe split in the middle. There was apparently quite a bit of bleeding the other day on a "white carpet" 03/01/2021 upon evaluation today patient appears to be doing well with regard to his leg ulcer all things considered. It is very dry but I do not think he is healed at this point. We ordered Wallie Char wraps has not heard anything back from that but he has been in the hospital since 26 February due to congestive heart failure. This might be part of why he missed that call. Nonetheless he still has an open wound on the right leg which I think is continue to be managed its dry right now but is not completely healed. Fortunately there does not appear to be any signs of active infection at this time. No fevers,  chills, nausea, vomiting, or diarrhea. 3/18; the patient came in today using compression stockings on the left leg however he has a small open area on the left medial lower leg just above the malleolus and I am going to put him back in compression today. Still the area on the right anterior mid tibia Objective Constitutional Sitting or standing Blood Pressure is within target range for patient.. Pulse regular and within target range for patient.Marland Kitchen Respirations regular, non-labored and within target range.. Temperature is normal and within the target range for the patient.Marland Kitchen Appears in no distress. Vitals Time Taken: 12:55 PM, Height: 67 in, Weight: 185 lbs, BMI: 29, Temperature: 97.6 F, Pulse: 60 bpm, Respiratory Rate: 18 breaths/min, Blood Pressure: 102/53 mmHg. Cardiovascular Pedal pulses are palpable. General Notes: Wound exam; the patient has a small but punched-out area on the right anterior mid tibia and a very superficial venous wound on the medial part of the left lower leg. His edema control does not look too bad however he  has severe bilateral stasis dermatitis Integumentary (Hair, Skin) Wound #4 status is Open. Original cause of wound was Gradually Appeared. The date acquired was: 10/09/2020. The wound has been in treatment 22 weeks. The wound is located on the Right,Proximal,Anterior Lower Leg. The wound measures 0.2cm length x 0.2cm width x 0.1cm depth; 0.031cm^2 area and 0.003cm^3 volume. There is Fat Layer (Subcutaneous Tissue) exposed. There is no tunneling or undermining noted. There is a small amount of serosanguineous drainage noted. The wound margin is flat and intact. There is large (67-100%) red granulation within the wound bed. There is no necrotic tissue within the wound bed. Wound #6 status is Open. Original cause of wound was Other Lesion. The date acquired was: 03/12/2021. The wound is located on the Left,Medial Lower Leg. The wound measures 0.5cm length x 1cm width x 0.1cm depth; 0.393cm^2 area and 0.039cm^3 volume. There is Fat Layer (Subcutaneous Tissue) exposed. There is no tunneling or undermining noted. There is a medium amount of sanguinous drainage noted. The wound margin is distinct with the outline attached to the wound base. There is large (67-100%) red granulation within the wound bed. There is no necrotic tissue within the wound bed. Assessment Active Problems ICD-10 Type 2 diabetes mellitus with foot ulcer Chronic venous hypertension (idiopathic) with ulcer and inflammation of bilateral lower extremity Non-pressure chronic ulcer of other part of right lower leg limited to breakdown of skin Non-pressure chronic ulcer of other part of left lower leg with unspecified severity Disruption of external operation (surgical) wound, not elsewhere classified, initial encounter Onycholysis Procedures Wound #6 Pre-procedure diagnosis of Wound #6 is a Venous Leg Ulcer located on the Left,Medial Lower Leg .Severity of Tissue Pre Debridement is: Fat layer exposed. There was a Selective/Open Wound  Skin/Dermis Debridement with a total area of 0.5 sq cm performed by Ricard Dillon., MD. With the following instrument(s): Curette to remove Non-Viable tissue/material. Material removed includes Skin: Dermis and Fibrin/Exudate and. No specimens were taken. A time out was conducted at 13:50, prior to the start of the procedure. A Minimum amount of bleeding was controlled with Pressure. The procedure was tolerated well. Post Debridement Measurements: 0.5cm length x 1cm width x 0.1cm depth; 0.039cm^3 volume. Character of Wound/Ulcer Post Debridement is stable. Severity of Tissue Post Debridement is: Fat layer exposed. Post procedure Diagnosis Wound #6: Same as Pre-Procedure Plan Follow-up Appointments: Return Appointment in 1 week. Bathing/ Shower/ Hygiene: May shower with protection but do not get wound dressing(s) wet. Edema  Control - Lymphedema / SCD / Other: Elevate legs to the level of the heart or above for 30 minutes daily and/or when sitting, a frequency of: - throughout the day Avoid standing for long periods of time. Exercise regularly Compression stocking or Garment 20-30 mm/Hg pressure to: - Farrow wrap to left leg daily when available, apply first thing in the morning, remove at night before bed Non Wound Condition: Apply the following to affected area as directed: - triple antibiotic ointment to left great toenail bed daily and cover with a bandaid Home Health: New wound care orders this week; continue Home Health for wound care. May utilize formulary equivalent dressing for wound treatment orders unless otherwise specified. - New wound left leg, 2 layer wrap. Other Home Health Orders/Instructions: - Medi Home WOUND #4: - Lower Leg Wound Laterality: Right, Anterior, Proximal Cleanser: Soap and Water (Home Health) 2 x Per Week/30 Days Discharge Instructions: May shower and wash wound with dial antibacterial soap and water prior to dressing change. Cleanser: Normal Saline (Home  Health) 2 x Per Week/30 Days Discharge Instructions: Cleanse the wound with Normal Saline prior or wound cleanser to applying a clean dressing using gauze sponges, not tissue or cotton balls. Peri-Wound Care: Sween Lotion (Moisturizing lotion) (Home Health) 2 x Per Week/30 Days Discharge Instructions: Apply moisturizing lotion or equivalent moisterizing lotion to legs under wraps Prim Dressing: Hydrofera Blue Classic Foam, 2x2 in (Home Health) 2 x Per Week/30 Days ary Discharge Instructions: Moisten with saline prior to applying to wound bed Secondary Dressing: Woven Gauze Sponge, Non-Sterile 4x4 in (Home Health) 2 x Per Week/30 Days Discharge Instructions: Apply over primary dressing as directed. Com pression Wrap: Kerlix Roll 4.5x3.1 (in/yd) (Home Health) 2 x Per Week/30 Days Discharge Instructions: Apply Kerlix and Coban compression , wrap from base of toes to knee Com pression Wrap: Coban Self-Adherent Wrap 4x5 (in/yd) (Home Health) 2 x Per Week/30 Days Discharge Instructions: Apply over Kerlix as directed. WOUND #6: - Lower Leg Wound Laterality: Left, Medial Cleanser: Soap and Water (Home Health) 2 x Per Week/30 Days Discharge Instructions: May shower and wash wound with dial antibacterial soap and water prior to dressing change. Cleanser: Normal Saline (Home Health) 2 x Per Week/30 Days Discharge Instructions: Cleanse the wound with Normal Saline prior or wound cleanser to applying a clean dressing using gauze sponges, not tissue or cotton balls. Peri-Wound Care: Sween Lotion (Moisturizing lotion) (Home Health) 2 x Per Week/30 Days Discharge Instructions: Apply moisturizing lotion or equivalent moisterizing lotion to legs under wraps Prim Dressing: Hydrofera Blue Classic Foam, 2x2 in (Home Health) 2 x Per Week/30 Days ary Discharge Instructions: Moisten with saline prior to applying to wound bed Secondary Dressing: Woven Gauze Sponge, Non-Sterile 4x4 in (Home Health) 2 x Per Week/30  Days Discharge Instructions: Apply over primary dressing as directed. Com pression Wrap: Kerlix Roll 4.5x3.1 (in/yd) (Home Health) 2 x Per Week/30 Days Discharge Instructions: Apply Kerlix and Coban compression , wrap from base of toes to knee Com pression Wrap: Coban Self-Adherent Wrap 4x5 (in/yd) (Home Health) 2 x Per Week/30 Days Discharge Instructions: Apply over Kerlix as directed. 1. We put Hydrofera Blue back on both wound areas 2. Kerlix Coban bilaterally versus just the right side. 3. His wife declined the Farrow wrap stockings we ordered because of cost although I think she can afford it. I went over this with her again. The reason for this is she cannot get on standard over the toe stockings. They have [facility] Medicare  product and they do not cover any form of compression Electronic Signature(s) Signed: 03/12/2021 5:26:06 PM By: Linton Ham MD Entered By: Linton Ham on 03/12/2021 14:33:56 -------------------------------------------------------------------------------- SuperBill Details Patient Name: Date of Service: Andrew [name], [name] 03/12/2021 Medical Record Number: [id number] Patient Account Number: [id number] Date of Birth/Sex: Treating RN: 1928/10/04 (87 y.o. Marcheta Grammes Primary Care Provider: Dimas Chyle Other Clinician: Referring Provider: Treating Provider/Extender: Donalee Citrin in Treatment: 26 Diagnosis Coding ICD-10 Codes Code Description E11.621 Type 2 diabetes mellitus with foot ulcer I87.333 Chronic venous hypertension (idiopathic) with ulcer and inflammation of bilateral lower extremity L97.811 Non-pressure chronic ulcer of other part of right lower leg limited to breakdown of skin L97.829 Non-pressure chronic ulcer of other part of left lower leg with unspecified severity T81.31XA Disruption of external operation (surgical) wound, not elsewhere classified, initial encounter L60.1 Onycholysis Facility Procedures CPT4  Code: 29528413 Description: [id number] - DEBRIDE WOUND 1ST 20 SQ CM OR < ICD-10 Diagnosis Description L97.829 Non-pressure chronic ulcer of other part of left lower leg with unspecified sever Modifier: ity Quantity: 1 Physician Procedures : CPT4 Code Description Modifier [id number] - WC PHYS DEBR WO ANESTH 20 SQ CM ICD-10 Diagnosis Description L97.829 Non-pressure chronic ulcer of other part of left lower leg with unspecified severity Quantity: 1 Electronic Signature(s) Signed: 03/12/2021 5:26:06 PM By: Linton Ham MD Entered By: Linton Ham on 03/12/2021 14:34:08

## 2021-03-15 DIAGNOSIS — I255 Ischemic cardiomyopathy: Secondary | ICD-10-CM | POA: Diagnosis not present

## 2021-03-15 DIAGNOSIS — I130 Hypertensive heart and chronic kidney disease with heart failure and stage 1 through stage 4 chronic kidney disease, or unspecified chronic kidney disease: Secondary | ICD-10-CM | POA: Diagnosis not present

## 2021-03-15 DIAGNOSIS — I872 Venous insufficiency (chronic) (peripheral): Secondary | ICD-10-CM | POA: Diagnosis not present

## 2021-03-15 DIAGNOSIS — I480 Paroxysmal atrial fibrillation: Secondary | ICD-10-CM | POA: Diagnosis not present

## 2021-03-15 DIAGNOSIS — J449 Chronic obstructive pulmonary disease, unspecified: Secondary | ICD-10-CM | POA: Diagnosis not present

## 2021-03-15 DIAGNOSIS — E1151 Type 2 diabetes mellitus with diabetic peripheral angiopathy without gangrene: Secondary | ICD-10-CM | POA: Diagnosis not present

## 2021-03-15 DIAGNOSIS — N184 Chronic kidney disease, stage 4 (severe): Secondary | ICD-10-CM | POA: Diagnosis not present

## 2021-03-15 DIAGNOSIS — S81801D Unspecified open wound, right lower leg, subsequent encounter: Secondary | ICD-10-CM | POA: Diagnosis not present

## 2021-03-15 DIAGNOSIS — E1122 Type 2 diabetes mellitus with diabetic chronic kidney disease: Secondary | ICD-10-CM | POA: Diagnosis not present

## 2021-03-16 ENCOUNTER — Other Ambulatory Visit: Payer: Self-pay

## 2021-03-16 ENCOUNTER — Ambulatory Visit (HOSPITAL_COMMUNITY)
Admission: RE | Admit: 2021-03-16 | Discharge: 2021-03-16 | Disposition: A | Discharge status: Home / Self Care | Payer: Medicare PPO | Source: Ambulatory Visit | Attending: Cardiology | Admitting: Cardiology

## 2021-03-16 ENCOUNTER — Other Ambulatory Visit (HOSPITAL_COMMUNITY): Payer: Self-pay | Admitting: Cardiology

## 2021-03-16 DIAGNOSIS — R001 Bradycardia, unspecified: Secondary | ICD-10-CM

## 2021-03-16 DIAGNOSIS — J9602 Acute respiratory failure with hypercapnia: Secondary | ICD-10-CM | POA: Diagnosis not present

## 2021-03-16 DIAGNOSIS — I5022 Chronic systolic (congestive) heart failure: Secondary | ICD-10-CM | POA: Diagnosis not present

## 2021-03-16 DIAGNOSIS — J449 Chronic obstructive pulmonary disease, unspecified: Secondary | ICD-10-CM | POA: Diagnosis not present

## 2021-03-16 DIAGNOSIS — J9601 Acute respiratory failure with hypoxia: Secondary | ICD-10-CM | POA: Diagnosis not present

## 2021-03-16 NOTE — Progress Notes (Signed)
Pt seen for 1 week ekg for bradycardia. Per Dr.McLean ekg ok but stop carvedilol and wear 7 day zio patch. Medication discontinued and zio patch placed.  Katie with paramedicine aware.

## 2021-03-17 ENCOUNTER — Other Ambulatory Visit (HOSPITAL_COMMUNITY): Payer: Self-pay

## 2021-03-17 ENCOUNTER — Other Ambulatory Visit (HOSPITAL_COMMUNITY): Payer: Self-pay | Admitting: Cardiology

## 2021-03-17 ENCOUNTER — Other Ambulatory Visit: Payer: Self-pay | Admitting: Family Medicine

## 2021-03-17 DIAGNOSIS — I872 Venous insufficiency (chronic) (peripheral): Secondary | ICD-10-CM | POA: Diagnosis not present

## 2021-03-17 DIAGNOSIS — I255 Ischemic cardiomyopathy: Secondary | ICD-10-CM | POA: Diagnosis not present

## 2021-03-17 DIAGNOSIS — I480 Paroxysmal atrial fibrillation: Secondary | ICD-10-CM | POA: Diagnosis not present

## 2021-03-17 DIAGNOSIS — E1151 Type 2 diabetes mellitus with diabetic peripheral angiopathy without gangrene: Secondary | ICD-10-CM | POA: Diagnosis not present

## 2021-03-17 DIAGNOSIS — I130 Hypertensive heart and chronic kidney disease with heart failure and stage 1 through stage 4 chronic kidney disease, or unspecified chronic kidney disease: Secondary | ICD-10-CM | POA: Diagnosis not present

## 2021-03-17 DIAGNOSIS — S81801D Unspecified open wound, right lower leg, subsequent encounter: Secondary | ICD-10-CM | POA: Diagnosis not present

## 2021-03-17 DIAGNOSIS — E1122 Type 2 diabetes mellitus with diabetic chronic kidney disease: Secondary | ICD-10-CM | POA: Diagnosis not present

## 2021-03-17 DIAGNOSIS — J449 Chronic obstructive pulmonary disease, unspecified: Secondary | ICD-10-CM | POA: Diagnosis not present

## 2021-03-17 DIAGNOSIS — N184 Chronic kidney disease, stage 4 (severe): Secondary | ICD-10-CM | POA: Diagnosis not present

## 2021-03-17 NOTE — Progress Notes (Signed)
Came out today for med rec post clinic f/u yesterday. Carvedilol was stopped.   Called in those refills. Will have to come back out tomor once the pharmacy delivers it to place where needed--he will be missing his hydralazine in a few days of pill box.  Wife will call me tomor once the pharmacy brings out his meds.   Marylouise Stacks, EMT-Paramedic  03/17/21

## 2021-03-18 ENCOUNTER — Other Ambulatory Visit (HOSPITAL_COMMUNITY): Payer: Self-pay

## 2021-03-18 DIAGNOSIS — N184 Chronic kidney disease, stage 4 (severe): Secondary | ICD-10-CM | POA: Diagnosis not present

## 2021-03-18 DIAGNOSIS — I872 Venous insufficiency (chronic) (peripheral): Secondary | ICD-10-CM | POA: Diagnosis not present

## 2021-03-18 DIAGNOSIS — I255 Ischemic cardiomyopathy: Secondary | ICD-10-CM | POA: Diagnosis not present

## 2021-03-18 DIAGNOSIS — E1122 Type 2 diabetes mellitus with diabetic chronic kidney disease: Secondary | ICD-10-CM | POA: Diagnosis not present

## 2021-03-18 DIAGNOSIS — I480 Paroxysmal atrial fibrillation: Secondary | ICD-10-CM | POA: Diagnosis not present

## 2021-03-18 DIAGNOSIS — S81801D Unspecified open wound, right lower leg, subsequent encounter: Secondary | ICD-10-CM | POA: Diagnosis not present

## 2021-03-18 DIAGNOSIS — E1151 Type 2 diabetes mellitus with diabetic peripheral angiopathy without gangrene: Secondary | ICD-10-CM | POA: Diagnosis not present

## 2021-03-18 DIAGNOSIS — I130 Hypertensive heart and chronic kidney disease with heart failure and stage 1 through stage 4 chronic kidney disease, or unspecified chronic kidney disease: Secondary | ICD-10-CM | POA: Diagnosis not present

## 2021-03-18 DIAGNOSIS — J449 Chronic obstructive pulmonary disease, unspecified: Secondary | ICD-10-CM | POA: Diagnosis not present

## 2021-03-18 NOTE — Progress Notes (Signed)
Came out today for med rec post pharmacy delivery today.  Filled pill box up, went ahead and filled 2 pill boxes, the 2nd one he needs gabapentin and fluoxetine. Those were called in but it needed a doctor auth for continued refills. Wife will call me next week when they deliver it.  Wife made him PCP appointment for his rt sided abd pain but he refused to go and told her to cancel it, so she did.   Will f/u next week.   Marylouise Stacks, EMT-Paramedic 03/18/21

## 2021-03-19 ENCOUNTER — Encounter (HOSPITAL_BASED_OUTPATIENT_CLINIC_OR_DEPARTMENT_OTHER): Payer: Medicare PPO | Admitting: Internal Medicine

## 2021-03-22 ENCOUNTER — Telehealth: Payer: Self-pay

## 2021-03-22 NOTE — Telephone Encounter (Signed)
Physical Therapist from [facility] called requesting verbal orders for home health physical therapy. Requesting pt to be seen 1 x 4 weeks. Please advise.

## 2021-03-22 NOTE — Telephone Encounter (Signed)
Please advise 

## 2021-03-22 NOTE — Telephone Encounter (Signed)
VO given.

## 2021-03-22 NOTE — Telephone Encounter (Signed)
Ok with me. Please place any necessary orders. 

## 2021-03-23 DIAGNOSIS — I5043 Acute on chronic combined systolic (congestive) and diastolic (congestive) heart failure: Secondary | ICD-10-CM | POA: Diagnosis not present

## 2021-03-23 DIAGNOSIS — I130 Hypertensive heart and chronic kidney disease with heart failure and stage 1 through stage 4 chronic kidney disease, or unspecified chronic kidney disease: Secondary | ICD-10-CM | POA: Diagnosis not present

## 2021-03-23 DIAGNOSIS — I480 Paroxysmal atrial fibrillation: Secondary | ICD-10-CM | POA: Diagnosis not present

## 2021-03-23 DIAGNOSIS — D631 Anemia in chronic kidney disease: Secondary | ICD-10-CM | POA: Diagnosis not present

## 2021-03-23 DIAGNOSIS — N1832 Chronic kidney disease, stage 3b: Secondary | ICD-10-CM | POA: Diagnosis not present

## 2021-03-23 DIAGNOSIS — S81801D Unspecified open wound, right lower leg, subsequent encounter: Secondary | ICD-10-CM | POA: Diagnosis not present

## 2021-03-23 DIAGNOSIS — J449 Chronic obstructive pulmonary disease, unspecified: Secondary | ICD-10-CM | POA: Diagnosis not present

## 2021-03-23 DIAGNOSIS — E1122 Type 2 diabetes mellitus with diabetic chronic kidney disease: Secondary | ICD-10-CM | POA: Diagnosis not present

## 2021-03-23 DIAGNOSIS — I2510 Atherosclerotic heart disease of native coronary artery without angina pectoris: Secondary | ICD-10-CM | POA: Diagnosis not present

## 2021-03-24 ENCOUNTER — Other Ambulatory Visit (HOSPITAL_COMMUNITY): Payer: Self-pay

## 2021-03-24 DIAGNOSIS — R42 Dizziness and giddiness: Secondary | ICD-10-CM | POA: Diagnosis not present

## 2021-03-24 DIAGNOSIS — R0902 Hypoxemia: Secondary | ICD-10-CM | POA: Diagnosis not present

## 2021-03-24 DIAGNOSIS — S0990XA Unspecified injury of head, initial encounter: Secondary | ICD-10-CM | POA: Diagnosis not present

## 2021-03-24 DIAGNOSIS — R000 Tachycardia, unspecified: Secondary | ICD-10-CM | POA: Diagnosis not present

## 2021-03-24 DIAGNOSIS — W19XXXA Unspecified fall, initial encounter: Secondary | ICD-10-CM | POA: Diagnosis not present

## 2021-03-24 NOTE — Progress Notes (Signed)
Paramedicine Encounter    Patient ID: Andrew [name], male    DOB: [birth year], 87 y.o.   MRN: [id number]   Patient Care Team: [name], MD as PCP - General (Family Medicine) Larey Dresser, MD as PCP - Advanced Heart Failure (Cardiology) Larey Dresser, MD as Consulting Physician (Cardiology) Jorge Ny, LCSW as Social Worker (Licensed Clinical Social Worker) Gardiner Barefoot, DPM as Consulting Physician (Podiatry) [name], MD (Family Medicine) Patient, No Pcp Per (Inactive) (General Practice)  Patient Active Problem List   Diagnosis Date Noted  . Acute respiratory failure with hypoxia ([place]) 02/20/2021  . COPD (chronic obstructive pulmonary disease) ([place]) 02/20/2021  . History of CVA (cerebrovascular accident)   . Acute respiratory failure with hypercapnia ([place]) 06/02/2020  . Acute metabolic encephalopathy 44/02/4741  . CKD (chronic kidney disease), stage IV ([place]) 06/02/2020  . PAF (paroxysmal atrial fibrillation) ([place]) 06/02/2020  . Hypothyroidism 06/02/2020  . Chronic anticoagulation 06/02/2020  . Debility 06/01/2020  . Lumbar spondylosis 10/04/2018  . Chronic respiratory failure with hypoxia ([place]) 09/16/2018  . Pain of right thumb 06/19/2018  . Bilateral recurrent inguinal hernias 11/28/2017  . Carotid artery stenosis 01/12/2016  . Chronic systolic CHF (congestive heart failure) ([place]) 12/02/2015  . Acute on chronic combined systolic and diastolic CHF (congestive heart failure) ([place]) 11/11/2015  . 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 PVD (peripheral vascular disease) ([place])   . Hereditary and idiopathic peripheral neuropathy 10/02/2015  . Peripelvic (lymphatic) cyst   . Pernicious anemia 07/15/2015  . Bilateral renal cysts 07/12/2015  . Lung nodule, 53mm RML CT 07/12/15 07/12/2015  . Chronic kidney disease, stage 3b ([place]) 06/14/2015  . Paroxysmal atrial fibrillation ([place]) 05/28/2015  . AAA (abdominal aortic aneurysm)  without rupture ([place]) 09/30/2014  . Cardiomyopathy, ischemic 05/23/2014  . DM (diabetes mellitus), type 2 with renal complications ([place]) 59/56/3875  . CAD (coronary artery disease) 11/09/2010  . Depression, major, single episode, complete remission ([place]) 03/18/2010  . Hypothyroidism 02/03/2009  . Osteoarthritis 05/27/2008  . Hyperlipidemia associated with type 2 diabetes mellitus ([place]) 06/14/2007  . Hypertension associated with diabetes ([place]) 06/14/2007  . GERD 06/14/2007    Current Outpatient Medications:  .  acetaminophen (TYLENOL) 500 MG tablet, Take 2 tablets (1,000 mg total) by mouth every 8 (eight) hours as needed., Disp: 30 tablet, Rfl: 0 .  amiodarone (PACERONE) 200 MG tablet, Take 0.5 tablets (100 mg total) by mouth daily., Disp: 45 tablet, Rfl: 3 .  atorvastatin (LIPITOR) 80 MG tablet, Take 1 tablet (80 mg total) by mouth daily. (Patient taking differently: Take 80 mg by mouth every evening.), Disp: 30 tablet, Rfl: 3 .  ELIQUIS 2.5 MG TABS tablet, TAKE 1 TABLET BY MOUTH TWICE DAILY., Disp: 180 tablet, Rfl: 1 .  empagliflozin (JARDIANCE) 10 MG TABS tablet, Take 1 tablet (10 mg total) by mouth daily before breakfast., Disp: 90 tablet, Rfl: 3 .  ezetimibe (ZETIA) 10 MG tablet, TAKE (1) TABLET DAILY., Disp: 90 tablet, Rfl: 3 .  FLUoxetine (PROZAC) 20 MG capsule, TAKE (1) CAPSULE DAILY., Disp: 90 capsule, Rfl: 0 .  gabapentin (NEURONTIN) 100 MG capsule, Take 100 mg by mouth 3 (three) times daily., Disp: , Rfl:  .  hydrALAZINE (APRESOLINE) 50 MG tablet, Take 1 tablet (50 mg total) by mouth 3 (three) times daily., Disp: 90 tablet, Rfl: 3 .  isosorbide mononitrate (IMDUR) 30 MG 24 hr tablet, Take 1 tablet (30 mg total) by mouth daily., Disp: 30 tablet,  Rfl: 3 .  levothyroxine (SYNTHROID) 100 MCG tablet, TAKE 1 TABLET BEFORE BREAKFAST. (Patient taking differently: Take 100 mcg by mouth daily before breakfast.), Disp: 90 tablet, Rfl: 0 .  metolazone (ZAROXOLYN) 2.5 MG tablet, Take 1 tablet  (2.5 mg total) by mouth as directed. By HF Clinic (Patient not taking: No sig reported), Disp: 3 tablet, Rfl: 0 .  Multiple Vitamins-Minerals (MULTIVITAMIN ADULTS 50+) TABS, Take 1 tablet by mouth daily in the afternoon., Disp: , Rfl:  .  nitroGLYCERIN (NITROSTAT) 0.4 MG SL tablet, DISSOLVE 1 TABLET UNDER TONGUE AS NEEDED FOR CHEST PAIN,MAY REPEAT IN5 MINUTES FOR 2 DOSES. (Patient not taking: No sig reported), Disp: 25 tablet, Rfl: 3 .  pantoprazole (PROTONIX) 40 MG tablet, Take 1 tablet (40 mg total) by mouth 2 (two) times daily., Disp: , Rfl:  .  potassium chloride SA (KLOR-CON) 20 MEQ tablet, TAKE 1 TABLET ONCE DAILY., Disp: 90 tablet, Rfl: 3 .  tiotropium (SPIRIVA) 18 MCG inhalation capsule, Place 18 mcg into inhaler and inhale daily as needed (for wheezing)., Disp: , Rfl:  .  torsemide (DEMADEX) 20 MG tablet, Take 4 tablets (80 mg total) by mouth every morning AND 2 tablets (40 mg total) every evening. (Patient taking differently: Take 4 tablets (80 mg total) by mouth every morning as of 3/15.), Disp: 180 tablet, Rfl: 11 Allergies  Allergen Reactions  . Metoprolol Shortness Of Breath, Palpitations and Other (See Comments)    Heart starts racing. Shallow breathing; pt states he also get skin irritation on his legs  . Metformin Hives and Swelling    On legs  . Metformin And Related Nausea And Vomiting      Social History   Socioeconomic History  . Marital status: Married    Spouse name: Not on file  . Number of children: Not on file  . Years of education: Not on file  . Highest education level: Not on file  Occupational History  . Occupation: Retired  Tobacco Use  . Smoking status: Never Smoker  . Smokeless tobacco: Never Used  . Tobacco comment: quit atleast 25 yrs ago, per pt  Vaping Use  . Vaping Use: Never used  Substance and Sexual Activity  . Alcohol use: Never    Comment: socially  . Drug use: Never  . Sexual activity: Not Currently  Other Topics Concern  . Not on  file  Social History Narrative   ** Merged History Encounter **       Pt lives in [place] with spouse. Retired from [employer].  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   Social Determinants of Health   Financial Resource Strain: Low Risk   . Difficulty of Paying Living Expenses: Not hard at all  Food Insecurity: No Food Insecurity  . Worried About Charity fundraiser in the Last Year: Never true  . Ran Out of Food in the Last Year: Never true  Transportation Needs: No Transportation Needs  . Lack of Transportation (Medical): No  . Lack of Transportation (Non-Medical): No  Physical Activity: Inactive  . Days of Exercise per Week: 0 days  . Minutes of Exercise per Session: 0 min  Stress: No Stress Concern Present  . Feeling of Stress : Not at all  Social Connections: Moderately Isolated  . Frequency of Communication with Friends and Family: More than three times a week  . Frequency of Social Gatherings with Friends and Family: More than three times a week  . Attends  Religious Services: Never  . Active Member of Clubs or Organizations: No  . Attends Archivist Meetings: Never  . Marital Status: Married  Human resources officer Violence: Not At Risk  . Fear of Current or Ex-Partner: No  . Emotionally Abused: No  . Physically Abused: No  . Sexually Abused: No    Physical Exam      Future Appointments  Date Time Provider [place]  04/19/2021  2:00 PM Larey Dresser, MD [facility] None    BP 130/74   Pulse (!) 52   Resp 18   SpO2 94%   Pt wife called me this morning reporting he had a fall at 5am today and EMS came to assist. He did hit his head. Not sure what caused him to fall. He doesn't think he had LOC, when EMS he was CAO per his norm. He does have a knot on his head with a skin tear. I bandaged it up again. He was adamant about not going to hosp and still is.  Advised wife that she can put an ice pack on  it to decrease the swelling. He stated he was ok and he was 87 y/o and is not going to hosp again.   Marylouise Stacks, [facility] Paramedic  03/24/21

## 2021-03-25 DIAGNOSIS — D631 Anemia in chronic kidney disease: Secondary | ICD-10-CM | POA: Diagnosis not present

## 2021-03-25 DIAGNOSIS — J449 Chronic obstructive pulmonary disease, unspecified: Secondary | ICD-10-CM | POA: Diagnosis not present

## 2021-03-25 DIAGNOSIS — N1832 Chronic kidney disease, stage 3b: Secondary | ICD-10-CM | POA: Diagnosis not present

## 2021-03-25 DIAGNOSIS — I130 Hypertensive heart and chronic kidney disease with heart failure and stage 1 through stage 4 chronic kidney disease, or unspecified chronic kidney disease: Secondary | ICD-10-CM | POA: Diagnosis not present

## 2021-03-25 DIAGNOSIS — S81801D Unspecified open wound, right lower leg, subsequent encounter: Secondary | ICD-10-CM | POA: Diagnosis not present

## 2021-03-25 DIAGNOSIS — I5043 Acute on chronic combined systolic (congestive) and diastolic (congestive) heart failure: Secondary | ICD-10-CM | POA: Diagnosis not present

## 2021-03-25 DIAGNOSIS — I2510 Atherosclerotic heart disease of native coronary artery without angina pectoris: Secondary | ICD-10-CM | POA: Diagnosis not present

## 2021-03-25 DIAGNOSIS — I480 Paroxysmal atrial fibrillation: Secondary | ICD-10-CM | POA: Diagnosis not present

## 2021-03-25 DIAGNOSIS — E1122 Type 2 diabetes mellitus with diabetic chronic kidney disease: Secondary | ICD-10-CM | POA: Diagnosis not present

## 2021-03-29 DIAGNOSIS — R001 Bradycardia, unspecified: Secondary | ICD-10-CM | POA: Diagnosis not present

## 2021-04-02 ENCOUNTER — Other Ambulatory Visit (HOSPITAL_COMMUNITY): Payer: Self-pay

## 2021-04-02 ENCOUNTER — Other Ambulatory Visit (HOSPITAL_COMMUNITY): Payer: Self-pay | Admitting: Cardiology

## 2021-04-02 NOTE — Progress Notes (Signed)
Paramedicine Encounter    Patient ID: Andrew [name], male    DOB: September 18, 1928, 87 y.o.   MRN: [id number]   Patient Care Team: [name], MD as PCP - General (Family Medicine) Larey Dresser, MD as PCP - Advanced Heart Failure (Cardiology) Larey Dresser, MD as Consulting Physician (Cardiology) Jorge Ny, LCSW as Social Worker (Licensed Clinical Social Worker) Gardiner Barefoot, DPM as Consulting Physician (Podiatry) [name], MD (Family Medicine) Patient, No Pcp Per (Inactive) (General Practice)  Patient Active Problem List   Diagnosis Date Noted  . Acute respiratory failure with hypoxia ([place]) 02/20/2021  . COPD (chronic obstructive pulmonary disease) ([place]) 02/20/2021  . History of CVA (cerebrovascular accident)   . Acute respiratory failure with hypercapnia ([place]) 06/02/2020  . Acute metabolic encephalopathy 58/08/9832  . CKD (chronic kidney disease), stage IV ([place]) 06/02/2020  . PAF (paroxysmal atrial fibrillation) ([place]) 06/02/2020  . Hypothyroidism 06/02/2020  . Chronic anticoagulation 06/02/2020  . Debility 06/01/2020  . Lumbar spondylosis 10/04/2018  . Chronic respiratory failure with hypoxia ([place]) 09/16/2018  . Pain of right thumb 06/19/2018  . Bilateral recurrent inguinal hernias 11/28/2017  . Carotid artery stenosis 01/12/2016  . Chronic systolic CHF (congestive heart failure) ([place]) 12/02/2015  . Acute on chronic combined systolic and diastolic CHF (congestive heart failure) ([place]) 11/11/2015  . 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 PVD (peripheral vascular disease) ([place])   . Hereditary and idiopathic peripheral neuropathy 10/02/2015  . Peripelvic (lymphatic) cyst   . Pernicious anemia 07/15/2015  . Bilateral renal cysts 07/12/2015  . Lung nodule, 41mm RML CT 07/12/15 07/12/2015  . Chronic kidney disease, stage 3b ([place]) 06/14/2015  . Paroxysmal atrial fibrillation ([place]) 05/28/2015  . AAA (abdominal aortic aneurysm)  without rupture ([place]) 09/30/2014  . Cardiomyopathy, ischemic 05/23/2014  . DM (diabetes mellitus), type 2 with renal complications ([place]) 82/50/5397  . CAD (coronary artery disease) 11/09/2010  . Depression, major, single episode, complete remission ([place]) 03/18/2010  . Hypothyroidism 02/03/2009  . Osteoarthritis 05/27/2008  . Hyperlipidemia associated with type 2 diabetes mellitus ([place]) 06/14/2007  . Hypertension associated with diabetes ([place]) 06/14/2007  . GERD 06/14/2007    Current Outpatient Medications:  .  acetaminophen (TYLENOL) 500 MG tablet, Take 2 tablets (1,000 mg total) by mouth every 8 (eight) hours as needed., Disp: 30 tablet, Rfl: 0 .  amiodarone (PACERONE) 200 MG tablet, Take 0.5 tablets (100 mg total) by mouth daily., Disp: 45 tablet, Rfl: 3 .  atorvastatin (LIPITOR) 80 MG tablet, Take 1 tablet (80 mg total) by mouth daily. (Patient taking differently: Take 80 mg by mouth every evening.), Disp: 30 tablet, Rfl: 3 .  ELIQUIS 2.5 MG TABS tablet, TAKE 1 TABLET BY MOUTH TWICE DAILY., Disp: 180 tablet, Rfl: 1 .  empagliflozin (JARDIANCE) 10 MG TABS tablet, Take 1 tablet (10 mg total) by mouth daily before breakfast., Disp: 90 tablet, Rfl: 3 .  ezetimibe (ZETIA) 10 MG tablet, TAKE (1) TABLET DAILY., Disp: 90 tablet, Rfl: 3 .  FLUoxetine (PROZAC) 20 MG capsule, TAKE (1) CAPSULE DAILY., Disp: 90 capsule, Rfl: 0 .  gabapentin (NEURONTIN) 100 MG capsule, Take 100 mg by mouth 3 (three) times daily., Disp: , Rfl:  .  hydrALAZINE (APRESOLINE) 50 MG tablet, Take 1 tablet (50 mg total) by mouth 3 (three) times daily., Disp: 90 tablet, Rfl: 3 .  isosorbide mononitrate (IMDUR) 30 MG 24 hr tablet, Take 1 tablet (30 mg total) by mouth daily., Disp: 30 tablet,  Rfl: 3 .  levothyroxine (SYNTHROID) 100 MCG tablet, TAKE 1 TABLET BEFORE BREAKFAST. (Patient taking differently: Take 100 mcg by mouth daily before breakfast.), Disp: 90 tablet, Rfl: 0 .  Multiple Vitamins-Minerals (MULTIVITAMIN ADULTS 50+)  TABS, Take 1 tablet by mouth daily in the afternoon., Disp: , Rfl:  .  pantoprazole (PROTONIX) 40 MG tablet, Take 1 tablet (40 mg total) by mouth 2 (two) times daily., Disp: , Rfl:  .  potassium chloride SA (KLOR-CON) 20 MEQ tablet, TAKE 1 TABLET ONCE DAILY., Disp: 90 tablet, Rfl: 3 .  tiotropium (SPIRIVA) 18 MCG inhalation capsule, Place 18 mcg into inhaler and inhale daily as needed (for wheezing)., Disp: , Rfl:  .  torsemide (DEMADEX) 20 MG tablet, Take 4 tablets (80 mg total) by mouth every morning AND 2 tablets (40 mg total) every evening. (Patient taking differently: Take 4 tablets (80 mg total) by mouth every morning as of 3/15.), Disp: 180 tablet, Rfl: 11 .  metolazone (ZAROXOLYN) 2.5 MG tablet, Take 1 tablet (2.5 mg total) by mouth as directed. By [facility] (Patient not taking: No sig reported), Disp: 3 tablet, Rfl: 0 .  nitroGLYCERIN (NITROSTAT) 0.4 MG SL tablet, DISSOLVE 1 TABLET UNDER TONGUE AS NEEDED FOR CHEST PAIN,MAY REPEAT IN5 MINUTES FOR 2 DOSES. (Patient not taking: No sig reported), Disp: 25 tablet, Rfl: 3 Allergies  Allergen Reactions  . Metoprolol Shortness Of Breath, Palpitations and Other (See Comments)    Heart starts racing. Shallow breathing; pt states he also get skin irritation on his legs  . Metformin Hives and Swelling    On legs  . Metformin And Related Nausea And Vomiting      Social History   Socioeconomic History  . Marital status: Married    Spouse name: Not on file  . Number of children: Not on file  . Years of education: Not on file  . Highest education level: Not on file  Occupational History  . Occupation: Retired  Tobacco Use  . Smoking status: Never Smoker  . Smokeless tobacco: Never Used  . Tobacco comment: quit atleast 25 yrs ago, per pt  Vaping Use  . Vaping Use: Never used  Substance and Sexual Activity  . Alcohol use: Never    Comment: socially  . Drug use: Never  . Sexual activity: Not Currently  Other Topics Concern  . Not on  file  Social History Narrative   ** Merged History Encounter **       Pt lives in [place] with spouse. Retired from [employer].  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   Social Determinants of Health   Financial Resource Strain: Low Risk   . Difficulty of Paying Living Expenses: Not hard at all  Food Insecurity: No Food Insecurity  . Worried About Charity fundraiser in the Last Year: Never true  . Ran Out of Food in the Last Year: Never true  Transportation Needs: No Transportation Needs  . Lack of Transportation (Medical): No  . Lack of Transportation (Non-Medical): No  Physical Activity: Inactive  . Days of Exercise per Week: 0 days  . Minutes of Exercise per Session: 0 min  Stress: No Stress Concern Present  . Feeling of Stress : Not at all  Social Connections: Moderately Isolated  . Frequency of Communication with Friends and Family: More than three times a week  . Frequency of Social Gatherings with Friends and Family: More than three times a week  . Attends  Religious Services: Never  . Active Member of Clubs or Organizations: No  . Attends Archivist Meetings: Never  . Marital Status: Married  Human resources officer Violence: Not At Risk  . Fear of Current or Ex-Partner: No  . Emotionally Abused: No  . Physically Abused: No  . Sexually Abused: No    Physical Exam      Future Appointments  Date Time Provider [place]  04/19/2021  2:00 PM Larey Dresser, MD [facility] None    BP 110/60   Pulse (!) 56   Resp 20   SpO2 93%   Unknown weights  Pt is doing ok, his memory seems to be getting worse. He had another fall the other morning and EMS had to come and get him up at 5am. He does not remember that.  He seems to be doing better with taking meds though.   meds verified and pill box refilled Missing gabapentin but they will deliver it today and wife will place in pill box.   Refilled-  atorvastatin, hydralazine-too soon-will try again prior to next visit. ,  levothyroxine  Marylouise Stacks, [place] [facility] Paramedic  04/02/21

## 2021-04-06 ENCOUNTER — Other Ambulatory Visit (HOSPITAL_COMMUNITY): Payer: Self-pay | Admitting: Cardiology

## 2021-04-12 ENCOUNTER — Other Ambulatory Visit (HOSPITAL_COMMUNITY): Payer: Self-pay | Admitting: Cardiology

## 2021-04-12 DIAGNOSIS — R1030 Lower abdominal pain, unspecified: Secondary | ICD-10-CM | POA: Diagnosis not present

## 2021-04-12 DIAGNOSIS — R1084 Generalized abdominal pain: Secondary | ICD-10-CM | POA: Diagnosis not present

## 2021-04-13 ENCOUNTER — Emergency Department (HOSPITAL_COMMUNITY): Payer: Medicare PPO

## 2021-04-13 ENCOUNTER — Inpatient Hospital Stay (HOSPITAL_COMMUNITY): Payer: Medicare PPO

## 2021-04-13 ENCOUNTER — Inpatient Hospital Stay (HOSPITAL_COMMUNITY)
Admission: EM | Admit: 2021-04-13 | Discharge: 2021-04-19 | DRG: 871 | Disposition: A | Discharge status: Hospice | Payer: Medicare PPO | Attending: Internal Medicine | Admitting: Internal Medicine

## 2021-04-13 ENCOUNTER — Inpatient Hospital Stay: Payer: Self-pay

## 2021-04-13 ENCOUNTER — Encounter (HOSPITAL_COMMUNITY): Payer: Medicare PPO | Admitting: Cardiology

## 2021-04-13 ENCOUNTER — Encounter (HOSPITAL_COMMUNITY): Payer: Self-pay

## 2021-04-13 ENCOUNTER — Other Ambulatory Visit: Payer: Self-pay

## 2021-04-13 DIAGNOSIS — J9811 Atelectasis: Secondary | ICD-10-CM | POA: Diagnosis not present

## 2021-04-13 DIAGNOSIS — H9190 Unspecified hearing loss, unspecified ear: Secondary | ICD-10-CM | POA: Diagnosis present

## 2021-04-13 DIAGNOSIS — L97929 Non-pressure chronic ulcer of unspecified part of left lower leg with unspecified severity: Secondary | ICD-10-CM | POA: Diagnosis not present

## 2021-04-13 DIAGNOSIS — I517 Cardiomegaly: Secondary | ICD-10-CM | POA: Diagnosis not present

## 2021-04-13 DIAGNOSIS — K8080 Other cholelithiasis without obstruction: Secondary | ICD-10-CM | POA: Diagnosis not present

## 2021-04-13 DIAGNOSIS — Z515 Encounter for palliative care: Secondary | ICD-10-CM | POA: Diagnosis not present

## 2021-04-13 DIAGNOSIS — G9341 Metabolic encephalopathy: Secondary | ICD-10-CM | POA: Diagnosis present

## 2021-04-13 DIAGNOSIS — A419 Sepsis, unspecified organism: Principal | ICD-10-CM | POA: Diagnosis present

## 2021-04-13 DIAGNOSIS — J9621 Acute and chronic respiratory failure with hypoxia: Secondary | ICD-10-CM | POA: Diagnosis present

## 2021-04-13 DIAGNOSIS — Z955 Presence of coronary angioplasty implant and graft: Secondary | ICD-10-CM

## 2021-04-13 DIAGNOSIS — L0390 Cellulitis, unspecified: Secondary | ICD-10-CM | POA: Diagnosis not present

## 2021-04-13 DIAGNOSIS — K219 Gastro-esophageal reflux disease without esophagitis: Secondary | ICD-10-CM | POA: Diagnosis present

## 2021-04-13 DIAGNOSIS — Z7189 Other specified counseling: Secondary | ICD-10-CM | POA: Diagnosis not present

## 2021-04-13 DIAGNOSIS — M7989 Other specified soft tissue disorders: Secondary | ICD-10-CM | POA: Diagnosis not present

## 2021-04-13 DIAGNOSIS — I480 Paroxysmal atrial fibrillation: Secondary | ICD-10-CM | POA: Diagnosis present

## 2021-04-13 DIAGNOSIS — N184 Chronic kidney disease, stage 4 (severe): Secondary | ICD-10-CM

## 2021-04-13 DIAGNOSIS — R911 Solitary pulmonary nodule: Secondary | ICD-10-CM | POA: Diagnosis present

## 2021-04-13 DIAGNOSIS — R0602 Shortness of breath: Secondary | ICD-10-CM | POA: Diagnosis not present

## 2021-04-13 DIAGNOSIS — E039 Hypothyroidism, unspecified: Secondary | ICD-10-CM | POA: Diagnosis present

## 2021-04-13 DIAGNOSIS — M1990 Unspecified osteoarthritis, unspecified site: Secondary | ICD-10-CM | POA: Diagnosis present

## 2021-04-13 DIAGNOSIS — R1030 Lower abdominal pain, unspecified: Secondary | ICD-10-CM | POA: Diagnosis present

## 2021-04-13 DIAGNOSIS — Z85820 Personal history of malignant melanoma of skin: Secondary | ICD-10-CM

## 2021-04-13 DIAGNOSIS — M898X9 Other specified disorders of bone, unspecified site: Secondary | ICD-10-CM | POA: Diagnosis present

## 2021-04-13 DIAGNOSIS — I714 Abdominal aortic aneurysm, without rupture: Secondary | ICD-10-CM | POA: Diagnosis present

## 2021-04-13 DIAGNOSIS — L03116 Cellulitis of left lower limb: Secondary | ICD-10-CM | POA: Diagnosis present

## 2021-04-13 DIAGNOSIS — R339 Retention of urine, unspecified: Secondary | ICD-10-CM | POA: Diagnosis not present

## 2021-04-13 DIAGNOSIS — I214 Non-ST elevation (NSTEMI) myocardial infarction: Secondary | ICD-10-CM

## 2021-04-13 DIAGNOSIS — Z7901 Long term (current) use of anticoagulants: Secondary | ICD-10-CM

## 2021-04-13 DIAGNOSIS — Z833 Family history of diabetes mellitus: Secondary | ICD-10-CM

## 2021-04-13 DIAGNOSIS — Z79899 Other long term (current) drug therapy: Secondary | ICD-10-CM

## 2021-04-13 DIAGNOSIS — Z951 Presence of aortocoronary bypass graft: Secondary | ICD-10-CM

## 2021-04-13 DIAGNOSIS — I5021 Acute systolic (congestive) heart failure: Secondary | ICD-10-CM

## 2021-04-13 DIAGNOSIS — Z789 Other specified health status: Secondary | ICD-10-CM | POA: Diagnosis not present

## 2021-04-13 DIAGNOSIS — L03115 Cellulitis of right lower limb: Secondary | ICD-10-CM | POA: Diagnosis present

## 2021-04-13 DIAGNOSIS — J479 Bronchiectasis, uncomplicated: Secondary | ICD-10-CM | POA: Diagnosis present

## 2021-04-13 DIAGNOSIS — E1122 Type 2 diabetes mellitus with diabetic chronic kidney disease: Secondary | ICD-10-CM | POA: Diagnosis present

## 2021-04-13 DIAGNOSIS — J90 Pleural effusion, not elsewhere classified: Secondary | ICD-10-CM | POA: Diagnosis not present

## 2021-04-13 DIAGNOSIS — I252 Old myocardial infarction: Secondary | ICD-10-CM

## 2021-04-13 DIAGNOSIS — D631 Anemia in chronic kidney disease: Secondary | ICD-10-CM | POA: Diagnosis present

## 2021-04-13 DIAGNOSIS — I130 Hypertensive heart and chronic kidney disease with heart failure and stage 1 through stage 4 chronic kidney disease, or unspecified chronic kidney disease: Secondary | ICD-10-CM | POA: Diagnosis not present

## 2021-04-13 DIAGNOSIS — R0902 Hypoxemia: Secondary | ICD-10-CM

## 2021-04-13 DIAGNOSIS — R1111 Vomiting without nausea: Secondary | ICD-10-CM | POA: Diagnosis not present

## 2021-04-13 DIAGNOSIS — I5084 End stage heart failure: Secondary | ICD-10-CM | POA: Diagnosis present

## 2021-04-13 DIAGNOSIS — N179 Acute kidney failure, unspecified: Secondary | ICD-10-CM | POA: Diagnosis present

## 2021-04-13 DIAGNOSIS — Z8673 Personal history of transient ischemic attack (TIA), and cerebral infarction without residual deficits: Secondary | ICD-10-CM

## 2021-04-13 DIAGNOSIS — Z95828 Presence of other vascular implants and grafts: Secondary | ICD-10-CM

## 2021-04-13 DIAGNOSIS — I2510 Atherosclerotic heart disease of native coronary artery without angina pectoris: Secondary | ICD-10-CM | POA: Diagnosis present

## 2021-04-13 DIAGNOSIS — I5023 Acute on chronic systolic (congestive) heart failure: Secondary | ICD-10-CM | POA: Diagnosis present

## 2021-04-13 DIAGNOSIS — I255 Ischemic cardiomyopathy: Secondary | ICD-10-CM | POA: Diagnosis present

## 2021-04-13 DIAGNOSIS — K8020 Calculus of gallbladder without cholecystitis without obstruction: Secondary | ICD-10-CM | POA: Diagnosis not present

## 2021-04-13 DIAGNOSIS — K760 Fatty (change of) liver, not elsewhere classified: Secondary | ICD-10-CM | POA: Diagnosis present

## 2021-04-13 DIAGNOSIS — I483 Typical atrial flutter: Secondary | ICD-10-CM | POA: Diagnosis not present

## 2021-04-13 DIAGNOSIS — E1151 Type 2 diabetes mellitus with diabetic peripheral angiopathy without gangrene: Secondary | ICD-10-CM | POA: Diagnosis present

## 2021-04-13 DIAGNOSIS — Z7951 Long term (current) use of inhaled steroids: Secondary | ICD-10-CM

## 2021-04-13 DIAGNOSIS — N1832 Chronic kidney disease, stage 3b: Secondary | ICD-10-CM | POA: Diagnosis present

## 2021-04-13 DIAGNOSIS — L97919 Non-pressure chronic ulcer of unspecified part of right lower leg with unspecified severity: Secondary | ICD-10-CM | POA: Diagnosis not present

## 2021-04-13 DIAGNOSIS — R197 Diarrhea, unspecified: Secondary | ICD-10-CM | POA: Diagnosis not present

## 2021-04-13 DIAGNOSIS — Z20822 Contact with and (suspected) exposure to covid-19: Secondary | ICD-10-CM | POA: Diagnosis present

## 2021-04-13 DIAGNOSIS — Z83438 Family history of other disorder of lipoprotein metabolism and other lipidemia: Secondary | ICD-10-CM

## 2021-04-13 DIAGNOSIS — E8809 Other disorders of plasma-protein metabolism, not elsewhere classified: Secondary | ICD-10-CM | POA: Diagnosis present

## 2021-04-13 DIAGNOSIS — Z79890 Hormone replacement therapy (postmenopausal): Secondary | ICD-10-CM

## 2021-04-13 DIAGNOSIS — Z66 Do not resuscitate: Secondary | ICD-10-CM | POA: Diagnosis present

## 2021-04-13 DIAGNOSIS — R7989 Other specified abnormal findings of blood chemistry: Secondary | ICD-10-CM | POA: Diagnosis not present

## 2021-04-13 DIAGNOSIS — D649 Anemia, unspecified: Secondary | ICD-10-CM | POA: Diagnosis not present

## 2021-04-13 DIAGNOSIS — N281 Cyst of kidney, acquired: Secondary | ICD-10-CM | POA: Diagnosis not present

## 2021-04-13 DIAGNOSIS — Z9119 Patient's noncompliance with other medical treatment and regimen: Secondary | ICD-10-CM

## 2021-04-13 DIAGNOSIS — E785 Hyperlipidemia, unspecified: Secondary | ICD-10-CM | POA: Diagnosis present

## 2021-04-13 DIAGNOSIS — Z87440 Personal history of urinary (tract) infections: Secondary | ICD-10-CM

## 2021-04-13 DIAGNOSIS — Z888 Allergy status to other drugs, medicaments and biological substances status: Secondary | ICD-10-CM

## 2021-04-13 DIAGNOSIS — Z8249 Family history of ischemic heart disease and other diseases of the circulatory system: Secondary | ICD-10-CM

## 2021-04-13 DIAGNOSIS — J811 Chronic pulmonary edema: Secondary | ICD-10-CM | POA: Diagnosis not present

## 2021-04-13 LAB — LIPASE, BLOOD: Lipase: 58 U/L — ABNORMAL HIGH (ref 11–51)

## 2021-04-13 LAB — CBC WITH DIFFERENTIAL/PLATELET
Abs Immature Granulocytes: 0.11 10*3/uL — ABNORMAL HIGH (ref 0.00–0.07)
Basophils Absolute: 0 10*3/uL (ref 0.0–0.1)
Basophils Relative: 0 %
Eosinophils Absolute: 0 10*3/uL (ref 0.0–0.5)
Eosinophils Relative: 0 %
HCT: 36.6 % — ABNORMAL LOW (ref 39.0–52.0)
Hemoglobin: 11.4 g/dL — ABNORMAL LOW (ref 13.0–17.0)
Immature Granulocytes: 1 %
Lymphocytes Relative: 5 %
Lymphs Abs: 0.7 10*3/uL (ref 0.7–4.0)
MCH: 29.6 pg (ref 26.0–34.0)
MCHC: 31.1 g/dL (ref 30.0–36.0)
MCV: 95.1 fL (ref 80.0–100.0)
Monocytes Absolute: 1.3 10*3/uL — ABNORMAL HIGH (ref 0.1–1.0)
Monocytes Relative: 9 %
Neutro Abs: 12.5 10*3/uL — ABNORMAL HIGH (ref 1.7–7.7)
Neutrophils Relative %: 85 %
Platelets: 203 10*3/uL (ref 150–400)
RBC: 3.85 MIL/uL — ABNORMAL LOW (ref 4.22–5.81)
RDW: 15 % (ref 11.5–15.5)
WBC: 14.6 10*3/uL — ABNORMAL HIGH (ref 4.0–10.5)
nRBC: 0 % (ref 0.0–0.2)

## 2021-04-13 LAB — ECHOCARDIOGRAM COMPLETE
Area-P 1/2: 5.04 cm2
Calc EF: 27.8 %
Height: 67 in
S' Lateral: 3.1 cm
Single Plane A2C EF: 18.2 %
Single Plane A4C EF: 35.7 %
Weight: 2720 oz

## 2021-04-13 LAB — COMPREHENSIVE METABOLIC PANEL
ALT: 33 U/L (ref 0–44)
AST: 40 U/L (ref 15–41)
Albumin: 3.3 g/dL — ABNORMAL LOW (ref 3.5–5.0)
Alkaline Phosphatase: 92 U/L (ref 38–126)
Anion gap: 12 (ref 5–15)
BUN: 70 mg/dL — ABNORMAL HIGH (ref 8–23)
CO2: 23 mmol/L (ref 22–32)
Calcium: 8.9 mg/dL (ref 8.9–10.3)
Chloride: 106 mmol/L (ref 98–111)
Creatinine, Ser: 2.31 mg/dL — ABNORMAL HIGH (ref 0.61–1.24)
GFR, Estimated: 26 mL/min — ABNORMAL LOW (ref >60)
Glucose, Bld: 144 mg/dL — ABNORMAL HIGH (ref 70–99)
Potassium: 4.5 mmol/L (ref 3.5–5.1)
Sodium: 141 mmol/L (ref 135–145)
Total Bilirubin: 1.1 mg/dL (ref 0.3–1.2)
Total Protein: 6.8 g/dL (ref 6.5–8.1)

## 2021-04-13 LAB — GLUCOSE, CAPILLARY: Glucose-Capillary: 92 mg/dL (ref 70–99)

## 2021-04-13 LAB — URINALYSIS, ROUTINE W REFLEX MICROSCOPIC
Bacteria, UA: NONE SEEN
Bilirubin Urine: NEGATIVE
Glucose, UA: 50 mg/dL — AB
Hgb urine dipstick: NEGATIVE
Ketones, ur: NEGATIVE mg/dL
Leukocytes,Ua: NEGATIVE
Nitrite: NEGATIVE
Protein, ur: 30 mg/dL — AB
Specific Gravity, Urine: 1.014 (ref 1.005–1.030)
pH: 5 (ref 5.0–8.0)

## 2021-04-13 LAB — APTT: aPTT: 33 seconds (ref 24–36)

## 2021-04-13 LAB — TROPONIN I (HIGH SENSITIVITY)
Troponin I (High Sensitivity): 1562 ng/L (ref <18)
Troponin I (High Sensitivity): 1945 ng/L (ref <18)
Troponin I (High Sensitivity): 3107 ng/L (ref <18)
Troponin I (High Sensitivity): 4008 ng/L (ref <18)

## 2021-04-13 LAB — RESP PANEL BY RT-PCR (FLU A&B, COVID) ARPGX2
Influenza A by PCR: NEGATIVE
Influenza B by PCR: NEGATIVE
SARS Coronavirus 2 by RT PCR: NEGATIVE

## 2021-04-13 LAB — COOXEMETRY PANEL
Carboxyhemoglobin: 1.5 % (ref 0.5–1.5)
Methemoglobin: 1 % (ref 0.0–1.5)
O2 Saturation: 57.1 %
Total hemoglobin: 10.8 g/dL — ABNORMAL LOW (ref 12.0–16.0)

## 2021-04-13 LAB — BRAIN NATRIURETIC PEPTIDE: B Natriuretic Peptide: >4500 pg/mL — ABNORMAL HIGH (ref 0.0–100.0)

## 2021-04-13 LAB — HEPARIN LEVEL (UNFRACTIONATED): Heparin Unfractionated: 0.81 IU/mL — ABNORMAL HIGH (ref 0.30–0.70)

## 2021-04-13 MED ORDER — GABAPENTIN 100 MG PO CAPS
100.0000 mg | ORAL_CAPSULE | Freq: Three times a day (TID) | ORAL | Status: DC
  Administered 2021-04-13 – 2021-04-17 (×12): 100 mg via ORAL
  Filled 2021-04-13 (×12): qty 1

## 2021-04-13 MED ORDER — ONDANSETRON HCL 4 MG/2ML IJ SOLN
4.0000 mg | Freq: Once | INTRAMUSCULAR | Status: AC
  Administered 2021-04-13: 4 mg via INTRAVENOUS
  Filled 2021-04-13: qty 2

## 2021-04-13 MED ORDER — SODIUM CHLORIDE 0.9% FLUSH
10.0000 mL | INTRAVENOUS | Status: DC | PRN
  Administered 2021-04-14: 10 mL

## 2021-04-13 MED ORDER — MORPHINE SULFATE (PF) 4 MG/ML IV SOLN
4.0000 mg | Freq: Once | INTRAVENOUS | Status: AC
  Administered 2021-04-13: 4 mg via INTRAVENOUS
  Filled 2021-04-13: qty 1

## 2021-04-13 MED ORDER — CHLORHEXIDINE GLUCONATE CLOTH 2 % EX PADS
6.0000 | MEDICATED_PAD | Freq: Every day | CUTANEOUS | Status: DC
  Administered 2021-04-14 – 2021-04-17 (×4): 6 via TOPICAL

## 2021-04-13 MED ORDER — FUROSEMIDE 10 MG/ML IJ SOLN
80.0000 mg | Freq: Two times a day (BID) | INTRAMUSCULAR | Status: DC
  Administered 2021-04-13: 80 mg via INTRAVENOUS
  Filled 2021-04-13: qty 8

## 2021-04-13 MED ORDER — ACETAMINOPHEN 325 MG PO TABS: 650.0000 mg | ORAL_TABLET | ORAL | Status: DC | PRN

## 2021-04-13 MED ORDER — FLUOXETINE HCL 20 MG PO CAPS
20.0000 mg | ORAL_CAPSULE | Freq: Every day | ORAL | Status: DC
  Administered 2021-04-14 – 2021-04-17 (×4): 20 mg via ORAL
  Filled 2021-04-13 (×4): qty 1

## 2021-04-13 MED ORDER — IOHEXOL 9 MG/ML PO SOLN
ORAL | Status: AC
  Filled 2021-04-13: qty 1000

## 2021-04-13 MED ORDER — FUROSEMIDE 10 MG/ML IJ SOLN: 80.0000 mg | Freq: Two times a day (BID) | INTRAMUSCULAR | Status: DC

## 2021-04-13 MED ORDER — EZETIMIBE 10 MG PO TABS
10.0000 mg | ORAL_TABLET | Freq: Every day | ORAL | Status: DC
  Administered 2021-04-14 – 2021-04-17 (×4): 10 mg via ORAL
  Filled 2021-04-13 (×4): qty 1

## 2021-04-13 MED ORDER — VANCOMYCIN HCL 500 MG/100ML IV SOLN: 500.0000 mg | Freq: Once | INTRAVENOUS | Status: DC

## 2021-04-13 MED ORDER — HEPARIN (PORCINE) 25000 UT/250ML-% IV SOLN
1050.0000 [IU]/h | INTRAVENOUS | Status: DC
  Administered 2021-04-13: 950 [IU]/h via INTRAVENOUS
  Administered 2021-04-15: 1050 [IU]/h via INTRAVENOUS
  Filled 2021-04-13 (×3): qty 250

## 2021-04-13 MED ORDER — SODIUM CHLORIDE 0.9 % IV SOLN
1.0000 g | INTRAVENOUS | Status: DC
  Administered 2021-04-13 – 2021-04-16 (×4): 1 g via INTRAVENOUS
  Filled 2021-04-13 (×4): qty 10

## 2021-04-13 MED ORDER — SODIUM CHLORIDE 0.9 % IV BOLUS: 1000.0000 mL | Freq: Once | INTRAVENOUS | Status: DC

## 2021-04-13 MED ORDER — ONDANSETRON HCL 4 MG/2ML IJ SOLN: 4.0000 mg | Freq: Four times a day (QID) | INTRAMUSCULAR | Status: DC | PRN

## 2021-04-13 MED ORDER — MILRINONE LACTATE IN DEXTROSE 20-5 MG/100ML-% IV SOLN: 0.2500 ug/kg/min | INTRAVENOUS | Status: DC

## 2021-04-13 MED ORDER — LEVOTHYROXINE SODIUM 100 MCG PO TABS
100.0000 ug | ORAL_TABLET | Freq: Every day | ORAL | Status: DC
  Administered 2021-04-14 – 2021-04-17 (×4): 100 ug via ORAL
  Filled 2021-04-13 (×4): qty 1

## 2021-04-13 MED ORDER — AMIODARONE HCL 100 MG PO TABS
100.0000 mg | ORAL_TABLET | Freq: Every day | ORAL | Status: DC
  Administered 2021-04-13 – 2021-04-17 (×5): 100 mg via ORAL
  Filled 2021-04-13 (×5): qty 1

## 2021-04-13 MED ORDER — VANCOMYCIN HCL 1000 MG/200ML IV SOLN
1000.0000 mg | INTRAVENOUS | Status: DC
  Administered 2021-04-15: 1000 mg via INTRAVENOUS
  Filled 2021-04-13: qty 200

## 2021-04-13 MED ORDER — SODIUM CHLORIDE 0.9% FLUSH
10.0000 mL | Freq: Two times a day (BID) | INTRAVENOUS | Status: DC
  Administered 2021-04-14 – 2021-04-16 (×5): 10 mL
  Administered 2021-04-16: 20 mL
  Administered 2021-04-19: 10 mL

## 2021-04-13 MED ORDER — VANCOMYCIN HCL 1250 MG/250ML IV SOLN
1250.0000 mg | Freq: Once | INTRAVENOUS | Status: AC
  Administered 2021-04-13: 1250 mg via INTRAVENOUS
  Filled 2021-04-13: qty 250

## 2021-04-13 MED ORDER — EMPAGLIFLOZIN 10 MG PO TABS: 10.0000 mg | ORAL_TABLET | Freq: Every day | ORAL | Status: DC

## 2021-04-13 MED ORDER — ATORVASTATIN CALCIUM 80 MG PO TABS
80.0000 mg | ORAL_TABLET | Freq: Every evening | ORAL | Status: DC
  Administered 2021-04-13 – 2021-04-16 (×4): 80 mg via ORAL
  Filled 2021-04-13 (×4): qty 1

## 2021-04-13 MED ORDER — PERFLUTREN LIPID MICROSPHERE
1.0000 mL | INTRAVENOUS | Status: AC | PRN
  Administered 2021-04-13: 2 mL via INTRAVENOUS
  Filled 2021-04-13: qty 10

## 2021-04-13 MED ORDER — SODIUM CHLORIDE 0.9 % IV BOLUS
250.0000 mL | Freq: Once | INTRAVENOUS | Status: AC
Start: 2021-04-13 — End: 2021-04-13
  Administered 2021-04-13: 250 mL via INTRAVENOUS

## 2021-04-13 MED ORDER — NITROGLYCERIN 0.4 MG SL SUBL: 0.4000 mg | SUBLINGUAL_TABLET | SUBLINGUAL | Status: DC | PRN

## 2021-04-13 NOTE — ED Notes (Signed)
Attempted to call report to [facility] x2. No answer. Will re-attempt.

## 2021-04-13 NOTE — ED Notes (Signed)
Xray at the bedside.

## 2021-04-13 NOTE — ED Notes (Signed)
Pt resting with eyes closed. Cardiac monitor attached x3. 3L nasal cannula O2. Pt awakens to voice and falls back asleep. Provider notified and aware.

## 2021-04-13 NOTE — Progress Notes (Signed)
Attempts to contact wife for consent unsuccessful.  No answer to cell phone or home phone numbers.  Baxter Flattery, RN updated. She will update MD.  IV team will attempt to contact wife again later.  Patient is confused and unable to give consent.

## 2021-04-13 NOTE — ED Notes (Signed)
Pt responsive to voice and answers correctly to who he is and where he is. Asks for water at this time, then falls back asleep.

## 2021-04-13 NOTE — ED Notes (Signed)
ED Provider notified of pt's repeat Troponin of 1,945. No additional orders at this time. Will continue monitor.

## 2021-04-13 NOTE — ED Notes (Addendum)
Pt awakens to voice and is oriented to person and place. Attached to cardiac monitor x3. VSS at this time. Repeat troponin obtained and sent to lab at this time.

## 2021-04-13 NOTE — Progress Notes (Addendum)
[place] for heparin Indication: NSTEMI  Allergies  Allergen Reactions  . Metoprolol Shortness Of Breath, Palpitations and Other (See Comments)    Heart starts racing. Shallow breathing; pt states he also get skin irritation on his legs  . Metformin Hives and Swelling    On legs  . Metformin And Related Nausea And Vomiting    Patient Measurements: Height: 5\' 7"  (170.2 cm) Weight: 77.1 kg (170 lb) IBW/kg (Calculated) : 66.1 Heparin Dosing Weight: 77 kg  Vital Signs: Temp: 98.8 F (37.1 C) (04/19 1053) Temp Source: Oral (04/19 1053) BP: 102/59 (04/19 1415) Pulse Rate: 74 (04/19 1415)  Labs: Recent Labs    04/13/21 0124 04/13/21 1051 04/13/21 1228  HGB 11.4*  --   --   HCT 36.6*  --   --   PLT 203  --   --   CREATININE 2.31*  --   --   TROPONINIHS  --  1,562* 1,945*    Estimated Creatinine Clearance: 19.9 mL/min (A) (by C-G formula based on SCr of 2.31 mg/dL (H)).   Medications:  - on Eliqis 2.5 mg bid PTA (last dose taken on 4/17 at 1800)  Assessment: Patient is a 87 y.o M with hx CAD (s/p CABG), ischemic cardiomyopathy, CVA and afib on Eliquis PTA, presented to the ED on 4/19 with c/o abdominal pain and decreased appetite.  He was found to have diffuse erythema of the LE and elevated troponin.  Pharmacy has been consulted to start heparin drip for NSTEMI.  Goal of Therapy:  Heparin level 0.3-0.7 units/ml aPTT 66-102 seconds Monitor platelets by anticoagulation protocol: Yes   Plan:  - baseline heparin level and aPTT now - heparin drip at 950 units/hr - check 8 hr heparin level and aPTT - monitor for s/sx bleeding  Lynelle Doctor 04/13/2021,2:24 PM

## 2021-04-13 NOTE — ED Notes (Signed)
Hospitalist at the bedside 

## 2021-04-13 NOTE — Progress Notes (Signed)
  Echocardiogram 2D Echocardiogram has been performed.  Andrew [name] 04/13/2021, 3:50 PM

## 2021-04-13 NOTE — ED Notes (Signed)
Report received from Trudi Ida, Therapist, sports.

## 2021-04-13 NOTE — Progress Notes (Signed)
Peripherally Inserted Central Catheter Placement  The IV Nurse has discussed with the patient and/or persons authorized to consent for the patient, the purpose of this procedure and the potential benefits and risks involved with this procedure.  The benefits include less needle sticks, lab draws from the catheter, and the patient may be discharged home with the catheter. Risks include, but not limited to, infection, bleeding, blood clot (thrombus formation), and puncture of an artery; nerve damage and irregular heartbeat and possibility to perform a PICC exchange if needed/ordered by physician.  Alternatives to this procedure were also discussed.  Bard Power PICC patient education guide, fact sheet on infection prevention and patient information card has been provided to patient /or left at bedside.  PICC inserted by Vilinda Boehringer, RN   PICC Placement Documentation  PICC Triple Lumen 70/76/15 PICC Right Basilic 43 cm 0 cm (Active)  Indication for Insertion or Continuance of Line Vasoactive infusions;Prolonged intravenous therapies;Chronic illness with exacerbations (CF, Sickle Cell, etc.) 04/13/21 2040  Exposed Catheter (cm) 0 cm 04/13/21 2040  Site Assessment Clean;Dry;Intact 04/13/21 2040  Lumen #1 Status Flushed;Saline locked;Blood return noted 04/13/21 2040  Lumen #2 Status Flushed;Saline locked;Blood return noted 04/13/21 2040  Lumen #3 Status Flushed;Saline locked;Blood return noted 04/13/21 2040  Dressing Type Transparent 04/13/21 2040  Dressing Status Clean;Dry;Intact 04/13/21 2040  Antimicrobial disc in place? Yes 04/13/21 2040  Safety Lock Not Applicable 18/34/37 3578  Line Care Connections checked and tightened 04/13/21 2040  Line Adjustment (NICU/IV Team Only) No 04/13/21 2040  Dressing Intervention New dressing 04/13/21 2040  Dressing Change Due 04/20/21 04/13/21 2040       Azhar Yogi, Nicolette Bang 04/13/2021, 8:40 PM

## 2021-04-13 NOTE — Plan of Care (Signed)

## 2021-04-13 NOTE — ED Notes (Signed)
Pt to be admitted and transferred to [facility]. Attempted to call report, nurse unable to take report at this time. Will re-attempt.

## 2021-04-13 NOTE — Consult Note (Addendum)
Advanced Heart Failure Team Consult Note   Primary Physician: [name], MD PCP-Cardiologist:  Loralie Champagne, MD  Reason for Consultation: Acute on chronic systolic heart failure   HPI:    Andrew [name] is seen today for evaluation of acute on chronic systolic heart failure at the request of Dr. Margaretann Loveless, Cardiology.    Andrew [name] is a 87 y.o. male who has a history of CKD, CAD s/p CABG, and ischemic cardiomyopathy with primarily diastolic CHF. He has PAD followed at [facility]. Underwent LHC in 11/15. 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 He had had occlusion of a relatively small LAD, there were right to left collaterals and no intervention was done (medical management).   He and his wife went on a cruise along the [employer] in 5/16. He admits to considerable dietary indiscretion (high sodium diet). It appears that he developed a hypertensive crisis along with chest pain while on the ship. He went into atrial fibrillation and developed CHF. He was taken to the hospital in [place], [place]. He was in the ICU for about a week on Bipap intermittently. Troponin peaked at 0.09 during this admission. He was diuresed and after about 2 wks left the hospital and was able to fly home.   Cardiolite (6/16) showed EF 42%, prior anterolateral MI, no ischemia. Echo in 10/16 with EF 40-45%.   He was admitted on 07/06/15 for RLQ pain and fever. He was treated for UTI and had a kidney stone as well. He developed SOB after IVF were given for AKI. He was treated for acute COPD exacerbation as well as CHF decompensation. He was diuresed with IV lasix. His weight on admission was 179 and got as high as 188 after IVF. Discharge weight was 181.  He was admitted in 10/16 with a cerebellar CVA. He has some resultant imbalance.   He was again admitted in 11/16 with acute on chronic systolic CHF. This was a short admission that appeared to be precipitated by a sodium load from  eating at a [employer] .   3/18 Echo showed stable EF 40-45%. He was also having chest pain and had Cardiolite in 3/18 that showe no ischemia.   He was admitted in 11/18 for elective hernia repair. Post-op, he developed hypoxemia and had a rehab stay. He is now off oxygen.   Creatinine increased to 2.97 in 1/19. I cut back on losartan and Lasix. He developed increased lower extremity edema and we increased Lasix back to 120 qam/80 qpm.  He had a mechanical fall in 7/20 (tripped). He hit his head but refused to go to the ER.  Later in 7/20, he was admitted with fever and started on antibiotics with concern for PNA, but CT chest did not show definite PNA.  ?Viral syndrome.  COVID-19 was negative.   Echo in 8/20 showed  EF 45-50% with anterolateral hypokinesis, normal RV.   He was admitted in 6/21 with aspiration PNA and COPD exacerbation, sent to [facility] for rehab afterwards.   Patient developed a left heel ulcer. In 8/21, he had peripheral angiogram by Dr. Donzetta Matters showing multiple left SFA stenoses > 50% but no occlusion. The right SFA was occluded with recanalization.  No interventional option.   Echo in 9/21 showed EF 40-45%, anterolateral hypokinesis, normal RV, mild mitral stenosis with mean gradient 7 mmHg, IVC dilated.   Last seen by Dr. [name] 1/22. Leg ulcer had healed and he denied claudication. Wt  was down 2 lb and denied dyspnea w/ ADLs. Overall was feeling pretty good.  Torsemide was reduced to 80 qam/40 qpm and Farxiga 10 mg was added.   He was admitted 2/22 for a/c CHF. Admitted by [facility] and general cardiology consulted. Diuresed w/ IV Lasix and transitioned back to torsemide 80 qm/ 40 qpm w/ recs to take metolazone PRN as directed by the [facility]. Also, Coreg was decreased due to bradycardia. Echocardiogram showed global hypokinesis and akinesis of the inferolateral walls. EF was 25 to 30% (lower from prior). Hs trop was 36>>72. However decision was made not to  peruse cath given lack of chest pain, age and fact that would not want invasive procedures if he can be managed medically. Cilostazol for PAD was discontinued due to CHF. D/c wt was 170 lb.  He had post hospital f/u in the [facility] 3/22 and was doing fairly well. He was euvolemic w/ NYHA Class II-early III symptoms and denied claudication. Coreg was further reduced to 3.125 mg bid given persistent sinus brady w/ HR in the upper 40s.   He presented to the [facility] ED earlier today w/ CC or RUQ abdominal pain + bilateral leg wounds. Had been followed by the wound care clinic but had stopped going b/c he was "tired of the hassle of going".   CT of abdomen and pelvis was revealing for cholelithiasis w/o acute inflammation. Felt to have bilateral LE cellulitis based on exam. X-ray negative for osteo. CXR showed cardiomegaly w/ mild bilateral interstitial prominence and small bilateral pleural effusion c/w CHF. BNP >4,500. Hs trop also markedly elevated, 1,562>>1,945. EKG showed NSR, 83 bpm, w/ nonspecific IVCD with LAD. WBC mildly elevated 14.6. AF. Hgb 11.4, K 4.5, Na 141, SCr 2.31 (baseline ~1.6). AST/ ALT WNL at 40 and 33 respectively. BP soft, SBPs upper 80s-low 100s.   He was admitted for [facility] for sepsis 2/2 bilateral LE cellulitis + a/c CHF. Started on IV abx and IV Lasix. General cardiology initially consulted but given concerns for possible component of cardiogenic shock, pt has been transferred to [facility] for further care and AHF consultation and possible initiation of inotrope's.   Echo today shows LVEF 25-30%. RV moderately reduced. RA pressure ~15.  He appears mildly confused but no distress. On 4L , O2 sats 97%.    Review of Systems: [y] = yes, [ ]  = no   . General: Weight gain [ ] ; Weight loss [ ] ; Anorexia [ ] ; Fatigue [Y ]; Fever [ ] ; Chills [ ] ; Weakness [ ]   . Cardiac: Chest pain/pressure [ ] ; Resting SOB [ Y]; Exertional SOB [ ] ; Orthopnea [ ] ; Pedal Edema [ ] ; Palpitations [ ] ; Syncope [ ] ;  Presyncope [ ] ; Paroxysmal nocturnal dyspnea[ ]   . Pulmonary: Cough [ ] ; Wheezing[ ] ; Hemoptysis[ ] ; Sputum [ ] ; Snoring [ ]   . GI: Vomiting[ ] ; Dysphagia[ ] ; Melena[ ] ; Hematochezia [ ] ; Heartburn[ ] ; Abdominal pain [ Y]; Constipation [ ] ; Diarrhea [ ] ; BRBPR [ ]   . GU: Hematuria[ ] ; Dysuria [ ] ; Nocturia[ ]   . Vascular: Pain in legs with walking [ ] ; Pain in feet with lying flat [ ] ; Non-healing sores [ Y]; Stroke [ ] ; TIA [ ] ; Slurred speech [ ] ;  . Neuro: Headaches[ ] ; Vertigo[ ] ; Seizures[ ] ; Paresthesias[ ] ;Blurred vision [ ] ; Diplopia [ ] ; Vision changes [ ]   . Ortho/Skin: Arthritis [ ] ; Joint pain [ ] ; Muscle pain [ ] ; Joint swelling [ ] ; Back Pain [ ] ; Rash [Y ]  .  Psych: Depression[ ] ; Anxiety[ ]   . Heme: Bleeding problems [ ] ; Clotting disorders [ ] ; Anemia [ ]   . Endocrine: Diabetes [ ] ; Thyroid dysfunction[ ]   Home Medications Prior to Admission medications   Medication Sig Start Date End Date Taking? Authorizing Provider  acetaminophen (TYLENOL) 500 MG tablet Take 2 tablets (1,000 mg total) by mouth every 8 (eight) hours as needed. 06/01/20  Yes [name], MD  amiodarone (PACERONE) 200 MG tablet Take 0.5 tablets (100 mg total) by mouth daily. 11/12/20  Yes Larey Dresser, MD  atorvastatin (LIPITOR) 80 MG tablet Take 1 tablet (80 mg total) by mouth daily. Patient taking differently: Take 80 mg by mouth every evening. 11/26/20  Yes Larey Dresser, MD  carvedilol (COREG) 6.25 MG tablet Take 6.25 mg by mouth 2 (two) times daily with a meal.   Yes [provider]  ELIQUIS 2.5 MG TABS tablet TAKE 1 TABLET BY MOUTH TWICE DAILY. Patient taking differently: Take 2.5 mg by mouth 2 (two) times daily. 01/12/21  Yes Larey Dresser, MD  empagliflozin (JARDIANCE) 10 MG TABS tablet Take 1 tablet (10 mg total) by mouth daily before breakfast. 01/14/21  Yes Larey Dresser, MD  ezetimibe (ZETIA) 10 MG tablet TAKE (1) TABLET DAILY. Patient taking differently: Take 10 mg by mouth  daily. 03/17/21  Yes Larey Dresser, MD  FLUoxetine (PROZAC) 20 MG capsule TAKE (1) CAPSULE DAILY. Patient taking differently: Take 20 mg by mouth daily. 03/18/21  Yes [name], MD  gabapentin (NEURONTIN) 100 MG capsule Take 100 mg by mouth 3 (three) times daily.   Yes [provider]  hydrALAZINE (APRESOLINE) 50 MG tablet Take 1 tablet (50 mg total) by mouth 3 (three) times daily. 11/26/20  Yes Larey Dresser, MD  isosorbide mononitrate (IMDUR) 30 MG 24 hr tablet TAKE 1 TABLET EACH DAY. Patient taking differently: Take 30 mg by mouth daily. 04/06/21  Yes Larey Dresser, MD  levothyroxine (SYNTHROID) 100 MCG tablet Take 1 tablet (100 mcg total) by mouth daily before breakfast. 04/12/21  Yes Larey Dresser, MD  Multiple Vitamins-Minerals (MULTIVITAMIN ADULTS 50+) TABS Take 1 tablet by mouth daily in the afternoon.   Yes [provider]  nitroGLYCERIN (NITROSTAT) 0.4 MG SL tablet DISSOLVE 1 TABLET UNDER TONGUE AS NEEDED FOR CHEST PAIN,MAY REPEAT IN5 MINUTES FOR 2 DOSES. Patient taking differently: Place 0.4 mg under the tongue every 5 (five) minutes as needed for chest pain. 07/30/19  Yes Larey Dresser, MD  pantoprazole (PROTONIX) 40 MG tablet Take 1 tablet (40 mg total) by mouth 2 (two) times daily. 06/12/20  Yes Charlynne Cousins, MD  potassium chloride SA (KLOR-CON) 20 MEQ tablet TAKE 1 TABLET ONCE DAILY. Patient taking differently: Take 20 mEq by mouth daily. 03/17/21  Yes Larey Dresser, MD  tiotropium (SPIRIVA) 18 MCG inhalation capsule Place 18 mcg into inhaler and inhale daily as needed (for wheezing).   Yes [provider]  torsemide (DEMADEX) 20 MG tablet Take 4 tablets (80 mg total) by mouth every morning AND 2 tablets (40 mg total) every evening. Patient taking differently: Take 4 tablets (80 mg total) by mouth every morning as of 3/15. 01/14/21  Yes Larey Dresser, MD  metolazone (ZAROXOLYN) 2.5 MG tablet Take 1 tablet (2.5 mg total) by mouth as  directed. By HF Clinic Patient not taking: No sig reported 08/20/20   Larey Dresser, MD    Past Medical History: Past Medical History:  Diagnosis Date  . AAA (abdominal aortic aneurysm) ([place])   . Anemia   . Arthritis   . CAD (coronary artery disease)   . Cancer ([place])    Melanoma - Back  . Carotid artery stenosis   . CHF (congestive heart failure) ([place])   . COPD (chronic obstructive pulmonary disease) ([place])   . Cyst of kidney, acquired   . Depression   . Diabetes mellitus   . DVT (deep venous thrombosis) ([place])   . Dysrhythmia   . Fatty liver 2008  . GERD (gastroesophageal reflux disease)   . Hyperlipidemia   . Hypertension   . IBS (irritable bowel syndrome)   . LVF (left ventricular failure) ([place])   . Paroxysmal atrial fibrillation ([facility])   . Pneumonia Jan. 2014  . Shortness of breath dyspnea   . Thyroid disease   . Typical atrial flutter ([place])   . UTI (urinary tract infection) 05/2015   While in [place]   . Vitamin D deficiency     Past Surgical History: Past Surgical History:  Procedure Laterality Date  . ABDOMINAL AORTIC ANEURYSM REPAIR  2002  . ABDOMINAL AORTOGRAM W/LOWER EXTREMITY Left 08/10/2020   Procedure: ABDOMINAL AORTOGRAM W/LOWER EXTREMITY;  Surgeon: Waynetta Sandy, MD;  Location: [place] [facility];  Service: Cardiovascular;  Laterality: Left;  . BIOPSY  06/10/2020   Procedure: BIOPSY;  Surgeon: Ronnette Juniper, MD;  Location: [facility];  Service: Gastroenterology;;  . CARDIAC CATHETERIZATION    . CORONARY ANGIOPLASTY    . CORONARY ANGIOPLASTY WITH STENT PLACEMENT  2005  . CORONARY ARTERY BYPASS GRAFT  1992  . ESOPHAGEAL BRUSHING  06/10/2020   Procedure: ESOPHAGEAL BRUSHING;  Surgeon: Ronnette Juniper, MD;  Location: [facility];  Service: Gastroenterology;;  . ESOPHAGOGASTRODUODENOSCOPY N/A 06/10/2020   Procedure: ESOPHAGOGASTRODUODENOSCOPY (EGD);  Surgeon: Ronnette Juniper, MD;  Location: [facility];  Service: Gastroenterology;  Laterality: N/A;  . EYE  SURGERY     [place]  . HERNIA REPAIR    . INGUINAL HERNIA REPAIR Bilateral    w/mesh  . INSERTION OF MESH N/A 11/28/2017   Procedure: INSERTION OF MESH;  Surgeon: Donnie Mesa, MD;  Location: [facility];  Service: General;  Laterality: N/A;  GENERAL AND TAP BLOCK  . KNEE SURGERY Bilateral   . LAPAROSCOPIC INGUINAL HERNIA WITH UMBILICAL HERNIA Bilateral 11/28/2017   Procedure: LAPAROSCOPIC BILATERAL INGUINAL HERNIA REPAIR WITH MESH, UMBILICAL HERNIA REPAIR WITH MESH;  Surgeon: Donnie Mesa, MD;  Location: [facility];  Service: General;  Laterality: Bilateral;  GENERAL AND TAP BLOCK  . LEFT HEART CATHETERIZATION WITH CORONARY ANGIOGRAM N/A 10/30/2014   Procedure: LEFT HEART CATHETERIZATION WITH CORONARY ANGIOGRAM;  Surgeon: Larey Dresser, MD;  Location: [facility] CATH LAB;  Service: Cardiovascular;  Laterality: N/A;  . QUADRICEPS TENDON REPAIR Bilateral 08/2003   Archie Endo 05/10/2011  . UMBILICAL HERNIA REPAIR  11/28/2017   w/mesh    Family History: Family History  Problem Relation Age of Onset  . Colon cancer Father   . Coronary artery disease Brother   . Diabetes Brother   . Heart disease Brother   . Hyperlipidemia Brother   . Peripheral vascular disease Brother        Varicose Veins  . Throat cancer Brother        abdominal cancer?   . Diabetes Son   . Hyperlipidemia Son     Social History: Social History   Socioeconomic History  . Marital status: Married    Spouse name: Not on file  . Number of  children: Not on file  . Years of education: Not on file  . Highest education level: Not on file  Occupational History  . Occupation: Retired  Tobacco Use  . Smoking status: Never Smoker  . Smokeless tobacco: Never Used  . Tobacco comment: quit atleast 25 yrs ago, per pt  Vaping Use  . Vaping Use: Never used  Substance and Sexual Activity  . Alcohol use: Never    Comment: socially  . Drug use: Never  . Sexual activity: Not Currently  Other Topics Concern  . Not on file  Social  History Narrative   ** Merged History Encounter **       Pt lives in [place] with spouse. Retired from [employer].  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   Social Determinants of Health   Financial Resource Strain: Low Risk   . Difficulty of Paying Living Expenses: Not hard at all  Food Insecurity: No Food Insecurity  . Worried About Charity fundraiser in the Last Year: Never true  . Ran Out of Food in the Last Year: Never true  Transportation Needs: No Transportation Needs  . Lack of Transportation (Medical): No  . Lack of Transportation (Non-Medical): No  Physical Activity: Inactive  . Days of Exercise per Week: 0 days  . Minutes of Exercise per Session: 0 min  Stress: No Stress Concern Present  . Feeling of Stress : Not at all  Social Connections: Moderately Isolated  . Frequency of Communication with Friends and Family: More than three times a week  . Frequency of Social Gatherings with Friends and Family: More than three times a week  . Attends Religious Services: Never  . Active Member of Clubs or Organizations: No  . Attends Archivist Meetings: Never  . Marital Status: Married    Allergies:  Allergies  Allergen Reactions  . Metoprolol Shortness Of Breath, Palpitations and Other (See Comments)    Heart starts racing. Shallow breathing; pt states he also get skin irritation on his legs  . Metformin Hives and Swelling    On legs  . Metformin And Related Nausea And Vomiting    Objective:    Vital Signs:   Temp:  [98.4 F (36.9 C)-98.8 F (37.1 C)] 98.8 F (37.1 C) (04/19 1053) Pulse Rate:  [54-98] 74 (04/19 1415) Resp:  [18-25] 22 (04/19 1400) BP: (89-132)/(52-87) 102/59 (04/19 1415) SpO2:  [89 %-100 %] 93 % (04/19 1415) Weight:  [77.1 kg] 77.1 kg (04/19 0022)    Weight change: Filed Weights   04/13/21 0022  Weight: 77.1 kg    Intake/Output:  No intake or output data in the 24 hours ending  04/13/21 1449    Physical Exam    General:  Elderly fatigued appearing WM. Confused. No resp difficulty HEENT: normal Neck: supple. JVP elevated to jaw. Carotids 2+ bilat; no bruits. No lymphadenopathy or thyromegaly appreciated. Cor: PMI nondisplaced. Regular rate & rhythm. No rubs, gallops or murmurs. Lungs: decreased BS at the bases  Abdomen: soft, nontender, nondistended. No hepatosplenomegaly. No bruits or masses. Good bowel sounds. Extremities: no cyanosis, clubbing, rash, 1+ bilateral LEE both LEs wrapped  Neuro: confused. moves all 4 extremities w/o difficulty. Affect pleasant   Telemetry   Tele interpreting as Afib, but low voltage. Likely not picking up p-waves   EKG    NSR, 83 bpm, w/ nonspecific IVCD with LAD  Labs   Basic Metabolic Panel: Recent Labs  Lab 04/13/21 0124  NA 141  K 4.5  CL 106  CO2 23  GLUCOSE 144*  BUN 70*  CREATININE 2.31*  CALCIUM 8.9    Liver Function Tests: Recent Labs  Lab 04/13/21 0124  AST 40  ALT 33  ALKPHOS 92  BILITOT 1.1  PROT 6.8  ALBUMIN 3.3*   Recent Labs  Lab 04/13/21 0124  LIPASE 58*   No results for input(s): AMMONIA in the last 168 hours.  CBC: Recent Labs  Lab 04/13/21 0124  WBC 14.6*  NEUTROABS 12.5*  HGB 11.4*  HCT 36.6*  MCV 95.1  PLT 203    Cardiac Enzymes: No results for input(s): CKTOTAL, CKMB, CKMBINDEX, TROPONINI in the last 168 hours.  BNP: BNP (last 3 results) Recent Labs    06/03/20 0604 02/20/21 0313 04/13/21 1051  BNP 1,981.3* 1,833.3* >4,500.0*    ProBNP (last 3 results) No results for input(s): PROBNP in the last 8760 hours.   CBG: No results for input(s): GLUCAP in the last 168 hours.  Coagulation Studies: No results for input(s): LABPROT, INR in the last 72 hours.   Imaging   CT ABDOMEN PELVIS WO CONTRAST  Result Date: 04/13/2021 CLINICAL DATA:  Nausea, vomiting and diarrhea EXAM: CT ABDOMEN AND PELVIS WITHOUT CONTRAST TECHNIQUE: Multidetector CT imaging of  the abdomen and pelvis was performed following the standard protocol without IV contrast. COMPARISON:  05/31/2019 FINDINGS: LOWER CHEST: Small pleural effusions. HEPATOBILIARY: Normal hepatic contours. No intra- or extrahepatic biliary dilatation. There is cholelithiasis without acute inflammation. Small volume ascites in the upper abdomen. PANCREAS: Normal pancreas. No ductal dilatation or peripancreatic fluid collection. SPLEEN: Normal. ADRENALS/URINARY TRACT: The adrenal glands are normal. Multiple unchanged renal cysts. The largest on the right measures 4.2 cm in the largest on the left measures 6.6 cm. The urinary bladder is normal for degree of distention STOMACH/BOWEL: There is no hiatal hernia. Normal duodenal course and caliber. No small bowel dilatation or inflammation. No focal colonic abnormality. Normal appendix. VASCULAR/LYMPHATIC: There is calcific atherosclerosis of the abdominal aorta. Remote aortic graft repair. No lymphadenopathy. REPRODUCTIVE: There are calcifications within the normal-sized prostate. Symmetric seminal vesicles. MUSCULOSKELETAL. No bony spinal canal stenosis or focal osseous abnormality. OTHER: None. IMPRESSION: 1. No acute abnormality of the abdomen or pelvis. 2. Small volume ascites and small pleural effusions. 3. Cholelithiasis without acute inflammation. Electronically Signed   By: Ulyses Jarred M.D.   On: 04/13/2021 03:37   DG Tibia/Fibula Left  Result Date: 04/13/2021 CLINICAL DATA:  Leg ulcer EXAM: LEFT TIBIA AND FIBULA - 2 VIEW COMPARISON:  None. FINDINGS: There is no evidence of fracture or other focal bone lesions. No focal erosion or periosteal elevation. Extensive vascular calcifications. Mild diffuse soft tissue swelling. IMPRESSION: 1. No acute osseous abnormality in the left tibia or fibula. 2. Mild diffuse soft tissue swelling. 3. Extensive vascular calcifications. Electronically Signed   By: Davina Poke D.O.   On: 04/13/2021 11:04   DG Tibia/Fibula  Right  Result Date: 04/13/2021 CLINICAL DATA:  Leg ulcers.  Evaluate for osteomyelitis. EXAM: RIGHT TIBIA AND FIBULA - 2 VIEW COMPARISON:  Right knee radiographs 07/03/2020. Right tibia/fibula radiographs 09/15/2015. FINDINGS: No acute fracture is identified. The knee and ankle appear located. No interval osseous erosion is seen to indicate osteomyelitis. There is mild medial compartment joint space narrowing at the knee. Patellar and calcaneal enthesophytes are noted. There are surgical clips in the lower leg with atherosclerotic vascular calcifications and soft tissue swelling. IMPRESSION: No evidence of osteomyelitis. Electronically Signed   By:  Logan Bores M.D.   On: 04/13/2021 11:03   DG Chest Port 1 View  Result Date: 04/13/2021 CLINICAL DATA:  Hypoxia. EXAM: PORTABLE CHEST 1 VIEW COMPARISON:  02/20/2021. FINDINGS: Prior CABG. Cardiomegaly. Mild bilateral interstitial prominence and small bilateral pleural effusions. Findings suggest CHF. Mild bibasilar atelectasis. No pneumothorax. IMPRESSION: 1. Prior CABG. Cardiomegaly with mild bilateral interstitial prominence and small bilateral pleural effusions. Findings suggest CHF. 2.  Low lung volumes with bibasilar atelectasis. Electronically Signed   By: Marcello Moores  Register   On: 04/13/2021 11:05      Medications:     Current Medications:   Infusions: . heparin    . [START ON 04/15/2021] vancomycin         Assessment/Plan   1. Sepsis 2/2 Bilateral LE Cellulitis: bilateral LE wounds w/ recent poor compliance w/ wound clinic f/u. WBC 14K. AF. Plain films of LEs negative for osteo  - abx per [facility], on vanc + ceftriaxone   - Blood cultures pending  2. Acute on Chronic Systolic Hear Failure: Ischemic cardiomyopathy: EF 40-45% on 3/18 echo. Echo 8/20 with EF 45-50%, normal RV. Echo in 9/21 with EF 40-45%, mild mitral stenosis. Recent admit 2/22 for a/c CHF.  Repeat echo EF lower, 25-30%, RV ok. Cath declined due to age and lack of ischemic CP.  Now readmitted for a/c CHF w/ marked fluid overload and ? Low output. BNP >4,500. SCr 2.32 (baseline 1.6). Suspect ACS. HS trop 1,562>>1,945. Echo this admit EF 25-30% + RWMA (LV global hypokinesis with mild dyskinesis of the left  ventricular apex and disproportionately severe inferior/inferoseptal/inferolaeral hypokinesis). RV moderately reduced.  - No plans for LHC given advanced age and AKI  - Diuresis w/ IV Lasix 80 mg bid  - can Place PICC to check Co-ox and follow CVPs (not HD candidate). May need milrinone to help w/ diuresis - hold Jardiance w/ AKI - No Entresto/ spiro/ dig w/ AKI - not candidate for advanced therapies given advanced age  31. CAD/ NSTEMI: s/p CABG. Cath in 11/15 showed occluded LAD with left to right collaterals. The LAD was a relatively small vessel (super-dominant right). Managed medically. Lexiscan Cardiolite in 6/16 with infarction but no ischemia. Cardiolite 3/18 with infarction, no ischemia. EF dropped on recent echo 2/22 down to 25-30% however no plans for ischemic w/u given lack of CP and advanced age. Pt also declining invasive procedures. Now readmitted for a/c CHF + abdominal pain. HS trop 1,562>>1,945. EKG showed NSR, 83 bpm, w/ nonspecific IVCD with LAD. Echo w/ RWMAs as outlined above. He denies CP but also confused.  - No plans for LHC given advanced age and AKI  - continue medical therapy w/ IV heparin + statin.  - no  blocker w/ ? Low output and h/o bradycardia  4. AKI on Stage III CKD: baseline SCr ~1.6. 2.31 on admit, in setting of sepsis and ? Low output + fluid overload. UA negative  - diuresis w/ IV Lasix, may need short course of inotropes to help w/ diuresis  - follow BMP  5. Carotid stenosis: carotid duplex 2019 w/ 1-39% bilateral ICA stenosis. Followed by [facility]   6. PAD: Severe bilateral SFA disease on 8/21 peripheral angiogram, no revascularization options.  Leg ulcers now healed, no longer going to wound clinic. Minimal claudication. Cilostazol  recently discontinued 2/22 given CHF. 7. Hyperlipidemia:Continue atorvastatin 8. Paroxysmal Atrial fibrillation/flutter: rate controlled. Off  blocker due to bradycardia - Eliquis on hold. Continue IV heparin for NSTEMI  9. COPD: - followed  by pulmonology   10. 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 He has had resulting imbalance.  - He is no longer driving.  11. Hypertension:BP controlled on current regimen.  12. Hypothyroidism: continue levothyroxine  13. Pulmonary nodule: 1.3 cm RML nodule on 1/19 CT chest. It was also seen in 2014 =>slow growing. Dr Lake Bells has discussed with patient and wife and they have decided not to go forward with biopsy. They would not be interested in lung cancer treatment.  CT chest in 7/20 showed stable 1 cm RML nodule.   Length of Stay: 0  Lyda Jester, PA-C  04/13/2021, 2:49 PM  Advanced Heart Failure Team Pager [phone] (M-F; 7a - 5p)  Please contact [facility] for night-coverage after hours (4p -7a ) and weekends on [email or web address]  Patient seen with PA, agree with the above note.    He was admitted with progressive lower leg swelling, weeping, and redness.  Also with fatigue and lethargy. Had not been going to the wound clinic.  SBP 100s-110s, WBCs elevated.  Concern for sepsis syndrome.  Also noted to have CXR with pulmonary edema and marked volume overload.  Creatinine up to 2.3. HS-TnI 1562 => 1945.  BNP > 4500.   Echo was done today, EF 25-30% with moderately decreased RV systolic function, similar to prior in 2/22 but lower than 9/21 (40-45%).  General: NAD Neck: JVP 16 cm, no thyromegaly or thyroid nodule.  Lungs: Clear to auscultation bilaterally with normal respiratory effort. CV: Nondisplaced PMI.  Heart regular S1/S2, no S3/S4, no murmur.  2+ edema to knees.  No carotid bruit.  Unable to palpate pedal pulses.  Abdomen: Soft, nontender, no hepatosplenomegaly, no distention.  Skin: Erythema lower legs.  Neurologic: Drowsy but oriented to  person/place.   Psych: Normal affect. Extremities: No clubbing or cyanosis.  HEENT: Normal.   Suspect co-existing cellulitis/sepsis syndrome and acute on chronic systolic CHF with possible low output and marked volume overload.   Possible sepsis syndrome from lower extremity cellulitis, has not been compliant with wound clinic.  - Continue vancomycin/ceftriaxone.  - Blood cultures.  - Hold hydralazine/Imdur and Coreg with soft BP.   Acute on chronic systolic CHF, EF 96-29% on echo this admission (same as 2/22 but lower than 9/21).  He is markedly volume overloaded on exam, IVC dilated on echo.  BNP >4500.  CXR wet.  - I worry that he could be in a low output state.  Will place PICC (not HD candidate).  Send co-ox and follow CVP.  If co-ox low, will start milrinone gtt.  - Lasix 80 mg IV bid and follow response.    No chest pain, HS-TnI elevated but not a marked trend.  BNP markedly elevated and creatinine high.   - Heparin gtt for now.  - Continue statin, Zetia.  - ASA 81 - Avoid cath unless intractable CP/STEMI/marked HS-TnI rise => CKD stage IIIb with creatinine higher than prior, age and reluctance for invasive procedures.  - Will continue to cycle troponin.   Has history of PAF, rhythm difficult with baseline artifact.  May be in rate-controlled AF.  - Continue amiodarone 100 daily.  - heparin gtt for now. - Repeat ECG.   Loralie Champagne 04/13/2021 4:41 PM

## 2021-04-13 NOTE — ED Provider Notes (Signed)
[facility] DEPT Provider Note   CSN: [id number] Arrival date & time: 04/13/21  0000     History Chief Complaint  Patient presents with  . Abdominal Pain    Andrew [name] is a 87 y.o. male.  HPI     This is a 87 year old male with a history of AAA, CHF, COPD, diabetes, hypertension, hyperlipidemia, atrial fibrillation who presents with abdominal pain.  Patient reports acute onset of midline lower abdominal pain.  Describes it as pressure and nonradiating.  He states it is better after taking a Tylenol 3 at home.  No associated nausea, vomiting, diarrhea.  Last bowel movement was several days ago but he states that this is not abnormal for him.  Currently he rates his pain 8 out of 10.  Nothing seems to make it better or worse.  Denies back pain.  Past Medical History:  Diagnosis Date  . AAA (abdominal aortic aneurysm) ([place])   . Anemia   . Arthritis   . CAD (coronary artery disease)   . Cancer ([place])    Melanoma - Back  . Carotid artery stenosis   . CHF (congestive heart failure) ([place])   . COPD (chronic obstructive pulmonary disease) ([place])   . Cyst of kidney, acquired   . Depression   . Diabetes mellitus   . DVT (deep venous thrombosis) ([place])   . Dysrhythmia   . Fatty liver 2008  . GERD (gastroesophageal reflux disease)   . Hyperlipidemia   . Hypertension   . IBS (irritable bowel syndrome)   . LVF (left ventricular failure) ([place])   . Paroxysmal atrial fibrillation (H[facility])   . Pneumonia Jan. 2014  . Shortness of breath dyspnea   . Thyroid disease   . Typical atrial flutter ([place])   . UTI (urinary tract infection) 05/2015   While in [place]   . Vitamin D deficiency     Patient Active Problem List   Diagnosis Date Noted  . Acute respiratory failure with hypoxia ([place]) 02/20/2021  . COPD (chronic obstructive pulmonary disease) ([place]) 02/20/2021  . History of CVA (cerebrovascular accident)   . Acute respiratory failure with hypercapnia  ([place]) 06/02/2020  . Acute metabolic encephalopathy 77/82/4235  . CKD (chronic kidney disease), stage IV ([place]) 06/02/2020  . PAF (paroxysmal atrial fibrillation) ([place]) 06/02/2020  . Hypothyroidism 06/02/2020  . Chronic anticoagulation 06/02/2020  . Debility 06/01/2020  . Lumbar spondylosis 10/04/2018  . Chronic respiratory failure with hypoxia ([place]) 09/16/2018  . Pain of right thumb 06/19/2018  . Bilateral recurrent inguinal hernias 11/28/2017  . Carotid artery stenosis 01/12/2016  . Chronic systolic CHF (congestive heart failure) ([place]) 12/02/2015  . Acute on chronic combined systolic and diastolic CHF (congestive heart failure) ([place]) 11/11/2015  . 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 PVD (peripheral vascular disease) ([place])   . Hereditary and idiopathic peripheral neuropathy 10/02/2015  . Peripelvic (lymphatic) cyst   . Pernicious anemia 07/15/2015  . Bilateral renal cysts 07/12/2015  . Lung nodule, 78mm RML CT 07/12/15 07/12/2015  . Chronic kidney disease, stage 3b ([place]) 06/14/2015  . Paroxysmal atrial fibrillation ([place]) 05/28/2015  . AAA (abdominal aortic aneurysm) without rupture ([place]) 09/30/2014  . Cardiomyopathy, ischemic 05/23/2014  . DM (diabetes mellitus), type 2 with renal complications ([place]) 36/14/4315  . CAD (coronary artery disease) 11/09/2010  . Depression, major, single episode, complete remission ([place]) 03/18/2010  . Hypothyroidism 02/03/2009  . Osteoarthritis 05/27/2008  . Hyperlipidemia associated with type 2 diabetes mellitus (  [place]) 06/14/2007  . Hypertension associated with diabetes ([place]) 06/14/2007  . GERD 06/14/2007    Past Surgical History:  Procedure Laterality Date  . ABDOMINAL AORTIC ANEURYSM REPAIR  2002  . ABDOMINAL AORTOGRAM W/LOWER EXTREMITY Left 08/10/2020   Procedure: ABDOMINAL AORTOGRAM W/LOWER EXTREMITY;  Surgeon: Waynetta Sandy, MD;  Location: [facility];  Service: Cardiovascular;  Laterality: Left;   . BIOPSY  06/10/2020   Procedure: BIOPSY;  Surgeon: Ronnette Juniper, MD;  Location: [facility];  Service: Gastroenterology;;  . CARDIAC CATHETERIZATION    . CORONARY ANGIOPLASTY    . CORONARY ANGIOPLASTY WITH STENT PLACEMENT  2005  . CORONARY ARTERY BYPASS GRAFT  1992  . ESOPHAGEAL BRUSHING  06/10/2020   Procedure: ESOPHAGEAL BRUSHING;  Surgeon: Ronnette Juniper, MD;  Location: [facility];  Service: Gastroenterology;;  . ESOPHAGOGASTRODUODENOSCOPY N/A 06/10/2020   Procedure: ESOPHAGOGASTRODUODENOSCOPY (EGD);  Surgeon: Ronnette Juniper, MD;  Location: [facility];  Service: Gastroenterology;  Laterality: N/A;  . EYE SURGERY     [place]  . HERNIA REPAIR    . INGUINAL HERNIA REPAIR Bilateral    w/mesh  . INSERTION OF MESH N/A 11/28/2017   Procedure: INSERTION OF MESH;  Surgeon: Donnie Mesa, MD;  Location: [facility];  Service: General;  Laterality: N/A;  GENERAL AND TAP BLOCK  . KNEE SURGERY Bilateral   . LAPAROSCOPIC INGUINAL HERNIA WITH UMBILICAL HERNIA Bilateral 11/28/2017   Procedure: LAPAROSCOPIC BILATERAL INGUINAL HERNIA REPAIR WITH MESH, UMBILICAL HERNIA REPAIR WITH MESH;  Surgeon: Donnie Mesa, MD;  Location: [facility];  Service: General;  Laterality: Bilateral;  GENERAL AND TAP BLOCK  . LEFT HEART CATHETERIZATION WITH CORONARY ANGIOGRAM N/A 10/30/2014   Procedure: LEFT HEART CATHETERIZATION WITH CORONARY ANGIOGRAM;  Surgeon: Larey Dresser, MD;  Location: [facility] C[facility];  Service: Cardiovascular;  Laterality: N/A;  . QUADRICEPS TENDON REPAIR Bilateral 08/2003   Archie Endo 05/10/2011  . UMBILICAL HERNIA REPAIR  11/28/2017   w/mesh       Family History  Problem Relation Age of Onset  . Colon cancer Father   . Coronary artery disease Brother   . Diabetes Brother   . Heart disease Brother   . Hyperlipidemia Brother   . Peripheral vascular disease Brother        Varicose Veins  . Throat cancer Brother        abdominal cancer?   . Diabetes Son   . Hyperlipidemia Son     Social History    Tobacco Use  . Smoking status: Never Smoker  . Smokeless tobacco: Never Used  . Tobacco comment: quit atleast 25 yrs ago, per pt  Vaping Use  . Vaping Use: Never used  Substance Use Topics  . Alcohol use: Never    Comment: socially  . Drug use: Never    Home Medications Prior to Admission medications   Medication Sig Start Date End Date Taking? Authorizing Provider  acetaminophen (TYLENOL) 500 MG tablet Take 2 tablets (1,000 mg total) by mouth every 8 (eight) hours as needed. 06/01/20   [name], MD  amiodarone (PACERONE) 200 MG tablet Take 0.5 tablets (100 mg total) by mouth daily. 11/12/20   Larey Dresser, MD  atorvastatin (LIPITOR) 80 MG tablet Take 1 tablet (80 mg total) by mouth daily. Patient taking differently: Take 80 mg by mouth every evening. 11/26/20   Larey Dresser, MD  ELIQUIS 2.5 MG TABS tablet TAKE 1 TABLET BY MOUTH TWICE DAILY. 01/12/21   Larey Dresser, MD  empagliflozin (JARDIANCE) 10 MG  TABS tablet Take 1 tablet (10 mg total) by mouth daily before breakfast. 01/14/21   Larey Dresser, MD  ezetimibe (ZETIA) 10 MG tablet TAKE (1) TABLET DAILY. 03/17/21   Larey Dresser, MD  FLUoxetine (PROZAC) 20 MG capsule TAKE (1) CAPSULE DAILY. 03/18/21   [name], MD  gabapentin (NEURONTIN) 100 MG capsule Take 100 mg by mouth 3 (three) times daily.    [provider]  hydrALAZINE (APRESOLINE) 50 MG tablet Take 1 tablet (50 mg total) by mouth 3 (three) times daily. 11/26/20   Larey Dresser, MD  isosorbide mononitrate (IMDUR) 30 MG 24 hr tablet TAKE 1 TABLET EACH DAY. 04/06/21   Larey Dresser, MD  levothyroxine (SYNTHROID) 100 MCG tablet Take 1 tablet (100 mcg total) by mouth daily before breakfast. 04/12/21   Larey Dresser, MD  metolazone (ZAROXOLYN) 2.5 MG tablet Take 1 tablet (2.5 mg total) by mouth as directed. By HF Clinic Patient not taking: No sig reported 08/20/20   Larey Dresser, MD  Multiple Vitamins-Minerals (MULTIVITAMIN ADULTS 50+)  TABS Take 1 tablet by mouth daily in the afternoon.    [provider]  nitroGLYCERIN (NITROSTAT) 0.4 MG SL tablet DISSOLVE 1 TABLET UNDER TONGUE AS NEEDED FOR CHEST PAIN,MAY REPEAT IN5 MINUTES FOR 2 DOSES. Patient not taking: No sig reported 07/30/19   Larey Dresser, MD  pantoprazole (PROTONIX) 40 MG tablet Take 1 tablet (40 mg total) by mouth 2 (two) times daily. 06/12/20   Charlynne Cousins, MD  potassium chloride SA (KLOR-CON) 20 MEQ tablet TAKE 1 TABLET ONCE DAILY. 03/17/21   Larey Dresser, MD  tiotropium (SPIRIVA) 18 MCG inhalation capsule Place 18 mcg into inhaler and inhale daily as needed (for wheezing).    [provider]  torsemide (DEMADEX) 20 MG tablet Take 4 tablets (80 mg total) by mouth every morning AND 2 tablets (40 mg total) every evening. Patient taking differently: Take 4 tablets (80 mg total) by mouth every morning as of 3/15. 01/14/21   Larey Dresser, MD    Allergies    Metoprolol, Metformin, and Metformin and related  Review of Systems   Review of Systems  Constitutional: Negative for fever.  Respiratory: Negative for shortness of breath.   Cardiovascular: Negative for chest pain.  Gastrointestinal: Positive for abdominal pain. Negative for constipation, diarrhea, nausea and vomiting.  Genitourinary: Negative for dysuria.  All other systems reviewed and are negative.   Physical Exam Updated Vital Signs BP 114/63   Pulse 65   Temp 98.4 F (36.9 C) (Oral)   Resp 20   Ht 1.702 m (5\' 7" )   Wt 77.1 kg   SpO2 91%   BMI 26.63 kg/m   Physical Exam Vitals and nursing note reviewed.  Constitutional:      Appearance: He is well-developed. He is not ill-appearing.  HENT:     Head: Normocephalic and atraumatic.     Mouth/Throat:     Mouth: Mucous membranes are moist.  Eyes:     Pupils: Pupils are equal, round, and reactive to light.  Cardiovascular:     Rate and Rhythm: Normal rate and regular rhythm.     Heart sounds: Normal heart  sounds. No murmur heard.   Pulmonary:     Effort: Pulmonary effort is normal. No respiratory distress.     Breath sounds: Normal breath sounds. No wheezing.  Abdominal:     General: Bowel sounds are normal.     Palpations: Abdomen is  soft.     Tenderness: There is abdominal tenderness in the suprapubic area. There is no guarding or rebound.     Comments: Abdominal scarring noted  Musculoskeletal:     Cervical back: Neck supple.  Lymphadenopathy:     Cervical: No cervical adenopathy.  Skin:    General: Skin is warm and dry.     Comments: Erythematous bilateral lower extremities with chronic wounds right lower extremity  Neurological:     Mental Status: He is alert and oriented to person, place, and time.  Psychiatric:        Mood and Affect: Mood normal.     ED Results / Procedures / Treatments   Labs (all labs ordered are listed, but only abnormal results are displayed) Labs Reviewed  CBC WITH DIFFERENTIAL/PLATELET - Abnormal; Notable for the following components:      Result Value   WBC 14.6 (*)    RBC 3.85 (*)    Hemoglobin 11.4 (*)    HCT 36.6 (*)    Neutro Abs 12.5 (*)    Monocytes Absolute 1.3 (*)    Abs Immature Granulocytes 0.11 (*)    All other components within normal limits  COMPREHENSIVE METABOLIC PANEL - Abnormal; Notable for the following components:   Glucose, Bld 144 (*)    BUN 70 (*)    Creatinine, Ser 2.31 (*)    Albumin 3.3 (*)    GFR, Estimated 26 (*)    All other components within normal limits  LIPASE, BLOOD - Abnormal; Notable for the following components:   Lipase 58 (*)    All other components within normal limits  URINALYSIS, ROUTINE W REFLEX MICROSCOPIC - Abnormal; Notable for the following components:   Glucose, UA 50 (*)    Protein, ur 30 (*)    All other components within normal limits    EKG None  Radiology CT ABDOMEN PELVIS WO CONTRAST  Result Date: 04/13/2021 CLINICAL DATA:  Nausea, vomiting and diarrhea EXAM: CT ABDOMEN AND  PELVIS WITHOUT CONTRAST TECHNIQUE: Multidetector CT imaging of the abdomen and pelvis was performed following the standard protocol without IV contrast. COMPARISON:  05/31/2019 FINDINGS: LOWER CHEST: Small pleural effusions. HEPATOBILIARY: Normal hepatic contours. No intra- or extrahepatic biliary dilatation. There is cholelithiasis without acute inflammation. Small volume ascites in the upper abdomen. PANCREAS: Normal pancreas. No ductal dilatation or peripancreatic fluid collection. SPLEEN: Normal. ADRENALS/URINARY TRACT: The adrenal glands are normal. Multiple unchanged renal cysts. The largest on the right measures 4.2 cm in the largest on the left measures 6.6 cm. The urinary bladder is normal for degree of distention STOMACH/BOWEL: There is no hiatal hernia. Normal duodenal course and caliber. No small bowel dilatation or inflammation. No focal colonic abnormality. Normal appendix. VASCULAR/LYMPHATIC: There is calcific atherosclerosis of the abdominal aorta. Remote aortic graft repair. No lymphadenopathy. REPRODUCTIVE: There are calcifications within the normal-sized prostate. Symmetric seminal vesicles. MUSCULOSKELETAL. No bony spinal canal stenosis or focal osseous abnormality. OTHER: None. IMPRESSION: 1. No acute abnormality of the abdomen or pelvis. 2. Small volume ascites and small pleural effusions. 3. Cholelithiasis without acute inflammation. Electronically Signed   By: Ulyses Jarred M.D.   On: 04/13/2021 03:37    Procedures Procedures   Medications Ordered in ED Medications  morphine 4 MG/ML injection 4 mg (4 mg Intravenous Given 04/13/21 0120)  ondansetron (ZOFRAN) injection 4 mg (4 mg Intravenous Given 04/13/21 0120)    ED Course  I have reviewed the triage vital signs and the nursing notes.  Pertinent labs &  imaging results that were available during my care of the patient were reviewed by me and considered in my medical decision making (see chart for details).  Clinical Course as of  04/13/21 0637  Tue Apr 13, 2021  0633 Patient presents with lower abdominal discomfort.  He did improve with some pain medication at home.  His work-up is largely reassuring.  Urinalysis without UTI.  CT scan does not show any obvious intra-abdominal pathology.  Patient was given 1 dose of pain medication.  Since that time he has been fairly somnolent.  He is arousable but falls back to sleep quite quickly.  Given his renal function, he may have some difficulty metabolizing.  Will monitor closely.  When he is able to wake up and eat and is back to his baseline, will plan for discharge.  He does report his wife can drive and pick him up. [CH]    Clinical Course User Index [CH] Criag Wicklund, Barbette Hair, MD   MDM Rules/Calculators/A&P                          Patient presents with lower abdominal discomfort.  Symptom improved after taking medications at home.  He is overall nontoxic.  He is on his baseline oxygen requirement.  He has some distention of the abdomen but it is soft and only mildly tender.  No significant point tenderness.  Considerations include but not limited to UTI, appendicitis, less likely obstructive process.  Patient was given 1 dose of pain medication.  Labs obtained.  Appear to be close to patient's baseline.  Creatinine is 2.3.  This precludes CT with contrast.  We will get a noncontrasted scan.  Noncontrasted scan shows some small volume ascites and pleural effusions.  Lipase is slightly elevated at 58 but have lower suspicion for pancreatitis given location of pain.  Patient does have leukocytosis to 14.6.  Urinalysis without evidence of UTI.  See clinical course above.  Attempted to recheck the patient and have him eat.  However, he was fairly somnolent.  He is arousable and answers questions appropriately when he awakens but falls back asleep quickly.  Question whether this may be related to his pain medication.  Will monitor closely.   Final Clinical Impression(s) / ED  Diagnoses Final diagnoses:  Lower abdominal pain    Rx / DC Orders ED Discharge Orders    None       Merryl Hacker, MD 04/13/21 [phone]

## 2021-04-13 NOTE — ED Notes (Signed)
Pt departed the dept via [place].

## 2021-04-13 NOTE — ED Notes (Signed)
Per nightshift RN, Hildred Alamin, pt received 4mg  morphine IV. Per night ED provider and nightshift RN, "once pt is more awake, he is to be discharged." Will continue to monitor.

## 2021-04-13 NOTE — ED Notes (Signed)
Spoke with Marcello Moores at [facility] for pt transport to [facility]. [facility] eta 1430.

## 2021-04-13 NOTE — Consult Note (Addendum)
Cardiology Consultation:   Patient ID: Andrew [name] MRN: [id number]; DOB: 07-22-1928  Admit date: 04/13/2021 Date of Consult: 04/13/2021  PCP:  [name], [place]  Cardiologist:  Loralie Champagne, MD 01/14/2021 Advanced Practice Provider:  No care team member to display Electrophysiologist:  None  Advanced Heart Failure Clinic:  Loralie Champagne, MD        Patient Profile:   Andrew [name] is a 87 y.o. male with a hx of CAD s/p CABG, chronic systolic HF LVEF 40-98%, ischemic cardiomyopathy with primarily diastolic CHF, bronchiectasis, COPD, CKD III, Afib/flutter on amio & Eliquis, CVA 2016, HTN, HLD, hypothyroid, PAD w/ severe bilat SFA dz 07/2020, leg wounds that had healed by 12/2020, who is being seen today for the evaluation of CHF +/- CGS at the request of Dr Marylyn Ishihara.  History of Present Illness:   Andrew [name] was doing well when seen by Dr [name] 01/20, wt 174 lbs Admitted 02/26-03/01/2021 with acute resp failure 2nd CHF, diuresed 6 L, d/c on torsemide 80 mg am, 40 mg pm, wt 77.6 kg at d/c, EF 25-30% Office visit 03/15 w/ B. Simmons, PAC, wt 171 lbs, HR 44, Coreg dose changed to 3.125 mg bid for bradycardia, Pletal d/c'd due to CHF.  Pt brought to the ER early this am for abdominal pain, in lower abdomen, pressure, improved w/ Tylenol #3 taken at home. WBCs 14.6 but no obvious source of infection. Pt given morphine 4 mg, became somnolent. Initial plan was to d/c, but not able to 2nd AMS. Pt noted to have skin lesions, concern for delirium, 2nd infection. Also noted to have hypoxia.   BNP and trop checked and are elevated, Cards asked to see.   Andrew [name] is alert and oriented x 2.  He does not know how long his leg wounds have been there.   He does not know his weight or if he has gained any. He denies increased DOE but is belly-breathing in the ER. He denies orthopnea or PND.  He denies chest pain.   He says he can walk room-room at  home, but appears very weak and has multiple leg wounds. He says he has pain in his legs at times.   Abd pain has improved w/ Rx.   Past Medical History:  Diagnosis Date  . AAA (abdominal aortic aneurysm) ([place])   . Anemia   . Arthritis   . CAD (coronary artery disease)   . Cancer ([place])    Melanoma - Back  . Carotid artery stenosis   . CHF (congestive heart failure) ([place])   . COPD (chronic obstructive pulmonary disease) ([place])   . Cyst of kidney, acquired   . Depression   . Diabetes mellitus   . DVT (deep venous thrombosis) ([place])   . Dysrhythmia   . Fatty liver 2008  . GERD (gastroesophageal reflux disease)   . Hyperlipidemia   . Hypertension   . IBS (irritable bowel syndrome)   . LVF (left ventricular failure) ([place])   . Paroxysmal atrial fibrillation ([facility])   . Pneumonia Jan. 2014  . Shortness of breath dyspnea   . Thyroid disease   . Typical atrial flutter ([place])   . UTI (urinary tract infection) 05/2015   While in [place]   . Vitamin D deficiency     Past Surgical History:  Procedure Laterality Date  . ABDOMINAL AORTIC ANEURYSM REPAIR  2002  . ABDOMINAL AORTOGRAM W/LOWER EXTREMITY Left 08/10/2020   Procedure:  ABDOMINAL AORTOGRAM W/LOWER EXTREMITY;  Surgeon: Waynetta Sandy, MD;  Location: [facility];  Service: Cardiovascular;  Laterality: Left;  . BIOPSY  06/10/2020   Procedure: BIOPSY;  Surgeon: Ronnette Juniper, MD;  Location: [facility];  Service: Gastroenterology;;  . CARDIAC CATHETERIZATION    . CORONARY ANGIOPLASTY    . CORONARY ANGIOPLASTY WITH STENT PLACEMENT  2005  . CORONARY ARTERY BYPASS GRAFT  1992  . ESOPHAGEAL BRUSHING  06/10/2020   Procedure: ESOPHAGEAL BRUSHING;  Surgeon: Ronnette Juniper, MD;  Location: [facility];  Service: Gastroenterology;;  . ESOPHAGOGASTRODUODENOSCOPY N/A 06/10/2020   Procedure: ESOPHAGOGASTRODUODENOSCOPY (EGD);  Surgeon: Ronnette Juniper, MD;  Location: [facility];  Service: Gastroenterology;  Laterality: N/A;  . EYE  SURGERY     [place]  . HERNIA REPAIR    . INGUINAL HERNIA REPAIR Bilateral    w/mesh  . INSERTION OF MESH N/A 11/28/2017   Procedure: INSERTION OF MESH;  Surgeon: Donnie Mesa, MD;  Location: [facility];  Service: General;  Laterality: N/A;  GENERAL AND TAP BLOCK  . KNEE SURGERY Bilateral   . LAPAROSCOPIC INGUINAL HERNIA WITH UMBILICAL HERNIA Bilateral 11/28/2017   Procedure: LAPAROSCOPIC BILATERAL INGUINAL HERNIA REPAIR WITH MESH, UMBILICAL HERNIA REPAIR WITH MESH;  Surgeon: Donnie Mesa, MD;  Location: [facility];  Service: General;  Laterality: Bilateral;  GENERAL AND TAP BLOCK  . LEFT HEART CATHETERIZATION WITH CORONARY ANGIOGRAM N/A 10/30/2014   Procedure: LEFT HEART CATHETERIZATION WITH CORONARY ANGIOGRAM;  Surgeon: Larey Dresser, MD;  Location: [facility];  Service: Cardiovascular;  Laterality: N/A;  . QUADRICEPS TENDON REPAIR Bilateral 08/2003   Archie Endo 05/10/2011  . UMBILICAL HERNIA REPAIR  11/28/2017   w/mesh     Home Medications:  Prior to Admission medications   Medication Sig Start Date End Date Taking? Authorizing Provider  acetaminophen (TYLENOL) 500 MG tablet Take 2 tablets (1,000 mg total) by mouth every 8 (eight) hours as needed. 06/01/20  Yes [name], MD  amiodarone (PACERONE) 200 MG tablet Take 0.5 tablets (100 mg total) by mouth daily. 11/12/20  Yes Larey Dresser, MD  atorvastatin (LIPITOR) 80 MG tablet Take 1 tablet (80 mg total) by mouth daily. Patient taking differently: Take 80 mg by mouth every evening. 11/26/20  Yes Larey Dresser, MD  carvedilol (COREG) 6.25 MG tablet Take 6.25 mg by mouth 2 (two) times daily with a meal.   Yes [provider]  ELIQUIS 2.5 MG TABS tablet TAKE 1 TABLET BY MOUTH TWICE DAILY. Patient taking differently: Take 2.5 mg by mouth 2 (two) times daily. 01/12/21  Yes Larey Dresser, MD  empagliflozin (JARDIANCE) 10 MG TABS tablet Take 1 tablet (10 mg total) by mouth daily before breakfast. 01/14/21  Yes Larey Dresser, MD   ezetimibe (ZETIA) 10 MG tablet TAKE (1) TABLET DAILY. Patient taking differently: Take 10 mg by mouth daily. 03/17/21  Yes Larey Dresser, MD  FLUoxetine (PROZAC) 20 MG capsule TAKE (1) CAPSULE DAILY. Patient taking differently: Take 20 mg by mouth daily. 03/18/21  Yes [name], MD  gabapentin (NEURONTIN) 100 MG capsule Take 100 mg by mouth 3 (three) times daily.   Yes [provider]  hydrALAZINE (APRESOLINE) 50 MG tablet Take 1 tablet (50 mg total) by mouth 3 (three) times daily. 11/26/20  Yes Larey Dresser, MD  isosorbide mononitrate (IMDUR) 30 MG 24 hr tablet TAKE 1 TABLET EACH DAY. Patient taking differently: Take 30 mg by mouth daily. 04/06/21  Yes Larey Dresser, MD  levothyroxine (SYNTHROID) 100 MCG tablet Take 1 tablet (100 mcg total) by mouth daily before breakfast. 04/12/21  Yes Larey Dresser, MD  Multiple Vitamins-Minerals (MULTIVITAMIN ADULTS 50+) TABS Take 1 tablet by mouth daily in the afternoon.   Yes [provider]  nitroGLYCERIN (NITROSTAT) 0.4 MG SL tablet DISSOLVE 1 TABLET UNDER TONGUE AS NEEDED FOR CHEST PAIN,MAY REPEAT IN5 MINUTES FOR 2 DOSES. Patient taking differently: Place 0.4 mg under the tongue every 5 (five) minutes as needed for chest pain. 07/30/19  Yes Larey Dresser, MD  pantoprazole (PROTONIX) 40 MG tablet Take 1 tablet (40 mg total) by mouth 2 (two) times daily. 06/12/20  Yes Charlynne Cousins, MD  potassium chloride SA (KLOR-CON) 20 MEQ tablet TAKE 1 TABLET ONCE DAILY. Patient taking differently: Take 20 mEq by mouth daily. 03/17/21  Yes Larey Dresser, MD  tiotropium (SPIRIVA) 18 MCG inhalation capsule Place 18 mcg into inhaler and inhale daily as needed (for wheezing).   Yes [provider]  torsemide (DEMADEX) 20 MG tablet Take 4 tablets (80 mg total) by mouth every morning AND 2 tablets (40 mg total) every evening. Patient taking differently: Take 4 tablets (80 mg total) by mouth every morning as of 3/15. 01/14/21   Yes Larey Dresser, MD  metolazone (ZAROXOLYN) 2.5 MG tablet Take 1 tablet (2.5 mg total) by mouth as directed. By HF Clinic Patient not taking: No sig reported 08/20/20   Larey Dresser, MD    Inpatient Medications: Scheduled Meds:  Continuous Infusions:  PRN Meds:   Allergies:    Allergies  Allergen Reactions  . Metoprolol Shortness Of Breath, Palpitations and Other (See Comments)    Heart starts racing. Shallow breathing; pt states he also get skin irritation on his legs  . Metformin Hives and Swelling    On legs  . Metformin And Related Nausea And Vomiting    Social History:   Social History   Socioeconomic History  . Marital status: Married    Spouse name: Not on file  . Number of children: Not on file  . Years of education: Not on file  . Highest education level: Not on file  Occupational History  . Occupation: Retired  Tobacco Use  . Smoking status: Never Smoker  . Smokeless tobacco: Never Used  . Tobacco comment: quit atleast 25 yrs ago, per pt  Vaping Use  . Vaping Use: Never used  Substance and Sexual Activity  . Alcohol use: Never    Comment: socially  . Drug use: Never  . Sexual activity: Not Currently  Other Topics Concern  . Not on file  Social History Narrative   ** Merged History Encounter **       Pt lives in [place] with spouse. Retired from [employer].  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   Social Determinants of Health   Financial Resource Strain: Low Risk   . Difficulty of Paying Living Expenses: Not hard at all  Food Insecurity: No Food Insecurity  . Worried About Charity fundraiser in the Last Year: Never true  . Ran Out of Food in the Last Year: Never true  Transportation Needs: No Transportation Needs  . Lack of Transportation (Medical): No  . Lack of Transportation (Non-Medical): No  Physical Activity: Inactive  . Days of Exercise per Week: 0 days  . Minutes of Exercise  per Session: 0 min  Stress: No Stress Concern Present  . Feeling of Stress :  Not at all  Social Connections: Moderately Isolated  . Frequency of Communication with Friends and Family: More than three times a week  . Frequency of Social Gatherings with Friends and Family: More than three times a week  . Attends Religious Services: Never  . Active Member of Clubs or Organizations: No  . Attends Archivist Meetings: Never  . Marital Status: Married  Human resources officer Violence: Not At Risk  . Fear of Current or Ex-Partner: No  . Emotionally Abused: No  . Physically Abused: No  . Sexually Abused: No    Family History:   Family History  Problem Relation Age of Onset  . Colon cancer Father   . Coronary artery disease Brother   . Diabetes Brother   . Heart disease Brother   . Hyperlipidemia Brother   . Peripheral vascular disease Brother        Varicose Veins  . Throat cancer Brother        abdominal cancer?   . Diabetes Son   . Hyperlipidemia Son     Family Status  Relation Name Status  . Father  Deceased  . Brother  Deceased  . Mother  Deceased  . MGF  Deceased  . MGM  Deceased  . PGF  Deceased  . PGM  Deceased  . Son  (Not Specified)    ROS:  Please see the history of present illness.  All other ROS reviewed and negative.     Physical Exam/Data:   Vitals:   04/13/21 1230 04/13/21 1315 04/13/21 1345 04/13/21 1400  BP: 101/76 (!) 117/56 106/67 96/70  Pulse: 73 72  67  Resp: 20 19 (!) 21 (!) 22  Temp:      TempSrc:      SpO2: 92% 92%  (!) 89%  Weight:      Height:       No intake or output data in the 24 hours ending 04/13/21 1410 Last 3 Weights 04/13/2021 03/09/2021 02/23/2021  Weight (lbs) 170 lb 171 lb 171 lb 1.6 oz  Weight (kg) 77.111 kg 77.565 kg 77.61 kg     Body mass index is 26.63 kg/m.  General:  Well nourished, well developed, frail, elderly male, in no acute distress HEENT: normal Lymph: no adenopathy Neck: JVD 10-11 cm Endocrine:  No  thryomegaly Vascular: No carotid bruits; radial pulses 2+ bilaterally, not able to get DP/PT pulses and cap refill is delayed Cardiac:  normal S1, S2; RRR; no murmur Lungs:  clear anteriorly, no wheezing, rhonchi or rales  Abd: soft, nontender, no hepatomegaly  Ext: 1+ LE edema Musculoskeletal:  No deformities, BUE and BLE strength weak but equal Skin: warm and dry, both LE have multiple wounds, some draining clear fluid, +LE erythema also Neuro:  CNs 2-12 intact, no focal abnormalities noted Psych:  Normal affect   EKG:  The EKG was personally reviewed and demonstrates:  SR, LBBB w/ QRS duration 158 ms, decreased voltage, QT/QTc 504/582, slightly different from 03/22 ECG,  Telemetry:  Telemetry was personally reviewed and demonstrates:  SR  Relevant CV Studies: ECHO: pending  ECHO:   02/21/2021 1. Global hypokinesis with akinesis of the inferolateral walls; overall  severe LV dysfunction.  2. Left ventricular ejection fraction, by estimation, is 25 to 30%. The  left ventricle has severely decreased function. The left ventricle  demonstrates regional wall motion abnormalities (see scoring  diagram/findings for description). The left  ventricular internal cavity size was mildly dilated. Left ventricular  diastolic parameters  are indeterminate. Elevated left atrial pressure.  3. Right ventricular systolic function is normal. The right ventricular  size is normal.  4. Left atrial size was severely dilated.  5. The mitral valve is normal in structure. Mild mitral valve  regurgitation. No evidence of mitral stenosis. Moderate mitral annular  calcification.  6. The aortic valve is tricuspid. Aortic valve regurgitation is not  visualized. No aortic stenosis is present.  7. The inferior vena cava is dilated in size with <50% respiratory  variability, suggesting right atrial pressure of 15 mmHg.   ABI: 09/17/2020 ABI Findings:   +---------+------------------+-----+----------+--------+  Right  Rt Pressure (mmHg)IndexWaveform Comment   +---------+------------------+-----+----------+--------+  Brachial 119                      +---------+------------------+-----+----------+--------+  PTA   >250       2.10 monophasic      +---------+------------------+-----+----------+--------+  DP    >252       2.12 monophasic      +---------+------------------+-----+----------+--------+  Great Toe172        1.45 Abnormal       +---------+------------------+-----+----------+--------+   +---------+------------------+-----+----------+-----------+  Left   Lt Pressure (mmHg)IndexWaveform Comment    +---------+------------------+-----+----------+-----------+  Brachial 118                        +---------+------------------+-----+----------+-----------+  PTA   87        0.73 monophasic        +---------+------------------+-----+----------+-----------+  DP    >251       2.11 monophasic? hyperemic  +---------+------------------+-----+----------+-----------+  Great Toe94        0.79 Normal          +---------+------------------+-----+----------+-----------+   +-------+-----------+-----------+------------+------------+  ABI/TBIToday's ABIToday's TBIPrevious ABIPrevious TBI  +-------+-----------+-----------+------------+------------+  Right [place]     1.45    1.12 1.12  1.12      +-------+-----------+-----------+------------+------------+  Left  [place]     0.79    [place]     0.97      +-------+-----------+-----------+------------+------------+  Summary:  Right: Resting right ankle-brachial index indicates noncompressible right  lower extremity arteries. TBIs are unreliable.   Left: Resting left ankle-brachial index  indicates noncompressible left  lower extremity arteries. The left toe-brachial index is normal.   CARDIAC CATH:  10/30/2014 Procedural Findings: Hemodynamics: AO 118/53 LV 118/11  Coronary angiography: Coronary dominance: right  Left main: Arises from the right cusp in very close proximity to the ostium of the RCA.  The left main is a long, tortuous vessel.  It is heavily calcified at the ostium with probably around 40-50% ostial stenosis.  There is a small atrial branch that arises from the right cusp in close proximity to the left main ostium.  This branch has 50-60% ostial/proximal stenosis.  Left circumflex (LCx): This vessel is relatively small and gives rise to several small obtuse marginal vessels.  There are mild to moderate diffuse luminal irregularities.    Left anterior descending (LAD): The LAD is occluded proximally.  The mid to distal LAD fills by collaterals from the PDA.  The LAD is a relatively small vessel also given the large RCA.   Right coronary artery (RCA): The RCA was a large, super-dominant vessel.  The proximal to mid RCA is severely ectatic.  Mid RCA stent patent with about 50% in-stent restenosis and good flow down the vessel.  Diffuse mid to moderate disease throughout the large RCA in the 30-40% stenosis range.  50% ostial PDA. PDA wraps around the apex.  SVG-PLV is patent, supplying a relatively small PLV branch.  30% stenosis at the ostium of the graft.  The right internal mammary artery graft to the PDA is known to be occluded and was not injected.  The PDA provides collaterals to the totally occluded LAD.   Left ventriculography: Not done, CKD  Contrast: 108 cc  Final Conclusions:  Diffuse CAD, described above.  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  Patient has a chronic total occlusion of the LAD (relatively small LAD) with good collaterals from the RCA.  This would be very difficult to approach for PCI.  There  is moderate disease in multiple locations, including calcified ostial left main with probably 40-50% ostial stenosis.  LM stenosis probably is nonobstructive but would also be very difficult to approach percutaneously.  Would plan medical management at this time.  Will start Imdur 30 mg daily.  Will hold losartan for 1 day and Lasix until Monday will hydrate with 100 cc/hr NS x 6 hours post-cath. Close followup.    Loralie Champagne MD, [facility] 10/30/2014, 1:28 PM     Laboratory Data:  High Sensitivity Troponin:   Recent Labs  Lab 04/13/21 1051 04/13/21 1228  TROPONINIHS 1,562* 1,945*     Chemistry Recent Labs  Lab 04/13/21 0124  NA 141  K 4.5  CL 106  CO2 23  GLUCOSE 144*  BUN 70*  CREATININE 2.31*  CALCIUM 8.9  GFRNONAA 26*  ANIONGAP 12    Recent Labs  Lab 04/13/21 0124  PROT 6.8  ALBUMIN 3.3*  AST 40  ALT 33  ALKPHOS 92  BILITOT 1.1   Hematology Recent Labs  Lab 04/13/21 0124  WBC 14.6*  RBC 3.85*  HGB 11.4*  HCT 36.6*  MCV 95.1  MCH 29.6  MCHC 31.1  RDW 15.0  PLT 203   BNP Recent Labs  Lab 04/13/21 1051  BNP >4,500.0*    DDimer No results for input(s): DDIMER in the last 168 hours. Lab Results  Component Value Date   TSH 1.984 01/14/2021     Radiology/Studies:  CT ABDOMEN PELVIS WO CONTRAST  Result Date: 04/13/2021 CLINICAL DATA:  Nausea, vomiting and diarrhea EXAM: CT ABDOMEN AND PELVIS WITHOUT CONTRAST TECHNIQUE: Multidetector CT imaging of the abdomen and pelvis was performed following the standard protocol without IV contrast. COMPARISON:  05/31/2019 FINDINGS: LOWER CHEST: Small pleural effusions. HEPATOBILIARY: Normal hepatic contours. No intra- or extrahepatic biliary dilatation. There is cholelithiasis without acute inflammation. Small volume ascites in the upper abdomen. PANCREAS: Normal pancreas. No ductal dilatation or peripancreatic fluid collection. SPLEEN: Normal. ADRENALS/URINARY TRACT: The adrenal glands are normal. Multiple unchanged  renal cysts. The largest on the right measures 4.2 cm in the largest on the left measures 6.6 cm. The urinary bladder is normal for degree of distention STOMACH/BOWEL: There is no hiatal hernia. Normal duodenal course and caliber. No small bowel dilatation or inflammation. No focal colonic abnormality. Normal appendix. VASCULAR/LYMPHATIC: There is calcific atherosclerosis of the abdominal aorta. Remote aortic graft repair. No lymphadenopathy. REPRODUCTIVE: There are calcifications within the normal-sized prostate. Symmetric seminal vesicles. MUSCULOSKELETAL. No bony spinal canal stenosis or focal osseous abnormality. OTHER: None. IMPRESSION: 1. No acute abnormality of the abdomen or pelvis. 2. Small volume ascites and small pleural effusions. 3. Cholelithiasis without acute inflammation. Electronically Signed   By: Ulyses Jarred M.D.   On: 04/13/2021 03:37   DG Tibia/Fibula Left  Result Date:  04/13/2021 CLINICAL DATA:  Leg ulcer EXAM: LEFT TIBIA AND FIBULA - 2 VIEW COMPARISON:  None. FINDINGS: There is no evidence of fracture or other focal bone lesions. No focal erosion or periosteal elevation. Extensive vascular calcifications. Mild diffuse soft tissue swelling. IMPRESSION: 1. No acute osseous abnormality in the left tibia or fibula. 2. Mild diffuse soft tissue swelling. 3. Extensive vascular calcifications. Electronically Signed   By: Davina Poke D.O.   On: 04/13/2021 11:04   DG Tibia/Fibula Right  Result Date: 04/13/2021 CLINICAL DATA:  Leg ulcers.  Evaluate for osteomyelitis. EXAM: RIGHT TIBIA AND FIBULA - 2 VIEW COMPARISON:  Right knee radiographs 07/03/2020. Right tibia/fibula radiographs 09/15/2015. FINDINGS: No acute fracture is identified. The knee and ankle appear located. No interval osseous erosion is seen to indicate osteomyelitis. There is mild medial compartment joint space narrowing at the knee. Patellar and calcaneal enthesophytes are noted. There are surgical clips in the lower leg  with atherosclerotic vascular calcifications and soft tissue swelling. IMPRESSION: No evidence of osteomyelitis. Electronically Signed   By: Logan Bores M.D.   On: 04/13/2021 11:03   DG Chest Port 1 View  Result Date: 04/13/2021 CLINICAL DATA:  Hypoxia. EXAM: PORTABLE CHEST 1 VIEW COMPARISON:  02/20/2021. FINDINGS: Prior CABG. Cardiomegaly. Mild bilateral interstitial prominence and small bilateral pleural effusions. Findings suggest CHF. Mild bibasilar atelectasis. No pneumothorax. IMPRESSION: 1. Prior CABG. Cardiomegaly with mild bilateral interstitial prominence and small bilateral pleural effusions. Findings suggest CHF. 2.  Low lung volumes with bibasilar atelectasis. Electronically Signed   By: Marcello Moores  Register   On: 04/13/2021 11:05     Assessment and Plan:   1. Acute on chronic systolic CHF, possible CGS - no accurate weight today, +volume overload on exam - BNP very high, CHF on CXR - however, concern for sepsis and SBP 90s at times - pta on Demadex 80 mg qd (was supposed to be 80 mg am and 40 mg pm), on hold - discussed w/ Dr Margaretann Loveless  & Dr [name], will benefit from central line, will try to arrange - may need milrinone, needs CVP measurements and Coox to guide treatment - echo being performed, EF seems about the same  - w/ acute illness, CHF meds including hydralazine 50 mg tid, Demadex, Coreg and Imdur are on hold  2. NSTEMI - ez elevated and trending up - no reports of chest pain - pta not on ASA 2nd Eliquis, on Imdur 30 mg qd - he was supposed to decrease Coreg from 6.25 mg bid >> 3.125 mg bid but apparently has not done so - f/u on official echo results, continue med rx - Eliquis is held, on heparin - w/ Cr above baseline, no invasive eval planned at this time - follow for sx  3. Sepsis - pt afebrile but w/ AMS, LE cellulitis, elevated WBCs - ABX per IM   Risk Assessment/Risk Scores:     TIMI Risk Score for Unstable Angina or Non-ST Elevation MI:   The patient's  TIMI risk score is 5, which indicates a 26% risk of all cause mortality, new or recurrent myocardial infarction or need for urgent revascularization in the next 14 days.  New York Heart Association (NYHA) Functional Class NYHA Class IV        For questions or updates, please contact [facility] Please consult [email or web address] for contact info under    Signed, Rosaria Ferries, PA-C  04/13/2021 2:10 PM   Patient seen and examined with Rosaria Ferries, PA-C.  Agree as above,  with the following exceptions and changes as noted below.  Patient follows with Dr. [name] in heart failure clinic for a history of CAD status post CABG, chronic systolic heart failure with a most recent ejection fraction of 25 to 30% but just prior to that 40 to 45%, ischemic cardiomyopathy, bronchiectasis, COPD, CKD stage III, atrial fibrillation on amiodarone and Eliquis, CVA in 2016 who presents today for lower extremity swelling and concerns of delirium, now felt to have sepsis and heart failure exacerbation.  Gen: Patient appears able to answer simple questions but is not well oriented, CV: RRR, no murmurs, Lungs: Crackles laterally, Abd: soft, Extrem: Warm, 2+ edema with wounds in the bilateral legs, Neuro/Psych: Normal affect but not oriented, appears sedated. All available labs, radiology testing, previous records reviewed.   Patient presents with lower abdominal pain, lower extremity swelling, ascites on CT, lower extremity wounds, elevated white blood cell count, no fever, and signs of decompensated heart failure with a reduced ejection fraction of approximately 25 to 30% on bedside echocardiogram.  I am concerned that there is a mixed picture of sepsis and decompensated heart failure.  Prior to me visiting with the patient decision was made to transfer the patient to [facility].  Given availability of heart failure team, I have contacted Dr. [name] who plans to see the patient in consultation as well.  For likely sepsis,  recommend obtaining blood cultures.  Antibiotics per primary team.  Blood pressure is mildly hypotensive, would hold heart failure therapy at this time.  He has not yet received Lasix due to concerns of sepsis, but I suspect he will need diuretics given impaired renal function and possible cardiorenal syndrome with evidence of acute on chronic renal failure.   Troponin is elevated which may represent demand ischemia however heparin infusion has been started, agree.  Discussed with Dr. [name], we will avoid urgent cath given complex anatomy and renal failure at this time.  Elouise Munroe, MD 04/13/21 5:14 PM

## 2021-04-13 NOTE — ED Notes (Signed)
Echo at the bedside °

## 2021-04-13 NOTE — ED Notes (Signed)
Pt responsive to voice, continues to fall back asleep. VSS. ED provider notified.

## 2021-04-13 NOTE — ED Notes (Signed)
Report called to Freada Bergeron, RN.

## 2021-04-13 NOTE — Progress Notes (Signed)
Medical necessity consent given for PICC placement by Rosaria Ferries, PA.

## 2021-04-13 NOTE — ED Provider Notes (Signed)
Physical Exam  BP 114/63   Pulse 64   Temp 98.4 F (36.9 C) (Oral)   Resp 20   Ht 5\' 7"  (1.702 m)   Wt 77.1 kg   SpO2 91%   BMI 26.63 kg/m   Physical Exam The patient is still quite sleepy, and his oxygen saturations are slightly lower than upon arrival.  After his wife pointed out his skin lesions, I am thinking that his altered mental status might be delirium and might be secondary to his skin infection.  This would also explain his elevated white blood cell count.  I will add on some blood cultures, and also will address his hypoxia with cardiac enzymes, B natruretic peptide, and chest x-ray.  The patient will likely need to be admitted to the hospital.  He will be started on vancomycin while chest x-ray is pending.  I am suspicious for possible pneumonia, and I will expand coverage if necessary.  Skin exam of the lower legs reveals diffuse erythema of both legs.  Distal pulses intact.  The worst ulceration is at the right lower leg especially on the lateral and posterior aspect where there large, erythematous, purulent coated craters.  He also has some nonpitting edema bilaterally.         ED Course/Procedures   Clinical Course as of 04/13/21 1301  Tue Apr 13, 2021  0633 Patient presents with lower abdominal discomfort.  He did improve with some pain medication at home.  His work-up is largely reassuring.  Urinalysis without UTI.  CT scan does not show any obvious intra-abdominal pathology.  Patient was given 1 dose of pain medication.  Since that time he has been fairly somnolent.  He is arousable but falls back to sleep quite quickly.  Given his renal function, he may have some difficulty metabolizing.  Will monitor closely.  When he is able to wake up and eat and is back to his baseline, will plan for discharge.  He does report his wife can drive and pick him up. [CH]  1005 We were able to finally get a hold of the wife.  She mention the patient's legs, and upon exam, the  patient does have evidence of cellulitis and wounds on his legs.  Furthermore, he has not really woken up, and I am concerned about delirium. [AW]  2229 I spoke to Dr. Marylyn Ishihara who recommended speaking to cardiology as his NSTEMI/CHF is the most emergent or pressing issue. [AW]  1251 Trish with cards will come see the patient. No cards intervention. [facility] can safely admit. [AW]  7989 I spoke with Dr. Marylyn Ishihara who will admit. [AW]    Clinical Course User Index [AW] Arnaldo Natal, MD [CH] Dina Rich, Barbette Hair, MD    .Critical Care Performed by: Arnaldo Natal, MD Authorized by: Arnaldo Natal, MD   Critical care provider statement:    Critical care time (minutes):  45   Critical care time was exclusive of:  Separately billable procedures and treating other patients and teaching time   Critical care was necessary to treat or prevent imminent or life-threatening deterioration of the following conditions:  Cardiac failure, circulatory failure, metabolic crisis, CNS failure or compromise, respiratory failure, renal failure and sepsis   Critical care was time spent personally by me on the following activities:  Discussions with consultants, evaluation of patient's response to treatment, examination of patient, ordering and performing treatments and interventions, ordering and review of laboratory studies, ordering and review of radiographic studies, pulse  oximetry, re-evaluation of patient's condition, obtaining history from patient or surrogate and review of old charts   I assumed direction of critical care for this patient from another provider in my specialty: no     Care discussed with: admitting provider   Comments:     Patient has delirium, respiratory failure, acute on chronic kidney failure, and evidence of possible sepsis.    MDM  Awaiting transportation home and reassessment at shift change.   Now concern for cellulitis and delirium.  This patient was found to actually have multiple etiologies  of his symptoms.  It appears that he is having an NSTEMI versus CHF exacerbation.  He has known CHF with an EF that is depressed and around 25 to 35% based on an echocardiogram performed about 2 months ago.  He is having an increased oxygen requirement which appears to be consistent with CHF and/or NSTEMI.  He is also exhibiting signs and symptoms of delirium, and this could be due to his underlying medical condition as well.  Furthermore, he appears to have cellulitis of his legs.  I have coordinated care with cardiology and the hospitalist service, and the patient will be admitted for further evaluation and treatment.  Cardiology did not want to perform any emergent intervention, and at this point he is going to be admitted for medical management.     Arnaldo Natal, MD 04/13/21 [phone]

## 2021-04-13 NOTE — ED Notes (Addendum)
Troponin, 1,562. Received from lab. ED provider notified and aware. Repeat EKG obtained and given to provider. No additional orders at this time. Will continue to monitor. Provider to consult hospitalist and cardiology.

## 2021-04-13 NOTE — Progress Notes (Signed)
Pharmacy Antibiotic Note  Andrew [name] is a 87 y.o. male with hx CAD (s/p CABG), ischemic cardiomyopathy, CVA and afib on Eliquis PTA, presented to the ED on 4/19 with c/o abdominal pain and decreased appetite.  He was found to have diffuse erythema of the LE and elevated troponin.  Pharmacy has been consulted to dose vancomycin for LE cellulitis.  - 4/19 abd CT: No acute abnormality of the abdomen or pelvis. Small volume ascites and small pleural effusions. Cholelithiasis without acute inflammation - 4/19 right tibia/fibula xray: No evidence of osteomyelitis - 4/19 left tibia/fibula xray: No acute osseous abnormality in the left tibia or fibula.  Mild diffuse soft tissue swelling. - 4/19 CXR: Findings suggest CHF. Low lung volumes with bibasilar atelectasis   Plan: - vancomycin 1250mg  IVx1 given in the ED at 1102 on 4/19, then start vancomycin 1000 mg IV q48h for est AUC 432. - monitor renal function closely  ________________________________________  Height: 5\' 7"  (170.2 cm) Weight: 77.1 kg (170 lb) IBW/kg (Calculated) : 66.1  Temp (24hrs), Avg:98.6 F (37 C), Min:98.4 F (36.9 C), Max:98.8 F (37.1 C)  Recent Labs  Lab 04/13/21 0124  WBC 14.6*  CREATININE 2.31*    Estimated Creatinine Clearance: 19.9 mL/min (A) (by C-G formula based on SCr of 2.31 mg/dL (H)).    Allergies  Allergen Reactions  . Metoprolol Shortness Of Breath, Palpitations and Other (See Comments)    Heart starts racing. Shallow breathing; pt states he also get skin irritation on his legs  . Metformin Hives and Swelling    On legs  . Metformin And Related Nausea And Vomiting    Thank you for allowing pharmacy to be a part of this patient's care.  Lynelle Doctor 04/13/2021 2:39 PM

## 2021-04-13 NOTE — Progress Notes (Signed)
Attempts to call wife for consent for PICC unsuccessful.  Spoke with Judson Roch, RN updated.  Wife is not at bedside.  Message left by Judson Roch to wife's voicemail to call the hospital unreturned.  Page to MD.

## 2021-04-13 NOTE — ED Triage Notes (Signed)
Patient arrives via EMS from home with complaint of sudden onset lower abdominal pain that started about 45 minutes ago. Pt denies N/V/D. Decreased appetite x 3 weeks, and has not had a bowel movement in the last few days (exact date unknown). Pt had 2 Tylenol #3 prior to EMS arrival, pt now reports feeling better. Edema and weeping noted to both lower extremities, pt reports problem has been ongoing for months and he is being seen by a wound doctor.  Hx dementia, diabetes, COPD (wears 3 L O2 at home)  EMS vitals:  BP 118/62 HR 90 RR 18 SPO2 97% (3L) CBG 174

## 2021-04-13 NOTE — ED Notes (Signed)
Attempt to call wife for pt ride home, no answer

## 2021-04-13 NOTE — ED Notes (Signed)
This nurse spoke to and verified with pt's the plan for dispo and transfer of pt to [facility]. Per pt's wife ED provider has notified her of this plan as well. [name] verified understanding of plan.

## 2021-04-13 NOTE — ED Notes (Signed)
[facility] here to transport pt to [facility] 

## 2021-04-13 NOTE — H&P (Signed)
History and Physical    EDGER HUSAIN CZY:[id number] DOB: April 01, 1928 DOA: 04/13/2021  PCP: [name], MD  Patient coming from: Home  Chief Complaint: abdominal pain  HPI: Andrew [name] is a 87 y.o. male with medical history significant of combind HF, a fib on eliquis, hypothyroidism, HLD, PAD. Presenting with abdominal pain. History per wife as patient is a poor historian. Patient was originally seen this morning for abdominal pain. His wife reports that he's had RUQ ab pain for several months. It comes in waves. He's not seen a physician about it. He just dealt with it. It has apparently become worse over the last couple of days. The wife also noted that the patient has had a worsening of his leg wounds since he stopped going to the wound care center. He hasn't been to the center in at least 3 weeks. She says during that time, his legs have become more red and painful. He is not able to walk on his own at this point. She states that he stopped going just because he was tired of the hassle. She denies any other aggravating or alleviating factors.    ED Course: CT of abdomen and pelvis was revealing for cholelithiasis w/o acute inflammation. XR BLE was not concerning for osteo. CXR was concerning for CHF. He was found to have elevated tropinins and BNP. He was given vanc. Cardiology was consulted. TRH was called for admission.   Review of Systems:  Unable to obtain d/t mentation.   PMHx Past Medical History:  Diagnosis Date  . AAA (abdominal aortic aneurysm) ([place])   . Anemia   . Arthritis   . CAD (coronary artery disease)   . Cancer ([place])    Melanoma - Back  . Carotid artery stenosis   . CHF (congestive heart failure) ([place])   . COPD (chronic obstructive pulmonary disease) ([place])   . Cyst of kidney, acquired   . Depression   . Diabetes mellitus   . DVT (deep venous thrombosis) ([place])   . Dysrhythmia   . Fatty liver 2008  . GERD (gastroesophageal reflux disease)   .  Hyperlipidemia   . Hypertension   . IBS (irritable bowel syndrome)   . LVF (left ventricular failure) ([place])   . Paroxysmal atrial fibrillation ([facility])   . Pneumonia Jan. 2014  . Shortness of breath dyspnea   . Thyroid disease   . Typical atrial flutter ([place])   . UTI (urinary tract infection) 05/2015   While in [place]   . Vitamin D deficiency     PSHx Past Surgical History:  Procedure Laterality Date  . ABDOMINAL AORTIC ANEURYSM REPAIR  2002  . ABDOMINAL AORTOGRAM W/LOWER EXTREMITY Left 08/10/2020   Procedure: ABDOMINAL AORTOGRAM W/LOWER EXTREMITY;  Surgeon: Waynetta Sandy, MD;  Location: [facility];  Service: Cardiovascular;  Laterality: Left;  . BIOPSY  06/10/2020   Procedure: BIOPSY;  Surgeon: Ronnette Juniper, MD;  Location: [facility];  Service: Gastroenterology;;  . CARDIAC CATHETERIZATION    . CORONARY ANGIOPLASTY    . CORONARY ANGIOPLASTY WITH STENT PLACEMENT  2005  . CORONARY ARTERY BYPASS GRAFT  1992  . ESOPHAGEAL BRUSHING  06/10/2020   Procedure: ESOPHAGEAL BRUSHING;  Surgeon: Ronnette Juniper, MD;  Location: [facility];  Service: Gastroenterology;;  . ESOPHAGOGASTRODUODENOSCOPY N/A 06/10/2020   Procedure: ESOPHAGOGASTRODUODENOSCOPY (EGD);  Surgeon: Ronnette Juniper, MD;  Location: [facility];  Service: Gastroenterology;  Laterality: N/A;  . EYE SURGERY     [place]  . HERNIA REPAIR    .  INGUINAL HERNIA REPAIR Bilateral    w/mesh  . INSERTION OF MESH N/A 11/28/2017   Procedure: INSERTION OF MESH;  Surgeon: Donnie Mesa, MD;  Location: [facility];  Service: General;  Laterality: N/A;  GENERAL AND TAP BLOCK  . KNEE SURGERY Bilateral   . LAPAROSCOPIC INGUINAL HERNIA WITH UMBILICAL HERNIA Bilateral 11/28/2017   Procedure: LAPAROSCOPIC BILATERAL INGUINAL HERNIA REPAIR WITH MESH, UMBILICAL HERNIA REPAIR WITH MESH;  Surgeon: Donnie Mesa, MD;  Location: [facility];  Service: General;  Laterality: Bilateral;  GENERAL AND TAP BLOCK  . LEFT HEART CATHETERIZATION WITH CORONARY  ANGIOGRAM N/A 10/30/2014   Procedure: LEFT HEART CATHETERIZATION WITH CORONARY ANGIOGRAM;  Surgeon: Larey Dresser, MD;  Location: [facility];  Service: Cardiovascular;  Laterality: N/A;  . QUADRICEPS TENDON REPAIR Bilateral 08/2003   Archie Endo 05/10/2011  . UMBILICAL HERNIA REPAIR  11/28/2017   w/mesh    SocHx  reports that he has never smoked. He has never used smokeless tobacco. He reports that he does not drink alcohol and does not use drugs.  Allergies  Allergen Reactions  . Metoprolol Shortness Of Breath, Palpitations and Other (See Comments)    Heart starts racing. Shallow breathing; pt states he also get skin irritation on his legs  . Metformin Hives and Swelling    On legs  . Metformin And Related Nausea And Vomiting    FamHx Family History  Problem Relation Age of Onset  . Colon cancer Father   . Coronary artery disease Brother   . Diabetes Brother   . Heart disease Brother   . Hyperlipidemia Brother   . Peripheral vascular disease Brother        Varicose Veins  . Throat cancer Brother        abdominal cancer?   . Diabetes Son   . Hyperlipidemia Son     Prior to Admission medications   Medication Sig Start Date End Date Taking? Authorizing Provider  acetaminophen (TYLENOL) 500 MG tablet Take 2 tablets (1,000 mg total) by mouth every 8 (eight) hours as needed. 06/01/20  Yes [name], MD  amiodarone (PACERONE) 200 MG tablet Take 0.5 tablets (100 mg total) by mouth daily. 11/12/20  Yes Larey Dresser, MD  atorvastatin (LIPITOR) 80 MG tablet Take 1 tablet (80 mg total) by mouth daily. Patient taking differently: Take 80 mg by mouth every evening. 11/26/20  Yes Larey Dresser, MD  carvedilol (COREG) 6.25 MG tablet Take 6.25 mg by mouth 2 (two) times daily with a meal.   Yes [provider]  ELIQUIS 2.5 MG TABS tablet TAKE 1 TABLET BY MOUTH TWICE DAILY. Patient taking differently: Take 2.5 mg by mouth 2 (two) times daily. 01/12/21  Yes Larey Dresser, MD   empagliflozin (JARDIANCE) 10 MG TABS tablet Take 1 tablet (10 mg total) by mouth daily before breakfast. 01/14/21  Yes Larey Dresser, MD  ezetimibe (ZETIA) 10 MG tablet TAKE (1) TABLET DAILY. Patient taking differently: Take 10 mg by mouth daily. 03/17/21  Yes Larey Dresser, MD  FLUoxetine (PROZAC) 20 MG capsule TAKE (1) CAPSULE DAILY. Patient taking differently: Take 20 mg by mouth daily. 03/18/21  Yes [name], MD  gabapentin (NEURONTIN) 100 MG capsule Take 100 mg by mouth 3 (three) times daily.   Yes [provider]  hydrALAZINE (APRESOLINE) 50 MG tablet Take 1 tablet (50 mg total) by mouth 3 (three) times daily. 11/26/20  Yes Larey Dresser, MD  isosorbide mononitrate (IMDUR) 30 MG 24  hr tablet TAKE 1 TABLET EACH DAY. Patient taking differently: Take 30 mg by mouth daily. 04/06/21  Yes Larey Dresser, MD  levothyroxine (SYNTHROID) 100 MCG tablet Take 1 tablet (100 mcg total) by mouth daily before breakfast. 04/12/21  Yes Larey Dresser, MD  Multiple Vitamins-Minerals (MULTIVITAMIN ADULTS 50+) TABS Take 1 tablet by mouth daily in the afternoon.   Yes [provider]  nitroGLYCERIN (NITROSTAT) 0.4 MG SL tablet DISSOLVE 1 TABLET UNDER TONGUE AS NEEDED FOR CHEST PAIN,MAY REPEAT IN5 MINUTES FOR 2 DOSES. Patient taking differently: Place 0.4 mg under the tongue every 5 (five) minutes as needed for chest pain. 07/30/19  Yes Larey Dresser, MD  pantoprazole (PROTONIX) 40 MG tablet Take 1 tablet (40 mg total) by mouth 2 (two) times daily. 06/12/20  Yes Charlynne Cousins, MD  potassium chloride SA (KLOR-CON) 20 MEQ tablet TAKE 1 TABLET ONCE DAILY. Patient taking differently: Take 20 mEq by mouth daily. 03/17/21  Yes Larey Dresser, MD  tiotropium (SPIRIVA) 18 MCG inhalation capsule Place 18 mcg into inhaler and inhale daily as needed (for wheezing).   Yes [provider]  torsemide (DEMADEX) 20 MG tablet Take 4 tablets (80 mg total) by mouth every morning AND 2  tablets (40 mg total) every evening. Patient taking differently: Take 4 tablets (80 mg total) by mouth every morning as of 3/15. 01/14/21  Yes Larey Dresser, MD  metolazone (ZAROXOLYN) 2.5 MG tablet Take 1 tablet (2.5 mg total) by mouth as directed. By HF Clinic Patient not taking: No sig reported 08/20/20   Larey Dresser, MD    Physical Exam: Vitals:   04/13/21 1145 04/13/21 1159 04/13/21 1215 04/13/21 1230  BP: (!) 89/57 (!) 94/55 (!) 110/59 101/76  Pulse:   73 73  Resp: (!) 23 20 18 20   Temp:      TempSrc:      SpO2:   93% 92%  Weight:      Height:        General: 87 y.o. male resting in bed in NAD Eyes: PERRL, normal sclera ENMT: Nares patent w/o discharge, orophaynx clear, dentition normal, ears w/o discharge/lesions/ulcers Neck: Supple, trachea midline Cardiovascular: RRR, +S1, S2, no m/g/r, equal pulses throughout Respiratory:  Soft crackles, somewhat decreased at bases, increased WOB on 3L [place] GI: BS+, NDNT, no masses noted, no organomegaly noted MSK: BLE erythema, ulcerations/wounds Neuro: A&O x name only, not participating in full neuro exam Psyc: confused, but cooperative  Labs on Admission: I have personally reviewed following labs and imaging studies  CBC: Recent Labs  Lab 04/13/21 0124  WBC 14.6*  NEUTROABS 12.5*  HGB 11.4*  HCT 36.6*  MCV 95.1  PLT 315   Basic Metabolic Panel: Recent Labs  Lab 04/13/21 0124  NA 141  K 4.5  CL 106  CO2 23  GLUCOSE 144*  BUN 70*  CREATININE 2.31*  CALCIUM 8.9   GFR: Estimated Creatinine Clearance: 19.9 mL/min (A) (by C-G formula based on SCr of 2.31 mg/dL (H)). Liver Function Tests: Recent Labs  Lab 04/13/21 0124  AST 40  ALT 33  ALKPHOS 92  BILITOT 1.1  PROT 6.8  ALBUMIN 3.3*   Recent Labs  Lab 04/13/21 0124  LIPASE 58*   No results for input(s): AMMONIA in the last 168 hours. Coagulation Profile: No results for input(s): INR, PROTIME in the last 168 hours. Cardiac Enzymes: No results for  input(s): CKTOTAL, CKMB, CKMBINDEX, TROPONINI in the last 168 hours. BNP (last  3 results) No results for input(s): PROBNP in the last 8760 hours. HbA1C: No results for input(s): HGBA1C in the last 72 hours. CBG: No results for input(s): GLUCAP in the last 168 hours. Lipid Profile: No results for input(s): CHOL, HDL, LDLCALC, TRIG, CHOLHDL, LDLDIRECT in the last 72 hours. Thyroid Function Tests: No results for input(s): TSH, T4TOTAL, FREET4, T3FREE, THYROIDAB in the last 72 hours. Anemia Panel: No results for input(s): VITAMINB12, FOLATE, FERRITIN, TIBC, IRON, RETICCTPCT in the last 72 hours. Urine analysis:    Component Value Date/Time   COLORURINE YELLOW 04/13/2021 [place] 04/13/2021 0257   LABSPEC 1.014 04/13/2021 0257   PHURINE 5.0 04/13/2021 0257   GLUCOSEU 50 (A) 04/13/2021 0257   GLUCOSEU NEGATIVE 01/13/2016 1342   HGBUR NEGATIVE 04/13/2021 0257   HGBUR negative 02/03/2009 0912   BILIRUBINUR NEGATIVE 04/13/2021 0257   BILIRUBINUR small 07/05/2015 1020   KETONESUR NEGATIVE 04/13/2021 0257   PROTEINUR 30 (A) 04/13/2021 0257   UROBILINOGEN 0.2 01/13/2016 1342   NITRITE NEGATIVE 04/13/2021 0257   LEUKOCYTESUR NEGATIVE 04/13/2021 0257    Radiological Exams on Admission: CT ABDOMEN PELVIS WO CONTRAST  Result Date: 04/13/2021 CLINICAL DATA:  Nausea, vomiting and diarrhea EXAM: CT ABDOMEN AND PELVIS WITHOUT CONTRAST TECHNIQUE: Multidetector CT imaging of the abdomen and pelvis was performed following the standard protocol without IV contrast. COMPARISON:  05/31/2019 FINDINGS: LOWER CHEST: Small pleural effusions. HEPATOBILIARY: Normal hepatic contours. No intra- or extrahepatic biliary dilatation. There is cholelithiasis without acute inflammation. Small volume ascites in the upper abdomen. PANCREAS: Normal pancreas. No ductal dilatation or peripancreatic fluid collection. SPLEEN: Normal. ADRENALS/URINARY TRACT: The adrenal glands are normal. Multiple unchanged  renal cysts. The largest on the right measures 4.2 cm in the largest on the left measures 6.6 cm. The urinary bladder is normal for degree of distention STOMACH/BOWEL: There is no hiatal hernia. Normal duodenal course and caliber. No small bowel dilatation or inflammation. No focal colonic abnormality. Normal appendix. VASCULAR/LYMPHATIC: There is calcific atherosclerosis of the abdominal aorta. Remote aortic graft repair. No lymphadenopathy. REPRODUCTIVE: There are calcifications within the normal-sized prostate. Symmetric seminal vesicles. MUSCULOSKELETAL. No bony spinal canal stenosis or focal osseous abnormality. OTHER: None. IMPRESSION: 1. No acute abnormality of the abdomen or pelvis. 2. Small volume ascites and small pleural effusions. 3. Cholelithiasis without acute inflammation. Electronically Signed   By: Ulyses Jarred M.D.   On: 04/13/2021 03:37   DG Tibia/Fibula Left  Result Date: 04/13/2021 CLINICAL DATA:  Leg ulcer EXAM: LEFT TIBIA AND FIBULA - 2 VIEW COMPARISON:  None. FINDINGS: There is no evidence of fracture or other focal bone lesions. No focal erosion or periosteal elevation. Extensive vascular calcifications. Mild diffuse soft tissue swelling. IMPRESSION: 1. No acute osseous abnormality in the left tibia or fibula. 2. Mild diffuse soft tissue swelling. 3. Extensive vascular calcifications. Electronically Signed   By: Davina Poke D.O.   On: 04/13/2021 11:04   DG Tibia/Fibula Right  Result Date: 04/13/2021 CLINICAL DATA:  Leg ulcers.  Evaluate for osteomyelitis. EXAM: RIGHT TIBIA AND FIBULA - 2 VIEW COMPARISON:  Right knee radiographs 07/03/2020. Right tibia/fibula radiographs 09/15/2015. FINDINGS: No acute fracture is identified. The knee and ankle appear located. No interval osseous erosion is seen to indicate osteomyelitis. There is mild medial compartment joint space narrowing at the knee. Patellar and calcaneal enthesophytes are noted. There are surgical clips in the lower leg  with atherosclerotic vascular calcifications and soft tissue swelling. IMPRESSION: No evidence of osteomyelitis. Electronically Signed  By: Logan Bores M.D.   On: 04/13/2021 11:03   DG Chest Port 1 View  Result Date: 04/13/2021 CLINICAL DATA:  Hypoxia. EXAM: PORTABLE CHEST 1 VIEW COMPARISON:  02/20/2021. FINDINGS: Prior CABG. Cardiomegaly. Mild bilateral interstitial prominence and small bilateral pleural effusions. Findings suggest CHF. Mild bibasilar atelectasis. No pneumothorax. IMPRESSION: 1. Prior CABG. Cardiomegaly with mild bilateral interstitial prominence and small bilateral pleural effusions. Findings suggest CHF. 2.  Low lung volumes with bibasilar atelectasis. Electronically Signed   By: Marcello Moores  Register   On: 04/13/2021 11:05   Assessment/Plan Sepsis secondary to BLE cellulitis     - admit to inpt, [facility]     - pharm consult for vanc; add rocephin     - Bld Cx     - wound care  NSTEMI? Acute on chronic combined HF     - trp 1520 -> 1945; denies CP, questionable Q's on EKG     - BNP > 4500     - last echo 01/2021 showed EF 25% w/ global hypokinesis     - will hold eliquis and start heparin gtt     - Cards consulted; appreciate assistance; will defer management to them  AKI on CKD3b     - multifactorial etiology     - will need vanc and lasix; monitor renal function; he's not acidic and K+ is ok  Chronic hypoxic respiratory failure on 3L [place]     - normally on 2 - 3 L [place] at night only     - currently maintain sats on home dosing; follow  Hypothyroidism     - continue synthroid  PAF on eliquis     - BP is soft and rates are controlled; will hold home coreg for now     - he's on eliquis at home; will hold until cardiac plan determined  Hx of HTN     - pressures are soft, hold home regimen  Hx of PAD HLD     - continue zetia, lipitior  Abdominal pain     - he is not complaining of abdominal pain on exam     - CT ab/pelvis w/ cholelithiasis w/o acute  inflammation     - can consider RUQ [place] or HIDA after we get his heart and legs cleaned up  DVT prophylaxis: heparin  Code Status: FULL  Family Communication: w/ wife by phone  Consults called: Cardiology   Status is: Inpatient  Remains inpatient appropriate because:Inpatient level of care appropriate due to severity of illness   Dispo: The patient is from: Home              Anticipated d/c is to: TBD              Patient currently is not medically stable to d/c.   Difficult to place patient No  Time spent coordinating admission: 70 minutes  [facility]  If 7PM-7AM, please contact night-coverage [email or web address]  04/13/2021, 1:21 PM

## 2021-04-13 NOTE — ED Notes (Signed)
ED Provider and this nurse evaluated pt's bilateral lower legs, which appear to be swollen, discolored, and draining with multiple open areas. Per ED doc, wound care to be paged, pt to receive IV abx, and possible admission.

## 2021-04-13 NOTE — Progress Notes (Signed)
Iv team called, will come and place picc as soon as they can. They will call bedside nurse when come to place picc.  Bedside RN aware.

## 2021-04-13 NOTE — Progress Notes (Signed)
This RN attempted to reach iv team x2 for picc line placement. No answered and no call back at this time. Will continue to attempt.

## 2021-04-13 NOTE — ED Notes (Signed)
Pt given crackers and juice for PO challenge. Pt woken up and continues to fall back asleep when asked to drink or eat.

## 2021-04-13 NOTE — Plan of Care (Signed)
  Problem: Education: Goal: Ability to demonstrate management of disease process will improve 04/13/2021 1623 by Shelton Silvas, RN Outcome: Progressing 04/13/2021 1623 by Shelton Silvas, RN Outcome: Progressing Goal: Ability to verbalize understanding of medication therapies will improve 04/13/2021 1623 by Shelton Silvas, RN Outcome: Progressing 04/13/2021 1623 by Shelton Silvas, RN Outcome: Progressing Goal: Individualized Educational Video(s) 04/13/2021 1623 by Shelton Silvas, RN Outcome: Progressing 04/13/2021 1623 by Shelton Silvas, RN Outcome: Progressing   Problem: Activity: Goal: Capacity to carry out activities will improve 04/13/2021 1623 by Shelton Silvas, RN Outcome: Progressing 04/13/2021 1623 by Shelton Silvas, RN Outcome: Progressing   Problem: Cardiac: Goal: Ability to achieve and maintain adequate cardiopulmonary perfusion will improve 04/13/2021 1623 by Shelton Silvas, RN Outcome: Progressing 04/13/2021 1623 by Shelton Silvas, RN Outcome: Progressing

## 2021-04-13 NOTE — Progress Notes (Signed)
Initial CVP taken at 2247: result of 11 obtained.

## 2021-04-13 NOTE — ED Notes (Signed)
This nurse spoke with pt's wife, Ziv Welchel. Per pt's wife, the pt's legs have been swollen, she states "more so than usual." This nurse alerted the ED provider regarding this.

## 2021-04-13 NOTE — ED Notes (Signed)
Tech and Nurse placed pt on condom cath

## 2021-04-14 ENCOUNTER — Encounter (HOSPITAL_COMMUNITY): Payer: Medicare PPO | Admitting: Cardiology

## 2021-04-14 DIAGNOSIS — I5023 Acute on chronic systolic (congestive) heart failure: Secondary | ICD-10-CM | POA: Diagnosis not present

## 2021-04-14 LAB — CBC WITH DIFFERENTIAL/PLATELET
Abs Immature Granulocytes: 0.32 10*3/uL — ABNORMAL HIGH (ref 0.00–0.07)
Basophils Absolute: 0 10*3/uL (ref 0.0–0.1)
Basophils Relative: 0 %
Eosinophils Absolute: 0 10*3/uL (ref 0.0–0.5)
Eosinophils Relative: 0 %
HCT: 33 % — ABNORMAL LOW (ref 39.0–52.0)
Hemoglobin: 10.2 g/dL — ABNORMAL LOW (ref 13.0–17.0)
Immature Granulocytes: 2 %
Lymphocytes Relative: 7 %
Lymphs Abs: 1.2 10*3/uL (ref 0.7–4.0)
MCH: 29.6 pg (ref 26.0–34.0)
MCHC: 30.9 g/dL (ref 30.0–36.0)
MCV: 95.7 fL (ref 80.0–100.0)
Monocytes Absolute: 1.1 10*3/uL — ABNORMAL HIGH (ref 0.1–1.0)
Monocytes Relative: 7 %
Neutro Abs: 14.1 10*3/uL — ABNORMAL HIGH (ref 1.7–7.7)
Neutrophils Relative %: 84 %
Platelets: 170 10*3/uL (ref 150–400)
RBC: 3.45 MIL/uL — ABNORMAL LOW (ref 4.22–5.81)
RDW: 15.2 % (ref 11.5–15.5)
WBC Morphology: INCREASED
WBC: 16.7 10*3/uL — ABNORMAL HIGH (ref 4.0–10.5)
nRBC: 0 % (ref 0.0–0.2)

## 2021-04-14 LAB — COMPREHENSIVE METABOLIC PANEL
ALT: 76 U/L — ABNORMAL HIGH (ref 0–44)
AST: 101 U/L — ABNORMAL HIGH (ref 15–41)
Albumin: 2.7 g/dL — ABNORMAL LOW (ref 3.5–5.0)
Alkaline Phosphatase: 77 U/L (ref 38–126)
Anion gap: 13 (ref 5–15)
BUN: 81 mg/dL — ABNORMAL HIGH (ref 8–23)
CO2: 22 mmol/L (ref 22–32)
Calcium: 8.5 mg/dL — ABNORMAL LOW (ref 8.9–10.3)
Chloride: 104 mmol/L (ref 98–111)
Creatinine, Ser: 2.91 mg/dL — ABNORMAL HIGH (ref 0.61–1.24)
GFR, Estimated: 20 mL/min — ABNORMAL LOW (ref >60)
Glucose, Bld: 98 mg/dL (ref 70–99)
Potassium: 4.3 mmol/L (ref 3.5–5.1)
Sodium: 139 mmol/L (ref 135–145)
Total Bilirubin: 1.1 mg/dL (ref 0.3–1.2)
Total Protein: 5.8 g/dL — ABNORMAL LOW (ref 6.5–8.1)

## 2021-04-14 LAB — BASIC METABOLIC PANEL
Anion gap: 11 (ref 5–15)
BUN: 74 mg/dL — ABNORMAL HIGH (ref 8–23)
CO2: 22 mmol/L (ref 22–32)
Calcium: 8.4 mg/dL — ABNORMAL LOW (ref 8.9–10.3)
Chloride: 104 mmol/L (ref 98–111)
Creatinine, Ser: 2.82 mg/dL — ABNORMAL HIGH (ref 0.61–1.24)
GFR, Estimated: 21 mL/min — ABNORMAL LOW (ref >60)
Glucose, Bld: 94 mg/dL (ref 70–99)
Potassium: 4.3 mmol/L (ref 3.5–5.1)
Sodium: 137 mmol/L (ref 135–145)

## 2021-04-14 LAB — COOXEMETRY PANEL
Carboxyhemoglobin: 1.6 % — ABNORMAL HIGH (ref 0.5–1.5)
Methemoglobin: 0.8 % (ref 0.0–1.5)
O2 Saturation: 64.8 %
Total hemoglobin: 10.4 g/dL — ABNORMAL LOW (ref 12.0–16.0)

## 2021-04-14 LAB — HEPARIN LEVEL (UNFRACTIONATED)
Heparin Unfractionated: 0.91 IU/mL — ABNORMAL HIGH (ref 0.30–0.70)
Heparin Unfractionated: 0.82 IU/mL — ABNORMAL HIGH (ref 0.30–0.70)

## 2021-04-14 LAB — GLUCOSE, CAPILLARY: Glucose-Capillary: 140 mg/dL — ABNORMAL HIGH (ref 70–99)

## 2021-04-14 LAB — APTT
aPTT: 79 seconds — ABNORMAL HIGH (ref 24–36)
aPTT: 62 seconds — ABNORMAL HIGH (ref 24–36)

## 2021-04-14 LAB — PROCALCITONIN: Procalcitonin: 3.61 ng/mL

## 2021-04-14 MED ORDER — ASPIRIN 81 MG PO CHEW
81.0000 mg | CHEWABLE_TABLET | Freq: Every day | ORAL | Status: DC
  Administered 2021-04-14 – 2021-04-17 (×4): 81 mg via ORAL
  Filled 2021-04-14 (×4): qty 1

## 2021-04-14 MED ORDER — ADULT MULTIVITAMIN W/MINERALS CH
1.0000 | ORAL_TABLET | Freq: Every day | ORAL | Status: DC
  Administered 2021-04-14 – 2021-04-17 (×4): 1 via ORAL
  Filled 2021-04-14 (×3): qty 1

## 2021-04-14 MED ORDER — MILRINONE LACTATE IN DEXTROSE 20-5 MG/100ML-% IV SOLN
0.2500 ug/kg/min | INTRAVENOUS | Status: DC
  Administered 2021-04-14 – 2021-04-16 (×4): 0.25 ug/kg/min via INTRAVENOUS
  Filled 2021-04-14 (×5): qty 100

## 2021-04-14 MED ORDER — PROSOURCE PLUS PO LIQD
30.0000 mL | Freq: Two times a day (BID) | ORAL | Status: DC
  Administered 2021-04-14 – 2021-04-17 (×6): 30 mL via ORAL
  Filled 2021-04-14 (×5): qty 30

## 2021-04-14 MED ORDER — FUROSEMIDE 10 MG/ML IJ SOLN
80.0000 mg | Freq: Two times a day (BID) | INTRAMUSCULAR | Status: DC
  Administered 2021-04-14 – 2021-04-16 (×4): 80 mg via INTRAVENOUS
  Filled 2021-04-14 (×4): qty 8

## 2021-04-14 NOTE — Progress Notes (Signed)
Initial Nutrition Assessment  DOCUMENTATION CODES:   Not applicable  INTERVENTION:   -30 ml Prosource Plus BID, each supplement provides 100 kcals and 15 grams protein -MVI with minerals daily  NUTRITION DIAGNOSIS:   Inadequate oral intake related to lethargy/confusion as evidenced by meal completion < 25%.  GOAL:   Patient will meet greater than or equal to 90% of their needs  MONITOR:   PO intake,Supplement acceptance,Labs,Weight trends,Skin,I & O's  REASON FOR ASSESSMENT:   Malnutrition Screening Tool    ASSESSMENT:   Andrew [name] is a 87 y.o. male with medical history significant of combind HF, a fib on eliquis, hypothyroidism, HLD, PAD.  Pt admitted with sepsis secondary to BLE cellulitis.   Reviewed I/O's: +1.4 L x 24 hours  UOP: 275 ml x 24 hours  Pt very lethargic at time of visit. He did not respond to voice or touch. NO family at bedside to provide additional history.   Lunch tray at bedside, unattempted. However, noted meal completion 75-100%.   Reviewed wt hx; wt has been stable over the past 4 months.   Medications reviewed.   Labs reviewed.   NUTRITION - FOCUSED PHYSICAL EXAM:  Flowsheet Row Most Recent Value  Orbital Region No depletion  Upper Arm Region No depletion  Thoracic and Lumbar Region No depletion  Buccal Region No depletion  Temple Region No depletion  Clavicle Bone Region No depletion  Clavicle and Acromion Bone Region No depletion  Scapular Bone Region No depletion  Dorsal Hand No depletion  Patellar Region No depletion  Anterior Thigh Region No depletion  Posterior Calf Region No depletion  Edema (RD Assessment) None  Hair Reviewed  Eyes Reviewed  Mouth Reviewed  Skin Reviewed  Nails Reviewed       Diet Order:   Diet Order            Diet 2 gram sodium Room service appropriate? Yes; Fluid consistency: Thin  Diet effective now                 EDUCATION NEEDS:   No education needs have been identified  at this time  Skin:  Skin Assessment: Reviewed RN Assessment  Last BM:  04/13/21  Height:   Ht Readings from Last 1 Encounters:  04/13/21 5\' 7"  (1.702 m)    Weight:   Wt Readings from Last 1 Encounters:  04/14/21 78.2 kg    Ideal Body Weight:  67.3 kg  BMI:  Body mass index is 27 kg/m.  Estimated Nutritional Needs:   Kcal:  5852-7782  Protein:  85-100 grams  Fluid:  > 1.7 L    Loistine Chance, RD, LDN, [place] Registered Dietitian II Certified Diabetes Care and Education Specialist Please refer to [facility] for RD and/or RD on-call/weekend/after hours pager

## 2021-04-14 NOTE — Progress Notes (Signed)
[place] for heparin Indication: NSTEMI  Labs: Recent Labs    04/13/21 0124 04/13/21 1051 04/13/21 1228 04/13/21 1516 04/13/21 1654 04/13/21 1926 04/13/21 2332 04/14/21 0154 04/14/21 0155  HGB 11.4*  --   --   --   --   --   --   --   --   HCT 36.6*  --   --   --   --   --   --   --   --   PLT 203  --   --   --   --   --   --   --   --   APTT  --   --   --  33  --   --   --  79*  --   HEPARINUNFRC  --   --   --  0.81*  --   --   --   --  0.91*  CREATININE 2.31*  --   --   --   --   --  2.82*  --   --   TROPONINIHS  --    < > 1,945*  --  3,107* 4,008*  --   --   --    < > = values in this interval not displayed.    Medications:  - on Eliqis 2.5 mg bid PTA (last dose taken on 4/17 at 1800)  Assessment: Patient is a 87 y.o M with hx CAD (s/p CABG), ischemic cardiomyopathy, CVA and afib on Eliquis PTA, presented to the ED on 4/19 with c/o abdominal pain and decreased appetite.  He was found to have diffuse erythema of the LE and elevated troponin.  Pharmacy has been consulted to start heparin drip for NSTEMI. Initial heparin level 0.91 units/ml, aPTT in goal range 79 sec  Goal of Therapy:  Heparin level 0.3-0.7 units/ml aPTT 66-102 seconds Monitor platelets by anticoagulation protocol: Yes   Plan:  -Continue heparin drip at 950 units/hr - check heparin level and aPTT later today to confirm - monitor for s/sx bleeding  Ranulfo Kall Poteet 04/14/2021,2:38 AM

## 2021-04-14 NOTE — TOC Initial Note (Signed)
Transition of Care (TOC) - Initial/Assessment Note  Heart Failure   Patient Details  Name: Andrew [name] MRN: [id number] Date of Birth: [birth year]  Transition of Care [facility]) CM/SW Contact:    [place], [place] Phone Number: 04/14/2021, 11:26 AM  Clinical Narrative:                 CSW attempted to visit the patient at bedside to introduce self as the heart failure social worker and to complete a very brief SDOH screening with the patient to address social needs as needed however the patient was unresponsive and unable to engage in conversation at this time.   CSW will continue to follow for discharge needs and will check back with the patient at another time.          Patient Goals and CMS Choice        Expected Discharge Plan and Services                                                Prior Living Arrangements/Services                       Activities of Daily Living Home Assistive Devices/Equipment: Cane (specify quad or straight),Walker (specify type),Oxygen,Wheelchair (front wheeled walker, single point cane) ADL Screening (condition at time of admission) Patient's cognitive ability adequate to safely complete daily activities?: No Is the patient deaf or have difficulty hearing?: Yes (very HOH) Does the patient have difficulty seeing, even when wearing glasses/contacts?: No Does the patient have difficulty concentrating, remembering, or making decisions?: Yes Patient able to express need for assistance with ADLs?: Yes Does the patient have difficulty dressing or bathing?: Yes Independently performs ADLs?: No Communication: Independent Dressing (OT): Needs assistance Is this a change from baseline?: Pre-admission baseline Grooming: Independent Feeding: Independent Bathing: Needs assistance Is this a change from baseline?: Pre-admission baseline Toileting: Needs assistance Is this a change from baseline?: Pre-admission baseline In/Out  Bed: Needs assistance Is this a change from baseline?: Pre-admission baseline Walks in Home: Needs assistance (patient uses walker inside of home and wheelchair when out of the house) Is this a change from baseline?: Pre-admission baseline Does the patient have difficulty walking or climbing stairs?: Yes (secondary to weakness and lower extremity swelling) Weakness of Legs: Both Weakness of Arms/Hands: None  Permission Sought/Granted                  Emotional Assessment              Admission diagnosis:  Lower abdominal pain [R10.30] NSTEMI (non-ST elevated myocardial infarction) ([place]) [I21.4] Acute on chronic systolic congestive heart failure (HCC) [I50.23] Cellulitis of left lower extremity [L03.116] Cellulitis of right lower extremity [L03.115] Acute on chronic systolic CHF (congestive heart failure) ([place]) [I50.23] Chronic kidney disease, stage 4 (severe) ([place]) [N18.4] Patient Active Problem List   Diagnosis Date Noted  . Acute on chronic systolic CHF (congestive heart failure) ([place]) 04/13/2021  . Acute on chronic systolic congestive heart failure ([place])   . Cellulitis of left lower extremity   . Acute respiratory failure with hypoxia ([place]) 02/20/2021  . COPD (chronic obstructive pulmonary disease) ([place]) 02/20/2021  . History of CVA (cerebrovascular accident)   . Acute respiratory failure with hypercapnia ([place]) 06/02/2020  . Acute metabolic encephalopathy 19/62/2297  . CKD (chronic kidney disease),  stage IV ([place]) 06/02/2020  . PAF (paroxysmal atrial fibrillation) ([place]) 06/02/2020  . Hypothyroidism 06/02/2020  . Chronic anticoagulation 06/02/2020  . Debility 06/01/2020  . Lumbar spondylosis 10/04/2018  . Chronic respiratory failure with hypoxia ([place]) 09/16/2018  . Pain of right thumb 06/19/2018  . Bilateral recurrent inguinal hernias 11/28/2017  . Carotid artery stenosis 01/12/2016  . Chronic systolic CHF (congestive heart failure) ([place]) 12/02/2015  . Acute on  chronic combined systolic and diastolic CHF (congestive heart failure) ([place]) 11/11/2015  . 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 PVD (peripheral vascular disease) ([place])   . Hereditary and idiopathic peripheral neuropathy 10/02/2015  . Peripelvic (lymphatic) cyst   . Pernicious anemia 07/15/2015  . Bilateral renal cysts 07/12/2015  . Lung nodule, 23mm RML CT 07/12/15 07/12/2015  . Chronic kidney disease, stage 3b ([place]) 06/14/2015  . Paroxysmal atrial fibrillation ([place]) 05/28/2015  . AAA (abdominal aortic aneurysm) without rupture ([place]) 09/30/2014  . Cardiomyopathy, ischemic 05/23/2014  . DM (diabetes mellitus), type 2 with renal complications ([place]) 28/76/8115  . CAD (coronary artery disease) 11/09/2010  . Depression, major, single episode, complete remission ([place]) 03/18/2010  . Hypothyroidism 02/03/2009  . Osteoarthritis 05/27/2008  . Hyperlipidemia associated with type 2 diabetes mellitus ([place]) 06/14/2007  . Hypertension associated with diabetes ([place]) 06/14/2007  . GERD 06/14/2007   PCP:  [name], MD Pharmacy:   [place], [place] [address] Phone: [phone] Fax: [phone]     Social Determinants of Health (SDOH) Interventions    Readmission Risk Interventions Readmission Risk Prevention Plan 06/08/2020 07/04/2019  Transportation Screening Complete Complete  PCP or Specialist Appt within 3-5 Days Not Complete Complete  Not Complete comments disposition pending- likely SNF -  [place] or [place] Complete Complete  Social Work Consult for [place] Planning/Counseling Complete Complete  Palliative Care Screening Not Applicable Complete  Medication Review (RN Care Manager) Referral to Pharmacy Complete  Some recent data might be hidden   [place], MSW, [place] Heart Failure Social Worker

## 2021-04-14 NOTE — Progress Notes (Signed)
Patient ID: Andrew [name], male   DOB: 06-26-1928, 87 y.o.   MRN: [id number]     Advanced Heart Failure Rounding Note  PCP-Cardiologist: Loralie Champagne, MD   Subjective:    Sleeping this morning, confused but can be reoriented.  Co-ox mildly low yesterday at 57%, was started on milrinone 0.25 and co-ox up to 65%.  CVP 8-9 this morning.  Not much UOP recorded with IV Lasix last night (275 cc).  Creatinine 2.8 => 2.91.    Afebrile, WBCs 16.7.  He is on vancomycin/ceftriaxone for cellulitis.   He appears to be in rate-controlled atrial fibrillation currently.    Objective:   Weight Range: 78.2 kg Body mass index is 27 kg/m.   Vital Signs:   Temp:  [98.4 F (36.9 C)-99.3 F (37.4 C)] 98.5 F (36.9 C) (04/20 0400) Pulse Rate:  [67-93] 77 (04/20 0400) Resp:  [13-25] 18 (04/20 0400) BP: (89-125)/(52-87) 113/57 (04/20 0400) SpO2:  [89 %-99 %] 98 % (04/20 0400) Weight:  [75.2 kg-78.2 kg] 78.2 kg (04/20 0034) Last BM Date: 04/13/21 (per patient)  Weight change: Filed Weights   04/13/21 0022 04/13/21 1459 04/14/21 0034  Weight: 77.1 kg 75.2 kg 78.2 kg    Intake/Output:   Intake/Output Summary (Last 24 hours) at 04/14/2021 0925 Last data filed at 04/14/2021 0836 Gross per 24 hour  Intake 2075.14 ml  Output 275 ml  Net 1800.14 ml      Physical Exam    General:  NAD, sleeping but awakens.  HEENT: Normal Neck: Supple. JVP 10 cm. Carotids 2+ bilat; no bruits. No lymphadenopathy or thyromegaly appreciated. Cor: PMI nondisplaced. Irregular rate & rhythm. No rubs, gallops or murmurs. Lungs: Crackles at bases.  Abdomen: Soft, nontender, nondistended. No hepatosplenomegaly. No bruits or masses. Good bowel sounds. Extremities: Lower legs wrapped, 1+ edema 3/4 to knees.   Neuro: Confused but can be reoriented.    Telemetry   Atrial fibrillation in 60s (personally reviewed)   Labs    CBC Recent Labs    04/13/21 0124 04/14/21 0455  WBC 14.6* 16.7*  NEUTROABS 12.5*  14.1*  HGB 11.4* 10.2*  HCT 36.6* 33.0*  MCV 95.1 95.7  PLT 203 254   Basic Metabolic Panel Recent Labs    04/13/21 2332 04/14/21 0455  NA 137 139  K 4.3 4.3  CL 104 104  CO2 22 22  GLUCOSE 94 98  BUN 74* 81*  CREATININE 2.82* 2.91*  CALCIUM 8.4* 8.5*   Liver Function Tests Recent Labs    04/13/21 0124 04/14/21 0455  AST 40 101*  ALT 33 76*  ALKPHOS 92 77  BILITOT 1.1 1.1  PROT 6.8 5.8*  ALBUMIN 3.3* 2.7*   Recent Labs    04/13/21 0124  LIPASE 58*   Cardiac Enzymes No results for input(s): CKTOTAL, CKMB, CKMBINDEX, TROPONINI in the last 72 hours.  BNP: BNP (last 3 results) Recent Labs    06/03/20 0604 02/20/21 0313 04/13/21 1051  BNP 1,981.3* 1,833.3* >4,500.0*    ProBNP (last 3 results) No results for input(s): PROBNP in the last 8760 hours.   D-Dimer No results for input(s): DDIMER in the last 72 hours. Hemoglobin A1C No results for input(s): HGBA1C in the last 72 hours. Fasting Lipid Panel No results for input(s): CHOL, HDL, LDLCALC, TRIG, CHOLHDL, LDLDIRECT in the last 72 hours. Thyroid Function Tests No results for input(s): TSH, T4TOTAL, T3FREE, THYROIDAB in the last 72 hours.  Invalid input(s): FREET3  Other results:   Imaging  DG Tibia/Fibula Left  Result Date: 04/13/2021 CLINICAL DATA:  Leg ulcer EXAM: LEFT TIBIA AND FIBULA - 2 VIEW COMPARISON:  None. FINDINGS: There is no evidence of fracture or other focal bone lesions. No focal erosion or periosteal elevation. Extensive vascular calcifications. Mild diffuse soft tissue swelling. IMPRESSION: 1. No acute osseous abnormality in the left tibia or fibula. 2. Mild diffuse soft tissue swelling. 3. Extensive vascular calcifications. Electronically Signed   By: Davina Poke D.O.   On: 04/13/2021 11:04   DG Tibia/Fibula Right  Result Date: 04/13/2021 CLINICAL DATA:  Leg ulcers.  Evaluate for osteomyelitis. EXAM: RIGHT TIBIA AND FIBULA - 2 VIEW COMPARISON:  Right knee radiographs  07/03/2020. Right tibia/fibula radiographs 09/15/2015. FINDINGS: No acute fracture is identified. The knee and ankle appear located. No interval osseous erosion is seen to indicate osteomyelitis. There is mild medial compartment joint space narrowing at the knee. Patellar and calcaneal enthesophytes are noted. There are surgical clips in the lower leg with atherosclerotic vascular calcifications and soft tissue swelling. IMPRESSION: No evidence of osteomyelitis. Electronically Signed   By: Logan Bores M.D.   On: 04/13/2021 11:03   DG CHEST PORT 1 VIEW  Result Date: 04/13/2021 CLINICAL DATA:  PICC line placement EXAM: PORTABLE CHEST 1 VIEW COMPARISON:  04/13/2021, 02/20/2021 CT 07/03/2019 FINDINGS: Post sternotomy changes. Interim placement of right upper extremity central venous catheter, difficult visualization of the tip due to overlying support leads. Tip appears to be over the cavoatrial region. Cardiomegaly with vascular congestion and small pleural effusions, not much interval change. Aortic atherosclerosis. No pneumothorax. IMPRESSION: Slightly difficult visualization of right upper extremity central venous catheter tip, it appears to project over the cavoatrial region. Continued cardiomegaly, vascular congestion, and small pleural effusions. Electronically Signed   By: Donavan Foil M.D.   On: 04/13/2021 21:28   DG Chest Port 1 View  Result Date: 04/13/2021 CLINICAL DATA:  Hypoxia. EXAM: PORTABLE CHEST 1 VIEW COMPARISON:  02/20/2021. FINDINGS: Prior CABG. Cardiomegaly. Mild bilateral interstitial prominence and small bilateral pleural effusions. Findings suggest CHF. Mild bibasilar atelectasis. No pneumothorax. IMPRESSION: 1. Prior CABG. Cardiomegaly with mild bilateral interstitial prominence and small bilateral pleural effusions. Findings suggest CHF. 2.  Low lung volumes with bibasilar atelectasis. Electronically Signed   By: Marcello Moores  Register   On: 04/13/2021 11:05   ECHOCARDIOGRAM  COMPLETE  Result Date: 04/13/2021    ECHOCARDIOGRAM REPORT   Patient Name:   Andrew [name] Date of Exam: 04/13/2021 Medical Rec #:  [id number]        Height:       67.0 in Accession #:    [id number]       Weight:       170.0 lb Date of Birth:  February 20, 1928        BSA:          1.887 m Patient Age:    83 years         BP:           101/76 mmHg Patient Gender: M                HR:           67 bpm. Exam Location:  Inpatient Procedure: 2D Echo, Cardiac Doppler, Color Doppler and Intracardiac            Opacification Agent Indications:    CHF-Acute Systolic Q65.78  History:        Patient has prior history of Echocardiogram examinations, most  recent 02/21/2021. CHF, CAD, COPD, Arrythmias:Atrial Fibrillation                 and Atrial Flutter, Signs/Symptoms:Shortness of Breath and                 Dyspnea; Risk Factors:Hypertension, Dyslipidemia and Diabetes.  Sonographer:    Bernadene Person RDCS Referring Phys: [id number] RHONDA G BARRETT  Sonographer Comments: Image acquisition challenging due to respiratory motion. IMPRESSIONS  1. Left ventricular ejection fraction, by estimation, is 25 to 30%. The left ventricle has severely decreased function. The left ventricle demonstrates global hypokinesis with mild dyskinesis of the left ventricular apex and disproportionately severe inferior/inferoseptal/inferolaeral hypokinesis. There is relatively preserved anterior septal and basal lateral wall contractility. Left ventricular diastolic parameters are consistent with Grade II diastolic dysfunction (pseudonormalization). Elevated left atrial pressure.  2. Right ventricular systolic function is moderately reduced. The right ventricular size is normal. There is normal pulmonary artery systolic pressure.  3. Left atrial size was severely dilated.  4. The mitral valve is degenerative. Mild mitral valve regurgitation. No evidence of mitral stenosis. Severe mitral annular calcification.  5. The aortic valve is tricuspid.  Aortic valve regurgitation is not visualized. Mild aortic valve sclerosis is present, with no evidence of aortic valve stenosis.  6. Aortic dilatation noted. There is borderline dilatation of the aortic root, measuring 38 mm.  7. The inferior vena cava is dilated in size with <50% respiratory variability, suggesting right atrial pressure of 15 mmHg. Comparison(s): No significant change from prior study. Prior images reviewed side by side. The rhythm appears to be sinus with very long PR interval and frequent PACs, versus periods of Mobitz type 1 second degree AV block. FINDINGS  Left Ventricle: There is no left ventricular thrombus with Definity. Left ventricular ejection fraction, by estimation, is 25 to 30%. The left ventricle has severely decreased function. The left ventricle demonstrates regional wall motion abnormalities.  Mild dyskinesis of the left ventricular, entire apical segment. Severe hypokinesis of the left ventricular, entire inferior wall, inferolateral wall and inferoseptal wall. Definity contrast agent was given IV to delineate the left ventricular endocardial borders. The left ventricular internal cavity size was normal in size. There is no left ventricular hypertrophy. Abnormal (paradoxical) septal motion consistent with post-operative status. Left ventricular diastolic function could not be evaluated due to atrial fibrillation. Left ventricular diastolic parameters are consistent with Grade II diastolic dysfunction (pseudonormalization). Elevated left atrial pressure.  LV Wall Scoring: The entire apex is dyskinetic. The inferior wall, posterior wall, mid inferoseptal segment, and basal inferoseptal segment are hypokinetic. The anterior wall, antero-lateral wall, and anterior septum are normal. Right Ventricle: The right ventricular size is normal. No increase in right ventricular wall thickness. Right ventricular systolic function is moderately reduced. There is normal pulmonary artery systolic  pressure. The tricuspid regurgitant velocity is 2.28 m/s, and with an assumed right atrial pressure of 15 mmHg, the estimated right ventricular systolic pressure is 61.4 mmHg. Left Atrium: Left atrial size was severely dilated. Right Atrium: Right atrial size was normal in size. Pericardium: There is no evidence of pericardial effusion. Mitral Valve: The mitral valve is degenerative in appearance. Severe mitral annular calcification. Mild mitral valve regurgitation. No evidence of mitral valve stenosis. Tricuspid Valve: The tricuspid valve is normal in structure. Tricuspid valve regurgitation is trivial. Aortic Valve: The aortic valve is tricuspid. Aortic valve regurgitation is not visualized. Mild aortic valve sclerosis is present, with no evidence of aortic valve stenosis. Pulmonic Valve: The pulmonic  valve was grossly normal. Pulmonic valve regurgitation is trivial. Aorta: Aortic dilatation noted. There is borderline dilatation of the aortic root, measuring 38 mm. Venous: The inferior vena cava is dilated in size with less than 50% respiratory variability, suggesting right atrial pressure of 15 mmHg. IAS/Shunts: No atrial level shunt detected by color flow Doppler.  LEFT VENTRICLE PLAX 2D LVIDd:         4.30 cm LVIDs:         3.10 cm LV PW:         1.10 cm LV IVS:        1.00 cm LVOT diam:     2.00 cm LV SV:         35 LV SV Index:   18 LVOT Area:     3.14 cm  LV Volumes (MOD) LV vol d, MOD A2C: 143.0 ml LV vol d, MOD A4C: 151.0 ml LV vol s, MOD A2C: 117.0 ml LV vol s, MOD A4C: 97.1 ml LV SV MOD A2C:     26.0 ml LV SV MOD A4C:     151.0 ml LV SV MOD BP:      40.9 ml RIGHT VENTRICLE TAPSE (M-mode): 1.1 cm LEFT ATRIUM             Index       RIGHT ATRIUM           Index LA diam:        5.20 cm 2.76 cm/m  RA Area:     14.40 cm LA Vol (A2C):   75.8 ml 40.16 ml/m RA Volume:   32.00 ml  16.96 ml/m LA Vol (A4C):   76.4 ml 40.48 ml/m LA Biplane Vol: 80.8 ml 42.81 ml/m  AORTIC VALVE LVOT Vmax:   58.85 cm/s LVOT  Vmean:  41.900 cm/s LVOT VTI:    0.111 m  AORTA Ao Root diam: 3.80 cm Ao Asc diam:  3.20 cm MITRAL VALVE            TRICUSPID VALVE MV Area (PHT): 5.04 cm TR Peak grad:   20.8 mmHg MV Decel Time: 151 msec TR Vmax:        228.00 cm/s                          SHUNTS                         Systemic VTI:  0.11 m                         Systemic Diam: 2.00 cm Dani Gobble Croitoru MD Electronically signed by Sanda Klein MD Signature Date/Time: 04/13/2021/3:00:16 PM    Final    [place] EKG SITE RITE  Result Date: 04/13/2021 If Site Rite image not attached, placement could not be confirmed due to current cardiac rhythm.  [place] EKG SITE RITE  Result Date: 04/13/2021 If Site Rite image not attached, placement could not be confirmed due to current cardiac rhythm.     Medications:     Scheduled Medications: . amiodarone  100 mg Oral Daily  . atorvastatin  80 mg Oral QPM  . Chlorhexidine Gluconate Cloth  6 each Topical Daily  . ezetimibe  10 mg Oral Daily  . FLUoxetine  20 mg Oral Daily  . gabapentin  100 mg Oral TID  . levothyroxine  100 mcg Oral QAC breakfast  . sodium  chloride flush  10-40 mL Intracatheter Q12H     Infusions: . cefTRIAXone (ROCEPHIN)  IV 1 g (04/13/21 2254)  . heparin 950 Units/hr (04/13/21 1608)  . milrinone 0.25 mcg/kg/min (04/14/21 0055)  . [START ON 04/15/2021] vancomycin       PRN Medications:  acetaminophen, nitroGLYCERIN, ondansetron (ZOFRAN) IV, sodium chloride flush     Assessment/Plan   1. ID: Possible sepsis syndrome from lower extremity cellulitis, has not been compliant with wound clinic.  Plain films of LEs negative for osteomyelitis.   - abx per TRH, on vanc + ceftriaxone   - Blood cultures NGTD.  - Will send procalcitonin.  - Hydralazine/Imdur on hold with soft BP.  2. Acute on Chronic Systolic CHF: Ischemic cardiomyopathy: EF 40-45% on 3/18 echo. Echo 8/20 with EF 45-50%, normal RV. Echo in 9/21 with EF 40-45%, mild mitral stenosis.Recent admit 2/22  for a/c CHF. Repeat echo EF lower, 25-30%, RV ok. Cath declined due to age and lack of ischemic CP. Now readmitted for a/c CHF w/ marked fluid overload and ?low output. BNP >4,500. SCr 2.32 (baseline 1.6). Possible ACS, HS trop 1,562>>1,945>>4008. Echo this admit unchanged EF 25-30% + RWMA (LV global hypokinesis with mild dyskinesis of the left  ventricular apex and disproportionately severe inferior/inferoseptal/inferolaeral hypokinesis), RV moderately reduced. He appeared volume overloaded yesterday and and got IV Lasix without much UOP overnight. PICC placed, co-ox mildly low at 57% and milrinone 0.25 mcg/kg/min begun with co-ox up to 65% today.  CVP 8-9 on my read this morning, creatinine up to 2.91.  - No plans for coronary angiography at this point given advanced age and AKI, no chest pain.  - With rise in creatinine and CVP not markedly high, hold Lasix today.  - Continue milrinone 0.25 mcg/kg/min for now.  - hold Jardiance w/ AKI - No Entresto/ spiro/ dig w/ AKI - Holding hydralazine/Imdur.  - not candidate for advanced therapies given advanced age  87. CAD/ NSTEMI: Has super-dominant RCA. s/p CABG 1992. He had Taxus DES to mid PDA in 5/02. LHC (1/05) with 90% ostial PDA (had DES to this vessel), totally occluded RIMA-PDA, totally occluded PLV, sequential SVG-PLV with 1 branch occluded. LHC (11/15) with left main off the right cusp, 40-50% ostial left main, total occlusion of the proximal LAD (small vessel) with right to left collaterals, large super-dominant RCA with 50% ISR mid RCA, 50% ostial PDA, patent SVG-PLV, RIMA-PDA known atretic.EF dropped on recent echo 2/22 down to 25-30% however no plans for ischemic w/u given lack of CP and advanced age. Pt also declining invasive procedures. Now readmitted for a/c CHF + abdominal pain. HS trop 1,562>>1,945>>4008.  No chest pain. EKG showed NSR, 83 bpm, w/ nonspecific IVCD with LAD. Echo w/ RWMAs as outlined above. Possible NSTEMI with significant  rise in HS-TnI.  - No plans for coronary angiography given advanced age and AKI, no chest pain.   - continue medical therapy w/ IV heparin + statin.  - no ? blocker w/ ?low output and h/o bradycardia  - ASA 81 4. AKI on Stage III CKD: baseline SCr ~1.6. 2.31 on admit, in setting of sepsis and ?low output + fluid overload. Creatinine up to 2.9 today with CVP 8-9.  - Hold Lasix today and continue milrinone 0.25 to optimize cardiac output.  - follow BMP  5. PAD: Severe bilateral SFA disease on 8/21 peripheral angiogram, no revascularization options. Leg ulcers now healed, no longer going to wound clinic. Minimal claudication.Cilostazol recently discontinued 2/22 given CHF.  6. Paroxysmal Atrial fibrillation/flutter:  Appears to be in AF this admission. Rate controlled. Off ? blocker due to bradycardia - Eliquis on hold. Continue IV heparin for NSTEMI.   - Continue home amiodarone 100 mg daily for now.  7. COPD: followed by pulmonology   8. 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 He has had resulting imbalance.  - He is no longer driving.  9. Hypothyroidism: continue levothyroxine  10. Pulmonary nodule: 1.3 cm RML nodule on 1/19 CT chest. It was also seen in 2014 =>slow growing. Dr [place] Bells has discussed with patient and wife and they have decided not to go forward with biopsy. They would not be interested in lung cancer treatment. CT chest in 7/20 showed stable 1 cm RML nodule.  11. Delirium: Suspect due to medical illness.  Re-orient, treat HF and suspected infection.   Length of Stay: 1  Loralie Champagne, MD  04/14/2021, 9:25 AM  Advanced Heart Failure Team Pager [phone] (M-F; 7a - 5p)  Please contact [facility] for night-coverage after hours (5p -7a ) and weekends on [email or web address]

## 2021-04-14 NOTE — Progress Notes (Signed)
ANTICOAGULATION + VANCOMYCIN CONSULT NOTE - Follow Up Consult  Pharmacy Consult for Heparin; Vancomycin Indication: NSTEMI; cellulitis  Allergies  Allergen Reactions  . Metoprolol Shortness Of Breath, Palpitations and Other (See Comments)    Heart starts racing. Shallow breathing; pt states he also get skin irritation on his legs  . Metformin Hives and Swelling    On legs  . Metformin And Related Nausea And Vomiting    Patient Measurements: Height: 5\' 7"  (170.2 cm) Weight: 78.2 kg (172 lb 6.4 oz) IBW/kg (Calculated) : 66.1 Heparin Dosing Weight: 78.2 kg  Vital Signs: Temp: 98.5 F (36.9 C) (04/20 0400) Temp Source: Oral (04/20 0400) BP: 113/57 (04/20 0400) Pulse Rate: 77 (04/20 0400)  Labs: Recent Labs    04/13/21 0124 04/13/21 1051 04/13/21 1228 04/13/21 1516 04/13/21 1654 04/13/21 1926 04/13/21 2332 04/14/21 0154 04/14/21 0155 04/14/21 0455 04/14/21 1249  HGB 11.4*  --   --   --   --   --   --   --   --  10.2*  --   HCT 36.6*  --   --   --   --   --   --   --   --  33.0*  --   PLT 203  --   --   --   --   --   --   --   --  170  --   APTT  --   --   --  33  --   --   --  79*  --   --  62*  HEPARINUNFRC  --   --   --  0.81*  --   --   --   --  0.91*  --  0.82*  CREATININE 2.31*  --   --   --   --   --  2.82*  --   --  2.91*  --   TROPONINIHS  --    < > 1,945*  --  3,107* 4,008*  --   --   --   --   --    < > = values in this interval not displayed.    Estimated Creatinine Clearance: 15.8 mL/min (A) (by C-G formula based on SCr of 2.91 mg/dL (H)).  Assessment:  Patient is a 87 y.o M with hx CAD (s/p CABG), ischemic cardiomyopathy, CVA and afib on Eliquis PTA, presented to the ED on 4/19 with c/o abdominal pain and decreased appetite.  He was found to have diffuse erythema of the LE and elevated troponin.  Pharmacy  consulted for heparin drip for NSTEMI and Vancomycin for cellulitis.  Heparin level 0.82 and aPTT 62 seconds on Heparin 950 units/hr. APTT just  below target range.  Last Eliquis dose 4/17 at 6pm.  Using aPTTs for heparin monitoring while heparin levels are falsely elevated from recent Eliquis doses.  APTT trended down from 79 seconds overnight.  Noted not planning cardiac cath.  No chest pain noted. Expecting ~48hrs IV heparin.     Day #2 Vancomycin and Ceftriaxone for cellulitis.   Vancomycin 1250 mg IV loading dose given 4/19 and Vanc 1gm IV q48h to begin 4/21.   Creatinine has trended up from 2.31 > 2.91. Holding Lasix today.   Goal of Therapy:  Heparin level 0.3-0.7 units/ml aPTT 66-102 seconds Monitor platelets by anticoagulation protocol: Yes  Vancomycin: Goal AUC 400-550.   Plan:   Increase heparin drip to 1050 units/hr.  Next aPTT, heparin level and CBC  in am.  Eliquis on hold.  Continue Vancomycin 1gm IV q48h > dose due 4/21 am  Will follow renal function for any need to adjust vanc regimen.  Also on Ceftriaxone 1gm IV q24h.  Arty Baumgartner, RPh 04/14/2021,1:24 PM

## 2021-04-14 NOTE — Progress Notes (Signed)
PROGRESS NOTE    Andrew [name]  BSW:[id number] DOB: 10/19/1928 DOA: 04/13/2021 PCP: [name], MD   Brief Narrative:  Andrew [name] is a 87 y.o. male with medical history significant of combind HF, a fib on eliquis, hypothyroidism, HLD, PAD. Presenting with abdominal pain. History per wife as patient is a poor historian. Patient was originally seen this morning for abdominal pain. His wife reports that he's had RUQ ab pain for several months. It comes in waves. He's not seen a physician about it. He just dealt with it. It has apparently become worse over the last couple of days. The wife also noted that the patient has had a worsening of his leg wounds since he stopped going to the wound care center. He hasn't been to the center in at least 3 weeks. She says during that time, his legs have become more red and painful. He is not able to walk on his own at this point. She states that he stopped going just because he was tired of the hassle. She denies any other aggravating or alleviating factors. In ED: CT of abdomen and pelvis was revealing for cholelithiasis w/o acute inflammation. XR BLE was not concerning for osteo. CXR was concerning for CHF. He was found to have elevated tropinins and BNP. He was given vanc. Cardiology was consulted. [facility] was called for admission.   Assessment & Plan:   Active Problems:   Acute on chronic systolic CHF (congestive heart failure) (HCC)   Acute on chronic systolic congestive heart failure (HCC)   Cellulitis of left lower extremity   Sepsis secondary to BLE cellulitis, POA -Continue vancomycin, ceftriaxone -Follow cultures -rule out dissemination of infection -Wound care following -Sepsis criteria resolved -Procalcitonin markedly positive  Elevated troponin in setting of sepsis, AKI and likely hypovolemic hypotension Cannot rule out concurrent acute on chronic combined HF -Troponin uptrending, cardiology consulted appreciate insert  recommendations -BNP markedly elevated in the setting of AKI - last echo 01/2021 showed EF 25% w/ global hypokinesis -Continue heparin gtt -holding home Eliquis  AKI on CKD3b -Likely in the setting of sepsis, poor p.o. intake hypovolemia, and likely poor perfusion given heart failure  -Continue to monitor closely  Chronic hypoxic respiratory failure on 3L [place] - normally on 2 - 3 L [place] at night only - currently maintain sats on home dosing; follow  Hypothyroidism     - continue synthroid  PAF on eliquis -Blood pressure downtrending overnight, hold carvedilol -Transition from Eliquis to heparin as above  Hx of HTN - pressures are soft, hold home regimen  Hx of PAD, HLD     - continue zetia, lipitior  Previously reported abdominal pain, unspecified -Per previous facility patient had presented with abdominal pain, denies any abdominal pain or issues at this point -CT shows cholelithiasis without any inflammation, abdomen benign on exam  DVT prophylaxis: Heparin Code Status: Full Family Communication: None present  Status is: Inpatient  Dispo: The patient is from: Home              Anticipated d/c is to: To be determined              Anticipated d/c date is: 72 hours              Patient currently not medically stable for discharge  Consultants:   Cardiology  Procedures:   None  Antimicrobials:  Vancomycin, ceftriaxone initiated 04/13/2021  Subjective: No acute issues or events overnight, somewhat somnolent this morning  and minimally arousable, review of systems markedly limited.  Objective: Vitals:   04/14/21 0000 04/14/21 0034 04/14/21 0200 04/14/21 0400  BP: 97/60  (!) 108/57 (!) 113/57  Pulse: 69  71 77  Resp: 16  13 18   Temp: 98.6 F (37 C)  99.3 F (37.4 C) 98.5 F (36.9 C)  TempSrc: Oral  Oral Oral  SpO2: 99%  98% 98%  Weight:  78.2 kg    Height:        Intake/Output Summary (Last 24 hours) at 04/14/2021 0726 Last data filed at 04/14/2021  0600 Gross per 24 hour  Intake 1715.14 ml  Output 275 ml  Net 1440.14 ml   Filed Weights   04/13/21 0022 04/13/21 1459 04/14/21 0034  Weight: 77.1 kg 75.2 kg 78.2 kg    Examination:  General exam: Appears calm and comfortable; somnolent, poorly interactive Respiratory system: Clear to auscultation. Respiratory effort normal. Cardiovascular system: S1 & S2 heard, RRR. No JVD, murmurs, rubs, gallops or clicks. No pedal edema. Gastrointestinal system: Abdomen is nondistended, soft and nontender. No organomegaly or masses felt. Normal bowel sounds heard. Central nervous system: Somnolent, no focal neurological deficits on limited exam. Skin: No rashes   Data Reviewed: I have personally reviewed following labs and imaging studies  CBC: Recent Labs  Lab 04/13/21 0124 04/14/21 0455  WBC 14.6* 16.7*  NEUTROABS 12.5* 14.1*  HGB 11.4* 10.2*  HCT 36.6* 33.0*  MCV 95.1 95.7  PLT 203 354   Basic Metabolic Panel: Recent Labs  Lab 04/13/21 0124 04/13/21 2332 04/14/21 0455  NA 141 137 139  K 4.5 4.3 4.3  CL 106 104 104  CO2 23 22 22   GLUCOSE 144* 94 98  BUN 70* 74* 81*  CREATININE 2.31* 2.82* 2.91*  CALCIUM 8.9 8.4* 8.5*   GFR: Estimated Creatinine Clearance: 15.8 mL/min (A) (by C-G formula based on SCr of 2.91 mg/dL (H)). Liver Function Tests: Recent Labs  Lab 04/13/21 0124 04/14/21 0455  AST 40 101*  ALT 33 76*  ALKPHOS 92 77  BILITOT 1.1 1.1  PROT 6.8 5.8*  ALBUMIN 3.3* 2.7*   Recent Labs  Lab 04/13/21 0124  LIPASE 58*   No results for input(s): AMMONIA in the last 168 hours. Coagulation Profile: No results for input(s): INR, PROTIME in the last 168 hours. Cardiac Enzymes: No results for input(s): CKTOTAL, CKMB, CKMBINDEX, TROPONINI in the last 168 hours. BNP (last 3 results) No results for input(s): PROBNP in the last 8760 hours. HbA1C: No results for input(s): HGBA1C in the last 72 hours. CBG: Recent Labs  Lab 04/13/21 1530  GLUCAP 92   Lipid  Profile: No results for input(s): CHOL, HDL, LDLCALC, TRIG, CHOLHDL, LDLDIRECT in the last 72 hours. Thyroid Function Tests: No results for input(s): TSH, T4TOTAL, FREET4, T3FREE, THYROIDAB in the last 72 hours. Anemia Panel: No results for input(s): VITAMINB12, FOLATE, FERRITIN, TIBC, IRON, RETICCTPCT in the last 72 hours. Sepsis Labs: No results for input(s): PROCALCITON, LATICACIDVEN in the last 168 hours.  Recent Results (from the past 240 hour(s))  Resp Panel by RT-PCR (Flu A&B, Covid) Nasopharyngeal Swab     Status: None   Collection Time: 04/13/21 10:59 AM   Specimen: Nasopharyngeal Swab; Nasopharyngeal(NP) swabs in vial transport medium  Result Value Ref Range Status   SARS Coronavirus 2 by RT PCR NEGATIVE NEGATIVE Final    Comment: (NOTE) SARS-CoV-2 target nucleic acids are NOT DETECTED.  The SARS-CoV-2 RNA is generally detectable in upper respiratory specimens during the acute phase  of infection. The lowest concentration of SARS-CoV-2 viral copies this assay can detect is 138 copies/mL. A negative result does not preclude SARS-Cov-2 infection and should not be used as the sole basis for treatment or other patient management decisions. A negative result may occur with  improper specimen collection/handling, submission of specimen other than nasopharyngeal swab, presence of viral mutation(s) within the areas targeted by this assay, and inadequate number of viral copies(<138 copies/mL). A negative result must be combined with clinical observations, patient history, and epidemiological information. The expected result is Negative.  Fact Sheet for Patients:  EntrepreneurPulse.com.au  Fact Sheet for Healthcare Providers:  IncredibleEmployment.be  This test is no t yet approved or cleared by the [place] FDA and  has been authorized for detection and/or diagnosis of SARS-CoV-2 by FDA under an Emergency Use Authorization (EUA). This EUA  will remain  in effect (meaning this test can be used) for the duration of the COVID-19 declaration under Section 564(b)(1) of the Act, 21 U.S.C.section 360bbb-3(b)(1), unless the authorization is terminated  or revoked sooner.       Influenza A by PCR NEGATIVE NEGATIVE Final   Influenza B by PCR NEGATIVE NEGATIVE Final    Comment: (NOTE) The Xpert Xpress SARS-CoV-2/FLU/RSV plus assay is intended as an aid in the diagnosis of influenza from Nasopharyngeal swab specimens and should not be used as a sole basis for treatment. Nasal washings and aspirates are unacceptable for Xpert Xpress SARS-CoV-2/FLU/RSV testing.  Fact Sheet for Patients: EntrepreneurPulse.com.au  Fact Sheet for Healthcare Providers: IncredibleEmployment.be  This test is not yet approved or cleared by the [place] FDA and has been authorized for detection and/or diagnosis of SARS-CoV-2 by FDA under an Emergency Use Authorization (EUA). This EUA will remain in effect (meaning this test can be used) for the duration of the COVID-19 declaration under Section 564(b)(1) of the Act, 21 U.S.C. section 360bbb-3(b)(1), unless the authorization is terminated or revoked.  Performed at [facility], [place] [name] [place], [place] [address]       Radiology Studies: CT ABDOMEN PELVIS WO CONTRAST  Result Date: 04/13/2021 CLINICAL DATA:  Nausea, vomiting and diarrhea EXAM: CT ABDOMEN AND PELVIS WITHOUT CONTRAST TECHNIQUE: Multidetector CT imaging of the abdomen and pelvis was performed following the standard protocol without IV contrast. COMPARISON:  05/31/2019 FINDINGS: LOWER CHEST: Small pleural effusions. HEPATOBILIARY: Normal hepatic contours. No intra- or extrahepatic biliary dilatation. There is cholelithiasis without acute inflammation. Small volume ascites in the upper abdomen. PANCREAS: Normal pancreas. No ductal dilatation or peripancreatic fluid  collection. SPLEEN: Normal. ADRENALS/URINARY TRACT: The adrenal glands are normal. Multiple unchanged renal cysts. The largest on the right measures 4.2 cm in the largest on the left measures 6.6 cm. The urinary bladder is normal for degree of distention STOMACH/BOWEL: There is no hiatal hernia. Normal duodenal course and caliber. No small bowel dilatation or inflammation. No focal colonic abnormality. Normal appendix. VASCULAR/LYMPHATIC: There is calcific atherosclerosis of the abdominal aorta. Remote aortic graft repair. No lymphadenopathy. REPRODUCTIVE: There are calcifications within the normal-sized prostate. Symmetric seminal vesicles. MUSCULOSKELETAL. No bony spinal canal stenosis or focal osseous abnormality. OTHER: None. IMPRESSION: 1. No acute abnormality of the abdomen or pelvis. 2. Small volume ascites and small pleural effusions. 3. Cholelithiasis without acute inflammation. Electronically Signed   By: Ulyses Jarred M.D.   On: 04/13/2021 03:37   DG Tibia/Fibula Left  Result Date: 04/13/2021 CLINICAL DATA:  Leg ulcer EXAM: LEFT TIBIA AND FIBULA - 2 VIEW COMPARISON:  None.  FINDINGS: There is no evidence of fracture or other focal bone lesions. No focal erosion or periosteal elevation. Extensive vascular calcifications. Mild diffuse soft tissue swelling. IMPRESSION: 1. No acute osseous abnormality in the left tibia or fibula. 2. Mild diffuse soft tissue swelling. 3. Extensive vascular calcifications. Electronically Signed   By: Davina Poke D.O.   On: 04/13/2021 11:04   DG Tibia/Fibula Right  Result Date: 04/13/2021 CLINICAL DATA:  Leg ulcers.  Evaluate for osteomyelitis. EXAM: RIGHT TIBIA AND FIBULA - 2 VIEW COMPARISON:  Right knee radiographs 07/03/2020. Right tibia/fibula radiographs 09/15/2015. FINDINGS: No acute fracture is identified. The knee and ankle appear located. No interval osseous erosion is seen to indicate osteomyelitis. There is mild medial compartment joint space narrowing at  the knee. Patellar and calcaneal enthesophytes are noted. There are surgical clips in the lower leg with atherosclerotic vascular calcifications and soft tissue swelling. IMPRESSION: No evidence of osteomyelitis. Electronically Signed   By: Logan Bores M.D.   On: 04/13/2021 11:03   DG CHEST PORT 1 VIEW  Result Date: 04/13/2021 CLINICAL DATA:  PICC line placement EXAM: PORTABLE CHEST 1 VIEW COMPARISON:  04/13/2021, 02/20/2021 CT 07/03/2019 FINDINGS: Post sternotomy changes. Interim placement of right upper extremity central venous catheter, difficult visualization of the tip due to overlying support leads. Tip appears to be over the cavoatrial region. Cardiomegaly with vascular congestion and small pleural effusions, not much interval change. Aortic atherosclerosis. No pneumothorax. IMPRESSION: Slightly difficult visualization of right upper extremity central venous catheter tip, it appears to project over the cavoatrial region. Continued cardiomegaly, vascular congestion, and small pleural effusions. Electronically Signed   By: Donavan Foil M.D.   On: 04/13/2021 21:28   DG Chest Port 1 View  Result Date: 04/13/2021 CLINICAL DATA:  Hypoxia. EXAM: PORTABLE CHEST 1 VIEW COMPARISON:  02/20/2021. FINDINGS: Prior CABG. Cardiomegaly. Mild bilateral interstitial prominence and small bilateral pleural effusions. Findings suggest CHF. Mild bibasilar atelectasis. No pneumothorax. IMPRESSION: 1. Prior CABG. Cardiomegaly with mild bilateral interstitial prominence and small bilateral pleural effusions. Findings suggest CHF. 2.  Low lung volumes with bibasilar atelectasis. Electronically Signed   By: Marcello Moores  Register   On: 04/13/2021 11:05   ECHOCARDIOGRAM COMPLETE  Result Date: 04/13/2021    ECHOCARDIOGRAM REPORT   Patient Name:   Andrew [name] Date of Exam: 04/13/2021 Medical Rec #:  [id number]        Height:       67.0 in Accession #:    [id number]       Weight:       170.0 lb Date of Birth:  July 06, 1928         BSA:          1.887 m Patient Age:    13 years         BP:           101/76 mmHg Patient Gender: M                HR:           67 bpm. Exam Location:  Inpatient Procedure: 2D Echo, Cardiac Doppler, Color Doppler and Intracardiac            Opacification Agent Indications:    CHF-Acute Systolic J68.11  History:        Patient has prior history of Echocardiogram examinations, most                 recent 02/21/2021. CHF, CAD, COPD, Arrythmias:Atrial Fibrillation  and Atrial Flutter, Signs/Symptoms:Shortness of Breath and                 Dyspnea; Risk Factors:Hypertension, Dyslipidemia and Diabetes.  Sonographer:    Bernadene Person RDCS Referring Phys: [id number] RHONDA G BARRETT  Sonographer Comments: Image acquisition challenging due to respiratory motion. IMPRESSIONS  1. Left ventricular ejection fraction, by estimation, is 25 to 30%. The left ventricle has severely decreased function. The left ventricle demonstrates global hypokinesis with mild dyskinesis of the left ventricular apex and disproportionately severe inferior/inferoseptal/inferolaeral hypokinesis. There is relatively preserved anterior septal and basal lateral wall contractility. Left ventricular diastolic parameters are consistent with Grade II diastolic dysfunction (pseudonormalization). Elevated left atrial pressure.  2. Right ventricular systolic function is moderately reduced. The right ventricular size is normal. There is normal pulmonary artery systolic pressure.  3. Left atrial size was severely dilated.  4. The mitral valve is degenerative. Mild mitral valve regurgitation. No evidence of mitral stenosis. Severe mitral annular calcification.  5. The aortic valve is tricuspid. Aortic valve regurgitation is not visualized. Mild aortic valve sclerosis is present, with no evidence of aortic valve stenosis.  6. Aortic dilatation noted. There is borderline dilatation of the aortic root, measuring 38 mm.  7. The inferior vena cava is dilated  in size with <50% respiratory variability, suggesting right atrial pressure of 15 mmHg. Comparison(s): No significant change from prior study. Prior images reviewed side by side. The rhythm appears to be sinus with very long PR interval and frequent PACs, versus periods of Mobitz type 1 second degree AV block. FINDINGS  Left Ventricle: There is no left ventricular thrombus with Definity. Left ventricular ejection fraction, by estimation, is 25 to 30%. The left ventricle has severely decreased function. The left ventricle demonstrates regional wall motion abnormalities.  Mild dyskinesis of the left ventricular, entire apical segment. Severe hypokinesis of the left ventricular, entire inferior wall, inferolateral wall and inferoseptal wall. Definity contrast agent was given IV to delineate the left ventricular endocardial borders. The left ventricular internal cavity size was normal in size. There is no left ventricular hypertrophy. Abnormal (paradoxical) septal motion consistent with post-operative status. Left ventricular diastolic function could not be evaluated due to atrial fibrillation. Left ventricular diastolic parameters are consistent with Grade II diastolic dysfunction (pseudonormalization). Elevated left atrial pressure.  LV Wall Scoring: The entire apex is dyskinetic. The inferior wall, posterior wall, mid inferoseptal segment, and basal inferoseptal segment are hypokinetic. The anterior wall, antero-lateral wall, and anterior septum are normal. Right Ventricle: The right ventricular size is normal. No increase in right ventricular wall thickness. Right ventricular systolic function is moderately reduced. There is normal pulmonary artery systolic pressure. The tricuspid regurgitant velocity is 2.28 m/s, and with an assumed right atrial pressure of 15 mmHg, the estimated right ventricular systolic pressure is 78.6 mmHg. Left Atrium: Left atrial size was severely dilated. Right Atrium: Right atrial size was  normal in size. Pericardium: There is no evidence of pericardial effusion. Mitral Valve: The mitral valve is degenerative in appearance. Severe mitral annular calcification. Mild mitral valve regurgitation. No evidence of mitral valve stenosis. Tricuspid Valve: The tricuspid valve is normal in structure. Tricuspid valve regurgitation is trivial. Aortic Valve: The aortic valve is tricuspid. Aortic valve regurgitation is not visualized. Mild aortic valve sclerosis is present, with no evidence of aortic valve stenosis. Pulmonic Valve: The pulmonic valve was grossly normal. Pulmonic valve regurgitation is trivial. Aorta: Aortic dilatation noted. There is borderline dilatation of the aortic root, measuring 38  mm. Venous: The inferior vena cava is dilated in size with less than 50% respiratory variability, suggesting right atrial pressure of 15 mmHg. IAS/Shunts: No atrial level shunt detected by color flow Doppler.  LEFT VENTRICLE PLAX 2D LVIDd:         4.30 cm LVIDs:         3.10 cm LV PW:         1.10 cm LV IVS:        1.00 cm LVOT diam:     2.00 cm LV SV:         35 LV SV Index:   18 LVOT Area:     3.14 cm  LV Volumes (MOD) LV vol d, MOD A2C: 143.0 ml LV vol d, MOD A4C: 151.0 ml LV vol s, MOD A2C: 117.0 ml LV vol s, MOD A4C: 97.1 ml LV SV MOD A2C:     26.0 ml LV SV MOD A4C:     151.0 ml LV SV MOD BP:      40.9 ml RIGHT VENTRICLE TAPSE (M-mode): 1.1 cm LEFT ATRIUM             Index       RIGHT ATRIUM           Index LA diam:        5.20 cm 2.76 cm/m  RA Area:     14.40 cm LA Vol (A2C):   75.8 ml 40.16 ml/m RA Volume:   32.00 ml  16.96 ml/m LA Vol (A4C):   76.4 ml 40.48 ml/m LA Biplane Vol: 80.8 ml 42.81 ml/m  AORTIC VALVE LVOT Vmax:   58.85 cm/s LVOT Vmean:  41.900 cm/s LVOT VTI:    0.111 m  AORTA Ao Root diam: 3.80 cm Ao Asc diam:  3.20 cm MITRAL VALVE            TRICUSPID VALVE MV Area (PHT): 5.04 cm TR Peak grad:   20.8 mmHg MV Decel Time: 151 msec TR Vmax:        228.00 cm/s                          SHUNTS                          Systemic VTI:  0.11 m                         Systemic Diam: 2.00 cm Dani Gobble Croitoru MD Electronically signed by Sanda Klein MD Signature Date/Time: 04/13/2021/3:00:16 PM    Final    [place] EKG SITE RITE  Result Date: 04/13/2021 If Site Rite image not attached, placement could not be confirmed due to current cardiac rhythm.  [place] EKG SITE RITE  Result Date: 04/13/2021 If Site Rite image not attached, placement could not be confirmed due to current cardiac rhythm.   Scheduled Meds: . amiodarone  100 mg Oral Daily  . atorvastatin  80 mg Oral QPM  . Chlorhexidine Gluconate Cloth  6 each Topical Daily  . ezetimibe  10 mg Oral Daily  . FLUoxetine  20 mg Oral Daily  . furosemide  80 mg Intravenous BID  . gabapentin  100 mg Oral TID  . levothyroxine  100 mcg Oral QAC breakfast  . sodium chloride flush  10-40 mL Intracatheter Q12H   Continuous Infusions: . cefTRIAXone (ROCEPHIN)  IV 1 g (04/13/21 2254)  .  heparin 950 Units/hr (04/13/21 1608)  . milrinone 0.25 mcg/kg/min (04/14/21 0055)  . [START ON 04/15/2021] vancomycin       LOS: 1 day   Time spent: 26min  Aliea Bobe C Jameir Ake, DO [facility]  If 7PM-7AM, please contact night-coverage [email or web address]  04/14/2021, 7:26 AM

## 2021-04-14 NOTE — Progress Notes (Signed)
   04/14/21 0920  Mobility  Activity Contraindicated/medical hold (Per RN hold due to lethargy)

## 2021-04-14 NOTE — Progress Notes (Signed)
Lasix held earlier today for rising SCr. CVP rechecked and elevated at 13-14. He is coughing. Will start back on IV Lasix 80 mg bid. Give dose now. Next dose in AM.   Lyda Jester, PA-C

## 2021-04-15 ENCOUNTER — Inpatient Hospital Stay (HOSPITAL_COMMUNITY): Payer: Medicare PPO

## 2021-04-15 DIAGNOSIS — I5023 Acute on chronic systolic (congestive) heart failure: Secondary | ICD-10-CM | POA: Diagnosis not present

## 2021-04-15 LAB — BASIC METABOLIC PANEL
Anion gap: 9 (ref 5–15)
BUN: 89 mg/dL — ABNORMAL HIGH (ref 8–23)
CO2: 24 mmol/L (ref 22–32)
Calcium: 8.3 mg/dL — ABNORMAL LOW (ref 8.9–10.3)
Chloride: 103 mmol/L (ref 98–111)
Creatinine, Ser: 3.17 mg/dL — ABNORMAL HIGH (ref 0.61–1.24)
GFR, Estimated: 18 mL/min — ABNORMAL LOW (ref >60)
Glucose, Bld: 142 mg/dL — ABNORMAL HIGH (ref 70–99)
Potassium: 4.3 mmol/L (ref 3.5–5.1)
Sodium: 136 mmol/L (ref 135–145)

## 2021-04-15 LAB — GLUCOSE, CAPILLARY
Glucose-Capillary: 140 mg/dL — ABNORMAL HIGH (ref 70–99)
Glucose-Capillary: 205 mg/dL — ABNORMAL HIGH (ref 70–99)
Glucose-Capillary: 161 mg/dL — ABNORMAL HIGH (ref 70–99)
Glucose-Capillary: 143 mg/dL — ABNORMAL HIGH (ref 70–99)

## 2021-04-15 LAB — CBC
HCT: 31.2 % — ABNORMAL LOW (ref 39.0–52.0)
Hemoglobin: 9.6 g/dL — ABNORMAL LOW (ref 13.0–17.0)
MCH: 29.2 pg (ref 26.0–34.0)
MCHC: 30.8 g/dL (ref 30.0–36.0)
MCV: 94.8 fL (ref 80.0–100.0)
Platelets: 173 10*3/uL (ref 150–400)
RBC: 3.29 MIL/uL — ABNORMAL LOW (ref 4.22–5.81)
RDW: 14.9 % (ref 11.5–15.5)
WBC: 18.9 10*3/uL — ABNORMAL HIGH (ref 4.0–10.5)
nRBC: 0 % (ref 0.0–0.2)

## 2021-04-15 LAB — HEPARIN LEVEL (UNFRACTIONATED): Heparin Unfractionated: 0.63 IU/mL (ref 0.30–0.70)

## 2021-04-15 LAB — COOXEMETRY PANEL
Carboxyhemoglobin: 1.4 % (ref 0.5–1.5)
Methemoglobin: 1.1 % (ref 0.0–1.5)
O2 Saturation: 63.3 %
Total hemoglobin: 10 g/dL — ABNORMAL LOW (ref 12.0–16.0)

## 2021-04-15 LAB — APTT: aPTT: 43 seconds — ABNORMAL HIGH (ref 24–36)

## 2021-04-15 MED ORDER — SILVER SULFADIAZINE 1 % EX CREA
TOPICAL_CREAM | Freq: Every day | CUTANEOUS | Status: DC
  Filled 2021-04-15: qty 85

## 2021-04-15 NOTE — Consult Note (Signed)
[facility] Nurse Consult Note: Patient receiving care in [facility]. Reason for Consult: BLE wouds Wound type: per note from Dr. Dellia Nims in wound care center, both venous insufficiency and PAD Pressure Injury POA: Yes/No/NA Measurement: Wound bed: see images from 04/13/21 Drainage (amount, consistency, odor)  Periwound: erythematous Dressing procedure/placement/frequency:  Daily cleansing with soap and water, application of Silvadene to open wounds, Sween Moisturizing Ointment to intact skin. Cover wounds with Vaseline Gauzes, then beginning behind the toes and going to just below the knees, spiral wrap kerlex, then 4 inch ace wrap. Perform daily.  Per the notes from this hospitalization, the patient stopped going to the [facility].  Last visit appears to have been 3/18.  The patient should resume routine visits to the [facility] upon discharge if he wishes to maintain his leg wounds in optimal condition. [facility] nurse will not follow at this time.  Please re-consult the [facility] team if needed.  Val Riles, RN, MSN, CWOCN, CNS-BC, pager [phone]

## 2021-04-15 NOTE — Progress Notes (Addendum)
Patient ID: Andrew [name], male   DOB: 1928/09/18, 87 y.o.   MRN: [id number]     Advanced Heart Failure Rounding Note  PCP-Cardiologist: Loralie Champagne, MD   Subjective:    Awake and alert this morning, able to tell me that he is in a hospital.  No co-ox yet this morning, he remains on milrinone 0.25.  BP stable.  CVP 14-15 this morning and weight up.  He was started on IV Lasix yesterday pm, 650 cc UOP recorded.  Creatinine 2.8 => 2.91 => 3.17.    Afebrile, WBCs up to 18.9.  PCT 3.61 but in setting of AKI.  He is on vancomycin/ceftriaxone for cellulitis.   He appears to be in rate-controlled atrial fibrillation currently.    Objective:   Weight Range: 81.2 kg Body mass index is 28.04 kg/m.   Vital Signs:   Temp:  [98.6 F (37 C)-98.8 F (37.1 C)] 98.6 F (37 C) (04/21 0743) Pulse Rate:  [71-81] 76 (04/21 0400) Resp:  [16-20] 19 (04/21 0700) BP: (103-118)/(51-74) 104/51 (04/21 0600) SpO2:  [87 %-98 %] 97 % (04/21 0700) Weight:  [81.2 kg] 81.2 kg (04/21 0400) Last BM Date: 04/13/21  Weight change: Filed Weights   04/13/21 1459 04/14/21 0034 04/15/21 0400  Weight: 75.2 kg 78.2 kg 81.2 kg    Intake/Output:   Intake/Output Summary (Last 24 hours) at 04/15/2021 0924 Last data filed at 04/15/2021 0753 Gross per 24 hour  Intake 997.33 ml  Output 650 ml  Net 347.33 ml      Physical Exam    General: NAD Neck: No JVD, no thyromegaly or thyroid nodule.  Lungs: Clear to auscultation bilaterally with normal respiratory effort. CV: Nondisplaced PMI.  Heart irregular S1/S2, no S3/S4, no murmur.  1+ edema to knees.   Abdomen: Soft, nontender, no hepatosplenomegaly, no distention.  Skin: Intact without lesions or rashes.  Neurologic: Alert and oriented x 3.  Psych: Normal affect. Extremities: Lower legs wrapped.  HEENT: Normal.    Telemetry   Atrial fibrillation in 60s (personally reviewed)   Labs    CBC Recent Labs    04/13/21 0124 04/14/21 0455  04/15/21 0546  WBC 14.6* 16.7* 18.9*  NEUTROABS 12.5* 14.1*  --   HGB 11.4* 10.2* 9.6*  HCT 36.6* 33.0* 31.2*  MCV 95.1 95.7 94.8  PLT 203 170 245   Basic Metabolic Panel Recent Labs    04/14/21 0455 04/15/21 0500  NA 139 136  K 4.3 4.3  CL 104 103  CO2 22 24  GLUCOSE 98 142*  BUN 81* 89*  CREATININE 2.91* 3.17*  CALCIUM 8.5* 8.3*   Liver Function Tests Recent Labs    04/13/21 0124 04/14/21 0455  AST 40 101*  ALT 33 76*  ALKPHOS 92 77  BILITOT 1.1 1.1  PROT 6.8 5.8*  ALBUMIN 3.3* 2.7*   Recent Labs    04/13/21 0124  LIPASE 58*   Cardiac Enzymes No results for input(s): CKTOTAL, CKMB, CKMBINDEX, TROPONINI in the last 72 hours.  BNP: BNP (last 3 results) Recent Labs    06/03/20 0604 02/20/21 0313 04/13/21 1051  BNP 1,981.3* 1,833.3* >4,500.0*    ProBNP (last 3 results) No results for input(s): PROBNP in the last 8760 hours.   D-Dimer No results for input(s): DDIMER in the last 72 hours. Hemoglobin A1C No results for input(s): HGBA1C in the last 72 hours. Fasting Lipid Panel No results for input(s): CHOL, HDL, LDLCALC, TRIG, CHOLHDL, LDLDIRECT in the last 72 hours. Thyroid Function  Tests No results for input(s): TSH, T4TOTAL, T3FREE, THYROIDAB in the last 72 hours.  Invalid input(s): FREET3  Other results:   Imaging    No results found.   Medications:     Scheduled Medications: . (feeding supplement) PROSource Plus  30 mL Oral BID BM  . amiodarone  100 mg Oral Daily  . aspirin  81 mg Oral Daily  . atorvastatin  80 mg Oral QPM  . Chlorhexidine Gluconate Cloth  6 each Topical Daily  . ezetimibe  10 mg Oral Daily  . FLUoxetine  20 mg Oral Daily  . furosemide  80 mg Intravenous BID  . gabapentin  100 mg Oral TID  . levothyroxine  100 mcg Oral QAC breakfast  . multivitamin with minerals  1 tablet Oral Daily  . silver sulfADIAZINE   Topical Daily  . sodium chloride flush  10-40 mL Intracatheter Q12H    Infusions: . cefTRIAXone  (ROCEPHIN)  IV Stopped (04/14/21 1921)  . heparin 1,050 Units/hr (04/15/21 0753)  . milrinone 0.25 mcg/kg/min (04/15/21 0753)  . vancomycin      PRN Medications: acetaminophen, nitroGLYCERIN, ondansetron (ZOFRAN) IV, sodium chloride flush     Assessment/Plan   1. ID: Possible sepsis syndrome from lower extremity cellulitis, has not been compliant with wound clinic.  Plain films of LEs negative for osteomyelitis.  PCT 3.61 but in setting of AKI. WBCs up to 19.  - abx per [facility], on vanc + ceftriaxone   - Blood cultures NGTD.  - Hydralazine/Imdur on hold with soft BP.  2. Acute on Chronic Systolic CHF: Ischemic cardiomyopathy: EF 40-45% on 3/18 echo. Echo 8/20 with EF 45-50%, normal RV. Echo in 9/21 with EF 40-45%, mild mitral stenosis.Recent admit 2/22 for a/c CHF. Repeat echo EF lower, 25-30%, RV ok. Cath declined due to age and lack of ischemic CP. Now readmitted for a/c CHF w/ marked fluid overload and ?low output. BNP >4,500. SCr 2.32 (baseline 1.6). Possible ACS, HS trop 1,562>>1,945>>4008. Echo this admit unchanged EF 25-30% + RWMA (LV global hypokinesis with mild dyskinesis of the left  ventricular apex and disproportionately severe inferior/inferoseptal/inferolaeral hypokinesis), RV moderately reduced. He appeared volume overloaded yesterday and and got IV Lasix without much UOP overnight. PICC placed, co-ox mildly low at 57% and milrinone 0.25 mcg/kg/min begun with co-ox up to 65% yesterday (pending today).  CVP 14-15 on my read this morning, creatinine up to 3.1.  AKI is complicating effort to diurese him.  - No plans for coronary angiography at this point given advanced age and AKI, no chest pain.  - Lasix 80 mg IV bid today, will not push him too hard with creatinine rising.  - Continue milrinone 0.25 mcg/kg/min for now.  - hold Jardiance w/ AKI - No Entresto/ spiro/ dig w/ AKI - Holding hydralazine/Imdur.  - not candidate for advanced therapies given advanced age  66. CAD/  NSTEMI: Has super-dominant RCA. s/p CABG 1992. He had Taxus DES to mid PDA in 5/02. LHC (1/05) with 90% ostial PDA (had DES to this vessel), totally occluded RIMA-PDA, totally occluded PLV, sequential SVG-PLV with 1 branch occluded. LHC (11/15) with left main off the right cusp, 40-50% ostial left main, total occlusion of the proximal LAD (small vessel) with right to left collaterals, large super-dominant RCA with 50% ISR mid RCA, 50% ostial PDA, patent SVG-PLV, RIMA-PDA known atretic.EF dropped on recent echo 2/22 down to 25-30% however no plans for ischemic w/u given lack of CP and advanced age. Pt also declining invasive  procedures. Now readmitted for a/c CHF + abdominal pain. HS trop 1,562>>1,945>>4008.  No chest pain. EKG showed NSR, 83 bpm, w/ nonspecific IVCD with LAD. Echo w/ RWMAs as outlined above. Possible NSTEMI with significant rise in HS-TnI.  - No plans for coronary angiography given advanced age and AKI, no chest pain.   - continue medical therapy w/ IV heparin + statin.  - no ? blocker w/ ?low output and h/o bradycardia  - ASA 81 4. AKI on Stage III CKD: baseline SCr ~1.6. 2.31 on admit, in setting of sepsis and ?low output + fluid overload. Creatinine up to 3.17 today with CVP 14-15. BP has been stable.  - Continue milrinone 0.25 to optimize cardiac output.  - With rise in CVP, I have started him back on IV Lasix.  Follow creatinine closely.  - He would not be a good HD candidate. 5. PAD: Severe bilateral SFA disease on 8/21 peripheral angiogram, no revascularization options. Leg ulcers now healed, no longer going to wound clinic. Minimal claudication.Cilostazol recently discontinued 2/22 given CHF. 6. Paroxysmal Atrial fibrillation/flutter:  Appears to be in AF this admission. Rate controlled. Off ? blocker due to bradycardia - Eliquis on hold. Continue IV heparin for NSTEMI.   - Continue home amiodarone 100 mg daily for now.  - Repeat ECG to confirm rhythm.  7. COPD: followed  by pulmonology   8. 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 He has had resulting imbalance.  - He is no longer driving.  9. Hypothyroidism: continue levothyroxine  10. Pulmonary nodule: 1.3 cm RML nodule on 1/19 CT chest. It was also seen in 2014 =>slow growing. Dr Lake Bells has discussed with patient and wife and they have decided not to go forward with biopsy. They would not be interested in lung cancer treatment. CT chest in 7/20 showed stable 1 cm RML nodule.  11. Delirium: Suspect due to medical illness.  Re-orient, treat HF and suspected infection. Improved today.   Mobilize with PT  Length of Stay: 2  Loralie Champagne, MD  04/15/2021, 9:24 AM  Advanced Heart Failure Team Pager [phone] (M-F; 7a - 5p)  Please contact [facility] for night-coverage after hours (5p -7a ) and weekends on [email or web address]

## 2021-04-15 NOTE — Consult Note (Signed)
   [facility] Inpatient Consult   04/15/2021  MAKAIO MACH 1928-12-05 [id number]  [facility] Organization [ACO] Patient: [facility] Medicare    Patient screened for hospitalization with noted extreme high risk score for unplanned readmission risk and  to assess for potential [facility] service needs for post hospital transition.  Review of patient's medical record reveals patient is active with paramedicine and the heart and vascular team noted.  Plan:  Continue to follow progress with inpatient and disposition to assess for post hospital care management needs.    For questions contact:   Natividad Brood, RN BSN [facility] Liaison  [phone] business mobile phone Toll free office [phone]  Fax number: [phone] [email or web address]

## 2021-04-15 NOTE — Progress Notes (Signed)
Foley cath inserted per order clear yellow urine return patient tolerate well. Will continue to monitor the patient.

## 2021-04-15 NOTE — Progress Notes (Signed)
PROGRESS NOTE    Andrew [name]  GNF:[id number] DOB: Aug 30, 1928 DOA: 04/13/2021 PCP: [name], MD   Brief Narrative:  Andrew [name] is a 87 y.o. male with medical history significant of combind HF, a fib on eliquis, hypothyroidism, HLD, PAD. Presenting with abdominal pain. History per wife as patient is a poor historian. Patient was originally seen this morning for abdominal pain. His wife reports that he's had RUQ ab pain for several months. It comes in waves. He's not seen a physician about it. He just dealt with it. It has apparently become worse over the last couple of days. The wife also noted that the patient has had a worsening of his leg wounds since he stopped going to the wound care center. He hasn't been to the center in at least 3 weeks. She says during that time, his legs have become more red and painful. He is not able to walk on his own at this point. She states that he stopped going just because he was tired of the hassle. She denies any other aggravating or alleviating factors. In ED: CT of abdomen and pelvis was revealing for cholelithiasis w/o acute inflammation. XR BLE was not concerning for osteo. CXR was concerning for CHF. He was found to have elevated tropinins and BNP. He was given vanc. Cardiology was consulted. TR[facility] was called for admission.   Assessment & Plan:   Active Problems:   Acute on chronic systolic CHF (congestive heart failure) (HCC)   Acute on chronic systolic congestive heart failure (HCC)   Cellulitis of left lower extremity  Acute on chronic combined HF Elevated troponin in setting of sepsis - Cardiology consulted appreciate insight and recommendations - BNP markedly elevated in the setting of AKI - Last echo 01/2021 showed EF 25% w/ global hypokinesis - Continue heparin gtt - holding home Eliquis  AKI on CKD3b -Creatinine continues to worsen in the setting of cardiorenal disease, ongoing diuresis and likely poor renal perfusion -Renal  ultrasound pending -Bladder scan continues to show minimal urine, low urine output today despite aggressive diuretics, patient remains hypervolemic -Renal ultrasound to rule out any hydronephrosis or obstruction, CT abdomen on the 19th was without any overt obstructive findings  Chronic hypoxic respiratory failure on 3L [place] - normally on 2 - 3 L [place] at night only - currently maintain sats on home dosing; follow -Cardiac wheeze noted today on exam  Sepsis secondary to BLE cellulitis, POA resolving - Discontinue vancomycin in the setting of above AKI - Continue ceftriaxone - Follow cultures -rule out dissemination of infection preliminary negative - Wound care following - Sepsis criteria resolved - Procalcitonin markedly positive  Hypothyroidism - Continue synthroid  PAF on eliquis - Blood pressure downtrending overnight, hold carvedilol - Transition from Eliquis to heparin as above  Hx of HTN - Pressures are soft, hold home regimen  Hx of PAD, HLD - Continue zetia, lipitior  Previously reported abdominal pain, unspecified - Per previous facility patient had presented with abdominal pain, denies any abdominal pain or issues at this point - CT shows cholelithiasis without any inflammation, abdomen benign on exam  DVT prophylaxis: Heparin Code Status: Full Family Communication: None present  Status is: Inpatient  Dispo: The patient is from: Home              Anticipated d/c is to: To be determined              Anticipated d/c date is: >72 hours  Patient currently not medically stable for discharge  Consultants:   Cardiology  Procedures:   None  Antimicrobials:  Vancomycin, ceftriaxone initiated 04/13/2021  Subjective: No acute issues or events overnight, more awake and alert this morning, review of systems still markedly limited given patient's mental status  Objective: Vitals:   04/15/21 0500 04/15/21 0600 04/15/21 0700 04/15/21 0743  BP:  (!)  104/51    Pulse:      Resp: 20 18 19    Temp:    98.6 F (37 C)  TempSrc:    Oral  SpO2: 91% (!) 87% 97%   Weight:      Height:        Intake/Output Summary (Last 24 hours) at 04/15/2021 0744 Last data filed at 04/14/2021 2138 Gross per 24 hour  Intake 840 ml  Output 400 ml  Net 440 ml   Filed Weights   04/13/21 1459 04/14/21 0034 04/15/21 0400  Weight: 75.2 kg 78.2 kg 81.2 kg    Examination:  General exam: Appears calm and comfortable; awake being fed breakfast at bedside by staff Respiratory system: Clear to auscultation. Respiratory effort normal. Cardiovascular system: S1 & S2 heard, RRR. No JVD, murmurs, rubs, gallops or clicks. No pedal edema. Gastrointestinal system: Abdomen is nondistended, soft and nontender. No organomegaly or masses felt. Normal bowel sounds heard. Central nervous system: Without focal neurodeficit. Skin: No rashes   Data Reviewed: I have personally reviewed following labs and imaging studies  CBC: Recent Labs  Lab 04/13/21 0124 04/14/21 0455 04/15/21 0546  WBC 14.6* 16.7* 18.9*  NEUTROABS 12.5* 14.1*  --   HGB 11.4* 10.2* 9.6*  HCT 36.6* 33.0* 31.2*  MCV 95.1 95.7 94.8  PLT 203 170 580   Basic Metabolic Panel: Recent Labs  Lab 04/13/21 0124 04/13/21 2332 04/14/21 0455 04/15/21 0500  NA 141 137 139 136  K 4.5 4.3 4.3 4.3  CL 106 104 104 103  CO2 23 22 22 24   GLUCOSE 144* 94 98 142*  BUN 70* 74* 81* 89*  CREATININE 2.31* 2.82* 2.91* 3.17*  CALCIUM 8.9 8.4* 8.5* 8.3*   GFR: Estimated Creatinine Clearance: 15.8 mL/min (A) (by C-G formula based on SCr of 3.17 mg/dL (H)). Liver Function Tests: Recent Labs  Lab 04/13/21 0124 04/14/21 0455  AST 40 101*  ALT 33 76*  ALKPHOS 92 77  BILITOT 1.1 1.1  PROT 6.8 5.8*  ALBUMIN 3.3* 2.7*   Recent Labs  Lab 04/13/21 0124  LIPASE 58*   No results for input(s): AMMONIA in the last 168 hours. Coagulation Profile: No results for input(s): INR, PROTIME in the last 168  hours. Cardiac Enzymes: No results for input(s): CKTOTAL, CKMB, CKMBINDEX, TROPONINI in the last 168 hours. BNP (last 3 results) No results for input(s): PROBNP in the last 8760 hours. HbA1C: No results for input(s): HGBA1C in the last 72 hours. CBG: Recent Labs  Lab 04/13/21 1530 04/14/21 2113 04/15/21 0608  GLUCAP 92 140* 140*   Lipid Profile: No results for input(s): CHOL, HDL, LDLCALC, TRIG, CHOLHDL, LDLDIRECT in the last 72 hours. Thyroid Function Tests: No results for input(s): TSH, T4TOTAL, FREET4, T3FREE, THYROIDAB in the last 72 hours. Anemia Panel: No results for input(s): VITAMINB12, FOLATE, FERRITIN, TIBC, IRON, RETICCTPCT in the last 72 hours. Sepsis Labs: Recent Labs  Lab 04/14/21 1355  PROCALCITON 3.61    Recent Results (from the past 240 hour(s))  Blood culture (routine x 2)     Status: None (Preliminary result)   Collection Time: 04/13/21  10:41 AM   Specimen: Left Antecubital; Blood  Result Value Ref Range Status   Specimen Description   Final    LEFT ANTECUBITAL Performed at [facility], [place] [address], [place], [place] [address]    Special Requests   Final    BOTTLES DRAWN AEROBIC AND ANAEROBIC Blood Culture adequate volume Performed at [facility] [address], [place], [place] [address]    Culture   Final    NO GROWTH < 24 HOURS Performed at [facility] [address] [place], [place] [address]    Report Status PENDING  Incomplete  Blood culture (routine x 2)     Status: None (Preliminary result)   Collection Time: 04/13/21 10:51 AM   Specimen: Right Antecubital; Blood  Result Value Ref Range Status   Specimen Description   Final    RIGHT ANTECUBITAL Performed at [facility] [address] [place], [place] [address]    Special Requests   Final    BOTTLES DRAWN AEROBIC AND ANAEROBIC Blood Culture adequate volume Performed at [facility]  [address] [place], [place] [address]    Culture   Final    NO GROWTH < 24 HOURS Performed at [facility] [address], [place], [place] [address]    Report Status PENDING  Incomplete  Resp Panel by RT-PCR (Flu A&B, Covid) Nasopharyngeal Swab     Status: None   Collection Time: 04/13/21 10:59 AM   Specimen: Nasopharyngeal Swab; Nasopharyngeal(NP) swabs in vial transport medium  Result Value Ref Range Status   SARS Coronavirus 2 by RT PCR NEGATIVE NEGATIVE Final    Comment: (NOTE) SARS-CoV-2 target nucleic acids are NOT DETECTED.  The SARS-CoV-2 RNA is generally detectable in upper respiratory specimens during the acute phase of infection. The lowest concentration of SARS-CoV-2 viral copies this assay can detect is 138 copies/mL. A negative result does not preclude SARS-Cov-2 infection and should not be used as the sole basis for treatment or other patient management decisions. A negative result may occur with  improper specimen collection/handling, submission of specimen other than nasopharyngeal swab, presence of viral mutation(s) within the areas targeted by this assay, and inadequate number of viral copies(<138 copies/mL). A negative result must be combined with clinical observations, patient history, and epidemiological information. The expected result is Negative.  Fact Sheet for Patients:  EntrepreneurPulse.com.au  Fact Sheet for Healthcare Providers:  IncredibleEmployment.be  This test is no t yet approved or cleared by the [place] FDA and  has been authorized for detection and/or diagnosis of SARS-CoV-2 by FDA under an Emergency Use Authorization (EUA). This EUA will remain  in effect (meaning this test can be used) for the duration of the COVID-19 declaration under Section 564(b)(1) of the Act, 21 U.S.C.section 360bbb-3(b)(1), unless the authorization is terminated  or revoked sooner.       Influenza A by PCR  NEGATIVE NEGATIVE Final   Influenza B by PCR NEGATIVE NEGATIVE Final    Comment: (NOTE) The Xpert Xpress SARS-CoV-2/FLU/RSV plus assay is intended as an aid in the diagnosis of influenza from Nasopharyngeal swab specimens and should not be used as a sole basis for treatment. Nasal washings and aspirates are unacceptable for Xpert Xpress SARS-CoV-2/FLU/RSV testing.  Fact Sheet for Patients: EntrepreneurPulse.com.au  Fact Sheet for Healthcare Providers: IncredibleEmployment.be  This test is not yet approved or cleared by the [place] FDA and has been authorized for detection and/or diagnosis  of SARS-CoV-2 by FDA under an Emergency Use Authorization (EUA). This EUA will remain in effect (meaning this test can be used) for the duration of the COVID-19 declaration under Section 564(b)(1) of the Act, 21 U.S.C. section 360bbb-3(b)(1), unless the authorization is terminated or revoked.  Performed at [facility], [place] [address], [place], [place] [address]       Radiology Studies: DG Tibia/Fibula Left  Result Date: 04/13/2021 CLINICAL DATA:  Leg ulcer EXAM: LEFT TIBIA AND FIBULA - 2 VIEW COMPARISON:  None. FINDINGS: There is no evidence of fracture or other focal bone lesions. No focal erosion or periosteal elevation. Extensive vascular calcifications. Mild diffuse soft tissue swelling. IMPRESSION: 1. No acute osseous abnormality in the left tibia or fibula. 2. Mild diffuse soft tissue swelling. 3. Extensive vascular calcifications. Electronically Signed   By: Davina Poke D.O.   On: 04/13/2021 11:04   DG Tibia/Fibula Right  Result Date: 04/13/2021 CLINICAL DATA:  Leg ulcers.  Evaluate for osteomyelitis. EXAM: RIGHT TIBIA AND FIBULA - 2 VIEW COMPARISON:  Right knee radiographs 07/03/2020. Right tibia/fibula radiographs 09/15/2015. FINDINGS: No acute fracture is identified. The knee and ankle appear located. No interval osseous  erosion is seen to indicate osteomyelitis. There is mild medial compartment joint space narrowing at the knee. Patellar and calcaneal enthesophytes are noted. There are surgical clips in the lower leg with atherosclerotic vascular calcifications and soft tissue swelling. IMPRESSION: No evidence of osteomyelitis. Electronically Signed   By: Logan Bores M.D.   On: 04/13/2021 11:03   DG CHEST PORT 1 VIEW  Result Date: 04/13/2021 CLINICAL DATA:  PICC line placement EXAM: PORTABLE CHEST 1 VIEW COMPARISON:  04/13/2021, 02/20/2021 CT 07/03/2019 FINDINGS: Post sternotomy changes. Interim placement of right upper extremity central venous catheter, difficult visualization of the tip due to overlying support leads. Tip appears to be over the cavoatrial region. Cardiomegaly with vascular congestion and small pleural effusions, not much interval change. Aortic atherosclerosis. No pneumothorax. IMPRESSION: Slightly difficult visualization of right upper extremity central venous catheter tip, it appears to project over the cavoatrial region. Continued cardiomegaly, vascular congestion, and small pleural effusions. Electronically Signed   By: Donavan Foil M.D.   On: 04/13/2021 21:28   DG Chest Port 1 View  Result Date: 04/13/2021 CLINICAL DATA:  Hypoxia. EXAM: PORTABLE CHEST 1 VIEW COMPARISON:  02/20/2021. FINDINGS: Prior CABG. Cardiomegaly. Mild bilateral interstitial prominence and small bilateral pleural effusions. Findings suggest CHF. Mild bibasilar atelectasis. No pneumothorax. IMPRESSION: 1. Prior CABG. Cardiomegaly with mild bilateral interstitial prominence and small bilateral pleural effusions. Findings suggest CHF. 2.  Low lung volumes with bibasilar atelectasis. Electronically Signed   By: Marcello Moores  Register   On: 04/13/2021 11:05   ECHOCARDIOGRAM COMPLETE  Result Date: 04/13/2021    ECHOCARDIOGRAM REPORT   Patient Name:   Andrew [name] Date of Exam: 04/13/2021 Medical Rec #:  [id number]        Height:        67.0 in Accession #:    [id number]       Weight:       170.0 lb Date of Birth:  26-Oct-1928        BSA:          1.887 m Patient Age:    56 years         BP:           101/76 mmHg Patient Gender: M  HR:           67 bpm. Exam Location:  Inpatient Procedure: 2D Echo, Cardiac Doppler, Color Doppler and Intracardiac            Opacification Agent Indications:    CHF-Acute Systolic O53.66  History:        Patient has prior history of Echocardiogram examinations, most                 recent 02/21/2021. CHF, CAD, COPD, Arrythmias:Atrial Fibrillation                 and Atrial Flutter, Signs/Symptoms:Shortness of Breath and                 Dyspnea; Risk Factors:Hypertension, Dyslipidemia and Diabetes.  Sonographer:    Bernadene Person RDCS Referring Phys: 1993 RHONDA G BARRETT  Sonographer Comments: Image acquisition challenging due to respiratory motion. IMPRESSIONS  1. Left ventricular ejection fraction, by estimation, is 25 to 30%. The left ventricle has severely decreased function. The left ventricle demonstrates global hypokinesis with mild dyskinesis of the left ventricular apex and disproportionately severe inferior/inferoseptal/inferolaeral hypokinesis. There is relatively preserved anterior septal and basal lateral wall contractility. Left ventricular diastolic parameters are consistent with Grade II diastolic dysfunction (pseudonormalization). Elevated left atrial pressure.  2. Right ventricular systolic function is moderately reduced. The right ventricular size is normal. There is normal pulmonary artery systolic pressure.  3. Left atrial size was severely dilated.  4. The mitral valve is degenerative. Mild mitral valve regurgitation. No evidence of mitral stenosis. Severe mitral annular calcification.  5. The aortic valve is tricuspid. Aortic valve regurgitation is not visualized. Mild aortic valve sclerosis is present, with no evidence of aortic valve stenosis.  6. Aortic dilatation noted. There is  borderline dilatation of the aortic root, measuring 38 mm.  7. The inferior vena cava is dilated in size with <50% respiratory variability, suggesting right atrial pressure of 15 mmHg. Comparison(s): No significant change from prior study. Prior images reviewed side by side. The rhythm appears to be sinus with very long PR interval and frequent PACs, versus periods of Mobitz type 1 second degree AV block. FINDINGS  Left Ventricle: There is no left ventricular thrombus with Definity. Left ventricular ejection fraction, by estimation, is 25 to 30%. The left ventricle has severely decreased function. The left ventricle demonstrates regional wall motion abnormalities.  Mild dyskinesis of the left ventricular, entire apical segment. Severe hypokinesis of the left ventricular, entire inferior wall, inferolateral wall and inferoseptal wall. Definity contrast agent was given IV to delineate the left ventricular endocardial borders. The left ventricular internal cavity size was normal in size. There is no left ventricular hypertrophy. Abnormal (paradoxical) septal motion consistent with post-operative status. Left ventricular diastolic function could not be evaluated due to atrial fibrillation. Left ventricular diastolic parameters are consistent with Grade II diastolic dysfunction (pseudonormalization). Elevated left atrial pressure.  LV Wall Scoring: The entire apex is dyskinetic. The inferior wall, posterior wall, mid inferoseptal segment, and basal inferoseptal segment are hypokinetic. The anterior wall, antero-lateral wall, and anterior septum are normal. Right Ventricle: The right ventricular size is normal. No increase in right ventricular wall thickness. Right ventricular systolic function is moderately reduced. There is normal pulmonary artery systolic pressure. The tricuspid regurgitant velocity is 2.28 m/s, and with an assumed right atrial pressure of 15 mmHg, the estimated right ventricular systolic pressure is  44.0 mmHg. Left Atrium: Left atrial size was severely dilated. Right Atrium: Right atrial size  was normal in size. Pericardium: There is no evidence of pericardial effusion. Mitral Valve: The mitral valve is degenerative in appearance. Severe mitral annular calcification. Mild mitral valve regurgitation. No evidence of mitral valve stenosis. Tricuspid Valve: The tricuspid valve is normal in structure. Tricuspid valve regurgitation is trivial. Aortic Valve: The aortic valve is tricuspid. Aortic valve regurgitation is not visualized. Mild aortic valve sclerosis is present, with no evidence of aortic valve stenosis. Pulmonic Valve: The pulmonic valve was grossly normal. Pulmonic valve regurgitation is trivial. Aorta: Aortic dilatation noted. There is borderline dilatation of the aortic root, measuring 38 mm. Venous: The inferior vena cava is dilated in size with less than 50% respiratory variability, suggesting right atrial pressure of 15 mmHg. IAS/Shunts: No atrial level shunt detected by color flow Doppler.  LEFT VENTRICLE PLAX 2D LVIDd:         4.30 cm LVIDs:         3.10 cm LV PW:         1.10 cm LV IVS:        1.00 cm LVOT diam:     2.00 cm LV SV:         35 LV SV Index:   18 LVOT Area:     3.14 cm  LV Volumes (MOD) LV vol d, MOD A2C: 143.0 ml LV vol d, MOD A4C: 151.0 ml LV vol s, MOD A2C: 117.0 ml LV vol s, MOD A4C: 97.1 ml LV SV MOD A2C:     26.0 ml LV SV MOD A4C:     151.0 ml LV SV MOD BP:      40.9 ml RIGHT VENTRICLE TAPSE (M-mode): 1.1 cm LEFT ATRIUM             Index       RIGHT ATRIUM           Index LA diam:        5.20 cm 2.76 cm/m  RA Area:     14.40 cm LA Vol (A2C):   75.8 ml 40.16 ml/m RA Volume:   32.00 ml  16.96 ml/m LA Vol (A4C):   76.4 ml 40.48 ml/m LA Biplane Vol: 80.8 ml 42.81 ml/m  AORTIC VALVE LVOT Vmax:   58.85 cm/s LVOT Vmean:  41.900 cm/s LVOT VTI:    0.111 m  AORTA Ao Root diam: 3.80 cm Ao Asc diam:  3.20 cm MITRAL VALVE            TRICUSPID VALVE MV Area (PHT): 5.04 cm TR Peak  grad:   20.8 mmHg MV Decel Time: 151 msec TR Vmax:        228.00 cm/s                          SHUNTS                         Systemic VTI:  0.11 m                         Systemic Diam: 2.00 cm Dani Gobble Croitoru MD Electronically signed by Sanda Klein MD Signature Date/Time: 04/13/2021/3:00:16 PM    Final    [place] EKG SITE RITE  Result Date: 04/13/2021 If Site Rite image not attached, placement could not be confirmed due to current cardiac rhythm.  [place] EKG SITE RITE  Result Date: 04/13/2021 If Site Rite image not attached, placement could not  be confirmed due to current cardiac rhythm.   Scheduled Meds: . (feeding supplement) PROSource Plus  30 mL Oral BID BM  . amiodarone  100 mg Oral Daily  . aspirin  81 mg Oral Daily  . atorvastatin  80 mg Oral QPM  . Chlorhexidine Gluconate Cloth  6 each Topical Daily  . ezetimibe  10 mg Oral Daily  . FLUoxetine  20 mg Oral Daily  . furosemide  80 mg Intravenous BID  . gabapentin  100 mg Oral TID  . levothyroxine  100 mcg Oral QAC breakfast  . multivitamin with minerals  1 tablet Oral Daily  . sodium chloride flush  10-40 mL Intracatheter Q12H   Continuous Infusions: . cefTRIAXone (ROCEPHIN)  IV 1 g (04/14/21 1851)  . heparin 1,050 Units/hr (04/14/21 1852)  . milrinone 0.25 mcg/kg/min (04/14/21 0055)  . vancomycin       LOS: 2 days   Time spent: 48min  Kasiah Manka C Maycie Luera, DO [facility]  If 7PM-7AM, please contact night-coverage [email or web address]  04/15/2021, 7:44 AM

## 2021-04-15 NOTE — Progress Notes (Signed)
ANTICOAGULATION + VANCOMYCIN CONSULT NOTE - Follow Up Consult  Pharmacy Consult for Heparin Indication: NSTEMI  Allergies  Allergen Reactions  . Metoprolol Shortness Of Breath, Palpitations and Other (See Comments)    Heart starts racing. Shallow breathing; pt states he also get skin irritation on his legs  . Metformin Hives and Swelling    On legs  . Metformin And Related Nausea And Vomiting    Patient Measurements: Height: 5\' 7"  (170.2 cm) Weight: 81.2 kg (179 lb 0.2 oz) IBW/kg (Calculated) : 66.1 Heparin Dosing Weight: 78.2 kg  Vital Signs: Temp: 98.5 F (36.9 C) (04/21 1129) Temp Source: Oral (04/21 1129) BP: 104/51 (04/21 0600) Pulse Rate: 76 (04/21 0400)  Labs: Recent Labs    04/13/21 0124 04/13/21 1051 04/13/21 1228 04/13/21 1516 04/13/21 1654 04/13/21 1926 04/13/21 2332 04/14/21 0154 04/14/21 0155 04/14/21 0455 04/14/21 1249 04/15/21 0500 04/15/21 0546  HGB 11.4*  --   --   --   --   --   --   --   --  10.2*  --   --  9.6*  HCT 36.6*  --   --   --   --   --   --   --   --  33.0*  --   --  31.2*  PLT 203  --   --   --   --   --   --   --   --  170  --   --  173  APTT  --   --   --    < >  --   --   --  79*  --   --  62* 43*  --   HEPARINUNFRC  --   --   --    < >  --   --   --   --  0.91*  --  0.82* 0.63  --   CREATININE 2.31*  --   --   --   --   --  2.82*  --   --  2.91*  --  3.17*  --   TROPONINIHS  --    < > 1,945*  --  3,107* 4,008*  --   --   --   --   --   --   --    < > = values in this interval not displayed.    Estimated Creatinine Clearance: 15.8 mL/min (A) (by C-G formula based on SCr of 3.17 mg/dL (H)).  Assessment:  Patient is a 87 y.o M with hx CAD (s/p CABG), ischemic cardiomyopathy, CVA and afib on Eliquis PTA, presented to the ED on 4/19 with c/o abdominal pain and decreased appetite.  He was found to have diffuse erythema of the LE and elevated troponin.  Pharmacy  consulted for heparin drip for NSTEMI and Vancomycin for  cellulitis.  Heparin level now down to 0.6 on Heparin 1050 units/hr. No bleeding issues noted. CBC stable.   Goal of Therapy:  Heparin level 0.3-0.7 units/ml aPTT 66-102 seconds Monitor platelets by anticoagulation protocol: Yes    Plan:  Continue heparin drip at 1050 units/hr. Heparin level and CBC in am.  Erin Hearing PharmD., BCPS Clinical Pharmacist 04/15/2021 2:36 PM

## 2021-04-15 NOTE — Evaluation (Signed)
Occupational Therapy Evaluation Patient Details Name: Andrew [name] MRN: [id number] DOB: [birth year] Today's Date: 04/15/2021    History of Present Illness 87 y.o. male with medical history significant of combind HF, a fib on eliquis, hypothyroidism, HLD, PAD. Presenting with abdominal pain.  Subsequent finding of lower extremity cellulitis, has not been compliant with wound clinic.   Clinical Impression   Patient admitted for the above diagnosis.  He presented as very lethargic, did open his eyes to command and answer basic questions, but the bulk of prior level was taken from the chart.  PTA he lives with his spouse and appears to be needing increasing assist with ADL and mobility.  Barriers are listed below.  Limited OT eval this date, the patient needed up to Max A for basic bed mobility, and was unable to maintain edge of bed sitting without assist.  Patient food tray arrived, but he did not wish to eat.  SNF is recommended, but OT to further assess mobility and self care abilities.      Follow Up Recommendations  SNF    Equipment Recommendations  None recommended by OT    Recommendations for Other Services       Precautions / Restrictions Precautions Precautions: Fall Precaution Comments: Wraps to B LE's Restrictions Weight Bearing Restrictions: No      Mobility Bed Mobility Overal bed mobility: Needs Assistance Bed Mobility: Supine to Sit;Sit to Supine     Supine to sit: Max assist;HOB elevated Sit to supine: Max assist;HOB elevated        Transfers                 General transfer comment: deferred due to decreased LOA - patient closing his eyes throughout.    Balance Overall balance assessment: Needs assistance Sitting-balance support: Bilateral upper extremity supported Sitting balance-Leahy Scale: Poor   Postural control: Posterior lean;Left lateral lean                                 ADL either performed or assessed with  clinical judgement   ADL                                         General ADL Comments: Will need to be assessed     Vision Baseline Vision/History: Wears glasses Wears Glasses: At all times Patient Visual Report: No change from baseline       Perception     Praxis      Pertinent Vitals/Pain Pain Assessment: Faces Faces Pain Scale: Hurts even more Pain Location: lower legs Pain Descriptors / Indicators: Grimacing;Guarding;Tender Pain Intervention(s): Monitored during session     Hand Dominance Right   Extremity/Trunk Assessment Upper Extremity Assessment Upper Extremity Assessment: Generalized weakness   Lower Extremity Assessment Lower Extremity Assessment: Defer to PT evaluation   Cervical / Trunk Assessment Cervical / Trunk Assessment: Kyphotic   Communication Communication Communication: HOH   Cognition Arousal/Alertness: Lethargic Behavior During Therapy: Flat affect Overall Cognitive Status: Difficult to assess                                     General Comments       Exercises     Shoulder Instructions  Home Living Family/patient expects to be discharged to:: Private residence Living Arrangements: Spouse/significant other Available Help at Discharge: Family;Available 24 hours/day Type of Home: House Home Access: Stairs to enter [employer] of Steps: 8 steps from garage which is most used entrance. Entrance Stairs-Rails: Can reach both Home Layout: Multi-level Alternate Level Stairs-Number of Steps: 4 steps from living/kitchen area to bedrooms and bathrooms. Alternate Level Stairs-Rails: Right;Left;Can reach both Bathroom Shower/Tub: Occupational psychologist: Standard Bathroom Accessibility: Yes How Accessible: Accessible via walker Home Equipment: [place] - 2 wheels;Cane - single point;Wheelchair - manual          Prior Functioning/Environment Level of Independence: Needs  assistance  Gait / Transfers Assistance Needed: patient using RW in the home and w/c for community mobility. ADL's / Homemaking Assistance Needed: Patient stating he needs some help with lower body self care.  ? use of shower given leg wounds.   Comments: No family in the room to validate information.        OT Problem List: Decreased strength;Decreased activity tolerance;Impaired balance (sitting and/or standing);Decreased cognition;Pain;Increased edema      OT Treatment/Interventions: Self-care/ADL training;Therapeutic exercise;DME and/or AE instruction;Therapeutic activities;Cognitive remediation/compensation;Patient/family education;Balance training    OT Goals(Current goals can be found in the care plan section) Acute Rehab OT Goals Patient Stated Goal: none stated OT Goal Formulation: Patient unable to participate in goal setting Time For Goal Achievement: 04/29/21 Potential to Achieve Goals: Fair ADL Goals Pt Will Perform Grooming: with supervision;sitting Pt Will Perform Upper Body Bathing: with supervision;sitting Pt Will Perform Upper Body Dressing: with supervision;sitting Pt Will Transfer to Toilet: with min assist;bedside commode;stand pivot transfer  OT Frequency: Min 2X/week   Barriers to D/C:    none noted       Co-evaluation              AM-PAC OT "6 Clicks" Daily Activity     Outcome Measure Help from another person eating meals?: A Little Help from another person taking care of personal grooming?: A Little Help from another person toileting, which includes using toliet, bedpan, or urinal?: A Lot Help from another person bathing (including washing, rinsing, drying)?: A Lot Help from another person to put on and taking off regular upper body clothing?: A Lot Help from another person to put on and taking off regular lower body clothing?: Total 6 Click Score: 13   End of Session Equipment Utilized During Treatment: Oxygen  Activity Tolerance: Patient  limited by lethargy Patient left: in bed;with call bell/phone within reach;with bed alarm set  OT Visit Diagnosis: Muscle weakness (generalized) (M62.81);Other symptoms and signs involving cognitive function;Pain Pain - Right/Left: Left Pain - part of body: Leg                Time: 1700-1716 OT Time Calculation (min): 16 min Charges:  OT General Charges $OT Visit: 1 Visit OT Evaluation $OT Eval Moderate Complexity: 1 Mod  04/15/2021  Rich, OTR/L  Acute Rehabilitation Services  Office:  [place] 04/15/2021, 5:34 PM

## 2021-04-16 ENCOUNTER — Inpatient Hospital Stay (HOSPITAL_COMMUNITY): Payer: Medicare PPO

## 2021-04-16 DIAGNOSIS — N184 Chronic kidney disease, stage 4 (severe): Secondary | ICD-10-CM | POA: Diagnosis not present

## 2021-04-16 DIAGNOSIS — I5023 Acute on chronic systolic (congestive) heart failure: Secondary | ICD-10-CM | POA: Diagnosis not present

## 2021-04-16 DIAGNOSIS — I214 Non-ST elevation (NSTEMI) myocardial infarction: Secondary | ICD-10-CM | POA: Diagnosis not present

## 2021-04-16 DIAGNOSIS — Z515 Encounter for palliative care: Secondary | ICD-10-CM | POA: Diagnosis not present

## 2021-04-16 DIAGNOSIS — Z7189 Other specified counseling: Secondary | ICD-10-CM

## 2021-04-16 LAB — GLUCOSE, CAPILLARY
Glucose-Capillary: 158 mg/dL — ABNORMAL HIGH (ref 70–99)
Glucose-Capillary: 183 mg/dL — ABNORMAL HIGH (ref 70–99)
Glucose-Capillary: 171 mg/dL — ABNORMAL HIGH (ref 70–99)
Glucose-Capillary: 176 mg/dL — ABNORMAL HIGH (ref 70–99)

## 2021-04-16 LAB — CBC
HCT: 29 % — ABNORMAL LOW (ref 39.0–52.0)
Hemoglobin: 9.1 g/dL — ABNORMAL LOW (ref 13.0–17.0)
MCH: 29.4 pg (ref 26.0–34.0)
MCHC: 31.4 g/dL (ref 30.0–36.0)
MCV: 93.5 fL (ref 80.0–100.0)
Platelets: 156 10*3/uL (ref 150–400)
RBC: 3.1 MIL/uL — ABNORMAL LOW (ref 4.22–5.81)
RDW: 15.2 % (ref 11.5–15.5)
WBC: 19.5 10*3/uL — ABNORMAL HIGH (ref 4.0–10.5)
nRBC: 0.2 % (ref 0.0–0.2)

## 2021-04-16 LAB — BASIC METABOLIC PANEL
Anion gap: 10 (ref 5–15)
BUN: 98 mg/dL — ABNORMAL HIGH (ref 8–23)
CO2: 23 mmol/L (ref 22–32)
Calcium: 8 mg/dL — ABNORMAL LOW (ref 8.9–10.3)
Chloride: 99 mmol/L (ref 98–111)
Creatinine, Ser: 3.39 mg/dL — ABNORMAL HIGH (ref 0.61–1.24)
GFR, Estimated: 17 mL/min — ABNORMAL LOW (ref >60)
Glucose, Bld: 163 mg/dL — ABNORMAL HIGH (ref 70–99)
Potassium: 4.1 mmol/L (ref 3.5–5.1)
Sodium: 132 mmol/L — ABNORMAL LOW (ref 135–145)

## 2021-04-16 LAB — COOXEMETRY PANEL
Carboxyhemoglobin: 1.4 % (ref 0.5–1.5)
Methemoglobin: 0.8 % (ref 0.0–1.5)
O2 Saturation: 64.7 %
Total hemoglobin: 9.4 g/dL — ABNORMAL LOW (ref 12.0–16.0)

## 2021-04-16 LAB — HEPARIN LEVEL (UNFRACTIONATED): Heparin Unfractionated: 0.56 IU/mL (ref 0.30–0.70)

## 2021-04-16 MED ORDER — APIXABAN 2.5 MG PO TABS
2.5000 mg | ORAL_TABLET | Freq: Two times a day (BID) | ORAL | Status: DC
  Administered 2021-04-16 – 2021-04-17 (×3): 2.5 mg via ORAL
  Filled 2021-04-16 (×3): qty 1

## 2021-04-16 MED ORDER — METOLAZONE 5 MG PO TABS
5.0000 mg | ORAL_TABLET | Freq: Once | ORAL | Status: AC
  Administered 2021-04-16: 5 mg via ORAL
  Filled 2021-04-16: qty 1

## 2021-04-16 MED ORDER — FUROSEMIDE 10 MG/ML IJ SOLN
10.0000 mg/h | INTRAVENOUS | Status: DC
  Administered 2021-04-16 (×2): 10 mg/h via INTRAVENOUS
  Filled 2021-04-16 (×2): qty 20

## 2021-04-16 NOTE — Evaluation (Signed)
Physical Therapy Evaluation Patient Details Name: Andrew [name] MRN: [id number] DOB: 1928/02/22 Today's Date: 04/16/2021   History of Present Illness  Pt is a 87 y.o. male admitted 04/13/21 with RUQ abdominal pain for several months that has gotten acutely worse, BLE pain and redness. CXR concerning for CHF. CT shows cholelithiasis without any inflammation. Workup for CHF, sepsis secondary to BLE cellulitis, AKI on CKD. PMH includes combined HF, afib on eliquis, PAD, chronic resp failure (wears 2-3L O2 at night). Of note, recent admission 02/20/2021 with HF symptoms.    Clinical Impression  Pt presents with an overall decrease in functional mobility secondary to above. PTA, pt reports ambulatory with RW, use of w/c for community mobility; lives with wife who assists with ADLs as needed. Today, pt requires maxA for bed mobility and standing attempts with RW; unable to achieve fully upright standing or initiate steps. Pt presents with diffuse weakness, BLE pain, decreased activity tolerance and cognitive impairment. Pt would benefit from continued acute PT services to maximize functional mobility and independence prior to d/c with SNF-level therapies.  SpO2 95% on 2L O2 HR 68 Post-mobility BP 110/62     Follow Up Recommendations SNF;Supervision for mobility/OOB    Equipment Recommendations   (TBD - if home, will likely need hospital bed, hoyer lift, w/c)    Recommendations for Other Services       Precautions / Restrictions Precautions Precautions: Fall;Other (comment) Precaution Comments: Weeping BLEs, wrapped in gauze Restrictions Weight Bearing Restrictions: No      Mobility  Bed Mobility Overal bed mobility: Needs Assistance Bed Mobility: Supine to Sit;Sit to Supine     Supine to sit: Max assist;HOB elevated Sit to supine: Max assist   General bed mobility comments: Poor movement initiation, limited by pain, swelling and weakness; maxA for BLE management and trunk  elevation, pt able to assist minimally with UE support (unable to maintain grip on bed rest); maxA for return to supine; totalA to scoot up in bed    Transfers Overall transfer level: Needs assistance Equipment used: Rolling walker (2 wheeled) Transfers: Sit to/from Stand Sit to Stand: Max assist         General transfer comment: Heavy maxA for trunk elevation standing from slightly elevated EOB to RW, pt with bilateral knees extended against bed with difficulty achieving full trunk extension; able to maintain nearly upright standign ~15-sec before return to sit  Ambulation/Gait             General Gait Details: unable this session  Stairs            Wheelchair Mobility    Modified Rankin (Stroke Patients Only)       Balance Overall balance assessment: Needs assistance Sitting-balance support: Bilateral upper extremity supported;Feet supported;No upper extremity supported Sitting balance-Leahy Scale: Fair       Standing balance-Leahy Scale: Zero Standing balance comment: Requires BUE support and maxA to maintain standing, BLEs braced against bed                             Pertinent Vitals/Pain Pain Assessment: Faces Faces Pain Scale: Hurts even more Pain Location: Bilateral lower legs Pain Descriptors / Indicators: Grimacing;Guarding;Moaning Pain Intervention(s): Monitored during session;Repositioned    Home Living Family/patient expects to be discharged to:: Skilled nursing facility Living Arrangements: Spouse/significant other Available Help at Discharge: Family;Available 24 hours/day Type of Home: House Home Access: Stairs to enter Entrance Stairs-Rails: Can reach both  Entrance Stairs-Number of Steps: 8 steps from garage which is most used entrance. Home Layout: Multi-level Home Equipment: Walker - 2 wheels;Cane - single point;Wheelchair - manual      Prior Function Level of Independence: Needs assistance   Gait / Transfers  Assistance Needed: Use of RW for household distances, w/c in community  ADL's / Homemaking Assistance Needed: Patient stating he needs some help with lower body self care.  ? use of shower given leg wounds.  Comments: No family in the room to validate info     Hand Dominance   Dominant Hand: Right    Extremity/Trunk Assessment   Upper Extremity Assessment Upper Extremity Assessment: RUE deficits/detail;LUE deficits/detail;Difficult to assess due to impaired cognition RUE Deficits / Details: Noted BUE swelling; observed functional strength <3/5 throughout RUE Coordination: decreased gross motor;decreased fine motor LUE Deficits / Details: Noted BUE swelling; observed functional strength <3/5 throughout LUE Coordination: decreased gross motor;decreased fine motor    Lower Extremity Assessment Lower Extremity Assessment: RLE deficits/detail;LLE deficits/detail;Difficult to assess due to impaired cognition RLE Deficits / Details: Noted BLE swelling, lower legs wrapped with redness/weeping; observed functional strength <3/5 throughout RLE: Unable to fully assess due to pain RLE Coordination: decreased gross motor LLE Deficits / Details: Noted BLE swelling, lower legs wrapped with redness/weeping; observed functional strength <3/5 throughout LLE: Unable to fully assess due to pain LLE Coordination: decreased gross motor    Cervical / Trunk Assessment Cervical / Trunk Assessment: Kyphotic  Communication   Communication: HOH  Cognition Arousal/Alertness: Lethargic;Awake/alert Behavior During Therapy: Flat affect Overall Cognitive Status: Difficult to assess Area of Impairment: Orientation;Attention;Memory;Following commands;Safety/judgement;Awareness;Problem solving                 Orientation Level: Disoriented to;Place;Time;Situation Current Attention Level: Focused;Sustained Memory: Decreased short-term memory Following Commands: Follows one step commands  inconsistently;Follows one step commands with increased time Safety/Judgement: Decreased awareness of deficits Awareness: Intellectual Problem Solving: Slow processing;Difficulty sequencing;Decreased initiation;Requires verbal cues;Requires tactile cues General Comments: Pt awakens to name and conversant, then closes eyes again requiring cues to attend to conversation/task. Aware he is in hospital, unable to state which one; states the year "22" but repeats this when asked month; wife's name is "Vinnie Level". Pt with increased time and inconsistencies following simple commands. Repeats "Chiquita Loth" when asked to perform task (ex. "sit up" "stand up") - but unable to explain why he says this      General Comments General comments (skin integrity, edema, etc.): SpO2 98% on 3L, reduced to 2L and maintaining 95%; HR 60s; return to supine BP 110/62    Exercises     Assessment/Plan    PT Assessment Patient needs continued PT services  PT Problem List Decreased strength;Decreased range of motion;Decreased activity tolerance;Decreased balance;Decreased mobility;Decreased cognition;Decreased knowledge of use of DME;Decreased safety awareness;Cardiopulmonary status limiting activity;Pain;Decreased skin integrity       PT Treatment Interventions DME instruction;Gait training;Stair training;Functional mobility training;Therapeutic activities;Therapeutic exercise;Balance training;Cognitive remediation;Patient/family education;Wheelchair mobility training    PT Goals (Current goals can be found in the Care Plan section)  Acute Rehab PT Goals Patient Stated Goal: Lay back down PT Goal Formulation: With patient Time For Goal Achievement: 04/30/21 Potential to Achieve Goals: Fair    Frequency Min 3X/week   Barriers to discharge        Co-evaluation               AM-PAC PT "6 Clicks" Mobility  Outcome Measure Help needed turning from your back to your side while  in a flat bed without using  bedrails?: A Lot Help needed moving from lying on your back to sitting on the side of a flat bed without using bedrails?: A Lot Help needed moving to and from a bed to a chair (including a wheelchair)?: Total Help needed standing up from a chair using your arms (e.g., wheelchair or bedside chair)?: Total Help needed to walk in hospital room?: Total Help needed climbing 3-5 steps with a railing? : Total 6 Click Score: 8    End of Session Equipment Utilized During Treatment: Gait belt;Oxygen Activity Tolerance: Patient limited by fatigue;Patient limited by pain;Patient limited by lethargy Patient left: in bed;with call bell/phone within reach;with bed alarm set Nurse Communication: Mobility status;Need for lift equipment PT Visit Diagnosis: Other abnormalities of gait and mobility (R26.89);Muscle weakness (generalized) (M62.81);Pain Pain - part of body: Leg;Ankle and joints of foot    Time: 1137-1201 PT Time Calculation (min) (ACUTE ONLY): 24 min   Charges:   PT Evaluation $PT Eval Moderate Complexity: 1 Mod PT Treatments $Therapeutic Activity: 8-22 mins   Mabeline Caras, PT, DPT Acute Rehabilitation Services  Pager [phone] Office [place] 04/16/2021, 1:04 PM

## 2021-04-16 NOTE — Progress Notes (Signed)
PROGRESS NOTE    Andrew [name]  BZJ:[id number] DOB: October 30, 1928 DOA: 04/13/2021 PCP: [name], MD   Brief Narrative:  Andrew [name] is a 87 y.o. male with medical history significant of combind HF, a fib on eliquis, hypothyroidism, HLD, PAD. Presenting with abdominal pain. History per wife as patient is a poor historian. Patient was originally seen this morning for abdominal pain. His wife reports that he's had RUQ ab pain for several months. It comes in waves. He's not seen a physician about it. He just dealt with it. It has apparently become worse over the last couple of days. The wife also noted that the patient has had a worsening of his leg wounds since he stopped going to the wound care center. He hasn't been to the center in at least 3 weeks. She says during that time, his legs have become more red and painful. He is not able to walk on his own at this point. She states that he stopped going just because he was tired of the hassle. She denies any other aggravating or alleviating factors. In ED: CT of abdomen and pelvis was revealing for cholelithiasis w/o acute inflammation. XR BLE was not concerning for osteo. CXR was concerning for CHF. He was found to have elevated tropinins and BNP. He was given vanc. Cardiology was consulted. [facility] was called for admission.  Assessment & Plan:   Active Problems:   Acute on chronic systolic CHF (congestive heart failure) (HCC)   Acute on chronic systolic congestive heart failure (HCC)   Cellulitis of left lower extremity   Goals of care discussion - Wife at bedside, lengthy discussion about patient's prognosis given advanced age, known advanced directives and living will per wife, patient has made wishes to be DNR, this will be updated - Wife hoping to speak to palliative care in the next 1 to 2 days given poor prognosis in the setting of worsening heart failure, worsening urine output despite aggressive and increased doses of Lasix. -  Patient's status continues to decline despite aggressive treatment as outlined below. We appreciate cardiology and nephrology's assistance with this difficult case  Acute on chronic combined HF Elevated troponin in setting of sepsis - Cardiology consulted appreciate insight and recommendations - BNP markedly elevated in the setting of AKI - Last echo 01/2021 showed EF 25% w/ global hypokinesis - Continue heparin gtt - holding home Eliquis - Repeat CXR pending late this afternoon  AKI on CKD3b - Creatinine continues to worsen in the setting of cardiorenal disease, ongoing diuresis and likely poor renal perfusion - Renal ultrasound/CT abdomen without any obstructive findings - Bladder scan continues to show minimal urine, low urine output today despite aggressive diuretics, patient remains hypervolemic  Acute on chronic hypoxic respiratory failure on 3L [place] - Normally on 2 - 3 L [place] at night only - Currently maintain sats on home dosing; follow - Cardiac wheeze noted today on exam -increase Lasix per cardiology and nephrology  Sepsis secondary to BLE cellulitis, POA resolving - Discontinue vancomycin in the setting of above AKI - Continue ceftriaxone - Follow cultures -rule out dissemination of infection preliminary negative - Wound care following - Sepsis criteria resolved - Procalcitonin markedly positive  Acute metabolic encephalopathy, multifactorial  - In the setting of above  Hypothyroidism - Continue synthroid  PAF on eliquis - Blood pressure downtrending overnight, hold carvedilol - Transition from Eliquis to heparin as above  Hx of HTN - Pressures are soft, hold home regimen  Hx of PAD, HLD - Continue zetia, lipitior  Previously reported abdominal pain, unspecified - Per previous facility patient had presented with abdominal pain, denies any abdominal pain or issues at this point - CT shows cholelithiasis without any inflammation, abdomen benign on exam  DVT  prophylaxis: Heparin Code Status: DNR, confirmed with wife at bedside, he has advanced directives/living will at home that she can bring in to confirm if necessary. Family Communication: Wife at bedside, over the phone  Status is: Inpatient  Dispo: The patient is from: Home              Anticipated d/c is to: To be determined              Anticipated d/c date is: >72 hours              Patient currently not medically stable for discharge  Consultants:   Cardiology, nephrology  Procedures:   None  Antimicrobials:  Vancomycin discontinued Ceftriaxone initiated 04/13/2021  Subjective: No acute issues or events overnight, creatinine continues to worsen as does urine output.  Grim prognosis, wife at bedside and updated.  Review of systems from patient markedly limited due to mental status changes given above.  Objective: Vitals:   04/15/21 2000 04/16/21 0000 04/16/21 0400 04/16/21 0500  BP: 105/70 108/67 (!) 112/59   Pulse: 77 65 68   Resp: 18 20 17    Temp: 98.4 F (36.9 C) 97.8 F (36.6 C)    TempSrc: Oral Oral    SpO2: 97% 97% 98%   Weight:    84.1 kg  Height:        Intake/Output Summary (Last 24 hours) at 04/16/2021 0755 Last data filed at 04/16/2021 0000 Gross per 24 hour  Intake 2233.95 ml  Output 525 ml  Net 1708.95 ml   Filed Weights   04/14/21 0034 04/15/21 0400 04/16/21 0500  Weight: 78.2 kg 81.2 kg 84.1 kg    Examination:  General exam: Appears calm and comfortable; awake being fed breakfast at bedside by staff Respiratory system: Clear to auscultation. Respiratory effort normal. Cardiovascular system: S1 & S2 heard, RRR. No JVD, murmurs, rubs, gallops or clicks. No pedal edema. Gastrointestinal system: Abdomen is nondistended, soft and nontender. No organomegaly or masses felt. Normal bowel sounds heard. Central nervous system: Without focal neurodeficit. Skin: No rashes   Data Reviewed: I have personally reviewed following labs and imaging  studies  CBC: Recent Labs  Lab 04/13/21 0124 04/14/21 0455 04/15/21 0546 04/16/21 0348  WBC 14.6* 16.7* 18.9* 19.5*  NEUTROABS 12.5* 14.1*  --   --   HGB 11.4* 10.2* 9.6* 9.1*  HCT 36.6* 33.0* 31.2* 29.0*  MCV 95.1 95.7 94.8 93.5  PLT 203 170 173 626   Basic Metabolic Panel: Recent Labs  Lab 04/13/21 0124 04/13/21 2332 04/14/21 0455 04/15/21 0500 04/16/21 0348  NA 141 137 139 136 132*  K 4.5 4.3 4.3 4.3 4.1  CL 106 104 104 103 99  CO2 23 22 22 24 23   GLUCOSE 144* 94 98 142* 163*  BUN 70* 74* 81* 89* 98*  CREATININE 2.31* 2.82* 2.91* 3.17* 3.39*  CALCIUM 8.9 8.4* 8.5* 8.3* 8.0*   GFR: Estimated Creatinine Clearance: 15 mL/min (A) (by C-G formula based on SCr of 3.39 mg/dL (H)). Liver Function Tests: Recent Labs  Lab 04/13/21 0124 04/14/21 0455  AST 40 101*  ALT 33 76*  ALKPHOS 92 77  BILITOT 1.1 1.1  PROT 6.8 5.8*  ALBUMIN 3.3* 2.7*  Recent Labs  Lab 04/13/21 0124  LIPASE 58*   No results for input(s): AMMONIA in the last 168 hours. Coagulation Profile: No results for input(s): INR, PROTIME in the last 168 hours. Cardiac Enzymes: No results for input(s): CKTOTAL, CKMB, CKMBINDEX, TROPONINI in the last 168 hours. BNP (last 3 results) No results for input(s): PROBNP in the last 8760 hours. HbA1C: No results for input(s): HGBA1C in the last 72 hours. CBG: Recent Labs  Lab 04/15/21 0608 04/15/21 1128 04/15/21 1557 04/15/21 2324 04/16/21 0610  GLUCAP 140* 205* 161* 143* 158*   Lipid Profile: No results for input(s): CHOL, HDL, LDLCALC, TRIG, CHOLHDL, LDLDIRECT in the last 72 hours. Thyroid Function Tests: No results for input(s): TSH, T4TOTAL, FREET4, T3FREE, THYROIDAB in the last 72 hours. Anemia Panel: No results for input(s): VITAMINB12, FOLATE, FERRITIN, TIBC, IRON, RETICCTPCT in the last 72 hours. Sepsis Labs: Recent Labs  Lab 04/14/21 1355  PROCALCITON 3.61    Recent Results (from the past 240 hour(s))  Blood culture (routine x 2)      Status: None (Preliminary result)   Collection Time: 04/13/21 10:41 AM   Specimen: Left Antecubital; Blood  Result Value Ref Range Status   Specimen Description   Final    LEFT ANTECUBITAL Performed at [facility] [address], [place], [place] [address]    Special Requests   Final    BOTTLES DRAWN AEROBIC AND ANAEROBIC Blood Culture adequate volume Performed at [facility] [address], [place], [place] [address]    Culture   Final    NO GROWTH 2 DAYS Performed at [facility] [address] [place], [name] [address]    Report Status PENDING  Incomplete  Blood culture (routine x 2)     Status: None (Preliminary result)   Collection Time: 04/13/21 10:51 AM   Specimen: Right Antecubital; Blood  Result Value Ref Range Status   Specimen Description   Final    RIGHT ANTECUBITAL Performed at [facility] [address], [place], [place] [address]    Special Requests   Final    BOTTLES DRAWN AEROBIC AND ANAEROBIC Blood Culture adequate volume Performed at [facility] [address] [place], [place] [address]    Culture   Final    NO GROWTH 2 DAYS Performed at [facility] [address], [place], [place] [address]    Report Status PENDING  Incomplete  Resp Panel by RT-PCR (Flu A&B, Covid) Nasopharyngeal Swab     Status: None   Collection Time: 04/13/21 10:59 AM   Specimen: Nasopharyngeal Swab; Nasopharyngeal(NP) swabs in vial transport medium  Result Value Ref Range Status   SARS Coronavirus 2 by RT PCR NEGATIVE NEGATIVE Final    Comment: (NOTE) SARS-CoV-2 target nucleic acids are NOT DETECTED.  The SARS-CoV-2 RNA is generally detectable in upper respiratory specimens during the acute phase of infection. The lowest concentration of SARS-CoV-2 viral copies this assay can detect is 138 copies/mL. A negative result does not preclude SARS-Cov-2 infection and should not be  used as the sole basis for treatment or other patient management decisions. A negative result may occur with  improper specimen collection/handling, submission of specimen other than nasopharyngeal swab, presence of viral mutation(s) within the areas targeted by this assay, and inadequate number of viral copies(<138 copies/mL). A negative result must be combined with clinical observations, patient history, and epidemiological information. The expected result is Negative.  Fact Sheet for  Patients:  EntrepreneurPulse.com.au  Fact Sheet for Healthcare Providers:  IncredibleEmployment.be  This test is no t yet approved or cleared by the [place] FDA and  has been authorized for detection and/or diagnosis of SARS-CoV-2 by FDA under an Emergency Use Authorization (EUA). This EUA will remain  in effect (meaning this test can be used) for the duration of the COVID-19 declaration under Section 564(b)(1) of the Act, 21 U.S.C.section 360bbb-3(b)(1), unless the authorization is terminated  or revoked sooner.       Influenza A by PCR NEGATIVE NEGATIVE Final   Influenza B by PCR NEGATIVE NEGATIVE Final    Comment: (NOTE) The Xpert Xpress SARS-CoV-2/FLU/RSV plus assay is intended as an aid in the diagnosis of influenza from Nasopharyngeal swab specimens and should not be used as a sole basis for treatment. Nasal washings and aspirates are unacceptable for Xpert Xpress SARS-CoV-2/FLU/RSV testing.  Fact Sheet for Patients: EntrepreneurPulse.com.au  Fact Sheet for Healthcare Providers: IncredibleEmployment.be  This test is not yet approved or cleared by the [place] FDA and has been authorized for detection and/or diagnosis of SARS-CoV-2 by FDA under an Emergency Use Authorization (EUA). This EUA will remain in effect (meaning this test can be used) for the duration of the COVID-19 declaration under Section  564(b)(1) of the Act, 21 U.S.C. section 360bbb-3(b)(1), unless the authorization is terminated or revoked.  Performed at [facility], [place] [address], [place], [place] [address]       Radiology Studies: US RENAL  Result Date: 04/15/2021 CLINICAL DATA:  Urinary retention EXAM: RENAL / URINARY TRACT ULTRASOUND COMPLETE COMPARISON:  04/13/2021 FINDINGS: Right Kidney: Renal measurements: 13.8 x 7.5 x 5.4 cm. = volume: 296 mL. Increased echogenicity is noted. Multiple cysts are noted the largest of which measures 4.5 cm in the lower pole of the right kidney. These changes are similar to that seen on the prior CT examination. No mass lesion or hydronephrosis is noted. Left Kidney: Renal measurements: 13.4 x 6.6 x 6.5 cm. = volume: 302 mL. Mild increased echogenicity is noted. Cysts are noted within the left kidney also similar to that seen on the prior CT examination. The largest of these measures 6.9 cm in greatest dimension. No obstructive changes are seen. Bladder: Partially distended. Other: None. IMPRESSION: Bilateral renal cysts similar to that seen on the prior CT examination. Mild increased echogenicity consistent with medical renal disease. No obstructive changes are noted. Electronically Signed   By: Inez Catalina M.D.   On: 04/15/2021 19:19    Scheduled Meds: . (feeding supplement) PROSource Plus  30 mL Oral BID BM  . amiodarone  100 mg Oral Daily  . aspirin  81 mg Oral Daily  . atorvastatin  80 mg Oral QPM  . Chlorhexidine Gluconate Cloth  6 each Topical Daily  . ezetimibe  10 mg Oral Daily  . FLUoxetine  20 mg Oral Daily  . furosemide  80 mg Intravenous BID  . gabapentin  100 mg Oral TID  . levothyroxine  100 mcg Oral QAC breakfast  . multivitamin with minerals  1 tablet Oral Daily  . silver sulfADIAZINE   Topical Daily  . sodium chloride flush  10-40 mL Intracatheter Q12H   Continuous Infusions: . cefTRIAXone (ROCEPHIN)  IV 1 g (04/15/21 1601)  . heparin  1,050 Units/hr (04/15/21 2300)  . milrinone 0.25 mcg/kg/min (04/16/21 0337)     LOS: 3 days   Time spent: 47min  Aniylah Avans C Darell Saputo, DO [facility]  If 7PM-7AM, please  contact night-coverage [email or web address]  04/16/2021, 7:55 AM

## 2021-04-16 NOTE — Progress Notes (Signed)
   04/16/21 0830  Mobility  Activity Contraindicated/medical hold (Not appropritate for mobility specialist)

## 2021-04-16 NOTE — Progress Notes (Signed)
Pt spouse at bedside

## 2021-04-16 NOTE — Care Management Important Message (Signed)
Important Message  Patient Details  Name: Andrew [name] MRN: [id number] Date of Birth: August 31, 1928   Medicare Important Message Given:  Yes     Shelda Altes 04/16/2021, 3:28 PM

## 2021-04-16 NOTE — Consult Note (Signed)
[facility] Consult Note     Date: 04/16/2021                  Patient Name:  Andrew [name]  MRN: [id number]  DOB: [birth year]  Age / Sex: 87 y.o., male         PCP: [name], MD                 Service Requesting Consult: Internal Medicine                 Reason for Consult: Increasing creatinine            Chief Complaint: abdominal pain HPI:  Andrew [name] is a 33 yom with extensive PMH significant for CKD stage 4, CHF, COPD, diabetes mellitus, HTN and HLD who presented to the ED on 4/19 c/o abdominal pain and was transferred to [facility] from [place] ED to be admitted for elevated troponins (possible NSTEMI) and BLE cellulitis.  Per chart review, the patient's wife reported the patient had been experiencing RUQ abdominal pain for several months intermittently, but it became worse over the few days prior to presentation. She also noted the patient has had worsening leg wounds, becoming more red and painful, since he stopped going to the wound care center approx 3 weeks ago. He is no longer able to walk on his own due to the discomfort. CT of abdomen and pelvis revealed cholelithiasis without acute inflammation; XR of BLE was not concerning for osteomyelitis; CXR was concerning for CHF.  Patient's creatinine levels have been steadily increasing since admission. Renal US completed was unremarkable for acute process. UOP has been low at < 700 mL daily. BP has remained stable in the low 100's.   Speaking with the patient this morning, he expressed surprised at his CKD diagnosis, stating "I didn't know I had that." He denies ever seeing a nephrologist as far as he can remember. When asked how he is feeling, he responds "I can't complain" and denies having any current issues. He has to be spoken to loudly, presumably hard of hearing. Questionable historian; he denies any current difficulty breathing despite some abdominal accessory muscle use and audible  wheezing.   Past Medical History:  Diagnosis Date  . AAA (abdominal aortic aneurysm) ([place])   . Anemia   . Arthritis   . CAD (coronary artery disease)   . Cancer ([place])    Melanoma - Back  . Carotid artery stenosis   . CHF (congestive heart failure) ([place])   . COPD (chronic obstructive pulmonary disease) ([place])   . Cyst of kidney, acquired   . Depression   . Diabetes mellitus   . DVT (deep venous thrombosis) ([place])   . Dysrhythmia   . Fatty liver 2008  . GERD (gastroesophageal reflux disease)   . Hyperlipidemia   . Hypertension   . IBS (irritable bowel syndrome)   . LVF (left ventricular failure) ([place])   . Paroxysmal atrial fibrillation (H[facility])   . Pneumonia Jan. 2014  . Shortness of breath dyspnea   . Thyroid disease   . Typical atrial flutter ([place])   . UTI (urinary tract infection) 05/2015   While in [place]   . Vitamin D deficiency     Past Surgical History:  Procedure Laterality Date  . ABDOMINAL AORTIC ANEURYSM REPAIR  2002  . ABDOMINAL AORTOGRAM W/LOWER EXTREMITY Left 08/10/2020   Procedure: ABDOMINAL AORTOGRAM W/LOWER EXTREMITY;  Surgeon: Waynetta Sandy, MD;  Location:  [facility];  Service: Cardiovascular;  Laterality: Left;  . BIOPSY  06/10/2020   Procedure: BIOPSY;  Surgeon: Ronnette Juniper, MD;  Location: [place];  Service: Gastroenterology;;  . CARDIAC CATHETERIZATION    . CORONARY ANGIOPLASTY    . CORONARY ANGIOPLASTY WITH STENT PLACEMENT  2005  . CORONARY ARTERY BYPASS GRAFT  1992  . ESOPHAGEAL BRUSHING  06/10/2020   Procedure: ESOPHAGEAL BRUSHING;  Surgeon: Ronnette Juniper, MD;  Location: [place];  Service: Gastroenterology;;  . ESOPHAGOGASTRODUODENOSCOPY N/A 06/10/2020   Procedure: ESOPHAGOGASTRODUODENOSCOPY (EGD);  Surgeon: Ronnette Juniper, MD;  Location: [place];  Service: Gastroenterology;  Laterality: N/A;  . EYE SURGERY     [place]  . HERNIA REPAIR    . INGUINAL HERNIA REPAIR Bilateral    w/mesh  . INSERTION OF MESH N/A 11/28/2017    Procedure: INSERTION OF MESH;  Surgeon: Donnie Mesa, MD;  Location: [place];  Service: General;  Laterality: N/A;  GENERAL AND TAP BLOCK  . KNEE SURGERY Bilateral   . LAPAROSCOPIC INGUINAL HERNIA WITH UMBILICAL HERNIA Bilateral 11/28/2017   Procedure: LAPAROSCOPIC BILATERAL INGUINAL HERNIA REPAIR WITH MESH, UMBILICAL HERNIA REPAIR WITH MESH;  Surgeon: Donnie Mesa, MD;  Location: [place];  Service: General;  Laterality: Bilateral;  GENERAL AND TAP BLOCK  . LEFT HEART CATHETERIZATION WITH CORONARY ANGIOGRAM N/A 10/30/2014   Procedure: LEFT HEART CATHETERIZATION WITH CORONARY ANGIOGRAM;  Surgeon: Larey Dresser, MD;  Location: [facility];  Service: Cardiovascular;  Laterality: N/A;  . QUADRICEPS TENDON REPAIR Bilateral 08/2003   Archie Endo 05/10/2011  . UMBILICAL HERNIA REPAIR  11/28/2017   w/mesh    Family History  Problem Relation Age of Onset  . Colon cancer Father   . Coronary artery disease Brother   . Diabetes Brother   . Heart disease Brother   . Hyperlipidemia Brother   . Peripheral vascular disease Brother        Varicose Veins  . Throat cancer Brother        abdominal cancer?   . Diabetes Son   . Hyperlipidemia Son    Social History:  reports that he has never smoked. He has never used smokeless tobacco. He reports that he does not drink alcohol and does not use drugs.  Allergies:  Allergies  Allergen Reactions  . Metoprolol Shortness Of Breath, Palpitations and Other (See Comments)    Heart starts racing. Shallow breathing; pt states he also get skin irritation on his legs  . Metformin Hives and Swelling    On legs  . Metformin And Related Nausea And Vomiting    Medications Prior to Admission  Medication Sig Dispense Refill  . acetaminophen (TYLENOL) 500 MG tablet Take 2 tablets (1,000 mg total) by mouth every 8 (eight) hours as needed. 30 tablet 0  . amiodarone (PACERONE) 200 MG tablet Take 0.5 tablets (100 mg total) by mouth daily. 45 tablet 3  . atorvastatin  (LIPITOR) 80 MG tablet Take 1 tablet (80 mg total) by mouth daily. (Patient taking differently: Take 80 mg by mouth every evening.) 30 tablet 3  . carvedilol (COREG) 6.25 MG tablet Take 6.25 mg by mouth 2 (two) times daily with a meal.    . ELIQUIS 2.5 MG TABS tablet TAKE 1 TABLET BY MOUTH TWICE DAILY. (Patient taking differently: Take 2.5 mg by mouth 2 (two) times daily.) 180 tablet 1  . empagliflozin (JARDIANCE) 10 MG TABS tablet Take 1 tablet (10 mg total) by mouth daily before breakfast. 90 tablet 3  . ezetimibe (  ZETIA) 10 MG tablet TAKE (1) TABLET DAILY. (Patient taking differently: Take 10 mg by mouth daily.) 90 tablet 3  . FLUoxetine (PROZAC) 20 MG capsule TAKE (1) CAPSULE DAILY. (Patient taking differently: Take 20 mg by mouth daily.) 90 capsule 0  . gabapentin (NEURONTIN) 100 MG capsule Take 100 mg by mouth 3 (three) times daily.    . hydrALAZINE (APRESOLINE) 50 MG tablet Take 1 tablet (50 mg total) by mouth 3 (three) times daily. 90 tablet 3  . isosorbide mononitrate (IMDUR) 30 MG 24 hr tablet TAKE 1 TABLET EACH DAY. (Patient taking differently: Take 30 mg by mouth daily.) 30 tablet 3  . levothyroxine (SYNTHROID) 100 MCG tablet Take 1 tablet (100 mcg total) by mouth daily before breakfast. 90 tablet 3  . Multiple Vitamins-Minerals (MULTIVITAMIN ADULTS 50+) TABS Take 1 tablet by mouth daily in the afternoon.    . nitroGLYCERIN (NITROSTAT) 0.4 MG SL tablet DISSOLVE 1 TABLET UNDER TONGUE AS NEEDED FOR CHEST PAIN,MAY REPEAT IN5 MINUTES FOR 2 DOSES. (Patient taking differently: Place 0.4 mg under the tongue every 5 (five) minutes as needed for chest pain.) 25 tablet 3  . pantoprazole (PROTONIX) 40 MG tablet Take 1 tablet (40 mg total) by mouth 2 (two) times daily.    . potassium chloride SA (KLOR-CON) 20 MEQ tablet TAKE 1 TABLET ONCE DAILY. (Patient taking differently: Take 20 mEq by mouth daily.) 90 tablet 3  . tiotropium (SPIRIVA) 18 MCG inhalation capsule Place 18 mcg into inhaler and inhale  daily as needed (for wheezing).    . torsemide (DEMADEX) 20 MG tablet Take 4 tablets (80 mg total) by mouth every morning AND 2 tablets (40 mg total) every evening. (Patient taking differently: Take 4 tablets (80 mg total) by mouth every morning as of 3/15.) 180 tablet 11  . metolazone (ZAROXOLYN) 2.5 MG tablet Take 1 tablet (2.5 mg total) by mouth as directed. By [facility] Clinic (Patient not taking: No sig reported) 3 tablet 0    Results for orders placed or performed during the hospital encounter of 04/13/21 (from the past 48 hour(s))  Heparin level (unfractionated)     Status: Abnormal   Collection Time: 04/14/21 12:49 PM  Result Value Ref Range   Heparin Unfractionated 0.82 (H) 0.30 - 0.70 IU/mL    Comment: (NOTE) The clinical reportable range upper limit is being lowered to >1.10 to align with the FDA approved guidance for the current laboratory assay.  If heparin results are below expected values, and patient dosage has  been confirmed, suggest follow up testing of antithrombin III levels. Performed at [facility], [place] [address], [place], [place] [address]   APTT     Status: Abnormal   Collection Time: 04/14/21 12:49 PM  Result Value Ref Range   aPTT 62 (H) 24 - 36 seconds    Comment:        IF BASELINE aPTT IS ELEVATED, SUGGEST PATIENT RISK ASSESSMENT BE USED TO DETERMINE APPROPRIATE ANTICOAGULANT THERAPY. Performed at [facility], [place] [address] [place], [place] [address]   Procalcitonin - Baseline     Status: None   Collection Time: 04/14/21  1:55 PM  Result Value Ref Range   Procalcitonin 3.61 ng/mL    Comment:        Interpretation: PCT > 2 ng/mL: Systemic infection (sepsis) is likely, unless other causes are known. (NOTE)       Sepsis PCT Algorithm           Lower  Respiratory Tract                                      Infection PCT Algorithm    ----------------------------     ----------------------------         PCT < 0.25 ng/mL                PCT <  0.10 ng/mL          Strongly encourage             Strongly discourage   discontinuation of antibiotics    initiation of antibiotics    ----------------------------     -----------------------------       PCT 0.25 - 0.50 ng/mL            PCT 0.10 - 0.25 ng/mL               OR       >80% decrease in PCT            Discourage initiation of                                            antibiotics      Encourage discontinuation           of antibiotics    ----------------------------     -----------------------------         PCT >= 0.50 ng/mL              PCT 0.26 - 0.50 ng/mL               AND       <80% decrease in PCT              Encourage initiation of                                             antibiotics       Encourage continuation           of antibiotics    ----------------------------     -----------------------------        PCT >= 0.50 ng/mL                  PCT > 0.50 ng/mL               AND         increase in PCT                  Strongly encourage                                      initiation of antibiotics    Strongly encourage escalation           of antibiotics                                     -----------------------------  PCT <= 0.25 ng/mL                                                 OR                                        > 80% decrease in PCT                                      Discontinue / Do not initiate                                             antibiotics  Performed at [facility], [place] [address], [place], [place] [address]   Glucose, capillary     Status: Abnormal   Collection Time: 04/14/21  9:13 PM  Result Value Ref Range   Glucose-Capillary 140 (H) 70 - 99 mg/dL    Comment: Glucose reference range applies only to samples taken after fasting for at least 8 hours.  Basic metabolic panel     Status: Abnormal   Collection Time: 04/15/21  5:00 AM  Result Value Ref Range   Sodium 136 135 -  145 mmol/L   Potassium 4.3 3.5 - 5.1 mmol/L   Chloride 103 98 - 111 mmol/L   CO2 24 22 - 32 mmol/L   Glucose, Bld 142 (H) 70 - 99 mg/dL    Comment: Glucose reference range applies only to samples taken after fasting for at least 8 hours.   BUN 89 (H) 8 - 23 mg/dL   Creatinine, Ser 3.17 (H) 0.61 - 1.24 mg/dL   Calcium 8.3 (L) 8.9 - 10.3 mg/dL   GFR, Estimated 18 (L) >60 mL/min    Comment: (NOTE) Calculated using the CKD-EPI Creatinine Equation (2021)    Anion gap 9 5 - 15    Comment: Performed at [place] [address], [place], [place] [address]  Heparin level (unfractionated)     Status: None   Collection Time: 04/15/21  5:00 AM  Result Value Ref Range   Heparin Unfractionated 0.63 0.30 - 0.70 IU/mL    Comment: (NOTE) The clinical reportable range upper limit is being lowered to >1.10 to align with the FDA approved guidance for the current laboratory assay.  If heparin results are below expected values, and patient dosage has  been confirmed, suggest follow up testing of antithrombin III levels. Performed at [facility], [place] [address], [place], [place] [address]   APTT     Status: Abnormal   Collection Time: 04/15/21  5:00 AM  Result Value Ref Range   aPTT 43 (H) 24 - 36 seconds    Comment:        IF BASELINE aPTT IS ELEVATED, SUGGEST PATIENT RISK ASSESSMENT BE USED TO DETERMINE APPROPRIATE ANTICOAGULANT THERAPY. Performed at [facility], [place] [address], [place], [place] [address]   CBC     Status: Abnormal   Collection Time: 04/15/21  5:46 AM  Result Value Ref Range   WBC  18.9 (H) 4.0 - 10.5 K/uL   RBC 3.29 (L) 4.22 - 5.81 MIL/uL   Hemoglobin 9.6 (L) 13.0 - 17.0 g/dL   HCT 31.2 (L) 39.0 - 52.0 %   MCV 94.8 80.0 - 100.0 fL   MCH 29.2 26.0 - 34.0 pg   MCHC 30.8 30.0 - 36.0 g/dL   RDW 14.9 11.5 - 15.5 %   Platelets 173 150 - 400 K/uL   nRBC 0.0 0.0 - 0.2 %    Comment: Performed at [facility], [place] [address], [place], [place]  [address]  Glucose, capillary     Status: Abnormal   Collection Time: 04/15/21  6:08 AM  Result Value Ref Range   Glucose-Capillary 140 (H) 70 - 99 mg/dL    Comment: Glucose reference range applies only to samples taken after fasting for at least 8 hours.  .Cooxemetry Panel (carboxy, met, total hgb, O2 sat)     Status: Abnormal   Collection Time: 04/15/21 10:12 AM  Result Value Ref Range   Total hemoglobin 10.0 (L) 12.0 - 16.0 g/dL   O2 Saturation 63.3 %   Carboxyhemoglobin 1.4 0.5 - 1.5 %   Methemoglobin 1.1 0.0 - 1.5 %    Comment: Performed at [facility] [address] [place], [place] [address]  Glucose, capillary     Status: Abnormal   Collection Time: 04/15/21 11:28 AM  Result Value Ref Range   Glucose-Capillary 205 (H) 70 - 99 mg/dL    Comment: Glucose reference range applies only to samples taken after fasting for at least 8 hours.  Glucose, capillary     Status: Abnormal   Collection Time: 04/15/21  3:57 PM  Result Value Ref Range   Glucose-Capillary 161 (H) 70 - 99 mg/dL    Comment: Glucose reference range applies only to samples taken after fasting for at least 8 hours.  Glucose, capillary     Status: Abnormal   Collection Time: 04/15/21 11:24 PM  Result Value Ref Range   Glucose-Capillary 143 (H) 70 - 99 mg/dL    Comment: Glucose reference range applies only to samples taken after fasting for at least 8 hours.  Basic metabolic panel     Status: Abnormal   Collection Time: 04/16/21  3:48 AM  Result Value Ref Range   Sodium 132 (L) 135 - 145 mmol/L   Potassium 4.1 3.5 - 5.1 mmol/L   Chloride 99 98 - 111 mmol/L   CO2 23 22 - 32 mmol/L   Glucose, Bld 163 (H) 70 - 99 mg/dL    Comment: Glucose reference range applies only to samples taken after fasting for at least 8 hours.   BUN 98 (H) 8 - 23 mg/dL   Creatinine, Ser 3.39 (H) 0.61 - 1.24 mg/dL   Calcium 8.0 (L) 8.9 - 10.3 mg/dL   GFR, Estimated 17 (L) >60 mL/min    Comment: (NOTE) Calculated using the CKD-EPI  Creatinine Equation (2021)    Anion gap 10 5 - 15    Comment: Performed at [facility] [address] [place], [place] [address]  Heparin level (unfractionated)     Status: None   Collection Time: 04/16/21  3:48 AM  Result Value Ref Range   Heparin Unfractionated 0.56 0.30 - 0.70 IU/mL    Comment: (NOTE) The clinical reportable range upper limit is being lowered to >1.10 to align with the FDA approved guidance for the current laboratory assay.  If heparin results are below expected  values, and patient dosage has  been confirmed, suggest follow up testing of antithrombin III levels. Performed at [facility], [place] [address], [place], [place] [address]   CBC     Status: Abnormal   Collection Time: 04/16/21  3:48 AM  Result Value Ref Range   WBC 19.5 (H) 4.0 - 10.5 K/uL   RBC 3.10 (L) 4.22 - 5.81 MIL/uL   Hemoglobin 9.1 (L) 13.0 - 17.0 g/dL   HCT 29.0 (L) 39.0 - 52.0 %   MCV 93.5 80.0 - 100.0 fL   MCH 29.4 26.0 - 34.0 pg   MCHC 31.4 30.0 - 36.0 g/dL   RDW 15.2 11.5 - 15.5 %   Platelets 156 150 - 400 K/uL   nRBC 0.2 0.0 - 0.2 %    Comment: Performed at [facility], [place] [address] [place], [place] [address]  .Cooxemetry Panel (carboxy, met, total hgb, O2 sat)     Status: Abnormal   Collection Time: 04/16/21  4:57 AM  Result Value Ref Range   Total hemoglobin 9.4 (L) 12.0 - 16.0 g/dL   O2 Saturation 64.7 %   Carboxyhemoglobin 1.4 0.5 - 1.5 %   Methemoglobin 0.8 0.0 - 1.5 %    Comment: Performed at [place] [address], [place], [place] [address]  Glucose, capillary     Status: Abnormal   Collection Time: 04/16/21  6:10 AM  Result Value Ref Range   Glucose-Capillary 158 (H) 70 - 99 mg/dL    Comment: Glucose reference range applies only to samples taken after fasting for at least 8 hours.   *Note: Due to a large number of results and/or encounters for the requested time period, some results have not been displayed. A complete set of results  can be found in Results Review.   US RENAL  Result Date: 04/15/2021 CLINICAL DATA:  Urinary retention EXAM: RENAL / URINARY TRACT ULTRASOUND COMPLETE COMPARISON:  04/13/2021 FINDINGS: Right Kidney: Renal measurements: 13.8 x 7.5 x 5.4 cm. = volume: 296 mL. Increased echogenicity is noted. Multiple cysts are noted the largest of which measures 4.5 cm in the lower pole of the right kidney. These changes are similar to that seen on the prior CT examination. No mass lesion or hydronephrosis is noted. Left Kidney: Renal measurements: 13.4 x 6.6 x 6.5 cm. = volume: 302 mL. Mild increased echogenicity is noted. Cysts are noted within the left kidney also similar to that seen on the prior CT examination. The largest of these measures 6.9 cm in greatest dimension. No obstructive changes are seen. Bladder: Partially distended. Other: None. IMPRESSION: Bilateral renal cysts similar to that seen on the prior CT examination. Mild increased echogenicity consistent with medical renal disease. No obstructive changes are noted. Electronically Signed   By: Inez Catalina M.D.   On: 04/15/2021 19:19    Review of Systems  Constitutional: Negative for chills and fever.  Respiratory: Positive for wheezing. Negative for shortness of breath.   Cardiovascular: Negative for chest pain.  Gastrointestinal: Negative for abdominal pain, nausea and vomiting.  Genitourinary: Negative for dysuria.  Skin: Negative for itching.  Neurological: Negative for headaches.    Blood pressure 112/62, pulse 67, temperature 97.9 F (36.6 C), temperature source Oral, resp. rate 18, height 5' 7"  (1.702 m), weight 84.1 kg, SpO2 96 %. Physical Exam HENT:     Ears:     Comments: Hard of hearing Cardiovascular:     Rate and Rhythm: Normal rate.  Heart sounds: No murmur heard. No friction rub.  Pulmonary:     Breath sounds: Wheezing present. No rales.     Comments: Significant expiratory wheezing in all fields. Some abdominal muscle use  noted with forced expiration. Pt is on 3 lpm O2 [place]. Abdominal:     General: Bowel sounds are normal. There is no distension.     Palpations: Abdomen is soft.  Musculoskeletal:     Right lower leg: Edema present.     Left lower leg: Edema present.  Skin:    General: Skin is warm and dry.  Neurological:     Mental Status: He is alert.  Psychiatric:        Mood and Affect: Mood normal.      Assessment/Plan 1. AKI on CKD stage 4 - Creatinine has increased since admission 2.8 -> 2.91 -> 3.17 -> 3.4. Baseline around 2. - Renal US shows bilateral renal cysts unchanged from prior CT, no acute changes noted. - UOP poor, < 700 mL daily so far despite Lasix bid. Net I/O yesterday was (+) 2226.3. Weight has increased from 78.2 kg -> 84.1 kg. - Increase Lasix to 160 mg IV bid to try and improve diuresis. - Restrict fluids, continue monitoring I/Os closely; daily weights - Obtain TSH - Potassium ok at 4.1, monitor closely with diuresis - Mg level pending - Monitor renal function panel - Avoid nephrotoxins, including NSAIDs and contrast dye  2. Anemia of CKD - Hgb has decreased from 11.4 -> 9.1. MCV normocytic. - Obtain iron studies  3. MBD of CKD - Calcium lab value 8.0, but corrected calcium in setting of hypoalbuminemia is 9.0 - Continue to monitor - Obtain PTH and Phos levels    Teofilo Pod 04/16/2021, 9:24 AM

## 2021-04-16 NOTE — TOC Progression Note (Signed)
Transition of Care [facility]) - Progression Note    Patient Details  Name: Andrew [name] MRN: [id number] Date of Birth: [birth year]  [facility] CM/SW Contact  Zenon Mayo, RN Phone Number: 04/16/2021, 4:20 PM  Clinical Narrative:    Milrinone drip and heparin started, conts on lasix drip worsening cret, cvp 14-15. Palliative consulted. TOC will cont to follow for dc needs.        Expected Discharge Plan and Services                                                 Social Determinants of Health (SDOH) Interventions    Readmission Risk Interventions Readmission Risk Prevention Plan 06/08/2020 07/04/2019  Transportation Screening Complete Complete  PCP or Specialist Appt within 3-5 Days Not Complete Complete  Not Complete comments disposition pending- likely SNF -  [place] or [place] Complete Complete  Social Work Consult for [place] Planning/Counseling Complete Complete  Palliative Care Screening Not Applicable Complete  Medication Review [employer] photographer) Referral to Pharmacy Complete  Some recent data might be hidden

## 2021-04-16 NOTE — Progress Notes (Incomplete)
ANTICOAGULATION + VANCOMYCIN CONSULT NOTE - Follow Up Consult  Pharmacy Consult for Heparin Indication: NSTEMI  Allergies  Allergen Reactions  . Metoprolol Shortness Of Breath, Palpitations and Other (See Comments)    Heart starts racing. Shallow breathing; pt states he also get skin irritation on his legs  . Metformin Hives and Swelling    On legs  . Metformin And Related Nausea And Vomiting    Patient Measurements: Height: 5\' 7"  (170.2 cm) Weight: 84.1 kg (185 lb 6.5 oz) IBW/kg (Calculated) : 66.1 Heparin Dosing Weight: 78.2 kg  Vital Signs: Temp: 97.9 F (36.6 C) (04/22 0818) Temp Source: Oral (04/22 0818) BP: 112/62 (04/22 0818) Pulse Rate: 67 (04/22 0818)  Labs: Recent Labs    04/13/21 1228 04/13/21 1516 04/13/21 1654 04/13/21 1926 04/13/21 2332 04/14/21 0154 04/14/21 0155 04/14/21 0455 04/14/21 1249 04/15/21 0500 04/15/21 0546 04/16/21 0348  HGB  --   --   --   --   --   --    < > 10.2*  --   --  9.6* 9.1*  HCT  --   --   --   --   --   --   --  33.0*  --   --  31.2* 29.0*  PLT  --   --   --   --   --   --   --  170  --   --  173 156  APTT  --    < >  --   --   --  79*  --   --  62* 43*  --   --   HEPARINUNFRC  --    < >  --   --   --   --    < >  --  0.82* 0.63  --  0.56  CREATININE  --   --   --   --    < >  --   --  2.91*  --  3.17*  --  3.39*  TROPONINIHS 1,945*  --  3,107* 4,008*  --   --   --   --   --   --   --   --    < > = values in this interval not displayed.    Estimated Creatinine Clearance: 15 mL/min (A) (by C-G formula based on SCr of 3.39 mg/dL (H)).  Assessment:  Patient is a 87 y.o M with hx CAD (s/p CABG), ischemic cardiomyopathy, CVA and afib on Eliquis PTA, presented to the ED on 4/19 with c/o abdominal pain and decreased appetite.  He was found to have diffuse erythema of the LE and elevated troponin.  Pharmacy  consulted for heparin drip for NSTEMI and Vancomycin for cellulitis.  Heparin level now down to 0.6 on Heparin 1050  units/hr. No bleeding issues noted. CBC stable.   Goal of Therapy:  Heparin level 0.3-0.7 units/ml aPTT 66-102 seconds Monitor platelets by anticoagulation protocol: Yes    Plan:  Continue heparin drip at 1050 units/hr. Heparin level and CBC in am.  Erin Hearing PharmD., BCPS Clinical Pharmacist 04/16/2021 8:47 AM

## 2021-04-16 NOTE — Progress Notes (Addendum)
Patient ID: REEVES MUSICK, male   DOB: 1928/09/18, 87 y.o.   MRN: [id number]     Advanced Heart Failure Rounding Note  PCP-Cardiologist: Loralie Champagne, MD   Subjective:    Remains on milrinone 0.25 mcg + IV lasix.     Creatinine 2.8 => 2.91 => 3.17=>3.4   Afebrile, WBCs up to 19.5 .    Remains in A fib.   No complaints.    Objective:   Weight Range: 84.1 kg Body mass index is 29.04 kg/m.   Vital Signs:   Temp:  [97.8 F (36.6 C)-98.5 F (36.9 C)] 97.9 F (36.6 C) (04/22 0818) Pulse Rate:  [65-77] 67 (04/22 0818) Resp:  [17-20] 18 (04/22 0818) BP: (105-121)/(58-70) 112/62 (04/22 0818) SpO2:  [96 %-98 %] 96 % (04/22 0818) Weight:  [84.1 kg] 84.1 kg (04/22 0500) Last BM Date: 04/13/21  Weight change: Filed Weights   04/14/21 0034 04/15/21 0400 04/16/21 0500  Weight: 78.2 kg 81.2 kg 84.1 kg    Intake/Output:   Intake/Output Summary (Last 24 hours) at 04/16/2021 0842 Last data filed at 04/16/2021 0838 Gross per 24 hour  Intake 2473.95 ml  Output 745 ml  Net 1728.95 ml      Physical Exam   CVP 13-14  General:  No resp difficulty. Appears frail. HEENT: normal Neck: suppleJVP to jaw  Carotids 2+ bilat; no bruits. No lymphadenopathy or thryomegaly appreciated. Cor: PMI nondisplaced. Irregular rate & rhythm. No rubs, gallops or murmurs. Lungs: EW throughout Abdomen: soft, nontender, nondistended. No hepatosplenomegaly. No bruits or masses. Good bowel sounds. Extremities: no cyanosis, clubbing, rash, R and LLE 2-3+ edema dressing in place  Neuro: alert & orientedx3, cranial nerves grossly intact. moves all 4 extremities w/o difficulty. Affect pleasant    Telemetry    A fib 60s    Labs    CBC Recent Labs    04/14/21 0455 04/15/21 0546 04/16/21 0348  WBC 16.7* 18.9* 19.5*  NEUTROABS 14.1*  --   --   HGB 10.2* 9.6* 9.1*  HCT 33.0* 31.2* 29.0*  MCV 95.7 94.8 93.5  PLT 170 173 409   Basic Metabolic Panel Recent Labs    04/15/21 0500  04/16/21 0348  NA 136 132*  K 4.3 4.1  CL 103 99  CO2 24 23  GLUCOSE 142* 163*  BUN 89* 98*  CREATININE 3.17* 3.39*  CALCIUM 8.3* 8.0*   Liver Function Tests Recent Labs    04/14/21 0455  AST 101*  ALT 76*  ALKPHOS 77  BILITOT 1.1  PROT 5.8*  ALBUMIN 2.7*   No results for input(s): LIPASE, AMYLASE in the last 72 hours. Cardiac Enzymes No results for input(s): CKTOTAL, CKMB, CKMBINDEX, TROPONINI in the last 72 hours.  BNP: BNP (last 3 results) Recent Labs    06/03/20 0604 02/20/21 0313 04/13/21 1051  BNP 1,981.3* 1,833.3* >4,500.0*    ProBNP (last 3 results) No results for input(s): PROBNP in the last 8760 hours.   D-Dimer No results for input(s): DDIMER in the last 72 hours. Hemoglobin A1C No results for input(s): HGBA1C in the last 72 hours. Fasting Lipid Panel No results for input(s): CHOL, HDL, LDLCALC, TRIG, CHOLHDL, LDLDIRECT in the last 72 hours. Thyroid Function Tests No results for input(s): TSH, T4TOTAL, T3FREE, THYROIDAB in the last 72 hours.  Invalid input(s): FREET3  Other results:   Imaging    US RENAL  Result Date: 04/15/2021 CLINICAL DATA:  Urinary retention EXAM: RENAL / URINARY TRACT ULTRASOUND COMPLETE COMPARISON:  04/13/2021  FINDINGS: Right Kidney: Renal measurements: 13.8 x 7.5 x 5.4 cm. = volume: 296 mL. Increased echogenicity is noted. Multiple cysts are noted the largest of which measures 4.5 cm in the lower pole of the right kidney. These changes are similar to that seen on the prior CT examination. No mass lesion or hydronephrosis is noted. Left Kidney: Renal measurements: 13.4 x 6.6 x 6.5 cm. = volume: 302 mL. Mild increased echogenicity is noted. Cysts are noted within the left kidney also similar to that seen on the prior CT examination. The largest of these measures 6.9 cm in greatest dimension. No obstructive changes are seen. Bladder: Partially distended. Other: None. IMPRESSION: Bilateral renal cysts similar to that seen on  the prior CT examination. Mild increased echogenicity consistent with medical renal disease. No obstructive changes are noted. Electronically Signed   By: Inez Catalina M.D.   On: 04/15/2021 19:19     Medications:     Scheduled Medications: . (feeding supplement) PROSource Plus  30 mL Oral BID BM  . amiodarone  100 mg Oral Daily  . aspirin  81 mg Oral Daily  . atorvastatin  80 mg Oral QPM  . Chlorhexidine Gluconate Cloth  6 each Topical Daily  . ezetimibe  10 mg Oral Daily  . FLUoxetine  20 mg Oral Daily  . furosemide  80 mg Intravenous BID  . gabapentin  100 mg Oral TID  . levothyroxine  100 mcg Oral QAC breakfast  . multivitamin with minerals  1 tablet Oral Daily  . silver sulfADIAZINE   Topical Daily  . sodium chloride flush  10-40 mL Intracatheter Q12H    Infusions: . cefTRIAXone (ROCEPHIN)  IV 1 g (04/15/21 1601)  . heparin 1,050 Units/hr (04/15/21 2300)  . milrinone 0.25 mcg/kg/min (04/16/21 0337)    PRN Medications: acetaminophen, nitroGLYCERIN, ondansetron (ZOFRAN) IV, sodium chloride flush     Assessment/Plan   1. ID: Possible sepsis syndrome from lower extremity cellulitis, has not been compliant with wound clinic.  Plain films of LEs negative for osteomyelitis.  PCT 3.61 but in setting of AKI. WBCs up to 19.  - abx per TR[facility], on ceftriaxone   - Blood cultures NGTD.  - Hydralazine/Imdur on hold with soft BP.  2. Acute on Chronic Systolic CHF: Ischemic cardiomyopathy: EF 40-45% on 3/18 echo. Echo 8/20 with EF 45-50%, normal RV. Echo in 9/21 with EF 40-45%, mild mitral stenosis.Recent admit 2/22 for a/c CHF. Repeat echo EF lower, 25-30%, RV ok. Cath declined due to age and lack of ischemic CP. Now readmitted for a/c CHF w/ marked fluid overload and ?low output. BNP >4,500. SCr 2.32 (baseline 1.6). Possible ACS, HS trop 1,562>>1,945>>4008. Echo this admit unchanged EF 25-30% + RWMA (LV global hypokinesis with mild dyskinesis of the left  ventricular apex and  disproportionately severe inferior/inferoseptal/inferolaeral hypokinesis), RV moderately reduced. He appeared volume overloaded yesterday and and got IV Lasix without much UOP overnight. PICC placed, co-ox mildly low at 57% and milrinone 0.25 mcg/kg/min begun with co-ox up to 65% yesterday (pending today).  C CVP 13-14 .  AKI is complicating effort to diurese him.  - No plans for coronary angiography at this point given advanced age and AKI, no chest pain.  - Continue Lasix 80 mg IV bid today, will not push him too hard with creatinine rising.  - Continue milrinone 0.25 mcg/kg/min for now.  - hold Jardiance w/ AKI - No Entresto/ spiro/ dig w/ AKI - Holding hydralazine/Imdur.  - not candidate for advanced  therapies given advanced age  26. CAD/ NSTEMI: Has super-dominant RCA. s/p CABG 1992. He had Taxus DES to mid PDA in 5/02. LHC (1/05) with 90% ostial PDA (had DES to this vessel), totally occluded RIMA-PDA, totally occluded PLV, sequential SVG-PLV with 1 branch occluded. LHC (11/15) with left main off the right cusp, 40-50% ostial left main, total occlusion of the proximal LAD (small vessel) with right to left collaterals, large super-dominant RCA with 50% ISR mid RCA, 50% ostial PDA, patent SVG-PLV, RIMA-PDA known atretic.EF dropped on recent echo 2/22 down to 25-30% however no plans for ischemic w/u given lack of CP and advanced age. Pt also declining invasive procedures. Now readmitted for a/c CHF + abdominal pain. HS trop 1,562>>1,945>>4008.  No chest pain. EKG showed NSR, 83 bpm, w/ nonspecific IVCD with LAD. Echo w/ RWMAs as outlined above. Possible NSTEMI with significant rise in HS-TnI.  - No plans for coronary angiography given advanced age and AKI, no chest pain.   - continue medical therapy w/ IV heparin + statin.  - no ? blocker w/ ?low output and h/o bradycardia  - ASA 81 4. AKI on Stage III CKD: baseline SCr ~1.6. 2.31 on admit, in setting of sepsis and ?low output + fluid overload.  Creatinine up to 3.4 today with CVP 14. BP has been stable.  - Continue milrinone 0.25 to optimize cardiac output.  -  He would not be a good HD candidate. 5. PAD: Severe bilateral SFA disease on 8/21 peripheral angiogram, no revascularization options. Leg ulcers now healed, no longer going to wound clinic. Minimal claudication.Cilostazol recently discontinued 2/22 given CHF. 6. Paroxysmal Atrial fibrillation/flutter:  Appears to be in AF this admission. Rate controlled. Off ? blocker due to bradycardia - Stop heparin drip and start eliquis.    - Continue home amiodarone 100 mg daily for now.  - Repeat ECG to confirm rhythm.  7. COPD: followed by pulmonology   8. 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 He has had resulting imbalance.  - He is no longer driving.  9. Hypothyroidism: continue levothyroxine  10. Pulmonary nodule: 1.3 cm RML nodule on 1/19 CT chest. It was also seen in 2014 =>slow growing. Dr Lake Bells has discussed with patient and wife and they have decided not to go forward with biopsy. They would not be interested in lung cancer treatment. CT chest in 7/20 showed stable 1 cm RML nodule.  11. Delirium: Suspect due to medical illness. Oriented today.   Length of Stay: 3  Amy Clegg, NP  04/16/2021, 8:42 AM  Advanced Heart Failure Team Pager [phone] (M-F; 7a - 5p)  Please contact [facility] for night-coverage after hours (5p -7a ) and weekends on [email or web address]  Patient seen with NP, agree with the above note.   Creatinine has risen again, up to 3.4 now.  Co-ox 63% on milrinone 0.25.  BP stable.  CVP 14, significant lower extremity and abdominal swelling and audible wheezes.  UOP only 525 cc, on Lasix 80 mg IV bid.   General: NAD Neck: JVP 14+ cm, no thyromegaly or thyroid nodule.  Lungs: Clear to auscultation bilaterally with normal respiratory effort. CV: Nondisplaced PMI.  Heart irregular S1/S2, no S3/S4, no murmur.  2+ edema to knees. Abdomen: Soft, nontender, no  hepatosplenomegaly, no distention.  Skin: Intact without lesions or rashes.  Neurologic: Alert, oriented to place Psych: Normal affect. Extremities: Leg lesions wrapped  HEENT: Normal.    Patient remains on ceftriaxone for cellulitis, afebrile but WBCs up to 19.  I remain worried about his trajectory.  Creatinine continues to worsen (3.4 today) with progressive volume overload and poor response to IV Lasix.  Weight rising.  - Continue milrinone, co-ox good at 63%.  - Lasix 80 mg IV x 1 today then Lasix gtt at 10 mg/hr.  I will give him a dose of metolazone today as well.  - He is not a HD candidate given age and cardiomyopathy.  I am worried that if he does not turn around with the current medical management, we will have few options available.  I talked to his wife today and have mentioned hospice as a possible option if renal function keeps worsening.  I will ask nephrology to see him given progressive renal dysfunction.  Palliative care for goals of care, would speak first to his wife as I think he still has some confusion.   Loralie Champagne 04/16/2021 10:24 AM

## 2021-04-16 NOTE — Consult Note (Addendum)
Consultation Note Date: 04/16/2021   Patient Name: Andrew [name]  DOB: February 18, 1928  MRN: [id number]  Age / Sex: 87 y.o., male  PCP: Andrew Barrack, MD Referring Physician: Little Ishikawa, MD  Reason for Consultation: Establishing goals of care "progressive renal dysfunction and intransigent CHF"  HPI/Patient Profile: 87 y.o. male  with past medical history of CKD stage IIIb, CAD s/p CABG, ischemic cardiomyopathy with primarily diastolic CHF, atrial fibrillation on eliquis, and PAD. He presented to the emergency department on 04/13/2021 with abdominal pain. His wife also reported worsening of his leg wounds.  CT of abdomen and pelvis revealing cholelithiasis without acute inflammation. Chest x-ray concerning for CHF. Found to have elevated troponin and BNP. Admitted to [facility] for sepsis secondary to BLE cellulitis, AKI on CKD, and acute on chronic combined HF.   Per cardiology, patient is not a candidate for  HD given age and cardiomyopathy.  Clinical Assessment and Goals of Care: I have reviewed medical records including EPIC notes, labs and imaging, and examined the patient at bedside. He states he is doing "good" and laughs. He remains on milrinone and lasix infusions.   I met at bedside with wife/Andrew [name]  to discuss diagnosis, prognosis, GOC, EOL wishes, disposition, and options.  I introduced Palliative Medicine as specialized medical care for people living with serious illness. It focuses on providing relief from the symptoms and stress of a serious illness.   We discussed a brief life review of the patient. He is originally from [place], [place]. He and Andrew [name] met at [employer] in [place]. 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 He and Andrew [name]  have been married for 63 years. They have 2 sons - the older son lives in [place] and the younger son lives in [place]. Andrew [name] is a man of faith and has been an active member of the [employer] for many years.   As far as functional and status, Andrew [name] states there has been a decline over the past year and shares that it has become increasingly difficult to care for him at home.    We discussed his current illness and what it means in the larger context of his ongoing co-morbidities.  Natural disease trajectory of advanced heart failure was discussed, emphasizing that it is a progressive and life-limiting illness that worsens over time. Discussed specific indicators that his heart failure is very advanced/end-stage, and that he is requiring continuous infusions of lasix and milrinone. Also discussed that his kidney function continues to worsen each day, and he would not be a candidate for dialysis.   The difference between aggressive medical intervention and comfort care was considered.  I introduced the concept of a comfort path to Andrew [name], emphasizing that it mean stopping full scope medical interventions with the goal of comfort rather than prolonging life. Introduced hospice philosophy and  provided information on home vs residential hospice services - answered all questions. Discussed that Andrew [name] should be eligible for residential hospice - she would prefer [facility] as it is closest to her home.  Andrew [name] shares that Andrew [name] has expressed "he is ready" for EOL. She expresses she would prefer it not to be a prolonged process.   Discussed transitioning to comfort care while in the hospital, and what that means--keeping him clean and dry, no labs, no artificial hydration or feeding, no antibiotics, minimizing of medications, comfort feeds, medication for pain and dyspnea. Explained that milrinone and lasix infusions would need to be stopped prior to transfer to residential  hospice.  Andrew [name] expresses concern about explaining all of this to her sons. I offer to speak with them together tomorrow by phone - we plan for 2 pm.   Primary decision maker: wife Andrew [name]    SUMMARY OF RECOMMENDATIONS    DNR/DNI as previously documented  Continue current medical care  No escalation of care  Tentative plan is to transition to comfort care over the weekend and transfer to [facility] when bed is available  Plan for follow-up discussion tomorrow at 2 pm with wife and sons (by phone)  Code Status/Advance Care Planning:  DNR  Palliative Prophylaxis:   Oral Care and Turn Reposition  Additional Recommendations (Limitations, Scope, Preferences):  No Hemodialysis  Psycho-social/Spiritual:   Created space and opportunity for family to express thoughts and feelings regarding patient's current medical situation.   Emotional support provided   Prognosis:   Very poor  Discharge Planning: To Be Determined      Primary Diagnoses: Present on Admission: . Acute on chronic systolic CHF (congestive heart failure) ([place])   I have reviewed the medical record, interviewed the patient and family, and examined the patient. The following aspects are pertinent.  Past Medical History:  Diagnosis Date  . AAA (abdominal aortic aneurysm) ([place])   . Anemia   . Arthritis   . CAD (coronary artery disease)   . Cancer ([place])    Melanoma - Back  . Carotid artery stenosis   . CHF (congestive heart failure) ([place])   . COPD (chronic obstructive pulmonary disease) ([place])   . Cyst of kidney, acquired   . Depression   . Diabetes mellitus   . DVT (deep venous thrombosis) ([place])   . Dysrhythmia   . Fatty liver 2008  . GERD (gastroesophageal reflux disease)   . Hyperlipidemia   . Hypertension   . IBS (irritable bowel syndrome)   . LVF (left ventricular failure) ([place])   . Paroxysmal atrial fibrillation ([facility])   . Pneumonia Jan. 2014  . Shortness of breath dyspnea    . Thyroid disease   . Typical atrial flutter ([place])   . UTI (urinary tract infection) 05/2015   While in [place]   . Vitamin D deficiency    Social History   Socioeconomic History  . Marital status: Married    Spouse name: Not on file  . Number of children: Not on file  . Years of education: Not on file  . Highest education [name]: Not on file  Occupational History  . Occupation: Retired  Tobacco Use  . Smoking status: Never Smoker  . Smokeless tobacco: Never Used  . Tobacco comment: quit atleast 25 yrs ago, per pt  Vaping Use  . Vaping Use: Never used  Substance and Sexual Activity  . Alcohol use: Never    Comment: socially  . Drug use: Never  .  Sexual activity: Not Currently  Other Topics Concern  . Not on file  Social History Narrative   ** Merged History Encounter **       Pt lives in [place] with spouse. Retired from [employer].  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    Family History  Problem Relation Age of Onset  . Colon cancer Father   . Coronary artery disease Brother   . Diabetes Brother   . Heart disease Brother   . Hyperlipidemia Brother   . Peripheral vascular disease Brother        Varicose Veins  . Throat cancer Brother        abdominal cancer?   . Diabetes Son   . Hyperlipidemia Son    Scheduled Meds: . (feeding supplement) PROSource Plus  30 mL Oral BID BM  . amiodarone  100 mg Oral Daily  . apixaban  2.5 mg Oral BID  . aspirin  81 mg Oral Daily  . atorvastatin  80 mg Oral QPM  . Chlorhexidine Gluconate Cloth  6 each Topical Daily  . ezetimibe  10 mg Oral Daily  . FLUoxetine  20 mg Oral Daily  . gabapentin  100 mg Oral TID  . levothyroxine  100 mcg Oral QAC breakfast  . multivitamin with minerals  1 tablet Oral Daily  . silver sulfADIAZINE   Topical Daily  . sodium chloride flush  10-40 mL Intracatheter Q12H   Continuous Infusions: . cefTRIAXone (ROCEPHIN)  IV 1 g (04/16/21 1632)  .  furosemide (LASIX) 200 mg in dextrose 5% 100 mL (39m/mL) infusion 10 mg/hr (04/16/21 1017)  . milrinone 0.25 mcg/kg/min (04/16/21 0337)   PRN Meds:.acetaminophen, nitroGLYCERIN, ondansetron (ZOFRAN) IV, sodium chloride flush Medications Prior to Admission:  Prior to Admission medications   Medication Sig Start Date End Date Taking? Authorizing Provider  acetaminophen (TYLENOL) 500 MG tablet Take 2 tablets (1,000 mg total) by mouth every 8 (eight) hours as needed. 06/01/20  Yes PVivi Barrack MD  amiodarone (PACERONE) 200 MG tablet Take 0.5 tablets (100 mg total) by mouth daily. 11/12/20  Yes MLarey Dresser MD  atorvastatin (LIPITOR) 80 MG tablet Take 1 tablet (80 mg total) by mouth daily. Patient taking differently: Take 80 mg by mouth every evening. 11/26/20  Yes MLarey Dresser MD  carvedilol (COREG) 6.25 MG tablet Take 6.25 mg by mouth 2 (two) times daily with a meal.   Yes [provider]  ELIQUIS 2.5 MG TABS tablet TAKE 1 TABLET BY MOUTH TWICE DAILY. Patient taking differently: Take 2.5 mg by mouth 2 (two) times daily. 01/12/21  Yes MLarey Dresser MD  empagliflozin (JARDIANCE) 10 MG TABS tablet Take 1 tablet (10 mg total) by mouth daily before breakfast. 01/14/21  Yes MLarey Dresser MD  ezetimibe (ZETIA) 10 MG tablet TAKE (1) TABLET DAILY. Patient taking differently: Take 10 mg by mouth daily. 03/17/21  Yes MLarey Dresser MD  FLUoxetine (PROZAC) 20 MG capsule TAKE (1) CAPSULE DAILY. Patient taking differently: Take 20 mg by mouth daily. 03/18/21  Yes PVivi Barrack MD  gabapentin (NEURONTIN) 100 MG capsule Take 100 mg by mouth 3 (three) times daily.   Yes [provider]  hydrALAZINE (APRESOLINE) 50 MG tablet Take 1 tablet (50 mg total) by mouth 3 (three) times daily. 11/26/20  Yes MLarey Dresser MD  isosorbide mononitrate (IMDUR) 30 MG 24 hr tablet TAKE 1 TABLET EACH DAY. Patient taking differently: Take 30 mg by  mouth daily. 04/06/21  Yes Larey Dresser,  MD  levothyroxine (SYNTHROID) 100 MCG tablet Take 1 tablet (100 mcg total) by mouth daily before breakfast. 04/12/21  Yes Larey Dresser, MD  Multiple Vitamins-Minerals (MULTIVITAMIN ADULTS 50+) TABS Take 1 tablet by mouth daily in the afternoon.   Yes [provider]  nitroGLYCERIN (NITROSTAT) 0.4 MG SL tablet DISSOLVE 1 TABLET UNDER TONGUE AS NEEDED FOR CHEST PAIN,MAY REPEAT IN5 MINUTES FOR 2 DOSES. Patient taking differently: Place 0.4 mg under the tongue every 5 (five) minutes as needed for chest pain. 07/30/19  Yes Larey Dresser, MD  pantoprazole (PROTONIX) 40 MG tablet Take 1 tablet (40 mg total) by mouth 2 (two) times daily. 06/12/20  Yes Charlynne Cousins, MD  potassium chloride SA (KLOR-CON) 20 MEQ tablet TAKE 1 TABLET ONCE DAILY. Patient taking differently: Take 20 mEq by mouth daily. 03/17/21  Yes Larey Dresser, MD  tiotropium (SPIRIVA) 18 MCG inhalation capsule Place 18 mcg into inhaler and inhale daily as needed (for wheezing).   Yes [provider]  torsemide (DEMADEX) 20 MG tablet Take 4 tablets (80 mg total) by mouth every morning AND 2 tablets (40 mg total) every evening. Patient taking differently: Take 4 tablets (80 mg total) by mouth every morning as of 3/15. 01/14/21  Yes Larey Dresser, MD  metolazone (ZAROXOLYN) 2.5 MG tablet Take 1 tablet (2.5 mg total) by mouth as directed. By [facility] Clinic Patient not taking: No sig reported 08/20/20   Larey Dresser, MD   Allergies  Allergen Reactions  . Metoprolol Shortness Of Breath, Palpitations and Other (See Comments)    Heart starts racing. Shallow breathing; pt states he also get skin irritation on his legs  . Metformin Hives and Swelling    On legs  . Metformin And Related Nausea And Vomiting    Physical Exam Vitals reviewed.  Constitutional:      Appearance: He is ill-appearing.  HENT:     Ears:     Comments: Hard of hearing Cardiovascular:     Rate and Rhythm: Normal rate.  Pulmonary:      Breath sounds: Wheezing present.  Neurological:     Mental Status: He is alert. He is confused.     Motor: Weakness present.     Vital Signs: BP 126/60 (BP Location: Left Arm)   Pulse 71   Temp 97.9 F (36.6 C) (Oral)   Resp 19   Ht 5' 7"  (1.702 m)   Wt 84.1 kg   SpO2 96%   BMI 29.04 kg/m  Pain Scale: 0-10   Pain Score: Asleep   SpO2: SpO2: 96 % O2 Device:SpO2: 96 % O2 Flow Rate: .O2 Flow Rate (L/min): 3 L/min  IO: Intake/output summary:   Intake/Output Summary (Last 24 hours) at 04/16/2021 1739 Last data filed at 04/16/2021 1337 Gross per 24 hour  Intake 1656.26 ml  Output 895 ml  Net 761.26 ml    LBM: Last BM Date: 04/13/21 Baseline Weight: Weight: 77.1 kg Most recent weight: Weight: 84.1 kg      Palliative Assessment/Data: PPS 20%     Time In: 1630 Time Out: 1742 Time Total: 72 minutes Greater than 50%  of this time was spent counseling and coordinating care related to the above assessment and plan.  Signed by: Lavena Bullion, NP   Please contact Palliative Medicine Team phone at [phone] for questions and concerns.  For individual provider: See Shea Evans

## 2021-04-17 DIAGNOSIS — N179 Acute kidney failure, unspecified: Secondary | ICD-10-CM | POA: Diagnosis not present

## 2021-04-17 DIAGNOSIS — N184 Chronic kidney disease, stage 4 (severe): Secondary | ICD-10-CM | POA: Diagnosis not present

## 2021-04-17 DIAGNOSIS — Z515 Encounter for palliative care: Secondary | ICD-10-CM | POA: Diagnosis not present

## 2021-04-17 DIAGNOSIS — I5023 Acute on chronic systolic (congestive) heart failure: Secondary | ICD-10-CM | POA: Diagnosis not present

## 2021-04-17 LAB — BASIC METABOLIC PANEL
Anion gap: 10 (ref 5–15)
BUN: 106 mg/dL — ABNORMAL HIGH (ref 8–23)
CO2: 22 mmol/L (ref 22–32)
Calcium: 8.2 mg/dL — ABNORMAL LOW (ref 8.9–10.3)
Chloride: 98 mmol/L (ref 98–111)
Creatinine, Ser: 3.42 mg/dL — ABNORMAL HIGH (ref 0.61–1.24)
GFR, Estimated: 16 mL/min — ABNORMAL LOW (ref >60)
Glucose, Bld: 139 mg/dL — ABNORMAL HIGH (ref 70–99)
Potassium: 4.3 mmol/L (ref 3.5–5.1)
Sodium: 130 mmol/L — ABNORMAL LOW (ref 135–145)

## 2021-04-17 LAB — CBC
HCT: 29.8 % — ABNORMAL LOW (ref 39.0–52.0)
Hemoglobin: 9.4 g/dL — ABNORMAL LOW (ref 13.0–17.0)
MCH: 29.3 pg (ref 26.0–34.0)
MCHC: 31.5 g/dL (ref 30.0–36.0)
MCV: 92.8 fL (ref 80.0–100.0)
Platelets: 158 10*3/uL (ref 150–400)
RBC: 3.21 MIL/uL — ABNORMAL LOW (ref 4.22–5.81)
RDW: 14.8 % (ref 11.5–15.5)
WBC: 15.4 10*3/uL — ABNORMAL HIGH (ref 4.0–10.5)
nRBC: 0.7 % — ABNORMAL HIGH (ref 0.0–0.2)

## 2021-04-17 LAB — COOXEMETRY PANEL
Carboxyhemoglobin: 1.3 % (ref 0.5–1.5)
Methemoglobin: 1 % (ref 0.0–1.5)
O2 Saturation: 57.8 %
Total hemoglobin: 9.5 g/dL — ABNORMAL LOW (ref 12.0–16.0)

## 2021-04-17 LAB — GLUCOSE, CAPILLARY
Glucose-Capillary: 142 mg/dL — ABNORMAL HIGH (ref 70–99)
Glucose-Capillary: 146 mg/dL — ABNORMAL HIGH (ref 70–99)
Glucose-Capillary: 191 mg/dL — ABNORMAL HIGH (ref 70–99)

## 2021-04-17 LAB — MAGNESIUM: Magnesium: 2.5 mg/dL — ABNORMAL HIGH (ref 1.7–2.4)

## 2021-04-17 MED ORDER — FUROSEMIDE 10 MG/ML IJ SOLN
160.0000 mg | Freq: Two times a day (BID) | INTRAVENOUS | Status: DC
  Administered 2021-04-18 – 2021-04-19 (×4): 160 mg via INTRAVENOUS
  Filled 2021-04-17 (×2): qty 16
  Filled 2021-04-17: qty 2
  Filled 2021-04-17 (×3): qty 16

## 2021-04-17 MED ORDER — POLYVINYL ALCOHOL 1.4 % OP SOLN
1.0000 [drp] | Freq: Four times a day (QID) | OPHTHALMIC | Status: DC | PRN
  Filled 2021-04-17: qty 15

## 2021-04-17 MED ORDER — FUROSEMIDE 10 MG/ML IJ SOLN
160.0000 mg | Freq: Two times a day (BID) | INTRAVENOUS | Status: DC
  Administered 2021-04-17: 160 mg via INTRAVENOUS
  Filled 2021-04-17 (×3): qty 16

## 2021-04-17 MED ORDER — BIOTENE DRY MOUTH MT LIQD: 15.0000 mL | OROMUCOSAL | Status: DC | PRN

## 2021-04-17 MED ORDER — LORAZEPAM 2 MG/ML PO CONC: 1.0000 mg | ORAL | Status: DC | PRN

## 2021-04-17 MED ORDER — LORAZEPAM 2 MG/ML IJ SOLN: 1.0000 mg | INTRAMUSCULAR | Status: DC | PRN

## 2021-04-17 MED ORDER — FENTANYL BOLUS VIA INFUSION
20.0000 ug | INTRAVENOUS | Status: DC | PRN
  Administered 2021-04-17 – 2021-04-19 (×3): 20 ug via INTRAVENOUS
  Filled 2021-04-17: qty 20

## 2021-04-17 MED ORDER — LORAZEPAM 1 MG PO TABS: 1.0000 mg | ORAL_TABLET | ORAL | Status: DC | PRN

## 2021-04-17 MED ORDER — GLYCOPYRROLATE 0.2 MG/ML IJ SOLN
0.4000 mg | INTRAMUSCULAR | Status: DC | PRN
  Administered 2021-04-18 – 2021-04-19 (×3): 0.4 mg via INTRAVENOUS
  Filled 2021-04-17 (×3): qty 2

## 2021-04-17 MED ORDER — GLYCOPYRROLATE 0.2 MG/ML IJ SOLN
0.2000 mg | INTRAMUSCULAR | Status: DC | PRN
  Filled 2021-04-17: qty 1

## 2021-04-17 MED ORDER — FENTANYL 2500MCG IN NS 250ML (10MCG/ML) PREMIX INFUSION
20.0000 ug/h | INTRAVENOUS | Status: DC
  Administered 2021-04-17: 20 ug/h via INTRAVENOUS
  Filled 2021-04-17: qty 250

## 2021-04-17 NOTE — Progress Notes (Signed)
Patient is resting comfortably without any signs of distress. Fentanyl drip is infusing for comfort. Will continue to monitor for patient's comfort.

## 2021-04-17 NOTE — Progress Notes (Signed)
[facility] ROUNDING NOTE   Subjective:   Interval History: This is a 87 year old gent with a history of chronic kidney disease stage IV, congestive heart failure, COPD diabetes mellitus, hypertension hyperlipidemia presents to the emergency room 04/13/2021 with abdominal pain.  Possible NSTEMI and bilateral lower extremity cellulitis.  His baseline serum creatinine is about 2 mg/dL.  He was admitted with serum creatinine increased about 3.4 mg/dL.  His renal ultrasound did not show any evidence of hydronephrosis.  He is admitted with some volume overload and we are increasing his Lasix 160 mg every 12 hours to improve diuresis.  His last TSH was 1.94 01/14/2021.  He is not a candidate for dialysis in my opinion.   Blood pressure 114/62 pulse 86 temperature 97.7 O2 sats 96% 3 L nasal cannula.  Urine output 695 cc 04/16/2021   Sodium 130 potassium 4.3 chloride 98 CO2 22 BUN 106 creatinine 3.42 glucose 139 calcium 8.2 hemoglobin 9.4.  Medications: Amiodarone 100 mg daily, Eliquis 2.5 mg twice daily, aspirin 81 mg daily, atorvastatin 80 mg daily, Zetia 10 mg daily, Prozac 20 mg daily, Neurontin 100 mg 3 times daily, Synthroid 100 mcg daily, multivitamins 1 daily.  IV Lasix drip 10 mg/h IV milrinone IV ceftriaxone     Objective:  Vital signs in last 24 hours:  Temp:  [97.7 F (36.5 C)-98.2 F (36.8 C)] 97.7 F (36.5 C) (04/23 0327) Pulse Rate:  [62-71] 65 (04/23 0800) Resp:  [16-19] 16 (04/23 0753) BP: (104-130)/(60-65) 114/62 (04/23 0800) SpO2:  [91 %-96 %] 91 % (04/23 0800) Weight:  [84.4 kg] 84.4 kg (04/23 0327)  Weight change: 0.3 kg Filed Weights   04/15/21 0400 04/16/21 0500 04/17/21 0327  Weight: 81.2 kg 84.1 kg 84.4 kg    Intake/Output: I/O last 3 completed shifts: In: 2118.3 [P.O.:1620; I.V.:298.3; IV Piggyback:200] Out: 1070 [Urine:1070]   Intake/Output this shift:  Total I/O In: 240 [P.O.:240] Out: -   CVS- RRR no murmurs rubs gallops RS- CTA  significant expiratory wheezes 3 L nasal cannula ABD- BS present soft non-distended EXT-2-3+ lower extremity edema   Basic Metabolic Panel: Recent Labs  Lab 04/13/21 2332 04/14/21 0455 04/15/21 0500 04/16/21 0348 04/17/21 0318  NA 137 139 136 132* 130*  K 4.3 4.3 4.3 4.1 4.3  CL 104 104 103 99 98  CO2 22 22 24 23 22   GLUCOSE 94 98 142* 163* 139*  BUN 74* 81* 89* 98* 106*  CREATININE 2.82* 2.91* 3.17* 3.39* 3.42*  CALCIUM 8.4* 8.5* 8.3* 8.0* 8.2*  MG  --   --   --   --  2.5*    Liver Function Tests: Recent Labs  Lab 04/13/21 0124 04/14/21 0455  AST 40 101*  ALT 33 76*  ALKPHOS 92 77  BILITOT 1.1 1.1  PROT 6.8 5.8*  ALBUMIN 3.3* 2.7*   Recent Labs  Lab 04/13/21 0124  LIPASE 58*   No results for input(s): AMMONIA in the last 168 hours.  CBC: Recent Labs  Lab 04/13/21 0124 04/14/21 0455 04/15/21 0546 04/16/21 0348 04/17/21 0318  WBC 14.6* 16.7* 18.9* 19.5* 15.4*  NEUTROABS 12.5* 14.1*  --   --   --   HGB 11.4* 10.2* 9.6* 9.1* 9.4*  HCT 36.6* 33.0* 31.2* 29.0* 29.8*  MCV 95.1 95.7 94.8 93.5 92.8  PLT 203 170 173 156 158    Cardiac Enzymes: No results for input(s): CKTOTAL, CKMB, CKMBINDEX, TROPONINI in the last 168 hours.  BNP: Invalid input(s): POCBNP  CBG: Recent Labs  Lab 04/16/21 1216 04/16/21 1648 04/16/21 2046 04/17/21 0600 04/17/21 0736  GLUCAP 183* 171* 176* 142* 146*    Microbiology: Results for orders placed or performed during the hospital encounter of 04/13/21  Blood culture (routine x 2)     Status: None (Preliminary result)   Collection Time: 04/13/21 10:41 AM   Specimen: Left Antecubital; Blood  Result Value Ref Range Status   Specimen Description   Final    LEFT ANTECUBITAL Performed at [facility], [place] [address] [place], [place] [address]    Special Requests   Final    BOTTLES DRAWN AEROBIC AND ANAEROBIC Blood Culture adequate volume Performed at [place] [address] [place], [place] [address]    Culture   Final    NO GROWTH 4 DAYS Performed at [facility], [place] [address], [place], [place] [address]    Report Status PENDING  Incomplete  Blood culture (routine x 2)     Status: None (Preliminary result)   Collection Time: 04/13/21 10:51 AM   Specimen: Right Antecubital; Blood  Result Value Ref Range Status   Specimen Description   Final    RIGHT ANTECUBITAL Performed at [place] [address], [place], [place] [address]    Special Requests   Final    BOTTLES DRAWN AEROBIC AND ANAEROBIC Blood Culture adequate volume Performed at [place] [address], [place], [place] [address]    Culture   Final    NO GROWTH 4 DAYS Performed at [facility], [place] [address], [place], [place] [address]    Report Status PENDING  Incomplete  Resp Panel by RT-PCR (Flu A&B, Covid) Nasopharyngeal Swab     Status: None   Collection Time: 04/13/21 10:59 AM   Specimen: Nasopharyngeal Swab; Nasopharyngeal(NP) swabs in vial transport medium  Result Value Ref Range Status   SARS Coronavirus 2 by RT PCR NEGATIVE NEGATIVE Final    Comment: (NOTE) SARS-CoV-2 target nucleic acids are NOT DETECTED.  The SARS-CoV-2 RNA is generally detectable in upper respiratory specimens during the acute phase of infection. The lowest concentration of SARS-CoV-2 viral copies this assay can detect is 138 copies/mL. A negative result does not preclude SARS-Cov-2 infection and should not be used as the sole basis for treatment or other patient management decisions. A negative result may occur with  improper specimen collection/handling, submission of specimen other than nasopharyngeal swab, presence of viral mutation(s) within the areas targeted by this assay, and inadequate number of viral copies(<138 copies/mL). A negative result must be combined with clinical observations, patient history, and epidemiological information. The  expected result is Negative.  Fact Sheet for Patients:  EntrepreneurPulse.com.au  Fact Sheet for Healthcare Providers:  IncredibleEmployment.be  This test is no t yet approved or cleared by the [place] FDA and  has been authorized for detection and/or diagnosis of SARS-CoV-2 by FDA under an Emergency Use Authorization (EUA). This EUA will remain  in effect (meaning this test can be used) for the duration of the COVID-19 declaration under Section 564(b)(1) of the Act, 21 U.S.C.section 360bbb-3(b)(1), unless the authorization is terminated  or revoked sooner.       Influenza A by PCR NEGATIVE NEGATIVE Final   Influenza B by PCR NEGATIVE NEGATIVE Final    Comment: (NOTE) The Xpert Xpress SARS-CoV-2/FLU/RSV plus assay is intended as an aid in the diagnosis of influenza from Nasopharyngeal swab specimens and should not be used as  a sole basis for treatment. Nasal washings and aspirates are unacceptable for Xpert Xpress SARS-CoV-2/FLU/RSV testing.  Fact Sheet for Patients: EntrepreneurPulse.com.au  Fact Sheet for Healthcare Providers: IncredibleEmployment.be  This test is not yet approved or cleared by the [place] FDA and has been authorized for detection and/or diagnosis of SARS-CoV-2 by FDA under an Emergency Use Authorization (EUA). This EUA will remain in effect (meaning this test can be used) for the duration of the COVID-19 declaration under Section 564(b)(1) of the Act, 21 U.S.C. section 360bbb-3(b)(1), unless the authorization is terminated or revoked.  Performed at [facility], [place] [address], [place], [place] [address]    *Note: Due to a large number of results and/or encounters for the requested time period, some results have not been displayed. A complete set of results can be found in Results Review.    Coagulation Studies: No results for input(s): LABPROT, INR  in the last 72 hours.  Urinalysis: No results for input(s): COLORURINE, LABSPEC, PHURINE, GLUCOSEU, HGBUR, BILIRUBINUR, KETONESUR, PROTEINUR, UROBILINOGEN, NITRITE, LEUKOCYTESUR in the last 72 hours.  Invalid input(s): APPERANCEUR    Imaging: US RENAL  Result Date: 04/15/2021 CLINICAL DATA:  Urinary retention EXAM: RENAL / URINARY TRACT ULTRASOUND COMPLETE COMPARISON:  04/13/2021 FINDINGS: Right Kidney: Renal measurements: 13.8 x 7.5 x 5.4 cm. = volume: 296 mL. Increased echogenicity is noted. Multiple cysts are noted the largest of which measures 4.5 cm in the lower pole of the right kidney. These changes are similar to that seen on the prior CT examination. No mass lesion or hydronephrosis is noted. Left Kidney: Renal measurements: 13.4 x 6.6 x 6.5 cm. = volume: 302 mL. Mild increased echogenicity is noted. Cysts are noted within the left kidney also similar to that seen on the prior CT examination. The largest of these measures 6.9 cm in greatest dimension. No obstructive changes are seen. Bladder: Partially distended. Other: None. IMPRESSION: Bilateral renal cysts similar to that seen on the prior CT examination. Mild increased echogenicity consistent with medical renal disease. No obstructive changes are noted. Electronically Signed   By: Inez Catalina M.D.   On: 04/15/2021 19:19   DG Chest Port 1 View  Result Date: 04/16/2021 CLINICAL DATA:  Hypoxia. EXAM: PORTABLE CHEST 1 VIEW COMPARISON:  One-view chest x-ray 04/13/2021 FINDINGS: Heart is enlarged. Atherosclerotic calcifications are present at the aortic arch. Heart size exaggerated by low lung volumes. Left greater than right pleural effusions are present. Bibasilar airspace disease is again noted. Mild pulmonary vascular congestion is stable. Right-sided PICC line is stable. The tip is at the cavoatrial junction. IMPRESSION: 1. Stable cardiomegaly and mild pulmonary vascular congestion. 2. Left greater than right pleural effusions and  bibasilar airspace disease. While this likely reflects atelectasis, infection is not excluded. 3. Stable right-sided PICC line terminates at the cavoatrial junction. Electronically Signed   By: San Morelle M.D.   On: 04/16/2021 16:25     Medications:   . cefTRIAXone (ROCEPHIN)  IV Stopped (04/16/21 1713)  . furosemide (LASIX) 200 mg in dextrose 5% 100 mL (2mg /mL) infusion 10 mg/hr (04/17/21 0631)  . milrinone 0.25 mcg/kg/min (04/17/21 0631)   . (feeding supplement) PROSource Plus  30 mL Oral BID BM  . amiodarone  100 mg Oral Daily  . apixaban  2.5 mg Oral BID  . aspirin  81 mg Oral Daily  . atorvastatin  80 mg Oral QPM  . Chlorhexidine Gluconate Cloth  6 each Topical Daily  . ezetimibe  10 mg Oral Daily  .  FLUoxetine  20 mg Oral Daily  . gabapentin  100 mg Oral TID  . levothyroxine  100 mcg Oral QAC breakfast  . multivitamin with minerals  1 tablet Oral Daily  . silver sulfADIAZINE   Topical Daily  . sodium chloride flush  10-40 mL Intracatheter Q12H   acetaminophen, nitroGLYCERIN, ondansetron (ZOFRAN) IV, sodium chloride flush  Assessment/ Plan:   Acute kidney injury in setting of stage IV chronic kidney disease baseline serum creatinine about 3 mg/dL.  Now admitted with volume overload that is responding to Lasix drip.  Unfortunately renal function does appear to be deteriorating.  In my opinion I do not believe that [name] is a candidate for long-term outpatient hemodialysis.  ANEMIA-check iron studies  MBD-we will check PTH  HTN/VOL-we will continue on IV Lasix drip  Hypothyroidism continue replacement therapy  Coronary artery disease continue atorvastatin aspirin  Atrial fibrillation anticoagulated and amiodarone for rate control     LOS: 4 Sherril Croon @TODAY @9 :27 AM

## 2021-04-17 NOTE — Progress Notes (Signed)
Daily Progress Note   Patient Name: Andrew [name]       Date: 04/17/2021 DOB: 01-06-1928  Age: 87 y.o. MRN#: [id number] Attending Physician: Little Ishikawa, MD Primary Care Physician: [name], MD Admit Date: 04/13/2021  Reason for Consultation/Follow-up: Establishing goals of care "progressive renal dysfunction and intransigent CHF"  Subjective: I met with wife Vinnie Level again today at bedside. We included son Dellis Filbert, son Legrand Como, and daughter-in-law Lattie Haw by phone.  I reviewed Palliative Medicine as specialized medical care for people living with serious illness. It focuses on providing relief from the symptoms and stress of a serious illness.   We discussed his current illness and what it means in the larger context of his ongoing co-morbidities.  Natural disease trajectory of advanced heart failure was discussed, emphasizing that it is a progressive and life-limiting illness that worsens over time. Discussed specific indicators that his heart failure is very advanced/end-stage, and that he is requiring high-dose IV lasix and continuous infusion of milrinone. Also discussed that his kidney function continues to worsen each day, and he would not be a candidate for dialysis.   The difference between full scope medical intervention and comfort care was considered. I reviewed the concept of a comfort path with family, emphasizing that it involves stopping full scope medical interventions with the goal of comfort rather than prolonging life.  Family shares that Kipp has expressed "he is ready" for EOL over the past year. They prefer that it not to be a prolonged process for him.    Discussed transitioning to comfort care while in the hospital, and what that means--keeping him clean  and dry, no labs, no artificial hydration or feeding, no antibiotics, minimizing of medications, comfort feeds, medication for pain and dyspnea. Discussed starting scheduled opioids for dyspnea and utilizing a continuous opioid infusion if needed. Explained that milrinone infusion would be stopped once we ensured he was comfortable.   Discussed that [place] is more somnolent/encephalopathic today compared to yesterday. I prepared them that once milrinone is stopped, prognosis is likely hours-days.    Length of Stay: 4  Current Medications: Scheduled Meds:  . (feeding supplement) PROSource Plus  30 mL Oral BID BM  . amiodarone  100 mg Oral Daily  . apixaban  2.5 mg Oral BID  . aspirin  81 mg  Oral Daily  . atorvastatin  80 mg Oral QPM  . Chlorhexidine Gluconate Cloth  6 each Topical Daily  . ezetimibe  10 mg Oral Daily  . FLUoxetine  20 mg Oral Daily  . gabapentin  100 mg Oral TID  . levothyroxine  100 mcg Oral QAC breakfast  . multivitamin with minerals  1 tablet Oral Daily  . silver sulfADIAZINE   Topical Daily  . sodium chloride flush  10-40 mL Intracatheter Q12H    Continuous Infusions: . cefTRIAXone (ROCEPHIN)  IV Stopped (04/16/21 1713)  . furosemide 160 mg (04/17/21 1243)  . milrinone 0.25 mcg/kg/min (04/17/21 0631)    PRN Meds: acetaminophen, nitroGLYCERIN, ondansetron (ZOFRAN) IV, sodium chloride flush  Physical Exam Vitals reviewed.  Constitutional:      General: He is not in acute distress.    Appearance: He is ill-appearing.     Comments: somnolent  Cardiovascular:     Rate and Rhythm: Normal rate. Rhythm irregularly irregular.     Comments: A-fib Pulmonary:     Breath sounds: Wheezing present.             Vital Signs: BP 126/68 (BP Location: Left Arm)   Pulse 65   Temp 97.7 F (36.5 C) (Oral)   Resp 18   Ht 5' 7"  (1.702 m)   Wt 84.4 kg   SpO2 95%   BMI 29.14 kg/m  SpO2: SpO2: 95 % O2 Device: O2 Device: Nasal Cannula O2 Flow Rate: O2 Flow Rate  (L/min): 3 L/min  Intake/output summary:   Intake/Output Summary (Last 24 hours) at 04/17/2021 1502 Last data filed at 04/17/2021 1045 Gross per 24 hour  Intake 1313.11 ml  Output 625 ml  Net 688.11 ml   LBM: Last BM Date: 04/17/21 Baseline Weight: Weight: 77.1 kg Most recent weight: Weight: 84.4 kg       Palliative Assessment/Data: PPS 20%      Palliative Care Assessment & Plan   HPI/Patient Profile: 87 y.o. male  with past medical history of CKD stage IIIb, CAD s/p CABG, ischemic cardiomyopathy with primarily diastolic CHF, atrial fibrillation on eliquis, and PAD. He presented to the emergency department on 04/13/2021 with abdominal pain. His wife also reported worsening of his leg wounds.  CT of abdomen and pelvis revealing cholelithiasis without acute inflammation. Chest x-ray concerning for CHF. Found to have elevated troponin and BNP. Admitted to [facility] for sepsis secondary to BLE cellulitis, AKI on CKD, and acute on chronic combined HF.   Per cardiology, patient is not a candidate for  HD given age and cardiomyopathy.  Assessment: - acute on chronic systolic CHF - sepsis secondary to cellulitis of BLE - AKI on CKD 3b - elevated troponin - acute on chronic hypoxic respiratory failure - acute metabolic encephalopathy - paroxysmal a fib  Recommendations/Plan: Full comfort measures initiated DNR/DNI as previously documented Added orders for symptom management at EOL as well as discontinued orders that were not focused on comfort Start fentanyl infusion and administer bolus dose prior to stopping milrinone Continue IV lasix for now - will defer to cardiology Unrestricted visitation orders were placed per current visitation policy  Provide frequent assessments and administer PRN medications as clinically necessary to ensure EOL comfort PMT will continue to follow holistically   Goals of Care and Additional Recommendations: Limitations on Scope of Treatment: Full Comfort  Care  Code Status: DNR/DNI  Prognosis:  Hours - Days  Discharge Planning: Anticipated Hospital Death  Care plan was discussed with Dr. Avon Gully, bedside  RN  Thank you for allowing the Palliative Medicine Team to assist in the care of this patient.   Total Time 73 minutes Prolonged Time Billed  yes       Greater than 50%  of this time was spent counseling and coordinating care related to the above assessment and plan.  Lavena Bullion, NP  Please contact Palliative Medicine Team phone at [phone] for questions and concerns.

## 2021-04-17 NOTE — Progress Notes (Signed)
Patient ID: Andrew [name], male   DOB: 08-26-1928, 87 y.o.   MRN: [id number]     Advanced Heart Failure Rounding Note  PCP-Cardiologist: Loralie Champagne, MD   Subjective:    Remains on milrinone 0.25 mcg + IV lasix. Co-ox 58%.   Creatinine 2.8 => 2.91 => 3.17 => 3.4 => 3.42, BUN increasing. 845 cc UOP yesterday. CVP up to 19.   Afebrile, WBCs 19.5 => 15.    Remains in atrial fibrillation.   Drowsy but will awaken and answer questions.    Objective:   Weight Range: 84.4 kg Body mass index is 29.14 kg/m.   Vital Signs:   Temp:  [97.7 F (36.5 C)-98.2 F (36.8 C)] 97.7 F (36.5 C) (04/23 0327) Pulse Rate:  [62-71] 65 (04/23 0800) Resp:  [16-19] 16 (04/23 0753) BP: (104-130)/(60-65) 114/62 (04/23 0800) SpO2:  [91 %-96 %] 91 % (04/23 0800) Weight:  [84.4 kg] 84.4 kg (04/23 0327) Last BM Date: 04/17/21  Weight change: Filed Weights   04/15/21 0400 04/16/21 0500 04/17/21 0327  Weight: 81.2 kg 84.1 kg 84.4 kg    Intake/Output:   Intake/Output Summary (Last 24 hours) at 04/17/2021 0939 Last data filed at 04/17/2021 0907 Gross per 24 hour  Intake 1613.11 ml  Output 625 ml  Net 988.11 ml      Physical Exam   CVP 19 General: NAD Neck: JVP 16+, no thyromegaly or thyroid nodule.  Lungs: End expiratory wheezes CV: Nondisplaced PMI.  Heart irregular S1/S2, no S3/S4, no murmur.  1+ edema 1/2 to knees. Abdomen: Soft, nontender, no hepatosplenomegaly, no distention.  Skin: Intact without lesions or rashes.  Neurologic: Alert and oriented x 3.  Psych: Normal affect. Extremities: Lower legs wrapped  HEENT: Normal.    Telemetry    A fib 60s    Labs    CBC Recent Labs    04/16/21 0348 04/17/21 0318  WBC 19.5* 15.4*  HGB 9.1* 9.4*  HCT 29.0* 29.8*  MCV 93.5 92.8  PLT 156 213   Basic Metabolic Panel Recent Labs    04/16/21 0348 04/17/21 0318  NA 132* 130*  K 4.1 4.3  CL 99 98  CO2 23 22  GLUCOSE 163* 139*  BUN 98* 106*  CREATININE 3.39* 3.42*   CALCIUM 8.0* 8.2*  MG  --  2.5*   Liver Function Tests No results for input(s): AST, ALT, ALKPHOS, BILITOT, PROT, ALBUMIN in the last 72 hours. No results for input(s): LIPASE, AMYLASE in the last 72 hours. Cardiac Enzymes No results for input(s): CKTOTAL, CKMB, CKMBINDEX, TROPONINI in the last 72 hours.  BNP: BNP (last 3 results) Recent Labs    06/03/20 0604 02/20/21 0313 04/13/21 1051  BNP 1,981.3* 1,833.3* >4,500.0*    ProBNP (last 3 results) No results for input(s): PROBNP in the last 8760 hours.   D-Dimer No results for input(s): DDIMER in the last 72 hours. Hemoglobin A1C No results for input(s): HGBA1C in the last 72 hours. Fasting Lipid Panel No results for input(s): CHOL, HDL, LDLCALC, TRIG, CHOLHDL, LDLDIRECT in the last 72 hours. Thyroid Function Tests No results for input(s): TSH, T4TOTAL, T3FREE, THYROIDAB in the last 72 hours.  Invalid input(s): FREET3  Other results:   Imaging    DG Chest Port 1 View  Result Date: 04/16/2021 CLINICAL DATA:  Hypoxia. EXAM: PORTABLE CHEST 1 VIEW COMPARISON:  One-view chest x-ray 04/13/2021 FINDINGS: Heart is enlarged. Atherosclerotic calcifications are present at the aortic arch. Heart size exaggerated by low lung volumes. Left  greater than right pleural effusions are present. Bibasilar airspace disease is again noted. Mild pulmonary vascular congestion is stable. Right-sided PICC line is stable. The tip is at the cavoatrial junction. IMPRESSION: 1. Stable cardiomegaly and mild pulmonary vascular congestion. 2. Left greater than right pleural effusions and bibasilar airspace disease. While this likely reflects atelectasis, infection is not excluded. 3. Stable right-sided PICC line terminates at the cavoatrial junction. Electronically Signed   By: San Morelle M.D.   On: 04/16/2021 16:25     Medications:     Scheduled Medications: . (feeding supplement) PROSource Plus  30 mL Oral BID BM  . amiodarone  100 mg  Oral Daily  . apixaban  2.5 mg Oral BID  . aspirin  81 mg Oral Daily  . atorvastatin  80 mg Oral QPM  . Chlorhexidine Gluconate Cloth  6 each Topical Daily  . ezetimibe  10 mg Oral Daily  . FLUoxetine  20 mg Oral Daily  . gabapentin  100 mg Oral TID  . levothyroxine  100 mcg Oral QAC breakfast  . multivitamin with minerals  1 tablet Oral Daily  . silver sulfADIAZINE   Topical Daily  . sodium chloride flush  10-40 mL Intracatheter Q12H    Infusions: . cefTRIAXone (ROCEPHIN)  IV Stopped (04/16/21 1713)  . furosemide    . milrinone 0.25 mcg/kg/min (04/17/21 0631)    PRN Medications: acetaminophen, nitroGLYCERIN, ondansetron (ZOFRAN) IV, sodium chloride flush     Assessment/Plan   1. ID: Possible sepsis syndrome from lower extremity cellulitis, has not been compliant with wound clinic.  Plain films of LEs negative for osteomyelitis.  PCT 3.61 but in setting of AKI. WBCs 15.  - abx per [facility], on ceftriaxone   - Blood cultures NGTD.  - Hydralazine/Imdur on hold with soft BP.  2. Acute on Chronic Systolic CHF: Ischemic cardiomyopathy: EF 40-45% on 3/18 echo. Echo 8/20 with EF 45-50%, normal RV. Echo in 9/21 with EF 40-45%, mild mitral stenosis.Recent admit 2/22 for a/c CHF. Repeat echo EF lower, 25-30%, RV ok. Cath declined due to age and lack of ischemic CP. Now readmitted for a/c CHF w/ marked fluid overload and ?low output. BNP >4,500. SCr 2.32 (baseline 1.6). Possible ACS, HS trop 1,562>>1,945>>4008. Echo this admit unchanged EF 25-30% + RWMA (LV global hypokinesis with mild dyskinesis of the left  ventricular apex and disproportionately severe inferior/inferoseptal/inferolaeral hypokinesis), RV moderately reduced.  He is volume overloaded in setting of AKI on CKD stage 3, CVP 19 today with rising BUN and creatinine.  He remains on milrinone 0.25 mcg/kg/min with co-ox 58%.  - No plans for coronary angiography at this point given advanced age and AKI, no chest pain.  - Continue  milrinone 0.25 mcg/kg/min for now.  - Will stop Lasix gtt and try to diurese with Lasix 160 mg IV bid.  He is not a dialysis candidate with age and severe cardiomyopathy.  - hold Jardiance w/ AKI - No Entresto/ spiro/ dig w/ AKI - Holding hydralazine/Imdur.  - not candidate for advanced therapies given advanced age  27. CAD/ NSTEMI: Has super-dominant RCA. s/p CABG 1992. He had Taxus DES to mid PDA in 5/02. LHC (1/05) with 90% ostial PDA (had DES to this vessel), totally occluded RIMA-PDA, totally occluded PLV, sequential SVG-PLV with 1 branch occluded. LHC (11/15) with left main off the right cusp, 40-50% ostial left main, total occlusion of the proximal LAD (small vessel) with right to left collaterals, large super-dominant RCA with 50% ISR mid RCA,  50% ostial PDA, patent SVG-PLV, RIMA-PDA known atretic.EF dropped on recent echo 2/22 down to 25-30% however no plans for ischemic w/u given lack of CP and advanced age. Pt also declining invasive procedures. Now readmitted for a/c CHF + abdominal pain. HS trop 1,562>>1,945>>4008.  No chest pain. EKG showed NSR, 83 bpm, w/ nonspecific IVCD with LAD. Echo w/ RWMAs as outlined above. Possible NSTEMI with significant rise in HS-TnI.  - No plans for coronary angiography given advanced age and AKI, no chest pain.    - no ? blocker w/ ?low output and h/o bradycardia  - ASA 81, statin.  4. AKI on Stage III CKD: baseline SCr ~1.6. 2.31 on admit, in setting of sepsis and ?low output + fluid overload. Creatinine up to 3.4 today with CVP 19. BP has been stable.  - Continue milrinone 0.25 to optimize cardiac output.  - He would not be a good HD candidate. - Nephrology following.  5. PAD: Severe bilateral SFA disease on 8/21 peripheral angiogram, no revascularization options. Leg ulcers now healed, no longer going to wound clinic. Minimal claudication.Cilostazol recently discontinued 2/22 given CHF. 6. Paroxysmal Atrial fibrillation/flutter:  Appears to be in AF  this admission. Rate controlled. Off ? blocker due to bradycardia - Continue Eliquis 2.5 bid.  - Continue home amiodarone 100 mg daily for now.  7. COPD: followed by pulmonology   8. 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 He has had resulting imbalance.  - He is no longer driving.  9. Hypothyroidism: continue levothyroxine  10. Pulmonary nodule: 1.3 cm RML nodule on 1/19 CT chest. It was also seen in 2014 =>slow growing. Dr Lake Bells has discussed with patient and wife and they have decided not to go forward with biopsy. They would not be interested in lung cancer treatment. CT chest in 7/20 showed stable 1 cm RML nodule.  11. Delirium: Suspect due to medical illness. Mildly confused today.    I am very worried about his trajectory.  He has progressive volume overload and renal failure.  I do not think he is an HD candidate.  Now DNR, palliative care has seen and is in discussions regarding hospice care with family.   Length of Stay: [place], MD  04/17/2021, 9:39 AM  Advanced Heart Failure Team Pager [phone] (M-F; 7a - 5p)  Please contact [facility] for night-coverage after hours (5p -7a ) and weekends on [email or web address]

## 2021-04-17 NOTE — Progress Notes (Signed)
PROGRESS NOTE    Andrew [name]  ZOX:[id number] DOB: 10/01/1928 DOA: 04/13/2021 PCP: [name], MD   Brief Narrative:  Andrew [name] is a 87 y.o. male with medical history significant of combind HF, a fib on eliquis, hypothyroidism, HLD, PAD. Presenting with abdominal pain. History per wife as patient is a poor historian. Patient was originally seen this morning for abdominal pain. His wife reports that he's had RUQ ab pain for several months. It comes in waves. He's not seen a physician about it. He just dealt with it. It has apparently become worse over the last couple of days. The wife also noted that the patient has had a worsening of his leg wounds since he stopped going to the wound care center. He hasn't been to the center in at least 3 weeks. She says during that time, his legs have become more red and painful. He is not able to walk on his own at this point. She states that he stopped going just because he was tired of the hassle. She denies any other aggravating or alleviating factors. In ED: CT of abdomen and pelvis was revealing for cholelithiasis w/o acute inflammation. XR BLE was not concerning for osteo. CXR was concerning for CHF. He was found to have elevated tropinins and BNP. He was given vanc. Cardiology was consulted. [facility] was called for admission.  Assessment & Plan:   Active Problems:   Acute on chronic systolic CHF (congestive heart failure) (HCC)   Acute on chronic systolic congestive heart failure (HCC)   Cellulitis of left lower extremity  Goals of care discussion - Wife at bedside, lengthy discussion about patient's prognosis given advanced age, known advanced directives and living will per wife, patient has made wishes to be DNR prior to admission - Wife to follow-up with palliative care given poor prognosis in the setting of worsening heart failure, worsening urine output despite aggressive and increased doses of Lasix. - Patient's status continues to  decline despite aggressive treatment as outlined below. We appreciate cardiology and nephrology's assistance with this difficult case  Acute on chronic combined HF Elevated troponin in setting of sepsis - Cardiology consulted appreciate insight and recommendations - BNP markedly elevated in the setting of AKI - Last echo 01/2021 showed EF 25% w/ global hypokinesis - Continue heparin gtt - holding home Eliquis - Repeat CXR pending late this afternoon  AKI on CKD3b - Creatinine continues to worsen in the setting of cardiorenal disease, ongoing diuresis and likely poor renal perfusion - Renal ultrasound/CT abdomen without any obstructive findings - Bladder scan continues to show minimal urine, low urine output today despite aggressive diuretics, patient remains hypervolemic  Acute on chronic hypoxic respiratory failure on 3L [place] - Normally on 2 - 3 L [place] at night only - Currently maintain sats on home dosing; follow - Cardiac wheeze noted today on exam -increase Lasix per cardiology and nephrology  Sepsis secondary to BLE cellulitis, POA resolving - Discontinue vancomycin in the setting of above AKI - Continue ceftriaxone - Follow cultures -rule out dissemination of infection preliminary negative - Wound care following - Sepsis criteria resolved - Procalcitonin markedly positive  Acute metabolic encephalopathy, multifactorial  - In the setting of above  Hypothyroidism - Continue synthroid  PAF on eliquis - Blood pressure downtrending overnight, hold carvedilol - Transition from Eliquis to heparin as above  Hx of HTN - Pressures are soft, hold home regimen  Hx of PAD, HLD - Continue zetia, lipitior  Previously  reported abdominal pain, unspecified - Per previous facility patient had presented with abdominal pain, denies any abdominal pain or issues at this point - CT shows cholelithiasis without any inflammation, abdomen benign on exam  DVT prophylaxis: Heparin Code  Status: DNR, confirmed with wife at bedside, he has advanced directives/living will at home that she can bring in to confirm if necessary. Family Communication: Wife at bedside, over the phone  Status is: Inpatient  Dispo: The patient is from: Home              Anticipated d/c is to: To be determined              Anticipated d/c date is: >72 hours              Patient currently not medically stable for discharge  Consultants:   Cardiology, nephrology  Procedures:   None  Antimicrobials:  Vancomycin discontinued Ceftriaxone initiated 04/13/2021  Subjective: No acute issues or events overnight, creatinine continues to worsen although urine output minimally improving, not yet back to baseline.  Patient remains somnolent, review of systems somewhat limited.  Objective: Vitals:   04/16/21 1939 04/16/21 2300 04/17/21 0023 04/17/21 0327  BP: 117/63 130/60  113/65  Pulse: 69 67  62  Resp: 19 18  18   Temp:   98.2 F (36.8 C) 97.7 F (36.5 C)  TempSrc:   Oral Oral  SpO2: 94% 95%  96%  Weight:    84.4 kg  Height:        Intake/Output Summary (Last 24 hours) at 04/17/2021 0751 Last data filed at 04/17/2021 0631 Gross per 24 hour  Intake 1613.11 ml  Output 845 ml  Net 768.11 ml   Filed Weights   04/15/21 0400 04/16/21 0500 04/17/21 0327  Weight: 81.2 kg 84.1 kg 84.4 kg    Examination:  General exam: Appears calm and comfortable; somnolent but easily arousable alert to person and general situation only Respiratory system: Clear to auscultation. Respiratory effort normal. Cardiovascular system: S1 & S2 heard, RRR. No JVD, murmurs, rubs, gallops or clicks. No pedal edema. Gastrointestinal system: Abdomen is nondistended, soft and nontender. No organomegaly or masses felt. Normal bowel sounds heard.  Foley outputting dark orange urine, questionable bloody sediment in Foley tube Central nervous system: Without focal neurodeficit. Skin: No rashes   Data Reviewed: I have  personally reviewed following labs and imaging studies  CBC: Recent Labs  Lab 04/13/21 0124 04/14/21 0455 04/15/21 0546 04/16/21 0348 04/17/21 0318  WBC 14.6* 16.7* 18.9* 19.5* 15.4*  NEUTROABS 12.5* 14.1*  --   --   --   HGB 11.4* 10.2* 9.6* 9.1* 9.4*  HCT 36.6* 33.0* 31.2* 29.0* 29.8*  MCV 95.1 95.7 94.8 93.5 92.8  PLT 203 170 173 156 161   Basic Metabolic Panel: Recent Labs  Lab 04/13/21 2332 04/14/21 0455 04/15/21 0500 04/16/21 0348 04/17/21 0318  NA 137 139 136 132* 130*  K 4.3 4.3 4.3 4.1 4.3  CL 104 104 103 99 98  CO2 22 22 24 23 22   GLUCOSE 94 98 142* 163* 139*  BUN 74* 81* 89* 98* 106*  CREATININE 2.82* 2.91* 3.17* 3.39* 3.42*  CALCIUM 8.4* 8.5* 8.3* 8.0* 8.2*  MG  --   --   --   --  2.5*   GFR: Estimated Creatinine Clearance: 14.9 mL/min (A) (by C-G formula based on SCr of 3.42 mg/dL (H)). Liver Function Tests: Recent Labs  Lab 04/13/21 0124 04/14/21 0455  AST 40 101*  ALT 33 76*  ALKPHOS 92 77  BILITOT 1.1 1.1  PROT 6.8 5.8*  ALBUMIN 3.3* 2.7*   Recent Labs  Lab 04/13/21 0124  LIPASE 58*   No results for input(s): AMMONIA in the last 168 hours. Coagulation Profile: No results for input(s): INR, PROTIME in the last 168 hours. Cardiac Enzymes: No results for input(s): CKTOTAL, CKMB, CKMBINDEX, TROPONINI in the last 168 hours. BNP (last 3 results) No results for input(s): PROBNP in the last 8760 hours. HbA1C: No results for input(s): HGBA1C in the last 72 hours. CBG: Recent Labs  Lab 04/16/21 1216 04/16/21 1648 04/16/21 2046 04/17/21 0600 04/17/21 0736  GLUCAP 183* 171* 176* 142* 146*   Lipid Profile: No results for input(s): CHOL, HDL, LDLCALC, TRIG, CHOLHDL, LDLDIRECT in the last 72 hours. Thyroid Function Tests: No results for input(s): TSH, T4TOTAL, FREET4, T3FREE, THYROIDAB in the last 72 hours. Anemia Panel: No results for input(s): VITAMINB12, FOLATE, FERRITIN, TIBC, IRON, RETICCTPCT in the last 72 hours. Sepsis  Labs: Recent Labs  Lab 04/14/21 1355  PROCALCITON 3.61    Recent Results (from the past 240 hour(s))  Blood culture (routine x 2)     Status: None (Preliminary result)   Collection Time: 04/13/21 10:41 AM   Specimen: Left Antecubital; Blood  Result Value Ref Range Status   Specimen Description   Final    LEFT ANTECUBITAL Performed at [facility] [address] [place], [place] [address]    Special Requests   Final    BOTTLES DRAWN AEROBIC AND ANAEROBIC Blood Culture adequate volume Performed at [facility] [address], [place], [place] [address]    Culture   Final    NO GROWTH 3 DAYS Performed at [facility], [place] [address] [place], [place] [address]    Report Status PENDING  Incomplete  Blood culture (routine x 2)     Status: None (Preliminary result)   Collection Time: 04/13/21 10:51 AM   Specimen: Right Antecubital; Blood  Result Value Ref Range Status   Specimen Description   Final    RIGHT ANTECUBITAL Performed at [facility] [address], [place], [place] [address]    Special Requests   Final    BOTTLES DRAWN AEROBIC AND ANAEROBIC Blood Culture adequate volume Performed at [facility] [address], [place], [place] [address]    Culture   Final    NO GROWTH 3 DAYS Performed at [facility], [place] [address] [place], [place] [address]    Report Status PENDING  Incomplete  Resp Panel by RT-PCR (Flu A&B, Covid) Nasopharyngeal Swab     Status: None   Collection Time: 04/13/21 10:59 AM   Specimen: Nasopharyngeal Swab; Nasopharyngeal(NP) swabs in vial transport medium  Result Value Ref Range Status   SARS Coronavirus 2 by RT PCR NEGATIVE NEGATIVE Final    Comment: (NOTE) SARS-CoV-2 target nucleic acids are NOT DETECTED.  The SARS-CoV-2 RNA is generally detectable in upper respiratory specimens during the acute phase of infection. The lowest concentration of  SARS-CoV-2 viral copies this assay can detect is 138 copies/mL. A negative result does not preclude SARS-Cov-2 infection and should not be used as the sole basis for treatment or other patient management decisions. A negative result may occur with  improper specimen collection/handling, submission of specimen other than nasopharyngeal swab, presence of viral mutation(s) within the areas targeted by this assay, and inadequate number of viral copies(<138 copies/mL). A negative  result must be combined with clinical observations, patient history, and epidemiological information. The expected result is Negative.  Fact Sheet for Patients:  EntrepreneurPulse.com.au  Fact Sheet for Healthcare Providers:  IncredibleEmployment.be  This test is no t yet approved or cleared by the [place] FDA and  has been authorized for detection and/or diagnosis of SARS-CoV-2 by FDA under an Emergency Use Authorization (EUA). This EUA will remain  in effect (meaning this test can be used) for the duration of the COVID-19 declaration under Section 564(b)(1) of the Act, 21 U.S.C.section 360bbb-3(b)(1), unless the authorization is terminated  or revoked sooner.       Influenza A by PCR NEGATIVE NEGATIVE Final   Influenza B by PCR NEGATIVE NEGATIVE Final    Comment: (NOTE) The Xpert Xpress SARS-CoV-2/FLU/RSV plus assay is intended as an aid in the diagnosis of influenza from Nasopharyngeal swab specimens and should not be used as a sole basis for treatment. Nasal washings and aspirates are unacceptable for Xpert Xpress SARS-CoV-2/FLU/RSV testing.  Fact Sheet for Patients: EntrepreneurPulse.com.au  Fact Sheet for Healthcare Providers: IncredibleEmployment.be  This test is not yet approved or cleared by the [place] FDA and has been authorized for detection and/or diagnosis of SARS-CoV-2 by FDA under an Emergency Use  Authorization (EUA). This EUA will remain in effect (meaning this test can be used) for the duration of the COVID-19 declaration under Section 564(b)(1) of the Act, 21 U.S.C. section 360bbb-3(b)(1), unless the authorization is terminated or revoked.  Performed at [facility], [place] [address], [name], [place] [address]       Radiology Studies: US RENAL  Result Date: 04/15/2021 CLINICAL DATA:  Urinary retention EXAM: RENAL / URINARY TRACT ULTRASOUND COMPLETE COMPARISON:  04/13/2021 FINDINGS: Right Kidney: Renal measurements: 13.8 x 7.5 x 5.4 cm. = volume: 296 mL. Increased echogenicity is noted. Multiple cysts are noted the largest of which measures 4.5 cm in the lower pole of the right kidney. These changes are similar to that seen on the prior CT examination. No mass lesion or hydronephrosis is noted. Left Kidney: Renal measurements: 13.4 x 6.6 x 6.5 cm. = volume: 302 mL. Mild increased echogenicity is noted. Cysts are noted within the left kidney also similar to that seen on the prior CT examination. The largest of these measures 6.9 cm in greatest dimension. No obstructive changes are seen. Bladder: Partially distended. Other: None. IMPRESSION: Bilateral renal cysts similar to that seen on the prior CT examination. Mild increased echogenicity consistent with medical renal disease. No obstructive changes are noted. Electronically Signed   By: Inez Catalina M.D.   On: 04/15/2021 19:19   DG Chest Port 1 View  Result Date: 04/16/2021 CLINICAL DATA:  Hypoxia. EXAM: PORTABLE CHEST 1 VIEW COMPARISON:  One-view chest x-ray 04/13/2021 FINDINGS: Heart is enlarged. Atherosclerotic calcifications are present at the aortic arch. Heart size exaggerated by low lung volumes. Left greater than right pleural effusions are present. Bibasilar airspace disease is again noted. Mild pulmonary vascular congestion is stable. Right-sided PICC line is stable. The tip is at the cavoatrial junction.  IMPRESSION: 1. Stable cardiomegaly and mild pulmonary vascular congestion. 2. Left greater than right pleural effusions and bibasilar airspace disease. While this likely reflects atelectasis, infection is not excluded. 3. Stable right-sided PICC line terminates at the cavoatrial junction. Electronically Signed   By: San Morelle M.D.   On: 04/16/2021 16:25    Scheduled Meds: . (feeding supplement) PROSource Plus  30 mL Oral BID BM  .  amiodarone  100 mg Oral Daily  . apixaban  2.5 mg Oral BID  . aspirin  81 mg Oral Daily  . atorvastatin  80 mg Oral QPM  . Chlorhexidine Gluconate Cloth  6 each Topical Daily  . ezetimibe  10 mg Oral Daily  . FLUoxetine  20 mg Oral Daily  . gabapentin  100 mg Oral TID  . levothyroxine  100 mcg Oral QAC breakfast  . multivitamin with minerals  1 tablet Oral Daily  . silver sulfADIAZINE   Topical Daily  . sodium chloride flush  10-40 mL Intracatheter Q12H   Continuous Infusions: . cefTRIAXone (ROCEPHIN)  IV Stopped (04/16/21 1713)  . furosemide (LASIX) 200 mg in dextrose 5% 100 mL (2mg /mL) infusion 10 mg/hr (04/17/21 0631)  . milrinone 0.25 mcg/kg/min (04/17/21 0631)     LOS: 4 days   Time spent: 34min  Myesha Stillion C Manaal Mandala, DO [facility]  If 7PM-7AM, please contact night-coverage [email or web address]  04/17/2021, 7:51 AM

## 2021-04-17 NOTE — Progress Notes (Addendum)
Fentanyl gtt started by this RN.  This nurse spoke with wife regarding pt love for piano music.  Wife expressed she would love to have someone play piano for pt, this nurse allowed for someone to come and play piano for pt.   Wife stated she will have someone come and play a piano piece for him and that pt would really love that.   This nurse made MD and palliative NP know of fam wishes.   pts spouse left for today.  Pt is currently in bed with eyes close, and listening to gentle piano music in room.   This  RN will continue to monitor.

## 2021-04-18 DIAGNOSIS — L03116 Cellulitis of left lower limb: Secondary | ICD-10-CM | POA: Diagnosis not present

## 2021-04-18 DIAGNOSIS — I5023 Acute on chronic systolic (congestive) heart failure: Secondary | ICD-10-CM | POA: Diagnosis not present

## 2021-04-18 DIAGNOSIS — N184 Chronic kidney disease, stage 4 (severe): Secondary | ICD-10-CM | POA: Diagnosis not present

## 2021-04-18 DIAGNOSIS — L03115 Cellulitis of right lower limb: Secondary | ICD-10-CM | POA: Diagnosis not present

## 2021-04-18 LAB — CULTURE, BLOOD (ROUTINE X 2)
Culture: NO GROWTH
Culture: NO GROWTH
Special Requests: ADEQUATE
Special Requests: ADEQUATE

## 2021-04-18 MED ORDER — GLYCOPYRROLATE 0.2 MG/ML IJ SOLN
0.3000 mg | Freq: Once | INTRAMUSCULAR | Status: AC
  Administered 2021-04-18: 0.3 mg via INTRAVENOUS
  Filled 2021-04-18: qty 2

## 2021-04-18 NOTE — Progress Notes (Signed)
Patient ID: Andrew [name], male   DOB: October 24, 1928, 87 y.o.   MRN: [id number]     Advanced Heart Failure Rounding Note  PCP-Cardiologist: Loralie Champagne, MD   Subjective:    Comfort care, milrinone now off.  No labs.  Sleeping, appears comfortable on Fentanyl gtt.    Objective:   Weight Range: 84.4 kg Body mass index is 29.14 kg/m.   Vital Signs:   Pulse Rate:  [56-65] 56 (04/24 0800) Resp:  [10-18] 10 (04/24 0800) BP: (120-126)/(61-68) 120/61 (04/23 1924) SpO2:  [95 %-96 %] 96 % (04/24 0800) Last BM Date: 04/17/21  Weight change: Filed Weights   04/15/21 0400 04/16/21 0500 04/17/21 0327  Weight: 81.2 kg 84.1 kg 84.4 kg    Intake/Output:   Intake/Output Summary (Last 24 hours) at 04/18/2021 1015 Last data filed at 04/18/2021 0900 Gross per 24 hour  Intake 151.25 ml  Output 1100 ml  Net -948.75 ml      Physical Exam   General: Sleeping, NAD Neck: JVP 14 cm, no thyromegaly or thyroid nodule.  Lungs: End expiratory wheezes CV: Nondisplaced PMI.  Heart irregular S1/S2, no S3/S4, no murmur.  1+ edema to knees.   Abdomen: Soft, nontender, no hepatosplenomegaly, no distention.  Skin: Lower leg erythemia.  Neurologic: Sleeping  Extremities: No clubbing or cyanosis.  HEENT: Normal.     Labs    CBC Recent Labs    04/16/21 0348 04/17/21 0318  WBC 19.5* 15.4*  HGB 9.1* 9.4*  HCT 29.0* 29.8*  MCV 93.5 92.8  PLT 156 301   Basic Metabolic Panel Recent Labs    04/16/21 0348 04/17/21 0318  NA 132* 130*  K 4.1 4.3  CL 99 98  CO2 23 22  GLUCOSE 163* 139*  BUN 98* 106*  CREATININE 3.39* 3.42*  CALCIUM 8.0* 8.2*  MG  --  2.5*   Liver Function Tests No results for input(s): AST, ALT, ALKPHOS, BILITOT, PROT, ALBUMIN in the last 72 hours. No results for input(s): LIPASE, AMYLASE in the last 72 hours. Cardiac Enzymes No results for input(s): CKTOTAL, CKMB, CKMBINDEX, TROPONINI in the last 72 hours.  BNP: BNP (last 3 results) Recent Labs     06/03/20 0604 02/20/21 0313 04/13/21 1051  BNP 1,981.3* 1,833.3* >4,500.0*    ProBNP (last 3 results) No results for input(s): PROBNP in the last 8760 hours.   D-Dimer No results for input(s): DDIMER in the last 72 hours. Hemoglobin A1C No results for input(s): HGBA1C in the last 72 hours. Fasting Lipid Panel No results for input(s): CHOL, HDL, LDLCALC, TRIG, CHOLHDL, LDLDIRECT in the last 72 hours. Thyroid Function Tests No results for input(s): TSH, T4TOTAL, T3FREE, THYROIDAB in the last 72 hours.  Invalid input(s): FREET3  Other results:   Imaging    No results found.   Medications:     Scheduled Medications: . amiodarone  100 mg Oral Daily  . silver sulfADIAZINE   Topical Daily  . sodium chloride flush  10-40 mL Intracatheter Q12H    Infusions: . fentaNYL infusion INTRAVENOUS 20 mcg/hr (04/17/21 1621)  . furosemide      PRN Medications: acetaminophen, antiseptic oral rinse, fentaNYL, glycopyrrolate **OR** glycopyrrolate, LORazepam **OR** LORazepam **OR** LORazepam, nitroGLYCERIN, ondansetron (ZOFRAN) IV, polyvinyl alcohol, sodium chloride flush     Assessment/Plan   With progressive renal failure and end stage CHF, patient has been made DNR/comfort care.  Hopefully will be able to go to [facility].   Continue care per palliative service.  Reasonable to continue  current IV Lasix while hospitalized and then Lasix 120 mg po bid if he is able to go to [facility].  Other cardiac meds stopped.   Length of Stay: [place], MD  04/18/2021, 10:15 AM  Advanced Heart Failure Team Pager [phone] (M-F; 7a - 5p)  Please contact [facility] for night-coverage after hours (5p -7a ) and weekends on [email or web address]

## 2021-04-18 NOTE — Progress Notes (Addendum)
Daily Progress Note   Patient Name: Andrew [name]       Date: 04/18/2021 DOB: Sep 22, 1928  Age: 87 y.o. MRN#: [id number] Attending Physician: Little Ishikawa, MD Primary Care Physician: [name], MD Admit Date: 04/13/2021  Reason for Consultation/Follow-up: end of life care, symptom management  Subjective: Patient appears uncomfortable - breathing is mildly labored and slight facial grimacing noted. Increased respiratory secretions noted. He opens his eyes spontaneously while I am in the room but does not track or follow commands.  Fentanyl infusion is at 20 mcg/hr. I administer a bolus dose of 20 mcg over 2 minutes. I also decreased oxygen down to 2L.   I spoke with bedside RN and let her know I administered a bolus dose. She reports patient was verbally responsive earlier this morning and denied pain, but she agrees that he appears uncomfortable.   Wife is not at bedside currently. I left a copy of "gone from my sight" at bedside.   Length of Stay: 5  Current Medications: Scheduled Meds:  . amiodarone  100 mg Oral Daily  . silver sulfADIAZINE   Topical Daily  . sodium chloride flush  10-40 mL Intracatheter Q12H    Continuous Infusions: . fentaNYL infusion INTRAVENOUS 20 mcg/hr (04/17/21 1621)  . furosemide 160 mg (04/18/21 1205)    PRN Meds: acetaminophen, antiseptic oral rinse, fentaNYL, glycopyrrolate **OR** glycopyrrolate, LORazepam **OR** LORazepam **OR** LORazepam, nitroGLYCERIN, ondansetron (ZOFRAN) IV, polyvinyl alcohol, sodium chloride flush      Vital Signs: BP 117/73 (BP Location: Left Arm)   Pulse (!) 55   Temp 97.7 F (36.5 C) (Oral)   Resp 12   Ht 5\' 7"  (1.702 m)   Wt 84.4 kg   SpO2 96%   BMI 29.14 kg/m  SpO2: SpO2: 96 % O2 Device: O2  Device: Nasal Cannula O2 Flow Rate: O2 Flow Rate (L/min): 3 L/min  Intake/output summary:   Intake/Output Summary (Last 24 hours) at 04/18/2021 1239 Last data filed at 04/18/2021 0900 Gross per 24 hour  Intake 31.25 ml  Output 950 ml  Net -918.75 ml   LBM: Last BM Date: 04/17/21 Baseline Weight: Weight: 77.1 kg Most recent weight: Weight: 84.4 kg       Palliative Assessment/Data: PPS 10%        Palliative Care Assessment & Plan  HPI/Patient Profile: 87 y.o. male  with past medical history of CKD stage IIIb, CAD s/p CABG, ischemic cardiomyopathy with primarily diastolic CHF, atrial fibrillation on eliquis, and PAD. He presented to the emergency department on 04/13/2021 with abdominal pain. His wife also reported worsening of his leg wounds.  CT of abdomen and pelvis revealing cholelithiasis without acute inflammation. Chest x-ray concerning for CHF. Found to have elevated troponin and BNP. Admitted to [facility] for sepsis secondary to BLE cellulitis, AKI on CKD, and acute on chronic combined HF.    Per cardiology, patient is not a candidate for  HD given age and cardiomyopathy.   Assessment: - acute on chronic systolic CHF - sepsis secondary to cellulitis of BLE - AKI on CKD 3b - elevated troponin - acute on chronic hypoxic respiratory failure - acute metabolic encephalopathy - paroxysmal a fib   Recommendations/Plan: Continue full comfort measures initiated DNR/DNI as previously documented Continue fentanyl infusion and administer bolus dose as needed Robinul 0.3 mg IV now x 1 dose Continue IV lasix for now - will defer to cardiology Provide frequent assessments and administer PRN medications as clinically necessary to ensure EOL comfort PMT will continue to follow holistically    Goals of Care and Additional Recommendations: Limitations on Scope of Treatment: Full Comfort Care  Code Status: DNR/DNI  Prognosis:  Hours - Days  Discharge Planning: Anticipated Hospital  Death  Care plan was discussed with bedside RN  Thank you for allowing the Palliative Medicine Team to assist in the care of this patient.   Total Time 15 minutes Prolonged Time Billed  no      Greater than 50%  of this time was spent counseling and coordinating care related to the above assessment and plan.  Lavena Bullion, NP  Please contact Palliative Medicine Team phone at [phone] for questions and concerns.

## 2021-04-18 NOTE — Progress Notes (Signed)
PROGRESS NOTE    Andrew [name]  NGE:[id number] DOB: 1928-09-18 DOA: 04/13/2021 PCP: [name], MD   Brief Narrative:  Andrew [name] is a 87 y.o. male with medical history significant of combind HF, a fib on eliquis, hypothyroidism, HLD, PAD. Presenting with abdominal pain. History per wife as patient is a poor historian. Patient was originally seen this morning for abdominal pain. His wife reports that he's had RUQ ab pain for several months. It comes in waves. He's not seen a physician about it. He just dealt with it. It has apparently become worse over the last couple of days. The wife also noted that the patient has had a worsening of his leg wounds since he stopped going to the wound care center. He hasn't been to the center in at least 3 weeks. She says during that time, his legs have become more red and painful. He is not able to walk on his own at this point. She states that he stopped going just because he was tired of the hassle. She denies any other aggravating or alleviating factors. In ED: CT of abdomen and pelvis was revealing for cholelithiasis w/o acute inflammation. XR BLE was not concerning for osteo. CXR was concerning for CHF. He was found to have elevated tropinins and BNP. He was given vanc. Cardiology was consulted. [facility] was called for admission.  Assessment & Plan:   Active Problems:   Acute on chronic systolic CHF (congestive heart failure) (HCC)   Acute on chronic systolic congestive heart failure (HCC)   Cellulitis of left lower extremity  Goals of care discussion -Patient has been made comfort measures after lengthy discussion with family previously, palliative care assisting -appreciate insight and recommendations -Continue fentanyl drip for comfort, will discontinue all other labs, unnecessary medications or interventions. -Somewhat convoluted and at times inappropriate discussion this morning but patient seemed fairly awake and alert, he continues to  remain stable we discussed disposition to hospice house which was preferred per previous discussion with wife and family  Acute on chronic combined HF Elevated troponin in setting of sepsis -No further invasive procedures or medications, continue comfort measures as above  AKI on CKD3b -No further invasive procedures or medications, continue comfort measures as above  Acute on chronic hypoxic respiratory failure on 3L [place] -No further invasive procedures or medications, continue comfort measures as above  Sepsis secondary to BLE cellulitis, POA resolving -No further invasive procedures or medications, continue comfort measures as above  Acute metabolic encephalopathy, multifactorial  - In the setting of above  Hypothyroidism -Hold any further nonsustained medications  PAF on eliquis -No further invasive procedures or medications, continue comfort measures as above -Certainly rate control medications could be of use if patient becomes symptomatic from A. fib but at this time appears stable  Hx of HTN Hx of PAD, HLD  Code Status: DNR, transition to comfort measures Family Communication: Wife at bedside, over the phone  Status is: Inpatient  Dispo: The patient is from: Home              Anticipated d/c is to: To be determined              Anticipated d/c date is: >72 hours              Patient currently not medically stable for discharge  Consultants:   Cardiology, nephrology  Procedures:   None  Antimicrobials:  Vancomycin discontinued Ceftriaxone initiated 04/13/2021  Subjective: No issues or events  overnight, tolerating fentanyl drip quite well appears very comfortable  Objective: Vitals:   04/17/21 0800 04/17/21 1343 04/17/21 1924 04/18/21 0800  BP: 114/62 126/68 120/61   Pulse: 65 65 (!) 57 (!) 56  Resp:  18 16 10   Temp:      TempSrc:      SpO2: 91% 95% 95% 96%  Weight:      Height:        Intake/Output Summary (Last 24 hours) at 04/18/2021 1150 Last  data filed at 04/18/2021 0900 Gross per 24 hour  Intake 31.25 ml  Output 950 ml  Net -918.75 ml   Filed Weights   04/15/21 0400 04/16/21 0500 04/17/21 0327  Weight: 81.2 kg 84.1 kg 84.4 kg    Examination:  General exam: Appears calm and comfortable; somnolent but easily arousable alert to person and general situation only Respiratory system: Clear to auscultation. Respiratory effort normal. Cardiovascular system: S1 & S2 heard, RRR. No JVD, murmurs, rubs, gallops or clicks. No pedal edema. Gastrointestinal system: Abdomen is nondistended, soft and nontender. No organomegaly or masses felt. Normal bowel sounds heard.  Foley outputting dark orange urine, questionable bloody sediment in Foley tube Central nervous system: Without focal neurodeficit. Skin: No rashes   Data Reviewed: I have personally reviewed following labs and imaging studies  CBC: Recent Labs  Lab 04/13/21 0124 04/14/21 0455 04/15/21 0546 04/16/21 0348 04/17/21 0318  WBC 14.6* 16.7* 18.9* 19.5* 15.4*  NEUTROABS 12.5* 14.1*  --   --   --   HGB 11.4* 10.2* 9.6* 9.1* 9.4*  HCT 36.6* 33.0* 31.2* 29.0* 29.8*  MCV 95.1 95.7 94.8 93.5 92.8  PLT 203 170 173 156 811   Basic Metabolic Panel: Recent Labs  Lab 04/13/21 2332 04/14/21 0455 04/15/21 0500 04/16/21 0348 04/17/21 0318  NA 137 139 136 132* 130*  K 4.3 4.3 4.3 4.1 4.3  CL 104 104 103 99 98  CO2 22 22 24 23 22   GLUCOSE 94 98 142* 163* 139*  BUN 74* 81* 89* 98* 106*  CREATININE 2.82* 2.91* 3.17* 3.39* 3.42*  CALCIUM 8.4* 8.5* 8.3* 8.0* 8.2*  MG  --   --   --   --  2.5*   GFR: Estimated Creatinine Clearance: 14.9 mL/min (A) (by C-G formula based on SCr of 3.42 mg/dL (H)). Liver Function Tests: Recent Labs  Lab 04/13/21 0124 04/14/21 0455  AST 40 101*  ALT 33 76*  ALKPHOS 92 77  BILITOT 1.1 1.1  PROT 6.8 5.8*  ALBUMIN 3.3* 2.7*   Recent Labs  Lab 04/13/21 0124  LIPASE 58*   No results for input(s): AMMONIA in the last 168  hours. Coagulation Profile: No results for input(s): INR, PROTIME in the last 168 hours. Cardiac Enzymes: No results for input(s): CKTOTAL, CKMB, CKMBINDEX, TROPONINI in the last 168 hours. BNP (last 3 results) No results for input(s): PROBNP in the last 8760 hours. HbA1C: No results for input(s): HGBA1C in the last 72 hours. CBG: Recent Labs  Lab 04/16/21 1648 04/16/21 2046 04/17/21 0600 04/17/21 0736 04/17/21 1126  GLUCAP 171* 176* 142* 146* 191*   Lipid Profile: No results for input(s): CHOL, HDL, LDLCALC, TRIG, CHOLHDL, LDLDIRECT in the last 72 hours. Thyroid Function Tests: No results for input(s): TSH, T4TOTAL, FREET4, T3FREE, THYROIDAB in the last 72 hours. Anemia Panel: No results for input(s): VITAMINB12, FOLATE, FERRITIN, TIBC, IRON, RETICCTPCT in the last 72 hours. Sepsis Labs: Recent Labs  Lab 04/14/21 1355  PROCALCITON 3.61    Recent Results (  from the past 240 hour(s))  Blood culture (routine x 2)     Status: None   Collection Time: 04/13/21 10:41 AM   Specimen: Left Antecubital; Blood  Result Value Ref Range Status   Specimen Description   Final    LEFT ANTECUBITAL Performed at [facility] [address] [place], [place] [address]    Special Requests   Final    BOTTLES DRAWN AEROBIC AND ANAEROBIC Blood Culture adequate volume Performed at [facility] [address], [place], [place] [address]    Culture   Final    NO GROWTH 5 DAYS Performed at [facility], [place] [address], [place], [place] [address]    Report Status 04/18/2021 FINAL  Final  Blood culture (routine x 2)     Status: None   Collection Time: 04/13/21 10:51 AM   Specimen: Right Antecubital; Blood  Result Value Ref Range Status   Specimen Description   Final    RIGHT ANTECUBITAL Performed at [facility] [address] [place], [place] [address]    Special Requests   Final    BOTTLES DRAWN AEROBIC AND ANAEROBIC  Blood Culture adequate volume Performed at [facility] [address] [place], [place] [address]    Culture   Final    NO GROWTH 5 DAYS Performed at [facility], [place] [address], [place],  [address]    Report Status 04/18/2021 FINAL  Final  Resp Panel by RT-PCR (Flu A&B, Covid) Nasopharyngeal Swab     Status: None   Collection Time: 04/13/21 10:59 AM   Specimen: Nasopharyngeal Swab; Nasopharyngeal(NP) swabs in vial transport medium  Result Value Ref Range Status   SARS Coronavirus 2 by RT PCR NEGATIVE NEGATIVE Final    Comment: (NOTE) SARS-CoV-2 target nucleic acids are NOT DETECTED.  The SARS-CoV-2 RNA is generally detectable in upper respiratory specimens during the acute phase of infection. The lowest concentration of SARS-CoV-2 viral copies this assay can detect is 138 copies/mL. A negative result does not preclude SARS-Cov-2 infection and should not be used as the sole basis for treatment or other patient management decisions. A negative result may occur with  improper specimen collection/handling, submission of specimen other than nasopharyngeal swab, presence of viral mutation(s) within the areas targeted by this assay, and inadequate number of viral copies(<138 copies/mL). A negative result must be combined with clinical observations, patient history, and epidemiological information. The expected result is Negative.  Fact Sheet for Patients:  EntrepreneurPulse.com.au  Fact Sheet for Healthcare Providers:  IncredibleEmployment.be  This test is no t yet approved or cleared by the [place] FDA and  has been authorized for detection and/or diagnosis of SARS-CoV-2 by FDA under an Emergency Use Authorization (EUA). This EUA will remain  in effect (meaning this test can be used) for the duration of the COVID-19 declaration under Section 564(b)(1) of the Act, 21 U.S.C.section 360bbb-3(b)(1), unless the  authorization is terminated  or revoked sooner.       Influenza A by PCR NEGATIVE NEGATIVE Final   Influenza B by PCR NEGATIVE NEGATIVE Final    Comment: (NOTE) The Xpert Xpress SARS-CoV-2/FLU/RSV plus assay is intended as an aid in the diagnosis of influenza from Nasopharyngeal swab specimens and should not be used as a sole basis for treatment. Nasal washings and aspirates are unacceptable for Xpert Xpress SARS-CoV-2/FLU/RSV testing.  Fact Sheet for Patients: EntrepreneurPulse.com.au  Fact Sheet for Healthcare Providers: IncredibleEmployment.be  This  test is not yet approved or cleared by the [place] and has been authorized for detection and/or diagnosis of SARS-CoV-2 by FDA under an Emergency Use Authorization (EUA). This EUA will remain in effect (meaning this test can be used) for the duration of the COVID-19 declaration under Section 564(b)(1) of the Act, 21 U.S.C. section 360bbb-3(b)(1), unless the authorization is terminated or revoked.  Performed at [facility], [place] [address], [place], [place] [address]       Radiology Studies: [facility] Chest Port 1 View  Result Date: 04/16/2021 CLINICAL DATA:  Hypoxia. EXAM: PORTABLE CHEST 1 VIEW COMPARISON:  One-view chest x-ray 04/13/2021 FINDINGS: Heart is enlarged. Atherosclerotic calcifications are present at the aortic arch. Heart size exaggerated by low lung volumes. Left greater than right pleural effusions are present. Bibasilar airspace disease is again noted. Mild pulmonary vascular congestion is stable. Right-sided PICC line is stable. The tip is at the cavoatrial junction. IMPRESSION: 1. Stable cardiomegaly and mild pulmonary vascular congestion. 2. Left greater than right pleural effusions and bibasilar airspace disease. While this likely reflects atelectasis, infection is not excluded. 3. Stable right-sided PICC line terminates at the cavoatrial junction.  Electronically Signed   By: San Morelle M.D.   On: 04/16/2021 16:25    Scheduled Meds: . amiodarone  100 mg Oral Daily  . silver sulfADIAZINE   Topical Daily  . sodium chloride flush  10-40 mL Intracatheter Q12H   Continuous Infusions: . fentaNYL infusion INTRAVENOUS 20 mcg/hr (04/17/21 1621)  . furosemide       LOS: 5 days   Time spent: 14min  Mckinze Poirier C Shevonne Wolf, DO [facility]  If 7PM-7AM, please contact night-coverage [email or web address]  04/18/2021, 11:50 AM

## 2021-04-18 NOTE — Progress Notes (Signed)
[facility] ROUNDING NOTE   Subjective:   Interval History: This is a 87 year old gent with a history of chronic kidney disease stage IV, congestive heart failure, COPD diabetes mellitus, hypertension hyperlipidemia presents to the emergency room 04/13/2021 with abdominal pain.  Possible NSTEMI and bilateral lower extremity cellulitis.  His baseline serum creatinine is about 2 mg/dL.  He was admitted with serum creatinine increased about 3.4 mg/dL.  His renal ultrasound did not show any evidence of hydronephrosis.  He is admitted with some volume overload and we are increasing his Lasix 160 mg every 12 hours to improve diuresis.  His last TSH was 1.94 01/14/2021.  He is not a candidate for dialysis in my opinion.   Blood pressure 120/61 pulse 65 afebrile O2 sats 96% 3 L nasal cannula.  Urine output 525 cc 04/17/2021   Labs pending  Medications: Amiodarone 100 mg daily, Eliquis 2.5 mg twice daily, aspirin 81 mg daily, atorvastatin 80 mg daily, Zetia 10 mg daily, Prozac 20 mg daily, Neurontin 100 mg 3 times daily, Synthroid 100 mcg daily, multivitamins 1 daily.  IV Lasix drip 10 mg/h IV milrinone IV ceftriaxone     Objective:  Vital signs in last 24 hours:  Pulse Rate:  [56-65] 56 (04/24 0800) Resp:  [10-18] 10 (04/24 0800) BP: (120-126)/(61-68) 120/61 (04/23 1924) SpO2:  [95 %-96 %] 96 % (04/24 0800)  Weight change:  Filed Weights   04/15/21 0400 04/16/21 0500 04/17/21 0327  Weight: 81.2 kg 84.1 kg 84.4 kg    Intake/Output: I/O last 3 completed shifts: In: 1104.4 [P.O.:600; I.V.:304.4; IV Piggyback:200] Out: 1275 [Urine:1275]   Intake/Output this shift:  Total I/O In: -  Out: 200 [Urine:200]  CVS- RRR no murmurs rubs gallops RS- CTA significant expiratory wheezes 3 L nasal cannula ABD- BS present soft non-distended EXT-2-3+ lower extremity edema   Basic Metabolic Panel: Recent Labs  Lab 04/13/21 2332 04/14/21 0455 04/15/21 0500 04/16/21 0348  04/17/21 0318  NA 137 139 136 132* 130*  K 4.3 4.3 4.3 4.1 4.3  CL 104 104 103 99 98  CO2 22 22 24 23 22   GLUCOSE 94 98 142* 163* 139*  BUN 74* 81* 89* 98* 106*  CREATININE 2.82* 2.91* 3.17* 3.39* 3.42*  CALCIUM 8.4* 8.5* 8.3* 8.0* 8.2*  MG  --   --   --   --  2.5*    Liver Function Tests: Recent Labs  Lab 04/13/21 0124 04/14/21 0455  AST 40 101*  ALT 33 76*  ALKPHOS 92 77  BILITOT 1.1 1.1  PROT 6.8 5.8*  ALBUMIN 3.3* 2.7*   Recent Labs  Lab 04/13/21 0124  LIPASE 58*   No results for input(s): AMMONIA in the last 168 hours.  CBC: Recent Labs  Lab 04/13/21 0124 04/14/21 0455 04/15/21 0546 04/16/21 0348 04/17/21 0318  WBC 14.6* 16.7* 18.9* 19.5* 15.4*  NEUTROABS 12.5* 14.1*  --   --   --   HGB 11.4* 10.2* 9.6* 9.1* 9.4*  HCT 36.6* 33.0* 31.2* 29.0* 29.8*  MCV 95.1 95.7 94.8 93.5 92.8  PLT 203 170 173 156 158    Cardiac Enzymes: No results for input(s): CKTOTAL, CKMB, CKMBINDEX, TROPONINI in the last 168 hours.  BNP: Invalid input(s): POCBNP  CBG: Recent Labs  Lab 04/16/21 1648 04/16/21 2046 04/17/21 0600 04/17/21 0736 04/17/21 1126  GLUCAP 171* 176* 142* 146* 191*    Microbiology: Results for orders placed or performed during the hospital encounter of 04/13/21  Blood culture (routine x 2)  Status: None   Collection Time: 04/13/21 10:41 AM   Specimen: Left Antecubital; Blood  Result Value Ref Range Status   Specimen Description   Final    LEFT ANTECUBITAL Performed at [facility], [place] [address] [place], [place] [address]    Special Requests   Final    BOTTLES DRAWN AEROBIC AND ANAEROBIC Blood Culture adequate volume Performed at [place] [address], [place], [place] [address]    Culture   Final    NO GROWTH 5 DAYS Performed at [facility], [place] [address], [place], [place] [address]    Report Status 04/18/2021 FINAL  Final  Blood culture (routine x 2)     Status: None    Collection Time: 04/13/21 10:51 AM   Specimen: Right Antecubital; Blood  Result Value Ref Range Status   Specimen Description   Final    RIGHT ANTECUBITAL Performed at [facility] [address], [place], [place] [address]    Special Requests   Final    BOTTLES DRAWN AEROBIC AND ANAEROBIC Blood Culture adequate volume Performed at [place] [address], [place], [place] [address]    Culture   Final    NO GROWTH 5 DAYS Performed at [facility], [place] [address], [place],  [address]    Report Status 04/18/2021 FINAL  Final  Resp Panel by RT-PCR (Flu A&B, Covid) Nasopharyngeal Swab     Status: None   Collection Time: 04/13/21 10:59 AM   Specimen: Nasopharyngeal Swab; Nasopharyngeal(NP) swabs in vial transport medium  Result Value Ref Range Status   SARS Coronavirus 2 by RT PCR NEGATIVE NEGATIVE Final    Comment: (NOTE) SARS-CoV-2 target nucleic acids are NOT DETECTED.  The SARS-CoV-2 RNA is generally detectable in upper respiratory specimens during the acute phase of infection. The lowest concentration of SARS-CoV-2 viral copies this assay can detect is 138 copies/mL. A negative result does not preclude SARS-Cov-2 infection and should not be used as the sole basis for treatment or other patient management decisions. A negative result may occur with  improper specimen collection/handling, submission of specimen other than nasopharyngeal swab, presence of viral mutation(s) within the areas targeted by this assay, and inadequate number of viral copies(<138 copies/mL). A negative result must be combined with clinical observations, patient history, and epidemiological information. The expected result is Negative.  Fact Sheet for Patients:  EntrepreneurPulse.com.au  Fact Sheet for Healthcare Providers:  IncredibleEmployment.be  This test is no t yet approved or cleared by the [place]  FDA and  has been authorized for detection and/or diagnosis of SARS-CoV-2 by FDA under an Emergency Use Authorization (EUA). This EUA will remain  in effect (meaning this test can be used) for the duration of the COVID-19 declaration under Section 564(b)(1) of the Act, 21 U.S.C.section 360bbb-3(b)(1), unless the authorization is terminated  or revoked sooner.       Influenza A by PCR NEGATIVE NEGATIVE Final   Influenza B by PCR NEGATIVE NEGATIVE Final    Comment: (NOTE) The Xpert Xpress SARS-CoV-2/FLU/RSV plus assay is intended as an aid in the diagnosis of influenza from Nasopharyngeal swab specimens and should not be used as a sole basis for treatment. Nasal washings and aspirates are unacceptable for Xpert Xpress SARS-CoV-2/FLU/RSV testing.  Fact Sheet for Patients: EntrepreneurPulse.com.au  Fact Sheet for Healthcare Providers: IncredibleEmployment.be  This test is not yet approved or cleared by the [place] FDA and has been  authorized for detection and/or diagnosis of SARS-CoV-2 by FDA under an Emergency Use Authorization (EUA). This EUA will remain in effect (meaning this test can be used) for the duration of the COVID-19 declaration under Section 564(b)(1) of the Act, 21 U.S.C. section 360bbb-3(b)(1), unless the authorization is terminated or revoked.  Performed at [facility], [place] [address], [place], [place] [address]    *Note: Due to a large number of results and/or encounters for the requested time period, some results have not been displayed. A complete set of results can be found in Results Review.    Coagulation Studies: No results for input(s): LABPROT, INR in the last 72 hours.  Urinalysis: No results for input(s): COLORURINE, LABSPEC, PHURINE, GLUCOSEU, HGBUR, BILIRUBINUR, KETONESUR, PROTEINUR, UROBILINOGEN, NITRITE, LEUKOCYTESUR in the last 72 hours.  Invalid input(s): APPERANCEUR     Imaging: DG Chest Port 1 View  Result Date: 04/16/2021 CLINICAL DATA:  Hypoxia. EXAM: PORTABLE CHEST 1 VIEW COMPARISON:  One-view chest x-ray 04/13/2021 FINDINGS: Heart is enlarged. Atherosclerotic calcifications are present at the aortic arch. Heart size exaggerated by low lung volumes. Left greater than right pleural effusions are present. Bibasilar airspace disease is again noted. Mild pulmonary vascular congestion is stable. Right-sided PICC line is stable. The tip is at the cavoatrial junction. IMPRESSION: 1. Stable cardiomegaly and mild pulmonary vascular congestion. 2. Left greater than right pleural effusions and bibasilar airspace disease. While this likely reflects atelectasis, infection is not excluded. 3. Stable right-sided PICC line terminates at the cavoatrial junction. Electronically Signed   By: San Morelle M.D.   On: 04/16/2021 16:25     Medications:   . fentaNYL infusion INTRAVENOUS 20 mcg/hr (04/17/21 1621)  . furosemide     . amiodarone  100 mg Oral Daily  . silver sulfADIAZINE   Topical Daily  . sodium chloride flush  10-40 mL Intracatheter Q12H   acetaminophen, antiseptic oral rinse, fentaNYL, glycopyrrolate **OR** glycopyrrolate, LORazepam **OR** LORazepam **OR** LORazepam, nitroGLYCERIN, ondansetron (ZOFRAN) IV, polyvinyl alcohol, sodium chloride flush  Assessment/ Plan:   Acute kidney injury in setting of stage IV chronic kidney disease baseline serum creatinine about 3 mg/dL.  Now admitted with volume overload that is somewhat responding to Lasix drip.  Unfortunately renal function does appear to be deteriorating.  In my opinion I do not believe that Mr. Genova is a candidate for long-term outpatient hemodialysis.  ANEMIA-check iron studies  MBD-we will check PTH   HTN/VOL-we will continue on IV Lasix drip  Hypothyroidism continue replacement therapy  Coronary artery disease continue atorvastatin aspirin  Atrial fibrillation anticoagulated and  amiodarone for rate control     LOS: [place] @TODAY @9 :24 AM

## 2021-04-19 ENCOUNTER — Encounter (HOSPITAL_COMMUNITY): Payer: Medicare PPO | Admitting: Cardiology

## 2021-04-19 DIAGNOSIS — R0902 Hypoxemia: Secondary | ICD-10-CM

## 2021-04-19 DIAGNOSIS — Z515 Encounter for palliative care: Secondary | ICD-10-CM

## 2021-04-19 DIAGNOSIS — Z789 Other specified health status: Secondary | ICD-10-CM

## 2021-04-19 DIAGNOSIS — R339 Retention of urine, unspecified: Secondary | ICD-10-CM

## 2021-04-19 DIAGNOSIS — N184 Chronic kidney disease, stage 4 (severe): Secondary | ICD-10-CM

## 2021-04-19 DIAGNOSIS — I5023 Acute on chronic systolic (congestive) heart failure: Secondary | ICD-10-CM | POA: Diagnosis not present

## 2021-04-19 DIAGNOSIS — Z66 Do not resuscitate: Secondary | ICD-10-CM

## 2021-04-19 DIAGNOSIS — L03115 Cellulitis of right lower limb: Secondary | ICD-10-CM | POA: Diagnosis not present

## 2021-04-19 DIAGNOSIS — R0602 Shortness of breath: Secondary | ICD-10-CM

## 2021-04-19 DIAGNOSIS — L03116 Cellulitis of left lower limb: Secondary | ICD-10-CM | POA: Diagnosis not present

## 2021-04-19 MED ORDER — MORPHINE SULFATE (CONCENTRATE) 10 MG /0.5 ML PO SOLN: 5.0000 mg | ORAL | 0 refills | Status: AC | PRN

## 2021-04-19 MED ORDER — BIOTENE DRY MOUTH MT LIQD
15.0000 mL | Freq: Two times a day (BID) | OROMUCOSAL | Status: DC
  Administered 2021-04-19: 15 mL via TOPICAL

## 2021-04-19 MED ORDER — LORAZEPAM 2 MG/ML PO CONC: 1.0000 mg | ORAL | 0 refills | Status: AC | PRN

## 2021-04-19 NOTE — Progress Notes (Signed)
Nutrition Brief Note  Chart reviewed. Pt now transitioning to comfort care.  No further nutrition interventions planned at this time.  Please re-consult as needed.   Anice Wilshire W, RD, LDN, CDCES Registered Dietitian II Certified Diabetes Care and Education Specialist Please refer to AMION for RD and/or RD on-call/weekend/after hours pager   

## 2021-04-19 NOTE — Progress Notes (Signed)
Chart reviewed.  Patient is transitioning to comfort/hospice care. Please call back if needed.

## 2021-04-19 NOTE — Progress Notes (Signed)
Manufacturing engineer [facility]) Hospital Liaison note.    Chart reviewed and eligibility confirmed for [facility]. Spoke with family to confirm interest and explain services. Family agreeable to transfer today. TOC aware.    [facility] will notify TOC when registration paperwork has been completed to arrange transport.   RN please call report to [phone].  Please be sure a signed [place] DNR form transports with the patient.  Thank you for the opportunity to participate in this patient's care.  Domenic Moras, BSN, RN [facility] Liaison (listed on [place] under Hospice[facility])    [phone] (24h on call)

## 2021-04-19 NOTE — Progress Notes (Signed)
Bolus of Fentanyl drip and increase in Fentanyl drip done by Tammi Klippel NP. Bolus at 1212 and increase in drip at 1316. Provider did notify me of changes to drip.

## 2021-04-19 NOTE — Plan of Care (Signed)
  Problem: Education: Goal: Ability to demonstrate management of disease process will improve Outcome: Adequate for Discharge Goal: Ability to verbalize understanding of medication therapies will improve Outcome: Adequate for Discharge Goal: Individualized Educational Video(s) Outcome: Adequate for Discharge   Problem: Activity: Goal: Capacity to carry out activities will improve Outcome: Adequate for Discharge   Problem: Cardiac: Goal: Ability to achieve and maintain adequate cardiopulmonary perfusion will improve Outcome: Adequate for Discharge   Problem: Inadequate Intake (NI-2.1) Goal: Food and/or nutrient delivery Description: Individualized approach for food/nutrient provision. Outcome: Adequate for Discharge

## 2021-04-19 NOTE — Progress Notes (Signed)
Patient ID: Andrew [name], male   DOB: 06-05-1928, 87 y.o.   MRN: [id number]  Patient sleeping this morning, appears comfortable.  DNR with comfort care.  Planning transition to [facility] it sounds like.    Loralie Champagne 04/19/2021 8:50 AM

## 2021-04-19 NOTE — Progress Notes (Signed)
Daily Progress Note   Patient Name: Andrew [name]       Date: 04/19/2021 DOB: 12/23/1928  Age: 87 y.o. MRN#: [id number] Attending Physician: Little Ishikawa, MD Primary Care Physician: [name], MD Admit Date: 04/13/2021  Reason for Consultation/Follow-up: Disposition, Non pain symptom management, Pain control, Psychosocial/spiritual support and Terminal Care  Subjective: Chart review performed.  Received report from primary RN -no acute concerns.  RN reports patient is not eating or drinking and only excepting mouth care.   Went to visit patient at bedside -no family/visitors present.  Patient was lying in bed asleep -he does wake to voice/gentle touch but falls asleep intermittently during conversation.  Patient is able to tell me his name but no other orientation questions.  He denies pain and and says "yes" when asked if he is comfortable today.  His nasal cannula had been removed from his nose and patient endorses feeling short of breath.  Replaced nasal cannula for comfort and provided 20 mcg fentanyl bolus.  Noted patient was on 7 L O2 nasal cannula, decrease this to 6 L.   Discussed with RN plan to wean patient to room air and not to escalate/titrate oxygen based on saturations.   Length of Stay: 6  Current Medications: Scheduled Meds:  . silver sulfADIAZINE   Topical Daily  . sodium chloride flush  10-40 mL Intracatheter Q12H    Continuous Infusions: . fentaNYL infusion INTRAVENOUS 20 mcg/hr (04/17/21 1621)  . furosemide 160 mg (04/19/21 0813)    PRN Meds: acetaminophen, antiseptic oral rinse, fentaNYL, glycopyrrolate **OR** glycopyrrolate, LORazepam **OR** LORazepam **OR** LORazepam, nitroGLYCERIN, ondansetron (ZOFRAN) IV, polyvinyl alcohol, sodium chloride  flush  Physical Exam Vitals and nursing note reviewed.  Constitutional:      General: He is not in acute distress.    Appearance: He is ill-appearing.  Pulmonary:     Effort: No respiratory distress.  Skin:    General: Skin is warm and dry.     Coloration: Skin is pale.  Neurological:     Mental Status: He is lethargic, disoriented and confused.     Motor: Weakness present.  Psychiatric:        Behavior: Behavior is cooperative.        Cognition and Memory: Cognition is impaired. Memory is impaired.  Vital Signs: BP 111/69 (BP Location: Left Arm)   Pulse 65   Temp 97.7 F (36.5 C) (Oral)   Resp 16   Ht 5\' 7"  (1.702 m)   Wt 84.4 kg   SpO2 91%   BMI 29.14 kg/m  SpO2: SpO2: 91 % O2 Device: O2 Device: Nasal Cannula O2 Flow Rate: O2 Flow Rate (L/min): 6 L/min  Intake/output summary:   Intake/Output Summary (Last 24 hours) at 04/19/2021 1147 Last data filed at 04/19/2021 0854 Gross per 24 hour  Intake 111.45 ml  Output 1075 ml  Net -963.55 ml   LBM: Last BM Date: 04/17/21 Baseline Weight: Weight: 77.1 kg Most recent weight: Weight: 84.4 kg       Palliative Assessment/Data: PPS 10%      Patient Active Problem List   Diagnosis Date Noted  . Acute on chronic systolic CHF (congestive heart failure) ([place]) 04/13/2021  . Acute on chronic systolic congestive heart failure ([place])   . Cellulitis of left lower extremity   . Acute respiratory failure with hypoxia ([place]) 02/20/2021  . COPD (chronic obstructive pulmonary disease) ([place]) 02/20/2021  . History of CVA (cerebrovascular accident)   . Acute respiratory failure with hypercapnia ([place]) 06/02/2020  . Acute metabolic encephalopathy 60/45/4098  . CKD (chronic kidney disease), stage IV ([place]) 06/02/2020  . PAF (paroxysmal atrial fibrillation) ([place]) 06/02/2020  . Hypothyroidism 06/02/2020  . Chronic anticoagulation 06/02/2020  . Debility 06/01/2020  . Lumbar spondylosis 10/04/2018  . Chronic respiratory  failure with hypoxia ([place]) 09/16/2018  . Pain of right thumb 06/19/2018  . Bilateral recurrent inguinal hernias 11/28/2017  . Carotid artery stenosis 01/12/2016  . Chronic systolic CHF (congestive heart failure) ([place]) 12/02/2015  . Acute on chronic combined systolic and diastolic CHF (congestive heart failure) ([place]) 11/11/2015  . 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 ([place]) 10/16/2015  . PVD (peripheral vascular disease) ([place])   . Hereditary and idiopathic peripheral neuropathy 10/02/2015  . Peripelvic (lymphatic) cyst   . Pernicious anemia 07/15/2015  . Bilateral renal cysts 07/12/2015  . Lung nodule, 64mm RML CT 07/12/15 07/12/2015  . Chronic kidney disease, stage 3b ([place]) 06/14/2015  . Paroxysmal atrial fibrillation ([place]) 05/28/2015  . AAA (abdominal aortic aneurysm) without rupture ([place]) 09/30/2014  . Cardiomyopathy, ischemic 05/23/2014  . DM (diabetes mellitus), type 2 with renal complications ([place]) 11/91/4782  . CAD (coronary artery disease) 11/09/2010  . Depression, major, single episode, complete remission ([place]) 03/18/2010  . Hypothyroidism 02/03/2009  . Osteoarthritis 05/27/2008  . Hyperlipidemia associated with type 2 diabetes mellitus ([place]) 06/14/2007  . Hypertension associated with diabetes ([place]) 06/14/2007  . GERD 06/14/2007    Palliative Care Assessment & Plan   Patient Profile: 87 y.o.malewith past medical history of CKD stage IIIb, CAD s/p CABG, ischemic cardiomyopathy with primarily diastolic CHF, atrial fibrillation on eliquis, and PAD. He presented to the emergency departmenton 4/19/2022with abdominal pain.His wife also reported worsening of his leg wounds. CT of abdomen and pelvis revealing cholelithiasis without acute inflammation. Chest x-ray concerning for CHF. Found to have elevated troponin and BNP. Admitted to [facility] for sepsis secondary to BLE cellulitis, AKI on CKD, and acute on chronic combined HF.   Per cardiology,patient is not a candidate  for HD given age and cardiomyopathy.  Assessment: Acute on chronic systolic CHF Sepsis secondary to cellulitis of BLE AKI on CKD 3b Elevated troponin Acute on chronic hypoxic respiratory failure Acute metabolic encephalopathy Paroxysmal atrial fibrillation  Recommendations/Plan: Continue full comfort measures Continue DNR/DNI as previously documented  Per notes family were interested in [facility] - patient seems stable for transfer today -TOC and [place] liaison notified; TOC consult placed Wean to room air - manage supplemental oxygen per order Fentanyl order modified to include dose range of 20 mcg to 100 mcg - basal rate can be increased per order as oxygen is weaned for symptom management Continue palliative wound care Continue Foley catheter for end-of-life comfort Continue IV Lasix for now - deferring to cardiology Nursing to provide frequent assessments and administer PRN medications as clinically necessary to ensure end-of-life comfort PMT will continue to follow and support holistically  Goals of Care and Additional Recommendations: Limitations on Scope of Treatment: Full Comfort Care  Code Status:    Code Status Orders  (From admission, onward)         Start     Ordered   04/17/21 1512  Do not attempt resuscitation (DNR)  Continuous       Question Answer Comment  In the event of cardiac or respiratory ARREST Do not call a "code blue"   In the event of cardiac or respiratory ARREST Do not perform Intubation, CPR, defibrillation or ACLS   In the event of cardiac or respiratory ARREST Use medication by any route, position, wound care, and other measures to relive pain and suffering. May use oxygen, suction and manual treatment of airway obstruction as needed for comfort.      04/17/21 1531        Code Status History    Date Active Date Inactive Code Status Order ID Comments User Context   04/16/2021 1539 04/17/2021 1531 DNR [id number]  Little Ishikawa, MD  Inpatient   04/13/2021 1513 04/16/2021 1539 Full Code [id number]  Jonnie Finner, DO Inpatient   02/20/2021 0540 02/24/2021 2142 Full Code [id number]  Vianne Bulls, MD ED   06/02/2020 1448 06/12/2020 1948 Full Code [id number]  Norval Morton, MD ED   07/02/2019 0808 07/04/2019 1812 Full Code [id number]  Norval Morton, MD ED   09/16/2018 2214 09/17/2018 0236 Full Code [id number]  Etta Quill, DO ED   11/28/2017 1239 11/29/2017 2232 Full Code [id number]  Donnie Mesa, MD Inpatient   11/11/2015 1203 11/12/2015 1603 Full Code [id number]  Conrad Skykomish, NP Inpatient   10/17/2015 0031 10/18/2015 2000 Full Code [id number]  Theressa Millard, MD Inpatient   07/06/2015 1334 07/12/2015 1854 Full Code [id number]  Velvet Bathe, MD Inpatient   10/30/2014 1352 10/30/2014 2217 Full Code [id number]  Larey Dresser, MD Inpatient   03/28/2013 1407 03/30/2013 2117 Full Code [id number]  Theodis Blaze, MD Inpatient   Advance Care Planning Activity    Advance Directive Documentation   Flowsheet Row Most Recent Value  Type of Advance Directive Living will  Pre-existing out of facility DNR order (yellow form or pink MOST form) --  "MOST" Form in Place? --      Prognosis:  Hours - Days as oxygen is weaned  Discharge Planning: Hospice facility  Care plan was discussed with primary RN, Dr. Avon Gully, [facility], hospice liaison  Thank you for allowing the Palliative Medicine Team to assist in the care of this patient.   Total Time  15 minutes Prolonged Time Billed  no       Greater than 50%  of this time was spent counseling and coordinating care related to the above assessment and plan.  Lin Landsman, NP  Please contact Palliative Medicine Team phone at [phone] for questions and concerns.   *  Portions of this note are a verbal dictation therefore any spelling and/or grammatical errors are due to the "[place] [employer]" system interpretation.

## 2021-04-19 NOTE — Progress Notes (Signed)
D/C instructions printed and placed in packet at nurse's station. Primary RN requested IV be left in at this time.

## 2021-04-19 NOTE — Progress Notes (Signed)
PROGRESS NOTE    [place] [name]  QZR:[id number] DOB: 1928/11/10 DOA: 04/13/2021 PCP: [name], MD   Brief Narrative:  [place] [name] is a 87 y.o. male with medical history significant of combind HF, a fib on eliquis, hypothyroidism, HLD, PAD. Presenting with abdominal pain. History per wife as patient is a poor historian. Patient was originally seen this morning for abdominal pain. His wife reports that he's had RUQ ab pain for several months. It comes in waves. He's not seen a physician about it. He just dealt with it. It has apparently become worse over the last couple of days. The wife also noted that the patient has had a worsening of his leg wounds since he stopped going to the wound care center. He hasn't been to the center in at least 3 weeks. She says during that time, his legs have become more red and painful. He is not able to walk on his own at this point. She states that he stopped going just because he was tired of the hassle. She denies any other aggravating or alleviating factors. In ED: CT of abdomen and pelvis was revealing for cholelithiasis w/o acute inflammation. XR BLE was not concerning for osteo. CXR was concerning for CHF. He was found to have elevated tropinins and BNP. He was given vanc. Cardiology was consulted. [facility] was called for admission.  Assessment & Plan:   Active Problems:   Acute on chronic systolic CHF (congestive heart failure) (HCC)   Acute on chronic systolic congestive heart failure (HCC)   Cellulitis of left lower extremity  Goals of care discussion -Patient has been made comfort measures after lengthy discussion with family previously, palliative care assisting -appreciate insight and recommendations -Continue fentanyl drip for comfort, will discontinue all other labs, unnecessary medications or interventions. -Possible disposition to hospice house which was preferred per previous discussion with wife and family  Acute on chronic combined  HF Elevated troponin in setting of sepsis -No further invasive procedures or medications, continue comfort measures as above  AKI on CKD3b -No further invasive procedures or medications, continue comfort measures as above  Acute on chronic hypoxic respiratory failure on 3L [place] -No further invasive procedures or medications, continue comfort measures as above  Sepsis secondary to BLE cellulitis, POA resolving -No further invasive procedures or medications, continue comfort measures as above  Acute metabolic encephalopathy, multifactorial  - In the setting of above  Hypothyroidism -Hold any further nonsustained medications  PAF on eliquis -No further invasive procedures or medications, continue comfort measures as above -Certainly rate control medications could be of use if patient becomes symptomatic from A. fib but at this time appears stable  Hx of HTN Hx of PAD, HLD  Code Status: DNR, transition to comfort measures Family Communication: Wife at bedside, over the phone  Status is: Inpatient  Dispo: The patient is from: Home              Anticipated d/c is to: To be determined              Anticipated d/c date is: 24-48h              Patient currently is medically stable for discharge to hospice care at home vs hospice house pending further discussion  Consultants:   Cardiology, nephrology  Procedures:   None  Antimicrobials:  Vancomycin discontinued Ceftriaxone discontinued  Subjective: No issues or events overnight, tolerating fentanyl drip quite well appears very comfortable  Objective: Vitals:  04/18/21 0800 04/18/21 1221 04/18/21 1700 04/18/21 1920  BP:  117/73  137/81  Pulse: (!) 56 (!) 55 64 72  Resp: 10 12 (!) 7 16  Temp:    (!) 97.4 F (36.3 C)  TempSrc:    Axillary  SpO2: 96% 96% (!) 88% 92%  Weight:      Height:        Intake/Output Summary (Last 24 hours) at 04/19/2021 0754 Last data filed at 04/19/2021 0300 Gross per 24 hour  Intake  111.45 ml  Output 1000 ml  Net -888.55 ml   Filed Weights   04/15/21 0400 04/16/21 0500 04/17/21 0327  Weight: 81.2 kg 84.1 kg 84.4 kg    Examination:  General exam: Appears calm and comfortable; somnolent but easily arousable alert to person and general situation only Respiratory system: Clear to auscultation. Respiratory effort normal. Cardiovascular system: S1 & S2 heard, RRR. No JVD, murmurs, rubs, gallops or clicks. No pedal edema. Gastrointestinal system: Abdomen is nondistended, soft and nontender. No organomegaly or masses felt. Normal bowel sounds heard.  Foley outputting dark orange urine, questionable bloody sediment in Foley tube Central nervous system: Without focal neurodeficit. Skin: No rashes   Data Reviewed: I have personally reviewed following labs and imaging studies  CBC: Recent Labs  Lab 04/13/21 0124 04/14/21 0455 04/15/21 0546 04/16/21 0348 04/17/21 0318  WBC 14.6* 16.7* 18.9* 19.5* 15.4*  NEUTROABS 12.5* 14.1*  --   --   --   HGB 11.4* 10.2* 9.6* 9.1* 9.4*  HCT 36.6* 33.0* 31.2* 29.0* 29.8*  MCV 95.1 95.7 94.8 93.5 92.8  PLT 203 170 173 156 008   Basic Metabolic Panel: Recent Labs  Lab 04/13/21 2332 04/14/21 0455 04/15/21 0500 04/16/21 0348 04/17/21 0318  NA 137 139 136 132* 130*  K 4.3 4.3 4.3 4.1 4.3  CL 104 104 103 99 98  CO2 22 22 24 23 22   GLUCOSE 94 98 142* 163* 139*  BUN 74* 81* 89* 98* 106*  CREATININE 2.82* 2.91* 3.17* 3.39* 3.42*  CALCIUM 8.4* 8.5* 8.3* 8.0* 8.2*  MG  --   --   --   --  2.5*   GFR: Estimated Creatinine Clearance: 14.9 mL/min (A) (by C-G formula based on SCr of 3.42 mg/dL (H)). Liver Function Tests: Recent Labs  Lab 04/13/21 0124 04/14/21 0455  AST 40 101*  ALT 33 76*  ALKPHOS 92 77  BILITOT 1.1 1.1  PROT 6.8 5.8*  ALBUMIN 3.3* 2.7*   Recent Labs  Lab 04/13/21 0124  LIPASE 58*   No results for input(s): AMMONIA in the last 168 hours. Coagulation Profile: No results for input(s): INR, PROTIME  in the last 168 hours. Cardiac Enzymes: No results for input(s): CKTOTAL, CKMB, CKMBINDEX, TROPONINI in the last 168 hours. BNP (last 3 results) No results for input(s): PROBNP in the last 8760 hours. HbA1C: No results for input(s): HGBA1C in the last 72 hours. CBG: Recent Labs  Lab 04/16/21 1648 04/16/21 2046 04/17/21 0600 04/17/21 0736 04/17/21 1126  GLUCAP 171* 176* 142* 146* 191*   Lipid Profile: No results for input(s): CHOL, HDL, LDLCALC, TRIG, CHOLHDL, LDLDIRECT in the last 72 hours. Thyroid Function Tests: No results for input(s): TSH, T4TOTAL, FREET4, T3FREE, THYROIDAB in the last 72 hours. Anemia Panel: No results for input(s): VITAMINB12, FOLATE, FERRITIN, TIBC, IRON, RETICCTPCT in the last 72 hours. Sepsis Labs: Recent Labs  Lab 04/14/21 1355  PROCALCITON 3.61    Recent Results (from the past 240 hour(s))  Blood culture (  routine x 2)     Status: None   Collection Time: 04/13/21 10:41 AM   Specimen: Left Antecubital; Blood  Result Value Ref Range Status   Specimen Description   Final    LEFT ANTECUBITAL Performed at [facility] [address] [place], [place] [address]    Special Requests   Final    BOTTLES DRAWN AEROBIC AND ANAEROBIC Blood Culture adequate volume Performed at [facility] [name] [place], [place] [address]    Culture   Final    NO GROWTH 5 DAYS Performed at [facility], [place] [address] [place], [place] [address]    Report Status 04/18/2021 FINAL  Final  Blood culture (routine x 2)     Status: None   Collection Time: 04/13/21 10:51 AM   Specimen: Right Antecubital; Blood  Result Value Ref Range Status   Specimen Description   Final    RIGHT ANTECUBITAL Performed at [facility] [address] [place], [place] [address]    Special Requests   Final    BOTTLES DRAWN AEROBIC AND ANAEROBIC Blood Culture adequate volume Performed at [facility] [address], [place], [place] [address]    Culture   Final    NO GROWTH 5 DAYS Performed at [facility], [place] [address] [place], [place] [address]    Report Status 04/18/2021 FINAL  Final  Resp Panel by RT-PCR (Flu A&B, Covid) Nasopharyngeal Swab     Status: None   Collection Time: 04/13/21 10:59 AM   Specimen: Nasopharyngeal Swab; Nasopharyngeal(NP) swabs in vial transport medium  Result Value Ref Range Status   SARS Coronavirus 2 by RT PCR NEGATIVE NEGATIVE Final    Comment: (NOTE) SARS-CoV-2 target nucleic acids are NOT DETECTED.  The SARS-CoV-2 RNA is generally detectable in upper respiratory specimens during the acute phase of infection. The lowest concentration of SARS-CoV-2 viral copies this assay can detect is 138 copies/mL. A negative result does not preclude SARS-Cov-2 infection and should not be used as the sole basis for treatment or other patient management decisions. A negative result may occur with  improper specimen collection/handling, submission of specimen other than nasopharyngeal swab, presence of viral mutation(s) within the areas targeted by this assay, and inadequate number of viral copies(<138 copies/mL). A negative result must be combined with clinical observations, patient history, and epidemiological information. The expected result is Negative.  Fact Sheet for Patients:  EntrepreneurPulse.com.au  Fact Sheet for Healthcare Providers:  IncredibleEmployment.be  This test is no t yet approved or cleared by the [place] FDA and  has been authorized for detection and/or diagnosis of SARS-CoV-2 by FDA under an Emergency Use Authorization (EUA). This EUA will remain  in effect (meaning this test can be used) for the duration of the COVID-19 declaration under Section 564(b)(1) of the Act, 21 U.S.C.section 360bbb-3(b)(1), unless the authorization is terminated  or revoked sooner.        Influenza A by PCR NEGATIVE NEGATIVE Final   Influenza B by PCR NEGATIVE NEGATIVE Final    Comment: (NOTE) The Xpert Xpress SARS-CoV-2/FLU/RSV plus assay is intended as an aid in the diagnosis of influenza from Nasopharyngeal swab specimens and should not be used as a sole basis for treatment. Nasal washings and aspirates are unacceptable for Xpert Xpress SARS-CoV-2/FLU/RSV testing.  Fact Sheet for Patients: EntrepreneurPulse.com.au  Fact Sheet for Healthcare Providers: IncredibleEmployment.be  This test is not yet approved or cleared by  the [employer] and has been authorized for detection and/or diagnosis of SARS-CoV-2 by FDA under an Emergency Use Authorization (EUA). This EUA will remain in effect (meaning this test can be used) for the duration of the COVID-19 declaration under Section 564(b)(1) of the Act, 21 U.S.C. section 360bbb-3(b)(1), unless the authorization is terminated or revoked.  Performed at [facility], [place] [address] [place], [place] [address]       Radiology Studies: No results found.  Scheduled Meds: . silver sulfADIAZINE   Topical Daily  . sodium chloride flush  10-40 mL Intracatheter Q12H   Continuous Infusions: . fentaNYL infusion INTRAVENOUS 20 mcg/hr (04/17/21 1621)  . furosemide 160 mg (04/19/21 0008)     LOS: 6 days   Time spent: 70min  Merle Whitehorn C Elinore Shults, DO [facility]  If 7PM-7AM, please contact night-coverage [email or web address]  04/19/2021, 7:54 AM

## 2021-04-19 NOTE — Progress Notes (Signed)
Pt being DC'd to facility and transported via EMS. Fentanyl gtt DC'd prior to transport. Remaining 69mL of fentanyl wasted with Loreli Slot, Therapist, sports.

## 2021-04-19 NOTE — TOC Transition Note (Addendum)
Transition of Care [facility]) - CM/SW Discharge Note Heart Failure  Patient Details  Name: [place] [name] MRN: [id number] Date of Birth: Feb 22, 1928  [facility]) CM/SW Contact:  [place], [place] Phone Number: 04/19/2021, 3:44 PM   Clinical Narrative:    Patient will DC to: [facility] Anticipated DC date: 04/19/2021 Family notified: Spouse Tarius Stangelo [phone] Transport by: Corey Harold   Per MD patient ready for DC to [facility]. RN to call report prior to discharge [phone]). RN, patient, patient's family, and facility notified of DC.  DC packet on chart. DNR on DC packet and MD made aware to sign DNR and scripts. MD has signed DNR and scripts. CSW awaiting for hospice forms to be signed before contacting Ambulance transport request for the patient. CSW was informed the paperwork has been signed and CSW called [facility] and they will transport patient as soon as possible.  CSW will sign off for now as social work intervention is no longer needed. Please consult [place] again if new needs arise.      Final next level of care: [place] Barriers to Discharge: No Barriers Identified   Patient Goals and CMS Choice        Discharge Placement              Patient chooses bed at:  [facility]) Patient to be transferred to facility by: [place] Name of family member notified: Erwin Nishiyama Patient and family notified of of transfer: 04/19/21  Discharge Plan and Services                                     Social Determinants of Health (SDOH) Interventions     Readmission Risk Interventions Readmission Risk Prevention Plan 06/08/2020 07/04/2019  Transportation Screening Complete Complete  PCP or Specialist Appt within 3-5 Days Not Complete Complete  Not Complete comments disposition pending- likely SNF -  [place] or [place] Complete Complete  Social Work Consult for [place] Planning/Counseling Complete Complete  Palliative  Care Screening Not Applicable Complete  Medication Review Press photographer) Referral to Pharmacy Complete  Some recent data might be hidden    [place], MSW, [place] Heart Failure Social Worker

## 2021-04-19 NOTE — Discharge Summary (Signed)
Physician Discharge Summary  BUSTER SCHUELLER HUT:[id number] DOB: June 30, 1928 DOA: 04/13/2021  PCP: [name], MD  Admit date: 04/13/2021 Discharge date: 04/19/2021  Admitted From: Home Disposition: Hospice  Brief/Interim Summary: Andrew [name] a 87 y.o.malewith medical history significant ofcombind HF, a fib on eliquis, hypothyroidism, HLD, PAD. Presenting with abdominal pain. History per wife as patient is a poor historian. Patient was originally seen this morning for abdominal pain. His wife reports that he's had RUQ ab pain for several months. It comes in waves. He's not seen a physician about it. He just dealt with it. It has apparently become worse over the last couple of days. The wife also noted that the patient has had a worsening of his leg wounds since he stopped going to the wound care center. He hasn't been to the center in at least 3 weeks. She says during that time, his legs have become more red and painful. He is not able to walk on his own at this point. She states that he stopped going just because he was tired of the hassle. She denies any other aggravating or alleviating factors.In ED:CT of abdomen and pelvis was revealing for cholelithiasis w/o acute inflammation. XR BLE was not concerning for osteo. CXR was concerning for CHF. He was found to have elevated tropinins and BNP. He was given vanc. Cardiology was consulted. [facility] was called for admission.  Patient has been made comfort measures after lengthy discussion with family previously, palliative care assisting -appreciate insight and recommendations.  Will discharge on sublingual lorazepam and morphine for comfort measures until patient arrives to facility.  Otherwise family patient agreeable for discharge to hospice house given worsening kidney function urine output and overall decline in patient's health.  Discharge Diagnoses:  Active Problems:   Acute on chronic systolic CHF (congestive heart failure) (HCC)    Acute on chronic systolic congestive heart failure (HCC)   Cellulitis of left lower extremity    Discharge Instructions  Discharge Instructions    No wound care   Complete by: As directed      Allergies as of 04/19/2021      Reactions   Metoprolol Shortness Of Breath, Palpitations, Other (See Comments)   Heart starts racing. Shallow breathing; pt states he also get skin irritation on his legs   Metformin Hives, Swelling   On legs   Metformin And Related Nausea And Vomiting      Medication List    STOP taking these medications   acetaminophen 500 MG tablet Commonly known as: TYLENOL   amiodarone 200 MG tablet Commonly known as: PACERONE   atorvastatin 80 MG tablet Commonly known as: LIPITOR   carvedilol 6.25 MG tablet Commonly known as: COREG   Eliquis 2.5 MG Tabs tablet Generic drug: apixaban   empagliflozin 10 MG Tabs tablet Commonly known as: Jardiance   ezetimibe 10 MG tablet Commonly known as: ZETIA   FLUoxetine 20 MG capsule Commonly known as: PROZAC   gabapentin 100 MG capsule Commonly known as: NEURONTIN   hydrALAZINE 50 MG tablet Commonly known as: APRESOLINE   isosorbide mononitrate 30 MG 24 hr tablet Commonly known as: IMDUR   levothyroxine 100 MCG tablet Commonly known as: SYNTHROID   metolazone 2.5 MG tablet Commonly known as: ZAROXOLYN   Multivitamin Adults 50+ Tabs   nitroGLYCERIN 0.4 MG SL tablet Commonly known as: NITROSTAT   pantoprazole 40 MG tablet Commonly known as: PROTONIX   potassium chloride SA 20 MEQ tablet Commonly known as: KLOR-CON  tiotropium 18 MCG inhalation capsule Commonly known as: SPIRIVA   torsemide 20 MG tablet Commonly known as: DEMADEX     TAKE these medications   LORazepam 2 MG/ML concentrated solution Commonly known as: ATIVAN Place 0.5 mLs (1 mg total) under the tongue every 4 (four) hours as needed for anxiety.   morphine CONCENTRATE 10 mg / 0.5 ml concentrated solution Place 0.25 mLs (5  mg total) under the tongue every 3 (three) hours as needed for up to 5 days for severe pain (End of life/comfort).       Allergies  Allergen Reactions  . Metoprolol Shortness Of Breath, Palpitations and Other (See Comments)    Heart starts racing. Shallow breathing; pt states he also get skin irritation on his legs  . Metformin Hives and Swelling    On legs  . Metformin And Related Nausea And Vomiting    Consultations:  Nephrology, cardiology, palliative care   Procedures/Studies: CT ABDOMEN PELVIS WO CONTRAST  Result Date: 04/13/2021 CLINICAL DATA:  Nausea, vomiting and diarrhea EXAM: CT ABDOMEN AND PELVIS WITHOUT CONTRAST TECHNIQUE: Multidetector CT imaging of the abdomen and pelvis was performed following the standard protocol without IV contrast. COMPARISON:  05/31/2019 FINDINGS: LOWER CHEST: Small pleural effusions. HEPATOBILIARY: Normal hepatic contours. No intra- or extrahepatic biliary dilatation. There is cholelithiasis without acute inflammation. Small volume ascites in the upper abdomen. PANCREAS: Normal pancreas. No ductal dilatation or peripancreatic fluid collection. SPLEEN: Normal. ADRENALS/URINARY TRACT: The adrenal glands are normal. Multiple unchanged renal cysts. The largest on the right measures 4.2 cm in the largest on the left measures 6.6 cm. The urinary bladder is normal for degree of distention STOMACH/BOWEL: There is no hiatal hernia. Normal duodenal course and caliber. No small bowel dilatation or inflammation. No focal colonic abnormality. Normal appendix. VASCULAR/LYMPHATIC: There is calcific atherosclerosis of the abdominal aorta. Remote aortic graft repair. No lymphadenopathy. REPRODUCTIVE: There are calcifications within the normal-sized prostate. Symmetric seminal vesicles. MUSCULOSKELETAL. No bony spinal canal stenosis or focal osseous abnormality. OTHER: None. IMPRESSION: 1. No acute abnormality of the abdomen or pelvis. 2. Small volume ascites and small  pleural effusions. 3. Cholelithiasis without acute inflammation. Electronically Signed   By: Ulyses Jarred M.D.   On: 04/13/2021 03:37   DG Tibia/Fibula Left  Result Date: 04/13/2021 CLINICAL DATA:  Leg ulcer EXAM: LEFT TIBIA AND FIBULA - 2 VIEW COMPARISON:  None. FINDINGS: There is no evidence of fracture or other focal bone lesions. No focal erosion or periosteal elevation. Extensive vascular calcifications. Mild diffuse soft tissue swelling. IMPRESSION: 1. No acute osseous abnormality in the left tibia or fibula. 2. Mild diffuse soft tissue swelling. 3. Extensive vascular calcifications. Electronically Signed   By: Davina Poke D.O.   On: 04/13/2021 11:04   DG Tibia/Fibula Right  Result Date: 04/13/2021 CLINICAL DATA:  Leg ulcers.  Evaluate for osteomyelitis. EXAM: RIGHT TIBIA AND FIBULA - 2 VIEW COMPARISON:  Right knee radiographs 07/03/2020. Right tibia/fibula radiographs 09/15/2015. FINDINGS: No acute fracture is identified. The knee and ankle appear located. No interval osseous erosion is seen to indicate osteomyelitis. There is mild medial compartment joint space narrowing at the knee. Patellar and calcaneal enthesophytes are noted. There are surgical clips in the lower leg with atherosclerotic vascular calcifications and soft tissue swelling. IMPRESSION: No evidence of osteomyelitis. Electronically Signed   By: Logan Bores M.D.   On: 04/13/2021 11:03   US RENAL  Result Date: 04/15/2021 CLINICAL DATA:  Urinary retention EXAM: RENAL / URINARY TRACT ULTRASOUND COMPLETE COMPARISON:  04/13/2021 FINDINGS: Right Kidney: Renal measurements: 13.8 x 7.5 x 5.4 cm. = volume: 296 mL. Increased echogenicity is noted. Multiple cysts are noted the largest of which measures 4.5 cm in the lower pole of the right kidney. These changes are similar to that seen on the prior CT examination. No mass lesion or hydronephrosis is noted. Left Kidney: Renal measurements: 13.4 x 6.6 x 6.5 cm. = volume: 302 mL. Mild  increased echogenicity is noted. Cysts are noted within the left kidney also similar to that seen on the prior CT examination. The largest of these measures 6.9 cm in greatest dimension. No obstructive changes are seen. Bladder: Partially distended. Other: None. IMPRESSION: Bilateral renal cysts similar to that seen on the prior CT examination. Mild increased echogenicity consistent with medical renal disease. No obstructive changes are noted. Electronically Signed   By: Inez Catalina M.D.   On: 04/15/2021 19:19   DG Chest Port 1 View  Result Date: 04/16/2021 CLINICAL DATA:  Hypoxia. EXAM: PORTABLE CHEST 1 VIEW COMPARISON:  One-view chest x-ray 04/13/2021 FINDINGS: Heart is enlarged. Atherosclerotic calcifications are present at the aortic arch. Heart size exaggerated by low lung volumes. Left greater than right pleural effusions are present. Bibasilar airspace disease is again noted. Mild pulmonary vascular congestion is stable. Right-sided PICC line is stable. The tip is at the cavoatrial junction. IMPRESSION: 1. Stable cardiomegaly and mild pulmonary vascular congestion. 2. Left greater than right pleural effusions and bibasilar airspace disease. While this likely reflects atelectasis, infection is not excluded. 3. Stable right-sided PICC line terminates at the cavoatrial junction. Electronically Signed   By: San Morelle M.D.   On: 04/16/2021 16:25   DG CHEST PORT 1 VIEW  Result Date: 04/13/2021 CLINICAL DATA:  PICC line placement EXAM: PORTABLE CHEST 1 VIEW COMPARISON:  04/13/2021, 02/20/2021 CT 07/03/2019 FINDINGS: Post sternotomy changes. Interim placement of right upper extremity central venous catheter, difficult visualization of the tip due to overlying support leads. Tip appears to be over the cavoatrial region. Cardiomegaly with vascular congestion and small pleural effusions, not much interval change. Aortic atherosclerosis. No pneumothorax. IMPRESSION: Slightly difficult visualization of  right upper extremity central venous catheter tip, it appears to project over the cavoatrial region. Continued cardiomegaly, vascular congestion, and small pleural effusions. Electronically Signed   By: Donavan Foil M.D.   On: 04/13/2021 21:28   DG Chest Port 1 View  Result Date: 04/13/2021 CLINICAL DATA:  Hypoxia. EXAM: PORTABLE CHEST 1 VIEW COMPARISON:  02/20/2021. FINDINGS: Prior CABG. Cardiomegaly. Mild bilateral interstitial prominence and small bilateral pleural effusions. Findings suggest CHF. Mild bibasilar atelectasis. No pneumothorax. IMPRESSION: 1. Prior CABG. Cardiomegaly with mild bilateral interstitial prominence and small bilateral pleural effusions. Findings suggest CHF. 2.  Low lung volumes with bibasilar atelectasis. Electronically Signed   By: Marcello Moores  Register   On: 04/13/2021 11:05   ECHOCARDIOGRAM COMPLETE  Result Date: 04/13/2021    ECHOCARDIOGRAM REPORT   Patient Name:   KRISHAV MAMONE Date of Exam: 04/13/2021 Medical Rec #:  [id number]        Height:       67.0 in Accession #:    [id number]       Weight:       170.0 lb Date of Birth:  Mar 10, 1928        BSA:          1.887 m Patient Age:    89 years         BP:  101/76 mmHg Patient Gender: M                HR:           67 bpm. Exam Location:  Inpatient Procedure: 2D Echo, Cardiac Doppler, Color Doppler and Intracardiac            Opacification Agent Indications:    CHF-Acute Systolic Y09.98  History:        Patient has prior history of Echocardiogram examinations, most                 recent 02/21/2021. CHF, CAD, COPD, Arrythmias:Atrial Fibrillation                 and Atrial Flutter, Signs/Symptoms:Shortness of Breath and                 Dyspnea; Risk Factors:Hypertension, Dyslipidemia and Diabetes.  Sonographer:    Bernadene Person RDCS Referring Phys: [id number] RHONDA G BARRETT  Sonographer Comments: Image acquisition challenging due to respiratory motion. IMPRESSIONS  1. Left ventricular ejection fraction, by estimation, is 25  to 30%. The left ventricle has severely decreased function. The left ventricle demonstrates global hypokinesis with mild dyskinesis of the left ventricular apex and disproportionately severe inferior/inferoseptal/inferolaeral hypokinesis. There is relatively preserved anterior septal and basal lateral wall contractility. Left ventricular diastolic parameters are consistent with Grade II diastolic dysfunction (pseudonormalization). Elevated left atrial pressure.  2. Right ventricular systolic function is moderately reduced. The right ventricular size is normal. There is normal pulmonary artery systolic pressure.  3. Left atrial size was severely dilated.  4. The mitral valve is degenerative. Mild mitral valve regurgitation. No evidence of mitral stenosis. Severe mitral annular calcification.  5. The aortic valve is tricuspid. Aortic valve regurgitation is not visualized. Mild aortic valve sclerosis is present, with no evidence of aortic valve stenosis.  6. Aortic dilatation noted. There is borderline dilatation of the aortic root, measuring 38 mm.  7. The inferior vena cava is dilated in size with <50% respiratory variability, suggesting right atrial pressure of 15 mmHg. Comparison(s): No significant change from prior study. Prior images reviewed side by side. The rhythm appears to be sinus with very long PR interval and frequent PACs, versus periods of Mobitz type 1 second degree AV block. FINDINGS  Left Ventricle: There is no left ventricular thrombus with Definity. Left ventricular ejection fraction, by estimation, is 25 to 30%. The left ventricle has severely decreased function. The left ventricle demonstrates regional wall motion abnormalities.  Mild dyskinesis of the left ventricular, entire apical segment. Severe hypokinesis of the left ventricular, entire inferior wall, inferolateral wall and inferoseptal wall. Definity contrast agent was given IV to delineate the left ventricular endocardial borders. The  left ventricular internal cavity size was normal in size. There is no left ventricular hypertrophy. Abnormal (paradoxical) septal motion consistent with post-operative status. Left ventricular diastolic function could not be evaluated due to atrial fibrillation. Left ventricular diastolic parameters are consistent with Grade II diastolic dysfunction (pseudonormalization). Elevated left atrial pressure.  LV Wall Scoring: The entire apex is dyskinetic. The inferior wall, posterior wall, mid inferoseptal segment, and basal inferoseptal segment are hypokinetic. The anterior wall, antero-lateral wall, and anterior septum are normal. Right Ventricle: The right ventricular size is normal. No increase in right ventricular wall thickness. Right ventricular systolic function is moderately reduced. There is normal pulmonary artery systolic pressure. The tricuspid regurgitant velocity is 2.28 m/s, and with an assumed right atrial pressure of 15 mmHg, the estimated  right ventricular systolic pressure is 16.9 mmHg. Left Atrium: Left atrial size was severely dilated. Right Atrium: Right atrial size was normal in size. Pericardium: There is no evidence of pericardial effusion. Mitral Valve: The mitral valve is degenerative in appearance. Severe mitral annular calcification. Mild mitral valve regurgitation. No evidence of mitral valve stenosis. Tricuspid Valve: The tricuspid valve is normal in structure. Tricuspid valve regurgitation is trivial. Aortic Valve: The aortic valve is tricuspid. Aortic valve regurgitation is not visualized. Mild aortic valve sclerosis is present, with no evidence of aortic valve stenosis. Pulmonic Valve: The pulmonic valve was grossly normal. Pulmonic valve regurgitation is trivial. Aorta: Aortic dilatation noted. There is borderline dilatation of the aortic root, measuring 38 mm. Venous: The inferior vena cava is dilated in size with less than 50% respiratory variability, suggesting right atrial pressure  of 15 mmHg. IAS/Shunts: No atrial level shunt detected by color flow Doppler.  LEFT VENTRICLE PLAX 2D LVIDd:         4.30 cm LVIDs:         3.10 cm LV PW:         1.10 cm LV IVS:        1.00 cm LVOT diam:     2.00 cm LV SV:         35 LV SV Index:   18 LVOT Area:     3.14 cm  LV Volumes (MOD) LV vol d, MOD A2C: 143.0 ml LV vol d, MOD A4C: 151.0 ml LV vol s, MOD A2C: 117.0 ml LV vol s, MOD A4C: 97.1 ml LV SV MOD A2C:     26.0 ml LV SV MOD A4C:     151.0 ml LV SV MOD BP:      40.9 ml RIGHT VENTRICLE TAPSE (M-mode): 1.1 cm LEFT ATRIUM             Index       RIGHT ATRIUM           Index LA diam:        5.20 cm 2.76 cm/m  RA Area:     14.40 cm LA Vol (A2C):   75.8 ml 40.16 ml/m RA Volume:   32.00 ml  16.96 ml/m LA Vol (A4C):   76.4 ml 40.48 ml/m LA Biplane Vol: 80.8 ml 42.81 ml/m  AORTIC VALVE LVOT Vmax:   58.85 cm/s LVOT Vmean:  41.900 cm/s LVOT VTI:    0.111 m  AORTA Ao Root diam: 3.80 cm Ao Asc diam:  3.20 cm MITRAL VALVE            TRICUSPID VALVE MV Area (PHT): 5.04 cm TR Peak grad:   20.8 mmHg MV Decel Time: 151 msec TR Vmax:        228.00 cm/s                          SHUNTS                         Systemic VTI:  0.11 m                         Systemic Diam: 2.00 cm Dani Gobble Croitoru MD Electronically signed by Sanda Klein MD Signature Date/Time: 04/13/2021/3:00:16 PM    Final    LONG TERM MONITOR (3-14 DAYS)  Result Date: 04/05/2021 Patch Wear Time:  8 days and 2 hours (2022-03-22T12:33:54-0400 to 2022-03-30T14:55:42-0400) Patient  had a min HR of 36 bpm, max HR of 138 bpm, and avg HR of 56 bpm. Predominant underlying rhythm was Sinus Rhythm. First Degree AV Block was present. Bundle Branch Block/IVCD was present. 1 run of Ventricular Tachycardia occurred lasting 4 beats with a max rate of 138 bpm (avg 131 bpm). Isolated SVEs were rare (<1.0%), SVE Couplets were rare (<1.0%), and no SVE Triplets were present. Isolated VEs were rare (<1.0%), VE Couplets were rare (<1.0%), and no VE Triplets were  present. Ventricular Bigeminy and Trigeminy were present. Conclusion: 1. Predominantly NSR. Average 56 bpm, bradycardia seemed predominantly nocturnal. 2. Rare PVCs 3. 1 4 beat NSVT run.   [place] EKG SITE RITE  Result Date: 04/13/2021 If Site Rite image not attached, placement could not be confirmed due to current cardiac rhythm.  [place] EKG SITE RITE  Result Date: 04/13/2021 If Site Rite image not attached, placement could not be confirmed due to current cardiac rhythm.     Subjective: No acute issues or events overnight   Discharge Exam: Vitals:   04/19/21 0800 04/19/21 1122  BP:  111/69  Pulse: 66 65  Resp: 12 16  Temp:  97.7 F (36.5 C)  SpO2: 90% 91%   Vitals:   04/18/21 1700 04/18/21 1920 04/19/21 0800 04/19/21 1122  BP:  137/81  111/69  Pulse: 64 72 66 65  Resp: (!) 7 16 12 16   Temp:  (!) 97.4 F (36.3 C)  97.7 F (36.5 C)  TempSrc:  Axillary  Oral  SpO2: (!) 88% 92% 90% 91%  Weight:      Height:        General: Pt is alert, awake, not in acute distress Cardiovascular: RRR, S1/S2 +, no rubs, no gallops Respiratory: Bilateral cardiogenic wheeze, no rhonchi Abdominal: Soft, NT, ND, bowel sounds + Extremities: 2+ edema, no cyanosis    The results of significant diagnostics from this hospitalization (including imaging, microbiology, ancillary and laboratory) are listed below for reference.     Microbiology: Recent Results (from the past 240 hour(s))  Blood culture (routine x 2)     Status: None   Collection Time: 04/13/21 10:41 AM   Specimen: Left Antecubital; Blood  Result Value Ref Range Status   Specimen Description   Final    LEFT ANTECUBITAL Performed at [facility] [address], [place], [place] [address]    Special Requests   Final    BOTTLES DRAWN AEROBIC AND ANAEROBIC Blood Culture adequate volume Performed at [facility] [address], [place], [place] [address]    Culture   Final    NO GROWTH 5  DAYS Performed at [facility], [place] [address], [place], [place] [address]    Report Status 04/18/2021 FINAL  Final  Blood culture (routine x 2)     Status: None   Collection Time: 04/13/21 10:51 AM   Specimen: Right Antecubital; Blood  Result Value Ref Range Status   Specimen Description   Final    RIGHT ANTECUBITAL Performed at [facility] [address] [place], [place] [address]    Special Requests   Final    BOTTLES DRAWN AEROBIC AND ANAEROBIC Blood Culture adequate volume Performed at [facility] [address], [place],  [address]    Culture   Final    NO GROWTH 5 DAYS Performed at [facility], [place] [address] [place],  [address]    Report Status 04/18/2021 FINAL  Final  Resp Panel by RT-PCR (Flu A&B, Covid) Nasopharyngeal Swab     Status: None   Collection Time: 04/13/21 10:59 AM   Specimen: Nasopharyngeal Swab; Nasopharyngeal(NP) swabs in vial transport medium  Result Value Ref Range Status   SARS Coronavirus 2 by RT PCR NEGATIVE NEGATIVE Final    Comment: (NOTE) SARS-CoV-2 target nucleic acids are NOT DETECTED.  The SARS-CoV-2 RNA is generally detectable in upper respiratory specimens during the acute phase of infection. The lowest concentration of SARS-CoV-2 viral copies this assay can detect is 138 copies/mL. A negative result does not preclude SARS-Cov-2 infection and should not be used as the sole basis for treatment or other patient management decisions. A negative result may occur with  improper specimen collection/handling, submission of specimen other than nasopharyngeal swab, presence of viral mutation(s) within the areas targeted by this assay, and inadequate number of viral copies(<138 copies/mL). A negative result must be combined with clinical observations, patient history, and epidemiological information. The expected result is Negative.  Fact Sheet for Patients:   EntrepreneurPulse.com.au  Fact Sheet for Healthcare Providers:  IncredibleEmployment.be  This test is no t yet approved or cleared by the [place] FDA and  has been authorized for detection and/or diagnosis of SARS-CoV-2 by FDA under an Emergency Use Authorization (EUA). This EUA will remain  in effect (meaning this test can be used) for the duration of the COVID-19 declaration under Section 564(b)(1) of the Act, 21 U.S.C.section 360bbb-3(b)(1), unless the authorization is terminated  or revoked sooner.       Influenza A by PCR NEGATIVE NEGATIVE Final   Influenza B by PCR NEGATIVE NEGATIVE Final    Comment: (NOTE) The Xpert Xpress SARS-CoV-2/FLU/RSV plus assay is intended as an aid in the diagnosis of influenza from Nasopharyngeal swab specimens and should not be used as a sole basis for treatment. Nasal washings and aspirates are unacceptable for Xpert Xpress SARS-CoV-2/FLU/RSV testing.  Fact Sheet for Patients: EntrepreneurPulse.com.au  Fact Sheet for Healthcare Providers: IncredibleEmployment.be  This test is not yet approved or cleared by the [place] FDA and has been authorized for detection and/or diagnosis of SARS-CoV-2 by FDA under an Emergency Use Authorization (EUA). This EUA will remain in effect (meaning this test can be used) for the duration of the COVID-19 declaration under Section 564(b)(1) of the Act, 21 U.S.C. section 360bbb-3(b)(1), unless the authorization is terminated or revoked.  Performed at [facility], [place] [address] [place], [place] [address]      Labs: BNP (last 3 results) Recent Labs    06/03/20 0604 02/20/21 0313 04/13/21 1051  BNP 1,981.3* 1,833.3* >8,182.9*   Basic Metabolic Panel: Recent Labs  Lab 04/13/21 2332 04/14/21 0455 04/15/21 0500 04/16/21 0348 04/17/21 0318  NA 137 139 136 132* 130*  K 4.3 4.3 4.3 4.1 4.3  CL 104  104 103 99 98  CO2 22 22 24 23 22   GLUCOSE 94 98 142* 163* 139*  BUN 74* 81* 89* 98* 106*  CREATININE 2.82* 2.91* 3.17* 3.39* 3.42*  CALCIUM 8.4* 8.5* 8.3* 8.0* 8.2*  MG  --   --   --   --  2.5*   Liver Function Tests: Recent Labs  Lab 04/13/21 0124 04/14/21 0455  AST 40 101*  ALT 33 76*  ALKPHOS 92 77  BILITOT 1.1 1.1  PROT 6.8 5.8*  ALBUMIN 3.3* 2.7*   Recent Labs  Lab 04/13/21 0124  LIPASE 58*   No results for input(s): AMMONIA in the last  168 hours. CBC: Recent Labs  Lab 04/13/21 0124 04/14/21 0455 04/15/21 0546 04/16/21 0348 04/17/21 0318  WBC 14.6* 16.7* 18.9* 19.5* 15.4*  NEUTROABS 12.5* 14.1*  --   --   --   HGB 11.4* 10.2* 9.6* 9.1* 9.4*  HCT 36.6* 33.0* 31.2* 29.0* 29.8*  MCV 95.1 95.7 94.8 93.5 92.8  PLT 203 170 173 156 158   Cardiac Enzymes: No results for input(s): CKTOTAL, CKMB, CKMBINDEX, TROPONINI in the last 168 hours. BNP: Invalid input(s): POCBNP CBG: Recent Labs  Lab 04/16/21 1648 04/16/21 2046 04/17/21 0600 04/17/21 0736 04/17/21 1126  GLUCAP 171* 176* 142* 146* 191*   D-Dimer No results for input(s): DDIMER in the last 72 hours. Hgb A1c No results for input(s): HGBA1C in the last 72 hours. Lipid Profile No results for input(s): CHOL, HDL, LDLCALC, TRIG, CHOLHDL, LDLDIRECT in the last 72 hours. Thyroid function studies No results for input(s): TSH, T4TOTAL, T3FREE, THYROIDAB in the last 72 hours.  Invalid input(s): FREET3 Anemia work up No results for input(s): VITAMINB12, FOLATE, FERRITIN, TIBC, IRON, RETICCTPCT in the last 72 hours. Urinalysis    Component Value Date/Time   COLORURINE YELLOW 04/13/2021 [place] 04/13/2021 0257   LABSPEC 1.014 04/13/2021 0257   PHURINE 5.0 04/13/2021 0257   GLUCOSEU 50 (A) 04/13/2021 0257   GLUCOSEU NEGATIVE 01/13/2016 1342   HGBUR NEGATIVE 04/13/2021 0257   HGBUR negative 02/03/2009 0912   BILIRUBINUR NEGATIVE 04/13/2021 0257   BILIRUBINUR small 07/05/2015 1020    KETONESUR NEGATIVE 04/13/2021 0257   PROTEINUR 30 (A) 04/13/2021 0257   UROBILINOGEN 0.2 01/13/2016 1342   NITRITE NEGATIVE 04/13/2021 0257   LEUKOCYTESUR NEGATIVE 04/13/2021 0257   Sepsis Labs Invalid input(s): PROCALCITONIN,  WBC,  LACTICIDVEN Microbiology Recent Results (from the past 240 hour(s))  Blood culture (routine x 2)     Status: None   Collection Time: 04/13/21 10:41 AM   Specimen: Left Antecubital; Blood  Result Value Ref Range Status   Specimen Description   Final    LEFT ANTECUBITAL Performed at [facility], [place] [address], [place], [place] [address]    Special Requests   Final    BOTTLES DRAWN AEROBIC AND ANAEROBIC Blood Culture adequate volume Performed at [place] [address], [place], [place] [address]    Culture   Final    NO GROWTH 5 DAYS Performed at [facility], [place] [address] [place], [place] [address]    Report Status 04/18/2021 FINAL  Final  Blood culture (routine x 2)     Status: None   Collection Time: 04/13/21 10:51 AM   Specimen: Right Antecubital; Blood  Result Value Ref Range Status   Specimen Description   Final    RIGHT ANTECUBITAL Performed at [facility] [address], [place], [place] [address]    Special Requests   Final    BOTTLES DRAWN AEROBIC AND ANAEROBIC Blood Culture adequate volume Performed at [place] [place] [address] [place], [place] [address]    Culture   Final    NO GROWTH 5 DAYS Performed at [facility], [place] [address] [place],  [address]    Report Status 04/18/2021 FINAL  Final  Resp Panel by RT-PCR (Flu A&B, Covid) Nasopharyngeal Swab     Status: None   Collection Time: 04/13/21 10:59 AM   Specimen: Nasopharyngeal Swab; Nasopharyngeal(NP) swabs in vial transport medium  Result Value Ref Range Status   SARS  Coronavirus 2 by RT PCR NEGATIVE NEGATIVE Final    Comment: (NOTE) SARS-CoV-2 target nucleic  acids are NOT DETECTED.  The SARS-CoV-2 RNA is generally detectable in upper respiratory specimens during the acute phase of infection. The lowest concentration of SARS-CoV-2 viral copies this assay can detect is 138 copies/mL. A negative result does not preclude SARS-Cov-2 infection and should not be used as the sole basis for treatment or other patient management decisions. A negative result may occur with  improper specimen collection/handling, submission of specimen other than nasopharyngeal swab, presence of viral mutation(s) within the areas targeted by this assay, and inadequate number of viral copies(<138 copies/mL). A negative result must be combined with clinical observations, patient history, and epidemiological information. The expected result is Negative.  Fact Sheet for Patients:  EntrepreneurPulse.com.au  Fact Sheet for Healthcare Providers:  IncredibleEmployment.be  This test is no t yet approved or cleared by the [place] FDA and  has been authorized for detection and/or diagnosis of SARS-CoV-2 by FDA under an Emergency Use Authorization (EUA). This EUA will remain  in effect (meaning this test can be used) for the duration of the COVID-19 declaration under Section 564(b)(1) of the Act, 21 U.S.C.section 360bbb-3(b)(1), unless the authorization is terminated  or revoked sooner.       Influenza A by PCR NEGATIVE NEGATIVE Final   Influenza B by PCR NEGATIVE NEGATIVE Final    Comment: (NOTE) The Xpert Xpress SARS-CoV-2/FLU/RSV plus assay is intended as an aid in the diagnosis of influenza from Nasopharyngeal swab specimens and should not be used as a sole basis for treatment. Nasal washings and aspirates are unacceptable for Xpert Xpress SARS-CoV-2/FLU/RSV testing.  Fact Sheet for Patients: EntrepreneurPulse.com.au  Fact Sheet for Healthcare Providers: IncredibleEmployment.be  This  test is not yet approved or cleared by the [place] FDA and has been authorized for detection and/or diagnosis of SARS-CoV-2 by FDA under an Emergency Use Authorization (EUA). This EUA will remain in effect (meaning this test can be used) for the duration of the COVID-19 declaration under Section 564(b)(1) of the Act, 21 U.S.C. section 360bbb-3(b)(1), unless the authorization is terminated or revoked.  Performed at [facility], [place] [address], [place], [place] [address]      Time coordinating discharge: Over 30 minutes  SIGNED:   Little Ishikawa, DO [facility] 04/19/2021, 2:21 PM Pager   If 7PM-7AM, please contact night-coverage [email or web address]

## 2021-04-20 ENCOUNTER — Telehealth: Payer: Self-pay

## 2021-04-20 NOTE — Telephone Encounter (Cosign Needed)
Transition Care Management Follow-up Telephone Call  Date of discharge and from where: [facility] 04/19/21 moved to hospice, pt wife stated they don't need anything at this time and wanted to tell you hello.

## 2021-04-22 ENCOUNTER — Telehealth (HOSPITAL_COMMUNITY): Payer: Self-pay

## 2021-04-22 NOTE — Telephone Encounter (Signed)
Spoke to [employer] who reports Mr. Minor was transferred to [facility] after his discharge from the hospital and does not require paramedicine. Patient removed from list and discharged. Call complete.

## 2021-04-25 DEATH — deceased

## 2021-06-07 ENCOUNTER — Other Ambulatory Visit: Payer: Self-pay | Admitting: Family Medicine

## 2021-06-23 ENCOUNTER — Other Ambulatory Visit (HOSPITAL_COMMUNITY): Payer: Self-pay | Admitting: Cardiology
